# Patient Record
Sex: Female | Born: 1955 | Race: Black or African American
Health system: Southern US, Community
[De-identification: ages and names within clinical notes are randomized; demographics above are authoritative.]

## PROBLEM LIST (undated history)

## (undated) DIAGNOSIS — I739 Peripheral vascular disease, unspecified: Secondary | ICD-10-CM

## (undated) DIAGNOSIS — E669 Obesity, unspecified: Secondary | ICD-10-CM

## (undated) DIAGNOSIS — J449 Chronic obstructive pulmonary disease, unspecified: Secondary | ICD-10-CM

## (undated) DIAGNOSIS — I10 Essential (primary) hypertension: Secondary | ICD-10-CM

## (undated) DIAGNOSIS — E785 Hyperlipidemia, unspecified: Secondary | ICD-10-CM

## (undated) DIAGNOSIS — E119 Type 2 diabetes mellitus without complications: Secondary | ICD-10-CM

## (undated) HISTORY — DX: Peripheral vascular disease, unspecified: I73.9

## (undated) HISTORY — PX: TUNNELED CATH CHECK/CHANGE: IMG2663

## (undated) HISTORY — DX: Obesity, unspecified: E66.9

## (undated) HISTORY — PX: EXPLORATORY LAPAROTOMY: SHX4079

## (undated) HISTORY — PX: ESOPHAGOGASTRODUODENOSCOPY (EGD), BIOPSY: SHX3796

## (undated) HISTORY — PX: TUNNELED CATH PLACEMENT: IMG2664

## (undated) HISTORY — PX: JOINT REPLACEMENT: SHX530

## (undated) HISTORY — PX: OVARY SURGERY: SHX727

## (undated) HISTORY — PX: REMOVAL, FOREIGN BODY: SHX5112

---

## 2002-12-03 ENCOUNTER — Observation Stay
Admission: EM | Admit: 2002-12-03 | Disposition: A | Discharge status: Home / Self Care | Payer: Self-pay | Source: Emergency Department | Admitting: Internal Medicine

## 2003-09-06 ENCOUNTER — Emergency Department: Admit: 2003-09-06 | Payer: Self-pay | Source: Emergency Department | Admitting: Emergency Medicine

## 2005-03-21 ENCOUNTER — Emergency Department: Admit: 2005-03-21 | Payer: Self-pay | Source: Emergency Department | Admitting: Pediatric Emergency Medicine

## 2005-03-27 ENCOUNTER — Emergency Department: Admit: 2005-03-27 | Payer: Self-pay | Source: Emergency Department | Admitting: Emergency Medicine

## 2005-03-27 LAB — URINALYSIS WITH MICROSCOPIC
Bilirubin, UA: NEGATIVE
Blood, UA: NEGATIVE
Glucose, UA: >1000
Ketones UA: NEGATIVE
Leukocyte Esterase, UA: NEGATIVE
Nitrite, UA: NEGATIVE
Protein, UR: NEGATIVE
Specific Gravity UA POCT: 1.035 — ABNORMAL HIGH (ref 1.005–1.030)
Squamous Epithelial Cells, Urine: 2 /LPF — ABNORMAL HIGH (ref 0–0)
Urine pH: 6 (ref 4.6–8.0)
Urobilinogen, UA: 0.2 EU/dL (ref 0.2–1.0)
WBC, UA: 1 /HPF (ref 0–5)

## 2005-03-27 LAB — COMPREHENSIVE METABOLIC PANEL - AH CERNER
ALT: 18 U/L (ref 0–31)
AST (SGOT): 10 U/L (ref 0–31)
Albumin/Globulin Ratio: 1.4 (ref 1.1–2.2)
Albumin: 3.7 g/dL (ref 3.4–4.8)
Alkaline Phosphatase: 109 U/L — ABNORMAL HIGH (ref 35–104)
Anion Gap: 9 mEq/L (ref 5–15)
BUN: 11 mg/dL (ref 6–20)
Bilirubin, Total: 0.2 mg/dL (ref 0.0–1.0)
CA: 3.6 mEq/L — ABNORMAL LOW (ref 3.8–4.6)
CO2: 24.1 mEq/L (ref 22.0–29.0)
Calcium: 7.7 mg/dL — ABNORMAL LOW (ref 8.4–10.2)
Chloride: 100 mEq/L (ref 96–108)
Creatinine: 0.6 mg/dL (ref 0.4–1.1)
Globulin: 2.7 g/dL (ref 2.0–3.6)
Glucose: 367 mg/dL
Osmolality Calculated: 290 mosm/kg (ref 282–298)
Potassium: 4.1 mEq/L (ref 3.3–5.1)
Protein, Total: 6.4 g/dL (ref 6.4–8.3)
Sodium: 133 mEq/L — ABNORMAL LOW (ref 135–145)
UN/CREA SOFT: 18 RATIO (ref 6–33)

## 2005-03-27 LAB — CBC WITH AUTO DIFFERENTIAL CERNER
Basophils Absolute: 0 /mm3 (ref 0.0–0.2)
Basophils: 1 % (ref 0–2)
Eosinophils Absolute: 0.1 /mm3 (ref 0.0–0.2)
Eosinophils: 1 % (ref 0–5)
Granulocytes Absolute: 4 /mm3 (ref 1.8–8.1)
Hematocrit: 30.4 % — ABNORMAL LOW (ref 37.0–47.0)
Hgb: 10.1 G/DL — ABNORMAL LOW (ref 13.0–17.0)
Lymphocytes Absolute: 1.9 /mm3 (ref 0.5–4.4)
Lymphocytes: 29 % (ref 15–41)
MCH: 26.9 PG — ABNORMAL LOW (ref 28.0–32.0)
MCHC: 33.4 G/DL (ref 32.0–36.0)
MCV: 80.5 FL (ref 80.0–100.0)
MPV: 10.5 FL — ABNORMAL HIGH (ref 7.4–10.4)
Monocytes Absolute: 0.5 /mm3 (ref 0.0–1.2)
Monocytes: 8 % (ref 0–11)
Neutrophils %: 62 % (ref 52–75)
Platelets: 178 /mm3 (ref 140–400)
RBC: 3.77 /mm3 — ABNORMAL LOW (ref 4.20–5.40)
RDW: 13.8 % (ref 11.5–15.0)
WBC: 6.5 /mm3 (ref 3.5–10.8)

## 2005-03-27 LAB — RAPID INFLUENZA A/B ANTIGENS: Influenza Ag: NEGATIVE

## 2005-03-27 LAB — HEMOLYSIS INDEX: Hemolysis Index: 1 Units

## 2005-03-27 LAB — GFR

## 2005-07-10 ENCOUNTER — Emergency Department: Admit: 2005-07-10 | Payer: Self-pay | Source: Emergency Department | Admitting: Emergency Medicine

## 2005-07-10 LAB — BASIC METABOLIC PANEL - AH CERNER
Anion Gap: 11 mEq/L (ref 5–15)
BUN: 7 mg/dL (ref 6–20)
CO2: 25.8 mEq/L (ref 22.0–29.0)
Calcium: 8.6 mg/dL (ref 8.4–10.2)
Chloride: 99 mEq/L (ref 96–108)
Creatinine: 0.8 mg/dL (ref 0.4–1.1)
Glucose: 362 mg/dL
Osmolality Calculated: 295 mosm/kg (ref 282–298)
Potassium: 3.8 mEq/L (ref 3.3–5.1)
Sodium: 136 mEq/L (ref 135–145)
UN/CREA SOFT: 9 RATIO (ref 6–33)

## 2005-07-10 LAB — CBC WITH AUTO DIFFERENTIAL CERNER
Basophils Absolute: 0 /mm3 (ref 0.0–0.2)
Basophils: 1 % (ref 0–2)
Eosinophils Absolute: 0 /mm3 (ref 0.0–0.2)
Eosinophils: 1 % (ref 0–5)
Granulocytes Absolute: 4 /mm3 (ref 1.8–8.1)
Hematocrit: 36.8 % — ABNORMAL LOW (ref 37.0–47.0)
Hgb: 11.8 G/DL — ABNORMAL LOW (ref 12.0–16.0)
Lymphocytes Absolute: 1.3 /mm3 (ref 0.5–4.4)
Lymphocytes: 23 % (ref 15–41)
MCH: 25.9 PG — ABNORMAL LOW (ref 28.0–32.0)
MCHC: 32 G/DL (ref 32.0–36.0)
MCV: 80.8 FL (ref 80.0–100.0)
MPV: 10.8 FL — ABNORMAL HIGH (ref 7.4–10.4)
Monocytes Absolute: 0.4 /mm3 (ref 0.0–1.2)
Monocytes: 7 % (ref 0–11)
Neutrophils %: 69 % (ref 52–75)
Platelets: 191 /mm3 (ref 140–400)
RBC: 4.55 /mm3 (ref 4.20–5.40)
RDW: 14.9 % (ref 11.5–15.0)
WBC: 5.7 /mm3 (ref 3.5–10.8)

## 2005-07-10 LAB — LIPASE: Lipase: 30 U/L (ref 13–60)

## 2005-07-10 LAB — AMYLASE: Amylase: 46 U/L (ref 20–148)

## 2005-07-10 LAB — HCG QUANTITATIVE: hCG, Quant.: <0.1 m[IU]/mL (ref 0–9)

## 2005-07-10 LAB — HEMOLYSIS INDEX: Hemolysis Index: 32 Units

## 2005-07-10 LAB — GFR

## 2005-12-17 ENCOUNTER — Emergency Department: Admit: 2005-12-17 | Payer: Self-pay | Source: Emergency Department | Admitting: Emergency Medicine

## 2005-12-17 LAB — URINALYSIS WITH MICROSCOPIC
Bilirubin, UA: NEGATIVE
Blood, UA: NEGATIVE
Glucose, UA: 500
Leukocyte Esterase, UA: NEGATIVE
Nitrite, UA: NEGATIVE
Protein, UR: NEGATIVE
RBC, UA: 5 /HPF — ABNORMAL HIGH (ref 0–3)
Specific Gravity UA POCT: 1.033 — ABNORMAL HIGH (ref 1.005–1.030)
Squamous Epithelial Cells, Urine: 8 /LPF — ABNORMAL HIGH (ref 0–0)
Urine pH: 5 (ref 4.6–8.0)
Urobilinogen, UA: 2 EU/dL — ABNORMAL HIGH (ref 0.2–1.0)
WBC, UA: 3 /HPF (ref 0–5)

## 2005-12-17 LAB — CBC WITH AUTO DIFFERENTIAL CERNER
Basophils Absolute: 0 /mm3 (ref 0.0–0.2)
Basophils: 0 % (ref 0–2)
Eosinophils Absolute: 0.1 /mm3 (ref 0.0–0.2)
Eosinophils: 1 % (ref 0–5)
Granulocytes Absolute: 3.6 /mm3 (ref 1.8–8.1)
Hematocrit: 32.8 % — ABNORMAL LOW (ref 37.0–47.0)
Hgb: 11.6 G/DL — ABNORMAL LOW (ref 12.0–16.0)
Lymphocytes Absolute: 1.9 /mm3 (ref 0.5–4.4)
Lymphocytes: 31 % (ref 15–41)
MCH: 30.1 PG (ref 28.0–32.0)
MCHC: 35.3 G/DL (ref 32.0–36.0)
MCV: 85.3 FL (ref 80.0–100.0)
MPV: 11 FL — ABNORMAL HIGH (ref 7.4–10.4)
Monocytes Absolute: 0.6 /mm3 (ref 0.0–1.2)
Monocytes: 9 % (ref 0–11)
Neutrophils %: 59 % (ref 52–75)
Platelets: 205 /mm3 (ref 140–400)
RBC: 3.85 /mm3 — ABNORMAL LOW (ref 4.20–5.40)
RDW: 13.5 % (ref 11.5–15.0)
WBC: 6.1 /mm3 (ref 3.5–10.8)

## 2005-12-17 LAB — WET PREP, AH CERNER

## 2005-12-17 LAB — GFR

## 2005-12-17 LAB — BASIC METABOLIC PANEL - AH CERNER
Anion Gap: 13 mEq/L (ref 5–15)
BUN: 12 mg/dL (ref 6–20)
CO2: 22.8 mEq/L (ref 22.0–29.0)
Calcium: 8.9 mg/dL (ref 8.4–10.2)
Chloride: 99 mEq/L (ref 96–108)
Creatinine: 0.8 mg/dL (ref 0.4–1.1)
Glucose: 247 mg/dL
Osmolality Calculated: 288 mosm/kg (ref 282–298)
Potassium: 4.3 mEq/L (ref 3.3–5.1)
Sodium: 135 mEq/L (ref 135–145)
UN/CREA SOFT: 15 RATIO (ref 6–33)

## 2005-12-17 LAB — HEMOLYSIS INDEX: Hemolysis Index: 82 Units

## 2006-03-18 ENCOUNTER — Observation Stay
Admission: AD | Admit: 2006-03-18 | Disposition: A | Discharge status: Home / Self Care | Payer: Self-pay | Source: Emergency Department | Admitting: Internal Medicine

## 2006-03-18 LAB — COMPREHENSIVE METABOLIC PANEL
ALT: 25 U/L (ref 9–52)
AST (SGOT): 18 U/L (ref 8–39)
Albumin/Globulin Ratio: 1.2 (ref 1.1–1.8)
Albumin: 3.4 G/DL — ABNORMAL LOW (ref 3.7–5.1)
Alkaline Phosphatase: 88 U/L (ref 43–122)
BUN: 9 MG/DL (ref 7–21)
Bilirubin, Total: 0.2 MG/DL (ref 0.2–1.3)
CO2: 25 MEQ/L (ref 22–31)
Calcium: 8.6 MG/DL (ref 8.6–10.2)
Chloride: 104 MEQ/L (ref 98–107)
Creatinine: 0.6 MG/DL (ref 0.5–1.4)
Globulin: 2.9 G/DL (ref 2.0–3.7)
Glucose: 298 MG/DL — ABNORMAL HIGH (ref 70–105)
Potassium: 3.6 MEQ/L (ref 3.6–5.0)
Protein, Total: 6.3 G/DL (ref 6.0–8.0)
Sodium: 138 MEQ/L (ref 136–143)

## 2006-03-18 LAB — CBC WITH AUTO DIFFERENTIAL CERNER
Basophils Absolute: 0 /mm3 (ref 0.0–0.2)
Basophils: 1 % (ref 0–2)
Eosinophils Absolute: 0 /mm3 (ref 0.0–0.7)
Eosinophils: 1 % (ref 0–5)
Granulocytes Absolute: 2.2 /mm3 (ref 1.8–8.1)
Hematocrit: 30.1 % — ABNORMAL LOW (ref 37.0–47.0)
Hgb: 9.8 G/DL — ABNORMAL LOW (ref 13.0–17.0)
Lymphocytes Absolute: 1.3 /mm3 (ref 0.5–4.4)
Lymphocytes: 34 % (ref 15–41)
MCH: 25 PG — ABNORMAL LOW (ref 28.0–32.0)
MCHC: 32.5 G/DL (ref 32.0–36.0)
MCV: 76.8 FL — ABNORMAL LOW (ref 80.0–100.0)
MPV: 9.9 FL (ref 7.4–10.4)
Monocytes Absolute: 0.3 /mm3 (ref 0.0–1.2)
Monocytes: 9 % (ref 0–11)
Neutrophils %: 56 % (ref 52–75)
Platelets: 195 /mm3 (ref 140–400)
RBC: 3.92 /mm3 — ABNORMAL LOW (ref 4.20–5.40)
RDW: 13.9 % (ref 11.5–15.0)
WBC: 3.9 /mm3 (ref 3.5–10.8)

## 2006-03-18 LAB — HEPATIC FUNCTION PANEL
ALT: 18 U/L (ref 9–52)
AST (SGOT): 17 U/L (ref 8–39)
Albumin/Globulin Ratio: 1.1 (ref 1.1–1.8)
Albumin: 3.4 G/DL — ABNORMAL LOW (ref 3.7–5.1)
Alkaline Phosphatase: 90 U/L (ref 43–122)
Bilirubin Direct: 0 MG/DL — AB (ref 0.0–0.3)
Bilirubin Indirect: 0.2 MG/DL (ref 0.0–1.1)
Bilirubin, Total: 0.2 MG/DL (ref 0.2–1.3)
Globulin: 3 G/DL (ref 2.0–3.7)
Protein, Total: 6.4 G/DL (ref 6.0–8.0)

## 2006-03-18 LAB — GFR

## 2006-03-18 LAB — TROPONIN I QUANTITATIVE LEVEL CERNER: Troponin I: 0.01 ng/mL (ref 0.00–0.03)

## 2006-03-18 LAB — CREATINE KINASE W/O REFLEX (SOFT): Creatine Kinase (CK): 84 U/L (ref 20–140)

## 2006-03-18 LAB — PT AND APTT
PT INR: 0.9 {INR} (ref 0.9–1.1)
PT: 11.3 s (ref 10.8–13.3)
PTT: 25 s (ref 21–32)

## 2006-03-18 LAB — CALCIUM IONIZED-CALC. CERNER: Calcium Ionized Calculated: 2 mEQ/L (ref 1.9–2.3)

## 2006-03-18 LAB — URINE HCG QUALITATIVE: Urine HCG Qualitative: NEGATIVE

## 2006-03-18 LAB — AMYLASE: Amylase: 29 U/L — ABNORMAL LOW (ref 30–110)

## 2006-03-18 LAB — LIPASE: Lipase: 113 U/L (ref 23–300)

## 2006-03-20 LAB — BASIC METABOLIC PANEL
BUN: 9 MG/DL (ref 7–21)
CO2: 24 MEQ/L (ref 22–31)
Calcium: 8.1 MG/DL — ABNORMAL LOW (ref 8.6–10.2)
Chloride: 106 MEQ/L (ref 98–107)
Creatinine: 0.6 MG/DL (ref 0.5–1.4)
Glucose: 190 MG/DL — ABNORMAL HIGH (ref 70–105)
Potassium: 3.6 MEQ/L (ref 3.6–5.0)
Sodium: 136 MEQ/L (ref 136–143)

## 2006-03-20 LAB — CBC- CERNER
Hematocrit: 30.2 % — ABNORMAL LOW (ref 37.0–47.0)
Hgb: 9.8 G/DL — ABNORMAL LOW (ref 13.0–17.0)
MCH: 25 PG — ABNORMAL LOW (ref 28.0–32.0)
MCHC: 32.4 G/DL (ref 32.0–36.0)
MCV: 77.2 FL — ABNORMAL LOW (ref 80.0–100.0)
MPV: 9.5 FL (ref 7.4–10.4)
Platelets: 205 /mm3 (ref 140–400)
RBC: 3.91 /mm3 — ABNORMAL LOW (ref 4.20–5.40)
RDW: 13.9 % (ref 11.5–15.0)
WBC: 6.3 /mm3 (ref 3.5–10.8)

## 2006-03-20 LAB — GFR

## 2007-12-15 ENCOUNTER — Emergency Department: Payer: Self-pay

## 2008-10-27 ENCOUNTER — Ambulatory Visit: Payer: Self-pay

## 2008-11-06 ENCOUNTER — Ambulatory Visit: Payer: Self-pay | Admitting: Adult Health

## 2008-12-02 ENCOUNTER — Ambulatory Visit: Payer: Self-pay

## 2009-03-29 ENCOUNTER — Emergency Department: Payer: Self-pay | Admitting: Emergency Medicine

## 2009-05-05 ENCOUNTER — Emergency Department: Payer: Self-pay | Admitting: Emergency Medicine

## 2009-06-14 ENCOUNTER — Ambulatory Visit: Payer: Self-pay | Admitting: Family Medicine

## 2010-01-06 ENCOUNTER — Emergency Department: Payer: Self-pay | Admitting: Emergency Medicine

## 2010-10-29 LAB — ECG 12-LEAD
Atrial Rate: 56 {beats}/min
P Axis: 62 degrees
P-R Interval: 128 ms
Q-T Interval: 520 ms
QRS Duration: 82 ms
QTC Calculation (Bezet): 501 ms
R Axis: 44 degrees
T Axis: 28 degrees
Ventricular Rate: 56 {beats}/min
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
## 2010-11-10 NOTE — Consults (Signed)
ATTENDING MD:      Gary Fleet MD: Lissa Morales, MD      ADMITTED:      03/18/2006      CONSULTED:     03/19/2006      ROOM:          3B  335 02            REASON FOR CONSULTATION:  Acute vertigo.            HISTORY OF PRESENT ILLNESS:  This patient is a 55 year old right-handed      African-American female with a past medical history of type 2 diabetes for      several years, history of hypertension, admitted with 2 days of vertigo,      described as spinning associated with head changes, worsened with      positional changes, associated with nausea.  The patient states that it is      not getting better.  She denies any headache, denies passing out, reports      paresthesias in her lower extremities for some time.  The patient denies      recent illness but admits some diarrhea.  There is also a history of chest      pain off and on, some questionable heart disease.            PAST MEDICAL HISTORY:  Significant for diabetes mellitus, hypertension.            SOCIAL HISTORY:  Denies smoking, drinking.            FAMILY HISTORY:  Significant for diabetes mellitus.            MEDICATIONS:  The patient is on meclizine and metformin, phenergan.            PHYSICAL EXAMINATION:  Her blood pressure is 112/61, pulse is 61.      Neurological examination:  Patient oriented x3.  She denies recent and      remote memory problems.  Denies any cognitive issues.  She has good      comprehension and concentration.  Able to register and name well.  Cranial      nerves examination, 2 mm and reactive.  Extraocular movements intact.      Visual fields are full.  Face symmetric. Tongue and uvula are midline.      Sensation intact.  Motor strength is 5/5 except in the iliopsoas, lower      extremity 4/5, and subtle weakness in the rest of the muscles in the lower      extremities.  Sensory intact to pinprick, temperature, vibration,      proprioception.  Finger-to-nose, not abnormal, no ataxia.  The patient has      difficulty to walk  because of dizziness.  Reflexes are 1 to 2+ and      symmetric.  Ankle reflexes are depressed.            DIAGNOSTIC STUDIES:  A CT scan of the brain showed that she has some      possible pneumocephalus either from artifactual or air in the venous      structure which would make it of no clinical significance.  Followup      examination is suggested.  Laboratory workup shows that she has WBC of 3.9,      hemoglobin of 9.8, hematocrit of 30.1.  Sodium 138, potassium 3.6, chloride      104, CO2 of 25, BUN and  creatinine is 9 and 0.6, glucose is 298.  ALT and      AST is 88 and 18.  Lipase is 139.  INR, PT, PTT is normal.  Head CT is      negative.            ASSESSMENT AND PLAN:  This is a 55 year old female with a past medical      history of diabetes, hypertension.  Patient presented with 2 days of acute      onset of vertigo described as spinning associated with positional changes,      associated with nausea, denies passing out, admits to some paresthesias in      the lower extremities for some time.  The patient also reports some      diarrhea.            IMPRESSION:  Benign positional vertigo versus vertebrobasilar      insufficiency.  We will do workup for a stroke.  We will get an MRI and MRA      of the head and neck to rule out ischemic structure lesion, looking at      vertebrobasilar circulation.  We will check thyroid stimulating hormone, B      12, and rapid plasma reagin.  Also, we will get physical therapy evaluation      and an echocardiogram, Holter monitoring.  We will put her on aspirin and      have a workup for her cardiac problems.  We will continue with deep venous      thrombosis prophylaxis and will get a lipid profile, also start a stroke      workup.  We will continue to follow.            Thanks for giving [place] an opportunity to participate in the care of this      patient.                                          Electronic Signing MD: Lissa Morales, MD                  D 03/19/2006  1:01 P;  T 03/19/2006  1:51 P; [id number] - - , [id number], [id number]      CC:  Lissa Morales, MD

## 2010-12-06 ENCOUNTER — Emergency Department: Payer: Self-pay | Admitting: Emergency Medicine

## 2011-10-12 ENCOUNTER — Emergency Department: Payer: Self-pay | Admitting: Emergency Medicine

## 2011-10-12 LAB — BASIC METABOLIC PANEL
Calcium, Total: 9.1 mg/dL (ref 8.5–10.1)
Chloride: 106 mmol/L (ref 98–107)
Co2: 28 mmol/L (ref 21–32)
EGFR (Non-African Amer.): 47 — ABNORMAL LOW
Osmolality: 289 (ref 275–301)
Potassium: 4.3 mmol/L (ref 3.5–5.1)
Sodium: 141 mmol/L (ref 136–145)

## 2011-10-12 LAB — CBC
HCT: 33 % — ABNORMAL LOW (ref 35.0–47.0)
HGB: 11.1 g/dL — ABNORMAL LOW (ref 12.0–16.0)
MCHC: 33.6 g/dL (ref 32.0–36.0)
Platelet: 156 10*3/uL (ref 150–440)
RBC: 3.75 10*6/uL — ABNORMAL LOW (ref 3.80–5.20)

## 2011-11-27 ENCOUNTER — Ambulatory Visit: Payer: Self-pay | Admitting: Internal Medicine

## 2011-12-20 ENCOUNTER — Emergency Department: Payer: Self-pay | Admitting: Emergency Medicine

## 2012-01-16 NOTE — Procedures (Unsigned)
PATIENT:          [name], [name]      CARDIOLOGIST: Zeinab Rodwell. Clarita Crane, MD      REFERRING PHYSICIAN:      MEDICAL RECORD NUMBER:    [id number]      DATE OF STUDY:    12/04/2002      PATIENT LOCATION/SERVICE: [facility] 12/04/2002                  AGE:                                   PREVIOUS STUDY:      DRUGS:      INDICATIONS FOR STUDY:      BASELINE EKG:      PREDICTED MAXIMUM HR:              STAGE    MAXIMUM      TIME        HR        BP     DYSRHYTHMIA ST CHANGES                 WORKLOAD   MINUTES                             S      I      II      III      IV      V      VI      RECOVERY      I      II      III            SYMPTOMS:      TEST TERMINATED DUE TO:      PEAK HEART     AFTER           MINUTES @       MPH  _______%   GRADE      RATE                                                           =_______METS                  PROCEDURE AND FINDINGS:  The patient underwent an exercise treadmill test.      She exercised for 8 minutes, 24 seconds.  The patient experienced chest      pain with 1 mm ST depression in V4, V5, and V6.  The patient achieved 10.1      METS.  She almost reached her target heart rate.            IMPRESSION:  Positive exercise treadmill stress test                  ___________________________________      Jerilynn Mages. Clarita Crane, MD            Keithon Mccoin Z:amg:addk      D:    12/04/2002      T:    12/04/2002      #:    [id number]      N:  [id number]            cc:   Peterson Mathey. Clarita Crane, MD

## 2012-01-16 NOTE — Procedures (Unsigned)
PATIENTLACHANDRA, [name]      MEDICAL RECORD NUMBER:         [id number]      PATIENT LOCATION/SERVICE:      [facility] 12/04/2002            DATE OF PROCEDURE:             12/04/2002      PHYSICIAN:                     Vonzell Schlatter, MD      REFERRING PHYSICIAN:            CLINICAL INDICATIONS:                                            [name]            3.8  cm      LA                                  (19-71mm)      3.4  cm      Ao Root                                   (19-27mm)      1.9  cm      Ao Leaflet Separation               (15-31mm)      ---   cm     RVd                                 (7-29mm)      2.7  cm      LVs                                 (20-4mm)      4.2  cm      LVd                                 (37-20mm)      0.8  cm      IVSd                                (6-77mm)      0.9  cm      LVPWd                               (6-52mm)      ---          Fractional Shortening               (25-45%)      65%          Est. EF                             (>  55%)            PEAK VELOCITY          PEAK              DOPPLER      DEGREE OF VALVULAR      VALVE                          GRADIENT         MEAN             REGURGITATION      AREA                                           GRADIENT      Mitral 0.8  [name]/s(0.6-1.3)                           Trace      Aortic  1.2  [name]/s(1.0-1.7)                              -------      Pulm.   1.2  [name]/s(0.6-0.9)                          -------      Tric.   0.5  [name]/s(0.3-0.7)                              Trace      LVOT   0.9  [name]/s(0.7-1.7)            DESCRIPTION OF FINDINGS:  The left ventricular chamber size is normal.  The      left ventricular systolic function is normal.  Ejection fraction is 65%.      The mitral valve shows trace regurgitation.  There is no evidence of mitral      stenosis.  The aortic valve appears to be normal.  The tricuspid valve      shows trace regurgitation.  The left atrial size is normal.  Right atrial      and  right ventricular sizes are normal.  There is no pericardial effusion.            CONCLUSIONS:      1. Normal cardiac chamber sizes.      2. Normal left ventricular systolic function.      3. Ejection fraction is 65%.      4. Trace mitral regurgitation.      5. Trace tricuspid regurgitation.            ___________________________________      Diane Mages. Diane Crane, MD            Suprina Mandeville Z:amw:MH      D:      12/04/2002      T:      12/05/2002      #:      [id number]      N:      [id number]            cc:     Voncille Lo, MD  Vonzell Schlatter, MD

## 2012-04-03 ENCOUNTER — Ambulatory Visit: Payer: Self-pay

## 2012-05-06 ENCOUNTER — Ambulatory Visit: Payer: Self-pay | Admitting: Family Medicine

## 2012-11-28 ENCOUNTER — Ambulatory Visit: Payer: Self-pay | Admitting: Family Medicine

## 2012-12-18 ENCOUNTER — Emergency Department: Payer: Self-pay | Admitting: Emergency Medicine

## 2012-12-18 LAB — COMPREHENSIVE METABOLIC PANEL
Anion Gap: 9 (ref 7–16)
BUN: 35 mg/dL — ABNORMAL HIGH (ref 7–18)
Bilirubin,Total: 0.6 mg/dL (ref 0.2–1.0)
Calcium, Total: 9.1 mg/dL (ref 8.5–10.1)
Chloride: 103 mmol/L (ref 98–107)
Creatinine: 1.73 mg/dL — ABNORMAL HIGH (ref 0.60–1.30)
EGFR (African American): 37 — ABNORMAL LOW
Glucose: 162 mg/dL — ABNORMAL HIGH (ref 65–99)
Osmolality: 287 (ref 275–301)
SGOT(AST): 21 U/L (ref 15–37)
Sodium: 138 mmol/L (ref 136–145)

## 2012-12-18 LAB — URINALYSIS, COMPLETE
Glucose,UR: 50 mg/dL (ref 0–75)
Ketone: NEGATIVE
Nitrite: NEGATIVE
Protein: 100
RBC,UR: 117 /HPF (ref 0–5)
Specific Gravity: 1.01 (ref 1.003–1.030)

## 2012-12-18 LAB — CBC
HCT: 34.6 % — ABNORMAL LOW (ref 35.0–47.0)
HGB: 11.2 g/dL — ABNORMAL LOW (ref 12.0–16.0)
MCH: 27.6 pg (ref 26.0–34.0)
MCHC: 32.3 g/dL (ref 32.0–36.0)
Platelet: 145 10*3/uL — ABNORMAL LOW (ref 150–440)
RBC: 4.05 10*6/uL (ref 3.80–5.20)
WBC: 11.3 10*3/uL — ABNORMAL HIGH (ref 3.6–11.0)

## 2012-12-18 LAB — CK TOTAL AND CKMB (NOT AT ARMC)
CK, Total: 100 U/L (ref 21–215)
CK-MB: <0.5 ng/mL — ABNORMAL LOW (ref 0.5–3.6)

## 2012-12-18 LAB — TROPONIN I: Troponin-I: <0.02 ng/mL

## 2012-12-18 LAB — LIPASE, BLOOD: Lipase: 179 U/L (ref 73–393)

## 2013-02-10 LAB — BASIC METABOLIC PANEL
Anion Gap: 7 (ref 7–16)
BUN: 20 mg/dL — AB (ref 7–18)
CALCIUM: 8.7 mg/dL (ref 8.5–10.1)
CO2: 24 mmol/L (ref 21–32)
CREATININE: 1.58 mg/dL — AB (ref 0.60–1.30)
Chloride: 106 mmol/L (ref 98–107)
GFR CALC AF AMER: 41 — AB
GFR CALC NON AF AMER: 36 — AB
Glucose: 169 mg/dL — ABNORMAL HIGH (ref 65–99)
Osmolality: 280 (ref 275–301)
Potassium: 3.5 mmol/L (ref 3.5–5.1)
SODIUM: 137 mmol/L (ref 136–145)

## 2013-02-10 LAB — TROPONIN I: Troponin-I: 0.07 ng/mL — ABNORMAL HIGH

## 2013-02-11 ENCOUNTER — Inpatient Hospital Stay: Payer: Self-pay | Admitting: Internal Medicine

## 2013-02-11 LAB — CBC
HCT: 34.6 % — AB (ref 35.0–47.0)
HGB: 11.2 g/dL — AB (ref 12.0–16.0)
MCH: 28.7 pg (ref 26.0–34.0)
MCHC: 32.5 g/dL (ref 32.0–36.0)
MCV: 88 fL (ref 80–100)
PLATELETS: 168 10*3/uL (ref 150–440)
RBC: 3.91 10*6/uL (ref 3.80–5.20)
RDW: 13.6 % (ref 11.5–14.5)
WBC: 4.3 10*3/uL (ref 3.6–11.0)

## 2013-02-11 LAB — RAPID INFLUENZA A&B ANTIGENS (ARMC ONLY)

## 2013-02-11 LAB — CK-MB
CK-MB: 0.5 ng/mL (ref 0.5–3.6)
CK-MB: <0.5 ng/mL — ABNORMAL LOW (ref 0.5–3.6)

## 2013-02-11 LAB — TROPONIN I
TROPONIN-I: 0.07 ng/mL — AB
Troponin-I: 0.08 ng/mL — ABNORMAL HIGH

## 2013-02-11 LAB — PRO B NATRIURETIC PEPTIDE: B-Type Natriuretic Peptide: 914 pg/mL — ABNORMAL HIGH (ref 0–125)

## 2013-02-12 LAB — LIPID PANEL
Cholesterol: 238 mg/dL — ABNORMAL HIGH (ref 0–200)
HDL: 40 mg/dL (ref 40–60)
Ldl Cholesterol, Calc: 143 mg/dL — ABNORMAL HIGH (ref 0–100)
TRIGLYCERIDES: 273 mg/dL — AB (ref 0–200)
VLDL Cholesterol, Calc: 55 mg/dL — ABNORMAL HIGH (ref 5–40)

## 2013-02-14 LAB — CBC WITH DIFFERENTIAL/PLATELET
BASOS ABS: 0 10*3/uL (ref 0.0–0.1)
Basophil %: 0 %
EOS ABS: 0 10*3/uL (ref 0.0–0.7)
Eosinophil %: 0 %
HCT: 29.8 % — ABNORMAL LOW (ref 35.0–47.0)
HGB: 9.7 g/dL — AB (ref 12.0–16.0)
LYMPHS ABS: 1 10*3/uL (ref 1.0–3.6)
LYMPHS PCT: 7.2 %
MCH: 28 pg (ref 26.0–34.0)
MCHC: 32.4 g/dL (ref 32.0–36.0)
MCV: 86 fL (ref 80–100)
MONO ABS: 0.6 x10 3/mm (ref 0.2–0.9)
MONOS PCT: 4.5 %
Neutrophil #: 11.8 10*3/uL — ABNORMAL HIGH (ref 1.4–6.5)
Neutrophil %: 88.3 %
Platelet: 193 10*3/uL (ref 150–440)
RBC: 3.45 10*6/uL — ABNORMAL LOW (ref 3.80–5.20)
RDW: 13.6 % (ref 11.5–14.5)
WBC: 13.4 10*3/uL — AB (ref 3.6–11.0)

## 2013-02-14 LAB — BASIC METABOLIC PANEL
ANION GAP: 6 — AB (ref 7–16)
BUN: 52 mg/dL — ABNORMAL HIGH (ref 7–18)
CALCIUM: 9 mg/dL (ref 8.5–10.1)
CHLORIDE: 101 mmol/L (ref 98–107)
CO2: 24 mmol/L (ref 21–32)
Creatinine: 1.94 mg/dL — ABNORMAL HIGH (ref 0.60–1.30)
EGFR (African American): 32 — ABNORMAL LOW
GFR CALC NON AF AMER: 28 — AB
Glucose: 327 mg/dL — ABNORMAL HIGH (ref 65–99)
Osmolality: 289 (ref 275–301)
Potassium: 4.7 mmol/L (ref 3.5–5.1)
SODIUM: 131 mmol/L — AB (ref 136–145)

## 2013-02-15 LAB — BASIC METABOLIC PANEL
ANION GAP: 6 — AB (ref 7–16)
BUN: 65 mg/dL — ABNORMAL HIGH (ref 7–18)
CO2: 23 mmol/L (ref 21–32)
Calcium, Total: 8.9 mg/dL (ref 8.5–10.1)
Chloride: 102 mmol/L (ref 98–107)
Creatinine: 1.99 mg/dL — ABNORMAL HIGH (ref 0.60–1.30)
EGFR (African American): 31 — ABNORMAL LOW
GFR CALC NON AF AMER: 27 — AB
GLUCOSE: 339 mg/dL — AB (ref 65–99)
Osmolality: 295 (ref 275–301)
POTASSIUM: 4.9 mmol/L (ref 3.5–5.1)
Sodium: 131 mmol/L — ABNORMAL LOW (ref 136–145)

## 2013-02-15 LAB — CBC WITH DIFFERENTIAL/PLATELET
Basophil #: 0 10*3/uL (ref 0.0–0.1)
Basophil %: 0.1 %
Eosinophil #: 0 10*3/uL (ref 0.0–0.7)
Eosinophil %: 0 %
HCT: 29.9 % — ABNORMAL LOW (ref 35.0–47.0)
HGB: 9.5 g/dL — AB (ref 12.0–16.0)
LYMPHS ABS: 0.6 10*3/uL — AB (ref 1.0–3.6)
Lymphocyte %: 5.1 %
MCH: 27.9 pg (ref 26.0–34.0)
MCHC: 32 g/dL (ref 32.0–36.0)
MCV: 87 fL (ref 80–100)
MONOS PCT: 3.4 %
Monocyte #: 0.4 x10 3/mm (ref 0.2–0.9)
NEUTROS ABS: 10.8 10*3/uL — AB (ref 1.4–6.5)
Neutrophil %: 91.4 %
Platelet: 194 10*3/uL (ref 150–440)
RBC: 3.42 10*6/uL — ABNORMAL LOW (ref 3.80–5.20)
RDW: 13.7 % (ref 11.5–14.5)
WBC: 11.8 10*3/uL — ABNORMAL HIGH (ref 3.6–11.0)

## 2013-02-17 LAB — CREATININE, SERUM
Creatinine: 1.95 mg/dL — ABNORMAL HIGH (ref 0.60–1.30)
EGFR (African American): 32 — ABNORMAL LOW
EGFR (Non-African Amer.): 28 — ABNORMAL LOW

## 2013-02-17 LAB — WBC: WBC: 10.7 10*3/uL (ref 3.6–11.0)

## 2013-02-18 LAB — PLATELET COUNT: Platelet: 205 10*3/uL (ref 150–440)

## 2013-04-02 ENCOUNTER — Ambulatory Visit: Payer: Self-pay | Admitting: Nephrology

## 2014-05-16 NOTE — Discharge Summary (Signed)
PATIENT NAME:  Donna Nelson, Donna Nelson MR#:  [id number] DATE OF BIRTH:  19-Apr-1955  DATE OF ADMISSION:  02/11/2013 DATE OF DISCHARGE:  02/19/2013   DISCHARGE DIAGNOSES:  1. Chest pain secondary to bronchitis.  2. Chronic obstructive pulmonary disease exacerbation.  3. Diabetes mellitus type 2, uncontrolled blood sugar secondary to steroids.  4. Hypertension.  5. Chronic kidney disease stage III.  6. The patient needs outpatient stress test because of chest pain and other risk factors.   DISCHARGE MEDICATIONS:  1. Aspirin 81 mg daily.  2. Glipizide 10 mg 2 tablets p.o. b.i.d. 3. Gemfibrozil 600 mg 1 tablet p.o. b.i.d.  4. Omeprazole 20 mg p.o. b.i.d. 5. Lovastatin 40 mg p.o. daily. 6. Hydrochlorothiazide 25 mg p.o. daily.  7. ProAir 2 puffs every 4 to 6 hours as needed. 8. Combigan 2 drops in affected eye every 12 hours.  9. Hydroxyzine hydrochloride 25 mg every 4 to 6 hours as needed.  10. Lasix 20 mg p.o. daily.  11. Insulin Lantus 105 units subcutaneous at bedtime.  12. Prednisone 20 mg 3 tablets daily for 2 days, 2 tablets daily for 2 days and 1 tablet daily for 2 days.  13. Azithromycin 500 mg daily for 4 days.  14. Tussionex 5 mg every 12 hours, dispense only 70 mL.   15. Amlodipine 10 mg p.o. daily.  16. DuoNebs every 6 hours p.r.n. for wheezing.  17. Augmentin 875 mg p.o. b.i.d. for 10 days.   CONSULTATIONS: None.   HOSPITAL COURSE:  1. Chest pain. The patient is a 59 year old female with multiple medical problems of diabetes, hypertension, hyperlipidemia, GERD, who came in because of chest pain. The patient's primary doctor is Dr. Darrick Huntsmanullo. The patient admitted to observation initially, started on aspirin, beta blockers. The patient had a stress test which showed no significant ischemia, and the patient's nuclear medicine scan did not show any abnormality. LDL was 143. She was started already on statins along with gemfibrozil as her triglycerides were 273. The patient is to continue  her home medications for her blood pressure, including Lasix, Norvasc and also her hydrochlorothiazide along with aspirin.  2. Diabetes mellitus type 2. The patient's sugars have been high because of the steroids. The patient is right now on a little bit higher dose of Lantus at 105 units a day at night, so I told her to check blood sugars at home, and probably she can call the nurse at Dr. Melina Schoolsullo's office and give the readings and adjust the insulin accordingly.  3. Chronic obstructive pulmonary disease exacerbation and bronchitis. The patient's chest x-rays repeatedly were negative for pneumonia, but she had a very bad cough, and the patient continued on IV steroids along with antibiotics. O2 saturations are like 98% on room air. The patient does have a really significant cough, especially when she talks, so I told her to continue Tussionex and tapering course of prednisone. She has a nebulizer that she can continue, and see Dr. Darrick Huntsmanullo for followup.  4. The patient does have CKD stage III, baseline creatinine is around 1.8. She is supposed to see Dr. Mady HaagensenMunsoor Lateef, and she can follow up with them.  5. The patient has still elevated blood sugar of 340s, but I did decrease the steroids, so hopefully the extra dose of Lantus will help.   TIME SPENT ON DISCHARGE PREPARATION: Took more than 30 minutes.   ____________________________ Katha HammingSnehalatha Jeniece Hannis, MD sk:lb D: 02/19/2013 12:05:05 ET T: 02/19/2013 12:34:24 ET JOB#: [id number]  cc: Katha HammingSnehalatha Islam Villescas, MD, <Dictator>  Duncan Dull, MD Katha Hamming MD ELECTRONICALLY SIGNED 03/03/2013 15:21

## 2014-05-16 NOTE — H&P (Signed)
PATIENT NAME:  Donna Nelson, Donna Nelson MR#:  [id number] DATE OF BIRTH:  03-26-1955  DATE OF ADMISSION:  02/10/2013  REFERRING PHYSICIAN: Dr. Bayard Males.   PRIMARY CARE PHYSICIAN: Dr. Darrick Huntsman   CHIEF COMPLAINT:  Chest pain.    HISTORY OF PRESENT ILLNESS:  A 59 year old African American female with past medical history of hypertension, type 2 diabetes insulin requiring, hyperlipidemia, gastroesophageal reflux disease presenting with chest pain. She describes 1 day duration of chest pain, sharp and burning in quality, retrosternal in location, nonradiating, 7 out of 10 in intensity, worse with cough. She found no relieving factors. No associated shortness of breath, palpitations, diaphoresis or nauseousness.  The patient also of note, has had shortness of breath for approximately 1 week in duration described mainly as dyspnea on exertion with productive cough and greenish sputum. Denies fevers, chills. No known sick contacts. Denies any orthopnea, edema, PND. She received aspirin in the Emergency Department, and started on supplemental O2. Currently without complaints.   REVIEW OF SYSTEMS:  CONSTITUTIONAL: Denies fever. Positive for generalized fatigue and weakness. Denies pain.  EYES: Denies blurred vision, double vision, eye pain.  EARS, NOSE, THROAT: Denies tinnitus, ear pain, hearing loss.  RESPIRATORY: Positive for cough and shortness of breath as described above.  CARDIOVASCULAR: Positive for chest pain as described above; however, denies any orthopnea, edema, palpitations.  GASTROINTESTINAL: Denies nausea, vomiting, diarrhea, abdominal pain.  GENITOURINARY: Denies dysuria, hematuria.  ENDOCRINE: Denies nocturia or thyroid problems.  HEMATOLOGIC AND LYMPHATIC: Denies easy bruising, bleeding.  SKIN: Denies rash or lesions.  MUSCULOSKELETAL: Denies pain in neck, back, shoulder, knees, hips, arthritic symptoms.  NEUROLOGIC: Denies paralysis, paresthesias.  PSYCHIATRIC: Denies anxiety or depressive  symptoms.  Otherwise, full review of systems performed by me is negative.   PAST MEDICAL HISTORY: Type 2 diabetes insulin requiring, hyperlipidemia,  gastroesophageal reflux disease, hypertension.   SOCIAL HISTORY: Denies any alcohol, tobacco or drug usage.   FAMILY HISTORY: Positive for diabetes, hypertension.   ALLERGIES: LISINOPRIL AND METFORMIN.   HOME MEDICATIONS: Include Norvasc 5 mg p.o. daily, aspirin 81 mg p.o. daily, Combigan  0.2/0.5% ophthalmic solution 2 drops to each affected eye twice daily, gemfibrozil 600 mg p.o. b.i.d., glipizide 10 mg 2 tablets p.o. b.i.d., hydrochlorothiazide 25 mg p.o. daily, hydroxyzine 25 mg every 6 hours as needed for itching, Lantus 76 units subcutaneous daily, losartan 50 mg p.o. daily, lovastatin 400 mg p.o. daily, metoprolol 50 mg p.o. b.i.d., Prilosec 20 mg p.o. b.i.d., ProAir 90 mcg inhalation 2 puffs every 6 hours needed for shortness of breath.   PHYSICAL EXAMINATION: VITAL SIGNS: Temperature 98, heart rate 70, respirations 26, blood pressure 178/81, saturating 95% on room air. Weight 114.3 kg, BMI 46.1.  GENERAL: Well-nourished, well-developed, obese, African American female, currently in no acute distress.  HEAD: Normocephalic, atraumatic.  EYES: Pupils equal, round and reactive to light.  Extraocular muscles intact. No scleral icterus.  MOUTH: Moist mucosal membranes. Dentition intact. No abscess noted.  EARS, NOSE AND THROAT: Clear without exudates. No external lesions.  NECK: Supple. No thyromegaly. No nodules. No JVD.  PULMONARY: Scant expiratory wheezes noted throughout all lung fields. No use of accessory muscles. Good respiratory effort.  CHEST: Nontender to palpation.  CARDIOVASCULAR: S1, S2, regular rate and rhythm. No murmurs, rubs, or gallops. No edema. Pedal pulses 2+ bilaterally. GASTROINTESTINAL: Soft, nontender, nondistended. No masses. Positive bowel sounds. No hepatosplenomegaly.  MUSCULOSKELETAL: No swelling, clubbing,  edema. Range of motion full in all extremities. NEUROLOGIC:  Cranial nerves II through XII intact. No  gross focal neurological deficits. Sensation intact. Reflexes intact.  SKIN: No ulcerations, lesions, rashes, cyanosis. Skin warm, dry, turgor intact.  PSYCHIATRIC:  Mood and affect within normal limits.  awake, oriented x 3. Insight and judgment intact.   LABORATORY DATA: EKG revealing normal sinus rhythm, heart rate 75. No ST or T wave abnormalities. Chest x-ray performed revealing no acute cardiopulmonary process. Remainder of  laboratory data: Sodium 137, potassium 3.5, chloride 106, bicarbonate 24, BUN 20, creatinine 1.58, glucose 169, BNP 914, troponin 0.07. WBC 4.3, hemoglobin 11.2, platelets 168.   ASSESSMENT AND PLAN: A 59 year old [place] female with history of hypertension, diabetes and hyperlipidemia presenting with chest pain, shortness of breath.   1.  Chest pain. Admit to telemetry under observation. Provide statin and aspirin therapy. Cardiac enzymes x 3. Her initial cardiac enzyme is positive though suspect acute kidney injury is playing a role.  2.  Shortness of breath/bronchitis. Provide azithromycin as symptoms have been prolonged. DuoNeb therapy q. 4 hours.  supplemental O2 to keep O2 saturation greater than 92%. Flutter valve therapy.  3.  Acute kidney injury. IV fluid hydration with normal saline and follow renal function and urine output.  4.  Diabetes type 2. Hold p.o. agents. Add insulin sliding scale, q. 6 hour Accu-Cheks. Continue with Lantus.  5.  Venous thromboembolism prophylaxis with heparin subacute.   The patient is FULL CODE.   TIME SPENT: 45 minutes    ____________________________ Cletis Athensavid K. Hower, MD dkh:dp D: 02/11/2013 02:25:40 ET T: 02/11/2013 06:43:18 ET JOB#: [id number]  cc: Cletis Athensavid K. Hower, MD, <Dictator> DAVID Synetta ShadowK HOWER MD ELECTRONICALLY SIGNED 02/11/2013 20:27

## 2014-10-06 IMAGING — CR DG CHEST 2V
1 series · 2 of 2 positions shown · non-contrast
Comparison: DG CHEST 2V dated 02/10/2013

CLINICAL DATA: Dyspnea cough with history of bronchitis

EXAM:
CHEST  2 VIEW

[Series 1: lat · 0.17mm/px · 2 of 2 slices shown]
[im 1/2]
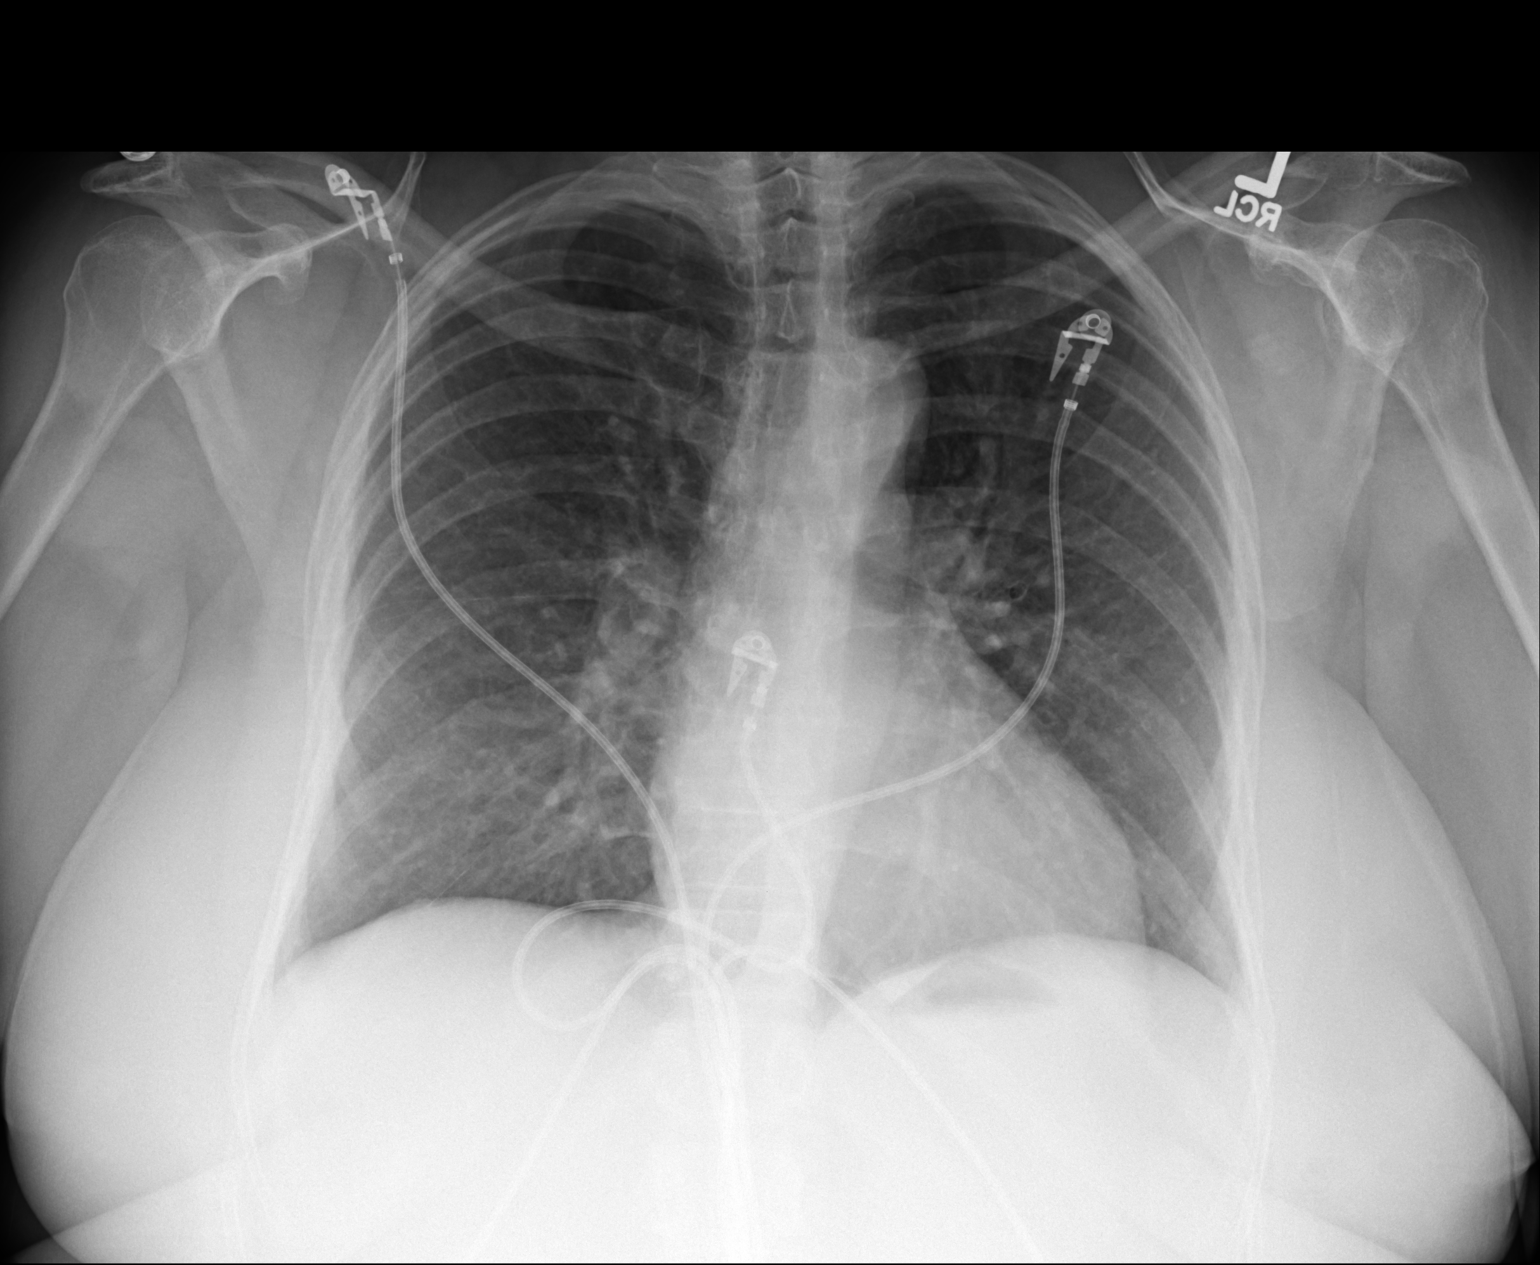
[im 2/2]
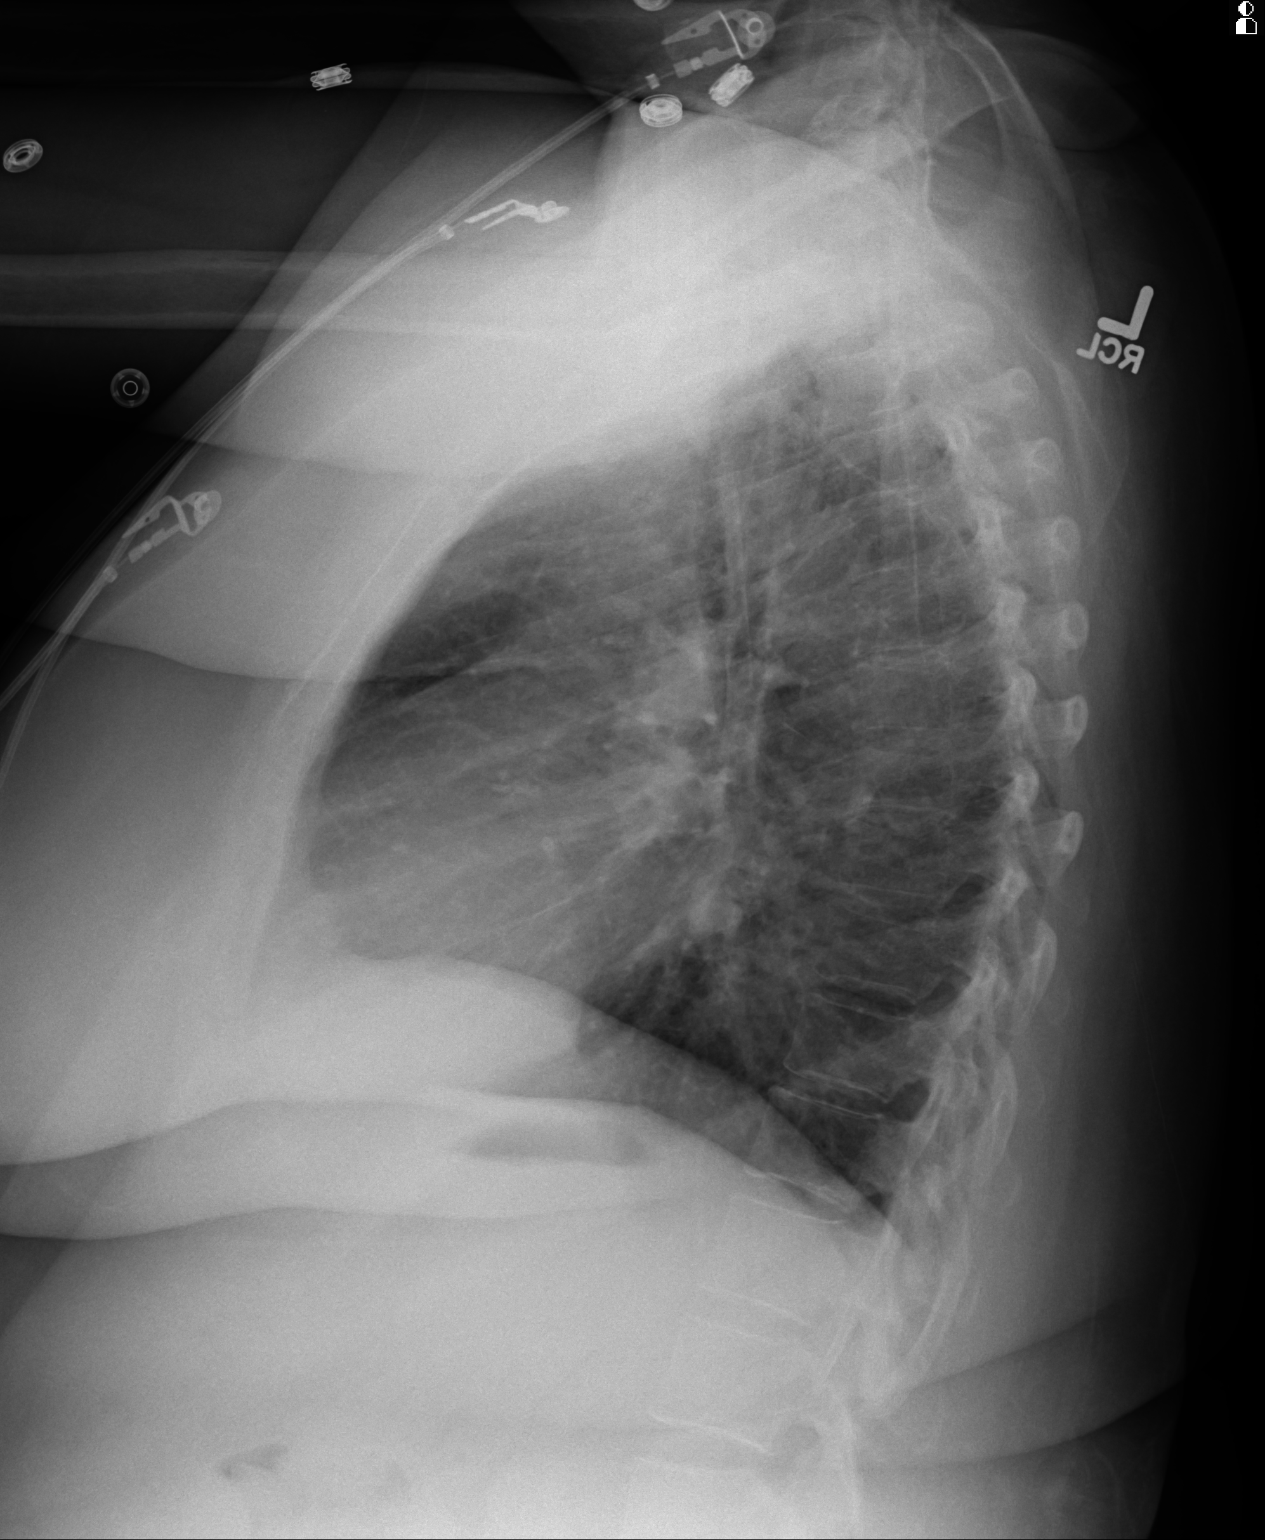

[2 of 2 positions shown; findings below may reference images not displayed]

FINDINGS: The lungs are adequately inflated. There is no focal infiltrate. The
interstitial markings are mildly increased but stable. The cardiac
silhouette is normal in size. The pulmonary vascularity is not
engorged. There is mild tortuosity of the descending thoracic aorta.
There is gentle right lower dextroscoliosis of the thoracic spine.
Otherwise the bony structures exhibit no acute abnormalities.
IMPRESSION: [:
IMPRESSION: [
1. There is no evidence of pneumonia nor CHF. Minimal prominence of
the interstitial markings is stable. I cannot exclude acute
bronchitis in the appropriate clinical setting.
2. There is no evidence of a pleural effusion or pneumothorax.

## 2014-11-22 IMAGING — US US RENAL KIDNEY
1 series · 14 of 25 positions shown · non-contrast
Comparison: none

[Series 1: us renal kidney · 0.28mm/px · 14 of 47 slices shown]
[im 1/47]
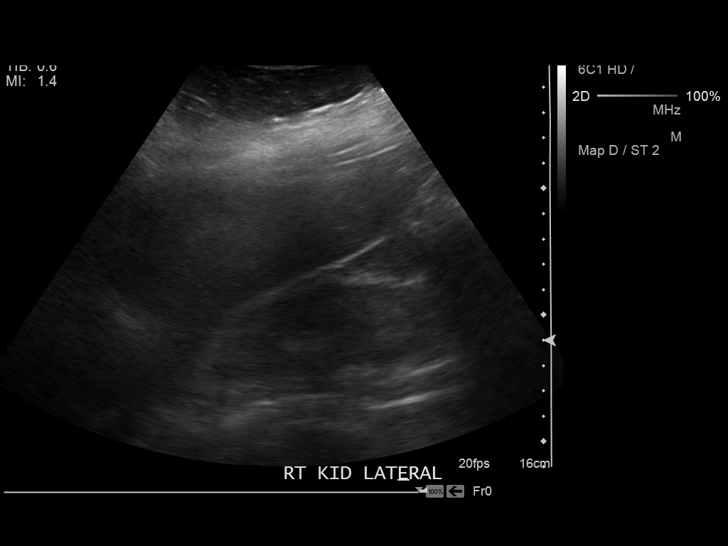
[im 4/47]
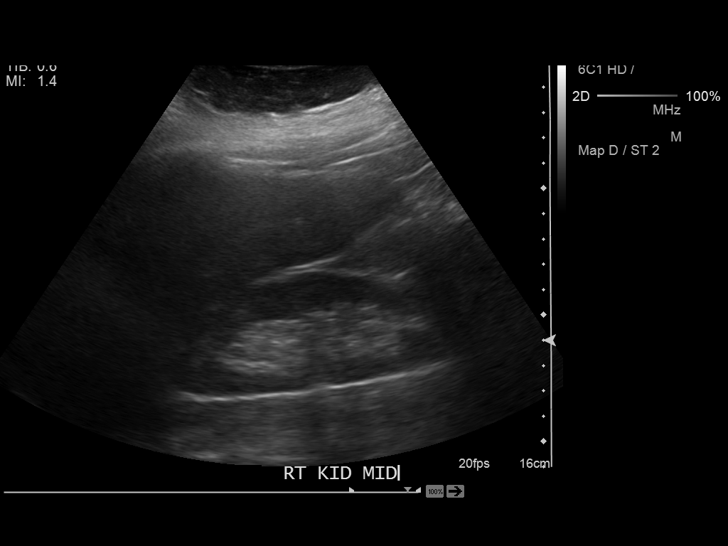
[im 8/47]
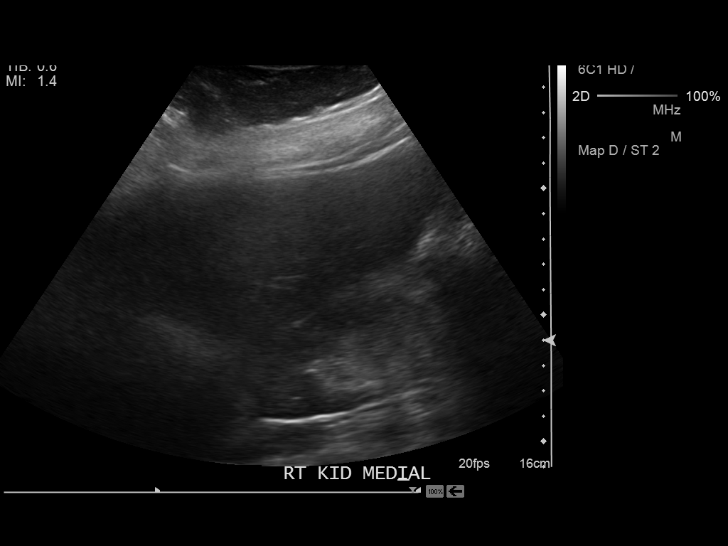
[im 12/47]
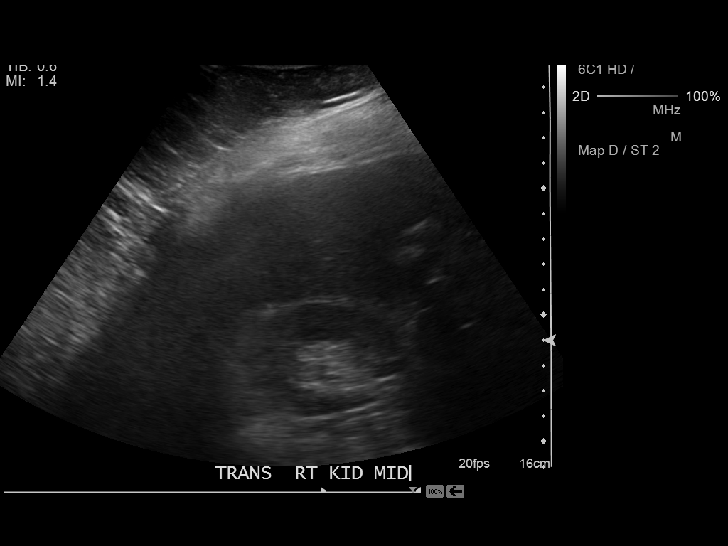
[im 16/47]
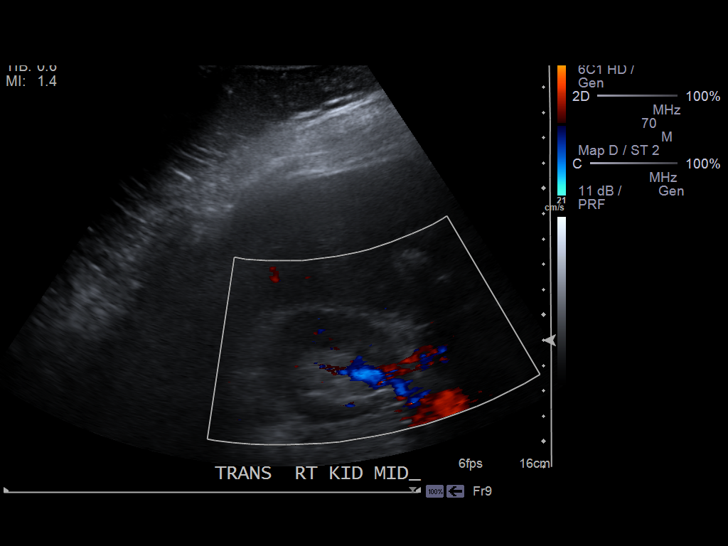
[im 18/47]
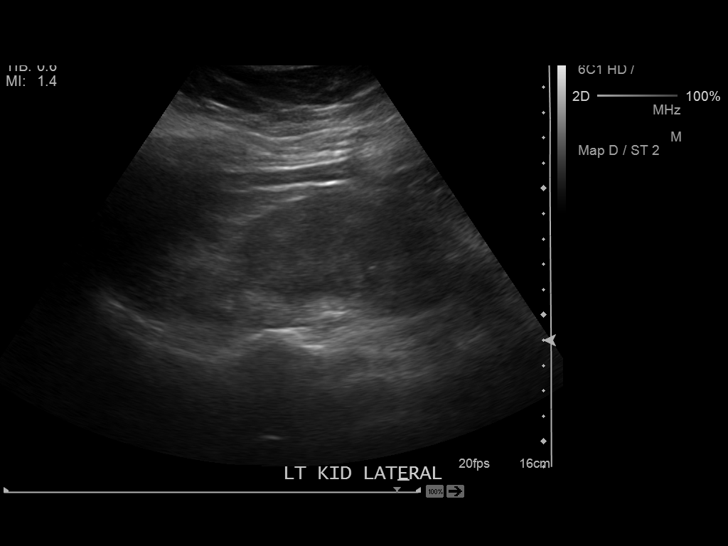
[im 22/47]
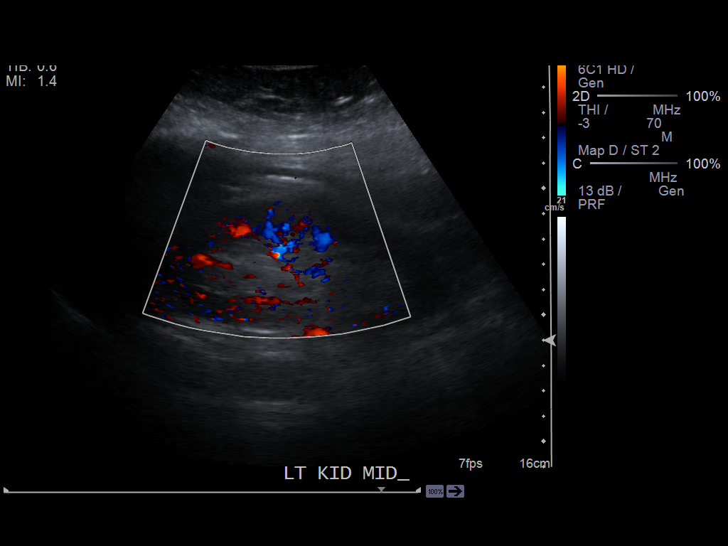
[im 25/47]
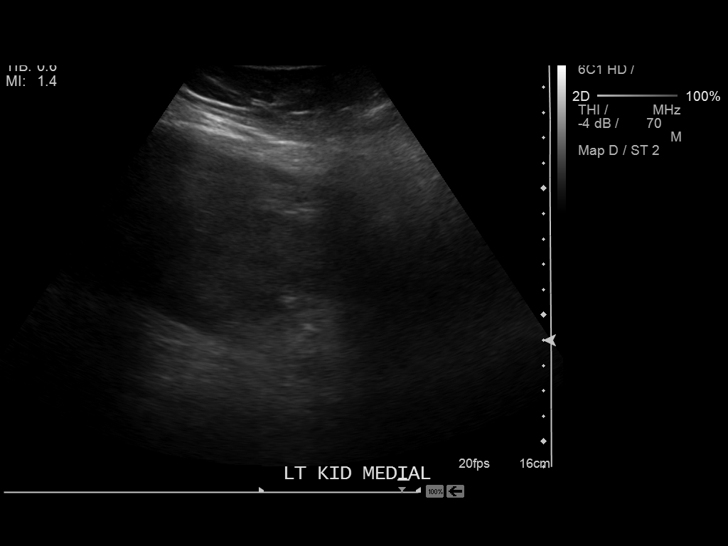
[im 29/47]
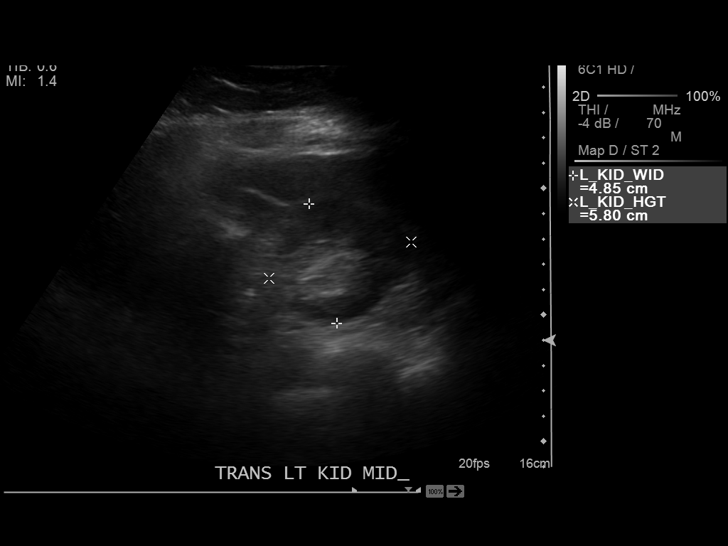
[im 31/47]
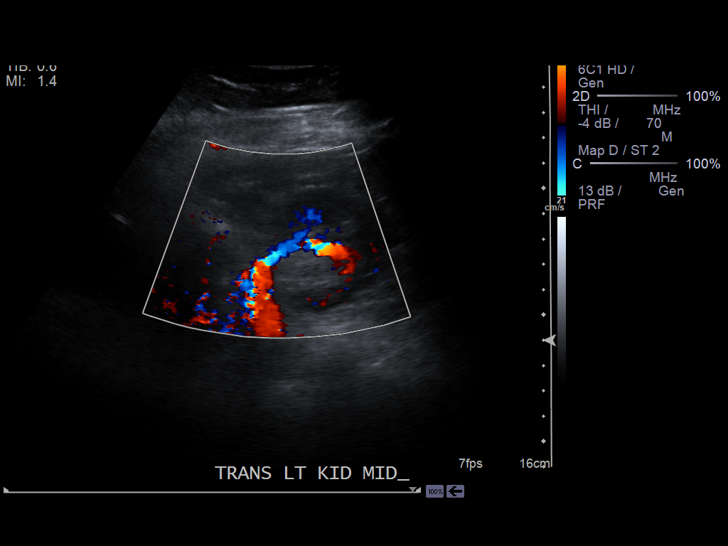
[im 35/47]
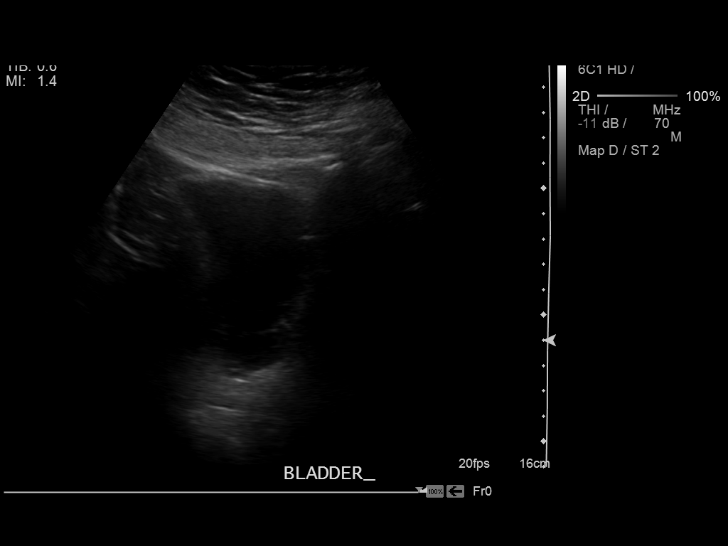
[im 39/47]
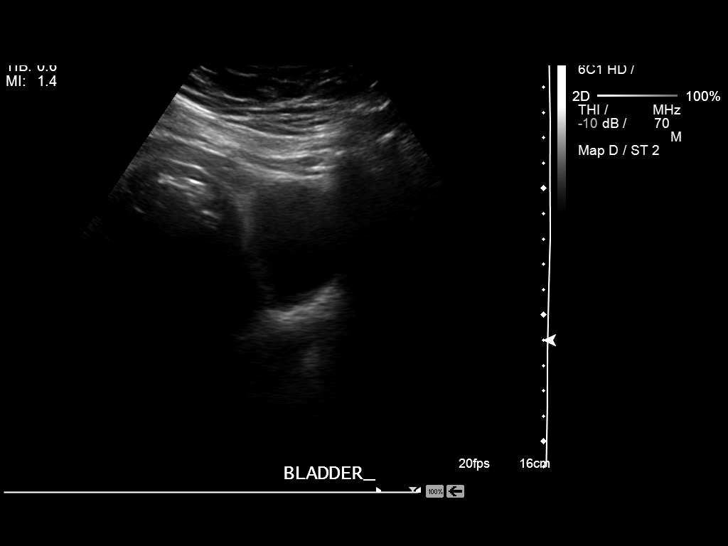
[im 43/47]
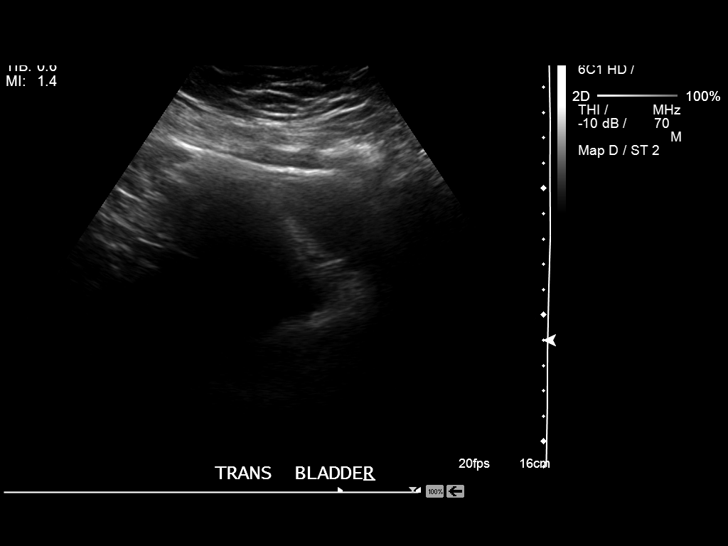
[im 47/47]
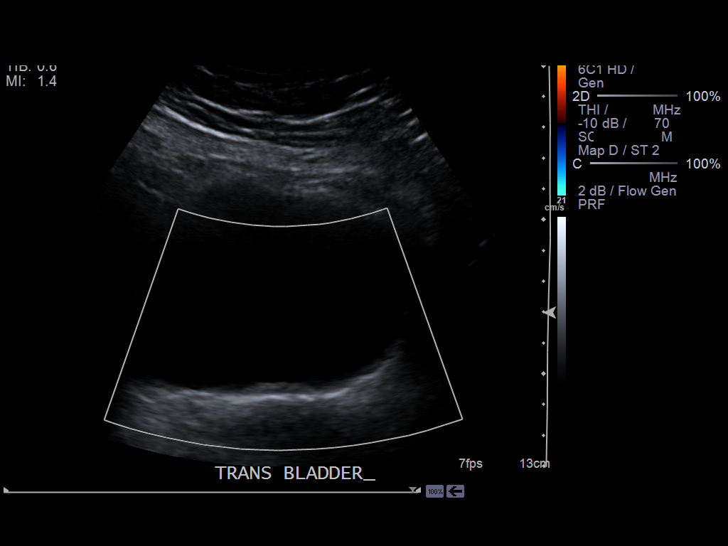

[14 of 25 positions shown; findings below may reference images not displayed]

CLINICAL DATA
Chronic kidney disease, stage III.

EXAM
RENAL/URINARY TRACT ULTRASOUND COMPLETE

COMPARISON
None.

FINDINGS
Right Kidney:

Length: 11.4 cm. Echogenicity within normal limits. No mass or
hydronephrosis visualized.

Left Kidney:

Length: 12.7 cm. Echogenicity within normal limits. No mass or
hydronephrosis visualized.

Bladder:

Appears normal for degree of bladder distention.

IMPRESSION
Normal renal ultrasound.

SIGNATURE

## 2016-01-24 DIAGNOSIS — G4730 Sleep apnea, unspecified: Secondary | ICD-10-CM

## 2016-01-24 HISTORY — DX: Sleep apnea, unspecified: G47.30

## 2018-07-04 ENCOUNTER — Telehealth: Payer: Self-pay

## 2018-07-04 NOTE — Telephone Encounter (Signed)
Pt called and stated that she's interested on getting listed for kidney transplant. Writer asked for insurance information, pt does not have access to computer or fax.  Writer called pt's dialysis clinic for insurance information.  Opal Sidles, the PD nurse, stated that she will fax over pt's insurance and demographic for verification.  Awaiting for pt's insurance information. -MAC

## 2018-07-05 ENCOUNTER — Encounter: Payer: Self-pay | Admitting: Surgery

## 2019-05-12 ENCOUNTER — Inpatient Hospital Stay: Payer: Medicaid Other

## 2019-05-12 ENCOUNTER — Inpatient Hospital Stay
Admission: EM | Admit: 2019-05-12 | Discharge: 2019-05-26 | DRG: 243 | Disposition: A | Discharge status: Home / Self Care | Payer: Medicaid Other | Attending: Student in an Organized Health Care Education/Training Program | Admitting: Student in an Organized Health Care Education/Training Program

## 2019-05-12 DIAGNOSIS — Z79899 Other long term (current) drug therapy: Secondary | ICD-10-CM

## 2019-05-12 DIAGNOSIS — Z6841 Body Mass Index (BMI) 40.0 and over, adult: Secondary | ICD-10-CM

## 2019-05-12 DIAGNOSIS — N390 Urinary tract infection, site not specified: Secondary | ICD-10-CM | POA: Diagnosis present

## 2019-05-12 DIAGNOSIS — E8770 Fluid overload, unspecified: Secondary | ICD-10-CM | POA: Diagnosis not present

## 2019-05-12 DIAGNOSIS — Z833 Family history of diabetes mellitus: Secondary | ICD-10-CM

## 2019-05-12 DIAGNOSIS — J449 Chronic obstructive pulmonary disease, unspecified: Secondary | ICD-10-CM | POA: Diagnosis present

## 2019-05-12 DIAGNOSIS — E1122 Type 2 diabetes mellitus with diabetic chronic kidney disease: Secondary | ICD-10-CM | POA: Diagnosis present

## 2019-05-12 DIAGNOSIS — N186 End stage renal disease: Secondary | ICD-10-CM | POA: Diagnosis present

## 2019-05-12 DIAGNOSIS — Z66 Do not resuscitate: Secondary | ICD-10-CM | POA: Diagnosis present

## 2019-05-12 DIAGNOSIS — B9620 Unspecified Escherichia coli [E. coli] as the cause of diseases classified elsewhere: Secondary | ICD-10-CM | POA: Diagnosis present

## 2019-05-12 DIAGNOSIS — N179 Acute kidney failure, unspecified: Secondary | ICD-10-CM | POA: Diagnosis present

## 2019-05-12 DIAGNOSIS — K2970 Gastritis, unspecified, without bleeding: Secondary | ICD-10-CM | POA: Diagnosis present

## 2019-05-12 DIAGNOSIS — Z96659 Presence of unspecified artificial knee joint: Secondary | ICD-10-CM | POA: Diagnosis present

## 2019-05-12 DIAGNOSIS — E1165 Type 2 diabetes mellitus with hyperglycemia: Secondary | ICD-10-CM | POA: Diagnosis present

## 2019-05-12 DIAGNOSIS — D508 Other iron deficiency anemias: Secondary | ICD-10-CM | POA: Insufficient documentation

## 2019-05-12 DIAGNOSIS — I2510 Atherosclerotic heart disease of native coronary artery without angina pectoris: Secondary | ICD-10-CM | POA: Diagnosis present

## 2019-05-12 DIAGNOSIS — K659 Peritonitis, unspecified: Secondary | ICD-10-CM

## 2019-05-12 DIAGNOSIS — E876 Hypokalemia: Secondary | ICD-10-CM | POA: Diagnosis present

## 2019-05-12 DIAGNOSIS — A084 Viral intestinal infection, unspecified: Secondary | ICD-10-CM | POA: Diagnosis present

## 2019-05-12 DIAGNOSIS — D501 Sideropenic dysphagia: Secondary | ICD-10-CM | POA: Diagnosis present

## 2019-05-12 DIAGNOSIS — K922 Gastrointestinal hemorrhage, unspecified: Secondary | ICD-10-CM

## 2019-05-12 DIAGNOSIS — Z794 Long term (current) use of insulin: Secondary | ICD-10-CM

## 2019-05-12 DIAGNOSIS — T85611A Breakdown (mechanical) of intraperitoneal dialysis catheter, initial encounter: Secondary | ICD-10-CM | POA: Insufficient documentation

## 2019-05-12 DIAGNOSIS — K920 Hematemesis: Secondary | ICD-10-CM

## 2019-05-12 DIAGNOSIS — Z7902 Long term (current) use of antithrombotics/antiplatelets: Secondary | ICD-10-CM

## 2019-05-12 DIAGNOSIS — Z20822 Contact with and (suspected) exposure to covid-19: Secondary | ICD-10-CM | POA: Diagnosis present

## 2019-05-12 DIAGNOSIS — Z7982 Long term (current) use of aspirin: Secondary | ICD-10-CM

## 2019-05-12 DIAGNOSIS — Z87440 Personal history of urinary (tract) infections: Secondary | ICD-10-CM

## 2019-05-12 DIAGNOSIS — Z751 Person awaiting admission to adequate facility elsewhere: Secondary | ICD-10-CM

## 2019-05-12 DIAGNOSIS — D631 Anemia in chronic kidney disease: Secondary | ICD-10-CM | POA: Diagnosis present

## 2019-05-12 DIAGNOSIS — K2091 Esophagitis, unspecified with bleeding: Principal | ICD-10-CM | POA: Diagnosis present

## 2019-05-12 DIAGNOSIS — Z8249 Family history of ischemic heart disease and other diseases of the circulatory system: Secondary | ICD-10-CM

## 2019-05-12 DIAGNOSIS — K5900 Constipation, unspecified: Secondary | ICD-10-CM | POA: Diagnosis not present

## 2019-05-12 DIAGNOSIS — E785 Hyperlipidemia, unspecified: Secondary | ICD-10-CM | POA: Diagnosis present

## 2019-05-12 DIAGNOSIS — K449 Diaphragmatic hernia without obstruction or gangrene: Secondary | ICD-10-CM | POA: Diagnosis present

## 2019-05-12 DIAGNOSIS — Z992 Dependence on renal dialysis: Secondary | ICD-10-CM

## 2019-05-12 HISTORY — DX: Hyperlipidemia, unspecified: E78.5

## 2019-05-12 HISTORY — DX: Chronic obstructive pulmonary disease, unspecified: J44.9

## 2019-05-12 HISTORY — DX: Type 2 diabetes mellitus without complications: E11.9

## 2019-05-12 HISTORY — DX: Essential (primary) hypertension: I10

## 2019-05-12 LAB — CBC AND DIFFERENTIAL
Absolute NRBC: 0.02 10*3/uL — ABNORMAL HIGH (ref 0.00–0.00)
Basophils Absolute Automated: 0.01 10*3/uL (ref 0.00–0.08)
Basophils Automated: 0.2 %
Eosinophils Absolute Automated: 0.02 10*3/uL (ref 0.00–0.44)
Eosinophils Automated: 0.4 %
Hematocrit: 29.8 % — ABNORMAL LOW (ref 34.7–43.7)
Hgb: 9.3 g/dL — ABNORMAL LOW (ref 11.4–14.8)
Immature Granulocytes Absolute: 0.02 10*3/uL (ref 0.00–0.07)
Immature Granulocytes: 0.4 %
Lymphocytes Absolute Automated: 0.75 10*3/uL (ref 0.42–3.22)
Lymphocytes Automated: 15.4 %
MCH: 29.5 pg (ref 25.1–33.5)
MCHC: 31.2 g/dL — ABNORMAL LOW (ref 31.5–35.8)
MCV: 94.6 fL (ref 78.0–96.0)
MPV: 12.9 fL — ABNORMAL HIGH (ref 8.9–12.5)
Monocytes Absolute Automated: 0.35 10*3/uL (ref 0.21–0.85)
Monocytes: 7.2 %
Neutrophils Absolute: 3.72 10*3/uL (ref 1.10–6.33)
Neutrophils: 76.4 %
Nucleated RBC: 0.4 /100 WBC — ABNORMAL HIGH (ref 0.0–0.0)
Platelets: 133 10*3/uL — ABNORMAL LOW (ref 142–346)
RBC: 3.15 10*6/uL — ABNORMAL LOW (ref 3.90–5.10)
RDW: 13 % (ref 11–15)
WBC: 4.87 10*3/uL (ref 3.10–9.50)

## 2019-05-12 LAB — COMPREHENSIVE METABOLIC PANEL
ALT: 12 U/L (ref 0–55)
AST (SGOT): 7 U/L (ref 5–34)
Albumin/Globulin Ratio: 0.7 — ABNORMAL LOW (ref 0.9–2.2)
Albumin: 2.4 g/dL — ABNORMAL LOW (ref 3.5–5.0)
Alkaline Phosphatase: 114 U/L — ABNORMAL HIGH (ref 37–106)
Anion Gap: 13 (ref 5.0–15.0)
BUN: 38 mg/dL — ABNORMAL HIGH (ref 7.0–19.0)
Bilirubin, Total: 0.4 mg/dL (ref 0.2–1.2)
CO2: 24 mEq/L (ref 22–29)
Calcium: 8.3 mg/dL — ABNORMAL LOW (ref 8.5–10.5)
Chloride: 100 mEq/L (ref 100–111)
Creatinine: 4.7 mg/dL — ABNORMAL HIGH (ref 0.6–1.0)
Globulin: 3.6 g/dL (ref 2.0–3.6)
Glucose: 551 mg/dL (ref 70–100)
Potassium: 3.5 mEq/L (ref 3.5–5.1)
Protein, Total: 6 g/dL (ref 6.0–8.3)
Sodium: 137 mEq/L (ref 136–145)

## 2019-05-12 LAB — URINALYSIS REFLEX TO MICROSCOPIC EXAM - REFLEX TO CULTURE
Bilirubin, UA: NEGATIVE
Glucose, UA: NEGATIVE
Ketones UA: NEGATIVE
Nitrite, UA: NEGATIVE
Protein, UR: 30 — AB
Specific Gravity UA: 1.008 (ref 1.001–1.035)
Urine pH: 9 — AB (ref 5.0–8.0)
Urobilinogen, UA: 4 mg/dL — AB (ref 0.2–2.0)

## 2019-05-12 LAB — GLUCOSE WHOLE BLOOD - POCT
Whole Blood Glucose POCT: 304 mg/dL — ABNORMAL HIGH (ref 70–100)
Whole Blood Glucose POCT: 221 mg/dL — ABNORMAL HIGH (ref 70–100)

## 2019-05-12 LAB — COVID-19 (SARS-COV-2): SARS CoV 2 Overall Result: NEGATIVE

## 2019-05-12 LAB — GFR: EGFR: 11.3

## 2019-05-12 LAB — LACTIC ACID, PLASMA: Lactic Acid: 1.8 mmol/L (ref 0.2–2.0)

## 2019-05-12 LAB — HEMOLYSIS INDEX: Hemolysis Index: 3 (ref 0–18)

## 2019-05-12 LAB — LIPASE: Lipase: 22 U/L (ref 8–78)

## 2019-05-12 MED ORDER — NALOXONE HCL 0.4 MG/ML IJ SOLN (WRAP)
0.20 mg | INTRAMUSCULAR | Status: DC | PRN
Start: 2019-05-12 — End: 2019-05-26

## 2019-05-12 MED ORDER — INSULIN LISPRO 100 UNIT/ML SC SOLN
3.00 [IU] | Freq: Three times a day (TID) | SUBCUTANEOUS | Status: DC
Start: 2019-05-13 — End: 2019-05-26
  Administered 2019-05-13 – 2019-05-25 (×32): 3 [IU] via SUBCUTANEOUS
  Filled 2019-05-12 (×27): qty 9

## 2019-05-12 MED ORDER — SODIUM CHLORIDE 0.9 % IV SOLN
1500.00 mg | Freq: Once | INTRAVENOUS | Status: AC
Start: 2019-05-12 — End: 2019-05-12
  Administered 2019-05-12: 20:00:00 1500 mg via INTRAVENOUS
  Filled 2019-05-12: qty 1500

## 2019-05-12 MED ORDER — ONDANSETRON HCL 4 MG/2ML IJ SOLN
4.00 mg | Freq: Four times a day (QID) | INTRAMUSCULAR | Status: DC | PRN
Start: 2019-05-12 — End: 2019-05-26
  Administered 2019-05-26: 09:00:00 4 mg via INTRAVENOUS
  Filled 2019-05-12: qty 2

## 2019-05-12 MED ORDER — DEXTROSE 50 % IV SOLN
12.50 g | INTRAVENOUS | Status: DC | PRN
Start: 2019-05-12 — End: 2019-05-26

## 2019-05-12 MED ORDER — INSULIN GLARGINE 100 UNIT/ML SC SOLN
15.00 [IU] | Freq: Every evening | SUBCUTANEOUS | Status: DC
Start: 2019-05-12 — End: 2019-05-24
  Administered 2019-05-13 – 2019-05-23 (×12): 15 [IU] via SUBCUTANEOUS
  Filled 2019-05-12 (×12): qty 15

## 2019-05-12 MED ORDER — LABETALOL HCL 5 MG/ML IV SOLN (WRAP)
10.00 mg | Freq: Once | INTRAVENOUS | Status: AC
Start: 2019-05-12 — End: 2019-05-12
  Administered 2019-05-12: 16:00:00 10 mg via INTRAVENOUS
  Filled 2019-05-12: qty 4

## 2019-05-12 MED ORDER — ONDANSETRON HCL 4 MG/2ML IJ SOLN
4.00 mg | Freq: Once | INTRAMUSCULAR | Status: AC
Start: 2019-05-12 — End: 2019-05-12
  Administered 2019-05-12: 16:00:00 4 mg via INTRAVENOUS
  Filled 2019-05-12: qty 2

## 2019-05-12 MED ORDER — MORPHINE SULFATE 2 MG/ML IJ/IV SOLN (WRAP)
2.0000 mg | Status: DC | PRN
Start: 2019-05-12 — End: 2019-05-20
  Administered 2019-05-13 – 2019-05-19 (×12): 2 mg via INTRAVENOUS
  Filled 2019-05-12 (×12): qty 1

## 2019-05-12 MED ORDER — ATORVASTATIN CALCIUM 40 MG PO TABS
40.0000 mg | ORAL_TABLET | Freq: Every day | ORAL | Status: DC
Start: 2019-05-13 — End: 2019-05-26
  Administered 2019-05-13 – 2019-05-26 (×14): 40 mg via ORAL
  Filled 2019-05-12 (×14): qty 1

## 2019-05-12 MED ORDER — ASPIRIN 81 MG PO TBEC
81.00 mg | DELAYED_RELEASE_TABLET | Freq: Every day | ORAL | Status: DC
Start: 2019-05-13 — End: 2019-05-13
  Administered 2019-05-13: 09:00:00 81 mg via ORAL
  Filled 2019-05-12: qty 1

## 2019-05-12 MED ORDER — GLUCAGON 1 MG IJ SOLR (WRAP)
1.00 mg | INTRAMUSCULAR | Status: DC | PRN
Start: 2019-05-12 — End: 2019-05-26

## 2019-05-12 MED ORDER — HEPARIN SODIUM (PORCINE) 5000 UNIT/ML IJ SOLN
5000.00 [IU] | Freq: Three times a day (TID) | INTRAMUSCULAR | Status: DC
Start: 2019-05-12 — End: 2019-05-13
  Administered 2019-05-13 (×2): 5000 [IU] via SUBCUTANEOUS
  Filled 2019-05-12 (×3): qty 1

## 2019-05-12 MED ORDER — ONDANSETRON 4 MG PO TBDP
4.00 mg | ORAL_TABLET | Freq: Four times a day (QID) | ORAL | Status: DC | PRN
Start: 2019-05-12 — End: 2019-05-26

## 2019-05-12 MED ORDER — INSULIN LISPRO 100 UNIT/ML SC SOLN
1.00 [IU] | Freq: Every evening | SUBCUTANEOUS | Status: DC
Start: 2019-05-12 — End: 2019-05-26
  Administered 2019-05-13: 01:00:00 1 [IU] via SUBCUTANEOUS
  Administered 2019-05-14: 23:00:00 2 [IU] via SUBCUTANEOUS
  Administered 2019-05-15: 23:00:00 3 [IU] via SUBCUTANEOUS
  Administered 2019-05-16: 22:00:00 2 [IU] via SUBCUTANEOUS
  Administered 2019-05-17: 22:00:00 3 [IU] via SUBCUTANEOUS
  Administered 2019-05-18 – 2019-05-19 (×2): 4 [IU] via SUBCUTANEOUS
  Administered 2019-05-20: 22:00:00 2 [IU] via SUBCUTANEOUS
  Administered 2019-05-22: 23:00:00 4 [IU] via SUBCUTANEOUS
  Administered 2019-05-23: 22:00:00 3 [IU] via SUBCUTANEOUS
  Filled 2019-05-12: qty 6
  Filled 2019-05-12: qty 12
  Filled 2019-05-12: qty 3
  Filled 2019-05-12: qty 6
  Filled 2019-05-12: qty 3
  Filled 2019-05-12: qty 9
  Filled 2019-05-12 (×2): qty 12
  Filled 2019-05-12: qty 6
  Filled 2019-05-12 (×2): qty 9

## 2019-05-12 MED ORDER — MORPHINE SULFATE 4 MG/ML IJ/IV SOLN (WRAP)
4.0000 mg | Freq: Once | Status: AC
Start: 2019-05-12 — End: 2019-05-12
  Administered 2019-05-12: 19:00:00 4 mg via INTRAVENOUS
  Filled 2019-05-12: qty 1

## 2019-05-12 MED ORDER — SODIUM CHLORIDE 0.9 % IV BOLUS
1000.00 mL | Freq: Once | INTRAVENOUS | Status: AC
Start: 2019-05-12 — End: 2019-05-12
  Administered 2019-05-12: 15:00:00 1000 mL via INTRAVENOUS

## 2019-05-12 MED ORDER — CARVEDILOL 25 MG PO TABS
25.0000 mg | ORAL_TABLET | Freq: Two times a day (BID) | ORAL | Status: DC
Start: 2019-05-12 — End: 2019-05-26
  Administered 2019-05-13 – 2019-05-26 (×28): 25 mg via ORAL
  Filled 2019-05-12 (×15): qty 1
  Filled 2019-05-12: qty 2
  Filled 2019-05-12 (×12): qty 1

## 2019-05-12 MED ORDER — OXYCODONE-ACETAMINOPHEN 5-325 MG PO TABS
1.0000 | ORAL_TABLET | ORAL | Status: DC | PRN
Start: 2019-05-12 — End: 2019-05-20
  Administered 2019-05-13 – 2019-05-18 (×7): 1 via ORAL
  Filled 2019-05-12 (×7): qty 1

## 2019-05-12 MED ORDER — MORPHINE SULFATE 4 MG/ML IJ/IV SOLN (WRAP)
4.0000 mg | Freq: Once | Status: AC
Start: 2019-05-12 — End: 2019-05-12
  Administered 2019-05-12: 16:00:00 4 mg via INTRAVENOUS
  Filled 2019-05-12: qty 1

## 2019-05-12 MED ORDER — SEVELAMER CARBONATE 800 MG PO TABS
800.0000 mg | ORAL_TABLET | Freq: Three times a day (TID) | ORAL | Status: DC
Start: 2019-05-13 — End: 2019-05-26
  Administered 2019-05-13 – 2019-05-26 (×37): 800 mg via ORAL
  Filled 2019-05-12 (×38): qty 1

## 2019-05-12 MED ORDER — AMLODIPINE BESYLATE 5 MG PO TABS
5.0000 mg | ORAL_TABLET | Freq: Every day | ORAL | Status: DC
Start: 2019-05-13 — End: 2019-05-18
  Administered 2019-05-13 – 2019-05-17 (×4): 5 mg via ORAL
  Filled 2019-05-12 (×5): qty 1

## 2019-05-12 MED ORDER — SODIUM CHLORIDE 0.9 % IV MBP
1.00 g | INTRAVENOUS | Status: DC
Start: 2019-05-13 — End: 2019-05-14
  Administered 2019-05-13 – 2019-05-14 (×2): 1 g via INTRAVENOUS
  Filled 2019-05-12 (×2): qty 1000

## 2019-05-12 MED ORDER — SODIUM CHLORIDE 0.9 % IV BOLUS
2000.00 mL | Freq: Once | INTRAVENOUS | Status: AC
Start: 2019-05-12 — End: 2019-05-12
  Administered 2019-05-12: 20:00:00 2000 mL via INTRAVENOUS

## 2019-05-12 MED ORDER — PIPERACILLIN-TAZOBACTAM 2.25 GM MBP (CNR)
2.25 g | Freq: Once | INTRAVENOUS | Status: AC
Start: 2019-05-12 — End: 2019-05-12
  Administered 2019-05-12: 19:00:00 2250 mg via INTRAVENOUS
  Filled 2019-05-12: qty 2250

## 2019-05-12 MED ORDER — GLUCOSE 40 % PO GEL
15.00 g | ORAL | Status: DC | PRN
Start: 2019-05-12 — End: 2019-05-26

## 2019-05-12 MED ORDER — INSULIN LISPRO 100 UNIT/ML SC SOLN
1.00 [IU] | Freq: Three times a day (TID) | SUBCUTANEOUS | Status: DC
Start: 2019-05-13 — End: 2019-05-26
  Administered 2019-05-15: 09:00:00 7 [IU] via SUBCUTANEOUS
  Administered 2019-05-16: 10:00:00 5 [IU] via SUBCUTANEOUS
  Administered 2019-05-17: 09:00:00 1 [IU] via SUBCUTANEOUS
  Administered 2019-05-18: 08:00:00 3 [IU] via SUBCUTANEOUS
  Administered 2019-05-20 – 2019-05-22 (×4): 5 [IU] via SUBCUTANEOUS
  Administered 2019-05-23: 13:00:00 1 [IU] via SUBCUTANEOUS
  Administered 2019-05-23 – 2019-05-24 (×2): 3 [IU] via SUBCUTANEOUS
  Filled 2019-05-12: qty 3
  Filled 2019-05-12 (×2): qty 9
  Filled 2019-05-12: qty 21
  Filled 2019-05-12 (×2): qty 3
  Filled 2019-05-12: qty 9
  Filled 2019-05-12 (×3): qty 15

## 2019-05-12 NOTE — ED Notes (Signed)
[facility]  ED NURSING NOTE FOR THE RECEIVING INPATIENT NURSE   ED [place]   [place] [phone]   ED CHARGE RN [phone]   ADMISSION INFORMATION   [name] is a 64 y.o. female admitted with a diagnosis of:    1. AKI (acute kidney injury)         Isolation: None   Allergies: Lisinopril and Metformin   Holding Orders confirmed? Yes   Belongings Documented? Yes   Home medications sent to pharmacy confirmed? na   NURSING CARE   Patient Comes From:   Mental Status: Home Independent  lethargic   ADL: Needs assistance with ADLs   Ambulation: Unable to assess   Pertinent Information  and Safety Concerns: 2075ml ns bolus/ vanco infusing     VITAL SIGNS   Time BP Temp Pulse Resp SpO2   1930 154/73 98.1 94 16 96   CT / NIH   CT Head ordered on this patient?  No   NIH/Dysphagia assessment done prior to admission? No   PERSONAL PROTECTIVE EQUIPMENT   None   LAB RESULTS   Labs Reviewed   CBC AND DIFFERENTIAL - Abnormal; Notable for the following components:       Result Value    Hgb 9.3 (*)     Hematocrit 29.8 (*)     Platelets 133 (*)     RBC 3.15 (*)     MCHC 31.2 (*)     MPV 12.9 (*)     Nucleated RBC 0.4 (*)     Absolute NRBC 0.02 (*)     All other components within normal limits    Narrative:     Replace urinary catheter prior to obtaining the urine culture  if it has been in place for greater than or equal to 14  days:->N/A No Foley  Indications for U/A Reflex to Micro - Reflex to  Culture:->Suprapubic Pain/Tenderness or Dysuria   COMPREHENSIVE METABOLIC PANEL - Abnormal; Notable for the following components:    Glucose 551 (*)     BUN 38.0 (*)     Creatinine 4.7 (*)     Calcium 8.3 (*)     Albumin 2.4 (*)     Alkaline Phosphatase 114 (*)     Albumin/Globulin Ratio 0.7 (*)     All other components within normal limits    Narrative:     Replace urinary catheter prior to obtaining the urine culture  if it has been in place for greater than or equal to 14  days:->N/A No Foley  Indications for U/A  Reflex to Micro - Reflex to  Culture:->Suprapubic Pain/Tenderness or Dysuria   URINALYSIS REFLEX TO MICROSCOPIC EXAM - REFLEX TO CULTURE - Abnormal; Notable for the following components:    Clarity, UA Sl Cloudy (*)     Urine pH 9.0 (*)     Leukocyte Esterase, UA Moderate (*)     Protein, UR 30 (*)     Urobilinogen, UA 4.0 (*)     Blood, UA Large (*)     RBC, UA 26 - 50 (*)     WBC, UA 26 - 50 (*)     All other components within normal limits    Narrative:     Replace urinary catheter prior to obtaining the urine culture  if it has been in place for greater than or equal to 14  days:->N/A No Foley  Indications for U/A Reflex to Micro - Reflex to  Culture:->Suprapubic Pain/Tenderness  or Dysuria   GLUCOSE WHOLE BLOOD - POCT - Abnormal; Notable for the following components:    Whole Blood Glucose POCT 304 (*)     All other components within normal limits   COVID-19 (SARS-COV-2)    Narrative:     o Collect and clearly label specimen type:  o Upper respiratory specimen: One Nasopharyngeal Dry Swab NO  Transport Media.  o Hand deliver to laboratory ASAP  Indication for testing->Extended care facility admission to  semi private room   URINE CULTURE    Narrative:     Replace urinary catheter prior to obtaining the urine culture  if it has been in place for greater than or equal to 14  days:->N/A No Foley  Indications for U/A Reflex to Micro - Reflex to  Culture:->Suprapubic Pain/Tenderness or Dysuria   CULTURE BLOOD AEROBIC AND ANAEROBIC    Narrative:     1 BLUE+1 PURPLE   CULTURE BLOOD AEROBIC AND ANAEROBIC    Narrative:     1 BLUE+1 PURPLE   LIPASE    Narrative:     Replace urinary catheter prior to obtaining the urine culture  if it has been in place for greater than or equal to 14  days:->N/A No Foley  Indications for U/A Reflex to Micro - Reflex to  Culture:->Suprapubic Pain/Tenderness or Dysuria   HEMOLYSIS INDEX    Narrative:     Replace urinary catheter prior to obtaining the urine culture  if it has been in place  for greater than or equal to 14  days:->N/A No Foley  Indications for U/A Reflex to Micro - Reflex to  Culture:->Suprapubic Pain/Tenderness or Dysuria   GFR    Narrative:     Replace urinary catheter prior to obtaining the urine culture  if it has been in place for greater than or equal to 14  days:->N/A No Foley  Indications for U/A Reflex to Micro - Reflex to  Culture:->Suprapubic Pain/Tenderness or Dysuria   LACTIC ACID, PLASMA          Ticket to Ride Printed: Yes

## 2019-05-12 NOTE — ED Triage Notes (Signed)
Pt BIBA c/o nausea vomiting diarrhea abd pain for 3 days. She reports a temp of 102 F at home. Took tylenol today and vomited this morning. Recently had a Hep B vaccination on Friday.

## 2019-05-12 NOTE — ED Notes (Signed)
Bed: BL21  Expected date:   Expected time:   Means of arrival:   Comments:  JOINO 676

## 2019-05-12 NOTE — Progress Notes (Signed)
Consult received  Data reviewed  Mgmt d/w ED Physician  (Dr. Dwyane Dee)  Full consult to follow in am    Thanks

## 2019-05-12 NOTE — ED Provider Notes (Signed)
EMERGENCY DEPARTMENT HISTORY AND PHYSICAL EXAM     None        Date: 05/12/2019  Patient Name: [name]    History of Presenting Illness     Chief Complaint   Patient presents with    Abdominal Pain    Nausea    Emesis    Diarrhea    Fever       History Provided By: patient    Chief Complaint: vomiting       Additional History: ASANTE RITACCO is a 64 y.o. female presenting to the ED with severe, intermittent, nonbloody vomiting for the past 3 days.  Patient has associated nonbloody diarrhea and diffuse cramping abdominal pain.  No sick contacts coronavirus positive contacts.  No chest pain or shortness of breath.  Patient has associated severe fatigue.  No fever chills.  No sore throat or runny nose.  No cough.      PCP: No primary care provider on file.  SPECIALISTS:    Current Facility-Administered Medications   Medication Dose Route Frequency Provider Last Rate Last Admin    [START ON 05/13/2019] amLODIPine (NORVASC) tablet 5 mg  5 mg Oral Daily Toy Care, MD        [START ON 05/13/2019] aspirin EC tablet 81 mg  81 mg Oral Daily Toy Care, MD        Derrill Memo ON 05/13/2019] atorvastatin (LIPITOR) tablet 40 mg  40 mg Oral Daily Toy Care, MD        carvedilol (COREG) tablet 25 mg  25 mg Oral Q12H [facility] Toy Care, MD        dextrose (GLUCOSE) 40 % oral gel 15 g of glucose  15 g of glucose Oral PRN Toy Care, MD        And    dextrose 50 % bolus 12.5 g  12.5 g Intravenous PRN Toy Care, MD        And    glucagon (rDNA) (GLUCAGEN) injection 1 mg  1 mg Intramuscular PRN Toy Care, MD        heparin (porcine) injection 5,000 Units  5,000 Units Subcutaneous [facility] Toy Care, MD        insulin glargine (LANTUS) injection 15 Units  15 Units Subcutaneous QHS Toy Care, MD        insulin lispro (HumaLOG) injection 1-4 Units  1-4 Units Subcutaneous QHS Toy Care, MD        [START ON 05/13/2019] insulin lispro (HumaLOG) injection 1-8 Units  1-8 Units Subcutaneous TID AC Toy Care, MD        [START ON 05/13/2019] insulin lispro (HumaLOG) injection 3 Units  3 Units Subcutaneous TID MEALS Toy Care, MD        naloxone [facility]) injection 0.2 mg  0.2 mg Intravenous PRN Toy Care, MD        ondansetron (ZOFRAN-ODT) disintegrating tablet 4 mg  4 mg Oral Q6H PRN Toy Care, MD        Or    ondansetron [facility]) injection 4 mg  4 mg Intravenous Q6H PRN Toy Care, MD        [START ON 05/13/2019] sevelamer (RENVELA) tablet 800 mg  800 mg Oral TID MEALS Toy Care, MD           Past History     Past Medical History:  Past Medical History:   Diagnosis Date  Chronic obstructive pulmonary disease     Diabetes mellitus     Hyperlipidemia     Hypertension        Past Surgical History:  Past Surgical History:   Procedure Laterality Date    JOINT REPLACEMENT      shoulder    JOINT REPLACEMENT      knee    OVARY SURGERY Left        Family History:  Family History   Problem Relation Age of Onset    Diabetes Mother     Hypertension Mother     Diabetes Father     Hypertension Father     Diabetes Sister     Hypertension Sister     Diabetes Brother     Hypertension Brother        Social History:  Social History     Tobacco Use    Smoking status: Never Smoker    Smokeless tobacco: Never Used   Substance Use Topics    Alcohol use: Never    Drug use: Never       Allergies:  Allergies   Allergen Reactions    Lisinopril Hives    Metformin Hives       Review of Systems     Review of Systems   Constitutional: Negative for fever    HENT: Negative for rhinorrhea and sore throat.    Eyes: Negative for discharge, redness and visual disturbance.   Respiratory: Negative for cough and shortness of breath.    Cardiovascular: Negative for chest pain and leg swelling.   Gastrointestinal: (+) nausea and abdominal pain.   Endocrine: Negative for polyuria.   Genitourinary: Negative for dysuria, urgency, frequency and flank pain.   Musculoskeletal: Negative for back pain and neck pain.    Skin: Negative for rash.   Allergic/Immunologic: Negative for immunocompromised state.   Neurological: Negative for light-headedness and headaches.   Hematological: Does not bruise/bleed easily.   Psychiatric/Behavioral: Negative for suicidal ideas.         Physical Exam   BP 144/62    Pulse 95    Temp 99.1 F (37.3 C) (Oral)    Resp 17    Ht 5' 2.01" (1.575 m)    Wt 112 kg    LMP  (LMP Unknown) Comment: post menopausal   SpO2 95%    BMI 45.17 kg/m     Constitutional: Vital signs reviewed. Well nourished. Vomiting into bag  Head: Normocephalic, atraumatic  Eyes: Conjunctiva and sclera are normal.  No injection or discharge.  Ears, Nose, Throat:  Normal external examination of the nose and ears. No throat or oropharyngeal swelling or erythema. Midline uvula. Mucous membranes moist.  Neck: Normal range of motion. Supple, no meningeal signs. Trachea midline. No stridor. No JVD  Respiratory/Chest: Clear to auscultation. No respiratory distress.   Cardiovascular: Regular rate and rhythm. No murmurs.  Abdomen:  Bowel sounds intact. No rebound or guarding. Soft.  (+) diffuse mild TTP  Back: No CVA tenderness to percussion. No focal tenderness.  Upper Extremity:  No edema. No cyanosis. Bilateral radial pulses intact and equal.   Lower Extremity:  No edema. No cyanosis. Bilateral calves symmetrical and non-tender.    Skin: Warm and dry. No rash.  Neuro: A&Ox3. CNII -XII intact to testing.  Marland Kitchen   Psychiatric: Normal affect.  Normal insight.          Diagnostic Study Results     Labs -     Results  Procedure Component Value Units Date/Time    Blood Culture Aerobic/Anaerobic #1 [[id number]] Collected: 05/12/19 1855    Specimen: Arm from Blood, Venipuncture Updated: 05/12/19 2153    Narrative:      1 BLUE+1 PURPLE    Blood Culture Aerobic/Anaerobic #2 [[id number]] Collected: 05/12/19 1855    Specimen: Arm from Blood, Venipuncture Updated: 05/12/19 2153    Narrative:      1 BLUE+1 PURPLE    Lactic Acid [[id number]] Collected:  05/12/19 1855    Specimen: Blood Updated: 05/12/19 1916     Lactic Acid 1.8 mmol/L     Glucose Whole Blood - POCT [[id number]]  (Abnormal) Collected: 05/12/19 1746     Updated: 05/12/19 1804     Whole Blood Glucose POCT 304 mg/dL     COVID-19 (SARS-COV-2) ([facility]) [[id number]] Collected: 05/12/19 1611    Specimen: Nasopharyngeal Swab from Nasopharynx Updated: 05/12/19 1648     Purpose of COVID testing Screening     SARS-CoV-2 Specimen Source Nasopharyngeal     SARS CoV 2 Overall Result Negative    Narrative:      o Collect and clearly label specimen type:  o Upper respiratory specimen: One Nasopharyngeal Dry Swab NO  Transport Media.  o Hand deliver to laboratory ASAP  Indication for testing->Extended care facility admission to  semi private room    Urinalysis Reflex to Microscopic Exam- Reflex to Culture [[id number]]  (Abnormal) Collected: 05/12/19 1611    Specimen: Urine Updated: 05/12/19 1646     Urine Type Urine, Catheriz     Color, UA Yellow     Clarity, UA Sl Cloudy     Specific Gravity UA 1.008     Urine pH 9.0     Leukocyte Esterase, UA Moderate     Nitrite, UA Negative     Protein, UR 30     Glucose, UA Negative     Ketones UA Negative     Urobilinogen, UA 4.0 mg/dL      Bilirubin, UA Negative     Blood, UA Large     RBC, UA 26 - 50 /hpf      WBC, UA 26 - 50 /hpf      Urine Mucus Present    Narrative:      Replace urinary catheter prior to obtaining the urine culture  if it has been in place for greater than or equal to 14  days:->N/A No Foley  Indications for U/A Reflex to Micro - Reflex to  Culture:->Suprapubic Pain/Tenderness or Dysuria    Comprehensive metabolic panel [[id number]]  (Abnormal) Collected: 05/12/19 1455    Specimen: Blood Updated: 05/12/19 1531     Glucose 551 mg/dL      BUN 38.0 mg/dL      Creatinine 4.7 mg/dL      Sodium 137 mEq/L      Potassium 3.5 mEq/L      Chloride 100 mEq/L      CO2 24 mEq/L      Calcium 8.3 mg/dL      Protein, Total 6.0 g/dL      Albumin 2.4 g/dL      AST (SGOT) 7  U/L      ALT 12 U/L      Alkaline Phosphatase 114 U/L      Bilirubin, Total 0.4 mg/dL      Globulin 3.6 g/dL      Albumin/Globulin Ratio 0.7     Anion Gap 13.0    Narrative:      Replace  urinary catheter prior to obtaining the urine culture  if it has been in place for greater than or equal to 14  days:->N/A No Foley  Indications for U/A Reflex to Micro - Reflex to  Culture:->Suprapubic Pain/Tenderness or Dysuria    Lipase [[id number]] Collected: 05/12/19 1455    Specimen: Blood Updated: 05/12/19 1531     Lipase 22 U/L     Narrative:      Replace urinary catheter prior to obtaining the urine culture  if it has been in place for greater than or equal to 14  days:->N/A No Foley  Indications for U/A Reflex to Micro - Reflex to  Culture:->Suprapubic Pain/Tenderness or Dysuria    Hemolysis index [[id number]] Collected: 05/12/19 1455     Updated: 05/12/19 1531     Hemolysis Index 3    Narrative:      Replace urinary catheter prior to obtaining the urine culture  if it has been in place for greater than or equal to 14  days:->N/A No Foley  Indications for U/A Reflex to Micro - Reflex to  Culture:->Suprapubic Pain/Tenderness or Dysuria    GFR [[id number]] Collected: 05/12/19 1455     Updated: 05/12/19 1531     EGFR 11.3    Narrative:      Replace urinary catheter prior to obtaining the urine culture  if it has been in place for greater than or equal to 14  days:->N/A No Foley  Indications for U/A Reflex to Micro - Reflex to  Culture:->Suprapubic Pain/Tenderness or Dysuria    CBC and differential [[id number]]  (Abnormal) Collected: 05/12/19 1455    Specimen: Blood Updated: 05/12/19 1508     WBC 4.87 x10 3/uL      Hgb 9.3 g/dL      Hematocrit 29.8 %      Platelets 133 x10 3/uL      RBC 3.15 x10 6/uL      MCV 94.6 fL      MCH 29.5 pg      MCHC 31.2 g/dL      RDW 13 %      MPV 12.9 fL      Neutrophils 76.4 %      Lymphocytes Automated 15.4 %      Monocytes 7.2 %      Eosinophils Automated 0.4 %      Basophils Automated 0.2 %       Immature Granulocytes 0.4 %      Nucleated RBC 0.4 /100 WBC      Neutrophils Absolute 3.72 x10 3/uL      Lymphocytes Absolute Automated 0.75 x10 3/uL      Monocytes Absolute Automated 0.35 x10 3/uL      Eosinophils Absolute Automated 0.02 x10 3/uL      Basophils Absolute Automated 0.01 x10 3/uL      Immature Granulocytes Absolute 0.02 x10 3/uL      Absolute NRBC 0.02 x10 3/uL     Narrative:      Replace urinary catheter prior to obtaining the urine culture  if it has been in place for greater than or equal to 14  days:->N/A No Foley  Indications for U/A Reflex to Micro - Reflex to  Culture:->Suprapubic Pain/Tenderness or Dysuria          Radiologic Studies -   Radiology Results (24 Hour)     Procedure Component Value Units Date/Time    CT Abd/Pelvis without Contrast [[id number]] Collected: 05/12/19 2139    Order Status: Completed Updated: 05/12/19  2144    Narrative:       CT ABDOMEN PELVIS WO IV/ WO PO CONT    CLINICAL HISTORY: Flank pain. Abdominal pain. gen abd pain, acute renal  failure    TECHNIQUE: Axial computed tomography of the abdomen was obtained from  the dome of the diaphragm to the iliac crests. Then, axial spiral CT of  the pelvis was obtained from the iliac crests to the symphysis pubis.  Intravenous contrast was not utilized. Oral contrast was not utilized.  This protocol was used for the evaluation of clinically suspected renal  stones. Lack of IV contrast makes assessment of abdominal and pelvic  structures suboptimal. Lack of oral contrast makes assessment of bowel  and some adjacent structures suboptimal.  CT Dose reduction technique: One or more the following dose reduction  techniques were utilized: Automated exposure control; Adjustment of the  MVA and/or KVP according to patient's size; Use of the iterative  reconstruction technique.    FINDINGS:    There is moderate amount of ascites seen on the 4 quadrants. There is  note of a intraperitoneal catheter probably a peritoneal dialysis   catheter. The fluid could represent dialysis fluid. Please correlate  with history. There is a moderate hiatal hernia, and there is some fluid  seen accumulating within the hernia sac.    No bowel obstruction.    No focal abnormality in the liver, spleen, kidneys, pancreas. No  hydronephrosis. No stones. The gallbladder is slightly distended, and  right upper quadrant fluid accumulation likely related to the ascites.    The uterus is enlarged and calcified fibroids. The urinary bladder shows  and contour. The small amount of air within the urinary bladder, and  could be related to a recent bladder catheterization.        Impression:       INTRAPERITONEAL FLUID/ASCITES (? RELATED TO THE PERITONEAL  DIALYSIS)    Leonard Downing, MD   05/12/2019 9:42 PM      .    Medical Decision Making   I am the first provider for this patient.    I reviewed the vital signs, available nursing notes, past medical history, past surgical history, family history and social history.    Vital Signs-Reviewed the patient's vital signs.     Patient Vitals for the past 12 hrs:   BP Temp Pulse Resp   05/12/19 2117 144/62 99.1 F (37.3 C) 95 17   05/12/19 2000 172/75  92    05/12/19 1930 154/73  94    05/12/19 1900 167/74  88    05/12/19 1830   93    05/12/19 1700 166/73  93    05/12/19 1457 (!) 189/91 98.1 F (36.7 C) 94 18               ED Course:     3 - d/w Dr. Julaine Fusi, accepts for admission    800 - d/w Dr. Eunice Blase nephrology who will consult    Provider Notes: Patient was pancultured and started on vancomycin and Zosyn.     Please choose ONLY A, B, or C for your sepsis documentation      A. ------------Severe Sepsis/Septic Shock Exclusion----------------------------    Pt is excluded from severe sepsis or septic shock consideration at the time of admission at 747 PM [enter time] because of one or more reasons below:    All SIRS, abnl vitals, organ dysfunction are NOT due to severe sepsis or septic shock, but due  to alternative cause:  dehydration [list cause].     The timestamp from the preceding paragraph above also applies to every infectious and/or SEP-1-related diagnosis in Clinical Impression or MDM sections of this note.            Diagnosis     Clinical Impression:   1. AKI (acute kidney injury)        Treatment Plan:   ED Disposition     ED Disposition Condition Date/Time Comment    Admit  Mon May 12, 2019  7:47 PM Admitting Physician: Sheppard Coil [[id number]]   Service:: Medicine [106]   Estimated Length of Stay: > or = to 2 midnights   Tentative Discharge Plan?: Home or Self Care [1]   Does patient need telemetry?: Yes   Telemetry type (separate Telemetry order is also required):: Medical telemetry              _______________________________    This note was generated by the Epic EMR system/ Dragon speech recognition and may contain inherent errors or omissions not intended by the user. Grammatical errors, random word insertions, deletions and pronoun errors  are occasional consequences of this technology due to software limitations. Not all errors are caught or corrected. If there are questions or concerns about the content of this note or information contained within the body of this dictation they should be addressed directly with the author for clarification.      Attestations: This note is prepared by Madalyn Rob, MD    _______________________________       Arvilla Meres, MD  05/12/19 2200

## 2019-05-12 NOTE — H&P (Signed)
[facility]      Patient: [name]  Date: 05/12/2019   DOB: [birth year]  Admission Date: 05/12/2019   MRN: [id number]  Attending: Rebecka Apley  Please contact me on Epic secure chat        Chief Complaint   Patient presents with    Abdominal Pain    Nausea    Emesis    Diarrhea    Fever       Source of History: History is provided by the patient and chart review.     HISTORY AND PHYSICAL     [name] is a 64 y.o. female with history of hypertension, hyperlipidemia, insulin-dependent diabetes mellitus, COPD, end-stage renal disease on peritoneal dialysis who presents to the emergency department for evaluation of a 3-day history of intractable nausea, vomiting and nonbloody diarrhea.  She states that she ate an ultimate omelette at [facility] on 05/09/19 and following that her symptoms started.  She complains of constant diffuse abdominal pain described as aching pain, reaching 10 out of 10 in severity which is exacerbated by movement.  She also reports a fever 102 last night along with chills, feeling dizzy and near syncope, generalized weakness. She reports no history of renal stones.  She does report having a UTI in the past.  She states that she had noted blood in one of her episodes of emesis.  She states that she was hospitalized 1 month ago at [facility] where she had an upper endoscopy but she cannot recall the results.  States that she was started on hemodialysis last May 2020 and after that she was transitioned to peritoneal dialysis at home.  She states that she intermittently makes urine and last week she had frequent urination.  She reports no headache, visual changes, syncope, loss of consciousness, hematochezia, melena, dysuria, hematuria, numbness/tingling, focal weakness.   In the ED, patient was hypertensive on presentation, afebrile, not tachycardic or hypoxic.  Labs significant for glucose of 551, creatinine 4.7.  She received 3 L normal saline bolus and vancomycin and Zosyn  in the ED    Past Medical History:  Past Medical History:   Diagnosis Date    Chronic obstructive pulmonary disease     Diabetes mellitus     Hyperlipidemia     Hypertension        Past Surgical History:  Past Surgical History:   Procedure Laterality Date    JOINT REPLACEMENT      shoulder    JOINT REPLACEMENT      knee    OVARY SURGERY Left        Medications:   Prior to Admission medications    Medication Sig Start Date End Date Taking? Authorizing Provider   amLODIPine-atorvastatin (CADUET) 10-10 MG per tablet Take 1 tablet by mouth daily   Yes [provider]   aspirin EC 81 MG EC tablet Take 81 mg by mouth daily   Yes [provider]   atorvastatin (LIPITOR) 40 MG tablet Take 40 mg by mouth daily   Yes [provider]   carvedilol (COREG) 25 MG tablet Take 25 mg by mouth   Yes [provider]   cilostazol (PLETAL) 100 MG tablet Take 100 mg by mouth 2 (two) times daily   Yes [provider]   famotidine (PEPCID) 40 MG tablet Take 40 mg by mouth daily   Yes [provider]   furosemide (LASIX) 40 MG tablet Take 40 mg by mouth 2 (two)  times daily   Yes [provider]   gabapentin (NEURONTIN) 600 MG tablet Take 600 mg by mouth 3 (three) times daily   Yes [provider]   insulin aspart (NovoLOG) 100 UNIT/ML injection Inject into the skin 3 (three) times daily before meals   Yes [provider]   oxyCODONE-acetaminophen (PERCOCET) 5-325 MG per tablet Take 1 tablet by mouth every 4 (four) hours as needed for Pain   Yes [provider]   sevelamer (RENVELA) 800 MG tablet Take 800 mg by mouth 3 (three) times daily with meals   Yes [provider]       Allergies:   Allergies   Allergen Reactions    Lisinopril Hives    Metformin Hives       Family History:   Family History   Problem Relation Age of Onset    Diabetes Mother     Hypertension Mother     Diabetes Father     Hypertension Father     Diabetes  Sister     Hypertension Sister     Diabetes Brother     Hypertension Brother        Social History: Lives alone and recently moved to [place] from [place].  She states that she has never smoked, does not drink alcohol or use recreational drugs.  She is disabled    REVIEW OF SYSTEMS   ROS: Pertinent positives and negatives as noted in the HPI. All other systems were reviewed and are negative.     PHYSICAL EXAM      Vital Signs (most recent): BP 144/62    Pulse 95    Temp 99.1 F (37.3 C) (Oral)    Resp 17    Ht 1.575 m (5\' 2" )    Wt 112 kg (247 lb)    LMP  (LMP Unknown) Comment: post menopausal   SpO2 95%    BMI 45.18 kg/m   Constiutional: NAD. Pleasant, alert and cooperative   HEENT: NCAT, conjunctivae/corneas clear. No scleral icterus.   Neck: Supple, no meningismus, trachea midline  Cardiovascular: RRR, normal S1 S2, no murmurs, no JVD, no carotid bruits.   Respiratory: Normal rate. CTAB, unlabored respiratory effort, no wheezes, crackles.   Gastrointestinal: +BS, soft, diffuse abdominal tenderness, slightly-distended. No rebound, rigidity or guarding.   Genitourinary: no suprapubic or costovertebral angle tenderness  Musculoskeletal: ROM and motor strength grossly normal. No joint tenderness, deformity or swelling  Skin: no rashes, jaundice or other lesions. normal coloration and turgor  Extremities: atraumatic, no edema. Pulses 2+. Capillary refill < 3s  Neurologic: AAOx3, CN 2-12 intact. No focal deficits noted. Strength and sensation equal and intact  Patient speaks freely in full sentences. Normal speech.   Psychiatric: affect and mood appropriate. The patient is alert, interactive, appropriate.        LABS & IMAGING     Recent Results (from the past 24 hour(s))   CBC and differential    Collection Time: 05/12/19  2:55 PM   Result Value Ref Range    WBC 4.87 3.10 - 9.50 x10 3/uL    Hgb 9.3 (L) 11.4 - 14.8 g/dL    Hematocrit 29.8 (L) 34.7 - 43.7 %    Platelets 133 (L) 142 - 346 x10 3/uL    RBC 3.15 (L) 3.90 - 5.10  x10 6/uL    MCV 94.6 78.0 - 96.0 fL    MCH 29.5 25.1 - 33.5 pg    MCHC 31.2 (L) 31.5 -  35.8 g/dL    RDW 13 11 - 15 %    MPV 12.9 (H) 8.9 - 12.5 fL    Neutrophils 76.4 None %    Lymphocytes Automated 15.4 None %    Monocytes 7.2 None %    Eosinophils Automated 0.4 None %    Basophils Automated 0.2 None %    Immature Granulocytes 0.4 None %    Nucleated RBC 0.4 (H) 0.0 - 0.0 /100 WBC    Neutrophils Absolute 3.72 1.10 - 6.33 x10 3/uL    Lymphocytes Absolute Automated 0.75 0.42 - 3.22 x10 3/uL    Monocytes Absolute Automated 0.35 0.21 - 0.85 x10 3/uL    Eosinophils Absolute Automated 0.02 0.00 - 0.44 x10 3/uL    Basophils Absolute Automated 0.01 0.00 - 0.08 x10 3/uL    Immature Granulocytes Absolute 0.02 0.00 - 0.07 x10 3/uL    Absolute NRBC 0.02 (H) 0.00 - 0.00 x10 3/uL   Comprehensive metabolic panel    Collection Time: 05/12/19  2:55 PM   Result Value Ref Range    Glucose 551 (HH) 70 - 100 mg/dL    BUN 38.0 (H) 7.0 - 19.0 mg/dL    Creatinine 4.7 (H) 0.6 - 1.0 mg/dL    Sodium 137 136 - 145 mEq/L    Potassium 3.5 3.5 - 5.1 mEq/L    Chloride 100 100 - 111 mEq/L    CO2 24 22 - 29 mEq/L    Calcium 8.3 (L) 8.5 - 10.5 mg/dL    Protein, Total 6.0 6.0 - 8.3 g/dL    Albumin 2.4 (L) 3.5 - 5.0 g/dL    AST (SGOT) 7 5 - 34 U/L    ALT 12 0 - 55 U/L    Alkaline Phosphatase 114 (H) 37 - 106 U/L    Bilirubin, Total 0.4 0.2 - 1.2 mg/dL    Globulin 3.6 2.0 - 3.6 g/dL    Albumin/Globulin Ratio 0.7 (L) 0.9 - 2.2    Anion Gap 13.0 5.0 - 15.0   Lipase    Collection Time: 05/12/19  2:55 PM   Result Value Ref Range    Lipase 22 8 - 78 U/L   Hemolysis index    Collection Time: 05/12/19  2:55 PM   Result Value Ref Range    Hemolysis Index 3 0 - 18   GFR    Collection Time: 05/12/19  2:55 PM   Result Value Ref Range    EGFR 11.3    Urinalysis Reflex to Microscopic Exam- Reflex to Culture    Collection Time: 05/12/19  4:11 PM    Specimen: Urine   Result Value Ref Range    Urine Type Urine, Catheriz     Color, UA Yellow Colorless - Yellow     Clarity, UA Sl Cloudy (A) Clear - Hazy    Specific Gravity UA 1.008 1.001 - 1.035    Urine pH 9.0 (A) 5.0 - 8.0    Leukocyte Esterase, UA Moderate (A) Negative    Nitrite, UA Negative Negative    Protein, UR 30 (A) Negative    Glucose, UA Negative Negative    Ketones UA Negative Negative    Urobilinogen, UA 4.0 (A) 0.2 - 2.0 mg/dL    Bilirubin, UA Negative Negative    Blood, UA Large (A) Negative    RBC, UA 26 - 50 (A) 0 - 5 /hpf    WBC, UA 26 - 50 (A) 0 - 5 /hpf    Urine Mucus Present  None   COVID-19 (SARS-COV-2) ([facility])    Collection Time: 05/12/19  4:11 PM    Specimen: Nasopharynx; Nasopharyngeal Swab   Result Value Ref Range    Purpose of COVID testing Screening     SARS-CoV-2 Specimen Source Nasopharyngeal     SARS CoV 2 Overall Result Negative    Glucose Whole Blood - POCT    Collection Time: 05/12/19  5:46 PM   Result Value Ref Range    Whole Blood Glucose POCT 304 (H) 70 - 100 mg/dL   Lactic Acid    Collection Time: 05/12/19  6:55 PM   Result Value Ref Range    Lactic Acid 1.8 0.2 - 2.0 mmol/L       Cultures  Blood Culture: Done  Urine Culture: Done    Antibiotics - Vancomycin and zosyn    IMAGING:   CT abd/pelvis  IMPRESSION:    INTRAPERITONEAL FLUID/ASCITES (? RELATED TO THE PERITONEAL  DIALYSIS)    EMERGENCY DEPARTMENT COURSE:  Orders Placed This Encounter   Procedures    COVID-19 (SARS-COV-2) ([facility])    Urine culture    Blood Culture Aerobic/Anaerobic #1    Blood Culture Aerobic/Anaerobic #2    CT Abd/Pelvis without Contrast    CBC and differential    Comprehensive metabolic panel    Lipase    Urinalysis Reflex to Microscopic Exam- Reflex to Culture    Hemolysis index    GFR    Lactic Acid    Basic Metabolic Panel    CBC without differential    Phosphorus    IRON PROFILE    Hemoglobin A1C    Hemolysis index    Hemolysis index    Diet consistent carbohydrate and renal Protein restriction: 80 GM Protein    Pulse Oximetry    Progressive Mobility Protocol    Notify  physician    I/O    Height    Weight    Skin assessment    Place sequential compression device    Maintain sequential compression device    Education: Activity    Education: Disease Process & Condition    Education: Pain Management    Education: Falls Risk    Education: Smoking Cessation    Telemetry 24 Hour Protocol    Notify Physician (Critical Blood Glucose Value)    Notify physician (Communication: Document Abnormal Blood Glucose)    Target Glucose Goals    Nursing communication    NSG Communication: Glucose POCT order (PRN hypoglycemia)    If NPO, give half NPH dose    Nursing communication: Adult Hypoglycemia Treatment Algorithm    NSG Communication: Glucose POCT order    NSG Communication: Glucose POCT order    NSG Communication: Glucose POCT order    Full Code    Glucose Whole Blood - POCT    Saline lock IV    Saline lock IV    ADMIT TO INPATIENT    Supervise For Meals Frequency: All meals       ASSESSMENT & PLAN     GETHSEMANE FISCHLER is a 64 y.o. female admitted with:      Abdominal pain  Nausea, vomiting, diarrhea  UTI  -Likely due to viral gastroenteritis and UTI  -Lipase wnl so panrecatitis unlikely. Lactate wnl  -CT abd/pelvis wo contrast reveals no evidence of bowel obstruction, bowel ischemia, perforation, diverticulitis, pancreatitis, nephrolithiasis and shows intraperitoneal fluid  -Received vancomycin and zosyn, start rocephin for UTI  -Follow up blood and urine cultures  -  Continue patient's home percocet for moderate pain and IV morphine for severe pain  -PRN antiemetics for nausea    ESRD on peritoneal dialysis  -Started on dialysis on 05/2018  -CT abd/pelvis shows moderate amount of ascites ? Dialysis fluid  -Under care everywhere, noted that Cr from 10/2018-11/2018 has been 3.8-5 range  -PD nurse informed    Hypertension   - continue home amlodipine, coreg    Hyperglycemia  Type 2 DM  -Continue home lantus, SSI, ac/hs accuchecks  -Check A1c    Hyperlipidemia    -Continue home statin    Nutrition   -Diabetic, renal       OTHER  Code Status: Discussed and patient wishes to be DNR  DVT prophylaxis: Heparin  Disposition: Admit to the floor as inpatient status and anticipate length of hospital stay will be more than 2 midnights.    Signed,  Rebecka Apley    05/12/2019 9:25 PM

## 2019-05-13 ENCOUNTER — Encounter: Payer: Self-pay | Admitting: Internal Medicine

## 2019-05-13 LAB — BODY FLUID CELL COUNT
Body Fluid Lymphocytes: 1 %
Body Fluid Monocyte/Macrophage Cells: 3 %
Body Fluid Polymorphonuclear Cell: 96 % — ABNORMAL HIGH (ref <25)
Body Fluid RBC: <2000 /mm3
Body Fluid WBC: 51470 /mm3

## 2019-05-13 LAB — HEMOGLOBIN A1C
Average Estimated Glucose: 234.6 mg/dL
Hemoglobin A1C: 9.8 % — ABNORMAL HIGH (ref 4.6–5.9)

## 2019-05-13 LAB — BASIC METABOLIC PANEL
Anion Gap: 11 (ref 5.0–15.0)
BUN: 37 mg/dL — ABNORMAL HIGH (ref 7.0–19.0)
CO2: 24 mEq/L (ref 22–29)
Calcium: 7.5 mg/dL — ABNORMAL LOW (ref 8.5–10.5)
Chloride: 107 mEq/L (ref 100–111)
Creatinine: 4.5 mg/dL — ABNORMAL HIGH (ref 0.6–1.0)
Glucose: 125 mg/dL — ABNORMAL HIGH (ref 70–100)
Potassium: 3.5 mEq/L (ref 3.5–5.1)
Sodium: 142 mEq/L (ref 136–145)

## 2019-05-13 LAB — HEMOGLOBIN AND HEMATOCRIT, BLOOD
Hematocrit: 28.5 % — ABNORMAL LOW (ref 34.7–43.7)
Hgb: 8.7 g/dL — ABNORMAL LOW (ref 11.4–14.8)

## 2019-05-13 LAB — IRON PROFILE
Iron Saturation: 8 % — ABNORMAL LOW (ref 15–50)
Iron: 11 ug/dL — ABNORMAL LOW (ref 40–145)
TIBC: 141 ug/dL — ABNORMAL LOW (ref 265–497)
UIBC: 130 ug/dL (ref 126–382)

## 2019-05-13 LAB — CBC
Absolute NRBC: 0 10*3/uL (ref 0.00–0.00)
Hematocrit: 26.3 % — ABNORMAL LOW (ref 34.7–43.7)
Hgb: 7.9 g/dL — ABNORMAL LOW (ref 11.4–14.8)
MCH: 29.6 pg (ref 25.1–33.5)
MCHC: 30 g/dL — ABNORMAL LOW (ref 31.5–35.8)
MCV: 98.5 fL — ABNORMAL HIGH (ref 78.0–96.0)
MPV: 12.6 fL — ABNORMAL HIGH (ref 8.9–12.5)
Nucleated RBC: 0 /100 WBC (ref 0.0–0.0)
Platelets: 120 10*3/uL — ABNORMAL LOW (ref 142–346)
RBC: 2.67 10*6/uL — ABNORMAL LOW (ref 3.90–5.10)
RDW: 14 % (ref 11–15)
WBC: 6.62 10*3/uL (ref 3.10–9.50)

## 2019-05-13 LAB — HEMOLYSIS INDEX
Hemolysis Index: 3 (ref 0–18)
Hemolysis Index: 2 (ref 0–18)
Hemolysis Index: 2 (ref 0–18)

## 2019-05-13 LAB — GLUCOSE WHOLE BLOOD - POCT
Whole Blood Glucose POCT: 111 mg/dL — ABNORMAL HIGH (ref 70–100)
Whole Blood Glucose POCT: 128 mg/dL — ABNORMAL HIGH (ref 70–100)
Whole Blood Glucose POCT: 129 mg/dL — ABNORMAL HIGH (ref 70–100)
Whole Blood Glucose POCT: 142 mg/dL — ABNORMAL HIGH (ref 70–100)
Whole Blood Glucose POCT: 166 mg/dL — ABNORMAL HIGH (ref 70–100)
Whole Blood Glucose POCT: 189 mg/dL — ABNORMAL HIGH (ref 70–100)

## 2019-05-13 LAB — FERRITIN: Ferritin: 1070.9 ng/mL — ABNORMAL HIGH (ref 4.60–204.00)

## 2019-05-13 LAB — HEPATITIS B SURFACE ANTIGEN W/ REFLEX TO CONFIRMATION: Hepatitis B Surface Antigen: NONREACTIVE

## 2019-05-13 LAB — PHOSPHORUS: Phosphorus: 3.5 mg/dL (ref 2.3–4.7)

## 2019-05-13 LAB — GFR: EGFR: 11.9

## 2019-05-13 MED ORDER — FAMOTIDINE 20 MG PO TABS
10.0000 mg | ORAL_TABLET | Freq: Two times a day (BID) | ORAL | Status: DC
Start: 2019-05-13 — End: 2019-05-26
  Administered 2019-05-13 – 2019-05-26 (×26): 10 mg via ORAL
  Filled 2019-05-13 (×27): qty 1

## 2019-05-13 MED ORDER — DARBEPOETIN ALFA 60 MCG/0.3ML IJ SOSY
60.00 ug | PREFILLED_SYRINGE | INTRAMUSCULAR | Status: DC
Start: 2019-05-13 — End: 2019-05-26
  Administered 2019-05-13 – 2019-05-20 (×2): 60 ug via SUBCUTANEOUS
  Filled 2019-05-13 (×4): qty 0.3

## 2019-05-13 MED ORDER — PANTOPRAZOLE SODIUM 40 MG IV SOLR
40.00 mg | Freq: Two times a day (BID) | INTRAVENOUS | Status: DC
Start: 2019-05-13 — End: 2019-05-15
  Administered 2019-05-13 – 2019-05-15 (×3): 40 mg via INTRAVENOUS
  Filled 2019-05-13 (×3): qty 40

## 2019-05-13 NOTE — Progress Notes (Signed)
Nutritional Support Services  Nutrition Assessment    [name] 64 y.o. female   MRN: [id number]    Summary of Nutrition Recommendations:  1) Continue current diet as tolerated.   Will continue current protein restriction as within calculated ideal nutrient range    2) Add 1 Nepro po BID with meals. Each Nepro shake provides 425 kcal, 19 gm protein, 250 mg potassium and 170 mg phosphorus.    3) Daily weights    4) Monitor renal labs and BG- adjust insulin regimen as needed for optimal BG control    -----------------------------------------------------------------------------------------------------------------    RN with another pt at time of assessment                                                        Assessment Data:   Referral Source:RN Screen   Reason for Referral: MST 2- decreased appetite/weight loss     Nutrition: Pt seen sitting up in bed, AAOx4. Lethargic throughout interview and complaining of pain (8/10). Interview brief due to pt complaints of abdominal discomfort. Pt reports decreased appetite/po intake x 3 days pta. Appetite remains decreased currently but reports being able to consume 50% of breakfast this AM. Unclear about po intake prior to development of abdominal pain. No reports of emesis but notes feeling nausea. Pt unsure of weight loss- stable weights x 2 months pta and overall no significant weight change x 14 months. NFPE limited due to pt pain but no overt signs of malnutrition per visual assessment- will re-attempt at follow up. No additional issues reported at this time.    Learning Needs: Not currently appropriate due to pain/lethargy; will attach DM/Renal resources to chart for pt to review s/p discharge. Will also attempt to review in person with pt as able/needed    Hospital Admission: 64yo female admitted for abdominal pain, N/V/D, fever; Nephrology following for PD; ABX for UTI    Medical Hx:  has a past medical history of Chronic obstructive pulmonary disease, Diabetes  mellitus, Hyperlipidemia, Hypertension, and Sleep apnea (2018).    PSH: has a past surgical history that includes Ovary surgery (Left); Joint replacement; and Joint replacement.     Orders Placed This Encounter   Procedures   . Diet consistent carbohydrate and renal Protein restriction: 80 GM Protein     Intake: 50% of breakfast this AM per pt report      ANTHROPOMETRIC  Height: 157.5 cm (5' 2.01")  Weight: 112 kg (247 lb)     Weight Change: 0  IBW/kg (Calculated) Female: 53.66 kg  IBW/kg (Calculated) Female: 50.02 kg        Body mass index is 45.17 kg/m.     Weight Monitoring 05/12/2019 05/12/2019   Height 157.5 cm 157.5 cm   Height Method Stated -   Weight 112.038 kg 112.038 kg   Weight Method Stated Bed Scale   BMI (calculated) 45.3 kg/m2 45.3 kg/m2          Weight History Summary: No weight history available per flowsheet. Per review of chart, pt with weight of 250lb (113.6kg) on 03/21/18 and 248lb (112.7kg) on 03/17/19- overall stable weights x 61months (more recently 2 months)    Physical Assessment: 4/20  Limited due to pt lethargy and pain- visual only   Head: orbital region: slightly dark circles, somewhat hollow  look (mild fat loss)  Upper Body: no overt s/s subcutaneous muscle or fat loss  Lower Body: no s/s subcutaneous fat or muscle loss  Edema: no edema per flowsheet   Skin: intact per flowsheet   GI function: LBM 4/19; nausea, abdominal pain, distention     ESTIMATED NEEDS    Total Daily Energy Needs: 2016 to 2576 kcal  Method for Calculating Energy Needs: 18 kcal - 23 kcal per kg  at 112 kg (Actual body weight)  Rationale: BMI > 30, non-ICU, ESRD on PD       Total Daily Protein Needs: 69.8544 to 84.8232 g  Method for Calculating Protein Needs: 1.4 g - 1.7 g per kg at 49.896 kg (Ideal body weight)  Rationale: BMI > 30, non-ICU, ESRD on PD      Total Daily Fluid Needs: 1097.712 to 1247.4 ml  Method for Calculating Fluid Needs: 22 ml - 25 ml  per kg at 49.896 kg (Ideal body weight)  Rationale: BMI >30, ESRD  on PD    Pertinent Medications: Norvasc, Aspirin, Lipitor, Coreg, Rocephin, Heparin, Lantus, Humalog, Renvela    Allergies   Allergen Reactions   . Lisinopril Hives   . Metformin Hives     Pertinent labs: BUN 38, Cr 4.7, Gluc 551, GFR 11.3                                                              Nutrition Diagnosis      Inadequate Protein-energy intake related to decreased appetite, N/V, abdominal pain as evidenced by poor po intake x 3 days pta, current po intake 0-50% per pt report (new)     Altered Nutrition related lab values related to ESRD on PD, DM as evidenced by BUN 38, Cr 4.7, Gluc 551, GFR 11.3                                                                Intervention     Nutrition recommendation - Please refer to top of note                                                               Monitoring/Evaluation     Goals:     1) Pt will consume > 75% of meals and supplements by RD next assessment (new)     Nutrition Risk Level: High (will follow up at least 2 times per week and PRN)   Pending po intake     Edsel Petrin MS, RDN, CSO  Clinical Dietitian  Spectralink-[phone]

## 2019-05-13 NOTE — Plan of Care (Signed)
Problem: Moderate/High Fall Risk Score >5  Goal: Patient will remain free of falls  Outcome: Progressing  Flowsheets (Taken 05/13/2019 0200)  High (Greater than 13):   HIGH-Bed alarm on at all times while patient in bed   HIGH-Visual cue at entrance to patient's room   HIGH-Apply yellow "Fall Risk" arm band   HIGH-Initiate use of floor mats as appropriate   HIGH-Consider use of low bed     Problem: Safety  Goal: Patient will be free from injury during hospitalization  Outcome: Progressing  Flowsheets (Taken 05/13/2019 0827)  Patient will be free from injury during hospitalization:   Provide and maintain safe environment   Assess patient's risk for falls and implement fall prevention plan of care per policy   Use appropriate transfer methods   Ensure appropriate safety devices are available at the bedside   Include patient/ family/ care giver in decisions related to safety   Hourly rounding  Goal: Patient will be free from infection during hospitalization  Outcome: Progressing  Flowsheets (Taken 05/13/2019 0827)  Free from Infection during hospitalization:   Assess and monitor for signs and symptoms of infection   Monitor all insertion sites (i.e. indwelling lines, tubes, urinary catheters, and drains)   Monitor lab/diagnostic results     Problem: Pain  Goal: Pain at adequate level as identified by patient  Outcome: Progressing  Flowsheets (Taken 05/13/2019 0827)  Pain at adequate level as identified by patient:   Identify patient comfort function goal   Assess pain on admission, during daily assessment and/or before any "as needed" intervention(s)   Reassess pain within 30-60 minutes of any procedure/intervention, per Pain Assessment, Intervention, Reassessment (AIR) Cycle   Evaluate if patient comfort function goal is met   Evaluate patient's satisfaction with pain management progress   Offer non-pharmacological pain management interventions     Problem: Compromised Tissue integrity  Goal: Nutritional status is  improving  Outcome: Progressing  Flowsheets (Taken 05/13/2019 0827)  Nutritional status is improving:   Allow adequate time for meals   Include patient/patient care companion in decisions related to nutrition     Problem: Renal Instability  Goal: Fluid and electrolyte balance are achieved/maintained  Outcome: Progressing  Flowsheets (Taken 05/13/2019 0827)  Fluid and electrolyte balance are achieved/maintained:   Monitor intake and output every shift   Monitor/assess lab values and report abnormal values   Provide adequate hydration   Monitor daily weight   Assess and reassess fluid and electrolyte status

## 2019-05-13 NOTE — Plan of Care (Signed)
Problem: Safety  Goal: Patient will be free from injury during hospitalization  Flowsheets (Taken 05/13/2019 0827 by Julien Girt, RN)  Patient will be free from injury during hospitalization:   Provide and maintain safe environment   Assess patient's risk for falls and implement fall prevention plan of care per policy   Use appropriate transfer methods   Ensure appropriate safety devices are available at the bedside   Include patient/ family/ care giver in decisions related to safety   Hourly rounding  Goal: Patient will be free from infection during hospitalization  Flowsheets (Taken 05/13/2019 0827 by Julien Girt, RN)  Free from Infection during hospitalization:   Assess and monitor for signs and symptoms of infection   Monitor all insertion sites (i.e. indwelling lines, tubes, urinary catheters, and drains)   Monitor lab/diagnostic results     Problem: Pain  Goal: Pain at adequate level as identified by patient  Flowsheets (Taken 05/13/2019 0827 by Julien Girt, RN)  Pain at adequate level as identified by patient:   Identify patient comfort function goal   Assess pain on admission, during daily assessment and/or before any "as needed" intervention(s)   Reassess pain within 30-60 minutes of any procedure/intervention, per Pain Assessment, Intervention, Reassessment (AIR) Cycle   Evaluate if patient comfort function goal is met   Evaluate patient's satisfaction with pain management progress   Offer non-pharmacological pain management interventions     Problem: Side Effects from Pain Analgesia  Goal: Patient will experience minimal side effects of analgesic therapy  Flowsheets (Taken 05/13/2019 1949)  Patient will experience minimal side effects of analgesic therapy: Monitor/assess patient's respiratory status (RR depth, effort, breath sounds)     Problem: Compromised Tissue integrity  Goal: Nutritional status is improving  Flowsheets (Taken 05/13/2019 0827 by Julien Girt, RN)  Nutritional  status is improving:   Allow adequate time for meals   Include patient/patient care companion in decisions related to nutrition     Problem: Renal Instability  Goal: Fluid and electrolyte balance are achieved/maintained  Flowsheets (Taken 05/13/2019 0827 by Julien Girt, RN)  Fluid and electrolyte balance are achieved/maintained:   Monitor intake and output every shift   Monitor/assess lab values and report abnormal values   Provide adequate hydration   Monitor daily weight   Assess and reassess fluid and electrolyte status  Goal: Nutritional intake is adequate  Flowsheets (Taken 05/13/2019 1949)  Nutritional intake is adequate:   Assist patient with meals/food selection   Allow adequate time for meals     Problem: Altered GI Function  Goal: Nutritional intake is adequate  Flowsheets (Taken 05/13/2019 1949)  Nutritional intake is adequate:   Assist patient with meals/food selection   Allow adequate time for meals  Goal: Fluid and electrolyte balance are achieved/maintained  Flowsheets (Taken 05/13/2019 1949)  Fluid and electrolyte balance are achieved/maintained: Monitor intake and output every shift  Goal: Mobility/Activity is maintained at optimal level for patient  Flowsheets (Taken 05/13/2019 1949)  Mobility/activity is maintained at optimal level for patient:   Increase mobility as tolerated/progressive mobility   Encourage independent activity per ability

## 2019-05-13 NOTE — Progress Notes (Signed)
One time PD exchange 2.5% Solution 2000cc. done per Dr. Lucio Edward. Cell Ct/ Body Fluid C/S  Specimen collected and sent to lab.; PD Cath dressing done. Site dry, intact. [facility].

## 2019-05-13 NOTE — UM Notes (Signed)
05/12/19 1947  ADMIT TO INPATIENT Once    Diagnosis: Aki (Acute Kidney Injury)    Level of Care: Acute    Patient Class: Inpatient            UTILIZATION REVIEW CONTACT: Name: Laurice Record RN  Clinical Case Manager  - Utilization Review  [facility]  Address:  [address] [place] [address]  NPI:   [id number]  Tax ID:  [id number]  Phone: [phone]  Fax: [phone]  Email: [email or web address]      PATIENT NAME: Sarnowski,Areej L  DOB: 10-21-1955  PMH:  has a past medical history of Chronic obstructive pulmonary disease, Diabetes mellitus, Hyperlipidemia, Hypertension, and Sleep apnea (2018).  PSH:  has a past surgical history that includes Ovary surgery (Left); Joint replacement; and Joint replacement.    ADMISSION REVIEW   History of present illness: Pt is a 64 y.o. female arrived at [facility] on 05/12/2019 at 1444.and admitted to med surg floor    Arrival VS: Vitals BP: (!) 189/91, Temp: 98.1 F (36.7 C), Temp Source: Oral, Heart Rate: 94, Resp Rate: 18, SpO2: 95 %, Height: 157.5 cm (5\' 2" ), Weight: 112 kg (247 lb)      Hgb 9.3Low     Hematocrit 29.8Low     Platelets 133Low     RBC 3.15Low      Clarity, UA Sl CloudyAbnormal     Urine pH 9.0Abnormal     Leukocyte Esterase, UA ModerateAbnormal     Protein, UR 30Abnormal     Urobilinogen, UA 4.0Abnormal     Blood, UA LargeAbnormal     RBC, UA 26 - 50Abnormal     WBC, UA 26 - 50Abnormal      Glucose 551High Panic     BUN 38.0High     Creatinine 4.7High     Calcium 8.3Low     Albumin 2.4Low     Alkaline Phosphatase 114High     Albumin/Globulin Ratio 0.7Low       Urine and blood cultures pending    CT AB/Pelvis: intraperitoneal fluid/ascites (?related to the peritoneal dialysis)  There is moderate amount of ascites seen on the 4 quadrants. There is   note of a intraperitoneal catheter probably a peritoneal dialysis   catheter. The fluid could represent dialysis fluid. Please correlate   with history. There is a moderate hiatal  hernia, and there is some fluid   seen accumulating within the hernia sac.     No bowel obstruction.     No focal abnormality in the liver, spleen, kidneys, pancreas. No   hydronephrosis. No stones. The gallbladder is slightly distended, and   right upper quadrant fluid accumulation likely related to the ascites.     The uterus is enlarged and calcified fibroids. The urinary bladder shows   and contour. The small amount of air within the urinary bladder, and   could be related to a recent bladder catheterization.     ER Medication Administration from 05/12/2019 1418 to 05/12/2019 2115     Date/Time Order Dose Route Action    05/12/2019 1515 sodium chloride 0.9 % bolus 1,000 mL 1,000 mL Intravenous New Bag    05/12/2019 1604 morphine injection 4 mg 4 mg Intravenous Given    05/12/2019 1604 ondansetron (ZOFRAN) injection 4 mg 4 mg Intravenous Given    05/12/2019 1604 labetalol (NORMODYNE,TRANDATE) injection 10 mg 10 mg Intravenous Given    05/12/2019 1900 piperacillin-tazobactam (ZOSYN) 2.25 g in sodium chloride 0.9 % 100 mL  mini-bag plus 2,250 mg Intravenous Given    05/12/2019 1937 vancomycin (VANCOCIN) 1,500 mg in sodium chloride 0.9 % 500 mL IVPB 1,500 mg Intravenous New Bag    05/12/2019 1903 morphine injection 4 mg 4 mg Intravenous Given    05/12/2019 1949 sodium chloride 0.9 % bolus 2,000 mL 2,000 mL Intravenous New Bag     64 y.o. female with history of hypertension, hyperlipidemia, insulin-dependent diabetes mellitus, COPD, end-stage renal disease on peritoneal dialysis  presents to the emergency department for evaluation of a 3-day history of intractable nausea, vomiting and nonbloody diarrhea.  She states that she ate an ultimate omelette at [facility] on 05/09/19 and following that her symptoms started.  She complains of constant diffuse abdominal pain described as aching pain, reaching 10 out of 10 in severity which is exacerbated by movement.  She also reports a fever 102 last night along with chills,  feeling dizzy and near syncope, generalized weakness. She reports no history of renal stones.  She does report having a UTI in the past.  She states that she had noted blood in one of her episodes of emesis.  She states that she was hospitalized 1 month ago at [facility] where she had an upper endoscopy but she cannot recall the results.  States that she was started on hemodialysis last May 2020 and after that she was transitioned to peritoneal dialysis at home.  She states that she intermittently makes urine and last week she had frequent urination.  She reports no headache, visual changes, syncope, loss of consciousness, hematochezia, melena, dysuria, hematuria, numbness/tingling, focal weakness.   In the ED, patient was hypertensive on presentation, afebrile, not tachycardic or hypoxic.  Labs significant for glucose of 551, creatinine 4.7.  She received 3 L normal saline bolus and vancomycin and Zosyn in the ED.    Assessment and Plan  Abdominal pain  Nausea, vomiting, diarrhea  UTI  -Likely due to viral gastroenteritis and UTI  -Lipase wnl so panrecatitis unlikely. Lactate wnl  -CT abd/pelvis wo contrast reveals no evidence of bowel obstruction, bowel ischemia, perforation, diverticulitis, pancreatitis, nephrolithiasis and shows intraperitoneal fluid  -Received vancomycin and zosyn, start rocephin for UTI  -Follow up blood and urine cultures  -Continue patient's home percocet for moderate pain and IV morphine for severe pain  -PRN antiemetics for nausea    ESRD on peritoneal dialysis  -Started on dialysis on 05/2018  -CT abd/pelvis shows moderate amount of ascites ? Dialysis fluid  -Under care everywhere, noted that Cr from 10/2018-11/2018 has been 3.8-5 range  -PD nurse informed    Hypertension   - continue home amlodipine, coreg    Hyperglycemia  Type 2 DM  -Continue home lantus, SSI, ac/hs accuchecks  -Check A1c    Hyperlipidemia   -Continue home statin    Nutrition   -Diabetic, renal       OTHER  Code  Status: Discussed and patient wishes to be DNR  DVT prophylaxis: Heparin    Nephrology to see patient in a.m.    Scheduled Meds:  Current Facility-Administered Medications   Medication Dose Route Frequency    amLODIPine  5 mg Oral Daily    aspirin EC  81 mg Oral Daily    atorvastatin  40 mg Oral Daily    carvedilol  25 mg Oral Q12H SCH    cefTRIAXone  1 g Intravenous Q24H    heparin (porcine)  5,000 Units Subcutaneous Q8H JAARS    insulin glargine  15 Units Subcutaneous  QHS    insulin lispro  1-4 Units Subcutaneous QHS    insulin lispro  1-8 Units Subcutaneous TID AC    insulin lispro  3 Units Subcutaneous TID MEALS    sevelamer  800 mg Oral TID MEALS     Continuous Infusions:  PRN Meds:.Nursing communication: Adult Hypoglycemia Treatment Algorithm **AND** dextrose **AND** dextrose **AND** glucagon (rDNA), morphine, naloxone, ondansetron **OR** ondansetron, oxyCODONE-acetaminophen

## 2019-05-13 NOTE — Progress Notes (Addendum)
[facility]  PROGRESS NOTE      Patient: [name]  Date: 05/13/2019   LOS: 1 Days  Admission Date: 05/12/2019   MRN: [id number]  Attending: Colin Broach  Please contact me on epic chat       ASSESSMENT/PLAN     ITA FRITZSCHE is a 64 y.o. female with history of hypertension, hyperlipidemia, insulin-dependent diabetes mellitus, COPD, end-stage renal disease on peritoneal dialysis who presents to the emergency department for evaluation of a 3-day history of intractable nausea, vomiting and nonbloody diarrhea, admitted for viral gastroenteritis and UTI.    Patient Active Hospital Problem List:    #Coffee ground emesis  #UGIB  -Patient had 1 episode of 400 cc coffee ground emesis per RN  -Start IV Protonix 40 mg BID  -Hold ASA and HSQ  -NPO until GI evaluation  -Follow-up repeat H/H   -GI consulted. Follow-up recommendations    #Abdominal pain  #Nausea, vomiting, diarrhea  #UTI  -Likely due to viral gastroenteritis and UTI  -Lipase WNL so panrecatitis unlikely. Lactate wnl  -CT abd/pelvis wo contrast reveals no evidence of bowel obstruction, bowel ischemia, perforation, diverticulitis, pancreatitis, nephrolithiasis and shows intraperitoneal fluid  -Follow-up blood and urine cultures  -Continue patient's home percocet for moderate pain and IV morphine for severe pain  -PRN antiemetics for nausea  -Follow-up PD fluid for cultures and cell count to rule out peritonitis  -Received vancomycin and zosyn, Continue IV Rocephin for UTI    #ESRD on peritoneal dialysis  -Started on dialysis on 05/2018  -CT abd/pelvis shows moderate amount of ascites ? Dialysis fluid  -Under care everywhere, noted that Cr from 10/2018-11/2018 has been 3.8-5 range  -PD nurse informed  -Nephrology following    #Hypertension   -Continue home amlodipine, coreg    #Hyperglycemia  #Type 2 DM  -Continue home lantus, SSI, ac/hs accuchecks  -Check A1c    #Hyperlipidemia   -Continue home statin    Nutrition  Diabetic, renal diet        DVT prophylaxis   HSQ       Code Status: DNR    DISPO: TBD    Family Contact: none       SUBJECTIVE     Patient seen and examined at bedside.  Patient reports she continues to feel nausea and has diffuse abdominal pain but no further vomiting or diarrhea  Denies cp or sob    MEDICATIONS     Current Facility-Administered Medications   Medication Dose Route Frequency    amLODIPine  5 mg Oral Daily    aspirin EC  81 mg Oral Daily    atorvastatin  40 mg Oral Daily    carvedilol  25 mg Oral Q12H SCH    cefTRIAXone  1 g Intravenous Q24H    heparin (porcine)  5,000 Units Subcutaneous Q8H [place]    insulin glargine  15 Units Subcutaneous QHS    insulin lispro  1-4 Units Subcutaneous QHS    insulin lispro  1-8 Units Subcutaneous TID AC    insulin lispro  3 Units Subcutaneous TID MEALS    sevelamer  800 mg Oral TID MEALS       PHYSICAL EXAM     Vitals:    05/13/19 0920   BP: 125/72   Pulse: 77   Resp: 18   Temp: 99.1 F (37.3 C)   SpO2: 96%       Temperature: Temp  Min: 98.1 F (36.7 C)  Max: 99.3  F (37.4 C)  Pulse: Pulse  Min: 74  Max: 95  Respiratory: Resp  Min: 14  Max: 18  Non-Invasive BP: BP  Min: 111/64  Max: 189/91  Pulse Oximetry SpO2  Min: 95 %  Max: 98 %    Intake and Output Summary (Last 24 hours) at Date Time    Intake/Output Summary (Last 24 hours) at 05/13/2019 1019  Last data filed at 05/13/2019 0655  Gross per 24 hour   Intake 2180 ml   Output 300 ml   Net 1880 ml       GEN APPEARANCE: Normal;  A&OX3  HEENT: PERLA; EOMI; Conjunctiva Clear  NECK: Supple; No bruits  CVS: RRR, S1, S2; No M/G/R  LUNGS: CTAB; No Wheezes; No Rhonchi: No rales  ABD: Soft; diffuse abdominal tenderness; + Normoactive BS  EXT: No edema; Pulses 2+ and intact  Skin exam:  pink  NEURO: CN 2-12 intact; No Focal neurological deficits  CAP REFILL:  Normal  MENTAL STATUS:  Normal    LABS     Recent Labs   Lab 05/12/19  1455   WBC 4.87   RBC 3.15*   Hgb 9.3*   Hematocrit 29.8*   MCV 94.6   Platelets 133*       Recent Labs   Lab  05/12/19  1455   Sodium 137   Potassium 3.5   Chloride 100   CO2 24   BUN 38.0*   Creatinine 4.7*   Glucose 551*   Calcium 8.3*       Recent Labs   Lab 05/12/19  1455   ALT 12   AST (SGOT) 7   Bilirubin, Total 0.4   Albumin 2.4*   Alkaline Phosphatase 114*                   Microbiology Results     Procedure Component Value Units Date/Time    Blood Culture Aerobic/Anaerobic #1 [[id number]] Collected: 05/12/19 1855    Specimen: Arm from Blood, Venipuncture Updated: 05/12/19 2153    Narrative:      1 BLUE+1 PURPLE    Blood Culture Aerobic/Anaerobic #2 [[id number]] Collected: 05/12/19 1855    Specimen: Arm from Blood, Venipuncture Updated: 05/12/19 2153    Narrative:      1 BLUE+1 PURPLE    COVID-19 (SARS-COV-2) ([facility] [facility]) [[id number]] Collected: 05/12/19 1611    Specimen: Nasopharyngeal Swab from Nasopharynx Updated: 05/12/19 1648     Purpose of COVID testing Screening     SARS-CoV-2 Specimen Source Nasopharyngeal     SARS CoV 2 Overall Result Negative     Comment: Test performed using the Abbott ID NOW EUA assay.  Please see Fact Sheets for patients and providers located at:  http://[facility].info/  This test is for the qualitative detection of SARS-CoV-2  (COVID19) nucleic acid. Viral nucleic acids may persist in vivo,  independent of viability. Detection of viral nucleic acid does  not imply the presence of infectious virus, or that virus  nucleic acid is the cause of clinical symptoms. Negative  results should be treated as presumptive and, if inconsistent  with clinical signs and symptoms or necessary for patient  management, should be tested with an alternative molecular  assay. Negative results do not preclude SARS-CoV-2 infection  and should not be used as the sole basis for patient  management decisions. Invalid results may be due to inhibiting  substances in the specimen and recollection should occur.         Narrative:  o Collect and clearly label specimen type:  o Upper respiratory  specimen: One Nasopharyngeal Dry Swab NO  Transport Media.  o Hand deliver to laboratory ASAP  Indication for testing->Extended care facility admission to  semi private room    Urine culture [[id number]] Collected: 05/12/19 1611    Specimen: Bladder Urine Updated: 05/12/19 1646    Narrative:      Replace urinary catheter prior to obtaining the urine culture  if it has been in place for greater than or equal to 14  days:->N/A No Foley  Indications for U/A Reflex to Micro - Reflex to  Culture:->Suprapubic Pain/Tenderness or Dysuria           RADIOLOGY     CT Abd/Pelvis without Contrast    Result Date: 05/12/2019   INTRAPERITONEAL FLUID/ASCITES (? RELATED TO THE PERITONEAL DIALYSIS) Leonard Downing, MD  05/12/2019 9:42 PM      Signed,  Linford Arnold Ryeleigh Santore  10:19 AM 05/13/2019

## 2019-05-13 NOTE — Progress Notes (Addendum)
Pt chart reviewed.  Pt admitted to unit, oriented to Room and Call light.  Pt A&O x 4  Pt on RA and was placed on 24 hour tele  Pt denies CP and SOB, but does c/o pain when inhaling because of abdominal pain.  Pt c/o pain in right mid abdomen, initally 10/10. IV Morphine given. When reassessed, Pt stated pain was 8/10.  Pt has Peritoneal Dialysis Catheter, needs Nephrology consult for scheduling of Peritoneal Dialysis.  Pt did c/o Nausea/Vomiting/Diarrhea, but not during this shift.  Pt is bed-bound right now, states that she cannot walk because of the pain she is experiencing. Before Pt began experiencing the abdominal pain and N/V/D, Pt stated that she was ambulatory with cane.  Pt is considered a High Fall Risk because of a fall within the last 6 months,

## 2019-05-13 NOTE — Consults (Signed)
[place] NOTE  [facility] call: [phone], [phone]  [facility] call: [phone]  After hours call [phone]        Date Time: 05/13/19 3:40 PM  Patient Name: [name]  Requesting Physician: Colin Broach, MD       Reason for Consultation:   GI bleed-Coffee ground emesis, ?melena  Abdominal pain, nausea, vomiting  Diarrhea    Assessment and Plan:   Assessment:  1. GI bleed presenting as coffee ground emesis, melena-Acute onset coffee ground emesis 4/20 PM after lunch, also with ?3 episodes of melena prior to admission. Patient reports prior similar episode 3 weeks ago and underwent EGD at [facility], findings unknown. Ddx: erosive gastritis/esophagitis, PUD, duodenitis, ulcerated polyp.   2. Abdominal pain,nausea, vomiting and non-bloody diarrhea-Symptoms consistent with gastroenteritis, no stool studies performed. S/p culture of PD fluid to rule out peritonitis. CT A/P w/o con on 05/12/19 with moderate hiatal hernia, otherwise no findings. Rocephin 1 gm QD started 05/13/19.  3. Acute on chronic macrocytic anemia- H/H downtrend prior to episode of coffee ground emesis, suspect dilutional as she received 3L NS 05/12/19.  4. UTI  5. ESRD on PD  6. DM2  7. COPD  8. CAD on Pletal (last dose 05/12/19)      Plan:  1.  EGD later this admission pending her H/H trend and clinical status. Continue to hold Pletal as 72 hour washout is recommended.  2. Continue pantoprazole 40 mg IV BID  3. Recommend checking stool culture, ova/parasite, c.diff, crypto/giardia if diarrhea continues.  4. Continue to monitor H/H, monitor for overt bleeding  5. Transfuse PRN for goal hgb >7.   6. Clear liquid diet.  7. Would appreciate primary team assistance in obtaining EGD report from [facility].    History:   NASREEN Young is a 64 y.o. female with history of HTN, HLD, DM2 (insulin dependent) , COPD, ESRD on PD who presents to the hospital on 05/12/2019 with  intractable nausea, vomiting and non-bloody diarrhea. Also with aching abdominal pain, fever up to 102 F at home with with chills, dizziniess and pre-syncope. Onset of symptoms was after eating a [employer] omlette on 05/09/19. She reports that she had 2-3 episodes of non-bloody emesis yesterday and 3 episodes of diarrhea she describes as "black".   She had similar symptoms 3 weeks ago and underwent  GD at [facility], results unknown. She was discharged with Pepcid 40 mg QD which she has been compliant with since discharge. She denies alcohol use, NSAID use, unintentional weight loss. CT A/P without contrast on 05/12/19 revealed moderate amount of ascites seen on the 4 quadrants and fluid specimen was collected today during peritoneal dialysis. Her last colonoscopy about 7 years ago was normal.     Past Medical History:     Past Medical History:   Diagnosis Date    Chronic obstructive pulmonary disease     Diabetes mellitus     Hyperlipidemia     Hypertension     Sleep apnea 2018       Past Surgical History:     Past Surgical History:   Procedure Laterality Date    JOINT REPLACEMENT      shoulder    JOINT REPLACEMENT      knee    OVARY SURGERY Left     Removal       Family History:     Family History   Problem Relation Age of Onset    Diabetes  Mother     Hypertension Mother     Diabetes Father     Hypertension Father     Diabetes Sister     Hypertension Sister     Diabetes Brother     Hypertension Brother     Diabetes Paternal Aunt     Diabetes Paternal Uncle        Social History:     Social History     Socioeconomic History    Marital status: Legally Separated     Spouse name: Not on file    Number of children: Not on file    Years of education: Not on file    Highest education level: Not on file   Occupational History    Not on file   Tobacco Use    Smoking status: Never Smoker    Smokeless tobacco: Never Used   Substance and Sexual Activity    Alcohol use: Never    Drug use:  Never    Sexual activity: Not Currently   Other Topics Concern    Not on file   Social History Narrative    Not on file     Social Determinants of Health     Financial Resource Strain:     Difficulty of Paying Living Expenses:    Food Insecurity:     Worried About Charity fundraiser in the Last Year:     Arboriculturist in the Last Year:    Transportation Needs:     Film/video editor (Medical):     Lack of Transportation (Non-Medical):    Physical Activity:     Days of Exercise per Week:     Minutes of Exercise per Session:    Stress:     Feeling of Stress :    Social Connections:     Frequency of Communication with Friends and Family:     Frequency of Social Gatherings with Friends and Family:     Attends Religious Services:     Active Member of Clubs or Organizations:     Attends Archivist Meetings:     Marital Status:    Intimate Partner Violence:     Fear of Current or Ex-Partner:     Emotionally Abused:     Physically Abused:     Sexually Abused:        Allergies:     Allergies   Allergen Reactions    Lisinopril Hives    Metformin Hives       Medications:     Current Facility-Administered Medications   Medication Dose Route Frequency    amLODIPine  5 mg Oral Daily    aspirin EC  81 mg Oral Daily    atorvastatin  40 mg Oral Daily    carvedilol  25 mg Oral Q12H SCH    cefTRIAXone  1 g Intravenous Q24H    darbepoetin alfa  60 mcg Subcutaneous Weekly    famotidine  10 mg Oral Q12H SCH    heparin (porcine)  5,000 Units Subcutaneous Q8H [place]    insulin glargine  15 Units Subcutaneous QHS    insulin lispro  1-4 Units Subcutaneous QHS    insulin lispro  1-8 Units Subcutaneous TID AC    insulin lispro  3 Units Subcutaneous TID MEALS    sevelamer  800 mg Oral TID MEALS       Review of Systems:   General:  Patient denies lack of appetite, night sweats, weight loss,  fatigue, fever.   HEENT:  Patient denies headache, hoarseness   Cardiovascular:  Patient denies swelling of  hands/feet, fainting/blacking out, chest pain.   Respiratory:  Patient denies chronic cough, difficulty breathing, wheezing.   Genitourinary:  Patient denies blood in urine, dark urine  Musculoskeletal: Patient denies joint pain, joint stiffness, joint swelling.   Skin:  Patient denies itching, rash.   Neurologic:  Patient denies dizziness, loss of consciousness, fainting, confusion  Heme/Lymphatic:  Patient denies easy bruising.       Pertinent positives noted in HPI.    Physical Exam:     Vitals:    05/13/19 1114   BP: 125/65   Pulse:    Resp: 18   Temp:    SpO2: 98%       General appearance: Well developed, well nourished, appears stated age and in NAD  Eyes: Sclera anicteric, pink conjunctivae, no ptosis  ENMT: mucous membranes moist, nose and ears appear normal.  Oropharynx clear.  Chest: Non labored respirations, no audible wheezing, no clubbing or cyanosis  CV:  Regular rate and rhythm, no JVD, no LE edema  Abdomen: obese, diffuse subjective TTP throughout, non-distended, no masses or organomegaly  Skin: Normal color and turgor, no rashes, no suspicious skin lesions noted  Neuro: CN II-XII grossly intact.  No gross movement disorders noted.  Mental status: Appropriate affect, alert and oriented x 3    Labs Reviewed:     Recent Labs     05/13/19  1132 05/12/19  1455   WBC 6.62 4.87   Hgb 7.9* 9.3*   Hematocrit 26.3* 29.8*   Platelets 120* 133*   MCV 98.5* 94.6       Recent Labs     05/13/19  1132 05/12/19  1455   Sodium 142 137   Potassium 3.5 3.5   Chloride 107 100   CO2 24 24   BUN 37.0* 38.0*   Creatinine 4.5* 4.7*   Glucose 125* 551*   Calcium 7.5* 8.3*   Phosphorus 3.5  --        Recent Labs     05/12/19  1455   AST (SGOT) 7   ALT 12   Alkaline Phosphatase 114*   Bilirubin, Total 0.4   Protein, Total 6.0   Albumin 2.4*       No results for input(s): PTT, PT, INR in the last 72 hours.     Radiology:   Radiological Procedure reviewed:    CT A/P without con 05/12/19-There is moderate amount of ascites seen on  the 4 quadrants. There is note of a intraperitoneal catheter probably a peritoneal dialysis  catheter. The fluid could represent dialysis fluid. Please correlate  with history. There is a moderate hiatal hernia, and there is some fluid  seen accumulating within the hernia sac

## 2019-05-13 NOTE — Consults (Signed)
[facility]  CONSULT  Aaron Edelman, x [phone] [facility])      Date Time: 05/13/19 2:31 PM  Patient Name: [name]  Requesting Physician: Colin Broach, MD  Consulting Physician: Nevin Bloodgood, MD      Patient followed by me for PD as outpt at [facility].  Will assume nephrology care starting in a.m.    Thank you.    Signed by: Nevin Bloodgood, MD  [facility]  [phone] (office)  x [phone] ([place])

## 2019-05-13 NOTE — Consults (Signed)
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 female with a past medical history significant for insulin-dependent diabetes mellitus, hypertension, COPD, end-stage renal disease who was started on hemodialysis approximately 6 weeks prior in [place], and recently transition to peritoneal dialysis (CCPD) ,who presents to the hospital with abdominal pain, fever, nausea, vomiting and diarrhea.  She states that  her symptoms started after eating an omelette and a [employer].    Upon initial presentation the emergency room, she was noted a blood pressure of 189/91, pulse of 94 and a temperature of 98.1.  Routine labs, revealed a presenting white count of 4.8, hemoglobin 9.3.  Renal panel, revealed a presenting BUN of 38, creatinine of 4.7 and blood sugars of 551.  CT scan of the abdomen pelvis revealed moderate amount of ascites and intact PD catheter.  She was treated with intravenous fluids and empiric IV antibiotics.  Today's blood work, reveals a creatinine of 4.5 mg/dL.  Urinalysis is suspicious for UTI.  She she states that she urinates every day, denies any dysuria or hematuria.     Past Medical History:     Past Medical History:   Diagnosis Date    Chronic obstructive pulmonary disease     Diabetes mellitus     Hyperlipidemia     Hypertension     Sleep apnea 2018       Past Surgical History:     Past Surgical History:   Procedure Laterality Date    JOINT REPLACEMENT      shoulder    JOINT REPLACEMENT       knee    OVARY SURGERY Left     Removal       Allergies:     Allergies   Allergen Reactions    Lisinopril Hives    Metformin Hives       Medications:     Medications Prior to Admission   Medication Sig    amLODIPine-atorvastatin (CADUET) 10-10 MG per tablet Take 1 tablet by mouth daily    aspirin EC 81 MG EC tablet Take 81 mg by mouth daily    atorvastatin (LIPITOR) 40 MG tablet Take 40 mg by mouth daily    carvedilol (COREG) 25 MG tablet Take 25 mg by mouth    cilostazol (PLETAL) 100 MG tablet Take 100 mg by mouth 2 (two) times daily    famotidine (PEPCID) 40 MG tablet Take 40 mg by mouth daily    furosemide (LASIX) 40 MG tablet Take 40 mg by mouth 2 (two) times daily    gabapentin (NEURONTIN) 600 MG tablet Take 600 mg by mouth 3 (three) times daily    insulin aspart (NovoLOG) 100 UNIT/ML injection Inject into the skin 3 (three) times daily before meals    oxyCODONE-acetaminophen (PERCOCET) 5-325 MG per tablet Take 1 tablet by mouth every 4 (four)  hours as needed for Pain    sevelamer (RENVELA) 800 MG tablet Take 800 mg by mouth 3 (three) times daily with meals       Family History:     Family History   Problem Relation Age of Onset    Diabetes Mother     Hypertension Mother     Diabetes Father     Hypertension Father     Diabetes Sister     Hypertension Sister     Diabetes Brother     Hypertension Brother     Diabetes Paternal Aunt     Diabetes Paternal Uncle        Social History:     Social History     Socioeconomic History    Marital status: Legally Separated     Spouse name: Not on file    Number of children: Not on file    Years of education: Not on file    Highest education level: Not on file   Occupational History    Not on file   Tobacco Use    Smoking status: Never Smoker    Smokeless tobacco: Never Used   Substance and Sexual Activity    Alcohol use: Never    Drug use: Never    Sexual activity: Not Currently   Other Topics Concern    Not on file   Social History  Narrative    Not on file     Social Determinants of Health     Financial Resource Strain:     Difficulty of Paying Living Expenses:    Food Insecurity:     Worried About Charity fundraiser in the Last Year:     Arboriculturist in the Last Year:    Transportation Needs:     Film/video editor (Medical):     Lack of Transportation (Non-Medical):    Physical Activity:     Days of Exercise per Week:     Minutes of Exercise per Session:    Stress:     Feeling of Stress :    Social Connections:     Frequency of Communication with Friends and Family:     Frequency of Social Gatherings with Friends and Family:     Attends Religious Services:     Active Member of Clubs or Organizations:     Attends Music therapist:     Marital Status:    Intimate Partner Violence:     Fear of Current or Ex-Partner:     Emotionally Abused:     Physically Abused:     Sexually Abused:        Review of Systems:   [x] General: weight: [] Stable [] Loss []  Gain [x]  Fever [x] Chills []  Rigors    [x] Respiratory: [x] Shortness of breath [x]  No cough [x] No  wheeze [x]  No hemoptysis    [x] Cardiac: [x]  No Chest pain [x] No  palpitations [x] No  PND [x]  No orthopnea    [x] GI: [x]  abdominal pain [x]   nausea [x]  vomiting [x] No diarrhea []  constipation              [x] Genito-urinary: [x] No dysuria [x] No frequency [x] No  urgency [x] No hematuria                                 [x]  Notrouble voiding    [x] CNS: [x] No focal weakness [x] No ataxia [x] No seizures [] Encephalopathy    [x] Endocrine: [x] Fatigue [x] No polyuria [x]   No polydipsia [x] General weakness    [x] Skin: [x]  No rash [x] No pruritis []   Ecchymosis []  Pressure ulcer    [x] Musculoskeletal: [] negative    Physical Exam:     Vitals:    05/13/19 0138 05/13/19 0426 05/13/19 0920 05/13/19 1114   BP: 125/67 111/64 125/72 125/65   Pulse: 78 74 77    Resp: 14 18 18 18    Temp: 99.3 F (37.4 C) 98.6 F (37 C) 99.1 F (37.3 C)    TempSrc: Oral Oral Oral Oral   SpO2: 96% 96% 96%  98%   Weight:       Height:           Intake and Output Summary (Last 24 hours) at Date Time    Intake/Output Summary (Last 24 hours) at 05/13/2019 1248  Last data filed at 05/13/2019 2836  Gross per 24 hour   Intake 2180 ml   Output 300 ml   Net 1880 ml   NAD  RRR  CTA anteriorly  Soft, Gen tenderness, +BS  +edema  Awake and responsive      Labs:     Recent Labs   Lab 05/13/19  1132 05/12/19  1455   Glucose 125* 551*   BUN 37.0* 38.0*   Creatinine 4.5* 4.7*   Calcium 7.5* 8.3*   Sodium 142 137   Potassium 3.5 3.5   Chloride 107 100   CO2 24 24   Albumin  --  2.4*   Phosphorus 3.5  --    EGFR 11.9 11.3       Recent Labs   Lab 05/12/19  1455   AST (SGOT) 7   ALT 12   Alkaline Phosphatase 114*   Albumin 2.4*   Bilirubin, Total 0.4   Lipase 22       Recent Labs   Lab 05/13/19  1132 05/12/19  1455   WBC 6.62 4.87   Hgb 7.9* 9.3*   Hematocrit 26.3* 29.8*   MCV 98.5* 94.6   MCH 29.6 29.5   MCHC 30.0* 31.2*   RDW 14 13   MPV 12.6* 12.9*   Platelets 120* 133*             Invalid input(s): FREET4    Recent Labs   Lab 05/12/19  1611   Urine Type Urine, Catheriz   Color, UA Yellow   Clarity, UA Sl Cloudy*   Specific Gravity UA 1.008   Urine pH 9.0*   Nitrite, UA Negative   Ketones UA Negative   Urobilinogen, UA 4.0*   Bilirubin, UA Negative   Blood, UA Large*   RBC, UA 26 - 50*   WBC, UA 26 - 50*             Rads:     Radiology Results (24 Hour)     Procedure Component Value Units Date/Time    CT Abd/Pelvis without Contrast [[id number]] Collected: 05/12/19 2139    Order Status: Completed Updated: 05/12/19 2144    Narrative:       CT ABDOMEN PELVIS WO IV/ WO PO CONT    CLINICAL HISTORY: Flank pain. Abdominal pain. gen abd pain, acute renal  failure    TECHNIQUE: Axial computed tomography of the abdomen was obtained from  the dome of the diaphragm to the iliac crests. Then, axial spiral CT of  the pelvis was obtained from the iliac crests to the symphysis pubis.  Intravenous contrast was not utilized. Oral contrast was not  utilized.  This protocol  was used for the evaluation of clinically suspected renal  stones. Lack of IV contrast makes assessment of abdominal and pelvic  structures suboptimal. Lack of oral contrast makes assessment of bowel  and some adjacent structures suboptimal.  CT Dose reduction technique: One or more the following dose reduction  techniques were utilized: Automated exposure control; Adjustment of the  MVA and/or KVP according to patient's size; Use of the iterative  reconstruction technique.    FINDINGS:    There is moderate amount of ascites seen on the 4 quadrants. There is  note of a intraperitoneal catheter probably a peritoneal dialysis  catheter. The fluid could represent dialysis fluid. Please correlate  with history. There is a moderate hiatal hernia, and there is some fluid  seen accumulating within the hernia sac.    No bowel obstruction.    No focal abnormality in the liver, spleen, kidneys, pancreas. No  hydronephrosis. No stones. The gallbladder is slightly distended, and  right upper quadrant fluid accumulation likely related to the ascites.    The uterus is enlarged and calcified fibroids. The urinary bladder shows  and contour. The small amount of air within the urinary bladder, and  could be related to a recent bladder catheterization.        Impression:       INTRAPERITONEAL FLUID/ASCITES (? RELATED TO THE PERITONEAL  DIALYSIS)    Leonard Downing, MD   05/12/2019 9:42 PM            Signed by: Dalbert Mayotte

## 2019-05-14 ENCOUNTER — Encounter
Admission: EM | Disposition: A | Discharge status: Home / Self Care | Payer: Self-pay | Source: Home / Self Care | Attending: Student in an Organized Health Care Education/Training Program

## 2019-05-14 ENCOUNTER — Inpatient Hospital Stay: Payer: Medicaid Other | Admitting: Registered Nurse

## 2019-05-14 ENCOUNTER — Ambulatory Visit: Payer: Self-pay

## 2019-05-14 ENCOUNTER — Encounter: Payer: Self-pay | Admitting: Internal Medicine

## 2019-05-14 DIAGNOSIS — D501 Sideropenic dysphagia: Secondary | ICD-10-CM | POA: Insufficient documentation

## 2019-05-14 DIAGNOSIS — N179 Acute kidney failure, unspecified: Secondary | ICD-10-CM

## 2019-05-14 DIAGNOSIS — D508 Other iron deficiency anemias: Secondary | ICD-10-CM | POA: Insufficient documentation

## 2019-05-14 HISTORY — PX: EGD, BIOPSY: SHX3796

## 2019-05-14 LAB — CBC WITH MANUAL DIFFERENTIAL
Absolute NRBC: 0 10*3/uL (ref 0.00–0.00)
Atypical Lymphocytes %: 1 %
Atypical Lymphocytes Absolute: 0.06 10*3/uL — ABNORMAL HIGH (ref 0.00–0.00)
Band Neutrophils Absolute: 0 10*3/uL (ref 0.00–1.00)
Band Neutrophils: 0 %
Basophils Absolute Manual: 0 10*3/uL (ref 0.00–0.08)
Basophils Manual: 0 %
Cell Morphology: NORMAL
Eosinophils Absolute Manual: 0.06 10*3/uL (ref 0.00–0.44)
Eosinophils Manual: 1 %
Hematocrit: 23.9 % — ABNORMAL LOW (ref 34.7–43.7)
Hgb: 7.1 g/dL — ABNORMAL LOW (ref 11.4–14.8)
Lymphocytes Absolute Manual: 1.47 10*3/uL (ref 0.42–3.22)
Lymphocytes Manual: 26 %
MCH: 29.2 pg (ref 25.1–33.5)
MCHC: 29.7 g/dL — ABNORMAL LOW (ref 31.5–35.8)
MCV: 98.4 fL — ABNORMAL HIGH (ref 78.0–96.0)
MPV: 12.9 fL — ABNORMAL HIGH (ref 8.9–12.5)
Monocytes Absolute: 0.51 10*3/uL (ref 0.21–0.85)
Monocytes Manual: 9 %
Neutrophils Absolute Manual: 3.56 10*3/uL (ref 1.10–6.33)
Nucleated RBC: 0 /100 WBC (ref 0.0–0.0)
Platelet Estimate: DECREASED — AB
Platelets: 124 10*3/uL — ABNORMAL LOW (ref 142–346)
RBC: 2.43 10*6/uL — ABNORMAL LOW (ref 3.90–5.10)
RDW: 13 % (ref 11–15)
Segmented Neutrophils: 63 %
WBC: 5.65 10*3/uL (ref 3.10–9.50)

## 2019-05-14 LAB — GLUCOSE WHOLE BLOOD - POCT
Whole Blood Glucose POCT: 86 mg/dL (ref 70–100)
Whole Blood Glucose POCT: 132 mg/dL — ABNORMAL HIGH (ref 70–100)
Whole Blood Glucose POCT: 114 mg/dL — ABNORMAL HIGH (ref 70–100)
Whole Blood Glucose POCT: 238 mg/dL — ABNORMAL HIGH (ref 70–100)

## 2019-05-14 LAB — COMPREHENSIVE METABOLIC PANEL
ALT: 8 U/L (ref 0–55)
AST (SGOT): 6 U/L (ref 5–34)
Albumin/Globulin Ratio: 0.6 — ABNORMAL LOW (ref 0.9–2.2)
Albumin: 1.6 g/dL — ABNORMAL LOW (ref 3.5–5.0)
Alkaline Phosphatase: 89 U/L (ref 37–106)
Anion Gap: 9 (ref 5.0–15.0)
BUN: 40 mg/dL — ABNORMAL HIGH (ref 7.0–19.0)
Bilirubin, Total: 0.4 mg/dL (ref 0.2–1.2)
CO2: 27 mEq/L (ref 22–29)
Calcium: 7.5 mg/dL — ABNORMAL LOW (ref 8.5–10.5)
Chloride: 106 mEq/L (ref 100–111)
Creatinine: 5.1 mg/dL — ABNORMAL HIGH (ref 0.6–1.0)
Globulin: 2.8 g/dL (ref 2.0–3.6)
Glucose: 87 mg/dL (ref 70–100)
Potassium: 4 mEq/L (ref 3.5–5.1)
Protein, Total: 4.4 g/dL — ABNORMAL LOW (ref 6.0–8.3)
Sodium: 142 mEq/L (ref 136–145)

## 2019-05-14 LAB — BODY FLUID PATH REVIEW-

## 2019-05-14 LAB — HEMOLYSIS INDEX: Hemolysis Index: -1 — ABNORMAL LOW (ref 0–18)

## 2019-05-14 LAB — ECG 12-LEAD
Atrial Rate: 98 {beats}/min
P Axis: 52 degrees
P-R Interval: 122 ms
Q-T Interval: 370 ms
QRS Duration: 76 ms
QTC Calculation (Bezet): 472 ms
R Axis: 42 degrees
T Axis: 23 degrees
Ventricular Rate: 98 {beats}/min

## 2019-05-14 LAB — HEMOGLOBIN AND HEMATOCRIT, BLOOD
Hematocrit: 26.5 % — ABNORMAL LOW (ref 34.7–43.7)
Hgb: 8 g/dL — ABNORMAL LOW (ref 11.4–14.8)

## 2019-05-14 LAB — GFR: EGFR: 10.3

## 2019-05-14 LAB — PHOSPHORUS: Phosphorus: 4.2 mg/dL (ref 2.3–4.7)

## 2019-05-14 SURGERY — ESOPHAGOGASTRODUODENOSCOPY (EGD), BIOPSY
Anesthesia: Anesthesia General | Site: Abdomen

## 2019-05-14 MED ORDER — SODIUM CHLORIDE 0.9 % IV SOLN
INTRAVENOUS | Status: DC
Start: 2019-05-14 — End: 2019-05-26

## 2019-05-14 MED ORDER — PROPOFOL INFUSION 10 MG/ML
INTRAVENOUS | Status: DC | PRN
Start: 2019-05-14 — End: 2019-05-14
  Administered 2019-05-14: 20 mg via INTRAVENOUS
  Administered 2019-05-14: 50 mg via INTRAVENOUS

## 2019-05-14 MED ORDER — LIDOCAINE HCL (PF) 2 % IJ SOLN
INTRAMUSCULAR | Status: AC
Start: 2019-05-14 — End: ?
  Filled 2019-05-14: qty 5

## 2019-05-14 MED ORDER — IRON SUCROSE 20 MG/ML IV SOLN
200.00 mg | INTRAVENOUS | Status: AC
Start: 2019-05-14 — End: 2019-05-16
  Administered 2019-05-14 – 2019-05-16 (×3): 200 mg via INTRAVENOUS
  Filled 2019-05-14 (×4): qty 10

## 2019-05-14 MED ORDER — LIDOCAINE HCL 2 % IJ SOLN
INTRAMUSCULAR | Status: DC | PRN
Start: 2019-05-14 — End: 2019-05-14
  Administered 2019-05-14: 100 mg

## 2019-05-14 MED ORDER — FENTANYL CITRATE (PF) 50 MCG/ML IJ SOLN (WRAP)
INTRAMUSCULAR | Status: DC | PRN
Start: 2019-05-14 — End: 2019-05-14
  Administered 2019-05-14 (×2): 50 ug via INTRAVENOUS

## 2019-05-14 MED ORDER — VANCOMYCIN HCL 1 G IV SOLR
Freq: Once | INTRAVENOUS | Status: DC
Start: 2019-05-14 — End: 2019-05-14
  Filled 2019-05-14: qty 5000

## 2019-05-14 MED ORDER — FENTANYL CITRATE (PF) 50 MCG/ML IJ SOLN (WRAP)
INTRAMUSCULAR | Status: AC
Start: 2019-05-14 — End: ?
  Filled 2019-05-14: qty 2

## 2019-05-14 MED ORDER — LACTATED RINGERS IV SOLN
INTRAVENOUS | Status: DC
Start: 2019-05-14 — End: 2019-05-26

## 2019-05-14 MED ORDER — DIANEAL PD-2/2.5% DEXTROSE 396 MOSM/L IP SOLN
5000.0000 mL | INTRAPERITONEAL | Status: DC
Start: 2019-05-14 — End: 2019-05-14

## 2019-05-14 MED ORDER — VANCOMYCIN HCL 1 G IV SOLR
Freq: Once | INTRAVENOUS | Status: AC
Start: 2019-05-14 — End: 2019-05-14
  Filled 2019-05-14: qty 5000

## 2019-05-14 SURGICAL SUPPLY — 21 items
CANISTER 1000CC (Procedure Accessories) ×2 IMPLANT
FORCEPS BIOPSY L240 CM MICROMESH TEETH STREAMLINE CATHETER NEEDLE (Instrument) IMPLANT
FORCEPS BIOPSY L240 CM STANDARD CAPACITY (Instrument) ×1 IMPLANT
FORCEPS BX STD CPC RJ 4 2.2MM 240CM STRL (Instrument) ×1
GOWN ISO YELLOW UNIVERSAL (Gown) ×4 IMPLANT
JELLY LUB EZ LF STRL H2O SOL NGRS TRNLU (Irrigation Solutions) ×2 IMPLANT
KIT UNIVERSAL IRRIGATION SOL (Kits) ×2 IMPLANT
RESUSCITATOR MANUAL ADULT MASK 2 SWIVEL ELBOW CORRUGATE O2 TUBE SELF (Respiratory Supplies) IMPLANT
RESUSCITATOR MNL ARLF 40IN MSK 2 SWVL (Respiratory Supplies) ×2 IMPLANT
SOFT-CUF 2T ADULT SUB-MIN (Cuff) ×2 IMPLANT
SPONGE GAUZE L4 IN X W4 IN 16 PLY (Dressing) ×10 IMPLANT
SPONGE GAUZE L4 IN X W4 IN 16 PLY MAXIMUM ABSORBENT USP TYPE VII (Dressing) ×10 IMPLANT
SPONGE GZE CTTN CRTY 4X4IN LF NS 16 PLY (Dressing) ×10
SYRINGE 50 ML GRADUATE NONPYROGENIC DEHP (Syringes, Needles) ×1 IMPLANT
SYRINGE 50 ML GRADUATE NONPYROGENIC DEHP FREE PVC FREE BD MEDICAL (Syringes, Needles) ×1 IMPLANT
SYRINGE MED 50ML LF STRL GRAD N-PYRG (Syringes, Needles) ×1
WATER STERILE PLASTIC POUR BOTTLE 1000 (Irrigation Solutions) ×1 IMPLANT
WATER STERILE PLASTIC POUR BOTTLE 1000 ML (Irrigation Solutions) ×1 IMPLANT
WATER STERILE PLASTIC POUR BOTTLE 250 ML (Irrigation Solutions) ×1 IMPLANT
WATER STRL 1000ML LF PLS PR BTL (Irrigation Solutions) ×1
WATER STRL 250ML LF PLS PR BTL (Irrigation Solutions) ×1

## 2019-05-14 NOTE — UM Notes (Signed)
05/13/19-05/14/19 Concurrent Clinical Inpatient Review     05/13/19 Medicine Assessment/Plan:  #Coffee ground emesis  #UGIB  -Patient had 1 episode of 400 cc coffee ground emesis per RN  -Start IV Protonix 40 mg BID  -Hold ASA and HSQ  -NPO until GI evaluation  -Follow-up repeat H/H   -GI consulted. Follow-up recommendations    #Abdominal pain  #Nausea, vomiting, diarrhea  #UTI  -Likelydue toviral gastroenteritis and UTI  -Lipase WNL so panrecatitisunlikely. Lactate wnl  -CT abd/pelvis wo contrastrevealsno evidence of bowel obstruction, bowel ischemia, perforation, diverticulitis, pancreatitis, nephrolithiasis and shows intraperitoneal fluid  -Follow-upblood andurine cultures  -Continue patient's homepercocetfor moderate pain and IV morphine for severe pain  -PRN antiemetics for nausea  -Follow-up PD fluid for cultures and cell count to rule out peritonitis  -Received vancomycin and zosyn, Continue IV Rocephin for UTI    #ESRD on peritoneal dialysis  -Started on dialysis on 05/2018  -CT abd/pelvis shows moderate amount of ascites ? 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   Abdominal pain with fever ?Peritonitis   ?UTI   Anemia   HTN   T2DM    Plan:  Send PD fluid for cultures and cell count  Hold renal replacement therapy  Agree with empiric IV abs  Continue carvedilol and amlodipine  Check iron profile  Start aranesp  Monitor renal  fxn      05/13/2019 11:32  Hemoglobin: 7.9   Hematocrit: 26.3     05/13/2019 17:13  Hemoglobin: 8.7   Hematocrit: 28.5      05/13/2019 11:32  BUN: 37.0   Creatinine: 4.5       05/13/2019 11:32  Calcium: 7.5       05/13/2019 11:32  Iron: 11   TIBC: 141   Iron Saturation: 8 (L)  UIBC: 130  Ferritin: 1,070.90 (H)    Stain, Gram                  05/13/19  Many WBCs        No organisms seen         05/13/2019 11:32  Hemoglobin A1C: 9.8       05/13/2019 14:47  Body Fluid Polymorphonuclear Cell: 96       05/13/19 GI Assessment and Plan:    1. GI bleed presenting as coffee ground emesis, melena-Acute onset coffee ground emesis 4/20 PM after lunch, also with ?3 episodes of melena prior to admission. Patient reports prior similar episode 3 weeks ago and underwent EGD at [facility], findings unknown. Ddx: erosive gastritis/esophagitis, PUD, duodenitis, ulcerated polyp.   2. Abdominal pain,nausea, vomiting and non-bloody diarrhea-Symptoms consistent with gastroenteritis, no stool studies performed. S/p culture of PD fluid to rule out peritonitis. CT A/P w/o con on 05/12/19 with moderate  hiatal hernia, otherwise no findings. Rocephin 1 gm QD started 05/13/19.  3. Acute on chronic macrocytic anemia- H/H downtrend prior to episode of coffee ground emesis, suspect dilutional as she received 3L NS 05/12/19.  4. UTI  5. ESRD on PD  6. DM2  7. COPD  8. CAD on Pletal (last dose 05/12/19)      Plan:  1.  EGD later this admission pending her H/H trend and clinical status. Continue to hold Pletal as 72 hour washout is recommended.  2. Continue pantoprazole 40 mg IV BID  3. Recommend checking stool culture, ova/parasite, c.diff, crypto/giardia if diarrhea continues.  4. Continue to monitor H/H, monitor for overt bleeding  5. Transfuse PRN for goal hgb >7.   6. Clear liquid diet.      05/14/2019 04:28  Hemoglobin: 7.1   Hematocrit: 23.9     05/14/2019 13:06  Hemoglobin: 8.0  Hematocrit: 26.5       05/14/2019  04:28  BUN: 40.0   Creatinine: 5.1      05/14/2019 04:28  Calcium: 7.5     05/14/2019 04:28  Albumin: 1.6   Protein Total: 4.4     05/14/18 Medicine Assessment/Plan:  #UGIB - Patient had 1 episode of 400 cc coffee ground emesis on 4/20 per RN  #Possible melena per patient report  -Continue IV Protonix 40 mg BID  -Hold ASA and Cilostazol  -Continue to monitor H/H  -NPO for EGD today  -GI recommendations appreciated    #Abdominal pain  #Nausea, vomiting, diarrhea  #UTI  -Likelydue toviral gastroenteritis and UTI  -Lipase WNL so panrecatitisunlikely. Lactate wnl  -CT abd/pelvis wo contrastrevealsno evidence of bowel obstruction, bowel ischemia, perforation, diverticulitis, pancreatitis, nephrolithiasis and shows intraperitoneal fluid  -PD fluid count and cultures not indicative of peritonitis   -Blood cultures NGTD  -Urine culture reviewed   -Continue patient's homepercocetfor moderate pain and IV morphine for severe pain  -PRN antiemetics for nausea  -Received vancomycin and zosyn, Continue IV Rocephin for UTI    #ESRD on peritoneal dialysis  -Started on dialysis on 05/2018  -CT abd/pelvis shows moderate amount of ascites ? Dialysis fluid  -Under care everywhere, noted that Cr from 10/2018-11/2018 has been 3.8-5 range  -PD nurse informed  -Nephrology following    #Hypertension  -Continue home amlodipine, coreg    #Hyperglycemia  #Type 2 DM  -Continue home lantus, SSI, ac/hs accuchecks  -HbA1c 9.8%    #Hyperlipidemia   -Continue home statin    #Iron deficiency anemia  -Start IV Venofer - Day 1/3  -Continue to monitor H/H    Nutrition  Diabetic, renal diet      DVT prophylaxis  SCD    Scheduled Meds:  Current Facility-Administered Medications   Medication Dose Route Frequency    amLODIPine  5 mg Oral Daily    atorvastatin  40 mg Oral Daily    carvedilol  25 mg Oral Q12H Burdett    cefTRIAXone  1 g Intravenous Q24H    darbepoetin alfa  60 mcg Subcutaneous Weekly    famotidine  10 mg Oral Q12H Villalba     insulin glargine  15 Units Subcutaneous QHS    insulin lispro  1-4 Units Subcutaneous QHS    insulin lispro  1-8 Units Subcutaneous TID AC    insulin lispro  3 Units Subcutaneous TID MEALS    iron sucrose  200 mg Intravenous Q24H SCH    pantoprazole  40 mg Intravenous BID    sevelamer  800 mg  Oral TID MEALS       morphine injection 2 mg   Dose: 2 mg  Freq: Every 4 hours PRN Route: IVx 3 doses 4/20 and 1 dose 4/21 at 0435    oxyCODONE-acetaminophen (PERCOCET) 5-325 MG per tablet 1 tablet   Dose: 1 tablet  Freq: Every 4 hours PRN Route: Pox 3 doses 4/20 and 1 dose 4/21 at [place]

## 2019-05-14 NOTE — Anesthesia Postprocedure Evaluation (Signed)
Anesthesia Post Evaluation    Patient: [name]    Procedure(s):  EGD, BIOPSY    Anesthesia type: general    Last Vitals:   Vitals Value Taken Time   BP 123/60 05/14/19 1840   Temp 36.4 C (97.5 F) 05/14/19 1835   Pulse 70 05/14/19 1840   Resp 16 05/14/19 1840   SpO2 100 % 05/14/19 1840                 Anesthesia Post Evaluation:           Pain Management: adequate              Cardiovascular status: acceptable  Respiratory status: acceptable  Hydration status: acceptable        Signed by: Ala Dach, 05/14/2019 6:41 PM

## 2019-05-14 NOTE — OR PostOp (Signed)
Received patient in endoscopy recovery s/p EGD.  Report received from CRNA. Bite block removed prior to phase 2 arrival.  Patient saw Dr. Joseph Pierini re procedure results. VSS. Pt denies pain. Tolerated clear liquids well.  Report called to patient's bedside RN at [phone].  Patient transported back to Unit 23.

## 2019-05-14 NOTE — Progress Notes (Signed)
Pt A&O x 4  Pt on RA and 24 hour tele: NSR  Pt c/o pain in abdomen to right of umbilicus, and of back pain attributed by Pt to sciatica. Percocet and IV Morphine were given with minimal relief.  Pt is considered a high fall risk, and states that she cannot ambulate because of the pain in her abdomen.  Pt did not urinate much during the night. Bladder was scanned with residual of 150 mL  Pt stated that she did not get restful sleep last night.

## 2019-05-14 NOTE — Progress Notes (Signed)
CCPD started to continue overnight.     05/14/19 2000   Initiating APD (Putting the Patient ON)    $APD Start Time 1950   Total Treatment Time Ordered (hours) 10 hours   Number of Cycles/Exchanges Ordered 5   Total Fill Volume (mL) 12500 mL   Fluid Orders for APD/CCPD   Cycler Bag #1 2.5 %, Low Calcium in 5000 mL   Cycler Bag #2 2.5 %, Low Calcium in 5000 mL   Cycler Bag #3 2.5 %, Low Calcium in 5000 mL   Completing APD/CCPD (Taking the Patient OFF)    Peritoneal Dialysis Comments CCPD started

## 2019-05-14 NOTE — Plan of Care (Signed)
Problem: Renal Instability  Goal: Fluid and electrolyte balance are achieved/maintained  Outcome: Progressing  Flowsheets (Taken 05/14/2019 2002)  Fluid and electrolyte balance are achieved/maintained: Monitor/assess lab values and report abnormal values  Goal: Free from infection  Outcome: Progressing  Flowsheets (Taken 05/14/2019 2002)  Free from infection: Monitor/assess for signs and symptoms of infection     Problem: Patient Receiving Advanced Renal Therapies  Goal: Therapy access site remains intact  Outcome: Progressing  Flowsheets (Taken 05/14/2019 2002)  Therapy access site remains intact:   Assess therapy access site   Change therapy access site dressing as needed

## 2019-05-14 NOTE — Progress Notes (Signed)
[facility] PROGRESS NOTE  Aaron Edelman, x [phone] [facility] Spectralink)      Date Time: 05/14/19 12:53 PM  Patient Name: [name]  Attending Physician: Colin Broach, MD    CC: follow-up CKD, PD    Assessment:     1. PD associated peritonitis  2. ESRD- on CAPD  3. Anemia- CKD + Fe deficiency now exacerbated by GI bleed  4. Volume overload  5. Hypocalcemia-low albumin  6. ?UTI  7. Type 2 DM- poorly controlled  8. Morbid obesity    Recommendations:       1. Start empiric Vancomycin and Fortaz IP   2. ESA  3. Daily renal panel, cbc  4. Await PD fluid cultures      Case discussed with:  Pt, PD RN    Ulyses Amor, MD  [facility]  [phone] (office)  X [phone] [facility] Spectra-Link)      Subjective: Chart reviwed, pt examined. Recently started on CCPD 12.5 li 1.5 /2.5% depending on weight. Pt recently moved from [place] and is followed by Dr. Aline August at [facility]. Presented with abdominal pain, coffee-ground emesis. Labs confirmed PD-associated peritonitis.  Today she C/o abdominal pain, slight nausea.    Review of Systems:   Review of Systems - No SOB, CP + abd pain    Physical Exam:     Vitals:    05/14/19 0530 05/14/19 0734 05/14/19 0932 05/14/19 1134   BP:  121/70 108/67 129/62   Pulse:  67 74 76   Resp:  18  16   Temp:  98.1 F (36.7 C)  99.1 F (37.3 C)   TempSrc:  Oral  Oral   SpO2:  94%  95%   Weight: 120.1 kg (264 lb 12.4 oz)      Height:           Intake and Output Summary (Last 24 hours) at Date Time    Intake/Output Summary (Last 24 hours) at 05/14/2019 1253  Last data filed at 05/14/2019 0655  Gross per 24 hour   Intake 830 ml   Output 820 ml   Net 10 ml       General: obese female, awake, alert, oriented x 3, no acute distress.  Cardiovascular: regular rate and rhythm, no murmurs, rubs or gallops  Lungs: clear to auscultation bilaterally, without wheezing, rhonchi, or rales  Abdomen: soft, slightly tender diffusely b, non-distended, normoactive bowel sounds; LLQ PD  catheter   Extremities: no clubbing, cyanosis, trace LE  edema      Meds:      Scheduled Meds: PRN Meds:    amLODIPine, 5 mg, Oral, Daily  atorvastatin, 40 mg, Oral, Daily  carvedilol, 25 mg, Oral, Q12H SCH  cefTRIAXone, 1 g, Intravenous, Q24H  darbepoetin alfa, 60 mcg, Subcutaneous, Weekly  famotidine, 10 mg, Oral, Q12H SCH  insulin glargine, 15 Units, Subcutaneous, QHS  insulin lispro, 1-4 Units, Subcutaneous, QHS  insulin lispro, 1-8 Units, Subcutaneous, TID AC  insulin lispro, 3 Units, Subcutaneous, TID MEALS  iron sucrose, 200 mg, Intravenous, Q24H SCH  pantoprazole, 40 mg, Intravenous, BID  sevelamer, 800 mg, Oral, TID MEALS          Continuous Infusions:   dextrose, 15 g of glucose, PRN   And  dextrose, 12.5 g, PRN   And  glucagon (rDNA), 1 mg, PRN  morphine, 2 mg, Q4H PRN  naloxone, 0.2 mg, PRN  ondansetron, 4 mg, Q6H PRN   Or  ondansetron, 4 mg, Q6H PRN  oxyCODONE-acetaminophen, 1 tablet, Q4H PRN              Labs:     Recent Labs   Lab 05/14/19  0428 05/13/19  1713 05/13/19  1132 05/12/19  1455   WBC 5.65  --  6.62 4.87   Hgb 7.1* 8.7* 7.9* 9.3*   Hematocrit 23.9* 28.5* 26.3* 29.8*   Platelets 124*  --  120* 133*     Recent Labs   Lab 05/14/19  0428 05/13/19  1132 05/12/19  1455   Sodium 142 142 137   Potassium 4.0 3.5 3.5   Chloride 106 107 100   CO2 27 24 24    BUN 40.0* 37.0* 38.0*   Creatinine 5.1* 4.5* 4.7*   Calcium 7.5* 7.5* 8.3*   Albumin 1.6*  --  2.4*   Phosphorus 4.2 3.5  --    Glucose 87 125* 551*   EGFR 10.3 11.9 11.3       Recent Labs   Lab 05/12/19  1611   Urine Type Urine, Catheriz   Color, UA Yellow   Clarity, UA Sl Cloudy*   Specific Gravity UA 1.008   Urine pH 9.0*   Nitrite, UA Negative   Ketones UA Negative   Urobilinogen, UA 4.0*   Bilirubin, UA Negative   Blood, UA Large*   RBC, UA 26 - 50*   WBC, UA 26 - 50*           Imaging personally reviewed, including: No results found.        Signed by: Ulyses Amor, MD

## 2019-05-14 NOTE — Progress Notes (Addendum)
[facility]  PROGRESS NOTE      Patient: [name]  Date: 05/14/2019   LOS: 2 Days  Admission Date: 05/12/2019   MRN: [id number]  Attending: Colin Broach  Please contact me on epic chat       ASSESSMENT/PLAN     [name] is a 64 y.o. female with history of hypertension, hyperlipidemia, insulin-dependent diabetes mellitus, COPD, end-stage renal disease on peritoneal dialysis who presents to the emergency department for evaluation of a 3-day history of intractable nausea, vomiting and nonbloody diarrhea, admitted for viral gastroenteritis and UTI.    Patient Active Hospital Problem List:    #UGIB - Patient had 1 episode of 400 cc coffee ground emesis on 4/20 per RN  #Possible melena per patient report  -Continue IV Protonix 40 mg BID  -Hold ASA and Cilostazol  -Continue to monitor H/H  -NPO for EGD today  -GI recommendations appreciated    #GNR Peritonitis  #Abdominal pain  #Nausea, vomiting, diarrhea  #UTI  -Likely due to viral gastroenteritis and UTI  -Peritoneal fluid with 51,000 WBC's. Follow-up fluid culture results  -Lipase WNL so panrecatitis unlikely. Lactate wnl  -CT abd/pelvis wo contrast reveals no evidence of bowel obstruction, bowel ischemia, perforation, diverticulitis, pancreatitis, nephrolithiasis and shows intraperitoneal fluid  -Blood cultures NGTD  -Urine culture reviewed   -Continue patient's home percocet for moderate pain and IV morphine for severe pain  -PRN antiemetics for nausea  -Continue IP Vancomycin and Fortaz with PD exchanges    #ESRD on peritoneal dialysis  -Started on dialysis on 05/2018  -CT abd/pelvis shows moderate amount of ascites ? Dialysis fluid  -Under care everywhere, noted that Cr from 10/2018-11/2018 has been 3.8-5 range  -PD nurse informed  -Nephrology following    #Hypertension   -Continue home amlodipine, coreg    #Hyperglycemia  #Type 2 DM  -Continue home lantus, SSI, ac/hs accuchecks  -HbA1c 9.8%    #Hyperlipidemia   -Continue home  statin    #Iron deficiency anemia  -Start IV Venofer - Day 1/3  -Continue to monitor H/H    Nutrition  Diabetic, renal diet       DVT prophylaxis   SCD     Code Status: DNR    DISPO: TBD    Family Contact: none     SUBJECTIVE   Patient seen and examined at bedside.  Patient still with diffuse abdominal pain and nausea  No further episodes of coffee ground emesis or melena  Denies further diarrhea or vomiting  MEDICATIONS     Current Facility-Administered Medications   Medication Dose Route Frequency    amLODIPine  5 mg Oral Daily    atorvastatin  40 mg Oral Daily    carvedilol  25 mg Oral Q12H SCH    cefTRIAXone  1 g Intravenous Q24H    darbepoetin alfa  60 mcg Subcutaneous Weekly    famotidine  10 mg Oral Q12H SCH    insulin glargine  15 Units Subcutaneous QHS    insulin lispro  1-4 Units Subcutaneous QHS    insulin lispro  1-8 Units Subcutaneous TID AC    insulin lispro  3 Units Subcutaneous TID MEALS    iron sucrose  200 mg Intravenous Q24H SCH    pantoprazole  40 mg Intravenous BID    sevelamer  800 mg Oral TID MEALS       PHYSICAL EXAM     Vitals:    05/14/19 1134   BP: 129/62  Pulse: 76   Resp: 16   Temp: 99.1 F (37.3 C)   SpO2: 95%       Temperature: Temp  Min: 98.1 F (36.7 C)  Max: 99.1 F (37.3 C)  Pulse: Pulse  Min: 67  Max: 77  Respiratory: Resp  Min: 16  Max: 18  Non-Invasive BP: BP  Min: 108/67  Max: 130/73  Pulse Oximetry SpO2  Min: 93 %  Max: 97 %    Intake and Output Summary (Last 24 hours) at Date Time    Intake/Output Summary (Last 24 hours) at 05/14/2019 1210  Last data filed at 05/14/2019 0655  Gross per 24 hour   Intake 830 ml   Output 820 ml   Net 10 ml       GEN APPEARANCE: Normal;  A&OX3  HEENT: PERLA; EOMI; Conjunctiva Clear  NECK: Supple; No bruits  CVS: RRR, S1, S2; No M/G/R  LUNGS: CTAB; No Wheezes; No Rhonchi: No rales  ABD: Soft; diffuse abdominal tenderness; + Normoactive BS  EXT: No edema; Pulses 2+ and intact  Skin exam:  pink  NEURO: CN 2-12 intact; No Focal  neurological deficits  CAP REFILL:  Normal  MENTAL STATUS:  Normal    LABS     Recent Labs   Lab 05/14/19  0428 05/13/19  1713 05/13/19  1132 05/12/19  1455   WBC 5.65  --  6.62 4.87   RBC 2.43*  --  2.67* 3.15*   Hgb 7.1* 8.7* 7.9* 9.3*   Hematocrit 23.9* 28.5* 26.3* 29.8*   MCV 98.4*  --  98.5* 94.6   Platelets 124*  --  120* 133*       Recent Labs   Lab 05/14/19  0428 05/13/19  1132 05/12/19  1455   Sodium 142 142 137   Potassium 4.0 3.5 3.5   Chloride 106 107 100   CO2 27 24 24    BUN 40.0* 37.0* 38.0*   Creatinine 5.1* 4.5* 4.7*   Glucose 87 125* 551*   Calcium 7.5* 7.5* 8.3*       Recent Labs   Lab 05/14/19  0428 05/12/19  1455   ALT 8 12   AST (SGOT) 6 7   Bilirubin, Total 0.4 0.4   Albumin 1.6* 2.4*   Alkaline Phosphatase 89 114*             Microbiology Results     Procedure Component Value Units Date/Time    Blood Culture Aerobic/Anaerobic #1 [[id number]] Collected: 05/12/19 1855    Specimen: Arm from Blood, Venipuncture Updated: 05/13/19 2221    Narrative:      ORDER#: [id number]                                    ORDERED BY: Dwyane Dee, VINOD  SOURCE: Blood, Venipuncture Arm                      COLLECTED:  05/12/19 18:55  ANTIBIOTICS AT COLL.:                                RECEIVED :  05/12/19 21:53  Culture Blood Aerobic and Anaerobic        PRELIM      05/13/19 22:21  05/13/19   No Growth after 1 day/s of incubation.      Blood Culture  Aerobic/Anaerobic #2 [[id number]] Collected: 05/12/19 1855    Specimen: Arm from Blood, Venipuncture Updated: 05/13/19 2221    Narrative:      ORDER#: [id number]                                    ORDERED BY: Dwyane Dee, VINOD  SOURCE: Blood, Venipuncture Arm                      COLLECTED:  05/12/19 18:55  ANTIBIOTICS AT COLL.:                                RECEIVED :  05/12/19 21:53  Culture Blood Aerobic and Anaerobic        PRELIM      05/13/19 22:21  05/13/19   No Growth after 1 day/s of incubation.      COVID-19 (SARS-COV-2) [employer] [facility]) [[id number]] Collected: 05/12/19 1611     Specimen: Nasopharyngeal Swab from Nasopharynx Updated: 05/12/19 1648     Purpose of COVID testing Screening     SARS-CoV-2 Specimen Source Nasopharyngeal     SARS CoV 2 Overall Result Negative     Comment: Test performed using the Abbott ID NOW EUA assay.  Please see Fact Sheets for patients and providers located at:  http://olson-hall.info/  This test is for the qualitative detection of SARS-CoV-2  (COVID19) nucleic acid. Viral nucleic acids may persist in vivo,  independent of viability. Detection of viral nucleic acid does  not imply the presence of infectious virus, or that virus  nucleic acid is the cause of clinical symptoms. Negative  results should be treated as presumptive and, if inconsistent  with clinical signs and symptoms or necessary for patient  management, should be tested with an alternative molecular  assay. Negative results do not preclude SARS-CoV-2 infection  and should not be used as the sole basis for patient  management decisions. Invalid results may be due to inhibiting  substances in the specimen and recollection should occur.         Narrative:      o Collect and clearly label specimen type:  o Upper respiratory specimen: One Nasopharyngeal Dry Swab NO  Transport Media.  o Hand deliver to laboratory ASAP  Indication for testing->Extended care facility admission to  semi private room    Culture + Gram Lenise Arena, Body Fluid [[id number]] Collected: 05/13/19 1447    Specimen: Body Fluid from Ascites Fluid Updated: 05/13/19 2148    Narrative:      ORDER#: [id number]                                    ORDERED BY: Evonnie Dawes  SOURCE: Ascites Fluid ascites                        COLLECTED:  05/13/19 14:47  ANTIBIOTICS AT COLL.:                                RECEIVED :  05/13/19 19:33  Stain, Gram  FINAL       05/13/19 21:48  05/13/19   Many WBCs             No organisms seen  Culture and Gram Stain, Aerobic, Body FluidPENDING      Urine  culture [[id number]] Collected: 05/12/19 1611    Specimen: Bladder Urine Updated: 05/14/19 0909    Narrative:      Replace urinary catheter prior to obtaining the urine culture  if it has been in place for greater than or equal to 14  days:->N/A No Foley  Indications for U/A Reflex to Micro - Reflex to  Culture:->Suprapubic Pain/Tenderness or Dysuria  ORDER#: [id number]                                    ORDERED BY: HAILE, LYDIA  SOURCE: Urine                                        COLLECTED:  05/12/19 16:11  ANTIBIOTICS AT COLL.:                                RECEIVED :  05/12/19 16:26  Culture Urine                              FINAL       05/14/19 09:09  05/14/19   >100,000 CFU/ML of multiple bacterial morphotypes present.             Possible contamination, appropriate recollection is             requested if clinically indicated.            RADIOLOGY     No results found.    Dub Amis Elvie Maines  12:10 PM 05/14/2019

## 2019-05-14 NOTE — Transfer of Care (Signed)
Anesthesia Transfer of Care Note    Patient: [name]    Procedures performed: Procedure(s):  EGD, BIOPSY    Anesthesia type: General TIVA    Patient location:[facility]    Last vitals:   Vitals:    05/14/19 1835   BP: 123/63   Pulse: 70   Resp: 16   Temp: 36.4 C (97.5 F)   SpO2: 100%       Post pain: Patient not complaining of pain, continue current therapy      Mental Status:awake    Respiratory Function: tolerating nasal cannula    Cardiovascular: stable    Nausea/Vomiting: patient not complaining of nausea or vomiting    Hydration Status: adequate    Post assessment: no apparent anesthetic complications, no reportable events and no evidence of recall    Signed by: [place] [facility] Jaidon Ellery  05/14/19 6:36 PM

## 2019-05-14 NOTE — Plan of Care (Signed)
Problem: Moderate/High Fall Risk Score >5  Goal: Patient will remain free of falls  Outcome: Progressing  Flowsheets  Taken 05/14/2019 0831 by Julien Girt, RN  V[facility] High Risk (Greater than 13):   Keep door open for better visibility   Use assistive devices   Request PT/OT therapy consult order from physician for patients with gait/mobility impairment   Use of floor mat   Use of roll guard  Taken 05/14/2019 0800 by Tesfaye, Mimo R, RN  High (Greater than 13): HIGH-Apply yellow "Fall Risk" arm band     Problem: Safety  Goal: Patient will be free from injury during hospitalization  Outcome: Progressing  Flowsheets (Taken 05/13/2019 0827)  Patient will be free from injury during hospitalization:   Provide and maintain safe environment   Assess patient's risk for falls and implement fall prevention plan of care per policy   Use appropriate transfer methods   Ensure appropriate safety devices are available at the bedside   Include patient/ family/ care giver in decisions related to safety   Hourly rounding  Goal: Patient will be free from infection during hospitalization  Outcome: Progressing  Flowsheets (Taken 05/13/2019 0827)  Free from Infection during hospitalization:   Assess and monitor for signs and symptoms of infection   Monitor all insertion sites (i.e. indwelling lines, tubes, urinary catheters, and drains)   Monitor lab/diagnostic results     Problem: Pain  Goal: Pain at adequate level as identified by patient  Outcome: Progressing  Flowsheets (Taken 05/13/2019 0827)  Pain at adequate level as identified by patient:   Identify patient comfort function goal   Assess pain on admission, during daily assessment and/or before any "as needed" intervention(s)   Reassess pain within 30-60 minutes of any procedure/intervention, per Pain Assessment, Intervention, Reassessment (AIR) Cycle   Evaluate if patient comfort function goal is met   Evaluate patient's satisfaction with pain management progress   Offer  non-pharmacological pain management interventions

## 2019-05-14 NOTE — Anesthesia Preprocedure Evaluation (Signed)
Anesthesia Evaluation    AIRWAY    Mallampati: II    TM distance: >3 FB  Neck ROM: limited  Mouth Opening:full  Planned to use difficult airway equipment: No CARDIOVASCULAR           DENTAL           PULMONARY         OTHER FINDINGS                  Relevant Problems   GU/RENAL   (+) AKI (acute kidney injury)               Anesthesia Plan    ASA 3     general                     intravenous induction   Detailed anesthesia plan: general IV            informed consent obtained      pertinent labs reviewed             Signed by: Ala Dach 05/14/19 6:04 PM

## 2019-05-14 NOTE — OR Nursing (Signed)
Endo Tech assisted patient in placing bite block pre-procedure while patient awake without issue.

## 2019-05-15 LAB — COMPREHENSIVE METABOLIC PANEL
ALT: 11 U/L (ref 0–55)
AST (SGOT): 4 U/L — ABNORMAL LOW (ref 5–34)
Albumin/Globulin Ratio: 0.6 — ABNORMAL LOW (ref 0.9–2.2)
Albumin: 1.5 g/dL — ABNORMAL LOW (ref 3.5–5.0)
Alkaline Phosphatase: 91 U/L (ref 37–106)
Anion Gap: 9 (ref 5.0–15.0)
BUN: 33 mg/dL — ABNORMAL HIGH (ref 7.0–19.0)
Bilirubin, Total: 0.3 mg/dL (ref 0.2–1.2)
CO2: 27 mEq/L (ref 22–29)
Calcium: 7.4 mg/dL — ABNORMAL LOW (ref 8.5–10.5)
Chloride: 103 mEq/L (ref 100–111)
Creatinine: 4.7 mg/dL — ABNORMAL HIGH (ref 0.6–1.0)
Globulin: 2.7 g/dL (ref 2.0–3.6)
Glucose: 256 mg/dL — ABNORMAL HIGH (ref 70–100)
Potassium: 3.6 mEq/L (ref 3.5–5.1)
Protein, Total: 4.2 g/dL — ABNORMAL LOW (ref 6.0–8.3)
Sodium: 139 mEq/L (ref 136–145)

## 2019-05-15 LAB — GLUCOSE WHOLE BLOOD - POCT
Whole Blood Glucose POCT: 319 mg/dL — ABNORMAL HIGH (ref 70–100)
Whole Blood Glucose POCT: 107 mg/dL — ABNORMAL HIGH (ref 70–100)
Whole Blood Glucose POCT: 119 mg/dL — ABNORMAL HIGH (ref 70–100)
Whole Blood Glucose POCT: 277 mg/dL — ABNORMAL HIGH (ref 70–100)

## 2019-05-15 LAB — CBC WITH MANUAL DIFFERENTIAL
Absolute NRBC: 0.02 10*3/uL — ABNORMAL HIGH (ref 0.00–0.00)
Atypical Lymphocytes %: 1 %
Atypical Lymphocytes Absolute: 0.04 10*3/uL — ABNORMAL HIGH (ref 0.00–0.00)
Band Neutrophils Absolute: 0 10*3/uL (ref 0.00–1.00)
Band Neutrophils: 0 %
Basophils Absolute Manual: 0 10*3/uL (ref 0.00–0.08)
Basophils Manual: 0 %
Cell Morphology: NORMAL
Eosinophils Absolute Manual: 0.04 10*3/uL (ref 0.00–0.44)
Eosinophils Manual: 1 %
Hematocrit: 24 % — ABNORMAL LOW (ref 34.7–43.7)
Hgb: 7.2 g/dL — ABNORMAL LOW (ref 11.4–14.8)
Lymphocytes Absolute Manual: 0.76 10*3/uL (ref 0.42–3.22)
Lymphocytes Manual: 17 %
MCH: 29 pg (ref 25.1–33.5)
MCHC: 30 g/dL — ABNORMAL LOW (ref 31.5–35.8)
MCV: 96.8 fL — ABNORMAL HIGH (ref 78.0–96.0)
MPV: 12.9 fL — ABNORMAL HIGH (ref 8.9–12.5)
Monocytes Absolute: 0.31 10*3/uL (ref 0.21–0.85)
Monocytes Manual: 7 %
Neutrophils Absolute Manual: 3.32 10*3/uL (ref 1.10–6.33)
Nucleated RBC: 0.4 /100 WBC — ABNORMAL HIGH (ref 0.0–0.0)
Platelet Estimate: DECREASED — AB
Platelets: 122 10*3/uL — ABNORMAL LOW (ref 142–346)
RBC: 2.48 10*6/uL — ABNORMAL LOW (ref 3.90–5.10)
RDW: 13 % (ref 11–15)
Segmented Neutrophils: 74 %
WBC: 4.48 10*3/uL (ref 3.10–9.50)

## 2019-05-15 LAB — GFR: EGFR: 11.3

## 2019-05-15 LAB — HEMOLYSIS INDEX: Hemolysis Index: 1 (ref 0–18)

## 2019-05-15 MED ORDER — DIANEAL LOW CALCIUM/2.5% DEX 395 MOSM/L IP SOLN
INTRAPERITONEAL | Status: DC
Start: 2019-05-15 — End: 2019-05-15
  Filled 2019-05-15: qty 5000

## 2019-05-15 MED ORDER — PANTOPRAZOLE SODIUM 40 MG PO TBEC
40.00 mg | DELAYED_RELEASE_TABLET | Freq: Two times a day (BID) | ORAL | Status: DC
Start: 2019-05-15 — End: 2019-05-26
  Administered 2019-05-15 – 2019-05-26 (×22): 40 mg via ORAL
  Filled 2019-05-15 (×23): qty 1

## 2019-05-15 MED ORDER — DIANEAL LOW CALCIUM/2.5% DEX 395 MOSM/L IP SOLN
Freq: Every day | INTRAPERITONEAL | Status: DC
Start: 2019-05-15 — End: 2019-05-15
  Filled 2019-05-15: qty 5000

## 2019-05-15 MED ORDER — DIANEAL LOW CALCIUM/2.5% DEX 395 MOSM/L IP SOLN
INTRAPERITONEAL | Status: DC
Start: 2019-05-15 — End: 2019-05-16
  Filled 2019-05-15 (×2): qty 5000

## 2019-05-15 NOTE — Progress Notes (Signed)
CCPD completed w/ no flow issue overnight; Total UF 628cc; Effluent fluid- yellow, cloudy. Cath. Dressing intact.     05/15/19 0845   Completing APD/CCPD (Taking the Patient OFF)    Effluent Appearance Cloudy;Yellow   Specimen Collected No   Volume Collected in Drainage Bag (mL) 628 mL   Peritoneal Dialysis Comments CCPD completed    Report given to Jory Sims RN

## 2019-05-15 NOTE — Progress Notes (Signed)
CCPD tx started; No flow problem encountered. Report given to Jory Sims RN.     05/15/19 1804   Initiating APD (Putting the Patient ON)    $APD Start Time 1800   Total Treatment Time Ordered (hours) 10 hours   Number of Cycles/Exchanges Ordered 5   Total Fill Volume (mL) 12500 mL   Fluid Orders for APD/CCPD   Cycler Bag #1 2.5 %, Low Calcium in 5000 mL   Cycler Bag #2 2.5 %, Low Calcium in 5000 mL   Cycler Bag #3 2.5 %, Low Calcium in 5000 mL   Completing APD/CCPD (Taking the Patient OFF)    Peritoneal Dialysis Comments CCPD  started   Mid-Day Manual Exchange    Is there a mid-day exchange? No   Peritoneal Dialysis Catheter   No Placement Date or Time found.   Present on Admission?: Yes  Catheter Type: Baxter   Site Assessment Clean;Dry;Intact   Dressing/Line Intervention Dressing changed   Status Accessed   Site Condition No complications   Dressing Status Clean;Dry;Intact   Dressing Occlusive

## 2019-05-15 NOTE — Progress Notes (Signed)
[place] NOTE  [facility] call: [id number], [phone]  [facility] call: [phone]  After hours call [phone]        Date Time: 05/15/19 1:16 PM  Patient Name: [name]  Requesting Physician: Colin Broach, MD       Reason for Consultation:   GI bleed-Coffee ground emesis, ?melena  Grade D esophagitis  Abdominal pain, nausea, vomiting  Diarrhea    Assessment and Plan:   Assessment:  1. GI bleed presenting as coffee ground emesis, melena-Acute onset coffee ground emesis 4/20 PM after lunch, also with ?3 episodes of melena prior to admission. S/p EGD 05/14/19 5 cm hiatal hernia, LA grade D esophagitis, normal stomach, normal duodenum. No further episodes.  2. Abdominal pain,nausea, vomiting and non-bloody diarrhea-Symptoms consistent with gastroenteritis with likely contribution from secondary peritonitis.  3. Secondary peritonitis-PD fluid cultures with gram neg rod 05/14/19, ID following.  4. Acute on chronic macrocytic anemia- H/H drifting, no further overt bleeding. Chronic anemia secondary to ESRD.  4. UTI  5. ESRD on PD  6. DM2  7. COPD  8. CAD on Pletal (last dose 05/12/19)      Plan:  1. Continue pantoprazole 40 mg PO BID  2. ID management of peritonitis.  3. She will need outpatient follow-up for repeat EGD in 2 months.  4. Continue to monitor for overt GI bleeding.  5. She is OK to resume Pletal.  6. Follow-up EGD biopsies.     We will sign off, please call with questions.     History:   She is sitting comfortably in bed. She offers no complaints. Her abdominal pain has improved.     Past Medical History:     Past Medical History:   Diagnosis Date    Chronic obstructive pulmonary disease     Diabetes mellitus     Hyperlipidemia     Hypertension     Sleep apnea 2018       Past Surgical History:     Past Surgical History:   Procedure Laterality Date    EGD, BIOPSY N/A 05/14/2019    Procedure: EGD, BIOPSY;  Surgeon: Ronie Spies, MD;  Location:  [facility];  Service: Gastroenterology;  Laterality: N/A;    JOINT REPLACEMENT      shoulder    JOINT REPLACEMENT      knee    OVARY SURGERY Left     Removal       Family History:     Family History   Problem Relation Age of Onset    Diabetes Mother     Hypertension Mother     Diabetes Father     Hypertension Father     Diabetes Sister     Hypertension Sister     Diabetes Brother     Hypertension Brother     Diabetes Paternal Aunt     Diabetes Paternal Uncle        Social History:     Social History     Socioeconomic History    Marital status: Legally Separated     Spouse name: Not on file    Number of children: Not on file    Years of education: Not on file    Highest education level: Not on file   Occupational History    Not on file   Tobacco Use    Smoking status: Never Smoker    Smokeless tobacco: Never Used   Substance and Sexual Activity    Alcohol use: Never  Drug use: Never    Sexual activity: Not Currently   Other Topics Concern    Not on file   Social History Narrative    Not on file     Social Determinants of Health     Financial Resource Strain:     Difficulty of Paying Living Expenses:    Food Insecurity:     Worried About Running Out of Food in the Last Year:     Arboriculturist in the Last Year:    Transportation Needs:     Film/video editor (Medical):     Lack of Transportation (Non-Medical):    Physical Activity:     Days of Exercise per Week:     Minutes of Exercise per Session:    Stress:     Feeling of Stress :    Social Connections:     Frequency of Communication with Friends and Family:     Frequency of Social Gatherings with Friends and Family:     Attends Religious Services:     Active Member of Clubs or Organizations:     Attends Archivist Meetings:     Marital Status:    Intimate Partner Violence:     Fear of Current or Ex-Partner:     Emotionally Abused:     Physically Abused:     Sexually Abused:        Allergies:     Allergies    Allergen Reactions    Lisinopril Hives    Metformin Hives       Medications:     Current Facility-Administered Medications   Medication Dose Route Frequency    amLODIPine  5 mg Oral Daily    atorvastatin  40 mg Oral Daily    carvedilol  25 mg Oral Q12H SCH    darbepoetin alfa  60 mcg Subcutaneous Weekly    famotidine  10 mg Oral Q12H SCH    insulin glargine  15 Units Subcutaneous QHS    insulin lispro  1-4 Units Subcutaneous QHS    insulin lispro  1-8 Units Subcutaneous TID AC    insulin lispro  3 Units Subcutaneous TID MEALS    iron sucrose  200 mg Intravenous Q24H SCH    pantoprazole  40 mg Oral BID    sevelamer  800 mg Oral TID MEALS       Review of Systems:   General:  Patient denies lack of appetite, night sweats, weight loss, fatigue, fever.   HEENT:  Patient denies headache, hoarseness   Cardiovascular:  Patient denies swelling of hands/feet, fainting/blacking out, chest pain.   Respiratory:  Patient denies chronic cough, difficulty breathing, wheezing.   Genitourinary:  Patient denies blood in urine, dark urine  Musculoskeletal: Patient denies joint pain, joint stiffness, joint swelling.   Skin:  Patient denies itching, rash.   Neurologic:  Patient denies dizziness, loss of consciousness, fainting, confusion  Heme/Lymphatic:  Patient denies easy bruising.       Pertinent positives noted in HPI.    Physical Exam:     Vitals:    05/15/19 1225   BP: 112/63   Pulse: 69   Resp: 20   Temp: 99.1 F (37.3 C)   SpO2: 97%       General appearance: Well developed, well nourished, appears stated age and in NAD  Eyes: Sclera anicteric, pink conjunctivae, no ptosis  ENMT: mucous membranes moist, nose and ears appear normal.  Oropharynx clear.  Chest: Non labored  respirations, no audible wheezing, no clubbing or cyanosis  CV:  Regular rate and rhythm, no JVD, no LE edema  Abdomen: obese, non-tender throughout, non-distended, no masses or organomegaly  Skin: Normal color and turgor, no rashes, no suspicious  skin lesions noted  Neuro: CN II-XII grossly intact.  No gross movement disorders noted.  Mental status: Appropriate affect, alert and oriented x 3    Labs Reviewed:     Recent Labs     05/15/19  0551 05/14/19  1306 05/14/19  0428 05/14/19  0428   WBC 4.48  --   --  5.65   Hgb 7.2* 8.0*   < > 7.1*   Hematocrit 24.0* 26.5*   < > 23.9*   Platelets 122*  --   --  124*   MCV 96.8*  --   --  98.4*    < > = values in this interval not displayed.       Recent Labs     05/15/19  0957 05/14/19  0428 05/13/19  1132 05/13/19  1132   Sodium 139 142   < > 142   Potassium 3.6 4.0   < > 3.5   Chloride 103 106   < > 107   CO2 27 27   < > 24   BUN 33.0* 40.0*   < > 37.0*   Creatinine 4.7* 5.1*   < > 4.5*   Glucose 256* 87   < > 125*   Calcium 7.4* 7.5*   < > 7.5*   Phosphorus  --  4.2  --  3.5    < > = values in this interval not displayed.       Recent Labs     05/15/19  0957 05/14/19  0428   AST (SGOT) 4* 6   ALT 11 8   Alkaline Phosphatase 91 89   Bilirubin, Total 0.3 0.4   Protein, Total 4.2* 4.4*   Albumin 1.5* 1.6*       No results for input(s): PTT, PT, INR in the last 72 hours.     Radiology:   Radiological Procedure reviewed:    CT A/P without con 05/12/19-There is moderate amount of ascites seen on the 4 quadrants. There is note of a intraperitoneal catheter probably a peritoneal dialysis  catheter. The fluid could represent dialysis fluid. Please correlate  with history. There is a moderate hiatal hernia, and there is some fluid  seen accumulating within the hernia sac      Endoscopy:     EGD 05/14/19 ([facility]): 5 cm hiatal hernia, LA grade D esophagitis (bx), normal stomach, normal duodenum

## 2019-05-15 NOTE — UM Notes (Signed)
[facility] Utilization Review  NPI: [id number], Tax ID: [id number]  Please Call Krystal Eaton, MSN, RN @ [phone] with any questions or concerns. Confidential voicemail.  Email: [email or web address]   Fax final authorizations and requests for additional information to [phone]    05/15/19 Continued Stay Review     ASSESSMENT/PLAN    [name] a 64 y.o.femalewith history ofhypertension, hyperlipidemia, insulin-dependent diabetes mellitus, COPD, end-stage renal disease on peritoneal dialysiswho presents to the emergency department for evaluation of a 3-day history of intractable nausea,vomiting and nonbloody diarrhea, admitted for viral gastroenteritis and UTI.    Patient Active Hospital Problem List:    #UGIB - Patient had 1 episode of 400 cc coffee ground emesis on 4/20 per RN  #Possible melena per patient report  -s/p EGD on 4/21: Grade D gastritis  -Continue Protonix 40 mg BID  -Hold ASA and Cilostazol  -Continue to monitor H/H  -GI recommendations appreciated. Outpatient follow-up for repeat EGD.    #GNR Peritonitis  #Abdominal pain  #Nausea, vomiting, diarrhea - Resolved  #UTI  -Likelydue toviral gastroenteritis/UTI/Peritonitis  -Peritoneal fluid with 51,000 WBC's. Follow-up fluid culture results  -Blood cultures NGTD, lactic acid normal and lipase normal  -Urine culture: >100,000 CFU/ML of multiple bacterial morphotypes   -CT abd/pelvis wo contrastrevealsno evidence of bowel obstruction, bowel ischemia, perforation, diverticulitis, pancreatitis, nephrolithiasis and shows intraperitoneal fluid  -Continue patient's homepercocetfor moderate pain and IV morphine for severe pain  -PRN antiemetics for nausea/vomiting  -Follow-up stool studies if she has recurrent diarrhea  -Continue IP Fortaz with PD exchanges  -ID recommendations appreciated    #ESRD on peritoneal dialysis  -Started on dialysis on 05/2018  -CT abd/pelvis shows moderate amount of ascites ? Dialysis fluid   -Under care everywhere, noted that Cr from 10/2018-11/2018 has been 3.8-5 range  -Continue PD with IP antibiotics  -Nephrology following    #Hypertension  -Continue home amlodipine, coreg    #Hyperglycemia  #Type 2 DM  -Continue home lantus, SSI, ac/hs accuchecks  -HbA1c 9.8%    #Hyperlipidemia   -Continue home statin    #Iron deficiency anemia  -Continue IV Venofer - Day 2/3  -Continue to monitor H/H    Nutrition  Diabetic, renal diet      DVT prophylaxis  SCD    SUBJECTIVE   Patient seen and examined at bedside.  Patient reports no improvement in her abdominal pain.     Temp:  [97.5 F (36.4 C)-99.1 F (37.3 C)] 99.1 F (37.3 C)  Heart Rate:  [65-78] 69  Resp Rate:  [12-20] 20  BP: (112-163)/(59-96) 112/63     Lab Results last 48 Hours     Procedure Component Value Units Date/Time    Glucose Whole Blood - POCT [[id number]]  (Abnormal) Collected: 05/15/19 1222     Updated: 05/15/19 1229     Whole Blood Glucose POCT 107 mg/dL     Comprehensive metabolic panel [[id number]]  (Abnormal) Collected: 05/15/19 0957    Specimen: Blood Updated: 05/15/19 1038     Glucose 256 mg/dL      BUN 33.0 mg/dL      Creatinine 4.7 mg/dL      Calcium 7.4 mg/dL      Protein, Total 4.2 g/dL      Albumin 1.5 g/dL      AST (SGOT) 4 U/L      Albumin/Globulin Ratio 0.6     Anion Gap 9.0    Hemolysis index [[id number]] Collected: 05/15/19 0957     Updated: 05/15/19  1038     Hemolysis Index 1    GFR [[id number]] Collected: 05/15/19 0957     Updated: 05/15/19 1038     EGFR 11.3    Glucose Whole Blood - POCT [[id number]]  (Abnormal) Collected: 05/15/19 0820     Updated: 05/15/19 0827     Whole Blood Glucose POCT 319 mg/dL     CBC WITH MANUAL DIFFERENTIAL [[id number]]  (Abnormal) Collected: 05/15/19 0551     Updated: 05/15/19 0750     WBC 4.48 x10 3/uL      Hgb 7.2 g/dL      Hematocrit 24.0 %      Platelets 122 x10 3/uL      RBC 2.48 x10 6/uL      MCV 96.8 fL      MCH 29.0 pg      MCHC 30.0 g/dL      RDW 13 %      MPV 12.9 fL      Nucleated  RBC 0.4 /100 WBC      Absolute NRBC 0.02 x10 3/uL      Atypical Lymphocytes Absolute 0.04 x10 3/uL      Cell Morphology Normal     Platelet Estimate Decreased        Scheduled Meds:  Current Facility-Administered Medications  Medication Dose Route Frequency   amLODIPine  5 mg Oral Daily   atorvastatin  40 mg Oral Daily   carvedilol  25 mg Oral Q12H SCH   darbepoetin alfa  60 mcg Subcutaneous Weekly   famotidine  10 mg Oral Q12H Byron   insulin glargine  15 Units Subcutaneous QHS   insulin lispro  1-4 Units Subcutaneous QHS   insulin lispro  1-8 Units Subcutaneous TID AC   insulin lispro  3 Units Subcutaneous TID MEALS   iron sucrose  200 mg Intravenous Q24H SCH   pantoprazole  40 mg Oral BID   sevelamer  800 mg Oral TID MEALS    Continuous Infusions:   sodium chloride 100 mL/hr at 05/14/19 1828   peritoneal dialysis with additives     lactated ringers Stopped (05/15/19 1409)    PRN Meds:.Nursing communication: Adult Hypoglycemia Treatment Algorithm **AND** dextrose **AND** dextrose **AND** glucagon (rDNA), morphine, naloxone, ondansetron **OR** ondansetron, oxyCODONE-acetaminophen    Per ID note:  Assessment  1. PD cath related peritonitis/ GNR    Recommendations  1. Check final sensitivites  2. [place] dianeal vanco  3. Dianeal Fortaz pending sensitivites  4.  If pain persists despite treatment may need to have the catheter removed.    Per Nephrology note:  Assessment:     1. PD associated peritonitis (wbc 51,000)  2. ESRD- on CAPD  3. Anemia- CKD + Fe deficiency now exacerbated by GI bleed  4. Volume overload  5. Hypocalcemia-low albumin  6. ?UTI  7. Type 2 DM- poorly controlled  8. Morbid obesity    Recommendations:       Follow up on PD fluid cultures  Continue current PD exchanges with IP antibiotics      Krystal Eaton, RN, MSN  Utilization Review Case Management  [phone] (F)

## 2019-05-15 NOTE — Progress Notes (Signed)
[facility]  PROGRESS NOTE      Patient: [name]  Date: 05/15/2019   LOS: 3 Days  Admission Date: 05/12/2019   MRN: [id number]  Attending: Colin Broach  Please contact me on epic chat       ASSESSMENT/PLAN     [name] is a 64 y.o. female with history of hypertension, hyperlipidemia, insulin-dependent diabetes mellitus, COPD, end-stage renal disease on peritoneal dialysis who presents to the emergency department for evaluation of a 3-day history of intractable nausea, vomiting and nonbloody diarrhea, admitted for viral gastroenteritis and UTI.    Patient Active Hospital Problem List:    #UGIB - Patient had 1 episode of 400 cc coffee ground emesis on 4/20 per RN  #Possible melena per patient report  -s/p EGD on 4/21: Grade D gastritis  -Continue Protonix 40 mg BID  -Hold ASA and Cilostazol  -Continue to monitor H/H  -GI recommendations appreciated. Outpatient follow-up for repeat EGD.    #GNR Peritonitis  #Abdominal pain  #Nausea, vomiting, diarrhea - Resolved  #UTI  -Likely due to viral gastroenteritis/UTI/Peritonitis  -Peritoneal fluid with 51,000 WBC's. Follow-up fluid culture results  -Blood cultures NGTD, lactic acid normal and lipase normal  -Urine culture: >100,000 CFU/ML of multiple bacterial morphotypes   -CT abd/pelvis wo contrast reveals no evidence of bowel obstruction, bowel ischemia, perforation, diverticulitis, pancreatitis, nephrolithiasis and shows intraperitoneal fluid  -Continue patient's home percocet for moderate pain and IV morphine for severe pain  -PRN antiemetics for nausea/vomiting  -Follow-up stool studies if she has recurrent diarrhea  -Continue IP Fortaz with PD exchanges  -ID recommendations appreciated    #ESRD on peritoneal dialysis  -Started on dialysis on 05/2018  -CT abd/pelvis shows moderate amount of ascites ? Dialysis fluid  -Under care everywhere, noted that Cr from 10/2018-11/2018 has been 3.8-5 range  -Continue PD with IP antibiotics  -Nephrology  following    #Hypertension   -Continue home amlodipine, coreg    #Hyperglycemia  #Type 2 DM  -Continue home lantus, SSI, ac/hs accuchecks  -HbA1c 9.8%    #Hyperlipidemia   -Continue home statin    #Iron deficiency anemia  -Continue IV Venofer - Day 2/3  -Continue to monitor H/H    Nutrition  Diabetic, renal diet       DVT prophylaxis   SCD     Code Status: DNR    DISPO: Pending improvement of abdominal pain    Family Contact: Larene Beach - [phone]; Called and updated daughter on patient's condition and treatment plan.     SUBJECTIVE   Patient seen and examined at bedside.  Patient reports no improvement in her abdominal pain.   Denies further nausea/vomiting or diarrhea.   MEDICATIONS     Current Facility-Administered Medications   Medication Dose Route Frequency    amLODIPine  5 mg Oral Daily    atorvastatin  40 mg Oral Daily    carvedilol  25 mg Oral Q12H SCH    darbepoetin alfa  60 mcg Subcutaneous Weekly    famotidine  10 mg Oral Q12H SCH    insulin glargine  15 Units Subcutaneous QHS    insulin lispro  1-4 Units Subcutaneous QHS    insulin lispro  1-8 Units Subcutaneous TID AC    insulin lispro  3 Units Subcutaneous TID MEALS    iron sucrose  200 mg Intravenous Q24H SCH    pantoprazole  40 mg Oral BID    sevelamer  800 mg Oral TID MEALS  PHYSICAL EXAM     Vitals:    05/15/19 0817   BP: 127/68   Pulse: 69   Resp: 18   Temp: 98.6 F (37 C)   SpO2: 98%       Temperature: Temp  Min: 97.5 F (36.4 C)  Max: 99.1 F (37.3 C)  Pulse: Pulse  Min: 65  Max: 78  Respiratory: Resp  Min: 12  Max: 18  Non-Invasive BP: BP  Min: 123/60  Max: 163/96  Pulse Oximetry SpO2  Min: 91 %  Max: 100 %    Intake and Output Summary (Last 24 hours) at Date Time    Intake/Output Summary (Last 24 hours) at 05/15/2019 1108  Last data filed at 05/15/2019 0600  Gross per 24 hour   Intake 600 ml   Output 400 ml   Net 200 ml       GEN APPEARANCE: Normal;  A&OX3  HEENT: PERLA; EOMI; Conjunctiva Clear  NECK: Supple; No  bruits  CVS: RRR, S1, S2; No M/G/R  LUNGS: CTAB; No Wheezes; No Rhonchi: No rales  ABD: Soft; diffuse abdominal tenderness with light touch; + Normoactive BS  EXT: No edema; Pulses 2+ and intact  Skin exam:  pink  NEURO: CN 2-12 intact; No Focal neurological deficits  CAP REFILL:  Normal  MENTAL STATUS:  Normal    LABS     Recent Labs   Lab 05/15/19  0551 05/14/19  1306 05/14/19  0428 05/13/19  1713 05/13/19  1132   WBC 4.48  --  5.65  --  6.62   RBC 2.48*  --  2.43*  --  2.67*   Hgb 7.2* 8.0* 7.1*  More results in Results Review 7.9*   Hematocrit 24.0* 26.5* 23.9*  More results in Results Review 26.3*   MCV 96.8*  --  98.4*  --  98.5*   Platelets 122*  --  124*  --  120*   More results in Results Review = values in this interval not displayed.       Recent Labs   Lab 05/15/19  0957 05/14/19  0428 05/13/19  1132 05/12/19  1455   Sodium 139 142 142 137   Potassium 3.6 4.0 3.5 3.5   Chloride 103 106 107 100   CO2 27 27 24 24    BUN 33.0* 40.0* 37.0* 38.0*   Creatinine 4.7* 5.1* 4.5* 4.7*   Glucose 256* 87 125* 551*   Calcium 7.4* 7.5* 7.5* 8.3*       Recent Labs   Lab 05/15/19  0957 05/14/19  0428 05/12/19  1455   ALT 11 8 12    AST (SGOT) 4* 6 7   Bilirubin, Total 0.3 0.4 0.4   Albumin 1.5* 1.6* 2.4*   Alkaline Phosphatase 91 89 114*             Microbiology Results     Procedure Component Value Units Date/Time    Blood Culture Aerobic/Anaerobic #1 [[id number]] Collected: 05/12/19 1855    Specimen: Arm from Blood, Venipuncture Updated: 05/13/19 2221    Narrative:      ORDER#: [id number]                                    ORDERED BY: Madalyn Rob  SOURCE: Blood, Venipuncture Arm                      COLLECTED:  05/12/19 18:55  ANTIBIOTICS AT COLL.:                                RECEIVED :  05/12/19 21:53  Culture Blood Aerobic and Anaerobic        PRELIM      05/13/19 22:21  05/13/19   No Growth after 1 day/s of incubation.      Blood Culture Aerobic/Anaerobic #2 [[id number]] Collected: 05/12/19 1855    Specimen: Arm from  Blood, Venipuncture Updated: 05/13/19 2221    Narrative:      ORDER#: [id number]                                    ORDERED BY: Dwyane Dee, VINOD  SOURCE: Blood, Venipuncture Arm                      COLLECTED:  05/12/19 18:55  ANTIBIOTICS AT COLL.:                                RECEIVED :  05/12/19 21:53  Culture Blood Aerobic and Anaerobic        PRELIM      05/13/19 22:21  05/13/19   No Growth after 1 day/s of incubation.      COVID-19 (SARS-COV-2) [employer] [facility]) [[id number]] Collected: 05/12/19 1611    Specimen: Nasopharyngeal Swab from Nasopharynx Updated: 05/12/19 1648     Purpose of COVID testing Screening     SARS-CoV-2 Specimen Source Nasopharyngeal     SARS CoV 2 Overall Result Negative     Comment: Test performed using the Abbott ID NOW EUA assay.  Please see Fact Sheets for patients and providers located at:  http://[facility].info/  This test is for the qualitative detection of SARS-CoV-2  (COVID19) nucleic acid. Viral nucleic acids may persist in vivo,  independent of viability. Detection of viral nucleic acid does  not imply the presence of infectious virus, or that virus  nucleic acid is the cause of clinical symptoms. Negative  results should be treated as presumptive and, if inconsistent  with clinical signs and symptoms or necessary for patient  management, should be tested with an alternative molecular  assay. Negative results do not preclude SARS-CoV-2 infection  and should not be used as the sole basis for patient  management decisions. Invalid results may be due to inhibiting  substances in the specimen and recollection should occur.         Narrative:      o Collect and clearly label specimen type:  o Upper respiratory specimen: One Nasopharyngeal Dry Swab NO  Transport Media.  o Hand deliver to laboratory ASAP  Indication for testing->Extended care facility admission to  semi private room    Culture + Gram Lenise Arena, Body Fluid [[id number]] Collected: 05/13/19 1447    Specimen:  Body Fluid from Ascites Fluid Updated: 05/13/19 2148    Narrative:      ORDER#: [id number]                                    ORDERED BY: Evonnie Dawes  SOURCE: Ascites Fluid ascites  COLLECTED:  05/13/19 14:47  ANTIBIOTICS AT COLL.:                                RECEIVED :  05/13/19 19:33  Stain, Gram                                FINAL       05/13/19 21:48  05/13/19   Many WBCs             No organisms seen  Culture and Gram Stain, Aerobic, Body FluidPENDING      Urine culture [[id number]] Collected: 05/12/19 1611    Specimen: Bladder Urine Updated: 05/14/19 0909    Narrative:      Replace urinary catheter prior to obtaining the urine culture  if it has been in place for greater than or equal to 14  days:->N/A No Foley  Indications for U/A Reflex to Micro - Reflex to  Culture:->Suprapubic Pain/Tenderness or Dysuria  ORDER#: [id number]                                    ORDERED BY: HAILE, LYDIA  SOURCE: Urine                                        COLLECTED:  05/12/19 16:11  ANTIBIOTICS AT COLL.:                                RECEIVED :  05/12/19 16:26  Culture Urine                              FINAL       05/14/19 09:09  05/14/19   >100,000 CFU/ML of multiple bacterial morphotypes present.             Possible contamination, appropriate recollection is             requested if clinically indicated.            RADIOLOGY     No results found.    Dub Amis Jouri Threat  11:08 AM 05/15/2019

## 2019-05-15 NOTE — Plan of Care (Signed)
Problem: Renal Instability  Goal: Fluid and electrolyte balance are achieved/maintained  05/15/2019 1809 by Mickel Baas, RN  Flowsheets (Taken 05/15/2019 1809)  Fluid and electrolyte balance are achieved/maintained:   Monitor/assess lab values and report abnormal values   Monitor daily weight   Assess and reassess fluid and electrolyte status   Observe for cardiac arrhythmias  05/15/2019 0855 by Mickel Baas, RN  Flowsheets (Taken 05/15/2019 [phone])  Fluid and electrolyte balance are achieved/maintained:   Monitor daily weight   Monitor/assess lab values and report abnormal values   Assess and reassess fluid and electrolyte status  Goal: Nutritional intake is adequate  05/15/2019 1809 by Mickel Baas, RN  Flowsheets (Taken 05/15/2019 1809)  Nutritional intake is adequate:   Assess anorexia, appetite, and amount of meal/food tolerated   Consult/collaborate with Clinical Nutritionist   Assist patient with meals/food selection   Monitor daily weights  05/15/2019 0855 by Mickel Baas, RN  Flowsheets (Taken 05/15/2019 [phone])  Nutritional intake is adequate:   Consult/collaborate with Clinical Nutritionist   Monitor daily weights   Assist patient with meals/food selection  Goal: Free from infection  05/15/2019 1809 by Mickel Baas, RN  Flowsheets (Taken 05/15/2019 1809)  Free from infection: Monitor/assess for signs and symptoms of infection  05/15/2019 0855 by Mickel Baas, RN  Flowsheets (Taken 05/15/2019 [phone])  Free from infection: Monitor/assess for signs and symptoms of infection     Problem: Patient Receiving Advanced Renal Therapies  Goal: Therapy access site remains intact  05/15/2019 1809 by Mickel Baas, RN  Flowsheets (Taken 05/15/2019 1809)  Therapy access site remains intact:   Assess therapy access site   Change therapy access site dressing as needed  05/15/2019 0855 by Mickel Baas, RN  Flowsheets (Taken 05/15/2019 [phone])  Therapy access site remains intact:   Assess therapy access site    Change therapy access site dressing as needed

## 2019-05-15 NOTE — Plan of Care (Signed)
Problem: Renal Instability  Goal: Fluid and electrolyte balance are achieved/maintained  Flowsheets (Taken 05/15/2019 0855)  Fluid and electrolyte balance are achieved/maintained:   Monitor daily weight   Monitor/assess lab values and report abnormal values   Assess and reassess fluid and electrolyte status  Goal: Nutritional intake is adequate  Flowsheets (Taken 05/15/2019 0855)  Nutritional intake is adequate:   Consult/collaborate with Clinical Nutritionist   Monitor daily weights   Assist patient with meals/food selection  Goal: Free from infection  Flowsheets (Taken 05/15/2019 0855)  Free from infection: Monitor/assess for signs and symptoms of infection     Problem: Patient Receiving Advanced Renal Therapies  Goal: Therapy access site remains intact  Flowsheets (Taken 05/15/2019 0855)  Therapy access site remains intact:   Assess therapy access site   Change therapy access site dressing as needed

## 2019-05-15 NOTE — Consults (Addendum)
Infectious diseases consultation    Requesting physician Eldridge Scot  Reason for request: Peritonitis  Date of admission: 4/19  Date of consultation 4/23    This is a 64 year old black female who presents with abdominal pain for 3 days.  She had a PD catheter placed in May 2020.  Peritoneal fluid is consistent with bacterial infection cultures are growing out gram-negative rods.  She has been receiving vancomycin and ceftazidime peritoneal dialysis fluid    Past medical history  1.  End-stage renal disease  2.  Hypertension  3.  Diabetes  4.  Hyperlipidemia  5.  COPD  6.  Shoulder and knee replacement.    Family history  Positive for diabetes hypertension    Social history  No ethanol or nicotine use    Medications  RenvelaNorvasc/Lipitor baby aspirin Coreg Pletal Lasix gabapentin NovoLog    Allergies  Lisinopril Metformin    Review of systems  General: Fever and chills  GI: No nausea vomiting or diarrhea  GU: On peritoneal dialysis  Respiratory: No cough chest pain shortness breath or wheezing  Cardiac: No angina palpitations  Neuro: No headache syncope or seizures  Muscle skeletal: No arthralgias or myalgias integument: No rash or pruritus  Endocrine: No signs for diabetes  Hematology: No bruising bleeding or clotting psych: No depression or anxiety    Physical examination  Vital signs: Afebrile blood pressure one 4/63 pulse 69  General: Mildly toxic in appearance  Abdomen: Diffuse tenderness in the lower quadrants.  Bowel sounds heard exerciser catheter is not erythematous there is no drainage  Lungs: Clear  Heart: Regular rhythm S1-S2 appreciated without murmur rub or gallop  Neuro: Cranial nerves intact.  No focal deficits    Imaging  CT: Fluid and ascites    Ascites fluid  WBC 51,470 96% polys  Cultures growing gram-negative rods    Labs  WBC 4.4 hemoglobin 7.2 hematocrit 24 platelet count 122,000  Differential shows 74% polys bands 0  BUN 3324.7 CO2 27 lactic acid 1.8  Bilirubin 0.3 alk phos 91 ALT 11 AST  4    Blood and urine cultures are negative    Assessment  1. PD cath related peritonitis/ GNR    Recommendations  1. Check final sensitivites  2. [place] dianeal vanco  3. Dianeal Fortaz pending sensitivites  4.  If pain persists despite treatment may need to have the catheter removed.  Thank you for consultation

## 2019-05-15 NOTE — Plan of Care (Signed)
Problem: Moderate/High Fall Risk Score >5  Goal: Patient will remain free of falls  Outcome: Progressing  Flowsheets  Taken 05/15/2019 0900 by Jory Sims, RN  High (Greater than 13):   HIGH-Consider use of low bed   HIGH-Initiate use of floor mats as appropriate   HIGH-Visual cue at entrance to patient's room  Taken 05/14/2019 0831 by Julien Girt, RN  VH High Risk (Greater than 13):   Keep door open for better visibility   Use assistive devices   Request PT/OT therapy consult order from physician for patients with gait/mobility impairment   Use of floor mat   Use of roll guard     Problem: Safety  Goal: Patient will be free from injury during hospitalization  Outcome: Progressing  Flowsheets (Taken 05/13/2019 0827 by Julien Girt, RN)  Patient will be free from injury during hospitalization:   Provide and maintain safe environment   Assess patient's risk for falls and implement fall prevention plan of care per policy   Use appropriate transfer methods   Ensure appropriate safety devices are available at the bedside   Include patient/ family/ care giver in decisions related to safety   Hourly rounding  Goal: Patient will be free from infection during hospitalization  Outcome: Progressing  Flowsheets (Taken 05/13/2019 0827 by Julien Girt, RN)  Free from Infection during hospitalization:   Assess and monitor for signs and symptoms of infection   Monitor all insertion sites (i.e. indwelling lines, tubes, urinary catheters, and drains)   Monitor lab/diagnostic results     Problem: Pain  Goal: Pain at adequate level as identified by patient  Outcome: Progressing  Flowsheets (Taken 05/15/2019 0319 by Candice Camp, RN)  Pain at adequate level as identified by patient:   Assess for risk of opioid induced respiratory depression, including snoring/sleep apnea. Alert healthcare team of risk factors identified.   Identify patient comfort function goal   Reassess pain within 30-60 minutes of any  procedure/intervention, per Pain Assessment, Intervention, Reassessment (AIR) Cycle   Evaluate if patient comfort function goal is met   Assess pain on admission, during daily assessment and/or before any "as needed" intervention(s)   Evaluate patient's satisfaction with pain management progress   Offer non-pharmacological pain management interventions   Consult/collaborate with Pain Service     Problem: Side Effects from Pain Analgesia  Goal: Patient will experience minimal side effects of analgesic therapy  Outcome: Progressing  Flowsheets (Taken 05/13/2019 1949 by Brandon Melnick, Mimo R, RN)  Patient will experience minimal side effects of analgesic therapy: Monitor/assess patient's respiratory status (RR depth, effort, breath sounds)     Problem: Discharge Barriers  Goal: Patient will be discharged home or other facility with appropriate resources  Outcome: Progressing     Problem: Psychosocial and Spiritual Needs  Goal: Demonstrates ability to cope with hospitalization/illness  Outcome: Progressing  Flowsheets (Taken 05/15/2019 2050)  Demonstrates ability to cope with hospitalizations/illness:   Provide quiet environment   Encourage participation in diversional activity   Encourage verbalization of feelings/concerns/expectations     Problem: Compromised Tissue integrity  Goal: Damaged tissue is healing and protected  Outcome: Progressing  Flowsheets (Taken 05/15/2019 2050)  Damaged tissue is healing and protected:   Monitor/assess Braden scale every shift   Reposition patient every 2 hours and as needed unless able to reposition self   Increase activity as tolerated/progressive mobility   Keep intact skin clean and dry  Goal: Nutritional status is improving  Outcome: Progressing  Flowsheets (Taken 05/15/2019 0319 by  Candice Camp, RN)  Nutritional status is improving:   Encourage patient to take dietary supplement(s) as ordered   Assist patient with eating   Include patient/patient care companion in decisions related to  nutrition   Collaborate with Clinical Nutritionist   Allow adequate time for meals     Problem: Renal Instability  Goal: Fluid and electrolyte balance are achieved/maintained  Outcome: Progressing  Flowsheets (Taken 05/15/2019 1809 by Mickel Baas, RN)  Fluid and electrolyte balance are achieved/maintained:   Monitor/assess lab values and report abnormal values   Monitor daily weight   Assess and reassess fluid and electrolyte status   Observe for cardiac arrhythmias  Goal: Nutritional intake is adequate  Outcome: Progressing  Flowsheets (Taken 05/15/2019 1809 by Mickel Baas, RN)  Nutritional intake is adequate:   Assess anorexia, appetite, and amount of meal/food tolerated   Consult/collaborate with Clinical Nutritionist   Assist patient with meals/food selection   Monitor daily weights  Goal: Perineal skin integrity is maintained or improved  Outcome: Progressing  Flowsheets (Taken 05/15/2019 2050)  Perineal skin integrity is maintained or improved: Keep intact skin clean and dry  Goal: Free from infection  Outcome: Progressing  Flowsheets (Taken 05/15/2019 1809 by Mickel Baas, RN)  Free from infection: Monitor/assess for signs and symptoms of infection     Problem: Altered GI Function  Goal: Nutritional intake is adequate  Outcome: Progressing  Flowsheets (Taken 05/15/2019 1809 by Mickel Baas, RN)  Nutritional intake is adequate:   Assess anorexia, appetite, and amount of meal/food tolerated   Consult/collaborate with Clinical Nutritionist   Assist patient with meals/food selection   Monitor daily weights  Goal: Fluid and electrolyte balance are achieved/maintained  Outcome: Progressing  Flowsheets (Taken 05/15/2019 0319 by Candice Camp, RN)  Fluid and electrolyte balance are achieved/maintained:   Monitor intake and output every shift   Monitor/assess lab values and report abnormal values   Assess for confusion/personality changes   Provide adequate hydration   Assess and reassess fluid and  electrolyte status   Observe for seizure activity and initiate seizure precautions if indicated   Monitor for muscle weakness   Observe for cardiac arrhythmias   Monitor daily weight  Goal: Elimination patterns are normal or improving  Outcome: Progressing  Flowsheets (Taken 05/15/2019 2050)  Elimination patterns are normal or improving:   Report abnormal assessment to physician   Anticipate/assist with toileting needs   Assess for normal bowel sounds   Monitor for abdominal distension   Monitor for abdominal discomfort  Goal: Mobility/Activity is maintained at optimal level for patient  Outcome: Progressing  Flowsheets (Taken 05/13/2019 1949 by Brandon Melnick, Mimo R, RN)  Mobility/activity is maintained at optimal level for patient:   Increase mobility as tolerated/progressive mobility   Encourage independent activity per ability  Goal: No bleeding  Outcome: Progressing  Flowsheets (Taken 05/15/2019 2050)  No bleeding: Monitor and assess vitals and hemodynamic parameters     Problem: Patient Receiving Advanced Renal Therapies  Goal: Therapy access site remains intact  Outcome: Progressing  Flowsheets (Taken 05/15/2019 1809 by Mickel Baas, RN)  Therapy access site remains intact:   Assess therapy access site   Change therapy access site dressing as needed     Pt is A&O x4 and she has moments of confusion. For example she said she saw bugs running across the top of the curtain and no one else saw the bugs. BS monitored and covered. SCDs worn. Morphine and Oxy  and heat  packs given for moderate-severe abdominal pain. Moisturizer given for dry lips.

## 2019-05-15 NOTE — Progress Notes (Signed)
[facility] PROGRESS NOTE  Aaron Edelman, x [phone] [facility] Spectralink)      Date Time: 05/15/19 6:52 AM  Patient Name: [name]  Attending Physician: Colin Broach, MD    CC: follow-up CKD, PD    Assessment:     1. PD associated peritonitis (wbc 51,000)  2. ESRD- on CAPD  3. Anemia- CKD + Fe deficiency now exacerbated by GI bleed  4. Volume overload  5. Hypocalcemia-low albumin  6. ?UTI  7. Type 2 DM- poorly controlled  8. Morbid obesity    Recommendations:       Follow up on PD fluid cultures  Continue current PD exchanges with IP antibiotics      Case discussed with:  Pt, PD RN    Armando Reichert, MD  [facility]  [phone] (office)  X [phone] (FFX Spectra-Link)      Subjective: Sleeping. Still with abd pain per RN    Review of Systems:   Review of Systems - No SOB, CP + abd pain    Physical Exam:     Vitals:    05/14/19 2105 05/14/19 2252 05/15/19 0037 05/15/19 0555   BP: (!) 163/96 128/63 136/71 149/73   Pulse: 78 74 65 73   Resp:   12 18   Temp: 98.2 F (36.8 C)  99.1 F (37.3 C) 97.9 F (36.6 C)   TempSrc: Oral  Oral Oral   SpO2: 94% 91% 99% 98%   Weight:    117.9 kg (259 lb 14.8 oz)   Height:           Intake and Output Summary (Last 24 hours) at Date Time    Intake/Output Summary (Last 24 hours) at 05/15/2019 3845  Last data filed at 05/15/2019 0555  Gross per 24 hour   Intake 870 ml   Output 500 ml   Net 370 ml       General: obese female, awake, alert, oriented x 3, no acute distress.  Cardiovascular: regular rate and rhythm, no murmurs, rubs or gallops  Lungs: clear to auscultation bilaterally, without wheezing, rhonchi, or rales  Abdomen: soft, slightly tender diffusely b, non-distended, normoactive bowel sounds; LLQ PD catheter   Extremities: no clubbing, cyanosis, trace LE  edema      Meds:      Scheduled Meds: PRN Meds:    amLODIPine, 5 mg, Oral, Daily  atorvastatin, 40 mg, Oral, Daily  carvedilol, 25 mg, Oral, Q12H SCH  darbepoetin alfa, 60 mcg,  Subcutaneous, Weekly  famotidine, 10 mg, Oral, Q12H SCH  insulin glargine, 15 Units, Subcutaneous, QHS  insulin lispro, 1-4 Units, Subcutaneous, QHS  insulin lispro, 1-8 Units, Subcutaneous, TID AC  insulin lispro, 3 Units, Subcutaneous, TID MEALS  iron sucrose, 200 mg, Intravenous, Q24H SCH  pantoprazole, 40 mg, Intravenous, BID  sevelamer, 800 mg, Oral, TID MEALS          Continuous Infusions:   sodium chloride 100 mL/hr at 05/14/19 1828    lactated ringers 20 mL/hr at 05/14/19 2108    dextrose, 15 g of glucose, PRN   And  dextrose, 12.5 g, PRN   And  glucagon (rDNA), 1 mg, PRN  morphine, 2 mg, Q4H PRN  naloxone, 0.2 mg, PRN  ondansetron, 4 mg, Q6H PRN   Or  ondansetron, 4 mg, Q6H PRN  oxyCODONE-acetaminophen, 1 tablet, Q4H PRN              Labs:     Recent Labs   Lab  05/15/19  0551 05/14/19  1306 05/14/19  0428 05/13/19  1713 05/13/19  1132   WBC 4.48  --  5.65  --  6.62   Hgb 7.2* 8.0* 7.1*  More results in Results Review 7.9*   Hematocrit 24.0* 26.5* 23.9*  More results in Results Review 26.3*   Platelets 122*  --  124*  --  120*   More results in Results Review = values in this interval not displayed.     Recent Labs   Lab 05/14/19  0428 05/13/19  1132 05/12/19  1455   Sodium 142 142 137   Potassium 4.0 3.5 3.5   Chloride 106 107 100   CO2 27 24 24    BUN 40.0* 37.0* 38.0*   Creatinine 5.1* 4.5* 4.7*   Calcium 7.5* 7.5* 8.3*   Albumin 1.6*  --  2.4*   Phosphorus 4.2 3.5  --    Glucose 87 125* 551*   EGFR 10.3 11.9 11.3       Recent Labs   Lab 05/12/19  1611   Urine Type Urine, Catheriz   Color, UA Yellow   Clarity, UA Sl Cloudy*   Specific Gravity UA 1.008   Urine pH 9.0*   Nitrite, UA Negative   Ketones UA Negative   Urobilinogen, UA 4.0*   Bilirubin, UA Negative   Blood, UA Large*   RBC, UA 26 - 50*   WBC, UA 26 - 50*           Imaging personally reviewed, including: No results found.        Signed by: Armando Reichert, MD

## 2019-05-15 NOTE — Plan of Care (Signed)
Problem: Pain  Goal: Pain at adequate level as identified by patient  Flowsheets (Taken 05/15/2019 0319)  Pain at adequate level as identified by patient:   Assess for risk of opioid induced respiratory depression, including snoring/sleep apnea. Alert healthcare team of risk factors identified.   Identify patient comfort function goal   Reassess pain within 30-60 minutes of any procedure/intervention, per Pain Assessment, Intervention, Reassessment (AIR) Cycle   Evaluate if patient comfort function goal is met   Assess pain on admission, during daily assessment and/or before any "as needed" intervention(s)   Evaluate patient's satisfaction with pain management progress   Offer non-pharmacological pain management interventions   Consult/collaborate with Pain Service     Problem: Compromised Tissue integrity  Goal: Nutritional status is improving  Flowsheets (Taken 05/15/2019 0319)  Nutritional status is improving:   Encourage patient to take dietary supplement(s) as ordered   Assist patient with eating   Include patient/patient care companion in decisions related to nutrition   Collaborate with Clinical Nutritionist   Allow adequate time for meals     Problem: Altered GI Function  Goal: Fluid and electrolyte balance are achieved/maintained  Flowsheets (Taken 05/15/2019 0319)  Fluid and electrolyte balance are achieved/maintained:   Monitor intake and output every shift   Monitor/assess lab values and report abnormal values   Assess for confusion/personality changes   Provide adequate hydration   Assess and reassess fluid and electrolyte status   Observe for seizure activity and initiate seizure precautions if indicated   Monitor for muscle weakness   Observe for cardiac arrhythmias   Monitor daily weight     Problem: Compromised Tissue integrity  Goal: Nutritional status is improving  Flowsheets (Taken 05/15/2019 0319)  Nutritional status is improving:   Encourage patient to take dietary supplement(s) as ordered    Assist patient with eating   Include patient/patient care companion in decisions related to nutrition   Collaborate with Clinical Nutritionist   Allow adequate time for meals   Pt alert and oriented x 4 but easily to forgetful  things. Denise pain, take all of her medications and tolerated, eats and drinks well.B/S checked and coverage given, pt on dialysis, safety bundle in place, will continue to follow the plan.

## 2019-05-16 LAB — COMPREHENSIVE METABOLIC PANEL
ALT: 6 U/L (ref 0–55)
AST (SGOT): 5 U/L (ref 5–34)
Albumin/Globulin Ratio: 0.5 — ABNORMAL LOW (ref 0.9–2.2)
Albumin: 1.5 g/dL — ABNORMAL LOW (ref 3.5–5.0)
Alkaline Phosphatase: 95 U/L (ref 37–106)
Anion Gap: 10 (ref 5.0–15.0)
BUN: 30 mg/dL — ABNORMAL HIGH (ref 7.0–19.0)
Bilirubin, Total: 0.2 mg/dL (ref 0.2–1.2)
CO2: 28 mEq/L (ref 22–29)
Calcium: 7.2 mg/dL — ABNORMAL LOW (ref 8.5–10.5)
Chloride: 100 mEq/L (ref 100–111)
Creatinine: 4.5 mg/dL — ABNORMAL HIGH (ref 0.6–1.0)
Globulin: 3.1 g/dL (ref 2.0–3.6)
Glucose: 409 mg/dL — ABNORMAL HIGH (ref 70–100)
Potassium: 3.1 mEq/L — ABNORMAL LOW (ref 3.5–5.1)
Protein, Total: 4.6 g/dL — ABNORMAL LOW (ref 6.0–8.3)
Sodium: 138 mEq/L (ref 136–145)

## 2019-05-16 LAB — CBC WITH MANUAL DIFFERENTIAL
Absolute NRBC: 0.05 10*3/uL — ABNORMAL HIGH (ref 0.00–0.00)
Band Neutrophils Absolute: 0 10*3/uL (ref 0.00–1.00)
Band Neutrophils: 0 %
Basophils Absolute Manual: 0 10*3/uL (ref 0.00–0.08)
Basophils Manual: 0 %
Cell Morphology: ABNORMAL — AB
Eosinophils Absolute Manual: 0.07 10*3/uL (ref 0.00–0.44)
Eosinophils Manual: 1 %
Hematocrit: 23.3 % — ABNORMAL LOW (ref 34.7–43.7)
Hgb: 6.9 g/dL — ABNORMAL LOW (ref 11.4–14.8)
Lymphocytes Absolute Manual: 0.72 10*3/uL (ref 0.42–3.22)
Lymphocytes Manual: 11 %
MCH: 29.1 pg (ref 25.1–33.5)
MCHC: 29.6 g/dL — ABNORMAL LOW (ref 31.5–35.8)
MCV: 98.3 fL — ABNORMAL HIGH (ref 78.0–96.0)
MPV: 12.8 fL — ABNORMAL HIGH (ref 8.9–12.5)
Monocytes Absolute: 0.65 10*3/uL (ref 0.21–0.85)
Monocytes Manual: 10 %
Neutrophils Absolute Manual: 5.09 10*3/uL (ref 1.10–6.33)
Nucleated RBC: 0.8 /100 WBC — ABNORMAL HIGH (ref 0.0–0.0)
Platelet Estimate: DECREASED — AB
Platelets: 134 10*3/uL — ABNORMAL LOW (ref 142–346)
RBC: 2.37 10*6/uL — ABNORMAL LOW (ref 3.90–5.10)
RDW: 13 % (ref 11–15)
Segmented Neutrophils: 78 %
WBC: 6.53 10*3/uL (ref 3.10–9.50)

## 2019-05-16 LAB — CROSSMATCH PRBC, 1 UNIT
Expiration Date: 202105172359
ISBT CODE: 8400
Status: TRANSFUSED
UTYPE: AB POS

## 2019-05-16 LAB — TYPE AND SCREEN
AB Screen Gel: NEGATIVE
ABO Rh: AB POS

## 2019-05-16 LAB — GLUCOSE WHOLE BLOOD - POCT
Whole Blood Glucose POCT: 265 mg/dL — ABNORMAL HIGH (ref 70–100)
Whole Blood Glucose POCT: 142 mg/dL — ABNORMAL HIGH (ref 70–100)
Whole Blood Glucose POCT: 109 mg/dL — ABNORMAL HIGH (ref 70–100)
Whole Blood Glucose POCT: 216 mg/dL — ABNORMAL HIGH (ref 70–100)

## 2019-05-16 LAB — HEMOLYSIS INDEX
Hemolysis Index: 1 (ref 0–18)
Hemolysis Index: 1 (ref 0–18)

## 2019-05-16 LAB — GFR: EGFR: 11.9

## 2019-05-16 LAB — MAGNESIUM: Magnesium: 1.4 mg/dL — ABNORMAL LOW (ref 1.6–2.6)

## 2019-05-16 MED ORDER — MAGNESIUM SULFATE IN D5W 1-5 GM/100ML-% IV SOLN
1.0000 g | INTRAVENOUS | Status: AC
Start: 2019-05-16 — End: 2019-05-16
  Administered 2019-05-16 (×2): 1 g via INTRAVENOUS
  Filled 2019-05-16 (×2): qty 100

## 2019-05-16 MED ORDER — SODIUM CHLORIDE 0.9 % IV SOLN
INTRAVENOUS | Status: DC | PRN
Start: 2019-05-16 — End: 2019-05-26

## 2019-05-16 MED ORDER — MAGNESIUM SULFATE IN D5W 1-5 GM/100ML-% IV SOLN
1.00 g | INTRAVENOUS | Status: DC
Start: 2019-05-16 — End: 2019-05-16

## 2019-05-16 MED ORDER — LACTULOSE 10 GM/15ML PO SOLN
30.00 g | Freq: Three times a day (TID) | ORAL | Status: DC
Start: 2019-05-16 — End: 2019-05-26
  Administered 2019-05-16 – 2019-05-25 (×6): 30 g via ORAL
  Filled 2019-05-16 (×12): qty 60

## 2019-05-16 MED ORDER — POTASSIUM CHLORIDE CRYS ER 10 MEQ PO TBCR
40.00 meq | EXTENDED_RELEASE_TABLET | Freq: Once | ORAL | Status: AC
Start: 2019-05-16 — End: 2019-05-16
  Administered 2019-05-16: 10:00:00 40 meq via ORAL
  Filled 2019-05-16: qty 4

## 2019-05-16 MED ORDER — DIANEAL LOW CALCIUM/2.5% DEX 395 MOSM/L IP SOLN
INTRAPERITONEAL | Status: DC
Start: 2019-05-16 — End: 2019-05-17
  Filled 2019-05-16: qty 5000

## 2019-05-16 NOTE — Progress Notes (Signed)
SoftID Label Verification    The below components have been visualized on the label attached to the specimen and verified as accurate:    U# Yes    Date: Yes    Time: YES    BRID: YES    Note: If label alignment results in any of the above items being cut off, reprint the label or manually write it in.        IAH UNIT 23 BRID-BAND DOCUMENTATION   U# [place] # [id number]   SPECIMEN DRAWN BY (Full Name) Vicente Serene         Verifier   U# [place] # [id number]   VERIFIED BY RNUS/Charge (Full Name) Loleta Rose       This verifies that this blood bank specimen collection was performed using two person verification.

## 2019-05-16 NOTE — Progress Notes (Signed)
Case Management Initial Assessment and Discharge Planning:    Situation AKI     Background Past Medical History:   Diagnosis Date    Chronic obstructive pulmonary disease     Diabetes mellitus     Hyperlipidemia     Hypertension     Sleep apnea 2018         Assessment Pt is alert and oriented x3, is independent with all ADL'S, and uses a Cane for additional supports at time.  Pt reports living alone and has limited supports from her adult children.  Pt is a Peritoneal Dialysis Candidate and may benefit from a Nutrition Consult.  Pt will likely D/C home to self care.     Recommendation D/C home                 05/16/19 1603   Healthcare Decisions   Interviewed: Patient   Orientation/Decision Making Abilities of Patient Alert and Oriented x3, able to make decisions   Advance Directive Patient has advance directive, copy in chart   Prior to admission   Prior level of function Independent with ADLs;Ambulates with assistive device   Type of Residence Private residence   Home Layout One level   Have running water, electricity, heat, etc? Yes   Living Arrangements Alone   How do you get to your MD appointments? self   How do you get your groceries? self   Who fixes your meals? self   Who does your laundry? self   Who picks up your prescriptions? self   Dressing Independent   Grooming Independent   Feeding Independent   Bathing Independent   Toileting Independent   Discharge [place] Family members   Anticipated [place] plan discussed with: Same as interviewed   Mode of transportation: Private car (family member)   Does the patient have perscription coverage? Yes   Consults/Providers   PT Evaluation Needed 2   OT Evalulation Needed 2   SLP Evaluation Needed 2   Correct PCP listed in Epic? No (comment)  (does not have a PCP)   Important Message from Medicare Notice   Patient received 1st IMM Letter? Yes

## 2019-05-16 NOTE — UM Notes (Signed)
05/16/19 Concurrent Clinical Inpatient Review    05/16/19 Medicine Assessment/Plan    #UGIB - Patient had 1 episode of 400 cc coffee ground emesis on 4/20 per RN  #Possible melena per patient report  -s/p EGD on 4/21: Grade D gastritis  -Continue Protonix 40 mg BID  -Hold ASA and Cilostazol  -Continue to monitor H/H  -GI recommendations appreciated. Outpatient follow-up for repeat EGD.    #GNR Peritonitis, pansensitive   #Abdominal pain  #Nausea, vomiting, diarrhea - Resolved  #UTI  -Likelydue toviral gastroenteritis/UTI/Peritonitis  -Peritoneal fluid with 51,000 WBC's. Follow-up fluid culture results  -Blood cultures NGTD, lactic acid normal and lipase normal  -Urine culture: >100,000 CFU/ML of multiple bacterial morphotypes   -CT abd/pelvis wo contrastrevealsno evidence of bowel obstruction, bowel ischemia, perforation, diverticulitis, pancreatitis, nephrolithiasis and shows intraperitoneal fluid  -Continue patient's homepercocetfor moderate pain and IV morphine for severe pain  -PRN antiemetics for nausea/vomiting  -Continue IP Fortaz with PD exchanges  -ID recommendations appreciated    #Hypokalemia  #Hypomagnesemia  -K 3.1, potassium chloride 44meq once  -Mg 1.4, magnesium sulfate 1g every 1 hour x2    #Abdominal pain  -Likely due to above  -Pt also reports constipation x1 week. Will start BR     #ESRD on peritoneal dialysis  -Started on dialysis on 05/2018  -CT abd/pelvis shows moderate amount of ascites ? Dialysis fluid  -Under care everywhere, noted that Cr from 10/2018-11/2018 has been 3.8-5 range  -Continue PD with IP antibiotics  -Nephrology following    #Hypertension  -Continue home amlodipine, coreg    #Hyperglycemia  #Type 2 DM  -Continue home lantus, SSI, ac/hs accuchecks  -HbA1c 9.8%    #Hyperlipidemia   -Continue home statin    #Iron deficiency anemia  -Continue IV Venofer - Day 2/3  -Continue to monitor H/H  -Hg 6.9, will order 1 unit pRBC transfusion     Nutrition  Diabetic, renal  diet      DVT prophylaxis  SCD    Code Status: DNR    DISPO: Pending improvement of abdominal pain    Temp:  [98.4 F (36.9 C)-99.1 F (37.3 C)]   Heart Rate:  [67-72]   Resp Rate:  [14-20]   BP: (93-139)/(54-80)   SpO2:  [92 %-97 %]     Pain Score: 6-moderate pain     05/15/19 Nephro Assessment:  1. PD associated peritonitis (wbc 51,000)  2. ESRD- on CAPD  3. Anemia- CKD + Fe deficiency now exacerbated by GI bleed  4. Volume overload  5. Hypocalcemia-low albumin  6. ?UTI  7. Type 2 DM- poorly controlled  8. Morbid obesity    Recommendations:    PD fluid cx growing E.Coli sensitive to ceftazidime  Continue current PD exchanges with IP antibiotics        05/16/2019 03:54  Hemoglobin: 6.9   Hematocrit: 23.3   Platelet Count: 134   RBC: 2.37     05/16/2019 03:54  Glucose: 409   BUN: 30.0   Creatinine: 4.5       05/16/2019 03:54  Potassium: 3.1     05/16/2019 03:54  Albumin: 1.5   Protein Total: 4.6       Stain, Gram                     Culture and Gram Stain, Aerobic, Body Fluid         05/15/19  Very light growth of Escherichia coli     Scheduled Meds:  Current  Facility-Administered Medications   Medication Dose Route Frequency    amLODIPine  5 mg Oral Daily    atorvastatin  40 mg Oral Daily    carvedilol  25 mg Oral Q12H SCH    darbepoetin alfa  60 mcg Subcutaneous Weekly    famotidine  10 mg Oral Q12H [place]    insulin glargine  15 Units Subcutaneous QHS    insulin lispro  1-4 Units Subcutaneous QHS    insulin lispro  1-8 Units Subcutaneous TID AC    insulin lispro  3 Units Subcutaneous TID MEALS    iron sucrose  200 mg Intravenous Q24H SCH    pantoprazole  40 mg Oral BID    potassium chloride  40 mEq Oral Once    sevelamer  800 mg Oral TID MEALS     Continuous Infusions:   sodium chloride 100 mL/hr at 05/14/19 1828    peritoneal dialysis with additives 0 mL/hr at 05/15/19 1817    lactated ringers Stopped (05/15/19 1409)     morphine injection 2 mg   Dose: 2 mg   Every 4  hours PRN Route: IV x 3 doses 4/22 0857, 1319, 1804 and 4/23 x 1 dose at 0343    oxyCODONE-acetaminophen (PERCOCET) 5-325 MG per tablet 1 tablet   Dose: 1 tablet  Freq: Every 4 hours PRN Route: Pox 2 doses 4/22 at 1521 and 2042

## 2019-05-16 NOTE — Plan of Care (Signed)
Problem: Renal Instability  Goal: Fluid and electrolyte balance are achieved/maintained  Outcome: Progressing  Flowsheets (Taken 05/16/2019 1901)  Fluid and electrolyte balance are achieved/maintained: Monitor/assess lab values and report abnormal values  Goal: Free from infection  Outcome: Progressing  Flowsheets (Taken 05/16/2019 1901)  Free from infection: Monitor/assess for signs and symptoms of infection     Problem: Patient Receiving Advanced Renal Therapies  Goal: Therapy access site remains intact  Outcome: Progressing  Flowsheets (Taken 05/16/2019 1901)  Therapy access site remains intact:   Assess therapy access site   Change therapy access site dressing as needed

## 2019-05-16 NOTE — Progress Notes (Signed)
Nutritional Support Services  Nutrition Follow-up    CORNELIUS SCHUITEMA 64 y.o. female   MRN: [id number]    Summary of Nutrition Recommendations:  1) Continue current diet. Add Consistent CHO restrictions given persistently elevated GLuc    2) Continue Nepro po BID with meals. Each Nepro shake provides 425 kcal, 19 gm protein, 250 mg potassium and 170 mg phosphorus.    3) Daily weights    4) Continue to monitor BG levels- adjust insulin regimen as needed    -----------------------------------------------------------------------------------------------------------------                                                       ASSESSMENT DATA     Subjective Nutrition: Pt seen as follow up. Interview brief as pt only nodding/shaking head in response to questions. Indicates that appetite remains decreased though po intake appears to be improving. No N/V but abdominal pain persists. Drinking ONS- will continue on trays. No additional issues reported at this time.       Medical Hx:  has a past medical history of Chronic obstructive pulmonary disease, Diabetes mellitus, Hyperlipidemia, Hypertension, and Sleep apnea (2018).       Orders Placed This Encounter   Procedures   . Diet renal Protein restriction: 80 GM Protein   . Nepro Shake Quantity: A. One; Flavor: Data processing manager; Frequency: BID (2 times a day) - with Breakfast and Dinner     Intake:    4/22: 75%, 100%; Nepro (100% x 1)  4/21: 50%      ANTHROPOMETRIC  Height: 157.5 cm (5' 2.01")  Weight: 120.3 kg (265 lb 3.4 oz)  Weight Change: 2.04  IBW/kg (Calculated) Female: 50.02 kg  Body mass index is 48.5 kg/m.       ESTIMATED NEEDS    Total Daily Energy Needs: 2016 to 2576 kcal  Method for Calculating Energy Needs: 18 kcal - 23 kcal per kg  at 112 kg (Actual body weight)  Rationale: BMI > 30, non-ICU, ESRD on PD       Total Daily Protein Needs: 69.8544 to 84.8232 g  Method for Calculating Protein Needs: 1.4 g - 1.7 g per kg at 49.896 kg (Ideal body weight)  Rationale: BMI >  30, non-ICU, ESRD on PD      Total Daily Fluid Needs: 1097.712 to 1247.4 ml  Method for Calculating Fluid Needs: 22 ml - 25 ml  per kg at 49.896 kg (Ideal body weight)  Rationale: BMI >30, ESRD on PD                                                             INTERVENTION     Nutrition recommendation - Please refer to top of note                                                       MONITORING/EVALUATION     Goals:     1) Pt will consume > 75%  of meals and supplements by RD next assessment (new)       Nutrition Risk Level: Moderate (will follow up at least 1 time per week and PRN)       Edsel Petrin MS, RDN, [place] Dietitian  Spectralink-[phone]

## 2019-05-16 NOTE — Plan of Care (Signed)
Problem: Moderate/High Fall Risk Score >5  Goal: Patient will remain free of falls  Flowsheets (Taken 05/16/2019 1905 by Gaynelle Cage, RN)  Moderate Risk (6-13):   MOD-Consider activation of bed alarm if appropriate   MOD-Perform dangle, stand, walk (DSW) when getting patient up   MOD-Use gait belt when appropriate     Problem: Safety  Goal: Patient will be free from injury during hospitalization  Flowsheets (Taken 05/13/2019 0827 by Julien Girt, RN)  Patient will be free from injury during hospitalization:   Provide and maintain safe environment   Assess patient's risk for falls and implement fall prevention plan of care per policy   Use appropriate transfer methods   Ensure appropriate safety devices are available at the bedside   Include patient/ family/ care giver in decisions related to safety   Hourly rounding  Goal: Patient will be free from infection during hospitalization  Flowsheets (Taken 05/13/2019 0827 by Julien Girt, RN)  Free from Infection during hospitalization:   Assess and monitor for signs and symptoms of infection   Monitor all insertion sites (i.e. indwelling lines, tubes, urinary catheters, and drains)   Monitor lab/diagnostic results     Problem: Pain  Goal: Pain at adequate level as identified by patient  Flowsheets (Taken 05/15/2019 0319 by Candice Camp, RN)  Pain at adequate level as identified by patient:   Assess for risk of opioid induced respiratory depression, including snoring/sleep apnea. Alert healthcare team of risk factors identified.   Identify patient comfort function goal   Reassess pain within 30-60 minutes of any procedure/intervention, per Pain Assessment, Intervention, Reassessment (AIR) Cycle   Evaluate if patient comfort function goal is met   Assess pain on admission, during daily assessment and/or before any "as needed" intervention(s)   Evaluate patient's satisfaction with pain management progress   Offer non-pharmacological pain management  interventions   Consult/collaborate with Pain Service     Problem: Side Effects from Pain Analgesia  Goal: Patient will experience minimal side effects of analgesic therapy  Flowsheets (Taken 05/13/2019 1949)  Patient will experience minimal side effects of analgesic therapy: Monitor/assess patient's respiratory status (RR depth, effort, breath sounds)     Problem: Discharge Barriers  Goal: Patient will be discharged home or other facility with appropriate resources  Flowsheets (Taken 05/16/2019 0249 by Haroldine Laws, RN)  Discharge to home or other facility with appropriate resources:   Provide appropriate patient education   Provide information on available health resources   Initiate discharge planning     Problem: Psychosocial and Spiritual Needs  Goal: Demonstrates ability to cope with hospitalization/illness  Flowsheets (Taken 05/15/2019 2050 by Jory Sims, RN)  Demonstrates ability to cope with hospitalizations/illness:   Provide quiet environment   Encourage participation in diversional activity   Encourage verbalization of feelings/concerns/expectations     Problem: Compromised Tissue integrity  Goal: Damaged tissue is healing and protected  Flowsheets (Taken 05/15/2019 2050 by Jory Sims, RN)  Damaged tissue is healing and protected:   Monitor/assess Braden scale every shift   Reposition patient every 2 hours and as needed unless able to reposition self   Increase activity as tolerated/progressive mobility   Keep intact skin clean and dry  Goal: Nutritional status is improving  Flowsheets (Taken 05/15/2019 0319 by Candice Camp, RN)  Nutritional status is improving:   Encourage patient to take dietary supplement(s) as ordered   Assist patient with eating   Include patient/patient care companion in decisions related to nutrition   Collaborate with Clinical  Nutritionist   Allow adequate time for meals     Problem: Renal Instability  Goal: Fluid and electrolyte balance are achieved/maintained  Flowsheets (Taken  05/16/2019 1901 by Lendell Caprice, RN)  Fluid and electrolyte balance are achieved/maintained: Monitor/assess lab values and report abnormal values  Goal: Nutritional intake is adequate  Flowsheets (Taken 05/16/2019 1905 by Gaynelle Cage, RN)  Nutritional intake is adequate:   Assist patient with meals/food selection   Allow adequate time for meals  Goal: Perineal skin integrity is maintained or improved  Flowsheets (Taken 05/15/2019 2050 by Jory Sims, RN)  Perineal skin integrity is maintained or improved: Keep intact skin clean and dry  Goal: Free from infection  Flowsheets (Taken 05/16/2019 1901 by Lendell Caprice, RN)  Free from infection: Monitor/assess for signs and symptoms of infection     Problem: Patient Receiving Advanced Renal Therapies  Goal: Therapy access site remains intact  Flowsheets (Taken 05/16/2019 1901 by Lendell Caprice, RN)  Therapy access site remains intact:   Assess therapy access site   Change therapy access site dressing as needed     Problem: Altered GI Function  Goal: Nutritional intake is adequate  Flowsheets (Taken 05/16/2019 1905 by Gaynelle Cage, RN)  Nutritional intake is adequate:   Assist patient with meals/food selection   Allow adequate time for meals  Goal: Fluid and electrolyte balance are achieved/maintained  Flowsheets (Taken 05/15/2019 0319 by Candice Camp, RN)  Fluid and electrolyte balance are achieved/maintained:   Monitor intake and output every shift   Monitor/assess lab values and report abnormal values   Assess for confusion/personality changes   Provide adequate hydration   Assess and reassess fluid and electrolyte status   Observe for seizure activity and initiate seizure precautions if indicated   Monitor for muscle weakness   Observe for cardiac arrhythmias   Monitor daily weight  Goal: Elimination patterns are normal or improving  Flowsheets (Taken 05/15/2019 2050 by Jory Sims, RN)  Elimination patterns are normal or improving:   Report abnormal assessment to physician    Anticipate/assist with toileting needs   Assess for normal bowel sounds   Monitor for abdominal distension   Monitor for abdominal discomfort  Goal: Mobility/Activity is maintained at optimal level for patient  Flowsheets (Taken 05/13/2019 1949)  Mobility/activity is maintained at optimal level for patient:   Increase mobility as tolerated/progressive mobility   Encourage independent activity per ability  Goal: No bleeding  Flowsheets (Taken 05/15/2019 2050 by Jory Sims, RN)  No bleeding: Monitor and assess vitals and hemodynamic parameters

## 2019-05-16 NOTE — Progress Notes (Signed)
[facility] PROGRESS NOTE  Aaron Edelman, x [phone] [facility] Spectralink)      Date Time: 05/16/19 11:27 AM  Patient Name: [name]  Attending Physician: Ardeth Sportsman, MD    CC: follow-up CKD, PD    Assessment:     1. PD associated peritonitis (wbc 51,000)  2. ESRD- on CAPD  3. Anemia- CKD + Fe deficiency now exacerbated by GI bleed  4. Volume overload  5. Hypocalcemia-low albumin  6. ?UTI  7. Type 2 DM- poorly controlled  8. Morbid obesity    Recommendations:     PD fluid cx growing E.Coli sensitive to ceftazidime  Continue current PD exchanges with IP antibiotics      Case discussed with:  Pt, PD RN    Verl Blalock, MD   [facility]  [phone] (office)  X [phone] (FFX Spectra-Link)      Subjective:   Awake, alert  Continues to have RLQ pain, somewhat improved on the LLQ    Review of Systems:   Cardio: no chest pain, no edema  Pulm: no SOB  Abd: + tenderness in the RLQ      Physical Exam:     Vitals:    05/16/19 0400 05/16/19 0758 05/16/19 1001 05/16/19 1126   BP:  139/80 165/72    Pulse:  67 66 66   Resp:  16  18   Temp:  98.4 F (36.9 C)  98.2 F (36.8 C)   TempSrc:  Oral  Oral   SpO2:  96%  95%   Weight: 120.3 kg (265 lb 3.4 oz)      Height:           Intake and Output Summary (Last 24 hours) at Date Time    Intake/Output Summary (Last 24 hours) at 05/16/2019 1127  Last data filed at 05/15/2019 2300  Gross per 24 hour   Intake 637.33 ml   Output 200 ml   Net 437.33 ml       General: obese female, awake, alert, oriented x 3, no acute distress.  Cardiovascular: regular rate and rhythm  Lungs: clear to auscultation bilaterally, bilateral air entry,normal work of breathing  Abdomen: soft, RLQ tenderness to light palpation - pain improved on the left side, non-distended, LLQ PD catheter  Extremities: no  edema      Meds:      Scheduled Meds: PRN Meds:    amLODIPine, 5 mg, Oral, Daily  atorvastatin, 40 mg, Oral, Daily  carvedilol, 25 mg, Oral, Q12H SCH  darbepoetin alfa, 60  mcg, Subcutaneous, Weekly  famotidine, 10 mg, Oral, Q12H SCH  insulin glargine, 15 Units, Subcutaneous, QHS  insulin lispro, 1-4 Units, Subcutaneous, QHS  insulin lispro, 1-8 Units, Subcutaneous, TID AC  insulin lispro, 3 Units, Subcutaneous, TID MEALS  lactulose, 30 g, Oral, Q8H SCH  magnesium sulfate, 1 g, Intravenous, Q1H  pantoprazole, 40 mg, Oral, BID  sevelamer, 800 mg, Oral, TID MEALS          Continuous Infusions:   sodium chloride 100 mL/hr at 05/14/19 1828    peritoneal dialysis with additives 0 mL/hr at 05/15/19 1817    lactated ringers Stopped (05/15/19 1409)    dextrose, 15 g of glucose, PRN   And  dextrose, 12.5 g, PRN   And  glucagon (rDNA), 1 mg, PRN  morphine, 2 mg, Q4H PRN  naloxone, 0.2 mg, PRN  ondansetron, 4 mg, Q6H PRN   Or  ondansetron, 4 mg, Q6H PRN  oxyCODONE-acetaminophen,  1 tablet, Q4H PRN              Labs:     Recent Labs   Lab 05/16/19  0354 05/15/19  0551 05/14/19  1306 05/14/19  0428 05/14/19  0428   WBC 6.53 4.48  --   --  5.65   Hgb 6.9* 7.2* 8.0*  More results in Results Review 7.1*   Hematocrit 23.3* 24.0* 26.5*  More results in Results Review 23.9*   Platelets 134* 122*  --   --  124*   More results in Results Review = values in this interval not displayed.     Recent Labs   Lab 05/16/19  0354 05/15/19  0957 05/14/19  0428 05/13/19  1132 05/13/19  1132   Sodium 138 139 142  More results in Results Review 142   Potassium 3.1* 3.6 4.0  More results in Results Review 3.5   Chloride 100 103 106  More results in Results Review 107   CO2 28 27 27   More results in Results Review 24   BUN 30.0* 33.0* 40.0*  More results in Results Review 37.0*   Creatinine 4.5* 4.7* 5.1*  More results in Results Review 4.5*   Calcium 7.2* 7.4* 7.5*  More results in Results Review 7.5*   Albumin 1.5* 1.5* 1.6*  --   --    Phosphorus  --   --  4.2  --  3.5   Magnesium 1.4*  --   --   --   --    Glucose 409* 256* 87  More results in Results Review 125*   EGFR 11.9 11.3 10.3  More results in Results  Review 11.9   More results in Results Review = values in this interval not displayed.       Recent Labs   Lab 05/12/19  1611   Urine Type Urine, Catheriz   Color, UA Yellow   Clarity, UA Sl Cloudy*   Specific Gravity UA 1.008   Urine pH 9.0*   Nitrite, UA Negative   Ketones UA Negative   Urobilinogen, UA 4.0*   Bilirubin, UA Negative   Blood, UA Large*   RBC, UA 26 - 50*   WBC, UA 26 - 50*           Signed by: Verl Blalock, MD

## 2019-05-16 NOTE — Progress Notes (Signed)
Pt is alert and oriented.  Pt denied pain and sob. PD access is optimally working. CCPD started to continue overnight. Timeouts and safety checks done. Reports given  To Mimo Tesfaye, RN.       05/16/19 1840   Initiating APD (Putting the Patient ON)    $APD Start Time 1840   Total Treatment Time Ordered (hours) 10 hours   Number of Cycles/Exchanges Ordered 5   Total Fill Volume (mL) 12500 mL   Fluid Orders for APD/CCPD   Cycler Bag #1 2.5 %, Low Calcium in 5000 mL   Cycler Bag #2 2.5 %, Low Calcium in 5000 mL   Cycler Bag #3 2.5 %, Low Calcium in 5000 mL   Completing APD/CCPD (Taking the Patient OFF)    Peritoneal Dialysis Comments CCPD started   Peritoneal Dialysis Catheter   No Placement Date or Time found.   Present on Admission?: Yes  Catheter Type: Baxter   Site Assessment Clean;Dry;Intact   Status Accessed   Site Condition No complications   Dressing Status Clean;Dry;Intact   Dressing Occlusive

## 2019-05-16 NOTE — Progress Notes (Signed)
CCPD completed, unable to retrieve treatment data from machine because machine was powered off by night RN. Day RN advised to remind night RN during hand off to not turn PD machine off but to silence it instead should machine alarm go off.     05/16/19 0800   Completing APD/CCPD (Taking the Patient OFF)    Effluent Appearance Cloudy;Yellow   Specimen Collected No   Peritoneal Dialysis Comments CCPD completed   Mid-Day Manual Exchange    Is there a mid-day exchange? No   Peritoneal Dialysis Catheter   No Placement Date or Time found.   Present on Admission?: Yes  Catheter Type: Baxter   Site Assessment Clean;Dry;Intact   Dressing/Line Intervention Caps changed

## 2019-05-16 NOTE — Progress Notes (Signed)
CM made Referral for TCM.

## 2019-05-16 NOTE — Progress Notes (Signed)
1 unit of blood transfusion started. No distress noted. Will continue to monitor pt per protocol.      05/16/19 1819   Vitals   BP 137/77   Temp 98.3 F (36.8 C)   Temp Source Oral   Resp Rate 18   Oxygen Therapy   SpO2 96 %   O2 Device None (Room air)   Pre-Transfusion Documentation   Pre-Meds Given? No   Informed Consent on chart? Yes   ID band Patient Name & MRN match transfusion label ? Yes   BRID/ Blood Bank number matches pt band? Yes

## 2019-05-16 NOTE — Plan of Care (Signed)
Problem: Moderate/High Fall Risk Score >5  Goal: Patient will remain free of falls  Outcome: Progressing  Flowsheets (Taken 05/16/2019 1905)  Moderate Risk (6-13):   MOD-Consider activation of bed alarm if appropriate   MOD-Perform dangle, stand, walk (DSW) when getting patient up   MOD-Use gait belt when appropriate     Problem: Safety  Goal: Patient will be free from injury during hospitalization  Outcome: Progressing  Flowsheets (Taken 05/13/2019 0827 by Julien Girt, RN)  Patient will be free from injury during hospitalization:   Provide and maintain safe environment   Assess patient's risk for falls and implement fall prevention plan of care per policy   Use appropriate transfer methods   Ensure appropriate safety devices are available at the bedside   Include patient/ family/ care giver in decisions related to safety   Hourly rounding     Problem: Psychosocial and Spiritual Needs  Goal: Demonstrates ability to cope with hospitalization/illness  Outcome: Progressing  Flowsheets (Taken 05/15/2019 2050 by Jory Sims, RN)  Demonstrates ability to cope with hospitalizations/illness:   Provide quiet environment   Encourage participation in diversional activity   Encourage verbalization of feelings/concerns/expectations     Problem: Renal Instability  Goal: Nutritional intake is adequate  Outcome: Progressing  Flowsheets (Taken 05/16/2019 1905)  Nutritional intake is adequate:   Assist patient with meals/food selection   Allow adequate time for meals     Problem: Altered GI Function  Goal: Nutritional intake is adequate  Outcome: Progressing  Flowsheets (Taken 05/16/2019 1905)  Nutritional intake is adequate:   Assist patient with meals/food selection   Allow adequate time for meals     Patient alert and oriented x4. Patient denies SOB, dizziness, nausea. Medications administered. Vitals checked prior to blood administration. Fall bundle in place. Will continue to monitor.

## 2019-05-16 NOTE — Progress Notes (Signed)
INFECTIOUS DISEASES PROGRESS NOTE    Date Time: 05/16/19 2:29 PM  Patient Name: [name],[name]  Patient status.Inpatient  Hospital Day: 4    Assessment and Plan:   1. PD Cath peritonitis/ Ecoli (S)to cefazolin      PLAN: Change antibiotic to cefazolin  If abd pain persists may need cath pulled  Subjective:   Non toxic    Review of Systems:   Review of Systems - Gastrointestinal ROS: positive for - abdominal pain  negative for - diarrhea or nausea/vomiting    Antibiotics:   Day 3    Other medications reviewed in EPIC.  Central Access:       Physical Exam:   T max 99  Vitals:    05/16/19 1129   BP: 133/78   Pulse: 66   Resp:    Temp:    SpO2: 94%       Abdomen - tenderness noted diffusely but especially around cath. BS present    Labs:     Microbiology Results     Procedure Component Value Units Date/Time    Blood Culture Aerobic/Anaerobic #1 [[id number]] Collected: 05/12/19 1855    Specimen: Arm from Blood, Venipuncture Updated: 05/15/19 2221    Narrative:      ORDER#: [id number]                                    ORDERED BY: Dwyane Dee, VINOD  SOURCE: Blood, Venipuncture Arm                      COLLECTED:  05/12/19 18:55  ANTIBIOTICS AT COLL.:                                RECEIVED :  05/12/19 21:53  Culture Blood Aerobic and Anaerobic        PRELIM      05/15/19 22:21  05/13/19   No Growth after 1 day/s of incubation.  05/14/19   No Growth after 2 day/s of incubation.  05/15/19   No Growth after 3 day/s of incubation.      Blood Culture Aerobic/Anaerobic #2 [[id number]] Collected: 05/12/19 1855    Specimen: Arm from Blood, Venipuncture Updated: 05/15/19 2221    Narrative:      ORDER#: [id number]                                    ORDERED BY: Dwyane Dee, VINOD  SOURCE: Blood, Venipuncture Arm                      COLLECTED:  05/12/19 18:55  ANTIBIOTICS AT COLL.:                                RECEIVED :  05/12/19 21:53  Culture Blood Aerobic and Anaerobic        PRELIM      05/15/19 22:21  05/13/19   No Growth after 1 day/s  of incubation.  05/14/19   No Growth after 2 day/s of incubation.  05/15/19   No Growth after 3 day/s of incubation.      COVID-19 (SARS-COV-2) [facility] Rapid) [[id number]] Collected: 05/12/19 1611    Specimen: Nasopharyngeal Swab from  Nasopharynx Updated: 05/12/19 1648     Purpose of COVID testing Screening     SARS-CoV-2 Specimen Source Nasopharyngeal     SARS CoV 2 Overall Result Negative     Comment: Test performed using the Abbott ID NOW EUA assay.  Please see Fact Sheets for patients and providers located at:  http://olson-hall.info/  This test is for the qualitative detection of SARS-CoV-2  (COVID19) nucleic acid. Viral nucleic acids may persist in vivo,  independent of viability. Detection of viral nucleic acid does  not imply the presence of infectious virus, or that virus  nucleic acid is the cause of clinical symptoms. Negative  results should be treated as presumptive and, if inconsistent  with clinical signs and symptoms or necessary for patient  management, should be tested with an alternative molecular  assay. Negative results do not preclude SARS-CoV-2 infection  and should not be used as the sole basis for patient  management decisions. Invalid results may be due to inhibiting  substances in the specimen and recollection should occur.         Narrative:      o Collect and clearly label specimen type:  o Upper respiratory specimen: One Nasopharyngeal Dry Swab NO  Transport Media.  o Hand deliver to laboratory ASAP  Indication for testing->Extended care facility admission to  semi private room    Culture + Gram Lenise Arena, Body Fluid [[id number]] Collected: 05/13/19 1447    Specimen: Body Fluid from [facility]ites Fluid Updated: 05/15/19 1818    Narrative:      [id number]_ called Micro Results of fluid. Results read back [id number], by [id number]  on 05/15/2019 at 18:17  ORDER#: [id number]                                    ORDERED BY: RANA, IRMINDRA  SOURCE: Ascites Fluid ascites                         COLLECTED:  05/13/19 14:47  ANTIBIOTICS AT COLL.:                                RECEIVED :  05/13/19 19:33  [id number]_ called Micro Results of fluid. Results read back [id number], by [id number] on 05/15/2019 at 18:17  Stain, Gram                                FINAL       05/13/19 21:48  05/13/19   Many WBCs             No organisms seen  Culture and Gram Stain, Aerobic, Body FluidFINAL       05/15/19 18:18   +  05/15/19   Very light growth of Escherichia coli      _____________________________________________________________________________                                     E.coli       ANTIBIOTICS                     MIC  INTRP      _____________________________________________________________________________  Amoxicillin/CA  8/4    S        Ampicillin                      >16    R        Ampicillin/sulbactam           16/8    I        Aztreonam                       <=2    S        Cefazolin                        2     S        Cefepime                        <=1    S        Cefoxitin                       <=4    S        Ceftazidime                     <=2    S        Ceftriaxone                     <=1    S        Cefuroxime                      <=4    S        Ciprofloxacin                 <=0.25   S        Ertapenem                     <=0.25   S        Gentamicin                      <=2    S        Levofloxacin                   <=0.5   S        Meropenem                      <=0.5   S        Piperacillin/Tazobactam        <=2/4   S        Tetracycline                    >8     R        Trimethoprim/Sulfamethoxazole  >2/38   R        _____________________________________________________________________________            S=SUSCEPTIBLE     I=INTERMEDIATE     R=RESISTANT                            N/S=NON-SUSCEPTIBLE  _____________________________________________________________________________      Urine culture [[id number]] Collected: 05/12/19 1611  Specimen: Bladder Urine Updated: 05/14/19 0909     Narrative:      Replace urinary catheter prior to obtaining the urine culture  if it has been in place for greater than or equal to 14  days:->N/A No Foley  Indications for U/A Reflex to Micro - Reflex to  Culture:->Suprapubic Pain/Tenderness or Dysuria  ORDER#: [id number]                                    ORDERED BY: HAILE, LYDIA  SOURCE: Urine                                        COLLECTED:  05/12/19 16:11  ANTIBIOTICS AT COLL.:                                RECEIVED :  05/12/19 16:26  Culture Urine                              FINAL       05/14/19 09:09  05/14/19   >100,000 CFU/ML of multiple bacterial morphotypes present.             Possible contamination, appropriate recollection is             requested if clinically indicated.            CBC w/Diff CMP   Recent Labs   Lab 05/16/19  0354 05/15/19  0551 05/14/19  1306 05/14/19  0428 05/14/19  0428   WBC 6.53 4.48  --   --  5.65   Hgb 6.9* 7.2* 8.0*  More results in Results Review 7.1*   Hematocrit 23.3* 24.0* 26.5*  More results in Results Review 23.9*   Platelets 134* 122*  --   --  124*   MCV 98.3* 96.8*  --   --  98.4*   Segmented Neutrophils 78 74  --   --  63   More results in Results Review = values in this interval not displayed.       PT/INR         Recent Labs   Lab 05/16/19  0354 05/15/19  0957 05/14/19  0428 05/13/19  1132 05/12/19  1455   Sodium 138 139 142 142  More results in Results Review   Potassium 3.1* 3.6 4.0 3.5  More results in Results Review   Chloride 100 103 106 107  More results in Results Review   CO2 28 27 27 24   More results in Results Review   BUN 30.0* 33.0* 40.0* 37.0*  More results in Results Review   Creatinine 4.5* 4.7* 5.1* 4.5*  More results in Results Review   Glucose 409* 256* 87 125*  More results in Results Review   Calcium 7.2* 7.4* 7.5* 7.5*  More results in Results Review   Magnesium 1.4*  --   --   --   --    Phosphorus  --   --  4.2 3.5  --    Protein, Total 4.6* 4.2* 4.4*  --   More results in Results Review    Albumin 1.5* 1.5* 1.6*  --   More results in Results Review   AST (  SGOT) 5 4* 6  --   More results in Results Review   ALT 6 11 8   --   More results in Results Review   Alkaline Phosphatase 95 91 89  --   More results in Results Review   Bilirubin, Total 0.2 0.3 0.4  --   More results in Results Review   More results in Results Review = values in this interval not displayed.      Glucose POCT   Recent Labs   Lab 05/16/19  0354 05/15/19  0957 05/14/19  0428 05/13/19  1132 05/12/19  1455   Glucose 409* 256* 87 125* 551*          Rads:     Radiology Results (24 Hour)     ** No results found for the last 24 hours. **            Signed by: Ernst Breach

## 2019-05-16 NOTE — Plan of Care (Addendum)
Patient admitted on 4/19 with viral gastroenteritis and UTI.    Patient is alert and orientated x4 with moments of forgetfulness, VSS on room air. Complains of lower abdominal pain, 8/10 cramping and burning, stated pain decreased 4/10. Urine output diminished, no bm on shift.  Repositioning hourly.     H&H dropped to 6.9, patient is IPatient is hemodynamically stable. Notified MD Particia Jasper.     Safety measures implemented, call bell within reach.       Problem: Moderate/High Fall Risk Score >5  Goal: Patient will remain free of falls  Outcome: Progressing  Flowsheets (Taken 05/15/2019 2200)  Moderate Risk (6-13):   MOD-Perform dangle, stand, walk (DSW) when getting patient up   MOD-Utilize diversion activities   MOD-Re-orient confused patients   MOD-Place bedside commode and assistive devices out of sight when not in use   MOD-Remain with patient during toileting   MOD-Consider a move closer to [place]   MOD-Floor mat at bedside (where available) if appropriate   MOD-Apply bed exit alarm if patient is confused   MOD-Consider activation of bed alarm if appropriate     Problem: Safety  Goal: Patient will be free from injury during hospitalization  Outcome: Progressing  Flowsheets (Taken 05/13/2019 0827 by Julien Girt, RN)  Patient will be free from injury during hospitalization:   Provide and maintain safe environment   Assess patient's risk for falls and implement fall prevention plan of care per policy   Use appropriate transfer methods   Ensure appropriate safety devices are available at the bedside   Include patient/ family/ care giver in decisions related to safety   Hourly rounding     Problem: Pain  Goal: Pain at adequate level as identified by patient  Outcome: Progressing  Flowsheets (Taken 05/15/2019 0319 by Candice Camp, RN)  Pain at adequate level as identified by patient:   Assess for risk of opioid induced respiratory depression, including snoring/sleep apnea. Alert healthcare  team of risk factors identified.   Identify patient comfort function goal   Reassess pain within 30-60 minutes of any procedure/intervention, per Pain Assessment, Intervention, Reassessment (AIR) Cycle   Evaluate if patient comfort function goal is met   Assess pain on admission, during daily assessment and/or before any "as needed" intervention(s)   Evaluate patient's satisfaction with pain management progress   Offer non-pharmacological pain management interventions   Consult/collaborate with Pain Service     Problem: Side Effects from Pain Analgesia  Goal: Patient will experience minimal side effects of analgesic therapy  Outcome: Progressing  Flowsheets (Taken 05/13/2019 1949 by Jill Alexanders R, RN)  Patient will experience minimal side effects of analgesic therapy: Monitor/assess patient's respiratory status (RR depth, effort, breath sounds)     Problem: Discharge Barriers  Goal: Patient will be discharged home or other facility with appropriate resources  Outcome: Progressing  Flowsheets (Taken 05/16/2019 0249)  Discharge to home or other facility with appropriate resources:   Provide appropriate patient education   Provide information on available health resources   Initiate discharge planning     Problem: Compromised Tissue integrity  Goal: Damaged tissue is healing and protected  Outcome: Progressing  Flowsheets (Taken 05/15/2019 2050 by Jory Sims, RN)  Damaged tissue is healing and protected:   Monitor/assess Braden scale every shift   Reposition patient every 2 hours and as needed unless able to reposition self   Increase activity as tolerated/progressive mobility   Keep intact skin clean and dry  Goal: Nutritional status is  improving  Outcome: Progressing  Flowsheets (Taken 05/15/2019 0319 by Candice Camp, RN)  Nutritional status is improving:   Encourage patient to take dietary supplement(s) as ordered   Assist patient with eating   Include patient/patient care companion in decisions  related to nutrition   Collaborate with Clinical Nutritionist   Allow adequate time for meals     Problem: Renal Instability  Goal: Fluid and electrolyte balance are achieved/maintained  Outcome: Progressing  Flowsheets (Taken 05/15/2019 1809 by Mickel Baas, RN)  Fluid and electrolyte balance are achieved/maintained:   Monitor/assess lab values and report abnormal values   Monitor daily weight   Assess and reassess fluid and electrolyte status   Observe for cardiac arrhythmias  Goal: Nutritional intake is adequate  Outcome: Progressing  Flowsheets (Taken 05/15/2019 1809 by Mickel Baas, RN)  Nutritional intake is adequate:   Assess anorexia, appetite, and amount of meal/food tolerated   Consult/collaborate with Clinical Nutritionist   Assist patient with meals/food selection   Monitor daily weights  Goal: Perineal skin integrity is maintained or improved  Outcome: Progressing  Flowsheets (Taken 05/15/2019 2050 by Jory Sims, RN)  Perineal skin integrity is maintained or improved: Keep intact skin clean and dry  Goal: Free from infection  Outcome: Progressing  Flowsheets (Taken 05/15/2019 1809 by Mickel Baas, RN)  Free from infection: Monitor/assess for signs and symptoms of infection     Problem: Altered GI Function  Goal: Nutritional intake is adequate  Outcome: Progressing  Flowsheets (Taken 05/15/2019 1809 by Mickel Baas, RN)  Nutritional intake is adequate:   Assess anorexia, appetite, and amount of meal/food tolerated   Consult/collaborate with Clinical Nutritionist   Assist patient with meals/food selection   Monitor daily weights  Goal: Fluid and electrolyte balance are achieved/maintained  Outcome: Progressing  Flowsheets (Taken 05/15/2019 0319 by Candice Camp, RN)  Fluid and electrolyte balance are achieved/maintained:   Monitor intake and output every shift   Monitor/assess lab values and report abnormal values   Assess for confusion/personality changes   Provide adequate  hydration   Assess and reassess fluid and electrolyte status   Observe for seizure activity and initiate seizure precautions if indicated   Monitor for muscle weakness   Observe for cardiac arrhythmias   Monitor daily weight  Goal: Elimination patterns are normal or improving  Outcome: Progressing  Flowsheets (Taken 05/15/2019 2050 by Jory Sims, RN)  Elimination patterns are normal or improving:   Report abnormal assessment to physician   Anticipate/assist with toileting needs   Assess for normal bowel sounds   Monitor for abdominal distension   Monitor for abdominal discomfort  Goal: Mobility/Activity is maintained at optimal level for patient  Outcome: Progressing  Flowsheets (Taken 05/13/2019 1949 by Brandon Melnick, Mimo R, RN)  Mobility/activity is maintained at optimal level for patient:   Increase mobility as tolerated/progressive mobility   Encourage independent activity per ability  Goal: No bleeding  Outcome: Progressing  Flowsheets (Taken 05/15/2019 2050 by Jory Sims, RN)  No bleeding: Monitor and assess vitals and hemodynamic parameters

## 2019-05-16 NOTE — Progress Notes (Addendum)
[facility]  PROGRESS NOTE      Patient: [name]  Date: 05/16/2019   LOS: 4 Days  Admission Date: 05/12/2019   MRN: [id number]  Attending: Ardeth Sportsman  Please contact me on epic chat       ASSESSMENT/PLAN     [name] is a 64 y.o. female with history of hypertension, hyperlipidemia, insulin-dependent diabetes mellitus, COPD, end-stage renal disease on peritoneal dialysis who presents to the emergency department for evaluation of a 3-day history of intractable nausea, vomiting and nonbloody diarrhea, admitted for viral gastroenteritis and UTI.    Patient Active Hospital Problem List:    #UGIB - Patient had 1 episode of 400 cc coffee ground emesis on 4/20 per RN  #Possible melena per patient report  -s/p EGD on 4/21: Grade D gastritis  -Continue Protonix 40 mg BID  -Hold ASA and Cilostazol  -Continue to monitor H/H  -GI recommendations appreciated. Outpatient follow-up for repeat EGD.    #GNR Peritonitis, pansensitive   #Abdominal pain  #Nausea, vomiting, diarrhea - Resolved  #UTI  -Likely due to viral gastroenteritis/UTI/Peritonitis  -Peritoneal fluid with 51,000 WBC's. Follow-up fluid culture results  -Blood cultures NGTD, lactic acid normal and lipase normal  -Urine culture: >100,000 CFU/ML of multiple bacterial morphotypes   -CT abd/pelvis wo contrast reveals no evidence of bowel obstruction, bowel ischemia, perforation, diverticulitis, pancreatitis, nephrolithiasis and shows intraperitoneal fluid  -Continue patient's home percocet for moderate pain and IV morphine for severe pain  -PRN antiemetics for nausea/vomiting  -Continue IP Fortaz with PD exchanges  -ID recommendations appreciated    #Hypokalemia  #Hypomagnesemia  -K 3.1, potassium chloride 79meq once  -Mg 1.4, magnesium sulfate 1g every 1 hour x2    #Abdominal pain  -Likely due to above  -Pt also reports constipation x1 week. Will start BR     #ESRD on peritoneal dialysis  -Started on dialysis on 05/2018  -CT abd/pelvis shows  moderate amount of ascites ? Dialysis fluid  -Under care everywhere, noted that Cr from 10/2018-11/2018 has been 3.8-5 range  -Continue PD with IP antibiotics  -Nephrology following    #Hypertension   -Continue home amlodipine, coreg    #Hyperglycemia  #Type 2 DM  -Continue home lantus, SSI, ac/hs accuchecks  -HbA1c 9.8%    #Hyperlipidemia   -Continue home statin    #Iron deficiency anemia  -Continue IV Venofer - Day 2/3  -Continue to monitor H/H  -Hg 6.9, will order 1 unit pRBC transfusion     Nutrition  Diabetic, renal diet       DVT prophylaxis   SCD     Code Status: DNR    DISPO: Pending improvement of abdominal pain    Family Contact: Larene Beach - [phone]; Called and updated daughter on patient's condition and treatment plan.     SUBJECTIVE   Patient seen and examined at bedside.  She states that her abdominal pain has slightly improved. Denies nausea/vomiting. Reports to feeling constipated.    MEDICATIONS     Current Facility-Administered Medications   Medication Dose Route Frequency    amLODIPine  5 mg Oral Daily    atorvastatin  40 mg Oral Daily    carvedilol  25 mg Oral Q12H SCH    darbepoetin alfa  60 mcg Subcutaneous Weekly    famotidine  10 mg Oral Q12H SCH    insulin glargine  15 Units Subcutaneous QHS    insulin lispro  1-4 Units Subcutaneous QHS    insulin lispro  1-8 Units Subcutaneous TID AC    insulin lispro  3 Units Subcutaneous TID MEALS    lactulose  30 g Oral Q8H SCH    pantoprazole  40 mg Oral BID    sevelamer  800 mg Oral TID MEALS       PHYSICAL EXAM     Vitals:    05/16/19 1001   BP: 165/72   Pulse: 66   Resp:    Temp:    SpO2:        Temperature: Temp  Min: 98.4 F (36.9 C)  Max: 99.1 F (37.3 C)  Pulse: Pulse  Min: 66  Max: 72  Respiratory: Resp  Min: 14  Max: 20  Non-Invasive BP: BP  Min: 93/54  Max: 165/72  Pulse Oximetry SpO2  Min: 92 %  Max: 97 %    Intake and Output Summary (Last 24 hours) at Date Time    Intake/Output Summary (Last 24 hours) at 05/16/2019  1105  Last data filed at 05/15/2019 2300  Gross per 24 hour   Intake 637.33 ml   Output 200 ml   Net 437.33 ml       GEN APPEARANCE: Normal;  A&OX3  HEENT: PERLA; EOMI; Conjunctiva Clear  NECK: Supple; No bruits  CVS: RRR, S1, S2; No M/G/R  LUNGS: CTAB; No Wheezes; No Rhonchi: No rales  ABD: Soft; diffuse abdominal tenderness with light touch; + Normoactive BS  EXT: No edema; Pulses 2+ and intact  Skin exam:  pink  NEURO: non focal, moves all extremities   CAP REFILL:  Normal  MENTAL STATUS: flat affect    LABS     Recent Labs   Lab 05/16/19  0354 05/15/19  0551 05/14/19  1306 05/14/19  0428 05/14/19  0428   WBC 6.53 4.48  --   --  5.65   RBC 2.37* 2.48*  --   --  2.43*   Hgb 6.9* 7.2* 8.0*  More results in Results Review 7.1*   Hematocrit 23.3* 24.0* 26.5*  More results in Results Review 23.9*   MCV 98.3* 96.8*  --   --  98.4*   Platelets 134* 122*  --   --  124*   More results in Results Review = values in this interval not displayed.       Recent Labs   Lab 05/16/19  0354 05/15/19  0957 05/14/19  0428 05/13/19  1132 05/12/19  1455   Sodium 138 139 142 142 137   Potassium 3.1* 3.6 4.0 3.5 3.5   Chloride 100 103 106 107 100   CO2 28 27 27 24 24    BUN 30.0* 33.0* 40.0* 37.0* 38.0*   Creatinine 4.5* 4.7* 5.1* 4.5* 4.7*   Glucose 409* 256* 87 125* 551*   Calcium 7.2* 7.4* 7.5* 7.5* 8.3*       Recent Labs   Lab 05/16/19  0354 05/15/19  0957 05/14/19  0428   ALT 6 11 8    AST (SGOT) 5 4* 6   Bilirubin, Total 0.2 0.3 0.4   Albumin 1.5* 1.5* 1.6*   Alkaline Phosphatase 95 91 89             Microbiology Results     Procedure Component Value Units Date/Time    Blood Culture Aerobic/Anaerobic #1 [[id number]] Collected: 05/12/19 1855    Specimen: Arm from Blood, Venipuncture Updated: 05/13/19 2221    Narrative:      ORDER#: [id number]  ORDERED BY: KUMAR, VINOD  SOURCE: Blood, Venipuncture Arm                      COLLECTED:  05/12/19 18:55  ANTIBIOTICS AT COLL.:                                 RECEIVED :  05/12/19 21:53  Culture Blood Aerobic and Anaerobic        PRELIM      05/13/19 22:21  05/13/19   No Growth after 1 day/s of incubation.      Blood Culture Aerobic/Anaerobic #2 [[id number]] Collected: 05/12/19 1855    Specimen: Arm from Blood, Venipuncture Updated: 05/13/19 2221    Narrative:      ORDER#: [id number]                                    ORDERED BY: Dwyane Dee, VINOD  SOURCE: Blood, Venipuncture Arm                      COLLECTED:  05/12/19 18:55  ANTIBIOTICS AT COLL.:                                RECEIVED :  05/12/19 21:53  Culture Blood Aerobic and Anaerobic        PRELIM      05/13/19 22:21  05/13/19   No Growth after 1 day/s of incubation.      COVID-19 (SARS-COV-2) [employer] [facility]) [[id number]] Collected: 05/12/19 1611    Specimen: Nasopharyngeal Swab from Nasopharynx Updated: 05/12/19 1648     Purpose of COVID testing Screening     SARS-CoV-2 Specimen Source Nasopharyngeal     SARS CoV 2 Overall Result Negative     Comment: Test performed using the Abbott ID NOW EUA assay.  Please see Fact Sheets for patients and providers located at:  http://olson-hall.info/  This test is for the qualitative detection of SARS-CoV-2  (COVID19) nucleic acid. Viral nucleic acids may persist in vivo,  independent of viability. Detection of viral nucleic acid does  not imply the presence of infectious virus, or that virus  nucleic acid is the cause of clinical symptoms. Negative  results should be treated as presumptive and, if inconsistent  with clinical signs and symptoms or necessary for patient  management, should be tested with an alternative molecular  assay. Negative results do not preclude SARS-CoV-2 infection  and should not be used as the sole basis for patient  management decisions. Invalid results may be due to inhibiting  substances in the specimen and recollection should occur.         Narrative:      o Collect and clearly label specimen type:  o Upper respiratory specimen: One  Nasopharyngeal Dry Swab NO  Transport Media.  o Hand deliver to laboratory ASAP  Indication for testing->Extended care facility admission to  semi private room    Culture + Gram Lenise Arena, Body Fluid [[id number]] Collected: 05/13/19 1447    Specimen: Body Fluid from Ascites Fluid Updated: 05/13/19 2148    Narrative:      ORDER#: [id number]  ORDERED BY: RANA, IRMINDRA  SOURCE: Ascites Fluid ascites                        COLLECTED:  05/13/19 14:47  ANTIBIOTICS AT COLL.:                                RECEIVED :  05/13/19 19:33  Stain, Gram                                FINAL       05/13/19 21:48  05/13/19   Many WBCs             No organisms seen  Culture and Gram Stain, Aerobic, Body FluidPENDING      Urine culture [[id number]] Collected: 05/12/19 1611    Specimen: Bladder Urine Updated: 05/14/19 0909    Narrative:      Replace urinary catheter prior to obtaining the urine culture  if it has been in place for greater than or equal to 14  days:->N/A No Foley  Indications for U/A Reflex to Micro - Reflex to  Culture:->Suprapubic Pain/Tenderness or Dysuria  ORDER#: [id number]                                    ORDERED BY: HAILE, LYDIA  SOURCE: Urine                                        COLLECTED:  05/12/19 16:11  ANTIBIOTICS AT COLL.:                                RECEIVED :  05/12/19 16:26  Culture Urine                              FINAL       05/14/19 09:09  05/14/19   >100,000 CFU/ML of multiple bacterial morphotypes present.             Possible contamination, appropriate recollection is             requested if clinically indicated.            RADIOLOGY     No results found.    Keitha Butte Teandre Hamre  11:05 AM 05/16/2019

## 2019-05-17 ENCOUNTER — Inpatient Hospital Stay: Payer: Medicaid Other

## 2019-05-17 DIAGNOSIS — R079 Chest pain, unspecified: Secondary | ICD-10-CM

## 2019-05-17 LAB — CBC AND DIFFERENTIAL
Absolute NRBC: 0.07 10*3/uL — ABNORMAL HIGH (ref 0.00–0.00)
Basophils Absolute Automated: 0.01 10*3/uL (ref 0.00–0.08)
Basophils Automated: 0.1 %
Eosinophils Absolute Automated: 0.09 10*3/uL (ref 0.00–0.44)
Eosinophils Automated: 1 %
Hematocrit: 30.7 % — ABNORMAL LOW (ref 34.7–43.7)
Hgb: 9.3 g/dL — ABNORMAL LOW (ref 11.4–14.8)
Immature Granulocytes Absolute: 0.1 10*3/uL — ABNORMAL HIGH (ref 0.00–0.07)
Immature Granulocytes: 1.1 %
Lymphocytes Absolute Automated: 1.16 10*3/uL (ref 0.42–3.22)
Lymphocytes Automated: 12.4 %
MCH: 29 pg (ref 25.1–33.5)
MCHC: 30.3 g/dL — ABNORMAL LOW (ref 31.5–35.8)
MCV: 95.6 fL (ref 78.0–96.0)
MPV: 12.2 fL (ref 8.9–12.5)
Monocytes Absolute Automated: 0.82 10*3/uL (ref 0.21–0.85)
Monocytes: 8.7 %
Neutrophils Absolute: 7.21 10*3/uL — ABNORMAL HIGH (ref 1.10–6.33)
Neutrophils: 76.7 %
Nucleated RBC: 0.7 /100 WBC — ABNORMAL HIGH (ref 0.0–0.0)
Platelets: 145 10*3/uL (ref 142–346)
RBC: 3.21 10*6/uL — ABNORMAL LOW (ref 3.90–5.10)
RDW: 14 % (ref 11–15)
WBC: 9.39 10*3/uL (ref 3.10–9.50)

## 2019-05-17 LAB — RENAL FUNCTION PANEL
Albumin: 1.4 g/dL — ABNORMAL LOW (ref 3.5–5.0)
Anion Gap: 10 (ref 5.0–15.0)
BUN: 24 mg/dL — ABNORMAL HIGH (ref 7.0–19.0)
CO2: 28 mEq/L (ref 22–29)
Calcium: 7.4 mg/dL — ABNORMAL LOW (ref 8.5–10.5)
Chloride: 103 mEq/L (ref 100–111)
Creatinine: 4.3 mg/dL — ABNORMAL HIGH (ref 0.6–1.0)
Glucose: 164 mg/dL — ABNORMAL HIGH (ref 70–100)
Phosphorus: 3.8 mg/dL (ref 2.3–4.7)
Potassium: 3 mEq/L — ABNORMAL LOW (ref 3.5–5.1)
Sodium: 141 mEq/L (ref 136–145)

## 2019-05-17 LAB — BODY FLUID CELL COUNT
Body Fluid Eosinophils: 4 %
Body Fluid Lymphocytes: 18 %
Body Fluid Monocyte/Macrophage Cells: 5 %
Body Fluid Polymorphonuclear Cell: 73 % — ABNORMAL HIGH (ref <25)
Body Fluid RBC: <2000 /mm3
Body Fluid WBC: 299 /mm3

## 2019-05-17 LAB — HEMOLYSIS INDEX
Hemolysis Index: 1 (ref 0–18)
Hemolysis Index: 4 (ref 0–18)

## 2019-05-17 LAB — GLUCOSE WHOLE BLOOD - POCT
Whole Blood Glucose POCT: 200 mg/dL — ABNORMAL HIGH (ref 70–100)
Whole Blood Glucose POCT: 118 mg/dL — ABNORMAL HIGH (ref 70–100)
Whole Blood Glucose POCT: 126 mg/dL — ABNORMAL HIGH (ref 70–100)
Whole Blood Glucose POCT: 276 mg/dL — ABNORMAL HIGH (ref 70–100)

## 2019-05-17 LAB — GFR: EGFR: 12.5

## 2019-05-17 LAB — IHS D-DIMER: D-Dimer: 2.6 ug/mL FEU — ABNORMAL HIGH (ref 0.00–0.60)

## 2019-05-17 LAB — TROPONIN I
Troponin I: 0.05 ng/mL (ref 0.00–0.05)
Troponin I: 0.04 ng/mL (ref 0.00–0.05)

## 2019-05-17 LAB — MAGNESIUM
Magnesium: 1.7 mg/dL (ref 1.6–2.6)
Magnesium: 1.8 mg/dL (ref 1.6–2.6)

## 2019-05-17 MED ORDER — POTASSIUM CHLORIDE CRYS ER 10 MEQ PO TBCR
40.00 meq | EXTENDED_RELEASE_TABLET | Freq: Once | ORAL | Status: AC
Start: 2019-05-17 — End: 2019-05-17
  Administered 2019-05-17: 13:00:00 40 meq via ORAL
  Filled 2019-05-17: qty 4

## 2019-05-17 MED ORDER — CEFAZOLIN SODIUM 1 G IJ SOLR
Freq: Every day | INTRAMUSCULAR | Status: DC
Start: 2019-05-17 — End: 2019-05-19
  Filled 2019-05-17 (×3): qty 5000

## 2019-05-17 NOTE — Progress Notes (Signed)
[facility] PROGRESS NOTE  Aaron Edelman, x [phone] [facility] Spectralink)      Date Time: 05/17/19 7:38 AM  Patient Name: [name]  Attending Physician: Ardeth Sportsman, MD    CC: follow-up ESRD  PD note    Assessment:     1. PD associated peritonitis - E Coli  2. ESRD- on CAPD  3. Anemia- CKD + Fe deficiency now exacerbated by GI bleed  4. Volume overload  5. Hypokalemia  6. Hypomagnesemia        Recommendations:   1. Will order repeat cell count  2. Replete K and Mg  3. Cont Aranesp   4. Cont IP abx with cefazolin      Case discussed with:  Pt, PD RN    Corky Mull, MD   [facility]  [phone] (office)  X [phone] (FFX Spectra-Link)      Subjective:   Awake; abd pain improving    Review of Systems:   Cardio: no chest pain, no edema  Pulm: no SOB  Abd: + tenderness in the RLQ      Physical Exam:     Vitals:    05/16/19 2057 05/16/19 2200 05/16/19 2317 05/17/19 0427   BP: 152/72 149/71 151/59 154/73   Pulse: 67 65 65 64   Resp: 16 18 17 16    Temp: 98.8 F (37.1 C) 99 F (37.2 C) 99 F (37.2 C) 99.1 F (37.3 C)   TempSrc: Oral Oral Oral Oral   SpO2: 97% 98% 97% 98%   Weight:    120.2 kg (265 lb 1.6 oz)   Height:           Intake and Output Summary (Last 24 hours) at Date Time    Intake/Output Summary (Last 24 hours) at 05/17/2019 4103  Last data filed at 05/17/2019 0600  Gross per 24 hour   Intake 1020.83 ml   Output 300 ml   Net 720.83 ml       General: obese female, awake, alert, oriented x 3, no acute distress.  Cardiovascular: regular rate and rhythm  Lungs: clear to auscultation bilaterally, bilateral air entry,normal work of breathing  Abdomen: soft, RLQ tenderness to light palpation - pain improved on the left side, non-distended, LLQ PD catheter  Extremities: +edema      Meds:      Scheduled Meds: PRN Meds:    amLODIPine, 5 mg, Oral, Daily  atorvastatin, 40 mg, Oral, Daily  carvedilol, 25 mg, Oral, Q12H SCH  darbepoetin alfa, 60 mcg, Subcutaneous, Weekly  famotidine, 10 mg,  Oral, Q12H SCH  insulin glargine, 15 Units, Subcutaneous, QHS  insulin lispro, 1-4 Units, Subcutaneous, QHS  insulin lispro, 1-8 Units, Subcutaneous, TID AC  insulin lispro, 3 Units, Subcutaneous, TID MEALS  lactulose, 30 g, Oral, Q8H SCH  pantoprazole, 40 mg, Oral, BID  sevelamer, 800 mg, Oral, TID MEALS          Continuous Infusions:   sodium chloride 100 mL/hr at 05/14/19 1828    peritoneal dialysis with additives      lactated ringers Stopped (05/15/19 1409)    sodium chloride, , PRN  dextrose, 15 g of glucose, PRN   And  dextrose, 12.5 g, PRN   And  glucagon (rDNA), 1 mg, PRN  morphine, 2 mg, Q4H PRN  naloxone, 0.2 mg, PRN  ondansetron, 4 mg, Q6H PRN   Or  ondansetron, 4 mg, Q6H PRN  oxyCODONE-acetaminophen, 1 tablet, Q4H PRN  Labs:     Recent Labs   Lab 05/16/19  0354 05/15/19  0551 05/14/19  1306 05/14/19  0428 05/14/19  0428   WBC 6.53 4.48  --   --  5.65   Hgb 6.9* 7.2* 8.0*  More results in Results Review 7.1*   Hematocrit 23.3* 24.0* 26.5*  More results in Results Review 23.9*   Platelets 134* 122*  --   --  124*   More results in Results Review = values in this interval not displayed.     Recent Labs   Lab 05/16/19  0354 05/15/19  0957 05/14/19  0428 05/13/19  1132 05/13/19  1132   Sodium 138 139 142  More results in Results Review 142   Potassium 3.1* 3.6 4.0  More results in Results Review 3.5   Chloride 100 103 106  More results in Results Review 107   CO2 28 27 27   More results in Results Review 24   BUN 30.0* 33.0* 40.0*  More results in Results Review 37.0*   Creatinine 4.5* 4.7* 5.1*  More results in Results Review 4.5*   Calcium 7.2* 7.4* 7.5*  More results in Results Review 7.5*   Albumin 1.5* 1.5* 1.6*  --   --    Phosphorus  --   --  4.2  --  3.5   Magnesium 1.4*  --   --   --   --    Glucose 409* 256* 87  More results in Results Review 125*   EGFR 11.9 11.3 10.3  More results in Results Review 11.9   More results in Results Review = values in this interval not displayed.        Recent Labs   Lab 05/12/19  1611   Urine Type Urine, Catheriz   Color, UA Yellow   Clarity, UA Sl Cloudy*   Specific Gravity UA 1.008   Urine pH 9.0*   Nitrite, UA Negative   Ketones UA Negative   Urobilinogen, UA 4.0*   Bilirubin, UA Negative   Blood, UA Large*   RBC, UA 26 - 50*   WBC, UA 26 - 50*           Signed by: Corky Mull, MD

## 2019-05-17 NOTE — Progress Notes (Addendum)
[facility]  PROGRESS NOTE      Patient: [name]  Date: 05/17/2019   LOS: 5 Days  Admission Date: 05/12/2019   MRN: [id number]  Attending: Ardeth Sportsman  Please contact me on epic chat       ASSESSMENT/PLAN     [name] is a 64 y.o. female with history of hypertension, hyperlipidemia, insulin-dependent diabetes mellitus, COPD, end-stage renal disease on peritoneal dialysis who presents to the emergency department for evaluation of a 3-day history of intractable nausea, vomiting and nonbloody diarrhea, admitted for viral gastroenteritis and UTI.    Patient Active Hospital Problem List:    #UGIB - Patient had 1 episode of 400 cc coffee ground emesis on 4/20 per RN  #Possible melena per patient report  -s/p EGD on 4/21: Grade D gastritis  -Continue Protonix 40 mg BID  -Hold ASA and Cilostazol  -Continue to monitor H/H  -GI recommendations appreciated. Outpatient follow-up for repeat EGD.    Chest pain  -EKG, troponin x2   -D dimer     #GNR Peritonitis, pansensitive   #Abdominal pain  #Nausea, vomiting, diarrhea - Resolved  #UTI  -Likely due to viral gastroenteritis/UTI/Peritonitis  -Peritoneal fluid with 51,000 WBC's. Follow-up fluid culture results  -Blood cultures NGTD, lactic acid normal and lipase normal  -Urine culture: >100,000 CFU/ML of multiple bacterial morphotypes   -CT abd/pelvis wo contrast reveals no evidence of bowel obstruction, bowel ischemia, perforation, diverticulitis, pancreatitis, nephrolithiasis and shows intraperitoneal fluid  -Continue patient's home percocet for moderate pain and IV morphine for severe pain  -PRN antiemetics for nausea/vomiting  -Continue IP Fortaz with PD exchanges  -ID recommendations appreciated    #Hypokalemia  #Hypomagnesemia  -K 3.1, potassium chloride 17meq once  -Mg 1.4, magnesium sulfate 1g every 1 hour x2    #Abdominal pain  -Likely due to above  -Pt also reports constipation x1 week. Will start BR     #ESRD on peritoneal dialysis  -Started  on dialysis on 05/2018  -CT abd/pelvis shows moderate amount of ascites ? Dialysis fluid  -Under care everywhere, noted that Cr from 10/2018-11/2018 has been 3.8-5 range  -Continue PD with IP antibiotics  -Repeat cell count per nephrology   -Nephrology following    #Hypertension   -Continue home amlodipine, coreg    #Hyperglycemia  #Type 2 DM  -Continue home lantus, SSI, ac/hs accuchecks  -HbA1c 9.8%    #Hyperlipidemia   -Continue home statin    #Iron deficiency anemia  -Continue IV Venofer - Day 2/3  -Continue to monitor H/H  -Hg 6.9, will order 1 unit pRBC transfusion     Nutrition  Diabetic, renal diet    DVT prophylaxis   SCD     Code Status: DNR    DISPO: Pending improvement of abdominal pain    Family Contact: Larene Beach - [phone]; Called and updated daughter on patient's condition and treatment plan.     SUBJECTIVE   Patient seen and examined at bedside.  Reports sob and chest pain when taking a deep breath.   Abdominal pain has improved per pt.   Denies nausea/vomiting     MEDICATIONS     Current Facility-Administered Medications   Medication Dose Route Frequency    amLODIPine  5 mg Oral Daily    atorvastatin  40 mg Oral Daily    carvedilol  25 mg Oral Q12H SCH    darbepoetin alfa  60 mcg Subcutaneous Weekly    famotidine  10 mg Oral Q12H  [facility]    insulin glargine  15 Units Subcutaneous QHS    insulin lispro  1-4 Units Subcutaneous QHS    insulin lispro  1-8 Units Subcutaneous TID AC    insulin lispro  3 Units Subcutaneous TID MEALS    lactulose  30 g Oral Q8H SCH    pantoprazole  40 mg Oral BID    sevelamer  800 mg Oral TID MEALS       PHYSICAL EXAM     Vitals:    05/17/19 1114   BP: 125/73   Pulse: 63   Resp: 16   Temp: 99.1 F (37.3 C)   SpO2: 98%       Temperature: Temp  Min: 98 F (36.7 C)  Max: 99.1 F (37.3 C)  Pulse: Pulse  Min: 63  Max: 67  Respiratory: Resp  Min: 16  Max: 18  Non-Invasive BP: BP  Min: 125/73  Max: 164/71  Pulse Oximetry SpO2  Min: 94 %  Max: 98 %    Intake and  Output Summary (Last 24 hours) at Date Time    Intake/Output Summary (Last 24 hours) at 05/17/2019 1124  Last data filed at 05/17/2019 0600  Gross per 24 hour   Intake 720.83 ml   Output 200 ml   Net 520.83 ml       GEN APPEARANCE: Normal;  A&OX3  HEENT: PERLA; EOMI; Conjunctiva Clear  NECK: Supple; No bruits  CVS: RRR, S1, S2; No M/G/R  LUNGS: CTAB; No Wheezes; No Rhonchi: No rales  ABD: Soft; diffuse abdominal tenderness with light touch; + Normoactive BS  EXT: No edema; Pulses 2+ and intact  Skin exam:  pink  NEURO: non focal, moves all extremities   CAP REFILL:  Normal  MENTAL STATUS: flat affect    LABS     Recent Labs   Lab 05/17/19  0936 05/16/19  0354 05/15/19  0551   WBC 9.39 6.53 4.48   RBC 3.21* 2.37* 2.48*   Hgb 9.3* 6.9* 7.2*   Hematocrit 30.7* 23.3* 24.0*   MCV 95.6 98.3* 96.8*   Platelets 145 134* 122*       Recent Labs   Lab 05/17/19  0939 05/16/19  0354 05/15/19  0957 05/14/19  0428 05/13/19  1132   Sodium 141 138 139 142 142   Potassium 3.0* 3.1* 3.6 4.0 3.5   Chloride 103 100 103 106 107   CO2 28 28 27 27 24    BUN 24.0* 30.0* 33.0* 40.0* 37.0*   Creatinine 4.3* 4.5* 4.7* 5.1* 4.5*   Glucose 164* 409* 256* 87 125*   Calcium 7.4* 7.2* 7.4* 7.5* 7.5*   Magnesium 1.7 1.4*  --   --   --        Recent Labs   Lab 05/17/19  0939 05/16/19  0354 05/15/19  0957 05/14/19  0428 05/14/19  0428   ALT  --  6 11  --  8   AST (SGOT)  --  5 4*  --  6   Bilirubin, Total  --  0.2 0.3  --  0.4   Albumin 1.4* 1.5* 1.5*  More results in Results Review 1.6*   Alkaline Phosphatase  --  95 91  --  89   More results in Results Review = values in this interval not displayed.             Microbiology Results     Procedure Component Value Units Date/Time    Blood Culture  Aerobic/Anaerobic #1 [[id number]] Collected: 05/12/19 1855    Specimen: Arm from Blood, Venipuncture Updated: 05/13/19 2221    Narrative:      ORDER#: [id number]                                    ORDERED BY: Dwyane Dee, VINOD  SOURCE: Blood, Venipuncture Arm                       COLLECTED:  05/12/19 18:55  ANTIBIOTICS AT COLL.:                                RECEIVED :  05/12/19 21:53  Culture Blood Aerobic and Anaerobic        PRELIM      05/13/19 22:21  05/13/19   No Growth after 1 day/s of incubation.      Blood Culture Aerobic/Anaerobic #2 [[id number]] Collected: 05/12/19 1855    Specimen: Arm from Blood, Venipuncture Updated: 05/13/19 2221    Narrative:      ORDER#: [id number]                                    ORDERED BY: Dwyane Dee, VINOD  SOURCE: Blood, Venipuncture Arm                      COLLECTED:  05/12/19 18:55  ANTIBIOTICS AT COLL.:                                RECEIVED :  05/12/19 21:53  Culture Blood Aerobic and Anaerobic        PRELIM      05/13/19 22:21  05/13/19   No Growth after 1 day/s of incubation.      COVID-19 (SARS-COV-2) [employer] [facility]) [[id number]] Collected: 05/12/19 1611    Specimen: Nasopharyngeal Swab from Nasopharynx Updated: 05/12/19 1648     Purpose of COVID testing Screening     SARS-CoV-2 Specimen Source Nasopharyngeal     SARS CoV 2 Overall Result Negative     Comment: Test performed using the Abbott ID NOW EUA assay.  Please see Fact Sheets for patients and providers located at:  http://olson-hall.info/  This test is for the qualitative detection of SARS-CoV-2  (COVID19) nucleic acid. Viral nucleic acids may persist in vivo,  independent of viability. Detection of viral nucleic acid does  not imply the presence of infectious virus, or that virus  nucleic acid is the cause of clinical symptoms. Negative  results should be treated as presumptive and, if inconsistent  with clinical signs and symptoms or necessary for patient  management, should be tested with an alternative molecular  assay. Negative results do not preclude SARS-CoV-2 infection  and should not be used as the sole basis for patient  management decisions. Invalid results may be due to inhibiting  substances in the specimen and recollection should occur.         Narrative:       o Collect and clearly label specimen type:  o Upper respiratory specimen: One Nasopharyngeal Dry Swab NO  Transport Media.  o Hand deliver to laboratory ASAP  Indication for testing->Extended care facility admission to  semi private room  Culture + Gram Stain,Aerobic, Body Fluid [[id number]] Collected: 05/13/19 1447    Specimen: Body Fluid from Ascites Fluid Updated: 05/13/19 2148    Narrative:      ORDER#: [id number]                                    ORDERED BY: Evonnie Dawes  SOURCE: Ascites Fluid ascites                        COLLECTED:  05/13/19 14:47  ANTIBIOTICS AT COLL.:                                RECEIVED :  05/13/19 19:33  Stain, Gram                                FINAL       05/13/19 21:48  05/13/19   Many WBCs             No organisms seen  Culture and Gram Stain, Aerobic, Body FluidPENDING      Urine culture [[id number]] Collected: 05/12/19 1611    Specimen: Bladder Urine Updated: 05/14/19 0909    Narrative:      Replace urinary catheter prior to obtaining the urine culture  if it has been in place for greater than or equal to 14  days:->N/A No Foley  Indications for U/A Reflex to Micro - Reflex to  Culture:->Suprapubic Pain/Tenderness or Dysuria  ORDER#: [id number]                                    ORDERED BY: HAILE, LYDIA  SOURCE: Urine                                        COLLECTED:  05/12/19 16:11  ANTIBIOTICS AT COLL.:                                RECEIVED :  05/12/19 16:26  Culture Urine                              FINAL       05/14/19 09:09  05/14/19   >100,000 CFU/ML of multiple bacterial morphotypes present.             Possible contamination, appropriate recollection is             requested if clinically indicated.            RADIOLOGY     No results found.    Keitha Butte Lanetra Hartley  11:24 AM 05/17/2019

## 2019-05-17 NOTE — Progress Notes (Signed)
CCPD completed w/ no flow issue; Total UF 190cc; Spec. For cell count collected and sent to lab.; PD cath dressing dry/ intact.

## 2019-05-17 NOTE — Progress Notes (Signed)
Unable to complete CT angiogram of chest due to patient being connected on peritoneal dialysis.

## 2019-05-17 NOTE — Progress Notes (Signed)
CCPD connected @ 1850. Pt AOX4, she complain of abdomen pain 7/10. PD exit site C/D/I, no S/S of infection noted.  Report given to the primary nurse.       05/17/19 1900   Initiating APD (Putting the Patient ON)    $APD Start Time 1850   Total Treatment Time Ordered (hours) 10 hours   Number of Cycles/Exchanges Ordered 5   Total Fill Volume (mL) 12500 mL   Fluid Orders for APD/CCPD   Cycler Bag #1 2.5 %, Low Calcium in 5000 mL   Cycler Bag #2 2.5 %, Low Calcium in 5000 mL   Cycler Bag #3 2.5 %, Low Calcium in 5000 mL   Mid-Day Manual Exchange    Is there a mid-day exchange? No   Peritoneal Dialysis Catheter   No Placement Date or Time found.   Present on Admission?: Yes  Catheter Type: Baxter   Site Assessment Clean;Dry;Intact   Dressing/Line Intervention Occlusive device   Status Accessed   Site Condition No complications   Dressing Status Clean;Dry;Intact   Dressing Occlusive

## 2019-05-17 NOTE — Plan of Care (Signed)
Patient AxOx4. Occasional confusion when first waking up from nap but easily reoriented. Patient received blood transfusion. Tolerated well/no reaction. Patient receiving peritoneal dialysis. Active bowel sounds x4 quadrants. Bowel movement x2. Abd soft/nontender. No complaints of pain. Patient is accucheck.Given coverage x1.

## 2019-05-17 NOTE — Plan of Care (Signed)
Problem: Renal Instability  Goal: Fluid and electrolyte balance are achieved/maintained  Outcome: Progressing  Flowsheets (Taken 05/17/2019 1837 by Kearney Hard, RN)  Fluid and electrolyte balance are achieved/maintained:   Monitor intake and output every shift   Monitor/assess lab values and report abnormal values   Assess for confusion/personality changes   Monitor daily weight   Provide adequate hydration  Goal: Free from infection  Outcome: Progressing  Flowsheets (Taken 05/16/2019 1901 by Lendell Caprice, RN)  Free from infection: Monitor/assess for signs and symptoms of infection     Problem: Patient Receiving Advanced Renal Therapies  Goal: Therapy access site remains intact  Outcome: Progressing  Flowsheets (Taken 05/16/2019 1901 by Lendell Caprice, RN)  Therapy access site remains intact:   Assess therapy access site   Change therapy access site dressing as needed

## 2019-05-17 NOTE — Plan of Care (Signed)
Alert and oriented X 4, medicated as ordered, no acute distress noted, remain afebrile. Denies SOB, on RA, complain chest pain, EKG normal, troponin X 2 negative. Noted with elevated D-dimer, CT Angiogram chest ordered to rule out PE. Patient was taken down for CT scan, complain that IV site was painful.  Patient is a hard stick.  Waiting new 20G IV insertion.  Tolerated meal, no complain of nausea or vomiting.Fall  and safety precautions maintained, educated to call for help when needed, call bell and belongings within reach.         Problem: Safety  Goal: Patient will be free from injury during hospitalization  Outcome: Progressing  Flowsheets (Taken 05/17/2019 1837)  Patient will be free from injury during hospitalization:   Assess patient's risk for falls and implement fall prevention plan of care per policy   Use appropriate transfer methods   Provide and maintain safe environment   Ensure appropriate safety devices are available at the bedside   Hourly rounding   Include patient/ family/ care giver in decisions related to safety  Goal: Patient will be free from infection during hospitalization  Outcome: Progressing  Flowsheets (Taken 05/17/2019 1837)  Free from Infection during hospitalization:   Assess and monitor for signs and symptoms of infection   Monitor all insertion sites (i.e. indwelling lines, tubes, urinary catheters, and drains)   Monitor lab/diagnostic results   Encourage patient and family to use good hand hygiene technique     Problem: Pain  Goal: Pain at adequate level as identified by patient  Outcome: Progressing  Flowsheets (Taken 05/17/2019 1837)  Pain at adequate level as identified by patient:   Identify patient comfort function goal   Assess for risk of opioid induced respiratory depression, including snoring/sleep apnea. Alert healthcare team of risk factors identified.   Assess pain on admission, during daily assessment and/or before any "as needed" intervention(s)   Reassess pain within  30-60 minutes of any procedure/intervention, per Pain Assessment, Intervention, Reassessment (AIR) Cycle     Problem: Compromised Tissue integrity  Goal: Damaged tissue is healing and protected  Outcome: Progressing  Flowsheets (Taken 05/17/2019 1837)  Damaged tissue is healing and protected:   Monitor/assess Braden scale every shift   Provide wound care per wound care algorithm   Reposition patient every 2 hours and as needed unless able to reposition self   Increase activity as tolerated/progressive mobility   Relieve pressure to bony prominences for patients at moderate and high risk

## 2019-05-18 ENCOUNTER — Inpatient Hospital Stay: Payer: Medicaid Other

## 2019-05-18 LAB — HEMOLYSIS INDEX: Hemolysis Index: 11 (ref 0–18)

## 2019-05-18 LAB — GLUCOSE WHOLE BLOOD - POCT
Whole Blood Glucose POCT: 249 mg/dL — ABNORMAL HIGH (ref 70–100)
Whole Blood Glucose POCT: 139 mg/dL — ABNORMAL HIGH (ref 70–100)
Whole Blood Glucose POCT: 144 mg/dL — ABNORMAL HIGH (ref 70–100)
Whole Blood Glucose POCT: 301 mg/dL — ABNORMAL HIGH (ref 70–100)

## 2019-05-18 LAB — RENAL FUNCTION PANEL
Albumin: 1.4 g/dL — ABNORMAL LOW (ref 3.5–5.0)
Anion Gap: 6 (ref 5.0–15.0)
BUN: 25 mg/dL — ABNORMAL HIGH (ref 7.0–19.0)
CO2: 32 mEq/L — ABNORMAL HIGH (ref 22–29)
Calcium: 7.8 mg/dL — ABNORMAL LOW (ref 8.5–10.5)
Chloride: 102 mEq/L (ref 100–111)
Creatinine: 4.4 mg/dL — ABNORMAL HIGH (ref 0.6–1.0)
Glucose: 255 mg/dL — ABNORMAL HIGH (ref 70–100)
Phosphorus: 3.6 mg/dL (ref 2.3–4.7)
Potassium: 4.2 mEq/L (ref 3.5–5.1)
Sodium: 140 mEq/L (ref 136–145)

## 2019-05-18 LAB — GFR: EGFR: 12.2

## 2019-05-18 MED ORDER — AMLODIPINE BESYLATE 5 MG PO TABS
5.0000 mg | ORAL_TABLET | Freq: Two times a day (BID) | ORAL | Status: DC
Start: 2019-05-18 — End: 2019-05-26
  Administered 2019-05-18 – 2019-05-26 (×17): 5 mg via ORAL
  Filled 2019-05-18 (×17): qty 1

## 2019-05-18 MED ORDER — POTASSIUM CHLORIDE CRYS ER 10 MEQ PO TBCR
40.00 meq | EXTENDED_RELEASE_TABLET | Freq: Two times a day (BID) | ORAL | Status: DC
Start: 2019-05-18 — End: 2019-05-21
  Administered 2019-05-18 – 2019-05-21 (×7): 40 meq via ORAL
  Filled 2019-05-18 (×7): qty 4

## 2019-05-18 MED ORDER — IODIXANOL 320 MG/ML IV SOLN
100.00 mL | Freq: Once | INTRAVENOUS | Status: AC | PRN
Start: 2019-05-18 — End: 2019-05-18
  Administered 2019-05-18: 100 mL via INTRAVENOUS

## 2019-05-18 NOTE — Plan of Care (Signed)
Pt AOx4. VS WDL. Lungs diminished on RA. Cardiac NSR on 24HR TELE. Patient c/o CP, 9/10 PRN morphine x1, pt fell asleep.  No acute distress noted. New IV place 20G to [facility]. Pt needs CT done in AM before order expires in afternoon, Per CT tech. Refused lactulose. PD ongoing all night, Site clean and intact. Purposeful hourly rounding done. Safety bundle in place, call light and personal belongings within reach. continue to monitor.     Problem: Pain  Goal: Pain at adequate level as identified by patient  Outcome: Progressing  Flowsheets (Taken 05/17/2019 1837 by Kearney Hard, RN)  Pain at adequate level as identified by patient:   Identify patient comfort function goal   Assess for risk of opioid induced respiratory depression, including snoring/sleep apnea. Alert healthcare team of risk factors identified.   Assess pain on admission, during daily assessment and/or before any "as needed" intervention(s)   Reassess pain within 30-60 minutes of any procedure/intervention, per Pain Assessment, Intervention, Reassessment (AIR) Cycle     Problem: Renal Instability  Goal: Fluid and electrolyte balance are achieved/maintained  Outcome: Progressing  Flowsheets (Taken 05/18/2019 0657)  Fluid and electrolyte balance are achieved/maintained:   Monitor intake and output every shift   Monitor/assess lab values and report abnormal values   Provide adequate hydration   Assess for confusion/personality changes   Monitor daily weight   Assess and reassess fluid and electrolyte status   Observe for seizure activity and initiate seizure precautions if indicated   Observe for cardiac arrhythmias   Monitor for muscle weakness   Follow fluid restrictions/IV/PO parameters  Goal: Free from infection  Outcome: Progressing  Flowsheets (Taken 05/18/2019 0657)  Free from infection:   Monitor/assess for signs and symptoms of infection   Assess need for indwelling catheter every shift and discuss with LIP     Problem: Patient Receiving Advanced  Renal Therapies  Goal: Therapy access site remains intact  Outcome: Progressing  Flowsheets (Taken 05/18/2019 0657)  Therapy access site remains intact: Assess therapy access site

## 2019-05-18 NOTE — Progress Notes (Signed)
CCPD started @1845H . PD cath w/ good flow. Dressiing intact. Report given to Pixie Casino RN.     05/18/19 1857   Initiating APD (Putting the Patient ON)    $APD Start Time 1845   Total Treatment Time Ordered (hours) 10 hours   Number of Cycles/Exchanges Ordered 5   Total Fill Volume (mL) 12500 mL   Fluid Orders for APD/CCPD   Cycler Bag #1 2.5 %, Low Calcium in 5000 mL   Cycler Bag #2 2.5 %, Low Calcium in 5000 mL   Cycler Bag #3 2.5 %, Low Calcium in 5000 mL   Mid-Day Manual Exchange    Is there a mid-day exchange? No   Peritoneal Dialysis Catheter   No Placement Date or Time found.   Present on Admission?: Yes  Catheter Type: Baxter   Site Assessment Clean;Dry;Intact   Status Accessed   Site Condition No complications   Dressing Status Clean;Dry;Intact   Dressing Occlusive

## 2019-05-18 NOTE — Plan of Care (Signed)
Problem: Renal Instability  Goal: Fluid and electrolyte balance are achieved/maintained  Flowsheets (Taken 05/18/2019 1900)  Fluid and electrolyte balance are achieved/maintained:   Monitor/assess lab values and report abnormal values   Assess and reassess fluid and electrolyte status   Monitor daily weight  Goal: Nutritional intake is adequate  Flowsheets (Taken 05/18/2019 1900)  Nutritional intake is adequate:   Monitor daily weights   Assist patient with meals/food selection   Allow adequate time for meals   Assess anorexia, appetite, and amount of meal/food tolerated   Consult/collaborate with Clinical Nutritionist  Goal: Free from infection  Flowsheets (Taken 05/18/2019 1900)  Free from infection: Monitor/assess for signs and symptoms of infection     Problem: Patient Receiving Advanced Renal Therapies  Goal: Therapy access site remains intact  Flowsheets (Taken 05/18/2019 1900)  Therapy access site remains intact:   Assess therapy access site   Change therapy access site dressing as needed

## 2019-05-18 NOTE — Plan of Care (Signed)
Discharge Recommendation: SNF  DME Recommended for Discharge: Front wheel walker, BSC, Wheelchair-manual(if [facility] home )    Is an Occupational Therapy Evaluation Indicated at this time? This patient is already on OT caseload.     Treatment/Interventions: Personnel officer, Exercise, Neuromuscular re-education, Functional transfer training, LE strengthening/ROM, Endurance training, Patient/family training, Equipment eval/education, Bed mobility, Compensatory technique education  PT Frequency: 2-3x/wk     PMP Activity: Step 6 - Walks in Room  Distance Walked (ft) (Step 6,7): 20 Feet  (Please See Therapy Evaluation for device and assistance level needed)    Goals:   Goals  Goal Formulation: With patient  Time for Goal Acheivement: 3 visits  Goals: Select goal  Pt Will Go Supine To Sit: with stand by assist, to maximize functional mobility and independence  Pt Will Perform Sit to Stand: with stand by assist, to maximize functional mobility and independence  Pt Will Demo Standing Balance: 4/5 indep, moves/returns center of grav 1-2"/1 plane, to maximize functional mobility and independence  Pt Will Transfer Bed/Chair: to maximize functional mobility and independence, with supervision  Pt Will Ambulate: 51-100 feet, with supervision, to maximize functional mobility and independence  Pt Will Perform Home Exer Program: with stand by assist, to maximize functional mobility and independence

## 2019-05-18 NOTE — Progress Notes (Signed)
INFECTIOUS DISEASES PROGRESS NOTE    Date Time: 05/18/19 3:28 PM  Patient Name: [name],[name]  Patient status.Inpatient  Hospital Day: 6    Assessment and Plan:   1. PD Cath peritonitis/ Ecoli (S)to cefazolin  WBC fluid [phone]    PLAN: cefazolin  If abd pain persists may need to consider pulling cath.  Subjective:   Non toxic    Review of Systems:   Review of Systems - Gastrointestinal ROS: positive for - abdominal pain  negative for - diarrhea or nausea/vomiting    Antibiotics:   Day 5    Other medications reviewed in EPIC.  Central Access:       Physical Exam:     Vitals:    05/18/19 1209   BP: 151/77   Pulse: 61   Resp: 16   Temp: 98.1 F (36.7 C)   SpO2: 98%       Abdomen - tenderness noted diffusely but especially around cath. BS present    Labs:     Microbiology Results     Procedure Component Value Units Date/Time    Blood Culture Aerobic/Anaerobic #1 [[id number]] Collected: 05/12/19 1855    Specimen: Arm from Blood, Venipuncture Updated: 05/15/19 2221    Narrative:      ORDER#: [id number]                                    ORDERED BY: Dwyane Dee, VINOD  SOURCE: Blood, Venipuncture Arm                      COLLECTED:  05/12/19 18:55  ANTIBIOTICS AT COLL.:                                RECEIVED :  05/12/19 21:53  Culture Blood Aerobic and Anaerobic        PRELIM      05/15/19 22:21  05/13/19   No Growth after 1 day/s of incubation.  05/14/19   No Growth after 2 day/s of incubation.  05/15/19   No Growth after 3 day/s of incubation.      Blood Culture Aerobic/Anaerobic #2 [[id number]] Collected: 05/12/19 1855    Specimen: Arm from Blood, Venipuncture Updated: 05/15/19 2221    Narrative:      ORDER#: [id number]                                    ORDERED BY: Dwyane Dee, VINOD  SOURCE: Blood, Venipuncture Arm                      COLLECTED:  05/12/19 18:55  ANTIBIOTICS AT COLL.:                                RECEIVED :  05/12/19 21:53  Culture Blood Aerobic and Anaerobic        PRELIM      05/15/19 22:21  05/13/19    No Growth after 1 day/s of incubation.  05/14/19   No Growth after 2 day/s of incubation.  05/15/19   No Growth after 3 day/s of incubation.      COVID-19 (SARS-COV-2) [facility]) [[id number]] Collected: 05/12/19 1611    Specimen: Nasopharyngeal  Swab from Nasopharynx Updated: 05/12/19 1648     Purpose of COVID testing Screening     SARS-CoV-2 Specimen Source Nasopharyngeal     SARS CoV 2 Overall Result Negative     Comment: Test performed using the Abbott ID NOW EUA assay.  Please see Fact Sheets for patients and providers located at:  http://olson-hall.info/  This test is for the qualitative detection of SARS-CoV-2  (COVID19) nucleic acid. Viral nucleic acids may persist in vivo,  independent of viability. Detection of viral nucleic acid does  not imply the presence of infectious virus, or that virus  nucleic acid is the cause of clinical symptoms. Negative  results should be treated as presumptive and, if inconsistent  with clinical signs and symptoms or necessary for patient  management, should be tested with an alternative molecular  assay. Negative results do not preclude SARS-CoV-2 infection  and should not be used as the sole basis for patient  management decisions. Invalid results may be due to inhibiting  substances in the specimen and recollection should occur.         Narrative:      o Collect and clearly label specimen type:  o Upper respiratory specimen: One Nasopharyngeal Dry Swab NO  Transport Media.  o Hand deliver to laboratory ASAP  Indication for testing->Extended care facility admission to  semi private room    Culture + Gram Lenise [name], Body Fluid [[id number]] Collected: 05/13/19 1447    Specimen: Body Fluid from Ascites Fluid Updated: 05/15/19 1818    Narrative:      [id number] called Micro Results of fluid. Results read back [id number], by [id number]  on 05/15/2019 at 18:17  ORDER#: [id number]                                    ORDERED BY: RANA, IRMINDRA  SOURCE: Ascites Fluid ascites                         COLLECTED:  05/13/19 14:47  ANTIBIOTICS AT COLL.:                                RECEIVED :  05/13/19 19:33  [id number]_ called Micro Results of fluid. Results read back [id number], by [id number] on 05/15/2019 at 18:17  Stain, Gram                                FINAL       05/13/19 21:48  05/13/19   Many WBCs             No organisms seen  Culture and Gram Stain, Aerobic, Body FluidFINAL       05/15/19 18:18   +  05/15/19   Very light growth of Escherichia coli      _____________________________________________________________________________                                     E.coli       ANTIBIOTICS                     MIC  INTRP      _____________________________________________________________________________  Amoxicillin/CA  8/4    S        Ampicillin                      >16    R        Ampicillin/sulbactam           16/8    I        Aztreonam                       <=2    S        Cefazolin                        2     S        Cefepime                        <=1    S        Cefoxitin                       <=4    S        Ceftazidime                     <=2    S        Ceftriaxone                     <=1    S        Cefuroxime                      <=4    S        Ciprofloxacin                 <=0.25   S        Ertapenem                     <=0.25   S        Gentamicin                      <=2    S        Levofloxacin                   <=0.5   S        Meropenem                      <=0.5   S        Piperacillin/Tazobactam        <=2/4   S        Tetracycline                    >8     R        Trimethoprim/Sulfamethoxazole  >2/38   R        _____________________________________________________________________________            S=SUSCEPTIBLE     I=INTERMEDIATE     R=RESISTANT                            N/S=NON-SUSCEPTIBLE  _____________________________________________________________________________      Urine culture [[id number]] Collected: 05/12/19 1611  Specimen: Bladder Urine  Updated: 05/14/19 0909    Narrative:      Replace urinary catheter prior to obtaining the urine culture  if it has been in place for greater than or equal to 14  days:->N/A No Foley  Indications for U/A Reflex to Micro - Reflex to  Culture:->Suprapubic Pain/Tenderness or Dysuria  ORDER#: [id number]                                    ORDERED BY: HAILE, LYDIA  SOURCE: Urine                                        COLLECTED:  05/12/19 16:11  ANTIBIOTICS AT COLL.:                                RECEIVED :  05/12/19 16:26  Culture Urine                              FINAL       05/14/19 09:09  05/14/19   >100,000 CFU/ML of multiple bacterial morphotypes present.             Possible contamination, appropriate recollection is             requested if clinically indicated.            CBC w/Diff CMP   Recent Labs   Lab 05/17/19  0936 05/16/19  0354 05/15/19  0551   WBC 9.39 6.53 4.48   Hgb 9.3* 6.9* 7.2*   Hematocrit 30.7* 23.3* 24.0*   Platelets 145 134* 122*   MCV 95.6 98.3* 96.8*   Neutrophils 76.7  --   --    Segmented Neutrophils  --  78 74       PT/INR         Recent Labs   Lab 05/18/19  0844 05/17/19  1337 05/17/19  0939 05/16/19  0354 05/15/19  0957 05/15/19  0957 05/14/19  0428 05/14/19  0428   Sodium 140  --  141 138  More results in Results Review 139  More results in Results Review 142   Potassium 4.2  --  3.0* 3.1*  More results in Results Review 3.6  More results in Results Review 4.0   Chloride 102  --  103 100  More results in Results Review 103  More results in Results Review 106   CO2 32*  --  28 28  More results in Results Review 27  More results in Results Review 27   BUN 25.0*  --  24.0* 30.0*  More results in Results Review 33.0*  More results in Results Review 40.0*   Creatinine 4.4*  --  4.3* 4.5*  More results in Results Review 4.7*  More results in Results Review 5.1*   Glucose 255*  --  164* 409*  More results in Results Review 256*  More results in Results Review 87   Calcium 7.8*  --  7.4* 7.2*   More results in Results Review 7.4*  More results in Results Review 7.5*   Magnesium  --  1.8 1.7 1.4*  --   --   --   --  Phosphorus 3.6  --  3.8  --   --   --   --  4.2   Protein, Total  --   --   --  4.6*  --  4.2*  --  4.4*   Albumin 1.4*  --  1.4* 1.5*  More results in Results Review 1.5*  More results in Results Review 1.6*   AST (SGOT)  --   --   --  5  --  4*  --  6   ALT  --   --   --  6  --  11  --  8   Alkaline Phosphatase  --   --   --  95  --  91  --  89   Bilirubin, Total  --   --   --  0.2  --  0.3  --  0.4   More results in Results Review = values in this interval not displayed.      Glucose POCT   Recent Labs   Lab 05/18/19  0844 05/17/19  0939 05/16/19  0354 05/15/19  0957 05/14/19  0428 05/13/19  1132 05/12/19  1455   Glucose 255* 164* 409* 256* 87 125* 551*          Rads:     Radiology Results (24 Hour)     Procedure Component Value Units Date/Time    CT Angiogram Chest [[id number]] Collected: 05/18/19 0952    Order Status: Completed Updated: 05/18/19 1000    Narrative:      INDICATION: PE suspected, intermediate prob, positive D-dimer    TECHNIQUE: CT was performed through the chest after intravenous  administration of 100 mL Visipaque 320 without adverse event.   Multiplanar reconstructions and 3D post processing were performed.  A  combination of automatic exposure control and adjustment  of the mA  and/or kV according to patient size was utilized.    COMPARISON: None.    FINDINGS:    CT ANGIOGRAM: The study is technically adequate. There are no filling  defects or vascular cut-off to indicate pulmonary embolus.     CHEST: Coronary artery disease is present. There is a complex hiatal  hernia measuring 7 x 6.2 x 4 cm. Shotty mediastinal, axillary and hilar  lymph nodes more prominent for number than size and not enlarged by  criteria. There are small bilateral pleural effusions present. A cyst  within the left lower lobe subpleural measures 2.2 cm diameter. There is  mild dependent  subsegmental atelectasis within the lower lobes  bilaterally. No endobronchial lesions are demonstrated. No suspicious  lytic or blastic lesions demonstrated.    LIMITED ABDOMEN/PELVIS: Limited coverage of the upper abdomen  demonstrates no abnormality in the visualized portions of the liver and  spleen. Trace perihepatic ascites is present.      Impression:         No evidence of pulmonary embolism.    Small bilateral pleural effusions.    Large hiatal hernia.    Trace perihepatic ascites.    Other nonurgent findings as above.    Lonia Skinner, MD   05/18/2019 9:58 AM            Signed by: Ernst Breach

## 2019-05-18 NOTE — Progress Notes (Signed)
[facility] PROGRESS NOTE  A[place] Ed[place], x [phone] [facility] Spectralink)      Date Time: 05/18/19 7:29 AM  Patient Name: [name]  Attending Physician: Ardeth Sportsman, MD    CC: follow-up ESRD  PD note    Assessment:     1. PD associated peritonitis - E Coli   - cell count improving  2. ESRD- on CAPD  3. Anemia- CKD + Fe deficiency now exacerbated by GI bleed  4. Volume overload  5. Hypokalemia  6. Hypomagnesemia - resolved        Recommendations:   1. Labs ordered  2. Start standing KCL 40 meq bid  3. Increase Norvasc  4. Cont IP cefazolin    Overall stable from renal standpoint - pt can continue abx as OP      Case discussed with:  Pt    Corky Mull, MD   [facility]  [phone] (office)  X [phone] (FF[facility] Spectra-Link)      Subjective:   No BM- mild abd pain    Review of Systems:   Cardio: no chest pain, no edema  Pulm: no SOB  Abd: + tenderness in the RLQ      Physical Exam:     Vitals:    05/17/19 2003 05/17/19 2216 05/17/19 2321 05/18/19 0413   BP: 153/84 170/81 158/83 147/85   Pulse: 67 65 65 69   Resp: 17  17 16    Temp: 97.5 F (36.4 C)  97.9 F (36.6 C) 97.9 F (36.6 C)   TempSrc: Oral  Oral Oral   SpO2: 96%  98% 97%   Weight:    122.2 kg (269 lb 6.4 oz)   Height:           Intake and Output Summary (Last 24 hours) at Date Time    Intake/Output Summary (Last 24 hours) at 05/18/2019 0729  Last data filed at 05/17/2019 1800  Gross per 24 hour   Intake 840 ml   Output --   Net 840 ml       General: obese female, awake, alert, oriented x 3, no acute distress.  Cardiovascular: regular rate and rhythm  Lungs: clear to auscultation bilaterally, bilateral air entry,normal work of breathing  Abdomen: soft, RLQ tenderness to light palpation - pain improved on the left side, non-distended, LLQ PD catheter  Extremities: +edema      Meds:      Scheduled Meds: PRN Meds:    amLODIPine, 5 mg, Oral, BID  atorvastatin, 40 mg, Oral, Daily  carvedilol, 25 mg, Oral, Q12H SCH  darbepoetin alfa,  60 mcg, Subcutaneous, Weekly  peritoneal dialysis with additives, , Peritoneal catheter, Daily  famotidine, 10 mg, Oral, Q12H SCH  insulin glargine, 15 Units, Subcutaneous, QHS  insulin lispro, 1-4 Units, Subcutaneous, QHS  insulin lispro, 1-8 Units, Subcutaneous, TID AC  insulin lispro, 3 Units, Subcutaneous, TID MEALS  lactulose, 30 g, Oral, Q8H SCH  pantoprazole, 40 mg, Oral, BID  potassium chloride, 40 mEq, Oral, BID  sevelamer, 800 mg, Oral, TID MEALS          Continuous Infusions:   sodium chloride 100 mL/hr at 05/14/19 1828    lactated ringers Stopped (05/15/19 1409)    sodium chloride, , PRN  dextrose, 15 g of glucose, PRN   And  dextrose, 12.5 g, PRN   And  glucagon (rDNA), 1 mg, PRN  morphine, 2 mg, Q4H PRN  naloxone, 0.2 mg, PRN  ondansetron, 4 mg, Q6H PRN  Or  ondansetron, 4 mg, Q6H PRN  oxyCODONE-acetaminophen, 1 tablet, Q4H PRN              Labs:     Recent Labs   Lab 05/17/19  0936 05/16/19  0354 05/15/19  0551   WBC 9.39 6.53 4.48   Hgb 9.3* 6.9* 7.2*   Hematocrit 30.7* 23.3* 24.0*   Platelets 145 134* 122*     Recent Labs   Lab 05/17/19  1337 05/17/19  0939 05/16/19  0354 05/15/19  0957 05/14/19  0428 05/14/19  0428 05/13/19  1132 05/13/19  1132   Sodium  --  141 138 139  More results in Results Review 142  More results in Results Review 142   Potassium  --  3.0* 3.1* 3.6  More results in Results Review 4.0  More results in Results Review 3.5   Chloride  --  103 100 103  More results in Results Review 106  More results in Results Review 107   CO2  --  28 28 27   More results in Results Review 27  More results in Results Review 24   BUN  --  24.0* 30.0* 33.0*  More results in Results Review 40.0*  More results in Results Review 37.0*   Creatinine  --  4.3* 4.5* 4.7*  More results in Results Review 5.1*  More results in Results Review 4.5*   Calcium  --  7.4* 7.2* 7.4*  More results in Results Review 7.5*  More results in Results Review 7.5*   Albumin  --  1.4* 1.5* 1.5*  More results in Results  Review 1.6*  --   --    Phosphorus  --  3.8  --   --   --  4.2  --  3.5   Magnesium 1.8 1.7 1.4*  --   --   --   --   --    Glucose  --  164* 409* 256*  More results in Results Review 87  More results in Results Review 125*   EGFR  --  12.5 11.9 11.3  More results in Results Review 10.3  More results in Results Review 11.9   More results in Results Review = values in this interval not displayed.       Recent Labs   Lab 05/12/19  1611   Urine Type Urine, Catheriz   Color, UA Yellow   Clarity, UA Sl Cloudy*   Specific Gravity UA 1.008   Urine pH 9.0*   Nitrite, UA Negative   Ketones UA Negative   Urobilinogen, UA 4.0*   Bilirubin, UA Negative   Blood, UA Large*   RBC, UA 26 - 50*   WBC, UA 26 - 50*           Signed by: Corky Mull, MD

## 2019-05-18 NOTE — OT Eval Note (Signed)
[facility]  Occupational Therapy Evaluation and Treatment    Patient: [name]  MRN#: [id number]   Unit: College  Bed: A2315/A2315.01    Time of Evaluation and Treatment:  Time Calculation  OT Received On: 05/18/19  Start Time: 1208  Stop Time: 1233  Time Calculation (min): 25 min    Chart Review and Collaboration with Care Team: 6 minutes, not included in above time.    Evaluation: 12 minutes  Treatment:  13 minutes    OT Visit Number: 1    Consult received for [name] for OT Evaluation and Treatment.  Patients medical condition is appropriate for Occupational therapy intervention at this time.    Activity Orders:  OT eval and treat    Precautions and Contraindications:  Precautions  Other Precautions: reporting new onset L Knee pain     Personal Protective Equipment (PPE)  gloves and procedure mask    Medical Diagnosis:  AKI (acute kidney injury) [N17.9]    History of Present Illness:  [name] is a 63 y.o. female admitted on 05/12/2019 with 3-day history of intractable nausea, vomiting and nonbloody diarrhea, admitted for viral gastroenteritis and UTI.       Patient Active Problem List   Diagnosis    AKI (acute kidney injury)    Iron deficiency anemia secondary to inadequate dietary iron intake    Sideropenic dysphagia        Past Medical/Surgical History:  Past Medical History:   Diagnosis Date    Chronic obstructive pulmonary disease     Diabetes mellitus     Hyperlipidemia     Hypertension     Sleep apnea 2018     Past Surgical History:   Procedure Laterality Date    EGD, BIOPSY N/A 05/14/2019    Procedure: EGD, BIOPSY;  Surgeon: Ronie Spies, MD;  Location: [facility];  Service: Gastroenterology;  Laterality: N/A;    JOINT REPLACEMENT      shoulder    JOINT REPLACEMENT      knee    OVARY SURGERY Left     Removal        X-Rays/Tests/Labs  Lab Results   Component Value Date/Time    HGB 9.3 (L) 05/17/2019 09:36 AM    HCT 30.7  (L) 05/17/2019 09:36 AM    K 4.2 05/18/2019 08:44 AM    NA 140 05/18/2019 08:44 AM    TROPI 0.04 05/17/2019 06:20 PM    TROPI 0.05 05/17/2019 01:37 PM       All imaging reviewed. Please see chart for details      Social History:  Prior Level of Function  Prior level of function: Independent with ADLs, Ambulates with assistive device  Assistive Device: Single point cane  Baseline Activity Level: Community ambulation  Driving: does not drive(daughter in [place] helps with grocery shopping )  Dressing - Upper Body: independent  Dressing - Lower Body: independent  Cooking: Yes  Feeding: independent  Bathing: independent  Grooming: independent  Toileting: independent    Home Living Arrangements  Living Arrangements: Alone  Type of Home: Apartment  Home Layout: One level, Elevator  Bathroom Shower/Tub: Engineer, materials: Grab bars in [place] - Notes / Comments: Patient reports recently moving into apartment, Patient also reports not walking for ~3 weeks (besides from sofa to room for PD) States she wasn't using toilet but was using adult incontinence briefs instead.  Subjective:  Subjective: "I haven't walked in 3 weeks."   Patient is agreeable to participation in the therapy session. Nursing clears patient for therapy.   Patient Goal: Get better before going back to apartment  Pain Assessment  Pain Assessment: (c/o unrated pain in L knee-RN notified )      Objective:      Observation of Patient/Vital Signs: VSS (patient with some c/o dizziness with activity but stable t/o)    Cognitive Status and Neuro Exam:  Cognition/Neuro Status  Arousal/Alertness: Appropriate responses to stimuli  Attention Span: Appears intact  Orientation Level: Oriented X4  Memory: Appears intact  Following Commands: minimal verbal instruction  Safety Awareness: minimal verbal instruction  Behavior: calm, attentive, cooperative  Motor Planning: intact  Coordination: Kenneth impaired  Hand Dominance: right  handed    Musculoskeletal Examination  Gross ROM  Right Upper Extremity ROM: (grossly WFL)  Left Upper Extremity ROM: (grossly WFL)  Gross Strength  Right Upper Extremity Strength: 3+/5(grossly)  Left Upper Extremity Strength: 3+/5(grossly)       Sensory/Oculomotor Examination  Sensory  Auditory: intact  Tactile - Light Touch: intact         Activities of Daily Living  Self-care and Home Management  Eating: Modified Independent, in bed  Grooming: Stand by Assist, in chair  Bathing: Moderate Assist  UB Dressing: Stand by Assist  LB Dressing: Moderate Assist(simulated )  Toileting: Moderate Assist, perineal hygiene  Functional Transfers: Minimal Assist    Functional Mobility:  Mobility and Transfers  Supine to Sit: Minimal Assist  Sit to Stand: Minimal Assist  Bed to Chair: Contact Guard Assist  Functional Mobility/Ambulation: Contact Guard Assist(SPT/ ~3 steps to bedside chair )     Balance  Balance  Static Sitting Balance: Supervision    Participation and Activity Tolerance  Participation and Endurance  Participation Effort: good    Educated the patient to role of occupational therapy, plan of care, goals of therapy and HEP, safety with mobility and ADLs with verbalized understanding.    Patient left in bedside chair  with all medical equipment in place and call bell and all personal items/needs within reach. RN notified of session outcome.      Assessment:  [name] is a 64 y.o. female admitted 05/12/2019. Patient would benefit from continued skilled OT to address deficits listed below and increase functional independence.     OT Assessment: decreased strength;balance deficits;decreased independence with ADLs;decreased independence with IADLs;decreased endurance/activity tolerance      Treatment:  Patient received incontinent of urine, sat EOB and assisted with gown change and pericare. Transferred to bedside chair with CGA (~3 steps with RW). Discussed D/C planning, patient agreeable to SNF at this time,  has concerns about being able to care for herself alone at home, increased weakness since she hasn't been "walking" for ~3 weeks.   Patient left in bedside chair for bed linens to be changed.     Rehabilitation Potential: Prognosis: Good, With continued OT s/p acute discharge    Plan:  OT Frequency Recommended: 2-3x/wk   Treatment Interventions: ADL retraining, Functional transfer training, Patient/Family training, Equipment eval/education, Compensatory technique education, Continued evaluation     PMP Activity: Step 6 - Walks in Room  Distance Walked (ft) (Step 6,7): 3 Feet    Risks/benefits/POC discussed    Goals:  Time For Goal Achievement: 5 visits  ADL Goals  Patient will groom self: Supervision, at sinkside  Patient will dress lower body: Stand by Assist, with AE  Patient will toilet: Supervision  Mobility and Transfer Goals  Pt will perform functional transfers: Supervision, with rolling walker               DME Recommended for Discharge: No additional equipment/DME recommended at this time  Discharge Recommendation: SNF      Ranae Pila, OTR/L #[id number]  05/18/2019

## 2019-05-18 NOTE — Plan of Care (Signed)
Discharge Recommendation: SNF   DME Recommended for Discharge: No additional equipment/DME recommended at this time    OT Frequency Recommended: 2-3x/wk     Is PT evaluation indicated at this time? Yes, a PT evaluation is indicated at this time.    PMP Activity: Step 6 - Walks in Room  Distance Walked (ft) (Step 6,7): 3 Feet  (Please See Therapy Evaluation for device and assistance level needed)    Treatment Interventions: ADL retraining, Functional transfer training, Patient/Family training, Equipment eval/education, Compensatory technique education, Continued evaluation       Goal Formulation: Patient  Time For Goal Achievement: 5 visits  ADL Goals  Patient will groom self: Supervision, at sinkside  Patient will dress lower body: Stand by Assist, with AE  Patient will toilet: Supervision  Mobility and Transfer Goals  Pt will perform functional transfers: Supervision, with rolling walker

## 2019-05-18 NOTE — Progress Notes (Signed)
CCPD completed; Tot UF 396 cc.; Dressing intact; No complaint of discomfort dialysiswise. Report given to Pixie Casino RN.     05/18/19 0900   Completing APD/CCPD (Taking the Patient OFF)    Effluent Appearance Clear   Specimen Collected No   Volume Collected in Drainage Bag (mL) 396 mL   Peritoneal Dialysis Comments CCPD completed   Peritoneal Dialysis Catheter   No Placement Date or Time found.   Present on Admission?: Yes  Catheter Type: Baxter   Site Assessment Clean;Dry;Intact   Status Clamped   Site Condition No complications   Dressing Status Clean;Dry;Intact   Dressing Occlusive

## 2019-05-18 NOTE — PT Eval Note (Signed)
[facility]  Physical Therapy Attempt Note    Patient:  [name] MRN#:  [id number]  Unit:  [place] Room/Bed:  A2315/A2315.01    Physical Therapy evaluation attempted on 05/18/2019 at 2:03 PM; unable to complete secondary to pt just returned back to bed with nursing assist, being assisted with cares at present. PT to re-attempt later this date. RN aware.    [place] Crane, [place]  [phone]  05/18/2019 2:04 PM

## 2019-05-18 NOTE — PT Eval Note (Signed)
[facility]  Physical Therapy Evaluation and Treatment    Patient: [name]  MRN#: [id number]  Unit: [place]  Bed: A2315/A2315.01    Time of Evaluation and Treatment:  Time Calculation  PT Received On: 05/18/19  Start Time: 1645  Stop Time: 1730  Time Calculation (min): 45 min    Evaluation Time: 12 minutes  Treatment Time: 30 minutes    Chart Review and Collaboration with Care Team: 6 minutes, not included in above time    PT Visit Number: 1    Consult received for [name] for PT Evaluation and Treatment.  Patients medical condition is appropriate for Physical therapy intervention at this time.    Activity Orders:  PT eval and treat    Precautions and Contraindications:  Precautions  Weight Bearing Status: no restrictions  Other Precautions: reporting new onset L Knee pain     Personal Protective Equipment (PPE)  gloves, procedure mask, procedure mask with face shield, eye shield/covering, surgical / bouffant cap and shoe covers    Medical Diagnosis:  AKI (acute kidney injury) [N17.9]    History of Present Illness:  [name] is a 64 y.o. female admitted on 05/12/2019 with 3-day history of intractable nausea,vomiting and nonbloody diarrhea, admitted for viral gastroenteritis and UTI.     Patient Active Problem List   Diagnosis    AKI (acute kidney injury)    Iron deficiency anemia secondary to inadequate dietary iron intake    Sideropenic dysphagia       Past Medical/Surgical History:  Past Medical History:   Diagnosis Date    Chronic obstructive pulmonary disease     Diabetes mellitus     Hyperlipidemia     Hypertension     Sleep apnea 2018     Past Surgical History:   Procedure Laterality Date    EGD, BIOPSY N/A 05/14/2019    Procedure: EGD, BIOPSY;  Surgeon: Ronie Spies, MD;  Location: [facility];  Service: Gastroenterology;  Laterality: N/A;    JOINT REPLACEMENT      shoulder    JOINT REPLACEMENT      knee    OVARY SURGERY Left      Removal       X-Rays/Tests/Labs:  Lab Results   Component Value Date/Time    HGB 9.3 (L) 05/17/2019 09:36 AM    HCT 30.7 (L) 05/17/2019 09:36 AM    K 4.2 05/18/2019 08:44 AM    NA 140 05/18/2019 08:44 AM    INR 0.9 03/18/2006 03:00 PM    TROPI 0.04 05/17/2019 06:20 PM    TROPI 0.05 05/17/2019 01:37 PM    TROPI 0.01 03/18/2006 06:10 PM       All imaging reviewed, please see chart for details.    Social History:  Prior Level of Function  Prior level of function: Independent with ADLs, Ambulates with assistive device  Assistive Device: Single point cane  Baseline Activity Level: Community ambulation  Driving: does not drive  Cooking: Yes  DME Currently at Home: [employer], [place]  Living Arrangements: Alone  Type of Home: Apartment(senior/disability building )  Home Layout: One level, Elevator  Bathroom Shower/Tub: Engineer, materials: Grab bars in shower  DME Currently at Home: [place], [place] - Notes / Comments: Patient reports recently moving into apartment, Patient also reports not walking for ~3 weeks (besides from sofa to room for PD) States she  wasn't using toilet but was using adult incontinence briefs instead.       Subjective:  Patient is agreeable to participation in the therapy session. Nursing clears patient for therapy.  Patient Goal: go to rehab and get stronger   Pain Assessment  Pain Assessment: Numeric Scale (0-10)  Pain Score: 5-moderate pain  POSS Score: Awake and Alert  Pain Location: Knee  Pain Orientation: Left  Pain Frequency: Increases with movement      Objective:  Observation of Patient/Vital Signs:  VSS    Inspection/Posture: supine in bed watching TV    Cognitive Status and Neuro Exam:  Cognition/Neuro Status  Arousal/Alertness: Appropriate responses to stimuli  Orientation Level: Oriented X4  Memory: Appears intact  Following Commands: Follows all commands and directions without difficulty  Safety Awareness: minimal verbal  instruction  Insights: Fully aware of deficits  Problem Solving: Assistance required to identify errors made;minimal assistance  Behavior: calm;cooperative  Motor Planning: intact    Musculoskeletal Examination  Gross ROM  Right Lower Extremity ROM: within functional limits  Left Lower Extremity ROM: within functional limits  Gross Strength  Right Lower Extremity Strength: 3/5  Left Lower Extremity Strength: 3-/5       Functional Mobility:  Functional Mobility  Supine to Sit: Contact Guard Assist  Scooting to EOB: Contact Guard Assist  Sit to Stand: Minimal Assist;Increased Effort;Increased Time;bed elevated  Stand to Sit: Contact Guard Assist(vcs for hand placement )  Transfers  Bed to Chair: Contact Guard Assist  Locomotion  Ambulation: Minimal Assist  Pattern: Wide BOS;decreased step length;decreased cadence     Balance  Balance  Balance: needs focused assessment  Sitting - Static: Good  Sitting - Dynamic: Good  Standing - Static: Fair  Standing - Dynamic: Fair    Participation and Activity Tolerance  Participation and Endurance  Participation Effort: good  Endurance: Tolerates 10 - 20 min exercise with multiple rests    Educated the patient to role of physical therapy, plan of care, goals of therapy and safety with mobility and ADLs, energy conservation techniques, discharge instructions; patient verbalized understanding.    Patient left in bedside chair with alarm and all other medical equipment in place and call bell and all personal items/needs within reach.  RN notified of session outcome.      Assessment:  [name] is a 64 y.o. female admitted 05/12/2019. Pt would benefit from Physical Therapy to address deficits and increase functional independence . Pt requires increased therapy to progress mobility in order to safely return home.      PT Assessment  Assessment: Decreased LE strength;Decreased safety/judgement during functional mobility;Decreased endurance/activity tolerance;Decreased UE  strength;Decreased functional mobility;Decreased balance;Gait impairment  Prognosis: Good;With continued PT status post acute discharge  Progress: Progressing toward goals      Treatment:  -Therapist initiated sit to stand x 3 with min assist.   -Therapist initiated gait training x 20 ft with RW with min to cga for safety.   -Therapist initiated seated LE therex with min assist on LLE 2/2 pain.         Plan:  Treatment/Interventions: Personnel officer, Exercise, Neuromuscular re-education, Functional transfer training, LE strengthening/ROM, Endurance training, Patient/family training, Equipment eval/education, Bed mobility, Compensatory technique education  PT Frequency: 2-3x/wk  Risks/Benefits/POC Discussed with Pt/Family: With patient    PMP Activity: Step 6 - Walks in Room  Distance Walked (ft) (Step 6,7): 20 Feet      Goals:  Goals  Goal Formulation: With patient  Time for  Goal Acheivement: 3 visits  Goals: Select goal  Pt Will Go Supine To Sit: with stand by assist, to maximize functional mobility and independence  Pt Will Perform Sit to Stand: with stand by assist, to maximize functional mobility and independence  Pt Will Demo Standing Balance: 4/5 indep, moves/returns center of grav 1-2"/1 plane, to maximize functional mobility and independence  Pt Will Transfer Bed/Chair: to maximize functional mobility and independence, with supervision  Pt Will Ambulate: 51-100 feet, with supervision, to maximize functional mobility and independence  Pt Will Perform Home Exer Program: with stand by assist, to maximize functional mobility and independence         DME Recommended for Discharge: Front wheel walker, BSC, Wheelchair-manual(if  home )  Discharge Recommendation: SNF    Memory Argue, PT, DPT   Physical Therapist  License # [id number]  [phone]

## 2019-05-18 NOTE — Progress Notes (Signed)
[facility]  PROGRESS NOTE      Patient: EDELINE GREENING  Date: 05/18/2019   LOS: 6 Days  Admission Date: 05/12/2019   MRN: [id number]  Attending: Ardeth Sportsman  Please contact me on epic chat       ASSESSMENT/PLAN     RAZIA SCREWS is a 64 y.o. female with history of hypertension, hyperlipidemia, insulin-dependent diabetes mellitus, COPD, end-stage renal disease on peritoneal dialysis who presents to the emergency department for evaluation of a 3-day history of intractable nausea, vomiting and nonbloody diarrhea, admitted for viral gastroenteritis and UTI.    Patient Active Hospital Problem List:    #UGIB - Patient had 1 episode of 400 cc coffee ground emesis on 4/20 per RN  #Possible melena per patient report  -s/p EGD on 4/21: Grade D gastritis  -Continue Protonix 40 mg BID  -Hold ASA and Cilostazol  -Continue to monitor H/H  -GI recommendations appreciated. Outpatient follow-up for repeat EGD.    Chest pain  -EKG, troponin x2   -D dimer elevated, CTA chest negative for PE.     #GNR Peritonitis, pansensitive   #Abdominal pain  #Nausea, vomiting, diarrhea - Resolved  #UTI  -Likely due to viral gastroenteritis/UTI/Peritonitis  -Peritoneal fluid with 51,000 WBC's. Follow-up fluid culture results  -Blood cultures NGTD, lactic acid normal and lipase normal  -Urine culture: >100,000 CFU/ML of multiple bacterial morphotypes   -CT abd/pelvis wo contrast reveals no evidence of bowel obstruction, bowel ischemia, perforation, diverticulitis, pancreatitis, nephrolithiasis and shows intraperitoneal fluid  -Continue patient's home percocet for moderate pain and IV morphine for severe pain  -PRN antiemetics for nausea/vomiting  -Continue IP Fortaz with PD exchanges  -ID recommendations appreciated    #Hypokalemia  #Hypomagnesemia  -K 3.1, potassium chloride 70meq once  -Mg 1.4, magnesium sulfate 1g every 1 hour x2    #Abdominal pain  -Likely due to above  -Pt also reports constipation x1 week. Will start BR      #ESRD on peritoneal dialysis  -Started on dialysis on 05/2018  -CT abd/pelvis shows moderate amount of ascites ? Dialysis fluid  -Under care everywhere, noted that Cr from 10/2018-11/2018 has been 3.8-5 range  -Continue PD with IP antibiotics  -Repeat cell count per nephrology   -Nephrology following    #Hypertension   -Continue home amlodipine, coreg    #Hyperglycemia  #Type 2 DM  -Continue home lantus, SSI, ac/hs accuchecks  -HbA1c 9.8%    #Hyperlipidemia   -Continue home statin    #Iron deficiency anemia  -Completed IV Venofer 3/3  -Continue to monitor H/H  -Hg 6.9, will order 1 unit pRBC transfusion     Nutrition  Diabetic, renal diet    DVT prophylaxis   SCD     Code Status: DNR    DISPO: Pending improvement of abdominal pain    Family Contact: Larene Beach - [phone]; Called and updated daughter on patient's condition and treatment plan.     SUBJECTIVE   Patient seen and examined at bedside.  Denies cp, sob.   Reports episode of emesis.     MEDICATIONS     Current Facility-Administered Medications   Medication Dose Route Frequency    amLODIPine  5 mg Oral Q12H    atorvastatin  40 mg Oral Daily    carvedilol  25 mg Oral Q12H SCH    darbepoetin alfa  60 mcg Subcutaneous Weekly    peritoneal dialysis with additives   Peritoneal catheter Daily    famotidine  10  mg Oral Q12H Vanlue    insulin glargine  15 Units Subcutaneous QHS    insulin lispro  1-4 Units Subcutaneous QHS    insulin lispro  1-8 Units Subcutaneous TID AC    insulin lispro  3 Units Subcutaneous TID MEALS    lactulose  30 g Oral Q8H SCH    pantoprazole  40 mg Oral BID    potassium chloride  40 mEq Oral BID    sevelamer  800 mg Oral TID MEALS       PHYSICAL EXAM     Vitals:    05/18/19 0732   BP: (!) 173/101   Pulse: 68   Resp: 16   Temp: 98.8 F (37.1 C)   SpO2: 99%       Temperature: Temp  Min: 97.5 F (36.4 C)  Max: 99.1 F (37.3 C)  Pulse: Pulse  Min: 63  Max: 69  Respiratory: Resp  Min: 16  Max: 17  Non-Invasive BP: BP  Min:  125/73  Max: 173/101  Pulse Oximetry SpO2  Min: 96 %  Max: 99 %    Intake and Output Summary (Last 24 hours) at Date Time    Intake/Output Summary (Last 24 hours) at 05/18/2019 1108  Last data filed at 05/18/2019 0700  Gross per 24 hour   Intake 860 ml   Output 0 ml   Net 860 ml       GEN APPEARANCE: Normal;  A&OX3  HEENT: PERLA; EOMI; Conjunctiva Clear  NECK: Supple; No bruits  CVS: RRR, S1, S2; No M/G/R  LUNGS: CTAB; No Wheezes; No Rhonchi: No rales  ABD: Soft; NT/ND; + Normoactive BS  EXT: No edema; Pulses 2+ and intact  Skin exam:  pink  NEURO: non focal, moves all extremities   CAP REFILL:  Normal  MENTAL STATUS: flat affect    LABS     Recent Labs   Lab 05/17/19  0936 05/16/19  0354 05/15/19  0551   WBC 9.39 6.53 4.48   RBC 3.21* 2.37* 2.48*   Hgb 9.3* 6.9* 7.2*   Hematocrit 30.7* 23.3* 24.0*   MCV 95.6 98.3* 96.8*   Platelets 145 134* 122*       Recent Labs   Lab 05/18/19  0844 05/17/19  1337 05/17/19  0939 05/16/19  0354 05/15/19  0957 05/14/19  0428   Sodium 140  --  141 138 139 142   Potassium 4.2  --  3.0* 3.1* 3.6 4.0   Chloride 102  --  103 100 103 106   CO2 32*  --  28 28 27 27    BUN 25.0*  --  24.0* 30.0* 33.0* 40.0*   Creatinine 4.4*  --  4.3* 4.5* 4.7* 5.1*   Glucose 255*  --  164* 409* 256* 87   Calcium 7.8*  --  7.4* 7.2* 7.4* 7.5*   Magnesium  --  1.8 1.7 1.4*  --   --        Recent Labs   Lab 05/18/19  0844 05/17/19  0939 05/16/19  0354 05/15/19  0957 05/15/19  0957 05/14/19  0428 05/14/19  0428   ALT  --   --  6  --  11  --  8   AST (SGOT)  --   --  5  --  4*  --  6   Bilirubin, Total  --   --  0.2  --  0.3  --  0.4   Albumin 1.4* 1.4* 1.5*  More results in Results Review 1.5*  More results in Results Review 1.6*   Alkaline Phosphatase  --   --  95  --  91  --  89   More results in Results Review = values in this interval not displayed.       Recent Labs   Lab 05/17/19  1820 05/17/19  1337   Troponin I 0.04 0.05       Microbiology Results     Procedure Component Value Units Date/Time    Blood  Culture Aerobic/Anaerobic #1 [[id number]] Collected: 05/12/19 1855    Specimen: Arm from Blood, Venipuncture Updated: 05/13/19 2221    Narrative:      ORDER#: [id number]                                    ORDERED BY: Dwyane Dee, VINOD  SOURCE: Blood, Venipuncture Arm                      COLLECTED:  05/12/19 18:55  ANTIBIOTICS AT COLL.:                                RECEIVED :  05/12/19 21:53  Culture Blood Aerobic and Anaerobic        PRELIM      05/13/19 22:21  05/13/19   No Growth after 1 day/s of incubation.      Blood Culture Aerobic/Anaerobic #2 [[id number]] Collected: 05/12/19 1855    Specimen: Arm from Blood, Venipuncture Updated: 05/13/19 2221    Narrative:      ORDER#: [id number]                                    ORDERED BY: Dwyane Dee, VINOD  SOURCE: Blood, Venipuncture Arm                      COLLECTED:  05/12/19 18:55  ANTIBIOTICS AT COLL.:                                RECEIVED :  05/12/19 21:53  Culture Blood Aerobic and Anaerobic        PRELIM      05/13/19 22:21  05/13/19   No Growth after 1 day/s of incubation.      COVID-19 (SARS-COV-2) [employer] [facility]) [[id number]] Collected: 05/12/19 1611    Specimen: Nasopharyngeal Swab from Nasopharynx Updated: 05/12/19 1648     Purpose of COVID testing Screening     SARS-CoV-2 Specimen Source Nasopharyngeal     SARS CoV 2 Overall Result Negative     Comment: Test performed using the Abbott ID NOW EUA assay.  Please see Fact Sheets for patients and providers located at:  http://olson-hall.info/  This test is for the qualitative detection of SARS-CoV-2  (COVID19) nucleic acid. Viral nucleic acids may persist in vivo,  independent of viability. Detection of viral nucleic acid does  not imply the presence of infectious virus, or that virus  nucleic acid is the cause of clinical symptoms. Negative  results should be treated as presumptive and, if inconsistent  with clinical signs and symptoms or necessary for patient  management, should be tested with an  alternative molecular  assay. Negative  results do not preclude SARS-CoV-2 infection  and should not be used as the sole basis for patient  management decisions. Invalid results may be due to inhibiting  substances in the specimen and recollection should occur.         Narrative:      o Collect and clearly label specimen type:  o Upper respiratory specimen: One Nasopharyngeal Dry Swab NO  Transport Media.  o Hand deliver to laboratory ASAP  Indication for testing->Extended care facility admission to  semi private room    Culture + Gram Lenise Arena, Body Fluid [[id number]] Collected: 05/13/19 1447    Specimen: Body Fluid from Ascites Fluid Updated: 05/13/19 2148    Narrative:      ORDER#: [id number]                                    ORDERED BY: Evonnie Dawes  SOURCE: Ascites Fluid ascites                        COLLECTED:  05/13/19 14:47  ANTIBIOTICS AT COLL.:                                RECEIVED :  05/13/19 19:33  Stain, Gram                                FINAL       05/13/19 21:48  05/13/19   Many WBCs             No organisms seen  Culture and Gram Stain, Aerobic, Body FluidPENDING      Urine culture [[id number]] Collected: 05/12/19 1611    Specimen: Bladder Urine Updated: 05/14/19 0909    Narrative:      Replace urinary catheter prior to obtaining the urine culture  if it has been in place for greater than or equal to 14  days:->N/A No Foley  Indications for U/A Reflex to Micro - Reflex to  Culture:->Suprapubic Pain/Tenderness or Dysuria  ORDER#: [id number]                                    ORDERED BY: HAILE, LYDIA  SOURCE: Urine                                        COLLECTED:  05/12/19 16:11  ANTIBIOTICS AT COLL.:                                RECEIVED :  05/12/19 16:26  Culture Urine                              FINAL       05/14/19 09:09  05/14/19   >100,000 CFU/ML of multiple bacterial morphotypes present.             Possible contamination, appropriate recollection is             requested if  clinically indicated.  RADIOLOGY     CT Angiogram Chest    Result Date: 05/18/2019     No evidence of pulmonary embolism. Small bilateral pleural effusions. Large hiatal hernia. Trace perihepatic ascites. Other nonurgent findings as above. Lonia Skinner, MD  05/18/2019 9:58 AM      Signed,  Rollen Sox Malashia Kamaka  11:08 AM 05/18/2019

## 2019-05-18 NOTE — Plan of Care (Signed)
Pt [name], forgetful. No c/o pain. Blood sugar checks, coverage given per SSI. Tolerating diet well, lactulose given. Bed linens and gown changed. Pt went for CTA in am. Up to chair in afternoon for lunch. VS wnl, fall precautions in place.     Problem: Moderate/High Fall Risk Score >5  Goal: Patient will remain free of falls  Outcome: Progressing  Flowsheets (Taken 05/18/2019 1100 by Hanley Seamen)  High (Greater than 13):   HIGH-Bed alarm on at all times while patient in bed   HIGH-Apply yellow "Fall Risk" arm band   HIGH-Initiate use of floor mats as appropriate (Pended)     Problem: Pain  Goal: Pain at adequate level as identified by patient  Outcome: Progressing  Flowsheets (Taken 05/17/2019 1837 by Kearney Hard, RN)  Pain at adequate level as identified by patient:   Identify patient comfort function goal   Assess for risk of opioid induced respiratory depression, including snoring/sleep apnea. Alert healthcare team of risk factors identified.   Assess pain on admission, during daily assessment and/or before any "as needed" intervention(s)   Reassess pain within 30-60 minutes of any procedure/intervention, per Pain Assessment, Intervention, Reassessment (AIR) Cycle     Problem: Compromised Tissue integrity  Goal: Damaged tissue is healing and protected  Outcome: Progressing  Flowsheets (Taken 05/17/2019 1837 by Kearney Hard, RN)  Damaged tissue is healing and protected:   Monitor/assess Braden scale every shift   Provide wound care per wound care algorithm   Reposition patient every 2 hours and as needed unless able to reposition self   Increase activity as tolerated/progressive mobility   Relieve pressure to bony prominences for patients at moderate and high risk     Problem: Renal Instability  Goal: Nutritional intake is adequate  Outcome: Progressing  Flowsheets (Taken 05/16/2019 1905 by Gaynelle Cage, RN)  Nutritional intake is adequate:   Assist patient with meals/food selection   Allow adequate time for  meals     Problem: Altered GI Function  Goal: Nutritional intake is adequate  Outcome: Progressing  Flowsheets (Taken 05/16/2019 1905 by Gaynelle Cage, RN)  Nutritional intake is adequate:   Assist patient with meals/food selection   Allow adequate time for meals

## 2019-05-19 ENCOUNTER — Encounter (INDEPENDENT_AMBULATORY_CARE_PROVIDER_SITE_OTHER): Payer: Self-pay

## 2019-05-19 LAB — GLUCOSE WHOLE BLOOD - POCT
Whole Blood Glucose POCT: 146 mg/dL — ABNORMAL HIGH (ref 70–100)
Whole Blood Glucose POCT: 160 mg/dL — ABNORMAL HIGH (ref 70–100)
Whole Blood Glucose POCT: 158 mg/dL — ABNORMAL HIGH (ref 70–100)
Whole Blood Glucose POCT: 412 mg/dL — ABNORMAL HIGH (ref 70–100)

## 2019-05-19 LAB — LAB USE ONLY - HISTORICAL SURGICAL PATHOLOGY

## 2019-05-19 LAB — BODY FLUID PATH REVIEW-

## 2019-05-19 MED ORDER — DIANEAL PD-2/2.5% DEXTROSE 396 MOSM/L IP SOLN
2500.0000 mL | INTRAPERITONEAL | Status: DC
Start: 2019-05-20 — End: 2019-05-24

## 2019-05-19 MED ORDER — DIANEAL PD-2/1.5% DEXTROSE 346 MOSM/L IP SOLN
INTRAPERITONEAL | Status: DC
Start: 2019-05-20 — End: 2019-05-23
  Filled 2019-05-19 (×4): qty 5000

## 2019-05-19 MED ORDER — DIANEAL LOW CALCIUM/2.5% DEX 395 MOSM/L IP SOLN
Freq: Every day | INTRAPERITONEAL | Status: DC
Start: 2019-05-20 — End: 2019-05-19

## 2019-05-19 MED ORDER — CEFAZOLIN SODIUM 1 G IJ SOLR
Freq: Once | INTRAMUSCULAR | Status: AC
Start: 2019-05-19 — End: 2019-05-19
  Filled 2019-05-19: qty 5000

## 2019-05-19 MED ORDER — DIANEAL PD-2/1.5% DEXTROSE 346 MOSM/L IP SOLN
INTRAPERITONEAL | Status: DC
Start: 2019-05-20 — End: 2019-05-19

## 2019-05-19 MED ORDER — BISACODYL 10 MG RE SUPP
10.00 mg | Freq: Every day | RECTAL | Status: DC | PRN
Start: 2019-05-19 — End: 2019-05-26

## 2019-05-19 MED ORDER — DIANEAL LOW CALCIUM/4.25% DEX 483 MOSM/L IP SOLN
2500.0000 mL | INTRAPERITONEAL | Status: DC
Start: 2019-05-20 — End: 2019-05-24
  Filled 2019-05-19: qty 4000

## 2019-05-19 NOTE — Discharge Instr - Activity (Signed)
As tolerated

## 2019-05-19 NOTE — Plan of Care (Signed)
Problem: Renal Instability  Goal: Fluid and electrolyte balance are achieved/maintained  Outcome: Progressing  Flowsheets (Taken 05/19/2019 0829)  Fluid and electrolyte balance are achieved/maintained: Monitor/assess lab values and report abnormal values  Goal: Free from infection  Outcome: Progressing  Flowsheets (Taken 05/19/2019 0829)  Free from infection: Monitor/assess for signs and symptoms of infection     Problem: Patient Receiving Advanced Renal Therapies  Goal: Therapy access site remains intact  Outcome: Progressing  Flowsheets (Taken 05/19/2019 0829)  Therapy access site remains intact: Assess therapy access site

## 2019-05-19 NOTE — Progress Notes (Signed)
Pt A&O x 4  Pt on RA and 24 hour tele: NSR  Pt denies CP and SOB  Pt did complain of pain in her "stomach" when the Clinical Technician was attempting to draw blood for labs. Pt expressed a great amount of distress and pain from the phlebotomy attempts. Pt then tensed whole body and complained of stomach pain. IV Morphine was given. When reassessed, Pt was found sleeping. Labs were not able to be drawn during this shift.  Pt was able to dangle at bedside for a short while.  Pt was undergoing bedside peritoneal dialysis during shift.

## 2019-05-19 NOTE — Plan of Care (Signed)
Alert and oriented X 4, medicated as ordered, no acute distress noted, remain afebrile. Denies SOB, on RA, denies chest pain.  Tolerated meal, no complain of nausea or vomiting. No diabetes related complication noted. Assisted out of bed and up in chair, ambulates with a walker to the bathroom, reported bowel movements X 2  during shift, with diminished urine output. Fall  and safety precautions maintained, educated to call for help when needed, call bell and belongings within reach. Peritoneal dialysis in progress at this time.       Problem: Safety  Goal: Patient will be free from injury during hospitalization  Outcome: Progressing  Goal: Patient will be free from infection during hospitalization  Outcome: Progressing  Flowsheets (Taken 05/17/2019 1837)  Free from Infection during hospitalization:   Assess and monitor for signs and symptoms of infection   Monitor all insertion sites (i.e. indwelling lines, tubes, urinary catheters, and drains)   Monitor lab/diagnostic results   Encourage patient and family to use good hand hygiene technique     Problem: Pain  Goal: Pain at adequate level as identified by patient  Flowsheets (Taken 05/17/2019 1837)  Pain at adequate level as identified by patient:   Identify patient comfort function goal   Assess for risk of opioid induced respiratory depression, including snoring/sleep apnea. Alert healthcare team of risk factors identified.   Assess pain on admission, during daily assessment and/or before any "as needed" intervention(s)   Reassess pain within 30-60 minutes of any procedure/intervention, per Pain Assessment, Intervention, Reassessment (AIR) Cycle     Problem: Compromised Tissue integrity  Goal: Damaged tissue is healing and protected  Flowsheets (Taken 05/17/2019 1837)  Damaged tissue is healing and protected:   Monitor/assess Braden scale every shift   Provide wound care per wound care algorithm   Reposition patient every 2 hours and as needed unless able  to reposition self   Increase activity as tolerated/progressive mobility   Relieve pressure to bony prominences for patients at moderate and high risk     Problem: Renal Instability  Goal: Fluid and electrolyte balance are achieved/maintained  Flowsheets (Taken 05/19/2019 1909)  Fluid and electrolyte balance are achieved/maintained:   Monitor intake and output every shift   Monitor/assess lab values and report abnormal values   Assess for confusion/personality changes   Monitor daily weight   Provide adequate hydration   Assess and reassess fluid and electrolyte status  Goal: Nutritional intake is adequate  Outcome: Progressing  Flowsheets (Taken 05/19/2019 1909)  Nutritional intake is adequate:   Monitor daily weights   Encourage/perform oral hygiene as appropriate   Encourage/administer dietary supplements as ordered (i.e. tube feed, TPN, oral, OGT/NGT, supplements)     Problem: Altered GI Function  Goal: Nutritional intake is adequate  Outcome: Progressing  Flowsheets (Taken 05/19/2019 1909)  Nutritional intake is adequate:   Monitor daily weights   Encourage/perform oral hygiene as appropriate   Encourage/administer dietary supplements as ordered (i.e. tube feed, TPN, oral, OGT/NGT, supplements)  Goal: Elimination patterns are normal or improving  Flowsheets (Taken 05/19/2019 1909)  Elimination patterns are normal or improving:   Report abnormal assessment to physician   Anticipate/assist with toileting needs   Assess for normal bowel sounds   Monitor for abdominal distension  Goal: Mobility/Activity is maintained at optimal level for patient  Outcome: Progressing  Flowsheets (Taken 05/19/2019 1909)  Mobility/activity is maintained at optimal level for patient:   Increase mobility as tolerated/progressive mobility   Encourage independent activity per ability  Perform active/passive ROM   Maintain proper body alignment   Plan activities to conserve energy, plan rest periods   Reposition  patient every 2 hours and as needed unless able to reposition self

## 2019-05-19 NOTE — Progress Notes (Signed)
INFECTIOUS DISEASES PROGRESS NOTE    Date Time: 05/19/19 10:13 AM  Patient Name: [name],[name]  Patient status.Inpatient  Hospital Day: 7    Assessment and Plan:   1. PD Cath peritonitis/ Ecoli (S)to cefazolin  WBC fluid [phone]    PLAN: cefazolin   consider pulling cath.  Subjective:   Non toxic    Review of Systems:   Review of Systems - Gastrointestinal ROS: positive for - abdominal pain  negative for - diarrhea or nausea/vomiting    Antibiotics:   Day 6    Other medications reviewed in EPIC.  Central Access:       Physical Exam:     Vitals:    05/19/19 0837   BP: 158/73   Pulse: 66   Resp: 17   Temp: 98.2 F (36.8 C)   SpO2: 99%       Abdomen - tenderness noted diffusely but especially around cath. BS present    Labs:     Microbiology Results     Procedure Component Value Units Date/Time    Blood Culture Aerobic/Anaerobic #1 [[id number]] Collected: 05/12/19 1855    Specimen: Arm from Blood, Venipuncture Updated: 05/15/19 2221    Narrative:      ORDER#: [id number]                                    ORDERED BY: Dwyane Dee, VINOD  SOURCE: Blood, Venipuncture Arm                      COLLECTED:  05/12/19 18:55  ANTIBIOTICS AT COLL.:                                RECEIVED :  05/12/19 21:53  Culture Blood Aerobic and Anaerobic        PRELIM      05/15/19 22:21  05/13/19   No Growth after 1 day/s of incubation.  05/14/19   No Growth after 2 day/s of incubation.  05/15/19   No Growth after 3 day/s of incubation.      Blood Culture Aerobic/Anaerobic #2 [[id number]] Collected: 05/12/19 1855    Specimen: Arm from Blood, Venipuncture Updated: 05/15/19 2221    Narrative:      ORDER#: [id number]                                    ORDERED BY: Dwyane Dee, VINOD  SOURCE: Blood, Venipuncture Arm                      COLLECTED:  05/12/19 18:55  ANTIBIOTICS AT COLL.:                                RECEIVED :  05/12/19 21:53  Culture Blood Aerobic and Anaerobic        PRELIM      05/15/19 22:21  05/13/19   No Growth after 1 day/s of  incubation.  05/14/19   No Growth after 2 day/s of incubation.  05/15/19   No Growth after 3 day/s of incubation.      COVID-19 (SARS-COV-2) [facility]) [[id number]] Collected: 05/12/19 1611    Specimen: Nasopharyngeal Swab from Nasopharynx Updated: 05/12/19 1648  Purpose of COVID testing Screening     SARS-CoV-2 Specimen Source Nasopharyngeal     SARS CoV 2 Overall Result Negative     Comment: Test performed using the Abbott ID NOW EUA assay.  Please see Fact Sheets for patients and providers located at:  http://olson-hall.info/  This test is for the qualitative detection of SARS-CoV-2  (COVID19) nucleic acid. Viral nucleic acids may persist in vivo,  independent of viability. Detection of viral nucleic acid does  not imply the presence of infectious virus, or that virus  nucleic acid is the cause of clinical symptoms. Negative  results should be treated as presumptive and, if inconsistent  with clinical signs and symptoms or necessary for patient  management, should be tested with an alternative molecular  assay. Negative results do not preclude SARS-CoV-2 infection  and should not be used as the sole basis for patient  management decisions. Invalid results may be due to inhibiting  substances in the specimen and recollection should occur.         Narrative:      o Collect and clearly label specimen type:  o Upper respiratory specimen: One Nasopharyngeal Dry Swab NO  Transport Media.  o Hand deliver to laboratory ASAP  Indication for testing->Extended care facility admission to  semi private room    Culture + Gram Lenise Arena, Body Fluid [[id number]] Collected: 05/13/19 1447    Specimen: Body Fluid from Ascites Fluid Updated: 05/15/19 1818    Narrative:      [id number] called Micro Results of fluid. Results read back [id number], by [id number]  on 05/15/2019 at 18:17  ORDER#: [id number]                                    ORDERED BY: RANA, IRMINDRA  SOURCE: Ascites Fluid ascites                         COLLECTED:  05/13/19 14:47  ANTIBIOTICS AT COLL.:                                RECEIVED :  05/13/19 19:33  [id number]_ called Micro Results of fluid. Results read back [id number], by [id number] on 05/15/2019 at 18:17  Stain, Gram                                FINAL       05/13/19 21:48  05/13/19   Many WBCs             No organisms seen  Culture and Gram Stain, Aerobic, Body FluidFINAL       05/15/19 18:18   +  05/15/19   Very light growth of Escherichia coli      _____________________________________________________________________________                                     E.coli       ANTIBIOTICS                     MIC  INTRP      _____________________________________________________________________________  Amoxicillin/CA                  8/4  S        Ampicillin                      >16    R        Ampicillin/sulbactam           16/8    I        Aztreonam                       <=2    S        Cefazolin                        2     S        Cefepime                        <=1    S        Cefoxitin                       <=4    S        Ceftazidime                     <=2    S        Ceftriaxone                     <=1    S        Cefuroxime                      <=4    S        Ciprofloxacin                 <=0.25   S        Ertapenem                     <=0.25   S        Gentamicin                      <=2    S        Levofloxacin                   <=0.5   S        Meropenem                      <=0.5   S        Piperacillin/Tazobactam        <=2/4   S        Tetracycline                    >8     R        Trimethoprim/Sulfamethoxazole  >2/38   R        _____________________________________________________________________________            S=SUSCEPTIBLE     I=INTERMEDIATE     R=RESISTANT                            N/S=NON-SUSCEPTIBLE  _____________________________________________________________________________      Urine culture [[id number]] Collected: 05/12/19 1611    Specimen: Bladder Urine  Updated: 05/14/19 0909     Narrative:      Replace urinary catheter prior to obtaining the urine culture  if it has been in place for greater than or equal to 14  days:->N/A No Foley  Indications for U/A Reflex to Micro - Reflex to  Culture:->Suprapubic Pain/Tenderness or Dysuria  ORDER#: [id number]                                    ORDERED BY: HAILE, LYDIA  SOURCE: Urine                                        COLLECTED:  05/12/19 16:11  ANTIBIOTICS AT COLL.:                                RECEIVED :  05/12/19 16:26  Culture Urine                              FINAL       05/14/19 09:09  05/14/19   >100,000 CFU/ML of multiple bacterial morphotypes present.             Possible contamination, appropriate recollection is             requested if clinically indicated.            CBC w/Diff CMP   Recent Labs   Lab 05/17/19  0936 05/16/19  0354 05/15/19  0551   WBC 9.39 6.53 4.48   Hgb 9.3* 6.9* 7.2*   Hematocrit 30.7* 23.3* 24.0*   Platelets 145 134* 122*   MCV 95.6 98.3* 96.8*   Neutrophils 76.7  --   --    Segmented Neutrophils  --  78 74       PT/INR         Recent Labs   Lab 05/18/19  0844 05/17/19  1337 05/17/19  0939 05/16/19  0354 05/15/19  0957 05/15/19  0957 05/14/19  0428 05/14/19  0428   Sodium 140  --  141 138  More results in Results Review 139  More results in Results Review 142   Potassium 4.2  --  3.0* 3.1*  More results in Results Review 3.6  More results in Results Review 4.0   Chloride 102  --  103 100  More results in Results Review 103  More results in Results Review 106   CO2 32*  --  28 28  More results in Results Review 27  More results in Results Review 27   BUN 25.0*  --  24.0* 30.0*  More results in Results Review 33.0*  More results in Results Review 40.0*   Creatinine 4.4*  --  4.3* 4.5*  More results in Results Review 4.7*  More results in Results Review 5.1*   Glucose 255*  --  164* 409*  More results in Results Review 256*  More results in Results Review 87   Calcium 7.8*  --  7.4* 7.2*  More results in Results  Review 7.4*  More results in Results Review 7.5*   Magnesium  --  1.8 1.7 1.4*  --   --   --   --    Phosphorus  3.6  --  3.8  --   --   --   --  4.2   Protein, Total  --   --   --  4.6*  --  4.2*  --  4.4*   Albumin 1.4*  --  1.4* 1.5*  More results in Results Review 1.5*  More results in Results Review 1.6*   AST (SGOT)  --   --   --  5  --  4*  --  6   ALT  --   --   --  6  --  11  --  8   Alkaline Phosphatase  --   --   --  95  --  91  --  89   Bilirubin, Total  --   --   --  0.2  --  0.3  --  0.4   More results in Results Review = values in this interval not displayed.      Glucose POCT   Recent Labs   Lab 05/18/19  0844 05/17/19  0939 05/16/19  0354 05/15/19  0957 05/14/19  0428 05/13/19  1132 05/12/19  1455   Glucose 255* 164* 409* 256* 87 125* 551*          Rads:     Radiology Results (24 Hour)     ** No results found for the last 24 hours. **            Signed by: Ernst Breach

## 2019-05-19 NOTE — UM Notes (Signed)
[facility] - B Utilization Review   NPI #[id number], Tax ID [id number]  Please call Milas Gain MSN RN CCM @ [phone] with any questions or concerns.  Email:  [email or web address]  Fax final authorization and requests for additional information to [phone]    CONCURRENT REVIEW FOR: 4/24-4/26    LOC: ACUTE    PATIENT NAME: [name],[name] / 04-29-1955 / AGE: 64 y.o.      S/I:  SUBJECTIVE:   4/24: Reports sob and chest pain when taking a deep breath.   Abdominal pain has improved per pt.     4/25: ABDOMINAL PAIN; EMESIS; HYPERTENSIVE     V/S:    Vitals:    05/17/19 1114   BP: 125/73   Pulse: 63   Resp: 16   Temp: 99.1 F (37.3 C)   SpO2: 98%          Temperature: Temp  Min: 98 F (36.7 C)  Max: 99.1 F (37.3 C)  Pulse: Pulse  Min: 63  Max: 67  Respiratory: Resp  Min: 16  Max: 18  Non-Invasive BP: BP  Min: 125/73  Max: 164/71  Pulse Oximetry SpO2  Min: 94 %  Max: 98 %  PAIN MAX 9/10      Vitals:    05/18/19 0732   BP: (!) 173/101   Pulse: 68   Resp: 16   Temp: 98.8 F (37.1 C)   SpO2: 99%          Temperature: Temp  Min: 97.5 F (36.4 C)  Max: 99.1 F (37.3 C)  Pulse: Pulse  Min: 63  Max: 69  Respiratory: Resp  Min: 16  Max: 17  Non-Invasive BP: BP  Min: 125/73  Max: 173/101  Pulse Oximetry SpO2  Min: 96 %  Max: 99 %  PAIN MAX 9/10    Vitals:    05/19/19 1212   BP: 152/84   Pulse: 62   Resp: 17   Temp:    SpO2: 99%          Temperature: Temp  Min: 97.5 F (36.4 C)  Max: 98.8 F (37.1 C)  Pulse: Pulse  Min: 60  Max: 71  Respiratory: Resp  Min: 16  Max: 17  Non-Invasive BP: BP  Min: 135/77  Max: 186/90  Pulse Oximetry SpO2  Min: 98 %  Max: 99 %      Labs -  Lab 05/17/19  0936   RBC 3.21*   Hgb 9.3*   Hematocrit 30.7*       Lab 05/18/19  0844 05/17/19  0939   Potassium 4.2 3.0*   Chloride 102 103   CO2 32* 28   BUN 25.0* 24.0*   Creatinine 4.4* 4.3*   Glucose 255* 164*   Calcium 7.8* 7.4*       Lab 05/18/19  0844 05/17/19  0939   Albumin 1.4* 1.4*     4/26  GLUCOSE  160    Radiologic Studies -   CT Angiogram Chest    Result Date: 05/18/2019     No evidence of pulmonary embolism. Small bilateral pleural effusions. Large hiatal hernia. Trace perihepatic ascites. Other nonurgent findings as above. Lonia Skinner, MD  05/18/2019 9:58 AM          MD NOTES:  MEDICINE NOTES ON 05/17/19  #UGIB - Patient had 1 episode of 400 cc coffee ground emesis on 4/20 per RN  #Possible melena per patient report  -s/p EGD on 4/21: Grade D gastritis  -Continue Protonix  40 mg BID  -Hold ASA and Cilostazol  -Continue to monitor H/H  -GI recommendations appreciated. Outpatient follow-up for repeat EGD.    Chest pain  -EKG, troponin x2   -D dimer     #GNR Peritonitis, pansensitive   #Abdominal pain  #Nausea, vomiting, diarrhea - Resolved  #UTI  -Likelydue toviral gastroenteritis/UTI/Peritonitis  -Peritoneal fluid with 51,000 WBC's. Follow-up fluid culture results  -Blood cultures NGTD, lactic acid normal and lipase normal  -Urine culture: >100,000 CFU/ML of multiple bacterial morphotypes   -CT abd/pelvis wo contrastrevealsno evidence of bowel obstruction, bowel ischemia, perforation, diverticulitis, pancreatitis, nephrolithiasis and shows intraperitoneal fluid  -Continue patient's homepercocetfor moderate pain and IV morphine for severe pain  -PRN antiemetics for nausea/vomiting  -Continue IP Fortaz with PD exchanges  -ID recommendations appreciated    #Hypokalemia  #Hypomagnesemia  -K 3.1, potassium chloride 79meq once  -Mg 1.4, magnesium sulfate 1g every 1 hour x2    #Abdominal pain  -Likely due to above  -Pt also reports constipation x1 week. Will start BR     #ESRD on peritoneal dialysis  -Started on dialysis on 05/2018  -CT abd/pelvis shows moderate amount of ascites ? Dialysis fluid  -Under care everywhere, noted that Cr from 10/2018-11/2018 has been 3.8-5 range  -Continue PD with IP antibiotics  -Repeat cell count per nephrology   -Nephrology following    #Hypertension  -Continue home  amlodipine, coreg    #Hyperglycemia  #Type 2 DM  -Continue home lantus, SSI, ac/hs accuchecks  -HbA1c 9.8%    #Hyperlipidemia   -Continue home statin    #Iron deficiency anemia  -Continue IV Venofer - Day 2/3  -Continue to monitor H/H  -Hg 6.9, will order 1 unit pRBC transfusion     Nutrition  Diabetic, renal diet    DVT prophylaxis  SCD  DISPO: Pending improvement of abdominal pain    NEPHROLOGY NOTES ON 4/25  Assessment:     1. PD associated peritonitis - E Coli   - cell count improving  2. ESRD- on CAPD  3. Anemia- CKD + Fe deficiency now exacerbated by GI bleed  4. Volume overload  5. Hypokalemia  6. Hypomagnesemia - resolved  Recommendations:   1. Labs ordered  2. Start standing KCL 40 meq bid  3. Increase Norvasc  4. Cont IP cefazolin        ID NOTES ON 05/19/19  Assessment and Plan:   1. PD Cath peritonitis/ Ecoli (S)to cefazolin  WBC fluid [phone]    PLAN: cefazolin   consider pulling cath.      NEPHROLOGY NOTES  ON 4/26  Assessment:     1. PD associated peritonitis - E Coli  - cell count improving  - no need to remove catheter  2. ESRD- on CAPD  3. Anemia- CKD + Fe deficiency now exacerbated by GI bleed  4. Volume overload  5. Hypokalemia  6. Hypomagnesemia - resolved  Recommendations:   1. Labs ordered  2. Continue standing KCL 40 meq bid  3. Increase Norvasc  4. Cont IP cefazolin; will change cefazolin to last fill starting tmrw  5. PRN suppository if no BM this evening  6. Added a red bag today b/c of increased BP and pt c/o of extra fluid  Overall stable from renal standpoint - pt can continue abx as OP       Current Medications:   Scheduled Meds:  Current Facility-Administered Medications   Medication Dose Route Frequency    amLODIPine  5 mg  Oral Q12H    atorvastatin  40 mg Oral Daily    carvedilol  25 mg Oral Q12H SCH    darbepoetin alfa  60 mcg Subcutaneous Weekly    [START ON 05/20/2019] peritoneal dialysis with additives   Peritoneal catheter Q24H    famotidine  10 mg Oral Q12H [place]     insulin glargine  15 Units Subcutaneous QHS    insulin lispro  1-4 Units Subcutaneous QHS    insulin lispro  1-8 Units Subcutaneous TID AC    insulin lispro  3 Units Subcutaneous TID MEALS    lactulose  30 g Oral Q8H SCH    pantoprazole  40 mg Oral BID    potassium chloride  40 mEq Oral BID    sevelamer  800 mg Oral TID MEALS     Continuous Infusions:   sodium chloride 100 mL/hr at 05/14/19 1828    [START ON 05/20/2019] dianeal low calcium (2.5 mEq/[name]) 4.25% dextrose      [START ON 05/20/2019] dianeal PD-2 (calcium 3.5 mEq/[name]) 2.5% dextrose      lactated ringers Stopped (05/15/19 1409)     PRN Meds:.sodium chloride, bisacodyl, Nursing communication: Adult Hypoglycemia Treatment Algorithm **AND** dextrose **AND** dextrose **AND** glucagon (rDNA), morphine, naloxone, ondansetron **OR** ondansetron, oxyCODONE-acetaminophen     4/24  MORPHINE 2 MG IV--X2    4/25  PERCOCET 1 TAB PO--X1    4/26  MORPHINE 2 MG IV--X1    DISPO: PENDING SNF PLACEMENT

## 2019-05-19 NOTE — Progress Notes (Signed)
Pt is alert and oriented.  Pt denied pain and sob. CCPD completed. Effluent appearance clear. Total UF of 419ml.  Timeouts and safety checks done. Will closely monitor the pt.       05/19/19 0827   Completing APD/CCPD (Taking the Patient OFF)    Effluent Appearance Clear   Specimen Collected No   Volume Collected in Drainage Bag (mL) 439 mL   Peritoneal Dialysis Comments CCPD completed   Peritoneal Dialysis Catheter   No Placement Date or Time found.   Present on Admission?: Yes  Catheter Type: Baxter   Site Assessment Clean;Dry;Intact   Status Clamped   Site Condition No complications   Dressing Status Clean;Dry;Intact   Dressing Occlusive

## 2019-05-19 NOTE — Progress Notes (Signed)
Pt is alert and oriented. Pt denied pain and sob. No other complain given. CCPD started at 1800, without any issues. PD dressing clean and dry. Pt was seen by Dr. Shaune Pascal during rounds. Timeout and safety checks done. Reports were given to Primary nurse.       05/19/19 1800   Initiating APD (Putting the Patient ON)    $APD Start Time 1800   Total Treatment Time Ordered (hours) 10 hours   Number of Cycles/Exchanges Ordered 5   Total Fill Volume (mL) 15000 mL   Fluid Orders for APD/CCPD   Cycler Bag #1 4.25%, 3.5 Calcium in 5052mL   Cycler Bag #2 1.5%, 3.5 Calcium in 5000 mL   Cycler Bag #3 2.5 %, Low Calcium in 5000 mL   Last Fill Dianeal Solution Dextrose 2.5%   Last Fill Volume -if intermittent (mL) 2500 mL   Peritoneal Dialysis Catheter   No Placement Date or Time found.   Present on Admission?: Yes  Catheter Type: Baxter   Site Assessment Clean;Dry;Intact   Status Accessed   Site Condition No complications   Dressing Status Clean;Dry;Intact   Dressing Occlusive

## 2019-05-19 NOTE — Progress Notes (Signed)
[facility]  PROGRESS NOTE      Patient: [name]  Date: 05/19/2019   LOS: 7 Days  Admission Date: 05/12/2019   MRN: [id number]  Attending: Ardeth Sportsman  Please contact me on epic chat       ASSESSMENT/PLAN     [name] is a 64 y.o. female with history of hypertension, hyperlipidemia, insulin-dependent diabetes mellitus, COPD, end-stage renal disease on peritoneal dialysis who presents to the emergency department for evaluation of a 3-day history of intractable nausea, vomiting and nonbloody diarrhea, admitted for viral gastroenteritis and UTI.    [name] order has been placed. Awaiting placement.     Patient Active Hospital Problem List:    #UGIB - Patient had 1 episode of 400 cc coffee ground emesis on 4/20 per RN  #Possible melena per patient report  -s/p EGD on 4/21: Grade D gastritis  -Continue Protonix 40 mg BID  -Hold ASA and Cilostazol  -Continue to monitor H/H  -GI recommendations appreciated. Outpatient follow-up for repeat EGD.    Chest pain  -EKG, troponin x2   -D dimer elevated, CTA chest negative for PE.     #GNR Peritonitis, pansensitive   #Abdominal pain  #Nausea, vomiting, diarrhea - Resolved  #UTI  -Likely due to viral gastroenteritis/UTI/Peritonitis  -Peritoneal fluid with 51,000 WBC's. Improved to 200s   -Blood cultures NGTD, lactic acid normal and lipase normal  -Urine culture: >100,000 CFU/ML of multiple bacterial morphotypes   -CT abd/pelvis wo contrast reveals no evidence of bowel obstruction, bowel ischemia, perforation, diverticulitis, pancreatitis, nephrolithiasis and shows intraperitoneal fluid  -Continue patient's home percocet for moderate pain and IV morphine for severe pain  -PRN antiemetics for nausea/vomiting  -Continue IP cefazolin with PD exchanges  -ID recommendations appreciated    #Hypokalemia  #Hypomagnesemia  -Resolved     #Abdominal pain, resolved.   -Likely due to above  -Pt also reports constipation x1 week. Will start BR     #ESRD on peritoneal  dialysis  -Started on dialysis on 05/2018  -CT abd/pelvis shows moderate amount of ascites ? Dialysis fluid  -Under care everywhere, noted that Cr from 10/2018-11/2018 has been 3.8-5 range  -Continue PD with IP antibiotics  -Repeat cell count per nephrology   -Nephrology following    #Hypertension   -Continue home amlodipine, coreg    #Hyperglycemia  #Type 2 DM  -Continue home lantus, SSI, ac/hs accuchecks  -HbA1c 9.8%    #Hyperlipidemia   -Continue home statin    #Iron deficiency anemia  -Completed IV Venofer 3/3  -Continue to monitor H/H  -Hg 6.9, will order 1 unit pRBC transfusion     Nutrition  Diabetic, renal diet    DVT prophylaxis   SCD     Code Status: DNR    DISPO: [place] to SNF      SUBJECTIVE   Patient seen and examined at bedside.  Denies cp, sob.   Denies nausea/vomiting.    MEDICATIONS     Current Facility-Administered Medications   Medication Dose Route Frequency    amLODIPine  5 mg Oral Q12H    atorvastatin  40 mg Oral Daily    carvedilol  25 mg Oral Q12H SCH    darbepoetin alfa  60 mcg Subcutaneous Weekly    [START ON 05/20/2019] peritoneal dialysis with additives   Peritoneal catheter Q24H    famotidine  10 mg Oral Q12H [place]    insulin glargine  15 Units Subcutaneous QHS    insulin lispro  1-4 Units Subcutaneous QHS    insulin lispro  1-8 Units Subcutaneous TID AC    insulin lispro  3 Units Subcutaneous TID MEALS    lactulose  30 g Oral Q8H SCH    pantoprazole  40 mg Oral BID    potassium chloride  40 mEq Oral BID    sevelamer  800 mg Oral TID MEALS       PHYSICAL EXAM     Vitals:    05/19/19 1212   BP: 152/84   Pulse: 62   Resp: 17   Temp:    SpO2: 99%       Temperature: Temp  Min: 97.5 F (36.4 C)  Max: 98.8 F (37.1 C)  Pulse: Pulse  Min: 60  Max: 71  Respiratory: Resp  Min: 16  Max: 17  Non-Invasive BP: BP  Min: 135/77  Max: 186/90  Pulse Oximetry SpO2  Min: 98 %  Max: 99 %    Intake and Output Summary (Last 24 hours) at Date Time    Intake/Output Summary (Last 24 hours) at  05/19/2019 1448  Last data filed at 05/19/2019 0655  Gross per 24 hour   Intake 1170 ml   Output 0 ml   Net 1170 ml       GEN APPEARANCE: Normal;  A&OX3  HEENT: PERLA; EOMI; Conjunctiva Clear  NECK: Supple; No bruits  CVS: RRR, S1, S2; No M/G/R  LUNGS: CTAB; No Wheezes; No Rhonchi: No rales  ABD: Soft; NT/ND; + Normoactive BS  EXT: No edema; Pulses 2+ and intact  Skin exam:  pink  NEURO: non focal, moves all extremities   CAP REFILL:  Normal  MENTAL STATUS: flat affect    LABS     Recent Labs   Lab 05/17/19  0936 05/16/19  0354 05/15/19  0551   WBC 9.39 6.53 4.48   RBC 3.21* 2.37* 2.48*   Hgb 9.3* 6.9* 7.2*   Hematocrit 30.7* 23.3* 24.0*   MCV 95.6 98.3* 96.8*   Platelets 145 134* 122*       Recent Labs   Lab 05/18/19  0844 05/17/19  1337 05/17/19  0939 05/16/19  0354 05/15/19  0957 05/14/19  0428   Sodium 140  --  141 138 139 142   Potassium 4.2  --  3.0* 3.1* 3.6 4.0   Chloride 102  --  103 100 103 106   CO2 32*  --  28 28 27 27    BUN 25.0*  --  24.0* 30.0* 33.0* 40.0*   Creatinine 4.4*  --  4.3* 4.5* 4.7* 5.1*   Glucose 255*  --  164* 409* 256* 87   Calcium 7.8*  --  7.4* 7.2* 7.4* 7.5*   Magnesium  --  1.8 1.7 1.4*  --   --        Recent Labs   Lab 05/18/19  0844 05/17/19  0939 05/16/19  0354 05/15/19  0957 05/15/19  0957 05/14/19  0428 05/14/19  0428   ALT  --   --  6  --  11  --  8   AST (SGOT)  --   --  5  --  4*  --  6   Bilirubin, Total  --   --  0.2  --  0.3  --  0.4   Albumin 1.4* 1.4* 1.5*  More results in Results Review 1.5*  More results in Results Review 1.6*   Alkaline Phosphatase  --   --  95  --  91  --  89   More results in Results Review = values in this interval not displayed.       Recent Labs   Lab 05/17/19  1820 05/17/19  1337   Troponin I 0.04 0.05       Microbiology Results     Procedure Component Value Units Date/Time    Blood Culture Aerobic/Anaerobic #1 [[id number]] Collected: 05/12/19 1855    Specimen: Arm from Blood, Venipuncture Updated: 05/13/19 2221    Narrative:      ORDER#: [id number]                                     ORDERED BY: Dwyane Dee, VINOD  SOURCE: Blood, Venipuncture Arm                      COLLECTED:  05/12/19 18:55  ANTIBIOTICS AT COLL.:                                RECEIVED :  05/12/19 21:53  Culture Blood Aerobic and Anaerobic        PRELIM      05/13/19 22:21  05/13/19   No Growth after 1 day/s of incubation.      Blood Culture Aerobic/Anaerobic #2 [[id number]] Collected: 05/12/19 1855    Specimen: Arm from Blood, Venipuncture Updated: 05/13/19 2221    Narrative:      ORDER#: [id number]                                    ORDERED BY: Dwyane Dee, VINOD  SOURCE: Blood, Venipuncture Arm                      COLLECTED:  05/12/19 18:55  ANTIBIOTICS AT COLL.:                                RECEIVED :  05/12/19 21:53  Culture Blood Aerobic and Anaerobic        PRELIM      05/13/19 22:21  05/13/19   No Growth after 1 day/s of incubation.      COVID-19 (SARS-COV-2) [employer] [facility]) [[id number]] Collected: 05/12/19 1611    Specimen: Nasopharyngeal Swab from Nasopharynx Updated: 05/12/19 1648     Purpose of COVID testing Screening     SARS-CoV-2 Specimen Source Nasopharyngeal     SARS CoV 2 Overall Result Negative     Comment: Test performed using the Abbott ID NOW EUA assay.  Please see Fact Sheets for patients and providers located at:  http://olson-hall.info/  This test is for the qualitative detection of SARS-CoV-2  (COVID19) nucleic acid. Viral nucleic acids may persist in vivo,  independent of viability. Detection of viral nucleic acid does  not imply the presence of infectious virus, or that virus  nucleic acid is the cause of clinical symptoms. Negative  results should be treated as presumptive and, if inconsistent  with clinical signs and symptoms or necessary for patient  management, should be tested with an alternative molecular  assay. Negative results do not preclude SARS-CoV-2 infection  and should not be used as the sole basis for patient  management decisions. Invalid results  may be due to  inhibiting  substances in the specimen and recollection should occur.         Narrative:      o Collect and clearly label specimen type:  o Upper respiratory specimen: One Nasopharyngeal Dry Swab NO  Transport Media.  o Hand deliver to laboratory ASAP  Indication for testing->Extended care facility admission to  semi private room    Culture + Gram Lenise Arena, Body Fluid [[id number]] Collected: 05/13/19 1447    Specimen: Body Fluid from Ascites Fluid Updated: 05/13/19 2148    Narrative:      ORDER#: [id number]                                    ORDERED BY: Evonnie Dawes  SOURCE: Ascites Fluid ascites                        COLLECTED:  05/13/19 14:47  ANTIBIOTICS AT COLL.:                                RECEIVED :  05/13/19 19:33  Stain, Gram                                FINAL       05/13/19 21:48  05/13/19   Many WBCs             No organisms seen  Culture and Gram Stain, Aerobic, Body FluidPENDING      Urine culture [[id number]] Collected: 05/12/19 1611    Specimen: Bladder Urine Updated: 05/14/19 0909    Narrative:      Replace urinary catheter prior to obtaining the urine culture  if it has been in place for greater than or equal to 14  days:->N/A No Foley  Indications for U/A Reflex to Micro - Reflex to  Culture:->Suprapubic Pain/Tenderness or Dysuria  ORDER#: [id number]                                    ORDERED BY: HAILE, LYDIA  SOURCE: Urine                                        COLLECTED:  05/12/19 16:11  ANTIBIOTICS AT COLL.:                                RECEIVED :  05/12/19 16:26  Culture Urine                              FINAL       05/14/19 09:09  05/14/19   >100,000 CFU/ML of multiple bacterial morphotypes present.             Possible contamination, appropriate recollection is             requested if clinically indicated.            RADIOLOGY     No results found.    Keitha Butte Webb Weed  2:48 PM 05/19/2019

## 2019-05-19 NOTE — Plan of Care (Signed)
Problem: Moderate/High Fall Risk Score >5  Goal: Patient will remain free of falls  Outcome: Progressing  Flowsheets  Taken 05/18/2019 1100 by Hanley Seamen  High (Greater than 13):   HIGH-Bed alarm on at all times while patient in bed   HIGH-Apply yellow "Fall Risk" arm band   HIGH-Initiate use of floor mats as appropriate  Taken 05/16/2019 1905 by Gaynelle Cage, RN  Moderate Risk (6-13):   MOD-Consider activation of bed alarm if appropriate   MOD-Perform dangle, stand, walk (DSW) when getting patient up   MOD-Use gait belt when appropriate     Problem: Safety  Goal: Patient will be free from injury during hospitalization  Outcome: Progressing  Flowsheets (Taken 05/17/2019 1837 by Kearney Hard, RN)  Patient will be free from injury during hospitalization:   Assess patient's risk for falls and implement fall prevention plan of care per policy   Use appropriate transfer methods   Provide and maintain safe environment   Ensure appropriate safety devices are available at the bedside   Hourly rounding   Include patient/ family/ care giver in decisions related to safety  Goal: Patient will be free from infection during hospitalization  Outcome: Progressing     Problem: Pain  Goal: Pain at adequate level as identified by patient  Outcome: Progressing  Flowsheets (Taken 05/17/2019 1837 by Kearney Hard, RN)  Pain at adequate level as identified by patient:   Identify patient comfort function goal   Assess for risk of opioid induced respiratory depression, including snoring/sleep apnea. Alert healthcare team of risk factors identified.   Assess pain on admission, during daily assessment and/or before any "as needed" intervention(s)   Reassess pain within 30-60 minutes of any procedure/intervention, per Pain Assessment, Intervention, Reassessment (AIR) Cycle     Problem: Patient Receiving Advanced Renal Therapies  Goal: Therapy access site remains intact  Outcome: Progressing  Flowsheets (Taken 05/19/2019 0914)  Therapy  access site remains intact:   Assess therapy access site   Change therapy access site dressing as needed     Problem: Altered GI Function  Goal: Elimination patterns are normal or improving  Outcome: Progressing  Flowsheets (Taken 05/19/2019 0914)  Elimination patterns are normal or improving:   Anticipate/assist with toileting needs   Assess for normal bowel sounds   Monitor for abdominal distension   Monitor for abdominal discomfort   Assess for signs and symptoms of bleeding.  Report signs of bleeding to physician   Administer treatments as ordered   Assess for flatus   Assess for and discuss C. diff screening with LIP   Administer medications to improve bowel evacuation as prescribed

## 2019-05-19 NOTE — Progress Notes (Signed)
[facility] PROGRESS NOTE  [place] Edelman, x [phone] [facility] Spectralink)      Date Time: 05/19/19 11:36 AM  Patient Name: [name]  Attending Physician: Ardeth Sportsman, MD    CC: follow-up ESRD  PD note    Assessment:     1. PD associated peritonitis - E Coli  - cell count improving  - no need to remove catheter  2. ESRD- on CAPD  3. Anemia- CKD + Fe deficiency now exacerbated by GI bleed  4. Volume overload  5. Hypokalemia  6. Hypomagnesemia - resolved        Recommendations:   1. Labs ordered  2. Continue standing KCL 40 meq bid  3. Increase Norvasc  4. Cont IP cefazolin; will change cefazolin to last fill starting tmrw  5. PRN suppository if no BM this evening  6. Added a red bag today b/c of increased BP and pt c/o of extra fluid    Overall stable from renal standpoint - pt can continue abx as OP       Case discussed with:  Pt and HD-RN    Real Cona Oretha Ellis, MD   [facility]  [phone] (office)  X [phone] (FFX Spectra-Link)      Subjective:   No BM- mild abd pain    Review of Systems:   Cardio: no chest pain, no edema  Pulm: no SOB  Abd: + tenderness in the RLQ      Physical Exam:     Vitals:    05/18/19 2323 05/19/19 0328 05/19/19 0328 05/19/19 0837   BP: 146/62 186/90 186/90 158/73   Pulse: 61 68 67 66   Resp: 16 16  17    Temp: 98.1 F (36.7 C) 98.8 F (37.1 C)  98.2 F (36.8 C)   TempSrc: Oral Axillary  Oral   SpO2: 99% 98%  99%   Weight:  122 kg (268 lb 15.4 oz)     Height:           Intake and Output Summary (Last 24 hours) at Date Time    Intake/Output Summary (Last 24 hours) at 05/19/2019 1136  Last data filed at 05/19/2019 0655  Gross per 24 hour   Intake 1170 ml   Output 0 ml   Net 1170 ml       General: obese female, awake, alert, oriented x 3, no acute distress.  Cardiovascular: regular rate and rhythm  Lungs: clear to auscultation bilaterally, bilateral air entry,normal work of breathing  Abdomen: soft, RLQ tenderness to light palpation - pain improved on the  left side, non-distended, LLQ PD catheter  Extremities: +edema      Meds:      Scheduled Meds: PRN Meds:    amLODIPine, 5 mg, Oral, Q12H  atorvastatin, 40 mg, Oral, Daily  carvedilol, 25 mg, Oral, Q12H SCH  darbepoetin alfa, 60 mcg, Subcutaneous, Weekly  peritoneal dialysis with additives, , Peritoneal catheter, Daily  famotidine, 10 mg, Oral, Q12H SCH  insulin glargine, 15 Units, Subcutaneous, QHS  insulin lispro, 1-4 Units, Subcutaneous, QHS  insulin lispro, 1-8 Units, Subcutaneous, TID AC  insulin lispro, 3 Units, Subcutaneous, TID MEALS  lactulose, 30 g, Oral, Q8H SCH  pantoprazole, 40 mg, Oral, BID  potassium chloride, 40 mEq, Oral, BID  sevelamer, 800 mg, Oral, TID MEALS          Continuous Infusions:   sodium chloride 100 mL/hr at 05/14/19 1828    lactated ringers Stopped (05/15/19 1409)  sodium chloride, , PRN  dextrose, 15 g of glucose, PRN   And  dextrose, 12.5 g, PRN   And  glucagon (rDNA), 1 mg, PRN  morphine, 2 mg, Q4H PRN  naloxone, 0.2 mg, PRN  ondansetron, 4 mg, Q6H PRN   Or  ondansetron, 4 mg, Q6H PRN  oxyCODONE-acetaminophen, 1 tablet, Q4H PRN              Labs:     Recent Labs   Lab 05/17/19  0936 05/16/19  0354 05/15/19  0551   WBC 9.39 6.53 4.48   Hgb 9.3* 6.9* 7.2*   Hematocrit 30.7* 23.3* 24.0*   Platelets 145 134* 122*     Recent Labs   Lab 05/18/19  0844 05/17/19  1337 05/17/19  0939 05/16/19  0354 05/15/19  0957 05/14/19  0428   Sodium 140  --  141 138  More results in Results Review 142   Potassium 4.2  --  3.0* 3.1*  More results in Results Review 4.0   Chloride 102  --  103 100  More results in Results Review 106   CO2 32*  --  28 28  More results in Results Review 27   BUN 25.0*  --  24.0* 30.0*  More results in Results Review 40.0*   Creatinine 4.4*  --  4.3* 4.5*  More results in Results Review 5.1*   Calcium 7.8*  --  7.4* 7.2*  More results in Results Review 7.5*   Albumin 1.4*  --  1.4* 1.5*  More results in Results Review 1.6*   Phosphorus 3.6  --  3.8  --   --  4.2    Magnesium  --  1.8 1.7 1.4*  --   --    Glucose 255*  --  164* 409*  More results in Results Review 87   EGFR 12.2  --  12.5 11.9  More results in Results Review 10.3   More results in Results Review = values in this interval not displayed.       Recent Labs   Lab 05/12/19  1611   Urine Type Urine, Catheriz   Color, UA Yellow   Clarity, UA Sl Cloudy*   Specific Gravity UA 1.008   Urine pH 9.0*   Nitrite, UA Negative   Ketones UA Negative   Urobilinogen, UA 4.0*   Bilirubin, UA Negative   Blood, UA Large*   RBC, UA 26 - 50*   WBC, UA 26 - 50*           Signed by: Renard Hamper, MD

## 2019-05-19 NOTE — Progress Notes (Signed)
[facility] ([place])    Received a referral to schedule a follow up appointment with the [facility].  Appointment scheduled for Thursday 05/22/19 at 10:30am with NP Salomon Mast at [place] location.      Clinic address is as follows:    [address] [place], [place]      Please notify patient to arrive 15 minutes early to the appointment and bring the following materials with them:    Insurance card (if insured) and photo ID  Medications in their original bottles  Glucometer/blood sugar log (if diabetic)  Weight log (if heart failure)  Proof of income (to enroll in medication assistance programs-first two pages of signed 1040 tax forms or last 2 months of pay stubs)      Derinda Sis  Transitional Services   Sched Reg Rep II  T [phone]  F [phone]

## 2019-05-19 NOTE — Progress Notes (Addendum)
Left VM for [place] re: PDavailability at their facility     Spoke with [facility] admissions. They do not provide PD.     Pt in agreement with SNF. Pt needs SNF with PD. CM spoke with [facility] intake line [place] and they will call CM back with an accepting facility with PD. Per pt she has been to a rehab in [place] , [place] but shed doesn't know the name. Will f/u with [facility] tomorrow.                 Dede Query, RN MSN       Clinical Case Manager       [facility]        [address], [place], [place] [address]      [email or web address]      [phone]

## 2019-05-19 NOTE — Discharge Instr - Diet (Signed)
Diabetic- renal diet

## 2019-05-20 LAB — COMPREHENSIVE METABOLIC PANEL
ALT: 6 U/L (ref 0–55)
AST (SGOT): 10 U/L (ref 5–34)
Albumin/Globulin Ratio: 0.5 — ABNORMAL LOW (ref 0.9–2.2)
Albumin: 1.7 g/dL — ABNORMAL LOW (ref 3.5–5.0)
Alkaline Phosphatase: 108 U/L — ABNORMAL HIGH (ref 37–106)
Anion Gap: 9 (ref 5.0–15.0)
BUN: 25 mg/dL — ABNORMAL HIGH (ref 7.0–19.0)
Bilirubin, Total: 0.2 mg/dL (ref 0.2–1.2)
CO2: 28 mEq/L (ref 22–29)
Calcium: 8.1 mg/dL — ABNORMAL LOW (ref 8.5–10.5)
Chloride: 100 mEq/L (ref 100–111)
Creatinine: 4.2 mg/dL — ABNORMAL HIGH (ref 0.6–1.0)
Globulin: 3.4 g/dL (ref 2.0–3.6)
Glucose: 373 mg/dL — ABNORMAL HIGH (ref 70–100)
Potassium: 4.1 mEq/L (ref 3.5–5.1)
Protein, Total: 5.1 g/dL — ABNORMAL LOW (ref 6.0–8.3)
Sodium: 137 mEq/L (ref 136–145)

## 2019-05-20 LAB — GLUCOSE WHOLE BLOOD - POCT
Whole Blood Glucose POCT: 365 mg/dL — ABNORMAL HIGH (ref 70–100)
Whole Blood Glucose POCT: 254 mg/dL — ABNORMAL HIGH (ref 70–100)
Whole Blood Glucose POCT: 156 mg/dL — ABNORMAL HIGH (ref 70–100)
Whole Blood Glucose POCT: 179 mg/dL — ABNORMAL HIGH (ref 70–100)
Whole Blood Glucose POCT: 227 mg/dL — ABNORMAL HIGH (ref 70–100)

## 2019-05-20 LAB — HEMOLYSIS INDEX: Hemolysis Index: 11 (ref 0–18)

## 2019-05-20 LAB — CBC
Absolute NRBC: 0 10*3/uL (ref 0.00–0.00)
Hematocrit: 32.5 % — ABNORMAL LOW (ref 34.7–43.7)
Hgb: 9.8 g/dL — ABNORMAL LOW (ref 11.4–14.8)
MCH: 29.3 pg (ref 25.1–33.5)
MCHC: 30.2 g/dL — ABNORMAL LOW (ref 31.5–35.8)
MCV: 97 fL — ABNORMAL HIGH (ref 78.0–96.0)
MPV: 11.6 fL (ref 8.9–12.5)
Nucleated RBC: 0 /100 WBC (ref 0.0–0.0)
Platelets: 195 10*3/uL (ref 142–346)
RBC: 3.35 10*6/uL — ABNORMAL LOW (ref 3.90–5.10)
RDW: 14 % (ref 11–15)
WBC: 8.21 10*3/uL (ref 3.10–9.50)

## 2019-05-20 LAB — GFR: EGFR: 12.9

## 2019-05-20 MED ORDER — AMLODIPINE BESYLATE 5 MG PO TABS
5.0000 mg | ORAL_TABLET | Freq: Two times a day (BID) | ORAL | Status: DC
Start: 2019-05-20 — End: 2019-07-28

## 2019-05-20 MED ORDER — INSULIN GLARGINE 100 UNIT/ML SC SOLN
15.00 [IU] | Freq: Every evening | SUBCUTANEOUS | Status: DC
Start: 2019-05-20 — End: 2019-05-26

## 2019-05-20 MED ORDER — POTASSIUM CHLORIDE CRYS ER 20 MEQ PO TBCR
40.00 meq | EXTENDED_RELEASE_TABLET | Freq: Two times a day (BID) | ORAL | 0 refills | Status: DC
Start: 2019-05-20 — End: 2019-05-21

## 2019-05-20 MED ORDER — PANTOPRAZOLE SODIUM 40 MG PO TBEC
40.00 mg | DELAYED_RELEASE_TABLET | Freq: Two times a day (BID) | ORAL | Status: DC
Start: 2019-05-20 — End: 2020-10-20

## 2019-05-20 NOTE — PT Progress Note (Signed)
Physical Therapy Note  [facility]  Physical Therapy Attempt Note    Patient:  [name] MRN#:  [id number]  Unit:  [place] Room/Bed:  A2315/A2315.01      Physical Therapy treatment attempted on 05/20/2019 at 4:41 PM unable to complete secondary to patient declining 2/2 to not feeling well (headache, tiredness). Therapist will reattempt as time permit.    Jake Seats, PT  05/20/2019 4:44 PM

## 2019-05-20 NOTE — Progress Notes (Signed)
Pt is alert and oriented. Pt denied pain and sob. PD access is optimally working. CCPD completed without any problem. Total UF of 1130ml.  Timeouts and safety checks done. Reports given to Primary RN.       05/20/19 3545   Completing APD/CCPD (Taking the Patient OFF)    Effluent Appearance Clear   Specimen Collected No   Last Fill Volume (mL) 2500 mL   Volume Collected in Drainage Bag (mL) 1149 mL   Peritoneal Dialysis Comments CCPD completed   Peritoneal Dialysis Catheter   No Placement Date or Time found.   Present on Admission?: Yes  Catheter Type: Baxter   Site Assessment Clean;Dry;Intact   Status Clamped   Site Condition No complications   Dressing Status Clean;Dry;Intact   Dressing Occlusive

## 2019-05-20 NOTE — Discharge Summary (Signed)
[facility]      Patient: [name]  Admission Date: 05/12/2019   DOB: Dec 28, 1955  Discharge Date: 05/20/2019    MRN: [id number]  Discharge Attending:Amine Adelson D Ural Acree     Referring Physician: No primary care provider on file.  PCP: No primary care provider on file.       DISCHARGE SUMMARY     Discharge Information   Admission Diagnosis:   AKI (acute kidney injury)    Discharge Diagnosis:   Active Hospital Problems    Diagnosis    Iron deficiency anemia secondary to inadequate dietary iron intake    Sideropenic dysphagia    AKI (acute kidney injury)        Admission Condition: Guarded   Discharge Condition: Stable   Consultants: GI, Nephrology   Functional Status: Ambulatory   Discharged to: SNF     Discharge Medications:     Medication List      START taking these medications    amLODIPine 5 MG tablet  Commonly known as: NORVASC  Take 1 tablet (5 mg total) by mouth every 12 (twelve) hours     insulin glargine 100 UNIT/ML injection  Commonly known as: LANTUS  Inject 15 Units into the skin nightly     pantoprazole 40 MG tablet  Commonly known as: PROTONIX  Take 1 tablet (40 mg total) by mouth 2 (two) times daily     potassium chloride 20 MEQ tablet  Commonly known as: KLOR-CON  Take 2 tablets (40 mEq total) by mouth 2 (two) times daily for 4 days        CONTINUE taking these medications    aspirin EC 81 MG EC tablet     atorvastatin 40 MG tablet  Commonly known as: LIPITOR     carvedilol 25 MG tablet  Commonly known as: COREG     cilostazol 100 MG tablet  Commonly known as: PLETAL     famotidine 40 MG tablet  Commonly known as: PEPCID     furosemide 40 MG tablet  Commonly known as: LASIX     gabapentin 600 MG tablet  Commonly known as: NEURONTIN     insulin aspart 100 UNIT/ML injection  Commonly known as: NovoLOG     oxyCODONE-acetaminophen 5-325 MG per tablet  Commonly known as: PERCOCET     sevelamer 800 MG tablet  Commonly known as: RENVELA        STOP taking these medications     amLODIPine-atorvastatin 10-10 MG per tablet  Commonly known as: CADUET           Where to Get Your Medications      These medications were sent to [facility], [address] [place], [place] [address]    Phone: [phone]    potassium chloride 20 MEQ tablet     Information about where to get these medications is not yet available    Ask your nurse or doctor about these medications   amLODIPine 5 MG tablet   insulin glargine 100 UNIT/ML injection   pantoprazole 40 MG tablet             Hospital Course   Presentation History   Per Dr Ann Maki "[name] is a 64 y.o. female with history of hypertension, hyperlipidemia, insulin-dependent diabetes mellitus, COPD, end-stage renal disease on peritoneal dialysis who presents to the emergency department for evaluation of a 3-day history of intractable nausea,  vomiting and nonbloody diarrhea.  She states that she ate an ultimate omelette at [facility] on 05/09/19 and following that her symptoms started.  She complains of constant diffuse abdominal pain described as aching pain, reaching 10 out of 10 in severity which is exacerbated by movement.  She also reports a fever 102 last night along with chills, feeling dizzy and near syncope, generalized weakness. She reports no history of renal stones.  She does report having a UTI in the past.  She states that she had noted blood in one of her episodes of emesis.  She states that she was hospitalized 1 month ago at [facility] where she had an upper endoscopy but she cannot recall the results.  States that she was started on hemodialysis last May 2020 and after that she was transitioned to peritoneal dialysis at home.  She states that she intermittently makes urine and last week she had frequent urination.  She reports no headache, visual changes, syncope, loss of consciousness, hematochezia, melena, dysuria, hematuria, numbness/tingling, focal weakness.    In the ED, patient was hypertensive on presentation, afebrile, not tachycardic or hypoxic.  Labs significant for glucose of 551, creatinine 4.7.  She received 3 L normal saline bolus and vancomycin and Zosyn in the ED    See HPI for details.    Hospital Course (8 Days)   [name] a 64 y.o.femalewith history ofhypertension, hyperlipidemia, insulin-dependent diabetes mellitus, COPD, end-stage renal disease on peritoneal dialysiswho presents to the emergency department for evaluation of a 3-day history of intractable nausea,vomiting and nonbloody diarrhea, admitted for viral gastroenteritis and UTI.    #UGIB - Patient had 1 episode of 400 cc coffee ground emesis on 4/20 per RN  #Possible melena per patient report  -s/p EGD on 4/21: Grade D gastritis  -Continue Protonix 40 mg BID  -Ok to restart ASA and pletal per GI  -H/H stable   -GI recommendations appreciated. Outpatient follow-up for repeat EGD.    Chest pain  -EKG NSR, troponin x2 negative.  -D dimer elevated, CTA chest negative for PE.     #GNR Peritonitis, pansensitive   #Abdominal pain  #Nausea, vomiting, diarrhea - Resolved  #UTI  -Likelydue toviral gastroenteritis/UTI/Peritonitis  -Peritoneal fluid with 51,000 WBC's. Improved to 200s   -Blood cultures NGTD, lactic acid normal and lipase normal  -Urine culture: >100,000 CFU/ML of multiple bacterial morphotypes   -CT abd/pelvis wo contrastrevealsno evidence of bowel obstruction, bowel ischemia, perforation, diverticulitis, pancreatitis, nephrolithiasis and shows intraperitoneal fluid  -PRN antiemetics for nausea/vomiting  -Continue IP cefazolin with PD exchanges. Nephrology will be placing orders for this.   -ID recommendations appreciated    #Hypokalemia  #Hypomagnesemia  -Resolved     #ESRD on peritoneal dialysis  -Started on dialysis on 05/2018  -CT abd/pelvis shows moderate amount of ascites ? Dialysis fluid  -Under care everywhere, noted that Cr from 10/2018-11/2018 has been  3.8-5 range  -Continue PD with IP antibiotics  -Repeat cell count per nephrology   -Nephrology following    #Hypertension  -Continue home amlodipine, coreg    #Hyperglycemia  #Type 2 DM  -Continue home lantus, SSI, ac/hs accuchecks  -HbA1c 9.8%    #Hyperlipidemia   -Continue home statin    #Iron deficiency anemia  #Anemia of Chronic disease   -Completed IV Venofer 3/3  -S/p 1 unit pRBC transfusion     [facility] instructions given     Return instructions given     Procedures/Imaging:   CT Angiogram Chest  Result Date: 05/18/2019     No evidence of pulmonary embolism. Small bilateral pleural effusions. Large hiatal hernia. Trace perihepatic ascites. Other nonurgent findings as above. Lonia Skinner, MD  05/18/2019 9:58 AM           Best Practices   Was the patient admitted with either a CHF Exacerbation or Pneumonia? No     Progress Note/Physical Exam at Discharge     Subjective: Pt seen and examined at bedside.   Denies cp, sob.   Denies abdominal pain, nausea/vomiting.   Ready to go to rehab.     Vitals:    05/19/19 2331 05/20/19 0314 05/20/19 0513 05/20/19 0747   BP: 144/81 156/89  147/83   Pulse: 62 63  60   Resp: 20 20  18    Temp: 98.8 F (37.1 C) 99 F (37.2 C)  99.1 F (37.3 C)   TempSrc: Oral Oral  Oral   SpO2: 97% 99%  98%   Weight:   121.3 kg (267 lb 6.7 oz)    Height:           General: NAD, AAOx3  HEENT: perrla, eomi, sclera anicteric, OP: Clear, MMM  Neck: supple, FROM, no LAD  Cardiovascular: RRR, no m/r/g  Lungs: CTAB, no w/r/r  Abdomen: soft, +BS, NT/ND, no masses, no g/r  Extremities: no C/C/E  Skin: no rashes or lesions noted  Neuro: non focal, moves all extremities        Diagnostics     Labs/Studies Pending at Discharge: No    Last Labs   Recent Labs   Lab 05/20/19  0500 05/17/19  0936 05/16/19  0354   WBC 8.21 9.39 6.53   RBC 3.35* 3.21* 2.37*   Hgb 9.8* 9.3* 6.9*   Hematocrit 32.5* 30.7* 23.3*   MCV 97.0* 95.6 98.3*   Platelets 195 145 134*       Recent Labs   Lab 05/20/19  0323 05/18/19  0844  05/17/19  1337 05/17/19  0939 05/16/19  0354 05/15/19  0957   Sodium 137 140  --  141 138 139   Potassium 4.1 4.2  --  3.0* 3.1* 3.6   Chloride 100 102  --  103 100 103   CO2 28 32*  --  28 28 27    BUN 25.0* 25.0*  --  24.0* 30.0* 33.0*   Creatinine 4.2* 4.4*  --  4.3* 4.5* 4.7*   Glucose 373* 255*  --  164* 409* 256*   Calcium 8.1* 7.8*  --  7.4* 7.2* 7.4*   Magnesium  --   --  1.8 1.7 1.4*  --        Microbiology Results     Procedure Component Value Units Date/Time    Blood Culture Aerobic/Anaerobic #1 [[id number]] Collected: 05/12/19 1855    Specimen: Arm from Blood, Venipuncture Updated: 05/18/19 0021    Narrative:      ORDER#: [id number]                                    ORDERED BY: Dwyane Dee, VINOD  SOURCE: Blood, Venipuncture Arm                      COLLECTED:  05/12/19 18:55  ANTIBIOTICS AT COLL.:  RECEIVED :  05/12/19 21:53  Culture Blood Aerobic and Anaerobic        FINAL       05/18/19 00:21  05/18/19   No growth after 5 days of incubation.      Blood Culture Aerobic/Anaerobic #2 [[id number]] Collected: 05/12/19 1855    Specimen: Arm from Blood, Venipuncture Updated: 05/18/19 0021    Narrative:      ORDER#: [id number]                                    ORDERED BY: Dwyane Dee, VINOD  SOURCE: Blood, Venipuncture Arm                      COLLECTED:  05/12/19 18:55  ANTIBIOTICS AT COLL.:                                RECEIVED :  05/12/19 21:53  Culture Blood Aerobic and Anaerobic        FINAL       05/18/19 00:21  05/18/19   No growth after 5 days of incubation.      COVID-19 (SARS-COV-2) [employer] [facility]) [[id number]] Collected: 05/12/19 1611    Specimen: Nasopharyngeal Swab from Nasopharynx Updated: 05/12/19 1648     Purpose of COVID testing Screening     SARS-CoV-2 Specimen Source Nasopharyngeal     SARS CoV 2 Overall Result Negative     Comment: Test performed using the Abbott ID NOW EUA assay.  Please see Fact Sheets for patients and providers located  at:  http://olson-hall.info/  This test is for the qualitative detection of SARS-CoV-2  (COVID19) nucleic acid. Viral nucleic acids may persist in vivo,  independent of viability. Detection of viral nucleic acid does  not imply the presence of infectious virus, or that virus  nucleic acid is the cause of clinical symptoms. Negative  results should be treated as presumptive and, if inconsistent  with clinical signs and symptoms or necessary for patient  management, should be tested with an alternative molecular  assay. Negative results do not preclude SARS-CoV-2 infection  and should not be used as the sole basis for patient  management decisions. Invalid results may be due to inhibiting  substances in the specimen and recollection should occur.         Narrative:      o Collect and clearly label specimen type:  o Upper respiratory specimen: One Nasopharyngeal Dry Swab NO  Transport Media.  o Hand deliver to laboratory ASAP  Indication for testing->Extended care facility admission to  semi private room    Culture + Gram Lenise Arena, Body Fluid [[id number]] Collected: 05/13/19 1447    Specimen: Body Fluid from Ascites Fluid Updated: 05/15/19 1818    Narrative:      [id number]_ called Micro Results of fluid. Results read back [id number], by [id number]  on 05/15/2019 at 18:17  ORDER#: [id number]                                    ORDERED BY: RANA, IRMINDRA  SOURCE: Ascites Fluid ascites                        COLLECTED:  05/13/19 14:47  ANTIBIOTICS AT COLL.:  RECEIVED :  05/13/19 19:33  [id number]_ called Micro Results of fluid. Results read back [id number], by [id number] on 05/15/2019 at 18:17  Stain, Gram                                FINAL       05/13/19 21:48  05/13/19   Many WBCs             No organisms seen  Culture and Gram Stain, Aerobic, Body FluidFINAL       05/15/19 18:18   +  05/15/19   Very light growth of Escherichia  coli      _____________________________________________________________________________                                     E.coli       ANTIBIOTICS                     MIC  INTRP      _____________________________________________________________________________  Amoxicillin/CA                  8/4    S        Ampicillin                      >16    R        Ampicillin/sulbactam           16/8    I        Aztreonam                       <=2    S        Cefazolin                        2     S        Cefepime                        <=1    S        Cefoxitin                       <=4    S        Ceftazidime                     <=2    S        Ceftriaxone                     <=1    S        Cefuroxime                      <=4    S        Ciprofloxacin                 <=0.25   S        Ertapenem                     <=0.25   S        Gentamicin                      <=  2    S        Levofloxacin                   <=0.5   S        Meropenem                      <=0.5   S        Piperacillin/Tazobactam        <=2/4   S        Tetracycline                    >8     R        Trimethoprim/Sulfamethoxazole  >2/38   R        _____________________________________________________________________________            S=SUSCEPTIBLE     I=INTERMEDIATE     R=RESISTANT                            N/S=NON-SUSCEPTIBLE  _____________________________________________________________________________      Urine culture [[id number]] Collected: 05/12/19 1611    Specimen: Bladder Urine Updated: 05/14/19 0909    Narrative:      Replace urinary catheter prior to obtaining the urine culture  if it has been in place for greater than or equal to 14  days:->N/A No Foley  Indications for U/A Reflex to Micro - Reflex to  Culture:->Suprapubic Pain/Tenderness or Dysuria  ORDER#: [id number]                                    ORDERED BY: HAILE, LYDIA  SOURCE: Urine                                        COLLECTED:  05/12/19 16:11  ANTIBIOTICS AT COLL.:                                 RECEIVED :  05/12/19 16:26  Culture Urine                              FINAL       05/14/19 09:09  05/14/19   >100,000 CFU/ML of multiple bacterial morphotypes present.             Possible contamination, appropriate recollection is             requested if clinically indicated.             Patient Instructions   Discharge Diet: renal diet   Discharge Activity: as tolerated     Follow Up Appointment:  Follow-up Information     Ronie Spies, MD Follow up.    Specialties: Gastroenterology, Internal Medicine  Contact information:  [address]  [place] [address]  [phone]                    Time spent examining patient, discussing with patient/family regarding hospital course, chart review, reconciling medications and discharge planning: 45 minutes.    [name]    10:05 AM 05/20/2019

## 2019-05-20 NOTE — Progress Notes (Signed)
Alert and oriented X 4, medicated as ordered, no acute distress noted, remain afebrile. Denies SOB, on RA, denies chest pain.  Tolerated meal, no complain of nausea or vomiting. No diabetes related complication noted. Assisted out of bed and up in chair, ambulates with a walker to the bathroom, reported bowel movements X 1 during shift, with diminished urine output. Fall  and safety precautions maintained, educated to call for help when needed, call bell and belongings within reach.

## 2019-05-20 NOTE — Progress Notes (Signed)
[facility] PROGRESS NOTE  Aaron Edelman, x [phone] [facility] Spectralink)      Date Time: 05/20/19 10:11 AM  Patient Name: [name]  Attending Physician: Ardeth Sportsman, MD    CC: follow-up ESRD  PD note    Assessment:     1. PD associated peritonitis - E Coli  - cell count improving  - no need to remove catheter  2. ESRD- on CAPD  3. Anemia- CKD + Fe deficiency now exacerbated by GI bleed  4. Volume overload  5. Hypokalemia  6. Hypomagnesemia - resolved        Recommendations:   1.Continue KCL today  2. Will need abx for a total of 3 weeks  3.Spoke to attending physician,will notify us when facility has accepted patient to make sure PD is set up for her   4.PDas ordered today   5. Monitor BMs    Overall stable from renal standpoint     Case discussed with:  Pt, HD-RN, attending physician    Verl Blalock, MD   [facility]  [phone] (office)  X [phone] ([place])      Subjective:   Somnolent    Review of Systems:   Cardio: no chest pain, no edema  Pulm: no SOB  Abd: no tenderness to palpation      Physical Exam:     Vitals:    05/19/19 2331 05/20/19 0314 05/20/19 0513 05/20/19 0747   BP: 144/81 156/89  147/83   Pulse: 62 63  60   Resp: 20 20  18    Temp: 98.8 F (37.1 C) 99 F (37.2 C)  99.1 F (37.3 C)   TempSrc: Oral Oral  Oral   SpO2: 97% 99%  98%   Weight:   121.3 kg (267 lb 6.7 oz)    Height:           Intake and Output Summary (Last 24 hours) at Date Time    Intake/Output Summary (Last 24 hours) at 05/20/2019 1011  Last data filed at 05/20/2019 0981  Gross per 24 hour   Intake 1210 ml   Output -1351 ml   Net 2561 ml       General: obese female, somnolent  Cardiovascular: regular rate and rhythm  Lungs: clear to auscultation bilaterally, bilateral air entry,normal work of breathing  Abdomen: soft, NT,ND;LLQ PD catheter  Extremities: improved edema      Meds:      Scheduled Meds: PRN Meds:    amLODIPine, 5 mg, Oral, Q12H  atorvastatin, 40 mg, Oral,  Daily  carvedilol, 25 mg, Oral, Q12H SCH  darbepoetin alfa, 60 mcg, Subcutaneous, Weekly  peritoneal dialysis with additives, , Peritoneal catheter, Q24H  famotidine, 10 mg, Oral, Q12H SCH  insulin glargine, 15 Units, Subcutaneous, QHS  insulin lispro, 1-4 Units, Subcutaneous, QHS  insulin lispro, 1-8 Units, Subcutaneous, TID AC  insulin lispro, 3 Units, Subcutaneous, TID MEALS  lactulose, 30 g, Oral, Q8H SCH  pantoprazole, 40 mg, Oral, BID  potassium chloride, 40 mEq, Oral, BID  sevelamer, 800 mg, Oral, TID MEALS          Continuous Infusions:   sodium chloride 100 mL/hr at 05/14/19 1828    dianeal low calcium (2.5 mEq/L) 4.25% dextrose      dianeal PD-2 (calcium 3.5 mEq/L) 2.5% dextrose      lactated ringers Stopped (05/15/19 1409)    sodium chloride, , PRN  bisacodyl, 10 mg, Daily PRN  dextrose, 15 g of glucose, PRN  And  dextrose, 12.5 g, PRN   And  glucagon (rDNA), 1 mg, PRN  naloxone, 0.2 mg, PRN  ondansetron, 4 mg, Q6H PRN   Or  ondansetron, 4 mg, Q6H PRN              Labs:     Recent Labs   Lab 05/20/19  0500 05/17/19  0936 05/16/19  0354   WBC 8.21 9.39 6.53   Hgb 9.8* 9.3* 6.9*   Hematocrit 32.5* 30.7* 23.3*   Platelets 195 145 134*     Recent Labs   Lab 05/20/19  0323 05/18/19  0844 05/17/19  1337 05/17/19  0939 05/16/19  0354 05/16/19  0354 05/15/19  0957 05/14/19  0428   Sodium 137 140  --  141  More results in Results Review 138  More results in Results Review 142   Potassium 4.1 4.2  --  3.0*  More results in Results Review 3.1*  More results in Results Review 4.0   Chloride 100 102  --  103  More results in Results Review 100  More results in Results Review 106   CO2 28 32*  --  28  More results in Results Review 28  More results in Results Review 27   BUN 25.0* 25.0*  --  24.0*  More results in Results Review 30.0*  More results in Results Review 40.0*   Creatinine 4.2* 4.4*  --  4.3*  More results in Results Review 4.5*  More results in Results Review 5.1*   Calcium 8.1* 7.8*  --  7.4*  More  results in Results Review 7.2*  More results in Results Review 7.5*   Albumin 1.7* 1.4*  --  1.4*  More results in Results Review 1.5*  More results in Results Review 1.6*   Phosphorus  --  3.6  --  3.8  --   --   --  4.2   Magnesium  --   --  1.8 1.7  --  1.4*  --   --    Glucose 373* 255*  --  164*  More results in Results Review 409*  More results in Results Review 87   EGFR 12.9 12.2  --  12.5  More results in Results Review 11.9  More results in Results Review 10.3   More results in Results Review = values in this interval not displayed.           Signed by: Verl Blalock, MD

## 2019-05-20 NOTE — Plan of Care (Signed)
Problem: Renal Instability  Goal: Fluid and electrolyte balance are achieved/maintained  Outcome: Progressing  Flowsheets (Taken 05/20/2019 0829)  Fluid and electrolyte balance are achieved/maintained: Monitor/assess lab values and report abnormal values  Goal: Free from infection  Outcome: Progressing  Flowsheets (Taken 05/20/2019 0829)  Free from infection: Monitor/assess for signs and symptoms of infection     Problem: Patient Receiving Advanced Renal Therapies  Goal: Therapy access site remains intact  Outcome: Progressing  Flowsheets (Taken 05/20/2019 0829)  Therapy access site remains intact: Assess therapy access site

## 2019-05-20 NOTE — Progress Notes (Addendum)
Left VM for [facility] and [facility] admissions re: bed availability and PD accomodations.         Spoke with [facility]. They do not provide PD.     CM called [facility] to f/u. Per front desk, they have left for the day. Will f/u tomorrow.            Dede Query, RN MSN       Clinical Case Manager       [facility]        [address], [place], [place] [address]      [email or web address]      [phone]

## 2019-05-20 NOTE — Plan of Care (Signed)
Problem: Safety  Goal: Patient will be free from injury during hospitalization  Outcome: Progressing  Flowsheets (Taken 05/20/2019 1749)  Patient will be free from injury during hospitalization:   Assess patient's risk for falls and implement fall prevention plan of care per policy   Provide and maintain safe environment   Use appropriate transfer methods   Ensure appropriate safety devices are available at the bedside   Include patient/ family/ care giver in decisions related to safety   Hourly rounding  Goal: Patient will be free from infection during hospitalization  Outcome: Progressing  Flowsheets (Taken 05/17/2019 1837)  Free from Infection during hospitalization:   Assess and monitor for signs and symptoms of infection   Monitor all insertion sites (i.e. indwelling lines, tubes, urinary catheters, and drains)   Monitor lab/diagnostic results   Encourage patient and family to use good hand hygiene technique     Problem: Pain  Goal: Pain at adequate level as identified by patient  Outcome: Progressing  Flowsheets (Taken 05/17/2019 1837)  Pain at adequate level as identified by patient:   Identify patient comfort function goal   Assess for risk of opioid induced respiratory depression, including snoring/sleep apnea. Alert healthcare team of risk factors identified.   Assess pain on admission, during daily assessment and/or before any "as needed" intervention(s)   Reassess pain within 30-60 minutes of any procedure/intervention, per Pain Assessment, Intervention, Reassessment (AIR) Cycle     Problem: Compromised Tissue integrity  Goal: Nutritional status is improving  Outcome: Progressing  Flowsheets (Taken 05/20/2019 1749)  Nutritional status is improving:   Assist patient with eating   Allow adequate time for meals   Encourage patient to take dietary supplement(s) as ordered     Problem: Renal Instability  Goal: Fluid and electrolyte balance are achieved/maintained  Outcome: Progressing  Flowsheets (Taken  05/20/2019 1749)  Fluid and electrolyte balance are achieved/maintained:   Monitor intake and output every shift   Monitor/assess lab values and report abnormal values   Assess for confusion/personality changes   Monitor daily weight   Provide adequate hydration   Assess and reassess fluid and electrolyte status   Observe for seizure activity and initiate seizure precautions if indicated     Problem: Altered GI Function  Goal: Elimination patterns are normal or improving  Outcome: Progressing  Flowsheets (Taken 05/20/2019 1749)  Elimination patterns are normal or improving:   Report abnormal assessment to physician   Anticipate/assist with toileting needs   Assess for normal bowel sounds   Monitor for abdominal distension  Goal: Mobility/Activity is maintained at optimal level for patient  Outcome: Progressing  Flowsheets (Taken 05/19/2019 1909)  Mobility/activity is maintained at optimal level for patient:   Increase mobility as tolerated/progressive mobility   Encourage independent activity per ability   Perform active/passive ROM   Maintain proper body alignment   Plan activities to conserve energy, plan rest periods   Reposition patient every 2 hours and as needed unless able to reposition self

## 2019-05-20 NOTE — OT Progress Note (Signed)
Occupational Therapy Note  [facility]  Occupational Therapy Treatment     Patient: [name]     MRN#: [id number]   Unit: [address] [place]  Bed: A2315/A2315.01    Time of treatment:  Time Calculation  OT Received On: 05/20/19  Start Time: 1243  Stop Time: 1321  Time Calculation (min): 38 min             Chart Review and Collaboration with Care Team: 3 minutes, not included in above time.    OT Visit Number: 1    Precautions and Contraindications:    Precautions  Weight Bearing Status: no restrictions  Other Precautions: falls    Personal Protective Equipment (PPE)  gloves, procedure mask and eye shield/covering    Updated Labs:  Lab Results   Component Value Date/Time    HGB 9.8 (L) 05/20/2019 05:00 AM    HCT 32.5 (L) 05/20/2019 05:00 AM    K 4.1 05/20/2019 03:23 AM    NA 137 05/20/2019 03:23 AM    INR 0.9 03/18/2006 03:00 PM    TROPI 0.04 05/17/2019 06:20 PM    TROPI 0.05 05/17/2019 01:37 PM    TROPI 0.01 03/18/2006 06:10 PM       All imaging reviewed, please see chart for details.    Subjective:    .  "I don't feel good- I just feel off."       Patient's medical condition is appropriate for Occupational Therapy intervention at this time.  Patient is agreeable to participation in the therapy session. Nursing clears patient for therapy.  Pain Assessment  Pain Assessment: No/denies pain      Objective:  Observation of Patient/Vital Signs:    Stable    Cognition/Neuro Status  Arousal/Alertness: Appropriate responses to stimuli  Attention Span: Appears intact  Orientation Level: Oriented X4  Memory: Appears intact  Following Commands: Follows all commands and directions without difficulty  Safety Awareness: minimal verbal instruction  Insights: Educated in Surveyor, mining  Problem Solving: Assistance required to identify errors made  Behavior: calm;cooperative  Motor Planning: intact  Coordination: intact    Functional Mobility  Supine to Sit Transfers: Stand by Assist  Sit to  Stand Transfers: Contact Guard Assist(to FWW with cues for hand placement)  Functional Mobility/Ambulation: Stand by Assist(amb X 10 ft to toilet with FWW)    Self-care and Home Management  Eating: Independent;in a chair  Grooming: Modified Independent;standing at sink(brush teeth)  Bathing: Moderate Assist  UB Dressing: Stand by Assist  LB Dressing: (pt reports able to donn clothing with sock aide and reacher)  Toileting: Supervision  Functional Transfers: Contact Guard Assist;Stand by Assist     Pt completed bathing at edge of bed, changed gown, amb to bathroom w FWW and brushed teeth, toilet transfer and peri care.    Attempted X 5 steps with hand held assist- pt unsteady- needing min assist - does better with FWW                               Educated the patient to role of occupational therapy, plan of care, goals of therapy and HEP, safety with mobility and ADLs, home safety with verbalized understanding.     Patient left in bedside chair with all medical equipment in place and call bell and all personal items/needs within reach (of note, pt received without alarm in place).  RN notified of session outcome.  Assessment:  Pt making progress toward goals set, pt is unsteady without use of walker, encouraged to get out of bed to chair to prevent further deconditioning/weakness.  Would benefit from short stay at SNF for maximize functional mobility, ADL's, and endurance/activity tolerance.      PMP Activity: Step 6 - Walks in Room  Distance Walked (ft) (Step 6,7): (15 ft)      Plan:  Goal Formulation: Patient      Time For Goal Achievement: 5 visits  ADL Goals  Patient will groom self: Supervision, at sinkside, Goal met  Patient will dress lower body: Stand by Assist, with AE, Partly met  Patient will toilet: Supervision, Goal met  Mobility and Transfer Goals  Pt will perform functional transfers: Supervision, with rolling walker, Partly met                       Continue plan of care.       DME Recommended  for Discharge: Front wheel walker, Tub transfer bench, Reacher(pt has cane, sock aide)  Discharge Recommendation: SNF(may progress to home with supv and HHOT/HHPT)      Mickel Baas Marilynne Dupuis OTR/L   05/20/2019 1:32 PM

## 2019-05-20 NOTE — Plan of Care (Signed)
Discharge Recommendation: SNF(may progress to home with supv and HHOT/HHPT)   DME Recommended for Discharge: Front wheel walker, Tub transfer bench, Reacher(pt has cane, sock aide)    OT Frequency Recommended: 2-3x/wk     Is PT evaluation indicated at this time? This patient is already on PT caseload.     PMP Activity: Step 6 - Walks in Room  Distance Walked (ft) (Step 6,7): (15 ft)  (Please See Therapy Evaluation for device and assistance level needed)    Treatment Interventions: ADL retraining, Functional transfer training, Patient/Family training, Equipment eval/education, Compensatory technique education, Continued evaluation       Goal Formulation: Patient  Time For Goal Achievement: 5 visits  ADL Goals  Patient will groom self: Supervision, at sinkside, Goal met  Patient will dress lower body: Stand by Assist, with AE, Partly met  Patient will toilet: Supervision, Goal met  Mobility and Transfer Goals  Pt will perform functional transfers: Supervision, with rolling walker, Partly met

## 2019-05-20 NOTE — UM Notes (Signed)
Patient discharged on 4.2y to SNF. Discharge planner is working on a viable SNF bed.     Discharge Recommendation: SNF(may progress to home with supv and HHOT/HHPT)   DME Recommended for Discharge: Front wheel walker, Tub transfer bench, Reacher(pt has cane, sock aide)    Nephrology note  Assessment:     1. PD associated peritonitis - E Coli  - cell count improving  - no need to remove catheter  2. ESRD- on CAPD  3. Anemia- CKD + Fe deficiency now exacerbated by GI bleed  4. Volume overload  5. Hypokalemia  6. Hypomagnesemia - resolved        Recommendations:   1.Continue KCL today  2. Will need abx for a total of 3 weeks  3.Spoke to attending physician,will notify us when facility has accepted patient to make sure PD is set up for her   4.PDas ordered today   5. Monitor BMs    Discharge Date: 05/20/2019     Admission Diagnosis:   AKI (acute kidney injury)    Discharge Diagnosis:       Active Hospital Problems    Diagnosis    Iron deficiency anemia secondary to inadequate dietary iron intake    Sideropenic dysphagia    AKI (acute kidney injury)        Admission Condition: Guarded   Discharge Condition: Stable   Consultants: GI, Nephrology   Functional Status: Ambulatory   Discharged to: SNF       BP 130/75    Pulse (!) 59    Temp 98.6 F (37 C) (Oral)    Resp 18    Ht 1.575 m (5' 2.01")    Wt 121.3 kg (267 lb 6.7 oz)    LMP  (LMP Unknown) Comment: post menopausal   SpO2 98%    BMI 48.90 kg/m     Scheduled Meds:  Current Facility-Administered Medications   Medication Dose Route Frequency    amLODIPine  5 mg Oral Q12H    atorvastatin  40 mg Oral Daily    carvedilol  25 mg Oral Q12H SCH    darbepoetin alfa  60 mcg Subcutaneous Weekly    peritoneal dialysis with additives   Peritoneal catheter Q24H    famotidine  10 mg Oral Q12H [place]    insulin glargine  15 Units Subcutaneous QHS    insulin lispro  1-4 Units Subcutaneous QHS    insulin lispro  1-8 Units Subcutaneous TID AC    insulin lispro  3  Units Subcutaneous TID MEALS    lactulose  30 g Oral Q8H Ce[place]    pantoprazole  40 mg Oral BID    potassium chloride  40 mEq Oral BID    sevelamer  800 mg Oral TID MEALS     Continuous Infusions:   sodium chloride 100 mL/hr at 05/14/19 1828    dianeal low calcium (2.5 mEq/L) 4.25% dextrose      dianeal PD-2 (calcium 3.5 mEq/L) 2.5% dextrose      lactated ringers Stopped (05/15/19 1409)     PRN Meds:.sodium chloride, bisacodyl, Nursing communication: Adult Hypoglycemia Treatment Algorithm **AND** dextrose **AND** dextrose **AND** glucagon (rDNA), naloxone, ondansetron **OR** ondansetron    UTILIZATION REVIEW CONTACT: Name: Laurice Record, RN, BSN  Clinical Case Manager  - Utilization Review  NPI:   [id number]  Tax ID:  [id number]  Phone: [phone]  Fax: [phone]  Email: [email or web address]

## 2019-05-20 NOTE — Plan of Care (Signed)
Patient AO X 4, VSS no complain of pain or discomfort. Pt on RA, no SOB, denies chest pain, no fever,nausea or vomiting during this shift. Tele monitoring in place, noted with NSR on the monitor. Acucheck done, sliding scale and long acting  insulin administered as ordered.   Fall bundle in place, encouraged patient to call for assistance as needed.   Purposeful rounding done.    Problem: Safety  Goal: Patient will be free from injury during hospitalization  Flowsheets (Taken 05/20/2019 0259)  Patient will be free from injury during hospitalization:   Assess patient's risk for falls and implement fall prevention plan of care per policy   Provide and maintain safe environment   Ensure appropriate safety devices are available at the bedside   Use appropriate transfer methods   Hourly rounding   Include patient/ family/ care giver in decisions related to safety     Problem: Renal Instability  Goal: Fluid and electrolyte balance are achieved/maintained  Flowsheets (Taken 05/20/2019 0258)  Fluid and electrolyte balance are achieved/maintained:   Monitor intake and output every shift   Monitor/assess lab values and report abnormal values   Assess for confusion/personality changes   Assess and reassess fluid and electrolyte status   Provide adequate hydration     Problem: Altered GI Function  Goal: Elimination patterns are normal or improving  Flowsheets (Taken 05/20/2019 0259)  Elimination patterns are normal or improving:   Report abnormal assessment to physician   Anticipate/assist with toileting needs   Assess for normal bowel sounds   Monitor for abdominal distension

## 2019-05-20 NOTE — Progress Notes (Signed)
Pt is alert and oriented. Pt denied pain and sob. PD access is optimally working. C[employer] started without any problem. Timeouts and safety checks done. Reports given to Santiago Bumpers, RN.       05/20/19 1945   Initiating APD (Putting the Patient ON)    $APD Start Time 1945   Total Treatment Time Ordered (hours) 10 hours   Number of Cycles/Exchanges Ordered 5   Total Fill Volume (mL) 15000 mL   Fluid Orders for APD/CCPD   Cycler Bag #1 4.25%, 3.5 Calcium in 5017mL   Cycler Bag #2 2.5 %, Low Calcium in 5000 mL   Cycler Bag #3 1.5%, Low Calcium in 5000 mL   Last Fill Dianeal Solution Dextrose 1.5%   Last Fill Volume -if intermittent (mL) 2500 mL   Peritoneal Dialysis Catheter   No Placement Date or Time found.   Present on Admission?: Yes  Catheter Type: Baxter   Site Assessment Clean;Dry;Intact   Status Accessed   Site Condition No complications   Dressing Status Clean;Dry;Intact   Dressing Occlusive

## 2019-05-21 LAB — COMPREHENSIVE METABOLIC PANEL
ALT: <6 U/L (ref 0–55)
AST (SGOT): 9 U/L (ref 5–34)
Albumin/Globulin Ratio: 0.5 — ABNORMAL LOW (ref 0.9–2.2)
Albumin: 1.6 g/dL — ABNORMAL LOW (ref 3.5–5.0)
Alkaline Phosphatase: 102 U/L (ref 37–106)
Anion Gap: 9 (ref 5.0–15.0)
BUN: 27 mg/dL — ABNORMAL HIGH (ref 7.0–19.0)
Bilirubin, Total: 0.2 mg/dL (ref 0.2–1.2)
CO2: 29 mEq/L (ref 22–29)
Calcium: 8.1 mg/dL — ABNORMAL LOW (ref 8.5–10.5)
Chloride: 102 mEq/L (ref 100–111)
Creatinine: 4.6 mg/dL — ABNORMAL HIGH (ref 0.6–1.0)
Globulin: 3.2 g/dL (ref 2.0–3.6)
Glucose: 415 mg/dL — ABNORMAL HIGH (ref 70–100)
Potassium: 4.6 mEq/L (ref 3.5–5.1)
Protein, Total: 4.8 g/dL — ABNORMAL LOW (ref 6.0–8.3)
Sodium: 140 mEq/L (ref 136–145)

## 2019-05-21 LAB — CBC
Absolute NRBC: 0 10*3/uL (ref 0.00–0.00)
Hematocrit: 31.4 % — ABNORMAL LOW (ref 34.7–43.7)
Hgb: 9.3 g/dL — ABNORMAL LOW (ref 11.4–14.8)
MCH: 29.2 pg (ref 25.1–33.5)
MCHC: 29.6 g/dL — ABNORMAL LOW (ref 31.5–35.8)
MCV: 98.4 fL — ABNORMAL HIGH (ref 78.0–96.0)
MPV: 12.3 fL (ref 8.9–12.5)
Nucleated RBC: 0 /100 WBC (ref 0.0–0.0)
Platelets: 199 10*3/uL (ref 142–346)
RBC: 3.19 10*6/uL — ABNORMAL LOW (ref 3.90–5.10)
RDW: 13 % (ref 11–15)
WBC: 7.54 10*3/uL (ref 3.10–9.50)

## 2019-05-21 LAB — HEMOLYSIS INDEX
Hemolysis Index: 0 (ref 0–18)
Hemolysis Index: 2 (ref 0–18)

## 2019-05-21 LAB — BASIC METABOLIC PANEL
Anion Gap: 9 (ref 5.0–15.0)
BUN: 28 mg/dL — ABNORMAL HIGH (ref 7.0–19.0)
CO2: 29 mEq/L (ref 22–29)
Calcium: 7.9 mg/dL — ABNORMAL LOW (ref 8.5–10.5)
Chloride: 102 mEq/L (ref 100–111)
Creatinine: 4.6 mg/dL — ABNORMAL HIGH (ref 0.6–1.0)
Glucose: 414 mg/dL — ABNORMAL HIGH (ref 70–100)
Potassium: 4.5 mEq/L (ref 3.5–5.1)
Sodium: 140 mEq/L (ref 136–145)

## 2019-05-21 LAB — GFR
EGFR: 11.6
EGFR: 11.6

## 2019-05-21 LAB — GLUCOSE WHOLE BLOOD - POCT
Whole Blood Glucose POCT: 282 mg/dL — ABNORMAL HIGH (ref 70–100)
Whole Blood Glucose POCT: 102 mg/dL — ABNORMAL HIGH (ref 70–100)
Whole Blood Glucose POCT: 263 mg/dL — ABNORMAL HIGH (ref 70–100)
Whole Blood Glucose POCT: 168 mg/dL — ABNORMAL HIGH (ref 70–100)

## 2019-05-21 NOTE — Progress Notes (Signed)
[facility] PROGRESS NOTE  Aaron Edelman, x [phone] [facility])      Date Time: 05/21/19 3:39 PM  Patient Name: [name]  Attending Physician: Ardeth Sportsman, MD    CC: follow-up ESRD  PD note    Assessment:     1. PD associated peritonitis - E Coli  - cell count improving  - no need to remove catheter  2. ESRD- on CAPD  3. Anemia- CKD + Fe deficiency now exacerbated by GI bleed  4. Volume overload  5. Hypokalemia  6. Hypomagnesemia - resolved        Recommendations:   1. Stop KCL  2. Will need abx for a total of 3 weeks  3.PDtonight per orders;will continue to use 1 bag of 4.25% given edema    Case discussed with:  Pt, HD-RN, attending physician    Verl Blalock, MD   [facility]  [phone] (office)  X [phone] ([place])      Subjective:   Doing well, no BMyet    Review of Systems:   Cardio: no chest pain, no edema  Pulm: no SOB  Abd: no tenderness to palpation      Physical Exam:     Vitals:    05/21/19 0446 05/21/19 0746 05/21/19 0956 05/21/19 1145   BP: 157/82 160/84 160/84 127/74   Pulse: 63 60 60 66   Resp:  17  18   Temp: 98.1 F (36.7 C) 98.4 F (36.9 C)  98.7 F (37.1 C)   TempSrc: Oral Oral  Oral   SpO2: 98% 99%  99%   Weight:       Height:           Intake and Output Summary (Last 24 hours) at Date Time    Intake/Output Summary (Last 24 hours) at 05/21/2019 1539  Last data filed at 05/21/2019 1400  Gross per 24 hour   Intake 1030 ml   Output -1999 ml   Net 3029 ml       General: obese female, somnolent  Cardiovascular: regular rate and rhythm  Lungs: clear to auscultation bilaterally, bilateral air entry,normal work of breathing  Abdomen: soft, NT,ND;LLQ PD catheter  Extremities: improved edema      Meds:      Scheduled Meds: PRN Meds:    amLODIPine, 5 mg, Oral, Q12H  atorvastatin, 40 mg, Oral, Daily  carvedilol, 25 mg, Oral, Q12H SCH  darbepoetin alfa, 60 mcg, Subcutaneous, Weekly  peritoneal dialysis with additives, , Peritoneal catheter,  Q24H  famotidine, 10 mg, Oral, Q12H SCH  insulin glargine, 15 Units, Subcutaneous, QHS  insulin lispro, 1-4 Units, Subcutaneous, QHS  insulin lispro, 1-8 Units, Subcutaneous, TID AC  insulin lispro, 3 Units, Subcutaneous, TID MEALS  lactulose, 30 g, Oral, Q8H SCH  pantoprazole, 40 mg, Oral, BID  potassium chloride, 40 mEq, Oral, BID  sevelamer, 800 mg, Oral, TID MEALS          Continuous Infusions:   sodium chloride 100 mL/hr at 05/14/19 1828    dianeal low calcium (2.5 mEq/L) 4.25% dextrose      dianeal PD-2 (calcium 3.5 mEq/L) 2.5% dextrose      lactated ringers Stopped (05/15/19 1409)    sodium chloride, , PRN  bisacodyl, 10 mg, Daily PRN  dextrose, 15 g of glucose, PRN   And  dextrose, 12.5 g, PRN   And  glucagon (rDNA), 1 mg, PRN  naloxone, 0.2 mg, PRN  ondansetron, 4 mg, Q6H PRN  Or  ondansetron, 4 mg, Q6H PRN              Labs:     Recent Labs   Lab 05/21/19  0425 05/20/19  0500 05/17/19  0936   WBC 7.54 8.21 9.39   Hgb 9.3* 9.8* 9.3*   Hematocrit 31.4* 32.5* 30.7*   Platelets 199 195 145     Recent Labs   Lab 05/21/19  0425 05/20/19  0323 05/18/19  0844 05/17/19  1337 05/17/19  0939 05/17/19  0939 05/16/19  0354 05/16/19  0354   Sodium 140   140 137 140  --   More results in Results Review 141  More results in Results Review 138   Potassium 4.5   4.6 4.1 4.2  --   More results in Results Review 3.0*  More results in Results Review 3.1*   Chloride 102   102 100 102  --   More results in Results Review 103  More results in Results Review 100   CO2 29   29 28  32*  --   More results in Results Review 28  More results in Results Review 28   BUN 28.0*   27.0* 25.0* 25.0*  --   More results in Results Review 24.0*  More results in Results Review 30.0*   Creatinine 4.6*   4.6* 4.2* 4.4*  --   More results in Results Review 4.3*  More results in Results Review 4.5*   Calcium 7.9*   8.1* 8.1* 7.8*  --   More results in Results Review 7.4*  More results in Results Review 7.2*   Albumin 1.6* 1.7* 1.4*  --   More  results in Results Review 1.4*  More results in Results Review 1.5*   Phosphorus  --   --  3.6  --   --  3.8  --   --    Magnesium  --   --   --  1.8  --  1.7  --  1.4*   Glucose 414*   415* 373* 255*  --   More results in Results Review 164*  More results in Results Review 409*   EGFR 11.6   11.6 12.9 12.2  --   More results in Results Review 12.5  More results in Results Review 11.9   More results in Results Review = values in this interval not displayed.           Signed by: Verl Blalock, MD

## 2019-05-21 NOTE — Discharge Summary (Signed)
[facility]      Patient: [name]  Admission Date: 05/12/2019   DOB: 03-Jun-1955  Discharge Date: 05/21/2019    MRN: [id number]  Discharge Attending:Brysan Mcevoy D Fitzhugh Vizcarrondo     Referring Physician: No primary care provider on file.  PCP: No primary care provider on file.       DISCHARGE SUMMARY     Discharge Information   Admission Diagnosis:   AKI (acute kidney injury)    Discharge Diagnosis:   Active Hospital Problems    Diagnosis    Iron deficiency anemia secondary to inadequate dietary iron intake    Sideropenic dysphagia    AKI (acute kidney injury)        Admission Condition: Guarded   Discharge Condition: Stable   Consultants: GI, Nephrology   Functional Status: Ambulatory   Discharged to: SNF     Discharge Medications:     Medication List      START taking these medications    amLODIPine 5 MG tablet  Commonly known as: NORVASC  Take 1 tablet (5 mg total) by mouth every 12 (twelve) hours     insulin glargine 100 UNIT/ML injection  Commonly known as: LANTUS  Inject 15 Units into the skin nightly     pantoprazole 40 MG tablet  Commonly known as: PROTONIX  Take 1 tablet (40 mg total) by mouth 2 (two) times daily     potassium chloride 20 MEQ tablet  Commonly known as: KLOR-CON  Take 2 tablets (40 mEq total) by mouth 2 (two) times daily for 4 days        CONTINUE taking these medications    aspirin EC 81 MG EC tablet     atorvastatin 40 MG tablet  Commonly known as: LIPITOR     carvedilol 25 MG tablet  Commonly known as: COREG     cilostazol 100 MG tablet  Commonly known as: PLETAL     famotidine 40 MG tablet  Commonly known as: PEPCID     furosemide 40 MG tablet  Commonly known as: LASIX     gabapentin 600 MG tablet  Commonly known as: NEURONTIN     insulin aspart 100 UNIT/ML injection  Commonly known as: NovoLOG     oxyCODONE-acetaminophen 5-325 MG per tablet  Commonly known as: PERCOCET     sevelamer 800 MG tablet  Commonly known as: RENVELA        STOP taking these medications     amLODIPine-atorvastatin 10-10 MG per tablet  Commonly known as: CADUET           Where to Get Your Medications      These medications were sent to [facility], [address], [place] [address]    Phone: [phone]    potassium chloride 20 MEQ tablet     Information about where to get these medications is not yet available    Ask your nurse or doctor about these medications   amLODIPine 5 MG tablet   insulin glargine 100 UNIT/ML injection   pantoprazole 40 MG tablet             Hospital Course   Presentation History   Per Dr Ann Maki "[name] is a 64 y.o. female with history of hypertension, hyperlipidemia, insulin-dependent diabetes mellitus, COPD, end-stage renal disease on peritoneal dialysis who presents to the emergency department for evaluation of a 3-day history of intractable nausea,  vomiting and nonbloody diarrhea.  She states that she ate an ultimate omelette at [facility] on 05/09/19 and following that her symptoms started.  She complains of constant diffuse abdominal pain described as aching pain, reaching 10 out of 10 in severity which is exacerbated by movement.  She also reports a fever 102 last night along with chills, feeling dizzy and near syncope, generalized weakness. She reports no history of renal stones.  She does report having a UTI in the past.  She states that she had noted blood in one of her episodes of emesis.  She states that she was hospitalized 1 month ago at [facility] where she had an upper endoscopy but she cannot recall the results.  States that she was started on hemodialysis last May 2020 and after that she was transitioned to peritoneal dialysis at home.  She states that she intermittently makes urine and last week she had frequent urination.  She reports no headache, visual changes, syncope, loss of consciousness, hematochezia, melena, dysuria, hematuria, numbness/tingling, focal weakness.    In the ED, patient was hypertensive on presentation, afebrile, not tachycardic or hypoxic.  Labs significant for glucose of 551, creatinine 4.7.  She received 3 L normal saline bolus and vancomycin and Zosyn in the ED    See HPI for details.    Hospital Course (9 Days)   [name] a 64 y.o.femalewith history ofhypertension, hyperlipidemia, insulin-dependent diabetes mellitus, COPD, end-stage renal disease on peritoneal dialysiswho presents to the emergency department for evaluation of a 3-day history of intractable nausea,vomiting and nonbloody diarrhea, admitted for viral gastroenteritis and UTI.    #UGIB - Patient had 1 episode of 400 cc coffee ground emesis on 4/20 per RN  #Possible melena per patient report  -s/p EGD on 4/21: Grade D gastritis  -Continue Protonix 40 mg BID  -Ok to restart ASA and pletal per GI  -H/H stable   -GI recommendations appreciated. Outpatient follow-up for repeat EGD.    Chest pain  -EKG NSR, troponin x2 negative.  -D dimer elevated, CTA chest negative for PE.     #GNR Peritonitis, pansensitive   #Abdominal pain  #Nausea, vomiting, diarrhea - Resolved  #UTI  -Likelydue toviral gastroenteritis/UTI/Peritonitis  -Peritoneal fluid with 51,000 WBC's. Improved to 200s   -Blood cultures NGTD, lactic acid normal and lipase normal  -Urine culture: >100,000 CFU/ML of multiple bacterial morphotypes   -CT abd/pelvis wo contrastrevealsno evidence of bowel obstruction, bowel ischemia, perforation, diverticulitis, pancreatitis, nephrolithiasis and shows intraperitoneal fluid  -PRN antiemetics for nausea/vomiting  -Continue IP cefazolin with PD exchanges. Nephrology will be placing orders for this.   -ID recommendations appreciated    #Hypokalemia  #Hypomagnesemia  -Resolved     #ESRD on peritoneal dialysis  -Started on dialysis on 05/2018  -CT abd/pelvis shows moderate amount of ascites ? Dialysis fluid  -Under care everywhere, noted that Cr from 10/2018-11/2018 has been  3.8-5 range  -Continue PD with IP antibiotics  -Repeat cell count per nephrology   -Nephrology following    #Hypertension  -Continue home amlodipine, coreg    #Hyperglycemia  #Type 2 DM  -Continue home lantus, SSI, ac/hs accuchecks  -HbA1c 9.8%    #Hyperlipidemia   -Continue home statin    #Iron deficiency anemia  #Anemia of Chronic disease   -Completed IV Venofer 3/3  -S/p 1 unit pRBC transfusion     [place] instructions given     Return instructions given     No changes in clinical course over the  last 24 hours. Delay in [place] due to pt awaiting placement.     Procedures/Imaging:   CT Angiogram Chest    Result Date: 05/18/2019     No evidence of pulmonary embolism. Small bilateral pleural effusions. Large hiatal hernia. Trace perihepatic ascites. Other nonurgent findings as above. Lonia Skinner, MD  05/18/2019 9:58 AM           Best Practices   Was the patient admitted with either a CHF Exacerbation or Pneumonia? No     Progress Note/Physical Exam at Discharge     Subjective: Pt seen and examined at bedside.   Denies cp, sob.   Denies abdominal pain, nausea/vomiting.   Denies diarrhea/constipation. Last BM yesterday.   Ready to go to rehab.     Vitals:    05/21/19 0429 05/21/19 0446 05/21/19 0746 05/21/19 0956   BP: 170/84 157/82 160/84 160/84   Pulse: 63 63 60 60   Resp: 20  17    Temp: 98.8 F (37.1 C) 98.1 F (36.7 C) 98.4 F (36.9 C)    TempSrc: Oral Oral Oral    SpO2: 98% 98% 99%    Weight:       Height:           General: NAD, AAOx3  HEENT: perrla, eomi, sclera anicteric, OP: Clear, MMM  Neck: supple, FROM, no LAD  Cardiovascular: RRR, no m/r/g  Lungs: CTAB, no w/r/r  Abdomen: soft, +BS, NT/ND, no masses, no g/r  Extremities: no C/C/E  Skin: no rashes or lesions noted  Neuro: non focal, moves all extremities        Diagnostics     Labs/Studies Pending at Discharge: No    Last Labs   Recent Labs   Lab 05/21/19  0425 05/20/19  0500 05/17/19  0936   WBC 7.54 8.21 9.39   RBC 3.19* 3.35* 3.21*   Hgb 9.3* 9.8* 9.3*    Hematocrit 31.4* 32.5* 30.7*   MCV 98.4* 97.0* 95.6   Platelets 199 195 145       Recent Labs   Lab 05/21/19  0425 05/20/19  0323 05/18/19  0844 05/17/19  1337 05/17/19  0939 05/16/19  0354   Sodium 140   140 137 140  --  141 138   Potassium 4.5   4.6 4.1 4.2  --  3.0* 3.1*   Chloride 102   102 100 102  --  103 100   CO2 29   29 28  32*  --  28 28   BUN 28.0*   27.0* 25.0* 25.0*  --  24.0* 30.0*   Creatinine 4.6*   4.6* 4.2* 4.4*  --  4.3* 4.5*   Glucose 414*   415* 373* 255*  --  164* 409*   Calcium 7.9*   8.1* 8.1* 7.8*  --  7.4* 7.2*   Magnesium  --   --   --  1.8 1.7 1.4*       Microbiology Results     Procedure Component Value Units Date/Time    Blood Culture Aerobic/Anaerobic #1 [[id number]] Collected: 05/12/19 1855    Specimen: Arm from Blood, Venipuncture Updated: 05/18/19 0021    Narrative:      ORDER#: [id number]                                    ORDERED BY: Madalyn Rob  SOURCE: Blood, Venipuncture Arm  COLLECTED:  05/12/19 18:55  ANTIBIOTICS AT COLL.:                                RECEIVED :  05/12/19 21:53  Culture Blood Aerobic and Anaerobic        FINAL       05/18/19 00:21  05/18/19   No growth after 5 days of incubation.      Blood Culture Aerobic/Anaerobic #2 [[id number]] Collected: 05/12/19 1855    Specimen: Arm from Blood, Venipuncture Updated: 05/18/19 0021    Narrative:      ORDER#: [id number]                                    ORDERED BY: Dwyane Dee, VINOD  SOURCE: Blood, Venipuncture Arm                      COLLECTED:  05/12/19 18:55  ANTIBIOTICS AT COLL.:                                RECEIVED :  05/12/19 21:53  Culture Blood Aerobic and Anaerobic        FINAL       05/18/19 00:21  05/18/19   No growth after 5 days of incubation.      COVID-19 (SARS-COV-2) [employer] [facility]) [[id number]] Collected: 05/12/19 1611    Specimen: Nasopharyngeal Swab from Nasopharynx Updated: 05/12/19 1648     Purpose of COVID testing Screening     SARS-CoV-2 Specimen Source Nasopharyngeal     SARS CoV 2  Overall Result Negative     Comment: Test performed using the Abbott ID NOW EUA assay.  Please see Fact Sheets for patients and providers located at:  http://olson-hall.info/  This test is for the qualitative detection of SARS-CoV-2  (COVID19) nucleic acid. Viral nucleic acids may persist in vivo,  independent of viability. Detection of viral nucleic acid does  not imply the presence of infectious virus, or that virus  nucleic acid is the cause of clinical symptoms. Negative  results should be treated as presumptive and, if inconsistent  with clinical signs and symptoms or necessary for patient  management, should be tested with an alternative molecular  assay. Negative results do not preclude SARS-CoV-2 infection  and should not be used as the sole basis for patient  management decisions. Invalid results may be due to inhibiting  substances in the specimen and recollection should occur.         Narrative:      o Collect and clearly label specimen type:  o Upper respiratory specimen: One Nasopharyngeal Dry Swab NO  Transport Media.  o Hand deliver to laboratory ASAP  Indication for testing->Extended care facility admission to  semi private room    Culture + Gram Lenise Arena, Body Fluid [[id number]] Collected: 05/13/19 1447    Specimen: Body Fluid from Ascites Fluid Updated: 05/15/19 1818    Narrative:      [id number] called Micro Results of fluid. Results read back [id number], by [id number]  on 05/15/2019 at 18:17  ORDER#: [id number]                                    ORDERED BY: Evonnie Dawes  SOURCE: Ascites Fluid ascites                        COLLECTED:  05/13/19 14:47  ANTIBIOTICS AT COLL.:                                RECEIVED :  05/13/19 19:33  [id number]_ called Micro Results of fluid. Results read back [id number], by [id number] on 05/15/2019 at 18:17  Stain, Gram                                FINAL       05/13/19 21:48  05/13/19   Many WBCs             No organisms seen  Culture and Gram Stain, Aerobic, Body  FluidFINAL       05/15/19 18:18   +  05/15/19   Very light growth of Escherichia coli      _____________________________________________________________________________                                     E.coli       ANTIBIOTICS                     MIC  INTRP      _____________________________________________________________________________  Amoxicillin/CA                  8/4    S        Ampicillin                      >16    R        Ampicillin/sulbactam           16/8    I        Aztreonam                       <=2    S        Cefazolin                        2     S        Cefepime                        <=1    S        Cefoxitin                       <=4    S        Ceftazidime                     <=2    S        Ceftriaxone                     <=1    S        Cefuroxime                      <=4    S        Ciprofloxacin                 <=  0.25   S        Ertapenem                     <=0.25   S        Gentamicin                      <=2    S        Levofloxacin                   <=0.5   S        Meropenem                      <=0.5   S        Piperacillin/Tazobactam        <=2/4   S        Tetracycline                    >8     R        Trimethoprim/Sulfamethoxazole  >2/38   R        _____________________________________________________________________________            S=SUSCEPTIBLE     I=INTERMEDIATE     R=RESISTANT                            N/S=NON-SUSCEPTIBLE  _____________________________________________________________________________      Urine culture [[id number]] Collected: 05/12/19 1611    Specimen: Bladder Urine Updated: 05/14/19 0909    Narrative:      Replace urinary catheter prior to obtaining the urine culture  if it has been in place for greater than or equal to 14  days:->N/A No Foley  Indications for U/A Reflex to Micro - Reflex to  Culture:->Suprapubic Pain/Tenderness or Dysuria  ORDER#: [id number]                                    ORDERED BY: HAILE, LYDIA  SOURCE: Urine                                         COLLECTED:  05/12/19 16:11  ANTIBIOTICS AT COLL.:                                RECEIVED :  05/12/19 16:26  Culture Urine                              FINAL       05/14/19 09:09  05/14/19   >100,000 CFU/ML of multiple bacterial morphotypes present.             Possible contamination, appropriate recollection is             requested if clinically indicated.             Patient Instructions   Discharge Diet: renal diet   Discharge Activity: as tolerated     Follow Up Appointment:  Follow-up Information     Ronie Spies, MD Follow up.    Specialties: Gastroenterology, Internal Medicine  Contact information:  [address]  [place] [address]  [phone]                    Time spent examining patient, discussing with patient/family regarding hospital course, chart review, reconciling medications and discharge planning: 45 minutes.    Milton Ferguson    10:13 AM 05/21/2019

## 2019-05-21 NOTE — Progress Notes (Signed)
Treatment completed with clear yellow fluid noted in the drain bag. Pt denied any complaints when disconnected from the PD machine.     05/21/19 0830   Completing APD/CCPD (Taking the Patient OFF)    Effluent Appearance Clear;Yellow   Specimen Collected No   Last Fill Volume (mL) 2500 mL   Volume Collected in Drainage Bag (mL) 501 mL  (Total UF)   Peritoneal Dialysis Comments CCPD completed   Mid-Day Manual Exchange    Is there a mid-day exchange? No   Peritoneal Dialysis Catheter   No Placement Date or Time found.   Present on Admission?: Yes  Catheter Type: Baxter   Site Assessment Clean;Dry;Intact

## 2019-05-21 NOTE — Progress Notes (Signed)
Nutritional Support Services  Nutrition Follow-up    [name] 64 y.o. female   MRN: [id number]    Summary of Nutrition Recommendations:  1) Continue current diet as tolerated. Encourage po intake    2) Change ONS to 1 Glucerna po daily with lunch. Each Glucerna shake provides 220 kcal and 10 gm protein.    3) Daily weights    4) Monitor BG- adjust insulin regimen as needed for optimal BG control   POCT BG 179-282 x  past 24hr    -----------------------------------------------------------------------------------------------------------------    RN unavailable at time of assessment                                                       ASSESSMENT DATA     Subjective Nutrition: Pt seen sitting up in bed AAOx4. Brief interview, pt with only short responses to questions. Reports intake remains variable but overall improving. Awaiting breakfast tray to arrive. Pt reports not drinking Nepro, states she prefers strawberry flavored supplements- will add 1 strawberry Glucerna daily (380mg /~36mEq potassium per bottle). Denies N/V/D. Some abdominal pain but reports "only a little". No additional issues reported at this time    Learning Needs: No current education needs expressed- resources previously attached to AVS for renal and DM diet    Events of Current Admission:  64yo female admitted for abdominal pain, N/V/D, fever; Nephrology following for PD; ABX for UTI; d/c planning- awaiting placement    Medical Hx:  has a past medical history of Chronic obstructive pulmonary disease, Diabetes mellitus, Hyperlipidemia, Hypertension, and Sleep apnea (2018).       Orders Placed This Encounter   Procedures   . Diet renal Protein restriction: 80 GM Protein; Additional restrictions: CONSISTENT CARBOHYDRATE   . Glucerna Supplement Quantity: A. One; Flavor: Strawberry; Frequency: Daily with lunch     Intake:    4/28: awaiting breakfast at time of assessment  4/27: 25%, 75%, 50%; 0% Nepro x 2  4/26: 100% x 2, 25%; 75% Nepro x  1  4/25: 100% x 1, 0% Nepro; no other po intake recorded  4/24: 50% x 3; 0% Nepro  4/23: 75% x 1; 0% Nepro  4/22: 75%, 100%; 100% Nepro x 1    ANTHROPOMETRIC  Height: 157.5 cm (5' 2.01")  Weight: 121.3 kg (267 lb 6.7 oz)  Weight Change: -0.57  IBW/kg (Calculated) Female: 50.02 kg  Body mass index is 48.9 kg/m.       Weight Monitoring Weight Weight Method   05/12/2019 112.038 kg Stated   05/12/2019 112.038 kg Bed Scale   05/14/2019 120.1 kg Bed Scale   05/15/2019 117.9 kg Bed Scale   05/16/2019 120.3 kg Bed Scale   05/17/2019 120.249 kg Bed Scale   05/18/2019 122.2 kg Bed Scale   05/19/2019 122 kg Bed Scale   05/20/2019 121.3 kg Bed Scale     Weight History Summary: Fluctuating weights throughout admission- likely related to PD. Question accuracy of 4/19 weight as significantly lower than weights from 4/21 to 4/27- otherwise relatively stable.      Physical Assessment: 4/28  Head: orbital region: slightly dark circles, somewhat hollow look (mild fat loss)  Upper Body: no overt s/s subcutaneous muscle or fat loss  Lower Body: no s/s subcutaneous fat or muscle loss  Edema: no edema per flowsheet  Skin: intact per flowsheet   GI function: LBM 4/27    ESTIMATED NEEDS    Total Daily Energy Needs: 2016 to 2576 kcal  Method for Calculating Energy Needs: 18 kcal - 23 kcal per kg  at 112 kg (Actual body weight)  Rationale: BMI > 30, non-ICU, ESRD on PD       Total Daily Protein Needs: 69.8544 to 84.8232 g  Method for Calculating Protein Needs: 1.4 g - 1.7 g per kg at 49.896 kg (Ideal body weight)  Rationale: BMI > 30, non-ICU, ESRD on PD      Total Daily Fluid Needs: 1097.712 to 1247.4 ml  Method for Calculating Fluid Needs: 22 ml - 25 ml  per kg at 49.896 kg (Ideal body weight)  Rationale: BMI >30, ESRD on PD    Pertinent Medications: Norvasc, Lipitor, Coreg, Aranesp, Dianeal PD, Pepcid, Lantus, Humalog, Chronulac, Protonix, Klor-Con, Renvela    IVF:    . sodium chloride 100 mL/hr at 05/14/19 1828   . dianeal low calcium (2.5  mEq/L) 4.25% dextrose     . dianeal PD-2 (calcium 3.5 mEq/L) 2.5% dextrose     . lactated ringers Stopped (05/15/19 1409)       Pertinent labs: BUN 28, Cr 4.6, Gluc 414, GFR 11.6                                                      NUTRITION DIAGNOSIS     Inadequate Protein-energy intake related to decreased appetite, N/V, abdominal pain as evidenced by poor po intake x 3 days pta, current po intake 25-100% throughout admission (active, updated)                Altered Nutrition related lab values related to ESRD on PD, DM as evidenced by BUN 28, Cr 4.6, Gluc 414, GFR 11.6 (active, updated)                                                           INTERVENTION     Nutrition recommendation - Please refer to top of note                                                       MONITORING/EVALUATION     Goals:     1) Pt will consume > 75% of meals and supplements by RD next assessment (active)       Nutrition Risk Level: Moderate (will follow up at least 1 time per week and PRN)       Edsel Petrin MS, RDN, [place] Dietitian  Spectralink-[phone]

## 2019-05-21 NOTE — Plan of Care (Signed)
Patient AO X 4, VSS no complain of pain or discomfort. On RA, no SOB or respiratory distress noted. Denies chest pain, no fever, nausea or vomiting during this shift. Tele monitoring in place, noted with NSR on the monitor. Acucheck done, insulin administered as ordered.   Medicated as ordered.  Fall bundle in place, encouraged patient to call for assistance as needed.   Purposeful rounding done.    Problem: Safety  Goal: Patient will be free from injury during hospitalization  Flowsheets (Taken 05/21/2019 0457)  Patient will be free from injury during hospitalization:   Assess patient's risk for falls and implement fall prevention plan of care per policy   Provide and maintain safe environment   Use appropriate transfer methods   Ensure appropriate safety devices are available at the bedside   Include patient/ family/ care giver in decisions related to safety   Hourly rounding     Problem: Renal Instability  Goal: Fluid and electrolyte balance are achieved/maintained  Flowsheets (Taken 05/21/2019 0455)  Fluid and electrolyte balance are achieved/maintained:   Monitor intake and output every shift   Monitor/assess lab values and report abnormal values   Provide adequate hydration   Assess for confusion/personality changes   Assess and reassess fluid and electrolyte status   Monitor daily weight  Goal: Nutritional intake is adequate  Flowsheets (Taken 05/21/2019 0456)  Nutritional intake is adequate:   Monitor daily weights   Allow adequate time for meals   Encourage/perform oral hygiene as appropriate   Include patient/patient care companion in decisions related to nutrition   Consult/collaborate with Clinical Nutritionist     Problem: Altered GI Function  Goal: Nutritional intake is adequate  Flowsheets (Taken 05/21/2019 0456)  Nutritional intake is adequate:   Monitor daily weights   Allow adequate time for meals   Encourage/perform oral hygiene as appropriate   Include patient/patient care companion in decisions  related to nutrition   Consult/collaborate with Clinical Nutritionist

## 2019-05-21 NOTE — Plan of Care (Signed)
Problem: Safety  Goal: Patient will be free from injury during hospitalization  Outcome: Progressing  Flowsheets (Taken 05/21/2019 1404)  Patient will be free from injury during hospitalization:   Assess patient's risk for falls and implement fall prevention plan of care per policy   Provide and maintain safe environment   Use appropriate transfer methods     Problem: Compromised Tissue integrity  Goal: Damaged tissue is healing and protected  Outcome: Progressing  Flowsheets (Taken 05/21/2019 1404)  Damaged tissue is healing and protected:   Monitor/assess Braden scale every shift   Increase activity as tolerated/progressive mobility   Relieve pressure to bony prominences for patients at moderate and high risk   Keep intact skin clean and dry  Goal: Nutritional status is improving  Outcome: Progressing  Flowsheets (Taken 05/21/2019 1404)  Nutritional status is improving:   Allow adequate time for meals   Encourage patient to take dietary supplement(s) as ordered     Problem: Renal Instability  Goal: Fluid and electrolyte balance are achieved/maintained  Outcome: Progressing  Flowsheets (Taken 05/21/2019 1404)  Fluid and electrolyte balance are achieved/maintained:   Monitor/assess lab values and report abnormal values   Assess for confusion/personality changes   Assess and reassess fluid and electrolyte status  Goal: Nutritional intake is adequate  Outcome: Progressing  Flowsheets (Taken 05/21/2019 1404)  Nutritional intake is adequate: Allow adequate time for meals     Problem: Altered GI Function  Goal: Nutritional intake is adequate  Outcome: Progressing  Flowsheets (Taken 05/21/2019 1404)  Nutritional intake is adequate: Allow adequate time for meals

## 2019-05-21 NOTE — Progress Notes (Signed)
Pt refused her lactulose,despite educated on the importance of taking the medication.

## 2019-05-22 ENCOUNTER — Ambulatory Visit (INDEPENDENT_AMBULATORY_CARE_PROVIDER_SITE_OTHER): Payer: Medicaid Managed Care Other | Admitting: Nurse Practitioner

## 2019-05-22 LAB — COMPREHENSIVE METABOLIC PANEL
ALT: <6 U/L (ref 0–55)
AST (SGOT): 10 U/L (ref 5–34)
Albumin/Globulin Ratio: 0.5 — ABNORMAL LOW (ref 0.9–2.2)
Albumin: 1.7 g/dL — ABNORMAL LOW (ref 3.5–5.0)
Alkaline Phosphatase: 99 U/L (ref 37–106)
Anion Gap: 9 (ref 5.0–15.0)
BUN: 25 mg/dL — ABNORMAL HIGH (ref 7.0–19.0)
Bilirubin, Total: 0.2 mg/dL (ref 0.2–1.2)
CO2: 27 mEq/L (ref 22–29)
Calcium: 7.8 mg/dL — ABNORMAL LOW (ref 8.5–10.5)
Chloride: 103 mEq/L (ref 100–111)
Creatinine: 4.6 mg/dL — ABNORMAL HIGH (ref 0.6–1.0)
Globulin: 3.2 g/dL (ref 2.0–3.6)
Glucose: 342 mg/dL — ABNORMAL HIGH (ref 70–100)
Potassium: 4.4 mEq/L (ref 3.5–5.1)
Protein, Total: 4.9 g/dL — ABNORMAL LOW (ref 6.0–8.3)
Sodium: 139 mEq/L (ref 136–145)

## 2019-05-22 LAB — HEMOLYSIS INDEX: Hemolysis Index: 2 (ref 0–18)

## 2019-05-22 LAB — CBC
Absolute NRBC: 0.02 10*3/uL — ABNORMAL HIGH (ref 0.00–0.00)
Hematocrit: 33.1 % — ABNORMAL LOW (ref 34.7–43.7)
Hgb: 9.9 g/dL — ABNORMAL LOW (ref 11.4–14.8)
MCH: 29.3 pg (ref 25.1–33.5)
MCHC: 29.9 g/dL — ABNORMAL LOW (ref 31.5–35.8)
MCV: 97.9 fL — ABNORMAL HIGH (ref 78.0–96.0)
MPV: 11.9 fL (ref 8.9–12.5)
Nucleated RBC: 0.3 /100 WBC — ABNORMAL HIGH (ref 0.0–0.0)
Platelets: 225 10*3/uL (ref 142–346)
RBC: 3.38 10*6/uL — ABNORMAL LOW (ref 3.90–5.10)
RDW: 13 % (ref 11–15)
WBC: 7.78 10*3/uL (ref 3.10–9.50)

## 2019-05-22 LAB — GLUCOSE WHOLE BLOOD - POCT
Whole Blood Glucose POCT: 280 mg/dL — ABNORMAL HIGH (ref 70–100)
Whole Blood Glucose POCT: 132 mg/dL — ABNORMAL HIGH (ref 70–100)
Whole Blood Glucose POCT: 158 mg/dL — ABNORMAL HIGH (ref 70–100)
Whole Blood Glucose POCT: 320 mg/dL — ABNORMAL HIGH (ref 70–100)

## 2019-05-22 LAB — GFR: EGFR: 11.6

## 2019-05-22 NOTE — Nursing Progress Note (Signed)
Medicine Nursing Note    Patient Name: [name]  Attending Physician: Ardeth Sportsman, MD    Assessment     Shift event:    Neuro: AOx4, calm and cooperative    Tele/CV/CP 24hr  Tele, VSS, RA, Afebrile   Foley/GU Pt is anuric, has peritoneal dialysis cath   BMs/GI Last BM on 4/27 , on cho and renal 80g restricton   Skin: Intact, warm and dry   Isolation: NA   Pain: Denied pain/discomfort   Mews: 1   Central Line: NA   Safety:  Placed ____high___ fall risk. Safety maintained, purposeful rounding performed, Call bell within reach.    Interpreter: Pt speaks English and denies interpreter needs.   Comments:  Fall bundle is in place. Pt complained of pain from her peritoneal site, had dialysis nurse look at it. Pt wants to change to hemodialysis and will ask MD in the morning about it. Blood sugar was stable, no need for coverage except for her lantus. Vitals have been stable, no SOB or dizziness. Has been NSR. CHG bath was done. Will continue to monitor her.

## 2019-05-22 NOTE — OT Progress Note (Signed)
[facility]   Occupational Therapy Attempt Note    Patient:  [name] MRN#:  [id number]  Unit:  [place] Room/Bed:  A2315/A2315.01      Occupational Therapy treatment attempted on 05/22/2019 at 3:23 PM unable to complete secondary to patient eating. Therapist will reattempt today.      [address], OTR/L, CNT, [place], [place] [address]  3:23 PM 05/22/2019

## 2019-05-22 NOTE — Discharge Summary (Signed)
[facility]      Patient: [name]  Admission Date: 05/12/2019   DOB: [birth year]  Discharge Date: 05/22/2019    MRN: [id number]  Discharge Attending:Breven Guidroz D [place]     Referring Physician: No primary care provider on file.  PCP: No primary care provider on file.       DISCHARGE SUMMARY     Discharge Information   Admission Diagnosis:   AKI (acute kidney injury)    Discharge Diagnosis:   Active Hospital Problems    Diagnosis    Iron deficiency anemia secondary to inadequate dietary iron intake    Sideropenic dysphagia    AKI (acute kidney injury)        Admission Condition: Guarded   Discharge Condition: Stable   Consultants: GI, Nephrology   Functional Status: Ambulatory   Discharged to: SNF     Discharge Medications:     Medication List      START taking these medications    amLODIPine 5 MG tablet  Commonly known as: NORVASC  Take 1 tablet (5 mg total) by mouth every 12 (twelve) hours     insulin glargine 100 UNIT/ML injection  Commonly known as: LANTUS  Inject 15 Units into the skin nightly     pantoprazole 40 MG tablet  Commonly known as: PROTONIX  Take 1 tablet (40 mg total) by mouth 2 (two) times daily        CONTINUE taking these medications    aspirin EC 81 MG EC tablet     atorvastatin 40 MG tablet  Commonly known as: LIPITOR     carvedilol 25 MG tablet  Commonly known as: COREG     cilostazol 100 MG tablet  Commonly known as: PLETAL     famotidine 40 MG tablet  Commonly known as: PEPCID     furosemide 40 MG tablet  Commonly known as: LASIX     gabapentin 600 MG tablet  Commonly known as: NEURONTIN     insulin aspart 100 UNIT/ML injection  Commonly known as: NovoLOG     oxyCODONE-acetaminophen 5-325 MG per tablet  Commonly known as: PERCOCET     sevelamer 800 MG tablet  Commonly known as: RENVELA        STOP taking these medications    amLODIPine-atorvastatin 10-10 MG per tablet  Commonly known as: CADUET           Where to Get Your Medications      Information about where to get these  medications is not yet available    Ask your nurse or doctor about these medications   amLODIPine 5 MG tablet   insulin glargine 100 UNIT/ML injection   pantoprazole 40 MG tablet             Hospital Course   Presentation History   Per Dr Ann Maki "[name] is a 64 y.o. female with history of hypertension, hyperlipidemia, insulin-dependent diabetes mellitus, COPD, end-stage renal disease on peritoneal dialysis who presents to the emergency department for evaluation of a 3-day history of intractable nausea, vomiting and nonbloody diarrhea.  She states that she ate an ultimate omelette at [facility] on 05/09/19 and following that her symptoms started.  She complains of constant diffuse abdominal pain described as aching pain, reaching 10 out of 10 in severity which is exacerbated by movement.  She also reports a fever 102 last night along with chills, feeling dizzy and near syncope, generalized weakness. She reports no history of renal stones.  She  does report having a UTI in the past.  She states that she had noted blood in one of her episodes of emesis.  She states that she was hospitalized 1 month ago at [facility] where she had an upper endoscopy but she cannot recall the results.  States that she was started on hemodialysis last May 2020 and after that she was transitioned to peritoneal dialysis at home.  She states that she intermittently makes urine and last week she had frequent urination.  She reports no headache, visual changes, syncope, loss of consciousness, hematochezia, melena, dysuria, hematuria, numbness/tingling, focal weakness.   In the ED, patient was hypertensive on presentation, afebrile, not tachycardic or hypoxic.  Labs significant for glucose of 551, creatinine 4.7.  She received 3 L normal saline bolus and vancomycin and Zosyn in the ED    See HPI for details.    Hospital Course (10 Days)   Diane Loh McKethanis a 64 y.o.femalewith history ofhypertension, hyperlipidemia,  insulin-dependent diabetes mellitus, COPD, end-stage renal disease on peritoneal dialysiswho presents to the emergency department for evaluation of a 3-day history of intractable nausea,vomiting and nonbloody diarrhea, admitted for viral gastroenteritis and UTI.    #UGIB - Patient had 1 episode of 400 cc coffee ground emesis on 4/20 per RN  #Possible melena per patient report  -s/p EGD on 4/21: Grade D gastritis  -Continue Protonix 40 mg BID  -Ok to restart ASA and pletal per GI  -H/H stable   -GI recommendations appreciated. Outpatient follow-up for repeat EGD.    Chest pain  -EKG NSR, troponin x2 negative.  -D dimer elevated, CTA chest negative for PE.     #GNR Peritonitis, pansensitive   #Abdominal pain  #Nausea, vomiting, diarrhea - Resolved  #UTI  -Likelydue toviral gastroenteritis/UTI/Peritonitis  -Peritoneal fluid with 51,000 WBC's. Improved to 200s   -Blood cultures NGTD, lactic acid normal and lipase normal  -Urine culture: >100,000 CFU/ML of multiple bacterial morphotypes   -CT abd/pelvis wo contrastrevealsno evidence of bowel obstruction, bowel ischemia, perforation, diverticulitis, pancreatitis, nephrolithiasis and shows intraperitoneal fluid  -PRN antiemetics for nausea/vomiting  -Continue IP cefazolin with PD exchanges. Nephrology will be placing orders for this.   -ID recommendations appreciated    #Hypokalemia  #Hypomagnesemia  -Resolved     #ESRD on peritoneal dialysis  -Started on dialysis on 05/2018  -CT abd/pelvis shows moderate amount of ascites ? Dialysis fluid  -Under care everywhere, noted that Cr from 10/2018-11/2018 has been 3.8-5 range  -Continue PD with IP antibiotics  -Repeat cell count per nephrology   -Nephrology following    #Hypertension  -Continue home amlodipine, coreg    #Hyperglycemia  #Type 2 DM  -Continue home lantus, SSI, ac/hs accuchecks  -HbA1c 9.8%    #Hyperlipidemia   -Continue home statin    #Iron deficiency anemia  #Anemia of Chronic disease   -Completed  IV Venofer 3/3  -S/p 1 unit pRBC transfusion     [place] instructions given     Return instructions given     No changes in clinical course over the last 24 hours. Delay in [place] due to pt awaiting placement.     Procedures/Imaging:   CT Angiogram Chest    Result Date: 05/18/2019     No evidence of pulmonary embolism. Small bilateral pleural effusions. Large hiatal hernia. Trace perihepatic ascites. Other nonurgent findings as above. Lonia Skinner, MD  05/18/2019 9:58 AM           Best Practices   Was the patient  admitted with either a CHF Exacerbation or Pneumonia? No     Progress Note/Physical Exam at Discharge     Subjective: Pt seen and examined at bedside.   Pt sleeping on arrival. Denies any complaints.   Denies cp, sob.   Ready to go to rehab.     Vitals:    05/21/19 1939 05/21/19 2332 05/22/19 0436 05/22/19 0731   BP: 138/70 160/74 167/74 165/82   Pulse: 65 64 67 64   Resp: 17 16 16 17    Temp: 97.5 F (36.4 C) 98.2 F (36.8 C) 98.2 F (36.8 C) 98.2 F (36.8 C)   TempSrc: Oral Oral Oral Oral   SpO2: 96% 97% 97% 98%   Weight:   121 kg (266 lb 12.1 oz)    Height:           General: NAD, AAOx3  HEENT: perrla, eomi, sclera anicteric, OP: Clear, MMM  Neck: supple, FROM, no LAD  Cardiovascular: RRR, no m/r/g  Lungs: CTAB, no w/r/r  Abdomen: soft, +BS, NT/ND, no masses, no g/r  Extremities: no C/C/E  Skin: no rashes or lesions noted  Neuro: non focal, moves all extremities        Diagnostics     Labs/Studies Pending at Discharge: No    Last Labs   Recent Labs   Lab 05/22/19  0440 05/21/19  0425 05/20/19  0500   WBC 7.78 7.54 8.21   RBC 3.38* 3.19* 3.35*   Hgb 9.9* 9.3* 9.8*   Hematocrit 33.1* 31.4* 32.5*   MCV 97.9* 98.4* 97.0*   Platelets 225 199 195       Recent Labs   Lab 05/22/19  0440 05/21/19  0425 05/20/19  0323 05/18/19  0844 05/17/19  1337 05/17/19  0939 05/16/19  0354 05/16/19  0354   Sodium 139 140   140 137 140  --  141  More results in Results Review 138   Potassium 4.4 4.5   4.6 4.1 4.2  --  3.0*  More results  in Results Review 3.1*   Chloride 103 102   102 100 102  --  103  More results in Results Review 100   CO2 27 29   29 28  32*  --  28  More results in Results Review 28   BUN 25.0* 28.0*   27.0* 25.0* 25.0*  --  24.0*  More results in Results Review 30.0*   Creatinine 4.6* 4.6*   4.6* 4.2* 4.4*  --  4.3*  More results in Results Review 4.5*   Glucose 342* 414*   415* 373* 255*  --  164*  More results in Results Review 409*   Calcium 7.8* 7.9*   8.1* 8.1* 7.8*  --  7.4*  More results in Results Review 7.2*   Magnesium  --   --   --   --  1.8 1.7  --  1.4*   More results in Results Review = values in this interval not displayed.       Microbiology Results     Procedure Component Value Units Date/Time    Blood Culture Aerobic/Anaerobic #1 [[id number]] Collected: 05/12/19 1855    Specimen: Arm from Blood, Venipuncture Updated: 05/18/19 0021    Narrative:      ORDER#: [id number]                                    ORDERED BY:  KUMAR, VINOD  SOURCE: Blood, Venipuncture Arm                      COLLECTED:  05/12/19 18:55  ANTIBIOTICS AT COLL.:                                RECEIVED :  05/12/19 21:53  Culture Blood Aerobic and Anaerobic        FINAL       05/18/19 00:21  05/18/19   No growth after 5 days of incubation.      Blood Culture Aerobic/Anaerobic #2 [[id number]] Collected: 05/12/19 1855    Specimen: Arm from Blood, Venipuncture Updated: 05/18/19 0021    Narrative:      ORDER#: [id number]                                    ORDERED BY: Dwyane Dee, VINOD  SOURCE: Blood, Venipuncture Arm                      COLLECTED:  05/12/19 18:55  ANTIBIOTICS AT COLL.:                                RECEIVED :  05/12/19 21:53  Culture Blood Aerobic and Anaerobic        FINAL       05/18/19 00:21  05/18/19   No growth after 5 days of incubation.      COVID-19 (SARS-COV-2) [employer] [facility]) [[id number]] Collected: 05/12/19 1611    Specimen: Nasopharyngeal Swab from Nasopharynx Updated: 05/12/19 1648     Purpose of COVID testing Screening      SARS-CoV-2 Specimen Source Nasopharyngeal     SARS CoV 2 Overall Result Negative     Comment: Test performed using the Abbott ID NOW EUA assay.  Please see Fact Sheets for patients and providers located at:  http://olson-hall.info/  This test is for the qualitative detection of SARS-CoV-2  (COVID19) nucleic acid. Viral nucleic acids may persist in vivo,  independent of viability. Detection of viral nucleic acid does  not imply the presence of infectious virus, or that virus  nucleic acid is the cause of clinical symptoms. Negative  results should be treated as presumptive and, if inconsistent  with clinical signs and symptoms or necessary for patient  management, should be tested with an alternative molecular  assay. Negative results do not preclude SARS-CoV-2 infection  and should not be used as the sole basis for patient  management decisions. Invalid results may be due to inhibiting  substances in the specimen and recollection should occur.         Narrative:      o Collect and clearly label specimen type:  o Upper respiratory specimen: One Nasopharyngeal Dry Swab NO  Transport Media.  o Hand deliver to laboratory ASAP  Indication for testing->Extended care facility admission to  semi private room    Culture + Gram Lenise Arena, Body Fluid [[id number]] Collected: 05/13/19 1447    Specimen: Body Fluid from Ascites Fluid Updated: 05/15/19 1818    Narrative:      [id number] called Micro Results of fluid. Results read back [id number], by [id number]  on 05/15/2019 at 18:17  ORDER#: [id number]  ORDERED BY: RANA, IRMINDRA  SOURCE: Ascites Fluid ascites                        COLLECTED:  05/13/19 14:47  ANTIBIOTICS AT COLL.:                                RECEIVED :  05/13/19 19:33  [id number]_ called Micro Results of fluid. Results read back [id number], by [id number] on 05/15/2019 at 18:17  Stain, Gram                                FINAL       05/13/19 21:48  05/13/19   Many WBCs              No organisms seen  Culture and Gram Stain, Aerobic, Body FluidFINAL       05/15/19 18:18   +  05/15/19   Very light growth of Escherichia coli      _____________________________________________________________________________                                     E.coli       ANTIBIOTICS                     MIC  INTRP      _____________________________________________________________________________  Amoxicillin/CA                  8/4    S        Ampicillin                      >16    R        Ampicillin/sulbactam           16/8    I        Aztreonam                       <=2    S        Cefazolin                        2     S        Cefepime                        <=1    S        Cefoxitin                       <=4    S        Ceftazidime                     <=2    S        Ceftriaxone                     <=1    S        Cefuroxime                      <=4    S        Ciprofloxacin                 <=  0.25   S        Ertapenem                     <=0.25   S        Gentamicin                      <=2    S        Levofloxacin                   <=0.5   S        Meropenem                      <=0.5   S        Piperacillin/Tazobactam        <=2/4   S        Tetracycline                    >8     R        Trimethoprim/Sulfamethoxazole  >2/38   R        _____________________________________________________________________________            S=SUSCEPTIBLE     I=INTERMEDIATE     R=RESISTANT                            N/S=NON-SUSCEPTIBLE  _____________________________________________________________________________      Urine culture [[id number]] Collected: 05/12/19 1611    Specimen: Bladder Urine Updated: 05/14/19 0909    Narrative:      Replace urinary catheter prior to obtaining the urine culture  if it has been in place for greater than or equal to 14  days:->N/A No Foley  Indications for U/A Reflex to Micro - Reflex to  Culture:->Suprapubic Pain/Tenderness or Dysuria  ORDER#: [id number]                                     ORDERED BY: HAILE, LYDIA  SOURCE: Urine                                        COLLECTED:  05/12/19 16:11  ANTIBIOTICS AT COLL.:                                RECEIVED :  05/12/19 16:26  Culture Urine                              FINAL       05/14/19 09:09  05/14/19   >100,000 CFU/ML of multiple bacterial morphotypes present.             Possible contamination, appropriate recollection is             requested if clinically indicated.             Patient Instructions   Discharge Diet: renal diet   Discharge Activity: as tolerated     Follow Up Appointment:  Follow-up Information     Ronie Spies, MD Follow up.    Specialties: Gastroenterology, Internal Medicine  Contact information:  [address]  [place] [address]  [phone]                    Time spent examining patient, discussing with patient/family regarding hospital course, chart review, reconciling medications and discharge planning: 45 minutes.    Keitha Butte Tanyiah Laurich    11:18 AM 05/22/2019

## 2019-05-22 NOTE — Progress Notes (Signed)
2:30 pm    Spoke with [facility] and [facility]. They do not offer PD. Spoke with [facility] and they do no take MD Medicaid.            3:10 pm     CM spoke with pt. According to pt, she got [place] [employer] card in the mail 2 days ago. She would like to got to [facility] but before that she wants to switch to HD. MD notified.                  Dede Query, RN MSN       Clinical Case Manager       [facility]        [address], [place], [place] [address]      [email or web address]      [phone]

## 2019-05-22 NOTE — PT Progress Note (Signed)
Physical Therapy Note    [facility]  Physical Therapy Attempt Note    Patient:  [name] MRN#:  [id number]  Unit:  [place] Room/Bed:  A2315/A2315.01      Physical Therapy treatment attempted on 05/22/2019 at 10:41 AM unable to complete secondary to patient declining 2/2 to not feeling well "not right now, maybe later." Patient education per importance of physical therapy in order to progress toward prior level of function with patient continuing to decline. Will follow up at a later time as schedule allows and when patient is appropriate.      RN aware.      Adah Salvage, [place] Lic# [id number]    Physical Medicine and [facility]  [phone]    05/22/2019  10:42 AM

## 2019-05-22 NOTE — Plan of Care (Signed)
Problem: Renal Instability  Goal: Fluid and electrolyte balance are achieved/maintained  Flowsheets (Taken 05/22/2019 0853)  Fluid and electrolyte balance are achieved/maintained:   Monitor daily weight   Monitor/assess lab values and report abnormal values   Assess and reassess fluid and electrolyte status  Goal: Nutritional intake is adequate  Flowsheets (Taken 05/22/2019 0853)  Nutritional intake is adequate:   Monitor daily weights   Consult/collaborate with Clinical Nutritionist   Allow adequate time for meals     Problem: Patient Receiving Advanced Renal Therapies  Goal: Therapy access site remains intact  Flowsheets (Taken 05/22/2019 0853)  Therapy access site remains intact:   Assess therapy access site   Change therapy access site dressing as needed

## 2019-05-22 NOTE — Plan of Care (Signed)
Problem: Moderate/High Fall Risk Score >5  Goal: Patient will remain free of falls  Outcome: Progressing  Flowsheets  Taken 05/16/2019 1905 by Gaynelle Cage, RN  Moderate Risk (6-13):   MOD-Consider activation of bed alarm if appropriate   MOD-Perform dangle, stand, walk (DSW) when getting patient up   MOD-Use gait belt when appropriate  Taken 05/14/2019 0831 by Julien Girt, RN  VH High Risk (Greater than 13):   Keep door open for better visibility   Use assistive devices   Request PT/OT therapy consult order from physician for patients with gait/mobility impairment   Use of floor mat   Use of roll guard     Problem: Safety  Goal: Patient will be free from injury during hospitalization  Outcome: Progressing  Flowsheets (Taken 05/21/2019 1404 by Orbie Hurst, RN)  Patient will be free from injury during hospitalization:   Assess patient's risk for falls and implement fall prevention plan of care per policy   Provide and maintain safe environment   Use appropriate transfer methods  Goal: Patient will be free from infection during hospitalization  Outcome: Progressing  Flowsheets (Taken 05/17/2019 1837 by Kearney Hard, RN)  Free from Infection during hospitalization:   Assess and monitor for signs and symptoms of infection   Monitor all insertion sites (i.e. indwelling lines, tubes, urinary catheters, and drains)   Monitor lab/diagnostic results   Encourage patient and family to use good hand hygiene technique     Problem: Pain  Goal: Pain at adequate level as identified by patient  Outcome: Progressing  Flowsheets (Taken 05/17/2019 1837 by Kearney Hard, RN)  Pain at adequate level as identified by patient:   Identify patient comfort function goal   Assess for risk of opioid induced respiratory depression, including snoring/sleep apnea. Alert healthcare team of risk factors identified.   Assess pain on admission, during daily assessment and/or before any "as needed" intervention(s)   Reassess pain within  30-60 minutes of any procedure/intervention, per Pain Assessment, Intervention, Reassessment (AIR) Cycle     Problem: Compromised Tissue integrity  Goal: Damaged tissue is healing and protected  Outcome: Progressing  Flowsheets (Taken 05/21/2019 1404 by Orbie Hurst, RN)  Damaged tissue is healing and protected:   Monitor/assess Braden scale every shift   Increase activity as tolerated/progressive mobility   Relieve pressure to bony prominences for patients at moderate and high risk    Problem: Renal Instability  Goal: Fluid and electrolyte balance are achieved/maintained  Outcome: Progressing  Flowsheets (Taken 05/21/2019 1404 by Orbie Hurst, RN)  Fluid and electrolyte balance are achieved/maintained:   Monitor/assess lab values and report abnormal values   Assess for confusion/personality changes   Assess and reassess fluid and electrolyte status  Goal: Nutritional intake is adequate  Outcome: Progressing  Flowsheets (Taken 05/21/2019 1404 by Orbie Hurst, RN)  Nutritional intake is adequate: Allow adequate time for meals  Goal: Perineal skin integrity is maintained or improved  Outcome: Progressing  Flowsheets (Taken 05/15/2019 2050 by Jory Sims, RN)  Perineal skin integrity is maintained or improved: Keep intact skin clean and dry  Goal: Free from infection  Outcome: Progressing  Flowsheets (Taken 05/20/2019 0829 by Lendell Caprice, RN)  Free from infection: Monitor/assess for signs and symptoms of infection     Problem: Patient Receiving Advanced Renal Therapies  Goal: Therapy access site remains intact  Outcome: Progressing  Flowsheets (Taken 05/20/2019 0829 by Lendell Caprice, RN)  Therapy access site remains intact: Assess therapy access site  Problem: Altered GI Function  Goal: Nutritional intake is adequate  Outcome: Progressing  Flowsheets (Taken 05/21/2019 1404 by Orbie Hurst, RN)  Nutritional intake is adequate: Allow adequate time for meals  Goal: Fluid and electrolyte balance are  achieved/maintained  Outcome: Progressing  Flowsheets (Taken 05/17/2019 1837 by Kearney Hard, RN)  Fluid and electrolyte balance are achieved/maintained:   Monitor intake and output every shift   Monitor/assess lab values and report abnormal values   Provide adequate hydration   Assess for confusion/personality changes   Monitor daily weight   Assess and reassess fluid and electrolyte status  Goal: Elimination patterns are normal or improving  Outcome: Progressing  Flowsheets (Taken 05/20/2019 1749 by Kearney Hard, RN)  Elimination patterns are normal or improving:   Report abnormal assessment to physician   Anticipate/assist with toileting needs   Assess for normal bowel sounds   Monitor for abdominal distension  Goal: Mobility/Activity is maintained at optimal level for patient  Outcome: Progressing  Flowsheets (Taken 05/19/2019 1909 by Kearney Hard, RN)  Mobility/activity is maintained at optimal level for patient:   Increase mobility as tolerated/progressive mobility   Encourage independent activity per ability   Perform active/passive ROM   Maintain proper body alignment   Plan activities to conserve energy, plan rest periods   Reposition patient every 2 hours and as needed unless able to reposition self  Goal: No bleeding  Outcome: Progressing  Flowsheets (Taken 05/15/2019 2050 by Jory Sims, RN)  No bleeding: Monitor and assess vitals and hemodynamic parameters     Problem: Compromised Hemodynamic Status  Goal: Vital signs and fluid balance maintained/improved  Outcome: Progressing  Flowsheets (Taken 05/22/2019 0315)  Vital signs and fluid balance are maintained/improved: Monitor/assess vitals and hemodynamic parameters with position changes     Problem: Inadequate Gas Exchange  Goal: Adequate oxygenation and improved ventilation  Outcome: Progressing  Flowsheets (Taken 05/22/2019 0315)  Adequate oxygenation and improved ventilation:   Assess lung sounds   Position for maximum ventilatory efficiency  Goal:  Patent Airway maintained  Outcome: Progressing  Flowsheets (Taken 05/22/2019 0315)  Patent airway maintained:   Provide adequate fluid intake to liquefy secretions   Position patient for maximum ventilatory efficiency     Problem: Inadequate Airway Clearance  Goal: Normal respiratory rate/effort achieved/maintained  Outcome: Progressing  Flowsheets (Taken 05/22/2019 0315)  Normal respiratory rate/effort achieved/maintained: Plan activities to conserve energy: plan rest periods    Problem: Impaired Mobility  Goal: Mobility/Activity is maintained at optimal level for patient  Outcome: Progressing  Flowsheets (Taken 05/19/2019 1909 by Kearney Hard, RN)  Mobility/activity is maintained at optimal level for patient:   Increase mobility as tolerated/progressive mobility   Encourage independent activity per ability   Perform active/passive ROM   Maintain proper body alignment   Plan activities to conserve energy, plan rest periods   Reposition patient every 2 hours and as needed unless able to reposition self

## 2019-05-22 NOTE — Progress Notes (Signed)
CCPD started @1715H ; No flow issue encountered; Initial drain  1780cc- clear effluent; no discomfort as claimed. PD cath dressing intact. Report given to Orbie Hurst RN.     05/22/19 1720   Initiating APD (Putting the Patient ON)    $APD Start Time 1715   Total Treatment Time Ordered (hours) 10 hours   Number of Cycles/Exchanges Ordered 5   Total Fill Volume (mL) 12500 mL   Fluid Orders for APD/CCPD   Cycler Bag #1 4.25%, Low Calcium in 5000 mL   Cycler Bag #2 2.5 %, Low Calcium in 5000 mL   Cycler Bag #3 1.5%, Low Calcium in 5000 mL   Last Fill Dianeal Solution Dextrose 1.5%   Last Fill Volume -if intermittent (mL) 2500 mL   Mid-Day Manual Exchange    Is there a mid-day exchange? No   Peritoneal Dialysis Catheter   No Placement Date or Time found.   Present on Admission?: Yes  Catheter Type: Baxter   Site Assessment Clean;Dry;Intact   Status Accessed   Dressing Status Clean;Dry;Intact   Dressing Occlusive

## 2019-05-22 NOTE — Plan of Care (Signed)
Pt alert, oriented able to make needs known. Medication administered as ordered. Pt refused to take lactulose verbalized she has a BM today. She was out of bed sitting on the chair. Continue monitor BS. She was seen by MD on round. Pt assisted with care. Safety bundle in place, call light within reach. Nursing will continue monitor.        Problem: Moderate/High Fall Risk Score >5  Goal: Patient will remain free of falls  Outcome: Progressing     Problem: Moderate/High Fall Risk Score >5  Goal: Patient will remain free of falls  Outcome: Progressing     Problem: Safety  Goal: Patient will be free from injury during hospitalization  Outcome: Progressing  Goal: Patient will be free from infection during hospitalization  Outcome: Progressing     Problem: Pain  Goal: Pain at adequate level as identified by patient  Outcome: Progressing     Problem: Side Effects from Pain Analgesia  Goal: Patient will experience minimal side effects of analgesic therapy  Outcome: Progressing     Problem: Discharge Barriers  Goal: Patient will be discharged home or other facility with appropriate resources  Outcome: Progressing     Problem: Psychosocial and Spiritual Needs  Goal: Demonstrates ability to cope with hospitalization/illness  Outcome: Progressing     Problem: Compromised Tissue integrity  Goal: Damaged tissue is healing and protected  Outcome: Progressing  Flowsheets (Taken 05/22/2019 1813)  Damaged tissue is healing and protected:   Reposition patient every 2 hours and as needed unless able to reposition self   Increase activity as tolerated/progressive mobility  Goal: Nutritional status is improving  Outcome: Progressing     Problem: Renal Instability  Goal: Fluid and electrolyte balance are achieved/maintained  Outcome: Progressing  Flowsheets (Taken 05/22/2019 1813)  Fluid and electrolyte balance are achieved/maintained:   Provide adequate hydration   Assess and reassess fluid and electrolyte status   Assess for  confusion/personality changes  Goal: Nutritional intake is adequate  Outcome: Progressing  Flowsheets (Taken 05/22/2019 1813)  Nutritional intake is adequate:   Encourage/perform oral hygiene as appropriate   Encourage/administer dietary supplements as ordered (i.e. tube feed, TPN, oral, OGT/NGT, supplements)   Consult/collaborate with Clinical Nutritionist  Goal: Perineal skin integrity is maintained or improved  Outcome: Progressing  Flowsheets (Taken 05/22/2019 1813)  Perineal skin integrity is maintained or improved:   Keep intact skin clean and dry   Use protective skin barriers to decrease potential skin breakdown  Goal: Free from infection  Outcome: Progressing     Problem: Patient Receiving Advanced Renal Therapies  Goal: Therapy access site remains intact  Outcome: Progressing     Problem: Altered GI Function  Goal: Nutritional intake is adequate  Outcome: Progressing  Flowsheets (Taken 05/22/2019 1813)  Nutritional intake is adequate:   Encourage/perform oral hygiene as appropriate   Encourage/administer dietary supplements as ordered (i.e. tube feed, TPN, oral, OGT/NGT, supplements)   Consult/collaborate with Clinical Nutritionist  Goal: Fluid and electrolyte balance are achieved/maintained  Outcome: Progressing  Goal: Elimination patterns are normal or improving  Outcome: Progressing  Goal: Mobility/Activity is maintained at optimal level for patient  Outcome: Progressing  Goal: No bleeding  Outcome: Progressing     Problem: Compromised Hemodynamic Status  Goal: Vital signs and fluid balance maintained/improved  Outcome: Progressing     Problem: Inadequate Gas Exchange  Goal: Adequate oxygenation and improved ventilation  Outcome: Progressing  Goal: Patent Airway maintained  Outcome: Progressing     Problem: Inadequate Airway  Clearance  Goal: Normal respiratory rate/effort achieved/maintained  Outcome: Progressing     Problem: Inadequate Tissue Perfusion-Venous  Goal: Tissue perfusion is  adequate-venous  Outcome: Progressing     Problem: Impaired Mobility  Goal: Mobility/Activity is maintained at optimal level for patient  Outcome: Progressing

## 2019-05-22 NOTE — Plan of Care (Signed)
Problem: Renal Instability  Goal: Fluid and electrolyte balance are achieved/maintained  05/22/2019 1726 by Mickel Baas, RN  Flowsheets (Taken 05/22/2019 1726)  Fluid and electrolyte balance are achieved/maintained:   Monitor daily weight   Assess and reassess fluid and electrolyte status   Observe for cardiac arrhythmias  05/22/2019 0853 by Mickel Baas, RN  Flowsheets (Taken 05/22/2019 [phone])  Fluid and electrolyte balance are achieved/maintained:   Monitor daily weight   Monitor/assess lab values and report abnormal values   Assess and reassess fluid and electrolyte status  Goal: Nutritional intake is adequate  Flowsheets (Taken 05/22/2019 0853)  Nutritional intake is adequate:   Monitor daily weights   Consult/collaborate with Clinical Nutritionist   Allow adequate time for meals  Goal: Free from infection  Flowsheets (Taken 05/22/2019 1726)  Free from infection: Monitor/assess for signs and symptoms of infection     Problem: Patient Receiving Advanced Renal Therapies  Goal: Therapy access site remains intact  05/22/2019 1726 by Mickel Baas, RN  Flowsheets (Taken 05/22/2019 1726)  Therapy access site remains intact:   Assess therapy access site   Change therapy access site dressing as needed  05/22/2019 0853 by Mickel Baas, RN  Flowsheets (Taken 05/22/2019 [phone])  Therapy access site remains intact:   Assess therapy access site   Change therapy access site dressing as needed

## 2019-05-22 NOTE — UM Notes (Addendum)
Primary Coverage: MEDICAID HMO/MEDSTAR FAMILY CHOICE MEDICAID [facility]  CONCURRENT REVIEW May 22, 2019    UTILIZATION REVIEW CONTACT : Shanon Payor BSN, RN, ACM  Utilization Review Case Manager II / Case Management  [facility]  [address]  [place], [place]  T [phone]  Jerilynn Mages [phone]   F [phone]    Email: [email or web address]  NPI: [id number] Tax ID:  [id number]      PATIENT NAME: Edwin,Hiya L  DOB: 09/15/1955    History of present illness: Pt is a 64 y.o. female arrived at [facility] on 05/12/2019 at 1444.and admitted to [facility]    Having placement issues for patient to SNF who provide peritoneal dialysis.        BP 174/83    Pulse 63    Temp 98.2 F (36.8 C) (Oral)    Resp 17    Ht 1.575 m (5' 2.01")    Wt 121 kg (266 lb 12.1 oz)    LMP  (LMP Unknown) Comment: post menopausal   SpO2 99%    BMI 48.78 kg/m     Temp:  [97.5 F (36.4 C)-99 F (37.2 C)]   Heart Rate:  [62-67]   Resp Rate:  [16-17]   BP: (138-174)/(70-83)   SpO2:  [96 %-99 %]   Weight:  [121 kg (266 lb 12.1 oz)]       Abnormal Labs:   Lab Results last 48 Hours     Procedure Component Value Units Date/Time    Glucose Whole Blood - POCT [[id number]]  (Abnormal) Collected: 05/22/19 1127     Updated: 05/22/19 1138     Whole Blood Glucose POCT 132 mg/dL     Glucose Whole Blood - POCT [[id number]]  (Abnormal) Collected: 05/22/19 0728     Updated: 05/22/19 0751     Whole Blood Glucose POCT 280 mg/dL     GFR [[id number]] Collected: 05/22/19 0440     Updated: 05/22/19 0544     EGFR 11.6    Comprehensive metabolic panel [[id number]]  (Abnormal) Collected: 05/22/19 0440    Specimen: Blood Updated: 05/22/19 0544     Glucose 342 mg/dL      BUN 25.0 mg/dL      Creatinine 4.6 mg/dL      Calcium 7.8 mg/dL      Protein, Total 4.9 g/dL      Albumin 1.7 g/dL      Albumin/Globulin Ratio 0.5    CBC without differential [[id number]]  (Abnormal) Collected: 05/22/19 0440     Hgb 9.9 g/dL      Hematocrit 33.1 %      RBC 3.38 x10 6/uL      MCV  97.9 fL      MCHC 29.9 g/dL      Nucleated RBC 0.3 /100 WBC      Absolute NRBC 0.02 x10 3/uL     Glucose Whole Blood - POCT [[id number]]  (Abnormal) Collected: 05/21/19 2033     Updated: 05/21/19 2037     Whole Blood Glucose POCT 168 mg/dL     Glucose Whole Blood - POCT [[id number]]  (Abnormal) Collected: 05/21/19 1657     Updated: 05/21/19 1719     Whole Blood Glucose POCT 263 mg/dL     Glucose Whole Blood - POCT [[id number]]  (Abnormal) Collected: 05/21/19 1210     Updated: 05/21/19 1225     Whole Blood Glucose POCT 102 mg/dL     Glucose Whole Blood - POCT [[id number]]  (Abnormal) Collected: 05/21/19 3383  Updated: 05/21/19 0812     Whole Blood Glucose POCT 282 mg/dL     Basic Metabolic Panel [[id number]]  (Abnormal) Collected: 05/21/19 0425    Specimen: Blood Updated: 05/21/19 0556     Glucose 414 mg/dL      BUN 28.0 mg/dL      Creatinine 4.6 mg/dL      Calcium 7.9 mg/dL     GFR [[id number]] Collected: 05/21/19 0425     Updated: 05/21/19 0556     EGFR 11.6    Comprehensive metabolic panel [[id number]]  (Abnormal) Collected: 05/21/19 0425    Specimen: Blood Updated: 05/21/19 0555     Glucose 415 mg/dL      BUN 27.0 mg/dL      Creatinine 4.6 mg/dL      Calcium 8.1 mg/dL      Protein, Total 4.8 g/dL      Albumin 1.6 g/dL      Albumin/Globulin Ratio 0.5    GFR [[id number]] Collected: 05/21/19 0425     Updated: 05/21/19 0555     EGFR 11.6    CBC without differential [[id number]]  (Abnormal) Collected: 05/21/19 0425     Hgb 9.3 g/dL      Hematocrit 31.4 %      RBC 3.19 x10 6/uL      MCV 98.4 fL      MCHC 29.6 g/dL     Glucose Whole Blood - POCT [[id number]]  (Abnormal) Collected: 05/20/19 2059     Updated: 05/20/19 2102     Whole Blood Glucose POCT 227 mg/dL     Glucose Whole Blood - POCT [[id number]]  (Abnormal) Collected: 05/20/19 1653     Updated: 05/20/19 1658     Whole Blood Glucose POCT 179 mg/dL           MD Notes:  Nephrology note 05/22/2019  Assessment:   1. PD associated peritonitis - E Coli              - cell count  improving              - no need to remove catheter  2. ESRD- on CAPD  3. Anemia- CKD + Fe deficiency now exacerbated by GI bleed  4. Volume overload: improving slowly with PD  5. Hypokalemia  6. Hypomagnesemia - resolved  Recommendations:   Continue current PD orders  Case discussed with:  Pt, HD-RN  Armando Reichert, MD   [facility]  [phone] (office)  X [phone] ([place])    Discharge summary 05/22/2019  DISCHARGE SUMMARY     Discharge Information   Admission Diagnosis:   AKI (acute kidney injury)    Discharge Diagnosis:       Active Hospital Problems    Diagnosis    Iron deficiency anemia secondary to inadequate dietary iron intake    Sideropenic dysphagia    AKI (acute kidney injury)        Admission Condition: Guarded   Discharge Condition: Stable   Consultants: GI, Nephrology   Functional Status: Ambulatory   Discharged to: [facility]   Presentation History   Per Dr Ann Maki "Leone Payor McKethanis a 64 y.o.femalewith history ofhypertension, hyperlipidemia, insulin-dependent diabetes mellitus, COPD, end-stage renal disease on peritoneal dialysiswho presents to the emergency department for evaluation of a 3-day history of intractable nausea,vomiting and nonbloody diarrhea. She states that she ate an ultimate omelette at [facility] on 4/16/21and following that her symptoms started.She complains of constant diffuseabdominal pain  described as achingpain, reaching10 out of 10 in severity which is exacerbated by movement. She also reports a fever 102 last night along with chills,feeling dizzy and near syncope, generalized weakness. She reports no history of renal stones. She does report having a UTI in the past.  She states that she had noted blood in one of her episodes of emesis. She states that she was hospitalized 1 month ago at [facility] where she had an upper endoscopy but she cannotrecall the results.  States that she was started on hemodialysis  last May 2020 and after that she was transitioned to peritoneal dialysis at home. She states that she intermittently makes urine and last week she had frequent urination.  She reports noheadache,visual changes,syncope, loss of consciousness, hematochezia, melena, dysuria, hematuria, numbness/tingling, focal weakness.   In the ED, patient was hypertensive on presentation, afebrile, not tachycardic or hypoxic. Labs significant for glucose of 551, creatinine 4.7. She received 3 L normal saline bolus and vancomycin and Zosyn in the ED    See HPI for details.    Hospital Course (10 Days)   Lynise Porr McKethanis a 63 y.o.femalewith history ofhypertension, hyperlipidemia, insulin-dependent diabetes mellitus, COPD, end-stage renal disease on peritoneal dialysiswho presents to the emergency department for evaluation of a 3-day history of intractable nausea,vomiting and nonbloody diarrhea, admitted for viral gastroenteritis and UTI.    #UGIB - Patient had 1 episode of 400 cc coffee ground emesis on 4/20 per RN  #Possible melena per patient report  -s/p EGD on 4/21: Grade D gastritis  -Continue Protonix 40 mg BID  -Ok to restart ASA and pletal per GI  -H/H stable   -GI recommendations appreciated. Outpatient follow-up for repeat EGD.    Chest pain  -EKG NSR, troponin x2 negative.  -D dimer elevated, CTA chest negative for PE.     #GNR Peritonitis, pansensitive   #Abdominal pain  #Nausea, vomiting, diarrhea - Resolved  #UTI  -Likelydue toviral gastroenteritis/UTI/Peritonitis  -Peritoneal fluid with 51,000 WBC's.Improved to 200s  -Blood cultures NGTD, lactic acid normal and lipase normal  -Urine culture: >100,000 CFU/ML of multiple bacterial morphotypes   -CT abd/pelvis wo contrastrevealsno evidence of bowel obstruction, bowel ischemia, perforation, diverticulitis, pancreatitis, nephrolithiasis and shows intraperitoneal fluid  -PRN antiemetics for nausea/vomiting  -Continue IPcefazolinwith PD exchanges.  Nephrology will be placing orders for this.   -ID recommendations appreciated    #Hypokalemia  #Hypomagnesemia  -Resolved    #ESRD on peritoneal dialysis  -Started on dialysis on 05/2018  -CT abd/pelvis shows moderate amount of ascites ? Dialysis fluid  -Under care everywhere, noted that Cr from 10/2018-11/2018 has been 3.8-5 range  -Continue PD with IP antibiotics  -Repeat cell count per nephrology   -Nephrology following    #Hypertension  -Continue home amlodipine, coreg    #Hyperglycemia  #Type 2 DM  -Continue home lantus, SSI, ac/hs accuchecks  -HbA1c 9.8%    #Hyperlipidemia   -Continue home statin    #Iron deficiency anemia  #Anemia of Chronic disease   -Completed IV Venofer 3/3  -S/p 1 unit pRBC transfusion     [place] instructions given     Return instructions given     No changes in clinical course over the last 24 hours. Delay in [place] due to pt awaiting placement.     Procedures/Imaging:   CT Angiogram Chest    Result Date: 05/18/2019     No evidence of pulmonary embolism. Small bilateral pleural effusions. Large hiatal hernia. Trace perihepatic ascites. Other nonurgent findings  as above. Lonia Skinner, MD  05/18/2019 9:58 AM       Nephrology note 05/21/2019  CC: follow-up ESRD  PD note  Assessment:   1. PD associated peritonitis - E Coli  - cell count improving  - no need to remove catheter  2. ESRD- on CAPD  3. Anemia- CKD + Fe deficiency now exacerbated by GI bleed  4. Volume overload  5. Hypokalemia  6. Hypomagnesemia - resolved  Recommendations:   1. Stop KCL  2. Will need abx for a total of 3 weeks  3.PDtonight per orders;will continue to use 1 bag of 4.25% given edema  Case discussed with:  Pt, HD-RN, attending physician        Medications: Scheduled Meds:  Current Facility-Administered Medications   Medication Dose Route Frequency    amLODIPine  5 mg Oral Q12H    atorvastatin  40 mg Oral Daily    carvedilol  25 mg Oral Q12H [place]    darbepoetin alfa  60 mcg Subcutaneous Weekly    peritoneal  dialysis with additives   Peritoneal catheter Q24H    famotidine  10 mg Oral Q12H SCH    insulin glargine  15 Units Subcutaneous QHS    insulin lispro  1-4 Units Subcutaneous QHS    insulin lispro  1-8 Units Subcutaneous TID AC    insulin lispro  3 Units Subcutaneous TID MEALS    lactulose  30 g Oral Q8H SCH    pantoprazole  40 mg Oral BID    sevelamer  800 mg Oral TID MEALS     Continuous Infusions:   sodium chloride 100 mL/hr at 05/14/19 1828    dianeal low calcium (2.5 mEq/L) 4.25% dextrose      dianeal PD-2 (calcium 3.5 mEq/L) 2.5% dextrose      lactated ringers Stopped (05/15/19 1409)     PRN Meds:.sodium chloride, bisacodyl, Nursing communication: Adult Hypoglycemia Treatment Algorithm **AND** dextrose **AND** dextrose **AND** glucagon (rDNA), naloxone, ondansetron **OR** ondansetron

## 2019-05-22 NOTE — Progress Notes (Signed)
[facility] PROGRESS NOTE  Aaron Edelman, x [phone] [facility] Spectralink)      Date Time: 05/22/19 7:11 AM  Patient Name: [name]  Attending Physician: Ardeth Sportsman, MD    CC: follow-up ESRD  PD note    Assessment:     1. PD associated peritonitis - E Coli   - cell count improving   - no need to remove catheter  2. ESRD- on CAPD  3. Anemia- CKD + Fe deficiency now exacerbated by GI bleed  4. Volume overload: improving slowly with PD  5. Hypokalemia  6. Hypomagnesemia - resolved        Recommendations:     Continue current PD orders    Case discussed with:  Pt, HD-RN  Armando Reichert, MD   [facility]  [phone] (office)  X [phone] ([facility])      Subjective: Resting comfortably      Review of Systems:   Cardio: no chest pain, no edema  Pulm: no SOB  Abd: no tenderness to palpation      Physical Exam:     Vitals:    05/21/19 1659 05/21/19 1939 05/21/19 2332 05/22/19 0436   BP: 149/78 138/70 160/74 167/74   Pulse: 62 65 64 67   Resp: 17 17 16 16    Temp: 99 F (37.2 C) 97.5 F (36.4 C) 98.2 F (36.8 C) 98.2 F (36.8 C)   TempSrc: Oral Oral Oral Oral   SpO2: 98% 96% 97% 97%   Weight:    121 kg (266 lb 12.1 oz)   Height:           Intake and Output Summary (Last 24 hours) at Date Time    Intake/Output Summary (Last 24 hours) at 05/22/2019 3785  Last data filed at 05/22/2019 8850  Gross per 24 hour   Intake 1520 ml   Output -1599 ml   Net 3119 ml       General: obese female, somnolent  Cardiovascular: regular rate and rhythm  Lungs: clear to auscultation bilaterally, bilateral air entry,normal work of breathing  Abdomen: soft, NT,ND;LLQ PD catheter  Extremities: improved edema      Meds:      Scheduled Meds: PRN Meds:    amLODIPine, 5 mg, Oral, Q12H  atorvastatin, 40 mg, Oral, Daily  carvedilol, 25 mg, Oral, Q12H SCH  darbepoetin alfa, 60 mcg, Subcutaneous, Weekly  peritoneal dialysis with additives, , Peritoneal catheter, Q24H  famotidine, 10 mg, Oral, Q12H SCH  insulin  glargine, 15 Units, Subcutaneous, QHS  insulin lispro, 1-4 Units, Subcutaneous, QHS  insulin lispro, 1-8 Units, Subcutaneous, TID AC  insulin lispro, 3 Units, Subcutaneous, TID MEALS  lactulose, 30 g, Oral, Q8H SCH  pantoprazole, 40 mg, Oral, BID  sevelamer, 800 mg, Oral, TID MEALS          Continuous Infusions:   sodium chloride 100 mL/hr at 05/14/19 1828    dianeal low calcium (2.5 mEq/L) 4.25% dextrose      dianeal PD-2 (calcium 3.5 mEq/L) 2.5% dextrose      lactated ringers Stopped (05/15/19 1409)    sodium chloride, , PRN  bisacodyl, 10 mg, Daily PRN  dextrose, 15 g of glucose, PRN   And  dextrose, 12.5 g, PRN   And  glucagon (rDNA), 1 mg, PRN  naloxone, 0.2 mg, PRN  ondansetron, 4 mg, Q6H PRN   Or  ondansetron, 4 mg, Q6H PRN  Labs:     Recent Labs   Lab 05/22/19  0440 05/21/19  0425 05/20/19  0500   WBC 7.78 7.54 8.21   Hgb 9.9* 9.3* 9.8*   Hematocrit 33.1* 31.4* 32.5*   Platelets 225 199 195     Recent Labs   Lab 05/22/19  0440 05/21/19  0425 05/20/19  0323 05/18/19  0844 05/18/19  0844 05/17/19  1337 05/17/19  0939 05/17/19  0939 05/16/19  0354 05/16/19  0354   Sodium 139 140   140 137  More results in Results Review 140  --   More results in Results Review 141  More results in Results Review 138   Potassium 4.4 4.5   4.6 4.1  More results in Results Review 4.2  --   More results in Results Review 3.0*  More results in Results Review 3.1*   Chloride 103 102   102 100  More results in Results Review 102  --   More results in Results Review 103  More results in Results Review 100   CO2 27 29   29 28   More results in Results Review 32*  --   More results in Results Review 28  More results in Results Review 28   BUN 25.0* 28.0*   27.0* 25.0*  More results in Results Review 25.0*  --   More results in Results Review 24.0*  More results in Results Review 30.0*   Creatinine 4.6* 4.6*   4.6* 4.2*  More results in Results Review 4.4*  --   More results in Results Review 4.3*  More results in Results  Review 4.5*   Calcium 7.8* 7.9*   8.1* 8.1*  More results in Results Review 7.8*  --   More results in Results Review 7.4*  More results in Results Review 7.2*   Albumin 1.7* 1.6* 1.7*  More results in Results Review 1.4*  --   More results in Results Review 1.4*  More results in Results Review 1.5*   Phosphorus  --   --   --   --  3.6  --   --  3.8  --   --    Magnesium  --   --   --   --   --  1.8  --  1.7  --  1.4*   Glucose 342* 414*   415* 373*  More results in Results Review 255*  --   More results in Results Review 164*  More results in Results Review 409*   EGFR 11.6 11.6   11.6 12.9  More results in Results Review 12.2  --   More results in Results Review 12.5  More results in Results Review 11.9   More results in Results Review = values in this interval not displayed.           Signed by: Armando Reichert, MD

## 2019-05-22 NOTE — Progress Notes (Signed)
CCPD completed w/ no flow issue  Overnight; Tot. UF 623 ml.; Dressing d/i; Report given to Orbie Hurst RN.     05/22/19 0845   Completing APD/CCPD (Taking the Patient OFF)    Effluent Appearance Amber;Clear   Specimen Collected No   Last Fill Volume (mL) 2500 mL   Volume Collected in Drainage Bag (mL) 613 mL   Peritoneal Dialysis Comments CCPD completed   Peritoneal Dialysis Catheter   No Placement Date or Time found.   Present on Admission?: Yes  Catheter Type: Baxter   Site Assessment Clean;Dry;Intact   Status Clamped   Dressing Occlusive

## 2019-05-23 ENCOUNTER — Encounter
Admission: EM | Disposition: A | Discharge status: Home / Self Care | Payer: Self-pay | Source: Home / Self Care | Attending: Student in an Organized Health Care Education/Training Program

## 2019-05-23 HISTORY — PX: TUNNELED CATH PLACEMENT (PERMCATH): IMG2664

## 2019-05-23 LAB — CBC
Absolute NRBC: 0.05 10*3/uL — ABNORMAL HIGH (ref 0.00–0.00)
Hematocrit: 32.8 % — ABNORMAL LOW (ref 34.7–43.7)
Hgb: 9.8 g/dL — ABNORMAL LOW (ref 11.4–14.8)
MCH: 28.8 pg (ref 25.1–33.5)
MCHC: 29.9 g/dL — ABNORMAL LOW (ref 31.5–35.8)
MCV: 96.5 fL — ABNORMAL HIGH (ref 78.0–96.0)
MPV: 11.7 fL (ref 8.9–12.5)
Nucleated RBC: 0.6 /100 WBC — ABNORMAL HIGH (ref 0.0–0.0)
Platelets: 225 10*3/uL (ref 142–346)
RBC: 3.4 10*6/uL — ABNORMAL LOW (ref 3.90–5.10)
RDW: 13 % (ref 11–15)
WBC: 8.64 10*3/uL (ref 3.10–9.50)

## 2019-05-23 LAB — COMPREHENSIVE METABOLIC PANEL
ALT: <6 U/L (ref 0–55)
AST (SGOT): 14 U/L (ref 5–34)
Albumin/Globulin Ratio: 0.6 — ABNORMAL LOW (ref 0.9–2.2)
Albumin: 1.7 g/dL — ABNORMAL LOW (ref 3.5–5.0)
Alkaline Phosphatase: 105 U/L (ref 37–106)
Anion Gap: 10 (ref 5.0–15.0)
BUN: 24 mg/dL — ABNORMAL HIGH (ref 7.0–19.0)
Bilirubin, Total: 0.2 mg/dL (ref 0.2–1.2)
CO2: 28 mEq/L (ref 22–29)
Calcium: 7.9 mg/dL — ABNORMAL LOW (ref 8.5–10.5)
Chloride: 102 mEq/L (ref 100–111)
Creatinine: 4.3 mg/dL — ABNORMAL HIGH (ref 0.6–1.0)
Globulin: 3 g/dL (ref 2.0–3.6)
Glucose: 316 mg/dL — ABNORMAL HIGH (ref 70–100)
Potassium: 3.7 mEq/L (ref 3.5–5.1)
Protein, Total: 4.7 g/dL — ABNORMAL LOW (ref 6.0–8.3)
Sodium: 140 mEq/L (ref 136–145)

## 2019-05-23 LAB — HEMOLYSIS INDEX: Hemolysis Index: 1 (ref 0–18)

## 2019-05-23 LAB — GLUCOSE WHOLE BLOOD - POCT
Whole Blood Glucose POCT: 230 mg/dL — ABNORMAL HIGH (ref 70–100)
Whole Blood Glucose POCT: 194 mg/dL — ABNORMAL HIGH (ref 70–100)
Whole Blood Glucose POCT: 125 mg/dL — ABNORMAL HIGH (ref 70–100)
Whole Blood Glucose POCT: 279 mg/dL — ABNORMAL HIGH (ref 70–100)

## 2019-05-23 LAB — GFR: EGFR: 12.5

## 2019-05-23 SURGERY — TUNNELED CATH PLACEMENT
Anesthesia: IV Sedation | Laterality: Bilateral

## 2019-05-23 MED ORDER — SODIUM CHLORIDE 0.9 % IV BOLUS
100.0000 mL | INTRAVENOUS | Status: AC | PRN
Start: 2019-05-23 — End: 2019-05-23

## 2019-05-23 MED ORDER — MIDAZOLAM HCL 1 MG/ML IJ SOLN (WRAP)
INTRAMUSCULAR | Status: AC | PRN
Start: 2019-05-23 — End: 2019-05-23
  Administered 2019-05-23: 0.5 mg via INTRAVENOUS

## 2019-05-23 MED ORDER — SODIUM CHLORIDE 0.9 % IV BOLUS
250.0000 mL | INTRAVENOUS | Status: AC | PRN
Start: 2019-05-23 — End: 2019-05-23

## 2019-05-23 MED ORDER — FENTANYL CITRATE (PF) 50 MCG/ML IJ SOLN (WRAP)
INTRAMUSCULAR | Status: AC | PRN
Start: 2019-05-23 — End: 2019-05-23
  Administered 2019-05-23: 25 ug via INTRAVENOUS

## 2019-05-23 MED ORDER — LIDOCAINE HCL 1 % IJ SOLN
INTRAMUSCULAR | Status: AC | PRN
Start: 2019-05-23 — End: 2019-05-23
  Administered 2019-05-23: 10 mL

## 2019-05-23 MED ORDER — ACETAMINOPHEN 325 MG PO TABS
650.0000 mg | ORAL_TABLET | Freq: Four times a day (QID) | ORAL | Status: DC | PRN
Start: 2019-05-23 — End: 2019-05-26
  Administered 2019-05-23 – 2019-05-25 (×3): 650 mg via ORAL
  Filled 2019-05-23 (×3): qty 2

## 2019-05-23 MED ORDER — LIDOCAINE HCL 1 % IJ SOLN
INTRAMUSCULAR | Status: AC
Start: 2019-05-23 — End: ?
  Filled 2019-05-23: qty 20

## 2019-05-23 MED ORDER — LIDOCAINE-EPINEPHRINE 1.5 %-1:200000 IJ SOLN
INTRAMUSCULAR | Status: AC
Start: 2019-05-23 — End: ?
  Filled 2019-05-23: qty 30

## 2019-05-23 MED ORDER — MIDAZOLAM HCL 1 MG/ML IJ SOLN (WRAP)
INTRAMUSCULAR | Status: AC
Start: 2019-05-23 — End: ?
  Filled 2019-05-23: qty 5

## 2019-05-23 MED ORDER — HYDRALAZINE HCL 10 MG PO TABS
10.0000 mg | ORAL_TABLET | Freq: Once | ORAL | Status: AC
Start: 2019-05-23 — End: 2019-05-23
  Administered 2019-05-23: 02:00:00 10 mg via ORAL
  Filled 2019-05-23: qty 1

## 2019-05-23 MED ORDER — FENTANYL CITRATE (PF) 50 MCG/ML IJ SOLN (WRAP)
INTRAMUSCULAR | Status: AC
Start: 2019-05-23 — End: ?
  Filled 2019-05-23: qty 5

## 2019-05-23 MED ORDER — DIANEAL LOW CALCIUM/1.5% DEX 344 MOSM/L IP SOLN
INTRAPERITONEAL | Status: DC
Start: 2019-05-23 — End: 2019-05-24
  Filled 2019-05-23 (×2): qty 5000

## 2019-05-23 MED ORDER — HEPARIN SODIUM (PORCINE) 5000 UNIT/ML IJ SOLN
5000.00 [IU] | Freq: Two times a day (BID) | INTRAMUSCULAR | Status: DC
Start: 2019-05-23 — End: 2019-05-23

## 2019-05-23 NOTE — Plan of Care (Signed)
Problem: Moderate/High Fall Risk Score >5  Goal: Patient will remain free of falls  Outcome: Progressing  Flowsheets (Taken 05/23/2019 2001)  High (Greater than 13):   HIGH-Visual cue at entrance to patient's room   HIGH-Bed alarm on at all times while patient in bed   HIGH-Apply yellow "Fall Risk" arm band   HIGH-Initiate use of floor mats as appropriate   HIGH-Consider use of low bed     Problem: Safety  Goal: Patient will be free from injury during hospitalization  Outcome: Progressing  Flowsheets (Taken 05/23/2019 2001)  Patient will be free from injury during hospitalization:   Assess patient's risk for falls and implement fall prevention plan of care per policy   Provide and maintain safe environment   Use appropriate transfer methods   Ensure appropriate safety devices are available at the bedside   Include patient/ family/ care giver in decisions related to safety   Hourly rounding   Assess for patients risk for elopement and implement Elopement Risk Plan per policy  Goal: Patient will be free from infection during hospitalization  Outcome: Progressing  Flowsheets (Taken 05/23/2019 2001)  Free from Infection during hospitalization:   Assess and monitor for signs and symptoms of infection   Monitor lab/diagnostic results   Monitor all insertion sites (i.e. indwelling lines, tubes, urinary catheters, and drains)   Encourage patient and family to use good hand hygiene technique     Problem: Pain  Goal: Pain at adequate level as identified by patient  Outcome: Progressing  Flowsheets (Taken 05/23/2019 2001)  Pain at adequate level as identified by patient:   Identify patient comfort function goal   Assess pain on admission, during daily assessment and/or before any "as needed" intervention(s)   Evaluate if patient comfort function goal is met   Include patient/patient care companion in decisions related to pain management as needed     Problem: Psychosocial and Spiritual Needs  Goal: Demonstrates ability to cope  with hospitalization/illness  Outcome: Progressing  Flowsheets (Taken 05/23/2019 2001)  Demonstrates ability to cope with hospitalizations/illness:   Encourage verbalization of feelings/concerns/expectations   Provide quiet environment   Include patient/ patient care companion in decisions   Reinforce positive adaptation of new coping behaviors     Problem: Compromised Tissue integrity  Goal: Nutritional status is improving  Outcome: Progressing  Flowsheets (Taken 05/23/2019 2001)  Nutritional status is improving:   Assist patient with eating   Include patient/patient care companion in decisions related to nutrition     Problem: Renal Instability  Goal: Fluid and electrolyte balance are achieved/maintained  Outcome: Progressing  Flowsheets (Taken 05/23/2019 2001)  Fluid and electrolyte balance are achieved/maintained:   Monitor intake and output every shift   Monitor/assess lab values and report abnormal values   Provide adequate hydration  Goal: Free from infection  Outcome: Progressing  Flowsheets (Taken 05/23/2019 2001)  Free from infection:   Monitor/assess for signs and symptoms of infection   Assess need for indwelling catheter every shift and discuss with LIP

## 2019-05-23 NOTE — Progress Notes (Signed)
Pt returned from [place] on stable condition. Site assessed, no bleeding noted.

## 2019-05-23 NOTE — Nursing Progress Note (Signed)
Medicine Nursing Note    Patient Name: [name]  Attending Physician: Ardeth Sportsman, MD    Assessment     Shift event:    Neuro: AOx4, calm and cooperative    Tele/CV/CP 24hr  Tele, VSS, RA, Afebrile   Foley/GU Pt is anuric, has peritoneal dialysis cath   BMs/GI Last BM on 4/27 , on cho and renal 80g restricton   Skin: Intact, warm and dry   Isolation: NA   Pain: Denied pain/discomfort   Mews: 1   Central Line: NA   Safety:  Placed ____high___ fall risk. Safety maintained, purposeful rounding performed, Call bell within reach.    Interpreter: Pt speaks English and denies interpreter needs.   Comments:  Fall bundle is in place. Blood sugar was in the 300s, coverage was given. Vitals have been stable, no SOB or dizziness. Has been NSR. CHG bath was done. Blood pressure was in the high 170s, paged MD and gave her po hydralazine. SBP went down to the 150s. Pt continues to refuse the latulose saying that she had a bowel movement yesterday. Will continue to monitor her.

## 2019-05-23 NOTE — Progress Notes (Signed)
[facility]  CVIR   Progress Note       64 y/o female with ESRD here for PD associated peritonitis- E.coli. Patient previously on PD, would now like to be on HD. CVIR requested by Nephrology for a tunneled hemodialysis catheter placement today. Patient hemodynamically stable. Currently afebrile. Patient denies CP, SOB, N/V/D, fever or chills.    Discussion was held with the patient regarding her diagnosis and procedural treatment options.  This discussion included risks, benefits and alternative treatment options. Following the discussion, all of her questions have been answered and she wishes to proceed with the recommended treatment.    PLAN:     TDC placement in CVIR today.    Continue NPO status.   Continue medical management per primary medicine team.     Donovan Kail, NP  [facility]  [phone]

## 2019-05-23 NOTE — Discharge Summary (Addendum)
[facility]      Patient: [name]  Admission Date: 05/12/2019   DOB: 05/26/1955  Discharge Date: 05/23/2019    MRN: [id number]  Discharge Attending:Landra Howze D [place]     Referring Physician: No primary care provider on file.  PCP: No primary care provider on file.       DISCHARGE SUMMARY     Discharge Information   Admission Diagnosis:   AKI (acute kidney injury)    Discharge Diagnosis:   Active Hospital Problems    Diagnosis    Iron deficiency anemia secondary to inadequate dietary iron intake    Sideropenic dysphagia    AKI (acute kidney injury)        Admission Condition: Guarded   Discharge Condition: Stable   Consultants: GI, Nephrology   Functional Status: Ambulatory   Discharged to: SNF     Discharge Medications:     Medication List      START taking these medications    amLODIPine 5 MG tablet  Commonly known as: NORVASC  Take 1 tablet (5 mg total) by mouth every 12 (twelve) hours     insulin glargine 100 UNIT/ML injection  Commonly known as: LANTUS  Inject 15 Units into the skin nightly     pantoprazole 40 MG tablet  Commonly known as: PROTONIX  Take 1 tablet (40 mg total) by mouth 2 (two) times daily        CONTINUE taking these medications    aspirin EC 81 MG EC tablet     atorvastatin 40 MG tablet  Commonly known as: LIPITOR     carvedilol 25 MG tablet  Commonly known as: COREG     cilostazol 100 MG tablet  Commonly known as: PLETAL     famotidine 40 MG tablet  Commonly known as: PEPCID     furosemide 40 MG tablet  Commonly known as: LASIX     gabapentin 600 MG tablet  Commonly known as: NEURONTIN     insulin aspart 100 UNIT/ML injection  Commonly known as: NovoLOG     oxyCODONE-acetaminophen 5-325 MG per tablet  Commonly known as: PERCOCET     sevelamer 800 MG tablet  Commonly known as: RENVELA        STOP taking these medications    amLODIPine-atorvastatin 10-10 MG per tablet  Commonly known as: CADUET           Where to Get Your Medications      Information about where to get these  medications is not yet available    Ask your nurse or doctor about these medications   amLODIPine 5 MG tablet   insulin glargine 100 UNIT/ML injection   pantoprazole 40 MG tablet             Hospital Course   Presentation History   Per Dr Ann Maki "[name] is a 64 y.o. female with history of hypertension, hyperlipidemia, insulin-dependent diabetes mellitus, COPD, end-stage renal disease on peritoneal dialysis who presents to the emergency department for evaluation of a 3-day history of intractable nausea, vomiting and nonbloody diarrhea.  She states that she ate an ultimate omelette at [facility] on 05/09/19 and following that her symptoms started.  She complains of constant diffuse abdominal pain described as aching pain, reaching 10 out of 10 in severity which is exacerbated by movement.  She also reports a fever 102 last night along with chills, feeling dizzy and near syncope, generalized weakness. She reports no history of renal stones.  She  does report having a UTI in the past.  She states that she had noted blood in one of her episodes of emesis.  She states that she was hospitalized 1 month ago at [facility] where she had an upper endoscopy but she cannot recall the results.  States that she was started on hemodialysis last May 2020 and after that she was transitioned to peritoneal dialysis at home.  She states that she intermittently makes urine and last week she had frequent urination.  She reports no headache, visual changes, syncope, loss of consciousness, hematochezia, melena, dysuria, hematuria, numbness/tingling, focal weakness.   In the ED, patient was hypertensive on presentation, afebrile, not tachycardic or hypoxic.  Labs significant for glucose of 551, creatinine 4.7.  She received 3 L normal saline bolus and vancomycin and Zosyn in the ED    See HPI for details.    Hospital Course (11 Days)   [name] a 64 y.o.femalewith history ofhypertension, hyperlipidemia,  insulin-dependent diabetes mellitus, COPD, end-stage renal disease on peritoneal dialysiswho presents to the emergency department for evaluation of a 3-day history of intractable nausea,vomiting and nonbloody diarrhea, admitted for viral gastroenteritis and UTI.    #UGIB - Patient had 1 episode of 400 cc coffee ground emesis on 4/20 per RN  #Possible melena per patient report  -s/p EGD on 4/21: Grade D gastritis  -Continue Protonix 40 mg BID  -Ok to restart ASA and pletal per GI  -H/H stable   -GI recommendations appreciated. Outpatient follow-up for repeat EGD.    Chest pain  -EKG NSR, troponin x2 negative.  -D dimer elevated, CTA chest negative for PE.     #GNR Peritonitis, pansensitive   #Abdominal pain  #Nausea, vomiting, diarrhea - Resolved  #UTI  -Likelydue toviral gastroenteritis/UTI/Peritonitis  -Peritoneal fluid with 51,000 WBC's. Improved to 200s   -Blood cultures NGTD, lactic acid normal and lipase normal  -Urine culture: >100,000 CFU/ML of multiple bacterial morphotypes   -CT abd/pelvis wo contrastrevealsno evidence of bowel obstruction, bowel ischemia, perforation, diverticulitis, pancreatitis, nephrolithiasis and shows intraperitoneal fluid  -PRN antiemetics for nausea/vomiting  -Continue IP cefazolin with PD exchanges. Nephrology will be placing orders for this.   -ID recommendations appreciated    #Hypokalemia  #Hypomagnesemia  -Resolved     #ESRD on peritoneal dialysis  -Started on dialysis on 05/2018  -CT abd/pelvis shows moderate amount of ascites ? Dialysis fluid  -Under care everywhere, noted that Cr from 10/2018-11/2018 has been 3.8-5 range  -Continue PD with IP antibiotics  -Pt now wanting HD over PD. CVIR consult for tunneled catheter placement.   -Repeat cell count per nephrology   -Nephrology following    #Hypertension  -Continue home amlodipine, coreg    #Hyperglycemia  #Type 2 DM  -Continue home lantus, SSI, ac/hs accuchecks  -HbA1c 9.8%    #Hyperlipidemia   -Continue home  statin    #Iron deficiency anemia  #Anemia of Chronic disease   -Completed IV Venofer 3/3  -S/p 1 unit pRBC transfusion     [place] [place] instructions given     Return instructions given     No changes in clinical course over the last 24 hours. Delay in [place] due to pt awaiting placement.     Procedures/Imaging:   CT Angiogram Chest    Result Date: 05/18/2019     No evidence of pulmonary embolism. Small bilateral pleural effusions. Large hiatal hernia. Trace perihepatic ascites. Other nonurgent findings as above. Lonia Skinner, MD  05/18/2019 9:58 AM  Best Practices   Was the patient admitted with either a CHF Exacerbation or Pneumonia? No     Progress Note/Physical Exam at Discharge     Subjective: Pt seen and examined at bedside.   Pt sitting up on chair.   Denies cp, sob.   Reports a little abdominal pain  Denies nausea/vomiting.     Vitals:    05/23/19 0040 05/23/19 0340 05/23/19 0707 05/23/19 0900   BP: 178/71 152/74 155/86 155/86   Pulse: 64  62 62   Resp: 16 16 19     Temp: 99 F (37.2 C) 99.1 F (37.3 C) 98.1 F (36.7 C)    TempSrc: Oral Oral Oral    SpO2: 99% 99% 99%    Weight:  112 kg (247 lb)     Height:           General: NAD, AAOx3, sitting on chair   HEENT: perrla, eomi, sclera anicteric, OP: Clear, MMM  Neck: supple, FROM, no LAD  Cardiovascular: RRR, no m/r/g  Lungs: CTAB, no w/r/r  Abdomen: soft, +BS, NT/ND, no masses, no g/r  Extremities: no C/C/E  Skin: no rashes or lesions noted  Neuro: non focal, moves all extremities        Diagnostics     Labs/Studies Pending at Discharge: No    Last Labs   Recent Labs   Lab 05/23/19  0451 05/22/19  0440 05/21/19  0425   WBC 8.64 7.78 7.54   RBC 3.40* 3.38* 3.19*   Hgb 9.8* 9.9* 9.3*   Hematocrit 32.8* 33.1* 31.4*   MCV 96.5* 97.9* 98.4*   Platelets 225 225 199       Recent Labs   Lab 05/23/19  0451 05/22/19  0440 05/21/19  0425 05/20/19  0323 05/18/19  0844 05/17/19  1337 05/17/19  0939 05/17/19  0939   Sodium 140 139 140   140 137 140  --   More results in Results  Review 141   Potassium 3.7 4.4 4.5   4.6 4.1 4.2  --   More results in Results Review 3.0*   Chloride 102 103 102   102 100 102  --   More results in Results Review 103   CO2 28 27 29   29 28  32*  --   More results in Results Review 28   BUN 24.0* 25.0* 28.0*   27.0* 25.0* 25.0*  --   More results in Results Review 24.0*   Creatinine 4.3* 4.6* 4.6*   4.6* 4.2* 4.4*  --   More results in Results Review 4.3*   Glucose 316* 342* 414*   415* 373* 255*  --   More results in Results Review 164*   Calcium 7.9* 7.8* 7.9*   8.1* 8.1* 7.8*  --   More results in Results Review 7.4*   Magnesium  --   --   --   --   --  1.8  --  1.7   More results in Results Review = values in this interval not displayed.       Microbiology Results     Procedure Component Value Units Date/Time    Blood Culture Aerobic/Anaerobic #1 [[id number]] Collected: 05/12/19 1855    Specimen: Arm from Blood, Venipuncture Updated: 05/18/19 0021    Narrative:      ORDER#: [id number]  ORDERED BY: KUMAR, VINOD  SOURCE: Blood, Venipuncture Arm                      COLLECTED:  05/12/19 18:55  ANTIBIOTICS AT COLL.:                                RECEIVED :  05/12/19 21:53  Culture Blood Aerobic and Anaerobic        FINAL       05/18/19 00:21  05/18/19   No growth after 5 days of incubation.      Blood Culture Aerobic/Anaerobic #2 [[id number]] Collected: 05/12/19 1855    Specimen: Arm from Blood, Venipuncture Updated: 05/18/19 0021    Narrative:      ORDER#: [id number]                                    ORDERED BY: Dwyane Dee, VINOD  SOURCE: Blood, Venipuncture Arm                      COLLECTED:  05/12/19 18:55  ANTIBIOTICS AT COLL.:                                RECEIVED :  05/12/19 21:53  Culture Blood Aerobic and Anaerobic        FINAL       05/18/19 00:21  05/18/19   No growth after 5 days of incubation.      COVID-19 (SARS-COV-2) [facility]) [[id number]] Collected: 05/12/19 1611    Specimen: Nasopharyngeal Swab from Nasopharynx  Updated: 05/12/19 1648     Purpose of COVID testing Screening     SARS-CoV-2 Specimen Source Nasopharyngeal     SARS CoV 2 Overall Result Negative     Comment: Test performed using the Abbott ID NOW EUA assay.  Please see Fact Sheets for patients and providers located at:  http://olson-hall.info/  This test is for the qualitative detection of SARS-CoV-2  (COVID19) nucleic acid. Viral nucleic acids may persist in vivo,  independent of viability. Detection of viral nucleic acid does  not imply the presence of infectious virus, or that virus  nucleic acid is the cause of clinical symptoms. Negative  results should be treated as presumptive and, if inconsistent  with clinical signs and symptoms or necessary for patient  management, should be tested with an alternative molecular  assay. Negative results do not preclude SARS-CoV-2 infection  and should not be used as the sole basis for patient  management decisions. Invalid results may be due to inhibiting  substances in the specimen and recollection should occur.         Narrative:      o Collect and clearly label specimen type:  o Upper respiratory specimen: One Nasopharyngeal Dry Swab NO  Transport Media.  o Hand deliver to laboratory ASAP  Indication for testing->Extended care facility admission to  semi private room    Culture + Gram Lenise Arena, Body Fluid [[id number]] Collected: 05/13/19 1447    Specimen: Body Fluid from Ascites Fluid Updated: 05/15/19 1818    Narrative:      [id number]_ called Micro Results of fluid. Results read back [id number], by [id number]  on 05/15/2019 at 18:17  ORDER#: [id number]  ORDERED BY: RANA, IRMINDRA  SOURCE: Ascites Fluid ascites                        COLLECTED:  05/13/19 14:47  ANTIBIOTICS AT COLL.:                                RECEIVED :  05/13/19 19:33  [id number]_ called Micro Results of fluid. Results read back [id number], by [id number] on 05/15/2019 at 18:17  Stain, Gram                                 FINAL       05/13/19 21:48  05/13/19   Many WBCs             No organisms seen  Culture and Gram Stain, Aerobic, Body FluidFINAL       05/15/19 18:18   +  05/15/19   Very light growth of Escherichia coli      _____________________________________________________________________________                                     E.coli       ANTIBIOTICS                     MIC  INTRP      _____________________________________________________________________________  Amoxicillin/CA                  8/4    S        Ampicillin                      >16    R        Ampicillin/sulbactam           16/8    I        Aztreonam                       <=2    S        Cefazolin                        2     S        Cefepime                        <=1    S        Cefoxitin                       <=4    S        Ceftazidime                     <=2    S        Ceftriaxone                     <=1    S        Cefuroxime                      <=4    S        Ciprofloxacin                 <=  0.25   S        Ertapenem                     <=0.25   S        Gentamicin                      <=2    S        Levofloxacin                   <=0.5   S        Meropenem                      <=0.5   S        Piperacillin/Tazobactam        <=2/4   S        Tetracycline                    >8     R        Trimethoprim/Sulfamethoxazole  >2/38   R        _____________________________________________________________________________            S=SUSCEPTIBLE     I=INTERMEDIATE     R=RESISTANT                            N/S=NON-SUSCEPTIBLE  _____________________________________________________________________________      Urine culture [[id number]] Collected: 05/12/19 1611    Specimen: Bladder Urine Updated: 05/14/19 0909    Narrative:      Replace urinary catheter prior to obtaining the urine culture  if it has been in place for greater than or equal to 14  days:->N/A No Foley  Indications for U/A Reflex to Micro - Reflex to  Culture:->Suprapubic Pain/Tenderness or  Dysuria  ORDER#: [id number]                                    ORDERED BY: HAILE, LYDIA  SOURCE: Urine                                        COLLECTED:  05/12/19 16:11  ANTIBIOTICS AT COLL.:                                RECEIVED :  05/12/19 16:26  Culture Urine                              FINAL       05/14/19 09:09  05/14/19   >100,000 CFU/ML of multiple bacterial morphotypes present.             Possible contamination, appropriate recollection is             requested if clinically indicated.             Patient Instructions   Discharge Diet: renal diet   Discharge Activity: as tolerated     Follow Up Appointment:  Follow-up Information     Ronie Spies, MD Follow up.    Specialties: Gastroenterology, Internal Medicine  Contact information:  [address]  [place] [address]  [phone]                    Time spent examining patient, discussing with patient/family regarding hospital course, chart review, reconciling medications and discharge planning: 45 minutes.    Milton Ferguson    10:48 AM 05/23/2019

## 2019-05-23 NOTE — Progress Notes (Signed)
cycler started at [facility]. Effluent clear. Dressing to abdomen dry and intact. S/p  New right permcath in [facility]vir. Pt. To start hemodialysis tomorrow. Pt. C/o soreness at permcath site. Report given to Santiago Bumpers RN     05/23/19 1952   Initiating APD (Putting the Patient ON)    $APD Start Time 1930   Total Treatment Time Ordered (hours) 10 hours   Number of Cycles/Exchanges Ordered 5   Total Fill Volume (mL) 12500 mL   Fluid Orders for APD/CCPD   Cycler Bag #1 4.25%, Low Calcium in 5000 mL   Cycler Bag #2 2.5 %, Low Calcium in 5000 mL   Cycler Bag #3 1.5%, Low Calcium in 5000 mL   Last Fill Dianeal Solution Dextrose 1.5%   Last Fill Volume -if intermittent (mL) 2500 mL

## 2019-05-23 NOTE — Brief Op Note (Signed)
[facility]  CV[facility]   Brief Op Note     Physician(s): Alexandrea Westergard C Aiana Nordquist, MD    Pre-operative Diagnosis: ESRD    Post-operative Diagnosis: Diagnosis is same as preop diagnosis    Procedure(s) Performed: Tunneled dialysis catheter placement                                                                                                                                                                                                                 Anesthesia:  Moderate Sedation and Local with 1% Lidocaine    Complications: None    Estimated Blood Loss:  Minimal    Blood Aministered:  None    Fluid Aministered:  Per Nursing    Tubes and Drains: 23 cm Equistream right IJ    Specimens: None    Findings: Phlebitic right internal jugular vein from previous catheter, low puncture at the IJ brachiocephalic vein junction. Tunneled dialysis catheter placed and ready for use.    Patient was transferred from the procedure room to the nursing unit in stable condition.  Procedure note dictated.    Signed by: Jacques Earthly, MD  [place] [facility]  [place] [phone]  [place] [phone]

## 2019-05-23 NOTE — PT Progress Note (Signed)
[facility]  Physical Therapy Attempt Note    Patient:  CLORIS FLIPPO MRN#:  [id number]  Unit:  [place] [facility] Room/Bed:  AXIVR/AXIVR      Physical Therapy treatment attempted on 05/23/2019 at 1:20 PM unable to complete secondary to pt off unit to CVIR.  Will continue as able.    Aram Beecham   05/19/8339 1:22 PM  Physical Therapist Assistant

## 2019-05-23 NOTE — Plan of Care (Addendum)
Discharge Recommendation: Home with supervision, Home with home health OT, Home with home health PT, Other(Comment)(If supervision unavailable, recommend SNF)   DME Recommended for Discharge: Front wheel walker, Tub transfer bench, Reacher, Long-handled sponge, Long-handled shoehorn, Other (Comment)(Bariatric sock aid)    OT Frequency Recommended: 1-2x/wk     Is PT evaluation indicated at this time? This patient is already on PT caseload.     PMP Activity: Step 6 - Walks in Room  Distance Walked (ft) (Step 6,7): 15 Feet  (Please See Therapy Evaluation for device and assistance level needed)    Treatment Interventions: ADL retraining, Functional transfer training, Patient/Family training, Equipment eval/education, Compensatory technique education, Continued evaluation       Goal Formulation: Patient  Time For Goal Achievement: 5 visits  ADL Goals  Patient will groom self: Supervision, at sinkside, Goal met  Patient will dress lower body: Stand by Assist, with AE, Goal met, New goal, Modified Independent(SBA)  Patient will toilet: Supervision, Partly met(SBA)  Mobility and Transfer Goals  Pt will perform functional transfers: Supervision, with rolling walker, Partly met(SBA)

## 2019-05-23 NOTE — OT Progress Note (Signed)
Occupational Therapy Note    [facility]  Occupational Therapy Treatment     Patient: SABENA WINNER     MRN#: [id number]   Unit: [facility] [address] [facility]  Bed: A2315/A2315.01    Time of treatment:  Time Calculation  OT Received On: 05/23/19  Start Time: 1203  Stop Time: 1227  Time Calculation (min): 24 min             Chart Review and Collaboration with Care Team: 4 minutes, not included in above time.    OT Visit Number: 3    Precautions and Contraindications:    Precautions  Other Precautions: falls    Personal Protective Equipment (PPE)  gloves, procedure mask, eye shield/covering and shoe covers    Updated Labs:  Lab Results   Component Value Date/Time    HGB 9.8 (L) 05/23/2019 04:51 AM    HCT 32.8 (L) 05/23/2019 04:51 AM    K 3.7 05/23/2019 04:51 AM    NA 140 05/23/2019 04:51 AM    INR 0.9 03/18/2006 03:00 PM    TROPI 0.04 05/17/2019 06:20 PM    TROPI 0.05 05/17/2019 01:37 PM    TROPI 0.01 03/18/2006 06:10 PM       All imaging reviewed, please see chart for details.    Subjective:    .  "My feet are so swollen, it's really hard for me to reach to my feet."       Patient's medical condition is appropriate for Occupational Therapy intervention at this time.  Patient is agreeable to participation in the therapy session. Nursing clears patient for therapy.  Pain Assessment  Pain Assessment: Numeric Scale (0-10)  Pain Score: 7-severe pain  Pain Location: Abdomen  Pain Orientation: Right  Effect of Pain on Daily Activities: mild  Pain Intervention(s): Distraction;Emotional support;Ambulation/increased activity      Objective:  Observation of Patient/Vital Signs:    Stable    Cognition/Neuro Status  Memory: Appears intact  Following Commands: Follows all commands and directions without difficulty  Safety Awareness: minimal verbal instruction  Problem Solving: Assistance required to identify errors made  Behavior: attentive;calm;cooperative    Functional Mobility  Functional  Mobility/Ambulation: Stand by Assist(With RW)    Self-care and Home Management  Bathing: Minimal Assist;right lower leg including foot;left lower leg including foot(Recommending long handled sponge)  UB Dressing: (Assist to tie hosp gown behind head, IND otherwise)  LB Dressing: Stand by Assist;Don/doff R sock;Don/doff L sock;Use of adaptive equipment  Toileting: Stand by Assist  Functional Transfers: Stand by Assist(With RW)      Educated the patient to role of occupational therapy, plan of care, goals of therapy and safety with mobility and ADLs with verbalized understanding.     Patient left in bedside chair with all medical equipment in place and call bell and all personal items/needs within reach (of note, pt received without alarm in place).  RN notified of session outcome.      Assessment:  Pt demonstrates excellent progression towards OT goals this session, able to perform all ADLs with stand by assist.  Continues to demonstrate decreased standing balance as well as difficulty reaching to b/l feet, limiting independence in LB ADLs.  Pt trained in use of long handled AE with good carryover demonstrated from pt on technique to use - would benefit from f/u in use of long handled AE in future OT session.  Pt would benefit from continued OT services to improve safety and independence in bathing, dressing,  toileting, and grooming tasks.  Modification of d/c recommendation from SNF to home with supervision and HHOT/PT.    OT frequency is being decreased to 1-2x/week due to change in discharge destination requiring corresponding frequency update.      PT/OT discharge recommendations differ 2/2 improvement in pt's independence with ADL and mobility tasks since pt last seen by PT. Other discipline aware.      PMP Activity: Step 6 - Walks in Room  Distance Walked (ft) (Step 6,7): 15 Feet      Plan:  Goal Formulation: Patient      Time For Goal Achievement: 5 visits  ADL Goals  Patient will groom self: Supervision, at  sinkside, Goal met  Patient will dress lower body: Stand by Assist, with AE, Goal met, New goal, Modified Independent(SBA)  Patient will toilet: Supervision, Partly met(SBA)  Mobility and Transfer Goals  Pt will perform functional transfers: Supervision, with rolling walker, Partly met(SBA)                       Continue plan of care.       DME Recommended for Discharge: Front wheel walker, Tub transfer bench, Reacher, Long-handled sponge, Long-handled shoehorn, Other (Comment)(Bariatric sock aid)  Discharge Recommendation: Home with supervision, Home with home health OT, Home with home health PT, Other(Comment)(If supervision unavailable, recommend SNF)      Nicky Pugh, OTR/L  [id number]    05/23/2019 12:45 PM

## 2019-05-23 NOTE — Progress Notes (Signed)
Patient is alert and oriented x4. Patient denies chest pain and shortness of breath.  Patient is ambulatory to the bathroom with asistance. 1 BM noted during the shift. Patient's safety was maintained during the shift. Patient continues  on tele. Patient vital signs and lab work were monitored. Doctor saw pt and explained plan of care with patient at bed side. Patient was educated on all her medications and precautions to be taken.Pt taken to CVIR on stable condition. Will continue plan of care.

## 2019-05-23 NOTE — Progress Notes (Signed)
Delay: unable to find PD at any SNF in [place].                  Dede Query, RN MSN       Clinical Case Manager       [facility]        [address] [place], [place] [address]      [email or web address]      [phone]

## 2019-05-23 NOTE — UM Notes (Signed)
QPYP#[id number]  05/22/19-05/23/19 Concurrent Clinical Inpatient Review      05/23/19 Case Manager Note   Pt will be switched from PD to HD.  Plan for tunneled catheter. Will need permacath prior d/c.      Pt will d/c to [facility]. PT/OT to see pt for acceptance to SNF. Pt agreeable to participate in therapy.         05/23/19 Medicine Note  Hospital Course  Presentation History   Per Dr Ann Maki "[name] a 64 y.o.femalewith history ofhypertension, hyperlipidemia, insulin-dependent diabetes mellitus, COPD, end-stage renal disease on peritoneal dialysiswho presents to the emergency department for evaluation of a 3-day history of intractable nausea,vomiting and nonbloody diarrhea. She states that she ate an ultimate omelette at [facility] on 4/16/21and following that her symptoms started.She complains of constant diffuseabdominal pain described as achingpain, reaching10 out of 10 in severity which is exacerbated by movement. She also reports a fever 102 last night along with chills,feeling dizzy and near syncope, generalized weakness. She reports no history of renal stones. She does report having a UTI in the past.  She states that she had noted blood in one of her episodes of emesis. She states that she was hospitalized 1 month ago at [facility] where she had an upper endoscopy but she cannotrecall the results.  States that she was started on hemodialysis last May 2020 and after that she was transitioned to peritoneal dialysis at home. She states that she intermittently makes urine and last week she had frequent urination.  She reports noheadache,visual changes,syncope, loss of consciousness, hematochezia, melena, dysuria, hematuria, numbness/tingling, focal weakness.   In the ED, patient was hypertensive on presentation, afebrile, not tachycardic or hypoxic. Labs significant for glucose of 551, creatinine 4.7. She received 3 L normal saline bolus and vancomycin and Zosyn in the  ED    See HPI for details.    Hospital Course (11 Days)   [name] a 64 y.o.femalewith history ofhypertension, hyperlipidemia, insulin-dependent diabetes mellitus, COPD, end-stage renal disease on peritoneal dialysiswho presents to the emergency department for evaluation of a 3-day history of intractable nausea,vomiting and nonbloody diarrhea, admitted for viral gastroenteritis and UTI.    #UGIB - Patient had 1 episode of 400 cc coffee ground emesis on 4/20 per RN  #Possible melena per patient report  -s/p EGD on 4/21: Grade D gastritis  -Continue Protonix 40 mg BID  -Ok to restart ASA and pletal per GI  -H/H stable   -GI recommendations appreciated. Outpatient follow-up for repeat EGD.    Chest pain  -EKG NSR, troponin x2 negative.  -D dimer elevated, CTA chest negative for PE.     #GNR Peritonitis, pansensitive   #Abdominal pain  #Nausea, vomiting, diarrhea - Resolved  #UTI  -Likelydue toviral gastroenteritis/UTI/Peritonitis  -Peritoneal fluid with 51,000 WBC's.Improved to 200s  -Blood cultures NGTD, lactic acid normal and lipase normal  -Urine culture: >100,000 CFU/ML of multiple bacterial morphotypes   -CT abd/pelvis wo contrastrevealsno evidence of bowel obstruction, bowel ischemia, perforation, diverticulitis, pancreatitis, nephrolithiasis and shows intraperitoneal fluid  -PRN antiemetics for nausea/vomiting  -Continue IPcefazolinwith PD exchanges. Nephrology will be placing orders for this.   -ID recommendations appreciated    #Hypokalemia  #Hypomagnesemia  -Resolved    #ESRD on peritoneal dialysis  -Started on dialysis on 05/2018  -CT abd/pelvis shows moderate amount of ascites ? Dialysis fluid  -Under care everywhere, noted that Cr from 10/2018-11/2018 has been 3.8-5 range  -Continue PD with IP antibiotics  -Pt  now wanting HD over PD. CVIR consult for tunneled catheter placement.   -Repeat cell count per nephrology   -Nephrology following    #Hypertension  -Continue home  amlodipine, coreg    #Hyperglycemia  #Type 2 DM  -Continue home lantus, SSI, ac/hs accuchecks  -HbA1c 9.8%    #Hyperlipidemia   -Continue home statin    #Iron deficiency anemia  #Anemia of Chronic disease   -Completed IV Venofer 3/3  -S/p 1 unit pRBC transfusion     Industry instructions given     Return instructions given     No changes in clinical course over the last 24 hours. Delay in [place] due to pt awaiting placement.     Procedures/Imaging:   CT Angiogram Chest    Result Date: 05/18/2019     No evidence of pulmonary embolism. Small bilateral pleural effusions. Large hiatal hernia. Trace perihepatic ascites. Other nonurgent findings as above. Lonia Skinner, MD  05/18/2019 9:58 AM    Temp:  [97.7 F (36.5 C)-99.1 F (37.3 C)]   Heart Rate:  [62-68]   Resp Rate:  [16-19]   BP: (122-178)/(71-86)   SpO2:  [98 %-99 %]       05/23/19 CVIR Note:  64 y/o female with ESRD here for PD associated peritonitis- E.coli. Patient previously on PD, would now like to be on HD. CVIR requested by Nephrology for a tunneled hemodialysis catheter placement today. Patient hemodynamically stable. Currently afebrile. Patient denies CP, SOB, N/V/D, fever or chills.     PLAN:       TDC placement in CVIR today.     Continue NPO status.    Continue medical management per primary medicine team    05/23/19 Nephro Assessment:    1. PD associated peritonitis - E Coli              - cell count improving              - no need to remove catheter  2. ESRD- on CAPD  3. Anemia- CKD + Fe deficiency now exacerbated by GI bleed  4. Volume overload: improving slowly with PD  5. Hypokalemia  6. Hypomagnesemia - resolved    Recommendations:  - continue current PD order w/ Abx  - will consult CVIR for tunneled line; they will get back to if it can be given today  - will keep PD catheter in place  - will request CM to settup outpatient HD at [place]      05/22/2019 04:40  Hemoglobin: 9.9   Hematocrit: 33.1     05/23/2019 04:51  Hemoglobin: 9.8    Hematocrit: 32.8     05/22/2019 04:40  Glucose: 342   BUN: 25.0   Creatinine: 4.6       05/22/2019 04:40  Albumin: 1.7 (L)  Protein Total: 4.9     05/23/2019 04:51  Glucose: 316   BUN: 24.0   Creatinine: 4.3     05/23/2019 04:51  Calcium: 7.9     05/23/2019 04:51  Albumin: 1.7   Protein Total: 4.7       05/22/19 Nephro Assessment:    1. PD associated peritonitis - E Coli              - cell count improving              - no need to remove catheter  2. ESRD- on CAPD  3. Anemia- CKD + Fe deficiency now exacerbated by GI bleed  4.  Volume overload: improving slowly with PD  5. Hypokalemia  6. Hypomagnesemia - resolved    Recommendations:  Continue current PD orders    Scheduled Meds:  Current Facility-Administered Medications   Medication Dose Route Frequency    amLODIPine  5 mg Oral Q12H    atorvastatin  40 mg Oral Daily    carvedilol  25 mg Oral Q12H SCH    darbepoetin alfa  60 mcg Subcutaneous Weekly    peritoneal dialysis with additives   Peritoneal catheter Q24H    famotidine  10 mg Oral Q12H [place]    insulin glargine  15 Units Subcutaneous QHS    insulin lispro  1-4 Units Subcutaneous QHS    insulin lispro  1-8 Units Subcutaneous TID AC    insulin lispro  3 Units Subcutaneous TID MEALS    lactulose  30 g Oral Q8H SCH    pantoprazole  40 mg Oral BID    sevelamer  800 mg Oral TID MEALS     Continuous Infusions:   sodium chloride 100 mL/hr at 05/14/19 1828    dianeal low calcium (2.5 mEq/L) 4.25% dextrose      dianeal PD-2 (calcium 3.5 mEq/L) 2.5% dextrose      lactated ringers Stopped (05/15/19 1409)       hydrALAZINE (APRESOLINE) tablet 10 mg   Dose: 10 mg  Freq: Once Route: PO   05/23/19 0215

## 2019-05-23 NOTE — Sedation Documentation (Addendum)
[name],[name] to  C Lab     For Tunneled line placement  Dr. Stevie Kern    Pre-Procedure pause complete, all in agreement.  Patient transferred to table in supine  position.    14.5 fr 23 cm tunneled line placed to r chest.  dermabond and suture to r neck.  Line sutured in place.  Dressing applied, CDI.      Patient tolerated procedure well, VSS.  150 Mcg Fentanyl  3 Mg Versed given.             Patient transferred to 2315 Report given to RN.

## 2019-05-23 NOTE — Progress Notes (Signed)
Pt disconnected from the PD machine without any  Complications, drain fluid clear and yellow. Patient denied any complaints.     05/23/19 0830   Completing APD/CCPD (Taking the Patient OFF)    Effluent Appearance Clear;Yellow   Specimen Collected No   Last Fill Volume (mL) 2500 mL   Volume Collected in Drainage Bag (mL) 1126 mL  (Total UF)   Peritoneal Dialysis Comments Disconnected without any problem

## 2019-05-23 NOTE — Progress Notes (Signed)
Delay: unable to find PD at any SNF in [place].                     Dede Query, RN MSN       Clinical Case Manager       [facility]        [address], [place], [place] [address]      [email or web address]      [phone]

## 2019-05-23 NOTE — Interval H&P Note (Signed)
[facility]  CVIR   History and Physical     History and Physical:     The patient was interviewed and examined in the preoperative holding area. The previously documented history and physical were reviewed in detail and changes are none. Patient previously was on peritoneal dialysis, now converting to hemodialysis requiring the placement of a tunneled HD catheter.     ASA Classification:   []    ASA 1  Healthy patient  []    ASA 2  Mild systemic illness  [x]    ASA 3  Systemic disease, though not incapacitating  []    ASA 4  Severe systemic disease that is a constant threat to life   []    ASA 5  Moribund condition, patient unexpected to live >24 hours, irrespective of procedure  []    E         Emergent procedure    Mallampati Score:   []  1  []  2  [x]  3  []  4  []  Intubated  []  Tracheostomy    Planned Anesthesia:   []  No sedation  [x]  Local  [x]  Moderate sedation  []  Deep sedation (with Anesthesiology present)  []  General Anesthesia     Airway Assesment:   [x]  Normal  [] Compromised    Diagnosis: ESRD  Procedure: Tunneled hemodialysis catheter    The patient is medically stable and appropriate to undergo the planned procedure. All risks and alternatives were discussed with the patient and she agrees to proceed.     Signed by: Denzil Magnuson, NP  [facility]  [phone]

## 2019-05-23 NOTE — Plan of Care (Signed)
Problem: Moderate/High Fall Risk Score >5  Goal: Patient will remain free of falls  Outcome: Progressing  Flowsheets  Taken 05/16/2019 1905 by Gaynelle Cage, RN  Moderate Risk (6-13):   MOD-Consider activation of bed alarm if appropriate   MOD-Perform dangle, stand, walk (DSW) when getting patient up   MOD-Use gait belt when appropriate  Taken 05/14/2019 0831 by Julien Girt, RN  VH High Risk (Greater than 13):   Keep door open for better visibility   Use assistive devices   Request PT/OT therapy consult order from physician for patients with gait/mobility impairment   Use of floor mat   Use of roll guard     Problem: Safety  Goal: Patient will be free from injury during hospitalization  Outcome: Progressing  Flowsheets (Taken 05/21/2019 1404 by Orbie Hurst, RN)  Patient will be free from injury during hospitalization:   Assess patient's risk for falls and implement fall prevention plan of care per policy   Provide and maintain safe environment   Use appropriate transfer methods  Goal: Patient will be free from infection during hospitalization  Outcome: Progressing  Flowsheets (Taken 05/17/2019 1837 by Kearney Hard, RN)  Free from Infection during hospitalization:   Assess and monitor for signs and symptoms of infection   Monitor all insertion sites (i.e. indwelling lines, tubes, urinary catheters, and drains)   Monitor lab/diagnostic results   Encourage patient and family to use good hand hygiene technique     Problem: Pain  Goal: Pain at adequate level as identified by patient  Outcome: Progressing  Flowsheets (Taken 05/17/2019 1837 by Kearney Hard, RN)  Pain at adequate level as identified by patient:   Identify patient comfort function goal   Assess for risk of opioid induced respiratory depression, including snoring/sleep apnea. Alert healthcare team of risk factors identified.   Assess pain on admission, during daily assessment and/or before any "as needed" intervention(s)    Reassess pain within 30-60 minutes of any procedure/intervention, per Pain Assessment, Intervention, Reassessment (AIR) Cycle     Problem: Compromised Tissue integrity  Goal: Damaged tissue is healing and protected  Outcome: Progressing  Flowsheets (Taken 05/21/2019 1404 by Orbie Hurst, RN)  Damaged tissue is healing and protected:   Monitor/assess Braden scale every shift   Increase activity as tolerated/progressive mobility   Relieve pressure to bony prominences for patients at moderate and high risk    Problem: Renal Instability  Goal: Fluid and electrolyte balance are achieved/maintained  Outcome: Progressing  Flowsheets (Taken 05/21/2019 1404 by Orbie Hurst, RN)  Fluid and electrolyte balance are achieved/maintained:   Monitor/assess lab values and report abnormal values   Assess for confusion/personality changes   Assess and reassess fluid and electrolyte status  Goal: Nutritional intake is adequate  Outcome: Progressing  Flowsheets (Taken 05/21/2019 1404 by Orbie Hurst, RN)  Nutritional intake is adequate: Allow adequate time for meals  Goal: Perineal skin integrity is maintained or improved  Outcome: Progressing  Flowsheets (Taken 05/15/2019 2050 by Jory Sims, RN)  Perineal skin integrity is maintained or improved: Keep intact skin clean and dry  Goal: Free from infection  Outcome: Progressing  Flowsheets (Taken 05/20/2019 0829 by Lendell Caprice, RN)  Free from infection: Monitor/assess for signs and symptoms of infection     Problem: Patient Receiving Advanced Renal Therapies  Goal: Therapy access site remains intact  Outcome: Progressing  Flowsheets (Taken 05/20/2019 0829 by Lendell Caprice, RN)  Therapy access site remains intact: Assess therapy access site  Problem: Altered GI Function  Goal: Nutritional intake is adequate  Outcome: Progressing  Flowsheets (Taken 05/21/2019 1404 by Orbie Hurst, RN)  Nutritional intake is adequate: Allow adequate time for meals  Goal: Fluid and electrolyte  balance are achieved/maintained  Outcome: Progressing  Flowsheets (Taken 05/17/2019 1837 by Kearney Hard, RN)  Fluid and electrolyte balance are achieved/maintained:   Monitor intake and output every shift   Monitor/assess lab values and report abnormal values   Provide adequate hydration   Assess for confusion/personality changes   Monitor daily weight   Assess and reassess fluid and electrolyte status  Goal: Elimination patterns are normal or improving  Outcome: Progressing  Flowsheets (Taken 05/20/2019 1749 by Kearney Hard, RN)  Elimination patterns are normal or improving:   Report abnormal assessment to physician   Anticipate/assist with toileting needs   Assess for normal bowel sounds   Monitor for abdominal distension  Goal: Mobility/Activity is maintained at optimal level for patient  Outcome: Progressing  Flowsheets (Taken 05/19/2019 1909 by Kearney Hard, RN)  Mobility/activity is maintained at optimal level for patient:   Increase mobility as tolerated/progressive mobility   Encourage independent activity per ability   Perform active/passive ROM   Maintain proper body alignment   Plan activities to conserve energy, plan rest periods   Reposition patient every 2 hours and as needed unless able to reposition self  Goal: No bleeding  Outcome: Progressing  Flowsheets (Taken 05/15/2019 2050 by Jory Sims, RN)  No bleeding: Monitor and assess vitals and hemodynamic parameters     Problem: Compromised Hemodynamic Status  Goal: Vital signs and fluid balance maintained/improved  Outcome: Progressing  Flowsheets (Taken 05/22/2019 0315)  Vital signs and fluid balance are maintained/improved: Monitor/assess vitals and hemodynamic parameters with position changes     Problem: Inadequate Gas Exchange  Goal: Adequate oxygenation and improved ventilation  Outcome: Progressing  Flowsheets (Taken 05/22/2019 0315)  Adequate oxygenation and improved ventilation:   Assess lung sounds   Position for maximum  ventilatory efficiency  Goal: Patent Airway maintained  Outcome: Progressing  Flowsheets (Taken 05/22/2019 0315)  Patent airway maintained:   Provide adequate fluid intake to liquefy secretions   Position patient for maximum ventilatory efficiency     Problem: Inadequate Airway Clearance  Goal: Normal respiratory rate/effort achieved/maintained  Outcome: Progressing  Flowsheets (Taken 05/22/2019 0315)  Normal respiratory rate/effort achieved/maintained: Plan activities to conserve energy: plan rest periods    Problem: Impaired Mobility  Goal: Mobility/Activity is maintained at optimal level for patient  Outcome: Progressing  Flowsheets (Taken 05/19/2019 1909 by Kearney Hard, RN)  Mobility/activity is maintained at optimal level for patient:   Increase mobility as tolerated/progressive mobility   Encourage independent activity per ability   Perform active/passive ROM   Maintain proper body alignment   Plan activities to conserve energy, plan rest periods   Reposition patient every 2 hours and as needed unless able to reposition self

## 2019-05-23 NOTE — Plan of Care (Signed)
Problem: Renal Instability  Goal: Fluid and electrolyte balance are achieved/maintained  Flowsheets (Taken 05/22/2019 1813 by Orbie Hurst, RN)  Fluid and electrolyte balance are achieved/maintained:   Provide adequate hydration   Assess and reassess fluid and electrolyte status   Assess for confusion/personality changes     Problem: Renal Instability  Goal: Free from infection  Flowsheets (Taken 05/22/2019 1726 by Mickel Baas, RN)  Free from infection: Monitor/assess for signs and symptoms of infection     Problem: Patient Receiving Advanced Renal Therapies  Goal: Therapy access site remains intact  Flowsheets (Taken 05/22/2019 1726 by Mickel Baas, RN)  Therapy access site remains intact:   Assess therapy access site   Change therapy access site dressing as needed

## 2019-05-23 NOTE — Progress Notes (Signed)
[facility] PROGRESS NOTE  [place] Ed[place], x [phone] [facility] Spectralink)      Date Time: 05/23/19 10:48 AM  Patient Name: [name]  Attending Physician: Ardeth Sportsman, MD    CC: follow-up ESRD  PD note    Assessment:     1. PD associated peritonitis - E Coli   - cell count improving   - no need to remove catheter  2. ESRD- on CAPD  3. Anemia- CKD + Fe deficiency now exacerbated by GI bleed  4. Volume overload: improving slowly with PD  5. Hypokalemia  6. Hypomagnesemia - resolved        Recommendations:     - continue current PD order w/ Abx  - will consult CVIR for tunneled line; they will get back to if it can be given today  - will keep PD catheter in place  - will request CM to settup outpatient HD at [place];    Case discussed with:  Pt, HD-RN, Dr. Aline August (outpatient nephrologist), and Dr. Janyth Pupa, [facility]  [phone] (office)  X [phone] [facility] Spectra-Link)      Subjective: Resting comfortably. Still having some abdominal pain. Had a BM      Review of Systems:   Cardio: no chest pain, no edema  Pulm: no SOB  Abd: no tenderness to palpation      Physical Exam:     Vitals:    05/23/19 0040 05/23/19 0340 05/23/19 0707 05/23/19 0900   BP: 178/71 152/74 155/86 155/86   Pulse: 64  62 62   Resp: 16 16 19     Temp: 99 F (37.2 C) 99.1 F (37.3 C) 98.1 F (36.7 C)    TempSrc: Oral Oral Oral    SpO2: 99% 99% 99%    Weight:  112 kg (247 lb)     Height:           Intake and Output Summary (Last 24 hours) at Date Time    Intake/Output Summary (Last 24 hours) at 05/23/2019 1048  Last data filed at 05/23/2019 0830  Gross per 24 hour   Intake 820 ml   Output -1274 ml   Net 2094 ml       General: obese female, somnolent  Cardiovascular: regular rate and rhythm  Lungs: clear to auscultation bilaterally, bilateral air entry,normal work of breathing  Abdomen: soft, NT,ND;LLQ PD catheter  Extremities: improved edema      Meds:      Scheduled Meds: PRN Meds:     amLODIPine, 5 mg, Oral, Q12H  atorvastatin, 40 mg, Oral, Daily  carvedilol, 25 mg, Oral, Q12H SCH  darbepoetin alfa, 60 mcg, Subcutaneous, Weekly  peritoneal dialysis with additives, , Peritoneal catheter, Q24H  famotidine, 10 mg, Oral, Q12H SCH  insulin glargine, 15 Units, Subcutaneous, QHS  insulin lispro, 1-4 Units, Subcutaneous, QHS  insulin lispro, 1-8 Units, Subcutaneous, TID AC  insulin lispro, 3 Units, Subcutaneous, TID MEALS  lactulose, 30 g, Oral, Q8H SCH  pantoprazole, 40 mg, Oral, BID  sevelamer, 800 mg, Oral, TID MEALS          Continuous Infusions:   sodium chloride 100 mL/hr at 05/14/19 1828    dianeal low calcium (2.5 mEq/L) 4.25% dextrose      dianeal PD-2 (calcium 3.5 mEq/L) 2.5% dextrose      lactated ringers Stopped (05/15/19 1409)    sodium chloride, , PRN  bisacodyl, 10 mg, Daily PRN  dextrose,  15 g of glucose, PRN   And  dextrose, 12.5 g, PRN   And  glucagon (rDNA), 1 mg, PRN  naloxone, 0.2 mg, PRN  ondansetron, 4 mg, Q6H PRN   Or  ondansetron, 4 mg, Q6H PRN              Labs:     Recent Labs   Lab 05/23/19  0451 05/22/19  0440 05/21/19  0425   WBC 8.64 7.78 7.54   Hgb 9.8* 9.9* 9.3*   Hematocrit 32.8* 33.1* 31.4*   Platelets 225 225 199     Recent Labs   Lab 05/23/19  0451 05/22/19  0440 05/21/19  0425 05/20/19  0323 05/18/19  0844 05/17/19  1337 05/17/19  0939 05/17/19  0939   Sodium 140 139 140   140  More results in Results Review 140  --   More results in Results Review 141   Potassium 3.7 4.4 4.5   4.6  More results in Results Review 4.2  --   More results in Results Review 3.0*   Chloride 102 103 102   102  More results in Results Review 102  --   More results in Results Review 103   CO2 28 27 29   29   More results in Results Review 32*  --   More results in Results Review 28   BUN 24.0* 25.0* 28.0*   27.0*  More results in Results Review 25.0*  --   More results in Results Review 24.0*   Creatinine 4.3* 4.6* 4.6*   4.6*  More results in Results Review 4.4*  --   More results in  Results Review 4.3*   Calcium 7.9* 7.8* 7.9*   8.1*  More results in Results Review 7.8*  --   More results in Results Review 7.4*   Albumin 1.7* 1.7* 1.6*  More results in Results Review 1.4*  --   More results in Results Review 1.4*   Phosphorus  --   --   --   --  3.6  --   --  3.8   Magnesium  --   --   --   --   --  1.8  --  1.7   Glucose 316* 342* 414*   415*  More results in Results Review 255*  --   More results in Results Review 164*   EGFR 12.5 11.6 11.6   11.6  More results in Results Review 12.2  --   More results in Results Review 12.5   More results in Results Review = values in this interval not displayed.           Signed by: Renard Hamper, MD

## 2019-05-23 NOTE — Progress Notes (Signed)
Pt will be switched from PD to HD.  Plan for tunneled catheter. Will need permacath prior d/c.      Pt will d/c to [facility]. PT/OT to see pt for acceptance to SNF. Pt agreeable to participate in therapy.      Pt will need a [facility].                 Dede Query, RN MSN       Clinical Case Manager       [facility]        [address] [place], [place] [address]      [email or web address]      [phone]

## 2019-05-24 DIAGNOSIS — Z992 Dependence on renal dialysis: Secondary | ICD-10-CM

## 2019-05-24 DIAGNOSIS — K659 Peritonitis, unspecified: Secondary | ICD-10-CM

## 2019-05-24 DIAGNOSIS — K922 Gastrointestinal hemorrhage, unspecified: Secondary | ICD-10-CM

## 2019-05-24 LAB — GLUCOSE WHOLE BLOOD - POCT
Whole Blood Glucose POCT: 203 mg/dL — ABNORMAL HIGH (ref 70–100)
Whole Blood Glucose POCT: 132 mg/dL — ABNORMAL HIGH (ref 70–100)
Whole Blood Glucose POCT: 142 mg/dL — ABNORMAL HIGH (ref 70–100)

## 2019-05-24 LAB — COMPREHENSIVE METABOLIC PANEL
ALT: <6 U/L (ref 0–55)
AST (SGOT): 11 U/L (ref 5–34)
Albumin/Globulin Ratio: 0.6 — ABNORMAL LOW (ref 0.9–2.2)
Albumin: 1.8 g/dL — ABNORMAL LOW (ref 3.5–5.0)
Alkaline Phosphatase: 95 U/L (ref 37–106)
Anion Gap: 9 (ref 5.0–15.0)
BUN: 26 mg/dL — ABNORMAL HIGH (ref 7.0–19.0)
Bilirubin, Total: 0.2 mg/dL (ref 0.2–1.2)
CO2: 31 mEq/L — ABNORMAL HIGH (ref 22–29)
Calcium: 8 mg/dL — ABNORMAL LOW (ref 8.5–10.5)
Chloride: 101 mEq/L (ref 100–111)
Creatinine: 5 mg/dL — ABNORMAL HIGH (ref 0.6–1.0)
Globulin: 3 g/dL (ref 2.0–3.6)
Glucose: 253 mg/dL — ABNORMAL HIGH (ref 70–100)
Potassium: 3.7 mEq/L (ref 3.5–5.1)
Protein, Total: 4.8 g/dL — ABNORMAL LOW (ref 6.0–8.3)
Sodium: 141 mEq/L (ref 136–145)

## 2019-05-24 LAB — CBC
Absolute NRBC: 0.03 10*3/uL — ABNORMAL HIGH (ref 0.00–0.00)
Hematocrit: 32.2 % — ABNORMAL LOW (ref 34.7–43.7)
Hgb: 9.9 g/dL — ABNORMAL LOW (ref 11.4–14.8)
MCH: 29.8 pg (ref 25.1–33.5)
MCHC: 30.7 g/dL — ABNORMAL LOW (ref 31.5–35.8)
MCV: 97 fL — ABNORMAL HIGH (ref 78.0–96.0)
MPV: 12.1 fL (ref 8.9–12.5)
Nucleated RBC: 0.3 /100 WBC — ABNORMAL HIGH (ref 0.0–0.0)
Platelets: 212 10*3/uL (ref 142–346)
RBC: 3.32 10*6/uL — ABNORMAL LOW (ref 3.90–5.10)
RDW: 13 % (ref 11–15)
WBC: 8.93 10*3/uL (ref 3.10–9.50)

## 2019-05-24 LAB — HEMOLYSIS INDEX: Hemolysis Index: 3 (ref 0–18)

## 2019-05-24 LAB — GFR: EGFR: 10.5

## 2019-05-24 MED ORDER — ALTEPLASE 2 MG IJ SOLR
2.0000 mg | INTRAMUSCULAR | Status: AC | PRN
Start: 2019-05-24 — End: 2019-05-24
  Administered 2019-05-24 (×2): 2 mg
  Filled 2019-05-24: qty 2

## 2019-05-24 MED ORDER — INSULIN GLARGINE 100 UNIT/ML SC SOLN
10.00 [IU] | Freq: Two times a day (BID) | SUBCUTANEOUS | Status: DC
Start: 2019-05-24 — End: 2019-05-26
  Administered 2019-05-24 – 2019-05-26 (×3): 10 [IU] via SUBCUTANEOUS
  Filled 2019-05-24 (×4): qty 10

## 2019-05-24 MED ORDER — DEXTROSE 5 % IV SOLN
2.00 g | Freq: Once | INTRAVENOUS | Status: AC
Start: 2019-05-24 — End: 2019-05-24

## 2019-05-24 MED ORDER — CEFAZOLIN SODIUM 1 G IJ SOLR
2.00 g | Freq: Once | INTRAMUSCULAR | Status: AC
Start: 2019-05-24 — End: 2019-05-24
  Administered 2019-05-24: 18:00:00 2 g via INTRAVENOUS
  Filled 2019-05-24: qty 2000

## 2019-05-24 MED ORDER — CEFAZOLIN SODIUM 1 G IJ SOLR
2.00 g | Freq: Once | INTRAMUSCULAR | Status: DC
Start: 2019-05-24 — End: 2019-05-24

## 2019-05-24 MED ORDER — ALTEPLASE 2 MG IJ SOLR
2.0000 mg | INTRAMUSCULAR | Status: AC | PRN
Start: 2019-05-24 — End: 2019-05-24
  Filled 2019-05-24: qty 2

## 2019-05-24 MED ORDER — CILOSTAZOL 100 MG PO TABS
100.0000 mg | ORAL_TABLET | Freq: Two times a day (BID) | ORAL | Status: DC
Start: 2019-05-24 — End: 2019-05-24

## 2019-05-24 NOTE — Progress Notes (Signed)
[facility] [place] orders in today.  Per previous CM notes, DCP is to [facility].  Also based on previous CM notes, pt was approved for [place] and received her [place] Medicaid card in the mail.    Pt was    This CM sent message to [place] pt was approved for [facility].    Awaiting acceptance at SNF.    Brayton Mars, MSW  x: [phone]

## 2019-05-24 NOTE — Progress Notes (Signed)
Hands off report received from [place]. RN; Arrived to unit a/ox4; No complaint of discomfort;  Rt. CVC inserted yesterday ; Placement verified. For re-initiation of Hemodialysis.  Dressing intact. VSS.      05/24/19 1130   Vitals   Temp 98.2 F (36.8 C)   Resp Rate 16   BP 146/57   SpO2 98 %   O2 Device None (Room air)   Assessment   Mental Status Alert;Oriented;Cooperative   Cardiac (WDL) WDL   Cardiac Regularity Regular   Cardiac Symptoms None   Cardiac Rhythm Normal Sinus Rhythm   Respiratory  WDL   Respiratory Pattern Regular   Bilateral Breath Sounds Clear   R Breath Sounds Clear   L Breath Sounds Clear   Cough Spontaneous   Edema  WDL   Generalized Edema None   RLE Edema +1   LLE Edema +1   General Skin Color Appropriate for ethnicity   Skin Condition/Temp Warm;Dry   Gastrointestinal (WDL) WDL   Abdomen Inspection Soft   GI Symptoms None   Mobility Ambulatory with Assistance   Permacath/Temporary Catheter 05/23/19 Permacath Right   Placement Date/Time: 05/23/19 1342   Inserted by: Papadouris  Access Type: Permacath  Orientation: Right  Access Location: Tunneled, Chest  Catheter Tip Location: svc  Central Line Infection Prevention Education provided?: Yes;No  Hand Hygiene: [place]...   Dressing Status and Intervention Dressing Intact   Pain Assessment   Charting Type Assessment   Numeric Pain Scale   Pain Score 0   POSS Score 1   Hemodialysis Comments   Pre-Hemodialysis Comments Timeout safety check- done   Hemodialysis History Information   Outpatient Nephrologist Dr. Monica Martinez

## 2019-05-24 NOTE — Plan of Care (Signed)
Patient S/P perm cath placement , site clean, dry and intact.  AO X4, complained of pain, MD notified, ordered prn pain medication.  Pt on RA, no SOB, denies chest pain, no fever, nausea or vomiting noted during this shift.  Tele monitoring in place, noted with NSR on the monitor. Acucheck done, insulin administered as ordered.  Medication given as ordered.   Fall bundle in place, encouraged patient to call for assistance as needed.   Purposeful rounding done.    Problem: Safety  Goal: Patient will be free from infection during hospitalization  Flowsheets (Taken 05/24/2019 0137)  Free from Infection during hospitalization:   Assess and monitor for signs and symptoms of infection   Monitor lab/diagnostic results   Monitor all insertion sites (i.e. indwelling lines, tubes, urinary catheters, and drains)   Encourage patient and family to use good hand hygiene technique     Problem: Pain  Goal: Pain at adequate level as identified by patient  Flowsheets (Taken 05/24/2019 0136)  Pain at adequate level as identified by patient:   Identify patient comfort function goal   Assess for risk of opioid induced respiratory depression, including snoring/sleep apnea. Alert healthcare team of risk factors identified.   Assess pain on admission, during daily assessment and/or before any "as needed" intervention(s)   Reassess pain within 30-60 minutes of any procedure/intervention, per Pain Assessment, Intervention, Reassessment (AIR) Cycle   Evaluate if patient comfort function goal is met   Evaluate patient's satisfaction with pain management progress     Problem: Renal Instability  Goal: Fluid and electrolyte balance are achieved/maintained  Flowsheets (Taken 05/24/2019 0136)  Fluid and electrolyte balance are achieved/maintained:   Monitor intake and output every shift   Monitor/assess lab values and report abnormal values   Provide adequate hydration    Patient S/P perm cath placement , site clean, dry and intact.  AO X4,  complained of pain, MD notified, ordered prn pain medication.  Pt on RA, no SOB, denies chest pain, no fever, nausea or vomiting noted during this shift.  Tele monitoring in place, noted with NSR on the monitor. Acucheck done, insulin administered as ordered.  Medication given as ordered.   Fall bundle in place, encouraged patient to call for assistance as needed.   Purposeful rounding done.

## 2019-05-24 NOTE — PT Progress Note (Signed)
Physical Therapy Note    [facility]  Physical Therapy Attempt Note    Patient:  [name] MRN#:  [id number]  Unit:  [place] Room/Bed:  A2315/A2315.01      Physical Therapy treatment attempted on 05/24/2019 at 6:14 PM unable to complete secondary to patient eating. Therapist will reattempt as able.    Bobetta Lime PT DPT  05/24/2019 6:14 PM

## 2019-05-24 NOTE — Progress Notes (Signed)
[facility] PROGRESS NOTE  Aaron Edelman, x [phone] [facility] Spectralink)      Date Time: 05/24/19 12:07 PM  Patient Name: [name]  Attending Physician: Dolphus Jenny, MD    CC: follow-up ESRD  HD note  Seen on dialysis     Assessment:     1. PD associated peritonitis - E Coli   - cell count improving   - no need to remove catheter  2. ESRD- on CAPD switching to IHD  3. Anemia- CKD + Fe deficiency now exacerbated by GI bleed  4. Volume overload: improving slowly with PD  5. Hypokalemia  6. Hypomagnesemia - resolved        Recommendations:   - hd today tolerating procedure having catheter issues   - will keep PD catheter in place  - will request CM to settup outpatient HD at [place];- will need transportation should not discharge until arranged     Case discussed with:  Pt, HD-RN  [place], MD   [facility]  [phone] (office)  X [phone] ([place])      Subjective:no abdominal pain  She feels burned out doing PD and has no help at home     Review of Systems:   Cardio: no chest pain, no edema  Pulm: no SOB        Physical Exam:     Vitals:    05/24/19 0845 05/24/19 1100 05/24/19 1130 05/24/19 1145   BP: 155/86  146/57 145/68   Pulse: 65 73     Resp:   16    Temp:   98.2 F (36.8 C)    TempSrc:       SpO2:   98%    Weight:       Height:           Intake and Output Summary (Last 24 hours) at Date Time    Intake/Output Summary (Last 24 hours) at 05/24/2019 1207  Last data filed at 05/24/2019 0500  Gross per 24 hour   Intake 770 ml   Output 200 ml   Net 570 ml   Dialysis Treatment Data:  Blood flow rate:  200   Dialysate flow rate:  700  Ultrafiltration rate:  840    Dialysate potassium:  3.0  Dialysate calcium:  2.5     Arterial pressure:  -260  Venous pressure:  mmHg 320      General: obese female,comfortable   Cardiovascular: regular rate and rhythm  Lungs: clear to auscultation bilaterally, bilateral air entry,normal work of breathing  Abdomen: soft, NT,ND;LLQ  PD catheter  Extremities: improved edema      Meds:      Scheduled Meds: PRN Meds:    amLODIPine, 5 mg, Oral, Q12H  atorvastatin, 40 mg, Oral, Daily  carvedilol, 25 mg, Oral, Q12H SCH  ceFAZolin, 2 g, Intravenous, Once  darbepoetin alfa, 60 mcg, Subcutaneous, Weekly  famotidine, 10 mg, Oral, Q12H SCH  insulin glargine, 15 Units, Subcutaneous, QHS  insulin lispro, 1-4 Units, Subcutaneous, QHS  insulin lispro, 1-8 Units, Subcutaneous, TID AC  insulin lispro, 3 Units, Subcutaneous, TID MEALS  lactulose, 30 g, Oral, Q8H SCH  pantoprazole, 40 mg, Oral, BID  sevelamer, 800 mg, Oral, TID MEALS          Continuous Infusions:   sodium chloride 100 mL/hr at 05/14/19 1828    lactated ringers Stopped (05/15/19 1409)    sodium chloride, , PRN  acetaminophen, 650 mg, Q6H PRN  bisacodyl, 10 mg, Daily PRN  dextrose, 15 g of glucose, PRN   And  dextrose, 12.5 g, PRN   And  glucagon (rDNA), 1 mg, PRN  naloxone, 0.2 mg, PRN  ondansetron, 4 mg, Q6H PRN   Or  ondansetron, 4 mg, Q6H PRN              Labs:     Recent Labs   Lab 05/24/19  0337 05/23/19  0451 05/22/19  0440   WBC 8.93 8.64 7.78   Hgb 9.9* 9.8* 9.9*   Hematocrit 32.2* 32.8* 33.1*   Platelets 212 225 225     Recent Labs   Lab 05/24/19  0337 05/23/19  0451 05/22/19  0440 05/20/19  0323 05/18/19  0844 05/17/19  1337   Sodium 141 140 139  More results in Results Review 140  --    Potassium 3.7 3.7 4.4  More results in Results Review 4.2  --    Chloride 101 102 103  More results in Results Review 102  --    CO2 31* 28 27  More results in Results Review 32*  --    BUN 26.0* 24.0* 25.0*  More results in Results Review 25.0*  --    Creatinine 5.0* 4.3* 4.6*  More results in Results Review 4.4*  --    Calcium 8.0* 7.9* 7.8*  More results in Results Review 7.8*  --    Albumin 1.8* 1.7* 1.7*  More results in Results Review 1.4*  --    Phosphorus  --   --   --   --  3.6  --    Magnesium  --   --   --   --   --  1.8   Glucose 253* 316* 342*  More results in Results Review 255*  --     EGFR 10.5 12.5 11.6  More results in Results Review 12.2  --    More results in Results Review = values in this interval not displayed.           Signed by: Laural Golden, MD

## 2019-05-24 NOTE — PT Progress Note (Signed)
Physical Therapy Note    [facility]  Physical Therapy Treatment    Patient:  [name]  MRN#:  [id number]  Unit:  [place]  Room/Bed:  A2315/A2315.01    Time of treatment:  Time Calculation  PT Received On: 05/24/19  Start Time: 1846  Stop Time: 1909  Time Calculation (min): 23 min            Chart Review and Collaboration with Care Team: 5 minutes, not included in above time.    PT Visit Number: 2    Precautions:   Precautions  Weight Bearing Status: no restrictions  Other Precautions: falls    Personal Protective Equipment (PPE)  gloves, procedure mask and pt wore procedure mask    Updated X-Rays/Tests/Labs:  Lab Results   Component Value Date/Time    HGB 9.9 (L) 05/24/2019 03:37 AM    HCT 32.2 (L) 05/24/2019 03:37 AM    K 3.7 05/24/2019 03:37 AM    NA 141 05/24/2019 03:37 AM    INR 0.9 03/18/2006 03:00 PM    TROPI 0.04 05/17/2019 06:20 PM    TROPI 0.05 05/17/2019 01:37 PM    TROPI 0.01 03/18/2006 06:10 PM       All imaging reviewed, please see chart for details.      Subjective:  "I've got a place in a senior living building, my daughter fixed it up for me."  Pt reports good family support.          Pain Assessment  Pain Assessment: (Neck pain post dialysis (pt reports blocked cath during))           Patient's medical condition is appropriate for Physical Therapy intervention at this time.  Patient is agreeable to participation in the therapy session.      Objective:  Observation of Patient/Vital Signs  Resting BP 150/65, unable to obtain BP on return from amb due to equipment not working.     Pt reports lightheadedness on position changes w/ increased dizziness on return from ambulation. Recovers with seated rest break.     Cognition/Neuro Status  Arousal/Alertness: Appropriate responses to stimuli  Attention Span: Appears intact  Behavior: attentive;calm;cooperative    Musculoskeletal Examination  Gross ROM  Right Lower Extremity ROM: within functional limits(BLE  pitting edema L>R, Pt reports B plantar numbness*)  Left Lower Extremity ROM: within functional limits  Gross Strength  Right Lower Extremity Strength: 3+/5  Left Lower Extremity Strength: 3+/5     Functional Mobility  Supine to Sit: Supervision  Sit to Supine: Minimal Assist  Sit to Stand: Stand by Assist;Increased Time;Increased Effort  Stand to Sit: Supervision     Locomotion  Ambulation: Contact Guard Assist;with front-wheeled walker  Pattern: R foot decreased clearance;L foot decreased clearance;Wide BOS;decreased cadence;decreased step length  Distance Walked (ft) (Step 6,7): 55 Feet      Educated the patient to role of physical therapy, plan of care, goals of therapy and safety with mobility and ADLs with verbalized understanding.    Patient left in bed with all medical equipment in place and call bell and all personal items/needs within reach (of note, pt received without alarm in place).  RN notified of session outcome.      Assessment:  Pt continues to progress w/ increased independence with functional transfers and gait.  D/c rec updated given pt progression.  Pt will continue to benefit from intermittent PT to maximize gait safety and ability to participate in functional mobility on  d/c.         PMP Activity: Step 7 - Walks out of Room  Distance Walked (ft) (Step 6,7): 55 Feet    Plan:  Treatment/Interventions: Gait training, Exercise, Neuromuscular re-education, Functional transfer training, LE strengthening/ROM, Endurance training, Patient/family training, Equipment eval/education, Bed mobility, Compensatory technique education      PT Frequency: 2-3x/wk   Continue plan of care.    Goals:  Goals  Goal Formulation: With patient  Time for Goal Acheivement: 3 visits  Goals: Select goal  Pt Will Go Supine To Sit: with stand by assist, to maximize functional mobility and independence, Goal met  Pt Will Perform Sit to Stand: with stand by assist, to maximize functional mobility and independence, Goal met  Pt  Will Demo Standing Balance: 4/5 indep, moves/returns center of grav 1-2"/1 plane, to maximize functional mobility and independence, Partly met  Pt Will Transfer Bed/Chair: to maximize functional mobility and independence, with supervision, Not met  Pt Will Ambulate: 51-100 feet, with supervision, to maximize functional mobility and independence, Partly met  Pt Will Perform Home Exer Program: with stand by assist, to maximize functional mobility and independence, Not met        DME Recommended for Discharge: Front wheel walker, [facility]  Discharge Recommendation: Home with supervision, Home with home health PT, Home with home health OT    Bobetta Lime PT DPT  05/24/2019 7:22 PM

## 2019-05-24 NOTE — Plan of Care (Signed)
Discharge Recommendation: Home with supervision, Home with home health PT, Home with home health OT  DME Recommended for Discharge: Front wheel walker, [facility]    Is an Occupational Therapy Evaluation Indicated at this time? This patient is already on OT caseload.     Treatment/Interventions: Personnel officer, Exercise, Neuromuscular re-education, Functional transfer training, LE strengthening/ROM, Endurance training, Patient/family training, Equipment eval/education, Bed mobility, Compensatory technique education  PT Frequency: 2-3x/wk     PMP Activity: Step 7 - Walks out of Room  Distance Walked (ft) (Step 6,7): 55 Feet  (Please See Therapy Evaluation for device and assistance level needed)    Goals:   Goals  Goal Formulation: With patient  Time for Goal Acheivement: 3 visits  Goals: Select goal  Pt Will Go Supine To Sit: with stand by assist, to maximize functional mobility and independence, Goal met  Pt Will Perform Sit to Stand: with stand by assist, to maximize functional mobility and independence, Goal met  Pt Will Demo Standing Balance: 4/5 indep, moves/returns center of grav 1-2"/1 plane, to maximize functional mobility and independence, Partly met  Pt Will Transfer Bed/Chair: to maximize functional mobility and independence, with supervision, Not met  Pt Will Ambulate: 51-100 feet, with supervision, to maximize functional mobility and independence, Partly met  Pt Will Perform Home Exer Program: with stand by assist, to maximize functional mobility and independence, Not met

## 2019-05-24 NOTE — Progress Notes (Signed)
HD tx. Completed w/ no further flow issues; Rt. CVC worked well after instilling Cathflo Activase; Good flow achieved @ 350BFR. ; Unable to achieve UF Goal ordered due to BP issue. SBP dropped low 90's at one point during the tx. Pt. Became pale and almost pass out. Net UF 1.6L. Pt. Hemodynamically stable post HD. Report given to Barton Dubois RN.   05/24/19 1725   Treatment Summary   Time Off Machine 1715   Duration of Treatment (Hours) 3.5   Dialyzer Clearance Moderately streaked   Fluid Volume Off (mL) 2500   Prime Volume (mL) 200   Rinseback Volume (mL) 200   Fluid Given: Normal Saline (mL) 450   Fluid Given: PRBC  0 mL   Fluid Given: Albumin (mL) 0   Fluid Given: Other (mL) 0   Total Fluid Given 850   Hemodialysis Net Fluid Removed 1650   Post Treatment Assessment   Post-Treatment Weight (Kg) 115.9   Patient Response to Treatment tolerated   Additional Dialyzer Used 0   Permacath/Temporary Catheter 05/23/19 Permacath Right   Placement Date/Time: 05/23/19 1342   Inserted by: Papadouris  Access Type: Permacath  Orientation: Right  Access Location: Tunneled, Chest  Catheter Tip Location: svc  Central Line Infection Prevention Education provided?: Yes;No  Hand Hygiene: [place]...   Catheter Lumen Volume Venous 1.7 mL   Catheter Lumen Volume Arterial 1.7 mL   Dressing Status and Intervention Dressing Intact   Tego Caps on Catheter Yes   NEW Tego Caps placed (Date) 05/23/19   Vitals   Heart Rate 63   Resp Rate 17   BP 146/75   SpO2 99 %   O2 Device None (Room air)   Assessment   Mental Status Alert;Oriented;Cooperative   Cardiac (WDL) WDL   Cardiac Regularity Regular   Cardiac Symptoms None   Cardiac Rhythm Normal Sinus Rhythm   Respiratory  WDL   Respiratory Pattern Regular   Bilateral Breath Sounds Clear   R Breath Sounds Clear   L Breath Sounds Clear   Cough Spontaneous   Edema  WDL   Generalized Edema None   RLE Edema +1   LLE Edema +1   General Skin Color Appropriate for ethnicity   Skin Condition/Temp Warm;Dry    Gastrointestinal (WDL) WDL   Abdomen Inspection Soft   GI Symptoms None   Mobility Ambulatory with Assistance   Pain Assessment   Charting Type Assessment   Numeric Pain Scale   Pain Score 3   POSS Score 1   Pain Location Shoulder   Pain Orientation Right   Pain Descriptors Sore   Pain Frequency Increases with movement   Effect of Pain on Daily Activities moderate   Patient's Stated Comfort Functional Goal 0   Pain Intervention(s) Heat applied   Multiple Pain Sites No   Education   Person taught Patient   Knowledge basis Minimal   Topics taught Access care;S&S of infection   Teaching Tools Explain   Reponse Verbalizes Understanding   Bedside Nurse Communication   Name of bedside RN - post dialysis Emilio Math

## 2019-05-24 NOTE — Progress Notes (Addendum)
Patient is alert and oriented x4. Patient denies chest pain and shortness of breath.  Patient is ambulatory in room withassistance. No BM noted during the shift. Patient's safety was maintained during the shift. Patient continues  on tele. Patient vital signs and lab work were monitored and communicated. Patient was educated on all her medications and precautions to be taken. Returned from dialysis on stable condition. Will continue plan of care.

## 2019-05-24 NOTE — Plan of Care (Signed)
Problem: Renal Instability  Goal: Fluid and electrolyte balance are achieved/maintained  Flowsheets (Taken 05/24/2019 1744)  Fluid and electrolyte balance are achieved/maintained:   Monitor/assess lab values and report abnormal values   Monitor daily weight   Assess and reassess fluid and electrolyte status   Monitor for muscle weakness  Goal: Nutritional intake is adequate  Flowsheets (Taken 05/24/2019 1744)  Nutritional intake is adequate:   Monitor daily weights   Consult/collaborate with Clinical Nutritionist  Goal: Free from infection  Flowsheets (Taken 05/24/2019 1744)  Free from infection: Monitor/assess for signs and symptoms of infection     Problem: Patient Receiving Advanced Renal Therapies  Goal: Therapy access site remains intact  Flowsheets (Taken 05/24/2019 1744)  Therapy access site remains intact:   Assess therapy access site   Change therapy access site dressing as needed

## 2019-05-24 NOTE — Discharge Summary (Addendum)
[facility]      Patient: [name]  Admission Date: 05/12/2019   DOB: 1956-01-14  Discharge Date: 05/24/2019    MRN: [id number]  Discharge Attending:Ayush Boulet E Aulden Calise     Referring Physician: No primary care provider on file.  PCP: No primary care provider on file.       DISCHARGE SUMMARY     Discharge Information   Admission Diagnosis:   Peritonitis    Discharge Diagnosis:   Active Hospital Problems    Diagnosis    Peritonitis    Upper GI bleed    ESRD (end stage renal disease) on dialysis    Iron deficiency anemia secondary to inadequate dietary iron intake    Sideropenic dysphagia    Peritoneal dialysis catheter dysfunction, initial encounter        Admission Condition: serious  Discharge Condition: Stable   Consultants: GI, Nephrology   Functional Status: Ambulatory   Discharged to: SNF     Discharge Medications:     Medication List      START taking these medications    amLODIPine 5 MG tablet  Commonly known as: NORVASC  Take 1 tablet (5 mg total) by mouth every 12 (twelve) hours     ceFAZolin 2 g in dextrose 5 % 50 mL IVPB  Infuse 2 g into the vein once for 1 dose     insulin glargine 100 UNIT/ML injection  Commonly known as: LANTUS  Inject 15 Units into the skin nightly     pantoprazole 40 MG tablet  Commonly known as: PROTONIX  Take 1 tablet (40 mg total) by mouth 2 (two) times daily        CONTINUE taking these medications    atorvastatin 40 MG tablet  Commonly known as: LIPITOR     carvedilol 25 MG tablet  Commonly known as: COREG     famotidine 40 MG tablet  Commonly known as: PEPCID     furosemide 40 MG tablet  Commonly known as: LASIX     gabapentin 600 MG tablet  Commonly known as: NEURONTIN     insulin aspart 100 UNIT/ML injection  Commonly known as: NovoLOG     oxyCODONE-acetaminophen 5-325 MG per tablet  Commonly known as: PERCOCET     sevelamer 800 MG tablet  Commonly known as: RENVELA        STOP taking these medications    amLODIPine-atorvastatin 10-10 MG per tablet  Commonly  known as: CADUET     aspirin EC 81 MG EC tablet     cilostazol 100 MG tablet  Commonly known as: PLETAL           Where to Get Your Medications      Information about where to get these medications is not yet available    Ask your nurse or doctor about these medications   amLODIPine 5 MG tablet   ceFAZolin 2 g in dextrose 5 % 50 mL IVPB   insulin glargine 100 UNIT/ML injection   pantoprazole 40 MG tablet             Hospital Course   Presentation History   Per Dr Ann Maki "[name] is a 64 y.o. female with history of hypertension, hyperlipidemia, insulin-dependent diabetes mellitus, COPD, end-stage renal disease on peritoneal dialysis who presents to the emergency department for evaluation of a 3-day history of intractable nausea, vomiting and nonbloody diarrhea.  She states that she ate an ultimate omelette at [facility] on 05/09/19 and following that  her symptoms started.  She complains of constant diffuse abdominal pain described as aching pain, reaching 10 out of 10 in severity which is exacerbated by movement.  She also reports a fever 102 last night along with chills, feeling dizzy and near syncope, generalized weakness. She reports no history of renal stones.  She does report having a UTI in the past.  She states that she had noted blood in one of her episodes of emesis.  She states that she was hospitalized 1 month ago at [facility] where she had an upper endoscopy but she cannot recall the results.  States that she was started on hemodialysis last May 2020 and after that she was transitioned to peritoneal dialysis at home.  She states that she intermittently makes urine and last week she had frequent urination.  She reports no headache, visual changes, syncope, loss of consciousness, hematochezia, melena, dysuria, hematuria, numbness/tingling, focal weakness.   In the ED, patient was hypertensive on presentation, afebrile, not tachycardic or hypoxic.  Labs significant for glucose of 551, creatinine  4.7.  She received 3 L normal saline bolus and vancomycin and Zosyn in the ED    See HPI for details.    Hospital Course (12 Days)   Diane Fuquay McKethanis a 64 y.o.femalewith history ofhypertension, hyperlipidemia, insulin-dependent diabetes mellitus, COPD, end-stage renal disease on peritoneal dialysiswho presents to the emergency department for evaluation of a 3-day history of intractable nausea,vomiting and nonbloody diarrhea, admitted for viral gastroenteritis and UTI.  She was found to have peritonitis.     By problem:  #UGIB - Patient had 1 episode of 400 cc coffee ground emesis on 4/20 per RN  #Severe esophagitis  -s/p EGD on 4/21: Grade D gastritis  -Continue Protonix 40 mg BID  -Ok to restart ASA and pletal per GI however was no longer on asa/pletal per patient since Aug 2020 due to GI bleeding  -H/H stable   -GI recommendations appreciated. Outpatient follow-up for repeat EGD.    Chest pain  -EKG NSR, troponin x2 negative.  -D dimer elevated, CTA chest negative for PE.     #Ecoli Peritonitis, pansensitive   #Abdominal pain  #Nausea, vomiting, diarrhea - Resolved  #UTI  -Likelydue toviral gastroenteritis/UTI/Peritonitis  -Peritoneal fluid with 51,000 WBC's. Improved to 200s   -Blood cultures NGTD, lactic acid normal and lipase normal  -Urine culture: >100,000 CFU/ML of multiple bacterial morphotypes   -CT abd/pelvis wo contrastrevealsno evidence of bowel obstruction, bowel ischemia, perforation, diverticulitis, pancreatitis, nephrolithiasis and shows intraperitoneal fluid  -PRN antiemetics for nausea/vomiting  -Continue IP cefazolin with HD-- for total 3 weeks  -ID recommendations appreciated    #Hypokalemia  #Hypomagnesemia  -Resolved     #ESRD on peritoneal dialysis--> HD  -Started on dialysis on 05/2018  -CT abd/pelvis shows moderate amount of ascites ? Dialysis fluid  -Under care everywhere, noted that Cr from 10/2018-11/2018 has been 3.8-5 range  -patient switching to HD per request  -s/p   tunneled catheter placement.   -Nephrology following    #Hypertension  -Continue home amlodipine, coreg    #Hyperglycemia  #Type 2 DM  -Continue home lantus, SSI, ac/hs accuchecks  -HbA1c 9.8%    #Hyperlipidemia   -Continue home statin    #Iron deficiency anemia  #Anemia of Chronic disease   -Completed IV Venofer 3/3  -S/p 1 unit pRBC transfusion     Delay in [place] due to pt awaiting placement for HD    Procedures/Imaging:   CT Angiogram Chest  Result Date: 05/18/2019     No evidence of pulmonary embolism. Small bilateral pleural effusions. Large hiatal hernia. Trace perihepatic ascites. Other nonurgent findings as above. Lonia Skinner, MD  05/18/2019 9:58 AM    Tunneled Cath Placement (Permcath)    Result Date: 05/23/2019   Technically successful placement of an  23cm Equistream catheter via the right internal jugular vein as described above. Phlebitic changes in the right internal jugular vein from prior catheter placement. Dimitrios Papadouris, MD  05/23/2019 3:51 PM           Best Practices   Was the patient admitted with either a CHF Exacerbation or Pneumonia? No     Progress Note/Physical Exam at Discharge     Subjective: Pt seen and examined at bedside.   Feels well. Wanted to go on cruise with family. No sob chest pain fever chills nausea/vomiting or bleedig.    Vitals:    05/24/19 1100 05/24/19 1130 05/24/19 1145 05/24/19 1200   BP:  146/57 145/68 166/71   Pulse: 73      Resp:  16 18    Temp:  98.2 F (36.8 C) 98.2 F (36.8 C)    TempSrc:       SpO2:  98% 100%    Weight:       Height:           General: NAD, AAOx3, in bed  HEENT: perrla, eomi, sclera anicteric, OP: Clear, MMM  Neck: supple, FROM, no LAD  Cardiovascular: RRR, no m/r/g  Lungs: CTAB, no w/r/r  Abdomen: soft, +BS, NT/ND, no masses, no g/r  Extremities: no C/C/E  Skin: no rashes or lesions noted  Neuro: non focal, moves all extremities        Diagnostics     Labs/Studies Pending at Discharge: No    Last Labs   Recent Labs   Lab 05/24/19  0337  05/23/19  0451 05/22/19  0440   WBC 8.93 8.64 7.78   RBC 3.32* 3.40* 3.38*   Hgb 9.9* 9.8* 9.9*   Hematocrit 32.2* 32.8* 33.1*   MCV 97.0* 96.5* 97.9*   Platelets 212 225 225       Recent Labs   Lab 05/24/19  0337 05/23/19  0451 05/22/19  0440 05/21/19  0425 05/20/19  0323 05/18/19  0844 05/17/19  1337   Sodium 141 140 139 140   140 137  More results in Results Review  --    Potassium 3.7 3.7 4.4 4.5   4.6 4.1  More results in Results Review  --    Chloride 101 102 103 102   102 100  More results in Results Review  --    CO2 31* 28 27 29   29 28   More results in Results Review  --    BUN 26.0* 24.0* 25.0* 28.0*   27.0* 25.0*  More results in Results Review  --    Creatinine 5.0* 4.3* 4.6* 4.6*   4.6* 4.2*  More results in Results Review  --    Glucose 253* 316* 342* 414*   415* 373*  More results in Results Review  --    Calcium 8.0* 7.9* 7.8* 7.9*   8.1* 8.1*  More results in Results Review  --    Magnesium  --   --   --   --   --   --  1.8   More results in Results Review = values in this interval not displayed.  Microbiology Results     Procedure Component Value Units Date/Time    Blood Culture Aerobic/Anaerobic #1 [[id number]] Collected: 05/12/19 1855    Specimen: Arm from Blood, Venipuncture Updated: 05/18/19 0021    Narrative:      ORDER#: [id number]                                    ORDERED BY: Dwyane Dee, VINOD  SOURCE: Blood, Venipuncture Arm                      COLLECTED:  05/12/19 18:55  ANTIBIOTICS AT COLL.:                                RECEIVED :  05/12/19 21:53  Culture Blood Aerobic and Anaerobic        FINAL       05/18/19 00:21  05/18/19   No growth after 5 days of incubation.      Blood Culture Aerobic/Anaerobic #2 [[id number]] Collected: 05/12/19 1855    Specimen: Arm from Blood, Venipuncture Updated: 05/18/19 0021    Narrative:      ORDER#: [id number]                                    ORDERED BY: Dwyane Dee, VINOD  SOURCE: Blood, Venipuncture Arm                      COLLECTED:  05/12/19  18:55  ANTIBIOTICS AT COLL.:                                RECEIVED :  05/12/19 21:53  Culture Blood Aerobic and Anaerobic        FINAL       05/18/19 00:21  05/18/19   No growth after 5 days of incubation.      COVID-19 (SARS-COV-2) [employer] [facility]) [[id number]] Collected: 05/12/19 1611    Specimen: Nasopharyngeal Swab from Nasopharynx Updated: 05/12/19 1648     Purpose of COVID testing Screening     SARS-CoV-2 Specimen Source Nasopharyngeal     SARS CoV 2 Overall Result Negative     Comment: Test performed using the Abbott ID NOW EUA assay.  Please see Fact Sheets for patients and providers located at:  http://olson-hall.info/  This test is for the qualitative detection of SARS-CoV-2  (COVID19) nucleic acid. Viral nucleic acids may persist in vivo,  independent of viability. Detection of viral nucleic acid does  not imply the presence of infectious virus, or that virus  nucleic acid is the cause of clinical symptoms. Negative  results should be treated as presumptive and, if inconsistent  with clinical signs and symptoms or necessary for patient  management, should be tested with an alternative molecular  assay. Negative results do not preclude SARS-CoV-2 infection  and should not be used as the sole basis for patient  management decisions. Invalid results may be due to inhibiting  substances in the specimen and recollection should occur.         Narrative:      o Collect and clearly label specimen type:  o Upper respiratory specimen: One Nasopharyngeal Dry Swab NO  Transport Media.  o Hand  deliver to laboratory ASAP  Indication for testing->Extended care facility admission to  semi private room    Culture + Gram Lenise Arena, Body Fluid [[id number]] Collected: 05/13/19 1447    Specimen: Body Fluid from Ascites Fluid Updated: 05/15/19 1818    Narrative:      [id number]_ called Micro Results of fluid. Results read back [id number], by [id number]  on 05/15/2019 at 18:17  ORDER#: [id number]                                     ORDERED BY: RANA, IRMINDRA  SOURCE: Ascites Fluid ascites                        COLLECTED:  05/13/19 14:47  ANTIBIOTICS AT COLL.:                                RECEIVED :  05/13/19 19:33  [id number]_ called Micro Results of fluid. Results read back [id number], by [id number] on 05/15/2019 at 18:17  Stain, Gram                                FINAL       05/13/19 21:48  05/13/19   Many WBCs             No organisms seen  Culture and Gram Stain, Aerobic, Body FluidFINAL       05/15/19 18:18   +  05/15/19   Very light growth of Escherichia coli      _____________________________________________________________________________                                     E.coli       ANTIBIOTICS                     MIC  INTRP      _____________________________________________________________________________  Amoxicillin/CA                  8/4    S        Ampicillin                      >16    R        Ampicillin/sulbactam           16/8    I        Aztreonam                       <=2    S        Cefazolin                        2     S        Cefepime                        <=1    S        Cefoxitin                       <=4    S  Ceftazidime                     <=2    S        Ceftriaxone                     <=1    S        Cefuroxime                      <=4    S        Ciprofloxacin                 <=0.25   S        Ertapenem                     <=0.25   S        Gentamicin                      <=2    S        Levofloxacin                   <=0.5   S        Meropenem                      <=0.5   S        Piperacillin/Tazobactam        <=2/4   S        Tetracycline                    >8     R        Trimethoprim/Sulfamethoxazole  >2/38   R        _____________________________________________________________________________            S=SUSCEPTIBLE     I=INTERMEDIATE     R=RESISTANT                            N/S=NON-SUSCEPTIBLE  _____________________________________________________________________________      Urine culture  [[id number]] Collected: 05/12/19 1611    Specimen: Bladder Urine Updated: 05/14/19 0909    Narrative:      Replace urinary catheter prior to obtaining the urine culture  if it has been in place for greater than or equal to 14  days:->N/A No Foley  Indications for U/A Reflex to Micro - Reflex to  Culture:->Suprapubic Pain/Tenderness or Dysuria  ORDER#: [id number]                                    ORDERED BY: HAILE, LYDIA  SOURCE: Urine                                        COLLECTED:  05/12/19 16:11  ANTIBIOTICS AT COLL.:                                RECEIVED :  05/12/19 16:26  Culture Urine  FINAL       05/14/19 09:09  05/14/19   >100,000 CFU/ML of multiple bacterial morphotypes present.             Possible contamination, appropriate recollection is             requested if clinically indicated.             Patient Instructions   Discharge Diet: renal diet   Discharge Activity: as tolerated     Follow Up Appointment:  Follow-up Information     Ronie Spies, MD Follow up.    Specialties: Gastroenterology, Internal Medicine  Contact information:  [address]  [place] [address]  [phone]             Ernst Breach, MD Follow up in 1 week(s).    Specialties: Infectious Disease, Internal Medicine  Contact information:  [place] [address]  [phone]                    Time spent examining patient, discussing with patient/family regarding hospital course, chart review, reconciling medications and discharge planning: 45 minutes.    Lavone Nian Addasyn Mcbreen    1:23 PM 05/24/2019

## 2019-05-24 NOTE — Plan of Care (Addendum)
Nephrology Plan of Care    Pt will switch to HD today    I will cancel the PD order    I will order cefazolin 2gm IV to be given today after HD    Next HD tentatively on Monday for additional UF as tolerated.     Pt will need PD flush completed weekly    Full Note to be completed by my esteemed colleagues (Drs Building surveyor or [employer]).    Bea Laura, MD

## 2019-05-24 NOTE — Progress Notes (Signed)
CCPD completed without any issues. 883 ml Total UF.     05/24/19 0830   Completing APD/CCPD (Taking the Patient OFF)    Effluent Appearance Clear   Specimen Collected No   Last Fill Volume (mL) 2500 mL   Volume Collected in Drainage Bag (mL) 883 mL  (total UF)   Peritoneal Dialysis Comments CCPD completed   Peritoneal Dialysis Catheter   No Placement Date or Time found.   Present on Admission?: Yes  Catheter Type: Baxter   Site Assessment Clean;Dry;Intact

## 2019-05-24 NOTE — Plan of Care (Signed)
Problem: Moderate/High Fall Risk Score >5  Goal: Patient will remain free of falls  Outcome: Progressing  Flowsheets (Taken 05/24/2019 1855)  High (Greater than 13):   HIGH-Visual cue at entrance to patient's room   HIGH-Bed alarm on at all times while patient in bed   HIGH-Apply yellow "Fall Risk" arm band   HIGH-Initiate use of floor mats as appropriate   HIGH-Consider use of low bed     Problem: Safety  Goal: Patient will be free from injury during hospitalization  Outcome: Progressing  Flowsheets (Taken 05/24/2019 1855)  Patient will be free from injury during hospitalization:   Assess patient's risk for falls and implement fall prevention plan of care per policy   Provide and maintain safe environment   Use appropriate transfer methods   Include patient/ family/ care giver in decisions related to safety   Hourly rounding   Provide alternative method of communication if needed (communication boards, writing)  Goal: Patient will be free from infection during hospitalization  Outcome: Progressing  Flowsheets (Taken 05/24/2019 1855)  Free from Infection during hospitalization:   Assess and monitor for signs and symptoms of infection   Monitor lab/diagnostic results   Monitor all insertion sites (i.e. indwelling lines, tubes, urinary catheters, and drains)   Encourage patient and family to use good hand hygiene technique     Problem: Pain  Goal: Pain at adequate level as identified by patient  Outcome: Progressing  Flowsheets (Taken 05/24/2019 1855)  Pain at adequate level as identified by patient:   Identify patient comfort function goal   Assess pain on admission, during daily assessment and/or before any "as needed" intervention(s)   Evaluate if patient comfort function goal is met   Include patient/patient care companion in decisions related to pain management as needed     Problem: Renal Instability  Goal: Fluid and electrolyte balance are achieved/maintained  Outcome: Progressing  Flowsheets (Taken 05/24/2019  1855)  Fluid and electrolyte balance are achieved/maintained:   Monitor intake and output every shift   Monitor/assess lab values and report abnormal values   Provide adequate hydration   Assess and reassess fluid and electrolyte status

## 2019-05-25 LAB — COMPREHENSIVE METABOLIC PANEL
ALT: <6 U/L (ref 0–55)
AST (SGOT): 15 U/L (ref 5–34)
Albumin/Globulin Ratio: 0.7 — ABNORMAL LOW (ref 0.9–2.2)
Albumin: 1.9 g/dL — ABNORMAL LOW (ref 3.5–5.0)
Alkaline Phosphatase: 97 U/L (ref 37–106)
Anion Gap: 7 (ref 5.0–15.0)
BUN: 16 mg/dL (ref 7.0–19.0)
Bilirubin, Total: 0.3 mg/dL (ref 0.2–1.2)
CO2: 30 mEq/L — ABNORMAL HIGH (ref 22–29)
Calcium: 7.7 mg/dL — ABNORMAL LOW (ref 8.5–10.5)
Chloride: 105 mEq/L (ref 100–111)
Creatinine: 3.4 mg/dL — ABNORMAL HIGH (ref 0.6–1.0)
Globulin: 2.9 g/dL (ref 2.0–3.6)
Glucose: 113 mg/dL — ABNORMAL HIGH (ref 70–100)
Potassium: 3.9 mEq/L (ref 3.5–5.1)
Protein, Total: 4.8 g/dL — ABNORMAL LOW (ref 6.0–8.3)
Sodium: 142 mEq/L (ref 136–145)

## 2019-05-25 LAB — CBC
Absolute NRBC: 0 10*3/uL (ref 0.00–0.00)
Hematocrit: 34.1 % — ABNORMAL LOW (ref 34.7–43.7)
Hgb: 10.2 g/dL — ABNORMAL LOW (ref 11.4–14.8)
MCH: 28.9 pg (ref 25.1–33.5)
MCHC: 29.9 g/dL — ABNORMAL LOW (ref 31.5–35.8)
MCV: 96.6 fL — ABNORMAL HIGH (ref 78.0–96.0)
MPV: 11.8 fL (ref 8.9–12.5)
Nucleated RBC: 0 /100 WBC (ref 0.0–0.0)
Platelets: 177 10*3/uL (ref 142–346)
RBC: 3.53 10*6/uL — ABNORMAL LOW (ref 3.90–5.10)
RDW: 13 % (ref 11–15)
WBC: 8.67 10*3/uL (ref 3.10–9.50)

## 2019-05-25 LAB — GLUCOSE WHOLE BLOOD - POCT
Whole Blood Glucose POCT: 132 mg/dL — ABNORMAL HIGH (ref 70–100)
Whole Blood Glucose POCT: 131 mg/dL — ABNORMAL HIGH (ref 70–100)
Whole Blood Glucose POCT: 136 mg/dL — ABNORMAL HIGH (ref 70–100)
Whole Blood Glucose POCT: 88 mg/dL (ref 70–100)

## 2019-05-25 LAB — HEMOLYSIS INDEX: Hemolysis Index: 2 (ref 0–18)

## 2019-05-25 LAB — GFR: EGFR: 16.4

## 2019-05-25 NOTE — Plan of Care (Signed)
Problem: Moderate/High Fall Risk Score >5  Goal: Patient will remain free of falls  Outcome: Progressing  Flowsheets (Taken 05/25/2019 1755)  High (Greater than 13):   HIGH-Visual cue at entrance to patient's room   HIGH-Bed alarm on at all times while patient in bed   HIGH-Apply yellow "Fall Risk" arm band   HIGH-Initiate use of floor mats as appropriate   HIGH-Consider use of low bed     Problem: Safety  Goal: Patient will be free from injury during hospitalization  Outcome: Progressing  Flowsheets (Taken 05/25/2019 1755)  Patient will be free from injury during hospitalization:   Assess patient's risk for falls and implement fall prevention plan of care per policy   Provide and maintain safe environment   Use appropriate transfer methods   Ensure appropriate safety devices are available at the bedside   Include patient/ family/ care giver in decisions related to safety   Hourly rounding   Assess for patients risk for elopement and implement Elopement Risk Plan per policy  Goal: Patient will be free from infection during hospitalization  Outcome: Progressing  Flowsheets (Taken 05/25/2019 1755)  Free from Infection during hospitalization:   Assess and monitor for signs and symptoms of infection   Monitor lab/diagnostic results   Monitor all insertion sites (i.e. indwelling lines, tubes, urinary catheters, and drains)   Encourage patient and family to use good hand hygiene technique     Problem: Pain  Goal: Pain at adequate level as identified by patient  Outcome: Progressing  Flowsheets (Taken 05/25/2019 1755)  Pain at adequate level as identified by patient:   Assess pain on admission, during daily assessment and/or before any "as needed" intervention(s)   Evaluate if patient comfort function goal is met   Include patient/patient care companion in decisions related to pain management as needed     Problem: Psychosocial and Spiritual Needs  Goal: Demonstrates ability to cope with hospitalization/illness  Outcome:  Progressing  Flowsheets (Taken 05/25/2019 1755)  Demonstrates ability to cope with hospitalizations/illness:   Encourage verbalization of feelings/concerns/expectations   Provide quiet environment   Reinforce positive adaptation of new coping behaviors   Include patient/ patient care companion in decisions     Problem: Compromised Tissue integrity  Goal: Nutritional status is improving  Outcome: Progressing  Flowsheets (Taken 05/25/2019 1755)  Nutritional status is improving: Include patient/patient care companion in decisions related to nutrition     Problem: Renal Instability  Goal: Fluid and electrolyte balance are achieved/maintained  Outcome: Progressing  Flowsheets (Taken 05/25/2019 1755)  Fluid and electrolyte balance are achieved/maintained:   Monitor intake and output every shift   Monitor/assess lab values and report abnormal values   Provide adequate hydration   Assess and reassess fluid and electrolyte status  Goal: Nutritional intake is adequate  Outcome: Progressing  Flowsheets (Taken 05/25/2019 1755)  Nutritional intake is adequate:   Assess anorexia, appetite, and amount of meal/food tolerated   Include patient/patient care companion in decisions related to nutrition     Problem: Altered GI Function  Goal: Nutritional intake is adequate  Outcome: Progressing  Flowsheets (Taken 05/25/2019 1755)  Nutritional intake is adequate:   Assess anorexia, appetite, and amount of meal/food tolerated   Include patient/patient care companion in decisions related to nutrition  Goal: Mobility/Activity is maintained at optimal level for patient  Outcome: Progressing  Flowsheets (Taken 05/25/2019 1755)  Mobility/activity is maintained at optimal level for patient:   Increase mobility as tolerated/progressive mobility   Encourage independent activity  per ability   Maintain proper body alignment   Plan activities to conserve energy, plan rest periods     Problem: Impaired Mobility  Goal: Mobility/Activity is maintained at optimal  level for patient  Outcome: Progressing  Flowsheets (Taken 05/25/2019 1755)  Mobility/activity is maintained at optimal level for patient:   Increase mobility as tolerated/progressive mobility   Encourage independent activity per ability   Maintain proper body alignment   Plan activities to conserve energy, plan rest periods      Patient is alert and oriented x4. Patient denies chest pain and shortness of breath.  Patient is ambulatory to the bathroom. 3 BM noted during the shift. Patient's safety was maintained during the shift. Patient continues  on tele. Patient vital signs and lab work were monitored and communicated to Doctor. Doctor responded and explained plan of care with patient at bed side. Patient was educated on all her medications and precautions to be taken. No distress noted during the shift.

## 2019-05-25 NOTE — Discharge Summary (Signed)
[facility]      Patient: [name]  Admission Date: 05/12/2019   DOB: 29-Jan-1955  Discharge Date: 05/25/2019    MRN: [id number]  Discharge Attending:Corderius Saraceni E Antonio Creswell     Referring Physician: No primary care provider on file.  PCP: No primary care provider on file.       DISCHARGE SUMMARY     Discharge Information   Admission Diagnosis:   Peritonitis    Discharge Diagnosis:   Active Hospital Problems    Diagnosis    Peritonitis    Upper GI bleed    ESRD (end stage renal disease) on dialysis    Iron deficiency anemia secondary to inadequate dietary iron intake    Sideropenic dysphagia    Peritoneal dialysis catheter dysfunction, initial encounter        Admission Condition: serious  Discharge Condition: Stable   Consultants: GI, Nephrology   Functional Status: Ambulatory   Discharged to: SNF     Discharge Medications:     Medication List      START taking these medications    amLODIPine 5 MG tablet  Commonly known as: NORVASC  Take 1 tablet (5 mg total) by mouth every 12 (twelve) hours     insulin glargine 100 UNIT/ML injection  Commonly known as: LANTUS  Inject 15 Units into the skin nightly     pantoprazole 40 MG tablet  Commonly known as: PROTONIX  Take 1 tablet (40 mg total) by mouth 2 (two) times daily        CONTINUE taking these medications    atorvastatin 40 MG tablet  Commonly known as: LIPITOR     carvedilol 25 MG tablet  Commonly known as: COREG     famotidine 40 MG tablet  Commonly known as: PEPCID     furosemide 40 MG tablet  Commonly known as: LASIX     gabapentin 600 MG tablet  Commonly known as: NEURONTIN     insulin aspart 100 UNIT/ML injection  Commonly known as: NovoLOG     oxyCODONE-acetaminophen 5-325 MG per tablet  Commonly known as: PERCOCET     sevelamer 800 MG tablet  Commonly known as: RENVELA        STOP taking these medications    amLODIPine-atorvastatin 10-10 MG per tablet  Commonly known as: CADUET     aspirin EC 81 MG EC tablet     cilostazol 100 MG tablet  Commonly  known as: PLETAL        ASK your doctor about these medications    ceFAZolin 2 g in dextrose 5 % 50 mL IVPB  Infuse 2 g into the vein once for 1 dose  Ask about: Should I take this medication?           Where to Get Your Medications      Information about where to get these medications is not yet available    Ask your nurse or doctor about these medications   amLODIPine 5 MG tablet   ceFAZolin 2 g in dextrose 5 % 50 mL IVPB   insulin glargine 100 UNIT/ML injection   pantoprazole 40 MG tablet             Hospital Course   Presentation History   Per Dr Ann Maki "[name] is a 64 y.o. female with history of hypertension, hyperlipidemia, insulin-dependent diabetes mellitus, COPD, end-stage renal disease on peritoneal dialysis who presents to the emergency department for evaluation of a 3-day history of intractable nausea,  vomiting and nonbloody diarrhea.  She states that she ate an ultimate omelette at [facility] on 05/09/19 and following that her symptoms started.  She complains of constant diffuse abdominal pain described as aching pain, reaching 10 out of 10 in severity which is exacerbated by movement.  She also reports a fever 102 last night along with chills, feeling dizzy and near syncope, generalized weakness. She reports no history of renal stones.  She does report having a UTI in the past.  She states that she had noted blood in one of her episodes of emesis.  She states that she was hospitalized 1 month ago at [facility] where she had an upper endoscopy but she cannot recall the results.  States that she was started on hemodialysis last May 2020 and after that she was transitioned to peritoneal dialysis at home.  She states that she intermittently makes urine and last week she had frequent urination.  She reports no headache, visual changes, syncope, loss of consciousness, hematochezia, melena, dysuria, hematuria, numbness/tingling, focal weakness.   In the ED, patient was hypertensive on  presentation, afebrile, not tachycardic or hypoxic.  Labs significant for glucose of 551, creatinine 4.7.  She received 3 L normal saline bolus and vancomycin and Zosyn in the ED    See HPI for details.    Hospital Course (13 Days)   [name] a 64 y.o.femalewith history ofhypertension, hyperlipidemia, insulin-dependent diabetes mellitus, COPD, end-stage renal disease on peritoneal dialysiswho presents to the emergency department for evaluation of a 3-day history of intractable nausea,vomiting and nonbloody diarrhea, admitted for viral gastroenteritis and UTI.  She was found to have peritonitis and started on IV antibiotics with dialysis. Patient's discharge is delayed until outpatient dialysis is arranged.     By problem:  #UGIB - Patient had 1 episode of 400 cc coffee ground emesis on 4/20 per RN  #Severe esophagitis  -s/p EGD on 4/21: Grade D gastritis  -Continue Protonix 40 mg BID  -Ok to restart ASA and pletal per GI however was no longer on asa/pletal per patient since Aug 2020 due to GI bleeding  -H/H stable   -GI recommendations appreciated. Outpatient follow-up for repeat EGD.    Chest pain  -EKG NSR, troponin x2 negative.  -D dimer elevated, CTA chest negative for PE.     #Ecoli Peritonitis, pansensitive   #Abdominal pain  #Nausea, vomiting, diarrhea - Resolved  #UTI  -Likelydue toviral gastroenteritis/UTI/Peritonitis  -Peritoneal fluid with 51,000 WBC's. Improved to 200s   -Blood cultures NGTD, lactic acid normal and lipase normal  -Urine culture: >100,000 CFU/ML of multiple bacterial morphotypes   -CT abd/pelvis wo contrastrevealsno evidence of bowel obstruction, bowel ischemia, perforation, diverticulitis, pancreatitis, nephrolithiasis and shows intraperitoneal fluid  -PRN antiemetics for nausea/vomiting  -Continue IP cefazolin with HD-- for total 3 weeks  -ID recommendations appreciated    #Hypokalemia  #Hypomagnesemia  -Resolved     #ESRD on peritoneal dialysis--> HD  -Started  on dialysis on 05/2018  -CT abd/pelvis shows moderate amount of ascites ? Dialysis fluid  -Under care everywhere, noted that Cr from 10/2018-11/2018 has been 3.8-5 range  -patient switching to HD per request  -s/p  tunneled catheter placement.   -Nephrology following    #Hypertension  -Continue home amlodipine, coreg    #Hyperglycemia  #Type 2 DM  -Continue home lantus, SSI, ac/hs accuchecks  -HbA1c 9.8%    #Hyperlipidemia   -Continue home statin    #Iron deficiency anemia  #Anemia of Chronic disease   -  Completed IV Venofer 3/3  -S/p 1 unit pRBC transfusion     Delay in [place] due to pt awaiting placement for HD    Procedures/Imaging:   Tunneled Cath Placement (Permcath)    Result Date: 05/23/2019   Technically successful placement of an  23cm Equistream catheter via the right internal jugular vein as described above. Phlebitic changes in the right internal jugular vein from prior catheter placement. Dimitrios Papadouris, MD  05/23/2019 3:51 PM           Best Practices   Was the patient admitted with either a CHF Exacerbation or Pneumonia? No     Progress Note/Physical Exam at Discharge     Subjective: Pt seen and examined at bedside.   Feels well. No sob chest pain fever chills nausea/vomiting. Had some abdominal discomfort yesterday but resolved.    Vitals:    05/25/19 0500 05/25/19 0804 05/25/19 0901 05/25/19 1232   BP:  193/78 154/73 102/68   Pulse:  66 74 62   Resp:  17  17   Temp:  97.7 F (36.5 C)  97.3 F (36.3 C)   TempSrc:  Oral  Oral   SpO2:  99%  98%   Weight: 118 kg (260 lb 2.3 oz)      Height:           General: NAD, AAOx3, in bed  HEENT: perrla, eomi, sclera anicteric, OP: Clear, MMM  Neck: supple, FROM, no LAD  Cardiovascular: RRR, no m/r/g  Lungs: CTAB, no w/r/r  Abdomen: soft, +BS, NT/ND, no masses, no g/r  Extremities: no C/C/E  Skin: no rashes or lesions noted  Neuro: non focal, moves all extremities        Diagnostics     Labs/Studies Pending at Discharge: No    Last Labs   Recent Labs   Lab  05/25/19  0418 05/24/19  0337 05/23/19  0451   WBC 8.67 8.93 8.64   RBC 3.53* 3.32* 3.40*   Hgb 10.2* 9.9* 9.8*   Hematocrit 34.1* 32.2* 32.8*   MCV 96.6* 97.0* 96.5*   Platelets 177 212 225       Recent Labs   Lab 05/25/19  0418 05/24/19  0337 05/23/19  0451 05/22/19  0440 05/21/19  0425   Sodium 142 141 140 139 140   140   Potassium 3.9 3.7 3.7 4.4 4.5   4.6   Chloride 105 101 102 103 102   102   CO2 30* 31* 28 27 29   29    BUN 16.0 26.0* 24.0* 25.0* 28.0*   27.0*   Creatinine 3.4* 5.0* 4.3* 4.6* 4.6*   4.6*   Glucose 113* 253* 316* 342* 414*   415*   Calcium 7.7* 8.0* 7.9* 7.8* 7.9*   8.1*       Microbiology Results     Procedure Component Value Units Date/Time    Blood Culture Aerobic/Anaerobic #1 [[id number]] Collected: 05/12/19 1855    Specimen: Arm from Blood, Venipuncture Updated: 05/18/19 0021    Narrative:      ORDER#: [id number]                                    ORDERED BY: Dwyane Dee, VINOD  SOURCE: Blood, Venipuncture Arm                      COLLECTED:  05/12/19 18:55  ANTIBIOTICS AT COLL.:  RECEIVED :  05/12/19 21:53  Culture Blood Aerobic and Anaerobic        FINAL       05/18/19 00:21  05/18/19   No growth after 5 days of incubation.      Blood Culture Aerobic/Anaerobic #2 [[id number]] Collected: 05/12/19 1855    Specimen: Arm from Blood, Venipuncture Updated: 05/18/19 0021    Narrative:      ORDER#: [id number]                                    ORDERED BY: Dwyane Dee, VINOD  SOURCE: Blood, Venipuncture Arm                      COLLECTED:  05/12/19 18:55  ANTIBIOTICS AT COLL.:                                RECEIVED :  05/12/19 21:53  Culture Blood Aerobic and Anaerobic        FINAL       05/18/19 00:21  05/18/19   No growth after 5 days of incubation.      COVID-19 (SARS-COV-2) [employer] [facility]) [[id number]] Collected: 05/12/19 1611    Specimen: Nasopharyngeal Swab from Nasopharynx Updated: 05/12/19 1648     Purpose of COVID testing Screening     SARS-CoV-2 Specimen Source  Nasopharyngeal     SARS CoV 2 Overall Result Negative     Comment: Test performed using the Abbott ID NOW EUA assay.  Please see Fact Sheets for patients and providers located at:  http://[facility].info/  This test is for the qualitative detection of SARS-CoV-2  (COVID19) nucleic acid. Viral nucleic acids may persist in vivo,  independent of viability. Detection of viral nucleic acid does  not imply the presence of infectious virus, or that virus  nucleic acid is the cause of clinical symptoms. Negative  results should be treated as presumptive and, if inconsistent  with clinical signs and symptoms or necessary for patient  management, should be tested with an alternative molecular  assay. Negative results do not preclude SARS-CoV-2 infection  and should not be used as the sole basis for patient  management decisions. Invalid results may be due to inhibiting  substances in the specimen and recollection should occur.         Narrative:      o Collect and clearly label specimen type:  o Upper respiratory specimen: One Nasopharyngeal Dry Swab NO  Transport Media.  o Hand deliver to laboratory ASAP  Indication for testing->Extended care facility admission to  semi private room    Culture + Gram Lenise Arena, Body Fluid [[id number]] Collected: 05/13/19 1447    Specimen: Body Fluid from Ascites Fluid Updated: 05/15/19 1818    Narrative:      [id number] called Micro Results of fluid. Results read back [id number], by [id number]  on 05/15/2019 at 18:17  ORDER#: [id number]                                    ORDERED BY: RANA, IRMINDRA  SOURCE: Ascites Fluid ascites                        COLLECTED:  05/13/19 14:47  ANTIBIOTICS AT COLL.:  RECEIVED :  05/13/19 19:33  [id number]_ called Micro Results of fluid. Results read back [id number], by [id number] on 05/15/2019 at 18:17  Stain, Gram                                FINAL       05/13/19 21:48  05/13/19   Many WBCs             No organisms seen  Culture  and Gram Stain, Aerobic, Body FluidFINAL       05/15/19 18:18   +  05/15/19   Very light growth of Escherichia coli      _____________________________________________________________________________                                     E.coli       ANTIBIOTICS                     MIC  INTRP      _____________________________________________________________________________  Amoxicillin/CA                  8/4    S        Ampicillin                      >16    R        Ampicillin/sulbactam           16/8    I        Aztreonam                       <=2    S        Cefazolin                        2     S        Cefepime                        <=1    S        Cefoxitin                       <=4    S        Ceftazidime                     <=2    S        Ceftriaxone                     <=1    S        Cefuroxime                      <=4    S        Ciprofloxacin                 <=0.25   S        Ertapenem                     <=0.25   S        Gentamicin                      <=  2    S        Levofloxacin                   <=0.5   S        Meropenem                      <=0.5   S        Piperacillin/Tazobactam        <=2/4   S        Tetracycline                    >8     R        Trimethoprim/Sulfamethoxazole  >2/38   R        _____________________________________________________________________________            S=SUSCEPTIBLE     I=INTERMEDIATE     R=RESISTANT                            N/S=NON-SUSCEPTIBLE  _____________________________________________________________________________      Urine culture [[id number]] Collected: 05/12/19 1611    Specimen: Bladder Urine Updated: 05/14/19 0909    Narrative:      Replace urinary catheter prior to obtaining the urine culture  if it has been in place for greater than or equal to 14  days:->N/A No Foley  Indications for U/A Reflex to Micro - Reflex to  Culture:->Suprapubic Pain/Tenderness or Dysuria  ORDER#: [id number]                                    ORDERED BY: HAILE,  LYDIA  SOURCE: Urine                                        COLLECTED:  05/12/19 16:11  ANTIBIOTICS AT COLL.:                                RECEIVED :  05/12/19 16:26  Culture Urine                              FINAL       05/14/19 09:09  05/14/19   >100,000 CFU/ML of multiple bacterial morphotypes present.             Possible contamination, appropriate recollection is             requested if clinically indicated.             Patient Instructions   Discharge Diet: renal diet   Discharge Activity: as tolerated     Follow Up Appointment:  Follow-up Information     Ronie Spies, MD Follow up.    Specialties: Gastroenterology, Internal Medicine  Contact information:  [address]  [place] [address]  [phone]             Ernst Breach, MD Follow up in 1 week(s).    Specialties: Infectious Disease, Internal Medicine  Contact information:  [place]  [place] [address]  [phone]  Time spent examining patient, discussing with patient/family regarding hospital course, chart review, reconciling medications and discharge planning: 45 minutes.    Lavone Nian Hamilton Marinello    3:02 PM 05/25/2019

## 2019-05-25 NOTE — Progress Notes (Signed)
[place]     Pt declines SNF. Stated that dtr can supervise at home. Dtr is requesting a note so she can go into pt's senior apartment building to help pt. CM sent email for [facility] liaison to arrange OP HD services at [facility].       DCP- home with OP HD and HH PT/OT.             Dede Query, RN MSN       Clinical Case Manager       [facility]        [address] [place], [place] [address]      [email or web address]      [phone]

## 2019-05-25 NOTE — Plan of Care (Signed)
Patient AO X 4 , VSS no complain of pain. Pt on RA, no SOB, denies chest pain, no fever, nausea or vomiting noted during this shift. 24 hr tele monitoring in place, noted with NSR on the monitor. Acucheck done, insulin administered as ordered.  Pt. requested  hot pack for sore shoulder.  All medications given as ordered. Fall bundle in place, encouraged patient to call for assistance as needed.   Purposeful rounding done.    Problem: Impaired Mobility  Goal: Mobility/Activity is maintained at optimal level for patient  Flowsheets (Taken 05/25/2019 0020)  Mobility/activity is maintained at optimal level for patient:   Increase mobility as tolerated/progressive mobility   Encourage independent activity per ability   Maintain proper body alignment   Perform active/passive ROM   Plan activities to conserve energy, plan rest periods     Problem: Altered GI Function  Goal: Mobility/Activity is maintained at optimal level for patient  Flowsheets (Taken 05/25/2019 0020)  Mobility/activity is maintained at optimal level for patient:   Increase mobility as tolerated/progressive mobility   Encourage independent activity per ability   Maintain proper body alignment   Perform active/passive ROM   Plan activities to conserve energy, plan rest periods     Problem: Safety  Goal: Patient will be free from infection during hospitalization  Flowsheets (Taken 05/25/2019 0021)  Free from Infection during hospitalization:   Assess and monitor for signs and symptoms of infection   Monitor lab/diagnostic results   Monitor all insertion sites (i.e. indwelling lines, tubes, urinary catheters, and drains)   Encourage patient and family to use good hand hygiene technique

## 2019-05-25 NOTE — Progress Notes (Signed)
[facility] PROGRESS NOTE  Aaron Edelman, x [phone] [facility] Spectralink)      Date Time: 05/25/19 12:05 PM  Patient Name: [name]  Attending Physician: Dolphus Jenny, MD    CC: follow-up ESRD      Assessment:     1. PD associated peritonitis - E Coli   - cell count improving   - no need to remove catheter  2. ESRD- on CAPD switching to IHD  3. Anemia- CKD + Fe deficiency now exacerbated by GI bleed  4. Volume overload: improving slowly with PD  5. Hypokalemia  6. Hypomagnesemia - resolved  7. Flow issues with permacath resolved with cath flow asnd treatment completed       Recommendations:   - HD  Monday   - will keep PD catheter in place  - will request CM to settup outpatient HD at [place];- will need transportation should not discharge until arranged     Case discussed with:  Pt,   [place], MD   [facility]  [phone] (office)  X [phone] ([place])      Subjective:no abdominal pain  She feels burned out doing PD and has no help at home     Review of Systems:   Cardio: no chest pain, no edema  Pulm: no SOB        Physical Exam:     Vitals:    05/25/19 0418 05/25/19 0500 05/25/19 0804 05/25/19 0901   BP: 165/84  193/78 154/73   Pulse: 71  66 74   Resp: 18  17    Temp: 98.6 F (37 C)  97.7 F (36.5 C)    TempSrc: Oral  Oral    SpO2: 99%  99%    Weight:  118 kg (260 lb 2.3 oz)     Height:           Intake and Output Summary (Last 24 hours) at Date Time    Intake/Output Summary (Last 24 hours) at 05/25/2019 1205  Last data filed at 05/25/2019 0500  Gross per 24 hour   Intake 610 ml   Output 1950 ml   Net -1340 ml       General: obese female,comfortable   Cardiovascular: regular rate and rhythm  Lungs: clear to auscultation bilaterally, bilateral air entry,normal work of breathing  Abdomen: soft, NT,ND;LLQ PD catheter  Extremities: improved edema      Meds:      Scheduled Meds: PRN Meds:    amLODIPine, 5 mg, Oral, Q12H  atorvastatin, 40 mg, Oral,  Daily  carvedilol, 25 mg, Oral, Q12H SCH  darbepoetin alfa, 60 mcg, Subcutaneous, Weekly  famotidine, 10 mg, Oral, Q12H SCH  insulin glargine, 10 Units, Subcutaneous, Q12H SCH  insulin lispro, 1-4 Units, Subcutaneous, QHS  insulin lispro, 1-8 Units, Subcutaneous, TID AC  insulin lispro, 3 Units, Subcutaneous, TID MEALS  lactulose, 30 g, Oral, Q8H SCH  pantoprazole, 40 mg, Oral, BID  sevelamer, 800 mg, Oral, TID MEALS          Continuous Infusions:   sodium chloride 100 mL/hr at 05/14/19 1828    lactated ringers Stopped (05/15/19 1409)    sodium chloride, , PRN  acetaminophen, 650 mg, Q6H PRN  bisacodyl, 10 mg, Daily PRN  dextrose, 15 g of glucose, PRN   And  dextrose, 12.5 g, PRN   And  glucagon (rDNA), 1 mg, PRN  naloxone, 0.2 mg, PRN  ondansetron, 4 mg, Q6H PRN   Or  ondansetron, 4 mg, Q6H PRN              Labs:     Recent Labs   Lab 05/25/19  0418 05/24/19  0337 05/23/19  0451   WBC 8.67 8.93 8.64   Hgb 10.2* 9.9* 9.8*   Hematocrit 34.1* 32.2* 32.8*   Platelets 177 212 225     Recent Labs   Lab 05/25/19  0418 05/24/19  0337 05/23/19  0451   Sodium 142 141 140   Potassium 3.9 3.7 3.7   Chloride 105 101 102   CO2 30* 31* 28   BUN 16.0 26.0* 24.0*   Creatinine 3.4* 5.0* 4.3*   Calcium 7.7* 8.0* 7.9*   Albumin 1.9* 1.8* 1.7*   Glucose 113* 253* 316*   EGFR 16.4 10.5 12.5           Signed by: Laural Golden, MD

## 2019-05-26 ENCOUNTER — Encounter: Payer: Self-pay | Admitting: Interventional Radiology and Diagnostic Radiology

## 2019-05-26 LAB — CBC
Absolute NRBC: 0 10*3/uL (ref 0.00–0.00)
Hematocrit: 31.1 % — ABNORMAL LOW (ref 34.7–43.7)
Hgb: 9.5 g/dL — ABNORMAL LOW (ref 11.4–14.8)
MCH: 29.7 pg (ref 25.1–33.5)
MCHC: 30.5 g/dL — ABNORMAL LOW (ref 31.5–35.8)
MCV: 97.2 fL — ABNORMAL HIGH (ref 78.0–96.0)
MPV: 12 fL (ref 8.9–12.5)
Nucleated RBC: 0 /100 WBC (ref 0.0–0.0)
Platelets: 157 10*3/uL (ref 142–346)
RBC: 3.2 10*6/uL — ABNORMAL LOW (ref 3.90–5.10)
RDW: 13 % (ref 11–15)
WBC: 11.52 10*3/uL — ABNORMAL HIGH (ref 3.10–9.50)

## 2019-05-26 LAB — COMPREHENSIVE METABOLIC PANEL
ALT: <6 U/L (ref 0–55)
AST (SGOT): 13 U/L (ref 5–34)
Albumin/Globulin Ratio: 0.7 — ABNORMAL LOW (ref 0.9–2.2)
Albumin: 1.9 g/dL — ABNORMAL LOW (ref 3.5–5.0)
Alkaline Phosphatase: 92 U/L (ref 37–106)
Anion Gap: 9 (ref 5.0–15.0)
BUN: 23 mg/dL — ABNORMAL HIGH (ref 7.0–19.0)
Bilirubin, Total: 0.5 mg/dL (ref 0.2–1.2)
CO2: 27 mEq/L (ref 22–29)
Calcium: 7.5 mg/dL — ABNORMAL LOW (ref 8.5–10.5)
Chloride: 105 mEq/L (ref 100–111)
Creatinine: 4 mg/dL — ABNORMAL HIGH (ref 0.6–1.0)
Globulin: 2.8 g/dL (ref 2.0–3.6)
Glucose: 127 mg/dL — ABNORMAL HIGH (ref 70–100)
Potassium: 3.8 mEq/L (ref 3.5–5.1)
Protein, Total: 4.7 g/dL — ABNORMAL LOW (ref 6.0–8.3)
Sodium: 141 mEq/L (ref 136–145)

## 2019-05-26 LAB — GLUCOSE WHOLE BLOOD - POCT
Whole Blood Glucose POCT: 119 mg/dL — ABNORMAL HIGH (ref 70–100)
Whole Blood Glucose POCT: 166 mg/dL — ABNORMAL HIGH (ref 70–100)
Whole Blood Glucose POCT: 133 mg/dL — ABNORMAL HIGH (ref 70–100)

## 2019-05-26 LAB — HEMOLYSIS INDEX: Hemolysis Index: 2 (ref 0–18)

## 2019-05-26 LAB — GFR: EGFR: 13.6

## 2019-05-26 MED ORDER — INSULIN GLARGINE 100 UNIT/ML SC SOLN
10.00 [IU] | Freq: Every morning | SUBCUTANEOUS | Status: DC
Start: 2019-05-27 — End: 2019-06-29

## 2019-05-26 MED ORDER — CEFAZOLIN SODIUM 1 G IJ SOLR
1.00 g | INTRAMUSCULAR | Status: DC
Start: 2019-05-26 — End: 2019-05-26
  Filled 2019-05-26: qty 1000

## 2019-05-26 MED ORDER — SODIUM CHLORIDE 0.9 % IV BOLUS
100.0000 mL | INTRAVENOUS | Status: AC | PRN
Start: 2019-05-26 — End: 2019-05-26

## 2019-05-26 MED ORDER — INSULIN GLARGINE 100 UNIT/ML SC SOLN
10.00 [IU] | Freq: Every morning | SUBCUTANEOUS | Status: DC
Start: 2019-05-27 — End: 2019-05-26

## 2019-05-26 MED ORDER — SODIUM CHLORIDE 0.9 % IV BOLUS
250.0000 mL | INTRAVENOUS | Status: AC | PRN
Start: 2019-05-26 — End: 2019-05-26

## 2019-05-26 NOTE — Discharge Summary (Signed)
[facility]      Patient: [name]  Admission Date: 05/12/2019   DOB: Oct 17, 1955  Discharge Date: 05/26/2019    MRN: [id number]  Discharge Attending:Raelynne Ludwick D [place]     Referring Physician: No primary care provider on file.  PCP: No primary care provider on file.       DISCHARGE SUMMARY     Discharge Information   Admission Diagnosis:   Peritonitis    Discharge Diagnosis:   Active Hospital Problems    Diagnosis    Peritonitis    Upper GI bleed    ESRD (end stage renal disease) on dialysis    Iron deficiency anemia secondary to inadequate dietary iron intake    Sideropenic dysphagia    Peritoneal dialysis catheter dysfunction, initial encounter        Admission Condition: serious  Discharge Condition: Stable   Consultants: GI, Nephrology   Functional Status: Ambulatory   Discharged to: Home      Discharge Medications:     Medication List      START taking these medications    amLODIPine 5 MG tablet  Commonly known as: NORVASC  Take 1 tablet (5 mg total) by mouth every 12 (twelve) hours     insulin glargine 100 UNIT/ML injection  Commonly known as: LANTUS  Inject 15 Units into the skin nightly     pantoprazole 40 MG tablet  Commonly known as: PROTONIX  Take 1 tablet (40 mg total) by mouth 2 (two) times daily        CONTINUE taking these medications    atorvastatin 40 MG tablet  Commonly known as: LIPITOR     carvedilol 25 MG tablet  Commonly known as: COREG     famotidine 40 MG tablet  Commonly known as: PEPCID     furosemide 40 MG tablet  Commonly known as: LASIX     gabapentin 600 MG tablet  Commonly known as: NEURONTIN     insulin aspart 100 UNIT/ML injection  Commonly known as: NovoLOG     oxyCODONE-acetaminophen 5-325 MG per tablet  Commonly known as: PERCOCET     sevelamer 800 MG tablet  Commonly known as: RENVELA        STOP taking these medications    amLODIPine-atorvastatin 10-10 MG per tablet  Commonly known as: CADUET     aspirin EC 81 MG EC tablet     cilostazol 100 MG tablet  Commonly  known as: PLETAL        ASK your doctor about these medications    ceFAZolin 2 g in dextrose 5 % 50 mL IVPB  Infuse 2 g into the vein once for 1 dose  Ask about: Should I take this medication?           Where to Get Your Medications      Information about where to get these medications is not yet available    Ask your nurse or doctor about these medications   amLODIPine 5 MG tablet   ceFAZolin 2 g in dextrose 5 % 50 mL IVPB   insulin glargine 100 UNIT/ML injection   pantoprazole 40 MG tablet             Hospital Course   Presentation History   Per Dr Ann Maki "[name] is a 64 y.o. female with history of hypertension, hyperlipidemia, insulin-dependent diabetes mellitus, COPD, end-stage renal disease on peritoneal dialysis who presents to the emergency department for evaluation of a 3-day history of intractable  nausea, vomiting and nonbloody diarrhea.  She states that she ate an ultimate omelette at [facility] on 05/09/19 and following that her symptoms started.  She complains of constant diffuse abdominal pain described as aching pain, reaching 10 out of 10 in severity which is exacerbated by movement.  She also reports a fever 102 last night along with chills, feeling dizzy and near syncope, generalized weakness. She reports no history of renal stones.  She does report having a UTI in the past.  She states that she had noted blood in one of her episodes of emesis.  She states that she was hospitalized 1 month ago at [facility] where she had an upper endoscopy but she cannot recall the results.  States that she was started on hemodialysis last May 2020 and after that she was transitioned to peritoneal dialysis at home.  She states that she intermittently makes urine and last week she had frequent urination.  She reports no headache, visual changes, syncope, loss of consciousness, hematochezia, melena, dysuria, hematuria, numbness/tingling, focal weakness.   In the ED, patient was hypertensive on  presentation, afebrile, not tachycardic or hypoxic.  Labs significant for glucose of 551, creatinine 4.7.  She received 3 L normal saline bolus and vancomycin and Zosyn in the ED    See HPI for details.    Hospital Course (14 Days)   [name] a 64 y.o.femalewith history ofhypertension, hyperlipidemia, insulin-dependent diabetes mellitus, COPD, end-stage renal disease on peritoneal dialysiswho presents to the emergency department for evaluation of a 3-day history of intractable nausea,vomiting and nonbloody diarrhea, admitted for viral gastroenteritis and UTI.  She was found to have peritonitis and started on IV antibiotics with dialysis. Delay in [place] due to awaiting placement for SNF. Pt then voiced that she wanted to be transitioned from PD to HD. She had a tunneled dialysis cathter placed on 4/30. She was cleared for [facility], she then voiced that she would like to now return home as she is feeling stronger and does not need rehab.     #UGIB - Patient had 1 episode of 400 cc coffee ground emesis on 4/20 per RN  #Severe esophagitis  -s/p EGD on 4/21: Grade D gastritis  -Continue Protonix 40 mg BID  -Ok to restart ASA and pletal per GI however was no longer on asa/pletal per patient since Aug 2020 due to GI bleeding  -H/H stable   -GI recommendations appreciated. Outpatient follow-up for repeat EGD.    Chest pain  -EKG NSR, troponin x2 negative.  -D dimer elevated, CTA chest negative for PE.     #Ecoli Peritonitis, pansensitive   #Abdominal pain  #Nausea, vomiting, diarrhea - Resolved  #UTI  -Likelydue toviral gastroenteritis/UTI/Peritonitis  -Peritoneal fluid with 51,000 WBC's. Improved to 200s   -Blood cultures NGTD, lactic acid normal and lipase normal  -Urine culture: >100,000 CFU/ML of multiple bacterial morphotypes   -CT abd/pelvis wo contrastrevealsno evidence of bowel obstruction, bowel ischemia, perforation, diverticulitis, pancreatitis, nephrolithiasis and shows intraperitoneal fluid  -PRN  antiemetics for nausea/vomiting  -Continue IP cefazolin with HD-- for total 3 weeks  -ID recommendations appreciated    #Hypokalemia  #Hypomagnesemia  -Resolved     #ESRD on peritoneal dialysis--> HD  -Started on dialysis on 05/2018  -CT abd/pelvis shows moderate amount of ascites ? Dialysis fluid  -Under care everywhere, noted that Cr from 10/2018-11/2018 has been 3.8-5 range  -patient switching to HD per request  -s/p  tunneled catheter placement.   -Nephrology following    #  Hypertension  -Continue home amlodipine, coreg    #Hyperglycemia  #Type 2 DM  -Continue home lantus, SSI, ac/hs accuchecks  -HbA1c 9.8%    #Hyperlipidemia   -Continue home statin    #Iron deficiency anemia  #Anemia of Chronic disease   -Completed IV Venofer 3/3  -S/p 1 unit pRBC transfusion     Delay in [place] due to pt awaiting placement for HD    Procedures/Imaging:   Tunneled Cath Placement (Permcath)    Result Date: 05/23/2019   Technically successful placement of an  23cm Equistream catheter via the right internal jugular vein as described above. Phlebitic changes in the right internal jugular vein from prior catheter placement. Dimitrios Papadouris, MD  05/23/2019 3:51 PM           Best Practices   Was the patient admitted with either a CHF Exacerbation or Pneumonia? No     Progress Note/Physical Exam at Discharge     Subjective: Pt seen and examined at bedside.   Feels well. Denies cp, sob. Reports abdominal pain overnight, this has improved at this time. Denies nausea/vomiting. Feels ready to go home.     Vitals:    05/26/19 0500 05/26/19 0745 05/26/19 0846 05/26/19 1140   BP:  129/75 129/75 131/71   Pulse:  76 76 69   Resp:  16  18   Temp:  99.3 F (37.4 C)  98.8 F (37.1 C)   TempSrc:  Oral  Oral   SpO2:  96%  95%   Weight: 120.7 kg (266 lb 1.5 oz)      Height:           General: NAD, AAOx3, in bed  HEENT: perrla, eomi, sclera anicteric, OP: Clear, MMM  Neck: supple, FROM, no LAD  Cardiovascular: RRR, no m/r/g  Lungs: CTAB, no  w/r/r  Abdomen: soft, +BS, NT/ND, no masses, no g/r  Extremities: no C/C/E  Skin: no rashes or lesions noted  Neuro: non focal, moves all extremities        Diagnostics     Labs/Studies Pending at Discharge: No    Last Labs   Recent Labs   Lab 05/26/19  0315 05/25/19  0418 05/24/19  0337   WBC 11.52* 8.67 8.93   RBC 3.20* 3.53* 3.32*   Hgb 9.5* 10.2* 9.9*   Hematocrit 31.1* 34.1* 32.2*   MCV 97.2* 96.6* 97.0*   Platelets 157 177 212       Recent Labs   Lab 05/26/19  0315 05/25/19  0418 05/24/19  0337 05/23/19  0451 05/22/19  0440   Sodium 141 142 141 140 139   Potassium 3.8 3.9 3.7 3.7 4.4   Chloride 105 105 101 102 103   CO2 27 30* 31* 28 27   BUN 23.0* 16.0 26.0* 24.0* 25.0*   Creatinine 4.0* 3.4* 5.0* 4.3* 4.6*   Glucose 127* 113* 253* 316* 342*   Calcium 7.5* 7.7* 8.0* 7.9* 7.8*       Microbiology Results     Procedure Component Value Units Date/Time    Blood Culture Aerobic/Anaerobic #1 [[id number]] Collected: 05/12/19 1855    Specimen: Arm from Blood, Venipuncture Updated: 05/18/19 0021    Narrative:      ORDER#: [id number]                                    ORDERED BY: Madalyn Rob  SOURCE: Blood, Venipuncture Arm  COLLECTED:  05/12/19 18:55  ANTIBIOTICS AT COLL.:                                RECEIVED :  05/12/19 21:53  Culture Blood Aerobic and Anaerobic        FINAL       05/18/19 00:21  05/18/19   No growth after 5 days of incubation.      Blood Culture Aerobic/Anaerobic #2 [[id number]] Collected: 05/12/19 1855    Specimen: Arm from Blood, Venipuncture Updated: 05/18/19 0021    Narrative:      ORDER#: [id number]                                    ORDERED BY: Dwyane Dee, VINOD  SOURCE: Blood, Venipuncture Arm                      COLLECTED:  05/12/19 18:55  ANTIBIOTICS AT COLL.:                                RECEIVED :  05/12/19 21:53  Culture Blood Aerobic and Anaerobic        FINAL       05/18/19 00:21  05/18/19   No growth after 5 days of incubation.      COVID-19 (SARS-COV-2) [facility])  [[id number]] Collected: 05/12/19 1611    Specimen: Nasopharyngeal Swab from Nasopharynx Updated: 05/12/19 1648     Purpose of COVID testing Screening     SARS-CoV-2 Specimen Source Nasopharyngeal     SARS CoV 2 Overall Result Negative     Comment: Test performed using the Abbott ID NOW EUA assay.  Please see Fact Sheets for patients and providers located at:  http://olson-hall.info/  This test is for the qualitative detection of SARS-CoV-2  (COVID19) nucleic acid. Viral nucleic acids may persist in vivo,  independent of viability. Detection of viral nucleic acid does  not imply the presence of infectious virus, or that virus  nucleic acid is the cause of clinical symptoms. Negative  results should be treated as presumptive and, if inconsistent  with clinical signs and symptoms or necessary for patient  management, should be tested with an alternative molecular  assay. Negative results do not preclude SARS-CoV-2 infection  and should not be used as the sole basis for patient  management decisions. Invalid results may be due to inhibiting  substances in the specimen and recollection should occur.         Narrative:      o Collect and clearly label specimen type:  o Upper respiratory specimen: One Nasopharyngeal Dry Swab NO  Transport Media.  o Hand deliver to laboratory ASAP  Indication for testing->Extended care facility admission to  semi private room    Culture + Gram Lenise Arena, Body Fluid [[id number]] Collected: 05/13/19 1447    Specimen: Body Fluid from Ascites Fluid Updated: 05/15/19 1818    Narrative:      [id number]_ called Micro Results of fluid. Results read back [id number], by [id number]  on 05/15/2019 at 18:17  ORDER#: [id number]                                    ORDERED BY: Evonnie Dawes  SOURCE: Ascites Fluid ascites                        COLLECTED:  05/13/19 14:47  ANTIBIOTICS AT COLL.:                                RECEIVED :  05/13/19 19:33  [id number]_ called Micro Results of fluid. Results read  back [id number], by [id number] on 05/15/2019 at 18:17  Stain, Gram                                FINAL       05/13/19 21:48  05/13/19   Many WBCs             No organisms seen  Culture and Gram Stain, Aerobic, Body FluidFINAL       05/15/19 18:18   +  05/15/19   Very light growth of Escherichia coli      _____________________________________________________________________________                                     E.coli       ANTIBIOTICS                     MIC  INTRP      _____________________________________________________________________________  Amoxicillin/CA                  8/4    S        Ampicillin                      >16    R        Ampicillin/sulbactam           16/8    I        Aztreonam                       <=2    S        Cefazolin                        2     S        Cefepime                        <=1    S        Cefoxitin                       <=4    S        Ceftazidime                     <=2    S        Ceftriaxone                     <=1    S        Cefuroxime                      <=4    S        Ciprofloxacin                 <=  0.25   S        Ertapenem                     <=0.25   S        Gentamicin                      <=2    S        Levofloxacin                   <=0.5   S        Meropenem                      <=0.5   S        Piperacillin/Tazobactam        <=2/4   S        Tetracycline                    >8     R        Trimethoprim/Sulfamethoxazole  >2/38   R        _____________________________________________________________________________            S=SUSCEPTIBLE     I=INTERMEDIATE     R=RESISTANT                            N/S=NON-SUSCEPTIBLE  _____________________________________________________________________________      Urine culture [[id number]] Collected: 05/12/19 1611    Specimen: Bladder Urine Updated: 05/14/19 0909    Narrative:      Replace urinary catheter prior to obtaining the urine culture  if it has been in place for greater than or equal to 14  days:->N/A No  Foley  Indications for U/A Reflex to Micro - Reflex to  Culture:->Suprapubic Pain/Tenderness or Dysuria  ORDER#: [id number]                                    ORDERED BY: HAILE, LYDIA  SOURCE: Urine                                        COLLECTED:  05/12/19 16:11  ANTIBIOTICS AT COLL.:                                RECEIVED :  05/12/19 16:26  Culture Urine                              FINAL       05/14/19 09:09  05/14/19   >100,000 CFU/ML of multiple bacterial morphotypes present.             Possible contamination, appropriate recollection is             requested if clinically indicated.             Patient Instructions   Discharge Diet: renal diet   Discharge Activity: as tolerated     Follow Up Appointment:  Follow-up Information     Ronie Spies, MD Follow up.    Specialties: Gastroenterology, Internal Medicine  Contact information:  [address]  [place] [address]  [phone]             Ernst Breach, MD Follow up in 1 week(s).    Specialties: Infectious Disease, Internal Medicine  Contact information:  [place] [address]  [phone]                    Time spent examining patient, discussing with patient/family regarding hospital course, chart review, reconciling medications and discharge planning: 45 minutes.    Keitha Butte Nyree Yonker    11:57 AM 05/26/2019

## 2019-05-26 NOTE — Progress Notes (Addendum)
Writer spoke with pt by phone, introduced self, explained my role.  Pt indicates that her home address on facesheet is incorrect - pt provided the following updated address.   [address] [place], [place]    Pt was asked about OP HD clinic choice, pt indicates [facility] [address] is close to her. Writer advised that neph would like to continue to follow at [facility], pt agrees with that location and advises that she will use [facility] transportation. Pt has MWF schedule preference.  Referral sent.     Addendum @ 1:15pm - Probation officer spoke with Kristi at [facility], pt is currently established as a PD patient at their clinic, clinic will offer pt TTS 8am to start and move her to a MWF when chair becomes avail.  Pt is O to start tomorrow. Instructions have been added to AVS.       Freida Busman, HD Care Coordinator  [phone]

## 2019-05-26 NOTE — Plan of Care (Signed)
Problem: Renal Instability  Goal: Fluid and electrolyte balance are achieved/maintained  Flowsheets (Taken 05/25/2019 1755 by Barton Dubois, RN)  Fluid and electrolyte balance are achieved/maintained:   Monitor intake and output every shift   Monitor/assess lab values and report abnormal values   Provide adequate hydration   Assess and reassess fluid and electrolyte status     Problem: Renal Instability  Goal: Free from infection  Flowsheets (Taken 05/24/2019 1744 by Mickel Baas, RN)  Free from infection: Monitor/assess for signs and symptoms of infection     Problem: Patient Receiving Advanced Renal Therapies  Goal: Therapy access site remains intact  Flowsheets (Taken 05/24/2019 1744 by Mickel Baas, RN)  Therapy access site remains intact:   Assess therapy access site   Change therapy access site dressing as needed

## 2019-05-26 NOTE — Progress Notes (Signed)
Discharge note: IV removed from [place] w/o difficulty. AVS reviewed with patient. All questions answered. Pt instructed on how to follow-up with HD and MDs after discharge. Pt transported with all of her personal belongings via wheelchair to front lobby with daughter providing transportation home. PD cath and permcath clean, dry and intact dressings.

## 2019-05-26 NOTE — Plan of Care (Signed)
Patient AO X 4, VSS. complained of pain in her stomach medicated with prn pain medication. Pt on RA, no SOB, denies chest pain , no fever, nausea and vomiting during this shift.  Tele monitoring in place, noted with NSR on the monitor. Acucheck done, insulin was not administered, BS 88, pt. refused  to take snack.   Bowel movement X 2 . Fall bundle in place, encouraged patient to call for assistance as needed.   Purposeful rounding done.      Problem: Safety  Goal: Patient will be free from injury during hospitalization  Flowsheets (Taken 05/26/2019 0406)  Patient will be free from injury during hospitalization:   Assess patient's risk for falls and implement fall prevention plan of care per policy   Provide and maintain safe environment   Use appropriate transfer methods   Ensure appropriate safety devices are available at the bedside   Include patient/ family/ care giver in decisions related to safety   Hourly rounding     Problem: Altered GI Function  Goal: Elimination patterns are normal or improving  Flowsheets (Taken 05/26/2019 0407)  Elimination patterns are normal or improving:   Report abnormal assessment to physician   Anticipate/assist with toileting needs   Assess for normal bowel sounds   Monitor for abdominal distension   Monitor for abdominal discomfort  Goal: Mobility/Activity is maintained at optimal level for patient  Flowsheets (Taken 05/26/2019 0409)  Mobility/activity is maintained at optimal level for patient:   Increase mobility as tolerated/progressive mobility   Encourage independent activity per ability   Maintain proper body alignment   Perform active/passive ROM   Plan activities to conserve energy, plan rest periods     Problem: Impaired Mobility  Goal: Mobility/Activity is maintained at optimal level for patient  Flowsheets (Taken 05/26/2019 0409)  Mobility/activity is maintained at optimal level for patient:   Increase mobility as tolerated/progressive mobility   Encourage independent activity  per ability   Maintain proper body alignment   Perform active/passive ROM   Plan activities to conserve energy, plan rest periods

## 2019-05-26 NOTE — Progress Notes (Signed)
E-mail referral from weekend hosp CC, [place] - pt new to dialysis, OP HD to be arranged at [facility].  Writer to reach out to pt to discuss and move forward with request.     Freida Busman, HD Care Coordinator  [phone]

## 2019-05-26 NOTE — Progress Notes (Addendum)
[facility] PROGRESS NOTE  Aaron Edelman, x [phone] [facility] Spectralink)      Date Time: 05/26/19 1:24 PM  Patient Name: [name]  Attending Physician: Ardeth Sportsman, MD    CC: follow-up ESRD  HD note      Assessment:     1. PD associated peritonitis - E Coli  - cell count improving, on Cefazolin  - transitioned to HD this admission per pt preference  2. ESRD- on CAPD switching to IHD  3. Anemia- CKD + Fe deficiency now exacerbated by GI bleed  4. Volume overload: improving  5. Hypokalemia      Recommendations:   1. Seen on HD  2. Stable for d/c after HD today - [facility] T2 shift , plan for Cefazolin 2 g with HD until May 19. Pt can start there this Thursday   3. Pt states she wants PD catheter removed as OP as she does not plan on returning to PD in the future      Case discussed with:  Pt, HD nurse      Corky Mull, MD   [facility]  [phone] (office)  X [phone] ([place])      Subjective: seen on HD  No c/o    Review of Systems:   Cardio: no chest pain, no edema  Pulm: no SOB        Physical Exam:     Vitals:    05/26/19 1140 05/26/19 1226 05/26/19 1307 05/26/19 1318   BP: 131/71  142/67 154/75   Pulse: 69 71 70 68   Resp: 18  18    Temp: 98.8 F (37.1 C)  97.9 F (36.6 C)    TempSrc: Oral      SpO2: 95%      Weight:       Height:           Intake and Output Summary (Last 24 hours) at Date Time    Intake/Output Summary (Last 24 hours) at 05/26/2019 1324  Last data filed at 05/26/2019 0500  Gross per 24 hour   Intake 610 ml   Output 400 ml   Net 210 ml       General: obese female,comfortable   Cardiovascular: regular rate and rhythm  Lungs: clear to auscultation bilaterally, bilateral air entry,normal work of breathing  Abdomen: soft, NT,ND;LLQ PD catheter  Extremities: improved edema      Meds:      Scheduled Meds: PRN Meds:    amLODIPine, 5 mg, Oral, Q12H  atorvastatin, 40 mg, Oral, Daily  carvedilol, 25 mg, Oral, Q12H SCH  darbepoetin alfa, 60 mcg,  Subcutaneous, Weekly  famotidine, 10 mg, Oral, Q12H [place]  [START ON 05/27/2019] insulin glargine, 10 Units, Subcutaneous, QAM  insulin lispro, 1-4 Units, Subcutaneous, QHS  insulin lispro, 1-8 Units, Subcutaneous, TID AC  insulin lispro, 3 Units, Subcutaneous, TID MEALS  lactulose, 30 g, Oral, Q8H SCH  pantoprazole, 40 mg, Oral, BID  sevelamer, 800 mg, Oral, TID MEALS          Continuous Infusions:   sodium chloride 100 mL/hr at 05/14/19 1828    lactated ringers Stopped (05/15/19 1409)    sodium chloride, , PRN  acetaminophen, 650 mg, Q6H PRN  bisacodyl, 10 mg, Daily PRN  dextrose, 15 g of glucose, PRN   And  dextrose, 12.5 g, PRN   And  glucagon (rDNA), 1 mg, PRN  naloxone, 0.2 mg, PRN  ondansetron, 4 mg, Q6H PRN  Or  ondansetron, 4 mg, Q6H PRN  sodium chloride, 100 mL, Q1H PRN  sodium chloride, 250 mL, PRN              Labs:     Recent Labs   Lab 05/26/19  0315 05/25/19  0418 05/24/19  0337   WBC 11.52* 8.67 8.93   Hgb 9.5* 10.2* 9.9*   Hematocrit 31.1* 34.1* 32.2*   Platelets 157 177 212     Recent Labs   Lab 05/26/19  0315 05/25/19  0418 05/24/19  0337   Sodium 141 142 141   Potassium 3.8 3.9 3.7   Chloride 105 105 101   CO2 27 30* 31*   BUN 23.0* 16.0 26.0*   Creatinine 4.0* 3.4* 5.0*   Calcium 7.5* 7.7* 8.0*   Albumin 1.9* 1.9* 1.8*   Glucose 127* 113* 253*   EGFR 13.6 16.4 10.5           Signed by: Corky Mull, MD

## 2019-05-26 NOTE — Progress Notes (Signed)
Call from [facility], clinic has to move pt's TTS 8am chair time to TTS 12:30 with start date still set for Thurs 5/5.  Writer asked LIsa in acutes to communicate change to patient.  AVS instructions have been updated.      Freida Busman, HD Care Coordinator  [phone]

## 2019-05-26 NOTE — Progress Notes (Addendum)
Per [facility] note, OP HD is not available to start until Thursday 5/5 at 12:30 pm. Will f/u with nephrology if pt has to be dialyzed one more time in the hospital before her OP HD session on Thursday.              Dede Query, RN MSN       Clinical Case Manager       [facility]        [address], [place], [place] [address]      [email or web address]      [phone]

## 2019-05-26 NOTE — Progress Notes (Signed)
UF  Of 1 liter as ordered. Pt. Alert and oriented x3. Lungs clear. No c/o sob or pain. Telemetry SR. Dressing to right permcath dry and intact. For discharge home and to start outpt. dialyisis on Thursday 05-29-19 at 1230. Pt. Aware of time change. Report given to Dell Ponto RN     05/26/19 1645   Treatment Summary   Time Off Machine 1649   Duration of Treatment (Hours) 3.5   Dialyzer Clearance Moderately streaked   Fluid Volume Off (mL) 2500   Prime Volume (mL) 200   Rinseback Volume (mL) 200   Fluid Given: Normal Saline (mL) 0   Fluid Given: PRBC  0 mL   Fluid Given: Albumin (mL) 0   Fluid Given: Other (mL) 0   Total Fluid Given 400   Hemodialysis Net Fluid Removed 2100   Post Treatment Assessment   Post-Treatment Weight (Kg) 118.9   Patient Response to Treatment tolerated well   Permacath/Temporary Catheter 05/23/19 Permacath Right   Placement Date/Time: 05/23/19 1342   Inserted by: Papadouris  Access Type: Permacath  Orientation: Right  Access Location: Tunneled, Chest  Catheter Tip Location: svc  Central Line Infection Prevention Education provided?: Yes;No  Hand Hygiene: [place]...   Catheter Lumen Volume Venous 1.7 mL   Catheter Lumen Volume Arterial 1.7 mL   Dressing Status and Intervention Dressing Intact;Clean & Dry   [place] Caps on Catheter Yes   Vitals   Temp 97.9 F (36.6 C)   Heart Rate 62   Resp Rate 20   BP 154/60   Assessment   Mental Status Alert;Oriented   Cardiac (WDL) WDL   Cardiac Regularity Regular   Cardiac Symptoms None   Cardiac Rhythm Normal Sinus Rhythm   Respiratory  WDL   Respiratory Pattern Regular;Easy   Bilateral Breath Sounds Clear;Diminished   Edema  X   RLE Edema Non Pitting Edema   LLE Edema Non Pitting Edema   General Skin Color Appropriate for ethnicity   Skin Condition/Temp Warm   Gastrointestinal (WDL) WDL   Abdomen Inspection Soft;Rounded   GI Symptoms None   Mobility Ambulatory with Assistance   Pain Assessment   Charting Type Reassessment   Pain Scale Used Numeric Scale  (0-10)   Numeric Pain Scale   Pain Score 0   POSS Score 1   Education   Person taught Patient   Knowledge basis Minimal   Topics taught Access care;Procedure;Other   Teaching Tools Explain   [name] Understanding   Bedside Nurse Communication   Name of bedside RN - post dialysis   [facility] RN)

## 2019-05-26 NOTE — Progress Notes (Addendum)
OP HD PLACEMENT CONFIRMED for patient at [facility], TTS 8am with start date planned for Tues 5/4.  Instructions have been added to AVS.     Addendum @ 1:45pm - start date @ OP clinic pushed to Thurs 5/5. AVS instructions have been updated.     Freida Busman, HD Care Coordinator  [phone]

## 2019-05-26 NOTE — Progress Notes (Signed)
Arrived via bed. Alert and orientedd x3. Lungs clear. No c/o sob. Generalized non pitting edema noted. Dressing to right permcath dry and intact. Ports aspirate well. Telemetry SR. C/o abdominal achiness. Stated " I had 4 loose bm's this morning". For possible discharge today. Report received from Spencer     05/26/19 1307   Dialysis Weight   Pre-Treatment Weight (Kg) 120   Scale Type ICU Bed Scale   Vitals   Temp 97.9 F (36.6 C)   Heart Rate 70   Resp Rate 18   BP 142/67   Assessment   Mental Status Alert;Oriented   Cardiac (WDL) WDL   Cardiac Regularity Regular   Cardiac Symptoms None   Cardiac Rhythm Normal Sinus Rhythm   Respiratory  WDL   Respiratory Pattern Regular;Easy   Bilateral Breath Sounds Clear;Diminished   Edema  X   Generalized Edema Non Pitting Edema   RLE Edema Non Pitting Edema   LLE Edema Non Pitting Edema   General Skin Color Appropriate for ethnicity   Skin Condition/Temp Warm   Gastrointestinal (WDL) X   Abdomen Inspection Soft;Rounded   GI Symptoms Diarrhea   Mobility Ambulatory with Assistance   Permacath/Temporary Catheter 05/23/19 Permacath Right   Placement Date/Time: 05/23/19 1342   Inserted by: Papadouris  Access Type: Permacath  Orientation: Right  Access Location: Tunneled, Chest  Catheter Tip Location: svc  Central Line Infection Prevention Education provided?: Yes;No  Hand Hygiene: [place]...   Dressing Status and Intervention Dressing Intact;Clean & Dry   Pain Assessment   Charting Type Assessment   Pain Scale Used Numeric Scale (0-10)   Numeric Pain Scale   Pain Score 8   POSS Score 1   Pain Location Abdomen   Pain Descriptors Aching   Pain Frequency Intermittent   Hemodialysis Comments   Pre-Hemodialysis Comments   (TIMEOUT DONE)

## 2019-05-27 ENCOUNTER — Observation Stay: Payer: 59 | Admitting: Anesthesiology

## 2019-05-27 ENCOUNTER — Encounter (INDEPENDENT_AMBULATORY_CARE_PROVIDER_SITE_OTHER): Payer: Self-pay

## 2019-05-27 ENCOUNTER — Encounter
Admission: EM | Disposition: A | Discharge status: Home Health | Payer: Self-pay | Source: Home / Self Care | Attending: Internal Medicine

## 2019-05-27 ENCOUNTER — Emergency Department: Payer: 59

## 2019-05-27 ENCOUNTER — Inpatient Hospital Stay
Admission: EM | Admit: 2019-05-27 | Discharge: 2019-06-13 | DRG: 466 | Disposition: A | Discharge status: Home Health | Payer: 59 | Attending: Internal Medicine | Admitting: Internal Medicine

## 2019-05-27 DIAGNOSIS — E785 Hyperlipidemia, unspecified: Secondary | ICD-10-CM | POA: Diagnosis present

## 2019-05-27 DIAGNOSIS — R1084 Generalized abdominal pain: Secondary | ICD-10-CM

## 2019-05-27 DIAGNOSIS — E119 Type 2 diabetes mellitus without complications: Secondary | ICD-10-CM

## 2019-05-27 DIAGNOSIS — K659 Peritonitis, unspecified: Secondary | ICD-10-CM | POA: Diagnosis present

## 2019-05-27 DIAGNOSIS — R109 Unspecified abdominal pain: Secondary | ICD-10-CM

## 2019-05-27 DIAGNOSIS — J449 Chronic obstructive pulmonary disease, unspecified: Secondary | ICD-10-CM | POA: Diagnosis present

## 2019-05-27 DIAGNOSIS — K56600 Partial intestinal obstruction, unspecified as to cause: Secondary | ICD-10-CM | POA: Diagnosis present

## 2019-05-27 DIAGNOSIS — Z79899 Other long term (current) drug therapy: Secondary | ICD-10-CM

## 2019-05-27 DIAGNOSIS — R159 Full incontinence of feces: Secondary | ICD-10-CM | POA: Diagnosis present

## 2019-05-27 DIAGNOSIS — I120 Hypertensive chronic kidney disease with stage 5 chronic kidney disease or end stage renal disease: Secondary | ICD-10-CM | POA: Diagnosis present

## 2019-05-27 DIAGNOSIS — K219 Gastro-esophageal reflux disease without esophagitis: Secondary | ICD-10-CM | POA: Diagnosis present

## 2019-05-27 DIAGNOSIS — K660 Peritoneal adhesions (postprocedural) (postinfection): Secondary | ICD-10-CM | POA: Diagnosis present

## 2019-05-27 DIAGNOSIS — Z6841 Body Mass Index (BMI) 40.0 and over, adult: Secondary | ICD-10-CM

## 2019-05-27 DIAGNOSIS — Z794 Long term (current) use of insulin: Secondary | ICD-10-CM

## 2019-05-27 DIAGNOSIS — Y812 Prosthetic and other implants, materials and accessory general- and plastic-surgery devices associated with adverse incidents: Secondary | ICD-10-CM | POA: Diagnosis present

## 2019-05-27 DIAGNOSIS — I959 Hypotension, unspecified: Secondary | ICD-10-CM | POA: Diagnosis present

## 2019-05-27 DIAGNOSIS — Z20822 Contact with and (suspected) exposure to covid-19: Secondary | ICD-10-CM | POA: Diagnosis present

## 2019-05-27 DIAGNOSIS — Z833 Family history of diabetes mellitus: Secondary | ICD-10-CM

## 2019-05-27 DIAGNOSIS — N186 End stage renal disease: Secondary | ICD-10-CM | POA: Diagnosis present

## 2019-05-27 DIAGNOSIS — Y838 Other surgical procedures as the cause of abnormal reaction of the patient, or of later complication, without mention of misadventure at the time of the procedure: Secondary | ICD-10-CM | POA: Diagnosis present

## 2019-05-27 DIAGNOSIS — Z992 Dependence on renal dialysis: Secondary | ICD-10-CM

## 2019-05-27 DIAGNOSIS — D501 Sideropenic dysphagia: Secondary | ICD-10-CM | POA: Diagnosis present

## 2019-05-27 DIAGNOSIS — E669 Obesity, unspecified: Secondary | ICD-10-CM | POA: Diagnosis present

## 2019-05-27 DIAGNOSIS — B965 Pseudomonas (aeruginosa) (mallei) (pseudomallei) as the cause of diseases classified elsewhere: Secondary | ICD-10-CM | POA: Diagnosis present

## 2019-05-27 DIAGNOSIS — D696 Thrombocytopenia, unspecified: Secondary | ICD-10-CM | POA: Diagnosis present

## 2019-05-27 DIAGNOSIS — E1165 Type 2 diabetes mellitus with hyperglycemia: Secondary | ICD-10-CM | POA: Diagnosis present

## 2019-05-27 DIAGNOSIS — N2581 Secondary hyperparathyroidism of renal origin: Secondary | ICD-10-CM | POA: Diagnosis present

## 2019-05-27 DIAGNOSIS — T85611A Breakdown (mechanical) of intraperitoneal dialysis catheter, initial encounter: Secondary | ICD-10-CM | POA: Diagnosis present

## 2019-05-27 DIAGNOSIS — I951 Orthostatic hypotension: Secondary | ICD-10-CM | POA: Diagnosis present

## 2019-05-27 DIAGNOSIS — D631 Anemia in chronic kidney disease: Secondary | ICD-10-CM | POA: Diagnosis present

## 2019-05-27 DIAGNOSIS — G4730 Sleep apnea, unspecified: Secondary | ICD-10-CM | POA: Diagnosis present

## 2019-05-27 DIAGNOSIS — E1122 Type 2 diabetes mellitus with diabetic chronic kidney disease: Secondary | ICD-10-CM | POA: Diagnosis present

## 2019-05-27 DIAGNOSIS — Z66 Do not resuscitate: Secondary | ICD-10-CM | POA: Diagnosis present

## 2019-05-27 DIAGNOSIS — Z8249 Family history of ischemic heart disease and other diseases of the circulatory system: Secondary | ICD-10-CM

## 2019-05-27 DIAGNOSIS — R1110 Vomiting, unspecified: Secondary | ICD-10-CM

## 2019-05-27 DIAGNOSIS — T8571XA Infection and inflammatory reaction due to peritoneal dialysis catheter, initial encounter: Principal | ICD-10-CM | POA: Diagnosis present

## 2019-05-27 HISTORY — PX: EXPLORATORY LAPAROTOMY: SHX4079

## 2019-05-27 HISTORY — PX: REMOVAL, FOREIGN BODY: SHX5112

## 2019-05-27 LAB — CBC AND DIFFERENTIAL
Absolute NRBC: 0.02 10*3/uL — ABNORMAL HIGH (ref 0.00–0.00)
Basophils Absolute Automated: 0.04 10*3/uL (ref 0.00–0.08)
Basophils Automated: 0.3 %
Eosinophils Absolute Automated: 0.03 10*3/uL (ref 0.00–0.44)
Eosinophils Automated: 0.2 %
Hematocrit: 34.1 % — ABNORMAL LOW (ref 34.7–43.7)
Hgb: 10.4 g/dL — ABNORMAL LOW (ref 11.4–14.8)
Immature Granulocytes Absolute: 0.08 10*3/uL — ABNORMAL HIGH (ref 0.00–0.07)
Immature Granulocytes: 0.5 %
Lymphocytes Absolute Automated: 0.88 10*3/uL (ref 0.42–3.22)
Lymphocytes Automated: 5.8 %
MCH: 29.4 pg (ref 25.1–33.5)
MCHC: 30.5 g/dL — ABNORMAL LOW (ref 31.5–35.8)
MCV: 96.3 fL — ABNORMAL HIGH (ref 78.0–96.0)
MPV: 11.6 fL (ref 8.9–12.5)
Monocytes Absolute Automated: 0.98 10*3/uL — ABNORMAL HIGH (ref 0.21–0.85)
Monocytes: 6.4 %
Neutrophils Absolute: 13.21 10*3/uL — ABNORMAL HIGH (ref 1.10–6.33)
Neutrophils: 86.8 %
Nucleated RBC: 0.1 /100 WBC — ABNORMAL HIGH (ref 0.0–0.0)
Platelets: 165 10*3/uL (ref 142–346)
RBC: 3.54 10*6/uL — ABNORMAL LOW (ref 3.90–5.10)
RDW: 13 % (ref 11–15)
WBC: 15.22 10*3/uL — ABNORMAL HIGH (ref 3.10–9.50)

## 2019-05-27 LAB — ECG 12-LEAD
Atrial Rate: 68 {beats}/min
P Axis: 80 degrees
P-R Interval: 146 ms
Q-T Interval: 430 ms
QRS Duration: 74 ms
QTC Calculation (Bezet): 457 ms
R Axis: 46 degrees
T Axis: 21 degrees
Ventricular Rate: 68 {beats}/min

## 2019-05-27 LAB — COVID-19 (SARS-COV-2): SARS CoV 2 Overall Result: NEGATIVE

## 2019-05-27 LAB — GLUCOSE WHOLE BLOOD - POCT
Whole Blood Glucose POCT: 164 mg/dL — ABNORMAL HIGH (ref 70–100)
Whole Blood Glucose POCT: 170 mg/dL — ABNORMAL HIGH (ref 70–100)
Whole Blood Glucose POCT: 142 mg/dL — ABNORMAL HIGH (ref 70–100)
Whole Blood Glucose POCT: 143 mg/dL — ABNORMAL HIGH (ref 70–100)
Whole Blood Glucose POCT: 135 mg/dL — ABNORMAL HIGH (ref 70–100)

## 2019-05-27 LAB — COMPREHENSIVE METABOLIC PANEL
ALT: <6 U/L (ref 0–55)
AST (SGOT): 9 U/L (ref 5–34)
Albumin/Globulin Ratio: 0.6 — ABNORMAL LOW (ref 0.9–2.2)
Albumin: 2.1 g/dL — ABNORMAL LOW (ref 3.5–5.0)
Alkaline Phosphatase: 108 U/L — ABNORMAL HIGH (ref 37–106)
Anion Gap: 8 (ref 5.0–15.0)
BUN: 13 mg/dL (ref 7.0–19.0)
Bilirubin, Total: 1 mg/dL (ref 0.2–1.2)
CO2: 28 mEq/L (ref 22–29)
Calcium: 7.8 mg/dL — ABNORMAL LOW (ref 8.5–10.5)
Chloride: 103 mEq/L (ref 100–111)
Creatinine: 3 mg/dL — ABNORMAL HIGH (ref 0.6–1.0)
Globulin: 3.3 g/dL (ref 2.0–3.6)
Glucose: 155 mg/dL — ABNORMAL HIGH (ref 70–100)
Potassium: 4.1 mEq/L (ref 3.5–5.1)
Protein, Total: 5.4 g/dL — ABNORMAL LOW (ref 6.0–8.3)
Sodium: 139 mEq/L (ref 136–145)

## 2019-05-27 LAB — GFR: EGFR: 19

## 2019-05-27 LAB — LIPASE: Lipase: 21 U/L (ref 8–78)

## 2019-05-27 LAB — HEMOLYSIS INDEX: Hemolysis Index: 1 (ref 0–18)

## 2019-05-27 LAB — BODY FLUID CELL COUNT

## 2019-05-27 LAB — LACTIC ACID, PLASMA: Lactic Acid: 1.1 mmol/L (ref 0.2–2.0)

## 2019-05-27 SURGERY — REMOVAL, FOREIGN BODY
Anesthesia: Anesthesia General | Site: Abdomen | Wound class: Dirty or Infected

## 2019-05-27 MED ORDER — MORPHINE SULFATE 10 MG/ML IJ/IV SOLN (WRAP)
6.0000 mg | Freq: Once | Status: DC
Start: 2019-05-27 — End: 2019-05-27
  Filled 2019-05-27: qty 1

## 2019-05-27 MED ORDER — PROPOFOL 10 MG/ML IV EMUL (WRAP)
INTRAVENOUS | Status: DC | PRN
Start: 2019-05-27 — End: 2019-05-28
  Administered 2019-05-27: 40 mg via INTRAVENOUS
  Administered 2019-05-27: 30 mg via INTRAVENOUS
  Administered 2019-05-27: 80 mg via INTRAVENOUS

## 2019-05-27 MED ORDER — FENTANYL CITRATE (PF) 50 MCG/ML IJ SOLN (WRAP)
INTRAMUSCULAR | Status: DC | PRN
Start: 2019-05-27 — End: 2019-05-28
  Administered 2019-05-27 – 2019-05-28 (×4): 25 ug via INTRAVENOUS

## 2019-05-27 MED ORDER — EPHEDRINE SULFATE 50 MG/ML IJ/IV SOLN (WRAP)
Status: AC
Start: 2019-05-27 — End: ?
  Filled 2019-05-27: qty 1

## 2019-05-27 MED ORDER — SODIUM CHLORIDE 0.9 % IV SOLN
INTRAVENOUS | Status: DC | PRN
Start: 2019-05-27 — End: 2019-05-28

## 2019-05-27 MED ORDER — PANTOPRAZOLE SODIUM 40 MG PO TBEC
40.00 mg | DELAYED_RELEASE_TABLET | Freq: Every morning | ORAL | Status: DC
Start: 2019-05-27 — End: 2019-06-13
  Administered 2019-05-27 – 2019-06-13 (×18): 40 mg via ORAL
  Filled 2019-05-27 (×18): qty 1

## 2019-05-27 MED ORDER — FAMOTIDINE 20 MG PO TABS
10.0000 mg | ORAL_TABLET | Freq: Two times a day (BID) | ORAL | Status: DC
Start: 2019-05-27 — End: 2019-05-27

## 2019-05-27 MED ORDER — ONDANSETRON 4 MG PO TBDP
4.00 mg | ORAL_TABLET | Freq: Four times a day (QID) | ORAL | Status: DC | PRN
Start: 2019-05-27 — End: 2019-06-13

## 2019-05-27 MED ORDER — ACETAMINOPHEN 325 MG PO TABS
650.0000 mg | ORAL_TABLET | Freq: Four times a day (QID) | ORAL | Status: DC | PRN
Start: 2019-05-27 — End: 2019-05-30
  Administered 2019-05-28: 650 mg via ORAL
  Filled 2019-05-27: qty 2

## 2019-05-27 MED ORDER — DEXTROSE 50 % IV SOLN
12.50 g | INTRAVENOUS | Status: DC | PRN
Start: 2019-05-27 — End: 2019-06-13

## 2019-05-27 MED ORDER — INSULIN LISPRO 100 UNIT/ML SC SOLN
1.00 [IU] | Freq: Three times a day (TID) | SUBCUTANEOUS | Status: DC
Start: 2019-05-27 — End: 2019-06-13
  Administered 2019-05-28 (×2): 1 [IU] via SUBCUTANEOUS
  Administered 2019-06-02 – 2019-06-03 (×2): 3 [IU] via SUBCUTANEOUS
  Administered 2019-06-03: 17:00:00 1 [IU] via SUBCUTANEOUS
  Administered 2019-06-04: 09:00:00 5 [IU] via SUBCUTANEOUS
  Administered 2019-06-04: 13:00:00 3 [IU] via SUBCUTANEOUS
  Administered 2019-06-05: 08:00:00 5 [IU] via SUBCUTANEOUS
  Administered 2019-06-05 – 2019-06-06 (×2): 3 [IU] via SUBCUTANEOUS
  Administered 2019-06-06 (×2): 5 [IU] via SUBCUTANEOUS
  Administered 2019-06-07 – 2019-06-08 (×5): 3 [IU] via SUBCUTANEOUS
  Administered 2019-06-09: 09:00:00 5 [IU] via SUBCUTANEOUS
  Administered 2019-06-09: 18:00:00 3 [IU] via SUBCUTANEOUS
  Administered 2019-06-10: 09:00:00 5 [IU] via SUBCUTANEOUS
  Administered 2019-06-10: 17:00:00 1 [IU] via SUBCUTANEOUS
  Administered 2019-06-11: 09:00:00 5 [IU] via SUBCUTANEOUS
  Administered 2019-06-11 – 2019-06-13 (×4): 3 [IU] via SUBCUTANEOUS
  Filled 2019-05-27 (×4): qty 9
  Filled 2019-05-27: qty 3
  Filled 2019-05-27: qty 9
  Filled 2019-05-27: qty 15
  Filled 2019-05-27: qty 9
  Filled 2019-05-27: qty 3
  Filled 2019-05-27 (×5): qty 9
  Filled 2019-05-27: qty 15
  Filled 2019-05-27: qty 3
  Filled 2019-05-27: qty 15
  Filled 2019-05-27: qty 9
  Filled 2019-05-27: qty 15
  Filled 2019-05-27 (×4): qty 9
  Filled 2019-05-27 (×2): qty 15

## 2019-05-27 MED ORDER — ONDANSETRON HCL 4 MG/2ML IJ SOLN
4.00 mg | Freq: Once | INTRAMUSCULAR | Status: AC
Start: 2019-05-27 — End: 2019-05-27
  Administered 2019-05-27: 05:00:00 4 mg via INTRAVENOUS
  Filled 2019-05-27: qty 2

## 2019-05-27 MED ORDER — VANCOMYCIN ORAL SOLUTION 50 MG/ML UNIT DOSE
125.00 mg | Freq: Four times a day (QID) | ORAL | Status: DC
Start: 2019-05-27 — End: 2019-06-11
  Administered 2019-05-27 – 2019-06-11 (×59): 125 mg via ORAL
  Filled 2019-05-27 (×42): qty 2.5

## 2019-05-27 MED ORDER — INSULIN LISPRO 100 UNIT/ML SC SOLN
3.00 [IU] | Freq: Three times a day (TID) | SUBCUTANEOUS | Status: DC
Start: 2019-05-27 — End: 2019-05-27

## 2019-05-27 MED ORDER — GLUCOSE 40 % PO GEL
15.00 g | ORAL | Status: DC | PRN
Start: 2019-05-27 — End: 2019-06-13

## 2019-05-27 MED ORDER — NALOXONE HCL 0.4 MG/ML IJ SOLN (WRAP)
0.20 mg | INTRAMUSCULAR | Status: DC | PRN
Start: 2019-05-27 — End: 2019-06-13

## 2019-05-27 MED ORDER — LIDOCAINE HCL 2 % IJ SOLN
INTRAMUSCULAR | Status: DC | PRN
Start: 2019-05-27 — End: 2019-05-28
  Administered 2019-05-27: 60 mg

## 2019-05-27 MED ORDER — GLUCAGON 1 MG IJ SOLR (WRAP)
1.00 mg | INTRAMUSCULAR | Status: DC | PRN
Start: 2019-05-27 — End: 2019-06-13

## 2019-05-27 MED ORDER — MORPHINE SULFATE 4 MG/ML IJ/IV SOLN (WRAP)
4.0000 mg | Freq: Once | Status: AC
Start: 2019-05-27 — End: 2019-05-27
  Administered 2019-05-27: 06:00:00 4 mg via INTRAVENOUS
  Filled 2019-05-27: qty 1

## 2019-05-27 MED ORDER — SODIUM CHLORIDE 0.9 % IV SOLN
INTRAVENOUS | Status: DC
Start: 2019-05-27 — End: 2019-06-13

## 2019-05-27 MED ORDER — INSULIN GLARGINE 100 UNIT/ML SC SOLN
10.00 [IU] | Freq: Every morning | SUBCUTANEOUS | Status: DC
Start: 2019-05-28 — End: 2019-06-01
  Administered 2019-05-28 – 2019-06-01 (×5): 10 [IU] via SUBCUTANEOUS
  Filled 2019-05-27 (×5): qty 10

## 2019-05-27 MED ORDER — BUPIVACAINE HCL (PF) 0.25 % IJ SOLN
INTRAMUSCULAR | Status: DC | PRN
Start: 2019-05-27 — End: 2019-05-28
  Administered 2019-05-27: 20 mL

## 2019-05-27 MED ORDER — EPHEDRINE SULFATE 50 MG/ML IJ/IV SOLN (WRAP)
Status: DC | PRN
Start: 2019-05-27 — End: 2019-05-28
  Administered 2019-05-27 (×2): 10 mg via INTRAVENOUS

## 2019-05-27 MED ORDER — CEFEPIME HCL 1 G IJ SOLR
1.00 g | Freq: Once | INTRAMUSCULAR | Status: AC
Start: 2019-05-27 — End: 2019-05-27
  Administered 2019-05-27: 06:00:00 1 g via INTRAVENOUS
  Filled 2019-05-27: qty 1000

## 2019-05-27 MED ORDER — SEVELAMER CARBONATE 800 MG PO TABS
800.0000 mg | ORAL_TABLET | Freq: Three times a day (TID) | ORAL | Status: DC
Start: 2019-05-27 — End: 2019-06-13
  Administered 2019-05-27 – 2019-06-13 (×52): 800 mg via ORAL
  Filled 2019-05-27 (×51): qty 1

## 2019-05-27 MED ORDER — LACTULOSE 10 GM/15ML PO SOLN
30.00 g | Freq: Three times a day (TID) | ORAL | Status: DC
Start: 2019-05-27 — End: 2019-05-27

## 2019-05-27 MED ORDER — INSULIN GLARGINE 100 UNIT/ML SC SOLN
10.00 [IU] | Freq: Every morning | SUBCUTANEOUS | Status: DC
Start: 2019-05-27 — End: 2019-05-27

## 2019-05-27 MED ORDER — DEXTROSE 5 % IV SOLN
2.00 g | Freq: Every day | INTRAVENOUS | Status: DC
Start: 2019-05-27 — End: 2019-05-29
  Administered 2019-05-27 – 2019-05-28 (×2): 2 g via INTRAVENOUS
  Filled 2019-05-27 (×4): qty 2000

## 2019-05-27 MED ORDER — FENTANYL CITRATE (PF) 50 MCG/ML IJ SOLN (WRAP)
INTRAMUSCULAR | Status: AC
Start: 2019-05-27 — End: ?
  Filled 2019-05-27: qty 2

## 2019-05-27 MED ORDER — INSULIN LISPRO 100 UNIT/ML SC SOLN
1.00 [IU] | Freq: Every evening | SUBCUTANEOUS | Status: DC
Start: 2019-05-27 — End: 2019-05-27

## 2019-05-27 MED ORDER — LACTATED RINGERS IV SOLN
INTRAVENOUS | Status: DC
Start: 2019-05-27 — End: 2019-06-13

## 2019-05-27 MED ORDER — SODIUM CHLORIDE 0.9 % IV SOLN
INTRAVENOUS | Status: DC | PRN
Start: 2019-05-27 — End: 2019-06-13

## 2019-05-27 MED ORDER — MORPHINE SULFATE 4 MG/ML IJ/IV SOLN (WRAP)
3.0000 mg | Status: DC | PRN
Start: 2019-05-27 — End: 2019-05-28
  Administered 2019-05-27 – 2019-05-28 (×4): 3 mg via INTRAVENOUS
  Filled 2019-05-27 (×4): qty 1

## 2019-05-27 MED ORDER — IODIXANOL 320 MG/ML IV SOLN
130.00 mL | Freq: Once | INTRAVENOUS | Status: AC | PRN
Start: 2019-05-27 — End: 2019-05-27
  Administered 2019-05-27: 130 mL via INTRAVENOUS

## 2019-05-27 MED ORDER — ONDANSETRON HCL 4 MG/2ML IJ SOLN
4.00 mg | Freq: Four times a day (QID) | INTRAMUSCULAR | Status: DC | PRN
Start: 2019-05-27 — End: 2019-06-13
  Filled 2019-05-27: qty 2

## 2019-05-27 MED ORDER — ATORVASTATIN CALCIUM 40 MG PO TABS
40.0000 mg | ORAL_TABLET | Freq: Every day | ORAL | Status: DC
Start: 2019-05-27 — End: 2019-06-13
  Administered 2019-05-27 – 2019-06-13 (×18): 40 mg via ORAL
  Filled 2019-05-27 (×18): qty 1

## 2019-05-27 MED ORDER — CARVEDILOL 12.5 MG PO TABS
25.0000 mg | ORAL_TABLET | Freq: Two times a day (BID) | ORAL | Status: DC
Start: 2019-05-27 — End: 2019-06-13
  Administered 2019-05-27 – 2019-06-13 (×26): 25 mg via ORAL
  Filled 2019-05-27 (×33): qty 2

## 2019-05-27 MED ORDER — SODIUM CHLORIDE 0.9 % IV BOLUS
500.00 mL | Freq: Once | INTRAVENOUS | Status: AC
Start: 2019-05-27 — End: 2019-05-27
  Administered 2019-05-27: 06:00:00 500 mL via INTRAVENOUS

## 2019-05-27 MED ORDER — CEFAZOLIN SODIUM 1 G IJ SOLR
1.00 g | INTRAMUSCULAR | Status: DC
Start: 2019-05-27 — End: 2019-05-27
  Filled 2019-05-27: qty 1000

## 2019-05-27 MED ORDER — ONDANSETRON HCL 4 MG/2ML IJ SOLN
INTRAMUSCULAR | Status: DC | PRN
Start: 2019-05-27 — End: 2019-05-28
  Administered 2019-05-27: 4 mg via INTRAVENOUS

## 2019-05-27 MED ORDER — PHENYLEPHRINE 100 MCG/ML IV BOLUS (ANESTHESIA)
PREFILLED_SYRINGE | INTRAVENOUS | Status: DC | PRN
Start: 2019-05-27 — End: 2019-05-28
  Administered 2019-05-27: 100 ug via INTRAVENOUS

## 2019-05-27 MED ORDER — AMLODIPINE BESYLATE 5 MG PO TABS
5.0000 mg | ORAL_TABLET | Freq: Two times a day (BID) | ORAL | Status: DC
Start: 2019-05-27 — End: 2019-06-13
  Administered 2019-05-27 – 2019-06-13 (×26): 5 mg via ORAL
  Filled 2019-05-27 (×31): qty 1

## 2019-05-27 MED ORDER — BUPIVACAINE HCL (PF) 0.25 % IJ SOLN
INTRAMUSCULAR | Status: AC
Start: 2019-05-27 — End: ?
  Filled 2019-05-27: qty 60

## 2019-05-27 MED ORDER — DARBEPOETIN ALFA 60 MCG/0.3ML IJ SOSY
60.00 ug | PREFILLED_SYRINGE | INTRAMUSCULAR | Status: DC
Start: 2019-05-27 — End: 2019-06-13
  Administered 2019-05-27 – 2019-06-10 (×3): 60 ug via SUBCUTANEOUS
  Filled 2019-05-27 (×3): qty 0.3

## 2019-05-27 MED ORDER — SODIUM CHLORIDE 0.9 % IV SOLN
15.00 mg/kg | Freq: Once | INTRAVENOUS | Status: AC
Start: 2019-05-27 — End: 2019-05-27
  Administered 2019-05-27: 09:00:00 1750 mg via INTRAVENOUS
  Filled 2019-05-27: qty 1750

## 2019-05-27 MED ORDER — BISACODYL 10 MG RE SUPP
10.00 mg | Freq: Every day | RECTAL | Status: DC | PRN
Start: 2019-05-27 — End: 2019-06-13

## 2019-05-27 MED ORDER — MORPHINE SULFATE 4 MG/ML IJ/IV SOLN (WRAP)
4.0000 mg | Freq: Once | Status: AC
Start: 2019-05-27 — End: 2019-05-27
  Administered 2019-05-27: 05:00:00 4 mg via INTRAVENOUS
  Filled 2019-05-27: qty 1

## 2019-05-27 MED ORDER — METRONIDAZOLE IN NACL 500 MG/100 ML IV SOLN
500.00 mg | Freq: Once | INTRAVENOUS | Status: AC
Start: 2019-05-27 — End: 2019-05-27
  Administered 2019-05-27: 11:00:00 500 mg via INTRAVENOUS
  Filled 2019-05-27: qty 100

## 2019-05-27 SURGICAL SUPPLY — 48 items
ADHESIVE SKIN CLOSURE DERMABOND ADVANCED (Skin Closure) IMPLANT
ADHESIVE SKIN CLOSURE DERMABOND ADVANCED .7 ML LIQUID APPLICATOR (Skin Closure) IMPLANT
ADHESIVE SKNCLS 2 OCTYL CYNCRLT .7ML (Skin Closure)
APPLICATOR CHLORAPREP 26 ML 70% ISOPROPYL ALCOHOL 2% CHLORHEXIDINE (Applicator) ×1 IMPLANT
APPLICATOR PRP 70% ISPRP 2% CHG 26ML BD (Applicator) ×2 IMPLANT
BLADE 15 BARD-PARKER RIB-BACK SAFETYLOCK (Blade) ×1 IMPLANT
BLADE 15 BARD-PARKER RIB-BACK SAFETYLOCK CARBON STEEL SURGICAL (Blade) ×1 IMPLANT
BLADE SRG CBNSTL 15 BP RB-BCK SFLOK LF (Blade) ×2
DRAPE SRG TBRN CNVRT 121.5X106X77IN LF (Drape) IMPLANT
DRAPE SRG TBRN CNVRT 122X106X77IN LF (Drape) IMPLANT
DRAPE SURGICAL IMPERVIOUS REINFORCEMENT FENESTRATE ABSORBENT ARMBOARD (Drape) IMPLANT
DRAPE SURGICAL REINFORCE FENESTRATE L121.5 IN X W106 IN X H77 IN (Drape) IMPLANT
ELECTRODE ADULT PATIENT RETURN L9 FT REM POLYHESIVE ACRYLIC FOAM (Procedure Accessories) ×1 IMPLANT
ELECTRODE ELECTROSURGICAL BLADE PENCIL L10 FT OD3/8 IN PLUMEPEN ELITE (Other) ×1 IMPLANT
ELECTRODE ESURG BLDE PNCL PLUMEPEN ELT (Other) ×2 IMPLANT
ELECTRODE PATIENT RETURN L9 FT VALLEYLAB (Procedure Accessories) ×1 IMPLANT
ELECTRODE PT RTN RM PHSV ACRL FM C30- LB (Procedure Accessories) ×2
GLOVE SRG NTR RBR 8 BGL SRG LTX STRL PF (Glove) ×2
GLOVE SRG PLISPRN 8.5 BGL PI INDCTR (Glove) ×2
GLOVE SURGICAL 8 1/2 BIOGEL PI INDICATOR (Glove) ×1 IMPLANT
GLOVE SURGICAL 8 1/2 BIOGEL PI INDICATOR UNDERGLOVE POWDER FREE SMOOTH (Glove) ×1 IMPLANT
GLOVE SURGICAL 8 BIOGEL SURGEONS POWDER (Glove) ×1 IMPLANT
GLOVE SURGICAL 8 BIOGEL SURGEONS POWDER FREE BEAD CUFF TEXTURE SURFACE (Glove) ×1 IMPLANT
GOWN SRG 2XL XLNG SMARTGOWN LF STRL LVL (Gown) ×2 IMPLANT
GOWN SRG XL SMARTGOWN LF STRL LVL 4 (Gown) ×2
GOWN SURGICAL 2XL XLONG SMARTGOWN LEVEL 4 RAGLAN SLEEVE BREATHABLE (Gown) ×1 IMPLANT
GOWN SURGICAL XL SMARTGOWN LEVEL 4 (Gown) ×1 IMPLANT
GOWN SURGICAL XL SMARTGOWN LEVEL 4 BREATHABLE (Gown) ×1 IMPLANT
KIT RM TURNOVER LF NS DISP (Kits) ×2 IMPLANT
KIT ROOM TURNOVER NONSTERILE LATEX FREE DISPOSABLE (Kits) ×1 IMPLANT
SPONGE SURGICEL HEMO 4X8IN (Hemostat) ×2 IMPLANT
SUTURE ABS 2-0 SH VCL 27IN BRD COAT VIOL (Suture) ×2
SUTURE ABS 3-0 SH VCL 27IN BRD COAT UD (Suture) ×2
SUTURE ABS 4-0 PS2 MNCRL MTPS 18IN MFL (Suture)
SUTURE COATED VICRYL 2-0 SH L27 IN BRAID (Suture) ×1 IMPLANT
SUTURE COATED VICRYL 2-0 SH L27 IN BRAID COATED VIOLET ABSORBABLE (Suture) ×1 IMPLANT
SUTURE COATED VICRYL 3-0 SH L27 IN BRAID (Suture) ×1 IMPLANT
SUTURE MONOCRYL 4-0 PS-2 L18 IN (Suture) IMPLANT
SUTURE MONOCRYL 4-0 PS-2 L18 IN MONOFILAMENT UNDYED ABSORBABLE (Suture) IMPLANT
SUTURE NABSB 2-0 SH PRLN 36IN 2 ARM MFL (Suture)
SUTURE PROLENE BLUE 2-0 SH L36 IN 2 ARM (Suture) IMPLANT
SUTURE PROLENE BLUE 2-0 SH L36 IN 2 ARM MONOFILAMENT NONABSORBABLE (Suture) IMPLANT
TOWEL L26 IN X W17 IN COTTON PREWASH (Procedure Accessories) ×1 IMPLANT
TOWEL L26 IN X W17 IN COTTON PREWASH DELINT BLUE ACTISORB SURGICAL (Procedure Accessories) ×1 IMPLANT
TOWEL SRG CTTN 26X17IN LF STRL PREWASH (Procedure Accessories) ×2
TRAY SKIN BETANDINE PREP (Tray) ×2 IMPLANT
TRAY SRG LF STRL MIN DISP (Pack) ×2
TRAY SURGICAL MINOR (Pack) ×1 IMPLANT

## 2019-05-27 NOTE — Progress Notes (Signed)
Late entry     Pt was d/c yesterday late afternoon after HD. OP HD arranged for Thursday 5/6 at [facility].  HHL to arrange services.      Pt returned to ED this am with abdominal pain.                Dede Query, RN MSN       Clinical Case Manager       [facility]        [address], [place], [place] [address]      [email or web address]      [id number]     05/27/19 0903   Discharge Disposition   Patient preference/choice provided? Yes   Physical Discharge Disposition Home, Home Health   Mode of Transportation Car   Patient/Family/POA notified of transfer plan Patient informed only   Patient agreeable to discharge plan/expected d/c date? Yes   Bedside nurse notified of transport plan? Yes   Outpatient Services   Services Outpatient dialysis   CM Interventions   Follow up appointment scheduled? No   Reason no follow up scheduled? Family to schedule   Referral made for home health RN visit? Does not meet home bound criteria   Multidisciplinary rounds/family meeting before d/c? Yes   Medicare Checklist   Is this a Medicare patient? Yes

## 2019-05-27 NOTE — ED Notes (Signed)
[place]  ED NURSING NOTE FOR THE RECEIVING INPATIENT NURSE   [address]   ED CHARGE RN [phone]   ADMISSION INFORMATION   TESHIA MAHONE is a 64 y.o. female admitted with a diagnosis of:    1. Generalized abdominal pain    2. Vomiting and diarrhea    3. End stage renal disease on dialysis    4. Peritoneal dialysis catheter in place    5. Partial small bowel obstruction         Isolation: Dependent on covid   Allergies: Lisinopril and Metformin   Holding Orders confirmed? Yes   Belongings Documented? Yes   Home medications sent to pharmacy confirmed? No meds   NURSING CARE   Patient Comes From:   Mental Status: Home Independent  alert and oriented   ADL: Needs assistance with ADLs   Ambulation: 2 person assist   Pertinent Information  and Safety Concerns: N/A     COVID Test sent to lab? Yes   VITAL SIGNS   Time BP Temp Pulse Resp SpO2   0610 184/78   84 19 96% on RA   CT / NIH   CT Head ordered on this patient?  No   NIH/Dysphagia assessment done prior to admission? No   PERSONAL PROTECTIVE EQUIPMENT   Gloves and N95   LAB RESULTS   Labs Reviewed   CBC AND DIFFERENTIAL - Abnormal; Notable for the following components:       Result Value    WBC 15.22 (*)     Hgb 10.4 (*)     Hematocrit 34.1 (*)     RBC 3.54 (*)     MCV 96.3 (*)     MCHC 30.5 (*)     Nucleated RBC 0.1 (*)     Neutrophils Absolute 13.21 (*)     Monocytes Absolute Automated 0.98 (*)     Immature Granulocytes Absolute 0.08 (*)     Absolute NRBC 0.02 (*)     All other components within normal limits    Narrative:     Replace urinary catheter prior to obtaining the urine culture  if it has been in place for greater than or equal to 14  days:->N/A No Foley  Indications for U/A Reflex to Micro - Reflex to  Culture:->Suprapubic Pain/Tenderness or Dysuria   COMPREHENSIVE METABOLIC PANEL - Abnormal; Notable for the following components:    Glucose 155 (*)     Creatinine 3.0 (*)     Calcium 7.8 (*)     Protein,  Total 5.4 (*)     Albumin 2.1 (*)     Alkaline Phosphatase 108 (*)     Albumin/Globulin Ratio 0.6 (*)     All other components within normal limits    Narrative:     Replace urinary catheter prior to obtaining the urine culture  if it has been in place for greater than or equal to 14  days:->N/A No Foley  Indications for U/A Reflex to Micro - Reflex to  Culture:->Suprapubic Pain/Tenderness or Dysuria   COVID-19 (SARS-COV-2)    Narrative:     o Collect and clearly label specimen type:  o Upper respiratory specimen: One Nasopharyngeal Dry Swab NO  Transport Media.  o Hand deliver to laboratory ASAP  Indication for testing->Extended care facility admission to  semi private room   CULTURE BLOOD AEROBIC AND ANAEROBIC    Narrative:     1 BLUE+1 PURPLE  CULTURE BLOOD AEROBIC AND ANAEROBIC    Narrative:     1 BLUE+1 PURPLE   CLOSTRIDIUM DIFFICILE TOXIN B PCR   STOOL FOR SALMONELLA,SHIGELLA,CAMPYLOBACTER AND SHIGA TOXIN PCR   STOOL CRYPTOSPORIDIUM/GIARDIA ANTIGEN   CULTURE + GRAM STAIN,AEROBIC, BODY FLUID   LIPASE    Narrative:     Replace urinary catheter prior to obtaining the urine culture  if it has been in place for greater than or equal to 14  days:->N/A No Foley  Indications for U/A Reflex to Micro - Reflex to  Culture:->Suprapubic Pain/Tenderness or Dysuria   LACTIC ACID, PLASMA   HEMOLYSIS INDEX    Narrative:     Replace urinary catheter prior to obtaining the urine culture  if it has been in place for greater than or equal to 14  days:->N/A No Foley  Indications for U/A Reflex to Micro - Reflex to  Culture:->Suprapubic Pain/Tenderness or Dysuria   GFR    Narrative:     Replace urinary catheter prior to obtaining the urine culture  if it has been in place for greater than or equal to 14  days:->N/A No Foley  Indications for U/A Reflex to Micro - Reflex to  Culture:->Suprapubic Pain/Tenderness or Dysuria   BODY FLUID CELL COUNT   URINALYSIS REFLEX TO MICROSCOPIC EXAM - REFLEX TO CULTURE          Ticket to Ride  Printed: Yes

## 2019-05-27 NOTE — PT Eval Note (Signed)
[facility]  Physical Therapy Attempt Note    Patient:  [name] MRN#:  [id number]  Unit:  [place] Room/Bed:  U3735/D8978.B      Physical Therapy evaluation attempted on 05/27/2019 at 10:41 AM unable to complete secondary to RN requesting PT follow up later this afternoon 2/2 pt with increase abdominal pain. Therapy will f/u later today as schedule permits.    RN aware.    Lenord Fellers, PT, DPT  Physical Therapist  Physical Medicine & Rehabilitation  [phone]  Mon-Thur 7-5:30pm  05/27/2019 10:42 AM

## 2019-05-27 NOTE — Plan of Care (Signed)
Pt is admitted this morning with abd pain. Pt is given admission instruction, she verbalized understanding. Pt is a/ox4. Vss. Pt was c/o of abd pian. Pt is given morphine with a good relief. Pt is NPO for possible PD cath removal. PT is on IV abc Po abx. Female external cath is in place.    Problem: Safety  Goal: Patient will be free from injury during hospitalization  05/27/2019 1836 by Loleta Rose, RN  Flowsheets (Taken 05/27/2019 1835)  Patient will be free from injury during hospitalization:   Assess patient's risk for falls and implement fall prevention plan of care per policy   Use appropriate transfer methods   Include patient/ family/ care giver in decisions related to safety   Assess for patients risk for elopement and implement [place] per policy   Provide alternative method of communication if needed (communication boards, writing)   Ensure appropriate safety devices are available at the bedside   [place] rounding   Provide and maintain safe environment  05/27/2019 [place] by Loleta Rose, [place] (Taken 05/27/2019 1835)  Patient will be free from injury during hospitalization:   Assess patient's risk for falls and implement fall prevention plan of care per policy   Use appropriate transfer methods   Include patient/ family/ care giver in decisions related to safety   Assess for patients risk for elopement and implement [place] per policy   Provide alternative method of communication if needed (communication boards, writing)   Ensure appropriate safety devices are available at the bedside   [place] rounding   Provide and maintain safe environment     Problem: Pain  Goal: Pain at adequate level as identified by patient  05/27/2019 1836 by Loleta Rose, RN  Flowsheets (Taken 05/27/2019 1835)  Pain at adequate level as identified by patient:   Assess for risk of opioid induced respiratory depression, including snoring/sleep apnea. Alert healthcare team of risk factors identified.   Assess pain  on admission, during daily assessment and/or before any "as needed" intervention(s)   Evaluate if patient comfort function goal is met   Consult/collaborate with Physical Therapy, Occupational Therapy, and/or Speech Therapy   Include patient/patient care companion in decisions related to pain management as needed   Evaluate patient's satisfaction with pain management progress  05/27/2019 1835 by Loleta Rose, RN  Flowsheets (Taken 05/27/2019 1835)  Pain at adequate level as identified by patient:   Assess for risk of opioid induced respiratory depression, including snoring/sleep apnea. Alert healthcare team of risk factors identified.   Assess pain on admission, during daily assessment and/or before any "as needed" intervention(s)   Evaluate if patient comfort function goal is met   Consult/collaborate with Physical Therapy, Occupational Therapy, and/or Speech Therapy   Include patient/patient care companion in decisions related to pain management as needed   Evaluate patient's satisfaction with pain management progress     Problem: Side Effects from Pain Analgesia  Goal: Patient will experience minimal side effects of analgesic therapy  Flowsheets (Taken 05/27/2019 1835)  Patient will experience minimal side effects of analgesic therapy: Prevent/manage side effects per LIP orders (i.e. nausea, vomiting, pruritus, constipation, urinary retention, etc.)     Problem: Altered GI Function  Goal: Elimination patterns are normal or improving  Flowsheets (Taken 05/27/2019 1836)  Elimination patterns are normal or improving:   Anticipate/assist with toileting needs   Assess for normal bowel sounds   Monitor for abdominal discomfort   Assess for signs and symptoms of  bleeding.  Report signs of bleeding to physician   Monitor for abdominal distension

## 2019-05-27 NOTE — UM Notes (Signed)
Requesting final auth to be faxed to [phone]     Auth Number :  pending     Discharge Date -  05/26/2019  7:10 PM    Hospital Course  Presentation History   Per Dr Ann Maki "[name] a 64 y.o.femalewith history ofhypertension, hyperlipidemia, insulin-dependent diabetes mellitus, COPD, end-stage renal disease on peritoneal dialysiswho presents to the emergency department for evaluation of a 3-day history of intractable nausea,vomiting and nonbloody diarrhea. She states that she ate an ultimate omelette at [facility] on 4/16/21and following that her symptoms started.She complains of constant diffuseabdominal pain described as achingpain, reaching10 out of 10 in severity which is exacerbated by movement. She also reports a fever 102 last night along with chills,feeling dizzy and near syncope, generalized weakness. She reports no history of renal stones. She does report having a UTI in the past.  She states that she had noted blood in one of her episodes of emesis. She states that she was hospitalized 1 month ago at [place] where she had an upper endoscopy but she cannotrecall the results.  States that she was started on hemodialysis last May 2020 and after that she was transitioned to peritoneal dialysis at home. She states that she intermittently makes urine and last week she had frequent urination.  She reports noheadache,visual changes,syncope, loss of consciousness, hematochezia, melena, dysuria, hematuria, numbness/tingling, focal weakness.   In the ED, patient was hypertensive on presentation, afebrile, not tachycardic or hypoxic. Labs significant for glucose of 551, creatinine 4.7. She received 3 Young normal saline bolus and vancomycin and Zosyn in the ED    See HPI for details.    Hospital Course (14 Days)   [name] a 64 y.o.femalewith history ofhypertension, hyperlipidemia, insulin-dependent diabetes mellitus, COPD, end-stage renal disease on peritoneal  dialysiswho presents to the emergency department for evaluation of a 3-day history of intractable nausea,vomiting and nonbloody diarrhea, admitted for viral gastroenteritis and UTI.  She was found to have peritonitis and started on IV antibiotics with dialysis. Delay in [place] due to awaiting placement for SNF. Pt then voiced that she wanted to be transitioned from PD to HD. She had a tunneled dialysis cathter placed on 4/30. She was cleared for [place], she then voiced that she would like to now return home as she is feeling stronger and does not need rehab.     #UGIB - Patient had 1 episode of 400 cc coffee ground emesis on 4/20 per RN  #Severe esophagitis  -s/p EGD on 4/21: Grade D gastritis  -Continue Protonix 40 mg BID  -Ok to restart ASA and pletal per GI however was no longer on asa/pletal per patient since Aug 2020 due to GI bleeding  -H/H stable   -GI recommendations appreciated. Outpatient follow-up for repeat EGD.    Chest pain  -EKG NSR, troponin x2 negative.  -D dimer elevated, CTA chest negative for PE.     #Ecoli Peritonitis, pansensitive   #Abdominal pain  #Nausea, vomiting, diarrhea - Resolved  #UTI  -Likelydue toviral gastroenteritis/UTI/Peritonitis  -Peritoneal fluid with 51,000 WBC's.Improved to 200s  -Blood cultures NGTD, lactic acid normal and lipase normal  -Urine culture: >100,000 CFU/ML of multiple bacterial morphotypes   -CT abd/pelvis wo contrastrevealsno evidence of bowel obstruction, bowel ischemia, perforation, diverticulitis, pancreatitis, nephrolithiasis and shows intraperitoneal fluid  -PRN antiemetics for nausea/vomiting  -Continue IPcefazolin with HD-- for total 3 weeks  -ID recommendations appreciated    #Hypokalemia  #Hypomagnesemia  -Resolved    #ESRD on peritoneal dialysis-->  HD  -Started on dialysis on 05/2018  -CT abd/pelvis shows moderate amount of ascites ? Dialysis fluid  -Under care everywhere, noted that Cr from 10/2018-11/2018 has been 3.8-5 range  -patient  switching to HD per request  -s/p  tunneled catheter placement.   -Nephrology following    #Hypertension  -Continue home amlodipine, coreg    #Hyperglycemia  #Type 2 DM  -Continue home lantus, SSI, ac/hs accuchecks  -HbA1c 9.8%    #Hyperlipidemia   -Continue home statin    #Iron deficiency anemia  #Anemia of Chronic disease   -Completed IV Venofer 3/3  -S/p 1 unit pRBC transfusion     Delay in [place] due to pt awaiting placement for HD    Procedures/Imaging:   Tunneled Cath Placement (Permcath)    Result Date: 05/23/2019   Technically successful placement of an  23cm Equistream catheter via the right internal jugular vein as described above. Phlebitic changes in the right internal jugular vein from prior catheter placement. Diane Papadouris, MD  05/23/2019 3:51 PM    ER ADMIT DATE AND TIME: 05/12/2019  2:44 PM    IP ADMIT DATE: 05/12/19     NAME: [name]             MR#: [id number]      Patient Address:  [place]  [place] [address]    Patient phone: [phone] (home)     PATIENT NAME: [name],[name]  DOB: 1955/04/24   PMH:  has a past medical history of Chronic obstructive pulmonary disease, Diabetes mellitus, Hyperlipidemia, Hypertension, and Sleep apnea (2018).  PSH:  has a past surgical history that includes [place] surgery (Left); Joint replacement; Joint replacement; EGD, BIOPSY (N/A, 05/14/2019); and Tunneled Cath Placement (Permcath) (Bilateral, 05/23/2019).      DIAGNOSIS:     ICD-10-CM    1. AKI (acute kidney injury)  N17.9    2. Coffee ground emesis  K92.0 Surgical Pathology     Surgical Pathology   3. Peritoneal dialysis catheter dysfunction, initial encounter  T85.611A Tunneled Cath Placement (Permcath)     Tunneled Cath Placement (Permcath)    Added automatically from request for surgery [id number]   4. Peritoneal dialysis catheter dysfunction, initial encounter  T85.611A Tunneled Cath Placement (Permcath)     Tunneled Cath Placement (Permcath)

## 2019-05-27 NOTE — Plan of Care (Signed)
Discharge Recommendation: SNF(if pain controlled, may progess home w/ HHPT/OT)  DME Recommended for Discharge: Front wheel walker(requires cont. assessment)    Is an Occupational Therapy Evaluation Indicated at this time? Yes, an OT evaluation is indicated at this time.    Treatment/Interventions: Exercise, Gait training, LE strengthening/ROM, Endurance training, Bed mobility, Continued evaluation  PT Frequency: 2-3x/wk     PMP Activity: Step 4 - Dangle at Bedside  Distance Walked (ft) (Step 6,7): 0 Feet  (Please See Therapy Evaluation for device and assistance level needed)    Goals:   Goals  Goal Formulation: With patient  Time for Goal Acheivement: By time of discharge  Pt Will Go Supine To Sit: modified independent, to maximize functional mobility and independence  Pt Will Perform Sit To Supine: modified independent, to maximize functional mobility and independence  Pt Will Perform Sit to Stand: with supervision, to maximize functional mobility and independence

## 2019-05-27 NOTE — H&P (Signed)
[facility]      Patient: [name]  Date: 05/27/2019   DOB: 04/10/1955  Admission Date: 05/27/2019   MRN: [id number]  Attending: Dr. Tyrell Antonio NP         Chief Complaint   Patient presents with    Abdominal Pain      History Gathered From: Patient and chart review     HISTORY AND PHYSICAL     ARON NEEDLES is a 64 y.o. female with a PMHx of  hypertension, hyperlipidemia, insulin-dependent diabetes mellitus, COPD, end-stage renal disease on peritoneal dialysis   Recently switched to hemodialysis and discharged yesterday return to the ED with recurrent abdominal pain associated with Non bloody diarrhea, nausea vomiting of nonbilious and nonbloody vomitus.  Patient states that she continued to have loose watery stools on arriving at home yesterday followed by nausea vomiting 2-3 times, described abdominal pain as left-sided pain intensity greater than 10, pain is worse with palpation.  She denies fever/chills, dysuria, chest pain, shortness of breath, blood in stool or vomitus.  She had dialysis yesterday prior to discharge and have not arranged transportation for outpatient dialysis yet.    In the emergency room, CT abdomen pelvis showed partial small bowel obstruction, trace bilateral pleural effusions, revealed WBC of 15.22, H&H of 10.4/34.1, stool studies pending, infectious disease Dr. Lahoma Crocker and Surgery Dr. Signa Kell consulted.      Past Medical History:   Diagnosis Date    Chronic obstructive pulmonary disease     Diabetes mellitus     Hyperlipidemia     Hypertension     Sleep apnea 2018       Past Surgical History:   Procedure Laterality Date    EGD, BIOPSY N/A 05/14/2019    Procedure: EGD, BIOPSY;  Surgeon: Ronie Spies, MD;  Location: [facility];  Service: Gastroenterology;  Laterality: N/A;    JOINT REPLACEMENT      shoulder    JOINT REPLACEMENT      knee    OVARY SURGERY Left     Removal    TUNNELED CATH PLACEMENT (PERMCATH) Bilateral 05/23/2019    Procedure:  TUNNELED CATH PLACEMENT;  Surgeon: Jacques Earthly, MD;  Location: [facility];  Service: Interventional Radiology;  Laterality: Bilateral;       Prior to Admission medications    Medication Sig Start Date End Date Taking? Authorizing Provider   amLODIPine (NORVASC) 5 MG tablet Take 1 tablet (5 mg total) by mouth every 12 (twelve) hours 05/20/19   Matiana, Rollen Sox, MD   atorvastatin (LIPITOR) 40 MG tablet Take 40 mg by mouth daily    [provider]   carvedilol (COREG) 25 MG tablet Take 25 mg by mouth    [provider]   famotidine (PEPCID) 40 MG tablet Take 40 mg by mouth daily    [provider]   furosemide (LASIX) 40 MG tablet Take 40 mg by mouth 2 (two) times daily    [provider]   gabapentin (NEURONTIN) 600 MG tablet Take 600 mg by mouth 3 (three) times daily    [provider]   insulin aspart (NovoLOG) 100 UNIT/ML injection Inject into the skin 3 (three) times daily before meals    [provider]   insulin glargine (LANTUS) 100 UNIT/ML injection Inject 10 Units into the skin every morning 05/27/19   Dolan Amen D, MD   oxyCODONE-acetaminophen (PERCOCET) 5-325 MG per tablet Take 1 tablet by mouth every  4 (four) hours as needed for Pain    [provider]   pantoprazole (PROTONIX) 40 MG tablet Take 1 tablet (40 mg total) by mouth 2 (two) times daily 05/20/19   Ardeth Sportsman, MD   sevelamer (RENVELA) 800 MG tablet Take 800 mg by mouth 3 (three) times daily with meals    [provider]       Allergies   Allergen Reactions    Lisinopril Hives    Metformin Hives       CODE STATUS:  DNR    PRIMARY CARE MD: Pcp, None, MD    Family History   Problem Relation Age of Onset    Diabetes Mother     Hypertension Mother     Diabetes Father     Hypertension Father     Diabetes Sister     Hypertension Sister     Diabetes Brother     Hypertension Brother     Diabetes Paternal Aunt     Diabetes Paternal Uncle        Social History      Tobacco Use    Smoking status: Never Smoker    Smokeless tobacco: Never Used   Substance Use Topics    Alcohol use: Never    Drug use: Never       REVIEW OF SYSTEMS   Positive for:  See HPI  Negative for:  See HPI  All ROS completed and otherwise negative.    PHYSICAL EXAM      Vital Signs (most recent): BP 144/71    Pulse 83    Temp 99.7 F (37.6 C) (Oral)    Resp 17    Ht 1.575 m (5\' 2" )    Wt 113.4 kg (250 lb)    LMP  (LMP Unknown) Comment: post menopausal   SpO2 98%    BMI 45.73 kg/m   Constitutional: Nontoxic looking, in moderate distress due to pain, alert oriented x3.  HEENT: NC/AT, PERRL, no scleral icterus or conjunctival pallor, no nasal discharge, MMM   Neck: trachea midline, supple, no masses  Cardiovascular: RRR, normal S1 S2, no murmurs, no JVD,    Respiratory: Normal rate and breathing, clear to auscultation bilaterally.  Gastrointestinal: +BS, non-distended, soft, tender to palpation, worse on left side,   No rebound, +guarding   Genitourinary: no suprapubic or costovertebral angle tenderness  Musculoskeletal: ROM and motor strength grossly normal. No clubbing, edema, or cyanosis. DP and radial pulses 2+ and symmetric.  Skin exam: No visible lesions or rash noted.  Neurologic: EOMI, CN 2-12 grossly intact. no gross motor or sensory deficits  Psychiatric: AAOx3, affect and mood appropriate. The patient is alert, interactive, appropriate.  Capillary refill:  Less than 3 seconds    Exam done by Lisette Grinder, NP on 05/27/19 at 10:01 AM      LABS & IMAGING     Recent Results (from the past 24 hour(s))   Glucose Whole Blood - POCT    Collection Time: 05/26/19 11:43 AM   Result Value Ref Range    Whole Blood Glucose POCT 166 (H) 70 - 100 mg/dL   Glucose Whole Blood - POCT    Collection Time: 05/26/19  5:42 PM   Result Value Ref Range    Whole Blood Glucose POCT 133 (H) 70 - 100 mg/dL   CBC and differential    Collection Time: 05/27/19  5:07 AM   Result Value Ref Range    WBC 15.22 (H) 3.10 -  9.50 x10 3/uL    Hgb 10.4 (L) 11.4 - 14.8 g/dL    Hematocrit 34.1 (L) 34.7 - 43.7 %    Platelets 165 142 - 346 x10 3/uL    RBC 3.54 (L) 3.90 - 5.10 x10 6/uL    MCV 96.3 (H) 78.0 - 96.0 fL    MCH 29.4 25.1 - 33.5 pg    MCHC 30.5 (L) 31.5 - 35.8 g/dL    RDW 13 11 - 15 %    MPV 11.6 8.9 - 12.5 fL    Neutrophils 86.8 None %    Lymphocytes Automated 5.8 None %    Monocytes 6.4 None %    Eosinophils Automated 0.2 None %    Basophils Automated 0.3 None %    Immature Granulocytes 0.5 None %    Nucleated RBC 0.1 (H) 0.0 - 0.0 /100 WBC    Neutrophils Absolute 13.21 (H) 1.10 - 6.33 x10 3/uL    Lymphocytes Absolute Automated 0.88 0.42 - 3.22 x10 3/uL    Monocytes Absolute Automated 0.98 (H) 0.21 - 0.85 x10 3/uL    Eosinophils Absolute Automated 0.03 0.00 - 0.44 x10 3/uL    Basophils Absolute Automated 0.04 0.00 - 0.08 x10 3/uL    Immature Granulocytes Absolute 0.08 (H) 0.00 - 0.07 x10 3/uL    Absolute NRBC 0.02 (H) 0.00 - 0.00 x10 3/uL   Comprehensive metabolic panel    Collection Time: 05/27/19  5:07 AM   Result Value Ref Range    Glucose 155 (H) 70 - 100 mg/dL    BUN 13.0 7.0 - 19.0 mg/dL    Creatinine 3.0 (H) 0.6 - 1.0 mg/dL    Sodium 139 136 - 145 mEq/L    Potassium 4.1 3.5 - 5.1 mEq/L    Chloride 103 100 - 111 mEq/L    CO2 28 22 - 29 mEq/L    Calcium 7.8 (L) 8.5 - 10.5 mg/dL    Protein, Total 5.4 (L) 6.0 - 8.3 g/dL    Albumin 2.1 (L) 3.5 - 5.0 g/dL    AST (SGOT) 9 5 - 34 U/L    ALT <6 0 - 55 U/L    Alkaline Phosphatase 108 (H) 37 - 106 U/L    Bilirubin, Total 1.0 0.2 - 1.2 mg/dL    Globulin 3.3 2.0 - 3.6 g/dL    Albumin/Globulin Ratio 0.6 (L) 0.9 - 2.2    Anion Gap 8.0 5.0 - 15.0   Lipase    Collection Time: 05/27/19  5:07 AM   Result Value Ref Range    Lipase 21 8 - 78 U/L   Lactic Acid    Collection Time: 05/27/19  5:07 AM   Result Value Ref Range    Lactic Acid 1.1 0.2 - 2.0 mmol/L   Hemolysis index    Collection Time: 05/27/19  5:07 AM   Result Value Ref Range    Hemolysis Index 1 0 - 18   GFR    Collection Time:  05/27/19  5:07 AM   Result Value Ref Range    EGFR 19.0    Body Fluid Cell Count    Collection Time: 05/27/19  5:07 AM   Result Value Ref Range    Body Fluid Source: Peritoneal Flui    COVID-19 (SARS-COV-2) ([place] [facility])    Collection Time: 05/27/19  6:24 AM    Specimen: Nasopharynx; Nasopharyngeal Swab   Result Value Ref Range    Purpose of COVID testing Screening     SARS-CoV-2 Specimen  Source Nasopharyngeal     SARS CoV 2 Overall Result Negative    Glucose Whole Blood - POCT    Collection Time: 05/27/19  9:00 AM   Result Value Ref Range    Whole Blood Glucose POCT 164 (H) 70 - 100 mg/dL       MICROBIOLOGY:  Blood Culture: Pending  Urine Culture:    Antibiotics Started: Vancomycin, Flagyl, Ancef    IMAGING:  Upon my review:    CT Abd/Pelvis with IV Contrast only    Result Date: 05/27/2019  1. Fluid-filled mildly distended loops of small bowel with distal decompressed small bowel loops. Findings may represent early or partial small bowel obstruction. Enteritis is felt to be less likely. 2. Trace bilateral pleural effusions. 3. Additional chronic findings as above. Delaney Meigs, MD  05/27/2019 6:07 AM    Tunneled Cath Placement (Permcath)    Result Date: 05/23/2019   Technically successful placement of an  23cm Equistream catheter via the right internal jugular vein as described above. Phlebitic changes in the right internal jugular vein from prior catheter placement. Dimitrios Papadouris, MD  05/23/2019 3:51 PM      CARDIAC:  EKG Interpretation (upon my review):       Markers:        EMERGENCY DEPARTMENT COURSE:  Orders Placed This Encounter   Procedures    Blood Culture Aerobic/Anaerobic #1    Blood Culture Aerobic/Anaerobic #2    Stool for C. diff    Stool for Salm/Shig/Campy/Shiga PCR    Ova and parasite screen    Culture + Gram Stain,Aerobic, Body Fluid    COVID-19 (SARS-COV-2) ([place] [facility])    CT Abd/Pelvis with IV Contrast only    CBC and differential    Comprehensive metabolic panel    Lipase    Lactic  Acid    UA Reflex to Micro - Reflex to Culture    Hemolysis index    GFR    Body Fluid Cell Count    Comprehensive metabolic panel    CBC without differential    Ambulatory referral to Transitional Care    Cardiac monitoring (ED Only)    May be transported off monitor    Pulse Oximetry    Progressive Mobility Protocol    Notify physician    I/O    Height    Skin assessment    Place sequential compression device    Maintain sequential compression device    Education: Activity    Education: Disease Process & Condition    Education: Pain Management    Education: Falls Risk    Education: Smoking Cessation    Notify Physician (Critical Blood Glucose Value)    Notify physician (Communication: Document Abnormal Blood Glucose)    Target Glucose Goals    Nursing communication    NSG Communication: Glucose POCT order (PRN hypoglycemia)    If NPO, give half NPH dose    Nursing communication: Adult Hypoglycemia Treatment Algorithm    NSG Communication: Glucose POCT order    NSG Communication: Glucose POCT order    Notify    Hemodialysis Interventions    Bed rest (Supine; may elevate HOB 45 degrees or higher degrees)    Notify physician    May Use : Tube/Line    Change dressing to central venous catheter site (per protocol)    Do not use Scissors near catheter    Post procedure site assessment (check puncture site for bleed/hematoma)    Vital signs with pulse ox (q15  mins x4, q 30 mins x2, then per routine)    Notify    Hemodialysis Interventions    NO CPR - Allow Natural Death    ED [facility] Communication Order    ED [facility] Communication Order    Glucerna Supplement Quantity: A. One; Flavor: Strawberry; Frequency: Daily with lunch    OT eval and treat    PT evaluate and treat    Glucose Whole Blood - POCT    ECG 12 lead    Saline lock IV    Saline lock IV    Initiate IV access    Admit to Observation       ASSESSMENT & PLAN     LETESHA KLECKER is a 64 y.o. female admitted  with Generalized abdominal pain.    Patient Active Hospital Problem List:  Generalized abdominal pain (05/27/2019)  Nausea vomiting diarrhea/gastroenteritis  End-stage renal disease on dialysis Tuesdays Thursdays and Saturdays  Partial small bowel obstruction  Leukocytosis           Plan:   CT abdomen pelvis showed partial small bowel obstruction, trace bilateral pleural effusions, revealed WBC of 15.22, H&H of 10.4/34.1, stool studies pending, infectious disease Dr. Lahoma Crocker consulted.    Abdominal pain likely due to viral gastroenteritis versus C. difficile infection, versus lactulose induced.    Patient has been using IV antibiotic and was discharged on IV cefazolin  Lipase is normal, lactic acid normal  Patient received a dose of vancomycin, Flagyl and Ancef in the ED  Infectious disease Dr. Lahoma Crocker is on board who will see patient today continue morphine as needed for severe pain  As well as Percocet for moderate pain    ESRD on peritoneal dialysis  -Started on dialysis on 05/2018  -CT abd/pelvis showed trace bilateral pleural effusions ? Dialysis fluid  Patient still has peritoneal dialysis access on the left side of her abdomen  We will consult CV IR for possible removal.      Hypertension   - continue home amlodipine, coreg    Hyperglycemia  Type 2 DM  -Continue home lantus, SSI, ac/hs accuchecks  -Check A1c    Hyperlipidemia   -Continue home statin       Nutrition  NPO    DVT/VTE Prophylaxis  SCDs    Anticipated medical stability for discharge: TBD    Service status/Reason for ongoing hospitalization: Sided abdominal pain, end-stage renal disease on dialysis, recently treated for peritonitis  Anticipated Discharge Needs:     Alanda Slim NP    05/27/2019 10:01 AM  Time Elapsed:

## 2019-05-27 NOTE — Progress Notes (Signed)
INFECTIOUS DISEASES PROGRESS NOTE    Date Time: 05/27/19 10:48 AM  Patient Name: [name],[name]  Patient status.Observation  Hospital Day: 0    Assessment and Plan:   1.  PD catheter peritonitis/E. Coli  2.  Started on hemodialysis with new catheter placed  3.  PD catheter remains in place  4.  Abdominal pain and diarrhea  Most likely reactive to the catheter still being in place and not being used  Rule out C. difficile.  CT scan did not show colitis    Plan: 1.  Remove PD catheter  2.  Continue IV Flagyl  3.  Resume IV cefazolin  4.  P.o. vancomycin empirically until C. difficile is back  Subjective:   Nontoxic    Review of Systems:   Review of Systems -severe abdominal pain with diarrhea  No cough or shortness of breath  No angina or palpitations  No nausea or vomiting  No headache syncope or seizures    Antibiotics:   As above    Other medications reviewed in EPIC.  Central Access:   New dialysis access day 3    Physical Exam:     Vitals:    05/27/19 0857   BP: 144/71   Pulse: 83   Resp: 17   Temp: 99.7 F (37.6 C)   SpO2: 98%       Chest - clear to auscultation, no wheezes, rales or rhonchi, symmetric air entry  Heart - normal rate, regular rhythm, normal S1, S2, no murmurs, rubs, clicks or gallops  Abdomen - tenderness noted There is tenderness around the PD catheter.  There is no exit site drainage nor erythema.  bowel sounds normal    Labs:     Microbiology Results     Procedure Component Value Units Date/Time    Blood Culture Aerobic/Anaerobic #1 [[id number]] Collected: 05/27/19 0507    Specimen: Arm from Blood, Venipuncture Updated: 05/27/19 0950    Narrative:      1 BLUE+1 PURPLE    Blood Culture Aerobic/Anaerobic #2 [[id number]] Collected: 05/27/19 0507    Specimen: Arm from Blood, Venipuncture Updated: 05/27/19 0950    Narrative:      1 BLUE+1 PURPLE    COVID-19 (SARS-COV-2) [place] [facility]) [[id number]] Collected: 05/27/19 0624    Specimen: Nasopharyngeal Swab from Nasopharynx Updated: 05/27/19 0656      Purpose of COVID testing Screening     SARS-CoV-2 Specimen Source Nasopharyngeal     SARS CoV 2 Overall Result Negative     Comment: Test performed using the Abbott ID NOW EUA assay.  Please see Fact Sheets for patients and providers located at:  http://olson-hall.info/  This test is for the qualitative detection of SARS-CoV-2  (COVID19) nucleic acid. Viral nucleic acids may persist in vivo,  independent of viability. Detection of viral nucleic acid does  not imply the presence of infectious virus, or that virus  nucleic acid is the cause of clinical symptoms. Negative  results should be treated as presumptive and, if inconsistent  with clinical signs and symptoms or necessary for patient  management, should be tested with an alternative molecular  assay. Negative results do not preclude SARS-CoV-2 infection  and should not be used as the sole basis for patient  management decisions. Invalid results may be due to inhibiting  substances in the specimen and recollection should occur.         Narrative:      o Collect and clearly label specimen type:  o Upper respiratory  specimen: One Nasopharyngeal Dry Swab NO  Transport Media.  o Hand deliver to laboratory ASAP  Indication for testing->Extended care facility admission to  semi private room    Culture + Gram Lenise Arena, Body Fluid [[id number]] Collected: 05/27/19 0534    Specimen: Body Fluid from Paracentesis Fluid Updated: 05/27/19 1009          CBC w/Diff CMP   Recent Labs   Lab 05/27/19  0507 05/26/19  0315 05/25/19  0418   WBC 15.22* 11.52* 8.67   Hgb 10.4* 9.5* 10.2*   Hematocrit 34.1* 31.1* 34.1*   Platelets 165 157 177   MCV 96.3* 97.2* 96.6*   Neutrophils 86.8  --   --        PT/INR         Recent Labs   Lab 05/27/19  0507 05/26/19  0315 05/25/19  0418   Sodium 139 141 142   Potassium 4.1 3.8 3.9   Chloride 103 105 105   CO2 28 27 30*   BUN 13.0 23.0* 16.0   Creatinine 3.0* 4.0* 3.4*   Glucose 155* 127* 113*   Calcium 7.8* 7.5* 7.7*   Protein,  Total 5.4* 4.7* 4.8*   Albumin 2.1* 1.9* 1.9*   AST (SGOT) 9 13 15    ALT <6 <6 <6   Alkaline Phosphatase 108* 92 97   Bilirubin, Total 1.0 0.5 0.3      Glucose POCT   Recent Labs   Lab 05/27/19  0507 05/26/19  0315 05/25/19  0418 05/24/19  0337 05/23/19  0451 05/22/19  0440 05/21/19  0425   Glucose 155* 127* 113* 253* 316* 342* 414*   415*          Rads:     Radiology Results (24 Hour)     Procedure Component Value Units Date/Time    CT Abd/Pelvis with IV Contrast only [[id number]] Collected: 05/27/19 0602    Order Status: Completed Updated: 05/27/19 0609    Narrative:      CT ABDOMEN PELVIS W IV/ WO PO CONT      HISTORY: abd pain worst on Young side. ESRD on HD.    COMPARISON: 05/12/2019.    TECHNIQUE: Multiple axial CT images were obtained through the abdomen  and pelvis after the administration of 130 cc of Visipaque 320  intravenous contrast. Oral contrast was not given. Coronal and sagittal  MPR  images were reconstructed. One of the following dose optimization  techniques was utilized in the performance of this exam: Automated  exposure control; adjustment of the mA and/or kV according to the  patient's size; or use of an iterative  reconstruction technique.   Specific details can be referenced in the facility's radiology CT exam  operational policy.    FINDINGS:    Lung bases:  Trace bilateral pleural effusions are present. Small hiatal  hernia is stable.    Hepatobiliary: The liver is normal. The gallbladder is normal. The  common bile duct is normal in size.     Spleen: Normal.    Pancreas: Normal.    Adrenals/Kidneys: The adrenal glands are normal. Kidneys are normal. No  stones or hydronephrosis.    Bowel/Mesentery/peritoneum: Appendix is normal. Peritoneal dialysis  catheter is present. There are fluid-filled mildly distended loops of  small bowel with relatively decompressed distal small bowel loops.  Transition point is not identified. There is fluid in the colon. No free  air. Mild free fluid is present  and similar to the prior study.  Lymph nodes: No enlarged lymph nodes.    Vessels: Moderate vascular calcifications are present.    Pelvic GU: The bladder is normal. Calcified uterine fibroids are seen.    Other: Negative.    Musculoskeletal: Degenerative changes are present in the spine.      Impression:        1. Fluid-filled mildly distended loops of small bowel with distal  decompressed small bowel loops. Findings may represent early or partial  small bowel obstruction. Enteritis is felt to be less likely.  2. Trace bilateral pleural effusions.  3. Additional chronic findings as above.    Delaney Meigs, MD   05/27/2019 6:07 AM            Signed by: Ernst Breach

## 2019-05-27 NOTE — ED Notes (Signed)
Bed: BL19  Expected date:   Expected time:   Means of arrival:   Comments:  [id number]

## 2019-05-27 NOTE — Anesthesia Preprocedure Evaluation (Addendum)
Anesthesia Evaluation    AIRWAY    Mallampati: II    TM distance: >3 FB  Neck ROM: full  Mouth Opening:full  Planned to use difficult airway equipment: No CARDIOVASCULAR    cardiovascular exam normal       DENTAL           PULMONARY    pulmonary exam normal     OTHER FINDINGS    Pt denies any loose teeth              Relevant Problems   GU/RENAL   (+) ESRD (end stage renal disease) on dialysis               Anesthesia Plan    ASA 3     general               (Pt is DNR but we discussed use of LMA or ETT and possibly pressors intraop and she signed a paper authorizing that.  No compressions or defibrillations)      intravenous induction   Detailed anesthesia plan: general LMA      Post Op: plan for postoperative opioid use    Post op pain management: per surgeon and PO analgesics    informed consent obtained    Plan discussed with CRNA.    ECG reviewed  pertinent labs reviewed  imaging results reviewed           Signed by: Roselee Nova 05/27/19 10:45 PM

## 2019-05-27 NOTE — Progress Notes (Signed)
[facility] ([place])    Received a referral to schedule a follow up appointment with the [facility].  Appointment scheduled for Friday 05/30/19 at 11:00am with Dr. Dion Body at [place] location.      Clinic address is as follows:      [address] [place], [place]      Please notify patient to arrive 15 minutes early to the appointment and bring the following materials with them:    Insurance card (if insured) and photo ID  Medications in their original bottles  Glucometer/blood sugar log (if diabetic)  Weight log (if heart failure)  Proof of income (to enroll in medication assistance programs-first two pages of signed 1040 tax forms or last 2 months of pay stubs)      Derinda Sis  Transitional Services   Sched Reg Rep II  T [phone]  F [phone]

## 2019-05-27 NOTE — ED Provider Notes (Signed)
EMERGENCY DEPARTMENT HISTORY AND PHYSICAL EXAM     None        Date: 05/27/2019  Patient Name: [name]    History of Presenting Illness     Chief Complaint   Patient presents with    Abdominal Pain       History Provided By: Patient    Chief Complaint: abd pain, diarrhea  Duration: since yesterday morning  Timing:  Constant  Location: L side abd  Quality: uncomfortable  Severity: Severe      Additional History: [name] is a 64 y.o. female presenting to the ED with hx ESRD (was on PD-->switched to HD while in hospital) [place]-ed yesterday from hospital for E.Coli peritonitis here for L sided abd pain with >10 episodes watery diarrhea since yesterday. +n/v x 2 with watery material, no blood. Denies fevers, cp, sob, dysuria. Last had HD yesterday in hospital. Still has peritoneal dialysis catheter in place      PCP: Pcp, None, MD  SPECIALISTS:    Current Facility-Administered Medications   Medication Dose Route Frequency Provider Last Rate Last Admin    cefepime (MAXIPIME) 1 g in sodium chloride 0.9 % 100 mL IVPB mini-bag plus  1 g Intravenous Once Burnett Corrente, MD 200 mL/hr at 05/27/19 0622 1 g at 05/27/19 4709    metroNIDAZOLE (FLAGYL) 500 mg IVPB (premix)  500 mg Intravenous Once Burnett Corrente, MD        sodium chloride 0.9 % bolus 500 mL  500 mL Intravenous Once Burnett Corrente, MD 500 mL/hr at 05/27/19 0603 500 mL at 05/27/19 0603    vancomycin (VANCOCIN) 1,750 mg in sodium chloride 0.9 % 500 mL IVPB  15 mg/kg Intravenous Once Burnett Corrente, MD         Current Outpatient Medications   Medication Sig Dispense Refill    amLODIPine (NORVASC) 5 MG tablet Take 1 tablet (5 mg total) by mouth every 12 (twelve) hours      atorvastatin (LIPITOR) 40 MG tablet Take 40 mg by mouth daily      carvedilol (COREG) 25 MG tablet Take 25 mg by mouth      famotidine (PEPCID) 40 MG tablet Take 40 mg by mouth daily      furosemide (LASIX) 40 MG tablet Take 40 mg by mouth 2 (two) times daily      gabapentin (NEURONTIN)  600 MG tablet Take 600 mg by mouth 3 (three) times daily      insulin aspart (NovoLOG) 100 UNIT/ML injection Inject into the skin 3 (three) times daily before meals      insulin glargine (LANTUS) 100 UNIT/ML injection Inject 10 Units into the skin every morning      oxyCODONE-acetaminophen (PERCOCET) 5-325 MG per tablet Take 1 tablet by mouth every 4 (four) hours as needed for Pain      pantoprazole (PROTONIX) 40 MG tablet Take 1 tablet (40 mg total) by mouth 2 (two) times daily      sevelamer (RENVELA) 800 MG tablet Take 800 mg by mouth 3 (three) times daily with meals         Past History     Past Medical History:  Past Medical History:   Diagnosis Date    Chronic obstructive pulmonary disease     Diabetes mellitus     Hyperlipidemia     Hypertension     Sleep apnea 2018       Past Surgical History:  Past Surgical History:   Procedure Laterality  Date    EGD, BIOPSY N/A 05/14/2019    Procedure: EGD, BIOPSY;  Surgeon: Ronie Spies, MD;  Location: [facility];  Service: Gastroenterology;  Laterality: N/A;    JOINT REPLACEMENT      shoulder    JOINT REPLACEMENT      knee    OVARY SURGERY Left     Removal    TUNNELED CATH PLACEMENT (PERMCATH) Bilateral 05/23/2019    Procedure: TUNNELED CATH PLACEMENT;  Surgeon: Jacques Earthly, MD;  Location: [facility];  Service: Interventional Radiology;  Laterality: Bilateral;       Family History:  Family History   Problem Relation Age of Onset    Diabetes Mother     Hypertension Mother     Diabetes Father     Hypertension Father     Diabetes Sister     Hypertension Sister     Diabetes Brother     Hypertension Brother     Diabetes Paternal Aunt     Diabetes Paternal Uncle        Social History:  Social History     Tobacco Use    Smoking status: Never Smoker    Smokeless tobacco: Never Used   Substance Use Topics    Alcohol use: Never    Drug use: Never       Allergies:  Allergies   Allergen Reactions    Lisinopril Hives    Metformin Hives        Review of Systems     Review of Systems    Physical Exam   BP 184/78    Pulse 84    Temp 99.8 F (37.7 C)    Resp 19    Ht 5\' 2"  (1.575 m)    Wt 113.4 kg    LMP  (LMP Unknown) Comment: post menopausal   SpO2 96%    BMI 45.73 kg/m   Physical Exam   Constitutional: Patient is oriented to person, place, and time and well-developed, well-nourished, and in mod discomfort. Elevated bmi  Head: Normocephalic and atraumatic.   Eyes: EOM are normal. Pupils are equal, round, and reactive to light. pink subconj. No scleral icterus  ENT: OP clear, sli dry mm  Neck: Normal range of motion. Neck supple. No JVD  Cardiovascular: Normal rate and regular rhythm. No murmurs or rubs.  Pulmonary/Chest: Effort normal and breath sounds normal. No respiratory distress.   Abdominal: Soft. ttp diffusely, most in LLQ. No rebound or guarding. Peritoneal dialysis catheter c/d/i  Musculoskeletal: Normal range of motion. No lower extremity edema.  Neurological: Patient is alert and oriented to person, place, and time. GCS score is 15.   Skin: Skin is warm and dry.         Diagnostic Study Results     Labs -     Results     Procedure Component Value Units Date/Time    COVID-19 (SARS-COV-2) [employer] [facility]) [[id number]] Collected: 05/27/19 0624    Specimen: Nasopharyngeal Swab from Nasopharynx Updated: 05/27/19 0630     Purpose of COVID testing Screening     SARS-CoV-2 Specimen Source Nasopharyngeal    Narrative:      o Collect and clearly label specimen type:  o Upper respiratory specimen: One Nasopharyngeal Dry Swab NO  Transport Media.  o Hand deliver to laboratory ASAP  Indication for testing->Extended care facility admission to  semi private room    Culture + Gram [facility] Body Fluid [[id number]] Collected: 05/27/19 0534    Specimen: Body Fluid  from Paracentesis Fluid Updated: 05/27/19 0535    GFR [[id number]] Collected: 05/27/19 0507     Updated: 05/27/19 0534     EGFR 19.0    Narrative:      Replace urinary catheter prior to obtaining  the urine culture  if it has been in place for greater than or equal to 14  days:->N/A No Foley  Indications for U/A Reflex to Micro - Reflex to  Culture:->Suprapubic Pain/Tenderness or Dysuria    Comprehensive metabolic panel [[id number]]  (Abnormal) Collected: 05/27/19 0507    Specimen: Blood Updated: 05/27/19 0534     Glucose 155 mg/dL      BUN 13.0 mg/dL      Creatinine 3.0 mg/dL      Sodium 139 mEq/L      Potassium 4.1 mEq/L      Chloride 103 mEq/L      CO2 28 mEq/L      Calcium 7.8 mg/dL      Protein, Total 5.4 g/dL      Albumin 2.1 g/dL      AST (SGOT) 9 U/L      ALT <6 U/L      Alkaline Phosphatase 108 U/L      Bilirubin, Total 1.0 mg/dL      Globulin 3.3 g/dL      Albumin/Globulin Ratio 0.6     Anion Gap 8.0    Narrative:      Replace urinary catheter prior to obtaining the urine culture  if it has been in place for greater than or equal to 14  days:->N/A No Foley  Indications for U/A Reflex to Micro - Reflex to  Culture:->Suprapubic Pain/Tenderness or Dysuria    Lipase [[id number]] Collected: 05/27/19 0507    Specimen: Blood Updated: 05/27/19 0534     Lipase 21 U/L     Narrative:      Replace urinary catheter prior to obtaining the urine culture  if it has been in place for greater than or equal to 14  days:->N/A No Foley  Indications for U/A Reflex to Micro - Reflex to  Culture:->Suprapubic Pain/Tenderness or Dysuria    Hemolysis index [[id number]] Collected: 05/27/19 0507     Updated: 05/27/19 0534     Hemolysis Index 1    Narrative:      Replace urinary catheter prior to obtaining the urine culture  if it has been in place for greater than or equal to 14  days:->N/A No Foley  Indications for U/A Reflex to Micro - Reflex to  Culture:->Suprapubic Pain/Tenderness or Dysuria    Lactic Acid [[id number]] Collected: 05/27/19 0507    Specimen: Blood Updated: 05/27/19 0521     Lactic Acid 1.1 mmol/L     CBC and differential [[id number]]  (Abnormal) Collected: 05/27/19 0507    Specimen: Blood Updated: 05/27/19 0520      WBC 15.22 x10 3/uL      Hgb 10.4 g/dL      Hematocrit 34.1 %      Platelets 165 x10 3/uL      RBC 3.54 x10 6/uL      MCV 96.3 fL      MCH 29.4 pg      MCHC 30.5 g/dL      RDW 13 %      MPV 11.6 fL      Neutrophils 86.8 %      Lymphocytes Automated 5.8 %      Monocytes 6.4 %      Eosinophils Automated 0.2 %  Basophils Automated 0.3 %      Immature Granulocytes 0.5 %      Nucleated RBC 0.1 /100 WBC      Neutrophils Absolute 13.21 x10 3/uL      Lymphocytes Absolute Automated 0.88 x10 3/uL      Monocytes Absolute Automated 0.98 x10 3/uL      Eosinophils Absolute Automated 0.03 x10 3/uL      Basophils Absolute Automated 0.04 x10 3/uL      Immature Granulocytes Absolute 0.08 x10 3/uL      Absolute NRBC 0.02 x10 3/uL     Narrative:      Replace urinary catheter prior to obtaining the urine culture  if it has been in place for greater than or equal to 14  days:->N/A No Foley  Indications for U/A Reflex to Micro - Reflex to  Culture:->Suprapubic Pain/Tenderness or Dysuria    Blood Culture Aerobic/Anaerobic #2 [[id number]] Collected: 05/27/19 0507    Specimen: Arm from Blood, Venipuncture Updated: 05/27/19 0507    Narrative:      1 BLUE+1 PURPLE    Blood Culture Aerobic/Anaerobic #1 [[id number]] Collected: 05/27/19 0507    Specimen: Arm from Blood, Venipuncture Updated: 05/27/19 0507    Narrative:      1 BLUE+1 PURPLE    Body Fluid Cell Count [[id number]] Resulted: 05/27/19 0507    Specimen: Body Fluid Updated: 05/27/19 0507     Body Fluid Source: Peritoneal Flui          Radiologic Studies -   Radiology Results (24 Hour)     Procedure Component Value Units Date/Time    CT Abd/Pelvis with IV Contrast only [[id number]] Collected: 05/27/19 0602    Order Status: Completed Updated: 05/27/19 0609    Narrative:      CT ABDOMEN PELVIS W IV/ WO PO CONT      HISTORY: abd pain worst on L side. ESRD on HD.    COMPARISON: 05/12/2019.    TECHNIQUE: Multiple axial CT images were obtained through the abdomen  and pelvis after the  administration of 130 cc of Visipaque 320  intravenous contrast. Oral contrast was not given. Coronal and sagittal  MPR  images were reconstructed. One of the following dose optimization  techniques was utilized in the performance of this exam: Automated  exposure control; adjustment of the mA and/or kV according to the  patient's size; or use of an iterative  reconstruction technique.   Specific details can be referenced in the facility's radiology CT exam  operational policy.    FINDINGS:    Lung bases:  Trace bilateral pleural effusions are present. Small hiatal  hernia is stable.    Hepatobiliary: The liver is normal. The gallbladder is normal. The  common bile duct is normal in size.     Spleen: Normal.    Pancreas: Normal.    Adrenals/Kidneys: The adrenal glands are normal. Kidneys are normal. No  stones or hydronephrosis.    Bowel/Mesentery/peritoneum: Appendix is normal. Peritoneal dialysis  catheter is present. There are fluid-filled mildly distended loops of  small bowel with relatively decompressed distal small bowel loops.  Transition point is not identified. There is fluid in the colon. No free  air. Mild free fluid is present and similar to the prior study.    Lymph nodes: No enlarged lymph nodes.    Vessels: Moderate vascular calcifications are present.    Pelvic GU: The bladder is normal. Calcified uterine fibroids are seen.    Other: Negative.  Musculoskeletal: Degenerative changes are present in the spine.      Impression:        1. Fluid-filled mildly distended loops of small bowel with distal  decompressed small bowel loops. Findings may represent early or partial  small bowel obstruction. Enteritis is felt to be less likely.  2. Trace bilateral pleural effusions.  3. Additional chronic findings as above.    Delaney Meigs, MD   05/27/2019 6:07 AM      .    Medical Decision Making   I am the first provider for this patient.    I reviewed the vital signs, available nursing notes, past medical history,  past surgical history, family history and social history.    Vital Signs-Reviewed the patient's vital signs.     Patient Vitals for the past 12 hrs:   BP Temp Pulse Resp   05/27/19 0610 184/78 -- 84 19   05/27/19 0436 141/72 99.8 F (37.7 C) 82 16       Pulse Oximetry Analysis - Normal 97% on RA    EKG:  Interpreted by the EP.   Time Interpreted: 5:16   Rate: 78   Rhythm: Normal Sinus Rhythm     Interpretation: no st elev or depressions        Old Medical Records: Old medical records. Admitted 05/12/2019-05/26/2019 for E.coli peritonitis (tx with ancef total x 3 wks), UGIB. ESRD on PD. Delay in hospital [place] due to waiting for SNF placement and then pt decided she wanted to go home. Tunneled dialysis catheter placed 4/30 as pt wanted to switch to HD. EGD on 05/14/2019 with grade D gastritis. tx with protonix. cta chest neg for pe, tn neg x 2     ED Course:   Pt states she still has pain after morphine 8 mg. Agrees with admission.    ED nurse states PD catheter is difficult to instill and only returned a small amount of fluid. Ordered cell count and gs/cx but not enough for both tests so lab to run gs/cx    Pt unable to give stool sample yet    6:40a- d/w Dr. Genia Harold who accepts to obs remote tele. Requests gen surg consult    7:40a- d/w Dr. Dellis Anes ([facility]) who will consult    Provider Notes (Initial Assessment): 64 yo woman with ESRD (formerly on PD, recently switched to HD) [place]-ed yesterday after admission for E.coli peritonitis (ancef tx) here for L sided abd pain and watery diarrhea since yesterday. No fevers. +n/v without blood. Had HD yesterday. Afebrile, nontoxic, diffuse abd ttp. wbc elevated. Will send bcx, peritoneal fluid if we can collect, check abd ct, stool studies for Cdiff.Prov          Diagnosis     Clinical Impression:   1. Generalized abdominal pain    2. Vomiting and diarrhea    3. End stage renal disease on dialysis    4. Peritoneal dialysis catheter in place    5. Partial small bowel obstruction         Treatment Plan:   ED Disposition     ED Disposition Condition Date/Time Comment    Observation  Tue May 27, 2019  6:42 AM Admitting Physician: Sheppard Coil [[id number]]   Service:: Medicine [[id number]]   Estimated Length of Stay: < 2 midnights   Tentative Discharge Plan?: Home or Self Care [1]   Does patient need telemetry?: Yes   Telemetry type (separate Telemetry order is also required):: Medical telemetry  _______________________________  Attestations:     None        I am the first provider for this patient.    Burnett Corrente, MD is the primary emergency doctor of record.    I reviewed the vital signs, available nursing notes, past medical history, past surgical history, family history and social history.       Burnett Corrente, MD  05/27/19 [phone]

## 2019-05-27 NOTE — UM Notes (Signed)
[facility] Utilization Review  NPI: [id number], Tax ID: [id number]  Please Call Janalyn Rouse @ [phone] with any questions or concerns. Confidential voicemail.  Email: [email or web address]   Fax final authorizations and requests for additional information to [phone]      05/27/19 [phone]    Admit to Observation Once   Comments: Remote tele  Diagnosis: Generalized Abdominal Pain   Level of Care: Acute   Admitting Physician Sheppard Coil     05/27/19 Medicine Assessment/Plan:   64 y.o. female with a PMHx of  hypertension, hyperlipidemia, insulin-dependent diabetes mellitus, COPD, end-stage renal disease on peritoneal dialysis   Recently switched to hemodialysis and discharged yesterday return to the ED with recurrent abdominal pain associated with Non bloody diarrhea, nausea vomiting of nonbilious and nonbloody vomitus.  Patient states that she continued to have loose watery stools on arriving at home yesterday followed by nausea vomiting 2-3 times, described abdominal pain as left-sided pain intensity greater than 10, pain is worse with palpation.  She had dialysis yesterday prior to discharge and have not arranged transportation for outpatient dialysis yet.    Patient Active Hospital Problem List:  Generalized abdominal pain (05/27/2019)  Nausea vomiting diarrhea/gastroenteritis  End-stage renal disease on dialysis Tuesdays Thursdays and Saturdays  Partial small bowel obstruction  Leukocytosis           Plan:   CT abdomen pelvis showed partial small bowel obstruction, trace bilateral pleural effusions, revealed WBC of 15.22, H&H of 10.4/34.1, stool studies pending, infectious disease Dr. Lahoma Crocker consulted.    Abdominal pain likely due to viral gastroenteritis versus C. difficile infection, versus lactulose induced.    Patient has been using IV antibiotic and was discharged on IV cefazolin  Lipase is normal, lactic acid normal  Patient received a dose of vancomycin, Flagyl and Ancef in the  ED  Infectious disease Dr. Lahoma Crocker is on board who will see patient today continue morphine as needed for severe pain  As well as Percocet for moderate pain    ESRD on peritoneal dialysis  -Started on dialysis on 05/2018  -CT abd/pelvis showed trace bilateral pleural effusions ? Dialysis fluid  Patient still has peritoneal dialysis access on the left side of her abdomen  We will consult CV IR for possible removal.      Hypertension  - continue home amlodipine, coreg    Hyperglycemia  Type 2 DM  -Continue home lantus, SSI, ac/hs accuchecks  -Check A1c    Hyperlipidemia   -Continue home statin       Nutrition  NPO    DVT/VTE Prophylaxis  SCDs    Anticipated medical stability for discharge: TBD        05/27/19 0436 --99.8  82 97 % -- 16 141/72     2L O2 NC      05/27/2019 05:07  WBC: 15.22   Hemoglobin: 10.4   Hematocrit: 34.1     05/27/2019 05:07  Creatinine: 3.0       05/27/2019 05:07  Calcium: 7.8     05/27/19 CT abdomen:  IMPRESSION: 1. Fluid-filled mildly distended loops of small bowel with distal  decompressed small bowel loops. Findings may represent early or partial  small bowel obstruction. Enteritis is felt to be less likely.  2. Trace bilateral pleural effusions.  3. Additional chronic findings as above.    Scheduled Meds:  Current Facility-Administered Medications   Medication Dose Route Frequency    amLODIPine  5 mg Oral Q12H    atorvastatin  40  mg Oral Daily    carvedilol  25 mg Oral Q12H [place]    ceFAZolin  2 g Intravenous Daily after dinner    darbepoetin alfa  60 mcg Subcutaneous Weekly    [START ON 05/28/2019] insulin glargine  10 Units Subcutaneous QAM    insulin lispro  1-8 Units Subcutaneous TID AC    pantoprazole  40 mg Oral QAM AC    sevelamer  800 mg Oral TID MEALS    vancomycin  125 mg Oral Q6H     Continuous Infusions:   sodium chloride      lactated ringers             cefepime (MAXIPIME) 1 g in sodium chloride 0.9 % 100 mL IVPB mini-bag plus   Dose: 1 g  Freq: Once Route: IV    05/27/19 0614    morphine injection 4 mg   Dose: 4 mg   Route: IV 5/4 x 2 doses at 0521 and 0603    ondansetron (ZOFRAN) injection 4 mg   Dose: 4 mg  Freq: Once Route: IV   05/27/19 0507     sodium chloride 0.9 % bolus 500 mL   Dose: 500 mL  Freq: Once Route: IV  05/27/19 0600

## 2019-05-27 NOTE — Consults (Signed)
@[email or web address] @   [place] [phone]      Consultation Date Time: 05/27/19 3:42 PM  Date of Admission: 05/27/2019     Requesting Physician: Ardeth Sportsman, MD  Consulting Physician: Kalman Jewels MD  Consulting Service: General Surgery    Reason For Consultation:  Abdominal Pain      Impression:     Patient Active Problem List   Diagnosis    Iron deficiency anemia secondary to inadequate dietary iron intake    Sideropenic dysphagia    Peritoneal dialysis catheter dysfunction, initial encounter    Peritonitis    Upper GI bleed    ESRD (end stage renal disease) on dialysis    Generalized abdominal pain       Plan:   NPO for PD cath removal, peritoneal cultures  Continue abx per ID      History of Present Illness:   [name] is a 64 y.o. female who  has a past medical history of Chronic obstructive pulmonary disease, Diabetes mellitus, Hyperlipidemia, Hypertension, and Sleep apnea (2018).. She presents with 1 day of acute abdominal pain that started on the left side. Associated with n/v/ and diarrhea. Denies fevers, chills. Currently passing flatus and non-blood stool, diarrhea has improved.    Past Medical History:     Past Medical History:   Diagnosis Date    Chronic obstructive pulmonary disease     Diabetes mellitus     Hyperlipidemia     Hypertension     Sleep apnea 2018       Past Surgical History:     Past Surgical History:   Procedure Laterality Date    EGD, BIOPSY N/A 05/14/2019    Procedure: EGD, BIOPSY;  Surgeon: Ronie Spies, MD;  Location: [facility];  Service: Gastroenterology;  Laterality: N/A;    JOINT REPLACEMENT      shoulder    JOINT REPLACEMENT      knee    OVARY SURGERY Left     Removal    TUNNELED CATH PLACEMENT (PERMCATH) Bilateral 05/23/2019    Procedure: TUNNELED CATH PLACEMENT;  Surgeon: Jacques Earthly, MD;  Location: [facility];  Service: Interventional Radiology;  Laterality: Bilateral;       Family History:     Family History   Problem  Relation Age of Onset    Diabetes Mother     Hypertension Mother     Diabetes Father     Hypertension Father     Diabetes Sister     Hypertension Sister     Diabetes Brother     Hypertension Brother     Diabetes Paternal Aunt     Diabetes Paternal Uncle        Social History:     Social History     Socioeconomic History    Marital status: Legally Separated     Spouse name: Not on file    Number of children: Not on file    Years of education: Not on file    Highest education level: Not on file   Occupational History    Not on file   Tobacco Use    Smoking status: Never Smoker    Smokeless tobacco: Never Used   Substance and Sexual Activity    Alcohol use: Never    Drug use: Never    Sexual activity: Not Currently   Other Topics Concern    Not on file   Social History Narrative    Not on file  Social Determinants of Health     Financial Resource Strain:     Difficulty of Paying Living Expenses:    Food Insecurity:     Worried About Charity fundraiser in the Last Year:     Arboriculturist in the Last Year:    Transportation Needs:     Film/video editor (Medical):     Lack of Transportation (Non-Medical):    Physical Activity:     Days of Exercise per Week:     Minutes of Exercise per Session:    Stress:     Feeling of Stress :    Social Connections:     Frequency of Communication with Friends and Family:     Frequency of Social Gatherings with Friends and Family:     Attends Religious Services:     Active Member of Clubs or Organizations:     Attends Archivist Meetings:     Marital Status:    Intimate Partner Violence:     Fear of Current or Ex-Partner:     Emotionally Abused:     Physically Abused:     Sexually Abused:        Allergies:     Allergies   Allergen Reactions    Lisinopril Hives    Metformin Hives       Medications:     Prior to Admission medications    Medication Sig Start Date End Date Taking? Authorizing Provider   amLODIPine (NORVASC) 5 MG  tablet Take 1 tablet (5 mg total) by mouth every 12 (twelve) hours 05/20/19   Matiana, Rollen Sox, MD   atorvastatin (LIPITOR) 40 MG tablet Take 40 mg by mouth daily    [provider]   carvedilol (COREG) 25 MG tablet Take 25 mg by mouth    [provider]   famotidine (PEPCID) 40 MG tablet Take 40 mg by mouth daily    [provider]   furosemide (LASIX) 40 MG tablet Take 40 mg by mouth 2 (two) times daily    [provider]   gabapentin (NEURONTIN) 600 MG tablet Take 600 mg by mouth 3 (three) times daily    [provider]   insulin aspart (NovoLOG) 100 UNIT/ML injection Inject into the skin 3 (three) times daily before meals    [provider]   insulin glargine (LANTUS) 100 UNIT/ML injection Inject 10 Units into the skin every morning 05/27/19   Matiana, Rollen Sox, MD   oxyCODONE-acetaminophen (PERCOCET) 5-325 MG per tablet Take 1 tablet by mouth every 4 (four) hours as needed for Pain    [provider]   pantoprazole (PROTONIX) 40 MG tablet Take 1 tablet (40 mg total) by mouth 2 (two) times daily 05/20/19   Dolan Amen D, MD   sevelamer (RENVELA) 800 MG tablet Take 800 mg by mouth 3 (three) times daily with meals    [provider]       Review of Systems:   As per the HPI.  The patient denies any additional changes to their otic, opthalmologic, dermatologic, pulmonary, cardiac, gastrointestinal, genitourinary, musculoskeletal, hematologic, constitutional, or psychiatric systems.      Physical Exam:     Vitals:    05/27/19 1154   BP: 152/74   Pulse: 81   Resp: 16   Temp: 99.7 F (37.6 C)   SpO2: 92%     PHYSICAL EXAM:  General: AAOx3, NAD  CV: RRR  Pulm: CTAB  Abdomen:  Soft, mildly tender in all four quadrants, greatest on the left side by the peritoneal cath.  Extremities: peripheral pulses intact, no edema  Neuro: Motor and sensory grossly intact      Labs:     Results     Procedure Component Value Units Date/Time    Culture + Gram  Stain,Aerobic, Body Fluid [[id number]] Collected: 05/27/19 0534    Specimen: Body Fluid from Paracentesis Fluid Updated: 05/27/19 1431    Narrative:      [id number] called Micro Results of Gram Stain. Results read back by: [id number], by  [id number] on 05/27/2019 at 14:31  < 21ml  ORDER#: [id number]                                    ORDERED BY: CHUNG, SORA  SOURCE: Paracentesis Fluid peritoneal dialysis cathetCOLLECTED:  05/27/19 05:34  ANTIBIOTICS AT COLL.:                                RECEIVED :  05/27/19 10:09  ORDER ENTRY COMMENTS:  < 50ml  [id number] called Micro Results of Gram Stain. Results read back by: [id number], by [id number] on 05/27/2019 at 14:31  Stain, Gram                                FINAL       05/27/19 14:31  05/27/19   No Squamous epithelial cells seen             No WBCs seen             Rare Gram positive cocci             Stain performed on Cytospin (concentrated) specimen  Culture and Gram Stain, Aerobic, Body FluidPENDING      Glucose Whole Blood - POCT [[id number]]  (Abnormal) Collected: 05/27/19 1154     Updated: 05/27/19 1229     Whole Blood Glucose POCT 170 mg/dL     Blood Culture Aerobic/Anaerobic #1 [[id number]] Collected: 05/27/19 0507    Specimen: Arm from Blood, Venipuncture Updated: 05/27/19 0950    Narrative:      1 BLUE+1 PURPLE    Blood Culture Aerobic/Anaerobic #2 [[id number]] Collected: 05/27/19 0507    Specimen: Arm from Blood, Venipuncture Updated: 05/27/19 0950    Narrative:      1 BLUE+1 PURPLE    Glucose Whole Blood - POCT [[id number]]  (Abnormal) Collected: 05/27/19 0900     Updated: 05/27/19 0903     Whole Blood Glucose POCT 164 mg/dL     COVID-19 (SARS-COV-2) ([place] [facility]) [[id number]] Collected: 05/27/19 0624    Specimen: Nasopharyngeal Swab from Nasopharynx Updated: 05/27/19 0656     Purpose of COVID testing Screening     SARS-CoV-2 Specimen Source Nasopharyngeal     SARS CoV 2 Overall Result Negative    Narrative:      o Collect and clearly label specimen type:  o Upper respiratory specimen:  One Nasopharyngeal Dry Swab NO  Transport Media.  o Hand deliver to laboratory ASAP  Indication for testing->Extended care facility admission to  semi private room    GFR [[id number]] Collected: 05/27/19 0507     Updated: 05/27/19 0534     EGFR 19.0    Narrative:      Replace urinary catheter prior  to obtaining the urine culture  if it has been in place for greater than or equal to 14  days:->N/A No Foley  Indications for U/A Reflex to Micro - Reflex to  Culture:->Suprapubic Pain/Tenderness or Dysuria    Comprehensive metabolic panel [[id number]]  (Abnormal) Collected: 05/27/19 0507    Specimen: Blood Updated: 05/27/19 0534     Glucose 155 mg/dL      BUN 13.0 mg/dL      Creatinine 3.0 mg/dL      Sodium 139 mEq/L      Potassium 4.1 mEq/L      Chloride 103 mEq/L      CO2 28 mEq/L      Calcium 7.8 mg/dL      Protein, Total 5.4 g/dL      Albumin 2.1 g/dL      AST (SGOT) 9 U/L      ALT <6 U/L      Alkaline Phosphatase 108 U/L      Bilirubin, Total 1.0 mg/dL      Globulin 3.3 g/dL      Albumin/Globulin Ratio 0.6     Anion Gap 8.0    Narrative:      Replace urinary catheter prior to obtaining the urine culture  if it has been in place for greater than or equal to 14  days:->N/A No Foley  Indications for U/A Reflex to Micro - Reflex to  Culture:->Suprapubic Pain/Tenderness or Dysuria    Lipase [[id number]] Collected: 05/27/19 0507    Specimen: Blood Updated: 05/27/19 0534     Lipase 21 U/L     Narrative:      Replace urinary catheter prior to obtaining the urine culture  if it has been in place for greater than or equal to 14  days:->N/A No Foley  Indications for U/A Reflex to Micro - Reflex to  Culture:->Suprapubic Pain/Tenderness or Dysuria    Hemolysis index [[id number]] Collected: 05/27/19 0507     Updated: 05/27/19 0534     Hemolysis Index 1    Narrative:      Replace urinary catheter prior to obtaining the urine culture  if it has been in place for greater than or equal to 14  days:->N/A No Foley  Indications for U/A  Reflex to Micro - Reflex to  Culture:->Suprapubic Pain/Tenderness or Dysuria    Lactic Acid [[id number]] Collected: 05/27/19 0507    Specimen: Blood Updated: 05/27/19 0521     Lactic Acid 1.1 mmol/L     CBC and differential [[id number]]  (Abnormal) Collected: 05/27/19 0507    Specimen: Blood Updated: 05/27/19 0520     WBC 15.22 x10 3/uL      Hgb 10.4 g/dL      Hematocrit 34.1 %      Platelets 165 x10 3/uL      RBC 3.54 x10 6/uL      MCV 96.3 fL      MCH 29.4 pg      MCHC 30.5 g/dL      RDW 13 %      MPV 11.6 fL      Neutrophils 86.8 %      Lymphocytes Automated 5.8 %      Monocytes 6.4 %      Eosinophils Automated 0.2 %      Basophils Automated 0.3 %      Immature Granulocytes 0.5 %      Nucleated RBC 0.1 /100 WBC      Neutrophils Absolute 13.21 x10 3/uL  Lymphocytes Absolute Automated 0.88 x10 3/uL      Monocytes Absolute Automated 0.98 x10 3/uL      Eosinophils Absolute Automated 0.03 x10 3/uL      Basophils Absolute Automated 0.04 x10 3/uL      Immature Granulocytes Absolute 0.08 x10 3/uL      Absolute NRBC 0.02 x10 3/uL     Narrative:      Replace urinary catheter prior to obtaining the urine culture  if it has been in place for greater than or equal to 14  days:->N/A No Foley  Indications for U/A Reflex to Micro - Reflex to  Culture:->Suprapubic Pain/Tenderness or Dysuria    Body Fluid Cell Count [[id number]] Resulted: 05/27/19 0507    Specimen: Body Fluid Updated: 05/27/19 0507     Body Fluid Source: Peritoneal Flui          Rads:     Radiology Results (24 Hour)     Procedure Component Value Units Date/Time    CT Abd/Pelvis with IV Contrast only [[id number]] Collected: 05/27/19 0602    Order Status: Completed Updated: 05/27/19 0609    Narrative:      CT ABDOMEN PELVIS W IV/ WO PO CONT      HISTORY: abd pain worst on L side. ESRD on HD.    COMPARISON: 05/12/2019.    TECHNIQUE: Multiple axial CT images were obtained through the abdomen  and pelvis after the administration of 130 cc of Visipaque 320  intravenous  contrast. Oral contrast was not given. Coronal and sagittal  MPR  images were reconstructed. One of the following dose optimization  techniques was utilized in the performance of this exam: Automated  exposure control; adjustment of the mA and/or kV according to the  patient's size; or use of an iterative  reconstruction technique.   Specific details can be referenced in the facility's radiology CT exam  operational policy.    FINDINGS:    Lung bases:  Trace bilateral pleural effusions are present. Small hiatal  hernia is stable.    Hepatobiliary: The liver is normal. The gallbladder is normal. The  common bile duct is normal in size.     Spleen: Normal.    Pancreas: Normal.    Adrenals/Kidneys: The adrenal glands are normal. Kidneys are normal. No  stones or hydronephrosis.    Bowel/Mesentery/peritoneum: Appendix is normal. Peritoneal dialysis  catheter is present. There are fluid-filled mildly distended loops of  small bowel with relatively decompressed distal small bowel loops.  Transition point is not identified. There is fluid in the colon. No free  air. Mild free fluid is present and similar to the prior study.    Lymph nodes: No enlarged lymph nodes.    Vessels: Moderate vascular calcifications are present.    Pelvic GU: The bladder is normal. Calcified uterine fibroids are seen.    Other: Negative.    Musculoskeletal: Degenerative changes are present in the spine.      Impression:        1. Fluid-filled mildly distended loops of small bowel with distal  decompressed small bowel loops. Findings may represent early or partial  small bowel obstruction. Enteritis is felt to be less likely.  2. Trace bilateral pleural effusions.  3. Additional chronic findings as above.    Diane Meigs, MD   05/27/2019 6:07 AM          Signed by: Sharon Seller

## 2019-05-27 NOTE — OT Eval Note (Addendum)
[facility]   Occupational Therapy Attempt Note    Patient:  [name] MRN#:  [id number]  Unit:  [address] [place] Room/Bed:  F7902/I0973.B      Occupational Therapy evaluation attempted on 05/27/2019 at 2:03 PM unable to complete secondary to per discussion with PT, pt will benefit from rest break following PT Eval. Will attempt to f/u later today as able.           [place], [place], OTR/L [phone]  05/27/2019 2:03 PM  ---------------------------------------------------------------------------------------------  [facility]   Occupational Therapy Attempt Note    Patient:  [name] MRN#:  [id number]  Unit:  [address] [place] Room/Bed:  A8341/D6222.B      Occupational Therapy evaluation attempted on 05/27/2019 at 4:10 PM unable to complete secondary to patient declining 2/2 to increased pain. Therapist will reattempt tomorrow per pt request. RN reports can modify OT orders to begin tomorrow.     RN aware.        [place], [place], OTR/L [phone]  05/27/2019 4:10 PM

## 2019-05-27 NOTE — Progress Notes (Signed)
Patient new to dialysis, new OP HD was arranged at [facility], TTS 12:30pm, pt was planned to start on Thurs 5/6.  Copy of H&P faxed to [facility] for their files.  Writer will coordinate with OP HD clinic when pt is ready for d/c.    Freida Busman, HD Care Coordinator  [phone]

## 2019-05-27 NOTE — ED Triage Notes (Signed)
[name] is a 64 y.o. female coming from home w/ abdominal pain that has continued from when she was discharged yesterday. Pt says she left the hospital in pain. Pt went to dialysis today. Pt is complaining of lower left and lower right shooting abdominal pain.

## 2019-05-27 NOTE — Progress Notes (Signed)
Case Management Initial Assessment and Discharge Planning:    Situation Abdominal Pain     Background   Past Medical History:  Diagnosis Date   Chronic obstructive pulmonary disease    Diabetes mellitus    Hyperlipidemia    Hypertension    Sleep apnea 2018      Assessment Pt is a 64 y/o female who presented with Abdominal Pain after being d/c earlier in the day.  Pt reports she has been in a lot of pain and now has difficulty ambulating.  Pt has a history of PD and was recently switched to HD and was set up with [facility].  PT attempted to evaluate, however, pt complaint about pain.  D/C likely SNF.  CM will make referral to TCM and follow up with Dialysis Liaison.  SW will continue to follow.     Recommendation D/C HH vs. SNF               05/27/19 [place] Decisions   Interviewed: Patient   Orientation/Decision Making Abilities of Patient Alert and Oriented x3, able to make decisions   Prior to admission   Prior level of function Independent with ADLs;Ambulates with assistive device   Type of Residence Private residence   Home Layout One level   Have running water, electricity, heat, etc? Yes   Living Arrangements Spouse/significant other   How do you get to your MD appointments? self   How do you get your groceries? self   Who fixes your meals? self   Who does your laundry? self   Who picks up your prescriptions? self   Dressing Independent   Grooming Independent   Feeding Independent   Bathing Independent   Toileting Independent   Discharge Planning   Support Systems Children   Anticipated [place] plan discussed with: Same as interviewed   Mode of transportation: Private car (family member)   Does the patient have perscription coverage? Yes   Consults/Providers   PT Evaluation Needed 1   OT Evalulation Needed 2   SLP Evaluation Needed 2   Outcome Palliative Care Screen Screened but did not meet criteria for intervention   Family and PCP   PCP on file was verified as the current PCP?  Patient/family states they do not have a PCP   Important Message from Medicare Notice   Patient received 1st IMM Letter? n/a

## 2019-05-27 NOTE — PT Eval Note (Signed)
[facility]  Physical Therapy Evaluation    Patient: [name]  MRN#: [id number]  Unit: [facility]  Bed: [phone]    Time of Evaluation:  Time Calculation  PT Received On: 05/27/19  Start Time: 1341  Stop Time: 1358  Time Calculation (min): 17 min    Chart Review and Collaboration with Care Team: 8 minutes, not included in above time    PT Visit Number: 1    Consult received for [name] for PT Evaluation and Treatment for change in functional mobility.   Patients medical condition is appropriate for Physical therapy intervention at this time.    Activity Orders:  progressive mobility protocol and activity as tolerated    Precautions and Contraindications:  Precautions  Weight Bearing Status: no restrictions    Personal Protective Equipment (PPE)  gloves, procedure mask and eye shield/covering    Medical Diagnosis:  Generalized abdominal pain [R10.84]  End stage renal disease on dialysis [N18.6, Z99.2]  Partial small bowel obstruction [K56.600]  Peritoneal dialysis catheter in place [Z99.2]  Vomiting and diarrhea [R11.10, R19.7]    History of Present Illness:  [name] is a 64 y.o. female admitted on 05/27/2019 with ESRD on HD (Tu, Thr, Sa) and recent d/c from [facility] on 05/03 for abdominal pain, brought in for continued abdominal pain with nausea and vomiting. Imaging showed partial SBO.    Patient Active Problem List   Diagnosis    Iron deficiency anemia secondary to inadequate dietary iron intake    Sideropenic dysphagia    Peritoneal dialysis catheter dysfunction, initial encounter    Peritonitis    Upper GI bleed    ESRD (end stage renal disease) on dialysis    Generalized abdominal pain     Past Medical/Surgical History:  Past Medical History:   Diagnosis Date    Chronic obstructive pulmonary disease     Diabetes mellitus     Hyperlipidemia     Hypertension     Sleep apnea 2018     Past Surgical History:   Procedure Laterality Date    EGD,  BIOPSY N/A 05/14/2019    Procedure: EGD, BIOPSY;  Surgeon: Ronie Spies, MD;  [facility]: [facility];  Service: Gastroenterology;  Laterality: N/A;    JOINT REPLACEMENT      shoulder    JOINT REPLACEMENT      knee    OVARY SURGERY Left     Removal    TUNNELED CATH PLACEMENT (PERMCATH) Bilateral 05/23/2019    Procedure: TUNNELED CATH PLACEMENT;  Surgeon: Jacques Earthly, MD;  [facility]: [facility];  Service: Interventional Radiology;  Laterality: Bilateral;     X-Rays/Tests/Labs:  Lab Results   Component Value Date/Time    HGB 10.4 (L) 05/27/2019 05:07 AM    HCT 34.1 (L) 05/27/2019 05:07 AM    K 4.1 05/27/2019 05:07 AM    NA 139 05/27/2019 05:07 AM    INR 0.9 03/18/2006 03:00 PM    TROPI 0.04 05/17/2019 06:20 PM    TROPI 0.05 05/17/2019 01:37 PM    TROPI 0.01 03/18/2006 06:10 PM     All imaging reviewed, please see chart for details.    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 was d/c home from [facility] yesterday.     Subjective:  Patient is agreeable to participation in the therapy session. Nursing clears patient for therapy.     "I want this thing (referring to SBO) out of me"     Patient Goal: To get pain medication   Pain Assessment  Pain Assessment: Numeric Scale (0-10)  Pain Score: 10-severe pain  Pain Location: Abdomen  Pain Orientation: Lower  Pain Intervention(s): Medication (See eMAR);Ambulation/increased activity;Repositioned        Objective:  Observation of Patient/Vital Signs:  VSS t/o session. No complaints of dizziness. Pt. Crying out in pain t/o session.     Inspection/Posture: Reports N/T B feet @  baseline (hx neuropathy). Pt. crying out t/o session, eyes closed while in bed     Cognitive Status and Neuro Exam:  Cognition/Neuro Status  Arousal/Alertness: Appropriate responses to stimuli  Attention Span: Appears intact  Orientation Level: Oriented X4  Memory: Appears intact  Following Commands: Follows all commands and directions without difficulty  Safety Awareness: independent  Insights: Fully aware of deficits  Problem Solving: Able to problem solve independently  Comments: pt. crying out in pain t/o session   Behavior: attentive;calm;cooperative;crying  Motor Planning: intact  Coordination: intact    Musculoskeletal Examination  Gross ROM  Right Upper Extremity ROM: within functional limits  Left Upper Extremity ROM: within functional limits  Right Lower Extremity ROM: within functional limits  Left Lower Extremity ROM: within functional limits  Gross Strength  Right Upper Extremity Strength: within functional limits  Left Upper Extremity Strength: within functional limits  Right Lower Extremity Strength: 3+/5(limited by abdominal pain doesn't tolerate MMT )  Left Lower Extremity Strength: 3+/5(llimited by abdominal pain, doesn't tolerate MMT )       Functional Mobility:  Functional Mobility  Rolling: Contact Guard Assist;to Right;Logroll(logroll for comfort )  Supine to Sit: Minimal Assist;Increased Time;Increased Effort;using bedrail(@ trunk/BLE )  Scooting to HOB: Maximal Assist(attempts to assist, requires 2x and drawsheet 2/2 pain )  Scooting to EOB: Supervision  Sit to Supine: Minimal Assist(@ BLE)  Sit to Stand: Unable to assess (Comment)(pt. declines 2/2 pain)     Locomotion  Ambulation: Unable to assess (Comment)    Balance  Balance  Sitting - Static: Good  Sitting - Dynamic: Good  Standing - Static: Not tested  Standing - Dynamic: Not tested    Participation and Activity Tolerance  Participation and Endurance  Participation Effort: fair  Endurance: Tolerates < 10 min exercise, no significant  change in vital signs  [place] Dyspnea Scale: 2+ Dyspnea    Educated the patient to role of physical therapy, plan of care, goals of therapy and energy conservation techniques, discharge instructions, home safety with verbalized understanding.    Patient left in bed with alarm and all other medical equipment in place and call bell and all personal items/needs within reach.  RN notified of session outcome.    Assessment:  KIOSHA BUCHAN is a 64 y.o. female admitted 05/27/2019. Pt would benefit from Physical Therapy to address deficits and increase functional independence following multiple recent hospitalizations to ensure safe d/c.     PT Assessment  Assessment: Decreased LE strength;Decreased endurance/activity tolerance;Decreased functional mobility;Decreased sensation  Prognosis: Good;With continued PT status post acute discharge;Ongoing PT assessment needed  Progress: Progressing toward goals  Plan:  Treatment/Interventions: Exercise, Gait training, LE strengthening/ROM, Endurance training, Bed mobility, Continued evaluation  PT Frequency: 2-3x/wk  Risks/Benefits/POC Discussed with Pt/Family: With patient    PMP Activity: Step 4 - Dangle at Bedside  Distance Walked (ft) (Step 6,7): 0 Feet      Goals:  Goals  Goal Formulation: With patient  Time for Goal Acheivement: By time of discharge  Pt Will Go Supine To Sit: modified independent, to maximize functional mobility and independence  Pt Will Perform Sit To Supine: modified independent, to maximize functional mobility and independence  Pt Will Perform Sit to Stand: with supervision, to maximize functional mobility and independence       DME Recommended for Discharge: Front wheel walker(requires cont. assessment)  Discharge Recommendation: SNF(if pain controlled, may progess home w/ HHPT/OT)    Alinda Sierras, PT, DPT   Physical Therapist  Physical Medicine & Rehabilitation  Spectra [phone]  M-W &Fr 8-4:30pm  Thurs 12-8:30pm   05/27/2019 2:09 PM

## 2019-05-28 DIAGNOSIS — T85611A Breakdown (mechanical) of intraperitoneal dialysis catheter, initial encounter: Secondary | ICD-10-CM

## 2019-05-28 LAB — COMPREHENSIVE METABOLIC PANEL
ALT: <6 U/L (ref 0–55)
AST (SGOT): 7 U/L (ref 5–34)
Albumin/Globulin Ratio: 0.6 — ABNORMAL LOW (ref 0.9–2.2)
Albumin: 1.6 g/dL — ABNORMAL LOW (ref 3.5–5.0)
Alkaline Phosphatase: 95 U/L (ref 37–106)
Anion Gap: 12 (ref 5.0–15.0)
BUN: 24 mg/dL — ABNORMAL HIGH (ref 7.0–19.0)
Bilirubin, Total: 0.4 mg/dL (ref 0.2–1.2)
CO2: 20 mEq/L — ABNORMAL LOW (ref 22–29)
Calcium: 7.1 mg/dL — ABNORMAL LOW (ref 8.5–10.5)
Chloride: 105 mEq/L (ref 100–111)
Creatinine: 4.1 mg/dL — ABNORMAL HIGH (ref 0.6–1.0)
Globulin: 2.8 g/dL (ref 2.0–3.6)
Glucose: 194 mg/dL — ABNORMAL HIGH (ref 70–100)
Potassium: 4.7 mEq/L (ref 3.5–5.1)
Protein, Total: 4.4 g/dL — ABNORMAL LOW (ref 6.0–8.3)
Sodium: 137 mEq/L (ref 136–145)

## 2019-05-28 LAB — CBC
Absolute NRBC: 0 10*3/uL (ref 0.00–0.00)
Hematocrit: 33.1 % — ABNORMAL LOW (ref 34.7–43.7)
Hgb: 9.9 g/dL — ABNORMAL LOW (ref 11.4–14.8)
MCH: 29.4 pg (ref 25.1–33.5)
MCHC: 29.9 g/dL — ABNORMAL LOW (ref 31.5–35.8)
MCV: 98.2 fL — ABNORMAL HIGH (ref 78.0–96.0)
MPV: 11.8 fL (ref 8.9–12.5)
Nucleated RBC: 0 /100 WBC (ref 0.0–0.0)
Platelets: 149 10*3/uL (ref 142–346)
RBC: 3.37 10*6/uL — ABNORMAL LOW (ref 3.90–5.10)
RDW: 13 % (ref 11–15)
WBC: 12.77 10*3/uL — ABNORMAL HIGH (ref 3.10–9.50)

## 2019-05-28 LAB — GLUCOSE WHOLE BLOOD - POCT
Whole Blood Glucose POCT: 141 mg/dL — ABNORMAL HIGH (ref 70–100)
Whole Blood Glucose POCT: 144 mg/dL — ABNORMAL HIGH (ref 70–100)
Whole Blood Glucose POCT: 152 mg/dL — ABNORMAL HIGH (ref 70–100)
Whole Blood Glucose POCT: 193 mg/dL — ABNORMAL HIGH (ref 70–100)
Whole Blood Glucose POCT: 198 mg/dL — ABNORMAL HIGH (ref 70–100)

## 2019-05-28 LAB — GFR: EGFR: 13.2

## 2019-05-28 LAB — HEMOLYSIS INDEX: Hemolysis Index: 3 (ref 0–18)

## 2019-05-28 MED ORDER — PROMETHAZINE HCL 25 MG/ML IJ SOLN
6.2500 mg | Freq: Once | INTRAMUSCULAR | Status: DC | PRN
Start: 2019-05-28 — End: 2019-05-28

## 2019-05-28 MED ORDER — DIPHENHYDRAMINE HCL 50 MG/ML IJ SOLN
INTRAMUSCULAR | Status: AC
Start: 2019-05-28 — End: 2019-05-28
  Filled 2019-05-28: qty 1

## 2019-05-28 MED ORDER — GLYCOPYRROLATE 0.2 MG/ML IJ SOLN (WRAP)
INTRAMUSCULAR | Status: AC
Start: 2019-05-28 — End: ?
  Filled 2019-05-28: qty 1

## 2019-05-28 MED ORDER — NEOSTIGMINE METHYLSULFATE 1 MG/ML IJ/IV SOLN (WRAP)
Status: DC | PRN
Start: 2019-05-28 — End: 2019-05-28
  Administered 2019-05-28: 4 mg via INTRAVENOUS

## 2019-05-28 MED ORDER — ROCURONIUM BROMIDE 10 MG/ML IV SOLN (WRAP)
INTRAVENOUS | Status: DC | PRN
Start: 2019-05-27 — End: 2019-05-28
  Administered 2019-05-27: 20 mg via INTRAVENOUS

## 2019-05-28 MED ORDER — FENTANYL CITRATE (PF) 50 MCG/ML IJ SOLN (WRAP)
INTRAMUSCULAR | Status: AC
Start: 2019-05-28 — End: 2019-05-28
  Administered 2019-05-28: 01:00:00 50 ug via INTRAVENOUS
  Filled 2019-05-28: qty 2

## 2019-05-28 MED ORDER — FENTANYL CITRATE (PF) 50 MCG/ML IJ SOLN (WRAP)
50.0000 ug | INTRAMUSCULAR | Status: DC | PRN
Start: 2019-05-28 — End: 2019-05-28

## 2019-05-28 MED ORDER — ONDANSETRON HCL 4 MG/2ML IJ SOLN
4.0000 mg | Freq: Once | INTRAMUSCULAR | Status: DC | PRN
Start: 2019-05-28 — End: 2019-05-28

## 2019-05-28 MED ORDER — MORPHINE SULFATE 2 MG/ML IJ/IV SOLN (WRAP)
2.0000 mg | Status: DC | PRN
Start: 2019-05-28 — End: 2019-06-01
  Administered 2019-05-29 – 2019-06-01 (×6): 2 mg via INTRAVENOUS
  Filled 2019-05-28 (×6): qty 1

## 2019-05-28 MED ORDER — HEPARIN SODIUM (PORCINE) 5000 UNIT/ML IJ SOLN
5000.00 [IU] | Freq: Two times a day (BID) | INTRAMUSCULAR | Status: DC
Start: 2019-05-28 — End: 2019-06-01
  Administered 2019-05-28 – 2019-06-01 (×9): 5000 [IU] via SUBCUTANEOUS
  Filled 2019-05-28 (×10): qty 1

## 2019-05-28 MED ORDER — GLYCOPYRROLATE 0.2 MG/ML IJ SOLN (WRAP)
INTRAMUSCULAR | Status: DC | PRN
Start: 2019-05-28 — End: 2019-05-28
  Administered 2019-05-28: .5 mg via INTRAVENOUS

## 2019-05-28 MED ORDER — OXYCODONE HCL 5 MG PO TABS
5.0000 mg | ORAL_TABLET | Freq: Four times a day (QID) | ORAL | Status: DC | PRN
Start: 2019-05-28 — End: 2019-06-13
  Administered 2019-05-31 – 2019-06-12 (×7): 5 mg via ORAL
  Filled 2019-05-28 (×7): qty 1

## 2019-05-28 MED ORDER — DIPHENHYDRAMINE HCL 50 MG/ML IJ SOLN
6.2500 mg | Freq: Four times a day (QID) | INTRAMUSCULAR | Status: DC | PRN
Start: 2019-05-28 — End: 2019-05-28
  Administered 2019-05-28: 01:00:00 6.25 mg via INTRAVENOUS

## 2019-05-28 MED ORDER — FENTANYL CITRATE (PF) 50 MCG/ML IJ SOLN (WRAP)
INTRAMUSCULAR | Status: AC
Start: 2019-05-28 — End: ?
  Filled 2019-05-28: qty 2

## 2019-05-28 MED ORDER — LABETALOL HCL 5 MG/ML IV SOLN (WRAP)
10.0000 mg | Freq: Once | INTRAVENOUS | Status: DC
Start: 2019-05-28 — End: 2019-05-28

## 2019-05-28 MED ORDER — HYDROMORPHONE HCL 1 MG/ML IJ SOLN
0.2000 mg | INTRAMUSCULAR | Status: DC | PRN
Start: 2019-05-28 — End: 2019-05-28

## 2019-05-28 MED ORDER — ACETAMINOPHEN 325 MG PO TABS
650.0000 mg | ORAL_TABLET | Freq: Four times a day (QID) | ORAL | Status: DC | PRN
Start: 2019-05-28 — End: 2019-05-28

## 2019-05-28 MED ORDER — AMMONIA AROMATIC IN INHA
1.0000 | Freq: Once | RESPIRATORY_TRACT | Status: DC | PRN
Start: 2019-05-28 — End: 2019-05-28

## 2019-05-28 MED ORDER — MORPHINE SULFATE 2 MG/ML IJ/IV SOLN (WRAP)
2.0000 mg | Status: DC | PRN
Start: 2019-05-28 — End: 2019-05-28

## 2019-05-28 MED ORDER — HYDROCODONE-ACETAMINOPHEN 5-325 MG PO TABS
1.0000 | ORAL_TABLET | Freq: Once | ORAL | Status: DC | PRN
Start: 2019-05-28 — End: 2019-05-28

## 2019-05-28 MED ORDER — NEOSTIGMINE METHYLSULFATE 1 MG/ML IJ/IV SOLN (WRAP)
Status: AC
Start: 2019-05-28 — End: ?
  Filled 2019-05-28: qty 10

## 2019-05-28 MED ORDER — ONDANSETRON HCL 4 MG/2ML IJ SOLN
4.00 mg | Freq: Once | INTRAMUSCULAR | Status: DC | PRN
Start: 2019-05-28 — End: 2019-06-13

## 2019-05-28 MED ORDER — SODIUM CHLORIDE 0.9 % IV SOLN
INTRAVENOUS | Status: DC
Start: 2019-05-28 — End: 2019-06-13

## 2019-05-28 MED ORDER — OXYCODONE HCL 5 MG PO TABS
10.0000 mg | ORAL_TABLET | Freq: Four times a day (QID) | ORAL | Status: DC | PRN
Start: 2019-05-28 — End: 2019-06-13
  Administered 2019-05-28 – 2019-06-12 (×22): 10 mg via ORAL
  Filled 2019-05-28 (×22): qty 2

## 2019-05-28 MED ORDER — DOCUSATE SODIUM 100 MG PO CAPS
100.00 mg | ORAL_CAPSULE | Freq: Every day | ORAL | Status: DC
Start: 2019-05-28 — End: 2019-06-09
  Administered 2019-05-28 – 2019-06-04 (×6): 100 mg via ORAL
  Filled 2019-05-28 (×12): qty 1

## 2019-05-28 NOTE — Op Note (Signed)
Procedure Date: 05/28/2019     Patient Type: V     SURGEON: Osborne Casco MD  ASSISTANT:       FIRST ASSISTANT:   Gracy Bruins.     PREOPERATIVE DIAGNOSIS:  Peritonitis for peritoneal dialysis catheter.     POSTOPERATIVE DIAGNOSES:  1.  Infected peritoneal dialysis catheter.  2.  Adhesions with peritoneal dialysis catheter.     TITLE OF PROCEDURE:  Exploratory laparotomy, lysis of intestinal adhesion and removal of peritoneal dialysis catheter/foreign  object     ANESTHESIA:  General endotracheal.     ESTIMATED BLOOD LOSS:  Minimal.     INTRAVENOUS FLUID:  250 mL of crystalloid.     IMPLANTS:  None.     DRAINS:  None.     COMPLICATIONS:  None.     INDICATIONS FOR PROCEDURE:  Infected peritoneal dialysis catheter that was no longer being used with  the suspicion of peritonitis.     DESCRIPTION OF PROCEDURE:  I personally saw the patient in her room.  The resident was present with  me.  We examined her.  I went over the risks and benefits of removing her  peritoneal dialysis catheter, which was requested by the primary team.  She  asked appropriate questions and agreed to surgery.  She was later brought  down to the holding area.  She was consented for removal of this catheter.   She agreed to it and was taken to the OR where she initially underwent LMA  anesthesia and was prepped and draped in standard fashion.  Initially, we  made an incision at the area of the hemodialysis catheter and carefully  dissected down and traced it through the tunnel.  At this point, we were  able to release the cuff.  The dialysis catheter still would not give, it  would not come out of the abdomen and we end up having to do exploratory  laparotomy, go to the abdomen and found that the distal part of the tube  was completely adhesed around some of the bowel and omentum.  So, I had to  very carefully and meticulously lyse these adhesions between intestines and  intestines and then remove the tube.  We ended up cutting it in 2  different  spots, just so we can carefully take it out.  The entire foreign  object/peritoneal dialysis catheter was removed.  We did not see any frank  purulence in the abdomen, but we did send cultures of the fluid that was in  the abdomen as well as of the catheter.  We then closed the fascia with #1  Prolene in a figure-of-eight fashion interrupted and then closed the deeper  layers of the abdomen with a 0 Vicryl in interrupted fashion, and then  closed the skin loosely with staples.  This was a dirty wound from  infection, so we used the staples very loosely.  We then placed Telfa, 4 x  4 gauze and tape over the area.  The patient tolerated this very well, was  reversed from anesthesia, and brought to recovery room in good condition.     Of note, during the case, the patient was converted from LMA to  endotracheal anesthesia in order to get some relaxation in order to close  the abdomen.           D:  05/28/2019 00:42 AM by Dr. Osborne Casco, MD ([id number])  T:  05/28/2019 08:33 AM by NTS      (  Conf: [id number]) (Doc ID: [id number])

## 2019-05-28 NOTE — Progress Notes (Addendum)
Pt went to PACU for removal of peritoneal dialysis catheter, back on the floor with procedure done. Pt is drowsy but easily arouse. Vital signs are stable,on 4L SAT @93 %. C/O pain at the incisional site rate @ 10/10.,Iv morphine given.Surgical site is clean,dry and intact.Will continue to assess pt.

## 2019-05-28 NOTE — OT Eval Note (Signed)
[facility]  Occupational Therapy Evaluation and Treatment    Patient: [name]  MRN#: [id number]   Unit: [address]  Bed: A2308/A2308.B    Time of Evaluation and Treatment:  Time Calculation  OT Received On: 05/28/19  Start Time: 1050  Stop Time: 1136  Time Calculation (min): 46 min    Chart Review and Collaboration with Care Team: 15 minutes, not included in above time.    Evaluation: 16 minutes  Treatment: 30 minutes    OT Visit Number: 1    Consult received for [name] for OT Evaluation and Treatment.  Patients medical condition is appropriate for Occupational therapy intervention at this time.    Activity Orders:  OT eval and treat and progressive mobility protocol    Precautions and Contraindications:  Precautions  Weight Bearing Status: no restrictions  Aspiration Precautions: see MD/RN orders  Other Precautions: Falls    Personal Protective Equipment (PPE)  gloves, procedure mask and eye shield/covering    Medical Diagnosis:  Generalized abdominal pain [R10.84]  End stage renal disease on dialysis [N18.6, Z99.2]  Partial small bowel obstruction [K56.600]  Peritoneal dialysis catheter in place [Z99.2]  Vomiting and diarrhea [R11.10, R19.7]    History of Present Illness:  [name] is a 64 y.o. female admitted on 05/27/2019 with PMHx of  hypertension, hyperlipidemia, insulin-dependent diabetes mellitus, COPD, end-stage renal disease on peritoneal dialysis. Recently switched to hemodialysis and discharged yesterday return to the ED with recurrent abdominal pain associated with Non bloody diarrhea, nausea vomiting of nonbilious and nonbloody vomitus.  Patient states that she continued to have loose watery stools on arriving at home yesterday followed by nausea vomiting 2-3 times, described abdominal pain as left-sided pain intensity greater than 10, pain is worse with palpation. In the emergency room, CT abdomen pelvis showed partial small bowel  obstruction, trace bilateral pleural effusions. Abdominal pain likely due to viral gastroenteritis versus C. difficile infection. Pt also had infected peritoneal dialysis catheter and underwent removal on  05/28/19.    Patient Active Problem List   Diagnosis    Iron deficiency anemia secondary to inadequate dietary iron intake    Sideropenic dysphagia    Peritoneal dialysis catheter dysfunction, initial encounter    Peritonitis    Upper GI bleed    ESRD (end stage renal disease) on dialysis    Generalized abdominal pain        Past Medical/Surgical History:  Past Medical History:   Diagnosis Date    Chronic obstructive pulmonary disease     Diabetes mellitus     Hyperlipidemia     Hypertension     Sleep apnea 2018     Past Surgical History:   Procedure Laterality Date    EGD, BIOPSY N/A 05/14/2019    Procedure: EGD, BIOPSY;  Surgeon: Ronie Spies, MD;  Location: [facility];  Service: Gastroenterology;  Laterality: N/A;    EXPLORATORY LAPAROTOMY N/A 05/27/2019    Procedure: EXPLORATORY LAPAROTOMY;  Surgeon: Herbert Moors., MD;  [facility];  Service: General;  Laterality: N/A;    JOINT REPLACEMENT      shoulder    JOINT REPLACEMENT      knee    OVARY SURGERY Left     Removal    REMOVAL, FOREIGN BODY N/A 05/27/2019    Procedure: REMOVAL, PERITONEAL DIALYSIS CATHETER;  Surgeon: Herbert Moors., MD;  Location: [facility];  Service: General;  Laterality: N/A;  TUNNELED CATH PLACEMENT [facility] Bilateral 05/23/2019    Procedure: TUNNELED CATH PLACEMENT;  Surgeon: Jacques Earthly, MD;  Location[facility];  Service: Interventional Radiology;  Laterality: Bilateral;        X-Rays/Tests/Labs  Lab Results   Component Value Date/Time    HGB 9.9 (L) 05/28/2019 04:37 AM    HCT 33.1 (L) 05/28/2019 04:37 AM    K 4.7 05/28/2019 04:37 AM    NA 137 05/28/2019 04:37 AM    INR 0.9 03/18/2006 03:00 PM    TROPI 0.04 05/17/2019 06:20 PM    TROPI 0.05 05/17/2019 01:37 PM    TROPI  0.01 03/18/2006 06:10 PM       All imaging reviewed. 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 was d/c home from [facility] on 5/3.      Subjective:      Patient is agreeable to participation in the therapy session. Nursing clears patient for therapy.      Pain Assessment  Pain Assessment: Numeric Scale (0-10)  Pain Score: 10-severe pain  POSS Score: Awake and Alert  Pain Location: Abdomen(Pt in abdomen s/p removal of dialysis catheter)  Pain Orientation: Left, Mid  Pain Intervention(s): Medication (See eMAR), Other (Comment), Repositioned(Pt received morphine prior to OT evaluation)      Objective:   Inspection/Posture  Inspection/Posture: Reports N/T B feet @ baseline (hx neuropathy). Pt. crying out t/o session, eyes closed while in bed   Observation of Patient/Vital Signs: stable    Cognitive Status and Neuro Exam:  Cognition/Neuro Status  Orientation Level: Oriented X4  Memory: Appears intact  Following Commands: independent  Safety Awareness: minimal verbal instruction  Problem Solving: Able to problem solve independently  Behavior: attentive, calm, cooperative    Musculoskeletal Examination  Gross ROM  Right Upper Extremity ROM: within functional limits  Left Upper Extremity ROM: needs focused assessment  LUE ROM - Shoulder - % Reduced: reduced by 75%(Reduced  flex,ext, and abduction)  Gross Strength  Right Upper Extremity Strength: 3+/5  Left Upper Extremity Strength: 2+/5    Activities of Daily Living  Self-care and Home Management  Grooming: Supervision, in bed, wash/dry face, wash/dry hands  Bathing: Moderate Assist, in bed, right upper leg, left upper leg, right lower leg including foot, left lower leg including foot  UB Dressing: Minimal Assist(Min to thread LUE and tie gown in back)  LB Dressing: Dependent, in bed, Don/doff R sock, Don/doff L sock    Functional Mobility:  Mobility and Transfers  Scooting to HOB: Contact Guard Assist(Pt scooting while seated EOB)  Supine to Sit: Moderate Assist  Sit to Supine: Minimal Assist(Assist for LE)     Balance  Balance  Static Sitting Balance: Stand by Assist  Dyanamic Sitting Balance: Contact Guard Assist    Participation and Activity Tolerance  Participation and Endurance  Participation Effort:  good  Endurance: Tolerates < 10 min exercise, no significant change in vital signs    Educated the patient to role of occupational therapy, plan of care, goals of therapy and safety with mobility and ADLs, pursed lip breathing with verbalized understanding.    Patient left in bed with all medical equipment in place and call bell and all personal items/needs within reach (of note, pt received without alarm in place).  RN notified of session outcome.      Assessment:  SAMANTHAN DUGO is a 64 y.o. female admitted 05/27/2019. Patient would benefit from continued skilled OT to address deficits listed below and increase functional independence.     OT Assessment: decreased ROM;decreased strength;decreased independence with ADLs;decreased endurance/activity tolerance      Treatment:  Therapist provided training in bathing UB/LB while supine in bed. Pt required Sup for UB and mod a for LB. Pt began to become SOB and therapist instructed Pt in PLB technique. Pt's SpO2 monitored at 94%. Pt required TD to donn/doff socks. Therapist instructed  Pt in strategies for supine to sit while minimizing pain in abdomen. Pt required mod a for supine to sit. Pt was able to maintain fair static sitting balance while sitting EOB. CGA for dynamic sitting balance at EOB. Therapist instructed Pt in sit to supine with Pt requiring min a for LE.     Rehabilitation Potential: Prognosis: Good, With continued OT s/p acute discharge    Plan:  OT Frequency Recommended: 2-3x/wk   Treatment Interventions: ADL retraining, Functional transfer training, Endurance training, Patient/Family training, Equipment eval/education, UE strengthening/ROM     PMP Activity: Step 4 - Dangle at Bedside  Distance Walked (ft) (Step 6,7): 0 Feet    Risks/benefits/POC discussed    Goals:  Time For Goal Achievement: by time of discharge  ADL Goals  Patient will dress lower body: Moderate Assist, by time of discharge, Not met(TD to don socks)  Pt will complete bathing: Contact Guard Assist, by time of discharge, Partly met(Moderate assist for LB bathing)  Mobility and Transfer Goals  Pt will transfer bed to [facility]: Not met, by time of discharge, Minimal Assist  Neuro Re-Ed Goals  Pt will perform dynamic sitting balance: Modified Independent, by time of discharge, Partly met(CGA )                          DME Recommended for Discharge: [facility], Hospital bed, Medical alert system, [facility]  Discharge Recommendation: SNF      [place], [place]  [phone]    05/28/2019 12:04 PM

## 2019-05-28 NOTE — Progress Notes (Signed)
Call from [place] @ [place] inquiring on pt's d/c.  OP HD was arranged for pt at [facility] with start date planned for tomorrow - Thurs 5/6.  Copy of surgical OP report was faxed for their files.  Start date TBD based on d/c dispo.     Freida Busman, HD Care Coordinator  [phone]

## 2019-05-28 NOTE — Plan of Care (Signed)
Pt is A&Ox4. She was drowsy and took naps intermittently throughout the day. BS monitored. Morphine did not reduce her pain and then 2 tabs of Oxycodone brought it down. Heat and ice packs given for the abdomin also. The bandage was clean, dry and intact. Pt did not have much of an appetite.     In the afternoon, another nurse was suppose to take over care of this patient. The two nurses kept having difficulty handing off report do to the heaviness of floor, this went on for a while. The 2nd nurse got some information from the report and then was called away by the doctor. Timing never allowed for a full handoff and the original nurse wound up keeping her and giving report to night shift.

## 2019-05-28 NOTE — Plan of Care (Addendum)
Pt is alert and oriented x 4.Still complaining  of pain at surgical site rate 6/10 ,tylenol given. Will continue to assess pain .  Problem: Safety  Goal: Patient will be free from injury during hospitalization  Outcome: Progressing  Flowsheets (Taken 05/28/2019 0349)  Patient will be free from injury during hospitalization:   Assess patient's risk for falls and implement fall prevention plan of care per policy   Use appropriate transfer methods   Provide and maintain safe environment   Ensure appropriate safety devices are available at the bedside   Include patient/ family/ care giver in decisions related to safety   [place] rounding     Problem: Pain  Goal: Pain at adequate level as identified by patient  Flowsheets (Taken 05/28/2019 0349)  Pain at adequate level as identified by patient:   Identify patient comfort function goal   Assess for risk of opioid induced respiratory depression, including snoring/sleep apnea. Alert healthcare team of risk factors identified.   Assess pain on admission, during daily assessment and/or before any "as needed" intervention(s)   Reassess pain within 30-60 minutes of any procedure/intervention, per Pain Assessment, Intervention, Reassessment (AIR) Cycle

## 2019-05-28 NOTE — Progress Notes (Signed)
[facility]  PROGRESS NOTE      Patient: [name]  Date: 05/28/2019   LOS: 0 Days  Admission Date: 05/27/2019   MRN: [id number]  Attending: Ardeth Sportsman  Please contact me on Epic Chat      ASSESSMENT/PLAN     [name] is a 64 y.o. female admitted with Generalized abdominal pain    Interval Summary:     Patient Active Hospital Problem List:    Abdominal pain   -Likely due to PD peritonitis s/p PD removal   -Patient has been using IV antibiotic and was discharged on IV cefazolin. Continue IV cefazolin   -Lipase is normal, lactic acid normal  -Appreciate ID recs.   -Percocet for moderate pain  -Cultures pending, follow up     ESRD on peritoneal dialysis  -Started on dialysis on 05/2018  -Continue HD     Hypertension  -Continue home amlodipine, coreg    Hyperglycemia  Type 2 DM  -Continue home lantus, SSI, ac/hs accuchecks    Hyperlipidemia   -Continue home statin    Nutrition: clears   DVT Prophylaxis: Heparin      Code Status: Full  DISPO: TBD     SUBJECTIVE     [name] seen and examined at bedside   Denies cp, sob  Continues to report abdominal pain   Denies nausea/vomiting     MEDICATIONS     Current Facility-Administered Medications   Medication Dose Route Frequency    amLODIPine  5 mg Oral Q12H    atorvastatin  40 mg Oral Daily    carvedilol  25 mg Oral Q12H SCH    ceFAZolin  2 g Intravenous Daily after dinner    darbepoetin alfa  60 mcg Subcutaneous Weekly    docusate sodium  100 mg Oral Daily    insulin glargine  10 Units Subcutaneous QAM    insulin lispro  1-8 Units Subcutaneous TID AC    pantoprazole  40 mg Oral QAM AC    sevelamer  800 mg Oral TID MEALS    vancomycin  125 mg Oral Q6H       PHYSICAL EXAM     Vitals:    05/28/19 1205   BP: 110/67   Pulse: 73   Resp: 16   Temp: 98.1 F (36.7 C)   SpO2: 99%       Temperature: Temp  Min: 97.6 F (36.4 C)  Max: 99.1 F (37.3 C)  Pulse: Pulse  Min: 63  Max: 103  Respiratory: Resp  Min: 12  Max: 20  Non-Invasive  BP: BP  Min: 96/51  Max: 119/68  Pulse Oximetry SpO2  Min: 91 %  Max: 99 %    Intake and Output Summary (Last 24 hours) at Date Time    Intake/Output Summary (Last 24 hours) at 05/28/2019 1402  Last data filed at 05/28/2019 1200  Gross per 24 hour   Intake 495 ml   Output 250 ml   Net 245 ml       GEN APPEARANCE: Normal;  A&OX3  HEENT: PERLA; EOMI; Conjunctiva Clear  NECK: Supple; No bruits  CVS: RRR, S1, S2; No M/G/R  LUNGS: CTAB; No Wheezes; No Rhonchi: No rales  ABD: Soft; No TTP; + Normoactive BS  EXT: No edema; Pulses 2+ and intact  Skin exam:  pink  NEURO: non focal, moves all extremities   CAP REFILL:  Normal  MENTAL STATUS:  Normal    Exam done  by Ardeth Sportsman, MD on 05/28/19 at 2:02 PM      LABS     Recent Labs   Lab 05/28/19  0437 05/27/19  0507 05/26/19  0315   WBC 12.77* 15.22* 11.52*   RBC 3.37* 3.54* 3.20*   Hgb 9.9* 10.4* 9.5*   Hematocrit 33.1* 34.1* 31.1*   MCV 98.2* 96.3* 97.2*   Platelets 149 165 157       Recent Labs   Lab 05/28/19  0437 05/27/19  0507 05/26/19  0315 05/25/19  0418 05/24/19  0337   Sodium 137 139 141 142 141   Potassium 4.7 4.1 3.8 3.9 3.7   Chloride 105 103 105 105 101   CO2 20* 28 27 30* 31*   BUN 24.0* 13.0 23.0* 16.0 26.0*   Creatinine 4.1* 3.0* 4.0* 3.4* 5.0*   Glucose 194* 155* 127* 113* 253*   Calcium 7.1* 7.8* 7.5* 7.7* 8.0*       Recent Labs   Lab 05/28/19  0437 05/27/19  0507 05/26/19  0315   ALT <6 <6 <6   AST (SGOT) 7 9 13    Bilirubin, Total 0.4 1.0 0.5   Albumin 1.6* 2.1* 1.9*   Alkaline Phosphatase 95 108* 92                   Microbiology Results     Procedure Component Value Units Date/Time    Anaerobic culture [[id number]] Collected: 05/27/19 2343    Specimen: Other from Wound Updated: 05/28/19 0756    Anaerobic culture [[id number]] Collected: 05/27/19 2343    Specimen: Other from Fluid Updated: 05/28/19 0756    Blood Culture Aerobic/Anaerobic #1 [[id number]] Collected: 05/27/19 0507    Specimen: Arm from Blood, Venipuncture Updated: 05/28/19 1021    Narrative:       ORDER#: [id number]                                    ORDERED BY: CHUNG, SORA  SOURCE: Blood, Venipuncture arm                      COLLECTED:  05/27/19 05:07  ANTIBIOTICS AT COLL.:                                RECEIVED :  05/27/19 09:50  Culture Blood Aerobic and Anaerobic        PRELIM      05/28/19 10:21  05/28/19   No Growth after 1 day/s of incubation.      Blood Culture Aerobic/Anaerobic #2 [[id number]] Collected: 05/27/19 0507    Specimen: Arm from Blood, Venipuncture Updated: 05/28/19 1021    Narrative:      ORDER#: [id number]                                    ORDERED BY: CHUNG, SORA  SOURCE: Blood, Venipuncture arm                      COLLECTED:  05/27/19 05:07  ANTIBIOTICS AT COLL.:                                RECEIVED :  05/27/19 09:50  Culture Blood Aerobic and Anaerobic        PRELIM      05/28/19 10:21  05/28/19   No Growth after 1 day/s of incubation.      COVID-19 (SARS-COV-2) [facility]) [[id number]] Collected: 05/27/19 0624    Specimen: Nasopharyngeal Swab from Nasopharynx Updated: 05/27/19 0656     Purpose of COVID testing Screening     SARS-CoV-2 Specimen Source Nasopharyngeal     SARS CoV 2 Overall Result Negative     Comment: Test performed using the Abbott ID NOW EUA assay.  Please see Fact Sheets for patients and providers located at:  http://[facility].info/  This test is for the qualitative detection of SARS-CoV-2  (COVID19) nucleic acid. Viral nucleic acids may persist in vivo,  independent of viability. Detection of viral nucleic acid does  not imply the presence of infectious virus, or that virus  nucleic acid is the cause of clinical symptoms. Negative  results should be treated as presumptive and, if inconsistent  with clinical signs and symptoms or necessary for patient  management, should be tested with an alternative molecular  assay. Negative results do not preclude SARS-CoV-2 infection  and should not be used as the sole basis for patient  management decisions.  Invalid results may be due to inhibiting  substances in the specimen and recollection should occur.         Narrative:      o Collect and clearly label specimen type:  o Upper respiratory specimen: One Nasopharyngeal Dry Swab NO  Transport Media.  o Hand deliver to laboratory ASAP  Indication for testing->Extended care facility admission to  semi private room    Culture + Gram Lenise Arena, Body Fluid [[id number]] Collected: 05/27/19 0534    Specimen: Body Fluid from Paracentesis Fluid Updated: 05/28/19 0719    Narrative:      [id number] called Micro Results of Gram Stain. Results read back by: [id number], by  [id number] on 05/27/2019 at 14:31  < 72ml  ORDER#: [id number]                                    ORDERED BY: CHUNG, SORA  SOURCE: Paracentesis Fluid peritoneal dialysis cathetCOLLECTED:  05/27/19 05:34  ANTIBIOTICS AT COLL.:                                RECEIVED :  05/27/19 10:09  ORDER ENTRY COMMENTS:  < 66ml  [id number] called Micro Results of Gram Stain. Results read back by: [id number], by [id number] on 05/27/2019 at 14:31  Stain, Gram                                FINAL       05/27/19 14:31  05/27/19   No Squamous epithelial cells seen             No WBCs seen             Rare Gram positive cocci             Stain performed on Cytospin (concentrated) specimen  Culture and Gram Stain, Aerobic, Body FluidPRELIM      05/28/19 07:19  05/28/19   Culture no growth to date. Final report to follow      Fungus culture [[id number]] Collected: 05/27/19 2343  Specimen: Other from Tissue Updated: 05/28/19 0756    Fungus culture [[id number]] Collected: 05/27/19 0259    Specimen: Other from Fluid Updated: 05/28/19 0756    Wound culture & gram stain [[id number]] Collected: 05/27/19 2343    Specimen: Wound Updated: 05/28/19 1306    Narrative:      blue swab  ORDER#: [id number]                                    ORDERED BY: Baird Cancer, ENOCH  SOURCE: Wound peritoneal fluid                       COLLECTED:  05/27/19 23:43  ANTIBIOTICS AT COLL.:                                 RECEIVED :  05/28/19 07:56  ORDER ENTRY COMMENTS:  blue swab  Stain, Gram                                FINAL       05/28/19 13:06  05/28/19   Few WBCs             Rare Squamous epithelial cells             No organisms seen  Culture and Gram Stain, Aerobic, Wound     PENDING      Wound culture & gram stain [[id number]] Collected: 05/27/19 0221    Specimen: Wound Updated: 05/28/19 1303    Narrative:      ORDER#: [id number]                                    ORDERED BY: Kalman Jewels  SOURCE: Wound abdominal wound                        COLLECTED:  05/27/19 02:21  ANTIBIOTICS AT COLL.:                                RECEIVED :  05/28/19 07:56  Stain, Gram                                FINAL       05/28/19 13:03  05/28/19   Moderate WBCs             Few Squamous epithelial cells             No organisms seen  Culture and Gram Stain, Aerobic, Wound     PENDING             RADIOLOGY     CT Abd/Pelvis with IV Contrast only    Result Date: 05/27/2019  1. Fluid-filled mildly distended loops of small bowel with distal decompressed small bowel loops. Findings may represent early or partial small bowel obstruction. Enteritis is felt to be less likely. 2. Trace bilateral pleural effusions. 3. Additional chronic findings as above. Delaney Meigs, MD  05/27/2019 6:07 AM    Tunneled Cath Placement (Permcath)    Result Date: 05/23/2019   Technically  successful placement of an  23cm Equistream catheter via the right internal jugular vein as described above. Phlebitic changes in the right internal jugular vein from prior catheter placement. Dimitrios Papadouris, MD  05/23/2019 3:51 PM        Signed,  Larrie Kass D Kari Montero  2:02 PM 05/28/2019

## 2019-05-28 NOTE — Progress Notes (Signed)
POST OPERATIVE PROGRESS NOTE  [facility]    Date Time: 05/28/19 11:45 AM  Patient Name: [name],[name]      ASSESSMENT:   1 Day Post-Op S/P Procedure(s):  REMOVAL, PERITONEAL DIALYSIS CATHETER  EXPLORATORY LAPAROTOMY    toelrating clears  Pain is not controlled  PLAN:   Added oxycodone to the tylenol option  Colace for constipation  Pt/ot  Cultures pending-- gpc already, ID following  Renal failure per nephrology  Ice packs  Incentive spirometry          SUBJECTIVE:   The patient is doing fairly well.  Post operative pain is not well controlled with medications.  Current dietary status:  Glucerna Supplement Quantity: A. One; Flavor: Strawberry; Frequency: Daily with lunch  Diet clear liquid and tolerating well.  Flatus: yes. BM:  no.  Additional complaints: none       OBJECTIVE:   Current Vitals:   Vitals:    05/28/19 0736   BP: 115/66   Pulse: 71   Resp: 16   Temp: 98.1 F (36.7 C)   SpO2: 97%       Intake and Output Summary (Last 24 hours):  I/O last 3 completed shifts:  In: 33 [P.O.:100; I.V.:220; IV Piggyback:100]  Out: 250 [Urine:250]    Labs:     Results     Procedure Component Value Units Date/Time    Blood Culture Aerobic/Anaerobic #1 [[id number]] Collected: 05/27/19 0507    Specimen: Arm from Blood, Venipuncture Updated: 05/28/19 1021    Narrative:      ORDER#: [id number]                                    ORDERED BY: CHUNG, SORA  SOURCE: Blood, Venipuncture arm                      COLLECTED:  05/27/19 05:07  ANTIBIOTICS AT COLL.:                                RECEIVED :  05/27/19 09:50  Culture Blood Aerobic and Anaerobic        PRELIM      05/28/19 10:21  05/28/19   No Growth after 1 day/s of incubation.      Blood Culture Aerobic/Anaerobic #2 [[id number]] Collected: 05/27/19 0507    Specimen: Arm from Blood, Venipuncture Updated: 05/28/19 1021    Narrative:      ORDER#: [id number]                                    ORDERED BY: CHUNG, SORA  SOURCE: Blood, Venipuncture arm                       COLLECTED:  05/27/19 05:07  ANTIBIOTICS AT COLL.:                                RECEIVED :  05/27/19 09:50  Culture Blood Aerobic and Anaerobic        PRELIM      05/28/19 10:21  05/28/19   No Growth after 1 day/s of incubation.      Glucose Whole Blood - POCT [[id number]]  (  Abnormal) Collected: 05/28/19 0819     Updated: 05/28/19 0823     Whole Blood Glucose POCT 198 mg/dL     Fungus culture [[id number]] Collected: 05/27/19 2343    Specimen: Other from Tissue Updated: 05/28/19 0756    Anaerobic culture [[id number]] Collected: 05/27/19 2343    Specimen: Other from Wound Updated: 05/28/19 0756    Wound culture & gram stain [[id number]] Collected: 05/27/19 0221    Specimen: Wound Updated: 05/28/19 0756    Anaerobic culture [[id number]] Collected: 05/27/19 2343    Specimen: Other from Fluid Updated: 05/28/19 0756    Wound culture & gram stain [[id number]] Collected: 05/27/19 2343    Specimen: Wound Updated: 05/28/19 0756    Fungus culture [[id number]] Collected: 05/27/19 0259    Specimen: Other from Fluid Updated: 05/28/19 0756    Culture + Gram Stain,Aerobic, Body Fluid [[id number]] Collected: 05/27/19 0534    Specimen: Body Fluid from Paracentesis Fluid Updated: 05/28/19 0719    Narrative:      [id number] called Micro Results of Gram Stain. Results read back by: [id number], by  [id number] on 05/27/2019 at 14:31  < 64ml  ORDER#: [id number]                                    ORDERED BY: CHUNG, SORA  SOURCE: Paracentesis Fluid peritoneal dialysis cathetCOLLECTED:  05/27/19 05:34  ANTIBIOTICS AT COLL.:                                RECEIVED :  05/27/19 10:09  ORDER ENTRY COMMENTS:  < 10ml  [id number] called Micro Results of Gram Stain. Results read back by: [id number], by [id number] on 05/27/2019 at 14:31  Stain, Gram                                FINAL       05/27/19 14:31  05/27/19   No Squamous epithelial cells seen             No WBCs seen             Rare Gram positive cocci             Stain performed on Cytospin (concentrated) specimen   Culture and Gram Stain, Aerobic, Body FluidPRELIM      05/28/19 07:19  05/28/19   Culture no growth to date. Final report to follow      GFR [[id number]] Collected: 05/28/19 0437     Updated: 05/28/19 0545     EGFR 13.2    Comprehensive metabolic panel [[id number]]  (Abnormal) Collected: 05/28/19 0437    Specimen: Blood Updated: 05/28/19 0545     Glucose 194 mg/dL      BUN 24.0 mg/dL      Creatinine 4.1 mg/dL      Sodium 137 mEq/Young      Potassium 4.7 mEq/Young      Chloride 105 mEq/Young      CO2 20 mEq/Young      Calcium 7.1 mg/dL      Protein, Total 4.4 g/dL      Albumin 1.6 g/dL      AST (SGOT) 7 U/Young      ALT <6 U/Young      Alkaline Phosphatase 95 U/Young      Bilirubin, Total 0.4 mg/dL  Globulin 2.8 g/dL      Albumin/Globulin Ratio 0.6     Anion Gap 12.0    Hemolysis index [[id number]] Collected: 05/28/19 0437     Updated: 05/28/19 0545     Hemolysis Index 3    CBC without differential [[id number]]  (Abnormal) Collected: 05/28/19 0437    Specimen: Blood Updated: 05/28/19 0501     WBC 12.77 x10 3/uL      Hgb 9.9 g/dL      Hematocrit 33.1 %      Platelets 149 x10 3/uL      RBC 3.37 x10 6/uL      MCV 98.2 fL      MCH 29.4 pg      MCHC 29.9 g/dL      RDW 13 %      MPV 11.8 fL      Nucleated RBC 0.0 /100 WBC      Absolute NRBC 0.00 x10 3/uL     Glucose Whole Blood - POCT [[id number]]  (Abnormal) Collected: 05/28/19 0047     Updated: 05/28/19 0048     Whole Blood Glucose POCT 144 mg/dL     Glucose Whole Blood - POCT [[id number]]  (Abnormal) Collected: 05/27/19 2242     Updated: 05/27/19 2255     Whole Blood Glucose POCT 135 mg/dL     Glucose Whole Blood - POCT [[id number]]  (Abnormal) Collected: 05/27/19 2106     Updated: 05/27/19 2114     Whole Blood Glucose POCT 143 mg/dL     Glucose Whole Blood - POCT [[id number]]  (Abnormal) Collected: 05/27/19 1705     Updated: 05/27/19 1709     Whole Blood Glucose POCT 142 mg/dL           Rads:     Radiology Results (24 Hour)     ** No results found for the last 24 hours. **          Physical Exam:      Mental status - alert, oriented to person, place, and time, normal mood, behavior, speech, dress, motor activity, and thought processes  Chest - clear to auscultation, no wheezes, rales or rhonchi, symmetric air entry, no tachypnea, retractions or cyanosis  Heart - normal rate, regular rhythm, normal S1, S2, no murmurs, rubs, clicks or gallops  Abdomen - tenderness noted at incision as expected    Wound - clean, dry, no drainage  Extremities - peripheral pulses normal, no pedal edema, no clubbing or cyanosis      Signed by: Iran Sizer

## 2019-05-28 NOTE — Anesthesia Postprocedure Evaluation (Signed)
Anesthesia Post Evaluation    Patient: [name]    Procedure(s):  REMOVAL, PERITONEAL DIALYSIS CATHETER  EXPLORATORY LAPAROTOMY    Anesthesia type: general    Last Vitals:   Vitals Value Taken Time   BP 103/56 05/28/19 0100   Temp 36.6 C (97.9 F) 05/28/19 0045   Pulse 68 05/28/19 0100   Resp 20 05/28/19 0100   SpO2 91 % 05/28/19 0100                 Anesthesia Post Evaluation:     Patient Evaluated: PACU  Patient Participation: complete - patient participated  Level of Consciousness: awake and alert  Pain Score: 3  Pain Management: adequate  Multimodal analgesia pain management approach    Airway Patency: patent    Anesthetic complications: No      PONV Status: none    Cardiovascular status: acceptable and hemodynamically stable  Respiratory status: acceptable and nasal cannula  Hydration status: acceptable  Comments: No apparent anesthetic complications or reportable events        Signed by: Roselee Nova, 05/28/2019 1:18 AM

## 2019-05-28 NOTE — Nursing Progress Note (Addendum)
Medicine Nursing Note    Date Time: 05/28/19 10:59 PM  Patient Name: [name]  Attending Physician: Ardeth Sportsman, MD    Assessment     Shift event: Pain management, IV and PO antibiotic given per order, Wean off the oxygen from 4 L to 3 L, pt c/o itching BUE- lotion applied with good effect.   The a.m lab not done due to hard stick    Neuro: AOx4, calm and cooperative    Tele/CV/CP 24 Tele, VSS, NC 3 L, Afebrile   Foley/GU NA Pt voiding - external catheter   BMs/GI Last BM 5/4- previous shift report , on clear liquid diet   Skin: Bruise, surgical site- peritonitis tube removed 5/5, scars- right chest    Isolation: NA   Pain: C/opain/discomfort- PRN pain medication given with good effect    Mews: 1   Central Line: Dialysis perm cath on right chest- CHG done    Safety:  Placed ___high____ fall risk. Safety maintained, purposeful rounding performed, Call bell within reach.    Interpreter: Pt speaks English and denies interpreter needs.   Comments:    Pt c/o pain and PRN medication given.   Medication given per order.   IV fluid infusing.     Provided comfort including reposition, pain medication and ICE pack for abdomen pain and discomfort  Oral fluid offered. Hygiene supply provided.   Bilateral heel protector applied.  SCD on.       No c/o cough and SOB.  Declined nausea and vomiting.   No acute distress noted with breathing.      The whiteboard updated with current team information.   The belongings at bedside.   Discussed and explained the care plan for the night.   Explained and educated pt on safety precaution.    The shift report given to incoming RN at bedside.

## 2019-05-28 NOTE — Transfer of Care (Signed)
Anesthesia Transfer of Care Note    Patient: [name]    Procedures performed: Procedure(s):  REMOVAL, PERITONEAL DIALYSIS CATHETER  EXPLORATORY LAPAROTOMY    Anesthesia type: General ETT    Patient location:Phase I PACU    Last vitals:   Vitals:    05/28/19 0045   BP: 105/55   Pulse: 67   Resp: 18   Temp: 36.6 C (97.9 F)   SpO2: 92%       Post pain: Patient not complaining of pain, continue current therapy      Mental Status:awake    Respiratory Function: tolerating face mask    Cardiovascular: stable    Nausea/Vomiting: patient not complaining of nausea or vomiting    Hydration Status: adequate    Post assessment: no apparent anesthetic complications, no reportable events and no evidence of recall    Signed by: Lurene Shadow  05/28/19 12:45 AM

## 2019-05-28 NOTE — Progress Notes (Signed)
INFECTIOUS DISEASES PROGRESS NOTE    Date Time: 05/28/19 3:24 PM  Patient Name: [name], [name]  Patient status.Observation  Hospital Day: 0    Assessment and Plan:   1.  PD catheter peritonitis/E. Coli  2.  Old PD catheter removed.  Op note read.  Gram-positive cocci on Gram stain      Plan: 1.  Cefazolin for E. coli peritonitis from the first admission.  Hopefully will cover gram-positive cocci found on this admission.  2.  Continue p.o. vancomycin    Subjective:   Nontoxic    Review of Systems:   Review of Systems -severe abdominal pain which now represents postop pain  No cough or shortness of breath  No angina or palpitations  No nausea or vomiting  No headache syncope or seizures    Antibiotics:   As above    Other medications reviewed in EPIC.  Central Access:   New dialysis access day 3    Physical Exam:     Vitals:    05/28/19 1205   BP: 110/67   Pulse: 73   Resp: 16   Temp: 98.1 F (36.7 C)   SpO2: 99%       Chest - clear to auscultation, no wheezes, rales or rhonchi, symmetric air entry  Heart - normal rate, regular rhythm, normal S1, S2, no murmurs, rubs, clicks or gallops  Abdomen - No bowel sounds heard.  She remains tender in the right lower quadrant    Labs:     Microbiology Results     Procedure Component Value Units Date/Time    Blood Culture Aerobic/Anaerobic #1 [[id number]] Collected: 05/27/19 0507    Specimen: Arm from Blood, Venipuncture Updated: 05/27/19 0950    Narrative:      1 BLUE+1 PURPLE    Blood Culture Aerobic/Anaerobic #2 [[id number]] Collected: 05/27/19 0507    Specimen: Arm from Blood, Venipuncture Updated: 05/27/19 0950    Narrative:      1 BLUE+1 PURPLE    COVID-19 (SARS-COV-2) [facility]) [[id number]] Collected: 05/27/19 0624    Specimen: Nasopharyngeal Swab from Nasopharynx Updated: 05/27/19 0656     Purpose of COVID testing Screening     SARS-CoV-2 Specimen Source Nasopharyngeal     SARS CoV 2 Overall Result Negative     Comment: Test performed using the Abbott ID NOW EUA  assay.  Please see Fact Sheets for patients and providers located at:  http://[facility].info/  This test is for the qualitative detection of SARS-CoV-2  (COVID19) nucleic acid. Viral nucleic acids may persist in vivo,  independent of viability. Detection of viral nucleic acid does  not imply the presence of infectious virus, or that virus  nucleic acid is the cause of clinical symptoms. Negative  results should be treated as presumptive and, if inconsistent  with clinical signs and symptoms or necessary for patient  management, should be tested with an alternative molecular  assay. Negative results do not preclude SARS-CoV-2 infection  and should not be used as the sole basis for patient  management decisions. Invalid results may be due to inhibiting  substances in the specimen and recollection should occur.         Narrative:      o Collect and clearly label specimen type:  o Upper respiratory specimen: One Nasopharyngeal Dry Swab NO  Transport Media.  o Hand deliver to laboratory ASAP  Indication for testing->Extended care facility admission to  semi private room    Culture + Gram Stain,Aerobic, Body Fluid [[id number]]  Collected: 05/27/19 0534    Specimen: Body Fluid from Paracentesis Fluid Updated: 05/27/19 1009          CBC w/Diff CMP   Recent Labs   Lab 05/28/19  0437 05/27/19  0507 05/26/19  0315   WBC 12.77* 15.22* 11.52*   Hgb 9.9* 10.4* 9.5*   Hematocrit 33.1* 34.1* 31.1*   Platelets 149 165 157   MCV 98.2* 96.3* 97.2*   Neutrophils  --  86.8  --        PT/INR         Recent Labs   Lab 05/28/19  0437 05/27/19  0507 05/26/19  0315   Sodium 137 139 141   Potassium 4.7 4.1 3.8   Chloride 105 103 105   CO2 20* 28 27   BUN 24.0* 13.0 23.0*   Creatinine 4.1* 3.0* 4.0*   Glucose 194* 155* 127*   Calcium 7.1* 7.8* 7.5*   Protein, Total 4.4* 5.4* 4.7*   Albumin 1.6* 2.1* 1.9*   AST (SGOT) 7 9 13    ALT <6 <6 <6   Alkaline Phosphatase 95 108* 92   Bilirubin, Total 0.4 1.0 0.5      Glucose POCT   Recent  Labs   Lab 05/28/19  0437 05/27/19  0507 05/26/19  0315 05/25/19  0418 05/24/19  0337 05/23/19  0451 05/22/19  0440   Glucose 194* 155* 127* 113* 253* 316* 342*          Rads:     Radiology Results (24 Hour)     ** No results found for the last 24 hours. **            Signed by: Ernst Breach

## 2019-05-28 NOTE — Plan of Care (Signed)
Problem: Moderate/High Fall Risk Score >5  Goal: Patient will remain free of falls  Outcome: Progressing  Flowsheets (Taken 05/28/2019 2225)  High (Greater than 13):   HIGH-Visual cue at entrance to patient's room   HIGH-Bed alarm on at all times while patient in bed   HIGH-Utilize chair pad alarm for patient while in the chair   HIGH-Apply yellow "Fall Risk" arm band   HIGH-Initiate use of floor mats as appropriate     Problem: Safety  Goal: Patient will be free from injury during hospitalization  Outcome: Progressing  Flowsheets (Taken 05/28/2019 2302)  Patient will be free from injury during hospitalization:   Assess patient's risk for falls and implement fall prevention plan of care per policy   Provide and maintain safe environment   Ensure appropriate safety devices are available at the bedside   Hourly rounding   Include patient/ family/ care giver in decisions related to safety   Use appropriate transfer methods   Assess for patients risk for elopement and implement Elopement Risk Plan per policy  Goal: Patient will be free from infection during hospitalization  Outcome: Progressing  Flowsheets (Taken 05/28/2019 2302)  Free from Infection during hospitalization:   Assess and monitor for signs and symptoms of infection   Monitor lab/diagnostic results   Monitor all insertion sites (i.e. indwelling lines, tubes, urinary catheters, and drains)   Encourage patient and family to use good hand hygiene technique     Problem: Pain  Goal: Pain at adequate level as identified by patient  Outcome: Progressing  Flowsheets (Taken 05/28/2019 2302)  Pain at adequate level as identified by patient:   Identify patient comfort function goal   Assess for risk of opioid induced respiratory depression, including snoring/sleep apnea. Alert healthcare team of risk factors identified.   Assess pain on admission, during daily assessment and/or before any "as needed" intervention(s)   Reassess pain within 30-60 minutes of any  procedure/intervention, per Pain Assessment, Intervention, Reassessment (AIR) Cycle   Evaluate if patient comfort function goal is met   Evaluate patient's satisfaction with pain management progress   Consult/collaborate with Pain Service   Offer non-pharmacological pain management interventions     Problem: Side Effects from Pain Analgesia  Goal: Patient will experience minimal side effects of analgesic therapy  Outcome: Progressing  Flowsheets (Taken 05/28/2019 2302)  Patient will experience minimal side effects of analgesic therapy:   Monitor/assess patient's respiratory status (RR depth, effort, breath sounds)   Assess for changes in cognitive function   Prevent/manage side effects per LIP orders (i.e. nausea, vomiting, pruritus, constipation, urinary retention, etc.)   Evaluate for opioid-induced sedation with appropriate assessment tool (i.e. POSS)     Problem: Altered GI Function  Goal: Fluid and electrolyte balance are achieved/maintained  Outcome: Progressing  Flowsheets (Taken 05/28/2019 2302)  Fluid and electrolyte balance are achieved/maintained:   Monitor intake and output every shift   Monitor/assess lab values and report abnormal values   Provide adequate hydration   Monitor daily weight

## 2019-05-28 NOTE — Brief Op Note (Signed)
BRIEF OP NOTE    Date Time: 05/28/19 12:35 AM    Patient Name:   [name]    Date of Operation:   05/27/2019    Providers Performing:   Surgeon(s):  Baird Cancer, Baron Hamper., MD    Assistant (s):   Circulator: Lyndee Hensen, RN  Scrub Person: Pershing Cox, [place]  First Assistant: Gracy Bruins  Team Leader: Ivan Anchors, RN    Operative Procedure:   Procedure(s):  REMOVAL, PERITONEAL DIALYSIS CATHETER  EXPLORATORY LAPAROTOMY    Preoperative Diagnosis:   Pre-Op Diagnosis Codes:     * Peritonitis [K65.9]    Postoperative Diagnosis:   * peritonitis; adhesions *    Anesthesia:   General    Estimated Blood Loss:    Minimal    IV Fluid Replacement:     250 ml crystalloid    Urine Output:     No foley    Implants:   * No implants in log *    Drains:   Drains: no    Specimens:   PD catheter    Findings:   Adhesions in abdomen around PD catheter    Complications:   none      Signed by: Herbert Moors., MD                                                                           [facility]

## 2019-05-28 NOTE — UM Notes (Signed)
05/28/2019  MEDICAID HMO/[facility] [facility] [place]      05/27/19 2992  Admit to Observation Once               Procedure: REMOVAL, PERITONEAL DIALYSIS CATHETER Case Time: 05/27/2019 11:23 PM Surgeon: Herbert Moors., MD           Signed             [] Hide copied text    [] Hover for details  BRIEF OP NOTE    Date Time: 05/28/19 12:35 AM    Patient Name:   [name]    Date of Operation:   05/27/2019    Providers Performing:   Surgeon(s):  Baird Cancer, Baron Hamper., MD    Assistant (s):   Circulator: Lyndee Hensen, RN  Scrub Person: Pershing Cox, [place]  First Assistant: Gracy Bruins  Team Leader: Ivan Anchors, RN    Operative Procedure:   Procedure(s):  REMOVAL, PERITONEAL DIALYSIS CATHETER  EXPLORATORY LAPAROTOMY              05/28/2019  O2 sat 97% on 4L o2, T 99.8, HR 82, RR 16, BP 141/72, WBC 15.22, H/H 10.4/34.1, Glucose 155, Creatanine 3.0, Ca 7.8, Protein total 5.4, Albumin 2.1, Alk Phos 108, Albumin/Globulin ratio 0.6, blood culture pending, EGFR 19.0,     Stain, Gram                FINAL    05/27/19 14:31   05/27/19  No Squamous epithelial cells seen        No WBCs seen        Rare Gram positive cocci        Stain performed on Cytospin (concentrated) specimen   Culture and Gram Stain, Aerobic, Body FluidPRELIM   05/28/19 07:19   05/28/19  Culture no growth to date. Final report to follow     Medications  Report  Scheduled    amLODIPine (NORVASC) tablet 5 mg   5 mg, PO, Q12H   Last Action:  Given, 5 mg at 05/04 1058   atorvastatin (LIPITOR) tablet 40 mg   40 mg, PO, Daily   Last Action:  Given, 40 mg at 05/05 0834   carvedilol (COREG) tablet 25 mg   25 mg, PO, Q12H [facility]   Last Action:  Given, 25 mg at 05/05 0834   ceFAZolin (ANCEF) 2 g in dextrose 5 % 50 mL IVPB   2 g, IV, Daily after dinner   Last Action:  New Bag, 2 g at 05/04 1724   darbepoetin alfa (ARANESP) injection 60 mcg   60 mcg, SC, Weekly   Last Action:  Given, 60 mcg at 05/04 1724    diphenhydrAMINE (BENADRYL) 50 MG/ML injection   No Dose/Rate   Last Action:  Ordered   insulin glargine (LANTUS) injection 10 Units   10 Units, SC, QAM   Last Action:  Given, 10 Units at 05/05 0834   insulin lispro (HumaLOG) injection 1-8 Units (And Linked Group #1)   1-8 Units, SC, TID AC   Last Action:  Given, 1 Units at 05/05 0834   pantoprazole (PROTONIX) EC tablet 40 mg   40 mg, PO, QAM AC   Last Action:  Given, 40 mg at 05/05 0834   sevelamer (RENVELA) tablet 800 mg   800 mg, PO, TID MEALS   Last Action:  Given, 800 mg at 05/05 0834   vancomycin oral 50 mg/mL solution 125 mg   125 mg,  PO, Q6H   Last Action:  Given, 125 mg at 05/05 0834   Continuous    0.9% NaCl infusion   15 mL/hr, IV, Continuous   Last Action:  Ordered   0.9% NaCl infusion   20 mL/hr, IV, Continuous   Last Action:  New Bag, 20 mL/hr at 05/04 1100   lactated ringers infusion   20 mL/hr, IV, Continuous   Last Action:  Ordered   PRN    0.9% NaCl infusion   20 mL/hr, IV, PRN   Last Action:  Ordered   acetaminophen (TYLENOL) tablet 650 mg   650 mg, PO, Q6H PRN   Last Action:  Given, 650 mg at 05/05 0425   bisacodyl (DULCOLAX) suppository 10 mg   10 mg, RE, Daily PRN   Last Action:  Ordered   dextrose (GLUCOSE) 40 % oral gel 15 g of glucose (And Linked Group #2)   15 g of glucose, PO, PRN   Last Action:  Ordered   dextrose 50 % bolus 12.5 g (And Linked Group #2)   12.5 g, IV, PRN   Last Action:  Ordered   glucagon (rDNA) (GLUCAGEN) injection 1 mg (And Linked Group #2)   1 mg, IM, PRN   Last Action:  Ordered   morphine injection 3 mg   3 mg, IV, Q4H PRN   Last Action:  Given, 3 mg at 05/05 0834   naloxone (NARCAN) injection 0.2 mg   0.2 mg, IV, PRN   Last Action:  Ordered   ondansetron (ZOFRAN) injection 4 mg   4 mg, IV, Once PRN   Last Action:  Ordered   ondansetron (ZOFRAN) injection 4 mg (Or Linked Group #3)   4 mg, IV, Q6H PRN   Last Action:  Ordered   ondansetron (ZOFRAN-ODT) disintegrating tablet 4 mg (Or Linked Group #3)   4 mg, PO, Q6H PRN    Last Action:  Ordered

## 2019-05-28 NOTE — Plan of Care (Signed)
Discharge Recommendation: SNF   DME Recommended for Discharge: [facility], Hospital bed, Medical alert system, [facility]    OT Frequency Recommended: 2-3x/wk     Is PT evaluation indicated at this time? This patient is already on PT caseload.     PMP Activity: Step 4 - Dangle at Bedside  Distance Walked (ft) (Step 6,7): 0 Feet  (Please See Therapy Evaluation for device and assistance level needed)    Treatment Interventions: ADL retraining, Functional transfer training, Endurance training, Patient/Family training, Equipment eval/education, UE strengthening/ROM       Goal Formulation: Patient  Time For Goal Achievement: by time of discharge  ADL Goals  Patient will dress lower body: Moderate Assist, by time of discharge, Not met(TD to don socks)  Pt will complete bathing: Contact Guard Assist, by time of discharge, Partly met(Moderate assist for LB bathing)  Mobility and Transfer Goals  Pt will transfer bed to [facility]: Not met, by time of discharge, Minimal Assist  Neuro Re-Ed Goals  Pt will perform dynamic sitting balance: Modified Independent, by time of discharge, Partly met(CGA )

## 2019-05-29 LAB — COMPREHENSIVE METABOLIC PANEL
ALT: <6 U/L (ref 0–55)
AST (SGOT): 10 U/L (ref 5–34)
Albumin/Globulin Ratio: 0.5 — ABNORMAL LOW (ref 0.9–2.2)
Albumin: 1.4 g/dL — ABNORMAL LOW (ref 3.5–5.0)
Alkaline Phosphatase: 89 U/L (ref 37–106)
Anion Gap: 8 (ref 5.0–15.0)
BUN: 29 mg/dL — ABNORMAL HIGH (ref 7.0–19.0)
Bilirubin, Total: 0.2 mg/dL (ref 0.2–1.2)
CO2: 26 mEq/L (ref 22–29)
Calcium: 6.9 mg/dL — ABNORMAL LOW (ref 8.5–10.5)
Chloride: 102 mEq/L (ref 100–111)
Creatinine: 5 mg/dL — ABNORMAL HIGH (ref 0.6–1.0)
Globulin: 2.8 g/dL (ref 2.0–3.6)
Glucose: 146 mg/dL — ABNORMAL HIGH (ref 70–100)
Potassium: 4 mEq/L (ref 3.5–5.1)
Protein, Total: 4.2 g/dL — ABNORMAL LOW (ref 6.0–8.3)
Sodium: 136 mEq/L (ref 136–145)

## 2019-05-29 LAB — CBC
Absolute NRBC: 0 10*3/uL (ref 0.00–0.00)
Hematocrit: 26.1 % — ABNORMAL LOW (ref 34.7–43.7)
Hgb: 7.6 g/dL — ABNORMAL LOW (ref 11.4–14.8)
MCH: 28.5 pg (ref 25.1–33.5)
MCHC: 29.1 g/dL — ABNORMAL LOW (ref 31.5–35.8)
MCV: 97.8 fL — ABNORMAL HIGH (ref 78.0–96.0)
MPV: 12.1 fL (ref 8.9–12.5)
Nucleated RBC: 0 /100 WBC (ref 0.0–0.0)
Platelets: 116 10*3/uL — ABNORMAL LOW (ref 142–346)
RBC: 2.67 10*6/uL — ABNORMAL LOW (ref 3.90–5.10)
RDW: 13 % (ref 11–15)
WBC: 2.12 10*3/uL — ABNORMAL LOW (ref 3.10–9.50)

## 2019-05-29 LAB — GLUCOSE WHOLE BLOOD - POCT
Whole Blood Glucose POCT: 137 mg/dL — ABNORMAL HIGH (ref 70–100)
Whole Blood Glucose POCT: 160 mg/dL — ABNORMAL HIGH (ref 70–100)
Whole Blood Glucose POCT: 168 mg/dL — ABNORMAL HIGH (ref 70–100)

## 2019-05-29 LAB — GFR: EGFR: 10.5

## 2019-05-29 LAB — HEMOLYSIS INDEX: Hemolysis Index: 10 (ref 0–18)

## 2019-05-29 MED ORDER — AQUAPHOR EX OINT
TOPICAL_OINTMENT | CUTANEOUS | Status: DC | PRN
Start: 2019-05-29 — End: 2019-06-13
  Filled 2019-05-29 (×3): qty 50

## 2019-05-29 MED ORDER — SODIUM CHLORIDE 0.9 % IV BOLUS
100.0000 mL | INTRAVENOUS | Status: AC | PRN
Start: 2019-05-29 — End: 2019-05-29

## 2019-05-29 MED ORDER — RISAQUAD PO CAPS
1.00 | ORAL_CAPSULE | Freq: Every day | ORAL | Status: DC
Start: 2019-05-29 — End: 2019-06-13
  Administered 2019-05-29 – 2019-06-13 (×16): 1 via ORAL
  Filled 2019-05-29 (×16): qty 1

## 2019-05-29 MED ORDER — PIPERACILLIN-TAZOBACTAM 2.25 GM MBP (CNR)
2.25 g | Freq: Two times a day (BID) | INTRAVENOUS | Status: DC
Start: 2019-05-29 — End: 2019-05-30
  Administered 2019-05-29 – 2019-05-30 (×2): 2250 mg via INTRAVENOUS
  Filled 2019-05-29 (×3): qty 2250

## 2019-05-29 MED ORDER — SODIUM CHLORIDE 0.9 % IV BOLUS
250.0000 mL | INTRAVENOUS | Status: AC | PRN
Start: 2019-05-29 — End: 2019-05-29

## 2019-05-29 MED ORDER — DIPHENHYDRAMINE HCL 25 MG PO CAPS
25.00 mg | ORAL_CAPSULE | Freq: Four times a day (QID) | ORAL | Status: DC | PRN
Start: 2019-05-29 — End: 2019-06-13
  Administered 2019-05-29 – 2019-05-30 (×3): 25 mg via ORAL
  Filled 2019-05-29 (×3): qty 1

## 2019-05-29 NOTE — Plan of Care (Signed)
Problem: Renal Instability  Goal: Fluid and electrolyte balance are achieved/maintained  Flowsheets (Taken 05/29/2019 1323)  Fluid and electrolyte balance are achieved/maintained:   Monitor intake and output every shift   Monitor/assess lab values and report abnormal values   Assess for confusion/personality changes   Monitor daily weight   Assess and reassess fluid and electrolyte status   Observe for cardiac arrhythmias     Problem: Renal Instability  Goal: Free from infection  Flowsheets (Taken 05/29/2019 1323)  Free from infection: Monitor/assess for signs and symptoms of infection     Problem: Patient Receiving Advanced Renal Therapies  Goal: Therapy access site remains intact  Flowsheets (Taken 05/29/2019 1250 by Priscille Heidelberg, RN)  Therapy access site remains intact:   Assess therapy access site   Change therapy access site dressing as needed

## 2019-05-29 NOTE — Progress Notes (Addendum)
POST OPERATIVE PROGRESS NOTE  [facility]    Date Time: 05/29/19 11:46 AM  Patient Name: Rubi,Rie L      ASSESSMENT:   2 Days Post-Op S/P Procedure(s):  REMOVAL, PERITONEAL DIALYSIS CATHETER  EXPLORATORY LAPAROTOMY  Still with pain, tolerating clears  PLAN:   Advance diet as tolerated  Cultures pending  Dialysis per nephrology        SUBJECTIVE:   The patient is doing fairly well.  Post operative pain is not well controlled with medications.  Current dietary status:  Glucerna Supplement Quantity: A. One; Flavor: Strawberry; Frequency: Daily with lunch  Diet clear liquid and tolerating fairly well.  Flatus: no. BM:  no.  Additional complaints: none       OBJECTIVE:   Current Vitals:   Vitals:    05/29/19 1123   BP: 95/61   Pulse: 78   Resp:    Temp:    SpO2:        Intake and Output Summary (Last 24 hours):  I/O last 3 completed shifts:  In: 665 [P.O.:400; I.V.:265]  Out: 450 [Urine:450]    Labs:     Results     Procedure Component Value Units Date/Time    Blood Culture Aerobic/Anaerobic #1 [[id number]] Collected: 05/27/19 0507    Specimen: Arm from Blood, Venipuncture Updated: 05/29/19 1021    Narrative:      ORDER#: [id number]                                    ORDERED BY: CHUNG, SORA  SOURCE: Blood, Venipuncture arm                      COLLECTED:  05/27/19 05:07  ANTIBIOTICS AT COLL.:                                RECEIVED :  05/27/19 09:50  Culture Blood Aerobic and Anaerobic        PRELIM      05/29/19 10:21  05/28/19   No Growth after 1 day/s of incubation.  05/29/19   No Growth after 2 day/s of incubation.      Blood Culture Aerobic/Anaerobic #2 [[id number]] Collected: 05/27/19 0507    Specimen: Arm from Blood, Venipuncture Updated: 05/29/19 1021    Narrative:      ORDER#: [id number]                                    ORDERED BY: CHUNG, SORA  SOURCE: Blood, Venipuncture arm                      COLLECTED:  05/27/19 05:07  ANTIBIOTICS AT COLL.:                                RECEIVED :   05/27/19 09:50  Culture Blood Aerobic and Anaerobic        PRELIM      05/29/19 10:21  05/28/19   No Growth after 1 day/s of incubation.  05/29/19   No Growth after 2 day/s of incubation.      Wound culture & gram stain [[id number]] Collected: 05/27/19 2343    Specimen: Wound  Updated: 05/29/19 0941    Narrative:      blue swab  ORDER#: [id number]                                    ORDERED BY: Baird Cancer, ENOCH  SOURCE: Wound peritoneal fluid                       COLLECTED:  05/27/19 23:43  ANTIBIOTICS AT COLL.:                                RECEIVED :  05/28/19 07:56  ORDER ENTRY COMMENTS:  blue swab  Stain, Gram                                FINAL       05/28/19 13:06  05/28/19   Few WBCs             Rare Squamous epithelial cells             No organisms seen  Culture and Gram Stain, Aerobic, Wound     PRELIM      05/29/19 09:41   +  05/29/19   Light growth of Pseudomonas aeruginosa               Refer to susceptibilities on culture #[id number]        Wound culture & gram stain [[id number]] Collected: 05/27/19 0221    Specimen: Wound Updated: 05/29/19 0940    Narrative:      ORDER#: [id number]                                    ORDERED BY: Kalman Jewels  SOURCE: Wound abdominal wound                        COLLECTED:  05/27/19 02:21  ANTIBIOTICS AT COLL.:                                RECEIVED :  05/28/19 07:56  Stain, Gram                                FINAL       05/28/19 13:03  05/28/19   Moderate WBCs             Few Squamous epithelial cells             No organisms seen  Culture and Gram Stain, Aerobic, Wound     PRELIM      05/29/19 09:40   +  05/29/19   Light growth of Pseudomonas aeruginosa               Further workup to follow including susceptibility testing        Glucose Whole Blood - POCT [[id number]]  (Abnormal) Collected: 05/29/19 0807     Updated: 05/29/19 0820     Whole Blood Glucose POCT 137 mg/dL     Culture + Gram Stain,Aerobic, Body Fluid [[id number]] Collected: 05/27/19 0534    Specimen: Body  Fluid from  Paracentesis Fluid Updated: 05/29/19 0759    Narrative:      [id number] called Micro Results of Gram Stain. Results read back by: [id number], by  [id number] on 05/27/2019 at 14:31  < 49ml  ORDER#: [id number]                                    ORDERED BY: CHUNG, SORA  SOURCE: Paracentesis Fluid peritoneal dialysis cathetCOLLECTED:  05/27/19 05:34  ANTIBIOTICS AT COLL.:                                RECEIVED :  05/27/19 10:09  ORDER ENTRY COMMENTS:  < 11ml  [id number] called Micro Results of Gram Stain. Results read back by: [id number], by [id number] on 05/27/2019 at 14:31  Stain, Gram                                FINAL       05/27/19 14:31  05/27/19   No Squamous epithelial cells seen             No WBCs seen             Rare Gram positive cocci             Stain performed on Cytospin (concentrated) specimen  Culture and Gram Stain, Aerobic, Body FluidPRELIM      05/29/19 07:59  05/28/19   Culture no growth to date. Final report to follow  05/29/19   Culture no growth to date. Final report to follow      Glucose Whole Blood - POCT [[id number]]  (Abnormal) Collected: 05/28/19 2129     Updated: 05/28/19 2133     Whole Blood Glucose POCT 152 mg/dL     Glucose Whole Blood - POCT [[id number]]  (Abnormal) Collected: 05/28/19 1625     Updated: 05/28/19 1632     Whole Blood Glucose POCT 141 mg/dL           Rads:     Radiology Results (24 Hour)     ** No results found for the last 24 hours. **          Physical Exam:     Mental status - alert, oriented to person, place, and time, drowsy  Chest - clear to auscultation, no wheezes, rales or rhonchi, symmetric air entry, no tachypnea, retractions or cyanosis  Heart - normal rate, regular rhythm, normal S1, S2, no murmurs, rubs, clicks or gallops  Abdomen - obese, tender at incision  Wound - dressing, c/d/i    Extremities - peripheral pulses normal, no pedal edema, no clubbing or cyanosis      Signed by: Iran Sizer

## 2019-05-29 NOTE — UM Notes (Signed)
Primary Coverage: MEDICAID HMO/[place] COMPLETE CARE OF [place]  UTILIZATION REVIEW CONTACT : Shanon Payor BSN, RN, ACM  Utilization Review Case Manager II / Case Management  [facility]  [address]  [place], [place]  T [phone]  Jerilynn Mages [phone]   F [phone]    Email: [email or web address]  NPI: [id number] Tax ID:  [id number]    DIAGNOSIS    ICD-10-CM    1. Generalized abdominal pain  R10.84    2. Vomiting and diarrhea  R11.10     R19.7    3. End stage renal disease on dialysis  N18.6     Z99.2    4. Peritoneal dialysis catheter in place  Z99.2    5. Partial small bowel obstruction  K56.600          Admission Orders  Admit to Observation (Order [id number]) 05/27/19 [place] to Inpatient (Order [id number])  Diagnosis: Generalized Abdominal Pain    Level of Care: Step Down/ Prog Care [facility], [facility] ONLY    Patient Class: Inpatient       References: IAH Bed Placement [place] Bed Placement [place] Bed Placement [place] Bed Placement [place] Bed Placement Criteria   Question Answer Comment   Admitting Physician Reynaldo Minium E    Service: Medicine    Estimated Length of Stay > or = to 2 midnights    Tentative Discharge Plan? Home or Self Care    Does patient need telemetry? Yes    Telemetry type (separate Telemetry order is also required): Medical telemetry             PATIENT NAME: Yoak,Annamae L  DOB: 12/31/1955  PMH:  has a past medical history of Chronic obstructive pulmonary disease, Diabetes mellitus, Hyperlipidemia, Hypertension, and Sleep apnea (2018).  PSH:  has a past surgical history that includes [place] surgery (Left); Joint replacement; Joint replacement; EGD, BIOPSY (N/A, 05/14/2019); Tunneled Cath Placement (Permcath) (Bilateral, 05/23/2019); REMOVAL, FOREIGN BODY (N/A, 05/27/2019); and EXPLORATORY LAPAROTOMY (N/A, 05/27/2019).    ADMISSION REVIEW   History of present illness: Pt is a 64 y.o. female arrived at [facility] on  05/27/2019 at 0433.and admitted to 23S    Arrival VS: Vitals BP: 141/72, Temp: 99.8 F (37.7 C), Temp Source: Oral, Heart Rate: 82, Resp Rate: 16, SpO2: 97 %, Height: 157.5 cm (5\' 2" ), Weight: 113.4 kg (250 lb)      BP 96/60    Pulse 78    Temp 98.1 F (36.7 C) (Oral)    Resp 16    Ht 1.575 m (5\' 2" )    Wt 113.4 kg (250 lb)    LMP  (LMP Unknown) Comment: post menopausal   SpO2 97%    BMI 45.73 kg/m     Temp:  [97.7 F (36.5 C)-98.4 F (36.9 C)]   Heart Rate:  [71-80]   Resp Rate:  [15-18]   BP: (93-110)/(60-67)   SpO2:  [95 %-99 %]   Weight:  [113.4 kg (250 lb)]     Last recorded pain score:  Pain Scale Used: Numeric Scale (0-10)  Pain Score: 6-moderate pain         Abnormal Labs:   Lab Results last 48 Hours     Procedure Component Value Units Date/Time    Wound culture & gram stain [[id number]] Collected: 05/27/19 2343    Specimen: Wound Updated: 05/29/19 0941    Narrative:      blue swab  ORDER#: [id number]  ORDERED BY: SANDERS, ENOCH  SOURCE: Wound peritoneal fluid                       COLLECTED:  05/27/19 23:43  ANTIBIOTICS AT COLL.:                                RECEIVED :  05/28/19 07:56  ORDER ENTRY COMMENTS:  blue swab  Stain, Gram                                FINAL       05/28/19 13:06  05/28/19   Few WBCs             Rare Squamous epithelial cells             No organisms seen  Culture and Gram Stain, Aerobic, Wound     PRELIM      05/29/19 09:41   +  05/29/19   Light growth of Pseudomonas aeruginosa               Refer to susceptibilities on culture #[id number]        Wound culture & gram stain [[id number]] Collected: 05/27/19 0221    Specimen: Wound Updated: 05/29/19 0940    Narrative:      ORDER#: [id number]                                    ORDERED BY: Kalman Jewels  SOURCE: Wound abdominal wound                        COLLECTED:  05/27/19 02:21  ANTIBIOTICS AT COLL.:                                RECEIVED :  05/28/19 07:56  Stain, Gram                                 FINAL       05/28/19 13:03  05/28/19   Moderate WBCs             Few Squamous epithelial cells             No organisms seen  Culture and Gram Stain, Aerobic, Wound     PRELIM      05/29/19 09:40   +  05/29/19   Light growth of Pseudomonas aeruginosa               Further workup to follow including susceptibility testing        Glucose Whole Blood - POCT [[id number]]  (Abnormal) Collected: 05/29/19 0807     Updated: 05/29/19 0820     Whole Blood Glucose POCT 137 mg/dL     Culture + Gram Stain,Aerobic, Body Fluid [[id number]] Collected: 05/27/19 0534    Specimen: Body Fluid from Paracentesis Fluid Updated: 05/29/19 0759    Narrative:      [id number] called Micro Results of Gram Stain. Results read back by: [id number], by  [id number] on 05/27/2019 at 14:31  < 8ml  ORDER#: [id number]  ORDERED BY: CHUNG, SORA  SOURCE: Paracentesis Fluid peritoneal dialysis cathetCOLLECTED:  05/27/19 05:34  ANTIBIOTICS AT COLL.:                                RECEIVED :  05/27/19 10:09  ORDER ENTRY COMMENTS:  < 16ml  [id number] called Micro Results of Gram Stain. Results read back by: [id number], by [id number] on 05/27/2019 at 14:31  Stain, Gram                                FINAL       05/27/19 14:31  05/27/19   No Squamous epithelial cells seen             No WBCs seen             Rare Gram positive cocci             Stain performed on Cytospin (concentrated) specimen  Culture and Gram Stain, Aerobic, Body FluidPRELIM      05/29/19 07:59  05/28/19   Culture no growth to date. Final report to follow  05/29/19   Culture no growth to date. Final report to follow      Glucose Whole Blood - POCT [[id number]]  (Abnormal) Collected: 05/28/19 2129     Updated: 05/28/19 2133     Whole Blood Glucose POCT 152 mg/dL     Glucose Whole Blood - POCT [[id number]]  (Abnormal) Collected: 05/28/19 1625     Updated: 05/28/19 1632     Whole Blood Glucose POCT 141 mg/dL     Glucose Whole Blood - POCT [[id number]]  (Abnormal) Collected: 05/28/19 1208      Updated: 05/28/19 1211     Whole Blood Glucose POCT 193 mg/dL     Glucose Whole Blood - POCT [[id number]]  (Abnormal) Collected: 05/28/19 0819     Updated: 05/28/19 0823     Whole Blood Glucose POCT 198 mg/dL     GFR [[id number]] Collected: 05/28/19 0437     Updated: 05/28/19 0545     EGFR 13.2    Comprehensive metabolic panel [[id number]]  (Abnormal) Collected: 05/28/19 0437    Specimen: Blood Updated: 05/28/19 0545     Glucose 194 mg/dL      BUN 24.0 mg/dL      Creatinine 4.1 mg/dL      CO2 20 mEq/L      Calcium 7.1 mg/dL      Protein, Total 4.4 g/dL      Albumin 1.6 g/dL      Albumin/Globulin Ratio 0.6    CBC without differential [[id number]]  (Abnormal) Collected: 05/28/19 0437    Specimen: Blood Updated: 05/28/19 0501     WBC 12.77 x10 3/uL      Hgb 9.9 g/dL      Hematocrit 33.1 %      RBC 3.37 x10 6/uL      MCV 98.2 fL      MCHC 29.9 g/dL     Glucose Whole Blood - POCT [[id number]]  (Abnormal) Collected: 05/28/19 0047     Updated: 05/28/19 0048     Whole Blood Glucose POCT 144 mg/dL     Glucose Whole Blood - POCT [[id number]]  (Abnormal) Collected: 05/27/19 2242     Updated: 05/27/19 2255     Whole Blood Glucose POCT 135 mg/dL     Glucose Whole Blood - POCT [  [id number]]  (Abnormal) Collected: 05/27/19 2106     Updated: 05/27/19 2114     Whole Blood Glucose POCT 143 mg/dL     Glucose Whole Blood - POCT [[id number]]  (Abnormal) Collected: 05/27/19 1705     Updated: 05/27/19 1709     Whole Blood Glucose POCT 142 mg/dL               MD Notes:  MD note 05/28/2019  ASSESSMENT/PLAN   DOROTHIA PASSMORE is a 64 y.o. female admitted with Generalized abdominal pain  Interval Summary:   Patient Active Hospital Problem List:  Abdominal pain   -Likely due to PD peritonitis s/p PD removal   -Patient has been using IV antibiotic and was discharged on IV cefazolin. Continue IV cefazolin   -Lipase is normal, lactic acid normal  -Appreciate ID recs.   -Percocet for moderate pain  -Cultures pending, follow up   ESRD on peritoneal  dialysis  -Started on dialysis on 05/2018  -Continue HD   Hypertension  -Continue home amlodipine, coreg  Hyperglycemia  Type 2 DM  -Continue home lantus, SSI, ac/hs accuchecks  Hyperlipidemia   -Continue home statin  Nutrition: clears   DVT Prophylaxis: Heparin      Code Status: Full  DISPO: TBD     ID note 05/28/2019  Assessment and Plan:   1.  PD catheter peritonitis/E. Coli  2.  Old PD catheter removed.  Op note read.  Gram-positive cocci on Gram stain  Plan:   1.  Cefazolin for E. coli peritonitis from the first admission.  Hopefully will cover gram-positive cocci found on this admission.  2.  Continue p.o. vancomycin  MD note 05/29/2019  ASSESSMENT/PLAN     ELEANER DIBARTOLO is a 64 y.o. female admitted with Generalized abdominal pain    Interval Summary:     Patient Active Hospital Problem List:    Abdominal pain   -Likely due to PD peritonitis s/p PD removal   - Pseudomonas in peritoneal fluid cx from 5/4. E coli in cx from 4/20 on prior admission  -Patient has been using IV antibiotic and was discharged on IV cefazolin.   - Abx changed to Zosyn on 5/6 to cover pseudomonas  - probiotics  -Lipase is normal, lactic acid normal  -Appreciate ID recs.   -Percocet for moderate pain  -Cultures pending, follow up     ESRD on peritoneal dialysis  -Started on dialysis on 05/2018  -Continue HD     Hypertension  -Continue home amlodipine, coreg    Hyperglycemia  Type 2 DM  -Continue home lantus, SSI, ac/hs accuchecks    Hyperlipidemia   -Continue home statin    Nutrition: clears   DVT Prophylaxis: Heparin      Code Status: Full  DISPO: TBD     ID note 05/29/2019  Assessment and Plan:   1.  PD catheter peritonitis/E. Coli  2.  Old PD catheter removed./Pseudomonas and intraoperative culture  Plan:   1.  Change antibiotic to Zosyn to cover both Pseudomonas and E. coli previously cultured  2.  Continue p.o. vancomycin      Medications: Scheduled Meds:  Current Facility-Administered Medications   Medication Dose  Route Frequency    amLODIPine  5 mg Oral Q12H    atorvastatin  40 mg Oral Daily    carvedilol  25 mg Oral Q12H SCH    darbepoetin alfa  60 mcg Subcutaneous Weekly    docusate sodium  100 mg Oral Daily  heparin (porcine)  5,000 Units Subcutaneous Q12H SCH    insulin glargine  10 Units Subcutaneous QAM    insulin lispro  1-8 Units Subcutaneous TID AC    pantoprazole  40 mg Oral QAM AC    piperacillin-tazobactam  2.25 g Intravenous Q12H    sevelamer  800 mg Oral TID MEALS    vancomycin  125 mg Oral Q6H     Continuous Infusions:   sodium chloride 20 mL/hr at 05/27/19 1100    sodium chloride      lactated ringers       PRN Meds:.sodium chloride, acetaminophen, bisacodyl, Nursing communication: Adult Hypoglycemia Treatment Algorithm **AND** dextrose **AND** dextrose **AND** glucagon (rDNA), diphenhydrAMINE, morphine, naloxone, ondansetron, ondansetron **OR** ondansetron, oxyCODONE **OR** oxyCODONE, petrolatum

## 2019-05-29 NOTE — Nursing Progress Note (Signed)
Pt is off the unit for HD.Transported via bed. Right chest permacath site CDI. No signs of bleeding or drainage noted.  No signs of distress noted. Tolerated am meds well. Medicated for pain to mid abdomen with IV morphine. Reported relief. Medicated with PRN benadryl po for generalized  Itchiness. Continues on clear liquids. No BM thus far.

## 2019-05-29 NOTE — Progress Notes (Signed)
Arrived via bed. Pt. Alert and oriented x3. S/p removal of peritoneal dialysis catheter on 05-28-19. Dressing dry and intact. C/o pain in abdomen at level 8. Medicated with pain med prior to coming to dialysis. Dressing to permcath dry and intact. Ports aspirate well. Telemetry SR. Afebrile. Lungs clear. No sob noted. Bilateral lower extremity edema 1+. Consent on file. Report received from [place]     05/29/19 1247   Dialysis Weight   Pre-Treatment Weight (Kg) 110.5   Scale Type ICU Bed Scale   Vitals   Temp 98.6 F (37 C)   Heart Rate 77   Resp Rate 16   BP 104/47   Assessment   Mental Status Alert;Oriented   Cardiac (WDL) WDL   Cardiac Regularity Regular   Cardiac Rhythm Normal Sinus Rhythm   Respiratory  WDL   Respiratory Pattern Regular   Edema  X   RLE Edema +1   LLE Edema +1   General Skin Color Appropriate for ethnicity   Skin Condition/Temp Warm   Gastrointestinal (WDL) WDL   Abdomen Inspection Soft;Rounded   GI Symptoms None   Mobility Ambulatory with Assistance   Permacath/Temporary Catheter 05/23/19 Permacath Right   Placement Date/Time: 05/23/19 1342   Inserted by: Papadouris  Access Type: Permacath  Orientation: Right  Access Location: Tunneled, Chest  Catheter Tip Location: svc  Central Line Infection Prevention Education provided?: Yes;No  Hand Hygiene: [place]...   Dressing Status and Intervention Dressing Intact;Clean & Dry   Pain Assessment   Charting Type Assessment   Pain Scale Used Numeric Scale (0-10)   Numeric Pain Scale   Pain Score 8   POSS Score 1   Pain Location Abdomen   Pain Orientation Mid   Pain Descriptors Aching   Pain Frequency Continuous;Increases with movement   Hemodialysis Comments   Pre-Hemodialysis Comments   (TIME OUT DONE. CONSENT ON FILE)

## 2019-05-29 NOTE — Progress Notes (Signed)
[facility]  PROGRESS NOTE      Patient: [name]  Date: 05/29/2019   LOS: 0 Days  Admission Date: 05/27/2019   MRN: [id number]  Attending: Delfina Redwood  Please contact me on Epic Chat      ASSESSMENT/PLAN     ASUSENA SIGLEY is a 64 y.o. female admitted with Generalized abdominal pain    Interval Summary:     Patient Active Hospital Problem List:    Abdominal pain   -Likely due to PD peritonitis s/p PD removal   - Pseudomonas in peritoneal fluid cx from 5/4. E coli in cx from 4/20 on prior admission  -Patient has been using IV antibiotic and was discharged on IV cefazolin.   - Abx changed to Zosyn on 5/6 to cover pseudomonas  - probiotics  -Lipase is normal, lactic acid normal  -Appreciate ID recs.   -Percocet for moderate pain  -Cultures pending, follow up     ESRD on peritoneal dialysis  -Started on dialysis on 05/2018  -Continue HD     Hypertension  -Continue home amlodipine, coreg    Hyperglycemia  Type 2 DM  -Continue home lantus, SSI, ac/hs accuchecks    Hyperlipidemia   -Continue home statin    Nutrition: clears   DVT Prophylaxis: Heparin      Code Status: Full  DISPO: TBD     SUBJECTIVE     Euva L Topel seen and examined at bedside   Denies cp, sob, no fever  Continues to report abdominal pain around op site  Denies nausea/vomiting     MEDICATIONS     Current Facility-Administered Medications   Medication Dose Route Frequency    amLODIPine  5 mg Oral Q12H    atorvastatin  40 mg Oral Daily    carvedilol  25 mg Oral Q12H [place]    ceFAZolin  2 g Intravenous Daily after dinner    darbepoetin alfa  60 mcg Subcutaneous Weekly    docusate sodium  100 mg Oral Daily    heparin (porcine)  5,000 Units Subcutaneous Q12H [place]    insulin glargine  10 Units Subcutaneous QAM    insulin lispro  1-8 Units Subcutaneous TID AC    pantoprazole  40 mg Oral QAM AC    sevelamer  800 mg Oral TID MEALS    vancomycin  125 mg Oral Q6H       PHYSICAL EXAM     Vitals:    05/29/19 0804   BP: 96/60    Pulse: 78   Resp: 16   Temp: 98.1 F (36.7 C)   SpO2: 97%       Temperature: Temp  Min: 97.7 F (36.5 C)  Max: 98.4 F (36.9 C)  Pulse: Pulse  Min: 71  Max: 80  Respiratory: Resp  Min: 15  Max: 18  Non-Invasive BP: BP  Min: 93/62  Max: 110/67  Pulse Oximetry SpO2  Min: 95 %  Max: 99 %    Intake and Output Summary (Last 24 hours) at Date Time    Intake/Output Summary (Last 24 hours) at 05/29/2019 0833  Last data filed at 05/28/2019 2200  Gross per 24 hour   Intake 345 ml   Output 200 ml   Net 145 ml       GEN APPEARANCE: Normal;  A&OX3  HEENT: PERLA; EOMI; Conjunctiva Clear  NECK: Supple; No bruits  CVS: RRR, S1, S2; No M/G/R  LUNGS: CTAB; No Wheezes; No Rhonchi: No rales  ABD: Soft; No TTP; + Normoactive BS  EXT: No edema; Pulses 2+ and intact  Skin exam:  pink  NEURO: non focal, moves all extremities   CAP REFILL:  Normal  MENTAL STATUS:  Normal        LABS     Recent Labs   Lab 05/28/19  0437 05/27/19  0507 05/26/19  0315   WBC 12.77* 15.22* 11.52*   RBC 3.37* 3.54* 3.20*   Hgb 9.9* 10.4* 9.5*   Hematocrit 33.1* 34.1* 31.1*   MCV 98.2* 96.3* 97.2*   Platelets 149 165 157       Recent Labs   Lab 05/28/19  0437 05/27/19  0507 05/26/19  0315 05/25/19  0418 05/24/19  0337   Sodium 137 139 141 142 141   Potassium 4.7 4.1 3.8 3.9 3.7   Chloride 105 103 105 105 101   CO2 20* 28 27 30* 31*   BUN 24.0* 13.0 23.0* 16.0 26.0*   Creatinine 4.1* 3.0* 4.0* 3.4* 5.0*   Glucose 194* 155* 127* 113* 253*   Calcium 7.1* 7.8* 7.5* 7.7* 8.0*       Recent Labs   Lab 05/28/19  0437 05/27/19  0507 05/26/19  0315   ALT <6 <6 <6   AST (SGOT) 7 9 13    Bilirubin, Total 0.4 1.0 0.5   Albumin 1.6* 2.1* 1.9*   Alkaline Phosphatase 95 108* 92                   Microbiology Results     Procedure Component Value Units Date/Time    Anaerobic culture [[id number]] Collected: 05/27/19 2343    Specimen: Other from Wound Updated: 05/28/19 0756    Anaerobic culture [[id number]] Collected: 05/27/19 2343    Specimen: Other from Fluid Updated: 05/28/19 0756     Blood Culture Aerobic/Anaerobic #1 [[id number]] Collected: 05/27/19 0507    Specimen: Arm from Blood, Venipuncture Updated: 05/28/19 1021    Narrative:      ORDER#: [id number]                                    ORDERED BY: CHUNG, SORA  SOURCE: Blood, Venipuncture arm                      COLLECTED:  05/27/19 05:07  ANTIBIOTICS AT COLL.:                                RECEIVED :  05/27/19 09:50  Culture Blood Aerobic and Anaerobic        PRELIM      05/28/19 10:21  05/28/19   No Growth after 1 day/s of incubation.      Blood Culture Aerobic/Anaerobic #2 [[id number]] Collected: 05/27/19 0507    Specimen: Arm from Blood, Venipuncture Updated: 05/28/19 1021    Narrative:      ORDER#: [id number]                                    ORDERED BY: CHUNG, SORA  SOURCE: Blood, Venipuncture arm                      COLLECTED:  05/27/19 05:07  ANTIBIOTICS AT COLL.:  RECEIVED :  05/27/19 09:50  Culture Blood Aerobic and Anaerobic        PRELIM      05/28/19 10:21  05/28/19   No Growth after 1 day/s of incubation.      COVID-19 (SARS-COV-2) [employer] [facility]) [[id number]] Collected: 05/27/19 0624    Specimen: Nasopharyngeal Swab from Nasopharynx Updated: 05/27/19 0656     Purpose of COVID testing Screening     SARS-CoV-2 Specimen Source Nasopharyngeal     SARS CoV 2 Overall Result Negative     Comment: Test performed using the Abbott ID NOW EUA assay.  Please see Fact Sheets for patients and providers located at:  http://olson-hall.info/  This test is for the qualitative detection of SARS-CoV-2  (COVID19) nucleic acid. Viral nucleic acids may persist in vivo,  independent of viability. Detection of viral nucleic acid does  not imply the presence of infectious virus, or that virus  nucleic acid is the cause of clinical symptoms. Negative  results should be treated as presumptive and, if inconsistent  with clinical signs and symptoms or necessary for patient  management, should be tested with an  alternative molecular  assay. Negative results do not preclude SARS-CoV-2 infection  and should not be used as the sole basis for patient  management decisions. Invalid results may be due to inhibiting  substances in the specimen and recollection should occur.         Narrative:      o Collect and clearly label specimen type:  o Upper respiratory specimen: One Nasopharyngeal Dry Swab NO  Transport Media.  o Hand deliver to laboratory ASAP  Indication for testing->Extended care facility admission to  semi private room    Culture + Gram Lenise Arena, Body Fluid [[id number]] Collected: 05/27/19 0534    Specimen: Body Fluid from Paracentesis Fluid Updated: 05/28/19 0719    Narrative:      [id number] called Micro Results of Gram Stain. Results read back by: [id number], by  [id number] on 05/27/2019 at 14:31  < 20ml  ORDER#: [id number]                                    ORDERED BY: CHUNG, SORA  SOURCE: Paracentesis Fluid peritoneal dialysis cathetCOLLECTED:  05/27/19 05:34  ANTIBIOTICS AT COLL.:                                RECEIVED :  05/27/19 10:09  ORDER ENTRY COMMENTS:  < 63ml  [id number] called Micro Results of Gram Stain. Results read back by: [id number], by [id number] on 05/27/2019 at 14:31  Stain, Gram                                FINAL       05/27/19 14:31  05/27/19   No Squamous epithelial cells seen             No WBCs seen             Rare Gram positive cocci             Stain performed on Cytospin (concentrated) specimen  Culture and Gram Stain, Aerobic, Body FluidPRELIM      05/28/19 07:19  05/28/19   Culture no growth to date. Final report to follow      Fungus culture [  [id number]] Collected: 05/27/19 2343    Specimen: Other from Tissue Updated: 05/28/19 0756    Fungus culture [[id number]] Collected: 05/27/19 0259    Specimen: Other from Fluid Updated: 05/28/19 0756    Wound culture & gram stain [[id number]] Collected: 05/27/19 2343    Specimen: Wound Updated: 05/28/19 1306    Narrative:      blue swab  ORDER#: [id number]                                     ORDERED BY: Baird Cancer, ENOCH  SOURCE: Wound peritoneal fluid                       COLLECTED:  05/27/19 23:43  ANTIBIOTICS AT COLL.:                                RECEIVED :  05/28/19 07:56  ORDER ENTRY COMMENTS:  blue swab  Stain, Gram                                FINAL       05/28/19 13:06  05/28/19   Few WBCs             Rare Squamous epithelial cells             No organisms seen  Culture and Gram Stain, Aerobic, Wound     PENDING      Wound culture & gram stain [[id number]] Collected: 05/27/19 0221    Specimen: Wound Updated: 05/28/19 1303    Narrative:      ORDER#: [id number]                                    ORDERED BY: Kalman Jewels  SOURCE: Wound abdominal wound                        COLLECTED:  05/27/19 02:21  ANTIBIOTICS AT COLL.:                                RECEIVED :  05/28/19 07:56  Stain, Gram                                FINAL       05/28/19 13:03  05/28/19   Moderate WBCs             Few Squamous epithelial cells             No organisms seen  Culture and Gram Stain, Aerobic, Wound     PENDING             RADIOLOGY     CT Abd/Pelvis with IV Contrast only    Result Date: 05/27/2019  1. Fluid-filled mildly distended loops of small bowel with distal decompressed small bowel loops. Findings may represent early or partial small bowel obstruction. Enteritis is felt to be less likely. 2. Trace bilateral pleural effusions. 3. Additional chronic findings as above. Delaney Meigs, MD  05/27/2019 6:07 AM    Tunneled Cath Placement (Permcath)  Result Date: 05/23/2019   Technically successful placement of an  23cm Equistream catheter via the right internal jugular vein as described above. Phlebitic changes in the right internal jugular vein from prior catheter placement. Dimitrios Papadouris, MD  05/23/2019 3:51 PM        Signed,  Delfina Redwood  8:33 AM 05/29/2019

## 2019-05-29 NOTE — PT Progress Note (Signed)
Physical Therapy Note  [facility]  Physical Therapy Attempt Note    Patient:  [name] MRN#:  [id number]  Unit:  [address] [facility] Room/Bed:  E7841/Q8208.B      Physical Therapy treatment attempted on 05/29/2019 at 6:03 PM unable to complete secondary to Patient not appropriate for therapy treatment at this time 2/2 "lot of pain today". RN by bedside, providing tx.    RN aware.  Jake Seats, PT  05/29/2019 6:05 PM

## 2019-05-29 NOTE — Progress Notes (Signed)
uf of 2 liters as ordered. Dressing to right permcath changed with the assist of Arlene Bassig RN. Site without redness or drainage. BFR of catheter approx 250. Will notify MD. Lungs clear. No sob. C/o of abdominal soreness continue. At level 6 at present. Remains alert and oriented x3. Telemetry SR. Report given to Priscille Heidelberg RN     05/29/19 1627   Treatment Summary   Time Off Machine 1630   Duration of Treatment (Hours) 3.5   Dialyzer Clearance Heavily streaked   Fluid Volume Off (mL) 2400   Prime Volume (mL) 200   Rinseback Volume (mL) 200   Fluid Given: Normal Saline (mL) 0   Fluid Given: PRBC  0 mL   Fluid Given: Albumin (mL) 0   Fluid Given: Other (mL) 0   Total Fluid Given 400   Hemodialysis Net Fluid Removed 2000   Post Treatment Assessment   Post-Treatment Weight (Kg) 108.5   Patient Response to Treatment   (tolerated well)   Permacath/Temporary Catheter 05/23/19 Permacath Right   Placement Date/Time: 05/23/19 1342   Inserted by: Papadouris  Access Type: Permacath  Orientation: Right  Access Location: Tunneled, Chest  Catheter Tip Location: svc  Central Line Infection Prevention Education provided?: Yes;No  Hand Hygiene: [place]...   Catheter Lumen Volume Venous 1.7 mL   Catheter Lumen Volume Arterial 1.7 mL   Dressing Status and Intervention New Dressing   Tego Caps on Catheter Yes   NEW Tego Caps placed (Date) 05/29/19   Vitals   Temp 98.6 F (37 C)   Heart Rate 74   Resp Rate 16   BP 147/75   Assessment   Mental Status Alert;Oriented   Cardiac (WDL) WDL   Cardiac Regularity Regular   Cardiac Rhythm Normal Sinus Rhythm   Respiratory  WDL   Respiratory Pattern Regular;Easy   Edema  X   RLE Edema +1   LLE Edema +1   General Skin Color Appropriate for ethnicity   Skin Condition/Temp Warm   Gastrointestinal (WDL) WDL   Abdomen Inspection Soft   GI Symptoms None   Mobility Ambulatory with Assistance   Pain Assessment   Charting Type Reassessment   Pain Scale Used Numeric Scale (0-10)   Numeric Pain Scale    Pain Score 6   POSS Score 1   Pain Location Abdomen   Pain Orientation Mid   Pain Descriptors Aching   Pain Frequency Intermittent;Increases with movement   Education   Person taught Patient   Knowledge basis Minimal   Topics taught Access care   [place] Understanding   Bedside Nurse Communication   Name of bedside RN - post dialysis   (NDEYE KEITA RN)

## 2019-05-29 NOTE — Plan of Care (Signed)
Problem: Renal Instability  Goal: Fluid and electrolyte balance are achieved/maintained  Outcome: Progressing  Flowsheets (Taken 05/29/2019 1250)  Fluid and electrolyte balance are achieved/maintained:   Monitor intake and output every shift   Provide adequate hydration   Assess and reassess fluid and electrolyte status   Monitor/assess lab values and report abnormal values     Problem: Patient Receiving Advanced Renal Therapies  Goal: Therapy access site remains intact  Outcome: Progressing  Flowsheets (Taken 05/29/2019 1250)  Therapy access site remains intact:   Assess therapy access site   Change therapy access site dressing as needed     Problem: Altered GI Function  Goal: Fluid and electrolyte balance are achieved/maintained  Outcome: Progressing  Flowsheets (Taken 05/28/2019 2302 by Gavin Pound, RN)  Fluid and electrolyte balance are achieved/maintained:   Monitor intake and output every shift   Monitor/assess lab values and report abnormal values   Provide adequate hydration   Monitor daily weight  Goal: Elimination patterns are normal or improving  Outcome: Progressing  Flowsheets (Taken 05/27/2019 1836 by Loleta Rose, RN)  Elimination patterns are normal or improving:   Anticipate/assist with toileting needs   Assess for normal bowel sounds   Monitor for abdominal discomfort   Assess for signs and symptoms of bleeding.  Report signs of bleeding to physician   Monitor for abdominal distension  Goal: Nutritional intake is adequate  Outcome: Progressing  Flowsheets (Taken 05/29/2019 1249)  Nutritional intake is adequate:   Monitor daily weights   Allow adequate time for meals   Assist patient with meals/food selection  Goal: Mobility/Activity is maintained at optimal level for patient  Outcome: Progressing  Flowsheets (Taken 05/29/2019 1249)  Mobility/activity is maintained at optimal level for patient:   Increase mobility as tolerated/progressive mobility   Encourage independent activity per ability    Maintain proper body alignment     Problem: Pain  Goal: Pain at adequate level as identified by patient  Outcome: Progressing  Flowsheets (Taken 05/28/2019 2302 by Gavin Pound, RN)  Pain at adequate level as identified by patient:   Identify patient comfort function goal   Assess for risk of opioid induced respiratory depression, including snoring/sleep apnea. Alert healthcare team of risk factors identified.   Assess pain on admission, during daily assessment and/or before any "as needed" intervention(s)   Reassess pain within 30-60 minutes of any procedure/intervention, per Pain Assessment, Intervention, Reassessment (AIR) Cycle   Evaluate if patient comfort function goal is met   Evaluate patient's satisfaction with pain management progress   Consult/collaborate with Pain Service   Offer non-pharmacological pain management interventions

## 2019-05-29 NOTE — Progress Notes (Signed)
INFECTIOUS DISEASES PROGRESS NOTE    Date Time: 05/29/19 2:13 PM  Patient Name: [name],[name]  Patient status.Inpatient  Hospital Day: 0    Assessment and Plan:   1.  PD catheter peritonitis/E. Coli  2.  Old PD catheter removed./Pseudomonas and intraoperative culture        Plan: 1.  Change antibiotic to Zosyn to cover both Pseudomonas and E. coli previously cultured  2.  Continue p.o. vancomycin    Subjective:   Nontoxic    Review of Systems:   Review of Systems -abdominal pain improved since yesterday    Antibiotics:   As above    Other medications reviewed in EPIC.  Central Access:   New dialysis access day 3    Physical Exam:   T-max 98.6  Vitals:    05/29/19 1400   BP: 155/69   Pulse: 74   Resp:    Temp:    SpO2:        Chest - clear to auscultation, no wheezes, rales or rhonchi, symmetric air entry  Heart - normal rate, regular rhythm, normal S1, S2, no murmurs, rubs, clicks or gallops  Abdomen - No bowel sounds heard.  She remains tender in the right lower quadrant    Labs:     Microbiology Results     Procedure Component Value Units Date/Time    Blood Culture Aerobic/Anaerobic #1 [[id number]] Collected: 05/27/19 0507    Specimen: Arm from Blood, Venipuncture Updated: 05/27/19 0950    Narrative:      1 BLUE+1 PURPLE    Blood Culture Aerobic/Anaerobic #2 [[id number]] Collected: 05/27/19 0507    Specimen: Arm from Blood, Venipuncture Updated: 05/27/19 0950    Narrative:      1 BLUE+1 PURPLE    COVID-19 (SARS-COV-2) [employer] [facility]) [[id number]] Collected: 05/27/19 0624    Specimen: Nasopharyngeal Swab from Nasopharynx Updated: 05/27/19 0656     Purpose of COVID testing Screening     SARS-CoV-2 Specimen Source Nasopharyngeal     SARS CoV 2 Overall Result Negative     Comment: Test performed using the Abbott ID NOW EUA assay.  Please see Fact Sheets for patients and providers located at:  http://olson-hall.info/  This test is for the qualitative detection of SARS-CoV-2  (COVID19) nucleic acid. Viral  nucleic acids may persist in vivo,  independent of viability. Detection of viral nucleic acid does  not imply the presence of infectious virus, or that virus  nucleic acid is the cause of clinical symptoms. Negative  results should be treated as presumptive and, if inconsistent  with clinical signs and symptoms or necessary for patient  management, should be tested with an alternative molecular  assay. Negative results do not preclude SARS-CoV-2 infection  and should not be used as the sole basis for patient  management decisions. Invalid results may be due to inhibiting  substances in the specimen and recollection should occur.         Narrative:      o Collect and clearly label specimen type:  o Upper respiratory specimen: One Nasopharyngeal Dry Swab NO  Transport Media.  o Hand deliver to laboratory ASAP  Indication for testing->Extended care facility admission to  semi private room    Culture + Gram Lenise Arena, Body Fluid [[id number]] Collected: 05/27/19 0534    Specimen: Body Fluid from Paracentesis Fluid Updated: 05/27/19 1009          CBC w/Diff CMP   Recent Labs   Lab 05/29/19  1302 05/28/19  4765 05/27/19  0507   WBC 2.12* 12.77* 15.22*   Hgb 7.6* 9.9* 10.4*   Hematocrit 26.1* 33.1* 34.1*   Platelets 116* 149 165   MCV 97.8* 98.2* 96.3*   Neutrophils  --   --  86.8       PT/INR         Recent Labs   Lab 05/29/19  1302 05/28/19  0437 05/27/19  0507   Sodium 136 137 139   Potassium 4.0 4.7 4.1   Chloride 102 105 103   CO2 26 20* 28   BUN 29.0* 24.0* 13.0   Creatinine 5.0* 4.1* 3.0*   Glucose 146* 194* 155*   Calcium 6.9* 7.1* 7.8*   Protein, Total 4.2* 4.4* 5.4*   Albumin 1.4* 1.6* 2.1*   AST (SGOT) 10 7 9    ALT <6 <6 <6   Alkaline Phosphatase 89 95 108*   Bilirubin, Total 0.2 0.4 1.0      Glucose POCT   Recent Labs   Lab 05/29/19  1302 05/28/19  0437 05/27/19  0507 05/26/19  0315 05/25/19  0418 05/24/19  0337 05/23/19  0451   Glucose 146* 194* 155* 127* 113* 253* 316*          Rads:     Radiology Results  (24 Hour)     ** No results found for the last 24 hours. **            Signed by: Ernst Breach

## 2019-05-30 ENCOUNTER — Ambulatory Visit (INDEPENDENT_AMBULATORY_CARE_PROVIDER_SITE_OTHER): Payer: Medicaid Other | Admitting: Emergency Medicine

## 2019-05-30 LAB — ECG 12-LEAD
Atrial Rate: 78 {beats}/min
P Axis: 52 degrees
P-R Interval: 144 ms
Q-T Interval: 402 ms
QRS Duration: 74 ms
QTC Calculation (Bezet): 458 ms
R Axis: 21 degrees
T Axis: 62 degrees
Ventricular Rate: 78 {beats}/min

## 2019-05-30 LAB — COMPREHENSIVE METABOLIC PANEL
ALT: <6 U/L (ref 0–55)
AST (SGOT): 10 U/L (ref 5–34)
Albumin/Globulin Ratio: 0.4 — ABNORMAL LOW (ref 0.9–2.2)
Albumin: 1.4 g/dL — ABNORMAL LOW (ref 3.5–5.0)
Alkaline Phosphatase: 99 U/L (ref 37–106)
Anion Gap: 12 (ref 5.0–15.0)
BUN: 20 mg/dL — ABNORMAL HIGH (ref 7.0–19.0)
Bilirubin, Total: 0.3 mg/dL (ref 0.2–1.2)
CO2: 26 mEq/L (ref 22–29)
Calcium: 7 mg/dL — ABNORMAL LOW (ref 8.5–10.5)
Chloride: 101 mEq/L (ref 100–111)
Creatinine: 4.3 mg/dL — ABNORMAL HIGH (ref 0.6–1.0)
Globulin: 3.3 g/dL (ref 2.0–3.6)
Glucose: 112 mg/dL — ABNORMAL HIGH (ref 70–100)
Potassium: 3.7 mEq/L (ref 3.5–5.1)
Protein, Total: 4.7 g/dL — ABNORMAL LOW (ref 6.0–8.3)
Sodium: 139 mEq/L (ref 136–145)

## 2019-05-30 LAB — CBC
Absolute NRBC: 0 10*3/uL (ref 0.00–0.00)
Hematocrit: 27.2 % — ABNORMAL LOW (ref 34.7–43.7)
Hgb: 7.8 g/dL — ABNORMAL LOW (ref 11.4–14.8)
MCH: 28.3 pg (ref 25.1–33.5)
MCHC: 28.7 g/dL — ABNORMAL LOW (ref 31.5–35.8)
MCV: 98.6 fL — ABNORMAL HIGH (ref 78.0–96.0)
MPV: 12.5 fL (ref 8.9–12.5)
Nucleated RBC: 0 /100 WBC (ref 0.0–0.0)
Platelets: 125 10*3/uL — ABNORMAL LOW (ref 142–346)
RBC: 2.76 10*6/uL — ABNORMAL LOW (ref 3.90–5.10)
RDW: 13 % (ref 11–15)
WBC: 7.38 10*3/uL (ref 3.10–9.50)

## 2019-05-30 LAB — GLUCOSE WHOLE BLOOD - POCT
Whole Blood Glucose POCT: 103 mg/dL — ABNORMAL HIGH (ref 70–100)
Whole Blood Glucose POCT: 137 mg/dL — ABNORMAL HIGH (ref 70–100)
Whole Blood Glucose POCT: 111 mg/dL — ABNORMAL HIGH (ref 70–100)
Whole Blood Glucose POCT: 106 mg/dL — ABNORMAL HIGH (ref 70–100)

## 2019-05-30 LAB — GFR: EGFR: 12.5

## 2019-05-30 LAB — HEMOLYSIS INDEX: Hemolysis Index: -1 — ABNORMAL LOW (ref 0–18)

## 2019-05-30 MED ORDER — ACETAMINOPHEN 500 MG PO TABS
1000.0000 mg | ORAL_TABLET | Freq: Three times a day (TID) | ORAL | Status: DC
Start: 2019-05-30 — End: 2019-05-31
  Administered 2019-05-30 – 2019-05-31 (×4): 1000 mg via ORAL
  Filled 2019-05-30 (×4): qty 2

## 2019-05-30 MED ORDER — PIPERACILLIN-TAZOBACTAM 2.25 GM MBP (CNR)
2.25 g | Freq: Three times a day (TID) | INTRAVENOUS | Status: DC
Start: 2019-05-30 — End: 2019-06-01
  Administered 2019-05-30 – 2019-06-01 (×6): 2250 mg via INTRAVENOUS
  Filled 2019-05-30 (×8): qty 2250

## 2019-05-30 MED ORDER — POTASSIUM CHLORIDE CRYS ER 20 MEQ PO TBCR
40.00 meq | EXTENDED_RELEASE_TABLET | Freq: Once | ORAL | Status: AC
Start: 2019-05-30 — End: 2019-05-30
  Administered 2019-05-30: 10:00:00 40 meq via ORAL
  Filled 2019-05-30: qty 2

## 2019-05-30 NOTE — Nursing Progress Note (Addendum)
Medicine Nursing Note    Date Time: 05/30/19 1:13 AM  Patient Name: [name]  Attending Physician: Delfina Redwood, MD    Assessment     Shift event: Pain management, IV and PO antibiotic    Neuro: AOx4, calm and cooperative    Tele/CV/CP 24 hr Tele, VSS, NC 3 L, Afebrile   Foley/GU NA Pt voiding    BMs/GI Last BM 5/4 , on clear liquid diet- tolerated well    Skin: Scars and brusing   Isolation: NA   Pain: c/o pain/discomfort of abdomen and back as well as itching of back and extremity     Mews: 1    Central Line: Perm catheter    Safety:  Placed ___high ____ fall risk. Safety maintained, purposeful rounding performed, Call bell within reach.    Interpreter: Pt speaks English and denies interpreter needs.   Comments:    Pt c/o pain and PRN medication given.   Medication given per order.   IV fluid infusing.     Provided comfort including reposition, pain medication and ice pack.   Oral fluid offered. Hygiene supply provided.   SCD on.  Encouraged pt gets out bed and pt declined due to pain.   IS encouraged to use as well.    No c/o cough and SOB.  Declined nausea and vomiting.   No acute distress noted with breathing.      The whiteboard updated with current team information.   The belongings at bedside.   Discussed and explained the care plan for the night.   Explained and educated pt on safety precaution.    Shift report given to incoming RN at bedside.

## 2019-05-30 NOTE — Plan of Care (Signed)
Problem: Pain  Goal: Pain at adequate level as identified by patient  Outcome: Progressing  Flowsheets (Taken 05/28/2019 2302 by Gavin Pound, RN)  Pain at adequate level as identified by patient:   Identify patient comfort function goal   Assess for risk of opioid induced respiratory depression, including snoring/sleep apnea. Alert healthcare team of risk factors identified.   Assess pain on admission, during daily assessment and/or before any "as needed" intervention(s)   Reassess pain within 30-60 minutes of any procedure/intervention, per Pain Assessment, Intervention, Reassessment (AIR) Cycle   Evaluate if patient comfort function goal is met   Evaluate patient's satisfaction with pain management progress   Consult/collaborate with Pain Service   Offer non-pharmacological pain management interventions     Problem: Altered GI Function  Goal: Fluid and electrolyte balance are achieved/maintained  Outcome: Progressing  Flowsheets (Taken 05/28/2019 2302 by Gavin Pound, RN)  Fluid and electrolyte balance are achieved/maintained:   Monitor intake and output every shift   Monitor/assess lab values and report abnormal values   Provide adequate hydration   Monitor daily weight  Goal: Elimination patterns are normal or improving  Outcome: Progressing  Flowsheets (Taken 05/27/2019 1836 by Loleta Rose, RN)  Elimination patterns are normal or improving:   Anticipate/assist with toileting needs   Assess for normal bowel sounds   Monitor for abdominal discomfort   Assess for signs and symptoms of bleeding.  Report signs of bleeding to physician   Monitor for abdominal distension  Goal: Nutritional intake is adequate  Outcome: Progressing  Flowsheets (Taken 05/29/2019 1249)  Nutritional intake is adequate:   Monitor daily weights   Allow adequate time for meals   Assist patient with meals/food selection     Problem: Patient Receiving Advanced Renal Therapies  Goal: Therapy access site remains  intact  Outcome: Progressing  Flowsheets (Taken 05/29/2019 1250)  Therapy access site remains intact:   Assess therapy access site   Change therapy access site dressing as needed

## 2019-05-30 NOTE — Consults (Signed)
[facility]  CONSULT  Aaron Edelman, x [phone] ([place])      Date Time: 05/30/19 10:15 AM  Patient Name: [name]  Requesting Physician: Delfina Redwood, MD  Consulting Physician: Verl Blalock, MD    Primary Care Physician: Pcp, None, MD    Reason for Consultation: ESRD, provision of hemodialysis      Assessment:     Patient Active Problem List   Diagnosis    Iron deficiency anemia secondary to inadequate dietary iron intake    Sideropenic dysphagia    Peritoneal dialysis catheter dysfunction, initial encounter    Peritonitis    Upper GI bleed    ESRD (end stage renal disease) on dialysis    Generalized abdominal pain     1. ESRD, on PD until 5/3 when she was switched to HD   - HD on 5/3   - plan for d/c and next HD at [place] on 5/6, however patient remains in-house, so dialyzed at [facility] on 5/6  2. Abdominal pain due to PD associated peritonitis - growing Pseudomonas and E. Coli, antibiotics change to zosyn  3. S/p PD catheter removal on 05/27/19  4. Anemia due to CKD and iron deficiency      Recommendations:     1. For HD tomorrow, then TTS; upon discharge, plan to continue HD at [facility]   2. Follow intraop cultures; s/p PD catheter removal, + Pseudomonas -- previous PD fluid culture grew E. Coli   3. Continue curretn BP Rx  4. Check phos with AM labs; currently scheduled for sevelamer 800mg  tid with meals   5. Aranesp; received IV iron at the end of April      Delfina Redwood, MD, thank you for this consultation.  We will follow the patient with you during this hospitalization.  Please contact me with any questions or issues.    Signed by: Verl Blalock, MD  [facility]  [phone] (office)  x [phone] ([place])      -----------------------------------------------------------------------------------------------------------        History of Presenting Illness:   [name] is a 64 y.o. female with ESRD maintained on PD until  5/3 when she was switched to HD, ongoing PD associated peritonitis now growing Pseudomonas and E.Coli, HTN, anemia (CKD and iron deficiency), IDDM who presented to the hospital after being discharged the same day for ongoing abdominal pain. Nephrology consulted for provision of hemodialysis.     [name] was recently diagnosed with PD associated E.Coli peritonitis for which she was started on cefazolin. Prior to being discharged on 5/3, she was switched to HD, received HD that day. Came back later in the evening/early morning of 5/4 due to worsening abdominal pain. Taken to the OR for PD catheter removal;she is now growing Pseudomonas for which abx were changed to zosyn. Was dialyzed on 5/6, tolerated treatment well.     Past Medical History:     Past Medical History:   Diagnosis Date    Chronic obstructive pulmonary disease     Diabetes mellitus     Hyperlipidemia     Hypertension     Sleep apnea 2018           Past Surgical History:     Past Surgical History:   Procedure Laterality Date    EGD, BIOPSY N/A 05/14/2019    Procedure: EGD, BIOPSY;  Surgeon: Ronie Spies, MD;  Location: [facility];  Service: Gastroenterology;  Laterality: N/A;  EXPLORATORY LAPAROTOMY N/A 05/27/2019    Procedure: EXPLORATORY LAPAROTOMY;  Surgeon: Herbert Moors., MD;  Location: [facility];  Service: General;  Laterality: N/A;    JOINT REPLACEMENT      shoulder    JOINT REPLACEMENT      knee    OVARY SURGERY Left     Removal    REMOVAL, FOREIGN BODY N/A 05/27/2019    Procedure: REMOVAL, PERITONEAL DIALYSIS CATHETER;  Surgeon: Herbert Moors., MD;  Location: [facility];  Service: General;  Laterality: N/A;    TUNNELED CATH PLACEMENT ([place]) Bilateral 05/23/2019    Procedure: TUNNELED CATH PLACEMENT;  Surgeon: Jacques Earthly, MD;  Location: [facility];  Service: Interventional Radiology;  Laterality: Bilateral;       Family History:     Family History   Problem Relation Age of Onset     Diabetes Mother     Hypertension Mother     Diabetes Father     Hypertension Father     Diabetes Sister     Hypertension Sister     Diabetes Brother     Hypertension Brother     Diabetes Paternal Aunt     Diabetes Paternal Uncle        Social History:     Social History     Socioeconomic History    Marital status: Legally Separated     Spouse name: Not on file    Number of children: Not on file    Years of education: Not on file    Highest education level: Not on file   Occupational History    Not on file   Tobacco Use    Smoking status: Never Smoker    Smokeless tobacco: Never Used   Substance and Sexual Activity    Alcohol use: Never    Drug use: Never    Sexual activity: Not Currently   Other Topics Concern    Not on file   Social History Narrative    Not on file     Social Determinants of Health     Financial Resource Strain:     Difficulty of Paying Living Expenses:    Food Insecurity:     Worried About Charity fundraiser in the Last Year:     Arboriculturist in the Last Year:    Transportation Needs:     Film/video editor (Medical):     Lack of Transportation (Non-Medical):    Physical Activity:     Days of Exercise per Week:     Minutes of Exercise per Session:    Stress:     Feeling of Stress :    Social Connections:     Frequency of Communication with Friends and Family:     Frequency of Social Gatherings with Friends and Family:     Attends Religious Services:     Active Member of Clubs or Organizations:     Attends Archivist Meetings:     Marital Status:    Intimate Partner Violence:     Fear of Current or Ex-Partner:     Emotionally Abused:     Physically Abused:     Sexually Abused:        Allergies:     Allergies   Allergen Reactions    Lisinopril Hives    Metformin Hives       Medications:     Medications Prior to Admission   Medication Sig  amLODIPine (NORVASC) 5 MG tablet Take 1 tablet (5 mg total) by mouth every 12 (twelve) hours     atorvastatin (LIPITOR) 40 MG tablet Take 40 mg by mouth daily    carvedilol (COREG) 25 MG tablet Take 25 mg by mouth    famotidine (PEPCID) 40 MG tablet Take 40 mg by mouth daily    furosemide (LASIX) 40 MG tablet Take 40 mg by mouth 2 (two) times daily    gabapentin (NEURONTIN) 600 MG tablet Take 600 mg by mouth 3 (three) times daily    insulin aspart (NovoLOG) 100 UNIT/ML injection Inject into the skin 3 (three) times daily before meals    insulin glargine (LANTUS) 100 UNIT/ML injection Inject 10 Units into the skin every morning    oxyCODONE-acetaminophen (PERCOCET) 5-325 MG per tablet Take 1 tablet by mouth every 4 (four) hours as needed for Pain    pantoprazole (PROTONIX) 40 MG tablet Take 1 tablet (40 mg total) by mouth 2 (two) times daily    sevelamer (RENVELA) 800 MG tablet Take 800 mg by mouth 3 (three) times daily with meals        Scheduled Meds: PRN Meds:    amLODIPine, 5 mg, Oral, Q12H  atorvastatin, 40 mg, Oral, Daily  carvedilol, 25 mg, Oral, Q12H SCH  darbepoetin alfa, 60 mcg, Subcutaneous, Weekly  docusate sodium, 100 mg, Oral, Daily  heparin (porcine), 5,000 Units, Subcutaneous, Q12H SCH  insulin glargine, 10 Units, Subcutaneous, QAM  insulin lispro, 1-8 Units, Subcutaneous, TID AC  lactobacillus/streptococcus, 1 capsule, Oral, Daily  pantoprazole, 40 mg, Oral, QAM AC  piperacillin-tazobactam, 2.25 g, Intravenous, Q12H  sevelamer, 800 mg, Oral, TID MEALS  vancomycin, 125 mg, Oral, Q6H          Continuous Infusions:   sodium chloride 20 mL/hr at 05/27/19 1100    sodium chloride      lactated ringers      sodium chloride, , PRN  acetaminophen, 650 mg, Q6H PRN  bisacodyl, 10 mg, Daily PRN  dextrose, 15 g of glucose, PRN   And  dextrose, 12.5 g, PRN   And  glucagon (rDNA), 1 mg, PRN  diphenhydrAMINE, 25 mg, Q6H PRN  morphine, 2 mg, Q4H PRN  naloxone, 0.2 mg, PRN  ondansetron, 4 mg, Once PRN  ondansetron, 4 mg, Q6H PRN   Or  ondansetron, 4 mg, Q6H PRN  oxyCODONE, 5 mg, Q6H PRN    Or  oxyCODONE, 10 mg, Q6H PRN  petrolatum, , PRN            Review of Systems:   A comprehensive review of systems was per the HPI and below:     General ROS: no f/c  HEENT ROS: no blurry vision  Hematological and Lymphatic ROS: no known bleeding/clotting disorders  Respiratory ROS: negative for cough, shortness of breath, or wheezing  Cardiovascular ROS: negative for chest pain or dyspnea on exertion  Gastrointestinal ROS: abdominal pain  Genito-Urinary ROS: negative for dysuria, hematuria, difficulty voiding or nocturia  Musculoskeletal ROS:  negative for trauma  Neurological ROS: no focal weakness, no dizziness  Dermatological ROS: no new rashes or lesions      Physical Exam:     Vitals:    05/30/19 0300 05/30/19 0555 05/30/19 0826 05/30/19 0938   BP: 108/67  110/68 106/70   Pulse: 74  72    Resp: 17  17    Temp: 98.1 F (36.7 C)  98.6 F (37 C)    TempSrc: Oral  Oral  SpO2: 98% 95% 99%    Weight: 106.9 kg (235 lb 10.8 oz)      Height:           Intake and Output Summary (Last 24 hours) at Date Time    Intake/Output Summary (Last 24 hours) at 05/30/2019 1015  Last data filed at 05/30/2019 0300  Gross per 24 hour   Intake 360 ml   Output 2000 ml   Net -1640 ml       Recent weights:  Weight Monitoring 05/24/2019 05/25/2019 05/26/2019 05/27/2019 05/28/2019 05/29/2019 05/30/2019   Height - - - 157.5 cm - - -   Height Method - - - Stated - - -   Weight 120.1 kg 118 kg 120.7 kg 113.399 kg 113.399 kg 113.399 kg 106.9 kg   Weight Method Bed Scale Bed Scale Bed Scale Stated Bed Scale Bed Scale Bed Scale   BMI (calculated) - - - 45.8 kg/m2 - - -       General: awake, alert, oriented x 3, no acute distress.  HEENT: sclera anicteric  Neck: supple  Cardiovascular: regular rate and rhythm  Lungs: clear to auscultation bilaterally, bilateral air entry, normal work of breathing  Abdomen: soft, very tender to light palpation  Extremities: trace edema  Neuro: A+O x 3, no gross motor/sensory deficits    AccessL R HDTC      Labs:     Recent Labs    Lab 05/30/19  0311 05/29/19  1302 05/28/19  0437   WBC 7.38 2.12* 12.77*   Hgb 7.8* 7.6* 9.9*   Hematocrit 27.2* 26.1* 33.1*   Platelets 125* 116* 149     Recent Labs   Lab 05/30/19  0311 05/29/19  1302 05/28/19  0437   Sodium 139 136 137   Potassium 3.7 4.0 4.7   Chloride 101 102 105   CO2 26 26 20*   BUN 20.0* 29.0* 24.0*   Creatinine 4.3* 5.0* 4.1*   Calcium 7.0* 6.9* 7.1*   Albumin 1.4* 1.4* 1.6*   Glucose 112* 146* 194*   EGFR 12.5 10.5 13.2           cc: Delfina Redwood, MD  Pcp, None, MD

## 2019-05-30 NOTE — Progress Notes (Signed)
Updates as follows:  CM made referral and spoke with Dialysis Liaison Agustin Cree @[phone] who was able to secure a Chair with [facility] on MWF @7 :30 A.M.  After communicating with pt, pt reported she he still not able to walk and does not have a secured means for transport to Dialysis on Monday.  SW made SNF  Referral and also provided an update to the attending.  Disposition at this point remains Home vs. SNF.  Pt lives alone and does not have supports in the home.  CM is awaiting responses from area SNF'S and will continue to follow.

## 2019-05-30 NOTE — Progress Notes (Addendum)
[facility]  PROGRESS NOTE      Patient: [name]  Date: 05/30/2019   LOS: 1 Days  Admission Date: 05/27/2019   MRN: [id number]  Attending: Delfina Redwood  Please contact me on Epic Chat      ASSESSMENT/PLAN     [name] is a 64 y.o. female admitted with Generalized abdominal pain    Interval Summary:   Patient was admitted for abdominal pain.  Being managed for PD peritonitis status post PD catheter removal by surgery through exploratory laparotomy.  Patient continues to have abdominal pain.  Diet was slowly advanced..  Peritoneal fluid cultures from 5 4 growing Pseudomonas.  ID following.  Antibiotics was switched from cefazolin to Zosyn to cover for Pseudomonas on 5 6.  Follow-up with ID on antibiotics recommendation.  Patient was also started on hemodialysis since PD dialysis has been stopped.  Patient would need case manager to arrange for outpatient dialysis.  Case manager working on outpatient dialysis set up.    Patient Active Hospital Problem List:    Abdominal pain   Diarrhea on admission which has since resolved. Was started on empiric treatment of c diff with po Vancomycin. Diarrhea resolved and stool sample was not obtained to r/o c diff.  -Likely due to PD peritonitis s/p PD removal   - Pseudomonas in peritoneal fluid cx from 5/4. E coli in cx from 4/20 on prior admission  -Patient has been using IV antibiotic and was discharged on IV cefazolin.   - Abx changed to Zosyn on 5/6 to cover pseudomonas.  Follow-up on susceptibility  - continue Po vanc per ID  - probiotics  -Lipase is normal, lactic acid normal  -Appreciate ID recs.   -Percocet for moderate pain  -Cultures pending, follow up     ESRD on peritoneal dialysis  -Started on dialysis on 05/2018  -Continue HD     Hypertension  -Continue home amlodipine, coreg    Hyperglycemia  Type 2 DM  -Continue home lantus, SSI, ac/hs accuchecks    Hyperlipidemia   -Continue home statin    Nutrition: clears   DVT Prophylaxis:  Heparin      Code Status: Full  DISPO: TBD     SUBJECTIVE     [name] seen and examined at bedside   Denies cp, sob, no fever  Continues to report abdominal pain around op site  Denies nausea/vomiting     MEDICATIONS     Current Facility-Administered Medications   Medication Dose Route Frequency    amLODIPine  5 mg Oral Q12H    atorvastatin  40 mg Oral Daily    carvedilol  25 mg Oral Q12H SCH    darbepoetin alfa  60 mcg Subcutaneous Weekly    docusate sodium  100 mg Oral Daily    heparin (porcine)  5,000 Units Subcutaneous Q12H [place]    insulin glargine  10 Units Subcutaneous QAM    insulin lispro  1-8 Units Subcutaneous TID AC    lactobacillus/streptococcus  1 capsule Oral Daily    pantoprazole  40 mg Oral QAM AC    piperacillin-tazobactam  2.25 g Intravenous Q12H    potassium chloride  40 mEq Oral Once    sevelamer  800 mg Oral TID MEALS    vancomycin  125 mg Oral Q6H       PHYSICAL EXAM     Vitals:    05/30/19 0826   BP: 110/68   Pulse: 72   Resp:  17   Temp: 98.6 F (37 C)   SpO2: 99%       Temperature: Temp  Min: 97.9 F (36.6 C)  Max: 98.6 F (37 C)  Pulse: Pulse  Min: 71  Max: 83  Respiratory: Resp  Min: 16  Max: 20  Non-Invasive BP: BP  Min: 92/37  Max: 164/66  Pulse Oximetry SpO2  Min: 95 %  Max: 99 %    Intake and Output Summary (Last 24 hours) at Date Time    Intake/Output Summary (Last 24 hours) at 05/30/2019 0907  Last data filed at 05/30/2019 0300  Gross per 24 hour   Intake 360 ml   Output 2000 ml   Net -1640 ml       GEN APPEARANCE: Normal;  A&OX3  HEENT: PERLA; EOMI; Conjunctiva Clear  NECK: Supple; No bruits  CVS: RRR, S1, S2; No M/G/R  LUNGS: CTAB; No Wheezes; No Rhonchi: No rales  ABD: Soft; No TTP; + Normoactive BS  EXT: No edema; Pulses 2+ and intact  Skin exam:  pink  NEURO: non focal, moves all extremities   CAP REFILL:  Normal  MENTAL STATUS:  Normal        LABS     Recent Labs   Lab 05/30/19  0311 05/29/19  1302 05/28/19  0437   WBC 7.38 2.12* 12.77*   RBC 2.76* 2.67*  3.37*   Hgb 7.8* 7.6* 9.9*   Hematocrit 27.2* 26.1* 33.1*   MCV 98.6* 97.8* 98.2*   Platelets 125* 116* 149       Recent Labs   Lab 05/30/19  0311 05/29/19  1302 05/28/19  0437 05/27/19  0507 05/26/19  0315   Sodium 139 136 137 139 141   Potassium 3.7 4.0 4.7 4.1 3.8   Chloride 101 102 105 103 105   CO2 26 26 20* 28 27   BUN 20.0* 29.0* 24.0* 13.0 23.0*   Creatinine 4.3* 5.0* 4.1* 3.0* 4.0*   Glucose 112* 146* 194* 155* 127*   Calcium 7.0* 6.9* 7.1* 7.8* 7.5*       Recent Labs   Lab 05/30/19  0311 05/29/19  1302 05/28/19  0437   ALT <6 <6 <6   AST (SGOT) 10 10 7    Bilirubin, Total 0.3 0.2 0.4   Albumin 1.4* 1.4* 1.6*   Alkaline Phosphatase 99 89 95                   Microbiology Results     Procedure Component Value Units Date/Time    Anaerobic culture [[id number]] Collected: 05/27/19 2343    Specimen: Other from Wound Updated: 05/28/19 0756    Anaerobic culture [[id number]] Collected: 05/27/19 2343    Specimen: Other from Fluid Updated: 05/28/19 0756    Blood Culture Aerobic/Anaerobic #1 [[id number]] Collected: 05/27/19 0507    Specimen: Arm from Blood, Venipuncture Updated: 05/28/19 1021    Narrative:      ORDER#: [id number]                                    ORDERED BY: CHUNG, SORA  SOURCE: Blood, Venipuncture arm                      COLLECTED:  05/27/19 05:07  ANTIBIOTICS AT COLL.:  RECEIVED :  05/27/19 09:50  Culture Blood Aerobic and Anaerobic        PRELIM      05/28/19 10:21  05/28/19   No Growth after 1 day/s of incubation.      Blood Culture Aerobic/Anaerobic #2 [[id number]] Collected: 05/27/19 0507    Specimen: Arm from Blood, Venipuncture Updated: 05/28/19 1021    Narrative:      ORDER#: [id number]                                    ORDERED BY: CHUNG, SORA  SOURCE: Blood, Venipuncture arm                      COLLECTED:  05/27/19 05:07  ANTIBIOTICS AT COLL.:                                RECEIVED :  05/27/19 09:50  Culture Blood Aerobic and Anaerobic        PRELIM      05/28/19  10:21  05/28/19   No Growth after 1 day/s of incubation.      COVID-19 (SARS-COV-2) [facility] [employer]) [[id number]] Collected: 05/27/19 0624    Specimen: Nasopharyngeal Swab from Nasopharynx Updated: 05/27/19 0656     Purpose of COVID testing Screening     SARS-CoV-2 Specimen Source Nasopharyngeal     SARS CoV 2 Overall Result Negative     Comment: Test performed using the Abbott ID NOW EUA assay.  Please see Fact Sheets for patients and providers located at:  http://[facility]-hall.info/  This test is for the qualitative detection of SARS-CoV-2  (COVID19) nucleic acid. Viral nucleic acids may persist in vivo,  independent of viability. Detection of viral nucleic acid does  not imply the presence of infectious virus, or that virus  nucleic acid is the cause of clinical symptoms. Negative  results should be treated as presumptive and, if inconsistent  with clinical signs and symptoms or necessary for patient  management, should be tested with an alternative molecular  assay. Negative results do not preclude SARS-CoV-2 infection  and should not be used as the sole basis for patient  management decisions. Invalid results may be due to inhibiting  substances in the specimen and recollection should occur.         Narrative:      o Collect and clearly label specimen type:  o Upper respiratory specimen: One Nasopharyngeal Dry Swab NO  Transport Media.  o Hand deliver to laboratory ASAP  Indication for testing->Extended care facility admission to  semi private room    Culture + Gram Lenise Arena, Body Fluid [[id number]] Collected: 05/27/19 0534    Specimen: Body Fluid from Paracentesis Fluid Updated: 05/28/19 0719    Narrative:      [id number] called Micro Results of Gram Stain. Results read back by: [id number], by  [id number] on 05/27/2019 at 14:31  < 15ml  ORDER#: [id number]                                    ORDERED BY: CHUNG, SORA  SOURCE: Paracentesis Fluid peritoneal dialysis cathetCOLLECTED:  05/27/19 05:34  ANTIBIOTICS AT  COLL.:  RECEIVED :  05/27/19 10:09  ORDER ENTRY COMMENTS:  < 64ml  [id number] called Micro Results of Gram Stain. Results read back by: [id number], by [id number] on 05/27/2019 at 14:31  Stain, Gram                                FINAL       05/27/19 14:31  05/27/19   No Squamous epithelial cells seen             No WBCs seen             Rare Gram positive cocci             Stain performed on Cytospin (concentrated) specimen  Culture and Gram Stain, Aerobic, Body FluidPRELIM      05/28/19 07:19  05/28/19   Culture no growth to date. Final report to follow      Fungus culture [[id number]] Collected: 05/27/19 2343    Specimen: Other from Tissue Updated: 05/28/19 0756    Fungus culture [[id number]] Collected: 05/27/19 0259    Specimen: Other from Fluid Updated: 05/28/19 0756    Wound culture & gram stain [[id number]] Collected: 05/27/19 2343    Specimen: Wound Updated: 05/28/19 1306    Narrative:      blue swab  ORDER#: [id number]                                    ORDERED BY: Baird Cancer, ENOCH  SOURCE: Wound peritoneal fluid                       COLLECTED:  05/27/19 23:43  ANTIBIOTICS AT COLL.:                                RECEIVED :  05/28/19 07:56  ORDER ENTRY COMMENTS:  blue swab  Stain, Gram                                FINAL       05/28/19 13:06  05/28/19   Few WBCs             Rare Squamous epithelial cells             No organisms seen  Culture and Gram Stain, Aerobic, Wound     PENDING      Wound culture & gram stain [[id number]] Collected: 05/27/19 0221    Specimen: Wound Updated: 05/28/19 1303    Narrative:      ORDER#: [id number]                                    ORDERED BY: Kalman Jewels  SOURCE: Wound abdominal wound                        COLLECTED:  05/27/19 02:21  ANTIBIOTICS AT COLL.:                                RECEIVED :  05/28/19 07:56  Stain, Gram  FINAL       05/28/19 13:03  05/28/19   Moderate WBCs             Few Squamous epithelial cells              No organisms seen  Culture and Gram Stain, Aerobic, Wound     PENDING             RADIOLOGY     CT Abd/Pelvis with IV Contrast only    Result Date: 05/27/2019  1. Fluid-filled mildly distended loops of small bowel with distal decompressed small bowel loops. Findings may represent early or partial small bowel obstruction. Enteritis is felt to be less likely. 2. Trace bilateral pleural effusions. 3. Additional chronic findings as above. Delaney Meigs, MD  05/27/2019 6:07 AM    Tunneled Cath Placement (Permcath)    Result Date: 05/23/2019   Technically successful placement of an  23cm Equistream catheter via the right internal jugular vein as described above. Phlebitic changes in the right internal jugular vein from prior catheter placement. Dimitrios Papadouris, MD  05/23/2019 3:51 PM        Signed,  Delfina Redwood  9:07 AM 05/30/2019

## 2019-05-30 NOTE — Progress Notes (Signed)
INFECTIOUS DISEASES PROGRESS NOTE    Date Time: 05/30/19 6:23 PM  Patient Name: [name],[name]  Patient status.Inpatient  Hospital Day: 1    Assessment and Plan:   1.  PD catheter peritonitis/E. Coli  2.  Old PD catheter removed./Pseudomonas in intraoperative culture        Plan: 1. Zosyn to cover both Pseudomonas and E. coli previously cultured  2.  Continue p.o. vancomycin  3.  Change to p.o. Cipro once ready for transfer to rehab  Subjective:   Nontoxic    Review of Systems:   Review of Systems -abdominal pain     Antibiotics:   Day 3    Other medications reviewed in EPIC.  Central Access:   New dialysis access day 4    Physical Exam:     Vitals:    05/30/19 1737   BP: 98/60   Pulse: 72   Resp: 17   Temp: 97.9 F (36.6 C)   SpO2: 94%       Lungs: Clear  Heart: Regular rhythm  Abdomen: Bowel sounds heard.  Wound healed.  Decreasing tenderness to palpation    Labs:     Microbiology Results     Procedure Component Value Units Date/Time    Blood Culture Aerobic/Anaerobic #1 [[id number]] Collected: 05/27/19 0507    Specimen: Arm from Blood, Venipuncture Updated: 05/27/19 0950    Narrative:      1 BLUE+1 PURPLE    Blood Culture Aerobic/Anaerobic #2 [[id number]] Collected: 05/27/19 0507    Specimen: Arm from Blood, Venipuncture Updated: 05/27/19 0950    Narrative:      1 BLUE+1 PURPLE    COVID-19 (SARS-COV-2) [facility]) [[id number]] Collected: 05/27/19 0624    Specimen: Nasopharyngeal Swab from Nasopharynx Updated: 05/27/19 0656     Purpose of COVID testing Screening     SARS-CoV-2 Specimen Source Nasopharyngeal     SARS CoV 2 Overall Result Negative     Comment: Test performed using the Abbott ID NOW EUA assay.  Please see Fact Sheets for patients and providers located at:  http://[facility].info/  This test is for the qualitative detection of SARS-CoV-2  (COVID19) nucleic acid. Viral nucleic acids may persist in vivo,  independent of viability. Detection of viral nucleic acid does  not imply the  presence of infectious virus, or that virus  nucleic acid is the cause of clinical symptoms. Negative  results should be treated as presumptive and, if inconsistent  with clinical signs and symptoms or necessary for patient  management, should be tested with an alternative molecular  assay. Negative results do not preclude SARS-CoV-2 infection  and should not be used as the sole basis for patient  management decisions. Invalid results may be due to inhibiting  substances in the specimen and recollection should occur.         Narrative:      o Collect and clearly label specimen type:  o Upper respiratory specimen: One Nasopharyngeal Dry Swab NO  Transport Media.  o Hand deliver to laboratory ASAP  Indication for testing->Extended care facility admission to  semi private room    Culture + Gram Lenise Arena, Body Fluid [[id number]] Collected: 05/27/19 0534    Specimen: Body Fluid from Paracentesis Fluid Updated: 05/27/19 1009          CBC w/Diff CMP   Recent Labs   Lab 05/30/19  0311 05/29/19  1302 05/28/19  0437 05/27/19  0507 05/27/19  0507   WBC 7.38 2.12* 12.77*  More results  in Results Review 15.22*   Hgb 7.8* 7.6* 9.9*  More results in Results Review 10.4*   Hematocrit 27.2* 26.1* 33.1*  More results in Results Review 34.1*   Platelets 125* 116* 149  More results in Results Review 165   MCV 98.6* 97.8* 98.2*  More results in Results Review 96.3*   Neutrophils  --   --   --   --  86.8   More results in Results Review = values in this interval not displayed.       PT/INR         Recent Labs   Lab 05/30/19  0311 05/29/19  1302 05/28/19  0437   Sodium 139 136 137   Potassium 3.7 4.0 4.7   Chloride 101 102 105   CO2 26 26 20*   BUN 20.0* 29.0* 24.0*   Creatinine 4.3* 5.0* 4.1*   Glucose 112* 146* 194*   Calcium 7.0* 6.9* 7.1*   Protein, Total 4.7* 4.2* 4.4*   Albumin 1.4* 1.4* 1.6*   AST (SGOT) 10 10 7    ALT <6 <6 <6   Alkaline Phosphatase 99 89 95   Bilirubin, Total 0.3 0.2 0.4      Glucose POCT   Recent Labs   Lab  05/30/19  0311 05/29/19  1302 05/28/19  0437 05/27/19  0507 05/26/19  0315 05/25/19  0418 05/24/19  0337   Glucose 112* 146* 194* 155* 127* 113* 253*          Rads:     Radiology Results (24 Hour)     ** No results found for the last 24 hours. **            Signed by: Ernst Breach

## 2019-05-30 NOTE — UM Notes (Signed)
CSR 05/30/19      5/7 SURGERY PROG NOTE  ASSESSMENT:   3 Days Post-Op S/P Procedure(s):  REMOVAL, PERITONEAL DIALYSIS CATHETER  EXPLORATORY LAPAROTOMY    Doing ok  Still with some pain, woke her and she immediately stated she was in pain  PLAN:   colace  Removed dressing  Recommend pt-- does not appear to be ambulating  dispo planning  Follow up in 2 weeks in office    SUBJECTIVE:   The patient is doing fairly well.  Post operative pain is not well controlled with medications.  Current dietary status:  Glucerna Supplement Quantity: A. One; Flavor: Strawberry; Frequency: Daily with lunch  Diet clear liquid and tolerating fairly well.  Flatus: yes. BM:  no.  Additional complaints: none    Culture and Gram Stain, Aerobic, Wound     FINAL       05/30/19 08:29   +  05/29/19   Light growth of Pseudomonas aeruginosa      Comprehensive metabolic panel [[id number]]  (Abnormal) Collected: 05/30/19 0311     Specimen: Blood Updated: 05/30/19 0507     Glucose 112 mg/dL      BUN 20.0 mg/dL      Creatinine 4.3 mg/dL      Sodium 139 mEq/L      Potassium 3.7 mEq/L      Chloride 101 mEq/L      CO2 26 mEq/L      Calcium 7.0 mg/dL      Protein, Total 4.7 g/dL      Albumin 1.4 g/dL      AST (SGOT) 10 U/L      ALT <6 U/L      Alkaline Phosphatase 99 U/L      Bilirubin, Total 0.3 mg/dL      Globulin 3.3 g/dL      Albumin/Globulin Ratio 0.4     Hemolysis Index -1     CBC without differential [[id number]]  (Abnormal) Collected: 05/30/19 0311     Specimen: Blood Updated: 05/30/19 0441     WBC 7.38 x10 3/uL      Hgb 7.8 g/dL      Hematocrit 27.2 %      Platelets 125 x10 3/uL      RBC 2.76 x10 6/uL      MCV 98.6 fL      MCH 28.3 pg      MCHC 28.7 g/dL      Mental status - alert, oriented to person, place, and time, normal mood, behavior, speech, dress, motor activity, and thought processes  Chest - clear to auscultation, no wheezes, rales or rhonchi, symmetric air entry, no  tachypnea, retractions or cyanosis  Heart - normal rate, regular rhythm, normal S1, S2, no murmurs, rubs, clicks or gallops  Abdomen - tenderness noted at incision, dressing removed looks good, staples intact  Wound - clean, dry  Extremities - peripheral pulses normal, no pedal edema, no clubbing or cyanosis      ID PROG NOTE:  Assessment and Plan:   1.  PD catheter peritonitis/E. Coli  2.  Old PD catheter removed./Pseudomonas and intraoperative culture        Plan: 1.  Change antibiotic to Zosyn to cover both Pseudomonas and E. coli previously cultured  2.  Continue p.o. vancomycin    Subjective:   Nontoxic        NEPHROLOGY CONSULT:  Reason for Consultation: ESRD, provision of hemodialysis      Assessment:  Patient Active Problem List   Diagnosis    Iron deficiency anemia secondary to inadequate dietary iron intake    Sideropenic dysphagia    Peritoneal dialysis catheter dysfunction, initial encounter    Peritonitis    Upper GI bleed    ESRD (end stage renal disease) on dialysis    Generalized abdominal pain     1. ESRD, on PD until 5/3 when she was switched to HD   - HD on 5/3   - plan for d/c and next HD at [place] on 5/6, however patient remains in-house, so dialyzed at [facility] on 5/6  2. Abdominal pain due to PD associated peritonitis - growing Pseudomonas and E. Coli, antibiotics change to zosyn  3. S/p PD catheter removal on 05/27/19  4. Anemia due to CKD and iron deficiency      Recommendations:     1. For HD tomorrow, then TTS; upon discharge, plan to continue HD at [facility]   2. Follow intraop cultures; s/p PD catheter removal, + Pseudomonas -- previous PD fluid culture grew E. Coli   3. Continue curretn BP Rx  4. Check phos with AM labs; currently scheduled for sevelamer 800mg  tid with meals   5. Aranesp; received IV iron at the end of April    CLEAR LIQ DIET    05/30/19 03:00:59  98.1 F (36.7 C)    74  17  108/67  98 %  3 L/min     piperacillin-tazobactam (ZOSYN) 2.25 g in  sodium chloride 0.9 % 100 mL mini-bag plus   Dose: 2.25 g  Freq: Every 12 hours Route: IV    vancomycin oral 50 mg/mL solution 125 mg   Dose: 125 mg  Freq: Every 6 hours Route: PO    diphenhydrAMINE (BENADRYL) capsule 25 mg   Dose: 25 mg  Freq: Every 6 hours PRN Route: PO-X 1 TODAY    oxyCODONE (ROXICODONE) immediate release tablet 10 mg   Dose: 10 mg  Freq: Every 6 hours PRN Route: PO  PRN Reason: severe pain- X 2 DOSES SO FAR TODAY

## 2019-05-30 NOTE — Progress Notes (Addendum)
POST OPERATIVE PROGRESS NOTE  [facility]    Date Time: 05/30/19 12:02 PM  Patient Name: [name],[name]      ASSESSMENT:   3 Days Post-Op S/P Procedure(s):  REMOVAL, PERITONEAL DIALYSIS CATHETER  EXPLORATORY LAPAROTOMY    Doing ok  Still with some pain, woke her and she immediately stated she was in pain  PLAN:   Diet as tolerated  colace  Removed dressing  Recommend pt-- does not appear to be ambulating  dispo planning  Follow up in 2 weeks in office        SUBJECTIVE:   The patient is doing fairly well.  Post operative pain is not well controlled with medications.  Current dietary status:  Glucerna Supplement Quantity: A. One; Flavor: Strawberry; Frequency: Daily with lunch  Diet clear liquid and tolerating fairly well.  Flatus: yes. BM:  no.  Additional complaints: none       OBJECTIVE:   Current Vitals:   Vitals:    05/30/19 0938   BP: 106/70   Pulse:    Resp:    Temp:    SpO2:        Intake and Output Summary (Last 24 hours):  I/O last 3 completed shifts:  In: 51 [P.O.:500; I.V.:30]  Out: 2200 [Urine:200; Other:2000]    Labs:     Results     Procedure Component Value Units Date/Time    Blood Culture Aerobic/Anaerobic #1 [[id number]] Collected: 05/27/19 0507    Specimen: Arm from Blood, Venipuncture Updated: 05/30/19 1021    Narrative:      ORDER#: [id number]                                    ORDERED BY: CHUNG, SORA  SOURCE: Blood, Venipuncture arm                      COLLECTED:  05/27/19 05:07  ANTIBIOTICS AT COLL.:                                RECEIVED :  05/27/19 09:50  Culture Blood Aerobic and Anaerobic        PRELIM      05/30/19 10:21  05/28/19   No Growth after 1 day/s of incubation.  05/29/19   No Growth after 2 day/s of incubation.  05/30/19   No Growth after 3 day/s of incubation.      Blood Culture Aerobic/Anaerobic #2 [[id number]] Collected: 05/27/19 0507    Specimen: Arm from Blood, Venipuncture Updated: 05/30/19 1021    Narrative:      ORDER#: [id number]                                     ORDERED BY: CHUNG, SORA  SOURCE: Blood, Venipuncture arm                      COLLECTED:  05/27/19 05:07  ANTIBIOTICS AT COLL.:                                RECEIVED :  05/27/19 09:50  Culture Blood Aerobic and Anaerobic        PRELIM  05/30/19 10:21  05/28/19   No Growth after 1 day/s of incubation.  05/29/19   No Growth after 2 day/s of incubation.  05/30/19   No Growth after 3 day/s of incubation.      Glucose Whole Blood - POCT [[id number]]  (Abnormal) Collected: 05/30/19 0823     Updated: 05/30/19 0829     Whole Blood Glucose POCT 103 mg/dL     Wound culture & gram stain [[id number]] Collected: 05/27/19 2343    Specimen: Wound Updated: 05/30/19 0829    Narrative:      blue swab  ORDER#: [id number]                                    ORDERED BY: Baird Cancer, ENOCH  SOURCE: Wound peritoneal fluid                       COLLECTED:  05/27/19 23:43  ANTIBIOTICS AT COLL.:                                RECEIVED :  05/28/19 07:56  ORDER ENTRY COMMENTS:  blue swab  Stain, Gram                                FINAL       05/28/19 13:06  05/28/19   Few WBCs             Rare Squamous epithelial cells             No organisms seen  Culture and Gram Stain, Aerobic, Wound     FINAL       05/30/19 08:29   +  05/29/19   Light growth of Pseudomonas aeruginosa               Refer to susceptibilities on culture #[id number]        Wound culture & gram stain [[id number]] Collected: 05/27/19 0221    Specimen: Wound Updated: 05/30/19 0828    Narrative:      ORDER#: [id number]                                    ORDERED BY: Kalman Jewels  SOURCE: Wound abdominal wound                        COLLECTED:  05/27/19 02:21  ANTIBIOTICS AT COLL.:                                RECEIVED :  05/28/19 07:56  Stain, Gram                                FINAL       05/28/19 13:03  05/28/19   Moderate WBCs             Few Squamous epithelial cells             No organisms seen  Culture and Gram Stain, Aerobic, Wound     FINAL       05/30/19  08:28   +  05/30/19   Light growth of Pseudomonas aeruginosa      _____________________________________________________________________________                                  P.aeruginosa    ANTIBIOTICS                     MIC  INTRP      _____________________________________________________________________________  Amikacin                        <=8    S        Aztreonam                        8     S        Cefepime                         2     S        Ceftazidime                     <=2    S        Ciprofloxacin                 <=0.25   S        Gentamicin                      <=2    S        Levofloxacin                   <=0.5   S        Meropenem                      <=0.5   S        Piperacillin/Tazobactam         4/4    S        Tobramycin                      <=2    S        _____________________________________________________________________________            S=SUSCEPTIBLE     I=INTERMEDIATE     R=RESISTANT                            N/S=NON-SUSCEPTIBLE  _____________________________________________________________________________      Comprehensive metabolic panel [[id number]]  (Abnormal) Collected: 05/30/19 0311    Specimen: Blood Updated: 05/30/19 0507     Glucose 112 mg/dL      BUN 20.0 mg/dL      Creatinine 4.3 mg/dL      Sodium 139 mEq/Young      Potassium 3.7 mEq/Young      Chloride 101 mEq/Young      CO2 26 mEq/Young      Calcium 7.0 mg/dL      Protein, Total 4.7 g/dL      Albumin 1.4 g/dL      AST (SGOT) 10 U/Young      ALT <6 U/Young      Alkaline Phosphatase 99 U/Young      Bilirubin, Total 0.3 mg/dL  Globulin 3.3 g/dL      Albumin/Globulin Ratio 0.4     Anion Gap 12.0    Hemolysis index [[id number]]  (Abnormal) Collected: 05/30/19 0311     Updated: 05/30/19 0507     Hemolysis Index -1    GFR [[id number]] Collected: 05/30/19 0311     Updated: 05/30/19 0507     EGFR 12.5    CBC without differential [[id number]]  (Abnormal) Collected: 05/30/19 0311    Specimen: Blood Updated: 05/30/19 0441     WBC 7.38 x10 3/uL       Hgb 7.8 g/dL      Hematocrit 27.2 %      Platelets 125 x10 3/uL      RBC 2.76 x10 6/uL      MCV 98.6 fL      MCH 28.3 pg      MCHC 28.7 g/dL      RDW 13 %      MPV 12.5 fL      Nucleated RBC 0.0 /100 WBC      Absolute NRBC 0.00 x10 3/uL     Glucose Whole Blood - POCT [[id number]]  (Abnormal) Collected: 05/29/19 2120     Updated: 05/29/19 2129     Whole Blood Glucose POCT 168 mg/dL           Rads:     Radiology Results (24 Hour)     ** No results found for the last 24 hours. **          Physical Exam:     Mental status - alert, oriented to person, place, and time, normal mood, behavior, speech, dress, motor activity, and thought processes  Chest - clear to auscultation, no wheezes, rales or rhonchi, symmetric air entry, no tachypnea, retractions or cyanosis  Heart - normal rate, regular rhythm, normal S1, S2, no murmurs, rubs, clicks or gallops  Abdomen - tenderness noted at incision, dressing removed looks good, staples intact  Wound - clean, dry  Extremities - peripheral pulses normal, no pedal edema, no clubbing or cyanosis      Signed by: Iran Sizer

## 2019-05-30 NOTE — Plan of Care (Signed)
Problem: Moderate/High Fall Risk Score >5  Goal: Patient will remain free of falls  Outcome: Progressing  Flowsheets (Taken 05/29/2019 2000)  High (Greater than 13):   HIGH-Visual cue at entrance to patient's room   HIGH-Bed alarm on at all times while patient in bed   HIGH-Utilize chair pad alarm for patient while in the chair   HIGH-Apply yellow "Fall Risk" arm band   HIGH-Initiate use of floor mats as appropriate   HIGH-Consider use of low bed     Problem: Safety  Goal: Patient will be free from injury during hospitalization  Outcome: Progressing  Flowsheets (Taken 05/28/2019 2302)  Patient will be free from injury during hospitalization:   Assess patient's risk for falls and implement fall prevention plan of care per policy   Provide and maintain safe environment   Ensure appropriate safety devices are available at the bedside   Hourly rounding   Include patient/ family/ care giver in decisions related to safety   Use appropriate transfer methods   Assess for patients risk for elopement and implement Elopement Risk Plan per policy  Goal: Patient will be free from infection during hospitalization  Outcome: Progressing  Flowsheets (Taken 05/28/2019 2302)  Free from Infection during hospitalization:   Assess and monitor for signs and symptoms of infection   Monitor lab/diagnostic results   Monitor all insertion sites (i.e. indwelling lines, tubes, urinary catheters, and drains)   Encourage patient and family to use good hand hygiene technique     Problem: Pain  Goal: Pain at adequate level as identified by patient  Outcome: Progressing  Flowsheets (Taken 05/28/2019 2302)  Pain at adequate level as identified by patient:   Identify patient comfort function goal   Assess for risk of opioid induced respiratory depression, including snoring/sleep apnea. Alert healthcare team of risk factors identified.   Assess pain on admission, during daily assessment and/or before any "as needed" intervention(s)   Reassess pain within  30-60 minutes of any procedure/intervention, per Pain Assessment, Intervention, Reassessment (AIR) Cycle   Evaluate if patient comfort function goal is met   Evaluate patient's satisfaction with pain management progress   Consult/collaborate with Pain Service   Offer non-pharmacological pain management interventions     Problem: Side Effects from Pain Analgesia  Goal: Patient will experience minimal side effects of analgesic therapy  Outcome: Progressing  Flowsheets (Taken 05/28/2019 2302)  Patient will experience minimal side effects of analgesic therapy:   Monitor/assess patient's respiratory status (RR depth, effort, breath sounds)   Assess for changes in cognitive function   Prevent/manage side effects per LIP orders (i.e. nausea, vomiting, pruritus, constipation, urinary retention, etc.)   Evaluate for opioid-induced sedation with appropriate assessment tool (i.e. POSS)     Problem: Renal Instability  Goal: Fluid and electrolyte balance are achieved/maintained  Outcome: Progressing  Flowsheets (Taken 05/30/2019 0122)  Fluid and electrolyte balance are achieved/maintained:   Monitor intake and output every shift   Monitor/assess lab values and report abnormal values   Provide adequate hydration

## 2019-05-31 DIAGNOSIS — I951 Orthostatic hypotension: Secondary | ICD-10-CM | POA: Diagnosis present

## 2019-05-31 DIAGNOSIS — K219 Gastro-esophageal reflux disease without esophagitis: Secondary | ICD-10-CM | POA: Diagnosis present

## 2019-05-31 DIAGNOSIS — E119 Type 2 diabetes mellitus without complications: Secondary | ICD-10-CM

## 2019-05-31 DIAGNOSIS — D696 Thrombocytopenia, unspecified: Secondary | ICD-10-CM | POA: Diagnosis present

## 2019-05-31 LAB — GLUCOSE WHOLE BLOOD - POCT
Whole Blood Glucose POCT: 85 mg/dL (ref 70–100)
Whole Blood Glucose POCT: 81 mg/dL (ref 70–100)
Whole Blood Glucose POCT: 68 mg/dL — ABNORMAL LOW (ref 70–100)
Whole Blood Glucose POCT: 81 mg/dL (ref 70–100)
Whole Blood Glucose POCT: 95 mg/dL (ref 70–100)

## 2019-05-31 MED ORDER — ACETAMINOPHEN 500 MG PO TABS
1000.0000 mg | ORAL_TABLET | Freq: Four times a day (QID) | ORAL | Status: DC
Start: 2019-05-31 — End: 2019-06-13
  Administered 2019-05-31 – 2019-06-13 (×45): 1000 mg via ORAL
  Filled 2019-05-31 (×49): qty 2

## 2019-05-31 MED ORDER — GABAPENTIN 300 MG PO CAPS
300.00 mg | ORAL_CAPSULE | Freq: Three times a day (TID) | ORAL | Status: DC
Start: 2019-05-31 — End: 2019-06-02
  Administered 2019-05-31 – 2019-06-02 (×5): 300 mg via ORAL
  Filled 2019-05-31 (×5): qty 1

## 2019-05-31 NOTE — Progress Notes (Signed)
Arrived via bed.  Pt.  Alert and oriented x3. Lungs clear. No c/o sob. Telemetry SR. Lower extremity bilateral non pitting edema. Continues to c/o abdominal pain at level 7. Staples to abdomen intact. Slight redness noted around middle staples. No drainage noted. Dressing to right permcath dry and intact. Floor RN reports pt. Given all bp meds prior to treatment. Report received from [facility] RN     05/31/19 0919   Dialysis Weight   Pre-Treatment Weight (Kg) 108.4   Scale Type ICU Bed Scale   Vitals   Temp 98.1 F (36.7 C)   Heart Rate 69   Resp Rate 18   BP 111/74   Assessment   Mental Status Alert;Oriented   Cardiac (WDL) WDL   Cardiac Regularity Regular   Cardiac Symptoms None   Cardiac Rhythm Normal Sinus Rhythm   Respiratory  WDL   Respiratory Pattern Regular;Easy   Edema  WDL   RLE Edema Non Pitting Edema   LLE Edema Non Pitting Edema   General Skin Color Appropriate for ethnicity   Skin Condition/Temp Warm   Gastrointestinal (WDL) WDL   Abdomen Inspection Soft;Rounded   GI Symptoms None   Mobility Ambulatory with Assistance   Permacath/Temporary Catheter 05/23/19 Permacath Right   Placement Date/Time: 05/23/19 1342   Inserted by: Papadouris  Access Type: Permacath  Orientation: Right  Access Location: Tunneled, Chest  Catheter Tip Location: svc  Central Line Infection Prevention Education provided?: Yes;No  Hand Hygiene: Chlorh...   Dressing Status and Intervention Dressing Intact;Clean & Dry   Pain Assessment   Charting Type Assessment   Pain Scale Used Numeric Scale (0-10)   Numeric Pain Scale   Pain Score 7   POSS Score 1   Pain Location Abdomen   Pain Orientation Upper   Pain Descriptors Aching   Pain Frequency Intermittent;Increases with movement   Hemodialysis Comments   Pre-Hemodialysis Comments TIME OUT DONE

## 2019-05-31 NOTE — Progress Notes (Signed)
HOSPITALIST PROGRESS NOTE      Patient: [name]  Date: 05/31/2019    LOS: 2 Days  Admission Date: 05/27/2019   MRN: [id number]  Attending: Murlean Hark  MD, MPH                    Please contact via [place]: [phone]                    Pager: [phone]     ASSESSMENT/PLAN     [name] is a 64 y.o. female admitted with Generalized abdominal pain    Interval Summary:     Active Hospital Problems    Diagnosis    Thrombocytopenia    Hypotension    Type 2 diabetes mellitus, with long-term current use of insulin    GERD (gastroesophageal reflux disease)    Generalized abdominal pain    ESRD (end stage renal disease) on dialysis    Peritonitis    Peritoneal dialysis catheter dysfunction, initial encounter       Patient Active Hospital Problem List:   Generalized abdominal pain (05/27/2019)   Peritoneal dialysis catheter dysfunction, initial encounter (05/12/2019)   Peritonitis (05/24/2019)    Assessment: PD catheter removed by general surgery with exploratory laparotomy; pleural fluid growing Pseudomonas and E. Coli.  ID has been following.    Plan: Appreciate ID inputs  Continue Zosyn for now  We will switch to oral Cipro prior to discharge to skilled nursing facility in 1-2 days   Incentive spirometry encouraged  Pain regimen enhanced to optimize pain control     ESRD (end stage renal disease) on dialysis (05/24/2019)    Assessment: Patient was on PD prior to admission; she has been switched to hemodialysis for now; outpatient HD has been arranged for    Plan: Appreciate nephrology inputs    Outpatient dialysis to continue on a Tuesday Thursday Saturday schedule     Thrombocytopenia (05/31/2019)    Assessment: Could be due to the acute infection as above; platelet counts are stable    Plan: Okay to continue heparin for DVT prophylaxis  Monitor daily CBC     Hypotension (05/31/2019)    Assessment: Likely due to the infection above    Plan: Antibiotics as above     Type 2  diabetes mellitus, with long-term current use of insulin (05/31/2019)    Assessment: Chronic and unknown control    Plan: Continue with insulin as above     GERD (gastroesophageal reflux disease) (05/31/2019)    Assessment: Chronic and unknown control    Plan: c/w home dose of protonix   started maalox     Analgesia: Increase Tylenol to 1 g every 6 hours; started gabapentin 300 mg every 8 hours; continue oxycodone 5 to 10 mg every 6 hours as needed    Nutrition: Consistent carb plus renal    GI Prophylaxis: None    DVT Prophylaxis: Heparin    Code Status: DNR/allow natural death    DISPO:  pain control needs to be optimized.  Anticipate 2 to 3 days before discharge to skilled nursing facility       [place] states that abdominal pain is too bad and she is not able to sit up or participate with PT/OT.    Febrile- None   Eating- Well  Coughing- None   Diarrhea- None   Sleeping - Well   Voiding - Well        MEDICATIONS     Current Facility-Administered Medications   Medication Dose Route Frequency    acetaminophen  1,000 mg Oral Q8H    amLODIPine  5 mg Oral Q12H    atorvastatin  40 mg Oral Daily    carvedilol  25 mg Oral Q12H SCH    darbepoetin alfa  60 mcg Subcutaneous Weekly    docusate sodium  100 mg Oral Daily    heparin (porcine)  5,000 Units Subcutaneous Q12H [place]    insulin glargine  10 Units Subcutaneous QAM    insulin lispro  1-8 Units Subcutaneous TID AC    lactobacillus/streptococcus  1 capsule Oral Daily    pantoprazole  40 mg Oral QAM AC    piperacillin-tazobactam  2.25 g Intravenous Q8H    sevelamer  800 mg Oral TID MEALS    vancomycin  125 mg Oral Q6H       PHYSICAL EXAM     Vitals:    05/31/19 1639   BP: 143/74   Pulse: 70   Resp: 16   Temp: 98.8 F (37.1 C)   SpO2: 96%       Temperature: Temp  Min: 97.3 F (36.3 C)  Max: 98.8 F (37.1 C)  Pulse: Pulse  Min: 63  Max: 77  Respiratory: Resp  Min: 14  Max: 18  Non-Invasive BP: BP  Min: 98/60  Max: 186/79  Pulse  Oximetry SpO2  Min: 93 %  Max: 96 %    Intake and Output Summary (Last 24 hours) at Date Time    Intake/Output Summary (Last 24 hours) at 05/31/2019 1643  Last data filed at 05/31/2019 1300  Gross per 24 hour   Intake 630 ml   Output 2050 ml   Net -1420 ml       Physical Exam   Constitutional: She is oriented to person, place, and time. No distress.   In mild distress due to abdominal tenderness during palpation   HENT:   Head: Normocephalic and atraumatic.   Right Ear: External ear normal.   Left Ear: External ear normal.   Mouth/Throat: Oropharynx is clear and moist.   Eyes: Pupils are equal, round, and reactive to light.   Neck: No JVD present. No tracheal deviation present. No thyromegaly present.   Cardiovascular: Normal rate, regular rhythm, normal heart sounds and intact distal pulses. Exam reveals no gallop and no friction rub.   No murmur heard.  Pulmonary/Chest: Effort normal and breath sounds normal. No stridor. No respiratory distress. She has no wheezes. She has no rales. She exhibits no tenderness.   Abdominal: Soft. Bowel sounds are normal. She exhibits no distension and no mass. There is abdominal tenderness. There is no rebound and no guarding.   Musculoskeletal:         General: No tenderness, deformity or edema. Normal range of motion.      Cervical back: Normal range of motion and neck supple.   Lymphadenopathy:     She has no cervical adenopathy.   Neurological: She is alert and oriented to person, place, and time. She has normal reflexes. No cranial nerve deficit. She exhibits normal muscle tone. Gait normal. Coordination normal. GCS score is 15.   Skin: Skin is warm and dry. No rash noted. She is not diaphoretic. No erythema. No pallor.   Temporary dialysis catheter in place on the  right chest wall   Psychiatric: Mood, memory, affect and judgment normal.       LABS     Recent Labs   Lab 05/30/19  0311 05/29/19  1302 05/28/19  0437   WBC 7.38 2.12* 12.77*   RBC 2.76* 2.67* 3.37*   Hgb 7.8* 7.6*  9.9*   Hematocrit 27.2* 26.1* 33.1*   MCV 98.6* 97.8* 98.2*   Platelets 125* 116* 149       Recent Labs   Lab 05/30/19  0311 05/29/19  1302 05/28/19  0437 05/27/19  0507 05/26/19  0315   Sodium 139 136 137 139 141   Potassium 3.7 4.0 4.7 4.1 3.8   Chloride 101 102 105 103 105   CO2 26 26 20* 28 27   BUN 20.0* 29.0* 24.0* 13.0 23.0*   Creatinine 4.3* 5.0* 4.1* 3.0* 4.0*   Glucose 112* 146* 194* 155* 127*   Calcium 7.0* 6.9* 7.1* 7.8* 7.5*       Recent Labs   Lab 05/30/19  0311 05/29/19  1302 05/28/19  0437   ALT <6 <6 <6   AST (SGOT) 10 10 7    Bilirubin, Total 0.3 0.2 0.4   Albumin 1.4* 1.4* 1.6*   Alkaline Phosphatase 99 89 95                   Microbiology Results     Procedure Component Value Units Date/Time    Anaerobic culture [[id number]] Collected: 05/27/19 2343    Specimen: Other from Drainage Updated: 05/31/19 1205    Narrative:      blue swab  ORDER#: [id number]                                    ORDERED BY: Kalman Jewels  SOURCE: Drainage peritoneal fluid                    COLLECTED:  05/27/19 23:43  ANTIBIOTICS AT COLL.:                                RECEIVED :  05/28/19 07:56  ORDER ENTRY COMMENTS:  blue swab  Culture, Anaerobic Bacteria                PRELIM      05/31/19 12:05  05/31/19   No growth to date, final report to follow      Anaerobic culture [[id number]] Collected: 05/27/19 2343    Specimen: Other from Wound Updated: 05/31/19 1205    Narrative:      ORDER#: [id number]                                    ORDERED BY: Kalman Jewels  SOURCE: Wound abdominal wound                        COLLECTED:  05/27/19 23:43  ANTIBIOTICS AT COLL.:                                RECEIVED :  05/28/19 07:56  Culture, Anaerobic Bacteria                PRELIM  05/31/19 12:05  05/31/19   No growth to date, final report to follow      Blood Culture Aerobic/Anaerobic #1 [[id number]] Collected: 05/27/19 0507    Specimen: Arm from Blood, Venipuncture Updated: 05/31/19 1021    Narrative:      ORDER#: [id number]                                     ORDERED BY: CHUNG, SORA  SOURCE: Blood, Venipuncture arm                      COLLECTED:  05/27/19 05:07  ANTIBIOTICS AT COLL.:                                RECEIVED :  05/27/19 09:50  Culture Blood Aerobic and Anaerobic        PRELIM      05/31/19 10:21  05/28/19   No Growth after 1 day/s of incubation.  05/29/19   No Growth after 2 day/s of incubation.  05/30/19   No Growth after 3 day/s of incubation.  05/31/19   No Growth after 4 day/s of incubation.      Blood Culture Aerobic/Anaerobic #2 [[id number]] Collected: 05/27/19 0507    Specimen: Arm from Blood, Venipuncture Updated: 05/31/19 1021    Narrative:      ORDER#: [id number]                                    ORDERED BY: CHUNG, SORA  SOURCE: Blood, Venipuncture arm                      COLLECTED:  05/27/19 05:07  ANTIBIOTICS AT COLL.:                                RECEIVED :  05/27/19 09:50  Culture Blood Aerobic and Anaerobic        PRELIM      05/31/19 10:21  05/28/19   No Growth after 1 day/s of incubation.  05/29/19   No Growth after 2 day/s of incubation.  05/30/19   No Growth after 3 day/s of incubation.  05/31/19   No Growth after 4 day/s of incubation.      COVID-19 (SARS-COV-2) [employer]) [[id number]] Collected: 05/27/19 0624    Specimen: Nasopharyngeal Swab from Nasopharynx Updated: 05/27/19 0656     Purpose of COVID testing Screening     SARS-CoV-2 Specimen Source Nasopharyngeal     SARS CoV 2 Overall Result Negative     Comment: Test performed using the Abbott ID NOW EUA assay.  Please see Fact Sheets for patients and providers located at:  http://[facility].info/  This test is for the qualitative detection of SARS-CoV-2  (COVID19) nucleic acid. Viral nucleic acids may persist in vivo,  independent of viability. Detection of viral nucleic acid does  not imply the presence of infectious virus, or that virus  nucleic acid is the cause of clinical symptoms. Negative  results should be treated as  presumptive and, if inconsistent  with clinical signs and symptoms or necessary for patient  management, should be tested with an alternative molecular  assay. Negative results do not preclude  SARS-CoV-2 infection  and should not be used as the sole basis for patient  management decisions. Invalid results may be due to inhibiting  substances in the specimen and recollection should occur.         Narrative:      o Collect and clearly label specimen type:  o Upper respiratory specimen: One Nasopharyngeal Dry Swab NO  Transport Media.  o Hand deliver to laboratory ASAP  Indication for testing->Extended care facility admission to  semi private room    Culture + Gram Lenise Arena, Body Fluid [[id number]] Collected: 05/27/19 0534    Specimen: Body Fluid from Paracentesis Fluid Updated: 05/31/19 1428    Narrative:      [id number] called Micro Results of Gram Stain. Results read back by: [id number], by  [id number] on 05/27/2019 at 14:31  < 19ml  ORDER#: [id number]                                    ORDERED BY: CHUNG, SORA  SOURCE: Paracentesis Fluid peritoneal dialysis cathetCOLLECTED:  05/27/19 05:34  ANTIBIOTICS AT COLL.:                                RECEIVED :  05/27/19 10:09  ORDER ENTRY COMMENTS:  < 48ml  [id number] called Micro Results of Gram Stain. Results read back by: [id number], by [id number] on 05/27/2019 at 14:31  Stain, Gram                                FINAL       05/27/19 14:31  05/27/19   No Squamous epithelial cells seen             No WBCs seen             Rare Gram positive cocci             Stain performed on Cytospin (concentrated) specimen  Culture and Gram Stain, Aerobic, Body FluidFINAL       05/31/19 14:28  05/30/19   The volume of sample received with this order is less than             the recommended volume. Submission of low volumes may             adversely affect recovery of pathogens and culture results             may be compromised.  05/31/19   No growth      Fungus culture [[id number]] Collected: 05/27/19 0259     Specimen: Other from Drainage Updated: 05/29/19 1220    Narrative:      blue swab  ORDER#: [id number]                                    ORDERED BY: Kalman Jewels  SOURCE: Drainage peritoneal fluid                    COLLECTED:  05/27/19 02:59  ANTIBIOTICS AT COLL.:                                RECEIVED :  05/28/19 07:56  ORDER ENTRY COMMENTS:  blue swab  Stain, Fungal                              FINAL       05/29/19 12:20  05/29/19   No Fungal or Yeast Elements Seen  Culture Fungus                             PENDING      Fungus culture [[id number]] Collected: 05/27/19 2343    Specimen: Other from Wound Updated: 05/29/19 1220    Narrative:      ORDER#: [id number]                                    ORDERED BY: Kalman Jewels  SOURCE: Wound abdominal wound                        COLLECTED:  05/27/19 23:43  ANTIBIOTICS AT COLL.:                                RECEIVED :  05/28/19 07:56  Stain, Fungal                              FINAL       05/29/19 12:20  05/29/19   No Fungal or Yeast Elements Seen  Culture Fungus                             PENDING      Wound culture & gram stain [[id number]] Collected: 05/27/19 2343    Specimen: Wound Updated: 05/30/19 0829    Narrative:      blue swab  ORDER#: [id number]                                    ORDERED BY: Kalman Jewels  SOURCE: Wound peritoneal fluid                       COLLECTED:  05/27/19 23:43  ANTIBIOTICS AT COLL.:                                RECEIVED :  05/28/19 07:56  ORDER ENTRY COMMENTS:  blue swab  Stain, Gram                                FINAL       05/28/19 13:06  05/28/19   Few WBCs             Rare Squamous epithelial cells             No organisms seen  Culture and Gram Stain, Aerobic, Wound     FINAL       05/30/19 08:29   +  05/29/19   Light growth of Pseudomonas aeruginosa               Refer to susceptibilities on culture #[id number]  Wound culture & gram stain [[id number]] Collected: 05/27/19 0221    Specimen: Wound Updated: 05/30/19 0828     Narrative:      ORDER#: [id number]                                    ORDERED BY: Kalman Jewels  SOURCE: Wound abdominal wound                        COLLECTED:  05/27/19 02:21  ANTIBIOTICS AT COLL.:                                RECEIVED :  05/28/19 07:56  Stain, Gram                                FINAL       05/28/19 13:03  05/28/19   Moderate WBCs             Few Squamous epithelial cells             No organisms seen  Culture and Gram Stain, Aerobic, Wound     FINAL       05/30/19 08:28   +  05/30/19   Light growth of Pseudomonas aeruginosa      _____________________________________________________________________________                                  P.aeruginosa    ANTIBIOTICS                     MIC  INTRP      _____________________________________________________________________________  Amikacin                        <=8    S        Aztreonam                        8     S        Cefepime                         2     S        Ceftazidime                     <=2    S        Ciprofloxacin                 <=0.25   S        Gentamicin                      <=2    S        Levofloxacin                   <=0.5   S        Meropenem                      <=0.5   S        Piperacillin/Tazobactam  4/4    S        Tobramycin                      <=2    S        _____________________________________________________________________________            S=SUSCEPTIBLE     I=INTERMEDIATE     R=RESISTANT                            N/S=NON-SUSCEPTIBLE  _____________________________________________________________________________             CT Abd/Pelvis with IV Contrast only    Result Date: 05/27/2019  1. Fluid-filled mildly distended loops of small bowel with distal decompressed small bowel loops. Findings may represent early or partial small bowel obstruction. Enteritis is felt to be less likely. 2. Trace bilateral pleural effusions. 3. Additional chronic findings as above. Delaney Meigs, MD  05/27/2019 6:07 AM       Echo Results     None          I have personally reviewed the patient's labs, medications, and imaging    Total visit time = 35 mins ; more than 50% spent counseling/coordinating care    Signed,    Murlean Hark MD, MPH  4:43 PM 05/31/2019   Please contact via [place]: [phone]  Pager: [phone]       This chart was generated using a [place] which does not employ spell-checking or grammar-checking features. It was dictated, all or in part, in a busy and often noisy patient care environment. I have taken all usual measures to dictate carefully and to review all aspects of this chart. Nonetheless, given the known and well-documented performance characteristics of voice recognition software in such patient care environments, this dictation still may contain unrecognized and wholly unintended errors or omissions.

## 2019-05-31 NOTE — Plan of Care (Signed)
Problem: Renal Instability  Goal: Fluid and electrolyte balance are achieved/maintained  Flowsheets (Taken 05/30/2019 0122 by Gavin Pound, RN)  Fluid and electrolyte balance are achieved/maintained:   Monitor intake and output every shift   Monitor/assess lab values and report abnormal values   Provide adequate hydration     Problem: Renal Instability  Goal: Free from infection  Flowsheets (Taken 05/29/2019 1323)  Free from infection: Monitor/assess for signs and symptoms of infection     Problem: Patient Receiving Advanced Renal Therapies  Goal: Therapy access site remains intact  Flowsheets (Taken 05/29/2019 1250 by Priscille Heidelberg, RN)  Therapy access site remains intact:   Assess therapy access site   Change therapy access site dressing as needed

## 2019-05-31 NOTE — Plan of Care (Signed)
Problem: Safety  Goal: Patient will be free from injury during hospitalization  Outcome: Progressing  Flowsheets (Taken 05/31/2019 1302)  Patient will be free from injury during hospitalization:   Assess patient's risk for falls and implement fall prevention plan of care per policy   Provide and maintain safe environment   Use appropriate transfer methods   Ensure appropriate safety devices are available at the bedside   Include patient/ family/ care giver in decisions related to safety   [place] rounding   Assess for patients risk for elopement and implement Elopement Risk Plan per policy     Problem: Pain  Goal: Pain at adequate level as identified by patient  Outcome: Progressing  Flowsheets (Taken 05/31/2019 1302)  Pain at adequate level as identified by patient:   Identify patient comfort function goal   Assess for risk of opioid induced respiratory depression, including snoring/sleep apnea. Alert healthcare team of risk factors identified.   Assess pain on admission, during daily assessment and/or before any "as needed" intervention(s)   Reassess pain within 30-60 minutes of any procedure/intervention, per Pain Assessment, Intervention, Reassessment (AIR) Cycle   Evaluate if patient comfort function goal is met   Evaluate patient's satisfaction with pain management progress   Offer non-pharmacological pain management interventions     Problem: Compromised Tissue integrity  Goal: Nutritional status is improving  Outcome: Progressing  Flowsheets (Taken 05/31/2019 1302)  Nutritional status is improving:   Assist patient with eating   Allow adequate time for meals   Encourage patient to take dietary supplement(s) as ordered

## 2019-05-31 NOTE — Progress Notes (Signed)
Uf of 2 liters as ordered. Pt. Alert and oriented x3. Right permcath worked fair. Dressing dry and intact. Lungs clear. No sob noted. Pt. Stated abdominal pain at level 6 at present. Telemetry SR. Staples to abdomen intact. Report given to  [place] RN     05/31/19 1300   Treatment Summary   Time Off Machine 1302   Duration of Treatment (Hours) 3.5   Dialyzer Clearance Moderately streaked   Fluid Volume Off (mL) 2500   Prime Volume (mL) 200   Rinseback Volume (mL) 200   Fluid Given: Normal Saline (mL) 100   Fluid Given: PRBC  0 mL   Fluid Given: Albumin (mL) 0   Fluid Given: Other (mL) 0   Total Fluid Given 500   Hemodialysis Net Fluid Removed 2000   Post Treatment Assessment   Post-Treatment Weight (Kg) 106.4   Patient Response to Treatment   (TOLERATED WELL)   Permacath/Temporary Catheter 05/23/19 Permacath Right   Placement Date/Time: 05/23/19 1342   Inserted by: Pap[place]ouris  Access Type: Permacath  Orientation: Right  Access Location: Tunneled, Chest  Catheter Tip Location: svc  Central Line Infection Prevention Education provided?: Yes;No  Hand Hygiene: [place]...   Catheter Lumen Volume Venous 1.7 mL   Catheter Lumen Volume Arterial 1.7 mL   Dressing Status and Intervention Dressing Intact;Clean & Dry   Tego Caps on Catheter Yes   NEW Tego Caps placed (Date) 05/31/19   Vitals   Temp 98.2 F (36.8 C)   Heart Rate 68   Resp Rate 18   BP 186/79   Assessment   Mental Status Alert;Oriented   Cardiac (WDL) WDL   Cardiac Regularity Regular   Cardiac Symptoms None   Cardiac Rhythm Normal Sinus Rhythm   Respiratory  WDL   Respiratory Pattern Regular;Easy   Edema  WDL   General Skin Color Appropriate for ethnicity   Skin Condition/Temp Warm   Gastrointestinal (WDL) WDL   Abdomen Inspection Soft;Rounded   GI Symptoms None   Mobility Ambulatory with Assistance   Pain Assessment   Charting Type Reassessment   Pain Scale Used Numeric Scale (0-10)   Numeric Pain Scale   Pain Score 6   POSS Score 1   Pain Location  Abdomen   Pain Descriptors Aching   Pain Frequency Intermittent;Increases with movement   Education   Person taught Patient   Knowledge basis Minimal   Topics taught Access care   [place] Understanding   Bedside Nurse Communication   Name of bedside RN - post dialysis [place] RN

## 2019-05-31 NOTE — Plan of Care (Signed)
Problem: Moderate/High Fall Risk Score >5  Goal: Patient will remain free of falls  Outcome: Progressing  Flowsheets (Taken 05/30/2019 2300)  High (Greater than 13):   HIGH-Visual cue at entrance to patient's room   HIGH-Bed alarm on at all times while patient in bed   HIGH-Utilize chair pad alarm for patient while in the chair   HIGH-Initiate use of floor mats as appropriate   HIGH-Consider use of low bed   HIGH-Apply yellow "Fall Risk" arm band     Problem: Safety  Goal: Patient will be free from injury during hospitalization  Outcome: Progressing  Goal: Patient will be free from infection during hospitalization  Outcome: Progressing  Flowsheets (Taken 05/28/2019 2302 by Gavin Pound, RN)  Free from Infection during hospitalization:   Assess and monitor for signs and symptoms of infection   Monitor lab/diagnostic results   Monitor all insertion sites (i.e. indwelling lines, tubes, urinary catheters, and drains)   Encourage patient and family to use good hand hygiene technique     Problem: Pain  Goal: Pain at adequate level as identified by patient  Outcome: Progressing     Problem: Side Effects from Pain Analgesia  Goal: Patient will experience minimal side effects of analgesic therapy  Outcome: Progressing     Problem: Discharge Barriers  Goal: Patient will be discharged home or other facility with appropriate resources  Outcome: Progressing     Problem: Psychosocial and Spiritual Needs  Goal: Demonstrates ability to cope with hospitalization/illness  Outcome: Progressing     Problem: Altered GI Function  Goal: Fluid and electrolyte balance are achieved/maintained  Outcome: Progressing  Flowsheets (Taken 05/28/2019 2302 by Gavin Pound, RN)  Fluid and electrolyte balance are achieved/maintained:   Monitor intake and output every shift   Monitor/assess lab values and report abnormal values   Provide adequate hydration   Monitor daily weight  Goal: Elimination patterns are normal or  improving  Outcome: Progressing  Goal: Nutritional intake is adequate  Outcome: Progressing  Goal: Mobility/Activity is maintained at optimal level for patient  Outcome: Progressing  Goal: No bleeding  Outcome: Progressing     Problem: Compromised Tissue integrity  Goal: Damaged tissue is healing and protected  Outcome: Progressing  Goal: Nutritional status is improving  Outcome: Progressing     Problem: Renal Instability  Goal: Fluid and electrolyte balance are achieved/maintained  Outcome: Progressing  Goal: Free from infection  Outcome: Progressing     Problem: Patient Receiving Advanced Renal Therapies  Goal: Therapy access site remains intact  Outcome: Progressing

## 2019-05-31 NOTE — Progress Notes (Signed)
Patient in off unit and at dialysis.  On regular diet.  I will see patient tomorrow.

## 2019-05-31 NOTE — Progress Notes (Signed)
Patient was s/p abdominal PD cath removal, given IV antibiotics zosyn to help with wound healing and infection prevention, also given PO vancomycin 50 mg and tylenol 1000 mg for pain control. She requested for pain medication and was given Morphine to help manage her pain . Continues on IV fluids at 75 ml per hour for hydration, will continue to monitor

## 2019-05-31 NOTE — Progress Notes (Signed)
[facility] PROGRESS NOTE  Aaron Edelman, x [phone] [facility] Spectralink)      Date Time: 05/31/19 5:18 PM  Patient Name: [name]  Attending Physician: Murlean Hark, MD    CC: follow-up ESRD    Assessment:     1. ESRD, on PD until 5/3 when she was switched to HD  2. Abdominal pain due to PD associated peritonitis - growing Pseudomonas and E. Coli, antibiotics change to zosyn  3. S/p PD catheter removal on 05/27/19  4. Anemia due to CKD and iron deficiency  5. Secondary hyperparathyroidism     Recommendations:     1. HD yesterday & TTS; upon discharge, plan to continue HD at [facility]   - will plan for additional UF on Monday  2. Follow intraop cultures; s/p PD catheter removal, + Pseudomonas -- previous PD fluid culture grew E. Coli   3. Continue curretn BP Rx  4. sevelamer 800mg  tid with meals (phos is well controlled)  5. Aranesp; received IV iron at the end of April      Case discussed with: pt    Crimson Dubberly Oretha Ellis, MD  [facility]  [phone] (office)  X [phone] ([place])    Subjective: c/o of abdominal pain    Review of Systems:   Review of Systems -  Elevated BP. No CP or SOB    Physical Exam:     Vitals:    05/31/19 1300 05/31/19 1407 05/31/19 1412 05/31/19 1639   BP: 186/79 186/79 125/72 143/74   Pulse: 68  72 70   Resp: 18  16 16    Temp: 98.2 F (36.8 C)  98.6 F (37 C) 98.8 F (37.1 C)   TempSrc:   Oral Oral   SpO2:   93% 96%   Weight:       Height:           Intake and Output Summary (Last 24 hours) at Date Time    Intake/Output Summary (Last 24 hours) at 05/31/2019 1718  Last data filed at 05/31/2019 1300  Gross per 24 hour   Intake 630 ml   Output 2050 ml   Net -1420 ml       General: awake, alert, oriented x 3, no acute distress.  HEENT: sclera anicteric  Neck: supple  Cardiovascular: regular rate and rhythm  Lungs: clear to auscultation bilaterally, bilateral air entry, normal work of breathing  Abdomen: soft, very tender to light palpation  Extremities:  trace edema  Neuro: A+O x 3, no gross motor/sensory deficits    AccessL R HDTC    Meds:      Scheduled Meds: PRN Meds:    acetaminophen, 1,000 mg, Oral, 4 times per day  amLODIPine, 5 mg, Oral, Q12H  atorvastatin, 40 mg, Oral, Daily  carvedilol, 25 mg, Oral, Q12H SCH  darbepoetin alfa, 60 mcg, Subcutaneous, Weekly  docusate sodium, 100 mg, Oral, Daily  gabapentin, 300 mg, Oral, Q8H SCH  heparin (porcine), 5,000 Units, Subcutaneous, Q12H SCH  insulin glargine, 10 Units, Subcutaneous, QAM  insulin lispro, 1-8 Units, Subcutaneous, TID AC  lactobacillus/streptococcus, 1 capsule, Oral, Daily  pantoprazole, 40 mg, Oral, QAM AC  piperacillin-tazobactam, 2.25 g, Intravenous, Q8H  sevelamer, 800 mg, Oral, TID MEALS  vancomycin, 125 mg, Oral, Q6H          Continuous Infusions:   sodium chloride 20 mL/hr at 05/27/19 1100    sodium chloride      lactated ringers      sodium chloride, ,  PRN  bisacodyl, 10 mg, Daily PRN  dextrose, 15 g of glucose, PRN   And  dextrose, 12.5 g, PRN   And  glucagon (rDNA), 1 mg, PRN  diphenhydrAMINE, 25 mg, Q6H PRN  morphine, 2 mg, Q4H PRN  naloxone, 0.2 mg, PRN  ondansetron, 4 mg, Once PRN  ondansetron, 4 mg, Q6H PRN   Or  ondansetron, 4 mg, Q6H PRN  oxyCODONE, 5 mg, Q6H PRN   Or  oxyCODONE, 10 mg, Q6H PRN  petrolatum, , PRN              Labs:     Recent Labs   Lab 05/30/19  0311 05/29/19  1302 05/28/19  0437   WBC 7.38 2.12* 12.77*   Hgb 7.8* 7.6* 9.9*   Hematocrit 27.2* 26.1* 33.1*   Platelets 125* 116* 149     Recent Labs   Lab 05/30/19  0311 05/29/19  1302 05/28/19  0437   Sodium 139 136 137   Potassium 3.7 4.0 4.7   Chloride 101 102 105   CO2 26 26 20*   BUN 20.0* 29.0* 24.0*   Creatinine 4.3* 5.0* 4.1*   Calcium 7.0* 6.9* 7.1*   Albumin 1.4* 1.4* 1.6*   Glucose 112* 146* 194*   EGFR 12.5 10.5 13.2             Invalid input(s): LEUKOCYTESUR        Imaging personally reviewed, including: No results found.        Signed by: Renard Hamper, MD

## 2019-05-31 NOTE — Progress Notes (Incomplete)
Patient is a 64 year old female how is A/Ox4. She states having pain in her abdomen. Pain medication given. IV antibiotics given. Patient was in dialysis. Skin assessment completed.

## 2019-06-01 LAB — GLUCOSE WHOLE BLOOD - POCT
Whole Blood Glucose POCT: 91 mg/dL (ref 70–100)
Whole Blood Glucose POCT: 125 mg/dL — ABNORMAL HIGH (ref 70–100)
Whole Blood Glucose POCT: 134 mg/dL — ABNORMAL HIGH (ref 70–100)
Whole Blood Glucose POCT: 194 mg/dL — ABNORMAL HIGH (ref 70–100)

## 2019-06-01 MED ORDER — MORPHINE SULFATE 2 MG/ML IJ/IV SOLN (WRAP)
1.0000 mg | Status: DC | PRN
Start: 2019-06-01 — End: 2019-06-02
  Administered 2019-06-01 – 2019-06-02 (×2): 1 mg via INTRAVENOUS
  Filled 2019-06-01 (×2): qty 1

## 2019-06-01 MED ORDER — HEPARIN SODIUM (PORCINE) 5000 UNIT/ML IJ SOLN
5000.00 [IU] | Freq: Two times a day (BID) | INTRAMUSCULAR | Status: DC
Start: 2019-06-02 — End: 2019-06-02

## 2019-06-01 MED ORDER — GUAIFENESIN-DM 100-10 MG/5ML PO SYRP
5.00 mL | ORAL_SOLUTION | ORAL | Status: DC | PRN
Start: 2019-06-01 — End: 2019-06-13
  Administered 2019-06-01: 21:00:00 5 mL via ORAL
  Filled 2019-06-01: qty 5

## 2019-06-01 MED ORDER — MORPHINE SULFATE 2 MG/ML IJ/IV SOLN (WRAP)
1.0000 mg | Status: DC | PRN
Start: 2019-06-01 — End: 2019-06-01

## 2019-06-01 MED ORDER — LEVOFLOXACIN 250 MG PO TABS
250.0000 mg | ORAL_TABLET | ORAL | Status: DC
Start: 2019-06-01 — End: 2019-06-08
  Administered 2019-06-01 – 2019-06-07 (×4): 250 mg via ORAL
  Filled 2019-06-01 (×5): qty 1

## 2019-06-01 NOTE — Plan of Care (Signed)
Discharge Recommendation: SNF(if pain controlled, may progess home w/ HHPT/OT)  DME Recommended for Discharge: Front wheel walker(requires cont. assessment)    Is an Occupational Therapy Evaluation Indicated at this time? This patient is already on OT caseload.     Treatment/Interventions: Exercise, Gait training, LE strengthening/ROM, Endurance training, Bed mobility, Continued evaluation  PT Frequency: 2-3x/wk     PMP Activity: Step 6 - Walks in Room  Distance Walked (ft) (Step 6,7): 30 Feet(30)  (Please See Therapy Evaluation for device and assistance level needed)    Goals:   Goals  Goal Formulation: With patient  Time for Goal Acheivement: By time of discharge  Pt Will Go Supine To Sit: modified independent, to maximize functional mobility and independence  Pt Will Perform Sit To Supine: modified independent, to maximize functional mobility and independence  Pt Will Perform Sit to Stand: with supervision, to maximize functional mobility and independence, Partly met  Pt Will Transfer Bed/Chair: with rolling walker, with supervision, to maximize functional mobility and independence  Pt Will Ambulate: 51-100 feet, with rolling walker, with stand by assist, to maximize functional mobility and independence

## 2019-06-01 NOTE — Progress Notes (Signed)
POST OPERATIVE PROGRESS NOTE    Date Time: 06/01/19 6:09 AM  Patient Name: [name]  Surgical Attending: Herbert Moors., MD    Subjective:     No new complaints.   Diet: Tolerating Diet  Solid diet    I had flatus and BM.    Objective:     Vitals:    Temp (24hrs), Avg:98.4 F (36.9 C), Min:97.3 F (36.3 C), Max:99.9 F (37.7 C)      Temp: Temp: 98.4 F (36.9 C)   HR: Heart Rate: 64   BP: BP: 121/81   RR: Resp Rate: 17   SpO2 SpO2: 95 %   POCT Glucose: POCT Glucose Result (Read Only): 95     Physical Exam:     General: awake, alert, oriented x 3; no acute distress.  Neck: supple, no lymphadenopathy, no thyromegaly  Cardiovascular: regular rate and rhythm, no murmurs, rubs or gallops  Lungs: clear to auscultation bilaterally, without wheezing, rhonchi, or rales  Abdomen: soft, non-tender, non-distended; no palpable masses, no hepatosplenomegaly, normoactive bowel sounds, no rebound or guarding; incision clean, dry and intact.    Skin: no rashes or lesions noted      Results     Procedure Component Value Units Date/Time    Glucose Whole Blood - POCT [[id number]] Collected: 05/31/19 2121     Updated: 05/31/19 2126     Whole Blood Glucose POCT 95 mg/dL     Glucose Whole Blood - POCT [[id number]] Collected: 05/31/19 1711     Updated: 05/31/19 1716     Whole Blood Glucose POCT 81 mg/dL     Glucose Whole Blood - POCT [[id number]]  (Abnormal) Collected: 05/31/19 1637     Updated: 05/31/19 1657     Whole Blood Glucose POCT 68 mg/dL     Glucose Whole Blood - POCT [[id number]] Collected: 05/31/19 1409     Updated: 05/31/19 1436     Whole Blood Glucose POCT 81 mg/dL     Culture + Gram Stain,Aerobic, Body Fluid [[id number]] Collected: 05/27/19 0534    Specimen: Body Fluid from Paracentesis Fluid Updated: 05/31/19 1428    Narrative:      [id number] called Micro Results of Gram Stain. Results read back by: [id number], by  [id number] on 05/27/2019 at 14:31  < 23ml  ORDER#: [id number]                                    ORDERED  BY: CHUNG, SORA  SOURCE: Paracentesis Fluid peritoneal dialysis cathetCOLLECTED:  05/27/19 05:34  ANTIBIOTICS AT COLL.:                                RECEIVED :  05/27/19 10:09  ORDER ENTRY COMMENTS:  < 61ml  [id number] called Micro Results of Gram Stain. Results read back by: [id number], by [id number] on 05/27/2019 at 14:31  Stain, Gram                                FINAL       05/27/19 14:31  05/27/19   No Squamous epithelial cells seen             No WBCs seen             Rare Gram positive cocci  Stain performed on Cytospin (concentrated) specimen  Culture and Gram Stain, Aerobic, Body FluidFINAL       05/31/19 14:28  05/30/19   The volume of sample received with this order is less than             the recommended volume. Submission of low volumes may             adversely affect recovery of pathogens and culture results             may be compromised.  05/31/19   No growth      Anaerobic culture [[id number]] Collected: 05/27/19 2343    Specimen: Other from Drainage Updated: 05/31/19 1205    Narrative:      blue swab  ORDER#: [id number]                                    ORDERED BY: Kalman Jewels  SOURCE: Drainage peritoneal fluid                    COLLECTED:  05/27/19 23:43  ANTIBIOTICS AT COLL.:                                RECEIVED :  05/28/19 07:56  ORDER ENTRY COMMENTS:  blue swab  Culture, Anaerobic Bacteria                PRELIM      05/31/19 12:05  05/31/19   No growth to date, final report to follow      Anaerobic culture [[id number]] Collected: 05/27/19 2343    Specimen: Other from Wound Updated: 05/31/19 1205    Narrative:      ORDER#: [id number]                                    ORDERED BY: Kalman Jewels  SOURCE: Wound abdominal wound                        COLLECTED:  05/27/19 23:43  ANTIBIOTICS AT COLL.:                                RECEIVED :  05/28/19 07:56  Culture, Anaerobic Bacteria                PRELIM      05/31/19 12:05  05/31/19   No growth to date, final report to follow      Blood  Culture Aerobic/Anaerobic #2 [[id number]] Collected: 05/27/19 0507    Specimen: Arm from Blood, Venipuncture Updated: 05/31/19 1021    Narrative:      ORDER#: [id number]                                    ORDERED BY: CHUNG, SORA  SOURCE: Blood, Venipuncture arm                      COLLECTED:  05/27/19 05:07  ANTIBIOTICS AT COLL.:  RECEIVED :  05/27/19 09:50  Culture Blood Aerobic and Anaerobic        PRELIM      05/31/19 10:21  05/28/19   No Growth after 1 day/s of incubation.  05/29/19   No Growth after 2 day/s of incubation.  05/30/19   No Growth after 3 day/s of incubation.  05/31/19   No Growth after 4 day/s of incubation.      Blood Culture Aerobic/Anaerobic #1 [[id number]] Collected: 05/27/19 0507    Specimen: Arm from Blood, Venipuncture Updated: 05/31/19 1021    Narrative:      ORDER#: [id number]                                    ORDERED BY: CHUNG, SORA  SOURCE: Blood, Venipuncture arm                      COLLECTED:  05/27/19 05:07  ANTIBIOTICS AT COLL.:                                RECEIVED :  05/27/19 09:50  Culture Blood Aerobic and Anaerobic        PRELIM      05/31/19 10:21  05/28/19   No Growth after 1 day/s of incubation.  05/29/19   No Growth after 2 day/s of incubation.  05/30/19   No Growth after 3 day/s of incubation.  05/31/19   No Growth after 4 day/s of incubation.            Radiology Results (24 Hour)     ** No results found for the last 24 hours. **          Assessment/Plan:     5 Days Post-Op  Procedure(s):  REMOVAL, PERITONEAL DIALYSIS CATHETER  EXPLORATORY LAPAROTOMY    Remove staples on POD # 14.  Disposition planning per primary team.    Herbert Moors., MD  06/01/2019 6:09 AM

## 2019-06-01 NOTE — PT Progress Note (Signed)
Physical Therapy Note    [facility]  Physical Therapy Treatment    Patient:  [name]  MRN#:  [id number]  Unit:  [address]  Room/Bed:  M4268/T4196.B    Time of treatment:  Time Calculation  PT Received On: 06/01/19  Start Time: 1558  Stop Time: 1622  Time Calculation (min): 24 min            Chart Review and Collaboration with Care Team: 5 minutes, not included in above time.    PT Visit Number: 2    Precautions:   Precautions  Weight Bearing Status: no restrictions  Aspiration Precautions: see MD/RN orders  Other Precautions: Falls    Personal Protective Equipment (PPE)  gloves, procedure mask, helmet with face shield, eye shield/covering, surgical / bouffant cap and shoe covers    Updated X-Rays/Tests/Labs:  Lab Results   Component Value Date/Time    HGB 7.8 (L) 05/30/2019 03:11 AM    HCT 27.2 (L) 05/30/2019 03:11 AM    K 3.7 05/30/2019 03:11 AM    NA 139 05/30/2019 03:11 AM    INR 0.9 03/18/2006 03:00 PM    TROPI 0.04 05/17/2019 06:20 PM    TROPI 0.05 05/17/2019 01:37 PM    TROPI 0.01 03/18/2006 06:10 PM       All imaging reviewed, please see chart for details.      Subjective:  "I will try."    Patient Goal: To get pain medication     Pain Assessment  Pain Assessment: Numeric Scale (0-10)  Pain Score: 5-moderate pain  POSS Score: Awake and Alert  Pain Location: (left side of body abdomen to arm pit )  Pain Orientation: Left  Pain Descriptors: Aching  Pain Frequency: Intermittent  Effect of Pain on Daily Activities: moderate  Pain Intervention(s): Medication (See eMAR)           Patient's medical condition is appropriate for Physical Therapy intervention at this time.  Patient is agreeable to participation in the therapy session. Nursing clears patient for therapy.      Objective:  Observation of Patient/Vital Signs:  Vitals:    06/01/19 1545   BP: 112/72   Pulse: 65   Resp: 16   Temp: 97.9 F (36.6 C)   SpO2: 94%         Cognition/Neuro Status  Arousal/Alertness:  Appropriate responses to stimuli  Attention Span: Appears intact  Orientation Level: Oriented X4  Memory: Appears intact  Following Commands: Follows all commands and directions without difficulty  Safety Awareness: independent  Insights: Fully aware of deficits  Problem Solving: Able to problem solve independently  Behavior: attentive;calm;cooperative;crying  Motor Planning: intact  Coordination: intact    Musculoskeletal Examination  Gross ROM  Right Upper Extremity ROM: within functional limits  Left Upper Extremity ROM: within functional limits  Right Lower Extremity ROM: within functional limits  Left Lower Extremity ROM: within functional limits               Functional Mobility  Scooting to EOB: Minimal Assist  Sit to Supine: Minimal Assist  Sit to Stand: Minimal Assist(pt. declines 2/2 pain)  Stand to Sit: Minimal Assist  Transfers  Bed to Chair: Minimal Assist  Device Used for Functional Transfer: front-wheeled walker  Locomotion  Ambulation: (30 ft x 1 with Min A x 2 and FWW)  Pattern: (dec hip/knee flex, dec step length, dec cadence )  Distance Walked (ft) (Step 6,7): 30 Feet(30)  Therapeutic exercises:  Ankle Pumps: 30  Hip abduction/adduction: 2 x 10  Long Arc Quad: 2 x 10  Seated Marches: 2 x 10  Seated heel toe tapps x 30                Educated the patient to role of physical therapy, plan of care, goals of therapy and safety with mobility and ADLs, discharge instructions with verbalized understanding.    Patient left in bed with all medical equipment in place and call bell and all personal items/needs within reach (of note, pt received without alarm in place).  RN notified of session outcome.      Assessment:  Pt requiring Min A with sit to stand and transfers and now ambulating 30 ft with FWW in the room. Pt with noted SOB post exertion. Pt continues to be a high fall risk.  Pt tolerated standing x 3 minutes with F standing balance. Pt required frequent rest b/w activity and noted sleepiness  2/2 taking pain medications, however active participant of skilled PT services. Highly recommend SNF at this time. Pt would benefit from Skilled Physical therapy services to maximize functional potential.            PMP Activity: Step 6 - Walks in Room  Distance Walked (ft) (Step 6,7): 30 Feet(30)    Plan:  Treatment/Interventions: Exercise, Gait training, LE strengthening/ROM, Endurance training, Bed mobility, Continued evaluation      PT Frequency: 2-3x/wk   Continue plan of care.    Goals:  Goals  Goal Formulation: With patient  Time for Goal Acheivement: By time of discharge  Pt Will Go Supine To Sit: modified independent, to maximize functional mobility and independence  Pt Will Perform Sit To Supine: modified independent, to maximize functional mobility and independence  Pt Will Perform Sit to Stand: with supervision, to maximize functional mobility and independence, Partly met  Pt Will Transfer Bed/Chair: with rolling walker, with supervision, to maximize functional mobility and independence  Pt Will Ambulate: 51-100 feet, with rolling walker, with stand by assist, to maximize functional mobility and independence        DME Recommended for Discharge: Front wheel walker(requires cont. assessment)  Discharge Recommendation: SNF(if pain controlled, may progess home w/ HHPT/OT)    Mosie Epstein, PT, MSPT   X [phone]  06/01/2019 5:14 PM

## 2019-06-01 NOTE — Progress Notes (Signed)
[facility] PROGRESS NOTE  Aaron Edelman, x [phone] ([place])      Date Time: 06/01/19 9:25 PM  Patient Name: [name]  Attending Physician: Murlean Hark, MD    CC: follow-up ESRD    Assessment:     1. ESRD, on PD until 5/3 when she was switched to HD  2. Abdominal pain due to PD associated peritonitis - growing Pseudomonas and E. Coli, antibiotics change to zosyn --> po levoquin  3. S/p PD catheter removal on 05/27/19  4. Anemia due to CKD and iron deficiency  5. Secondary hyperparathyroidism     Recommendations:     1. HD yesterday & TTS; upon discharge, plan to continue HD at [facility]   - will plan for additional UF on Monday  2. Follow intraop cultures; s/p PD catheter removal, + Pseudomonas -- previous PD fluid culture grew E. Coli   - recommend to primary team to consider repeating CT of abd/pelvis with IV contrast to evaluate for an abscess and resume IV abx to ensure the infectin is being adequately treated b/c pt is still requesting pain medication  3. Continue curretn BP Rx  4. sevelamer 800mg  tid with meals (phos is well controlled)  5. Aranesp; received IV iron at the end of April  6. Ordered cough suppressant       Case discussed with: pt    Tomaz Janis Oretha Ellis, MD  [facility]  [phone] (office)  X [phone] ([facility] [employer])    Subjective: c/o of abdominal pain after coughing. Pt is asking for morphine for belly pain    Review of Systems:   Review of Systems -  Elevated BP. No CP or SOB    Physical Exam:     Vitals:    06/01/19 1027 06/01/19 1131 06/01/19 1545 06/01/19 1939   BP: 116/70 120/72 112/72 150/63   Pulse:  65 65 71   Resp:  16 16 17    Temp:  97.9 F (36.6 C) 97.9 F (36.6 C) 97.2 F (36.2 C)   TempSrc:  Oral Oral Axillary   SpO2:  96% 94% 98%   Weight:       Height:           Intake and Output Summary (Last 24 hours) at Date Time    Intake/Output Summary (Last 24 hours) at 06/01/2019 2125  Last data filed at 06/01/2019 1530  Gross per 24 hour    Intake 676 ml   Output 130 ml   Net 546 ml       General: awake, alert, oriented x 3, no acute distress.  HEENT: sclera anicteric  Neck: supple  Cardiovascular: regular rate and rhythm  Lungs: clear to auscultation bilaterally, bilateral air entry, normal work of breathing  Abdomen: soft, very tender to light palpation  Extremities: trace edema  Neuro: A+O x 3, no gross motor/sensory deficits    AccessL R HDTC    Meds:      Scheduled Meds: PRN Meds:    acetaminophen, 1,000 mg, Oral, 4 times per day  amLODIPine, 5 mg, Oral, Q12H  atorvastatin, 40 mg, Oral, Daily  carvedilol, 25 mg, Oral, Q12H SCH  darbepoetin alfa, 60 mcg, Subcutaneous, Weekly  docusate sodium, 100 mg, Oral, Daily  gabapentin, 300 mg, Oral, Q8H [place]  [START ON 06/02/2019] heparin (porcine), 5,000 Units, Subcutaneous, Q12H SCH  insulin lispro, 1-8 Units, Subcutaneous, TID AC  lactobacillus/streptococcus, 1 capsule, Oral, Daily  levoFLOXacin, 250 mg, Oral, Q48H  pantoprazole, 40 mg, Oral,  QAM AC  sevelamer, 800 mg, Oral, TID MEALS  vancomycin, 125 mg, Oral, Q6H          Continuous Infusions:   sodium chloride 20 mL/hr at 05/27/19 1100    sodium chloride      lactated ringers      sodium chloride, , PRN  bisacodyl, 10 mg, Daily PRN  dextrose, 15 g of glucose, PRN   And  dextrose, 12.5 g, PRN   And  glucagon (rDNA), 1 mg, PRN  diphenhydrAMINE, 25 mg, Q6H PRN  guaiFENesin-dextromethorphan, 5 mL, Q4H PRN  morphine, 1 mg, Q4H PRN  naloxone, 0.2 mg, PRN  ondansetron, 4 mg, Once PRN  ondansetron, 4 mg, Q6H PRN   Or  ondansetron, 4 mg, Q6H PRN  oxyCODONE, 5 mg, Q6H PRN   Or  oxyCODONE, 10 mg, Q6H PRN  petrolatum, , PRN              Labs:     Recent Labs   Lab 05/30/19  0311 05/29/19  1302 05/28/19  0437   WBC 7.38 2.12* 12.77*   Hgb 7.8* 7.6* 9.9*   Hematocrit 27.2* 26.1* 33.1*   Platelets 125* 116* 149     Recent Labs   Lab 05/30/19  0311 05/29/19  1302 05/28/19  0437   Sodium 139 136 137   Potassium 3.7 4.0 4.7   Chloride 101 102 105   CO2 26 26 20*    BUN 20.0* 29.0* 24.0*   Creatinine 4.3* 5.0* 4.1*   Calcium 7.0* 6.9* 7.1*   Albumin 1.4* 1.4* 1.6*   Glucose 112* 146* 194*   EGFR 12.5 10.5 13.2             Invalid input(s): LEUKOCYTESUR        Imaging personally reviewed, including: No results found.        Signed by: Renard Hamper, MD

## 2019-06-01 NOTE — Plan of Care (Signed)
Problem: Moderate/High Fall Risk Score >5  Goal: Patient will remain free of falls  Outcome: Progressing  Flowsheets  Taken 06/01/2019 1534  VH High Risk (Greater than 13):   Use chair-pad alarm device   Use of floor mat   A CHAIR PAD ALARM WILL BE USED WHEN PATIENT IS UP SITTING IN A CHAIR  Taken 06/01/2019 0752  High (Greater than 13):   HIGH-Consider use of low bed   HIGH-Initiate use of floor mats as appropriate   HIGH-Bed alarm on at all times while patient in bed   HIGH-Apply yellow "Fall Risk" arm band     Problem: Safety  Goal: Patient will be free from injury during hospitalization  Outcome: Progressing  Flowsheets (Taken 05/31/2019 1302 by Darrick Grinder, RN)  Patient will be free from injury during hospitalization:   Assess patient's risk for falls and implement fall prevention plan of care per policy   Provide and maintain safe environment   Use appropriate transfer methods   Ensure appropriate safety devices are available at the bedside   Include patient/ family/ care giver in decisions related to safety   Hourly rounding   Assess for patients risk for elopement and implement Elopement Risk Plan per policy  Goal: Patient will be free from infection during hospitalization  Outcome: Progressing  Flowsheets (Taken 05/28/2019 2302 by Gavin Pound, RN)  Free from Infection during hospitalization:   Assess and monitor for signs and symptoms of infection   Monitor lab/diagnostic results   Monitor all insertion sites (i.e. indwelling lines, tubes, urinary catheters, and drains)   Encourage patient and family to use good hand hygiene technique     Problem: Pain  Goal: Pain at adequate level as identified by patient  Outcome: Progressing  Flowsheets (Taken 06/01/2019 0502 by Trevor Iha, RN)  Pain at adequate level as identified by patient:   Identify patient comfort function goal   Assess pain on admission, during daily assessment and/or before any "as needed" intervention(s)   Reassess pain  within 30-60 minutes of any procedure/intervention, per Pain Assessment, Intervention, Reassessment (AIR) Cycle   Evaluate if patient comfort function goal is met     Problem: Side Effects from Pain Analgesia  Goal: Patient will experience minimal side effects of analgesic therapy  Outcome: Progressing  Flowsheets (Taken 05/28/2019 2302 by Gavin Pound, RN)  Patient will experience minimal side effects of analgesic therapy:   Monitor/assess patient's respiratory status (RR depth, effort, breath sounds)   Assess for changes in cognitive function   Prevent/manage side effects per LIP orders (i.e. nausea, vomiting, pruritus, constipation, urinary retention, etc.)   Evaluate for opioid-induced sedation with appropriate assessment tool (i.e. POSS)     Problem: Discharge Barriers  Goal: Patient will be discharged home or other facility with appropriate resources  Outcome: Progressing     Problem: Psychosocial and Spiritual Needs  Goal: Demonstrates ability to cope with hospitalization/illness  Outcome: Progressing  Flowsheets (Taken 06/01/2019 1534)  Demonstrates ability to cope with hospitalizations/illness:   Encourage verbalization of feelings/concerns/expectations   Provide quiet environment   Reinforce positive adaptation of new coping behaviors   Assist patient to identify own strengths and abilities   Encourage participation in diversional activity     Problem: Altered GI Function  Goal: Fluid and electrolyte balance are achieved/maintained  Outcome: Progressing  Flowsheets (Taken 06/01/2019 1534)  Fluid and electrolyte balance are achieved/maintained:   Monitor intake and output every shift   Provide adequate hydration   Assess and  reassess fluid and electrolyte status   Monitor/assess lab values and report abnormal values   Monitor daily weight   Monitor for muscle weakness   Observe for cardiac arrhythmias  Goal: Elimination patterns are normal or improving  Outcome: Progressing  Flowsheets (Taken 06/01/2019  0503 by Trevor Iha, RN)  Elimination patterns are normal or improving:   Report abnormal assessment to physician   Anticipate/assist with toileting needs   Assess for normal bowel sounds   Monitor for abdominal distension   Monitor for abdominal discomfort  Goal: Nutritional intake is adequate  Outcome: Progressing  Flowsheets (Taken 05/29/2019 1249 by Priscille Heidelberg, RN)  Nutritional intake is adequate:   Monitor daily weights   Allow adequate time for meals   Assist patient with meals/food selection  Goal: Mobility/Activity is maintained at optimal level for patient  Outcome: Progressing  Flowsheets (Taken 05/29/2019 1249 by Priscille Heidelberg, RN)  Mobility/activity is maintained at optimal level for patient:   Increase mobility as tolerated/progressive mobility   Encourage independent activity per ability   Maintain proper body alignment  Goal: No bleeding  Outcome: Progressing     Problem: Compromised Tissue integrity  Goal: Damaged tissue is healing and protected  Outcome: Progressing  Flowsheets (Taken 06/01/2019 1534)  Damaged tissue is healing and protected:   Monitor/assess Braden scale every shift   Reposition patient every 2 hours and as needed unless able to reposition self   Increase activity as tolerated/progressive mobility   Keep intact skin clean and dry  Goal: Nutritional status is improving  Outcome: Progressing  Flowsheets (Taken 05/31/2019 1302 by Darrick Grinder, RN)  Nutritional status is improving:   Assist patient with eating   Allow adequate time for meals   Encourage patient to take dietary supplement(s) as ordered     Problem: Renal Instability  Goal: Fluid and electrolyte balance are achieved/maintained  Outcome: Progressing  Flowsheets (Taken 05/30/2019 0122 by Gavin Pound, RN)  Fluid and electrolyte balance are achieved/maintained:   Monitor intake and output every shift   Monitor/assess lab values and report abnormal values   Provide adequate hydration  Goal: Free from  infection  Outcome: Progressing  Flowsheets (Taken 05/29/2019 1323 by Venetia Constable, RN)  Free from infection: Monitor/assess for signs and symptoms of infection     Problem: Patient Receiving Advanced Renal Therapies  Goal: Therapy access site remains intact  Outcome: Progressing  Flowsheets (Taken 06/01/2019 0503 by Trevor Iha, RN)  Therapy access site remains intact: Assess therapy access site     Pt is A&Ox4. She is intermittently drowsy. Pain meds given to help with sever ab pain. Staples on abdomen are clean, dry and intact. BS monitored. Pt has a good appetite. She sat up in the chair in the middle of the day. Encouraged her to do some walking and she said she would like to do that later. At the moment she is feeling lightheaded. BP monitored. Tele monitor on. Female catheter in place. Pt requests salt and salt limited by staff.

## 2019-06-01 NOTE — Progress Notes (Signed)
HOSPITALIST PROGRESS NOTE      Patient: [name]  Date: 06/01/2019    LOS: 3 Days  Admission Date: 05/27/2019   MRN: [id number]  Attending: Murlean Hark  MD, MPH                    Please contact via [place]: [phone]                    Pager: [phone]     ASSESSMENT/PLAN     ZAYLEY ARRAS is a 64 y.o. female admitted with Generalized abdominal pain    Interval Summary:     Active Hospital Problems    Diagnosis    Thrombocytopenia    Hypotension    Type 2 diabetes mellitus, with long-term current use of insulin    GERD (gastroesophageal reflux disease)    Generalized abdominal pain    ESRD (end stage renal disease) on dialysis    Peritonitis    Peritoneal dialysis catheter dysfunction, initial encounter       Patient Active Hospital Problem List:   Generalized abdominal pain (05/27/2019)   Peritoneal dialysis catheter dysfunction, initial encounter (05/12/2019)   Peritonitis (05/24/2019)    Assessment: PD catheter removed by general surgery with exploratory laparotomy; pleural fluid growing Pseudomonas and E. Coli.  ID has been following.    Plan: Appreciate ID inputs  Continue Zosyn for now  We will switch to oral Cipro prior to discharge to skilled nursing facility   Incentive spirometry use encouraged   Will aim to wean off IV morphine   Additional pain regimen as below.      ESRD (end stage renal disease) on dialysis (05/24/2019)    Assessment: Patient was on PD prior to admission; she has been switched to hemodialysis for now; outpatient HD has been arranged for    Plan: Appreciate nephrology inputs    Outpatient dialysis to continue on a Tuesday Thursday Saturday schedule     Thrombocytopenia (05/31/2019)    Assessment: Could be due to the acute infection as above; platelet counts are stable    Plan: Okay to continue heparin for DVT prophylaxis  Monitor daily CBC     Hypotension (05/31/2019)    Assessment: Likely due to the infection above    Plan: Antibiotics as above      Type 2 diabetes mellitus, with long-term current use of insulin (05/31/2019)    Assessment: Chronic and unknown control    Plan: Continue with insulin as above     GERD (gastroesophageal reflux disease) (05/31/2019)    Assessment: Chronic and unknown control    Plan: c/w home dose of protonix   started maalox     Analgesia: continue Tylenol to 1 g every 6 hours; continue gabapentin 300 mg every 8 hours; continue oxycodone 5 to 10 mg every 6 hours as needed; taper IV morphine    Nutrition: Consistent carb plus renal    GI Prophylaxis: None    DVT Prophylaxis: Heparin    Code Status: DNR/allow natural death    DISPO:  pain control needs to be optimized.  Anticipate 1-2  days before discharge to skilled nursing facility       [place] states that abdominal pain is better today.     Febrile- None   Eating- Well   Coughing-  None   Diarrhea- None   Sleeping - Well   Voiding - Well        MEDICATIONS     Current Facility-Administered Medications   Medication Dose Route Frequency    acetaminophen  1,000 mg Oral 4 times per day    amLODIPine  5 mg Oral Q12H    atorvastatin  40 mg Oral Daily    carvedilol  25 mg Oral Q12H SCH    darbepoetin alfa  60 mcg Subcutaneous Weekly    docusate sodium  100 mg Oral Daily    gabapentin  300 mg Oral Q8H [place]    heparin (porcine)  5,000 Units Subcutaneous Q12H [place]    insulin lispro  1-8 Units Subcutaneous TID AC    lactobacillus/streptococcus  1 capsule Oral Daily    levoFLOXacin  250 mg Oral Q48H    pantoprazole  40 mg Oral QAM AC    sevelamer  800 mg Oral TID MEALS    vancomycin  125 mg Oral Q6H       PHYSICAL EXAM     Vitals:    06/01/19 1939   BP: 150/63   Pulse: 71   Resp: 17   Temp: 97.2 F (36.2 C)   SpO2: 98%       Temperature: Temp  Min: 97.2 F (36.2 C)  Max: 99.9 F (37.7 C)  Pulse: Pulse  Min: 64  Max: 71  Respiratory: Resp  Min: 16  Max: 17  Non-Invasive BP: BP  Min: 105/69  Max: 150/63  Pulse Oximetry SpO2  Min: 94 %  Max: 98 %    Intake  and Output Summary (Last 24 hours) at Date Time    Intake/Output Summary (Last 24 hours) at 06/01/2019 2105  Last data filed at 06/01/2019 1530  Gross per 24 hour   Intake 676 ml   Output 130 ml   Net 546 ml     Physical Exam   Constitutional: She is oriented to person, place, and time. No distress.   In mild distress due to abdominal tenderness during palpation   HENT:   Head: Normocephalic and atraumatic.   Right Ear: External ear normal.   Left Ear: External ear normal.   Mouth/Throat: Oropharynx is clear and moist.   Eyes: Pupils are equal, round, and reactive to light.   Neck: No JVD present. No tracheal deviation present. No thyromegaly present.   Cardiovascular: Normal rate, regular rhythm, normal heart sounds and intact distal pulses. Exam reveals no gallop and no friction rub.   No murmur heard.  Pulmonary/Chest: Effort normal and breath sounds normal. No stridor. No respiratory distress. She has no wheezes. She has no rales. She exhibits no tenderness.   Abdominal: Soft. Bowel sounds are normal. She exhibits no distension and no mass. There is abdominal tenderness. There is no rebound and no guarding.   Musculoskeletal:         General: No tenderness, deformity or edema. Normal range of motion.      Cervical back: Normal range of motion and neck supple.   Lymphadenopathy:     She has no cervical adenopathy.   Neurological: She is alert and oriented to person, place, and time. She has normal reflexes. No cranial nerve deficit. She exhibits normal muscle tone. Gait normal. Coordination normal. GCS score is 15.   Skin: Skin is warm and dry. No rash noted. She is not diaphoretic. No erythema. No pallor.   Temporary dialysis catheter in place on the  right chest wall   Psychiatric: Mood, memory, affect and judgment normal.       LABS     Recent Labs   Lab 05/30/19  0311 05/29/19  1302 05/28/19  0437   WBC 7.38 2.12* 12.77*   RBC 2.76* 2.67* 3.37*   Hgb 7.8* 7.6* 9.9*   Hematocrit 27.2* 26.1* 33.1*   MCV 98.6* 97.8*  98.2*   Platelets 125* 116* 149       Recent Labs   Lab 05/30/19  0311 05/29/19  1302 05/28/19  0437 05/27/19  0507 05/26/19  0315   Sodium 139 136 137 139 141   Potassium 3.7 4.0 4.7 4.1 3.8   Chloride 101 102 105 103 105   CO2 26 26 20* 28 27   BUN 20.0* 29.0* 24.0* 13.0 23.0*   Creatinine 4.3* 5.0* 4.1* 3.0* 4.0*   Glucose 112* 146* 194* 155* 127*   Calcium 7.0* 6.9* 7.1* 7.8* 7.5*       Recent Labs   Lab 05/30/19  0311 05/29/19  1302 05/28/19  0437   ALT <6 <6 <6   AST (SGOT) 10 10 7    Bilirubin, Total 0.3 0.2 0.4   Albumin 1.4* 1.4* 1.6*   Alkaline Phosphatase 99 89 95                   Microbiology Results     Procedure Component Value Units Date/Time    Anaerobic culture [[id number]] Collected: 05/27/19 2343    Specimen: Other from Drainage Updated: 05/31/19 1205    Narrative:      blue swab  ORDER#: [id number]                                    ORDERED BY: Kalman Jewels  SOURCE: Drainage peritoneal fluid                    COLLECTED:  05/27/19 23:43  ANTIBIOTICS AT COLL.:                                RECEIVED :  05/28/19 07:56  ORDER ENTRY COMMENTS:  blue swab  Culture, Anaerobic Bacteria                PRELIM      05/31/19 12:05  05/31/19   No growth to date, final report to follow      Anaerobic culture [[id number]] Collected: 05/27/19 2343    Specimen: Other from Wound Updated: 05/31/19 1205    Narrative:      ORDER#: [id number]                                    ORDERED BY: Kalman Jewels  SOURCE: Wound abdominal wound                        COLLECTED:  05/27/19 23:43  ANTIBIOTICS AT COLL.:                                RECEIVED :  05/28/19 07:56  Culture, Anaerobic Bacteria                PRELIM  05/31/19 12:05  05/31/19   No growth to date, final report to follow      Blood Culture Aerobic/Anaerobic #1 [[id number]] Collected: 05/27/19 0507    Specimen: Arm from Blood, Venipuncture Updated: 05/31/19 1021    Narrative:      ORDER#: [id number]                                    ORDERED BY: CHUNG, SORA   SOURCE: Blood, Venipuncture arm                      COLLECTED:  05/27/19 05:07  ANTIBIOTICS AT COLL.:                                RECEIVED :  05/27/19 09:50  Culture Blood Aerobic and Anaerobic        PRELIM      05/31/19 10:21  05/28/19   No Growth after 1 day/s of incubation.  05/29/19   No Growth after 2 day/s of incubation.  05/30/19   No Growth after 3 day/s of incubation.  05/31/19   No Growth after 4 day/s of incubation.      Blood Culture Aerobic/Anaerobic #2 [[id number]] Collected: 05/27/19 0507    Specimen: Arm from Blood, Venipuncture Updated: 05/31/19 1021    Narrative:      ORDER#: [id number]                                    ORDERED BY: CHUNG, SORA  SOURCE: Blood, Venipuncture arm                      COLLECTED:  05/27/19 05:07  ANTIBIOTICS AT COLL.:                                RECEIVED :  05/27/19 09:50  Culture Blood Aerobic and Anaerobic        PRELIM      05/31/19 10:21  05/28/19   No Growth after 1 day/s of incubation.  05/29/19   No Growth after 2 day/s of incubation.  05/30/19   No Growth after 3 day/s of incubation.  05/31/19   No Growth after 4 day/s of incubation.      COVID-19 (SARS-COV-2) [employer]) [[id number]] Collected: 05/27/19 0624    Specimen: Nasopharyngeal Swab from Nasopharynx Updated: 05/27/19 0656     Purpose of COVID testing Screening     SARS-CoV-2 Specimen Source Nasopharyngeal     SARS CoV 2 Overall Result Negative     Comment: Test performed using the Abbott ID NOW EUA assay.  Please see Fact Sheets for patients and providers located at:  http://[facility].info/  This test is for the qualitative detection of SARS-CoV-2  (COVID19) nucleic acid. Viral nucleic acids may persist in vivo,  independent of viability. Detection of viral nucleic acid does  not imply the presence of infectious virus, or that virus  nucleic acid is the cause of clinical symptoms. Negative  results should be treated as presumptive and, if inconsistent  with clinical signs and  symptoms or necessary for patient  management, should be tested with an alternative molecular  assay. Negative results do not preclude  SARS-CoV-2 infection  and should not be used as the sole basis for patient  management decisions. Invalid results may be due to inhibiting  substances in the specimen and recollection should occur.         Narrative:      o Collect and clearly label specimen type:  o Upper respiratory specimen: One Nasopharyngeal Dry Swab NO  Transport Media.  o Hand deliver to laboratory ASAP  Indication for testing->Extended care facility admission to  semi private room    Culture + Gram Lenise Arena, Body Fluid [[id number]] Collected: 05/27/19 0534    Specimen: Body Fluid from Paracentesis Fluid Updated: 05/31/19 1428    Narrative:      [id number] called Micro Results of Gram Stain. Results read back by: [id number], by  [id number] on 05/27/2019 at 14:31  < 53ml  ORDER#: [id number]                                    ORDERED BY: CHUNG, SORA  SOURCE: Paracentesis Fluid peritoneal dialysis cathetCOLLECTED:  05/27/19 05:34  ANTIBIOTICS AT COLL.:                                RECEIVED :  05/27/19 10:09  ORDER ENTRY COMMENTS:  < 92ml  [id number] called Micro Results of Gram Stain. Results read back by: [id number], by [id number] on 05/27/2019 at 14:31  Stain, Gram                                FINAL       05/27/19 14:31  05/27/19   No Squamous epithelial cells seen             No WBCs seen             Rare Gram positive cocci             Stain performed on Cytospin (concentrated) specimen  Culture and Gram Stain, Aerobic, Body FluidFINAL       05/31/19 14:28  05/30/19   The volume of sample received with this order is less than             the recommended volume. Submission of low volumes may             adversely affect recovery of pathogens and culture results             may be compromised.  05/31/19   No growth      Fungus culture [[id number]] Collected: 05/27/19 0259    Specimen: Other from Drainage Updated: 05/29/19 1220     Narrative:      blue swab  ORDER#: [id number]                                    ORDERED BY: Kalman Jewels  SOURCE: Drainage peritoneal fluid                    COLLECTED:  05/27/19 02:59  ANTIBIOTICS AT COLL.:                                RECEIVED :  05/28/19 07:56  ORDER ENTRY COMMENTS:  blue swab  Stain, Fungal                              FINAL       05/29/19 12:20  05/29/19   No Fungal or Yeast Elements Seen  Culture Fungus                             PENDING      Fungus culture [[id number]] Collected: 05/27/19 2343    Specimen: Other from Wound Updated: 05/29/19 1220    Narrative:      ORDER#: [id number]                                    ORDERED BY: Kalman Jewels  SOURCE: Wound abdominal wound                        COLLECTED:  05/27/19 23:43  ANTIBIOTICS AT COLL.:                                RECEIVED :  05/28/19 07:56  Stain, Fungal                              FINAL       05/29/19 12:20  05/29/19   No Fungal or Yeast Elements Seen  Culture Fungus                             PENDING      Wound culture & gram stain [[id number]] Collected: 05/27/19 2343    Specimen: Wound Updated: 05/30/19 0829    Narrative:      blue swab  ORDER#: [id number]                                    ORDERED BY: Kalman Jewels  SOURCE: Wound peritoneal fluid                       COLLECTED:  05/27/19 23:43  ANTIBIOTICS AT COLL.:                                RECEIVED :  05/28/19 07:56  ORDER ENTRY COMMENTS:  blue swab  Stain, Gram                                FINAL       05/28/19 13:06  05/28/19   Few WBCs             Rare Squamous epithelial cells             No organisms seen  Culture and Gram Stain, Aerobic, Wound     FINAL       05/30/19 08:29   +  05/29/19   Light growth of Pseudomonas aeruginosa               Refer to susceptibilities on culture #[id number]  Wound culture & gram stain [[id number]] Collected: 05/27/19 0221    Specimen: Wound Updated: 05/30/19 0828    Narrative:      ORDER#: [id number]                                     ORDERED BY: Kalman Jewels  SOURCE: Wound abdominal wound                        COLLECTED:  05/27/19 02:21  ANTIBIOTICS AT COLL.:                                RECEIVED :  05/28/19 07:56  Stain, Gram                                FINAL       05/28/19 13:03  05/28/19   Moderate WBCs             Few Squamous epithelial cells             No organisms seen  Culture and Gram Stain, Aerobic, Wound     FINAL       05/30/19 08:28   +  05/30/19   Light growth of Pseudomonas aeruginosa      _____________________________________________________________________________                                  P.aeruginosa    ANTIBIOTICS                     MIC  INTRP      _____________________________________________________________________________  Amikacin                        <=8    S        Aztreonam                        8     S        Cefepime                         2     S        Ceftazidime                     <=2    S        Ciprofloxacin                 <=0.25   S        Gentamicin                      <=2    S        Levofloxacin                   <=0.5   S        Meropenem                      <=0.5   S        Piperacillin/Tazobactam  4/4    S        Tobramycin                      <=2    S        _____________________________________________________________________________            S=SUSCEPTIBLE     I=INTERMEDIATE     R=RESISTANT                            N/S=NON-SUSCEPTIBLE  _____________________________________________________________________________             CT Abd/Pelvis with IV Contrast only    Result Date: 05/27/2019  1. Fluid-filled mildly distended loops of small bowel with distal decompressed small bowel loops. Findings may represent early or partial small bowel obstruction. Enteritis is felt to be less likely. 2. Trace bilateral pleural effusions. 3. Additional chronic findings as above. Delaney Meigs, MD  05/27/2019 6:07 AM      Echo Results     None          I have personally reviewed  the patient's labs, medications, and imaging    Total visit time = 35 mins ; more than 50% spent counseling/coordinating care    Signed,    Murlean Hark MD, MPH  9:05 PM 06/01/2019   Please contact via [place]: [phone]  Pager: [phone]       This chart was generated using a [place] which does not employ spell-checking or grammar-checking features. It was dictated, all or in part, in a busy and often noisy patient care environment. I have taken all usual measures to dictate carefully and to review all aspects of this chart. Nonetheless, given the known and well-documented performance characteristics of voice recognition software in such patient care environments, this dictation still may contain unrecognized and wholly unintended errors or omissions.

## 2019-06-01 NOTE — Plan of Care (Signed)
Problem: Moderate/High Fall Risk Score >5  Goal: Patient will remain free of falls  Outcome: Progressing  Flowsheets (Taken 05/31/2019 2047)  High (Greater than 13):   HIGH-Visual cue at entrance to patient's room   HIGH-Bed alarm on at all times while patient in bed   HIGH-Apply yellow "Fall Risk" arm band   HIGH-Initiate use of floor mats as appropriate     Problem: Pain  Goal: Pain at adequate level as identified by patient  Outcome: Progressing  Flowsheets (Taken 06/01/2019 0502)  Pain at adequate level as identified by patient:   Identify patient comfort function goal   Assess pain on admission, during daily assessment and/or before any "as needed" intervention(s)   Reassess pain within 30-60 minutes of any procedure/intervention, per Pain Assessment, Intervention, Reassessment (AIR) Cycle   Evaluate if patient comfort function goal is met     Problem: Altered GI Function  Goal: Elimination patterns are normal or improving  Outcome: Progressing  Flowsheets (Taken 06/01/2019 0503)  Elimination patterns are normal or improving:   Report abnormal assessment to physician   Anticipate/assist with toileting needs   Assess for normal bowel sounds   Monitor for abdominal distension   Monitor for abdominal discomfort     Problem: Compromised Tissue integrity  Goal: Damaged tissue is healing and protected  Outcome: Progressing  Flowsheets (Taken 06/01/2019 0503)  Damaged tissue is healing and protected:   Monitor/assess Braden scale every shift   Keep intact skin clean and dry     Problem: Patient Receiving Advanced Renal Therapies  Goal: Therapy access site remains intact  Outcome: Progressing  Flowsheets (Taken 06/01/2019 0503)  Therapy access site remains intact: Assess therapy access site     Pt admitted for abdominal pain following d/c for same issue. Pt is A&Ox4, but has periods of drowsiness. 24h tele continues, NSR. Bedtime BG 95. External catheter in place, low dark output, HD pt T/T/S. Abdominal staples from previous  surgery secured and clean. Both PO and IV abx given. Pt reports 8/10 abdominal pain, relieved by scheduled and prn Tylenol.

## 2019-06-02 DIAGNOSIS — R159 Full incontinence of feces: Secondary | ICD-10-CM | POA: Diagnosis present

## 2019-06-02 LAB — CBC
Absolute NRBC: 0 10*3/uL (ref 0.00–0.00)
Hematocrit: 25.3 % — ABNORMAL LOW (ref 34.7–43.7)
Hgb: 7.2 g/dL — ABNORMAL LOW (ref 11.4–14.8)
MCH: 28.5 pg (ref 25.1–33.5)
MCHC: 28.5 g/dL — ABNORMAL LOW (ref 31.5–35.8)
MCV: 100 fL — ABNORMAL HIGH (ref 78.0–96.0)
MPV: 11.7 fL (ref 8.9–12.5)
Nucleated RBC: 0 /100 WBC (ref 0.0–0.0)
Platelets: 201 10*3/uL (ref 142–346)
RBC: 2.53 10*6/uL — ABNORMAL LOW (ref 3.90–5.10)
RDW: 13 % (ref 11–15)
WBC: 9.29 10*3/uL (ref 3.10–9.50)

## 2019-06-02 LAB — BASIC METABOLIC PANEL
Anion Gap: 11 (ref 5.0–15.0)
BUN: 28 mg/dL — ABNORMAL HIGH (ref 7.0–19.0)
CO2: 23 mEq/L (ref 22–29)
Calcium: 7 mg/dL — ABNORMAL LOW (ref 8.5–10.5)
Chloride: 103 mEq/L (ref 100–111)
Creatinine: 5.8 mg/dL — ABNORMAL HIGH (ref 0.6–1.0)
Glucose: 174 mg/dL — ABNORMAL HIGH (ref 70–100)
Potassium: 4.8 mEq/L (ref 3.5–5.1)
Sodium: 137 mEq/L (ref 136–145)

## 2019-06-02 LAB — HEMOLYSIS INDEX: Hemolysis Index: 1 (ref 0–18)

## 2019-06-02 LAB — GFR: EGFR: 8.9

## 2019-06-02 LAB — GLUCOSE WHOLE BLOOD - POCT
Whole Blood Glucose POCT: 155 mg/dL — ABNORMAL HIGH (ref 70–100)
Whole Blood Glucose POCT: 149 mg/dL — ABNORMAL HIGH (ref 70–100)
Whole Blood Glucose POCT: 233 mg/dL — ABNORMAL HIGH (ref 70–100)
Whole Blood Glucose POCT: 173 mg/dL — ABNORMAL HIGH (ref 70–100)

## 2019-06-02 MED ORDER — GABAPENTIN 300 MG PO CAPS
300.00 mg | ORAL_CAPSULE | Freq: Every day | ORAL | Status: DC
Start: 2019-06-03 — End: 2019-06-13
  Administered 2019-06-03 – 2019-06-13 (×11): 300 mg via ORAL
  Filled 2019-06-02 (×11): qty 1

## 2019-06-02 MED ORDER — IHS HOLD MEDICATION FOR PROCEDURE
Status: DC | PRN
Start: 2019-06-02 — End: 2019-06-02
  Filled 2019-06-02: qty 1

## 2019-06-02 MED ORDER — SODIUM CHLORIDE 0.9 % IV BOLUS
100.0000 mL | INTRAVENOUS | Status: AC | PRN
Start: 2019-06-02 — End: 2019-06-02

## 2019-06-02 MED ORDER — SODIUM CHLORIDE 0.9 % IV BOLUS
250.0000 mL | INTRAVENOUS | Status: AC | PRN
Start: 2019-06-02 — End: 2019-06-02

## 2019-06-02 MED ORDER — HEPARIN SODIUM (PORCINE) 5000 UNIT/ML IJ SOLN
5000.00 [IU] | Freq: Two times a day (BID) | INTRAMUSCULAR | Status: DC
Start: 2019-06-03 — End: 2019-06-13
  Administered 2019-06-03 – 2019-06-13 (×21): 5000 [IU] via SUBCUTANEOUS
  Filled 2019-06-02 (×21): qty 1

## 2019-06-02 MED ORDER — ALBUMIN HUMAN 25 % IV SOLN
100.0000 mL | INTRAVENOUS | Status: AC | PRN
Start: 2019-06-02 — End: 2019-06-02

## 2019-06-02 NOTE — Progress Notes (Signed)
INFECTIOUS DISEASES PROGRESS NOTE    Date Time: 06/02/19 11:19 AM  Patient Name: [name],[name]  Patient status.Inpatient  Hospital Day: 4    Assessment and Plan:   1.  PD catheter peritonitis/Pseudomonas          Plan: 1. PO Levaquin  2.  Continue p.o. vancomycin    Subjective:   Nontoxic    Review of Systems:   Review of Systems -abdominal pain     Antibiotics:   Day 6/10    Other medications reviewed in EPIC.  Central Access:   New dialysis access day 4    Physical Exam:     Vitals:    06/02/19 1116   BP: 100/65   Pulse: 68   Resp: 16   Temp: 97.9 F (36.6 C)   SpO2: (!) 88%       Lungs: Clear  Heart: Regular rhythm  Abdomen: Bowel sounds heard.  Wound healed.  Decreasing tenderness to palpation    Labs:     Microbiology Results     Procedure Component Value Units Date/Time    Blood Culture Aerobic/Anaerobic #1 [[id number]] Collected: 05/27/19 0507    Specimen: Arm from Blood, Venipuncture Updated: 05/27/19 0950    Narrative:      1 BLUE+1 PURPLE    Blood Culture Aerobic/Anaerobic #2 [[id number]] Collected: 05/27/19 0507    Specimen: Arm from Blood, Venipuncture Updated: 05/27/19 0950    Narrative:      1 BLUE+1 PURPLE    COVID-19 (SARS-COV-2) [employer]) [[id number]] Collected: 05/27/19 0624    Specimen: Nasopharyngeal Swab from Nasopharynx Updated: 05/27/19 0656     Purpose of COVID testing Screening     SARS-CoV-2 Specimen Source Nasopharyngeal     SARS CoV 2 Overall Result Negative     Comment: Test performed using the Abbott ID NOW EUA assay.  Please see Fact Sheets for patients and providers located at:  http://olson-hall.info/  This test is for the qualitative detection of SARS-CoV-2  (COVID19) nucleic acid. Viral nucleic acids may persist in vivo,  independent of viability. Detection of viral nucleic acid does  not imply the presence of infectious virus, or that virus  nucleic acid is the cause of clinical symptoms. Negative  results should be treated as presumptive and, if  inconsistent  with clinical signs and symptoms or necessary for patient  management, should be tested with an alternative molecular  assay. Negative results do not preclude SARS-CoV-2 infection  and should not be used as the sole basis for patient  management decisions. Invalid results may be due to inhibiting  substances in the specimen and recollection should occur.         Narrative:      o Collect and clearly label specimen type:  o Upper respiratory specimen: One Nasopharyngeal Dry Swab NO  Transport Media.  o Hand deliver to laboratory ASAP  Indication for testing->Extended care facility admission to  semi private room    Culture + Gram Lenise Arena, Body Fluid [[id number]] Collected: 05/27/19 0534    Specimen: Body Fluid from Paracentesis Fluid Updated: 05/27/19 1009          CBC w/Diff CMP   Recent Labs   Lab 06/02/19  0345 05/30/19  0311 05/29/19  1302 05/28/19  0437 05/27/19  0507   WBC 9.29 7.38 2.12*  More results in Results Review 15.22*   Hgb 7.2* 7.8* 7.6*  More results in Results Review 10.4*   Hematocrit 25.3* 27.2* 26.1*  More results in  Results Review 34.1*   Platelets 201 125* 116*  More results in Results Review 165   MCV 100.0* 98.6* 97.8*  More results in Results Review 96.3*   Neutrophils  --   --   --   --  86.8   More results in Results Review = values in this interval not displayed.       PT/INR         Recent Labs   Lab 06/02/19  0345 05/30/19  0311 05/29/19  1302 05/28/19  0437 05/28/19  0437   Sodium 137 139 136  More results in Results Review 137   Potassium 4.8 3.7 4.0  More results in Results Review 4.7   Chloride 103 101 102  More results in Results Review 105   CO2 23 26 26   More results in Results Review 20*   BUN 28.0* 20.0* 29.0*  More results in Results Review 24.0*   Creatinine 5.8* 4.3* 5.0*  More results in Results Review 4.1*   Glucose 174* 112* 146*  More results in Results Review 194*   Calcium 7.0* 7.0* 6.9*  More results in Results Review 7.1*   Protein, Total  --  4.7*  4.2*  --  4.4*   Albumin  --  1.4* 1.4*  --  1.6*   AST (SGOT)  --  10 10  --  7   ALT  --  <6 <6  --  <6   Alkaline Phosphatase  --  99 89  --  95   Bilirubin, Total  --  0.3 0.2  --  0.4   More results in Results Review = values in this interval not displayed.      Glucose POCT   Recent Labs   Lab 06/02/19  0345 05/30/19  0311 05/29/19  1302 05/28/19  0437 05/27/19  0507   Glucose 174* 112* 146* 194* 155*          Rads:     Radiology Results (24 Hour)     ** No results found for the last 24 hours. **            Signed by: Ernst Breach

## 2019-06-02 NOTE — Progress Notes (Signed)
[facility] PROGRESS NOTE  Aaron Edelman, x [phone] (Louise)      Date Time: 06/02/19 12:52 PM  Patient Name: [name]  Attending Physician: Murlean Hark, MD    CC: follow-up ESRD    Assessment:     1. ESRD, on PD until 5/3 when she was switched to HD  2. Abdominal pain due to PD associated peritonitis - growing Pseudomonas and E. Coli, antibiotics change to zosyn --> po levoquin  - S/p PD catheter removal on 05/27/19  3. 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 Secondary hyperparathyroidism     Recommendations:   1. Isolated UF today  2. HD TTS, [facility] HD [facility]  3. Decrease Gabapentin to 300 mg daily given renal failure    Stable from renal standpoint  - plan for SNF      Case discussed with: pt    Corky Mull, MD  [facility]  [phone] (office)  X [phone] [facility])    Subjective: pain improved    Review of Systems:   Review of Systems -  No cp/sob  +    Physical Exam:     Vitals:    06/02/19 0723 06/02/19 0902 06/02/19 1116 06/02/19 1203   BP: 120/71 111/69 100/65 99/64   Pulse: 67 67 68    Resp: 16  16    Temp: 98.4 F (36.9 C)  97.9 F (36.6 C)    TempSrc: Oral  Oral    SpO2: 95%  (!) 88%    Weight:       Height:           Intake and Output Summary (Last 24 hours) at Date Time    Intake/Output Summary (Last 24 hours) at 06/02/2019 1252  Last data filed at 06/02/2019 0600  Gross per 24 hour   Intake 678 ml   Output 50 ml   Net 628 ml       General: awake, alert, oriented x 3, no acute distress.  HEENT: sclera anicteric  Neck: supple  Cardiovascular: regular rate and rhythm  Lungs: clear to auscultation bilaterally, bilateral air entry, normal work of breathing  Abdomen: soft, very tender to light palpation  Extremities: trace edema  Neuro: A+O x 3, no gross motor/sensory deficits    AccessL R HDTC    Meds:      Scheduled Meds: PRN Meds:    acetaminophen, 1,000 mg, Oral, 4 times per day  amLODIPine, 5 mg, Oral, Q12H  atorvastatin, 40  mg, Oral, Daily  carvedilol, 25 mg, Oral, Q12H [facility]  darbepoetin alfa, 60 mcg, Subcutaneous, Weekly  docusate sodium, 100 mg, Oral, Daily  gabapentin, 300 mg, Oral, Q8H [place]  [START ON 06/03/2019] heparin (porcine), 5,000 Units, Subcutaneous, Q12H [place]  insulin lispro, 1-8 Units, Subcutaneous, TID AC  lactobacillus/streptococcus, 1 capsule, Oral, Daily  levoFLOXacin, 250 mg, Oral, Q48H  pantoprazole, 40 mg, Oral, QAM AC  sevelamer, 800 mg, Oral, TID MEALS  vancomycin, 125 mg, Oral, Q6H          Continuous Infusions:   sodium chloride 20 mL/hr at 05/27/19 1100    sodium chloride      lactated ringers      sodium chloride, , PRN  albumin human, 100 mL, PRN  bisacodyl, 10 mg, Daily PRN  dextrose, 15 g of glucose, PRN   And  dextrose, 12.5 g, PRN   And  glucagon (rDNA), 1 mg, PRN  diphenhydrAMINE, 25 mg, Q6H  PRN  guaiFENesin-dextromethorphan, 5 mL, Q4H PRN  morphine, 1 mg, Q4H PRN  naloxone, 0.2 mg, PRN  ondansetron, 4 mg, Once PRN  ondansetron, 4 mg, Q6H PRN   Or  ondansetron, 4 mg, Q6H PRN  oxyCODONE, 5 mg, Q6H PRN   Or  oxyCODONE, 10 mg, Q6H PRN  petrolatum, , PRN  sodium chloride, 100 mL, Q1H PRN  sodium chloride, 250 mL, PRN              Labs:     Recent Labs   Lab 06/02/19  0345 05/30/19  0311 05/29/19  1302   WBC 9.29 7.38 2.12*   Hgb 7.2* 7.8* 7.6*   Hematocrit 25.3* 27.2* 26.1*   Platelets 201 125* 116*     Recent Labs   Lab 06/02/19  0345 05/30/19  0311 05/29/19  1302 05/28/19  0437 05/28/19  0437   Sodium 137 139 136  More results in Results Review 137   Potassium 4.8 3.7 4.0  More results in Results Review 4.7   Chloride 103 101 102  More results in Results Review 105   CO2 23 26 26   More results in Results Review 20*   BUN 28.0* 20.0* 29.0*  More results in Results Review 24.0*   Creatinine 5.8* 4.3* 5.0*  More results in Results Review 4.1*   Calcium 7.0* 7.0* 6.9*  More results in Results Review 7.1*   Albumin  --  1.4* 1.4*  --  1.6*   Glucose 174* 112* 146*  More results in Results Review 194*   EGFR 8.9  12.5 10.5  More results in Results Review 13.2   More results in Results Review = values in this interval not displayed.             Invalid input(s): LEUKOCYTESUR        Imaging personally reviewed, including: No results found.        Signed by: Corky Mull, MD

## 2019-06-02 NOTE — Progress Notes (Signed)
POST OPERATIVE PROGRESS NOTE  [facility]    Date Time: 06/02/19 3:41 PM  Patient Name: [name],[name]      ASSESSMENT:   6 Days Post-Op S/P Procedure(s):  REMOVAL, PERITONEAL DIALYSIS CATHETER  EXPLORATORY LAPAROTOMY    Doing well, tolerating diet  Ongoing diaylsis    PLAN:   ID for antibiotics, infection of PD cath  Staple removal at POD 14  Discharge per primary        SUBJECTIVE:   The patient is doing fairly well.  Post operative pain is well controlled with medications.  Current dietary status:  Glucerna Supplement Quantity: A. One; Flavor: Strawberry; Frequency: Daily with lunch  Diet consistent carbohydrate and renal Protein restriction: 80 GM Protein and tolerating fairly well.  Flatus: yes. BM:  no.  Additional complaints: none       OBJECTIVE:   Current Vitals:   Vitals:    06/02/19 1203   BP: 99/64   Pulse:    Resp:    Temp:    SpO2:        Intake and Output Summary (Last 24 hours):  I/O last 3 completed shifts:  In: 1236 [P.O.:1176; I.V.:60]  Out: 180 [Urine:180]    Labs:     Results     Procedure Component Value Units Date/Time    Glucose Whole Blood - POCT [[id number]]  (Abnormal) Collected: 06/02/19 1118     Updated: 06/02/19 1155     Whole Blood Glucose POCT 149 mg/dL     Anaerobic culture [[id number]] Collected: 05/27/19 2343    Specimen: Other from Wound Updated: 06/02/19 0822    Narrative:      ORDER#: [id number]                                    ORDERED BY: Kalman Jewels  SOURCE: Wound abdominal wound                        COLLECTED:  05/27/19 23:43  ANTIBIOTICS AT COLL.:                                RECEIVED :  05/28/19 07:56  Culture, Anaerobic Bacteria                FINAL       06/02/19 08:22  06/02/19   No anaerobic growth      Anaerobic culture [[id number]] Collected: 05/27/19 2343    Specimen: Other from Drainage Updated: 06/02/19 0973    Narrative:      blue swab  ORDER#: [id number]                                    ORDERED BY: Kalman Jewels  SOURCE: Drainage  peritoneal fluid                    COLLECTED:  05/27/19 23:43  ANTIBIOTICS AT COLL.:                                RECEIVED :  05/28/19 07:56  ORDER ENTRY COMMENTS:  blue swab  Culture, Anaerobic Bacteria  FINAL       06/02/19 08:22  06/02/19   No anaerobic growth      Glucose Whole Blood - POCT [[id number]]  (Abnormal) Collected: 06/02/19 0728     Updated: 06/02/19 0759     Whole Blood Glucose POCT 155 mg/dL     Basic Metabolic Panel [[id number]]  (Abnormal) Collected: 06/02/19 0345    Specimen: Blood Updated: 06/02/19 0439     Glucose 174 mg/dL      BUN 28.0 mg/dL      Creatinine 5.8 mg/dL      Calcium 7.0 mg/dL      Sodium 137 mEq/Young      Potassium 4.8 mEq/Young      Chloride 103 mEq/Young      CO2 23 mEq/Young      Anion Gap 11.0    Hemolysis index [[id number]] Collected: 06/02/19 0345     Updated: 06/02/19 0439     Hemolysis Index 1    GFR [[id number]] Collected: 06/02/19 0345     Updated: 06/02/19 0439     EGFR 8.9    CBC without differential [[id number]]  (Abnormal) Collected: 06/02/19 0345    Specimen: Blood Updated: 06/02/19 0419     WBC 9.29 x10 3/uL      Hgb 7.2 g/dL      Hematocrit 25.3 %      Platelets 201 x10 3/uL      RBC 2.53 x10 6/uL      MCV 100.0 fL      MCH 28.5 pg      MCHC 28.5 g/dL      RDW 13 %      MPV 11.7 fL      Nucleated RBC 0.0 /100 WBC      Absolute NRBC 0.00 x10 3/uL     Glucose Whole Blood - POCT [[id number]]  (Abnormal) Collected: 06/01/19 2129     Updated: 06/01/19 2139     Whole Blood Glucose POCT 194 mg/dL           Rads:     Radiology Results (24 Hour)     ** No results found for the last 24 hours. **          Physical Exam:     Mental status - alert, oriented to person, place, and time, normal mood, behavior, speech, dress, motor activity, and thought processes  Chest - clear to auscultation, no wheezes, rales or rhonchi, symmetric air entry, no tachypnea, retractions or cyanosis  Heart - normal rate, regular rhythm, normal S1, S2, no murmurs, rubs, clicks or gallops  Abdomen -  tenderness noted at incision. Staples intact  Wound - clean, dry, no drainage  Extremities - peripheral pulses normal, no pedal edema, no clubbing or cyanosis      Signed by: Iran Sizer

## 2019-06-02 NOTE — Progress Notes (Signed)
HOSPITALIST PROGRESS NOTE      Patient: [name]  Date: 06/02/2019    LOS: 4 Days  Admission Date: 05/27/2019   MRN: [id number]  Attending: Murlean Hark  MD, MPH                    Please contact via [place]: [phone]                    Pager: [phone]     ASSESSMENT/PLAN     [name] is a 64 y.o. female admitted with Generalized abdominal pain    Interval Summary:     Active Hospital Problems    Diagnosis    Bowel incontinence    Thrombocytopenia    Hypotension    Type 2 diabetes mellitus, with long-term current use of insulin    GERD (gastroesophageal reflux disease)    Generalized abdominal pain    ESRD (end stage renal disease) on dialysis    Peritonitis    Peritoneal dialysis catheter dysfunction, initial encounter       Patient Active Hospital Problem List:   Generalized abdominal pain (05/27/2019)   Peritoneal dialysis catheter dysfunction, initial encounter (05/12/2019)   Peritonitis (05/24/2019)    Assessment: PD catheter removed by general surgery with exploratory laparotomy on 05/28/2019. Pleural fluid growing Pseudomonas and E. Coli.  ID has been following.    Plan: Appreciate ID inputs  Switched from Zosyn to oral Cipro prior to discharge to skilled nursing facility   Oral vancomycin 125 mg every 6 hours has been continued on account of loose stools  incentive spirometry use encouraged   Stopped IV morphine  Additional pain regimen as below.     Bowel incontinence   Assessment-unclear if this is a complication of prior back injury; however this problem is chronic and has been going on for the past year  Plan-minimize risk for bedsores with frequent turning and quick response to any accidents     ESRD (end stage renal disease) on dialysis (05/24/2019)    Assessment: Patient was on PD prior to admission; she has been switched to hemodialysis for now; outpatient HD has been arranged for    Plan: Appreciate nephrology inputs    Outpatient dialysis to continue  on a Tuesday Thursday Saturday schedule     Thrombocytopenia (05/31/2019)    Assessment: Could be due to the acute infection as above; platelet counts are stable    Plan: Okay to continue heparin for DVT prophylaxis  Monitor daily CBC     Hypotension (05/31/2019)    Assessment: Likely due to the infection above    Plan: Antibiotics as above     Type 2 diabetes mellitus, with long-term current use of insulin (05/31/2019)    Assessment: Chronic and unknown control    Plan: Continue with insulin as above     GERD (gastroesophageal reflux disease) (05/31/2019)    Assessment: Chronic and unknown control    Plan: c/w home dose of protonix   started maalox       Analgesia: continue Tylenol 1 g every 6 hours; reduced dose of gabapentin to 300 mg every night; continue oxycodone 5 to 10 mg every 6 hours as needed; stopped IV morphine    Nutrition: Consistent carb plus renal    GI Prophylaxis: None    DVT Prophylaxis: Heparin  Code Status: DNR/allow natural death    DISPO: Anticipate discharge to skilled nursing facility on 5/11.       [place] states that abdominal pain is better today.  She reports that she was able to sit up on the chair all day and also able to walk to the bathroom unassisted.    Febrile- None   Eating- Well   Coughing- None   Diarrhea- None   Sleeping - Well   Voiding - Well        MEDICATIONS     Current Facility-Administered Medications   Medication Dose Route Frequency    acetaminophen  1,000 mg Oral 4 times per day    amLODIPine  5 mg Oral Q12H    atorvastatin  40 mg Oral Daily    carvedilol  25 mg Oral Q12H [place]    darbepoetin alfa  60 mcg Subcutaneous Weekly    docusate sodium  100 mg Oral Daily    [START ON 06/03/2019] gabapentin  300 mg Oral Daily    [START ON 06/03/2019] heparin (porcine)  5,000 Units Subcutaneous Q12H [facility]    insulin lispro  1-8 Units Subcutaneous TID AC    lactobacillus/streptococcus  1 capsule Oral Daily    levoFLOXacin  250 mg Oral Q48H     pantoprazole  40 mg Oral QAM AC    sevelamer  800 mg Oral TID MEALS    vancomycin  125 mg Oral Q6H       PHYSICAL EXAM     Vitals:    06/02/19 2010   BP: 109/68   Pulse: 64   Resp: 17   Temp: 97.5 F (36.4 C)   SpO2: 98%       Temperature: Temp  Min: 97.5 F (36.4 C)  Max: 98.6 F (37 C)  Pulse: Pulse  Min: 62  Max: 71  Respiratory: Resp  Min: 16  Max: 18  Non-Invasive BP: BP  Min: 99/64  Max: 139/64  Pulse Oximetry SpO2  Min: 88 %  Max: 100 %    Intake and Output Summary (Last 24 hours) at Date Time    Intake/Output Summary (Last 24 hours) at 06/02/2019 2206  Last data filed at 06/02/2019 2000  Gross per 24 hour   Intake 1180 ml   Output 50 ml   Net 1130 ml     Physical Exam   Constitutional: She is oriented to person, place, and time. No distress.   In mild distress due to abdominal tenderness during palpation   HENT:   Head: Normocephalic and atraumatic.   Right Ear: External ear normal.   Left Ear: External ear normal.   Mouth/Throat: Oropharynx is clear and moist.   Eyes: Pupils are equal, round, and reactive to light.   Neck: No JVD present. No tracheal deviation present. No thyromegaly present.   Cardiovascular: Normal rate, regular rhythm, normal heart sounds and intact distal pulses. Exam reveals no gallop and no friction rub.   No murmur heard.  Pulmonary/Chest: Effort normal and breath sounds normal. No stridor. No respiratory distress. She has no wheezes. She has no rales. She exhibits no tenderness.   Abdominal: Soft. Bowel sounds are normal. She exhibits no distension and no mass. There is abdominal tenderness. There is no rebound and no guarding.   Musculoskeletal:         General: No tenderness, deformity or edema. Normal range of motion.      Cervical back: Normal range  of motion and neck supple.   Lymphadenopathy:     She has no cervical adenopathy.   Neurological: She is alert and oriented to person, place, and time. She has normal reflexes. No cranial nerve deficit. She exhibits normal muscle  tone. Gait normal. Coordination normal. GCS score is 15.   Skin: Skin is warm and dry. No rash noted. She is not diaphoretic. No erythema. No pallor.   Tunneled dialysis catheter in place on the right chest wall   Psychiatric: Mood, memory, affect and judgment normal.       LABS     Recent Labs   Lab 06/02/19  0345 05/30/19  0311 05/29/19  1302   WBC 9.29 7.38 2.12*   RBC 2.53* 2.76* 2.67*   Hgb 7.2* 7.8* 7.6*   Hematocrit 25.3* 27.2* 26.1*   MCV 100.0* 98.6* 97.8*   Platelets 201 125* 116*       Recent Labs   Lab 06/02/19  0345 05/30/19  0311 05/29/19  1302 05/28/19  0437 05/27/19  0507   Sodium 137 139 136 137 139   Potassium 4.8 3.7 4.0 4.7 4.1   Chloride 103 101 102 105 103   CO2 23 26 26  20* 28   BUN 28.0* 20.0* 29.0* 24.0* 13.0   Creatinine 5.8* 4.3* 5.0* 4.1* 3.0*   Glucose 174* 112* 146* 194* 155*   Calcium 7.0* 7.0* 6.9* 7.1* 7.8*       Recent Labs   Lab 05/30/19  0311 05/29/19  1302 05/28/19  0437   ALT <6 <6 <6   AST (SGOT) 10 10 7    Bilirubin, Total 0.3 0.2 0.4   Albumin 1.4* 1.4* 1.6*   Alkaline Phosphatase 99 89 95                   Microbiology Results     Procedure Component Value Units Date/Time    Anaerobic culture [[id number]] Collected: 05/27/19 2343    Specimen: Other from Drainage Updated: 05/31/19 1205    Narrative:      blue swab  ORDER#: [id number]                                    ORDERED BY: Kalman Jewels  SOURCE: Drainage peritoneal fluid                    COLLECTED:  05/27/19 23:43  ANTIBIOTICS AT COLL.:                                RECEIVED :  05/28/19 07:56  ORDER ENTRY COMMENTS:  blue swab  Culture, Anaerobic Bacteria                PRELIM      05/31/19 12:05  05/31/19   No growth to date, final report to follow      Anaerobic culture [[id number]] Collected: 05/27/19 2343    Specimen: Other from Wound Updated: 05/31/19 1205    Narrative:      ORDER#: [id number]                                    ORDERED BY: Kalman Jewels  SOURCE: Wound abdominal wound  COLLECTED:  05/27/19 23:43  ANTIBIOTICS AT COLL.:                                RECEIVED :  05/28/19 07:56  Culture, Anaerobic Bacteria                PRELIM      05/31/19 12:05  05/31/19   No growth to date, final report to follow      Blood Culture Aerobic/Anaerobic #1 [[id number]] Collected: 05/27/19 0507    Specimen: Arm from Blood, Venipuncture Updated: 05/31/19 1021    Narrative:      ORDER#: [id number]                                    ORDERED BY: CHUNG, SORA  SOURCE: Blood, Venipuncture arm                      COLLECTED:  05/27/19 05:07  ANTIBIOTICS AT COLL.:                                RECEIVED :  05/27/19 09:50  Culture Blood Aerobic and Anaerobic        PRELIM      05/31/19 10:21  05/28/19   No Growth after 1 day/s of incubation.  05/29/19   No Growth after 2 day/s of incubation.  05/30/19   No Growth after 3 day/s of incubation.  05/31/19   No Growth after 4 day/s of incubation.      Blood Culture Aerobic/Anaerobic #2 [[id number]] Collected: 05/27/19 0507    Specimen: Arm from Blood, Venipuncture Updated: 05/31/19 1021    Narrative:      ORDER#: [id number]                                    ORDERED BY: CHUNG, SORA  SOURCE: Blood, Venipuncture arm                      COLLECTED:  05/27/19 05:07  ANTIBIOTICS AT COLL.:                                RECEIVED :  05/27/19 09:50  Culture Blood Aerobic and Anaerobic        PRELIM      05/31/19 10:21  05/28/19   No Growth after 1 day/s of incubation.  05/29/19   No Growth after 2 day/s of incubation.  05/30/19   No Growth after 3 day/s of incubation.  05/31/19   No Growth after 4 day/s of incubation.      COVID-19 (SARS-COV-2) [employer] [facility]) [[id number]] Collected: 05/27/19 0624    Specimen: Nasopharyngeal Swab from Nasopharynx Updated: 05/27/19 0656     Purpose of COVID testing Screening     SARS-CoV-2 Specimen Source Nasopharyngeal     SARS CoV 2 Overall Result Negative     Comment: Test performed using the Abbott ID NOW EUA assay.  Please see Fact Sheets for  patients and providers located at:  http://[facility].info/  This test is for the qualitative detection of SARS-CoV-2  (COVID19) nucleic acid. Viral nucleic acids may persist in  vivo,  independent of viability. Detection of viral nucleic acid does  not imply the presence of infectious virus, or that virus  nucleic acid is the cause of clinical symptoms. Negative  results should be treated as presumptive and, if inconsistent  with clinical signs and symptoms or necessary for patient  management, should be tested with an alternative molecular  assay. Negative results do not preclude SARS-CoV-2 infection  and should not be used as the sole basis for patient  management decisions. Invalid results may be due to inhibiting  substances in the specimen and recollection should occur.         Narrative:      o Collect and clearly label specimen type:  o Upper respiratory specimen: One Nasopharyngeal Dry Swab NO  Transport Media.  o Hand deliver to laboratory ASAP  Indication for testing->Extended care facility admission to  semi private room    Culture + Gram Lenise Arena, Body Fluid [[id number]] Collected: 05/27/19 0534    Specimen: Body Fluid from Paracentesis Fluid Updated: 05/31/19 1428    Narrative:      [id number] called Micro Results of Gram Stain. Results read back by: [id number], by  [id number] on 05/27/2019 at 14:31  < 60ml  ORDER#: [id number]                                    ORDERED BY: CHUNG, SORA  SOURCE: Paracentesis Fluid peritoneal dialysis cathetCOLLECTED:  05/27/19 05:34  ANTIBIOTICS AT COLL.:                                RECEIVED :  05/27/19 10:09  ORDER ENTRY COMMENTS:  < 52ml  [id number] called Micro Results of Gram Stain. Results read back by: [id number], by [id number] on 05/27/2019 at 14:31  Stain, Gram                                FINAL       05/27/19 14:31  05/27/19   No Squamous epithelial cells seen             No WBCs seen             Rare Gram positive cocci             Stain performed on Cytospin  (concentrated) specimen  Culture and Gram Stain, Aerobic, Body FluidFINAL       05/31/19 14:28  05/30/19   The volume of sample received with this order is less than             the recommended volume. Submission of low volumes may             adversely affect recovery of pathogens and culture results             may be compromised.  05/31/19   No growth      Fungus culture [[id number]] Collected: 05/27/19 0259    Specimen: Other from Drainage Updated: 05/29/19 1220    Narrative:      blue swab  ORDER#: [id number]                                    ORDERED BY: Kalman Jewels  SOURCE: Drainage peritoneal  fluid                    COLLECTED:  05/27/19 02:59  ANTIBIOTICS AT COLL.:                                RECEIVED :  05/28/19 07:56  ORDER ENTRY COMMENTS:  blue swab  Stain, Fungal                              FINAL       05/29/19 12:20  05/29/19   No Fungal or Yeast Elements Seen  Culture Fungus                             PENDING      Fungus culture [[id number]] Collected: 05/27/19 2343    Specimen: Other from Wound Updated: 05/29/19 1220    Narrative:      ORDER#: [id number]                                    ORDERED BY: Kalman Jewels  SOURCE: Wound abdominal wound                        COLLECTED:  05/27/19 23:43  ANTIBIOTICS AT COLL.:                                RECEIVED :  05/28/19 07:56  Stain, Fungal                              FINAL       05/29/19 12:20  05/29/19   No Fungal or Yeast Elements Seen  Culture Fungus                             PENDING      Wound culture & gram stain [[id number]] Collected: 05/27/19 2343    Specimen: Wound Updated: 05/30/19 0829    Narrative:      blue swab  ORDER#: [id number]                                    ORDERED BY: Kalman Jewels  SOURCE: Wound peritoneal fluid                       COLLECTED:  05/27/19 23:43  ANTIBIOTICS AT COLL.:                                RECEIVED :  05/28/19 07:56  ORDER ENTRY COMMENTS:  blue swab  Stain, Gram                                FINAL        05/28/19 13:06  05/28/19   Few WBCs             Rare Squamous epithelial cells  No organisms seen  Culture and Gram Stain, Aerobic, Wound     FINAL       05/30/19 08:29   +  05/29/19   Light growth of Pseudomonas aeruginosa               Refer to susceptibilities on culture #[id number]        Wound culture & gram stain [[id number]] Collected: 05/27/19 0221    Specimen: Wound Updated: 05/30/19 0828    Narrative:      ORDER#: [id number]                                    ORDERED BY: Kalman Jewels  SOURCE: Wound abdominal wound                        COLLECTED:  05/27/19 02:21  ANTIBIOTICS AT COLL.:                                RECEIVED :  05/28/19 07:56  Stain, Gram                                FINAL       05/28/19 13:03  05/28/19   Moderate WBCs             Few Squamous epithelial cells             No organisms seen  Culture and Gram Stain, Aerobic, Wound     FINAL       05/30/19 08:28   +  05/30/19   Light growth of Pseudomonas aeruginosa      _____________________________________________________________________________                                  P.aeruginosa    ANTIBIOTICS                     MIC  INTRP      _____________________________________________________________________________  Amikacin                        <=8    S        Aztreonam                        8     S        Cefepime                         2     S        Ceftazidime                     <=2    S        Ciprofloxacin                 <=0.25   S        Gentamicin                      <=2    S        Levofloxacin                   <=  0.5   S        Meropenem                      <=0.5   S        Piperacillin/Tazobactam         4/4    S        Tobramycin                      <=2    S        _____________________________________________________________________________            S=SUSCEPTIBLE     I=INTERMEDIATE     R=RESISTANT                            N/S=NON-SUSCEPTIBLE   _____________________________________________________________________________             CT Abd/Pelvis with IV Contrast only    Result Date: 05/27/2019  1. Fluid-filled mildly distended loops of small bowel with distal decompressed small bowel loops. Findings may represent early or partial small bowel obstruction. Enteritis is felt to be less likely. 2. Trace bilateral pleural effusions. 3. Additional chronic findings as above. Delaney Meigs, MD  05/27/2019 6:07 AM      Echo Results     None          I have personally reviewed the patient's labs, medications, and imaging    Total visit time = 35 mins ; more than 50% spent counseling/coordinating care    Signed,    Murlean Hark MD, MPH  10:06 PM 06/02/2019   Please contact via [place]: [phone]  Pager: [phone]       This chart was generated using a medical voice-recognition software which does not employ spell-checking or grammar-checking features. It was dictated, all or in part, in a busy and often noisy patient care environment. I have taken all usual measures to dictate carefully and to review all aspects of this chart. Nonetheless, given the known and well-documented performance characteristics of voice recognition software in such patient care environments, this dictation still may contain unrecognized and wholly unintended errors or omissions.

## 2019-06-02 NOTE — UM Notes (Signed)
CSR 06/02/19        ID PROG NOTE:  Hospital Day: 4    Assessment and Plan:   1.  PD catheter peritonitis/Pseudomonas          Plan: 1. PO Levaquin  2.  Continue p.o. vancomycin    Subjective:   Nontoxic    Antibiotics:   Day 6/10    Other medications reviewed in EPIC.  Central Access:   New dialysis access day 4       06/02/19 1116   BP: 100/65   Pulse: 68   Resp: 16   Temp: 97.9 F (36.6 C)   SpO2: (!) 88%           SURGERY:    ASSESSMENT:   6 Days Post-Op S/P Procedure(s):  REMOVAL, PERITONEAL DIALYSIS CATHETER  EXPLORATORY LAPAROTOMY    Doing well, tolerating diet  Ongoing diaylsis    PLAN:   ID for antibiotics, infection of PD cath  Staple removal at POD 14  Discharge per primary      SUBJECTIVE:   The patient is doing fairly well.  Post operative pain is well controlled with medications.  Current dietary status:  Glucerna Supplement Quantity: A. One; Flavor: Strawberry; Frequency: Daily with lunch  Diet consistent carbohydrate and renal Protein restriction: 80 GM Protein and tolerating fairly well.  Flatus: yes. BM:  no.        NEPH PROG NOTE:  CC: follow-up ESRD    Assessment:     1. ESRD, on PD until 5/3 when she was switched to HD  2. Abdominal pain due to PD associated peritonitis - growing Pseudomonas and E. Coli, antibiotics change to zosyn --> po levoquin  - S/p PD catheter removal on 05/27/19  3. 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 Secondary hyperparathyroidism     Recommendations:   1. Isolated UF today  2. HD TTS, outpt HD at [facility]  3. Decrease Gabapentin to 300 mg daily given renal failure    Stable from renal standpoint  - plan for SNF

## 2019-06-02 NOTE — Plan of Care (Addendum)
Problem: Moderate/High Fall Risk Score >5  Goal: Patient will remain free of falls  Outcome: Progressing  Flowsheets (Taken 06/01/2019 1959)  High (Greater than 13):   HIGH-Visual cue at entrance to patient's room   HIGH-Bed alarm on at all times while patient in bed   HIGH-Apply yellow "Fall Risk" arm band   HIGH-Initiate use of floor mats as appropriate     Problem: Pain  Goal: Pain at adequate level as identified by patient  Outcome: Progressing  Flowsheets (Taken 06/02/2019 0411)  Pain at adequate level as identified by patient:   Identify patient comfort function goal   Assess pain on admission, during daily assessment and/or before any "as needed" intervention(s)   Reassess pain within 30-60 minutes of any procedure/intervention, per Pain Assessment, Intervention, Reassessment (AIR) Cycle   Evaluate if patient comfort function goal is met     Problem: Compromised Tissue integrity  Goal: Damaged tissue is healing and protected  Outcome: Progressing  Flowsheets (Taken 06/02/2019 0411)  Damaged tissue is healing and protected:   Monitor/assess Braden scale every shift   Keep intact skin clean and dry     Problem: Patient Receiving Advanced Renal Therapies  Goal: Therapy access site remains intact  Outcome: Progressing  Flowsheets (Taken 06/02/2019 0411)  Therapy access site remains intact: Assess therapy access site     Pt admitted for abdominal pain following d/c for same issue. Pt is A&Ox4, but has periods of drowsiness. 24h tele continues, NSR. Bedtime BG 194, no bedtime coverage ordered. External catheter in place, low dark output, HD pt T/T/S w/ additional scheduled for today. Abdominal staples from previous surgery secured and clean. PO abx given. Pt reports 8/10 abdominal pain, relieved by Morphine, pt declined scheduled Tylenol. Aquaphor applied to whole body, itching relieved. IR consult today, pt NPO since midnight.

## 2019-06-02 NOTE — Plan of Care (Signed)
Problem: Moderate/High Fall Risk Score >5  Goal: Patient will remain free of falls  Outcome: Progressing  Flowsheets  Taken 06/02/2019 0900  High (Greater than 13):   HIGH-Consider use of low bed   HIGH-Initiate use of floor mats as appropriate   HIGH-Utilize chair pad alarm for patient while in the chair   HIGH-Bed alarm on at all times while patient in bed   HIGH-Visual cue at entrance to patient's room  Taken 06/01/2019 1534  VH High Risk (Greater than 13):   Use chair-pad alarm device   Use of floor mat   A CHAIR PAD ALARM WILL BE USED WHEN PATIENT IS UP SITTING IN A CHAIR     Problem: Safety  Goal: Patient will be free from injury during hospitalization  Outcome: Progressing  Flowsheets (Taken 05/31/2019 1302 by Darrick Grinder, RN)  Patient will be free from injury during hospitalization:   Assess patient's risk for falls and implement fall prevention plan of care per policy   Provide and maintain safe environment   Use appropriate transfer methods   Ensure appropriate safety devices are available at the bedside   Include patient/ family/ care giver in decisions related to safety   Hourly rounding   Assess for patients risk for elopement and implement Elopement Risk Plan per policy  Goal: Patient will be free from infection during hospitalization  Outcome: Progressing  Flowsheets (Taken 05/28/2019 2302 by Gavin Pound, RN)  Free from Infection during hospitalization:   Assess and monitor for signs and symptoms of infection   Monitor lab/diagnostic results   Monitor all insertion sites (i.e. indwelling lines, tubes, urinary catheters, and drains)   Encourage patient and family to use good hand hygiene technique     Problem: Pain  Goal: Pain at adequate level as identified by patient  Outcome: Progressing  Flowsheets (Taken 06/02/2019 0411 by Trevor Iha, RN)  Pain at adequate level as identified by patient:   Identify patient comfort function goal   Assess pain on admission, during daily  assessment and/or before any "as needed" intervention(s)   Reassess pain within 30-60 minutes of any procedure/intervention, per Pain Assessment, Intervention, Reassessment (AIR) Cycle   Evaluate if patient comfort function goal is met     Problem: Side Effects from Pain Analgesia  Goal: Patient will experience minimal side effects of analgesic therapy  Outcome: Progressing  Flowsheets (Taken 05/28/2019 2302 by Gavin Pound, RN)  Patient will experience minimal side effects of analgesic therapy:   Monitor/assess patient's respiratory status (RR depth, effort, breath sounds)   Assess for changes in cognitive function   Prevent/manage side effects per LIP orders (i.e. nausea, vomiting, pruritus, constipation, urinary retention, etc.)   Evaluate for opioid-induced sedation with appropriate assessment tool (i.e. POSS)     Problem: Discharge Barriers  Goal: Patient will be discharged home or other facility with appropriate resources  Outcome: Progressing  Flowsheets (Taken 06/02/2019 2138)  Discharge to home or other facility with appropriate resources: Provide appropriate patient education     Problem: Psychosocial and Spiritual Needs  Goal: Demonstrates ability to cope with hospitalization/illness  Outcome: Progressing  Flowsheets (Taken 06/01/2019 1534)  Demonstrates ability to cope with hospitalizations/illness:   Encourage verbalization of feelings/concerns/expectations   Provide quiet environment   Reinforce positive adaptation of new coping behaviors   Assist patient to identify own strengths and abilities   Encourage participation in diversional activity     Problem: Altered GI Function  Goal: Fluid and electrolyte balance are achieved/maintained  Outcome: Progressing  Flowsheets (Taken 06/01/2019 1534)  Fluid and electrolyte balance are achieved/maintained:   Monitor intake and output every shift   Provide adequate hydration   Assess and reassess fluid and electrolyte status   Monitor/assess lab values and  report abnormal values   Monitor daily weight   Monitor for muscle weakness   Observe for cardiac arrhythmias  Goal: Elimination patterns are normal or improving  Outcome: Progressing  Flowsheets (Taken 06/01/2019 0503 by Trevor Iha, RN)  Elimination patterns are normal or improving:   Report abnormal assessment to physician   Anticipate/assist with toileting needs   Assess for normal bowel sounds   Monitor for abdominal distension   Monitor for abdominal discomfort  Goal: Nutritional intake is adequate  Outcome: Progressing  Flowsheets (Taken 05/29/2019 1249 by Priscille Heidelberg, RN)  Nutritional intake is adequate:   Monitor daily weights   Allow adequate time for meals   Assist patient with meals/food selection  Goal: Mobility/Activity is maintained at optimal level for patient  Outcome: Progressing  Flowsheets (Taken 05/29/2019 1249 by Priscille Heidelberg, RN)  Mobility/activity is maintained at optimal level for patient:   Increase mobility as tolerated/progressive mobility   Encourage independent activity per ability   Maintain proper body alignment  Goal: No bleeding  Outcome: Progressing  Flowsheets (Taken 06/02/2019 2138)  No bleeding: Monitor and assess vitals and hemodynamic parameters     Problem: Compromised Tissue integrity  Goal: Damaged tissue is healing and protected  Outcome: Progressing  Flowsheets (Taken 06/02/2019 0411 by Trevor Iha, RN)  Damaged tissue is healing and protected:   Monitor/assess Braden scale every shift   Keep intact skin clean and dry  Goal: Nutritional status is improving  Outcome: Progressing  Flowsheets (Taken 05/31/2019 1302 by Darrick Grinder, RN)  Nutritional status is improving:   Assist patient with eating   Allow adequate time for meals   Encourage patient to take dietary supplement(s) as ordered     Problem: Renal Instability  Goal: Fluid and electrolyte balance are achieved/maintained  Outcome: Progressing  Flowsheets (Taken 05/30/2019 0122 by Gavin Pound, RN)  Fluid and electrolyte balance are achieved/maintained:   Monitor intake and output every shift   Monitor/assess lab values and report abnormal values   Provide adequate hydration  Goal: Free from infection  Outcome: Progressing  Flowsheets (Taken 05/29/2019 1323 by Venetia Constable, RN)  Free from infection: Monitor/assess for signs and symptoms of infection     Problem: Patient Receiving Advanced Renal Therapies  Goal: Therapy access site remains intact  Outcome: Progressing  Flowsheets (Taken 06/02/2019 0411 by Trevor Iha, RN)  Therapy access site remains intact: Assess therapy access site     Pt is A&Ox4. Pain is somewhat less than the day before and she reanged from 5-7/10. Oxycodone and Tylenol given. Pt sat up in chair most of the day. She walked in the room with walker and 1 assist. Two loose bms. She has a good appetite and eats her meals. Refused Glucerna. BS monitored. Tele on. Calcium is irregular. Attempted to call Dialysis several times and couldn't get an answer. Pt said some told her she was suppose to have a short dialysis today to remove extra fluid.

## 2019-06-03 LAB — CBC AND DIFFERENTIAL
Absolute NRBC: 0 10*3/uL (ref 0.00–0.00)
Basophils Absolute Automated: 0.03 10*3/uL (ref 0.00–0.08)
Basophils Automated: 0.4 %
Eosinophils Absolute Automated: 0.14 10*3/uL (ref 0.00–0.44)
Eosinophils Automated: 1.7 %
Hematocrit: 28.6 % — ABNORMAL LOW (ref 34.7–43.7)
Hgb: 8.3 g/dL — ABNORMAL LOW (ref 11.4–14.8)
Immature Granulocytes Absolute: 0.26 10*3/uL — ABNORMAL HIGH (ref 0.00–0.07)
Immature Granulocytes: 3.2 %
Lymphocytes Absolute Automated: 1.69 10*3/uL (ref 0.42–3.22)
Lymphocytes Automated: 20.7 %
MCH: 28.6 pg (ref 25.1–33.5)
MCHC: 29 g/dL — ABNORMAL LOW (ref 31.5–35.8)
MCV: 98.6 fL — ABNORMAL HIGH (ref 78.0–96.0)
MPV: 11.5 fL (ref 8.9–12.5)
Monocytes Absolute Automated: 0.53 10*3/uL (ref 0.21–0.85)
Monocytes: 6.5 %
Neutrophils Absolute: 5.53 10*3/uL (ref 1.10–6.33)
Neutrophils: 67.5 %
Nucleated RBC: 0 /100 WBC (ref 0.0–0.0)
Platelets: 204 10*3/uL (ref 142–346)
RBC: 2.9 10*6/uL — ABNORMAL LOW (ref 3.90–5.10)
RDW: 13 % (ref 11–15)
WBC: 8.18 10*3/uL (ref 3.10–9.50)

## 2019-06-03 LAB — GLUCOSE WHOLE BLOOD - POCT
Whole Blood Glucose POCT: 227 mg/dL — ABNORMAL HIGH (ref 70–100)
Whole Blood Glucose POCT: 142 mg/dL — ABNORMAL HIGH (ref 70–100)
Whole Blood Glucose POCT: 181 mg/dL — ABNORMAL HIGH (ref 70–100)
Whole Blood Glucose POCT: 225 mg/dL — ABNORMAL HIGH (ref 70–100)

## 2019-06-03 LAB — PHOSPHORUS: Phosphorus: 3.8 mg/dL (ref 2.3–4.7)

## 2019-06-03 LAB — HEMOLYSIS INDEX: Hemolysis Index: 6 (ref 0–18)

## 2019-06-03 MED ORDER — ZINC OXIDE 40 % EX PSTE
PASTE | Freq: Four times a day (QID) | CUTANEOUS | Status: DC
Start: 2019-06-03 — End: 2019-06-13
  Filled 2019-06-03 (×2): qty 57

## 2019-06-03 MED ORDER — SODIUM CHLORIDE 0.9 % IV BOLUS
100.0000 mL | INTRAVENOUS | Status: AC | PRN
Start: 2019-06-03 — End: 2019-06-03

## 2019-06-03 MED ORDER — SODIUM CHLORIDE 0.9 % IV BOLUS
250.0000 mL | INTRAVENOUS | Status: AC | PRN
Start: 2019-06-03 — End: 2019-06-03

## 2019-06-03 MED ORDER — ALBUMIN HUMAN 25 % IV SOLN
100.0000 mL | INTRAVENOUS | Status: AC | PRN
Start: 2019-06-03 — End: 2019-06-03
  Administered 2019-06-03: 11:00:00 100 mL via INTRAVENOUS
  Filled 2019-06-03: qty 100

## 2019-06-03 MED ORDER — ALBUMIN HUMAN 25 % IV SOLN
INTRAVENOUS | Status: AC
Start: 2019-06-03 — End: 2019-06-03
  Filled 2019-06-03: qty 100

## 2019-06-03 NOTE — Consults (Signed)
Wound Assessment/Interventions:    Ask to evaluate a patient for wound care orders, Pt does not have an open wound at this time. Keep clean and dry, ordered Desitin per patient request.     Plan/Recommendations:    Skin care: perineal, buttocks, gluteal cleft, perianal, groin    1. Clean with wound cleanser/peri-wipes every 6 hours and PRN.   2. Apply Desitin cream with each incontinence episode.   3. Leave open to the air. Do not use foam dressing.  4. Please do not remove all the cream with each peri care, leave a white layer and apply more as needed.    Desitin forms a protective layer on the skin to soothe and relieve rash discomfort. It provides moisturization and emolliency to the skin, treating dry and irritated skin.    Desitin is ordered from the pharmacy.    Please re-consult [facility] Nurse team if further assistance required, spoke with bedside nurse.    Mikail Goostree [employer], RN, [employer]  Wound, Ostomy, and Continence Nurse Coordinator  [facility]  [address] [place], [place] [address]  T [phone] S [phone]  [email or web address]

## 2019-06-03 NOTE — Plan of Care (Signed)
Problem: Altered GI Function  Goal: Fluid and electrolyte balance are achieved/maintained  Outcome: Progressing  Flowsheets (Taken 06/01/2019 1534 by Jory Sims, RN)  Fluid and electrolyte balance are achieved/maintained:   Monitor intake and output every shift   Provide adequate hydration   Assess and reassess fluid and electrolyte status   Monitor/assess lab values and report abnormal values   Monitor daily weight   Monitor for muscle weakness   Observe for cardiac arrhythmias     Problem: Compromised Tissue integrity  Goal: Damaged tissue is healing and protected  Outcome: Progressing  Flowsheets (Taken 06/03/2019 0507 by Candice Camp, RN)  Damaged tissue is healing and protected:   Reposition patient every 2 hours and as needed unless able to reposition self   Relieve pressure to bony prominences for patients at moderate and high risk   Increase activity as tolerated/progressive mobility   Monitor/assess Braden scale every shift   Keep intact skin clean and dry   Use bath wipes, not soap and water, for daily bathing   Provide wound care per wound care algorithm   Use incontinence wipes for cleaning urine, stool and caustic drainage. Foley care as needed   Monitor external devices/tubes for correct placement to prevent pressure, friction and shearing   Encourage use of lotion/moisturizer on skin   Consult/collaborate with wound care nurse   Monitor patient's hygiene practices     Problem: Renal Instability  Goal: Fluid and electrolyte balance are achieved/maintained  Outcome: Progressing  Flowsheets (Taken 06/03/2019 0950 by Mickel Baas, RN)  Fluid and electrolyte balance are achieved/maintained:   Assess and reassess fluid and electrolyte status   Monitor/assess lab values and report abnormal values   Monitor daily weight   Observe for cardiac arrhythmias

## 2019-06-03 NOTE — Progress Notes (Signed)
Hands off report received from [place]. RN; Pt. Arrived to unit  A/O x4; No SOB c/o soreness on her post op site.; S/B Dr. Conley Canal pre HD.  Surg site w/ redness noted; staples intact; no drainage; Rt. CVC-art port w/ slight resistance on aspiration. Both lines flushed well. VSS. Will closely monitor progress in HD.   06/03/19 0915   Bedside Nurse Communication   Name of bedside RN - pre dialysis Eskeder Asamnew   Treatment Initiation- With Dialysis Precautions   Time Out/Safety Check Completed Yes   Consent for HD signed for this hospitalization Y   Blood Consent Verified N/A   Dialysis Precautions All Connections Secured;Saline Line Double Clamped   Dialysis Treatment Type Routine   Is patient diabetic? Yes   RO/Hemodialysis Architectural technologist   Is Total Chlorine less than 0.1 ppm? Yes   Orignial Total Chlorine Testing Time 0850   At 4 Hour Total Chlorine Testing Time 1250   RO/Hemodialysis Facilities manager Number [id number]   RO # [id number]   Water Hardness 0   pH 7.2   Pressure Test Verified Yes   Alarms Verified Passed   Machine Temperature 97 F (36.1 C)   Alarms Verified Yes   Na+ mEq (Machine) 138 mEq   Bicarb mEq (Machine) 35 mEq   Hemodialysis Conductivity (Machine) 13.5   Hemodialysis Conductivity (Meter) 14   Dialyzer Lot Number [id number]   Tubing Lot Number [id number]   RO Machine Log Completed Yes   Hepatitis Status   HBsAg (Antigen) Result Negative   HBsAg Date Drawn 05/13/19   HBsAg Repeat Draw Due Date 05/24/19   Dialysis Weight   Pre-Treatment Weight (Kg) 98.2   Scale Type ICU Bed Scale   Vitals   Temp 98.2 F (36.8 C)   Resp Rate 20   BP 117/56   SpO2 95 %   O2 Device None (Room air)   Assessment   Mental Status Alert;Oriented;Cooperative   Cardiac (WDL) WDL   Cardiac Regularity Regular   Cardiac Symptoms None   Cardiac Rhythm Normal Sinus Rhythm   Respiratory  WDL   Respiratory Pattern Regular   Bilateral Breath Sounds Clear;Diminished   Edema  WDL   Generalized Edema None   Facial None   Sacral  UTA   RLE Edema Non Pitting Edema   LLE Edema Non Pitting Edema   General Skin Color Appropriate for ethnicity   Skin Condition/Temp Warm;Dry   Gastrointestinal (WDL) WDL   Abdomen Inspection Nondistended   GI Symptoms None   Mobility Ambulatory with Assistance   Pain Assessment   Charting Type Assessment   Pain Scale Used Numeric Scale (0-10)   Numeric Pain Scale   Pain Score 3   POSS Score 1   Pain Location Abdomen   Pain Orientation Left   Pain Descriptors Sore  (from the staples (post R/O PD cath))   Pain Frequency Intermittent   Effect of Pain on Daily Activities moderate   Patient's Stated Comfort Functional Goal 1   Pain Intervention(s) Repositioned;Distraction   Multiple Pain Sites No   Hemodialysis Comments   Pre-Hemodialysis Comments timeout safety check- done   Hemodialysis History Information   Outpatient Nephrologist Dr. Conley Canal

## 2019-06-03 NOTE — Progress Notes (Signed)
HD tx. Completed fairly tolerated; Unable to achieve UF Goal ordered. Pt.'s max. UF tolerance was 2.5L only. VSS post HD; Rt. CVC worked well  Throughout tx.; Surg. Site (abdomen) for referral to Infec. MD. Per Dr. Conley Canal. Pt. Otherwise stable post tx. Report given to Eskedar Asamnew RN.     06/03/19 1310   Treatment Summary   Time Off Machine 1310   Duration of Treatment (Hours) 3.5   Dialyzer Clearance Moderately streaked   Fluid Volume Off (mL) 3200   Prime Volume (mL) 200   Rinseback Volume (mL) 200   Fluid Given: Normal Saline (mL) 200   Fluid Given: PRBC  0 mL   Fluid Given: Albumin (mL) 100   Fluid Given: Other (mL) 0   Total Fluid Given 700   Hemodialysis Net Fluid Removed 2500   Post Treatment Assessment   Post-Treatment Weight (Kg) 108.1   Patient Response to Treatment Tolerated   Additional Dialyzer Used 0   Permacath/Temporary Catheter 05/23/19 Permacath Right   Placement Date/Time: 05/23/19 1342   Inserted by: Papadouris  Access Type: Permacath  Orientation: Right  Access Location: Tunneled, Chest  Catheter Tip Location: svc  Central Line Infection Prevention Education provided?: Yes;No  Hand Hygiene: [place]...   Catheter Lumen Volume Venous 1.7 mL   Catheter Lumen Volume Arterial 1.7 mL   Dressing Status and Intervention Dressing Intact   Tego Caps on Catheter Yes   Vitals   Temp 97.9 F (36.6 C)   Heart Rate 64   Resp Rate 20   BP 137/62   SpO2 99 %   O2 Device None (Room air)   Assessment   Mental Status Alert;Oriented;Cooperative   Cardiac (WDL) WDL   Cardiac Regularity Regular   Cardiac Symptoms None   Cardiac Rhythm Normal Sinus Rhythm   Respiratory  WDL   Respiratory Pattern Regular   Bilateral Breath Sounds Clear;Diminished   Edema  WDL   Generalized Edema None   Facial None   Sacral UTA   RLE Edema Non Pitting Edema   LLE Edema Non Pitting Edema   General Skin Color Appropriate for ethnicity   Skin Condition/Temp Warm;Dry   Gastrointestinal (WDL) WDL   Abdomen Inspection Nondistended   GI  Symptoms None   Mobility Ambulatory with Assistance   Pain Assessment   Charting Type Assessment   Pain Scale Used Numeric Scale (0-10)   Numeric Pain Scale   Pain Score 0   POSS Score 1   Pain Location Abdomen   Education   Person taught Patient   Knowledge basis Minimal   Topics taught Access care   Teaching Tools Explain   Reponse Verbalizes Understanding   Bedside Nurse Communication   Name of bedside RN - post dialysis Eskader Asamnew

## 2019-06-03 NOTE — Progress Notes (Signed)
Patient POD 7 Ex lap and PD catheter removed.  Patient not seen.  Currently in hemodialysis.    Staples need to come out POD #14

## 2019-06-03 NOTE — Plan of Care (Signed)
Problem: Renal Instability  Goal: Fluid and electrolyte balance are achieved/maintained  Flowsheets (Taken 06/03/2019 0950)  Fluid and electrolyte balance are achieved/maintained:   Assess and reassess fluid and electrolyte status   Monitor/assess lab values and report abnormal values   Monitor daily weight   Observe for cardiac arrhythmias  Goal: Free from infection  Flowsheets (Taken 05/29/2019 1323 by Venetia Constable, RN)  Free from infection: Monitor/assess for signs and symptoms of infection     Problem: Patient Receiving Advanced Renal Therapies  Goal: Therapy access site remains intact  Flowsheets (Taken 06/03/2019 0950)  Therapy access site remains intact:   Assess therapy access site   Change therapy access site dressing as needed

## 2019-06-03 NOTE — Progress Notes (Addendum)
[facility] PROGRESS NOTE  Aaron Edelman, x [phone] ([place])      Date Time: 06/03/19 9:16 AM  Patient Name: [name]  Attending Physician: Delfina Redwood, MD    CC: follow-up ESRD    Assessment:     1. ESRD, on PD until 5/3 when she was switched to HD  2. Abdominal pain due to PD associated peritonitis - growing Pseudomonas and E. Coli, antibiotics change to zosyn --> po levoquin  - S/p PD catheter removal on 05/27/19  3. 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 Secondary hyperparathyroidism     Recommendations:   1. HD today aiming for 3Lnet UF  2. HD TTS, [facility] HD at [facility]  3. Will check phos level this am -- will be sent by Hd nurse    Stable from renal standpoint  - plan for SNF     Dialysis Treatment Data:  Blood flow rate: 275 mL/min  Dialysate flow rate: 700 mL/min  Ultrafiltration rate: 870 mL/hr    Dialysate potassium: 3 mEq/L  Dialysate calcium: 2.5 mEq/L    Arterial pressure: -162 mmHg  Venous pressure: 161mmHg      Case discussed with: pt and HD nurse    Verl Blalock, MD  [facility]  [phone] (office)  X [phone] ([place])    Subjective:  Very tender to palpation around the incision, skin appears a bit erythematous    Review of Systems:   Cardio:no chest pain  Pulm:no SOB  AF:[id number] at incision site    Physical Exam:     Vitals:    06/02/19 2010 06/03/19 0003 06/03/19 0340 06/03/19 0736   BP: 109/68 109/69 116/68 109/68   Pulse: 64 64 63 69   Resp: 17 16 16 16    Temp: 97.5 F (36.4 C) 97.5 F (36.4 C) 97.5 F (36.4 C) 97.9 F (36.6 C)   TempSrc: Oral Oral Oral Oral   SpO2: 98% 98% 96% 97%   Weight:       Height:           Intake and Output Summary (Last 24 hours) at Date Time    Intake/Output Summary (Last 24 hours) at 06/03/2019 0916  Last data filed at 06/03/2019 0600  Gross per 24 hour   Intake 870 ml   Output 100 ml   Net 770 ml       General: awake, alert, oriented x 3, no acute distress.  HEENT: sclera  anicteric  Neck: supple  Cardiovascular: regular rate and rhythm  Lungs: clear to auscultation bilaterally, bilateral air entry, normal work of breathing  Abdomen: soft, very tender to light palpation,erythema around the incision site  Extremities: edema  Neuro: A+O x 3, no gross motor/sensory deficits    AccessL R HDTC    Meds:      Scheduled Meds: PRN Meds:    acetaminophen, 1,000 mg, Oral, 4 times per day  amLODIPine, 5 mg, Oral, Q12H  atorvastatin, 40 mg, Oral, Daily  carvedilol, 25 mg, Oral, Q12H SCH  darbepoetin alfa, 60 mcg, Subcutaneous, Weekly  docusate sodium, 100 mg, Oral, Daily  gabapentin, 300 mg, Oral, Daily  heparin (porcine), 5,000 Units, Subcutaneous, Q12H SCH  insulin lispro, 1-8 Units, Subcutaneous, TID AC  lactobacillus/streptococcus, 1 capsule, Oral, Daily  levoFLOXacin, 250 mg, Oral, Q48H  pantoprazole, 40 mg, Oral, QAM AC  sevelamer, 800 mg, Oral, TID MEALS  vancomycin, 125 mg, Oral, Q6H  Continuous Infusions:   sodium chloride 20 mL/hr at 05/27/19 1100    sodium chloride 15 mL/hr at 06/02/19 2057    lactated ringers      sodium chloride, , PRN  albumin human, 100 mL, PRN  bisacodyl, 10 mg, Daily PRN  dextrose, 15 g of glucose, PRN   And  dextrose, 12.5 g, PRN   And  glucagon (rDNA), 1 mg, PRN  diphenhydrAMINE, 25 mg, Q6H PRN  guaiFENesin-dextromethorphan, 5 mL, Q4H PRN  naloxone, 0.2 mg, PRN  ondansetron, 4 mg, Once PRN  ondansetron, 4 mg, Q6H PRN   Or  ondansetron, 4 mg, Q6H PRN  oxyCODONE, 5 mg, Q6H PRN   Or  oxyCODONE, 10 mg, Q6H PRN  petrolatum, , PRN  sodium chloride, 100 mL, Q1H PRN  sodium chloride, 250 mL, PRN              Labs:     Recent Labs   Lab 06/02/19  0345 05/30/19  0311 05/29/19  1302   WBC 9.29 7.38 2.12*   Hgb 7.2* 7.8* 7.6*   Hematocrit 25.3* 27.2* 26.1*   Platelets 201 125* 116*     Recent Labs   Lab 06/02/19  0345 05/30/19  0311 05/29/19  1302 05/28/19  0437 05/28/19  0437   Sodium 137 139 136  More results in Results Review 137   Potassium 4.8 3.7 4.0  More  results in Results Review 4.7   Chloride 103 101 102  More results in Results Review 105   CO2 23 26 26   More results in Results Review 20*   BUN 28.0* 20.0* 29.0*  More results in Results Review 24.0*   Creatinine 5.8* 4.3* 5.0*  More results in Results Review 4.1*   Calcium 7.0* 7.0* 6.9*  More results in Results Review 7.1*   Albumin  --  1.4* 1.4*  --  1.6*   Glucose 174* 112* 146*  More results in Results Review 194*   EGFR 8.9 12.5 10.5  More results in Results Review 13.2   More results in Results Review = values in this interval not displayed.         Signed by: Verl Blalock, MD

## 2019-06-03 NOTE — Nursing Progress Note (Signed)
Pt is aox4.   Denied cp, sob.   Pt complained of abd discomfort at the incision site. Some redness around the surgical site. steeples intact. No drainage.   Pt has HD today. 3200 ml fluid removed.   Pt stable and resting.   Will continue to monitor pt

## 2019-06-03 NOTE — Plan of Care (Signed)
Problem: Pain  Goal: Pain at adequate level as identified by patient  Flowsheets (Taken 06/03/2019 0507)  Pain at adequate level as identified by patient:   Assess for risk of opioid induced respiratory depression, including snoring/sleep apnea. Alert healthcare team of risk factors identified.   Identify patient comfort function goal   Reassess pain within 30-60 minutes of any procedure/intervention, per Pain Assessment, Intervention, Reassessment (AIR) Cycle   Evaluate if patient comfort function goal is met   Offer non-pharmacological pain management interventions   Consult/collaborate with Physical Therapy, Occupational Therapy, and/or Speech Therapy   Consult/collaborate with Pain Service   Assess pain on admission, during daily assessment and/or before any "as needed" intervention(s)   Evaluate patient's satisfaction with pain management progress     Problem: Compromised Tissue integrity  Goal: Damaged tissue is healing and protected  Flowsheets (Taken 06/03/2019 0507)  Damaged tissue is healing and protected:   Reposition patient every 2 hours and as needed unless able to reposition self   Relieve pressure to bony prominences for patients at moderate and high risk   Increase activity as tolerated/progressive mobility   Monitor/assess Braden scale every shift   Keep intact skin clean and dry   Use bath wipes, not soap and water, for daily bathing   Provide wound care per wound care algorithm   Use incontinence wipes for cleaning urine, stool and caustic drainage. Foley care as needed   Monitor external devices/tubes for correct placement to prevent pressure, friction and shearing   Encourage use of lotion/moisturizer on skin   Consult/collaborate with wound care nurse   Monitor patient's hygiene practices     Problem: Renal Instability  Goal: Fluid and electrolyte balance are achieved/maintained  Flowsheets (Taken 06/03/2019 0507)  Fluid and electrolyte balance are achieved/maintained:   Monitor intake and  output every shift   Provide adequate hydration   Monitor for muscle weakness   Observe for seizure activity and initiate seizure precautions if indicated   Assess and reassess fluid and electrolyte status   Monitor daily weight   Monitor/assess lab values and report abnormal values   Assess for confusion/personality changes   Observe for cardiac arrhythmias   Follow fluid restrictions/IV/PO parameters   Pt alert and oriented x 4. Pt on external cath , pain management,  blood checked with coverage, safety bundle in place, eats and drinks well, will continue to follow plan of care.

## 2019-06-03 NOTE — Progress Notes (Addendum)
HOSPITALIST PROGRESS NOTE      Patient: [name]  Date: 06/03/2019    LOS: 5 Days  Admission Date: 05/27/2019   MRN: [id number]  Attending: Delfina Redwood  MD, MPH                    Please contact via Secure Chat                      ASSESSMENT/PLAN     [name] is a 64 y.o. female admitted with Generalized abdominal pain    Interval Summary:     Active Hospital Problems    Diagnosis    Bowel incontinence    Thrombocytopenia    Hypotension    Type 2 diabetes mellitus, with long-term current use of insulin    GERD (gastroesophageal reflux disease)    Generalized abdominal pain    ESRD (end stage renal disease) on dialysis    Peritonitis    Peritoneal dialysis catheter dysfunction, initial encounter       Patient Active Hospital Problem List:   Generalized abdominal pain (05/27/2019)   Peritoneal dialysis catheter dysfunction, initial encounter (05/12/2019)   Peritonitis (05/24/2019)    Assessment: PD catheter removed by general surgery with exploratory laparotomy on 05/28/2019. Pleural fluid growing Pseudomonas and E. Coli.  ID has been following.    Plan: Appreciate ID inputs  Switched from Zosyn to oral Cipro prior to discharge to skilled nursing facility   Oral vancomycin 125 mg every 6 hours has been continued on account of loose stools  incentive spirometry use encouraged   Per ID p.o. Cipro and p.o. vancomycin to continue to 5/19.  Stopped IV morphine  Additional pain regimen as below.     Bowel incontinence   Assessment-unclear if this is a complication of prior back injury; however this problem is chronic and has been going on for the past year  Plan-minimize risk for bedsores with frequent turning and quick response to any accidents     ESRD (end stage renal disease) on dialysis (05/24/2019)    Assessment: Patient was on PD prior to admission; she has been switched to hemodialysis for now; outpatient HD has been arranged for    Plan: Appreciate nephrology inputs    Outpatient dialysis  to continue on a Tuesday Thursday Saturday schedule     Thrombocytopenia (05/31/2019)    Assessment: Could be due to the acute infection as above; platelet counts are stable    Plan: Okay to continue heparin for DVT prophylaxis  Monitor daily CBC     Hypotension (05/31/2019)    Assessment: Likely due to the infection above    Plan: Antibiotics as above     Type 2 diabetes mellitus, with long-term current use of insulin (05/31/2019)    Assessment: Chronic and unknown control    Plan: Continue with insulin as above     GERD (gastroesophageal reflux disease) (05/31/2019)    Assessment: Chronic and unknown control    Plan: c/w home dose of protonix   started maalox       Analgesia: continue Tylenol 1 g every 6 hours; reduced dose of gabapentin to 300 mg every night; continue oxycodone 5 to 10 mg every 6 hours as needed; stopped IV morphine    Nutrition: Consistent carb plus renal    GI Prophylaxis: None    DVT Prophylaxis: Heparin    Code Status: DNR/allow natural death    DISPO: Anticipate discharge to  skilled nursing facility on 5/11.       [place] states that abdominal pain is better today.  She reports that she was able to sit up on the chair all day and also able to walk to the bathroom unassisted.    MEDICATIONS     Current Facility-Administered Medications   Medication Dose Route Frequency    acetaminophen  1,000 mg Oral 4 times per day    amLODIPine  5 mg Oral Q12H    atorvastatin  40 mg Oral Daily    carvedilol  25 mg Oral Q12H SCH    darbepoetin alfa  60 mcg Subcutaneous Weekly    docusate sodium  100 mg Oral Daily    gabapentin  300 mg Oral Daily    heparin (porcine)  5,000 Units Subcutaneous Q12H [facility]    insulin lispro  1-8 Units Subcutaneous TID AC    lactobacillus/streptococcus  1 capsule Oral Daily    levoFLOXacin  250 mg Oral Q48H    pantoprazole  40 mg Oral QAM AC    sevelamer  800 mg Oral TID MEALS    vancomycin  125 mg Oral Q6H       PHYSICAL EXAM     Vitals:    06/03/19  0736   BP: 109/68   Pulse: 69   Resp: 16   Temp: 97.9 F (36.6 C)   SpO2: 97%       Temperature: Temp  Min: 97.5 F (36.4 C)  Max: 97.9 F (36.6 C)  Pulse: Pulse  Min: 62  Max: 69  Respiratory: Resp  Min: 16  Max: 17  Non-Invasive BP: BP  Min: 99/64  Max: 116/68  Pulse Oximetry SpO2  Min: 88 %  Max: 100 %    Intake and Output Summary (Last 24 hours) at Date Time    Intake/Output Summary (Last 24 hours) at 06/03/2019 0911  Last data filed at 06/03/2019 0600  Gross per 24 hour   Intake 870 ml   Output 100 ml   Net 770 ml     Physical Exam   Constitutional: She is oriented to person, place, and time. No distress.   In mild distress due to abdominal tenderness during palpation   HENT:   Head: Normocephalic and atraumatic.   Right Ear: External ear normal.   Left Ear: External ear normal.   Mouth/Throat: Oropharynx is clear and moist.   Eyes: Pupils are equal, round, and reactive to light.   Neck: No JVD present. No tracheal deviation present. No thyromegaly present.   Cardiovascular: Normal rate, regular rhythm, normal heart sounds and intact distal pulses. Exam reveals no gallop and no friction rub.   No murmur heard.  Pulmonary/Chest: Effort normal and breath sounds normal. No stridor. No respiratory distress. She has no wheezes. She has no rales. She exhibits no tenderness.   Abdominal: Soft. Bowel sounds are normal. She exhibits no distension and no mass. There is abdominal tenderness. There is no rebound and no guarding.   Musculoskeletal:         General: No tenderness, deformity or edema. Normal range of motion.      Cervical back: Normal range of motion and neck supple.   Lymphadenopathy:     She has no cervical adenopathy.   Neurological: She is alert and oriented to person, place, and time. She has normal reflexes. No cranial nerve deficit. She exhibits normal muscle tone. Gait normal. Coordination normal. GCS score  is 15.   Skin: Skin is warm and dry. No rash noted. She is not diaphoretic. No erythema. No  pallor.   Tunneled dialysis catheter in place on the right chest wall   Psychiatric: Mood, memory, affect and judgment normal.       LABS     Recent Labs   Lab 06/02/19  0345 05/30/19  0311 05/29/19  1302   WBC 9.29 7.38 2.12*   RBC 2.53* 2.76* 2.67*   Hgb 7.2* 7.8* 7.6*   Hematocrit 25.3* 27.2* 26.1*   MCV 100.0* 98.6* 97.8*   Platelets 201 125* 116*       Recent Labs   Lab 06/02/19  0345 05/30/19  0311 05/29/19  1302 05/28/19  0437   Sodium 137 139 136 137   Potassium 4.8 3.7 4.0 4.7   Chloride 103 101 102 105   CO2 23 26 26  20*   BUN 28.0* 20.0* 29.0* 24.0*   Creatinine 5.8* 4.3* 5.0* 4.1*   Glucose 174* 112* 146* 194*   Calcium 7.0* 7.0* 6.9* 7.1*       Recent Labs   Lab 05/30/19  0311 05/29/19  1302 05/28/19  0437   ALT <6 <6 <6   AST (SGOT) 10 10 7    Bilirubin, Total 0.3 0.2 0.4   Albumin 1.4* 1.4* 1.6*   Alkaline Phosphatase 99 89 95                   Microbiology Results     Procedure Component Value Units Date/Time    Anaerobic culture [[id number]] Collected: 05/27/19 2343    Specimen: Other from Drainage Updated: 05/31/19 1205    Narrative:      blue swab  ORDER#: [id number]                                    ORDERED BY: Kalman Jewels  SOURCE: Drainage peritoneal fluid                    COLLECTED:  05/27/19 23:43  ANTIBIOTICS AT COLL.:                                RECEIVED :  05/28/19 07:56  ORDER ENTRY COMMENTS:  blue swab  Culture, Anaerobic Bacteria                PRELIM      05/31/19 12:05  05/31/19   No growth to date, final report to follow      Anaerobic culture [[id number]] Collected: 05/27/19 2343    Specimen: Other from Wound Updated: 05/31/19 1205    Narrative:      ORDER#: [id number]                                    ORDERED BY: Kalman Jewels  SOURCE: Wound abdominal wound                        COLLECTED:  05/27/19 23:43  ANTIBIOTICS AT COLL.:                                RECEIVED :  05/28/19 07:56  Culture, Anaerobic Bacteria  PRELIM      05/31/19 12:05  05/31/19   No growth  to date, final report to follow      Blood Culture Aerobic/Anaerobic #1 [[id number]] Collected: 05/27/19 0507    Specimen: Arm from Blood, Venipuncture Updated: 05/31/19 1021    Narrative:      ORDER#: [id number]                                    ORDERED BY: CHUNG, SORA  SOURCE: Blood, Venipuncture arm                      COLLECTED:  05/27/19 05:07  ANTIBIOTICS AT COLL.:                                RECEIVED :  05/27/19 09:50  Culture Blood Aerobic and Anaerobic        PRELIM      05/31/19 10:21  05/28/19   No Growth after 1 day/s of incubation.  05/29/19   No Growth after 2 day/s of incubation.  05/30/19   No Growth after 3 day/s of incubation.  05/31/19   No Growth after 4 day/s of incubation.      Blood Culture Aerobic/Anaerobic #2 [[id number]] Collected: 05/27/19 0507    Specimen: Arm from Blood, Venipuncture Updated: 05/31/19 1021    Narrative:      ORDER#: [id number]                                    ORDERED BY: CHUNG, SORA  SOURCE: Blood, Venipuncture arm                      COLLECTED:  05/27/19 05:07  ANTIBIOTICS AT COLL.:                                RECEIVED :  05/27/19 09:50  Culture Blood Aerobic and Anaerobic        PRELIM      05/31/19 10:21  05/28/19   No Growth after 1 day/s of incubation.  05/29/19   No Growth after 2 day/s of incubation.  05/30/19   No Growth after 3 day/s of incubation.  05/31/19   No Growth after 4 day/s of incubation.      COVID-19 (SARS-COV-2) [employer] [facility]) [[id number]] Collected: 05/27/19 0624    Specimen: Nasopharyngeal Swab from Nasopharynx Updated: 05/27/19 0656     Purpose of COVID testing Screening     SARS-CoV-2 Specimen Source Nasopharyngeal     SARS CoV 2 Overall Result Negative     Comment: Test performed using the Abbott ID NOW EUA assay.  Please see Fact Sheets for patients and providers located at:  http://[facility]-hall.info/  This test is for the qualitative detection of SARS-CoV-2  (COVID19) nucleic acid. Viral nucleic acids may persist in vivo,   independent of viability. Detection of viral nucleic acid does  not imply the presence of infectious virus, or that virus  nucleic acid is the cause of clinical symptoms. Negative  results should be treated as presumptive and, if inconsistent  with clinical signs and symptoms or necessary for patient  management, should be tested with an alternative molecular  assay. Negative results do not preclude SARS-CoV-2 infection  and should not be used as the sole basis for patient  management decisions. Invalid results may be due to inhibiting  substances in the specimen and recollection should occur.         Narrative:      o Collect and clearly label specimen type:  o Upper respiratory specimen: One Nasopharyngeal Dry Swab NO  Transport Media.  o Hand deliver to laboratory ASAP  Indication for testing->Extended care facility admission to  semi private room    Culture + Gram Lenise Arena, Body Fluid [[id number]] Collected: 05/27/19 0534    Specimen: Body Fluid from Paracentesis Fluid Updated: 05/31/19 1428    Narrative:      [id number] called Micro Results of Gram Stain. Results read back by: [id number], by  [id number] on 05/27/2019 at 14:31  < 91ml  ORDER#: [id number]                                    ORDERED BY: CHUNG, SORA  SOURCE: Paracentesis Fluid peritoneal dialysis cathetCOLLECTED:  05/27/19 05:34  ANTIBIOTICS AT COLL.:                                RECEIVED :  05/27/19 10:09  ORDER ENTRY COMMENTS:  < 61ml  [id number] called Micro Results of Gram Stain. Results read back by: [id number], by [id number] on 05/27/2019 at 14:31  Stain, Gram                                FINAL       05/27/19 14:31  05/27/19   No Squamous epithelial cells seen             No WBCs seen             Rare Gram positive cocci             Stain performed on Cytospin (concentrated) specimen  Culture and Gram Stain, Aerobic, Body FluidFINAL       05/31/19 14:28  05/30/19   The volume of sample received with this order is less than             the recommended volume.  Submission of low volumes may             adversely affect recovery of pathogens and culture results             may be compromised.  05/31/19   No growth      Fungus culture [[id number]] Collected: 05/27/19 0259    Specimen: Other from Drainage Updated: 05/29/19 1220    Narrative:      blue swab  ORDER#: [id number]                                    ORDERED BY: Kalman Jewels  SOURCE: Drainage peritoneal fluid                    COLLECTED:  05/27/19 02:59  ANTIBIOTICS AT COLL.:                                RECEIVED :  05/28/19 07:56  ORDER ENTRY COMMENTS:  blue swab  Stain, Fungal                              FINAL       05/29/19 12:20  05/29/19   No Fungal or Yeast Elements Seen  Culture Fungus                             PENDING      Fungus culture [[id number]] Collected: 05/27/19 2343    Specimen: Other from Wound Updated: 05/29/19 1220    Narrative:      ORDER#: [id number]                                    ORDERED BY: Kalman Jewels  SOURCE: Wound abdominal wound                        COLLECTED:  05/27/19 23:43  ANTIBIOTICS AT COLL.:                                RECEIVED :  05/28/19 07:56  Stain, Fungal                              FINAL       05/29/19 12:20  05/29/19   No Fungal or Yeast Elements Seen  Culture Fungus                             PENDING      Wound culture & gram stain [[id number]] Collected: 05/27/19 2343    Specimen: Wound Updated: 05/30/19 0829    Narrative:      blue swab  ORDER#: [id number]                                    ORDERED BY: Kalman Jewels  SOURCE: Wound peritoneal fluid                       COLLECTED:  05/27/19 23:43  ANTIBIOTICS AT COLL.:                                RECEIVED :  05/28/19 07:56  ORDER ENTRY COMMENTS:  blue swab  Stain, Gram                                FINAL       05/28/19 13:06  05/28/19   Few WBCs             Rare Squamous epithelial cells             No organisms seen  Culture and Gram Stain, Aerobic, Wound     FINAL       05/30/19 08:29   +  05/29/19    Light growth of Pseudomonas aeruginosa               Refer to  susceptibilities on culture #[id number]        Wound culture & gram stain [[id number]] Collected: 05/27/19 0221    Specimen: Wound Updated: 05/30/19 0828    Narrative:      ORDER#: [id number]                                    ORDERED BY: Kalman Jewels  SOURCE: Wound abdominal wound                        COLLECTED:  05/27/19 02:21  ANTIBIOTICS AT COLL.:                                RECEIVED :  05/28/19 07:56  Stain, Gram                                FINAL       05/28/19 13:03  05/28/19   Moderate WBCs             Few Squamous epithelial cells             No organisms seen  Culture and Gram Stain, Aerobic, Wound     FINAL       05/30/19 08:28   +  05/30/19   Light growth of Pseudomonas aeruginosa      _____________________________________________________________________________                                  P.aeruginosa    ANTIBIOTICS                     MIC  INTRP      _____________________________________________________________________________  Amikacin                        <=8    S        Aztreonam                        8     S        Cefepime                         2     S        Ceftazidime                     <=2    S        Ciprofloxacin                 <=0.25   S        Gentamicin                      <=2    S        Levofloxacin                   <=0.5   S        Meropenem                      <=0.5   S  Piperacillin/Tazobactam         4/4    S        Tobramycin                      <=2    S        _____________________________________________________________________________            S=SUSCEPTIBLE     I=INTERMEDIATE     R=RESISTANT                            N/S=NON-SUSCEPTIBLE  _____________________________________________________________________________             No results found.    Echo Results     None          I have personally reviewed the patient's labs, medications, and imaging    Total visit time = 35 mins ; more  than 50% spent counseling/coordinating care    Signed,    Reynaldo Minium, MD, MPH  9:11 AM 06/03/2019   Please contact via Secure Chat          This chart was generated using a medical voice-recognition software which does not employ spell-checking or grammar-checking features. It was dictated, all or in part, in a busy and often noisy patient care environment. I have taken all usual measures to dictate carefully and to review all aspects of this chart. Nonetheless, given the known and well-documented performance characteristics of voice recognition software in such patient care environments, this dictation still may contain unrecognized and wholly unintended errors or omissions.

## 2019-06-04 LAB — BASIC METABOLIC PANEL
Anion Gap: 11 (ref 5.0–15.0)
BUN: 23 mg/dL — ABNORMAL HIGH (ref 7.0–19.0)
CO2: 24 mEq/L (ref 22–29)
Calcium: 7.3 mg/dL — ABNORMAL LOW (ref 8.5–10.5)
Chloride: 102 mEq/L (ref 100–111)
Creatinine: 5.1 mg/dL — ABNORMAL HIGH (ref 0.6–1.0)
Glucose: 263 mg/dL — ABNORMAL HIGH (ref 70–100)
Potassium: 4.2 mEq/L (ref 3.5–5.1)
Sodium: 137 mEq/L (ref 136–145)

## 2019-06-04 LAB — CBC
Absolute NRBC: 0 10*3/uL (ref 0.00–0.00)
Hematocrit: 26.5 % — ABNORMAL LOW (ref 34.7–43.7)
Hgb: 7.6 g/dL — ABNORMAL LOW (ref 11.4–14.8)
MCH: 28.5 pg (ref 25.1–33.5)
MCHC: 28.7 g/dL — ABNORMAL LOW (ref 31.5–35.8)
MCV: 99.3 fL — ABNORMAL HIGH (ref 78.0–96.0)
MPV: 11.1 fL (ref 8.9–12.5)
Nucleated RBC: 0 /100 WBC (ref 0.0–0.0)
Platelets: 170 10*3/uL (ref 142–346)
RBC: 2.67 10*6/uL — ABNORMAL LOW (ref 3.90–5.10)
RDW: 13 % (ref 11–15)
WBC: 9.82 10*3/uL — ABNORMAL HIGH (ref 3.10–9.50)

## 2019-06-04 LAB — GLUCOSE WHOLE BLOOD - POCT
Whole Blood Glucose POCT: 266 mg/dL — ABNORMAL HIGH (ref 70–100)
Whole Blood Glucose POCT: 236 mg/dL — ABNORMAL HIGH (ref 70–100)
Whole Blood Glucose POCT: 177 mg/dL — ABNORMAL HIGH (ref 70–100)
Whole Blood Glucose POCT: 263 mg/dL — ABNORMAL HIGH (ref 70–100)

## 2019-06-04 LAB — HEMOLYSIS INDEX: Hemolysis Index: 2 (ref 0–18)

## 2019-06-04 LAB — GFR: EGFR: 10.3

## 2019-06-04 MED ORDER — VANCOMYCIN HCL 250 MG/5ML PO SOLR
125.00 mg | Freq: Four times a day (QID) | ORAL | 0 refills | Status: AC
Start: 2019-06-04 — End: 2019-06-11

## 2019-06-04 MED ORDER — LEVOFLOXACIN 250 MG PO TABS
250.0000 mg | ORAL_TABLET | ORAL | 0 refills | Status: DC
Start: 2019-06-05 — End: 2019-06-12

## 2019-06-04 MED ORDER — CARVEDILOL 25 MG PO TABS
25.0000 mg | ORAL_TABLET | Freq: Two times a day (BID) | ORAL | Status: DC
Start: 2019-06-04 — End: 2019-07-28

## 2019-06-04 MED ORDER — RISAQUAD PO CAPS
1.00 | ORAL_CAPSULE | Freq: Every day | ORAL | Status: DC
Start: 2019-06-04 — End: 2019-07-14

## 2019-06-04 MED ORDER — ZINC OXIDE 40 % EX PSTE
PASTE | Freq: Four times a day (QID) | CUTANEOUS | Status: DC
Start: 2019-06-04 — End: 2019-07-14

## 2019-06-04 MED ORDER — OXYCODONE HCL 5 MG PO TABS
5.0000 mg | ORAL_TABLET | Freq: Four times a day (QID) | ORAL | Status: DC | PRN
Start: 2019-06-04 — End: 2019-07-18

## 2019-06-04 NOTE — Plan of Care (Signed)
Discharge Recommendation: SNF   DME Recommended for Discharge: Grab bars, Tub transfer bench, Front wheel walker, Medical alert system    OT Frequency Recommended: 2-3x/wk     Is PT evaluation indicated at this time? This patient is already on PT caseload.     PMP Activity: Step 6 - Walks in Room  Distance Walked (ft) (Step 6,7): 0 Feet  (Please See Therapy Evaluation for device and assistance level needed)    Treatment Interventions: ADL retraining, Functional transfer training, Endurance training, Patient/Family training, Equipment eval/education, UE strengthening/ROM       Goal Formulation: Patient  Time For Goal Achievement: by time of discharge  ADL Goals  Patient will dress lower body: Moderate Assist, by time of discharge, Not met  Pt will complete bathing: Contact Guard Assist, by time of discharge, Partly met  Mobility and Transfer Goals  Pt will transfer bed to [facility]: Minimal Assist, Goal met(transfers to toilet w/ MIN A)  Pt will transfer bed to toilet: New goal, Supervision, with rolling walker, Not met  Neuro Re-Ed Goals  Pt will perform dynamic sitting balance: Modified Independent, by time of discharge, Goal met

## 2019-06-04 NOTE — Progress Notes (Signed)
Call from [facility], asking for update on pt's d/c date.  Writer spoke with hosp CC, Sharyn Lull who advised pt is recommended for d/c to SNF.  Writer to continue to follow for final d/c plan     Freida Busman, HD Care Coordinator  [phone]

## 2019-06-04 NOTE — Plan of Care (Signed)
Problem: Safety  Goal: Patient will be free from injury during hospitalization  Flowsheets (Taken 06/04/2019 0414)  Patient will be free from injury during hospitalization:   Provide and maintain safe environment   Ensure appropriate safety devices are available at the bedside   Use appropriate transfer methods   Assess patient's risk for falls and implement fall prevention plan of care per policy     Problem: Renal Instability  Goal: Fluid and electrolyte balance are achieved/maintained  Flowsheets (Taken 06/04/2019 0414)  Fluid and electrolyte balance are achieved/maintained:   Monitor for muscle weakness   Observe for seizure activity and initiate seizure precautions if indicated   Assess and reassess fluid and electrolyte status   Provide adequate hydration   Pt alert and oriented x 4 pm, on erterbal cath, incontinent care done, safety bundle in place, pt c/o pain, pain management done, hourly round done, will continue to follows plan of  care

## 2019-06-04 NOTE — PT Progress Note (Signed)
Physical Therapy Note  [facility]  Physical Therapy Treatment    Patient:  [name]  MRN#:  [id number]  Unit:  [address] [place]  Room/Bed:  O8786/V6720.B    Time of treatment:  Time Calculation  PT Received On: 06/04/19  Start Time: 1720  Stop Time: 1743  Time Calculation (min): 23 min            Chart Review and Collaboration with Care Team: 8 minutes, not included in above time.    PT Visit Number: 3    Precautions:   Precautions  Weight Bearing Status: no restrictions  Aspiration Precautions: see MD/RN orders  Other Precautions: Falls    Personal Protective Equipment (PPE)  procedure mask and pt wore procedure mask    Updated X-Rays/Tests/Labs:  Lab Results   Component Value Date/Time    HGB 7.6 (L) 06/04/2019 05:36 AM    HCT 26.5 (L) 06/04/2019 05:36 AM    K 4.2 06/04/2019 05:36 AM    NA 137 06/04/2019 05:36 AM    INR 0.9 03/18/2006 03:00 PM    TROPI 0.04 05/17/2019 06:20 PM    TROPI 0.05 05/17/2019 01:37 PM    TROPI 0.01 03/18/2006 06:10 PM       All imaging reviewed, please see chart for details.      Subjective:  "too much pain from stiches"    Patient Goal: To get pain medication     Pain Assessment  Pain Assessment: Numeric Scale (0-10)  Pain Score: 5-moderate pain  POSS Score: Awake and Alert  Pain Location: (left side of body abdomen to arm pit )  Pain Orientation: Left  Pain Descriptors: Aching  Pain Frequency: Intermittent;Increases with movement  Effect of Pain on Daily Activities: moderate  Pain Intervention(s): Medication (See eMAR);Repositioned;Distraction;Emotional support           Patient's medical condition is appropriate for Physical Therapy intervention at this time.  Patient is agreeable to participation in the therapy session. Nursing clears patient for therapy.      Objective:  Observation of Patient/Vital Signs:  Blood pressure supine 122/71, pulse 71,  Blood pressure sitting 110/61, pulse 72,  Blood pressure sitting 108/59, pulse 76,     No  Orthostatics noted.    temperature 97.3 F (36.3 C), temperature source Oral, resp. rate 17, height 1.575 m (5\' 2" ), weight 111.9 kg (246 lb 11.1 oz), SpO2 96 %.      Cognition/Neuro Status  Arousal/Alertness: Appropriate responses to stimuli  Attention Span: Appears intact  Orientation Level: Oriented to place;Oriented to situation;Oriented to person  Memory: Appears intact  Following Commands: Follows all commands and directions without difficulty  Safety Awareness: minimal verbal instruction  Insights: Fully aware of deficits  Problem Solving: Able to problem solve independently  Comments: pt. crying out in pain t/o session   Behavior: calm;cooperative  Motor Planning: intact  Coordination: intact    Musculoskeletal Examination  Gross ROM  Right Upper Extremity ROM: within functional limits  Left Upper Extremity ROM: within functional limits  Right Lower Extremity ROM: within functional limits  Left Lower Extremity ROM: within functional limits  Gross Strength  Right Lower Extremity Strength: 3+/5  Left Lower Extremity Strength: 3+/5            Functional Mobility  Rolling: Contact Guard Assist;to Right;Logroll  Supine to Sit: Minimal Assist;Increased Time;Increased Effort;HOB raised;to Right  Scooting to EOB: Minimal Assist;Increased Time;Increased Effort;to Left;to Right  Sit to Stand: Minimal Assist;Increased Time;Increased  Effort;bed elevated;with instruction for hand placement to increase safety  Stand to Sit: Minimal Assist  Transfers  Bed to Chair: Minimal Assist;to Right  Device Used for Functional Transfer: front-wheeled walker  Locomotion  Ambulation: Minimal Assist;with front-wheeled walker(40 feet with 1 rest break 2/2 c/o dizziness)  Pattern: decreased cadence;decreased step length(decreased knee/hip flex)  Distance Walked (ft) (Step 6,7): 40 Feet(with 1 rest break 2/2 dizziness)          Neuro Re-Ed  Neuro Re-ed/Motor Control: sitting;standing;with instruction;supervision;minimal assist  Sitting  Balance: sitting reaching activities;with instruction;without support;supervision  Standing Balance: patient education;dynamic gait training;with instruction;with support;minimal assist           Educated the patient to role of physical therapy, plan of care, goals of therapy and safety with mobility and ADLs, energy conservation techniques, home safety with verbalized understanding.    Patient left in bedside chair with alarm and all other medical equipment in place and call bell and all personal items/needs within reach.  RN notified of session outcome.      Assessment:  Pt presents with pain, dizziness during ambulation, decreased in strength, balance and activity tolerance. Pt demo good progress with ambulation, but continues to demo functional limitations with bed mobility, transfers and residential mobility. Pt would benefit from continues skilled PT to address above impairments and improve functional limitations to reach her goals and  facilitate safe d/c.      PMP Activity: Step 6 - Walks in Room  Distance Walked (ft) (Step 6,7): 40 Feet(with 1 rest break 2/2 dizziness)    Plan:  Treatment/Interventions: Exercise, Gait training, LE strengthening/ROM, Endurance training, Bed mobility, Continued evaluation      PT Frequency: 2-3x/wk   Continue plan of care.    Goals:  Goals  Goal Formulation: With patient  Time for Goal Acheivement: By time of discharge  Pt Will Go Supine To Sit: modified independent, to maximize functional mobility and independence, Partly met  Pt Will Perform Sit To Supine: modified independent, to maximize functional mobility and independence, Partly met  Pt Will Perform Sit to Stand: with supervision, to maximize functional mobility and independence, Partly met  Pt Will Transfer Bed/Chair: with rolling walker, with supervision, to maximize functional mobility and independence, Partly met  Pt Will Ambulate: 51-100 feet, with rolling walker, with stand by assist, to maximize functional  mobility and independence, Partly met        DME Recommended for Discharge: Front wheel walker  Discharge Recommendation: SNF(may progess home w/ HHPT/OT and 24/7 care)    Jake Seats, PT  06/04/2019 5:57 PM

## 2019-06-04 NOTE — Discharge Instr - Diet (Signed)
Diabetic- renal diet

## 2019-06-04 NOTE — Progress Notes (Signed)
[facility] PROGRESS NOTE  [place], x [phone] ([place])      Date Time: 06/04/19 5:44 PM  Patient Name: [name]  Attending Physician: Delfina Redwood, MD    CC: follow-up ESRD    Assessment:     1. ESRD- previously on PD; switched to HD on  5/3   2. PD associated peritonitis - growing Pseudomonas and E. Coli, antibiotics change to zosyn --> po levoquin  - S/p PD catheter removal on 05/27/19  3. 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 Secondary hyperparathyroidism     Recommendations:     1. HD in am;  Plan UF 3L   2. HD TTS, outpt HD at [facility]  3. Will check phos level this am -- will be sent by Hd nurse    Stable from renal standpoint for SNF      Case discussed with: pt and HD nurse    Ulyses Amor, MD  [facility]  [phone] (office)  X [phone] ([place])    Subjective:Feels better; mild tenderness over surgical site    Review of Systems:   Cardio:no chest pain  Pulm:no SOB  PT:[id number] at incision site    Physical Exam:     Vitals:    06/04/19 0830 06/04/19 1130 06/04/19 1233 06/04/19 1652   BP: 135/58 124/74 124/74 122/71   Pulse: 72 69  68   Resp:  17  17   Temp:  98.1 F (36.7 C)  97.3 F (36.3 C)   TempSrc:  Oral  Oral   SpO2:  100%  96%   Weight:       Height:           Intake and Output Summary (Last 24 hours) at Date Time    Intake/Output Summary (Last 24 hours) at 06/04/2019 1744  Last data filed at 06/04/2019 1312  Gross per 24 hour   Intake 1260 ml   Output 0 ml   Net 1260 ml       General: awake, alert, oriented x 3, no acute distress.  HEENT: sclera anicteric  Neck: supple  Cardiovascular: regular rate and rhythm  Lungs: clear to auscultation bilaterally, bilateral air entry, normal work of breathing  Abdomen: soft, very tender to light palpation,erythema around the LLQ incision site  Extremities: + LE edema  Neuro: A+O x 3, no gross motor/sensory deficits    Access R TDC    Meds:      Scheduled Meds: PRN Meds:     acetaminophen, 1,000 mg, Oral, 4 times per day  amLODIPine, 5 mg, Oral, Q12H  atorvastatin, 40 mg, Oral, Daily  carvedilol, 25 mg, Oral, Q12H SCH  darbepoetin alfa, 60 mcg, Subcutaneous, Weekly  docusate sodium, 100 mg, Oral, Daily  gabapentin, 300 mg, Oral, Daily  heparin (porcine), 5,000 Units, Subcutaneous, Q12H SCH  insulin lispro, 1-8 Units, Subcutaneous, TID AC  lactobacillus/streptococcus, 1 capsule, Oral, Daily  levoFLOXacin, 250 mg, Oral, Q48H  pantoprazole, 40 mg, Oral, QAM AC  sevelamer, 800 mg, Oral, TID MEALS  vancomycin, 125 mg, Oral, Q6H  zinc Oxide, , Topical, Q6H          Continuous Infusions:   sodium chloride 20 mL/hr at 05/27/19 1100    sodium chloride 15 mL/hr at 06/02/19 2057    lactated ringers      sodium chloride, , PRN  bisacodyl, 10 mg, Daily PRN  dextrose, 15 g of glucose, PRN   And  dextrose, 12.5 g, PRN   And  glucagon (rDNA), 1 mg, PRN  diphenhydrAMINE, 25 mg, Q6H PRN  guaiFENesin-dextromethorphan, 5 mL, Q4H PRN  naloxone, 0.2 mg, PRN  ondansetron, 4 mg, Once PRN  ondansetron, 4 mg, Q6H PRN   Or  ondansetron, 4 mg, Q6H PRN  oxyCODONE, 5 mg, Q6H PRN   Or  oxyCODONE, 10 mg, Q6H PRN  petrolatum, , PRN              Labs:     Recent Labs   Lab 06/04/19  0536 06/03/19  0954 06/02/19  0345   WBC 9.82* 8.18 9.29   Hgb 7.6* 8.3* 7.2*   Hematocrit 26.5* 28.6* 25.3*   Platelets 170 204 201     Recent Labs   Lab 06/04/19  0536 06/03/19  0954 06/02/19  0345 05/30/19  0311 05/29/19  1302 05/29/19  1302   Sodium 137  --  137 139  More results in Results Review 136   Potassium 4.2  --  4.8 3.7  More results in Results Review 4.0   Chloride 102  --  103 101  More results in Results Review 102   CO2 24  --  23 26  More results in Results Review 26   BUN 23.0*  --  28.0* 20.0*  More results in Results Review 29.0*   Creatinine 5.1*  --  5.8* 4.3*  More results in Results Review 5.0*   Calcium 7.3*  --  7.0* 7.0*  More results in Results Review 6.9*   Albumin  --   --   --  1.4*  --  1.4*   Phosphorus   --  3.8  --   --   --   --    Glucose 263*  --  174* 112*  More results in Results Review 146*   EGFR 10.3  --  8.9 12.5  More results in Results Review 10.5   More results in Results Review = values in this interval not displayed.         Signed by: Ulyses Amor, MD

## 2019-06-04 NOTE — OT Progress Note (Signed)
Occupational Therapy Note  [facility]  Occupational Therapy Treatment     Patient: [name]     MRN#: [id number]   Unit: [place]  Bed: A2308/A2308.B    Time of treatment:  Time Calculation  OT Received On: 06/04/19  Start Time: 0947  Stop Time: 1005  Time Calculation (min): 18 min             Chart Review and Collaboration with Care Team: 5 minutes, not included in above time.    OT Visit Number: 2    Precautions and Contraindications:    Precautions  Weight Bearing Status: no restrictions  Other Precautions: Falls    Personal Protective Equipment (PPE)  gloves, procedure mask and eye shield/covering    Updated Labs:  Lab Results   Component Value Date/Time    HGB 7.6 (L) 06/04/2019 05:36 AM    HCT 26.5 (L) 06/04/2019 05:36 AM    K 4.2 06/04/2019 05:36 AM    NA 137 06/04/2019 05:36 AM    INR 0.9 03/18/2006 03:00 PM    TROPI 0.04 05/17/2019 06:20 PM    TROPI 0.05 05/17/2019 01:37 PM    TROPI 0.01 03/18/2006 06:10 PM       All imaging reviewed, please see chart for details.    Subjective:    Marland Kitchen  Pt reports dizziness with ambulation. RN present and aware. Vitals monitored.  Patient Goal: None stated    Patient's medical condition is appropriate for Occupational Therapy intervention at this time.  Patient is agreeable to participation in the therapy session. Nursing clears patient for therapy.  Pain Assessment  Pain Assessment: Numeric Scale (0-10)  Pain Score: 8-severe pain  POSS Score: Awake and Alert  Pain Location: Abdomen  Pain Orientation: Left  Pain Frequency: Continuous  Pain Intervention(s): Rest(RN aware)      Objective:  Observation of Patient/Vital Signs:    BP 135/58    Pulse 72    Temp 98.6 F (37 C) (Oral)    Resp 17    Ht 1.575 m (5\' 2" )    Wt 111.9 kg (246 lb 11.1 oz)    LMP  (LMP Unknown) Comment: post menopausal   SpO2 97%    BMI 45.12 kg/m       Cognition/Neuro Status  Arousal/Alertness: Appropriate responses to stimuli  Attention Span: Appears  intact  Orientation Level: Oriented to place;Oriented to situation;Oriented to person  Memory: Appears intact  Following Commands: Follows all commands and directions without difficulty  Safety Awareness: minimal verbal instruction  Behavior: calm;cooperative  Motor Planning: intact    Functional Mobility  Scooting Transfers: Contact Guard Assist(to HOB, trendelenburg)  Sit to Supine Transfers: Minimal Assist  Sit to Stand Transfers: Minimal Assist(from chair)  Stand to Sit Transfers: Minimal Assist  Functional Mobility/Ambulation: Contact Guard Assist;Moderate Assist(w/ RW, MOD A X2 LOB w/ ambulation 2/2 dizziness)    Self-care and Home Management  UB Dressing: Minimal Assist  LB Dressing: Dependent(Pt reports use of sock aid at home)  Toileting: Moderate Assist(standing peri care)  Functional Transfers: Minimal Assist;toilet transfer(w/ RW)                                    Educated the patient to role of occupational therapy, plan of care, goals of therapy and safety with mobility and ADLs, discharge instructions with verbalized understanding.     Patient left in  bed with alarm and all other medical equipment in place and call bell and all personal items/needs within reach.  RN notified of session outcome.      Assessment:  Patient demonstrates good progress toward functional goals with improved activity tolerance despite continued report of pain. Pt requires steadying A for functional mobility 2/2 dizziness and decreased dynamic balance. Pt will continue to benefit from OT interventions to address the above deficits, maximize safety in ADL and functional mobility. Continue to recommend SNF post [place].          PMP Activity: Step 6 - Walks in Room  Distance Walked (ft) (Step 6,7): 0 Feet      Plan:  Goal Formulation: Patient      Time For Goal Achievement: by time of discharge  ADL Goals  Patient will dress lower body: Moderate Assist, by time of discharge, Not met  Pt will complete bathing: Contact Guard Assist,  by time of discharge, Partly met  Mobility and Transfer Goals  Pt will transfer bed to [facility]: Minimal Assist, Goal met(transfers to toilet w/ MIN A)  Pt will transfer bed to toilet: New goal, Supervision, with rolling walker, Not met  Neuro Re-Ed Goals  Pt will perform dynamic sitting balance: Modified Independent, by time of discharge, Goal met                    Continue plan of care.       DME Recommended for Discharge: Grab bars, Tub transfer bench, Front wheel walker, Medical alert system  Discharge Recommendation: SNF      [place], [place], [place] [phone]  06/04/2019 10:20 AM

## 2019-06-04 NOTE — Discharge Instr - Activity (Signed)
As tolerated

## 2019-06-04 NOTE — Progress Notes (Signed)
CM made follow up with pt. Regarding D/C Planning.  Pt is interested in [facility].  CM is awaiting response from SNF and will also coordinate with Dialysis Liaison.

## 2019-06-04 NOTE — Plan of Care (Addendum)
Pt is aox4.   Sat on bed side chair for most of the shift.   D/c order placed this shift. Following up with CM for SNF placement.  Will continue to monitor pt.     Problem: Altered GI Function  Goal: Fluid and electrolyte balance are achieved/maintained  Outcome: Progressing  Flowsheets (Taken 06/01/2019 1534 by Jory Sims, RN)  Fluid and electrolyte balance are achieved/maintained:   Monitor intake and output every shift   Provide adequate hydration   Assess and reassess fluid and electrolyte status   Monitor/assess lab values and report abnormal values   Monitor daily weight   Monitor for muscle weakness   Observe for cardiac arrhythmias     Problem: Renal Instability  Goal: Fluid and electrolyte balance are achieved/maintained  Outcome: Progressing  Flowsheets (Taken 06/04/2019 0414 by Candice Camp, RN)  Fluid and electrolyte balance are achieved/maintained:   Monitor for muscle weakness   Observe for seizure activity and initiate seizure precautions if indicated   Assess and reassess fluid and electrolyte status   Provide adequate hydration

## 2019-06-04 NOTE — Progress Notes (Signed)
HOSPITALIST PROGRESS NOTE      Patient: [name]  Date: 06/04/2019    LOS: 6 Days  Admission Date: 05/27/2019   MRN: [id number]  Attending: Delfina Redwood  MD, MPH                    Please contact via Secure Chat                      ASSESSMENT/PLAN     [name] is a 64 y.o. female admitted with Generalized abdominal pain    Interval Summary:     Active Hospital Problems    Diagnosis    Bowel incontinence    Thrombocytopenia    Hypotension    Type 2 diabetes mellitus, with long-term current use of insulin    GERD (gastroesophageal reflux disease)    Generalized abdominal pain    ESRD (end stage renal disease) on dialysis    Peritonitis    Peritoneal dialysis catheter dysfunction, initial encounter       Patient Active Hospital Problem List:   Generalized abdominal pain (05/27/2019)   Peritoneal dialysis catheter dysfunction, initial encounter (05/12/2019)   Peritonitis (05/24/2019)    Assessment: PD catheter removed by general surgery with exploratory laparotomy on 05/28/2019. Pleural fluid growing Pseudomonas and E. Coli.  ID has been following.    Plan: Appreciate ID inputs  Switched from Zosyn to oral Cipro prior to discharge to skilled nursing facility   Oral vancomycin 125 mg every 6 hours has been continued on account of loose stools  incentive spirometry use encouraged   Per ID p.o. Cipro and p.o. vancomycin to continue to 5/19.  Stopped IV morphine  Additional pain regimen as below.     Bowel incontinence   Assessment-unclear if this is a complication of prior back injury; however this problem is chronic and has been going on for the past year  Plan-minimize risk for bedsores with frequent turning and quick response to any accidents     ESRD (end stage renal disease) on dialysis (05/24/2019)    Assessment: Patient was on PD prior to admission; she has been switched to hemodialysis for now; outpatient HD has been arranged for    Plan: Appreciate nephrology inputs    Outpatient dialysis  to continue on a Tuesday Thursday Saturday schedule     Thrombocytopenia (05/31/2019)    Assessment: Could be due to the acute infection as above; platelet counts are stable    Plan: Okay to continue heparin for DVT prophylaxis  Monitor daily CBC     Hypotension (05/31/2019)    Assessment: Likely due to the infection above    Plan: Antibiotics as above     Type 2 diabetes mellitus, with long-term current use of insulin (05/31/2019)    Assessment: Chronic and unknown control    Plan: Continue with insulin as above     GERD (gastroesophageal reflux disease) (05/31/2019)    Assessment: Chronic and unknown control    Plan: c/w home dose of protonix   started maalox       Analgesia: continue Tylenol 1 g every 6 hours; reduced dose of gabapentin to 300 mg every night; continue oxycodone 5 to 10 mg every 6 hours as needed; stopped IV morphine    Nutrition: Consistent carb plus renal    GI Prophylaxis: None    DVT Prophylaxis: Heparin    Code Status: DNR/allow natural death    DISPO: discharge to skilled  nursing facility on 5/12.  Awaiting bed, likely on 5/13 after hemodialysis       [name] states that abdominal pain is better today.  She reports that she was able to sit up on the chair all day and also able to walk to the bathroom unassisted.    MEDICATIONS     Current Facility-Administered Medications   Medication Dose Route Frequency    acetaminophen  1,000 mg Oral 4 times per day    amLODIPine  5 mg Oral Q12H    atorvastatin  40 mg Oral Daily    carvedilol  25 mg Oral Q12H SCH    darbepoetin alfa  60 mcg Subcutaneous Weekly    docusate sodium  100 mg Oral Daily    gabapentin  300 mg Oral Daily    heparin (porcine)  5,000 Units Subcutaneous Q12H [facility]    insulin lispro  1-8 Units Subcutaneous TID AC    lactobacillus/streptococcus  1 capsule Oral Daily    levoFLOXacin  250 mg Oral Q48H    pantoprazole  40 mg Oral QAM AC    sevelamer  800 mg Oral TID MEALS    vancomycin  125 mg Oral Q6H     zinc Oxide   Topical Q6H       PHYSICAL EXAM     Vitals:    06/04/19 1652   BP: 122/71   Pulse: 68   Resp: 17   Temp: 97.3 F (36.3 C)   SpO2: 96%       Temperature: Temp  Min: 97.3 F (36.3 C)  Max: 98.6 F (37 C)  Pulse: Pulse  Min: 68  Max: 79  Respiratory: Resp  Min: 15  Max: 17  Non-Invasive BP: BP  Min: 98/63  Max: 135/58  Pulse Oximetry SpO2  Min: 94 %  Max: 100 %    Intake and Output Summary (Last 24 hours) at Date Time    Intake/Output Summary (Last 24 hours) at 06/04/2019 1730  Last data filed at 06/04/2019 1312  Gross per 24 hour   Intake 1260 ml   Output 0 ml   Net 1260 ml     Physical Exam   Constitutional: She is oriented to person, place, and time. No distress.   In mild distress due to abdominal tenderness during palpation   HENT:   Head: Normocephalic and atraumatic.   Right Ear: External ear normal.   Left Ear: External ear normal.   Mouth/Throat: Oropharynx is clear and moist.   Eyes: Pupils are equal, round, and reactive to light.   Neck: No JVD present. No tracheal deviation present. No thyromegaly present.   Cardiovascular: Normal rate, regular rhythm, normal heart sounds and intact distal pulses. Exam reveals no gallop and no friction rub.   No murmur heard.  Pulmonary/Chest: Effort normal and breath sounds normal. No stridor. No respiratory distress. She has no wheezes. She has no rales. She exhibits no tenderness.   Abdominal: Soft. Bowel sounds are normal. She exhibits no distension and no mass. There is abdominal tenderness. There is no rebound and no guarding.   Musculoskeletal:         General: No tenderness, deformity or edema. Normal range of motion.      Cervical back: Normal range of motion and neck supple.   Lymphadenopathy:     She has no cervical adenopathy.   Neurological: She is alert and oriented to person, place, and time. She has normal  reflexes. No cranial nerve deficit. She exhibits normal muscle tone. Gait normal. Coordination normal. GCS score is 15.   Skin: Skin is warm  and dry. No rash noted. She is not diaphoretic. No erythema. No pallor.   Tunneled dialysis catheter in place on the right chest wall   Psychiatric: Mood, memory, affect and judgment normal.       LABS     Recent Labs   Lab 06/04/19  0536 06/03/19  0954 06/02/19  0345   WBC 9.82* 8.18 9.29   RBC 2.67* 2.90* 2.53*   Hgb 7.6* 8.3* 7.2*   Hematocrit 26.5* 28.6* 25.3*   MCV 99.3* 98.6* 100.0*   Platelets 170 204 201       Recent Labs   Lab 06/04/19  0536 06/02/19  0345 05/30/19  0311 05/29/19  1302   Sodium 137 137 139 136   Potassium 4.2 4.8 3.7 4.0   Chloride 102 103 101 102   CO2 24 23 26 26    BUN 23.0* 28.0* 20.0* 29.0*   Creatinine 5.1* 5.8* 4.3* 5.0*   Glucose 263* 174* 112* 146*   Calcium 7.3* 7.0* 7.0* 6.9*       Recent Labs   Lab 05/30/19  0311 05/29/19  1302   ALT <6 <6   AST (SGOT) 10 10   Bilirubin, Total 0.3 0.2   Albumin 1.4* 1.4*   Alkaline Phosphatase 99 89                   Microbiology Results     Procedure Component Value Units Date/Time    Anaerobic culture [[id number]] Collected: 05/27/19 2343    Specimen: Other from Drainage Updated: 05/31/19 1205    Narrative:      blue swab  ORDER#: [id number]                                    ORDERED BY: Kalman Jewels  SOURCE: Drainage peritoneal fluid                    COLLECTED:  05/27/19 23:43  ANTIBIOTICS AT COLL.:                                RECEIVED :  05/28/19 07:56  ORDER ENTRY COMMENTS:  blue swab  Culture, Anaerobic Bacteria                PRELIM      05/31/19 12:05  05/31/19   No growth to date, final report to follow      Anaerobic culture [[id number]] Collected: 05/27/19 2343    Specimen: Other from Wound Updated: 05/31/19 1205    Narrative:      ORDER#: [id number]                                    ORDERED BY: Kalman Jewels  SOURCE: Wound abdominal wound                        COLLECTED:  05/27/19 23:43  ANTIBIOTICS AT COLL.:                                RECEIVED :  05/28/19 07:56  Culture, Anaerobic Bacteria                PRELIM       05/31/19 12:05  05/31/19   No growth to date, final report to follow      Blood Culture Aerobic/Anaerobic #1 [[id number]] Collected: 05/27/19 0507    Specimen: Arm from Blood, Venipuncture Updated: 05/31/19 1021    Narrative:      ORDER#: [id number]                                    ORDERED BY: CHUNG, SORA  SOURCE: Blood, Venipuncture arm                      COLLECTED:  05/27/19 05:07  ANTIBIOTICS AT COLL.:                                RECEIVED :  05/27/19 09:50  Culture Blood Aerobic and Anaerobic        PRELIM      05/31/19 10:21  05/28/19   No Growth after 1 day/s of incubation.  05/29/19   No Growth after 2 day/s of incubation.  05/30/19   No Growth after 3 day/s of incubation.  05/31/19   No Growth after 4 day/s of incubation.      Blood Culture Aerobic/Anaerobic #2 [[id number]] Collected: 05/27/19 0507    Specimen: Arm from Blood, Venipuncture Updated: 05/31/19 1021    Narrative:      ORDER#: [id number]                                    ORDERED BY: CHUNG, SORA  SOURCE: Blood, Venipuncture arm                      COLLECTED:  05/27/19 05:07  ANTIBIOTICS AT COLL.:                                RECEIVED :  05/27/19 09:50  Culture Blood Aerobic and Anaerobic        PRELIM      05/31/19 10:21  05/28/19   No Growth after 1 day/s of incubation.  05/29/19   No Growth after 2 day/s of incubation.  05/30/19   No Growth after 3 day/s of incubation.  05/31/19   No Growth after 4 day/s of incubation.      COVID-19 (SARS-COV-2) [employer] [facility]) [[id number]] Collected: 05/27/19 0624    Specimen: Nasopharyngeal Swab from Nasopharynx Updated: 05/27/19 0656     Purpose of COVID testing Screening     SARS-CoV-2 Specimen Source Nasopharyngeal     SARS CoV 2 Overall Result Negative     Comment: Test performed using the Abbott ID NOW EUA assay.  Please see Fact Sheets for patients and providers located at:  http://[facility]-hall.info/  This test is for the qualitative detection of SARS-CoV-2  (COVID19) nucleic acid. Viral  nucleic acids may persist in vivo,  independent of viability. Detection of viral nucleic acid does  not imply the presence of infectious virus, or that virus  nucleic acid is the cause of clinical symptoms. Negative  results should be treated as presumptive and, if  inconsistent  with clinical signs and symptoms or necessary for patient  management, should be tested with an alternative molecular  assay. Negative results do not preclude SARS-CoV-2 infection  and should not be used as the sole basis for patient  management decisions. Invalid results may be due to inhibiting  substances in the specimen and recollection should occur.         Narrative:      o Collect and clearly label specimen type:  o Upper respiratory specimen: One Nasopharyngeal Dry Swab NO  Transport Media.  o Hand deliver to laboratory ASAP  Indication for testing->Extended care facility admission to  semi private room    Culture + Gram Lenise Arena, Body Fluid [[id number]] Collected: 05/27/19 0534    Specimen: Body Fluid from Paracentesis Fluid Updated: 05/31/19 1428    Narrative:      [id number] called Micro Results of Gram Stain. Results read back by: [id number], by  [id number] on 05/27/2019 at 14:31  < 19ml  ORDER#: [id number]                                    ORDERED BY: CHUNG, SORA  SOURCE: Paracentesis Fluid peritoneal dialysis cathetCOLLECTED:  05/27/19 05:34  ANTIBIOTICS AT COLL.:                                RECEIVED :  05/27/19 10:09  ORDER ENTRY COMMENTS:  < 75ml  [id number] called Micro Results of Gram Stain. Results read back by: [id number], by [id number] on 05/27/2019 at 14:31  Stain, Gram                                FINAL       05/27/19 14:31  05/27/19   No Squamous epithelial cells seen             No WBCs seen             Rare Gram positive cocci             Stain performed on Cytospin (concentrated) specimen  Culture and Gram Stain, Aerobic, Body FluidFINAL       05/31/19 14:28  05/30/19   The volume of sample received with this order is less than              the recommended volume. Submission of low volumes may             adversely affect recovery of pathogens and culture results             may be compromised.  05/31/19   No growth      Fungus culture [[id number]] Collected: 05/27/19 0259    Specimen: Other from Drainage Updated: 05/29/19 1220    Narrative:      blue swab  ORDER#: [id number]                                    ORDERED BY: Kalman Jewels  SOURCE: Drainage peritoneal fluid                    COLLECTED:  05/27/19 02:59  ANTIBIOTICS AT COLL.:  RECEIVED :  05/28/19 07:56  ORDER ENTRY COMMENTS:  blue swab  Stain, Fungal                              FINAL       05/29/19 12:20  05/29/19   No Fungal or Yeast Elements Seen  Culture Fungus                             PENDING      Fungus culture [[id number]] Collected: 05/27/19 2343    Specimen: Other from Wound Updated: 05/29/19 1220    Narrative:      ORDER#: [id number]                                    ORDERED BY: Kalman Jewels  SOURCE: Wound abdominal wound                        COLLECTED:  05/27/19 23:43  ANTIBIOTICS AT COLL.:                                RECEIVED :  05/28/19 07:56  Stain, Fungal                              FINAL       05/29/19 12:20  05/29/19   No Fungal or Yeast Elements Seen  Culture Fungus                             PENDING      Wound culture & gram stain [[id number]] Collected: 05/27/19 2343    Specimen: Wound Updated: 05/30/19 0829    Narrative:      blue swab  ORDER#: [id number]                                    ORDERED BY: Kalman Jewels  SOURCE: Wound peritoneal fluid                       COLLECTED:  05/27/19 23:43  ANTIBIOTICS AT COLL.:                                RECEIVED :  05/28/19 07:56  ORDER ENTRY COMMENTS:  blue swab  Stain, Gram                                FINAL       05/28/19 13:06  05/28/19   Few WBCs             Rare Squamous epithelial cells             No organisms seen  Culture and Gram Stain, Aerobic, Wound     FINAL        05/30/19 08:29   +  05/29/19   Light growth of Pseudomonas aeruginosa  Refer to susceptibilities on culture #[id number]        Wound culture & gram stain [[id number]] Collected: 05/27/19 0221    Specimen: Wound Updated: 05/30/19 0828    Narrative:      ORDER#: [id number]                                    ORDERED BY: Kalman Jewels  SOURCE: Wound abdominal wound                        COLLECTED:  05/27/19 02:21  ANTIBIOTICS AT COLL.:                                RECEIVED :  05/28/19 07:56  Stain, Gram                                FINAL       05/28/19 13:03  05/28/19   Moderate WBCs             Few Squamous epithelial cells             No organisms seen  Culture and Gram Stain, Aerobic, Wound     FINAL       05/30/19 08:28   +  05/30/19   Light growth of Pseudomonas aeruginosa      _____________________________________________________________________________                                  P.aeruginosa    ANTIBIOTICS                     MIC  INTRP      _____________________________________________________________________________  Amikacin                        <=8    S        Aztreonam                        8     S        Cefepime                         2     S        Ceftazidime                     <=2    S        Ciprofloxacin                 <=0.25   S        Gentamicin                      <=2    S        Levofloxacin                   <=0.5   S        Meropenem                      <=0.5   S  Piperacillin/Tazobactam         4/4    S        Tobramycin                      <=2    S        _____________________________________________________________________________            S=SUSCEPTIBLE     I=INTERMEDIATE     R=RESISTANT                            N/S=NON-SUSCEPTIBLE  _____________________________________________________________________________             No results found.    Echo Results     None          I have personally reviewed the patient's labs, medications, and imaging    Total  visit time = 35 mins ; more than 50% spent counseling/coordinating care    Signed,    Reynaldo Minium, MD, MPH  5:30 PM 06/04/2019   Please contact via Secure Chat          This chart was generated using a medical voice-recognition software which does not employ spell-checking or grammar-checking features. It was dictated, all or in part, in a busy and often noisy patient care environment. I have taken all usual measures to dictate carefully and to review all aspects of this chart. Nonetheless, given the known and well-documented performance characteristics of voice recognition software in such patient care environments, this dictation still may contain unrecognized and wholly unintended errors or omissions.

## 2019-06-05 LAB — GLUCOSE WHOLE BLOOD - POCT
Whole Blood Glucose POCT: 257 mg/dL — ABNORMAL HIGH (ref 70–100)
Whole Blood Glucose POCT: 155 mg/dL — ABNORMAL HIGH (ref 70–100)
Whole Blood Glucose POCT: 240 mg/dL — ABNORMAL HIGH (ref 70–100)
Whole Blood Glucose POCT: 232 mg/dL — ABNORMAL HIGH (ref 70–100)

## 2019-06-05 MED ORDER — SODIUM CHLORIDE 0.9 % IV BOLUS
100.0000 mL | INTRAVENOUS | Status: AC | PRN
Start: 2019-06-05 — End: 2019-06-05

## 2019-06-05 MED ORDER — INSULIN GLARGINE 100 UNIT/ML SC SOLN
5.00 [IU] | Freq: Two times a day (BID) | SUBCUTANEOUS | Status: DC
Start: 2019-06-05 — End: 2019-06-07

## 2019-06-05 MED ORDER — SODIUM CHLORIDE 0.9 % IV BOLUS
250.0000 mL | INTRAVENOUS | Status: AC | PRN
Start: 2019-06-05 — End: 2019-06-05

## 2019-06-05 MED ORDER — ALBUMIN HUMAN 25 % IV SOLN
100.0000 mL | INTRAVENOUS | Status: AC | PRN
Start: 2019-06-05 — End: 2019-06-05
  Filled 2019-06-05: qty 100

## 2019-06-05 MED ORDER — DEXTROSE 50 % IV SOLN
12.50 g | INTRAVENOUS | Status: DC | PRN
Start: 2019-06-05 — End: 2019-06-13

## 2019-06-05 MED ORDER — GLUCOSE 40 % PO GEL
15.00 g | ORAL | Status: DC | PRN
Start: 2019-06-05 — End: 2019-06-13

## 2019-06-05 MED ORDER — INSULIN GLARGINE 100 UNIT/ML SC SOLN
5.00 [IU] | Freq: Two times a day (BID) | SUBCUTANEOUS | Status: DC
Start: 2019-06-05 — End: 2019-06-13
  Administered 2019-06-05 – 2019-06-13 (×17): 5 [IU] via SUBCUTANEOUS
  Filled 2019-06-05 (×17): qty 5

## 2019-06-05 MED ORDER — GLUCAGON 1 MG IJ SOLR (WRAP)
1.00 mg | INTRAMUSCULAR | Status: DC | PRN
Start: 2019-06-05 — End: 2019-06-13

## 2019-06-05 NOTE — Progress Notes (Signed)
Updates as follows: [facility] is able to accept Pt. If she surrender her Monthly SS Check of $800.00. [facility] is most appropriate because pt can also do her HD while there for rehab.  After explaining the details of Pt's Medicaid Insurance and her requirement for surrendering her monthly SS Check for the duration of her Rehab, Pt declined SNF and stated she is not able to surrender her monthly SS income because she uses that money to pay her monthly expenses.  CM inquired about additional supports to include her daughter and husband.  Per pt. Her daughter is not abel to pay her bills while in rehab and her husband is currently separated from her and has his own financial responsibilities. CM left messages for Admission staff at [facility]. And [facility].  Awaiting feedback and will continue to follow.

## 2019-06-05 NOTE — Plan of Care (Signed)
Able to walk but did not get up this shift. Abdominal pain controlled with scheduled tylenol. Denies any shortness of breath or cough. Encouraged to use IS while awake. Dialysis access intact. Dressing clean and dry. Abdominal incision with staples, no drainage noted. Skin care provided to perineal and groin area as ordered. Fall precautions maintained.        Problem: Moderate/High Fall Risk Score >5  Goal: Patient will remain free of falls  Outcome: Progressing  Flowsheets (Taken 06/04/2019 2019)  Moderate Risk (6-13):   MOD-Use gait belt when appropriate   MOD-Perform dangle, stand, walk (DSW) when getting patient up   MOD-Place bedside commode and assistive devices out of sight when not in use   MOD-Remain with patient during toileting   MOD-Floor mat at bedside (where available) if appropriate   MOD-Consider activation of bed alarm if appropriate   LOW-Fall Interventions Appropriate for Low Fall Risk   LOW-Anticoagulation education for injury risk     Problem: Safety  Goal: Patient will be free from injury during hospitalization  Outcome: Progressing  Flowsheets (Taken 06/05/2019 0452)  Patient will be free from injury during hospitalization:   Assess patient's risk for falls and implement fall prevention plan of care per policy   Provide and maintain safe environment   Hourly rounding  Goal: Patient will be free from infection during hospitalization  Outcome: Progressing  Flowsheets (Taken 06/05/2019 0452)  Free from Infection during hospitalization:   Assess and monitor for signs and symptoms of infection   Monitor all insertion sites (i.e. indwelling lines, tubes, urinary catheters, and drains)   Encourage patient and family to use good hand hygiene technique     Problem: Pain  Goal: Pain at adequate level as identified by patient  Outcome: Progressing  Flowsheets (Taken 06/05/2019 0452)  Pain at adequate level as identified by patient:   Identify patient comfort function goal   Assess pain on  admission, during daily assessment and/or before any "as needed" intervention(s)   Reassess pain within 30-60 minutes of any procedure/intervention, per Pain Assessment, Intervention, Reassessment (AIR) Cycle   Evaluate if patient comfort function goal is met   Evaluate patient's satisfaction with pain management progress   Offer non-pharmacological pain management interventions     Problem: Compromised Tissue integrity  Goal: Damaged tissue is healing and protected  Outcome: Progressing  Flowsheets (Taken 06/05/2019 0452)  Damaged tissue is healing and protected:   Monitor/assess Braden scale every shift   Keep intact skin clean and dry   Monitor external devices/tubes for correct placement to prevent pressure, friction and shearing   Use bath wipes, not soap and water, for daily bathing   Monitor patient's hygiene practices     Problem: Renal Instability  Goal: Free from infection  Outcome: Progressing  Flowsheets (Taken 06/05/2019 0452)  Free from infection: Monitor/assess for signs and symptoms of infection     Problem: Patient Receiving Advanced Renal Therapies  Goal: Therapy access site remains intact  Outcome: Progressing  Flowsheets (Taken 06/05/2019 0452)  Therapy access site remains intact: Assess therapy access site

## 2019-06-05 NOTE — Progress Notes (Signed)
[facility] PROGRESS NOTE  Aaron Edelman, x [phone] [facility])      Date Time: 06/05/19 7:35 AM  Patient Name: [name]  Attending Physician: Delfina Redwood, MD    CC: follow-up ESRD    Assessment:     1. ESRD- previously on PD; switched to HD on  5/3   2. PD associated peritonitis - growing Pseudomonas and E. Coli, antibiotics change to zosyn --> po levoquin  - S/p PD catheter removal on 05/27/19  3. 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 Secondary hyperparathyroidism     Recommendations:     Hemodialysis today and TTS  Will follow as outpatient    Stable from renal standpoint for SNF      Case discussed with: pt and HD nurse    Armando Reichert, MD  [facility]  [phone] (office)  X [phone] (FFX Spectra-Link)    Subjective: Has mild abdominal pain but otherwise feels well    Review of Systems:   Cardio:no chest pain  Pulm:no SOB  ZW:CHENIDPOEU at incision site    Physical Exam:     Vitals:    06/04/19 2309 06/05/19 0111 06/05/19 0418 06/05/19 0727   BP: 138/76 138/76 135/85 130/75   Pulse: 72  71 70   Resp: 16  16 18    Temp: 97.3 F (36.3 C)  97.9 F (36.6 C) 97.7 F (36.5 C)   TempSrc: Oral  Oral Oral   SpO2: 99%  99% 97%   Weight:   111.2 kg (245 lb 2.4 oz)    Height:           Intake and Output Summary (Last 24 hours) at Date Time    Intake/Output Summary (Last 24 hours) at 06/05/2019 0735  Last data filed at 06/05/2019 0418  Gross per 24 hour   Intake 810 ml   Output 0 ml   Net 810 ml       General: awake, alert, oriented x 3, no acute distress.  HEENT: sclera anicteric  Neck: supple  Cardiovascular: regular rate and rhythm  Lungs: clear to auscultation bilaterally, bilateral air entry, normal work of breathing  Abdomen: soft, very tender to light palpation,erythema around the LLQ incision site  Extremities: + LE edema  Neuro: A+O x 3, no gross motor/sensory deficits    Access R TDC    Meds:      Scheduled Meds: PRN Meds:    acetaminophen, 1,000  mg, Oral, 4 times per day  amLODIPine, 5 mg, Oral, Q12H  atorvastatin, 40 mg, Oral, Daily  carvedilol, 25 mg, Oral, Q12H SCH  darbepoetin alfa, 60 mcg, Subcutaneous, Weekly  docusate sodium, 100 mg, Oral, Daily  gabapentin, 300 mg, Oral, Daily  heparin (porcine), 5,000 Units, Subcutaneous, Q12H SCH  insulin lispro, 1-8 Units, Subcutaneous, TID AC  lactobacillus/streptococcus, 1 capsule, Oral, Daily  levoFLOXacin, 250 mg, Oral, Q48H  pantoprazole, 40 mg, Oral, QAM AC  sevelamer, 800 mg, Oral, TID MEALS  vancomycin, 125 mg, Oral, Q6H  zinc Oxide, , Topical, Q6H          Continuous Infusions:   sodium chloride 20 mL/hr at 05/27/19 1100    sodium chloride 15 mL/hr at 06/02/19 2057    lactated ringers      sodium chloride, , PRN  bisacodyl, 10 mg, Daily PRN  dextrose, 15 g of glucose, PRN   And  dextrose, 12.5 g, PRN   And  glucagon (rDNA), 1 mg, PRN  diphenhydrAMINE, 25 mg, Q6H PRN  guaiFENesin-dextromethorphan, 5 mL, Q4H PRN  naloxone, 0.2 mg, PRN  ondansetron, 4 mg, Once PRN  ondansetron, 4 mg, Q6H PRN   Or  ondansetron, 4 mg, Q6H PRN  oxyCODONE, 5 mg, Q6H PRN   Or  oxyCODONE, 10 mg, Q6H PRN  petrolatum, , PRN              Labs:     Recent Labs   Lab 06/04/19  0536 06/03/19  0954 06/02/19  0345   WBC 9.82* 8.18 9.29   Hgb 7.6* 8.3* 7.2*   Hematocrit 26.5* 28.6* 25.3*   Platelets 170 204 201     Recent Labs   Lab 06/04/19  0536 06/03/19  0954 06/02/19  0345 05/30/19  0311 05/29/19  1302 05/29/19  1302   Sodium 137  --  137 139  More results in Results Review 136   Potassium 4.2  --  4.8 3.7  More results in Results Review 4.0   Chloride 102  --  103 101  More results in Results Review 102   CO2 24  --  23 26  More results in Results Review 26   BUN 23.0*  --  28.0* 20.0*  More results in Results Review 29.0*   Creatinine 5.1*  --  5.8* 4.3*  More results in Results Review 5.0*   Calcium 7.3*  --  7.0* 7.0*  More results in Results Review 6.9*   Albumin  --   --   --  1.4*  --  1.4*   Phosphorus  --  3.8  --   --    --   --    Glucose 263*  --  174* 112*  More results in Results Review 146*   EGFR 10.3  --  8.9 12.5  More results in Results Review 10.5   More results in Results Review = values in this interval not displayed.         Signed by: Armando Reichert, MD

## 2019-06-05 NOTE — Progress Notes (Signed)
3.5Hrs HD tx. Completed uneventful; Able to achieve 3L(net) fluid removal; VSS throughout HD. Rt. CVC has no flow issues. Pt. Otherwise stable. Report given to April F. Hernandez RN.     06/05/19 1314   Treatment Summary   Time Off Machine 1300   Duration of Treatment (Hours) 3.5   Dialyzer Clearance Moderately streaked   Fluid Volume Off (mL) 3400   Prime Volume (mL) 200   Rinseback Volume (mL) 200   Fluid Given: Normal Saline (mL) 0   Fluid Given: PRBC  0 mL   Fluid Given: Albumin (mL) 0   Fluid Given: Other (mL) 0   Total Fluid Given 400   Hemodialysis Net Fluid Removed 3000   Post Treatment Assessment   Post-Treatment Weight (Kg) 107   Patient Response to Treatment tolerated well   Additional Dialyzer Used 0   Permacath/Temporary Catheter 05/23/19 Permacath Right   Placement Date/Time: 05/23/19 1342   Inserted by: Papadouris  Access Type: Permacath  Orientation: Right  Access Location: Tunneled, Chest  Catheter Tip Location: svc  Central Line Infection Prevention Education provided?: Yes;No  Hand Hygiene: [place]...   Catheter Lumen Volume Venous 1.7 mL   Catheter Lumen Volume Arterial 1.7 mL   Dressing Status and Intervention Dressing Intact   Tego Caps on Catheter Yes   NEW Tego Caps placed (Date) 06/05/19   Vitals   Temp 97.8 F (36.6 C)   Heart Rate 62   Resp Rate 18   SpO2 98 %   O2 Device None (Room air)   Assessment   Mental Status Alert;Oriented;Cooperative   Cardiac (WDL) WDL   Cardiac Regularity Regular   Cardiac Symptoms None   Cardiac Rhythm Normal Sinus Rhythm   Respiratory  WDL   Respiratory Pattern Regular   Bilateral Breath Sounds Clear   R Breath Sounds Clear   Edema  WDL   Generalized Edema None   Facial None   Sacral UTA   RLE Edema +1   LLE Edema +1   General Skin Color Appropriate for ethnicity   Skin Condition/Temp Warm;Dry   Gastrointestinal (WDL) WDL   Abdomen Inspection Nondistended   GI Symptoms None   Mobility Ambulatory with Assistance   Pain Assessment   Charting Type Assessment    Pain Scale Used Numeric Scale (0-10)   Numeric Pain Scale   Pain Score 0   POSS Score 1   Pain Location Abdomen   Education   Person taught Patient   Knowledge basis Minimal   Topics taught Access care   Teaching Tools Explain   [name] Understanding   Bedside Nurse Communication   Name of bedside RN - post dialysis [name]

## 2019-06-05 NOTE — Plan of Care (Signed)
Problem: Moderate/High Fall Risk Score >5  Goal: Patient will remain free of falls  Outcome: Progressing  Flowsheets (Taken 06/05/2019 1501)  Moderate Risk (6-13):   MOD-Remain with patient during toileting   MOD-Consider a move closer to [place]   MOD-Utilize diversion activities   MOD-Perform dangle, stand, walk (DSW) when getting patient up     Problem: Safety  Goal: Patient will be free from injury during hospitalization  Outcome: Progressing  Flowsheets (Taken 06/05/2019 1501)  Patient will be free from injury during hospitalization:   Assess patient's risk for falls and implement fall prevention plan of care per policy   Provide and maintain safe environment   Use appropriate transfer methods   Hourly rounding  Goal: Patient will be free from infection during hospitalization  Outcome: Progressing  Flowsheets (Taken 06/05/2019 1501)  Free from Infection during hospitalization:   Monitor lab/diagnostic results   Assess and monitor for signs and symptoms of infection   Monitor all insertion sites (i.e. indwelling lines, tubes, urinary catheters, and drains)     Problem: Pain  Goal: Pain at adequate level as identified by patient  Outcome: Progressing  Flowsheets (Taken 06/05/2019 1501)  Pain at adequate level as identified by patient:   Identify patient comfort function goal   Assess pain on admission, during daily assessment and/or before any "as needed" intervention(s)   Reassess pain within 30-60 minutes of any procedure/intervention, per Pain Assessment, Intervention, Reassessment (AIR) Cycle   Offer non-pharmacological pain management interventions   Consult/collaborate with Pain Service   Evaluate patient's satisfaction with pain management progress     Problem: Compromised Tissue integrity  Goal: Damaged tissue is healing and protected  Outcome: Progressing  Flowsheets (Taken 06/05/2019 1501)  Damaged tissue is healing and protected:   Monitor/assess Braden scale every shift   Provide wound care per  wound care algorithm   Keep intact skin clean and dry   Avoid shearing injuries   Use bath wipes, not soap and water, for daily bathing   Increase activity as tolerated/progressive mobility   Encourage use of lotion/moisturizer on skin   Monitor patient's hygiene practices     Problem: Renal Instability  Goal: Fluid and electrolyte balance are achieved/maintained  Outcome: Progressing  Flowsheets (Taken 06/05/2019 1501)  Fluid and electrolyte balance are achieved/maintained:   Monitor intake and output every shift   Monitor/assess lab values and report abnormal values   Provide adequate hydration   Assess and reassess fluid and electrolyte status   Monitor daily weight   Monitor for muscle weakness      Pt Alert and oriented x4. On room air. VSS. Denies shortness of breath, nausea and vomiting. Pt ambulates to the bathroom using walker with standby assist. BM noted during the shift. Pt has female external cath on. Pt complaints of pain and Tylenol was given. Blood sugar monitoring with insulin coverage. Encouraged use of incentive spirometer while awake. Dialysis access intact. Dressing clean and dry. Abdominal incision with staples no drainage noted. Skin provided to perineal and groin area per scheduled. Pt had hemodialysis today. Following with CM for SNF placement. Purposeful rounding done. Call bell and personal belongings are within reach.

## 2019-06-05 NOTE — Discharge Instr - AVS First Page (Addendum)
[facility] DISCHARGE INSTRUCTIONS     Date of Admission: 05/27/2019    Date of Discharge: 06/07/2019      Consultants: General surgery, infectious disease and nephrology    Pending Studies: None    Further Instructions: Please follow up with the providers/clinics listed below.     Surgery/Procedure: Laparotomy to remove PD catheter     Admission Diagnosis: Generalized abdominal pain [R10.84]  End stage renal disease on dialysis [N18.6, Z99.2]  Partial small bowel obstruction [K56.600]  Peritoneal dialysis catheter in place [Z99.2]  Vomiting and diarrhea [R11.10, R19.7]    Problem List  Active Hospital Problems    Diagnosis    Bowel incontinence    Thrombocytopenia    Hypotension    Type 2 diabetes mellitus, with long-term current use of insulin    GERD (gastroesophageal reflux disease)    Generalized abdominal pain    ESRD (end stage renal disease) on dialysis    Peritonitis    Peritoneal dialysis catheter dysfunction, initial encounter          Discharge Medications     Medication List        START taking these medications      lactobacillus/streptococcus Caps  Take 1 capsule by mouth daily     levoFLOXacin 250 MG tablet  Commonly known as: LEVAQUIN  Take 1 tablet (250 mg total) by mouth every other day for 6 days     oxyCODONE 5 MG immediate release tablet  Commonly known as: ROXICODONE  Take 1 tablet (5 mg total) by mouth every 6 (six) hours as needed for Pain     vancomycin HCl 250 MG/5ML oral solution  Commonly known as: FIRVANQ  Take 2.5 mLs (125 mg total) by mouth every 6 (six) hours for 7 days     zinc Oxide 40 % Pste paste  Commonly known as: DESITIN  Apply topically every 6 (six) hours            CHANGE how you take these medications      carvedilol 25 MG tablet  Commonly known as: COREG  Take 1 tablet (25 mg total) by mouth every 12 (twelve) hours  What changed: when to take this            CONTINUE taking these medications      amLODIPine 5 MG tablet  Commonly known as: NORVASC  Take 1  tablet (5 mg total) by mouth every 12 (twelve) hours     atorvastatin 40 MG tablet  Commonly known as: LIPITOR     famotidine 40 MG tablet  Commonly known as: PEPCID     gabapentin 600 MG tablet  Commonly known as: NEURONTIN     insulin aspart 100 UNIT/ML injection  Commonly known as: NovoLOG     insulin glargine 100 UNIT/ML injection  Commonly known as: LANTUS  Inject 10 Units into the skin every morning     pantoprazole 40 MG tablet  Commonly known as: PROTONIX  Take 1 tablet (40 mg total) by mouth 2 (two) times daily     sevelamer 800 MG tablet  Commonly known as: RENVELA     Trulicity 8.82 CM/0.3KJ Sopn  Generic drug: Dulaglutide  Notes to patient: Resume as outpatient schedule            STOP taking these medications      furosemide 40 MG tablet  Commonly known as: LASIX     oxyCODONE-acetaminophen 5-325 MG per tablet  Commonly known as: PERCOCET  Tresiba 100 UNIT/ML Soln  Generic drug: Insulin Degludec               Where to Get Your Medications        Information about where to get these medications is not yet available    Ask your nurse or doctor about these medications   carvedilol 25 MG tablet   lactobacillus/streptococcus Caps   levoFLOXacin 250 MG tablet   oxyCODONE 5 MG immediate release tablet   vancomycin HCl 250 MG/5ML oral solution   zinc Oxide 40 % Pste paste         Follow up Appointments  Follow-up Information       Herbert Moors., MD. Schedule an appointment as soon as possible for a visit in 10 day(s).    Specialty: Surgery  Why: for staple removal  Contact information:  [address]  [place] [address]  [phone]               Ulyses Amor, MD. Schedule an appointment as soon as possible for a visit in 1 week(s).    Specialties: Nephrology, Internal Medicine  Contact information:  [address]  [address] [address]  [phone]               Ernst Breach, MD. Schedule an appointment as soon as possible for a visit.     Specialties: Infectious Disease, Internal Medicine  Contact information:  [place] [address]  [phone]                       Call Your Doctor if you have:   Chest pain   Shortness of breath or difficulty breathing   Temperature greater than 100.4   Persistent nausea and vomiting   Severe uncontrolled pain   Signs of infection (pain, swelling, redness, tenderness, odor, or green/yellow discharge)   Headache or visual disturbances   Hives   Persistent dizziness or light-headedness   Extreme fatigue   Bleeding   Any other questions or concerns you may have after discharge   In an emergency, call 911 or go to an Emergency Department at a nearby hospital     Additional Instructions:   -Schedule a follow up appointment with your Primary Care Provider within 1 week of discharge.   -Carry a list of your medications and allergies with you at all times   -Resume your usual diet unless otherwise directed. Good nutrition promotes quicker healing.   -Call your pharmacy at least 1 week in advance to refill prescriptions     Information on [place] is given to help treat or prevent illness. But if you dont take it correctly, it might not help. It might even harm you. Your doctor or pharmacist can help you learn the right way to take your medicine. Listed below are some tips to help you take medicine safely.     Safety Tips   1. Have a routine for taking each medicine. Make it part of something you do each day, such as brushing your teeth or eating a meal.   2. When you go to the hospital or your doctors office, bring all your current medicines in their original boxes or bottles. If you cant do that, bring an up-to-date list of your medicines.   3. Do not stop taking a prescription medicine unless your  doctor tells you to. Doing so could make your condition worse.   4. Do not share medicines.   5. Let your doctor and pharmacist know of any allergies you have.   6. Taking  prescription medicines with alcohol, street drugs, herbs, supplements, or even some over-the-counter medicines can be harmful. Talk to your doctor or pharmacist before using any of these things while taking a prescription medcine.   7. When filling your prescriptions, try using the same pharmacy for all your medicines. If not, let the pharmacist know what medicines you are already taking.   8. Keep medicines out of the reach of children and pets. Store medicines in a cool, dry, dark place--not in the bathroom or in the kitchen near moisture or heat.   9. Do not use medicine that has expired or that doesnt look or smell right. Bring expired or old medicine to your pharmacy for disposal.     Using Generic Medicines   Medicines have brand names and generic (chemical) names. When a medicine is first made, it is sold only under its brand name. Later, it can be made and sold as a generic. Generic medicines cost less than brand-name medicines and most work just as well. Unless their doctor says otherwise, most people can use the generic medicine instead of the brand-name medicine.     Always follow your healthcare professional's instructions.     If you are unable to obtain an appointment, unable to obtain newly prescribed medications, or are unclear about any of your discharge instructions please contact me at [phone] (M-F, 8am-3pm) or weekends and after hours via the hospital operator [phone], the hospital case manager, or your primary care physician.    Finally, as your discharging physician, you may be receiving a survey which is regarding my care. I would greatly value and appreciate your feedback as I strive for excellence.     Respectfully yours,    Murlean Hark MD, MPH  [facility]  [facility] Discharge Information     Your doctor has ordered Skilled Nursing, Physical Therapy and Occupational Therapy  Wound care and dialysis patient in-home  service(s) for you while you recuperate at home, to assist you in the transition from hospital to home.    The agency that you or your representative chose to provide the service:  Name of [place] Placement: [place]:  Name of DME Agency: [facility] [employer]]([phone]  Equipment Ordered: Bedside Commode will be delivered to patient house.      The above services were set up by:  Cecille Amsterdam  ([place] Liaison)   Phone  [phone]    IF YOU HAVE NOT HEARD [place] 24-48 HOURS AFTER DISCHARGE PLEASE CALL YOUR AGENCY TO ARRANGE A TIME FOR YOUR FIRST VISIT. FOR ANY SCHEDULING CONCERNS OR QUESTIONS RELATED TO HOME HEALTH, SUCH AS TIME OR DATE PLEASE CONTACT YOUR HOME HEALTH AGENCY AT THE NUMBER LISTED ABOVE.

## 2019-06-05 NOTE — UM Notes (Addendum)
Primary Coverage: MEDICAID HMO/[place] COMPLETE CARE OF [place]  CONCURRENT REVIEW Jun 05, 2019    UTILIZATION REVIEW CONTACT : Shanon Payor BSN, RN, ACM  Utilization Review Case Manager II / Case Management  [facility]  [address]  [place], [place]  T [phone]  Jerilynn Mages [phone]   F [phone]    Email: [email or web address]  NPI: [id number] Tax ID:  [id number]    RECONSIDERATION    PATIENT NAME: [name],[name]  DOB: 30-Apr-1955    History of present illness: Pt is a 64 y.o. female arrived at [facility] on 05/27/2019 at 0433.and admitted to     Pt was discharged on 06/04/2019.  Still waiting on placement     BP 138/73    Pulse 67    Temp 98.2 F (36.8 C) (Oral)    Resp 16    Ht 1.575 m (5\' 2" )    Wt 111.2 kg (245 lb 2.4 oz)    LMP  (LMP Unknown) Comment: post menopausal   SpO2 97%    BMI 44.84 kg/m     Temp:  [97.3 F (36.3 C)-98.2 F (36.8 C)]   Heart Rate:  [60-72]   Resp Rate:  [15-21]   BP: (105-153)/(55-94)   SpO2:  [96 %-100 %]   Weight:  [111.2 kg (245 lb 2.4 oz)]     Last recorded pain score:  Pain Scale Used: Numeric Scale (0-10)  Pain Score: 7-severe pain         Abnormal Labs:   Lab Results last 48 Hours     Procedure Component Value Units Date/Time    Glucose Whole Blood - POCT [[id number]]  (Abnormal) Collected: 06/05/19 2041     Updated: 06/05/19 2110     Whole Blood Glucose POCT 232 mg/dL     Glucose Whole Blood - POCT [[id number]]  (Abnormal) Collected: 06/05/19 1553     Updated: 06/05/19 1557     Whole Blood Glucose POCT 240 mg/dL     Glucose Whole Blood - POCT [[id number]]  (Abnormal) Collected: 06/05/19 1335     Updated: 06/05/19 1339     Whole Blood Glucose POCT 155 mg/dL     Glucose Whole Blood - POCT [[id number]]  (Abnormal) Collected: 06/05/19 0726     Updated: 06/05/19 0824     Whole Blood Glucose POCT 257 mg/dL     Glucose Whole Blood - POCT [[id number]]  (Abnormal) Collected: 06/04/19 2115     Updated: 06/04/19 2122     Whole Blood Glucose POCT 263 mg/dL      Glucose Whole Blood - POCT [[id number]]  (Abnormal) Collected: 06/04/19 1709     Updated: 06/04/19 1711     Whole Blood Glucose POCT 177 mg/dL     Glucose Whole Blood - POCT [[id number]]  (Abnormal) Collected: 06/04/19 1135     Updated: 06/04/19 1138     Whole Blood Glucose POCT 236 mg/dL     Glucose Whole Blood - POCT [[id number]]  (Abnormal) Collected: 06/04/19 0746     Updated: 06/04/19 0748     Whole Blood Glucose POCT 266 mg/dL     Basic Metabolic Panel [[id number]]  (Abnormal) Collected: 06/04/19 0536    Specimen: Blood Updated: 06/04/19 0628     Glucose 263 mg/dL      BUN 23.0 mg/dL      Creatinine 5.1 mg/dL      Calcium 7.3 mg/dL      Sodium 137 mEq/Young      Potassium 4.2 mEq/Young  Chloride 102 mEq/Young      CO2 24 mEq/Young      Anion Gap 11.0    Hemolysis index [[id number]] Collected: 06/04/19 0536     Updated: 06/04/19 0628     Hemolysis Index 2    GFR [[id number]] Collected: 06/04/19 0536     Updated: 06/04/19 0628     EGFR 10.3    CBC without differential [[id number]]  (Abnormal) Collected: 06/04/19 0536    Specimen: Blood Updated: 06/04/19 0601     WBC 9.82 x10 3/uL      Hgb 7.6 g/dL      Hematocrit 26.5 %      Platelets 170 x10 3/uL      RBC 2.67 x10 6/uL      MCV 99.3 fL      MCH 28.5 pg      MCHC 28.7 g/dL      RDW 13 %      MPV 11.1 fL      Nucleated RBC 0.0 /100 WBC      Absolute NRBC 0.00 x10 3/uL           MD Notes:  Discharge Information   Admission Diagnosis:   Generalized abdominal pain    Discharge Diagnosis:       Active Hospital Problems    Diagnosis    Bowel incontinence    Thrombocytopenia    Hypotension    Type 2 diabetes mellitus, with long-term current use of insulin    GERD (gastroesophageal reflux disease)    Generalized abdominal pain    ESRD (end stage renal disease) on dialysis    Peritonitis    Peritoneal dialysis catheter dysfunction, initial encounter        Admission Condition: Guarded  Discharge Condition: Stable  Consultants: Infectious disease, surgery, nephrology   Functional Status: Ambulating  Discharged to: SNF  Procedures: PD catheter removal  Surgeries: PD catheter removal    Imaging:     CT Abd/Pelvis without Contrast    Result Date: 05/12/2019   INTRAPERITONEAL FLUID/ASCITES (? RELATED TO THE PERITONEAL DIALYSIS) Leonard Downing, MD  05/12/2019 9:42 PM    CT Angiogram Chest    Result Date: 05/18/2019     No evidence of pulmonary embolism. Small bilateral pleural effusions. Large hiatal hernia. Trace perihepatic ascites. Other nonurgent findings as above. Lonia Skinner, MD  05/18/2019 9:58 AM    CT Abd/Pelvis with IV Contrast only    Result Date: 05/27/2019  1. Fluid-filled mildly distended loops of small bowel with distal decompressed small bowel loops. Findings may represent early or partial small bowel obstruction. Enteritis is felt to be less likely. 2. Trace bilateral pleural effusions. 3. Additional chronic findings as above. Delaney Meigs, MD  05/27/2019 6:07 AM    Tunneled Cath Placement (Permcath)    Result Date: 05/23/2019   Technically successful placement of an  23cm Equistream catheter via the right internal jugular vein as described above. Phlebitic changes in the right internal jugular vein from prior catheter placement. Dimitrios Papadouris, MD  05/23/2019 3:51 PM        Hospital Course   Presentation History   Per. NP Darral Dash "Vanessa Kampf McKethanis a 64 y.o.femalewith a PMHx of hypertension, hyperlipidemia, insulin-dependent diabetes mellitus, COPD, end-stage renal disease on peritoneal dialysis  Recently switched to hemodialysis and discharged yesterdayreturnto the ED with recurrent abdominal pain associated with Non bloodydiarrhea, nausea vomiting of nonbilious and nonbloody vomitus. Patient states that she continued to have loose watery stools on arriving at home  yesterday followed by nausea vomiting 2-3 times, described abdominal pain as left-sided pain intensity greater than 10, pain is worse with palpation. She denies fever/chills, dysuria, chest pain,  shortness of breath, blood in stool or vomitus. She had dialysis yesterday prior to discharge and have not arranged transportation for outpatient dialysis yet.    In the emergency room, CT abdomen pelvis showed partial small bowel obstruction, trace bilateral pleural effusions, revealed WBC of 15.22, H&H of 10.4/34.1,stool studies pending, infectious disease Dr. Lamona Curl Surgery Dr. Myrna Blazer".    Hospital Course (7 Days):      Patient was admitted for PD peritonitis.  Peritoneal fluid grew Pseudomonas.  ID saw patient and recommended IV Zosyn which was deescalated to Cipro based on susceptibility.  ID recommends continuing antibiotics till 5/19.  Patient was also treated prophylactically for C. difficile.  P.o. vancomycin was used.  ID recommend continue p.o. vancomycin till 5/19.  Patient's PD catheter was removed and she was started on hemodialysis.  Outpatient hemodialysis was set up prior to discharge.  PT assessed patient and recommended SNF.  Case manager working on SNF placement.      Patient Active Hospital Problem List:  Generalized abdominal pain (05/27/2019)  Peritoneal dialysis catheter dysfunction, initial encounter (05/12/2019)  Peritonitis (05/24/2019)  Assessment: PD catheter removed by general surgery with exploratory laparotomy on 05/28/2019. Pleural fluid growing Pseudomonas and E. Coli. ID has been following.  Plan: Appreciate ID inputs  Switched from Zosyn to oral Cipro prior to discharge to skilled nursing facility   Oral vancomycin 125 mg every 6 hours has been continued on account of loose stools  incentive spirometry use encouraged   Per ID p.o. Cipro and p.o. vancomycin to continue to 5/19.  Stopped IV morphine  Additional pain regimen as below.     Bowel incontinence   Assessment-unclear if this is a complication of prior back injury; however this problem is chronic and has been going on for the past year  Plan-minimize risk for bedsores with frequent turning and quick  response to any accidents    ESRD (end stage renal disease) on dialysis (05/24/2019)  Assessment: Patient was on PD prior to admission; she has been switched to hemodialysis for now; outpatient HD has been arranged for  Plan: Appreciate nephrology inputs   Outpatient dialysis to continue on a Tuesday Thursday Saturday schedule    Thrombocytopenia (05/31/2019)  Assessment: Could be due to the acute infection as above; platelet counts are stable  Plan: Okay to continue heparin for DVT prophylaxis  Monitor daily CBC    Hypotension (05/31/2019)  Assessment: Likely due to the infection above  Plan: Antibiotics as above    Type 2 diabetes mellitus, with long-term current use of insulin (05/31/2019)  Assessment: Chronic and unknown control  Plan: Continue with insulin as above    GERD (gastroesophageal reflux disease) (05/31/2019)  Assessment: Chronic and unknown control  Plan: c/w home dose of protonix  started maalox     RECOMMENDATIONS:    - Patient was informed of abnormal and incidental imaging findings during hospitalization, and advised to review this information with their  medical provider.     Diagnostics     Labs/Studies Pending at Discharge:    Last Labs         Recent Labs   Lab 06/04/19  0536 06/03/19  0954 06/02/19  0345   WBC 9.82* 8.18 9.29   RBC 2.67* 2.90* 2.53*   Hgb 7.6* 8.3* 7.2*   Hematocrit 26.5* 28.6* 25.3*  MCV 99.3* 98.6* 100.0*   Platelets 170 204 201             Recent Labs   Lab 06/04/19  0536 06/02/19  0345 05/30/19  0311   Sodium 137 137 139   Potassium 4.2 4.8 3.7   Chloride 102 103 101   CO2 24 23 26    BUN 23.0* 28.0* 20.0*   Creatinine 5.1* 5.8* 4.3*   Glucose 263* 174* 112*   Calcium 7.3* 7.0* 7.0*                 Microbiology Results               Procedure Component Value Units Date/Time    Anaerobic culture [[id number]] Collected: 05/27/19 2343    Specimen: Other from Wound Updated: 06/02/19 0822    Narrative:      ORDER#: [id number]                                     ORDERED BY: Kalman Jewels  SOURCE: Wound abdominal wound                        COLLECTED:  05/27/19 23:43  ANTIBIOTICS AT COLL.:                                RECEIVED :  05/28/19 07:56  Culture, Anaerobic Bacteria                FINAL       06/02/19 08:22  06/02/19   No anaerobic growth      Anaerobic culture [[id number]] Collected: 05/27/19 2343    Specimen: Other from Drainage Updated: 06/02/19 9432    Narrative:      blue swab  ORDER#: [id number]                                    ORDERED BY: Kalman Jewels  SOURCE: Drainage peritoneal fluid                    COLLECTED:  05/27/19 23:43  ANTIBIOTICS AT COLL.:                                RECEIVED :  05/28/19 07:56  ORDER ENTRY COMMENTS:  blue swab  Culture, Anaerobic Bacteria                FINAL       06/02/19 08:22  06/02/19   No anaerobic growth      Blood Culture Aerobic/Anaerobic #1 [[id number]] Collected: 05/27/19 0507    Specimen: Arm from Blood, Venipuncture Updated: 06/01/19 1221    Narrative:      ORDER#: [id number]                                    ORDERED BY: CHUNG, SORA  SOURCE: Blood, Venipuncture arm                      COLLECTED:  05/27/19 05:07  ANTIBIOTICS AT COLL.:  RECEIVED :  05/27/19 09:50  Culture Blood Aerobic and Anaerobic        FINAL       06/01/19 12:21  06/01/19   No growth after 5 days of incubation.      Blood Culture Aerobic/Anaerobic #2 [[id number]] Collected: 05/27/19 0507    Specimen: Arm from Blood, Venipuncture Updated: 06/01/19 1221    Narrative:      ORDER#: [id number]                                    ORDERED BY: CHUNG, SORA  SOURCE: Blood, Venipuncture arm                      COLLECTED:  05/27/19 05:07  ANTIBIOTICS AT COLL.:                                RECEIVED :  05/27/19 09:50  Culture Blood Aerobic and Anaerobic        FINAL       06/01/19 12:21  06/01/19   No growth after 5 days of incubation.      COVID-19 (SARS-COV-2) [facility]) [[id number]]  Collected: 05/27/19 0624    Specimen: Nasopharyngeal Swab from Nasopharynx Updated: 05/27/19 0656     Purpose of COVID testing Screening     SARS-CoV-2 Specimen Source Nasopharyngeal     SARS CoV 2 Overall Result Negative     Comment: Test performed using the Abbott ID NOW EUA assay.  Please see Fact Sheets for patients and providers located at:  http://olson-hall.info/  This test is for the qualitative detection of SARS-CoV-2  (COVID19) nucleic acid. Viral nucleic acids may persist in vivo,  independent of viability. Detection of viral nucleic acid does  not imply the presence of infectious virus, or that virus  nucleic acid is the cause of clinical symptoms. Negative  results should be treated as presumptive and, if inconsistent  with clinical signs and symptoms or necessary for patient  management, should be tested with an alternative molecular  assay. Negative results do not preclude SARS-CoV-2 infection  and should not be used as the sole basis for patient  management decisions. Invalid results may be due to inhibiting  substances in the specimen and recollection should occur.         Narrative:      o Collect and clearly label specimen type:  o Upper respiratory specimen: One Nasopharyngeal Dry Swab NO  Transport Media.  o Hand deliver to laboratory ASAP  Indication for testing->Extended care facility admission to  semi private room    Culture + Gram Lenise Arena, Body Fluid [[id number]] Collected: 05/27/19 0534    Specimen: Body Fluid from Paracentesis Fluid Updated: 06/01/19 1601    Narrative:      [id number] called Micro Results of Gram Stain. Results read back by: [id number], by  [id number] on 05/27/2019 at 14:31  < 68ml  ORDER#: [id number]                                    ORDERED BY: CHUNG, SORA  SOURCE: Paracentesis Fluid peritoneal dialysis cathetCOLLECTED:  05/27/19 05:34  ANTIBIOTICS AT COLL.:  RECEIVED :  05/27/19 10:09  ORDER ENTRY COMMENTS:  < 63ml   [id number] called Micro Results of Gram Stain. Results read back by: [id number], by [id number] on 05/27/2019 at 14:31  Stain, Gram                                FINAL       05/27/19 14:31  05/27/19   No Squamous epithelial cells seen             No WBCs seen             Rare Gram positive cocci             Stain performed on Cytospin (concentrated) specimen  Culture and Gram Stain, Aerobic, Body FluidFINAL       06/01/19 16:01  05/30/19   The volume of sample received with this order is less than             the recommended volume. Submission of low volumes may             adversely affect recovery of pathogens and culture results             may be compromised.  06/01/19   ** No growth             ** Note:             ** Gram positive cocci seen in Gram Stain did not grow under             ** aerobic and anaerobic culture conditions, possible nonviable.      Fungus culture [[id number]] Collected: 05/27/19 2343    Specimen: Other from Wound Updated: 06/04/19 1529    Narrative:      ORDER#: [id number]                                    ORDERED BY: Kalman Jewels  SOURCE: Wound abdominal wound                        COLLECTED:  05/27/19 23:43  ANTIBIOTICS AT COLL.:                                RECEIVED :  05/28/19 07:56  Stain, Fungal                              FINAL       05/29/19 12:20  05/29/19   No Fungal or Yeast Elements Seen  Culture Fungus                             PRELIM      06/04/19 15:29  06/04/19   No growth after 1 week/s of incubation.      Fungus culture [[id number]] Collected: 05/27/19 0259    Specimen: Other from Drainage Updated: 06/04/19 1529    Narrative:      blue swab  ORDER#: [id number]                                    ORDERED BY:  SANDERS, ENOCH  SOURCE: Drainage peritoneal fluid                    COLLECTED:  05/27/19 02:59  ANTIBIOTICS AT COLL.:                                RECEIVED :  05/28/19 07:56  ORDER ENTRY COMMENTS:  blue swab  Stain, Fungal                              FINAL        05/29/19 12:20  05/29/19   No Fungal or Yeast Elements Seen  Culture Fungus                             PRELIM      06/04/19 15:29  06/04/19   No growth after 1 week/s of incubation.      Wound culture & gram stain [[id number]] Collected: 05/27/19 2343    Specimen: Wound Updated: 05/30/19 0829    Narrative:      blue swab  ORDER#: [id number]                                    ORDERED BY: Kalman Jewels  SOURCE: Wound peritoneal fluid                       COLLECTED:  05/27/19 23:43  ANTIBIOTICS AT COLL.:                                RECEIVED :  05/28/19 07:56  ORDER ENTRY COMMENTS:  blue swab  Stain, Gram                                FINAL       05/28/19 13:06  05/28/19   Few WBCs             Rare Squamous epithelial cells             No organisms seen  Culture and Gram Stain, Aerobic, Wound     FINAL       05/30/19 08:29   +  05/29/19   Light growth of Pseudomonas aeruginosa               Refer to susceptibilities on culture #[id number]        Wound culture & gram stain [[id number]] Collected: 05/27/19 0221    Specimen: Wound Updated: 05/30/19 0828    Narrative:      ORDER#: [id number]                                    ORDERED BY: Kalman Jewels  SOURCE: Wound abdominal wound                        COLLECTED:  05/27/19 02:21  ANTIBIOTICS AT COLL.:  RECEIVED :  05/28/19 07:56  Stain, Gram                                FINAL       05/28/19 13:03  05/28/19   Moderate WBCs             Few Squamous epithelial cells             No organisms seen  Culture and Gram Stain, Aerobic, Wound     FINAL       05/30/19 08:28   +  05/30/19   Light growth of Pseudomonas aeruginosa      _____________________________________________________________________________                                  P.aeruginosa    ANTIBIOTICS                     MIC  INTRP      _____________________________________________________________________________  Amikacin                        <=8    S         Aztreonam                        8     S        Cefepime                         2     S        Ceftazidime                     <=2    S        Ciprofloxacin                 <=0.25   S        Gentamicin                      <=2    S        Levofloxacin                   <=0.5   S        Meropenem                      <=0.5   S        Piperacillin/Tazobactam         4/4    S        Tobramycin                      <=2    S        _____________________________________________________________________________            S=SUSCEPTIBLE     I=INTERMEDIATE     R=RESISTANT                            N/S=NON-SUSCEPTIBLE  _____________________________________________________________________________                  Surgery note 06/05/2019  Hospital Day: 7  ASSESSMENT:   9 Days Post-Op  S/PProcedure(s):  REMOVAL, PERITONEAL DIALYSIS CATHETER  EXPLORATORY LAPAROTOMY    PLAN:   Monitor wound erythema  Cont abx per ID  Staples out [id number]    SUBJECTIVE:   The patient is doing well.  Abdominal pain is  decreased.  Current dietary status:  Glucerna Supplement Quantity: A. One; Flavor: Strawberry; Frequency: Daily with lunch  Diet consistent carbohydrate and renal Protein restriction: 80 GM Protein and tolerating well.  Flatus: yes. BM:  yes.  Additional complaints: none    MD note 06/04/2019  ASSESSMENT/PLAN     [name] is a 64 y.o. female admitted with Generalized abdominal pain    Interval Summary:         Active Hospital Problems    Diagnosis    Bowel incontinence    Thrombocytopenia    Hypotension    Type 2 diabetes mellitus, with long-term current use of insulin    GERD (gastroesophageal reflux disease)    Generalized abdominal pain    ESRD (end stage renal disease) on dialysis    Peritonitis    Peritoneal dialysis catheter dysfunction, initial encounter       Patient Active Hospital Problem List:   Generalized abdominal pain (05/27/2019)   Peritoneal dialysis catheter dysfunction, initial encounter  (05/12/2019)   Peritonitis (05/24/2019)    Assessment: PD catheter removed by general surgery with exploratory laparotomy on 05/28/2019. Pleural fluid growing Pseudomonas and E. Coli.  ID has been following.    Plan: Appreciate ID inputs  Switched from Zosyn to oral Cipro prior to discharge to skilled nursing facility   Oral vancomycin 125 mg every 6 hours has been continued on account of loose stools  incentive spirometry use encouraged   Per ID p.o. Cipro and p.o. vancomycin to continue to 5/19.  Stopped IV morphine  Additional pain regimen as below.     Bowel incontinence   Assessment-unclear if this is a complication of prior back injury; however this problem is chronic and has been going on for the past year  Plan-minimize risk for bedsores with frequent turning and quick response to any accidents     ESRD (end stage renal disease) on dialysis (05/24/2019)    Assessment: Patient was on PD prior to admission; she has been switched to hemodialysis for now; outpatient HD has been arranged for    Plan: Appreciate nephrology inputs    Outpatient dialysis to continue on a Tuesday Thursday Saturday schedule     Thrombocytopenia (05/31/2019)    Assessment: Could be due to the acute infection as above; platelet counts are stable    Plan: Okay to continue heparin for DVT prophylaxis  Monitor daily CBC     Hypotension (05/31/2019)    Assessment: Likely due to the infection above    Plan: Antibiotics as above     Type 2 diabetes mellitus, with long-term current use of insulin (05/31/2019)    Assessment: Chronic and unknown control    Plan: Continue with insulin as above     GERD (gastroesophageal reflux disease) (05/31/2019)    Assessment: Chronic and unknown control    Plan: c/w home dose of protonix   started maalox       Analgesia: continue Tylenol 1 g every 6 hours; reduced dose of gabapentin to 300 mg every night; continue oxycodone 5 to 10 mg every 6 hours as needed; stopped IV morphine    Nutrition: Consistent carb plus  renal    GI Prophylaxis: None    DVT Prophylaxis: Heparin  Code Status: DNR/allow natural death    DISPO: discharge to skilled nursing facility on 5/12.  Awaiting bed, likely on 5/13 after hemodialysis       [place] states that abdominal pain is better today.  She reports that she was able to sit up on the chair all day and also able to walk to the bathroom unassisted.        Medications: Scheduled Meds:  Current Facility-Administered Medications   Medication Dose Route Frequency    acetaminophen  1,000 mg Oral 4 times per day    amLODIPine  5 mg Oral Q12H    atorvastatin  40 mg Oral Daily    carvedilol  25 mg Oral Q12H SCH    darbepoetin alfa  60 mcg Subcutaneous Weekly    docusate sodium  100 mg Oral Daily    gabapentin  300 mg Oral Daily    heparin (porcine)  5,000 Units Subcutaneous Q12H [place]    insulin glargine  5 Units Subcutaneous Q12H    insulin lispro  1-8 Units Subcutaneous TID AC    lactobacillus/streptococcus  1 capsule Oral Daily    levoFLOXacin  250 mg Oral Q48H    pantoprazole  40 mg Oral QAM AC    sevelamer  800 mg Oral TID MEALS    vancomycin  125 mg Oral Q6H    zinc Oxide   Topical Q6H     Continuous Infusions:   sodium chloride 20 mL/hr at 05/27/19 1100    sodium chloride 15 mL/hr at 06/02/19 2057    lactated ringers       PRN Meds:.sodium chloride, bisacodyl, Nursing communication: Adult Hypoglycemia Treatment Algorithm **AND** dextrose **AND** dextrose **AND** glucagon (rDNA), Nursing communication: Adult Hypoglycemia Treatment Algorithm **AND** dextrose **AND** dextrose **AND** glucagon (rDNA), diphenhydrAMINE, guaiFENesin-dextromethorphan, naloxone, ondansetron, ondansetron **OR** ondansetron, oxyCODONE **OR** oxyCODONE, petrolatum

## 2019-06-05 NOTE — Plan of Care (Signed)
Problem: Renal Instability  Goal: Fluid and electrolyte balance are achieved/maintained  Flowsheets (Taken 06/05/2019 0945)  Fluid and electrolyte balance are achieved/maintained:   Monitor/assess lab values and report abnormal values   Monitor daily weight   Assess and reassess fluid and electrolyte status   Follow fluid restrictions/IV/PO parameters  Goal: Free from infection  Flowsheets (Taken 06/05/2019 1040)  Free from infection: Monitor/assess for signs and symptoms of infection     Problem: Renal Instability  Goal: Free from infection  Flowsheets (Taken 06/05/2019 1040)  Free from infection: Monitor/assess for signs and symptoms of infection     Problem: Patient Receiving Advanced Renal Therapies  Goal: Therapy access site remains intact  Flowsheets (Taken 06/05/2019 0945)  Therapy access site remains intact:   Assess therapy access site   Change therapy access site dressing as needed

## 2019-06-05 NOTE — Progress Notes (Signed)
Nutritional Support Services  Nutrition Note    [name] 64 y.o. female   MRN: [id number]      Referral Source:  Census Review   Reason for Referral: LOS    Diet: Consistent CHO, Renal     Education Provided: Pt off unit, unable to provide education at this time. Education provided at recent admission    Clinical Nutrition Summary: Pt off unit at time of assessment. Spoke with RN who reports that pt has been eating well during admission. Notes that pt consumed 100% of breakfast this morning. Per review of chart, pt with intake consistently 100% during admission. Pt known from recent previous admission At that time pt reported generally good intake, but decreased a 3 days pta due to abdominal pain. Appears to have resolved currently. No reports N/V/D/C. Pt with overall no overt signs of malnutrition at previous admission, and weights stable since, so expect same. No issues reported at this time. D/c planning- awaiting placement      No nutrition diagnosis at this time.      Patient is currently at low nutritional risk; will be available within 10 days and PRN.      Edsel Petrin MS, RDN, [place]  Clinical Dietitian  [phone]

## 2019-06-05 NOTE — Progress Notes (Signed)
GENERAL SURGERY PROGRESS NOTE  VSA [phone]    Date Time: 06/05/19 5:31 AM  Patient Name: [facility] Day: 7    ASSESSMENT:   9 Days Post-Op S/P Procedure(s):  REMOVAL, PERITONEAL DIALYSIS CATHETER  EXPLORATORY LAPAROTOMY      PLAN:   Monitor wound erythema  Cont abx per ID  Staples out POD14    SUBJECTIVE:   The patient is doing well.  Abdominal pain is  decreased.  Current dietary status:  Glucerna Supplement Quantity: A. One; Flavor: Strawberry; Frequency: Daily with lunch  Diet consistent carbohydrate and renal Protein restriction: 80 GM Protein and tolerating well.  Flatus: yes. BM:  yes.  Additional complaints: none     Objective:   Current Vitals:   Vitals:    06/05/19 0418   BP: 135/85   Pulse: 71   Resp: 16   Temp: 97.9 F (36.6 C)   SpO2: 99%       Intake and Output Summary (Last 24 hours):  I/O last 3 completed shifts:  In: 2683 [P.O.:1477; I.V.:20]  Out: 2500 [Other:2500]    Labs:     Results       Procedure Component Value Units Date/Time    Glucose Whole Blood - POCT [[id number]]  (Abnormal) Collected: 06/04/19 2115     Updated: 06/04/19 2122     Whole Blood Glucose POCT 263 mg/dL     Glucose Whole Blood - POCT [[id number]]  (Abnormal) Collected: 06/04/19 1709     Updated: 06/04/19 1711     Whole Blood Glucose POCT 177 mg/dL     Fungus culture [[id number]] Collected: 05/27/19 2343    Specimen: Other from Wound Updated: 06/04/19 1529    Narrative:      ORDER#: [id number]                                    ORDERED BY: Kalman Jewels  SOURCE: Wound abdominal wound                        COLLECTED:  05/27/19 23:43  ANTIBIOTICS AT COLL.:                                RECEIVED :  05/28/19 07:56  Stain, Fungal                              FINAL       05/29/19 12:20  05/29/19   No Fungal or Yeast Elements Seen  Culture Fungus                             PRELIM      06/04/19 15:29  06/04/19   No growth after 1 week/s of incubation.      Fungus culture [[id number]] Collected: 05/27/19 0259     Specimen: Other from Drainage Updated: 06/04/19 1529    Narrative:      blue swab  ORDER#: [id number]                                    ORDERED BY: Kalman Jewels  SOURCE: Drainage peritoneal fluid  COLLECTED:  05/27/19 02:59  ANTIBIOTICS AT COLL.:                                RECEIVED :  05/28/19 07:56  ORDER ENTRY COMMENTS:  blue swab  Stain, Fungal                              FINAL       05/29/19 12:20  05/29/19   No Fungal or Yeast Elements Seen  Culture Fungus                             PRELIM      06/04/19 15:29  06/04/19   No growth after 1 week/s of incubation.      Glucose Whole Blood - POCT [[id number]]  (Abnormal) Collected: 06/04/19 1135     Updated: 06/04/19 1138     Whole Blood Glucose POCT 236 mg/dL             Rads:     Radiology Results (24 Hour)       ** No results found for the last 24 hours. **              Medications:     Current Facility-Administered Medications   Medication Dose Route Frequency    acetaminophen  1,000 mg Oral 4 times per day    amLODIPine  5 mg Oral Q12H    atorvastatin  40 mg Oral Daily    carvedilol  25 mg Oral Q12H SCH    darbepoetin alfa  60 mcg Subcutaneous Weekly    docusate sodium  100 mg Oral Daily    gabapentin  300 mg Oral Daily    heparin (porcine)  5,000 Units Subcutaneous Q12H Brandon    insulin lispro  1-8 Units Subcutaneous TID AC    lactobacillus/streptococcus  1 capsule Oral Daily    levoFLOXacin  250 mg Oral Q48H    pantoprazole  40 mg Oral QAM AC    sevelamer  800 mg Oral TID MEALS    vancomycin  125 mg Oral Q6H    zinc Oxide   Topical Q6H      sodium chloride 20 mL/hr at 05/27/19 1100    sodium chloride 15 mL/hr at 06/02/19 2057    lactated ringers       sodium chloride, bisacodyl, Nursing communication: Adult Hypoglycemia Treatment Algorithm **AND** dextrose **AND** dextrose **AND** glucagon (rDNA), diphenhydrAMINE, guaiFENesin-dextromethorphan, naloxone, ondansetron, ondansetron **OR** ondansetron, oxyCODONE **OR** oxyCODONE,  petrolatum     Physical Exam:     General appearance - alert, well appearing, and in no distress  Mental status - normal mood, behavior, speech, dress, motor activity, and thought processes  Chest - non-labored  Heart - normal rate and regular rhythm  Abdomen - soft, obese, mild peri-incisional tenderness with some erythema around the wound. No rebound or guarding. No fluctuance or swelling.  Wound - clean, dry, no drainage, erythema  Extremities - intact peripheral pulses    Signed by: Sharon Seller      This patient was personally seen and examined by me and I agree with the assessment and plan above.  All notes, labs, and xrays were reviewed.  The patient was seen on the date the note was written.    Will check incision tomorrow.  Iran Sizer , MD [facility]  [facility]  [address] [place], [place] [address]  [phone]

## 2019-06-05 NOTE — Progress Notes (Signed)
Hands off report received from April F. Hernandez RN;  Pt. Arrived to unit a/ox3; w/ no discomfort as verbalized.  Surgical site  (post r/o PD cath) reddened area increased  From the other day. Rt. CVC patent. VSS; Will try to take off 3L today per order as tolerated. Will monitor closely while in HD.   06/05/19 0900   Bedside Nurse Communication   Name of bedside RN - pre dialysis [name]   Treatment Initiation- With Dialysis Precautions   Time Out/Safety Check Completed Yes   Consent for HD signed for this hospitalization Y   Blood Consent Verified N/A   Dialysis Precautions All Connections Secured;Saline Line Double Clamped   Dialysis Treatment Type Routine   Is patient diabetic? Yes   RO/Hemodialysis Architectural technologist   Is Total Chlorine less than 0.1 ppm? Yes   Orignial Total Chlorine Testing Time 0900   At 4 Hour Total Chlorine Testing Time 1300   RO/Hemodialysis Facilities manager Number [id number]   RO # [id number]   Water Hardness 0   pH 7.2   Pressure Test Verified Yes   Alarms Verified Passed   Machine Temperature 96.8 F (36 C)   Alarms Verified Yes   Na+ mEq (Machine) 138 mEq   Bicarb mEq (Machine) 35 mEq   Hemodialysis Conductivity (Machine) 13.5   Hemodialysis Conductivity (Meter) 14   Dialyzer Lot Number [id number]   Tubing Lot Number [id number]   RO Machine Log Completed Yes   Hepatitis Status   HBsAg (Antigen) Result Negative   HBsAg Date Drawn 05/13/19   Dialysis Weight   Pre-Treatment Weight (Kg) 110.2   Scale Type ICU Bed Scale   Vitals   Temp 98.1 F (36.7 C)   Heart Rate 66   Resp Rate 18   BP 129/62   SpO2 98 %   O2 Device None (Room air)   Assessment   Mental Status Alert;Oriented;Cooperative   Cardiac (WDL) WDL   Cardiac Regularity Regular   Cardiac Symptoms None   Cardiac Rhythm Normal Sinus Rhythm   Respiratory  WDL   Respiratory Pattern Regular   Bilateral Breath Sounds Clear   R Breath Sounds Clear   Edema  WDL   Generalized Edema None   Facial None   Sacral UTA   RLE  Edema +1   LLE Edema +1   General Skin Color Appropriate for ethnicity   Skin Condition/Temp Warm;Dry   Gastrointestinal (WDL) WDL   Abdomen Inspection Nondistended   GI Symptoms None   Mobility Ambulatory with Assistance   Pain Assessment   Charting Type Assessment   Pain Scale Used Numeric Scale (0-10)   Numeric Pain Scale   Pain Score 0   POSS Score 1   Pain Location Abdomen   Hemodialysis Comments   Pre-Hemodialysis Comments Timeout safety check- done   Hemodialysis History Information   Outpatient Nephrologist Dr. Vevelyn Francois

## 2019-06-05 NOTE — Discharge Summary (Signed)
[facility]      Patient: [name]  Admission Date: 05/27/2019   DOB: 1955/05/26  Discharge Date: 06/04/2019   MRN: [id number]  Discharge Attending:Leone Mobley E Lovena Le     Referring Physician: Pcp, None, MD  PCP: Pcp, None, MD       DISCHARGE SUMMARY     Discharge Information   Admission Diagnosis:   Generalized abdominal pain    Discharge Diagnosis:   Active Hospital Problems    Diagnosis    Bowel incontinence    Thrombocytopenia    Hypotension    Type 2 diabetes mellitus, with long-term current use of insulin    GERD (gastroesophageal reflux disease)    Generalized abdominal pain    ESRD (end stage renal disease) on dialysis    Peritonitis    Peritoneal dialysis catheter dysfunction, initial encounter        Admission Condition: Guarded  Discharge Condition: Stable  Consultants: Infectious disease, surgery, nephrology  Functional Status: Ambulating  Discharged to: SNF  Procedures: PD catheter removal  Surgeries: PD catheter removal    Imaging:     CT Abd/Pelvis without Contrast    Result Date: 05/12/2019   INTRAPERITONEAL FLUID/ASCITES (? RELATED TO THE PERITONEAL DIALYSIS) Leonard Downing, MD  05/12/2019 9:42 PM    CT Angiogram Chest    Result Date: 05/18/2019     No evidence of pulmonary embolism. Small bilateral pleural effusions. Large hiatal hernia. Trace perihepatic ascites. Other nonurgent findings as above. Lonia Skinner, MD  05/18/2019 9:58 AM    CT Abd/Pelvis with IV Contrast only    Result Date: 05/27/2019  1. Fluid-filled mildly distended loops of small bowel with distal decompressed small bowel loops. Findings may represent early or partial small bowel obstruction. Enteritis is felt to be less likely. 2. Trace bilateral pleural effusions. 3. Additional chronic findings as above. Delaney Meigs, MD  05/27/2019 6:07 AM    Tunneled Cath Placement (Permcath)    Result Date: 05/23/2019   Technically successful placement of an  23cm Equistream catheter via the right internal jugular vein as described above.  Phlebitic changes in the right internal jugular vein from prior catheter placement. Dimitrios Papadouris, MD  05/23/2019 3:51 PM     Echo Results     None        Discharge Medications:     Medication List      START taking these medications    lactobacillus/streptococcus Caps  Take 1 capsule by mouth daily     levoFLOXacin 250 MG tablet  Commonly known as: LEVAQUIN  Take 1 tablet (250 mg total) by mouth every other day for 6 days     oxyCODONE 5 MG immediate release tablet  Commonly known as: ROXICODONE  Take 1 tablet (5 mg total) by mouth every 6 (six) hours as needed for Pain     vancomycin HCl 250 MG/5ML oral solution  Commonly known as: FIRVANQ  Take 2.5 mLs (125 mg total) by mouth every 6 (six) hours for 7 days     zinc Oxide 40 % Pste paste  Commonly known as: DESITIN  Apply topically every 6 (six) hours        CHANGE how you take these medications    carvedilol 25 MG tablet  Commonly known as: COREG  Take 1 tablet (25 mg total) by mouth every 12 (twelve) hours  What changed: when to take this     * insulin glargine 100 UNIT/ML injection  Commonly known as: LANTUS  Inject  10 Units into the skin every morning  What changed: Another medication with the same name was added. Make sure you understand how and when to take each.     * insulin glargine 100 UNIT/ML injection  Commonly known as: LANTUS  Inject 5 Units into the skin every 12 (twelve) hours  What changed: You were already taking a medication with the same name, and this prescription was added. Make sure you understand how and when to take each.     Tresiba 100 UNIT/ML Soln  Generic drug: Insulin Degludec  What changed: Another medication with the same name was removed. Continue taking this medication, and follow the directions you see here.         * This list has 2 medication(s) that are the same as other medications prescribed for you. Read the directions carefully, and ask your doctor or other care provider to review them with you.            CONTINUE  taking these medications    amLODIPine 5 MG tablet  Commonly known as: NORVASC  Take 1 tablet (5 mg total) by mouth every 12 (twelve) hours     atorvastatin 40 MG tablet  Commonly known as: LIPITOR     famotidine 40 MG tablet  Commonly known as: PEPCID     gabapentin 600 MG tablet  Commonly known as: NEURONTIN     insulin aspart 100 UNIT/ML injection  Commonly known as: NovoLOG     pantoprazole 40 MG tablet  Commonly known as: PROTONIX  Take 1 tablet (40 mg total) by mouth 2 (two) times daily     sevelamer 800 MG tablet  Commonly known as: RENVELA     Trulicity 5.05 WP/7.9YI Sopn  Generic drug: Dulaglutide  Notes to patient: Resume as outpatient schedule        STOP taking these medications    furosemide 40 MG tablet  Commonly known as: LASIX     oxyCODONE-acetaminophen 5-325 MG per tablet  Commonly known as: PERCOCET           Where to Get Your Medications      Information about where to get these medications is not yet available    Ask your nurse or doctor about these medications   carvedilol 25 MG tablet   insulin glargine 100 UNIT/ML injection   lactobacillus/streptococcus Caps   levoFLOXacin 250 MG tablet   oxyCODONE 5 MG immediate release tablet   vancomycin HCl 250 MG/5ML oral solution   zinc Oxide 40 % Pste paste             Hospital Course   Presentation History   Per. NP Darral Dash "[name] is a 64 y.o. female with a PMHx of  hypertension, hyperlipidemia, insulin-dependent diabetes mellitus, COPD, end-stage renal disease on peritoneal dialysis   Recently switched to hemodialysis and discharged yesterday return to the ED with recurrent abdominal pain associated with Non bloody diarrhea, nausea vomiting of nonbilious and nonbloody vomitus.  Patient states that she continued to have loose watery stools on arriving at home yesterday followed by nausea vomiting 2-3 times, described abdominal pain as left-sided pain intensity greater than 10, pain is worse with palpation.  She denies fever/chills,  dysuria, chest pain, shortness of breath, blood in stool or vomitus.  She had dialysis yesterday prior to discharge and have not arranged transportation for outpatient dialysis yet.    In the emergency room, CT abdomen pelvis showed partial small bowel obstruction, trace  bilateral pleural effusions, revealed WBC of 15.22, H&H of 10.4/34.1, stool studies pending, infectious disease Dr. Lahoma Crocker and Surgery Dr. Signa Kell consulted".    Hospital Course (7 Days):      Patient was admitted for PD peritonitis.  Peritoneal fluid grew Pseudomonas.  ID saw patient and recommended IV Zosyn which was deescalated to Cipro based on susceptibility.  ID recommends continuing antibiotics till 5/19.  Patient was also treated prophylactically for C. difficile.  P.o. vancomycin was used.  ID recommend continue p.o. vancomycin till 5/19.  Patient's PD catheter was removed and she was started on hemodialysis.  Outpatient hemodialysis was set up prior to discharge.  PT assessed patient and recommended SNF.  Case manager working on SNF placement.      Patient Active Hospital Problem List:   Generalized abdominal pain (05/27/2019)   Peritoneal dialysis catheter dysfunction, initial encounter (05/12/2019)   Peritonitis (05/24/2019)    Assessment: PD catheter removed by general surgery with exploratory laparotomy on 05/28/2019. Pleural fluid growing Pseudomonas and E. Coli.  ID has been following.    Plan: Appreciate ID inputs  Switched from Zosyn to oral Cipro prior to discharge to skilled nursing facility   Oral vancomycin 125 mg every 6 hours has been continued on account of loose stools  incentive spirometry use encouraged   Per ID p.o. Cipro and p.o. vancomycin to continue to 5/19.  Stopped IV morphine  Additional pain regimen as below.     Bowel incontinence   Assessment-unclear if this is a complication of prior back injury; however this problem is chronic and has been going on for the past year  Plan-minimize risk for bedsores with frequent  turning and quick response to any accidents     ESRD (end stage renal disease) on dialysis (05/24/2019)    Assessment: Patient was on PD prior to admission; she has been switched to hemodialysis for now; outpatient HD has been arranged for    Plan: Appreciate nephrology inputs    Outpatient dialysis to continue on a Tuesday Thursday Saturday schedule     Thrombocytopenia (05/31/2019)    Assessment: Could be due to the acute infection as above; platelet counts are stable    Plan: Okay to continue heparin for DVT prophylaxis  Monitor daily CBC     Hypotension (05/31/2019)    Assessment: Likely due to the infection above    Plan: Antibiotics as above     Type 2 diabetes mellitus, with long-term current use of insulin (05/31/2019)    Assessment: Chronic and unknown control    Plan: Continue with insulin as above     GERD (gastroesophageal reflux disease) (05/31/2019)    Assessment: Chronic and unknown control    Plan: c/w home dose of protonix   started maalox     RECOMMENDATIONS:    - Patient was informed of abnormal and incidental imaging findings during hospitalization, and advised to review this information with their  medical provider.             Best Practices   Was the patient admitted with either a CHF Exacerbation or Pneumonia? NO     Progress Note/Physical Exam at Discharge     Subjective: Patient reported feeling well, and is ready for discharge.    Vitals:    06/05/19 1300 06/05/19 1314 06/05/19 1345 06/05/19 1352   BP: 137/65 153/66 129/75    Pulse: 61 62  63   Resp: 16 18  18    Temp:  97.8 F (36.6 C)  97.9 F (36.6 C)   TempSrc:    Oral   SpO2: 97% 98%  98%   Weight:       Height:           General: NAD  HEENT: sclera anicteric, OP: Clear, MMM  Cardiovascular: RRR, no m/r/g  Lungs: CTAB, no w/r/r  Abdomen: soft, +BS, mild tenderness around incision site, no masses, no g/r  Extremities: Warm and well perfused  Skin: no rashes or lesions noted on exposed surfaces  Neuro: Answers questions appropriately,  responds to commands       Diagnostics     Labs/Studies Pending at Discharge:    Last Labs   Recent Labs   Lab 06/04/19  0536 06/03/19  0954 06/02/19  0345   WBC 9.82* 8.18 9.29   RBC 2.67* 2.90* 2.53*   Hgb 7.6* 8.3* 7.2*   Hematocrit 26.5* 28.6* 25.3*   MCV 99.3* 98.6* 100.0*   Platelets 170 204 201       Recent Labs   Lab 06/04/19  0536 06/02/19  0345 05/30/19  0311   Sodium 137 137 139   Potassium 4.2 4.8 3.7   Chloride 102 103 101   CO2 24 23 26    BUN 23.0* 28.0* 20.0*   Creatinine 5.1* 5.8* 4.3*   Glucose 263* 174* 112*   Calcium 7.3* 7.0* 7.0*       Microbiology Results     Procedure Component Value Units Date/Time    Anaerobic culture [[id number]] Collected: 05/27/19 2343    Specimen: Other from Wound Updated: 06/02/19 0822    Narrative:      ORDER#: [id number]                                    ORDERED BY: Kalman Jewels  SOURCE: Wound abdominal wound                        COLLECTED:  05/27/19 23:43  ANTIBIOTICS AT COLL.:                                RECEIVED :  05/28/19 07:56  Culture, Anaerobic Bacteria                FINAL       06/02/19 08:22  06/02/19   No anaerobic growth      Anaerobic culture [[id number]] Collected: 05/27/19 2343    Specimen: Other from Drainage Updated: 06/02/19 5732    Narrative:      blue swab  ORDER#: [id number]                                    ORDERED BY: Kalman Jewels  SOURCE: Drainage peritoneal fluid                    COLLECTED:  05/27/19 23:43  ANTIBIOTICS AT COLL.:                                RECEIVED :  05/28/19 07:56  ORDER ENTRY COMMENTS:  blue swab  Culture, Anaerobic Bacteria                FINAL  06/02/19 08:22  06/02/19   No anaerobic growth      Blood Culture Aerobic/Anaerobic #1 [[id number]] Collected: 05/27/19 0507    Specimen: Arm from Blood, Venipuncture Updated: 06/01/19 1221    Narrative:      ORDER#: [id number]                                    ORDERED BY: CHUNG, SORA  SOURCE: Blood, Venipuncture arm                      COLLECTED:  05/27/19 05:07   ANTIBIOTICS AT COLL.:                                RECEIVED :  05/27/19 09:50  Culture Blood Aerobic and Anaerobic        FINAL       06/01/19 12:21  06/01/19   No growth after 5 days of incubation.      Blood Culture Aerobic/Anaerobic #2 [[id number]] Collected: 05/27/19 0507    Specimen: Arm from Blood, Venipuncture Updated: 06/01/19 1221    Narrative:      ORDER#: [id number]                                    ORDERED BY: CHUNG, SORA  SOURCE: Blood, Venipuncture arm                      COLLECTED:  05/27/19 05:07  ANTIBIOTICS AT COLL.:                                RECEIVED :  05/27/19 09:50  Culture Blood Aerobic and Anaerobic        FINAL       06/01/19 12:21  06/01/19   No growth after 5 days of incubation.      COVID-19 (SARS-COV-2) [employer] [facility]) [[id number]] Collected: 05/27/19 0624    Specimen: Nasopharyngeal Swab from Nasopharynx Updated: 05/27/19 0656     Purpose of COVID testing Screening     SARS-CoV-2 Specimen Source Nasopharyngeal     SARS CoV 2 Overall Result Negative     Comment: Test performed using the Abbott ID NOW EUA assay.  Please see Fact Sheets for patients and providers located at:  http://olson-hall.info/  This test is for the qualitative detection of SARS-CoV-2  (COVID19) nucleic acid. Viral nucleic acids may persist in vivo,  independent of viability. Detection of viral nucleic acid does  not imply the presence of infectious virus, or that virus  nucleic acid is the cause of clinical symptoms. Negative  results should be treated as presumptive and, if inconsistent  with clinical signs and symptoms or necessary for patient  management, should be tested with an alternative molecular  assay. Negative results do not preclude SARS-CoV-2 infection  and should not be used as the sole basis for patient  management decisions. Invalid results may be due to inhibiting  substances in the specimen and recollection should occur.         Narrative:      o Collect and clearly label specimen  type:  o Upper respiratory specimen: One Nasopharyngeal Dry Swab NO  Transport Media.  o  Hand deliver to laboratory ASAP  Indication for testing->Extended care facility admission to  semi private room    Culture + Gram Lenise Arena, Body Fluid [[id number]] Collected: 05/27/19 0534    Specimen: Body Fluid from Paracentesis Fluid Updated: 06/01/19 1601    Narrative:      [id number] called Micro Results of Gram Stain. Results read back by: [id number], by  [id number] on 05/27/2019 at 14:31  < 42ml  ORDER#: [id number]                                    ORDERED BY: CHUNG, SORA  SOURCE: Paracentesis Fluid peritoneal dialysis cathetCOLLECTED:  05/27/19 05:34  ANTIBIOTICS AT COLL.:                                RECEIVED :  05/27/19 10:09  ORDER ENTRY COMMENTS:  < 35ml  [id number] called Micro Results of Gram Stain. Results read back by: [id number], by [id number] on 05/27/2019 at 14:31  Stain, Gram                                FINAL       05/27/19 14:31  05/27/19   No Squamous epithelial cells seen             No WBCs seen             Rare Gram positive cocci             Stain performed on Cytospin (concentrated) specimen  Culture and Gram Stain, Aerobic, Body FluidFINAL       06/01/19 16:01  05/30/19   The volume of sample received with this order is less than             the recommended volume. Submission of low volumes may             adversely affect recovery of pathogens and culture results             may be compromised.  06/01/19   ** No growth             ** Note:             ** Gram positive cocci seen in Gram Stain did not grow under             ** aerobic and anaerobic culture conditions, possible nonviable.      Fungus culture [[id number]] Collected: 05/27/19 2343    Specimen: Other from Wound Updated: 06/04/19 1529    Narrative:      ORDER#: [id number]                                    ORDERED BY: Kalman Jewels  SOURCE: Wound abdominal wound                        COLLECTED:  05/27/19 23:43  ANTIBIOTICS AT COLL.:                                 RECEIVED :  05/28/19 07:56  Stain, Fungal  FINAL       05/29/19 12:20  05/29/19   No Fungal or Yeast Elements Seen  Culture Fungus                             PRELIM      06/04/19 15:29  06/04/19   No growth after 1 week/s of incubation.      Fungus culture [[id number]] Collected: 05/27/19 0259    Specimen: Other from Drainage Updated: 06/04/19 1529    Narrative:      blue swab  ORDER#: [id number]                                    ORDERED BY: Kalman Jewels  SOURCE: Drainage peritoneal fluid                    COLLECTED:  05/27/19 02:59  ANTIBIOTICS AT COLL.:                                RECEIVED :  05/28/19 07:56  ORDER ENTRY COMMENTS:  blue swab  Stain, Fungal                              FINAL       05/29/19 12:20  05/29/19   No Fungal or Yeast Elements Seen  Culture Fungus                             PRELIM      06/04/19 15:29  06/04/19   No growth after 1 week/s of incubation.      Wound culture & gram stain [[id number]] Collected: 05/27/19 2343    Specimen: Wound Updated: 05/30/19 0829    Narrative:      blue swab  ORDER#: [id number]                                    ORDERED BY: Kalman Jewels  SOURCE: Wound peritoneal fluid                       COLLECTED:  05/27/19 23:43  ANTIBIOTICS AT COLL.:                                RECEIVED :  05/28/19 07:56  ORDER ENTRY COMMENTS:  blue swab  Stain, Gram                                FINAL       05/28/19 13:06  05/28/19   Few WBCs             Rare Squamous epithelial cells             No organisms seen  Culture and Gram Stain, Aerobic, Wound     FINAL       05/30/19 08:29   +  05/29/19   Light growth of Pseudomonas aeruginosa               Refer to  susceptibilities on culture #[id number]        Wound culture & gram stain [[id number]] Collected: 05/27/19 0221    Specimen: Wound Updated: 05/30/19 0828    Narrative:      ORDER#: [id number]                                    ORDERED BY: Kalman Jewels  SOURCE: Wound abdominal wound                         COLLECTED:  05/27/19 02:21  ANTIBIOTICS AT COLL.:                                RECEIVED :  05/28/19 07:56  Stain, Gram                                FINAL       05/28/19 13:03  05/28/19   Moderate WBCs             Few Squamous epithelial cells             No organisms seen  Culture and Gram Stain, Aerobic, Wound     FINAL       05/30/19 08:28   +  05/30/19   Light growth of Pseudomonas aeruginosa      _____________________________________________________________________________                                  P.aeruginosa    ANTIBIOTICS                     MIC  INTRP      _____________________________________________________________________________  Amikacin                        <=8    S        Aztreonam                        8     S        Cefepime                         2     S        Ceftazidime                     <=2    S        Ciprofloxacin                 <=0.25   S        Gentamicin                      <=2    S        Levofloxacin                   <=0.5   S        Meropenem                      <=0.5   S  Piperacillin/Tazobactam         4/4    S        Tobramycin                      <=2    S        _____________________________________________________________________________            S=SUSCEPTIBLE     I=INTERMEDIATE     R=RESISTANT                            N/S=NON-SUSCEPTIBLE  _____________________________________________________________________________             Patient Instructions   Discharge Diet: Heart healthy  Discharge Activity: As tolerated  LABS/TESTING recommended after discharge    Follow Up Appointment:  Follow-up Information     Herbert Moors., MD. Schedule an appointment as soon as possible for a visit in 10 day(s).    Specialty: Surgery  Why: for staple removal  Contact information:  [address]  [place] [address]  [phone]             Pcp, None, MD .           Ulyses Amor, MD. Schedule an appointment as soon as  possible for a visit in 1 week(s).    Specialties: Nephrology, Internal Medicine  Contact information:  [address]  [address] [address]  [phone]             Ernst Breach, MD. Schedule an appointment as soon as possible for a visit.    Specialties: Infectious Disease, Internal Medicine  Contact information:  [place] [address]  [phone]                    Time spent examining patient, discussing with patient/family regarding hospital course, chart review, reconciling medications and discharge planning: > 35 minutes.  This patient was examined by me on , the day of discharge.    Signed,  Delfina Redwood    3:03 PM 06/05/2019

## 2019-06-06 LAB — BASIC METABOLIC PANEL
Anion Gap: 10 (ref 5.0–15.0)
BUN: 22 mg/dL — ABNORMAL HIGH (ref 7.0–19.0)
CO2: 27 mEq/L (ref 22–29)
Calcium: 7.7 mg/dL — ABNORMAL LOW (ref 8.5–10.5)
Chloride: 100 mEq/L (ref 100–111)
Creatinine: 4.4 mg/dL — ABNORMAL HIGH (ref 0.6–1.0)
Glucose: 255 mg/dL — ABNORMAL HIGH (ref 70–100)
Potassium: 4.2 mEq/L (ref 3.5–5.1)
Sodium: 137 mEq/L (ref 136–145)

## 2019-06-06 LAB — CBC
Absolute NRBC: 0.02 10*3/uL — ABNORMAL HIGH (ref 0.00–0.00)
Hematocrit: 28.3 % — ABNORMAL LOW (ref 34.7–43.7)
Hgb: 8.1 g/dL — ABNORMAL LOW (ref 11.4–14.8)
MCH: 28.3 pg (ref 25.1–33.5)
MCHC: 28.6 g/dL — ABNORMAL LOW (ref 31.5–35.8)
MCV: 99 fL — ABNORMAL HIGH (ref 78.0–96.0)
MPV: 11.8 fL (ref 8.9–12.5)
Nucleated RBC: 0.2 /100 WBC — ABNORMAL HIGH (ref 0.0–0.0)
Platelets: 167 10*3/uL (ref 142–346)
RBC: 2.86 10*6/uL — ABNORMAL LOW (ref 3.90–5.10)
RDW: 13 % (ref 11–15)
WBC: 9.03 10*3/uL (ref 3.10–9.50)

## 2019-06-06 LAB — HEMOLYSIS INDEX: Hemolysis Index: 0 (ref 0–18)

## 2019-06-06 LAB — GLUCOSE WHOLE BLOOD - POCT
Whole Blood Glucose POCT: 278 mg/dL — ABNORMAL HIGH (ref 70–100)
Whole Blood Glucose POCT: 236 mg/dL — ABNORMAL HIGH (ref 70–100)
Whole Blood Glucose POCT: 277 mg/dL — ABNORMAL HIGH (ref 70–100)
Whole Blood Glucose POCT: 188 mg/dL — ABNORMAL HIGH (ref 70–100)
Whole Blood Glucose POCT: 195 mg/dL — ABNORMAL HIGH (ref 70–100)

## 2019-06-06 LAB — GFR: EGFR: 12.2

## 2019-06-06 NOTE — Progress Notes (Addendum)
Medicine Nursing Note    Date Time: 06/06/19 9:15 AM  Patient Name: [name]  Attending Physician: Murlean Hark, MD    Assessment     Shift event: Pain Management   Neuro: A&O x 4, Calm and Cooperative    Tele/CV/CP No Tele, VSS, RA, Afebrile   Foley/GU N/A Pt voiding    BMs/GI Last BM 06/05/2019, on Consistent Carbohydrate/Renal 80g Protein Restriction Diet   Skin: Intact, Permacath Right Chest, Surgical Incision Left Abdomen   Isolation: N/A   Pain: Pt c/o Pain 8/10 Abdomen   MEWS: 1   Central Line: Permacath Right Upper Chest   Safety:  Placed Moderate fall risk. Safety maintained, purposeful rounding performed, Bed Alarm Activated. Pt walks with Gilford Rile and [employer]. Call bell within reach.    Interpreter: Pt speaks English and denies interpreter needs.   Comments:  Pt c/o pain and PRN medication given.   Medications given per order.   IV Site Flushed and Saline Locked    Provided comfort including repositioning, pain medication  Oral fluids offered. Hygiene supply provided.     No c/o Chest Pain  No c/o Cough and SOB.  No acute distress noted with breathing.  Denied Nausea and Vomiting.       The whiteboard was updated with current team information.   The belongings are at bedside.   Discussed and explained the care plan for the night.   Explained and educated Pt on safety precaution.

## 2019-06-06 NOTE — Plan of Care (Signed)
Problem: Safety  Goal: Patient will be free from injury during hospitalization  Flowsheets (Taken 06/06/2019 1517)  Patient will be free from injury during hospitalization:   Assess patient's risk for falls and implement fall prevention plan of care per policy   Provide and maintain safe environment   Use appropriate transfer methods   Ensure appropriate safety devices are available at the bedside   Include patient/ family/ care giver in decisions related to safety   Hourly rounding     Problem: Pain  Goal: Pain at adequate level as identified by patient  Flowsheets (Taken 06/06/2019 1517)  Pain at adequate level as identified by patient:   Assess for risk of opioid induced respiratory depression, including snoring/sleep apnea. Alert healthcare team of risk factors identified.   Identify patient comfort function goal     Problem: Altered GI Function  Goal: Nutritional intake is adequate  Flowsheets (Taken 06/06/2019 1517)  Nutritional intake is adequate:   Monitor daily weights   Assist patient with meals/food selection   Encourage/perform oral hygiene as appropriate   Allow adequate time for meals     Problem: Compromised Tissue integrity  Goal: Damaged tissue is healing and protected  Flowsheets (Taken 06/06/2019 1517)  Damaged tissue is healing and protected:   Monitor/assess Braden scale every shift   Provide wound care per wound care algorithm   Increase activity as tolerated/progressive mobility     Problem: Renal Instability  Goal: Fluid and electrolyte balance are achieved/maintained  Flowsheets (Taken 06/06/2019 1517)  Fluid and electrolyte balance are achieved/maintained:   Monitor intake and output every shift   Provide adequate hydration   Monitor daily weight   Monitor/assess lab values and report abnormal values   Assess for confusion/personality changes     Pt Aox4, VSS. Reported mild pain at the abdomen. Scheduled tylenol was given with good relief. On accucheck, coverage was given. Eating well and  Bmx1. Safety bundle on place.Maintained safety. Will follow plan of care.

## 2019-06-06 NOTE — Progress Notes (Signed)
[facility] PROGRESS NOTE  Aaron Edelman, x [phone] ([place])      Date Time: 06/06/19 12:06 PM  Patient Name: [name]  Attending Physician: Murlean Hark, MD    CC: follow-up ESRD    Assessment:     1. ESRD- previously on PD; switched to HD on  5/3   2. PD associated peritonitis - growing Pseudomonas and E. Coli, antibiotics change to zosyn --> po levoquin  - S/p PD catheter removal on 05/27/19  3. 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 Secondary hyperparathyroidism     Recommendations:     S/p HD yesterday  Next HD on Saturday  Will follow as outpatient    Stable from renal standpoint for SNF       Case discussed with: pt and HD nurse    Verl Blalock, MD  [facility]  [phone] (office)  X [phone] ([place])    Subjective: abdominal pain with bowel movements, otherwise feels well     Review of Systems:   Cardio:no chest pain  Pulm:no SOB  BB:[id number] at incision site    Physical Exam:     Vitals:    06/05/19 2314 06/06/19 0137 06/06/19 0320 06/06/19 0818   BP: 128/73 123/72 132/71 127/76   Pulse: 68 79 (!) 32 64   Resp: 17  17 16    Temp: 99.1 F (37.3 C)  98.4 F (36.9 C) 98.1 F (36.7 C)   TempSrc: Oral  Oral Oral   SpO2: 97% 97% 95% 97%   Weight:   111.2 kg (245 lb 2.4 oz)    Height:           Intake and Output Summary (Last 24 hours) at Date Time    Intake/Output Summary (Last 24 hours) at 06/06/2019 1206  Last data filed at 06/06/2019 0800  Gross per 24 hour   Intake 1100 ml   Output 3301 ml   Net -2201 ml       General: awake, alert, oriented x 3, no acute distress.  HEENT: sclera anicteric  Neck: supple  Extremities: improved LE edema  Neuro: A+O x 3, no gross motor/sensory deficits    Access R HDTC    Meds:      Scheduled Meds: PRN Meds:    acetaminophen, 1,000 mg, Oral, 4 times per day  amLODIPine, 5 mg, Oral, Q12H  atorvastatin, 40 mg, Oral, Daily  carvedilol, 25 mg, Oral, Q12H SCH  darbepoetin alfa, 60 mcg, Subcutaneous,  Weekly  docusate sodium, 100 mg, Oral, Daily  gabapentin, 300 mg, Oral, Daily  heparin (porcine), 5,000 Units, Subcutaneous, Q12H SCH  insulin glargine, 5 Units, Subcutaneous, Q12H  insulin lispro, 1-8 Units, Subcutaneous, TID AC  lactobacillus/streptococcus, 1 capsule, Oral, Daily  levoFLOXacin, 250 mg, Oral, Q48H  pantoprazole, 40 mg, Oral, QAM AC  sevelamer, 800 mg, Oral, TID MEALS  vancomycin, 125 mg, Oral, Q6H  zinc Oxide, , Topical, Q6H          Continuous Infusions:   sodium chloride 20 mL/hr at 05/27/19 1100    sodium chloride 15 mL/hr at 06/02/19 2057    lactated ringers      sodium chloride, , PRN  bisacodyl, 10 mg, Daily PRN  dextrose, 15 g of glucose, PRN   And  dextrose, 12.5 g, PRN   And  glucagon (rDNA), 1 mg, PRN  dextrose, 15 g of glucose, PRN   And  dextrose, 12.5 g, PRN   And  glucagon (rDNA), 1 mg, PRN  diphenhydrAMINE, 25 mg, Q6H PRN  guaiFENesin-dextromethorphan, 5 mL, Q4H PRN  naloxone, 0.2 mg, PRN  ondansetron, 4 mg, Once PRN  ondansetron, 4 mg, Q6H PRN   Or  ondansetron, 4 mg, Q6H PRN  oxyCODONE, 5 mg, Q6H PRN   Or  oxyCODONE, 10 mg, Q6H PRN  petrolatum, , PRN              Labs:     Recent Labs   Lab 06/06/19  0332 06/04/19  0536 06/03/19  0954   WBC 9.03 9.82* 8.18   Hgb 8.1* 7.6* 8.3*   Hematocrit 28.3* 26.5* 28.6*   Platelets 167 170 204     Recent Labs   Lab 06/06/19  0332 06/04/19  0536 06/03/19  0954 06/02/19  0345   Sodium 137 137  --  137   Potassium 4.2 4.2  --  4.8   Chloride 100 102  --  103   CO2 27 24  --  23   BUN 22.0* 23.0*  --  28.0*   Creatinine 4.4* 5.1*  --  5.8*   Calcium 7.7* 7.3*  --  7.0*   Phosphorus  --   --  3.8  --    Glucose 255* 263*  --  174*   EGFR 12.2 10.3  --  8.9         Signed by: Verl Blalock, MD

## 2019-06-06 NOTE — Progress Notes (Signed)
GENERAL SURGERY PROGRESS NOTE  [facility] [phone]    Date Time: 06/06/19 5:13 AM  Patient Name: [facility] Day: 8    ASSESSMENT:   10 Days Post-Op S/P Procedure(s):  REMOVAL, PERITONEAL DIALYSIS CATHETER  EXPLORATORY LAPAROTOMY    Mild erythema around wound stable from yesterday    PLAN:   Monitor wound erythema  Cont abx per ID  Staples out [id number]    SUBJECTIVE:   The patient is doing well.  Abdominal pain is unchanged, controlled well with PRN pain meds.  Current dietary status:  Glucerna Supplement Quantity: A. One; Flavor: Strawberry; Frequency: Daily with lunch  Diet consistent carbohydrate and renal Protein restriction: 80 GM Protein  Supervise For Meals Frequency: All meals and tolerating well.  Flatus: yes. BM:  yes.  Additional complaints: stools are soft, but having mild pain with BM's     Objective:   Current Vitals:   Vitals:    06/06/19 0320   BP: 132/71   Pulse: (!) 32   Resp: 17   Temp: 98.4 F (36.9 C)   SpO2: 95%       Intake and Output Summary (Last 24 hours):  I/O last 3 completed shifts:  In: 1590 [P.O.:1580; I.V.:10]  Out: 3000 [Other:3000]    Labs:     Results       Procedure Component Value Units Date/Time    CBC without differential [[id number]] Collected: 06/06/19 0332    Specimen: Blood Updated: 06/06/19 3710    Basic Metabolic Panel [[id number]] Collected: 06/06/19 0332    Specimen: Blood Updated: 06/06/19 0332    Glucose Whole Blood - POCT [[id number]]  (Abnormal) Collected: 06/05/19 2041     Updated: 06/05/19 2110     Whole Blood Glucose POCT 232 mg/dL     Glucose Whole Blood - POCT [[id number]]  (Abnormal) Collected: 06/05/19 1553     Updated: 06/05/19 1557     Whole Blood Glucose POCT 240 mg/dL     Glucose Whole Blood - POCT [[id number]]  (Abnormal) Collected: 06/05/19 1335     Updated: 06/05/19 1339     Whole Blood Glucose POCT 155 mg/dL             Rads:     Radiology Results (24 Hour)       ** No results found for the last 24 hours. **              Medications:      Current Facility-Administered Medications   Medication Dose Route Frequency    acetaminophen  1,000 mg Oral 4 times per day    amLODIPine  5 mg Oral Q12H    atorvastatin  40 mg Oral Daily    carvedilol  25 mg Oral Q12H SCH    darbepoetin alfa  60 mcg Subcutaneous Weekly    docusate sodium  100 mg Oral Daily    gabapentin  300 mg Oral Daily    heparin (porcine)  5,000 Units Subcutaneous Q12H [place]    insulin glargine  5 Units Subcutaneous Q12H    insulin lispro  1-8 Units Subcutaneous TID AC    lactobacillus/streptococcus  1 capsule Oral Daily    levoFLOXacin  250 mg Oral Q48H    pantoprazole  40 mg Oral QAM AC    sevelamer  800 mg Oral TID MEALS    vancomycin  125 mg Oral Q6H    zinc Oxide   Topical Q6H      sodium chloride 20  mL/hr at 05/27/19 1100    sodium chloride 15 mL/hr at 06/02/19 2057    lactated ringers       sodium chloride, bisacodyl, Nursing communication: Adult Hypoglycemia Treatment Algorithm **AND** dextrose **AND** dextrose **AND** glucagon (rDNA), Nursing communication: Adult Hypoglycemia Treatment Algorithm **AND** dextrose **AND** dextrose **AND** glucagon (rDNA), diphenhydrAMINE, guaiFENesin-dextromethorphan, naloxone, ondansetron, ondansetron **OR** ondansetron, oxyCODONE **OR** oxyCODONE, petrolatum     Physical Exam:     General appearance - alert, well appearing, and in no distress  Mental status - normal mood, behavior, speech, dress, motor activity, and thought processes  Chest - non-labored  Heart - normal rate and regular rhythm  Abdomen - soft, obese, mild peri-incisional tenderness with some erythema around the wound. No rebound or guarding. No fluctuance or induration.  Wound - clean, dry, no drainage, erythema  Extremities - intact peripheral pulses    Signed by: Sharon Seller      This patient was personally seen and examined by me and I agree with the assessment and plan above.  All notes, labs, and xrays were reviewed.  The patient was seen on the date the note was written.     Will see Monday if still here.  Discharge per primary    Iran Sizer , MD [place]  [facility]  [address] [place], [place] [address]  [phone]

## 2019-06-06 NOTE — OT Plan of Care Note (Signed)
Discharge Recommendation: SNF   DME Recommended for Discharge: Grab bars, Tub transfer bench, Front wheel walker, Medical alert system    OT Frequency Recommended: 2-3x/wk     Is PT evaluation indicated at this time? This patient is already on PT caseload.     PMP Activity: Step 6 - Walks in Room  Distance Walked (ft) (Step 6,7): 30 Feet(x2)  (Please See Therapy Evaluation for device and assistance level needed)    Treatment Interventions: ADL retraining, Functional transfer training, Endurance training, Patient/Family training, Equipment eval/education, UE strengthening/ROM       Goal Formulation: Patient  Time For Goal Achievement: by time of discharge  ADL Goals  Patient will dress lower body: Moderate Assist, by time of discharge, Not met  Pt will complete bathing: Contact Guard Assist, by time of discharge, Partly met  Mobility and Transfer Goals  Pt will transfer bed to [facility]: Minimal Assist, Goal met, New goal, Supervision  Pt will transfer bed to toilet: New goal, Supervision, with rolling walker, Not met  Neuro Re-Ed Goals  Pt will perform dynamic sitting balance: Modified Independent, by time of discharge, Goal met

## 2019-06-06 NOTE — OT Progress Note (Signed)
Occupational Therapy Note  [facility]  Occupational Therapy Treatment     Patient: REVE CROCKET     MRN#: [id number]   Unit: [place]  Bed: A2308/A2308.B    Time of treatment:  Time Calculation  OT Received On: 06/06/19  Start Time: 1530  Stop Time: 1608  Time Calculation (min): 38 min             Chart Review and Collaboration with Care Team: 8 minutes, not included in above time.    OT Visit Number: 3    Precautions and Contraindications:    Precautions  Weight Bearing Status: no restrictions  Precaution Instructions Given to Patient: Yes  Other Precautions: Falls    Personal Protective Equipment (PPE)  gloves, procedure mask and eye shield/covering    Updated Labs:  Lab Results   Component Value Date/Time    HGB 8.1 (L) 06/06/2019 03:32 AM    HCT 28.3 (L) 06/06/2019 03:32 AM    K 4.2 06/06/2019 03:32 AM    NA 137 06/06/2019 03:32 AM    INR 0.9 03/18/2006 03:00 PM    TROPI 0.04 05/17/2019 06:20 PM    TROPI 0.05 05/17/2019 01:37 PM    TROPI 0.01 03/18/2006 06:10 PM       All imaging reviewed, please see chart for details.    Subjective:    .  "I can't wipe because of the pain when I reach"   Patient Goal: toileting    Patient's medical condition is appropriate for Occupational Therapy intervention at this time.  Patient is agreeable to participation in the therapy session. Nursing clears patient for therapy.  Pain Assessment  Pain Assessment: (reports incisional pain when turning)  Pain Intervention(s): Distraction;Emotional support;Repositioned;Relaxation technique      Objective:  Observation of Patient/Vital Signs:    BP 107/66    Pulse 64    Temp 97.5 F (36.4 C) (Oral)    Resp 16    Ht 1.575 m (5\' 2" )    Wt 111.2 kg (245 lb 2.4 oz)    LMP  (LMP Unknown) Comment: post menopausal   SpO2 97%    BMI 44.84 kg/m       Cognition/Neuro Status  Arousal/Alertness: Appropriate responses to stimuli  Attention Span: Appears intact  Orientation Level: Oriented X4  Memory:  Appears intact  Safety Awareness: moderate verbal instruction  Insights: Fully aware of deficits;Educated in safety awareness  Behavior: attentive;calm;cooperative  Motor Planning: intact  Coordination: intact    Functional Mobility  Rolling: Modified Independent  Scooting Transfers: Supervision  Sit to Supine Transfers: Minimal Assist  Sit to Stand Transfers: Nurse, learning disability  Stand to Sit Transfers: Contact Guard Assist  Functional Mobility/Ambulation: Nurse, learning disability    Self-care and Home Management  Eating: Modified Independent  Grooming: Contact Guard Assist;standing at sink  Toileting: Moderate Assist;supervison/safety;verbal prompting;grab bar use;perineal hygiene         Neuro Re-Ed  Balance Training: Standing reaching activities;Sitting reaching activities       Educated the patient to role of occupational therapy, plan of care, goals of therapy and HEP, safety with mobility and ADLs, energy conservation techniques, pursed lip breathing, discharge instructions, home safety with verbalized understanding. Patient left in bed with alarm and all other medical equipment in place and call bell and all personal items/needs within reach.  RN notified of session outcome.      Assessment:  Pt somewhat impulsive and requires instruction for proper AE mgmt  and transfer techniques to improve safety. Difficulty with LB ADLs req cont'd AE training (bathroom buddy recommended, pt states will have daughter look into it as she does not use the internet.) Engages well t/o session and appears motivated to improve.     PMP Activity: Step 6 - Walks in Room  Distance Walked (ft) (Step 6,7): 30 Feet(x2)      Plan:  Goal Formulation: Patient      Time For Goal Achievement: by time of discharge  ADL Goals  Patient will dress lower body: Moderate Assist, by time of discharge, Not met  Pt will complete bathing: Contact Guard Assist, by time of discharge, Partly met  Mobility and Transfer Goals  Pt will transfer bed to [facility]:  Minimal Assist, Goal met, New goal, Supervision  Pt will transfer bed to toilet: New goal, Supervision, with rolling walker, Not met  Neuro Re-Ed Goals  Pt will perform dynamic sitting balance: Modified Independent, by time of discharge, Goal met                    Continue plan of care.       DME Recommended for Discharge: Grab bars, Tub transfer bench, Front wheel walker, Medical alert system  Discharge Recommendation: SNF      Keymari Sato, OTR/L  06/06/2019  4:36 PM

## 2019-06-06 NOTE — Progress Notes (Signed)
INFECTIOUS DISEASES PROGRESS NOTE    Date Time: 06/06/19 12:45 PM  Patient Name: [name]  Patient status.Inpatient  Hospital Day: 8    Assessment and Plan:   1.  PD catheter peritonitis/Pseudomonas          Plan: 1. PO Levaquin  2.  Continue p.o. vancomycin  3.  [place] both on Monday  Subjective:   Nontoxic    Review of Systems:   Review of Systems -abdominal pain around old catheter site  No diarrhea    Antibiotics:   Day 10    Other medications reviewed in EPIC.  Central Access:   New dialysis access day 4    Physical Exam:     Vitals:    06/06/19 1213   BP: 107/66   Pulse: 64   Resp: 16   Temp: 97.5 F (36.4 C)   SpO2: 97%       Lungs: Clear  Heart: Regular rhythm  Abdomen: Bowel sounds heard.  Wound healed.  Soft    Labs:     Microbiology Results     Procedure Component Value Units Date/Time    Blood Culture Aerobic/Anaerobic #1 [[id number]] Collected: 05/27/19 0507    Specimen: Arm from Blood, Venipuncture Updated: 05/27/19 0950    Narrative:      1 BLUE+1 PURPLE    Blood Culture Aerobic/Anaerobic #2 [[id number]] Collected: 05/27/19 0507    Specimen: Arm from Blood, Venipuncture Updated: 05/27/19 0950    Narrative:      1 BLUE+1 PURPLE    COVID-19 (SARS-COV-2) [employer] [facility]) [[id number]] Collected: 05/27/19 0624    Specimen: Nasopharyngeal Swab from Nasopharynx Updated: 05/27/19 0656     Purpose of COVID testing Screening     SARS-CoV-2 Specimen Source Nasopharyngeal     SARS CoV 2 Overall Result Negative     Comment: Test performed using the Abbott ID NOW EUA assay.  Please see Fact Sheets for patients and providers located at:  http://olson-hall.info/  This test is for the qualitative detection of SARS-CoV-2  (COVID19) nucleic acid. Viral nucleic acids may persist in vivo,  independent of viability. Detection of viral nucleic acid does  not imply the presence of infectious virus, or that virus  nucleic acid is the cause of clinical symptoms. Negative  results should be treated as  presumptive and, if inconsistent  with clinical signs and symptoms or necessary for patient  management, should be tested with an alternative molecular  assay. Negative results do not preclude SARS-CoV-2 infection  and should not be used as the sole basis for patient  management decisions. Invalid results may be due to inhibiting  substances in the specimen and recollection should occur.         Narrative:      o Collect and clearly label specimen type:  o Upper respiratory specimen: One Nasopharyngeal Dry Swab NO  Transport Media.  o Hand deliver to laboratory ASAP  Indication for testing->Extended care facility admission to  semi private room    Culture + Gram Lenise Arena, Body Fluid [[id number]] Collected: 05/27/19 0534    Specimen: Body Fluid from Paracentesis Fluid Updated: 05/27/19 1009          CBC w/Diff CMP   Recent Labs   Lab 06/06/19  0332 06/04/19  0536 06/03/19  0954   WBC 9.03 9.82* 8.18   Hgb 8.1* 7.6* 8.3*   Hematocrit 28.3* 26.5* 28.6*   Platelets 167 170 204   MCV 99.0* 99.3* 98.6*   Neutrophils  --   --  67.5       PT/INR         Recent Labs   Lab 06/06/19  0332 06/04/19  0536 06/03/19  0954 06/02/19  0345   Sodium 137 137  --  137   Potassium 4.2 4.2  --  4.8   Chloride 100 102  --  103   CO2 27 24  --  23   BUN 22.0* 23.0*  --  28.0*   Creatinine 4.4* 5.1*  --  5.8*   Glucose 255* 263*  --  174*   Calcium 7.7* 7.3*  --  7.0*   Phosphorus  --   --  3.8  --       Glucose POCT   Recent Labs   Lab 06/06/19  0332 06/04/19  0536 06/02/19  0345   Glucose 255* 263* 174*          Rads:     Radiology Results (24 Hour)     ** No results found for the last 24 hours. **            Signed by: Ernst Breach

## 2019-06-06 NOTE — Discharge Summary (Signed)
[facility]      Patient: [name]  Admission Date: 05/27/2019   DOB: 12/15/1955  Discharge Date: 06/04/2019   MRN: [id number]  Discharge Attending:Damarea Merkel     Referring Physician: Pcp, None, MD  PCP: Pcp, None, MD       DISCHARGE SUMMARY     Discharge Information   Admission Diagnosis:   Generalized abdominal pain    Discharge Diagnosis:   Active Hospital Problems    Diagnosis    Bowel incontinence    Thrombocytopenia    Hypotension    Type 2 diabetes mellitus, with long-term current use of insulin    GERD (gastroesophageal reflux disease)    Generalized abdominal pain    ESRD (end stage renal disease) on dialysis    Peritonitis    Peritoneal dialysis catheter dysfunction, initial encounter        Admission Condition: abdominal pain   Discharge Condition: Stable  Consultants: Infectious disease, surgery, nephrology  Functional Status: Ambulating with walker  Discharged to: SNF  Procedures: PD catheter removal  Surgeries: PD catheter removal    Imaging:     CT Abd/Pelvis without Contrast    Result Date: 05/12/2019   INTRAPERITONEAL FLUID/ASCITES (? RELATED TO THE PERITONEAL DIALYSIS) Leonard Downing, MD  05/12/2019 9:42 PM    CT Angiogram Chest    Result Date: 05/18/2019     No evidence of pulmonary embolism. Small bilateral pleural effusions. Large hiatal hernia. Trace perihepatic ascites. Other nonurgent findings as above. Lonia Skinner, MD  05/18/2019 9:58 AM    CT Abd/Pelvis with IV Contrast only    Result Date: 05/27/2019  1. Fluid-filled mildly distended loops of small bowel with distal decompressed small bowel loops. Findings may represent early or partial small bowel obstruction. Enteritis is felt to be less likely. 2. Trace bilateral pleural effusions. 3. Additional chronic findings as above. Delaney Meigs, MD  05/27/2019 6:07 AM    Tunneled Cath Placement (Permcath)    Result Date: 05/23/2019   Technically successful placement of an  23cm Equistream catheter via the right internal jugular vein as  described above. Phlebitic changes in the right internal jugular vein from prior catheter placement. Dimitrios Papadouris, MD  05/23/2019 3:51 PM     Echo Results     None        Discharge Medications:     Medication List      START taking these medications    lactobacillus/streptococcus Caps  Take 1 capsule by mouth daily     levoFLOXacin 250 MG tablet  Commonly known as: LEVAQUIN  Take 1 tablet (250 mg total) by mouth every other day for 6 days     oxyCODONE 5 MG immediate release tablet  Commonly known as: ROXICODONE  Take 1 tablet (5 mg total) by mouth every 6 (six) hours as needed for Pain     vancomycin HCl 250 MG/5ML oral solution  Commonly known as: FIRVANQ  Take 2.5 mLs (125 mg total) by mouth every 6 (six) hours for 7 days     zinc Oxide 40 % Pste paste  Commonly known as: DESITIN  Apply topically every 6 (six) hours        CHANGE how you take these medications    carvedilol 25 MG tablet  Commonly known as: COREG  Take 1 tablet (25 mg total) by mouth every 12 (twelve) hours  What changed: when to take this     * insulin glargine 100 UNIT/ML injection  Commonly known as:  LANTUS  Inject 10 Units into the skin every morning  What changed: Another medication with the same name was added. Make sure you understand how and when to take each.     * insulin glargine 100 UNIT/ML injection  Commonly known as: LANTUS  Inject 5 Units into the skin every 12 (twelve) hours  What changed: You were already taking a medication with the same name, and this prescription was added. Make sure you understand how and when to take each.     Tresiba 100 UNIT/ML Soln  Generic drug: Insulin Degludec  What changed: Another medication with the same name was removed. Continue taking this medication, and follow the directions you see here.         * This list has 2 medication(s) that are the same as other medications prescribed for you. Read the directions carefully, and ask your doctor or other care provider to review them with you.             CONTINUE taking these medications    amLODIPine 5 MG tablet  Commonly known as: NORVASC  Take 1 tablet (5 mg total) by mouth every 12 (twelve) hours     atorvastatin 40 MG tablet  Commonly known as: LIPITOR     famotidine 40 MG tablet  Commonly known as: PEPCID     gabapentin 600 MG tablet  Commonly known as: NEURONTIN     insulin aspart 100 UNIT/ML injection  Commonly known as: NovoLOG     pantoprazole 40 MG tablet  Commonly known as: PROTONIX  Take 1 tablet (40 mg total) by mouth 2 (two) times daily     sevelamer 800 MG tablet  Commonly known as: RENVELA     Trulicity 5.07 KU/5.7DY Sopn  Generic drug: Dulaglutide  Notes to patient: Resume as outpatient schedule        STOP taking these medications    furosemide 40 MG tablet  Commonly known as: LASIX     oxyCODONE-acetaminophen 5-325 MG per tablet  Commonly known as: PERCOCET           Where to Get Your Medications      Information about where to get these medications is not yet available    Ask your nurse or doctor about these medications   carvedilol 25 MG tablet   insulin glargine 100 UNIT/ML injection   lactobacillus/streptococcus Caps   levoFLOXacin 250 MG tablet   oxyCODONE 5 MG immediate release tablet   vancomycin HCl 250 MG/5ML oral solution   zinc Oxide 40 % Pste paste             Hospital Course   Presentation History   Per H&P "DICKIE LABARRE is a 64 y.o. female with a PMHx of  hypertension, hyperlipidemia, insulin-dependent diabetes mellitus, COPD, end-stage renal disease on peritoneal dialysis   Recently switched to hemodialysis and discharged yesterday return to the ED with recurrent abdominal pain associated with Non bloody diarrhea, nausea vomiting of nonbilious and nonbloody vomitus.  Patient states that she continued to have loose watery stools on arriving at home yesterday followed by nausea vomiting 2-3 times, described abdominal pain as left-sided pain intensity greater than 10, pain is worse with palpation.  She denies  fever/chills, dysuria, chest pain, shortness of breath, blood in stool or vomitus.  She had dialysis yesterday prior to discharge and have not arranged transportation for outpatient dialysis yet.    In the emergency room, CT abdomen pelvis showed partial small bowel  obstruction, trace bilateral pleural effusions, revealed WBC of 15.22, H&H of 10.4/34.1, stool studies pending, infectious disease Dr. Lahoma Crocker and Surgery Dr. Signa Kell consulted".    Hospital Course (8 Days):      Patient was admitted for PD peritonitis.  Peritoneal fluid grew Pseudomonas.  ID recommended IV Zosyn which was deescalated to Cipro based on susceptibility.  ID recommends continuing antibiotics till 5/19.    Patient was also treated prophylactically for C. difficile.  P.o. vancomycin was used.  ID recommend continue p.o. vancomycin till 5/19. Patient's PD catheter was removed and she was started on hemodialysis.  Outpatient hemodialysis was set up prior to discharge. PT assessed patient and recommended SNF.      Patient Active Hospital Problem List:   Generalized abdominal pain (05/27/2019)   Peritoneal dialysis catheter dysfunction, initial encounter (05/12/2019)   Peritonitis (05/24/2019)    Assessment: PD catheter removed by general surgery with exploratory laparotomy on 05/28/2019. Pleural fluid growing Pseudomonas and E. Coli.  ID has been following.    Plan: Appreciate ID inputs  Switched from Zosyn to oral Cipro prior to discharge to skilled nursing facility   Oral vancomycin 125 mg every 6 hours has been continued on account of loose stools  incentive spirometry use encouraged   Per ID Cipro PO and vancomycin PO to continue until 5/19.  Additional pain regimen as below.     Bowel incontinence   Assessment-unclear if this is a complication of prior back injury; however this problem is chronic and has been going on for the past year  Plan-minimize risk for bedsores with frequent turning and quick response to any accidents     ESRD (end stage  renal disease) on dialysis (05/24/2019)    Assessment: Patient was on PD prior to admission; she has been switched to hemodialysis for now; outpatient HD has been arranged for    Plan: Appreciate nephrology inputs    Outpatient dialysis to continue on a Tuesday Thursday Saturday schedule     Thrombocytopenia (05/31/2019)    Assessment: Could be due to the acute infection as above; platelet counts are stable    Plan: Okay to continue heparin for DVT prophylaxis  Monitor daily CBC     Hypotension (05/31/2019)    Assessment: Likely due to the infection above    Plan: Antibiotics as above     Type 2 diabetes mellitus, with long-term current use of insulin (05/31/2019)    Assessment: Chronic and unknown control    Plan: Continue with insulin as above     GERD (gastroesophageal reflux disease) (05/31/2019)    Assessment: Chronic and unknown control    Plan: c/w home dose of protonix   started maalox     RECOMMENDATIONS:    - Patient was informed of abnormal and incidental imaging findings during hospitalization, and advised to review this information with their  medical provider.       Best Practices   Was the patient admitted with either a CHF Exacerbation or Pneumonia? NO     Progress Note/Physical Exam at Discharge     Subjective: Patient reported feeling well, and is ready for discharge.    Vitals:    06/06/19 0818 06/06/19 1213 06/06/19 1724 06/06/19 1919   BP: 127/76 107/66 137/77 148/76   Pulse: 64 64 70 75   Resp: 16 16 16 17    Temp: 98.1 F (36.7 C) 97.5 F (36.4 C) 97.9 F (36.6 C) 97.9 F (36.6 C)   TempSrc: Oral Oral Oral Oral  SpO2: 97% 97% 99% 98%   Weight:       Height:           Constitutional: She is oriented to person, place, and time. She appears well-developed. No distress.   HENT:    Head: Normocephalic and atraumatic.   Eyes: EOM are normal. Pupils are equal, round, and reactive to light.   Neck: Normal range of motion. Neck supple. No tracheal deviation present. No thyromegaly present.    Cardiovascular: Normal rate, regular rhythm, normal heart sounds and intact distal pulses.    No murmur heard.  Pulmonary/Chest: Effort normal and breath sounds normal. No respiratory distress.   Abdominal: Soft. Bowel sounds are normal. Surgical site in the LUQ with mild erythema and tenderness with staples still in place.   Musculoskeletal: Normal range of motion.   Neurological: She is alert and oriented to person, place, and time.   Skin: Skin is warm and dry. She is not diaphoretic.   Psychiatric: She has a normal mood and affect. Her behavior is normal. Judgment and thought content normal.          Diagnostics     Labs/Studies Pending at Discharge:    G[facility] Blue   Lab 06/06/19  0332 06/04/19  0536 06/03/19  0954   WBC 9.03 9.82* 8.18   RBC 2.86* 2.67* 2.90*   Hgb 8.1* 7.6* 8.3*   Hematocrit 28.3* 26.5* 28.6*   MCV 99.0* 99.3* 98.6*   Platelets 167 170 204       Recent Labs   Lab 06/06/19  0332 06/04/19  0536 06/02/19  0345   Sodium 137 137 137   Potassium 4.2 4.2 4.8   Chloride 100 102 103   CO2 27 24 23    BUN 22.0* 23.0* 28.0*   Creatinine 4.4* 5.1* 5.8*   Glucose 255* 263* 174*   Calcium 7.7* 7.3* 7.0*       Microbiology Results     Procedure Component Value Units Date/Time    Anaerobic culture [[id number]] Collected: 05/27/19 2343    Specimen: Other from Wound Updated: 06/02/19 0822    Narrative:      ORDER#: [id number]                                    ORDERED BY: Kalman Jewels  SOURCE: Wound abdominal wound                        COLLECTED:  05/27/19 23:43  ANTIBIOTICS AT COLL.:                                RECEIVED :  05/28/19 07:56  Culture, Anaerobic Bacteria                FINAL       06/02/19 08:22  06/02/19   No anaerobic growth      Anaerobic culture [[id number]] Collected: 05/27/19 2343    Specimen: Other from Drainage Updated: 06/02/19 2725    Narrative:      blue swab  ORDER#: [id number]                                    ORDERED BY: Kalman Jewels  SOURCE: Drainage peritoneal fluid  COLLECTED:  05/27/19 23:43  ANTIBIOTICS AT COLL.:                                RECEIVED :  05/28/19 07:56  ORDER ENTRY COMMENTS:  blue swab  Culture, Anaerobic Bacteria                FINAL       06/02/19 08:22  06/02/19   No anaerobic growth      Blood Culture Aerobic/Anaerobic #1 [[id number]] Collected: 05/27/19 0507    Specimen: Arm from Blood, Venipuncture Updated: 06/01/19 1221    Narrative:      ORDER#: [id number]                                    ORDERED BY: CHUNG, SORA  SOURCE: Blood, Venipuncture arm                      COLLECTED:  05/27/19 05:07  ANTIBIOTICS AT COLL.:                                RECEIVED :  05/27/19 09:50  Culture Blood Aerobic and Anaerobic        FINAL       06/01/19 12:21  06/01/19   No growth after 5 days of incubation.      Blood Culture Aerobic/Anaerobic #2 [[id number]] Collected: 05/27/19 0507    Specimen: Arm from Blood, Venipuncture Updated: 06/01/19 1221    Narrative:      ORDER#: [id number]                                    ORDERED BY: CHUNG, SORA  SOURCE: Blood, Venipuncture arm                      COLLECTED:  05/27/19 05:07  ANTIBIOTICS AT COLL.:                                RECEIVED :  05/27/19 09:50  Culture Blood Aerobic and Anaerobic        FINAL       06/01/19 12:21  06/01/19   No growth after 5 days of incubation.      COVID-19 (SARS-COV-2) [employer] [facility]) [[id number]] Collected: 05/27/19 0624    Specimen: Nasopharyngeal Swab from Nasopharynx Updated: 05/27/19 0656     Purpose of COVID testing Screening     SARS-CoV-2 Specimen Source Nasopharyngeal     SARS CoV 2 Overall Result Negative     Comment: Test performed using the Abbott ID NOW EUA assay.  Please see Fact Sheets for patients and providers located at:  http://olson-hall.info/  This test is for the qualitative detection of SARS-CoV-2  (COVID19) nucleic acid. Viral nucleic acids may persist in vivo,  independent of viability. Detection of viral nucleic acid does  not imply the  presence of infectious virus, or that virus  nucleic acid is the cause of clinical symptoms. Negative  results should be treated as presumptive and, if inconsistent  with clinical signs and symptoms or necessary for patient  management, should be tested with an alternative molecular  assay. Negative results do not preclude SARS-CoV-2 infection  and should not be used as the sole basis for patient  management decisions. Invalid results may be due to inhibiting  substances in the specimen and recollection should occur.         Narrative:      o Collect and clearly label specimen type:  o Upper respiratory specimen: One Nasopharyngeal Dry Swab NO  Transport Media.  o Hand deliver to laboratory ASAP  Indication for testing->Extended care facility admission to  semi private room    Culture + Gram Lenise Arena, Body Fluid [[id number]] Collected: 05/27/19 0534    Specimen: Body Fluid from Paracentesis Fluid Updated: 06/01/19 1601    Narrative:      [id number] called Micro Results of Gram Stain. Results read back by: [id number], by  [id number] on 05/27/2019 at 14:31  < 70ml  ORDER#: [id number]                                    ORDERED BY: CHUNG, SORA  SOURCE: Paracentesis Fluid peritoneal dialysis cathetCOLLECTED:  05/27/19 05:34  ANTIBIOTICS AT COLL.:                                RECEIVED :  05/27/19 10:09  ORDER ENTRY COMMENTS:  < 41ml  [id number] called Micro Results of Gram Stain. Results read back by: [id number], by [id number] on 05/27/2019 at 14:31  Stain, Gram                                FINAL       05/27/19 14:31  05/27/19   No Squamous epithelial cells seen             No WBCs seen             Rare Gram positive cocci             Stain performed on Cytospin (concentrated) specimen  Culture and Gram Stain, Aerobic, Body FluidFINAL       06/01/19 16:01  05/30/19   The volume of sample received with this order is less than             the recommended volume. Submission of low volumes may             adversely affect recovery of pathogens  and culture results             may be compromised.  06/01/19   ** No growth             ** Note:             ** Gram positive cocci seen in Gram Stain did not grow under             ** aerobic and anaerobic culture conditions, possible nonviable.      Fungus culture [[id number]] Collected: 05/27/19 2343    Specimen: Other from Wound Updated: 06/04/19 1529    Narrative:      ORDER#: [id number]                                    ORDERED BY: Kalman Jewels  SOURCE: Wound abdominal wound  COLLECTED:  05/27/19 23:43  ANTIBIOTICS AT COLL.:                                RECEIVED :  05/28/19 07:56  Stain, Fungal                              FINAL       05/29/19 12:20  05/29/19   No Fungal or Yeast Elements Seen  Culture Fungus                             PRELIM      06/04/19 15:29  06/04/19   No growth after 1 week/s of incubation.      Fungus culture [[id number]] Collected: 05/27/19 0259    Specimen: Other from Drainage Updated: 06/04/19 1529    Narrative:      blue swab  ORDER#: [id number]                                    ORDERED BY: Kalman Jewels  SOURCE: Drainage peritoneal fluid                    COLLECTED:  05/27/19 02:59  ANTIBIOTICS AT COLL.:                                RECEIVED :  05/28/19 07:56  ORDER ENTRY COMMENTS:  blue swab  Stain, Fungal                              FINAL       05/29/19 12:20  05/29/19   No Fungal or Yeast Elements Seen  Culture Fungus                             PRELIM      06/04/19 15:29  06/04/19   No growth after 1 week/s of incubation.      Wound culture & gram stain [[id number]] Collected: 05/27/19 2343    Specimen: Wound Updated: 05/30/19 0829    Narrative:      blue swab  ORDER#: [id number]                                    ORDERED BY: Kalman Jewels  SOURCE: Wound peritoneal fluid                       COLLECTED:  05/27/19 23:43  ANTIBIOTICS AT COLL.:                                RECEIVED :  05/28/19 07:56  ORDER ENTRY COMMENTS:  blue swab  Stain, Gram                                 FINAL       05/28/19 13:06  05/28/19   Few WBCs  Rare Squamous epithelial cells             No organisms seen  Culture and Gram Stain, Aerobic, Wound     FINAL       05/30/19 08:29   +  05/29/19   Light growth of Pseudomonas aeruginosa               Refer to susceptibilities on culture #[id number]        Wound culture & gram stain [[id number]] Collected: 05/27/19 0221    Specimen: Wound Updated: 05/30/19 0828    Narrative:      ORDER#: [id number]                                    ORDERED BY: Kalman Jewels  SOURCE: Wound abdominal wound                        COLLECTED:  05/27/19 02:21  ANTIBIOTICS AT COLL.:                                RECEIVED :  05/28/19 07:56  Stain, Gram                                FINAL       05/28/19 13:03  05/28/19   Moderate WBCs             Few Squamous epithelial cells             No organisms seen  Culture and Gram Stain, Aerobic, Wound     FINAL       05/30/19 08:28   +  05/30/19   Light growth of Pseudomonas aeruginosa      _____________________________________________________________________________                                  P.aeruginosa    ANTIBIOTICS                     MIC  INTRP      _____________________________________________________________________________  Amikacin                        <=8    S        Aztreonam                        8     S        Cefepime                         2     S        Ceftazidime                     <=2    S        Ciprofloxacin                 <=0.25   S        Gentamicin                      <=2  S        Levofloxacin                   <=0.5   S        Meropenem                      <=0.5   S        Piperacillin/Tazobactam         4/4    S        Tobramycin                      <=2    S        _____________________________________________________________________________            S=SUSCEPTIBLE     I=INTERMEDIATE     R=RESISTANT                            N/S=NON-SUSCEPTIBLE   _____________________________________________________________________________             Patient Instructions   Discharge Diet: Heart healthy  Discharge Activity: As tolerated  LABS/TESTING recommended after discharge    Follow Up Appointment:  Follow-up Information     Herbert Moors., MD. Schedule an appointment as soon as possible for a visit in 10 day(s).    Specialty: Surgery  Why: for staple removal  Contact information:  [address]  [place] [address]  [phone]             Pcp, None, MD .           Ulyses Amor, MD. Schedule an appointment as soon as possible for a visit in 1 week(s).    Specialties: Nephrology, Internal Medicine  Contact information:  [address]  [address] [address]  [phone]             Ernst Breach, MD. Schedule an appointment as soon as possible for a visit.    Specialties: Infectious Disease, Internal Medicine  Contact information:  [place] [address]  [phone]                    Time spent examining patient, discussing with patient/family regarding hospital course, chart review, reconciling medications and discharge planning: > 35 minutes.  This patient was examined by me on , the day of discharge.    Weldon Picking    9:27 PM 06/06/2019

## 2019-06-06 NOTE — OT Plan of Care Note (Deleted)
Discharge Recommendation: SNF   DME Recommended for Discharge: Grab bars, Tub transfer bench, Front wheel walker, Medical alert system    OT Frequency Recommended: 2-3x/wk     Is PT evaluation indicated at this time? This patient is already on PT caseload.     PMP Activity: Step 6 - Walks in Room  Distance Walked (ft) (Step 6,7): 30 Feet(x2)  (Please See Therapy Evaluation for device and assistance level needed)    Treatment Interventions: ADL retraining, Functional transfer training, Endurance training, Patient/Family training, Equipment eval/education, UE strengthening/ROM       Goal Formulation: Patient  Time For Goal Achievement: by time of discharge  ADL Goals  Patient will dress lower body: Moderate Assist, by time of discharge, Not met  Pt will complete bathing: Contact Guard Assist, by time of discharge, Partly met  Mobility and Transfer Goals  Pt will transfer bed to [facility]: Minimal Assist, Goal met, New goal, Supervision  Pt will transfer bed to toilet: New goal, Supervision, with rolling walker, Not met  Neuro Re-Ed Goals  Pt will perform dynamic sitting balance: Modified Independent, by time of discharge, Goal met

## 2019-06-06 NOTE — Plan of Care (Signed)
Problem: Moderate/High Fall Risk Score >5  Goal: Patient will remain free of falls  Outcome: Progressing  Flowsheets (Taken 06/05/2019 2100)  Moderate Risk (6-13):   MOD-Consider activation of bed alarm if appropriate   MOD-Apply bed exit alarm if patient is confused   MOD-Floor mat at bedside (where available) if appropriate   MOD-Remain with patient during toileting   MOD-Perform dangle, stand, walk (DSW) when getting patient up   MOD-Request PT/OT consult order for patients with gait/mobility impairment  High (Greater than 13):   HIGH-Visual cue at entrance to patient's room   HIGH-Bed alarm on at all times while patient in bed   HIGH-Utilize chair pad alarm for patient while in the chair   HIGH-Apply yellow "Fall Risk" arm band   HIGH-Initiate use of floor mats as appropriate   HIGH-Consider use of low bed     Problem: Safety  Goal: Patient will be free from injury during hospitalization  Outcome: Progressing  Flowsheets (Taken 06/05/2019 1501 by Filbert Berthold, RN)  Patient will be free from injury during hospitalization:   Assess patient's risk for falls and implement fall prevention plan of care per policy   Provide and maintain safe environment   Use appropriate transfer methods   Hourly rounding  Goal: Patient will be free from infection during hospitalization  Outcome: Progressing  Flowsheets (Taken 06/05/2019 1501 by Filbert Berthold, RN)  Free from Infection during hospitalization:   Monitor lab/diagnostic results   Assess and monitor for signs and symptoms of infection   Monitor all insertion sites (i.e. indwelling lines, tubes, urinary catheters, and drains)     Problem: Pain  Goal: Pain at adequate level as identified by patient  Outcome: Progressing  Flowsheets (Taken 06/05/2019 1501 by Filbert Berthold, RN)  Pain at adequate level as identified by patient:   Identify patient comfort function goal   Assess pain on admission, during daily assessment and/or before any "as needed"  intervention(s)   Reassess pain within 30-60 minutes of any procedure/intervention, per Pain Assessment, Intervention, Reassessment (AIR) Cycle   Offer non-pharmacological pain management interventions   Consult/collaborate with Pain Service   Evaluate patient's satisfaction with pain management progress     Problem: Altered GI Function  Goal: Elimination patterns are normal or improving  Outcome: Progressing  Flowsheets (Taken 06/01/2019 0503 by Trevor Iha, RN)  Elimination patterns are normal or improving:   Report abnormal assessment to physician   Anticipate/assist with toileting needs   Assess for normal bowel sounds   Monitor for abdominal distension   Monitor for abdominal discomfort  Goal: Nutritional intake is adequate  Outcome: Progressing  Flowsheets (Taken 05/29/2019 1249 by Priscille Heidelberg, RN)  Nutritional intake is adequate:   Monitor daily weights   Allow adequate time for meals   Assist patient with meals/food selection  Goal: Mobility/Activity is maintained at optimal level for patient  Outcome: Progressing  Flowsheets (Taken 05/29/2019 1249 by Priscille Heidelberg, RN)  Mobility/activity is maintained at optimal level for patient:   Increase mobility as tolerated/progressive mobility   Encourage independent activity per ability   Maintain proper body alignment     Problem: Compromised Tissue integrity  Goal: Damaged tissue is healing and protected  Outcome: Progressing  Flowsheets (Taken 06/05/2019 1501 by Filbert Berthold, RN)  Damaged tissue is healing and protected:   Monitor/assess Braden scale every shift   Provide wound care per wound care algorithm   Keep intact skin clean and dry   Avoid shearing  injuries   Use bath wipes, not soap and water, for daily bathing   Increase activity as tolerated/progressive mobility   Encourage use of lotion/moisturizer on skin   Monitor patient's hygiene practices     Problem: Renal Instability  Goal: Free from infection  Outcome: Progressing  Flowsheets  (Taken 06/05/2019 1040 by Mickel Baas, RN)  Free from infection: Monitor/assess for signs and symptoms of infection     Problem: Patient Receiving Advanced Renal Therapies  Goal: Therapy access site remains intact  Outcome: Progressing  Flowsheets (Taken 06/05/2019 0945 by Mickel Baas, RN)  Therapy access site remains intact:   Assess therapy access site   Change therapy access site dressing as needed

## 2019-06-07 LAB — GLUCOSE WHOLE BLOOD - POCT
Whole Blood Glucose POCT: 217 mg/dL — ABNORMAL HIGH (ref 70–100)
Whole Blood Glucose POCT: 283 mg/dL — ABNORMAL HIGH (ref 70–100)
Whole Blood Glucose POCT: 213 mg/dL — ABNORMAL HIGH (ref 70–100)
Whole Blood Glucose POCT: 248 mg/dL — ABNORMAL HIGH (ref 70–100)

## 2019-06-07 MED ORDER — TRIPLE ANTIBIOTIC 3.5-400-5000 EX OINT
TOPICAL_OINTMENT | Freq: Two times a day (BID) | CUTANEOUS | Status: DC
Start: 2019-06-07 — End: 2019-06-13
  Administered 2019-06-07 – 2019-06-09 (×4): 15 g via TOPICAL
  Filled 2019-06-07 (×7): qty 0.9

## 2019-06-07 MED ORDER — SODIUM CHLORIDE 0.9 % IV BOLUS
250.0000 mL | INTRAVENOUS | Status: AC | PRN
Start: 2019-06-07 — End: 2019-06-07

## 2019-06-07 MED ORDER — SODIUM CHLORIDE 0.9 % IV BOLUS
100.0000 mL | INTRAVENOUS | Status: AC | PRN
Start: 2019-06-07 — End: 2019-06-07

## 2019-06-07 MED ORDER — TRIPLE ANTIBIOTIC 3.5-400-5000 EX OINT
TOPICAL_OINTMENT | Freq: Two times a day (BID) | CUTANEOUS | 0 refills | Status: AC
Start: 2019-06-07 — End: 2019-06-14

## 2019-06-07 NOTE — Progress Notes (Signed)
3.5hRs. HD tx. Completed tolerated well; Net fluid off 3.0L; Pt. Hemodynamically stable; Rt. CVC worked very well. Dressing Dry/ intact.No complaint of discomfort post tx. ; Report given to Carmon Sails RN.     06/07/19 1705   Treatment Summary   Time Off Machine 1657   Duration of Treatment (Hours) 3.5   Dialyzer Clearance Moderately streaked   Fluid Volume Off (mL) 3500   Prime Volume (mL) 200   Rinseback Volume (mL) 200   Fluid Given: Normal Saline (mL) 100   Fluid Given: PRBC  0 mL   Fluid Given: Albumin (mL) 0   Fluid Given: Other (mL) 0   Total Fluid Given 500   Hemodialysis Net Fluid Removed 3000   Post Treatment Assessment   Post-Treatment Weight (Kg) 107   Patient Response to Treatment tolerated well   Additional Dialyzer Used 0   Vitals   Temp 98 F (36.7 C)   Heart Rate 64   Resp Rate 18   SpO2 100 %   O2 Device None (Room air)   Assessment   Mental Status Alert;Oriented;Cooperative   Cardiac (WDL) WDL   Cardiac Regularity Regular   Cardiac Symptoms None   Cardiac Rhythm Normal Sinus Rhythm   Respiratory  WDL   Respiratory Pattern Regular   Bilateral Breath Sounds Clear   R Breath Sounds Clear   L Breath Sounds Clear   Edema  WDL   Generalized Edema None   Facial None   Sacral UTA   RLE Edema +1   LLE Edema +1   General Skin Color Appropriate for ethnicity   Skin Condition/Temp Warm;Dry   Gastrointestinal (WDL) WDL   Abdomen Inspection Soft;Rounded   GI Symptoms None   Mobility Ambulatory with Assistance   Pain Assessment   Charting Type Assessment   Pain Scale Used Numeric Scale (0-10)   Numeric Pain Scale   Pain Score 0   POSS Score 1   Pain Location Abdomen   Education   Person taught Patient   Knowledge basis Minimal   Topics taught Access care   Teaching Tools Explain   [name] Understanding   Bedside Nurse Communication   Name of bedside RN - post dialysis Carmon Sails

## 2019-06-07 NOTE — Plan of Care (Signed)
Problem: Moderate/High Fall Risk Score >5  Goal: Patient will remain free of falls  Outcome: Progressing  Flowsheets (Taken 06/06/2019 2100)  Moderate Risk (6-13):   MOD-Consider activation of bed alarm if appropriate   MOD-Apply bed exit alarm if patient is confused   MOD-Floor mat at bedside (where available) if appropriate   MOD-Consider a move closer to [place]   MOD-Remain with patient during toileting   MOD-Perform dangle, stand, walk (DSW) when getting patient up   MOD-Request PT/OT consult order for patients with gait/mobility impairment     Problem: Safety  Goal: Patient will be free from injury during hospitalization  Outcome: Progressing  Flowsheets (Taken 06/06/2019 1517 by Resa Miner, RN)  Patient will be free from injury during hospitalization:   Assess patient's risk for falls and implement fall prevention plan of care per policy   Provide and maintain safe environment   Use appropriate transfer methods   Ensure appropriate safety devices are available at the bedside   Include patient/ family/ care giver in decisions related to safety   Hourly rounding  Goal: Patient will be free from infection during hospitalization  Outcome: Progressing  Flowsheets (Taken 06/05/2019 1501 by Filbert Berthold, RN)  Free from Infection during hospitalization:   Monitor lab/diagnostic results   Assess and monitor for signs and symptoms of infection   Monitor all insertion sites (i.e. indwelling lines, tubes, urinary catheters, and drains)     Problem: Pain  Goal: Pain at adequate level as identified by patient  Outcome: Progressing  Flowsheets (Taken 06/06/2019 1517 by Resa Miner, RN)  Pain at adequate level as identified by patient:   Assess for risk of opioid induced respiratory depression, including snoring/sleep apnea. Alert healthcare team of risk factors identified.   Identify patient comfort function goal     Problem: Altered GI Function  Goal: Elimination patterns are normal or  improving  Outcome: Progressing  Flowsheets (Taken 06/01/2019 0503 by Trevor Iha, RN)  Elimination patterns are normal or improving:   Report abnormal assessment to physician   Anticipate/assist with toileting needs   Assess for normal bowel sounds   Monitor for abdominal distension   Monitor for abdominal discomfort  Goal: Mobility/Activity is maintained at optimal level for patient  Outcome: Progressing  Flowsheets (Taken 05/29/2019 1249 by Priscille Heidelberg, RN)  Mobility/activity is maintained at optimal level for patient:   Increase mobility as tolerated/progressive mobility   Encourage independent activity per ability   Maintain proper body alignment     Problem: Compromised Tissue integrity  Goal: Damaged tissue is healing and protected  Outcome: Progressing  Flowsheets (Taken 06/07/2019 0537)  Damaged tissue is healing and protected:   Monitor/assess Braden scale every shift   Provide wound care per wound care algorithm   Reposition patient every 2 hours and as needed unless able to reposition self   Increase activity as tolerated/progressive mobility   Relieve pressure to bony prominences for patients at moderate and high risk   Avoid shearing injuries   Keep intact skin clean and dry   Use bath wipes, not soap and water, for daily bathing   Use incontinence wipes for cleaning urine, stool and caustic drainage. Foley care as needed   Monitor external devices/tubes for correct placement to prevent pressure, friction and shearing   Encourage use of lotion/moisturizer on skin     Problem: Renal Instability  Goal: Fluid and electrolyte balance are achieved/maintained  Outcome: Progressing  Flowsheets (Taken 06/06/2019 1517 by  Manreal, Achille Rich, RN)  Fluid and electrolyte balance are achieved/maintained:   Monitor intake and output every shift   Provide adequate hydration   Monitor daily weight   Monitor/assess lab values and report abnormal values   Assess for confusion/personality changes

## 2019-06-07 NOTE — Plan of Care (Signed)
Pt alert and oriented. Afebrile throughout shift. Dialysis today, permacath dressing intact. Pt ambulates in room with stand-by assist and a walker. BM today. Desitin applied as ordered. Appetite good. No nausea or vomiting reported. PD removal site intact with staples. No drainage witnessed. Plan for upcoming discharge, unknown if home/rehab.    Problem: Moderate/High Fall Risk Score >5  Goal: Patient will remain free of falls  Outcome: Progressing  Flowsheets (Taken 06/07/2019 1014)  High (Greater than 13):   HIGH-Bed alarm on at all times while patient in bed   HIGH-Apply yellow "Fall Risk" arm band     Problem: Safety  Goal: Patient will be free from injury during hospitalization  Outcome: Progressing  Flowsheets (Taken 06/07/2019 1557)  Patient will be free from injury during hospitalization:   Assess patient's risk for falls and implement fall prevention plan of care per policy   Use appropriate transfer methods   Include patient/ family/ care giver in decisions related to safety   Ensure appropriate safety devices are available at the bedside  Goal: Patient will be free from infection during hospitalization  Outcome: Progressing  Flowsheets (Taken 06/07/2019 1557)  Free from Infection during hospitalization:   Assess and monitor for signs and symptoms of infection   Monitor all insertion sites (i.e. indwelling lines, tubes, urinary catheters, and drains)   Monitor lab/diagnostic results     Problem: Pain  Goal: Pain at adequate level as identified by patient  Outcome: Progressing  Flowsheets (Taken 06/07/2019 1557)  Pain at adequate level as identified by patient:   Identify patient comfort function goal   Assess for risk of opioid induced respiratory depression, including snoring/sleep apnea. Alert healthcare team of risk factors identified.   Assess pain on admission, during daily assessment and/or before any "as needed" intervention(s)   Reassess pain within 30-60 minutes of any procedure/intervention, per  Pain Assessment, Intervention, Reassessment (AIR) Cycle     Problem: Side Effects from Pain Analgesia  Goal: Patient will experience minimal side effects of analgesic therapy  Outcome: Progressing  Flowsheets (Taken 06/07/2019 1557)  Patient will experience minimal side effects of analgesic therapy:   Monitor/assess patient's respiratory status (RR depth, effort, breath sounds)   Prevent/manage side effects per LIP orders (i.e. nausea, vomiting, pruritus, constipation, urinary retention, etc.)   Assess for changes in cognitive function     Problem: Discharge Barriers  Goal: Patient will be discharged home or other facility with appropriate resources  Outcome: Progressing  Flowsheets (Taken 06/07/2019 1557)  Discharge to home or other facility with appropriate resources:   Provide appropriate patient education   Provide information on available health resources   Initiate discharge planning     Problem: Psychosocial and Spiritual Needs  Goal: Demonstrates ability to cope with hospitalization/illness  Outcome: Progressing  Flowsheets (Taken 06/07/2019 1557)  Demonstrates ability to cope with hospitalizations/illness:   Encourage verbalization of feelings/concerns/expectations   Assist patient to identify own strengths and abilities   Encourage participation in diversional activity   Provide quiet environment   Encourage patient to set small goals for self     Problem: Altered GI Function  Goal: Fluid and electrolyte balance are achieved/maintained  Outcome: Progressing  Flowsheets (Taken 06/07/2019 1557)  Fluid and electrolyte balance are achieved/maintained:   Monitor intake and output every shift   Assess for confusion/personality changes   Monitor/assess lab values and report abnormal values   Monitor daily weight   Assess and reassess fluid and electrolyte status   Provide adequate hydration  Goal: Elimination patterns are normal or improving  Outcome: Progressing  Flowsheets (Taken 06/07/2019 1557)  Elimination  patterns are normal or improving:   Anticipate/assist with toileting needs   Assess for normal bowel sounds   Monitor for abdominal distension  Goal: Nutritional intake is adequate  Outcome: Progressing  Flowsheets (Taken 06/07/2019 1557)  Nutritional intake is adequate:   Monitor daily weights   Encourage/perform oral hygiene as appropriate   Assist patient with meals/food selection   Allow adequate time for meals  Goal: Mobility/Activity is maintained at optimal level for patient  Outcome: Progressing  Goal: No bleeding  Outcome: Progressing     Problem: Compromised Tissue integrity  Goal: Damaged tissue is healing and protected  Outcome: Progressing  Flowsheets (Taken 06/07/2019 1557)  Damaged tissue is healing and protected:   Monitor/assess Braden scale every shift   Reposition patient every 2 hours and as needed unless able to reposition self   Provide wound care per wound care algorithm   Relieve pressure to bony prominences for patients at moderate and high risk   Use bath wipes, not soap and water, for daily bathing  Goal: Nutritional status is improving  Outcome: Progressing  Flowsheets (Taken 06/07/2019 1557)  Nutritional status is improving:   Assist patient with eating   Allow adequate time for meals   Encourage patient to take dietary supplement(s) as ordered     Problem: Renal Instability  Goal: Fluid and electrolyte balance are achieved/maintained  Outcome: Progressing  Flowsheets (Taken 06/07/2019 1557)  Fluid and electrolyte balance are achieved/maintained:   Monitor intake and output every shift   Monitor daily weight   Monitor/assess lab values and report abnormal values   Provide adequate hydration   Assess for confusion/personality changes  Goal: Free from infection  Outcome: Progressing  Flowsheets (Taken 06/07/2019 1557)  Free from infection:   Monitor/assess for signs and symptoms of infection   Assess need for indwelling catheter every shift and discuss with LIP

## 2019-06-07 NOTE — Progress Notes (Signed)
Handsoff report received from [place]. RN;  Arrived to unit a/ox3;VSS; no discomfort; Rt. CVC patent w/ good flow.   06/07/19 1300   Bedside Nurse Communication   Name of bedside RN - pre dialysis Carmon Sails   Treatment Initiation- With Dialysis Precautions   Time Out/Safety Check Completed Yes   Consent for HD signed for this hospitalization Y   Blood Consent Verified N/A   Dialysis Precautions All Connections Secured;Saline Line Double Clamped   Dialysis Treatment Type Routine   Is patient diabetic? Yes   RO/Hemodialysis Architectural technologist   Is Total Chlorine less than 0.1 ppm? Yes   Orignial Total Chlorine Testing Time 1250   At 4 Hour Total Chlorine Testing Time 1650   RO/Hemodialysis Facilities manager Number [id number]   RO # [id number]   Water Hardness 0   pH 7.2   Pressure Test Verified Yes   Alarms Verified Passed   Machine Temperature 97.7 F (36.5 C)   Alarms Verified Yes   Na+ mEq (Machine) 138 mEq   Bicarb mEq (Machine) 35 mEq   Hemodialysis Conductivity (Machine) 13.6   Hemodialysis Conductivity (Meter) 14   Dialyzer Lot Number [id number]   Tubing Lot Number [id number]   RO Machine Log Completed Yes   Hepatitis Status   HBsAg (Antigen) Result Negative   HBsAg Date Drawn 05/13/19   HBsAg Repeat Draw Due Date 06/10/19   Vitals   Temp 97.9 F (36.6 C)   Heart Rate 62   Resp Rate 18   BP 136/60   SpO2 98 %   O2 Device None (Room air)   Assessment   Mental Status Alert;Oriented   Cardiac (WDL) WDL   Cardiac Regularity Regular   Cardiac Symptoms None   Cardiac Rhythm Normal Sinus Rhythm   Respiratory  WDL   Respiratory Pattern Regular   Bilateral Breath Sounds Clear   R Breath Sounds Clear   L Breath Sounds Clear   Edema  WDL   Generalized Edema None   Facial None   Sacral None   RLE Edema +1   LLE Edema +1   General Skin Color Appropriate for ethnicity   Skin Condition/Temp Warm;Dry   Gastrointestinal (WDL) WDL   Abdomen Inspection Soft;Rounded   GI Symptoms None   Mobility Ambulatory with Assistance    Pain Assessment   Charting Type Assessment   Pain Scale Used Numeric Scale (0-10)   Numeric Pain Scale   Pain Score 0   POSS Score 1   Pain Location Abdomen   Hemodialysis Comments   Pre-Hemodialysis Comments Timeout safety check- done   Hemodialysis History Information   Outpatient Nephrologist Dr. Randel Pigg

## 2019-06-07 NOTE — Plan of Care (Signed)
Problem: Renal Instability  Goal: Fluid and electrolyte balance are achieved/maintained  Flowsheets (Taken 06/07/2019 1736)  Fluid and electrolyte balance are achieved/maintained:   Monitor/assess lab values and report abnormal values   Assess and reassess fluid and electrolyte status   Monitor daily weight   Follow fluid restrictions/IV/PO parameters  Goal: Free from infection  Flowsheets (Taken 06/07/2019 1736)  Free from infection: Monitor/assess for signs and symptoms of infection     Problem: Patient Receiving Advanced Renal Therapies  Goal: Therapy access site remains intact  Flowsheets (Taken 06/07/2019 1736)  Therapy access site remains intact:   Assess therapy access site   Change therapy access site dressing as needed

## 2019-06-07 NOTE — Discharge Summary (Addendum)
Patient adamant about not surrendering any personal funds to the SNF. She says that she would rather go home. Will discuss with CM to make arrangements for discharge home with home health and outpatient HD tomorrow.     [facility]      Patient: [name]  Admission Date: 05/27/2019   DOB: Jul 29, 1955  Discharge Date: 06/04/2019   MRN: [id number]  Discharge Attending:Devinn Hurwitz     Referring Physician: Pcp, None, MD  PCP: Pcp, None, MD       DISCHARGE SUMMARY     Discharge Information   Admission Diagnosis:   Generalized abdominal pain    Discharge Diagnosis:   Active Hospital Problems    Diagnosis    Bowel incontinence    Thrombocytopenia    Hypotension    Type 2 diabetes mellitus, with long-term current use of insulin    GERD (gastroesophageal reflux disease)    Generalized abdominal pain    ESRD (end stage renal disease) on dialysis    Peritonitis    Peritoneal dialysis catheter dysfunction, initial encounter        Admission Condition: abdominal pain   Discharge Condition: Stable  Consultants: Infectious disease, surgery, nephrology  Functional Status: Ambulating with walker  Discharged to: SNF  Procedures: PD catheter removal  Surgeries: PD catheter removal    Imaging:     CT Abd/Pelvis without Contrast    Result Date: 05/12/2019   INTRAPERITONEAL FLUID/ASCITES (? RELATED TO THE PERITONEAL DIALYSIS) Diane Downing, MD  05/12/2019 9:42 PM    CT Angiogram Chest    Result Date: 05/18/2019     No evidence of pulmonary embolism. Small bilateral pleural effusions. Large hiatal hernia. Trace perihepatic ascites. Other nonurgent findings as above. Diane Skinner, MD  05/18/2019 9:58 AM    CT Abd/Pelvis with IV Contrast only    Result Date: 05/27/2019  1. Fluid-filled mildly distended loops of small bowel with distal decompressed small bowel loops. Findings may represent early or partial small bowel obstruction. Enteritis is felt to be less likely. 2. Trace bilateral pleural effusions. 3. Additional chronic  findings as above. Diane Meigs, MD  05/27/2019 6:07 AM    Tunneled Cath Placement (Permcath)    Result Date: 05/23/2019   Technically successful placement of an  23cm Equistream catheter via the right internal jugular vein as described above. Phlebitic changes in the right internal jugular vein from prior catheter placement. Dimitrios Papadouris, MD  05/23/2019 3:51 PM     Echo Results     None        Discharge Medications:     Medication List      START taking these medications    lactobacillus/streptococcus Caps  Take 1 capsule by mouth daily     levoFLOXacin 250 MG tablet  Commonly known as: LEVAQUIN  Take 1 tablet (250 mg total) by mouth every other day for 6 days     neomycin-bacitracin-polymyxin ointment  Commonly known as: NEOSPORIN  Apply topically 2 (two) times daily for 7 days     oxyCODONE 5 MG immediate release tablet  Commonly known as: ROXICODONE  Take 1 tablet (5 mg total) by mouth every 6 (six) hours as needed for Pain     vancomycin HCl 250 MG/5ML oral solution  Commonly known as: FIRVANQ  Take 2.5 mLs (125 mg total) by mouth every 6 (six) hours for 7 days     zinc Oxide 40 % Pste paste  Commonly known as: DESITIN  Apply topically every 6 (  six) hours        CHANGE how you take these medications    carvedilol 25 MG tablet  Commonly known as: COREG  Take 1 tablet (25 mg total) by mouth every 12 (twelve) hours  What changed: when to take this        CONTINUE taking these medications    amLODIPine 5 MG tablet  Commonly known as: NORVASC  Take 1 tablet (5 mg total) by mouth every 12 (twelve) hours     atorvastatin 40 MG tablet  Commonly known as: LIPITOR     famotidine 40 MG tablet  Commonly known as: PEPCID     gabapentin 600 MG tablet  Commonly known as: NEURONTIN     insulin aspart 100 UNIT/ML injection  Commonly known as: NovoLOG     insulin glargine 100 UNIT/ML injection  Commonly known as: LANTUS  Inject 10 Units into the skin every morning     pantoprazole 40 MG tablet  Commonly known as: PROTONIX  Take  1 tablet (40 mg total) by mouth 2 (two) times daily     sevelamer 800 MG tablet  Commonly known as: RENVELA     Trulicity 1.85 UD/1.4HF Sopn  Generic drug: Dulaglutide  Notes to patient: Resume as outpatient schedule        STOP taking these medications    furosemide 40 MG tablet  Commonly known as: LASIX     oxyCODONE-acetaminophen 5-325 MG per tablet  Commonly known as: PERCOCET     Tresiba 100 UNIT/ML Soln  Generic drug: Insulin Degludec           Where to Get Your Medications      Information about where to get these medications is not yet available    Ask your nurse or doctor about these medications   carvedilol 25 MG tablet   lactobacillus/streptococcus Caps   levoFLOXacin 250 MG tablet   neomycin-bacitracin-polymyxin ointment   oxyCODONE 5 MG immediate release tablet   vancomycin HCl 250 MG/5ML oral solution   zinc Oxide 40 % Pste paste             Hospital Course   Presentation History   Per H&P "[name] is a 64 y.o. female with a PMHx of  hypertension, hyperlipidemia, insulin-dependent diabetes mellitus, COPD, end-stage renal disease on peritoneal dialysis   Recently switched to hemodialysis and discharged yesterday return to the ED with recurrent abdominal pain associated with Non bloody diarrhea, nausea vomiting of nonbilious and nonbloody vomitus.  Patient states that she continued to have loose watery stools on arriving at home yesterday followed by nausea vomiting 2-3 times, described abdominal pain as left-sided pain intensity greater than 10, pain is worse with palpation.  She denies fever/chills, dysuria, chest pain, shortness of breath, blood in stool or vomitus.  She had dialysis yesterday prior to discharge and have not arranged transportation for outpatient dialysis yet.    In the emergency room, CT abdomen pelvis showed partial small bowel obstruction, trace bilateral pleural effusions, revealed WBC of 15.22, H&H of 10.4/34.1, stool studies pending, infectious disease Dr.  Lahoma Crocker and Surgery Dr. Signa Kell consulted".    Hospital Course (9 Days):      Patient was admitted for PD peritonitis.  Peritoneal fluid grew Pseudomonas.  ID recommended IV Zosyn which was deescalated to Cipro based on susceptibility.  ID recommends continuing antibiotics till 5/19.    Patient was also treated prophylactically for C. difficile.  P.o. vancomycin was used.  ID  recommend continue p.o. vancomycin till 5/19. Patient's PD catheter was removed and she was started on hemodialysis.  Outpatient hemodialysis was set up prior to discharge. PT assessed patient and recommended SNF.      Patient Active Hospital Problem List:   Generalized abdominal pain (05/27/2019)   Peritoneal dialysis catheter dysfunction, initial encounter (05/12/2019)   Peritonitis (05/24/2019)    Assessment: PD catheter removed by general surgery with exploratory laparotomy on 05/28/2019. Pleural fluid growing Pseudomonas and E. Coli.  ID has been following.    Plan: Appreciate ID inputs  Oral vancomycin 125 mg every 6 hours has been continued on account of loose stools  incentive spirometry use encouraged   Per ID Cipro PO and vancomycin PO to continue until 5/19.  Will request general surgery to evaluate surgical site for erythema  Additional pain regimen as below.     Bowel incontinence   Assessment-unclear if this is a complication of prior back injury; however this problem is chronic and has been going on for the past year  Plan-minimize risk for bedsores with frequent turning and quick response to any accidents     ESRD (end stage renal disease) on dialysis (05/24/2019)    Assessment: Patient was on PD prior to admission; she has been switched to hemodialysis for now; outpatient HD has been arranged for    Plan: Appreciate nephrology inputs    Outpatient dialysis to continue on a Tuesday Thursday Saturday schedule     Thrombocytopenia (05/31/2019)    Assessment: Could be due to the acute infection as above; platelet counts are stable    Plan:  Okay to continue heparin for DVT prophylaxis  Monitor daily CBC     Hypotension (05/31/2019)    Assessment: Likely due to the infection above    Plan: Antibiotics as above     Type 2 diabetes mellitus, with long-term current use of insulin (05/31/2019)    Assessment: Chronic and unknown control    Plan: Continue with insulin as above     GERD (gastroesophageal reflux disease) (05/31/2019)    Assessment: Chronic and unknown control    Plan: c/w home dose of protonix   started maalox     RECOMMENDATIONS:    - Patient was informed of abnormal and incidental imaging findings during hospitalization, and advised to review this information with their  medical provider.       Best Practices   Was the patient admitted with either a CHF Exacerbation or Pneumonia? NO     Progress Note/Physical Exam at Discharge     Subjective: Patient reported feeling well, and is ready for discharge.    Vitals:    06/07/19 1630 06/07/19 1645 06/07/19 1657 06/07/19 1705   BP: 137/71 137/69 130/63    Pulse: 62 63 61 64   Resp: 15 20 18 18    Temp:   98 F (36.7 C) 98 F (36.7 C)   TempSrc:       SpO2: 98% 100% 100% 100%   Weight:       Height:           Constitutional: She is oriented to person, place, and time. She appears well-developed. No distress.   HENT:    Head: Normocephalic and atraumatic.   Eyes: EOM are normal. Pupils are equal, round, and reactive to light.   Neck: Normal range of motion. Neck supple. No tracheal deviation present. No thyromegaly present.   Cardiovascular: Normal rate, regular rhythm, normal heart sounds and intact distal pulses.  No murmur heard.  Pulmonary/Chest: Effort normal and breath sounds normal. No respiratory distress.   Abdominal: Soft. Bowel sounds are normal. Surgical site in the LUQ with mild erythema and tenderness with staples still in place.   Musculoskeletal: Normal range of motion.   Neurological: She is alert and oriented to person, place, and time.   Skin: Skin is warm and dry. She is not  diaphoretic.   Psychiatric: She has a normal mood and affect. Her behavior is normal. Judgment and thought content normal.          Diagnostics     Labs/Studies Pending at Discharge:    [place]   Lab 06/06/19  0332 06/04/19  0536 06/03/19  0954   WBC 9.03 9.82* 8.18   RBC 2.86* 2.67* 2.90*   Hgb 8.1* 7.6* 8.3*   Hematocrit 28.3* 26.5* 28.6*   MCV 99.0* 99.3* 98.6*   Platelets 167 170 204       Recent Labs   Lab 06/06/19  0332 06/04/19  0536 06/02/19  0345   Sodium 137 137 137   Potassium 4.2 4.2 4.8   Chloride 100 102 103   CO2 27 24 23    BUN 22.0* 23.0* 28.0*   Creatinine 4.4* 5.1* 5.8*   Glucose 255* 263* 174*   Calcium 7.7* 7.3* 7.0*       Microbiology Results     Procedure Component Value Units Date/Time    Anaerobic culture [[id number]] Collected: 05/27/19 2343    Specimen: Other from Wound Updated: 06/02/19 0822    Narrative:      ORDER#: [id number]                                    ORDERED BY: Kalman Jewels  SOURCE: Wound abdominal wound                        COLLECTED:  05/27/19 23:43  ANTIBIOTICS AT COLL.:                                RECEIVED :  05/28/19 07:56  Culture, Anaerobic Bacteria                FINAL       06/02/19 08:22  06/02/19   No anaerobic growth      Anaerobic culture [[id number]] Collected: 05/27/19 2343    Specimen: Other from Drainage Updated: 06/02/19 0600    Narrative:      blue swab  ORDER#: [id number]                                    ORDERED BY: Kalman Jewels  SOURCE: Drainage peritoneal fluid                    COLLECTED:  05/27/19 23:43  ANTIBIOTICS AT COLL.:                                RECEIVED :  05/28/19 07:56  ORDER ENTRY COMMENTS:  blue swab  Culture, Anaerobic Bacteria                FINAL  06/02/19 08:22  06/02/19   No anaerobic growth      Blood Culture Aerobic/Anaerobic #1 [[id number]] Collected: 05/27/19 0507    Specimen: Arm from Blood, Venipuncture Updated: 06/01/19 1221    Narrative:      ORDER#: [id number]                                     ORDERED BY: CHUNG, SORA  SOURCE: Blood, Venipuncture arm                      COLLECTED:  05/27/19 05:07  ANTIBIOTICS AT COLL.:                                RECEIVED :  05/27/19 09:50  Culture Blood Aerobic and Anaerobic        FINAL       06/01/19 12:21  06/01/19   No growth after 5 days of incubation.      Blood Culture Aerobic/Anaerobic #2 [[id number]] Collected: 05/27/19 0507    Specimen: Arm from Blood, Venipuncture Updated: 06/01/19 1221    Narrative:      ORDER#: [id number]                                    ORDERED BY: CHUNG, SORA  SOURCE: Blood, Venipuncture arm                      COLLECTED:  05/27/19 05:07  ANTIBIOTICS AT COLL.:                                RECEIVED :  05/27/19 09:50  Culture Blood Aerobic and Anaerobic        FINAL       06/01/19 12:21  06/01/19   No growth after 5 days of incubation.      COVID-19 (SARS-COV-2) [employer] [facility]) [[id number]] Collected: 05/27/19 0624    Specimen: Nasopharyngeal Swab from Nasopharynx Updated: 05/27/19 0656     Purpose of COVID testing Screening     SARS-CoV-2 Specimen Source Nasopharyngeal     SARS CoV 2 Overall Result Negative     Comment: Test performed using the Abbott ID NOW EUA assay.  Please see Fact Sheets for patients and providers located at:  http://olson-hall.info/  This test is for the qualitative detection of SARS-CoV-2  (COVID19) nucleic acid. Viral nucleic acids may persist in vivo,  independent of viability. Detection of viral nucleic acid does  not imply the presence of infectious virus, or that virus  nucleic acid is the cause of clinical symptoms. Negative  results should be treated as presumptive and, if inconsistent  with clinical signs and symptoms or necessary for patient  management, should be tested with an alternative molecular  assay. Negative results do not preclude SARS-CoV-2 infection  and should not be used as the sole basis for patient  management decisions. Invalid results may be due to inhibiting  substances  in the specimen and recollection should occur.         Narrative:      o Collect and clearly label specimen type:  o Upper respiratory specimen: One Nasopharyngeal Dry Swab NO  Transport Media.  o  Hand deliver to laboratory ASAP  Indication for testing->Extended care facility admission to  semi private room    Culture + Gram Lenise Arena, Body Fluid [[id number]] Collected: 05/27/19 0534    Specimen: Body Fluid from Paracentesis Fluid Updated: 06/01/19 1601    Narrative:      [id number] called Micro Results of Gram Stain. Results read back by: [id number], by  [id number] on 05/27/2019 at 14:31  < 70ml  ORDER#: [id number]                                    ORDERED BY: CHUNG, SORA  SOURCE: Paracentesis Fluid peritoneal dialysis cathetCOLLECTED:  05/27/19 05:34  ANTIBIOTICS AT COLL.:                                RECEIVED :  05/27/19 10:09  ORDER ENTRY COMMENTS:  < 22ml  [id number] called Micro Results of Gram Stain. Results read back by: [id number], by [id number] on 05/27/2019 at 14:31  Stain, Gram                                FINAL       05/27/19 14:31  05/27/19   No Squamous epithelial cells seen             No WBCs seen             Rare Gram positive cocci             Stain performed on Cytospin (concentrated) specimen  Culture and Gram Stain, Aerobic, Body FluidFINAL       06/01/19 16:01  05/30/19   The volume of sample received with this order is less than             the recommended volume. Submission of low volumes may             adversely affect recovery of pathogens and culture results             may be compromised.  06/01/19   ** No growth             ** Note:             ** Gram positive cocci seen in Gram Stain did not grow under             ** aerobic and anaerobic culture conditions, possible nonviable.      Fungus culture [[id number]] Collected: 05/27/19 2343    Specimen: Other from Wound Updated: 06/04/19 1529    Narrative:      ORDER#: [id number]                                    ORDERED BY: Kalman Jewels  SOURCE: Wound  abdominal wound                        COLLECTED:  05/27/19 23:43  ANTIBIOTICS AT COLL.:                                RECEIVED :  05/28/19 07:56  Stain, Fungal  FINAL       05/29/19 12:20  05/29/19   No Fungal or Yeast Elements Seen  Culture Fungus                             PRELIM      06/04/19 15:29  06/04/19   No growth after 1 week/s of incubation.      Fungus culture [[id number]] Collected: 05/27/19 0259    Specimen: Other from Drainage Updated: 06/04/19 1529    Narrative:      blue swab  ORDER#: [id number]                                    ORDERED BY: Kalman Jewels  SOURCE: Drainage peritoneal fluid                    COLLECTED:  05/27/19 02:59  ANTIBIOTICS AT COLL.:                                RECEIVED :  05/28/19 07:56  ORDER ENTRY COMMENTS:  blue swab  Stain, Fungal                              FINAL       05/29/19 12:20  05/29/19   No Fungal or Yeast Elements Seen  Culture Fungus                             PRELIM      06/04/19 15:29  06/04/19   No growth after 1 week/s of incubation.      Wound culture & gram stain [[id number]] Collected: 05/27/19 2343    Specimen: Wound Updated: 05/30/19 0829    Narrative:      blue swab  ORDER#: [id number]                                    ORDERED BY: Kalman Jewels  SOURCE: Wound peritoneal fluid                       COLLECTED:  05/27/19 23:43  ANTIBIOTICS AT COLL.:                                RECEIVED :  05/28/19 07:56  ORDER ENTRY COMMENTS:  blue swab  Stain, Gram                                FINAL       05/28/19 13:06  05/28/19   Few WBCs             Rare Squamous epithelial cells             No organisms seen  Culture and Gram Stain, Aerobic, Wound     FINAL       05/30/19 08:29   +  05/29/19   Light growth of Pseudomonas aeruginosa               Refer to  susceptibilities on culture #[id number]        Wound culture & gram stain [[id number]] Collected: 05/27/19 0221    Specimen: Wound Updated: 05/30/19 0828    Narrative:       ORDER#: [id number]                                    ORDERED BY: Kalman Jewels  SOURCE: Wound abdominal wound                        COLLECTED:  05/27/19 02:21  ANTIBIOTICS AT COLL.:                                RECEIVED :  05/28/19 07:56  Stain, Gram                                FINAL       05/28/19 13:03  05/28/19   Moderate WBCs             Few Squamous epithelial cells             No organisms seen  Culture and Gram Stain, Aerobic, Wound     FINAL       05/30/19 08:28   +  05/30/19   Light growth of Pseudomonas aeruginosa      _____________________________________________________________________________                                  P.aeruginosa    ANTIBIOTICS                     MIC  INTRP      _____________________________________________________________________________  Amikacin                        <=8    S        Aztreonam                        8     S        Cefepime                         2     S        Ceftazidime                     <=2    S        Ciprofloxacin                 <=0.25   S        Gentamicin                      <=2    S        Levofloxacin                   <=0.5   S        Meropenem                      <=0.5   S  Piperacillin/Tazobactam         4/4    S        Tobramycin                      <=2    S        _____________________________________________________________________________            S=SUSCEPTIBLE     I=INTERMEDIATE     R=RESISTANT                            N/S=NON-SUSCEPTIBLE  _____________________________________________________________________________             Patient Instructions   Discharge Diet: Heart healthy  Discharge Activity: As tolerated  LABS/TESTING recommended after discharge    Follow Up Appointment:  Follow-up Information     Herbert Moors., MD. Schedule an appointment as soon as possible for a visit in 10 day(s).    Specialty: Surgery  Why: for staple removal  Contact information:  [address]  [place]  [place] [address]  [phone]             Ulyses Amor, MD. Schedule an appointment as soon as possible for a visit in 1 week(s).    Specialties: Nephrology, Internal Medicine  Contact information:  [address]  [address] [address]  [phone]             Ernst Breach, MD. Schedule an appointment as soon as possible for a visit.    Specialties: Infectious Disease, Internal Medicine  Contact information:  [place] [address]  [phone]                    Time spent examining patient, discussing with patient/family regarding hospital course, chart review, reconciling medications and discharge planning: > 35 minutes.  This patient was examined by me on , the day of discharge.    Signed,  Murlean Hark    8:16 PM 06/07/2019

## 2019-06-07 NOTE — Progress Notes (Signed)
[facility] PROGRESS NOTE  Aaron Edelman, x [phone] ([place])      Date Time: 06/07/19 7:44 AM  Patient Name: [name]  Attending Physician: Murlean Hark, MD    CC: follow-up ESRD    Assessment:     1. ESRD- previously on PD; switched to HD on  5/3   2. PD associated peritonitis - growing Pseudomonas and E. Coli, antibiotics change to zosyn --> po levoquin  - S/p PD catheter removal on 05/27/19  3. 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 Secondary hyperparathyroidism     Recommendations:   1. HD today  2. Stable from renal standpoint, will follow at [facility] rehab        Case discussed with: pt and HD nurse    Corky Mull, MD  [facility]  [phone] (office)  [phone] [facility])    Subjective:  Mild abd pain  No SOB    Review of Systems:   Cardio:no chest pain  Pulm:no SOB  GN:[id number] at incision site    Physical Exam:     Vitals:    06/06/19 2221 06/07/19 0003 06/07/19 0205 06/07/19 0509   BP: 163/78 131/76 123/73 119/73   Pulse: 74 65 64 62   Resp:  17  18   Temp:  98.6 F (37 C)  97.9 F (36.6 C)   TempSrc:  Oral  Oral   SpO2:  97%  99%   Weight:    111.2 kg (245 lb 2.4 oz)   Height:           Intake and Output Summary (Last 24 hours) at Date Time    Intake/Output Summary (Last 24 hours) at 06/07/2019 0744  Last data filed at 06/07/2019 0547  Gross per 24 hour   Intake 1460 ml   Output 0 ml   Net 1460 ml       General: awake, alert, oriented x 3, no acute distress.  HEENT: sclera anicteric  Neck: supple  Extremities: improved LE edema  Neuro: A+O x 3, no gross motor/sensory deficits    Access R HDTC    Meds:      Scheduled Meds: PRN Meds:    acetaminophen, 1,000 mg, Oral, 4 times per day  amLODIPine, 5 mg, Oral, Q12H  atorvastatin, 40 mg, Oral, Daily  carvedilol, 25 mg, Oral, Q12H SCH  darbepoetin alfa, 60 mcg, Subcutaneous, Weekly  docusate sodium, 100 mg, Oral, Daily  gabapentin, 300 mg, Oral, Daily  heparin (porcine), 5,000 Units,  Subcutaneous, Q12H SCH  insulin glargine, 5 Units, Subcutaneous, Q12H  insulin lispro, 1-8 Units, Subcutaneous, TID AC  lactobacillus/streptococcus, 1 capsule, Oral, Daily  levoFLOXacin, 250 mg, Oral, Q48H  pantoprazole, 40 mg, Oral, QAM AC  sevelamer, 800 mg, Oral, TID MEALS  vancomycin, 125 mg, Oral, Q6H  zinc Oxide, , Topical, Q6H          Continuous Infusions:   sodium chloride 20 mL/hr at 05/27/19 1100    sodium chloride 15 mL/hr at 06/02/19 2057    lactated ringers      sodium chloride, , PRN  bisacodyl, 10 mg, Daily PRN  dextrose, 15 g of glucose, PRN   And  dextrose, 12.5 g, PRN   And  glucagon (rDNA), 1 mg, PRN  dextrose, 15 g of glucose, PRN   And  dextrose, 12.5 g, PRN   And  glucagon (rDNA), 1 mg, PRN  diphenhydrAMINE, 25 mg, Q6H PRN  guaiFENesin-dextromethorphan, 5 mL, Q4H  PRN  naloxone, 0.2 mg, PRN  ondansetron, 4 mg, Once PRN  ondansetron, 4 mg, Q6H PRN   Or  ondansetron, 4 mg, Q6H PRN  oxyCODONE, 5 mg, Q6H PRN   Or  oxyCODONE, 10 mg, Q6H PRN  petrolatum, , PRN              Labs:     Recent Labs   Lab 06/06/19  0332 06/04/19  0536 06/03/19  0954   WBC 9.03 9.82* 8.18   Hgb 8.1* 7.6* 8.3*   Hematocrit 28.3* 26.5* 28.6*   Platelets 167 170 204     Recent Labs   Lab 06/06/19  0332 06/04/19  0536 06/03/19  0954 06/02/19  0345   Sodium 137 137  --  137   Potassium 4.2 4.2  --  4.8   Chloride 100 102  --  103   CO2 27 24  --  23   BUN 22.0* 23.0*  --  28.0*   Creatinine 4.4* 5.1*  --  5.8*   Calcium 7.7* 7.3*  --  7.0*   Phosphorus  --   --  3.8  --    Glucose 255* 263*  --  174*   EGFR 12.2 10.3  --  8.9         Signed by: Corky Mull, MD

## 2019-06-07 NOTE — Progress Notes (Signed)
5/15 [place] Delay patient has discussed not wanting to trade financial compensation at [employer] from social security. DCP follow up with patient regarding medicaid aide waiver; [place] DMAS site down; Pt at HD transport PTS  Crist Infante, RN, BSN, M.S.H.S., CCRN  Case Manager [place]  [phone]  Fax [phone] or [phone]

## 2019-06-07 NOTE — Progress Notes (Signed)
Medicine Nursing Note    Date Time: 06/07/19 5:39 AM  Patient Name: [name]  Attending Physician: Murlean Hark, MD    Assessment     Shift event: Pain Management   Neuro: A&O x 4, Calm and Cooperative    Tele/CV/CP No Tele, VSS, RA, Afebrile   Foley/GU N/A Pt voiding    BMs/GI Last BM 06/06/2019, on Consistent Carbohydrate and Renal Protein Restriction 80gm diet   Skin: Intact, Surgical Incision Left Abdomen   Isolation: N/A   Pain: Pt c/o pain in Abdomen 7/10 or 8/10   MEWS: 1   Central Line: Permacath Right Upper Chest for Hemodialysis, Dressing Change Due 06/10/2019   Safety:  Placed Moderate fall risk. Safety maintained, purposeful rounding performed, Bed Alarm activated, Call bell within reach.    Interpreter: Pt speaks English and denies interpreter needs.   Comments:  Pt c/o pain and PRN medication given.   Medications given per order.   IV site flushed and Saline-Locked. Needs to be changed     Provided comfort including repositioning, pain medication and cold pack   Oral fluids offered. Hygiene supply provided.     No c/o Chest Pain  No c/o Cough and SOB.  No acute distress noted with breathing.  Denied Nausea and Vomiting.       The whiteboard was updated with current team information.   The belongings are at bedside.   Discussed and explained the care plan for the night.   Explained and educated Pt on safety precaution.

## 2019-06-08 LAB — GFR: EGFR: 13.2

## 2019-06-08 LAB — BASIC METABOLIC PANEL
Anion Gap: 12 (ref 5.0–15.0)
BUN: 24 mg/dL — ABNORMAL HIGH (ref 7.0–19.0)
CO2: 25 mEq/L (ref 22–29)
Calcium: 7.9 mg/dL — ABNORMAL LOW (ref 8.5–10.5)
Chloride: 99 mEq/L — ABNORMAL LOW (ref 100–111)
Creatinine: 4.1 mg/dL — ABNORMAL HIGH (ref 0.6–1.0)
Glucose: 216 mg/dL — ABNORMAL HIGH (ref 70–100)
Potassium: 4.1 mEq/L (ref 3.5–5.1)
Sodium: 136 mEq/L (ref 136–145)

## 2019-06-08 LAB — CBC
Absolute NRBC: 0 10*3/uL (ref 0.00–0.00)
Hematocrit: 27.6 % — ABNORMAL LOW (ref 34.7–43.7)
Hgb: 8 g/dL — ABNORMAL LOW (ref 11.4–14.8)
MCH: 28.4 pg (ref 25.1–33.5)
MCHC: 29 g/dL — ABNORMAL LOW (ref 31.5–35.8)
MCV: 97.9 fL — ABNORMAL HIGH (ref 78.0–96.0)
MPV: 11.8 fL (ref 8.9–12.5)
Nucleated RBC: 0 /100 WBC (ref 0.0–0.0)
Platelets: 145 10*3/uL (ref 142–346)
RBC: 2.82 10*6/uL — ABNORMAL LOW (ref 3.90–5.10)
RDW: 13 % (ref 11–15)
WBC: 7.77 10*3/uL (ref 3.10–9.50)

## 2019-06-08 LAB — GLUCOSE WHOLE BLOOD - POCT
Whole Blood Glucose POCT: 212 mg/dL — ABNORMAL HIGH (ref 70–100)
Whole Blood Glucose POCT: 227 mg/dL — ABNORMAL HIGH (ref 70–100)
Whole Blood Glucose POCT: 221 mg/dL — ABNORMAL HIGH (ref 70–100)
Whole Blood Glucose POCT: 264 mg/dL — ABNORMAL HIGH (ref 70–100)

## 2019-06-08 LAB — HEMOLYSIS INDEX: Hemolysis Index: -1 — ABNORMAL LOW (ref 0–18)

## 2019-06-08 MED ORDER — PIPERACILLIN-TAZOBACTAM 2.25 GM MBP (CNR)
2.25 g | Freq: Four times a day (QID) | INTRAVENOUS | Status: DC
Start: 2019-06-08 — End: 2019-06-09
  Administered 2019-06-08 – 2019-06-09 (×3): 2250 mg via INTRAVENOUS
  Filled 2019-06-08 (×4): qty 2250

## 2019-06-08 NOTE — Plan of Care (Signed)
Discharge Recommendation: SNF(if home, will need HHPT/OT and supervision)  DME Recommended for Discharge: Front wheel walker(Patient states that her friend just found her a rollator)    Is an Occupational Therapy Evaluation Indicated at this time? This patient is already on OT caseload.     Treatment/Interventions: Exercise, Gait training, LE strengthening/ROM, Endurance training, Bed mobility, Continued evaluation  PT Frequency: 2-3x/wk     PMP Activity: Step 7 - Walks out of Room  Distance Walked (ft) (Step 6,7): 90 Feet  (Please See Therapy Evaluation for device and assistance level needed)    Goals:   Goals  Goal Formulation: With patient  Time for Goal Acheivement: By time of discharge  Pt Will Go Supine To Sit: modified independent, to maximize functional mobility and independence, Partly met  Pt Will Perform Sit To Supine: modified independent, to maximize functional mobility and independence, Partly met  Pt Will Perform Sit to Stand: with supervision, to maximize functional mobility and independence, Partly met  Pt Will Transfer Bed/Chair: with rolling walker, with supervision, to maximize functional mobility and independence, Partly met  Pt Will Ambulate: 51-100 feet, with rolling walker, with stand by assist, to maximize functional mobility and independence, Partly met

## 2019-06-08 NOTE — Progress Notes (Signed)
HOSPITALIST PROGRESS NOTE      Patient: [name]  Date: 06/08/2019    LOS: 10 Days  Admission Date: 05/27/2019   MRN: [id number]  Attending: Murlean Hark  MD, MPH                    Please contact via [place]: [phone]                    Pager: [phone]     ASSESSMENT/PLAN     [name] is a 64 y.o. female admitted with Generalized abdominal pain    Interval Summary:     Active Hospital Problems    Diagnosis    Bowel incontinence    Thrombocytopenia    Hypotension    Type 2 diabetes mellitus, with long-term current use of insulin    GERD (gastroesophageal reflux disease)    Generalized abdominal pain    ESRD (end stage renal disease) on dialysis    Peritonitis    Peritoneal dialysis catheter dysfunction, initial encounter       Presentation History   Per H&P "[name] McKethanis a 64 y.o.femalewith a PMHx of hypertension, hyperlipidemia, insulin-dependent diabetes mellitus, COPD, end-stage renal disease on peritoneal dialysis  Recently switched to hemodialysis and discharged yesterdayreturnto the ED with recurrent abdominal pain associated with Non bloodydiarrhea, nausea vomiting of nonbilious and nonbloody vomitus. Patient states that she continued to have loose watery stools on arriving at home yesterday followed by nausea vomiting 2-3 times, described abdominal pain as left-sided pain intensity greater than 10, pain is worse with palpation. She denies fever/chills, dysuria, chest pain, shortness of breath, blood in stool or vomitus. She had dialysis yesterday prior to discharge and have not arranged transportation for outpatient dialysis yet.    In the emergency room, CT abdomen pelvis showed partial small bowel obstruction, trace bilateral pleural effusions, revealed WBC of 15.22, H&H of 10.4/34.1,stool studies pending, infectious disease Dr. Lamona Young Surgery Dr. Myrna Young".    Hospital Course (10 Days):    Patient was admitted  for PD peritonitis.  Peritoneal fluid grew Pseudomonas.  ID recommended IV Zosyn which was deescalated to Cipro based on susceptibility.  ID had recommended continuing antibiotics till 5/19. Given increased abdominal pain zosyn was resumed 5/16.     Patient was also treated prophylactically for C. difficile.  P.o. vancomycin was used.  ID recommend continue p.o. vancomycin till 5/19. Patient's PD catheter was removed and she was started on hemodialysis.  Outpatient hemodialysis was set up prior to discharge. PT assessed patient and recommended SNF but patient now wants to go home with home PT/OT     Patient Active Hospital Problem List:  Generalized abdominal pain (05/27/2019)  Peritoneal dialysis catheter dysfunction, initial encounter (05/12/2019)  Peritonitis (05/24/2019)  Assessment: PD catheter removed by general surgery with exploratory laparotomy on 05/28/2019. Pleural fluid growing Pseudomonas and E. Coli. ID has been following.  Increased abdominal pain in the past 24-48 hours.  No leukocytosis  Plan:   Repeat CT abd/pelvis with IV and PO contrast ordered to r/o intraabdominal vs abdominal wall abscess   Will request general surgery to evaluate surgical site for erythema  Oral vancomycin 125 mg every 6 hours has been continued as C. difficile prophylaxis given broad-spectrum antibiotics  incentive spirometry use encouraged   Additional pain regimen as below.  Bowel incontinence   Assessment-unclear if this is a complication of prior back injury; however this problem is chronic and has been going on for the past year  Plan-minimize risk for bedsores with frequent turning and quick response to any accidents    ESRD (end stage renal disease) on dialysis (05/24/2019)  Assessment: Patient was on PD prior to admission; she has been switched to hemodialysis for now; outpatient HD has been arranged for  Plan: Appreciate nephrology inputs   Outpatient dialysis to continue on a Tuesday Thursday Saturday  schedule    Thrombocytopenia (05/31/2019)  Assessment: Could be due to the acute infection as above; platelet counts are stable  Plan: Okay to continue heparin for DVT prophylaxis  Monitor daily CBC    Hypotension (05/31/2019)  Assessment: Likely due to the infection above  Plan: Antibiotics as above    Type 2 diabetes mellitus, with long-term current use of insulin (05/31/2019)  Assessment: Chronic and unknown control  Plan: Continue with insulin as above   Diabetic diet     GERD (gastroesophageal reflux disease) (05/31/2019)  Assessment: Chronic and unknown control  Plan: c/w home dose of protonix  Continue maalox      Analgesia: Oxycodone as needed and Tylenol scheduled  Continue with gabapentin 300 mg daily    Nutrition: Cardiac and diabetic    GI Prophylaxis: None    DVT Prophylaxis: Heparin    Code Status: Full code    DISPO: Discharge order canceled on account of increasing abdominal pain       SUBJECTIVE     [name] states that her abdominal pain in the incision site is now 10/10.  No relief with Tylenol.  Notes notable improvement with oxycodone.    Febrile- None   Eating- Well   Coughing- None   Diarrhea- None   Sleeping - Well   Voiding - Well        MEDICATIONS     Current Facility-Administered Medications   Medication Dose Route Frequency    acetaminophen  1,000 mg Oral 4 times per day    amLODIPine  5 mg Oral Q12H    atorvastatin  40 mg Oral Daily    carvedilol  25 mg Oral Q12H SCH    darbepoetin alfa  60 mcg Subcutaneous Weekly    docusate sodium  100 mg Oral Daily    gabapentin  300 mg Oral Daily    heparin (porcine)  5,000 Units Subcutaneous Q12H SCH    insulin glargine  5 Units Subcutaneous Q12H    insulin lispro  1-8 Units Subcutaneous TID AC    lactobacillus/streptococcus  1 capsule Oral Daily    neomycin-bacitracin-polymyxin   Topical BID    pantoprazole  40 mg Oral QAM AC    piperacillin-tazobactam  2.25 g Intravenous Q6H    sevelamer  800 mg Oral TID  MEALS    vancomycin  125 mg Oral Q6H    zinc Oxide   Topical Q6H       PHYSICAL EXAM     Vitals:    06/08/19 1654   BP: 115/70   Pulse: 68   Resp: 18   Temp: 97.9 F (36.6 C)   SpO2: 98%       Temperature: Temp  Min: 97.7 F (36.5 C)  Max: 98.6 F (37 C)  Pulse: Pulse  Min: 62  Max: 69  Respiratory: Resp  Min: 18  Max: 18  Non-Invasive BP: BP  Min: 100/64  Max: 137/78  Pulse Oximetry SpO2  Min: 96 %  Max: 98 %    Intake and Output Summary (Last 24 hours) at Date Time    Intake/Output Summary (Last 24 hours) at 06/08/2019 1826  Last data filed at 06/08/2019 1343  Gross per 24 hour   Intake 760 ml   Output 150 ml   Net 610 ml       Physical Exam   Constitutional: She is oriented to person, place, and time and well-developed, well-nourished, and in no distress. No distress.   HENT:   Head: Normocephalic and atraumatic.   Right Ear: External ear normal.   Left Ear: External ear normal.   Mouth/Throat: Oropharynx is clear and moist.   Eyes: Pupils are equal, round, and reactive to light.   Neck: No JVD present. No tracheal deviation present. No thyromegaly present.   Cardiovascular: Normal rate, regular rhythm, normal heart sounds and intact distal pulses. Exam reveals no gallop and no friction rub.   No murmur heard.  Pulmonary/Chest: Effort normal and breath sounds normal. No stridor. No respiratory distress. She has no wheezes. She has no rales. She exhibits no tenderness.   Abdominal: Soft. Bowel sounds are normal. She exhibits no distension and no mass. There is abdominal tenderness (Surgical site with staples in place mild erythema around the site.  No discharge noted.  Exquisitely tender in some areas.). There is no rebound and no guarding.   Musculoskeletal:         General: No tenderness, deformity or edema. Normal range of motion.      Cervical back: Normal range of motion and neck supple.   Lymphadenopathy:     She has no cervical adenopathy.   Neurological: She is alert and oriented to person, place, and  time. She has normal reflexes. No cranial nerve deficit. She exhibits normal muscle tone. Gait normal. Coordination normal. GCS score is 15.   Skin: Skin is warm and dry. No rash noted. She is not diaphoretic. No erythema. No pallor.   Psychiatric: Mood, memory, affect and judgment normal.     LABS     Recent Labs   Lab 06/08/19  0339 06/06/19  0332 06/04/19  0536   WBC 7.77 9.03 9.82*   RBC 2.82* 2.86* 2.67*   Hgb 8.0* 8.1* 7.6*   Hematocrit 27.6* 28.3* 26.5*   MCV 97.9* 99.0* 99.3*   Platelets 145 167 170       Recent Labs   Lab 06/08/19  0339 06/06/19  0332 06/04/19  0536 06/02/19  0345   Sodium 136 137 137 137   Potassium 4.1 4.2 4.2 4.8   Chloride 99* 100 102 103   CO2 25 27 24 23    BUN 24.0* 22.0* 23.0* 28.0*   Creatinine 4.1* 4.4* 5.1* 5.8*   Glucose 216* 255* 263* 174*   Calcium 7.9* 7.7* 7.3* 7.0*                         Microbiology Results     Procedure Component Value Units Date/Time    Anaerobic culture [[id number]] Collected: 05/27/19 2343    Specimen: Other from Wound Updated: 06/02/19 0822    Narrative:      ORDER#: [id number]                                    ORDERED BY: Kalman Jewels  SOURCE: Wound abdominal  wound                        COLLECTED:  05/27/19 23:43  ANTIBIOTICS AT COLL.:                                RECEIVED :  05/28/19 07:56  Culture, Anaerobic Bacteria                FINAL       06/02/19 08:22  06/02/19   No anaerobic growth      Anaerobic culture [[id number]] Collected: 05/27/19 2343    Specimen: Other from Drainage Updated: 06/02/19 0822    Narrative:      blue swab  ORDER#: [id number]                                    ORDERED BY: Kalman Jewels  SOURCE: Drainage peritoneal fluid                    COLLECTED:  05/27/19 23:43  ANTIBIOTICS AT COLL.:                                RECEIVED :  05/28/19 07:56  ORDER ENTRY COMMENTS:  blue swab  Culture, Anaerobic Bacteria                FINAL       06/02/19 08:22  06/02/19   No anaerobic growth      Blood Culture Aerobic/Anaerobic #1  [[id number]] Collected: 05/27/19 0507    Specimen: Arm from Blood, Venipuncture Updated: 06/01/19 1221    Narrative:      ORDER#: [id number]                                    ORDERED BY: CHUNG, SORA  SOURCE: Blood, Venipuncture arm                      COLLECTED:  05/27/19 05:07  ANTIBIOTICS AT COLL.:                                RECEIVED :  05/27/19 09:50  Culture Blood Aerobic and Anaerobic        FINAL       06/01/19 12:21  06/01/19   No growth after 5 days of incubation.      Blood Culture Aerobic/Anaerobic #2 [[id number]] Collected: 05/27/19 0507    Specimen: Arm from Blood, Venipuncture Updated: 06/01/19 1221    Narrative:      ORDER#: [id number]                                    ORDERED BY: CHUNG, SORA  SOURCE: Blood, Venipuncture arm                      COLLECTED:  05/27/19 05:07  ANTIBIOTICS AT COLL.:  RECEIVED :  05/27/19 09:50  Culture Blood Aerobic and Anaerobic        FINAL       06/01/19 12:21  06/01/19   No growth after 5 days of incubation.      COVID-19 (SARS-COV-2) [facility] [employer]) [[id number]] Collected: 05/27/19 0624    Specimen: Nasopharyngeal Swab from Nasopharynx Updated: 05/27/19 0656     Purpose of COVID testing Screening     SARS-CoV-2 Specimen Source Nasopharyngeal     SARS CoV 2 Overall Result Negative     Comment: Test performed using the Abbott ID NOW EUA assay.  Please see Fact Sheets for patients and providers located at:  http://[facility]-hall.info/  This test is for the qualitative detection of SARS-CoV-2  (COVID19) nucleic acid. Viral nucleic acids may persist in vivo,  independent of viability. Detection of viral nucleic acid does  not imply the presence of infectious virus, or that virus  nucleic acid is the cause of clinical symptoms. Negative  results should be treated as presumptive and, if inconsistent  with clinical signs and symptoms or necessary for patient  management, should be tested with an alternative molecular  assay. Negative  results do not preclude SARS-CoV-2 infection  and should not be used as the sole basis for patient  management decisions. Invalid results may be due to inhibiting  substances in the specimen and recollection should occur.         Narrative:      o Collect and clearly label specimen type:  o Upper respiratory specimen: One Nasopharyngeal Dry Swab NO  Transport Media.  o Hand deliver to laboratory ASAP  Indication for testing->Extended care facility admission to  semi private room    Culture + Gram Lenise Arena, Body Fluid [[id number]] Collected: 05/27/19 0534    Specimen: Body Fluid from Paracentesis Fluid Updated: 06/01/19 1601    Narrative:      [id number] called Micro Results of Gram Stain. Results read back by: [id number], by  [id number] on 05/27/2019 at 14:31  < 7ml  ORDER#: [id number]                                    ORDERED BY: CHUNG, SORA  SOURCE: Paracentesis Fluid peritoneal dialysis cathetCOLLECTED:  05/27/19 05:34  ANTIBIOTICS AT COLL.:                                RECEIVED :  05/27/19 10:09  ORDER ENTRY COMMENTS:  < 76ml  [id number] called Micro Results of Gram Stain. Results read back by: [id number], by [id number] on 05/27/2019 at 14:31  Stain, Gram                                FINAL       05/27/19 14:31  05/27/19   No Squamous epithelial cells seen             No WBCs seen             Rare Gram positive cocci             Stain performed on Cytospin (concentrated) specimen  Culture and Gram Stain, Aerobic, Body FluidFINAL       06/01/19 16:01  05/30/19   The volume of sample received with this order is less than  the recommended volume. Submission of low volumes may             adversely affect recovery of pathogens and culture results             may be compromised.  06/01/19   ** No growth             ** Note:             ** Gram positive cocci seen in Gram Stain did not grow under             ** aerobic and anaerobic culture conditions, possible nonviable.      Fungus culture [[id number]] Collected: 05/27/19  2343    Specimen: Other from Wound Updated: 06/04/19 1529    Narrative:      ORDER#: [id number]                                    ORDERED BY: Kalman Jewels  SOURCE: Wound abdominal wound                        COLLECTED:  05/27/19 23:43  ANTIBIOTICS AT COLL.:                                RECEIVED :  05/28/19 07:56  Stain, Fungal                              FINAL       05/29/19 12:20  05/29/19   No Fungal or Yeast Elements Seen  Culture Fungus                             PRELIM      06/04/19 15:29  06/04/19   No growth after 1 week/s of incubation.      Fungus culture [[id number]] Collected: 05/27/19 0259    Specimen: Other from Drainage Updated: 06/04/19 1529    Narrative:      blue swab  ORDER#: [id number]                                    ORDERED BY: Kalman Jewels  SOURCE: Drainage peritoneal fluid                    COLLECTED:  05/27/19 02:59  ANTIBIOTICS AT COLL.:                                RECEIVED :  05/28/19 07:56  ORDER ENTRY COMMENTS:  blue swab  Stain, Fungal                              FINAL       05/29/19 12:20  05/29/19   No Fungal or Yeast Elements Seen  Culture Fungus                             PRELIM      06/04/19 15:29  06/04/19   No growth after 1  week/s of incubation.      Wound culture & gram stain [[id number]] Collected: 05/27/19 2343    Specimen: Wound Updated: 05/30/19 0829    Narrative:      blue swab  ORDER#: [id number]                                    ORDERED BY: Kalman Jewels  SOURCE: Wound peritoneal fluid                       COLLECTED:  05/27/19 23:43  ANTIBIOTICS AT COLL.:                                RECEIVED :  05/28/19 07:56  ORDER ENTRY COMMENTS:  blue swab  Stain, Gram                                FINAL       05/28/19 13:06  05/28/19   Few WBCs             Rare Squamous epithelial cells             No organisms seen  Culture and Gram Stain, Aerobic, Wound     FINAL       05/30/19 08:29   +  05/29/19   Light growth of Pseudomonas aeruginosa               Refer to  susceptibilities on culture #[id number]        Wound culture & gram stain [[id number]] Collected: 05/27/19 0221    Specimen: Wound Updated: 05/30/19 0828    Narrative:      ORDER#: [id number]                                    ORDERED BY: Kalman Jewels  SOURCE: Wound abdominal wound                        COLLECTED:  05/27/19 02:21  ANTIBIOTICS AT COLL.:                                RECEIVED :  05/28/19 07:56  Stain, Gram                                FINAL       05/28/19 13:03  05/28/19   Moderate WBCs             Few Squamous epithelial cells             No organisms seen  Culture and Gram Stain, Aerobic, Wound     FINAL       05/30/19 08:28   +  05/30/19   Light growth of Pseudomonas aeruginosa      _____________________________________________________________________________                                  P.aeruginosa    ANTIBIOTICS  MIC  INTRP      _____________________________________________________________________________  Amikacin                        <=8    S        Aztreonam                        8     S        Cefepime                         2     S        Ceftazidime                     <=2    S        Ciprofloxacin                 <=0.25   S        Gentamicin                      <=2    S        Levofloxacin                   <=0.5   S        Meropenem                      <=0.5   S        Piperacillin/Tazobactam         4/4    S        Tobramycin                      <=2    S        _____________________________________________________________________________            S=SUSCEPTIBLE     I=INTERMEDIATE     R=RESISTANT                            N/S=NON-SUSCEPTIBLE  _____________________________________________________________________________             No results found.    Echo Results     None          I have personally reviewed the patient's labs, medications, and imaging    Total visit time = 45 mins ; more than 50% spent counseling/coordinating care    Signed,    Murlean Hark MD, MPH  6:26 PM 06/08/2019   Please contact via [place]: [phone]  Pager: [phone]       This chart was generated using a medical voice-recognition software which does not employ spell-checking or grammar-checking features. It was dictated, all or in part, in a busy and often noisy patient care environment. I have taken all usual measures to dictate carefully and to review all aspects of this chart. Nonetheless, given the known and well-documented performance characteristics of voice recognition software in such patient care environments, this dictation still may contain unrecognized and wholly unintended errors or omissions.

## 2019-06-08 NOTE — Plan of Care (Signed)
Problem: Moderate/High Fall Risk Score >5  Goal: Patient will remain free of falls  Outcome: Progressing  Flowsheets  Taken 06/07/2019 2300 by Sebastian Ache, RN  Moderate Risk (6-13):   MOD-Remain with patient during toileting   MOD-Consider a move closer to [place]   MOD-Floor mat at bedside (where available) if appropriate   MOD-Apply bed exit alarm if patient is confused   MOD-Consider activation of bed alarm if appropriate  Taken 06/07/2019 1014 by Peri Jefferson, RN  High (Greater than 13):   HIGH-Bed alarm on at all times while patient in bed   HIGH-Apply yellow "Fall Risk" arm band  Taken 06/01/2019 1534 by Jory Sims, RN  [facility] High Risk (Greater than 13):   Use chair-pad alarm device   Use of floor mat   A CHAIR PAD ALARM WILL BE USED WHEN PATIENT IS UP SITTING IN A CHAIR     Problem: Safety  Goal: Patient will be free from injury during hospitalization  Outcome: Progressing  Goal: Patient will be free from infection during hospitalization  Outcome: Progressing  Flowsheets (Taken 06/07/2019 1557 by Peri Jefferson, RN)  Free from Infection during hospitalization:   Assess and monitor for signs and symptoms of infection   Monitor all insertion sites (i.e. indwelling lines, tubes, urinary catheters, and drains)   Monitor lab/diagnostic results     Problem: Pain  Goal: Pain at adequate level as identified by patient  Outcome: Progressing     Problem: Side Effects from Pain Analgesia  Goal: Patient will experience minimal side effects of analgesic therapy  Outcome: Progressing     Problem: Discharge Barriers  Goal: Patient will be discharged home or other facility with appropriate resources  Outcome: Progressing     Problem: Psychosocial and Spiritual Needs  Goal: Demonstrates ability to cope with hospitalization/illness  Outcome: Progressing  Flowsheets (Taken 06/07/2019 1557 by Peri Jefferson, RN)  Demonstrates ability to cope with hospitalizations/illness:   Encourage verbalization of  feelings/concerns/expectations   Assist patient to identify own strengths and abilities   Encourage participation in diversional activity   Provide quiet environment   Encourage patient to set small goals for self     Problem: Altered GI Function  Goal: Fluid and electrolyte balance are achieved/maintained  Outcome: Progressing  Goal: Elimination patterns are normal or improving  Outcome: Progressing  Goal: Nutritional intake is adequate  Outcome: Progressing  Goal: Mobility/Activity is maintained at optimal level for patient  Outcome: Progressing  Goal: No bleeding  Outcome: Progressing  Flowsheets (Taken 06/02/2019 2138 by Jory Sims, RN)  No bleeding: Monitor and assess vitals and hemodynamic parameters     Problem: Compromised Tissue integrity  Goal: Damaged tissue is healing and protected  Outcome: Progressing  Flowsheets (Taken 06/07/2019 1557 by Peri Jefferson, RN)  Damaged tissue is healing and protected:   Monitor/assess Braden scale every shift   Reposition patient every 2 hours and as needed unless able to reposition self   Provide wound care per wound care algorithm   Relieve pressure to bony prominences for patients at moderate and high risk   Use bath wipes, not soap and water, for daily bathing  Goal: Nutritional status is improving  Outcome: Progressing     Problem: Renal Instability  Goal: Fluid and electrolyte balance are achieved/maintained  Outcome: Progressing  Goal: Free from infection  Outcome: Progressing  Flowsheets (Taken 06/07/2019 1736 by Mickel Baas, RN)  Free from infection: Monitor/assess for signs and symptoms of  infection     Problem: Patient Receiving Advanced Renal Therapies  Goal: Therapy access site remains intact  Outcome: Progressing

## 2019-06-08 NOTE — Progress Notes (Signed)
Patient alert, on vanco 50 mg PO for infection prophylaxis, tolerated PO medications well, perma cath to right chest clean, dry and intact, applied topical skin cream to affected body part and neomycin to surgical incision site on her aabdomen, received pain medication once for the shift, stable and making progress

## 2019-06-08 NOTE — Plan of Care (Signed)
Problem: Safety  Goal: Patient will be free from injury during hospitalization  Outcome: Progressing  Flowsheets (Taken 06/07/2019 1557 by Peri Jefferson, RN)  Patient will be free from injury during hospitalization:   Assess patient's risk for falls and implement fall prevention plan of care per policy   Use appropriate transfer methods   Include patient/ family/ care giver in decisions related to safety   Ensure appropriate safety devices are available at the bedside  Goal: Patient will be free from infection during hospitalization  Outcome: Progressing  Flowsheets (Taken 06/07/2019 1557 by Peri Jefferson, RN)  Free from Infection during hospitalization:   Assess and monitor for signs and symptoms of infection   Monitor all insertion sites (i.e. indwelling lines, tubes, urinary catheters, and drains)   Monitor lab/diagnostic results     Problem: Renal Instability  Goal: Fluid and electrolyte balance are achieved/maintained  Outcome: Progressing  Flowsheets (Taken 06/07/2019 1736 by Mickel Baas, RN)  Fluid and electrolyte balance are achieved/maintained:   Monitor/assess lab values and report abnormal values   Assess and reassess fluid and electrolyte status   Monitor daily weight   Follow fluid restrictions/IV/PO parameters

## 2019-06-08 NOTE — Progress Notes (Signed)
[facility] PROGRESS NOTE  Aaron Edelman, x [phone] [facility] Spectralink)      Date Time: 06/08/19 7:31 AM  Patient Name: [name]  Attending Physician: Murlean Hark, MD    CC: follow-up ESRD    Assessment:     1. ESRD- previously on PD; switched to HD on  5/3   2. PD associated peritonitis - growing Pseudomonas and E. Coli, antibiotics change to zosyn --> po levoquin  - S/p PD catheter removal on 05/27/19  3. 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 Secondary hyperparathyroidism     Recommendations:   1. HD TTS  2. Stable from renal standpoint, will follow at [facility] rehab        Case discussed with: pt    Corky Mull, MD  [facility]  [phone] (office)  X [phone] [facility]-Link)    Subjective: no c/o   No issues on HD yesterday    Review of Systems:   Cardio:no chest pain  Pulm:no SOB  BE:[id number] at incision site    Physical Exam:     Vitals:    06/07/19 1705 06/07/19 2031 06/07/19 2342 06/08/19 0335   BP:  137/78 127/75 108/66   Pulse: 64 69 69 65   Resp: 18 18 18 18    Temp: 98 F (36.7 C) 98.2 F (36.8 C) 98.6 F (37 C) 97.7 F (36.5 C)   TempSrc:  Oral Oral Oral   SpO2: 100% 96% 98% 98%   Weight:    111 kg (244 lb 11.4 oz)   Height:           Intake and Output Summary (Last 24 hours) at Date Time    Intake/Output Summary (Last 24 hours) at 06/08/2019 0731  Last data filed at 06/08/2019 0335  Gross per 24 hour   Intake 350 ml   Output 3150 ml   Net -2800 ml       General: awake, alert, oriented x 3, no acute distress.  HEENT: sclera anicteric  Neck: supple  Extremities: improved LE edema  Neuro: A+O x 3, no gross motor/sensory deficits    Access R HDTC    Meds:      Scheduled Meds: PRN Meds:    acetaminophen, 1,000 mg, Oral, 4 times per day  amLODIPine, 5 mg, Oral, Q12H  atorvastatin, 40 mg, Oral, Daily  carvedilol, 25 mg, Oral, Q12H SCH  darbepoetin alfa, 60 mcg, Subcutaneous, Weekly  docusate sodium, 100 mg, Oral, Daily  gabapentin, 300 mg, Oral,  Daily  heparin (porcine), 5,000 Units, Subcutaneous, Q12H SCH  insulin glargine, 5 Units, Subcutaneous, Q12H  insulin lispro, 1-8 Units, Subcutaneous, TID AC  lactobacillus/streptococcus, 1 capsule, Oral, Daily  levoFLOXacin, 250 mg, Oral, Q48H  neomycin-bacitracin-polymyxin, , Topical, BID  pantoprazole, 40 mg, Oral, QAM AC  sevelamer, 800 mg, Oral, TID MEALS  vancomycin, 125 mg, Oral, Q6H  zinc Oxide, , Topical, Q6H          Continuous Infusions:   sodium chloride 20 mL/hr at 05/27/19 1100    sodium chloride 15 mL/hr at 06/02/19 2057    lactated ringers      sodium chloride, , PRN  bisacodyl, 10 mg, Daily PRN  dextrose, 15 g of glucose, PRN   And  dextrose, 12.5 g, PRN   And  glucagon (rDNA), 1 mg, PRN  dextrose, 15 g of glucose, PRN   And  dextrose, 12.5 g, PRN   And  glucagon (rDNA), 1 mg, PRN  diphenhydrAMINE, 25 mg, Q6H PRN  guaiFENesin-dextromethorphan, 5 mL, Q4H PRN  naloxone, 0.2 mg, PRN  ondansetron, 4 mg, Once PRN  ondansetron, 4 mg, Q6H PRN   Or  ondansetron, 4 mg, Q6H PRN  oxyCODONE, 5 mg, Q6H PRN   Or  oxyCODONE, 10 mg, Q6H PRN  petrolatum, , PRN              Labs:     Recent Labs   Lab 06/08/19  0339 06/06/19  0332 06/04/19  0536   WBC 7.77 9.03 9.82*   Hgb 8.0* 8.1* 7.6*   Hematocrit 27.6* 28.3* 26.5*   Platelets 145 167 170     Recent Labs   Lab 06/08/19  0339 06/06/19  0332 06/04/19  0536 06/03/19  0954 06/02/19  0345   Sodium 136 137 137  --   More results in Results Review   Potassium 4.1 4.2 4.2  --   More results in Results Review   Chloride 99* 100 102  --   More results in Results Review   CO2 25 27 24   --   More results in Results Review   BUN 24.0* 22.0* 23.0*  --   More results in Results Review   Creatinine 4.1* 4.4* 5.1*  --   More results in Results Review   Calcium 7.9* 7.7* 7.3*  --   More results in Results Review   Phosphorus  --   --   --  3.8  --    Glucose 216* 255* 263*  --   More results in Results Review   EGFR 13.2 12.2 10.3  --   More results in Results Review   More  results in Results Review = values in this interval not displayed.         Signed by: Corky Mull, MD

## 2019-06-08 NOTE — PT Progress Note (Signed)
[facility]  Physical Therapy Treatment    Patient:  [name]  MRN#:  [id number]  Unit:  [facility]  Room/Bed:  V3710/G2694.B    Time of treatment:  Time Calculation  PT Received On: 06/08/19  Start Time: 1450  Stop Time: 1514  Time Calculation (min): 24 min            Chart Review and Collaboration with Care Team: 4 minutes, not included in above time.    PT Visit Number: 4    Precautions:   Precautions  Weight Bearing Status: no restrictions  Precaution Instructions Given to Patient: Yes  Aspiration Precautions: see MD/RN orders  Other Precautions: Falls    Personal Protective Equipment (PPE)  gloves, procedure mask with face shield, surgical / bouffant cap, shoe covers and pt wore procedure mask    Updated X-Rays/Tests/Labs:  Lab Results   Component Value Date/Time    HGB 8.0 (L) 06/08/2019 03:39 AM    HCT 27.6 (L) 06/08/2019 03:39 AM    K 4.1 06/08/2019 03:39 AM    NA 136 06/08/2019 03:39 AM    INR 0.9 03/18/2006 03:00 PM    TROPI 0.04 05/17/2019 06:20 PM    TROPI 0.05 05/17/2019 01:37 PM    TROPI 0.01 03/18/2006 06:10 PM       All imaging reviewed, please see chart for details.      Subjective:  "I am not leaving here until I get these stitches out"     Patient Goal: to get these stitches out    Pain Assessment  Pain Assessment: Numeric Scale (0-10)  Pain Score: 7-severe pain  POSS Score: Awake and Alert  Pain Location: Abdomen  Pain Intervention(s): Repositioned;Medication (See eMAR)           Patient's medical condition is appropriate for Physical Therapy intervention at this time.  Patient is agreeable to participation in the therapy session.      Objective:  Observation of Patient/Vital Signs:  VSS t/o     Cognition/Neuro Status  Arousal/Alertness: Appropriate responses to stimuli  Attention Span: Appears intact  Orientation Level: Oriented X4  Memory: Appears intact  Following Commands: Follows all commands and directions without difficulty  Safety Awareness:  moderate verbal instruction  Insights: Fully aware of deficits;Educated in safety awareness  Problem Solving: Able to problem solve independently         Functional Mobility  Sit to Stand: Contact Guard Assist;with instruction for hand placement to increase safety  Stand to Sit: Contact Guard Assist  Transfers  Bed to Chair: Nurse, learning disability  Device Used for Functional Transfer: front-wheeled walker  Locomotion  Ambulation: with front-wheeled walker;Contact Guard Assist(with one loss of balance to the left with min assist)  Distance Walked (ft) (Step 6,7): 90 Feet        Therex:  Patient sat in chair and performed LAQs x10 B and ankle pumps x20 B.    Encouraged patient to use a small pillow when coughing or for transfers to help discomfort at incision site              Educated the patient to role of physical therapy, plan of care, goals of therapy and HEP, safety with mobility and ADLs with verbalized understanding.    Patient left in bedside chair with alarm and all other medical equipment in place and call bell and all personal items/needs within reach.  RN notified of session outcome.      Assessment:  Patient with increased ambulation distance with walker today however she did have one loss of balance to the right, however patient self corrected and stated "that her equilibrium is off sometimes".   Patient only has a cane at home but states her friend just found her a rollator (wheeled walker with seat) today that she can use.  Patients vitals stable throughout.  Patient states she does not want to leave hospital until her "stitches" (staples) are out.  Patient states she will not go to rehab because she does not have the money.  Patient states she has a daughter in [place].  Daughter helps with groceries about 2 times a month.    PMP Activity: Step 7 - Walks out of Room  Distance Walked (ft) (Step 6,7): 90 Feet    Plan:  Treatment/Interventions: Exercise, Gait training, LE strengthening/ROM, Endurance  training, Bed mobility, Continued evaluation      PT Frequency: 2-3x/wk   Continue plan of care.    Goals:  Goals  Goal Formulation: With patient  Time for Goal Acheivement: By time of discharge  Pt Will Go Supine To Sit: modified independent, to maximize functional mobility and independence, Partly met  Pt Will Perform Sit To Supine: modified independent, to maximize functional mobility and independence, Partly met  Pt Will Perform Sit to Stand: with supervision, to maximize functional mobility and independence, Partly met  Pt Will Transfer Bed/Chair: with rolling walker, with supervision, to maximize functional mobility and independence, Partly met  Pt Will Ambulate: 51-100 feet, with rolling walker, with stand by assist, to maximize functional mobility and independence, Partly met        DME Recommended for Discharge: Front wheel walker(Patient states that her friend just found her a rollator)  Discharge Recommendation: SNF(if home, will need HHPT/OT and supervision)    Kaylyn Layer, [place], DPT [phone]

## 2019-06-08 NOTE — UM Notes (Addendum)
[facility] COMPLETE CARE OF     CONCURRENT REVIEW Jun 08, 2019    UTILIZATION REVIEW CONTACT : Shanon Payor BSN, RN, ACM  Utilization Review Case Manager II / Case Management  [place] [facility]  [address]  [place], [place]  T [phone]  Jerilynn Mages [phone]   F [phone]    Email: [email or web address]  NPI: [id number] Tax ID:  [id number]      PATIENT NAME: [name],[name]  DOB: [birth year]        History of present illness: Pt is a 64 y.o. female arrived at [facility] on 05/27/2019 at 0433.and admitted to 23S     Inpatient appropriate for peritonitis from PD catheter with active infection/growth on Cx, need for catheter removal and ex lap, as well as transition to HD. Pt still complaining of abdominal pain    BP 126/76    Pulse 62    Temp 98.4 F (36.9 C) (Oral)    Resp 18    Ht 1.575 m (5\' 2" )    Wt 111 kg (244 lb 11.4 oz)    LMP  (LMP Unknown) Comment: post menopausal   SpO2 97%    BMI 44.76 kg/m     Temp:  [97.7 F (36.5 C)-98.6 F (37 C)]   Heart Rate:  [62-69]   Resp Rate:  [17-18]   BP: (100-138)/(64-76)   SpO2:  [97 %-100 %]   Weight:  [111 kg (244 lb 11.4 oz)]     Last recorded pain score:  Pain Scale Used: Numeric Scale (0-10)  Pain Score: 2-mild pain       Abnormal Labs:   Lab Results last 48 Hours     Procedure Component Value Units Date/Time    Glucose Whole Blood - POCT [[id number]]  (Abnormal) Collected: 06/08/19 0740     Updated: 06/08/19 0748     Whole Blood Glucose POCT 212 mg/dL     GFR [[id number]] Collected: 06/08/19 0339     Updated: 06/08/19 0616     EGFR 01.0    Basic Metabolic Panel [[id number]]  (Abnormal) Collected: 06/08/19 0339    Specimen: Blood Updated: 06/08/19 0616     Glucose 216 mg/dL      BUN 24.0 mg/dL      Creatinine 4.1 mg/dL      Calcium 7.9 mg/dL      Chloride 99 mEq/Young     Hemolysis index [[id number]]  (Abnormal) Collected: 06/08/19 0339     Updated: 06/08/19 0616     Hemolysis Index -1    CBC without differential [[id number]]  (Abnormal)  Collected: 06/08/19 0339     Hgb 8.0 g/dL      Hematocrit 27.6 %      RBC 2.82 x10 6/uL      MCV 97.9 fL      MCHC 29.0 g/dL     Glucose Whole Blood - POCT [[id number]]  (Abnormal) Collected: 06/07/19 2039     Updated: 06/07/19 2041     Whole Blood Glucose POCT 248 mg/dL     Glucose Whole Blood - POCT [[id number]]  (Abnormal) Collected: 06/07/19 1742     Updated: 06/07/19 1745     Whole Blood Glucose POCT 213 mg/dL     Glucose Whole Blood - POCT [[id number]]  (Abnormal) Collected: 06/07/19 1137     Updated: 06/07/19 1154     Whole Blood Glucose POCT 283 mg/dL     Glucose Whole Blood - POCT [[id number]]  (Abnormal) Collected: 06/07/19 0758  Updated: 06/07/19 0828     Whole Blood Glucose POCT 217 mg/dL     Glucose Whole Blood - POCT [[id number]]  (Abnormal) Collected: 06/06/19 2219     Updated: 06/06/19 2238     Whole Blood Glucose POCT 195 mg/dL     Glucose Whole Blood - POCT [[id number]]  (Abnormal) Collected: 06/06/19 2055     Updated: 06/06/19 2059     Whole Blood Glucose POCT 188 mg/dL     Glucose Whole Blood - POCT [[id number]]  (Abnormal) Collected: 06/06/19 1726     Updated: 06/06/19 1739     Whole Blood Glucose POCT 277 mg/dL         Per CM DCP note 5/15 [place]  DCP follow up with patient regarding medicaid aide waiver; [place] site down    MD Notes:  MD note 06/08/2019  CC: follow-up ESRD  Assessment:   1. ESRD- previously on PD; switched to HD on  5/3   2. PD associated peritonitis - growing Pseudomonas and E. Coli, antibiotics change to zosyn --> po levoquin  - S/p PD catheter removal on 05/27/19  3. 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 Secondary hyperparathyroidism   Recommendations:   1. HD TTS  2. Stable from renal standpoint, will follow at [facility] rehab    MD note 06/08/2019  ASSESSMENT/PLAN     [name] is a 64 y.o. female admitted with Generalized abdominal pain  Interval Summary:         Active Hospital Problems    Diagnosis    Bowel incontinence    Thrombocytopenia     Hypotension    Type 2 diabetes mellitus, with long-term current use of insulin    GERD (gastroesophageal reflux disease)    Generalized abdominal pain    ESRD (end stage renal disease) on dialysis    Peritonitis    Peritoneal dialysis catheter dysfunction, initial encounter       Presentation History  Per H&P "[name] McKethanis a 64 y.o.femalewith a PMHx of hypertension, hyperlipidemia, insulin-dependent diabetes mellitus, COPD, end-stage renal disease on peritoneal dialysis  Recently switched to hemodialysis and discharged yesterdayreturnto the ED with recurrent abdominal pain associated with Non bloodydiarrhea, nausea vomiting of nonbilious and nonbloody vomitus. Patient states that she continued to have loose watery stools on arriving at home yesterday followed by nausea vomiting 2-3 times, described abdominal pain as left-sided pain intensity greater than 10, pain is worse with palpation. She denies fever/chills, dysuria, chest pain, shortness of breath, blood in stool or vomitus. She had dialysis yesterday prior to discharge and have not arranged transportation for outpatient dialysis yet.    In the emergency room, CT abdomen pelvis showed partial small bowel obstruction, trace bilateral pleural effusions, revealed WBC of 15.22, H&H of 10.4/34.1,stool studies pending, infectious disease Dr. Lamona Curl Surgery Dr. Myrna Young".    Hospital Course (10Days):    Patient was admitted for PD peritonitis. Peritoneal fluid grew Pseudomonas. ID recommended IV Zosyn which was deescalated to Cipro based on susceptibility. ID had recommended continuing antibiotics till 5/19. Given increased abdominal pain zosyn was resumed 5/16.     Patient was also treated prophylactically for C. difficile. P.o. vancomycin was used. ID recommend continue p.o. vancomycin till 5/19. Patient's PD catheter was removed and she was started on hemodialysis.  Outpatient hemodialysis was set up prior to  discharge. PT assessed patient and recommended SNF but patient now wants to go home with home PT/OT  Patient Active Hospital Problem List:  Generalized abdominal pain (05/27/2019)  Peritoneal dialysis catheter dysfunction, initial encounter (05/12/2019)  Peritonitis (05/24/2019)  Assessment: PD catheter removed by general surgery with exploratory laparotomy on 05/28/2019. Pleural fluid growing Pseudomonas and E. Coli. ID has been following.  Increased abdominal pain in the past 24-48 hours.  No leukocytosis  Plan:   Repeat CT abd/pelvis with IV and PO contrast ordered to r/o intraabdominal vs abdominal wall abscess   Will request general surgery to evaluate surgical site for erythema  Oral vancomycin 125 mg every 6 hours has been continued as C. difficile prophylaxis given broad-spectrum antibiotics  incentive spirometry use encouraged   Additional pain regimen as below.     Bowel incontinence   Assessment-unclear if this is a complication of prior back injury; however this problem is chronic and has been going on for the past year  Plan-minimize risk for bedsores with frequent turning and quick response to any accidents    ESRD (end stage renal disease) on dialysis (05/24/2019)  Assessment: Patient was on PD prior to admission; she has been switched to hemodialysis for now; outpatient HD has been arranged for  Plan: Appreciate nephrology inputs   Outpatient dialysis to continue on a Tuesday Thursday Saturday schedule    Thrombocytopenia (05/31/2019)  Assessment: Could be due to the acute infection as above; platelet counts are stable  Plan: Okay to continue heparin for DVT prophylaxis  Monitor daily CBC    Hypotension (05/31/2019)  Assessment: Likely due to the infection above  Plan: Antibiotics as above    Type 2 diabetes mellitus, with long-term current use of insulin (05/31/2019)  Assessment: Chronic and unknown control  Plan: Continue with insulin as above   Diabetic diet     GERD  (gastroesophageal reflux disease) (05/31/2019)  Assessment: Chronic and unknown control  Plan: c/w home dose of protonix  Continue maalox      Analgesia: Oxycodone as needed and Tylenol scheduled  Continue with gabapentin 300 mg daily    Nutrition: Cardiac and diabetic    GI Prophylaxis: None    DVT Prophylaxis: Heparin    Code Status: Full code    DISPO: Discharge order canceled on account of increasing abdominal pain       SUBJECTIVE   Lowman states that her abdominal pain in the incision site is now 10/10.  No relief with Tylenol.  Notes notable improvement with oxycodone.      Medications: Scheduled Meds:  Current Facility-Administered Medications   Medication Dose Route Frequency    acetaminophen  1,000 mg Oral 4 times per day    amLODIPine  5 mg Oral Q12H    atorvastatin  40 mg Oral Daily    carvedilol  25 mg Oral Q12H SCH    darbepoetin alfa  60 mcg Subcutaneous Weekly    docusate sodium  100 mg Oral Daily    gabapentin  300 mg Oral Daily    heparin (porcine)  5,000 Units Subcutaneous Q12H [place]    insulin glargine  5 Units Subcutaneous Q12H    insulin lispro  1-8 Units Subcutaneous TID AC    lactobacillus/streptococcus  1 capsule Oral Daily    levoFLOXacin  250 mg Oral Q48H    neomycin-bacitracin-polymyxin   Topical BID    pantoprazole  40 mg Oral QAM AC    sevelamer  800 mg Oral TID MEALS    vancomycin  125 mg Oral Q6H    zinc Oxide   Topical Q6H  Continuous Infusions:   sodium chloride 20 mL/hr at 05/27/19 1100    sodium chloride 15 mL/hr at 06/02/19 2057    lactated ringers       PRN Meds:.sodium chloride, bisacodyl, Nursing communication: Adult Hypoglycemia Treatment Algorithm **AND** dextrose **AND** dextrose **AND** glucagon (rDNA), Nursing communication: Adult Hypoglycemia Treatment Algorithm **AND** dextrose **AND** dextrose **AND** glucagon (rDNA), diphenhydrAMINE, guaiFENesin-dextromethorphan, naloxone, ondansetron, ondansetron **OR** ondansetron,  oxyCODONE **OR** oxyCODONE, petrolatum

## 2019-06-09 ENCOUNTER — Encounter: Payer: Self-pay | Admitting: Internal Medicine

## 2019-06-09 ENCOUNTER — Inpatient Hospital Stay: Payer: 59

## 2019-06-09 LAB — GLUCOSE WHOLE BLOOD - POCT
Whole Blood Glucose POCT: 257 mg/dL — ABNORMAL HIGH (ref 70–100)
Whole Blood Glucose POCT: 217 mg/dL — ABNORMAL HIGH (ref 70–100)
Whole Blood Glucose POCT: 248 mg/dL — ABNORMAL HIGH (ref 70–100)
Whole Blood Glucose POCT: 266 mg/dL — ABNORMAL HIGH (ref 70–100)

## 2019-06-09 MED ORDER — SENNOSIDES-DOCUSATE SODIUM 8.6-50 MG PO TABS
1.0000 | ORAL_TABLET | Freq: Two times a day (BID) | ORAL | Status: DC
Start: 2019-06-09 — End: 2019-06-13
  Administered 2019-06-12: 18:00:00 1 via ORAL
  Filled 2019-06-09 (×6): qty 1

## 2019-06-09 MED ORDER — PIPERACILLIN-TAZOBACTAM 2.25 GM MBP (CNR)
2.25 g | Freq: Three times a day (TID) | INTRAVENOUS | Status: DC
Start: 2019-06-09 — End: 2019-06-11
  Administered 2019-06-09 – 2019-06-11 (×6): 2250 mg via INTRAVENOUS
  Filled 2019-06-09 (×7): qty 2250

## 2019-06-09 MED ORDER — IOHEXOL 350 MG/ML IV SOLN
100.00 mL | Freq: Once | INTRAVENOUS | Status: AC | PRN
Start: 2019-06-09 — End: 2019-06-09
  Administered 2019-06-09: 100 mL via INTRAVENOUS

## 2019-06-09 MED ORDER — IOHEXOL 12 MG/ML PO SOLN
1000.00 mL | Freq: Once | ORAL | Status: DC | PRN
Start: 2019-06-09 — End: 2019-06-13

## 2019-06-09 MED ORDER — LIDOCAINE HCL 1 % IJ SOLN
20.00 mL | Freq: Once | INTRAMUSCULAR | Status: AC
Start: 2019-06-09 — End: 2019-06-09
  Administered 2019-06-09: 12:00:00 20 mL
  Filled 2019-06-09: qty 20

## 2019-06-09 NOTE — Procedures (Signed)
DATE OF PROCEDURE: 06/09/2019     PROCEDURE PERFORMED:  Bedside abdominal wound exploration, wound vac placement    IN DICATIONS:[name] is a 64 y.o. y.o. female  who presents to the hospital with peritonitis from an infected PD catheter POD12 from PD catheter removal. She's been doing well tolerating a diet, whoever over the weekend developed worsening pain and some mild erythema at the wound site. CT scan done today shows some trace fluid in the wound with air, concerning for infection. Patient verbally agrees to have the wound opened and cultures taken.     DESCRIPTION OF PROCEDURE:  The wound was injected with 20cc of 1% lidocaine without epinephrine. The staples were removed and about 5cc of thin serous to sero-purulent fluid was expressed from the upper and middle portions of the wound. Wound cultures were taken x2 and sent to the lab. The fascial sutures were intact. The wound measured 8x6x3cm. White foam was placed over the most exposed portion of the fascial sutures, and black foam was placed into the entire incision above that. It was placed to -169mmHg suction. The patient tolerated the procedure well. Will plan for vac change on Wednesday with the wound ostomy care who present for the procedure and agrees.     Gennaro Africa MD  General Surgery, PGY-4      Procedure as above         Iran Sizer , MD [facility]  [facility]  [address] [place], [place] [address]  [phone]

## 2019-06-09 NOTE — Progress Notes (Signed)
INFECTIOUS DISEASES PROGRESS NOTE    Date Time: 06/09/19 9:18 AM  Patient Name: [name],[name]  Patient status.Inpatient  Hospital Day: 11    Assessment and Plan:   1.  PD catheter peritonitis/Pseudomonas  2. No fever or leucocytosis  3. Small collections seen on CT      Plan: 1. Zosyn  2.  p.o. vancomycin    Subjective:   Nontoxic    Review of Systems:   Review of Systems -abdominal pain   No diarrhea    Antibiotics:   Day 13    Other medications reviewed in EPIC.  Central Access:   New dialysis access day 7    Physical Exam:     Vitals:    06/09/19 0729   BP: 162/82   Pulse: 64   Resp: 16   Temp: 98.4 F (36.9 C)   SpO2: 99%       Lungs: Clear  Heart: Regular rhythm  Abdomen: Bowel sounds heard. Staples in. Dried blood in mid wound. No erythema or purluence seen    Labs:     Microbiology Results     Procedure Component Value Units Date/Time    Blood Culture Aerobic/Anaerobic #1 [[id number]] Collected: 05/27/19 0507    Specimen: Arm from Blood, Venipuncture Updated: 05/27/19 0950    Narrative:      1 BLUE+1 PURPLE    Blood Culture Aerobic/Anaerobic #2 [[id number]] Collected: 05/27/19 0507    Specimen: Arm from Blood, Venipuncture Updated: 05/27/19 0950    Narrative:      1 BLUE+1 PURPLE    COVID-19 (SARS-COV-2) [employer] [facility]) [[id number]] Collected: 05/27/19 0624    Specimen: Nasopharyngeal Swab from Nasopharynx Updated: 05/27/19 0656     Purpose of COVID testing Screening     SARS-CoV-2 Specimen Source Nasopharyngeal     SARS CoV 2 Overall Result Negative     Comment: Test performed using the Abbott ID NOW EUA assay.  Please see Fact Sheets for patients and providers located at:  http://olson-hall.info/  This test is for the qualitative detection of SARS-CoV-2  (COVID19) nucleic acid. Viral nucleic acids may persist in vivo,  independent of viability. Detection of viral nucleic acid does  not imply the presence of infectious virus, or that virus  nucleic acid is the cause of clinical symptoms.  Negative  results should be treated as presumptive and, if inconsistent  with clinical signs and symptoms or necessary for patient  management, should be tested with an alternative molecular  assay. Negative results do not preclude SARS-CoV-2 infection  and should not be used as the sole basis for patient  management decisions. Invalid results may be due to inhibiting  substances in the specimen and recollection should occur.         Narrative:      o Collect and clearly label specimen type:  o Upper respiratory specimen: One Nasopharyngeal Dry Swab NO  Transport Media.  o Hand deliver to laboratory ASAP  Indication for testing->Extended care facility admission to  semi private room    Culture + Gram Lenise Arena, Body Fluid [[id number]] Collected: 05/27/19 0534    Specimen: Body Fluid from Paracentesis Fluid Updated: 05/27/19 1009          CBC w/Diff CMP   Recent Labs   Lab 06/08/19  0339 06/06/19  0332 06/04/19  0536 06/03/19  0954 06/03/19  0954   WBC 7.77 9.03 9.82*  More results in Results Review 8.18   Hgb 8.0* 8.1* 7.6*  More results in  Results Review 8.3*   Hematocrit 27.6* 28.3* 26.5*  More results in Results Review 28.6*   Platelets 145 167 170  More results in Results Review 204   MCV 97.9* 99.0* 99.3*  More results in Results Review 98.6*   Neutrophils  --   --   --   --  67.5   More results in Results Review = values in this interval not displayed.       PT/INR         Recent Labs   Lab 06/08/19  0339 06/06/19  0332 06/04/19  0536 06/03/19  0954   Sodium 136 137 137  --    Potassium 4.1 4.2 4.2  --    Chloride 99* 100 102  --    CO2 25 27 24   --    BUN 24.0* 22.0* 23.0*  --    Creatinine 4.1* 4.4* 5.1*  --    Glucose 216* 255* 263*  --    Calcium 7.9* 7.7* 7.3*  --    Phosphorus  --   --   --  3.8      Glucose POCT   Recent Labs   Lab 06/08/19  0339 06/06/19  0332 06/04/19  0536   Glucose 216* 255* 263*          Rads:     Radiology Results (24 Hour)     Procedure Component Value Units Date/Time    CT  Abdomen Pelvis W WO IV Contrast and W PO Contrast [[id number]] Collected: 06/09/19 5465    Order Status: Completed Updated: 06/09/19 0915    Narrative:      History: Peritonitis    Pre and postcontrast CT. Oral contrast. 140 cc Omnipaque 350 IV  contrast.    Note: Note that CT scanning at this site  utilizes multiple dose  reduction techniques including automatic exposure control, adjustment of  the MAS and/or KVP according to patient's size and use of iterative  reconstruction technique  Comparison date 05/27/2019    Degenerative changes spine noted. Post procedural changes in the  intra-abdominal wall. Lung bases clear. Liver spleen pancreas  gallbladder unremarkable. No small bowel or colonic obstruction.  Moderate colorectal stool burden. Uterus harbors multiple calcified  fibroids. No worrisome masses seen.     There are poorly defined regions of loculated fluid seen along the  anterior margin of the uterus as well as within the right posterolateral  pelvis best seen on series 5 image 70 and 69. Collections in the right  deep hemipelvis measured 2.8 x 2.5 and 1.5 by 1.2 cm in diameter.  Collection seen anterior to the uterus measures approximately 8 x 2 cm  in diameter. These may be difficult to drain due to there location  adjacent to bowel loops. These appear similar to the prior study in  location in size although on the prior study without contrast these and  appearance that would been compatible with small amounts of abdominal  and pelvic free fluid expected in a patient on peritoneal dialysis.    Previously seen small bowel distention is no longer noted    Nurse Loyal Buba and Dr Stark Klein acknowledged being aware of the the preliminary  results at 913 am       Impression:       Small ill-defined collections of fluid anterior to the  uterus and along the right posterolateral margin of the uterus may  represent abscesses or loculated ascites in a patient with peritonitis  Elyn Peers, MD   06/09/2019 9:13 AM             Signed by: Ernst Breach

## 2019-06-09 NOTE — OT Progress Note (Addendum)
Occupational Therapy Note    [facility]   Occupational Therapy Attempt Note    Patient:  [name] MRN#:  [id number]  Unit:  [address] [place] Room/Bed:  M5784/O9629.B      Occupational Therapy treatment attempted on 06/09/2019 at 12:12 PM unable to complete secondary to pt getting IV placed by IV team.  Will reattempt later today.     RN aware.    [place], [place]  [phone]    06/09/2019 12:13 PM     [facility]   Occupational Therapy Attempt Note    Patient:  [name] MRN#:  [id number]  Unit:  [address] [place] Room/Bed:  M0102/V2536.B      Occupational Therapy treatment attempted on 06/09/2019 at 12:35 PM unable to complete secondary to pt reports significant pain from very recent wound vac placement.  Requesting follow up later today, will reattempt as able.    [place], [place]  [phone]    06/09/2019 12:35 PM     [facility]   Occupational Therapy Attempt Note    Patient:  [name] MRN#:  [id number]  Unit:  [address] [place] Room/Bed:  Q5956/L8756.B      Occupational Therapy treatment attempted on 06/09/2019 at 5:02 PM unable to complete secondary to pt politely declines 2/2 continued pain d/t wound vac placement earlier today.  Will follow up with pt at a later date.    [place], [place]  [phone]    06/09/2019 5:03 PM

## 2019-06-09 NOTE — Progress Notes (Signed)
Loose BM this am. Ambulated to restroom with walker and standby assist. Needed assist for clean-up/wiping due to pulling in incision. Will follow up with OT.

## 2019-06-09 NOTE — Progress Notes (Signed)
Patient reports abdominal pain this afternoon after vac application. Prn meds given with good relief. Will continue to assess for pain, provide meds as needed as ordered, and monitor for side effects.    Tolerating diet. Ambulating with walker and standby assist. Repositions self in bed. Requests external cath due to wound vac.     IV expired. IV team called to place new line. New iv in place at this time.     Safe environment provided. Hourly rounding discussed and done. Call bell, table and phone within reach. Fall prevention plan discussed and interventions in place. Education and emotional support provided. Plan of care and discharge plan discussed.    Will continue to assess patient, and will notify MD for changes outside normal limits.

## 2019-06-09 NOTE — Progress Notes (Signed)
Patient received 2 doses of IV abx zosyn 2.25 g for this shift, no adverse reactions had barium swallow of 1000 ml to drink prior to imaging studies to her abdomen, patient has been able to put down all the stated quantity without any problem. Diagnostic dept has scheduled patient for 0830 am, will send transport to pick up patient. Patient had a bowel movement today.

## 2019-06-09 NOTE — Progress Notes (Signed)
Wound Ostomy Continence Consultation    Date Time: 06/09/19 1:21 PM  Patient Name: [name], [name]  Consulting Service: WOCN  Reason for Consult: vac placement    Assessment   Assessment   Specialty Bed:   Centrella mattress  Braden Score: Braden Scale Score: 19 (06/09/19 0100)  BMI: Body mass index is 44.76 kg/m.  DIET: Glucerna Supplement Quantity: A. One; Flavor: Strawberry; Frequency: Daily with lunch  Diet consistent carbohydrate and renal Protein restriction: 80 GM Protein  Supervise For Meals Frequency: All meals    Patient assessed in room with Surgeon. Surgeon removed staples and applied NPTW to abdomen using 1 pice white foam, 1 piece black foam, dermatac drape. Plan to follow up on Wednesday. [name] applied. NPWT at -133mmHg, good seal.    Media Information (white foam in place)     Document Information    Photographs   Abd [id number]   06/09/2019 11:03   Attached To:   Hospital Encounter on 05/27/19   Source Information    Hanley Hays, RN   [facility] [place]    Wound, Ostomy, Continence Nurse Consult Note:    Procedure date: 05/28/2019     Performed by: Osborne Casco MD  Procedure performed: Exploratory laparotomy, lysis of intestinal adhesion and removal of peritoneal dialysis catheter/foreign  object    VAC last Changed: initiated 06/09/2019     Wound Assessment/Interventions:     Wound Description:  Location: abdomen  Measurements:Date 06/09/2019                            Length-  9                            Width-  6                            Depth-  3                            Undermining-  No                             Tunneling-   no  The appearance of wound bed and color:   Exudate: moderate serosanguinous  Is wound full thickness: yes  Is bone, tendon or muscle exposed:  Sutures in fascia      VAC Change:  Vac Serial Number:   Type of Foam used: black and white   Number of pieces used: 1 pc black, 1 pc white  Contact layer applied: no  Continuous negative pressure setting:  125 mm Hg, low, intensity    Plan/Recommendations:     1.  Wound care orders entered into EMR.   2.  Care rendered.  3.  RN checks wound VAC Q 2 hr: Sponge contracted;  pump "ON" and plugged in.    4.  If unable to maintain the seal, apply Tegaderm dressing or vac drape to seal. May also contact [facility] at 1 - [phone] to troubleshoot.  5.  If still unable to maintain the seal and vac off or not working for 2 hours, remove all foam and place hydrogel moistened gauze into wound bed, cover with ABD Young secured with tape,  and notify Physician and [place] team.          6.  Change three times a week. Next change expected:         When patient discharged or VAC discontinued -   1. Remove all foam dressing and non adherent dressing and place hydrogel moistened gauze into the wound bed, cover with ABD Young tape to secure.   2. Wound Vac machine - discard canister and tubing.    3. Place wound vac in CLEAR plastic bag and place in the soiled utility room. DO NOT USE RED BIOHAZARD BAGS.    4. Please contact [place] office [phone] and leave a message for WOC to pick up.         Plan:     Discharge VAC Wound Information    Prescribed [facility] VAC Therapy for duration:  4 months     Surgical       Age of wound/Date wound first noted: 05/28/2019  Did VAC initiate at the hospital?       yes        Date VAC Initiated: 06/09/2019  Compromised Nutritional status? no  Other therapies previously tried: primary closure  Is there eschar present?: no  Has wound been debrided in last ten days:                   Last Debridement:   Are serial debridements required: no       Type of Foam used: black simplace and white   Number of pieces used: 1 pc black, 1 pc white  Contact layer applied: no  Continuous negative pressure setting: 125 mm Hg, low, intensity  Size of foam used in hospital: small white foam, simplace foam  Size and type of foam recommended for home:   Has WOCN team attempted other dressings: was stapled, but now opened    WOCN assessed wound and  recommends the continuation of VAC at home for clinical reasons including:  Promotion of wound healing, speed growth of granulation tissue, prevention of infection            Wound care orders entered into EMR.  Care rendered.  [place] Nurse service to follow.         History of Presenting Illness   HPI:   [name] is a 64 y.o. female admitted with Generalized abdominal pain.  From H&P by Dr Lisette Grinder:  [name] is a 64 y.o. female with a PMHx of  hypertension, hyperlipidemia, insulin-dependent diabetes mellitus, COPD, end-stage renal disease on peritoneal dialysis   Recently switched to hemodialysis and discharged yesterday return to the ED with recurrent abdominal pain associated with Non bloody diarrhea, nausea vomiting of nonbilious and nonbloody vomitus.  Patient states that she continued to have loose watery stools on arriving at home yesterday followed by nausea vomiting 2-3 times, described abdominal pain as left-sided pain intensity greater than 10, pain is worse with palpation.  She denies fever/chills, dysuria, chest pain, shortness of breath, blood in stool or vomitus.  She had dialysis yesterday prior to discharge and have not arranged transportation for outpatient dialysis yet.    In the emergency room, CT abdomen pelvis showed partial small bowel obstruction, trace bilateral pleural effusions, revealed WBC of 15.22, H&H of 10.4/34.1, stool studies pending, infectious disease Dr. Lahoma Crocker and Surgery Dr. Signa Kell consulted.    Past Medical and Surgical History     Past Medical History:   Diagnosis Date  Chronic obstructive pulmonary disease     Diabetes mellitus     Hyperlipidemia     Hypertension     Sleep apnea 2018       Past Surgical History:   Procedure Laterality Date    EGD, BIOPSY N/A 05/14/2019    Procedure: EGD, BIOPSY;  Surgeon: Ronie Spies, MD;  Location: [facility];  Service: Gastroenterology;  Laterality: N/A;    EXPLORATORY LAPAROTOMY N/A 05/27/2019     Procedure: EXPLORATORY LAPAROTOMY;  Surgeon: Herbert Moors., MD;  Location: [facility];  Service: General;  Laterality: N/A;    JOINT REPLACEMENT      shoulder    JOINT REPLACEMENT      knee    OVARY SURGERY Left     Removal    REMOVAL, FOREIGN BODY N/A 05/27/2019    Procedure: REMOVAL, PERITONEAL DIALYSIS CATHETER;  Surgeon: Herbert Moors., MD;  Location: [facility];  Service: General;  Laterality: N/A;    TUNNELED CATH PLACEMENT ([place]) Bilateral 05/23/2019    Procedure: TUNNELED CATH PLACEMENT;  Surgeon: Jacques Earthly, MD;  Location: [facility];  Service: Interventional Radiology;  Laterality: Bilateral;       POD: 13 Days Post-Op Length of Stay:  LOS: 11 days       Lab   Significant Lab Values:  Lab Results   Component Value Date    WBC 7.77 06/08/2019    HGB 8.0 (L) 06/08/2019    HCT 27.6 (L) 06/08/2019    PLT 145 06/08/2019    ALT <6 05/30/2019    AST 10 05/30/2019    NA 136 06/08/2019    K 4.1 06/08/2019    CL 99 (L) 06/08/2019    CREAT 4.1 (H) 06/08/2019    BUN 24.0 (H) 06/08/2019    CO2 25 06/08/2019    INR 0.9 03/18/2006    GLU 216 (H) 06/08/2019    HGBA1C 9.8 (H) 05/13/2019     Lab Results   Component Value Date    PROT 4.7 (L) 05/30/2019    ALB 1.4 (L) 05/30/2019     Lab Results   Component Value Date    IRON 11 (L) 05/13/2019    TIBC 141 (L) 05/13/2019    FERRITIN 1,070.90 (H) 05/13/2019       Thank you for this consultation,    Hanley Hays, BSN, RN, CWOCN  Wound, Ostomy, and Continence Nurse Coordinator  [facility]  [address], [place], [place] [address]  T [phone] Jerilynn Mages [phone]  [email or web address]      p

## 2019-06-09 NOTE — Progress Notes (Signed)
GENERAL SURGERY PROGRESS NOTE  [facility] [id number]    Date Time: 06/09/19 7:02 AM  Patient Name: [facility] Day: 11    ASSESSMENT:     Patient Active Problem List   Diagnosis    Iron deficiency anemia secondary to inadequate dietary iron intake    Sideropenic dysphagia    Peritoneal dialysis catheter dysfunction, initial encounter    Peritonitis    Upper GI bleed    ESRD (end stage renal disease) on dialysis    Generalized abdominal pain    Thrombocytopenia    Hypotension    Type 2 diabetes mellitus, with long-term current use of insulin    GERD (gastroesophageal reflux disease)    Bowel incontinence     POD12 from PD catheter removal, ex-lap, LOA. Now with increased pain/tenderness at superior pole of wound, erythema somewhat increased.    PLAN:   Agree with CTa/p today, will follow-up results  Continue IV zosyn     SUBJECTIVE:   The patient is doing fairly well.  Abdominal pain is  Increased mostly at superior pole of incision.  Current dietary status:  Glucerna Supplement Quantity: A. One; Flavor: Strawberry; Frequency: Daily with lunch  Diet consistent carbohydrate and renal Protein restriction: 80 GM Protein  Supervise For Meals Frequency: All meals and tolerating well.  Flatus: yes. BM:  yes.  Additional complaints: none     Objective:   Current Vitals:   Vitals:    06/09/19 0502   BP: 160/72   Pulse: 63   Resp: 18   Temp: 97.5 F (36.4 C)   SpO2: 99%       Intake and Output Summary (Last 24 hours):  I/O last 3 completed shifts:  In: 760 [P.O.:750; I.V.:10]  Out: 150 [Urine:150]    Labs:     Results       Procedure Component Value Units Date/Time    Glucose Whole Blood - POCT [[id number]]  (Abnormal) Collected: 06/08/19 2112     Updated: 06/08/19 2115     Whole Blood Glucose POCT 264 mg/dL     Glucose Whole Blood - POCT [[id number]]  (Abnormal) Collected: 06/08/19 1652     Updated: 06/08/19 1707     Whole Blood Glucose POCT 221 mg/dL     Glucose Whole Blood - POCT [[id number]]  (Abnormal)  Collected: 06/08/19 1208     Updated: 06/08/19 1216     Whole Blood Glucose POCT 227 mg/dL             Rads:     Radiology Results (24 Hour)       Procedure Component Value Units Date/Time    CT Abdomen W WO IV Contrast and W PO Contrast [[id number]] Resulted: 06/09/19 0648    Order Status: Sent Updated: 06/09/19 0649              Medications:     Current Facility-Administered Medications   Medication Dose Route Frequency    acetaminophen  1,000 mg Oral 4 times per day    amLODIPine  5 mg Oral Q12H    atorvastatin  40 mg Oral Daily    carvedilol  25 mg Oral Q12H Vandervoort    darbepoetin alfa  60 mcg Subcutaneous Weekly    docusate sodium  100 mg Oral Daily    gabapentin  300 mg Oral Daily    heparin (porcine)  5,000 Units Subcutaneous Q12H [place]    insulin glargine  5 Units Subcutaneous Q12H  insulin lispro  1-8 Units Subcutaneous TID AC    lactobacillus/streptococcus  1 capsule Oral Daily    neomycin-bacitracin-polymyxin   Topical BID    pantoprazole  40 mg Oral QAM AC    piperacillin-tazobactam  2.25 g Intravenous Q6H    sevelamer  800 mg Oral TID MEALS    vancomycin  125 mg Oral Q6H    zinc Oxide   Topical Q6H      sodium chloride 20 mL/hr at 05/27/19 1100    sodium chloride 15 mL/hr at 06/02/19 2057    lactated ringers       sodium chloride, bisacodyl, Nursing communication: Adult Hypoglycemia Treatment Algorithm **AND** dextrose **AND** dextrose **AND** glucagon (rDNA), Nursing communication: Adult Hypoglycemia Treatment Algorithm **AND** dextrose **AND** dextrose **AND** glucagon (rDNA), diphenhydrAMINE, guaiFENesin-dextromethorphan, iohexol, naloxone, ondansetron, ondansetron **OR** ondansetron, oxyCODONE **OR** oxyCODONE, petrolatum     Physical Exam:     General appearance - alert, well appearing, and in no distress  Mental status - alert, oriented to person, place, and time  Chest - non-labored  Heart - normal rate and regular rhythm  Abdomen - soft, with localized tenderness and swelling at superior pole of  wound, no rebound or guarding.  Wound - clean, erythema, tender  Extremities - intact peripheral pulses    Signed by: Sharon Seller      This patient was personally seen and examined by me and I agree with the assessment and plan above.  All notes, labs, and xrays were reviewed.  The patient was seen on the date the note was written.    Ct reviewed. Some air and fluid in the subcutaneous space of the incision. Mild erythema above it. Will plan to remove staples, culture and do wound care as needed, possible vac    Iran Sizer , MD [facility]  [facility]  [address] [place], [place] [address]  [phone]

## 2019-06-09 NOTE — Plan of Care (Signed)
Problem: Moderate/High Fall Risk Score >5  Goal: Patient will remain free of falls  Outcome: Progressing  Flowsheets  Taken 06/09/2019 0100 by Sebastian Ache, RN  Moderate Risk (6-13):   MOD-Consider activation of bed alarm if appropriate   MOD-Apply bed exit alarm if patient is confused   MOD-Floor mat at bedside (where available) if appropriate   MOD-Consider a move closer to [place]   MOD-Remain with patient during toileting   MOD-Place bedside commode and assistive devices out of sight when not in use  Taken 06/07/2019 1014 by Peri Jefferson, RN  High (Greater than 13):   HIGH-Bed alarm on at all times while patient in bed   HIGH-Apply yellow "Fall Risk" arm band  Taken 06/01/2019 1534 by Jory Sims, RN  VH High Risk (Greater than 13):   Use chair-pad alarm device   Use of floor mat   A CHAIR PAD ALARM WILL BE USED WHEN PATIENT IS UP SITTING IN A CHAIR     Problem: Safety  Goal: Patient will be free from injury during hospitalization  Outcome: Progressing  Goal: Patient will be free from infection during hospitalization  Outcome: Progressing     Problem: Pain  Goal: Pain at adequate level as identified by patient  Outcome: Progressing  Flowsheets (Taken 06/07/2019 1557 by Peri Jefferson, RN)  Pain at adequate level as identified by patient:   Identify patient comfort function goal   Assess for risk of opioid induced respiratory depression, including snoring/sleep apnea. Alert healthcare team of risk factors identified.   Assess pain on admission, during daily assessment and/or before any "as needed" intervention(s)   Reassess pain within 30-60 minutes of any procedure/intervention, per Pain Assessment, Intervention, Reassessment (AIR) Cycle     Problem: Side Effects from Pain Analgesia  Goal: Patient will experience minimal side effects of analgesic therapy  Outcome: Progressing     Problem: Discharge Barriers  Goal: Patient will be discharged home or other facility with appropriate resources  Outcome:  Progressing     Problem: Psychosocial and Spiritual Needs  Goal: Demonstrates ability to cope with hospitalization/illness  Outcome: Progressing     Problem: Altered GI Function  Goal: Fluid and electrolyte balance are achieved/maintained  Outcome: Progressing  Goal: Elimination patterns are normal or improving  Outcome: Progressing  Goal: Nutritional intake is adequate  Outcome: Progressing  Goal: Mobility/Activity is maintained at optimal level for patient  Outcome: Progressing  Goal: No bleeding  Outcome: Progressing     Problem: Compromised Tissue integrity  Goal: Damaged tissue is healing and protected  Outcome: Progressing  Goal: Nutritional status is improving  Outcome: Progressing     Problem: Renal Instability  Goal: Fluid and electrolyte balance are achieved/maintained  Outcome: Progressing  Goal: Free from infection  Outcome: Progressing  Flowsheets (Taken 06/07/2019 1736 by Mickel Baas, RN)  Free from infection: Monitor/assess for signs and symptoms of infection     Problem: Patient Receiving Advanced Renal Therapies  Goal: Therapy access site remains intact  Outcome: Progressing

## 2019-06-09 NOTE — Progress Notes (Signed)
[facility] PROGRESS NOTE  Aaron Edelman, x [phone] [facility] Spectralink)      Date Time: 06/09/19 1:46 PM  Patient Name: [name]  Attending Physician: Murlean Hark, MD    CC: follow-up ESRD    Assessment:     1. ESRD- previously on PD; switched to HD on  5/3   2. PD associated peritonitis - growing Pseudomonas and E. Coli, antibiotics change to zosyn --> po levoquin  - S/p PD catheter removal on 05/27/19  3. 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 Secondary hyperparathyroidism     Recommendations:   1. HD tomorrow  2. Stable from renal standpoint      Case discussed with: pt    Corky Mull, MD  [facility]  [phone] (office)  X [phone] [facility] Spectra-Link)    Subjective:  Had bedside abdominal wound exploration today    Review of Systems:   Cardio:no chest pain  Pulm:no SOB  YM:[id number] at incision site    Physical Exam:     Vitals:    06/09/19 0729 06/09/19 0918 06/09/19 1210 06/09/19 1244   BP: 162/82 161/79 154/74 134/78   Pulse: 64 67  63   Resp: 16   18   Temp: 98.4 F (36.9 C)   97.9 F (36.6 C)   TempSrc: Oral   Oral   SpO2: 99%   98%   Weight:       Height:           Intake and Output Summary (Last 24 hours) at Date Time    Intake/Output Summary (Last 24 hours) at 06/09/2019 1346  Last data filed at 06/09/2019 0500  Gross per 24 hour   Intake 0 ml   Output 0 ml   Net 0 ml       General: awake, alert, oriented x 3, no acute distress.  HEENT: sclera anicteric  Neck: supple  Extremities: improved LE edema  Neuro: A+O x 3, no gross motor/sensory deficits    Access R HDTC    Meds:      Scheduled Meds: PRN Meds:    acetaminophen, 1,000 mg, Oral, 4 times per day  amLODIPine, 5 mg, Oral, Q12H  atorvastatin, 40 mg, Oral, Daily  carvedilol, 25 mg, Oral, Q12H [facility]  darbepoetin alfa, 60 mcg, Subcutaneous, Weekly  gabapentin, 300 mg, Oral, Daily  heparin (porcine), 5,000 Units, Subcutaneous, Q12H [facility]  insulin glargine, 5 Units, Subcutaneous, Q12H  insulin lispro, 1-8 Units,  Subcutaneous, TID AC  lactobacillus/streptococcus, 1 capsule, Oral, Daily  neomycin-bacitracin-polymyxin, , Topical, BID  pantoprazole, 40 mg, Oral, QAM AC  piperacillin-tazobactam, 2.25 g, Intravenous, Q8H  senna-docusate, 1 tablet, Oral, BID  sevelamer, 800 mg, Oral, TID MEALS  vancomycin, 125 mg, Oral, Q6H  zinc Oxide, , Topical, Q6H          Continuous Infusions:   sodium chloride 20 mL/hr at 05/27/19 1100    sodium chloride 15 mL/hr at 06/02/19 2057    lactated ringers      sodium chloride, , PRN  bisacodyl, 10 mg, Daily PRN  dextrose, 15 g of glucose, PRN   And  dextrose, 12.5 g, PRN   And  glucagon (rDNA), 1 mg, PRN  dextrose, 15 g of glucose, PRN   And  dextrose, 12.5 g, PRN   And  glucagon (rDNA), 1 mg, PRN  diphenhydrAMINE, 25 mg, Q6H PRN  guaiFENesin-dextromethorphan, 5 mL, Q4H PRN  iohexol, 1,000 mL, ONCE PRN  naloxone, 0.2 mg,  PRN  ondansetron, 4 mg, Once PRN  ondansetron, 4 mg, Q6H PRN   Or  ondansetron, 4 mg, Q6H PRN  oxyCODONE, 5 mg, Q6H PRN   Or  oxyCODONE, 10 mg, Q6H PRN  petrolatum, , PRN              Labs:     Recent Labs   Lab 06/08/19  0339 06/06/19  0332 06/04/19  0536   WBC 7.77 9.03 9.82*   Hgb 8.0* 8.1* 7.6*   Hematocrit 27.6* 28.3* 26.5*   Platelets 145 167 170     Recent Labs   Lab 06/08/19  0339 06/06/19  0332 06/04/19  0536 06/03/19  0954   Sodium 136 137 137  --    Potassium 4.1 4.2 4.2  --    Chloride 99* 100 102  --    CO2 25 27 24   --    BUN 24.0* 22.0* 23.0*  --    Creatinine 4.1* 4.4* 5.1*  --    Calcium 7.9* 7.7* 7.3*  --    Phosphorus  --   --   --  3.8   Glucose 216* 255* 263*  --    EGFR 13.2 12.2 10.3  --          Signed by: Corky Mull, MD

## 2019-06-09 NOTE — Progress Notes (Signed)
HOSPITALIST PROGRESS NOTE      Patient: [name]  Date: 06/09/2019    LOS: 11 Days  Admission Date: 05/27/2019   MRN: [id number]  Attending: Murlean Hark  MD, MPH                    Please contact via [place]: [phone]                    Pager: [phone]     ASSESSMENT/PLAN     [name] is a 64 y.o. female admitted with Generalized abdominal pain    Interval Summary:     Active Hospital Problems    Diagnosis    Bowel incontinence    Thrombocytopenia    Hypotension    Type 2 diabetes mellitus, with long-term current use of insulin    GERD (gastroesophageal reflux disease)    Generalized abdominal pain    ESRD (end stage renal disease) on dialysis    Peritonitis    Peritoneal dialysis catheter dysfunction, initial encounter       Presentation History   Per H&P "[name] McKethanis a 64 y.o.femalewith a PMHx of hypertension, hyperlipidemia, insulin-dependent diabetes mellitus, COPD, end-stage renal disease on peritoneal dialysis  Recently switched to hemodialysis and discharged yesterdayreturnto the ED with recurrent abdominal pain associated with Non bloodydiarrhea, nausea vomiting of nonbilious and nonbloody vomitus. Patient states that she continued to have loose watery stools on arriving at home yesterday followed by nausea vomiting 2-3 times, described abdominal pain as left-sided pain intensity greater than 10, pain is worse with palpation. She denies fever/chills, dysuria, chest pain, shortness of breath, blood in stool or vomitus. She had dialysis yesterday prior to discharge and have not arranged transportation for outpatient dialysis yet.    In the emergency room, CT abdomen pelvis showed partial small bowel obstruction, trace bilateral pleural effusions, revealed WBC of 15.22, H&H of 10.4/34.1,stool studies pending, infectious disease Dr. Lamona Young Surgery Dr. Myrna Young".    Hospital Course (10 Days):    Patient was admitted  for PD peritonitis.  Peritoneal fluid grew Pseudomonas.  ID recommended IV Zosyn which was deescalated to Cipro based on susceptibility.  ID had recommended continuing antibiotics till 5/19. Given increased abdominal pain zosyn was resumed 5/16.     Patient was also treated prophylactically for C. difficile.  P.o. vancomycin was used.  ID recommend continue p.o. vancomycin till 5/19. Patient's PD catheter was removed and she was started on hemodialysis.  Outpatient hemodialysis was set up prior to discharge. PT assessed patient and recommended SNF but patient now wants to go home with home PT/OT.     Over this past weekend  patient developed increasing surgical site pain.  Continues to have persistent erythema and exquisite tenderness over certain areas of the surgical wound.  No discharge was noted.  On evaluation with CAT scan on 5/16 she was found to have evidence of surgical site infection with possible air at this time.  Bedside debridement revealed 5 cc of serous vs purulent collection.  After debridement a wound VAC was applied. No wound dehiscence noted.    Patient Active Hospital Problem List:  Generalized abdominal pain (05/27/2019)  Peritoneal dialysis catheter dysfunction, initial encounter (05/12/2019)  Peritonitis (05/24/2019)  Assessment: PD catheter removed by general surgery with exploratory laparotomy on 05/28/2019. Pleural fluid growing Pseudomonas and E. Coli.  Wound site complication noted with increased pain warranting debridement and wound vac placement on 5/17.   Plan:   Follow up wound culture  Discussed with general surgery and infectious disease  Appreciate wound VAC placement by wound nurse  Continue broad-spectrum antibiotic coverage with IV Zosyn  Follow-up on cultures  Oral vancomycin 125 mg every 6 hours has been continued as C. difficile prophylaxis   incentive spirometry use encouraged   Pain regimen as below.     Bowel incontinence   Assessment-unclear if this is a complication of  prior back injury; however this problem is chronic and has been going on for the past year  Plan-minimize risk for bedsores with frequent turning and quick response to any accidents    ESRD (end stage renal disease) on dialysis (05/24/2019)  Assessment: Patient was on PD prior to admission; she has been switched to hemodialysis for now; outpatient HD has been arranged for  Plan: Appreciate nephrology inputs   Outpatient dialysis to continue on a Tuesday Thursday Saturday schedule    Thrombocytopenia (05/31/2019)  Assessment: Could be due to the acute infection as above; platelet counts are stable  Plan: Okay to continue heparin for DVT prophylaxis  Monitor daily CBC    Hypotension (05/31/2019)  Assessment: Likely due to the infection above  Plan: Antibiotics as above    Type 2 diabetes mellitus, with long-term current use of insulin (05/31/2019)  Assessment: Chronic and unknown control  Plan: Continue with insulin as above   Diabetic diet     GERD (gastroesophageal reflux disease) (05/31/2019)  Assessment: Chronic and unknown control  Plan: c/w home dose of protonix  Continue maalox      Analgesia: Oxycodone as needed and Tylenol scheduled  Continue with gabapentin 300 mg daily    Nutrition: Cardiac and diabetic    GI Prophylaxis: None    DVT Prophylaxis: Heparin    Code Status: Full code    DISPO: Surgical site wound evaluation on Wednesday to reassess.  Anticipate discharge home with wound VAC versus wet-to-dry dressing based on recommendations of general surgery at that time.        [name] states that her abdominal wound is feeling much better after the placement of wound VAC today.  Has packing with food    Febrile- None   Eating- Well   Coughing- None   Diarrhea- None   Sleeping - Well   Voiding - Well        MEDICATIONS     Current Facility-Administered Medications   Medication Dose Route Frequency    acetaminophen  1,000 mg Oral 4 times per day    amLODIPine  5  mg Oral Q12H    atorvastatin  40 mg Oral Daily    carvedilol  25 mg Oral Q12H SCH    darbepoetin alfa  60 mcg Subcutaneous Weekly    gabapentin  300 mg Oral Daily    heparin (porcine)  5,000 Units Subcutaneous Q12H Desha    insulin glargine  5 Units Subcutaneous Q12H    insulin lispro  1-8 Units Subcutaneous TID AC    lactobacillus/streptococcus  1 capsule Oral Daily    neomycin-bacitracin-polymyxin   Topical BID    pantoprazole  40 mg Oral QAM AC    piperacillin-tazobactam  2.25 g Intravenous Q8H    senna-docusate  1 tablet Oral BID    sevelamer  800 mg Oral TID MEALS    vancomycin  125 mg Oral Q6H  zinc Oxide   Topical Q6H       PHYSICAL EXAM     Vitals:    06/09/19 1951   BP: 111/67   Pulse: 71   Resp: 16   Temp: 97.7 F (36.5 C)   SpO2: 98%       Temperature: Temp  Min: 97.5 F (36.4 C)  Max: 98.4 F (36.9 C)  Pulse: Pulse  Min: 63  Max: 71  Respiratory: Resp  Min: 16  Max: 18  Non-Invasive BP: BP  Min: 111/67  Max: 162/82  Pulse Oximetry SpO2  Min: 96 %  Max: 99 %    Intake and Output Summary (Last 24 hours) at Date Time    Intake/Output Summary (Last 24 hours) at 06/09/2019 2029  Last data filed at 06/09/2019 0500  Gross per 24 hour   Intake 0 ml   Output 0 ml   Net 0 ml       Physical Exam   Constitutional: She is oriented to person, place, and time and well-developed, well-nourished, and in no distress. No distress.   HENT:   Head: Normocephalic and atraumatic.   Right Ear: External ear normal.   Left Ear: External ear normal.   Mouth/Throat: Oropharynx is clear and moist.   Eyes: Pupils are equal, round, and reactive to light.   Neck: No JVD present. No tracheal deviation present. No thyromegaly present.   Cardiovascular: Normal rate, regular rhythm, normal heart sounds and intact distal pulses. Exam reveals no gallop and no friction rub.   No murmur heard.  Pulmonary/Chest: Effort normal and breath sounds normal. No stridor. No respiratory distress. She has no wheezes. She has no rales. She  exhibits no tenderness.   Abdominal: Soft. Bowel sounds are normal. She exhibits no distension and no mass. There is abdominal tenderness (Surgical site has staples removed and wound is packing with wound vac dressing. Minimal local tenderness. ). There is no rebound and no guarding.   Musculoskeletal:         General: No tenderness, deformity or edema. Normal range of motion.      Cervical back: Normal range of motion and neck supple.   Lymphadenopathy:     She has no cervical adenopathy.   Neurological: She is alert and oriented to person, place, and time. She has normal reflexes. No cranial nerve deficit. She exhibits normal muscle tone. Gait normal. Coordination normal. GCS score is 15.   Skin: Skin is warm and dry. No rash noted. She is not diaphoretic. No erythema. No pallor.   Psychiatric: Mood, memory, affect and judgment normal.     LABS     Recent Labs   Lab 06/08/19  0339 06/06/19  0332 06/04/19  0536   WBC 7.77 9.03 9.82*   RBC 2.82* 2.86* 2.67*   Hgb 8.0* 8.1* 7.6*   Hematocrit 27.6* 28.3* 26.5*   MCV 97.9* 99.0* 99.3*   Platelets 145 167 170       Recent Labs   Lab 06/08/19  0339 06/06/19  0332 06/04/19  0536   Sodium 136 137 137   Potassium 4.1 4.2 4.2   Chloride 99* 100 102   CO2 25 27 24    BUN 24.0* 22.0* 23.0*   Creatinine 4.1* 4.4* 5.1*   Glucose 216* 255* 263*   Calcium 7.9* 7.7* 7.3*                         Microbiology Results  Procedure Component Value Units Date/Time    Anaerobic culture [[id number]] Collected: 05/27/19 2343    Specimen: Other from Wound Updated: 06/02/19 0822    Narrative:      ORDER#: [id number]                                    ORDERED BY: Kalman Jewels  SOURCE: Wound abdominal wound                        COLLECTED:  05/27/19 23:43  ANTIBIOTICS AT COLL.:                                RECEIVED :  05/28/19 07:56  Culture, Anaerobic Bacteria                FINAL       06/02/19 08:22  06/02/19   No anaerobic growth      Anaerobic culture [[id number]] Collected: 05/27/19  2343    Specimen: Other from Drainage Updated: 06/02/19 0822    Narrative:      blue swab  ORDER#: [id number]                                    ORDERED BY: Kalman Jewels  SOURCE: Drainage peritoneal fluid                    COLLECTED:  05/27/19 23:43  ANTIBIOTICS AT COLL.:                                RECEIVED :  05/28/19 07:56  ORDER ENTRY COMMENTS:  blue swab  Culture, Anaerobic Bacteria                FINAL       06/02/19 08:22  06/02/19   No anaerobic growth      Blood Culture Aerobic/Anaerobic #1 [[id number]] Collected: 05/27/19 0507    Specimen: Arm from Blood, Venipuncture Updated: 06/01/19 1221    Narrative:      ORDER#: [id number]                                    ORDERED BY: CHUNG, SORA  SOURCE: Blood, Venipuncture arm                      COLLECTED:  05/27/19 05:07  ANTIBIOTICS AT COLL.:                                RECEIVED :  05/27/19 09:50  Culture Blood Aerobic and Anaerobic        FINAL       06/01/19 12:21  06/01/19   No growth after 5 days of incubation.      Blood Culture Aerobic/Anaerobic #2 [[id number]] Collected: 05/27/19 0507    Specimen: Arm from Blood, Venipuncture Updated: 06/01/19 1221    Narrative:      ORDER#: [id number]  ORDERED BY: CHUNG, SORA  SOURCE: Blood, Venipuncture arm                      COLLECTED:  05/27/19 05:07  ANTIBIOTICS AT COLL.:                                RECEIVED :  05/27/19 09:50  Culture Blood Aerobic and Anaerobic        FINAL       06/01/19 12:21  06/01/19   No growth after 5 days of incubation.      COVID-19 (SARS-COV-2) [employer]) [[id number]] Collected: 05/27/19 0624    Specimen: Nasopharyngeal Swab from Nasopharynx Updated: 05/27/19 0656     Purpose of COVID testing Screening     SARS-CoV-2 Specimen Source Nasopharyngeal     SARS CoV 2 Overall Result Negative     Comment: Test performed using the Abbott ID NOW EUA assay.  Please see Fact Sheets for patients and providers located at:   http://[facility].info/  This test is for the qualitative detection of SARS-CoV-2  (COVID19) nucleic acid. Viral nucleic acids may persist in vivo,  independent of viability. Detection of viral nucleic acid does  not imply the presence of infectious virus, or that virus  nucleic acid is the cause of clinical symptoms. Negative  results should be treated as presumptive and, if inconsistent  with clinical signs and symptoms or necessary for patient  management, should be tested with an alternative molecular  assay. Negative results do not preclude SARS-CoV-2 infection  and should not be used as the sole basis for patient  management decisions. Invalid results may be due to inhibiting  substances in the specimen and recollection should occur.         Narrative:      o Collect and clearly label specimen type:  o Upper respiratory specimen: One Nasopharyngeal Dry Swab NO  Transport Media.  o Hand deliver to laboratory ASAP  Indication for testing->Extended care facility admission to  semi private room    Culture + Gram Lenise Arena, Body Fluid [[id number]] Collected: 05/27/19 0534    Specimen: Body Fluid from Paracentesis Fluid Updated: 06/01/19 1601    Narrative:      [id number] called Micro Results of Gram Stain. Results read back by: [id number], by  [id number] on 05/27/2019 at 14:31  < 65ml  ORDER#: [id number]                                    ORDERED BY: CHUNG, SORA  SOURCE: Paracentesis Fluid peritoneal dialysis cathetCOLLECTED:  05/27/19 05:34  ANTIBIOTICS AT COLL.:                                RECEIVED :  05/27/19 10:09  ORDER ENTRY COMMENTS:  < 57ml  [id number] called Micro Results of Gram Stain. Results read back by: [id number], by [id number] on 05/27/2019 at 14:31  Stain, Gram                                FINAL       05/27/19 14:31  05/27/19   No Squamous epithelial cells seen             No WBCs  seen             Rare Gram positive cocci             Stain performed on Cytospin (concentrated) specimen  Culture and Gram  Stain, Aerobic, Body FluidFINAL       06/01/19 16:01  05/30/19   The volume of sample received with this order is less than             the recommended volume. Submission of low volumes may             adversely affect recovery of pathogens and culture results             may be compromised.  06/01/19   ** No growth             ** Note:             ** Gram positive cocci seen in Gram Stain did not grow under             ** aerobic and anaerobic culture conditions, possible nonviable.      Fungus culture [[id number]] Collected: 05/27/19 2343    Specimen: Other from Wound Updated: 06/04/19 1529    Narrative:      ORDER#: [id number]                                    ORDERED BY: Kalman Jewels  SOURCE: Wound abdominal wound                        COLLECTED:  05/27/19 23:43  ANTIBIOTICS AT COLL.:                                RECEIVED :  05/28/19 07:56  Stain, Fungal                              FINAL       05/29/19 12:20  05/29/19   No Fungal or Yeast Elements Seen  Culture Fungus                             PRELIM      06/04/19 15:29  06/04/19   No growth after 1 week/s of incubation.      Fungus culture [[id number]] Collected: 05/27/19 0259    Specimen: Other from Drainage Updated: 06/04/19 1529    Narrative:      blue swab  ORDER#: [id number]                                    ORDERED BY: Kalman Jewels  SOURCE: Drainage peritoneal fluid                    COLLECTED:  05/27/19 02:59  ANTIBIOTICS AT COLL.:                                RECEIVED :  05/28/19 07:56  ORDER ENTRY COMMENTS:  blue swab  Stain, Fungal  FINAL       05/29/19 12:20  05/29/19   No Fungal or Yeast Elements Seen  Culture Fungus                             PRELIM      06/04/19 15:29  06/04/19   No growth after 1 week/s of incubation.      Wound culture & gram stain [[id number]] Collected: 05/27/19 2343    Specimen: Wound Updated: 05/30/19 0829    Narrative:      blue swab  ORDER#: [id number]                                     ORDERED BY: Baird Cancer, ENOCH  SOURCE: Wound peritoneal fluid                       COLLECTED:  05/27/19 23:43  ANTIBIOTICS AT COLL.:                                RECEIVED :  05/28/19 07:56  ORDER ENTRY COMMENTS:  blue swab  Stain, Gram                                FINAL       05/28/19 13:06  05/28/19   Few WBCs             Rare Squamous epithelial cells             No organisms seen  Culture and Gram Stain, Aerobic, Wound     FINAL       05/30/19 08:29   +  05/29/19   Light growth of Pseudomonas aeruginosa               Refer to susceptibilities on culture #[id number]        Wound culture & gram stain [[id number]] Collected: 05/27/19 0221    Specimen: Wound Updated: 05/30/19 0828    Narrative:      ORDER#: [id number]                                    ORDERED BY: Kalman Jewels  SOURCE: Wound abdominal wound                        COLLECTED:  05/27/19 02:21  ANTIBIOTICS AT COLL.:                                RECEIVED :  05/28/19 07:56  Stain, Gram                                FINAL       05/28/19 13:03  05/28/19   Moderate WBCs             Few Squamous epithelial cells             No organisms seen  Culture and Gram Stain, Aerobic, Wound     FINAL       05/30/19  08:28   +  05/30/19   Light growth of Pseudomonas aeruginosa      _____________________________________________________________________________                                  P.aeruginosa    ANTIBIOTICS                     MIC  INTRP      _____________________________________________________________________________  Amikacin                        <=8    S        Aztreonam                        8     S        Cefepime                         2     S        Ceftazidime                     <=2    S        Ciprofloxacin                 <=0.25   S        Gentamicin                      <=2    S        Levofloxacin                   <=0.5   S        Meropenem                      <=0.5   S        Piperacillin/Tazobactam         4/4    S        Tobramycin                       <=2    S        _____________________________________________________________________________            S=SUSCEPTIBLE     I=INTERMEDIATE     R=RESISTANT                            N/S=NON-SUSCEPTIBLE  _____________________________________________________________________________             CT Abdomen Pelvis W WO IV Contrast and W PO Contrast    Result Date: 06/09/2019   Small ill-defined collections of fluid anterior to the uterus and along the right posterolateral margin of the uterus may represent abscesses or loculated ascites in a patient with peritonitis Elyn Peers, MD  06/09/2019 9:13 AM      Echo Results     None          I have personally reviewed the patient's labs, medications, and imaging    Total visit time = 40 mins ; more than 50% spent counseling/coordinating care    Signed,    Diane Hark MD, MPH  8:29 PM 06/09/2019   Please contact via Secure  Chat   [employer]: [phone]  Pager: [phone]       This chart was generated using a medical voice-recognition software which does not employ spell-checking or grammar-checking features. It was dictated, all or in part, in a busy and often noisy patient care environment. I have taken all usual measures to dictate carefully and to review all aspects of this chart. Nonetheless, given the known and well-documented performance characteristics of voice recognition software in such patient care environments, this dictation still may contain unrecognized and wholly unintended errors or omissions.

## 2019-06-10 LAB — GLUCOSE WHOLE BLOOD - POCT
Whole Blood Glucose POCT: 275 mg/dL — ABNORMAL HIGH (ref 70–100)
Whole Blood Glucose POCT: 169 mg/dL — ABNORMAL HIGH (ref 70–100)
Whole Blood Glucose POCT: 198 mg/dL — ABNORMAL HIGH (ref 70–100)
Whole Blood Glucose POCT: 189 mg/dL — ABNORMAL HIGH (ref 70–100)
Whole Blood Glucose POCT: 275 mg/dL — ABNORMAL HIGH (ref 70–100)

## 2019-06-10 LAB — BASIC METABOLIC PANEL
Anion Gap: 11 (ref 5.0–15.0)
BUN: 44 mg/dL — ABNORMAL HIGH (ref 7.0–19.0)
CO2: 23 mEq/L (ref 22–29)
Calcium: 7.9 mg/dL — ABNORMAL LOW (ref 8.5–10.5)
Chloride: 99 mEq/L — ABNORMAL LOW (ref 100–111)
Creatinine: 6.5 mg/dL — ABNORMAL HIGH (ref 0.6–1.0)
Glucose: 288 mg/dL — ABNORMAL HIGH (ref 70–100)
Potassium: 4.1 mEq/L (ref 3.5–5.1)
Sodium: 133 mEq/L — ABNORMAL LOW (ref 136–145)

## 2019-06-10 LAB — CBC
Absolute NRBC: 0 10*3/uL (ref 0.00–0.00)
Hematocrit: 26.6 % — ABNORMAL LOW (ref 34.7–43.7)
Hgb: 7.7 g/dL — ABNORMAL LOW (ref 11.4–14.8)
MCH: 27.9 pg (ref 25.1–33.5)
MCHC: 28.9 g/dL — ABNORMAL LOW (ref 31.5–35.8)
MCV: 96.4 fL — ABNORMAL HIGH (ref 78.0–96.0)
MPV: 11.6 fL (ref 8.9–12.5)
Nucleated RBC: 0 /100 WBC (ref 0.0–0.0)
Platelets: 157 10*3/uL (ref 142–346)
RBC: 2.76 10*6/uL — ABNORMAL LOW (ref 3.90–5.10)
RDW: 13 % (ref 11–15)
WBC: 7.4 10*3/uL (ref 3.10–9.50)

## 2019-06-10 LAB — HEMOLYSIS INDEX
Hemolysis Index: 1 (ref 0–18)
Hemolysis Index: 1 (ref 0–18)

## 2019-06-10 LAB — GFR: EGFR: 7.8

## 2019-06-10 LAB — HEPATITIS B SURFACE ANTIGEN W/ REFLEX TO CONFIRMATION: Hepatitis B Surface Antigen: NONREACTIVE

## 2019-06-10 MED ORDER — LIDOCAINE 5 % EX PTCH
1.00 | MEDICATED_PATCH | CUTANEOUS | Status: DC
Start: 2019-06-10 — End: 2019-06-13
  Administered 2019-06-10 – 2019-06-12 (×3): 1 via TRANSDERMAL
  Filled 2019-06-10 (×3): qty 1

## 2019-06-10 MED ORDER — ALBUMIN HUMAN 25 % IV SOLN
100.0000 mL | INTRAVENOUS | Status: AC | PRN
Start: 2019-06-10 — End: 2019-06-10
  Filled 2019-06-10: qty 100

## 2019-06-10 MED ORDER — SODIUM CHLORIDE 0.9 % IV BOLUS
100.0000 mL | INTRAVENOUS | Status: AC | PRN
Start: 2019-06-10 — End: 2019-06-10

## 2019-06-10 MED ORDER — SODIUM CHLORIDE 0.9 % IV BOLUS
250.0000 mL | INTRAVENOUS | Status: AC | PRN
Start: 2019-06-10 — End: 2019-06-10

## 2019-06-10 NOTE — Plan of Care (Addendum)
Problem: Moderate/High Fall Risk Score >5  Goal: Patient will remain free of falls  Outcome: Progressing  Flowsheets (Taken 06/09/2019 1951)  Moderate Risk (6-13):   MOD-Consider activation of bed alarm if appropriate   MOD-Floor mat at bedside (where available) if appropriate     Problem: Pain  Goal: Pain at adequate level as identified by patient  Outcome: Progressing  Flowsheets (Taken 06/10/2019 0617)  Pain at adequate level as identified by patient:   Identify patient comfort function goal   Assess pain on admission, during daily assessment and/or before any "as needed" intervention(s)   Reassess pain within 30-60 minutes of any procedure/intervention, per Pain Assessment, Intervention, Reassessment (AIR) Cycle   Evaluate if patient comfort function goal is met     Problem: Altered GI Function  Goal: Elimination patterns are normal or improving  Outcome: Progressing  Flowsheets (Taken 06/10/2019 0617)  Elimination patterns are normal or improving:   Report abnormal assessment to physician   Anticipate/assist with toileting needs   Assess for normal bowel sounds   Monitor for abdominal distension   Monitor for abdominal discomfort     Problem: Compromised Tissue integrity  Goal: Damaged tissue is healing and protected  Outcome: Progressing  Flowsheets (Taken 06/10/2019 0617)  Damaged tissue is healing and protected:   Monitor/assess Braden scale every shift   Keep intact skin clean and dry     Problem: Patient Receiving Advanced Renal Therapies  Goal: Therapy access site remains intact  Outcome: Progressing  Flowsheets (Taken 06/10/2019 0617)  Therapy access site remains intact: Assess therapy access site     Pt reports 7-8/10 abdominal pain, relieved by Oxycodone. Abdominal wound vac shows no additional drainage overnight. HD orders remain for yesterday.

## 2019-06-10 NOTE — Progress Notes (Signed)
HOSPITALIST PROGRESS NOTE      Patient: [name]  Date: 06/10/2019    LOS: 12 Days  Admission Date: 05/27/2019   MRN: [id number]  Attending: Delfina Redwood  MD, MPH                    Please contact via Secure Chat                 ASSESSMENT/PLAN     [name] is a 64 y.o. female admitted with Generalized abdominal pain    Interval Summary:     Active Hospital Problems    Diagnosis    Bowel incontinence    Thrombocytopenia    Hypotension    Type 2 diabetes mellitus, with long-term current use of insulin    GERD (gastroesophageal reflux disease)    Generalized abdominal pain    ESRD (end stage renal disease) on dialysis    Peritonitis    Peritoneal dialysis catheter dysfunction, initial encounter       Presentation History   Per H&P "Hanadi L McKethanis a 64 y.o.femalewith a PMHx of hypertension, hyperlipidemia, insulin-dependent diabetes mellitus, COPD, end-stage renal disease on peritoneal dialysis  Recently switched to hemodialysis and discharged yesterdayreturnto the ED with recurrent abdominal pain associated with Non bloodydiarrhea, nausea vomiting of nonbilious and nonbloody vomitus. Patient states that she continued to have loose watery stools on arriving at home yesterday followed by nausea vomiting 2-3 times, described abdominal pain as left-sided pain intensity greater than 10, pain is worse with palpation. She denies fever/chills, dysuria, chest pain, shortness of breath, blood in stool or vomitus. She had dialysis yesterday prior to discharge and have not arranged transportation for outpatient dialysis yet.    In the emergency room, CT abdomen pelvis showed partial small bowel obstruction, trace bilateral pleural effusions, revealed WBC of 15.22, H&H of 10.4/34.1,stool studies pending, infectious disease Dr. Lamona Curl Surgery Dr. Myrna Blazer".    Hospital Course (10 Days):    Patient was admitted for PD peritonitis.  Peritoneal fluid grew  Pseudomonas.  ID recommended IV Zosyn which was deescalated to Cipro based on susceptibility.  ID had recommended continuing antibiotics till 5/19. Given increased abdominal pain zosyn was resumed 5/16.     Patient was also treated prophylactically for C. difficile.  P.o. vancomycin was used.  ID recommend continue p.o. vancomycin till 5/19. Patient's PD catheter was removed and she was started on hemodialysis.  Outpatient hemodialysis was set up prior to discharge. PT assessed patient and recommended SNF but patient now wants to go home with home PT/OT.     Over this past weekend  patient developed increasing surgical site pain.  Continues to have persistent erythema and exquisite tenderness over certain areas of the surgical wound.  No discharge was noted.  On evaluation with CAT scan on 5/16 she was found to have evidence of surgical site infection with possible air at this time.  Bedside debridement revealed 5 cc of serous vs purulent collection.  After debridement a wound VAC was applied. No wound dehiscence noted.    Patient Active Hospital Problem List:  Generalized abdominal pain (05/27/2019)  Peritoneal dialysis catheter dysfunction, initial encounter (05/12/2019)  Peritonitis (05/24/2019)  Assessment: PD catheter removed by general surgery with exploratory laparotomy on 05/28/2019. Pleural fluid growing Pseudomonas and E. Coli. Wound site complication noted with increased pain warranting debridement and wound vac placement on 5/17.   Plan:   Follow up wound culture  Discussed with general surgery  and infectious disease  Appreciate wound VAC placement by wound nurse  Continue broad-spectrum antibiotic coverage with IV Zosyn  Follow-up on cultures  Oral vancomycin 125 mg every 6 hours has been continued as C. difficile prophylaxis   incentive spirometry use encouraged   Pain regimen as below.   Wound VAC change tomorrow 5/19    Bowel incontinence   Assessment-unclear if this is a complication of prior back  injury; however this problem is chronic and has been going on for the past year  Plan-minimize risk for bedsores with frequent turning and quick response to any accidents    ESRD (end stage renal disease) on dialysis (05/24/2019)  Assessment: Patient was on PD prior to admission; she has been switched to hemodialysis for now; outpatient HD has been arranged for  Plan: Appreciate nephrology inputs   Outpatient dialysis to continue on a Tuesday Thursday Saturday schedule    Thrombocytopenia (05/31/2019)  Assessment: Could be due to the acute infection as above; platelet counts are stable  Plan: Okay to continue heparin for DVT prophylaxis  Monitor daily CBC    Hypotension (05/31/2019)  Assessment: Likely due to the infection above  Plan: Antibiotics as above    Type 2 diabetes mellitus, with long-term current use of insulin (05/31/2019)  Assessment: Chronic and unknown control  Plan: Continue with insulin as above   Diabetic diet     GERD (gastroesophageal reflux disease) (05/31/2019)  Assessment: Chronic and unknown control  Plan: c/w home dose of protonix  Continue maalox      Analgesia: Oxycodone as needed and Tylenol scheduled  Continue with gabapentin 300 mg daily    Nutrition: Cardiac and diabetic    GI Prophylaxis: None    DVT Prophylaxis: Heparin    Code Status: Full code    DISPO: Surgical site wound evaluation on Wednesday to reassess.  Anticipate discharge home with wound VAC versus wet-to-dry dressing based on recommendations of general surgery at that time.        [place] states that her abdominal wound is feeling much better after the placement of wound VAC.  No new concerns today      MEDICATIONS     Current Facility-Administered Medications   Medication Dose Route Frequency    acetaminophen  1,000 mg Oral 4 times per day    amLODIPine  5 mg Oral Q12H    atorvastatin  40 mg Oral Daily    carvedilol  25 mg Oral Q12H SCH    darbepoetin alfa  60 mcg  Subcutaneous Weekly    gabapentin  300 mg Oral Daily    heparin (porcine)  5,000 Units Subcutaneous Q12H [place]    insulin glargine  5 Units Subcutaneous Q12H    insulin lispro  1-8 Units Subcutaneous TID AC    lactobacillus/streptococcus  1 capsule Oral Daily    neomycin-bacitracin-polymyxin   Topical BID    pantoprazole  40 mg Oral QAM AC    piperacillin-tazobactam  2.25 g Intravenous Q8H    senna-docusate  1 tablet Oral BID    sevelamer  800 mg Oral TID MEALS    vancomycin  125 mg Oral Q6H    zinc Oxide   Topical Q6H       PHYSICAL EXAM     Vitals:    06/10/19 0800   BP: 105/66   Pulse: (!) 59   Resp: 16   Temp: 97.3 F (36.3 C)   SpO2: 97%  Temperature: Temp  Min: 97.3 F (36.3 C)  Max: 98.2 F (36.8 C)  Pulse: Pulse  Min: 59  Max: 71  Respiratory: Resp  Min: 15  Max: 18  Non-Invasive BP: BP  Min: 105/66  Max: 161/79  Pulse Oximetry SpO2  Min: 96 %  Max: 98 %    Intake and Output Summary (Last 24 hours) at Date Time    Intake/Output Summary (Last 24 hours) at 06/10/2019 0905  Last data filed at 06/10/2019 0600  Gross per 24 hour   Intake 500 ml   Output 0 ml   Net 500 ml       Physical Exam   Constitutional: She is oriented to person, place, and time and well-developed, well-nourished, and in no distress. No distress.   HENT:   Head: Normocephalic and atraumatic.   Right Ear: External ear normal.   Left Ear: External ear normal.   Mouth/Throat: Oropharynx is clear and moist.   Eyes: Pupils are equal, round, and reactive to light.   Neck: No JVD present. No tracheal deviation present. No thyromegaly present.   Cardiovascular: Normal rate, regular rhythm, normal heart sounds and intact distal pulses. Exam reveals no gallop and no friction rub.   No murmur heard.  Pulmonary/Chest: Effort normal and breath sounds normal. No stridor. No respiratory distress. She has no wheezes. She has no rales. She exhibits no tenderness.   Abdominal: Soft. Bowel sounds are normal. She exhibits no distension and no  mass. There is abdominal tenderness (Surgical site has staples removed and wound is packing with wound vac dressing. Minimal local tenderness. ). There is no rebound and no guarding.   Musculoskeletal:         General: No tenderness, deformity or edema. Normal range of motion.      Cervical back: Normal range of motion and neck supple.   Lymphadenopathy:     She has no cervical adenopathy.   Neurological: She is alert and oriented to person, place, and time. She has normal reflexes. No cranial nerve deficit. She exhibits normal muscle tone. Gait normal. Coordination normal. GCS score is 15.   Skin: Skin is warm and dry. No rash noted. She is not diaphoretic. No erythema. No pallor.   Psychiatric: Mood, memory, affect and judgment normal.     LABS     Recent Labs   Lab 06/10/19  0321 06/08/19  0339 06/06/19  0332   WBC 7.40 7.77 9.03   RBC 2.76* 2.82* 2.86*   Hgb 7.7* 8.0* 8.1*   Hematocrit 26.6* 27.6* 28.3*   MCV 96.4* 97.9* 99.0*   Platelets 157 145 167       Recent Labs   Lab 06/10/19  0321 06/08/19  0339 06/06/19  0332 06/04/19  0536   Sodium 133* 136 137 137   Potassium 4.1 4.1 4.2 4.2   Chloride 99* 99* 100 102   CO2 23 25 27 24    BUN 44.0* 24.0* 22.0* 23.0*   Creatinine 6.5* 4.1* 4.4* 5.1*   Glucose 288* 216* 255* 263*   Calcium 7.9* 7.9* 7.7* 7.3*                         Microbiology Results     Procedure Component Value Units Date/Time    Anaerobic culture [[id number]] Collected: 05/27/19 2343    Specimen: Other from Wound Updated: 06/02/19 0822    Narrative:      ORDER#: [id number]  ORDERED BY: SANDERS, ENOCH  SOURCE: Wound abdominal wound                        COLLECTED:  05/27/19 23:43  ANTIBIOTICS AT COLL.:                                RECEIVED :  05/28/19 07:56  Culture, Anaerobic Bacteria                FINAL       06/02/19 08:22  06/02/19   No anaerobic growth      Anaerobic culture [[id number]] Collected: 05/27/19 2343    Specimen: Other from Drainage Updated:  06/02/19 0822    Narrative:      blue swab  ORDER#: [id number]                                    ORDERED BY: Kalman Jewels  SOURCE: Drainage peritoneal fluid                    COLLECTED:  05/27/19 23:43  ANTIBIOTICS AT COLL.:                                RECEIVED :  05/28/19 07:56  ORDER ENTRY COMMENTS:  blue swab  Culture, Anaerobic Bacteria                FINAL       06/02/19 08:22  06/02/19   No anaerobic growth      Blood Culture Aerobic/Anaerobic #1 [[id number]] Collected: 05/27/19 0507    Specimen: Arm from Blood, Venipuncture Updated: 06/01/19 1221    Narrative:      ORDER#: [id number]                                    ORDERED BY: CHUNG, SORA  SOURCE: Blood, Venipuncture arm                      COLLECTED:  05/27/19 05:07  ANTIBIOTICS AT COLL.:                                RECEIVED :  05/27/19 09:50  Culture Blood Aerobic and Anaerobic        FINAL       06/01/19 12:21  06/01/19   No growth after 5 days of incubation.      Blood Culture Aerobic/Anaerobic #2 [[id number]] Collected: 05/27/19 0507    Specimen: Arm from Blood, Venipuncture Updated: 06/01/19 1221    Narrative:      ORDER#: [id number]                                    ORDERED BY: CHUNG, SORA  SOURCE: Blood, Venipuncture arm                      COLLECTED:  05/27/19 05:07  ANTIBIOTICS AT COLL.:  RECEIVED :  05/27/19 09:50  Culture Blood Aerobic and Anaerobic        FINAL       06/01/19 12:21  06/01/19   No growth after 5 days of incubation.      COVID-19 (SARS-COV-2) [employer]) [[id number]] Collected: 05/27/19 0624    Specimen: Nasopharyngeal Swab from Nasopharynx Updated: 05/27/19 0656     Purpose of COVID testing Screening     SARS-CoV-2 Specimen Source Nasopharyngeal     SARS CoV 2 Overall Result Negative     Comment: Test performed using the Abbott ID NOW EUA assay.  Please see Fact Sheets for patients and providers located at:  http://[facility].info/  This test is for the qualitative detection  of SARS-CoV-2  (COVID19) nucleic acid. Viral nucleic acids may persist in vivo,  independent of viability. Detection of viral nucleic acid does  not imply the presence of infectious virus, or that virus  nucleic acid is the cause of clinical symptoms. Negative  results should be treated as presumptive and, if inconsistent  with clinical signs and symptoms or necessary for patient  management, should be tested with an alternative molecular  assay. Negative results do not preclude SARS-CoV-2 infection  and should not be used as the sole basis for patient  management decisions. Invalid results may be due to inhibiting  substances in the specimen and recollection should occur.         Narrative:      o Collect and clearly label specimen type:  o Upper respiratory specimen: One Nasopharyngeal Dry Swab NO  Transport Media.  o Hand deliver to laboratory ASAP  Indication for testing->Extended care facility admission to  semi private room    Culture + Gram Lenise Arena, Body Fluid [[id number]] Collected: 05/27/19 0534    Specimen: Body Fluid from Paracentesis Fluid Updated: 06/01/19 1601    Narrative:      [id number] called Micro Results of Gram Stain. Results read back by: [id number], by  [id number] on 05/27/2019 at 14:31  < 6ml  ORDER#: [id number]                                    ORDERED BY: CHUNG, SORA  SOURCE: Paracentesis Fluid peritoneal dialysis cathetCOLLECTED:  05/27/19 05:34  ANTIBIOTICS AT COLL.:                                RECEIVED :  05/27/19 10:09  ORDER ENTRY COMMENTS:  < 79ml  [id number] called Micro Results of Gram Stain. Results read back by: [id number], by [id number] on 05/27/2019 at 14:31  Stain, Gram                                FINAL       05/27/19 14:31  05/27/19   No Squamous epithelial cells seen             No WBCs seen             Rare Gram positive cocci             Stain performed on Cytospin (concentrated) specimen  Culture and Gram Stain, Aerobic, Body FluidFINAL       06/01/19 16:01  05/30/19   The volume of  sample received with this order is less than  the recommended volume. Submission of low volumes may             adversely affect recovery of pathogens and culture results             may be compromised.  06/01/19   ** No growth             ** Note:             ** Gram positive cocci seen in Gram Stain did not grow under             ** aerobic and anaerobic culture conditions, possible nonviable.      Fungus culture [[id number]] Collected: 05/27/19 2343    Specimen: Other from Wound Updated: 06/04/19 1529    Narrative:      ORDER#: [id number]                                    ORDERED BY: Kalman Jewels  SOURCE: Wound abdominal wound                        COLLECTED:  05/27/19 23:43  ANTIBIOTICS AT COLL.:                                RECEIVED :  05/28/19 07:56  Stain, Fungal                              FINAL       05/29/19 12:20  05/29/19   No Fungal or Yeast Elements Seen  Culture Fungus                             PRELIM      06/04/19 15:29  06/04/19   No growth after 1 week/s of incubation.      Fungus culture [[id number]] Collected: 05/27/19 0259    Specimen: Other from Drainage Updated: 06/04/19 1529    Narrative:      blue swab  ORDER#: [id number]                                    ORDERED BY: Kalman Jewels  SOURCE: Drainage peritoneal fluid                    COLLECTED:  05/27/19 02:59  ANTIBIOTICS AT COLL.:                                RECEIVED :  05/28/19 07:56  ORDER ENTRY COMMENTS:  blue swab  Stain, Fungal                              FINAL       05/29/19 12:20  05/29/19   No Fungal or Yeast Elements Seen  Culture Fungus                             PRELIM      06/04/19 15:29  06/04/19   No growth after 1 week/s  of incubation.      Wound culture & gram stain [[id number]] Collected: 05/27/19 2343    Specimen: Wound Updated: 05/30/19 0829    Narrative:      blue swab  ORDER#: [id number]                                    ORDERED BY: Kalman Jewels  SOURCE: Wound peritoneal fluid                        COLLECTED:  05/27/19 23:43  ANTIBIOTICS AT COLL.:                                RECEIVED :  05/28/19 07:56  ORDER ENTRY COMMENTS:  blue swab  Stain, Gram                                FINAL       05/28/19 13:06  05/28/19   Few WBCs             Rare Squamous epithelial cells             No organisms seen  Culture and Gram Stain, Aerobic, Wound     FINAL       05/30/19 08:29   +  05/29/19   Light growth of Pseudomonas aeruginosa               Refer to susceptibilities on culture #[id number]        Wound culture & gram stain [[id number]] Collected: 05/27/19 0221    Specimen: Wound Updated: 05/30/19 0828    Narrative:      ORDER#: [id number]                                    ORDERED BY: Kalman Jewels  SOURCE: Wound abdominal wound                        COLLECTED:  05/27/19 02:21  ANTIBIOTICS AT COLL.:                                RECEIVED :  05/28/19 07:56  Stain, Gram                                FINAL       05/28/19 13:03  05/28/19   Moderate WBCs             Few Squamous epithelial cells             No organisms seen  Culture and Gram Stain, Aerobic, Wound     FINAL       05/30/19 08:28   +  05/30/19   Light growth of Pseudomonas aeruginosa      _____________________________________________________________________________                                  P.aeruginosa    ANTIBIOTICS  MIC  INTRP      _____________________________________________________________________________  Amikacin                        <=8    S        Aztreonam                        8     S        Cefepime                         2     S        Ceftazidime                     <=2    S        Ciprofloxacin                 <=0.25   S        Gentamicin                      <=2    S        Levofloxacin                   <=0.5   S        Meropenem                      <=0.5   S        Piperacillin/Tazobactam         4/4    S        Tobramycin                      <=2    S         _____________________________________________________________________________            S=SUSCEPTIBLE     I=INTERMEDIATE     R=RESISTANT                            N/S=NON-SUSCEPTIBLE  _____________________________________________________________________________             CT Abdomen Pelvis W WO IV Contrast and W PO Contrast    Result Date: 06/09/2019   Small ill-defined collections of fluid anterior to the uterus and along the right posterolateral margin of the uterus may represent abscesses or loculated ascites in a patient with peritonitis Elyn Peers, MD  06/09/2019 9:13 AM      Echo Results     None          I have personally reviewed the patient's labs, medications, and imaging    Total visit time = 40 mins ; more than 50% spent counseling/coordinating care    Signed,    Reynaldo Minium MD, MPH  9:05 AM 06/10/2019   Please contact via Secure Chat        This chart was generated using a medical voice-recognition software which does not employ spell-checking or grammar-checking features. It was dictated, all or in part, in a busy and often noisy patient care environment. I have taken all usual measures to dictate carefully and to review all aspects of this chart. Nonetheless, given the known and well-documented performance characteristics of voice recognition software in such patient care environments, this dictation  still may contain unrecognized and wholly unintended errors or omissions.

## 2019-06-10 NOTE — Progress Notes (Signed)
Pt arrived to the HD unit AOX4 she complain of slight soreness in abdomen area surgery site. Vital stable. R CVC WDL dressing due for changed today. Orders reviewed, R CVC accessed using aseptic technique.  HD initiated with no problems using 2K 2.5 ca bath.  HD nurse will monitor closely.         06/10/19 0950   Permacath/Temporary Catheter 05/23/19 Permacath Right   Placement Date/Time: 05/23/19 1342   Inserted by: Pap[place]ouris  Access Type: Permacath  Orientation: Right  Access Location: Tunneled, Chest  Catheter Tip Location: svc  Central Line Infection Prevention Education provided?: Yes;No  Hand Hygiene: [place]...   Line necessity reviewed? Apheresis/hemodialysis   Catheter Lumen Volume Venous 1.7 mL   Catheter Lumen Volume Arterial 1.7 mL   Dressing Status and Intervention Dressing Intact   Tego Caps on Catheter Yes   NEW Tego Caps placed (Date) 06/07/19   APHERESIS ONLY:  Access  Red   APHERESIS ONLY: Return Blue   End Caps Free From Blood Yes   Line Care Connections checked and tightened   Dressing Type Transparent   Dressing Status Clean;Intact;Dry   Dressing/Line Intervention   ((n/A))   Dressing Change Assistant ([facility] Only)   ((n/A))   Biopatch Used? ([facility] Only) Yes   Curos Caps Used? ([facility] Only) Yes   Line Used For Blood Draw No   Dressing Change Due 06/10/19   Vitals   Heart Rate 67   Resp Rate 16   BP 118/58   SpO2 100 %   O2 Device None (Room air)   Machine Metrics   $Treatment Started/Capturing Charge Yes   Blood Flow Rate (mL/min) 150 mL/min   Arterial Pressure (mmHg) -30 mmHg   Venous Pressure (mmHg) 40   Dialysate Flow Rate (mL/min) 700 mL/min   Transmembrane Pressure (mmHg) 30 mmHg   Ultrafiltration Rate (mL/Hr) 700 mL/hr   Fluid Removal (ml) 0 ml   Fluid Bolus (ml) 200 ml   Dialysate K (mEq/L) 2 mEq/L   Dialysate CA (mEq/L) 2.5 mEq/L   Hemodialysis Comments   Arteriovenous Lines Secure Yes   Comments HD initiated R CVC

## 2019-06-10 NOTE — Progress Notes (Signed)
GENERAL SURGERY PROGRESS NOTE  [facility] [phone]    Date Time: 06/10/19 6:28 AM  Patient Name: [name] Day: 12    ASSESSMENT:     Patient Active Problem List   Diagnosis    Iron deficiency anemia secondary to inadequate dietary iron intake    Sideropenic dysphagia    Peritoneal dialysis catheter dysfunction, initial encounter    Peritonitis    Upper GI bleed    ESRD (end stage renal disease) on dialysis    Generalized abdominal pain    Thrombocytopenia    Hypotension    Type 2 diabetes mellitus, with long-term current use of insulin    GERD (gastroesophageal reflux disease)    Bowel incontinence     POD13 from PD catheter removal, ex-lap, LOA.   CTa/p with residual pelvic fluid collections  No e/o sepsis at this time  PPD1 from wound exploration, vac placement - no organisms seen on cultures so far    PLAN:   Continue wound vac  Will add a lidocaine patch for pain just lateral to the wound, likely nerve pain  Vac change tomorrow with WOCN  Cont ABx per ID    SUBJECTIVE:   The patient is doing fairly well.  Abdominal pain is  No pain with rest, however severe pain lateral to wound vac with ambulation.  Current dietary status:  Glucerna Supplement Quantity: A. One; Flavor: Strawberry; Frequency: Daily with lunch  Diet consistent carbohydrate and renal Protein restriction: 80 GM Protein  Supervise For Meals Frequency: All meals and tolerating well.  Flatus: yes. BM:  yes.  Additional complaints: none     Objective:   Current Vitals:   Vitals:    06/10/19 0317   BP: 113/63   Pulse: 64   Resp: 15   Temp: 97.3 F (36.3 C)   SpO2: 98%       Intake and Output Summary (Last 24 hours):  I/O last 3 completed shifts:  In: 410 [P.O.:400; I.V.:10]  Out: 0     Labs:     Results     Procedure Component Value Units Date/Time    Hemolysis index [[id number]] Collected: 06/10/19 0321     Updated: 06/10/19 0449     Hemolysis Index 1    GFR [[id number]] Collected: 06/10/19 0321     Updated: 06/10/19  0449     EGFR 7.8    Basic Metabolic Panel [[id number]]  (Abnormal) Collected: 06/10/19 0321    Specimen: Blood Updated: 06/10/19 0449     Glucose 288 mg/dL      BUN 44.0 mg/dL      Creatinine 6.5 mg/dL      Calcium 7.9 mg/dL      Sodium 133 mEq/L      Potassium 4.1 mEq/L      Chloride 99 mEq/L      CO2 23 mEq/L      Anion Gap 11.0    CBC without differential [[id number]]  (Abnormal) Collected: 06/10/19 0321    Specimen: Blood Updated: 06/10/19 0356     WBC 7.40 x10 3/uL      Hgb 7.7 g/dL      Hematocrit 26.6 %      Platelets 157 x10 3/uL      RBC 2.76 x10 6/uL      MCV 96.4 fL      MCH 27.9 pg      MCHC 28.9 g/dL      RDW 13 %  MPV 11.6 fL      Nucleated RBC 0.0 /100 WBC      Absolute NRBC 0.00 x10 3/uL     Glucose Whole Blood - POCT [[id number]]  (Abnormal) Collected: 06/09/19 2051     Updated: 06/09/19 2056     Whole Blood Glucose POCT 266 mg/dL     Glucose Whole Blood - POCT [[id number]]  (Abnormal) Collected: 06/09/19 1635     Updated: 06/09/19 1641     Whole Blood Glucose POCT 248 mg/dL     Culture + Gram Stain,Aerobic, Wound [[id number]] Collected: 06/09/19 1119    Specimen: Wound Updated: 06/09/19 1627    Narrative:      ORDER#: [id number]                                    ORDERED BY: Gennaro Africa  SOURCE: Wound Abdominal wound                        COLLECTED:  06/09/19 11:19  ANTIBIOTICS AT COLL.:                                RECEIVED :  06/09/19 15:00  Stain, Gram                                FINAL       06/09/19 16:27  06/09/19   No Squamous epithelial cells seen             Few WBCs             No organisms seen  Culture and Gram Stain, Aerobic, Wound     PENDING      Glucose Whole Blood - POCT [[id number]]  (Abnormal) Collected: 06/09/19 1248     Updated: 06/09/19 1303     Whole Blood Glucose POCT 217 mg/dL           Rads:     Radiology Results (24 Hour)     ** No results found for the last 24 hours. **            Medications:     Current Facility-Administered Medications   Medication Dose Route  Frequency    acetaminophen  1,000 mg Oral 4 times per day    amLODIPine  5 mg Oral Q12H    atorvastatin  40 mg Oral Daily    carvedilol  25 mg Oral Q12H SCH    darbepoetin alfa  60 mcg Subcutaneous Weekly    gabapentin  300 mg Oral Daily    heparin (porcine)  5,000 Units Subcutaneous Q12H [place]    insulin glargine  5 Units Subcutaneous Q12H    insulin lispro  1-8 Units Subcutaneous TID AC    lactobacillus/streptococcus  1 capsule Oral Daily    neomycin-bacitracin-polymyxin   Topical BID    pantoprazole  40 mg Oral QAM AC    piperacillin-tazobactam  2.25 g Intravenous Q8H    senna-docusate  1 tablet Oral BID    sevelamer  800 mg Oral TID MEALS    vancomycin  125 mg Oral Q6H    zinc Oxide   Topical Q6H      sodium chloride 20 mL/hr at 05/27/19 1100    sodium chloride 15 mL/hr at 06/02/19  2057    lactated ringers       sodium chloride, bisacodyl, Nursing communication: Adult Hypoglycemia Treatment Algorithm **AND** dextrose **AND** dextrose **AND** glucagon (rDNA), Nursing communication: Adult Hypoglycemia Treatment Algorithm **AND** dextrose **AND** dextrose **AND** glucagon (rDNA), diphenhydrAMINE, guaiFENesin-dextromethorphan, iohexol, naloxone, ondansetron, ondansetron **OR** ondansetron, oxyCODONE **OR** oxyCODONE, petrolatum     Physical Exam:     General appearance - alert, well appearing, and in no distress  Mental status - alert, oriented to person, place, and time  Chest - non-labored  Heart - S1 and S2 normal  Abdomen - soft, tender only just lateral to the wound vac. Non-distended.  Wound - wound vac in place to suction, scant s/s output  Extremities - no pedal edema noted, intact peripheral pulses    Signed by: Sharon Seller

## 2019-06-10 NOTE — Progress Notes (Signed)
HD complete and tolerated well. 2 liters net fluid removed. R CVC dressing changed per protocol. Pt AOX4 denied any pain or SOB. Report given to the primary nurse Jude Jake Seats RN.         06/10/19 1330   Treatment Summary   Time Off Machine 1320   Duration of Treatment (Hours) 3.5   Dialyzer Clearance Heavily streaked   Fluid Volume Off (mL) 2400   Prime Volume (mL) 200   Rinseback Volume (mL) 200   Fluid Given: Normal Saline (mL) 0   Fluid Given: PRBC  0 mL   Fluid Given: Albumin (mL) 0   Fluid Given: Other (mL) 0   Total Fluid Given 400   Hemodialysis Net Fluid Removed 2000   Post Treatment Assessment   Post-Treatment Weight (Kg) -2   Patient Response to Treatment tolerated   Additional Dialyzer Used 0   Permacath/Temporary Catheter 05/23/19 Permacath Right   Placement Date/Time: 05/23/19 1342   Inserted by: Papadouris  Access Type: Permacath  Orientation: Right  Access Location: Tunneled, Chest  Catheter Tip Location: svc  Central Line Infection Prevention Education provided?: Yes;No  Hand Hygiene: [place]...   Catheter Lumen Volume Venous 1.7 mL   Catheter Lumen Volume Arterial 1.7 mL   Dressing Status and Intervention Dressing Intact;Clean & Dry   Tego Caps on Catheter Yes   NEW Tego Caps placed (Date) 06/10/19   Vitals   Temp 97.5 F (36.4 C)   Heart Rate (!) 58   Resp Rate 18   BP 122/58   SpO2 99 %   O2 Device None (Room air)   Pain Assessment   Charting Type Reassessment   Pain Scale Used Numeric Scale (0-10)   Numeric Pain Scale   Pain Score 0   POSS Score 1   Education   Person taught Patient   Knowledge basis Minimal   Topics taught Access care;Procedure   Teaching Tools Explain   Reponse Needs Reinforcement   Bedside Nurse Communication   Name of bedside RN - post dialysis Jude Jake Seats RN

## 2019-06-10 NOTE — Plan of Care (Signed)
Problem: Renal Instability  Goal: Fluid and electrolyte balance are achieved/maintained  Outcome: Progressing  Flowsheets (Taken 06/07/2019 1736 by Mickel Baas, RN)  Fluid and electrolyte balance are achieved/maintained:   Monitor/assess lab values and report abnormal values   Assess and reassess fluid and electrolyte status   Monitor daily weight   Follow fluid restrictions/IV/PO parameters  Goal: Free from infection  Outcome: Progressing  Flowsheets (Taken 06/10/2019 1015)  Free from infection: Monitor/assess for signs and symptoms of infection     Problem: Patient Receiving Advanced Renal Therapies  Goal: Therapy access site remains intact  Outcome: Progressing  Flowsheets (Taken 06/10/2019 0617 by Trevor Iha, RN)  Therapy access site remains intact: Assess therapy access site

## 2019-06-10 NOTE — Progress Notes (Signed)
[facility] PROGRESS NOTE  Aaron Edelman, x [phone] [facility])      Date Time: 06/10/19 2:08 PM  Patient Name: [name]  Attending Physician: Delfina Redwood, MD    CC: follow-up ESRD    Assessment:     1. ESRD- previously on PD; switched to HD on  5/3   2. PD associated peritonitis - growing Pseudomonas and E. Coli, antibiotics change to zosyn --> po levoquin  - S/p PD catheter removal on 05/27/19  3. 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 Secondary hyperparathyroidism     Recommendations:   1. S/p HD today, 2L net UF  2. S/p wound vac placement  3. Stable from renal standpoint      Case discussed with: pt    Verl Blalock, MD  [facility]  [phone] (office)  X [phone] [facility] [employer])    Subjective: tolerated HD well, hemodynamically stable    Review of Systems:   Cardio:no chest pain  Pulm:no SOB  IP:[id number] at incision site    Physical Exam:     Vitals:    06/10/19 1315 06/10/19 1320 06/10/19 1330 06/10/19 1341   BP: 113/57 113/57 122/58 111/72   Pulse: (!) 57 (!) 57 (!) 58 (!) 59   Resp: 18 18 18 16    Temp:   97.5 F (36.4 C) 97.7 F (36.5 C)   TempSrc:    Oral   SpO2: 99% 99% 99% 99%   Weight:       Height:           Intake and Output Summary (Last 24 hours) at Date Time    Intake/Output Summary (Last 24 hours) at 06/10/2019 1408  Last data filed at 06/10/2019 1330  Gross per 24 hour   Intake 850 ml   Output 2000 ml   Net -1150 ml       General: awake, alert, oriented x 3, no acute distress.  HEENT: sclera anicteric  Neck: supple  Heart: RRR  Lungs: clear to auscultation bilaterally, bilateral air entry, normal work of breathing  Abd: wound vac in place, tender to palpation  Extremities: improved LE edema  Neuro: A+O x 3, no gross motor/sensory deficits    Access R HDTC    Meds:      Scheduled Meds: PRN Meds:    acetaminophen, 1,000 mg, Oral, 4 times per day  amLODIPine, 5 mg, Oral, Q12H  atorvastatin, 40 mg, Oral, Daily  carvedilol, 25 mg,  Oral, Q12H SCH  darbepoetin alfa, 60 mcg, Subcutaneous, Weekly  gabapentin, 300 mg, Oral, Daily  heparin (porcine), 5,000 Units, Subcutaneous, Q12H SCH  insulin glargine, 5 Units, Subcutaneous, Q12H  insulin lispro, 1-8 Units, Subcutaneous, TID AC  lactobacillus/streptococcus, 1 capsule, Oral, Daily  neomycin-bacitracin-polymyxin, , Topical, BID  pantoprazole, 40 mg, Oral, QAM AC  piperacillin-tazobactam, 2.25 g, Intravenous, Q8H  senna-docusate, 1 tablet, Oral, BID  sevelamer, 800 mg, Oral, TID MEALS  vancomycin, 125 mg, Oral, Q6H  zinc Oxide, , Topical, Q6H          Continuous Infusions:   sodium chloride 20 mL/hr at 05/27/19 1100    sodium chloride 15 mL/hr at 06/02/19 2057    lactated ringers      sodium chloride, , PRN  albumin human, 100 mL, PRN  bisacodyl, 10 mg, Daily PRN  dextrose, 15 g of glucose, PRN   And  dextrose, 12.5 g, PRN   And  glucagon (rDNA), 1 mg, PRN  dextrose, 15 g of glucose, PRN   And  dextrose, 12.5 g, PRN   And  glucagon (rDNA), 1 mg, PRN  diphenhydrAMINE, 25 mg, Q6H PRN  guaiFENesin-dextromethorphan, 5 mL, Q4H PRN  iohexol, 1,000 mL, ONCE PRN  naloxone, 0.2 mg, PRN  ondansetron, 4 mg, Once PRN  ondansetron, 4 mg, Q6H PRN   Or  ondansetron, 4 mg, Q6H PRN  oxyCODONE, 5 mg, Q6H PRN   Or  oxyCODONE, 10 mg, Q6H PRN  petrolatum, , PRN  sodium chloride, 100 mL, Q1H PRN  sodium chloride, 250 mL, PRN              Labs:     Recent Labs   Lab 06/10/19  0321 06/08/19  0339 06/06/19  0332   WBC 7.40 7.77 9.03   Hgb 7.7* 8.0* 8.1*   Hematocrit 26.6* 27.6* 28.3*   Platelets 157 145 167     Recent Labs   Lab 06/10/19  0321 06/08/19  0339 06/06/19  0332   Sodium 133* 136 137   Potassium 4.1 4.1 4.2   Chloride 99* 99* 100   CO2 23 25 27    BUN 44.0* 24.0* 22.0*   Creatinine 6.5* 4.1* 4.4*   Calcium 7.9* 7.9* 7.7*   Glucose 288* 216* 255*   EGFR 7.8 13.2 12.2         Signed by: Verl Blalock, MD

## 2019-06-10 NOTE — Plan of Care (Signed)
Pt A&O x 4, VSS, had HD this am. Reporting pain to abd which is controlled with po meds. Wound vac in place. Pt on IV abx. Will continue to monitor for changes.    Problem: Safety  Goal: Patient will be free from injury during hospitalization  Outcome: Progressing  Flowsheets (Taken 06/10/2019 1732)  Patient will be free from injury during hospitalization:   Assess patient's risk for falls and implement fall prevention plan of care per policy   Provide and maintain safe environment   Hourly rounding     Problem: Pain  Goal: Pain at adequate level as identified by patient  Outcome: Progressing  Flowsheets (Taken 06/10/2019 1732)  Pain at adequate level as identified by patient:   Identify patient comfort function goal   Assess for risk of opioid induced respiratory depression, including snoring/sleep apnea. Alert healthcare team of risk factors identified.   Assess pain on admission, during daily assessment and/or before any "as needed" intervention(s)   Reassess pain within 30-60 minutes of any procedure/intervention, per Pain Assessment, Intervention, Reassessment (AIR) Cycle   Evaluate if patient comfort function goal is met   Offer non-pharmacological pain management interventions   Evaluate patient's satisfaction with pain management progress     Problem: Renal Instability  Goal: Fluid and electrolyte balance are achieved/maintained  Outcome: Progressing  Flowsheets (Taken 06/10/2019 1732)  Fluid and electrolyte balance are achieved/maintained:   Monitor/assess lab values and report abnormal values   Assess and reassess fluid and electrolyte status   Monitor intake and output every shift  Note: HD this shift.

## 2019-06-11 LAB — GLUCOSE WHOLE BLOOD - POCT
Whole Blood Glucose POCT: 260 mg/dL — ABNORMAL HIGH (ref 70–100)
Whole Blood Glucose POCT: 168 mg/dL — ABNORMAL HIGH (ref 70–100)
Whole Blood Glucose POCT: 246 mg/dL — ABNORMAL HIGH (ref 70–100)
Whole Blood Glucose POCT: 179 mg/dL — ABNORMAL HIGH (ref 70–100)

## 2019-06-11 NOTE — Plan of Care (Signed)
Problem: Safety  Goal: Patient will be free from injury during hospitalization  Outcome: Progressing  Flowsheets (Taken 06/11/2019 2032)  Patient will be free from injury during hospitalization:   Assess patient's risk for falls and implement fall prevention plan of care per policy   Provide and maintain safe environment   Use appropriate transfer methods   Hourly rounding   Provide alternative method of communication if needed (communication boards, writing)  Goal: Patient will be free from infection during hospitalization  Outcome: Progressing  Flowsheets (Taken 06/11/2019 2032)  Free from Infection during hospitalization:   Assess and monitor for signs and symptoms of infection   Monitor lab/diagnostic results   Monitor all insertion sites (i.e. indwelling lines, tubes, urinary catheters, and drains)   Encourage patient and family to use good hand hygiene technique     Problem: Pain  Goal: Pain at adequate level as identified by patient  Outcome: Progressing  Flowsheets (Taken 06/11/2019 2032)  Pain at adequate level as identified by patient:   Identify patient comfort function goal   Evaluate patient's satisfaction with pain management progress   Evaluate if patient comfort function goal is met   Reassess pain within 30-60 minutes of any procedure/intervention, per Pain Assessment, Intervention, Reassessment (AIR) Cycle   Consult/collaborate with Physical Therapy, Occupational Therapy, and/or Speech Therapy   Offer non-pharmacological pain management interventions     Problem: Altered GI Function  Goal: Fluid and electrolyte balance are achieved/maintained  Outcome: Progressing  Flowsheets (Taken 06/11/2019 2032)  Fluid and electrolyte balance are achieved/maintained:   Monitor intake and output every shift   Monitor/assess lab values and report abnormal values   Provide adequate hydration   Monitor daily weight   Assess for confusion/personality changes   Assess and reassess fluid and electrolyte status   Monitor  for muscle weakness  Goal: Elimination patterns are normal or improving  Outcome: Progressing  Flowsheets (Taken 06/11/2019 2032)  Elimination patterns are normal or improving:   Monitor for abdominal distension   Monitor for abdominal discomfort   Assess for signs and symptoms of bleeding.  Report signs of bleeding to physician   Administer treatments as ordered   Assess for flatus   Assess for normal bowel sounds   Anticipate/assist with toileting needs  Goal: Nutritional intake is adequate  Outcome: Progressing  Flowsheets (Taken 06/11/2019 2032)  Nutritional intake is adequate:   Monitor daily weights   Allow adequate time for meals   Assist patient with meals/food selection   Consult/collaborate with Clinical Nutritionist   Include patient/patient care companion in decisions related to nutrition  Goal: Mobility/Activity is maintained at optimal level for patient  Outcome: Progressing  Flowsheets (Taken 06/11/2019 2032)  Mobility/activity is maintained at optimal level for patient:   Increase mobility as tolerated/progressive mobility   Perform active/passive ROM   Maintain proper body alignment   Encourage independent activity per ability   Reposition patient every 2 hours and as needed unless able to reposition self     Problem: Compromised Tissue integrity  Goal: Damaged tissue is healing and protected  Outcome: Progressing  Flowsheets (Taken 06/11/2019 2032)  Damaged tissue is healing and protected:   Monitor/assess Braden scale every shift   Keep intact skin clean and dry   Avoid shearing injuries   Use bath wipes, not soap and water, for daily bathing   Reposition patient every 2 hours and as needed unless able to reposition self   Provide wound care per wound care algorithm  Monitor patient's hygiene practices   Encourage use of lotion/moisturizer on skin     Problem: Renal Instability  Goal: Fluid and electrolyte balance are achieved/maintained  Outcome: Progressing  Flowsheets (Taken 06/11/2019 2032)  Fluid  and electrolyte balance are achieved/maintained:   Monitor intake and output every shift   Monitor/assess lab values and report abnormal values   Monitor daily weight   Provide adequate hydration   Assess for confusion/personality changes   Assess and reassess fluid and electrolyte status   Observe for seizure activity and initiate seizure precautions if indicated    Patient Aox4. On Room air. VSS. Scheduled meds were given. Pt able to ambulate to the bathroom with standby assist. BM noted during the shift. Wound vac removed today. Hemodialysis tomorrow. Call bell and personal belongings are within reach. Safety precautions maintained.

## 2019-06-11 NOTE — Progress Notes (Signed)
Wound Ostomy Continence Progress Note    Date Time: 06/11/19 6:26 PM  Patient Name: [name], [name]  Consulting Service: WOCN    Assessment   Assessment   Specialty Bed:   [place]  Braden Score: Braden Scale Score: 20 (06/11/19 0820)  BMI: Body mass index is 47.98 kg/m.  DIET: Glucerna Supplement Quantity: A. One; Flavor: Strawberry; Frequency: Daily with lunch  Diet consistent carbohydrate and renal Protein restriction: 80 GM Protein  Supervise For Meals Frequency: All meals    Patient was assessed in room accompanied by husband and Infectious Disease physician. Patient complained of abdominal pain at and near the incision site. She had a novocain patch in place above the incision site. Wound vac was removed including black foam and white foam. No purulence or malodor noted. No necrotic tissue. Small amount of blood in the NPWT system (about 20 mls).   Patient has a surgical incision on their abdomen.  It measures  7x5x3 cm.  The wound bed is significant yellow subcutaneous fat, muscle/closed fascia at the base (not well visualized).  There is no tunneling or undermining. There is small serosanguinous drainage. The periwound is intact, slightly macerated.     The NPWT was stopped and wound was cleansed with wound cleanser. Hydrogel applied to wound bed, and Maxorb AG was packed lightly into wound. Wound was covered with adhesive foam dressing. Patient said the wound was minimally painful after procedure, but patient had been medicated prior to arrival.    Media Information     Document Information    Photographs   Abd 7x5x3   06/11/2019 18:05   Attached To:   Hospital Encounter on 05/27/19   Source Information    Hanley Hays, RN   [facility]  Wound care to abdomen three times weekly - MWF:   1. Cleanse wound with wound cleanser.   2. Apply hydrogel to the wound bed.   3. Maxorb Extra AG (calcium alginate) wound dressing with silver - cut to fit wound.   4. Cover with  Adhesive foam dressing or ABD pad and tape to secure.     Maxorb AG dressing helps protect peri-wound skin and reduce maceration. It has absorptive properties that form a cohesive gel when in contact with exudates that provides rapid and sustained antimicrobial activity with ionic silver.        Thank you for this consultation,    Hanley Hays, BSN, RN, [facility]  Wound, Ostomy, and Continence Nurse Coordinator  [facility]  [address] [place], [place] [address]  T [phone] Jerilynn Mages [phone]  [email or web address]

## 2019-06-11 NOTE — Progress Notes (Signed)
Infectious diseases addendum    Wound: There is no purulent drainage.  No surrounding cellulitis.  Wound is closing.  Pain is unchanged with the VAC out.  Await surgical plans for closure

## 2019-06-11 NOTE — Plan of Care (Addendum)
Patient AxOx4. Complaints of mild/moderate L abd pain made tolerable w/ acetaminophen, oxycodone, and lidocaine patch. Wound vac in place and draining serosanguinous fluid. Wound vac dressing clean, dry, and intact. VS stable. Pt ambulatory w/ walker and 1 person assist. IV and p.o. abx given. PM medications tolerated well. Safety measures maintained. Hourly rounding completed. Call bell within reach.    Problem: Moderate/High Fall Risk Score >5  Goal: Patient will remain free of falls  Outcome: Progressing  Flowsheets (Taken 06/10/2019 2000)  Moderate Risk (6-13):   MOD-Consider activation of bed alarm if appropriate   MOD-Apply bed exit alarm if patient is confused   MOD-Floor mat at bedside (where available) if appropriate   MOD-Remain with patient during toileting   MOD-Perform dangle, stand, walk (DSW) when getting patient up     Problem: Safety  Goal: Patient will be free from injury during hospitalization  Outcome: Progressing  Flowsheets (Taken 06/11/2019 0806)  Patient will be free from injury during hospitalization:   Assess patient's risk for falls and implement fall prevention plan of care per policy   Provide and maintain safe environment   Use appropriate transfer methods   Ensure appropriate safety devices are available at the bedside   Include patient/ family/ care giver in decisions related to safety   Hourly rounding  Goal: Patient will be free from infection during hospitalization  Outcome: Progressing  Flowsheets (Taken 06/11/2019 0806)  Free from Infection during hospitalization:   Assess and monitor for signs and symptoms of infection   Monitor lab/diagnostic results   Encourage patient and family to use good hand hygiene technique   Monitor all insertion sites (i.e. indwelling lines, tubes, urinary catheters, and drains)     Problem: Pain  Goal: Pain at adequate level as identified by patient  Outcome: Progressing  Flowsheets (Taken 06/11/2019 0806)  Pain at adequate level as  identified by patient:   Identify patient comfort function goal   Assess for risk of opioid induced respiratory depression, including snoring/sleep apnea. Alert healthcare team of risk factors identified.   Assess pain on admission, during daily assessment and/or before any "as needed" intervention(s)   Reassess pain within 30-60 minutes of any procedure/intervention, per Pain Assessment, Intervention, Reassessment (AIR) Cycle   Evaluate if patient comfort function goal is met   Evaluate patient's satisfaction with pain management progress   Offer non-pharmacological pain management interventions     Problem: Side Effects from Pain Analgesia  Goal: Patient will experience minimal side effects of analgesic therapy  Outcome: Progressing  Flowsheets (Taken 06/11/2019 0806)  Patient will experience minimal side effects of analgesic therapy:   Monitor/assess patient's respiratory status (RR depth, effort, breath sounds)   Assess for changes in cognitive function     Problem: Discharge Barriers  Goal: Patient will be discharged home or other facility with appropriate resources  Outcome: Progressing  Flowsheets (Taken 06/11/2019 0806)  Discharge to home or other facility with appropriate resources:   Provide appropriate patient education   Provide information on available health resources     Problem: Psychosocial and Spiritual Needs  Goal: Demonstrates ability to cope with hospitalization/illness  Outcome: Progressing  Flowsheets (Taken 06/11/2019 0806)  Demonstrates ability to cope with hospitalizations/illness:   Encourage verbalization of feelings/concerns/expectations   Provide quiet environment   Assist patient to identify own strengths and abilities   Encourage patient to set small goals for self   Encourage participation in diversional activity   Reinforce positive adaptation of new coping behaviors  Include patient/ patient care companion in decisions     Problem: Altered GI Function  Goal: Fluid  and electrolyte balance are achieved/maintained  Outcome: Progressing  Flowsheets (Taken 06/11/2019 0806)  Fluid and electrolyte balance are achieved/maintained:   Monitor intake and output every shift   Monitor/assess lab values and report abnormal values   Assess for confusion/personality changes   Monitor daily weight   Assess and reassess fluid and electrolyte status   Observe for seizure activity and initiate seizure precautions if indicated   Observe for cardiac arrhythmias   Monitor for muscle weakness  Goal: Elimination patterns are normal or improving  Outcome: Progressing  Flowsheets (Taken 06/11/2019 0806)  Elimination patterns are normal or improving:   Report abnormal assessment to physician   Anticipate/assist with toileting needs   Assess for normal bowel sounds   Monitor for abdominal distension   Monitor for abdominal discomfort   Assess for signs and symptoms of bleeding.  Report signs of bleeding to physician   Administer treatments as ordered   Assess for flatus  Goal: Nutritional intake is adequate  Outcome: Progressing  Flowsheets (Taken 06/11/2019 0806)  Nutritional intake is adequate:   Monitor daily weights   Assist patient with meals/food selection   Allow adequate time for meals   Encourage/perform oral hygiene as appropriate   Encourage/administer dietary supplements as ordered (i.e. tube feed, TPN, oral, OGT/NGT, supplements)  Goal: Mobility/Activity is maintained at optimal level for patient  Outcome: Progressing  Flowsheets (Taken 06/11/2019 0806)  Mobility/activity is maintained at optimal level for patient:   Increase mobility as tolerated/progressive mobility   Encourage independent activity per ability   Maintain proper body alignment   Perform active/passive ROM   Plan activities to conserve energy, plan rest periods   Reposition patient every 2 hours and as needed unless able to reposition self   Assess for changes in respiratory status, level of  consciousness and/or development of fatigue  Goal: No bleeding  Outcome: Progressing  Flowsheets (Taken 06/11/2019 0806)  No bleeding:   Monitor and assess vitals and hemodynamic parameters   Monitor/assess lab values and report abnormal values   Assess for bruising/petechia     Problem: Compromised Tissue integrity  Goal: Damaged tissue is healing and protected  Outcome: Progressing  Flowsheets (Taken 06/11/2019 0806)  Damaged tissue is healing and protected:   Monitor/assess Braden scale every shift   Provide wound care per wound care algorithm   Reposition patient every 2 hours and as needed unless able to reposition self   Increase activity as tolerated/progressive mobility   Relieve pressure to bony prominences for patients at moderate and high risk   Keep intact skin clean and dry   Use bath wipes, not soap and water, for daily bathing   Use incontinence wipes for cleaning urine, stool and caustic drainage. Foley care as needed   Monitor external devices/tubes for correct placement to prevent pressure, friction and shearing  Goal: Nutritional status is improving  Outcome: Progressing  Flowsheets (Taken 06/11/2019 0806)  Nutritional status is improving:   Assist patient with eating   Allow adequate time for meals   Encourage patient to take dietary supplement(s) as ordered   Include patient/patient care companion in decisions related to nutrition     Problem: Renal Instability  Goal: Fluid and electrolyte balance are achieved/maintained  Outcome: Progressing  Flowsheets (Taken 06/11/2019 0806)  Fluid and electrolyte balance are achieved/maintained:   Monitor intake and output every shift   Monitor/assess lab  values and report abnormal values   Provide adequate hydration   Monitor daily weight   Assess for confusion/personality changes   Assess and reassess fluid and electrolyte status   Observe for seizure activity and initiate seizure precautions if indicated  Goal: Free from  infection  Outcome: Progressing  Flowsheets (Taken 06/11/2019 0806)  Free from infection: Monitor/assess for signs and symptoms of infection     Problem: Patient Receiving Advanced Renal Therapies  Goal: Therapy access site remains intact  Outcome: Progressing  Flowsheets (Taken 06/11/2019 0806)  Therapy access site remains intact:   Assess therapy access site   Change therapy access site dressing as needed

## 2019-06-11 NOTE — Progress Notes (Signed)
[facility] PROGRESS NOTE  Aaron Edelman, x [phone] [facility] Spectralink)      Date Time: 06/11/19 4:58 PM  Patient Name: [name]  Attending Physician: Delfina Redwood, MD    CC: follow-up ESRD    Assessment:     1. ESRD- previously on PD; switched to HD on  5/3   2. 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 Secondary hyperparathyroidism   5. HTN- stable    Recommendations:     1. HD in am an TTS  2. Labs with HD  3. Stable from a renal standpoint for d/c. OP HD at [facility]      Case discussed with: pt    Ulyses Amor, MD  [facility]  [phone] (office)  X [phone] ([place])    Subjective: Feels alright; some pain over surgical site LLQ    Review of Systems:   Cardio:no chest pain  Pulm:no SOB  ER:[id number] at incision site    Physical Exam:     Vitals:    06/11/19 0835 06/11/19 1130 06/11/19 1312 06/11/19 1504   BP: 114/64 122/72 130/86 140/70   Pulse: 64 65  69   Resp:  18  19   Temp:  97.5 F (36.4 C)  97.7 F (36.5 C)   TempSrc:  Oral  Oral   SpO2:  98%  99%   Weight:       Height:           Intake and Output Summary (Last 24 hours) at Date Time    Intake/Output Summary (Last 24 hours) at 06/11/2019 1658  Last data filed at 06/11/2019 1000  Gross per 24 hour   Intake 820 ml   Output 550 ml   Net 270 ml       General: awake, alert, oriented x 3, no acute distress.  HEENT: sclera anicteric  Neck: supple  Heart: RRR  Lungs: clear to auscultation bilaterally, bilateral air entry, normal work of breathing  Abd: wound vac in place, tender to palpation  Extremities: improved LE edema  Neuro: A+O x 3, no gross motor/sensory deficits    Access R HDTC    Meds:      Scheduled Meds: PRN Meds:    acetaminophen, 1,000 mg, Oral, 4 times per day  amLODIPine, 5 mg, Oral, Q12H  atorvastatin, 40 mg, Oral, Daily  carvedilol, 25 mg, Oral, Q12H  SCH  darbepoetin alfa, 60 mcg, Subcutaneous, Weekly  gabapentin, 300 mg, Oral, Daily  heparin (porcine), 5,000 Units, Subcutaneous, Q12H [facility]  insulin glargine, 5 Units, Subcutaneous, Q12H  insulin lispro, 1-8 Units, Subcutaneous, TID AC  lactobacillus/streptococcus, 1 capsule, Oral, Daily  lidocaine, 1 patch, Transdermal, Q24H  neomycin-bacitracin-polymyxin, , Topical, BID  pantoprazole, 40 mg, Oral, QAM AC  senna-docusate, 1 tablet, Oral, BID  sevelamer, 800 mg, Oral, TID MEALS  zinc Oxide, , Topical, Q6H          Continuous Infusions:   sodium chloride 20 mL/hr at 05/27/19 1100    sodium chloride 15 mL/hr at 06/02/19 2057    lactated ringers      sodium chloride, , PRN  bisacodyl, 10 mg, Daily PRN  dextrose, 15 g of glucose, PRN   And  dextrose, 12.5 g, PRN   And  glucagon (rDNA), 1 mg, PRN  dextrose, 15 g of glucose,  PRN   And  dextrose, 12.5 g, PRN   And  glucagon (rDNA), 1 mg, PRN  diphenhydrAMINE, 25 mg, Q6H PRN  guaiFENesin-dextromethorphan, 5 mL, Q4H PRN  iohexol, 1,000 mL, ONCE PRN  naloxone, 0.2 mg, PRN  ondansetron, 4 mg, Once PRN  ondansetron, 4 mg, Q6H PRN   Or  ondansetron, 4 mg, Q6H PRN  oxyCODONE, 5 mg, Q6H PRN   Or  oxyCODONE, 10 mg, Q6H PRN  petrolatum, , PRN              Labs:     Recent Labs   Lab 06/10/19  0321 06/08/19  0339 06/06/19  0332   WBC 7.40 7.77 9.03   Hgb 7.7* 8.0* 8.1*   Hematocrit 26.6* 27.6* 28.3*   Platelets 157 145 167     Recent Labs   Lab 06/10/19  0321 06/08/19  0339 06/06/19  0332   Sodium 133* 136 137   Potassium 4.1 4.1 4.2   Chloride 99* 99* 100   CO2 23 25 27    BUN 44.0* 24.0* 22.0*   Creatinine 6.5* 4.1* 4.4*   Calcium 7.9* 7.9* 7.7*   Glucose 288* 216* 255*   EGFR 7.8 13.2 12.2         Signed by: Ulyses Amor, MD

## 2019-06-11 NOTE — UM Notes (Signed)
06/11/2019    MEDICAID HMO/[employer] [place]  05/29/19 1110  Admit to Inpatient Once            HOSPITALIST PROGRESS NOTE      Patient: [name]  Date: 06/10/2019    LOS: 12 Days  Admission Date: 05/27/2019   MRN: [id number]  Attending: Delfina Redwood  MD, MPH                    Please contact via Secure Chat                 ASSESSMENT/PLAN     [name] is a 64 y.o. female admitted with Generalized abdominal pain    Interval Summary:     Active Hospital Problems    Diagnosis    Bowel incontinence    Thrombocytopenia    Hypotension    Type 2 diabetes mellitus, with long-term current use of insulin    GERD (gastroesophageal reflux disease)    Generalized abdominal pain    ESRD (end stage renal disease) on dialysis    Peritonitis    Peritoneal dialysis catheter dysfunction, initial encounter       Presentation History  Per H&P "[name] McKethanis a 64 y.o.femalewith a PMHx of hypertension, hyperlipidemia, insulin-dependent diabetes mellitus, COPD, end-stage renal disease on peritoneal dialysis  Recently switched to hemodialysis and discharged yesterdayreturnto the ED with recurrent abdominal pain associated with Non bloodydiarrhea, nausea vomiting of nonbilious and nonbloody vomitus. Patient states that she continued to have loose watery stools on arriving at home yesterday followed by nausea vomiting 2-3 times, described abdominal pain as left-sided pain intensity greater than 10, pain is worse with palpation. She denies fever/chills, dysuria, chest pain, shortness of breath, blood in stool or vomitus. She had dialysis yesterday prior to discharge and have not arranged transportation for outpatient dialysis yet.    In the emergency room, CT abdomen pelvis showed partial small bowel obstruction, trace bilateral pleural effusions, revealed WBC of 15.22, H&H of 10.4/34.1,stool studies pending, infectious disease Dr. Lamona Curl Surgery Dr.  Myrna Blazer".    Hospital Course (10Days):    Patient was admitted for PD peritonitis. Peritoneal fluid grew Pseudomonas. ID recommended IV Zosyn which was deescalated to Cipro based on susceptibility. ID had recommended continuing antibiotics till 5/19. Given increased abdominal pain zosyn was resumed 5/16.     Patient was also treated prophylactically for C. difficile. P.o. vancomycin was used. ID recommend continue p.o. vancomycin till 5/19. Patient's PD catheter was removed and she was started on hemodialysis.  Outpatient hemodialysis was set up prior to discharge. PT assessed patient and recommended SNF but patient now wants to go home with home PT/OT.     Over this past weekend  patient developed increasing surgical site pain.  Continues to have persistent erythema and exquisite tenderness over certain areas of the surgical wound.  No discharge was noted.  On evaluation with CAT scan on 5/16 she was found to have evidence of surgical site infection with possible air at this time.  Bedside debridement revealed 5 cc of serous vs purulent collection.  After debridement a wound VAC was applied. No wound dehiscence noted.    Patient Active Hospital Problem List:  Generalized abdominal pain (05/27/2019)  Peritoneal dialysis catheter dysfunction, initial encounter (05/12/2019)  Peritonitis (05/24/2019)  Assessment: PD catheter removed by general surgery with exploratory laparotomy on 05/28/2019. Pleural fluid growing Pseudomonas and E. Coli. Wound site complication noted with increased pain  warranting debridement and wound vac placement on 5/17.   Plan:   Follow up wound culture  Discussed with general surgery and infectious disease  Appreciate wound VAC placement by wound nurse  Continue broad-spectrum antibiotic coverage with IV Zosyn  Follow-up on cultures  Oral vancomycin 125 mg every 6 hours has been continued as C. difficile prophylaxis   incentive spirometry use encouraged   Pain regimen as  below.   Wound VAC change tomorrow 5/19    Bowel incontinence   Assessment-unclear if this is a complication of prior back injury; however this problem is chronic and has been going on for the past year  Plan-minimize risk for bedsores with frequent turning and quick response to any accidents    ESRD (end stage renal disease) on dialysis (05/24/2019)  Assessment: Patient was on PD prior to admission; she has been switched to hemodialysis for now; outpatient HD has been arranged for  Plan: Appreciate nephrology inputs   Outpatient dialysis to continue on a Tuesday Thursday Saturday schedule    Thrombocytopenia (05/31/2019)  Assessment: Could be due to the acute infection as above; platelet counts are stable  Plan: Okay to continue heparin for DVT prophylaxis  Monitor daily CBC    Hypotension (05/31/2019)  Assessment: Likely due to the infection above  Plan: Antibiotics as above    Type 2 diabetes mellitus, with long-term current use of insulin (05/31/2019)  Assessment: Chronic and unknown control  Plan: Continue with insulin as above   Diabetic diet     GERD (gastroesophageal reflux disease) (05/31/2019)  Assessment: Chronic and unknown control  Plan: c/w home dose of protonix  Continue maalox      Analgesia: Oxycodone as needed and Tylenol scheduled  Continue with gabapentin 300 mg daily    Nutrition: Cardiac and diabetic    GI Prophylaxis: None    DVT Prophylaxis: Heparin    Code Status: Full code    DISPO: Surgical site wound evaluation on Wednesday to reassess.  Anticipate discharge home with wound VAC versus wet-to-dry dressing based on recommendations of general surgery at that time.        [name] states that her abdominal wound is feeling much better after the placement of wound VAC.  No new concerns today    Temperature: Temp  Min: 97.3 F (36.3 C)  Max: 98.2 F (36.8 C)  Pulse: Pulse  Min: 59  Max: 71  Respiratory: Resp  Min: 15  Max: 18   Non-Invasive BP: BP  Min: 105/66  Max: 161/79  Pulse Oximetry SpO2  Min: 96 %  Max: 98 %    [] Hide copied text    [] Hover for details  INFECTIOUS DISEASES PROGRESS NOTE    Date Time: 06/11/19 9:49 AM  Patient Name: [name],[name]  Patient status.Inpatient  Hospital Day: 13    Assessment and Plan:   1.  PD catheter peritonitis/Pseudomonas  2. No fever or leucocytosis  3. Small collections seen on CT      Plan: 1DC. Zosyn and vanco  2.  We will review wound with wound care team at dressing change    Review of Systems:   Review of Systems -pain around the [facility] sponge  CBC w/Diff CMP         Recent Labs   Lab 06/10/19  0321 06/08/19  0339 06/06/19  0332   WBC 7.40 7.77 9.03   Hgb 7.7* 8.0* 8.1*   Hematocrit 26.6* 27.6* 28.3*  Platelets 157 145 167   MCV 96.4* 97.9* 99.0*       PT/INR               Recent Labs   Lab 06/10/19  0321 06/08/19  0339 06/06/19  0332   Sodium 133* 136 137   Potassium 4.1 4.1 4.2   Chloride 99* 99* 100   CO2 23 25 27    BUN 44.0* 24.0* 22.0*   Creatinine 6.5* 4.1* 4.4*   Glucose 288* 216* 255*   Calcium 7.9* 7.9* 7.7*      Glucose POCT         Recent Labs   Lab 06/10/19  0321 06/08/19  0339 06/06/19  0332   Glucose 288* 216* 255*        Medications  Report  Scheduled    acetaminophen (TYLENOL) tablet 1,000 mg   1,000 mg, PO, Q6H [place]   Last Action:  Given, 1,000 mg at 05/19 1020   amLODIPine (NORVASC) tablet 5 mg   5 mg, PO, Q12H   Last Action:  Given, 5 mg at 05/19 1312   atorvastatin (LIPITOR) tablet 40 mg   40 mg, PO, Daily   Last Action:  Given, 40 mg at 05/19 0834   carvedilol (COREG) tablet 25 mg   25 mg, PO, Q12H [facility]   Last Action:  Given, 25 mg at 05/19 0835   darbepoetin alfa (ARANESP) injection 60 mcg   60 mcg, SC, Weekly   Last Action:  Given, 60 mcg at 05/18 1537   gabapentin (NEURONTIN) capsule 300 mg   300 mg, PO, Daily   Last Action:  Given, 300 mg at 05/19 0835   heparin (porcine) injection 5,000 Units   5,000 Units, SC, Q12H [facility]   Last Action:  Given, 5,000 Units at  05/19 0834   insulin glargine (LANTUS) injection 5 Units (And Linked Group #1)   5 Units, SC, Q12H   Last Action:  Given, 5 Units at 05/19 0840   insulin lispro (HumaLOG) injection 1-8 Units (And Linked Group #2)   1-8 Units, SC, TID AC   Last Action:  Given, 5 Units at 05/19 0841   lactobacillus/streptococcus (RISAQUAD) capsule 1 capsule   1 capsule, PO, Daily   Last Action:  Given, 1 capsule at 05/19 0834   lidocaine (LIDODERM) 5 % 1 patch   1 patch, TD, Q24H   Last Action:  Patch Applied, 1 patch at 05/18 1825   neomycin-bacitracin-polymyxin (NEOSPORIN) ointment   No Dose/Rate, TP, BID   Last Action:  Given, 15 g at 05/17 0919   pantoprazole (PROTONIX) EC tablet 40 mg   40 mg, PO, QAM AC   Last Action:  Given, 40 mg at 05/19 0835   senna-docusate (PERICOLACE) 8.6-50 MG per tablet 1 tablet   1 tablet, PO, BID   Last Action:  Ordered   sevelamer (RENVELA) tablet 800 mg   800 mg, PO, TID MEALS   Last Action:  Given, 800 mg at 05/19 1312   zinc Oxide (DESITIN) 40 % paste   No Dose/Rate, TP, Q6H   Last Action:  Given, No Dose/Rate at 05/19 1312   Continuous    0.9% NaCl infusion   15 mL/hr, IV, Continuous   Last Action:  New Bag, 15 mL/hr at 05/10 2057   0.9% NaCl infusion   20 mL/hr, IV, Continuous   Last Action:  New Bag, 20 mL/hr at 05/04 1100   lactated ringers infusion  20 mL/hr, IV, Continuous   Last Action:  Ordered   PRN    0.9% NaCl infusion   20 mL/hr, IV, PRN   Last Action:  Ordered   bisacodyl (DULCOLAX) suppository 10 mg   10 mg, RE, Daily PRN   Last Action:  Ordered   dextrose (GLUCOSE) 40 % oral gel 15 g of glucose (And Linked Group #3)   15 g of glucose, PO, PRN   Last Action:  Ordered   dextrose (GLUCOSE) 40 % oral gel 15 g of glucose (And Linked Group #4)   15 g of glucose, PO, PRN   Last Action:  Ordered   dextrose 50 % bolus 12.5 g (And Linked Group #3)   12.5 g, IV, PRN   Last Action:  Ordered   dextrose 50 % bolus 12.5 g (And Linked Group #4)   12.5 g, IV, PRN   Last Action:  Ordered    diphenhydrAMINE (BENADRYL) capsule 25 mg   25 mg, PO, Q6H PRN   Last Action:  Given, 25 mg at 05/07 0937   glucagon (rDNA) (GLUCAGEN) injection 1 mg (And Linked Group #3)   1 mg, IM, PRN   Last Action:  Ordered   glucagon (rDNA) (GLUCAGEN) injection 1 mg (And Linked Group #4)   1 mg, IM, PRN   Last Action:  Ordered   guaiFENesin-dextromethorphan (ROBITUSSIN DM) 100-10 MG/5ML syrup 5 mL   5 mL, PO, Q4H PRN   Last Action:  Given, 5 mL at 05/09 2126   iohexol (OMNIPAQUE) 12 mg/mL oral solution (premix) 1,000 mL   1,000 mL, PO, ONCE PRN   Last Action:  Ordered   naloxone (NARCAN) injection 0.2 mg   0.2 mg, IV, PRN   Last Action:  Ordered   ondansetron (ZOFRAN) injection 4 mg   4 mg, IV, Once PRN   Last Action:  Ordered   ondansetron (ZOFRAN) injection 4 mg (Or Linked Group #5)   4 mg, IV, Q6H PRN   Last Action:  Ordered   ondansetron (ZOFRAN-ODT) disintegrating tablet 4 mg (Or Linked Group #5)   4 mg, PO, Q6H PRN   Last Action:  See Alternative, 05/05 1245   oxyCODONE (ROXICODONE) immediate release tablet 10 mg (Or Linked Group #6)   10 mg, PO, Q6H PRN   Last Action:  See Alternative, 05/19 0644   oxyCODONE (ROXICODONE) immediate release tablet 5 mg (Or Linked Group #6)   5 mg, PO, Q6H PRN   Last Action:  Given, 5 mg at 05/19 0623   petrolatum (AQUAPHOR) ointment   No Dose/Rate, TP, PRN   Last Action:  Given, No Dose/Rate at 05/09 2220

## 2019-06-11 NOTE — Progress Notes (Signed)
INFECTIOUS DISEASES PROGRESS NOTE    Date Time: 06/11/19 9:49 AM  Patient Name: [name],[name]  Patient status.Inpatient  Hospital Day: 13    Assessment and Plan:   1.  PD catheter peritonitis/Pseudomonas  2. No fever or leucocytosis  3. Small collections seen on CT      Plan: 1DC. Zosyn and vanco  2.  We will review wound with wound care team at dressing change    Subjective:   Nontoxic    Review of Systems:   Review of Systems -pain around the VAC sponge      Antibiotics:   As above    Other medications reviewed in EPIC.  Central Access:   New dialysis access day 7    Physical Exam:   Afebrile  Vitals:    06/11/19 0835   BP: 114/64   Pulse: 64   Resp:    Temp:    SpO2:        Lungs: Clear  Heart: Regular rhythm  Abdomen: Bowel sounds heard.  VAC sponge    Labs:     Microbiology Results     Procedure Component Value Units Date/Time    Blood Culture Aerobic/Anaerobic #1 [[id number]] Collected: 05/27/19 0507    Specimen: Arm from Blood, Venipuncture Updated: 05/27/19 0950    Narrative:      1 BLUE+1 PURPLE    Blood Culture Aerobic/Anaerobic #2 [[id number]] Collected: 05/27/19 0507    Specimen: Arm from Blood, Venipuncture Updated: 05/27/19 0950    Narrative:      1 BLUE+1 PURPLE    COVID-19 (SARS-COV-2) [employer] [facility]) [[id number]] Collected: 05/27/19 0624    Specimen: Nasopharyngeal Swab from Nasopharynx Updated: 05/27/19 0656     Purpose of COVID testing Screening     SARS-CoV-2 Specimen Source Nasopharyngeal     SARS CoV 2 Overall Result Negative     Comment: Test performed using the Abbott ID NOW EUA assay.  Please see Fact Sheets for patients and providers located at:  http://olson-hall.info/  This test is for the qualitative detection of SARS-CoV-2  (COVID19) nucleic acid. Viral nucleic acids may persist in vivo,  independent of viability. Detection of viral nucleic acid does  not imply the presence of infectious virus, or that virus  nucleic acid is the cause of clinical symptoms.  Negative  results should be treated as presumptive and, if inconsistent  with clinical signs and symptoms or necessary for patient  management, should be tested with an alternative molecular  assay. Negative results do not preclude SARS-CoV-2 infection  and should not be used as the sole basis for patient  management decisions. Invalid results may be due to inhibiting  substances in the specimen and recollection should occur.         Narrative:      o Collect and clearly label specimen type:  o Upper respiratory specimen: One Nasopharyngeal Dry Swab NO  Transport Media.  o Hand deliver to laboratory ASAP  Indication for testing->Extended care facility admission to  semi private room    Culture + Gram Lenise Arena, Body Fluid [[id number]] Collected: 05/27/19 0534    Specimen: Body Fluid from Paracentesis Fluid Updated: 05/27/19 1009          CBC w/Diff CMP   Recent Labs   Lab 06/10/19  0321 06/08/19  0339 06/06/19  0332   WBC 7.40 7.77 9.03   Hgb 7.7* 8.0* 8.1*   Hematocrit 26.6* 27.6* 28.3*   Platelets 157 145 167   MCV 96.4* 97.9*  99.0*       PT/INR         Recent Labs   Lab 06/10/19  0321 06/08/19  0339 06/06/19  0332   Sodium 133* 136 137   Potassium 4.1 4.1 4.2   Chloride 99* 99* 100   CO2 23 25 27    BUN 44.0* 24.0* 22.0*   Creatinine 6.5* 4.1* 4.4*   Glucose 288* 216* 255*   Calcium 7.9* 7.9* 7.7*      Glucose POCT   Recent Labs   Lab 06/10/19  0321 06/08/19  0339 06/06/19  0332   Glucose 288* 216* 255*          Rads:     Radiology Results (24 Hour)     ** No results found for the last 24 hours. **            Signed by: Ernst Breach

## 2019-06-11 NOTE — OT Progress Note (Signed)
Occupational Therapy Note    [facility]  Occupational Therapy Treatment     Patient: [name]     MRN#: [id number]   Unit: [place]  Bed: A2308/A2308.B    Time of treatment:  Time Calculation  OT Received On: 06/11/19  Start Time: 7473  Stop Time: 1340  Time Calculation (min): 25 min             Chart Review and Collaboration with Care Team: 5 minutes, not included in above time.    OT Visit Number: 4    Precautions and Contraindications:    Precautions  Other Precautions: (Falls, wound vac)    Personal Protective Equipment (PPE)  gloves and procedure mask    Updated Labs:  Lab Results   Component Value Date/Time    HGB 7.7 (L) 06/10/2019 03:21 AM    HCT 26.6 (L) 06/10/2019 03:21 AM    K 4.1 06/10/2019 03:21 AM    NA 133 (L) 06/10/2019 03:21 AM    INR 0.9 03/18/2006 03:00 PM    TROPI 0.04 05/17/2019 06:20 PM    TROPI 0.05 05/17/2019 01:37 PM    TROPI 0.01 03/18/2006 06:10 PM       All imaging reviewed, please see chart for details.    Subjective:     Patient's medical condition is appropriate for Occupational Therapy intervention at this time.  Patient is agreeable to participation in the therapy session. Nursing clears patient for therapy.         Objective:  Observation of Patient/Vital Signs:    VSS    CGA supine to sit with extra time and HOB raised with instruction on technique.      Declined to practice LB dressing.  Declined to practice toilet transfer secondary toilet is dirty and want to wait after housekeeping.    Ambulated in hallway grossly 32' CGA with FWW with training and safe handling of walker, maintaining walker and appropriate distance and turning safely.  Good understanding but will need reinforcement.    Educated the patient to role of occupational therapy, plan of care, goals of therapy and safety with mobility and ADLs, home safety with verbalized understanding.     Patient left in bedside chair with alarm and all other medical equipment in place  and call bell and all personal items/needs within reach.  RN notified of session outcome.      Assessment:  Good progress towards goals.  Patient able to perform transfers and ambulation with CGA but still needs max@ with LB ADL's and toileting.  Attempted training this date, but patient declined.          PMP Activity: Step 7 - Walks out of Room  Distance Walked (ft) (Step 6,7): (75')      Plan:  Goal Formulation: Patient      Time For Goal Achievement: by time of discharge  ADL Goals  Patient will dress lower body: Moderate Assist, Not met  Pt will complete bathing: Contact Guard Assist, by time of discharge, Partly met  Mobility and Transfer Goals  Pt will transfer bed to [facility]: Supervision, Partly met  Pt will transfer bed to toilet: Supervision, with rolling walker, Partly met  Neuro Re-Ed Goals  Pt will perform dynamic sitting balance: Modified Independent, by time of discharge, Goal met                    Continue plan of care.  DME Recommended for Discharge: Front wheel walker, Civil engineer, contracting, Grab bars, Medical alert system  Discharge Recommendation: Home with supervision, Home with home health OT, Home with home health PT(will need SNF if does not have SPV)      Hulan Saas OT/L  06/11/2019 2:18 PM

## 2019-06-11 NOTE — Progress Notes (Signed)
HOSPITALIST PROGRESS NOTE      Patient: [name]  Date: 06/11/2019    LOS: 13 Days  Admission Date: 05/27/2019   MRN: [id number]  Attending: Delfina Redwood  MD, MPH                    Please contact via Secure Chat                 ASSESSMENT/PLAN     [name] is a 64 y.o. female admitted with Generalized abdominal pain    Interval Summary:     Active Hospital Problems    Diagnosis    Bowel incontinence    Thrombocytopenia    Hypotension    Type 2 diabetes mellitus, with long-term current use of insulin    GERD (gastroesophageal reflux disease)    Generalized abdominal pain    ESRD (end stage renal disease) on dialysis    Peritonitis    Peritoneal dialysis catheter dysfunction, initial encounter       Presentation History   Per H&P "[name] a 64 y.o.femalewith a PMHx of hypertension, hyperlipidemia, insulin-dependent diabetes mellitus, COPD, end-stage renal disease on peritoneal dialysis  Recently switched to hemodialysis and discharged yesterdayreturnto the ED with recurrent abdominal pain associated with Non bloodydiarrhea, nausea vomiting of nonbilious and nonbloody vomitus. Patient states that she continued to have loose watery stools on arriving at home yesterday followed by nausea vomiting 2-3 times, described abdominal pain as left-sided pain intensity greater than 10, pain is worse with palpation. She denies fever/chills, dysuria, chest pain, shortness of breath, blood in stool or vomitus. She had dialysis yesterday prior to discharge and have not arranged transportation for outpatient dialysis yet.    In the emergency room, CT abdomen pelvis showed partial small bowel obstruction, trace bilateral pleural effusions, revealed WBC of 15.22, H&H of 10.4/34.1,stool studies pending, infectious disease Dr. Lamona Curl Surgery Dr. Myrna Blazer".    Hospital Course (10 Days):    Patient was admitted for PD peritonitis.  Peritoneal fluid grew  Pseudomonas.  ID recommended IV Zosyn which was deescalated to Cipro based on susceptibility.  ID had recommended continuing antibiotics till 5/19. Given increased abdominal pain zosyn was resumed 5/16.     Patient was also treated prophylactically for C. difficile.  P.o. vancomycin was used.  ID recommend continue p.o. vancomycin till 5/19. Patient's PD catheter was removed and she was started on hemodialysis.  Outpatient hemodialysis was set up prior to discharge. PT assessed patient and recommended SNF but patient now wants to go home with home PT/OT.     Over this past weekend  patient developed increasing surgical site pain.  Continues to have persistent erythema and exquisite tenderness over certain areas of the surgical wound.  No discharge was noted.  On evaluation with CAT scan on 5/16 she was found to have evidence of surgical site infection with possible air at this time.  Bedside debridement revealed 5 cc of serous vs purulent collection.  After debridement a wound VAC was applied. No wound dehiscence noted.    Patient Active Hospital Problem List:  Generalized abdominal pain (05/27/2019)  Peritoneal dialysis catheter dysfunction, initial encounter (05/12/2019)  Peritonitis (05/24/2019)  Assessment: PD catheter removed by general surgery with exploratory laparotomy on 05/28/2019. Pleural fluid growing Pseudomonas and E. Coli. Wound site complication noted with increased pain warranting debridement and wound vac placement on 5/17.   Plan:   Follow up wound culture  Discussed with general surgery  and infectious disease  Appreciate wound VAC placement by wound nurse  Completed broad-spectrum antibiotic coverage with IV Zosyn .  Zosyn discontinued on 5/19  Follow-up on cultures  Status post oral vancomycin for C. difficile prophylaxis   incentive spirometry use encouraged   Pain regimen as below.   Wound VAC change today    Bowel incontinence   Assessment-unclear if this is a complication of prior back  injury; however this problem is chronic and has been going on for the past year  Plan-minimize risk for bedsores with frequent turning and quick response to any accidents    ESRD (end stage renal disease) on dialysis (05/24/2019)  Assessment: Patient was on PD prior to admission; she has been switched to hemodialysis for now; outpatient HD has been arranged for  Plan: Appreciate nephrology inputs   Outpatient dialysis to continue on a Tuesday Thursday Saturday schedule    Thrombocytopenia (05/31/2019)  Assessment: Could be due to the acute infection as above; platelet counts are stable  Plan: Okay to continue heparin for DVT prophylaxis  Monitor daily CBC    Hypotension (05/31/2019)  Assessment: Likely due to the infection above  Plan: Antibiotics as above    Type 2 diabetes mellitus, with long-term current use of insulin (05/31/2019)  Assessment: Chronic and unknown control  Plan: Continue with insulin as above   Diabetic diet     GERD (gastroesophageal reflux disease) (05/31/2019)  Assessment: Chronic and unknown control  Plan: c/w home dose of protonix  Continue maalox      Analgesia: Oxycodone as needed and Tylenol scheduled  Continue with gabapentin 300 mg daily    Nutrition: Cardiac and diabetic    GI Prophylaxis: None    DVT Prophylaxis: Heparin    Code Status: Full code    DISPO: Surgical site wound evaluation today to reassess.  Anticipate discharge home with wound VAC versus wet-to-dry dressing based on recommendations of general surgery at that time.        [name] states that her she has pain around abdominal wound which is worse today than yesterday.  Denies chills, no fever, no nausea or vomiting.      MEDICATIONS     Current Facility-Administered Medications   Medication Dose Route Frequency    acetaminophen  1,000 mg Oral 4 times per day    amLODIPine  5 mg Oral Q12H    atorvastatin  40 mg Oral Daily    carvedilol  25 mg Oral Q12H SCH    darbepoetin  alfa  60 mcg Subcutaneous Weekly    gabapentin  300 mg Oral Daily    heparin (porcine)  5,000 Units Subcutaneous Q12H [place]    insulin glargine  5 Units Subcutaneous Q12H    insulin lispro  1-8 Units Subcutaneous TID AC    lactobacillus/streptococcus  1 capsule Oral Daily    lidocaine  1 patch Transdermal Q24H    neomycin-bacitracin-polymyxin   Topical BID    pantoprazole  40 mg Oral QAM AC    senna-docusate  1 tablet Oral BID    sevelamer  800 mg Oral TID MEALS    zinc Oxide   Topical Q6H       PHYSICAL EXAM     Vitals:    06/11/19 0835   BP: 114/64   Pulse: 64   Resp:    Temp:    SpO2:        Temperature: Temp  Min: 97.5 F (36.4  C)  Max: 98.6 F (37 C)  Pulse: Pulse  Min: 56  Max: 67  Respiratory: Resp  Min: 14  Max: 18  Non-Invasive BP: BP  Min: 107/56  Max: 145/69  Pulse Oximetry SpO2  Min: 96 %  Max: 99 %    Intake and Output Summary (Last 24 hours) at Date Time    Intake/Output Summary (Last 24 hours) at 06/11/2019 1058  Last data filed at 06/10/2019 2143  Gross per 24 hour   Intake 1130 ml   Output 2000 ml   Net -870 ml       Physical Exam   Constitutional: She is oriented to person, place, and time and well-developed, well-nourished, and in no distress. No distress.   HENT:   Head: Normocephalic and atraumatic.   Right Ear: External ear normal.   Left Ear: External ear normal.   Mouth/Throat: Oropharynx is clear and moist.   Eyes: Pupils are equal, round, and reactive to light.   Neck: No JVD present. No tracheal deviation present. No thyromegaly present.   Cardiovascular: Normal rate, regular rhythm, normal heart sounds and intact distal pulses. Exam reveals no gallop and no friction rub.   No murmur heard.  Pulmonary/Chest: Effort normal and breath sounds normal. No stridor. No respiratory distress. She has no wheezes. She has no rales. She exhibits no tenderness.   Abdominal: Soft. Bowel sounds are normal. She exhibits no distension and no mass. There is abdominal tenderness (Surgical site has  staples removed and wound is packing with wound vac dressing. Minimal local tenderness. ). There is no rebound and no guarding.   Musculoskeletal:         General: No tenderness, deformity or edema. Normal range of motion.      Cervical back: Normal range of motion and neck supple.   Lymphadenopathy:     She has no cervical adenopathy.   Neurological: She is alert and oriented to person, place, and time. She has normal reflexes. No cranial nerve deficit. She exhibits normal muscle tone. Gait normal. Coordination normal. GCS score is 15.   Skin: Skin is warm and dry. No rash noted. She is not diaphoretic. No erythema. No pallor.   Psychiatric: Mood, memory, affect and judgment normal.     LABS     Recent Labs   Lab 06/10/19  0321 06/08/19  0339 06/06/19  0332   WBC 7.40 7.77 9.03   RBC 2.76* 2.82* 2.86*   Hgb 7.7* 8.0* 8.1*   Hematocrit 26.6* 27.6* 28.3*   MCV 96.4* 97.9* 99.0*   Platelets 157 145 167       Recent Labs   Lab 06/10/19  0321 06/08/19  0339 06/06/19  0332   Sodium 133* 136 137   Potassium 4.1 4.1 4.2   Chloride 99* 99* 100   CO2 23 25 27    BUN 44.0* 24.0* 22.0*   Creatinine 6.5* 4.1* 4.4*   Glucose 288* 216* 255*   Calcium 7.9* 7.9* 7.7*                         Microbiology Results     Procedure Component Value Units Date/Time    Anaerobic culture [[id number]] Collected: 05/27/19 2343    Specimen: Other from Wound Updated: 06/02/19 0822    Narrative:      ORDER#: [id number]  ORDERED BY: SANDERS, ENOCH  SOURCE: Wound abdominal wound                        COLLECTED:  05/27/19 23:43  ANTIBIOTICS AT COLL.:                                RECEIVED :  05/28/19 07:56  Culture, Anaerobic Bacteria                FINAL       06/02/19 08:22  06/02/19   No anaerobic growth      Anaerobic culture [[id number]] Collected: 05/27/19 2343    Specimen: Other from Drainage Updated: 06/02/19 0822    Narrative:      blue swab  ORDER#: [id number]                                    ORDERED BY:  Kalman Jewels  SOURCE: Drainage peritoneal fluid                    COLLECTED:  05/27/19 23:43  ANTIBIOTICS AT COLL.:                                RECEIVED :  05/28/19 07:56  ORDER ENTRY COMMENTS:  blue swab  Culture, Anaerobic Bacteria                FINAL       06/02/19 08:22  06/02/19   No anaerobic growth      Blood Culture Aerobic/Anaerobic #1 [[id number]] Collected: 05/27/19 0507    Specimen: Arm from Blood, Venipuncture Updated: 06/01/19 1221    Narrative:      ORDER#: [id number]                                    ORDERED BY: CHUNG, SORA  SOURCE: Blood, Venipuncture arm                      COLLECTED:  05/27/19 05:07  ANTIBIOTICS AT COLL.:                                RECEIVED :  05/27/19 09:50  Culture Blood Aerobic and Anaerobic        FINAL       06/01/19 12:21  06/01/19   No growth after 5 days of incubation.      Blood Culture Aerobic/Anaerobic #2 [[id number]] Collected: 05/27/19 0507    Specimen: Arm from Blood, Venipuncture Updated: 06/01/19 1221    Narrative:      ORDER#: [id number]                                    ORDERED BY: CHUNG, SORA  SOURCE: Blood, Venipuncture arm                      COLLECTED:  05/27/19 05:07  ANTIBIOTICS AT COLL.:  RECEIVED :  05/27/19 09:50  Culture Blood Aerobic and Anaerobic        FINAL       06/01/19 12:21  06/01/19   No growth after 5 days of incubation.      COVID-19 (SARS-COV-2) [employer]) [[id number]] Collected: 05/27/19 0624    Specimen: Nasopharyngeal Swab from Nasopharynx Updated: 05/27/19 0656     Purpose of COVID testing Screening     SARS-CoV-2 Specimen Source Nasopharyngeal     SARS CoV 2 Overall Result Negative     Comment: Test performed using the Abbott ID NOW EUA assay.  Please see Fact Sheets for patients and providers located at:  http://[facility]-hall.info/  This test is for the qualitative detection of SARS-CoV-2  (COVID19) nucleic acid. Viral nucleic acids may persist in vivo,  independent of viability.  Detection of viral nucleic acid does  not imply the presence of infectious virus, or that virus  nucleic acid is the cause of clinical symptoms. Negative  results should be treated as presumptive and, if inconsistent  with clinical signs and symptoms or necessary for patient  management, should be tested with an alternative molecular  assay. Negative results do not preclude SARS-CoV-2 infection  and should not be used as the sole basis for patient  management decisions. Invalid results may be due to inhibiting  substances in the specimen and recollection should occur.         Narrative:      o Collect and clearly label specimen type:  o Upper respiratory specimen: One Nasopharyngeal Dry Swab NO  Transport Media.  o Hand deliver to laboratory ASAP  Indication for testing->Extended care facility admission to  semi private room    Culture + Gram Lenise Arena, Body Fluid [[id number]] Collected: 05/27/19 0534    Specimen: Body Fluid from Paracentesis Fluid Updated: 06/01/19 1601    Narrative:      [id number] called Micro Results of Gram Stain. Results read back by: [id number], by  [id number] on 05/27/2019 at 14:31  < 88ml  ORDER#: [id number]                                    ORDERED BY: CHUNG, SORA  SOURCE: Paracentesis Fluid peritoneal dialysis cathetCOLLECTED:  05/27/19 05:34  ANTIBIOTICS AT COLL.:                                RECEIVED :  05/27/19 10:09  ORDER ENTRY COMMENTS:  < 31ml  [id number] called Micro Results of Gram Stain. Results read back by: [id number], by [id number] on 05/27/2019 at 14:31  Stain, Gram                                FINAL       05/27/19 14:31  05/27/19   No Squamous epithelial cells seen             No WBCs seen             Rare Gram positive cocci             Stain performed on Cytospin (concentrated) specimen  Culture and Gram Stain, Aerobic, Body FluidFINAL       06/01/19 16:01  05/30/19   The volume of sample received with this order is less than  the recommended volume. Submission of low volumes may              adversely affect recovery of pathogens and culture results             may be compromised.  06/01/19   ** No growth             ** Note:             ** Gram positive cocci seen in Gram Stain did not grow under             ** aerobic and anaerobic culture conditions, possible nonviable.      Fungus culture [[id number]] Collected: 05/27/19 2343    Specimen: Other from Wound Updated: 06/04/19 1529    Narrative:      ORDER#: [id number]                                    ORDERED BY: Kalman Jewels  SOURCE: Wound abdominal wound                        COLLECTED:  05/27/19 23:43  ANTIBIOTICS AT COLL.:                                RECEIVED :  05/28/19 07:56  Stain, Fungal                              FINAL       05/29/19 12:20  05/29/19   No Fungal or Yeast Elements Seen  Culture Fungus                             PRELIM      06/04/19 15:29  06/04/19   No growth after 1 week/s of incubation.      Fungus culture [[id number]] Collected: 05/27/19 0259    Specimen: Other from Drainage Updated: 06/04/19 1529    Narrative:      blue swab  ORDER#: [id number]                                    ORDERED BY: Kalman Jewels  SOURCE: Drainage peritoneal fluid                    COLLECTED:  05/27/19 02:59  ANTIBIOTICS AT COLL.:                                RECEIVED :  05/28/19 07:56  ORDER ENTRY COMMENTS:  blue swab  Stain, Fungal                              FINAL       05/29/19 12:20  05/29/19   No Fungal or Yeast Elements Seen  Culture Fungus                             PRELIM      06/04/19 15:29  06/04/19   No growth after 1  week/s of incubation.      Wound culture & gram stain [[id number]] Collected: 05/27/19 2343    Specimen: Wound Updated: 05/30/19 0829    Narrative:      blue swab  ORDER#: [id number]                                    ORDERED BY: Kalman Jewels  SOURCE: Wound peritoneal fluid                       COLLECTED:  05/27/19 23:43  ANTIBIOTICS AT COLL.:                                RECEIVED :  05/28/19 07:56   ORDER ENTRY COMMENTS:  blue swab  Stain, Gram                                FINAL       05/28/19 13:06  05/28/19   Few WBCs             Rare Squamous epithelial cells             No organisms seen  Culture and Gram Stain, Aerobic, Wound     FINAL       05/30/19 08:29   +  05/29/19   Light growth of Pseudomonas aeruginosa               Refer to susceptibilities on culture #[id number]        Wound culture & gram stain [[id number]] Collected: 05/27/19 0221    Specimen: Wound Updated: 05/30/19 0828    Narrative:      ORDER#: [id number]                                    ORDERED BY: Kalman Jewels  SOURCE: Wound abdominal wound                        COLLECTED:  05/27/19 02:21  ANTIBIOTICS AT COLL.:                                RECEIVED :  05/28/19 07:56  Stain, Gram                                FINAL       05/28/19 13:03  05/28/19   Moderate WBCs             Few Squamous epithelial cells             No organisms seen  Culture and Gram Stain, Aerobic, Wound     FINAL       05/30/19 08:28   +  05/30/19   Light growth of Pseudomonas aeruginosa      _____________________________________________________________________________                                  P.aeruginosa    ANTIBIOTICS  MIC  INTRP      _____________________________________________________________________________  Amikacin                        <=8    S        Aztreonam                        8     S        Cefepime                         2     S        Ceftazidime                     <=2    S        Ciprofloxacin                 <=0.25   S        Gentamicin                      <=2    S        Levofloxacin                   <=0.5   S        Meropenem                      <=0.5   S        Piperacillin/Tazobactam         4/4    S        Tobramycin                      <=2    S        _____________________________________________________________________________            S=SUSCEPTIBLE     I=INTERMEDIATE     R=RESISTANT                             N/S=NON-SUSCEPTIBLE  _____________________________________________________________________________             CT Abdomen Pelvis W WO IV Contrast and W PO Contrast    Result Date: 06/09/2019   Small ill-defined collections of fluid anterior to the uterus and along the right posterolateral margin of the uterus may represent abscesses or loculated ascites in a patient with peritonitis Elyn Peers, MD  06/09/2019 9:13 AM      Echo Results     None          I have personally reviewed the patient's labs, medications, and imaging    Total visit time = 40 mins ; more than 50% spent counseling/coordinating care    Signed,    Reynaldo Minium MD, MPH  10:58 AM 06/11/2019   Please contact via Secure Chat        This chart was generated using a medical voice-recognition software which does not employ spell-checking or grammar-checking features. It was dictated, all or in part, in a busy and often noisy patient care environment. I have taken all usual measures to dictate carefully and to review all aspects of this chart. Nonetheless, given the known and well-documented performance characteristics of voice recognition software in such patient care environments, this dictation  still may contain unrecognized and wholly unintended errors or omissions.

## 2019-06-11 NOTE — Plan of Care (Signed)
Discharge Recommendation: Home with supervision, Home with home health OT, Home with home health PT(will need SNF if does not have SPV)   DME Recommended for Discharge: Front wheel walker, Shower chair, Grab bars, Medical alert system    OT Frequency Recommended: 2-3x/wk     Is PT evaluation indicated at this time? This patient is already on PT caseload.     PMP Activity: Step 7 - Walks out of Room  Distance Walked (ft) (Step 6,7): (75')  (Please See Therapy Evaluation for device and assistance level needed)    Treatment Interventions: ADL retraining, Functional transfer training, Endurance training, Patient/Family training, Equipment eval/education, UE strengthening/ROM       Goal Formulation: Patient  Time For Goal Achievement: by time of discharge  ADL Goals  Patient will dress lower body: Moderate Assist, Not met  Pt will complete bathing: Contact Guard Assist, by time of discharge, Partly met  Mobility and Transfer Goals  Pt will transfer bed to [facility]: Supervision, Partly met  Pt will transfer bed to toilet: Supervision, with rolling walker, Partly met  Neuro Re-Ed Goals  Pt will perform dynamic sitting balance: Modified Independent, by time of discharge, Goal met                    Hulan Saas, OT  06/11/2019  2:25 PM

## 2019-06-11 NOTE — PT Progress Note (Signed)
Physical Therapy Note    [facility]  Physical Therapy Attempt Note    Patient:  [name] MRN#:  [id number]  Unit:  [address] [place] Room/Bed:  X3818/E9937.B      Physical Therapy treatment attempted on 06/11/2019 at 7:30 PM unable to complete secondary to patient declining 2/2 to increased pain post removal of wound vac and then packing of wound. Awaiting next pain med dose. Therapist will reattempt tomorrow, schedule permitting.     Madelin Headings PT  [place] [phone]  [phone]

## 2019-06-12 ENCOUNTER — Encounter (INDEPENDENT_AMBULATORY_CARE_PROVIDER_SITE_OTHER): Payer: Self-pay

## 2019-06-12 LAB — BASIC METABOLIC PANEL
Anion Gap: 13 (ref 5.0–15.0)
BUN: 41 mg/dL — ABNORMAL HIGH (ref 7.0–19.0)
CO2: 22 mEq/L (ref 22–29)
Calcium: 7.9 mg/dL — ABNORMAL LOW (ref 8.5–10.5)
Chloride: 102 mEq/L (ref 100–111)
Creatinine: 5.6 mg/dL — ABNORMAL HIGH (ref 0.6–1.0)
Glucose: 210 mg/dL — ABNORMAL HIGH (ref 70–100)
Potassium: 3.9 mEq/L (ref 3.5–5.1)
Sodium: 137 mEq/L (ref 136–145)

## 2019-06-12 LAB — CBC
Absolute NRBC: 0.02 10*3/uL — ABNORMAL HIGH (ref 0.00–0.00)
Hematocrit: 26.9 % — ABNORMAL LOW (ref 34.7–43.7)
Hgb: 7.9 g/dL — ABNORMAL LOW (ref 11.4–14.8)
MCH: 28.2 pg (ref 25.1–33.5)
MCHC: 29.4 g/dL — ABNORMAL LOW (ref 31.5–35.8)
MCV: 96.1 fL — ABNORMAL HIGH (ref 78.0–96.0)
MPV: 11.8 fL (ref 8.9–12.5)
Nucleated RBC: 0.3 /100 WBC — ABNORMAL HIGH (ref 0.0–0.0)
Platelets: 151 10*3/uL (ref 142–346)
RBC: 2.8 10*6/uL — ABNORMAL LOW (ref 3.90–5.10)
RDW: 14 % (ref 11–15)
WBC: 7.81 10*3/uL (ref 3.10–9.50)

## 2019-06-12 LAB — GLUCOSE WHOLE BLOOD - POCT
Whole Blood Glucose POCT: 166 mg/dL — ABNORMAL HIGH (ref 70–100)
Whole Blood Glucose POCT: 171 mg/dL — ABNORMAL HIGH (ref 70–100)
Whole Blood Glucose POCT: 229 mg/dL — ABNORMAL HIGH (ref 70–100)
Whole Blood Glucose POCT: 227 mg/dL — ABNORMAL HIGH (ref 70–100)

## 2019-06-12 LAB — HEMOLYSIS INDEX
Hemolysis Index: 1 (ref 0–18)
Hemolysis Index: 12 (ref 0–18)

## 2019-06-12 LAB — GFR: EGFR: 9.2

## 2019-06-12 LAB — HEPATITIS B SURFACE ANTIGEN W/ REFLEX TO CONFIRMATION: Hepatitis B Surface Antigen: NONREACTIVE

## 2019-06-12 MED ORDER — LIDOCAINE 5 % EX PTCH
1.00 | MEDICATED_PATCH | CUTANEOUS | 0 refills | Status: DC
Start: 2019-06-12 — End: 2020-01-06

## 2019-06-12 MED ORDER — ALBUMIN HUMAN 25 % IV SOLN
100.0000 mL | INTRAVENOUS | Status: AC | PRN
Start: 2019-06-12 — End: 2019-06-12

## 2019-06-12 MED ORDER — SODIUM CHLORIDE 0.9 % IV BOLUS
250.0000 mL | INTRAVENOUS | Status: AC | PRN
Start: 2019-06-12 — End: 2019-06-12

## 2019-06-12 MED ORDER — SODIUM CHLORIDE 0.9 % IV BOLUS
100.0000 mL | INTRAVENOUS | Status: AC | PRN
Start: 2019-06-12 — End: 2019-06-12

## 2019-06-12 NOTE — Plan of Care (Signed)
Patient AxOx4. Complaints of mild-moderate upper L abd pain relieved w/ acetaminophen and PRN oxycodone for severe pain. Wound vac removed and dressing placed. Wound dressing is clean, dry, and intact. VS stable. PM meds tolerated well. Insulin administered. Pt has diminished urine production. Pt is staying hydrated w/ water and ice chips. Ext cath in place. LBM 5/19. Safety measures maintained. Hourly rounding completed. Call bell within reach.    Problem: Moderate/High Fall Risk Score >5  Goal: Patient will remain free of falls  06/12/2019 0418 by Caryn Section, RN  Outcome: Progressing  Flowsheets (Taken 06/11/2019 2240)  Moderate Risk (6-13):   MOD-Consider activation of bed alarm if appropriate   MOD-Floor mat at bedside (where available) if appropriate   MOD-Remain with patient during toileting   MOD-Perform dangle, stand, walk (DSW) when getting patient up  06/12/2019 0418 by Caryn Section, RN  Outcome: Progressing  06/12/2019 0344 by Caryn Section, RN  Outcome: Progressing  Flowsheets (Taken 06/11/2019 2240)  Moderate Risk (6-13):   MOD-Consider activation of bed alarm if appropriate   MOD-Floor mat at bedside (where available) if appropriate   MOD-Remain with patient during toileting   MOD-Perform dangle, stand, walk (DSW) when getting patient up     Problem: Safety  Goal: Patient will be free from injury during hospitalization  06/12/2019 0418 by Caryn Section, RN  Outcome: Progressing  Flowsheets (Taken 06/12/2019 0344)  Patient will be free from injury during hospitalization:   Assess patient's risk for falls and implement fall prevention plan of care per policy   Provide and maintain safe environment   Use appropriate transfer methods   Ensure appropriate safety devices are available at the bedside   Include patient/ family/ care giver in decisions related to safety   Hourly rounding  06/12/2019 0418 by Caryn Section, RN  Outcome: Progressing  06/12/2019 0344 by Caryn Section, RN  Outcome:  Progressing  Flowsheets (Taken 06/12/2019 0344)  Patient will be free from injury during hospitalization:   Assess patient's risk for falls and implement fall prevention plan of care per policy   Provide and maintain safe environment   Use appropriate transfer methods   Ensure appropriate safety devices are available at the bedside   Include patient/ family/ care giver in decisions related to safety   Hourly rounding  Goal: Patient will be free from infection during hospitalization  06/12/2019 0418 by Caryn Section, RN  Outcome: Progressing  Flowsheets (Taken 06/12/2019 0344)  Free from Infection during hospitalization:   Monitor lab/diagnostic results   Assess and monitor for signs and symptoms of infection   Monitor all insertion sites (i.e. indwelling lines, tubes, urinary catheters, and drains)   Encourage patient and family to use good hand hygiene technique  06/12/2019 0418 by Caryn Section, RN  Outcome: Progressing  06/12/2019 0344 by Caryn Section, RN  Outcome: Progressing  Flowsheets (Taken 06/12/2019 0344)  Free from Infection during hospitalization:   Monitor lab/diagnostic results   Assess and monitor for signs and symptoms of infection   Monitor all insertion sites (i.e. indwelling lines, tubes, urinary catheters, and drains)   Encourage patient and family to use good hand hygiene technique     Problem: Pain  Goal: Pain at adequate level as identified by patient  06/12/2019 0418 by Caryn Section, RN  Outcome: Progressing  Flowsheets (Taken 06/12/2019 0344)  Pain at adequate level as identified by patient:   Identify patient comfort function goal   Assess for risk of opioid induced respiratory depression, including snoring/sleep  apnea. Alert healthcare team of risk factors identified.   Assess pain on admission, during daily assessment and/or before any "as needed" intervention(s)   Reassess pain within 30-60 minutes of any procedure/intervention, per Pain Assessment, Intervention, Reassessment (AIR)  Cycle   Evaluate if patient comfort function goal is met   Evaluate patient's satisfaction with pain management progress   Offer non-pharmacological pain management interventions  06/12/2019 0418 by Caryn Section, RN  Outcome: Progressing  06/12/2019 0344 by Caryn Section, RN  Outcome: Progressing  Flowsheets (Taken 06/12/2019 0344)  Pain at adequate level as identified by patient:   Identify patient comfort function goal   Assess for risk of opioid induced respiratory depression, including snoring/sleep apnea. Alert healthcare team of risk factors identified.   Assess pain on admission, during daily assessment and/or before any "as needed" intervention(s)   Reassess pain within 30-60 minutes of any procedure/intervention, per Pain Assessment, Intervention, Reassessment (AIR) Cycle   Evaluate if patient comfort function goal is met   Evaluate patient's satisfaction with pain management progress   Offer non-pharmacological pain management interventions     Problem: Side Effects from Pain Analgesia  Goal: Patient will experience minimal side effects of analgesic therapy  06/12/2019 0418 by Caryn Section, RN  Outcome: Progressing  Flowsheets (Taken 06/11/2019 0806)  Patient will experience minimal side effects of analgesic therapy:   Monitor/assess patient's respiratory status (RR depth, effort, breath sounds)   Assess for changes in cognitive function  06/12/2019 0418 by Caryn Section, RN  Outcome: Progressing  06/12/2019 0344 by Caryn Section, RN  Outcome: Progressing  Flowsheets (Taken 06/11/2019 [phone])  Patient will experience minimal side effects of analgesic therapy:   Monitor/assess patient's respiratory status (RR depth, effort, breath sounds)   Assess for changes in cognitive function     Problem: Discharge Barriers  Goal: Patient will be discharged home or other facility with appropriate resources  06/12/2019 0418 by Caryn Section, RN  Outcome: Progressing  Flowsheets (Taken 06/12/2019 0418)  Discharge  to home or other facility with appropriate resources:   Provide appropriate patient education   Provide information on available health resources  06/12/2019 0418 by Caryn Section, RN  Outcome: Progressing  06/12/2019 0344 by Caryn Section, RN  Outcome: Progressing  Flowsheets (Taken 06/12/2019 0344)  Discharge to home or other facility with appropriate resources:   Provide appropriate patient education   Provide information on available health resources     Problem: Psychosocial and Spiritual Needs  Goal: Demonstrates ability to cope with hospitalization/illness  06/12/2019 0418 by Caryn Section, RN  Outcome: Progressing  Flowsheets (Taken 06/12/2019 0344)  Demonstrates ability to cope with hospitalizations/illness:   Encourage verbalization of feelings/concerns/expectations   Provide quiet environment   Assist patient to identify own strengths and abilities   Encourage participation in diversional activity   Encourage patient to set small goals for self   Reinforce positive adaptation of new coping behaviors  06/12/2019 0418 by Caryn Section, RN  Outcome: Progressing  06/12/2019 0344 by Caryn Section, RN  Outcome: Progressing  Flowsheets (Taken 06/12/2019 0344)  Demonstrates ability to cope with hospitalizations/illness:   Encourage verbalization of feelings/concerns/expectations   Provide quiet environment   Assist patient to identify own strengths and abilities   Encourage participation in diversional activity   Encourage patient to set small goals for self   Reinforce positive adaptation of new coping behaviors     Problem: Altered GI Function  Goal: Fluid and electrolyte balance are achieved/maintained  06/12/2019 0418 by Elenor Legato,  [employer], RN  Outcome: Progressing  Flowsheets (Taken 06/12/2019 0344)  Fluid and electrolyte balance are achieved/maintained:   Monitor intake and output every shift   Monitor/assess lab values and report abnormal values   Provide adequate hydration   Assess for  confusion/personality changes   Monitor daily weight   Assess and reassess fluid and electrolyte status   Observe for seizure activity and initiate seizure precautions if indicated   Monitor for muscle weakness   Observe for cardiac arrhythmias  06/12/2019 0418 by Caryn Section, RN  Outcome: Progressing  06/12/2019 0344 by Caryn Section, RN  Outcome: Progressing  Flowsheets (Taken 06/12/2019 0344)  Fluid and electrolyte balance are achieved/maintained:   Monitor intake and output every shift   Monitor/assess lab values and report abnormal values   Provide adequate hydration   Assess for confusion/personality changes   Monitor daily weight   Assess and reassess fluid and electrolyte status   Observe for seizure activity and initiate seizure precautions if indicated   Monitor for muscle weakness   Observe for cardiac arrhythmias  Goal: Elimination patterns are normal or improving  06/12/2019 0418 by Caryn Section, RN  Outcome: Progressing  Flowsheets (Taken 06/12/2019 0344)  Elimination patterns are normal or improving:   Report abnormal assessment to physician   Anticipate/assist with toileting needs   Assess for normal bowel sounds   Monitor for abdominal distension   Monitor for abdominal discomfort   Assess for signs and symptoms of bleeding.  Report signs of bleeding to physician   Administer treatments as ordered  06/12/2019 0418 by Caryn Section, RN  Outcome: Progressing  06/12/2019 0344 by Caryn Section, RN  Outcome: Progressing  Flowsheets (Taken 06/12/2019 0344)  Elimination patterns are normal or improving:   Report abnormal assessment to physician   Anticipate/assist with toileting needs   Assess for normal bowel sounds   Monitor for abdominal distension   Monitor for abdominal discomfort   Assess for signs and symptoms of bleeding.  Report signs of bleeding to physician   Administer treatments as ordered  Goal: Nutritional intake is adequate  06/12/2019 0418 by Caryn Section, RN  Outcome:  Progressing  Flowsheets (Taken 06/12/2019 0344)  Nutritional intake is adequate:   Monitor daily weights   Assist patient with meals/food selection   Allow adequate time for meals   Encourage/perform oral hygiene as appropriate   Encourage/administer dietary supplements as ordered (i.e. tube feed, TPN, oral, OGT/NGT, supplements)   Consult/collaborate with Clinical Nutritionist  06/12/2019 0418 by Caryn Section, RN  Outcome: Progressing  06/12/2019 0344 by Caryn Section, RN  Outcome: Progressing  Flowsheets (Taken 06/12/2019 0344)  Nutritional intake is adequate:   Monitor daily weights   Assist patient with meals/food selection   Allow adequate time for meals   Encourage/perform oral hygiene as appropriate   Encourage/administer dietary supplements as ordered (i.e. tube feed, TPN, oral, OGT/NGT, supplements)   Consult/collaborate with Clinical Nutritionist  Goal: Mobility/Activity is maintained at optimal level for patient  06/12/2019 0418 by Caryn Section, RN  Outcome: Progressing  Flowsheets (Taken 06/12/2019 0418)  Mobility/activity is maintained at optimal level for patient:   Increase mobility as tolerated/progressive mobility   Encourage independent activity per ability   Maintain proper body alignment   Plan activities to conserve energy, plan rest periods   Reposition patient every 2 hours and as needed unless able to reposition self   Perform active/passive ROM   Assess for changes in respiratory status, level of consciousness and/or development of fatigue  06/12/2019 0418 by Caryn Section, RN  Outcome: Progressing  06/12/2019 0344 by Caryn Section, RN  Outcome: Progressing  Goal: No bleeding  06/12/2019 0418 by Caryn Section, RN  Outcome: Progressing  06/12/2019 0418 by Caryn Section, RN  Outcome: Progressing  06/12/2019 0344 by Caryn Section, RN  Outcome: Progressing     Problem: Compromised Tissue integrity  Goal: Damaged tissue is healing and protected  06/12/2019 0418 by Caryn Section,  RN  Outcome: Progressing  Flowsheets (Taken 06/12/2019 0418)  Damaged tissue is healing and protected:   Monitor/assess Braden scale every shift   Provide wound care per wound care algorithm   Reposition patient every 2 hours and as needed unless able to reposition self   Increase activity as tolerated/progressive mobility   Relieve pressure to bony prominences for patients at moderate and high risk   Avoid shearing injuries   Keep intact skin clean and dry   Use bath wipes, not soap and water, for daily bathing   Use incontinence wipes for cleaning urine, stool and caustic drainage. Foley care as needed   Monitor external devices/tubes for correct placement to prevent pressure, friction and shearing   Encourage use of lotion/moisturizer on skin  06/12/2019 0418 by Caryn Section, RN  Outcome: Progressing  06/12/2019 0344 by Caryn Section, RN  Outcome: Progressing  Goal: Nutritional status is improving  06/12/2019 0418 by Caryn Section, RN  Outcome: Progressing  Flowsheets (Taken 06/11/2019 0806)  Nutritional status is improving:   Assist patient with eating   Allow adequate time for meals   Encourage patient to take dietary supplement(s) as ordered   Include patient/patient care companion in decisions related to nutrition  06/12/2019 0418 by Caryn Section, RN  Outcome: Progressing  06/12/2019 0344 by Caryn Section, RN  Outcome: Progressing     Problem: Renal Instability  Goal: Fluid and electrolyte balance are achieved/maintained  06/12/2019 0418 by Caryn Section, RN  Outcome: Progressing  Flowsheets (Taken 06/12/2019 0418)  Fluid and electrolyte balance are achieved/maintained:   Monitor intake and output every shift   Monitor/assess lab values and report abnormal values   Provide adequate hydration   Monitor daily weight   Assess for confusion/personality changes   Assess and reassess fluid and electrolyte status   Monitor for muscle weakness   Observe for cardiac arrhythmias  06/12/2019 0418 by Caryn Section, RN  Outcome: Progressing  06/12/2019 0344 by Caryn Section, RN  Outcome: Progressing  Goal: Free from infection  06/12/2019 0418 by Caryn Section, RN  Outcome: Progressing  Flowsheets (Taken 06/12/2019 0418)  Free from infection: Monitor/assess for signs and symptoms of infection  06/12/2019 0418 by Caryn Section, RN  Outcome: Progressing  06/12/2019 0344 by Caryn Section, RN  Outcome: Progressing     Problem: Patient Receiving Advanced Renal Therapies  Goal: Therapy access site remains intact  06/12/2019 0418 by Caryn Section, RN  Outcome: Progressing  06/12/2019 0418 by Caryn Section, RN  Outcome: Progressing  06/12/2019 0344 by Caryn Section, RN  Outcome: Progressing

## 2019-06-12 NOTE — Progress Notes (Signed)
3.5Hrs HD tx. Completed uneventful; pt. Stable throughout HD. Rt.CVC worked well during tx.; VSS. Report given to April Royal Hawthorn RN.     06/12/19 1310   Treatment Summary   Time Off Machine 1300   Duration of Treatment (Hours) 3.5   Dialyzer Clearance Moderately streaked   Fluid Volume Off (mL) 3400   Prime Volume (mL) 200   Rinseback Volume (mL) 200   Fluid Given: Normal Saline (mL) 0   Fluid Given: PRBC  0 mL   Fluid Given: Albumin (mL) 0   Fluid Given: Other (mL) 0   Total Fluid Given 400   Hemodialysis Net Fluid Removed 3000   Post Treatment Assessment   Post-Treatment Weight (Kg) -3   Patient Response to Treatment tolerated well   Additional Dialyzer Used 0   Permacath/Temporary Catheter 05/23/19 Permacath Right   Placement Date/Time: 05/23/19 1342   Inserted by: Papadouris  Access Type: Permacath  Orientation: Right  Access Location: Tunneled, Chest  Catheter Tip Location: svc  Central Line Infection Prevention Education provided?: Yes;No  Hand Hygiene: Nolan...   Catheter Lumen Volume Venous 1.7 mL   Catheter Lumen Volume Arterial 1.7 mL   Dressing Status and Intervention Dressing Intact   Tego Caps on Catheter Yes   NEW Tego Caps placed (Date) 06/10/19   Vitals   Temp 97.6 F (36.4 C)   Heart Rate 66   Resp Rate 18   BP 141/68   SpO2 98 %   O2 Device None (Room air)   Assessment   Mental Status Alert;Oriented;Cooperative   Cardiac (WDL) WDL   Cardiac Regularity Regular   Cardiac Symptoms None   Cardiac Rhythm Normal Sinus Rhythm   Respiratory  WDL   Respiratory Pattern Regular   Bilateral Breath Sounds Clear   R Breath Sounds Clear   L Breath Sounds Clear   Cough Spontaneous   Edema  WDL   Generalized Edema None   Facial None   Sacral UTA   RLE Edema Non Pitting Edema   LLE Edema Non Pitting Edema   General Skin Color Appropriate for ethnicity   Skin Condition/Temp Warm;Dry   Gastrointestinal (WDL) WDL   Abdomen Inspection Soft;Rounded   GI Symptoms None   Mobility Ambulatory with Assistance   Pain  Assessment   Charting Type Assessment   Pain Scale Used Numeric Scale (0-10)   Numeric Pain Scale   Pain Score 0   POSS Score 1   Pain Location Abdomen   Pain Orientation Left;Anterior   Education   Person taught Patient   Knowledge basis Minimal   Topics taught Access care;S&S of infection   Teaching Tools Explain   [name] Understanding   Bedside Nurse Communication   Name of bedside RN - post dialysis [name]

## 2019-06-12 NOTE — Progress Notes (Signed)
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

## 2019-06-12 NOTE — Progress Notes (Signed)
GENERAL SURGERY PROGRESS NOTE  [facility] [phone]    Date Time: 06/12/19 6:20 AM  Patient Name: [facility] Day: 14    ASSESSMENT:     Patient Active Problem List   Diagnosis    Iron deficiency anemia secondary to inadequate dietary iron intake    Sideropenic dysphagia    Peritoneal dialysis catheter dysfunction, initial encounter    Peritonitis    Upper GI bleed    ESRD (end stage renal disease) on dialysis    Generalized abdominal pain    Thrombocytopenia    Hypotension    Type 2 diabetes mellitus, with long-term current use of insulin    GERD (gastroesophageal reflux disease)    Bowel incontinence     POD14 from PD cath removal  Wound vac taken off yesterday with gauze packing placed by [facility]  No e/o infection today, minimal fibrinous exudate at the wound egdes    PLAN:   Dressing changes per [facility] recommendations-> every other day with maxorb AG and hydrogel  Would not re-apply wound vac given significant pain with it in place  F/up Cultures: with no org seen, unlikely to require abx   Ok to [place] from surgery perspective, patient will need [facility] home care for discharge (ordered yesterday), f/up with Dr. Baird Cancer in 1-2 weeks for wound check.    This patient was personally seen and examined by me and I agree with the assessment and plan above.  All notes, labs, and xrays were reviewed.  The patient was seen on the date the note was written.        Iran Sizer , MD [facility]  [facility]  [address] [place], [place] [address]  [phone]            SUBJECTIVE:   The patient is doing fairly well.  Abdominal pain is  Decreased after taking off the wound vac.  Current dietary status:  Glucerna Supplement Quantity: A. One; Flavor: Strawberry; Frequency: Daily with lunch  Diet consistent carbohydrate and renal Protein restriction: 80 GM Protein  Supervise For Meals Frequency: All meals and tolerating well.  Flatus: yes. BM:  yes.  Additional complaints: none      Objective:   Current Vitals:   Vitals:    06/12/19 0344   BP: 137/75   Pulse: 66   Resp: 16   Temp: 97.5 F (36.4 C)   SpO2: 97%       Intake and Output Summary (Last 24 hours):  I/O last 3 completed shifts:  In: 2200 [P.O.:2100; I.V.:100]  Out: 2550 [Urine:550; Other:2000]    Labs:     Results     Procedure Component Value Units Date/Time    Basic Metabolic Panel [[id number]]  (Abnormal) Collected: 06/12/19 0341    Specimen: Blood Updated: 06/12/19 0542     Glucose 210 mg/dL      BUN 41.0 mg/dL      Creatinine 5.6 mg/dL      Calcium 7.9 mg/dL      Sodium 137 mEq/L      Potassium 3.9 mEq/L      Chloride 102 mEq/L      CO2 22 mEq/L      Anion Gap 13.0    Hemolysis index [[id number]] Collected: 06/12/19 0341     Updated: 06/12/19 0542     Hemolysis Index 1    GFR [[id number]] Collected: 06/12/19 0341     Updated: 06/12/19 0923  EGFR 9.2    CBC without differential [[id number]]  (Abnormal) Collected: 06/12/19 0341    Specimen: Blood Updated: 06/12/19 0502     WBC 7.81 x10 3/uL      Hgb 7.9 g/dL      Hematocrit 26.9 %      Platelets 151 x10 3/uL      RBC 2.80 x10 6/uL      MCV 96.1 fL      MCH 28.2 pg      MCHC 29.4 g/dL      RDW 14 %      MPV 11.8 fL      Nucleated RBC 0.3 /100 WBC      Absolute NRBC 0.02 x10 3/uL     Culture + Gram Stain,Aerobic, Wound [[id number]] Collected: 06/09/19 1119    Specimen: Wound Updated: 06/11/19 2140    Narrative:      ORDER#: [id number]                                    ORDERED BY: Gennaro Africa  SOURCE: Wound Abdominal wound                        COLLECTED:  06/09/19 11:19  ANTIBIOTICS AT COLL.:                                RECEIVED :  06/09/19 15:00  Stain, Gram                                FINAL       06/09/19 16:27  06/09/19   No Squamous epithelial cells seen             Few WBCs             No organisms seen  Culture and Gram Stain, Aerobic, Wound     PRELIM      06/11/19 21:40  06/10/19   Culture no growth to date, Final report to follow  06/11/19   Culture requires further  incubation, results to follow      Glucose Whole Blood - POCT [[id number]]  (Abnormal) Collected: 06/11/19 2055     Updated: 06/11/19 2129     Whole Blood Glucose POCT 179 mg/dL     Glucose Whole Blood - POCT [[id number]]  (Abnormal) Collected: 06/11/19 1633     Updated: 06/11/19 1654     Whole Blood Glucose POCT 246 mg/dL     Fungus culture [[id number]] Collected: 05/27/19 0259    Specimen: Other from Drainage Updated: 06/11/19 1529    Narrative:      blue swab  ORDER#: [id number]                                    ORDERED BY: Kalman Jewels  SOURCE: Drainage peritoneal fluid                    COLLECTED:  05/27/19 02:59  ANTIBIOTICS AT COLL.:                                RECEIVED :  05/28/19 07:56  ORDER  ENTRY COMMENTS:  blue swab  Stain, Fungal                              FINAL       05/29/19 12:20  05/29/19   No Fungal or Yeast Elements Seen  Culture Fungus                             PRELIM      06/11/19 15:29  06/04/19   No growth after 1 week/s of incubation.  06/11/19   No growth after 2 week/s of incubation.      Fungus culture [[id number]] Collected: 05/27/19 2343    Specimen: Other from Wound Updated: 06/11/19 1529    Narrative:      ORDER#: [id number]                                    ORDERED BY: Kalman Jewels  SOURCE: Wound abdominal wound                        COLLECTED:  05/27/19 23:43  ANTIBIOTICS AT COLL.:                                RECEIVED :  05/28/19 07:56  Stain, Fungal                              FINAL       05/29/19 12:20  05/29/19   No Fungal or Yeast Elements Seen  Culture Fungus                             PRELIM      06/11/19 15:29  06/04/19   No growth after 1 week/s of incubation.  06/11/19   No growth after 2 week/s of incubation.      Glucose Whole Blood - POCT [[id number]]  (Abnormal) Collected: 06/11/19 1132     Updated: 06/11/19 1231     Whole Blood Glucose POCT 168 mg/dL           Rads:     Radiology Results (24 Hour)     ** No results found for the last 24 hours. **             Medications:     Current Facility-Administered Medications   Medication Dose Route Frequency    acetaminophen  1,000 mg Oral 4 times per day    amLODIPine  5 mg Oral Q12H    atorvastatin  40 mg Oral Daily    carvedilol  25 mg Oral Q12H SCH    darbepoetin alfa  60 mcg Subcutaneous Weekly    gabapentin  300 mg Oral Daily    heparin (porcine)  5,000 Units Subcutaneous Q12H [place]    insulin glargine  5 Units Subcutaneous Q12H    insulin lispro  1-8 Units Subcutaneous TID AC    lactobacillus/streptococcus  1 capsule Oral Daily    lidocaine  1 patch Transdermal Q24H    neomycin-bacitracin-polymyxin   Topical BID    pantoprazole  40 mg Oral QAM AC    senna-docusate  1 tablet Oral BID  sevelamer  800 mg Oral TID MEALS    zinc Oxide   Topical Q6H      sodium chloride 20 mL/hr at 05/27/19 1100    sodium chloride 15 mL/hr at 06/02/19 2057    lactated ringers       sodium chloride, bisacodyl, Nursing communication: Adult Hypoglycemia Treatment Algorithm **AND** dextrose **AND** dextrose **AND** glucagon (rDNA), Nursing communication: Adult Hypoglycemia Treatment Algorithm **AND** dextrose **AND** dextrose **AND** glucagon (rDNA), diphenhydrAMINE, guaiFENesin-dextromethorphan, iohexol, naloxone, ondansetron, ondansetron **OR** ondansetron, oxyCODONE **OR** oxyCODONE, petrolatum     Physical Exam:     General appearance - alert, well appearing, and in no distress  Mental status - normal mood, behavior, speech, dress, motor activity, and thought processes  Chest - clear to auscultation, no wheezes, rales or rhonchi, symmetric air entry  Heart - normal rate and regular rhythm  Abdomen - soft, mildly tender around the wound, non-distended, no rebound/guarding  Wound - no erythema or induration, wound with fibrinous exudate along edges with packing in place  Extremities - no pedal edema noted    Signed by: Sharon Seller

## 2019-06-12 NOTE — Progress Notes (Signed)
[facility] ([place])    Received a referral to schedule a follow up appointment with the [facility]  Appointment scheduled for Tuesday 06/17/2019 at 11:30 am    Clinic address is as follows:    [address] [place], [place]      Please notify patient to arrive 15 minutes early to the appointment and bring the following materials with them:    Insurance card (if insured) and photo ID  Medications in their original bottles  Glucometer/blood sugar log (if diabetic)  Weight log (if heart failure)  Proof of income (to enroll in medication assistance programs-first two pages of signed 1040 tax forms or last 2 months of pay stubs)      Allena Earing   Patient Access II  [employer] [facility]  P: [phone]  F: [phone]

## 2019-06-12 NOTE — Discharge Summary (Addendum)
[facility]      Patient: [name]  Admission Date: 05/27/2019   DOB: 1955/12/20  Discharge Date: 06/12/2019   MRN: [id number]  Discharge [facility]     Referring Physician: Pcp, None, MD  PCP: Pcp, None, MD       DISCHARGE SUMMARY     Discharge Information   Admission Diagnosis:   Generalized abdominal pain    Discharge Diagnosis:   Active Hospital Problems    Diagnosis    Bowel incontinence    Thrombocytopenia    Hypotension    Type 2 diabetes mellitus, with long-term current use of insulin    GERD (gastroesophageal reflux disease)    Generalized abdominal pain    ESRD (end stage renal disease) on dialysis    Peritonitis    Peritoneal dialysis catheter dysfunction, initial encounter        Admission Condition: abdominal pain   Discharge Condition: Stable  Consultants: Infectious disease, surgery, nephrology  Functional Status: Ambulating with walker  Discharged to: Home with [facility] PT  Procedures: PD catheter removal, wound vacuum removal  Surgeries: PD catheter removal    Imaging:     CT Angiogram Chest    Result Date: 05/18/2019     No evidence of pulmonary embolism. Small bilateral pleural effusions. Large hiatal hernia. Trace perihepatic ascites. Other nonurgent findings as above. Lonia Skinner, MD  05/18/2019 9:58 AM    CT Abd/Pelvis with IV Contrast only    Result Date: 05/27/2019  1. Fluid-filled mildly distended loops of small bowel with distal decompressed small bowel loops. Findings may represent early or partial small bowel obstruction. Enteritis is felt to be less likely. 2. Trace bilateral pleural effusions. 3. Additional chronic findings as above. Delaney Meigs, MD  05/27/2019 6:07 AM    CT Abdomen Pelvis W WO IV Contrast and W PO Contrast    Result Date: 06/09/2019   Small ill-defined collections of fluid anterior to the uterus and along the right posterolateral margin of the uterus may represent abscesses or loculated ascites in a patient with peritonitis Elyn Peers, MD   06/09/2019 9:13 AM    Tunneled Cath Placement (Permcath)    Result Date: 05/23/2019   Technically successful placement of an  23cm Equistream catheter via the right internal jugular vein as described above. Phlebitic changes in the right internal jugular vein from prior catheter placement. Dimitrios Papadouris, MD  05/23/2019 3:51 PM     Echo Results     None        Discharge Medications:     Medication List      START taking these medications    lactobacillus/streptococcus Caps  Take 1 capsule by mouth daily     lidocaine 5 %  Commonly known as: LIDODERM  Place 1 patch onto the skin every 24 hours Remove & Discard patch within 12 hours or as directed by MD     neomycin-bacitracin-polymyxin ointment  Commonly known as: NEOSPORIN  Apply topically 2 (two) times daily for 7 days     oxyCODONE 5 MG immediate release tablet  Commonly known as: ROXICODONE  Take 1 tablet (5 mg total) by mouth every 6 (six) hours as needed for Pain     zinc Oxide 40 % Pste paste  Commonly known as: DESITIN  Apply topically every 6 (six) hours        CHANGE how you take these medications    carvedilol 25 MG tablet  Commonly known as: COREG  Take 1  tablet (25 mg total) by mouth every 12 (twelve) hours  What changed: when to take this        CONTINUE taking these medications    amLODIPine 5 MG tablet  Commonly known as: NORVASC  Take 1 tablet (5 mg total) by mouth every 12 (twelve) hours     atorvastatin 40 MG tablet  Commonly known as: LIPITOR     famotidine 40 MG tablet  Commonly known as: PEPCID     gabapentin 600 MG tablet  Commonly known as: NEURONTIN     insulin aspart 100 UNIT/ML injection  Commonly known as: NovoLOG     insulin glargine 100 UNIT/ML injection  Commonly known as: LANTUS  Inject 10 Units into the skin every morning     pantoprazole 40 MG tablet  Commonly known as: PROTONIX  Take 1 tablet (40 mg total) by mouth 2 (two) times daily     sevelamer 800 MG tablet  Commonly known as: RENVELA     Trulicity 8.50 YD/7.4JO  Sopn  Generic drug: Dulaglutide  Notes to patient: Resume as outpatient schedule        STOP taking these medications    furosemide 40 MG tablet  Commonly known as: LASIX     oxyCODONE-acetaminophen 5-325 MG per tablet  Commonly known as: PERCOCET     Tresiba 100 UNIT/ML Soln  Generic drug: Insulin Degludec        ASK your doctor about these medications    vancomycin HCl 250 MG/5ML oral solution  Commonly known as: FIRVANQ  Take 2.5 mLs (125 mg total) by mouth every 6 (six) hours for 7 days  Ask about: Should I take this medication?           Where to Get Your Medications      These medications were sent to [facility], [address], [place] [address]    Phone: [phone]    lidocaine 5 %     Information about where to get these medications is not yet available    Ask your nurse or doctor about these medications   carvedilol 25 MG tablet   lactobacillus/streptococcus Caps   neomycin-bacitracin-polymyxin ointment   oxyCODONE 5 MG immediate release tablet   vancomycin HCl 250 MG/5ML oral solution   zinc Oxide 40 % Pste paste             Hospital Course   Presentation History   Per H&P "[name] is a 64 y.o. female with a PMHx of  hypertension, hyperlipidemia, insulin-dependent diabetes mellitus, COPD, end-stage renal disease on peritoneal dialysis   Recently switched to hemodialysis and discharged yesterday return to the ED with recurrent abdominal pain associated with Non bloody diarrhea, nausea vomiting of nonbilious and nonbloody vomitus.  Patient states that she continued to have loose watery stools on arriving at home yesterday followed by nausea vomiting 2-3 times, described abdominal pain as left-sided pain intensity greater than 10, pain is worse with palpation.  She denies fever/chills, dysuria, chest pain, shortness of breath, blood in stool or vomitus.  She had dialysis yesterday prior to discharge and have not  arranged transportation for outpatient dialysis yet.    In the emergency room, CT abdomen pelvis showed partial small bowel obstruction, trace bilateral pleural effusions, revealed WBC of 15.22, H&H of 10.4/34.1, stool studies pending, infectious disease Dr. Lahoma Crocker and Surgery Dr. Signa Kell consulted".  Hospital Course (14 Days):      Patient was admitted for PD peritonitis. Peritoneal fluid grew Pseudomonas. ID recommended IV Zosyn which was deescalated to Cipro based on susceptibility. ID had recommended continuing antibiotics till 5/19. Given increased abdominal pain zosyn was resumed 5/16.  Patient continued antibiotics and this was discontinued on 5/19.  ID recommended no antibiotics on discharge.    Patient was also treated prophylactically for C. difficile. P.o. vancomycin was used. ID recommend continue p.o. vancomycin till 5/19. Patient's PD catheter was removed and she was started on hemodialysis.  Outpatient hemodialysis was set up prior to discharge. PT assessed patient and recommended SNF but patient now wants to go home with home PT/OT.     Over this past weekend  patient developed increasing surgical site pain.  Continues to have persistent erythema and exquisite tenderness over certain areas of the surgical wound.  No discharge was noted.  On evaluation with CAT scan on 5/16 she was found to have evidence of surgical site infection with possible air at this time.  Bedside debridement revealed 5 cc of serous vs purulent collection.  After debridement a wound VAC was applied. No wound dehiscence noted.  Patient had pain due to wound VAC in place and wound VAC was discontinued and surgery assessed patient and recommended okay to discharge home with wound care nurse arranged.  Patient will follow up with surgery in 1 to 2 weeks for wound check.  Plan of discharge and follow-up appointments were discussed with patient at the bedside and she voiced understanding.      Generalized abdominal pain  (05/27/2019)  Peritoneal dialysis catheter dysfunction, initial encounter (05/12/2019)  Peritonitis (05/24/2019)  Assessment: PD catheter removed by general surgery with exploratory laparotomy on 05/28/2019. Pleural fluid growing Pseudomonas and E. Coli. Wound site complication noted with increased pain warranting debridement and wound vac placement on 5/17.   Plan:   Follow up wound culture  Discussed with general surgery and infectious disease  Appreciate wound VAC placement by wound nurse  Completed broad-spectrum antibiotic coverage with IV Zosyn .  Zosyn discontinued on 5/19  Follow-up on cultures  Status post oral vancomycin for C. difficile prophylaxis   incentive spirometry use encouraged   Pain regimen as below.   Wound VAC change today    Bowel incontinence   Assessment-unclear if this is a complication of prior back injury; however this problem is chronic and has been going on for the past year  Plan-minimize risk for bedsores with frequent turning and quick response to any accidents    ESRD (end stage renal disease) on dialysis (05/24/2019)  Assessment: Patient was on PD prior to admission; she has been switched to hemodialysis for now; outpatient HD has been arranged for  Plan: Appreciate nephrology inputs   Outpatient dialysis to continue on a Tuesday Thursday Saturday schedule    Thrombocytopenia (05/31/2019)  Assessment: Could be due to the acute infection as above; platelet counts are stable  Plan: Okay to continue heparin for DVT prophylaxis  Monitor daily CBC    Hypotension (05/31/2019)  Assessment: Likely due to the infection above  Plan: Antibiotics as above    Type 2 diabetes mellitus, with long-term current use of insulin (05/31/2019)  Assessment: Chronic and unknown control  Plan: Continue with insulin as above   Diabetic diet     GERD (gastroesophageal reflux disease) (05/31/2019)  Assessment: Chronic and unknown control  Plan: c/w home dose of protonix  Continue maalox    RECOMMENDATIONS:    -  Patient was informed of abnormal and incidental imaging findings during hospitalization, and advised to review this information with their  medical provider.       Best Practices   Was the patient admitted with either a CHF Exacerbation or Pneumonia? NO     Progress Note/Physical Exam at Discharge     Subjective: Patient reported feeling well, and is ready for discharge.    Vitals:    06/12/19 1300 06/12/19 1310 06/12/19 1428 06/12/19 1608   BP: 134/60 141/68 138/77 133/74   Pulse: 66 66  60   Resp: 17 18  16    Temp: 97.6 F (36.4 C) 97.6 F (36.4 C)  98.2 F (36.8 C)   TempSrc:    Oral   SpO2: 99% 98%  97%   Weight:       Height:           Constitutional: She is oriented to person, place, and time. She appears well-developed. No distress.   HENT:    Head: Normocephalic and atraumatic.   Eyes: EOM are normal. Pupils are equal, round, and reactive to light.   Neck: Normal range of motion. Neck supple. No tracheal deviation present. No thyromegaly present.   Cardiovascular: Normal rate, regular rhythm, normal heart sounds and intact distal pulses.    No murmur heard.  Pulmonary/Chest: Effort normal and breath sounds normal. No respiratory distress.   Abdominal: Soft. Bowel sounds are normal. Wound dressing clean and dry mild tenderness around dressing.  Musculoskeletal: Normal range of motion.   Neurological: She is alert and oriented to person, place, and time.   Skin: Skin is warm and dry. She is not diaphoretic.   Psychiatric: She has a normal mood and affect. Her behavior is normal. Judgment and thought content normal.          Diagnostics     Labs/Studies Pending at Discharge:    Last Labs   Recent Labs   Lab 06/12/19  0341 06/10/19  0321 06/08/19  0339   WBC 7.81 7.40 7.77   RBC 2.80* 2.76* 2.82*   Hgb 7.9* 7.7* 8.0*   Hematocrit 26.9* 26.6* 27.6*   MCV 96.1* 96.4* 97.9*   Platelets 151 157 145       Recent Labs   Lab 06/12/19  0341 06/10/19  0321 06/08/19  0339 06/06/19  0332   Sodium  137 133* 136 137   Potassium 3.9 4.1 4.1 4.2   Chloride 102 99* 99* 100   CO2 22 23 25 27    BUN 41.0* 44.0* 24.0* 22.0*   Creatinine 5.6* 6.5* 4.1* 4.4*   Glucose 210* 288* 216* 255*   Calcium 7.9* 7.9* 7.9* 7.7*       Microbiology Results     Procedure Component Value Units Date/Time    Anaerobic culture [[id number]] Collected: 05/27/19 2343    Specimen: Other from Wound Updated: 06/02/19 0822    Narrative:      ORDER#: [id number]                                    ORDERED BY: Kalman Jewels  SOURCE: Wound abdominal wound                        COLLECTED:  05/27/19 23:43  ANTIBIOTICS AT COLL.:  RECEIVED :  05/28/19 07:56  Culture, Anaerobic Bacteria                FINAL       06/02/19 08:22  06/02/19   No anaerobic growth      Anaerobic culture [[id number]] Collected: 05/27/19 2343    Specimen: Other from Drainage Updated: 06/02/19 0822    Narrative:      blue swab  ORDER#: [id number]                                    ORDERED BY: Kalman Jewels  SOURCE: Drainage peritoneal fluid                    COLLECTED:  05/27/19 23:43  ANTIBIOTICS AT COLL.:                                RECEIVED :  05/28/19 07:56  ORDER ENTRY COMMENTS:  blue swab  Culture, Anaerobic Bacteria                FINAL       06/02/19 08:22  06/02/19   No anaerobic growth      Blood Culture Aerobic/Anaerobic #1 [[id number]] Collected: 05/27/19 0507    Specimen: Arm from Blood, Venipuncture Updated: 06/01/19 1221    Narrative:      ORDER#: [id number]                                    ORDERED BY: CHUNG, SORA  SOURCE: Blood, Venipuncture arm                      COLLECTED:  05/27/19 05:07  ANTIBIOTICS AT COLL.:                                RECEIVED :  05/27/19 09:50  Culture Blood Aerobic and Anaerobic        FINAL       06/01/19 12:21  06/01/19   No growth after 5 days of incubation.      Blood Culture Aerobic/Anaerobic #2 [[id number]] Collected: 05/27/19 0507    Specimen: Arm from Blood, Venipuncture Updated: 06/01/19 1221     Narrative:      ORDER#: [id number]                                    ORDERED BY: CHUNG, SORA  SOURCE: Blood, Venipuncture arm                      COLLECTED:  05/27/19 05:07  ANTIBIOTICS AT COLL.:                                RECEIVED :  05/27/19 09:50  Culture Blood Aerobic and Anaerobic        FINAL       06/01/19 12:21  06/01/19   No growth after 5 days of incubation.      COVID-19 (SARS-COV-2) [employer]) [[id number]] Collected: 05/27/19 0624    Specimen: Nasopharyngeal Swab from Nasopharynx  Updated: 05/27/19 6761     Purpose of COVID testing Screening     SARS-CoV-2 Specimen Source Nasopharyngeal     SARS CoV 2 Overall Result Negative     Comment: Test performed using the Abbott ID NOW EUA assay.  Please see Fact Sheets for patients and providers located at:  http://olson-hall.info/  This test is for the qualitative detection of SARS-CoV-2  (COVID19) nucleic acid. Viral nucleic acids may persist in vivo,  independent of viability. Detection of viral nucleic acid does  not imply the presence of infectious virus, or that virus  nucleic acid is the cause of clinical symptoms. Negative  results should be treated as presumptive and, if inconsistent  with clinical signs and symptoms or necessary for patient  management, should be tested with an alternative molecular  assay. Negative results do not preclude SARS-CoV-2 infection  and should not be used as the sole basis for patient  management decisions. Invalid results may be due to inhibiting  substances in the specimen and recollection should occur.         Narrative:      o Collect and clearly label specimen type:  o Upper respiratory specimen: One Nasopharyngeal Dry Swab NO  Transport Media.  o Hand deliver to laboratory ASAP  Indication for testing->Extended care facility admission to  semi private room    Culture + Gram Lenise Arena, Body Fluid [[id number]] Collected: 05/27/19 0534    Specimen: Body Fluid from Paracentesis Fluid Updated:  06/01/19 1601    Narrative:      [id number] called Micro Results of Gram Stain. Results read back by: [id number], by  [id number] on 05/27/2019 at 14:31  < 22ml  ORDER#: [id number]                                    ORDERED BY: CHUNG, SORA  SOURCE: Paracentesis Fluid peritoneal dialysis cathetCOLLECTED:  05/27/19 05:34  ANTIBIOTICS AT COLL.:                                RECEIVED :  05/27/19 10:09  ORDER ENTRY COMMENTS:  < 39ml  [id number] called Micro Results of Gram Stain. Results read back by: [id number], by [id number] on 05/27/2019 at 14:31  Stain, Gram                                FINAL       05/27/19 14:31  05/27/19   No Squamous epithelial cells seen             No WBCs seen             Rare Gram positive cocci             Stain performed on Cytospin (concentrated) specimen  Culture and Gram Stain, Aerobic, Body FluidFINAL       06/01/19 16:01  05/30/19   The volume of sample received with this order is less than             the recommended volume. Submission of low volumes may             adversely affect recovery of pathogens and culture results             may be compromised.  06/01/19   ** No growth             **  Note:             ** Gram positive cocci seen in Gram Stain did not grow under             ** aerobic and anaerobic culture conditions, possible nonviable.      Fungus culture [[id number]] Collected: 05/27/19 2343    Specimen: Other from Wound Updated: 06/04/19 1529    Narrative:      ORDER#: [id number]                                    ORDERED BY: Kalman Jewels  SOURCE: Wound abdominal wound                        COLLECTED:  05/27/19 23:43  ANTIBIOTICS AT COLL.:                                RECEIVED :  05/28/19 07:56  Stain, Fungal                              FINAL       05/29/19 12:20  05/29/19   No Fungal or Yeast Elements Seen  Culture Fungus                             PRELIM      06/04/19 15:29  06/04/19   No growth after 1 week/s of incubation.      Fungus culture [[id number]] Collected: 05/27/19 0259     Specimen: Other from Drainage Updated: 06/04/19 1529    Narrative:      blue swab  ORDER#: [id number]                                    ORDERED BY: Kalman Jewels  SOURCE: Drainage peritoneal fluid                    COLLECTED:  05/27/19 02:59  ANTIBIOTICS AT COLL.:                                RECEIVED :  05/28/19 07:56  ORDER ENTRY COMMENTS:  blue swab  Stain, Fungal                              FINAL       05/29/19 12:20  05/29/19   No Fungal or Yeast Elements Seen  Culture Fungus                             PRELIM      06/04/19 15:29  06/04/19   No growth after 1 week/s of incubation.      Wound culture & gram stain [[id number]] Collected: 05/27/19 2343    Specimen: Wound Updated: 05/30/19 0829    Narrative:      blue swab  ORDER#: [id number]  ORDERED BY: SANDERS, ENOCH  SOURCE: Wound peritoneal fluid                       COLLECTED:  05/27/19 23:43  ANTIBIOTICS AT COLL.:                                RECEIVED :  05/28/19 07:56  ORDER ENTRY COMMENTS:  blue swab  Stain, Gram                                FINAL       05/28/19 13:06  05/28/19   Few WBCs             Rare Squamous epithelial cells             No organisms seen  Culture and Gram Stain, Aerobic, Wound     FINAL       05/30/19 08:29   +  05/29/19   Light growth of Pseudomonas aeruginosa               Refer to susceptibilities on culture #[id number]        Wound culture & gram stain [[id number]] Collected: 05/27/19 0221    Specimen: Wound Updated: 05/30/19 0828    Narrative:      ORDER#: [id number]                                    ORDERED BY: Kalman Jewels  SOURCE: Wound abdominal wound                        COLLECTED:  05/27/19 02:21  ANTIBIOTICS AT COLL.:                                RECEIVED :  05/28/19 07:56  Stain, Gram                                FINAL       05/28/19 13:03  05/28/19   Moderate WBCs             Few Squamous epithelial cells             No organisms seen  Culture and Gram Stain, Aerobic,  Wound     FINAL       05/30/19 08:28   +  05/30/19   Light growth of Pseudomonas aeruginosa      _____________________________________________________________________________                                  P.aeruginosa    ANTIBIOTICS                     MIC  INTRP      _____________________________________________________________________________  Amikacin                        <=8    S        Aztreonam  8     S        Cefepime                         2     S        Ceftazidime                     <=2    S        Ciprofloxacin                 <=0.25   S        Gentamicin                      <=2    S        Levofloxacin                   <=0.5   S        Meropenem                      <=0.5   S        Piperacillin/Tazobactam         4/4    S        Tobramycin                      <=2    S        _____________________________________________________________________________            S=SUSCEPTIBLE     I=INTERMEDIATE     R=RESISTANT                            N/S=NON-SUSCEPTIBLE  _____________________________________________________________________________             Patient Instructions   Discharge Diet: Heart healthy  Discharge Activity: As tolerated  LABS/TESTING recommended after discharge    Follow Up Appointment:  Follow-up Information     Herbert Moors., MD. Schedule an appointment as soon as possible for a visit in 7 day(s).    Specialty: Surgery  Why: for wound check  Contact information:  [address]  [place] [address]  [phone]             Ulyses Amor, MD. Schedule an appointment as soon as possible for a visit in 1 week(s).    Specialties: Nephrology, Internal Medicine  Contact information:  [address]  [address] [address]  [phone]             Ernst Breach, MD. Schedule an appointment as soon as possible for a visit.    Specialties: Infectious Disease, Internal Medicine  Contact information:  [place] [address]  [phone]             [facility].    Why: SN,PT,OT  Contact information:  [phone]           Pcp, None, MD .                  Time spent examining patient, discussing with patient/family regarding hospital course, chart review, reconciling medications and discharge planning: > 35 minutes.  This patient was examined by me on , the day of discharge.  Signed,  Delfina Redwood    5:35 PM 06/12/2019

## 2019-06-12 NOTE — Plan of Care (Signed)
Problem: Renal Instability  Goal: Fluid and electrolyte balance are achieved/maintained  Flowsheets (Taken 06/12/2019 1100)  Fluid and electrolyte balance are achieved/maintained:   Monitor intake and output every shift   Assess and reassess fluid and electrolyte status   Monitor daily weight   Follow fluid restrictions/IV/PO parameters  Goal: Free from infection  Flowsheets (Taken 06/12/2019 1100)  Free from infection: Monitor/assess for signs and symptoms of infection     Problem: Patient Receiving Advanced Renal Therapies  Goal: Therapy access site remains intact  Flowsheets (Taken 06/12/2019 1100)  Therapy access site remains intact:   Assess therapy access site   Change therapy access site dressing as needed

## 2019-06-12 NOTE — Progress Notes (Addendum)
05/20 @ 1420  Name of DME company: [employer]  Phone: [phone]  Fax: [phone]  Note:  Bedside commode referral e-faxed directly to [employer] pending pt's insurance review and approval.     05/21 Per Tammy from [employer], Bedside commode delivered to pt's home address.

## 2019-06-12 NOTE — Progress Notes (Signed)
Updates as follows.  Pt returned from Onsite Dialysis and D/C Orders were written.  CM made referral to [facility] for Wound Care.  CM also left message for Dialysis Liaison to set up [facility] for Dialysis.  Pt has stated she has not received cleared instructions for her Wound and does not feel comfortable discharging until tomorrow when her daughter can take her home.  CM will continue to follow

## 2019-06-12 NOTE — Progress Notes (Signed)
[facility] communicated CM Michelle to set up PCP app to process HH.

## 2019-06-12 NOTE — PT Progress Note (Signed)
Physical Therapy Note    [facility]  Physical Therapy Treatment    Patient:  [name]  MRN#:  [id number]  Unit:  [address] [facility]  Room/Bed:  C1448/J8563.B    Time of treatment:  Time Calculation  PT Received On: 06/12/19  Start Time: 1400  Stop Time: 1419  Time Calculation (min): 19 min            Chart Review and Collaboration with Care Team: 8 minutes, not included in above time.    PT Visit Number: 5    Precautions:   Precautions  Weight Bearing Status: no restrictions  Precaution Instructions Given to Patient: Yes  Aspiration Precautions: see MD/RN orders  Other Precautions: mod fall risk    Personal Protective Equipment (PPE)  gloves, procedure mask, eye shield/covering and pt wore procedure mask    Updated X-Rays/Tests/Labs:  Lab Results   Component Value Date/Time    HGB 7.9 (L) 06/12/2019 03:41 AM    HCT 26.9 (L) 06/12/2019 03:41 AM    K 3.9 06/12/2019 03:41 AM    NA 137 06/12/2019 03:41 AM       All imaging reviewed, please see chart for details.      Subjective:  Upon entering room pt frantic that she needs to have a BM and get to the bathroom    Patient Goal: Get OP HD setup    Pain Assessment  Pain Assessment: Numeric Scale (0-10)  Pain Score: 6-moderate pain  POSS Score: Awake and Alert  Pain Location: Abdomen  Pain Frequency: Continuous;Increases with movement  Effect of Pain on Daily Activities: mild  Pain Intervention(s): Medication (See eMAR);Repositioned;Ambulation/increased activity;Emotional support         Patient's medical condition is appropriate for Physical Therapy intervention at this time.  Patient is agreeable to participation in the therapy session. Nursing clears patient for therapy.      Objective:  Observation of Patient/Vital Signs:  Vitals:    06/12/19 1310   BP: 141/68   Pulse: 66   Resp: 18   Temp: 97.6 F (36.4 C)   SpO2: 98%         Cognition/Neuro Status  Arousal/Alertness: Appropriate responses to stimuli  Attention Span: Appears  intact  Orientation Level: Oriented X4  Memory: Appears intact  Following Commands: Follows all commands and directions without difficulty  Safety Awareness: moderate verbal instruction  Insights: Fully aware of deficits;Educated in safety awareness  Behavior: cooperative;impulsive(needed to go to the BR)  Motor Planning: intact      Functional Mobility  Supine to Sit: Stand by Assist;Increased Time;Increased Effort;using bedrail;HOB raised  Scooting to EOB: Supervision  Sit to Supine: Supervision(inc effort for BLE)  Sit to Stand: Contact Guard Assist;with instruction for hand placement to increase safety(used rocking motion)  Able to sit <> stand from toilet using grab bar  Stand to Sit: Stand by Assist(Instructions for safe technique)     Locomotion  Ambulation: Contact Guard Assist;Stand by Assist;with front-wheeled walker(20', 120' w/ 3 standing rest breaks)  Pattern: R foot decreased clearance;L foot decreased clearance;decreased cadence;Wide BOS  Distance Walked (ft) (Step 6,7): 120 Feet          Neuro Re-Ed  Sitting Balance: sitting reaching activities;with instruction;without support(unable to reach down to adjust socks)  Standing Balance: standing reaching activities;standing weight shifting all planes;dynamic gait training;with instruction;with support(able to stand w/ 1UE support and wash hands)         Educated the patient to role  of physical therapy, plan of care, goals of therapy and safety with mobility and ADLs with verbalized understanding.    Patient left in bed with all medical equipment in place and call bell and all personal items/needs within reach (of note, pt received without alarm in place).  RN notified of session outcome.      Assessment:  Pt requiring instructions for safety during txfrs and mobility.  Required assistance for hygiene following BM.  Following BR and gt had increased pain and declined further therapy.    Encouraged pt to be OOB in chair as much as possible.  Pt stated  understanding.    [place] recommendations are being updated to home w/ sup and HH OT/PT.    PMP Activity: Step 7 - Walks out of Room  Distance Walked (ft) (Step 6,7): 120 Feet    Plan:  Treatment/Interventions: Exercise, Gait training, LE strengthening/ROM, Endurance training, Bed mobility, Equipment eval/education, Patient/family training      PT Frequency: 2-3x/wk   Continue plan of care.    Goals:  Goals  Goal Formulation: With patient  Time for Goal Acheivement: By time of discharge  Pt Will Go Supine To Sit: modified independent, to maximize functional mobility and independence, Partly met(HOB elevated, use of bedrail, inc time/effort)  Pt Will Perform Sit To Supine: modified independent, to maximize functional mobility and independence, Goal met  Pt Will Perform Sit to Stand: with supervision, to maximize functional mobility and independence, Partly met(SBA)  Pt Will Transfer Bed/Chair: with rolling walker, with supervision, to maximize functional mobility and independence, Goal met  Pt Will Ambulate: 51-100 feet, with rolling walker, with stand by assist, to maximize functional mobility and independence, Partly met(CGA 120' 3 standing rest breaks)        DME Recommended for Discharge: Front wheel walker, [facility]  Discharge Recommendation: Home with supervision, Home with home health PT, Home with home health OT    Iva Lento, Brown  06/12/2019 2:29 PM    Monday - Wednesday 12:30 - 21:00  Thursday - Friday        8:00 - 16:30

## 2019-06-12 NOTE — Progress Notes (Addendum)
Home Health Referral          Referral from [facility] (Case Manager) for home health care upon discharge.    By [employer], the patient has the right to freely choose a home care provider.  Arrangements have been made with:     A company of the patients choosing. We have supplied the patient with a listing of providers in your area who asked to be included and participate in Medicare.   [place], formerly [place], a home care agency that provides adult home care services and participates in Medicare   The preferred provider of your insurance company. Choosing a home care provider other than your insurance company's preferred provider may affect your insurance coverage.      Home Health Discharge Information     Your doctor has ordered Skilled Nursing, Physical Therapy and Occupational Therapy  Wound care and dialysis patient in-home service(s) for you while you recuperate at home, to assist you in the transition from hospital to home.    The agency that you or your representative chose to provide the service:  Name of [place] Placement: [place]:  Name of DME Agency: [facility]](703) [phone]  Equipment Ordered: Bedside Commode will be delivered to patient house.    The above services were set up by:  Cecille Amsterdam  ([place] Liaison)   Phone  [phone]    IF YOU HAVE NOT HEARD [facility] 24-48 HOURS AFTER DISCHARGE PLEASE CALL YOUR AGENCY TO ARRANGE A TIME FOR YOUR FIRST VISIT. FOR ANY SCHEDULING CONCERNS OR QUESTIONS RELATED TO HOME HEALTH, SUCH AS TIME OR DATE PLEASE CONTACT YOUR HOME HEALTH AGENCY AT THE NUMBER LISTED ABOVE.  HOME HEALTH REFERRAL    PATIENT"S DEMOGRAPHICS:        Name: AROURA VASUDEVAN    Discharge Address: [place] [address]      Primary Telephone Number:  [phone]  Secondary Telephone Number:[phone]   Emergency Contact and Number: Extended Emergency Contact Information  Primary Emergency Contact: [place]  Mobile Phone: [phone]  Relation: Daughter  Interpreter needed? No    Ordering Physician:     Following Physician: Ulyses Amor, MD Schedule an appointment as soon as possible for a visit in 1 week(s)   [facility]   [phone]      PCP:  Initial Evaluation with Mitzi Davenport, FNP FNP. Dr Tyrone Sage  Tuesday Jun 17, 2019 11:30 AM (Arrive by 11:15 AM)  Bring all previous medical records and films, along with current medications and insurance information. [place]  [address]   [address] [place] [address]  [phone]       Language/Communication Barrier:      No    Primary Diagnosis and Reason for Services:   Generalized abdominal pain   Vomiting and diarrhea   End stage renal disease on dialysis   Peritoneal dialysis catheter in place   Partial small bowel obstruction     a 64 y.o. female with a PMHx of  hypertension, hyperlipidemia, insulin-dependent diabetes mellitus, COPD, end-stage renal disease on peritoneal dialysis   Recently switched to hemodialysis and discharged yesterday return to the ED with recurrent abdominal pain associated with Non bloody diarrhea, nausea vomiting of nonbilious and nonbloody vomitus.  Patient states that she continued to have  loose watery stools on arriving at home yesterday followed by nausea vomiting 2-3 times, described abdominal pain as left-sided pain intensity greater than 10, pain is worse with palpation.  She denies fever/chills, dysuria, chest pain, shortness of breath, blood in stool or vomitus.  She had dialysis yesterday prior to discharge and have not arranged transportation for outpatient dialysis yet.    In the emergency room, CT abdomen pelvis showed partial small bowel obstruction, trace bilateral pleural effusions, revealed WBC of 15.22, H&H of 10.4/34.1, stool studies pending,  infectious disease Dr. Lahoma Crocker and Surgery Dr. Signa Kell consulted.     [facility] (Labs, Wounds, Infusions, etc.):  Wound care, dialysis patient    Additional Comments:    SARS CoV 2 Overall Result  COVID-19 (SARS-COV-2) ([place])  Collected: 05/27/19 0624   Result status: Final   Resulting lab: [facility]   Value: Negative       Questions Relating to COVID-19:    1. Have you been out of the country in the past 2 weeks?  no  2. Do you have any upper respiratory symptoms (cough, SOB, fever)?  no  3. Have you been exposed to anyone with COVID-19 virus?no    Answer only if pending or positive for COVID-19:    1. Agreeable to wear PPE at each visit?   2.   Is the hospital supplying them with PPE upon discharge?  No    Home Health face-to-face (FTF) Encounter (Order [id number])  Consult  Date: 06/12/2019 Department: [address] [place] Ordering/Authorizing: Delfina Redwood, MD   Order Information    Order Date/Time Release Date/Time Start Date/Time End Date/Time   06/12/19 12:34 PM None 06/12/19 12:31 PM 06/12/19 12:31 PM   Order Details    Frequency Duration Priority Order Class   Once 1 occurrence Routine Hospital Performed   Standing Order Information    Remaining Occurrences Interval Last Released     0/1 Once 06/12/2019           Provider Information    Ordering User Ordering Provider Authorizing Provider   Cecille Amsterdam, RN Delfina Redwood, MD Delfina Redwood, MD   Attending Provider(s) Admitting Provider PCP   Burnett Corrente, MD; Sheppard Coil, MD; Ardeth Sportsman, MD; Delfina Redwood, MD; Murlean Hark, MD Delfina Redwood, MD Pcp, None, MD   Verbal Order Info    Action Created on Order Mode Entered by Responsible Provider Signed by Signed on   Ordering 06/12/19 1234 Telephone with Jacqulyn Liner, RN Delfina Redwood, MD     Comments    Diagnosis   Generalized abdominal pain   Vomiting and diarrhea   End stage renal disease on dialysis   Peritoneal  dialysis catheter in place   Partial small bowel obstruction     S/P Exploratory laparotomy, lysis of intestinal adhesion and removal of peritoneal dialysis catheter/foreign   Object.     Home nursing required for skilled assessment including cardiopulmonary assessment and dietary education for disease management, and medication instruction. Home PT/OT required for gait and balance training, strengthening, mobility, fall prevention, and ADL training.     Wound Care:   Wound Type: Surgical wound   Wound Care to: abdomen   Technique: Clean technique   Clean with (and pat dry): Wound cleanser   Apply: Hydrogel   Apply: Maxorb AG - cut to fit, pack lightly   Apply: Adhesive foam   Change dressing: Three times weekly (M/W/F)  Wound care to abdomen:   1. Cleanse wound with wound cleanser.   2. Apply hydrogel to the wound bed.   3. Maxorb Extra AG (calcium alginate) wound dressing with silver - cut to fit wound.   4. Cover with Adhesive foam dressing or ABD pad and tape to secure.     Maxorb AG dressing helps protect peri-wound skin and reduce maceration. It has absorptive properties that form a cohesive gel when in contact with exudates that provides rapid and sustained antimicrobial activity with ionic silver.         Order Questions    Question Answer Comment   Date I saw the patient face-to-face: 06/12/2019    Evidence this patient is homebound because: B. Profound weakness, poor balance/unsteady gait d/t illness/treatment/procedure     A. Post operative restrictions, weight bearing status impedes mobility > 5 feet     C. Decreased endurance, strength, ROM, cadence, safety/judgment during mobility     D. Shortness of breath even with minimal activity or at rest     E. Requires assistance of another person or assistive device to ambulate > 5 feet     F. Deconditioned due to advance disease process requiring assistance to leave home    Medical conditions that necessitate Home Health care: B. Functional  impairment due to recent hospitalization/procedure/treatment     C. Risk for complication/infection/pain requiring follow up and monitoring     D. Chronic illness & risk for re-hospitalization due to unstable disease status     E. Exacerbation of disease requiring follow up monitoring     F. New diagnosis & treatment requiring follow up monitoring and management     J. Wound requiring assessment, monitoring and care    Per clinical findings, following services are medically necessary: Skilled Nursing     PT     OT    Clinical findings that support the need for Skilled Nursing. SN will: C. Monitor for signs and symptoms of exacerbation of disease and management     D. Review medication reconciliation, manage and educate on use and side effects     G. Educate on new diagnosis, treatment & management to prevent re-hospitalization     H. Assess cardiopulmonary status and monitor for signs &symptoms of exacerbation     K. Educate on complex wound care and management     I. Educate dietary and or fluid restrictions and weight management     A. Educate on post operative care, restrictions & management of complications    Clinical findings that support the need for Physical Therapy. PT will A. Evaluate and treat functional impairment and improve mobility     B. Educate on post-surgical care, restrictions and management of complications     C. Educate on weight bearing status, stair/gait training, balance & coordination     D. Provide services to help restore function, mobility, and releive pain     E. Educate on functional mobility; bed, chair, sit, stand and transfer activities     F. Perform home safety assessment & develop safe in home exercise program     G. Implement activities to improve stance time, cadence & step length     H. Educate on the safe use of assistive device/ durable medical equipment     I. Instruct on restorative activities to restore ability to perform ADL    OT will provide assistance  with: Home program to improve ability to perform ADLs     Recovery and  maintenance skills     Strategies to compensate for loss of function     Restorative program to improve mobility and independence     Basic motor function and reasoning abilities    Other (please specify)  Mitzi Davenport, FNP. Dr Tyrone Sage          Process Instructions    Please select [place] medically necessary.     Based on the above findings, I certify that this patient is confined to the home and needs intermittent skilled nursing care, physical therapry and / or speech therapy or continues to need occupational therapy. The patient is under my care, and I have initiated the establishment of the plan of care. This patient will be followed by a physician who will periodically review the plan of care.    Collection Information    Consult Order Info    ID Description Priority Start Date Start Time   [id number] [place] face-to-face (FTF) Encounter Routine 06/12/2019 12:31 PM   Provider Specialty Referred to   ______________________________________ _____________________________________   Verbal Order Info    Action Created on Order Mode Entered by Responsible Provider Signed by Signed on   Ordering 06/12/19 1234 Telephone with Jacqulyn Liner, RN Delfina Redwood, MD     Patient Information    Patient Name   Kambree, Krauss Legal Sex   Female DOB   1955/10/03   Additional Information    Associated Reports External References   Priority and Order Details [place]        Patient Name: FELEICA, FULMORE     MRN: [id number]     CSN: [id number]       [place] #   [id number] Patient Class   Inpatient Service  Medicine Accommodation Code  Semi-Private     Admission Information    Admitting Physician:  Attending Physician: Delfina Redwood, MD  Delfina Redwood, MD Unit  [address] L&D Status     Admitting Diagnosis: Generalized  abdominal pain; End stage renal disease on dialysis; Partial s* Room / Bed  [phone] L&D  Last Menstrual Cycle     Chief Complaint: Abdominal Pain     Admit Type:  Admit Date/Time:  Discharge Date/Time: Emergency  05/27/2019 / 7893   /  Length of Stay: 14 Days   L&D EDD   Estimated Date of Delivery: None noted.     Patient Information            Home Address: [place] [address] Employer:  Employer Address:     ,     Main Phone: [phone] Employer Phone:    SSN: [id number]     DOB: Jul 20, 1955 (79 yrs)     Sex: Female Primary Care Physician: Pcp, None, MD   Marital Status: Legally Separated Referring Physician:       No ref. 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 Emp Phone:               Engineer, drilling Name: [place] Name: [facility]   Insurance Address:   [place], [place], [employer] Subscriber DOB: [birth year]     Subscriber ID: [id number]   Insurance Phone: [phone] Pt Relationship to Sub:   Self   Insurance ID:      Group Name:  [place] #: pending   Group #: [id number] Preauthorization Days:      Secondary Insurance    Insurance Name: - [place] Name:    Insurance Address:     ,   Veterinary surgeon DOB:      Subscriber ID:    Veterinary surgeon:  Pt Relationship to Sub:      Insurance ID:      Group Name:   Preauthorization #:    Group #:  Preauthorization Days:      [facility] Name: - [place] Name:    Insurance underwriter Address:     ,   Veterinary surgeon DOB:      Subscriber ID:    Veterinary surgeon:  Pt Relationship to Sub:      Insurance ID:      Group Name:  Preauthorization #:    Group #:  Preauthorization Days:        06/12/2019 12:51 PM         Discharge Date: 06/12/19  Referral Source (PACC/Hospital/Unit): Cecille Amsterdam, RN  Referral Date: 06/12/19

## 2019-06-12 NOTE — Progress Notes (Signed)
[facility] PROGRESS NOTE  Aaron Edelman, x [phone] ([place])      Date Time: 06/12/19 6:36 AM  Patient Name: [name]  Attending Physician: Delfina Redwood, MD    CC: follow-up ESRD    Assessment:     1. ESRD- previously on PD; switched to HD on  5/3   2. 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 Anemia due to CKD and iron deficiency - stable on Aranesp  4. Secondary hyperparathyroidism   5. HTN- stable    Recommendations:     HD today  Await surgical decision      Case discussed with: pt    Armando Reichert, MD  [facility]  [phone] (office)  X [phone] (FFX Spectra-Link)    Subjective: Sleeping comfortably    Review of Systems:   Cardio:no chest pain  Pulm:no SOB  DI:YMEBRAXENM at incision site    Physical Exam:     Vitals:    06/11/19 2100 06/11/19 2243 06/12/19 0016 06/12/19 0344   BP: 147/74 135/76 130/74 137/75   Pulse: 72 66  66   Resp:  16  16   Temp:  97.7 F (36.5 C)  97.5 F (36.4 C)   TempSrc:  Oral  Oral   SpO2:  98%  97%   Weight:       Height:           Intake and Output Summary (Last 24 hours) at Date Time    Intake/Output Summary (Last 24 hours) at 06/12/2019 0636  Last data filed at 06/11/2019 2300  Gross per 24 hour   Intake 1080 ml   Output 750 ml   Net 330 ml       General: Sleeping  HEENT: sclera anicteric  Neck: supple  Heart: RRR  Lungs: clear to auscultation bilaterally, bilateral air entry, normal work of breathing  Abd: wound vac in place, tender to palpation  Extremities: improved LE edema      Access R HDTC    Meds:      Scheduled Meds: PRN Meds:    acetaminophen, 1,000 mg, Oral, 4 times per day  amLODIPine, 5 mg, Oral, Q12H  atorvastatin, 40 mg, Oral, Daily  carvedilol, 25 mg, Oral, Q12H SCH  darbepoetin alfa, 60 mcg, Subcutaneous, Weekly  gabapentin, 300 mg, Oral, Daily  heparin (porcine), 5,000 Units, Subcutaneous, Q12H SCH  insulin glargine, 5 Units,  Subcutaneous, Q12H  insulin lispro, 1-8 Units, Subcutaneous, TID AC  lactobacillus/streptococcus, 1 capsule, Oral, Daily  lidocaine, 1 patch, Transdermal, Q24H  neomycin-bacitracin-polymyxin, , Topical, BID  pantoprazole, 40 mg, Oral, QAM AC  senna-docusate, 1 tablet, Oral, BID  sevelamer, 800 mg, Oral, TID MEALS  zinc Oxide, , Topical, Q6H          Continuous Infusions:   sodium chloride 20 mL/hr at 05/27/19 1100    sodium chloride 15 mL/hr at 06/02/19 2057    lactated ringers      sodium chloride, , PRN  bisacodyl, 10 mg, Daily PRN  dextrose, 15 g of glucose, PRN   And  dextrose, 12.5 g, PRN   And  glucagon (rDNA), 1 mg, PRN  dextrose, 15 g of glucose, PRN   And  dextrose, 12.5 g, PRN   And  glucagon (rDNA), 1 mg, PRN  diphenhydrAMINE, 25 mg, Q6H PRN  guaiFENesin-dextromethorphan, 5 mL, Q4H PRN  iohexol, 1,000 mL, ONCE PRN  naloxone, 0.2 mg, PRN  ondansetron, 4 mg, Once PRN  ondansetron, 4 mg, Q6H PRN   Or  ondansetron, 4 mg, Q6H PRN  oxyCODONE, 5 mg, Q6H PRN   Or  oxyCODONE, 10 mg, Q6H PRN  petrolatum, , PRN              Labs:     Recent Labs   Lab 06/12/19  0341 06/10/19  0321 06/08/19  0339   WBC 7.81 7.40 7.77   Hgb 7.9* 7.7* 8.0*   Hematocrit 26.9* 26.6* 27.6*   Platelets 151 157 145     Recent Labs   Lab 06/12/19  0341 06/10/19  0321 06/08/19  0339   Sodium 137 133* 136   Potassium 3.9 4.1 4.1   Chloride 102 99* 99*   CO2 22 23 25    BUN 41.0* 44.0* 24.0*   Creatinine 5.6* 6.5* 4.1*   Calcium 7.9* 7.9* 7.9*   Glucose 210* 288* 216*   EGFR 9.2 7.8 13.2         Signed by: Armando Reichert, MD

## 2019-06-12 NOTE — Progress Notes (Signed)
Hands off report received from [name]. RN; Pt. Arrived to HD unit a/ox3; c/o slight soreness on her surg. Site - tolerable. VSS; Rt. CVC w/ good flow.   06/12/19 0915   Bedside Nurse Communication   Name of bedside RN - pre dialysis [name]   Treatment Initiation- With Dialysis Precautions   Time Out/Safety Check Completed Yes   Consent for HD signed for this hospitalization Y   Blood Consent Verified N/A   Dialysis Precautions All Connections Secured   Dialysis Treatment Type Routine   Is patient diabetic? Yes   RO/Hemodialysis Architectural technologist   Is Total Chlorine less than 0.1 ppm? Yes   Orignial Total Chlorine Testing Time 0915   At 4 Hour Total Chlorine Testing Time 1315   RO/Hemodialysis Facilities manager Number 2   RO # 25   Water Hardness 0   pH 7   Pressure Test Verified Yes   Alarms Verified Passed   Machine Temperature 96 F (35.6 C)   Alarms Verified Yes   Na+ mEq (Machine) 138 mEq   Bicarb mEq (Machine) 35 mEq   Hemodialysis Conductivity (Machine) 13.7   Hemodialysis Conductivity (Meter) 14   Dialyzer Lot Number [id number]   Tubing Lot Number [id number]   RO Machine Log Completed Yes   Hepatitis Status   HBsAg (Antigen) Result Negative   HBsAg Date Drawn 06/12/19   HBsAg Repeat Draw Due Date 07/10/19   Dialysis Weight   Pre-Treatment Weight (Kg) 109.4   Scale Type ICU Bed Scale   Vitals   Temp 98 F (36.7 C)   Heart Rate 62   Resp Rate 18   BP 153/66   SpO2 98 %   O2 Device None (Room air)   Assessment   Mental Status Alert;Oriented;Cooperative   Cardiac (WDL) WDL   Cardiac Regularity Regular   Cardiac Symptoms None   Cardiac Rhythm Normal Sinus Rhythm   Respiratory  WDL   Respiratory Pattern Regular   Bilateral Breath Sounds Clear   R Breath Sounds Clear   L Breath Sounds Clear   Cough Spontaneous   Edema  WDL   Generalized Edema None   Facial None   Sacral UTA   RLE Edema Non Pitting Edema   LLE Edema Non Pitting Edema   General Skin Color Appropriate for ethnicity    Skin Condition/Temp Warm;Dry   Gastrointestinal (WDL) WDL   Abdomen Inspection Soft;Rounded;Other (Comment)  (Surgical site - dressing intact.)   GI Symptoms None   Mobility Ambulatory with Assistance   Permacath/Temporary Catheter 05/23/19 Permacath Right   Placement Date/Time: 05/23/19 1342   Inserted by: Papadouris  Access Type: Permacath  Orientation: Right  Access Location: Tunneled, Chest  Catheter Tip Location: svc  Central Line Infection Prevention Education provided?: Yes;No  Hand Hygiene: [place]...   Dressing Status and Intervention Dressing Intact   Pain Assessment   Charting Type Assessment   Pain Scale Used Numeric Scale (0-10)   Numeric Pain Scale   Pain Score 3   POSS Score 1   Pain Location Abdomen   Pain Orientation Left;Anterior   Pain Descriptors Sore   Pain Frequency Increases with movement   Effect of Pain on Daily Activities mild   Patient's Stated Comfort Functional Goal 0   Pain Intervention(s) Distraction   Multiple Pain Sites No   Hemodialysis Comments   Pre-Hemodialysis Comments Timeout  safety check- done   Hemodialysis History Information   Outpatient Nephrologist Dr.  Lorica

## 2019-06-12 NOTE — Plan of Care (Signed)
Problem: Safety  Goal: Patient will be free from injury during hospitalization  Outcome: Progressing  Flowsheets (Taken 06/12/2019 1947)  Patient will be free from injury during hospitalization:   Assess patient's risk for falls and implement fall prevention plan of care per policy   Provide and maintain safe environment   [place] rounding   Ensure appropriate safety devices are available at the bedside  Goal: Patient will be free from infection during hospitalization  Outcome: Progressing  Flowsheets (Taken 06/12/2019 1947)  Free from Infection during hospitalization:   Assess and monitor for signs and symptoms of infection   Monitor all insertion sites (i.e. indwelling lines, tubes, urinary catheters, and drains)   Encourage patient and family to use good hand hygiene technique     Problem: Pain  Goal: Pain at adequate level as identified by patient  Outcome: Progressing  Flowsheets (Taken 06/12/2019 1947)  Pain at adequate level as identified by patient:   Identify patient comfort function goal   Reassess pain within 30-60 minutes of any procedure/intervention, per Pain Assessment, Intervention, Reassessment (AIR) Cycle   Assess for risk of opioid induced respiratory depression, including snoring/sleep apnea. Alert healthcare team of risk factors identified.   Offer non-pharmacological pain management interventions   Evaluate if patient comfort function goal is met     Problem: Altered GI Function  Goal: Elimination patterns are normal or improving  Outcome: Progressing  Flowsheets (Taken 06/12/2019 1947)  Elimination patterns are normal or improving:   Assess for normal bowel sounds   Monitor for abdominal distension   Monitor for abdominal discomfort   Assess for signs and symptoms of bleeding.  Report signs of bleeding to physician   Administer treatments as ordered  Goal: Nutritional intake is adequate  Outcome: Progressing  Flowsheets (Taken 06/12/2019 1947)  Nutritional intake is adequate:   Monitor daily  weights   Allow adequate time for meals   Assist patient with meals/food selection   Consult/collaborate with Clinical Nutritionist   Assess anorexia, appetite, and amount of meal/food tolerated  Goal: Mobility/Activity is maintained at optimal level for patient  Outcome: Progressing  Flowsheets (Taken 06/12/2019 1947)  Mobility/activity is maintained at optimal level for patient:   Increase mobility as tolerated/progressive mobility   Maintain proper body alignment   Perform active/passive ROM   Reposition patient every 2 hours and as needed unless able to reposition self     Problem: Renal Instability  Goal: Fluid and electrolyte balance are achieved/maintained  Outcome: Progressing  Flowsheets (Taken 06/12/2019 1947)  Fluid and electrolyte balance are achieved/maintained:   Monitor intake and output every shift   Monitor daily weight   Provide adequate hydration   Monitor/assess lab values and report abnormal values   Assess and reassess fluid and electrolyte status   Observe for seizure activity and initiate seizure precautions if indicated   Observe for cardiac arrhythmias   Pt AOx4. On room air. Denies shortness of breath, nausea and vomiting. Pt complaint of pain relieved by Oxycodone x1 and scheduled tylenol with helpful relief. Scheduled meds are given. On accucheck, insulin coverage given in the evening. Pt has good appetite. Pt ambulates to the bathroom using walker with standby assist. Pt has diminished urine production. BM noted during the shift. Pt had hemodialysis today. Call bell and personal belongings are within reach.

## 2019-06-12 NOTE — Progress Notes (Addendum)
[place]        Patient Name: [name], [name]     MRN: [id number]     CSN: [id number]       [place] #   [id number] Patient Class   Inpatient Service  Medicine Accommodation Code  Semi-Private     Admission Information    Admitting Physician:  Attending Physician: Delfina Redwood, MD  Delfina Redwood, MD Unit  [address] [facility] L&D Status     Admitting Diagnosis: Generalized abdominal pain; End stage renal disease on dialysis; Partial s* Room / Bed  [phone] L&D  Last Menstrual Cycle     Chief Complaint: Abdominal Pain     Admit Type:  Admit Date/Time:  Discharge Date/Time: Emergency  05/27/2019 / 0479   /  Length of Stay: 14 Days   L&D EDD   Estimated Date of Delivery: None noted.     Patient Information            Home Address: [place] [address] Employer:  Employer Address:     ,     Main Phone: [phone] Employer Phone:    SSN: [id number]     DOB: 03/02/1955 (41 yrs)     Sex: Female Primary Care Physician: Pcp, None, MD   Marital Status: Legally Separated Referring Physician:       No ref. provider found   Race: Black or African American     Ethnicity: Non Hispanic/Latino     Emergency Contacts  Name Home Phone Work Phone Mobile Phone Relationship Lgl Leafy Half   [phone] Daughter No        Guarantor Information    Guarantor Name: SHABREA, WELDIN Guarantor ID: [id number]   Guarantor Relationship to Pt: Self Guarantor Type: Personal/Family   Guarantor DOB:   12/22/1955     Guarantor Address: [address] [place], [place] [address]       Guarantor Home Phone: [phone] Guarantor Employer:        Guarantor Work Phone:  Special educational needs teacher Emp Phone:               Engineer, drilling Name: [place] Name: [facility]   Insurance Address:   [place], [place], [place] Subscriber  DOB: October 05, 1955     Subscriber ID: [id number]   Insurance Phone: [phone] Pt Relationship to Sub:   Self   Insurance ID:      Group Name:  Preauthorization #: pending   Group #: [id number] Preauthorization Days:      Secondary Insurance    Insurance Name: - [place] Name:    Insurance Address:     ,   Veterinary surgeon DOB:      Subscriber ID:    Veterinary surgeon:  Pt Relationship to Sub:      Insurance ID:      Group Name:  Fish farm manager #:    Group #:  Preauthorization Days:      [facility] Name: Radiation protection practitioner Name:    Nutritional therapist:     ,   Veterinary surgeon DOB:      Subscriber ID:    Veterinary surgeon:  Pt Relationship to Sub:      Insurance ID:      Group Name:  Preauthorization #:    Group #:  Preauthorization Days:        06/12/2019 2:10 PM         Home Health face-to-face (FTF) Encounter (Order [id number])  Consult  Date: 06/12/2019 Department: [address] [address] [facility] Released By: Cecille Amsterdam, RN (auto-released) Authorizing: Delfina Redwood, MD   Order Information    Order Date/Time Release Date/Time Start Date/Time End Date/Time   06/12/19 01:50 PM 06/12/19 01:50 PM 06/12/19 01:50 PM 06/12/19 01:50 PM   Order Details    Frequency Duration Priority Order Class   Once 1 occurrence Routine Hospital Performed   Provider Information    Ordering User Ordering Provider Authorizing Provider   Cecille Amsterdam, RN Delfina Redwood, MD Delfina Redwood, MD   Attending Provider(s) Admitting Provider PCP   Burnett Corrente, MD; Sheppard Coil, MD; Ardeth Sportsman, MD; Delfina Redwood, MD; Murlean Hark, MD Delfina Redwood, MD Pcp, None, MD   Verbal Order Info    Action Created on Order Mode Entered by Responsible Provider Signed by Signed on   Ordering 06/12/19 1350 Telephone with Jacqulyn Liner, RN Delfina Redwood, MD Delfina Redwood, MD 06/12/19 1457   Comments    Bedside Commode needed >99 months, NPI:  [id number]   Ht: 1.575 m (5\' 2" )   Wt: 119 kg (262 lb 5.6 oz)   Bowel incontinence (Chronic) R15.9         Order Questions    Question Answer Comment   Date I saw the patient face-to-face: 06/12/2019    Evidence this patient is homebound because: O. N/A DME only    Medical conditions that necessitate Home Health care: M. N/A DME only. No skilled services needed    Per clinical findings, following services are medically necessary: DME    DME Bedside Commode    Other (please specify) TCM          Process Instructions    Please select Home Care Services medically necessary.     Based on the above findings, I certify that this patient is confined to the home and needs intermittent skilled nursing care, physical therapry and / or speech therapy or continues to need occupational therapy. The patient is under my care, and I have initiated the establishment of the plan of care. This patient will be followed by a physician who will periodically review the plan of care.    Collection Information    Consult Order Info    ID Description Priority Start Date Start Time   [id number] [place] face-to-face (FTF) Encounter Routine 06/12/2019 1:50 PM   Provider Specialty Referred to   ______________________________________ _____________________________________   Verbal Order Info    Action Created on Order Mode Entered by Responsible Provider Signed by Signed on   Ordering 06/12/19 1350 Telephone with Jacqulyn Liner, RN Delfina Redwood, MD Delfina Redwood, MD 06/12/19 1457   Completion Info    User Date/Time   Cecille Amsterdam, RN 06/12/19 1350   Patient Information    Patient Name   [name] Legal Sex   Female DOB   1955/04/08   Additional Information    Associated Reports External References   View Parent Encounter [facility]   Priority and Order Details

## 2019-06-12 NOTE — Plan of Care (Signed)
Discharge Recommendation: Home with supervision, Home with home health PT, Home with home health OT  DME Recommended for Discharge: Front wheel walker, BSC    Is an Occupational Therapy Evaluation Indicated at this time? This patient is already on OT caseload.     Treatment/Interventions: Exercise, Gait training, LE strengthening/ROM, Endurance training, Bed mobility, Equipment eval/education, Patient/family training  PT Frequency: 2-3x/wk     PMP Activity: Step 7 - Walks out of Room  Distance Walked (ft) (Step 6,7): 120 Feet  (Please See Therapy Evaluation for device and assistance level needed)    Goals:   Goals  Goal Formulation: With patient  Time for Goal Acheivement: By time of discharge  Pt Will Go Supine To Sit: modified independent, to maximize functional mobility and independence, Partly met(HOB elevated, use of bedrail, inc time/effort)  Pt Will Perform Sit To Supine: modified independent, to maximize functional mobility and independence, Goal met  Pt Will Perform Sit to Stand: with supervision, to maximize functional mobility and independence, Partly met(SBA)  Pt Will Transfer Bed/Chair: with rolling walker, with supervision, to maximize functional mobility and independence, Goal met  Pt Will Ambulate: 51-100 feet, with rolling walker, with stand by assist, to maximize functional mobility and independence, Partly met(CGA 120' 3 standing rest breaks)

## 2019-06-13 LAB — GLUCOSE WHOLE BLOOD - POCT
Whole Blood Glucose POCT: 213 mg/dL — ABNORMAL HIGH (ref 70–100)
Whole Blood Glucose POCT: 211 mg/dL — ABNORMAL HIGH (ref 70–100)

## 2019-06-13 MED ORDER — ALBUMIN HUMAN 25 % IV SOLN
100.0000 mL | INTRAVENOUS | Status: DC | PRN
Start: 2019-06-13 — End: 2019-06-13
  Filled 2019-06-13: qty 100

## 2019-06-13 NOTE — Progress Notes (Signed)
[facility] PROGRESS NOTE  Aaron Edelman, x [phone] [facility] Spectralink)      Date Time: 06/13/19 10:24 AM  Patient Name: [name]  Attending Physician: Delfina Redwood, MD    CC: follow-up ESRD    Assessment:     1. ESRD- previously on PD; switched to HD on  5/3   2. 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 Anemia due to CKD and iron deficiency - stable on Aranesp  4. Secondary hyperparathyroidism   5. HTN- stable    Recommendations:     Notified that she won't be able to be dialyzed until Monday at [facility] HD unit.   Plan for HD again today around 1:30PM to 2PM for 3hrl; she is ok with being discharged later this afternoon      Case discussed with: pt, HD nurse, and hospitalist via Epic chat    Verl Blalock, MD  [facility]  [phone] (office)  X [phone] ([place])    Subjective: awake,alert, eager to be discharged     Review of Systems:   Cardio:no chest pain  Pulm:no SOB  [id number] at incision site    Physical Exam:     Vitals:    06/13/19 0252 06/13/19 0332 06/13/19 0724 06/13/19 0919   BP: 146/72 133/75 139/62 118/73   Pulse:  (!) 59 60 65   Resp:  18 18    Temp:  97.7 F (36.5 C) 98.4 F (36.9 C)    TempSrc:  Oral Oral    SpO2:  97% 97%    Weight:  119 kg (262 lb 5.6 oz)     Height:           Intake and Output Summary (Last 24 hours) at Date Time    Intake/Output Summary (Last 24 hours) at 06/13/2019 1024  Last data filed at 06/13/2019 0400  Gross per 24 hour   Intake 730 ml   Output 3000 ml   Net -2270 ml       General: alert and awake  HEENT: sclera anicteric  Neck: supple  Heart: RRR  Lungs: clear to auscultation bilaterally, bilateral air entry, normal work of breathing  Abd: wound vac in place, tender to palpation  Extremities: improved LE edema      Access R HDTC    Meds:      Scheduled Meds: PRN Meds:    acetaminophen, 1,000 mg, Oral, 4 times per day  amLODIPine,  5 mg, Oral, Q12H  atorvastatin, 40 mg, Oral, Daily  carvedilol, 25 mg, Oral, Q12H SCH  darbepoetin alfa, 60 mcg, Subcutaneous, Weekly  gabapentin, 300 mg, Oral, Daily  heparin (porcine), 5,000 Units, Subcutaneous, Q12H [place]  insulin glargine, 5 Units, Subcutaneous, Q12H  insulin lispro, 1-8 Units, Subcutaneous, TID AC  lactobacillus/streptococcus, 1 capsule, Oral, Daily  lidocaine, 1 patch, Transdermal, Q24H  neomycin-bacitracin-polymyxin, , Topical, BID  pantoprazole, 40 mg, Oral, QAM AC  senna-docusate, 1 tablet, Oral, BID  sevelamer, 800 mg, Oral, TID MEALS  zinc Oxide, , Topical, Q6H          Continuous Infusions:   sodium chloride 20 mL/hr at 05/27/19 1100    sodium chloride 15 mL/hr at 06/02/19 2057    lactated ringers      sodium chloride, , PRN  bisacodyl, 10 mg, Daily PRN  dextrose, 15 g of glucose, PRN   And  dextrose, 12.5 g, PRN  And  glucagon (rDNA), 1 mg, PRN  dextrose, 15 g of glucose, PRN   And  dextrose, 12.5 g, PRN   And  glucagon (rDNA), 1 mg, PRN  diphenhydrAMINE, 25 mg, Q6H PRN  guaiFENesin-dextromethorphan, 5 mL, Q4H PRN  iohexol, 1,000 mL, ONCE PRN  naloxone, 0.2 mg, PRN  ondansetron, 4 mg, Once PRN  ondansetron, 4 mg, Q6H PRN   Or  ondansetron, 4 mg, Q6H PRN  oxyCODONE, 5 mg, Q6H PRN   Or  oxyCODONE, 10 mg, Q6H PRN  petrolatum, , PRN              Labs:     Recent Labs   Lab 06/12/19  0341 06/10/19  0321 06/08/19  0339   WBC 7.81 7.40 7.77   Hgb 7.9* 7.7* 8.0*   Hematocrit 26.9* 26.6* 27.6*   Platelets 151 157 145     Recent Labs   Lab 06/12/19  0341 06/10/19  0321 06/08/19  0339   Sodium 137 133* 136   Potassium 3.9 4.1 4.1   Chloride 102 99* 99*   CO2 22 23 25    BUN 41.0* 44.0* 24.0*   Creatinine 5.6* 6.5* 4.1*   Calcium 7.9* 7.9* 7.9*   Glucose 210* 288* 216*   EGFR 9.2 7.8 13.2         Signed by: Verl Blalock, MD

## 2019-06-13 NOTE — Plan of Care (Signed)
Problem: Renal Instability  Goal: Fluid and electrolyte balance are achieved/maintained  Outcome: Progressing  Flowsheets (Taken 06/13/2019 1453)  Fluid and electrolyte balance are achieved/maintained:   Monitor/assess lab values and report abnormal values   Assess and reassess fluid and electrolyte status  Goal: Free from infection  Outcome: Progressing  Flowsheets (Taken 06/13/2019 1453)  Free from infection: Monitor/assess for signs and symptoms of infection     Problem: Patient Receiving Advanced Renal Therapies  Goal: Therapy access site remains intact  Outcome: Progressing  Flowsheets (Taken 06/13/2019 1453)  Therapy access site remains intact:   Assess therapy access site   Change therapy access site dressing as needed

## 2019-06-13 NOTE — Plan of Care (Signed)
Patient [name], VSS complained of pain medicated as scheduled. On RA, no SOB, denies chest pain, no fever , nausea or vomiting noted. All medications given as ordered. Able to ambulate  to the bathroom with a walker and standby assist. Tele monitoring in place, noted with NSR on the monitor. Acucheck done, insulin administered as ordered.   Fall bundle in place, encouraged patient to call for assistance as needed.   Purposeful rounding done.    Problem: Safety  Goal: Patient will be free from infection during hospitalization  Flowsheets (Taken 06/13/2019 0821)  Free from Infection during hospitalization:   Assess and monitor for signs and symptoms of infection   Monitor lab/diagnostic results   Monitor all insertion sites (i.e. indwelling lines, tubes, urinary catheters, and drains)   Encourage patient and family to use good hand hygiene technique     Problem: Pain  Goal: Pain at adequate level as identified by patient  Flowsheets (Taken 06/13/2019 0821)  Pain at adequate level as identified by patient:   Identify patient comfort function goal   Assess for risk of opioid induced respiratory depression, including snoring/sleep apnea. Alert healthcare team of risk factors identified.   Assess pain on admission, during daily assessment and/or before any "as needed" intervention(s)   Reassess pain within 30-60 minutes of any procedure/intervention, per Pain Assessment, Intervention, Reassessment (AIR) Cycle   Evaluate if patient comfort function goal is met   Evaluate patient's satisfaction with pain management progress     Problem: Renal Instability  Goal: Fluid and electrolyte balance are achieved/maintained  Flowsheets (Taken 06/13/2019 0821)  Fluid and electrolyte balance are achieved/maintained:   Monitor intake and output every shift   Monitor/assess lab values and report abnormal values   Provide adequate hydration   Assess for confusion/personality changes   Monitor daily weight   Assess and reassess fluid and  electrolyte status   Patient [name], VSS complained of pain medicated as scheduled. Pt on RA, no SOB, denies chest pain, no fever , nausea or vomiting noted. All medications given as ordered. Able to ambulate  to hte bathroom with a walker and standby assist. Tele monitoring in place, noted with NSR on the monitor. Acucheck done, insulin administered as ordered.   Fall bundle in place, encouraged patient to call for assistance as needed.   Purposeful rounding done.

## 2019-06-13 NOTE — OT Progress Note (Signed)
Occupational Therapy Note  [facility]   Occupational Therapy Attempt Note    Patient:  [name] MRN#:  [id number]  Unit:  [address] [address] [place] Room/Bed:  M2500/B7048.B      Occupational Therapy treatment attempted on 06/13/2019 at 3:34 PM unable to complete secondary to patient off floor for dialysis. Therapist will reattempt as able and appropriate.     RN aware.  Thia Olesen, OTR/L  06/13/2019  3:35 PM

## 2019-06-13 NOTE — Progress Notes (Signed)
GENERAL SURGERY PROGRESS NOTE  [facility] [phone]    Date Time: 06/13/19 6:45 AM  Patient Name: [name] Day: 15    ASSESSMENT:     Patient Active Problem List   Diagnosis    Iron deficiency anemia secondary to inadequate dietary iron intake    Sideropenic dysphagia    Peritoneal dialysis catheter dysfunction, initial encounter    Peritonitis    Upper GI bleed    ESRD (end stage renal disease) on dialysis    Generalized abdominal pain    Thrombocytopenia    Hypotension    Type 2 diabetes mellitus, with long-term current use of insulin    GERD (gastroesophageal reflux disease)    Bowel incontinence     POD15 from PD cath removal    PLAN:   Dressing changes per WOCN recommendations-> change scheduled for today with three times weekly with [facility] wound care upon discharge  Ok to Ong from surgery perspective, f/up with Dr. Baird Cancer in 1-2 weeks for wound check.    This patient was personally seen and examined by me and I agree with the assessment and plan above.  All notes, labs, and xrays were reviewed.  The patient was seen on the date the note was written.        Iran Sizer , MD [facility]  [facility]  [address] [place], [place] [address]  [phone]          SUBJECTIVE:   The patient is doing well.  Abdominal pain is  Decreased.  Current dietary status:  Glucerna Supplement Quantity: A. One; Flavor: Strawberry; Frequency: Daily with lunch  Diet consistent carbohydrate and renal Protein restriction: 80 GM Protein  Supervise For Meals Frequency: All meals and tolerating well.  Flatus: yes. BM:  yes.  Additional complaints: none     Objective:   Current Vitals:   Vitals:    06/13/19 0332   BP: 133/75   Pulse: (!) 59   Resp: 18   Temp: 97.7 F (36.5 C)   SpO2: 97%       Intake and Output Summary (Last 24 hours):  I/O last 3 completed shifts:  In: 1800 [P.O.:1800]  Out: 3750 [Urine:750; Other:3000]    Labs:     Results       Procedure Component Value Units Date/Time     Culture + Gram Stain,Aerobic, Wound [[id number]] Collected: 06/09/19 1119    Specimen: Wound Updated: 06/12/19 2232    Narrative:      ORDER#: [id number]                                    ORDERED BY: Gennaro Africa  SOURCE: Wound Abdominal wound                        COLLECTED:  06/09/19 11:19  ANTIBIOTICS AT COLL.:                                RECEIVED :  06/09/19 15:00  Stain, Gram                                FINAL       06/09/19 16:27  06/09/19   No Squamous epithelial cells  seen             Few WBCs             No organisms seen  Culture and Gram Stain, Aerobic, Wound     PRELIM      06/12/19 22:31  06/10/19   Culture no growth to date, Final report to follow  06/11/19   Culture requires further incubation, results to follow  06/12/19   Culture requires further incubation, results to follow      Glucose Whole Blood - POCT [[id number]]  (Abnormal) Collected: 06/12/19 2119     Updated: 06/12/19 2122     Whole Blood Glucose POCT 227 mg/dL     Glucose Whole Blood - POCT [[id number]]  (Abnormal) Collected: 06/12/19 1602     Updated: 06/12/19 1709     Whole Blood Glucose POCT 229 mg/dL     Hepatitis B (HBV) Surface Antigen [[id number]] Collected: 06/12/19 1007    Specimen: Blood Updated: 06/12/19 1459     Hepatitis B Surface Antigen Non-Reactive    Hemolysis index [[id number]] Collected: 06/12/19 1007     Updated: 06/12/19 1430     Hemolysis Index 12    Glucose Whole Blood - POCT [[id number]]  (Abnormal) Collected: 06/12/19 1345     Updated: 06/12/19 1348     Whole Blood Glucose POCT 171 mg/dL             Rads:     Radiology Results (24 Hour)       ** No results found for the last 24 hours. **              Medications:     Current Facility-Administered Medications   Medication Dose Route Frequency    acetaminophen  1,000 mg Oral 4 times per day    amLODIPine  5 mg Oral Q12H    atorvastatin  40 mg Oral Daily    carvedilol  25 mg Oral Q12H SCH    darbepoetin alfa  60 mcg Subcutaneous Weekly    gabapentin  300 mg Oral  Daily    heparin (porcine)  5,000 Units Subcutaneous Q12H [place]    insulin glargine  5 Units Subcutaneous Q12H    insulin lispro  1-8 Units Subcutaneous TID AC    lactobacillus/streptococcus  1 capsule Oral Daily    lidocaine  1 patch Transdermal Q24H    neomycin-bacitracin-polymyxin   Topical BID    pantoprazole  40 mg Oral QAM AC    senna-docusate  1 tablet Oral BID    sevelamer  800 mg Oral TID MEALS    zinc Oxide   Topical Q6H      sodium chloride 20 mL/hr at 05/27/19 1100    sodium chloride 15 mL/hr at 06/02/19 2057    lactated ringers       sodium chloride, bisacodyl, Nursing communication: Adult Hypoglycemia Treatment Algorithm **AND** dextrose **AND** dextrose **AND** glucagon (rDNA), Nursing communication: Adult Hypoglycemia Treatment Algorithm **AND** dextrose **AND** dextrose **AND** glucagon (rDNA), diphenhydrAMINE, guaiFENesin-dextromethorphan, iohexol, naloxone, ondansetron, ondansetron **OR** ondansetron, oxyCODONE **OR** oxyCODONE, petrolatum     Physical Exam:     General appearance - alert, well appearing, and in no distress  Mental status - normal mood, behavior, speech, dress, motor activity, and thought processes  Chest - clear to auscultation, no wheezes, rales or rhonchi, symmetric air entry  Heart - normal rate and regular rhythm  Abdomen - soft, mildly tender around the wound, non-distended, no rebound/guarding  Wound - no erythema  or induration, wound minimal fibrinous exudate along edges with packing in place  Extremities - no pedal edema noted    Signed by: Sharon Seller

## 2019-06-13 NOTE — Progress Notes (Signed)
Completed 3 hours of HD. 2.5L UF tolerated well. Report given to April RN.     06/13/19 1745   Treatment Summary   Time Off Machine 1730   Duration of Treatment (Hours) 3.5   Dialyzer Clearance Moderately streaked   Fluid Volume Off (mL) 3000   Prime Volume (mL) 200   Rinseback Volume (mL) 200   Fluid Given: Normal Saline (mL) 100   Fluid Given: PRBC  0 mL   Fluid Given: Albumin (mL) 0   Fluid Given: Other (mL) 0   Total Fluid Given 500   Hemodialysis Net Fluid Removed 2500   Post Treatment Assessment   Post-Treatment Weight (Kg) 116.5   Patient Response to Treatment tolerated well   Permacath/Temporary Catheter 05/23/19 Permacath Right   Placement Date/Time: 05/23/19 1342   Inserted by: Papadouris  Access Type: Permacath  Orientation: Right  Access Location: Tunneled, Chest  Catheter Tip Location: svc  Central Line Infection Prevention Education provided?: Yes;No  Hand Hygiene: [place]...   Catheter Lumen Volume Venous 1.7 mL   Catheter Lumen Volume Arterial 1.7 mL   Dressing Status and Intervention Dressing Intact   Tego Caps on Catheter No   NEW Tego Caps placed (Date) 06/10/19   Vitals   Temp 98.2 F (36.8 C)   Heart Rate 65   Resp Rate 18   BP 149/65   SpO2 98 %   O2 Device None (Room air)   Assessment   Mental Status Alert;Oriented;Cooperative   Cardiac (WDL) WDL   Cardiac Regularity Regular   Cardiac Symptoms None   Cardiac Rhythm Normal Sinus Rhythm   Respiratory  WDL   Respiratory Pattern Regular   Bilateral Breath Sounds Clear;Diminished   Edema  WDL   Generalized Edema None   Facial None   Sacral UTA   RLE Edema Non Pitting Edema   LLE Edema Non Pitting Edema   General Skin Color Appropriate for ethnicity   Skin Condition/Temp Warm;Dry   Gastrointestinal (WDL) WDL   Abdomen Inspection Nondistended   GI Symptoms None   Mobility Ambulatory with Assistance   Pain Assessment   Charting Type Assessment   Pain Scale Used Numeric Scale (0-10)   Numeric Pain Scale   Pain Score 0   POSS Score 1   Education    Person taught Patient   Knowledge basis Minimal   Topics taught Fluid Management;Procedure   Teaching Tools Explain   [name] Understanding   Bedside Nurse Communication   Name of bedside RN - post dialysis [name]

## 2019-06-13 NOTE — Progress Notes (Signed)
Discharged home with all personal belongings. Discharge instructions given, including copy of wound care instructions. Patient was picked up by daughter. Condition stable at discharge.

## 2019-06-13 NOTE — Progress Notes (Signed)
Nutritional Support Services  Nutrition Note    [name] 64 y.o. female   MRN: [id number]      Reason for Referral: F/u    Diet: Consistent CHO and Renal    Clinical Nutrition Summary: Pt seen as follow up. Continues to eat well, consistently consuming 75-100% of meals. No reports of N/V/D/C. No issues reported at this time. Plan for d/c later today after HD.      No nutrition diagnosis at this time.      Patient is currently at low nutritional risk; will be available within 10 days and PRN.      Edsel Petrin MS, RDN, [place]  Clinical Dietitian  [phone]

## 2019-06-13 NOTE — Progress Notes (Addendum)
D/C update from hosp CC, Sharyn Lull - pt is planned to d/c home today, will resume OP HD at [facility]. Clinic has moved pt's appt from TTS to  MWF 1pm and will see patient on Monday  Secure chat to Dr. Shaune Pascal to get OK on Monday [place], [place] Coordinator  [phone]

## 2019-06-13 NOTE — Progress Notes (Signed)
Pt will D/C home with Home Health RN, PT/OT.  Dialysis will resume with [facility] beginning on Monday May 24@ 1:00 pm.  Pt arranged Transport with [facility] for a pick up at 12:00 noon.  Bedside RN will change wound dressing before d/c and Pt will receive Dialysis at Hospital prior to D/C.  Pt's Daughter will pick up pt at 5:00 p.m.  [facility] will follow up with pt to schedule Home Visits.  Bedside RN made aware of the above.  No further needs at this time.           06/13/19 1247   Discharge Disposition   Patient preference/choice provided? Yes   Physical Discharge Disposition Home, Home Health   Mode of Transportation Car   Patient/Family/POA notified of transfer plan Yes   Patient agreeable to discharge plan/expected d/c date? Yes   Family/POA agreeable to discharge plan/expected d/c date? Yes   Bedside nurse notified of transport plan? Yes   [facility];Home PT/OT/ST   Services Other (comment)   CM Interventions   Follow up appointment scheduled? Yes  ([facility] @9 :45 AM)   Is this a Medicare focused or COVID patient? No   Is this appointment within 48-72 hours? No   Referral made for home health RN visit? Yes   Multidisciplinary rounds/family meeting before d/c? Yes   Medicare Checklist   Is this a Medicare patient? No   Patient received 1st IMM Letter? No

## 2019-06-13 NOTE — Progress Notes (Signed)
Patient alert, oriented x 3. Denies any pain. Vital signs within normal range. Patient dialyzed yesterday. Dialysis again today due to days change in her outpatient HD schedule. For possible d/c post HD. Will monitor closely during dialysis.     06/13/19 1420   Bedside Nurse Communication   Name of bedside RN - pre dialysis [name]   Treatment Initiation- With Dialysis Precautions   Time Out/Safety Check Completed Yes   Consent for HD signed for this hospitalization Y   Blood Consent Verified N/A   Dialysis Precautions All Connections Secured;Saline Line Double Clamped;Venous Parameters Set;Arterial Parameters Set;Air Foam Detecctor Engaged   Dialysis Treatment Type Routine   Special Considerations for possible d/c home   Is patient diabetic? Yes   RO/Hemodialysis Architectural technologist   Is Total Chlorine less than 0.1 ppm? Yes   Orignial Total Chlorine Testing Time 1400   RO/Hemodialysis Facilities manager Number [id number]   RO # [id number]   Water Hardness 0   pH 7   Pressure Test Verified Yes   Alarms Verified Passed   Machine Temperature 97.7 F (36.5 C)   Manual Machine Temperature 97.7 F (36.5 C)   Alarms Verified Yes   Na+ mEq (Machine) 138 mEq   Bicarb mEq (Machine) 35 mEq   Hemodialysis Conductivity (Machine) 13.6   Hemodialysis Conductivity (Meter) 13.6   Dialyzer Lot Number [id number]   Tubing Lot Number [id number]   RO Machine Log Completed Yes   Hepatitis Status   HBsAg (Antigen) Result Negative   HBsAg Date Drawn 06/12/19   HBsAg Repeat Draw Due Date 07/10/19   Dialysis Weight   Pre-Treatment Weight (Kg) 119   Scale Type ICU Bed Scale   Vitals   Temp 98.2 F (36.8 C)   Heart Rate 63   Resp Rate 20   BP 135/61   SpO2 100 %   O2 Device None (Room air)   Assessment   Mental Status Alert;Oriented;Cooperative   Cardiac (WDL) WDL   Cardiac Regularity Regular   Cardiac Symptoms None   Cardiac Rhythm Normal Sinus Rhythm   Respiratory  WDL   Respiratory Pattern Regular   Bilateral Breath Sounds  Clear;Diminished   Edema  WDL   Generalized Edema None   Facial None   Sacral UTA   RLE Edema Non Pitting Edema   LLE Edema Non Pitting Edema   General Skin Color Appropriate for ethnicity   Skin Condition/Temp Warm;Dry   Gastrointestinal (WDL) WDL   Abdomen Inspection Soft;Nondistended   GI Symptoms None   Mobility Ambulatory with Assistance   Permacath/Temporary Catheter 05/23/19 Permacath Right   Placement Date/Time: 05/23/19 1342   Inserted by: Papadouris  Access Type: Permacath  Orientation: Right  Access Location: Tunneled, Chest  Catheter Tip Location: svc  Central Line Infection Prevention Education provided?: Yes;No  Hand Hygiene: [place]lorh...   Dressing Status and Intervention Dressing Intact   Pain Assessment   Charting Type Assessment   Pain Scale Used Numeric Scale (0-10)   Numeric Pain Scale   Pain Score 0   POSS Score 1   Hemodialysis Comments   Pre-Hemodialysis Comments time out done   Hemodialysis History Information   Outpatient Dialysis Unit [facility]   Outpatient Dialysis Schedule MWF   Outpatient Nephrologist Dr Conley Canal

## 2019-06-17 ENCOUNTER — Ambulatory Visit (INDEPENDENT_AMBULATORY_CARE_PROVIDER_SITE_OTHER): Payer: 59 | Admitting: Family

## 2019-06-23 ENCOUNTER — Emergency Department: Payer: 59

## 2019-06-23 ENCOUNTER — Observation Stay
Admission: EM | Admit: 2019-06-23 | Discharge: 2019-06-29 | Disposition: A | Discharge status: Home / Self Care | Payer: 59 | Attending: Internal Medicine | Admitting: Internal Medicine

## 2019-06-23 ENCOUNTER — Observation Stay: Payer: 59

## 2019-06-23 DIAGNOSIS — M79601 Pain in right arm: Secondary | ICD-10-CM | POA: Insufficient documentation

## 2019-06-23 DIAGNOSIS — Z6841 Body Mass Index (BMI) 40.0 and over, adult: Secondary | ICD-10-CM | POA: Insufficient documentation

## 2019-06-23 DIAGNOSIS — N186 End stage renal disease: Secondary | ICD-10-CM | POA: Insufficient documentation

## 2019-06-23 DIAGNOSIS — Z20822 Contact with and (suspected) exposure to covid-19: Secondary | ICD-10-CM | POA: Insufficient documentation

## 2019-06-23 DIAGNOSIS — Z8673 Personal history of transient ischemic attack (TIA), and cerebral infarction without residual deficits: Secondary | ICD-10-CM

## 2019-06-23 DIAGNOSIS — J449 Chronic obstructive pulmonary disease, unspecified: Secondary | ICD-10-CM | POA: Insufficient documentation

## 2019-06-23 DIAGNOSIS — Z8709 Personal history of other diseases of the respiratory system: Secondary | ICD-10-CM

## 2019-06-23 DIAGNOSIS — E785 Hyperlipidemia, unspecified: Secondary | ICD-10-CM | POA: Insufficient documentation

## 2019-06-23 DIAGNOSIS — E669 Obesity, unspecified: Secondary | ICD-10-CM | POA: Insufficient documentation

## 2019-06-23 DIAGNOSIS — N189 Chronic kidney disease, unspecified: Secondary | ICD-10-CM

## 2019-06-23 DIAGNOSIS — E1122 Type 2 diabetes mellitus with diabetic chronic kidney disease: Secondary | ICD-10-CM | POA: Insufficient documentation

## 2019-06-23 DIAGNOSIS — I82611 Acute embolism and thrombosis of superficial veins of right upper extremity: Secondary | ICD-10-CM | POA: Insufficient documentation

## 2019-06-23 DIAGNOSIS — T8189XA Other complications of procedures, not elsewhere classified, initial encounter: Secondary | ICD-10-CM | POA: Insufficient documentation

## 2019-06-23 DIAGNOSIS — M79641 Pain in right hand: Secondary | ICD-10-CM | POA: Insufficient documentation

## 2019-06-23 DIAGNOSIS — N2581 Secondary hyperparathyroidism of renal origin: Secondary | ICD-10-CM | POA: Insufficient documentation

## 2019-06-23 DIAGNOSIS — Z794 Long term (current) use of insulin: Secondary | ICD-10-CM | POA: Insufficient documentation

## 2019-06-23 DIAGNOSIS — R200 Anesthesia of skin: Secondary | ICD-10-CM | POA: Insufficient documentation

## 2019-06-23 DIAGNOSIS — G4730 Sleep apnea, unspecified: Secondary | ICD-10-CM | POA: Insufficient documentation

## 2019-06-23 DIAGNOSIS — K2971 Gastritis, unspecified, with bleeding: Secondary | ICD-10-CM | POA: Diagnosis present

## 2019-06-23 DIAGNOSIS — M109 Gout, unspecified: Principal | ICD-10-CM | POA: Insufficient documentation

## 2019-06-23 DIAGNOSIS — R531 Weakness: Secondary | ICD-10-CM | POA: Insufficient documentation

## 2019-06-23 DIAGNOSIS — I509 Heart failure, unspecified: Secondary | ICD-10-CM | POA: Insufficient documentation

## 2019-06-23 DIAGNOSIS — I10 Essential (primary) hypertension: Secondary | ICD-10-CM | POA: Diagnosis present

## 2019-06-23 DIAGNOSIS — I639 Cerebral infarction, unspecified: Secondary | ICD-10-CM

## 2019-06-23 DIAGNOSIS — S31109D Unspecified open wound of abdominal wall, unspecified quadrant without penetration into peritoneal cavity, subsequent encounter: Secondary | ICD-10-CM

## 2019-06-23 DIAGNOSIS — I132 Hypertensive heart and chronic kidney disease with heart failure and with stage 5 chronic kidney disease, or end stage renal disease: Secondary | ICD-10-CM | POA: Insufficient documentation

## 2019-06-23 DIAGNOSIS — R600 Localized edema: Secondary | ICD-10-CM | POA: Insufficient documentation

## 2019-06-23 DIAGNOSIS — E1165 Type 2 diabetes mellitus with hyperglycemia: Secondary | ICD-10-CM | POA: Diagnosis present

## 2019-06-23 DIAGNOSIS — Z992 Dependence on renal dialysis: Secondary | ICD-10-CM | POA: Insufficient documentation

## 2019-06-23 DIAGNOSIS — D631 Anemia in chronic kidney disease: Secondary | ICD-10-CM | POA: Insufficient documentation

## 2019-06-23 DIAGNOSIS — Z79899 Other long term (current) drug therapy: Secondary | ICD-10-CM | POA: Insufficient documentation

## 2019-06-23 LAB — CBC AND DIFFERENTIAL
Absolute NRBC: 0 10*3/uL (ref 0.00–0.00)
Basophils Absolute Automated: 0.03 10*3/uL (ref 0.00–0.08)
Basophils Automated: 0.4 %
Eosinophils Absolute Automated: 0.23 10*3/uL (ref 0.00–0.44)
Eosinophils Automated: 3.2 %
Hematocrit: 27.7 % — ABNORMAL LOW (ref 34.7–43.7)
Hgb: 8.1 g/dL — ABNORMAL LOW (ref 11.4–14.8)
Immature Granulocytes Absolute: 0.04 10*3/uL (ref 0.00–0.07)
Immature Granulocytes: 0.6 %
Lymphocytes Absolute Automated: 1.07 10*3/uL (ref 0.42–3.22)
Lymphocytes Automated: 14.7 %
MCH: 28.1 pg (ref 25.1–33.5)
MCHC: 29.2 g/dL — ABNORMAL LOW (ref 31.5–35.8)
MCV: 96.2 fL — ABNORMAL HIGH (ref 78.0–96.0)
MPV: 11.6 fL (ref 8.9–12.5)
Monocytes Absolute Automated: 1.05 10*3/uL — ABNORMAL HIGH (ref 0.21–0.85)
Monocytes: 14.5 %
Neutrophils Absolute: 4.84 10*3/uL (ref 1.10–6.33)
Neutrophils: 66.6 %
Nucleated RBC: 0 /100 WBC (ref 0.0–0.0)
Platelets: 127 10*3/uL — ABNORMAL LOW (ref 142–346)
RBC: 2.88 10*6/uL — ABNORMAL LOW (ref 3.90–5.10)
RDW: 14 % (ref 11–15)
WBC: 7.26 10*3/uL (ref 3.10–9.50)

## 2019-06-23 LAB — COMPREHENSIVE METABOLIC PANEL
ALT: 8 U/L (ref 0–55)
AST (SGOT): 7 U/L (ref 5–34)
Albumin/Globulin Ratio: 0.7 — ABNORMAL LOW (ref 0.9–2.2)
Albumin: 2.6 g/dL — ABNORMAL LOW (ref 3.5–5.0)
Alkaline Phosphatase: 132 U/L — ABNORMAL HIGH (ref 37–106)
Anion Gap: 12 (ref 5.0–15.0)
BUN: 28 mg/dL — ABNORMAL HIGH (ref 7.0–19.0)
Bilirubin, Total: 0.2 mg/dL (ref 0.2–1.2)
CO2: 25 mEq/L (ref 22–29)
Calcium: 7.8 mg/dL — ABNORMAL LOW (ref 8.5–10.5)
Chloride: 101 mEq/L (ref 100–111)
Creatinine: 5.6 mg/dL — ABNORMAL HIGH (ref 0.6–1.0)
Globulin: 4 g/dL — ABNORMAL HIGH (ref 2.0–3.6)
Glucose: 374 mg/dL — ABNORMAL HIGH (ref 70–100)
Potassium: 4.2 mEq/L (ref 3.5–5.1)
Protein, Total: 6.6 g/dL (ref 6.0–8.3)
Sodium: 138 mEq/L (ref 136–145)

## 2019-06-23 LAB — GLUCOSE WHOLE BLOOD - POCT
Whole Blood Glucose POCT: 327 mg/dL — ABNORMAL HIGH (ref 70–100)
Whole Blood Glucose POCT: 250 mg/dL — ABNORMAL HIGH (ref 70–100)
Whole Blood Glucose POCT: 283 mg/dL — ABNORMAL HIGH (ref 70–100)

## 2019-06-23 LAB — PT/INR
PT INR: 1 (ref 0.9–1.1)
PT: 11.9 s (ref 10.1–12.9)

## 2019-06-23 LAB — TROPONIN I: Troponin I: 0.02 ng/mL (ref 0.00–0.05)

## 2019-06-23 LAB — COVID-19 (SARS-COV-2): SARS CoV 2 Overall Result: NEGATIVE

## 2019-06-23 LAB — APTT: PTT: 30 s (ref 27–39)

## 2019-06-23 LAB — GFR: EGFR: 9.2

## 2019-06-23 LAB — HEMOLYSIS INDEX: Hemolysis Index: 9 (ref 0–18)

## 2019-06-23 MED ORDER — GLUCOSE 40 % PO GEL
15.00 g | ORAL | Status: DC | PRN
Start: 2019-06-23 — End: 2019-06-29

## 2019-06-23 MED ORDER — INSULIN LISPRO 100 UNIT/ML SC SOLN
1.00 [IU] | Freq: Three times a day (TID) | SUBCUTANEOUS | Status: DC
Start: 2019-06-23 — End: 2019-06-29
  Administered 2019-06-23: 17:00:00 3 [IU] via SUBCUTANEOUS
  Administered 2019-06-24 – 2019-06-27 (×6): 5 [IU] via SUBCUTANEOUS
  Administered 2019-06-27: 08:00:00 8 [IU] via SUBCUTANEOUS
  Administered 2019-06-27: 13:00:00 7 [IU] via SUBCUTANEOUS
  Administered 2019-06-28: 18:00:00 5 [IU] via SUBCUTANEOUS
  Administered 2019-06-28: 14:00:00 3 [IU] via SUBCUTANEOUS
  Administered 2019-06-28: 09:00:00 8 [IU] via SUBCUTANEOUS
  Filled 2019-06-23: qty 15
  Filled 2019-06-23: qty 9
  Filled 2019-06-23: qty 24
  Filled 2019-06-23: qty 9
  Filled 2019-06-23 (×2): qty 15
  Filled 2019-06-23: qty 9
  Filled 2019-06-23 (×3): qty 15
  Filled 2019-06-23: qty 9
  Filled 2019-06-23: qty 24
  Filled 2019-06-23: qty 21

## 2019-06-23 MED ORDER — RISAQUAD PO CAPS
1.00 | ORAL_CAPSULE | Freq: Every day | ORAL | Status: DC
Start: 2019-06-23 — End: 2019-06-29
  Administered 2019-06-23 – 2019-06-29 (×7): 1 via ORAL
  Filled 2019-06-23 (×7): qty 1

## 2019-06-23 MED ORDER — INSULIN LISPRO 100 UNIT/ML SC SOLN
1.00 [IU] | Freq: Every evening | SUBCUTANEOUS | Status: DC
Start: 2019-06-23 — End: 2019-06-29
  Administered 2019-06-23 – 2019-06-24 (×2): 3 [IU] via SUBCUTANEOUS
  Administered 2019-06-25: 21:00:00 2 [IU] via SUBCUTANEOUS
  Administered 2019-06-26 – 2019-06-28 (×3): 4 [IU] via SUBCUTANEOUS
  Filled 2019-06-23: qty 9
  Filled 2019-06-23 (×2): qty 12
  Filled 2019-06-23: qty 9
  Filled 2019-06-23: qty 12
  Filled 2019-06-23: qty 9
  Filled 2019-06-23 (×2): qty 6

## 2019-06-23 MED ORDER — ACETAMINOPHEN 325 MG PO TABS
650.0000 mg | ORAL_TABLET | Freq: Once | ORAL | Status: DC | PRN
Start: 2019-06-23 — End: 2019-06-23

## 2019-06-23 MED ORDER — ASPIRIN 325 MG PO TABS
325.00 mg | ORAL_TABLET | Freq: Once | ORAL | Status: AC
Start: 2019-06-23 — End: 2019-06-23
  Administered 2019-06-23: 14:00:00 325 mg via ORAL
  Filled 2019-06-23: qty 1

## 2019-06-23 MED ORDER — NALOXONE HCL 0.4 MG/ML IJ SOLN (WRAP)
0.20 mg | INTRAMUSCULAR | Status: DC | PRN
Start: 2019-06-23 — End: 2019-06-29

## 2019-06-23 MED ORDER — ALBUTEROL-IPRATROPIUM 2.5-0.5 (3) MG/3ML IN SOLN
3.00 mL | Freq: Four times a day (QID) | RESPIRATORY_TRACT | Status: DC | PRN
Start: 2019-06-23 — End: 2019-06-29

## 2019-06-23 MED ORDER — FAMOTIDINE 20 MG PO TABS
10.0000 mg | ORAL_TABLET | Freq: Every day | ORAL | Status: DC
Start: 2019-06-23 — End: 2019-06-23
  Administered 2019-06-23: 17:00:00 10 mg via ORAL
  Filled 2019-06-23: qty 1

## 2019-06-23 MED ORDER — CARVEDILOL 25 MG PO TABS
25.0000 mg | ORAL_TABLET | Freq: Two times a day (BID) | ORAL | Status: DC
Start: 2019-06-23 — End: 2019-06-29
  Administered 2019-06-23 – 2019-06-29 (×11): 25 mg via ORAL
  Filled 2019-06-23 (×11): qty 1

## 2019-06-23 MED ORDER — ONDANSETRON HCL 4 MG/2ML IJ SOLN
4.00 mg | Freq: Four times a day (QID) | INTRAMUSCULAR | Status: DC | PRN
Start: 2019-06-23 — End: 2019-06-29

## 2019-06-23 MED ORDER — ACETAMINOPHEN 650 MG RE SUPP
650.00 mg | Freq: Four times a day (QID) | RECTAL | Status: DC | PRN
Start: 2019-06-23 — End: 2019-06-29

## 2019-06-23 MED ORDER — GLUCAGON 1 MG IJ SOLR (WRAP)
1.00 mg | INTRAMUSCULAR | Status: DC | PRN
Start: 2019-06-23 — End: 2019-06-29

## 2019-06-23 MED ORDER — ONDANSETRON HCL 4 MG/2ML IJ SOLN
4.00 mg | Freq: Once | INTRAMUSCULAR | Status: DC | PRN
Start: 2019-06-23 — End: 2019-06-23

## 2019-06-23 MED ORDER — DEXTROSE 50 % IV SOLN
12.50 g | INTRAVENOUS | Status: DC | PRN
Start: 2019-06-23 — End: 2019-06-29

## 2019-06-23 MED ORDER — MELATONIN 3 MG PO TABS
3.0000 mg | ORAL_TABLET | Freq: Every evening | ORAL | Status: DC | PRN
Start: 2019-06-23 — End: 2019-06-29

## 2019-06-23 MED ORDER — GABAPENTIN 300 MG PO CAPS
600.00 mg | ORAL_CAPSULE | Freq: Three times a day (TID) | ORAL | Status: DC
Start: 2019-06-23 — End: 2019-06-29
  Administered 2019-06-23 – 2019-06-29 (×19): 600 mg via ORAL
  Filled 2019-06-23 (×19): qty 2

## 2019-06-23 MED ORDER — OXYCODONE HCL 5 MG PO TABS
5.0000 mg | ORAL_TABLET | Freq: Four times a day (QID) | ORAL | Status: DC | PRN
Start: 2019-06-23 — End: 2019-06-26
  Administered 2019-06-23 – 2019-06-26 (×9): 5 mg via ORAL
  Filled 2019-06-23 (×9): qty 1

## 2019-06-23 MED ORDER — FAMOTIDINE 20 MG PO TABS
40.0000 mg | ORAL_TABLET | Freq: Every day | ORAL | Status: DC
Start: 2019-06-23 — End: 2019-06-23

## 2019-06-23 MED ORDER — HYDRALAZINE HCL 25 MG PO TABS
25.0000 mg | ORAL_TABLET | Freq: Four times a day (QID) | ORAL | Status: DC | PRN
Start: 2019-06-23 — End: 2019-06-29

## 2019-06-23 MED ORDER — ACETAMINOPHEN 325 MG PO TABS
650.0000 mg | ORAL_TABLET | Freq: Four times a day (QID) | ORAL | Status: DC | PRN
Start: 2019-06-23 — End: 2019-06-29

## 2019-06-23 MED ORDER — ENOXAPARIN SODIUM 30 MG/0.3ML SC SOLN
30.00 mg | Freq: Every day | SUBCUTANEOUS | Status: DC
Start: 2019-06-23 — End: 2019-06-23

## 2019-06-23 MED ORDER — PANTOPRAZOLE SODIUM 40 MG PO TBEC
40.00 mg | DELAYED_RELEASE_TABLET | Freq: Two times a day (BID) | ORAL | Status: DC
Start: 2019-06-23 — End: 2019-06-29
  Administered 2019-06-23 – 2019-06-29 (×12): 40 mg via ORAL
  Filled 2019-06-23 (×12): qty 1

## 2019-06-23 MED ORDER — INSULIN GLARGINE 100 UNIT/ML SC SOLN
10.00 [IU] | Freq: Every morning | SUBCUTANEOUS | Status: DC
Start: 2019-06-23 — End: 2019-06-25
  Administered 2019-06-23 – 2019-06-25 (×3): 10 [IU] via SUBCUTANEOUS
  Filled 2019-06-23 (×3): qty 10

## 2019-06-23 MED ORDER — ONDANSETRON 4 MG PO TBDP
4.00 mg | ORAL_TABLET | Freq: Four times a day (QID) | ORAL | Status: DC | PRN
Start: 2019-06-23 — End: 2019-06-29

## 2019-06-23 MED ORDER — ENOXAPARIN SODIUM 40 MG/0.4ML SC SOLN
40.00 mg | Freq: Every day | SUBCUTANEOUS | Status: DC
Start: 2019-06-23 — End: 2019-06-24
  Administered 2019-06-23 – 2019-06-24 (×2): 40 mg via SUBCUTANEOUS
  Filled 2019-06-23 (×2): qty 0.4

## 2019-06-23 MED ORDER — AMLODIPINE BESYLATE 5 MG PO TABS
5.0000 mg | ORAL_TABLET | Freq: Two times a day (BID) | ORAL | Status: DC
Start: 2019-06-23 — End: 2019-06-29
  Administered 2019-06-23 – 2019-06-29 (×11): 5 mg via ORAL
  Filled 2019-06-23 (×11): qty 1

## 2019-06-23 MED ORDER — GLUCAGON 1 MG IJ SOLR (WRAP)
1.00 mg | INTRAMUSCULAR | Status: DC | PRN
Start: 2019-06-23 — End: 2019-06-23

## 2019-06-23 MED ORDER — ATORVASTATIN CALCIUM 40 MG PO TABS
40.0000 mg | ORAL_TABLET | Freq: Every day | ORAL | Status: DC
Start: 2019-06-23 — End: 2019-06-29
  Administered 2019-06-23 – 2019-06-29 (×7): 40 mg via ORAL
  Filled 2019-06-23 (×7): qty 1

## 2019-06-23 MED ORDER — SEVELAMER CARBONATE 800 MG PO TABS
800.0000 mg | ORAL_TABLET | Freq: Three times a day (TID) | ORAL | Status: DC
Start: 2019-06-23 — End: 2019-06-29
  Administered 2019-06-23 – 2019-06-29 (×17): 800 mg via ORAL
  Filled 2019-06-23 (×17): qty 1

## 2019-06-23 NOTE — ACP (Advance Care Planning) (Signed)
Patient Care Conference Note    Patient Name: [name]:      Meeting Date: 06/23/19         Purpose of Meeting  Update or Clarify Diagnosis  Update Prognosis  Discuss Goals of care  Discuss Plan of care  Discuss Disposition  Discuss code status    Meeting Participants   Patient  and OtherFamily members via phone    Medical Decision Maker      Does [name] have medical decision-making capacity?  Yes   The patient did  participate in the meeting.      Summary of Medical Condition/ Treatment Options/ Prognosis  64 y.o. female with a PMHx of hypertension, hyperlipidemia, insulin-dependent diabetes mellitus, COPD, ESRD currently on hemodialysis Tuesday Thursday Saturday, peritoneal dialysis associated Pseudomonas/E. coli peritonitis, upper GI bleed secondary to gastritis on 05/13/2019 and anemia of chronic kidney disease who presented with right sided numbness and weakness      Patient's or Decision Maker's Perspective, Wishes, and Goals for Treatment  Patient's current diagnosis, prognosis, hospital course discussed in detail.  Goals of care and CODE STATUS discussed as well.  Patient reports that she wants to remain DNR with support okay.  Patient reports that she wants her daughter [name] [phone]) to be her healthcare proxy and medical power of attorney in case she cannot speak for herself.  Patient reports that she does have advanced directive form/medical power of attorney form somewhere in her house which she can bring later      Code Status and Medical Interventions Discussed   Code Status (patient has no pulse)  CPR - Comments: DNR, support okay      Advance Directive Documentation  Existing Documents that were Reviewed/Discussed with Patient/Decision Maker:  None  Documents Filled Out as Part of Discussion:                None    Medical Decisions and Plan of Care  Neurology consulted.  Patient will get MRI of the brain to rule out CVA.  We will also get venous duplex of right  upper extremity to rule out DVT      This information was shared with: Primary team    I have spent 20 minutes on face-to-face [place]. 100% of the time was spent on discussion and counseling the patient. No active management of the problems listed above was undertaken during the time period reported.       Signed by: Marya Landry

## 2019-06-23 NOTE — Plan of Care (Signed)
Discharge Recommendation: Home with supervision, Home with home health PT, Home with home health OT  DME Recommended for Discharge: Front wheel walker    Is an Occupational Therapy Evaluation Indicated at this time? Yes, an OT evaluation is indicated at this time.    Treatment/Interventions: Exercise, Gait training, Neuromuscular re-education, Functional transfer training, LE strengthening/ROM, Bed mobility, Continued evaluation  PT Frequency: 2-3x/wk(to increase potential of safe d/c home)     PMP Activity: Step 6 - Walks in Room  Distance Walked (ft) (Step 6,7): 3 Feet  (Please See Therapy Evaluation for device and assistance level needed)    Goals:   Goals  Goal Formulation: With patient  Time for Goal Acheivement: By time of discharge  Goals: Select goal  Pt Will Go Supine To Sit: independent, to maximize functional mobility and independence  Pt Will Perform Sit to Stand: with supervision, to maximize functional mobility and independence  Pt Will Transfer Bed/Chair: with rolling walker, with supervision, to maximize functional mobility and independence  Pt Will Ambulate: 31-50 feet, with rolling walker, with stand by assist, to maximize functional mobility and independence

## 2019-06-23 NOTE — ED Provider Notes (Signed)
EMERGENCY DEPARTMENT HISTORY AND PHYSICAL EXAM     Physician/Midlevel provider first contact with patient: 06/23/19 1215             Provider Note:       R/o cva, r/o ich, r/o nstemi, r/o electrolyte abn, will admit for cva workup    Personal Protective Equipment (PPE)    Face Shield, Gloves, Goggles, N95, Geologist, engineering and Surgical / [place] Cap      HISTORY OF PRESENT ILLNESS   Historian:Patient  Translator Used: No    64 y.o. female h/o copd, dm, hld, htn, here b/c of worsening right sided numbness from face to right arm and right leg starting 3 days ago. H/o ESRD on HD via right chest HD catheter t/t/s.     1. Location of symptoms: neuro  2. Onset of symptoms: 3 days  3. What was patient doing when symptoms started (Context): nothing  4. Severity: moderate  5. Timing: constant  6. Activities that worsen symptoms: none  7. Activities that improve symptoms: none  8. Quality:   9. Radiation of symptoms:  10. Associated signs and Symptoms: see above  11. Are symptoms worsening? Yes    MEDICAL HISTORY     Past Medical History:  Past Medical History:   Diagnosis Date    Chronic obstructive pulmonary disease     Diabetes mellitus     Hyperlipidemia     Hypertension     Sleep apnea 2018       Past Surgical History:  Past Surgical History:   Procedure Laterality Date    EGD, BIOPSY N/A 05/14/2019    Procedure: EGD, BIOPSY;  Surgeon: Ronie Spies, MD;  Location: [facility];  Service: Gastroenterology;  Laterality: N/A;    EXPLORATORY LAPAROTOMY N/A 05/27/2019    Procedure: EXPLORATORY LAPAROTOMY;  Surgeon: Herbert Moors., MD;  Location: [facility];  Service: General;  Laterality: N/A;    JOINT REPLACEMENT      shoulder    JOINT REPLACEMENT      knee    OVARY SURGERY Left     Removal    REMOVAL, FOREIGN BODY N/A 05/27/2019    Procedure: REMOVAL, PERITONEAL DIALYSIS CATHETER;  Surgeon: Herbert Moors., MD;  Location: [facility];  Service: General;  Laterality: N/A;    TUNNELED CATH  PLACEMENT ([place]) Bilateral 05/23/2019    Procedure: TUNNELED CATH PLACEMENT;  Surgeon: Jacques Earthly, MD;  Location: [facility];  Service: Interventional Radiology;  Laterality: Bilateral;       Social History:  Social History     Socioeconomic History    Marital status: Legally Separated     Spouse name: Not on file    Number of children: Not on file    Years of education: Not on file    Highest education level: Not on file   Occupational History    Not on file   Tobacco Use    Smoking status: Never Smoker    Smokeless tobacco: Never Used   Substance and Sexual Activity    Alcohol use: Never    Drug use: Never    Sexual activity: Not Currently   Other Topics Concern    Not on file   Social History Narrative    Not on file     Social Determinants of Health     Financial Resource Strain:     Difficulty of Paying Living Expenses:    Food Insecurity:     Worried  About Running Out of Food in the Last Year:     Ran Out of Food in the Last Year:    Transportation Needs:     Film/video editor (Medical):     Lack of Transportation (Non-Medical):    Physical Activity:     Days of Exercise per Week:     Minutes of Exercise per Session:    Stress:     Feeling of Stress :    Social Connections:     Frequency of Communication with Friends and Family:     Frequency of Social Gatherings with Friends and Family:     Attends Religious Services:     Active Member of Clubs or Organizations:     Attends Music therapist:     Marital Status:    Intimate Partner Violence:     Fear of Current or Ex-Partner:     Emotionally Abused:     Physically Abused:     Sexually Abused:        Family History:  Family History   Problem Relation Age of Onset    Diabetes Mother     Hypertension Mother     Diabetes Father     Hypertension Father     Diabetes Sister     Hypertension Sister     Diabetes Brother     Hypertension Brother     Diabetes Paternal Aunt     Diabetes Paternal Uncle         Allergies:  Allergies   Allergen Reactions    Lisinopril Hives    Metformin Hives       Outpatient Medication:  Previous Medications    AMLODIPINE (NORVASC) 5 MG TABLET    Take 1 tablet (5 mg total) by mouth every 12 (twelve) hours    ATORVASTATIN (LIPITOR) 40 MG TABLET    Take 40 mg by mouth daily    CARVEDILOL (COREG) 25 MG TABLET    Take 1 tablet (25 mg total) by mouth every 12 (twelve) hours    DULAGLUTIDE (TRULICITY) 5.37 HK/3.2XM SOLUTION PEN-INJECTOR    Inject 0.75 mg into the skin once a week    FAMOTIDINE (PEPCID) 40 MG TABLET    Take 40 mg by mouth daily    GABAPENTIN (NEURONTIN) 600 MG TABLET    Take 600 mg by mouth 3 (three) times daily    INSULIN ASPART (NOVOLOG) 100 UNIT/ML INJECTION    Inject into the skin 3 (three) times daily before meals    INSULIN GLARGINE (LANTUS) 100 UNIT/ML INJECTION    Inject 10 Units into the skin every morning    LACTOBACILLUS/STREPTOCOCCUS (RISAQUAD) CAP    Take 1 capsule by mouth daily    LIDOCAINE (LIDODERM) 5 %    Place 1 patch onto the skin every 24 hours Remove & Discard patch within 12 hours or as directed by MD    OXYCODONE (ROXICODONE) 5 MG IMMEDIATE RELEASE TABLET    Take 1 tablet (5 mg total) by mouth every 6 (six) hours as needed for Pain    PANTOPRAZOLE (PROTONIX) 40 MG TABLET    Take 1 tablet (40 mg total) by mouth 2 (two) times daily    SEVELAMER (RENVELA) 800 MG TABLET    Take 800 mg by mouth 3 (three) times daily with meals    ZINC OXIDE (DESITIN) 40 % PASTE PASTE    Apply topically every 6 (six) hours         REVIEW OF SYSTEMS  ADD ROS  Review of Systems   Neurological: Positive for sensory change.   All other systems reviewed and are negative.    PHYSICAL EXAM     Vitals:    06/23/19 1137   BP: 154/72   Pulse: 84   Resp: 18   Temp: 98.9 F (37.2 C)   SpO2: 97%     Nursing note and vitals reviewed.  Constitutional:  Well developed, well nourished. Awake & Oriented x3.  Head:  Atraumatic. Normocephalic.    Eyes:  PERRL. EOMI. Conjunctivae are not  pale.  ENT:  Mucous membranes are moist and intact. Patent airway.  Neck:  Supple. Full ROM.    Cardiovascular:  Regular rate. Regular rhythm. Right chest HD catheter.   Respiratory:  No evidence of respiratory distress.   GI:  Soft and non-distended. There is no tenderness. No rebound, guarding, or rigidity.  Back:  Full ROM. Nontender.  MSK:  No edema. No cyanosis. No clubbing. Full range of motion in all extremities.  Skin:  Skin is warm and dry.  No diaphoresis. No rash.   Neurological:  Alert, awake, and appropriate. Normal speech. Motor normal. Right sided numbness.   Psychiatric:  Good eye contact. Normal interaction, affect, and behavior.      MEDICAL DECISION MAKING     Vital Signs: Reviewed the patients vital signs.   Nursing Notes: Reviewed and utilized available nursing notes.  Medical Records Reviewed: Reviewed available past medical records.      PROCEDURES        CARDIAC STUDIES        Monitor Strip  Interpreted by ED Physician  Rate: 80  Rhythm: SR   ST Changes: none     EKG Interpretation:    12:19 NSR at 78 bpm, NAD, no LVH, qrs 76, qtc 465 (-) St-T changes similar to ekg on 05/27/2019 as read by me.     IMAGING STUDIES      CT Head WO Contrast   Final Result    No intra-axial mass lesion or hemorrhage is demonstrated.      Steward Drone, MD    06/23/2019 1:26 PM             PULSE OXIMETRY    Oxygen Saturation by Pulse Oximetry: 99%  Interventions: none  Interpretation: normal     EMERGENCY DEPT. MEDICATIONS      ED Medication Orders (From admission, onward)    Start Ordered     Status Ordering Provider    06/23/19 1334 06/23/19 1333  aspirin tablet 325 mg  Once     Route: Oral  Ordered Dose: 325 mg     Last MAR action: Given Larrisa Cravey SHU          LABORATORY RESULTS    Ordered and independently interpreted AVAILABLE laboratory tests. Please see results section in chart for full details.  Results for orders placed or performed during the hospital encounter of 06/23/19   CBC and differential   Result  Value Ref Range    WBC 7.26 3.10 - 9.50 x10 3/uL    Hgb 8.1 (L) 11.4 - 14.8 g/dL    Hematocrit 27.7 (L) 34.7 - 43.7 %    Platelets 127 (L) 142 - 346 x10 3/uL    RBC 2.88 (L) 3.90 - 5.10 x10 6/uL    MCV 96.2 (H) 78.0 - 96.0 fL    MCH 28.1 25.1 - 33.5 pg    MCHC 29.2 (L) 31.5 - 35.8 g/dL  RDW 14 11 - 15 %    MPV 11.6 8.9 - 12.5 fL    Neutrophils 66.6 None %    Lymphocytes Automated 14.7 None %    Monocytes 14.5 None %    Eosinophils Automated 3.2 None %    Basophils Automated 0.4 None %    Immature Granulocytes 0.6 None %    Nucleated RBC 0.0 0.0 - 0.0 /100 WBC    Neutrophils Absolute 4.84 1.10 - 6.33 x10 3/uL    Lymphocytes Absolute Automated 1.07 0.42 - 3.22 x10 3/uL    Monocytes Absolute Automated 1.05 (H) 0.21 - 0.85 x10 3/uL    Eosinophils Absolute Automated 0.23 0.00 - 0.44 x10 3/uL    Basophils Absolute Automated 0.03 0.00 - 0.08 x10 3/uL    Immature Granulocytes Absolute 0.04 0.00 - 0.07 x10 3/uL    Absolute NRBC 0.00 0.00 - 0.00 x10 3/uL   Comprehensive metabolic panel   Result Value Ref Range    Glucose 374 (H) 70 - 100 mg/dL    BUN 28.0 (H) 7.0 - 19.0 mg/dL    Creatinine 5.6 (H) 0.6 - 1.0 mg/dL    Sodium 138 136 - 145 mEq/L    Potassium 4.2 3.5 - 5.1 mEq/L    Chloride 101 100 - 111 mEq/L    CO2 25 22 - 29 mEq/L    Calcium 7.8 (L) 8.5 - 10.5 mg/dL    Protein, Total 6.6 6.0 - 8.3 g/dL    Albumin 2.6 (L) 3.5 - 5.0 g/dL    AST (SGOT) 7 5 - 34 U/L    ALT 8 0 - 55 U/L    Alkaline Phosphatase 132 (H) 37 - 106 U/L    Bilirubin, Total 0.2 0.2 - 1.2 mg/dL    Globulin 4.0 (H) 2.0 - 3.6 g/dL    Albumin/Globulin Ratio 0.7 (L) 0.9 - 2.2    Anion Gap 12.0 5.0 - 15.0   Prothrombin time/INR   Result Value Ref Range    PT 11.9 10.1 - 12.9 sec    PT INR 1.0 0.9 - 1.1   APTT   Result Value Ref Range    PTT 30 27 - 39 sec   Troponin I   Result Value Ref Range    Troponin I 0.02 0.00 - 0.05 ng/mL   Hemolysis index   Result Value Ref Range    Hemolysis Index 9 0 - 18   GFR   Result Value Ref Range    EGFR 9.2    Glucose Whole  Blood - POCT   Result Value Ref Range    Whole Blood Glucose POCT 327 (H) 70 - 100 mg/dL       CLINICAL DECISION SUPPORT   NIH Stroke Score      Most Recent Value   Patient's calculated Stroke Score:  1 filed at 06/23/2019 1150            ED Course      1:35 pm d/w Dr. Janann Colonel, neuro, will consult.     Paged sound    Pt agree w/ admission.     1:55 pm d/w Dr. Mallie Darting, will admit.     2. Aspirin: Aspirin was given    3. Core Measures:   Not tpa b/c outside of time window        ATTESTATIONS      Physician Attestation: Di Kindle Erdine Hulen , have been the primary provider for [name] during this Emergency Dept visit  and have reviewed the chart documented for accuracy and agree with its content.       DIAGNOSIS      Diagnosis:  Final diagnoses:   Cerebrovascular accident (CVA), unspecified mechanism   Right sided numbness   ESRD (end stage renal disease) on dialysis       Disposition:  ED Disposition     ED Disposition Condition Date/Time Comment    Observation  Mon Jun 23, 2019  1:55 PM Admitting Physician: Marya Landry [[id number]]   Service:: Medicine [[id number]]   Estimated Length of Stay: < 2 midnights   Tentative Discharge Plan?: Home or Self Care [1]   Does patient need telemetry?: Yes   Telemetry type (separate Telemetry order is also required):: Cardiac telemetry            Prescriptions:  Patient's Medications   New Prescriptions    No medications on file   Previous Medications    AMLODIPINE (NORVASC) 5 MG TABLET    Take 1 tablet (5 mg total) by mouth every 12 (twelve) hours    ATORVASTATIN (LIPITOR) 40 MG TABLET    Take 40 mg by mouth daily    CARVEDILOL (COREG) 25 MG TABLET    Take 1 tablet (25 mg total) by mouth every 12 (twelve) hours    DULAGLUTIDE (TRULICITY) 4.81 EH/6.3JS SOLUTION PEN-INJECTOR    Inject 0.75 mg into the skin once a week    FAMOTIDINE (PEPCID) 40 MG TABLET    Take 40 mg by mouth daily    GABAPENTIN (NEURONTIN) 600 MG TABLET    Take 600 mg by mouth 3 (three) times daily    INSULIN ASPART  (NOVOLOG) 100 UNIT/ML INJECTION    Inject into the skin 3 (three) times daily before meals    INSULIN GLARGINE (LANTUS) 100 UNIT/ML INJECTION    Inject 10 Units into the skin every morning    LACTOBACILLUS/STREPTOCOCCUS (RISAQUAD) CAP    Take 1 capsule by mouth daily    LIDOCAINE (LIDODERM) 5 %    Place 1 patch onto the skin every 24 hours Remove & Discard patch within 12 hours or as directed by MD    OXYCODONE (ROXICODONE) 5 MG IMMEDIATE RELEASE TABLET    Take 1 tablet (5 mg total) by mouth every 6 (six) hours as needed for Pain    PANTOPRAZOLE (PROTONIX) 40 MG TABLET    Take 1 tablet (40 mg total) by mouth 2 (two) times daily    SEVELAMER (RENVELA) 800 MG TABLET    Take 800 mg by mouth 3 (three) times daily with meals    ZINC OXIDE (DESITIN) 40 % PASTE PASTE    Apply topically every 6 (six) hours   Modified Medications    No medications on file   Discontinued Medications    No medications on file              Kenishia Plack, Devin Going, MD PhD  06/23/19 1356

## 2019-06-23 NOTE — ED Notes (Signed)
Bed: GR9  Expected date: 06/23/19  Expected time: 11:17 AM  Means of arrival: [id number] - Lysbeth Galas  Comments:  [id number]

## 2019-06-23 NOTE — Consults (Signed)
[facility] Consultation Note                                       Date Time: 06/23/19 2:02 PM  Patient Name: [name]  Requesting Physician: Marya Landry, MD  Date of Admission: 06/23/2019    CC / Reason for Consultation: numbness          Assessment:     64y/o woman with hx of DM, ESRD on HD presenting with 3 day history of numbness involving her right arm and leg. Exam notable for flattening of right NLF, decreased sensation RUE/LE and weakness in RUE. CT head imaging reviewed and no acute process noted.     Cannot r/o acute CVA.       Plan:     --ASA 325mg  x 1 dose in ED  --MRI brain  --further workup and plan pending MRI results      HPI   [name] is a 64 y.o. female with hx of ESRD on HD presenting with numbness. She notes symptoms started 3 days ago and involves her right  arm and leg. She denies any face involvement. She also reports severe pain and weakness involving her distal RUE. She feels that her right hand is swollen. Also notes some cervical neck pain. No speech or visual deficits. No vertigo or gait instability. Symptoms persisted so she came to the ED. Denies any prior stroke or TIA history. No recent head or neck trauma. No headache.     Onset: 3 days ago  Timing: sudden onset, progressive  Location: right UE and LE  Quality: numbness  Severity: moderate  Exacerbating factors: none  Alleviating factors: none  Associated Symptoms: RUE weakness and pain  Pertinent Negatives: per HPI    Past Medical Hx     Past Medical History:   Diagnosis Date    Chronic obstructive pulmonary disease     Diabetes mellitus     Hyperlipidemia     Hypertension     Sleep apnea 2018          Past Surgical Hx:     Past Surgical History:   Procedure Laterality Date    EGD, BIOPSY N/A 05/14/2019    Procedure: EGD, BIOPSY;  Surgeon: Ronie Spies, MD;  Location: [facility];  Service: Gastroenterology;  Laterality: N/A;    EXPLORATORY LAPAROTOMY N/A 05/27/2019    Procedure:  EXPLORATORY LAPAROTOMY;  Surgeon: Herbert Moors., MD;  Location: [facility];  Service: General;  Laterality: N/A;    JOINT REPLACEMENT      shoulder    JOINT REPLACEMENT      knee    OVARY SURGERY Left     Removal    REMOVAL, FOREIGN BODY N/A 05/27/2019    Procedure: REMOVAL, PERITONEAL DIALYSIS CATHETER;  Surgeon: Herbert Moors., MD;  Location: [facility];  Service: General;  Laterality: N/A;    TUNNELED CATH PLACEMENT ([place]) Bilateral 05/23/2019    Procedure: TUNNELED CATH PLACEMENT;  Surgeon: Jacques Earthly, MD;  Location: [facility];  Service: Interventional Radiology;  Laterality: Bilateral;        Family Medical History:      Family History   Problem Relation Age of Onset    Diabetes Mother     Hypertension Mother     Diabetes Father     Hypertension Father  Diabetes Sister     Hypertension Sister     Diabetes Brother     Hypertension Brother     Diabetes Paternal Aunt     Diabetes Paternal Uncle        Social Hx     Social History     Socioeconomic History    Marital status: Legally Separated     Spouse name: Not on file    Number of children: Not on file    Years of education: Not on file    Highest education level: Not on file   Occupational History    Not on file   Tobacco Use    Smoking status: Never Smoker    Smokeless tobacco: Never Used   Substance and Sexual Activity    Alcohol use: Never    Drug use: Never    Sexual activity: Not Currently   Other Topics Concern    Not on file   Social History Narrative    Not on file     Social Determinants of Health     Financial Resource Strain:     Difficulty of Paying Living Expenses:    Food Insecurity:     Worried About Charity fundraiser in the Last Year:     Arboriculturist in the Last Year:    Transportation Needs:     Film/video editor (Medical):     Lack of Transportation (Non-Medical):    Physical Activity:     Days of Exercise per Week:     Minutes of Exercise per Session:     Stress:     Feeling of Stress :    Social Connections:     Frequency of Communication with Friends and Family:     Frequency of Social Gatherings with Friends and Family:     Attends Religious Services:     Active Member of Clubs or Organizations:     Attends Music therapist:     Marital Status:    Intimate Partner Violence:     Fear of Current or Ex-Partner:     Emotionally Abused:     Physically Abused:     Sexually Abused:        Meds     Home :   Prior to Admission medications    Medication Sig Start Date End Date Taking? Authorizing Provider   amLODIPine (NORVASC) 5 MG tablet Take 1 tablet (5 mg total) by mouth every 12 (twelve) hours 05/20/19   Matiana, Rollen Sox, MD   atorvastatin (LIPITOR) 40 MG tablet Take 40 mg by mouth daily    [provider]   carvedilol (COREG) 25 MG tablet Take 1 tablet (25 mg total) by mouth every 12 (twelve) hours 06/04/19   Delfina Redwood, MD   Dulaglutide (Trulicity) 7.10 GY/6.9SW Solution Pen-injector Inject 0.75 mg into the skin once a week    [provider]   famotidine (PEPCID) 40 MG tablet Take 40 mg by mouth daily    [provider]   gabapentin (NEURONTIN) 600 MG tablet Take 600 mg by mouth 3 (three) times daily    [provider]   insulin aspart (NovoLOG) 100 UNIT/ML injection Inject into the skin 3 (three) times daily before meals    [provider]   insulin glargine (LANTUS) 100 UNIT/ML injection Inject 10 Units into the skin every morning 05/27/19   Ardeth Sportsman, MD   lactobacillus/streptococcus (RISAQUAD) Cap Take 1 capsule  by mouth daily 06/04/19 07/04/19  Delfina Redwood, MD   lidocaine (LIDODERM) 5 % Place 1 patch onto the skin every 24 hours Remove & Discard patch within 12 hours or as directed by MD 06/12/19   Delfina Redwood, MD   oxyCODONE (ROXICODONE) 5 MG immediate release tablet Take 1 tablet (5 mg total) by mouth every 6 (six) hours as needed for Pain 06/04/19   Delfina Redwood, MD   pantoprazole (PROTONIX) 40 MG tablet Take 1 tablet (40 mg total) by mouth 2 (two) times daily 05/20/19   Ardeth Sportsman, MD   sevelamer (RENVELA) 800 MG tablet Take 800 mg by mouth 3 (three) times daily with meals    [provider]   zinc Oxide (DESITIN) 40 % Paste paste Apply topically every 6 (six) hours 06/04/19 07/04/19  Delfina Redwood, MD      Inpatient :       Allergies    Lisinopril and Metformin      Review of Systems     All other systems were reviewed and are negative except for that mentioned in the HPI    Physical Exam:   Temp:  [98.1 F (36.7 C)-98.9 F (37.2 C)] 98.1 F (36.7 C)  Heart Rate:  [76-84] 76  Resp Rate:  [18] 18  BP: (154-163)/(72-74) 163/74       General: in no acute distress. Cooperative with the exam  Neck: supple  CVS: warm and well perfused  Resp: no respiratory distress  Extremities: slight swelling of distal RUE    Neurological Examination:  MSE: A&Ox3, speech fluent without word-finding difficulty or paraphasic errors, simple naming, reading and repetition intact, DOW backwards intact.    CN: Pupils 4-->64mm b/l, EOMI, no nystagmus, VFFC, reports sensation in V1-V3 intact to LT/temp, flattening of right NLF but symmetric smile, tongue and palate midline.  No dysarthria.  Motor:LUE and LLE 5/5. Difficult to assess RUE due to reported pain but proximal appears 5/5, distal 4/5 (potentially pain related). Drift of RLE  Sensory: decreased LT on RUE and RLE. DSS intact  Coord: FNF, RAM without limb ataxia or dysmetria.  Gait: deferred  Reflexes: DTRs 2+ throughout, toes down        Labs:     Results     Procedure Component Value Units Date/Time    COVID-19 (SARS-COV-2) [employer] [facility]) [[id number]] Collected: 06/23/19 1347    Specimen: Nasopharyngeal Swab from Nasopharynx Updated: 06/23/19 1355     Purpose of COVID testing Screening     SARS-CoV-2 Specimen Source Nasopharyngeal    Narrative:      o Collect and clearly label specimen type:  o Upper respiratory specimen:  One Nasopharyngeal Dry Swab NO  Transport Media.  o Hand deliver to laboratory ASAP  Indication for testing->Extended care facility admission to  semi private room    Troponin I [[id number]] Collected: 06/23/19 1229    Specimen: Blood Updated: 06/23/19 1301     Troponin I 0.02 ng/mL     CBC and differential [[id number]]  (Abnormal) Collected: 06/23/19 1229    Specimen: Blood Updated: 06/23/19 1300     WBC 7.26 x10 3/uL      Hgb 8.1 g/dL      Hematocrit 27.7 %      Platelets 127 x10 3/uL      RBC 2.88 x10 6/uL      MCV 96.2 fL      MCH 28.1 pg      MCHC 29.2  g/dL      RDW 14 %      MPV 11.6 fL      Neutrophils 66.6 %      Lymphocytes Automated 14.7 %      Monocytes 14.5 %      Eosinophils Automated 3.2 %      Basophils Automated 0.4 %      Immature Granulocytes 0.6 %      Nucleated RBC 0.0 /100 WBC      Neutrophils Absolute 4.84 x10 3/uL      Lymphocytes Absolute Automated 1.07 x10 3/uL      Monocytes Absolute Automated 1.05 x10 3/uL      Eosinophils Absolute Automated 0.23 x10 3/uL      Basophils Absolute Automated 0.03 x10 3/uL      Immature Granulocytes Absolute 0.04 x10 3/uL      Absolute NRBC 0.00 x10 3/uL     Comprehensive metabolic panel [[id number]]  (Abnormal) Collected: 06/23/19 1229    Specimen: Blood Updated: 06/23/19 1255     Glucose 374 mg/dL      BUN 28.0 mg/dL      Creatinine 5.6 mg/dL      Sodium 138 mEq/L      Potassium 4.2 mEq/L      Chloride 101 mEq/L      CO2 25 mEq/L      Calcium 7.8 mg/dL      Protein, Total 6.6 g/dL      Albumin 2.6 g/dL      AST (SGOT) 7 U/L      ALT 8 U/L      Alkaline Phosphatase 132 U/L      Bilirubin, Total 0.2 mg/dL      Globulin 4.0 g/dL      Albumin/Globulin Ratio 0.7     Anion Gap 12.0    Hemolysis index [[id number]] Collected: 06/23/19 1229     Updated: 06/23/19 1255     Hemolysis Index 9    GFR [[id number]] Collected: 06/23/19 1229     Updated: 06/23/19 1255     EGFR 9.2    Prothrombin time/INR [[id number]] Collected: 06/23/19 1229    Specimen: Blood Updated: 06/23/19  1245     PT 11.9 sec      PT INR 1.0    APTT [[id number]] Collected: 06/23/19 1229     Updated: 06/23/19 1245     PTT 30 sec     Glucose Whole Blood - POCT [[id number]]  (Abnormal) Collected: 06/23/19 1148     Updated: 06/23/19 1153     Whole Blood Glucose POCT 327 mg/dL           Rads:     Results for orders placed or performed during the hospital encounter of 06/23/19   CT Head WO Contrast    Narrative    INDICATION: Cerebrovascular accident.    TECHNIQUE: Multiple transaxial images of the head were obtained. No  intravenous contrast was administered. A combination of automatic  exposure control, adjustment of the mA and or KV according to patient  size, and/or use of iterative reconstruction technique was utilized.    FINDINGS: No intra-axial mass lesion, hemorrhage, or territorial  infarction is demonstrated. Ventricular system is within normal limits.  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 No  regions of abnormal density are noted within the brain parenchyma. The  visualized portions of the sinuses and mastoid air cells are clear.      Impression  No intra-axial mass lesion or hemorrhage is demonstrated.    Steward Drone, MD   06/23/2019 1:26 PM           Jim Like  [facility]  [place] [phone]

## 2019-06-23 NOTE — ED Triage Notes (Signed)
Pt BIBA from home c/o right hand pain and numbness that began yesterday and has gotten progressively worse. Pt denies injury/trauma. Pt states she was recently released from [facility] for infection. Pt received dialysis on Saturday with no complications.

## 2019-06-23 NOTE — PT Eval Note (Signed)
[facility]  Physical Therapy Evaluation and Treatment    Patient: [name]  MRN#: [id number]  Unit: [facility] [facility]  Bed: A2528/A2528-01    Time of Evaluation and Treatment:  Time Calculation  PT Received On: 06/23/19  Start Time: 1620  Stop Time: 1645  Time Calculation (min): 25 min    Evaluation Time: 15 minutes  Treatment Time: 10 minutes    Chart Review and Collaboration with Care Team: 5 minutes, not included in above time    PT Visit Number: 1    Consult received for [name] for PT Evaluation and Treatment.  Patients medical condition is appropriate for Physical therapy intervention at this time.    Activity Orders:  PT eval and treat    Precautions and Contraindications:  Precautions  Weight Bearing Status: no restrictions  Other Precautions: falls, contact    Personal Protective Equipment (PPE)  gloves, procedure gown, procedure mask and eye shield/covering    Medical Diagnosis:  ESRD (end stage renal disease) on dialysis [N18.6, Z99.2]  Cerebrovascular accident (CVA), unspecified mechanism [I63.9]  Right sided numbness [R20.0]    History of Present Illness:  [name] is a 64 y.o. female admitted on 06/23/2019 with 3 day history of numbness involving her right arm and leg.    Patient Active Problem List   Diagnosis    Iron deficiency anemia secondary to inadequate dietary iron intake    Sideropenic dysphagia    Peritoneal dialysis catheter dysfunction, initial encounter    Peritonitis    Upper GI bleed    ESRD (end stage renal disease) on dialysis    Generalized abdominal pain    Thrombocytopenia    Hypotension    Type 2 diabetes mellitus, with long-term current use of insulin    GERD (gastroesophageal reflux disease)    Bowel incontinence    Right sided numbness    Uncontrolled type 2 diabetes mellitus with hyperglycemia    Hypertension    Hyperlipidemia    History of COPD    Gastritis with hemorrhage    History of anemia due to chronic kidney  disease       Past Medical/Surgical History:  Past Medical History:   Diagnosis Date    Chronic obstructive pulmonary disease     Diabetes mellitus     Hyperlipidemia     Hypertension     Sleep apnea 2018     Past Surgical History:   Procedure Laterality Date    EGD, BIOPSY N/A 05/14/2019    Procedure: EGD, BIOPSY;  Surgeon: Ronie Spies, MD;  Location: [facility];  Service: Gastroenterology;  Laterality: N/A;    EXPLORATORY LAPAROTOMY N/A 05/27/2019    Procedure: EXPLORATORY LAPAROTOMY;  Surgeon: Herbert Moors., MD;  Location: [facility];  Service: General;  Laterality: N/A;    JOINT REPLACEMENT      shoulder    JOINT REPLACEMENT      knee    OVARY SURGERY Left     Removal    REMOVAL, FOREIGN BODY N/A 05/27/2019    Procedure: REMOVAL, PERITONEAL DIALYSIS CATHETER;  Surgeon: Herbert Moors., MD;  Location: [facility];  Service: General;  Laterality: N/A;    TUNNELED CATH PLACEMENT ([place]) Bilateral 05/23/2019    Procedure: TUNNELED CATH PLACEMENT;  Surgeon: Jacques Earthly, MD;  Location: [facility];  Service: Interventional Radiology;  Laterality: Bilateral;       X-Rays/Tests/Labs:  Lab Results   Component Value Date/Time  HGB 8.1 (L) 06/23/2019 12:29 PM    HCT 27.7 (L) 06/23/2019 12:29 PM    K 4.2 06/23/2019 12:29 PM    NA 138 06/23/2019 12:29 PM    INR 1.0 06/23/2019 12:29 PM    TROPI 0.02 06/23/2019 12:29 PM    TROPI 0.04 05/17/2019 06:20 PM    TROPI 0.05 05/17/2019 01:37 PM    TROPI 0.01 03/18/2006 06:10 PM       All imaging reviewed, please see chart for details.    Social History:  Prior Level of Function  Prior level of function: Ambulates with assistive device, Independent with ADLs("i do the best i can")  Assistive Device: Four wheel walker  Baseline Activity Level: Household ambulation  Driving: does not drive  Cooking: Yes  DME Currently at Home: (none)    Home Living Arrangements  Living Arrangements: Alone  Type of Home: [place]: One level,  Elevator(3rd floor)  DME Currently at Home: (none)  Home Living - Notes / Comments: DTR checks in occasionaly (not daily)      Subjective:  Patient is agreeable to participation in the therapy session. Nursing clears patient for therapy.  Patient Goal: unstated  Pain Assessment  Pain Assessment: Numeric Scale (0-10)  Pain Score: (pt does not rate numerically)  Pain Location: Hand;Arm  Pain Orientation: Right  Pain Frequency: Continuous  Pain Intervention(s): Rest      Objective:  Observation of Patient/Vital Signs:  Visit Vitals  BP (!) 142/98   Pulse 74   SpO2 95%       Inspection/Posture: abdominal wound, RUE edema    Cognitive Status and Neuro Exam:  Cognition/Neuro Status  Arousal/Alertness: Appropriate responses to stimuli  Attention Span: Appears intact  Orientation Level: Oriented X4  Memory: Appears intact  Following Commands: Follows all commands and directions without difficulty  Safety Awareness: independent  Insights: Fully aware of deficits  Behavior: calm;cooperative;attentive    Musculoskeletal Examination  Gross ROM  Neck/Trunk ROM: within functional limits  Right Upper Extremity ROM: within functional limits(AAROM)  - keeps fingers flexed in fist, PROM to stretch out  Left Upper Extremity ROM: within functional limits  Right Lower Extremity ROM: within functional limits(AAROM)  Left Lower Extremity ROM: within functional limits  Gross Strength  Neck/Trunk Strength: WFL  Right Upper Extremity Strength: 2/5  Left Upper Extremity Strength: 3+/5  Right Lower Extremity Strength: 3-/5  Left Lower Extremity Strength: 3+/5       Functional Mobility:  Functional Mobility  Rolling: Contact Guard Assist  Supine to Sit: Minimal Assist  Scooting to HOB: Contact Guard Assist(seated EOB scooting)  Scooting to EOB: Stand by Assist  Sit to Stand: Nurse, learning disability  Stand to Sit: Contact Guard Assist  Transfers  Bed to Chair: Health and safety inspector to Bed: Youth worker Used for Functional  Transfer: standard walker  Locomotion  Ambulation: Contact Guard Assist;with standard walker(3-4 steps to scale in room)  Pattern: Step to;decreased step length     Balance  Balance  Balance: needs focused assessment  Sitting - Dynamic: Good  Standing - Dynamic: Good(with UE support)    Participation and Activity Tolerance  Participation and Endurance  Participation Effort: good  Endurance: Tolerates 10 - 20 min exercise with multiple rests  [place] Dyspnea Scale: 0 Dyspnea    Educated the patient to role of physical therapy, plan of care, goals of therapy and safety with mobility and ADLs, home safety; patient verbalized understanding.  Patient left in bed with alarm and all other medical equipment in place and call bell and all personal items/needs within reach.  RN notified of session outcome.      Assessment:  [name] is a 64 y.o. female admitted 06/23/2019. Pt would benefit from Physical Therapy to address deficits and increase functional independence.   Pt demo R hemiparesis,parasthesias and pain. Pt completed transfers and mobility with support, limited by pain at present. Pt lives alone and will benefit from continued tx during admission to address deficits and home safety.  PT Assessment  Assessment: Decreased UE strength;Decreased LE strength;Decreased endurance/activity tolerance;Decreased functional mobility;Decreased balance;Gait impairment  Prognosis: Good;With continued PT status post acute discharge  Progress: Improving as expected      Treatment:  - pt completed EOB seated reaching and weight shifting activities without LOB  - pt completed standing weight shifting with BUE support, required assist to grip AD  - pt  amb 3-4steps' with RW and CGA, noted R knee buckling/unsteadiness with self recovery; abe to step up/down from scale with CGA for safety  - instructed on RUE elevation and finger flex/ext as tolerated for edema control      Plan:  Treatment/Interventions: Exercise,  Gait training, Neuromuscular re-education, Functional transfer training, LE strengthening/ROM, Bed mobility, Continued evaluation  PT Frequency: 2-3x/wk(to increase potential of safe d/c home)  Risks/Benefits/POC Discussed with Pt/Family: With patient    PMP Activity: Step 6 - Walks in Room  Distance Walked (ft) (Step 6,7): 3 Feet      Goals:  Goals  Goal Formulation: With patient  Time for Goal Acheivement: By time of discharge  Goals: Select goal  Pt Will Go Supine To Sit: independent, to maximize functional mobility and independence  Pt Will Perform Sit to Stand: with supervision, to maximize functional mobility and independence  Pt Will Transfer Bed/Chair: with rolling walker, with supervision, to maximize functional mobility and independence  Pt Will Ambulate: 31-50 feet, with rolling walker, with stand by assist, to maximize functional mobility and independence         DME Recommended for Discharge: Front wheel walker  Discharge Recommendation: Home with supervision, Home with home health PT, Home with home health OT    Jonna Munro, PT  PRN   06/23/2019  5:18 PM

## 2019-06-23 NOTE — ED Notes (Signed)
[facility]  ED NURSING NOTE FOR THE RECEIVING INPATIENT NURSE   ED NURSE Blima Dessert [phone]   ED CHARGE RN [phone]   ADMISSION INFORMATION   [name] is a 64 y.o. female admitted with a diagnosis of:    1. Cerebrovascular accident (CVA), unspecified mechanism    2. Right sided numbness    3. ESRD (end stage renal disease) on dialysis         Isolation: None   Allergies: Lisinopril and Metformin   Holding Orders confirmed? Yes   Belongings Documented? Yes   Home medications sent to pharmacy confirmed? No   NURSING CARE   Patient Comes From:   Mental Status: Home Independent  alert and oriented   ADL: Needs assistance with ADLs   Ambulation: 1 person assist   Pertinent Information  and Safety Concerns: Pt speaks english, can ambulate short distances with assistance. Pt has right sided arm pain and weakness. Pt is on dialysis, last received dialysis on Saturday per pt.      COVID Test sent to lab? Yes   VITAL SIGNS   Time BP Temp Pulse Resp SpO2   1344 163/74 98.1 76 18 99   CT / NIH   CT Head ordered on this patient?  Yes   NIH/Dysphagia assessment done prior to admission? Yes   PERSONAL PROTECTIVE EQUIPMENT   Gloves and Procedure Mask with Shield   LAB RESULTS   Labs Reviewed   CBC AND DIFFERENTIAL - Abnormal; Notable for the following components:       Result Value    Hgb 8.1 (*)     Hematocrit 27.7 (*)     Platelets 127 (*)     RBC 2.88 (*)     MCV 96.2 (*)     MCHC 29.2 (*)     Monocytes Absolute Automated 1.05 (*)     All other components within normal limits   COMPREHENSIVE METABOLIC PANEL - Abnormal; Notable for the following components:    Glucose 374 (*)     BUN 28.0 (*)     Creatinine 5.6 (*)     Calcium 7.8 (*)     Albumin 2.6 (*)     Alkaline Phosphatase 132 (*)     Globulin 4.0 (*)     Albumin/Globulin Ratio 0.7 (*)     All other components within normal limits   GLUCOSE WHOLE BLOOD - POCT - Abnormal; Notable for the following components:    Whole Blood Glucose POCT  327 (*)     All other components within normal limits   COVID-19 (SARS-COV-2)    Narrative:     o Collect and clearly label specimen type:  o Upper respiratory specimen: One Nasopharyngeal Dry Swab NO  Transport Media.  o Hand deliver to laboratory ASAP  Indication for testing->Extended care facility admission to  semi private room   PT/INR   APTT   TROPONIN I   HEMOLYSIS INDEX   GFR          Ticket to Ride Printed: Yes

## 2019-06-23 NOTE — H&P (Signed)
[facility]      Patient: [name]  Date: 06/23/2019   DOB: 05/30/1955  Admission Date: 06/23/2019   MRN: [id number]  Attending: Marya Landry         Chief Complaint   Patient presents with    Numbness     Right arm     Arm Pain     Right sided      History Gathered From: Patient, past records    HISTORY AND PHYSICAL     [name] is a 64 y.o. female with a PMHx of hypertension, hyperlipidemia, insulin-dependent diabetes mellitus, COPD, ESRD currently on hemodialysis Tuesday Thursday Saturday, peritoneal dialysis associated Pseudomonas/E. coli peritonitis, upper GI bleed secondary to gastritis on 05/13/2019 and anemia of chronic kidney disease who presented with right sided numbness and weakness.  Patient with recent hospitalization from 05/27/2019 to 06/13/2019 due to PD associated peritonitis with Pseudomonas and E. coli s/p starting of hemodialysis on 05/26/2019 and exploratory laparotomy, lysis of adhesion and removal of PD catheter on 05/27/2019.  Patient's last hemodialysis done on 06/21/2019 without any complications.    Patient reported right face, right upper extremity and right lower extremity numbness x3 days associated with weakness of right upper extremity and right hand pain.    Patient denied any fever chills nausea vomiting diarrhea chest pain shortness of breath headache lightheadedness or dizziness    During patient's initial evaluation in the emergency department, patient was found to have fairly stable vitals.  Unchanged laboratory data except for hyperglycemia with glucose of 374.  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    Past Medical History:   Diagnosis Date    Chronic obstructive pulmonary disease     Diabetes mellitus     Hyperlipidemia     Hypertension     Sleep apnea 2018       Past Surgical History:   Procedure Laterality Date    EGD, BIOPSY N/A 05/14/2019    Procedure: EGD, BIOPSY;  Surgeon:  Ronie Spies, MD;  Location: [facility];  Service: Gastroenterology;  Laterality: N/A;    EXPLORATORY LAPAROTOMY N/A 05/27/2019    Procedure: EXPLORATORY LAPAROTOMY;  Surgeon: Herbert Moors., MD;  Location: [facility];  Service: General;  Laterality: N/A;    JOINT REPLACEMENT      shoulder    JOINT REPLACEMENT      knee    OVARY SURGERY Left     Removal    REMOVAL, FOREIGN BODY N/A 05/27/2019    Procedure: REMOVAL, PERITONEAL DIALYSIS CATHETER;  Surgeon: Herbert Moors., MD;  Location: [facility];  Service: General;  Laterality: N/A;    TUNNELED CATH PLACEMENT ([place]) Bilateral 05/23/2019    Procedure: TUNNELED CATH PLACEMENT;  Surgeon: Jacques Earthly, MD;  Location: [facility];  Service: Interventional Radiology;  Laterality: Bilateral;       Prior to Admission medications    Medication Sig Start Date End Date Taking? Authorizing Provider   amLODIPine (NORVASC) 5 MG tablet Take 1 tablet (5 mg total) by mouth every 12 (twelve) hours 05/20/19   Matiana, Larrie Kass D, MD   atorvastatin (LIPITOR) 40 MG tablet Take 40 mg by mouth daily    [provider]   carvedilol (COREG) 25 MG tablet Take 1 tablet (25 mg total) by mouth every 12 (twelve) hours 06/04/19   Delfina Redwood, MD   Dulaglutide (Trulicity)  0.75 MG/0.5ML Solution Pen-injector Inject 0.75 mg into the skin once a week    [provider]   famotidine (PEPCID) 40 MG tablet Take 40 mg by mouth daily    [provider]   gabapentin (NEURONTIN) 600 MG tablet Take 600 mg by mouth 3 (three) times daily    [provider]   insulin aspart (NovoLOG) 100 UNIT/ML injection Inject into the skin 3 (three) times daily before meals    [provider]   insulin glargine (LANTUS) 100 UNIT/ML injection Inject 10 Units into the skin every morning 05/27/19   Dolan Amen D, MD   lactobacillus/streptococcus (RISAQUAD) Cap Take 1 capsule by mouth daily 06/04/19 07/04/19  Delfina Redwood, MD    lidocaine (LIDODERM) 5 % Place 1 patch onto the skin every 24 hours Remove & Discard patch within 12 hours or as directed by MD 06/12/19   Delfina Redwood, MD   oxyCODONE (ROXICODONE) 5 MG immediate release tablet Take 1 tablet (5 mg total) by mouth every 6 (six) hours as needed for Pain 06/04/19   Delfina Redwood, MD   pantoprazole (PROTONIX) 40 MG tablet Take 1 tablet (40 mg total) by mouth 2 (two) times daily 05/20/19   Ardeth Sportsman, MD   sevelamer (RENVELA) 800 MG tablet Take 800 mg by mouth 3 (three) times daily with meals    [provider]   zinc Oxide (DESITIN) 40 % Paste paste Apply topically every 6 (six) hours 06/04/19 07/04/19  Delfina Redwood, MD       Allergies   Allergen Reactions    Lisinopril Hives    Metformin Hives       CODE STATUS: DNR, support okay    PRIMARY CARE MD: Pcp, None, MD    Family History   Problem Relation Age of Onset    Diabetes Mother     Hypertension Mother     Diabetes Father     Hypertension Father     Diabetes Sister     Hypertension Sister     Diabetes Brother     Hypertension Brother     Diabetes Paternal Aunt     Diabetes Paternal Uncle        Social History     Tobacco Use    Smoking status: Never Smoker    Smokeless tobacco: Never Used   Substance Use Topics    Alcohol use: Never    Drug use: Never       REVIEW OF SYSTEMS   Positive for: As mentioned above  Negative for: As mentioned above  All ROS completed and otherwise negative.    PHYSICAL EXAM     Vital Signs (most recent): BP (!) 142/98    Pulse 74    Temp 97.9 F (36.6 C) (Oral)    Resp 16    Ht 1.575 m (5\' 2" )    Wt (S) 106.1 kg (234 lb) Comment: Pt states she was weighted at dialysis on Saturday 234lbs, today bed scale reads 185lbs   LMP  (LMP Unknown) Comment: post menopausal   SpO2 95%    BMI 42.80 kg/m   Constitutional: No apparent distress, Casually groomed and Appears stated age.  Morbidly obese female patient speaks freely in full sentences.   HEENT: NC/AT, PERRL, no  scleral icterus or conjunctival pallor, no nasal discharge, MMM, oropharynx without erythema or exudate  Neck: trachea midline, supple, no cervical or supraclavicular lymphadenopathy or masses  Cardiovascular: RRR, normal S1  S2, no murmurs, gallops, palpable thrills, no JVD, Non-displaced PMI.  Respiratory: Normal rate. No retractions or increased work of breathing. Clear to auscultation and percussion bilaterally.  Gastrointestinal: +BS, non-distended, soft, non-tender, no rebound or guarding, no hepatosplenomegaly  Genitourinary: no suprapubic or costovertebral angle tenderness  Musculoskeletal: ROM and motor strength grossly normal. No clubbing, edema, or cyanosis. DP and radial pulses 2+ and symmetric.  Skin exam:  no pallor  Neurologic: EOMI, CN 2-12 grossly intact except for flattening of right nasolabial fold. no gross motor or sensory deficits except for power 4/5 in right upper extremity.  Tenderness to palpation on right upper extremity  Psychiatric: AAOx3, affect and mood appropriate. The patient is alert, interactive, appropriate.  Capillary refill:  Normal    Exam done by Marya Landry, MD on 06/23/19 at 3:35 PM      LABS & IMAGING     Recent Results (from the past 24 hour(s))   Glucose Whole Blood - POCT    Collection Time: 06/23/19 11:48 AM   Result Value Ref Range    Whole Blood Glucose POCT 327 (H) 70 - 100 mg/dL   CBC and differential    Collection Time: 06/23/19 12:29 PM   Result Value Ref Range    WBC 7.26 3.10 - 9.50 x10 3/uL    Hgb 8.1 (L) 11.4 - 14.8 g/dL    Hematocrit 27.7 (L) 34.7 - 43.7 %    Platelets 127 (L) 142 - 346 x10 3/uL    RBC 2.88 (L) 3.90 - 5.10 x10 6/uL    MCV 96.2 (H) 78.0 - 96.0 fL    MCH 28.1 25.1 - 33.5 pg    MCHC 29.2 (L) 31.5 - 35.8 g/dL    RDW 14 11 - 15 %    MPV 11.6 8.9 - 12.5 fL    Neutrophils 66.6 None %    Lymphocytes Automated 14.7 None %    Monocytes 14.5 None %    Eosinophils Automated 3.2 None %    Basophils Automated 0.4 None %    Immature Granulocytes 0.6 None  %    Nucleated RBC 0.0 0.0 - 0.0 /100 WBC    Neutrophils Absolute 4.84 1.10 - 6.33 x10 3/uL    Lymphocytes Absolute Automated 1.07 0.42 - 3.22 x10 3/uL    Monocytes Absolute Automated 1.05 (H) 0.21 - 0.85 x10 3/uL    Eosinophils Absolute Automated 0.23 0.00 - 0.44 x10 3/uL    Basophils Absolute Automated 0.03 0.00 - 0.08 x10 3/uL    Immature Granulocytes Absolute 0.04 0.00 - 0.07 x10 3/uL    Absolute NRBC 0.00 0.00 - 0.00 x10 3/uL   Comprehensive metabolic panel    Collection Time: 06/23/19 12:29 PM   Result Value Ref Range    Glucose 374 (H) 70 - 100 mg/dL    BUN 28.0 (H) 7.0 - 19.0 mg/dL    Creatinine 5.6 (H) 0.6 - 1.0 mg/dL    Sodium 138 136 - 145 mEq/L    Potassium 4.2 3.5 - 5.1 mEq/L    Chloride 101 100 - 111 mEq/L    CO2 25 22 - 29 mEq/L    Calcium 7.8 (L) 8.5 - 10.5 mg/dL    Protein, Total 6.6 6.0 - 8.3 g/dL    Albumin 2.6 (L) 3.5 - 5.0 g/dL    AST (SGOT) 7 5 - 34 U/L    ALT 8 0 - 55 U/L    Alkaline Phosphatase 132 (H) 37 - 106 U/L  Bilirubin, Total 0.2 0.2 - 1.2 mg/dL    Globulin 4.0 (H) 2.0 - 3.6 g/dL    Albumin/Globulin Ratio 0.7 (L) 0.9 - 2.2    Anion Gap 12.0 5.0 - 15.0   Prothrombin time/INR    Collection Time: 06/23/19 12:29 PM   Result Value Ref Range    PT 11.9 10.1 - 12.9 sec    PT INR 1.0 0.9 - 1.1   APTT    Collection Time: 06/23/19 12:29 PM   Result Value Ref Range    PTT 30 27 - 39 sec   Troponin I    Collection Time: 06/23/19 12:29 PM   Result Value Ref Range    Troponin I 0.02 0.00 - 0.05 ng/mL   Hemolysis index    Collection Time: 06/23/19 12:29 PM   Result Value Ref Range    Hemolysis Index 9 0 - 18   GFR    Collection Time: 06/23/19 12:29 PM   Result Value Ref Range    EGFR 9.2    COVID-19 (SARS-COV-2) ([facility])    Collection Time: 06/23/19  1:47 PM    Specimen: Nasopharynx; Nasopharyngeal Swab   Result Value Ref Range    Purpose of COVID testing Screening     SARS-CoV-2 Specimen Source Nasopharyngeal     SARS CoV 2 Overall Result Negative        MICROBIOLOGY:  Blood Culture:  N/A  Urine Culture: N/A  Antibiotics Started: N/A    IMAGING:  CT Head WO Contrast    Result Date: 06/23/2019   No intra-axial mass lesion or hemorrhage is demonstrated. Steward Drone, MD  06/23/2019 1:26 PM    CT Abd/Pelvis with IV Contrast only    Result Date: 05/27/2019  1. Fluid-filled mildly distended loops of small bowel with distal decompressed small bowel loops. Findings may represent early or partial small bowel obstruction. Enteritis is felt to be less likely. 2. Trace bilateral pleural effusions. 3. Additional chronic findings as above. Delaney Meigs, MD  05/27/2019 6:07 AM    CT Abdomen Pelvis W WO IV Contrast and W PO Contrast    Result Date: 06/09/2019   Small ill-defined collections of fluid anterior to the uterus and along the right posterolateral margin of the uterus may represent abscesses or loculated ascites in a patient with peritonitis Elyn Peers, MD  06/09/2019 9:13 AM      CARDIAC:  EKG Interpretation (upon my review): Normal sinus rhythm with no significant ST/T wave changes  Markers:  Recent Labs   Lab 06/23/19  1229   Troponin I 0.02       EMERGENCY DEPARTMENT COURSE:  Orders Placed This Encounter   Procedures    COVID-19 (SARS-COV-2) ([facility])    CT Head WO Contrast    MRI Brain WO Contrast    CBC and differential    Comprehensive metabolic panel    Prothrombin time/INR    APTT    Troponin I    Hemolysis index    GFR    Lipid panel    Comprehensive metabolic panel    CBC without differential    Magnesium    Diet consistent carbohydrate and renal Protein restriction: 80 GM Protein    Glucose POC    Cardiac monitoring (ED ONLY)    Continuous Pulse Oximetry    Dysphagia Screen    NPO (Including Aspirin) until dysphagia screen done and passed    Nursing Stroke Protocol and NIHSS    ED Holding Orders Expire in  12 Hours    Notify Admitting Attending ( Change in Condition)    Notify Attending of Patient Arrival to Floor within 12 Hours    Notify Physician (Vital Signs)     Notify Physician (Lab Results)    Vital Signs Q4HR    Nursing Swallow Assessment    Neuro checks    Place sequential compression device    Notify Admitting Attending to Enter Stroke Orderset    Vital signs    Pulse Oximetry    Progressive Mobility Protocol    Notify physician    NSG Communication: Glucose POCT order (PRN hypoglycemia)    I/O    Height    Weight    Skin assessment    Nursing communication: Adult Hypoglycemia Treatment Algorithm    Education: Activity    Education: Disease Process & Condition    Education: Pain Management    Education: Falls Risk    Education: Smoking Cessation    Telemetry 24 Hour Protocol    Notify Physician (Critical Blood Glucose Value)    Notify physician (Communication: Document Abnormal Blood Glucose)    Target Glucose Goals    Nursing communication    NSG Communication: Glucose POCT order (PRN hypoglycemia)    If NPO, give half NPH dose    Nursing communication: Adult Hypoglycemia Treatment Algorithm    NSG Communication: Glucose POCT order    NSG Communication: Glucose POCT order    NO CPR - Support OK    ED Unit Sec Comm Order    OT evaluate and treat    OT eval and treat    PT evaluate and treat    PT evaluate and treat    SLP eval and treat    Glucose Whole Blood - POCT    ECG 12 lead    Saline lock IV    Saline lock IV    Admit to Observation    Supervise For Meals Frequency: All meals       ASSESSMENT & PLAN     [name] is a 64 y.o. female admitted under observation with Right sided numbness.    Patient Active Hospital Problem List:   Right sided numbness (06/23/2019)   Right nasolabial fold flattening   Right arm weakness   Right arm pain    Assessment: Patient presenting with right arm pain associated with numbness of right upper and right lower extremity, flattening of right nasolabial fold, and right upper extremity weakness    CT of head negative for acute processes      Plan: Neurology consulted    Get MRI of the  brain to rule out stroke    PT/OT evaluation    Patient also with recent tunneled dialysis catheter placement on right side.  We will get venous duplex of right upper extremity to rule out DVT as possible reason for her right arm pain     ESRD (end stage renal disease) on dialysis (05/24/2019)    Assessment: Currently on hemodialysis T/T/S      Plan: Nephrology Dr. Conley Canal consulted.  Patient will go down for her scheduled hemodialysis tomorrow     Uncontrolled type 2 diabetes mellitus with hyperglycemia (06/23/2019)    Assessment: With hyperglycemia of 374.  Last hemoglobin A1c 9.8 on 05/13/2019      Plan: Continue with home Lantus 10 units daily.  Start medium insulin sliding scale.  Adjust patient's Lantus as needed     Hypertension (06/23/2019)    Assessment: Fairly controlled  Plan: Continue with amlodipine 5 mg daily.  We will also add as needed hydralazine.  Increase amlodipine to 10 mg if blood pressure remains high     Hyperlipidemia (06/23/2019)    Assessment: Chronic and stable      Plan: Continue with Lipitor     History of COPD (06/23/2019)    Assessment: Currently without any signs of exacerbation      Plan: As needed DuoNeb.     Gastritis with hemorrhage (06/23/2019)    Assessment: With history of upper GI bleeding on 05/13/2019 secondary to grade D gastritis      Plan: Continue with home Protonix     History of anemia due to chronic kidney disease (06/23/2019)    Assessment: Currently with stable H/H      Plan: Repeat CBC in the morning        Nutrition  Diabetic renal    DVT/VTE Prophylaxis  SCD/Lovenox    Anticipated medical stability for discharge: 1 to 2 days    Service status/Reason for ongoing hospitalization: Observation/rule out stroke  Anticipated Discharge Needs: To be determined    Dorann Lodge    06/23/2019 4:35 PM  Time Elapsed: 40 minutes

## 2019-06-23 NOTE — ED Triage Notes (Signed)
Vitals per EMS 134/100, HR 83, dexi 484.

## 2019-06-24 ENCOUNTER — Other Ambulatory Visit: Payer: Self-pay

## 2019-06-24 ENCOUNTER — Observation Stay: Payer: 59

## 2019-06-24 DIAGNOSIS — R200 Anesthesia of skin: Secondary | ICD-10-CM

## 2019-06-24 LAB — CBC
Absolute NRBC: 0 10*3/uL (ref 0.00–0.00)
Hematocrit: 30.7 % — ABNORMAL LOW (ref 34.7–43.7)
Hgb: 8.8 g/dL — ABNORMAL LOW (ref 11.4–14.8)
MCH: 28.1 pg (ref 25.1–33.5)
MCHC: 28.7 g/dL — ABNORMAL LOW (ref 31.5–35.8)
MCV: 98.1 fL — ABNORMAL HIGH (ref 78.0–96.0)
MPV: 11.9 fL (ref 8.9–12.5)
Nucleated RBC: 0 /100 WBC (ref 0.0–0.0)
Platelets: 135 10*3/uL — ABNORMAL LOW (ref 142–346)
RBC: 3.13 10*6/uL — ABNORMAL LOW (ref 3.90–5.10)
RDW: 14 % (ref 11–15)
WBC: 6.15 10*3/uL (ref 3.10–9.50)

## 2019-06-24 LAB — GFR
EGFR: 9.2
EGFR: 19

## 2019-06-24 LAB — COMPREHENSIVE METABOLIC PANEL
ALT: 10 U/L (ref 0–55)
AST (SGOT): 11 U/L (ref 5–34)
Albumin/Globulin Ratio: 0.6 — ABNORMAL LOW (ref 0.9–2.2)
Albumin: 2.5 g/dL — ABNORMAL LOW (ref 3.5–5.0)
Alkaline Phosphatase: 129 U/L — ABNORMAL HIGH (ref 37–106)
Anion Gap: 9 (ref 5.0–15.0)
BUN: 30 mg/dL — ABNORMAL HIGH (ref 7.0–19.0)
Bilirubin, Total: 0.1 mg/dL — ABNORMAL LOW (ref 0.2–1.2)
CO2: 26 mEq/L (ref 22–29)
Calcium: 7.8 mg/dL — ABNORMAL LOW (ref 8.5–10.5)
Chloride: 104 mEq/L (ref 100–111)
Creatinine: 5.6 mg/dL — ABNORMAL HIGH (ref 0.6–1.0)
Globulin: 4 g/dL — ABNORMAL HIGH (ref 2.0–3.6)
Glucose: 269 mg/dL — ABNORMAL HIGH (ref 70–100)
Potassium: 4.6 mEq/L (ref 3.5–5.1)
Protein, Total: 6.5 g/dL (ref 6.0–8.3)
Sodium: 139 mEq/L (ref 136–145)

## 2019-06-24 LAB — LIPID PANEL
Cholesterol / HDL Ratio: 5.1
Cholesterol: 182 mg/dL (ref 0–199)
HDL: 36 mg/dL — ABNORMAL LOW (ref 40–9999)
LDL Calculated: 92 mg/dL (ref 0–99)
Triglycerides: 271 mg/dL — ABNORMAL HIGH (ref 34–149)
VLDL Calculated: 54 mg/dL — ABNORMAL HIGH (ref 10–40)

## 2019-06-24 LAB — BASIC METABOLIC PANEL
Anion Gap: 11 (ref 5.0–15.0)
BUN: 14 mg/dL (ref 7.0–19.0)
CO2: 26 mEq/L (ref 22–29)
Calcium: 7.8 mg/dL — ABNORMAL LOW (ref 8.5–10.5)
Chloride: 98 mEq/L — ABNORMAL LOW (ref 100–111)
Creatinine: 3 mg/dL — ABNORMAL HIGH (ref 0.6–1.0)
Glucose: 322 mg/dL — ABNORMAL HIGH (ref 70–100)
Potassium: 3.6 mEq/L (ref 3.5–5.1)
Sodium: 135 mEq/L — ABNORMAL LOW (ref 136–145)

## 2019-06-24 LAB — CK: Creatine Kinase (CK): 25 U/L — ABNORMAL LOW (ref 29–168)

## 2019-06-24 LAB — HEMOLYSIS INDEX
Hemolysis Index: 6 (ref 0–18)
Hemolysis Index: 7 (ref 0–18)
Hemolysis Index: 6 (ref 0–18)
Hemolysis Index: 11 (ref 0–18)

## 2019-06-24 LAB — GLUCOSE WHOLE BLOOD - POCT
Whole Blood Glucose POCT: 254 mg/dL — ABNORMAL HIGH (ref 70–100)
Whole Blood Glucose POCT: 144 mg/dL — ABNORMAL HIGH (ref 70–100)
Whole Blood Glucose POCT: 278 mg/dL — ABNORMAL HIGH (ref 70–100)

## 2019-06-24 LAB — MAGNESIUM: Magnesium: 1.9 mg/dL (ref 1.6–2.6)

## 2019-06-24 MED ORDER — MORPHINE SULFATE 2 MG/ML IJ/IV SOLN (WRAP)
0.5000 mg | Freq: Once | Status: AC
Start: 2019-06-24 — End: 2019-06-24
  Administered 2019-06-24: 04:00:00 0.5 mg via INTRAVENOUS
  Filled 2019-06-24: qty 1

## 2019-06-24 MED ORDER — ASPIRIN 81 MG PO CHEW
81.00 mg | CHEWABLE_TABLET | Freq: Every day | ORAL | Status: DC
Start: 2019-06-24 — End: 2019-06-24
  Administered 2019-06-24: 09:00:00 81 mg via ORAL
  Filled 2019-06-24: qty 1

## 2019-06-24 MED ORDER — SODIUM CHLORIDE 0.9 % IV BOLUS
100.0000 mL | INTRAVENOUS | Status: AC | PRN
Start: 2019-06-24 — End: 2019-06-24

## 2019-06-24 MED ORDER — APIXABAN 5 MG PO TABS
5.0000 mg | ORAL_TABLET | Freq: Two times a day (BID) | ORAL | Status: DC
Start: 2019-06-24 — End: 2019-06-27
  Administered 2019-06-24 – 2019-06-27 (×6): 5 mg via ORAL
  Filled 2019-06-24 (×6): qty 1

## 2019-06-24 MED ORDER — DAKINS (1/4 STRENGTH) 0.125 % EX SOLN
Freq: Two times a day (BID) | CUTANEOUS | Status: DC
Start: 2019-06-24 — End: 2019-06-25
  Filled 2019-06-24: qty 473

## 2019-06-24 MED ORDER — SODIUM CHLORIDE 0.9 % IV BOLUS
250.0000 mL | INTRAVENOUS | Status: AC | PRN
Start: 2019-06-24 — End: 2019-06-24

## 2019-06-24 NOTE — SLP Eval Note (Signed)
[facility]  Speech and Language Therapy Bedside Swallow Evaluation     Patient:  [name] MRN#:  [id number]  Unit:  [place] Room/Bed:  S5681/E7517-00    Time of Treatment:   Time Calculation  SLP Received On: 06/24/19  Start Time: 0945  Stop Time: 1005  Time Calculation (min): 20 min    Consult received for [name] for SLP Bedside Swallow Evaluation and Treatment.    Medical Diagnosis: ESRD (end stage renal disease) on dialysis [N18.6, Z99.2]  Cerebrovascular accident (CVA), unspecified mechanism [I63.9]  Right sided numbness [R20.0]    History of Present Illness: [name] is a 64 y.o. female admitted on 06/23/2019 with a PMHx of hypertension, hyperlipidemia, insulin-dependent diabetes mellitus, COPD, ESRD currently on hemodialysis Tuesday Thursday Saturday, peritoneal dialysis associated Pseudomonas/E. coli peritonitis, upper GI bleed secondary to gastritis on 05/13/2019 and anemia of chronic kidney disease who presented with right sided numbness and weakness.  Patient with recent hospitalization from 05/27/2019 to 06/13/2019 due to PD associated peritonitis with Pseudomonas and E. coli s/p starting of hemodialysis on 05/26/2019 and exploratory laparotomy, lysis of adhesion and removal of PD catheter on 05/27/2019.  Patient's last hemodialysis done on 06/21/2019 without any complications.    Patient reported right face, right upper extremity and right lower extremity numbness x3 days associated with weakness of right upper extremity and right hand pain.    Patient denied any fever chills nausea vomiting diarrhea chest pain shortness of breath headache lightheadedness or dizziness    During patient's initial evaluation in the emergency department, patient was found to have fairly stable vitals.  Unchanged laboratory data except for hyperglycemia with glucose of 374.  Status post CT of head showing no acute processes there were 2 small lipomas in the right  cerebellopontine angle    PPE: Procedure mask, goggles/face shield, gloves, gown     Patient Active Problem List   Diagnosis    Iron deficiency anemia secondary to inadequate dietary iron intake    Sideropenic dysphagia    Peritoneal dialysis catheter dysfunction, initial encounter    Peritonitis    Upper GI bleed    ESRD (end stage renal disease) on dialysis    Generalized abdominal pain    Thrombocytopenia    Hypotension    Type 2 diabetes mellitus, with long-term current use of insulin    GERD (gastroesophageal reflux disease)    Bowel incontinence    Right sided numbness    Uncontrolled type 2 diabetes mellitus with hyperglycemia    Hypertension    Hyperlipidemia    History of COPD    Gastritis with hemorrhage    History of anemia due to chronic kidney disease        Past Medical/Surgical History:  Past Medical History:   Diagnosis Date    Chronic obstructive pulmonary disease     Diabetes mellitus     Hyperlipidemia     Hypertension     Sleep apnea 2018      Past Surgical History:   Procedure Laterality Date    EGD, BIOPSY N/A 05/14/2019    Procedure: EGD, BIOPSY;  Surgeon: Ronie Spies, MD;  Location: [facility];  Service: Gastroenterology;  Laterality: N/A;    EXPLORATORY LAPAROTOMY N/A 05/27/2019    Procedure: EXPLORATORY LAPAROTOMY;  Surgeon: Herbert Moors., MD;  [facility];  Service: General;  Laterality: N/A;    JOINT REPLACEMENT      shoulder  JOINT REPLACEMENT      knee    OVARY SURGERY Left     Removal    REMOVAL, FOREIGN BODY N/A 05/27/2019    Procedure: REMOVAL, PERITONEAL DIALYSIS CATHETER;  Surgeon: Herbert Moors., MD;  Location: [facility];  Service: General;  Laterality: N/A;    TUNNELED CATH PLACEMENT ([place]) Bilateral 05/23/2019    Procedure: TUNNELED CATH PLACEMENT;  Surgeon: Jacques Earthly, MD;  [facility];  Service: Interventional Radiology;  Laterality: Bilateral;         History/Current Status:  Current  Status  Respiratory Status: within normal limits  Behavior/Mental Status: Awake/alert, Able to follow directions, Cooperative  Nutrition: oral    Subjective: Patient is agreeable to participation in the therapy session. Nursing clears patient for therapy. Patients medical condition is appropriate for Speech therapy intervention at this time.    Objective:  Observation of Patient/Vital Signs:  Patient is in bed with telemetry in place.    Oral Motor Skills:  Scientist, research (life sciences) Skills: exceptions to [facility]  Oral Motor Impairments: strength    Deglutition Skills:  Deglutition Skills  Position: upright 90 degrees  Food(s) Tested: thin liquid, solid  Oral Stage: chewing reduced, slow but effective  Pharyngeal Stage: suspect delayed response    Assessment:   Pt reports pain with swallowing (like a sore throat) with things like soda and orange juice.  Oral motor exam revealed some white spots on her tongue and toward anterior tongue- suggestive of possible thrush causing painful swallowing.  Otherwise, pt demonstrated an overal functional swallow.  Pt given trials of approximately 4 oz thin liquid and solids.  Pt demonstrated adequate labial seal with slowed but efficient rotary mastication, suspected adequate bolus consolidation and manipulation, A-P transit, and suspected delayed swallow initiation with adequate hyolaryngeal elevation and excursion upon subjective digital palpitation.  No overt s/s of aspiration or penetration noted.  Pt reports no known difficulties with swallow function at home.      Goals:  1. Pt will safely consume regular/thin liquids using safe swallow strategies with no overt s/s of aspiration or penetration across all PO intake.  2. Pt will safely consume least restrictive diet using safe swallow strategies with no overt s/s of aspiration or penetration across all PO intake.   3. Pt will participate in ongoing assessment/implementation of strategies with SLP for diet modifications as  needed     Plan/Recommendations:           Diet Solids Recommendation: Continue with current diet, regular  Diet Liquids Recommendations: thin consistency  Precautions/Compensations: Awake/alert, Upright 90 degrees for all oral intake, 45 degrees upright after meals, Alternate solids and liquids, Small bites/sips, Eat/feed slowly  Recommendation Discussed With: : Patient, Nurse  SLP Frequency Recommended: follow-up visit only  Administration of Medications: PO  Aspiration Precautions posted at bedside: Reviewed with pt     06/24/2019  Quinn Plowman, M.S., [place] [phone]  Department #: [phone]

## 2019-06-24 NOTE — OT Eval Note (Addendum)
[facility]   Occupational Therapy Attempt Note    Patient:  [name] MRN#:  [id number]  Unit:  [place] Room/Bed:  O5366/Y4034-74      Occupational Therapy evaluation attempted on 06/24/2019 at 8:56 AM unable to complete secondary to patient eating. Therapist will reattempt today.      RN aware.        Barnetta Chapel, [place], OTR/L [phone]  06/24/2019 8:56 AM  -------------------------------------------------------------------------------------------------------------------------------  [facility]   Occupational Therapy Attempt Note    Patient:  [name] MRN#:  [id number]  Unit:  [place] Room/Bed:  F6433/I9518-84      Occupational Therapy evaluation attempted on 06/24/2019 at 10:06 AM unable to complete secondary to per RN, pt leaving unit for doppler and then is scheduled for dialysis treatment. Will attempt to f/u later today as able.     RN aware.        Barnetta Chapel, [place], OTR/L [id number]  06/24/2019 10:06 AM  ------------------------------------------------------------------------------------------------------------------------------  [facility]   Occupational Therapy Attempt Note    Patient:  [name] MRN#:  [id number]  Unit:  [place] Room/Bed:  N2355/D3220-25      Occupational Therapy evaluation attempted on 06/24/2019 at 4:30 PM unable to complete secondary to patient declining 2/2 to increased pain. Therapist will reattempt tomorrow per pt request.           Barnetta Chapel, [place], OTR/L [phone]  06/24/2019 4:30 PM

## 2019-06-24 NOTE — Progress Notes (Signed)
Pt arrived to the HD unit S/P R arm ultrasound. AOX4 she complained 5/10 pain R arm and hand, HD nurse noted armhand swollen (medicated prior for pain). Time out and safety checks complete, Consent verified. R CVC with gauze due for change, both ports WDL. Orders reviewed. HD initiated with no problems. HD nurse will monitor closely.         06/24/19 1105   Vitals   Heart Rate 74   Resp Rate 18   BP 149/69   SpO2 97 %   O2 Device None (Room air)   Machine Metrics   $Treatment Started/Capturing Charge Yes   Blood Flow Rate (mL/min) 150 mL/min   Arterial Pressure (mmHg) -30 mmHg   Venous Pressure (mmHg) 40   Dialysate Flow Rate (mL/min) 700 mL/min   Transmembrane Pressure (mmHg) 30 mmHg   Ultrafiltration Rate (mL/Hr) 690 mL/hr   Fluid Removal (ml) 0 ml   Fluid Bolus (ml) 200 ml   Dialysate K (mEq/L) 2 mEq/L  (per KPP )   Dialysate CA (mEq/L) 2.5 mEq/L   Hemodialysis Comments   Arteriovenous Lines Secure Yes   Comments HD initiated R CVC        06/24/19 1105   Vitals   Heart Rate 74   Resp Rate 18   BP 149/69   SpO2 97 %   O2 Device None (Room air)   Machine Metrics   $Treatment Started/Capturing Charge Yes   Blood Flow Rate (mL/min) 150 mL/min   Arterial Pressure (mmHg) -30 mmHg   Venous Pressure (mmHg) 40   Dialysate Flow Rate (mL/min) 700 mL/min   Transmembrane Pressure (mmHg) 30 mmHg   Ultrafiltration Rate (mL/Hr) 690 mL/hr   Fluid Removal (ml) 0 ml   Fluid Bolus (ml) 200 ml   Dialysate K (mEq/L) 2 mEq/L  (per KPP )   Dialysate CA (mEq/L) 2.5 mEq/L   Hemodialysis Comments   Arteriovenous Lines Secure Yes   Comments HD initiated R CVC

## 2019-06-24 NOTE — Consults (Signed)
[facility]  CONSULT  Aaron Edelman, x [phone] ([place])      Date Time: 06/24/19 1:36 PM  Patient Name: [name]  Requesting Physician: Dolphus Jenny, MD  Consulting Physician: Verl Blalock, MD    Primary Care Physician: Pcp, None, MD    Reason for Consultation: ESRD,provision of hemodialysis      Assessment:     Patient Active Problem List   Diagnosis    Iron deficiency anemia secondary to inadequate dietary iron intake    Sideropenic dysphagia    Peritoneal dialysis catheter dysfunction, initial encounter    Peritonitis    Upper GI bleed    ESRD (end stage renal disease) on dialysis    Generalized abdominal pain    Thrombocytopenia    Hypotension    Type 2 diabetes mellitus, with long-term current use of insulin    GERD (gastroesophageal reflux disease)    Bowel incontinence    Right sided numbness    Uncontrolled type 2 diabetes mellitus with hyperglycemia    Hypertension    Hyperlipidemia    History of COPD    Gastritis with hemorrhage    History of anemia due to chronic kidney disease     1. ESRD on HD TTS at [place], follows with Dr. Aline August.  2. RUE pain/RLE numbness: imaging negative for acute CVA; symptoms persist  3. Anemia of chronic disease  4.HTN  5. Secondary hyperparathyroidism    Recommendations:     Presently being dialyzed  Next treatment on Thursday, TTS  Pending read from RUE ultrasound  Continue sevelamer; check phos in AM  Aranesp      Tirrell, Ashley Mariner, MD, thank you for this consultation.  We will follow the patient with you during this hospitalization.  Please contact me with any questions or issues.    Signed by: Verl Blalock, MD  [facility]  [phone] (office)  x [phone] ([place])      -----------------------------------------------------------------------------------------------------------        History of Presenting Illness:   [name] is a 64 y.o. female with ESRD iniitally maintained  on PD, transitioned to HD on 05/26/19 after PD associated peritonitis, HTN, anemia of chronic disease who presented to the hospital with one day history of RUE pain and RLE numbness. Nephrology consulted for provision of hemodialysis.     [name] reported one day history of gradual onset R shoulder/RUE pain with RLE numbness. Denied fever, chills, chest pain, nausea, vomiting or diarrhea. Upon arrival to the ED, found to be hemodynamically stable. Imaging negative for acute CVA.RUE ultrasound done this morning; pending read.    Past Medical History:     Past Medical History:   Diagnosis Date    Chronic obstructive pulmonary disease     Diabetes mellitus     Hyperlipidemia     Hypertension     Sleep apnea 2018         Past Surgical History:     Past Surgical History:   Procedure Laterality Date    EGD, BIOPSY N/A 05/14/2019    Procedure: EGD, BIOPSY;  Surgeon: Ronie Spies, MD;  Location: [facility];  Service: Gastroenterology;  Laterality: N/A;    EXPLORATORY LAPAROTOMY N/A 05/27/2019    Procedure: EXPLORATORY LAPAROTOMY;  Surgeon: Herbert Moors., MD;  Location: [facility];  Service: General;  Laterality: N/A;    JOINT REPLACEMENT      shoulder    JOINT REPLACEMENT  knee    OVARY SURGERY Left     Removal    REMOVAL, FOREIGN BODY N/A 05/27/2019    Procedure: REMOVAL, PERITONEAL DIALYSIS CATHETER;  Surgeon: Herbert Moors., MD;  Location: [facility];  Service: General;  Laterality: N/A;    TUNNELED CATH PLACEMENT ([place]) Bilateral 05/23/2019    Procedure: TUNNELED CATH PLACEMENT;  Surgeon: Jacques Earthly, MD;  Location: [facility];  Service: Interventional Radiology;  Laterality: Bilateral;       Family History:     Family History   Problem Relation Age of Onset    Diabetes Mother     Hypertension Mother     Diabetes Father     Hypertension Father     Diabetes Sister     Hypertension Sister     Diabetes Brother     Hypertension Brother     Diabetes Paternal  Aunt     Diabetes Paternal Uncle        Social History:     Social History     Socioeconomic History    Marital status: Legally Separated     Spouse name: Not on file    Number of children: Not on file    Years of education: Not on file    Highest education level: Not on file   Occupational History    Not on file   Tobacco Use    Smoking status: Never Smoker    Smokeless tobacco: Never Used   Substance and Sexual Activity    Alcohol use: Never    Drug use: Never    Sexual activity: Not Currently   Other Topics Concern    Not on file   Social History Narrative    Not on file     Social Determinants of Health     Financial Resource Strain:     Difficulty of Paying Living Expenses:    Food Insecurity:     Worried About Charity fundraiser in the Last Year:     Arboriculturist in the Last Year:    Transportation Needs:     Film/video editor (Medical):     Lack of Transportation (Non-Medical):    Physical Activity:     Days of Exercise per Week:     Minutes of Exercise per Session:    Stress:     Feeling of Stress :    Social Connections:     Frequency of Communication with Friends and Family:     Frequency of Social Gatherings with Friends and Family:     Attends Religious Services:     Active Member of Clubs or Organizations:     Attends Archivist Meetings:     Marital Status:    Intimate Partner Violence:     Fear of Current or Ex-Partner:     Emotionally Abused:     Physically Abused:     Sexually Abused:        Allergies:     Allergies   Allergen Reactions    Lisinopril Hives    Metformin Hives       Medications:     Medications Prior to Admission   Medication Sig    amLODIPine (NORVASC) 5 MG tablet Take 1 tablet (5 mg total) by mouth every 12 (twelve) hours    atorvastatin (LIPITOR) 40 MG tablet Take 40 mg by mouth daily    carvedilol (COREG) 25 MG tablet Take 1 tablet (25 mg total) by  mouth every 12 (twelve) hours    Dulaglutide (Trulicity) 3.25 QD/8.2ME  Solution Pen-injector Inject 0.75 mg into the skin once a week    famotidine (PEPCID) 40 MG tablet Take 40 mg by mouth daily    gabapentin (NEURONTIN) 600 MG tablet Take 600 mg by mouth 3 (three) times daily    insulin aspart (NovoLOG) 100 UNIT/ML injection Inject into the skin 3 (three) times daily before meals    insulin glargine (LANTUS) 100 UNIT/ML injection Inject 10 Units into the skin every morning    lactobacillus/streptococcus (RISAQUAD) Cap Take 1 capsule by mouth daily    lidocaine (LIDODERM) 5 % Place 1 patch onto the skin every 24 hours Remove & Discard patch within 12 hours or as directed by MD    oxyCODONE (ROXICODONE) 5 MG immediate release tablet Take 1 tablet (5 mg total) by mouth every 6 (six) hours as needed for Pain    pantoprazole (PROTONIX) 40 MG tablet Take 1 tablet (40 mg total) by mouth 2 (two) times daily    sevelamer (RENVELA) 800 MG tablet Take 800 mg by mouth 3 (three) times daily with meals    zinc Oxide (DESITIN) 40 % Paste paste Apply topically every 6 (six) hours        Scheduled Meds: PRN Meds:    amLODIPine, 5 mg, Oral, Q12H  aspirin, 81 mg, Oral, Daily  atorvastatin, 40 mg, Oral, Daily  carvedilol, 25 mg, Oral, Q12H  enoxaparin, 40 mg, Subcutaneous, Daily  gabapentin, 600 mg, Oral, TID  insulin glargine, 10 Units, Subcutaneous, QAM  insulin lispro, 1-4 Units, Subcutaneous, QHS  insulin lispro, 1-8 Units, Subcutaneous, TID AC  lactobacillus/streptococcus, 1 capsule, Oral, Daily  pantoprazole, 40 mg, Oral, BID  sevelamer, 800 mg, Oral, TID MEALS          Continuous Infusions:   acetaminophen, 650 mg, Q6H PRN   Or  acetaminophen, 650 mg, Q6H PRN  albuterol-ipratropium, 3 mL, Q6H PRN  dextrose, 15 g of glucose, PRN   And  dextrose, 12.5 g, PRN  dextrose, 15 g of glucose, PRN   And  dextrose, 12.5 g, PRN   And  glucagon (rDNA), 1 mg, PRN  hydrALAZINE, 25 mg, Q6H PRN  melatonin, 3 mg, QHS PRN  naloxone, 0.2 mg, PRN  ondansetron, 4 mg, Q6H PRN   Or  ondansetron, 4 mg, Q6H  PRN  oxyCODONE, 5 mg, Q6H PRN  sodium chloride, 100 mL, Q1H PRN  sodium chloride, 250 mL, PRN            Review of Systems:   A comprehensive review of systems was per the HPI and below:     General ROS: no f/c  HEENT ROS: no blurry vision, no oral lesions, no epistaxis  Respiratory ROS: negative for cough, shortness of breath, or wheezing  Cardiovascular ROS: negative for chest pain or dyspnea on exertion  Gastrointestinal ROS: negative for abd/flank pain, change in bowel habits  Genito-Urinary ROS: negative for dysuria, hematuria, difficulty voiding or nocturia  Musculoskeletal ROS:  right shoulder &RUE pain  Neurological ROS: right lower extremity numbness   Dermatological ROS: no new rashes or lesions      Physical Exam:     Vitals:    06/24/19 1230 06/24/19 1245 06/24/19 1300 06/24/19 1315   BP: 143/69 147/67 139/63 130/66   Pulse: 70 68 67 68   Resp: 18 18 18 18    Temp:       TempSrc:  SpO2: 98% 98% 98% 98%   Weight:       Height:           Intake and Output Summary (Last 24 hours) at Date Time    Intake/Output Summary (Last 24 hours) at 06/24/2019 1336  Last data filed at 06/24/2019 0800  Gross per 24 hour   Intake --   Output 0 ml   Net 0 ml       Recent weights:  Weight Monitoring 06/11/2019 06/12/2019 06/13/2019 06/23/2019 06/23/2019 06/23/2019 06/24/2019   Height - - - 157.5 cm - 157.5 cm -   Height Method - - - Stated - - -   Weight 119 kg 119 kg 119 kg 106.142 kg 106.731 kg 106.7 kg 111.4 kg   Weight Method Bed Scale Bed Scale Bed Scale Stated Standing Scale - Bed Scale   BMI (calculated) - - - 42.9 kg/m2 - 43.1 kg/m2 -       General: awake, alert, oriented x 3, no acute distress.  HEENT: sclera anicteric  Neck: supple  Cardiovascular: regular rate and rhythm  Lungs: clear to auscultation bilaterally, bilateral air entry, normal work of breathing  Abdomen: soft, NT  Extremities: no edema; swollen RUE  Neuro: A+O x 3, no gross motor/sensory deficits    Access: R HDTC      Labs:     Recent Labs   Lab  06/24/19  0503 06/23/19  1229   WBC 6.15 7.26   Hgb 8.8* 8.1*   Hematocrit 30.7* 27.7*   Platelets 135* 127*     Recent Labs   Lab 06/24/19  0503 06/23/19  1229   Sodium 139 138   Potassium 4.6 4.2   Chloride 104 101   CO2 26 25   BUN 30.0* 28.0*   Creatinine 5.6* 5.6*   Calcium 7.8* 7.8*   Albumin 2.5* 2.6*   Magnesium 1.9  --    Glucose 269* 374*   EGFR 9.2 9.2           cc: Dolphus Jenny, MD  Pcp, None, MD

## 2019-06-24 NOTE — Progress Notes (Signed)
Pt AOx4, RA, continues to c/o pain in her RUE- presents with edema, warm to touch and painful to any motion / touch. Pt has limited mobility- pt does have Rings on her Fingers that RN (myself) was unable to remove- attempted with lotion / soap water with no success due to the edema. Will attempt again after dialysis. U.S of the RUE completed pending results. Report / Handoff given to RN Caitlyn.: Pt off unit for dialyis at 1300 when report was given.

## 2019-06-24 NOTE — Progress Notes (Signed)
Pt NIH 1 ( related to Rt arm weakness). Pt stated her sensations are now normal and has resolved. Pt did c/o pain in her RUE. Rt UE presents with edema and pain. Arm elevated on pillow. Call bell and phone within reach, pt resting in bed.

## 2019-06-24 NOTE — Progress Notes (Signed)
[facility] Consultation Note                                       Date Time: 06/24/19 8:39 AM  Patient Name: [name]  Requesting Physician: Dolphus Jenny, MD  Date of Admission: 06/23/2019    CC / Reason for Consultation: numbness          Assessment:     64y/o woman with hx of DM, ESRD on HD presenting with 3 day history of numbness involving her right arm and leg. Exam notable for flattening of right NLF, decreased sensation RUE/LE and weakness in RUE. CT head imaging reviewed and no acute process noted.     MRI brain negative for acute infarct. Cannot r/o out a tia but this would not explain her current main concern of RUE pain and swelling.       Plan:     --ASA 81mg  daily  --Lipid panel pending  --check CK  --RUE duplex ultrasound pending  --Neurology will follow along as needed      24hr events      She reports numbness has resolved. Continues to have severe pain and welling in her RUE    Past Medical Hx     Past Medical History:   Diagnosis Date    Chronic obstructive pulmonary disease     Diabetes mellitus     Hyperlipidemia     Hypertension     Sleep apnea 2018          Past Surgical Hx:     Past Surgical History:   Procedure Laterality Date    EGD, BIOPSY N/A 05/14/2019    Procedure: EGD, BIOPSY;  Surgeon: Ronie Spies, MD;  Location: [facility];  Service: Gastroenterology;  Laterality: N/A;    EXPLORATORY LAPAROTOMY N/A 05/27/2019    Procedure: EXPLORATORY LAPAROTOMY;  Surgeon: Herbert Moors., MD;  Location: [facility];  Service: General;  Laterality: N/A;    JOINT REPLACEMENT      shoulder    JOINT REPLACEMENT      knee    OVARY SURGERY Left     Removal    REMOVAL, FOREIGN BODY N/A 05/27/2019    Procedure: REMOVAL, PERITONEAL DIALYSIS CATHETER;  Surgeon: Herbert Moors., MD;  Location: [facility];  Service: General;  Laterality: N/A;    TUNNELED CATH PLACEMENT ([place]) Bilateral 05/23/2019    Procedure: TUNNELED CATH PLACEMENT;   Surgeon: Jacques Earthly, MD;  Location: [facility];  Service: Interventional Radiology;  Laterality: Bilateral;        Family Medical History:      Family History   Problem Relation Age of Onset    Diabetes Mother     Hypertension Mother     Diabetes Father     Hypertension Father     Diabetes Sister     Hypertension Sister     Diabetes Brother     Hypertension Brother     Diabetes Paternal Aunt     Diabetes Paternal Uncle        Social Hx     Social History     Socioeconomic History    Marital status: Legally Separated     Spouse name: Not on file    Number of children: Not on file    Years of education: Not on file    Highest education level: Not  on file   Occupational History    Not on file   Tobacco Use    Smoking status: Never Smoker    Smokeless tobacco: Never Used   Substance and Sexual Activity    Alcohol use: Never    Drug use: Never    Sexual activity: Not Currently   Other Topics Concern    Not on file   Social History Narrative    Not on file     Social Determinants of Health     Financial Resource Strain:     Difficulty of Paying Living Expenses:    Food Insecurity:     Worried About Charity fundraiser in the Last Year:     Arboriculturist in the Last Year:    Transportation Needs:     Film/video editor (Medical):     Lack of Transportation (Non-Medical):    Physical Activity:     Days of Exercise per Week:     Minutes of Exercise per Session:    Stress:     Feeling of Stress :    Social Connections:     Frequency of Communication with Friends and Family:     Frequency of Social Gatherings with Friends and Family:     Attends Religious Services:     Active Member of Clubs or Organizations:     Attends Music therapist:     Marital Status:    Intimate Partner Violence:     Fear of Current or Ex-Partner:     Emotionally Abused:     Physically Abused:     Sexually Abused:        Meds     Home :   Prior to Admission medications    Medication  Sig Start Date End Date Taking? Authorizing Provider   amLODIPine (NORVASC) 5 MG tablet Take 1 tablet (5 mg total) by mouth every 12 (twelve) hours 05/20/19   Matiana, Rollen Sox, MD   atorvastatin (LIPITOR) 40 MG tablet Take 40 mg by mouth daily    [provider]   carvedilol (COREG) 25 MG tablet Take 1 tablet (25 mg total) by mouth every 12 (twelve) hours 06/04/19   Delfina Redwood, MD   Dulaglutide (Trulicity) 5.72 IO/0.3TD Solution Pen-injector Inject 0.75 mg into the skin once a week    [provider]   famotidine (PEPCID) 40 MG tablet Take 40 mg by mouth daily    [provider]   gabapentin (NEURONTIN) 600 MG tablet Take 600 mg by mouth 3 (three) times daily    [provider]   insulin aspart (NovoLOG) 100 UNIT/ML injection Inject into the skin 3 (three) times daily before meals    [provider]   insulin glargine (LANTUS) 100 UNIT/ML injection Inject 10 Units into the skin every morning 05/27/19   Dolan Amen D, MD   lactobacillus/streptococcus (RISAQUAD) Cap Take 1 capsule by mouth daily 06/04/19 07/04/19  Delfina Redwood, MD   lidocaine (LIDODERM) 5 % Place 1 patch onto the skin every 24 hours Remove & Discard patch within 12 hours or as directed by MD 06/12/19   Delfina Redwood, MD   oxyCODONE (ROXICODONE) 5 MG immediate release tablet Take 1 tablet (5 mg total) by mouth every 6 (six) hours as needed for Pain 06/04/19   Delfina Redwood, MD   pantoprazole (PROTONIX) 40 MG tablet Take 1 tablet (40 mg total) by mouth 2 (two) times daily 05/20/19  Ardeth Sportsman, MD   sevelamer (RENVELA) 800 MG tablet Take 800 mg by mouth 3 (three) times daily with meals    [provider]   zinc Oxide (DESITIN) 40 % Paste paste Apply topically every 6 (six) hours 06/04/19 07/04/19  Delfina Redwood, MD      Inpatient :       Allergies    Lisinopril and Metformin      Review of Systems     All other systems were reviewed and are negative except for that  mentioned in the HPI    Physical Exam:   Temp:  [97.5 F (36.4 C)-98.9 F (37.2 C)] 98.2 F (36.8 C)  Heart Rate:  [73-84] 77  Resp Rate:  [14-18] 17  BP: (120-163)/(65-98) 156/83       General: in no acute distress. Cooperative with the exam  Neck: supple  CVS: warm and well perfused  Resp: no respiratory distress  Extremities: swelling of distal RUE, proximally warm to touch    Neurological Examination:  MSE: A&Ox3, speech fluent without word-finding difficulty or paraphasic errors  CN: Pupils 4-->95mm b/l, EOMI, no nystagmus,  reports sensation in V1-V3 intact to LT/temp, flattening of right NLF but symmetric smile, tongue and palate midline.  No dysarthria.  Motor:LUE and LLE 5/5. Difficult to assess RUE due to reported pain but proximal appears 4/5, distal 4/5 (likelypain related).   Sensory: LT intact throughout      Labs:     Results     Procedure Component Value Units Date/Time    Glucose Whole Blood - POCT [[id number]]  (Abnormal) Collected: 06/24/19 0809     Updated: 06/24/19 0820     Whole Blood Glucose POCT 254 mg/dL     GFR [[id number]] Collected: 06/24/19 0503     Updated: 06/24/19 0550     EGFR 9.2    Comprehensive metabolic panel [[id number]]  (Abnormal) Collected: 06/24/19 0503    Specimen: Blood Updated: 06/24/19 0550     Glucose 269 mg/dL      BUN 30.0 mg/dL      Creatinine 5.6 mg/dL      Sodium 139 mEq/L      Potassium 4.6 mEq/L      Chloride 104 mEq/L      CO2 26 mEq/L      Calcium 7.8 mg/dL      Protein, Total 6.5 g/dL      Albumin 2.5 g/dL      AST (SGOT) 11 U/L      ALT 10 U/L      Alkaline Phosphatase 129 U/L      Bilirubin, Total 0.1 mg/dL      Globulin 4.0 g/dL      Albumin/Globulin Ratio 0.6     Anion Gap 9.0    Magnesium [[id number]] Collected: 06/24/19 0503    Specimen: Blood Updated: 06/24/19 0550     Magnesium 1.9 mg/dL     Hemolysis index [[id number]] Collected: 06/24/19 0503     Updated: 06/24/19 0550     Hemolysis Index 6    CBC without differential [[id number]]  (Abnormal) Collected:  06/24/19 0503    Specimen: Blood Updated: 06/24/19 0517     WBC 6.15 x10 3/uL      Hgb 8.8 g/dL      Hematocrit 30.7 %      Platelets 135 x10 3/uL      RBC 3.13 x10 6/uL      MCV 98.1 fL  MCH 28.1 pg      MCHC 28.7 g/dL      RDW 14 %      MPV 11.9 fL      Nucleated RBC 0.0 /100 WBC      Absolute NRBC 0.00 x10 3/uL     Lipid panel [[id number]] Collected: 06/24/19 0503    Specimen: Blood Updated: 06/24/19 0504    Narrative:      Fasting specimen    Glucose Whole Blood - POCT [[id number]]  (Abnormal) Collected: 06/23/19 2130     Updated: 06/23/19 2137     Whole Blood Glucose POCT 283 mg/dL     Glucose Whole Blood - POCT [[id number]]  (Abnormal) Collected: 06/23/19 1623     Updated: 06/23/19 1642     Whole Blood Glucose POCT 250 mg/dL     COVID-19 (SARS-COV-2) ([place]) [[id number]] Collected: 06/23/19 1347    Specimen: Nasopharyngeal Swab from Nasopharynx Updated: 06/23/19 1414     Purpose of COVID testing Screening     SARS-CoV-2 Specimen Source Nasopharyngeal     SARS CoV 2 Overall Result Negative    Narrative:      o Collect and clearly label specimen type:  o Upper respiratory specimen: One Nasopharyngeal Dry Swab NO  Transport Media.  o Hand deliver to laboratory ASAP  Indication for testing->Extended care facility admission to  semi private room    Troponin I [[id number]] Collected: 06/23/19 1229    Specimen: Blood Updated: 06/23/19 1301     Troponin I 0.02 ng/mL     CBC and differential [[id number]]  (Abnormal) Collected: 06/23/19 1229    Specimen: Blood Updated: 06/23/19 1300     WBC 7.26 x10 3/uL      Hgb 8.1 g/dL      Hematocrit 27.7 %      Platelets 127 x10 3/uL      RBC 2.88 x10 6/uL      MCV 96.2 fL      MCH 28.1 pg      MCHC 29.2 g/dL      RDW 14 %      MPV 11.6 fL      Neutrophils 66.6 %      Lymphocytes Automated 14.7 %      Monocytes 14.5 %      Eosinophils Automated 3.2 %      Basophils Automated 0.4 %      Immature Granulocytes 0.6 %      Nucleated RBC 0.0 /100 WBC      Neutrophils Absolute 4.84  x10 3/uL      Lymphocytes Absolute Automated 1.07 x10 3/uL      Monocytes Absolute Automated 1.05 x10 3/uL      Eosinophils Absolute Automated 0.23 x10 3/uL      Basophils Absolute Automated 0.03 x10 3/uL      Immature Granulocytes Absolute 0.04 x10 3/uL      Absolute NRBC 0.00 x10 3/uL     Comprehensive metabolic panel [[id number]]  (Abnormal) Collected: 06/23/19 1229    Specimen: Blood Updated: 06/23/19 1255     Glucose 374 mg/dL      BUN 28.0 mg/dL      Creatinine 5.6 mg/dL      Sodium 138 mEq/L      Potassium 4.2 mEq/L      Chloride 101 mEq/L      CO2 25 mEq/L      Calcium 7.8 mg/dL      Protein, Total 6.6 g/dL  Albumin 2.6 g/dL      AST (SGOT) 7 U/L      ALT 8 U/L      Alkaline Phosphatase 132 U/L      Bilirubin, Total 0.2 mg/dL      Globulin 4.0 g/dL      Albumin/Globulin Ratio 0.7     Anion Gap 12.0    Hemolysis index [[id number]] Collected: 06/23/19 1229     Updated: 06/23/19 1255     Hemolysis Index 9    GFR [[id number]] Collected: 06/23/19 1229     Updated: 06/23/19 1255     EGFR 9.2    Prothrombin time/INR [[id number]] Collected: 06/23/19 1229    Specimen: Blood Updated: 06/23/19 1245     PT 11.9 sec      PT INR 1.0    APTT [[id number]] Collected: 06/23/19 1229     Updated: 06/23/19 1245     PTT 30 sec           Rads:     Results for orders placed or performed during the hospital encounter of 06/23/19   CT Head WO Contrast    Narrative    INDICATION: Cerebrovascular accident.    TECHNIQUE: Multiple transaxial images of the head were obtained. No  intravenous contrast was administered. A combination of automatic  exposure control, adjustment of the mA and or KV according to patient  size, and/or use of iterative reconstruction technique was utilized.    FINDINGS: No intra-axial mass lesion, hemorrhage, or territorial  infarction is demonstrated. Ventricular system is within normal limits.  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 No  regions of abnormal density are noted within  the brain parenchyma. The  visualized portions of the sinuses and mastoid air cells are clear.      Impression     No intra-axial mass lesion or hemorrhage is demonstrated.    Steward Drone, MD   06/23/2019 1:26 PM           Jim Like  [facility]  [place] [phone]

## 2019-06-24 NOTE — Progress Notes (Signed)
HD complete and tolerated well. 2 liters net fluid removed. Pt AOX4 post tx. She still complains or right hand and arm pain 5/10. R CVC WDL post tx. Dressing changed per protocol. V/S stable. Report given to the primary nurse.       06/24/19 1445   Treatment Summary   Time Off Machine 1435   Duration of Treatment (Hours) 3.5   Treatment Type 1:1   Dialyzer Clearance Moderately streaked   Fluid Volume Off (mL) 2400   Prime Volume (mL) 200   Rinseback Volume (mL) 200   Fluid Given: Normal Saline (mL) 0   Fluid Given: PRBC  0 mL   Fluid Given: Albumin (mL) 0   Fluid Given: Other (mL) 0   Total Fluid Given 400   Hemodialysis Net Fluid Removed 2000   Post Treatment Assessment   Post-Treatment Weight (Kg) -2   Patient Response to Treatment tolerated well   Information for Next Treatment MWF   Additional Dialyzer Used 0   Temporary Catheter - Non-Tunneled 05/23/19 Right   Placement Date/Time: 05/23/19 1352   Orientation: Right   Dressing Status Clean;Dry;Intact   Dressing/Line Intervention Dressing changed   Dressing Change Assistant ([facility] Only) Carrolyn Meiers RN   Line Used For Blood Draw Yes   Dressing Change Due 06/30/19   Vitals   Temp 98.3 F (36.8 C)   Heart Rate 67   Resp Rate 18   BP 159/70   SpO2 98 %   O2 Device None (Room air)   Pain Assessment   Charting Type Reassessment   Pain Scale Used Numeric Scale (0-10)   Numeric Pain Scale   Pain Score 5   POSS Score 1   Pain Location Hand;Arm   Pain Orientation Right   Pain Descriptors Burning   Pain Frequency Continuous   Effect of Pain on Daily Activities moderate   Patient's Stated Comfort Functional Goal 2   Pain Intervention(s) Rest   Multiple Pain Sites No   Education   Person taught Patient   Knowledge basis Substantial   Topics taught Access care;Procedure   Teaching Tools Explain   Reponse Verbalizes Understanding   Bedside Nurse Communication   Name of bedside RN - post dialysis Srivats Premkumar RN

## 2019-06-24 NOTE — UM Notes (Signed)
[facility] Utilization Review  NPI: [id number], Tax ID: [id number]  Please Call Janalyn Rouse @ [phone] with any questions or concerns. Confidential voicemail.  Email: [email or web address]   Fax final authorizations and requests for additional information to [phone]      06/23/19 1355    Admit to Observation Once   Comments: 25  Diagnosis: Cerebrovascular Accident (Cva), Unspecified Mechanism   Level of Care: Intermediate Care   Admitting Physician Marya Landry      06/23/19 Medicine Assessment/Plan:  64 y.o. female with a PMHx of hypertension, hyperlipidemia, insulin-dependent diabetes mellitus, COPD, ESRD currently on hemodialysis Tuesday Thursday Saturday, peritoneal dialysis associated Pseudomonas/E. coli peritonitis, upper GI bleed secondary to gastritis on 05/13/2019 and anemia of chronic kidney disease who presented with right sided numbness and weakness.  Patient with recent hospitalization from 05/27/2019 to 06/13/2019 due to PD associated peritonitis with Pseudomonas and E. coli s/p starting of hemodialysis on 05/26/2019 and exploratory laparotomy, lysis of adhesion and removal of PD catheter on 05/27/2019.  Patient's last hemodialysis done on 06/21/2019 without any complications.    Patient reported right face, right upper extremity and right lower extremity numbness x3 days associated with weakness of right upper extremity and right hand pain.    Patient denied any fever chills nausea vomiting diarrhea chest pain shortness of breath headache lightheadedness or dizziness    During patient's initial evaluation in the emergency department, patient was found to have fairly stable vitals.  Unchanged laboratory data except for hyperglycemia with glucose of 374.  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    Patient Active Hospital Problem List:   Right sided numbness (06/23/2019)   Right nasolabial fold flattening   Right  arm weakness   Right arm pain    Assessment: Patient presenting with right arm pain associated with numbness of right upper and right lower extremity, flattening of right nasolabial fold, and right upper extremity weakness    CT of head negative for acute processes      Plan: Neurology consulted    Get MRI of the brain to rule out stroke    PT/OT evaluation    Patient also with recent tunneled dialysis catheter placement on right side.  We will get venous duplex of right upper extremity to rule out DVT as possible reason for her right arm pain     ESRD (end stage renal disease) on dialysis (05/24/2019)    Assessment: Currently on hemodialysis T/T/S      Plan: Nephrology Dr. Conley Canal consulted.  Patient will go down for her scheduled hemodialysis tomorrow     Uncontrolled type 2 diabetes mellitus with hyperglycemia (06/23/2019)    Assessment: With hyperglycemia of 374.  Last hemoglobin A1c 9.8 on 05/13/2019      Plan: Continue with home Lantus 10 units daily.  Start medium insulin sliding scale.  Adjust patient's Lantus as needed     Hypertension (06/23/2019)    Assessment: Fairly controlled      Plan: Continue with amlodipine 5 mg daily.  We will also add as needed hydralazine.  Increase amlodipine to 10 mg if blood pressure remains high     Hyperlipidemia (06/23/2019)    Assessment: Chronic and stable      Plan: Continue with Lipitor     History of COPD (06/23/2019)    Assessment: Currently without any signs of exacerbation      Plan: As needed DuoNeb.  Gastritis with hemorrhage (06/23/2019)    Assessment: With history of upper GI bleeding on 05/13/2019 secondary to grade D gastritis      Plan: Continue with home Protonix     History of anemia due to chronic kidney disease (06/23/2019)    Assessment: Currently with stable H/H      Plan: Repeat CBC in the morning        Nutrition  Diabetic renal    DVT/VTE Prophylaxis  SCD/Lovenox    06/23/19 1137 -- 98.9   84 97 % -- 18 154/72    06/23/19 2308 -- 97.9   82 97  % -- 16 122/71      06/24/19 0800 -- 98.2  77 97 % -- 17 156/83      06/23/2019 12:29  Hemoglobin: 8.1   Hematocrit: 27.7   Platelet Count: 127     06/24/2019 05:03  Hemoglobin: 8.8   Hematocrit: 30.7   Platelet Count: 135       06/23/2019 12:29  Glucose: 374   BUN: 28.0   Creatinine: 5.6       06/24/2019 05:03  Glucose: 269   BUN: 30.0   Creatinine: 5.6       06/23/2019 12:29  Alkaline Phosphatase: 132   Albumin: 2.6   Protein Total: 6.6  Globulin: 4.0   Albumin/Globulin Ratio: 0.7   Bilirubin Total: 0.2      06/24/2019 05:03  Alkaline Phosphatase: 129   Albumin: 2.5   Protein Total: 6.5  Globulin: 4.0   Albumin/Globulin Ratio: 0.6   Bilirubin Total: 0.1       06/24/2019 05:03  HDL: 36     06/24/2019 05:03  Triglycerides: 271     06/24/2019 05:03  VLDL Calculated: 54     06/23/19 CT head:    IMPRESSION:    No intra-axial mass lesion or hemorrhage is demonstrated.    06/23/19 MRI:  IMPRESSION:   No evidence of acute infarction, intracranial hemorrhage, mass effect  or hydrocephalus.        06/23/19 Neuro Assessment:    64y/o woman with hx of DM, ESRD on HD presenting with 3 day history of numbness involving her right arm and leg. Exam notable for flattening of right NLF, decreased sensation RUE/LE and weakness in RUE. CT head imaging reviewed and no acute process noted.     Cannot r/o acute CVA.       Plan:    --ASA 325mg  x 1 dose in ED  --MRI brain  --further workup and plan pending MRI results    06/24/19 Neuro Assessment:    64y/o woman with hx of DM, ESRD on HD presenting with 3 day history of numbness involving her right arm and leg. Exam notable for flattening of right NLF, decreased sensation RUE/LE and weakness in RUE. CT head imaging reviewed and no acute process noted.     MRI brain negative for acute infarct. Cannot r/o out a tia but this would not explain her current main concern of RUE pain and swelling.       Plan:    --ASA 81mg  daily  --Lipid panel pending  --check CK  --RUE duplex ultrasound  pending  --Neurology will follow along as needed    Scheduled Meds:  Current Facility-Administered Medications   Medication Dose Route Frequency    amLODIPine  5 mg Oral Q12H    aspirin  81 mg Oral Daily    atorvastatin  40 mg Oral Daily    carvedilol  25 mg Oral Q12H    enoxaparin  40 mg Subcutaneous Daily    gabapentin  600 mg Oral TID    insulin glargine  10 Units Subcutaneous QAM    insulin lispro  1-4 Units Subcutaneous QHS    insulin lispro  1-8 Units Subcutaneous TID AC    lactobacillus/streptococcus  1 capsule Oral Daily    pantoprazole  40 mg Oral BID    sevelamer  800 mg Oral TID MEALS     aspirin tablet 325 mg   Dose: 325 mg  Freq: Once Route: PO  : 06/23/19 1334     morphine injection 0.5 mg   Dose: 0.5 mg  Freq: Once Route: IV   06/24/19 0415

## 2019-06-24 NOTE — Plan of Care (Signed)
Goals:  Pt will safely consume regular/thin liquids using safe swallow strategies with no overt s/s of aspiration or penetration across all PO intake.  Pt will safely consume least restrictive diet using safe swallow strategies with no overt s/s of aspiration or penetration across all PO intake.   Pt will participate in ongoing assessment/implementation of strategies with SLP for diet modifications as needed     Plan/Recommendations:           Diet Solids Recommendation: Continue with current diet, regular  Diet Liquids Recommendations: thin consistency  Precautions/Compensations: Awake/alert, Upright 90 degrees for all oral intake, 45 degrees upright after meals, Alternate solids and liquids, Small bites/sips, Eat/feed slowly  Recommendation Discussed With: : Patient, Nurse  SLP Frequency Recommended: follow-up visit only  Administration of Medications: PO  Aspiration Precautions posted at bedside: Reviewed with pt     06/24/2019  Quinn Plowman, M.S., [place] [phone]  Department #: [phone]

## 2019-06-24 NOTE — Plan of Care (Signed)
Pt is polite and cooperative. She is A/O x4. Vitals are stable. Pt is complaining of R side pain numbness and tingling. Her R arm is swollen with very weak grip. Pt received PRN oxy twice during this sift. She was still complaining of terrible pain in her arm making it hard for her to stay asleep and MD was notified and morphine was given and effective. Pt Has surgical wound to the L side of her abdomen to be changed daily. Wound consult was put in.             Problem: Safety  Goal: Patient will be free from injury during hospitalization  Outcome: Progressing     Problem: Pain  Goal: Pain at adequate level as identified by patient  Outcome: Progressing     Problem: Side Effects from Pain Analgesia  Goal: Patient will experience minimal side effects of analgesic therapy  Outcome: Progressing     Problem: Psychosocial and Spiritual Needs  Goal: Demonstrates ability to cope with hospitalization/illness  Outcome: Progressing     Problem: Every Day - Stroke  Goal: Stable vital signs and fluid balance  Outcome: Progressing  Goal: Patient's risk of aspiration will be minimized  Outcome: Progressing  Goal: Elimination patterns are normal or improving  Outcome: Progressing  Goal: Neurovascular status is stable or improving  Outcome: Progressing     Problem: Moderate/High Fall Risk Score >5  Goal: Patient will remain free of falls  Outcome: Progressing

## 2019-06-24 NOTE — Progress Notes (Signed)
Patient returned back to unit room 2528 from hemodialysis at 1500. Patient tolerated transfer and is stable.

## 2019-06-24 NOTE — Plan of Care (Signed)
Problem: Renal Instability  Goal: Fluid and electrolyte balance are achieved/maintained  Flowsheets (Taken 06/24/2019 1151)  Fluid and electrolyte balance are achieved/maintained:   Monitor/assess lab values and report abnormal values   Monitor daily weight   Assess for confusion/personality changes   Assess and reassess fluid and electrolyte status   Observe for seizure activity and initiate seizure precautions if indicated   Observe for cardiac arrhythmias  Goal: Free from infection  Outcome: Progressing  Flowsheets (Taken 06/24/2019 1150)  Free from infection: Monitor/assess for signs and symptoms of infection     Problem: Renal Instability  Goal: Free from infection  Outcome: Progressing  Flowsheets (Taken 06/24/2019 1150)  Free from infection: Monitor/assess for signs and symptoms of infection

## 2019-06-24 NOTE — Nursing Progress Note (Signed)
[name]'s right arm is swelling and painful  to touch. Patient has two rings and agreed to be cut off. ED staff is at bedside assisting her.

## 2019-06-24 NOTE — Plan of Care (Signed)
-  Patient alert and oriented x4. Patient received hemodialysis today and did well. Patient continues to report pain in RUE rating it a 6-7/10. Patient given PRN Oxycodone which provided some relief. Patient received an ultrasound of her RUE which showed a focal superficial vein thrombosis of the right cephalic vein, but no DVT. Dion Body, MD notified of results. Will continue to monitor and assess.     Problem: Safety  Goal: Patient will be free from injury during hospitalization  Outcome: Progressing  Flowsheets (Taken 06/24/2019 2004)  Patient will be free from injury during hospitalization:   Assess patient's risk for falls and implement fall prevention plan of care per policy   Provide and maintain safe environment   Use appropriate transfer methods   Ensure appropriate safety devices are available at the bedside   Include patient/ family/ care giver in decisions related to safety     Problem: Pain  Goal: Pain at adequate level as identified by patient  Outcome: Progressing  Flowsheets (Taken 06/24/2019 2004)  Pain at adequate level as identified by patient:   Identify patient comfort function goal   Assess for risk of opioid induced respiratory depression, including snoring/sleep apnea. Alert healthcare team of risk factors identified.   Assess pain on admission, during daily assessment and/or before any "as needed" intervention(s)     Problem: Renal Instability  Goal: Fluid and electrolyte balance are achieved/maintained  Outcome: Progressing  Flowsheets (Taken 06/24/2019 2004)  Fluid and electrolyte balance are achieved/maintained:   Monitor intake and output every shift   Monitor/assess lab values and report abnormal values   Provide adequate hydration   Monitor daily weight

## 2019-06-24 NOTE — Progress Notes (Signed)
[facility]  PROGRESS NOTE      Patient: [name]  Date: 06/24/2019   LOS: 0 Days  Admission Date: 06/23/2019   MRN: [id number]  Attending: Dolphus Jenny  Please contact me on the following epic chat       ASSESSMENT/PLAN     [name] is a 64 y.o. female admitted with Right sided numbness    Interval Summary:     Patient Active Hospital Problem List:    Right arm pain/edema  Acute superficial venous thrombosis s/p dialysis catheter  Though superficial, given how symptomatic- will start eliquis.   Did have recent GI bleed de to gastritis and was cleared for aspirin/pletal at that time  Has not had recurrent bleed, will monitor closely on eliquis  Will consult CVIR in AM    abd post op wound- nonhealing, purulent  Appreciate wound care orders  Will consult surgery tomorrow  Consider antibiotics    ESRD on HD  Recent peritonitis pseudomonas/Ecoli  S/p dialysis today  Removal of PD cath recently by Dr Baird Cancer    Recent upper GI bleed due to gastritis  Continue PPI BID, monitor H/H    HTN/hyperlipidemia  Amlodipine, atorvastatin    COPD    DM2 with hyperglycemia  bs 140-250  Continue insulin          DVT Prophylaxis:eliquis       Code Status: full    DISPO: pending improvement     Family Contact: none       SUBJECTIVE     [name] states right arm swollen and painful. No sob no recent fall no chest pain nausea/vomiting cough    MEDICATIONS     Current Facility-Administered Medications   Medication Dose Route Frequency    amLODIPine  5 mg Oral Q12H    aspirin  81 mg Oral Daily    atorvastatin  40 mg Oral Daily    carvedilol  25 mg Oral Q12H    enoxaparin  40 mg Subcutaneous Daily    gabapentin  600 mg Oral TID    insulin glargine  10 Units Subcutaneous QAM    insulin lispro  1-4 Units Subcutaneous QHS    insulin lispro  1-8 Units Subcutaneous TID AC    lactobacillus/streptococcus  1 capsule Oral Daily    pantoprazole  40 mg Oral BID    sevelamer  800 mg Oral TID MEALS        PHYSICAL EXAM     Vitals:    06/24/19 1600   BP:    Pulse: 76   Resp:    Temp:    SpO2:        Temperature: Temp  Min: 97.5 F (36.4 C)  Max: 98.9 F (37.2 C)  Pulse: Pulse  Min: 67  Max: 82  Respiratory: Resp  Min: 14  Max: 19  Non-Invasive BP: BP  Min: 118/56  Max: 159/70  Pulse Oximetry SpO2  Min: 95 %  Max: 98 %    Intake and Output Summary (Last 24 hours) at Date Time    Intake/Output Summary (Last 24 hours) at 06/24/2019 1733  Last data filed at 06/24/2019 1558  Gross per 24 hour   Intake 120 ml   Output 2000 ml   Net -1880 ml         GEN APPEARANCE: Normal;  A&OX3  HEENT: PERLA; EOMI; Conjunctiva Clear  NECK: Supple; No bruits  CVS: RRR, S1, S2; No M/G/R  LUNGS:  CTAB; No Wheezes; No Rhonchi: No rales  ABD: Soft; No TTP; + Normoactive BS  EXT: RUE tender, edematous  Skin exam:  no pallor  NEURO: CN 2-12 intact; No Focal neurological deficits  CAP REFILL:  Normal  MENTAL STATUS:  Normal    Exam done by Dolphus Jenny, MD on 06/24/19 at 5:33 PM      LABS     Recent Labs   Lab 06/24/19  0503 06/23/19  1229   WBC 6.15 7.26   RBC 3.13* 2.88*   Hgb 8.8* 8.1*   Hematocrit 30.7* 27.7*   MCV 98.1* 96.2*   Platelets 135* 127*       Recent Labs   Lab 06/24/19  0503 06/23/19  1229   Sodium 139 138   Potassium 4.6 4.2   Chloride 104 101   CO2 26 25   BUN 30.0* 28.0*   Creatinine 5.6* 5.6*   Glucose 269* 374*   Calcium 7.8* 7.8*   Magnesium 1.9  --        Recent Labs   Lab 06/24/19  0503 06/23/19  1229   ALT 10 8   AST (SGOT) 11 7   Bilirubin, Total 0.1* 0.2   Albumin 2.5* 2.6*   Alkaline Phosphatase 129* 132*       Recent Labs   Lab 06/24/19  1100 06/23/19  1229   Creatine Kinase (CK) 25*  --    Troponin I  --  0.02       Recent Labs   Lab 06/23/19  1229   PT INR 1.0   PT 11.9   PTT 30       Microbiology Results     Procedure Component Value Units Date/Time    COVID-19 (SARS-COV-2) [facility]) [[id number]] Collected: 06/23/19 1347    Specimen: Nasopharyngeal Swab from Nasopharynx Updated: 06/23/19 1414     Purpose  of COVID testing Screening     SARS-CoV-2 Specimen Source Nasopharyngeal     SARS CoV 2 Overall Result Negative     Comment: Test performed using the Abbott ID NOW EUA assay.  Please see Fact Sheets for patients and providers located at:  http://olson-hall.info/  This test is for the qualitative detection of SARS-CoV-2  (COVID19) nucleic acid. Viral nucleic acids may persist in vivo,  independent of viability. Detection of viral nucleic acid does  not imply the presence of infectious virus, or that virus  nucleic acid is the cause of clinical symptoms. Negative  results should be treated as presumptive and, if inconsistent  with clinical signs and symptoms or necessary for patient  management, should be tested with an alternative molecular  assay. Negative results do not preclude SARS-CoV-2 infection  and should not be used as the sole basis for patient  management decisions. Invalid results may be due to inhibiting  substances in the specimen and recollection should occur.         Narrative:      o Collect and clearly label specimen type:  o Upper respiratory specimen: One Nasopharyngeal Dry Swab NO  Transport Media.  o Hand deliver to laboratory ASAP  Indication for testing->Extended care facility admission to  semi private room           RADIOLOGY     Upon my review:    Rowan Blase  5:33 PM 06/24/2019

## 2019-06-24 NOTE — Consults (Signed)
Wound Ostomy Continence Consultation    Date Time: 06/24/19 7:10 PM  Patient Name: [name], [name]  Consulting Service: WOCN  Reason for Consult: abdominal wound    Assessment   Assessment   Specialty Bed:   Centrella mattress  Braden Score: Braden Scale Score: 16 (06/24/19 1600)  BMI: Body mass index is 44.92 kg/m.  DIET: Diet consistent carbohydrate and renal Protein restriction: 80 GM Protein  Supervise For Meals Frequency: All meals    Patient assessed in her room after coming back from dialysis. She was recently discharged but returned to the hospital.     During her last admission, her infected HD catheter was removed. The initial wound was treated with a wound vac, but the patient was unable to tolerate the discomfort. That dressing was removed and new orders for packing with antimicrobial Maxorb AG was placed.     Patient has a surgical incision on their abdomen. It is Present On Admission. It measures  8x5x3.5 cm.  The wound bed is pink/red tissue covered by a significant amount (80%) necrotic yellow/brown slough.  There is no tunneling or undermining. There is moderate seropurulent drainage, . The periwound is has some dark discoloration and some friable edges and bleeding at the wound edges.       Media Information     Document Information    Photographs   Abd 8x5x3.5   06/24/2019 17:34   Attached To:   Hospital Encounter on 06/23/19   Source Information    Hanley Hays, RN   [facility]         Plan   Plan  Recommend follow up with surgery.  Wound care to abdominal wound:   1. Clean with or Dakin's Solution   2. Moisten gauze with Dakins solutions (1/4 strength), squeeze out of excess fluid, open the gauze, fluff and tuck into wound.   3. Pack lightly.   4. Protect periwound skin with Cavilon no sting barrier film or spray.   5.Cover with secondary dressing such as sacral border foam dressings.   6. Change dressing change every 12 hours      Dakin's solution is used to reduce bacterial  burden and promote healing.          History of Presenting Illness   HPI:   [name] is a 64 y.o. female admitted with Right sided numbness.  From H&P By Dr Shelly Rubenstein:    [name] is a 64 y.o. female with a PMHx of hypertension, hyperlipidemia, insulin-dependent diabetes mellitus, COPD, ESRD currently on hemodialysis Tuesday Thursday Saturday, peritoneal dialysis associated Pseudomonas/E. coli peritonitis, upper GI bleed secondary to gastritis on 05/13/2019 and anemia of chronic kidney disease who presented with right sided numbness and weakness.  Patient with recent hospitalization from 05/27/2019 to 06/13/2019 due to PD associated peritonitis with Pseudomonas and E. coli s/p starting of hemodialysis on 05/26/2019 and exploratory laparotomy, lysis of adhesion and removal of PD catheter on 05/27/2019.  Patient's last hemodialysis done on 06/21/2019 without any complications.    Patient reported right face, right upper extremity and right lower extremity numbness x3 days associated with weakness of right upper extremity and right hand pain.    Patient denied any fever chills nausea vomiting diarrhea chest pain shortness of breath headache lightheadedness or dizziness    During patient's initial evaluation in the emergency department, patient was found to have fairly stable vitals.  Unchanged laboratory data except for hyperglycemia with glucose of 374.  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    Past Medical and Surgical History     Past Medical History:   Diagnosis Date    Chronic obstructive pulmonary disease     Diabetes mellitus     Hyperlipidemia     Hypertension     Sleep apnea 2018       Past Surgical History:   Procedure Laterality Date    EGD, BIOPSY N/A 05/14/2019    Procedure: EGD, BIOPSY;  Surgeon: Ronie Spies, MD;  Location: [facility];  Service: Gastroenterology;  Laterality: N/A;    EXPLORATORY  LAPAROTOMY N/A 05/27/2019    Procedure: EXPLORATORY LAPAROTOMY;  Surgeon: Herbert Moors., MD;  Location: [facility];  Service: General;  Laterality: N/A;    JOINT REPLACEMENT      shoulder    JOINT REPLACEMENT      knee    OVARY SURGERY Left     Removal    REMOVAL, FOREIGN BODY N/A 05/27/2019    Procedure: REMOVAL, PERITONEAL DIALYSIS CATHETER;  Surgeon: Herbert Moors., MD;  Location: [facility];  Service: General;  Laterality: N/A;    TUNNELED CATH PLACEMENT ([place]) Bilateral 05/23/2019    Procedure: TUNNELED CATH PLACEMENT;  Surgeon: Jacques Earthly, MD;  Location: [facility];  Service: Interventional Radiology;  Laterality: Bilateral;       POD: * No surgery found * Length of Stay:  LOS: 0 days       Lab   Significant Lab Values:  Lab Results   Component Value Date    WBC 6.15 06/24/2019    HGB 8.8 (L) 06/24/2019    HCT 30.7 (L) 06/24/2019    PLT 135 (L) 06/24/2019    CHOL 182 06/24/2019    TRIG 271 (H) 06/24/2019    HDL 36 (L) 06/24/2019    LDL 92 06/24/2019    ALT 10 06/24/2019    AST 11 06/24/2019    NA 135 (L) 06/24/2019    K 3.6 06/24/2019    CL 98 (L) 06/24/2019    CREAT 3.0 (H) 06/24/2019    BUN 14.0 06/24/2019    CO2 26 06/24/2019    INR 1.0 06/23/2019    GLU 322 (H) 06/24/2019    HGBA1C 9.8 (H) 05/13/2019     Lab Results   Component Value Date    PROT 6.5 06/24/2019    ALB 2.5 (L) 06/24/2019     Lab Results   Component Value Date    IRON 11 (L) 05/13/2019    TIBC 141 (L) 05/13/2019    FERRITIN 1,070.90 (H) 05/13/2019       Thank you for this consultation,    Hanley Hays, BSN, RN, CWOCN  Wound, Ostomy, and Continence Nurse Coordinator  [facility]  [address] [place], [place] [address]  T [phone] Jerilynn Mages [phone]  [email or web address]

## 2019-06-25 ENCOUNTER — Observation Stay: Payer: 59

## 2019-06-25 DIAGNOSIS — S31109D Unspecified open wound of abdominal wall, unspecified quadrant without penetration into peritoneal cavity, subsequent encounter: Secondary | ICD-10-CM

## 2019-06-25 LAB — CBC
Absolute NRBC: 0 10*3/uL (ref 0.00–0.00)
Hematocrit: 29.5 % — ABNORMAL LOW (ref 34.7–43.7)
Hgb: 8.6 g/dL — ABNORMAL LOW (ref 11.4–14.8)
MCH: 28 pg (ref 25.1–33.5)
MCHC: 29.2 g/dL — ABNORMAL LOW (ref 31.5–35.8)
MCV: 96.1 fL — ABNORMAL HIGH (ref 78.0–96.0)
MPV: 11.9 fL (ref 8.9–12.5)
Nucleated RBC: 0 /100 WBC (ref 0.0–0.0)
Platelets: 141 10*3/uL — ABNORMAL LOW (ref 142–346)
RBC: 3.07 10*6/uL — ABNORMAL LOW (ref 3.90–5.10)
RDW: 14 % (ref 11–15)
WBC: 6.7 10*3/uL (ref 3.10–9.50)

## 2019-06-25 LAB — BASIC METABOLIC PANEL
Anion Gap: 12 (ref 5.0–15.0)
BUN: 20 mg/dL — ABNORMAL HIGH (ref 7.0–19.0)
CO2: 26 mEq/L (ref 22–29)
Calcium: 8.2 mg/dL — ABNORMAL LOW (ref 8.5–10.5)
Chloride: 100 mEq/L (ref 100–111)
Creatinine: 3.6 mg/dL — ABNORMAL HIGH (ref 0.6–1.0)
Glucose: 174 mg/dL — ABNORMAL HIGH (ref 70–100)
Potassium: 3.9 mEq/L (ref 3.5–5.1)
Sodium: 138 mEq/L (ref 136–145)

## 2019-06-25 LAB — GLUCOSE WHOLE BLOOD - POCT
Whole Blood Glucose POCT: 175 mg/dL — ABNORMAL HIGH (ref 70–100)
Whole Blood Glucose POCT: 274 mg/dL — ABNORMAL HIGH (ref 70–100)
Whole Blood Glucose POCT: 298 mg/dL — ABNORMAL HIGH (ref 70–100)
Whole Blood Glucose POCT: 210 mg/dL — ABNORMAL HIGH (ref 70–100)

## 2019-06-25 LAB — HEMOLYSIS INDEX: Hemolysis Index: 0 (ref 0–18)

## 2019-06-25 LAB — GFR: EGFR: 15.4

## 2019-06-25 MED ORDER — DAKINS (1/4 STRENGTH) 0.125 % EX SOLN
Freq: Every day | CUTANEOUS | Status: DC
Start: 2019-06-25 — End: 2019-06-29
  Filled 2019-06-25: qty 473

## 2019-06-25 MED ORDER — ZINC SULFATE 220 (50 ZN) MG PO CAPS
220.00 mg | ORAL_CAPSULE | Freq: Every day | ORAL | Status: DC
Start: 2019-06-25 — End: 2019-06-29
  Administered 2019-06-25 – 2019-06-29 (×5): 220 mg via ORAL
  Filled 2019-06-25 (×5): qty 1

## 2019-06-25 MED ORDER — COLLAGENASE 250 UNIT/GM EX OINT
TOPICAL_OINTMENT | CUTANEOUS | Status: DC
Start: 2019-06-25 — End: 2019-06-29
  Filled 2019-06-25: qty 30

## 2019-06-25 MED ORDER — INSULIN GLARGINE 100 UNIT/ML SC SOLN
10.00 [IU] | Freq: Two times a day (BID) | SUBCUTANEOUS | Status: DC
Start: 2019-06-25 — End: 2019-06-26
  Administered 2019-06-25 – 2019-06-26 (×2): 10 [IU] via SUBCUTANEOUS
  Filled 2019-06-25 (×2): qty 10

## 2019-06-25 MED ORDER — ASCORBIC ACID 500 MG PO TABS
500.0000 mg | ORAL_TABLET | Freq: Every day | ORAL | Status: DC
Start: 2019-06-25 — End: 2019-06-29
  Administered 2019-06-25 – 2019-06-29 (×5): 500 mg via ORAL
  Filled 2019-06-25 (×5): qty 1

## 2019-06-25 NOTE — Plan of Care (Signed)
Pt is polite and cooperative. She is A/Ox4. Pt vitals have been stable. She is having pain, numbness, and tingling in her R arm. Pt denies the tingling in the R leg anymore. pt R arm still swollen and rings had to be cut off her fingers. Pt receiving PRN pain medications X2 during this shift. Pt daughter was called and updated regarding pt's condition and requested an update during dayshift as well. Pt wound was cleaned by the wound care nurse today and pt did not want her wound dressing changed again due to it causing her increased pain. Pt is able to ambulate to the bathroom with standby assist. She has remained free from falls. Will continue to monitor.             Problem: Safety  Goal: Patient will be free from injury during hospitalization  Outcome: Progressing     Problem: Pain  Goal: Pain at adequate level as identified by patient  Outcome: Not Progressing     Problem: Side Effects from Pain Analgesia  Goal: Patient will experience minimal side effects of analgesic therapy  Outcome: Progressing     Problem: Psychosocial and Spiritual Needs  Goal: Demonstrates ability to cope with hospitalization/illness  Outcome: Progressing     Problem: Every Day - Stroke  Goal: Core/Quality measure requirements - Daily  Outcome: Progressing  Goal: Neurological status is stable or improving  Outcome: Progressing  Goal: Elimination patterns are normal or improving  Outcome: Progressing  Goal: Effective coping demonstrated  Outcome: Progressing     Problem: Moderate/High Fall Risk Score >5  Goal: Patient will remain free of falls  Outcome: Progressing     Problem: Renal Instability  Goal: Fluid and electrolyte balance are achieved/maintained  Outcome: Progressing

## 2019-06-25 NOTE — Progress Notes (Signed)
[facility] PROGRESS NOTE  Aaron Edelman, x [phone] [address] [facility] Spectralink)      Date Time: 06/25/19 4:39 PM  Patient Name: [name]  Attending Physician: Dolphus Jenny, MD    CC: follow-up CKD, CHF    Assessment:     1. CKD- on HD TTS  2. Anemia- CKD and chronic disease; Hb 8.6. Low Fe in the past.  3. HTN  4.RUE pain/swelling- superficial vein thrombosis RUE.   5. Nonhealing surgical wound from infected PD catheter removal   6. Type 2 DM-uncontrolled  7. Hypocalcemia- low albumin; check Phos  8. Obesity      Recommendations:     1. HD in am as per OP routine  2. Check Fe studies  3. Check ipth, phos  4. Daily renal panel, cbc  5. Continue current BP meds  6. Start ESA if Fe replete      Case discussed with: pt, HD RN    Ulyses Amor, MD  [facility]  [phone] (office)  X [phone] [facility])      Subjective: C/o pain in RUE     Review of Systems:   Review of Systems - No SOB, CP    Physical Exam:     Vitals:    06/25/19 0808 06/25/19 0825 06/25/19 1213 06/25/19 1602   BP: 144/84 138/76 123/75 119/63   Pulse: 66  71 64   Resp: 16  18 17    Temp: 97.5 F (36.4 C)  98.6 F (37 C) 98.2 F (36.8 C)   TempSrc: Oral  Oral Oral   SpO2: 96%  95% 97%   Weight:       Height:           Intake and Output Summary (Last 24 hours) at Date Time    Intake/Output Summary (Last 24 hours) at 06/25/2019 1639  Last data filed at 06/24/2019 1900  Gross per 24 hour   Intake 120 ml   Output --   Net 120 ml       General: awake, alert, oriented x 3, no acute distress.  Cardiovascular: regular rate and rhythm, no murmurs, rubs or gallops  Lungs: clear to auscultation bilaterally, without wheezing, rhonchi, or rales  Abdomen: RLQ dressing, soft, non-tender, non-distended, normoactive bowel sounds  Extremities: + RUE edema  Other: RIJ Permcath      Meds:      Scheduled Meds: PRN Meds:    amLODIPine, 5 mg, Oral, Q12H  apixaban, 5 mg, Oral, Q12H SCH  atorvastatin, 40 mg, Oral, Daily  carvedilol, 25 mg,  Oral, Q12H  collagenase, , Topical, Q24H  gabapentin, 600 mg, Oral, TID  insulin glargine, 10 Units, Subcutaneous, Q12H  insulin lispro, 1-4 Units, Subcutaneous, QHS  insulin lispro, 1-8 Units, Subcutaneous, TID AC  lactobacillus/streptococcus, 1 capsule, Oral, Daily  pantoprazole, 40 mg, Oral, BID  sevelamer, 800 mg, Oral, TID MEALS  sodium hypochlorite, , Irrigation, Daily  vitamin C, 500 mg, Oral, Daily  zinc sulfate, 220 mg, Oral, Daily          Continuous Infusions:   acetaminophen, 650 mg, Q6H PRN   Or  acetaminophen, 650 mg, Q6H PRN  albuterol-ipratropium, 3 mL, Q6H PRN  dextrose, 15 g of glucose, PRN   And  dextrose, 12.5 g, PRN  dextrose, 15 g of glucose, PRN   And  dextrose, 12.5 g, PRN   And  glucagon (rDNA), 1 mg, PRN  hydrALAZINE, 25 mg, Q6H PRN  melatonin, 3 mg, QHS  PRN  naloxone, 0.2 mg, PRN  ondansetron, 4 mg, Q6H PRN   Or  ondansetron, 4 mg, Q6H PRN  oxyCODONE, 5 mg, Q6H PRN              Labs:     Recent Labs   Lab 06/25/19  0300 06/24/19  0503 06/23/19  1229   WBC 6.70 6.15 7.26   Hgb 8.6* 8.8* 8.1*   Hematocrit 29.5* 30.7* 27.7*   Platelets 141* 135* 127*     Recent Labs   Lab 06/25/19  0300 06/24/19  1811 06/24/19  0503 06/23/19  1229 06/23/19  1229   Sodium 138 135* 139  More results in Results Review 138   Potassium 3.9 3.6 4.6  More results in Results Review 4.2   Chloride 100 98* 104  More results in Results Review 101   CO2 26 26 26   More results in Results Review 25   BUN 20.0* 14.0 30.0*  More results in Results Review 28.0*   Creatinine 3.6* 3.0* 5.6*  More results in Results Review 5.6*   Calcium 8.2* 7.8* 7.8*  More results in Results Review 7.8*   Albumin  --   --  2.5*  --  2.6*   Magnesium  --   --  1.9  --   --    Glucose 174* 322* 269*  More results in Results Review 374*   EGFR 15.4 19.0 9.2  More results in Results Review 9.2   More results in Results Review = values in this interval not displayed.             Invalid input(s): LEUKOCYTESUR        Imaging personally reviewed,  including: No results found.        Signed by: Ulyses Amor, MD

## 2019-06-25 NOTE — Progress Notes (Signed)
Established HD patient at [facility] [place] MWF 1pm.  Copy of H&P faxed to chronic unit for files.  Writer will coordinate ROC when stable for d/c.     Freida Busman, HD Care Coordinator  [phone]

## 2019-06-25 NOTE — Progress Notes (Signed)
[facility]  CVIR   Consult Note     64 y/o female with RUE superficial right cephalic vein thrombosis seen on RUE venous doppler on 06/24/19. Thrombosis is in a superficial vein, very focal and short segment located at a previous IV site. CVIR contacted to evaluate for possible intervention out of concern for patient symptomology associated with findings of thrombosis on imaging.    PLAN:    No intervention needed from CVIR standpoint.   This thrombosis may not even require anticoagulation.   Continue medical management per primary medicine team.    Donovan Kail, NP  [facility]  [phone]      I have evaluated the patient and agree with the above documented evaluation and plan.    Signed by: Berline Lopes, [place] Department  [phone]

## 2019-06-25 NOTE — OT Eval Note (Addendum)
[facility]  Occupational Therapy Evaluation and Treatment    Patient: [name]  MRN#: [id number]   Unit: [place] [facility]  Bed: A2528/A2528-01    Time of Evaluation and Treatment:  Time Calculation  OT Received On: 06/25/19  Start Time: 0734  Stop Time: 0806  Time Calculation (min): 32 min    Chart Review and Collaboration with Care Team: 6 minutes, not included in above time.    Evaluation: 9 minutes  Treatment:  23 minutes    OT Visit Number: 1    Consult received for [name] for OT Evaluation and Treatment.  Patients medical condition is appropriate for Occupational therapy intervention at this time.    Activity Orders:  OT eval and treat    Precautions and Contraindications:  Precautions  Weight Bearing Status: no restrictions  Other Precautions: Falls    Personal Protective Equipment (PPE)  gloves, procedure mask and eye shield/covering    Medical Diagnosis:  ESRD (end stage renal disease) on dialysis [N18.6, Z99.2]  Cerebrovascular accident (CVA), unspecified mechanism [I63.9]  Right sided numbness [R20.0]    History of Present Illness:  [name] is a 64 y.o. female admitted on 06/23/2019 with with 3 day history of numbness involving her right arm and leg.    Patient Active Problem List   Diagnosis    Iron deficiency anemia secondary to inadequate dietary iron intake    Sideropenic dysphagia    Peritoneal dialysis catheter dysfunction, initial encounter    Peritonitis    Upper GI bleed    ESRD (end stage renal disease) on dialysis    Generalized abdominal pain    Thrombocytopenia    Hypotension    Type 2 diabetes mellitus, with long-term current use of insulin    GERD (gastroesophageal reflux disease)    Bowel incontinence    Right sided numbness    Uncontrolled type 2 diabetes mellitus with hyperglycemia    Hypertension    Hyperlipidemia    History of COPD    Gastritis with hemorrhage    History of anemia due to chronic kidney disease         Past Medical/Surgical History:  Past Medical History:   Diagnosis Date    Chronic obstructive pulmonary disease     Diabetes mellitus     Hyperlipidemia     Hypertension     Sleep apnea 2018     Past Surgical History:   Procedure Laterality Date    EGD, BIOPSY N/A 05/14/2019    Procedure: EGD, BIOPSY;  Surgeon: Ronie Spies, MD;  Location: [facility];  Service: Gastroenterology;  Laterality: N/A;    EXPLORATORY LAPAROTOMY N/A 05/27/2019    Procedure: EXPLORATORY LAPAROTOMY;  Surgeon: Herbert Moors., MD;  Location: [facility];  Service: General;  Laterality: N/A;    JOINT REPLACEMENT      shoulder    JOINT REPLACEMENT      knee    OVARY SURGERY Left     Removal    REMOVAL, FOREIGN BODY N/A 05/27/2019    Procedure: REMOVAL, PERITONEAL DIALYSIS CATHETER;  Surgeon: Herbert Moors., MD;  Location: [facility];  Service: General;  Laterality: N/A;    TUNNELED CATH PLACEMENT ([place]) Bilateral 05/23/2019    Procedure: TUNNELED CATH PLACEMENT;  Surgeon: Jacques Earthly, MD;  Location: [facility];  Service: Interventional Radiology;  Laterality: Bilateral;        X-Rays/Tests/Labs  Lab Results   Component Value  Date/Time    HGB 8.6 (L) 06/25/2019 03:00 AM    HCT 29.5 (L) 06/25/2019 03:00 AM    K 3.9 06/25/2019 03:00 AM    NA 138 06/25/2019 03:00 AM    INR 1.0 06/23/2019 12:29 PM    TROPI 0.02 06/23/2019 12:29 PM    TROPI 0.04 05/17/2019 06:20 PM    TROPI 0.05 05/17/2019 01:37 PM    TROPI 0.01 03/18/2006 06:10 PM       All imaging reviewed. Please see chart for details      Social History:  Prior Level of Function  Prior level of function: Independent with ADLs, Ambulates with assistive device  Assistive Device: Front wheel walker, Single point cane  Baseline Activity Level: Household ambulation  Driving: does not drive  Dressing - Upper Body: independent  Dressing - Lower Body: independent  Cooking: Yes  Feeding: independent  Bathing: modified independent  Grooming:  independent  Toileting: independent  Employment: Disabled  DME Currently at Home: ADL- Civil engineer, contracting, Environmental consultant, [employer], [place]  Living Arrangements: Alone  Type of Home: [place]: (2nd floor; Stairs only to enter no Media planner )  Bathroom Shower/Tub: Administrator, Civil Service: Soil scientist: Public house manager: Accessible  DME Currently at Home: ADL- Civil engineer, contracting, Environmental consultant, [employer], [place], [place] - Notes / Comments: Pt. reports no assist post d/c; Pt. questionable historian, prior lvl information from PT evaluation differs , patient seemed unclear of apartment lvl during session      Subjective:  Subjective: "They had to cut my rings off last night" patient repeated 2-3 x throughout session "I think I have to go to the bathroom, but it's not coming" patient repeated >8x while in bathroom   Patient is agreeable to participation in the therapy session.   Patient Goal: Reduce pain in arm   Pain Assessment  Pain Assessment: Numeric Scale (0-10)  Pain Score: 8-severe pain  Pain Location: Arm  Pain Orientation: Right  Pain Descriptors: Aching, Pins and needles  Pain Frequency: Continuous, Increases with movement(Specifically wrist movement (Supination/Flexion))  Effect of Pain on Daily Activities: severe  Pain Intervention(s): Medication (See eMAR)(Notified RN)  Multiple Pain Sites: No      Objective:   Inspection/Posture  Inspection/Posture: Pt. w/ R arm elevated; RN reports pt. complaints of pain in R UE  Observation of Patient/Vital Signs:BP 144/84    Pulse 66    Temp 97.5 F (36.4 C) (Oral)    Resp 16    Ht 1.575 m (5\' 2" )    Wt 111.4 kg (245 lb 9.5 oz)    LMP  (LMP Unknown) Comment: post menopausal   SpO2 96%    BMI 44.92 kg/m       Cognitive Status and Neuro Exam:  Cognition/Neuro Status  Arousal/Alertness: Appropriate responses to stimuli  Attention Span: Attends to task with redirection  Orientation  Level: Oriented to place, Oriented to situation, Oriented to person(Is it 8 at night or 8 in the morning? able to state date)  Memory: Decreased short term memory, Decreased recall of biographical information  Following Commands: moderate verbal instruction  Safety Awareness: moderate verbal instruction  Insights: Decreased awareness of deficits, Educated in safety awareness  Problem Solving: moderate assistance  Comments: Pt. unsafe w/ out of bed mobility, unaware of deficits   Behavior: cooperative  Motor Planning: intact  Coordination: [place] impaired, [place] impaired  Hand Dominance:  right handed    Musculoskeletal Examination  Gross ROM  Right Upper Extremity ROM: needs focused assessment  RUE ROM - Shoulder: reduced by 50%  RUE ROM - Elbow: reduced by 50%  RUE ROM - Hand: reduced by 75%(Moves digits minimally; Thumb w/ near full AROM)  Left Upper Extremity ROM: needs focused assessment  LUE ROM - Shoulder - % Reduced: reduced by 50%  LUE ROM - Elbow - % Reduced: within functional limits  LUE ROM - Hand - % Reduced: within functional limits  Gross Strength  Right Upper Extremity Strength: unable to assess(pt. w/ min movement; Unable to grip hand )  Left Upper Extremity Strength: within functional limits       Sensory/Oculomotor Examination  Sensory  Auditory: intact  Tactile - Light Touch: impaired right(R UE only )         Activities of Daily Living  Self-care and Home Management  Grooming: Minimal Assist, standing at sink, steadying, setup, increased time to complete, standing with assistive device, teeth care(Neuro Re-ed WB, use of R UE for ADL)  Functional Transfers: Moderate Assist, steadying, supervision/safety, increased time to complete, toilet transfer  Additional Comments: Neuro re-ed WB, use of R UE throughout session; Pt. frequently yelling w/ pain throughout session    Functional Mobility:  Mobility and Transfers  Scooting to HOB: Contact Guard Assist  Scooting to EOB: Contact Guard Assist  Supine to  Sit: Contact Guard Assist  Sit to Supine: Contact Guard Assist  Sit to Stand: Minimal Assist  Functional Mobility/Ambulation: Minimal Assist  Functional Mobility Deferred (Comment): Pt. frequently quickly extending spine w/ functional ambulation     Balance  Balance  Static Sitting Balance: Contact Guard Assist  Dyanamic Sitting Balance: Minimal Assist  Static Standing Balance: Minimal Assist  Dynamic Standing Balance: Minimal Assist    Participation and Activity Tolerance  Participation and Endurance  Participation Effort: good  Endurance: Tolerates 30 min exercise with multiple rests    Educated the patient to role of occupational therapy, plan of care, goals of therapy and safety with mobility and ADLs with verbalized understanding.    Patient left in bed with alarm and all other medical equipment in place and call bell and all personal items/needs within reach.  RN notified of session outcome.      Assessment:  [name] is a 64 y.o. female admitted 06/23/2019. Patient would benefit from continued skilled OT to address deficits listed below and increase functional independence.     OT Assessment: decreased ROM;decreased strength;balance deficits;decreased independence with ADLs;decreased attention;decreased safety awareness;decreased cognition;decreased independence with IADLs;decreased endurance/activity tolerance      Treatment:  -Pt. Received lying supine in bed, agreeable to therapy session. RN clears for OT.   - Evaluation of patient RUE, patient frequently yelling in pain reporting 8/10 pain; Specifically w/ supination and flexion of the wrist. Pt. W/ mod edema in R UE. Sensation present. Possible extensor tone.   - Pt. CGA for bed mobility   - Pt. Min A w/ FWW sit<>stand from bed. Gait belt used throughout session to ensure patient and therapist safety.   - Pt. Min A w/ FWW for functional ambulation to bathroom. Patient frequently quickly extending spine during functional ambulation w/ LOB.  Difficulty propelling legs forward.   - Pt. Mod A for toilet transfer. Pt. trequiring extensive time to attempt to use bathroom stating >8x during "I feel like I need to go, it's not coming"   - Pt. CGA for standing at sink,  Min A for oral hygiene, requiring assist to unscrew lid on toothpaste. Neuro re-ed via WB at counter and having R UE assist w/ ADL.   - Pt. Returned to bed, CGA for bed mobility. Elevated R UE 2/2 mod edema.   - Recommending SNF. Patient unsafe to return home alone w/out assist. Only assist patient has is 1-2 days week RN comes to home for wound change.       Rehabilitation Potential: Prognosis: Good    Plan:  OT Frequency Recommended: 2-3x/wk   Treatment Interventions: ADL retraining, Functional transfer training, UE strengthening/ROM, Endurance training, Patient/Family training, Equipment eval/education, Fine motor coordination activities, Compensatory technique education, Neuro muscular reeducation     PMP Activity: Step 6 - Walks in Room  Distance Walked (ft) (Step 6,7): 15 Feet    Risks/benefits/POC discussed    Goals:  Time For Goal Achievement: by time of discharge  ADL Goals  Patient will groom self: Supervision, at sinkside, by time of discharge, Not met  Patient will dress upper body: Minimal Assist, by time of discharge, Not met  Patient will dress lower body: Minimal Assist, by time of discharge, Not met  Patient will toilet: Supervision, by time of discharge, Not met  Mobility and Transfer Goals  Pt will perform functional transfers: Supervision, with rolling walker, by time of discharge, Not met                             DME Recommended for Discharge: [facility]  Discharge Recommendation: SNF      Nell Range, [place], OTR/L  06/25/2019 8:27 AM

## 2019-06-25 NOTE — UM Notes (Signed)
[facility] Utilization Review  NPI: [id number], Tax ID: [id number]  Please Call Krystal Eaton, MSN, RN @ [phone] with any questions or concerns. Confidential voicemail.  Email: [email or web address]   Fax final authorizations and requests for additional information to [phone]    06/25/19 Continued Stay Review     [name] is a 64 y.o. female admitted with Right sided numbness    Interval Summary:     Patient Active Hospital Problem List:    Right arm pain/edema  Acute superficial venous thrombosis s/p dialysis catheter  Though superficial, started eliquis due to significant pain and swelling  Did have recent GI bleed due to gastritis and was cleared for aspirin/pletal at that time  Has not had recurrent bleed, will monitor closely on eliquis  Discussed with CVIR, states that clot due to peripheral IV not to tunneled cath and no further intervention from their standpoint.  Obtain xrays, rule out underlying fracture given the extent of swelling.    abd post op wound- nonhealing, purulent  Appreciate wound care orders  Discussed with surgery, consulted-- recommended local wound care    ESRD on HD  Recent peritonitis pseudomonas/Ecoli  S/p dialysis   Removal of PD cath recently by Dr Baird Cancer    Recent upper GI bleed due to gastritis  Continue PPI BID, monitor H/H    HTN/hyperlipidemia  Amlodipine, atorvastatin    COPD    DM2 with hyperglycemia  bs 140-250  Continue insulin    DVT Prophylaxis:eliquis    SUBJECTIVE     [name] states right arm still swollen and painful     Temp:  [97.5 F (36.4 C)-99.7 F (37.6 C)] 98.6 F (37 C)  Heart Rate:  [66-81] 71  Resp Rate:  [16-19] 18  BP: (123-164)/(74-94) 123/75     Lab Results last 48 Hours     Procedure Component Value Units Date/Time    Glucose Whole Blood - POCT [[id number]]  (Abnormal) Collected: 06/25/19 1211     Updated: 06/25/19 1215     Whole Blood Glucose POCT 274 mg/dL     Glucose Whole Blood - POCT [[id number]]   (Abnormal) Collected: 06/25/19 0808     Updated: 06/25/19 0813     Whole Blood Glucose POCT 175 mg/dL     Basic Metabolic Panel [[id number]]  (Abnormal) Collected: 06/25/19 0300    Specimen: Blood Updated: 06/25/19 0424     Glucose 174 mg/dL      BUN 20.0 mg/dL      Creatinine 3.6 mg/dL      Calcium 8.2 mg/dL     Hemolysis index [[id number]] Collected: 06/25/19 0300     Updated: 06/25/19 0424     Hemolysis Index 0    GFR [[id number]] Collected: 06/25/19 0300     Updated: 06/25/19 0424     EGFR 15.4    CBC without differential [[id number]]  (Abnormal) Collected: 06/25/19 0300    Specimen: Blood Updated: 06/25/19 0406     WBC 6.70 x10 3/uL      Hgb 8.6 g/dL      Hematocrit 29.5 %      Platelets 141 x10 3/uL      RBC 3.07 x10 6/uL      MCV 96.1 fL      MCH 28.0 pg      MCHC 29.2 g/dL         Scheduled Meds:  Current Facility-Administered Medications  Medication Dose Route Frequency   amLODIPine  5 mg Oral Q12H  apixaban  5 mg Oral Q12H [place]   atorvastatin  40 mg Oral Daily   carvedilol  25 mg Oral Q12H   collagenase   Topical Q24H   gabapentin  600 mg Oral TID   insulin glargine  10 Units Subcutaneous QAM   insulin lispro  1-4 Units Subcutaneous QHS   insulin lispro  1-8 Units Subcutaneous TID AC   lactobacillus/streptococcus  1 capsule Oral Daily   pantoprazole  40 mg Oral BID   sevelamer  800 mg Oral TID MEALS   sodium hypochlorite   Irrigation Daily   vitamin C  500 mg Oral Daily   zinc sulfate  220 mg Oral Daily    PRN Meds:.acetaminophen **OR** acetaminophen, albuterol-ipratropium, Nursing communication: Adult Hypoglycemia Treatment Algorithm **AND** dextrose **AND** dextrose **AND** [DISCONTINUED] glucagon (rDNA), Nursing communication: Adult Hypoglycemia Treatment Algorithm **AND** dextrose **AND** dextrose **AND** glucagon (rDNA), hydrALAZINE, melatonin, naloxone, ondansetron **OR** ondansetron, oxyCODONE    Per Surgery Note:  ASSESSMENT:   * No surgery found * S/P   Open wound upper abdomen after pd  cath removal with Dr. Baird Cancer    Some necrotic tissue is present, no infection.  She discharged to a nursing facility and would not tolerate the regular wound vac  No infection is present  PLAN:   santos-- discussed with Sheri from wound care to wound daily  If not effective, could  Try vac at lower pressure -- 75 or 100      Krystal Eaton, RN, MSN  Utilization Review Case Management  [phone] (F)

## 2019-06-25 NOTE — Progress Notes (Signed)
[facility]  PROGRESS NOTE      Patient: [name]  Date: 06/25/2019   LOS: 0 Days  Admission Date: 06/23/2019   MRN: [id number]  Attending: Dolphus Jenny  Please contact me on the following epic chat       ASSESSMENT/PLAN     [name] is a 64 y.o. female admitted with Right sided numbness    Interval Summary:     Patient Active Hospital Problem List:    Right arm pain/edema  Acute superficial venous thrombosis s/p dialysis catheter  Though superficial, started eliquis due to significant pain and swelling  Did have recent GI bleed due to gastritis and was cleared for aspirin/pletal at that time  Has not had recurrent bleed, will monitor closely on eliquis  Discussed with CVIR, states that clot due to peripheral IV not to tunneled cath and no further intervention from their standpoint.  Obtain xrays, rule out underlying fracture given the extent of swelling.    abd post op wound- nonhealing, purulent  Appreciate wound care orders  Discussed with surgery, consulted-- recommended local wound care    ESRD on HD  Recent peritonitis pseudomonas/Ecoli  S/p dialysis   Removal of PD cath recently by Dr Baird Cancer    Recent upper GI bleed due to gastritis  Continue PPI BID, monitor H/H    HTN/hyperlipidemia  Amlodipine, atorvastatin    COPD    DM2 with hyperglycemia  bs 140-250  Continue insulin          DVT Prophylaxis:eliquis       Code Status: full    DISPO: pending improvement     Family Contact: none       SUBJECTIVE     [name] states right arm still swollen and painful but better since removing the rings. No sob chest pain fever chills nausea/vomiting    MEDICATIONS     Current Facility-Administered Medications   Medication Dose Route Frequency    amLODIPine  5 mg Oral Q12H    apixaban  5 mg Oral Q12H [facility]    atorvastatin  40 mg Oral Daily    carvedilol  25 mg Oral Q12H    collagenase   Topical Q24H    gabapentin  600 mg Oral TID    insulin glargine  10 Units Subcutaneous QAM     insulin lispro  1-4 Units Subcutaneous QHS    insulin lispro  1-8 Units Subcutaneous TID AC    lactobacillus/streptococcus  1 capsule Oral Daily    pantoprazole  40 mg Oral BID    sevelamer  800 mg Oral TID MEALS    sodium hypochlorite   Irrigation Daily    vitamin C  500 mg Oral Daily    zinc sulfate  220 mg Oral Daily       PHYSICAL EXAM     Vitals:    06/25/19 1602   BP: 119/63   Pulse: 64   Resp: 17   Temp: 98.2 F (36.8 C)   SpO2: 97%       Temperature: Temp  Min: 97.5 F (36.4 C)  Max: 99.7 F (37.6 C)  Pulse: Pulse  Min: 64  Max: 81  Respiratory: Resp  Min: 16  Max: 19  Non-Invasive BP: BP  Min: 119/63  Max: 164/94  Pulse Oximetry SpO2  Min: 95 %  Max: 97 %    Intake and Output Summary (Last 24 hours) at Date Time    Intake/Output Summary (  Last 24 hours) at 06/25/2019 1619  Last data filed at 06/24/2019 1900  Gross per 24 hour   Intake 120 ml   Output --   Net 120 ml         GEN APPEARANCE: Normal;  A&OX3  HEENT: PERLA; EOMI; Conjunctiva Clear  NECK: Supple; No bruits  CVS: RRR, S1, S2; No M/G/R  LUNGS: CTAB; No Wheezes; No Rhonchi: No rales  ABD: Soft; No TTP; + Normoactive BS  EXT: RUE tender, edematous  Skin exam:  no pallor  NEURO: CN 2-12 intact; No Focal neurological deficits  CAP REFILL:  Normal  MENTAL STATUS:  Normal    Exam done by Dolphus Jenny, MD on 06/25/19 at 2:33 PM      LABS     Recent Labs   Lab 06/25/19  0300 06/24/19  0503 06/23/19  1229   WBC 6.70 6.15 7.26   RBC 3.07* 3.13* 2.88*   Hgb 8.6* 8.8* 8.1*   Hematocrit 29.5* 30.7* 27.7*   MCV 96.1* 98.1* 96.2*   Platelets 141* 135* 127*       Recent Labs   Lab 06/25/19  0300 06/24/19  1811 06/24/19  0503 06/23/19  1229   Sodium 138 135* 139 138   Potassium 3.9 3.6 4.6 4.2   Chloride 100 98* 104 101   CO2 26 26 26 25    BUN 20.0* 14.0 30.0* 28.0*   Creatinine 3.6* 3.0* 5.6* 5.6*   Glucose 174* 322* 269* 374*   Calcium 8.2* 7.8* 7.8* 7.8*   Magnesium  --   --  1.9  --        Recent Labs   Lab 06/24/19  0503 06/23/19  1229   ALT 10 8    AST (SGOT) 11 7   Bilirubin, Total 0.1* 0.2   Albumin 2.5* 2.6*   Alkaline Phosphatase 129* 132*       Recent Labs   Lab 06/24/19  1100 06/23/19  1229   Creatine Kinase (CK) 25*  --    Troponin I  --  0.02       Recent Labs   Lab 06/23/19  1229   PT INR 1.0   PT 11.9   PTT 30       Microbiology Results     Procedure Component Value Units Date/Time    COVID-19 (SARS-COV-2) [place] [employer] Rapid) [[id number]] Collected: 06/23/19 1347    Specimen: Nasopharyngeal Swab from Nasopharynx Updated: 06/23/19 1414     Purpose of COVID testing Screening     SARS-CoV-2 Specimen Source Nasopharyngeal     SARS CoV 2 Overall Result Negative     Comment: Test performed using the Abbott ID NOW EUA assay.  Please see Fact Sheets for patients and providers located at:  http://[facility].info/  This test is for the qualitative detection of SARS-CoV-2  (COVID19) nucleic acid. Viral nucleic acids may persist in vivo,  independent of viability. Detection of viral nucleic acid does  not imply the presence of infectious virus, or that virus  nucleic acid is the cause of clinical symptoms. Negative  results should be treated as presumptive and, if inconsistent  with clinical signs and symptoms or necessary for patient  management, should be tested with an alternative molecular  assay. Negative results do not preclude SARS-CoV-2 infection  and should not be used as the sole basis for patient  management decisions. Invalid results may be due to inhibiting  substances in the specimen and recollection should occur.  Narrative:      o Collect and clearly label specimen type:  o Upper respiratory specimen: One Nasopharyngeal Dry Swab NO  Transport Media.  o Hand deliver to laboratory ASAP  Indication for testing->Extended care facility admission to  semi private room           RADIOLOGY     Upon my review:    Rowan Blase  4:19 PM 06/25/2019

## 2019-06-25 NOTE — Plan of Care (Signed)
Problem: Safety  Goal: Patient will be free from injury during hospitalization  Outcome: Progressing  Flowsheets (Taken 06/25/2019 2338)  Patient will be free from injury during hospitalization:   Assess patient's risk for falls and implement fall prevention plan of care per policy   Use appropriate transfer methods   Ensure appropriate safety devices are available at the bedside   Provide and maintain safe environment   Hourly rounding     Problem: Pain  Goal: Pain at adequate level as identified by patient  Outcome: Progressing  Flowsheets (Taken 06/25/2019 2338)  Pain at adequate level as identified by patient:   Identify patient comfort function goal   Reassess pain within 30-60 minutes of any procedure/intervention, per Pain Assessment, Intervention, Reassessment (AIR) Cycle   Evaluate if patient comfort function goal is met   Evaluate patient's satisfaction with pain management progress   Offer non-pharmacological pain management interventions     Problem: Compromised Tissue integrity  Goal: Damaged tissue is healing and protected  Outcome: Progressing  Flowsheets (Taken 06/25/2019 2338)  Damaged tissue is healing and protected:   Monitor/assess Braden scale every shift   Reposition patient every 2 hours and as needed unless able to reposition self   Keep intact skin clean and dry   Avoid shearing injuries     Problem: Moderate/High Fall Risk Score >5  Goal: Patient will remain free of falls  Outcome: Progressing  Flowsheets (Taken 06/25/2019 2300)  Moderate Risk (6-13):   LOW-Fall Interventions Appropriate for Low Fall Risk   LOW-Anticoagulation education for injury risk   MOD-Floor mat at bedside (where available) if appropriate   MOD-Remain with patient during toileting

## 2019-06-25 NOTE — Plan of Care (Signed)
Discharge Recommendation: SNF, Other(Comment)(May progress back to home w/ HH once pain is better RUE)  DME Recommended for Discharge: Front wheel walker    Is an Occupational Therapy Evaluation Indicated at this time? This patient is already on OT caseload.     Treatment/Interventions: Exercise, Gait training, Neuromuscular re-education, Functional transfer training, LE strengthening/ROM, Patient/family training, Equipment eval/education, Bed mobility  PT Frequency: 2-3x/wk     PMP Activity: Step 6 - Walks in Room  Distance Walked (ft) (Step 6,7): 30 Feet  (Please See Therapy Evaluation for device and assistance level needed)    Goals:   Goals  Goal Formulation: With patient  Time for Goal Acheivement: By time of discharge  Goals: Select goal  Pt Will Go Supine To Sit: independent, to maximize functional mobility and independence, Partly met(minA for BLE/trunk)  Pt Will Perform Sit to Stand: with supervision, to maximize functional mobility and independence, Partly met(CGA)  Pt Will Transfer Bed/Chair: with rolling walker, with supervision, to maximize functional mobility and independence, Not met(NT)  Pt Will Ambulate: 31-50 feet, with rolling walker, with stand by assist, to maximize functional mobility and independence, Partly met(modA)

## 2019-06-25 NOTE — Progress Notes (Signed)
Wound Ostomy Continence Progress Note    Date Time: 06/25/19 10:45 AM  Patient Name: LYLIE, BLACKLOCK  Consulting Service: WOCN    Assessment   Assessment   Specialty Bed:   [place] Mattress  Braden Score: Braden Scale Score: 20 (06/24/19 2000)  BMI: Body mass index is 44.92 kg/m.  DIET: Supervise For Meals Frequency: All meals  Diet cardiac    Spoke with Dr Ailene Ards regarding patient. Transcribed order for enzymatic debridement of wound. Called primary nurse to.    Plan   Plan  Wound care to abdominal wound:   1. Clean with or Dakin's Solution   2. Apply Santyl to wound bed. Cover/pack with saline moistened gauze.   4. Protect periwound skin with Cavilon no sting barrier film or spray.   5.Cover with secondary dressing such as sacral border foam dressings.   6. Change dressing change daily.     Santyl is a debriding agent ointment that contains papain, a proteolytic enzyme. It is used to debride necrotic tissue and liquefy slough.      Dakin's solution is used to reduce bacterial burden and promote healing.            Thank you for this consultation,    Hanley Hays, BSN, RN, [facility]  Wound, Ostomy, and Continence Nurse Coordinator  [facility]  [address] [place], [place] [address]  T [phone] Jerilynn Mages [phone]  [email or web address]

## 2019-06-25 NOTE — Progress Notes (Signed)
POST OPERATIVE PROGRESS NOTE  [facility]    Date Time: 06/25/19 10:40 AM  Patient Name: [name],[name]      ASSESSMENT:   * No surgery found * S/P   Open wound upper abdomen after pd cath removal with Dr. Baird Cancer    Some necrotic tissue is present, no infection.  She discharged to a nursing facility and would not tolerate the regular wound vac  No infection is present  PLAN:   santos-- discussed with Sheri from wound care to wound daily  If not effective, could  Try vac at lower pressure -- 75 or 100            SUBJECTIVE:   The patient is doing fairly well.  Post operative pain is well controlled with medications.  Current dietary status:  Diet consistent carbohydrate and renal Protein restriction: 80 GM Protein  Supervise For Meals Frequency: All meals and tolerating well.  Flatus: yes. BM:  no.  Additional complaints: none       OBJECTIVE:   Current Vitals:   Vitals:    06/25/19 0825   BP: 138/76   Pulse:    Resp:    Temp:    SpO2:        Intake and Output Summary (Last 24 hours):  I/O last 3 completed shifts:  In: 240 [P.O.:240]  Out: 2000 [Other:2000]    Labs:     Results     Procedure Component Value Units Date/Time    Glucose Whole Blood - POCT [[id number]]  (Abnormal) Collected: 06/25/19 0808     Updated: 06/25/19 0813     Whole Blood Glucose POCT 175 mg/dL     Basic Metabolic Panel [[id number]]  (Abnormal) Collected: 06/25/19 0300    Specimen: Blood Updated: 06/25/19 0424     Glucose 174 mg/dL      BUN 20.0 mg/dL      Creatinine 3.6 mg/dL      Calcium 8.2 mg/dL      Sodium 138 mEq/Young      Potassium 3.9 mEq/Young      Chloride 100 mEq/Young      CO2 26 mEq/Young      Anion Gap 12.0    Hemolysis index [[id number]] Collected: 06/25/19 0300     Updated: 06/25/19 0424     Hemolysis Index 0    GFR [[id number]] Collected: 06/25/19 0300     Updated: 06/25/19 0424     EGFR 15.4    CBC without differential [[id number]]  (Abnormal) Collected: 06/25/19 0300    Specimen: Blood Updated: 06/25/19 0406     WBC 6.70 x10  3/uL      Hgb 8.6 g/dL      Hematocrit 29.5 %      Platelets 141 x10 3/uL      RBC 3.07 x10 6/uL      MCV 96.1 fL      MCH 28.0 pg      MCHC 29.2 g/dL      RDW 14 %      MPV 11.9 fL      Nucleated RBC 0.0 /100 WBC      Absolute NRBC 0.00 x10 3/uL     Glucose Whole Blood - POCT [[id number]]  (Abnormal) Collected: 06/24/19 2012     Updated: 06/24/19 2018     Whole Blood Glucose POCT 278 mg/dL     Hemolysis index [[id number]] Collected: 06/24/19 1811     Updated: 06/24/19 1842     Hemolysis Index  11    GFR [[id number]] Collected: 06/24/19 1811     Updated: 06/24/19 1842     EGFR 11.0    Basic Metabolic Panel [[id number]]  (Abnormal) Collected: 06/24/19 1811    Specimen: Blood Updated: 06/24/19 1842     Glucose 322 mg/dL      BUN 14.0 mg/dL      Creatinine 3.0 mg/dL      Calcium 7.8 mg/dL      Sodium 135 mEq/Young      Potassium 3.6 mEq/Young      Chloride 98 mEq/Young      CO2 26 mEq/Young      Anion Gap 11.0    Glucose Whole Blood - POCT [[id number]]  (Abnormal) Collected: 06/24/19 1556     Updated: 06/24/19 1606     Whole Blood Glucose POCT 144 mg/dL           Rads:     Radiology Results (24 Hour)     ** No results found for the last 24 hours. **          Physical Exam:     Mental status - alert, oriented to person, place, and time, normal mood, behavior, speech, dress, motor activity, and thought processes  Chest - clear to auscultation, no wheezes, rales or rhonchi, symmetric air entry  Heart - normal rate, regular rhythm, normal S1, S2, no murmurs, rubs, clicks or gallops  Abdomen - soft, nontender, nondistended, no masses or organomegaly    Wound - clean, dry, necrotic tissue over good tissue. Minimal drainage noted  Extremities - peripheral pulses normal, no pedal edema, no clubbing or cyanosis      Signed by: Iran Sizer

## 2019-06-25 NOTE — Progress Notes (Signed)
64 y.o. female with a PMHx of hypertension, hyperlipidemia, insulin-dependent diabetes mellitus, COPD, ESRD currently on hemodialysis Tuesday Thursday Saturday, peritoneal dialysis associated Pseudomonas/E. coli peritonitis, upper GI bleed secondary to gastritis on 05/13/2019 and anemia of chronic kidney disease who presented with right sided numbness and weakness.  Patient with recent hospitalization from 05/27/2019 to 06/13/2019 due to PD associated peritonitis with Pseudomonas and E. coli s/p starting of hemodialysis on 05/26/2019 and exploratory laparotomy, lysis of adhesion and removal of PD catheter on 05/27/2019.  Patient's last hemodialysis done on 06/21/2019 without any complications.    Patient reported right face, right upper extremity and right lower extremity numbness x3 days associated with weakness of right upper extremity and right hand pain.    CM met with patient at bedside. Patient reports she lives alone in an elevator accessible apartment with no steps to enter. She ambulates with rollator assistance, is independent with ADLs. Receives her HD at [facility]. Has home health services through [facility], RN visits 2x/wk to assist with wound care/dressing changes. State daughter will transport home.    DCP: Home with resumption of HH and OP HD vs SNF, daughter to transport     06/25/19 1135   Patient Type   Within 30 Days of Previous Admission? Yes   Healthcare Decisions   Interviewed: Patient   Orientation/Decision Making Abilities of Patient Alert and Oriented x3, able to make decisions   Advance Directive Patient does not have advance directive   Prior to admission   Prior level of function Independent with ADLs;Ambulates with assistive device   Type of Residence Private residence   Home Layout Elevator   Have running water, electricity, heat, etc? Yes   Living Arrangements Alone   How do you get to your MD appointments? Daughter, medicaid transport   How do you get your groceries? Daughter    Who fixes your meals? Self, daughter   Who does your laundry? Self, daughter   Who picks up your prescriptions? Self, daughter   Dressing Independent   Grooming Independent   Feeding Independent   Bathing Independent   Toileting Independent   DME Currently at Home Other (Comment);BP Cuff;Glucometer  (Rollator)   Home Care/Community Services Dialysis;Home Health   Type of Dialysis: Hemodialysis   Where does patient receive dialysis? [facility]   How is patient transported to dialysis? Medicaid transport   What is weekly dialysis schedule? Mon-Wed-Fri   Discharge [place]   Patient expects to be discharged to: Home   Anticipated  plan discussed with: Same as interviewed   Mode of transportation: Private car (family member)   Does the patient have perscription coverage? Yes   Consults/Providers   PT Evaluation Needed 1   OT Evalulation Needed 1   SLP Evaluation Needed 2   Correct PCP listed in Epic? No (comment)  (No PCP)   Family and PCP   PCP on file was verified as the current PCP? Patient/family states they do not have a PCP   In case you are admitted, would like family notified? Yes   Name of family member to be notified Larene Beach, daughter   In case you are admitted, would like your PCP notified? Unknown   Important Message from Medicare Notice   Patient received 1st IMM Letter? n/a  (Medicaid)

## 2019-06-25 NOTE — PT Progress Note (Signed)
Physical Therapy Note    [facility]  Physical Therapy Treatment    Patient:  [name]  MRN#:  [id number]  Unit:  [place]  Room/Bed:  V4098/J1914-78    Time of treatment:  Time Calculation  PT Received On: 06/25/19  Start Time: 1955  Stop Time: 2018  Time Calculation (min): 23 min            Chart Review and Collaboration with Care Team: 8 minutes, not included in above time.    PT Visit Number: 2    Precautions:   Precautions  Weight Bearing Status: no restrictions  Other Precautions: Mod fall risk, bleeding    Personal Protective Equipment (PPE)  gloves, procedure mask, eye shield/covering and pt wore procedure mask    Updated X-Rays/Tests/Labs:  Lab Results   Component Value Date/Time    HGB 8.6 (L) 06/25/2019 03:00 AM    HCT 29.5 (L) 06/25/2019 03:00 AM    K 3.9 06/25/2019 03:00 AM    NA 138 06/25/2019 03:00 AM    INR 1.0 06/23/2019 12:29 PM    TROPI 0.02 06/23/2019 12:29 PM    TROPI 0.04 05/17/2019 06:20 PM    TROPI 0.05 05/17/2019 01:37 PM    TROPI 0.01 03/18/2006 06:10 PM       All imaging reviewed, please see chart for details.      Subjective:  "They had to cut my rings off my fingers were so swollen"    Patient Goal: Get hand to feel better    Pain Assessment  Pain Score: 8-severe pain  POSS Score: Awake and Alert  Pain Location: Hand;Wrist  Pain Orientation: Right  Pain Descriptors: Hervey Ard;Shooting  Pain Frequency: Continuous;Increases with movement;Other (Comment)(and w/ touch)  Effect of Pain on Daily Activities: moderate  Pain Intervention(s): Repositioned;Ambulation/increased activity;Elevated;Emotional support         Patient's medical condition is appropriate for Physical Therapy intervention at this time.  Patient is agreeable to participation in the therapy session.      Objective:  Observation of Patient/Vital Signs:  BP 135/76    Pulse 82    Temp 98.8 F (37.1 C) (Oral)    Resp 18    Ht 1.575 m (5\' 2" )    Wt 111.4 kg (245 lb 9.5 oz)    LMP  (LMP Unknown)  Comment: post menopausal   SpO2 98%    BMI 44.92 kg/m       Cognition/Neuro Status  Arousal/Alertness: Appropriate responses to stimuli  Attention Span: Appears intact  Orientation Level: Oriented X4  Memory: Appears intact  Following Commands: Follows all commands and directions without difficulty  Safety Awareness: minimal verbal instruction  Insights: Fully aware of deficits;Educated in safety awareness  Behavior: calm;cooperative;distractable      Functional Mobility  Supine to Sit: Minimal Assist;Increased Time;Increased Effort;HOB raised(for BLE and trunk.  Pt not using RUE to assist)  Scooting to HOB: Contact Guard Assist(sitting EOB moving to the L)  Scooting to EOB: Contact Guard Assist  Sit to Supine: Minimal Assist(for BLE into bed)  Sit to Stand: Minimal Assist;Increased Time;Increased Effort(using rocking motion.  Performed x3)  Stand to Sit: Contact Guard Assist     Locomotion  Ambulation: Moderate Assist;with standard walker  Pattern: R foot decreased clearance;L foot decreased clearance(uneven steps, not using RUE to grip, difficulty advancing walker, w/ each step would extend trunk post and require moda to maintain balance)  Distance Walked (ft) (Step 6,7): 30 Feet  Therapeutic Exercise w/ instructions for technique  Hip Flexion: EOB BLE x10  Knee AROM : EOB BLE x10  Ankle Pumps: toe taps/heel raises EOB x20  R Shoulder PROM/AAROM 5-7 reps  L Shoulder AAROM 5-7 reps       Neuro Re-Ed  Sitting Balance: sitting reaching activities;with instruction;with support;contact guard assist w/ LUE       Educated the patient to role of physical therapy, plan of care, goals of therapy and safety with mobility and ADLs with verbalized understanding.    Patient left in bed Clin Tech in room taking vitals      Assessment:  RUE pain is affecting mobility and increasing fall risk.  At this time pt is not safe for return to home and will need rehab.  May progress back to home w/ sup if RUE pain abates and pt is able  to utilize for mobility and ADL's.        PMP Activity: Step 6 - Walks in Room  Distance Walked (ft) (Step 6,7): 30 Feet    Plan:  Treatment/Interventions: Exercise, Gait training, Neuromuscular re-education, Functional transfer training, LE strengthening/ROM, Patient/family training, Equipment eval/education, Bed mobility      PT Frequency: 2-3x/wk   Continue plan of care.    Goals:  Goals  Goal Formulation: With patient  Time for Goal Acheivement: By time of discharge  Goals: Select goal  Pt Will Go Supine To Sit: independent, to maximize functional mobility and independence, Partly met(minA for BLE/trunk)  Pt Will Perform Sit to Stand: with supervision, to maximize functional mobility and independence, Partly met(CGA)  Pt Will Transfer Bed/Chair: with rolling walker, with supervision, to maximize functional mobility and independence, Not met(NT)  Pt Will Ambulate: 31-50 feet, with rolling walker, with stand by assist, to maximize functional mobility and independence, Partly met(modA)        DME Recommended for Discharge: Front wheel walker  Discharge Recommendation: SNF, Other(Comment)(May progress back to home w/ HH once pain is better Barry Dienes, PT [phone]  06/25/2019 8:33 PM    Monday - Wednesday 12:30 - 21:00  Thursday - Friday        8:00 - 16:30

## 2019-06-25 NOTE — Plan of Care (Signed)
Discharge Recommendation: SNF   DME Recommended for Discharge: [facility]    OT Frequency Recommended: 2-3x/wk     Is PT evaluation indicated at this time? This patient is already on PT caseload.     PMP Activity: Step 6 - Walks in Room  Distance Walked (ft) (Step 6,7): 15 Feet  (Please See Therapy Evaluation for device and assistance level needed)    Treatment Interventions: ADL retraining, Functional transfer training, UE strengthening/ROM, Endurance training, Patient/Family training, Equipment eval/education, Fine motor coordination activities, Compensatory technique education, Neuro muscular reeducation       Goal Formulation: Patient  Time For Goal Achievement: by time of discharge  ADL Goals  Patient will groom self: Supervision, at sinkside, by time of discharge, Not met  Patient will dress upper body: Minimal Assist, by time of discharge, Not met  Patient will dress lower body: Minimal Assist, by time of discharge, Not met  Patient will toilet: Supervision, by time of discharge, Not met  Mobility and Transfer Goals  Pt will perform functional transfers: Supervision, with rolling walker, by time of discharge, Not met

## 2019-06-25 NOTE — Plan of Care (Signed)
Problem: Renal Instability  Goal: Fluid and electrolyte balance are achieved/maintained  Outcome: Progressing  Flowsheets (Taken 06/24/2019 2004 by Waynetta Pean, RN)  Fluid and electrolyte balance are achieved/maintained:   Monitor intake and output every shift   Monitor/assess lab values and report abnormal values   Provide adequate hydration   Monitor daily weight     Problem: Every Day - Stroke  Goal: Stable vital signs and fluid balance  Outcome: Progressing  Flowsheets (Taken 06/25/2019 1844)  Stable vital signs and fluid balance:   Position patient for maximum circulation/cardiac output   Monitor and assess vitals every 4 hours or as ordered and hemodynamic parameters   Apply telemetry monitor as ordered   Encourage oral fluid intake     Problem: Pain  Goal: Pain at adequate level as identified by patient  Outcome: Progressing  Flowsheets (Taken 06/24/2019 2004 by Waynetta Pean, RN)  Pain at adequate level as identified by patient:   Identify patient comfort function goal   Assess for risk of opioid induced respiratory depression, including snoring/sleep apnea. Alert healthcare team of risk factors identified.   Assess pain on admission, during daily assessment and/or before any "as needed" intervention(s)     Problem: Safety  Goal: Patient will be free from injury during hospitalization  Outcome: Progressing  Flowsheets (Taken 06/24/2019 2004 by Waynetta Pean, RN)  Patient will be free from injury during hospitalization:   Assess patient's risk for falls and implement fall prevention plan of care per policy   Provide and maintain safe environment   Use appropriate transfer methods   Ensure appropriate safety devices are available at the bedside   Include patient/ family/ care giver in decisions related to safety

## 2019-06-26 DIAGNOSIS — M25531 Pain in right wrist: Secondary | ICD-10-CM

## 2019-06-26 LAB — IRON PROFILE
Iron Saturation: 22 % (ref 15–50)
Iron: 38 ug/dL — ABNORMAL LOW (ref 40–145)
TIBC: 169 ug/dL — ABNORMAL LOW (ref 265–497)
UIBC: 131 ug/dL (ref 126–382)

## 2019-06-26 LAB — BASIC METABOLIC PANEL
Anion Gap: 13 (ref 5.0–15.0)
BUN: 34 mg/dL — ABNORMAL HIGH (ref 7.0–19.0)
CO2: 23 mEq/L (ref 22–29)
Calcium: 8.1 mg/dL — ABNORMAL LOW (ref 8.5–10.5)
Chloride: 100 mEq/L (ref 100–111)
Creatinine: 4.8 mg/dL — ABNORMAL HIGH (ref 0.6–1.0)
Glucose: 201 mg/dL — ABNORMAL HIGH (ref 70–100)
Potassium: 4.2 mEq/L (ref 3.5–5.1)
Sodium: 136 mEq/L (ref 136–145)

## 2019-06-26 LAB — HEMOLYSIS INDEX
Hemolysis Index: 2 (ref 0–18)
Hemolysis Index: 4 (ref 0–18)
Hemolysis Index: 3 (ref 0–18)

## 2019-06-26 LAB — ECG 12-LEAD
Atrial Rate: 78 {beats}/min
P Axis: 34 degrees
P-R Interval: 152 ms
Q-T Interval: 408 ms
QRS Duration: 76 ms
QTC Calculation (Bezet): 465 ms
R Axis: 34 degrees
T Axis: 44 degrees
Ventricular Rate: 78 {beats}/min

## 2019-06-26 LAB — CBC
Absolute NRBC: 0 10*3/uL (ref 0.00–0.00)
Hematocrit: 28.2 % — ABNORMAL LOW (ref 34.7–43.7)
Hgb: 8.3 g/dL — ABNORMAL LOW (ref 11.4–14.8)
MCH: 28 pg (ref 25.1–33.5)
MCHC: 29.4 g/dL — ABNORMAL LOW (ref 31.5–35.8)
MCV: 95.3 fL (ref 78.0–96.0)
MPV: 12 fL (ref 8.9–12.5)
Nucleated RBC: 0 /100 WBC (ref 0.0–0.0)
Platelets: 140 10*3/uL — ABNORMAL LOW (ref 142–346)
RBC: 2.96 10*6/uL — ABNORMAL LOW (ref 3.90–5.10)
RDW: 14 % (ref 11–15)
WBC: 6.65 10*3/uL (ref 3.10–9.50)

## 2019-06-26 LAB — PHOSPHORUS: Phosphorus: 3.8 mg/dL (ref 2.3–4.7)

## 2019-06-26 LAB — GLUCOSE WHOLE BLOOD - POCT
Whole Blood Glucose POCT: 298 mg/dL — ABNORMAL HIGH (ref 70–100)
Whole Blood Glucose POCT: 167 mg/dL — ABNORMAL HIGH (ref 70–100)
Whole Blood Glucose POCT: 256 mg/dL — ABNORMAL HIGH (ref 70–100)
Whole Blood Glucose POCT: 333 mg/dL — ABNORMAL HIGH (ref 70–100)

## 2019-06-26 LAB — URIC ACID: Uric acid: 2.4 mg/dL — ABNORMAL LOW (ref 2.6–6.0)

## 2019-06-26 LAB — SEDIMENTATION RATE: Sed Rate: 102 mm/Hr — ABNORMAL HIGH (ref 0–20)

## 2019-06-26 LAB — GFR: EGFR: 11

## 2019-06-26 LAB — PTH, INTACT: PTH Intact: 255.6 pg/mL — ABNORMAL HIGH (ref 9.0–72.0)

## 2019-06-26 MED ORDER — INSULIN GLARGINE 100 UNIT/ML SC SOLN
15.00 [IU] | Freq: Two times a day (BID) | SUBCUTANEOUS | Status: DC
Start: 2019-06-26 — End: 2019-06-27
  Administered 2019-06-26: 22:00:00 15 [IU] via SUBCUTANEOUS
  Filled 2019-06-26: qty 15

## 2019-06-26 MED ORDER — SODIUM CHLORIDE 0.9 % IV BOLUS
100.0000 mL | INTRAVENOUS | Status: AC | PRN
Start: 2019-06-26 — End: 2019-06-26

## 2019-06-26 MED ORDER — PREDNISONE 20 MG PO TABS
30.0000 mg | ORAL_TABLET | Freq: Every morning | ORAL | Status: DC
Start: 2019-06-26 — End: 2019-06-27
  Administered 2019-06-26: 17:00:00 30 mg via ORAL
  Filled 2019-06-26: qty 1

## 2019-06-26 MED ORDER — SODIUM CHLORIDE 0.9 % IV BOLUS
250.0000 mL | INTRAVENOUS | Status: AC | PRN
Start: 2019-06-26 — End: 2019-06-26

## 2019-06-26 MED ORDER — OXYCODONE HCL 5 MG PO TABS
5.0000 mg | ORAL_TABLET | Freq: Four times a day (QID) | ORAL | Status: DC
Start: 2019-06-26 — End: 2019-06-29
  Administered 2019-06-26 – 2019-06-29 (×10): 5 mg via ORAL
  Filled 2019-06-26 (×10): qty 1

## 2019-06-26 MED ORDER — ALBUMIN HUMAN 25 % IV SOLN
100.0000 mL | INTRAVENOUS | Status: AC | PRN
Start: 2019-06-26 — End: 2019-06-26

## 2019-06-26 NOTE — Plan of Care (Signed)
Problem: Renal Instability  Goal: Fluid and electrolyte balance are achieved/maintained  Outcome: Progressing  Flowsheets (Taken 06/26/2019 1042)  Fluid and electrolyte balance are achieved/maintained: Monitor/assess lab values and report abnormal values  Goal: Free from infection  Outcome: Progressing  Flowsheets (Taken 06/26/2019 1042)  Free from infection: Monitor/assess for signs and symptoms of infection     Problem: Patient Receiving Advanced Renal Therapies  Goal: Therapy access site remains intact  Outcome: Progressing  Flowsheets (Taken 06/26/2019 1042)  Therapy access site remains intact:   Assess therapy access site   Change therapy access site dressing as needed

## 2019-06-26 NOTE — Plan of Care (Signed)
Problem: Pain  Goal: Pain at adequate level as identified by patient  Outcome: Progressing  Flowsheets (Taken 06/25/2019 2338)  Pain at adequate level as identified by patient:   Identify patient comfort function goal   Reassess pain within 30-60 minutes of any procedure/intervention, per Pain Assessment, Intervention, Reassessment (AIR) Cycle   Evaluate if patient comfort function goal is met   Evaluate patient's satisfaction with pain management progress   Offer non-pharmacological pain management interventions     Problem: Compromised Tissue integrity  Goal: Damaged tissue is healing and protected  Outcome: Progressing  Flowsheets (Taken 06/25/2019 2338)  Damaged tissue is healing and protected:   Monitor/assess Braden scale every shift   Reposition patient every 2 hours and as needed unless able to reposition self   Keep intact skin clean and dry   Avoid shearing injuries     Problem: Moderate/High Fall Risk Score >5  Goal: Patient will remain free of falls  Outcome: Progressing  Flowsheets (Taken 06/26/2019 2036)  Moderate Risk (6-13):   LOW-Fall Interventions Appropriate for Low Fall Risk   MOD-Apply bed exit alarm if patient is confused   MOD-Floor mat at bedside (where available) if appropriate   MOD-Remain with patient during toileting     Problem: Compromised Hemodynamic Status  Goal: Vital signs and fluid balance maintained/improved  Outcome: Progressing  Flowsheets (Taken 06/26/2019 2054)  Vital signs and fluid balance are maintained/improved:   Position patient for maximum circulation/cardiac output   Monitor/assess vitals and hemodynamic parameters with position changes   Monitor/assess lab values and report abnormal values     Problem: Peripheral Neurovascular Impairment  Goal: Extremity color, movement, sensation are maintained or improved  Outcome: Progressing  Flowsheets (Taken 06/26/2019 2054)  Extremity color, movement, sensation are maintained or improved: Increase mobility as tolerated/progressive  mobility

## 2019-06-26 NOTE — UM Notes (Signed)
LDJT#[id number]  Discharge Summary    Auth Number :  [id number]    Discharge Date -  06/13/2019  6:46 PM    ER ADMIT DATE AND TIME: 05/27/2019  4:33 AM  OBS ADMIT DATE: 05/27/19  IP ADMIT DATE: 05/29/19      Hospital Course  Presentation History   Per H&P "Leone Payor McKethanis a 64 y.o.femalewith a PMHx of hypertension, hyperlipidemia, insulin-dependent diabetes mellitus, COPD, end-stage renal disease on peritoneal dialysis  Recently switched to hemodialysis and discharged yesterdayreturnto the ED with recurrent abdominal pain associated with Non bloodydiarrhea, nausea vomiting of nonbilious and nonbloody vomitus. Patient states that she continued to have loose watery stools on arriving at home yesterday followed by nausea vomiting 2-3 times, described abdominal pain as left-sided pain intensity greater than 10, pain is worse with palpation. She denies fever/chills, dysuria, chest pain, shortness of breath, blood in stool or vomitus. She had dialysis yesterday prior to discharge and have not arranged transportation for outpatient dialysis yet.    In the emergency room, CT abdomen pelvis showed partial small bowel obstruction, trace bilateral pleural effusions, revealed WBC of 15.22, H&H of 10.4/34.1,stool studies pending, infectious disease Dr. Lamona Curl Surgery Dr. Myrna Blazer".    Hospital Course (14 Days):      Patient was admitted for PD peritonitis. Peritoneal fluid grew Pseudomonas. ID recommended IV Zosyn which was deescalated to Cipro based on susceptibility. ID had recommended continuing antibiotics till 5/19. Given increased abdominal pain zosyn was resumed 5/16.  Patient continued antibiotics and this was discontinued on 5/19.  ID recommended no antibiotics on discharge.    Patient was also treated prophylactically for C. difficile. P.o. vancomycin was used. ID recommend continue p.o. vancomycin till 5/19. Patient's PD catheter was removed and she was started on hemodialysis.  Outpatient hemodialysis was set up prior to discharge. PT assessed patient and recommended SNF but patient now wants to go home with home PT/OT.     Over this past weekend patient developed increasing surgical site pain. Continues to have persistent erythema and exquisite tenderness over certain areas of the surgical wound. No discharge was noted. On evaluation with CAT scan on 5/16 she was found to have evidence of surgical site infection with possible air at this time. Bedside debridement revealed 5 cc of serous vs purulent collection. After debridement a wound VAC was applied. No wound dehiscence noted.  Patient had pain due to wound VAC in place and wound VAC was discontinued and surgery assessed patient and recommended okay to discharge home with wound care nurse arranged.  Patient will follow up with surgery in 1 to 2 weeks for wound check.  Plan of discharge and follow-up appointments were discussed with patient at the bedside and she voiced understanding.      Generalized abdominal pain (05/27/2019)  Peritoneal dialysis catheter dysfunction, initial encounter (05/12/2019)  Peritonitis (05/24/2019)  Assessment: PD catheter removed by general surgery with exploratory laparotomy on 05/28/2019. Pleural fluid growing Pseudomonas and E. Coli. Wound site complication noted with increased pain warranting debridement and wound vac placement on 5/17.   Plan:   Follow up wound culture  Discussed with general surgery and infectious disease  Appreciate wound VAC placement by wound nurse  Completedbroad-spectrum antibiotic coverage withIV Zosyn.Zosyn discontinued on 5/19  Follow-up on cultures  Status post oral vancomycinforC. difficile prophylaxis   incentive spirometry use encouraged   Pain regimen as below.   Wound VAC changetoday    Bowel incontinence   Assessment-unclear if this is a complication  of prior back injury; however this problem is chronic and has been going on for the past  year  Plan-minimize risk for bedsores with frequent turning and quick response to any accidents    ESRD (end stage renal disease) on dialysis (05/24/2019)  Assessment: Patient was on PD prior to admission; she has been switched to hemodialysis for now; outpatient HD has been arranged for  Plan: Appreciate nephrology inputs   Outpatient dialysis to continue on a Tuesday Thursday Saturday schedule    Thrombocytopenia (05/31/2019)  Assessment: Could be due to the acute infection as above; platelet counts are stable  Plan: Okay to continue heparin for DVT prophylaxis  Monitor daily CBC    Hypotension (05/31/2019)  Assessment: Likely due to the infection above  Plan: Antibiotics as above    Type 2 diabetes mellitus, with long-term current use of insulin (05/31/2019)  Assessment: Chronic and unknown control  Plan: Continue with insulin as above  Diabetic diet     GERD (gastroesophageal reflux disease) (05/31/2019)  Assessment: Chronic and unknown control  Plan: c/w home dose of protonix  Continue maalox       NAME: DAMIYAH Young             MR#: [id number]      Patient Address:  [place] [address]    Patient phone: [phone] (home)     PATIENT NAME: [name],[name]  DOB: 1955/12/19   PMH:  has a past medical history of Chronic obstructive pulmonary disease, Diabetes mellitus, Hyperlipidemia, Hypertension, and Sleep apnea (2018).  PSH:  has a past surgical history that includes Ovary surgery (Left); Joint replacement; Joint replacement; EGD, BIOPSY (N/A, 05/14/2019); Tunneled Cath Placement (Permcath) (Bilateral, 05/23/2019); REMOVAL, FOREIGN BODY (N/A, 05/27/2019); and EXPLORATORY LAPAROTOMY (N/A, 05/27/2019).      DIAGNOSIS:     ICD-10-CM    1. Generalized abdominal pain  R10.84    2. Vomiting and diarrhea  R11.10     R19.7    3. End stage renal disease on dialysis  N18.6     Z99.2    4. Peritoneal dialysis catheter in place  Z99.2    5. Partial small bowel obstruction   K56.600

## 2019-06-26 NOTE — Progress Notes (Signed)
[facility]  PROGRESS NOTE      Patient: [name]  Date: 06/26/2019   LOS: 0 Days  Admission Date: 06/23/2019   MRN: [id number]  Attending: Dolphus Jenny  Please contact me on the following epic chat       ASSESSMENT/PLAN     SHANTAY SONN is a 64 y.o. female admitted with Right sided numbness    Interval Summary:     Patient Active Hospital Problem List:    Right arm pain/edema- triquetral fracture, superficial VT, possible pseudogout  Acute superficial venous thrombosis s/p dialysis catheter  Though superficial, started eliquis due to significant pain and swelling  Will aim to stop this at discharge esp since starting steroids  Did have recent GI bleed due to gastritis and was cleared for aspirin/pletal at that time  Has not had recurrent bleed, will monitor closely on eliquis  Discussed with [facility], states that clot due to peripheral IV not to tunneled cath and no further intervention from their standpoint.  xrays showed triquetral fracture, appreciate surgery consult  Will start treatment for pseudogout with steroids-- with caution given on Eliquis + recent gastritis and GI bleed- rapid taper if allows    abd post op wound- nonhealing, purulent  Appreciate wound care orders  Discussed with surgery, consulted-- recommended local wound care    ESRD on HD  Recent peritonitis pseudomonas/Ecoli  S/p dialysis   Removal of PD cath recently by Dr Baird Cancer    Recent upper GI bleed due to gastritis  Continue PPI BID, monitor H/H    HTN/hyperlipidemia  Amlodipine, atorvastatin    COPD    DM2 with hyperglycemia  bs 140-250  Continue insulin          DVT Prophylaxis:eliquis       Code Status: full    DISPO: pending improvement     Family Contact: none       SUBJECTIVE     [name] states right arm swollen painful. No sob chest pain fever chills nausea/vomiting cough    MEDICATIONS     Current Facility-Administered Medications   Medication Dose Route Frequency    amLODIPine  5 mg Oral Q12H     apixaban  5 mg Oral Q12H SCH    atorvastatin  40 mg Oral Daily    carvedilol  25 mg Oral Q12H    collagenase   Topical Q24H    gabapentin  600 mg Oral TID    insulin glargine  10 Units Subcutaneous Q12H    insulin lispro  1-4 Units Subcutaneous QHS    insulin lispro  1-8 Units Subcutaneous TID AC    lactobacillus/streptococcus  1 capsule Oral Daily    oxyCODONE  5 mg Oral Q6H    pantoprazole  40 mg Oral BID    predniSONE  30 mg Oral QAM W/BREAKFAST    sevelamer  800 mg Oral TID MEALS    sodium hypochlorite   Irrigation Daily    vitamin C  500 mg Oral Daily    zinc sulfate  220 mg Oral Daily       PHYSICAL EXAM     Vitals:    06/26/19 1534   BP: 135/74   Pulse: 80   Resp: 19   Temp:    SpO2: 98%       Temperature: Temp  Min: 97.9 F (36.6 C)  Max: 99.1 F (37.3 C)  Pulse: Pulse  Min: 66  Max: 82  Respiratory: Resp  Min: 16  Max: 20  Non-Invasive BP: BP  Min: 108/65  Max: 161/76  Pulse Oximetry SpO2  Min: 93 %  Max: 100 %    Intake and Output Summary (Last 24 hours) at Date Time    Intake/Output Summary (Last 24 hours) at 06/26/2019 1638  Last data filed at 06/26/2019 1325  Gross per 24 hour   Intake --   Output 2500 ml   Net -2500 ml         GEN APPEARANCE: Normal;  A&OX3  HEENT: PERLA; EOMI; Conjunctiva Clear  NECK: Supple; No bruits  CVS: RRR, S1, S2; No M/G/R  LUNGS: CTAB; No Wheezes; No Rhonchi: No rales  ABD: Soft; No TTP; + Normoactive BS  EXT: RUE tender, edematous  Skin exam:  no pallor  NEURO: CN 2-12 intact; No Focal neurological deficits  CAP REFILL:  Normal  MENTAL STATUS:  Normal    Exam done by Dolphus Jenny, MD on 06/26/19 at 2:33 PM      LABS     Recent Labs   Lab 06/26/19  0428 06/25/19  0300 06/24/19  0503   WBC 6.65 6.70 6.15   RBC 2.96* 3.07* 3.13*   Hgb 8.3* 8.6* 8.8*   Hematocrit 28.2* 29.5* 30.7*   MCV 95.3 96.1* 98.1*   Platelets 140* 141* 135*       Recent Labs   Lab 06/26/19  0428 06/25/19  0300 06/24/19  1811 06/24/19  0503 06/23/19  1229   Sodium 136 138 135* 139 138    Potassium 4.2 3.9 3.6 4.6 4.2   Chloride 100 100 98* 104 101   CO2 23 26 26 26 25    BUN 34.0* 20.0* 14.0 30.0* 28.0*   Creatinine 4.8* 3.6* 3.0* 5.6* 5.6*   Glucose 201* 174* 322* 269* 374*   Calcium 8.1* 8.2* 7.8* 7.8* 7.8*   Magnesium  --   --   --  1.9  --        Recent Labs   Lab 06/24/19  0503 06/23/19  1229   ALT 10 8   AST (SGOT) 11 7   Bilirubin, Total 0.1* 0.2   Albumin 2.5* 2.6*   Alkaline Phosphatase 129* 132*       Recent Labs   Lab 06/24/19  1100 06/23/19  1229   Creatine Kinase (CK) 25*  --    Troponin I  --  0.02       Recent Labs   Lab 06/23/19  1229   PT INR 1.0   PT 11.9   PTT 30       Microbiology Results     Procedure Component Value Units Date/Time    COVID-19 (SARS-COV-2) [facility]) [[id number]] Collected: 06/23/19 1347    Specimen: Nasopharyngeal Swab from Nasopharynx Updated: 06/23/19 1414     Purpose of COVID testing Screening     SARS-CoV-2 Specimen Source Nasopharyngeal     SARS CoV 2 Overall Result Negative     Comment: Test performed using the Abbott ID NOW EUA assay.  Please see Fact Sheets for patients and providers located at:  http://olson-hall.info/  This test is for the qualitative detection of SARS-CoV-2  (COVID19) nucleic acid. Viral nucleic acids may persist in vivo,  independent of viability. Detection of viral nucleic acid does  not imply the presence of infectious virus, or that virus  nucleic acid is the cause of clinical symptoms. Negative  results should be treated as presumptive and, if inconsistent  with  clinical signs and symptoms or necessary for patient  management, should be tested with an alternative molecular  assay. Negative results do not preclude SARS-CoV-2 infection  and should not be used as the sole basis for patient  management decisions. Invalid results may be due to inhibiting  substances in the specimen and recollection should occur.         Narrative:      o Collect and clearly label specimen type:  o Upper respiratory specimen: One  Nasopharyngeal Dry Swab NO  Transport Media.  o Hand deliver to laboratory ASAP  Indication for testing->Extended care facility admission to  semi private room           RADIOLOGY     Upon my review:    Rowan Blase  4:38 PM 06/26/2019

## 2019-06-26 NOTE — Progress Notes (Signed)
[facility] PROGRESS NOTE  Aaron Edelman, x [phone] [facility] Spectralink)      Date Time: 06/26/19 7:02 AM  Patient Name: [name]  Attending Physician: Dolphus Jenny, MD    CC: follow-up CKD, CHF    Assessment:     1. CKD- on HD TTS  2. Anemia- CKD and chronic disease; Hb 8.6. Low Fe in the past.  3. HTN  4.RUE pain/swelling- superficial vein thrombosis RUE.   5. Nonhealing surgical wound from infected PD catheter removal   6. Type 2 DM-uncontrolled  7. Hypocalcemia- low albumin; check Phos  8. Obesity      Recommendations:     Hemodialysis today per usual routine  Follow-up iron studies    Case discussed with: pt, HD RN    Armando Reichert, MD  [facility]  [phone] (office)  X [phone] ([facility])      Subjective: A little more comfortable today    Review of Systems:   Review of Systems - No SOB, CP    Physical Exam:     Vitals:    06/25/19 1928 06/25/19 2108 06/25/19 2343 06/26/19 0422   BP: 135/76 161/76 117/66 130/86   Pulse: 82 75 73 75   Resp: 18  18 18    Temp: 98.8 F (37.1 C)  99.1 F (37.3 C) 98.6 F (37 C)   TempSrc: Oral  Oral Oral   SpO2: 98%  93% 97%   Weight:       Height:           Intake and Output Summary (Last 24 hours) at Date Time    Intake/Output Summary (Last 24 hours) at 06/26/2019 0702  Last data filed at 06/25/2019 1602  Gross per 24 hour   Intake 240 ml   Output 0 ml   Net 240 ml       General: awake, alert, oriented x 3, no acute distress.  Cardiovascular: regular rate and rhythm, no murmurs, rubs or gallops  Lungs: clear to auscultation bilaterally, without wheezing, rhonchi, or rales  Abdomen: RLQ dressing, soft, non-tender, non-distended, normoactive bowel sounds  Extremities: + RUE edema  Other: RIJ Permcath      Meds:      Scheduled Meds: PRN Meds:    amLODIPine, 5 mg, Oral, Q12H  apixaban, 5 mg, Oral, Q12H SCH  atorvastatin, 40 mg, Oral, Daily  carvedilol, 25 mg, Oral, Q12H  collagenase, , Topical, Q24H  gabapentin, 600 mg, Oral,  TID  insulin glargine, 10 Units, Subcutaneous, Q12H  insulin lispro, 1-4 Units, Subcutaneous, QHS  insulin lispro, 1-8 Units, Subcutaneous, TID AC  lactobacillus/streptococcus, 1 capsule, Oral, Daily  pantoprazole, 40 mg, Oral, BID  sevelamer, 800 mg, Oral, TID MEALS  sodium hypochlorite, , Irrigation, Daily  vitamin C, 500 mg, Oral, Daily  zinc sulfate, 220 mg, Oral, Daily          Continuous Infusions:   acetaminophen, 650 mg, Q6H PRN   Or  acetaminophen, 650 mg, Q6H PRN  albuterol-ipratropium, 3 mL, Q6H PRN  dextrose, 15 g of glucose, PRN   And  dextrose, 12.5 g, PRN  dextrose, 15 g of glucose, PRN   And  dextrose, 12.5 g, PRN   And  glucagon (rDNA), 1 mg, PRN  hydrALAZINE, 25 mg, Q6H PRN  melatonin, 3 mg, QHS PRN  naloxone, 0.2 mg, PRN  ondansetron, 4 mg, Q6H PRN   Or  ondansetron, 4 mg, Q6H PRN  oxyCODONE, 5 mg, Q6H PRN  Labs:     Recent Labs   Lab 06/26/19  0428 06/25/19  0300 06/24/19  0503   WBC 6.65 6.70 6.15   Hgb 8.3* 8.6* 8.8*   Hematocrit 28.2* 29.5* 30.7*   Platelets 140* 141* 135*     Recent Labs   Lab 06/25/19  0300 06/24/19  1811 06/24/19  0503 06/23/19  1229 06/23/19  1229   Sodium 138 135* 139  More results in Results Review 138   Potassium 3.9 3.6 4.6  More results in Results Review 4.2   Chloride 100 98* 104  More results in Results Review 101   CO2 26 26 26   More results in Results Review 25   BUN 20.0* 14.0 30.0*  More results in Results Review 28.0*   Creatinine 3.6* 3.0* 5.6*  More results in Results Review 5.6*   Calcium 8.2* 7.8* 7.8*  More results in Results Review 7.8*   Albumin  --   --  2.5*  --  2.6*   Magnesium  --   --  1.9  --   --    Glucose 174* 322* 269*  More results in Results Review 374*   EGFR 15.4 19.0 9.2  More results in Results Review 9.2   More results in Results Review = values in this interval not displayed.             Invalid input(s): LEUKOCYTESUR        Imaging personally reviewed, including: XR Humerus Right AP Lateral    Result Date: 06/25/2019     Unremarkable right humerus. Alba Destine, MD  06/25/2019 7:26 PM    XR Wrist Right PA Lateral And Oblique    Result Date: 06/25/2019   Triquetral fracture. Alba Destine, MD  06/25/2019 7:28 PM          Signed by: Armando Reichert, MD

## 2019-06-26 NOTE — Progress Notes (Signed)
Patient is alert and oriented times four. dayalized today, took of 2.5l of fluid. She has severe pain on her right wrist, managed with pain med and ice pack. Splint applied on her right hand per the physician order. Voided well, no bowel movement during the shift. Blood sugar checked and covered accordingly. No acute distress or sob seen during the day.

## 2019-06-26 NOTE — Consults (Signed)
CONSULTATION    Date Time: 06/26/19 4:36 PM  Patient Name: [name]  Requesting Physician: Dolphus Jenny, MD      Reason for Consultation:   R wrist pain    Assessment:   R wrist swelling and pain, concern for pseudogout vs gout    Plan:   - Will order removable wrist splint for right wrist. Can be taken off for dressing/showering  - will check labs for infection/crystalline arthropathy (ESR, CRP, uric acid)  - agree with empiric tx for pseudogout in the meantime  - I have reviewed the XR--the patient has no recent hx of fall, and pain out of proportion to a small avulsion wrist fx. she has exquisite pain in the wrist with any motion, which is more consistent with gout or less likely infection. I think this XR finding is an old injury and not the cause of her current pain.  - elevation and recommend icing the wrist 3x/day for 20 min    History:   ATINA FEELEY is a 64 y.o. female who presents to the hospital on 06/23/2019 with concern for a R sided stroke, found to have right wrist pain and swelling that started atraumatically a few days ago. She denies any history of recent falls. She says the wrist started hurting her out of the blue one morning. She does have a hx of gout in her foot in the past. She denies fevers or chills     Past Medical History:     Past Medical History:   Diagnosis Date    Chronic obstructive pulmonary disease     Diabetes mellitus     Hyperlipidemia     Hypertension     Sleep apnea 2018       Past Surgical History:     Past Surgical History:   Procedure Laterality Date    EGD, BIOPSY N/A 05/14/2019    Procedure: EGD, BIOPSY;  Surgeon: Ronie Spies, MD;  Location: [facility];  Service: Gastroenterology;  Laterality: N/A;    EXPLORATORY LAPAROTOMY N/A 05/27/2019    Procedure: EXPLORATORY LAPAROTOMY;  Surgeon: Herbert Moors., MD;  Location: [facility];  Service: General;  Laterality: N/A;    JOINT REPLACEMENT      shoulder    JOINT REPLACEMENT      knee     OVARY SURGERY Left     Removal    REMOVAL, FOREIGN BODY N/A 05/27/2019    Procedure: REMOVAL, PERITONEAL DIALYSIS CATHETER;  Surgeon: Herbert Moors., MD;  Location: [facility];  Service: General;  Laterality: N/A;    TUNNELED CATH PLACEMENT ([place]) Bilateral 05/23/2019    Procedure: TUNNELED CATH PLACEMENT;  Surgeon: Jacques Earthly, MD;  Location: [facility];  Service: Interventional Radiology;  Laterality: Bilateral;       Family History:     Family History   Problem Relation Age of Onset    Diabetes Mother     Hypertension Mother     Diabetes Father     Hypertension Father     Diabetes Sister     Hypertension Sister     Diabetes Brother     Hypertension Brother     Diabetes Paternal Aunt     Diabetes Paternal Uncle        Social History:     Social History     Socioeconomic History    Marital status: Legally Separated     Spouse name: Not on file  Number of children: Not on file    Years of education: Not on file    Highest education level: Not on file   Occupational History    Not on file   Tobacco Use    Smoking status: Never Smoker    Smokeless tobacco: Never Used   Substance and Sexual Activity    Alcohol use: Never    Drug use: Never    Sexual activity: Not Currently   Other Topics Concern    Not on file   Social History Narrative    Not on file     Social Determinants of Health     Financial Resource Strain:     Difficulty of Paying Living Expenses:    Food Insecurity:     Worried About Charity fundraiser in the Last Year:     Arboriculturist in the Last Year:    Transportation Needs:     Film/video editor (Medical):     Lack of Transportation (Non-Medical):    Physical Activity:     Days of Exercise per Week:     Minutes of Exercise per Session:    Stress:     Feeling of Stress :    Social Connections:     Frequency of Communication with Friends and Family:     Frequency of Social Gatherings with Friends and Family:     Attends Religious  Services:     Active Member of Clubs or Organizations:     Attends Archivist Meetings:     Marital Status:    Intimate Partner Violence:     Fear of Current or Ex-Partner:     Emotionally Abused:     Physically Abused:     Sexually Abused:        Allergies:     Allergies   Allergen Reactions    Lisinopril Hives    Metformin Hives       Medications:     Current Facility-Administered Medications   Medication Dose Route Frequency    amLODIPine  5 mg Oral Q12H    apixaban  5 mg Oral Q12H SCH    atorvastatin  40 mg Oral Daily    carvedilol  25 mg Oral Q12H    collagenase   Topical Q24H    gabapentin  600 mg Oral TID    insulin glargine  10 Units Subcutaneous Q12H    insulin lispro  1-4 Units Subcutaneous QHS    insulin lispro  1-8 Units Subcutaneous TID AC    lactobacillus/streptococcus  1 capsule Oral Daily    oxyCODONE  5 mg Oral Q6H    pantoprazole  40 mg Oral BID    sevelamer  800 mg Oral TID MEALS    sodium hypochlorite   Irrigation Daily    vitamin C  500 mg Oral Daily    zinc sulfate  220 mg Oral Daily       Review of Systems:   A comprehensive review of systems was: Negative except right wrist pain    Physical Exam:     Vitals:    06/26/19 1534   BP: 135/74   Pulse: 80   Resp: 19   Temp:    SpO2: 98%       Intake and Output Summary (Last 24 hours) at Date Time    Intake/Output Summary (Last 24 hours) at 06/26/2019 1636  Last data filed at 06/26/2019 1325  Gross per 24 hour  Intake    Output 2500 ml   Net -2500 ml       General appearance - oriented to person, place, and time and overweight  Mental status - alert, oriented to person, place, and time    Labs Reviewed:     Results     Procedure Component Value Units Date/Time    Glucose Whole Blood - POCT [[id number]]  (Abnormal) Collected: 06/26/19 1536     Updated: 06/26/19 1553     Whole Blood Glucose POCT 256 mg/dL     PTH, Intact [[id number]]  (Abnormal) Collected: 06/26/19 0428    Specimen: Blood Updated: 06/26/19 1349     PTH  Intact 255.6 pg/mL     Glucose Whole Blood - POCT [[id number]]  (Abnormal) Collected: 06/26/19 1132     Updated: 06/26/19 1151     Whole Blood Glucose POCT 167 mg/dL     Hemolysis index [[id number]] Collected: 06/26/19 0428     Updated: 06/26/19 0915     Hemolysis Index 2    GFR [[id number]] Collected: 06/26/19 0428     Updated: 06/26/19 0915     EGFR 35.4    Basic Metabolic Panel [[id number]]  (Abnormal) Collected: 06/26/19 0428    Specimen: Blood Updated: 06/26/19 0915     Glucose 201 mg/dL      BUN 34.0 mg/dL      Creatinine 4.8 mg/dL      Calcium 8.1 mg/dL      Sodium 136 mEq/L      Potassium 4.2 mEq/L      Chloride 100 mEq/L      CO2 23 mEq/L      Anion Gap 13.0    Phosphorus [[id number]] Collected: 06/26/19 0428    Specimen: Blood Updated: 06/26/19 0915     Phosphorus 3.8 mg/dL     Glucose Whole Blood - POCT [[id number]]  (Abnormal) Collected: 06/26/19 0752     Updated: 06/26/19 0756     Whole Blood Glucose POCT 298 mg/dL     Hemolysis index [[id number]] Collected: 06/26/19 0428     Updated: 06/26/19 0747     Hemolysis Index 4    IRON PROFILE [[id number]]  (Abnormal) Collected: 06/26/19 0428     Updated: 06/26/19 0747     Iron 38 ug/dL      UIBC 131 ug/dL      TIBC 169 ug/dL      Iron Saturation 22 %     CBC without differential [[id number]]  (Abnormal) Collected: 06/26/19 0428    Specimen: Blood Updated: 06/26/19 0457     WBC 6.65 x10 3/uL      Hgb 8.3 g/dL      Hematocrit 28.2 %      Platelets 140 x10 3/uL      RBC 2.96 x10 6/uL      MCV 95.3 fL      MCH 28.0 pg      MCHC 29.4 g/dL      RDW 14 %      MPV 12.0 fL      Nucleated RBC 0.0 /100 WBC      Absolute NRBC 0.00 x10 3/uL               Rads:   XR R wrist reviewed: I personally reviewed the imaging and agree with the following:  Four views of the right wrist demonstrate a  triquetral fracture. No dislocation. Remainder the bony structures are  unremarkable. The carpal joint is maintained.    Signed  by: Karl Bales

## 2019-06-26 NOTE — Progress Notes (Signed)
Alert and oriented x4. Normal sinus rhythm on telemetry monitor. Abdomen wound was cleansed and dressing was changed. Serosanguinous drainage was noted when dressing was changed. Slept well throughout shift. Slept well throughout shift.

## 2019-06-26 NOTE — Progress Notes (Signed)
Arrived in room,  Pt is alert and oriented.  Pt denied pain and sob.  R permacath access is optimally working.  Dressing clean, dry and intact. Pt finishing up breakfast. Timeouts and safety checks done. Will closely monitor the pt.       06/26/19 0935   Bedside Nurse Communication   Name of bedside RN - pre dialysis Anthony Sar, RN   Treatment Initiation- With Dialysis Precautions   Time Out/Safety Check Completed Yes   Consent for HD signed for this hospitalization (Date) 05/23/19   Consent for HD signed for this hospitalization (Time) 1044   Blood Consent Verified N/A   Dialysis Precautions All Connections Secured;Saline Line Double Clamped;Venous Parameters Set;Arterial Parameters Set;Air Foam Detecctor Engaged   Dialysis Treatment Type Routine;Bedside   Is patient diabetic? Yes   RO/Hemodialysis Architectural technologist   Is Total Chlorine less than 0.1 ppm? Yes   Orignial Total Chlorine Testing Time 0920   At 4 Hour Total Chlorine Testing Time 1320   RO/Hemodialysis Facilities manager Number 7    Machine Serial Number   [id number])   RO # 27   RO Serial #   [id number])   pH 7   Pressure Test Verified Yes   Alarms Verified Passed   Machine Temperature 98.6 F (37 C)   Alarms Verified Yes   Na+ mEq (Machine) 138 mEq   Bicarb mEq (Machine) 35 mEq   Hemodialysis Conductivity (Machine) 13.8   Hemodialysis Conductivity (Meter) 13.8   Dialyzer Lot Number [id number]   Tubing Lot Number [id number]   RO Machine Log Completed Yes   Hepatitis Status   HBsAg (Antigen) Result Negative   HBsAg Date Drawn 06/12/19   HBsAg Repeat Draw Due Date 07/11/19   Dialysis Weight   Pre-Treatment Weight (Kg)   (UTW, bed scale not working. MD will notify)   Vitals   Temp 97.9 F (36.6 C)   Heart Rate 75   Resp Rate 18   BP 127/68   SpO2 98 %   O2 Device None (Room air)   Assessment   Mental Status Alert;Oriented   Respiratory Pattern Regular   Bilateral Breath Sounds Clear   R Breath Sounds Clear   L Breath Sounds Clear   RLE  Edema +1   LLE Edema +1   General Skin Color Appropriate for ethnicity   Skin Condition/Temp Warm;Dry   Abdomen Inspection Soft   GI Symptoms None   Mobility Bed   Temporary Catheter - Non-Tunneled 05/23/19 Right   Placement Date/Time: 05/23/19 1352   Orientation: Right   Line necessity reviewed? Apheresis/hemodialysis   Catheter Lumen Volume Venous 1.7 mL   Catheter Lumen Volume Arterial 1.7 mL   Dressing Status and Intervention Dressing Intact;Clean & Dry   Tego/Curos Caps on Catheter Yes   Dressing Status Clean;Dry;Intact   Dressing Change Due 06/30/19   Pain Assessment   Charting Type Assessment   Pain Scale Used Numeric Scale (0-10)   Numeric Pain Scale   Pain Score 0   POSS Score 1   Hemodialysis Comments   Pre-Hemodialysis Comments Timeout and safety checks done

## 2019-06-26 NOTE — UM Notes (Signed)
Auth# [id number]  Reconsideration Clinical 05/31/19    Physician Advisor Recommendation:  Inpatient appropriate for peritonitis from PD catheter with active infection/growth on Cx, need for catheter removal and ex lap, as well as transition to HD.    Barkley Boards, MD        Temperature: Temp  Min: 97.3 F (36.3 C)  Max: 98.8 F (37.1 C)  Pulse: Pulse  Min: 63  Max: 77  Respiratory: Resp  Min: 14  Max: 18  Non-Invasive BP: BP  Min: 98/60  Max: 186/79  Pulse Oximetry SpO2  Min: 93 %  Max: 96 %        05/31/19 Medicine Assessment/Plan:  Generalized abdominal pain (05/27/2019)   Peritoneal dialysis catheter dysfunction, initial encounter (05/12/2019)   Peritonitis (05/24/2019)    Assessment: PD catheter removed by general surgery with exploratory laparotomy; pleural fluid growing Pseudomonas and E. Coli.  ID has been following.    Plan: Appreciate ID inputs  Continue Zosyn for now  We will switch to oral Cipro prior to discharge to skilled nursing facility in 1-2 days   Incentive spirometry encouraged  Pain regimen enhanced to optimize pain control     ESRD (end stage renal disease) on dialysis (05/24/2019)    Assessment: Patient was on PD prior to admission; she has been switched to hemodialysis for now; outpatient HD has been arranged for    Plan: Appreciate nephrology inputs    Outpatient dialysis to continue on a Tuesday Thursday Saturday schedule     Thrombocytopenia (05/31/2019)    Assessment: Could be due to the acute infection as above; platelet counts are stable    Plan: Okay to continue heparin for DVT prophylaxis  Monitor daily CBC     Hypotension (05/31/2019)    Assessment: Likely due to the infection above    Plan: Antibiotics as above    Analgesia: Increase Tylenol to 1 g every 6 hours; started gabapentin 300 mg every 8 hours; continue oxycodone 5 to 10 mg every 6 hours as needed    DISPO:  pain control needs to be optimized    05/29/2019 13:02  Hemoglobin: 7.6   Hematocrit: 26.1   Platelet Count: 116      05/30/2019 03:11  Hemoglobin: 7.8   Hematocrit: 27.2   Platelet Count: 125     05/29/2019 13:02  BUN: 29.0   Creatinine: 5.0       05/30/2019 03:11  BUN: 20.0   Creatinine: 4.3     05/29/2019 13:02  Calcium: 6.9     05/30/2019 03:11  Calcium: 7.0     05/29/2019 13:02  Albumin: 1.4   Protein Total: 4.2    05/30/2019 03:11  Albumin: 1.4   Protein Total: 4.7       Current Facility-Administered Medications   Medication Dose Route Frequency    acetaminophen  1,000 mg Oral Q8H    amLODIPine  5 mg Oral Q12H    atorvastatin  40 mg Oral Daily    carvedilol  25 mg Oral Q12H [place]    darbepoetin alfa  60 mcg Subcutaneous Weekly    docusate sodium  100 mg Oral Daily    heparin (porcine)  5,000 Units Subcutaneous Q12H [place]    insulin glargine  10 Units Subcutaneous QAM    insulin lispro  1-8 Units Subcutaneous TID AC    lactobacillus/streptococcus  1 capsule Oral Daily    pantoprazole  40 mg Oral QAM AC    piperacillin-tazobactam  2.25 g Intravenous  Q8H    sevelamer  800 mg Oral TID MEALS    vancomycin  125 mg Oral Q6H     morphine injection 2 mg   Dose: 2 mg  Freq: Every 4 hours PRN Route: IV x 2 doses 5/8 at 0526 and 2047    oxyCODONE (ROXICODONE) immediate release tablet 5 mg   Dose: 5 mg  Freq: Every 6 hours PRN Route: Pox 1 dose 5/8 at 0829

## 2019-06-26 NOTE — UM Notes (Signed)
[facility] Utilization Review  NPI: [id number], Tax ID: [id number]  Please Call Krystal Eaton, MSN, RN @ [phone] with any questions or concerns. Confidential voicemail.  Email: [email or web address]   Fax final authorizations and requests for additional information to [phone]      06/26/19 Continued Stay Review     [name] is a 64 y.o. female admitted with Right sided numbness    Interval Summary:     Patient Active Hospital Problem List:    Right arm pain/edema- triquetral fracture, superficial VT, possible pseudogout  Acute superficial venous thrombosis s/p dialysis catheter  Though superficial, started eliquis due to significant pain and swelling  Will aim to stop this at discharge esp since starting steroids  Did have recent GI bleed due to gastritis and was cleared for aspirin/pletal at that time  Has not had recurrent bleed, will monitor closely on eliquis  Discussed with [facility], states that clot due to peripheral IV not to tunneled cath and no further intervention from their standpoint.  xrays showed triquetral fracture, appreciate surgery consult  Will start treatment for pseudogout with steroids-- with caution given on Eliquis + recent gastritis and GI bleed- rapid taper if allows    abd post op wound- nonhealing, purulent  Appreciate wound care orders  Discussed with surgery, consulted-- recommended local wound care    ESRD on HD  Recent peritonitis pseudomonas/Ecoli  S/p dialysis   Removal of PD cath recently by Dr Baird Cancer    Recent upper GI bleed due to gastritis  Continue PPI BID, monitor H/H    HTN/hyperlipidemia  Amlodipine, atorvastatin    COPD    DM2 with hyperglycemia  bs 140-250  Continue insulin          DVT Prophylaxis:eliquis      SUBJECTIVE     Alphia L Cissell states right arm swollen painful.     Temp:  [97.9 F (36.6 C)-99.1 F (37.3 C)] 98 F (36.7 C)  Heart Rate:  [66-82] 80  Resp Rate:  [16-20] 19  BP: (108-161)/(57-90) 135/74     Lab  Results last 48 Hours     Procedure Component Value Units Date/Time    Glucose Whole Blood - POCT [[id number]]  (Abnormal) Collected: 06/26/19 1536     Updated: 06/26/19 1553     Whole Blood Glucose POCT 256 mg/dL     PTH, Intact [[id number]]  (Abnormal) Collected: 06/26/19 0428    Specimen: Blood Updated: 06/26/19 1349     PTH Intact 255.6 pg/mL     Glucose Whole Blood - POCT [[id number]]  (Abnormal) Collected: 06/26/19 1132     Updated: 06/26/19 1151     Whole Blood Glucose POCT 167 mg/dL     Hemolysis index [[id number]] Collected: 06/26/19 0428     Updated: 06/26/19 0915     Hemolysis Index 2    GFR [[id number]] Collected: 06/26/19 0428     Updated: 06/26/19 0915     EGFR 82.6    Basic Metabolic Panel [[id number]]  (Abnormal) Collected: 06/26/19 0428    Specimen: Blood Updated: 06/26/19 0915     Glucose 201 mg/dL      BUN 34.0 mg/dL      Creatinine 4.8 mg/dL      Calcium 8.1 mg/dL     Phosphorus [[id number]] Collected: 06/26/19 0428    Specimen: Blood Updated: 06/26/19 0915     Phosphorus 3.8 mg/dL     Glucose Whole Blood - POCT [[id number]]  (Abnormal) Collected: 06/26/19 2924  Updated: 06/26/19 0756     Whole Blood Glucose POCT 298 mg/dL     Hemolysis index [[id number]] Collected: 06/26/19 0428     Updated: 06/26/19 0747     Hemolysis Index 4    IRON PROFILE [[id number]]  (Abnormal) Collected: 06/26/19 0428     Updated: 06/26/19 0747     Iron 38 ug/dL      UIBC 131 ug/dL      TIBC 169 ug/dL      Iron Saturation 22 %     CBC without differential [[id number]]  (Abnormal) Collected: 06/26/19 0428    Specimen: Blood Updated: 06/26/19 0457     WBC 6.65 x10 3/uL      Hgb 8.3 g/dL      Hematocrit 28.2 %      Platelets 140 x10 3/uL      RBC 2.96 x10 6/uL      MCV 95.3 fL      MCH 28.0 pg      MCHC 29.4 g/dL         Scheduled Meds:  Current Facility-Administered Medications  Medication Dose Route Frequency   amLODIPine  5 mg Oral Q12H   apixaban  5 mg Oral Q12H SCH   atorvastatin  40 mg Oral Daily   carvedilol  25 mg Oral  Q12H   collagenase   Topical Q24H   gabapentin  600 mg Oral TID   insulin glargine  10 Units Subcutaneous Q12H   insulin lispro  1-4 Units Subcutaneous QHS   insulin lispro  1-8 Units Subcutaneous TID AC   lactobacillus/streptococcus  1 capsule Oral Daily   oxyCODONE  5 mg Oral Q6H   pantoprazole  40 mg Oral BID   predniSONE  30 mg Oral QAM W/BREAKFAST   sevelamer  800 mg Oral TID MEALS   sodium hypochlorite   Irrigation Daily   vitamin C  500 mg Oral Daily   zinc sulfate  220 mg Oral Daily    PRN Meds:.acetaminophen **OR** acetaminophen, albuterol-ipratropium, Nursing communication: Adult Hypoglycemia Treatment Algorithm **AND** dextrose **AND** dextrose **AND** [DISCONTINUED] glucagon (rDNA), Nursing communication: Adult Hypoglycemia Treatment Algorithm **AND** dextrose **AND** dextrose **AND** glucagon (rDNA), hydrALAZINE, melatonin, naloxone, ondansetron **OR** ondansetron    Intake/Output Summary (Last 24 hours) at 06/26/2019 1638  Last data filed at 06/26/2019 1325      Gross per 24 hour   Intake    Output 2500 ml   Net -2500 ml     Per Nephrology note:  Assessment:     1. CKD- on HD TTS  2. Anemia- CKD and chronic disease; Hb 8.6. Low Fe in the past.  3. HTN  4.RUE pain/swelling- superficial vein thrombosis RUE.   5. Nonhealing surgical wound from infected PD catheter removal   6. Type 2 DM-uncontrolled  7. Hypocalcemia- low albumin; check Phos  8. Obesity      Recommendations:     Hemodialysis today per usual routine  Follow-up iron studies      Krystal Eaton, RN, MSN  Utilization Review Case Management  [phone] (F)

## 2019-06-26 NOTE — Progress Notes (Signed)
Pt is alert and oriented. Pt still c/o pain on her R hand.  Primary RN gave pain medication. Permacath access is optimally working post dialysis.  Hemodialysis ended and pt. able to finish 3.5 hrs tx.  Able to removed Net Fluid of 2.5L.  Reports were given to Primary nurse.       06/26/19 1325   Treatment Summary   Time Off Machine 1315   Duration of Treatment (Hours) 3.5   Treatment Type 1:1   Dialyzer Clearance Moderately streaked   Fluid Volume Off (mL) 2900   Prime Volume (mL) 200   Rinseback Volume (mL) 200   Fluid Given: Normal Saline (mL) 0   Fluid Given: PRBC  0 mL   Fluid Given: Albumin (mL) 0   Fluid Given: Other (mL) 0   Total Fluid Given 400   Hemodialysis Net Fluid Removed 2500   Post Treatment Assessment   Post-Treatment Weight (Kg) -2.5   Patient Response to Treatment tolerated   Additional Dialyzer Used 0   Temporary Catheter - Non-Tunneled 05/23/19 Right   Placement Date/Time: 05/23/19 1352   Orientation: Right   Line necessity reviewed? Apheresis/hemodialysis   Catheter Lumen Volume Venous 1.7 mL   Catheter Lumen Volume Arterial 1.7 mL   Dressing Status and Intervention Dressing Intact;Clean & Dry   Tego/Curos Caps on Catheter Yes   NEW Tego/Curos Caps placed (Date) 06/26/19   End Caps Free From Blood Yes   Line Care Connections checked and tightened   Dressing Type Transparent;Biopatch   Dressing Status Clean;Dry;Intact   Dressing/Line Intervention Caps changed   Line Used For Blood Draw No   Dressing Change Due 06/30/19   Vitals   Temp 98 F (36.7 C)   Heart Rate 68   Resp Rate 18   BP 131/64   SpO2 100 %   O2 Device None (Room air)   Assessment   Mental Status Alert;Oriented   Respiratory Pattern Regular   Bilateral Breath Sounds Clear   R Breath Sounds Clear   L Breath Sounds Clear   RLE Edema Non Pitting Edema   LLE Edema Non Pitting Edema   General Skin Color Appropriate for ethnicity   Skin Condition/Temp Warm;Dry   Abdomen Inspection Soft   GI Symptoms None   Mobility Bed   Pain  Assessment   Charting Type Assessment   Pain Scale Used Numeric Scale (0-10)   Numeric Pain Scale   Pain Score 10   POSS Score 1   Pain Location Hand   Pain Orientation Right   Education   Person taught Patient   Knowledge basis Substantial   Topics taught Procedure   Teaching Tools Explain   [name] Understanding   Bedside Nurse Communication   Name of bedside RN - post dialysis Anthony Sar, RN

## 2019-06-26 NOTE — UM Notes (Signed)
Auth# [id number]  Reconsideration Clinical 06/04/19      Temperature: Temp  Min: 97.3 F (36.3 C)  Max: 98.6 F (37 C)  Pulse: Pulse  Min: 68  Max: 79  Respiratory: Resp  Min: 15  Max: 17  Non-Invasive BP: BP  Min: 98/63  Max: 135/58  Pulse Oximetry SpO2  Min: 94 %  Max: 100 %      06/04/19 Medicine Assessment  Generalized abdominal pain (05/27/2019)   Peritoneal dialysis catheter dysfunction, initial encounter (05/12/2019)   Peritonitis (05/24/2019)    Assessment: PD catheter removed by general surgery with exploratory laparotomy on 05/28/2019. Pleural fluid growing Pseudomonas and E. Coli.  ID has been following.    Plan: Appreciate ID inputs  Switched from Zosyn to oral Cipro prior to discharge to skilled nursing facility   Oral vancomycin 125 mg every 6 hours has been continued on account of loose stools  incentive spirometry use encouraged   Per ID p.o. Cipro and p.o. vancomycin to continue to 5/19.  Stopped IV morphine  Additional pain regimen as below.     Bowel incontinence   Assessment-unclear if this is a complication of prior back injury; however this problem is chronic and has been going on for the past year  Plan-minimize risk for bedsores with frequent turning and quick response to any accidents     ESRD (end stage renal disease) on dialysis (05/24/2019)    Assessment: Patient was on PD prior to admission; she has been switched to hemodialysis for now; outpatient HD has been arranged for    Plan: Appreciate nephrology inputs    Outpatient dialysis to continue on a Tuesday Thursday Saturday schedule     Thrombocytopenia (05/31/2019)    Assessment: Could be due to the acute infection as above; platelet counts are stable    Plan: Okay to continue heparin for DVT prophylaxis  Monitor daily CBC     Hypotension (05/31/2019)    Assessment: Likely due to the infection above    Plan: Antibiotics as above       Analgesia: continue Tylenol 1 g every 6 hours; reduced dose of gabapentin to 300 mg every night; continue  oxycodone 5 to 10 mg every 6 hours as needed; stopped IV morphine    06/04/2019 05:36  WBC: 9.82   Hemoglobin: 7.6      06/04/2019 05:36  Glucose: 263   BUN: 23.0   Creatinine: 5.1     06/04/2019 05:36  Calcium: 7.3       06/03/19 Surgery Note:  Patient POD 7 Ex lap and PD catheter removed.  Patient not seen.  Currently in hemodialysis.    Staples need to come out POD #14          Current Facility-Administered Medications   Medication Dose Route Frequency    acetaminophen  1,000 mg Oral 4 times per day    amLODIPine  5 mg Oral Q12H    atorvastatin  40 mg Oral Daily    carvedilol  25 mg Oral Q12H SCH    darbepoetin alfa  60 mcg Subcutaneous Weekly    docusate sodium  100 mg Oral Daily    gabapentin  300 mg Oral Daily    heparin (porcine)  5,000 Units Subcutaneous Q12H [facility]    insulin lispro  1-8 Units Subcutaneous TID AC    lactobacillus/streptococcus  1 capsule Oral Daily    levoFLOXacin  250 mg Oral Q48H    pantoprazole  40 mg Oral QAM AC  sevelamer  800 mg Oral TID MEALS    vancomycin  125 mg Oral Q6H    zinc Oxide   Topical Q6H     oxyCODONE (ROXICODONE) immediate release tablet 5 mg   Dose: 5 mg  Freq: Every 6 hours PRN Route: PO x 1 dose 5/12 at 1917

## 2019-06-27 LAB — CBC
Absolute NRBC: 0 10*3/uL (ref 0.00–0.00)
Hematocrit: 30.6 % — ABNORMAL LOW (ref 34.7–43.7)
Hgb: 9.1 g/dL — ABNORMAL LOW (ref 11.4–14.8)
MCH: 28.3 pg (ref 25.1–33.5)
MCHC: 29.7 g/dL — ABNORMAL LOW (ref 31.5–35.8)
MCV: 95 fL (ref 78.0–96.0)
MPV: 12 fL (ref 8.9–12.5)
Nucleated RBC: 0 /100 WBC (ref 0.0–0.0)
Platelets: 145 10*3/uL (ref 142–346)
RBC: 3.22 10*6/uL — ABNORMAL LOW (ref 3.90–5.10)
RDW: 13 % (ref 11–15)
WBC: 6.7 10*3/uL (ref 3.10–9.50)

## 2019-06-27 LAB — GLUCOSE WHOLE BLOOD - POCT
Whole Blood Glucose POCT: 460 mg/dL — ABNORMAL HIGH (ref 70–100)
Whole Blood Glucose POCT: 323 mg/dL — ABNORMAL HIGH (ref 70–100)
Whole Blood Glucose POCT: 289 mg/dL — ABNORMAL HIGH (ref 70–100)
Whole Blood Glucose POCT: 587 mg/dL (ref 70–100)

## 2019-06-27 LAB — C-REACTIVE PROTEIN: C-Reactive Protein: 7.2 mg/dL — ABNORMAL HIGH (ref 0.0–0.8)

## 2019-06-27 MED ORDER — HEPARIN SODIUM (PORCINE) 5000 UNIT/ML IJ SOLN
5000.00 [IU] | Freq: Three times a day (TID) | INTRAMUSCULAR | Status: DC
Start: 2019-06-27 — End: 2019-06-29
  Administered 2019-06-27 – 2019-06-29 (×6): 5000 [IU] via SUBCUTANEOUS
  Filled 2019-06-27 (×6): qty 1

## 2019-06-27 MED ORDER — PREDNISONE 20 MG PO TABS
20.0000 mg | ORAL_TABLET | Freq: Every morning | ORAL | Status: DC
Start: 2019-06-27 — End: 2019-06-28
  Administered 2019-06-27: 09:00:00 20 mg via ORAL
  Filled 2019-06-27: qty 1

## 2019-06-27 MED ORDER — INSULIN GLARGINE 100 UNIT/ML SC SOLN
22.00 [IU] | Freq: Two times a day (BID) | SUBCUTANEOUS | Status: DC
Start: 2019-06-27 — End: 2019-06-27
  Administered 2019-06-27: 09:00:00 22 [IU] via SUBCUTANEOUS
  Filled 2019-06-27: qty 22

## 2019-06-27 MED ORDER — INSULIN GLARGINE 100 UNIT/ML SC SOLN
26.00 [IU] | Freq: Two times a day (BID) | SUBCUTANEOUS | Status: DC
Start: 2019-06-27 — End: 2019-06-28
  Administered 2019-06-27: 22:00:00 26 [IU] via SUBCUTANEOUS
  Filled 2019-06-27: qty 26

## 2019-06-27 NOTE — UM Notes (Addendum)
[facility] Utilization Review   NPI #[id number], Tax ID [id number]  Please call Milas Gain MSN RN CCM @ [phone] with any questions or concerns.  Email:  [email or web address]  Fax final authorization and requests for additional information to [phone]    CONCURRENT REVIEW FOR: 06/27/19    LOC: OBSERVATION    PATIENT NAME: [name],[name] / [birth year] / AGE: 64 y.o.      S/I: BS UNCONTROLLED; TAPERING STEROIDS     V/S:    Vitals:    06/27/19 1229   BP: 130/75   Pulse: 67   Resp: 18   Temp: 98.1 F (36.7 C)   SpO2: 96%          Temperature: Temp  Min: 98 F (36.7 C)  Max: 98.2 F (36.8 C)  Pulse: Pulse  Min: 67  Max: 88  Respiratory: Resp  Min: 16  Max: 19  Non-Invasive BP: BP  Min: 121/72  Max: 149/85  Pulse Oximetry SpO2  Min: 95 %  Max: 99 %      Labs -  Lab 06/27/19  0501   RBC 3.22*   Hgb 9.1*   Hematocrit 30.6*     Lab 06/26/19  0428   BUN 34.0*   Creatinine 4.8*   Glucose 201*   Calcium 8.1*         MD NOTES:  MEDICINE NOTES  SUBJECTIVE  Leone Payor Bessinger states right arm finally starting to improve. In splint. Still has pain but better. No nausea/vomiting cough sob abd pain  A&P  Patient Active Hospital Problem List:  Right arm pain/edema- triquetral fracture, superficial VT, possible pseudogout  Acute superficial venous thrombosis s/p dialysis catheter  Though superficial, started eliquis due to significant pain and swelling  Will stop this now that she has responded to prednisone for gout  Discussed with CVIR, states that clot due to peripheral IV not to tunneled cath and no further intervention from their standpoint.  xrays showed triquetral fracture, appreciate surgery consult- not likely source of pain/swelling  started treatment for gout/pseudogout with steroids-- will need rapid taper since recent GI bleed and gastritis  Decreased prednisone today    abd post op wound- nonhealing, purulent  Appreciate wound care orders  Discussed with surgery, consulted--  recommended local wound care    ESRD on HD  Recent peritonitis pseudomonas/Ecoli  S/p dialysis   Removal of PD cath recently by Dr Baird Cancer    Recent upper GI bleed due to gastritis  Continue PPI BID, monitor H/H    HTN/hyperlipidemia  Amlodipine, atorvastatin    COPD    DM2 with hyperglycemia- uncontrolled cannot be discharged home today  bs >400 on steroids  Increased insulin  Decreased steroids    DVT Prophylaxis:eliquis  DISPO: pending improvement in blood sugar    NEPHROLOGY NOTES  Assessment:     1. CKD- on HD TTS  2. Anemia- CKD and chronic disease; Hb 8.6. Low Fe in the past.  3. HTN  4.RUE pain/swelling- superficial vein thrombosis RUE.   5. Nonhealing surgical wound from infected PD catheter removal   6. Type 2 DM-uncontrolled  7. Hypocalcemia- low albumin; check Phos  8. Obesity    PLAN  HD tomorrow morning in anticipation to discharge       Current Medications:   Scheduled Meds:  Current Facility-Administered Medications   Medication Dose Route Frequency    amLODIPine  5 mg Oral Q12H    atorvastatin  40 mg Oral Daily  carvedilol  25 mg Oral Q12H    collagenase   Topical Q24H    gabapentin  600 mg Oral TID    heparin (porcine)  5,000 Units Subcutaneous Q8H [place]    insulin glargine  22 Units Subcutaneous Q12H    insulin lispro  1-4 Units Subcutaneous QHS    insulin lispro  1-8 Units Subcutaneous TID AC    lactobacillus/streptococcus  1 capsule Oral Daily    oxyCODONE  5 mg Oral Q6H    pantoprazole  40 mg Oral BID    predniSONE  20 mg Oral QAM W/BREAKFAST    sevelamer  800 mg Oral TID MEALS    sodium hypochlorite   Irrigation Daily    vitamin C  500 mg Oral Daily    zinc sulfate  220 mg Oral Daily     Continuous Infusions:  PRN Meds:.acetaminophen **OR** acetaminophen, albuterol-ipratropium, Nursing communication: Adult Hypoglycemia Treatment Algorithm **AND** dextrose **AND** dextrose **AND** [DISCONTINUED] glucagon (rDNA), Nursing communication: Adult Hypoglycemia Treatment  Algorithm **AND** dextrose **AND** dextrose **AND** glucagon (rDNA), hydrALAZINE, melatonin, naloxone, ondansetron **OR** ondansetron

## 2019-06-27 NOTE — Progress Notes (Signed)
Alert and oriented x4. Patient reports hand pain getting better since implementation of hand splint and ice packs and gabapentin. Abdominal wound was cleansed and new dressing was applied. Patient slept well throughout night.

## 2019-06-27 NOTE — Plan of Care (Signed)
Problem: Safety  Goal: Patient will be free from injury during hospitalization  Outcome: Progressing  Flowsheets (Taken 06/27/2019 1306)  Patient will be free from injury during hospitalization:   Assess patient's risk for falls and implement fall prevention plan of care per policy   Hourly rounding   Provide and maintain safe environment     Problem: Pain  Goal: Pain at adequate level as identified by patient  Outcome: Progressing  Flowsheets (Taken 06/27/2019 1306)  Pain at adequate level as identified by patient:   Identify patient comfort function goal   Assess pain on admission, during daily assessment and/or before any "as needed" intervention(s)   Reassess pain within 30-60 minutes of any procedure/intervention, per Pain Assessment, Intervention, Reassessment (AIR) Cycle   Offer non-pharmacological pain management interventions     Problem: Compromised Tissue integrity  Goal: Damaged tissue is healing and protected  Outcome: Progressing  Flowsheets (Taken 06/27/2019 1306)  Damaged tissue is healing and protected:   Monitor/assess Braden scale every shift   Provide wound care per wound care algorithm   Increase activity as tolerated/progressive mobility     Problem: Renal Instability  Goal: Fluid and electrolyte balance are achieved/maintained  Outcome: Progressing  Flowsheets (Taken 06/27/2019 1306)  Fluid and electrolyte balance are achieved/maintained:   Monitor intake and output every shift   Monitor/assess lab values and report abnormal values   Provide adequate hydration   Monitor daily weight   Medicated for pain. Right wrist brace in placed and ice applied. Blood sugar on the high side. Discussed with Dr Romana Juniper. Made adjustment with patient's medication. Patient ambulate to the bathroom with assistance. Will continue to monitor blood sugar, blood works and for any changes. Safe environment provided.

## 2019-06-27 NOTE — Progress Notes (Signed)
[facility] PROGRESS NOTE  Aaron Edelman, x [phone] [facility] Spectralink)      Date Time: 06/27/19 1:19 PM  Patient Name: [name]  Attending Physician: Dolphus Jenny, MD    CC: follow-up CKD, CHF    Assessment:     1. CKD- on HD TTS  2. Anemia- CKD and chronic disease; Hb 8.6. Low Fe in the past.  3. HTN  4.RUE pain/swelling- superficial vein thrombosis RUE.   5. Nonhealing surgical wound from infected PD catheter removal   6. Type 2 DM-uncontrolled  7. Hypocalcemia- low albumin; check Phos  8. Obesity      Recommendations:     HD tomorrow morning in anticipation to discharge     Case discussed with: pt, HD RN    Verl Blalock, MD  [facility]  [phone] (office)  X [phone] [facility] Spectra-Link)      Subjective: doing well today    Review of Systems:   Review of Systems - No SOB, CP, no edema    Physical Exam:     Vitals:    06/27/19 0446 06/27/19 0711 06/27/19 0857 06/27/19 1229   BP: 149/85 130/80 123/73 130/75   Pulse: 69 70  67   Resp: 16 18  18    Temp: 98 F (36.7 C) 98.1 F (36.7 C)  98.1 F (36.7 C)   TempSrc: Oral Oral  Oral   SpO2: 99% 98%  96%   Weight:       Height:           Intake and Output Summary (Last 24 hours) at Date Time    Intake/Output Summary (Last 24 hours) at 06/27/2019 1319  Last data filed at 06/27/2019 0500  Gross per 24 hour   Intake 900 ml   Output 3450 ml   Net -2550 ml       General: awake, alert, oriented x 3, no acute distress.  Cardiovascular: regular rate and rhythm  Lungs: clear to auscultation bilaterally, bilateral air entry, normal work of breathing  Abdomen: RLQ dressing, soft, non-tender, non-distended, normoactive bowel sounds  Extremities: + RUE edema,pain improving  Other: RIJ Permcath      Meds:      Scheduled Meds: PRN Meds:    amLODIPine, 5 mg, Oral, Q12H  apixaban, 5 mg, Oral, Q12H SCH  atorvastatin, 40 mg, Oral, Daily  carvedilol, 25 mg, Oral, Q12H  collagenase, , Topical, Q24H  gabapentin, 600 mg, Oral, TID  insulin glargine,  22 Units, Subcutaneous, Q12H  insulin lispro, 1-4 Units, Subcutaneous, QHS  insulin lispro, 1-8 Units, Subcutaneous, TID AC  lactobacillus/streptococcus, 1 capsule, Oral, Daily  oxyCODONE, 5 mg, Oral, Q6H  pantoprazole, 40 mg, Oral, BID  predniSONE, 20 mg, Oral, QAM W/BREAKFAST  sevelamer, 800 mg, Oral, TID MEALS  sodium hypochlorite, , Irrigation, Daily  vitamin C, 500 mg, Oral, Daily  zinc sulfate, 220 mg, Oral, Daily          Continuous Infusions:   acetaminophen, 650 mg, Q6H PRN   Or  acetaminophen, 650 mg, Q6H PRN  albuterol-ipratropium, 3 mL, Q6H PRN  dextrose, 15 g of glucose, PRN   And  dextrose, 12.5 g, PRN  dextrose, 15 g of glucose, PRN   And  dextrose, 12.5 g, PRN   And  glucagon (rDNA), 1 mg, PRN  hydrALAZINE, 25 mg, Q6H PRN  melatonin, 3 mg, QHS PRN  naloxone, 0.2 mg, PRN  ondansetron, 4 mg, Q6H PRN   Or  ondansetron, 4 mg,  Q6H PRN              Labs:     Recent Labs   Lab 06/27/19  0501 06/26/19  0428 06/25/19  0300   WBC 6.70 6.65 6.70   Hgb 9.1* 8.3* 8.6*   Hematocrit 30.6* 28.2* 29.5*   Platelets 145 140* 141*     Recent Labs   Lab 06/26/19  0428 06/25/19  0300 06/24/19  1811 06/24/19  0503 06/24/19  0503 06/23/19  1229 06/23/19  1229   Sodium 136 138 135*  More results in Results Review 139  More results in Results Review 138   Potassium 4.2 3.9 3.6  More results in Results Review 4.6  More results in Results Review 4.2   Chloride 100 100 98*  More results in Results Review 104  More results in Results Review 101   CO2 23 26 26   More results in Results Review 26  More results in Results Review 25   BUN 34.0* 20.0* 14.0  More results in Results Review 30.0*  More results in Results Review 28.0*   Creatinine 4.8* 3.6* 3.0*  More results in Results Review 5.6*  More results in Results Review 5.6*   Calcium 8.1* 8.2* 7.8*  More results in Results Review 7.8*  More results in Results Review 7.8*   Albumin  --   --   --   --  2.5*  --  2.6*   Phosphorus 3.8  --   --   --   --   --   --    Magnesium  --    --   --   --  1.9  --   --    Glucose 201* 174* 322*  More results in Results Review 269*  More results in Results Review 374*   EGFR 11.0 15.4 19.0  More results in Results Review 9.2  More results in Results Review 9.2   More results in Results Review = values in this interval not displayed.             Signed by: Verl Blalock, MD

## 2019-06-27 NOTE — Plan of Care (Signed)
Problem: Pain  Goal: Pain at adequate level as identified by patient  Outcome: Progressing  Flowsheets (Taken 06/27/2019 2226)  Pain at adequate level as identified by patient:   Identify patient comfort function goal   Reassess pain within 30-60 minutes of any procedure/intervention, per Pain Assessment, Intervention, Reassessment (AIR) Cycle   Evaluate if patient comfort function goal is met   Evaluate patient's satisfaction with pain management progress   Offer non-pharmacological pain management interventions     Problem: Compromised Tissue integrity  Goal: Damaged tissue is healing and protected  Outcome: Progressing  Flowsheets (Taken 06/27/2019 2226)  Damaged tissue is healing and protected:   Monitor/assess Braden scale every shift   Provide wound care per wound care algorithm   Increase activity as tolerated/progressive mobility     Problem: Moderate/High Fall Risk Score >5  Goal: Patient will remain free of falls  Outcome: Progressing  Flowsheets (Taken 06/26/2019 2036)  Moderate Risk (6-13):   LOW-Fall Interventions Appropriate for Low Fall Risk   MOD-Apply bed exit alarm if patient is confused   MOD-Floor mat at bedside (where available) if appropriate   MOD-Remain with patient during toileting     Problem: Compromised Hemodynamic Status  Goal: Vital signs and fluid balance maintained/improved  Outcome: Progressing  Flowsheets (Taken 06/26/2019 2054)  Vital signs and fluid balance are maintained/improved:   Position patient for maximum circulation/cardiac output   Monitor/assess vitals and hemodynamic parameters with position changes   Monitor/assess lab values and report abnormal values

## 2019-06-27 NOTE — Progress Notes (Signed)
[facility]  PROGRESS NOTE      Patient: [name]  Date: 06/27/2019   LOS: 0 Days  Admission Date: 06/23/2019   MRN: [id number]  Attending: Dolphus Jenny  Please contact me on the following epic chat       ASSESSMENT/PLAN     ALICE BURNSIDE is a 64 y.o. female admitted with Right sided numbness    Interval Summary:     Patient Active Hospital Problem List:    Right arm pain/edema- triquetral fracture, superficial VT, possible pseudogout  Acute superficial venous thrombosis s/p dialysis catheter  Though superficial, started eliquis due to significant pain and swelling  Will stop this now that she has responded to prednisone for gout  Discussed with CV[facility], states that clot due to peripheral IV not to tunneled cath and no further intervention from their standpoint.  xrays showed triquetral fracture, appreciate surgery consult- not likely source of pain/swelling  started treatment for gout/pseudogout with steroids-- will need rapid taper since recent GI bleed and gastritis  Decreased prednisone today    abd post op wound- nonhealing, purulent  Appreciate wound care orders  Discussed with surgery, consulted-- recommended local wound care    ESRD on HD  Recent peritonitis pseudomonas/Ecoli  S/p dialysis   Removal of PD cath recently by Dr Baird Cancer    Recent upper GI bleed due to gastritis  Continue PPI BID, monitor H/H    HTN/hyperlipidemia  Amlodipine, atorvastatin    COPD    DM2 with hyperglycemia- uncontrolled cannot be discharged home today  bs >400 on steroids  Increased insulin  Decreased steroids          DVT Prophylaxis:eliquis       Code Status: full    DISPO: pending improvement in blood sugar    Family Contact: none       SUBJECTIVE     Tony L Riepe states right arm finally starting to improve. In splint. Still has pain but better. No nausea/vomiting cough sob abd pain.    MEDICATIONS     Current Facility-Administered Medications   Medication Dose Route Frequency    amLODIPine  5 mg  Oral Q12H    apixaban  5 mg Oral Q12H SCH    atorvastatin  40 mg Oral Daily    carvedilol  25 mg Oral Q12H    collagenase   Topical Q24H    gabapentin  600 mg Oral TID    insulin glargine  22 Units Subcutaneous Q12H    insulin lispro  1-4 Units Subcutaneous QHS    insulin lispro  1-8 Units Subcutaneous TID AC    lactobacillus/streptococcus  1 capsule Oral Daily    oxyCODONE  5 mg Oral Q6H    pantoprazole  40 mg Oral BID    predniSONE  20 mg Oral QAM W/BREAKFAST    sevelamer  800 mg Oral TID MEALS    sodium hypochlorite   Irrigation Daily    vitamin C  500 mg Oral Daily    zinc sulfate  220 mg Oral Daily       PHYSICAL EXAM     Vitals:    06/27/19 1229   BP: 130/75   Pulse: 67   Resp: 18   Temp: 98.1 F (36.7 C)   SpO2: 96%       Temperature: Temp  Min: 98 F (36.7 C)  Max: 98.2 F (36.8 C)  Pulse: Pulse  Min: 67  Max: 88  Respiratory: Resp  Min: 16  Max: 19  Non-Invasive BP: BP  Min: 121/72  Max: 149/85  Pulse Oximetry SpO2  Min: 95 %  Max: 99 %    Intake and Output Summary (Last 24 hours) at Date Time    Intake/Output Summary (Last 24 hours) at 06/27/2019 1455  Last data filed at 06/27/2019 0500  Gross per 24 hour   Intake 900 ml   Output 950 ml   Net -50 ml         GEN APPEARANCE: Normal;  A&OX3  HEENT: PERLA; EOMI; Conjunctiva Clear  NECK: Supple; No bruits  CVS: RRR, S1, S2; No M/G/R  LUNGS: CTAB; No Wheezes; No Rhonchi: No rales  ABD: Soft; No TTP; + Normoactive BS  EXT: RUE tender, less edema  Skin exam:  no pallor- has open area of nonhealing from PD cath removal  NEURO: CN 2-12 intact; No Focal neurological deficits  CAP REFILL:  Normal  MENTAL STATUS:  Normal    Exam done by Dolphus Jenny, MD on 06/27/19 at 2:33 PM      LABS     Recent Labs   Lab 06/27/19  0501 06/26/19  0428 06/25/19  0300   WBC 6.70 6.65 6.70   RBC 3.22* 2.96* 3.07*   Hgb 9.1* 8.3* 8.6*   Hematocrit 30.6* 28.2* 29.5*   MCV 95.0 95.3 96.1*   Platelets 145 140* 141*       Recent Labs   Lab 06/26/19  0428 06/25/19  0300  06/24/19  1811 06/24/19  0503 06/23/19  1229   Sodium 136 138 135* 139 138   Potassium 4.2 3.9 3.6 4.6 4.2   Chloride 100 100 98* 104 101   CO2 23 26 26 26 25    BUN 34.0* 20.0* 14.0 30.0* 28.0*   Creatinine 4.8* 3.6* 3.0* 5.6* 5.6*   Glucose 201* 174* 322* 269* 374*   Calcium 8.1* 8.2* 7.8* 7.8* 7.8*   Magnesium  --   --   --  1.9  --        Recent Labs   Lab 06/24/19  0503 06/23/19  1229   ALT 10 8   AST (SGOT) 11 7   Bilirubin, Total 0.1* 0.2   Albumin 2.5* 2.6*   Alkaline Phosphatase 129* 132*       Recent Labs   Lab 06/24/19  1100 06/23/19  1229   Creatine Kinase (CK) 25*  --    Troponin I  --  0.02       Recent Labs   Lab 06/23/19  1229   PT INR 1.0   PT 11.9   PTT 30       Microbiology Results     Procedure Component Value Units Date/Time    COVID-19 (SARS-COV-2) [employer] Rapid) [[id number]] Collected: 06/23/19 1347    Specimen: Nasopharyngeal Swab from Nasopharynx Updated: 06/23/19 1414     Purpose of COVID testing Screening     SARS-CoV-2 Specimen Source Nasopharyngeal     SARS CoV 2 Overall Result Negative     Comment: Test performed using the Abbott ID NOW EUA assay.  Please see Fact Sheets for patients and providers located at:  http://olson-hall.info/  This test is for the qualitative detection of SARS-CoV-2  (COVID19) nucleic acid. Viral nucleic acids may persist in vivo,  independent of viability. Detection of viral nucleic acid does  not imply the presence of infectious virus, or that virus  nucleic acid is the cause of clinical symptoms. Negative  results should be treated as presumptive and, if inconsistent  with clinical signs and symptoms or necessary for patient  management, should be tested with an alternative molecular  assay. Negative results do not preclude SARS-CoV-2 infection  and should not be used as the sole basis for patient  management decisions. Invalid results may be due to inhibiting  substances in the specimen and recollection should occur.         Narrative:      o  Collect and clearly label specimen type:  o Upper respiratory specimen: One Nasopharyngeal Dry Swab NO  Transport Media.  o Hand deliver to laboratory ASAP  Indication for testing->Extended care facility admission to  semi private room           RADIOLOGY     Upon my review:    Rowan Blase  2:55 PM 06/27/2019

## 2019-06-28 DIAGNOSIS — I639 Cerebral infarction, unspecified: Secondary | ICD-10-CM | POA: Diagnosis present

## 2019-06-28 LAB — HEMOLYSIS INDEX: Hemolysis Index: 5 (ref 0–18)

## 2019-06-28 LAB — BASIC METABOLIC PANEL
Anion Gap: 15 (ref 5.0–15.0)
BUN: 55 mg/dL — ABNORMAL HIGH (ref 7.0–19.0)
CO2: 21 mEq/L — ABNORMAL LOW (ref 22–29)
Calcium: 8.2 mg/dL — ABNORMAL LOW (ref 8.5–10.5)
Chloride: 96 mEq/L — ABNORMAL LOW (ref 100–111)
Creatinine: 5.3 mg/dL — ABNORMAL HIGH (ref 0.6–1.0)
Glucose: 496 mg/dL — ABNORMAL HIGH (ref 70–100)
Potassium: 5.4 mEq/L — ABNORMAL HIGH (ref 3.5–5.1)
Sodium: 132 mEq/L — ABNORMAL LOW (ref 136–145)

## 2019-06-28 LAB — CBC
Absolute NRBC: 0 10*3/uL (ref 0.00–0.00)
Hematocrit: 30.9 % — ABNORMAL LOW (ref 34.7–43.7)
Hgb: 9.2 g/dL — ABNORMAL LOW (ref 11.4–14.8)
MCH: 28 pg (ref 25.1–33.5)
MCHC: 29.8 g/dL — ABNORMAL LOW (ref 31.5–35.8)
MCV: 94.2 fL (ref 78.0–96.0)
MPV: 12.3 fL (ref 8.9–12.5)
Nucleated RBC: 0 /100 WBC (ref 0.0–0.0)
Platelets: 184 10*3/uL (ref 142–346)
RBC: 3.28 10*6/uL — ABNORMAL LOW (ref 3.90–5.10)
RDW: 13 % (ref 11–15)
WBC: 11.33 10*3/uL — ABNORMAL HIGH (ref 3.10–9.50)

## 2019-06-28 LAB — GLUCOSE WHOLE BLOOD - POCT
Whole Blood Glucose POCT: 442 mg/dL — ABNORMAL HIGH (ref 70–100)
Whole Blood Glucose POCT: 222 mg/dL — ABNORMAL HIGH (ref 70–100)
Whole Blood Glucose POCT: 300 mg/dL — ABNORMAL HIGH (ref 70–100)
Whole Blood Glucose POCT: 341 mg/dL — ABNORMAL HIGH (ref 70–100)

## 2019-06-28 LAB — GFR: EGFR: 9.8

## 2019-06-28 MED ORDER — SODIUM CHLORIDE 0.9 % IV BOLUS
250.0000 mL | INTRAVENOUS | Status: AC | PRN
Start: 2019-06-28 — End: 2019-06-28

## 2019-06-28 MED ORDER — INSULIN GLARGINE 100 UNIT/ML SC SOLN
30.00 [IU] | Freq: Two times a day (BID) | SUBCUTANEOUS | Status: DC
Start: 2019-06-28 — End: 2019-06-28
  Administered 2019-06-28: 09:00:00 30 [IU] via SUBCUTANEOUS
  Filled 2019-06-28: qty 30

## 2019-06-28 MED ORDER — PREDNISONE 10 MG PO TABS
10.0000 mg | ORAL_TABLET | Freq: Every morning | ORAL | Status: DC
Start: 2019-06-28 — End: 2019-06-29
  Administered 2019-06-28: 09:00:00 10 mg via ORAL
  Filled 2019-06-28: qty 1

## 2019-06-28 MED ORDER — SODIUM CHLORIDE 0.9 % IV BOLUS
100.0000 mL | INTRAVENOUS | Status: AC | PRN
Start: 2019-06-28 — End: 2019-06-28

## 2019-06-28 MED ORDER — INSULIN GLARGINE 100 UNIT/ML SC SOLN
35.00 [IU] | Freq: Two times a day (BID) | SUBCUTANEOUS | Status: DC
Start: 2019-06-28 — End: 2019-06-29
  Administered 2019-06-28 – 2019-06-29 (×2): 35 [IU] via SUBCUTANEOUS
  Filled 2019-06-28 (×2): qty 35

## 2019-06-28 NOTE — Progress Notes (Signed)
Hand off report received from Prim RN; Pt. A/ox3; w/ rt arm brace on; No complaint of discomfort pre HD; Rt. CVC- working  Well.   06/28/19 0935   Bedside Nurse Communication   Name of bedside RN - pre dialysis Hewan Zerihun    Treatment Initiation- With Dialysis Precautions   Time Out/Safety Check Completed Yes   Consent for HD signed for this hospitalization (Date) 05/23/19   Consent for HD signed for this hospitalization (Time) 1044   Preferred language No   Blood Consent Verified N/A   Dialysis Precautions All Connections Secured;Saline Line Double Clamped   Dialysis Treatment Type Routine;Acute Room   Is patient diabetic? Yes   RO/Hemodialysis Architectural technologist   Is Total Chlorine less than 0.1 ppm? Yes   Orignial Total Chlorine Testing Time 0900   At 4 Hour Total Chlorine Testing Time 1300   RO/Hemodialysis Facilities manager Number 4    Machine Serial Number   [id number])   RO # 24   RO Serial # [id number]   Water Hardness NA   pH 7.2   Pressure Test Verified Yes   Alarms Verified Passed   Machine Temperature 97.7 F (36.5 C)   Alarms Verified Yes   Na+ mEq (Machine) 138 mEq   Bicarb mEq (Machine) 35 mEq   Hemodialysis Conductivity (Machine) 13.6   Hemodialysis Conductivity (Meter) 14   Dialyzer Lot Number [id number]   Tubing Lot Number [id number]   RO Machine Log Completed Yes   Hepatitis Status   HBsAg (Antigen) Result Negative   HBsAg Date Drawn 06/12/19   HBsAg Repeat Draw Due Date 07/11/19   Dialysis Weight   Pre-Treatment Weight (Kg) 109   Vitals   Temp 97.8 F (36.6 C)   Heart Rate 64   Resp Rate 16   BP 149/68   SpO2 99 %   O2 Device None (Room air)   Assessment   Mental Status Alert;Oriented;Cooperative   Cardiac (WDL) WDL   Cardiac Regularity Regular   Cardiac Symptoms None   Cardiac Rhythm Normal Sinus Rhythm   Respiratory  WDL   Respiratory Pattern Regular   Bilateral Breath Sounds Clear   R Breath Sounds Clear   L Breath Sounds Clear   Edema  WDL   Generalized Edema Non Pitting  Edema   RLE Edema Non Pitting Edema   LLE Edema Non Pitting Edema   General Skin Color Appropriate for ethnicity   Skin Condition/Temp Warm;Dry   Gastrointestinal (WDL) X   Abdomen Inspection Soft;Nondistended   GI Symptoms None   Mobility Bed   Pain Assessment   Charting Type Assessment   Pain Scale Used Numeric Scale (0-10)   Numeric Pain Scale   Pain Score 0   POSS Score 1   Pain Location Hand   Pain Orientation Right   Hemodialysis Comments   Pre-Hemodialysis Comments Timeout safety check- done   Hemodialysis History Information   Outpatient Dialysis Unit [place]   Chronic Dialysis Patient Yes   Outpatient Nephrologist Dr. Conley Canal

## 2019-06-28 NOTE — Plan of Care (Addendum)
Patient is alert x 4, VSS, denies pain at this time. Neuro check WNL. BS monitored sliding scale per order.  Off the floor to HD at this time.     Problem: Pain  Goal: Pain at adequate level as identified by patient  Outcome: Progressing  Flowsheets (Taken 06/28/2019 1324)  Pain at adequate level as identified by patient:   Identify patient comfort function goal   Assess for risk of opioid induced respiratory depression, including snoring/sleep apnea. Alert healthcare team of risk factors identified.   Assess pain on admission, during daily assessment and/or before any "as needed" intervention(s)   Reassess pain within 30-60 minutes of any procedure/intervention, per Pain Assessment, Intervention, Reassessment (AIR) Cycle     Problem: Every Day - Stroke  Goal: Neurological status is stable or improving  Outcome: Progressing  Flowsheets (Taken 06/28/2019 1324)  Neurological status is stable or improving: Monitor/assess/document neurological assessment (Stroke: every 4 hours)  Goal: Skin integrity is maintained or improved  Flowsheets (Taken 06/28/2019 1324)  Skin integrity is maintained or improved:   Assess Braden Scale every shift   Avoid shearing     Problem: Compromised Tissue integrity  Goal: Damaged tissue is healing and protected  Outcome: Progressing  Flowsheets (Taken 06/28/2019 1324)  Damaged tissue is healing and protected:   Monitor/assess Braden scale every shift   Provide wound care per wound care algorithm   Reposition patient every 2 hours and as needed unless able to reposition self   Increase activity as tolerated/progressive mobility     Problem: Compromised Hemodynamic Status  Goal: Vital signs and fluid balance maintained/improved  Outcome: Progressing  Flowsheets (Taken 06/26/2019 2054 by Silvestre Mesi, Jamelle Haring, RN)  Vital signs and fluid balance are maintained/improved:   Position patient for maximum circulation/cardiac output   Monitor/assess vitals and hemodynamic parameters with position  changes   Monitor/assess lab values and report abnormal values

## 2019-06-28 NOTE — Progress Notes (Signed)
PROGRESS NOTE    Date Time: 06/28/19 3:28 PM  Patient Name: DianeDiane Young      Assessment:   Right wrist pain, suspected pseudogout that is improving    Plan:   -The patient can weight-bear as tolerated, but recommend continuing the wrist splint/brace while she still has some discomfort.  She can discontinue the brace when she is pain-free  -Patient is completing a steroid course that is being tapered for treatment for suspected pseudogout      Subjective:   The patient is on day 3 of a steroid treatment that is currently being tapered for suspected pseudogout of the right wrist.  She feels much better and still has some pain but overall is much more tolerable.  No fevers or chills.    Medications:     Current Facility-Administered Medications   Medication Dose Route Frequency    amLODIPine  5 mg Oral Q12H    atorvastatin  40 mg Oral Daily    carvedilol  25 mg Oral Q12H    collagenase   Topical Q24H    gabapentin  600 mg Oral TID    heparin (porcine)  5,000 Units Subcutaneous Q8H [place]    insulin glargine  30 Units Subcutaneous Q12H    insulin lispro  1-4 Units Subcutaneous QHS    insulin lispro  1-8 Units Subcutaneous TID AC    lactobacillus/streptococcus  1 capsule Oral Daily    oxyCODONE  5 mg Oral Q6H    pantoprazole  40 mg Oral BID    predniSONE  10 mg Oral QAM W/BREAKFAST    sevelamer  800 mg Oral TID MEALS    sodium hypochlorite   Irrigation Daily    vitamin C  500 mg Oral Daily    zinc sulfate  220 mg Oral Daily       Review of Systems:   A comprehensive review of systems was: Negative except Right wrist pain    Physical Exam:     Vitals:    06/28/19 1337   BP: 129/60   Pulse: 62   Resp: 17   Temp: 97.2 F (36.2 C)   SpO2: 100%       Intake and Output Summary (Last 24 hours) at Date Time    Intake/Output Summary (Last 24 hours) at 06/28/2019 1528  Last data filed at 06/28/2019 1337  Gross per 24 hour   Intake 320 ml   Output 2100 ml   Net -1780 ml       General appearance - alert, well  appearing, and in no distress  Mental status - alert, oriented to person, place, and time  RUE:  Much decreased swelling in the hand wrist and fingers  Patient can now tolerate range of motion of about 40 degrees in each direction  Minimal tenderness to palpation of the wrist joint  Neurovascularly intact        Labs:     Results     Procedure Component Value Units Date/Time    Glucose Whole Blood - POCT [[id number]]  (Abnormal) Collected: 06/28/19 1359     Updated: 06/28/19 1402     Whole Blood Glucose POCT 222 mg/dL     Glucose Whole Blood - POCT [[id number]]  (Abnormal) Collected: 06/28/19 0730     Updated: 06/28/19 0823     Whole Blood Glucose POCT 442 mg/dL     Basic Metabolic Panel [[id number]]  (Abnormal) Collected: 06/28/19 0505    Specimen: Blood Updated: 06/28/19 0544  Glucose 496 mg/dL      BUN 55.0 mg/dL      Creatinine 5.3 mg/dL      Calcium 8.2 mg/dL      Sodium 132 mEq/Young      Potassium 5.4 mEq/Young      Chloride 96 mEq/Young      CO2 21 mEq/Young      Anion Gap 15.0    Hemolysis index [[id number]] Collected: 06/28/19 0505     Updated: 06/28/19 0544     Hemolysis Index 5    GFR [[id number]] Collected: 06/28/19 0505     Updated: 06/28/19 0544     EGFR 9.8    CBC without differential [[id number]]  (Abnormal) Collected: 06/28/19 0505    Specimen: Blood Updated: 06/28/19 0527     WBC 11.33 x10 3/uL      Hgb 9.2 g/dL      Hematocrit 30.9 %      Platelets 184 x10 3/uL      RBC 3.28 x10 6/uL      MCV 94.2 fL      MCH 28.0 pg      MCHC 29.8 g/dL      RDW 13 %      MPV 12.3 fL      Nucleated RBC 0.0 /100 WBC      Absolute NRBC 0.00 x10 3/uL     Glucose Whole Blood - POCT [[id number]]  (Abnormal) Collected: 06/27/19 2048     Updated: 06/27/19 2057     Whole Blood Glucose POCT 587 mg/dL               Rads:   Radiological Procedure reviewed.    Signed by: Karl Bales

## 2019-06-28 NOTE — Discharge Summary (Addendum)
[facility]      Patient: [name]  Admission Date: 06/23/2019   DOB: [birth year]  Discharge Date: 06/29/19    MRN: [id number]  Discharge Attending:Muhanad Torosyan E Priyal Musquiz     Referring Physician: Pcp, None, MD  PCP: Pcp, None, MD       DISCHARGE SUMMARY     Discharge Information   Admission Diagnosis:   Gout acute attack    Discharge Diagnosis:   Active Hospital Problems    Diagnosis    History of CVA (cerebrovascular accident)    Gout    Open wound, abdominal wall, lateral, subsequent encounter    Right sided numbness    Uncontrolled type 2 diabetes mellitus with hyperglycemia    Hypertension    Hyperlipidemia    History of COPD    Gastritis with hemorrhage    History of anemia due to chronic kidney disease    Type 2 diabetes mellitus, with long-term current use of insulin    ESRD (end stage renal disease) on dialysis        Admission Condition: fair  Discharge Condition: improved  Consultants: renal  Functional Status: baseline  Discharged to: home with services    Discharge Medications:     Medication List      START taking these medications    collagenase ointment  Commonly known as: SANTYL  Apply topically every 24 hours     sodium hypochlorite 0.125 % Soln  Commonly known as: DAKIN'S QUARTER STRENGTH  Irrigate with as directed daily  Start taking on: June 30, 2019     vitamin C 500 MG tablet  Commonly known as: ASCORBIC ACID  Take 1 tablet (500 mg total) by mouth daily  Start taking on: June 30, 2019     zinc sulfate 220 (50 Zn) MG capsule  Commonly known as: ZINCATE  Take 1 capsule (220 mg total) by mouth daily  Start taking on: June 30, 2019        CHANGE how you take these medications    insulin aspart 100 UNIT/ML injection  Commonly known as: NovoLOG  Inject 5 Units into the skin 3 (three) times daily before meals  What changed: how much to take     insulin glargine 100 UNIT/ML injection  Commonly known as: LANTUS  Inject 30 Units into the skin every 12 (twelve) hours  What changed:    how  much to take   when to take this        CONTINUE taking these medications    amLODIPine 5 MG tablet  Commonly known as: NORVASC  Take 1 tablet (5 mg total) by mouth every 12 (twelve) hours     atorvastatin 40 MG tablet  Commonly known as: LIPITOR     carvedilol 25 MG tablet  Commonly known as: COREG  Take 1 tablet (25 mg total) by mouth every 12 (twelve) hours     famotidine 40 MG tablet  Commonly known as: PEPCID     gabapentin 600 MG tablet  Commonly known as: NEURONTIN     lactobacillus/streptococcus Caps  Take 1 capsule by mouth daily     lidocaine 5 %  Commonly known as: LIDODERM  Place 1 patch onto the skin every 24 hours Remove & Discard patch within 12 hours or as directed by MD     oxyCODONE 5 MG immediate release tablet  Commonly known as: ROXICODONE  Take 1 tablet (5 mg total) by mouth every 6 (six) hours as needed for  Pain     pantoprazole 40 MG tablet  Commonly known as: PROTONIX  Take 1 tablet (40 mg total) by mouth 2 (two) times daily     sevelamer 800 MG tablet  Commonly known as: RENVELA     Trulicity 6.12 AE/4.9PN Sopn  Generic drug: Dulaglutide     zinc Oxide 40 % Pste paste  Commonly known as: DESITIN  Apply topically every 6 (six) hours           Where to Get Your Medications      These medications were sent to [facility], [address], [place] [address]    Phone: [phone]    collagenase ointment   insulin glargine 100 UNIT/ML injection   sodium hypochlorite 0.125 % Soln   vitamin C 500 MG tablet   zinc sulfate 220 (50 Zn) MG capsule     Information about where to get these medications is not yet available    Ask your nurse or doctor about these medications   insulin aspart 100 UNIT/ML injection             Hospital Course   Presentation History   [name] is a 64 y.o. female with a PMHx of hypertension, hyperlipidemia, insulin-dependent diabetes mellitus, COPD, ESRD currently on hemodialysis Tuesday  Thursday Saturday, peritoneal dialysis associated Pseudomonas/E. coli peritonitis, upper GI bleed secondary to gastritis on 05/13/2019 and anemia of chronic kidney disease who presented with right sided numbness and weakness.  Patient with recent hospitalization from 05/27/2019 to 06/13/2019 due to PD associated peritonitis with Pseudomonas and E. coli s/p starting of hemodialysis on 05/26/2019 and exploratory laparotomy, lysis of adhesion and removal of PD catheter on 05/27/2019.  Patient's last hemodialysis done on 06/21/2019 without any complications.     Patient reported right face, right upper extremity and right lower extremity numbness x3 days associated with weakness of right upper extremity and right hand pain.     Patient denied any fever chills nausea vomiting diarrhea chest pain shortness of breath headache lightheadedness or dizziness     During patient's initial evaluation in the emergency department, patient was found to have fairly stable vitals.  Unchanged laboratory data except for hyperglycemia with glucose of 374.  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    Hospital Course (0 Days)   Summary:  Patient presented with RUE severe pain and swelling. She had a duplex with concern for clot due to recent tunneled catheter which showed cephalic vein thrombus. CVIR was consulted but recommended no intervention. Initially I treated with eliquis, given the degree of symptoms, but imaged her wrist. The xrays showed a triquetral fracture. She was seen by ortho who felt that the degree of pain and swelling was out of proportion to fracture and recommended treatment empirically for gout/pseudogout. Patient improved on prednisone, so I have stopped eliquis as I think the cause of the pain was not the superficial thrombus. Her blood sugars became uncontrolled due to steroids, so insulin was quickly increased and prednisone  started to taper. Due to severe hyperglycemia, she remained in the hospital. Prednisone was tapered and insulin was increased. Patient admits her sugars at home are often 300-500 so I do not think her home lantus 10 units plus trulicity plus premeal Novolog is adequate  esp as she likely liberalizes her diet at home. We discussed the increase in lantus, will decrease the premeal to what she is on here, but she needs to record premeal and prebed blood sugar values and follow up with PCP.     By problem:  DM2 with hyperglycemia- uncontrolled and labile  Blood sugar improved in last 24 hours, was 200-400 yesterday, now 100-200 on increased Lantus doses and premeal insulin  Stopped steroids     Right arm pain/edema- triquetral fracture, superficial VT, suspected pseudogout  Acute superficial venous thrombosis s/p dialysis catheter  Though superficial, started eliquis due to significant pain and swelling  Stopped now that she has responded to prednisone for gout  Discussed with CVIR, states that clot due to peripheral IV not to tunneled cath and no further intervention from their standpoint.  Xrays showed triquetral fracture, appreciate surgery consult- not likely source of pain/swelling  Treated for gout/pseudogout with steroids- rapidly tapered due to hyperglylcemia, recent GI bleed on eliquis        abd post op wound- nonhealing, purulent  Appreciate wound care orders  Discussed with surgery, consulted-- recommended local wound care     ESRD on HD  Recent peritonitis pseudomonas/Ecoli  S/p dialysis   Removal of PD cath recently by Dr Baird Cancer     Recent upper GI bleed due to gastritis  Continue PPI BID, monitor H/H     HTN/hyperlipidemia  Amlodipine, atorvastatin     COPD    Obesity BMI 45         Procedures/Imaging:   Upon my review: XR Humerus Right AP Lateral    Result Date: 06/25/2019    Unremarkable right humerus. Alba Destine, MD  06/25/2019 7:26 PM    XR Wrist Right PA Lateral And Oblique    Result Date: 06/25/2019    Triquetral fracture. Alba Destine, MD  06/25/2019 7:28 PM    CT Head WO Contrast    Result Date: 06/23/2019   No intra-axial mass lesion or hemorrhage is demonstrated. Steward Drone, MD  06/23/2019 1:26 PM    MRI Brain WO Contrast    Result Date: 06/23/2019  No evidence of acute infarction, intracranial hemorrhage, mass effect or hydrocephalus. Gillermina Phy, MD  06/23/2019 8:04 PM    US Venous Up Extrem Duplex Dopp Uni Right    Result Date: 06/24/2019   Focal superficial vein thrombosis of the right cephalic vein in the antecubital fossa. No DVT. Graciella Freer, MD  06/24/2019 2:03 PM    CT Abdomen Pelvis W WO IV Contrast and W PO Contrast    Result Date: 06/09/2019   Small ill-defined collections of fluid anterior to the uterus and along the right posterolateral margin of the uterus may represent abscesses or loculated ascites in a patient with peritonitis Elyn Peers, MD  06/09/2019 9:13 AM         Best Practices   Was the patient admitted with either a CHF Exacerbation or Pneumonia? no     Progress Note/Physical Exam at Discharge     Subjective: Feels better. Moving hand better. No sob chest pain fever chills nausea/vomiting.    Vitals:    06/28/19 1300 06/28/19 1315 06/28/19 1326 06/28/19 1337   BP: 135/72 115/60 111/63 129/60   Pulse: 64 62 62 62   Resp: 18 18 18 17    Temp:   97.2 F (36.2 C) 97.2 F (36.2 C)   TempSrc:       SpO2: 99% 100% 98% 100%  Weight:       Height:             General: NAD, AAOx3  HEENT: perrla, eomi, sclera anicteric, OP: Clear, MMM  Neck: supple, FROM, no LAD  Cardiovascular: RRR, no m/r/g  Lungs: CTAB, no w/r/r  Abdomen: soft, +BS, NT/ND, no masses, no g/r  Extremities: no C/C/E  Skin: no rashes or lesions noted  Neuro: CN 2-12 intact; No Focal neurological deficits       Diagnostics     Labs/Studies Pending at Discharge: no    Last Labs   Recent Labs   Lab 06/28/19  0505 06/27/19  0501 06/26/19  0428   WBC 11.33* 6.70 6.65   RBC 3.28* 3.22* 2.96*   Hgb 9.2* 9.1* 8.3*   Hematocrit 30.9*  30.6* 28.2*   MCV 94.2 95.0 95.3   Platelets 184 145 140*       Recent Labs   Lab 06/28/19  0505 06/26/19  0428 06/25/19  0300 06/24/19  1811 06/24/19  0503   Sodium 132* 136 138 135* 139   Potassium 5.4* 4.2 3.9 3.6 4.6   Chloride 96* 100 100 98* 104   CO2 21* 23 26 26 26    BUN 55.0* 34.0* 20.0* 14.0 30.0*   Creatinine 5.3* 4.8* 3.6* 3.0* 5.6*   Glucose 496* 201* 174* 322* 269*   Calcium 8.2* 8.1* 8.2* 7.8* 7.8*   Magnesium  --   --   --   --  1.9       Microbiology Results     Procedure Component Value Units Date/Time    COVID-19 (SARS-COV-2) [employer]) [[id number]] Collected: 06/23/19 1347    Specimen: Nasopharyngeal Swab from Nasopharynx Updated: 06/23/19 1414     Purpose of COVID testing Screening     SARS-CoV-2 Specimen Source Nasopharyngeal     SARS CoV 2 Overall Result Negative     Comment: Test performed using the Abbott ID NOW EUA assay.  Please see Fact Sheets for patients and providers located at:  http://olson-hall.info/  This test is for the qualitative detection of SARS-CoV-2  (COVID19) nucleic acid. Viral nucleic acids may persist in vivo,  independent of viability. Detection of viral nucleic acid does  not imply the presence of infectious virus, or that virus  nucleic acid is the cause of clinical symptoms. Negative  results should be treated as presumptive and, if inconsistent  with clinical signs and symptoms or necessary for patient  management, should be tested with an alternative molecular  assay. Negative results do not preclude SARS-CoV-2 infection  and should not be used as the sole basis for patient  management decisions. Invalid results may be due to inhibiting  substances in the specimen and recollection should occur.         Narrative:      o Collect and clearly label specimen type:  o Upper respiratory specimen: One Nasopharyngeal Dry Swab NO  Transport Media.  o Hand deliver to laboratory ASAP  Indication for testing->Extended care facility admission to  semi private  room           Patient Instructions   Discharge Diet: diabetic cardiac  Discharge Activity: as tolerated    Follow Up Appointment:       Time spent examining patient, discussing with patient/family regarding hospital course, chart review, reconciling medications and discharge planning: 50 minutes.    Lavone Nian Kesa Birky    3:23 PM 06/28/2019

## 2019-06-28 NOTE — Progress Notes (Signed)
[facility] PROGRESS NOTE  Aaron Edelman, x [phone] [facility] Spectralink)      Date Time: 06/28/19 1:36 PM  Patient Name: [name]  Attending Physician: Dolphus Jenny, MD    CC: follow-up CKD, CHF  HD Note  Assessment:     1. CKD- on HD   2. Anemia- CKD and chronic disease  -stable  3. HTN -currently, contolled  4.RUE pain/swelling- superficial vein thrombosis RUE.   5. Nonhealing surgical wound from infected PD catheter removal       Recommendations:     1) Tolerating HD: Current Rx =  Dialysis Treatment Data:  Blood flow rate: 0 mL/min  Dialysate flow rate: 700 mL/min  Ultrafiltration rate: 0 mL/hr    Dialysate potassium: 2 mEq/L  Dialysate calcium: 2.5 mEq/L    Arterial pressure: -1 mmHg  Venous pressure: 31mmHg    2) HD MWF as OP at [place]  3) Epo at OP HD  4) Stable for [place] from renal standpoint    Case discussed with: pt, HD RN    Eilene Ghazi, MD  [facility]  [phone] (office)  X [phone] (FFX Spectra-Link)      Subjective: No new complaints. Seen on HD    Review of Systems:   Review of Systems - No SOB, nausea, pain    Physical Exam:     Vitals:    06/28/19 1245 06/28/19 1300 06/28/19 1315 06/28/19 1326   BP: 116/63 135/72 115/60 111/63   Pulse: 61 64 62 62   Resp: 17 18 18 18    Temp:    97.2 F (36.2 C)   TempSrc:       SpO2: 99% 99% 100% 98%   Weight:       Height:           Intake and Output Summary (Last 24 hours) at Date Time    Intake/Output Summary (Last 24 hours) at 06/28/2019 1336  Last data filed at 06/28/2019 0900  Gross per 24 hour   Intake 800 ml   Output --   Net 800 ml       General: awake, alert, no distress  Cardiovascular: regular rate and rhythm  Lungs: clear to auscultation bilaterally, bilateral air entry, normal work of breathing  Abdomen: RLQ dressing, soft, non-tender, non-distended, normoactive bowel sounds  Extremities: No LE edema      Meds:      Scheduled Meds: PRN Meds:    amLODIPine, 5 mg, Oral, Q12H  atorvastatin, 40 mg, Oral,  Daily  carvedilol, 25 mg, Oral, Q12H  collagenase, , Topical, Q24H  gabapentin, 600 mg, Oral, TID  heparin (porcine), 5,000 Units, Subcutaneous, Q8H [facility]  insulin glargine, 30 Units, Subcutaneous, Q12H  insulin lispro, 1-4 Units, Subcutaneous, QHS  insulin lispro, 1-8 Units, Subcutaneous, TID AC  lactobacillus/streptococcus, 1 capsule, Oral, Daily  oxyCODONE, 5 mg, Oral, Q6H  pantoprazole, 40 mg, Oral, BID  predniSONE, 10 mg, Oral, QAM W/BREAKFAST  sevelamer, 800 mg, Oral, TID MEALS  sodium hypochlorite, , Irrigation, Daily  vitamin C, 500 mg, Oral, Daily  zinc sulfate, 220 mg, Oral, Daily          Continuous Infusions:   acetaminophen, 650 mg, Q6H PRN   Or  acetaminophen, 650 mg, Q6H PRN  albuterol-ipratropium, 3 mL, Q6H PRN  dextrose, 15 g of glucose, PRN   And  dextrose, 12.5 g, PRN  dextrose, 15 g of glucose, PRN   And  dextrose, 12.5 g, PRN  And  glucagon (rDNA), 1 mg, PRN  hydrALAZINE, 25 mg, Q6H PRN  melatonin, 3 mg, QHS PRN  naloxone, 0.2 mg, PRN  ondansetron, 4 mg, Q6H PRN   Or  ondansetron, 4 mg, Q6H PRN  sodium chloride, 100 mL, Q1H PRN  sodium chloride, 250 mL, PRN              Labs:     Recent Labs   Lab 06/28/19  0505 06/27/19  0501 06/26/19  0428   WBC 11.33* 6.70 6.65   Hgb 9.2* 9.1* 8.3*   Hematocrit 30.9* 30.6* 28.2*   Platelets 184 145 140*     Recent Labs   Lab 06/28/19  0505 06/26/19  0428 06/25/19  0300 06/24/19  1811 06/24/19  0503 06/23/19  1229 06/23/19  1229   Sodium 132* 136 138  More results in Results Review 139  More results in Results Review 138   Potassium 5.4* 4.2 3.9  More results in Results Review 4.6  More results in Results Review 4.2   Chloride 96* 100 100  More results in Results Review 104  More results in Results Review 101   CO2 21* 23 26  More results in Results Review 26  More results in Results Review 25   BUN 55.0* 34.0* 20.0*  More results in Results Review 30.0*  More results in Results Review 28.0*   Creatinine 5.3* 4.8* 3.6*  More results in Results Review 5.6*  More  results in Results Review 5.6*   Calcium 8.2* 8.1* 8.2*  More results in Results Review 7.8*  More results in Results Review 7.8*   Albumin  --   --   --   --  2.5*  --  2.6*   Phosphorus  --  3.8  --   --   --   --   --    Magnesium  --   --   --   --  1.9  --   --    Glucose 496* 201* 174*  More results in Results Review 269*  More results in Results Review 374*   EGFR 9.8 11.0 15.4  More results in Results Review 9.2  More results in Results Review 9.2   More results in Results Review = values in this interval not displayed.             Signed by: Eilene Ghazi, MD

## 2019-06-28 NOTE — UM Notes (Signed)
Primary Coverage: MEDICAID HMO/UHC COMMUNITY PLAN [place] PLUS  Secondary Coverage: MEDICAID HMO/[employer] COMPLETE CARE OF [place]    CONCURRENT REVIEW June 28, 2019    Admit to Inpatient (Order [id number]) 06/28/19 1531  Diagnosis: Cerebrovascular Accident (Cva), Unspecified Mechanism    Level of Care: Acute    Patient Class: Inpatient       References: IAH Bed Placement [place] Bed Placement [place] Bed Placement [place] Bed Placement [place] Bed Placement Criteria   Question Answer Comment   Admitting Physician Dion Body E    Service: Medicine    Estimated Length of Stay > or = to 2 midnights    Tentative Discharge Plan? Home or Self Care    Does patient need telemetry? No           PATIENT NAME: [name],[name]  DOB: August 13, 1955    History of present illness: Pt is a 64 y.o. female arrived at [facility] on 06/23/2019 at 1131.and admitted to [place] is a 64 y.o. female admitted with Right sided numbness    BP 129/60    Pulse 62    Temp 97.2 F (36.2 C)    Resp 17    Ht 1.575 m (5\' 2" )    Wt 111.4 kg (245 lb 11.2 oz)    LMP  (LMP Unknown) Comment: post menopausal   SpO2 100%    BMI 44.94 kg/m     Temp:  [97.2 F (36.2 C)-98.2 F (36.8 C)]   Heart Rate:  [55-82]   Resp Rate:  [12-18]   BP: (111-152)/(60-85)   SpO2:  [95 %-100 %]       Abnormal Labs:   Lab Results last 48 Hours     Procedure Component Value Units Date/Time    Glucose Whole Blood - POCT [[id number]]  (Abnormal) Collected: 06/28/19 1359     Updated: 06/28/19 1402     Whole Blood Glucose POCT 222 mg/dL     Glucose Whole Blood - POCT [[id number]]  (Abnormal) Collected: 06/28/19 0730     Updated: 06/28/19 0823     Whole Blood Glucose POCT 442 mg/dL     Basic Metabolic Panel [[id number]]  (Abnormal) Collected: 06/28/19 0505    Specimen: Blood Updated: 06/28/19 0544     Glucose 496 mg/dL      BUN 55.0 mg/dL      Creatinine 5.3 mg/dL      Calcium 8.2 mg/dL      Sodium 132 mEq/Young      Potassium 5.4 mEq/Young       Chloride 96 mEq/Young      CO2 21 mEq/Young     GFR [[id number]] Collected: 06/28/19 0505     Updated: 06/28/19 0544     EGFR 9.8    CBC without differential [[id number]]  (Abnormal) Collected: 06/28/19 0505    Specimen: Blood Updated: 06/28/19 0527     WBC 11.33 x10 3/uL      Hgb 9.2 g/dL      Hematocrit 30.9 %      RBC 3.28 x10 6/uL      MCHC 29.8 g/dL     Glucose Whole Blood - POCT [[id number]]  (Abnormal) Collected: 06/27/19 2048     Updated: 06/27/19 2057     Whole Blood Glucose POCT 587 mg/dL     Glucose Whole Blood - POCT [[id number]]  (Abnormal) Collected: 06/27/19 1629     Updated: 06/27/19 1648     Whole Blood Glucose POCT 289 mg/dL  Glucose Whole Blood - POCT [[id number]]  (Abnormal) Collected: 06/27/19 1231     Updated: 06/27/19 1235     Whole Blood Glucose POCT 323 mg/dL     Glucose Whole Blood - POCT [[id number]]  (Abnormal) Collected: 06/27/19 0713     Updated: 06/27/19 0755     Whole Blood Glucose POCT 460 mg/dL     CBC without differential [[id number]]  (Abnormal) Collected: 06/27/19 0501    Specimen: Blood Updated: 06/27/19 0523     Hgb 9.1 g/dL      Hematocrit 30.6 %      RBC 3.22 x10 6/uL      MCHC 29.7 g/dL     C Reactive Protein [[id number]]  (Abnormal) Collected: 06/26/19 1700    Specimen: Blood Updated: 06/27/19 0237     C-Reactive Protein 7.2 mg/dL     Glucose Whole Blood - POCT [[id number]]  (Abnormal) Collected: 06/26/19 2012     Updated: 06/26/19 2014     Whole Blood Glucose POCT 333 mg/dL             MD Notes:  MD note 06/28/2019  Patient Active Hospital Problem List:  DM2 with hyperglycemia- uncontrolled and labile  bs 400-500 yesterday and today, then quick drop to 222 before lunch  Increased insulin again this AM  Decreased steroids again  Needs to have some stability in blood sugar prior to discharge to avoid HHNK vs hypoglycemia  Not safe to discharge and therefore will remain overnight unless next blood sugar stable in 200 range  Right arm pain/edema- triquetral fracture, superficial VT, possible  pseudogout  Acute superficial venous thrombosis s/p dialysis catheter  Though superficial, started eliquis due to significant pain and swelling  stopped now that she has responded to prednisone for gout  Discussed with CVIR, states that clot due to peripheral IV not to tunneled cath and no further intervention from their standpoint.  xrays showed triquetral fracture, appreciate surgery consult- not likely source of pain/swelling  started treatment for gout/pseudogout with steroids-- will need rapid taper since recent GI bleed and gastritis  Decreased prednisone today  abd post op wound- nonhealing, purulent  Appreciate wound care orders  Discussed with surgery, consulted-- recommended local wound care  ESRD on HD  Recent peritonitis pseudomonas/Ecoli  S/p dialysis   Removal of PD cath recently by Dr Baird Cancer  Recent upper GI bleed due to gastritis  Continue PPI BID, monitor H/H  HTN/hyperlipidemia  Amlodipine, atorvastatin  COPD  DVT Prophylaxis:eliquis  Code Status: full  DISPO: pending improvement in blood sugar    Nephrology note 06/28/2019  Assessment:  1. CKD- on HD   2. Anemia- CKD and chronic disease-stable  3. HTN -currently, contolled  4.RUE pain/swelling- superficial vein thrombosis RUE.   5. Nonhealing surgical wound from infected PD catheter removal   Recommendations:  1) Tolerating HD: Current Rx =  Dialysis Treatment Data:  Blood flow rate: 0 mL/min  Dialysate flow rate: 700 mL/min  Ultrafiltration rate: 0 mL/hr  Dialysate potassium: 2 mEq/Young  Dialysate calcium: 2.5 mEq/Young  Arterial pressure: -1 mmHg  Venous pressure: 17mmHg  2) HD MWF as OP at [place]  3) Epo at OP HD  4) Stable for [place] from renal standpoint  Case discussed with: pt, HD RN        Medications: Scheduled Meds:  Current Facility-Administered Medications   Medication Dose Route Frequency    amLODIPine  5 mg Oral Q12H    atorvastatin  40 mg Oral Daily  carvedilol  25 mg Oral Q12H    collagenase   Topical Q24H    gabapentin  600  mg Oral TID    heparin (porcine)  5,000 Units Subcutaneous Q8H [place]    insulin glargine  30 Units Subcutaneous Q12H    insulin lispro  1-4 Units Subcutaneous QHS    insulin lispro  1-8 Units Subcutaneous TID AC    lactobacillus/streptococcus  1 capsule Oral Daily    oxyCODONE  5 mg Oral Q6H    pantoprazole  40 mg Oral BID    predniSONE  10 mg Oral QAM W/BREAKFAST    sevelamer  800 mg Oral TID MEALS    sodium hypochlorite   Irrigation Daily    vitamin C  500 mg Oral Daily    zinc sulfate  220 mg Oral Daily     Continuous Infusions:  PRN Meds:.acetaminophen **OR** acetaminophen, albuterol-ipratropium, Nursing communication: Adult Hypoglycemia Treatment Algorithm **AND** dextrose **AND** dextrose **AND** [DISCONTINUED] glucagon (rDNA), Nursing communication: Adult Hypoglycemia Treatment Algorithm **AND** dextrose **AND** dextrose **AND** glucagon (rDNA), hydrALAZINE, melatonin, naloxone, ondansetron **OR** ondansetron      This clinical review is based on/compiled from documentation provided by the treatment team within the patient's medical record.    UTILIZATION REVIEW CONTACT : Shanon Payor BSN, RN, ACM  Utilization Review Case Manager II / Case Management  [facility]  [address]  [place], [place]  T [phone]  Jerilynn Mages [phone]   F [phone]    Email: [email or web address]  NPI: [id number] Tax ID:  [id number]

## 2019-06-28 NOTE — Plan of Care (Signed)
Problem: Renal Instability  Goal: Fluid and electrolyte balance are achieved/maintained  Flowsheets (Taken 06/28/2019 1602)  Fluid and electrolyte balance are achieved/maintained:   Monitor/assess lab values and report abnormal values   Monitor daily weight   Assess and reassess fluid and electrolyte status  Goal: Free from infection  Flowsheets (Taken 06/28/2019 1400)  Free from infection: Monitor/assess for signs and symptoms of infection     Problem: Patient Receiving Advanced Renal Therapies  Goal: Therapy access site remains intact  Flowsheets (Taken 06/28/2019 1602)  Therapy access site remains intact:   Assess therapy access site   Change therapy access site dressing as needed

## 2019-06-28 NOTE — Progress Notes (Addendum)
[facility]  PROGRESS NOTE      Patient: [name]  Date: 06/28/2019   LOS: 0 Days  Admission Date: 06/23/2019   MRN: [id number]  Attending: Dolphus Young  Please contact me on the following epic chat       ASSESSMENT/PLAN     [name] is a 64 y.o. female admitted with Right sided numbness    Interval Summary:     Patient Active Hospital Problem List:    DM2 with hyperglycemia- uncontrolled and labile  bs 400-500 yesterday and today, then quick drop to 222 before lunch  Increased insulin again this AM  Decreased steroids again  Needs to have some stability in blood sugar prior to discharge to avoid HHNK vs hypoglycemia  Not safe to discharge and therefore will remain overnight unless next blood sugar stable in 200 range  Patient is being upgraded to inpatient, based on severe hyperglycemia despite increasing basal insulin doses and decreasing prednisone    Right arm pain/edema- triquetral fracture, superficial VT, possible pseudogout  Acute superficial venous thrombosis s/p dialysis catheter  Though superficial, started eliquis due to significant pain and swelling  Stopped now that she has responded to prednisone for gout  Discussed with CVIR, states that clot due to peripheral IV not to tunneled cath and no further intervention from their standpoint.  xrays showed triquetral fracture, appreciate surgery consult- not likely source of pain/swelling  started treatment for gout/pseudogout with steroids-- will need rapid taper since recent GI bleed and gastritis  Decreased prednisone today    abd post op wound- nonhealing, purulent  Appreciate wound care orders  Discussed with surgery, consulted-- recommended local wound care    ESRD on HD  Recent peritonitis pseudomonas/Ecoli  S/p dialysis   Removal of PD cath recently by Dr Baird Cancer    Recent upper GI bleed due to gastritis  Continue PPI BID, monitor H/H    HTN/hyperlipidemia  Amlodipine, atorvastatin    COPD            DVT  Prophylaxis:eliquis       Code Status: full    DISPO: pending improvement in blood sugar    Family Contact: none       SUBJECTIVE     [name] states right arm pain improved. No sob chest pain fever chills. Wound dressing changes hurt.     MEDICATIONS     Current Facility-Administered Medications   Medication Dose Route Frequency    amLODIPine  5 mg Oral Q12H    atorvastatin  40 mg Oral Daily    carvedilol  25 mg Oral Q12H    collagenase   Topical Q24H    gabapentin  600 mg Oral TID    heparin (porcine)  5,000 Units Subcutaneous Q8H [place]    insulin glargine  30 Units Subcutaneous Q12H    insulin lispro  1-4 Units Subcutaneous QHS    insulin lispro  1-8 Units Subcutaneous TID AC    lactobacillus/streptococcus  1 capsule Oral Daily    oxyCODONE  5 mg Oral Q6H    pantoprazole  40 mg Oral BID    predniSONE  10 mg Oral QAM W/BREAKFAST    sevelamer  800 mg Oral TID MEALS    sodium hypochlorite   Irrigation Daily    vitamin C  500 mg Oral Daily    zinc sulfate  220 mg Oral Daily       PHYSICAL EXAM     Vitals:  06/28/19 1337   BP: 129/60   Pulse: 62   Resp: 17   Temp: 97.2 F (36.2 C)   SpO2: 100%       Temperature: Temp  Min: 97.2 F (36.2 C)  Max: 98.2 F (36.8 C)  Pulse: Pulse  Min: 55  Max: 82  Respiratory: Resp  Min: 12  Max: 18  Non-Invasive BP: BP  Min: 111/63  Max: 152/76  Pulse Oximetry SpO2  Min: 95 %  Max: 100 %    Intake and Output Summary (Last 24 hours) at Date Time    Intake/Output Summary (Last 24 hours) at 06/28/2019 1431  Last data filed at 06/28/2019 1337  Gross per 24 hour   Intake 320 ml   Output 2100 ml   Net -1780 ml         GEN APPEARANCE: Normal;  A&OX3  HEENT: PERLA; EOMI; Conjunctiva Clear  NECK: Supple; No bruits  CVS: RRR, S1, S2; No M/G/R  LUNGS: CTAB; No Wheezes; No Rhonchi: No rales  ABD: Soft; No TTP; + Normoactive BS  EXT: RUE less tender, less edema- improved ROM  Skin exam:  no pallor- has open area of nonhealing from PD cath removal  NEURO: CN 2-12 intact; No  Focal neurological deficits  CAP REFILL:  Normal  MENTAL STATUS:  Normal    Exam done by Diane Jenny, MD on 06/28/19 at 2:33 PM      LABS     Recent Labs   Lab 06/28/19  0505 06/27/19  0501 06/26/19  0428   WBC 11.33* 6.70 6.65   RBC 3.28* 3.22* 2.96*   Hgb 9.2* 9.1* 8.3*   Hematocrit 30.9* 30.6* 28.2*   MCV 94.2 95.0 95.3   Platelets 184 145 140*       Recent Labs   Lab 06/28/19  0505 06/26/19  0428 06/25/19  0300 06/24/19  1811 06/24/19  0503   Sodium 132* 136 138 135* 139   Potassium 5.4* 4.2 3.9 3.6 4.6   Chloride 96* 100 100 98* 104   CO2 21* 23 26 26 26    BUN 55.0* 34.0* 20.0* 14.0 30.0*   Creatinine 5.3* 4.8* 3.6* 3.0* 5.6*   Glucose 496* 201* 174* 322* 269*   Calcium 8.2* 8.1* 8.2* 7.8* 7.8*   Magnesium  --   --   --   --  1.9       Recent Labs   Lab 06/24/19  0503 06/23/19  1229   ALT 10 8   AST (SGOT) 11 7   Bilirubin, Total 0.1* 0.2   Albumin 2.5* 2.6*   Alkaline Phosphatase 129* 132*       Recent Labs   Lab 06/24/19  1100 06/23/19  1229   Creatine Kinase (CK) 25*  --    Troponin I  --  0.02       Recent Labs   Lab 06/23/19  1229   PT INR 1.0   PT 11.9   PTT 30       Microbiology Results     Procedure Component Value Units Date/Time    COVID-19 (SARS-COV-2) [place] [employer] Rapid) [[id number]] Collected: 06/23/19 1347    Specimen: Nasopharyngeal Swab from Nasopharynx Updated: 06/23/19 1414     Purpose of COVID testing Screening     SARS-CoV-2 Specimen Source Nasopharyngeal     SARS CoV 2 Overall Result Negative     Comment: Test performed using the Abbott ID NOW EUA assay.  Please see Fact Sheets  for patients and providers located at:  http://[facility].info/  This test is for the qualitative detection of SARS-CoV-2  (COVID19) nucleic acid. Viral nucleic acids may persist in vivo,  independent of viability. Detection of viral nucleic acid does  not imply the presence of infectious virus, or that virus  nucleic acid is the cause of clinical symptoms. Negative  results should be treated as  presumptive and, if inconsistent  with clinical signs and symptoms or necessary for patient  management, should be tested with an alternative molecular  assay. Negative results do not preclude SARS-CoV-2 infection  and should not be used as the sole basis for patient  management decisions. Invalid results may be due to inhibiting  substances in the specimen and recollection should occur.         Narrative:      o Collect and clearly label specimen type:  o Upper respiratory specimen: One Nasopharyngeal Dry Swab NO  Transport Media.  o Hand deliver to laboratory ASAP  Indication for testing->Extended care facility admission to  semi private room           RADIOLOGY     Upon my review:    Signed,  [name]  2:31 PM 06/28/2019

## 2019-06-28 NOTE — Progress Notes (Signed)
HD tx completed tolerated well ; Net fluid off 2.1L; Hemodynamically stable; Rt. CVC worked well; S/B Dr. Posey Pronto . Report given to Prim. RN.

## 2019-06-28 NOTE — Progress Notes (Signed)
Alert and oriented x4. Patient reported right hand pain improving and was able to move hand without complaints. Wrist for splint was kept on, elevation, and icing of wrist was also followed. Abdominal wound was cleansed and new dressing was applied. Slept well throughout night.

## 2019-06-29 DIAGNOSIS — M109 Gout, unspecified: Secondary | ICD-10-CM

## 2019-06-29 LAB — GLUCOSE WHOLE BLOOD - POCT
Whole Blood Glucose POCT: 170 mg/dL — ABNORMAL HIGH (ref 70–100)
Whole Blood Glucose POCT: 136 mg/dL — ABNORMAL HIGH (ref 70–100)
Whole Blood Glucose POCT: 141 mg/dL — ABNORMAL HIGH (ref 70–100)

## 2019-06-29 MED ORDER — INSULIN GLARGINE 100 UNIT/ML SC SOLN
30.00 [IU] | Freq: Two times a day (BID) | SUBCUTANEOUS | 0 refills | Status: DC
Start: 2019-06-29 — End: 2019-07-28

## 2019-06-29 MED ORDER — INSULIN LISPRO 100 UNIT/ML SC SOLN
5.00 [IU] | Freq: Three times a day (TID) | SUBCUTANEOUS | Status: DC
Start: 2019-06-29 — End: 2019-06-29
  Administered 2019-06-29 (×2): 5 [IU] via SUBCUTANEOUS
  Filled 2019-06-29: qty 15

## 2019-06-29 MED ORDER — INSULIN GLARGINE 100 UNIT/ML SC SOLN
35.00 [IU] | Freq: Two times a day (BID) | SUBCUTANEOUS | 0 refills | Status: DC
Start: 2019-06-29 — End: 2019-06-29

## 2019-06-29 MED ORDER — ZINC SULFATE 220 (50 ZN) MG PO CAPS
220.00 mg | ORAL_CAPSULE | Freq: Every day | ORAL | 0 refills | Status: DC
Start: 2019-06-30 — End: 2020-01-22

## 2019-06-29 MED ORDER — COLLAGENASE 250 UNIT/GM EX OINT
TOPICAL_OINTMENT | CUTANEOUS | 0 refills | Status: DC
Start: 2019-06-29 — End: 2020-01-06

## 2019-06-29 MED ORDER — ASCORBIC ACID 500 MG PO TABS
500.0000 mg | ORAL_TABLET | Freq: Every day | ORAL | 0 refills | Status: DC
Start: 2019-06-30 — End: 2021-01-07

## 2019-06-29 MED ORDER — INSULIN ASPART 100 UNIT/ML SC SOLN
5.00 [IU] | Freq: Three times a day (TID) | SUBCUTANEOUS | Status: DC
Start: 2019-06-29 — End: 2020-01-06

## 2019-06-29 MED ORDER — DAKINS (1/4 STRENGTH) 0.125 % EX SOLN
Freq: Every day | CUTANEOUS | 0 refills | Status: DC
Start: 2019-06-30 — End: 2019-07-28

## 2019-06-29 NOTE — Plan of Care (Signed)
PT is alert and stable, BS controlled. Cleared for discharge by attending. Discharge instructions, medications and follow up appointments discussed with patient, pt verbalized understanding teaching. IV and tele removed. D/C home via family transport.       Spoke with case manager regarding  follow up transitional care clinic appointment, per case management appt could not be set up since clinic is closed on weekends. Information to set up aptt on AVS, RN went over instructions with pt. Pt verbalized the importance of compliance.

## 2019-06-29 NOTE — Progress Notes (Signed)
Home Health Referral          Referral from Jessee Avers (Case Manager) for home health care upon discharge.    By [employer], the patient has the right to freely choose a home care provider.  Arrangements have been made with:     A company of the patients choosing. We have supplied the patient with a listing of providers in your area who asked to be included and participate in Medicare.   [facility], formerly [facility], a home care agency that provides adult home care services and participates in Medicare   The preferred provider of your insurance company. Choosing a home care provider other than your insurance company's preferred provider may affect your insurance coverage.      Home Health Discharge Information     Your doctor has ordered Skilled Nursing, Physical Therapy and Occupational Therapy in-home service(s) for you while you recuperate at home, to assist you in the transition from hospital to home.      The agency that you or your representative chose to provide the service:  Name of [place] Placement: [facility]] [phone]        The above services were set up by:  Sharyn Blitz Francesca Strome  [facility] Liaison)   Phone [phone]      IF YOU HAVE NOT HEARD [place] 24-48 HOURS AFTER DISCHARGE PLEASE CALL YOUR AGENCY TO ARRANGE A TIME FOR YOUR FIRST VISIT. FOR ANY SCHEDULING CONCERNS OR QUESTIONS RELATED TO HOME HEALTH, SUCH AS TIME OR DATE PLEASE CONTACT YOUR HOME HEALTH AGENCY AT THE NUMBER LISTED ABOVE.    Additional comments: Patient is a ROC with agency and would like to resume [facility] services with agency. Patient states that she already has a BSC at home      [place]:        Name: SHADIAMOND KOSKA    Discharge Address: [address] [place] [address]      Primary Telephone Number:  [phone]  Secondary Telephone Number: [phone]  Emergency Contact and Number:  Extended Emergency Contact Information  Primary Emergency Contact: okoronwo,shannon  Mobile Phone: [phone]  Relation: Daughter  Interpreter needed? No      Language/Communication Barrier:  No    Primary Diagnosis and Reason for Services:  High LACE score, ESRD (end stage renal disease), Type 2 diabetes mellitus, with long-term current use of insulin, Right sided numbness, Uncontrolled type 2 diabetes mellitus with hyperglycemia, Hypertension, Hyperlipidemia, History of COPD, Gastritis with hemorrhage, History of anemia due to chronic kidney disease, Open wound, abdominal wall, lateral, subsequent encounter, Cerebrovascular accident (CVA), Gout         [employer] (Labs, Wounds, Infusions, etc.): Resume previous wound care orders to abdomen      Additional Comments:  Patient is a ROC with agency    Questions Relating to COVID-19:    1. Have you been out of the country in the past 2 weeks?  No  2. Do you have any upper respiratory symptoms (cough, SOB, fever)? No  3. Have you been exposed to anyone with COVID-19 virus? No    Answer only if pending or positive for COVID-19:    1. Agreeable to wear PPE at each visit?  N/A  2.   Is the hospital supplying them with PPE upon discharge?  No  Discharge Date: 06/29/2019  Referral Source (PACC/Hospital/Unit): Sharyn Blitz Tre Sanker, RN  Referral Date: 06/29/19        Home Health face-to-face (FTF) Encounter (Order [id number])  Consult  Date: 06/29/2019 Department: [facility] Ordering/Authorizing: Dolphus Jenny, MD   Order Information    Order Date/Time Release Date/Time Start Date/Time End Date/Time   06/29/19 04:01 PM None 06/29/19 03:58 PM 06/29/19 03:58 PM   Order Details    Frequency Duration Priority Order Class   Once 1 occurrence Routine Hospital Performed   Standing Order Information    Remaining Occurrences Interval Last Released     0/1 Once 06/29/2019           Provider Information    Ordering User Ordering Provider Authorizing Provider   Lajuan Godbee, Sharyn Blitz, RN Tirrell, Ashley Mariner, MD Dolphus Jenny, MD   Attending Provider(s) Admitting Provider PCP   He, Devin Going, MD PhD; Marya Landry, MD; Dolphus Jenny, MD Romana Juniper Ashley Mariner, MD Pcp, None, MD   Verbal Order Info    Action Created on Order Mode Entered by Responsible Provider Signed by Signed on   Ordering 06/29/19 1601 Telephone with readback Avon Mergenthaler, Sharyn Blitz, RN Tirrell, Ashley Mariner, MD     Comments    Home nursing required for skilled assessment including cardiopulmonary assessment and dietary education for disease management, and medication instruction. Home PT/OT required for gait and balance training, strengthening, mobility, fall prevention, and ADL training.     Dx: High LACE score, ESRD (end stage renal disease), Type 2 diabetes mellitus, with long-term current use of insulin, Right sided numbness, Uncontrolled type 2 diabetes mellitus with hyperglycemia, Hypertension, Hyperlipidemia, History of COPD, Gastritis with hemorrhage, History of anemia due to chronic kidney disease, Open wound, abdominal wall, lateral, subsequent encounter, Cerebrovascular accident (CVA), Gout     Additional Orders:     Resume previous wound care orders to abdomen         Order Questions    Question Answer Comment   Date I saw the patient face-to-face: 06/29/2019    Evidence this patient is homebound because: C. Decreased endurance, strength, ROM, cadence, safety/judgment during mobility     F. Deconditioned due to advance disease process requiring assistance to leave home     G. Fall risk due to impaired coordination, gait and decreased balance     I. Restricted to home to decrease risk of infection     N. Impaired mobility d/t pain, arthritis, weakness that compromises patient safety    Medical conditions that necessitate Home Health care: B. Functional impairment due to recent hospitalization/procedure/treatment     C. Risk for complication/infection/pain requiring follow up and monitoring     E. Exacerbation of  disease requiring follow up monitoring     H. Multiple new medications requiring management and monitoring     J. Wound requiring assessment, monitoring and care    Per clinical findings, following services are medically necessary: Skilled Nursing     PT     OT    Clinical findings that support the need for Skilled Nursing. SN will: C. Monitor for signs and symptoms of exacerbation of disease and management     D. Review medication reconciliation, manage and educate on use and side effects     G. Educate on new diagnosis, treatment & management to prevent re-hospitalization     H. Assess cardiopulmonary status and monitor for signs &symptoms of exacerbation     K. Educate  on complex wound care and management    Clinical findings that support the need for Physical Therapy. PT will A. Evaluate and treat functional impairment and improve mobility     C. Educate on weight bearing status, stair/gait training, balance & coordination     D. Provide services to help restore function, mobility, and releive pain     E. Educate on functional mobility; bed, chair, sit, stand and transfer activities     F. Perform home safety assessment & develop safe in home exercise program     G. Implement activities to improve stance time, cadence & step length    OT will provide assistance with: Home program to improve ability to perform ADLs     Recovery and maintenance skills    Other (please specify) See comments          Process Instructions    Please select Home Care Services medically necessary.     Based on the above findings, I certify that this patient is confined to the home and needs intermittent skilled nursing care, physical therapry and / or speech therapy or continues to need occupational therapy. The patient is under my care, and I have initiated the establishment of the plan of care. This patient will be followed by a physician who will periodically review the plan of care.    Collection Information    Consult  Order Info    ID Description Priority Start Date Start Time   [id number] [place] face-to-face (FTF) Encounter Routine 06/29/2019 3:58 PM   Provider Specialty Referred to   ______________________________________ _____________________________________   Verbal Order Info    Action Created on Order Mode Entered by Responsible Provider Signed by Signed on   Ordering 06/29/19 1601 Telephone with readback Braylen Denunzio, Sharyn Blitz, RN Tirrell, Ashley Mariner, MD     Patient Information    Patient Name   Eleyna, Brugh Legal Sex   Female DOB   09-06-1955   Additional Information    Associated Reports External References   Priority and Order Details [employer]         Signed by: Sharyn Blitz Wilena Tyndall  Date Time: 06/29/19 4:01 PM

## 2019-06-29 NOTE — Plan of Care (Signed)
Pt A&Ox4 and able to make needs known. Oxycodone given scheduled for right wrist pain, 7/10 at highest. Pt requesting multiple snacks overnight - reinforced blood glucose education and prevention of hyperglycemia. Nighttime glucose 341 and insulin given per protocol. No c/o SOB, on room air. Bed alarmed and rails up for fall prevention, call bell within reach.    Problem: Safety  Goal: Patient will be free from injury during hospitalization  Outcome: Progressing  Flowsheets (Taken 06/27/2019 1306 by Mervyn Skeeters, RN)  Patient will be free from injury during hospitalization:   Assess patient's risk for falls and implement fall prevention plan of care per policy   Hourly rounding   Provide and maintain safe environment     Problem: Pain  Goal: Pain at adequate level as identified by patient  Outcome: Progressing  Flowsheets (Taken 06/28/2019 1324 by Deneen Harts, RN)  Pain at adequate level as identified by patient:   Identify patient comfort function goal   Assess for risk of opioid induced respiratory depression, including snoring/sleep apnea. Alert healthcare team of risk factors identified.   Assess pain on admission, during daily assessment and/or before any "as needed" intervention(s)   Reassess pain within 30-60 minutes of any procedure/intervention, per Pain Assessment, Intervention, Reassessment (AIR) Cycle     Problem: Psychosocial and Spiritual Needs  Goal: Demonstrates ability to cope with hospitalization/illness  Outcome: Progressing  Flowsheets (Taken 06/29/2019 0432)  Demonstrates ability to cope with hospitalizations/illness:   Encourage verbalization of feelings/concerns/expectations   Provide quiet environment   Reinforce positive adaptation of new coping behaviors   Encourage patient to set small goals for self     Problem: Renal Instability  Goal: Fluid and electrolyte balance are achieved/maintained  Outcome: Progressing  Flowsheets (Taken 06/28/2019 1602 by Mickel Baas, RN)  Fluid and  electrolyte balance are achieved/maintained:   Monitor/assess lab values and report abnormal values   Monitor daily weight   Assess and reassess fluid and electrolyte status     Problem: Diabetes: Glucose Imbalance  Goal: Blood glucose stable at established goal  Outcome: Progressing  Flowsheets (Taken 06/29/2019 0434)  Blood glucose stable at established goal:   Monitor lab values   Assess for hypoglycemia /hyperglycemia   Monitor/assess vital signs   Ensure appropriate diet and assess tolerance   Ensure appropriate consults are obtained (Nutrition, Diabetes Education, and Case Management)   Ensure patient/family has adequate teaching materials

## 2019-06-29 NOTE — Discharge Instr - AVS First Page (Addendum)
Reason for your Hospital Admission:  Gout  High sugar    Instructions for after your discharge:  Increase your Lantus to 30 units twice a day. Check your blood sugar before meal and before bed. If your blood sugar is 100-200 that is good. If it is lower, you will need to decrease your Lantus. If it is higher, talk to your primary care doctor about increase.     Follow up with your primary care doctor within 1 week.     If you feel weak, about to pass out, anxious, or abnormal please check your sugar. If your sugar is less than 80, take orange juice and crackers to bring the sugar up over 100. Check again in 1 hour.    For gout, you were treated with prednisone. If the problem occurs again,  you will need more prednisone but this created major issues with your blood sugar.     Take care!  Sincerely  Dr Romana Juniper        Home Health Referral          Referral from Jessee Avers (Case Manager) for home health care upon discharge.    By [employer], the patient has the right to freely choose a home care provider.  Arrangements have been made with:     A company of the patients choosing. We have supplied the patient with a listing of providers in your area who asked to be included and participate in Medicare.   [facility], formerly [place], a home care agency that provides adult home care services and participates in Medicare   The preferred provider of your insurance company. Choosing a home care provider other than your insurance company's preferred provider may affect your insurance coverage.      Home Health Discharge Information     Your doctor has ordered Skilled Nursing, Physical Therapy and Occupational Therapy in-home service(s) for you while you recuperate at home, to assist you in the transition from hospital to home.      The agency that you or your representative chose to provide the service:  Name of [place] Placement: [facility]] [phone]        The  above services were set up by:  Sharyn Blitz Artelia Game  [facility] Liaison)   Phone [phone]      IF YOU HAVE NOT HEARD [place] 24-48 HOURS AFTER DISCHARGE PLEASE CALL YOUR AGENCY TO ARRANGE A TIME FOR YOUR FIRST VISIT. FOR ANY SCHEDULING CONCERNS OR QUESTIONS RELATED TO HOME HEALTH, SUCH AS TIME OR DATE PLEASE CONTACT YOUR HOME HEALTH AGENCY AT THE NUMBER LISTED ABOVE.

## 2019-06-29 NOTE — OT Plan of Care Note (Signed)
Discharge Recommendation: SNF   DME Recommended for Discharge: [facility], Shower chair, Medical alert system    OT Frequency Recommended: 2-3x/wk     Is PT evaluation indicated at this time? This patient is already on PT caseload.     PMP Activity: Step 6 - Walks in Room  Distance Walked (ft) (Step 6,7): 15 Feet  (Please See Therapy Evaluation for device and assistance level needed)    Treatment Interventions: ADL retraining, Functional transfer training, UE strengthening/ROM, Endurance training, Patient/Family training, Equipment eval/education, Fine motor coordination activities, Compensatory technique education, Neuro muscular reeducation       Goal Formulation: Patient  Time For Goal Achievement: by time of discharge  ADL Goals  Patient will groom self: Supervision, at sinkside, by time of discharge, Partly met  Patient will dress upper body: Minimal Assist, Not met, Goal met  Patient will dress lower body: Minimal Assist, Not met, Goal met  Patient will toilet: Supervision, by time of discharge, Not met  Mobility and Transfer Goals  Pt will perform functional transfers: Supervision, with rolling walker, by time of discharge, Not met              Reeves Musick S. Jagjit Riner, OTR/L  06/29/2019 2:02 PM

## 2019-06-29 NOTE — Plan of Care (Signed)
Patient is alert x 4, VSS, neuro checks WNL. Denies pain. BS improved with scheduled Humalog.     Problem: Pain  Goal: Pain at adequate level as identified by patient  Outcome: Progressing  Flowsheets (Taken 06/28/2019 1324)  Pain at adequate level as identified by patient:   Identify patient comfort function goal   Assess for risk of opioid induced respiratory depression, including snoring/sleep apnea. Alert healthcare team of risk factors identified.   Assess pain on admission, during daily assessment and/or before any "as needed" intervention(s)   Reassess pain within 30-60 minutes of any procedure/intervention, per Pain Assessment, Intervention, Reassessment (AIR) Cycle     Problem: Every Day - Stroke  Goal: Neurological status is stable or improving  Outcome: Progressing  Flowsheets (Taken 06/28/2019 1324)  Neurological status is stable or improving: Monitor/assess/document neurological assessment (Stroke: every 4 hours)  Goal: Skin integrity is maintained or improved  Outcome: Progressing  Flowsheets (Taken 06/28/2019 1324)  Skin integrity is maintained or improved:   Assess Braden Scale every shift   Avoid shearing  Goal: Neurovascular status is stable or improving  Outcome: Progressing  Flowsheets (Taken 06/25/2019 1844 by Theola Sequin, RN)  Neurovascular status is stable or improving:   Monitor/assess neurovascular status (pulses, capillary refill, pain, paresthesia, presence of edema)   Monitor/assess for signs of Venous Thrombus Emboli (edema of calf/thigh redness, pain)   Monitor/assess site of invasive procedure for signs of bleeding     Problem: Diabetes: Glucose Imbalance  Goal: Blood glucose stable at established goal  Outcome: Progressing  Flowsheets (Taken 06/29/2019 1732)  Blood glucose stable at established goal:   Monitor lab values   Monitor intake and output.  Notify LIP if urine output is < 30 mL/hour.   Assess for hypoglycemia /hyperglycemia

## 2019-06-29 NOTE — Progress Notes (Signed)
Pt is being discharged home on this date.  Per nursing staff, pt's dtr planning to pick her up this evening.  ROC for [place] and OP HD planned  DCP: Home w/ dtr to transport     06/29/19 1536   Discharge Disposition   Patient preference/choice provided? N/A   Physical Discharge Disposition Home, Home Health   Mode of Transportation Car   Patient/Family/POA notified of transfer plan Patient informed only   Patient agreeable to discharge plan/expected d/c date? Yes   Family/POA agreeable to discharge plan/expected d/c date? Yes   Bedside nurse notified of transport plan? Yes   CM Interventions   Follow up appointment scheduled? No   Reason no follow up scheduled? Family to schedule   Referral made for home health RN visit?   (ROC for [facility] requested)   Multidisciplinary rounds/family meeting before d/c? Yes   Pandora Leiter  [phone]

## 2019-06-29 NOTE — Plan of Care (Signed)
Problem: Day of Discharge - Stroke  Goal: Core/Quality measures - Discharge  Flowsheets (Taken 06/29/2019 1736)  Core/Quality measures - Discharge: Document discharge NIH Stroke Scale

## 2019-06-29 NOTE — Progress Notes (Signed)
[facility] PROGRESS NOTE  Aaron Edelman, x [phone] [facility] Spectralink)      Date Time: 06/29/19 8:31 AM  Patient Name: [name]  Attending Physician: Dolphus Jenny, MD    CC: follow-up CKD, CHF    Assessment:     1. CKD- on HD   2. Anemia- CKD and chronic disease  -stable  3. HTN -currently, contolled  4.RUE pain/swelling- superficial vein thrombosis RUE.   5. Nonhealing surgical wound from infected PD catheter removal       Recommendations:     1) HD tomorrow to resume MWF schedule, which is what she in on as OP  2) HD MWF as OP at [place]  3) Epo at OP HD  4) Stable for  from renal standpoint    Case discussed with: pt, HD RN    Eilene Ghazi, MD  [facility]  [phone] (office)  X [phone] (FFX Spectra-Link)      Subjective: Feels well.  Ready to go home    Review of Systems:   Review of Systems - No SOB, no CP, no nausea    Physical Exam:     Vitals:    06/28/19 2133 06/28/19 2338 06/29/19 0453 06/29/19 0821   BP: 113/72 121/66 138/85 125/74   Pulse: 65 64 60 (!) 57   Resp:  17 17 17    Temp:  97.7 F (36.5 C) 97.5 F (36.4 C) 97.9 F (36.6 C)   TempSrc:  Oral Oral Oral   SpO2:  98% 93% 98%   Weight:       Height:           Intake and Output Summary (Last 24 hours) at Date Time    Intake/Output Summary (Last 24 hours) at 06/29/2019 0831  Last data filed at 06/28/2019 2338  Gross per 24 hour   Intake 560 ml   Output 2240 ml   Net -1680 ml       General: awake, alert, no distress  Cardiovascular: regular rate and rhythm  Lungs: clear to auscultation bilaterally, bilateral air entry, normal work of breathing  Abdomen: soft, NT, ND, normoactive bowel sounds  Extremities: No LE edema      Meds:      Scheduled Meds: PRN Meds:    amLODIPine, 5 mg, Oral, Q12H  atorvastatin, 40 mg, Oral, Daily  carvedilol, 25 mg, Oral, Q12H  collagenase, , Topical, Q24H  gabapentin, 600 mg, Oral, TID  heparin (porcine), 5,000 Units, Subcutaneous, Q8H SCH  insulin glargine, 35 Units,  Subcutaneous, Q12H  insulin lispro, 1-4 Units, Subcutaneous, QHS  insulin lispro, 1-8 Units, Subcutaneous, TID AC  insulin lispro, 5 Units, Subcutaneous, TID AC  lactobacillus/streptococcus, 1 capsule, Oral, Daily  oxyCODONE, 5 mg, Oral, Q6H  pantoprazole, 40 mg, Oral, BID  sevelamer, 800 mg, Oral, TID MEALS  sodium hypochlorite, , Irrigation, Daily  vitamin C, 500 mg, Oral, Daily  zinc sulfate, 220 mg, Oral, Daily          Continuous Infusions:   acetaminophen, 650 mg, Q6H PRN   Or  acetaminophen, 650 mg, Q6H PRN  albuterol-ipratropium, 3 mL, Q6H PRN  dextrose, 15 g of glucose, PRN   And  dextrose, 12.5 g, PRN  dextrose, 15 g of glucose, PRN   And  dextrose, 12.5 g, PRN   And  glucagon (rDNA), 1 mg, PRN  hydrALAZINE, 25 mg, Q6H PRN  melatonin, 3 mg, QHS PRN  naloxone, 0.2 mg, PRN  ondansetron, 4 mg,  Q6H PRN   Or  ondansetron, 4 mg, Q6H PRN              Labs:     Recent Labs   Lab 06/28/19  0505 06/27/19  0501 06/26/19  0428   WBC 11.33* 6.70 6.65   Hgb 9.2* 9.1* 8.3*   Hematocrit 30.9* 30.6* 28.2*   Platelets 184 145 140*     Recent Labs   Lab 06/28/19  0505 06/26/19  0428 06/25/19  0300 06/24/19  1811 06/24/19  0503 06/23/19  1229 06/23/19  1229   Sodium 132* 136 138  More results in Results Review 139  More results in Results Review 138   Potassium 5.4* 4.2 3.9  More results in Results Review 4.6  More results in Results Review 4.2   Chloride 96* 100 100  More results in Results Review 104  More results in Results Review 101   CO2 21* 23 26  More results in Results Review 26  More results in Results Review 25   BUN 55.0* 34.0* 20.0*  More results in Results Review 30.0*  More results in Results Review 28.0*   Creatinine 5.3* 4.8* 3.6*  More results in Results Review 5.6*  More results in Results Review 5.6*   Calcium 8.2* 8.1* 8.2*  More results in Results Review 7.8*  More results in Results Review 7.8*   Albumin  --   --   --   --  2.5*  --  2.6*   Phosphorus  --  3.8  --   --   --   --   --    Magnesium  --    --   --   --  1.9  --   --    Glucose 496* 201* 174*  More results in Results Review 269*  More results in Results Review 374*   EGFR 9.8 11.0 15.4  More results in Results Review 9.2  More results in Results Review 9.2   More results in Results Review = values in this interval not displayed.             Signed by: Eilene Ghazi, MD

## 2019-06-29 NOTE — OT Progress Note (Signed)
[facility]  Occupational Therapy Treatment     Patient: [name]     MRN#: [id number]   Unit: [facility]  Bed: A2528/A2528-01    Time of treatment:  Time Calculation  OT Received On: 06/29/19  Start Time: 1320  Stop Time: 1332  Time Calculation (min): 12 min             Chart Review and Collaboration with Care Team: 5 minutes, not included in above time.    OT Visit Number: 2    Precautions and Contraindications:    Precautions  Weight Bearing Status: no restrictions  Other Precautions: Mod fall risk, bleeding    Personal Protective Equipment (PPE)  gloves, procedure mask, disposable N95 and surgical / bouffant cap    Updated Labs:  Lab Results   Component Value Date/Time    HGB 9.2 (L) 06/28/2019 05:05 AM    HCT 30.9 (L) 06/28/2019 05:05 AM    K 5.4 (H) 06/28/2019 05:05 AM    NA 132 (L) 06/28/2019 05:05 AM    INR 1.0 06/23/2019 12:29 PM    TROPI 0.02 06/23/2019 12:29 PM    TROPI 0.04 05/17/2019 06:20 PM    TROPI 0.05 05/17/2019 01:37 PM    TROPI 0.01 03/18/2006 06:10 PM       All imaging reviewed, please see chart for details.    Subjective:    .  "I am going home" "Oh I will be fine"  Patient Goal: go home    Patient's medical condition is appropriate for Occupational Therapy intervention at this time.  Patient is agreeable to participation in the therapy session.  Pain Assessment  Pain Assessment: No/denies pain      Objective:  Observation of Patient/Vital Signs:    VSS throughout the session    Cognition/Neuro Status  Arousal/Alertness: Appropriate responses to stimuli  Attention Span: Appears intact  Orientation Level: Oriented X4  Memory: Appears intact  Following Commands: Follows all commands and directions without difficulty  Safety Awareness: minimal verbal instruction  Insights: Educated in safety awareness  Behavior: calm;cooperative;distractable    Functional Mobility  Sit to Stand Transfers: Nurse, learning disability  Stand to Sit Transfers: Contact Guard  Assist  Functional Mobility/Ambulation: Nurse, learning disability    Self-care and Home Management  Grooming: Stand by Assist;standing at sink;Contact Guard Assist;verbal prompting;supervision/safety;standing with assistive device;teeth care;wash/dry hands  UB Dressing: sitting;Stand by Assist  LB Dressing: Contact Guard Assist;edge of bed;Steadying;Verbal prompting;Supervision/safety;Thread LLE into pants;Thread RLE into pants;Pull up over hips  Functional Transfers: Contact Guard Assist;supervision/safety;verbal prompting;steadying                                    Educated the patient to role of occupational therapy, plan of care, goals of therapy and HEP, safety with mobility and ADLs, energy conservation techniques, pursed lip breathing, discharge instructions, home safety with verbalized understanding.     Patient left in bed with alarm and all other medical equipment in place and call bell and all personal items/needs within reach.  RN notified of session outcome.      Assessment:    Pt engaged in transfers and ambulation with verbal facilitation for technique. Pt educated extensively about home fall prevention and energy conservation work simplification techniques. Attempted to educate pt about the rehab recommendation but pt appeared to be adamant about going home. Pt presents with decreased endurance, decreased activity tolerance,  impaired balance, generalized weakness limiting her independence with ADLs and functional mobility. Pt continues to benefit from skilled acute care OT services to increase her independence and safety with BADLs and functional mobility.                      PMP Activity: Step 6 - Walks in Room  Distance Walked (ft) (Step 6,7): 15 Feet      Plan:  Goal Formulation: Patient      Time For Goal Achievement: by time of discharge  ADL Goals  Patient will groom self: Supervision, at sinkside, by time of discharge, Partly met  Patient will dress upper body: Minimal Assist, Not met, Goal  met  Patient will dress lower body: Minimal Assist, Not met, Goal met  Patient will toilet: Supervision, by time of discharge, Not met  Mobility and Transfer Goals  Pt will perform functional transfers: Supervision, with rolling walker, by time of discharge, Not met                       Continue plan of care.       DME Recommended for Discharge: [facility], Shower chair, Medical alert system  Discharge Recommendation: SNF      Tanelle Lanzo S. Klynn Linnemann, OTR/L  06/29/2019 2:02 PM

## 2019-06-30 ENCOUNTER — Emergency Department: Payer: No Typology Code available for payment source

## 2019-06-30 ENCOUNTER — Inpatient Hospital Stay
Admission: EM | Admit: 2019-06-30 | Discharge: 2019-07-18 | DRG: 466 | Disposition: A | Discharge status: Skilled Nursing Facility | Payer: No Typology Code available for payment source | Attending: Internal Medicine | Admitting: Internal Medicine

## 2019-06-30 DIAGNOSIS — W19XXXA Unspecified fall, initial encounter: Secondary | ICD-10-CM

## 2019-06-30 DIAGNOSIS — Z794 Long term (current) use of insulin: Secondary | ICD-10-CM

## 2019-06-30 DIAGNOSIS — B965 Pseudomonas (aeruginosa) (mallei) (pseudomallei) as the cause of diseases classified elsewhere: Secondary | ICD-10-CM | POA: Diagnosis present

## 2019-06-30 DIAGNOSIS — E1122 Type 2 diabetes mellitus with diabetic chronic kidney disease: Secondary | ICD-10-CM | POA: Diagnosis present

## 2019-06-30 DIAGNOSIS — Z992 Dependence on renal dialysis: Secondary | ICD-10-CM

## 2019-06-30 DIAGNOSIS — Z862 Personal history of diseases of the blood and blood-forming organs and certain disorders involving the immune mechanism: Secondary | ICD-10-CM

## 2019-06-30 DIAGNOSIS — Z20822 Contact with and (suspected) exposure to covid-19: Secondary | ICD-10-CM | POA: Diagnosis present

## 2019-06-30 DIAGNOSIS — S31109D Unspecified open wound of abdominal wall, unspecified quadrant without penetration into peritoneal cavity, subsequent encounter: Secondary | ICD-10-CM

## 2019-06-30 DIAGNOSIS — T8241XA Breakdown (mechanical) of vascular dialysis catheter, initial encounter: Principal | ICD-10-CM | POA: Diagnosis present

## 2019-06-30 DIAGNOSIS — N186 End stage renal disease: Secondary | ICD-10-CM | POA: Diagnosis present

## 2019-06-30 DIAGNOSIS — J449 Chronic obstructive pulmonary disease, unspecified: Secondary | ICD-10-CM | POA: Diagnosis present

## 2019-06-30 DIAGNOSIS — M109 Gout, unspecified: Secondary | ICD-10-CM | POA: Diagnosis present

## 2019-06-30 DIAGNOSIS — Z833 Family history of diabetes mellitus: Secondary | ICD-10-CM

## 2019-06-30 DIAGNOSIS — E8770 Fluid overload, unspecified: Secondary | ICD-10-CM | POA: Diagnosis present

## 2019-06-30 DIAGNOSIS — Z66 Do not resuscitate: Secondary | ICD-10-CM | POA: Diagnosis present

## 2019-06-30 DIAGNOSIS — G4730 Sleep apnea, unspecified: Secondary | ICD-10-CM | POA: Diagnosis present

## 2019-06-30 DIAGNOSIS — Z6841 Body Mass Index (BMI) 40.0 and over, adult: Secondary | ICD-10-CM

## 2019-06-30 DIAGNOSIS — Z8709 Personal history of other diseases of the respiratory system: Secondary | ICD-10-CM

## 2019-06-30 DIAGNOSIS — W010XXA Fall on same level from slipping, tripping and stumbling without subsequent striking against object, initial encounter: Secondary | ICD-10-CM | POA: Diagnosis present

## 2019-06-30 DIAGNOSIS — R296 Repeated falls: Secondary | ICD-10-CM | POA: Diagnosis present

## 2019-06-30 DIAGNOSIS — E11649 Type 2 diabetes mellitus with hypoglycemia without coma: Secondary | ICD-10-CM | POA: Diagnosis not present

## 2019-06-30 DIAGNOSIS — Z96659 Presence of unspecified artificial knee joint: Secondary | ICD-10-CM | POA: Diagnosis present

## 2019-06-30 DIAGNOSIS — L03311 Cellulitis of abdominal wall: Secondary | ICD-10-CM | POA: Diagnosis present

## 2019-06-30 DIAGNOSIS — Z751 Person awaiting admission to adequate facility elsewhere: Secondary | ICD-10-CM

## 2019-06-30 DIAGNOSIS — L02211 Cutaneous abscess of abdominal wall: Secondary | ICD-10-CM | POA: Diagnosis present

## 2019-06-30 DIAGNOSIS — B9562 Methicillin resistant Staphylococcus aureus infection as the cause of diseases classified elsewhere: Secondary | ICD-10-CM | POA: Diagnosis present

## 2019-06-30 DIAGNOSIS — K219 Gastro-esophageal reflux disease without esophagitis: Secondary | ICD-10-CM | POA: Diagnosis present

## 2019-06-30 DIAGNOSIS — I10 Essential (primary) hypertension: Secondary | ICD-10-CM | POA: Diagnosis present

## 2019-06-30 DIAGNOSIS — Z8673 Personal history of transient ischemic attack (TIA), and cerebral infarction without residual deficits: Secondary | ICD-10-CM

## 2019-06-30 DIAGNOSIS — B952 Enterococcus as the cause of diseases classified elsewhere: Secondary | ICD-10-CM | POA: Diagnosis present

## 2019-06-30 DIAGNOSIS — D631 Anemia in chronic kidney disease: Secondary | ICD-10-CM | POA: Diagnosis present

## 2019-06-30 DIAGNOSIS — N2581 Secondary hyperparathyroidism of renal origin: Secondary | ICD-10-CM | POA: Diagnosis present

## 2019-06-30 DIAGNOSIS — E785 Hyperlipidemia, unspecified: Secondary | ICD-10-CM | POA: Diagnosis present

## 2019-06-30 DIAGNOSIS — Y92009 Unspecified place in unspecified non-institutional (private) residence as the place of occurrence of the external cause: Secondary | ICD-10-CM

## 2019-06-30 DIAGNOSIS — R42 Dizziness and giddiness: Secondary | ICD-10-CM

## 2019-06-30 DIAGNOSIS — W19XXXD Unspecified fall, subsequent encounter: Secondary | ICD-10-CM | POA: Diagnosis present

## 2019-06-30 DIAGNOSIS — E1165 Type 2 diabetes mellitus with hyperglycemia: Secondary | ICD-10-CM | POA: Diagnosis present

## 2019-06-30 DIAGNOSIS — S62113D Displaced fracture of triquetrum [cuneiform] bone, unspecified wrist, subsequent encounter for fracture with routine healing: Secondary | ICD-10-CM

## 2019-06-30 DIAGNOSIS — S0003XA Contusion of scalp, initial encounter: Secondary | ICD-10-CM | POA: Diagnosis present

## 2019-06-30 DIAGNOSIS — E875 Hyperkalemia: Secondary | ICD-10-CM | POA: Diagnosis present

## 2019-06-30 DIAGNOSIS — F419 Anxiety disorder, unspecified: Secondary | ICD-10-CM | POA: Diagnosis present

## 2019-06-30 DIAGNOSIS — Z79899 Other long term (current) drug therapy: Secondary | ICD-10-CM

## 2019-06-30 DIAGNOSIS — B372 Candidiasis of skin and nail: Secondary | ICD-10-CM | POA: Diagnosis present

## 2019-06-30 DIAGNOSIS — Z96619 Presence of unspecified artificial shoulder joint: Secondary | ICD-10-CM | POA: Diagnosis present

## 2019-06-30 DIAGNOSIS — L304 Erythema intertrigo: Secondary | ICD-10-CM | POA: Diagnosis present

## 2019-06-30 DIAGNOSIS — Y848 Other medical procedures as the cause of abnormal reaction of the patient, or of later complication, without mention of misadventure at the time of the procedure: Secondary | ICD-10-CM | POA: Diagnosis present

## 2019-06-30 DIAGNOSIS — Z8249 Family history of ischemic heart disease and other diseases of the circulatory system: Secondary | ICD-10-CM

## 2019-06-30 DIAGNOSIS — Z888 Allergy status to other drugs, medicaments and biological substances status: Secondary | ICD-10-CM

## 2019-06-30 DIAGNOSIS — E1142 Type 2 diabetes mellitus with diabetic polyneuropathy: Secondary | ICD-10-CM | POA: Diagnosis present

## 2019-06-30 DIAGNOSIS — Z1624 Resistance to multiple antibiotics: Secondary | ICD-10-CM | POA: Diagnosis present

## 2019-06-30 DIAGNOSIS — I120 Hypertensive chronic kidney disease with stage 5 chronic kidney disease or end stage renal disease: Secondary | ICD-10-CM | POA: Diagnosis present

## 2019-06-30 LAB — COMPREHENSIVE METABOLIC PANEL
ALT: 10 U/L (ref 0–55)
AST (SGOT): 10 U/L (ref 5–34)
Albumin/Globulin Ratio: 0.6 — ABNORMAL LOW (ref 0.9–2.2)
Albumin: 3 g/dL — ABNORMAL LOW (ref 3.5–5.0)
Alkaline Phosphatase: 134 U/L — ABNORMAL HIGH (ref 37–106)
Anion Gap: 15 (ref 5.0–15.0)
BUN: 71 mg/dL — ABNORMAL HIGH (ref 7.0–19.0)
Bilirubin, Total: 0.6 mg/dL (ref 0.2–1.2)
CO2: 20 mEq/L — ABNORMAL LOW (ref 22–29)
Calcium: 8.9 mg/dL (ref 8.5–10.5)
Chloride: 97 mEq/L — ABNORMAL LOW (ref 100–111)
Creatinine: 6.7 mg/dL — ABNORMAL HIGH (ref 0.6–1.0)
Globulin: 5.3 g/dL — ABNORMAL HIGH (ref 2.0–3.6)
Glucose: 271 mg/dL — ABNORMAL HIGH (ref 70–100)
Potassium: 5.3 mEq/L — ABNORMAL HIGH (ref 3.5–5.1)
Protein, Total: 8.3 g/dL (ref 6.0–8.3)
Sodium: 132 mEq/L — ABNORMAL LOW (ref 136–145)

## 2019-06-30 LAB — CBC AND DIFFERENTIAL
Absolute NRBC: 0 10*3/uL (ref 0.00–0.00)
Basophils Absolute Automated: 0.02 10*3/uL (ref 0.00–0.08)
Basophils Automated: 0.1 %
Eosinophils Absolute Automated: 0.09 10*3/uL (ref 0.00–0.44)
Eosinophils Automated: 0.7 %
Hematocrit: 30.8 % — ABNORMAL LOW (ref 34.7–43.7)
Hgb: 9.1 g/dL — ABNORMAL LOW (ref 11.4–14.8)
Immature Granulocytes Absolute: 0.18 10*3/uL — ABNORMAL HIGH (ref 0.00–0.07)
Immature Granulocytes: 1.3 %
Lymphocytes Absolute Automated: 1.69 10*3/uL (ref 0.42–3.22)
Lymphocytes Automated: 12.5 %
MCH: 27.9 pg (ref 25.1–33.5)
MCHC: 29.5 g/dL — ABNORMAL LOW (ref 31.5–35.8)
MCV: 94.5 fL (ref 78.0–96.0)
MPV: 12.1 fL (ref 8.9–12.5)
Monocytes Absolute Automated: 1.21 10*3/uL — ABNORMAL HIGH (ref 0.21–0.85)
Monocytes: 9 %
Neutrophils Absolute: 10.29 10*3/uL — ABNORMAL HIGH (ref 1.10–6.33)
Neutrophils: 76.4 %
Nucleated RBC: 0 /100 WBC (ref 0.0–0.0)
Platelets: 153 10*3/uL (ref 142–346)
RBC: 3.26 10*6/uL — ABNORMAL LOW (ref 3.90–5.10)
RDW: 13 % (ref 11–15)
WBC: 13.48 10*3/uL — ABNORMAL HIGH (ref 3.10–9.50)

## 2019-06-30 LAB — COVID-19 (SARS-COV-2): SARS CoV 2 Overall Result: NEGATIVE

## 2019-06-30 LAB — ACETAMINOPHEN LEVEL: Acetaminophen Level: <7 ug/mL — ABNORMAL LOW (ref 10–30)

## 2019-06-30 LAB — GFR: EGFR: 7.5

## 2019-06-30 LAB — GLUCOSE WHOLE BLOOD - POCT: Whole Blood Glucose POCT: 262 mg/dL — ABNORMAL HIGH (ref 70–100)

## 2019-06-30 LAB — SALICYLATE LEVEL: Salicylate Level: <5 mg/dL — ABNORMAL LOW (ref 15.0–30.0)

## 2019-06-30 LAB — ETHANOL: Alcohol: NOT DETECTED mg/dL

## 2019-06-30 LAB — HEMOLYSIS INDEX: Hemolysis Index: 8 (ref 0–18)

## 2019-06-30 NOTE — ED Notes (Signed)
Bed: BL16  Expected date:   Expected time:   Means of arrival:   Comments:  M,209

## 2019-06-30 NOTE — ED Provider Notes (Signed)
EMERGENCY DEPARTMENT HISTORY AND PHYSICAL EXAM     Physician/Midlevel provider first contact with patient: 06/30/19 2233         Provider Note:       R/o ich, r/o frx, r/o electrolyte abn    Personal Protective Equipment (PPE)    Face Shield, Gloves, Goggles, N95, Geologist, engineering and Surgical / Bouffant Cap      HISTORY OF PRESENT ILLNESS   Historian:Patient  Translator Used: No    64 y.o. female h/o esrd on hd t/th/sat s/p recent discharge, pt had multiple falls at home. Today, she missed the dialysis and came to ED. She has right wrist pain, which is already in a velcro splint.     1. Location of symptoms: neuro  2. Onset of symptoms: past two days  3. What was patient doing when symptoms started (Context): walking  4. Severity: moderate  5. Timing: constant  6. Activities that worsen symptoms: moving  7. Activities that improve symptoms: rest  8. Quality:   9. Radiation of symptoms:  10. Associated signs and Symptoms: see above  11. Are symptoms worsening? Yes    MEDICAL HISTORY     Past Medical History:  Past Medical History:   Diagnosis Date    Chronic obstructive pulmonary disease     Diabetes mellitus     Hyperlipidemia     Hypertension     Sleep apnea 2018       Past Surgical History:  Past Surgical History:   Procedure Laterality Date    EGD, BIOPSY N/A 05/14/2019    Procedure: EGD, BIOPSY;  Surgeon: Ronie Spies, MD;  Location: [facility];  Service: Gastroenterology;  Laterality: N/A;    EXPLORATORY LAPAROTOMY N/A 05/27/2019    Procedure: EXPLORATORY LAPAROTOMY;  Surgeon: Herbert Moors., MD;  Location: [facility];  Service: General;  Laterality: N/A;    JOINT REPLACEMENT      shoulder    JOINT REPLACEMENT      knee    OVARY SURGERY Left     Removal    REMOVAL, FOREIGN BODY N/A 05/27/2019    Procedure: REMOVAL, PERITONEAL DIALYSIS CATHETER;  Surgeon: Herbert Moors., MD;  Location: [facility];  Service: General;  Laterality: N/A;    TUNNELED CATH PLACEMENT ([place])  Bilateral 05/23/2019    Procedure: TUNNELED CATH PLACEMENT;  Surgeon: Jacques Earthly, MD;  Location: [facility];  Service: Interventional Radiology;  Laterality: Bilateral;       Social History:  Social History     Socioeconomic History    Marital status: Legally Separated     Spouse name: Not on file    Number of children: Not on file    Years of education: Not on file    Highest education level: Not on file   Occupational History    Not on file   Tobacco Use    Smoking status: Never Smoker    Smokeless tobacco: Never Used   Substance and Sexual Activity    Alcohol use: Never    Drug use: Never    Sexual activity: Not Currently   Other Topics Concern    Not on file   Social History Narrative    Not on file     Social Determinants of Health     Financial Resource Strain:     Difficulty of Paying Living Expenses:    Food Insecurity:     Worried About [place] in the Last Year:  Ran Out of Food in the Last Year:    Transportation Needs:     Film/video editor (Medical):     Lack of Transportation (Non-Medical):    Physical Activity:     Days of Exercise per Week:     Minutes of Exercise per Session:    Stress:     Feeling of Stress :    Social Connections:     Frequency of Communication with Friends and Family:     Frequency of Social Gatherings with Friends and Family:     Attends Religious Services:     Active Member of Clubs or Organizations:     Attends Music therapist:     Marital Status:    Intimate Partner Violence:     Fear of Current or Ex-Partner:     Emotionally Abused:     Physically Abused:     Sexually Abused:        Family History:  Family History   Problem Relation Age of Onset    Diabetes Mother     Hypertension Mother     Diabetes Father     Hypertension Father     Diabetes Sister     Hypertension Sister     Diabetes Brother     Hypertension Brother     Diabetes Paternal Aunt     Diabetes Paternal Uncle         Allergies:  Allergies   Allergen Reactions    Lisinopril Hives    Metformin Hives       Outpatient Medication:  Current Discharge Medication List      CONTINUE these medications which have NOT CHANGED    Details   amLODIPine (NORVASC) 5 MG tablet Take 1 tablet (5 mg total) by mouth every 12 (twelve) hours  Qty:        atorvastatin (LIPITOR) 40 MG tablet Take 40 mg by mouth daily      carvedilol (COREG) 25 MG tablet Take 1 tablet (25 mg total) by mouth every 12 (twelve) hours  Qty:        Dulaglutide (Trulicity) 0.51 TM/2.1RZ Solution Pen-injector Inject 0.75 mg into the skin once a week      famotidine (PEPCID) 40 MG tablet Take 40 mg by mouth daily      gabapentin (NEURONTIN) 600 MG tablet Take 600 mg by mouth 3 (three) times daily      insulin aspart (NovoLOG) 100 UNIT/ML injection Inject 5 Units into the skin 3 (three) times daily before meals      insulin glargine (LANTUS) 100 UNIT/ML injection Inject 30 Units into the skin every 12 (twelve) hours  Qty: 1 pen, Refills: 0      pantoprazole (PROTONIX) 40 MG tablet Take 1 tablet (40 mg total) by mouth 2 (two) times daily  Qty:        collagenase (SANTYL) ointment Apply topically every 24 hours  Qty: 90 g, Refills: 0      lactobacillus/streptococcus (RISAQUAD) Cap Take 1 capsule by mouth daily      lidocaine (LIDODERM) 5 % Place 1 patch onto the skin every 24 hours Remove & Discard patch within 12 hours or as directed by MD  Qty: 30 each, Refills: 0      oxyCODONE (ROXICODONE) 5 MG immediate release tablet Take 1 tablet (5 mg total) by mouth every 6 (six) hours as needed for Pain      sevelamer (RENVELA) 800 MG tablet Take 800 mg by mouth 3 (  three) times daily with meals      sodium hypochlorite (DAKIN'S QUARTER STRENGTH) 0.125 % Solution Irrigate with as directed daily  Qty: 1 Bottle, Refills: 0      vitamin C (ASCORBIC ACID) 500 MG tablet Take 1 tablet (500 mg total) by mouth daily  Qty: 30 tablet, Refills: 0      zinc Oxide (DESITIN) 40 % Paste paste  Apply topically every 6 (six) hours      zinc sulfate (ZINCATE) 220 (50 Zn) MG capsule Take 1 capsule (220 mg total) by mouth daily  Qty: 30 capsule, Refills: 0             REVIEW OF SYSTEMS   ADD ROS  Review of Systems   Constitutional: Positive for malaise/fatigue.   Musculoskeletal: Positive for falls and joint pain.   Neurological: Positive for dizziness.   All other systems reviewed and are negative.      PHYSICAL EXAM     07/01/19 0005 -- -- -- 98 F (36.7 C) -- -- -- -- -- -- -- AM   07/01/19 0001 144/66 77 25(Abnormal)  -- -- 96 % 87 -- -- -- -- SW     Nursing note and vitals reviewed.  Constitutional:  Well developed, well nourished.   Head: Posterior scalp hematoma. Normocephalic.    Eyes:  PERRL. EOMI. Conjunctivae are not pale.  ENT:  Mucous membranes are moist and intact. Patent airway.  Neck:  Supple. Full ROM.    Cardiovascular:  Regular rate. Regular rhythm. Right chest HD catheter  Respiratory:  No evidence of respiratory distress.   GI:  Soft and non-distended. There is no tenderness. No rebound, guarding, or rigidity.  Back:  Full ROM. Nontender.  MSK:  No edema. No cyanosis. No clubbing. rom in right arm limited 2/2 pain  Skin:  Skin is warm and dry.  No diaphoresis. No rash.   Neurological:  Alert, awake, and appropriate. Normal speech. Motor normal.  Psychiatric:  Good eye contact. Normal interaction, affect, and behavior.      MEDICAL DECISION MAKING     Vital Signs: Reviewed the patients vital signs.   Nursing Notes: Reviewed and utilized available nursing notes.  Medical Records Reviewed: Reviewed available past medical records.      PROCEDURES        CARDIAC STUDIES        Monitor Strip  Interpreted by ED Physician  Rate: 80  Rhythm: SR   ST Changes: none     EKG Interpretation:    22:30 NSR at 82 bpm, NAd, no LVH, qrs 72, qtc 434 (-) ST-T changes similar to ekg on 06/23/2019 as read by me.     IMAGING STUDIES      CT Cervical Spine without Contrast   Final Result         No evidence of  acute cervical spine fracture or malalignment.      Gillermina Phy, MD    06/30/2019 11:56 PM      CT Head without Contrast   Final Result    No acute intracranial findings demonstrated. No acute skull   fracture. Posterior scalp hematoma.      Lorretta Harp, DO    06/30/2019 11:43 PM      Wrist Right PA Lateral And Oblique   Final Result    4 views of the right wrist were compared to a similar study   from 06/25/2019. No new fractures seen. No malalignment. Soft tissue  swelling present. Atherosclerosis.      Lorretta Harp, DO    06/30/2019 11:28 PM             PULSE OXIMETRY    Oxygen Saturation by Pulse Oximetry: 98%  Interventions: none  Interpretation: normal     EMERGENCY DEPT. MEDICATIONS      ED Medication Orders (From admission, onward)    Start Ordered     Status Ordering Provider    07/01/19 0036 07/01/19 0035  aspirin tablet 325 mg  Once     Route: Oral  Ordered Dose: 325 mg     Last MAR action: Given Culver Feighner SHU          LABORATORY RESULTS    Ordered and independently interpreted AVAILABLE laboratory tests. Please see results section in chart for full details.  Results for orders placed or performed during the hospital encounter of 06/30/19   COVID-19 (SARS-COV-2) ([place] [facility])    Specimen: Nasopharynx; Nasopharyngeal Swab   Result Value Ref Range    Purpose of COVID testing Screening     SARS-CoV-2 Specimen Source Nasopharyngeal     SARS CoV 2 Overall Result Negative    Acetaminophen level   Result Value Ref Range    Acetaminophen Level <7 (L) 10 - 30 ug/mL   Ethanol (Alcohol) Level   Result Value Ref Range    Alcohol None Detected None Detected mg/dL   Salicylate level   Result Value Ref Range    Salicylate Level <5.7 (L) 15.0 - 30.0 mg/dL   CBC and differential   Result Value Ref Range    WBC 13.48 (H) 3.10 - 9.50 x10 3/uL    Hgb 9.1 (L) 11.4 - 14.8 g/dL    Hematocrit 30.8 (L) 34.7 - 43.7 %    Platelets 153 142 - 346 x10 3/uL    RBC 3.26 (L) 3.90 - 5.10 x10 6/uL    MCV 94.5 78.0 - 96.0 fL    MCH  27.9 25.1 - 33.5 pg    MCHC 29.5 (L) 31.5 - 35.8 g/dL    RDW 13 11 - 15 %    MPV 12.1 8.9 - 12.5 fL    Neutrophils 76.4 None %    Lymphocytes Automated 12.5 None %    Monocytes 9.0 None %    Eosinophils Automated 0.7 None %    Basophils Automated 0.1 None %    Immature Granulocytes 1.3 None %    Nucleated RBC 0.0 0.0 - 0.0 /100 WBC    Neutrophils Absolute 10.29 (H) 1.10 - 6.33 x10 3/uL    Lymphocytes Absolute Automated 1.69 0.42 - 3.22 x10 3/uL    Monocytes Absolute Automated 1.21 (H) 0.21 - 0.85 x10 3/uL    Eosinophils Absolute Automated 0.09 0.00 - 0.44 x10 3/uL    Basophils Absolute Automated 0.02 0.00 - 0.08 x10 3/uL    Immature Granulocytes Absolute 0.18 (H) 0.00 - 0.07 x10 3/uL    Absolute NRBC 0.00 0.00 - 0.00 x10 3/uL   Comprehensive metabolic panel   Result Value Ref Range    Glucose 271 (H) 70 - 100 mg/dL    BUN 71.0 (H) 7.0 - 19.0 mg/dL    Creatinine 6.7 (H) 0.6 - 1.0 mg/dL    Sodium 132 (L) 136 - 145 mEq/L    Potassium 5.3 (H) 3.5 - 5.1 mEq/L    Chloride 97 (L) 100 - 111 mEq/L    CO2 20 (L) 22 - 29 mEq/L  Calcium 8.9 8.5 - 10.5 mg/dL    Protein, Total 8.3 6.0 - 8.3 g/dL    Albumin 3.0 (L) 3.5 - 5.0 g/dL    AST (SGOT) 10 5 - 34 U/L    ALT 10 0 - 55 U/L    Alkaline Phosphatase 134 (H) 37 - 106 U/L    Bilirubin, Total 0.6 0.2 - 1.2 mg/dL    Globulin 5.3 (H) 2.0 - 3.6 g/dL    Albumin/Globulin Ratio 0.6 (L) 0.9 - 2.2    Anion Gap 15.0 5.0 - 15.0   Hemolysis index   Result Value Ref Range    Hemolysis Index 8 0 - 18   GFR   Result Value Ref Range    EGFR 7.5    Glucose Whole Blood - POCT   Result Value Ref Range    Whole Blood Glucose POCT 262 (H) 70 - 100 mg/dL       CLINICAL DECISION SUPPORT   NIH Stroke Score      Most Recent Value   Patient's calculated Stroke Score:  1 filed at 07/01/2019 0000            ED Course      D/w Dr. Marcy Siren, will admit     1 am d/w Dr. Shaune Pascal, nephrology, will consult.         2. Aspirin: Aspirin was not given because the patient did not present with a stroke at the time of  their Emergency Department evaluation.    3. Core Measures:   Please choose ONLY A, B, or C for your sepsis documentation      A. ------------Severe Sepsis/Septic Shock Exclusion----------------------------    Pt is excluded from severe sepsis or septic shock consideration at the time of admission at 12:35 am [enter time] because of one or more reasons below:    All SIRS, abnl vitals, organ dysfunction are NOT due to severe sepsis or septic shock, but due to alternative cause: esrd [list cause].     The timestamp from the preceding paragraph above also applies to every infectious and/or SEP-1-related diagnosis in Clinical Impression or MDM sections of this note.         ATTESTATIONS      Physician Attestation: Di Kindle Gailyn Crook , have been the primary provider for [name] during this Emergency Dept visit and have reviewed the chart documented for accuracy and agree with its content.       DIAGNOSIS      Diagnosis:  Final diagnoses:   Dizziness   ESRD (end stage renal disease) on dialysis       Disposition:  ED Disposition     ED Disposition Condition Date/Time Comment    Observation  Tue Jul 01, 2019 12:35 AM Admitting Physician: Melynda Ripple, [place]   Service:: Medicine [[id number]]   Estimated Length of Stay: < 2 midnights   Tentative Discharge Plan?: Home or Self Care [1]   Does patient need telemetry?: Yes   Telemetry type (separate Telemetry order is also required):: Cardiac telemetry            Prescriptions:  Current Discharge Medication List      CONTINUE these medications which have NOT CHANGED    Details   amLODIPine (NORVASC) 5 MG tablet Take 1 tablet (5 mg total) by mouth every 12 (twelve) hours  Qty:        atorvastatin (LIPITOR) 40 MG tablet Take 40 mg by mouth daily  carvedilol (COREG) 25 MG tablet Take 1 tablet (25 mg total) by mouth every 12 (twelve) hours  Qty:        Dulaglutide (Trulicity) 1.61 WR/6.0AV Solution Pen-injector Inject 0.75 mg into the skin once a week      famotidine  (PEPCID) 40 MG tablet Take 40 mg by mouth daily      gabapentin (NEURONTIN) 600 MG tablet Take 600 mg by mouth 3 (three) times daily      insulin aspart (NovoLOG) 100 UNIT/ML injection Inject 5 Units into the skin 3 (three) times daily before meals      insulin glargine (LANTUS) 100 UNIT/ML injection Inject 30 Units into the skin every 12 (twelve) hours  Qty: 1 pen, Refills: 0      pantoprazole (PROTONIX) 40 MG tablet Take 1 tablet (40 mg total) by mouth 2 (two) times daily  Qty:        collagenase (SANTYL) ointment Apply topically every 24 hours  Qty: 90 g, Refills: 0      lactobacillus/streptococcus (RISAQUAD) Cap Take 1 capsule by mouth daily      lidocaine (LIDODERM) 5 % Place 1 patch onto the skin every 24 hours Remove & Discard patch within 12 hours or as directed by MD  Qty: 30 each, Refills: 0      oxyCODONE (ROXICODONE) 5 MG immediate release tablet Take 1 tablet (5 mg total) by mouth every 6 (six) hours as needed for Pain      sevelamer (RENVELA) 800 MG tablet Take 800 mg by mouth 3 (three) times daily with meals      sodium hypochlorite (DAKIN'S QUARTER STRENGTH) 0.125 % Solution Irrigate with as directed daily  Qty: 1 Bottle, Refills: 0      vitamin C (ASCORBIC ACID) 500 MG tablet Take 1 tablet (500 mg total) by mouth daily  Qty: 30 tablet, Refills: 0      zinc Oxide (DESITIN) 40 % Paste paste Apply topically every 6 (six) hours      zinc sulfate (ZINCATE) 220 (50 Zn) MG capsule Take 1 capsule (220 mg total) by mouth daily  Qty: 30 capsule, Refills: 0                    Dayra Rapley, Devin Going, MD PhD  07/01/19 [phone]

## 2019-07-01 ENCOUNTER — Observation Stay: Payer: No Typology Code available for payment source

## 2019-07-01 DIAGNOSIS — W19XXXA Unspecified fall, initial encounter: Secondary | ICD-10-CM

## 2019-07-01 DIAGNOSIS — R42 Dizziness and giddiness: Secondary | ICD-10-CM | POA: Diagnosis present

## 2019-07-01 LAB — BASIC METABOLIC PANEL
Anion Gap: 13 (ref 5.0–15.0)
Anion Gap: 12 (ref 5.0–15.0)
BUN: 73 mg/dL — ABNORMAL HIGH (ref 7.0–19.0)
BUN: 56 mg/dL — ABNORMAL HIGH (ref 7.0–19.0)
CO2: 20 mEq/L — ABNORMAL LOW (ref 22–29)
CO2: 22 mEq/L (ref 22–29)
Calcium: 8.2 mg/dL — ABNORMAL LOW (ref 8.5–10.5)
Calcium: 7.9 mg/dL — ABNORMAL LOW (ref 8.5–10.5)
Chloride: 100 mEq/L (ref 100–111)
Chloride: 100 mEq/L (ref 100–111)
Creatinine: 6.6 mg/dL — ABNORMAL HIGH (ref 0.6–1.0)
Creatinine: 5.2 mg/dL — ABNORMAL HIGH (ref 0.6–1.0)
Glucose: 226 mg/dL — ABNORMAL HIGH (ref 70–100)
Glucose: 322 mg/dL — ABNORMAL HIGH (ref 70–100)
Potassium: 5.3 mEq/L — ABNORMAL HIGH (ref 3.5–5.1)
Potassium: 5.3 mEq/L — ABNORMAL HIGH (ref 3.5–5.1)
Sodium: 133 mEq/L — ABNORMAL LOW (ref 136–145)
Sodium: 134 mEq/L — ABNORMAL LOW (ref 136–145)

## 2019-07-01 LAB — URINALYSIS REFLEX TO MICROSCOPIC EXAM - REFLEX TO CULTURE
Bilirubin, UA: NEGATIVE
Blood, UA: NEGATIVE
Glucose, UA: 500 — AB
Ketones UA: NEGATIVE
Leukocyte Esterase, UA: NEGATIVE
Nitrite, UA: NEGATIVE
Protein, UR: >=500 — AB
Specific Gravity UA: 1.015 (ref 1.001–1.035)
Urine pH: 6 (ref 5.0–8.0)
Urobilinogen, UA: NEGATIVE mg/dL (ref 0.2–2.0)

## 2019-07-01 LAB — CBC
Absolute NRBC: 0 10*3/uL (ref 0.00–0.00)
Hematocrit: 27.8 % — ABNORMAL LOW (ref 34.7–43.7)
Hgb: 8.2 g/dL — ABNORMAL LOW (ref 11.4–14.8)
MCH: 28.3 pg (ref 25.1–33.5)
MCHC: 29.5 g/dL — ABNORMAL LOW (ref 31.5–35.8)
MCV: 95.9 fL (ref 78.0–96.0)
MPV: 12.4 fL (ref 8.9–12.5)
Nucleated RBC: 0 /100 WBC (ref 0.0–0.0)
Platelets: 115 10*3/uL — ABNORMAL LOW (ref 142–346)
RBC: 2.9 10*6/uL — ABNORMAL LOW (ref 3.90–5.10)
RDW: 13 % (ref 11–15)
WBC: 10.99 10*3/uL — ABNORMAL HIGH (ref 3.10–9.50)

## 2019-07-01 LAB — CBC AND DIFFERENTIAL
Absolute NRBC: 0 10*3/uL (ref 0.00–0.00)
Basophils Absolute Automated: 0.03 10*3/uL (ref 0.00–0.08)
Basophils Automated: 0.3 %
Eosinophils Absolute Automated: 0.1 10*3/uL (ref 0.00–0.44)
Eosinophils Automated: 0.9 %
Hematocrit: 26.9 % — ABNORMAL LOW (ref 34.7–43.7)
Hgb: 8.1 g/dL — ABNORMAL LOW (ref 11.4–14.8)
Immature Granulocytes Absolute: 0.13 10*3/uL — ABNORMAL HIGH (ref 0.00–0.07)
Immature Granulocytes: 1.2 %
Lymphocytes Absolute Automated: 1.5 10*3/uL (ref 0.42–3.22)
Lymphocytes Automated: 14.2 %
MCH: 28.3 pg (ref 25.1–33.5)
MCHC: 30.1 g/dL — ABNORMAL LOW (ref 31.5–35.8)
MCV: 94.1 fL (ref 78.0–96.0)
MPV: 12 fL (ref 8.9–12.5)
Monocytes Absolute Automated: 0.78 10*3/uL (ref 0.21–0.85)
Monocytes: 7.4 %
Neutrophils Absolute: 8.06 10*3/uL — ABNORMAL HIGH (ref 1.10–6.33)
Neutrophils: 76 %
Nucleated RBC: 0 /100 WBC (ref 0.0–0.0)
Platelets: 126 10*3/uL — ABNORMAL LOW (ref 142–346)
RBC: 2.86 10*6/uL — ABNORMAL LOW (ref 3.90–5.10)
RDW: 13 % (ref 11–15)
WBC: 10.6 10*3/uL — ABNORMAL HIGH (ref 3.10–9.50)

## 2019-07-01 LAB — GLUCOSE WHOLE BLOOD - POCT
Whole Blood Glucose POCT: 214 mg/dL — ABNORMAL HIGH (ref 70–100)
Whole Blood Glucose POCT: 239 mg/dL — ABNORMAL HIGH (ref 70–100)
Whole Blood Glucose POCT: 312 mg/dL — ABNORMAL HIGH (ref 70–100)
Whole Blood Glucose POCT: 309 mg/dL — ABNORMAL HIGH (ref 70–100)

## 2019-07-01 LAB — ECG 12-LEAD
Atrial Rate: 82 {beats}/min
P Axis: 35 degrees
P-R Interval: 136 ms
Q-T Interval: 372 ms
QRS Duration: 72 ms
QTC Calculation (Bezet): 434 ms
R Axis: 38 degrees
T Axis: 63 degrees
Ventricular Rate: 82 {beats}/min

## 2019-07-01 LAB — HEMOLYSIS INDEX
Hemolysis Index: 3 (ref 0–18)
Hemolysis Index: 2 (ref 0–18)

## 2019-07-01 LAB — PT/INR
PT INR: 1.1 (ref 0.9–1.1)
PT: 12.7 s (ref 10.1–12.9)

## 2019-07-01 LAB — CK: Creatine Kinase (CK): 20 U/L — ABNORMAL LOW (ref 29–168)

## 2019-07-01 LAB — GFR
EGFR: 7.6
EGFR: 10.1

## 2019-07-01 MED ORDER — NALOXONE HCL 0.4 MG/ML IJ SOLN (WRAP)
0.20 mg | INTRAMUSCULAR | Status: DC | PRN
Start: 2019-07-01 — End: 2019-07-18

## 2019-07-01 MED ORDER — OXYCODONE HCL 5 MG PO TABS
5.0000 mg | ORAL_TABLET | Freq: Four times a day (QID) | ORAL | Status: DC | PRN
Start: 2019-07-01 — End: 2019-07-18
  Administered 2019-07-01 – 2019-07-16 (×19): 5 mg via ORAL
  Filled 2019-07-01 (×20): qty 1

## 2019-07-01 MED ORDER — DAKINS (1/4 STRENGTH) 0.125 % EX SOLN
Freq: Every day | CUTANEOUS | Status: DC
Start: 2019-07-01 — End: 2019-07-18
  Filled 2019-07-01 (×3): qty 473

## 2019-07-01 MED ORDER — PANTOPRAZOLE SODIUM 40 MG PO TBEC
40.00 mg | DELAYED_RELEASE_TABLET | Freq: Two times a day (BID) | ORAL | Status: DC
Start: 2019-07-01 — End: 2019-07-18
  Administered 2019-07-01 – 2019-07-18 (×34): 40 mg via ORAL
  Filled 2019-07-01 (×34): qty 1

## 2019-07-01 MED ORDER — CARVEDILOL 12.5 MG PO TABS
25.0000 mg | ORAL_TABLET | Freq: Two times a day (BID) | ORAL | Status: DC
Start: 2019-07-01 — End: 2019-07-18
  Administered 2019-07-01 – 2019-07-18 (×30): 25 mg via ORAL
  Filled 2019-07-01 (×2): qty 1
  Filled 2019-07-01 (×3): qty 2
  Filled 2019-07-01: qty 1
  Filled 2019-07-01 (×16): qty 2
  Filled 2019-07-01 (×2): qty 1
  Filled 2019-07-01 (×3): qty 2
  Filled 2019-07-01: qty 1
  Filled 2019-07-01 (×6): qty 2
  Filled 2019-07-01: qty 1

## 2019-07-01 MED ORDER — INSULIN LISPRO 100 UNIT/ML SC SOLN
5.00 [IU] | Freq: Once | SUBCUTANEOUS | Status: AC
Start: 2019-07-01 — End: 2019-07-01
  Administered 2019-07-01: 22:00:00 5 [IU] via SUBCUTANEOUS
  Filled 2019-07-01: qty 15

## 2019-07-01 MED ORDER — GABAPENTIN 300 MG PO CAPS
300.00 mg | ORAL_CAPSULE | Freq: Every day | ORAL | Status: DC
Start: 2019-07-01 — End: 2019-07-18
  Administered 2019-07-01 – 2019-07-18 (×18): 300 mg via ORAL
  Filled 2019-07-01 (×18): qty 1

## 2019-07-01 MED ORDER — ASCORBIC ACID 500 MG PO TABS
500.0000 mg | ORAL_TABLET | Freq: Every day | ORAL | Status: DC
Start: 2019-07-01 — End: 2019-07-18
  Administered 2019-07-01 – 2019-07-18 (×17): 500 mg via ORAL
  Filled 2019-07-01 (×17): qty 1

## 2019-07-01 MED ORDER — ASPIRIN 325 MG PO TABS
325.00 mg | ORAL_TABLET | Freq: Once | ORAL | Status: AC
Start: 2019-07-01 — End: 2019-07-01
  Administered 2019-07-01: 01:00:00 325 mg via ORAL
  Filled 2019-07-01: qty 1

## 2019-07-01 MED ORDER — GLUCAGON 1 MG IJ SOLR (WRAP)
1.00 mg | INTRAMUSCULAR | Status: DC | PRN
Start: 2019-07-01 — End: 2019-07-18

## 2019-07-01 MED ORDER — ONDANSETRON 4 MG PO TBDP
4.00 mg | ORAL_TABLET | Freq: Four times a day (QID) | ORAL | Status: DC | PRN
Start: 2019-07-01 — End: 2019-07-18

## 2019-07-01 MED ORDER — ACETAMINOPHEN 325 MG PO TABS
650.0000 mg | ORAL_TABLET | Freq: Four times a day (QID) | ORAL | Status: DC | PRN
Start: 2019-07-01 — End: 2019-07-02

## 2019-07-01 MED ORDER — HEPARIN SODIUM (PORCINE) 5000 UNIT/ML IJ SOLN
5000.00 [IU] | Freq: Three times a day (TID) | INTRAMUSCULAR | Status: AC
Start: 2019-07-01 — End: 2019-07-02
  Administered 2019-07-01 – 2019-07-02 (×5): 5000 [IU] via SUBCUTANEOUS
  Filled 2019-07-01 (×5): qty 1

## 2019-07-01 MED ORDER — SODIUM CHLORIDE 0.9 % IV BOLUS
250.0000 mL | INTRAVENOUS | Status: AC | PRN
Start: 2019-07-01 — End: 2019-07-01

## 2019-07-01 MED ORDER — ZINC SULFATE 220 (50 ZN) MG PO CAPS
220.00 mg | ORAL_CAPSULE | Freq: Every day | ORAL | Status: DC
Start: 2019-07-01 — End: 2019-07-18
  Administered 2019-07-01 – 2019-07-18 (×17): 220 mg via ORAL
  Filled 2019-07-01 (×17): qty 1

## 2019-07-01 MED ORDER — GLUCOSE 40 % PO GEL
15.00 g | ORAL | Status: DC | PRN
Start: 2019-07-01 — End: 2019-07-18

## 2019-07-01 MED ORDER — SODIUM CHLORIDE 0.9 % IV BOLUS
100.0000 mL | INTRAVENOUS | Status: AC | PRN
Start: 2019-07-01 — End: 2019-07-01

## 2019-07-01 MED ORDER — COLLAGENASE 250 UNIT/GM EX OINT
TOPICAL_OINTMENT | CUTANEOUS | Status: DC
Start: 2019-07-01 — End: 2019-07-18
  Filled 2019-07-01 (×4): qty 30

## 2019-07-01 MED ORDER — ALTEPLASE 2 MG IJ SOLR
2.0000 mg | INTRAMUSCULAR | Status: AC | PRN
Start: 2019-07-01 — End: 2019-07-01
  Administered 2019-07-01: 11:00:00 2 mg
  Filled 2019-07-01: qty 2

## 2019-07-01 MED ORDER — ATORVASTATIN CALCIUM 40 MG PO TABS
40.0000 mg | ORAL_TABLET | Freq: Every day | ORAL | Status: DC
Start: 2019-07-01 — End: 2019-07-18
  Administered 2019-07-01 – 2019-07-18 (×18): 40 mg via ORAL
  Filled 2019-07-01 (×18): qty 1

## 2019-07-01 MED ORDER — ONDANSETRON HCL 4 MG/2ML IJ SOLN
4.00 mg | Freq: Four times a day (QID) | INTRAMUSCULAR | Status: DC | PRN
Start: 2019-07-01 — End: 2019-07-18
  Administered 2019-07-14: 09:00:00 4 mg via INTRAVENOUS
  Filled 2019-07-01: qty 2

## 2019-07-01 MED ORDER — ACETAMINOPHEN 650 MG RE SUPP
650.00 mg | Freq: Four times a day (QID) | RECTAL | Status: DC | PRN
Start: 2019-07-01 — End: 2019-07-02

## 2019-07-01 MED ORDER — INSULIN LISPRO 100 UNIT/ML SC SOLN
5.00 [IU] | Freq: Three times a day (TID) | SUBCUTANEOUS | Status: DC
Start: 2019-07-01 — End: 2019-07-07
  Administered 2019-07-01 – 2019-07-07 (×12): 5 [IU] via SUBCUTANEOUS
  Filled 2019-07-01 (×12): qty 15

## 2019-07-01 MED ORDER — AMLODIPINE BESYLATE 5 MG PO TABS
5.0000 mg | ORAL_TABLET | Freq: Two times a day (BID) | ORAL | Status: DC
Start: 2019-07-01 — End: 2019-07-14
  Administered 2019-07-01 – 2019-07-14 (×22): 5 mg via ORAL
  Filled 2019-07-01 (×26): qty 1

## 2019-07-01 MED ORDER — GABAPENTIN 300 MG PO CAPS
600.00 mg | ORAL_CAPSULE | Freq: Three times a day (TID) | ORAL | Status: DC
Start: 2019-07-01 — End: 2019-07-01

## 2019-07-01 MED ORDER — MELATONIN 1 MG PO TABS
3.0000 mg | ORAL_TABLET | Freq: Every evening | ORAL | Status: DC | PRN
Start: 2019-07-01 — End: 2019-07-18
  Administered 2019-07-14 – 2019-07-15 (×2): 3 mg via ORAL
  Filled 2019-07-01 (×2): qty 3

## 2019-07-01 MED ORDER — INSULIN GLARGINE 100 UNIT/ML SC SOLN
35.00 [IU] | Freq: Every evening | SUBCUTANEOUS | Status: DC
Start: 2019-07-01 — End: 2019-07-06
  Administered 2019-07-01 – 2019-07-02 (×2): 17 [IU] via SUBCUTANEOUS
  Administered 2019-07-03 – 2019-07-05 (×3): 35 [IU] via SUBCUTANEOUS
  Filled 2019-07-01 (×3): qty 35
  Filled 2019-07-01: qty 17
  Filled 2019-07-01: qty 35

## 2019-07-01 MED ORDER — DEXTROSE 50 % IV SOLN
12.50 g | INTRAVENOUS | Status: DC | PRN
Start: 2019-07-01 — End: 2019-07-18

## 2019-07-01 MED ORDER — INSULIN GLARGINE 100 UNIT/ML SC SOLN
30.00 [IU] | Freq: Every evening | SUBCUTANEOUS | Status: DC
Start: 2019-07-01 — End: 2019-07-01

## 2019-07-01 MED ORDER — SEVELAMER CARBONATE 800 MG PO TABS
800.0000 mg | ORAL_TABLET | Freq: Three times a day (TID) | ORAL | Status: DC
Start: 2019-07-01 — End: 2019-07-18
  Administered 2019-07-01 – 2019-07-17 (×45): 800 mg via ORAL
  Filled 2019-07-01 (×45): qty 1

## 2019-07-01 NOTE — PT Eval Note (Signed)
[facility]  Physical Therapy Attempt Note    Patient:  [name] MRN#:  [id number]  Unit:  [facility] Room/Bed:  O7096/G8366-Q      Physical Therapy evaluation attempted on 07/01/2019 at 11:04 AM unable to complete secondary to pt remains in dialysis at this time.  Therapy to f/u later today as able.      Lamar Laundry, PT, [place] #[id number]  07/01/2019 11:05 AM

## 2019-07-01 NOTE — Plan of Care (Signed)
Problem: Renal Instability  Goal: Fluid and electrolyte balance are achieved/maintained  Flowsheets (Taken 07/01/2019 1005)  Fluid and electrolyte balance are achieved/maintained:   Monitor intake and output every shift   Monitor daily weight   Monitor/assess lab values and report abnormal values   Assess and reassess fluid and electrolyte status   Observe for cardiac arrhythmias   Follow fluid restrictions/IV/PO parameters  Goal: Nutritional intake is adequate  Flowsheets (Taken 07/01/2019 0945)  Nutritional intake is adequate:   Monitor daily weights   Assist patient with meals/food selection   Consult/collaborate with Clinical Nutritionist   Include patient/patient care companion in decisions related to nutrition   Assess anorexia, appetite, and amount of meal/food tolerated  Goal: Free from infection  Flowsheets (Taken 07/01/2019 1005)  Free from infection: Monitor/assess for signs and symptoms of infection     Problem: Patient Receiving Advanced Renal Therapies  Goal: Therapy access site remains intact  Flowsheets (Taken 07/01/2019 1005)  Therapy access site remains intact:   Assess therapy access site   Change therapy access site dressing as needed

## 2019-07-01 NOTE — Consults (Signed)
[facility]  CONSULT  Aaron Edelman, x [phone] ([place])      Date Time: 07/01/19 2:57 PM  Patient Name: [name]  Requesting Physician: Forest Becker, MD  Consulting Physician: Verl Blalock, MD    Primary Care Physician: Pcp, None, MD    Reason for Consultation: ESRD, provision of hemodialysis      Assessment:     Patient Active Problem List   Diagnosis    Iron deficiency anemia secondary to inadequate dietary iron intake    Sideropenic dysphagia    Peritoneal dialysis catheter dysfunction, initial encounter    Peritonitis    Upper GI bleed    ESRD (end stage renal disease) on dialysis    Generalized abdominal pain    Thrombocytopenia    Hypotension    Type 2 diabetes mellitus, with long-term current use of insulin    GERD (gastroesophageal reflux disease)    Bowel incontinence    Right sided numbness    Uncontrolled type 2 diabetes mellitus with hyperglycemia    Hypertension    Hyperlipidemia    History of COPD    Gastritis with hemorrhage    History of anemia due to chronic kidney disease    Open wound, abdominal wall, lateral, subsequent encounter    Cerebrovascular accident (CVA), unspecified mechanism    Gout    Dizziness    Fall     1.ESRD on HD MWF at [facility], follows with Dr.Lowery  2. HTN  3. Anemia of chronic disease   4. Secondary hyperparathyroidism  5. Mechanical fall      Recommendations:     1. Attempted HD today, however, elevated arterial CVC pressure, tPA unsuccessful, only able to be dialyzed for 2hr.   2. NPO after midnight for new HD tunneled catheter placement tomorrow  3. HD tomorrow, then continue MWF as routine OP schedule  4. Pain control   5. Check phos with AM labs        Forest Becker, MD, thank you for this consultation.  We will follow the patient with you during this hospitalization.  Please contact me with any questions or issues.    Signed by: Verl Blalock, MD  [facility]  [phone]  (office)  x [phone] ([place])      -----------------------------------------------------------------------------------------------------------        History of Presenting Illness:   [name] is a 64 y.o. female with ESRD iniitally maintained on PD, transitioned to HD on 05/26/19 after PD associated peritonitis, HTN, anemia of chronic disease, UGI bleed secondary to gastritis 4/20/2, and recently diagnosed gout who presents to the hospital after mechanical fall after being discharge the same day. Nephrology consulted for provision of hemodialysis.     [name] reports that yesterday as she was walking to her apartment with her walker, tripped and fell, had trauma to her head and RUE, did not lost consciousness. Upon arrival to the ED, found to be slightly hypertensive. Labs notable for CUN/Cr 71/6.7, K 5.3mg /dL. CT head/jcervical spine negative for acute findings; x-ray of the wrist negative for new fracture.     Past Medical History:     Past Medical History:   Diagnosis Date    Chronic obstructive pulmonary disease     Diabetes mellitus     Hyperlipidemia     Hypertension     Sleep apnea 2018           Past Surgical History:     Past  Surgical History:   Procedure Laterality Date    EGD, BIOPSY N/A 05/14/2019    Procedure: EGD, BIOPSY;  Surgeon: Ronie Spies, MD;  Location: [facility];  Service: Gastroenterology;  Laterality: N/A;    EXPLORATORY LAPAROTOMY N/A 05/27/2019    Procedure: EXPLORATORY LAPAROTOMY;  Surgeon: Herbert Moors., MD;  Location: [facility];  Service: General;  Laterality: N/A;    JOINT REPLACEMENT      shoulder    JOINT REPLACEMENT      knee    OVARY SURGERY Left     Removal    REMOVAL, FOREIGN BODY N/A 05/27/2019    Procedure: REMOVAL, PERITONEAL DIALYSIS CATHETER;  Surgeon: Herbert Moors., MD;  Location: [facility];  Service: General;  Laterality: N/A;    TUNNELED CATH PLACEMENT ([place]) Bilateral 05/23/2019    Procedure: TUNNELED CATH  PLACEMENT;  Surgeon: Jacques Earthly, MD;  Location: [facility];  Service: Interventional Radiology;  Laterality: Bilateral;       Family History:     Family History   Problem Relation Age of Onset    Diabetes Mother     Hypertension Mother     Diabetes Father     Hypertension Father     Diabetes Sister     Hypertension Sister     Diabetes Brother     Hypertension Brother     Diabetes Paternal Aunt     Diabetes Paternal Uncle        Social History:     Social History     Socioeconomic History    Marital status: Legally Separated     Spouse name: Not on file    Number of children: Not on file    Years of education: Not on file    Highest education level: Not on file   Occupational History    Not on file   Tobacco Use    Smoking status: Never Smoker    Smokeless tobacco: Never Used   Substance and Sexual Activity    Alcohol use: Never    Drug use: Never    Sexual activity: Not Currently   Other Topics Concern    Not on file   Social History Narrative    Not on file     Social Determinants of Health     Financial Resource Strain:     Difficulty of Paying Living Expenses:    Food Insecurity:     Worried About Charity fundraiser in the Last Year:     Arboriculturist in the Last Year:    Transportation Needs:     Film/video editor (Medical):     Lack of Transportation (Non-Medical):    Physical Activity:     Days of Exercise per Week:     Minutes of Exercise per Session:    Stress:     Feeling of Stress :    Social Connections:     Frequency of Communication with Friends and Family:     Frequency of Social Gatherings with Friends and Family:     Attends Religious Services:     Active Member of Clubs or Organizations:     Attends Archivist Meetings:     Marital Status:    Intimate Partner Violence:     Fear of Current or Ex-Partner:     Emotionally Abused:     Physically Abused:     Sexually Abused:  Allergies:     Allergies   Allergen Reactions     Lisinopril Hives    Metformin Hives       Medications:     Medications Prior to Admission   Medication Sig    amLODIPine (NORVASC) 5 MG tablet Take 1 tablet (5 mg total) by mouth every 12 (twelve) hours    atorvastatin (LIPITOR) 40 MG tablet Take 40 mg by mouth daily    carvedilol (COREG) 25 MG tablet Take 1 tablet (25 mg total) by mouth every 12 (twelve) hours    Dulaglutide (Trulicity) 6.28 BT/5.1VO Solution Pen-injector Inject 0.75 mg into the skin once a week    famotidine (PEPCID) 40 MG tablet Take 40 mg by mouth daily    gabapentin (NEURONTIN) 600 MG tablet Take 600 mg by mouth 3 (three) times daily    insulin aspart (NovoLOG) 100 UNIT/ML injection Inject 5 Units into the skin 3 (three) times daily before meals    insulin glargine (LANTUS) 100 UNIT/ML injection Inject 30 Units into the skin every 12 (twelve) hours    pantoprazole (PROTONIX) 40 MG tablet Take 1 tablet (40 mg total) by mouth 2 (two) times daily    collagenase (SANTYL) ointment Apply topically every 24 hours    lactobacillus/streptococcus (RISAQUAD) Cap Take 1 capsule by mouth daily    lidocaine (LIDODERM) 5 % Place 1 patch onto the skin every 24 hours Remove & Discard patch within 12 hours or as directed by MD    oxyCODONE (ROXICODONE) 5 MG immediate release tablet Take 1 tablet (5 mg total) by mouth every 6 (six) hours as needed for Pain    sevelamer (RENVELA) 800 MG tablet Take 800 mg by mouth 3 (three) times daily with meals    sodium hypochlorite (DAKIN'S QUARTER STRENGTH) 0.125 % Solution Irrigate with as directed daily    vitamin C (ASCORBIC ACID) 500 MG tablet Take 1 tablet (500 mg total) by mouth daily    zinc Oxide (DESITIN) 40 % Paste paste Apply topically every 6 (six) hours    zinc sulfate (ZINCATE) 220 (50 Zn) MG capsule Take 1 capsule (220 mg total) by mouth daily        Scheduled Meds: PRN Meds:    amLODIPine, 5 mg, Oral, Q12H  atorvastatin, 40 mg, Oral, Daily  carvedilol, 25 mg, Oral, Q12H  collagenase, ,  Topical, Q24H  gabapentin, 300 mg, Oral, Daily  heparin (porcine), 5,000 Units, Subcutaneous, Q8H SCH  insulin glargine, 30 Units, Subcutaneous, QHS  insulin lispro, 5 Units, Subcutaneous, TID MEALS  pantoprazole, 40 mg, Oral, BID  sevelamer, 800 mg, Oral, TID MEALS  sodium hypochlorite, , Irrigation, Daily  vitamin C, 500 mg, Oral, Daily  zinc sulfate, 220 mg, Oral, Daily          Continuous Infusions:   acetaminophen, 650 mg, Q6H PRN   Or  acetaminophen, 650 mg, Q6H PRN  alteplase, 2 mg, PRN  alteplase, 2 mg, PRN  dextrose, 15 g of glucose, PRN   And  dextrose, 12.5 g, PRN   And  glucagon (rDNA), 1 mg, PRN  melatonin, 3 mg, QHS PRN  naloxone, 0.2 mg, PRN  ondansetron, 4 mg, Q6H PRN   Or  ondansetron, 4 mg, Q6H PRN  oxyCODONE, 5 mg, Q6H PRN            Review of Systems:   A comprehensive review of systems was per the HPI and below:    General ROS: no f/c  HEENT ROS: no  blurry vision  Hematological and Lymphatic ROS: no known bleeding  Respiratory ROS: negative for cough, shortness of breath, or wheezing  Cardiovascular ROS: negative for chest pain or dyspnea on exertion  Gastrointestinal ROS: negative for abd/flank pain, change in bowel habits  Musculoskeletal ROS: s/p mechanical fall  Neurological ROS: no focal weakness, no dizziness  Dermatological ROS: no new rashes or lesions      Physical Exam:     Vitals:    07/01/19 1230 07/01/19 1245 07/01/19 1300 07/01/19 1310   BP: 118/58 120/58 125/56 124/58   Pulse: 69 66 67 69   Resp: 15 16 20 18    Temp:   98.4 F (36.9 C) 98.4 F (36.9 C)   TempSrc:       SpO2: 98% 96% 100% 100%   Weight:       Height:           Intake and Output Summary (Last 24 hours) at Date Time    Intake/Output Summary (Last 24 hours) at 07/01/2019 1457  Last data filed at 07/01/2019 1310  Gross per 24 hour   Intake 550 ml   Output 700 ml   Net -150 ml       Recent weights:  Weight Monitoring 06/23/2019 06/23/2019 06/23/2019 06/24/2019 06/26/2019 07/01/2019 07/01/2019   Height 157.5 cm - 157.5 cm - - 157.5 cm  -   Height Method Stated - - - - Stated -   Weight 106.142 kg 106.731 kg 106.7 kg 111.4 kg 111.449 kg 111.4 kg 105.235 kg   Weight Method Stated Standing Scale - Bed Scale Bed Scale Bed Scale Bed Scale   BMI (calculated) 42.9 kg/m2 - 43.1 kg/m2 - - 45 kg/m2 -       General: awake, alert, oriented x 3, no acute distress.  HEENT: sclera anicteric  Neck: supple  Cardiovascular: regular rate and rhythm  Lungs: clear to auscultation bilaterally, bilateral air entry, normal work of breathing  Abdomen: soft, non-tender, non-distended  Extremities:1+ edema  Neuro: A+O x 3, no gross motor/sensory deficits    Access:R IJ HDTC    Labs:     Recent Labs   Lab 07/01/19  0347 06/30/19  2256 06/28/19  0505   WBC 10.99* 13.48* 11.33*   Hgb 8.2* 9.1* 9.2*   Hematocrit 27.8* 30.8* 30.9*   Platelets 115* 153 184     Recent Labs   Lab 07/01/19  0347 06/30/19  2256 06/28/19  0505 06/26/19  0428 06/26/19  0428   Sodium 133* 132* 132*  More results in Results Review 136   Potassium 5.3* 5.3* 5.4*  More results in Results Review 4.2   Chloride 100 97* 96*  More results in Results Review 100   CO2 20* 20* 21*  More results in Results Review 23   BUN 73.0* 71.0* 55.0*  More results in Results Review 34.0*   Creatinine 6.6* 6.7* 5.3*  More results in Results Review 4.8*   Calcium 8.2* 8.9 8.2*  More results in Results Review 8.1*   Albumin  --  3.0*  --   --   --    Phosphorus  --   --   --   --  3.8   Glucose 226* 271* 496*  More results in Results Review 201*   EGFR 7.6 7.5 9.8  More results in Results Review 11.0   More results in Results Review = values in this interval not displayed.         cc:  Forest Becker, MD  Pcp, None, MD

## 2019-07-01 NOTE — OT Eval Note (Signed)
[facility]   Occupational Therapy Attempt Note    Patient:  [name] MRN#:  [id number]  Unit:  [facility] Room/Bed:  X1062/I9485-I      Occupational Therapy evaluation attempted on 07/01/2019 at 9:13 AM unable to complete secondary to Pt heading off unit for HD.  Will f/u as able in PM, 07/01/2019     Zacarias Pontes, OTR/L, CNT, [place], [place] [phone]  9:14 AM 07/01/2019

## 2019-07-01 NOTE — ED Triage Notes (Signed)
[name] is a 64 y.o. female c/o dizziness x24 hours. Pt was supposed to get HD today but the did not go to her house. Pt is very lethargic but easy to arouse. Pt stated that she was recently D/c but fell x2 on 6/6.    BP 144/66    Pulse 77    Temp 98 F (36.7 C) (Axillary)    Resp (!) 25    LMP  (LMP Unknown) Comment: post menopausal   SpO2 96%

## 2019-07-01 NOTE — H&P (Signed)
[facility]      Patient: [name]  Date: 06/30/2019   DOB: September 23, 1955  Admission Date: 06/30/2019   MRN: [id number]  Attending: Dr. Marcy Siren, Blair Hailey PA-C         Chief Complaint   Patient presents with    Dizziness    Fatigue    Fall      History Gathered From: Patient    HISTORY AND PHYSICAL     [name] is a 64 y.o. 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 Patient discharged home 06/29/19 after admission for RUE numbness/pain secondary to triquetral fracture with possible gout/pseudogout flare. At home patient with two mechanical falls, reports tripping over cane, dizziness, and L leg giving out. Endorses head trauma with fall as well as generalized body pain worse in R wrist and L leg. States she was not down long, received help from people in her building. Denies LOC, chest pain, SOB. Additionally patient was scheduled to receive dialysis today, but reports transport never arrived. When she followed up she was told there was a scheduling error.    Past Medical History:   Diagnosis Date    Chronic obstructive pulmonary disease     Diabetes mellitus     Hyperlipidemia     Hypertension     Sleep apnea 2018       Past Surgical History:   Procedure Laterality Date    EGD, BIOPSY N/A 05/14/2019    Procedure: EGD, BIOPSY;  Surgeon: Ronie Spies, MD;  Location: [facility];  Service: Gastroenterology;  Laterality: N/A;    EXPLORATORY LAPAROTOMY N/A 05/27/2019    Procedure: EXPLORATORY LAPAROTOMY;  Surgeon: Herbert Moors., MD;  Location: [facility];  Service: General;  Laterality: N/A;    JOINT REPLACEMENT      shoulder    JOINT REPLACEMENT      knee    OVARY SURGERY Left     Removal    REMOVAL, FOREIGN BODY N/A 05/27/2019    Procedure: REMOVAL, PERITONEAL DIALYSIS CATHETER;  Surgeon: Herbert Moors., MD;  Location: [facility];  Service: General;  Laterality: N/A;    TUNNELED CATH PLACEMENT ([place]) Bilateral 05/23/2019    Procedure: TUNNELED CATH PLACEMENT;  Surgeon: Jacques Earthly, MD;  Location: [facility];  Service: Interventional Radiology;  Laterality: Bilateral;       Prior to Admission medications    Medication Sig Start Date End Date Taking? Authorizing Provider   amLODIPine (NORVASC) 5 MG tablet Take 1 tablet (5 mg total) by mouth every 12 (twelve) hours 05/20/19  Yes Matiana, Rollen Sox, MD   atorvastatin (LIPITOR) 40 MG tablet Take 40 mg by mouth daily   Yes [provider]   carvedilol (COREG) 25 MG tablet Take 1 tablet (25 mg total) by mouth every 12 (twelve) hours 06/04/19  Yes Delfina Redwood, MD   Dulaglutide (Trulicity) 3.41 PF/7.9KW Solution Pen-injector Inject 0.75 mg into the skin once a week   Yes [provider]   famotidine (PEPCID) 40 MG tablet Take 40 mg by mouth daily   Yes [provider]   gabapentin (NEURONTIN) 600 MG tablet Take 600 mg by mouth 3 (three) times daily   Yes [provider]   insulin aspart (NovoLOG) 100 UNIT/ML injection Inject 5 Units into  the skin 3 (three) times daily before meals 06/29/19  Yes Tirrell, Ashley Mariner, MD   insulin glargine (LANTUS) 100 UNIT/ML injection Inject 30 Units into the skin every 12 (twelve) hours 06/29/19  Yes Tirrell, Ashley Mariner, MD   pantoprazole (PROTONIX) 40 MG tablet Take 1 tablet (40 mg total) by mouth 2 (two) times daily 05/20/19  Yes Matiana, Rollen Sox, MD   collagenase (SANTYL) ointment Apply topically every 24 hours 06/29/19   Tirrell, Ashley Mariner, MD   lactobacillus/streptococcus (RISAQUAD) Cap Take 1 capsule by mouth daily 06/04/19 07/04/19  Delfina Redwood, MD   lidocaine (LIDODERM) 5 % Place 1 patch onto the skin every 24 hours Remove & Discard patch within 12 hours or as directed by MD 06/12/19   Delfina Redwood, MD   oxyCODONE (ROXICODONE) 5 MG immediate release tablet Take 1  tablet (5 mg total) by mouth every 6 (six) hours as needed for Pain 06/04/19   Delfina Redwood, MD   sevelamer (RENVELA) 800 MG tablet Take 800 mg by mouth 3 (three) times daily with meals    [provider]   sodium hypochlorite (DAKIN'S QUARTER STRENGTH) 0.125 % Solution Irrigate with as directed daily 06/30/19   Tirrell, Ashley Mariner, MD   vitamin C (ASCORBIC ACID) 500 MG tablet Take 1 tablet (500 mg total) by mouth daily 06/30/19   Tirrell, Ashley Mariner, MD   zinc Oxide (DESITIN) 40 % Paste paste Apply topically every 6 (six) hours 06/04/19 07/04/19  Delfina Redwood, MD   zinc sulfate (ZINCATE) 220 (50 Zn) MG capsule Take 1 capsule (220 mg total) by mouth daily 06/30/19   Tirrell, Ashley Mariner, MD       Allergies   Allergen Reactions    Lisinopril Hives    Metformin Hives       CODE STATUS: DNR, support okay    PRIMARY CARE MD: Pcp, None, MD    Family History   Problem Relation Age of Onset    Diabetes Mother     Hypertension Mother     Diabetes Father     Hypertension Father     Diabetes Sister     Hypertension Sister     Diabetes Brother     Hypertension Brother     Diabetes Paternal Aunt     Diabetes Paternal Uncle        Social History     Tobacco Use    Smoking status: Never Smoker    Smokeless tobacco: Never Used   Substance Use Topics    Alcohol use: Never    Drug use: Never       REVIEW OF SYSTEMS   Positive for: dizziness, generalized pain, abdominal wound  Negative for: fever, chills, SOB, chest pain, N/V, LOC   All ROS completed and otherwise negative.    PHYSICAL EXAM     Vital Signs (most recent): BP 139/82    Pulse 75    Temp 98.4 F (36.9 C) (Oral)    Resp 17    LMP  (LMP Unknown) Comment: post menopausal   SpO2 98%   Constitutional: No apparent distress. Patient speaks freely in full sentences.   HEENT: NC/AT, Posterior scalp TTP. PERRL, no scleral icterus or conjunctival pallor, no nasal discharge, MMM  Neck: trachea midline, supple, no cervical or supraclavicular lymphadenopathy or  masses  Cardiovascular: RRR, normal S1 S2, no murmurs, gallops, palpable thrills, no JVD, Non-displaced PMI.  Respiratory: Normal rate. No retractions or increased work of breathing.  Clear to auscultation and percussion bilaterally.  Gastrointestinal: +BS, non-distended, soft, non-tender, no rebound or guarding, no hepatosplenomegaly  Genitourinary: no suprapubic or costovertebral angle tenderness  Musculoskeletal: ROM and motor strength grossly normal. +edema RUE, R wrist with brace in place. DP and radial pulses 2+ and symmetric.  Skin exam:  Purulent abdominal wound  Neurologic: EOMI, CN 2-12 grossly intact. no gross motor or sensory deficits  Psychiatric: AAOx3, affect and mood appropriate. The patient is alert, interactive, appropriate.  Capillary refill:  Normal    Exam done by Edwin Dada, PA on 07/01/19 at 2:36 AM      LABS & IMAGING     Recent Results (from the past 24 hour(s))   Glucose Whole Blood - POCT    Collection Time: 06/30/19 10:41 PM   Result Value Ref Range    Whole Blood Glucose POCT 262 (H) 70 - 100 mg/dL   Acetaminophen level    Collection Time: 06/30/19 10:56 PM   Result Value Ref Range    Acetaminophen Level <7 (L) 10 - 30 ug/mL   Ethanol (Alcohol) Level    Collection Time: 06/30/19 10:56 PM   Result Value Ref Range    Alcohol None Detected None Detected mg/dL   Salicylate level    Collection Time: 06/30/19 10:56 PM   Result Value Ref Range    Salicylate Level <5.7 (L) 15.0 - 30.0 mg/dL   CBC and differential    Collection Time: 06/30/19 10:56 PM   Result Value Ref Range    WBC 13.48 (H) 3.10 - 9.50 x10 3/uL    Hgb 9.1 (L) 11.4 - 14.8 g/dL    Hematocrit 30.8 (L) 34.7 - 43.7 %    Platelets 153 142 - 346 x10 3/uL    RBC 3.26 (L) 3.90 - 5.10 x10 6/uL    MCV 94.5 78.0 - 96.0 fL    MCH 27.9 25.1 - 33.5 pg    MCHC 29.5 (L) 31.5 - 35.8 g/dL    RDW 13 11 - 15 %    MPV 12.1 8.9 - 12.5 fL    Neutrophils 76.4 None %    Lymphocytes Automated 12.5 None %    Monocytes 9.0 None %    Eosinophils Automated  0.7 None %    Basophils Automated 0.1 None %    Immature Granulocytes 1.3 None %    Nucleated RBC 0.0 0.0 - 0.0 /100 WBC    Neutrophils Absolute 10.29 (H) 1.10 - 6.33 x10 3/uL    Lymphocytes Absolute Automated 1.69 0.42 - 3.22 x10 3/uL    Monocytes Absolute Automated 1.21 (H) 0.21 - 0.85 x10 3/uL    Eosinophils Absolute Automated 0.09 0.00 - 0.44 x10 3/uL    Basophils Absolute Automated 0.02 0.00 - 0.08 x10 3/uL    Immature Granulocytes Absolute 0.18 (H) 0.00 - 0.07 x10 3/uL    Absolute NRBC 0.00 0.00 - 0.00 x10 3/uL   Comprehensive metabolic panel    Collection Time: 06/30/19 10:56 PM   Result Value Ref Range    Glucose 271 (H) 70 - 100 mg/dL    BUN 71.0 (H) 7.0 - 19.0 mg/dL    Creatinine 6.7 (H) 0.6 - 1.0 mg/dL    Sodium 132 (L) 136 - 145 mEq/L    Potassium 5.3 (H) 3.5 - 5.1 mEq/L    Chloride 97 (L) 100 - 111 mEq/L    CO2 20 (L) 22 - 29 mEq/L    Calcium 8.9 8.5 - 10.5 mg/dL  Protein, Total 8.3 6.0 - 8.3 g/dL    Albumin 3.0 (L) 3.5 - 5.0 g/dL    AST (SGOT) 10 5 - 34 U/L    ALT 10 0 - 55 U/L    Alkaline Phosphatase 134 (H) 37 - 106 U/L    Bilirubin, Total 0.6 0.2 - 1.2 mg/dL    Globulin 5.3 (H) 2.0 - 3.6 g/dL    Albumin/Globulin Ratio 0.6 (L) 0.9 - 2.2    Anion Gap 15.0 5.0 - 15.0   COVID-19 (SARS-COV-2) ([place] [facility])    Collection Time: 06/30/19 10:56 PM    Specimen: Nasopharynx; Nasopharyngeal Swab   Result Value Ref Range    Purpose of COVID testing Screening     SARS-CoV-2 Specimen Source Nasopharyngeal     SARS CoV 2 Overall Result Negative    Hemolysis index    Collection Time: 06/30/19 10:56 PM   Result Value Ref Range    Hemolysis Index 8 0 - 18   GFR    Collection Time: 06/30/19 10:56 PM   Result Value Ref Range    EGFR 7.5        MICROBIOLOGY:  COVID-19 negative    IMAGING:  Upon my review:     XR Humerus Right AP Lateral    Result Date: 06/25/2019    Unremarkable right humerus. Alba Destine, MD  06/25/2019 7:26 PM    Wrist Right PA Lateral And Oblique    Result Date: 06/30/2019   4 views of the right wrist  were compared to a similar study from 06/25/2019. No new fractures seen. No malalignment. Soft tissue swelling present. Atherosclerosis. Lorretta Harp, DO  06/30/2019 11:28 PM    XR Wrist Right PA Lateral And Oblique    Result Date: 06/25/2019   Triquetral fracture. Alba Destine, MD  06/25/2019 7:28 PM    CT Head without Contrast    Result Date: 06/30/2019   No acute intracranial findings demonstrated. No acute skull fracture. Posterior scalp hematoma. Lorretta Harp, DO  06/30/2019 11:43 PM    CT Head WO Contrast    Result Date: 06/23/2019   No intra-axial mass lesion or hemorrhage is demonstrated. Steward Drone, MD  06/23/2019 1:26 PM    CT Cervical Spine without Contrast    Result Date: 06/30/2019  No evidence of acute cervical spine fracture or malalignment. Gillermina Phy, MD  06/30/2019 11:56 PM    MRI Brain WO Contrast    Result Date: 06/23/2019  No evidence of acute infarction, intracranial hemorrhage, mass effect or hydrocephalus. Gillermina Phy, MD  06/23/2019 8:04 PM    US Venous Up Extrem Duplex Dopp Uni Right    Result Date: 06/24/2019   Focal superficial vein thrombosis of the right cephalic vein in the antecubital fossa. No DVT. Graciella Freer, MD  06/24/2019 2:03 PM    CT Abdomen Pelvis W WO IV Contrast and W PO Contrast    Result Date: 06/09/2019   Small ill-defined collections of fluid anterior to the uterus and along the right posterolateral margin of the uterus may represent abscesses or loculated ascites in a patient with peritonitis Elyn Peers, MD  06/09/2019 9:13 AM        CARDIAC:  EKG Interpretation (upon my review):  NSR, similar to previous EKG    Markers:  Recent Labs   Lab 06/24/19  1100   Creatine Kinase (CK) 25*       EMERGENCY DEPARTMENT COURSE:  Orders Placed This Encounter   Procedures  Ice pack    COVID-19 (SARS-COV-2) ([place] [facility])    CT Head without Contrast    CT Cervical Spine without Contrast    Wrist Right PA Lateral And Oblique    Acetaminophen level    Ethanol (Alcohol) Level     Salicylate level    CBC and differential    Comprehensive metabolic panel    Hemolysis index    GFR    Basic Metabolic Panel    CBC without differential    Hemolysis index    GFR    Diet consistent carbohydrate and renal Protein restriction: 80 GM Protein    Telemetry 24 Hour Protocol    Vital signs    Pulse Oximetry    Progressive Mobility Protocol    Notify physician    NSG Communication: Glucose POCT order (PRN hypoglycemia)    I/O    Height    Weight    Skin assessment    Nursing communication: Adult Hypoglycemia Treatment Algorithm    Place sequential compression device    Maintain sequential compression device    Education: Activity    Education: Disease Process & Condition    Education: Pain Management    Education: Falls Risk    Orthostatic blood pressure    Notify Physician (Critical Blood Glucose Value)    Notify physician (Communication: Document Abnormal Blood Glucose)    Target Glucose Goals    Nursing communication    NSG Communication: Glucose POCT order (PRN hypoglycemia)    If NPO, give half NPH dose    NSG Communication: Glucose POCT order    NO CPR - Support OK    ED Unit Sec Comm Order    ED Unit Sec Comm Order    Consult to Wound Ostomy Continence Nurse to Evaluate and treat Reason for consult? Surgical Wound    OT eval and treat    PT evaluate and treat    Glucose Whole Blood - POCT    ECG 12 Lead    ECG 12 Lead    Wound Care Instructions Wound Type # 1    Saline lock IV    Admit to Observation    Fall precautions    Skin and pressure ulcer precautions    Supervise For Meals Frequency: All meals       ASSESSMENT & PLAN     [name] is a 64 y.o. female admitted under Observation with Dizziness.    Patient Active Hospital Problem List:     Dizziness (07/01/2019)  Orthostatic vitals  Monitor on Tele  PT/OT eval in am    Fall  R wrist x-ray without new fracture, CT head with posterior scalp hematoma, CT c-spine without fracture or  misalignment  Pain control  PT/OT eval    ESRD on dialysis  Hyperkalemia  Missed dialysis 06/30/19  Dr Shaune Pascal consulted in ED, dialysis in am    Abdominal post-op wound-nonhealing, purulent  Continue wound care    Uncontrolled DM2 with hyperglycemia  Lantus 30 units daily, Humalog 5 units TID    HTN  Continue amlodipine    HLD  Continue statin    History COPD  PRN duo-nebs    History of gastritis with hemorrhage  Continue home protonix    Anemia of CKD  Monitor CBC        Nutrition  Diabetic/Renal    DVT/VTE Prophylaxis  SCD/Heparin    Anticipated medical stability for discharge: TBD    Service status/Reason for ongoing hospitalization: dizziness, fall,  dialysis  Anticipated Discharge Needs: TBD    Claris Gladden    07/01/2019 3:09 AM

## 2019-07-01 NOTE — Progress Notes (Signed)
Established HD patient at [facility], Probation officer spoke with Ok Edwards @ [facility] who conf'd pt is MWF 1pm. H&P faxed for their files.  Writer will coordinate ROC when ready for d/c.     Freida Busman, HD Care Coordinator  [phone]

## 2019-07-01 NOTE — Progress Notes (Signed)
Pt arrived on unit from the ED around 0200  Pt A&Ox4 and VSS.  Pt oriented to room, unit, and call bell system.  White board filled out.  Head to toe performed.  Orders released and implemented.  Admission hx completed.  Pt left in room resting peacefully.  Will continue to monitor pt and perform hourly rounding.

## 2019-07-01 NOTE — Plan of Care (Signed)
Discharge Recommendation: SNF  DME Recommended for Discharge: Transport chair, [facility], [facility], Hospital bed    Is an Occupational Therapy Evaluation Indicated at this time? Yes, an OT evaluation is indicated at this time.    Treatment/Interventions: Exercise, Gait training, Neuromuscular re-education, Functional transfer training, LE strengthening/ROM, Endurance training, Patient/family training, Equipment eval/education, Bed mobility, Compensatory technique education  PT Frequency: 2-3x/wk     PMP Activity: Step 4 - Dangle at Bedside  Distance Walked (ft) (Step 6,7): 0 Feet  (Please See Therapy Evaluation for device and assistance level needed)    Goals:   Goals  Goal Formulation: With patient  Time for Goal Acheivement: By time of discharge  Goals: Select goal  Pt Will Go Supine To Sit: with stand by assist, to maximize functional mobility and independence  Pt Will Perform Sit To Supine: with stand by assist, to maximize functional mobility and independence  Pt Will Achieve Sitting Balance: 4/5 moves/returns trunkal midpoint 1-2 inches in multiple planes, to maximize functional mobility and independence(progressing)  Pt Will Perform Sit to Stand: with minimal assist, to maximize functional mobility and independence(progressing)  Pt Will Transfer Bed/Chair: with rolling walker, with moderate assist, to maximize functional mobility and independence

## 2019-07-01 NOTE — RRT Follow Up Note (Signed)
RRT called  With change of mental status, no sob, disoriented to self , day of birth, aware of in [place] hospital, limited command follow, able to move bilateral upper arm freely although right forearm with brace on. Able to move toes, unable to get leg off bed. Visual hallunciation noted.   Stat lab drawn, will get head CT stat. Pt remain in unit 25.

## 2019-07-01 NOTE — Plan of Care (Signed)
Problem: Moderate/High Fall Risk Score >5  Goal: Patient will remain free of falls  Outcome: Progressing  Flowsheets (Taken 07/01/2019 0200)  High (Greater than 13):   HIGH-Apply yellow "Fall Risk" arm band   HIGH-Initiate use of floor mats as appropriate   HIGH-Consider use of low bed     Problem: Safety  Goal: Patient will be free from injury during hospitalization  Outcome: Progressing  Goal: Patient will be free from infection during hospitalization  Outcome: Progressing     Problem: Compromised Tissue integrity  Goal: Damaged tissue is healing and protected  Outcome: Progressing  Flowsheets (Taken 07/01/2019 0553)  Damaged tissue is healing and protected:   Monitor/assess Braden scale every shift   Provide wound care per wound care algorithm   Relieve pressure to bony prominences for patients at moderate and high risk   Keep intact skin clean and dry

## 2019-07-01 NOTE — Consults (Signed)
Wound Ostomy Continence Consultation    Date Time: 07/01/19 4:33 PM  Patient Name: [name], [name]  Consulting Service: [place]  Reason for Consult: Surgical wound    [place] seen patient on 06/01 discharge and back on 06/30/19    Assessment:     Surgical wound  - Present on Admission ( POA) Wound measure approximately 8 cm x 5 cm x 3 cm. The wound bed is 100 %  yellow slough.  Scant serous drainage, no odor. Peri-wound is intact slightly erythema.      Plan: Continue current treatment- Dakin's and Santyl    Abdomen    1. Cleanse wound with Dakin's 1/4 strength. and pat it dry with gauze.    2. Apply thick nickel layer of Santyl to wound bed daily, lightly pack with moistened gauze Dakin's solution.   3. Top with a dry dressing. Protect the edges of a wound with 3 M cavilon no sting barrier film wipes/ spray and allow to dry.   4. Cover with an adhesive foam dressing.         Santyl is a debriding agent ointment containing papain and a proteolytic enzyme. Use to debride necrotic tissue and liquefy slough.     Dakin's to reduce the bacterial burden and promote healing. Effective against staph, strep, and Pseudomonas, dissolve necrotic tissue, controls odor.     Initiate/ continue pressure prevention bundle:     Head of the bed 30 degrees or less  Positioning device to the bedside  Eliminate/minimize pressure from the area  Float heels with boots or pillows  Turn patient  Pressure redistribution cushion to the chair  Use lift sheets/low friction surface sheets for positioning  Pad bony prominences  Nutrition consult/Optimize nutrition  Initiate bed algorithm/Specialty bed  Moisture/Incontinence management - Cleanse with incontinence cleansing wipe/water to manage incontinence and to protect skin from exposure to urine and stool. Apply skin barrier protection cream. Apply [place]/external catheter to prevent urinary contamination of the wound. Apply rectal pouch/fecal management system per unit policy, to  prevent fecal contamination of a wound or contain diarrhea.                      Wound care orders entered into EMR. Care rendered. [place] nurse service to follow    Objective Findings   Specialty Bed:     [place]    Braden Score: Braden Scale Score: 16 (07/01/19 0800)      Wound Assessment:   Wound 05/27/19 Surgical Incision Abdomen Other (Comment) 4x4, abd, tape  (Active)   Site Description Dressing covering site (UTA) 07/01/19 1516   Drainage Amount Large 07/01/19 0300   Drainage Description Serosanguinous 07/01/19 0300   Treatments Cleansed 07/01/19 0300   Dressing Foam;Gauze 07/01/19 0300   Dressing Changed New 07/01/19 0300              History of Presenting Illness   HPI:   [name] is a 64 y.o. 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 Patient discharged home 06/29/19 after admission for RUE numbness/pain secondary to triquetral fracture with possible gout/pseudogout flare. At home patient with two mechanical falls, reports tripping over cane, dizziness, and L leg giving out. Endorses head trauma with fall as well as generalized body pain worse in R wrist and  L leg. States she was not down long, received help from people in her building. Denies LOC, chest pain, SOB. Additionally patient was scheduled to receive dialysis today, but reports transport never arrived. When she followed up she was told there was a scheduling error, PA Beard.      Past Medical and Surgical History     Past Medical History:   Diagnosis Date    Chronic obstructive pulmonary disease     Diabetes mellitus     Hyperlipidemia     Hypertension     Sleep apnea 2018       Past Surgical History:   Procedure Laterality Date    EGD, BIOPSY N/A 05/14/2019    Procedure: EGD, BIOPSY;  Surgeon: Ronie Spies, MD;  Location: [facility];  Service:  Gastroenterology;  Laterality: N/A;    EXPLORATORY LAPAROTOMY N/A 05/27/2019    Procedure: EXPLORATORY LAPAROTOMY;  Surgeon: Herbert Moors., MD;  [facility];  Service: General;  Laterality: N/A;    JOINT REPLACEMENT      shoulder    JOINT REPLACEMENT      knee    OVARY SURGERY Left     Removal    REMOVAL, FOREIGN BODY N/A 05/27/2019    Procedure: REMOVAL, PERITONEAL DIALYSIS CATHETER;  Surgeon: Herbert Moors., MD;  [facility];  Service: General;  Laterality: N/A;    TUNNELED CATH PLACEMENT ([place]) Bilateral 05/23/2019    Procedure: TUNNELED CATH PLACEMENT;  Surgeon: Jacques Earthly, MD;  [facility];  Service: Interventional Radiology;  Laterality: Bilateral;     POD: @ORDAYSPST  @LOS :  LOS: 0 days         Lab   Significant Lab Values:    Recent Labs     07/01/19  0347 06/30/19  2256 06/30/19  2256   WBC 10.99*   < > 13.48*   RBC 2.90*   < > 3.26*   Hgb 8.2*   < > 9.1*   Hematocrit 27.8*   < > 30.8*   Sodium 133*   < > 132*   Potassium 5.3*   < > 5.3*   Chloride 100   < > 97*   CO2 20*   < > 20*   BUN 73.0*   < > 71.0*   Creatinine 6.6*   < > 6.7*   Calcium 8.2*   < > 8.9   Albumin  --   --  3.0*   EGFR 7.6   < > 7.5   Glucose 226*   < > 271*    < > = values in this interval not displayed.    Prealbumin: No components found for: PREALBUMIN  Culture     [name] BSN, RN, [employer]  Wound, Ostomy, and Continence Nurse Coordinator  [facility] [address]  [address] [place], [place] [address]  T [phone] S [phone]  [email or web address]

## 2019-07-01 NOTE — Plan of Care (Signed)
Discharge Recommendation: SNF   DME Recommended for Discharge: Transport chair, Hospital bed, [employer], BSC, Other (Comment)(possible R platform RW - req cont assessment)    OT Frequency Recommended: 2-3x/wk     Is PT evaluation indicated at this time? This patient is already on PT caseload.     PMP Activity: Step 4 - Dangle at Bedside  Distance Walked (ft) (Step 6,7): 0 Feet  (Please See Therapy Evaluation for device and assistance level needed)    Treatment Interventions: ADL retraining, Functional transfer training, Endurance training, Patient/Family training, Equipment eval/education, Continued evaluation       Goal Formulation: Patient  Time For Goal Achievement: by time of discharge  ADL Goals  Patient will dress upper body: Supervision, Not met  Patient will toilet: Moderate Assist, Not met  Mobility and Transfer Goals  Other Goal: Pt will tolerate OOB ADL and functional mobility trial within 2 visits in prep for safe [place] planning

## 2019-07-01 NOTE — OT Eval Note (Signed)
[facility]  Occupational Therapy Evaluation and Treatment    Patient: [name]  MRN#: [id number]   Unit: [place] [facility]  Bed: A2525/A2525-B    Time of Evaluation and Treatment:  Time Calculation  OT Received On: 07/01/19  Start Time: 1653  Stop Time: 1715  Time Calculation (min): 22 min    Chart Review and Collaboration with Care Team: 5 minutes, not included in above time.    Evaluation: 12 minutes  Treatment:  10 minutes    OT Visit Number: 1    Consult received for [name] for OT Evaluation and Treatment.  Patients medical condition is appropriate for Occupational therapy intervention at this time.    Activity Orders:  OT eval and treat and progressive mobility protocol    Precautions and Contraindications:  Precautions  Weight Bearing Status: (recent R wrist fx, utilized R wrist/hand NWB)  Other Precautions: consistent carb/renal diet, hemodialysis, falls, wound care    Personal Protective Equipment (PPE)  gloves, procedure mask and eye shield/covering    Medical Diagnosis:  Dizziness [R42]  ESRD (end stage renal disease) on dialysis [N18.6, Z99.2]    History of Present Illness:  [name] is a 64 y.o. female admitted on 06/30/2019 with  a primary hospital problem of dizziness after presenting s/p fall. Patient discharged home 06/29/19 after admission for RUE numbness/pain secondary to triquetral fracture with possible gout/pseudogout flare. At home patient with two mechanical falls, reports tripping over cane, dizziness, and L leg giving out. Endorses head trauma with fall as well as generalized body pain worse in R wrist and L leg. States she was not down long, received help from people in her building. Denies LOC, chest pain, SOB. Additionally patient was scheduled to receive dialysis but reports transport never arrived. When she followed up she was told there was a scheduling error. Pt was admitted and received dialysis, purulent abdominal wound dressing  changed, no new imaging findings.    Patient Active Problem List   Diagnosis    Iron deficiency anemia secondary to inadequate dietary iron intake    Sideropenic dysphagia    Peritoneal dialysis catheter dysfunction, initial encounter    Peritonitis    Upper GI bleed    ESRD (end stage renal disease) on dialysis    Generalized abdominal pain    Thrombocytopenia    Hypotension    Type 2 diabetes mellitus, with long-term current use of insulin    GERD (gastroesophageal reflux disease)    Bowel incontinence    Right sided numbness    Uncontrolled type 2 diabetes mellitus with hyperglycemia    Hypertension    Hyperlipidemia    History of COPD    Gastritis with hemorrhage    History of anemia due to chronic kidney disease    Open wound, abdominal wall, lateral, subsequent encounter    Cerebrovascular accident (CVA), unspecified mechanism    Gout    Dizziness    Fall        Past Medical/Surgical History:  Past Medical History:   Diagnosis Date    Chronic obstructive pulmonary disease     Diabetes mellitus     Hyperlipidemia     Hypertension     Sleep apnea 2018     Past Surgical History:   Procedure Laterality Date    EGD, BIOPSY N/A 05/14/2019    Procedure: EGD, BIOPSY;  Surgeon: Ronie Spies, MD;  Location: [facility];  Service: Gastroenterology;  Laterality: N/A;  EXPLORATORY LAPAROTOMY N/A 05/27/2019    Procedure: EXPLORATORY LAPAROTOMY;  Surgeon: Herbert Moors., MD;  Location: [facility];  Service: General;  Laterality: N/A;    JOINT REPLACEMENT      shoulder    JOINT REPLACEMENT      knee    OVARY SURGERY Left     Removal    REMOVAL, FOREIGN BODY N/A 05/27/2019    Procedure: REMOVAL, PERITONEAL DIALYSIS CATHETER;  Surgeon: Herbert Moors., MD;  Location: [facility];  Service: General;  Laterality: N/A;    TUNNELED CATH PLACEMENT ([place]) Bilateral 05/23/2019    Procedure: TUNNELED CATH PLACEMENT;  Surgeon: Jacques Earthly, MD;  Location: [facility];   Service: Interventional Radiology;  Laterality: Bilateral;        X-Rays/Tests/Labs  Lab Results   Component Value Date/Time    HGB 8.2 (L) 07/01/2019 03:47 AM    HCT 27.8 (L) 07/01/2019 03:47 AM    K 5.3 (H) 07/01/2019 03:47 AM    NA 133 (L) 07/01/2019 03:47 AM    INR 1.0 06/23/2019 12:29 PM    TROPI 0.02 06/23/2019 12:29 PM    TROPI 0.04 05/17/2019 06:20 PM    TROPI 0.05 05/17/2019 01:37 PM    TROPI 0.01 03/18/2006 06:10 PM       All imaging reviewed. Please see chart for details      Social History:  Prior Level of Function  Prior level of function: Ambulates with assistive device, Other (Comment)(pt reports that she does not know if she needs assist w/ ADL)  Assistive Device: Other (Comment)(rollator)  Baseline Activity Level: Household ambulation  DME Currently at Home: [employer], [employer], Other (Comment)(rollator)    Home Living Arrangements  Living Arrangements: Alone  Type of Home: [place]: One level, Elevator(flat entry)  Bathroom Toilet: (pt notes that she is waiting for her raised BSC to come)  Bathroom Equipment: Grab bars in shower  DME Currently at Home: [place], [employer], Other (Comment)(rollator)  Home Living - Notes / Comments: Pt notes that she has a HHA 2 times per week for wound care.      Subjective:      Patient is agreeable to participation in the therapy session. Nursing clears patient for therapy.   Patient Goal: To return to PLOF  Pain Assessment  Pain Assessment: Numeric Scale (0-10)  Pain Score: 10-severe pain  POSS Score: Awake and Alert  Pain Location: Wrist, Head, Leg  Pain Orientation: (BLE, R wrist)  Pain Frequency: Continuous  Pain Intervention(s): (RN aware)      Objective:      Observation of Patient/Vital Signs:BP 128/74    Pulse 80    Temp 98.4 F (36.9 C) (Oral)    Resp 17    Ht 1.575 m (5\' 2" )    Wt 105.2 kg (232 lb)    LMP  (LMP Unknown) Comment: post menopausal   SpO2 94%    BMI 42.43 kg/m       Cognitive Status and Neuro Exam:  Cognition/Neuro  Status  Arousal/Alertness: Appropriate responses to stimuli  Attention Span: Appears intact  Orientation Level: Oriented to place, Oriented to situation, Oriented to person  Memory: Appears intact  Following Commands: independent  Behavior: calm, cooperative  Motor Planning: intact  Coordination: intact    Musculoskeletal Examination  Gross ROM  Right Upper Extremity ROM: within functional limits(wrist, hand NT)  Left Upper Extremity ROM: within functional limits  Gross Strength  Right Upper  Extremity Strength: (shoulder at least 3/5 via gross obs)  Left Upper Extremity Strength: within functional limits       Sensory/Oculomotor Examination  Sensory  Auditory: intact  Tactile - Light Touch: intact         Activities of Daily Living  Self-care and Home Management  Eating: Setup, in bed  Toileting: Dependent(catheter)    Functional Mobility:  Mobility and Transfers  Scooting to [facility]: Dependent(x2 person A)  Supine to Sit: Minimal Assist, Increased Time, Increased Effort, using bedrail, HOB raised  Sit to Supine: Moderate Assist(x2 person A )  Sit to Stand: Unable to assess (Comment)(2/2 dizziness, pain, dec alertness at EOB)     Balance  Balance  Static Sitting Balance: fair  Dyanamic Sitting Balance: fair  Static Standing Balance: (UTA)    Participation and Activity Tolerance  Participation and Endurance  Participation Effort: good    Educated the patient to role of occupational therapy, plan of care, goals of therapy and safety with mobility and ADLs, weight bearing precautions, discharge instructions with verbalized understanding.    Patient left in bed with alarm and all other medical equipment in place and call bell and all personal items/needs within reach.  RN notified of session outcome.      Assessment:  RUCHEL BRANDENBURGER is a 64 y.o. female admitted 06/30/2019. Patient would benefit from continued skilled OT to address deficits listed below and increase functional independence.     OT Assessment: decreased  ROM;decreased strength;balance deficits;decreased independence with ADLs;decreased independence with IADLs;decreased endurance/activity tolerance      Treatment:  -Good carry over instruction for obs NWB R hand for functional mobility.  -Good carry over instruction for supine>sit with ext time 2/2 pain.  -Pt closing eyes and reporting ongoing dizziness once at EOB that does not resolve. Pt returned to supine and positioned for comfort. Pt unable to laterally scoot at EOB to reposition 2/2 dec strength. clin tech present to assist with transfer for pt safety.  -Discussed [place] rec, POC and encouraged cont mobility as tolerated with clin staff A. Pt verbalizes understanding.      Rehabilitation Potential: Prognosis: Good, With continued OT s/p acute discharge    Plan:  OT Frequency Recommended: 2-3x/wk   Treatment Interventions: ADL retraining, Functional transfer training, Endurance training, Patient/Family training, Equipment eval/education, Continued evaluation     PMP Activity: Step 4 - Dangle at Bedside  Distance Walked (ft) (Step 6,7): 0 Feet    Risks/benefits/POC discussed    Goals:  Time For Goal Achievement: by time of discharge  ADL Goals  Patient will dress upper body: Supervision, Not met  Patient will toilet: Moderate Assist, Not met  Mobility and Transfer Goals  Other Goal: Pt will tolerate OOB ADL and functional mobility trial within 2 visits in prep for safe [place] planning                             DME Recommended for Discharge: Transport chair, Hospital bed, [employer], [facility], Other (Comment)(possible R platform RW - req cont assessment)  Discharge Recommendation: SNF      Barnetta Chapel, [place], OTR/L [phone]  07/01/2019 5:21 PM

## 2019-07-01 NOTE — Plan of Care (Signed)
Problem: Safety  Goal: Patient will be free from injury during hospitalization  Flowsheets (Taken 07/01/2019 1208)  Patient will be free from injury during hospitalization:   Assess patient's risk for falls and implement fall prevention plan of care per policy   Use appropriate transfer methods   Include patient/ family/ care giver in decisions related to safety   Ensure appropriate safety devices are available at the bedside   Provide and maintain safe environment   [place] rounding   Assess for patients risk for elopement and implement Elopement Risk Plan per policy     Problem: Pain  Goal: Pain at adequate level as identified by patient  Flowsheets (Taken 07/01/2019 1208)  Pain at adequate level as identified by patient:   Identify patient comfort function goal   Assess for risk of opioid induced respiratory depression, including snoring/sleep apnea. Alert healthcare team of risk factors identified.   Assess pain on admission, during daily assessment and/or before any "as needed" intervention(s)   Reassess pain within 30-60 minutes of any procedure/intervention, per Pain Assessment, Intervention, Reassessment (AIR) Cycle   Evaluate if patient comfort function goal is met   Evaluate patient's satisfaction with pain management progress   Offer non-pharmacological pain management interventions     Problem: Renal Instability  Goal: Fluid and electrolyte balance are achieved/maintained  Flowsheets (Taken 07/01/2019 1208)  Fluid and electrolyte balance are achieved/maintained:   Monitor intake and output every shift   Assess for confusion/personality changes   Observe for seizure activity and initiate seizure precautions if indicated   Assess and reassess fluid and electrolyte status   Monitor/assess lab values and report abnormal values   Provide adequate hydration   Monitor daily weight   Observe for cardiac arrhythmias   Monitor for muscle weakness   Follow fluid restrictions/IV/PO parameters   Patient is alert and  oriented x 4.  No distress noted and no complaints voiced.  Plan of care discussed with patient, verbalizes understanding.  Transferred to dialysis this am.  Safety precautions in place and safety maintained.  Will continue to monitor.

## 2019-07-01 NOTE — Progress Notes (Signed)
Pt arrived on the unit at 1:58 alert and oriented x4. Did not complain of pain. Pt would fall asleep if I was not talking to her. Arrived with external cath in place, right chest shiely, right brace (fracture & hand swollen) and wound. Wound was cleaned and dressed before orders were placed because it was not clean at all and the dressing she came in with was saturated. Wound consult order placed. MD came to see pt. Insulin medications send to pharmacy. Glass pipes were found in pt's bag- per charge, its ok to let pt have it. Pt otherwise stable. No signs of distress. Will continue to monitor. Pt supposed to have dialysis today since she missed her session on Monday.

## 2019-07-01 NOTE — PT Eval Note (Signed)
[facility]  Physical Therapy Evaluation and Treatment    Patient: [name]  MRN#: [id number]  Unit: [place] INTERMEDIATE CARE  Bed: E0923/R0076-A    Time of Evaluation and Treatment:  Time Calculation  PT Received On: 07/01/19  Start Time: 2633  Stop Time: 1352  Time Calculation (min): 33 min    Evaluation Time: 10 minutes  Treatment Time: 23 minutes    Chart Review and Collaboration with Care Team: 5 minutes, not included in above time    PT Visit Number: 1    Consult received for [name] for PT Evaluation and Treatment secondary to dizziness/imbalance.  Patients medical condition is appropriate for Physical therapy intervention at this time.    Activity Orders:  PT eval and treat and progressive mobility protocol    Precautions and Contraindications:  Precautions  Weight Bearing Status: (recent R wrist fx, utilized R wrist/hand NWB)  Other Precautions: consistent carb/renal diet, hemodialysis, falls, wound care    Personal Protective Equipment (PPE)  gloves, procedure mask and eye shield/covering    Medical Diagnosis:  Dizziness [R42]  ESRD (end stage renal disease) on dialysis [N18.6, Z99.2]    History of Present Illness:  [name] is a 64 y.o. female admitted on 06/30/2019 with a primary hospital problem of dizziness after presenting s/p fall. Patient discharged home 06/29/19 after admission for RUE numbness/pain secondary to triquetral fracture with possible gout/pseudogout flare. At home patient with two mechanical falls, reports tripping over cane, dizziness, and L leg giving out. Endorses head trauma with fall as well as generalized body pain worse in R wrist and L leg. States she was not down long, received help from people in her building. Denies LOC, chest pain, SOB. Additionally patient was scheduled to receive dialysis but reports transport never arrived. When she followed up she was told there was a scheduling error. Pt was admitted and received dialysis, purulent  abdominal wound dressing changed, no new imaging findings.      Patient Active Problem List   Diagnosis    Iron deficiency anemia secondary to inadequate dietary iron intake    Sideropenic dysphagia    Peritoneal dialysis catheter dysfunction, initial encounter    Peritonitis    Upper GI bleed    ESRD (end stage renal disease) on dialysis    Generalized abdominal pain    Thrombocytopenia    Hypotension    Type 2 diabetes mellitus, with long-term current use of insulin    GERD (gastroesophageal reflux disease)    Bowel incontinence    Right sided numbness    Uncontrolled type 2 diabetes mellitus with hyperglycemia    Hypertension    Hyperlipidemia    History of COPD    Gastritis with hemorrhage    History of anemia due to chronic kidney disease    Open wound, abdominal wall, lateral, subsequent encounter    Cerebrovascular accident (CVA), unspecified mechanism    Gout    Dizziness    Fall       Past Medical/Surgical History:  Past Medical History:   Diagnosis Date    Chronic obstructive pulmonary disease     Diabetes mellitus     Hyperlipidemia     Hypertension     Sleep apnea 2018     Past Surgical History:   Procedure Laterality Date    EGD, BIOPSY N/A 05/14/2019    Procedure: EGD, BIOPSY;  Surgeon: Ronie Spies, MD;  Location: [facility];  Service: Gastroenterology;  Laterality:  N/A;    EXPLORATORY LAPAROTOMY N/A 05/27/2019    Procedure: EXPLORATORY LAPAROTOMY;  Surgeon: Herbert Moors., MD;  Location: [facility];  Service: General;  Laterality: N/A;    JOINT REPLACEMENT      shoulder    JOINT REPLACEMENT      knee    OVARY SURGERY Left     Removal    REMOVAL, FOREIGN BODY N/A 05/27/2019    Procedure: REMOVAL, PERITONEAL DIALYSIS CATHETER;  Surgeon: Herbert Moors., MD;  Location: [facility];  Service: General;  Laterality: N/A;    TUNNELED CATH PLACEMENT ([place]) Bilateral 05/23/2019    Procedure: TUNNELED CATH PLACEMENT;  Surgeon: Jacques Earthly, MD;  Location: [facility];  Service: Interventional Radiology;  Laterality: Bilateral;       X-Rays/Tests/Labs:  Lab Results   Component Value Date/Time    HGB 8.2 (L) 07/01/2019 03:47 AM    HCT 27.8 (L) 07/01/2019 03:47 AM    K 5.3 (H) 07/01/2019 03:47 AM    NA 133 (L) 07/01/2019 03:47 AM     All imaging reviewed, please see chart for details.    Social History:  Prior Level of Function  Prior level of function: Ambulates with assistive device, Other (Comment)(pt reports that she does not know if she needs assist w/ ADL)  Assistive Device: Other (Comment)(rollator)  Baseline Activity Level: Household ambulation  DME Currently at Home: [place], [employer], Other (Comment)(rollator)    Home Living Arrangements  Living Arrangements: Alone  Type of Home: [place]: One level, Elevator(flat entry)  Biochemist, clinical: (pt notes that she is waiting for her raised BSC to come)  Bathroom Equipment: Grab bars in shower  DME Currently at Home: [place], [employer], Other (Comment)(rollator)  Home Living - Notes / Comments: Pt notes that she has a HHA 2 times per week for wound care.  Pt w/ multiple falls.      Subjective:  Patient is agreeable to participation in the therapy session. Nursing clears patient for therapy.  Patient Goal: To get well  Pain Assessment  Pain Assessment: Numeric Scale (0-10)  Pain Score: 6-moderate pain(6/10-8/10)  POSS Score: Slightly drowsy, easily aroused  Pain Location: Generalized(incision/abdomen, headache, BL knees)  Pain Frequency: (LLE pain inc w/ movement; otherwise constant)  Effect of Pain on Daily Activities: moderate  Pain Intervention(s): Repositioned;Ambulation/increased activity;Distraction;Rest;Relaxation technique      Objective:  Observation of Patient/Vital Signs:  BP 124/58    Pulse 69   SpO2 100%    Cognitive Status and Neuro Exam:  Cognition/Neuro Status  Arousal/Alertness: Appropriate responses to stimuli  Safety Awareness: minimal verbal instruction  Insights:  Educated in Surveyor, mining;Fully aware of deficits  Behavior: cooperative;calm(easily fatigued)    Musculoskeletal Examination  Gross ROM  Right Lower Extremity ROM: within functional limits(for body habitus)  Left Lower Extremity ROM: (knee limited by 75% 2/2 pain; otherwise WFL for body habitus)  Gross Strength  Right Lower Extremity Strength: 3-/5  Left Lower Extremity Strength: 2+/5       Functional Mobility:  Functional Mobility  Supine to Sit: Minimal Assist;Increased Time;Increased Effort;using bedrail;HOB raised  Scooting to HOB: Minimal Assist;Increased Time;Increased Effort(scooting along EOB to HOB while seated)  Scooting to EOB: Contact Guard Assist;Increased Time;Increased Effort;using bedrail  Sit to Supine: Minimal Assist(w/ LEs;HOB elevated;increased time/effort;use of bedrail)  Sit to Stand: Maximal Assist;Increased Time;Increased Effort;with instruction for hand placement to increase safety(pt unable to clear buttocks from EOB)  Balance  Balance  Balance: needs focused assessment  Sitting - Static: Good(w/ 1 UE support)  Sitting - Dynamic: Fair(plus w/ 1 UE support)    Participation and Activity Tolerance  Participation and Endurance  Participation Effort: good  Endurance: Tolerates 30 min exercise with multiple rests  [facility] Dyspnea Scale: 0 Dyspnea      Assessment:  [name] is a 64 y.o. female admitted 06/30/2019. Pt would benefit from Physical Therapy to address deficits and increase functional independence.     PT Assessment  Assessment: Decreased LE ROM;Decreased LE strength;Decreased endurance/activity tolerance;Impaired motor control;Decreased functional mobility;Decreased balance;Gait impairment  Prognosis: Good;With continued PT status post acute discharge  Progress: Slow progress, decreased activity tolerance      Treatment:  Functional Mobility:  Sit to Stand: other (comment) performed second trial w/ bed slightly elevated, pt remained unable to achieve  buttock clearance, limited due to L knee pain; of note, large rest break provided between attempts 2/2 pain however pt motivated to participate in therapy and notes goal of being able to stand  Scooting to [facility]: Contact guard assist;Increased Time;Increased Effort(scooting along EOB to [facility], performed an additional 3 trials for improved positioning in bed prior to performance of sit->supine)    Balance:  Sitting Balance: sitting reaching activities, with instruction, with support and stand by assist     Therapeutic Exercise:  Ankle Pumps x5 BL    Education/Instruction/Safety:  Pt required increased time/effort and instruction for improved safety and technique t/o session.   All OOB activities and ambulation performed with use of gait belt for increased pt and therapist safety.    Discussed therapy plan of care while in the hospital and discharge recommendation/DME with pt verbalized agreement.   Encouraged continued functional mobility as tolerated with nursing staff assistance; pt verbalized understanding and agreement.  Educated the patient to role of physical therapy, plan of care, goals of therapy and HEP, safety with mobility and ADLs, energy conservation techniques, pursed lip breathing, discharge instructions, home safety, importance of sitting OOB in chair, benefits of continued mobility, reason for bed alarm, and use of call bell or telephone as needed with verbalized understanding.    Patient left in bed (repositioned for pressure relief and comfort) with alarm and all other medical equipment in place and call bell and all personal items/needs within reach.  RN notified of session outcome.      Plan:  Treatment/Interventions: Exercise, Gait training, Neuromuscular re-education, Functional transfer training, LE strengthening/ROM, Endurance training, Patient/family training, Equipment eval/education, Bed mobility, Compensatory technique education  PT Frequency: 2-3x/wk  Risks/Benefits/POC Discussed with  Pt/Family: With patient    PMP Activity: Step 4 - Dangle at Bedside  Distance Walked (ft) (Step 6,7): 0 Feet      Goals:  Goals  Goal Formulation: With patient  Time for Goal Acheivement: By time of discharge  Goals: Select goal  Pt Will Go Supine To Sit: with stand by assist, to maximize functional mobility and independence  Pt Will Perform Sit To Supine: with stand by assist, to maximize functional mobility and independence  Pt Will Achieve Sitting Balance: 4/5 moves/returns trunkal midpoint 1-2 inches in multiple planes, to maximize functional mobility and independence(progressing)  Pt Will Perform Sit to Stand: with minimal assist, to maximize functional mobility and independence(progressing)  Pt Will Transfer Bed/Chair: with rolling walker, with moderate assist, to maximize functional mobility and independence         DME Recommended for Discharge: Transport chair, [employer],  [facility], Hospital bed  Discharge Recommendation: SNF    [place], [place], [place] #[id number]  07/01/2019 2:16 PM

## 2019-07-01 NOTE — Progress Notes (Signed)
Handoff report received from [place]. RN; Arrived to unit a/ox3 , feeling sore  Secondary to fall  Last night. VSS; Rt. CVC patent w/ good flow. Will  Closely monitor progress while on HD .

## 2019-07-01 NOTE — Plan of Care (Signed)
Problem: Renal Instability  Goal: Fluid and electrolyte balance are achieved/maintained  07/01/2019 1336 by Mickel Baas, RN  Flowsheets (Taken 07/01/2019 1336)  Fluid and electrolyte balance are achieved/maintained:   Assess for confusion/personality changes   Monitor/assess lab values and report abnormal values   Assess and reassess fluid and electrolyte status   Monitor daily weight   Observe for cardiac arrhythmias   Monitor for muscle weakness  07/01/2019 1005 by Mickel Baas, RN  Flowsheets (Taken 07/01/2019 1005)  Fluid and electrolyte balance are achieved/maintained:   Monitor intake and output every shift   Monitor daily weight   Monitor/assess lab values and report abnormal values   Assess and reassess fluid and electrolyte status   Observe for cardiac arrhythmias   Follow fluid restrictions/IV/PO parameters  Goal: Nutritional intake is adequate  07/01/2019 1336 by Mickel Baas, RN  Flowsheets (Taken 07/01/2019 1336)  Nutritional intake is adequate:   Monitor daily weights   Consult/collaborate with Clinical Nutritionist   Assess anorexia, appetite, and amount of meal/food tolerated   Include patient/patient care companion in decisions related to nutrition  07/01/2019 1005 by Mickel Baas, RN  Flowsheets (Taken 07/01/2019 0945)  Nutritional intake is adequate:   Monitor daily weights   Assist patient with meals/food selection   Consult/collaborate with Clinical Nutritionist   Include patient/patient care companion in decisions related to nutrition   Assess anorexia, appetite, and amount of meal/food tolerated  Goal: Free from infection  07/01/2019 1336 by Mickel Baas, RN  Flowsheets (Taken 07/01/2019 1336)  Free from infection: Monitor/assess for signs and symptoms of infection  07/01/2019 1005 by Mickel Baas, RN  Flowsheets (Taken 07/01/2019 1005)  Free from infection: Monitor/assess for signs and symptoms of infection     Problem: Patient Receiving Advanced Renal Therapies  Goal: Therapy access site  remains intact  07/01/2019 1336 by Mickel Baas, RN  Flowsheets (Taken 07/01/2019 1336)  Therapy access site remains intact:   Assess therapy access site   Change therapy access site dressing as needed  Note: Pt. For possible exchange of catheter today.  07/01/2019 1005 by Mickel Baas, RN  Flowsheets (Taken 07/01/2019 1005)  Therapy access site remains intact:   Assess therapy access site   Change therapy access site dressing as needed

## 2019-07-01 NOTE — Progress Notes (Signed)
Tot. Of 2Hrs. HD tx. Done today due to Rt. CVC flow issue. Pt mostly run bet.  250-175 BFR; TPA indwelled x45 mins. As per MD's order- ineffective; MD aware. Will have catheter change possibly today  and for HD tx. Again tomorrow. Net fluid off 700cc; VSS. Rrport given to Prim. RN.     07/01/19 1310   Treatment Summary   Time Off Machine 1300   Duration of Treatment (Hours) 2.0   Treatment Type 1:1   Dialyzer Clearance Moderately streaked   Fluid Volume Off (mL) 1500   Prime Volume (mL) 200   Rinseback Volume (mL) 200   Fluid Given: Normal Saline (mL) 400   Fluid Given: PRBC  0 mL   Fluid Given: Albumin (mL) 0   Fluid Given: Other (mL) 0   Total Fluid Given 800   Hemodialysis Net Fluid Removed 700   Post Treatment Assessment   Post-Treatment Weight (Kg) 103.1   Patient Response to Treatment fairly tolerated   Information for Next Treatment for xchange of cath. today   Additional Dialyzer Used 0   Vitals   Temp 98.4 F (36.9 C)   Resp Rate 18   BP 124/58   SpO2 100 %   O2 Device None (Room air)   Assessment   Mental Status Alert;Oriented;Cooperative   Cardiac (WDL) WDL   Cardiac Regularity Regular   Cardiac Symptoms None   Cardiac Rhythm Normal Sinus Rhythm   Respiratory  WDL   Respiratory Pattern Regular   Bilateral Breath Sounds Clear;Diminished   Edema  WDL   Generalized Edema None   Facial None   Sacral UTA   RLE Edema +1   LLE Edema Non Pitting Edema   General Skin Color Appropriate for ethnicity   Skin Condition/Temp Warm;Dry   Gastrointestinal (WDL) WDL   Abdomen Inspection Soft;Nondistended   GI Symptoms None   Mobility Bed   Pain Assessment   Charting Type Assessment   Pain Scale Used Numeric Scale (0-10)   Numeric Pain Scale   Pain Score 6   POSS Score 1   Pain Location Generalized   Patient's Stated Comfort Functional Goal 1   Pain Intervention(s) Medication   Education   Person taught Patient   Knowledge basis Minimal   Topics taught Access care;K+   Teaching Tools Explain   Reponse Verbalizes  Understanding   Bedside Nurse Communication   Name of bedside RN - post dialysis Child psychotherapist

## 2019-07-01 NOTE — RRT Follow Up Note (Signed)
RAPID RESPONSE TEAM NOTE     [name] is a  64 y.o.  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     The Critical care/ Rapid Response Team was activated on 07/01/2019 at 2221 for AMS.     CC/ROS:  1. AMS:     PHYSICAL EXAMINATION;   VS: T 100.2. BP 153/75. P 81. SpO2 97% on RA  General appearance; NAD.   Mental status: Awake and alert, oriented to self only. Was oriented x 3 before. Not following neuro examination commands, however trying to follow.   Cardiac exam: S1,S2. No murmur/gallp or palpable thrill.  Pulmonary exam: CTAB  Peripheral pulses:  2+ L radial  Capillary refill:  Normal.   Skin exam: Abdominal wound with drainage.     Assessment/plan:  Patient Active Hospital Problem List: 07/01/19    # Acute encephalopathy: DDx: Infection vs central etiology with repeated fall/? hitting head vs renal failure. Glucose 309. RN expressed her concern for possible substance abuse.   # Fever:  100.2    -Will send blood culture, UA, wound culture and lactate.   -Will proceed with CXR.  -Will send drug screen. Alcohol was undetected yesterday.  -Holding Abx.  -Will give a dose of tylenol.  -Will check CBC, BMP, PT/INR.     -CT head stat.  -Fall precautions  -PT/OT  -Neuro checks  -Dysphagia screening.    * CT head; Per report, Stable left posterior parietal soft tissue swelling with no acute  intracranial process.  * CXR; Per report, No acute process  * Labs currently available; No drop in Hb, leukocytosis. Lactate not elevated. Glucose 322, already additional lispro given per RN, Mild elevated K 5.3, renal functions improved after today's dialysis. Other labs pending, primary attending to follow.   * Repeated VS: T 99.9. BP  112/68. RR 19, SpO2 94% on RA.  * Patient still confused.       Critical Care Time spent 50 min    Signed,  Corliss Skains Theseus Birnie

## 2019-07-01 NOTE — UM Notes (Signed)
UTILIZATION REVIEW CONTACT: Name:  Milas Gain MSN RN CCM   Utilization Review Case Manager    [facility]  Address:  [address] , [place] ,[place] [address]  NPI:   [id number]  Tax ID:  [id number]  Phone: [phone]  Fax: [phone]  Email: [email or web address]    Auth Number :  [id number]    ER ADMIT DATE AND TIME: 06/30/2019 10:25 PM  OBS admit:  07/01/19 0035      PATIENT NAME: [name],[name]  DOB: 11-Jul-1955      ADMISSION REVIEW   History of present illness: Pt is a 64 y.o. female arrived at [facility] on 06/30/2019 at 2225.    Complaints:    Chief Complaint   Patient presents with    Dizziness    Fatigue    Fall       The pt has a a PMHx of HTN, HLD, insulin dependent DM, COPD, ESRD on HD, peritoneal dialysis associated Psudomonas/E. Coli peritonitis, upper GI bleed secondary to gastritis 05/13/19 and anemia of CKD who presented with a fall. Patient discharged home 06/29/19 after admission for RUE numbness/pain secondary to triquetral fracture with possible gout/pseudogout flare. At home patient with two mechanical falls, reports tripping over cane, dizziness, and Young leg giving out. Endorses head trauma with fall as well as generalized body pain worse in R wrist and Young leg. States she was not down long, received help from people in her building. Denies LOC, chest pain, SOB. Additionally patient was scheduled to receive dialysis today, but reports transport never arrived. When she followed up she was told there was a scheduling error.      PMH:  has a past medical history of Chronic obstructive pulmonary disease, Diabetes mellitus, Hyperlipidemia, Hypertension, and Sleep apnea (2018).      DIAGNOSIS:     ICD-10-CM    1. ESRD (end stage renal disease) on dialysis  N18.6     Z99.2    2. Dizziness  R42          VS:   Vitals:    07/01/19 1100   BP: 127/60   Pulse: 71   Resp: 19   Temp:    SpO2: 95%          CURRENT LABS   WBC 10.99; H/H 27.8/8.2; PLT 115; RBC 2.90; GLUCOSE 226; BUN 73; CR 6.6 SODIUM  133; POTASSIUM 5.3; CO2 20; CALCIUM 8.2; PLT 115    Radiologic Studies -   Wrist Right PA Lateral And Oblique    Result Date: 06/30/2019   4 views of the right wrist were compared to a similar study from 06/25/2019. No new fractures seen. No malalignment. Soft tissue swelling present. Atherosclerosis. Lorretta Harp, DO  06/30/2019 11:28 PM    CT Head without Contrast    Result Date: 06/30/2019   No acute intracranial findings demonstrated. No acute skull fracture. Posterior scalp hematoma. Lorretta Harp, DO  06/30/2019 11:43 PM    CT Cervical Spine without Contrast    Result Date: 06/30/2019  No evidence of acute cervical spine fracture or malalignment. Gillermina Phy, MD  06/30/2019 11:56 PM         ED meds:   Date/Time Order Dose Route Action Action by    07/01/2019 0110 aspirin tablet 325 mg 325 mg Oral Given         MD NOTES:  H&P  UNKNOWN FLANNIGAN is a 64 y.o. female admitted under Observation with Dizziness.    Patient  Active Hospital Problem List:     Dizziness (07/01/2019)  Orthostatic vitals  Monitor on Tele  PT/OT eval in am    Fall  R wrist x-ray without new fracture, CT head with posterior scalp hematoma, CT c-spine without fracture or misalignment  Pain control  PT/OT eval    ESRD on dialysis  Hyperkalemia  Missed dialysis 06/30/19  Dr Shaune Pascal consulted in ED, dialysis in am    Abdominal post-op wound-nonhealing, purulent  Continue wound care    Uncontrolled DM2 with hyperglycemia  Lantus 30 units daily, Humalog 5 units TID    HTN  Continue amlodipine    HLD  Continue statin    History COPD  PRN duo-nebs    History of gastritis with hemorrhage  Continue home protonix    Anemia of CKD  Monitor CBC      Nutrition  Diabetic/Renal    DVT/VTE Prophylaxis  SCD/Heparin    Service status/Reason for ongoing hospitalization: dizziness, fall, dialysis    I/S: HEMODIALYSIS; DIABETIC/RENAL DIET; TELE; PT/OT ; WOUND CARE; FALL PRECAUTIONS     Current Medications:   Scheduled Meds:  Current Facility-Administered  Medications   Medication Dose Route Frequency    amLODIPine  5 mg Oral Q12H    atorvastatin  40 mg Oral Daily    carvedilol  25 mg Oral Q12H    collagenase   Topical Q24H    gabapentin  300 mg Oral Daily    heparin (porcine)  5,000 Units Subcutaneous Q8H [place]    insulin glargine  30 Units Subcutaneous QHS    insulin lispro  5 Units Subcutaneous TID MEALS    pantoprazole  40 mg Oral BID    sevelamer  800 mg Oral TID MEALS    sodium hypochlorite   Irrigation Daily    vitamin C  500 mg Oral Daily    zinc sulfate  220 mg Oral Daily     Continuous Infusions:  PRN Meds:.acetaminophen **OR** acetaminophen, alteplase, alteplase, Nursing communication: Adult Hypoglycemia Treatment Algorithm **AND** dextrose **AND** dextrose **AND** glucagon (rDNA), melatonin, naloxone, ondansetron **OR** ondansetron, oxyCODONE, sodium chloride, sodium chloride

## 2019-07-01 NOTE — ED Notes (Signed)
[place]  ED NURSING NOTE FOR THE RECEIVING INPATIENT NURSE   [facility]   [place] [address]   ED CHARGE RN [phone]   ADMISSION INFORMATION   [name] is a 64 y.o. female admitted with a diagnosis of:    1. Dizziness         Isolation: None   Allergies: Lisinopril and Metformin   Holding Orders confirmed? Yes   Belongings Documented? Yes   Home medications sent to pharmacy confirmed? N/a   NURSING CARE   Patient Comes From:   Mental Status: Home/Healthcare  lethargic   ADL: Needs assistance with ADLs   Ambulation: Unable to assess   Pertinent Information  and Safety Concerns: pt fell x2 at home, pt unable to void for staff     COVID Test sent to lab? Yes   VITAL SIGNS   Time BP Temp Pulse Resp SpO2   0446 130/60 98 73 16 97% RA   CT / NIH   CT Head ordered on this patient?  Yes   NIH/Dysphagia assessment done prior to admission? Yes   PERSONAL PROTECTIVE EQUIPMENT   Gloves   LAB RESULTS   Labs Reviewed   ACETAMINOPHEN LEVEL - Abnormal; Notable for the following components:       Result Value    Acetaminophen Level <7 (*)     All other components within normal limits   SALICYLATE LEVEL - Abnormal; Notable for the following components:    Salicylate Level <9.9 (*)     All other components within normal limits   CBC AND DIFFERENTIAL - Abnormal; Notable for the following components:    WBC 13.48 (*)     Hgb 9.1 (*)     Hematocrit 30.8 (*)     RBC 3.26 (*)     MCHC 29.5 (*)     Neutrophils Absolute 10.29 (*)     Monocytes Absolute Automated 1.21 (*)     Immature Granulocytes Absolute 0.18 (*)     All other components within normal limits   COMPREHENSIVE METABOLIC PANEL - Abnormal; Notable for the following components:    Glucose 271 (*)     BUN 71.0 (*)     Creatinine 6.7 (*)     Sodium 132 (*)     Potassium 5.3 (*)     Chloride 97 (*)     CO2 20 (*)     Albumin 3.0 (*)     Alkaline Phosphatase 134 (*)     Globulin 5.3 (*)     Albumin/Globulin Ratio 0.6 (*)     All other components within  normal limits   GLUCOSE WHOLE BLOOD - POCT - Abnormal; Notable for the following components:    Whole Blood Glucose POCT 262 (*)     All other components within normal limits   COVID-19 (SARS-COV-2)    Narrative:     Screening   ETHANOL   HEMOLYSIS INDEX   GFR          Ticket to Ride Printed: Yes

## 2019-07-02 LAB — CBC
Absolute NRBC: 0 10*3/uL (ref 0.00–0.00)
Hematocrit: 26.5 % — ABNORMAL LOW (ref 34.7–43.7)
Hgb: 7.8 g/dL — ABNORMAL LOW (ref 11.4–14.8)
MCH: 28 pg (ref 25.1–33.5)
MCHC: 29.4 g/dL — ABNORMAL LOW (ref 31.5–35.8)
MCV: 95 fL (ref 78.0–96.0)
MPV: 12.9 fL — ABNORMAL HIGH (ref 8.9–12.5)
Nucleated RBC: 0 /100 WBC (ref 0.0–0.0)
Platelets: 115 10*3/uL — ABNORMAL LOW (ref 142–346)
RBC: 2.79 10*6/uL — ABNORMAL LOW (ref 3.90–5.10)
RDW: 13 % (ref 11–15)
WBC: 10.01 10*3/uL — ABNORMAL HIGH (ref 3.10–9.50)

## 2019-07-02 LAB — RENAL FUNCTION PANEL
Albumin: 2.3 g/dL — ABNORMAL LOW (ref 3.5–5.0)
Anion Gap: 12 (ref 5.0–15.0)
BUN: 61 mg/dL — ABNORMAL HIGH (ref 7.0–19.0)
CO2: 22 mEq/L (ref 22–29)
Calcium: 8 mg/dL — ABNORMAL LOW (ref 8.5–10.5)
Chloride: 101 mEq/L (ref 100–111)
Creatinine: 5.4 mg/dL — ABNORMAL HIGH (ref 0.6–1.0)
Glucose: 229 mg/dL — ABNORMAL HIGH (ref 70–100)
Phosphorus: 4.3 mg/dL (ref 2.3–4.7)
Potassium: 5.1 mEq/L (ref 3.5–5.1)
Sodium: 135 mEq/L — ABNORMAL LOW (ref 136–145)

## 2019-07-02 LAB — HEMOLYSIS INDEX: Hemolysis Index: 0 (ref 0–18)

## 2019-07-02 LAB — TSH: TSH: 1.18 u[IU]/mL (ref 0.35–4.94)

## 2019-07-02 LAB — GLUCOSE WHOLE BLOOD - POCT
Whole Blood Glucose POCT: 176 mg/dL — ABNORMAL HIGH (ref 70–100)
Whole Blood Glucose POCT: 200 mg/dL — ABNORMAL HIGH (ref 70–100)
Whole Blood Glucose POCT: 276 mg/dL — ABNORMAL HIGH (ref 70–100)

## 2019-07-02 LAB — LACTIC ACID, PLASMA: Lactic Acid: 1.6 mmol/L (ref 0.2–2.0)

## 2019-07-02 LAB — MAGNESIUM: Magnesium: 1.9 mg/dL (ref 1.6–2.6)

## 2019-07-02 LAB — GFR: EGFR: 9.6

## 2019-07-02 LAB — HEMOLYSIS INDEX(SOFT): Hemolysis Index: 0 (ref 0–18)

## 2019-07-02 LAB — VITAMIN B12: Vitamin B-12: 354 pg/mL (ref 211–911)

## 2019-07-02 LAB — VITAMIN D,25 OH,TOTAL: Vitamin D, 25 OH, Total: 14 ng/mL — ABNORMAL LOW (ref 30–100)

## 2019-07-02 MED ORDER — ACETAMINOPHEN 500 MG PO TABS
1000.0000 mg | ORAL_TABLET | Freq: Three times a day (TID) | ORAL | Status: DC
Start: 2019-07-02 — End: 2019-07-12
  Administered 2019-07-02 – 2019-07-12 (×29): 1000 mg via ORAL
  Filled 2019-07-02 (×31): qty 2

## 2019-07-02 MED ORDER — HYDROMORPHONE HCL 1 MG/ML IJ SOLN
1.00 mg | INTRAMUSCULAR | Status: DC | PRN
Start: 2019-07-02 — End: 2019-07-06
  Administered 2019-07-04 – 2019-07-06 (×5): 1 mg via INTRAVENOUS
  Filled 2019-07-02 (×5): qty 1

## 2019-07-02 MED ORDER — HYDROMORPHONE HCL 1 MG/ML IJ SOLN
0.40 mg | INTRAMUSCULAR | Status: DC | PRN
Start: 2019-07-02 — End: 2019-07-07
  Administered 2019-07-07 (×2): 0.4 mg via INTRAVENOUS
  Filled 2019-07-02 (×2): qty 1

## 2019-07-02 MED ORDER — HEPARIN SODIUM (PORCINE) 5000 UNIT/ML IJ SOLN
5000.00 [IU] | Freq: Three times a day (TID) | INTRAMUSCULAR | Status: DC
Start: 2019-07-04 — End: 2019-07-18
  Administered 2019-07-04 – 2019-07-18 (×42): 5000 [IU] via SUBCUTANEOUS
  Filled 2019-07-02 (×43): qty 1

## 2019-07-02 MED ORDER — IHS HOLD MEDICATION FOR PROCEDURE
Status: AC | PRN
Start: 2019-07-02 — End: 2019-07-03
  Filled 2019-07-02: qty 1

## 2019-07-02 NOTE — Progress Notes (Signed)
[facility]  CV[facility]   Progress Note       64 y/o female with ESRD on HD MWF, with tunneled HD catheter placed in CVIR on 4/30. CVIR contacted due to malfunctioning dialysis catheter despite attempt with indwelling TPA. Patient was only able to receive 2 hours of HD yesterday. CVIR requested to exchange tunneled HD catheter.    PLAN:     Patient scheduled for tunneled HD catheter in CVIR tomorrow.   NPO after midnight.   Hold AM SQ Heparin dose.   Continue medical management per primary medicine team.    Donovan Kail, NP  [facility]  [phone]

## 2019-07-02 NOTE — Progress Notes (Signed)
64 y.o. 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    CM met with patient at bedside. Patient reports she lives alone in an elevator accessible apartment with no steps to enter. She ambulates with rollator assistance, is independent with ADLs. Receives her HD at [employer] [facility], [place]. Has home health services through [facility], RN visits 2x/wk to assist with wound care/dressing changes. State daughter will transport home.    DCP: Home with resumption of HH and outpt HD, daughter to transport     07/02/19 1638   Patient Type   Within 30 Days of Previous Admission? Yes   Healthcare Decisions   Interviewed: Patient   Orientation/Decision Making Abilities of Patient Alert and Oriented x3, able to make decisions   Advance Directive Patient does not have advance directive   Prior to admission   Prior level of function Independent with ADLs;Ambulates with assistive device   Type of Residence Private residence   Home Layout Elevator   Have running water, electricity, heat, etc? Yes   Living Arrangements Alone   How do you get to your MD appointments? Daughter, [employer] (Medicaid transport)   How do you get your groceries? Daughter   Who fixes your meals? Self, daughter   Who does your laundry? Self, daughter   Who picks up your prescriptions? Self, daughter   Dressing Independent   Grooming Independent   Feeding Independent   Bathing Independent   Toileting Independent   DME Currently at Home BP Cuff;Glucometer;Other (Comment)  Agricultural consultant)   Home Care/Community Services Home Health;Dialysis   Type of Dialysis: Hemodialysis   Where does patient receive dialysis? [facility]   How is patient transported to dialysis? Medicaid transport   What is weekly dialysis schedule? Mon-Wed-Fri   Name of [place]    Frequency of services 2x/wk for wound care   Discharge [place]   Patient expects to be discharged to: Home   Anticipated [place] plan discussed with: Same as interviewed   Mode of transportation: Private car (family member)   Does the patient have perscription coverage? Yes   Consults/Providers   PT Evaluation Needed 1   OT Evalulation Needed 1   SLP Evaluation Needed 2   Correct PCP listed in Epic? No (comment)  (No PCP)   Family and PCP   PCP on file was verified as the current PCP? Patient/family states they do not have a PCP   In case you are admitted, would like family notified? Yes   Name of family member to be notified Larene [place], daughter   Important Message from [facility] Notice   Patient received 1st IMM Letter? n/a  (Observation)

## 2019-07-02 NOTE — Progress Notes (Signed)
[facility] PROGRESS NOTE  Aaron Edelman, x [phone] ([place])      Date Time: 07/02/19 2:31 PM  Patient Name: [name]  Attending Physician: Forest Becker, MD    CC: follow-up CKD    Assessment:     1. CKD- on HD at [facility]  2. Malfunctioning dialysis catheter  3. Volume overload- mild  4. HTN  5. Anemia- of CKD  6. Secondary HPT  7. Abdominal surgical wound- not healing  8. Recurrent falls      Recommendations:     1. Permcath change today  2. HD in am  3. Daily renal panel, cbc  4. Continue Renvela  5. Check ipth  6. Wound care evaluation  7. Stable from  Renal standpoint.     Case discussed with: pt, HD RN   Ulyses Amor, MD  [facility]  [phone] (office)  [phone] [facility] Spectra[facility]Link)      Subjective: c/o pain in R wrist    Review of Systems:   Review of Systems - No CP, SOB, dizziness    Physical Exam:     Vitals:    07/02/19 0359 07/02/19 0723 07/02/19 0955 07/02/19 1110   BP: 141/78 122/74 121/70 121/70   Pulse: 76 68 90 68   Resp: 18 16  15    Temp: 98.8 F (37.1 C) 98.4 F (36.9 C)  98.1 F (36.7 C)   TempSrc: Oral Oral  Oral   SpO2: 95% 96%  96%   Weight:  111.1 kg (245 lb)     Height:           Intake and Output Summary (Last 24 hours) at Date Time    Intake/Output Summary (Last 24 hours) at 07/02/2019 1431  Last data filed at 07/02/2019 0723  Gross per 24 hour   Intake --   Output 0 ml   Net 0 ml       General: awake, alert, oriented x 3, no acute distress.  Cardiovascular: regular rate and rhythm, no murmurs, rubs or gallops  Lungs: clear to auscultation bilaterally, without wheezing, rhonchi, or rales  Abdomen: soft, non-tender, non-distended, normoactive bowel sounds; LLQ wound bandaged  Extremities: +1 LE  edema  Other:RIJ Permcath    Meds:      Scheduled Meds: PRN Meds:    amLODIPine, 5 mg, Oral, Q12H  atorvastatin, 40 mg, Oral, Daily  carvedilol, 25 mg, Oral, Q12H  collagenase, , Topical, Q24H  gabapentin, 300 mg, Oral, Daily  heparin  (porcine), 5,000 Units, Subcutaneous, Q8H [facility]  insulin glargine, 35 Units, Subcutaneous, QHS  insulin lispro, 5 Units, Subcutaneous, TID MEALS  pantoprazole, 40 mg, Oral, BID  sevelamer, 800 mg, Oral, TID MEALS  sodium hypochlorite, , Irrigation, Daily  vitamin C, 500 mg, Oral, Daily  zinc sulfate, 220 mg, Oral, Daily          Continuous Infusions:   acetaminophen, 650 mg, Q6H PRN   Or  acetaminophen, 650 mg, Q6H PRN  dextrose, 15 g of glucose, PRN   And  dextrose, 12.5 g, PRN   And  glucagon (rDNA), 1 mg, PRN  melatonin, 3 mg, QHS PRN  naloxone, 0.2 mg, PRN  ondansetron, 4 mg, Q6H PRN   Or  ondansetron, 4 mg, Q6H PRN  oxyCODONE, 5 mg, Q6H PRN              Labs:     Recent Labs   Lab 07/02/19  0357 07/01/19  2241 07/01/19  0347   WBC 10.01* 10.60* 10.99*   Hgb 7.8* 8.1* 8.2*   Hematocrit 26.5* 26.9* 27.8*   Platelets 115* 126* 115*     Recent Labs   Lab 07/02/19  0357 07/01/19  2241 07/01/19  0347 06/30/19  2256 06/30/19  2256 06/28/19  0505 06/26/19  0428   Sodium 135* 134* 133*  More results in Results Review 132*  More results in Results Review 136   Potassium 5.1 5.3* 5.3*  More results in Results Review 5.3*  More results in Results Review 4.2   Chloride 101 100 100  More results in Results Review 97*  More results in Results Review 100   CO2 22 22 20*  More results in Results Review 20*  More results in Results Review 23   BUN 61.0* 56.0* 73.0*  More results in Results Review 71.0*  More results in Results Review 34.0*   Creatinine 5.4* 5.2* 6.6*  More results in Results Review 6.7*  More results in Results Review 4.8*   Calcium 8.0* 7.9* 8.2*  More results in Results Review 8.9  More results in Results Review 8.1*   Albumin 2.3*  --   --   --  3.0*  --   --    Phosphorus 4.3  --   --   --   --   --  3.8   Magnesium 1.9  --   --   --   --   --   --    Glucose 229* 322* 226*  More results in Results Review 271*  More results in Results Review 201*   EGFR 9.6 10.1 7.6  More results in Results Review 7.5  More  results in Results Review 11.0   More results in Results Review = values in this interval not displayed.       Recent Labs   Lab 07/01/19  2241   Urine Type Urine, Clean Ca   Color, UA Yellow   Clarity, UA Hazy   Specific Gravity UA 1.015   Urine pH 6.0   Nitrite, UA Negative   Ketones UA Negative   Urobilinogen, UA Negative   Bilirubin, UA Negative   Blood, UA Negative   RBC, UA 3 - 5   WBC, UA 0 - 5           Imaging personally reviewed, including: CT Head WO Contrast    Result Date: 07/01/2019  Stable left posterior parietal soft tissue swelling with no acute intracranial process. Delaney Meigs, MD  07/01/2019 11:48 PM    XR Chest AP Portable    Result Date: 07/02/2019  No acute process. Delaney Meigs, MD  07/02/2019 12:36 AM          Signed by: Ulyses Amor, MD

## 2019-07-02 NOTE — UM Notes (Signed)
07/02/19 Concurrent Clinical Observation review    07/02/19 Medicine Assessment/Plan:  Mechanical fall  ESRD on HD  Malfunction of tunneled dialysis catheter  Transient alteration in mental status  Possible wound infection      N.p.o. at midnight  Exchange of tunneled dialysis catheter tomorrow  Pain control  SNF on discharge  Follow-up wound cultures  If T>100.4 for more than an hour or T>101 start broad-spectrum antibiotics overnight  Follow-up blood cultures        Analgesia: Acetaminophen, Dilaudid    Nutrition: Renal    DVT Prophylaxis: Heparin    Temp:  [98.1 F (36.7 C)-100.2 F (37.9 C)]   Heart Rate:  [68-90]   Resp Rate:  [15-19]   BP: (112-153)/(68-78)   SpO2:  [94 %-96 %]       07/02/2019 03:57  WBC: 10.01   Hemoglobin: 7.8   Hematocrit: 26.5     07/02/2019 03:57  Glucose: 229   BUN: 61.0   Creatinine: 5.4   Sodium: 135     07/02/2019 03:57  Calcium: 8.0     07/02/2019 03:57  Albumin: 2.3     07/02/2019 03:57  Vitamin D 25-OH Total: 14     Wound Stain, Gram        07/02/19  Moderate WBCs        Moderate Gram positive cocci        Moderate Gram negative rods        No Squamous epithelial cell    Scheduled Meds:  Current Facility-Administered Medications   Medication Dose Route Frequency    amLODIPine  5 mg Oral Q12H    atorvastatin  40 mg Oral Daily    carvedilol  25 mg Oral Q12H    collagenase   Topical Q24H    gabapentin  300 mg Oral Daily    heparin (porcine)  5,000 Units Subcutaneous Q8H [place]    insulin glargine  35 Units Subcutaneous QHS    insulin lispro  5 Units Subcutaneous TID MEALS    pantoprazole  40 mg Oral BID    sevelamer  800 mg Oral TID MEALS    sodium hypochlorite   Irrigation Daily    vitamin C  500 mg Oral Daily    zinc sulfate  220 mg Oral Daily

## 2019-07-02 NOTE — Progress Notes (Signed)
[facility]  PROGRESS NOTE      Patient: [name]  Date: 07/02/2019   LOS: 0 Days  Admission Date: 06/30/2019   MRN: [id number]  Attending: Forest Becker  Please contact me on the following Spectralink [phone]       ASSESSMENT/PLAN     [name] is a 64 y.o. female admitted with Dizziness    Interval Summary:     Active Hospital Problems    Diagnosis    Dizziness    Fall    Open wound, abdominal wall, lateral, subsequent encounter    Uncontrolled type 2 diabetes mellitus with hyperglycemia    Hypertension    Hyperlipidemia    History of COPD    History of anemia due to chronic kidney disease    ESRD (end stage renal disease) on dialysis       Mechanical fall  ESRD on HD  Malfunction of tunneled dialysis catheter  Transient alteration in mental status  Possible wound infection      N.p.o. at midnight  Exchange of tunneled dialysis catheter tomorrow  Pain control  SNF on discharge  Follow-up wound cultures  If T>100.4 for more than an hour or T>101 start broad-spectrum antibiotics overnight  Follow-up blood cultures        Analgesia: Acetaminophen, Dilaudid    Nutrition: Renal    DVT Prophylaxis: Heparin       Code Status: DNR    DISPO: Inpatient, discharge once tunneled catheter exchanged    Family Contact: Daughter       [place] states in pain, back of head, arm.  Wanting pain meds.    MEDICATIONS     Current Facility-Administered Medications   Medication Dose Route Frequency    acetaminophen  1,000 mg Oral TID    amLODIPine  5 mg Oral Q12H    atorvastatin  40 mg Oral Daily    carvedilol  25 mg Oral Q12H    collagenase   Topical Q24H    gabapentin  300 mg Oral Daily    heparin (porcine)  5,000 Units Subcutaneous Q8H [place]    [START ON 07/04/2019] heparin (porcine)  5,000 Units Subcutaneous Q8H [place]    insulin glargine  35 Units Subcutaneous QHS    insulin lispro  5 Units Subcutaneous TID MEALS    pantoprazole  40 mg Oral BID    sevelamer  800 mg Oral TID  MEALS    sodium hypochlorite   Irrigation Daily    vitamin C  500 mg Oral Daily    zinc sulfate  220 mg Oral Daily       PHYSICAL EXAM     Vitals:    07/02/19 1541   BP: 145/78   Pulse: 67   Resp: 17   Temp: 98.1 F (36.7 C)   SpO2: 96%       Temperature: Temp  Min: 98.1 F (36.7 C)  Max: 100.2 F (37.9 C)  Pulse: Pulse  Min: 67  Max: 90  Respiratory: Resp  Min: 15  Max: 19  Non-Invasive BP: BP  Min: 112/68  Max: 153/75  Pulse Oximetry SpO2  Min: 94 %  Max: 96 %    Intake and Output Summary (Last 24 hours) at Date Time    Intake/Output Summary (Last 24 hours) at 07/02/2019 1733  Last data filed at 07/02/2019 1541  Gross per 24 hour   Intake 240 ml   Output 0 ml  Net 240 ml         GEN APPEARANCE: Normal;  A&OX3  HEENT: PERLA; EOMI; Conjunctiva Clear  NECK: Supple; No bruits  CVS: RRR, S1, S2; No M/G/R  LUNGS: CTAB; No Wheezes; No Rhonchi: No rales  ABD: Soft; No TTP; + Normoactive BS  EXT: No edema; Pulses 2+ and intact  Skin exam:  pink  NEURO: CN 2-12 intact; No Focal neurological deficits  CAP REFILL:  Normal  MENTAL STATUS:  Normal    Exam done by Forest Becker, MD on 07/02/19 at 5:33 PM      LABS     Recent Labs   Lab 07/02/19  0357 07/01/19  2241 07/01/19  0347   WBC 10.01* 10.60* 10.99*   RBC 2.79* 2.86* 2.90*   Hgb 7.8* 8.1* 8.2*   Hematocrit 26.5* 26.9* 27.8*   MCV 95.0 94.1 95.9   Platelets 115* 126* 115*       Recent Labs   Lab 07/02/19  0357 07/01/19  2241 07/01/19  0347 06/30/19  2256 06/28/19  0505   Sodium 135* 134* 133* 132* 132*   Potassium 5.1 5.3* 5.3* 5.3* 5.4*   Chloride 101 100 100 97* 96*   CO2 22 22 20* 20* 21*   BUN 61.0* 56.0* 73.0* 71.0* 55.0*   Creatinine 5.4* 5.2* 6.6* 6.7* 5.3*   Glucose 229* 322* 226* 271* 496*   Calcium 8.0* 7.9* 8.2* 8.9 8.2*   Magnesium 1.9  --   --   --   --        Recent Labs   Lab 07/02/19  0357 06/30/19  2256   ALT  --  10   AST (SGOT)  --  10   Bilirubin, Total  --  0.6   Albumin 2.3* 3.0*   Alkaline Phosphatase  --  134*       Recent Labs   Lab  07/01/19  2241   Creatine Kinase (CK) 20*       Recent Labs   Lab 07/01/19  2241   PT INR 1.1   PT 12.7       Microbiology Results     Procedure Component Value Units Date/Time    COVID-19 (SARS-COV-2) [place] [facility]) [[id number]] Collected: 06/30/19 2256    Specimen: Nasopharyngeal Swab from Nasopharynx Updated: 06/30/19 2341     Purpose of COVID testing Screening     SARS-CoV-2 Specimen Source Nasopharyngeal     SARS CoV 2 Overall Result Negative     Comment: Test performed using the Abbott ID NOW EUA assay.  Please see Fact Sheets for patients and providers located at:  http://olson-hall.info/  This test is for the qualitative detection of SARS-CoV-2  (COVID19) nucleic acid. Viral nucleic acids may persist in vivo,  independent of viability. Detection of viral nucleic acid does  not imply the presence of infectious virus, or that virus  nucleic acid is the cause of clinical symptoms. Negative  results should be treated as presumptive and, if inconsistent  with clinical signs and symptoms or necessary for patient  management, should be tested with an alternative molecular  assay. Negative results do not preclude SARS-CoV-2 infection  and should not be used as the sole basis for patient  management decisions. Invalid results may be due to inhibiting  substances in the specimen and recollection should occur.         Narrative:      Screening    CULTURE BLOOD AEROBIC AND ANAEROBIC [[id number]] Collected:  07/01/19 2340    Specimen: Blood, Venipuncture Updated: 07/02/19 0454    Narrative:      1 BLUE+1 PURPLE    CULTURE BLOOD AEROBIC AND ANAEROBIC [[id number]] Collected: 07/01/19 2340    Specimen: Blood, Venipuncture Updated: 07/02/19 0454    Narrative:      1 BLUE+1 PURPLE    Culture + Gram Stain,Aerobic, Wound [[id number]] Collected: 07/02/19 0009    Specimen: Wound from Discharge Updated: 07/02/19 0602    Narrative:      ORDER#: [id number]                                    ORDERED BY: Sheppard Coil  SOURCE:  Discharge abdominal wound                    COLLECTED:  07/02/19 00:09  ANTIBIOTICS AT COLL.:                                RECEIVED :  07/02/19 04:57  Stain, Gram                                FINAL       07/02/19 06:02  07/02/19   Moderate WBCs             Moderate Gram positive cocci             Moderate Gram negative rods             No Squamous epithelial cells seen  Culture and Gram Stain, Aerobic, Wound     PENDING             Wrist Right PA Lateral And Oblique    Result Date: 06/30/2019   4 views of the right wrist were compared to a similar study from 06/25/2019. No new fractures seen. No malalignment. Soft tissue swelling present. Atherosclerosis. Lorretta Harp, DO  06/30/2019 11:28 PM    CT Head WO Contrast    Result Date: 07/01/2019  Stable left posterior parietal soft tissue swelling with no acute intracranial process. Delaney Meigs, MD  07/01/2019 11:48 PM    CT Head without Contrast    Result Date: 06/30/2019   No acute intracranial findings demonstrated. No acute skull fracture. Posterior scalp hematoma. Lorretta Harp, DO  06/30/2019 11:43 PM    CT Cervical Spine without Contrast    Result Date: 06/30/2019  No evidence of acute cervical spine fracture or malalignment. Gillermina Phy, MD  06/30/2019 11:56 PM    XR Chest AP Portable    Result Date: 07/02/2019  No acute process. Delaney Meigs, MD  07/02/2019 12:36 AM        Echo Results     None          RADIOLOGY     Upon my review:    Signed,  Forest Becker  5:33 PM 07/02/2019

## 2019-07-02 NOTE — Progress Notes (Signed)
At change of shift hand off pt was alert and oriented x4.  Pt's glucose was over 300, MD beard paged and ordered 5 unit humalog and because pt was going to be NPO post midnight, only 17 of lantus was given. When I was finally able to get back into her room, pt had a change in mental status. Vitals taken and stable. RRT was called at 1022. Pt disoriented to to person, time, situation. Pt was also having vision changes "looks like flies every where." pt's mental status fluctuated. What was done: labs, CT head, blood cultures, wound culture. Additional orders were placed: neuro q4, dysphagia screening, fall precautions. No signs of distress. Wound dressing changed. Will continue to monitor              07/01/19 2211   Charting Type   Charting Type Shift assessment   MEWS/LOC   Level of Consciousness Alert   Neurological   Neuro (WDL) X   Neurologic Status Awake   Orientation Level Disoriented to person;Disoriented to place;Disoriented to time;Disoriented to situation   Cognition Poor judgement;Poor safety awareness;Poor attention/concentration   Speech Clear   Dizziness No   Neglect/Extinction Absent   Pupil Assessment  Yes   R Pupil Size (mm) 2   R Pupil Shape Round   R Pupil Reaction Brisk   L Pupil Size (mm) 2   L Pupil Shape Round   L Pupil Reaction Brisk   Motor Function/Sensation Assessment Grip;Dorsiflexion;Plantar flexion;Motor response;Sensation;Motor strength   R Hand Grip Weak   L Hand Grip Weak    R Foot Dorsiflexion Weak   L Foot Dorsiflexion Weak   R Foot Plantar Flexion Weak   L Foot Plantar Flexion Weak   RUE Motor Response Responds to commands   RUE Sensation Full sensation   RUE Motor Strength UTA  (pt has fracture )   LUE Motor Response Responds to commands   LUE Sensation Full sensation   LUE Motor Strength Can overcome resistance   RLE Motor Response Responds to commands   RLE Sensation Full sensation   RLE Motor Strength Can overcome resistance   LLE Motor Response Responds to commands   LLE  Sensation Full sensation   LLE Motor Strength Normal power   Neuro Symptoms Fatigue   Relieved By Rest   Confusion Assessment Method (CAM)   FEATURE 1a:  Evidence of acute change in mental status?   1   FEATURE 1b:  Did the "abnormal" behavior fluctuate during the day? 0   FEATURE 2: Did the patient have difficulty focusing attention? 3   FEATURE 3: Was the patient's thinking disorganized or incoherent? .1   FEATURE 4: Overall, rate the patient's level of consciousness? 0   Positive or Negative for Delirium? Positive   Reflexes   Cough Present   Glasgow Coma Scale   Eye Opening 4   Best Verbal Response 4   Best Motor Response 6   Glasgow Coma Scale Score 14   NIH Stroke Scale   Interval Change in Status   Level of Consciousness (1a. ) 0   Question - age, month 2   Commands; Open and close eyes; Grip and release good hand 0   Gaze 0   Visual Fields 0   Facial Palsy 0   Motor Left Arm:  Arms (palm down) x 10 seconds sitting = 90 degrees supine = 45 degress 0   Motor Right Arm:  Arms (palm down) x 10 seconds sitting = 90 degrees  supine = 45 degress 0   Total Motor Arms 0   Motor Left Leg x 5 seconds supine = 30 degrees   (pt could not left leg up )   Motor Right  Leg x 5 seconds supine = 30 degrees 0   Limb Ataxia finger to nose 1   If present, check each limb with ataxia if present Left arm  (cannot check right arm )   Sensory to pin prick 0   Best language describe picture, name items 1   Dysarthria reads words from list 1   Extinction and Inattention 0   HEENT   HEENT (WDL) X   R Eye Impaired vision   L Eye Impaired vision   R Ear Intact   L Ear Intact   Nose Intact   Lips Symmetrical   Throat Intact   Tongue Pink & moist   Mucous Membrane(s) Moist;Pink   Teeth Missing teeth   Respiratory   Respiratory (WDL) X   Respiratory Pattern Regular   Chest Assessment Chest expansion symmetrical   Bilateral Breath Sounds Diminished   Cardiac   Cardiac (WDL) X   Cardiac Regularity Regular   Heart Sounds S1, S2   Jugular Venous  Distention (JVD) No   Cardiac Rhythm Normal Sinus Rhythm   Cardiac Symptoms None   Pacemaker/AICD   Pacemaker No   AICD No   Peripheral Vascular   Peripheral Vascular (WDL) X   Cyanosis None   Capillary Refill Less than 3 seconds (All extremities)   Pulses R radial;L radial;R pedal;L pedal   Edema Right upper extremity   Generalized Edema None   RUE Edema +2   Sacral UTA   RUE Neurovascular Assessment   R Radial Pulse +1   LUE Neurovascular Assessment   L Radial Pulse +2   RLE Neurovascular Assessment   R Dorsalis Pedis  Pulse +2   LLE Neurovascular Assessment   L Dorsalis Pedis Pulse +2   Integumentary   Integumentary (WDL) X   General Skin Color Appropriate for ethnicity   Skin Condition/Temp Warm;Dry   Skin Integrity Bruising   Bruising Skin Location BUE, abdomen    Braden Scale   Sensory Perceptions 3   Moisture 3   Activity 1   Mobility 2   Nutrition 3   Friction and Shear 3   Braden Scale Score 15   Braden Skin Interventions   Sensory Perception Interventions HOB @ 30 degrees or less unless contraindicated;Repositioned q2 hrs, keep pressure off injured skin   Moisture level Interventions Offer hourly toileting opportunities;Gently cleanse between deep skin folds (under pannus/pendulous breasts, abdominal folds ect) with packaged incontinence cloths containing barrier ointment   Activity/Mobility Interventions Keep HOB 30 degrees or less, unless contraindicated   Nutrition Interventions Monitored daily weight   Musculoskeletal   Musculoskeletal (WDL) X   RUE Limited movement;Brace   LUE Full movement   RLE Full movement   LLE Full movement   Gastrointestinal   Gastrointestinal (WDL) WDL   Abdomen Inspection Soft;Nondistended   Bowel Sounds (All Quadrants) Active;Present   Last BM Date 06/29/19   Passing Flatus Yes   GI Symptoms None   Stool   Bowel Incontinence No   Stool Appearance UTA   Genitourinary   Genitourinary (WDL) X   Genitourinary Symptoms None  (external cath )   Urine   Urinary Incontinence Yes    Urine Color Yellow/straw   Urine Appearance Cloudy   Urine Odor UTA   Psychosocial  Psychosocial (WDL) X   Patient Behaviors Calm;Cooperative  (altered mental status but pt is pleasant )   Needs Expressed Denies   Wound (LDAs)   Type of Wound (LDA) Wound

## 2019-07-02 NOTE — Progress Notes (Signed)
This note also relates to the following rows which could not be included:  MEWS Score (Read Only) - Cannot attach notes to rows marked as read only    Reassessed pt at 315 and pt is back to baseline. AOx4. Follows commands and answers appropriately. No vision issues Md updated      07/02/19 0320   Charting Type   Charting Type Reassessment   MEWS/LOC   Level of Consciousness Alert   Neurological   Neuro (WDL) X   Neurologic Status Awake   Orientation Level Oriented X4   Cognition Follows commands   Speech Clear   Dizziness No   Neglect/Extinction Absent   Pupil Assessment  Yes   R Pupil Size (mm) 2   R Pupil Shape Round   R Pupil Reaction Brisk   L Pupil Size (mm) 2   L Pupil Shape Round   L Pupil Reaction Brisk   Motor Function/Sensation Assessment Grip;Dorsiflexion;Plantar flexion;Motor response;Sensation;Motor strength   R Hand Grip Weak   L Hand Grip Weak    R Foot Dorsiflexion Moderate   L Foot Dorsiflexion Moderate   R Foot Plantar Flexion Moderate   L Foot Plantar Flexion Moderate   RUE Motor Response Responds to commands   RUE Sensation Full sensation   RUE Motor Strength UTA   LUE Motor Response Responds to commands   LUE Sensation Full sensation   LUE Motor Strength Normal power   RLE Motor Response Responds to commands   RLE Sensation Full sensation   RLE Motor Strength Can overcome resistance   LLE Motor Response Responds to commands   LLE Sensation Full sensation   LLE Motor Strength Normal power   Neuro Symptoms None   Confusion Assessment Method (CAM)   FEATURE 1a:  Evidence of acute change in mental status?   1   FEATURE 1b:  Did the "abnormal" behavior fluctuate during the day? 1   FEATURE 2: Did the patient have difficulty focusing attention? 0   FEATURE 3: Was the patient's thinking disorganized or incoherent? 0   FEATURE 4: Overall, rate the patient's level of consciousness? 0   Glasgow Coma Scale   Eye Opening 4   Best Verbal Response 5   Best Motor Response 6   Glasgow Coma Scale Score 15   NIH  Stroke Scale   Interval Change in Status   Level of Consciousness (1a. ) 0   Question - age, month 0   Commands; Open and close eyes; Grip and release good hand 0   Gaze 0   Visual Fields 0   Facial Palsy 0   Motor Left Arm:  Arms (palm down) x 10 seconds sitting = 90 degrees supine = 45 degress 0   Motor Right Arm:  Arms (palm down) x 10 seconds sitting = 90 degrees supine = 45 degress 0   Total Motor Arms 0   Motor Left Leg x 5 seconds supine = 30 degrees 0   Motor Right  Leg x 5 seconds supine = 30 degrees 0   Total Motor Legs 0   Limb Ataxia finger to nose 0   Sensory to pin prick 0   Best language describe picture, name items 0   Dysarthria reads words from list 0   Extinction and Inattention 0   NIHSS Total 0

## 2019-07-02 NOTE — Plan of Care (Signed)
Problem: Pain interferes with ability to perform ADL  Goal: Pain at adequate level as identified by patient  Flowsheets (Taken 07/01/2019 1208)  Pain at adequate level as identified by patient:   Identify patient comfort function goal   Assess for risk of opioid induced respiratory depression, including snoring/sleep apnea. Alert healthcare team of risk factors identified.   Assess pain on admission, during daily assessment and/or before any "as needed" intervention(s)   Reassess pain within 30-60 minutes of any procedure/intervention, per Pain Assessment, Intervention, Reassessment (AIR) Cycle   Evaluate if patient comfort function goal is met   Evaluate patient's satisfaction with pain management progress   Offer non-pharmacological pain management interventions     Problem: Neurological Deficit  Goal: Neurological status is stable or improving  Flowsheets (Taken 07/02/2019 1249)  Neurological status is stable or improving:   Monitor/assess/document neurological assessment (Stroke: every 4 hours)   Monitor/assess NIH Stroke Scale   Observe for seizure activity and initiate seizure precautions if indicated   Re-assess NIH Stroke Scale for any change in status   Perform CAM Assessment     Problem: Impaired Mobility  Goal: Mobility/Activity is maintained at optimal level for patient  Flowsheets (Taken 07/02/2019 1249)  Mobility/activity is maintained at optimal level for patient:   Increase mobility as tolerated/progressive mobility   Maintain proper body alignment   Plan activities to conserve energy, plan rest periods   Perform active/passive ROM   Encourage independent activity per ability   Reposition patient every 2 hours and as needed unless able to reposition self   Assess for changes in respiratory status, level of consciousness and/or development of fatigue     Problem: Every Day - Stroke  Goal: Neurological status is stable or improving  Flowsheets (Taken 07/02/2019 1249)  Neurological status is stable or  improving:   Monitor/assess/document neurological assessment (Stroke: every 4 hours)   Monitor/assess NIH Stroke Scale   Observe for seizure activity and initiate seizure precautions if indicated   Re-assess NIH Stroke Scale for any change in status   Perform CAM Assessment  Goal: Mobility/Activity is maintained at optimal level for patient  Flowsheets (Taken 07/02/2019 1249)  Mobility/activity is maintained at optimal level for patient:   Increase mobility as tolerated/progressive mobility   Maintain proper body alignment   Plan activities to conserve energy, plan rest periods   Perform active/passive ROM   Encourage independent activity per ability   Reposition patient every 2 hours and as needed unless able to reposition self   Assess for changes in respiratory status, level of consciousness and/or development of fatigue   Patient is alert and oriented x4.  No distress noted and no complaints voiced.  Plan of care discussed. Safety precautions in place.  No hallucinations or confusion noted.  NPO for tunneled dialysis catheter placement, will have dialysis today.

## 2019-07-03 ENCOUNTER — Encounter
Admission: EM | Disposition: A | Discharge status: Skilled Nursing Facility | Payer: Self-pay | Source: Home / Self Care | Attending: Internal Medicine

## 2019-07-03 HISTORY — PX: TUNNELED CATH CHECK/CHANGE (PERMCATH): IMG2663

## 2019-07-03 LAB — RENAL FUNCTION PANEL
Albumin: 2.3 g/dL — ABNORMAL LOW (ref 3.5–5.0)
Anion Gap: 13 (ref 5.0–15.0)
BUN: 70 mg/dL — ABNORMAL HIGH (ref 7.0–19.0)
CO2: 19 mEq/L — ABNORMAL LOW (ref 22–29)
Calcium: 8.1 mg/dL — ABNORMAL LOW (ref 8.5–10.5)
Chloride: 103 mEq/L (ref 100–111)
Creatinine: 5.9 mg/dL — ABNORMAL HIGH (ref 0.6–1.0)
Glucose: 294 mg/dL — ABNORMAL HIGH (ref 70–100)
Phosphorus: 4.7 mg/dL (ref 2.3–4.7)
Potassium: 6 mEq/L — ABNORMAL HIGH (ref 3.5–5.1)
Sodium: 135 mEq/L — ABNORMAL LOW (ref 136–145)

## 2019-07-03 LAB — CBC
Absolute NRBC: 0 10*3/uL (ref 0.00–0.00)
Hematocrit: 27.4 % — ABNORMAL LOW (ref 34.7–43.7)
Hgb: 8.3 g/dL — ABNORMAL LOW (ref 11.4–14.8)
MCH: 28.3 pg (ref 25.1–33.5)
MCHC: 30.3 g/dL — ABNORMAL LOW (ref 31.5–35.8)
MCV: 93.5 fL (ref 78.0–96.0)
MPV: 12.5 fL (ref 8.9–12.5)
Nucleated RBC: 0 /100 WBC (ref 0.0–0.0)
Platelets: 118 10*3/uL — ABNORMAL LOW (ref 142–346)
RBC: 2.93 10*6/uL — ABNORMAL LOW (ref 3.90–5.10)
RDW: 13 % (ref 11–15)
WBC: 13.27 10*3/uL — ABNORMAL HIGH (ref 3.10–9.50)

## 2019-07-03 LAB — MAGNESIUM: Magnesium: 1.8 mg/dL (ref 1.6–2.6)

## 2019-07-03 LAB — GLUCOSE WHOLE BLOOD - POCT
Whole Blood Glucose POCT: 244 mg/dL — ABNORMAL HIGH (ref 70–100)
Whole Blood Glucose POCT: 203 mg/dL — ABNORMAL HIGH (ref 70–100)
Whole Blood Glucose POCT: 141 mg/dL — ABNORMAL HIGH (ref 70–100)
Whole Blood Glucose POCT: 279 mg/dL — ABNORMAL HIGH (ref 70–100)

## 2019-07-03 LAB — HEMOLYSIS INDEX: Hemolysis Index: 6 (ref 0–18)

## 2019-07-03 LAB — GFR: EGFR: 8.7

## 2019-07-03 SURGERY — TUNNELED CATH CHECK/CHANGE

## 2019-07-03 MED ORDER — SODIUM CHLORIDE 0.9 % IV BOLUS
100.0000 mL | INTRAVENOUS | Status: AC | PRN
Start: 2019-07-03 — End: 2019-07-03

## 2019-07-03 MED ORDER — ALBUMIN HUMAN 25 % IV SOLN
100.0000 mL | INTRAVENOUS | Status: AC | PRN
Start: 2019-07-03 — End: 2019-07-03
  Filled 2019-07-03: qty 100

## 2019-07-03 MED ORDER — LIDOCAINE-EPINEPHRINE 1 %-1:100000 IJ SOLN
INTRAMUSCULAR | Status: AC | PRN
Start: 2019-07-03 — End: 2019-07-03
  Administered 2019-07-03: 10 mL

## 2019-07-03 MED ORDER — LIDOCAINE-EPINEPHRINE 1.5 %-1:200000 IJ SOLN
INTRAMUSCULAR | Status: AC
Start: 2019-07-03 — End: ?
  Filled 2019-07-03: qty 30

## 2019-07-03 MED ORDER — FENTANYL CITRATE (PF) 50 MCG/ML IJ SOLN (WRAP)
INTRAMUSCULAR | Status: AC | PRN
Start: 2019-07-03 — End: 2019-07-03
  Administered 2019-07-03: 25 ug via INTRAVENOUS

## 2019-07-03 MED ORDER — LIDOCAINE HCL 1 % IJ SOLN
INTRAMUSCULAR | Status: AC | PRN
Start: 2019-07-03 — End: 2019-07-03
  Administered 2019-07-03: 8 mL

## 2019-07-03 MED ORDER — SODIUM CHLORIDE 0.9 % IV BOLUS
250.0000 mL | INTRAVENOUS | Status: AC | PRN
Start: 2019-07-03 — End: 2019-07-03

## 2019-07-03 MED ORDER — FENTANYL CITRATE (PF) 50 MCG/ML IJ SOLN (WRAP)
INTRAMUSCULAR | Status: AC
Start: 2019-07-03 — End: ?
  Filled 2019-07-03: qty 2

## 2019-07-03 MED ORDER — LIDOCAINE HCL 1 % IJ SOLN
INTRAMUSCULAR | Status: AC
Start: 2019-07-03 — End: ?
  Filled 2019-07-03: qty 20

## 2019-07-03 MED ORDER — IODIXANOL 320 MG/ML IV SOLN
8.00 mL | Freq: Once | INTRAVENOUS | Status: AC
Start: 2019-07-03 — End: 2019-07-03
  Administered 2019-07-03: 14:00:00 8 mL via INTRAVENOUS

## 2019-07-03 SURGICAL SUPPLY — 8 items
CATHETER HEMODIALYSIS OD14.5 FR L28 CM (Catheter) ×1 IMPLANT
CATHETER HEMODIALYSIS OD14.5 FR L28 CM EQUISTREAM L23 CM STRAIGHT (Catheter) ×1 IMPLANT
CATHETER HMDIAL PU STRG EQUISTREAM ARGD (Catheter) ×2 IMPLANT
DEVICE TORQUE .025-.038 IN GUIDEWIRE (Procedure Accessories) ×1 IMPLANT
DEVICE TRQ .025-.038IN GW (Procedure Accessories) ×2
GUIDEWIRE VASC NTNL TUNG PU HDRPH GLDWR (Guidwire) ×2
GUIDEWIRE VASCULAR OD.035 IN L180 CM L3 (Guidwire) ×1 IMPLANT
GUIDEWIRE VASCULAR OD.035 IN L180 CM L3 CM GLIDEWIRE STANDARD ANGLE (Guidwire) ×1 IMPLANT

## 2019-07-03 NOTE — Plan of Care (Signed)
Problem: Compromised Tissue integrity  Goal: Damaged tissue is healing and protected  Outcome: Progressing  Flowsheets (Taken 07/03/2019 0134)  Damaged tissue is healing and protected:   Keep intact skin clean and dry   Use bath wipes, not soap and water, for daily bathing   Avoid shearing injuries     Problem: Renal Instability  Goal: Free from infection  Outcome: Progressing  Flowsheets (Taken 07/01/2019 1336 by Mickel Baas, RN)  Free from infection: Monitor/assess for signs and symptoms of infection     Problem: Patient Receiving Advanced Renal Therapies  Goal: Therapy access site remains intact  Outcome: Progressing  Flowsheets (Taken 07/01/2019 1336 by Mickel Baas, RN)  Therapy access site remains intact:   Assess therapy access site   Change therapy access site dressing as needed     Problem: Pain interferes with ability to perform ADL  Goal: Pain at adequate level as identified by patient  Outcome: Progressing  Flowsheets (Taken 07/03/2019 0134)  Pain at adequate level as identified by patient: Identify patient comfort function goal     Problem: Neurological Deficit  Goal: Neurological status is stable or improving  Outcome: Progressing     Problem: Every Day - Stroke  Goal: Neurological status is stable or improving  Outcome: Progressing     Problem: Fluid and Electrolyte Imbalance/ Endocrine  Goal: Fluid and electrolyte balance are achieved/maintained  Outcome: Progressing  Flowsheets (Taken 07/03/2019 0134)  Fluid and electrolyte balance are achieved/maintained:   Assess and reassess fluid and electrolyte status   Monitor/assess lab values and report abnormal values     Problem: Diabetes: Glucose Imbalance  Goal: Blood glucose stable at established goal  Outcome: Progressing  Flowsheets (Taken 07/03/2019 0134)  Blood glucose stable at established goal:   Assess for hypoglycemia /hyperglycemia   Follow fluid restrictions/IV/PO parameters

## 2019-07-03 NOTE — Interval H&P Note (Signed)
[facility]  [facility]   History and Physical     History and Physical:     The patient was interviewed and examined in the preoperative holding area. The previously documented history and physical on 07/01/19 was reviewed in detail and changes are none.  ASA Classification:   []    ASA 1  Healthy patient  []    ASA 2  Mild systemic illness  [x]    ASA 3  Systemic disease, though not incapacitating  []    ASA 4  Severe systemic disease that is a constant threat to life   []    ASA 5  Moribund condition, patient unexpected to live >24 hours, irrespective of procedure  []    E         Emergent procedure    Mallampati Score:   []  1  []  2  [x]  3  []  4  []  Intubated  []  Tracheostomy    Planned Anesthesia:   []  No sedation  [x]  Local  [x]  Moderate sedation  []  Deep sedation (with Anesthesiology present)  []  General Anesthesia     Airway Assesment:   [x]  Normal  [] Compromised    Diagnosis: ESRD, malfunctioning tunneled hemodialysis catheter  Procedure: Tunneled hemodialysis catheter replacement    The patient is medically stable and appropriate to undergo the planned procedure. All risks and alternatives were discussed with the patient and she agrees to proceed.     Signed by: Denzil Magnuson, NP  [place] [facility]  [phone]       I have evaluated the patient and agree with the above documented evaluation and plan.    Signed by: Jacques Earthly, MD  [facility] Department  [phone]

## 2019-07-03 NOTE — Plan of Care (Signed)
Problem: Compromised Tissue integrity  Goal: Damaged tissue is healing and protected  Outcome: Progressing  Goal: Nutritional status is improving  Outcome: Progressing     Problem: Renal Instability  Goal: Fluid and electrolyte balance are achieved/maintained  Outcome: Progressing  Goal: Perineal skin integrity is maintained or improved  Outcome: Progressing  Goal: Free from infection  Outcome: Progressing     Problem: Patient Receiving Advanced Renal Therapies  Goal: Therapy access site remains intact  Outcome: Progressing     Problem: Pain interferes with ability to perform ADL  Goal: Pain at adequate level as identified by patient  Outcome: Progressing     Problem: Side Effects from Pain Analgesia  Goal: Patient will experience minimal side effects of analgesic therapy  Outcome: Progressing     Problem: Neurological Deficit  Goal: Neurological status is stable or improving  Outcome: Progressing     Problem: Potential for Aspiration  Goal: Risk of aspiration will be minimized  Outcome: Progressing     Problem: Peripheral Neurovascular Impairment  Goal: Extremity color, movement, sensation are maintained or improved  Outcome: Progressing     Problem: Compromised Hemodynamic Status  Goal: Vital signs and fluid balance maintained/improved  Outcome: Progressing     Problem: Impaired Mobility  Goal: Mobility/Activity is maintained at optimal level for patient  Outcome: Progressing     Problem: Nutrition  Goal: Nutritional intake is adequate  Outcome: Progressing     Problem: Anxiety  Goal: Anxiety is at a manageable level  Outcome: Progressing     Problem: Every Day - Stroke  Goal: Neurological status is stable or improving  Outcome: Progressing  Goal: Mobility/Activity is maintained at optimal level for patient  Outcome: Progressing  Goal: Nutritional intake is adequate  Outcome: Progressing  Goal: Core/Quality measure requirements - Daily  Outcome: Progressing  Goal: Stable vital signs and fluid balance  Outcome:  Progressing  Goal: Patient will maintain adequate oxygenation  Outcome: Progressing  Goal: Patient's risk of aspiration will be minimized  Outcome: Progressing  Goal: Elimination patterns are normal or improving  Outcome: Progressing  Goal: Skin integrity is maintained or improved  Outcome: Progressing  Goal: Neurovascular status is stable or improving  Outcome: Progressing  Goal: Effective coping demonstrated  Outcome: Progressing  Goal: Will be able to express needs and understand communication  Outcome: Progressing     Problem: Fluid and Electrolyte Imbalance/ Endocrine  Goal: Fluid and electrolyte balance are achieved/maintained  Outcome: Progressing  Goal: Adequate hydration  Outcome: Progressing     Problem: Nutrition  Goal: Nutritional intake is adequate  Outcome: Progressing  Goal: Patient maintains weight  Outcome: Progressing  Goal: Food and/or nutrient delivery  Outcome: Progressing     Problem: Diabetes: Glucose Imbalance  Goal: Blood glucose stable at established goal  Outcome: Progressing

## 2019-07-03 NOTE — Progress Notes (Signed)
[facility] PROGRESS NOTE  Aaron Edelman, x [phone] (Vivian)      Date Time: 07/03/19 6:39 AM  Patient Name: [name]  Attending Physician: Forest Becker, MD    CC: follow-up CKD    Assessment:     1. CKD- on HD at [facility]  2. Malfunctioning dialysis catheter  3. Volume overload- mild  4. HTN  5. Anemia- of CKD  6. Secondary HPT  7. Abdominal surgical wound- not healing  8. Recurrent falls      Recommendations:     Scheduled for exchange of PermCath today  Dialysis after new PermCath in place  Stable for discharge after dialysis    Case discussed with: pt, HD RN   Armando Reichert, MD  [facility]  [phone] (office)  X [phone] ([facility] [employer])      Subjective: Comfortable and has no complaints    Review of Systems:   Review of Systems - No CP, SOB, dizziness    Physical Exam:     Vitals:    07/02/19 1909 07/02/19 2222 07/02/19 2312 07/03/19 0412   BP: 126/74 122/74 138/72 147/81   Pulse: 65 81 72 88   Resp: 20  20 20    Temp: 97.7 F (36.5 C)  98.2 F (36.8 C) 98.6 F (37 C)   TempSrc: Oral  Oral Oral   SpO2: 95% 95% 95% 91%   Weight:       Height:           Intake and Output Summary (Last 24 hours) at Date Time    Intake/Output Summary (Last 24 hours) at 07/03/2019 0639  Last data filed at 07/02/2019 1541  Gross per 24 hour   Intake 240 ml   Output 0 ml   Net 240 ml       General: awake, alert, oriented x 3, no acute distress.  Cardiovascular: regular rate and rhythm, no murmurs, rubs or gallops  Lungs: clear to auscultation bilaterally, without wheezing, rhonchi, or rales  Abdomen: soft, non-tender, non-distended, normoactive bowel sounds; LLQ wound bandaged  Extremities: +1 LE  edema  Other:RIJ Permcath    Meds:      Scheduled Meds: PRN Meds:    acetaminophen, 1,000 mg, Oral, TID  amLODIPine, 5 mg, Oral, Q12H  atorvastatin, 40 mg, Oral, Daily  carvedilol, 25 mg, Oral, Q12H  collagenase, , Topical, Q24H  gabapentin, 300 mg, Oral, Daily  [START ON  07/04/2019] heparin (porcine), 5,000 Units, Subcutaneous, Q8H SCH  insulin glargine, 35 Units, Subcutaneous, QHS  insulin lispro, 5 Units, Subcutaneous, TID MEALS  pantoprazole, 40 mg, Oral, BID  sevelamer, 800 mg, Oral, TID MEALS  sodium hypochlorite, , Irrigation, Daily  vitamin C, 500 mg, Oral, Daily  zinc sulfate, 220 mg, Oral, Daily          Continuous Infusions:   *HOLD MEDICATION FOR PROCEDURE*      *HOLD MEDICATION FOR PROCEDURE*, , Continuous PRN  dextrose, 15 g of glucose, PRN   And  dextrose, 12.5 g, PRN   And  glucagon (rDNA), 1 mg, PRN  HYDROmorphone, 0.4 mg, Q3H PRN  HYDROmorphone, 1 mg, Q3H PRN  melatonin, 3 mg, QHS PRN  naloxone, 0.2 mg, PRN  ondansetron, 4 mg, Q6H PRN   Or  ondansetron, 4 mg, Q6H PRN  oxyCODONE, 5 mg, Q6H PRN              Labs:     Recent Labs   Lab 07/02/19  0357 07/01/19  2241 07/01/19  0347   WBC 10.01* 10.60* 10.99*   Hgb 7.8* 8.1* 8.2*   Hematocrit 26.5* 26.9* 27.8*   Platelets 115* 126* 115*     Recent Labs   Lab 07/02/19  0357 07/01/19  2241 07/01/19  0347 06/30/19  2256 06/30/19  2256   Sodium 135* 134* 133*  More results in Results Review 132*   Potassium 5.1 5.3* 5.3*  More results in Results Review 5.3*   Chloride 101 100 100  More results in Results Review 97*   CO2 22 22 20*  More results in Results Review 20*   BUN 61.0* 56.0* 73.0*  More results in Results Review 71.0*   Creatinine 5.4* 5.2* 6.6*  More results in Results Review 6.7*   Calcium 8.0* 7.9* 8.2*  More results in Results Review 8.9   Albumin 2.3*  --   --   --  3.0*   Phosphorus 4.3  --   --   --   --    Magnesium 1.9  --   --   --   --    Glucose 229* 322* 226*  More results in Results Review 271*   EGFR 9.6 10.1 7.6  More results in Results Review 7.5   More results in Results Review = values in this interval not displayed.       Recent Labs   Lab 07/01/19  2241   Urine Type Urine, Clean Ca   Color, UA Yellow   Clarity, UA Hazy   Specific Gravity UA 1.015   Urine pH 6.0   Nitrite, UA Negative   Ketones UA  Negative   Urobilinogen, UA Negative   Bilirubin, UA Negative   Blood, UA Negative   RBC, UA 3 - 5   WBC, UA 0 - 5           Imaging personally reviewed, including: No results found.        Signed by: Armando Reichert, MD

## 2019-07-03 NOTE — Brief Op Note (Signed)
[facility]  [facility]   Brief Op Note       Physician(s): Berline Lopes, MD    Assistant(s):  None    Pre-operative Diagnosis: malfunctioning tunneled dialysis catheter    Post-operative Diagnosis: Diagnosis is same as preoperative diagnosis    Procedure(s) Performed:   1. Dialysis catheter venogram  2. Right IJ tunneled dialysis catheter exchange                                                                                                  Anesthesia:  Local with 1% Lidocaine    Complications: None    Estimated Blood Loss:  None    Blood Aministered:  None    Fluid Aministered:  Per Nursing    Tubes and Drains: None    Implant(s): 23cm equistream tunneled dialysis catheter    Specimens: None    Findings:  Tunneled dialysis catheter exchange    Patient was transferred from the procedure room to the AR[facility] in stable condition.  Procedure note to be dictated.    Signed by: Berline Lopes, MD  [place] [facility]  [place] [phone]  [facility] [phone]

## 2019-07-03 NOTE — Sedation Documentation (Addendum)
Pt to [facility] A lab for tunneled catheter check/possible change with Dr Lavone Neri. Pre procedure pause completed, all in agreement. Pt transferred to table and laying in supine position. Existing catheter changed out for new #14.66F, 23cm catheter and sutured. Pt tolerated procedure well. VSS. 75 mcg fentanyl given. Pt transferred to . Report called to floor RN.

## 2019-07-03 NOTE — Plan of Care (Signed)
Patient A&O4, but forgetful and drowsy throughout shift. VSS. Patient complained of aching pain in abdomen at infection site. All medications administered as ordered. Wound care completed on abdominal site; new dressing placed. Patient had new tunneled cath placed today. Patient received dialysis for 3.5 hours, 2.6L removed. Q4 neuro exams completed. Safe environment in place, call light within reach. Will continue with plan of care.     Problem: Compromised Tissue integrity  Goal: Damaged tissue is healing and protected  Outcome: Progressing  Flowsheets (Taken 07/03/2019 1702)  Damaged tissue is healing and protected:   Monitor/assess Braden scale every shift   Reposition patient every 2 hours and as needed unless able to reposition self   Provide wound care per wound care algorithm   Relieve pressure to bony prominences for patients at moderate and high risk   Keep intact skin clean and dry   Use incontinence wipes for cleaning urine, stool and caustic drainage. Foley care as needed   Monitor external devices/tubes for correct placement to prevent pressure, friction and shearing   Consult/collaborate with wound care nurse     Problem: Renal Instability  Goal: Fluid and electrolyte balance are achieved/maintained  Outcome: Progressing  Flowsheets (Taken 07/03/2019 1702)  Fluid and electrolyte balance are achieved/maintained:   Monitor intake and output every shift   Monitor/assess lab values and report abnormal values   Provide adequate hydration   Monitor daily weight   Assess for confusion/personality changes   Assess and reassess fluid and electrolyte status   Observe for cardiac arrhythmias   Monitor for muscle weakness     Problem: Neurological Deficit  Goal: Neurological status is stable or improving  Outcome: Progressing  Flowsheets (Taken 07/03/2019 1702)  Neurological status is stable or improving:   Monitor/assess/document neurological assessment (Stroke: every 4 hours)   Monitor/assess NIH  Stroke Scale   Re-assess NIH Stroke Scale for any change in status   Observe for seizure activity and initiate seizure precautions if indicated   Perform CAM Assessment     Problem: Every Day - Stroke  Goal: Neurological status is stable or improving  Outcome: Progressing  Flowsheets (Taken 07/03/2019 1702)  Neurological status is stable or improving:   Monitor/assess/document neurological assessment (Stroke: every 4 hours)   Monitor/assess NIH Stroke Scale   Re-assess NIH Stroke Scale for any change in status   Observe for seizure activity and initiate seizure precautions if indicated   Perform CAM Assessment

## 2019-07-03 NOTE — Progress Notes (Signed)
Arrived via bed from [facility]. S/p insertion of new right permcath. Ok to use per [employer]. Dressing dry and intact. Lungs clear. Pt. Slightly drowsy, but oriented x3. Telemetry SR. No c/o pain. Bilateral lower extremity edema 1+. Bilateral ports aspirated well. Report received from [place]     07/03/19 1403   Dialysis Weight   Pre-Treatment Weight (Kg) 110   Scale Type ICU Bed Scale   Vitals   Temp 98 F (36.7 C)   Heart Rate 70   Resp Rate 20   BP 138/66   Assessment   Mental Status Alert;Oriented;Cooperative   Cardiac (WDL) WDL   Cardiac Regularity Regular   Cardiac Symptoms None   Cardiac Rhythm Normal Sinus Rhythm   Respiratory  WDL   Respiratory Pattern Regular;Easy   Bilateral Breath Sounds Clear   Edema  X   RLE Edema +1   LLE Edema +1   General Skin Color Appropriate for ethnicity   Skin Condition/Temp Warm   Gastrointestinal (WDL) WDL   Abdomen Inspection Soft;Rounded   GI Symptoms None   Mobility Bed   [REMOVED] Permacath Catheter - Tunneled Subclavian vein catheter Right   Removal Date/Time: 07/03/19 1334  No Placement Date or Time found.   Present on Admission?: Yes  Access Type: Subclavian vein catheter  Orientation: Right  Removal Reason : Per order   Line necessity reviewed? Apheresis/hemodialysis   Catheter Lumen Volume Venous 1.7 mL   Catheter Lumen Volume Arterial 1.7 mL   Dressing Status and Intervention Dressing Intact;Clean & Dry   Tego/Curos Caps on Catheter Yes   NEW Tego/Curos Caps placed (Date) 07/03/19   Pain Assessment   Charting Type Assessment   Pain Scale Used Numeric Scale (0-10)   Numeric Pain Scale   Pain Score 0   POSS Score 2   Hemodialysis Comments   Pre-Hemodialysis Comments   (time out done)

## 2019-07-03 NOTE — Plan of Care (Signed)
Problem: Renal Instability  Goal: Fluid and electrolyte balance are achieved/maintained  Flowsheets (Taken 07/01/2019 1336 by Mickel Baas, RN)  Fluid and electrolyte balance are achieved/maintained:   Assess for confusion/personality changes   Monitor/assess lab values and report abnormal values   Assess and reassess fluid and electrolyte status   Monitor daily weight   Observe for cardiac arrhythmias   Monitor for muscle weakness  Goal: Free from infection  Flowsheets (Taken 07/01/2019 1336 by Mickel Baas, RN)  Free from infection: Monitor/assess for signs and symptoms of infection     Problem: Patient Receiving Advanced Renal Therapies  Goal: Therapy access site remains intact  Flowsheets (Taken 07/01/2019 1336 by Mickel Baas, RN)  Therapy access site remains intact:   Assess therapy access site   Change therapy access site dressing as needed

## 2019-07-03 NOTE — Progress Notes (Signed)
UF of 2.5  Liters. Right permcarth worked well. Dressing dry and intact. Lungs clear. No sob noted. Pt. Remained drowsy during treatment. Slight lower extremity edema noted. Telemetry SR. No c/o pain. Report called to Nena Jordan RN     07/03/19 1742   Treatment Summary   Time Off Machine 1745   Duration of Treatment (Hours) 3.5   Treatment Type 2:1   Dialyzer Clearance Moderately streaked   Fluid Volume Off (mL) 3000   Prime Volume (mL) 200   Rinseback Volume (mL) 200   Fluid Given: Normal Saline (mL) 0   Fluid Given: PRBC  0 mL   Fluid Given: Albumin (mL) 0   Fluid Given: Other (mL) 0   Total Fluid Given 400   Hemodialysis Net Fluid Removed 2600   Post Treatment Assessment   Post-Treatment Weight (Kg) 107.4   Patient Response to Treatment   (TOLERATED WELL)   [REMOVED] Permacath Catheter - Tunneled Subclavian vein catheter Right   Removal Date/Time: 07/03/19 1334  No Placement Date or Time found.   Present on Admission?: Yes  Access Type: Subclavian vein catheter  Orientation: Right  Removal Reason : Per order   Line necessity reviewed? Apheresis/hemodialysis   Catheter Lumen Volume Venous 1.7 mL   Catheter Lumen Volume Arterial 1.7 mL   Dressing Status and Intervention Dressing Intact;Clean & Dry   Tego/Curos Caps on Catheter Yes   NEW Tego/Curos Caps placed (Date) 07/03/19   Vitals   Temp 98 F (36.7 C)   Heart Rate 73   Resp Rate 16   BP 136/66   Assessment   Mental Status Alert;Oriented   Cardiac (WDL) WDL   Cardiac Regularity Regular   Cardiac Symptoms None   Cardiac Rhythm Normal Sinus Rhythm   Respiratory  WDL   Respiratory Pattern Regular;Easy   Bilateral Breath Sounds Clear   Edema  X   RLE Edema +1   LLE Edema +1   General Skin Color Appropriate for ethnicity   Skin Condition/Temp Warm   Gastrointestinal (WDL) WDL   Abdomen Inspection Soft;Rounded   GI Symptoms None   Mobility Ambulatory with Assistance   Pain Assessment   Charting Type Reassessment   Pain Scale Used Numeric Scale (0-10)   Numeric  Pain Scale   Pain Score 0   POSS Score 2   Education   Person taught Patient   Knowledge basis Substantial   Topics taught Procedure   Teaching Tools Explain   Reponse Verbalizes Understanding   Bedside Nurse Communication   Name of bedside RN - post dialysis   (shaya ahlberg rn)

## 2019-07-03 NOTE — Progress Notes (Signed)
[facility]  PROGRESS NOTE      Patient: [name]  Date: 07/03/2019   LOS: 1 Days  Admission Date: 06/30/2019   MRN: [id number]  Attending: Forest Becker  Please contact me on the following Spectralink [phone]       ASSESSMENT/PLAN     [name] is a 64 y.o. female admitted with Dizziness    Interval Summary:     Active Hospital Problems    Diagnosis    Dizziness    Fall    Open wound, abdominal wall, lateral, subsequent encounter    Uncontrolled type 2 diabetes mellitus with hyperglycemia    Hypertension    Hyperlipidemia    History of COPD    History of anemia due to chronic kidney disease    ESRD (end stage renal disease) on dialysis       Mechanical fall  ESRD on HD  Malfunction of tunneled dialysis catheter s/p exchange  Transient alteration in mental status  Possible wound infection      Monitor overnight  Discharge tomorrow if no issues  Follow-up wound cultures  If T>100.4 for more than an hour or T>101 start broad-spectrum antibiotics overnight  Follow-up blood cultures        Analgesia: Acetaminophen, Dilaudid    Nutrition: Renal    DVT Prophylaxis: Heparin       Code Status: DNR    DISPO: Inpatient, discharge once tunneled catheter exchanged    Family Contact: Daughter       SUBJECTIVE     [name] states in pain, back of head, arm.  Wanting pain meds.    6/10-  Drowsy, no acute issues    MEDICATIONS     Current Facility-Administered Medications   Medication Dose Route Frequency    acetaminophen  1,000 mg Oral TID    amLODIPine  5 mg Oral Q12H    atorvastatin  40 mg Oral Daily    carvedilol  25 mg Oral Q12H    collagenase   Topical Q24H    gabapentin  300 mg Oral Daily    [START ON 07/04/2019] heparin (porcine)  5,000 Units Subcutaneous Q8H [place]    insulin glargine  35 Units Subcutaneous QHS    insulin lispro  5 Units Subcutaneous TID MEALS    pantoprazole  40 mg Oral BID    sevelamer  800 mg Oral TID MEALS    sodium hypochlorite   Irrigation Daily     vitamin C  500 mg Oral Daily    zinc sulfate  220 mg Oral Daily       PHYSICAL EXAM     Vitals:    07/03/19 1742   BP: 136/66   Pulse: 73   Resp: 16   Temp: 98 F (36.7 C)   SpO2:        Temperature: Temp  Min: 97.7 F (36.5 C)  Max: 99.3 F (37.4 C)  Pulse: Pulse  Min: 65  Max: 89  Respiratory: Resp  Min: 15  Max: 23  Non-Invasive BP: BP  Min: 104/66  Max: 180/79  Pulse Oximetry SpO2  Min: 91 %  Max: 100 %    Intake and Output Summary (Last 24 hours) at Date Time    Intake/Output Summary (Last 24 hours) at 07/03/2019 1815  Last data filed at 07/03/2019 1742  Gross per 24 hour   Intake --   Output 2700 ml   Net -2700 ml  GEN APPEARANCE: Normal;  A&OX3  HEENT: PERLA; EOMI; Conjunctiva Clear  NECK: Supple; No bruits  CVS: RRR, S1, S2; No M/G/R  LUNGS: CTAB; No Wheezes; No Rhonchi: No rales  ABD: Soft; No TTP; + Normoactive BS  EXT: No edema; Pulses 2+ and intact  Skin exam:  pink  NEURO: CN 2-12 intact; No Focal neurological deficits  CAP REFILL:  Normal  MENTAL STATUS:  Normal    Exam done by Forest Becker, MD on 07/02/19 at 5:33 PM      LABS     Recent Labs   Lab 07/03/19  0604 07/02/19  0357 07/01/19  2241   WBC 13.27* 10.01* 10.60*   RBC 2.93* 2.79* 2.86*   Hgb 8.3* 7.8* 8.1*   Hematocrit 27.4* 26.5* 26.9*   MCV 93.5 95.0 94.1   Platelets 118* 115* 126*       Recent Labs   Lab 07/03/19  0604 07/02/19  0357 07/01/19  2241 07/01/19  0347 06/30/19  2256   Sodium 135* 135* 134* 133* 132*   Potassium 6.0* 5.1 5.3* 5.3* 5.3*   Chloride 103 101 100 100 97*   CO2 19* 22 22 20* 20*   BUN 70.0* 61.0* 56.0* 73.0* 71.0*   Creatinine 5.9* 5.4* 5.2* 6.6* 6.7*   Glucose 294* 229* 322* 226* 271*   Calcium 8.1* 8.0* 7.9* 8.2* 8.9   Magnesium 1.8 1.9  --   --   --        Recent Labs   Lab 07/03/19  0604 07/02/19  0357 06/30/19  2256   ALT  --   --  10   AST (SGOT)  --   --  10   Bilirubin, Total  --   --  0.6   Albumin 2.3* 2.3* 3.0*   Alkaline Phosphatase  --   --  134*       Recent Labs   Lab 07/01/19  2241   Creatine  Kinase (CK) 20*       Recent Labs   Lab 07/01/19  2241   PT INR 1.1   PT 12.7       Microbiology Results     Procedure Component Value Units Date/Time    COVID-19 (SARS-COV-2) [facility] [employer]) [[id number]] Collected: 06/30/19 2256    Specimen: Nasopharyngeal Swab from Nasopharynx Updated: 06/30/19 2341     Purpose of COVID testing Screening     SARS-CoV-2 Specimen Source Nasopharyngeal     SARS CoV 2 Overall Result Negative     Comment: Test performed using the Abbott ID NOW EUA assay.  Please see Fact Sheets for patients and providers located at:  http://olson-hall.info/  This test is for the qualitative detection of SARS-CoV-2  (COVID19) nucleic acid. Viral nucleic acids may persist in vivo,  independent of viability. Detection of viral nucleic acid does  not imply the presence of infectious virus, or that virus  nucleic acid is the cause of clinical symptoms. Negative  results should be treated as presumptive and, if inconsistent  with clinical signs and symptoms or necessary for patient  management, should be tested with an alternative molecular  assay. Negative results do not preclude SARS-CoV-2 infection  and should not be used as the sole basis for patient  management decisions. Invalid results may be due to inhibiting  substances in the specimen and recollection should occur.         Narrative:      Screening    CULTURE BLOOD AEROBIC AND  ANAEROBIC [[id number]] Collected: 07/01/19 2340    Specimen: Blood, Venipuncture Updated: 07/02/19 0454    Narrative:      1 BLUE+1 PURPLE    CULTURE BLOOD AEROBIC AND ANAEROBIC [[id number]] Collected: 07/01/19 2340    Specimen: Blood, Venipuncture Updated: 07/02/19 0454    Narrative:      1 BLUE+1 PURPLE    Culture + Gram Stain,Aerobic, Wound [[id number]] Collected: 07/02/19 0009    Specimen: Wound from Discharge Updated: 07/02/19 0602    Narrative:      ORDER#: [id number]                                    ORDERED BY: Sheppard Coil  SOURCE: Discharge abdominal wound                     COLLECTED:  07/02/19 00:09  ANTIBIOTICS AT COLL.:                                RECEIVED :  07/02/19 04:57  Stain, Gram                                FINAL       07/02/19 06:02  07/02/19   Moderate WBCs             Moderate Gram positive cocci             Moderate Gram negative rods             No Squamous epithelial cells seen  Culture and Gram Stain, Aerobic, Wound     PENDING             Wrist Right PA Lateral And Oblique    Result Date: 06/30/2019   4 views of the right wrist were compared to a similar study from 06/25/2019. No new fractures seen. No malalignment. Soft tissue swelling present. Atherosclerosis. Lorretta Harp, DO  06/30/2019 11:28 PM    CT Head WO Contrast    Result Date: 07/01/2019  Stable left posterior parietal soft tissue swelling with no acute intracranial process. Delaney Meigs, MD  07/01/2019 11:48 PM    CT Head without Contrast    Result Date: 06/30/2019   No acute intracranial findings demonstrated. No acute skull fracture. Posterior scalp hematoma. Lorretta Harp, DO  06/30/2019 11:43 PM    CT Cervical Spine without Contrast    Result Date: 06/30/2019  No evidence of acute cervical spine fracture or malalignment. Gillermina Phy, MD  06/30/2019 11:56 PM    XR Chest AP Portable    Result Date: 07/02/2019  No acute process. Delaney Meigs, MD  07/02/2019 12:36 AM    Tunneled Cath Check/Change (Permcath)    Result Date: 07/03/2019   Successful right internal jugular tunneled dialysis catheter exchange Nelma Rothman, MD  07/03/2019 2:21 PM        Echo Results     None          RADIOLOGY     Upon my review:    Signed,  Forest Becker  6:15 PM 07/03/2019

## 2019-07-03 NOTE — Plan of Care (Signed)
C/o abdominal pain from old infected wound relieved by Oxycodone x1; refusing scheduled tylenol @ bedtime    BG= 276 @bedtime ; Lantus given half the dose    NPO post midnight for HD cath removal in AM    Neuro checks; calm and appropriate     Problem: Compromised Tissue integrity  Goal: Damaged tissue is healing and protected  07/03/2019 0134 by Feliberto Gottron Rycca R, RN  Outcome: Progressing  Flowsheets (Taken 07/03/2019 0134)  Damaged tissue is healing and protected:   Keep intact skin clean and dry   Use bath wipes, not soap and water, for daily bathing   Avoid shearing injuries     Problem: Renal Instability  Goal: Free from infection  Outcome: Progressing  Flowsheets (Taken 07/01/2019 1336 by Mickel Baas, RN)  Free from infection: Monitor/assess for signs and symptoms of infection     Problem: Patient Receiving Advanced Renal Therapies  Goal: Therapy access site remains intact  Outcome: Progressing  Flowsheets (Taken 07/01/2019 1336 by Mickel Baas, RN)  Therapy access site remains intact:   Assess therapy access site   Change therapy access site dressing as needed     Problem: Pain interferes with ability to perform ADL  Goal: Pain at adequate level as identified by patient  Outcome: Progressing  Flowsheets (Taken 07/03/2019 0134)  Pain at adequate level as identified by patient: Identify patient comfort function goal     Problem: Neurological Deficit  Goal: Neurological status is stable or improving  Outcome: Progressing     Problem: Fluid and Electrolyte Imbalance/ Endocrine  Goal: Fluid and electrolyte balance are achieved/maintained  Outcome: Progressing  Flowsheets (Taken 07/03/2019 0134)  Fluid and electrolyte balance are achieved/maintained:   Assess and reassess fluid and electrolyte status   Monitor/assess lab values and report abnormal values     Problem: Diabetes: Glucose Imbalance  Goal: Blood glucose stable at established goal  Outcome: Progressing  Flowsheets (Taken 07/03/2019 0134)  Blood glucose  stable at established goal:   Assess for hypoglycemia /hyperglycemia   Follow fluid restrictions/IV/PO parameters

## 2019-07-04 ENCOUNTER — Encounter: Payer: Self-pay | Admitting: Diagnostic Radiology

## 2019-07-04 DIAGNOSIS — Z8673 Personal history of transient ischemic attack (TIA), and cerebral infarction without residual deficits: Secondary | ICD-10-CM

## 2019-07-04 LAB — GLUCOSE WHOLE BLOOD - POCT
Whole Blood Glucose POCT: 222 mg/dL — ABNORMAL HIGH (ref 70–100)
Whole Blood Glucose POCT: 272 mg/dL — ABNORMAL HIGH (ref 70–100)
Whole Blood Glucose POCT: 215 mg/dL — ABNORMAL HIGH (ref 70–100)
Whole Blood Glucose POCT: 144 mg/dL — ABNORMAL HIGH (ref 70–100)

## 2019-07-04 LAB — CBC
Absolute NRBC: 0 10*3/uL (ref 0.00–0.00)
Hematocrit: 26.6 % — ABNORMAL LOW (ref 34.7–43.7)
Hgb: 7.9 g/dL — ABNORMAL LOW (ref 11.4–14.8)
MCH: 27.6 pg (ref 25.1–33.5)
MCHC: 29.7 g/dL — ABNORMAL LOW (ref 31.5–35.8)
MCV: 93 fL (ref 78.0–96.0)
MPV: 12.8 fL — ABNORMAL HIGH (ref 8.9–12.5)
Nucleated RBC: 0 /100 WBC (ref 0.0–0.0)
Platelets: 118 10*3/uL — ABNORMAL LOW (ref 142–346)
RBC: 2.86 10*6/uL — ABNORMAL LOW (ref 3.90–5.10)
RDW: 13 % (ref 11–15)
WBC: 8.85 10*3/uL (ref 3.10–9.50)

## 2019-07-04 LAB — RENAL FUNCTION PANEL
Albumin: 2.2 g/dL — ABNORMAL LOW (ref 3.5–5.0)
Anion Gap: 10 (ref 5.0–15.0)
BUN: 37 mg/dL — ABNORMAL HIGH (ref 7.0–19.0)
CO2: 27 mEq/L (ref 22–29)
Calcium: 8.1 mg/dL — ABNORMAL LOW (ref 8.5–10.5)
Chloride: 100 mEq/L (ref 100–111)
Creatinine: 4.1 mg/dL — ABNORMAL HIGH (ref 0.6–1.0)
Glucose: 169 mg/dL — ABNORMAL HIGH (ref 70–100)
Phosphorus: 4.9 mg/dL — ABNORMAL HIGH (ref 2.3–4.7)
Potassium: 4.1 mEq/L (ref 3.5–5.1)
Sodium: 137 mEq/L (ref 136–145)

## 2019-07-04 LAB — HEMOLYSIS INDEX: Hemolysis Index: 2 (ref 0–18)

## 2019-07-04 LAB — MAGNESIUM: Magnesium: 1.9 mg/dL (ref 1.6–2.6)

## 2019-07-04 LAB — GFR: EGFR: 13.2

## 2019-07-04 MED ORDER — VANCOMYCIN HCL IN NACL 1.5-0.9 GM/500ML-% IV SOLN
1500.00 mg | Freq: Once | INTRAVENOUS | Status: AC
Start: 2019-07-04 — End: 2019-07-04
  Administered 2019-07-04: 1500 mg via INTRAVENOUS
  Filled 2019-07-04: qty 500

## 2019-07-04 MED ORDER — LEVOFLOXACIN 250 MG PO TABS
500.00 mg | ORAL_TABLET | ORAL | Status: AC
Start: 2019-07-06 — End: 2019-07-10
  Administered 2019-07-06 – 2019-07-10 (×4): 500 mg via ORAL
  Filled 2019-07-04 (×4): qty 2

## 2019-07-04 MED ORDER — LEVOFLOXACIN 250 MG PO TABS
750.00 mg | ORAL_TABLET | Freq: Once | ORAL | Status: AC
Start: 2019-07-04 — End: 2019-07-04
  Administered 2019-07-04: 13:00:00 750 mg via ORAL
  Filled 2019-07-04: qty 3

## 2019-07-04 MED ORDER — LEVOFLOXACIN IN D5W 500 MG/100ML IV SOLN
500.00 mg | INTRAVENOUS | Status: DC
Start: 2019-07-06 — End: 2019-07-04

## 2019-07-04 MED ORDER — LEVOFLOXACIN IN D5W 750 MG/150ML IV SOLN
750.00 mg | Freq: Once | INTRAVENOUS | Status: DC
Start: 2019-07-04 — End: 2019-07-04

## 2019-07-04 MED ORDER — VANCOMYCIN PHARMACY TO DOSE PLACEHOLDER
INTRAVENOUS | Status: DC
Start: 2019-07-04 — End: 2019-07-15

## 2019-07-04 NOTE — Progress Notes (Signed)
[facility] PROGRESS NOTE  Aaron Edelman, x [phone] [facility] Spectralink)      Date Time: 07/04/19 10:35 AM  Patient Name: [name]  Attending Physician: Forest Becker, MD    CC: follow-up CKD    Assessment:     1. CKD- on HD at [facility]  2. Malfunctioning dialysis catheter  3. Volume overload- mild  4. HTN  5. Anemia- of CKD  6. Secondary HPT  7. 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 Recurrent falls      Recommendations:     S/p new permcath placement yesterday - tolerated HD well with 2.5L net ultrafiltration  Next HD tomorrow with ultrafiltration   Abx per ID    Case discussed with: pt, HD RN   Verl Blalock, MD  [facility]  [phone] (office)  X [phone] ([place])      Subjective: due to infected abdominal wall wound, plan to be changed to a private room. Reports being malodorous     Review of Systems:   Review of Systems - No CP, SOB, dizziness    Physical Exam:     Vitals:    07/03/19 1923 07/03/19 2314 07/04/19 0415 07/04/19 0800   BP: 141/76 104/66 119/75 102/65   Pulse: 75 64 60 61   Resp: 16 17 18 16    Temp: 98.6 F (37 C) 98.1 F (36.7 C) 97.9 F (36.6 C) 97.5 F (36.4 C)   TempSrc: Oral Oral Oral Oral   SpO2: 94% 97% 97% 98%   Weight:    104.3 kg (230 lb)   Height:           Intake and Output Summary (Last 24 hours) at Date Time    Intake/Output Summary (Last 24 hours) at 07/04/2019 1035  Last data filed at 07/03/2019 1742  Gross per 24 hour   Intake --   Output 2600 ml   Net -2600 ml       General: awake, alert, oriented x 3, no acute distress.  Cardiovascular: regular rate and rhythm  Lungs: clear to auscultation bilaterally, bilateral air entry, normal work of breathing  Abdomen: soft; LLQ wound bandaged  Extremities: trace edema  Other: RIJ Permcath    Meds:      Scheduled Meds: PRN Meds:    acetaminophen, 1,000 mg, Oral, TID  amLODIPine, 5 mg, Oral, Q12H  atorvastatin, 40 mg, Oral, Daily  carvedilol, 25 mg, Oral,  Q12H  collagenase, , Topical, Q24H  gabapentin, 300 mg, Oral, Daily  heparin (porcine), 5,000 Units, Subcutaneous, Q8H SCH  insulin glargine, 35 Units, Subcutaneous, QHS  insulin lispro, 5 Units, Subcutaneous, TID MEALS  levoFLOXacin, 750 mg, Oral, Once   Followed by  [START ON 07/06/2019] levoFLOXacin, 500 mg, Oral, Q48H  pantoprazole, 40 mg, Oral, BID  sevelamer, 800 mg, Oral, TID MEALS  sodium hypochlorite, , Irrigation, Daily  vancomycin, 1,500 mg, Intravenous, Once  vancomycin, , Intravenous, See Admin Instructions  vitamin C, 500 mg, Oral, Daily  zinc sulfate, 220 mg, Oral, Daily          Continuous Infusions:   dextrose, 15 g of glucose, PRN   And  dextrose, 12.5 g, PRN   And  glucagon (rDNA), 1 mg, PRN  HYDROmorphone, 0.4 mg, Q3H PRN  HYDROmorphone, 1 mg, Q3H PRN  melatonin, 3 mg, QHS PRN  naloxone, 0.2 mg, PRN  ondansetron, 4 mg, Q6H PRN   Or  ondansetron, 4 mg, Q6H PRN  oxyCODONE, 5 mg,  Q6H PRN              Labs:     Recent Labs   Lab 07/04/19  0419 07/03/19  0604 07/02/19  0357   WBC 8.85 13.27* 10.01*   Hgb 7.9* 8.3* 7.8*   Hematocrit 26.6* 27.4* 26.5*   Platelets 118* 118* 115*     Recent Labs   Lab 07/04/19  0419 07/03/19  0604 07/02/19  0357   Sodium 137 135* 135*   Potassium 4.1 6.0* 5.1   Chloride 100 103 101   CO2 27 19* 22   BUN 37.0* 70.0* 61.0*   Creatinine 4.1* 5.9* 5.4*   Calcium 8.1* 8.1* 8.0*   Albumin 2.2* 2.3* 2.3*   Phosphorus 4.9* 4.7 4.3   Magnesium 1.9 1.8 1.9   Glucose 169* 294* 229*   EGFR 13.2 8.7 9.6       Recent Labs   Lab 07/01/19  2241   Urine Type Urine, Clean Ca   Color, UA Yellow   Clarity, UA Hazy   Specific Gravity UA 1.015   Urine pH 6.0   Nitrite, UA Negative   Ketones UA Negative   Urobilinogen, UA Negative   Bilirubin, UA Negative   Blood, UA Negative   RBC, UA 3 - 5   WBC, UA 0 - 5                 Signed by: Verl Blalock, MD

## 2019-07-04 NOTE — Progress Notes (Signed)
Wound dressing changed at this time. Purulent drainage with some bleeding  noted from wound. Pain med given as prescribed.

## 2019-07-04 NOTE — Plan of Care (Signed)
Problem: Safety  Goal: Patient will be free from injury during hospitalization  Outcome: Progressing  Flowsheets (Taken 07/01/2019 1208 by Janalyn Harder, RN)  Patient will be free from injury during hospitalization:   Assess patient's risk for falls and implement fall prevention plan of care per policy   Use appropriate transfer methods   Include patient/ family/ care giver in decisions related to safety   Ensure appropriate safety devices are available at the bedside   Provide and maintain safe environment   [place] rounding   Assess for patients risk for elopement and implement Elopement Risk Plan per policy     Problem: Pain  Goal: Pain at adequate level as identified by patient  Outcome: Progressing  Flowsheets (Taken 07/04/2019 2153)  Pain at adequate level as identified by patient:   Identify patient comfort function goal   Reassess pain within 30-60 minutes of any procedure/intervention, per Pain Assessment, Intervention, Reassessment (AIR) Cycle   Assess pain on admission, during daily assessment and/or before any "as needed" intervention(s)

## 2019-07-04 NOTE — Plan of Care (Signed)
Discharge Recommendation: SNF (pt may progress to home with HHOT/HHPT)   DME Recommended for Discharge: [facility], Shower chair (pt has rollator)    OT Frequency Recommended: 2-3x/wk     Is PT evaluation indicated at this time? This patient is already on PT caseload.     PMP Activity: Step 5 - Chair  Distance Walked (ft) (Step 6,7): 0 Feet  (Please See Therapy Evaluation for device and assistance level needed)    Treatment Interventions: ADL retraining, Functional transfer training, Endurance training, Patient/Family training, Equipment eval/education, Continued evaluation       Goal Formulation: Patient  Time For Goal Achievement: by time of discharge  ADL Goals  Patient will groom self: Supervision, at sinkside  Patient will dress upper body: Supervision, Not met  Patient will dress lower body: Contact Guard Assist, with AE  Patient will toilet: Moderate Assist, Not met  Mobility and Transfer Goals  Pt will perform functional transfers: Supervision  Other Goal: Pt will tolerate OOB ADL and functional mobility trial within 2 visits in prep for safe [place] planning (goal met)  Other Goal #2: Pt will amb to and from bathroom w FWW/Supv

## 2019-07-04 NOTE — Progress Notes (Signed)
Pt admitted to the unit from unit 25 . No signs of distress noted. Pt oriented to the unit , white board, room phone, and nursing staff. Fall precautions in place. Safety precautions in place. Pt educated on fall precautions, verbalized understanding. Belongings at pt's reach. Will continue to monitor.

## 2019-07-04 NOTE — Progress Notes (Signed)
[facility]  PROGRESS NOTE      Patient: [name]  Date: 07/04/2019   LOS: 2 Days  Admission Date: 06/30/2019   MRN: [id number]  Attending: Forest Young  Please contact me on the following Spectralink [phone]       ASSESSMENT/PLAN     [name] is a 64 y.o. female admitted with Dizziness    Interval Summary:     Initially presented with mechanical fall.  Then had issue with her tunneled dialysis catheter and had that exchanged on 6/10.  On night of 6/8 patient had febrile episode with confusion.  Had wound culture sent for him her chronic abdominal wound.  Grew MRSA and Pseudomonas.  Consulted ID and started on IV vancomycin along with oral Levaquin.  Plastic surgery was consulted and recommended continued wound care with [facility] solutions.  Plastic surgery to follow peripherally.  Continued IV antibiotics.    Active Hospital Problems    Diagnosis    Dizziness    Fall    Open wound, abdominal wall, lateral, subsequent encounter    Uncontrolled type 2 diabetes mellitus with hyperglycemia    Hypertension    Hyperlipidemia    History of COPD    History of anemia due to chronic kidney disease    ESRD (end stage renal disease) on dialysis       Abdominal wound infection due to MRSA and Pseudomonas  Mechanical fall  ESRD on HD  Malfunction of tunneled dialysis catheter s/p exchange  Transient alteration in mental status  Possible wound infection  Essential hypertension  Hyperlipidemia  Neuropathy, NOS  DM2        ID consulted: Appreciate their recommendations  Start IV vancomycin  Start p.o. Levaquin  Plastic surgery consultation: Appreciate recommendations  PT/OT: SNF  Continue pain control  -Acetaminophen 1000 mg 3 times daily  -Oxycodone 5 mg every 6 hours as needed moderate pain  -Dilaudid 0.4 mg every 3 hours as needed moderate pain (second line)  -Dilaudid 1 mg every 3 hours as needed for severe pain  Start bowel regimen with Colace and MiraLAX  Continue Norvasc  Continue  Lipitor  Continue carvedilol  SSI and adjust accordingly with Lantus  Continue Protonix        Analgesia: Acetaminophen, Dilaudid    Nutrition: Renal    DVT Prophylaxis: Heparin       Code Status: DNR    DISPO: Inpatient, discharge once tunneled catheter exchanged    Family Contact: Daughter       Bostic states in pain, back of head, arm.  Wanting pain meds.    6/10-  Drowsy, no acute issues    6/11-concerned about her abdominal wound.  Discussed culture results.  Pain minimal.  No other issues.    MEDICATIONS     Current Facility-Administered Medications   Medication Dose Route Frequency    acetaminophen  1,000 mg Oral TID    amLODIPine  5 mg Oral Q12H    atorvastatin  40 mg Oral Daily    carvedilol  25 mg Oral Q12H    collagenase   Topical Q24H    gabapentin  300 mg Oral Daily    heparin (porcine)  5,000 Units Subcutaneous Q8H [place]    insulin glargine  35 Units Subcutaneous QHS    insulin lispro  5 Units Subcutaneous TID MEALS    [START ON 07/06/2019] levoFLOXacin  500 mg Oral Q48H    pantoprazole  40 mg Oral BID    sevelamer  800 mg Oral TID MEALS    sodium hypochlorite   Irrigation Daily    vancomycin   Intravenous See Admin Instructions    vitamin C  500 mg Oral Daily    zinc sulfate  220 mg Oral Daily       PHYSICAL EXAM     Vitals:    07/04/19 1256   BP: 114/60   Pulse: 65   Resp: 16   Temp: 98.2 F (36.8 C)   SpO2: 97%       Temperature: Temp  Min: 97.5 F (36.4 C)  Max: 98.6 F (37 C)  Pulse: Pulse  Min: 60  Max: 75  Respiratory: Resp  Min: 16  Max: 18  Non-Invasive BP: BP  Min: 102/65  Max: 141/76  Pulse Oximetry SpO2  Min: 94 %  Max: 99 %    Intake and Output Summary (Last 24 hours) at Date Time    Intake/Output Summary (Last 24 hours) at 07/04/2019 1624  Last data filed at 07/04/2019 1100  Gross per 24 hour   Intake 120 ml   Output 2600 ml   Net -2480 ml         GEN APPEARANCE: Normal;  A&OX3  HEENT: PERLA; EOMI; Conjunctiva Clear  NECK: Supple; No bruits  CVS:  RRR, S1, S2; No M/G/R  LUNGS: CTAB; No Wheezes; No Rhonchi: No rales  ABD: Left upper quadrant abdominal wound with no discharge; packing in place  EXT: No edema; Pulses 2+ and intact  Skin exam:  pink  NEURO: CN 2-12 intact; No Focal neurological deficits  CAP REFILL:  Normal  MENTAL STATUS:  Normal    Exam done by Diane Becker, MD on 07/02/19 at 5:33 PM      LABS     Recent Labs   Lab 07/04/19  0419 07/03/19  0604 07/02/19  0357   WBC 8.85 13.27* 10.01*   RBC 2.86* 2.93* 2.79*   Hgb 7.9* 8.3* 7.8*   Hematocrit 26.6* 27.4* 26.5*   MCV 93.0 93.5 95.0   Platelets 118* 118* 115*       Recent Labs   Lab 07/04/19  0419 07/03/19  0604 07/02/19  0357 07/01/19  2241 07/01/19  0347   Sodium 137 135* 135* 134* 133*   Potassium 4.1 6.0* 5.1 5.3* 5.3*   Chloride 100 103 101 100 100   CO2 27 19* 22 22 20*   BUN 37.0* 70.0* 61.0* 56.0* 73.0*   Creatinine 4.1* 5.9* 5.4* 5.2* 6.6*   Glucose 169* 294* 229* 322* 226*   Calcium 8.1* 8.1* 8.0* 7.9* 8.2*   Magnesium 1.9 1.8 1.9  --   --        Recent Labs   Lab 07/04/19  0419 07/03/19  0604 07/02/19  0357 06/30/19  2256 06/30/19  2256   ALT  --   --   --   --  10   AST (SGOT)  --   --   --   --  10   Bilirubin, Total  --   --   --   --  0.6   Albumin 2.2* 2.3* 2.3*  More results in Results Review 3.0*   Alkaline Phosphatase  --   --   --   --  134*   More results in Results Review = values in this interval not displayed.       Recent Labs   Lab 07/01/19  2241   Creatine Kinase (CK) 20*       Recent Labs   Lab 07/01/19  2241   PT INR 1.1   PT 12.7       Microbiology Results     Procedure Component Value Units Date/Time    COVID-19 (SARS-COV-2) [place] [employer] [facility]) [[id number]] Collected: 06/30/19 2256    Specimen: Nasopharyngeal Swab from Nasopharynx Updated: 06/30/19 2341     Purpose of COVID testing Screening     SARS-CoV-2 Specimen Source Nasopharyngeal     SARS CoV 2 Overall Result Negative     Comment: Test performed using the Abbott ID NOW EUA assay.  Please see Fact Sheets for  patients and providers located at:  http://olson-hall.info/  This test is for the qualitative detection of SARS-CoV-2  (COVID19) nucleic acid. Viral nucleic acids may persist in vivo,  independent of viability. Detection of viral nucleic acid does  not imply the presence of infectious virus, or that virus  nucleic acid is the cause of clinical symptoms. Negative  results should be treated as presumptive and, if inconsistent  with clinical signs and symptoms or necessary for patient  management, should be tested with an alternative molecular  assay. Negative results do not preclude SARS-CoV-2 infection  and should not be used as the sole basis for patient  management decisions. Invalid results may be due to inhibiting  substances in the specimen and recollection should occur.         Narrative:      Screening    CULTURE BLOOD AEROBIC AND ANAEROBIC [[id number]] Collected: 07/01/19 2340    Specimen: Blood, Venipuncture Updated: 07/02/19 0454    Narrative:      1 BLUE+1 PURPLE    CULTURE BLOOD AEROBIC AND ANAEROBIC [[id number]] Collected: 07/01/19 2340    Specimen: Blood, Venipuncture Updated: 07/02/19 0454    Narrative:      1 BLUE+1 PURPLE    Culture + Gram Stain,Aerobic, Wound [[id number]] Collected: 07/02/19 0009    Specimen: Wound from Discharge Updated: 07/02/19 0602    Narrative:      ORDER#: [id number]                                    ORDERED BY: Sheppard Coil  SOURCE: Discharge abdominal wound                    COLLECTED:  07/02/19 00:09  ANTIBIOTICS AT COLL.:                                RECEIVED :  07/02/19 04:57  Stain, Gram                                FINAL       07/02/19 06:02  07/02/19   Moderate WBCs             Moderate Gram positive cocci             Moderate Gram negative rods             No Squamous epithelial cells seen  Culture and Gram Stain, Aerobic, Wound     PENDING             Wrist Right PA Lateral And Oblique    Result Date: 06/30/2019  4 views of the right wrist were  compared to a similar study from 06/25/2019. No new fractures seen. No malalignment. Soft tissue swelling present. Atherosclerosis. Lorretta Harp, DO  06/30/2019 11:28 PM    CT Head WO Contrast    Result Date: 07/01/2019  Stable left posterior parietal soft tissue swelling with no acute intracranial process. Delaney Meigs, MD  07/01/2019 11:48 PM    CT Head without Contrast    Result Date: 06/30/2019   No acute intracranial findings demonstrated. No acute skull fracture. Posterior scalp hematoma. Lorretta Harp, DO  06/30/2019 11:43 PM    CT Cervical Spine without Contrast    Result Date: 06/30/2019  No evidence of acute cervical spine fracture or malalignment. Gillermina Phy, MD  06/30/2019 11:56 PM    XR Chest AP Portable    Result Date: 07/02/2019  No acute process. Delaney Meigs, MD  07/02/2019 12:36 AM    Tunneled Cath Check/Change (Permcath)    Result Date: 07/04/2019   Successful right internal jugular tunneled dialysis catheter exchange Nelma Rothman, MD  07/03/2019 2:21 PM        Echo Results     None          RADIOLOGY     Upon my review:    Signed,  [name]  4:24 PM 07/04/2019

## 2019-07-04 NOTE — Consults (Signed)
Plastic Surgery Consultation    Patient Name: [name], [name]  Date Time: 07/04/19 3:46 PM  Requesting Physician: Forest Becker, MD    Reason for Consultation:   Left abdominal PD catheter wound    History:   KIKUE GERHART is a 64 y.o. female who presents to the hospital on 06/30/2019.  Plastic surgery was consulted for Left abdominal PD catheter wound that occurred 3 weeks ago.  Patient was admitted after a fall.    I reviewed the chart, and confirmed the following:  Past medical history  1. End-stage renal disease  2. Hypertension  3. Diabetes  4. Hyperlipidemia  5. COPD  6. Shoulder and knee replacement.    SAMYRIA RUDIE is a 64 y.o. 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 Patient discharged home 06/29/19 after admission for RUE numbness/pain secondary to triquetral fracture with possible gout/pseudogout flare. At home patient with two mechanical falls, reports tripping over cane, dizziness, and L leg giving out. Endorses head trauma with fall as well as generalized body pain worse in R wrist and L leg. States she was not down long, received help from people in her building. Denies LOC, chest pain, SOB. Additionally patient was scheduled to receive dialysis today, but reports transport never arrived. When she followed up she was told there was a scheduling error.    Past Medical History:   History reviewed. No pertinent past medical history.    Past Surgical History:    has a past surgical history that includes [place] surgery (Left); Joint replacement; Joint replacement; EGD, BIOPSY (N/A, 05/14/2019); Tunneled Cath Placement (Permcath) (Bilateral, 05/23/2019); REMOVAL, FOREIGN BODY (N/A, 05/27/2019); EXPLORATORY LAPAROTOMY (N/A, 05/27/2019); and Tunneled Cath Check/Change (Permcath) (N/A, 07/03/2019).    Family History:    family history includes Diabetes in her brother, father, mother, paternal aunt, paternal uncle, and sister; Hypertension in her brother, father, mother, and sister.    Social History:    reports that she has never smoked. She has never used smokeless tobacco. She reports that she does not drink alcohol and does not use drugs.    Allergies:     Allergies   Allergen Reactions    Lisinopril Hives    Metformin Hives       Medications:     Current Facility-Administered Medications:     acetaminophen (TYLENOL) tablet 1,000 mg, 1,000 mg, Oral, TID, Forest Becker, MD, 1,000 mg at 07/04/19 1321    amLODIPine (NORVASC) tablet 5 mg, 5 mg, Oral, Q12H, Forest Becker, MD, 5 mg at 07/04/19 7858    atorvastatin (LIPITOR) tablet 40 mg, 40 mg, Oral, Daily, Forest Becker, MD, 40 mg at 07/04/19 0831    carvedilol (COREG) tablet 25 mg, 25 mg, Oral, Q12H, Forest Becker, MD, 25 mg at 07/04/19 8502    collagenase (SANTYL) ointment, , Topical, Q24H, Forest Becker, MD, Given at 07/04/19 [id number]    Nursing communication: Adult Hypoglycemia Treatment Algorithm, , , Until Discontinued **AND** dextrose (GLUCOSE) 40 % oral gel 15 g of glucose, 15 g of glucose, Oral, PRN **AND** dextrose 50 % bolus 12.5 g, 12.5 g, Intravenous, PRN **AND** glucagon (rDNA) (GLUCAGEN) injection 1 mg, 1 mg, Intramuscular, PRN, Forest Becker, MD    gabapentin (NEURONTIN) capsule 300 mg, 300 mg, Oral, Daily, Forest Becker, MD, 300 mg at  07/04/19 0831    heparin (porcine) injection 5,000 Units, 5,000 Units, Subcutaneous, Q8H [place], Kasap, Princess Bruins, NP, 5,000 Units at 07/04/19 1029    HYDROmorphone (DILAUDID) injection 0.4 mg, 0.4 mg, Intravenous, Q3H PRN, Forest Becker, MD    HYDROmorphone (DILAUDID) injection 1 mg, 1 mg, Intravenous, Q3H PRN, Forest Becker, MD    insulin glargine (LANTUS) injection 35 Units, 35 Units, Subcutaneous, QHS, 35 Units at 07/03/19 2048 **AND** NSG Communication: Glucose POCT order, , , Daily, Forest Becker, MD    insulin lispro (HumaLOG) injection 5 Units, 5 Units, Subcutaneous, TID MEALS, Forest Becker, MD, 5 Units at 07/04/19 1323    [COMPLETED] levoFLOXacin (LEVAQUIN) tablet 750 mg, 750 mg, Oral, Once, 750 mg at 07/04/19 1254 **FOLLOWED BY** [START ON 07/06/2019] levoFLOXacin (LEVAQUIN) tablet 500 mg, 500 mg, Oral, Q48H, Reines, Freddi Che, MD    melatonin tablet 3 mg, 3 mg, Oral, QHS PRN, Forest Becker, MD    naloxone [facility]) injection 0.2 mg, 0.2 mg, Intravenous, PRN, Forest Becker, MD    ondansetron (ZOFRAN-ODT) disintegrating tablet 4 mg, 4 mg, Oral, Q6H PRN **OR** ondansetron (ZOFRAN) injection 4 mg, 4 mg, Intravenous, Q6H PRN, Forest Becker, MD    oxyCODONE (ROXICODONE) immediate release tablet 5 mg, 5 mg, Oral, Q6H PRN, Forest Becker, MD, 5 mg at 07/02/19 2229    pantoprazole (PROTONIX) EC tablet 40 mg, 40 mg, Oral, BID, Forest Becker, MD, 40 mg at 07/04/19 1438    sevelamer (RENVELA) tablet 800 mg, 800 mg, Oral, TID MEALS, Forest Becker, MD, 800 mg at 07/04/19 1253    sodium hypochlorite (DAKIN'S QUARTER STRENGTH) 0.125 % 0.125% external solution, , Irrigation, Daily, Forest Becker, MD, Given at 07/04/19 8875    vancomycin Zollie Pee) therapy dosed by pharmacy, , Intravenous, See Admin Instructions, Forest Becker, MD    vitamin C (ASCORBIC ACID) tablet 500 mg, 500 mg, Oral, Daily, Forest Becker, MD, 500 mg at 07/04/19 7972    zinc sulfate (ZINCATE) capsule 220 mg, 220 mg, Oral, Daily, Forest Becker, MD, 220 mg at 07/04/19 0831    Review of Systems:   A comprehensive review of systems was: A 12 point review of systems was conducted and was negative except as above.    Physical Exam:     Visit Vitals  BP 114/60   Pulse 65   Temp 98.2 F (36.8 C) (Oral)   Resp 16   Ht 1.575 m (5\' 2" )   Wt 104.3 kg (230 lb)   LMP  (LMP Unknown)   SpO2 97%   BMI 42.07 kg/m     ~3 x 4 cm wound left upper quadrant of abdomen    General:  No acute distress  Eyes:  Anicteric  Nose: moist  mucus membranes  Oral cavity:   moist mucus membranes  Ears: no otorrhea  Neck: supple  Pulm:  No stridor  CV:  No tachycardia  Abd:  No distension  Ext:  Warm  Neuro:  Moves all 4  Psych:  Normal affect  Derm: as above    Labs Reviewed:   Any pertinent labs were reviewed.    Rads:   Any pertinent radiology was reviewed.    Assessment:   L abdominal wound    Plan:   The patient will not tolerate bedside debridement.  Necrotic material represents fat necrosis.    ESRD and morbid obesity are risk factors for poor wound healing.  WBC 8.8    Continue Dakins dressings changes BID, allow the wound to granulate.  Not ready for closure.    F/u wound clinic.    [id number] (o)    Signed by: Renelda Loma

## 2019-07-04 NOTE — Consults (Signed)
Infectious diseases    Referring physician: Cherylynn Ridges    This is a 64 year old female who was seen in April and May for PD catheter peritonitis with Pseudomonas.    She is now readmitted for a fall.  She has an abdominal wound with MRSA and Pseudomonas    Past medical history  1.  End-stage renal disease  2.  Hypertension  3.  Diabetes  4.  Hyperlipidemia  5.  COPD  6.  Shoulder and knee replacement.    Family history  Positive for diabetes hypertension    Social history  No ethanol or nicotine use    Medications  RenvelaNorvasc/Lipitor baby aspirin Coreg Pletal Lasix gabapentin NovoLog    Allergies  Lisinopril Metformin    Review of systems  No fever  Pain at the areas of injury, right wrist left knee abdominal wall  On dialysis  No nausea vomiting or diarrhea  No cough chest pain shortness breath or wheezing  No angina palpitations  Denies syncope or seizures.    Physical examination  Vital signs: Afebrile blood pressure 102/65 pulse 61  General: Nontoxic  Right wrist splinted  Lungs: Clear  Abdomen soft nontender bowel sounds are heard wound has some grayish necrotic tissue around the edges and base  Heart: Regular rhythm  Neuro: No focal deficits    Microbiology  Wound culture MRSA, sensitive to doxycycline  Pseudomonas sensitive to quinolones    Labs  White count 8.8 hemoglobin 7.9 hematocrit 28.6 platelet count 115,000  BUN 37 creatinine 4.1 CO2 27 lactic acid 1.6  Alkaline phosphatase 134 ALT 10 AST 10 bilirubin 0.6    Assessment  1.  Traumatic injury to abdominal wall with development of abscess  MRSA/Pseudomonas    Recommendations  1.  Treat topically with Dakin solutions  She would benefit from trimming of the gray necrotic material  2.  Continue vancomycin for MRSA  3.  Levaquin for Pseudomonas; changed to p.o.  Thank you

## 2019-07-04 NOTE — OT Progress Note (Signed)
Occupational Therapy Note  [facility]  Occupational Therapy Treatment     Patient: [name]     MRN#: [id number]   Unit: [address] [facility]  Bed: A2314/A2314.01    Time of treatment:  Time Calculation  OT Received On: 07/04/19  Start Time: 1506  Stop Time: 1546  Time Calculation (min): 40 min             Chart Review and Collaboration with Care Team: 3 minutes, not included in above time.    OT Visit Number: 2    Precautions and Contraindications:    Precautions  Other Precautions: contact MRSA    Personal Protective Equipment (PPE)  gloves and procedure mask    Updated Labs:  Lab Results   Component Value Date/Time    HGB 7.9 (L) 07/04/2019 04:19 AM    HCT 26.6 (L) 07/04/2019 04:19 AM    K 4.1 07/04/2019 04:19 AM    NA 137 07/04/2019 04:19 AM    INR 1.1 07/01/2019 10:41 PM    TROPI 0.02 06/23/2019 12:29 PM    TROPI 0.04 05/17/2019 06:20 PM    TROPI 0.05 05/17/2019 01:37 PM    TROPI 0.01 03/18/2006 06:10 PM       All imaging reviewed, please see chart for details.    Subjective:    .  "I'm back again- I was on the other unit and they just transferred me to this room today."  "They said I had an infection.       Patient's medical condition is appropriate for Occupational Therapy intervention at this time.  Patient is agreeable to participation in the therapy session. Nursing clears patient for therapy.  Pain Assessment  Pain Assessment:  (complains of pain in left LE and right wrist/hand)      Objective:  Observation of Patient/Vital Signs:    Stable    Cognition/Neuro Status  Arousal/Alertness: Appropriate responses to stimuli  Attention Span: Appears intact  Orientation Level: Oriented X4  Memory: Appears intact  Following Commands: Follows all commands and directions without difficulty  Safety Awareness: minimal verbal instruction  Insights: Educated in Surveyor, mining  Problem Solving: Assistance required to identify errors made  Behavior: cooperative;calm  Motor Planning:  intact  Coordination: [place] impaired  Hand Dominance: right handed    Functional Mobility  Supine to Sit Transfers: Contact Guard Assist  Sit to Supine Transfers: Contact Guard Assist  Sit to Stand Transfers: Minimal Assist;Contact Guard Assist  Bed to Chair Transfers: Minimal Assist;Contact Guard Assist    Self-care and Home Management  LB Dressing:  (pt uses sock aide at home and has reacher for other garments)  Functional Transfers: Minimal Assist  Pt brushed teeth at edge of bed    Therapeutic Exercises  Shoulder AROM: Flexion (R UE)     Pt completed R shoulder flexion ex's - provided education on importance to keep shoulder mobile even if her right wrist hurts as well as to use her right hand for bilateral activities and to maintain dexterity in fingers.    Tolerated B LE there ex's at edge of bed but complains of left knee pain with AROM.                          Educated the patient to role of occupational therapy, plan of care, goals of therapy and HEP, safety with mobility and ADLs, home safety with verbalized understanding.     Patient  left in bedside chair with alarm and all other medical equipment in place and call bell and all personal items/needs within reach.  RN notified of session outcome.      Assessment:  Pt with improved functional mobility and ADL/activity tolerance today- pt does need encouragement and education on importance of AROM to maintain mobility and prevent contractures.  Goals to be updated.  Would benefit from SNF but may progress to home with continued out of bed activity/therapy and increased safety awareness and education.      PMP Activity: Step 5 - Chair  Distance Walked (ft) (Step 6,7): 0 Feet      Plan:  Goal Formulation: Patient      Time For Goal Achievement: by time of discharge  ADL Goals  Patient will dress upper body: Supervision, Not met  Patient will toilet: Moderate Assist, Not met  Mobility and Transfer Goals  Other Goal: Pt will tolerate OOB ADL and functional  mobility trial within 2 visits in prep for safe [place] planning (goal met)                       Continue plan of care.       DME Recommended for Discharge: [facility], Shower chair (pt has rollator)  Discharge Recommendation: SNF (pt may progress to home with HHOT/HHPT)      Mickel Baas Shaquaya Wuellner OTR/L   07/04/2019 3:56 PM

## 2019-07-04 NOTE — UM Notes (Signed)
07/04/19 ADDITIONAL CLINICAL INFORMATION FOR RECONSIDERATION BY INSURANCE        Criteria Review   Physician Advisor Response:    Chart reviewed in EPIC.  Pt is a 64yo F here with Traumatic injury to abdominal wall with development of abscess; MRSA/Pseudomonas.  Had malfunction of HD catheter s/p replacement.          Recent Labs   Lab 07/04/19  0419 07/03/19  0604 07/02/19  0357   WBC 8.85 13.27* 10.01*   Hgb 7.9* 8.3* 7.8*   Hematocrit 26.6* 27.4* 26.5*   Platelets 118* 118* 115*     RRT on 6/8-9 for:  # Acute encephalopathy: DDx: Infection vs central etiology with repeated fall/? hitting head vs renal failure. Glucose 309. RN expressed her concern for possible substance abuse.   # Fever: 100.2     Receiving IV vanco after HD for concern for wound infection.    Would recommend reconsideration clinicals and p2p with the attending MD, if denial upheld.    Barkley Boards, MD               07/02/19 1500  Admit to Inpatient Once    Diagnosis: Esrd (End Stage Renal Disease) On Dialysis    Level of Care: Acute    Patient Class: Inpatient       References: IAH Bed Placement [place] Bed Placement [place] Bed Placement [place] Bed Placement [place] Bed Placement Criteria   Question Answer Comment   Admitting Physician Cherylynn Ridges A    Service: Medicine    Estimated Length of Stay > or = to 2 midnights    Tentative Discharge Plan? Home or Self Care    Does patient need telemetry? No        07/02/19 1501   07/01/19 0035  Admit to Observation Once    Comments: 25   Diagnosis: Dizziness    Level of Care: Intermediate Care    Patient Class: Observation       References: IAH Bed Placement [place] Bed Placement [place] Bed Placement [place] Bed Placement CriteriaIM[facility] Bed Placement Criteria   Question Answer Comment   Admitting Physician ABDUL Raeford Razor    Service: Medicine    Estimated Length of Stay < 2 midnights    Tentative Discharge Plan?  Home or Self Care    Does patient need telemetry? Yes    Telemetry type (separate Telemetry order is also required): Cardiac telemetry             ER HPI:  64 y.o. female h/o esrd on hd t/th/sat s/p recent discharge, pt had multiple falls at home. Today, she missed the dialysis and came to ED. She has right wrist pain, which is already in a velcro splint.     Patient discharged home 06/29/19 after admission for RUE numbness/pain secondary to triquetral fracture with possible gout/pseudogout flare. At home patient with two mechanical falls, reports tripping over cane, dizziness, and L leg giving out. Endorses head trauma with fall as well as generalized body pain worse in R wrist and L leg. States she was not down long, received help from people in her building.    6/8   100.2 F (37.9 C) Oral 82 95 % -- -- 153/75       6/8 HD:  Tot. Of 2Hrs. HD tx. Done today due to Rt. CVC flow issue. Pt mostly run bet.  250-175 BFR; TPA indwelled x45 mins. As per MD's order- ineffective; MD aware. Will have catheter  change possibly today  and for HD tx. Again tomorrow. Net fluid off 700cc      Date of Service:  07/01/2019 10:43 PM    RAPID RESPONSE TEAM NOTE     [name] is a  64 y.o.  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     The Critical care/ Rapid Response Team was activated on 07/01/2019 at 2221 for AMS.     CC/ROS:  1. AMS:     PHYSICAL EXAMINATION;   VS: T 100.2. BP 153/75. P 81. SpO2 97% on RA  General appearance; NAD.   Assessment/plan:  Patient Active Hospital Problem List: 07/01/19    # Acute encephalopathy: DDx: Infection vs central etiology with repeated fall/? hitting head vs renal failure. Glucose 309. RN expressed  her concern for possible substance abuse.   # Fever:  100.2    -Will send blood culture, UA, wound culture and lactate.   -Will proceed with CXR.  -Will send drug screen. Alcohol was undetected yesterday.  -Holding Abx.  -Will give a dose of tylenol.  -Will check CBC, BMP, PT/INR.     -CT head stat.  -Fall precautions  -PT/OT    6/9 neph prog note:  CC: follow-up CKD     98.1 F (36.7 C) Oral 68 96 % -- 15 121/70       Assessment:     1. CKD- on HD at [facility]  2. Malfunctioning dialysis catheter  3. Volume overload- mild  4. HTN  5. Anemia- of CKD  6. Secondary HPT  7. Abdominal surgical wound- not healing  8. Recurrent falls      Recommendations:     1. Permcath change today  2. HD in am  3. Daily renal panel, cbc  4. Continue Renvela  5. Check ipth  6. Wound care evaluation  7. Stable from  Renal standpoint.     -Neuro checks  -Dysphagia screening.    * CT head; Per report, Stable left posterior parietal soft tissue swelling with no acute  intracranial process.  * CXR; Per report, No acute process  * Labs currently available; No drop in Hb, leukocytosis. Lactate not elevated. Glucose 322, already additional lispro given per RN, Mild elevated K 5.3, renal functions improved after today's dialysis. Other labs pending, primary attending to follow.   * Repeated VS: T 99.9. BP 112/68. RR 19, SpO2 94% on RA.  * Patient still confused.     Scheduled Meds: PRN Meds:    amLODIPine, 5 mg, Oral, Q12H  atorvastatin, 40 mg, Oral, Daily  carvedilol, 25 mg, Oral, Q12H  collagenase, , Topical, Q24H  gabapentin, 300 mg, Oral, Daily  heparin (porcine), 5,000 Units, Subcutaneous, Q8H SCH  insulin glargine, 35 Units, Subcutaneous, QHS  insulin lispro, 5 Units, Subcutaneous, TID MEALS  pantoprazole, 40 mg, Oral, BID  sevelamer, 800 mg, Oral, TID MEALS  sodium hypochlorite, , Irrigation, Daily  vitamin C, 500 mg, Oral, Daily  zinc sulfate, 220 mg, Oral, Daily          Continuous Infusions:   acetaminophen, 650 mg,  Q6H PRN   Or  acetaminophen, 650 mg, Q6H PRN  dextrose, 15 g of glucose, PRN   And  dextrose, 12.5 g, PRN  And  glucagon (rDNA), 1 mg, PRN  melatonin, 3 mg, QHS PRN  naloxone, 0.2 mg, PRN  ondansetron, 4 mg, Q6H PRN   Or  ondansetron, 4 mg, Q6H PRN  oxyCODONE, 5 mg, Q6H PRN              Labs:           Recent Labs   Lab 07/02/19  0357 07/01/19  2241 07/01/19  0347   WBC 10.01* 10.60* 10.99*   Hgb 7.8* 8.1* 8.2*   Hematocrit 26.5* 26.9* 27.8*   Platelets 115* 126* 115*     Scheduled Meds: PRN Meds:    amLODIPine, 5 mg, Oral, Q12H  atorvastatin, 40 mg, Oral, Daily  carvedilol, 25 mg, Oral, Q12H  collagenase, , Topical, Q24H  gabapentin, 300 mg, Oral, Daily  heparin (porcine), 5,000 Units, Subcutaneous, Q8H SCH  insulin glargine, 35 Units, Subcutaneous, QHS  insulin lispro, 5 Units, Subcutaneous, TID MEALS  pantoprazole, 40 mg, Oral, BID  sevelamer, 800 mg, Oral, TID MEALS  sodium hypochlorite, , Irrigation, Daily  vitamin C, 500 mg, Oral, Daily  zinc sulfate, 220 mg, Oral, Daily          Continuous Infusions:   acetaminophen, 650 mg, Q6H PRN   Or  acetaminophen, 650 mg, Q6H PRN  dextrose, 15 g of glucose, PRN   And  dextrose, 12.5 g, PRN   And  glucagon (rDNA), 1 mg, PRN  melatonin, 3 mg, QHS PRN  naloxone, 0.2 mg, PRN  ondansetron, 4 mg, Q6H PRN   Or  ondansetron, 4 mg, Q6H PRN  oxyCODONE, 5 mg, Q6H PRN                6/9 Attending prog note:  Mechanical fall  ESRD on HD  Malfunction of tunneled dialysis catheter  Transient alteration in mental status  Possible wound infection      N.p.o. at midnight  Exchange of tunneled dialysis catheter tomorrow  Pain control  SNF on discharge  Follow-up wound cultures  If T>100.4 for more than an hour or T>101 start broad-spectrum antibiotics overnight  Follow-up blood cultures        Analgesia: Acetaminophen, Dilaudid  Visual hallucinations      Current Facility-Administered Medications   Medication Dose Route Frequency    acetaminophen  1,000 mg Oral TID     amLODIPine  5 mg Oral Q12H    atorvastatin  40 mg Oral Daily    carvedilol  25 mg Oral Q12H    collagenase   Topical Q24H    gabapentin  300 mg Oral Daily    heparin (porcine)  5,000 Units Subcutaneous Q8H [place]    [START ON 07/04/2019] heparin (porcine)  5,000 Units Subcutaneous Q8H [place]    insulin glargine  35 Units Subcutaneous QHS    insulin lispro  5 Units Subcutaneous TID MEALS    pantoprazole  40 mg Oral BID    sevelamer  800 mg Oral TID MEALS    sodium hypochlorite   Irrigation Daily    vitamin C  500 mg Oral Daily    zinc sulfate  220 mg Oral Daily   vancomycin (VANCOCIN) 1500 mg in sodium chloride 0.9% 500 mL (premix)   Dose: 1,500 mg IV    Lab 07/02/19  0357 07/01/19  2241 07/01/19  0347   WBC 10.01* 10.60* 10.99*   RBC 2.79* 2.86* 2.90*   Hgb 7.8* 8.1* 8.2*   Hematocrit 26.5* 26.9* 27.8*   MCV 95.0  94.1 95.9   Platelets 115* 126* 115*               Recent Labs   Lab 07/02/19  0357 07/01/19  2241 07/01/19  0347 06/30/19  2256 06/28/19  0505   Sodium 135* 134* 133* 132* 132*   Potassium 5.1 5.3* 5.3* 5.3* 5.4*   Chloride 101 100 100 97* 96*   CO2 22 22 20* 20* 21*   BUN 61.0* 56.0* 73.0* 71.0* 55.0*   Creatinine 5.4* 5.2* 6.6* 6.7* 5.3*   Glucose 229* 322* 226* 271* 496*   Calcium 8.0* 7.9* 8.2* 8.9 8.2*   Magnesium 1.9  --   --   --   --             Recent Labs   Lab 07/02/19  0357 06/30/19  2256   ALT  --  10   AST (SGOT)  --  10   Bilirubin, Total  --  0.6   Albumin 2.3* 3.0*   Alkaline Phosphatase  --  134*           Recent Labs   Lab 07/01/19  2241   Creatine Kinase (CK) 20*         6/10:Pre-operative Diagnosis: malfunctioning tunneled dialysis catheter    Post-operative Diagnosis: Diagnosis is same as preoperative diagnosis    Procedure(s) Performed:   1. Dialysis catheter venogram  2. Right IJ tunneled dialysis catheter exchange                                                                                                  Anesthesia:  Local with 1% Lidocaine    Complications:  None    Estimated Blood Loss:  None    Blood Aministered:  None    Fluid Aministered:  Per Nursing    Tubes and Drains: None        6/10 MD PROG NOTE:  Mechanical fall  ESRD on HD  Malfunction of tunneled dialysis catheter s/p exchange  Transient alteration in mental status  Possible wound infection      Monitor overnight  Discharge tomorrow if no issues  Follow-up wound cultures  If T>100.4 for more than an hour or T>101 start broad-spectrum antibiotics overnight  Follow-up blood cultures        Analgesia: Acetaminophen, Dilaudid    Nutrition: Renal    DVT Prophylaxis: Heparin       Code Status: DNR    DISPO: Inpatient, discharge once tunneled catheter exchanged    Family Contact: Daughter       SUBJECTIVE     [name] states in pain, back of head, arm.  Wanting pain meds.    6/10-  Drowsy, no acute issues    07/03/19 08:16:39  99.3 F (37.4 C)    68  16  104/66  94 %  2 L /min       6/11 ID CONSULT:  This is a 64 year old female who was seen in April and May for PD catheter peritonitis with Pseudomonas.    She is now readmitted for a  fall.  She has an abdominal wound with MRSA and Pseudomonas    Implant(s): 23cm equistream tunneled dialysis catheter    Specimens: None    Findings:  Tunneled dialysis catheter exchange      Assessment  1.  Traumatic injury to abdominal wall with development of abscess  MRSA/Pseudomonas    Recommendations  1.  Treat topically with Dakin solutions  She would benefit from trimming of the gray necrotic material  2.  Continue IV vancomycin for MRSA  3.  Levaquin for Pseudomonas; changed to p.o.    6/10 HD: UF of 2.5  Liters. Right permcarth worked well. Dressing dry and intact. Lungs clear. No sob noted. Pt. Remained drowsy during treatment. Slight lower extremity edema noted. Telemetry SR. No c/o pain      6/11 NEPH PROG NOTE:  S/p new permcath placement yesterday - tolerated HD well with 2.5L net ultrafiltration  Next HD tomorrow with  ultrafiltration   Abx per ID    Case discussed with: pt, HD RN   Verl Blalock, MD  [facility]  [phone] (office)  X [phone] [facility])      Subjective: due to infected abdominal wall wound, plan to be changed to a private room. Reports being malodorous         Stain, Gram                FINAL    07/02/19 06:02   07/02/19  Modera7 te WBCs        Moderate Gram positive cocci        Moderate Gram negative rods        No Squamous epithelial cells seen   Culture and Gram Stain, Aerobic, Wound   FINAL    07/04/19 09:34  +   07/04/19  Heavy growth of Pseudomonas aeruginosa       07/04/19  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         6/10 permacath  Impression:      Successful right internal jugular tunneled dialysis catheter   exchange          HYDROmorphone (DILAUDID) injection 1 mg   Dose: 1 mg  Freq: Every 3 hours PRN Route: IV  PRN Reason: severe pain-X 1 TODAY      WOUND CONSULT:  Reason for Consult: Surgical wound  Assessment & Plan   WOCN seen patient on 06/01 discharge and back on 06/30/19    Assessment:    Surgical wound  - Present on Admission ( POA) Wound measure approximately 8 cm x 5 cm x 3 cm. The wound bed is 100 %  yellow slough.  Scant serous drainage, no odor. Peri-wound is intact slightly erythema.     Plan: Continue current treatment- Dakin's and Santyl    Abdomen    1. Cleanse wound with Dakin's 1/4 strength. and pat it dry with gauze.   2. Apply thick nickel layer of Santyl to wound bed daily, lightly pack with moistened gauze Dakin's solution.   3. Top with a dry dressing. Protect the edges of a wound with 3 M cavilon no sting barrier film wipes/ spray and allow to dry.   4. Cover with an adhesive foam dressing.        Santyl is a debriding agent ointment containing papain and a proteolytic enzyme. Use to debride necrotic tissue and liquefy slough.    Dakin's to reduce the bacterial burden and  promote healing. Effective against staph, strep,  and Pseudomonas, dissolve necrotic tissue, controls odor.    Initiate/ continue pressure prevention bundle:    Head of the bed 30 degrees or less  Positioning device to the bedside  Eliminate/minimize pressure from the area  Float heels with boots or pillows  Turn patient  Pressure redistribution cushion to the chair  Use lift sheets/low friction surface sheets for positioning  Pad bony prominences  Nutrition consult/Optimize nutrition  Initiate bed algorithm/Specialty bed  Moisture/Incontinence management - Cleanse with incontinence cleansing wipe/water to manage incontinence and to protect skin from exposure to urine and stool. Apply skin barrier protection cream. Apply [place]/external catheter to prevent urinary contamination of the wound. Apply rectal pouch/fecal management system per unit policy, to prevent fecal contamination of a wound or contain diarrhea.    Wound Assessment:        Wound 05/27/19 Surgical Incision Abdomen Other (Comment) 4x4, abd, tape  (Active)   Site Description Dressing covering site (UTA) 07/01/19 1516   Drainage Amount Large 07/01/19 0300   Drainage Description Serosanguinous 07/01/19 0300   Treatments Cleansed 07/01/19 0300   Dressing Foam;Gauze 07/01/19 0300   Dressing Changed New 07/01/19 0300        CBC without differential [[id number]] (Abnormal) Collected: 07/03/19 0604   Specimen: Blood Updated: 07/03/19 0642    WBC 13.27High  x10 3/uL     Hgb 8.3Low  g/dL     Hematocrit 27.4Low  %     Platelets 118Low  x10 3/uL     RBC 2.93Low  x10 6/uL     MCV 93.5 fL     MCH 28.3 pg     MCHC 30.3Low  g/dL      Renal function panel [[id number]] (Abnormal) Collected: 07/03/19 0604   Specimen: Blood Updated: 07/03/19 0707    Glucose 294High  mg/dL     Sodium 135Low  mEq/L     Potassium 6.0High  mEq/L     Chloride 103 mEq/L     CO2 19Low  mEq/L     BUN 70.0High  mg/dL     Calcium 8.1Low  mg/dL     Creatinine 5.9High  mg/dL      Albumin 2.3Low  g/dL       Updated: 07/03/19 2043    Whole Blood Glucose POCT 279High  mg/dL    Glucose Whole Blood - POCT [[id number]] (Abnormal) Collected: 07/03/19 1818    Updated: 07/03/19 1820    Whole Blood Glucose POCT 141High  mg/dL    Glucose Whole Blood - POCT [[id number]] (Abnormal) Collected: 07/03/19 1130    Updated: 07/03/19 1150    Whole Blood Glucose POCT 203High  mg/dL    Glucose Whole Blood - POCT [[id number]] (Abnormal) Collected: 07/03/19 0815    Updated: 07/03/19 0851    Whole Blood Glucose POCT 244High  mg/dL        CBC without differential [[id number]] (Abnormal) Collected: 07/04/19 0419   Specimen: Blood Updated: 07/04/19 0505    WBC 8.85 x10 3/uL     Hgb 7.9Low  g/dL     Hematocrit 26.6Low  %     Platelets 118Low  x10 3/uL     RBC 2.86Low  x10 6/uL     MCV 93.0 fL     MCH 27.6 pg     MCHC 29.7Low  g/dL     RDW 13 %     MPV 12.8High  fL     Nucleated RBC 0.0 /100 WBC      Renal function panel [[id number]] (  Abnormal) Collected: 07/04/19 0419   Specimen: Blood Updated: 07/04/19 0526    Glucose 169High  mg/dL     Sodium 137 mEq/L     Potassium 4.1 mEq/L     Chloride 100 mEq/L     CO2 27 mEq/L     BUN 37.0High  mg/dL     Calcium 8.1Low  mg/dL     Creatinine 4.1High  mg/dL     Albumin 2.2Low  g/dL     Phosphorus 4.9High  mg/dL      Glucose Whole Blood - POCT [[id number]] (Abnormal) Collected: 07/04/19 1642    Updated: 07/04/19 1647    Whole Blood Glucose POCT 215High  mg/dL    Glucose Whole Blood - POCT [[id number]] (Abnormal) Collected: 07/04/19 1247    Updated: 07/04/19 1302    Whole Blood Glucose POCT 272High  mg/dL      07/04/19 08:00:12  97.5 F (36.4 C)    61  16  102/65  98 %

## 2019-07-04 NOTE — Plan of Care (Signed)
Problem: Compromised Tissue integrity  Goal: Damaged tissue is healing and protected  Outcome: Adequate for Discharge     Problem: Neurological Deficit  Goal: Neurological status is stable or improving  Outcome: Adequate for Discharge     Problem: Every Day - Stroke  Goal: Neurological status is stable or improving  Outcome: Adequate for Discharge     Problem: Diabetes: Glucose Imbalance  Goal: Blood glucose stable at established goal  Outcome: Adequate for Discharge

## 2019-07-04 NOTE — Progress Notes (Signed)
Called report to RN receiving patient [name], addressed questions and concerns. Will set up transport.

## 2019-07-05 LAB — CBC
Absolute NRBC: 0 10*3/uL (ref 0.00–0.00)
Hematocrit: 24.6 % — ABNORMAL LOW (ref 34.7–43.7)
Hgb: 7.3 g/dL — ABNORMAL LOW (ref 11.4–14.8)
MCH: 27.9 pg (ref 25.1–33.5)
MCHC: 29.7 g/dL — ABNORMAL LOW (ref 31.5–35.8)
MCV: 93.9 fL (ref 78.0–96.0)
MPV: 13 fL — ABNORMAL HIGH (ref 8.9–12.5)
Nucleated RBC: 0 /100 WBC (ref 0.0–0.0)
Platelets: 118 10*3/uL — ABNORMAL LOW (ref 142–346)
RBC: 2.62 10*6/uL — ABNORMAL LOW (ref 3.90–5.10)
RDW: 13 % (ref 11–15)
WBC: 9.13 10*3/uL (ref 3.10–9.50)

## 2019-07-05 LAB — RENAL FUNCTION PANEL
Albumin: 2.2 g/dL — ABNORMAL LOW (ref 3.5–5.0)
Anion Gap: 14 (ref 5.0–15.0)
BUN: 54 mg/dL — ABNORMAL HIGH (ref 7.0–19.0)
CO2: 24 mEq/L (ref 22–29)
Calcium: 8.1 mg/dL — ABNORMAL LOW (ref 8.5–10.5)
Chloride: 100 mEq/L (ref 100–111)
Creatinine: 5.6 mg/dL — ABNORMAL HIGH (ref 0.6–1.0)
Glucose: 217 mg/dL — ABNORMAL HIGH (ref 70–100)
Phosphorus: 5.6 mg/dL — ABNORMAL HIGH (ref 2.3–4.7)
Potassium: 4.6 mEq/L (ref 3.5–5.1)
Sodium: 138 mEq/L (ref 136–145)

## 2019-07-05 LAB — URINE DRUG TEST,GENERAL TOXICOLOGY
Urine Acetone: NOT DETECTED
Urine Ethanol: 41 mg/dL — ABNORMAL HIGH
Urine Isopropanol: NOT DETECTED
Urine Methanol: NOT DETECTED

## 2019-07-05 LAB — GLUCOSE WHOLE BLOOD - POCT
Whole Blood Glucose POCT: 269 mg/dL — ABNORMAL HIGH (ref 70–100)
Whole Blood Glucose POCT: 112 mg/dL — ABNORMAL HIGH (ref 70–100)

## 2019-07-05 LAB — VANCOMYCIN, RANDOM: Vancomycin Random: 16 ug/mL

## 2019-07-05 LAB — HEMOLYSIS INDEX: Hemolysis Index: -1 — ABNORMAL LOW (ref 0–18)

## 2019-07-05 LAB — MAGNESIUM: Magnesium: 1.9 mg/dL (ref 1.6–2.6)

## 2019-07-05 LAB — GFR: EGFR: 9.2

## 2019-07-05 MED ORDER — SODIUM CHLORIDE 0.9 % IV BOLUS
100.0000 mL | INTRAVENOUS | Status: AC | PRN
Start: 2019-07-05 — End: 2019-07-05

## 2019-07-05 MED ORDER — ALBUMIN HUMAN 25 % IV SOLN
INTRAVENOUS | Status: AC
Start: 2019-07-05 — End: 2019-07-05
  Administered 2019-07-05: 10:00:00 100 mL via INTRAVENOUS
  Filled 2019-07-05: qty 100

## 2019-07-05 MED ORDER — SODIUM CHLORIDE 0.9 % IV BOLUS
250.0000 mL | INTRAVENOUS | Status: AC | PRN
Start: 2019-07-05 — End: 2019-07-05

## 2019-07-05 MED ORDER — ALBUMIN HUMAN 25 % IV SOLN
100.0000 mL | INTRAVENOUS | Status: AC | PRN
Start: 2019-07-05 — End: 2019-07-05
  Filled 2019-07-05: qty 100

## 2019-07-05 MED ORDER — VANCOMYCIN HCL IN NACL 1.5-0.9 GM/500ML-% IV SOLN
1500.00 mg | Freq: Once | INTRAVENOUS | Status: AC
Start: 2019-07-05 — End: 2019-07-05
  Administered 2019-07-05: 14:00:00 1500 mg via INTRAVENOUS
  Filled 2019-07-05: qty 500

## 2019-07-05 NOTE — Progress Notes (Signed)
[facility] PROGRESS NOTE  Aaron Edelman, x [phone] [facility])      Date Time: 07/05/19 2:40 PM  Patient Name: [name]  Attending Physician: Rebecka Apley, MD    CC: follow-up CKD  Dialysis note  Seen on dialysis   Assessment:     1. CKD- on HD at [facility]  2. Malfunctioning dialysis catheter  3. Volume overload- mild  4. HTN  5. Anemia- of CKD  6. Secondary HPT  7. 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 Recurrent falls      Recommendations:     S/p new permcath   - tolerated HD well  Today with  Ultrafiltration    Abx per ID    Case discussed with: pt, HD RN   Laural Golden, MD  [facility]  [phone] (office)  X [phone] ([place])      Subjective:feeling better npo sob     Review of Systems:   Review of Systems - No CP, SOB, dizziness    Physical Exam:     Vitals:    07/05/19 1235 07/05/19 1245 07/05/19 1249 07/05/19 1418   BP: 124/73 118/65 148/78 148/78   Pulse: 60 61 65 65   Resp: 18 18 18     Temp:   97.6 F (36.4 C)    TempSrc:       SpO2: 100% 100% 100%    Weight:       Height:           Intake and Output Summary (Last 24 hours) at Date Time    Intake/Output Summary (Last 24 hours) at 07/05/2019 1440  Last data filed at 07/05/2019 1249  Gross per 24 hour   Intake 250 ml   Output 2500 ml   Net -2250 ml   Dialysis Treatment Data:  Blood flow rate: 350 mL/min  Dialysate flow rate: 700 mL/min  Ultrafiltration rate: 70 mL/hr    Dialysate potassium: 2 mEq/L (Per KPP)  Dialysate calcium: 2.5 mEq/L    Arterial pressure: -220 mmHg  Venous pressure: 153mmHg    General: awake, alert, oriented x 3, no acute distress.  Cardiovascular: regular rate and rhythm  Lungs: clear to auscultation bilaterally, bilateral air entry, normal work of breathing  Abdomen: soft; LLQ wound bandaged  Extremities: trace edema  Other: RIJ Permcath    Meds:      Scheduled Meds: PRN Meds:    acetaminophen, 1,000 mg, Oral, TID  amLODIPine, 5 mg, Oral,  Q12H  atorvastatin, 40 mg, Oral, Daily  carvedilol, 25 mg, Oral, Q12H  collagenase, , Topical, Q24H  gabapentin, 300 mg, Oral, Daily  heparin (porcine), 5,000 Units, Subcutaneous, Q8H SCH  insulin glargine, 35 Units, Subcutaneous, QHS  insulin lispro, 5 Units, Subcutaneous, TID MEALS  [START ON 07/06/2019] levoFLOXacin, 500 mg, Oral, Q48H  pantoprazole, 40 mg, Oral, BID  sevelamer, 800 mg, Oral, TID MEALS  sodium hypochlorite, , Irrigation, Daily  vancomycin, 1,500 mg, Intravenous, Once  vancomycin, , Intravenous, See Admin Instructions  vitamin C, 500 mg, Oral, Daily  zinc sulfate, 220 mg, Oral, Daily          Continuous Infusions:   dextrose, 15 g of glucose, PRN   And  dextrose, 12.5 g, PRN   And  glucagon (rDNA), 1 mg, PRN  HYDROmorphone, 0.4 mg, Q3H PRN  HYDROmorphone, 1 mg, Q3H PRN  melatonin, 3 mg, QHS PRN  naloxone, 0.2 mg, PRN  ondansetron, 4 mg, Q6H PRN   Or  ondansetron, 4 mg, Q6H PRN  oxyCODONE, 5 mg, Q6H PRN              Labs:     Recent Labs   Lab 07/05/19  0514 07/04/19  0419 07/03/19  0604   WBC 9.13 8.85 13.27*   Hgb 7.3* 7.9* 8.3*   Hematocrit 24.6* 26.6* 27.4*   Platelets 118* 118* 118*     Recent Labs   Lab 07/05/19  0514 07/04/19  0419 07/03/19  0604   Sodium 138 137 135*   Potassium 4.6 4.1 6.0*   Chloride 100 100 103   CO2 24 27 19*   BUN 54.0* 37.0* 70.0*   Creatinine 5.6* 4.1* 5.9*   Calcium 8.1* 8.1* 8.1*   Albumin 2.2* 2.2* 2.3*   Phosphorus 5.6* 4.9* 4.7   Magnesium 1.9 1.9 1.8   Glucose 217* 169* 294*   EGFR 9.2 13.2 8.7       Recent Labs   Lab 07/01/19  2241   Urine Type Urine, Clean Ca   Color, UA Yellow   Clarity, UA Hazy   Specific Gravity UA 1.015   Urine pH 6.0   Nitrite, UA Negative   Ketones UA Negative   Urobilinogen, UA Negative   Bilirubin, UA Negative   Blood, UA Negative   RBC, UA 3 - 5   WBC, UA 0 - 5                 Signed by: Laural Golden, MD

## 2019-07-05 NOTE — Progress Notes (Signed)
Pt is in contact isolation,  PPE done per protocol.  Pt alert and oriented.  Pt denied pain and sob. R Permacath access is optimally working.  BP soft Pre-dialysis. Timeouts and safety checks done. Will closely monitor the pt.       07/05/19 0910   Bedside Nurse Communication   Name of bedside RN - pre dialysis Isatu Koita, rn   Treatment Initiation- With Dialysis Precautions   Time Out/Safety Check Completed Yes   Consent for HD signed for this hospitalization (Date) 05/23/19   Consent for HD signed for this hospitalization (Time) 1044   Preferred language Yes   Blood Consent Verified N/A   Dialysis Precautions All Connections Secured;Saline Line Double Clamped;Venous Parameters Set;Arterial Parameters Set;Air Foam Detecctor Engaged   Dialysis Treatment Type Routine;Bedside   Special Considerations Contact isolation   Is patient diabetic? Yes   RO/Hemodialysis Architectural technologist   Is Total Chlorine less than 0.1 ppm? Yes   Orignial Total Chlorine Testing Time 0845   At 4 Hour Total Chlorine Testing Time 1245   RO/Hemodialysis Facilities manager Number 7    Machine Serial Number   [id number])   RO # 27   RO Serial #   [id number])   Water Hardness NA   pH 7   Pressure Test Verified Yes   Alarms Verified Passed   Machine Temperature 98.6 F (37 C)   Alarms Verified Yes   Na+ mEq (Machine) 138 mEq   Bicarb mEq (Machine) 35 mEq   Hemodialysis Conductivity (Machine) 13.8   Hemodialysis Conductivity (Meter) 13.8   Dialyzer Lot Number [id number]   Tubing Lot Number [id number]   RO Machine Log Completed Yes   Hepatitis Status   HBsAg (Antigen) Result Negative   HBsAg Date Drawn 06/12/19   HBsAg Repeat Draw Due Date 07/10/19   Dialysis Weight   Pre-Treatment Weight (Kg) 112   Scale Type ICU Bed Scale   Vitals   Temp 97.6 F (36.4 C)   Heart Rate 64   Resp Rate 18   BP 95/52   SpO2 99 %   O2 Device None (Room air)   Assessment   Mental Status Alert;Oriented   Respiratory  WDL   Respiratory Pattern Regular    Bilateral Breath Sounds Clear;Diminished   RLE Edema +1   LLE Edema +1   General Skin Color Appropriate for ethnicity   Skin Condition/Temp Warm;Dry   Abdomen Inspection Soft   GI Symptoms None   Mobility Ambulatory with Assistance   Permacath Catheter - Tunneled 07/03/19 Right   Placement Date/Time: 07/03/19 1335   Inserted by: Lavone Neri MD  Orientation: Right  Catheter Tip Location: (c) SVC  Central Line Infection Prevention Education provided?: Yes  Hand Hygiene: Chlorhexidine  Line cart used?: No  Line kit used?: Yes  Maximum ...   Line necessity reviewed? Apheresis/hemodialysis   Catheter Lumen Volume Venous 1.7 mL   Catheter Lumen Volume Arterial 1.7 mL   Dressing Status and Intervention Dressing Intact   Tego/Curos Caps on Catheter Yes   Line Used For Blood Draw No   Pain Assessment   Charting Type Assessment   Pain Scale Used Numeric Scale (0-10)   Numeric Pain Scale   Pain Score 0   POSS Score 1   Hemodialysis Comments   Pre-Hemodialysis Comments Timeout and safety checks done

## 2019-07-05 NOTE — Plan of Care (Addendum)
Patient complained of 1 x pain this morning, medicated with prn Dilaudid with good effect. Patient received dialysis today in room, and was well tolerated. Wound care done as ordered. BG monitored, insulin given as scheduled. Vanco trough monitored, vancomycin given as prescribed. Safety bundle maintained, call bell and personal belongings within reach. Will ct to monitor.   Problem: Safety  Goal: Patient will be free from injury during hospitalization  Outcome: Progressing  Flowsheets (Taken 07/05/2019 1658)  Patient will be free from injury during hospitalization:   Assess patient's risk for falls and implement fall prevention plan of care per policy   Provide and maintain safe environment   Use appropriate transfer methods   Ensure appropriate safety devices are available at the bedside   Hourly rounding     Problem: Pain  Goal: Pain at adequate level as identified by patient  Outcome: Progressing  Flowsheets (Taken 07/05/2019 1658)  Pain at adequate level as identified by patient:   Identify patient comfort function goal   Assess for risk of opioid induced respiratory depression, including snoring/sleep apnea. Alert healthcare team of risk factors identified.   Assess pain on admission, during daily assessment and/or before any "as needed" intervention(s)   Reassess pain within 30-60 minutes of any procedure/intervention, per Pain Assessment, Intervention, Reassessment (AIR) Cycle   Evaluate if patient comfort function goal is met   Evaluate patient's satisfaction with pain management progress   Offer non-pharmacological pain management interventions     Problem: Diabetes: Glucose Imbalance  Goal: Blood glucose stable at established goal  Outcome: Progressing  Flowsheets (Taken 07/05/2019 1658)  Blood glucose stable at established goal:   Monitor lab values   Monitor intake and output.  Notify LIP if urine output is < 30 mL/hour.   Follow fluid restrictions/IV/PO parameters   Include patient/family  in decisions related to nutrition/dietary selections   Assess for hypoglycemia /hyperglycemia   Monitor/assess vital signs   Ensure appropriate diet and assess tolerance   Coordinate medication administration with meals, as indicated   Ensure adequate hydration   Ensure appropriate consults are obtained (Nutrition, Diabetes Education, and Case Management)   Ensure patient/family has adequate teaching materials     Problem: Renal Instability  Goal: Free from infection  Outcome: Progressing  Flowsheets (Taken 07/05/2019 1658)  Free from infection:   Monitor/assess for signs and symptoms of infection   Assess need for indwelling catheter every shift and discuss with LIP

## 2019-07-05 NOTE — Progress Notes (Signed)
[facility]  PROGRESS NOTE      Patient: [name]  Date: 07/05/2019   LOS: 3 Days  Admission Date: 06/30/2019   MRN: [id number]  Attending: Rebecka Apley  Please contact me on the following Spectralink [phone]       ASSESSMENT/PLAN     [name] is a 64 y.o. female admitted with Dizziness    Interval Summary:     Initially presented with mechanical fall.  Then had issue with her tunneled dialysis catheter and had that exchanged on 6/10.  On night of 6/8 patient had febrile episode with confusion.  Had wound culture sent for him her chronic abdominal wound.  Grew MRSA and Pseudomonas.  Consulted ID and started on IV vancomycin along with oral Levaquin.  Plastic surgery was consulted and recommended continued wound care with [facility] solutions.  Plastic surgery to follow peripherally.  Continued IV antibiotics.    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 Hgb in 7 range today. Iron profile suggestive of AOCD  Transient alteration in mental status  MRSA and Pseudomonas Abdominal wall abscess  Essential hypertension  Hyperlipidemia  Neuropathy, NOS  DM2        Hemodialysis today  ID consulted: Appreciate their recommendations  Continue  IV vancomycin and oral Levaquin  Plastic surgery consultation: Appreciate recommendations  PT/OT: SNF  Continue pain control  -Acetaminophen 1000 mg 3 times daily  -Oxycodone 5 mg every 6 hours as needed moderate pain  -Dilaudid 0.4 mg every 3 hours as needed moderate pain (second line)  -Dilaudid 1 mg  every 3 hours as needed for severe pain  Continue bowel regimen with Colace and MiraLAX  Continue Norvasc  Continue Lipitor  Continue carvedilol  SSI and adjust accordingly with Lantus  Continue Protonix  Monitor H&H        Analgesia: Acetaminophen, Dilaudid    Nutrition: Renal    DVT Prophylaxis: Heparin       Code Status: DNR    DISPO: Inpatient    Family Contact: Daughter       [place] states in pain, back of head, arm.  Wanting pain meds.    6/10-  Drowsy, no acute issues    6/11-concerned about her abdominal wound.  Discussed culture results.  Pain minimal.  No other issues.    6/12 - Patient complains of some pain in abdominal wound. No complaints of fever, chills, sob.  Tolerating diet.    MEDICATIONS     Current Facility-Administered Medications   Medication Dose Route Frequency    acetaminophen  1,000 mg Oral TID    amLODIPine  5 mg Oral Q12H    atorvastatin  40 mg Oral Daily    carvedilol  25 mg Oral Q12H    collagenase   Topical Q24H    gabapentin  300 mg Oral Daily    heparin (porcine)  5,000  Units Subcutaneous Q8H [place]    insulin glargine  35 Units Subcutaneous QHS    insulin lispro  5 Units Subcutaneous TID MEALS    [START ON 07/06/2019] levoFLOXacin  500 mg Oral Q48H    pantoprazole  40 mg Oral BID    sevelamer  800 mg Oral TID MEALS    sodium hypochlorite   Irrigation Daily    vancomycin   Intravenous See Admin Instructions    vitamin C  500 mg Oral Daily    zinc sulfate  220 mg Oral Daily       PHYSICAL EXAM     Vitals:    07/05/19 1719   BP: 108/62   Pulse: 61   Resp: 19   Temp: 98.2 F (36.8 C)   SpO2: 98%       Temperature: Temp  Min: 97.6 F (36.4 C)  Max: 98.4 F (36.9 C)  Pulse: Pulse  Min: 57  Max: 67  Respiratory: Resp  Min: 14  Max: 19  Non-Invasive BP: BP  Min: 95/52  Max: 148/78  Pulse Oximetry SpO2  Min: 96 %  Max: 100 %    Intake and Output Summary (Last 24 hours) at Date Time    Intake/Output Summary (Last 24 hours) at 07/05/2019 1738  Last  data filed at 07/05/2019 1249  Gross per 24 hour   Intake 250 ml   Output 2500 ml   Net -2250 ml         GEN APPEARANCE: Normal;  A&OX3  HEENT: PERLA; EOMI; Conjunctiva Clear  NECK: Supple; No bruits  CVS: RRR, S1, S2; No M/G/R  LUNGS: CTAB; No Wheezes; No Rhonchi: No rales  ABD: Left upper quadrant abdominal wound with no drainage; packing in place  EXT: No edema; Pulses 2+ and intact  Skin exam:  pink  NEURO: CN 2-12 intact; No Focal neurological deficits  CAP REFILL:  Normal  MENTAL STATUS:  Normal    LABS     Recent Labs   Lab 07/05/19  0514 07/04/19  0419 07/03/19  0604   WBC 9.13 8.85 13.27*   RBC 2.62* 2.86* 2.93*   Hgb 7.3* 7.9* 8.3*   Hematocrit 24.6* 26.6* 27.4*   MCV 93.9 93.0 93.5   Platelets 118* 118* 118*       Recent Labs   Lab 07/05/19  0514 07/04/19  0419 07/03/19  0604 07/02/19  0357 07/01/19  2241   Sodium 138 137 135* 135* 134*   Potassium 4.6 4.1 6.0* 5.1 5.3*   Chloride 100 100 103 101 100   CO2 24 27 19* 22 22   BUN 54.0* 37.0* 70.0* 61.0* 56.0*   Creatinine 5.6* 4.1* 5.9* 5.4* 5.2*   Glucose 217* 169* 294* 229* 322*   Calcium 8.1* 8.1* 8.1* 8.0* 7.9*   Magnesium 1.9 1.9 1.8 1.9  --        Recent Labs   Lab 07/05/19  0514 07/04/19  0419 07/03/19  0604 07/02/19  0357 06/30/19  2256   ALT  --   --   --   --  10   AST (SGOT)  --   --   --   --  10   Bilirubin, Total  --   --   --   --  0.6   Albumin 2.2* 2.2* 2.3*  More results in Results Review 3.0*   Alkaline Phosphatase  --   --   --   --  134*   More  results in Results Review = values in this interval not displayed.       Recent Labs   Lab 07/01/19  2241   Creatine Kinase (CK) 20*       Recent Labs   Lab 07/01/19  2241   PT INR 1.1   PT 12.7       Microbiology Results     Procedure Component Value Units Date/Time    COVID-19 (SARS-COV-2) [employer] [facility]) [[id number]] Collected: 06/30/19 2256    Specimen: Nasopharyngeal Swab from Nasopharynx Updated: 06/30/19 2341     Purpose of COVID testing Screening     SARS-CoV-2 Specimen Source Nasopharyngeal      SARS CoV 2 Overall Result Negative     Comment: Test performed using the Abbott ID NOW EUA assay.  Please see Fact Sheets for patients and providers located at:  http://olson-hall.info/  This test is for the qualitative detection of SARS-CoV-2  (COVID19) nucleic acid. Viral nucleic acids may persist in vivo,  independent of viability. Detection of viral nucleic acid does  not imply the presence of infectious virus, or that virus  nucleic acid is the cause of clinical symptoms. Negative  results should be treated as presumptive and, if inconsistent  with clinical signs and symptoms or necessary for patient  management, should be tested with an alternative molecular  assay. Negative results do not preclude SARS-CoV-2 infection  and should not be used as the sole basis for patient  management decisions. Invalid results may be due to inhibiting  substances in the specimen and recollection should occur.         Narrative:      Screening    CULTURE BLOOD AEROBIC AND ANAEROBIC [[id number]] Collected: 07/01/19 2340    Specimen: Blood, Venipuncture Updated: 07/02/19 0454    Narrative:      1 BLUE+1 PURPLE    CULTURE BLOOD AEROBIC AND ANAEROBIC [[id number]] Collected: 07/01/19 2340    Specimen: Blood, Venipuncture Updated: 07/02/19 0454    Narrative:      1 BLUE+1 PURPLE    Culture + Gram Stain,Aerobic, Wound [[id number]] Collected: 07/02/19 0009    Specimen: Wound from Discharge Updated: 07/02/19 0602    Narrative:      ORDER#: [id number]                                    ORDERED BY: Sheppard Coil  SOURCE: Discharge abdominal wound                    COLLECTED:  07/02/19 00:09  ANTIBIOTICS AT COLL.:                                RECEIVED :  07/02/19 04:57  Stain, Gram                                FINAL       07/02/19 06:02  07/02/19   Moderate WBCs             Moderate Gram positive cocci             Moderate Gram negative rods             No Squamous epithelial cells seen  Culture and Gram Stain, Aerobic,  Wound  PENDING             Wrist Right PA Lateral And Oblique    Result Date: 06/30/2019   4 views of the right wrist were compared to a similar study from 06/25/2019. No new fractures seen. No malalignment. Soft tissue swelling present. Atherosclerosis. Lorretta Harp, DO  06/30/2019 11:28 PM    CT Head WO Contrast    Result Date: 07/01/2019  Stable left posterior parietal soft tissue swelling with no acute intracranial process. Delaney Meigs, MD  07/01/2019 11:48 PM    CT Head without Contrast    Result Date: 06/30/2019   No acute intracranial findings demonstrated. No acute skull fracture. Posterior scalp hematoma. Lorretta Harp, DO  06/30/2019 11:43 PM    CT Cervical Spine without Contrast    Result Date: 06/30/2019  No evidence of acute cervical spine fracture or malalignment. Gillermina Phy, MD  06/30/2019 11:56 PM    XR Chest AP Portable    Result Date: 07/02/2019  No acute process. Delaney Meigs, MD  07/02/2019 12:36 AM    Tunneled Cath Check/Change (Permcath)    Result Date: 07/04/2019   Successful right internal jugular tunneled dialysis catheter exchange Nelma Rothman, MD  07/03/2019 2:21 PM        Echo Results     None          RADIOLOGY     Upon my review:    Signed,  Rebecka Apley  5:38 PM 07/05/2019

## 2019-07-05 NOTE — Progress Notes (Signed)
Patient a/o x 4. Denies sob or dizziness. Lung sounds clear. Scheduled meds given as prsecribed. Patient has permacath for dialysis, dressing clean and intact. Twice daily wound dressing with dakins solution. Pain management with prn pain meds. Iv dilaudid given at bedtime. Patient has external catheter in place. Blood glucose monitoring. Adequate hydration and good appetite. Safety precautions in place.

## 2019-07-05 NOTE — Progress Notes (Signed)
Progress Note- Pharmacy Vancomycin Dosing Consult:  Day 2  Vancomycin Indication: Wound infection  Vancomycin Target Trough Level:  15 - 25 mg/L pre-dialysis for hemodialysis patients    Pertinent Cultures:     Date Source  Organism & Pertinent Susceptibilities     6/8 Blood cx (x2) NG x 3 days   6/9 Wound cx  MRSA, Pseudomonas aeruginosa             As appropriate, contact physician to consider change in therapy if cultures grow organism other than MRSA     Serum creatinine: 5.6 mg/dL (H) 07/05/19 0514  Estimated creatinine clearance: 11.5 mL/min (A)    Baseline SCr: on HD TThSat    Nephrotoxic drugs administered in the past 48 hours: None    Assessment  Measured Vancomycin Level:   Recent Labs   Lab 07/05/19  0514   Vancomycin Random 16.0         Recommendations:  Random Vancomycin pre-dialysis level 16 mcg/ml within therapeutic goal range  Will re-dose Vancomycin 1500 mg x 1 today  Ordered random Vancomycin level with am labs on Tue (07/08/19)    Thank you,  Clelia Schaumann, PharmD [facility]CP

## 2019-07-05 NOTE — Plan of Care (Signed)
Problem: Renal Instability  Goal: Fluid and electrolyte balance are achieved/maintained  Outcome: Progressing  Flowsheets (Taken 07/05/2019 1014)  Fluid and electrolyte balance are achieved/maintained:   Monitor/assess lab values and report abnormal values   Assess for confusion/personality changes   Monitor daily weight  Goal: Free from infection  Outcome: Progressing  Flowsheets (Taken 07/05/2019 1014)  Free from infection: Monitor/assess for signs and symptoms of infection     Problem: Patient Receiving Advanced Renal Therapies  Goal: Therapy access site remains intact  Outcome: Progressing  Flowsheets (Taken 07/05/2019 1014)  Therapy access site remains intact:   Assess therapy access site   Change therapy access site dressing as needed

## 2019-07-05 NOTE — Plan of Care (Signed)
Problem: Pain  Goal: Pain at adequate level as identified by patient  Outcome: Progressing  Flowsheets (Taken 07/05/2019 0203)  Pain at adequate level as identified by patient:   Identify patient comfort function goal   Assess for risk of opioid induced respiratory depression, including snoring/sleep apnea. Alert healthcare team of risk factors identified.   Assess pain on admission, during daily assessment and/or before any "as needed" intervention(s)   Reassess pain within 30-60 minutes of any procedure/intervention, per Pain Assessment, Intervention, Reassessment (AIR) Cycle   Evaluate if patient comfort function goal is met   Offer non-pharmacological pain management interventions   Consult/collaborate with Physical Therapy, Occupational Therapy, and/or Speech Therapy   Include patient/patient care companion in decisions related to pain management as needed   Evaluate patient's satisfaction with pain management progress   Consult/collaborate with Pain Service     Problem: Compromised Tissue integrity  Goal: Damaged tissue is healing and protected  Outcome: Progressing  Flowsheets (Taken 07/05/2019 0203)  Damaged tissue is healing and protected:   Monitor/assess Braden scale every shift   Reposition patient every 2 hours and as needed unless able to reposition self   Increase activity as tolerated/progressive mobility   Relieve pressure to bony prominences for patients at moderate and high risk   Keep intact skin clean and dry   Use incontinence wipes for cleaning urine, stool and caustic drainage. Foley care as needed   Monitor external devices/tubes for correct placement to prevent pressure, friction and shearing   Encourage use of lotion/moisturizer on skin   Consult/collaborate with wound care nurse   Consider placing an indwelling catheter if incontinence interferes with healing of stage 3 or 4 pressure injury   Utilize specialty bed   Monitor patient's hygiene practices   Use bath wipes, not soap and water,  for daily bathing   Avoid shearing injuries   Provide wound care per wound care algorithm     Problem: Diabetes: Glucose Imbalance  Goal: Blood glucose stable at established goal  Outcome: Progressing  Flowsheets (Taken 07/05/2019 0203)  Blood glucose stable at established goal:   Monitor lab values   Follow fluid restrictions/IV/PO parameters   Assess for hypoglycemia /hyperglycemia   Coordinate medication administration with meals, as indicated   Ensure adequate hydration   Ensure patient/family has adequate teaching materials   Ensure appropriate consults are obtained (Nutrition, Diabetes Education, and Case Management)   Ensure appropriate diet and assess tolerance   Monitor/assess vital signs   Include patient/family in decisions related to nutrition/dietary selections   Monitor intake and output.  Notify LIP if urine output is < 30 mL/hour.     Problem: Infection  Goal: Free from infection  Outcome: Progressing  Flowsheets (Taken 07/05/2019 0203)  Free from infection:   Assess for signs/symptoms of infection   Consult/collaborate with Infection Preventionist   Utilize isolation precautions per protocol/policy   Assess immunization status   Utilize sepsis protocol

## 2019-07-05 NOTE — Progress Notes (Signed)
Pt is contact isolation,  PPE done per protocol.  Pt alert and oriented. Pt denied pain and sob. No other complain given. Permacath access is optimally working post dialysis. Catheter dressing changed. Pt's BP soft during tx, albumin 25% given as ordered for BP support.  Hemodialysis ended and pt. able to finish 3.5 hrs tx.  Able to removed Net Fluid of 2.5L.  Reports were given to Primary nurse.       07/05/19 1249   Treatment Summary   Time Off Machine 1245   Duration of Treatment (Hours) 3.5   Treatment Type 1:1   Dialyzer Clearance Moderately streaked   Fluid Volume Off (mL) 3000   Prime Volume (mL) 200   Rinseback Volume (mL) 200   Fluid Given: Normal Saline (mL) 0   Fluid Given: PRBC  0 mL   Fluid Given: Albumin (mL) 100   Fluid Given: Other (mL) 0   Total Fluid Given 500   Hemodialysis Net Fluid Removed 2500   Post Treatment Assessment   Post-Treatment Weight (Kg) 109.5   Patient Response to Treatment fairly tolerated   Additional Dialyzer Used 0   Permacath Catheter - Tunneled 07/03/19 Right   Placement Date/Time: 07/03/19 1335   Inserted by: Lavone Neri MD  Orientation: Right  Catheter Tip Location: (c) SVC  Central Line Infection Prevention Education provided?: Yes  Hand Hygiene: Chlorhexidine  Line cart used?: No  Line kit used?: Yes  Maximum ...   Line necessity reviewed? Apheresis/hemodialysis   Catheter Lumen Volume Venous 1.7 mL   Catheter Lumen Volume Arterial 1.7 mL   Dressing Status and Intervention Dressing Intact;New Dressing;Clean & Dry   Tego/Curos Caps on Catheter Yes   NEW Tego/Curos Caps placed (Date) 07/05/19   End Caps Free From Blood Yes   Line Care Connections checked and tightened   Dressing Type Transparent;Biopatch   Dressing Status Clean;Dry;Intact   Dressing Change Due 07/11/19   Vitals   Temp 97.6 F (36.4 C)   Heart Rate 65   Resp Rate 18   BP 148/78   SpO2 100 %   O2 Device None (Room air)   Assessment   Mental Status Alert;Oriented   Respiratory  WDL   Respiratory Pattern Regular    Bilateral Breath Sounds Clear;Diminished   RLE Edema +1   LLE Edema +1   General Skin Color Appropriate for ethnicity   Skin Condition/Temp Warm;Dry   Abdomen Inspection Soft   GI Symptoms None   Mobility Ambulatory with Assistance   Pain Assessment   Charting Type Assessment   Pain Scale Used Numeric Scale (0-10)   Numeric Pain Scale   Pain Score 0   POSS Score 1   Education   Person taught Patient   Knowledge basis Substantial   Topics taught Procedure   Teaching Tools Explain   Reponse Verbalizes Understanding   Bedside Nurse Communication   Name of bedside RN - post dialysis Venia Minks, RN

## 2019-07-06 LAB — RENAL FUNCTION PANEL
Albumin: 2.6 g/dL — ABNORMAL LOW (ref 3.5–5.0)
Anion Gap: 11 (ref 5.0–15.0)
BUN: 27 mg/dL — ABNORMAL HIGH (ref 7.0–19.0)
CO2: 25 mEq/L (ref 22–29)
Calcium: 8 mg/dL — ABNORMAL LOW (ref 8.5–10.5)
Chloride: 102 mEq/L (ref 100–111)
Creatinine: 3.9 mg/dL — ABNORMAL HIGH (ref 0.6–1.0)
Glucose: 227 mg/dL — ABNORMAL HIGH (ref 70–100)
Phosphorus: 3.6 mg/dL (ref 2.3–4.7)
Potassium: 4 mEq/L (ref 3.5–5.1)
Sodium: 138 mEq/L (ref 136–145)

## 2019-07-06 LAB — CBC
Absolute NRBC: 0 10*3/uL (ref 0.00–0.00)
Hematocrit: 24.8 % — ABNORMAL LOW (ref 34.7–43.7)
Hgb: 7.3 g/dL — ABNORMAL LOW (ref 11.4–14.8)
MCH: 27.7 pg (ref 25.1–33.5)
MCHC: 29.4 g/dL — ABNORMAL LOW (ref 31.5–35.8)
MCV: 93.9 fL (ref 78.0–96.0)
MPV: 13 fL — ABNORMAL HIGH (ref 8.9–12.5)
Nucleated RBC: 0 /100 WBC (ref 0.0–0.0)
Platelets: 122 10*3/uL — ABNORMAL LOW (ref 142–346)
RBC: 2.64 10*6/uL — ABNORMAL LOW (ref 3.90–5.10)
RDW: 13 % (ref 11–15)
WBC: 8.75 10*3/uL (ref 3.10–9.50)

## 2019-07-06 LAB — GFR: EGFR: 14

## 2019-07-06 LAB — HEMOLYSIS INDEX: Hemolysis Index: 1 (ref 0–18)

## 2019-07-06 LAB — GLUCOSE WHOLE BLOOD - POCT
Whole Blood Glucose POCT: 179 mg/dL — ABNORMAL HIGH (ref 70–100)
Whole Blood Glucose POCT: 64 mg/dL — ABNORMAL LOW (ref 70–100)
Whole Blood Glucose POCT: 111 mg/dL — ABNORMAL HIGH (ref 70–100)
Whole Blood Glucose POCT: 140 mg/dL — ABNORMAL HIGH (ref 70–100)
Whole Blood Glucose POCT: 243 mg/dL — ABNORMAL HIGH (ref 70–100)

## 2019-07-06 LAB — MAGNESIUM: Magnesium: 1.7 mg/dL (ref 1.6–2.6)

## 2019-07-06 MED ORDER — SODIUM CHLORIDE 0.9 % IV BOLUS
250.0000 mL | INTRAVENOUS | Status: AC | PRN
Start: 2019-07-06 — End: 2019-07-07

## 2019-07-06 MED ORDER — SODIUM CHLORIDE 0.9 % IV BOLUS
100.0000 mL | INTRAVENOUS | Status: AC | PRN
Start: 2019-07-06 — End: 2019-07-07

## 2019-07-06 MED ORDER — ALBUMIN HUMAN 25 % IV SOLN
100.0000 mL | INTRAVENOUS | Status: AC | PRN
Start: 2019-07-06 — End: 2019-07-07
  Filled 2019-07-06: qty 100

## 2019-07-06 MED ORDER — INSULIN GLARGINE 100 UNIT/ML SC SOLN
30.00 [IU] | Freq: Every evening | SUBCUTANEOUS | Status: DC
Start: 2019-07-06 — End: 2019-07-07
  Administered 2019-07-06: 21:00:00 30 [IU] via SUBCUTANEOUS
  Filled 2019-07-06 (×2): qty 30

## 2019-07-06 NOTE — Plan of Care (Signed)
Problem: Safety  Goal: Patient will be free from injury during hospitalization  Flowsheets (Taken 07/06/2019 0437)  Patient will be free from injury during hospitalization:   Assess patient's risk for falls and implement fall prevention plan of care per policy   Provide and maintain safe environment   Ensure appropriate safety devices are available at the bedside   Include patient/ family/ care giver in decisions related to safety   Assess for patients risk for elopement and implement [place] per policy   Provide alternative method of communication if needed (communication boards, writing)   Use appropriate transfer methods   Hourly rounding     Problem: Renal Instability  Goal: Fluid and electrolyte balance are achieved/maintained  Flowsheets (Taken 07/06/2019 0437)  Fluid and electrolyte balance are achieved/maintained:   Monitor intake and output every shift   Provide adequate hydration   Assess and reassess fluid and electrolyte status   Observe for seizure activity and initiate seizure precautions if indicated   Monitor for muscle weakness   Monitor daily weight   Monitor/assess lab values and report abnormal values   Assess for confusion/personality changes   Observe for cardiac arrhythmias   Follow fluid restrictions/IV/PO parameters     Problem: Diabetes: Glucose Imbalance  Goal: Blood glucose stable at established goal  Flowsheets (Taken 07/06/2019 0437)  Blood glucose stable at established goal:   Monitor lab values   Follow fluid restrictions/IV/PO parameters   Assess for hypoglycemia /hyperglycemia   Monitor intake and output.  Notify LIP if urine output is < 30 mL/hour.   Include patient/family in decisions related to nutrition/dietary selections   Monitor/assess vital signs   Coordinate medication administration with meals, as indicated   Ensure adequate hydration   Ensure appropriate consults are obtained (Nutrition, Diabetes Education, and Case Management)   Ensure patient/family has  adequate teaching materials     Problem: Inadequate Cardiac Output  Goal: Adequate tissue perfusion will be maintained  Flowsheets (Taken 07/06/2019 0437)  Adequate tissue perfusion will be maintained:   Monitor/assess vital signs   Monitor/assess neurovascular status (pulses, capillary refill, pain, paresthesia, paralysis, presence of edema)   Monitor intake and output   Monitor/assess for signs of VTE (edema of calf/thigh redness, pain)   Monitor/assess lab values and report abnormal values   Pt alert and oriented x4. Slept most the night., hourly  rounding done, safety, pt dose not c/o no pain, SOB or chest pain. She breathing on room air and no tele, will continue to follow plan of care.

## 2019-07-06 NOTE — Progress Notes (Signed)
[facility]  PROGRESS NOTE      Patient: [name]  Date: 07/06/2019   LOS: 4 Days  Admission Date: 06/30/2019   MRN: [id number]  Attending: Rebecka Apley  Please contact me on Epic Secure Chat       ASSESSMENT/PLAN     [name] is a 64 y.o. female admitted with Dizziness    Interval Summary:     Initially presented with mechanical fall.  Then had issue with her tunneled dialysis catheter and had that exchanged on 6/10.  On night of 6/8 patient had febrile episode with confusion.  Had wound culture sent for him her chronic abdominal wound.  Grew MRSA and Pseudomonas.  Consulted ID and started on IV vancomycin along with oral Levaquin.  Plastic surgery was consulted and recommended continued wound care with [facility] solutions.  Plastic surgery to follow peripherally.  Continued IV antibiotics.    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 Hgb in 7 range today. Iron profile suggestive of AOCD  Transient alteration in mental status - mental status back at baseline  Essential hypertension  Hyperlipidemia  Neuropathy, NOS  DM2  Hypoglycemia = BG 64 this AM; asymptomatic      Hemodialysis yesterday and next session is on 07/07/19 and patient to resume HD at [facility] after discharge  ID consulted: Appreciate their recommendations  Continue  IV vancomycin and oral Levaquin  Plastic surgery consultation: Appreciate recommendations. Continue dakins dressing changes BID, allow the wound to granulate and it is not ready for closure. Follow up  wound clinic    PT/OT: SNF  Continue pain control  -Acetaminophen 1000 mg 3 times daily  -Oxycodone 5 mg every 6 hours as needed moderate pain  -Dilaudid 0.4 mg every 3 hours as needed severe pain  Continue bowel regimen with Colace and MiraLAX  Continue Norvasc  Continue Lipitor  Continue carvedilol  Reduce lantus to 30 units due to BG 64 in AM  SSI and adjust accordingly   Continue Protonix  Monitor H&H        Analgesia: Acetaminophen, Dilaudid    Nutrition: Renal    DVT Prophylaxis: Heparin       Code Status: DNR    DISPO: Inpatient    Family Contact: Daughter       Brawley states in pain, back of head, arm.  Wanting pain meds.    6/10-  Drowsy, no acute issues    6/11-concerned about her abdominal wound.  Discussed culture results.  Pain minimal.  No other issues.    6/12 - Patient complains of some pain in abdominal wound. No complaints of fever, chills, sob.  Tolerating diet.    6/13 - Patient complains of abdominal pain at wound site. She has no complaints of fever, chills  She had a BM this AM. She had OJ when BG was 64  MEDICATIONS  Current Facility-Administered Medications   Medication Dose Route Frequency    acetaminophen  1,000 mg Oral TID    amLODIPine  5 mg Oral Q12H    atorvastatin  40 mg Oral Daily    carvedilol  25 mg Oral Q12H    collagenase   Topical Q24H    gabapentin  300 mg Oral Daily    heparin (porcine)  5,000 Units Subcutaneous Q8H [place]    insulin glargine  35 Units Subcutaneous QHS    insulin lispro  5 Units Subcutaneous TID MEALS    levoFLOXacin  500 mg Oral Q48H    pantoprazole  40 mg Oral BID    sevelamer  800 mg Oral TID MEALS    sodium hypochlorite   Irrigation Daily    vancomycin   Intravenous See Admin Instructions    vitamin C  500 mg Oral Daily    zinc sulfate  220 mg Oral Daily       PHYSICAL EXAM     Vitals:    07/06/19 1119   BP: 119/58   Pulse: 66   Resp: 18   Temp: 98.1 F (36.7 C)   SpO2: 96%       Temperature: Temp  Min: 98.1 F  (36.7 C)  Max: 99 F (37.2 C)  Pulse: Pulse  Min: 61  Max: 73  Respiratory: Resp  Min: 16  Max: 19  Non-Invasive BP: BP  Min: 108/65  Max: 132/75  Pulse Oximetry SpO2  Min: 96 %  Max: 99 %    Intake and Output Summary (Last 24 hours) at Date Time    Intake/Output Summary (Last 24 hours) at 07/06/2019 1424  Last data filed at 07/06/2019 0900  Gross per 24 hour   Intake 640 ml   Output 100 ml   Net 540 ml         GEN APPEARANCE: Normal;  A&OX3  HEENT: PERLA; EOMI; Conjunctiva Clear  NECK: Supple; No bruits  CVS: RRR, S1, S2; No M/G/R  LUNGS: CTAB; No Wheezes; No Rhonchi: No rales  ABD: Left upper quadrant abdominal wound bandage with no drainage; packing in place  EXT: No edema; Pulses 2+ and intact  Skin exam:  pink  NEURO: CN 2-12 intact; No Focal neurological deficits  CAP REFILL:  Normal  MENTAL STATUS:  Normal    LABS     Recent Labs   Lab 07/06/19  0332 07/05/19  0514 07/04/19  0419   WBC 8.75 9.13 8.85   RBC 2.64* 2.62* 2.86*   Hgb 7.3* 7.3* 7.9*   Hematocrit 24.8* 24.6* 26.6*   MCV 93.9 93.9 93.0   Platelets 122* 118* 118*       Recent Labs   Lab 07/06/19  0332 07/05/19  0514 07/04/19  0419 07/03/19  0604 07/02/19  0357   Sodium 138 138 137 135* 135*   Potassium 4.0 4.6 4.1 6.0* 5.1   Chloride 102 100 100 103 101   CO2 25 24 27  19* 22   BUN 27.0* 54.0* 37.0* 70.0* 61.0*   Creatinine 3.9* 5.6* 4.1* 5.9* 5.4*   Glucose 227* 217* 169* 294* 229*   Calcium 8.0* 8.1* 8.1* 8.1* 8.0*   Magnesium 1.7 1.9 1.9 1.8 1.9       Recent Labs   Lab 07/06/19  0332 07/05/19  0514 07/04/19  0419 07/02/19  0357 06/30/19  2256   ALT  --   --   --   --  10  AST (SGOT)  --   --   --   --  10   Bilirubin, Total  --   --   --   --  0.6   Albumin 2.6* 2.2* 2.2*  More results in Results Review 3.0*   Alkaline Phosphatase  --   --   --   --  134*   More results in Results Review = values in this interval not displayed.       Recent Labs   Lab 07/01/19  2241   Creatine Kinase (CK) 20*       Recent Labs   Lab 07/01/19  2241   PT INR 1.1    PT 12.7       Microbiology Results     Procedure Component Value Units Date/Time    COVID-19 (SARS-COV-2) [employer] Rapid) [[id number]] Collected: 06/30/19 2256    Specimen: Nasopharyngeal Swab from Nasopharynx Updated: 06/30/19 2341     Purpose of COVID testing Screening     SARS-CoV-2 Specimen Source Nasopharyngeal     SARS CoV 2 Overall Result Negative     Comment: Test performed using the Abbott ID NOW EUA assay.  Please see Fact Sheets for patients and providers located at:  http://olson-hall.info/  This test is for the qualitative detection of SARS-CoV-2  (COVID19) nucleic acid. Viral nucleic acids may persist in vivo,  independent of viability. Detection of viral nucleic acid does  not imply the presence of infectious virus, or that virus  nucleic acid is the cause of clinical symptoms. Negative  results should be treated as presumptive and, if inconsistent  with clinical signs and symptoms or necessary for patient  management, should be tested with an alternative molecular  assay. Negative results do not preclude SARS-CoV-2 infection  and should not be used as the sole basis for patient  management decisions. Invalid results may be due to inhibiting  substances in the specimen and recollection should occur.         Narrative:      Screening    CULTURE BLOOD AEROBIC AND ANAEROBIC [[id number]] Collected: 07/01/19 2340    Specimen: Blood, Venipuncture Updated: 07/02/19 0454    Narrative:      1 BLUE+1 PURPLE    CULTURE BLOOD AEROBIC AND ANAEROBIC [[id number]] Collected: 07/01/19 2340    Specimen: Blood, Venipuncture Updated: 07/02/19 0454    Narrative:      1 BLUE+1 PURPLE    Culture + Gram Stain,Aerobic, Wound [[id number]] Collected: 07/02/19 0009    Specimen: Wound from Discharge Updated: 07/02/19 0602    Narrative:      ORDER#: [id number]                                    ORDERED BY: Sheppard Coil  SOURCE: Discharge abdominal wound                    COLLECTED:  07/02/19 00:09  ANTIBIOTICS AT  COLL.:                                RECEIVED :  07/02/19 04:57  Stain, Gram                                FINAL       07/02/19 06:02  07/02/19   Moderate WBCs             Moderate Gram positive cocci             Moderate Gram negative rods             No Squamous epithelial cells seen  Culture and Gram Stain, Aerobic, Wound     PENDING             Wrist Right PA Lateral And Oblique    Result Date: 06/30/2019   4 views of the right wrist were compared to a similar study from 06/25/2019. No new fractures seen. No malalignment. Soft tissue swelling present. Atherosclerosis. Lorretta Harp, DO  06/30/2019 11:28 PM    CT Head WO Contrast    Result Date: 07/01/2019  Stable left posterior parietal soft tissue swelling with no acute intracranial process. Delaney Meigs, MD  07/01/2019 11:48 PM    CT Head without Contrast    Result Date: 06/30/2019   No acute intracranial findings demonstrated. No acute skull fracture. Posterior scalp hematoma. Lorretta Harp, DO  06/30/2019 11:43 PM    CT Cervical Spine without Contrast    Result Date: 06/30/2019  No evidence of acute cervical spine fracture or malalignment. Gillermina Phy, MD  06/30/2019 11:56 PM    XR Chest AP Portable    Result Date: 07/02/2019  No acute process. Delaney Meigs, MD  07/02/2019 12:36 AM    Tunneled Cath Check/Change (Permcath)    Result Date: 07/04/2019   Successful right internal jugular tunneled dialysis catheter exchange Nelma Rothman, MD  07/03/2019 2:21 PM        Echo Results     None          RADIOLOGY     Upon my review:    Signed,  Reni Hausner  2:24 PM 07/06/2019

## 2019-07-06 NOTE — Progress Notes (Signed)
[facility] PROGRESS NOTE  Aaron Edelman, x [phone] [facility] Spectralink)      Date Time: 07/06/19 12:54 PM  Patient Name: [name]  Attending Physician: Rebecka Apley, MD    CC: follow-up CKD    Assessment:     1. CKD- on HD at [facility]  2. Malfunctioning dialysis catheter  3. Volume overload- mild  4. HTN  5. Anemia- of CKD stable  6. Secondary HPT  7. 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 Recurrent falls      Recommendations:     1S/p new permcath   2 tolerated HD well with  Ultrafiltration  Yesterday   3dialysis tomorrow   4 continue antibiotics per !D  Case discussed with: pt,   Laural Golden, MD  [facility]  [phone] (office)  X [phone] ([place])      Subjective:feeling better no sob     Review of Systems:   Review of Systems - No CP, SOB, dizziness    Physical Exam:     Vitals:    07/06/19 0112 07/06/19 0328 07/06/19 0745 07/06/19 1119   BP: 132/75 123/66 108/65 119/58   Pulse: 66 66 62 66   Resp: 17 17 16 18    Temp: 99 F (37.2 C) 98.6 F (37 C) 98.4 F (36.9 C) 98.1 F (36.7 C)   TempSrc: Oral Oral Oral Oral   SpO2: 96% 98% 99% 96%   Weight:  104.3 kg (230 lb)     Height:           Intake and Output Summary (Last 24 hours) at Date Time    Intake/Output Summary (Last 24 hours) at 07/06/2019 1254  Last data filed at 07/06/2019 0900  Gross per 24 hour   Intake 880 ml   Output 100 ml   Net 780 ml     General: awake, alert, oriented x 3, no acute distress.  Cardiovascular: regular rate and rhythm  Lungs: clear to auscultation bilaterally, bilateral air entry, normal work of breathing  Abdomen: soft; LLQ wound bandaged  Extremities: trace edema  Other: RIJ Permcath    Meds:      Scheduled Meds: PRN Meds:    acetaminophen, 1,000 mg, Oral, TID  amLODIPine, 5 mg, Oral, Q12H  atorvastatin, 40 mg, Oral, Daily  carvedilol, 25 mg, Oral, Q12H  collagenase, , Topical, Q24H  gabapentin, 300 mg, Oral, Daily  heparin (porcine), 5,000 Units,  Subcutaneous, Q8H SCH  insulin glargine, 35 Units, Subcutaneous, QHS  insulin lispro, 5 Units, Subcutaneous, TID MEALS  levoFLOXacin, 500 mg, Oral, Q48H  pantoprazole, 40 mg, Oral, BID  sevelamer, 800 mg, Oral, TID MEALS  sodium hypochlorite, , Irrigation, Daily  vancomycin, , Intravenous, See Admin Instructions  vitamin C, 500 mg, Oral, Daily  zinc sulfate, 220 mg, Oral, Daily          Continuous Infusions:   dextrose, 15 g of glucose, PRN   And  dextrose, 12.5 g, PRN   And  glucagon (rDNA), 1 mg, PRN  HYDROmorphone, 0.4 mg, Q3H PRN  HYDROmorphone, 1 mg, Q3H PRN  melatonin, 3 mg, QHS PRN  naloxone, 0.2 mg, PRN  ondansetron, 4 mg, Q6H PRN   Or  ondansetron, 4 mg, Q6H PRN  oxyCODONE, 5 mg, Q6H PRN              Labs:     Recent Labs   Lab 07/06/19  0332 07/05/19  0514 07/04/19  0419   WBC  8.75 9.13 8.85   Hgb 7.3* 7.3* 7.9*   Hematocrit 24.8* 24.6* 26.6*   Platelets 122* 118* 118*     Recent Labs   Lab 07/06/19  0332 07/05/19  0514 07/04/19  0419   Sodium 138 138 137   Potassium 4.0 4.6 4.1   Chloride 102 100 100   CO2 25 24 27    BUN 27.0* 54.0* 37.0*   Creatinine 3.9* 5.6* 4.1*   Calcium 8.0* 8.1* 8.1*   Albumin 2.6* 2.2* 2.2*   Phosphorus 3.6 5.6* 4.9*   Magnesium 1.7 1.9 1.9   Glucose 227* 217* 169*   EGFR 14.0 9.2 13.2       Recent Labs   Lab 07/01/19  2241   Urine Type Urine, Clean Ca   Color, UA Yellow   Clarity, UA Hazy   Specific Gravity UA 1.015   Urine pH 6.0   Nitrite, UA Negative   Ketones UA Negative   Urobilinogen, UA Negative   Bilirubin, UA Negative   Blood, UA Negative   RBC, UA 3 - 5   WBC, UA 0 - 5                 Signed by: Laural Golden, MD

## 2019-07-06 NOTE — Plan of Care (Signed)
Wound care done as ordered, pain managed with prn Dilaudid and schedule Tylenol. Peri care done, patient has a bump and a little open wound on the groin area, patient voiced this has been there for a while and it is very painful, Z guard applied. BG went down to 64, orange juice given and rechecked, 111, insulin was held. MD is aware. Will  Ct to monitor.  Problem: Safety  Goal: Patient will be free from injury during hospitalization  Outcome: Progressing  Flowsheets (Taken 07/06/2019 1239)  Patient will be free from injury during hospitalization:   Assess patient's risk for falls and implement fall prevention plan of care per policy   Provide and maintain safe environment   Use appropriate transfer methods   Ensure appropriate safety devices are available at the bedside   Hourly rounding     Problem: Pain  Goal: Pain at adequate level as identified by patient  Outcome: Progressing  Flowsheets (Taken 07/05/2019 1658)  Pain at adequate level as identified by patient:   Identify patient comfort function goal   Assess for risk of opioid induced respiratory depression, including snoring/sleep apnea. Alert healthcare team of risk factors identified.   Assess pain on admission, during daily assessment and/or before any "as needed" intervention(s)   Reassess pain within 30-60 minutes of any procedure/intervention, per Pain Assessment, Intervention, Reassessment (AIR) Cycle   Evaluate if patient comfort function goal is met   Evaluate patient's satisfaction with pain management progress   Offer non-pharmacological pain management interventions     Problem: Diabetes: Glucose Imbalance  Goal: Blood glucose stable at established goal  Outcome: Progressing  Flowsheets (Taken 07/06/2019 1239)  Blood glucose stable at established goal:   Monitor lab values   Monitor intake and output.  Notify LIP if urine output is < 30 mL/hour.   Include patient/family in decisions related to nutrition/dietary selections   Follow  fluid restrictions/IV/PO parameters   Assess for hypoglycemia /hyperglycemia   Monitor/assess vital signs   Coordinate medication administration with meals, as indicated   Ensure appropriate diet and assess tolerance   Ensure adequate hydration   Ensure appropriate consults are obtained (Nutrition, Diabetes Education, and Case Management)   Ensure patient/family has adequate teaching materials     Problem: Infection  Goal: Free from infection  Outcome: Progressing  Flowsheets (Taken 07/06/2019 1239)  Free from infection:   Assess for signs/symptoms of infection   Utilize isolation precautions per protocol/policy   Consult/collaborate with Infection Preventionist   Assess immunization status   Utilize sepsis protocol

## 2019-07-07 LAB — RENAL FUNCTION PANEL
Albumin: 2.4 g/dL — ABNORMAL LOW (ref 3.5–5.0)
Anion Gap: 12 (ref 5.0–15.0)
BUN: 42 mg/dL — ABNORMAL HIGH (ref 7.0–19.0)
CO2: 24 mEq/L (ref 22–29)
Calcium: 8 mg/dL — ABNORMAL LOW (ref 8.5–10.5)
Chloride: 102 mEq/L (ref 100–111)
Creatinine: 5.2 mg/dL — ABNORMAL HIGH (ref 0.6–1.0)
Glucose: 180 mg/dL — ABNORMAL HIGH (ref 70–100)
Phosphorus: 4.1 mg/dL (ref 2.3–4.7)
Potassium: 4.5 mEq/L (ref 3.5–5.1)
Sodium: 138 mEq/L (ref 136–145)

## 2019-07-07 LAB — GFR: EGFR: 10.1

## 2019-07-07 LAB — CBC
Absolute NRBC: 0 10*3/uL (ref 0.00–0.00)
Hematocrit: 24.4 % — ABNORMAL LOW (ref 34.7–43.7)
Hgb: 7.1 g/dL — ABNORMAL LOW (ref 11.4–14.8)
MCH: 27.5 pg (ref 25.1–33.5)
MCHC: 29.1 g/dL — ABNORMAL LOW (ref 31.5–35.8)
MCV: 94.6 fL (ref 78.0–96.0)
MPV: 12.1 fL (ref 8.9–12.5)
Nucleated RBC: 0 /100 WBC (ref 0.0–0.0)
Platelets: 125 10*3/uL — ABNORMAL LOW (ref 142–346)
RBC: 2.58 10*6/uL — ABNORMAL LOW (ref 3.90–5.10)
RDW: 13 % (ref 11–15)
WBC: 9.07 10*3/uL (ref 3.10–9.50)

## 2019-07-07 LAB — MAGNESIUM: Magnesium: 1.8 mg/dL (ref 1.6–2.6)

## 2019-07-07 LAB — VANCOMYCIN, RANDOM: Vancomycin Random: 29.5 ug/mL

## 2019-07-07 LAB — GLUCOSE WHOLE BLOOD - POCT
Whole Blood Glucose POCT: 191 mg/dL — ABNORMAL HIGH (ref 70–100)
Whole Blood Glucose POCT: 156 mg/dL — ABNORMAL HIGH (ref 70–100)
Whole Blood Glucose POCT: 131 mg/dL — ABNORMAL HIGH (ref 70–100)
Whole Blood Glucose POCT: 91 mg/dL (ref 70–100)

## 2019-07-07 LAB — HEMOLYSIS INDEX: Hemolysis Index: -2 — ABNORMAL LOW (ref 0–18)

## 2019-07-07 MED ORDER — FLUCONAZOLE 100 MG PO TABS
50.00 mg | ORAL_TABLET | ORAL | Status: AC
Start: 2019-07-07 — End: 2019-07-09
  Administered 2019-07-07 – 2019-07-09 (×3): 50 mg via ORAL
  Filled 2019-07-07 (×3): qty 1

## 2019-07-07 MED ORDER — NYSTATIN 100000 UNIT/GM EX POWD
1.00 | Freq: Two times a day (BID) | CUTANEOUS | Status: DC
Start: 2019-07-07 — End: 2019-07-18
  Administered 2019-07-07 – 2019-07-18 (×22): 1 via TOPICAL
  Filled 2019-07-07 (×2): qty 15

## 2019-07-07 MED ORDER — INSULIN GLARGINE 100 UNIT/ML SC SOLN
30.00 [IU] | Freq: Every morning | SUBCUTANEOUS | Status: DC
Start: 2019-07-08 — End: 2019-07-18
  Administered 2019-07-08 – 2019-07-18 (×11): 30 [IU] via SUBCUTANEOUS
  Filled 2019-07-07 (×11): qty 30

## 2019-07-07 MED ORDER — INSULIN LISPRO 100 UNIT/ML SC SOLN
1.00 [IU] | Freq: Every evening | SUBCUTANEOUS | Status: DC
Start: 2019-07-07 — End: 2019-07-18
  Administered 2019-07-08: 22:00:00 2 [IU] via SUBCUTANEOUS
  Administered 2019-07-09: 23:00:00 1 [IU] via SUBCUTANEOUS
  Administered 2019-07-10 – 2019-07-11 (×2): 2 [IU] via SUBCUTANEOUS
  Administered 2019-07-13: 22:00:00 3 [IU] via SUBCUTANEOUS
  Administered 2019-07-14: 23:00:00 2 [IU] via SUBCUTANEOUS
  Administered 2019-07-15: 22:00:00 1 [IU] via SUBCUTANEOUS
  Administered 2019-07-16: 22:00:00 3 [IU] via SUBCUTANEOUS
  Filled 2019-07-07: qty 3
  Filled 2019-07-07: qty 6
  Filled 2019-07-07 (×2): qty 9
  Filled 2019-07-07: qty 6
  Filled 2019-07-07 (×3): qty 3

## 2019-07-07 MED ORDER — INSULIN LISPRO 100 UNIT/ML SC SOLN
1.00 [IU] | Freq: Three times a day (TID) | SUBCUTANEOUS | Status: DC
Start: 2019-07-08 — End: 2019-07-18
  Administered 2019-07-09: 18:00:00 3 [IU] via SUBCUTANEOUS
  Administered 2019-07-09: 14:00:00 2 [IU] via SUBCUTANEOUS
  Administered 2019-07-09: 09:00:00 1 [IU] via SUBCUTANEOUS
  Administered 2019-07-10 – 2019-07-11 (×3): 2 [IU] via SUBCUTANEOUS
  Administered 2019-07-11: 10:00:00 1 [IU] via SUBCUTANEOUS
  Administered 2019-07-11: 18:00:00 3 [IU] via SUBCUTANEOUS
  Administered 2019-07-12: 19:00:00 4 [IU] via SUBCUTANEOUS
  Administered 2019-07-12: 09:00:00 2 [IU] via SUBCUTANEOUS
  Administered 2019-07-12: 13:00:00 4 [IU] via SUBCUTANEOUS
  Administered 2019-07-13 (×2): 2 [IU] via SUBCUTANEOUS
  Administered 2019-07-13 – 2019-07-14 (×2): 1 [IU] via SUBCUTANEOUS
  Administered 2019-07-14: 13:00:00 2 [IU] via SUBCUTANEOUS
  Administered 2019-07-14: 09:00:00 3 [IU] via SUBCUTANEOUS
  Administered 2019-07-15 – 2019-07-16 (×3): 2 [IU] via SUBCUTANEOUS
  Administered 2019-07-16: 17:00:00 3 [IU] via SUBCUTANEOUS
  Administered 2019-07-16 – 2019-07-18 (×4): 2 [IU] via SUBCUTANEOUS
  Filled 2019-07-07: qty 6
  Filled 2019-07-07: qty 9
  Filled 2019-07-07: qty 6
  Filled 2019-07-07: qty 12
  Filled 2019-07-07 (×2): qty 6
  Filled 2019-07-07: qty 3
  Filled 2019-07-07: qty 9
  Filled 2019-07-07: qty 6
  Filled 2019-07-07: qty 9
  Filled 2019-07-07: qty 3
  Filled 2019-07-07 (×4): qty 6
  Filled 2019-07-07: qty 12
  Filled 2019-07-07: qty 9
  Filled 2019-07-07: qty 3
  Filled 2019-07-07 (×4): qty 6
  Filled 2019-07-07 (×2): qty 3
  Filled 2019-07-07 (×2): qty 6

## 2019-07-07 MED ORDER — DARBEPOETIN ALFA 60 MCG/0.3ML IJ SOSY
0.45 ug/kg | PREFILLED_SYRINGE | INTRAMUSCULAR | Status: DC
Start: 2019-07-07 — End: 2019-07-18
  Administered 2019-07-07 – 2019-07-14 (×2): 60 ug via SUBCUTANEOUS
  Filled 2019-07-07 (×2): qty 0.3

## 2019-07-07 NOTE — Plan of Care (Signed)
Discharge Recommendation: SNF  DME Recommended for Discharge: Transport chair, [place], [facility], Hospital bed    Is an Occupational Therapy Evaluation Indicated at this time? This patient is already on OT caseload.     Treatment/Interventions: Exercise, Gait training, Neuromuscular re-education, Functional transfer training, LE strengthening/ROM, Endurance training, Patient/family training, Equipment eval/education, Bed mobility, Compensatory technique education  PT Frequency: 2-3x/wk     PMP Activity: Step 5 - Chair  Distance Walked (ft) (Step 6,7): 2 Feet  (Please See Therapy Evaluation for device and assistance level needed)    Goals:   Goals  Goal Formulation: With patient  Time for Goal Acheivement: By time of discharge  Goals: Select goal  Pt Will Go Supine To Sit: with stand by assist, to maximize functional mobility and independence  Pt Will Perform Sit To Supine: with stand by assist, to maximize functional mobility and independence  Pt Will Achieve Sitting Balance: 4/5 moves/returns trunkal midpoint 1-2 inches in multiple planes, to maximize functional mobility and independence  Pt Will Perform Sit to Stand: with minimal assist, to maximize functional mobility and independence  Pt Will Transfer Bed/Chair: with rolling walker, with moderate assist, to maximize functional mobility and independence

## 2019-07-07 NOTE — Progress Notes (Signed)
[facility] PROGRESS NOTE  Aaron Edelman, x [phone] [facility] Spectralink)      Date Time: 07/07/19 1:13 PM  Patient Name: [name]  Attending Physician: Rebecka Apley, MD    CC: follow-up CKD    Assessment:     1. ESRD- on HD at [facility]  2. Volume overload- mild  3. HTN  4. Anemia- of CKD stable  5. Secondary HPT  6. Abdominal surgical wound- not healing, MRSA and Pseudomonas  7. Recurrent falls      Recommendations:   1. HD TTS  2. Stable from renal standpoint       Case discussed with: pt    Corky Mull, MD  [facility]  [phone] (office)  X [phone] [facility] Spectra-Link)      Subjective: no sob  Reports neck pain    Review of Systems:   Review of Systems - No CP, SOB, dizziness    Physical Exam:     Vitals:    07/07/19 0428 07/07/19 0747 07/07/19 1022 07/07/19 1116   BP: 106/66 116/72 116/72 113/67   Pulse: 69 68 72 73   Resp: 16 16  16    Temp: 98.2 F (36.8 C) 98.4 F (36.9 C)  98.4 F (36.9 C)   TempSrc: Oral Oral  Oral   SpO2: 97% 98%  98%   Weight: 112.7 kg (248 lb 7.3 oz)      Height:           Intake and Output Summary (Last 24 hours) at Date Time    Intake/Output Summary (Last 24 hours) at 07/07/2019 1313  Last data filed at 07/07/2019 0500  Gross per 24 hour   Intake 610 ml   Output 250 ml   Net 360 ml     General: awake, alert, oriented x 3, no acute distress.  Cardiovascular: regular rate and rhythm  Lungs: clear to auscultation bilaterally, bilateral air entry, normal work of breathing  Abdomen: soft; LLQ wound bandaged  Extremities: trace edema  Other: RIJ Permcath    Meds:      Scheduled Meds: PRN Meds:    acetaminophen, 1,000 mg, Oral, TID  amLODIPine, 5 mg, Oral, Q12H  atorvastatin, 40 mg, Oral, Daily  carvedilol, 25 mg, Oral, Q12H  collagenase, , Topical, Q24H  fluconazole, 50 mg, Oral, Q24H SCH  gabapentin, 300 mg, Oral, Daily  heparin (porcine), 5,000 Units, Subcutaneous, Q8H SCH  insulin glargine, 30 Units, Subcutaneous, QHS  insulin lispro, 5 Units,  Subcutaneous, TID MEALS  levoFLOXacin, 500 mg, Oral, Q48H  nystatin, 1 application, Topical, BID  pantoprazole, 40 mg, Oral, BID  sevelamer, 800 mg, Oral, TID MEALS  sodium hypochlorite, , Irrigation, Daily  vancomycin, , Intravenous, See Admin Instructions  vitamin C, 500 mg, Oral, Daily  zinc sulfate, 220 mg, Oral, Daily          Continuous Infusions:   dextrose, 15 g of glucose, PRN   And  dextrose, 12.5 g, PRN   And  glucagon (rDNA), 1 mg, PRN  melatonin, 3 mg, QHS PRN  naloxone, 0.2 mg, PRN  ondansetron, 4 mg, Q6H PRN   Or  ondansetron, 4 mg, Q6H PRN  oxyCODONE, 5 mg, Q6H PRN              Labs:     Recent Labs   Lab 07/07/19  0426 07/06/19  0332 07/05/19  0514   WBC 9.07 8.75 9.13   Hgb 7.1* 7.3* 7.3*   Hematocrit 24.4* 24.8* 24.6*  Platelets 125* 122* 118*     Recent Labs   Lab 07/07/19  0426 07/06/19  0332 07/05/19  0514   Sodium 138 138 138   Potassium 4.5 4.0 4.6   Chloride 102 102 100   CO2 24 25 24    BUN 42.0* 27.0* 54.0*   Creatinine 5.2* 3.9* 5.6*   Calcium 8.0* 8.0* 8.1*   Albumin 2.4* 2.6* 2.2*   Phosphorus 4.1 3.6 5.6*   Magnesium 1.8 1.7 1.9   Glucose 180* 227* 217*   EGFR 10.1 14.0 9.2       Recent Labs   Lab 07/01/19  2241   Urine Type Urine, Clean Ca   Color, UA Yellow   Clarity, UA Hazy   Specific Gravity UA 1.015   Urine pH 6.0   Nitrite, UA Negative   Ketones UA Negative   Urobilinogen, UA Negative   Bilirubin, UA Negative   Blood, UA Negative   RBC, UA 3 - 5   WBC, UA 0 - 5                 Signed by: Corky Mull, MD

## 2019-07-07 NOTE — UM Notes (Signed)
[facility] Utilization Review   NPI #[id number], Tax ID [id number]  Please call Milas Gain MSN RN CCM @ [phone] with any questions or concerns.  Email:  [email or web address]  Fax final authorization and requests for additional information to [phone]    CONCURRENT REVIEW FOR: 07/07/19    LOC: ACUTE    PATIENT NAME: [name],[name] / [birth year] / AGE: 64 y.o.      S/I: PAIN CONTROL; IV NARCOTIC; ANTIBIOTIC ADJUSTED      V/S: Vital Sign Min/Max (last 24 hours)    Value Min Max   Temp 98.2 F (36.8 C) 98.6 F (37 C)   Heart Rate 65 73   Resp Rate 16 18   BP: Systolic 528 413   BP: Diastolic 60 74   SpO2 97 % 99 %          Labs -  H/H 24.4/7.1; PLT 125; RBC 2.58; GLUCOSE 180; BUN 42; CR 5.2; CALCIUM 8; ALBUMIN 2.4        MD NOTES:  ID NOTES  Assessment and Plan:   1.  Abdominal wall abscess: MRSA and Pseudomonas  2.  Yeast infection  Plan: 1.  Vancomycin for MRSA, Levaquin for Pseudomonas  2.  Add nystatin and fluconazole for.  Inguinal yeast infection      MEDICINE NOTES  SUBJECTIVE:  6/11-concerned about her abdominal wound.  Discussed culture results.  Pain minimal.     6/12 - Patient complains of some pain in abdominal wound. No complaints of fever, chills, sob.  Tolerating diet.    6/13 - Patient complains of abdominal pain at wound site. She has no complaints of fever, chills  She had a BM this AM. She had OJ when BG was 64    6/14 -patient reports feeling okay she is frustrated about the abdominal wound smell.  She states that she will not go to skilled nursing facility as recommended by therapy  Explained that we will get a reevaluation to see how she is doing  She complains of intermittent abdominal pain.  No complaints of nausea, vomiting.  She is tolerating diet  Interval Summary:   Initially presented with mechanical fall.  Then had issue with her tunneled dialysis catheter and had that exchanged on 6/10.  On night of 6/8 patient had febrile episode with confusion.  Had  wound culture sent for him her chronic abdominal wound.  Grew MRSA and Pseudomonas.  Consulted ID and started on IV vancomycin along with oral Levaquin.  Plastic surgery was consulted and recommended continued wound care with [facility] solutions.  Plastic surgery to follow peripherally.  Continued IV antibiotics.  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 Hgb in 7 range today. Iron profile suggestive of AOCD  Transient alteration in mental status - mental status back at baseline  Essential hypertension  Hyperlipidemia  Neuropathy, NOS  DM2  Inguinal yeast infection       Hemodialysis - today 07/07/19 and patient to resume HD at [facility] after discharge  ID consulted: Appreciate their recommendations  Continue  IV vancomycin and oral Levaquin, discussed with Dr. Lahoma Crocker  Started on fluconazole and nystatin for inguinal yeast infection  Plastic surgery consultation: Appreciate recommendations. Continue dakins dressing changes BID, allow the wound to granulate and it is not ready for closure. Follow up wound clinic    PT/OT evaluation ono 6/8 recommending SNF;  patient is reluctant; Asked PT to re-evulate  Continue pain control  -Acetaminophen 1000 mg 3 times daily  -Oxycodone 5 mg every 6 hours as needed moderate pain  -D/C Dilaudid 0.4 mg every 3 hours as needed severe pain  Continue bowel regimen with Colace and MiraLAX  Continue Norvasc  Continue Lipitor  Continue carvedilol  Reduce lantus to 30 units due episode of hypoglycemia on 6/13  SSI and adjust accordingly   Continue Protonix  Monitor H&H    Analgesia: Acetaminophen, oxycodone    Nutrition: Renal    DVT Prophylaxis: Heparin  DISPO: SNF once medically stable      Current Medications:   Scheduled Meds:  Current Facility-Administered Medications   Medication Dose Route Frequency    acetaminophen  1,000 mg Oral TID     amLODIPine  5 mg Oral Q12H    atorvastatin  40 mg Oral Daily    carvedilol  25 mg Oral Q12H    collagenase   Topical Q24H    darbepoetin alfa  0.45 mcg/kg Subcutaneous Weekly    fluconazole  50 mg Oral Q24H SCH    gabapentin  300 mg Oral Daily    heparin (porcine)  5,000 Units Subcutaneous Q8H [place]    insulin glargine  30 Units Subcutaneous QHS    insulin lispro  5 Units Subcutaneous TID MEALS    levoFLOXacin  500 mg Oral Q48H    nystatin  1 application Topical BID    pantoprazole  40 mg Oral BID    sevelamer  800 mg Oral TID MEALS    sodium hypochlorite   Irrigation Daily    vancomycin   Intravenous See Admin Instructions    vitamin C  500 mg Oral Daily    zinc sulfate  220 mg Oral Daily     Continuous Infusions:  PRN Meds:  DILAUDID 0.4 MG IV--X2     DCP: SNF VS HH

## 2019-07-07 NOTE — PT Progress Note (Signed)
Physical Therapy Note    [facility]  Physical Therapy Treatment    Patient:  NANDIKA STETZER  MRN#:  [id number]  Unit:  [address] [place]  Room/Bed:  A2314/A2314.01    Time of treatment:  Time Calculation  PT Received On: 07/07/19  Start Time: 1128  Stop Time: 1155  Time Calculation (min): 27 min          Chart Review and Collaboration with Care Team: 6 minutes, not included in above time.    PT Visit Number: 2    Precautions:   Precautions  Weight Bearing Status:  (recent R wrist fx, utilized R wrist/hand NWB)  Other Precautions: contact MRSA    Personal Protective Equipment (PPE)  gloves, procedure gown and procedure mask with face shield    Updated X-Rays/Tests/Labs:  Lab Results   Component Value Date/Time    HGB 7.1 (L) 07/07/2019 04:26 AM    HCT 24.4 (L) 07/07/2019 04:26 AM    K 4.5 07/07/2019 04:26 AM    NA 138 07/07/2019 04:26 AM    INR 1.1 07/01/2019 10:41 PM    TROPI 0.02 06/23/2019 12:29 PM    TROPI 0.04 05/17/2019 06:20 PM    TROPI 0.05 05/17/2019 01:37 PM       All imaging reviewed, please see chart for details.      Subjective: "It burns, they got to give me that powder."       Pain Assessment  Pain Assessment: Numeric Scale (0-10)  Pain Score: 10-severe pain  POSS Score: Awake and Alert  Pain Location: Groin  Pain Orientation: Right  Pain Descriptors: Burning  Pain Frequency: Increases with movement  Pain Intervention(s): Repositioned           Patient's medical condition is appropriate for Physical Therapy intervention at this time.  Patient is agreeable to participation in the therapy session. Nursing clears patient for therapy.      Objective:    PTA has reviewed chart and PT Eval for POC prior to treatment.      Observation of Patient/Vital Signs: Blood pressure 113/67, pulse 73, temperature 98.4 F (36.9 C), temperature source Oral, resp. rate 16, height 1.575 m (5\' 2" ), weight 112.7 kg (248 lb 7.3 oz), SpO2 98 %.        Cognition/Neuro Status  Arousal/Alertness:  Appropriate responses to stimuli  Attention Span: Appears intact  Following Commands: Follows all commands and directions without difficulty  Safety Awareness: minimal verbal instruction  Insights: Educated in safety awareness  Behavior: calm;cooperative         Functional Mobility  Rolling: Contact Guard Assist;to Right;to Left  Supine to Sit: Minimal Assist;Increased Time;Increased Effort;to Left  Scooting to EOB: Contact Guard Assist;Increased Effort;Increased Time;to Left  Sit to Stand: Minimal Assist;Increased Time;Increased Effort;with instruction for hand placement to increase safety;bed elevated  Stand to Sit: Contact Guard Assist  Transfers  Bed to Chair: Minimal Assist;to Left  Device Used for Functional Transfer:  (HHA LUE )     Distance Walked (ft) (Step 6,7): 2 Feet    Therapeutic Exercise  Straight Leg Raise: x10 L/R   Supine glute/ quad sets: x10   Heelslides: x10 L/R   Seated heel/toe raise: x20 B     Neuro Re-Ed  Sitting Balance: with instruction;with support;stand by assist (EOB)  Standing Balance: with instruction;with support;minimal assist (HHA)        Educated the patient to role of physical therapy, plan of care, goals of therapy and  HEP, safety with mobility and ADLs with verbalized understanding.    Patient left in bedside chair with all medical equipment in place and call bell and all personal items/needs within reach.  RN notified of session outcome.      Assessment:    Pt motivated toward progression, ROM limited in part to body habitus, limited OOB activity secondary R side inner groin pain appears to have chaffing/ rubbing. Progressed from [facility] HHA w/LUE, pt having frequent tendency reaching grabbing/pulling w/R hand despite instructions; observed limited pain/ discomfort R hand, R wrist/hand splint worn t/o. Patient would continue to benefit from skilled physical therapy to address deficits and increase functional independence.          PMP Activity: Step 5 - Chair  Distance  Walked (ft) (Step 6,7): 2 Feet    Plan:  Treatment/Interventions: Exercise, Gait training, Neuromuscular re-education, Functional transfer training, LE strengthening/ROM, Endurance training, Patient/family training, Equipment eval/education, Bed mobility, Compensatory technique education      PT Frequency: 2-3x/wk   Continue plan of care.    Goals:  Goals  Goal Formulation: With patient  Time for Goal Acheivement: By time of discharge  Goals: Select goal  Pt Will Go Supine To Sit: with stand by assist, to maximize functional mobility and independence  Pt Will Perform Sit To Supine: with stand by assist, to maximize functional mobility and independence  Pt Will Achieve Sitting Balance: 4/5 moves/returns trunkal midpoint 1-2 inches in multiple planes, to maximize functional mobility and independence  Pt Will Perform Sit to Stand: with minimal assist, to maximize functional mobility and independence  Pt Will Transfer Bed/Chair: with rolling walker, with moderate assist, to maximize functional mobility and independence        DME Recommended for Discharge: Transport chair, Stormy Fabian, [place] [facility] bed  Discharge Recommendation: SNF    Adah Salvage, [place] Lic# [id number]    Physical Medicine and [facility]  [phone]      07/07/2019 12:10 PM

## 2019-07-07 NOTE — Plan of Care (Signed)
Problem: Safety  Goal: Patient will be free from injury during hospitalization  Outcome: Progressing  Flowsheets (Taken 07/06/2019 1239 by Venia Minks, RN)  Patient will be free from injury during hospitalization:   Assess patient's risk for falls and implement fall prevention plan of care per policy   Provide and maintain safe environment   Use appropriate transfer methods   Ensure appropriate safety devices are available at the bedside   Hourly rounding     Problem: Pain  Goal: Pain at adequate level as identified by patient  Outcome: Progressing  Flowsheets (Taken 07/05/2019 1658 by Venia Minks, RN)  Pain at adequate level as identified by patient:   Identify patient comfort function goal   Assess for risk of opioid induced respiratory depression, including snoring/sleep apnea. Alert healthcare team of risk factors identified.   Assess pain on admission, during daily assessment and/or before any "as needed" intervention(s)   Reassess pain within 30-60 minutes of any procedure/intervention, per Pain Assessment, Intervention, Reassessment (AIR) Cycle   Evaluate if patient comfort function goal is met   Evaluate patient's satisfaction with pain management progress   Offer non-pharmacological pain management interventions     Problem: Side Effects from Pain Analgesia  Goal: Patient will experience minimal side effects of analgesic therapy  Outcome: Progressing  Flowsheets (Taken 07/07/2019 1955)  Patient will experience minimal side effects of analgesic therapy:   Monitor/assess patient's respiratory status (RR depth, effort, breath sounds)   Prevent/manage side effects per LIP orders (i.e. nausea, vomiting, pruritus, constipation, urinary retention, etc.)   Evaluate for opioid-induced sedation with appropriate assessment tool (i.e. POSS)     Problem: Compromised Tissue integrity  Goal: Damaged tissue is healing and protected  Outcome: Progressing  Flowsheets (Taken 07/05/2019 0203 by [place] Pink, RN)  Damaged  tissue is healing and protected:   Monitor/assess Braden scale every shift   Reposition patient every 2 hours and as needed unless able to reposition self   Increase activity as tolerated/progressive mobility   Relieve pressure to bony prominences for patients at moderate and high risk   Keep intact skin clean and dry   Use incontinence wipes for cleaning urine, stool and caustic drainage. Foley care as needed   Monitor external devices/tubes for correct placement to prevent pressure, friction and shearing   Encourage use of lotion/moisturizer on skin   Consult/collaborate with wound care nurse   Consider placing an indwelling catheter if incontinence interferes with healing of stage 3 or 4 pressure injury   Utilize specialty bed   Monitor patient's hygiene practices   Use bath wipes, not soap and water, for daily bathing   Avoid shearing injuries   Provide wound care per wound care algorithm     Problem: Renal Instability  Goal: Fluid and electrolyte balance are achieved/maintained  Outcome: Progressing  Flowsheets (Taken 07/06/2019 0437 by Candice Camp, RN)  Fluid and electrolyte balance are achieved/maintained:   Monitor intake and output every shift   Provide adequate hydration   Assess and reassess fluid and electrolyte status   Observe for seizure activity and initiate seizure precautions if indicated   Monitor for muscle weakness   Monitor daily weight   Monitor/assess lab values and report abnormal values   Assess for confusion/personality changes   Observe for cardiac arrhythmias   Follow fluid restrictions/IV/PO parameters  Goal: Free from infection  Outcome: Progressing  Flowsheets (Taken 07/05/2019 1658 by Venia Minks, RN)  Free from infection:   Monitor/assess for signs and symptoms of  infection   Assess need for indwelling catheter every shift and discuss with LIP

## 2019-07-07 NOTE — Plan of Care (Addendum)
Patient alert and oriented X4, able to verbalized needs and call as needed. Patient on room air and no telemetry. Patient assisted to the bathroom with walker, Bed linen and gown changed. Abd wound dressing clean, dry and intact. Fall and safety precautions implemented.      Problem: Safety  Goal: Patient will be free from injury during hospitalization  Outcome: Progressing  Goal: Patient will be free from infection during hospitalization  Outcome: Progressing     Problem: Pain  Goal: Pain at adequate level as identified by patient  Outcome: Progressing     Problem: Nutrition  Goal: Nutritional intake is adequate  Outcome: Progressing     Problem: Infection  Goal: Free from infection  Outcome: Progressing     Problem: Inadequate Cardiac Output  Goal: Adequate tissue perfusion will be maintained  Outcome: Progressing     Problem: Diabetes: Glucose Imbalance  Goal: Blood glucose stable at established goal  Outcome: Progressing     Problem: Patient Receiving Advanced Renal Therapies  Goal: Therapy access site remains intact  Outcome: Progressing     Problem: Discharge Barriers  Goal: Patient will be discharged home or other facility with appropriate resources  Outcome: Progressing

## 2019-07-07 NOTE — Progress Notes (Signed)
Progress Note- Pharmacy Vancomycin Dosing Consult:  Day 4  Vancomycin Indication: Wound infection   Vancomycin Target Trough Level: Pre-HD level for 15-25 mcg/mL    Pertinent Cultures:    Date Source  Organism & Pertinent Susceptibilities     6/8 Blood cx x2 NG x 5 days    6/9 Wound cx Heavy growth of Pseudomonas aeruginosa   Heavy growth of MRSA   6/13 Wound cx (repeated) Few WBCs   No organisms seen   Final results pending         As appropriate, contact physician to consider change in therapy if cultures grow organism other than MRSA     Serum creatinine: 5.2 mg/dL (H) 07/07/19 0426  Estimated creatinine clearance: 13 mL/min (A)    Baseline SCr: ESRD on HD QOD? - unsure of the patient's schedule   Nephrotoxic drugs administered in the past 48 hours: None    Assessment  Measured Vancomycin Level: Pre-HD random level = 29.5 (supra-therapeutic).   Recent Labs   Lab 07/07/19  0426 07/05/19  0514   Vancomycin Random 29.5 16.0         Recommendations:  Due to supra-therapeutic pre-HD random level of 29.5 mcg/mL, will hold vancomycin for today.   New Vancomycin level: Pre-HD random level ordered for Wednesday, 6/16 with AM labs.     Thank you,  Johann Capers

## 2019-07-07 NOTE — Progress Notes (Signed)
[facility]  PROGRESS NOTE      Patient: [name]  Date: 07/07/2019   LOS: 5 Days  Admission Date: 06/30/2019   MRN: [id number]  Attending: Rebecka Apley  Please contact me on Epic Secure Chat       ASSESSMENT/PLAN     SKYRAH KRUPP is a 64 y.o. female admitted with Dizziness    Interval Summary:     Initially presented with mechanical fall.  Then had issue with her tunneled dialysis catheter and had that exchanged on 6/10.  On night of 6/8 patient had febrile episode with confusion.  Had wound culture sent for him her chronic abdominal wound.  Grew MRSA and Pseudomonas.  Consulted ID and started on IV vancomycin along with oral Levaquin.  Plastic surgery was consulted and recommended continued wound care with [facility] solutions.  Plastic surgery to follow peripherally.  Continued IV antibiotics.    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 Hgb in 7 range today. Iron profile suggestive of AOCD  Transient alteration in mental status - mental status back at baseline  Essential hypertension  Hyperlipidemia  Neuropathy, NOS  DM2  Inguinal yeast infection       Hemodialysis - today 07/07/19 and patient to resume HD at [facility] after discharge  ID consulted: Appreciate their recommendations  Continue  IV vancomycin and oral Levaquin, discussed with Dr. Lahoma Crocker  Started on fluconazole and nystatin for inguinal yeast infection  Plastic surgery consultation: Appreciate recommendations. Continue dakins dressing changes BID, allow the wound to  granulate and it is not ready for closure. Follow up wound clinic    PT/OT evaluation ono 6/8 recommending SNF; patient is reluctant; Asked PT to re-evulate  Continue pain control  -Acetaminophen 1000 mg 3 times daily  -Oxycodone 5 mg every 6 hours as needed moderate pain  -D/C Dilaudid 0.4 mg every 3 hours as needed severe pain  Continue bowel regimen with Colace and MiraLAX  Continue Norvasc  Continue Lipitor  Continue carvedilol  Reduce lantus to 30 units due episode of hypoglycemia on 6/13  SSI and adjust accordingly   Continue Protonix  Monitor H&H      Analgesia: Acetaminophen, oxycodone    Nutrition: Renal    DVT Prophylaxis: Heparin       Code Status: DNR    DISPO: SNF once medically stable    Family Contact: Daughter       [place] states in pain, back of head, arm.  Wanting pain meds.    6/10-  Drowsy, no acute issues    6/11-concerned about her abdominal wound.  Discussed culture results.  Pain minimal.  No other issues.    6/12 - Patient complains of some pain in abdominal wound. No complaints of fever, chills, sob.  Tolerating diet.    6/13 - Patient complains of abdominal pain at wound site. She has no complaints of fever, chills  She had a BM this AM. She had OJ when BG was 64    6/14 -patient reports feeling okay she is frustrated about the abdominal wound smell.  She states that she will not go to skilled nursing facility as recommended by therapy  Explained that we will get a reevaluation to see how she is doing  She complains of intermittent abdominal pain.  No complaints of nausea, vomiting.  She is tolerating diet  MEDICATIONS     Current Facility-Administered Medications   Medication Dose Route Frequency    acetaminophen  1,000 mg Oral TID    amLODIPine  5 mg Oral Q12H    atorvastatin  40 mg Oral Daily    carvedilol  25 mg Oral Q12H    collagenase   Topical Q24H    fluconazole  50 mg Oral Q24H SCH    gabapentin  300 mg Oral Daily    heparin (porcine)  5,000  Units Subcutaneous Q8H [place]    insulin glargine  30 Units Subcutaneous QHS    insulin lispro  5 Units Subcutaneous TID MEALS    levoFLOXacin  500 mg Oral Q48H    nystatin  1 application Topical BID    pantoprazole  40 mg Oral BID    sevelamer  800 mg Oral TID MEALS    sodium hypochlorite   Irrigation Daily    vancomycin   Intravenous See Admin Instructions    vitamin C  500 mg Oral Daily    zinc sulfate  220 mg Oral Daily       PHYSICAL EXAM     Vitals:    07/07/19 1116   BP: 113/67   Pulse: 73   Resp: 16   Temp: 98.4 F (36.9 C)   SpO2: 98%       Temperature: Temp  Min: 98.2 F (36.8 C)  Max: 98.6 F (37 C)  Pulse: Pulse  Min: 65  Max: 73  Respiratory: Resp  Min: 16  Max: 18  Non-Invasive BP: BP  Min: 106/66  Max: 146/74  Pulse Oximetry SpO2  Min: 97 %  Max: 99 %    Intake and Output Summary (Last 24 hours) at Date Time    Intake/Output Summary (Last 24 hours) at 07/07/2019 1219  Last data filed at 07/07/2019 0500  Gross per 24 hour   Intake 610 ml   Output 250 ml   Net 360 ml         GEN APPEARANCE: Normal;  A&OX3  HEENT: PERLA; EOMI; Conjunctiva Clear  NECK: Supple; No bruits  CVS: RRR, S1, S2; No M/G/R  LUNGS: CTAB; No Wheezes; No Rhonchi: No rales  ABD: Left upper quadrant abdominal wound bandage with no drainage  EXT: No edema; Pulses 2+ and intact  Skin exam:  pink. Inguinal yeast infection   NEURO: CN 2-12 intact; No Focal neurological deficits  CAP REFILL:  Normal  MENTAL STATUS:  Normal    LABS     Recent Labs   Lab 07/07/19  0426 07/06/19  0332 07/05/19  0514   WBC 9.07 8.75 9.13   RBC 2.58* 2.64* 2.62*   Hgb 7.1* 7.3* 7.3*   Hematocrit 24.4* 24.8* 24.6*   MCV 94.6 93.9 93.9   Platelets 125* 122* 118*       Recent Labs   Lab 07/07/19  0426 07/06/19  0332 07/05/19  0514 07/04/19  0419 07/03/19  0604   Sodium 138 138 138 137 135*   Potassium 4.5 4.0  4.6 4.1 6.0*   Chloride 102 102 100 100 103   CO2 24 25 24 27  19*   BUN 42.0* 27.0* 54.0* 37.0* 70.0*   Creatinine 5.2* 3.9* 5.6* 4.1* 5.9*   Glucose  180* 227* 217* 169* 294*   Calcium 8.0* 8.0* 8.1* 8.1* 8.1*   Magnesium 1.8 1.7 1.9 1.9 1.8       Recent Labs   Lab 07/07/19  0426 07/06/19  0332 07/05/19  0514 07/02/19  0357 06/30/19  2256   ALT  --   --   --   --  10   AST (SGOT)  --   --   --   --  10   Bilirubin, Total  --   --   --   --  0.6   Albumin 2.4* 2.6* 2.2*  More results in Results Review 3.0*   Alkaline Phosphatase  --   --   --   --  134*   More results in Results Review = values in this interval not displayed.       Recent Labs   Lab 07/01/19  2241   Creatine Kinase (CK) 20*       Recent Labs   Lab 07/01/19  2241   PT INR 1.1   PT 12.7       Microbiology Results     Procedure Component Value Units Date/Time    COVID-19 (SARS-COV-2) [facility]) [[id number]] Collected: 06/30/19 2256    Specimen: Nasopharyngeal Swab from Nasopharynx Updated: 06/30/19 2341     Purpose of COVID testing Screening     SARS-CoV-2 Specimen Source Nasopharyngeal     SARS CoV 2 Overall Result Negative     Comment: Test performed using the Abbott ID NOW EUA assay.  Please see Fact Sheets for patients and providers located at:  http://olson-hall.info/  This test is for the qualitative detection of SARS-CoV-2  (COVID19) nucleic acid. Viral nucleic acids may persist in vivo,  independent of viability. Detection of viral nucleic acid does  not imply the presence of infectious virus, or that virus  nucleic acid is the cause of clinical symptoms. Negative  results should be treated as presumptive and, if inconsistent  with clinical signs and symptoms or necessary for patient  management, should be tested with an alternative molecular  assay. Negative results do not preclude SARS-CoV-2 infection  and should not be used as the sole basis for patient  management decisions. Invalid results may be due to inhibiting  substances in the specimen and recollection should occur.         Narrative:      Screening    CULTURE BLOOD AEROBIC AND ANAEROBIC [[id number]] Collected:  07/01/19 2340    Specimen: Blood, Venipuncture Updated: 07/02/19 0454    Narrative:      1 BLUE+1 PURPLE    CULTURE BLOOD AEROBIC AND ANAEROBIC [[id number]] Collected: 07/01/19 2340    Specimen: Blood, Venipuncture Updated: 07/02/19 0454    Narrative:      1 BLUE+1 PURPLE    Culture + Gram Stain,Aerobic, Wound [[id number]] Collected: 07/02/19 0009    Specimen: Wound from Discharge Updated: 07/02/19 0602    Narrative:      ORDER#: [id number]                                    ORDERED BY: Sheppard Coil  SOURCE: Discharge abdominal wound  COLLECTED:  07/02/19 00:09  ANTIBIOTICS AT COLL.:                                RECEIVED :  07/02/19 04:57  Stain, Gram                                FINAL       07/02/19 06:02  07/02/19   Moderate WBCs             Moderate Gram positive cocci             Moderate Gram negative rods             No Squamous epithelial cells seen  Culture and Gram Stain, Aerobic, Wound     PENDING             Wrist Right PA Lateral And Oblique    Result Date: 06/30/2019   4 views of the right wrist were compared to a similar study from 06/25/2019. No new fractures seen. No malalignment. Soft tissue swelling present. Atherosclerosis. Lorretta Harp, DO  06/30/2019 11:28 PM    CT Head WO Contrast    Result Date: 07/01/2019  Stable left posterior parietal soft tissue swelling with no acute intracranial process. Delaney Meigs, MD  07/01/2019 11:48 PM    CT Head without Contrast    Result Date: 06/30/2019   No acute intracranial findings demonstrated. No acute skull fracture. Posterior scalp hematoma. Lorretta Harp, DO  06/30/2019 11:43 PM    CT Cervical Spine without Contrast    Result Date: 06/30/2019  No evidence of acute cervical spine fracture or malalignment. Gillermina Phy, MD  06/30/2019 11:56 PM    XR Chest AP Portable    Result Date: 07/02/2019  No acute process. Delaney Meigs, MD  07/02/2019 12:36 AM    Tunneled Cath Check/Change (Permcath)    Result Date: 07/07/2019   Successful right internal jugular  tunneled dialysis catheter exchange Nelma Rothman, MD  07/03/2019 2:21 PM        Echo Results     None          RADIOLOGY     Upon my review:    Signed,  Reylynn Vanalstine  12:19 PM 07/07/2019

## 2019-07-07 NOTE — Progress Notes (Signed)
INFECTIOUS DISEASES PROGRESS NOTE    Date Time: 07/07/19 11:39 AM  Patient Name: [name],[name]  Patient status.Inpatient  Hospital Day: 5    Assessment and Plan:   1.  Abdominal wall abscess: MRSA and Pseudomonas  2.  Yeast infection      Plan: 1.  Vancomycin for MRSA, Levaquin for Pseudomonas  2.  Add nystatin and fluconazole for.  Inguinal yeast infection  Subjective:   Nontoxic    Review of Systems:   Review of Systems -complaining of burning itching.  Inguinal area    Antibiotics:   Day 3    Other medications reviewed in EPIC.  Central Access:       Physical Exam:     Vitals:    07/07/19 1116   BP: 113/67   Pulse: 73   Resp: 16   Temp: 98.4 F (36.9 C)   SpO2: 98%     Abdominal wall: Cavity is clean.  No drainage  Inguinal area: Indurated tender      Labs:     Microbiology Results     Procedure Component Value Units Date/Time    COVID-19 (SARS-COV-2) [facility]) [[id number]] Collected: 06/30/19 2256    Specimen: Nasopharyngeal Swab from Nasopharynx Updated: 06/30/19 2341     Purpose of COVID testing Screening     SARS-CoV-2 Specimen Source Nasopharyngeal     SARS CoV 2 Overall Result Negative     Comment: Test performed using the Abbott ID NOW EUA assay.  Please see Fact Sheets for patients and providers located at:  http://[facility]-hall.info/  This test is for the qualitative detection of SARS-CoV-2  (COVID19) nucleic acid. Viral nucleic acids may persist in vivo,  independent of viability. Detection of viral nucleic acid does  not imply the presence of infectious virus, or that virus  nucleic acid is the cause of clinical symptoms. Negative  results should be treated as presumptive and, if inconsistent  with clinical signs and symptoms or necessary for patient  management, should be tested with an alternative molecular  assay. Negative results do not preclude SARS-CoV-2 infection  and should not be used as the sole basis for patient  management decisions. Invalid results may be due to  inhibiting  substances in the specimen and recollection should occur.         Narrative:      Screening    CULTURE BLOOD AEROBIC AND ANAEROBIC [[id number]] Collected: 07/01/19 2340    Specimen: Blood, Venipuncture Updated: 07/07/19 0721    Narrative:      ORDER#: [id number]                                    ORDERED BY: Genia Harold, LUBNA  SOURCE: Blood, Venipuncture                          COLLECTED:  07/01/19 23:40  ANTIBIOTICS AT COLL.:                                RECEIVED :  07/02/19 04:54  Culture Blood Aerobic and Anaerobic        FINAL       07/07/19 07:21  07/07/19   No growth after 5 days of incubation.      CULTURE BLOOD AEROBIC AND ANAEROBIC [[id number]] Collected: 07/01/19 2340    Specimen:  Blood, Venipuncture Updated: 07/07/19 0721    Narrative:      ORDER#: [id number]                                    ORDERED BY: Sheppard Coil  SOURCE: Blood, Venipuncture                          COLLECTED:  07/01/19 23:40  ANTIBIOTICS AT COLL.:                                RECEIVED :  07/02/19 04:54  Culture Blood Aerobic and Anaerobic        FINAL       07/07/19 07:21  07/07/19   No growth after 5 days of incubation.      Culture + Gram Stain,Aerobic, Wound [[id number]] Collected: 07/06/19 1826    Specimen: Wound from Abscess Updated: 07/07/19 0232    Narrative:      ORDER#: [id number]                                    ORDERED BY: Rebecka Apley  SOURCE: Abscess groin                                COLLECTED:  07/06/19 18:26  ANTIBIOTICS AT COLL.:                                RECEIVED :  07/06/19 22:59  Stain, Gram                                FINAL       07/07/19 02:32  07/07/19   Few WBCs             No organisms seen  Culture and Gram Stain, Aerobic, Wound     PENDING      Culture + Gram Stain,Aerobic, Wound [[id number]] Collected: 07/02/19 0009    Specimen: Wound from Discharge Updated: 07/04/19 0934    Narrative:      ORDER#: [id number]                                    ORDERED BY: Sheppard Coil  SOURCE:  Discharge abdominal wound                    COLLECTED:  07/02/19 00:09  ANTIBIOTICS AT COLL.:                                RECEIVED :  07/02/19 04:57  Stain, Gram                                FINAL       07/02/19 06:02  07/02/19   Moderate WBCs             Moderate Gram positive cocci  Moderate Gram negative rods             No Squamous epithelial cells seen  Culture and Gram Stain, Aerobic, Wound     FINAL       07/04/19 09:34   +  07/04/19   Heavy growth of Pseudomonas aeruginosa      07/04/19   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      _____________________________________________________________________________                                  P.aeruginosa        MRSA        ANTIBIOTICS                     MIC  INTRP      MIC  INTRP      _____________________________________________________________________________  Amikacin                        <=8    S                        Aztreonam                        8     S                        Cefepime                         4     S                        Ceftazidime                      4     S                        Ciprofloxacin                 <=0.25   S                        Clindamycin                                    <=0.5   S        Daptomycin                                      <=1    S        Erythromycin                                    >4     R        Gentamicin                       4     S        <=2  S        Levofloxacin                   <=0.5   S        >4     R        Linezolid                                       <=1    S        Meropenem                      <=0.5   S                        Oxacillin                                       >2     R  D1    Penicillin                                      >1     R        Piperacillin/Tazobactam         8/4    S                        Rifampin                                       <=0.5   S        Tetracycline                                   <=0.5   S         Tobramycin                      <=2    S                        Trimethoprim/Sulfamethoxazole                 <=0.5/9  S        Vancomycin                                       1     S          -----DRUG COMMENTS----------    D1:  Oxacillin sensitivity predicts sensitivity to cephalosporins.         [place] [employer] 2016  _____________________________________________________________________________            S=SUSCEPTIBLE     I=INTERMEDIATE     R=RESISTANT                            N/S=NON-SUSCEPTIBLE  _____________________________________________________________________________  CBC w/Diff CMP   Recent Labs   Lab 07/07/19  0426 07/06/19  0332 07/05/19  0514 07/02/19  0357 07/01/19  2241 07/01/19  0347 06/30/19  2256   WBC 9.07 8.75 9.13  More results in Results Review 10.60*  More results in Results Review 13.48*   Hgb 7.1* 7.3* 7.3*  More results in Results Review 8.1*  More results in Results Review 9.1*   Hematocrit 24.4* 24.8* 24.6*  More results in Results Review 26.9*  More results in Results Review 30.8*   Platelets 125* 122* 118*  More results in Results Review 126*  More results in Results Review 153   MCV 94.6 93.9 93.9  More results in Results Review 94.1  More results in Results Review 94.5   Neutrophils  --   --   --   --  76.0  --  76.4   More results in Results Review = values in this interval not displayed.       PT/INR   Recent Labs   Lab 07/01/19  2241   PT INR 1.1       Recent Labs   Lab 07/07/19  0426 07/06/19  0332 07/05/19  0514 07/01/19  0347 06/30/19  2256   Sodium 138 138 138  More results in Results Review 132*   Potassium 4.5 4.0 4.6  More results in Results Review 5.3*   Chloride 102 102 100  More results in Results Review 97*   CO2 24 25 24   More results in Results Review 20*   BUN 42.0* 27.0* 54.0*  More results in Results Review 71.0*   Creatinine 5.2* 3.9* 5.6*  More results in Results Review 6.7*   Glucose 180* 227* 217*  More results in Results Review  271*   Calcium 8.0* 8.0* 8.1*  More results in Results Review 8.9   Magnesium 1.8 1.7 1.9  More results in Results Review  --    Phosphorus 4.1 3.6 5.6*  More results in Results Review  --    Protein, Total  --   --   --   --  8.3   Albumin 2.4* 2.6* 2.2*  More results in Results Review 3.0*   AST (SGOT)  --   --   --   --  10   ALT  --   --   --   --  10   Alkaline Phosphatase  --   --   --   --  134*   Bilirubin, Total  --   --   --   --  0.6   More results in Results Review = values in this interval not displayed.      Glucose POCT   Recent Labs   Lab 07/07/19  0426 07/06/19  0332 07/05/19  0514 07/04/19  0419 07/03/19  0604 07/02/19  0357 07/01/19  2241   Glucose 180* 227* 217* 169* 294* 229* 322*          Rads:     Radiology Results (24 Hour)     Procedure Component Value Units Date/Time    Tunneled Cath Check/Change (Permcath) [id number] Collected: 07/03/19 1401    Order Status: Completed Updated: 07/07/19 0205    Addenda:         Signed: 07/07/19 0205 by Maureen Ralphs, MD    Narrative:      PREOPERATIVE DIAGNOSIS: Malfunctioning tunneled dialysis catheter    POSTOPERATIVE DIAGNOSIS: Same    OPERATOR: Berline Lopes, MD  PROCEDURE:    1. Tunneled dialysis catheter check with contrast  2. Right internal jugular tunneled dialysis catheter exchange    TECHNIQUE:    Following informed consent, the patient was brought to interventional  radiology and placed in a supine position. The right internal jugular  tunneled dialysis catheter was prepped and draped in the usual sterile  fashion. Contrast was injected through the tunnel dialysis catheter  lumen and venography was performed. Local anesthesia was acquired with  subcutaneous 1% lidocaine. The catheter was then exchanged over wire for  a new 23 cm tip to cuff tunneled dialysis catheter. A spot fluoroscopic  image was obtained. The catheter was secured to the skin with 2-0  Prolene suture. A sterile bandage was applied.    FINDINGS: Venography does not  demonstrate a fibrin sheath. Satisfactory  positioning of the new catheter within the right atrium.    COMPLICATIONS: none    ANESTHESIA: Local anesthesia with 1% lidocaine    FLUORO DOSE:  636.3 uGym2      Impression:       Successful right internal jugular tunneled dialysis catheter  exchange    Nelma Rothman, MD   07/03/2019 2:21 PM            Signed by: Ernst Breach

## 2019-07-08 LAB — CBC
Absolute NRBC: 0 10*3/uL (ref 0.00–0.00)
Hematocrit: 24 % — ABNORMAL LOW (ref 34.7–43.7)
Hgb: 7 g/dL — ABNORMAL LOW (ref 11.4–14.8)
MCH: 27.6 pg (ref 25.1–33.5)
MCHC: 29.2 g/dL — ABNORMAL LOW (ref 31.5–35.8)
MCV: 94.5 fL (ref 78.0–96.0)
MPV: 11.8 fL (ref 8.9–12.5)
Nucleated RBC: 0 /100 WBC (ref 0.0–0.0)
Platelets: 150 10*3/uL (ref 142–346)
RBC: 2.54 10*6/uL — ABNORMAL LOW (ref 3.90–5.10)
RDW: 14 % (ref 11–15)
WBC: 14.31 10*3/uL — ABNORMAL HIGH (ref 3.10–9.50)

## 2019-07-08 LAB — RENAL FUNCTION PANEL
Albumin: 2.3 g/dL — ABNORMAL LOW (ref 3.5–5.0)
Anion Gap: 12 (ref 5.0–15.0)
BUN: 53 mg/dL — ABNORMAL HIGH (ref 7.0–19.0)
CO2: 23 mEq/L (ref 22–29)
Calcium: 8.1 mg/dL — ABNORMAL LOW (ref 8.5–10.5)
Chloride: 102 mEq/L (ref 100–111)
Creatinine: 6.1 mg/dL — ABNORMAL HIGH (ref 0.6–1.0)
Glucose: 149 mg/dL — ABNORMAL HIGH (ref 70–100)
Phosphorus: 3.8 mg/dL (ref 2.3–4.7)
Potassium: 4.7 mEq/L (ref 3.5–5.1)
Sodium: 137 mEq/L (ref 136–145)

## 2019-07-08 LAB — GLUCOSE WHOLE BLOOD - POCT
Whole Blood Glucose POCT: 126 mg/dL — ABNORMAL HIGH (ref 70–100)
Whole Blood Glucose POCT: 280 mg/dL — ABNORMAL HIGH (ref 70–100)

## 2019-07-08 LAB — HEMOLYSIS INDEX: Hemolysis Index: -1 — ABNORMAL LOW (ref 0–18)

## 2019-07-08 LAB — MAGNESIUM: Magnesium: 1.7 mg/dL (ref 1.6–2.6)

## 2019-07-08 LAB — GFR: EGFR: 8.4

## 2019-07-08 MED ORDER — SODIUM CHLORIDE 0.9 % IV BOLUS
100.0000 mL | INTRAVENOUS | Status: AC | PRN
Start: 2019-07-08 — End: 2019-07-08

## 2019-07-08 MED ORDER — SODIUM CHLORIDE 0.9 % IV BOLUS
250.0000 mL | INTRAVENOUS | Status: AC | PRN
Start: 2019-07-08 — End: 2019-07-08

## 2019-07-08 MED ORDER — MICONAZOLE NITRATE 2 % EX CREAM WITH ZINC OXIDE
TOPICAL_CREAM | Freq: Four times a day (QID) | CUTANEOUS | Status: DC
Start: 2019-07-08 — End: 2019-07-18
  Filled 2019-07-08: qty 142

## 2019-07-08 MED ORDER — ALBUMIN HUMAN 25 % IV SOLN
100.0000 mL | INTRAVENOUS | Status: AC | PRN
Start: 2019-07-08 — End: 2019-07-08
  Filled 2019-07-08: qty 100

## 2019-07-08 NOTE — Progress Notes (Signed)
SOUND HOSPITALIST  PROGRESS NOTE      Patient: [name]  Date: 07/08/2019   LOS: 6 Days  Admission Date: 06/30/2019   MRN: [id number]  Attending: Rebecka Apley  Please contact me on Epic Secure Chat       ASSESSMENT/PLAN     [name] is a 64 y.o. female admitted with Dizziness    Interval Summary:     Initially presented with mechanical fall.  Then had issue with her tunneled dialysis catheter and had that exchanged on 6/10.  On night of 6/8 patient had febrile episode with confusion.  Had wound culture sent for him her chronic abdominal wound.  Grew MRSA and Pseudomonas.  Consulted ID and started on IV vancomycin along with oral Levaquin.  Plastic surgery was consulted and recommended continued wound care with [facility] solutions.  Plastic surgery to follow peripherally.  Continued IV antibiotics.  PT recommending SNF, however, patient is refusing and states "she is not going anywhere until her wound fully heals"  Noted rise in WBC count today    Active Hospital Problems    Diagnosis    Dizziness    Fall    Open wound, abdominal wall, lateral, subsequent encounter    Uncontrolled type 2 diabetes mellitus with hyperglycemia    Hypertension    Hyperlipidemia    History of COPD    History of anemia due to chronic kidney disease    ESRD (end stage renal disease) on dialysis       MRSA and Pseudomonas Abdominal wall abscess  Mechanical fall  ESRD on HD  Malfunction of tunneled dialysis catheter s/p exchange  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 Hgb low 7 today. Iron profile suggestive of AOCD  Transient alteration in mental status - mental status back at baseline  Essential hypertension  Hyperlipidemia  Neuropathy, NOS  DM2  Inguinal yeast infection   Leukocytosis -Rising     Hemodialysis - Last session 07/07/19 and patient to resume HD at [facility] after discharge  ID consulted: Appreciate their recommendations  WBC up to 14 today- order procalcitonin  Continue  IV vancomycin and oral Levaquin,  discussed with Dr. Lahoma Crocker  Started on fluconazole and nystatin for inguinal yeast infection  Plastic surgery consultation: Appreciate recommendations. Continue dakins dressing changes BID, allow the wound to granulate and it is not ready for closure. Follow up wound clinic    PT/OT evaluation ono 6/8 recommending SNF; patient is reluctant; Asked PT to re-evulate  Continue pain control  -Acetaminophen 1000 mg 3 times daily  -Oxycodone 5 mg every 6 hours as needed moderate pain  -D/C Dilaudid 0.4 mg every 3 hours as needed severe pain  Continue bowel regimen with Colace and MiraLAX  Continue Norvasc  Continue Lipitor  Continue carvedilol  Reduce lantus to 30 units due episode of hypoglycemia on 6/13  SSI and adjust accordingly   Continue Protonix  Monitor H&H and transfuse if Hgb < 7 (borderline today)      Analgesia: Acetaminophen, oxycodone    Nutrition: Renal    DVT Prophylaxis: Heparin       Code Status: DNR    DISPO: SNF once medically stable    Family Contact: Daughter       [place] states in pain, back of head, arm.  Wanting pain meds.    6/10-  Drowsy, no acute issues    6/11-concerned about her abdominal wound.  Discussed culture results.  Pain minimal.  No other issues.    6/12 - Patient complains of some pain in abdominal wound. No complaints of fever, chills, sob.  Tolerating diet.    6/13 - Patient complains of abdominal pain at wound site. She has no complaints of fever, chills  She had a BM this AM. She had OJ when BG was 64    6/14 -patient reports feeling okay she is frustrated about the abdominal wound smell.  She states that she will not go to skilled nursing facility as recommended by therapy  Explained that we will get a reevaluation to see how she is doing  She complains of intermittent abdominal pain.  No complaints of nausea, vomiting.  She is tolerating diet    07/08/19- Patient states she is tired. She complains of abdominal pain.  She gets frustrated and states "she  is not going anywhere until her wound completely heals" when attempting to talk about discharge planning soon once antibiotic recs are finalized and WBC improves    MEDICATIONS     Current Facility-Administered Medications   Medication Dose Route Frequency    acetaminophen  1,000 mg Oral TID    amLODIPine  5 mg Oral Q12H    atorvastatin  40 mg Oral Daily    carvedilol  25 mg Oral Q12H    collagenase   Topical Q24H    darbepoetin alfa  0.45 mcg/kg Subcutaneous Weekly    fluconazole  50 mg Oral Q24H SCH    gabapentin  300 mg Oral Daily    heparin (porcine)  5,000 Units Subcutaneous Q8H [place]    insulin glargine  30 Units Subcutaneous QAM    insulin lispro  1-3 Units Subcutaneous QHS    insulin lispro  1-5 Units Subcutaneous TID AC    levoFLOXacin  500 mg Oral Q48H    miconazole 2 % with zinc oxide   Topical Q6H    nystatin  1 application Topical BID    pantoprazole  40 mg Oral BID    sevelamer  800 mg Oral TID MEALS    sodium hypochlorite   Irrigation Daily    vancomycin   Intravenous See Admin Instructions    vitamin C  500 mg Oral Daily    zinc sulfate  220 mg Oral Daily       PHYSICAL EXAM     Vitals:    07/08/19 1745   BP: 90/53   Pulse: 71   Resp: 16   Temp:    SpO2: 94%       Temperature: Temp  Min: 98.6 F (37 C)  Max: 99.7 F (37.6 C)  Pulse: Pulse  Min: 66  Max: 81  Respiratory: Resp  Min: 15  Max: 18  Non-Invasive BP: BP  Min: 90/53  Max: 137/70  Pulse Oximetry SpO2  Min: 92 %  Max: 96 %    Intake and Output Summary (Last 24 hours) at Date Time    Intake/Output Summary (Last 24 hours) at 07/08/2019 1807  Last data filed at 07/08/2019 0500  Gross per 24 hour   Intake 260 ml   Output 110 ml   Net 150 ml         GEN APPEARANCE: Normal;  A&OX3  HEENT: PERLA; EOMI; Conjunctiva Clear  NECK: Supple; No bruits  CVS: RRR, S1, S2; No M/G/R  LUNGS: CTAB; No Wheezes; No Rhonchi: No rales  ABD: Left upper quadrant abdominal wound bandage with no drainage, mild tenderness, +BS  EXT: No edema; Pulses 2+  and intact  Skin exam:  pink. Inguinal yeast infection   NEURO: CN 2-12 intact; No Focal neurological deficits  CAP REFILL:  Normal  MENTAL STATUS:  Normal    LABS     Recent Labs   Lab 07/08/19  0357 07/07/19  0426 07/06/19  0332   WBC 14.31* 9.07 8.75   RBC 2.54* 2.58* 2.64*   Hgb 7.0* 7.1* 7.3*   Hematocrit 24.0* 24.4* 24.8*   MCV 94.5 94.6 93.9   Platelets 150 125* 122*       Recent Labs   Lab 07/08/19  0357 07/07/19  0426 07/06/19  0332 07/05/19  0514 07/04/19  0419   Sodium 137 138 138 138 137   Potassium 4.7 4.5 4.0 4.6 4.1   Chloride 102 102 102 100 100   CO2 23 24 25 24 27    BUN 53.0* 42.0* 27.0* 54.0* 37.0*   Creatinine 6.1* 5.2* 3.9* 5.6* 4.1*   Glucose 149* 180* 227* 217* 169*   Calcium 8.1* 8.0* 8.0* 8.1* 8.1*   Magnesium 1.7 1.8 1.7 1.9 1.9       Recent Labs   Lab 07/08/19  0357 07/07/19  0426 07/06/19  0332   Albumin 2.3* 2.4* 2.6*       Recent Labs   Lab 07/01/19  2241   Creatine Kinase (CK) 20*       Recent Labs   Lab 07/01/19  2241   PT INR 1.1   PT 12.7       Microbiology Results     Procedure Component Value Units Date/Time    COVID-19 (SARS-COV-2) [employer] Rapid) [[id number]] Collected: 06/30/19 2256    Specimen: Nasopharyngeal Swab from Nasopharynx Updated: 06/30/19 2341     Purpose of COVID testing Screening     SARS-CoV-2 Specimen Source Nasopharyngeal     SARS CoV 2 Overall Result Negative     Comment: Test performed using the Abbott ID NOW EUA assay.  Please see Fact Sheets for patients and providers located at:  http://olson-hall.info/  This test is for the qualitative detection of SARS-CoV-2  (COVID19) nucleic acid. Viral nucleic acids may persist in vivo,  independent of viability. Detection of viral nucleic acid does  not imply the presence of infectious virus, or that virus  nucleic acid is the cause of clinical symptoms. Negative  results should be treated as presumptive and, if inconsistent  with clinical signs and symptoms or necessary for patient  management, should be  tested with an alternative molecular  assay. Negative results do not preclude SARS-CoV-2 infection  and should not be used as the sole basis for patient  management decisions. Invalid results may be due to inhibiting  substances in the specimen and recollection should occur.         Narrative:      Screening    CULTURE BLOOD AEROBIC AND ANAEROBIC [[id number]] Collected: 07/01/19 2340    Specimen: Blood, Venipuncture Updated: 07/02/19 0454    Narrative:      1 BLUE+1 PURPLE    CULTURE BLOOD AEROBIC AND ANAEROBIC [[id number]] Collected: 07/01/19 2340    Specimen: Blood, Venipuncture Updated: 07/02/19 0454    Narrative:      1 BLUE+1 PURPLE    Culture + Gram Stain,Aerobic, Wound [[id number]] Collected: 07/02/19 0009    Specimen: Wound from Discharge Updated: 07/02/19 0602    Narrative:      ORDER#: [id number]  ORDERED BY: JABBAR, LUBNA  SOURCE: Discharge abdominal wound                    COLLECTED:  07/02/19 00:09  ANTIBIOTICS AT COLL.:                                RECEIVED :  07/02/19 04:57  Stain, Gram                                FINAL       07/02/19 06:02  07/02/19   Moderate WBCs             Moderate Gram positive cocci             Moderate Gram negative rods             No Squamous epithelial cells seen  Culture and Gram Stain, Aerobic, Wound     PENDING             CT Head WO Contrast    Result Date: 07/01/2019  Stable left posterior parietal soft tissue swelling with no acute intracranial process. Delaney Meigs, MD  07/01/2019 11:48 PM    XR Chest AP Portable    Result Date: 07/02/2019  No acute process. Delaney Meigs, MD  07/02/2019 12:36 AM    Tunneled Cath Check/Change (Permcath)    Result Date: 07/07/2019   Successful right internal jugular tunneled dialysis catheter exchange Nelma Rothman, MD  07/03/2019 2:21 PM        Echo Results     None          RADIOLOGY     Upon my review:    Signed,  Caniya Tagle  6:07 PM 07/08/2019

## 2019-07-08 NOTE — Progress Notes (Signed)
Call from RN @ [facility] with request for info re: pt's MRSA and Pseudomonas.  Copy of updated ID note was faxed.  Pt is MWF 1pm @ OP HD clinic.    Freida Busman, HD Care Coordinator  [phone]

## 2019-07-08 NOTE — Progress Notes (Signed)
Pt. A/ox4; no SOB- on Room air; C/o pain when chest pain when breathing as stated. Pt. Claims that nobody is paying attention to her complaint. Rt. CVC patent, w/good flow. BP  On the low 00's. Will monitor pt. Closely for progress.

## 2019-07-08 NOTE — Progress Notes (Signed)
[facility] PROGRESS NOTE  Aaron Edelman, x [phone] [facility] Spectralink)      Date Time: 07/08/19 2:59 PM  Patient Name: [name]  Attending Physician: Rebecka Apley, MD    CC: follow-up ESRD  HD note    Assessment:     1. ESRD- on HD at [facility]  2. Volume overload- mild  3. HTN  4. Anemia- of CKD stable  5. Secondary HPT  6. Abdominal surgical wound- not healing, MRSA and Pseudomonas  7. Recurrent falls      Recommendations:   1. Presently being dialyzed aiming for 2.5L net ultrafiltration; next treatment on Thursday  2. Stable from renal standpoint     Dialysis Treatment Data:  Blood flow rate: 350 mL/min  Dialysate flow rate: 700 mL/min  Ultrafiltration rate: 870 mL/hr    Dialysate potassium: 2 mEq/L  Dialysate calcium: 2.5 mEq/L    Arterial pressure: -241 mmHg  Venous pressure: 166mmHg      Case discussed with: pt, HD RN    Verl Blalock, MD  [facility]  [phone] (office)  X [phone] (FFX Spectra-Link)      Subjective: neck/left upper chest pain when she breaths, also to palpation=    Review of Systems:   Cardio: no chest pain, trace edema  Pulm: no SOB  GI: no nausea, vomiting or diarrhea      Physical Exam:     Vitals:    07/08/19 1400 07/08/19 1415 07/08/19 1430 07/08/19 1445   BP: 116/58 113/68 112/62 107/60   Pulse: 76 76 75 77   Resp: 17 17 18 17    Temp: 99.4 F (37.4 C)      TempSrc:       SpO2: 95% 96% 95% 94%   Weight:       Height:           Intake and Output Summary (Last 24 hours) at Date Time    Intake/Output Summary (Last 24 hours) at 07/08/2019 1459  Last data filed at 07/08/2019 0500  Gross per 24 hour   Intake 510 ml   Output 230 ml   Net 280 ml     General: awake, alert, oriented x 3, no acute distress.  Chest: tender to palpation in the left upper chest  Cardiovascular: regular rate and rhythm  Lungs: clear to auscultation bilaterally, bilateral air entry, normal work of breathing  Abdomen: soft; LLQ wound bandaged  Extremities: trace  edema  Other: RIJ Permcath    Meds:      Scheduled Meds: PRN Meds:    acetaminophen, 1,000 mg, Oral, TID  amLODIPine, 5 mg, Oral, Q12H  atorvastatin, 40 mg, Oral, Daily  carvedilol, 25 mg, Oral, Q12H  collagenase, , Topical, Q24H  darbepoetin alfa, 0.45 mcg/kg, Subcutaneous, Weekly  fluconazole, 50 mg, Oral, Q24H SCH  gabapentin, 300 mg, Oral, Daily  heparin (porcine), 5,000 Units, Subcutaneous, Q8H SCH  insulin glargine, 30 Units, Subcutaneous, QAM  insulin lispro, 1-3 Units, Subcutaneous, QHS  insulin lispro, 1-5 Units, Subcutaneous, TID AC  levoFLOXacin, 500 mg, Oral, Q48H  miconazole 2 % with zinc oxide, , Topical, Q6H  nystatin, 1 application, Topical, BID  pantoprazole, 40 mg, Oral, BID  sevelamer, 800 mg, Oral, TID MEALS  sodium hypochlorite, , Irrigation, Daily  vancomycin, , Intravenous, See Admin Instructions  vitamin C, 500 mg, Oral, Daily  zinc sulfate, 220 mg, Oral, Daily          Continuous Infusions:   dextrose, 15 g of glucose, PRN  And  dextrose, 12.5 g, PRN   And  glucagon (rDNA), 1 mg, PRN  melatonin, 3 mg, QHS PRN  naloxone, 0.2 mg, PRN  ondansetron, 4 mg, Q6H PRN   Or  ondansetron, 4 mg, Q6H PRN  oxyCODONE, 5 mg, Q6H PRN              Labs:     Recent Labs   Lab 07/08/19  0357 07/07/19  0426 07/06/19  0332   WBC 14.31* 9.07 8.75   Hgb 7.0* 7.1* 7.3*   Hematocrit 24.0* 24.4* 24.8*   Platelets 150 125* 122*     Recent Labs   Lab 07/08/19  0357 07/07/19  0426 07/06/19  0332   Sodium 137 138 138   Potassium 4.7 4.5 4.0   Chloride 102 102 102   CO2 23 24 25    BUN 53.0* 42.0* 27.0*   Creatinine 6.1* 5.2* 3.9*   Calcium 8.1* 8.0* 8.0*   Albumin 2.3* 2.4* 2.6*   Phosphorus 3.8 4.1 3.6   Magnesium 1.7 1.8 1.7   Glucose 149* 180* 227*   EGFR 8.4 10.1 14.0       Recent Labs   Lab 07/01/19  2241   Urine Type Urine, Clean Ca   Color, UA Yellow   Clarity, UA Hazy   Specific Gravity UA 1.015   Urine pH 6.0   Nitrite, UA Negative   Ketones UA Negative   Urobilinogen, UA Negative   Bilirubin, UA Negative    Blood, UA Negative   RBC, UA 3 - 5   WBC, UA 0 - 5                 Signed by: Verl Blalock, MD

## 2019-07-08 NOTE — Progress Notes (Signed)
Progress Note- Pharmacy Vancomycin Dosing Consult:  Day 5  Vancomycin Indication: Wound infection   Vancomycin Target Trough Level: Pre-HD level of 15-25 mcg/mL    Pertinent Cultures:    Date Source  Organism & Pertinent Susceptibilities     6/8 Blood cx x2 NG x 5 days    6/9 Wound cx Heavy growth of Pseudomonas aeruginosa   Heavy growth of MRSA   6/13 Wound cx (repeated) Few WBCs   No organisms seen   Final results pending         As appropriate, contact physician to consider change in therapy if cultures grow organism other than MRSA     Serum creatinine: 6.1 mg/dL (H) 07/08/19 0357  Estimated creatinine clearance: 11 mL/min (A)    Baseline SCr: ESRD on HD Tues, Thurs, Sat   Nephrotoxic drugs administered in the past 48 hours: None    Assessment  Measured Vancomycin Level: Pre-HD random level = 29.5 (supra-therapeutic).   Recent Labs   Lab 07/07/19  0426 07/05/19  0514   Vancomycin Random 29.5 16.0         Recommendations:  Due to supra-therapeutic pre-HD random level of 29.5 mcg/mL, will hold vancomycin for today.  New Vancomycin level: Pre-HD random level ordered for Thursday, 6/17 with AM labs.     Thank you,  Johann Capers

## 2019-07-08 NOTE — Consults (Signed)
Wound Ostomy Continence Consultation    Date Time: 07/08/19 10:44 AM  Patient Name: [name],[name]  Consulting Service: [place]  Reason for Consult: Incontinence/ moisture    Assessment & Plan     Assessment:      MASD- Gluteal cleft, sacrum, bilateral buttocks, perirectal, perineal area, bilateral groin, - the wound has partial thickness,  lesion noted underneath. Peri-wound macerated and denuded. Moisture-associated skin damage due to the moisture trap, functional incontinence of the bowel and bladder.    Recommendation/Plan:       Skin care: Gluteal cleft, sacrum, bilateral buttocks, perirectal, perineal area, bilateral groin,    1. Clean affected area with wound cleanser/peri-wipes every 6 hours and PRN.  2. Apply thin layer Baza (miconazole) cream every 6 hours and as needed with each soilage episode.  3. Leave open to the air, DO NOT USE FOAM DRESSING    4. Please do not remove all of the Baza cream with each treatment; leave a thin white layer and apply more cream as needed.     Baza Antifungal (miconazole nitrate 2%) is a moisture barrier cream that inhibits fungal growth and treats candidiasis, jock itch, ringworm, and athletes foot - also provides a moisture barrier against urine and feces. It has skin conditioners and is CHG compatible.    Katha Hamming is ordered from the pharmacy.    Initiate/ continue pressure prevention bundle:    Head of the bed 30 degrees or less  Positioning device to the bedside  Eliminate/minimize pressure from the area  Float heels with boots or pillows  Turn patient  Pressure redistribution cushion to the chair  Use lift sheets/low friction surface sheets for positioning  Pad bony prominences  Nutrition consult/Optimize nutrition  Initiate bed algorithm/Specialty bed  Moisture/Incontinence management - Cleanse with incontinence cleansing wipe/water to manage incontinence and to protect skin from exposure to urine and stool. Apply skin barrier protection cream. Apply [place]/external or  Foley catheter to prevent urinary contamination of the wound. Apply rectal pouch/fecal management system per unit policy, to prevent fecal contamination of a wound or to contain diarrhea.                      Wound care orders entered into EMR. Care rendered. Please re-consult the [facility] Nurse team if the wound deteriorates or further assistance required.        Objective Findings   Specialty Bed:     [place]    Braden Score: Braden Scale Score: 20 (07/07/19 1920)      Wound Assessment:   Wound 05/27/19 Surgical Incision Abdomen Other (Comment) 4x4, abd, tape  (Active)   Site Description Black 07/07/19 2149   Peri-wound Description Clean;Dry;Intact 07/07/19 2149   Drainage Amount Scant 07/04/19 0800   Drainage Description Thin 07/04/19 0800   Treatments Site care;Pharmaceutical agent;Cleansed 07/04/19 0800   Dressing Packing;Foam 07/04/19 1300   Dressing Changed Changed 07/04/19 0800   Dressing Status Clean;Dry;Intact 07/07/19 2149       Wound 07/06/19 Thigh Right Swellon, hard, and a little open area (Active)   Site Description Clean;Dry 07/07/19 2150   Peri-wound Description Clean;Dry 07/07/19 2150   Dressing Status Clean;Dry;Intact 07/07/19 2150       Wound 07/08/19 Moisture Associated Skin Damage (MASD) Perineum (Active)              History of Presenting Illness   HPI:   ENEIDA Young is a 64 y.o. female with a PMHx of HTN, HLD, insulin  dependent DM, COPD, ESRD on HD, peritoneal dialysis associated Psudomonas/E. Coli peritonitis, upper GI bleed secondary to gastritis 05/13/19 and anemia of CKD who presented with fall. Patient discharged home 06/29/19 after admission for RUE numbness/pain secondary to triquetral fracture with possible gout/pseudogout flare. At home patient with two mechanical falls, reports tripping over cane, dizziness, and Young leg giving out. Endorses head trauma with fall as well as generalized body pain worse in R wrist and Young leg. States she was not down long, received help from people in her  building. Denies LOC, chest pain, SOB. Additionally patient was scheduled to receive dialysis today, but reports transport never arrived. When she followed up she was told there was a scheduling error, note from Dr. Higinio Plan    Past Medical and Surgical History     Past Medical History:   Diagnosis Date    Chronic obstructive pulmonary disease     Diabetes mellitus     Hyperlipidemia     Hypertension     Sleep apnea 2018       Past Surgical History:   Procedure Laterality Date    EGD, BIOPSY N/A 05/14/2019    Procedure: EGD, BIOPSY;  Surgeon: Ronie Spies, MD;  Location: [facility];  Service: Gastroenterology;  Laterality: N/A;    EXPLORATORY LAPAROTOMY N/A 05/27/2019    Procedure: EXPLORATORY LAPAROTOMY;  Surgeon: Herbert Moors., MD;  Location: [facility];  Service: General;  Laterality: N/A;    JOINT REPLACEMENT      shoulder    JOINT REPLACEMENT      knee    OVARY SURGERY Left     Removal    REMOVAL, FOREIGN BODY N/A 05/27/2019    Procedure: REMOVAL, PERITONEAL DIALYSIS CATHETER;  Surgeon: Herbert Moors., MD;  Location: [facility];  Service: General;  Laterality: N/A;    TUNNELED CATH CHECK/CHANGE (PERMCATH) N/A 07/03/2019    Procedure: TUNNELED CATH CHECK/CHANGE;  Surgeon: Maureen Ralphs, MD;  Location: [facility];  Service: Interventional Radiology;  Laterality: N/A;    TUNNELED CATH PLACEMENT (PERMCATH) Bilateral 05/23/2019    Procedure: TUNNELED CATH PLACEMENT;  Surgeon: Jacques Earthly, MD;  Location: [facility];  Service: Interventional Radiology;  Laterality: Bilateral;     POD: @ORDAYSPST  @LOS :  LOS: 6 days         Lab   Significant Lab Values:    Recent Labs     07/08/19  0357   WBC 14.31*   RBC 2.54*   Hgb 7.0*   Hematocrit 24.0*   Sodium 137   Potassium 4.7   Chloride 102   CO2 23   BUN 53.0*   Creatinine 6.1*   Calcium 8.1*   Albumin 2.3*   EGFR 8.4   Glucose 149*    Prealbumin: No components found for: PREALBUMIN  Culture     Lajada Janes "Sunshine" Jylian Pappalardo BSN, RN,  [employer]  Wound, Ostomy, and Continence Nurse Coordinator  [facility]  [address], [place], [place] [address]  T [phone] S [phone]  [email or web address]

## 2019-07-08 NOTE — Plan of Care (Addendum)
Patient alert and oriented X4, able to verbalized needs and call as needed. Patient on room air and no telemetry. Wound dressing completed .  Patient able to walk the bathroom with walker, Bed linen and gown changed. Abd wound dressing clean, dry and intact. Fall and safety precautions implemented.        Problem: Safety  Goal: Patient will be free from injury during hospitalization  Outcome: Progressing  Goal: Patient will be free from infection during hospitalization  Outcome: Progressing     Problem: Pain  Goal: Pain at adequate level as identified by patient  Outcome: Progressing     Problem: Infection  Goal: Free from infection  Outcome: Progressing     Problem: Nutrition  Goal: Nutritional intake is adequate  Outcome: Progressing     Problem: Discharge Barriers  Goal: Patient will be discharged home or other facility with appropriate resources  Outcome: Progressing

## 2019-07-08 NOTE — Progress Notes (Signed)
HD tx. Completed fairly tolerated; SBP stable on the 90's almost throughout dialysis- No discomfort dialysiswise.   07/08/19 1815   Treatment Summary   Time Off Machine 1800   Duration of Treatment (Hours) 3.5   Treatment Type 1:1   Dialyzer Clearance Heavily streaked   Fluid Volume Off (mL) 2900   Prime Volume (mL) 200   Rinseback Volume (mL) 200   Fluid Given: Normal Saline (mL) 0   Fluid Given: PRBC  0 mL   Fluid Given: Albumin (mL) 0   Fluid Given: Other (mL) 0   Total Fluid Given 400   Hemodialysis Net Fluid Removed 2500   Post Treatment Assessment   Post-Treatment Weight (Kg) 107.4   Patient Response to Treatment fairly tolerated   Additional Dialyzer Used 0   Permacath Catheter - Tunneled 07/03/19 Right   Placement Date/Time: 07/03/19 1335   Inserted by: Lavone Neri MD  Orientation: Right  Catheter Tip Location: (c) SVC  Central Line Infection Prevention Education provided?: Yes  Hand Hygiene: Chlorhexidine  Line cart used?: No  Line kit used?: Yes  Maximum ...   Line necessity reviewed? Apheresis/hemodialysis   Catheter Lumen Volume Venous 1.7 mL   Catheter Lumen Volume Arterial 1.7 mL   Dressing Status and Intervention Dressing Intact;Clean & Dry   Tego/Curos Caps on Catheter Yes   NEW Tego/Curos Caps placed (Date) 07/09/19   End Caps Free From Blood Yes   Line Care Connections checked and tightened   Dressing Type Transparent;Biopatch   Dressing Status Clean;Dry;Intact   Vitals   Temp 97.8 F (36.6 C)   Heart Rate 72   Resp Rate 18   BP 103/60   SpO2 94 %   O2 Device Nasal cannula   Assessment   Mental Status Alert;Oriented   Cardiac (WDL) WDL   Cardiac Regularity Regular   Cardiac Symptoms None   Cardiac Rhythm Normal Sinus Rhythm   Respiratory  WDL   Respiratory Pattern Regular   Bilateral Breath Sounds Diminished   R Breath Sounds Clear;Diminished   L Breath Sounds Clear;Diminished   Edema  WDL   Generalized Edema None   Facial None   Sacral UTA   RLE Edema Non Pitting Edema   LLE Edema Non Pitting Edema    General Skin Color Appropriate for ethnicity   Skin Condition/Temp Warm;Dry   Gastrointestinal (WDL) WDL   Abdomen Inspection Nondistended   GI Symptoms None   Mobility Ambulatory with Assistance   Pain Assessment   Charting Type Assessment   Pain Scale Used Numeric Scale (0-10)   Numeric Pain Scale   Pain Score 0   POSS Score S   Education   Person taught Patient   Knowledge basis Minimal   Topics taught K+;Procedure   Teaching Tools Explain   Reponse Verbalizes Understanding   Bedside Nurse Communication   Name of bedside RN - post dialysis Akogne Mbah Ambe   . Able to achieve UF Goal ordered; Rt. CVC - worked well; Venous and Arterial chambers w/ large clots. Will address issue to MD. Report given to [place] RN.

## 2019-07-08 NOTE — Plan of Care (Signed)
Problem: Renal Instability  Goal: Fluid and electrolyte balance are achieved/maintained  Flowsheets (Taken 07/08/2019 1926)  Fluid and electrolyte balance are achieved/maintained:   Monitor intake and output every shift   Monitor daily weight   Monitor/assess lab values and report abnormal values   Assess and reassess fluid and electrolyte status   Assess for confusion/personality changes   Observe for cardiac arrhythmias   Follow fluid restrictions/IV/PO parameters  Goal: Free from infection  Flowsheets (Taken 07/08/2019 1926)  Free from infection: Monitor/assess for signs and symptoms of infection     Problem: Patient Receiving Advanced Renal Therapies  Goal: Therapy access site remains intact  Flowsheets (Taken 07/08/2019 1926)  Therapy access site remains intact:   Assess therapy access site   Change therapy access site dressing as needed

## 2019-07-08 NOTE — Plan of Care (Signed)
Patient alert and oriented x 4 with intermittent forgetfulness, no respiratory distress noted and able to verbalize needs and assisted as needed. Skin warm and dry.No complain of shortness of breath or discomfort, chest pressure or tightness. Abdomen soft and non-distended.  Patient verbalized pain and oxycodone x 1 administered.  Fall and safety precautions in place. Diet was well tolerated.  Problem: Safety  Goal: Patient will be free from injury during hospitalization  Outcome: Progressing  Flowsheets (Taken 07/06/2019 1239 by Venia Minks, RN)  Patient will be free from injury during hospitalization:   Assess patient's risk for falls and implement fall prevention plan of care per policy   Provide and maintain safe environment   Use appropriate transfer methods   Ensure appropriate safety devices are available at the bedside   Hourly rounding  Goal: Patient will be free from infection during hospitalization  Outcome: Progressing  Flowsheets (Taken 07/08/2019 2041)  Free from Infection during hospitalization:   Assess and monitor for signs and symptoms of infection   Monitor lab/diagnostic results   Monitor all insertion sites (i.e. indwelling lines, tubes, urinary catheters, and drains)     Problem: Pain  Goal: Pain at adequate level as identified by patient  Outcome: Progressing  Flowsheets (Taken 07/05/2019 1658 by Venia Minks, RN)  Pain at adequate level as identified by patient:   Identify patient comfort function goal   Assess for risk of opioid induced respiratory depression, including snoring/sleep apnea. Alert healthcare team of risk factors identified.   Assess pain on admission, during daily assessment and/or before any "as needed" intervention(s)   Reassess pain within 30-60 minutes of any procedure/intervention, per Pain Assessment, Intervention, Reassessment (AIR) Cycle   Evaluate if patient comfort function goal is met   Evaluate patient's satisfaction with pain management progress   Offer  non-pharmacological pain management interventions     Problem: Compromised Tissue integrity  Goal: Damaged tissue is healing and protected  Outcome: Progressing  Flowsheets (Taken 07/05/2019 0203 by Maryland Pink, RN)  Damaged tissue is healing and protected:   Monitor/assess Braden scale every shift   Reposition patient every 2 hours and as needed unless able to reposition self   Increase activity as tolerated/progressive mobility   Relieve pressure to bony prominences for patients at moderate and high risk   Keep intact skin clean and dry   Use incontinence wipes for cleaning urine, stool and caustic drainage. Foley care as needed   Monitor external devices/tubes for correct placement to prevent pressure, friction and shearing   Encourage use of lotion/moisturizer on skin   Consult/collaborate with wound care nurse   Consider placing an indwelling catheter if incontinence interferes with healing of stage 3 or 4 pressure injury   Utilize specialty bed   Monitor patient's hygiene practices   Use bath wipes, not soap and water, for daily bathing   Avoid shearing injuries   Provide wound care per wound care algorithm  Goal: Nutritional status is improving  Outcome: Progressing  Flowsheets (Taken 07/08/2019 2041)  Nutritional status is improving:   Allow adequate time for meals   Assist patient with eating   Encourage patient to take dietary supplement(s) as ordered     Problem: Renal Instability  Goal: Fluid and electrolyte balance are achieved/maintained  Outcome: Progressing  Flowsheets (Taken 07/08/2019 1926 by Mickel Baas, RN)  Fluid and electrolyte balance are achieved/maintained:   Monitor intake and output every shift   Monitor daily weight   Monitor/assess lab values and report  abnormal values   Assess and reassess fluid and electrolyte status   Assess for confusion/personality changes   Observe for cardiac arrhythmias   Follow fluid restrictions/IV/PO parameters     Problem: Patient Receiving Advanced  Renal Therapies  Goal: Therapy access site remains intact  Flowsheets (Taken 07/08/2019 1926 by Mickel Baas, RN)  Therapy access site remains intact:   Assess therapy access site   Change therapy access site dressing as needed     Problem: Diabetes: Glucose Imbalance  Goal: Blood glucose stable at established goal  Outcome: Progressing  Flowsheets (Taken 07/06/2019 1239 by Venia Minks, RN)  Blood glucose stable at established goal:   Monitor lab values   Monitor intake and output.  Notify LIP if urine output is < 30 mL/hour.   Include patient/family in decisions related to nutrition/dietary selections   Follow fluid restrictions/IV/PO parameters   Assess for hypoglycemia /hyperglycemia   Monitor/assess vital signs   Coordinate medication administration with meals, as indicated   Ensure appropriate diet and assess tolerance   Ensure adequate hydration   Ensure appropriate consults are obtained (Nutrition, Diabetes Education, and Case Management)   Ensure patient/family has adequate teaching materials     Problem: Infection  Goal: Free from infection  Outcome: Progressing  Flowsheets (Taken 07/06/2019 1239 by Venia Minks, RN)  Free from infection:   Assess for signs/symptoms of infection   Utilize isolation precautions per protocol/policy   Consult/collaborate with Infection Preventionist   Assess immunization status   Utilize sepsis protocol     Problem: Nutrition  Goal: Nutritional intake is adequate  Outcome: Progressing  Flowsheets (Taken 07/01/2019 1336 by Mickel Baas, RN)  Nutritional intake is adequate:   Monitor daily weights   Consult/collaborate with Clinical Nutritionist   Assess anorexia, appetite, and amount of meal/food tolerated   Include patient/patient care companion in decisions related to nutrition

## 2019-07-09 LAB — CBC
Absolute NRBC: 0 10*3/uL (ref 0.00–0.00)
Hematocrit: 21.6 % — ABNORMAL LOW (ref 34.7–43.7)
Hgb: 6.3 g/dL — ABNORMAL LOW (ref 11.4–14.8)
MCH: 27.4 pg (ref 25.1–33.5)
MCHC: 29.2 g/dL — ABNORMAL LOW (ref 31.5–35.8)
MCV: 93.9 fL (ref 78.0–96.0)
MPV: 11.8 fL (ref 8.9–12.5)
Nucleated RBC: 0 /100 WBC (ref 0.0–0.0)
Platelets: 146 10*3/uL (ref 142–346)
RBC: 2.3 10*6/uL — ABNORMAL LOW (ref 3.90–5.10)
RDW: 14 % (ref 11–15)
WBC: 12.33 10*3/uL — ABNORMAL HIGH (ref 3.10–9.50)

## 2019-07-09 LAB — PROCALCITONIN: Procalcitonin: 0.83 — ABNORMAL HIGH (ref 0.00–0.10)

## 2019-07-09 LAB — HEMOLYSIS INDEX: Hemolysis Index: -2 — ABNORMAL LOW (ref 0–18)

## 2019-07-09 LAB — RENAL FUNCTION PANEL
Albumin: 2.2 g/dL — ABNORMAL LOW (ref 3.5–5.0)
Anion Gap: 10 (ref 5.0–15.0)
BUN: 28 mg/dL — ABNORMAL HIGH (ref 7.0–19.0)
CO2: 26 mEq/L (ref 22–29)
Calcium: 7.9 mg/dL — ABNORMAL LOW (ref 8.5–10.5)
Chloride: 101 mEq/L (ref 100–111)
Creatinine: 4.1 mg/dL — ABNORMAL HIGH (ref 0.6–1.0)
Glucose: 170 mg/dL — ABNORMAL HIGH (ref 70–100)
Phosphorus: 3.3 mg/dL (ref 2.3–4.7)
Potassium: 4.2 mEq/L (ref 3.5–5.1)
Sodium: 137 mEq/L (ref 136–145)

## 2019-07-09 LAB — MAGNESIUM: Magnesium: 1.7 mg/dL (ref 1.6–2.6)

## 2019-07-09 LAB — GLUCOSE WHOLE BLOOD - POCT
Whole Blood Glucose POCT: 200 mg/dL — ABNORMAL HIGH (ref 70–100)
Whole Blood Glucose POCT: 204 mg/dL — ABNORMAL HIGH (ref 70–100)
Whole Blood Glucose POCT: 271 mg/dL — ABNORMAL HIGH (ref 70–100)
Whole Blood Glucose POCT: 222 mg/dL — ABNORMAL HIGH (ref 70–100)

## 2019-07-09 LAB — HEMOGLOBIN AND HEMATOCRIT, BLOOD
Hematocrit: 23 % — ABNORMAL LOW (ref 34.7–43.7)
Hgb: 6.8 g/dL — ABNORMAL LOW (ref 11.4–14.8)

## 2019-07-09 LAB — TYPE AND SCREEN
AB Screen Gel: NEGATIVE
ABO Rh: AB POS

## 2019-07-09 LAB — GFR: EGFR: 13.2

## 2019-07-09 MED ORDER — SODIUM CHLORIDE 0.9 % IV SOLN
INTRAVENOUS | Status: DC | PRN
Start: 2019-07-09 — End: 2019-07-09

## 2019-07-09 MED ORDER — SODIUM CHLORIDE 0.9 % IV SOLN
INTRAVENOUS | Status: DC | PRN
Start: 2019-07-09 — End: 2019-07-18

## 2019-07-09 NOTE — Plan of Care (Signed)
Problem: Safety  Goal: Patient will be free from injury during hospitalization  Outcome: Progressing  Flowsheets (Taken 07/09/2019 0704 by Ezequiel Essex, RN)  Patient will be free from injury during hospitalization:   Assess patient's risk for falls and implement fall prevention plan of care per policy   Use appropriate transfer methods   Ensure appropriate safety devices are available at the bedside   Include patient/ family/ care giver in decisions related to safety   Provide and maintain safe environment   Hourly rounding     Problem: Compromised Tissue integrity  Goal: Damaged tissue is healing and protected  Outcome: Progressing  Flowsheets (Taken 07/09/2019 0704 by Ezequiel Essex, RN)  Damaged tissue is healing and protected:   Monitor/assess Braden scale every shift   Reposition patient every 2 hours and as needed unless able to reposition self   Provide wound care per wound care algorithm   Increase activity as tolerated/progressive mobility   Avoid shearing injuries   Keep intact skin clean and dry   Use bath wipes, not soap and water, for daily bathing   Relieve pressure to bony prominences for patients at moderate and high risk     Problem: Nutrition  Goal: Nutritional intake is adequate  Outcome: Progressing  Flowsheets (Taken 07/01/2019 1336 by Mickel Baas, RN)  Nutritional intake is adequate:   Monitor daily weights   Consult/collaborate with Clinical Nutritionist   Assess anorexia, appetite, and amount of meal/food tolerated   Include patient/patient care companion in decisions related to nutrition    Patient alert and oriented x4. Patient denies pain, SOB, dizziness. Scheduled medications given. Fall bundle in place. Will continue monitor.

## 2019-07-09 NOTE — Plan of Care (Signed)
Problem: Safety  Goal: Patient will be free from injury during hospitalization  Outcome: Progressing  Flowsheets (Taken 07/09/2019 0704)  Patient will be free from injury during hospitalization:   Assess patient's risk for falls and implement fall prevention plan of care per policy   Use appropriate transfer methods   Ensure appropriate safety devices are available at the bedside   Include patient/ family/ care giver in decisions related to safety   Provide and maintain safe environment   Hourly rounding     Problem: Compromised Tissue integrity  Goal: Damaged tissue is healing and protected  Outcome: Progressing  Flowsheets (Taken 07/09/2019 0704)  Damaged tissue is healing and protected:   Monitor/assess Braden scale every shift   Reposition patient every 2 hours and as needed unless able to reposition self   Provide wound care per wound care algorithm   Increase activity as tolerated/progressive mobility   Avoid shearing injuries   Keep intact skin clean and dry   Use bath wipes, not soap and water, for daily bathing   Relieve pressure to bony prominences for patients at moderate and high risk

## 2019-07-09 NOTE — Progress Notes (Signed)
Pt is alert and oriented x 4. No c/o of dizziness,sob or pain. On RA. Vital signs are within the normal limits. Wound is covered and dressing is clean,dry and intact..  Due meds given as ordered.Fall bundle in place and will continue to monitor pt

## 2019-07-09 NOTE — Progress Notes (Addendum)
[facility]  PROGRESS NOTE      Patient: [name]  Date: 07/09/2019   LOS: 7 Days  Admission Date: 06/30/2019   MRN: [id number]  Attending: Dillard Essex  Please contact me via Secure Chat       ASSESSMENT/PLAN     [name] is a 64 y.o. female admitted with Dizziness    Interval Summary:     Active Hospital Problems    Diagnosis    Dizziness    Fall    Open wound, abdominal wall, lateral, subsequent encounter    Uncontrolled type 2 diabetes mellitus with hyperglycemia    Hypertension    Hyperlipidemia    History of COPD    History of anemia due to chronic kidney disease    ESRD (end stage renal disease) on dialysis      Chart reviewed, the patient has been admitted with febrile illness, confusion and progress to falls.  Patient was found to have abdominal wall cellulitis/wound.  Cultures turn positive methicillin resistant Staphylococcus aureus and Pseudomonas aeruginosa.  Patient has been treated with combination of IV vancomycin in levofloxacin.  ID note and recommendations appreciated.  Patient also had been evaluated by plastic surgery.Patient's lab shows anemia, hemoglobin low at 6.4 repeated hemoglobin 6.8.  Patient is on dialysis.  Considering patient's progress to falls and complaint of dizziness.  Will give 1 unit packed RBCs transfusion.  Consent obtained.  We will proceed with giving 1 unit packed RBCs transfusion now however if further transfusion is needed will be given during dialysis.      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 Hgb low 7 today. Iron profile suggestive of AOCD  Transient alteration in mental status - mental status back at baseline  Essential hypertension  Hyperlipidemia  Neuropathy, NOS  DM2  Inguinal yeast infection   Continue  IV vancomycin and oral Levaquin, discussed with Dr. Lahoma Crocker  Started on fluconazole and nystatin for inguinal yeast infection  Plastic surgery consultation: Appreciate recommendations. Continue dakins dressing changes BID, allow the wound to granulate and it is not ready for closure. Follow up wound clinic     PT/OT evaluation ono 6/8 recommending SNF; patient is reluctant; Asked PT to re-evulate  Continue pain control  -Acetaminophen 1000 mg 3 times daily  -Oxycodone 5 mg every 6 hours as needed moderate pain  Continue bowel regimen with Colace and MiraLAX  Continue Norvasc  Continue Lipitor  Continue carvedilol  Reduce lantus to 30 units due episode of hypoglycemia on 6/13  SSI and adjust accordingly   Continue Protonix  Monitor H&H and transfuse if Hgb < 7 (borderline today)      Analgesia: Acetaminophen, oxycodone    Nutrition: Renal    DVT Prophylaxis: Heparin       Code Status: DNR    DISPO: SNF once medically stable    Family Contact: Daughter       SUBJECTIVE  [name] states in pain, back of head, arm.  Wanting pain meds.    6/10-  Drowsy, no acute issues    6/11-concerned about her abdominal wound.  Discussed culture results.  Pain minimal.  No other issues.    6/12 - Patient complains of some pain in abdominal wound. No complaints of fever, chills, sob.  Tolerating diet.    6/13 - Patient complains of abdominal pain at wound site. She has no complaints of fever, chills  She had a BM this AM. She had OJ when BG was 64    6/14 -patient reports feeling okay she is frustrated about the abdominal wound smell.  She states that she will not go to skilled nursing facility as recommended by therapy  Explained that we will get a reevaluation to see  how she is doing  She complains of intermittent abdominal pain.  No complaints of nausea, vomiting.  She is tolerating diet    07/08/19- Patient states she is tired. She complains of abdominal pain.  She gets frustrated and states "she is not going anywhere until her wound completely heals" when attempting to talk about discharge planning soon once antibiotic recs are finalized and WBC improves                Analgesia: as needed analgesic    Nutrition: Cardiac diabetic diet    DVT Prophylaxis: Heparin products       Code Status: Full code    DISPO: PT OT recommends skilled nursing facility    Family Contact:        McDonald states patient complains of dizziness lightheadedness and progress to falls.  Patient with symptomatic anemia hemoglobin  under 7.  Denies fever or chills.  No shortness of breath today    MEDICATIONS     Current Facility-Administered Medications   Medication Dose Route Frequency    acetaminophen  1,000 mg Oral TID    amLODIPine  5 mg Oral Q12H    atorvastatin  40 mg Oral Daily    carvedilol  25 mg Oral Q12H    collagenase   Topical Q24H    darbepoetin alfa  0.45 mcg/kg Subcutaneous Weekly    fluconazole  50 mg Oral Q24H SCH    gabapentin  300 mg Oral Daily    heparin (porcine)  5,000 Units Subcutaneous Q8H [place]    insulin glargine  30 Units Subcutaneous QAM    insulin lispro  1-3 Units Subcutaneous QHS    insulin lispro  1-5 Units Subcutaneous TID AC    levoFLOXacin  500 mg Oral Q48H    miconazole 2 % with zinc oxide   Topical Q6H    nystatin  1 application Topical BID    pantoprazole  40 mg Oral BID    sevelamer  800 mg Oral TID MEALS    sodium hypochlorite   Irrigation Daily    vancomycin   Intravenous See Admin Instructions    vitamin C  500 mg Oral Daily    zinc sulfate  220 mg Oral Daily       PHYSICAL EXAM     Vitals:    07/09/19 0719   BP: 109/69   Pulse: 72   Resp: 18   Temp: 98.8 F (37.1 C)   SpO2: 93%       Temperature: Temp  Min: 97.8 F  (36.6 C)  Max: 99.4 F (37.4 C)  Pulse: Pulse  Min:  20  Max: 80  Respiratory: Resp  Min: 15  Max: 18  Non-Invasive BP: BP  Min: 90/53  Max: 126/71  Pulse Oximetry SpO2  Min: 91 %  Max: 96 %    Intake and Output Summary (Last 24 hours) at Date Time    Intake/Output Summary (Last 24 hours) at 07/09/2019 0902  Last data filed at 07/09/2019 0630  Gross per 24 hour   Intake 530 ml   Output 2650 ml   Net -2120 ml       Physical exam  GEN APPEARANCE: Normal;  A&OX3  HEENT: PERLA; EOMI; Conjunctiva Clear  NECK: Supple; No bruits  CVS: RRR, S1, S2; No M/G/R  LUNGS: CTAB; No Wheezes; No Rhonchi: No rales  ABD: Soft, nondistended, nontender.  Upper abdomen wound dressing without bleeding or discharge  EXT: No edema; Pulses 2+ and intact  Skin exam:  pink  NEURO: CN 2-12 intact; No Focal neurological deficits  CAP REFILL:  Normal  MENTAL STATUS:  Normal    Exam done by Dillard Essex, MD on 07/09/19 at 9:02 AM      LABS     Recent Labs   Lab 07/09/19  0353 07/08/19  0357 07/07/19  0426   WBC 12.33* 14.31* 9.07   RBC 2.30* 2.54* 2.58*   Hgb 6.3* 7.0* 7.1*   Hematocrit 21.6* 24.0* 24.4*   MCV 93.9 94.5 94.6   Platelets 146 150 125*       Recent Labs   Lab 07/09/19  0353 07/08/19  0357 07/07/19  0426 07/06/19  0332 07/05/19  0514   Sodium 137 137 138 138 138   Potassium 4.2 4.7 4.5 4.0 4.6   Chloride 101 102 102 102 100   CO2 26 23 24 25 24    BUN 28.0* 53.0* 42.0* 27.0* 54.0*   Creatinine 4.1* 6.1* 5.2* 3.9* 5.6*   Glucose 170* 149* 180* 227* 217*   Calcium 7.9* 8.1* 8.0* 8.0* 8.1*   Magnesium 1.7 1.7 1.8 1.7 1.9       Recent Labs   Lab 07/09/19  0353 07/08/19  0357 07/07/19  0426   Albumin 2.2* 2.3* 2.4*                   Microbiology Results     Procedure Component Value Units Date/Time    COVID-19 (SARS-COV-2) [facility]) [[id number]] Collected: 06/30/19 2256    Specimen: Nasopharyngeal Swab from Nasopharynx Updated: 06/30/19 2341     Purpose of COVID testing Screening     SARS-CoV-2 Specimen Source Nasopharyngeal     SARS  CoV 2 Overall Result Negative     Comment: Test performed using the Abbott ID NOW EUA assay.  Please see Fact Sheets for patients and providers located at:  http://olson-hall.info/  This test is for the qualitative detection of SARS-CoV-2  (COVID19) nucleic acid. Viral nucleic acids may persist in vivo,  independent of viability. Detection of viral nucleic acid does  not imply the presence of infectious virus, or that virus  nucleic acid is the cause of clinical symptoms. Negative  results should be treated as presumptive and, if inconsistent  with clinical signs and symptoms or necessary for patient  management, should be tested with an alternative molecular  assay. Negative results do not preclude SARS-CoV-2 infection  and should not be used as the sole basis for patient  management decisions. Invalid results may be due to inhibiting  substances in the specimen and recollection should occur.  Narrative:      Screening    CULTURE BLOOD AEROBIC AND ANAEROBIC [[id number]] Collected: 07/01/19 2340    Specimen: Blood, Venipuncture Updated: 07/07/19 0721    Narrative:      ORDER#: [id number]                                    ORDERED BY: Genia Harold, LUBNA  SOURCE: Blood, Venipuncture                          COLLECTED:  07/01/19 23:40  ANTIBIOTICS AT COLL.:                                RECEIVED :  07/02/19 04:54  Culture Blood Aerobic and Anaerobic        FINAL       07/07/19 07:21  07/07/19   No growth after 5 days of incubation.      CULTURE BLOOD AEROBIC AND ANAEROBIC [[id number]] Collected: 07/01/19 2340    Specimen: Blood, Venipuncture Updated: 07/07/19 0721    Narrative:      ORDER#: [id number]                                    ORDERED BY: Genia Harold, LUBNA  SOURCE: Blood, Venipuncture                          COLLECTED:  07/01/19 23:40  ANTIBIOTICS AT COLL.:                                RECEIVED :  07/02/19 04:54  Culture Blood Aerobic and Anaerobic        FINAL       07/07/19 07:21  07/07/19   No  growth after 5 days of incubation.      Culture + Gram Stain,Aerobic, Wound [[id number]] Collected: 07/06/19 1826    Specimen: Wound from Abscess Updated: 07/09/19 0850    Narrative:      ORDER#: [id number]                                    ORDERED BY: Rebecka Apley  SOURCE: Abscess groin                                COLLECTED:  07/06/19 18:26  ANTIBIOTICS AT COLL.:                                RECEIVED :  07/06/19 22:59  Stain, Gram                                FINAL       07/07/19 02:32  07/07/19   Few WBCs             No organisms seen  Culture and Gram Stain, Aerobic, Wound     PRELIM      07/09/19 08:50   +  07/09/19   Heavy growth of Staphylococcus aureus               Susceptibility to follow        Culture + Gram Stain,Aerobic, Wound [[id number]] Collected: 07/02/19 0009    Specimen: Wound from Discharge Updated: 07/04/19 0934    Narrative:      ORDER#: [id number]                                    ORDERED BY: Sheppard Coil  SOURCE: Discharge abdominal wound                    COLLECTED:  07/02/19 00:09  ANTIBIOTICS AT COLL.:                                RECEIVED :  07/02/19 04:57  Stain, Gram                                FINAL       07/02/19 06:02  07/02/19   Moderate WBCs             Moderate Gram positive cocci             Moderate Gram negative rods             No Squamous epithelial cells seen  Culture and Gram Stain, Aerobic, Wound     FINAL       07/04/19 09:34   +  07/04/19   Heavy growth of Pseudomonas aeruginosa      07/04/19   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      _____________________________________________________________________________                                  P.aeruginosa        MRSA        ANTIBIOTICS                     MIC  INTRP      MIC  INTRP      _____________________________________________________________________________  Amikacin                        <=8    S                        Aztreonam                        8     S                         Cefepime                         4     S                        Ceftazidime                      4     S  Ciprofloxacin                 <=0.25   S                        Clindamycin                                    <=0.5   S        Daptomycin                                      <=1    S        Erythromycin                                    >4     R        Gentamicin                       4     S        <=2    S        Levofloxacin                   <=0.5   S        >4     R        Linezolid                                       <=1    S        Meropenem                      <=0.5   S                        Oxacillin                                       >2     R  D1    Penicillin                                      >1     R        Piperacillin/Tazobactam         8/4    S                        Rifampin                                       <=0.5   S        Tetracycline                                   <=  0.5   S        Tobramycin                      <=2    S                        Trimethoprim/Sulfamethoxazole                 <=0.5/9  S        Vancomycin                                       1     S          -----DRUG COMMENTS----------    D1:  Oxacillin sensitivity predicts sensitivity to cephalosporins.          Antimicrobial Subcommittee 2016  _____________________________________________________________________________            S=SUSCEPTIBLE     I=INTERMEDIATE     R=RESISTANT                            N/S=NON-SUSCEPTIBLE  _____________________________________________________________________________             Tunneled Cath Check/Change (Permcath)    Result Date: 07/07/2019   Successful right internal jugular tunneled dialysis catheter exchange Nelma Rothman, MD  07/03/2019 2:21 PM        Echo Results     None          RADIOLOGY     Upon my review:    Tommie Raymond  9:02 AM 07/09/2019

## 2019-07-09 NOTE — Progress Notes (Signed)
[facility] PROGRESS NOTE  Aaron Edelman, x [phone] [facility] Spectralink)      Date Time: 07/09/19 2:16 PM  Patient Name: [name]  Attending Physician: Dillard Essex, MD    CC: follow-up CKD    Assessment:     1. ESRD- on HD at [facility]  2. Volume overload- mild  3. HTN  4. Anemia- Hb down to 6.8  5. Secondary HPT  6. Abdominal surgical wound- not healing, MRSA and Pseudomonas  7. Recurrent falls      Recommendations:     1. HD in am UF 3-4 Liters; HD q TTS  2. Transfuse prbc with HD  3. Labs in am   4. Antibiotics and wopund care  5. Stable from renal standpoint       Case discussed with: pt, HD RN    Ulyses Amor, MD  [facility]  [phone] (office)  X [phone] [facility] Spectra-Link)      Subjective: c/o pain in abdominal LLQ wound    Review of Systems:   Review of Systems - No CP, SOB, dizziness    Physical Exam:     Vitals:    07/09/19 0350 07/09/19 0717 07/09/19 0719 07/09/19 1133   BP: 103/62  109/69 109/60   Pulse: 73 73 72 71   Resp: 16  18    Temp: 98.4 F (36.9 C)  98.8 F (37.1 C)    TempSrc: Oral  Oral    SpO2: 94% 94% 93%    Weight:       Height:           Intake and Output Summary (Last 24 hours) at Date Time    Intake/Output Summary (Last 24 hours) at 07/09/2019 1416  Last data filed at 07/09/2019 0630  Gross per 24 hour   Intake 530 ml   Output 2650 ml   Net -2120 ml     General: awake, alert, oriented x 3, no acute distress.  Cardiovascular: regular rate and rhythm  Lungs: clear to auscultation bilaterally, bilateral air entry, normal work of breathing  Abdomen: soft; LLQ wound bandaged  Extremities: trace edema  Other: RIJ Permcath    Meds:      Scheduled Meds: PRN Meds:    acetaminophen, 1,000 mg, Oral, TID  amLODIPine, 5 mg, Oral, Q12H  atorvastatin, 40 mg, Oral, Daily  carvedilol, 25 mg, Oral, Q12H  collagenase, , Topical, Q24H  darbepoetin alfa, 0.45 mcg/kg, Subcutaneous, Weekly  gabapentin, 300 mg, Oral, Daily  heparin (porcine), 5,000 Units,  Subcutaneous, Q8H SCH  insulin glargine, 30 Units, Subcutaneous, QAM  insulin lispro, 1-3 Units, Subcutaneous, QHS  insulin lispro, 1-5 Units, Subcutaneous, TID AC  levoFLOXacin, 500 mg, Oral, Q48H  miconazole 2 % with zinc oxide, , Topical, Q6H  nystatin, 1 application, Topical, BID  pantoprazole, 40 mg, Oral, BID  sevelamer, 800 mg, Oral, TID MEALS  sodium hypochlorite, , Irrigation, Daily  vancomycin, , Intravenous, See Admin Instructions  vitamin C, 500 mg, Oral, Daily  zinc sulfate, 220 mg, Oral, Daily          Continuous Infusions:   dextrose, 15 g of glucose, PRN   And  dextrose, 12.5 g, PRN   And  glucagon (rDNA), 1 mg, PRN  melatonin, 3 mg, QHS PRN  naloxone, 0.2 mg, PRN  ondansetron, 4 mg, Q6H PRN   Or  ondansetron, 4 mg, Q6H PRN  oxyCODONE, 5 mg, Q6H PRN  Labs:     Recent Labs   Lab 07/09/19  1246 07/09/19  0353 07/08/19  0357 07/07/19  0426 07/07/19  0426   WBC  --  12.33* 14.31*  --  9.07   Hgb 6.8* 6.3* 7.0*  More results in Results Review 7.1*   Hematocrit 23.0* 21.6* 24.0*  More results in Results Review 24.4*   Platelets  --  146 150  --  125*   More results in Results Review = values in this interval not displayed.     Recent Labs   Lab 07/09/19  0353 07/08/19  0357 07/07/19  0426   Sodium 137 137 138   Potassium 4.2 4.7 4.5   Chloride 101 102 102   CO2 26 23 24    BUN 28.0* 53.0* 42.0*   Creatinine 4.1* 6.1* 5.2*   Calcium 7.9* 8.1* 8.0*   Albumin 2.2* 2.3* 2.4*   Phosphorus 3.3 3.8 4.1   Magnesium 1.7 1.7 1.8   Glucose 170* 149* 180*   EGFR 13.2 8.4 10.1             Invalid input(s): LEUKOCYTESUR              Signed by: Ulyses Amor, MD

## 2019-07-10 LAB — CROSSMATCH PRBC, 1 UNIT
Expiration Date: 202107072359
ISBT CODE: 8400
Status: TRANSFUSED
UTYPE: AB POS

## 2019-07-10 LAB — CBC
Absolute NRBC: 0 10*3/uL (ref 0.00–0.00)
Hematocrit: 24.3 % — ABNORMAL LOW (ref 34.7–43.7)
Hgb: 7.4 g/dL — ABNORMAL LOW (ref 11.4–14.8)
MCH: 28.1 pg (ref 25.1–33.5)
MCHC: 30.5 g/dL — ABNORMAL LOW (ref 31.5–35.8)
MCV: 92.4 fL (ref 78.0–96.0)
MPV: 11.1 fL (ref 8.9–12.5)
Nucleated RBC: 0 /100 WBC (ref 0.0–0.0)
Platelets: 160 10*3/uL (ref 142–346)
RBC: 2.63 10*6/uL — ABNORMAL LOW (ref 3.90–5.10)
RDW: 14 % (ref 11–15)
WBC: 11.39 10*3/uL — ABNORMAL HIGH (ref 3.10–9.50)

## 2019-07-10 LAB — RENAL FUNCTION PANEL
Albumin: 2.2 g/dL — ABNORMAL LOW (ref 3.5–5.0)
Anion Gap: 12 (ref 5.0–15.0)
BUN: 45 mg/dL — ABNORMAL HIGH (ref 7.0–19.0)
CO2: 24 mEq/L (ref 22–29)
Calcium: 8.4 mg/dL — ABNORMAL LOW (ref 8.5–10.5)
Chloride: 100 mEq/L (ref 100–111)
Creatinine: 5.8 mg/dL — ABNORMAL HIGH (ref 0.6–1.0)
Glucose: 256 mg/dL — ABNORMAL HIGH (ref 70–100)
Phosphorus: 4.2 mg/dL (ref 2.3–4.7)
Potassium: 4.5 mEq/L (ref 3.5–5.1)
Sodium: 136 mEq/L (ref 136–145)

## 2019-07-10 LAB — HEPATITIS B SURFACE ANTIGEN W/ REFLEX TO CONFIRMATION: Hepatitis B Surface Antigen: NONREACTIVE

## 2019-07-10 LAB — VANCOMYCIN, RANDOM: Vancomycin Random: 17.8 ug/mL

## 2019-07-10 LAB — MAGNESIUM: Magnesium: 1.8 mg/dL (ref 1.6–2.6)

## 2019-07-10 LAB — GLUCOSE WHOLE BLOOD - POCT
Whole Blood Glucose POCT: 214 mg/dL — ABNORMAL HIGH (ref 70–100)
Whole Blood Glucose POCT: 179 mg/dL — ABNORMAL HIGH (ref 70–100)
Whole Blood Glucose POCT: 222 mg/dL — ABNORMAL HIGH (ref 70–100)
Whole Blood Glucose POCT: 232 mg/dL — ABNORMAL HIGH (ref 70–100)

## 2019-07-10 LAB — GFR: EGFR: 8.9

## 2019-07-10 LAB — HEMOLYSIS INDEX
Hemolysis Index: 0 (ref 0–18)
Hemolysis Index: 0 (ref 0–18)

## 2019-07-10 MED ORDER — SODIUM CHLORIDE 0.9 % IV SOLN
750.00 mg | Freq: Once | INTRAVENOUS | Status: AC
Start: 2019-07-10 — End: 2019-07-10
  Administered 2019-07-10: 19:00:00 750 mg via INTRAVENOUS
  Filled 2019-07-10: qty 750

## 2019-07-10 MED ORDER — SODIUM CHLORIDE 0.9 % IV BOLUS
250.0000 mL | INTRAVENOUS | Status: AC | PRN
Start: 2019-07-10 — End: 2019-07-10

## 2019-07-10 MED ORDER — ALBUMIN HUMAN 25 % IV SOLN
100.0000 mL | INTRAVENOUS | Status: AC | PRN
Start: 2019-07-10 — End: 2019-07-10
  Filled 2019-07-10: qty 100

## 2019-07-10 MED ORDER — ALBUMIN HUMAN 25 % IV SOLN
INTRAVENOUS | Status: AC
Start: 2019-07-10 — End: 2019-07-10
  Administered 2019-07-10: 10:00:00 100 mL via INTRAVENOUS
  Filled 2019-07-10: qty 100

## 2019-07-10 MED ORDER — SODIUM CHLORIDE 0.9 % IV BOLUS
100.0000 mL | INTRAVENOUS | Status: AC | PRN
Start: 2019-07-10 — End: 2019-07-10

## 2019-07-10 NOTE — Progress Notes (Signed)
INFECTIOUS DISEASES PROGRESS NOTE    Date Time: 07/10/19 3:37 PM  Patient Name: [name]  Patient status.Inpatient  Hospital Day: 8    Assessment and Plan:   1.  Abdominal wall abscess: MRSA and Pseudomonas  2.  Yeast infection      Plan: 1.  Vancomycin for MRSA, Levaquin for Pseudomonas  2.   nystatin and fluconazole for.  Inguinal yeast infection  3. Patient can go home with wound care  Suggest outpatient follow-up with plastics  Suggest discharge on 5 days of p.o. Cipro 250 mg daily, doxycycline 100 mg p.o. twice daily and 50 of fluconazole for 5 days  Subjective:   Nontoxic    Review of Systems:   Review of Systems -anxiety    Antibiotics:   Day 5    Other medications reviewed in EPIC.  Central Access:       Physical Exam:   T-max 99  Vitals:    07/10/19 1404   BP: 135/78   Pulse: 67   Resp:    Temp:    SpO2:      Abdominal wall: Cavity is clean.  No drainage                             Yellow rim      Labs:     Microbiology Results     Procedure Component Value Units Date/Time    COVID-19 (SARS-COV-2) [employer]) [[id number]] Collected: 06/30/19 2256    Specimen: Nasopharyngeal Swab from Nasopharynx Updated: 06/30/19 2341     Purpose of COVID testing Screening     SARS-CoV-2 Specimen Source Nasopharyngeal     SARS CoV 2 Overall Result Negative     Comment: Test performed using the Abbott ID NOW EUA assay.  Please see Fact Sheets for patients and providers located at:  http://olson-hall.info/  This test is for the qualitative detection of SARS-CoV-2  (COVID19) nucleic acid. Viral nucleic acids may persist in vivo,  independent of viability. Detection of viral nucleic acid does  not imply the presence of infectious virus, or that virus  nucleic acid is the cause of clinical symptoms. Negative  results should be treated as presumptive and, if inconsistent  with clinical signs and symptoms or necessary for patient  management, should be tested with an alternative molecular  assay. Negative  results do not preclude SARS-CoV-2 infection  and should not be used as the sole basis for patient  management decisions. Invalid results may be due to inhibiting  substances in the specimen and recollection should occur.         Narrative:      Screening    CULTURE BLOOD AEROBIC AND ANAEROBIC [[id number]] Collected: 07/01/19 2340    Specimen: Blood, Venipuncture Updated: 07/07/19 0721    Narrative:      ORDER#: [id number]                                    ORDERED BY: Sheppard Coil  SOURCE: Blood, Venipuncture                          COLLECTED:  07/01/19 23:40  ANTIBIOTICS AT COLL.:  RECEIVED :  07/02/19 04:54  Culture Blood Aerobic and Anaerobic        FINAL       07/07/19 07:21  07/07/19   No growth after 5 days of incubation.      CULTURE BLOOD AEROBIC AND ANAEROBIC [[id number]] Collected: 07/01/19 2340    Specimen: Blood, Venipuncture Updated: 07/07/19 0721    Narrative:      ORDER#: [id number]                                    ORDERED BY: Genia Harold, LUBNA  SOURCE: Blood, Venipuncture                          COLLECTED:  07/01/19 23:40  ANTIBIOTICS AT COLL.:                                RECEIVED :  07/02/19 04:54  Culture Blood Aerobic and Anaerobic        FINAL       07/07/19 07:21  07/07/19   No growth after 5 days of incubation.      Culture + Gram Stain,Aerobic, Wound [[id number]] Collected: 07/06/19 1826    Specimen: Wound from Abscess Updated: 07/07/19 0232    Narrative:      ORDER#: [id number]                                    ORDERED BY: Rebecka Apley  SOURCE: Abscess groin                                COLLECTED:  07/06/19 18:26  ANTIBIOTICS AT COLL.:                                RECEIVED :  07/06/19 22:59  Stain, Gram                                FINAL       07/07/19 02:32  07/07/19   Few WBCs             No organisms seen  Culture and Gram Stain, Aerobic, Wound     PENDING      Culture + Gram Stain,Aerobic, Wound [[id number]] Collected: 07/02/19 0009    Specimen: Wound  from Discharge Updated: 07/04/19 0934    Narrative:      ORDER#: [id number]                                    ORDERED BY: Sheppard Coil  SOURCE: Discharge abdominal wound                    COLLECTED:  07/02/19 00:09  ANTIBIOTICS AT COLL.:                                RECEIVED :  07/02/19 04:57  Stain, Gram  FINAL       07/02/19 06:02  07/02/19   Moderate WBCs             Moderate Gram positive cocci             Moderate Gram negative rods             No Squamous epithelial cells seen  Culture and Gram Stain, Aerobic, Wound     FINAL       07/04/19 09:34   +  07/04/19   Heavy growth of Pseudomonas aeruginosa      07/04/19   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      _____________________________________________________________________________                                  P.aeruginosa        MRSA        ANTIBIOTICS                     MIC  INTRP      MIC  INTRP      _____________________________________________________________________________  Amikacin                        <=8    S                        Aztreonam                        8     S                        Cefepime                         4     S                        Ceftazidime                      4     S                        Ciprofloxacin                 <=0.25   S                        Clindamycin                                    <=0.5   S        Daptomycin                                      <=1    S        Erythromycin                                    >4     R  Gentamicin                       4     S        <=2    S        Levofloxacin                   <=0.5   S        >4     R        Linezolid                                       <=1    S        Meropenem                      <=0.5   S                        Oxacillin                                       >2     R  D1    Penicillin                                      >1     R        Piperacillin/Tazobactam         8/4    S                         Rifampin                                       <=0.5   S        Tetracycline                                   <=0.5   S        Tobramycin                      <=2    S                        Trimethoprim/Sulfamethoxazole                 <=0.5/9  S        Vancomycin                                       1     S          -----DRUG COMMENTS----------    D1:  Oxacillin sensitivity predicts sensitivity to cephalosporins.         [place] Ant[employer]committee 2016  _____________________________________________________________________________            S=SUSCEPTIBLE  I=INTERMEDIATE     R=RESISTANT                            N/S=NON-SUSCEPTIBLE  _____________________________________________________________________________            CBC w/Diff CMP   Recent Labs   Lab 07/10/19  0624 07/09/19  1246 07/09/19  0353 07/08/19  0357 07/08/19  0357   WBC 11.39*  --  12.33*  --  14.31*   Hgb 7.4* 6.8* 6.3*  More results in Results Review 7.0*   Hematocrit 24.3* 23.0* 21.6*  More results in Results Review 24.0*   Platelets 160  --  146  --  150   MCV 92.4  --  93.9  --  94.5   More results in Results Review = values in this interval not displayed.       PT/INR         Recent Labs   Lab 07/10/19  0624 07/09/19  0353 07/08/19  0357   Sodium 136 137 137   Potassium 4.5 4.2 4.7   Chloride 100 101 102   CO2 24 26 23    BUN 45.0* 28.0* 53.0*   Creatinine 5.8* 4.1* 6.1*   Glucose 256* 170* 149*   Calcium 8.4* 7.9* 8.1*   Magnesium 1.8 1.7 1.7   Phosphorus 4.2 3.3 3.8   Albumin 2.2* 2.2* 2.3*      Glucose POCT   Recent Labs   Lab 07/10/19  0624 07/09/19  0353 07/08/19  0357 07/07/19  0426 07/06/19  0332 07/05/19  0514 07/04/19  0419   Glucose 256* 170* 149* 180* 227* 217* 169*          Rads:     Radiology Results (24 Hour)     ** No results found for the last 24 hours. **            Signed by: Ernst Breach

## 2019-07-10 NOTE — UM Notes (Addendum)
Primary Coverage: MEDICAID HMO/UHC COMMUNITY PLAN [place] PLUS  RECONSIDERATION    CONCURRENT REVIEW July 10, 2019      PATIENT NAME: [name],[name]  DOB: [birth year]    History of present illness: Pt is a 64 y.o. female arrived at [facility] on 06/30/2019 at 2225.and admitted to 23S  Pt is a 64yo F here with traumatic injury to abdominal wall with development of abscess; MRSA/Pseudomonas.  Had malfunction of HD catheter s/p replacement.  RRT on 6/8-9 for:  # Acute encephalopathy: DDx: Infection vs central etiology with repeated fall/? hitting head vs renal failure. Glucose 309. RN expressed her concern for possible substance abuse.   # Fever: 100.2     Receiving IV vanco after HD for concern for wound infection.    BP 138/81    Pulse 70    Temp 98.2 F (36.8 C) (Oral)    Resp 16    Ht 1.575 m (5\' 2" )    Wt 110.1 kg (242 lb 11.6 oz)    LMP  (LMP Unknown) Comment: post menopausal   SpO2 98%    BMI 44.40 kg/m     Temp:  [97.6 F (36.4 C)-98.6 F (37 C)]   Heart Rate:  [65-74]   Resp Rate:  [16-20]   BP: (70-138)/(45-81)   SpO2:  [94 %-100 %]         Abnormal Labs:   Lab Results last 48 Hours     Procedure Component Value Units Date/Time    Rectal Swab Carbapenemase Surveillance PCR [[id number]] Collected: 07/10/19 1759    Specimen: Rectal Swab Updated: 07/10/19 2144    Glucose Whole Blood - POCT [[id number]]  (Abnormal) Collected: 07/10/19 2052     Updated: 07/10/19 2059     Whole Blood Glucose POCT 232 mg/dL     Glucose Whole Blood - POCT [[id number]]  (Abnormal) Collected: 07/10/19 1648     Updated: 07/10/19 1656     Whole Blood Glucose POCT 222 mg/dL     Hemolysis index [[id number]] Collected: 07/10/19 0624     Updated: 07/10/19 1452     Hemolysis Index 0    Glucose Whole Blood - POCT [[id number]]  (Abnormal) Collected: 07/10/19 1207     Updated: 07/10/19 1214     Whole Blood Glucose POCT 179 mg/dL     Culture + Gram Stain,Aerobic, Wound [[id number]] Collected: 07/06/19 1826    Specimen: Wound from Abscess Updated: 07/10/19  0959    Narrative:      ORDER#: [id number]                                    ORDERED BY: Rebecka Apley  SOURCE: Abscess groin                                COLLECTED:  07/06/19 18:26  ANTIBIOTICS AT COLL.:                                RECEIVED :  07/06/19 22:59  Stain, Gram                                FINAL       07/07/19 02:32  07/07/19   Few WBCs  No organisms seen  Culture and Gram Stain, Aerobic, Wound     FINAL       07/10/19 09:59   +  07/09/19   Light growth of mixed cutaneous flora  07/09/19   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      07/10/19   Light growth of Enterococcus faecalis      _____________________________________________________________________________                                      MRSA         E.faecalis     ANTIBIOTICS                     MIC  INTRP      MIC  INTRP      _____________________________________________________________________________  Ampicillin                                       1     S        Clindamycin                    <=0.5   S                        Daptomycin                      <=1    S                        Erythromycin                    >4     R                        Gentamicin                      <=2    S                        Gentamicin High Level Resistan                 <=500   S        Levofloxacin                    >4     R                        Linezolid                        2     S                        Oxacillin                       >2     R  D1                    Penicillin                      >  1     R         4     S        Rifampin                       <=0.5   S                        Streptomycin High Level Resist                 >1000   R        Tetracycline                   <=0.5   S                        Trimethoprim/Sulfamethoxazole <=0.5/9  S                        Vancomycin                       1     S         1     S          -----DRUG COMMENTS----------    D1:  Oxacillin sensitivity  predicts sensitivity to cephalosporins.         [place] Antimicrobial [employer]committee 2016  _____________________________________________________________________________            S=SUSCEPTIBLE     I=INTERMEDIATE     R=RESISTANT                            N/S=NON-SUSCEPTIBLE  _____________________________________________________________________________      Glucose Whole Blood - POCT [[id number]]  (Abnormal) Collected: 07/10/19 0824     Updated: 07/10/19 0830     Whole Blood Glucose POCT 214 mg/dL     Vancomycin, random [[id number]] Collected: 07/10/19 0624    Specimen: Blood Updated: 07/10/19 0700     Vancomycin Random 17.8 ug/mL      Vancomycin Time of Last Dose ,     Vancomycin Date of Last Dose 07/06/2019    GFR [[id number]] Collected: 07/10/19 0624     Updated: 07/10/19 0654     EGFR 8.9    Renal function panel [[id number]]  (Abnormal) Collected: 07/10/19 0624    Specimen: Blood Updated: 07/10/19 0654     Glucose 256 mg/dL      BUN 45.0 mg/dL      Calcium 8.4 mg/dL      Creatinine 5.8 mg/dL      Albumin 2.2 g/dL     Hemolysis index [[id number]] Collected: 07/10/19 0624     Updated: 07/10/19 0654     Hemolysis Index 0    CBC without differential [[id number]]  (Abnormal) Collected: 07/10/19 0624    Specimen: Blood Updated: 07/10/19 0635     WBC 11.39 x10 3/uL      Hgb 7.4 g/dL      Hematocrit 24.3 %      Platelets 160 x10 3/uL      RBC 2.63 x10 6/uL      MCHC 30.5 g/dL     Glucose Whole Blood - POCT [[id number]]  (Abnormal) Collected: 07/09/19 2221     Updated: 07/09/19 2223     Whole Blood Glucose POCT 222 mg/dL  Glucose Whole Blood - POCT [[id number]]  (Abnormal) Collected: 07/09/19 1736     Updated: 07/09/19 1809     Whole Blood Glucose POCT 271 mg/dL     Glucose Whole Blood - POCT [[id number]]  (Abnormal) Collected: 07/09/19 1401     Updated: 07/09/19 1406     Whole Blood Glucose POCT 204 mg/dL     Procalcitonin [[id number]]  (Abnormal) Collected: 07/09/19 0353     Updated: 07/09/19 1352     Procalcitonin 0.83     Hemoglobin and hematocrit, blood [[id number]]  (Abnormal) Collected: 07/09/19 1246    Specimen: Blood Updated: 07/09/19 1259     Hgb 6.8 g/dL      Hematocrit 23.0 %     Glucose Whole Blood - POCT [[id number]]  (Abnormal) Collected: 07/09/19 0720     Updated: 07/09/19 0742     Whole Blood Glucose POCT 200 mg/dL     Hemolysis index [[id number]]  (Abnormal) Collected: 07/09/19 0353     Updated: 07/09/19 0452     Hemolysis Index -2    GFR [[id number]] Collected: 07/09/19 0353     Updated: 07/09/19 0452     EGFR 13.2    Magnesium [[id number]] Collected: 07/09/19 0353    Specimen: Blood Updated: 07/09/19 0452     Magnesium 1.7 mg/dL     Renal function panel [[id number]]  (Abnormal) Collected: 07/09/19 0353    Specimen: Blood Updated: 07/09/19 0452     Glucose 170 mg/dL      BUN 28.0 mg/dL      Calcium 7.9 mg/dL      Creatinine 4.1 mg/dL      Albumin 2.2 g/dL     CBC without differential [[id number]]  (Abnormal) Collected: 07/09/19 0353    Specimen: Blood Updated: 07/09/19 0435     WBC 12.33 x10 3/uL      Hgb 6.3 g/dL      Hematocrit 21.6 %      Platelets 146 x10 3/uL      RBC 2.30 x10 6/uL      MCHC 29.2 g/dL         MD Notes:  ID 07/10/2019  Assessment and Plan:  1.  Abdominal wall abscess: MRSA and Pseudomonas  2.  Yeast infection  Plan:   1.  Vancomycin for MRSA, Levaquin for Pseudomonas  2.   nystatin and fluconazole for.  Inguinal yeast infection  3. Patient can go home with wound care  Suggest outpatient follow-up with plastics  Suggest discharge on 5 days of p.o. Cipro 250 mg daily, doxycycline 100 mg p.o. twice daily and 50 of fluconazole for 5 days    MD note 07/10/2019  Patient has been admitted with febrile illness, confusion and proneness to falls.  Patient was found to have abdominal wall cellulitis/wound.  Cultures turned positive methicillin resistant Staphylococcus aureus and Pseudomonas. aeruginosa.  Patient has been treated with combination of IV vancomycin in levofloxacin.  ID note and recommendations  appreciated.  Patient also had been evaluated by plastic surgery.Patient's lab shows anemia, hemoglobin low at 6.4 repeated hemoglobin 6.8.  Patient is on dialysis.  Considering patient's progress to falls and complaint of dizziness.  Will give 1 unit packed RBCs transfusion.  Consent obtained.  We will proceed with giving 1 unit packed RBCs transfusion now however if further transfusion is needed will be given during dialysis.  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 Hgblow 7today. Iron profile suggestive of AOCD  Transient alteration in mental status - mental status back at baseline  Essential hypertension continue amlodipine  Hyperlipidemia continue atorvastatin  Neuropathy, NOS  DM2, continue basal insulin glargine 30units daily and continue slight scale coverage with lispro  Inguinal yeast infection  Continue IV vancomycin and oral Levaquin  Inguinal fungal dermatitis, started on fluconazole  Consultants, ID and plastic surgery notes and recommendations appreciated  PT/OT evaluation ono 6/8 recommending SNF; patient is reluctant; Asked PT to re-evulate  Pain management: Continue as needed acetaminophen and if needed as needed oxycodone.  Continue antacid suppression treatment with Protonix  Analgesia:Acetaminophen, oxycodone  Nutrition: Renal  DVT Prophylaxis: Heparin  Code Status: DNR  DISPO:SNF once medically stable          Medications: Scheduled Meds:  Current Facility-Administered Medications   Medication Dose Route Frequency    acetaminophen  1,000 mg Oral TID    amLODIPine  5 mg Oral Q12H    atorvastatin  40 mg Oral Daily    carvedilol  25 mg Oral Q12H    collagenase   Topical Q24H    darbepoetin alfa  0.45 mcg/kg Subcutaneous Weekly    gabapentin  300 mg Oral Daily    heparin (porcine)  5,000 Units Subcutaneous Q8H Samoset    insulin glargine  30 Units  Subcutaneous QAM    insulin lispro  1-3 Units Subcutaneous QHS    insulin lispro  1-5 Units Subcutaneous TID AC    miconazole 2 % with zinc oxide   Topical Q6H    nystatin  1 application Topical BID    pantoprazole  40 mg Oral BID    sevelamer  800 mg Oral TID MEALS    sodium hypochlorite   Irrigation Daily    vancomycin   Intravenous See Admin Instructions    vitamin C  500 mg Oral Daily    zinc sulfate  220 mg Oral Daily     Continuous Infusions:  PRN Meds:.sodium chloride, Nursing communication: Adult Hypoglycemia Treatment Algorithm **AND** dextrose **AND** dextrose **AND** glucagon (rDNA), melatonin, naloxone, ondansetron **OR** ondansetron, oxyCODONE      This clinical review is based on/compiled from documentation provided by the treatment team within the patient's medical record.    UTILIZATION REVIEW CONTACT : Shanon Payor BSN, RN, ACM  Utilization Review Case Manager II / Case Management  [facility]  [address]  [place], [place]  T [phone]  Jerilynn Mages [phone]   F [phone]    Email: [email or web address]  NPI: [id number] Tax ID:  [id number]

## 2019-07-10 NOTE — Progress Notes (Signed)
Progress Note- Pharmacy Vancomycin Dosing Consult:  Day 7  Vancomycin Indication: Wound infection   Vancomycin Target Trough Level: Pre-HD level of 15-25 mcg/mL    Pertinent Cultures:    Date Source  Organism & Pertinent Susceptibilities     6/8 Blood cx x2 NG x 5 days    6/9 Wound cx Heavy growth of Pseudomonas aeruginosa   Heavy growth of MRSA   6/13 Wound cx (repeated) Few WBCs   No organisms seen   Final results pending         As appropriate, contact physician to consider change in therapy if cultures grow organism other than MRSA     98.1 F (36.7 C) (Oral)   110.1 kg (242 lb 11.6 oz)  Recent Labs   Lab 07/10/19  0624 07/09/19  0353 07/08/19  0357 07/07/19  0426 07/06/19  0332 07/05/19  0514 07/04/19  0419   WBC 11.39* 12.33* 14.31* 9.07 8.75 9.13 8.85   Creatinine 5.8* 4.1* 6.1* 5.2* 3.9* 5.6* 4.1*   Vancomycin Random 17.8  --   --  29.5  --  16.0  --      Serum creatinine: 5.8 mg/dL (H) 07/10/19 2505  Estimated creatinine clearance: 11.5 mL/min (A)       Baseline SCr: ESRD on HD Tues, Thurs, Sat   Nephrotoxic drugs administered in the past 48 hours: None    Assessment  Measured Vancomycin Level: Pre-HD random level = 17.8, therapeutic      Recommendations:  Vanco 750mg  IV after HD today  New Vancomycin level: Pre-HD random level ordered for Sat, 6/19 with AM labs.

## 2019-07-10 NOTE — Plan of Care (Signed)
Problem: Pain  Goal: Pain at adequate level as identified by patient  Outcome: Progressing  Flowsheets (Taken 07/10/2019 0820 by Ezequiel Essex, RN)  Pain at adequate level as identified by patient:   Identify patient comfort function goal   Assess for risk of opioid induced respiratory depression, including snoring/sleep apnea. Alert healthcare team of risk factors identified.   Assess pain on admission, during daily assessment and/or before any "as needed" intervention(s)   Reassess pain within 30-60 minutes of any procedure/intervention, per Pain Assessment, Intervention, Reassessment (AIR) Cycle   Evaluate if patient comfort function goal is met   Evaluate patient's satisfaction with pain management progress     Problem: Safety  Goal: Patient will be free from injury during hospitalization  Outcome: Progressing  Flowsheets (Taken 07/09/2019 0704 by Ezequiel Essex, RN)  Patient will be free from injury during hospitalization:   Assess patient's risk for falls and implement fall prevention plan of care per policy   Use appropriate transfer methods   Ensure appropriate safety devices are available at the bedside   Include patient/ family/ care giver in decisions related to safety   Provide and maintain safe environment   Hourly rounding     Problem: Compromised Tissue integrity  Goal: Damaged tissue is healing and protected  Outcome: Progressing  Flowsheets (Taken 07/09/2019 0704 by Ezequiel Essex, RN)  Damaged tissue is healing and protected:   Monitor/assess Braden scale every shift   Reposition patient every 2 hours and as needed unless able to reposition self   Provide wound care per wound care algorithm   Increase activity as tolerated/progressive mobility   Avoid shearing injuries   Keep intact skin clean and dry   Use bath wipes, not soap and water, for daily bathing   Relieve pressure to bony prominences for patients at moderate and high risk     Problem: Nutrition  Goal: Nutritional intake is  adequate  Outcome: Progressing  Flowsheets (Taken 07/01/2019 1336 by Mickel Baas, RN)  Nutritional intake is adequate:   Monitor daily weights   Consult/collaborate with Clinical Nutritionist   Assess anorexia, appetite, and amount of meal/food tolerated   Include patient/patient care companion in decisions related to nutrition      Patient alert and oriented x4. Patient denies SOB, pain. Scheduled medications administered. Dialysis done today. Fall bundle in place. Hourly rounding done. Will continue to monitor.

## 2019-07-10 NOTE — Plan of Care (Signed)
Problem: Renal Instability  Goal: Fluid and electrolyte balance are achieved/maintained  Outcome: Progressing  Flowsheets (Taken 07/10/2019 1035)  Fluid and electrolyte balance are achieved/maintained:   Monitor/assess lab values and report abnormal values   Monitor daily weight  Goal: Free from infection  Outcome: Progressing  Flowsheets (Taken 07/10/2019 1035)  Free from infection: Monitor/assess for signs and symptoms of infection     Problem: Patient Receiving Advanced Renal Therapies  Goal: Therapy access site remains intact  Outcome: Progressing  Flowsheets (Taken 07/10/2019 1035)  Therapy access site remains intact:   Assess therapy access site   Change therapy access site dressing as needed

## 2019-07-10 NOTE — Progress Notes (Signed)
[facility]  PROGRESS NOTE      Patient: [name]  Date: 07/10/2019   LOS: 8 Days  Admission Date: 06/30/2019   MRN: [id number]  Attending: Dillard Essex  Please contact me via Secure Chat       ASSESSMENT/PLAN     [name] is a 64 y.o. female admitted with Dizziness    Interval Summary:     Active Hospital Problems    Diagnosis    Dizziness    Fall    Open wound, abdominal wall, lateral, subsequent encounter    Uncontrolled type 2 diabetes mellitus with hyperglycemia    Hypertension    Hyperlipidemia    History of COPD    History of anemia due to chronic kidney disease    ESRD (end stage renal disease) on dialysis      Chart reviewed, the patient has been admitted with febrile illness, confusion and proneness to falls.  Patient was found to have abdominal wall cellulitis/wound.  Cultures turned positive methicillin resistant Staphylococcus aureus and Pseudomonas. aeruginosa.  Patient has been treated with combination of IV vancomycin in levofloxacin.  ID note and recommendations appreciated.  Patient also had been evaluated by plastic surgery.Patient's lab shows anemia, hemoglobin low at 6.4 repeated hemoglobin 6.8.  Patient is on dialysis.  Considering patient's progress to falls and complaint of dizziness.  Will give 1 unit packed RBCs transfusion.  Consent obtained.  We will proceed with giving 1 unit packed RBCs transfusion now however if further transfusion is needed will be given during dialysis.    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 Hgb low 7 today. Iron profile suggestive of AOCD  Transient alteration in mental status - mental status back at baseline  Essential hypertension continue amlodipine  Hyperlipidemia continue atorvastatin  Neuropathy, NOS  DM2, continue basal insulin glargine 30units daily and continue slight scale coverage  with lispro  Inguinal yeast infection   Continue  IV vancomycin and oral Levaquin  Inguinal fungal dermatitis, started on fluconazole    Consultants, ID and plastic surgery notes and recommendations appreciated     PT/OT evaluation ono 6/8 recommending SNF; patient is reluctant; Asked PT to re-evulate  Pain management: Continue as needed acetaminophen and if needed as needed oxycodone.  Continue antacid suppression treatment with Protonix      Analgesia: Acetaminophen, oxycodone    Nutrition: Renal    DVT Prophylaxis: Heparin       Code Status: DNR    DISPO: SNF once medically stable    Family Contact: Daughter       Shreve states in pain, back of head, arm.  Wanting pain meds.    6/10-  Drowsy, no acute issues    6/11-concerned about her abdominal wound.  Discussed culture results.  Pain minimal.  No other issues.    6/12 - Patient complains of some pain in abdominal wound. No complaints of fever, chills, sob.  Tolerating diet.    6/13 - Patient complains of abdominal pain at wound site. She has no complaints of fever, chills  She had a BM this AM. She had OJ when BG was 64    6/14 -patient reports feeling okay she is frustrated about the abdominal wound smell.  She states that she will not go to skilled nursing facility as recommended  by therapy  Explained that we will get a reevaluation to see how she is doing  She complains of intermittent abdominal pain.  No complaints of nausea, vomiting.  She is tolerating diet    07/08/19- Patient states she is tired. She complains of abdominal pain.  She gets frustrated and states "she is not going anywhere until her wound completely heals" when attempting to talk about discharge planning soon once antibiotic recs are finalized and WBC improves        Analgesia: as needed analgesic    Nutrition: Cardiac diabetic diet    DVT Prophylaxis: Heparin products       Code Status: Full code    DISPO: PT OT recommends skilled nursing  facility    Family Contact:        Moody states patient complains of dizziness lightheadedness and progress to falls.  Patient with symptomatic anemia hemoglobin  under 7.  Denies fever or chills.  No shortness of breath today    MEDICATIONS     Current Facility-Administered Medications   Medication Dose Route Frequency    acetaminophen  1,000 mg Oral TID    amLODIPine  5 mg Oral Q12H    atorvastatin  40 mg Oral Daily    carvedilol  25 mg Oral Q12H    collagenase   Topical Q24H    darbepoetin alfa  0.45 mcg/kg Subcutaneous Weekly    gabapentin  300 mg Oral Daily    heparin (porcine)  5,000 Units Subcutaneous Q8H [place]    insulin glargine  30 Units Subcutaneous QAM    insulin lispro  1-3 Units Subcutaneous QHS    insulin lispro  1-5 Units Subcutaneous TID AC    levoFLOXacin  500 mg Oral Q48H    miconazole 2 % with zinc oxide   Topical Q6H    nystatin  1 application Topical BID    pantoprazole  40 mg Oral BID    sevelamer  800 mg Oral TID MEALS    sodium hypochlorite   Irrigation Daily    vancomycin  750 mg Intravenous Once    vancomycin   Intravenous See Admin Instructions    vitamin C  500 mg Oral Daily    zinc sulfate  220 mg Oral Daily       PHYSICAL EXAM     Vitals:    07/10/19 0822   BP: 105/68   Pulse: 68   Resp: 18   Temp: 98.1 F (36.7 C)   SpO2: 97%       Temperature: Temp  Min: 97.7 F (36.5 C)  Max: 99 F (37.2 C)  Pulse: Pulse  Min: 68  Max: 74  Respiratory: Resp  Min: 16  Max: 18  Non-Invasive BP: BP  Min: 104/67  Max: 128/77  Pulse Oximetry SpO2  Min: 94 %  Max: 97 %    Intake and Output Summary (Last 24 hours) at Date Time    Intake/Output Summary (Last 24 hours) at 07/10/2019 0916  Last data filed at 07/10/2019 0715  Gross per 24 hour   Intake 500 ml   Output 0 ml   Net 500 ml       Physical exam  GEN APPEARANCE: Normal;  A&OX3  HEENT: PERLA; EOMI; Conjunctiva Clear  NECK: Supple; No bruits  CVS: RRR, S1, S2; No M/G/R  LUNGS: CTAB; No Wheezes; No  Rhonchi: No rales  ABD: Soft, nondistended, nontender.  Upper abdomen wound dressing  without bleeding or discharge  EXT: No edema; Pulses 2+ and intact  Skin exam:  pink  NEURO: CN 2-12 intact; No Focal neurological deficits  CAP REFILL:  Normal  MENTAL STATUS:  Normal    Exam done by Dillard Essex, MD on 07/09/19 at 9:02 AM      LABS     Recent Labs   Lab 07/10/19  0624 07/09/19  1246 07/09/19  0353 07/08/19  0357 07/08/19  0357   WBC 11.39*  --  12.33*  --  14.31*   RBC 2.63*  --  2.30*  --  2.54*   Hgb 7.4* 6.8* 6.3*  More results in Results Review 7.0*   Hematocrit 24.3* 23.0* 21.6*  More results in Results Review 24.0*   MCV 92.4  --  93.9  --  94.5   Platelets 160  --  146  --  150   More results in Results Review = values in this interval not displayed.       Recent Labs   Lab 07/10/19  0624 07/09/19  0353 07/08/19  0357 07/07/19  0426 07/06/19  0332   Sodium 136 137 137 138 138   Potassium 4.5 4.2 4.7 4.5 4.0   Chloride 100 101 102 102 102   CO2 24 26 23 24 25    BUN 45.0* 28.0* 53.0* 42.0* 27.0*   Creatinine 5.8* 4.1* 6.1* 5.2* 3.9*   Glucose 256* 170* 149* 180* 227*   Calcium 8.4* 7.9* 8.1* 8.0* 8.0*   Magnesium 1.8 1.7 1.7 1.8 1.7       Recent Labs   Lab 07/10/19  0624 07/09/19  0353 07/08/19  0357   Albumin 2.2* 2.2* 2.3*                   Microbiology Results     Procedure Component Value Units Date/Time    COVID-19 (SARS-COV-2) [facility]) [[id number]] Collected: 06/30/19 2256    Specimen: Nasopharyngeal Swab from Nasopharynx Updated: 06/30/19 2341     Purpose of COVID testing Screening     SARS-CoV-2 Specimen Source Nasopharyngeal     SARS CoV 2 Overall Result Negative     Comment: Test performed using the Abbott ID NOW EUA assay.  Please see Fact Sheets for patients and providers located at:  http://olson-hall.info/  This test is for the qualitative detection of SARS-CoV-2  (COVID19) nucleic acid. Viral nucleic acids may persist in vivo,  independent of viability. Detection of viral  nucleic acid does  not imply the presence of infectious virus, or that virus  nucleic acid is the cause of clinical symptoms. Negative  results should be treated as presumptive and, if inconsistent  with clinical signs and symptoms or necessary for patient  management, should be tested with an alternative molecular  assay. Negative results do not preclude SARS-CoV-2 infection  and should not be used as the sole basis for patient  management decisions. Invalid results may be due to inhibiting  substances in the specimen and recollection should occur.         Narrative:      Screening    CULTURE BLOOD AEROBIC AND ANAEROBIC [[id number]] Collected: 07/01/19 2340    Specimen: Blood, Venipuncture Updated: 07/07/19 0721    Narrative:      ORDER#: [id number]  ORDERED BY: JABBAR, LUBNA  SOURCE: Blood, Venipuncture                          COLLECTED:  07/01/19 23:40  ANTIBIOTICS AT COLL.:                                RECEIVED :  07/02/19 04:54  Culture Blood Aerobic and Anaerobic        FINAL       07/07/19 07:21  07/07/19   No growth after 5 days of incubation.      CULTURE BLOOD AEROBIC AND ANAEROBIC [[id number]] Collected: 07/01/19 2340    Specimen: Blood, Venipuncture Updated: 07/07/19 0721    Narrative:      ORDER#: [id number]                                    ORDERED BY: Genia Harold, LUBNA  SOURCE: Blood, Venipuncture                          COLLECTED:  07/01/19 23:40  ANTIBIOTICS AT COLL.:                                RECEIVED :  07/02/19 04:54  Culture Blood Aerobic and Anaerobic        FINAL       07/07/19 07:21  07/07/19   No growth after 5 days of incubation.      Culture + Gram Stain,Aerobic, Wound [[id number]] Collected: 07/06/19 1826    Specimen: Wound from Abscess Updated: 07/09/19 0850    Narrative:      ORDER#: [id number]                                    ORDERED BY: Rebecka Apley  SOURCE: Abscess groin                                COLLECTED:  07/06/19 18:26  ANTIBIOTICS AT  COLL.:                                RECEIVED :  07/06/19 22:59  Stain, Gram                                FINAL       07/07/19 02:32  07/07/19   Few WBCs             No organisms seen  Culture and Gram Stain, Aerobic, Wound     PRELIM      07/09/19 08:50   +  07/09/19   Heavy growth of Staphylococcus aureus               Susceptibility to follow        Culture + Gram Lenise Arena Wound [[id number]] Collected: 07/02/19 0009    Specimen: Wound from Discharge Updated: 07/04/19 0934    Narrative:      ORDER#: [id number]  ORDERED BY: JABBAR, LUBNA  SOURCE: Discharge abdominal wound                    COLLECTED:  07/02/19 00:09  ANTIBIOTICS AT COLL.:                                RECEIVED :  07/02/19 04:57  Stain, Gram                                FINAL       07/02/19 06:02  07/02/19   Moderate WBCs             Moderate Gram positive cocci             Moderate Gram negative rods             No Squamous epithelial cells seen  Culture and Gram Stain, Aerobic, Wound     FINAL       07/04/19 09:34   +  07/04/19   Heavy growth of Pseudomonas aeruginosa      07/04/19   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      _____________________________________________________________________________                                  P.aeruginosa        MRSA        ANTIBIOTICS                     MIC  INTRP      MIC  INTRP      _____________________________________________________________________________  Amikacin                        <=8    S                        Aztreonam                        8     S                        Cefepime                         4     S                        Ceftazidime                      4     S                        Ciprofloxacin                 <=0.25   S                        Clindamycin                                    <=  0.5   S        Daptomycin                                      <=1    S        Erythromycin                                     >4     R        Gentamicin                       4     S        <=2    S        Levofloxacin                   <=0.5   S        >4     R        Linezolid                                       <=1    S        Meropenem                      <=0.5   S                        Oxacillin                                       >2     R  D1    Penicillin                                      >1     R        Piperacillin/Tazobactam         8/4    S                        Rifampin                                       <=0.5   S        Tetracycline                                   <=0.5   S        Tobramycin                      <=2    S                        Trimethoprim/Sulfamethoxazole                 <=  0.5/9  S        Vancomycin                                       1     S          -----DRUG COMMENTS----------    D1:  Oxacillin sensitivity predicts sensitivity to cephalosporins.         [place] [employer] 2016  _____________________________________________________________________________            S=SUSCEPTIBLE     I=INTERMEDIATE     R=RESISTANT                            N/S=NON-SUSCEPTIBLE  _____________________________________________________________________________             Tunneled Cath Check/Change (Permcath)    Result Date: 07/07/2019   Successful right internal jugular tunneled dialysis catheter exchange Nelma Rothman, MD  07/03/2019 2:21 PM        Echo Results     None          RADIOLOGY     Upon my review:    Tommie Raymond  9:16 AM 07/10/2019

## 2019-07-10 NOTE — Progress Notes (Signed)
[facility] PROGRESS NOTE  Aaron Edelman, x [phone] [facility] Spectralink)      Date Time: 07/10/19 6:03 AM  Patient Name: [name]  Attending Physician: Dillard Essex, MD    CC: follow-up CKD    Assessment:     1. ESRD- on HD at [facility]  2. Volume overload- mild  3. HTN  4. Anemia- Hb down to 6.8 (TSAT 22%)  5. Secondary HPT  6. Abdominal surgical wound- not healing, MRSA and Pseudomonas  7. Recurrent falls      Recommendations:     HD today  Cont Epo  Transfuse with dialysis      Case discussed with: pt, HD RN    Armando Reichert, MD  [facility]  [phone] (office)  X [phone] (FFX Spectra-Link)      Subjective: No new complaints today    Review of Systems:   Review of Systems - No CP, SOB, dizziness    Physical Exam:     Vitals:    07/10/19 0500 07/10/19 0505 07/10/19 0526 07/10/19 0545   BP: 112/70 112/70 122/70 122/70   Pulse: 70 72 72 72   Resp: 18  18 18    Temp: 97.8 F (36.6 C) 97.9 F (36.6 C) 98 F (36.7 C) 98 F (36.7 C)   TempSrc:  Oral Oral Oral   SpO2:  95% 96%    Weight:       Height:           Intake and Output Summary (Last 24 hours) at Date Time    Intake/Output Summary (Last 24 hours) at 07/10/2019 0603  Last data filed at 07/09/2019 1739  Gross per 24 hour   Intake 1110 ml   Output 0 ml   Net 1110 ml     General: awake, alert, oriented x 3, no acute distress.  Cardiovascular: regular rate and rhythm  Lungs: clear to auscultation bilaterally, bilateral air entry, normal work of breathing  Abdomen: soft; LLQ wound bandaged  Extremities: trace edema  Other: RIJ Permcath    Meds:      Scheduled Meds: PRN Meds:    acetaminophen, 1,000 mg, Oral, TID  amLODIPine, 5 mg, Oral, Q12H  atorvastatin, 40 mg, Oral, Daily  carvedilol, 25 mg, Oral, Q12H  collagenase, , Topical, Q24H  darbepoetin alfa, 0.45 mcg/kg, Subcutaneous, Weekly  gabapentin, 300 mg, Oral, Daily  heparin (porcine), 5,000 Units, Subcutaneous, Q8H [place]  insulin glargine, 30 Units, Subcutaneous,  QAM  insulin lispro, 1-3 Units, Subcutaneous, QHS  insulin lispro, 1-5 Units, Subcutaneous, TID AC  levoFLOXacin, 500 mg, Oral, Q48H  miconazole 2 % with zinc oxide, , Topical, Q6H  nystatin, 1 application, Topical, BID  pantoprazole, 40 mg, Oral, BID  sevelamer, 800 mg, Oral, TID MEALS  sodium hypochlorite, , Irrigation, Daily  vancomycin, , Intravenous, See Admin Instructions  vitamin C, 500 mg, Oral, Daily  zinc sulfate, 220 mg, Oral, Daily          Continuous Infusions:   sodium chloride, , PRN  dextrose, 15 g of glucose, PRN   And  dextrose, 12.5 g, PRN   And  glucagon (rDNA), 1 mg, PRN  melatonin, 3 mg, QHS PRN  naloxone, 0.2 mg, PRN  ondansetron, 4 mg, Q6H PRN   Or  ondansetron, 4 mg, Q6H PRN  oxyCODONE, 5 mg, Q6H PRN              Labs:     Recent Labs   Lab 07/09/19  1246 07/09/19  5003 07/08/19  0357 07/07/19  0426 07/07/19  0426   WBC  --  12.33* 14.31*  --  9.07   Hgb 6.8* 6.3* 7.0*  More results in Results Review 7.1*   Hematocrit 23.0* 21.6* 24.0*  More results in Results Review 24.4*   Platelets  --  146 150  --  125*   More results in Results Review = values in this interval not displayed.     Recent Labs   Lab 07/09/19  0353 07/08/19  0357 07/07/19  0426   Sodium 137 137 138   Potassium 4.2 4.7 4.5   Chloride 101 102 102   CO2 26 23 24    BUN 28.0* 53.0* 42.0*   Creatinine 4.1* 6.1* 5.2*   Calcium 7.9* 8.1* 8.0*   Albumin 2.2* 2.3* 2.4*   Phosphorus 3.3 3.8 4.1   Magnesium 1.7 1.7 1.8   Glucose 170* 149* 180*   EGFR 13.2 8.4 10.1             Invalid input(s): LEUKOCYTESUR              Signed by: Armando Reichert, MD

## 2019-07-10 NOTE — Progress Notes (Signed)
Pt received one unit of blood. All scheduled vital signs done and are within the normal range. Blood was started at 1.26 am and completed at 5:00am. No immediate reaction observed. Pt educated on possible reaction. Will continue to assess pt. Fall bundle in place.

## 2019-07-10 NOTE — Plan of Care (Signed)
Patient is alert and oriented x 4. On RA.Vital signs are stable.  No C/O pain or dizziness. Due treatment given given as ordered.Fall bundle in place.Will continue to assess pt.  Problem: Pain  Goal: Pain at adequate level as identified by patient  07/10/2019 0820 by Ezequiel Essex, RN  Outcome: Progressing  Flowsheets (Taken 07/10/2019 0820)  Pain at adequate level as identified by patient:   Identify patient comfort function goal   Assess for risk of opioid induced respiratory depression, including snoring/sleep apnea. Alert healthcare team of risk factors identified.   Assess pain on admission, during daily assessment and/or before any "as needed" intervention(s)   Reassess pain within 30-60 minutes of any procedure/intervention, per Pain Assessment, Intervention, Reassessment (AIR) Cycle   Evaluate if patient comfort function goal is met   Evaluate patient's satisfaction with pain management progress  07/10/2019 0820 by Ezequiel Essex, RN  Reactivated     Problem: Safety  Goal: Patient will be free from injury during hospitalization  Outcome: Progressing

## 2019-07-10 NOTE — Progress Notes (Signed)
Pt is alert and oriented. Pt denied pain and sob. No other complain given. R Permacath access is optimally working post dialysis. Catheter dressing clean, dry and intact. Pt's BP soft during tx and Albumin 25% given as ordered.  Hemodialysis ended and pt. able to finish 3.5 hrs tx.  Able to removed Net Fluid of 3L.  Reports were given to Primary nurse.       07/10/19 1330   Treatment Summary   Time Off Machine 1320   Duration of Treatment (Hours) 3.5   Treatment Type 1:1   Dialyzer Clearance Moderately streaked   Fluid Volume Off (mL) 3500   Prime Volume (mL) 200   Rinseback Volume (mL) 200   Fluid Given: Normal Saline (mL) 0   Fluid Given: PRBC  0 mL   Fluid Given: Albumin (mL) 100   Fluid Given: Other (mL) 0   Total Fluid Given 500   Hemodialysis Net Fluid Removed 3000   Post Treatment Assessment   Post-Treatment Weight (Kg) 105.5   Patient Response to Treatment Fairly tolerate   Additional Dialyzer Used 0   Permacath Catheter - Tunneled 07/03/19 Right   Placement Date/Time: 07/03/19 1335   Inserted by: Lavone Neri MD  Orientation: Right  Catheter Tip Location: (c) SVC  Central Line Infection Prevention Education provided?: Yes  Hand Hygiene: Chlorhexidine  Line cart used?: No  Line kit used?: Yes  Maximum ...   Line necessity reviewed? Apheresis/hemodialysis   Catheter Lumen Volume Venous 1.7 mL   Catheter Lumen Volume Arterial 1.7 mL   Dressing Status and Intervention Dressing Intact;Clean & Dry   Tego/Curos Caps on Catheter Yes   NEW Tego/Curos Caps placed (Date) 07/10/19   End Caps Free From Blood Yes   Line Care Connections checked and tightened   Dressing Type Transparent;Biopatch   Dressing Status Clean;Dry;Intact   Dressing/Line Intervention Caps changed   Line Used For Blood Draw No   Dressing Change Due 07/11/19   Vitals   Temp 97.6 F (36.4 C)   Heart Rate 70   Resp Rate 18   BP 122/75   SpO2 100 %   O2 Device None (Room air)   Assessment   Mental Status Alert;Oriented   Respiratory Pattern Regular    Bilateral Breath Sounds Clear;Diminished   R Breath Sounds Clear;Diminished   L Breath Sounds Clear;Diminished   RLE Edema Non Pitting Edema   LLE Edema Non Pitting Edema   General Skin Color Appropriate for ethnicity   Skin Condition/Temp Warm;Dry   Abdomen Inspection Nondistended   GI Symptoms None   Mobility Ambulatory with Assistance   Pain Assessment   Charting Type Assessment   Pain Scale Used Numeric Scale (0-10)   Numeric Pain Scale   Pain Score 0   POSS Score 1   Education   Person taught Patient   Knowledge basis Minimal   Topics taught Access care;S&S of infection;Fluid Management;K+;Procedure   Teaching Tools Explain   Reponse Verbalizes Understanding   Bedside Nurse Communication   Name of bedside RN - post dialysis Gaynelle Cage, RN

## 2019-07-10 NOTE — Progress Notes (Signed)
Pt is contact isolation,  PPE done per protocol.  Pt alert and oriented.  Pt denied pain and sob.  R permacath access is optimally working and catheter dressing clean,dry and intact. BP soft Pre-dialysis. Timeouts and safety checks done. Will closely monitor the pt.       07/10/19 0945   Bedside Nurse Communication   Name of bedside RN - pre dialysis Gaynelle Cage, RN   Treatment Initiation- With Dialysis Precautions   Time Out/Safety Check Completed Yes   Consent for HD signed for this hospitalization (Date) 05/23/19   Consent for HD signed for this hospitalization (Time) 1044   Blood Consent Verified N/A   Dialysis Precautions All Connections Secured;Saline Line Double Clamped;Venous Parameters Set;Arterial Parameters Set;Air Foam Detecctor Engaged   Dialysis Treatment Type Routine;Bedside   Special Considerations Contact isolation   Is patient diabetic? Yes   RO/Hemodialysis Architectural technologist   Is Total Chlorine less than 0.1 ppm? Yes   Orignial Total Chlorine Testing Time 0910   At 4 Hour Total Chlorine Testing Time 1310   RO/Hemodialysis Facilities manager Number 7    Machine Serial Number   [id number]   RO # 27   RO Serial #   [id number])   Water Hardness NA   pH 7   Pressure Test Verified Yes   Alarms Verified Passed   Machine Temperature 98.6 F (37 C)   Alarms Verified Yes   Na+ mEq (Machine) 138 mEq   Bicarb mEq (Machine) 35 mEq   Hemodialysis Conductivity (Machine) 13.8   Hemodialysis Conductivity (Meter) 13.8   Dialyzer Lot Number [id number]   Tubing Lot Number [id number]   RO Machine Log Completed Yes   Hepatitis Status   HBsAg (Antigen) Result Negative   HBsAg Date Drawn 06/12/19   HBsAg Repeat Draw Due Date 07/10/19   Dialysis Weight   Pre-Treatment Weight (Kg) 108.5   Scale Type ICU Bed Scale   Vitals   Temp 98 F (36.7 C)   Heart Rate 69   Resp Rate 18   BP 109/68   SpO2 98 %   O2 Device None (Room air)   Assessment   Mental Status Alert;Oriented   Respiratory Pattern Regular    Bilateral Breath Sounds Clear;Diminished   R Breath Sounds Clear;Diminished   L Breath Sounds Clear;Diminished   RLE Edema Non Pitting Edema   LLE Edema Non Pitting Edema   General Skin Color Appropriate for ethnicity   Skin Condition/Temp Warm;Dry   Abdomen Inspection Nondistended   GI Symptoms None   Mobility Ambulatory with Assistance   Permacath Catheter - Tunneled 07/03/19 Right   Placement Date/Time: 07/03/19 1335   Inserted by: Lavone Neri MD  Orientation: Right  Catheter Tip Location: (c) SVC  Central Line Infection Prevention Education provided?: Yes  Hand Hygiene: Chlorhexidine  Line cart used?: No  Line kit used?: Yes  Maximum ...   Line necessity reviewed? Apheresis/hemodialysis   Catheter Lumen Volume Venous 1.7 mL   Catheter Lumen Volume Arterial 1.7 mL   Dressing Status and Intervention Dressing Intact;Clean & Dry   Tego/Curos Caps on Catheter Yes   Line Care Connections checked and tightened   Dressing Type Transparent;Biopatch   Dressing Status Clean;Dry;Intact   Line Used For Blood Draw No   Dressing Change Due 07/11/19   Pain Assessment   Charting Type Assessment   Pain Scale Used Numeric Scale (0-10)   Numeric Pain Scale   Pain Score 0  POSS Score 1   Hemodialysis Comments   Pre-Hemodialysis Comments Timeout and safety checks done

## 2019-07-10 NOTE — Progress Notes (Signed)
Nutritional Support Services  Nutrition Note    Diane STENSETH 64 y.o. female   MRN: [id number]      Referral Source: Census Review   Reason for Referral: LOS     Diet: Renal 80g protein - estimated protein needs within protein restriction, will continue currently    Education Provided: Education provided at recent admission     Clinical Nutrition Summary: Pt seen sitting up in bed AAOx4. Pt reports good intake both pta and currently. Consistently consuming 75-100% of meals. No reports of N/V/D/C. Some slight fluctuations in weight, but overall weight stable- fluctuations likely related to HD. No overt signs of malnutrition. No additional issues reported at this time.     No nutrition diagnosis at this time.      Patient is currently at low nutritional risk; will be available within 10 days and PRN.    Edsel Petrin MS, RDN, [place], [place]  Clinical Dietitian  [phone]

## 2019-07-10 NOTE — OT Progress Note (Signed)
Occupational Therapy Note  [facility]   Occupational Therapy Attempt Note    Patient:  [name] MRN#:  [id number]  Unit:  [address] [place] Room/Bed:  Y1950/D3267.12      Occupational Therapy treatment attempted on 07/10/2019 at 1:00 PM unable to complete secondary to pt currently receiving dialysis treatment. Will attempt to f/u later today as able.     RN aware.        [place], [place], OTR/L [phone]  07/10/2019 1:00 PM

## 2019-07-11 LAB — RENAL FUNCTION PANEL
Albumin: 2.7 g/dL — ABNORMAL LOW (ref 3.5–5.0)
Anion Gap: 10 (ref 5.0–15.0)
BUN: 27 mg/dL — ABNORMAL HIGH (ref 7.0–19.0)
CO2: 28 mEq/L (ref 22–29)
Calcium: 8.7 mg/dL (ref 8.5–10.5)
Chloride: 101 mEq/L (ref 100–111)
Creatinine: 4.4 mg/dL — ABNORMAL HIGH (ref 0.6–1.0)
Glucose: 166 mg/dL — ABNORMAL HIGH (ref 70–100)
Phosphorus: 3.8 mg/dL (ref 2.3–4.7)
Potassium: 4.4 mEq/L (ref 3.5–5.1)
Sodium: 139 mEq/L (ref 136–145)

## 2019-07-11 LAB — CBC
Absolute NRBC: 0 10*3/uL (ref 0.00–0.00)
Hematocrit: 25.4 % — ABNORMAL LOW (ref 34.7–43.7)
Hgb: 7.5 g/dL — ABNORMAL LOW (ref 11.4–14.8)
MCH: 27.4 pg (ref 25.1–33.5)
MCHC: 29.5 g/dL — ABNORMAL LOW (ref 31.5–35.8)
MCV: 92.7 fL (ref 78.0–96.0)
MPV: 11.2 fL (ref 8.9–12.5)
Nucleated RBC: 0 /100 WBC (ref 0.0–0.0)
Platelets: 177 10*3/uL (ref 142–346)
RBC: 2.74 10*6/uL — ABNORMAL LOW (ref 3.90–5.10)
RDW: 14 % (ref 11–15)
WBC: 9.41 10*3/uL (ref 3.10–9.50)

## 2019-07-11 LAB — MAGNESIUM: Magnesium: 1.8 mg/dL (ref 1.6–2.6)

## 2019-07-11 LAB — GLUCOSE WHOLE BLOOD - POCT
Whole Blood Glucose POCT: 188 mg/dL — ABNORMAL HIGH (ref 70–100)
Whole Blood Glucose POCT: 248 mg/dL — ABNORMAL HIGH (ref 70–100)
Whole Blood Glucose POCT: 368 mg/dL — ABNORMAL HIGH (ref 70–100)
Whole Blood Glucose POCT: 311 mg/dL — ABNORMAL HIGH (ref 70–100)

## 2019-07-11 LAB — RECTAL SWAB CARBAPENEMASE SURVEILLANCE PCR
IMP-Gram Neg Res Genes PCR: NOT DETECTED
KPC-Gram Neg Res Genes PCR: NOT DETECTED
NDM Gram Neg Res Genes PCR: NOT DETECTED
OXA-48-GRAM NEG RES GENES PCR: NOT DETECTED
VIM-Gram Neg Res Genes PCR: NOT DETECTED

## 2019-07-11 LAB — HEMOLYSIS INDEX: Hemolysis Index: 0 (ref 0–18)

## 2019-07-11 LAB — GFR: EGFR: 12.2

## 2019-07-11 NOTE — Progress Notes (Signed)
Wound Healing Center Navigator Note         [name] is a 64 y.o. female admitted with Dizziness.    Summary/Assessment:      Wound care of ABD wound managed by inpatient wound care team. Pt identified that they could benefit from services at the [facility] by Dr Joan Flores. Met with pt explained [facility] services, locations and navigator role.  Pt will access [facility] to speed healing and if closure needed once wound filled in, Pt will return to Dr Joan Flores                                                               Plan:  Pt to call Monday for appointment. Navigator to follow      LOS:  LOS: 9 days   POD: 8 Days Post-Op    PMH:   Past Medical History:   Diagnosis Date    Chronic obstructive pulmonary disease     Diabetes mellitus     Hyperlipidemia     Hypertension     Sleep apnea 2018     PSH:   Past Surgical History:   Procedure Laterality Date    EGD, BIOPSY N/A 05/14/2019    Procedure: EGD, BIOPSY;  Surgeon: Ronie Spies, MD;  Location: [facility];  Service: Gastroenterology;  Laterality: N/A;    EXPLORATORY LAPAROTOMY N/A 05/27/2019    Procedure: EXPLORATORY LAPAROTOMY;  Surgeon: Herbert Moors., MD;  Location: [facility];  Service: General;  Laterality: N/A;    JOINT REPLACEMENT      shoulder    JOINT REPLACEMENT      knee    OVARY SURGERY Left     Removal    REMOVAL, FOREIGN BODY N/A 05/27/2019    Procedure: REMOVAL, PERITONEAL DIALYSIS CATHETER;  Surgeon: Herbert Moors., MD;  Location: [facility];  Service: General;  Laterality: N/A;    TUNNELED CATH CHECK/CHANGE (PERMCATH) N/A 07/03/2019    Procedure: TUNNELED CATH CHECK/CHANGE;  Surgeon: Maureen Ralphs, MD;  Location: [facility];  Service: Interventional Radiology;  Laterality: N/A;    TUNNELED CATH PLACEMENT (PERMCATH) Bilateral 05/23/2019    Procedure: TUNNELED CATH PLACEMENT;  Surgeon: Jacques Earthly, MD;  Location: [facility];  Service: Interventional Radiology;  Laterality: Bilateral;                                                                         Nonie Hoyer RN, BSN  [facility] Navigator   [email or web address]  [phone]

## 2019-07-11 NOTE — Progress Notes (Signed)
SOUND HOSPITALIST  PROGRESS NOTE      Patient: [name]  Date: 07/11/2019   LOS: 9 Days  Admission Date: 06/30/2019   MRN: [id number]  Attending: Adrienne Mocha  Please contact me via [place]       ASSESSMENT/PLAN     [name] is a 64 y.o. female admitted with Dizziness    Interval Summary:     Active Hospital Problems    Diagnosis    Dizziness    Fall    Open wound, abdominal wall, lateral, subsequent encounter    Uncontrolled type 2 diabetes mellitus with hyperglycemia    Hypertension    Hyperlipidemia    History of COPD    History of anemia due to chronic kidney disease    ESRD (end stage renal disease) on dialysis      Patient has been admitted with febrile illness, confusion and proneness to falls.  Patient was found to have abdominal wall cellulitis/wound.  Cultures turned positive methicillin resistant Staphylococcus aureus and Pseudomonas. aeruginosa.  Patient has been treated with combination of IV vancomycin in levofloxacin.  ID note and recommendations appreciated.  Patient also had been evaluated by plastic surgery.Patient's lab shows anemia, hemoglobin low at 6.4 repeated hemoglobin 6.8.  Patient is on dialysis.  Considering patient's progress to falls and complaint of dizziness.  s/p 1 unit packed RBCs transfusion.       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 S/p 1 unit pRBC. Iron profile suggestive of AOCD  Transient alteration in mental status - mental status back at baseline  Essential hypertension continue amlodipine  Hyperlipidemia continue atorvastatin  Neuropathy, NOS  DM2, continue basal insulin glargine 30units daily and continue slight scale coverage with lispro  Continue  IV vancomycin and oral Levaquin  Inguinal fungal dermatitis,  on miconazole    Consultants, ID and plastic surgery notes and recommendations appreciated      PT/OT evaluation ono 6/8 recommending SNF; patient is reluctant   Pain management: Continue as needed acetaminophen and if needed as needed oxycodone.       Analgesia: Acetaminophen, oxycodone    Nutrition: Renal    DVT Prophylaxis: Heparin       Code Status: DNR    DISPO: SNF once medically stable    Family Contact: Daughter                      SUBJECTIVE   Patient seen and examined.  Reports some dizziness otherwise no complaints.  Updated on plan of care.  Denies chest pain shortness breath abdominal pain nausea vomiting diarrhea       MEDICATIONS     Current Facility-Administered Medications   Medication Dose Route Frequency    acetaminophen  1,000 mg Oral TID    amLODIPine  5 mg Oral Q12H    atorvastatin  40 mg Oral Daily    carvedilol  25 mg Oral Q12H    collagenase   Topical Q24H    darbepoetin alfa  0.45 mcg/kg Subcutaneous Weekly    gabapentin  300 mg Oral Daily    heparin (porcine)  5,000 Units Subcutaneous Q8H [place]    insulin glargine  30 Units Subcutaneous QAM    insulin lispro  1-3 Units Subcutaneous QHS    insulin lispro  1-5 Units Subcutaneous TID AC  miconazole 2 % with zinc oxide   Topical Q6H    nystatin  1 application Topical BID    pantoprazole  40 mg Oral BID    sevelamer  800 mg Oral TID MEALS    sodium hypochlorite   Irrigation Daily    vancomycin   Intravenous See Admin Instructions    vitamin C  500 mg Oral Daily    zinc sulfate  220 mg Oral Daily       PHYSICAL EXAM     Vitals:    07/11/19 1728   BP: 117/68   Pulse: 70   Resp: 18   Temp: 97.2 F (36.2 C)   SpO2: 98%       Temperature: Temp  Min: 97.2 F (36.2 C)  Max: 98.2 F (36.8 C)  Pulse: Pulse  Min: 63  Max: 70  Respiratory: Resp  Min: 16  Max: 18  Non-Invasive BP: BP  Min: 108/74  Max: 178/97  Pulse Oximetry SpO2  Min: 96 %  Max: 99 %    Intake and Output Summary (Last 24 hours) at Date Time    Intake/Output Summary (Last 24 hours) at 07/11/2019 1924  Last data filed at 07/11/2019 1000  Gross per 24  hour   Intake 1140 ml   Output 50 ml   Net 1090 ml               PHYSICAL EXAMINATION  GEN APPEARANCE: NAD.  Pleasant, alert and cooperative.    HEENT: NCAT, EOMI, PERRL, no nasal discharge.   conjunctivae/corneas clear. Oral mucosa moist.   Neck: Supple without meningismus   CVS: RRR, S1, S2. No murmur   LUNGS: CTAB; No wheezes, crackles, rhonchi or rales  ABD: BS+, soft, NT, ND, no guarding or rigidity  LUQ wound, no drainage   EXT: No edema; Pulses 2+ and intact  SKIN: skin is warm and dry. No rash noted.  MSK: full ROM intact. No joint tenderness or swelling.   NEURO: AAOx3, CN 2-12 intact.  No focal neurological deficits  MENTAL STATUS: appropriate affect and mood        LABS     Recent Labs   Lab 07/11/19  0500 07/10/19  0624 07/09/19  1246 07/09/19  0353 07/09/19  0353   WBC 9.41 11.39*  --   --  12.33*   RBC 2.74* 2.63*  --   --  2.30*   Hgb 7.5* 7.4* 6.8*  More results in Results Review 6.3*   Hematocrit 25.4* 24.3* 23.0*  More results in Results Review 21.6*   MCV 92.7 92.4  --   --  93.9   Platelets 177 160  --   --  146   More results in Results Review = values in this interval not displayed.       Recent Labs   Lab 07/11/19  0500 07/10/19  0624 07/09/19  0353 07/08/19  0357 07/07/19  0426   Sodium 139 136 137 137 138   Potassium 4.4 4.5 4.2 4.7 4.5   Chloride 101 100 101 102 102   CO2 28 24 26 23 24    BUN 27.0* 45.0* 28.0* 53.0* 42.0*   Creatinine 4.4* 5.8* 4.1* 6.1* 5.2*   Glucose 166* 256* 170* 149* 180*   Calcium 8.7 8.4* 7.9* 8.1* 8.0*   Magnesium 1.8 1.8 1.7 1.7 1.8       Recent Labs   Lab 07/11/19  0500 07/10/19  0624 07/09/19  0353   Albumin  2.7* 2.2* 2.2*                   Microbiology Results     Procedure Component Value Units Date/Time    COVID-19 (SARS-COV-2) [employer] [facility]) [[id number]] Collected: 06/30/19 2256    Specimen: Nasopharyngeal Swab from Nasopharynx Updated: 06/30/19 2341     Purpose of COVID testing Screening     SARS-CoV-2 Specimen Source Nasopharyngeal     SARS CoV 2 Overall  Result Negative     Comment: Test performed using the Abbott ID NOW EUA assay.  Please see Fact Sheets for patients and providers located at:  http://[facility]-hall.info/  This test is for the qualitative detection of SARS-CoV-2  (COVID19) nucleic acid. Viral nucleic acids may persist in vivo,  independent of viability. Detection of viral nucleic acid does  not imply the presence of infectious virus, or that virus  nucleic acid is the cause of clinical symptoms. Negative  results should be treated as presumptive and, if inconsistent  with clinical signs and symptoms or necessary for patient  management, should be tested with an alternative molecular  assay. Negative results do not preclude SARS-CoV-2 infection  and should not be used as the sole basis for patient  management decisions. Invalid results may be due to inhibiting  substances in the specimen and recollection should occur.         Narrative:      Screening    CULTURE BLOOD AEROBIC AND ANAEROBIC [[id number]] Collected: 07/01/19 2340    Specimen: Blood, Venipuncture Updated: 07/07/19 0721    Narrative:      ORDER#: [id number]                                    ORDERED BY: Genia Harold, LUBNA  SOURCE: Blood, Venipuncture                          COLLECTED:  07/01/19 23:40  ANTIBIOTICS AT COLL.:                                RECEIVED :  07/02/19 04:54  Culture Blood Aerobic and Anaerobic        FINAL       07/07/19 07:21  07/07/19   No growth after 5 days of incubation.      CULTURE BLOOD AEROBIC AND ANAEROBIC [[id number]] Collected: 07/01/19 2340    Specimen: Blood, Venipuncture Updated: 07/07/19 0721    Narrative:      ORDER#: [id number]                                    ORDERED BY: Genia Harold, LUBNA  SOURCE: Blood, Venipuncture                          COLLECTED:  07/01/19 23:40  ANTIBIOTICS AT COLL.:                                RECEIVED :  07/02/19 04:54  Culture Blood Aerobic and Anaerobic        FINAL       07/07/19 07:21  07/07/19   No growth after 5  days  of incubation.      Culture + Gram Stain,Aerobic, Wound [[id number]] Collected: 07/06/19 1826    Specimen: Wound from Abscess Updated: 07/09/19 0850    Narrative:      ORDER#: [id number]                                    ORDERED BY: Rebecka Apley  SOURCE: Abscess groin                                COLLECTED:  07/06/19 18:26  ANTIBIOTICS AT COLL.:                                RECEIVED :  07/06/19 22:59  Stain, Gram                                FINAL       07/07/19 02:32  07/07/19   Few WBCs             No organisms seen  Culture and Gram Stain, Aerobic, Wound     PRELIM      07/09/19 08:50   +  07/09/19   Heavy growth of Staphylococcus aureus               Susceptibility to follow        Culture + Gram Stain,Aerobic, Wound [[id number]] Collected: 07/02/19 0009    Specimen: Wound from Discharge Updated: 07/04/19 0934    Narrative:      ORDER#: [id number]                                    ORDERED BY: Sheppard Coil  SOURCE: Discharge abdominal wound                    COLLECTED:  07/02/19 00:09  ANTIBIOTICS AT COLL.:                                RECEIVED :  07/02/19 04:57  Stain, Gram                                FINAL       07/02/19 06:02  07/02/19   Moderate WBCs             Moderate Gram positive cocci             Moderate Gram negative rods             No Squamous epithelial cells seen  Culture and Gram Stain, Aerobic, Wound     FINAL       07/04/19 09:34   +  07/04/19   Heavy growth of Pseudomonas aeruginosa      07/04/19   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      _____________________________________________________________________________                                  P.aeruginosa  MRSA        ANTIBIOTICS                     MIC  INTRP      MIC  INTRP      _____________________________________________________________________________  Amikacin                        <=8    S                        Aztreonam                        8     S                        Cefepime                          4     S                        Ceftazidime                      4     S                        Ciprofloxacin                 <=0.25   S                        Clindamycin                                    <=0.5   S        Daptomycin                                      <=1    S        Erythromycin                                    >4     R        Gentamicin                       4     S        <=2    S        Levofloxacin                   <=0.5   S        >4     R        Linezolid                                       <=1    S        Meropenem                      <=  0.5   S                        Oxacillin                                       >2     R  D1    Penicillin                                      >1     R        Piperacillin/Tazobactam         8/4    S                        Rifampin                                       <=0.5   S        Tetracycline                                   <=0.5   S        Tobramycin                      <=2    S                        Trimethoprim/Sulfamethoxazole                 <=0.5/9  S        Vancomycin                                       1     S          -----DRUG COMMENTS----------    D1:  Oxacillin sensitivity predicts sensitivity to cephalosporins.         [place] [employer]committee 2016  _____________________________________________________________________________            S=SUSCEPTIBLE     I=INTERMEDIATE     R=RESISTANT                            N/S=NON-SUSCEPTIBLE  _____________________________________________________________________________             No results found.      Echo Results     None          RADIOLOGY     Upon my review:    Signed,  Adrienne Mocha       This chart was generated using hospital voice-recognition software which does not employ spell-checking or grammar-checking features. It was dictated, all or in part, in a busy and often noisy patient care environment. I have taken all usual measures to dictate carefully  and to review all aspects this chart. Nonetheless, given the known and well-documented performance characteristics of VR software in such patient care environments, this dictation still may contain unrecognized and  wholly unintended errors or omissions.

## 2019-07-11 NOTE — Plan of Care (Signed)
A/O oriented x 4. On RA. Vital signs are stable, fall bundle in place.Wound dressing done and due meds given as ordered.Will continue to monitor.  Problem: Pain  Goal: Pain at adequate level as identified by patient  Outcome: Progressing  Flowsheets (Taken 07/10/2019 0820)  Pain at adequate level as identified by patient:   Identify patient comfort function goal   Assess for risk of opioid induced respiratory depression, including snoring/sleep apnea. Alert healthcare team of risk factors identified.   Assess pain on admission, during daily assessment and/or before any "as needed" intervention(s)   Reassess pain within 30-60 minutes of any procedure/intervention, per Pain Assessment, Intervention, Reassessment (AIR) Cycle   Evaluate if patient comfort function goal is met   Evaluate patient's satisfaction with pain management progress     Problem: Compromised Tissue integrity  Goal: Damaged tissue is healing and protected  Outcome: Progressing  Flowsheets (Taken 07/09/2019 0704)  Damaged tissue is healing and protected:   Monitor/assess Braden scale every shift   Reposition patient every 2 hours and as needed unless able to reposition self   Provide wound care per wound care algorithm   Increase activity as tolerated/progressive mobility   Avoid shearing injuries   Keep intact skin clean and dry   Use bath wipes, not soap and water, for daily bathing   Relieve pressure to bony prominences for patients at moderate and high risk

## 2019-07-11 NOTE — Plan of Care (Signed)
Discharge Recommendation: SNF, Other(Comment) (if pt refusing, home with supervision and HHPT)  DME Recommended for Discharge: Hemi walker, Hospital bed, Transport chair    Is an Occupational Therapy Evaluation Indicated at this time? This patient is already on OT caseload.     Treatment/Interventions: Exercise, Gait training, Neuromuscular re-education, Functional transfer training, LE strengthening/ROM, Endurance training, Patient/family training, Equipment eval/education, Bed mobility, Compensatory technique education  PT Frequency: 2-3x/wk     PMP Activity: Step 6 - Walks in Room  Distance Walked (ft) (Step 6,7): 20 Feet  (Please See Therapy Evaluation for device and assistance level needed)    Goals:   Goals  Goal Formulation: With patient  Time for Goal Acheivement: By time of discharge  Goals: Select goal  Pt Will Go Supine To Sit: with stand by assist, to maximize functional mobility and independence, Goal met  Pt Will Perform Sit To Supine: with stand by assist, to maximize functional mobility and independence, Partly met  Pt Will Achieve Sitting Balance: 4/5 moves/returns trunkal midpoint 1-2 inches in multiple planes, to maximize functional mobility and independence, Goal met  Pt Will Perform Sit to Stand: with minimal assist, to maximize functional mobility and independence, Goal met, modified independent, New goal  Pt Will Transfer Bed/Chair: with rolling walker, with moderate assist, to maximize functional mobility and independence, Goal met, with hemi walker, modified independent, New goal  Pt Will Ambulate: 51-100 feet, with hemi walker, modified independent

## 2019-07-11 NOTE — Plan of Care (Signed)
Problem: Pain  Goal: Pain at adequate level as identified by patient  Outcome: Progressing  Flowsheets (Taken 07/10/2019 0820 by Ezequiel Essex, RN)  Pain at adequate level as identified by patient:   Identify patient comfort function goal   Assess for risk of opioid induced respiratory depression, including snoring/sleep apnea. Alert healthcare team of risk factors identified.   Assess pain on admission, during daily assessment and/or before any "as needed" intervention(s)   Reassess pain within 30-60 minutes of any procedure/intervention, per Pain Assessment, Intervention, Reassessment (AIR) Cycle   Evaluate if patient comfort function goal is met   Evaluate patient's satisfaction with pain management progress     Problem: Safety  Goal: Patient will be free from injury during hospitalization  Outcome: Progressing  Flowsheets (Taken 07/09/2019 0704 by Ezequiel Essex, RN)  Patient will be free from injury during hospitalization:   Assess patient's risk for falls and implement fall prevention plan of care per policy   Use appropriate transfer methods   Ensure appropriate safety devices are available at the bedside   Include patient/ family/ care giver in decisions related to safety   Provide and maintain safe environment   Hourly rounding  Goal: Patient will be free from infection during hospitalization  Outcome: Progressing  Flowsheets (Taken 07/08/2019 2041 by Maximino Sarin, RN)  Free from Infection during hospitalization:   Assess and monitor for signs and symptoms of infection   Monitor lab/diagnostic results   Monitor all insertion sites (i.e. indwelling lines, tubes, urinary catheters, and drains)     Problem: Pain  Goal: Pain at adequate level as identified by patient  Outcome: Progressing  Flowsheets (Taken 07/10/2019 0820 by Ezequiel Essex, RN)  Pain at adequate level as identified by patient:   Identify patient comfort function goal   Assess for risk of opioid induced respiratory depression,  including snoring/sleep apnea. Alert healthcare team of risk factors identified.   Assess pain on admission, during daily assessment and/or before any "as needed" intervention(s)   Reassess pain within 30-60 minutes of any procedure/intervention, per Pain Assessment, Intervention, Reassessment (AIR) Cycle   Evaluate if patient comfort function goal is met   Evaluate patient's satisfaction with pain management progress     Problem: Side Effects from Pain Analgesia  Goal: Patient will experience minimal side effects of analgesic therapy  Outcome: Progressing  Flowsheets (Taken 07/07/2019 1955 by Maximino Sarin, RN)  Patient will experience minimal side effects of analgesic therapy:   Monitor/assess patient's respiratory status (RR depth, effort, breath sounds)   Prevent/manage side effects per LIP orders (i.e. nausea, vomiting, pruritus, constipation, urinary retention, etc.)   Evaluate for opioid-induced sedation with appropriate assessment tool (i.e. POSS)     Problem: Discharge Barriers  Goal: Patient will be discharged home or other facility with appropriate resources  Outcome: Progressing     Problem: Compromised Tissue integrity  Goal: Damaged tissue is healing and protected  Outcome: Progressing  Flowsheets (Taken 07/09/2019 0704 by Ezequiel Essex, RN)  Damaged tissue is healing and protected:   Monitor/assess Braden scale every shift   Reposition patient every 2 hours and as needed unless able to reposition self   Provide wound care per wound care algorithm   Increase activity as tolerated/progressive mobility   Avoid shearing injuries   Keep intact skin clean and dry   Use bath wipes, not soap and water, for daily bathing   Relieve pressure to bony prominences for patients at moderate and high risk  Goal: Nutritional  status is improving  Outcome: Progressing  Flowsheets (Taken 07/08/2019 2041 by Duanne Guess, [place], RN)  Nutritional status is improving:   Allow adequate time for meals   Assist patient with  eating   Encourage patient to take dietary supplement(s) as ordered     Problem: Renal Instability  Goal: Fluid and electrolyte balance are achieved/maintained  Outcome: Progressing  Flowsheets (Taken 07/10/2019 1035 by Lendell Caprice, RN)  Fluid and electrolyte balance are achieved/maintained:   Monitor/assess lab values and report abnormal values   Monitor daily weight  Goal: Free from infection  Outcome: Progressing  Flowsheets (Taken 07/10/2019 1035 by Lendell Caprice, RN)  Free from infection: Monitor/assess for signs and symptoms of infection     Problem: Patient Receiving Advanced Renal Therapies  Goal: Therapy access site remains intact  Outcome: Progressing  Flowsheets (Taken 07/10/2019 1035 by Lendell Caprice, RN)  Therapy access site remains intact:   Assess therapy access site   Change therapy access site dressing as needed     Problem: Diabetes: Glucose Imbalance  Goal: Blood glucose stable at established goal  Outcome: Progressing  Flowsheets (Taken 07/06/2019 1239 by Venia Minks, RN)  Blood glucose stable at established goal:   Monitor lab values   Monitor intake and output.  Notify LIP if urine output is < 30 mL/hour.   Include patient/family in decisions related to nutrition/dietary selections   Follow fluid restrictions/IV/PO parameters   Assess for hypoglycemia /hyperglycemia   Monitor/assess vital signs   Coordinate medication administration with meals, as indicated   Ensure appropriate diet and assess tolerance   Ensure adequate hydration   Ensure appropriate consults are obtained (Nutrition, Diabetes Education, and Case Management)   Ensure patient/family has adequate teaching materials     Problem: Inadequate Cardiac Output  Goal: Adequate tissue perfusion will be maintained  Outcome: Progressing  Flowsheets (Taken 07/06/2019 0437 by Candice Camp, RN)  Adequate tissue perfusion will be maintained:   Monitor/assess vital signs   Monitor/assess neurovascular status (pulses, capillary refill, pain,  paresthesia, paralysis, presence of edema)   Monitor intake and output   Monitor/assess for signs of VTE (edema of calf/thigh redness, pain)   Monitor/assess lab values and report abnormal values     Problem: Infection  Goal: Free from infection  Outcome: Progressing  Flowsheets (Taken 07/06/2019 1239 by Venia Minks, RN)  Free from infection:   Assess for signs/symptoms of infection   Utilize isolation precautions per protocol/policy   Consult/collaborate with Infection Preventionist   Assess immunization status   Utilize sepsis protocol     Problem: Nutrition  Goal: Nutritional intake is adequate  Outcome: Progressing  Flowsheets (Taken 07/01/2019 1336 by Mickel Baas, RN)  Nutritional intake is adequate:   Monitor daily weights   Consult/collaborate with Clinical Nutritionist   Assess anorexia, appetite, and amount of meal/food tolerated   Include patient/patient care companion in decisions related to nutrition     Pt is A&Ox4. Oxycodone, heat and cold packs used to manage pain 8/10 in her R forearm. She wore the brace on her forearm. Wound care performed. Pt commented that it didn't smell bad like it used to. Pt sat up in the chair a lot of the day. Isolation precautions maintained. PT walked her in the room. Pt had multiple phone conversations with loved ones throughtout the shift and pt sounded like she was in good spirits.

## 2019-07-11 NOTE — Progress Notes (Signed)
[facility] PROGRESS NOTE  Aaron Edelman, x [phone] ([place])      Date Time: 07/11/19 10:48 AM  Patient Name: [name]  Attending Physician: Adrienne Mocha, MD    CC: follow-up CKD    Assessment:     1. ESRD- on HD at [facility]  2. Volume overload- mild  3. HTN  4. Anemia- Hb down to 6.8 (TSAT 22%)  5. Secondary HPT  6. Abdominal surgical wound- not healing, MRSA and Pseudomonas  7. Recurrent falls      Recommendations:     S/p HD yesterday with 3L net ultrafiltration  Plan for HD tomorrow in the morning in anticipation of possible discharge later in the day  Cont Epo  Transfuse with dialysis      Case discussed with: pt, HD RN    Verl Blalock, MD  [facility]  [phone] (office)  X [phone] ([place])      Subjective: doing well, in good spirits    Review of Systems:   Cardio: no chest pain, no edema  Pulm: no SOB  GI: no nausea, vomiting or diarrhea    Physical Exam:     Vitals:    07/10/19 2355 07/11/19 0452 07/11/19 0725 07/11/19 0938   BP: (!) 178/97 132/70 116/70 117/57   Pulse: 68 66 65 63   Resp: 16 18 18     Temp: 98.2 F (36.8 C) 98.2 F (36.8 C) 98.2 F (36.8 C)    TempSrc: Oral Oral Oral    SpO2: 96% 99% 97%    Weight:  111.1 kg (245 lb)     Height:           Intake and Output Summary (Last 24 hours) at Date Time    Intake/Output Summary (Last 24 hours) at 07/11/2019 1048  Last data filed at 07/11/2019 0600  Gross per 24 hour   Intake 720 ml   Output 3050 ml   Net -2330 ml     General: awake, alert, oriented x 3, no acute distress.  Cardiovascular: regular rate and rhythm  Lungs: clear to auscultation bilaterally, bilateral air entry, normal work of breathing  Abdomen: soft; LLQ wound bandaged  Extremities: trace edema  Other: RIJ Permcath    Meds:      Scheduled Meds: PRN Meds:    acetaminophen, 1,000 mg, Oral, TID  amLODIPine, 5 mg, Oral, Q12H  atorvastatin, 40 mg, Oral, Daily  carvedilol, 25 mg, Oral, Q12H  collagenase, , Topical,  Q24H  darbepoetin alfa, 0.45 mcg/kg, Subcutaneous, Weekly  gabapentin, 300 mg, Oral, Daily  heparin (porcine), 5,000 Units, Subcutaneous, Q8H SCH  insulin glargine, 30 Units, Subcutaneous, QAM  insulin lispro, 1-3 Units, Subcutaneous, QHS  insulin lispro, 1-5 Units, Subcutaneous, TID AC  miconazole 2 % with zinc oxide, , Topical, Q6H  nystatin, 1 application, Topical, BID  pantoprazole, 40 mg, Oral, BID  sevelamer, 800 mg, Oral, TID MEALS  sodium hypochlorite, , Irrigation, Daily  vancomycin, , Intravenous, See Admin Instructions  vitamin C, 500 mg, Oral, Daily  zinc sulfate, 220 mg, Oral, Daily          Continuous Infusions:   sodium chloride, , PRN  dextrose, 15 g of glucose, PRN   And  dextrose, 12.5 g, PRN   And  glucagon (rDNA), 1 mg, PRN  melatonin, 3 mg, QHS PRN  naloxone, 0.2 mg, PRN  ondansetron, 4 mg, Q6H PRN   Or  ondansetron, 4 mg, Q6H PRN  oxyCODONE, 5 mg, Q6H  PRN              Labs:     Recent Labs   Lab 07/11/19  0500 07/10/19  0624 07/09/19  1246 07/09/19  0353 07/09/19  0353   WBC 9.41 11.39*  --   --  12.33*   Hgb 7.5* 7.4* 6.8*  More results in Results Review 6.3*   Hematocrit 25.4* 24.3* 23.0*  More results in Results Review 21.6*   Platelets 177 160  --   --  146   More results in Results Review = values in this interval not displayed.     Recent Labs   Lab 07/11/19  0500 07/10/19  0624 07/09/19  0353   Sodium 139 136 137   Potassium 4.4 4.5 4.2   Chloride 101 100 101   CO2 28 24 26    BUN 27.0* 45.0* 28.0*   Creatinine 4.4* 5.8* 4.1*   Calcium 8.7 8.4* 7.9*   Albumin 2.7* 2.2* 2.2*   Phosphorus 3.8 4.2 3.3   Magnesium 1.8 1.8 1.7   Glucose 166* 256* 170*   EGFR 12.2 8.9 13.2             Signed by: Verl Blalock, MD

## 2019-07-11 NOTE — Progress Notes (Signed)
Progress Note- Pharmacy Vancomycin Dosing Consult:  Day 8  Vancomycin Indication: Wound infection   Vancomycin Target Trough Level: Pre-HD level of 15-25 mcg/mL    Pertinent Cultures:    Date Source  Organism & Pertinent Susceptibilities     6/8 Blood cx x2 NG x 5 days    6/9 Wound cx Heavy growth of Pseudomonas aeruginosa   Heavy growth of MRSA   6/13 Wound cx (repeated) Heavy growth of MRSA  Light growth of E faecalis         As appropriate, contact physician to consider change in therapy if cultures grow organism other than MRSA     98.1 F (36.7 C) (Oral)   110.1 kg (242 lb 11.6 oz)  Recent Labs   Lab 07/11/19  0500 07/10/19  0624 07/09/19  0353 07/08/19  0357 07/07/19  0426 07/06/19  0332 07/05/19  0514   WBC 9.41 11.39* 12.33* 14.31* 9.07 8.75 9.13   Creatinine 4.4* 5.8* 4.1* 6.1* 5.2* 3.9* 5.6*   Vancomycin Random  --  17.8  --   --  29.5  --  16.0     Renal Assessment:   Serum creatinine: 5.8 mg/dL (H) 07/10/19 9244  Estimated creatinine clearance: 11.5 mL/min (A)   Baseline SCr: ESRD on HD Tues, Thurs, Sat   Nephrotoxic drugs administered in the past 48 hours: None    Assessment   Measured Vancomycin Level: Pre-HD random level = 17.8, therapeutic   Per ID recommend cipro, doxycycline, and flagyl at discharge       Recommendations:   New Vancomycin level: Pre-HD random level ordered for Sat, 6/19 with AM labs.

## 2019-07-11 NOTE — PT Progress Note (Signed)
[facility]  Physical Therapy Treatment    Patient:  ILLA ENLOW  MRN#:  [id number]  Unit:  [place]  Room/Bed:  A2314/A2314.01    Time of treatment:  Time Calculation  PT Received On: 07/11/19  Start Time: 1100  Stop Time: 1149  Time Calculation (min): 49 min  Total Treatment Time (min): 40 (DME retrieval)            Chart Review and Collaboration with Care Team: 5 minutes, not included in above time.    PT Visit Number: 3    Precautions:   Precautions  Weight Bearing Status: RUE non weight bearing  Other Precautions: contact MRSA    Personal Protective Equipment (PPE)  gloves, procedure mask and eye shield/covering    Updated X-Rays/Tests/Labs:  Lab Results   Component Value Date/Time    HGB 7.5 (L) 07/11/2019 05:00 AM    HCT 25.4 (L) 07/11/2019 05:00 AM    K 4.4 07/11/2019 05:00 AM    NA 139 07/11/2019 05:00 AM    INR 1.1 07/01/2019 10:41 PM    TROPI 0.02 06/23/2019 12:29 PM    TROPI 0.04 05/17/2019 06:20 PM    TROPI 0.05 05/17/2019 01:37 PM    TROPI 0.01 03/18/2006 06:10 PM       All imaging reviewed, please see chart for details.      Subjective:  "I feel ok"    Patient Goal: to go home    Pain Assessment  Pain Assessment: Numeric Scale (0-10)  Pain Score:  (pt does not rate numerically)  Pain Location: Wrist  Pain Orientation: Right  Pain Frequency: Increases with movement           Patient's medical condition is appropriate for Physical Therapy intervention at this time.  Patient is agreeable to participation in the therapy session. Nursing clears patient for therapy.      Objective:  Observation of Patient/Vital Signs:  Visit Vitals  BP 108/74   Pulse 64   SpO2 97%       Cognition/Neuro Status  Arousal/Alertness: Appropriate responses to stimuli  Attention Span: Appears intact  Orientation Level: Oriented X4  Memory: Appears intact  Following Commands: Follows all commands and directions without difficulty  Safety Awareness: minimal verbal instruction  Insights:  Educated in safety awareness  Behavior: calm;cooperative    Musculoskeletal Examination                  Functional Mobility  Rolling: Independent  Supine to Sit: Independent  Scooting to EOB: Independent  Sit to Stand: Contact Guard Assist;Increased Effort  - completed multiple times t/o session from bed and chair surfaces;instructed on use of LUE for support and to facilitate safe mobility  Stand to Sit: Stand by Assist  Transfers  Bed to Chair: Nurse, learning disability  Chair to Bed: Nurse, learning disability  Device Used for Functional Transfer: hemi walker  Locomotion  Ambulation: Contact Guard Assist;with hemi walker (10' + 20')  Pattern: Step to;decreased step length  Distance Walked (ft) (Step 6,7): 20 Feet  - pt able to amb with hemiwalker, taking rests in between trials  - instructed on R/L directional changes with cumbersome AD, advised against stepping backwards for safety  - pt c/o R wrist pain despite hot pack, discussed cold as modality for pain  -devised glove from sock as barrier between skin and ice pack; held in place with brace; pt reports relief  Educated the patient to role of physical therapy, plan of care, goals of therapy and safety with mobility and ADLs with verbalized understanding.    Patient left in bedside chair with alarm and all other medical equipment in place and call bell and all personal items/needs within reach.  RN notified of session outcome.      Assessment:  Pt making progress toward goals, able to transfer with decreased assist and amb limited functional distances with hemiwalker. Pt instructed in safe use of novel AD and indication, given RUE NWB. Pt also given tool and instruction regarding pain relief modality. Pt reports "feeling better" upon completion. Will need supervision upon d/c given h/o repeated falls.           PMP Activity: Step 6 - Walks in Room  Distance Walked (ft) (Step 6,7): 20 Feet    Plan:  Treatment/Interventions: Exercise, Gait training,  Neuromuscular re-education, Functional transfer training, LE strengthening/ROM, Endurance training, Patient/family training, Equipment eval/education, Bed mobility, Compensatory technique education      PT Frequency: 2-3x/wk   Continue plan of care.    Goals:  Goals  Goal Formulation: With patient  Time for Goal Acheivement: By time of discharge  Goals: Select goal  Pt Will Go Supine To Sit: with stand by assist, to maximize functional mobility and independence, Goal met  Pt Will Perform Sit To Supine: with stand by assist, to maximize functional mobility and independence, Partly met  Pt Will Achieve Sitting Balance: 4/5 moves/returns trunkal midpoint 1-2 inches in multiple planes, to maximize functional mobility and independence, Goal met  Pt Will Perform Sit to Stand: with minimal assist, to maximize functional mobility and independence, Goal met, modified independent, New goal  Pt Will Transfer Bed/Chair: with rolling walker, with moderate assist, to maximize functional mobility and independence, Goal met, with hemi walker, modified independent, New goal  Pt Will Ambulate: 51-100 feet, with hemi walker, modified independent        DME Recommended for Discharge: Hemi walker, Hospital bed, Transport chair  Discharge Recommendation: SNF, Other(Comment) (if pt refusing, home with supervision and HHPT)    Jonna Munro, PT  PRN   07/11/2019  12:05 PM

## 2019-07-12 LAB — GLUCOSE WHOLE BLOOD - POCT
Whole Blood Glucose POCT: 240 mg/dL — ABNORMAL HIGH (ref 70–100)
Whole Blood Glucose POCT: 315 mg/dL — ABNORMAL HIGH (ref 70–100)
Whole Blood Glucose POCT: 314 mg/dL — ABNORMAL HIGH (ref 70–100)
Whole Blood Glucose POCT: 151 mg/dL — ABNORMAL HIGH (ref 70–100)

## 2019-07-12 LAB — RENAL FUNCTION PANEL
Albumin: 2.6 g/dL — ABNORMAL LOW (ref 3.5–5.0)
Anion Gap: 15 (ref 5.0–15.0)
BUN: 45 mg/dL — ABNORMAL HIGH (ref 7.0–19.0)
CO2: 24 mEq/L (ref 22–29)
Calcium: 8.6 mg/dL (ref 8.5–10.5)
Chloride: 99 mEq/L — ABNORMAL LOW (ref 100–111)
Creatinine: 5.8 mg/dL — ABNORMAL HIGH (ref 0.6–1.0)
Glucose: 220 mg/dL — ABNORMAL HIGH (ref 70–100)
Phosphorus: 4.1 mg/dL (ref 2.3–4.7)
Potassium: 4.1 mEq/L (ref 3.5–5.1)
Sodium: 138 mEq/L (ref 136–145)

## 2019-07-12 LAB — GFR: EGFR: 8.9

## 2019-07-12 LAB — CBC
Absolute NRBC: 0.02 10*3/uL — ABNORMAL HIGH (ref 0.00–0.00)
Hematocrit: 24.7 % — ABNORMAL LOW (ref 34.7–43.7)
Hgb: 7.4 g/dL — ABNORMAL LOW (ref 11.4–14.8)
MCH: 27.8 pg (ref 25.1–33.5)
MCHC: 30 g/dL — ABNORMAL LOW (ref 31.5–35.8)
MCV: 92.9 fL (ref 78.0–96.0)
MPV: 11.2 fL (ref 8.9–12.5)
Nucleated RBC: 0.2 /100 WBC — ABNORMAL HIGH (ref 0.0–0.0)
Platelets: 175 10*3/uL (ref 142–346)
RBC: 2.66 10*6/uL — ABNORMAL LOW (ref 3.90–5.10)
RDW: 14 % (ref 11–15)
WBC: 9.21 10*3/uL (ref 3.10–9.50)

## 2019-07-12 LAB — VANCOMYCIN, RANDOM: Vancomycin Random: 22.6 ug/mL

## 2019-07-12 LAB — HEMOLYSIS INDEX: Hemolysis Index: -1 — ABNORMAL LOW (ref 0–18)

## 2019-07-12 LAB — MAGNESIUM: Magnesium: 1.8 mg/dL (ref 1.6–2.6)

## 2019-07-12 MED ORDER — SODIUM CHLORIDE 0.9 % IV BOLUS
100.0000 mL | INTRAVENOUS | Status: AC | PRN
Start: 2019-07-12 — End: 2019-07-12

## 2019-07-12 MED ORDER — SODIUM CHLORIDE 0.9 % IV MBP
500.00 mg | Freq: Once | INTRAVENOUS | Status: AC
Start: 2019-07-12 — End: 2019-07-12
  Administered 2019-07-12: 21:00:00 500 mg via INTRAVENOUS
  Filled 2019-07-12: qty 500

## 2019-07-12 MED ORDER — VANCOMYCIN HCL 500 MG IV SOLR (ROUNDING INGRED)
500.00 mg | Freq: Two times a day (BID) | INTRAVENOUS | Status: DC
Start: 2019-07-12 — End: 2019-07-12
  Filled 2019-07-12: qty 500

## 2019-07-12 MED ORDER — ACETAMINOPHEN 325 MG PO TABS
650.0000 mg | ORAL_TABLET | Freq: Four times a day (QID) | ORAL | Status: DC | PRN
Start: 2019-07-12 — End: 2019-07-18
  Administered 2019-07-14: 650 mg via ORAL
  Filled 2019-07-12 (×2): qty 2

## 2019-07-12 MED ORDER — SODIUM CHLORIDE 0.9 % IV BOLUS
250.0000 mL | INTRAVENOUS | Status: AC | PRN
Start: 2019-07-12 — End: 2019-07-12

## 2019-07-12 NOTE — Progress Notes (Signed)
Pt.a/ox4; Denies any pain or discomfort; on Room air.  Pt. Had Rt. CVC exchange yesterday. Placement verified prior to use. Art. Line has resistance on aspiration.Lines flushed well prior to use. BP on the low side . Will closely monitor progress w/    07/12/19 1638   Bedside Nurse Communication   Name of bedside RN - pre dialysis Gaynelle Cage   Treatment Initiation- With Dialysis Precautions   Time Out/Safety Check Completed Yes   Consent for HD signed for this hospitalization (Date) 05/23/19   Consent for HD signed for this hospitalization (Time) 1044   Preferred language No   Blood Consent Verified N/A   Dialysis Precautions All Connections Secured;Saline Line Double Clamped   Dialysis Treatment Type Routine;Bedside   Special Considerations Contact Isolation   Is patient diabetic? Yes   RO/Hemodialysis Architectural technologist   Is Total Chlorine less than 0.1 ppm? Yes   Orignial Total Chlorine Testing Time 1635   At 4 Hour Total Chlorine Testing Time 2035   RO/Hemodialysis Machine Safety Checks    Machine Number 6   Water Hardness NA   pH 7.2   Pressure Test Verified Yes   Alarms Verified Passed   Machine Temperature 97.7 F (36.5 C)   Alarms Verified Yes   Na+ mEq (Machine) 138 mEq   Bicarb mEq (Machine) 35 mEq   Hemodialysis Conductivity (Machine) 14.3   Hemodialysis Conductivity (Meter) 14   Dialyzer Lot Number [id number]   Tubing Lot Number [id number]   RO Machine Log Completed Yes   Hepatitis Status   HBsAg (Antigen) Result Negative   HBsAg Date Drawn 07/10/19   HBsAg Repeat Draw Due Date 08/07/19   Dialysis Weight   Pre-Treatment Weight (Kg) 108.6   Scale Type ICU Bed Scale   Vitals   Temp 98.2 F (36.8 C)   Heart Rate 70   Resp Rate 16   BP 119/70   SpO2 96 %   O2 Device None (Room air)   Assessment   Mental Status Alert;Oriented;Cooperative   Cardiac (WDL) WDL   Cardiac Regularity Regular   Cardiac Symptoms None   Cardiac Rhythm Normal Sinus Rhythm   Respiratory  WDL   Respiratory Pattern Regular    Bilateral Breath Sounds Clear   R Breath Sounds Clear   L Breath Sounds Clear   Edema  WDL   Generalized Edema None   Facial None   Sacral UTA   RLE Edema None   LLE Edema None   General Skin Color Appropriate for ethnicity   Skin Condition/Temp Warm;Dry   Gastrointestinal (WDL) WDL   Abdomen Inspection Soft;Nondistended   GI Symptoms None   Mobility Ambulatory with Assistance   Pain Assessment   Charting Type Assessment   Pain Scale Used Numeric Scale (0-10)   Numeric Pain Scale   Pain Score 0   POSS Score 1   Hemodialysis Comments   Pre-Hemodialysis Comments Timeout , safety check- done   Hemodialysis History Information   Outpatient Dialysis Unit [facility]   Chronic Dialysis Patient Yes   Outpatient Nephrologist Dr. Eunice Blase    HD.

## 2019-07-12 NOTE — Progress Notes (Signed)
[facility]  PROGRESS NOTE      Patient: [name]  Date: 07/12/2019   LOS: 10 Days  Admission Date: 06/30/2019   MRN: [id number]  Attending: Adrienne Mocha  Please contact me via [place]       ASSESSMENT/PLAN     [name] is a 64 y.o. female admitted with Dizziness    Interval Summary:     Active Hospital Problems    Diagnosis    Dizziness    Fall    Open wound, abdominal wall, lateral, subsequent encounter    Uncontrolled type 2 diabetes mellitus with hyperglycemia    Hypertension    Hyperlipidemia    History of COPD    History of anemia due to chronic kidney disease    ESRD (end stage renal disease) on dialysis      Patient has been admitted with febrile illness, confusion and proneness to falls.  Patient was found to have abdominal wall cellulitis/wound.  Cultures turned positive methicillin resistant Staphylococcus aureus and Pseudomonas. aeruginosa.  Patient has been treated with combination of IV vancomycin and levofloxacin.  ID note and recommendations appreciated.  Patient also had been evaluated by plastic surgery.   Patient's lab shows anemia, hemoglobin low at 6.4 repeated hemoglobin 6.8.  Patient is on dialysis.  Considering patient's progress to falls and complaint of dizziness.  s/p 1 unit packed RBCs transfusion.       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 S/p 1 unit pRBC. Iron profile suggestive of AOCD  Transient alteration in mental status - mental status back at baseline  Essential hypertension continue amlodipine  Hyperlipidemia continue atorvastatin  Neuropathy, NOS  DM2, continue basal insulin glargine 30units daily and continue slight scale coverage with lispro  Continue  IV vancomycin and oral Levaquin  Inguinal fungal dermatitis,  on miconazole    Consultants, ID and plastic surgery notes and recommendations  appreciated     PT/OT evaluation ono 6/8 recommending SNF; patient is reluctant   Pain management: Continue as needed acetaminophen and if needed as needed oxycodone.  D/c planning to SNF vs home        Analgesia: Acetaminophen, oxycodone    Nutrition: Renal    DVT Prophylaxis: Heparin       Code Status: DNR    DISPO: home vs SNF likely in the next 1-2 days     Family Contact: Daughter                      SUBJECTIVE   Patient seen and examined.  No new complaints. Pain controlled.   Updated on plan of care.  Denies chest pain shortness breath abdominal pain nausea vomiting diarrhea       MEDICATIONS     Current Facility-Administered Medications   Medication Dose Route Frequency    amLODIPine  5 mg Oral Q12H    atorvastatin  40 mg Oral Daily    carvedilol  25 mg Oral Q12H    collagenase   Topical Q24H    darbepoetin alfa  0.45 mcg/kg Subcutaneous Weekly    gabapentin  300 mg Oral Daily    heparin (porcine)  5,000 Units Subcutaneous Q8H [place]    insulin glargine  30 Units Subcutaneous QAM    insulin lispro  1-3 Units Subcutaneous QHS    insulin lispro  1-5 Units Subcutaneous TID AC    miconazole 2 % with zinc oxide   Topical Q6H    nystatin  1 application Topical BID    pantoprazole  40 mg Oral BID    sevelamer  800 mg Oral TID MEALS    sodium hypochlorite   Irrigation Daily    vancomycin (VANCOCIN) 500 mg in NS 100 mL mini-bag plus IVPB  500 mg Intravenous Once    vancomycin   Intravenous See Admin Instructions    vitamin C  500 mg Oral Daily    zinc sulfate  220 mg Oral Daily       PHYSICAL EXAM     Vitals:    07/12/19 1201   BP: 120/71   Pulse: 68   Resp: 16   Temp: 97.5 F (36.4 C)   SpO2: 98%       Temperature: Temp  Min: 97.2 F (36.2 C)  Max: 98.8 F (37.1 C)  Pulse: Pulse  Min: 62  Max: 71  Respiratory: Resp  Min: 16  Max: 18  Non-Invasive BP: BP  Min: 100/64  Max: 120/71  Pulse Oximetry SpO2  Min: 97 %  Max: 98 %    Intake and Output Summary (Last 24 hours) at Date  Time    Intake/Output Summary (Last 24 hours) at 07/12/2019 1504  Last data filed at 07/12/2019 1300  Gross per 24 hour   Intake 1010 ml   Output 0 ml   Net 1010 ml               PHYSICAL EXAMINATION  GEN APPEARANCE: NAD.  Pleasant, alert and cooperative.    HEENT: NCAT, EOMI, PERRL, no nasal discharge.   conjunctivae/corneas clear. Oral mucosa moist.   Neck: Supple without meningismus   CVS: RRR, S1, S2. No murmur   LUNGS: CTAB; No wheezes, crackles, rhonchi or rales  ABD: BS+, soft, NT, ND, no guarding or rigidity  LUQ wound, small amount of drainage noted   EXT: No edema; Pulses 2+ and intact  SKIN: skin is warm and dry. No rash noted.  MSK: full ROM intact. No joint tenderness or swelling.   NEURO: AAOx3, CN 2-12 intact.  No focal neurological deficits  MENTAL STATUS: appropriate affect and mood        LABS     Recent Labs   Lab 07/12/19  0319 07/11/19  0500 07/10/19  0624   WBC 9.21 9.41 11.39*   RBC 2.66* 2.74* 2.63*   Hgb 7.4* 7.5* 7.4*   Hematocrit 24.7* 25.4* 24.3*   MCV 92.9 92.7 92.4   Platelets 175 177 160       Recent Labs   Lab 07/12/19  0319 07/11/19  0500 07/10/19  0624 07/09/19  0353 07/08/19  0357   Sodium 138 139 136 137 137   Potassium 4.1 4.4 4.5 4.2 4.7   Chloride 99* 101 100 101 102   CO2 24 28 24 26 23    BUN 45.0* 27.0* 45.0* 28.0* 53.0*   Creatinine 5.8* 4.4* 5.8* 4.1* 6.1*   Glucose 220* 166* 256* 170* 149*   Calcium 8.6 8.7 8.4* 7.9* 8.1*   Magnesium 1.8 1.8 1.8 1.7 1.7       Recent Labs   Lab 07/12/19  0319 07/11/19  0500 07/10/19  0624   Albumin 2.6* 2.7* 2.2*                   Microbiology Results     Procedure  Component Value Units Date/Time    COVID-19 (SARS-COV-2) [facility]) [[id number]] Collected: 06/30/19 2256    Specimen: Nasopharyngeal Swab from Nasopharynx Updated: 06/30/19 2341     Purpose of COVID testing Screening     SARS-CoV-2 Specimen Source Nasopharyngeal     SARS CoV 2 Overall Result Negative     Comment: Test performed using the Abbott ID NOW EUA assay.  Please see Fact  Sheets for patients and providers located at:  http://olson-hall.info/  This test is for the qualitative detection of SARS-CoV-2  (COVID19) nucleic acid. Viral nucleic acids may persist in vivo,  independent of viability. Detection of viral nucleic acid does  not imply the presence of infectious virus, or that virus  nucleic acid is the cause of clinical symptoms. Negative  results should be treated as presumptive and, if inconsistent  with clinical signs and symptoms or necessary for patient  management, should be tested with an alternative molecular  assay. Negative results do not preclude SARS-CoV-2 infection  and should not be used as the sole basis for patient  management decisions. Invalid results may be due to inhibiting  substances in the specimen and recollection should occur.         Narrative:      Screening    CULTURE BLOOD AEROBIC AND ANAEROBIC [[id number]] Collected: 07/01/19 2340    Specimen: Blood, Venipuncture Updated: 07/07/19 0721    Narrative:      ORDER#: [id number]                                    ORDERED BY: Genia Harold, LUBNA  SOURCE: Blood, Venipuncture                          COLLECTED:  07/01/19 23:40  ANTIBIOTICS AT COLL.:                                RECEIVED :  07/02/19 04:54  Culture Blood Aerobic and Anaerobic        FINAL       07/07/19 07:21  07/07/19   No growth after 5 days of incubation.      CULTURE BLOOD AEROBIC AND ANAEROBIC [[id number]] Collected: 07/01/19 2340    Specimen: Blood, Venipuncture Updated: 07/07/19 0721    Narrative:      ORDER#: [id number]                                    ORDERED BY: Genia Harold, LUBNA  SOURCE: Blood, Venipuncture                          COLLECTED:  07/01/19 23:40  ANTIBIOTICS AT COLL.:                                RECEIVED :  07/02/19 04:54  Culture Blood Aerobic and Anaerobic        FINAL       07/07/19 07:21  07/07/19   No growth after 5 days of incubation.      Culture + Gram Stain,Aerobic, Wound [[id number]] Collected: 07/06/19  1826    Specimen: Wound from Abscess Updated: 07/09/19 3154  Narrative:      ORDER#: [id number]                                    ORDERED BY: Rebecka Apley  SOURCE: Abscess groin                                COLLECTED:  07/06/19 18:26  ANTIBIOTICS AT COLL.:                                RECEIVED :  07/06/19 22:59  Stain, Gram                                FINAL       07/07/19 02:32  07/07/19   Few WBCs             No organisms seen  Culture and Gram Stain, Aerobic, Wound     PRELIM      07/09/19 08:50   +  07/09/19   Heavy growth of Staphylococcus aureus               Susceptibility to follow        Culture + Gram Stain,Aerobic, Wound [[id number]] Collected: 07/02/19 0009    Specimen: Wound from Discharge Updated: 07/04/19 0934    Narrative:      ORDER#: [id number]                                    ORDERED BY: Sheppard Coil  SOURCE: Discharge abdominal wound                    COLLECTED:  07/02/19 00:09  ANTIBIOTICS AT COLL.:                                RECEIVED :  07/02/19 04:57  Stain, Gram                                FINAL       07/02/19 06:02  07/02/19   Moderate WBCs             Moderate Gram positive cocci             Moderate Gram negative rods             No Squamous epithelial cells seen  Culture and Gram Stain, Aerobic, Wound     FINAL       07/04/19 09:34   +  07/04/19   Heavy growth of Pseudomonas aeruginosa      07/04/19   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      _____________________________________________________________________________                                  P.aeruginosa        MRSA        ANTIBIOTICS  MIC  INTRP      MIC  INTRP      _____________________________________________________________________________  Amikacin                        <=8    S                        Aztreonam                        8     S                        Cefepime                         4     S                        Ceftazidime                      4     S                         Ciprofloxacin                 <=0.25   S                        Clindamycin                                    <=0.5   S        Daptomycin                                      <=1    S        Erythromycin                                    >4     R        Gentamicin                       4     S        <=2    S        Levofloxacin                   <=0.5   S        >4     R        Linezolid                                       <=1    S        Meropenem                      <=0.5   S  Oxacillin                                       >2     R  D1    Penicillin                                      >1     R        Piperacillin/Tazobactam         8/4    S                        Rifampin                                       <=0.5   S        Tetracycline                                   <=0.5   S        Tobramycin                      <=2    S                        Trimethoprim/Sulfamethoxazole                 <=0.5/9  S        Vancomycin                                       1     S          -----DRUG COMMENTS----------    D1:  Oxacillin sensitivity predicts sensitivity to cephalosporins.         [place] [employer] 2016  _____________________________________________________________________________            S=SUSCEPTIBLE     I=INTERMEDIATE     R=RESISTANT                            N/S=NON-SUSCEPTIBLE  _____________________________________________________________________________             No results found.      Echo Results     None          RADIOLOGY     Upon my review:    Signed,  Adrienne Mocha       This chart was generated using hospital voice-recognition software which does not employ spell-checking or grammar-checking features. It was dictated, all or in part, in a busy and often noisy patient care environment. I have taken all usual measures to dictate carefully and to review all aspects this chart. Nonetheless, given the known and well-documented  performance characteristics of VR software in such patient care environments, this dictation still may contain unrecognized and wholly unintended errors or omissions.

## 2019-07-12 NOTE — Consults (Signed)
Pharmacy Note: Vancomycin Dosing & MONITORING        Height Weight Renal Function   1.575 m (5\' 2" ) 111.2 kg (245 lb 2.4 oz) Serum creatinine: 5.8 mg/dL (H) 07/12/19 0319  Estimated creatinine clearance: 11.5 mL/min (A)     98.1 F (36.7 C) (Oral)   Recent Labs   Lab 07/12/19  0319 07/11/19  0500 07/10/19  0624 07/09/19  0353 07/08/19  0357 07/07/19  0426 07/07/19  0426   WBC 9.21 9.41 11.39* 12.33* 14.31*  More results in Results Review 9.07   Creatinine 5.8* 4.4* 5.8* 4.1* 6.1*  More results in Results Review 5.2*   Vancomycin Random 22.6  --  17.8  --   --   --  29.5   More results in Results Review = values in this interval not displayed.       Day of Therapy: 9  Vancomycin Indication: Wound infection  Current regimen: dose by Pre HD level  Goal Trough: Pre-HD 15-25 mcg/mL        Pertinent Cultures:   Wound cx heavy MRSA, pseudomonas    Other Antimicrobials: Levofloxacin  Other Nephrotoxic Agents/Conditions:  None    Assessment/Plan:  1. Renal function: HD TTS  2. Trough/random level assessment: 22.6 mcg/ml w/n therapeutic level  3. Vancomycin dosing plan: 500 mg IV once after dialysis  4. Continue to monitor renal function, WBC, temp, microbiologic data, missed doses, and signs/symptoms of adverse reaction.      Thank you for this consult. Pharmacy will continue to follow this patient's progress with you until consult and/or vancomycin is discontinued.      Joesph Fillers, Pharm.D.  Phone: [phone]

## 2019-07-12 NOTE — Plan of Care (Addendum)
Patient alert and oriented X4, able to verbalized needs and call as needed. Patient on room air andnotelemetry.Patientable to walk the bathroom with walker,Bed linen and gown changed.Abd wound dressing clean, dry and intact.Fall and safety precautions implemented      Problem: Pain  Goal: Pain at adequate level as identified by patient  Outcome: Progressing     Problem: Safety  Goal: Patient will be free from injury during hospitalization  Outcome: Progressing  Goal: Patient will be free from infection during hospitalization  Outcome: Progressing     Problem: Pain  Goal: Pain at adequate level as identified by patient  Outcome: Progressing     Problem: Infection  Goal: Free from infection  Outcome: Progressing     Problem: Renal Instability  Goal: Fluid and electrolyte balance are achieved/maintained  Outcome: Progressing  Goal: Free from infection  Outcome: Progressing     Problem: Discharge Barriers  Goal: Patient will be discharged home or other facility with appropriate resources  Outcome: Progressing

## 2019-07-12 NOTE — Progress Notes (Signed)
3.5Hrs. HD tx. Completed ; Net fluid off 2.9L; Rt. CVC- w/ fair flow; Max.flow achieved 300BFR. C/O feeling tired towards the end of tx. ; VSS. Report given to Julien Girt RN.   07/12/19 2030   Treatment Summary   Time Off Machine 2030   Duration of Treatment (Hours) 3.5   Treatment Type 1:1   Dialyzer Clearance Moderately streaked   Fluid Volume Off (mL) 3400   Prime Volume (mL) 200   Rinseback Volume (mL) 200   Fluid Given: Normal Saline (mL) 100   Fluid Given: PRBC  0 mL   Fluid Given: Albumin (mL) 0   Fluid Given: Other (mL) 0   Total Fluid Given 500   Hemodialysis Net Fluid Removed 2900   Post Treatment Assessment   Post-Treatment Weight (Kg) -2.9   Patient Response to Treatment tolerated   Additional Dialyzer Used 0   Permacath Catheter - Tunneled 07/03/19 Right   Placement Date/Time: 07/03/19 1335   Inserted by: Lavone Neri MD  Orientation: Right  Catheter Tip Location: (c) SVC  Central Line Infection Prevention Education provided?: Yes  Hand Hygiene: Chlorhexidine  Line cart used?: No  Line kit used?: Yes  Maximum ...   Line necessity reviewed? Apheresis/hemodialysis   Catheter Lumen Volume Venous 1.7 mL   Catheter Lumen Volume Arterial 1.7 mL   Dressing Status and Intervention Dressing Intact   Tego/Curos Caps on Catheter Yes   NEW Tego/Curos Caps placed (Date) 07/11/19   End Caps Free From Blood Yes   Line Care Connections checked and tightened   Dressing Type Transparent;Biopatch   Dressing Status Clean;Dry;Intact   Dressing/Line Intervention Caps changed;Dressing reinforced   Line Used For Blood Draw No   Dressing Change Due 07/17/19   Vitals   Temp 97.7 F (36.5 C)   Heart Rate 61   Resp Rate 17   BP 114/73   SpO2 98 %   O2 Device None (Room air)   Assessment   Mental Status Alert;Oriented;Cooperative   Cardiac (WDL) WDL   Cardiac Regularity Regular   Cardiac Symptoms None   Cardiac Rhythm Normal Sinus Rhythm   Respiratory  WDL   Respiratory Pattern Regular   Bilateral Breath Sounds Clear   R Breath  Sounds Clear   L Breath Sounds Clear   Edema  WDL   Generalized Edema None   Facial None   Sacral UTA   RLE Edema None   LLE Edema None   General Skin Color Appropriate for ethnicity   Skin Condition/Temp Warm;Dry   Gastrointestinal (WDL) WDL   Abdomen Inspection Soft;Nondistended   GI Symptoms None   Mobility Ambulatory with Assistance   Pain Assessment   Charting Type Reassessment   Pain Scale Used Numeric Scale (0-10)   Numeric Pain Scale   Pain Score 0   POSS Score 1   Pain Location Arm   Education   Person taught Patient   Knowledge basis Minimal   Topics taught Access care   Teaching Tools Explain   [name] Understanding   Bedside Nurse Communication   Name of bedside RN - post dialysis Julien Girt

## 2019-07-12 NOTE — Plan of Care (Signed)
Problem: Renal Instability  Goal: Fluid and electrolyte balance are achieved/maintained  Flowsheets (Taken 07/12/2019 2123)  Fluid and electrolyte balance are achieved/maintained:   Monitor intake and output every shift   Monitor/assess lab values and report abnormal values   Provide adequate hydration   Assess and reassess fluid and electrolyte status   Observe for cardiac arrhythmias   Assess for confusion/personality changes  Goal: Free from infection  Flowsheets (Taken 07/12/2019 2123)  Free from infection: Monitor/assess for signs and symptoms of infection     Problem: Patient Receiving Advanced Renal Therapies  Goal: Therapy access site remains intact  Flowsheets (Taken 07/12/2019 2123)  Therapy access site remains intact:   Assess therapy access site   Change therapy access site dressing as needed

## 2019-07-12 NOTE — Plan of Care (Signed)
Problem: Pain  Goal: Pain at adequate level as identified by patient  Outcome: Progressing  Flowsheets (Taken 07/10/2019 0820 by Ezequiel Essex, RN)  Pain at adequate level as identified by patient:   Identify patient comfort function goal   Assess for risk of opioid induced respiratory depression, including snoring/sleep apnea. Alert healthcare team of risk factors identified.   Assess pain on admission, during daily assessment and/or before any "as needed" intervention(s)   Reassess pain within 30-60 minutes of any procedure/intervention, per Pain Assessment, Intervention, Reassessment (AIR) Cycle   Evaluate if patient comfort function goal is met   Evaluate patient's satisfaction with pain management progress     Problem: Safety  Goal: Patient will be free from injury during hospitalization  Outcome: Progressing  Flowsheets (Taken 07/09/2019 0704 by Ezequiel Essex, RN)  Patient will be free from injury during hospitalization:   Assess patient's risk for falls and implement fall prevention plan of care per policy   Use appropriate transfer methods   Ensure appropriate safety devices are available at the bedside   Include patient/ family/ care giver in decisions related to safety   Provide and maintain safe environment   Hourly rounding     Problem: Compromised Tissue integrity  Goal: Damaged tissue is healing and protected  Outcome: Progressing  Flowsheets (Taken 07/09/2019 0704 by Ezequiel Essex, RN)  Damaged tissue is healing and protected:   Monitor/assess Braden scale every shift   Reposition patient every 2 hours and as needed unless able to reposition self   Provide wound care per wound care algorithm   Increase activity as tolerated/progressive mobility   Avoid shearing injuries   Keep intact skin clean and dry   Use bath wipes, not soap and water, for daily bathing   Relieve pressure to bony prominences for patients at moderate and high risk     Problem: Nutrition  Goal: Nutritional intake is  adequate  Outcome: Progressing  Flowsheets (Taken 07/01/2019 1336 by Mickel Baas, RN)  Nutritional intake is adequate:   Monitor daily weights   Consult/collaborate with Clinical Nutritionist   Assess anorexia, appetite, and amount of meal/food tolerated   Include patient/patient care companion in decisions related to nutrition    Patient alert and oriented x4. Patient denies SOB, dizziness. Scheduled medications. Fall bundle in place. Will continue to monitor.

## 2019-07-12 NOTE — Progress Notes (Signed)
[facility] PROGRESS NOTE  Aaron Edelman, x [phone] ([place])      Date Time: 07/12/19 6:36 AM  Patient Name: [name]  Attending Physician: Adrienne Mocha, MD    CC: follow-up CKD    Assessment:     1. ESRD- on HD at [facility]  2. Volume overload- mild  3. HTN  4. Anemia- Hb stable at 7.4 (TSAT 22%)  5. Secondary HPT  6. Abdominal surgical wound- not healing, MRSA and Pseudomonas  7. Recurrent falls      Recommendations:     Hemodialysis today  Stable for discharge after dialysis      Case discussed with: pt, HD RN    Armando Reichert, MD  [facility]  [phone] (office)  X [phone] ([facility])      Subjective: No complaints today    Review of Systems:   Cardio: no chest pain, no edema  Pulm: no SOB  GI: no nausea, vomiting or diarrhea    Physical Exam:     Vitals:    07/11/19 1925 07/11/19 2300 07/12/19 0311 07/12/19 0543   BP: 103/64 100/64 114/65 114/65   Pulse: 68 71 68 68   Resp: 18 16 18     Temp: 98.6 F (37 C) 98.8 F (37.1 C) 97.7 F (36.5 C)    TempSrc: Oral Oral Oral    SpO2: 98% 97% 98%    Weight:   111.2 kg (245 lb 2.4 oz)    Height:           Intake and Output Summary (Last 24 hours) at Date Time    Intake/Output Summary (Last 24 hours) at 07/12/2019 0636  Last data filed at 07/12/2019 0600  Gross per 24 hour   Intake 1180 ml   Output 0 ml   Net 1180 ml     General: awake, alert, oriented x 3, no acute distress.  Cardiovascular: regular rate and rhythm  Lungs: clear to auscultation bilaterally, bilateral air entry, normal work of breathing  Abdomen: soft; LLQ wound bandaged  Extremities: trace edema  Other: RIJ Permcath    Meds:      Scheduled Meds: PRN Meds:    acetaminophen, 1,000 mg, Oral, TID  amLODIPine, 5 mg, Oral, Q12H  atorvastatin, 40 mg, Oral, Daily  carvedilol, 25 mg, Oral, Q12H  collagenase, , Topical, Q24H  darbepoetin alfa, 0.45 mcg/kg, Subcutaneous, Weekly  gabapentin, 300 mg, Oral, Daily  heparin (porcine), 5,000 Units,  Subcutaneous, Q8H SCH  insulin glargine, 30 Units, Subcutaneous, QAM  insulin lispro, 1-3 Units, Subcutaneous, QHS  insulin lispro, 1-5 Units, Subcutaneous, TID AC  miconazole 2 % with zinc oxide, , Topical, Q6H  nystatin, 1 application, Topical, BID  pantoprazole, 40 mg, Oral, BID  sevelamer, 800 mg, Oral, TID MEALS  sodium hypochlorite, , Irrigation, Daily  vancomycin, , Intravenous, See Admin Instructions  vitamin C, 500 mg, Oral, Daily  zinc sulfate, 220 mg, Oral, Daily          Continuous Infusions:   sodium chloride, , PRN  dextrose, 15 g of glucose, PRN   And  dextrose, 12.5 g, PRN   And  glucagon (rDNA), 1 mg, PRN  melatonin, 3 mg, QHS PRN  naloxone, 0.2 mg, PRN  ondansetron, 4 mg, Q6H PRN   Or  ondansetron, 4 mg, Q6H PRN  oxyCODONE, 5 mg, Q6H PRN              Labs:     Recent Labs   Lab 07/12/19  0319 07/11/19  0500 07/10/19  0624   WBC 9.21 9.41 11.39*   Hgb 7.4* 7.5* 7.4*   Hematocrit 24.7* 25.4* 24.3*   Platelets 175 177 160     Recent Labs   Lab 07/12/19  0319 07/11/19  0500 07/10/19  0624   Sodium 138 139 136   Potassium 4.1 4.4 4.5   Chloride 99* 101 100   CO2 24 28 24    BUN 45.0* 27.0* 45.0*   Creatinine 5.8* 4.4* 5.8*   Calcium 8.6 8.7 8.4*   Albumin 2.6* 2.7* 2.2*   Phosphorus 4.1 3.8 4.2   Magnesium 1.8 1.8 1.8   Glucose 220* 166* 256*   EGFR 8.9 12.2 8.9             Signed by: Armando Reichert, MD

## 2019-07-13 LAB — CBC
Absolute NRBC: 0 10*3/uL (ref 0.00–0.00)
Hematocrit: 26.1 % — ABNORMAL LOW (ref 34.7–43.7)
Hgb: 7.8 g/dL — ABNORMAL LOW (ref 11.4–14.8)
MCH: 27.5 pg (ref 25.1–33.5)
MCHC: 29.9 g/dL — ABNORMAL LOW (ref 31.5–35.8)
MCV: 91.9 fL (ref 78.0–96.0)
MPV: 10.5 fL (ref 8.9–12.5)
Nucleated RBC: 0 /100 WBC (ref 0.0–0.0)
Platelets: 176 10*3/uL (ref 142–346)
RBC: 2.84 10*6/uL — ABNORMAL LOW (ref 3.90–5.10)
RDW: 14 % (ref 11–15)
WBC: 11.43 10*3/uL — ABNORMAL HIGH (ref 3.10–9.50)

## 2019-07-13 LAB — RENAL FUNCTION PANEL
Albumin: 2.8 g/dL — ABNORMAL LOW (ref 3.5–5.0)
Anion Gap: 10 (ref 5.0–15.0)
BUN: 32 mg/dL — ABNORMAL HIGH (ref 7.0–19.0)
CO2: 26 mEq/L (ref 22–29)
Calcium: 8.8 mg/dL (ref 8.5–10.5)
Chloride: 101 mEq/L (ref 100–111)
Creatinine: 4.4 mg/dL — ABNORMAL HIGH (ref 0.6–1.0)
Glucose: 232 mg/dL — ABNORMAL HIGH (ref 70–100)
Phosphorus: 3.6 mg/dL (ref 2.3–4.7)
Potassium: 4.4 mEq/L (ref 3.5–5.1)
Sodium: 137 mEq/L (ref 136–145)

## 2019-07-13 LAB — GLUCOSE WHOLE BLOOD - POCT
Whole Blood Glucose POCT: 188 mg/dL — ABNORMAL HIGH (ref 70–100)
Whole Blood Glucose POCT: 202 mg/dL — ABNORMAL HIGH (ref 70–100)
Whole Blood Glucose POCT: 233 mg/dL — ABNORMAL HIGH (ref 70–100)
Whole Blood Glucose POCT: 371 mg/dL — ABNORMAL HIGH (ref 70–100)

## 2019-07-13 LAB — GFR: EGFR: 12.2

## 2019-07-13 LAB — CROSSMATCH PRBC, 1 UNIT
Expiration Date: 202107122359
ISBT CODE: 8400
UTYPE: AB POS

## 2019-07-13 LAB — MAGNESIUM: Magnesium: 1.8 mg/dL (ref 1.6–2.6)

## 2019-07-13 LAB — HEMOLYSIS INDEX: Hemolysis Index: 6 (ref 0–18)

## 2019-07-13 MED ORDER — CIPROFLOXACIN HCL 500 MG PO TABS
250.0000 mg | ORAL_TABLET | Freq: Every day | ORAL | Status: DC
Start: 2019-07-13 — End: 2019-07-15
  Administered 2019-07-13 – 2019-07-14 (×2): 250 mg via ORAL
  Filled 2019-07-13 (×2): qty 1

## 2019-07-13 NOTE — Plan of Care (Signed)
Discharge Recommendation: SNF (Home with HHOT with continues to progress)   DME Recommended for Discharge: [facility], Shower chair    OT Frequency Recommended: 2-3x/wk     Is PT evaluation indicated at this time? This patient is already on PT caseload.     PMP Activity: Step 5 - Chair  Distance Walked (ft) (Step 6,7): 0 Feet  (Please See Therapy Evaluation for device and assistance level needed)    Treatment Interventions: ADL retraining, Functional transfer training, Endurance training, Patient/Family training, Equipment eval/education, Continued evaluation       Goal Formulation: Patient  Time For Goal Achievement: by time of discharge  ADL Goals  Patient will groom self: Supervision, at edge of bed, by time of discharge, Partly met (SUP at EOB)  Patient will dress upper body: Supervision, Not met  Patient will dress lower body: Contact Guard Assist, with AE, Not met  Patient will toilet: Moderate Assist, Not met  Mobility and Transfer Goals  Pt will perform functional transfers: Supervision, by time of discharge, Partly met (CGA)  Other Goal: Pt will tolerate OOB ADL and functional mobility trial within 2 visits in prep for safe [place] planning (continue; Pt getting OOB more frequently and ambulating )  Other Goal #2: Pt will amb to and from bathroom w FWW/Supv (not met)         Dannial Monarch, OTR/L    Physical Medicine and [facility]  [phone] (PRN # changes daily)    07/13/2019  3:41 PM

## 2019-07-13 NOTE — Plan of Care (Signed)
Problem: Pain  Goal: Pain at adequate level as identified by patient  Outcome: Progressing  Flowsheets (Taken 07/10/2019 0820 by Ezequiel Essex, RN)  Pain at adequate level as identified by patient:   Identify patient comfort function goal   Assess for risk of opioid induced respiratory depression, including snoring/sleep apnea. Alert healthcare team of risk factors identified.   Assess pain on admission, during daily assessment and/or before any "as needed" intervention(s)   Reassess pain within 30-60 minutes of any procedure/intervention, per Pain Assessment, Intervention, Reassessment (AIR) Cycle   Evaluate if patient comfort function goal is met   Evaluate patient's satisfaction with pain management progress     Problem: Safety  Goal: Patient will be free from injury during hospitalization  Outcome: Progressing  Goal: Patient will be free from infection during hospitalization  Outcome: Progressing  Flowsheets (Taken 07/08/2019 2041 by Maximino Sarin, RN)  Free from Infection during hospitalization:   Assess and monitor for signs and symptoms of infection   Monitor lab/diagnostic results   Monitor all insertion sites (i.e. indwelling lines, tubes, urinary catheters, and drains)     Problem: Pain  Goal: Pain at adequate level as identified by patient  Outcome: Progressing  Flowsheets (Taken 07/10/2019 0820 by Ezequiel Essex, RN)  Pain at adequate level as identified by patient:   Identify patient comfort function goal   Assess for risk of opioid induced respiratory depression, including snoring/sleep apnea. Alert healthcare team of risk factors identified.   Assess pain on admission, during daily assessment and/or before any "as needed" intervention(s)   Reassess pain within 30-60 minutes of any procedure/intervention, per Pain Assessment, Intervention, Reassessment (AIR) Cycle   Evaluate if patient comfort function goal is met   Evaluate patient's satisfaction with pain management progress     Problem:  Compromised Tissue integrity  Goal: Damaged tissue is healing and protected  Outcome: Progressing  Flowsheets (Taken 07/09/2019 0704 by Ezequiel Essex, RN)  Damaged tissue is healing and protected:   Monitor/assess Braden scale every shift   Reposition patient every 2 hours and as needed unless able to reposition self   Provide wound care per wound care algorithm   Increase activity as tolerated/progressive mobility   Avoid shearing injuries   Keep intact skin clean and dry   Use bath wipes, not soap and water, for daily bathing   Relieve pressure to bony prominences for patients at moderate and high risk     Problem: Diabetes: Glucose Imbalance  Goal: Blood glucose stable at established goal  Outcome: Progressing  Flowsheets (Taken 07/06/2019 1239 by Venia Minks, RN)  Blood glucose stable at established goal:   Monitor lab values   Monitor intake and output.  Notify LIP if urine output is < 30 mL/hour.   Include patient/family in decisions related to nutrition/dietary selections   Follow fluid restrictions/IV/PO parameters   Assess for hypoglycemia /hyperglycemia   Monitor/assess vital signs   Coordinate medication administration with meals, as indicated   Ensure appropriate diet and assess tolerance   Ensure adequate hydration   Ensure appropriate consults are obtained (Nutrition, Diabetes Education, and Case Management)   Ensure patient/family has adequate teaching materials     Problem: Renal Instability  Goal: Fluid and electrolyte balance are achieved/maintained  Outcome: Progressing  Flowsheets (Taken 07/12/2019 2123 by Mickel Baas, RN)  Fluid and electrolyte balance are achieved/maintained:   Monitor intake and output every shift   Monitor/assess lab values and report abnormal values   Provide adequate hydration  Assess and reassess fluid and electrolyte status   Observe for cardiac arrhythmias   Assess for confusion/personality changes  Goal: Free from infection  Outcome: Progressing  Flowsheets  (Taken 07/12/2019 2123 by Mickel Baas, RN)  Free from infection: Monitor/assess for signs and symptoms of infection

## 2019-07-13 NOTE — Plan of Care (Signed)
Problem: Pain  Goal: Pain at adequate level as identified by patient  Flowsheets (Taken 07/10/2019 0820 by Ezequiel Essex, RN)  Pain at adequate level as identified by patient:   Identify patient comfort function goal   Assess for risk of opioid induced respiratory depression, including snoring/sleep apnea. Alert healthcare team of risk factors identified.   Assess pain on admission, during daily assessment and/or before any "as needed" intervention(s)   Reassess pain within 30-60 minutes of any procedure/intervention, per Pain Assessment, Intervention, Reassessment (AIR) Cycle   Evaluate if patient comfort function goal is met   Evaluate patient's satisfaction with pain management progress   Note' pt alert and oriented x4. Skin warm and dry. Respirations eupnea. No c/o sob, cough or dyspnea. No c/o chest pressure or tightness. Abd soft and non-distended. No c/o n/v. Right hand with brace on and intact. C/o pain 5/10. Tx rendered to area on upper left abd. Plan; pt was medicated with oxycodone 5 mg. Will continue to monitor pt's pain status and any acute changes.

## 2019-07-13 NOTE — OT Progress Note (Signed)
[facility] [address]  Occupational Therapy Treatment     Patient: [name]     MRN#: [id number]   Unit: [address] [facility]  Bed: A2314/A2314.01    Time of treatment:  Time Calculation  OT Received On: 07/13/19  Start Time: 9629  Stop Time: 1521  Time Calculation (min): 25 min             Chart Review and Collaboration with Care Team: 10 minutes, not included in above time.    OT Visit Number: 3    Precautions and Contraindications:    Precautions  Weight Bearing Status: RUE non weight bearing  Other Precautions: contact MRSA    Personal Protective Equipment (PPE)  gloves, procedure gown, procedure mask and eye shield/covering    Updated Labs:  Lab Results   Component Value Date/Time    HGB 7.8 (L) 07/13/2019 06:00 AM    HCT 26.1 (L) 07/13/2019 06:00 AM    K 4.4 07/13/2019 06:00 AM    NA 137 07/13/2019 06:00 AM    INR 1.1 07/01/2019 10:41 PM    TROPI 0.02 06/23/2019 12:29 PM    TROPI 0.04 05/17/2019 06:20 PM    TROPI 0.05 05/17/2019 01:37 PM    TROPI 0.01 03/18/2006 06:10 PM       All imaging reviewed, please see chart for details.    Subjective:    .  "I'm sorry I couldn't do more."       Patient's medical condition is appropriate for Occupational Therapy intervention at this time.  Patient is agreeable to participation in the therapy session.  Pain Assessment  Pain Assessment: Numeric Scale (0-10)  Pain Score: 8-severe pain  Pain Location: Wrist  Pain Orientation: Right  Pain Descriptors: Aching  Pain Intervention(s): Medication (See eMAR)      Objective:  Observation of Patient/Vital Signs:  Stable    Cognition/Neuro Status  Behavior: calm;cooperative    Functional Mobility  Supine to Sit Transfers: Contact Guard Assist  Sit to Supine Transfers: Contact Guard Assist  Sit to Stand Transfers: Nurse, learning disability  Stand to Sit Transfers: Contact Guard Assist    Self-care and Home Management  Grooming: Supervision;edge of bed;teeth care    Therapeutic Exercises  Elbow AROM:  Flexion;Extension  Hand ROM: finger opposition;thumb flexion;grasp and release         Educated the patient to role of occupational therapy, plan of care, goals of therapy and safety with mobility and ADLs with verbalized understanding.     Patient left in bed with alarm and all other medical equipment in place and call bell and all personal items/needs within reach.  RN notified of session outcome.      Assessment:  Pt agreeable to participate in OT session. Pt performed supine to sit with CGA. Once sitting EOB, Pt reported feeling dizzy. BP 99/67. Pt rested and dizziness resolved. Pt performed oral hygiene sitting EOB with sup. Pt performed STS transfer with CGA and participated in static standing exercises. Pt maintained NWB to RUE during task. Pt began to feel dizzy again. Pt performed sit to supine with CGA. BP 118/59. Therapist informed RN of dizziness after mobility. Dizziness resolved while long sitting in bed. Therapist instructed Pt in AROM exercises for RUE to prevent contracture. Pt would benefit from continued OT services to increase independence in ADLs as well as functional mobility.       PMP Activity: Step 5 - Chair  Distance Walked (ft) (Step 6,7): 0 Feet  Plan:  Goal Formulation: Patient      Time For Goal Achievement: by time of discharge  ADL Goals  Patient will groom self: Supervision, at edge of bed, by time of discharge, Partly met (SUP at EOB)  Patient will dress upper body: Supervision, Not met  Patient will dress lower body: Contact Guard Assist, with AE, Not met  Patient will toilet: Moderate Assist, Not met  Mobility and Transfer Goals  Pt will perform functional transfers: Supervision, by time of discharge, Partly met (CGA)  Other Goal: Pt will tolerate OOB ADL and functional mobility trial within 2 visits in prep for safe [place] planning (continue; Pt getting OOB more frequently and ambulating )  Other Goal #2: Pt will amb to and from bathroom w FWW/Supv (not met)                        Continue plan of care.       DME Recommended for Discharge: [facility], [place] chair  Discharge Recommendation: SNF (Home with [place] with continues to progress)      Dannial Monarch, OTR/L    Physical Medicine and [facility]  [phone] (PRN # changes daily)    07/13/2019  3:35 PM

## 2019-07-13 NOTE — Progress Notes (Signed)
Medicine Nursing Note    Date Time: 07/13/19 4:42 AM  Patient Name: [name]  Attending Physician: Adrienne Mocha, MD    Assessment     Shift event: Pain Management, IV administration   Neuro: A&O x 4, Calm and Cooperative    Tele/CV/CP No Tele, VSS, RA, Afebrile   Foley/GU N/A Pt voiding - External Catheter   BMs/GI Last BM 06/19, on Renal 80 Gm Protein Restriction   Skin: Surgical Incision Abdomen, Wound Right Thigh, MASD Perineum,    Isolation: N/A   Pain: Denied pain/discomfort   MEWS: 1   Central Line: Tunneled Permacath for Hemodialysis Right Chest   Safety:  Placed High fall risk. Safety maintained, purposeful rounding performed, Bed Alarm Activated, Call bell within reach.    Interpreter: Pt speaks English and denies interpreter needs.   Comments:  Pt c/o pain and PRN medication given.   Medications given per order.   IV site assessed, flushed, and saline-locked    Provided comfort including offers of repositioning, pain medication and heating pad.   Oral fluids offered. Hygiene supply provided.     No c/o Chest Pain  No c/o Cough and SOB.  No acute distress noted with breathing.  Denied Nausea and Vomiting.       The whiteboard was updated with current team information.   The belongings are at bedside.   Discussed and explained the care plan for the night.   Explained and educated Pt on safety precaution.

## 2019-07-13 NOTE — Progress Notes (Addendum)
[facility]  PROGRESS NOTE      Patient: [name]  Date: 07/13/2019   LOS: 11 Days  Admission Date: 06/30/2019   MRN: [id number]  Attending: Elodia Florence Man Magaline Steinberg  Please contact me via Secure Chat       ASSESSMENT/PLAN     [name] is a 64 y.o. female admitted with Dizziness    Interval Summary:     Active Hospital Problems    Diagnosis    Dizziness    Fall    Open wound, abdominal wall, lateral, subsequent encounter    Uncontrolled type 2 diabetes mellitus with hyperglycemia    Hypertension    Hyperlipidemia    History of COPD    History of anemia due to chronic kidney disease    ESRD (end stage renal disease) on dialysis      Patient has been admitted with febrile illness, confusion and proneness to falls.  Patient was found to have abdominal wall cellulitis/wound.  Cultures turned positive methicillin resistant Staphylococcus aureus and Pseudomonas. aeruginosa.  Patient has been treated with combination of IV vancomycin and levofloxacin.  ID note and recommendations appreciated.  Patient also had been evaluated by plastic surgery.   Patient's lab shows anemia, hemoglobin low at 6.4 repeated hemoglobin 6.8.  Patient is on dialysis.  Considering patient's progress to falls and complaint of dizziness.  s/p 1 unit packed RBCs transfusion.         Left upper quadrant abdominal wall cellulitis   - due to MRSA  - Continue  IV vancomycin and oral Levaquin  - follow ID and plastic surgery team  - paged primary plastic surgery: Dr. Joan Flores ([phone])  - on Pain management: Acetaminophen and Oxycodone  - no fever, has mild leukocytosis  - wound culture on 6/9--> MRSA and pseudomonas  - wound culture on 6/13--> MRSA and Enterococcal fecalis  - Blood culture on 6/8--> NGTD  - as per ID attending: discharge on 5 days of p.o. Cipro 250 mg daily, doxycycline 100 mg p.o. 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 Iron profile suggestive of AOCD    H/o HTN, HLP  - on continue Amlodipine, Coreg, Atorvastatin      Diet: Renal  DVT Prophylaxis: Heparin  Code Status: DNR          DISPO: home vs SNF likely in the next 1-2 days     Family Contact: Daughter                SUBJECTIVE   Patient seen and examined.  No new complaints. Pain controlled. Updated on plan of care.  Denies chest pain shortness breath abdominal pain nausea vomiting diarrhea     6/20.  She is waiting for transportation to go home in a.m.  She did not have any new acute events.  She has  been on wound care for abdominal wall cellulitis.  Denies fever, chills, nausea, vomiting, chest pain or short of breath.  She is comfortably is laying in bed.     MEDICATIONS     Current Facility-Administered Medications   Medication Dose Route Frequency    amLODIPine  5 mg Oral Q12H    atorvastatin  40 mg Oral Daily    carvedilol  25 mg Oral Q12H    collagenase   Topical Q24H    darbepoetin alfa  0.45 mcg/kg Subcutaneous Weekly    gabapentin  300 mg Oral Daily    heparin (porcine)  5,000 Units Subcutaneous Q8H [place]    insulin glargine  30 Units Subcutaneous QAM    insulin lispro  1-3 Units Subcutaneous QHS    insulin lispro  1-5 Units Subcutaneous TID AC    miconazole 2 % with zinc oxide   Topical Q6H    nystatin  1 application Topical BID    pantoprazole  40 mg Oral BID    sevelamer  800 mg Oral TID MEALS    sodium hypochlorite   Irrigation Daily    vancomycin   Intravenous See Admin Instructions    vitamin C  500 mg Oral Daily    zinc sulfate  220 mg Oral  Daily       PHYSICAL EXAM     Vitals:    07/13/19 1203   BP: 98/61   Pulse: 69   Resp: 16   Temp: 98.4 F (36.9 C)   SpO2: 95%       Temperature: Temp  Min: 97.7 F (36.5 C)  Max: 98.6 F (37 C)  Pulse: Pulse  Min: 61  Max: 72  Respiratory: Resp  Min: 16  Max: 18  Non-Invasive BP: BP  Min: 98/61  Max: 128/77  Pulse Oximetry SpO2  Min: 95 %  Max: 100 %    Intake and Output Summary (Last 24 hours) at Date Time    Intake/Output Summary (Last 24 hours) at 07/13/2019 1211  Last data filed at 07/13/2019 0600  Gross per 24 hour   Intake 620 ml   Output 3150 ml   Net -2530 ml               PHYSICAL EXAMINATION  GEN APPEARANCE: NAD.  Pleasant, alert and cooperative.    HEENT: NCAT, EOMI, PERRL, no nasal discharge.   conjunctivae/corneas clear. Oral mucosa moist. Pallor+, no icterus  Neck: Supple without meningismus, no mass/JVD  CVS: RRR, S1, S2. No murmur   LUNGS: CTAB; No wheezes, crackles, rhonchi or rales. HD catheter in anterior chest wall- site- clean and dry.  ABD: BS+, soft, NT, ND, no guarding or rigidity  LUQ wound, small amount of drainage noted   EXT: No edema; Pulses 2+ and intact  SKIN: skin is warm and dry. No rash noted.  MSK: full ROM intact. No joint tenderness or swelling.   NEURO: AAOx3, CN 2-12 intact.  No focal neurological deficits  MENTAL STATUS: appropriate affect and mood        LABS     Recent Labs   Lab 07/13/19  0600 07/12/19  0319 07/11/19  0500   WBC 11.43* 9.21 9.41   RBC 2.84* 2.66* 2.74*   Hgb 7.8* 7.4* 7.5*   Hematocrit 26.1* 24.7* 25.4*   MCV 91.9 92.9 92.7   Platelets 176 175 177       Recent Labs   Lab 07/13/19  0600 07/12/19  0319 07/11/19  0500 07/10/19  0624 07/09/19  0353   Sodium 137 138 139 136 137   Potassium 4.4 4.1 4.4 4.5 4.2   Chloride 101 99* 101 100 101   CO2 26 24 28 24 26    BUN 32.0* 45.0* 27.0* 45.0* 28.0*   Creatinine 4.4* 5.8* 4.4* 5.8* 4.1*   Glucose 232* 220* 166* 256* 170*   Calcium 8.8 8.6 8.7 8.4* 7.9*   Magnesium 1.8 1.8 1.8 1.8 1.7       Recent Labs   Lab  07/13/19  0600 07/12/19  0319 07/11/19  0500   Albumin 2.8* 2.6* 2.7*                   Microbiology Results     Procedure Component Value Units Date/Time    COVID-19 (SARS-COV-2) [employer]) [[id number]] Collected: 06/30/19 2256    Specimen: Nasopharyngeal Swab from Nasopharynx Updated: 06/30/19 2341     Purpose of COVID testing Screening     SARS-CoV-2 Specimen Source Nasopharyngeal     SARS CoV 2 Overall Result Negative     Comment: Test performed using the Abbott ID NOW EUA assay.  Please see Fact Sheets for patients and providers located at:  http://olson-hall.info/  This test is for the qualitative detection of SARS-CoV-2  (COVID19) nucleic acid. Viral nucleic acids may persist in vivo,  independent of viability. Detection of viral nucleic acid does  not imply the presence of infectious virus, or that virus  nucleic acid is the cause of clinical symptoms. Negative  results should be treated as presumptive and, if inconsistent  with clinical signs and symptoms or necessary for patient  management, should be tested with an alternative molecular  assay. Negative results do not preclude SARS-CoV-2 infection  and should not be used as the sole basis for patient  management decisions. Invalid results may be due to inhibiting  substances in the specimen and recollection should occur.         Narrative:      Screening    CULTURE BLOOD AEROBIC AND ANAEROBIC [[id number]] Collected: 07/01/19 2340    Specimen: Blood, Venipuncture Updated: 07/07/19 0721    Narrative:      ORDER#: [id number]                                    ORDERED BY: Genia Harold, LUBNA  SOURCE: Blood, Venipuncture                          COLLECTED:  07/01/19 23:40  ANTIBIOTICS AT COLL.:                                RECEIVED :  07/02/19 04:54  Culture Blood Aerobic and Anaerobic        FINAL       07/07/19 07:21  07/07/19   No growth after 5 days of incubation.      CULTURE BLOOD AEROBIC AND ANAEROBIC [[id number]] Collected: 07/01/19 2340     Specimen: Blood, Venipuncture Updated: 07/07/19 0721    Narrative:      ORDER#: [id number]                                    ORDERED BY: Sheppard Coil  SOURCE:  Blood, Venipuncture                          COLLECTED:  07/01/19 23:40  ANTIBIOTICS AT COLL.:                                RECEIVED :  07/02/19 04:54  Culture Blood Aerobic and Anaerobic        FINAL       07/07/19 07:21  07/07/19   No growth after 5 days of incubation.      Culture + Gram Stain,Aerobic, Wound [[id number]] Collected: 07/06/19 1826    Specimen: Wound from Abscess Updated: 07/09/19 0850    Narrative:      ORDER#: [id number]                                    ORDERED BY: Rebecka Apley  SOURCE: Abscess groin                                COLLECTED:  07/06/19 18:26  ANTIBIOTICS AT COLL.:                                RECEIVED :  07/06/19 22:59  Stain, Gram                                FINAL       07/07/19 02:32  07/07/19   Few WBCs             No organisms seen  Culture and Gram Stain, Aerobic, Wound     PRELIM      07/09/19 08:50   +  07/09/19   Heavy growth of Staphylococcus aureus               Susceptibility to follow        Culture + Gram Stain,Aerobic, Wound [[id number]] Collected: 07/02/19 0009    Specimen: Wound from Discharge Updated: 07/04/19 0934    Narrative:      ORDER#: [id number]                                    ORDERED BY: Sheppard Coil  SOURCE: Discharge abdominal wound                    COLLECTED:  07/02/19 00:09  ANTIBIOTICS AT COLL.:                                RECEIVED :  07/02/19 04:57  Stain, Gram                                FINAL       07/02/19 06:02  07/02/19   Moderate WBCs             Moderate Gram positive cocci             Moderate Gram negative rods  No Squamous epithelial cells seen  Culture and Gram Stain, Aerobic, Wound     FINAL       07/04/19 09:34   +  07/04/19   Heavy growth of Pseudomonas aeruginosa      07/04/19   Heavy growth of Methicillin Resistant Staph  aureus      _____________________________________________________________________________                                  P.aeruginosa        MRSA        ANTIBIOTICS                     MIC  INTRP      MIC  INTRP      _____________________________________________________________________________  Amikacin                        <=8    S                        Aztreonam                        8     S                        Cefepime                         4     S                        Ceftazidime                      4     S                        Ciprofloxacin                 <=0.25   S                        Clindamycin                                    <=0.5   S        Daptomycin                                      <=1    S        Erythromycin                                    >4     R        Gentamicin                       4     S        <=2    S        Levofloxacin                   <=  0.5   S        >4     R        Linezolid                                       <=1    S        Meropenem                      <=0.5   S                        Oxacillin                                       >2     R  D1    Penicillin                                      >1     R        Piperacillin/Tazobactam         8/4    S                        Rifampin                                       <=0.5   S        Tetracycline                                   <=0.5   S        Tobramycin                      <=2    S                        Trimethoprim/Sulfamethoxazole                 <=0.5/9  S        Vancomycin                                       1     S          -----DRUG COMMENTS----------    D1:  Oxacillin sensitivity predicts sensitivity to cephalosporins.         [place] [employer] 2016  _____________________________________________________________________________            S=SUSCEPTIBLE     I=INTERMEDIATE     R=RESISTANT                             N/S=NON-SUSCEPTIBLE  _____________________________________________________________________________             No results found.      Echo Results     None  RADIOLOGY     Upon my review:    Signed,  Lucrecia Mcphearson Man Hatice Bubel       This chart was generated using hospital voice-recognition software which does not employ spell-checking or grammar-checking features. It was dictated, all or in part, in a busy and often noisy patient care environment. I have taken all usual measures to dictate carefully and to review all aspects this chart. Nonetheless, given the known and well-documented performance characteristics of VR software in such patient care environments, this dictation still may contain unrecognized and wholly unintended errors or omissions.

## 2019-07-13 NOTE — Progress Notes (Signed)
[facility] PROGRESS NOTE  Aaron Edelman, x [phone] [facility] Spectralink)      Date Time: 07/13/19 7:16 AM  Patient Name: [name]  Attending Physician: Earlie Server Man, MD    CC: follow-up CKD    Assessment:     1. ESRD- on HD at [facility]  2. Volume overload-much better  3. HTN: Controlled  4. Anemia- Hb stable at 7.4 (TSAT 22%)  5. Secondary HPT  6. Abdominal surgical wound- not healing, MRSA and Pseudomonas  7. Recurrent falls      Recommendations:     Next HD 6/22  Stable for discharge from renal standpoint      Case discussed with: pt, HD RN    Armando Reichert, MD  [facility]  [phone] (office)  X [phone] (FFX Spectra-Link)      Subjective: Dialyzed yesterday with 3400 cc removed    Review of Systems:   Cardio: no chest pain, no edema  Pulm: no SOB  GI: no nausea, vomiting or diarrhea    Physical Exam:     Vitals:    07/12/19 2015 07/12/19 2030 07/12/19 2325 07/13/19 0651   BP: 112/72 114/73 123/76 128/77   Pulse: 61 61 67 72   Resp: 18 17 16     Temp:  97.7 F (36.5 C) 98.6 F (37 C)    TempSrc:   Oral    SpO2: 99% 98% 95%    Weight:   111.1 kg (245 lb)    Height:           Intake and Output Summary (Last 24 hours) at Date Time    Intake/Output Summary (Last 24 hours) at 07/13/2019 0716  Last data filed at 07/13/2019 0600  Gross per 24 hour   Intake 820 ml   Output 3150 ml   Net -2330 ml     General: awake, alert, oriented x 3, no acute distress.  Cardiovascular: regular rate and rhythm  Lungs: clear to auscultation bilaterally, bilateral air entry, normal work of breathing  Abdomen: soft; LLQ wound bandaged  Extremities: trace edema  Other: RIJ Permcath    Meds:      Scheduled Meds: PRN Meds:    amLODIPine, 5 mg, Oral, Q12H  atorvastatin, 40 mg, Oral, Daily  carvedilol, 25 mg, Oral, Q12H  collagenase, , Topical, Q24H  darbepoetin alfa, 0.45 mcg/kg, Subcutaneous, Weekly  gabapentin, 300 mg, Oral, Daily  heparin (porcine), 5,000 Units, Subcutaneous, Q8H  SCH  insulin glargine, 30 Units, Subcutaneous, QAM  insulin lispro, 1-3 Units, Subcutaneous, QHS  insulin lispro, 1-5 Units, Subcutaneous, TID AC  miconazole 2 % with zinc oxide, , Topical, Q6H  nystatin, 1 application, Topical, BID  pantoprazole, 40 mg, Oral, BID  sevelamer, 800 mg, Oral, TID MEALS  sodium hypochlorite, , Irrigation, Daily  vancomycin, , Intravenous, See Admin Instructions  vitamin C, 500 mg, Oral, Daily  zinc sulfate, 220 mg, Oral, Daily          Continuous Infusions:   sodium chloride, , PRN  acetaminophen, 650 mg, Q6H PRN  dextrose, 15 g of glucose, PRN   And  dextrose, 12.5 g, PRN   And  glucagon (rDNA), 1 mg, PRN  melatonin, 3 mg, QHS PRN  naloxone, 0.2 mg, PRN  ondansetron, 4 mg, Q6H PRN   Or  ondansetron, 4 mg, Q6H PRN  oxyCODONE, 5 mg, Q6H PRN              Labs:     Recent Labs  Lab 07/13/19  0600 07/12/19  0319 07/11/19  0500   WBC 11.43* 9.21 9.41   Hgb 7.8* 7.4* 7.5*   Hematocrit 26.1* 24.7* 25.4*   Platelets 176 175 177     Recent Labs   Lab 07/13/19  0600 07/12/19  0319 07/11/19  0500   Sodium 137 138 139   Potassium 4.4 4.1 4.4   Chloride 101 99* 101   CO2 26 24 28    BUN 32.0* 45.0* 27.0*   Creatinine 4.4* 5.8* 4.4*   Calcium 8.8 8.6 8.7   Albumin 2.8* 2.6* 2.7*   Phosphorus 3.6 4.1 3.8   Magnesium 1.8 1.8 1.8   Glucose 232* 220* 166*   EGFR 12.2 8.9 12.2             Signed by: Armando Reichert, MD

## 2019-07-14 LAB — CBC
Absolute NRBC: 0.03 10*3/uL — ABNORMAL HIGH (ref 0.00–0.00)
Hematocrit: 25.8 % — ABNORMAL LOW (ref 34.7–43.7)
Hgb: 7.5 g/dL — ABNORMAL LOW (ref 11.4–14.8)
MCH: 27.1 pg (ref 25.1–33.5)
MCHC: 29.1 g/dL — ABNORMAL LOW (ref 31.5–35.8)
MCV: 93.1 fL (ref 78.0–96.0)
MPV: 10.4 fL (ref 8.9–12.5)
Nucleated RBC: 0.3 /100 WBC — ABNORMAL HIGH (ref 0.0–0.0)
Platelets: 178 10*3/uL (ref 142–346)
RBC: 2.77 10*6/uL — ABNORMAL LOW (ref 3.90–5.10)
RDW: 14 % (ref 11–15)
WBC: 11.63 10*3/uL — ABNORMAL HIGH (ref 3.10–9.50)

## 2019-07-14 LAB — MAGNESIUM: Magnesium: 1.9 mg/dL (ref 1.6–2.6)

## 2019-07-14 LAB — RENAL FUNCTION PANEL
Albumin: 2.7 g/dL — ABNORMAL LOW (ref 3.5–5.0)
Anion Gap: 13 (ref 5.0–15.0)
BUN: 53 mg/dL — ABNORMAL HIGH (ref 7.0–19.0)
CO2: 23 mEq/L (ref 22–29)
Calcium: 8.5 mg/dL (ref 8.5–10.5)
Chloride: 100 mEq/L (ref 100–111)
Creatinine: 5.9 mg/dL — ABNORMAL HIGH (ref 0.6–1.0)
Glucose: 299 mg/dL — ABNORMAL HIGH (ref 70–100)
Phosphorus: 3.6 mg/dL (ref 2.3–4.7)
Potassium: 4.4 mEq/L (ref 3.5–5.1)
Sodium: 136 mEq/L (ref 136–145)

## 2019-07-14 LAB — GLUCOSE WHOLE BLOOD - POCT
Whole Blood Glucose POCT: 294 mg/dL — ABNORMAL HIGH (ref 70–100)
Whole Blood Glucose POCT: 246 mg/dL — ABNORMAL HIGH (ref 70–100)
Whole Blood Glucose POCT: 249 mg/dL — ABNORMAL HIGH (ref 70–100)
Whole Blood Glucose POCT: 288 mg/dL — ABNORMAL HIGH (ref 70–100)

## 2019-07-14 LAB — GFR: EGFR: 8.7

## 2019-07-14 LAB — HEMOLYSIS INDEX: Hemolysis Index: 0 (ref 0–18)

## 2019-07-14 MED ORDER — CIPROFLOXACIN HCL 250 MG PO TABS
250.0000 mg | ORAL_TABLET | Freq: Every day | ORAL | 0 refills | Status: DC
Start: 2019-07-14 — End: 2019-07-28

## 2019-07-14 MED ORDER — RISAQUAD PO CAPS
1.00 | ORAL_CAPSULE | Freq: Every day | ORAL | 0 refills | Status: AC
Start: 2019-07-14 — End: 2019-08-13

## 2019-07-14 MED ORDER — DOXYCYCLINE MONOHYDRATE 100 MG PO CAPS
100.00 mg | ORAL_CAPSULE | Freq: Two times a day (BID) | ORAL | 0 refills | Status: DC
Start: 2019-07-14 — End: 2019-07-18

## 2019-07-14 MED ORDER — NYSTATIN 100000 UNIT/GM EX POWD
1.00 | Freq: Two times a day (BID) | CUTANEOUS | 0 refills | Status: DC
Start: 2019-07-14 — End: 2020-10-20

## 2019-07-14 MED ORDER — FLUCONAZOLE 50 MG PO TABS
50.0000 mg | ORAL_TABLET | Freq: Every day | ORAL | 0 refills | Status: DC
Start: 2019-07-14 — End: 2019-07-18

## 2019-07-14 MED ORDER — FLUCONAZOLE 100 MG PO TABS
100.0000 mg | ORAL_TABLET | Freq: Every day | ORAL | 0 refills | Status: DC
Start: 2019-07-14 — End: 2019-07-14

## 2019-07-14 MED ORDER — AMLODIPINE BESYLATE 5 MG PO TABS
5.0000 mg | ORAL_TABLET | Freq: Every day | ORAL | Status: DC
Start: 2019-07-15 — End: 2019-07-18
  Administered 2019-07-15 – 2019-07-17 (×3): 5 mg via ORAL
  Filled 2019-07-14 (×3): qty 1

## 2019-07-14 MED ORDER — ZINC OXIDE 40 % EX PSTE
PASTE | Freq: Four times a day (QID) | CUTANEOUS | Status: AC
Start: 2019-07-14 — End: 2019-08-13

## 2019-07-14 NOTE — Plan of Care (Signed)
Problem: Pain  Goal: Pain at adequate level as identified by patient  Outcome: Progressing  Flowsheets (Taken 07/10/2019 0820 by Ezequiel Essex, RN)  Pain at adequate level as identified by patient:   Identify patient comfort function goal   Assess for risk of opioid induced respiratory depression, including snoring/sleep apnea. Alert healthcare team of risk factors identified.   Assess pain on admission, during daily assessment and/or before any "as needed" intervention(s)   Reassess pain within 30-60 minutes of any procedure/intervention, per Pain Assessment, Intervention, Reassessment (AIR) Cycle   Evaluate if patient comfort function goal is met   Evaluate patient's satisfaction with pain management progress     Problem: Safety  Goal: Patient will be free from injury during hospitalization  Outcome: Progressing  Flowsheets (Taken 07/09/2019 0704 by Ezequiel Essex, RN)  Patient will be free from injury during hospitalization:   Assess patient's risk for falls and implement fall prevention plan of care per policy   Use appropriate transfer methods   Ensure appropriate safety devices are available at the bedside   Include patient/ family/ care giver in decisions related to safety   Provide and maintain safe environment   Hourly rounding  Goal: Patient will be free from infection during hospitalization  Outcome: Progressing  Flowsheets (Taken 07/08/2019 2041 by Maximino Sarin, RN)  Free from Infection during hospitalization:   Assess and monitor for signs and symptoms of infection   Monitor lab/diagnostic results   Monitor all insertion sites (i.e. indwelling lines, tubes, urinary catheters, and drains)     Problem: Pain  Goal: Pain at adequate level as identified by patient  Outcome: Progressing  Flowsheets (Taken 07/10/2019 0820 by Ezequiel Essex, RN)  Pain at adequate level as identified by patient:   Identify patient comfort function goal   Assess for risk of opioid induced respiratory depression,  including snoring/sleep apnea. Alert healthcare team of risk factors identified.   Assess pain on admission, during daily assessment and/or before any "as needed" intervention(s)   Reassess pain within 30-60 minutes of any procedure/intervention, per Pain Assessment, Intervention, Reassessment (AIR) Cycle   Evaluate if patient comfort function goal is met   Evaluate patient's satisfaction with pain management progress     Problem: Compromised Tissue integrity  Goal: Damaged tissue is healing and protected  Outcome: Progressing  Flowsheets (Taken 07/09/2019 0704 by Ezequiel Essex, RN)  Damaged tissue is healing and protected:   Monitor/assess Braden scale every shift   Reposition patient every 2 hours and as needed unless able to reposition self   Provide wound care per wound care algorithm   Increase activity as tolerated/progressive mobility   Avoid shearing injuries   Keep intact skin clean and dry   Use bath wipes, not soap and water, for daily bathing   Relieve pressure to bony prominences for patients at moderate and high risk  Goal: Nutritional status is improving  Outcome: Progressing  Flowsheets (Taken 07/08/2019 2041 by Maximino Sarin, RN)  Nutritional status is improving:   Allow adequate time for meals   Assist patient with eating   Encourage patient to take dietary supplement(s) as ordered     Problem: Diabetes: Glucose Imbalance  Goal: Blood glucose stable at established goal  Outcome: Progressing  Flowsheets (Taken 07/06/2019 1239 by Venia Minks, RN)  Blood glucose stable at established goal:   Monitor lab values   Monitor intake and output.  Notify LIP if urine output is < 30 mL/hour.   Include patient/family in decisions  related to nutrition/dietary selections   Follow fluid restrictions/IV/PO parameters   Assess for hypoglycemia /hyperglycemia   Monitor/assess vital signs   Coordinate medication administration with meals, as indicated   Ensure appropriate diet and assess tolerance   Ensure  adequate hydration   Ensure appropriate consults are obtained (Nutrition, Diabetes Education, and Case Management)   Ensure patient/family has adequate teaching materials     Problem: Infection  Goal: Free from infection  Outcome: Progressing  Flowsheets (Taken 07/06/2019 1239 by Venia Minks, RN)  Free from infection:   Assess for signs/symptoms of infection   Utilize isolation precautions per protocol/policy   Consult/collaborate with Infection Preventionist   Assess immunization status   Utilize sepsis protocol     Problem: Renal Instability  Goal: Fluid and electrolyte balance are achieved/maintained  Outcome: Progressing  Flowsheets (Taken 07/12/2019 2123 by Mickel Baas, RN)  Fluid and electrolyte balance are achieved/maintained:   Monitor intake and output every shift   Monitor/assess lab values and report abnormal values   Provide adequate hydration   Assess and reassess fluid and electrolyte status   Observe for cardiac arrhythmias   Assess for confusion/personality changes

## 2019-07-14 NOTE — Progress Notes (Signed)
Medicine Nursing Note    Date Time: 07/14/19 5:34 AM  Patient Name: [name]  Attending Physician: Earlie Server Man, MD    Assessment     Shift event: Pain management, Wound Care, Medication administration   Neuro: A&O x 4, Calm and Cooperative    Tele/CV/CP No Tele, VSS, RA, Afebrile   Foley/GU N/A Pt voiding    BMs/GI Last BM 06/19, on Renal Diet 80 Gm Protein Restriction   Skin: Sacral wound; Abdominal Surgical Incision wound   Isolation: Contact - MRSA   Pain: Pt c/o pain 8/10 - Right wrist   MEWS: 1   Central Line: Right Hemodialysis Permacath   Safety:  Placed High fall risk. Safety maintained, purposeful rounding performed, Bed alarm activated, Call bell within reach.    Interpreter: Pt speaks English and denies interpreter needs.   Comments:  Pt c/o pain and PRN medication given.   Medications given per order.   IV site assessed, flushed, and saline-locked    Provided comfort including offers of repositioning, pain medication and heating pad.   Oral fluids offered. Hygiene supply provided.     No c/o Chest Pain  No c/o Cough and SOB.  No acute distress noted with breathing.  Denied Nausea and Vomiting.       The whiteboard was updated with current team information.   The belongings are at bedside.   Discussed and explained the care plan for the night.   Explained and educated Pt on safety precaution.

## 2019-07-14 NOTE — Progress Notes (Signed)
Call from [facility] asking for update on pt's d/c status. Most recent MD note indicates d/c dispo SNF v home.  Writer provided copy of updated progress note to [facility].     Freida Busman, HD Care Coordinator  [phone]

## 2019-07-14 NOTE — Progress Notes (Signed)
[facility] PROGRESS NOTE  [place], x [phone] [facility] Spectralink)      Date Time: 07/14/19 12:26 PM  Patient Name: [name]  Attending Physician: Earlie Server Man, MD    CC: follow-up CKD    Assessment:     1. ESRD- on HD at [facility]  2. Volume overload-much better  3. HTN: Controlled  4. Anemia- Hb stable   5. Secondary HPT  6. Abdominal surgical wound- not healing, MRSA and Pseudomonas  7. Recurrent falls      Recommendations:   1. HD tomorrow  2. Decrease norvasc to 5 mg qd    Stable for discharge from renal standpoint      Case discussed with: pt, HD RN    Corky Mull, MD  [facility]  [phone] (office)  X [phone] (FFX Spectra-Link)      Subjective:  No c/o    Review of Systems:   Cardio: no chest pain, no edema  Pulm: no SOB  GI: no nausea, vomiting or diarrhea    Physical Exam:     Vitals:    07/14/19 0533 07/14/19 0702 07/14/19 0729 07/14/19 1222   BP: 135/83 127/74 124/79 102/62   Pulse: 69 67 67 68   Resp: 15  17 17    Temp: 98.4 F (36.9 C)  97.3 F (36.3 C) 98.2 F (36.8 C)   TempSrc: Oral  Oral Oral   SpO2: 97%  97% 97%   Weight: 111.1 kg (245 lb)      Height:           Intake and Output Summary (Last 24 hours) at Date Time    Intake/Output Summary (Last 24 hours) at 07/14/2019 1226  Last data filed at 07/14/2019 1200  Gross per 24 hour   Intake 1460 ml   Output 200 ml   Net 1260 ml     General: awake, alert, oriented x 3, no acute distress.  Cardiovascular: regular rate and rhythm  Lungs: clear to auscultation bilaterally, bilateral air entry, normal work of breathing  Abdomen: soft; LLQ wound bandaged  Extremities: trace edema  Other: RIJ Permcath    Meds:      Scheduled Meds: PRN Meds:    amLODIPine, 5 mg, Oral, Q12H  atorvastatin, 40 mg, Oral, Daily  carvedilol, 25 mg, Oral, Q12H  ciprofloxacin, 250 mg, Oral, Daily at 1800  collagenase, , Topical, Q24H  darbepoetin alfa, 0.45 mcg/kg, Subcutaneous, Weekly  gabapentin, 300 mg, Oral, Daily  heparin  (porcine), 5,000 Units, Subcutaneous, Q8H SCH  insulin glargine, 30 Units, Subcutaneous, QAM  insulin lispro, 1-3 Units, Subcutaneous, QHS  insulin lispro, 1-5 Units, Subcutaneous, TID AC  miconazole 2 % with zinc oxide, , Topical, Q6H  nystatin, 1 application, Topical, BID  pantoprazole, 40 mg, Oral, BID  sevelamer, 800 mg, Oral, TID MEALS  sodium hypochlorite, , Irrigation, Daily  vancomycin, , Intravenous, See Admin Instructions  vitamin C, 500 mg, Oral, Daily  zinc sulfate, 220 mg, Oral, Daily          Continuous Infusions:   sodium chloride, , PRN  acetaminophen, 650 mg, Q6H PRN  dextrose, 15 g of glucose, PRN   And  dextrose, 12.5 g, PRN   And  glucagon (rDNA), 1 mg, PRN  melatonin, 3 mg, QHS PRN  naloxone, 0.2 mg, PRN  ondansetron, 4 mg, Q6H PRN   Or  ondansetron, 4 mg, Q6H PRN  oxyCODONE, 5 mg, Q6H PRN  Labs:     Recent Labs   Lab 07/14/19  0535 07/13/19  0600 07/12/19  0319   WBC 11.63* 11.43* 9.21   Hgb 7.5* 7.8* 7.4*   Hematocrit 25.8* 26.1* 24.7*   Platelets 178 176 175     Recent Labs   Lab 07/14/19  0535 07/13/19  0600 07/12/19  0319   Sodium 136 137 138   Potassium 4.4 4.4 4.1   Chloride 100 101 99*   CO2 23 26 24    BUN 53.0* 32.0* 45.0*   Creatinine 5.9* 4.4* 5.8*   Calcium 8.5 8.8 8.6   Albumin 2.7* 2.8* 2.6*   Phosphorus 3.6 3.6 4.1   Magnesium 1.9 1.8 1.8   Glucose 299* 232* 220*   EGFR 8.7 12.2 8.9             Signed by: Corky Mull, MD

## 2019-07-14 NOTE — Plan of Care (Signed)
Problem: Pain  Goal: Pain at adequate level as identified by patient  Outcome: Adequate for Discharge  Flowsheets (Taken 07/10/2019 0820 by Ezequiel Essex, RN)  Pain at adequate level as identified by patient:   Identify patient comfort function goal   Assess for risk of opioid induced respiratory depression, including snoring/sleep apnea. Alert healthcare team of risk factors identified.   Assess pain on admission, during daily assessment and/or before any "as needed" intervention(s)   Reassess pain within 30-60 minutes of any procedure/intervention, per Pain Assessment, Intervention, Reassessment (AIR) Cycle   Evaluate if patient comfort function goal is met   Evaluate patient's satisfaction with pain management progress     Problem: Safety  Goal: Patient will be free from injury during hospitalization  Outcome: Adequate for Discharge  Flowsheets (Taken 07/09/2019 0704 by Ezequiel Essex, RN)  Patient will be free from injury during hospitalization:   Assess patient's risk for falls and implement fall prevention plan of care per policy   Use appropriate transfer methods   Ensure appropriate safety devices are available at the bedside   Include patient/ family/ care giver in decisions related to safety   Provide and maintain safe environment   Hourly rounding  Goal: Patient will be free from infection during hospitalization  Outcome: Adequate for Discharge  Flowsheets (Taken 07/08/2019 2041 by Maximino Sarin, RN)  Free from Infection during hospitalization:   Assess and monitor for signs and symptoms of infection   Monitor lab/diagnostic results   Monitor all insertion sites (i.e. indwelling lines, tubes, urinary catheters, and drains)     Problem: Pain  Goal: Pain at adequate level as identified by patient  Outcome: Adequate for Discharge  Flowsheets (Taken 07/10/2019 0820 by Ezequiel Essex, RN)  Pain at adequate level as identified by patient:   Identify patient comfort function goal   Assess for risk  of opioid induced respiratory depression, including snoring/sleep apnea. Alert healthcare team of risk factors identified.   Assess pain on admission, during daily assessment and/or before any "as needed" intervention(s)   Reassess pain within 30-60 minutes of any procedure/intervention, per Pain Assessment, Intervention, Reassessment (AIR) Cycle   Evaluate if patient comfort function goal is met   Evaluate patient's satisfaction with pain management progress     Problem: Side Effects from Pain Analgesia  Goal: Patient will experience minimal side effects of analgesic therapy  Outcome: Adequate for Discharge  Flowsheets (Taken 07/07/2019 1955 by Maximino Sarin, RN)  Patient will experience minimal side effects of analgesic therapy:   Monitor/assess patient's respiratory status (RR depth, effort, breath sounds)   Prevent/manage side effects per LIP orders (i.e. nausea, vomiting, pruritus, constipation, urinary retention, etc.)   Evaluate for opioid-induced sedation with appropriate assessment tool (i.e. POSS)     Problem: Discharge Barriers  Goal: Patient will be discharged home or other facility with appropriate resources  Outcome: Adequate for Discharge     Problem: Compromised Tissue integrity  Goal: Damaged tissue is healing and protected  Outcome: Adequate for Discharge  Flowsheets (Taken 07/09/2019 0704 by Ezequiel Essex, RN)  Damaged tissue is healing and protected:   Monitor/assess Braden scale every shift   Reposition patient every 2 hours and as needed unless able to reposition self   Provide wound care per wound care algorithm   Increase activity as tolerated/progressive mobility   Avoid shearing injuries   Keep intact skin clean and dry   Use bath wipes, not soap and water, for daily bathing   Relieve  pressure to bony prominences for patients at moderate and high risk  Goal: Nutritional status is improving  Outcome: Adequate for Discharge  Flowsheets (Taken 07/08/2019 2041 by Maximino Sarin,  RN)  Nutritional status is improving:   Allow adequate time for meals   Assist patient with eating   Encourage patient to take dietary supplement(s) as ordered     Problem: Renal Instability  Goal: Fluid and electrolyte balance are achieved/maintained  Outcome: Adequate for Discharge  Flowsheets (Taken 07/12/2019 2123 by Mickel Baas, RN)  Fluid and electrolyte balance are achieved/maintained:   Monitor intake and output every shift   Monitor/assess lab values and report abnormal values   Provide adequate hydration   Assess and reassess fluid and electrolyte status   Observe for cardiac arrhythmias   Assess for confusion/personality changes  Goal: Free from infection  Outcome: Adequate for Discharge  Flowsheets (Taken 07/12/2019 2123 by Mickel Baas, RN)  Free from infection: Monitor/assess for signs and symptoms of infection     Problem: Patient Receiving Advanced Renal Therapies  Goal: Therapy access site remains intact  Outcome: Adequate for Discharge  Flowsheets (Taken 07/12/2019 2123 by Mickel Baas, RN)  Therapy access site remains intact:   Assess therapy access site   Change therapy access site dressing as needed     Problem: Diabetes: Glucose Imbalance  Goal: Blood glucose stable at established goal  Outcome: Adequate for Discharge  Flowsheets (Taken 07/06/2019 1239 by Venia Minks, RN)  Blood glucose stable at established goal:   Monitor lab values   Monitor intake and output.  Notify LIP if urine output is < 30 mL/hour.   Include patient/family in decisions related to nutrition/dietary selections   Follow fluid restrictions/IV/PO parameters   Assess for hypoglycemia /hyperglycemia   Monitor/assess vital signs   Coordinate medication administration with meals, as indicated   Ensure appropriate diet and assess tolerance   Ensure adequate hydration   Ensure appropriate consults are obtained (Nutrition, Diabetes Education, and Case Management)   Ensure patient/family has adequate teaching materials      Problem: Inadequate Cardiac Output  Goal: Adequate tissue perfusion will be maintained  Outcome: Adequate for Discharge  Flowsheets (Taken 07/06/2019 0437 by Candice Camp, RN)  Adequate tissue perfusion will be maintained:   Monitor/assess vital signs   Monitor/assess neurovascular status (pulses, capillary refill, pain, paresthesia, paralysis, presence of edema)   Monitor intake and output   Monitor/assess for signs of VTE (edema of calf/thigh redness, pain)   Monitor/assess lab values and report abnormal values     Problem: Infection  Goal: Free from infection  Outcome: Adequate for Discharge  Flowsheets (Taken 07/06/2019 1239 by Venia Minks, RN)  Free from infection:   Assess for signs/symptoms of infection   Utilize isolation precautions per protocol/policy   Consult/collaborate with Infection Preventionist   Assess immunization status   Utilize sepsis protocol     Problem: Nutrition  Goal: Nutritional intake is adequate  Outcome: Adequate for Discharge  Flowsheets (Taken 07/01/2019 1336 by Mickel Baas, RN)  Nutritional intake is adequate:   Monitor daily weights   Consult/collaborate with Clinical Nutritionist   Assess anorexia, appetite, and amount of meal/food tolerated   Include patient/patient care companion in decisions related to nutrition     Pt is A&Ox4. Wound care preformed and the abdominal wound is some what malodorous. BS managed. Pain on the R wrist managed with Oxy, Tylenol, heat and cold packs. She is wearing her Wrist brace. In  the morning her stomach  very upset and she didn't eat breakfast until later. Zofran given, she got some rest and the pain subsided. The Doctor said she is medically cleared for discharge. We have been back and forth with case management who is working on a placement for her. Pt said she can not and will not pay for a portion of Rehab. Pt is still concerned that she is not walking that well on her own. The CM is seeing what can be done. Pt is frustrated not knowing  if she is leaving today or not, because it effects her ability to scheduled outpatient dialysis and other doctors appointments.

## 2019-07-14 NOTE — Discharge Summary (Signed)
MEDICINE DISCHARGE SUMMARY    Date Time: 07/14/19 2:52 PM  Patient Name: [name]  Attending Physician: Earlie Server Man, MD  Primary Care Physician: Nathanial Rancher, MD    Date of Admission: 06/30/2019  Date of Discharge: 07/14/2019    Discharge Diagnoses:     Principal Problem:    Dizziness  Active Problems:    ESRD (end stage renal disease) on dialysis    Uncontrolled type 2 diabetes mellitus with hyperglycemia    Hypertension    Hyperlipidemia    History of COPD    History of anemia due to chronic kidney disease    Open wound, abdominal wall, lateral, subsequent encounter    Fall  Resolved Problems:    * No resolved hospital problems. *      Disposition:     SNF    Pending Results, Recommendations & Instructions to providers after discharge:     1. Micro / Labs / Path pending: none    Recent Labs - Last 2:       Recent Labs   Lab 07/14/19  0535 07/13/19  0600   WBC 11.63* 11.43*   Hgb 7.5* 7.8*   Hematocrit 25.8* 26.1*   Platelets 178 176           Recent Labs   Lab 07/14/19  0535 07/13/19  0600   Albumin 2.7* 2.8*      Recent Labs   Lab 07/14/19  0535 07/13/19  0600   Sodium 136 137   Potassium 4.4 4.4   Chloride 100 101   CO2 23 26   BUN 53.0* 32.0*   Glucose 299* 232*   Calcium 8.5 8.8   Magnesium 1.9 1.8   Phosphorus 3.6 3.6                 Invalid input(s): FREET4         Procedures/Radiology performed:     CT HEAD WO CONTRAST  CT CERVICAL SPINE WO CONTRAST  XR WRIST RIGHT 3+ VIEWS  CT HEAD WO CONTRAST  XR CHEST AP PORTABLE  TUNNELED CATH CHECK/CHANGE [facility])       Hospital Course:     Reason for admission/ HPI/Hospital Course: Patient has been admitted with febrile illness, confusion and proneness to falls.  Patient was found to have abdominal wall cellulitis/wound.  Cultures turned positive methicillin resistant Staphylococcus aureus and Pseudomonas. aeruginosa.  Patient has been treated with combination of IV vancomycin and levofloxacin.  ID note and recommendations appreciated.  Patient also had  been evaluated by plastic surgery.   Patient's lab shows anemia, hemoglobin low at 6.4 repeated hemoglobin 6.8.  Patient is on dialysis.  Considering patient's progress to falls and complaint of dizziness.  s/p 1 unit packed RBCs transfusion.    6/20.  She is waiting for transportation to go home in a.m.  She did not have any new acute events.  She has been on wound care for abdominal wall cellulitis.  Denies fever, chills, nausea, vomiting, chest pain or short of breath.  She is comfortably is laying in bed.   6/21.  She has moderate pain in and around the wound area without fever, chills, nausea, vomiting, diarrhea and urinary complaints.  She is comfortably laying in the bed and she is willing to be discharged today.  I reviewed all her vitals, labs, medication and cultures.     plan    Left upper quadrant abdominal wall cellulitis   - due to MRSA  -  Continue IV vancomycin and oral Levaquin  - follow ID and plastic surgery team  - d/w rimary plastic surgery: Dr. Joan Flores [phone] continue local wound care and follow up in a week at his office  - on Pain management: Acetaminophen and Oxycodone  - no fever, has mild leukocytosis  - wound culture on 6/9--> MRSA and pseudomonas  - wound culture on 6/13--> MRSA and Enterococcal fecalis  - Blood culture on 6/8--> NGTD  - as per ID attending: discharge with 5 days of p.o. Cipro 250 mg daily, doxycycline 100 mg p.o. twice daily and 50 of fluconazole for 5 days  - stable to be discharge today: home Vs SNF today  - follow CM for discharge plan    Mechanical fall  - reviewed X ray of Rt fore arm done on 6/7--> no new fractures, No malalignment   - X ray Rt arm on 6/2--> Unremarkable right humerus  - on fall precaution  - PT/OT eval- --> home Vs SNF placement  - stable to be discharge today       T2DM  - continue basal insulin glargine 30units daily and SSI with lispro  - follow up with PCP      ESRD on HD with malfunctiond tunneled dialysis catheter  -  s/p  exchange catheter on 6/10  - follow up with primary Nephrology team    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 Iron profile suggestive of AOCD    H/o HTN, HLP  - on continue Amlodipine, Coreg, Atorvastatin      Diet: Renal  DVT Prophylaxis: Heparin  Code Status: DNR      Discharge Day Exam:  Temp:  [97.3 F (36.3 C)-99.1 F (37.3 C)] 98.2 F (36.8 C)  Heart Rate:  [67-72] 68  Resp Rate:  [15-17] 17  BP: (98-135)/(61-83) 102/62    GEN APPEARANCE: NAD.  Pleasant, alert and cooperative.    HEENT: NCAT, EOMI, PERRL, no nasal discharge.   conjunctivae/corneas clear. Oral mucosa moist. Pallor+, no icterus  Neck: Supple without meningismus, no mass/JVD  CVS: RRR, S1, S2. No murmur   LUNGS: CTAB; No wheezes, crackles, rhonchi or rales. HD catheter in anterior chest wall- site- clean and dry.  ABD: BS+, soft, NT, ND, no guarding or rigidity  LUQ wound, small amount of drainage noted   EXT: No edema; Pulses 2+ and intact  SKIN: skin is warm and dry. No rash noted.  MSK: full ROM intact. No joint tenderness or swelling.   NEURO: AAOx3, CN 2-12 intact.  No focal neurological deficits  MENTAL STATUS: appropriate affect and mood        Consultations:     Treatment Team: Attending Provider: Earlie Server Man, MD; Consulting Physician: Renard Hamper, MD; Consulting Physician: Armando Reichert, MD; Case Manager: Freida Busman; Wound Ostomy Continence Nurse: Hanley Hays, RN; Consulting Physician: Ernst Breach, MD; Consulting Physician: Renelda Loma, MD; Technician: Providence Crosby; Registered Nurse: Jory Sims, RN; Social Worker: Marrion Coy.; Case Manager: Marciano Sequin, RN    Discharge Condition:     Improved      Discharge Instructions & Follow Up Plan for Patient:     Diet: diabetic diet- renal diet    Activity/Weight Bearing Status: as tolerated with fall and aspiration precautions    Patient was instructed to follow up with:   Primary Care Doctor Pcp, None, MD in a month &  the following Consultants:  1. Dr. Joan Flores- plastic surgery in  a week  2. Dr. Lahoma Crocker- ID attending in a week  3. Primary Nephrologist in a week    Minutes spent coordinating discharge and reviewing discharge plan: 33 minutes    Discharge Medications:        Medication List      START taking these medications    ciprofloxacin 250 MG tablet  Commonly known as: CIPRO  Take 1 tablet (250 mg total) by mouth daily for 5 days     doxycycline 100 MG capsule  Commonly known as: MONODOX  Take 1 capsule (100 mg total) by mouth every 12 (twelve) hours for 5 days     fluconazole 50 MG tablet  Commonly known as: Diflucan  Take 1 tablet (50 mg total) by mouth daily for 5 days     nystatin powder  Commonly known as: NYSTOP  Apply 1 application topically 2 (two) times daily        CHANGE how you take these medications    insulin aspart 100 UNIT/ML injection  Commonly known as: NovoLOG  Inject 5 Units into the skin 3 (three) times daily before meals  What changed: how much to take     insulin glargine 100 UNIT/ML injection  Commonly known as: LANTUS  Inject 30 Units into the skin every 12 (twelve) hours  What changed:    how much to take   when to take this        CONTINUE taking these medications    amLODIPine 5 MG tablet  Commonly known as: NORVASC  Take 1 tablet (5 mg total) by mouth every 12 (twelve) hours     atorvastatin 40 MG tablet  Commonly known as: LIPITOR     carvedilol 25 MG tablet  Commonly known as: COREG  Take 1 tablet (25 mg total) by mouth every 12 (twelve) hours     collagenase ointment  Commonly known as: SANTYL  Apply topically every 24 hours     famotidine 40 MG tablet  Commonly known as: PEPCID     gabapentin 600 MG tablet  Commonly known as: NEURONTIN     lactobacillus/streptococcus Caps  Take 1 capsule by mouth daily     lidocaine 5 %  Commonly known as: LIDODERM  Place 1 patch onto the skin every 24 hours Remove & Discard patch within 12 hours or as directed by MD     oxyCODONE 5 MG immediate release  tablet  Commonly known as: ROXICODONE  Take 1 tablet (5 mg total) by mouth every 6 (six) hours as needed for Pain     pantoprazole 40 MG tablet  Commonly known as: PROTONIX  Take 1 tablet (40 mg total) by mouth 2 (two) times daily     sevelamer 800 MG tablet  Commonly known as: RENVELA     sodium hypochlorite 0.125 % Soln  Commonly known as: DAKIN'S QUARTER STRENGTH  Irrigate with as directed daily     Trulicity 1.88 CZ/6.6AY Sopn  Generic drug: Dulaglutide     vitamin C 500 MG tablet  Commonly known as: ASCORBIC ACID  Take 1 tablet (500 mg total) by mouth daily     zinc Oxide 40 % Pste paste  Commonly known as: DESITIN  Apply topically every 6 (six) hours     zinc sulfate 220 (50 Zn) MG capsule  Commonly known as: ZINCATE  Take 1 capsule (220 mg total) by mouth daily           Where to Get Your Medications  These medications were sent to [facility], [address], [place] [address]    Phone: [phone]    ciprofloxacin 250 MG tablet   doxycycline 100 MG capsule   fluconazole 50 MG tablet   nystatin powder     Information about where to get these medications is not yet available    Ask your nurse or doctor about these medications   lactobacillus/streptococcus Caps   zinc Oxide 40 % Pste paste                 Signed by: Elodia Florence Man Jason Nest, MD    CC: Pcp, None, MD

## 2019-07-14 NOTE — Progress Notes (Signed)
[facility]  PROGRESS NOTE      Patient: [name]  Date: 07/14/2019   LOS: 12 Days  Admission Date: 06/30/2019   MRN: [id number]  Attending: Elodia Florence Man Makilah Dowda  Please contact me via Secure Chat       ASSESSMENT/PLAN     [name] is a 64 y.o. female admitted with Dizziness    Interval Summary:     Active Hospital Problems    Diagnosis    Dizziness    Fall    Open wound, abdominal wall, lateral, subsequent encounter    Uncontrolled type 2 diabetes mellitus with hyperglycemia    Hypertension    Hyperlipidemia    History of COPD    History of anemia due to chronic kidney disease    ESRD (end stage renal disease) on dialysis      Patient has been admitted with febrile illness, confusion and proneness to falls.  Patient was found to have abdominal wall cellulitis/wound.  Cultures turned positive methicillin resistant Staphylococcus aureus and Pseudomonas. aeruginosa.  Patient has been treated with combination of IV vancomycin and levofloxacin.  ID note and recommendations appreciated.  Patient also had been evaluated by plastic surgery.   Patient's lab shows anemia, hemoglobin low at 6.4 repeated hemoglobin 6.8.  Patient is on dialysis.  Considering patient's progress to falls and complaint of dizziness.  s/p 1 unit packed RBCs transfusion.         Left upper quadrant abdominal wall cellulitis   - due to MRSA  - Continue  IV vancomycin and oral Levaquin  - follow ID and plastic surgery team  - d/w rimary plastic surgery: Dr. Joan Flores [phone] continue local wound care and follow up in a week at his office  - on Pain management: Acetaminophen and Oxycodone  - no fever, has mild leukocytosis  - wound culture on 6/9--> MRSA and pseudomonas  - wound culture on 6/13--> MRSA and Enterococcal fecalis  - Blood culture on 6/8--> NGTD  - as per ID attending: discharge on 5 days of p.o. Cipro 250 mg daily, doxycycline 100 mg p.o. 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 Iron profile suggestive of AOCD    H/o HTN, HLP  - on continue Amlodipine, Coreg, Atorvastatin      Diet: Renal  DVT Prophylaxis: Heparin  Code Status: DNR          DISPO: home vs SNF likely in the next 1-2 days     Family Contact: Daughter                SUBJECTIVE   Patient seen and examined.  No new complaints. Pain controlled. Updated on plan of care.  Denies chest pain shortness breath abdominal pain nausea vomiting diarrhea     6/20.  She is waiting for transportation to go home in a.m.  She did not have any new acute events.  She has been on wound care for abdominal wall cellulitis.  Denies fever, chills, nausea, vomiting, chest pain or short of breath.  She is comfortably is laying in bed.   6/21.  She has moderate pain in and around the wound area without fever, chills, nausea, vomiting, diarrhea and urinary complaints.  She is comfortably laying in the bed and she is willing to be discharged today.  I reviewed all her vitals, labs, medication and cultures.     MEDICATIONS     Current Facility-Administered Medications   Medication Dose Route Frequency    amLODIPine  5 mg Oral Q12H    atorvastatin  40 mg Oral Daily    carvedilol  25 mg Oral Q12H    ciprofloxacin  250 mg Oral Daily at 1800    collagenase   Topical Q24H    darbepoetin alfa  0.45 mcg/kg Subcutaneous Weekly    gabapentin  300 mg Oral Daily    heparin (porcine)  5,000 Units Subcutaneous Q8H [place]    insulin glargine  30 Units  Subcutaneous QAM    insulin lispro  1-3 Units Subcutaneous QHS    insulin lispro  1-5 Units Subcutaneous TID AC    miconazole 2 % with zinc oxide   Topical Q6H    nystatin  1 application Topical BID    pantoprazole  40 mg Oral BID    sevelamer  800 mg Oral TID MEALS    sodium hypochlorite   Irrigation Daily    vancomycin   Intravenous See Admin Instructions    vitamin C  500 mg Oral Daily    zinc sulfate  220 mg Oral Daily       PHYSICAL EXAM     Vitals:    07/14/19 0729   BP: 124/79   Pulse: 67   Resp: 17   Temp: 97.3 F (36.3 C)   SpO2: 97%       Temperature: Temp  Min: 97.3 F (36.3 C)  Max: 99.1 F (37.3 C)  Pulse: Pulse  Min: 67  Max: 72  Respiratory: Resp  Min: 15  Max: 17  Non-Invasive BP: BP  Min: 98/61  Max: 135/83  Pulse Oximetry SpO2  Min: 95 %  Max: 99 %    Intake and Output Summary (Last 24 hours) at Date Time    Intake/Output Summary (Last 24 hours) at 07/14/2019 1051  Last data filed at 07/14/2019 0655  Gross per 24 hour   Intake 1050 ml   Output 200 ml   Net 850 ml               PHYSICAL EXAMINATION  GEN APPEARANCE: NAD.  Pleasant, alert and cooperative.    HEENT: NCAT, EOMI, PERRL, no nasal discharge.   conjunctivae/corneas clear. Oral mucosa moist. Pallor+, no icterus  Neck: Supple without meningismus, no mass/JVD  CVS: RRR, S1, S2. No murmur   LUNGS: CTAB; No wheezes, crackles, rhonchi or rales. HD catheter in anterior chest wall- site- clean and dry.  ABD: BS+, soft, NT, ND, no guarding or rigidity  LUQ wound, small amount of drainage noted   EXT: No edema; Pulses 2+ and intact  SKIN: skin is warm and dry. No rash noted.  MSK: full ROM intact. No joint tenderness or swelling.   NEURO: AAOx3, CN 2-12 intact.  No focal neurological deficits  MENTAL STATUS: appropriate affect  and mood        LABS     Recent Labs   Lab 07/14/19  0535 07/13/19  0600 07/12/19  0319   WBC 11.63* 11.43* 9.21   RBC 2.77* 2.84* 2.66*   Hgb 7.5* 7.8* 7.4*   Hematocrit 25.8* 26.1* 24.7*   MCV 93.1 91.9 92.9    Platelets 178 176 175       Recent Labs   Lab 07/14/19  0535 07/13/19  0600 07/12/19  0319 07/11/19  0500 07/10/19  0624   Sodium 136 137 138 139 136   Potassium 4.4 4.4 4.1 4.4 4.5   Chloride 100 101 99* 101 100   CO2 23 26 24 28 24    BUN 53.0* 32.0* 45.0* 27.0* 45.0*   Creatinine 5.9* 4.4* 5.8* 4.4* 5.8*   Glucose 299* 232* 220* 166* 256*   Calcium 8.5 8.8 8.6 8.7 8.4*   Magnesium 1.9 1.8 1.8 1.8 1.8       Recent Labs   Lab 07/14/19  0535 07/13/19  0600 07/12/19  0319   Albumin 2.7* 2.8* 2.6*                   Microbiology Results     Procedure Component Value Units Date/Time    COVID-19 (SARS-COV-2) [facility]) [[id number]] Collected: 06/30/19 2256    Specimen: Nasopharyngeal Swab from Nasopharynx Updated: 06/30/19 2341     Purpose of COVID testing Screening     SARS-CoV-2 Specimen Source Nasopharyngeal     SARS CoV 2 Overall Result Negative     Comment: Test performed using the Abbott ID NOW EUA assay.  Please see Fact Sheets for patients and providers located at:  http://olson-hall.info/  This test is for the qualitative detection of SARS-CoV-2  (COVID19) nucleic acid. Viral nucleic acids may persist in vivo,  independent of viability. Detection of viral nucleic acid does  not imply the presence of infectious virus, or that virus  nucleic acid is the cause of clinical symptoms. Negative  results should be treated as presumptive and, if inconsistent  with clinical signs and symptoms or necessary for patient  management, should be tested with an alternative molecular  assay. Negative results do not preclude SARS-CoV-2 infection  and should not be used as the sole basis for patient  management decisions. Invalid results may be due to inhibiting  substances in the specimen and recollection should occur.         Narrative:      Screening    CULTURE BLOOD AEROBIC AND ANAEROBIC [[id number]] Collected: 07/01/19 2340    Specimen: Blood, Venipuncture Updated: 07/07/19 0721    Narrative:      ORDER#:  [id number]                                    ORDERED BY: Genia Harold, LUBNA  SOURCE: Blood, Venipuncture                          COLLECTED:  07/01/19 23:40  ANTIBIOTICS AT COLL.:                                RECEIVED :  07/02/19 04:54  Culture Blood Aerobic and Anaerobic        FINAL       07/07/19 07:21  07/07/19  No growth after 5 days of incubation.      CULTURE BLOOD AEROBIC AND ANAEROBIC [[id number]] Collected: 07/01/19 2340    Specimen: Blood, Venipuncture Updated: 07/07/19 0721    Narrative:      ORDER#: [id number]                                    ORDERED BY: Genia Harold, LUBNA  SOURCE: Blood, Venipuncture                          COLLECTED:  07/01/19 23:40  ANTIBIOTICS AT COLL.:                                RECEIVED :  07/02/19 04:54  Culture Blood Aerobic and Anaerobic        FINAL       07/07/19 07:21  07/07/19   No growth after 5 days of incubation.      Culture + Gram Stain,Aerobic, Wound [[id number]] Collected: 07/06/19 1826    Specimen: Wound from Abscess Updated: 07/09/19 0850    Narrative:      ORDER#: [id number]                                    ORDERED BY: Rebecka Apley  SOURCE: Abscess groin                                COLLECTED:  07/06/19 18:26  ANTIBIOTICS AT COLL.:                                RECEIVED :  07/06/19 22:59  Stain, Gram                                FINAL       07/07/19 02:32  07/07/19   Few WBCs             No organisms seen  Culture and Gram Stain, Aerobic, Wound     PRELIM      07/09/19 08:50   +  07/09/19   Heavy growth of Staphylococcus aureus               Susceptibility to follow        Culture + Gram Stain,Aerobic, Wound [[id number]] Collected: 07/02/19 0009    Specimen: Wound from Discharge Updated: 07/04/19 0934    Narrative:      ORDER#: [id number]                                    ORDERED BY: Sheppard Coil  SOURCE: Discharge abdominal wound                    COLLECTED:  07/02/19 00:09  ANTIBIOTICS AT COLL.:                                RECEIVED :  07/02/19  04:57  Stain, Gram  FINAL       07/02/19 06:02  07/02/19   Moderate WBCs             Moderate Gram positive cocci             Moderate Gram negative rods             No Squamous epithelial cells seen  Culture and Gram Stain, Aerobic, Wound     FINAL       07/04/19 09:34   +  07/04/19   Heavy growth of Pseudomonas aeruginosa      07/04/19   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      _____________________________________________________________________________                                  P.aeruginosa        MRSA        ANTIBIOTICS                     MIC  INTRP      MIC  INTRP      _____________________________________________________________________________  Amikacin                        <=8    S                        Aztreonam                        8     S                        Cefepime                         4     S                        Ceftazidime                      4     S                        Ciprofloxacin                 <=0.25   S                        Clindamycin                                    <=0.5   S        Daptomycin                                      <=1    S        Erythromycin                                    >4     R  Gentamicin                       4     S        <=2    S        Levofloxacin                   <=0.5   S        >4     R        Linezolid                                       <=1    S        Meropenem                      <=0.5   S                        Oxacillin                                       >2     R  D1    Penicillin                                      >1     R        Piperacillin/Tazobactam         8/4    S                        Rifampin                                       <=0.5   S        Tetracycline                                   <=0.5   S        Tobramycin                      <=2    S                        Trimethoprim/Sulfamethoxazole                 <=0.5/9  S        Vancomycin                                        1     S          -----DRUG COMMENTS----------    D1:  Oxacillin sensitivity predicts sensitivity to cephalosporins.         [place] [employer] 2016  _____________________________________________________________________________            S=SUSCEPTIBLE  I=INTERMEDIATE     R=RESISTANT                            N/S=NON-SUSCEPTIBLE  _____________________________________________________________________________             No results found.      Echo Results     None          RADIOLOGY     Upon my review:    Signed,  Gisele Pack Man Sarahbeth Cashin       This chart was generated using hospital voice-recognition software which does not employ spell-checking or grammar-checking features. It was dictated, all or in part, in a busy and often noisy patient care environment. I have taken all usual measures to dictate carefully and to review all aspects this chart. Nonetheless, given the known and well-documented performance characteristics of VR software in such patient care environments, this dictation still may contain unrecognized and wholly unintended errors or omissions.

## 2019-07-15 LAB — CBC
Absolute NRBC: 0.02 10*3/uL — ABNORMAL HIGH (ref 0.00–0.00)
Hematocrit: 25 % — ABNORMAL LOW (ref 34.7–43.7)
Hgb: 7.3 g/dL — ABNORMAL LOW (ref 11.4–14.8)
MCH: 27.3 pg (ref 25.1–33.5)
MCHC: 29.2 g/dL — ABNORMAL LOW (ref 31.5–35.8)
MCV: 93.6 fL (ref 78.0–96.0)
MPV: 10.8 fL (ref 8.9–12.5)
Nucleated RBC: 0.1 /100 WBC — ABNORMAL HIGH (ref 0.0–0.0)
Platelets: 177 10*3/uL (ref 142–346)
RBC: 2.67 10*6/uL — ABNORMAL LOW (ref 3.90–5.10)
RDW: 14 % (ref 11–15)
WBC: 13.96 10*3/uL — ABNORMAL HIGH (ref 3.10–9.50)

## 2019-07-15 LAB — GLUCOSE WHOLE BLOOD - POCT
Whole Blood Glucose POCT: 151 mg/dL — ABNORMAL HIGH (ref 70–100)
Whole Blood Glucose POCT: 202 mg/dL — ABNORMAL HIGH (ref 70–100)
Whole Blood Glucose POCT: 206 mg/dL — ABNORMAL HIGH (ref 70–100)
Whole Blood Glucose POCT: 227 mg/dL — ABNORMAL HIGH (ref 70–100)

## 2019-07-15 LAB — MAGNESIUM: Magnesium: 1.9 mg/dL (ref 1.6–2.6)

## 2019-07-15 LAB — RENAL FUNCTION PANEL
Albumin: 2.8 g/dL — ABNORMAL LOW (ref 3.5–5.0)
Anion Gap: 11 (ref 5.0–15.0)
BUN: 75 mg/dL — ABNORMAL HIGH (ref 7.0–19.0)
CO2: 25 mEq/L (ref 22–29)
Calcium: 9 mg/dL (ref 8.5–10.5)
Chloride: 99 mEq/L — ABNORMAL LOW (ref 100–111)
Creatinine: 7.2 mg/dL — ABNORMAL HIGH (ref 0.6–1.0)
Glucose: 231 mg/dL — ABNORMAL HIGH (ref 70–100)
Phosphorus: 4.6 mg/dL (ref 2.3–4.7)
Potassium: 5.1 mEq/L (ref 3.5–5.1)
Sodium: 135 mEq/L — ABNORMAL LOW (ref 136–145)

## 2019-07-15 LAB — VANCOMYCIN, RANDOM: Vancomycin Random: 21.1 ug/mL

## 2019-07-15 LAB — GFR: EGFR: 6.9

## 2019-07-15 LAB — HEMOLYSIS INDEX: Hemolysis Index: -2 — ABNORMAL LOW (ref 0–18)

## 2019-07-15 MED ORDER — SODIUM CHLORIDE 0.9 % IV BOLUS
250.0000 mL | INTRAVENOUS | Status: AC | PRN
Start: 2019-07-15 — End: 2019-07-15

## 2019-07-15 MED ORDER — SODIUM CHLORIDE 0.9 % IV BOLUS
100.0000 mL | INTRAVENOUS | Status: AC | PRN
Start: 2019-07-15 — End: 2019-07-15

## 2019-07-15 NOTE — Plan of Care (Signed)
Problem: Renal Instability  Goal: Fluid and electrolyte balance are achieved/maintained  Outcome: Progressing  Flowsheets (Taken 07/12/2019 2123 by Mickel Baas, RN)  Fluid and electrolyte balance are achieved/maintained:   Monitor intake and output every shift   Monitor/assess lab values and report abnormal values   Provide adequate hydration   Assess and reassess fluid and electrolyte status   Observe for cardiac arrhythmias   Assess for confusion/personality changes  Goal: Free from infection  07/15/2019 0818 by Scotty Court, RN  Outcome: Progressing  Flowsheets (Taken 07/12/2019 2123 by Mickel Baas, RN)  Free from infection: Monitor/assess for signs and symptoms of infection  07/15/2019 0817 by Scotty Court, RN  Outcome: Progressing     Problem: Patient Receiving Advanced Renal Therapies  Goal: Therapy access site remains intact  Outcome: Progressing

## 2019-07-15 NOTE — Progress Notes (Signed)
Arrived to pt's room for hD Pt asleep but easily aroused. AOX4 she complain of R arm pain 8/10. Primary nurse at bedside and aware.Time out and safety checks complete. VSS R CVC dressing C/D/I, accessed using aseptic technique. Unable to aspirate arterial port, but flushed easily. HD initiated with lines reversed with good BFR. Using 2 K 2.5 ca bath.  HD nurse will monitor closely.       07/15/19 0725   Permacath Catheter - Tunneled 07/03/19 Right   Placement Date/Time: 07/03/19 1335   Inserted by: Lavone Neri MD  Orientation: Right  Catheter Tip Location: (c) SVC  Central Line Infection Prevention Education provided?: Yes  Hand Hygiene: Chlorhexidine  Line cart used?: No  Line kit used?: Yes  Maximum ...   Line necessity reviewed? Apheresis/hemodialysis   Catheter Lumen Volume Venous 1.7 mL   Catheter Lumen Volume Arterial 1.7 mL   Dressing Status and Intervention Dressing Intact;Clean & Dry   Tego/Curos Caps on Catheter Yes   NEW Tego/Curos Caps placed (Date) 07/14/19   APHERESIS ONLY:  Access  Blue  (reversed due to poor aspiration arterial port)   APHERESIS ONLY: Return Red   End Caps Free From Blood Yes   Line Care Connections checked and tightened   Dressing Type Transparent;Biopatch   Dressing Status Clean;Dry;Intact   Dressing/Line Intervention   (n/a C/D/I)   Dressing Change Assistant ([facility] Only)   (n/a)   Line Used For Blood Draw No   Dressing Change Due 07/17/19   Vitals   Temp 98.3 F (36.8 C)   Heart Rate 66   Resp Rate 18   SpO2 96 %   O2 Device None (Room air)   Machine Metrics   $Treatment Started/Capturing Charge Yes   Blood Flow Rate (mL/min) 150 mL/min   Arterial Pressure (mmHg) -30 mmHg   Venous Pressure (mmHg) 40   Dialysate Flow Rate (mL/min) 700 mL/min   Transmembrane Pressure (mmHg) 30 mmHg   Ultrafiltration Rate (mL/Hr) 700 mL/hr   Fluid Removal (ml) 0 ml   Fluid Bolus (ml) 200 ml   Dialysate K (mEq/L) 2 mEq/L   Dialysate CA (mEq/L) 2.5 mEq/L   Hemodialysis Comments   Arteriovenous Lines Secure  Yes   Comments HD initiated R CVC lines reversed unable to aspirated arterial line

## 2019-07-15 NOTE — Progress Notes (Signed)
INFECTIOUS DISEASES PROGRESS NOTE    Date Time: 07/15/19 4:38 PM  Patient Name: [name],[name]  Patient status.Inpatient  Hospital Day: 13    Assessment and Plan:   1.  Abdominal wall abscess: MRSA and Pseudomonas  2.  Yeast infection      Plan: 1.  Vancomycin for MRSA, Levaquin for Pseudomonas  2.   nystatin and fluconazole for.  Inguinal yeast infection  3.  While awaiting placement patient has completed antibiotic course  Subjective:   Nontoxic    Review of Systems:   Review of Systems -anxiety    Antibiotics:   Day 10  Other medications reviewed in EPIC.  Central Access:       Physical Exam:     Vitals:    07/15/19 1613   BP: 119/72   Pulse: 69   Resp: 16   Temp: 98.6 F (37 C)   SpO2: 97%     Abdominal wall: Cavity is clean.  No drainage                             Yellow rim      Labs:     Microbiology Results     Procedure Component Value Units Date/Time    COVID-19 (SARS-COV-2) [facility]) [[id number]] Collected: 06/30/19 2256    Specimen: Nasopharyngeal Swab from Nasopharynx Updated: 06/30/19 2341     Purpose of COVID testing Screening     SARS-CoV-2 Specimen Source Nasopharyngeal     SARS CoV 2 Overall Result Negative     Comment: Test performed using the Abbott ID NOW EUA assay.  Please see Fact Sheets for patients and providers located at:  http://olson-hall.info/  This test is for the qualitative detection of SARS-CoV-2  (COVID19) nucleic acid. Viral nucleic acids may persist in vivo,  independent of viability. Detection of viral nucleic acid does  not imply the presence of infectious virus, or that virus  nucleic acid is the cause of clinical symptoms. Negative  results should be treated as presumptive and, if inconsistent  with clinical signs and symptoms or necessary for patient  management, should be tested with an alternative molecular  assay. Negative results do not preclude SARS-CoV-2 infection  and should not be used as the sole basis for patient  management decisions.  Invalid results may be due to inhibiting  substances in the specimen and recollection should occur.         Narrative:      Screening    CULTURE BLOOD AEROBIC AND ANAEROBIC [[id number]] Collected: 07/01/19 2340    Specimen: Blood, Venipuncture Updated: 07/07/19 0721    Narrative:      ORDER#: [id number]                                    ORDERED BY: Genia Harold, LUBNA  SOURCE: Blood, Venipuncture                          COLLECTED:  07/01/19 23:40  ANTIBIOTICS AT COLL.:                                RECEIVED :  07/02/19 04:54  Culture Blood Aerobic and Anaerobic        FINAL       07/07/19 07:21  07/07/19   No growth after 5 days of incubation.      CULTURE BLOOD AEROBIC AND ANAEROBIC [[id number]] Collected: 07/01/19 2340    Specimen: Blood, Venipuncture Updated: 07/07/19 0721    Narrative:      ORDER#: [id number]                                    ORDERED BY: Genia Harold, LUBNA  SOURCE: Blood, Venipuncture                          COLLECTED:  07/01/19 23:40  ANTIBIOTICS AT COLL.:                                RECEIVED :  07/02/19 04:54  Culture Blood Aerobic and Anaerobic        FINAL       07/07/19 07:21  07/07/19   No growth after 5 days of incubation.      Culture + Gram Stain,Aerobic, Wound [[id number]] Collected: 07/06/19 1826    Specimen: Wound from Abscess Updated: 07/07/19 0232    Narrative:      ORDER#: [id number]                                    ORDERED BY: Rebecka Apley  SOURCE: Abscess groin                                COLLECTED:  07/06/19 18:26  ANTIBIOTICS AT COLL.:                                RECEIVED :  07/06/19 22:59  Stain, Gram                                FINAL       07/07/19 02:32  07/07/19   Few WBCs             No organisms seen  Culture and Gram Stain, Aerobic, Wound     PENDING      Culture + Gram Stain,Aerobic, Wound [[id number]] Collected: 07/02/19 0009    Specimen: Wound from Discharge Updated: 07/04/19 0934    Narrative:      ORDER#: [id number]                                    ORDERED BY:  Sheppard Coil  SOURCE: Discharge abdominal wound                    COLLECTED:  07/02/19 00:09  ANTIBIOTICS AT COLL.:                                RECEIVED :  07/02/19 04:57  Stain, Gram                                FINAL  07/02/19 06:02  07/02/19   Moderate WBCs             Moderate Gram positive cocci             Moderate Gram negative rods             No Squamous epithelial cells seen  Culture and Gram Stain, Aerobic, Wound     FINAL       07/04/19 09:34   +  07/04/19   Heavy growth of Pseudomonas aeruginosa      07/04/19   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      _____________________________________________________________________________                                  P.aeruginosa        MRSA        ANTIBIOTICS                     MIC  INTRP      MIC  INTRP      _____________________________________________________________________________  Amikacin                        <=8    S                        Aztreonam                        8     S                        Cefepime                         4     S                        Ceftazidime                      4     S                        Ciprofloxacin                 <=0.25   S                        Clindamycin                                    <=0.5   S        Daptomycin                                      <=1    S        Erythromycin                                    >4     R        Gentamicin  4     S        <=2    S        Levofloxacin                   <=0.5   S        >4     R        Linezolid                                       <=1    S        Meropenem                      <=0.5   S                        Oxacillin                                       >2     R  D1    Penicillin                                      >1     R        Piperacillin/Tazobactam         8/4    S                        Rifampin                                       <=0.5   S        Tetracycline                                    <=0.5   S        Tobramycin                      <=2    S                        Trimethoprim/Sulfamethoxazole                 <=0.5/9  S        Vancomycin                                       1     S          -----DRUG COMMENTS----------    D1:  Oxacillin sensitivity predicts sensitivity to cephalosporins.         [place] Ant[employer]committee 2016  _____________________________________________________________________________            S=SUSCEPTIBLE     I=INTERMEDIATE     R=RESISTANT  N/S=NON-SUSCEPTIBLE  _____________________________________________________________________________            CBC w/Diff CMP   Recent Labs   Lab 07/15/19  0332 07/14/19  0535 07/13/19  0600   WBC 13.96* 11.63* 11.43*   Hgb 7.3* 7.5* 7.8*   Hematocrit 25.0* 25.8* 26.1*   Platelets 177 178 176   MCV 93.6 93.1 91.9       PT/INR         Recent Labs   Lab 07/15/19  0332 07/14/19  0535 07/13/19  0600   Sodium 135* 136 137   Potassium 5.1 4.4 4.4   Chloride 99* 100 101   CO2 25 23 26    BUN 75.0* 53.0* 32.0*   Creatinine 7.2* 5.9* 4.4*   Glucose 231* 299* 232*   Calcium 9.0 8.5 8.8   Magnesium 1.9 1.9 1.8   Phosphorus 4.6 3.6 3.6   Albumin 2.8* 2.7* 2.8*      Glucose POCT   Recent Labs   Lab 07/15/19  0332 07/14/19  0535 07/13/19  0600 07/12/19  0319 07/11/19  0500 07/10/19  0624 07/09/19  0353   Glucose 231* 299* 232* 220* 166* 256* 170*          Rads:     Radiology Results (24 Hour)     ** No results found for the last 24 hours. **            Signed by: Ernst Breach

## 2019-07-15 NOTE — Progress Notes (Signed)
CM reach out to SNF([facility] # [phone]), facility stated authorization was submitted today for patient approval, still pending waiting on response.   [name] Mila Homer , MSW [facility]  Social Worker Case Management 1  [phone]  [phone]

## 2019-07-15 NOTE — Discharge Summary (Signed)
MEDICINE DISCHARGE SUMMARY    Date Time: 07/15/19 9:24 PM  Patient Name: [name]  Attending Physician: Forest Becker, MD  Primary Care Physician: Nathanial Rancher, MD    Date of Admission: 06/30/2019  Date of Discharge: 07/14/2019    Discharge Diagnoses:     Principal Problem:    Dizziness  Active Problems:    ESRD (end stage renal disease) on dialysis    Uncontrolled type 2 diabetes mellitus with hyperglycemia    Hypertension    Hyperlipidemia    History of COPD    History of anemia due to chronic kidney disease    Open wound, abdominal wall, lateral, subsequent encounter    Fall  Resolved Problems:    * No resolved hospital problems. *      Disposition:     SNF    Pending Results, Recommendations & Instructions to providers after discharge:     1. Micro / Labs / Path pending: none    Recent Labs - Last 2:       Recent Labs   Lab 07/15/19  0332 07/14/19  0535   WBC 13.96* 11.63*   Hgb 7.3* 7.5*   Hematocrit 25.0* 25.8*   Platelets 177 178           Recent Labs   Lab 07/15/19  0332 07/14/19  0535   Albumin 2.8* 2.7*      Recent Labs   Lab 07/15/19  0332 07/14/19  0535   Sodium 135* 136   Potassium 5.1 4.4   Chloride 99* 100   CO2 25 23   BUN 75.0* 53.0*   Glucose 231* 299*   Calcium 9.0 8.5   Magnesium 1.9 1.9   Phosphorus 4.6 3.6                 Invalid input(s): FREET4         Procedures/Radiology performed:     CT HEAD WO CONTRAST  CT CERVICAL SPINE WO CONTRAST  XR WRIST RIGHT 3+ VIEWS  CT HEAD WO CONTRAST  XR CHEST AP PORTABLE  TUNNELED CATH CHECK/CHANGE [facility])       Hospital Course:     Reason for admission/ HPI/Hospital Course: Patient has been admitted with febrile illness, confusion and proneness to falls.  Patient was found to have abdominal wall cellulitis/wound.  Cultures turned positive methicillin resistant Staphylococcus aureus and Pseudomonas. aeruginosa.  Patient has been treated with combination of IV vancomycin and levofloxacin.  ID note and recommendations appreciated.  Patient also had  been evaluated by plastic surgery.   Patient's lab shows anemia, hemoglobin low at 6.4 repeated hemoglobin 6.8.  Patient is on dialysis.  Considering patient's progress to falls and complaint of dizziness.  s/p 1 unit packed RBCs transfusion.    6/20.  She is waiting for transportation to go home in a.m.  She did not have any new acute events.  She has been on wound care for abdominal wall cellulitis.  Denies fever, chills, nausea, vomiting, chest pain or short of breath.  She is comfortably is laying in bed.   6/21.  She has moderate pain in and around the wound area without fever, chills, nausea, vomiting, diarrhea and urinary complaints.  She is comfortably laying in the bed and she is willing to be discharged today.  I reviewed all her vitals, labs, medication and cultures.     plan    Left upper quadrant abdominal wall cellulitis   - due to MRSA  -  Continue IV vancomycin and oral Levaquin  - follow ID and plastic surgery team  - d/w rimary plastic surgery: Dr. Joan Flores [phone] continue local wound care and follow up in a week at his office  - on Pain management: Acetaminophen and Oxycodone  - no fever, has mild leukocytosis  - wound culture on 6/9--> MRSA and pseudomonas  - wound culture on 6/13--> MRSA and Enterococcal fecalis  - Blood culture on 6/8--> NGTD  - as per ID attending: discharge with 5 days of p.o. Cipro 250 mg daily, doxycycline 100 mg p.o. twice daily and 50 of fluconazole for 5 days  - stable to be discharge today: home Vs SNF today  - follow CM for discharge plan    Mechanical fall  - reviewed X ray of Rt fore arm done on 6/7--> no new fractures, No malalignment   - X ray Rt arm on 6/2--> Unremarkable right humerus  - on fall precaution  - PT/OT eval- --> home Vs SNF placement  - stable to be discharge today       T2DM  - continue basal insulin glargine 30units daily and SSI with lispro  - follow up with PCP      ESRD on HD with malfunctiond tunneled dialysis catheter  -  s/p  exchange catheter on 6/10  - follow up with primary Nephrology team    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 Iron profile suggestive of AOCD    H/o HTN, HLP  - on continue Amlodipine, Coreg, Atorvastatin      Diet: Renal  DVT Prophylaxis: Heparin  Code Status: DNR      Discharge Day Exam:  Temp:  [98.1 F (36.7 C)-98.6 F (37 C)] 98.4 F (36.9 C)  Heart Rate:  [63-72] 71  Resp Rate:  [16-18] 17  BP: (98-137)/(54-73) 112/71    GEN APPEARANCE: NAD.  Pleasant, alert and cooperative.    HEENT: NCAT, EOMI, PERRL, no nasal discharge.   conjunctivae/corneas clear. Oral mucosa moist. Pallor+, no icterus  Neck: Supple without meningismus, no mass/JVD  CVS: RRR, S1, S2. No murmur   LUNGS: CTAB; No wheezes, crackles, rhonchi or rales. HD catheter in anterior chest wall- site- clean and dry.  ABD: BS+, soft, NT, ND, no guarding or rigidity  LUQ wound, small amount of drainage noted   EXT: No edema; Pulses 2+ and intact  SKIN: skin is warm and dry. No rash noted.  MSK: full ROM intact. No joint tenderness or swelling.   NEURO: AAOx3, CN 2-12 intact.  No focal neurological deficits  MENTAL STATUS: appropriate affect and mood        Consultations:     Treatment Team: Attending Provider: Forest Becker, MD; Consulting Physician: Renard Hamper, MD; Consulting Physician: Armando Reichert, MD; Case Manager: Freida Busman; Wound Ostomy Continence Nurse: Hanley Hays, RN; Consulting Physician: Ernst Breach, MD; Consulting Physician: Renelda Loma, MD; Technician: Elberta Leatherwood; Registered Nurse: Sharyon Cable, RN    Discharge Condition:     Improved      Discharge Instructions & Follow Up Plan for Patient:     Diet: diabetic diet- renal diet    Activity/Weight Bearing Status: as tolerated with fall and aspiration precautions    Patient was instructed to follow up with:   Primary Care Doctor Pcp, None, MD in a month & the following Consultants:  1. Dr. Joan Flores- plastic surgery in a  week  2. Dr. Lahoma Crocker- ID attending in a  week  3. Primary Nephrologist in a week    Minutes spent coordinating discharge and reviewing discharge plan: 33 minutes    Discharge Medications:        Medication List      START taking these medications    ciprofloxacin 250 MG tablet  Commonly known as: CIPRO  Take 1 tablet (250 mg total) by mouth daily for 5 days     doxycycline 100 MG capsule  Commonly known as: MONODOX  Take 1 capsule (100 mg total) by mouth every 12 (twelve) hours for 5 days     fluconazole 50 MG tablet  Commonly known as: Diflucan  Take 1 tablet (50 mg total) by mouth daily for 5 days     nystatin powder  Commonly known as: NYSTOP  Apply 1 application topically 2 (two) times daily        CHANGE how you take these medications    insulin aspart 100 UNIT/ML injection  Commonly known as: NovoLOG  Inject 5 Units into the skin 3 (three) times daily before meals  What changed: how much to take     insulin glargine 100 UNIT/ML injection  Commonly known as: LANTUS  Inject 30 Units into the skin every 12 (twelve) hours  What changed:    how much to take   when to take this        CONTINUE taking these medications    amLODIPine 5 MG tablet  Commonly known as: NORVASC  Take 1 tablet (5 mg total) by mouth every 12 (twelve) hours     atorvastatin 40 MG tablet  Commonly known as: LIPITOR     carvedilol 25 MG tablet  Commonly known as: COREG  Take 1 tablet (25 mg total) by mouth every 12 (twelve) hours     collagenase ointment  Commonly known as: SANTYL  Apply topically every 24 hours     famotidine 40 MG tablet  Commonly known as: PEPCID     gabapentin 600 MG tablet  Commonly known as: NEURONTIN     lactobacillus/streptococcus Caps  Take 1 capsule by mouth daily     lidocaine 5 %  Commonly known as: LIDODERM  Place 1 patch onto the skin every 24 hours Remove & Discard patch within 12 hours or as directed by MD     oxyCODONE 5 MG immediate release tablet  Commonly known as: ROXICODONE  Take 1 tablet (5 mg total) by  mouth every 6 (six) hours as needed for Pain     pantoprazole 40 MG tablet  Commonly known as: PROTONIX  Take 1 tablet (40 mg total) by mouth 2 (two) times daily     sevelamer 800 MG tablet  Commonly known as: RENVELA     sodium hypochlorite 0.125 % Soln  Commonly known as: DAKIN'S QUARTER STRENGTH  Irrigate with as directed daily     Trulicity 3.71 GG/2.6RS Sopn  Generic drug: Dulaglutide     vitamin C 500 MG tablet  Commonly known as: ASCORBIC ACID  Take 1 tablet (500 mg total) by mouth daily     zinc Oxide 40 % Pste paste  Commonly known as: DESITIN  Apply topically every 6 (six) hours     zinc sulfate 220 (50 Zn) MG capsule  Commonly known as: ZINCATE  Take 1 capsule (220 mg total) by mouth daily           Where to Get Your Medications      These medications were sent to  [facility] [address], [place] [address]    Phone: [phone]    ciprofloxacin 250 MG tablet   doxycycline 100 MG capsule   fluconazole 50 MG tablet   nystatin powder     Information about where to get these medications is not yet available    Ask your nurse or doctor about these medications   lactobacillus/streptococcus Caps   zinc Oxide 40 % Pste paste                 Signed by: Forest Becker, MD    CC: Pcp, None, MD

## 2019-07-15 NOTE — Plan of Care (Addendum)
Patient alert and oriented X4, able to verbalized needs and call as needed. Patient on room air andontelemetry. Wound dressing completed . Patientable to walk the bathroom with walker,Bed linen and gown changed.Abd wound dressing clean, dry and intact.Fall and safety precautions implemented.        Problem: Pain  Goal: Pain at adequate level as identified by patient  Outcome: Progressing     Problem: Safety  Goal: Patient will be free from injury during hospitalization  Outcome: Progressing  Goal: Patient will be free from infection during hospitalization  Outcome: Progressing     Problem: Pain  Goal: Pain at adequate level as identified by patient  Outcome: Progressing     Problem: Nutrition  Goal: Nutritional intake is adequate  Outcome: Progressing     Problem: Patient Receiving Advanced Renal Therapies  Goal: Therapy access site remains intact  Outcome: Progressing     Problem: Discharge Barriers  Goal: Patient will be discharged home or other facility with appropriate resources  Outcome: Progressing

## 2019-07-15 NOTE — Plan of Care (Addendum)
Alert and orient x 4. Complain pain in the right hand managed by oxycodone. VSS and fall bundle in place. Schedule medication given. Room air, tolerate diet well.  Had dialysis. Dressing changed as per order. Changed gown and bed sheet. Continue to monitor .   Problem: Pain  Goal: Pain at adequate level as identified by patient  Outcome: Progressing  Flowsheets (Taken 07/15/2019 1439)  Pain at adequate level as identified by patient:   Identify patient comfort function goal   Assess for risk of opioid induced respiratory depression, including snoring/sleep apnea. Alert healthcare team of risk factors identified.   Assess pain on admission, during daily assessment and/or before any "as needed" intervention(s)   Reassess pain within 30-60 minutes of any procedure/intervention, per Pain Assessment, Intervention, Reassessment (AIR) Cycle   Evaluate if patient comfort function goal is met     Problem: Safety  Goal: Patient will be free from injury during hospitalization  Outcome: Progressing  Flowsheets (Taken 07/15/2019 1439)  Patient will be free from injury during hospitalization:   Assess patient's risk for falls and implement fall prevention plan of care per policy   Use appropriate transfer methods   Ensure appropriate safety devices are available at the bedside   Include patient/ family/ care giver in decisions related to safety   Hourly rounding   Provide and maintain safe environment  Goal: Patient will be free from infection during hospitalization  Outcome: Progressing  Flowsheets (Taken 07/15/2019 1439)  Free from Infection during hospitalization:   Assess and monitor for signs and symptoms of infection   Monitor lab/diagnostic results     Problem: Pain  Goal: Pain at adequate level as identified by patient  Outcome: Progressing  Flowsheets (Taken 07/15/2019 1439)  Pain at adequate level as identified by patient:   Identify patient comfort function goal   Assess for risk of opioid induced  respiratory depression, including snoring/sleep apnea. Alert healthcare team of risk factors identified.   Assess pain on admission, during daily assessment and/or before any "as needed" intervention(s)   Reassess pain within 30-60 minutes of any procedure/intervention, per Pain Assessment, Intervention, Reassessment (AIR) Cycle   Evaluate if patient comfort function goal is met     Problem: Side Effects from Pain Analgesia  Goal: Patient will experience minimal side effects of analgesic therapy  Outcome: Progressing  Flowsheets (Taken 07/15/2019 1439)  Patient will experience minimal side effects of analgesic therapy:   Monitor/assess patient's respiratory status (RR depth, effort, breath sounds)   Assess for changes in cognitive function     Problem: Compromised Tissue integrity  Goal: Damaged tissue is healing and protected  Outcome: Progressing  Flowsheets (Taken 07/15/2019 1439)  Damaged tissue is healing and protected:   Monitor/assess Braden scale every shift   Provide wound care per wound care algorithm   Reposition patient every 2 hours and as needed unless able to reposition self   Increase activity as tolerated/progressive mobility   Relieve pressure to bony prominences for patients at moderate and high risk   Avoid shearing injuries   Keep intact skin clean and dry  Goal: Nutritional status is improving  Outcome: Progressing  Flowsheets (Taken 07/15/2019 1439)  Nutritional status is improving:   Assist patient with eating   Allow adequate time for meals   Encourage patient to take dietary supplement(s) as ordered     Problem: Diabetes: Glucose Imbalance  Goal: Blood glucose stable at established goal  Outcome: Progressing  Flowsheets (Taken 07/15/2019 1439)  Blood glucose  stable at established goal:   Monitor lab values   Monitor intake and output.  Notify LIP if urine output is < 30 mL/hour.   Follow fluid restrictions/IV/PO parameters   Include patient/family in decisions related to  nutrition/dietary selections     Problem: Renal Instability  Goal: Fluid and electrolyte balance are achieved/maintained  Outcome: Progressing  Flowsheets (Taken 07/15/2019 1439)  Fluid and electrolyte balance are achieved/maintained:   Monitor intake and output every shift   Monitor/assess lab values and report abnormal values   Provide adequate hydration   Assess and reassess fluid and electrolyte status   Assess for confusion/personality changes   Monitor daily weight

## 2019-07-15 NOTE — Progress Notes (Signed)
[facility] PROGRESS NOTE  Aaron Edelman, x [phone] ([place])      Date Time: 07/15/19 3:29 PM  Patient Name: [name]  Attending Physician: Forest Becker, MD    CC: follow-up CKD    Assessment:     1. ESRD- on HD at [facility]  2. Volume overload-much better  3. HTN: Controlled  4. Anemia- Hb stable   5. Secondary HPT  6. Abdominal surgical wound- not healing, MRSA and Pseudomonas  7. Recurrent falls      Recommendations:   S/p HD today, tolerated treatment well with 2L net ultrafiltration  Next HD on Thursday  Possible d/c to [facility] rehab  Repeat x-ray of RUE?    Stable for discharge from renal standpoint    Case discussed with: pt, HD RN    Verl Blalock, MD  [facility]  [phone] (office)  X [phone] [facility])      Subjective:  Right arm pain    Review of Systems:   Cardio: no chest pain, no edema  Pulm: no SOB  GI: no nausea, vomiting or diarrhea    Physical Exam:     Vitals:    07/15/19 1030 07/15/19 1045 07/15/19 1055 07/15/19 1104   BP: 104/54 112/64 133/65 121/62   Pulse: 63 67 65 65   Resp: 18 18 18 18    Temp:    98.4 F (36.9 C)   TempSrc:       SpO2:   97% 97%   Weight:       Height:           Intake and Output Summary (Last 24 hours) at Date Time    Intake/Output Summary (Last 24 hours) at 07/15/2019 1529  Last data filed at 07/15/2019 1104  Gross per 24 hour   Intake 330 ml   Output 2000 ml   Net -1670 ml     General: awake, alert, oriented x 3, no acute distress.  Cardiovascular: regular rate and rhythm  Lungs: clear to auscultation bilaterally, bilateral air entry, normal work of breathing  Abdomen: soft; LLQ wound bandaged  Extremities: no edema  Other: RIJ Permcath    Meds:      Scheduled Meds: PRN Meds:    amLODIPine, 5 mg, Oral, Daily  atorvastatin, 40 mg, Oral, Daily  carvedilol, 25 mg, Oral, Q12H  ciprofloxacin, 250 mg, Oral, Daily at 1800  collagenase, , Topical, Q24H  darbepoetin alfa, 0.45 mcg/kg, Subcutaneous,  Weekly  gabapentin, 300 mg, Oral, Daily  heparin (porcine), 5,000 Units, Subcutaneous, Q8H SCH  insulin glargine, 30 Units, Subcutaneous, QAM  insulin lispro, 1-3 Units, Subcutaneous, QHS  insulin lispro, 1-5 Units, Subcutaneous, TID AC  miconazole 2 % with zinc oxide, , Topical, Q6H  nystatin, 1 application, Topical, BID  pantoprazole, 40 mg, Oral, BID  sevelamer, 800 mg, Oral, TID MEALS  sodium hypochlorite, , Irrigation, Daily  vancomycin, , Intravenous, See Admin Instructions  vitamin C, 500 mg, Oral, Daily  zinc sulfate, 220 mg, Oral, Daily          Continuous Infusions:   sodium chloride, , PRN  acetaminophen, 650 mg, Q6H PRN  dextrose, 15 g of glucose, PRN   And  dextrose, 12.5 g, PRN   And  glucagon (rDNA), 1 mg, PRN  melatonin, 3 mg, QHS PRN  naloxone, 0.2 mg, PRN  ondansetron, 4 mg, Q6H PRN   Or  ondansetron, 4 mg, Q6H PRN  oxyCODONE, 5 mg, Q6H PRN  Labs:     Recent Labs   Lab 07/15/19  0332 07/14/19  0535 07/13/19  0600   WBC 13.96* 11.63* 11.43*   Hgb 7.3* 7.5* 7.8*   Hematocrit 25.0* 25.8* 26.1*   Platelets 177 178 176     Recent Labs   Lab 07/15/19  0332 07/14/19  0535 07/13/19  0600   Sodium 135* 136 137   Potassium 5.1 4.4 4.4   Chloride 99* 100 101   CO2 25 23 26    BUN 75.0* 53.0* 32.0*   Creatinine 7.2* 5.9* 4.4*   Calcium 9.0 8.5 8.8   Albumin 2.8* 2.7* 2.8*   Phosphorus 4.6 3.6 3.6   Magnesium 1.9 1.9 1.8   Glucose 231* 299* 232*   EGFR 6.9 8.7 12.2             Signed by: Verl Blalock, MD

## 2019-07-15 NOTE — Progress Notes (Signed)
HD complete 2 liters removed. R CVC WDL, had to reverse lines unable to aspirate arterial port. Dressing C/D/I. Pt complains of 7/10 when asked, but very drowsy.  Pt asleep duration of tx.  Report given to the primary nurse Samijhana Maharjan RN.         07/15/19 1104   Treatment Summary   Time Off Machine 1055   Duration of Treatment (Hours) 3.5   Treatment Type 1:1   Dialyzer Clearance Moderately streaked   Fluid Volume Off (mL) 2400   Prime Volume (mL) 200   Rinseback Volume (mL) 150   Fluid Given: Normal Saline (mL) 50   Fluid Given: PRBC  0 mL   Fluid Given: Albumin (mL) 0   Fluid Given: Other (mL) 0   Total Fluid Given 400   Hemodialysis Net Fluid Removed 2000   Post Treatment Assessment   Post-Treatment Weight (Kg) -2   Patient Response to Treatment .tolerated well   Additional Dialyzer Used 0   Permacath Catheter - Tunneled 07/03/19 Right   Placement Date/Time: 07/03/19 1335   Inserted by: Lavone Neri MD  Orientation: Right  Catheter Tip Location: (c) SVC  Central Line Infection Prevention Education provided?: Yes  Hand Hygiene: Chlorhexidine  Line cart used?: No  Line kit used?: Yes  Maximum ...   Dressing Status Clean;Dry;Intact   Line Used For Blood Draw No   Dressing Change Due 07/17/19   Vitals   Temp 98.4 F (36.9 C)   Heart Rate 65   Resp Rate 18   BP 121/62   SpO2 97 %   O2 Device None (Room air)   Pain Assessment   Charting Type Reassessment   Numeric Pain Scale   Pain Score 7   POSS Score 3   Pain Location Arm   Pain Orientation Right   Pain Descriptors Aching   Pain Frequency Continuous   Effect of Pain on Daily Activities moderate   Patient's Stated Comfort Functional Goal 0   Pain Intervention(s) Rest   Multiple Pain Sites No   Education   Person taught Patient   Knowledge basis Minimal   Topics taught Access care;Procedure   Teaching Tools Explain   Reponse Verbalizes Understanding   Bedside Nurse Communication   Name of bedside RN - post dialysis Samjhana Maharian RN

## 2019-07-16 LAB — CBC
Absolute NRBC: 0.02 10*3/uL — ABNORMAL HIGH (ref 0.00–0.00)
Hematocrit: 25.2 % — ABNORMAL LOW (ref 34.7–43.7)
Hgb: 7.6 g/dL — ABNORMAL LOW (ref 11.4–14.8)
MCH: 28 pg (ref 25.1–33.5)
MCHC: 30.2 g/dL — ABNORMAL LOW (ref 31.5–35.8)
MCV: 93 fL (ref 78.0–96.0)
MPV: 10.9 fL (ref 8.9–12.5)
Nucleated RBC: 0.2 /100 WBC — ABNORMAL HIGH (ref 0.0–0.0)
Platelets: 147 10*3/uL (ref 142–346)
RBC: 2.71 10*6/uL — ABNORMAL LOW (ref 3.90–5.10)
RDW: 14 % (ref 11–15)
WBC: 13 10*3/uL — ABNORMAL HIGH (ref 3.10–9.50)

## 2019-07-16 LAB — RENAL FUNCTION PANEL
Albumin: 2.7 g/dL — ABNORMAL LOW (ref 3.5–5.0)
Anion Gap: 12 (ref 5.0–15.0)
BUN: 53 mg/dL — ABNORMAL HIGH (ref 7.0–19.0)
CO2: 26 mEq/L (ref 22–29)
Calcium: 9 mg/dL (ref 8.5–10.5)
Chloride: 97 mEq/L — ABNORMAL LOW (ref 100–111)
Creatinine: 5.6 mg/dL — ABNORMAL HIGH (ref 0.6–1.0)
Glucose: 239 mg/dL — ABNORMAL HIGH (ref 70–100)
Phosphorus: 4.7 mg/dL (ref 2.3–4.7)
Potassium: 4.7 mEq/L (ref 3.5–5.1)
Sodium: 135 mEq/L — ABNORMAL LOW (ref 136–145)

## 2019-07-16 LAB — HEMOLYSIS INDEX: Hemolysis Index: 1 (ref 0–18)

## 2019-07-16 LAB — GLUCOSE WHOLE BLOOD - POCT
Whole Blood Glucose POCT: 216 mg/dL — ABNORMAL HIGH (ref 70–100)
Whole Blood Glucose POCT: 219 mg/dL — ABNORMAL HIGH (ref 70–100)
Whole Blood Glucose POCT: 260 mg/dL — ABNORMAL HIGH (ref 70–100)
Whole Blood Glucose POCT: 363 mg/dL — ABNORMAL HIGH (ref 70–100)

## 2019-07-16 LAB — MAGNESIUM: Magnesium: 1.9 mg/dL (ref 1.6–2.6)

## 2019-07-16 LAB — GFR: EGFR: 9.2

## 2019-07-16 MED ORDER — HYDROMORPHONE HCL 1 MG/ML IJ SOLN
1.00 mg | INTRAMUSCULAR | Status: DC | PRN
Start: 2019-07-16 — End: 2019-07-18
  Administered 2019-07-16 – 2019-07-17 (×3): 1 mg via INTRAVENOUS
  Filled 2019-07-16 (×3): qty 1

## 2019-07-16 NOTE — Discharge Summary (Signed)
MEDICINE DISCHARGE SUMMARY    Date Time: 07/16/19 9:40 PM  Patient Name: [name]  Attending Physician: Forest Becker, MD  Primary Care Physician: Nathanial Rancher, MD    Date of Admission: 06/30/2019  Date of Discharge: 07/14/2019    Discharge Diagnoses:     Principal Problem:    Dizziness  Active Problems:    ESRD (end stage renal disease) on dialysis    Uncontrolled type 2 diabetes mellitus with hyperglycemia    Hypertension    Hyperlipidemia    History of COPD    History of anemia due to chronic kidney disease    Open wound, abdominal wall, lateral, subsequent encounter    Fall  Resolved Problems:    * No resolved hospital problems. *      Disposition:     SNF    Pending Results, Recommendations & Instructions to providers after discharge:     1. Micro / Labs / Path pending: none    Recent Labs - Last 2:       Recent Labs   Lab 07/16/19  0434 07/15/19  0332   WBC 13.00* 13.96*   Hgb 7.6* 7.3*   Hematocrit 25.2* 25.0*   Platelets 147 177           Recent Labs   Lab 07/16/19  0434 07/15/19  0332   Albumin 2.7* 2.8*      Recent Labs   Lab 07/16/19  0434 07/15/19  0332   Sodium 135* 135*   Potassium 4.7 5.1   Chloride 97* 99*   CO2 26 25   BUN 53.0* 75.0*   Glucose 239* 231*   Calcium 9.0 9.0   Magnesium 1.9 1.9   Phosphorus 4.7 4.6                 Invalid input(s): FREET4         Procedures/Radiology performed:     CT HEAD WO CONTRAST  CT CERVICAL SPINE WO CONTRAST  XR WRIST RIGHT 3+ VIEWS  CT HEAD WO CONTRAST  XR CHEST AP PORTABLE  TUNNELED CATH CHECK/CHANGE [facility])       Hospital Course:     Reason for admission/ HPI/Hospital Course: Patient has been admitted with febrile illness, confusion and proneness to falls.  Patient was found to have abdominal wall cellulitis/wound.  Cultures turned positive methicillin resistant Staphylococcus aureus and Pseudomonas. aeruginosa.  Patient has been treated with combination of IV vancomycin and levofloxacin.  ID note and recommendations appreciated.  Patient also had  been evaluated by plastic surgery.   Patient's lab shows anemia, hemoglobin low at 6.4 repeated hemoglobin 6.8.  Patient is on dialysis.  Considering patient's progress to falls and complaint of dizziness.  s/p 1 unit packed RBCs transfusion.    6/20.  She is waiting for transportation to go home in a.m.  She did not have any new acute events.  She has been on wound care for abdominal wall cellulitis.  Denies fever, chills, nausea, vomiting, chest pain or short of breath.  She is comfortably is laying in bed.   6/21.  She has moderate pain in and around the wound area without fever, chills, nausea, vomiting, diarrhea and urinary complaints.  She is comfortably laying in the bed and she is willing to be discharged today.  I reviewed all her vitals, labs, medication and cultures.     plan    Left upper quadrant abdominal wall cellulitis   - due to MRSA  -  Continue IV vancomycin and oral Levaquin  - follow ID and plastic surgery team  - d/w rimary plastic surgery: Dr. Joan Flores [phone] continue local wound care and follow up in a week at his office  - on Pain management: Acetaminophen and Oxycodone  - no fever, has mild leukocytosis  - wound culture on 6/9--> MRSA and pseudomonas  - wound culture on 6/13--> MRSA and Enterococcal fecalis  - Blood culture on 6/8--> NGTD  - as per ID attending: discharge with 5 days of p.o. Cipro 250 mg daily, doxycycline 100 mg p.o. twice daily and 50 of fluconazole for 5 days  - stable to be discharge today: home Vs SNF today  - follow CM for discharge plan    Mechanical fall  - reviewed X ray of Rt fore arm done on 6/7--> no new fractures, No malalignment   - X ray Rt arm on 6/2--> Unremarkable right humerus  - on fall precaution  - PT/OT eval- --> home Vs SNF placement  - stable to be discharge today       T2DM  - continue basal insulin glargine 30units daily and SSI with lispro  - follow up with PCP      ESRD on HD with malfunctiond tunneled dialysis catheter  -  s/p  exchange catheter on 6/10  - follow up with primary Nephrology team    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 Iron profile suggestive of AOCD    H/o HTN, HLP  - on continue Amlodipine, Coreg, Atorvastatin      Diet: Renal  DVT Prophylaxis: Heparin  Code Status: DNR      Discharge Day Exam:  Temp:  [97.9 F (36.6 C)-99.7 F (37.6 C)] 99.7 F (37.6 C)  Heart Rate:  [64-77] 76  Resp Rate:  [16-18] 17  BP: (88-137)/(58-75) 137/75    GEN APPEARANCE: NAD.  Pleasant, alert and cooperative.    HEENT: NCAT, EOMI, PERRL, no nasal discharge.   conjunctivae/corneas clear. Oral mucosa moist. Pallor+, no icterus  Neck: Supple without meningismus, no mass/JVD  CVS: RRR, S1, S2. No murmur   LUNGS: CTAB; No wheezes, crackles, rhonchi or rales. HD catheter in anterior chest wall- site- clean and dry.  ABD: BS+, soft, NT, ND, no guarding or rigidity  LUQ wound, small amount of drainage noted   EXT: No edema; Pulses 2+ and intact  SKIN: skin is warm and dry. No rash noted.  MSK: full ROM intact. No joint tenderness or swelling.   NEURO: AAOx3, CN 2-12 intact.  No focal neurological deficits  MENTAL STATUS: appropriate affect and mood        Consultations:     Treatment Team: Attending Provider: Forest Becker, MD; Consulting Physician: Renard Hamper, MD; Consulting Physician: Armando Reichert, MD; Case Manager: Freida Busman; Wound Ostomy Continence Nurse: Hanley Hays, RN; Consulting Physician: Ernst Breach, MD; Consulting Physician: Renelda Loma, MD; Technician: Sheria Lang; Registered Nurse: Laural Golden, RN    Discharge Condition:     Improved      Discharge Instructions & Follow Up Plan for Patient:     Diet: diabetic diet- renal diet    Activity/Weight Bearing Status: as tolerated with fall and aspiration precautions    Patient was instructed to follow up with:   Primary Care Doctor Pcp, None, MD in a month & the following Consultants:  1. Dr. Joan Flores- plastic surgery in a  week  2. Dr. Lahoma Crocker- ID attending in  a week  3. Primary Nephrologist in a week    Minutes spent coordinating discharge and reviewing discharge plan: 33 minutes    Discharge Medications:        Medication List      START taking these medications    ciprofloxacin 250 MG tablet  Commonly known as: CIPRO  Take 1 tablet (250 mg total) by mouth daily for 5 days     doxycycline 100 MG capsule  Commonly known as: MONODOX  Take 1 capsule (100 mg total) by mouth every 12 (twelve) hours for 5 days     fluconazole 50 MG tablet  Commonly known as: Diflucan  Take 1 tablet (50 mg total) by mouth daily for 5 days     nystatin powder  Commonly known as: NYSTOP  Apply 1 application topically 2 (two) times daily        CHANGE how you take these medications    insulin aspart 100 UNIT/ML injection  Commonly known as: NovoLOG  Inject 5 Units into the skin 3 (three) times daily before meals  What changed: how much to take     insulin glargine 100 UNIT/ML injection  Commonly known as: LANTUS  Inject 30 Units into the skin every 12 (twelve) hours  What changed:    how much to take   when to take this        CONTINUE taking these medications    amLODIPine 5 MG tablet  Commonly known as: NORVASC  Take 1 tablet (5 mg total) by mouth every 12 (twelve) hours     atorvastatin 40 MG tablet  Commonly known as: LIPITOR     carvedilol 25 MG tablet  Commonly known as: COREG  Take 1 tablet (25 mg total) by mouth every 12 (twelve) hours     collagenase ointment  Commonly known as: SANTYL  Apply topically every 24 hours     famotidine 40 MG tablet  Commonly known as: PEPCID     gabapentin 600 MG tablet  Commonly known as: NEURONTIN     lactobacillus/streptococcus Caps  Take 1 capsule by mouth daily     lidocaine 5 %  Commonly known as: LIDODERM  Place 1 patch onto the skin every 24 hours Remove & Discard patch within 12 hours or as directed by MD     oxyCODONE 5 MG immediate release tablet  Commonly known as: ROXICODONE  Take 1 tablet (5 mg total) by  mouth every 6 (six) hours as needed for Pain     pantoprazole 40 MG tablet  Commonly known as: PROTONIX  Take 1 tablet (40 mg total) by mouth 2 (two) times daily     sevelamer 800 MG tablet  Commonly known as: RENVELA     sodium hypochlorite 0.125 % Soln  Commonly known as: DAKIN'S QUARTER STRENGTH  Irrigate with as directed daily     Trulicity 1.58 XE/9.4MH Sopn  Generic drug: Dulaglutide     vitamin C 500 MG tablet  Commonly known as: ASCORBIC ACID  Take 1 tablet (500 mg total) by mouth daily     zinc Oxide 40 % Pste paste  Commonly known as: DESITIN  Apply topically every 6 (six) hours     zinc sulfate 220 (50 Zn) MG capsule  Commonly known as: ZINCATE  Take 1 capsule (220 mg total) by mouth daily           Where to Get Your Medications      These medications were sent  to [facility], [address] [place], [place] [address]    Phone: [phone]    ciprofloxacin 250 MG tablet   doxycycline 100 MG capsule   fluconazole 50 MG tablet   nystatin powder     Information about where to get these medications is not yet available    Ask your nurse or doctor about these medications   lactobacillus/streptococcus Caps   zinc Oxide 40 % Pste paste                 Signed by: Forest Becker, MD    CC: Pcp, None, MD

## 2019-07-16 NOTE — Progress Notes (Signed)
CM made contact with [facility] for update on Auth. and spoke with Admission Staff.  Per Admission Staff, the Josem Kaufmann is still pending.

## 2019-07-16 NOTE — Progress Notes (Signed)
Patient complain of right arm pain which is not managed by oxycodone. Doctor notified.

## 2019-07-16 NOTE — Plan of Care (Addendum)
Patient alert and oriented X4, able to verbalized needs and call as needed. Patient on room air andontelemetry.Wound dressing completed .Patientable to walkthe bathroom with walker,Bed linen and gown changed.Abd wound dressing clean, dry and intact.Fall and safety precautions implemented.          Problem: Pain  Goal: Pain at adequate level as identified by patient  Outcome: Progressing     Problem: Safety  Goal: Patient will be free from injury during hospitalization  Outcome: Progressing  Goal: Patient will be free from infection during hospitalization  Outcome: Progressing     Problem: Patient Receiving Advanced Renal Therapies  Goal: Therapy access site remains intact  Outcome: Progressing     Problem: Diabetes: Glucose Imbalance  Goal: Blood glucose stable at established goal  Outcome: Progressing     Problem: Discharge Barriers  Goal: Patient will be discharged home or other facility with appropriate resources  Outcome: Progressing

## 2019-07-16 NOTE — Plan of Care (Signed)
Alert and orient x 4. Complain pain in the right hand managed by oxycodone. VSS and fall bundle in place. Schedule medication given. Room air, tolerate diet well.   Dressing changed as per order. Changed gown and bed sheet. Continue to monitor   Problem: Pain  Goal: Pain at adequate level as identified by patient  Outcome: Progressing  Flowsheets (Taken 07/15/2019 1439)  Pain at adequate level as identified by patient:   Identify patient comfort function goal   Assess for risk of opioid induced respiratory depression, including snoring/sleep apnea. Alert healthcare team of risk factors identified.   Assess pain on admission, during daily assessment and/or before any "as needed" intervention(s)   Reassess pain within 30-60 minutes of any procedure/intervention, per Pain Assessment, Intervention, Reassessment (AIR) Cycle   Evaluate if patient comfort function goal is met     Problem: Safety  Goal: Patient will be free from injury during hospitalization  Outcome: Progressing  Flowsheets (Taken 07/15/2019 1439)  Patient will be free from injury during hospitalization:   Assess patient's risk for falls and implement fall prevention plan of care per policy   Use appropriate transfer methods   Ensure appropriate safety devices are available at the bedside   Include patient/ family/ care giver in decisions related to safety   Hourly rounding   Provide and maintain safe environment  Goal: Patient will be free from infection during hospitalization  Outcome: Progressing  Flowsheets (Taken 07/15/2019 1439)  Free from Infection during hospitalization:   Assess and monitor for signs and symptoms of infection   Monitor lab/diagnostic results     Problem: Side Effects from Pain Analgesia  Goal: Patient will experience minimal side effects of analgesic therapy  Outcome: Progressing  Flowsheets (Taken 07/15/2019 1439)  Patient will experience minimal side effects of analgesic therapy:   Monitor/assess patient's respiratory status (RR  depth, effort, breath sounds)   Assess for changes in cognitive function     Problem: Compromised Tissue integrity  Goal: Damaged tissue is healing and protected  Outcome: Progressing  Flowsheets (Taken 07/15/2019 1439)  Damaged tissue is healing and protected:   Monitor/assess Braden scale every shift   Provide wound care per wound care algorithm   Reposition patient every 2 hours and as needed unless able to reposition self   Increase activity as tolerated/progressive mobility   Relieve pressure to bony prominences for patients at moderate and high risk   Avoid shearing injuries   Keep intact skin clean and dry  Goal: Nutritional status is improving  Outcome: Progressing  Flowsheets (Taken 07/15/2019 1439)  Nutritional status is improving:   Assist patient with eating   Allow adequate time for meals   Encourage patient to take dietary supplement(s) as ordered     Problem: Diabetes: Glucose Imbalance  Goal: Blood glucose stable at established goal  Outcome: Progressing  Flowsheets (Taken 07/15/2019 1439)  Blood glucose stable at established goal:   Monitor lab values   Monitor intake and output.  Notify LIP if urine output is < 30 mL/hour.   Follow fluid restrictions/IV/PO parameters   Include patient/family in decisions related to nutrition/dietary selections     Problem: Renal Instability  Goal: Fluid and electrolyte balance are achieved/maintained  Outcome: Progressing  Flowsheets (Taken 07/15/2019 1439)  Fluid and electrolyte balance are achieved/maintained:   Monitor intake and output every shift   Monitor/assess lab values and report abnormal values   Provide adequate hydration   Assess and reassess fluid and electrolyte status  Assess for confusion/personality changes   Monitor daily weight

## 2019-07-16 NOTE — UM Notes (Addendum)
07/16/2019    MEDICAID HMO/UHC COMMUNITY PLAN CCC PLUS    07/02/19 1500  Admit to Inpatient Once            Date of Service:  07/15/2019 3:29 PM   Creation Time:  07/15/2019 3:29 PM            Signed        Expand AllCollapse All      [] Hide copied text    [] Hover for details    [facility] PROGRESS NOTE  [name] [id number] ([place])      Date Time: 07/15/19 3:29 PM  Patient Name: [name]  Attending Physician: Forest Becker, MD    CC: follow-up CKD    Assessment:     1. ESRD- on HD at [facility]  2. Volume overload-much better  3. HTN: Controlled  4. Anemia- Hb stable   5. Secondary HPT  6. Abdominal surgical wound- not healing, MRSA and Pseudomonas  7. Recurrent falls      Recommendations:   S/p HD today, tolerated treatment well with 2L net ultrafiltration  Next HD on Thursday  Possible d/c to [facility] re[facility]  Repeat x-ray of RUE?              Labs:           Recent Labs   Lab 07/15/19  0332 07/14/19  0535 07/13/19  0600   WBC 13.96* 11.63* 11.43*   Hgb 7.3* 7.5* 7.8*   Hematocrit 25.0* 25.8* 26.1*   Platelets 177 178 176           Recent Labs   Lab 07/15/19  0332 07/14/19  0535 07/13/19  0600   Sodium 135* 136 137   Potassium 5.1 4.4 4.4   Chloride 99* 100 101   CO2 25 23 26    BUN 75.0* 53.0* 32.0*   Creatinine 7.2* 5.9* 4.4*   Calcium 9.0 8.5 8.8   Albumin 2.8* 2.7* 2.8*   Phosphorus 4.6 3.6 3.6   Magnesium 1.9 1.9 1.8   Glucose 231* 299* 232*   EGFR 6.9 8.7 12.2     Date Time: 07/15/19 4:38 PM  Patient Name: [name]  Patient status.Inpatient  Hospital Day: 13    Assessment and Plan:   1.  Abdominal wall abscess: MRSA and Pseudomonas  2.  Yeast infection      Plan: 1.  Vancomycin for MRSA, Levaquin for Pseudomonas  2.   nystatin and fluconazole for.  Inguinal yeast infection  3.  While awaiting placement patient has completed antibiotic course  Date Time: 07/15/19 9:24 PM  Patient Name: [name]  Attending Physician: Forest Becker,  MD  Primary Care Physician: Nathanial Rancher, MD    Date of Admission: 06/30/2019  Date of Discharge: 07/14/2019    Discharge Diagnoses:     Principal Problem:    Dizziness  Active Problems:    ESRD (end stage renal disease) on dialysis    Uncontrolled type 2 diabetes mellitus with hyperglycemia    Hypertension    Hyperlipidemia    History of COPD    History of anemia due to chronic kidney disease    Open wound, abdominal wall, lateral, subsequent encounter    Fall  Resolved Problems:    * No resolved hospital problems. *      Disposition:     SNF  plan    Left upper quadrant abdominal wall cellulitis   - due to MRSA  - Continue  IV vancomycin and oral Levaquin  - follow ID and plastic surgery team  -d/wrimary plastic surgery: Dr. Joan Flores [phone] continue local wound care and follow up in a week at his office  - on Pain management: Acetaminophen and Oxycodone  - no fever, has mild leukocytosis  - wound culture on 6/9--> MRSA and pseudomonas  - wound culture on 6/13--> MRSA and Enterococcal fecalis  - Blood culture on 6/8--> NGTD  - as per ID attending: discharge with 5 days of p.o. Cipro 250 mg daily, doxycycline 100 mg p.o. 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 Iron profile suggestive of AOCD    H/o HTN, HLP  - on continue Amlodipine, Coreg, Atorvastatin      Diet: Renal  DVT Prophylaxis: Heparin  Code Status:  DNR    stable to be discharge today    Discharge Day Exam:  Temp:  [98.1 F (36.7 C)-98.6 F (37 C)] 98.4 F (36.9 C)  Heart Rate:  [63-72] 71  Resp Rate:  [16-18] 17  BP: (98-137)/(54-73) 112/71    07/16/2019  T 99.1, HR 64, RR 16, BP 88/58, WBC 13.00, H/H 7.6/25.2, Glucose 239, Na 135, Chl 97, BUN 53.0, Creatanine 5.6, Albumin 2.7, EGFR 9.2,     Report  Scheduled    amLODIPine (NORVASC) tablet 5 mg   5 mg, PO, Daily   Last Action:  Given, 5 mg at 06/23 0839   atorvastatin (LIPITOR) tablet 40 mg   40 mg, PO, Daily   Last Action:  Given, 40 mg at 06/23 0839   carvedilol (COREG) tablet 25 mg   25 mg, PO, Q12H   Last Action:  Given, 25 mg at 06/23 0604   collagenase (SANTYL) ointment   No Dose/Rate, TP, Q24H   Last Action:  Given, No Dose/Rate at 06/23 1400   darbepoetin alfa (ARANESP) injection 60 mcg   0.45 mcg/kg, SC, Weekly   Last Action:  Given, 60 mcg at 06/21 1801   gabapentin (NEURONTIN) capsule 300 mg   300 mg, PO, Daily   Last Action:  Given, 300 mg at 06/23 0839   heparin (porcine) injection 5,000 Units   5,000 Units, SC, Q8H [facility]   Last Action:  Given, 5,000 Units at 06/23 1547   insulin glargine (LANTUS) injection 30 Units (And Linked Group #1)   30 Units, SC, QAM   Last Action:  Given, 30 Units at 06/23 0838   insulin lispro (HumaLOG) injection 1-3 Units (And Linked Group #2)   1-3 Units, SC, QHS   Last Action:  Given, 1 Units at 06/22 2201   insulin lispro (HumaLOG) injection 1-5 Units (And Linked Group #3)   1-5 Units, SC, TID AC   Last Action:  Given,  2 Units at 06/23 1214   miconazole 2 % with zinc oxide (ANTIFUNGAL) cream   No Dose/Rate, TP, Q6H   Last Action:  Given, No Dose/Rate at 06/23 1214   nystatin (NYSTOP) powder 1 application   1 application, TP, BID   Last Action:  Given, 1 application at 37/16 9678   pantoprazole (PROTONIX) EC tablet 40 mg   40 mg, PO, BID   Last Action:  Given, 40 mg at 06/23 0839   sevelamer (RENVELA) tablet 800 mg   800 mg, PO, TID MEALS   Last Action:  Given, 800  mg at 06/23 1214   sodium hypochlorite (DAKIN'S QUARTER STRENGTH) 0.125 % 0.125% external solution   No Dose/Rate, IR, Daily   Last Action:  Given, No Dose/Rate at 06/23 9381   vitamin C (ASCORBIC ACID) tablet 500 mg   500 mg, PO, Daily   Last Action:  Given, 500 mg at 06/23 0839   zinc sulfate (ZINCATE) capsule 220 mg   220 mg, PO, Daily   Last Action:  Given, 220 mg at 06/23 0841   PRN    0.9% NaCl infusion   20 mL/hr, IV, PRN   Last Action:  Ordered   acetaminophen (TYLENOL) tablet 650 mg   650 mg, PO, Q6H PRN   Last Action:  Given, 650 mg at 06/21 1323   dextrose (GLUCOSE) 40 % oral gel 15 g of glucose (And Linked Group #4)   15 g of glucose, PO, PRN   Last Action:  Ordered   dextrose 50 % bolus 12.5 g (And Linked Group #4)   12.5 g, IV, PRN   Last Action:  Ordered   glucagon (rDNA) (GLUCAGEN) injection 1 mg (And Linked Group #4)   1 mg, IM, PRN   Last Action:  Ordered   melatonin tablet 3 mg   3 mg, PO, QHS PRN   Last Action:  Given, 3 mg at 06/22 2201   naloxone (NARCAN) injection 0.2 mg   0.2 mg, IV, PRN   Last Action:  Ordered   ondansetron (ZOFRAN) injection 4 mg (Or Linked Group #5)   4 mg, IV, Q6H PRN   Last Action:  Given, 4 mg at 06/21 0914   ondansetron (ZOFRAN-ODT) disintegrating tablet 4 mg (Or Linked Group #5)   4 mg, PO, Q6H PRN   Last Action:  See Alternative, 06/21 0914   oxyCODONE (ROXICODONE) immediate release tablet 5 mg   5 mg, PO, Q6H PRN   Last Action:  Given, 5 mg at 06/23 [phone]

## 2019-07-17 LAB — CBC
Absolute NRBC: 0 10*3/uL (ref 0.00–0.00)
Hematocrit: 26.7 % — ABNORMAL LOW (ref 34.7–43.7)
Hgb: 8 g/dL — ABNORMAL LOW (ref 11.4–14.8)
MCH: 27.8 pg (ref 25.1–33.5)
MCHC: 30 g/dL — ABNORMAL LOW (ref 31.5–35.8)
MCV: 92.7 fL (ref 78.0–96.0)
MPV: 11.3 fL (ref 8.9–12.5)
Nucleated RBC: 0 /100 WBC (ref 0.0–0.0)
Platelets: 157 10*3/uL (ref 142–346)
RBC: 2.88 10*6/uL — ABNORMAL LOW (ref 3.90–5.10)
RDW: 14 % (ref 11–15)
WBC: 11.03 10*3/uL — ABNORMAL HIGH (ref 3.10–9.50)

## 2019-07-17 LAB — GLUCOSE WHOLE BLOOD - POCT
Whole Blood Glucose POCT: 205 mg/dL — ABNORMAL HIGH (ref 70–100)
Whole Blood Glucose POCT: 223 mg/dL — ABNORMAL HIGH (ref 70–100)
Whole Blood Glucose POCT: 210 mg/dL — ABNORMAL HIGH (ref 70–100)
Whole Blood Glucose POCT: 162 mg/dL — ABNORMAL HIGH (ref 70–100)

## 2019-07-17 LAB — GFR: EGFR: 6.8

## 2019-07-17 LAB — MAGNESIUM: Magnesium: 2 mg/dL (ref 1.6–2.6)

## 2019-07-17 LAB — RENAL FUNCTION PANEL
Albumin: 2.6 g/dL — ABNORMAL LOW (ref 3.5–5.0)
Anion Gap: 15 (ref 5.0–15.0)
BUN: 77 mg/dL — ABNORMAL HIGH (ref 7.0–19.0)
CO2: 23 mEq/L (ref 22–29)
Calcium: 8.4 mg/dL — ABNORMAL LOW (ref 8.5–10.5)
Chloride: 96 mEq/L — ABNORMAL LOW (ref 100–111)
Creatinine: 7.3 mg/dL — ABNORMAL HIGH (ref 0.6–1.0)
Glucose: 229 mg/dL — ABNORMAL HIGH (ref 70–100)
Phosphorus: 5.2 mg/dL — ABNORMAL HIGH (ref 2.3–4.7)
Potassium: 5.3 mEq/L — ABNORMAL HIGH (ref 3.5–5.1)
Sodium: 134 mEq/L — ABNORMAL LOW (ref 136–145)

## 2019-07-17 LAB — HEMOLYSIS INDEX: Hemolysis Index: 3 (ref 0–18)

## 2019-07-17 MED ORDER — ALBUMIN HUMAN 25 % IV SOLN
100.0000 mL | INTRAVENOUS | Status: AC | PRN
Start: 2019-07-17 — End: 2019-07-17
  Filled 2019-07-17: qty 100

## 2019-07-17 MED ORDER — ALTEPLASE 2 MG IJ SOLR
2.00 mg | Freq: Once | INTRAMUSCULAR | Status: AC
Start: 2019-07-17 — End: 2019-07-17
  Administered 2019-07-17: 12:00:00 2 mg
  Filled 2019-07-17: qty 2

## 2019-07-17 MED ORDER — SODIUM CHLORIDE 0.9 % IV BOLUS
250.0000 mL | INTRAVENOUS | Status: AC | PRN
Start: 2019-07-17 — End: 2019-07-17

## 2019-07-17 MED ORDER — SODIUM CHLORIDE 0.9 % IV BOLUS
100.0000 mL | INTRAVENOUS | Status: AC | PRN
Start: 2019-07-17 — End: 2019-07-17

## 2019-07-17 NOTE — Progress Notes (Signed)
Hand off report received from Prim. RN; Pt. Drowsy, arousable; follows commands. C/O Rt. Arm  Pain, claims she had her pain med given.BP soft; Rt. CVC - art./venous ports w/ resistance on aspiration. Flushed well- able to run at minimal flow. Will closely monitor pt. While on HD.   07/17/19 0912   Bedside Nurse Communication   Name of bedside RN - pre dialysis Tery Sanfilippo   Treatment Initiation- With Dialysis Precautions   Time Out/Safety Check Completed Yes   Consent for HD signed for this hospitalization (Date) 05/23/19   Consent for HD signed for this hospitalization (Time) 1044   Preferred language No   Blood Consent Verified N/A   Dialysis Precautions All Connections Secured;Saline Line Double Clamped;Venous Parameters Set;Arterial Parameters Set;Air Foam Detecctor Engaged   Dialysis Treatment Type Routine;Bedside   Special Considerations contact isolation   Is patient diabetic? Yes   RO/Hemodialysis Architectural technologist   Is Total Chlorine less than 0.1 ppm? Yes   Orignial Total Chlorine Testing Time 0900   At 4 Hour Total Chlorine Testing Time 1300   RO/Hemodialysis Facilities manager Number 7   RO # [id number]   Water Hardness Yes   pH 7.2   Pressure Test Verified Yes   Alarms Verified Passed   Machine Temperature 97.7 F (36.5 C)   Alarms Verified Yes   Na+ mEq (Machine) 138 mEq   Bicarb mEq (Machine) 35 mEq   Hemodialysis Conductivity (Machine) 14.2   Hemodialysis Conductivity (Meter) 14   Dialyzer Lot Number [id number]   Tubing Lot Number [id number]   RO Machine Log Completed Yes   Hepatitis Status   HBsAg (Antigen) Result Negative   HBsAg Date Drawn 07/10/19   HBsAg Repeat Draw Due Date 07/08/19   Dialysis Weight   Pre-Treatment Weight (Kg) 107.9   Scale Type ICU Bed Scale   Vitals   Heart Rate 77   Resp Rate 16   BP 114/72   SpO2 94 %   O2 Device None (Room air)   Assessment   Mental Status Alert;Follows Commands   Cardiac (WDL) WDL   Cardiac Regularity Regular   Cardiac Symptoms None   Cardiac  Rhythm Normal Sinus Rhythm   Respiratory  WDL   Respiratory Pattern Regular;Unlabored   Bilateral Breath Sounds Clear;Diminished   R Breath Sounds Clear   L Breath Sounds Clear   Edema  WDL   Generalized Edema None   Facial Non Pitting Edema   Sacral UTA   RLE Edema None   LLE Edema None   General Skin Color Appropriate for ethnicity   Skin Condition/Temp Warm;Dry   Gastrointestinal (WDL) WDL   Abdomen Inspection Soft;Nondistended   GI Symptoms None   Mobility Ambulatory with Assistance   Permacath Catheter - Tunneled 07/03/19 Right   Placement Date/Time: 07/03/19 1335   Inserted by: Lavone Neri MD  Orientation: Right  Catheter Tip Location: (c) SVC  Central Line Infection Prevention Education provided?: Yes  Hand Hygiene: Chlorhexidine  Line cart used?: No  Line kit used?: Yes  Maximum ...   Line necessity reviewed? Apheresis/hemodialysis   Catheter Lumen Volume Venous 1.7 mL   Catheter Lumen Volume Arterial 1.7 mL   Dressing Status and Intervention Removed Dressing   Tego/Curos Caps on Catheter Yes   NEW Tego/Curos Caps placed (Date) 07/17/19   End Caps Free From Blood Yes   Line Care Connections checked and tightened   Dressing Type Transparent;Biopatch   Dressing Status Clean;Dry  Dressing/Line Intervention Dressing changed   Line Used For Blood Draw No   Dressing Change Due 07/23/19   Pain Assessment   Charting Type Assessment   Pain Scale Used Numeric Scale (0-10)   Numeric Pain Scale   Pain Score 10   POSS Score 2   Pain Location Arm   Pain Orientation Right   Pain Descriptors Aching   Pain Frequency Continuous   Effect of Pain on Daily Activities severe   Patient's Stated Comfort Functional Goal 2   Pain Intervention(s) Medication   Multiple Pain Sites No   Hemodialysis Comments   Pre-Hemodialysis Comments Timeout safety check- done   Hemodialysis History Information   Outpatient Dialysis Unit [facility]   Chronic Dialysis Patient Yes   Outpatient Nephrologist Dr. Vevelyn Francois

## 2019-07-17 NOTE — Progress Notes (Signed)
[facility] PROGRESS NOTE  Levi Aland [id number] ([place])      Date Time: 07/17/19 6:33 AM  Patient Name: [name]  Attending Physician: Forest Becker, MD    CC: follow-up CKD    Assessment:     1. ESRD- on HD at [facility]  2. Volume overload-much better  3. HTN: Controlled  4. Anemia- Hb stable   5. Secondary HPT  6. Abdominal surgical wound- not healing, MRSA and Pseudomonas  7. Recurrent falls      Recommendations:     Hemodialysis today as ordered  Stable from renal standpoint to be discharged after dialysis    Stable for discharge from renal standpoint    Case discussed with: pt, HD RN    Armando Reichert, MD  [facility]  [phone] (office)  X [phone] (FFX Spectra-Link)      Subjective: No complaints today    Review of Systems:   Cardio: no chest pain, no edema  Pulm: no SOB  GI: no nausea, vomiting or diarrhea    Physical Exam:     Vitals:    07/16/19 2052 07/16/19 2300 07/17/19 0415 07/17/19 0603   BP: 137/75 137/75 139/80 104/62   Pulse: 76 77 78 78   Resp: 17 17 18     Temp: 99.7 F (37.6 C) 98.7 F (37.1 C) 99.9 F (37.7 C)    TempSrc: Oral Tympanic Oral    SpO2: 94% 95% 92%    Weight:       Height:           Intake and Output Summary (Last 24 hours) at Date Time    Intake/Output Summary (Last 24 hours) at 07/17/2019 5953  Last data filed at 07/16/2019 1800  Gross per 24 hour   Intake 757 ml   Output 0 ml   Net 757 ml     General: awake, alert, oriented x 3, no acute distress.  Cardiovascular: regular rate and rhythm  Lungs: clear to auscultation bilaterally, bilateral air entry, normal work of breathing  Abdomen: soft; LLQ wound bandaged  Extremities: no edema  Other: RIJ Permcath    Meds:      Scheduled Meds: PRN Meds:    amLODIPine, 5 mg, Oral, Daily  atorvastatin, 40 mg, Oral, Daily  carvedilol, 25 mg, Oral, Q12H  collagenase, , Topical, Q24H  darbepoetin alfa, 0.45 mcg/kg, Subcutaneous, Weekly  gabapentin, 300 mg, Oral, Daily  heparin  (porcine), 5,000 Units, Subcutaneous, Q8H SCH  insulin glargine, 30 Units, Subcutaneous, QAM  insulin lispro, 1-3 Units, Subcutaneous, QHS  insulin lispro, 1-5 Units, Subcutaneous, TID AC  miconazole 2 % with zinc oxide, , Topical, Q6H  nystatin, 1 application, Topical, BID  pantoprazole, 40 mg, Oral, BID  sevelamer, 800 mg, Oral, TID MEALS  sodium hypochlorite, , Irrigation, Daily  vitamin C, 500 mg, Oral, Daily  zinc sulfate, 220 mg, Oral, Daily          Continuous Infusions:   sodium chloride, , PRN  acetaminophen, 650 mg, Q6H PRN  dextrose, 15 g of glucose, PRN   And  dextrose, 12.5 g, PRN   And  glucagon (rDNA), 1 mg, PRN  HYDROmorphone, 1 mg, Q4H PRN  melatonin, 3 mg, QHS PRN  naloxone, 0.2 mg, PRN  ondansetron, 4 mg, Q6H PRN   Or  ondansetron, 4 mg, Q6H PRN  oxyCODONE, 5 mg, Q6H PRN              Labs:  Recent Labs   Lab 07/17/19  0420 07/16/19  0434 07/15/19  0332   WBC 11.03* 13.00* 13.96*   Hgb 8.0* 7.6* 7.3*   Hematocrit 26.7* 25.2* 25.0*   Platelets 157 147 177     Recent Labs   Lab 07/17/19  0420 07/16/19  0434 07/15/19  0332   Sodium 134* 135* 135*   Potassium 5.3* 4.7 5.1   Chloride 96* 97* 99*   CO2 23 26 25    BUN 77.0* 53.0* 75.0*   Creatinine 7.3* 5.6* 7.2*   Calcium 8.4* 9.0 9.0   Albumin 2.6* 2.7* 2.8*   Phosphorus 5.2* 4.7 4.6   Magnesium 2.0 1.9 1.9   Glucose 229* 239* 231*   EGFR 6.8 9.2 6.9             Signed by: Armando Reichert, MD

## 2019-07-17 NOTE — Plan of Care (Addendum)
Problem: Renal Instability  Goal: Fluid and electrolyte balance are achieved/maintained  Flowsheets (Taken 07/17/2019 1041)  Fluid and electrolyte balance are achieved/maintained:   Monitor/assess lab values and report abnormal values   Assess for confusion/personality changes   Monitor daily weight   Observe for cardiac arrhythmias   Follow fluid restrictions/IV/PO parameters   Monitor for muscle weakness   Provide adequate hydration   Assess and reassess fluid and electrolyte status  Goal: Free from infection  Flowsheets (Taken 07/17/2019 1041)  Free from infection: Monitor/assess for signs and symptoms of infection     Problem: Patient Receiving Advanced Renal Therapies  Goal: Therapy access site remains intact  Flowsheets (Taken 07/17/2019 1041)  Therapy access site remains intact:   Assess therapy access site   Change therapy access site dressing as needed

## 2019-07-17 NOTE — Plan of Care (Signed)
Patient is alert and oriented x 4, c/o 10/10 right arm pain. Prn pain medication was given with relief. No distress noted. Vitals are stable. Plan of care discussed with the patient. Safety precautions have been implemented. Will continue to monitor.     Problem: Pain  Goal: Pain at adequate level as identified by patient  Outcome: Progressing  Flowsheets (Taken 07/17/2019 0615)  Pain at adequate level as identified by patient:   Identify patient comfort function goal   Assess for risk of opioid induced respiratory depression, including snoring/sleep apnea. Alert healthcare team of risk factors identified.   Assess pain on admission, during daily assessment and/or before any "as needed" intervention(s)   Reassess pain within 30-60 minutes of any procedure/intervention, per Pain Assessment, Intervention, Reassessment (AIR) Cycle   Evaluate if patient comfort function goal is met   Offer non-pharmacological pain management interventions   Include patient/patient care companion in decisions related to pain management as needed     Problem: Renal Instability  Goal: Fluid and electrolyte balance are achieved/maintained  Outcome: Progressing  Flowsheets (Taken 07/17/2019 0615)  Fluid and electrolyte balance are achieved/maintained:   Monitor intake and output every shift   Monitor/assess lab values and report abnormal values   Assess for confusion/personality changes   Assess and reassess fluid and electrolyte status   Monitor for muscle weakness   Follow fluid restrictions/IV/PO parameters

## 2019-07-17 NOTE — Progress Notes (Signed)
Call from [facility] with request for update on pt's d/c status. Copy of hosp d/c summary faxed to [facility].  [place] continues to wait on auth.     Freida Busman, HD Care Coordinator  [phone]

## 2019-07-17 NOTE — Progress Notes (Signed)
[facility]  PROGRESS NOTE      Patient: [name]  Date: 07/17/2019   LOS: 15 Days  Admission Date: 06/30/2019   MRN: [id number]  Attending: Forest Becker  Please contact me on the following Spectralink [phone]       ASSESSMENT/PLAN     SHERONDA PARRAN is a 64 y.o. female admitted with Dizziness    Interval Summary:     Active Hospital Problems    Diagnosis    Dizziness    Fall    Open wound, abdominal wall, lateral, subsequent encounter    Uncontrolled type 2 diabetes mellitus with hyperglycemia    Hypertension    Hyperlipidemia    History of COPD    History of anemia due to chronic kidney disease    ESRD (end stage renal disease) on dialysis     ESRD on HD with malfunctioning tunneled dialysis catheter s/p exchange 6/10  Abdominal wound infection/cellulitis from prior history of peritoneal dialysis catheter due to MRSA and Pseudomonas s/p completion of IV antibiotics  Mechanical fall  Transient alteration of mental status, resolved  Anemia of chronic kidney disease s/p 1 unit PRBC transfusion  DM2  Hypertension  Hyperlipidemia  Neuropathy, NOS  Tinea intertrigo s/p miconazole  Arm pain      Continues to have right arm pain  Continue dilaudid for breakthrough  Reviewed X-rays  Continue Norvasc 5 mg daily  Continue carvedilol 25 mg twice daily  Continue Lipitor 40 mg daily  Continue gabapentin 300 mg daily  Continue Lantus with SSI and adjust accordingly  Continue Renvela  Continue Protonix          Analgesia: Acetaminophen, oxycodone Dilaudid for breakthrough    Nutrition: Renal    DVT Prophylaxis: Heparin       Code Status: DNR    DISPO: Discharge to SNF    Family Contact: Daughter       Marne states right arm pain.     MEDICATIONS     Current Facility-Administered Medications   Medication Dose Route Frequency    amLODIPine  5 mg Oral Daily    atorvastatin  40 mg Oral Daily    carvedilol  25 mg Oral Q12H    collagenase   Topical Q24H    darbepoetin alfa   0.45 mcg/kg Subcutaneous Weekly    gabapentin  300 mg Oral Daily    heparin (porcine)  5,000 Units Subcutaneous Q8H [place]    insulin glargine  30 Units Subcutaneous QAM    insulin lispro  1-3 Units Subcutaneous QHS    insulin lispro  1-5 Units Subcutaneous TID AC    miconazole 2 % with zinc oxide   Topical Q6H    nystatin  1 application Topical BID    pantoprazole  40 mg Oral BID    sevelamer  800 mg Oral TID MEALS    sodium hypochlorite   Irrigation Daily    vitamin C  500 mg Oral Daily    zinc sulfate  220 mg Oral Daily       PHYSICAL EXAM     Vitals:    07/17/19 1448   BP: 118/69   Pulse: 76   Resp: 14   Temp: 98.1 F (36.7 C)   SpO2: 93%       Temperature: Temp  Min: 97.9 F (36.6 C)  Max: 99.9 F (37.7 C)  Pulse: Pulse  Min: 72  Max: 79  Respiratory:  Resp  Min: 14  Max: 20  Non-Invasive BP: BP  Min: 91/51  Max: 139/80  Pulse Oximetry SpO2  Min: 92 %  Max: 97 %    Intake and Output Summary (Last 24 hours) at Date Time    Intake/Output Summary (Last 24 hours) at 07/17/2019 1558  Last data filed at 07/17/2019 1200  Gross per 24 hour   Intake 630 ml   Output 1550 ml   Net -920 ml         GEN APPEARANCE: Normal;  A&OX3  HEENT: PERLA; EOMI; Conjunctiva Clear  NECK: Supple; No bruits  CVS: RRR, S1, S2; No M/G/R  LUNGS: CTAB; No Wheezes; No Rhonchi: No rales  ABD: Soft; No TTP; + Normoactive BS  EXT: No edema; Pulses 2+ and intact  Skin exam:  pink  NEURO: CN 2-12 intact; No Focal neurological deficits  CAP REFILL:  Normal  MENTAL STATUS:  Normal    Exam done by Forest Becker, MD on 07/17/19 at 3:58 PM      LABS     Recent Labs   Lab 07/17/19  0420 07/16/19  0434 07/15/19  0332   WBC 11.03* 13.00* 13.96*   RBC 2.88* 2.71* 2.67*   Hgb 8.0* 7.6* 7.3*   Hematocrit 26.7* 25.2* 25.0*   MCV 92.7 93.0 93.6   Platelets 157 147 177       Recent Labs   Lab 07/17/19  0420 07/16/19  0434 07/15/19  0332 07/14/19  0535 07/13/19  0600   Sodium 134* 135* 135* 136 137   Potassium 5.3* 4.7 5.1 4.4 4.4   Chloride 96* 97* 99*  100 101   CO2 23 26 25 23 26    BUN 77.0* 53.0* 75.0* 53.0* 32.0*   Creatinine 7.3* 5.6* 7.2* 5.9* 4.4*   Glucose 229* 239* 231* 299* 232*   Calcium 8.4* 9.0 9.0 8.5 8.8   Magnesium 2.0 1.9 1.9 1.9 1.8       Recent Labs   Lab 07/17/19  0420 07/16/19  0434 07/15/19  0332   Albumin 2.6* 2.7* 2.8*                   Microbiology Results     Procedure Component Value Units Date/Time    COVID-19 (SARS-COV-2) [facility]) [[id number]] Collected: 06/30/19 2256    Specimen: Nasopharyngeal Swab from Nasopharynx Updated: 06/30/19 2341     Purpose of COVID testing Screening     SARS-CoV-2 Specimen Source Nasopharyngeal     SARS CoV 2 Overall Result Negative     Comment: Test performed using the Abbott ID NOW EUA assay.  Please see Fact Sheets for patients and providers located at:  http://olson-hall.info/  This test is for the qualitative detection of SARS-CoV-2  (COVID19) nucleic acid. Viral nucleic acids may persist in vivo,  independent of viability. Detection of viral nucleic acid does  not imply the presence of infectious virus, or that virus  nucleic acid is the cause of clinical symptoms. Negative  results should be treated as presumptive and, if inconsistent  with clinical signs and symptoms or necessary for patient  management, should be tested with an alternative molecular  assay. Negative results do not preclude SARS-CoV-2 infection  and should not be used as the sole basis for patient  management decisions. Invalid results may be due to inhibiting  substances in the specimen and recollection should occur.         Narrative:  Screening    CULTURE BLOOD AEROBIC AND ANAEROBIC [[id number]] Collected: 07/01/19 2340    Specimen: Blood, Venipuncture Updated: 07/07/19 0721    Narrative:      ORDER#: [id number]                                    ORDERED BY: Genia Harold, LUBNA  SOURCE: Blood, Venipuncture                          COLLECTED:  07/01/19 23:40  ANTIBIOTICS AT COLL.:                                 RECEIVED :  07/02/19 04:54  Culture Blood Aerobic and Anaerobic        FINAL       07/07/19 07:21  07/07/19   No growth after 5 days of incubation.      CULTURE BLOOD AEROBIC AND ANAEROBIC [[id number]] Collected: 07/01/19 2340    Specimen: Blood, Venipuncture Updated: 07/07/19 0721    Narrative:      ORDER#: [id number]                                    ORDERED BY: Genia Harold, LUBNA  SOURCE: Blood, Venipuncture                          COLLECTED:  07/01/19 23:40  ANTIBIOTICS AT COLL.:                                RECEIVED :  07/02/19 04:54  Culture Blood Aerobic and Anaerobic        FINAL       07/07/19 07:21  07/07/19   No growth after 5 days of incubation.      Culture + Gram Stain,Aerobic, Wound [[id number]] Collected: 07/06/19 1826    Specimen: Wound from Abscess Updated: 07/10/19 0959    Narrative:      ORDER#: [id number]                                    ORDERED BY: Rebecka Apley  SOURCE: Abscess groin                                COLLECTED:  07/06/19 18:26  ANTIBIOTICS AT COLL.:                                RECEIVED :  07/06/19 22:59  Stain, Gram                                FINAL       07/07/19 02:32  07/07/19   Few WBCs             No organisms seen  Culture and Gram Stain, Aerobic, Wound     FINAL       07/10/19 09:59   +  07/09/19  Light growth of mixed cutaneous flora  07/09/19   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      07/10/19   Light growth of Enterococcus faecalis      _____________________________________________________________________________                                      MRSA         E.faecalis     ANTIBIOTICS                     MIC  INTRP      MIC  INTRP      _____________________________________________________________________________  Ampicillin                                       1     S        Clindamycin                    <=0.5   S                        Daptomycin                      <=1    S                        Erythromycin                    >4     R                         Gentamicin                      <=2    S                        Gentamicin High Level Resistan                 <=500   S        Levofloxacin                    >4     R                        Linezolid                        2     S                        Oxacillin                       >2     R  D1                    Penicillin                      >1     R         4     S  Rifampin                       <=0.5   S                        Streptomycin High Level Resist                 >1000   R        Tetracycline                   <=0.5   S                        Trimethoprim/Sulfamethoxazole <=0.5/9  S                        Vancomycin                       1     S         1     S          -----DRUG COMMENTS----------    D1:  Oxacillin sensitivity predicts sensitivity to cephalosporins.         [place] [employer] 2016  _____________________________________________________________________________            S=SUSCEPTIBLE     I=INTERMEDIATE     R=RESISTANT                            N/S=NON-SUSCEPTIBLE  _____________________________________________________________________________      Culture + Gram Stain,Aerobic, Wound [[id number]] Collected: 07/02/19 0009    Specimen: Wound from Discharge Updated: 07/04/19 0934    Narrative:      ORDER#: [id number]                                    ORDERED BY: Sheppard Coil  SOURCE: Discharge abdominal wound                    COLLECTED:  07/02/19 00:09  ANTIBIOTICS AT COLL.:                                RECEIVED :  07/02/19 04:57  Stain, Gram                                FINAL       07/02/19 06:02  07/02/19   Moderate WBCs             Moderate Gram positive cocci             Moderate Gram negative rods             No Squamous epithelial cells seen  Culture and Gram Stain, Aerobic, Wound     FINAL       07/04/19 09:34   +  07/04/19   Heavy growth of Pseudomonas aeruginosa      07/04/19   Heavy growth of Methicillin Resistant Staph  aureus      _____________________________________________________________________________  P.aeruginosa        MRSA        ANTIBIOTICS                     MIC  INTRP      MIC  INTRP      _____________________________________________________________________________  Amikacin                        <=8    S                        Aztreonam                        8     S                        Cefepime                         4     S                        Ceftazidime                      4     S                        Ciprofloxacin                 <=0.25   S                        Clindamycin                                    <=0.5   S        Daptomycin                                      <=1    S        Erythromycin                                    >4     R        Gentamicin                       4     S        <=2    S        Levofloxacin                   <=0.5   S        >4     R        Linezolid                                       <=1    S        Meropenem                      <=  0.5   S                        Oxacillin                                       >2     R  D1    Penicillin                                      >1     R        Piperacillin/Tazobactam         8/4    S                        Rifampin                                       <=0.5   S        Tetracycline                                   <=0.5   S        Tobramycin                      <=2    S                        Trimethoprim/Sulfamethoxazole                 <=0.5/9  S        Vancomycin                                       1     S          -----DRUG COMMENTS----------    D1:  Oxacillin sensitivity predicts sensitivity to cephalosporins.         [place] [employer]committee 2016  _____________________________________________________________________________            S=SUSCEPTIBLE     I=INTERMEDIATE     R=RESISTANT                             N/S=NON-SUSCEPTIBLE  _____________________________________________________________________________             No results found.      Echo Results     None          RADIOLOGY     Upon my review:    Signed,  Forest Becker  3:58 PM 07/17/2019

## 2019-07-17 NOTE — PT Progress Note (Signed)
Physical Therapy Note    [facility]  Physical Therapy Attempt Note    Patient:  [name] MRN#:  [id number]  Unit:  [address] Room/Bed:  A2314/A2314.01      Physical Therapy treatment attempted on 07/17/2019 at 2:31 PM unable to complete secondary to patient declining 2/2 to increased pain "my arm hurts, I can't do it". Patient education per importance of physical therapy in order to progress toward prior level of function with patient continuing to decline. Will follow up at a later time as schedule allows and when patient is appropriate.     RN aware.      Adah Salvage, [place] Lic# [id number]    Physical Medicine and [facility]  [phone]    07/17/2019  2:33 PM

## 2019-07-17 NOTE — Progress Notes (Signed)
CM made follow up with [facility] and spoke with Admission.  Per Admission, they are still awaiting Insurance Auth.  CM also made call to [place] [phone] and left message requiring on the status of the Auth.  CM will continue to follow.

## 2019-07-17 NOTE — UM Notes (Signed)
[employer] COMMUNITY PLAN [facility] PLUS    CONCURRENT REVIEW July 17, 2019      PATIENT NAME: [name]  DOB: March 13, 1955      History of present illness: Pt is a 64 y.o. female arrived at [facility] on 06/30/2019 at 2225.and admitted to 23S    Pt discharge ordered since 6/21 still awaiting Insurance Auth.      BP 103/57    Pulse 76    Temp 99.5 F (37.5 C) (Oral)    Resp 18    Ht 1.575 m (5\' 2" )    Wt 106.9 kg (235 lb 10.8 oz)    LMP  (LMP Unknown) Comment: post menopausal   SpO2 93%    BMI 43.10 kg/m     Temp:  [98.1 F (36.7 C)-99.9 F (37.7 C)]   Heart Rate:  [67-79]   Resp Rate:  [14-20]   BP: (91-139)/(45-80)   SpO2:  [92 %-95 %]     Last recorded pain score:  Pain Scale Used: Numeric Scale (0-10)  Pain Score: 10-severe pain     Lab Results last 48 Hours     Procedure Component Value Units Date/Time    Glucose Whole Blood - POCT [[id number]]  (Abnormal) Collected: 07/17/19 1621     Updated: 07/17/19 1630     Whole Blood Glucose POCT 210 mg/dL     Glucose Whole Blood - POCT [[id number]]  (Abnormal) Collected: 07/17/19 1223     Updated: 07/17/19 1228     Whole Blood Glucose POCT 223 mg/dL     Glucose Whole Blood - POCT [[id number]]  (Abnormal) Collected: 07/17/19 0742     Updated: 07/17/19 0750     Whole Blood Glucose POCT 205 mg/dL     GFR [[id number]] Collected: 07/17/19 0420     Updated: 07/17/19 0537     EGFR 6.8    Renal function panel [[id number]]  (Abnormal) Collected: 07/17/19 0420    Specimen: Blood Updated: 07/17/19 0537     Glucose 229 mg/dL      Sodium 134 mEq/[name]      Potassium 5.3 mEq/[name]      Chloride 96 mEq/[name]      BUN 77.0 mg/dL      Calcium 8.4 mg/dL      Creatinine 7.3 mg/dL      Albumin 2.6 g/dL      Phosphorus 5.2 mg/dL     CBC without differential [[id number]]  (Abnormal) Collected: 07/17/19 0420    Specimen: Blood Updated: 07/17/19 0518     WBC 11.03 x10 3/uL      Hgb 8.0 g/dL      Hematocrit 26.7 %      RBC 2.88 x10 6/uL      MCHC 30.0 g/dL     Glucose Whole Blood - POCT [[id number]]  (Abnormal) Collected:  07/16/19 2054     Updated: 07/16/19 2059     Whole Blood Glucose POCT 363 mg/dL     Glucose Whole Blood - POCT [[id number]]  (Abnormal) Collected: 07/16/19 1608     Updated: 07/16/19 1616     Whole Blood Glucose POCT 260 mg/dL     Glucose Whole Blood - POCT [[id number]]  (Abnormal) Collected: 07/16/19 1155     Updated: 07/16/19 1202     Whole Blood Glucose POCT 219 mg/dL     Glucose Whole Blood - POCT [[id number]]  (Abnormal) Collected: 07/16/19 0801     Updated: 07/16/19 0818     Whole Blood Glucose POCT 216 mg/dL  Magnesium [[id number]] Collected: 07/16/19 0434    Specimen: Blood Updated: 07/16/19 0545     Magnesium 1.9 mg/dL     Renal function panel [[id number]]  (Abnormal) Collected: 07/16/19 0434    Specimen: Blood Updated: 07/16/19 0545     Glucose 239 mg/dL      Sodium 135 mEq/[name]      Chloride 97 mEq/[name]      BUN 53.0 mg/dL      Creatinine 5.6 mg/dL      Albumin 2.7 g/dL     GFR [[id number]] Collected: 07/16/19 0434     Updated: 07/16/19 0545     EGFR 9.2    CBC without differential [[id number]]  (Abnormal) Collected: 07/16/19 0434    Specimen: Blood Updated: 07/16/19 0523     WBC 13.00 x10 3/uL      Hgb 7.6 g/dL      Hematocrit 25.2 %      RBC 2.71 x10 6/uL      MCHC 30.2 g/dL      Nucleated RBC 0.2 /100 WBC      Absolute NRBC 0.02 x10 3/uL             MD Notes:  ASSESSMENT/PLAN  [name] is a 64 y.o. female admitted with Dizziness  Interval Summary:   Active Hospital Problems  Diagnosis  Dizziness  Fall  Open wound, abdominal wall, lateral, subsequent encounter  Uncontrolled type 2 diabetes mellitus with hyperglycemia  Hypertension  Hyperlipidemia  History of COPD  History of anemia due to chronic kidney disease  ESRD (end stage renal disease) on dialysis  ESRD on HD with malfunctioning tunneled dialysis catheter s/p exchange 6/10  Abdominal wound infection/cellulitis from prior history of peritoneal dialysis catheter due to MRSA and Pseudomonas s/p completion of IV antibiotics  Mechanical fall   Transient alteration of mental status, resolved  Anemia of chronic kidney disease s/p 1 unit PRBC transfusion  DM2  Hypertension  Hyperlipidemia  Neuropathy, NOS  Tinea intertrigo s/p miconazole  Arm pain  Continues to have right arm pain  Continue dilaudid for breakthrough  Reviewed X-rays  Continue Norvasc 5 mg daily  Continue carvedilol 25 mg twice daily  Continue Lipitor 40 mg daily  Continue gabapentin 300 mg daily  Continue Lantus with SSI and adjust accordingly  Continue Renvela  Continue Protonix  Analgesia: Acetaminophen, oxycodone Dilaudid for breakthrough    Nephrology 07/17/2019  Assessment:  1. ESRD- on HD at [facility]  2. Volume overload-much better  3. HTN: Controlled  4. Anemia- Hb stable   5. Secondary HPT  6. Abdominal surgical wound- not healing, MRSA and Pseudomonas  7. Recurrent falls  Recommendations:  Hemodialysis today as ordered  Stable from renal standpoint to be discharged after dialysis  Stable for discharge from renal standpoint          Medications: Scheduled Meds:  Current Facility-Administered Medications   Medication Dose Route Frequency    amLODIPine  5 mg Oral Daily    atorvastatin  40 mg Oral Daily    carvedilol  25 mg Oral Q12H    collagenase   Topical Q24H    darbepoetin alfa  0.45 mcg/kg Subcutaneous Weekly    gabapentin  300 mg Oral Daily    heparin (porcine)  5,000 Units Subcutaneous Q8H [place]    insulin glargine  30 Units Subcutaneous QAM    insulin lispro  1-3 Units Subcutaneous QHS    insulin lispro  1-5 Units Subcutaneous TID AC    miconazole  2 % with zinc oxide   Topical Q6H    nystatin  1 application Topical BID    pantoprazole  40 mg Oral BID    sevelamer  800 mg Oral TID MEALS    sodium hypochlorite   Irrigation Daily    vitamin C  500 mg Oral Daily    zinc sulfate  220 mg Oral Daily     Continuous Infusions:  PRN Meds:.sodium chloride, acetaminophen, Nursing communication: Adult Hypoglycemia Treatment Algorithm **AND** dextrose **AND**  dextrose **AND** glucagon (rDNA), HYDROmorphone, melatonin, naloxone, ondansetron **OR** ondansetron, oxyCODONE      This clinical review is based on compiled from documentation provided by the treatment team within the patient's medical record.    UTILIZATION REVIEW CONTACT : Shanon Payor BSN, RN, ACM  Utilization Review Case Manager II / Case Management  [facility]  [address]  [place], [place]  T [phone]  Jerilynn Mages [phone]   F [phone]    Email: [email or web address]  NPI: [id number] Tax ID:  [id number]

## 2019-07-17 NOTE — Progress Notes (Signed)
2 hrs HD tx. Done today fairly tolerated; BP  Was on the 90's - low100's; Rt. CVC did not run well; Addressed to Dr. Vevelyn Francois- ordered for [facility] for both ports, to dwell overnight. Pt. For HD again tomorrow. Net fluid off 1.5L; Pt. More awake post HD. Report given to Tery Sanfilippo RN.   07/17/19 1200   Treatment Summary   Time Off Machine 1150   Duration of Treatment (Hours) 2.0   Treatment Type 1:1   Dialyzer Clearance Moderately streaked   Fluid Volume Off (mL) 2100   Prime Volume (mL) 200   Rinseback Volume (mL) 200   Fluid Given: Normal Saline (mL) 150   Fluid Given: PRBC  0 mL   Fluid Given: Albumin (mL) 0   Fluid Given: Other (mL) 0   Total Fluid Given 550   Hemodialysis Net Fluid Removed 1550   Post Treatment Assessment   Post-Treatment Weight (Kg) -1.5   Patient Response to Treatment fairly tolerated   Information for Next Treatment TPA indwelled t o A/V ports of Rt. CVC   Additional Dialyzer Used 0   Permacath Catheter - Tunneled 07/03/19 Right   Placement Date/Time: 07/03/19 1335   Inserted by: Lavone Neri MD  Orientation: Right  Catheter Tip Location: (c) SVC  Central Line Infection Prevention Education provided?: Yes  Hand Hygiene: Chlorhexidine  Line cart used?: No  Line kit used?: Yes  Maximum ...   Line necessity reviewed? Apheresis/hemodialysis   Catheter Lumen Volume Venous 1.7 mL   Catheter Lumen Volume Arterial 1.7 mL   Dressing Status and Intervention Dressing Intact   Tego/Curos Caps on Catheter Yes   NEW Tego/Curos Caps placed (Date) 07/17/19   End Caps Free From Blood Yes   Line Care Connections checked and tightened   Dressing Type Transparent;Biopatch   Dressing Status Clean;Dry   Dressing/Line Intervention Dressing changed   Dressing Change Assistant ([facility] Only) Tery Sanfilippo   Line Used For Blood Draw No   Dressing Change Due 07/23/19   Vitals   Temp 98.9 F (37.2 C)   Heart Rate 78   Resp Rate 18   BP 116/50   SpO2 94 %   O2 Device None (Room air)   Assessment   Mental Status  Alert;Oriented;Cooperative   Cardiac (WDL) WDL   Cardiac Regularity Regular   Cardiac Symptoms None   Cardiac Rhythm Normal Sinus Rhythm   Respiratory  WDL   Respiratory Pattern Regular;Unlabored   Bilateral Breath Sounds Clear   R Breath Sounds Clear   L Breath Sounds Clear   Edema  WDL   Generalized Edema Non Pitting Edema   Facial None   Sacral UTA   RLE Edema Non Pitting Edema   LLE Edema Non Pitting Edema   General Skin Color Appropriate for ethnicity   Skin Condition/Temp Warm;Dry   Gastrointestinal (WDL) WDL   Abdomen Inspection Soft;Nondistended   GI Symptoms None   Mobility Ambulatory with Assistance   Pain Assessment   Charting Type Assessment   Pain Scale Used Numeric Scale (0-10)   Numeric Pain Scale   Pain Score 7   POSS Score 1   Pain Location Arm   Pain Orientation Right   Pain Descriptors Aching   Pain Frequency Continuous   Effect of Pain on Daily Activities severe   Patient's Stated Comfort Functional Goal 2   Pain Intervention(s) Repositioned   Multiple Pain Sites No   Education   Person taught Patient   Knowledge basis Minimal  Topics taught Access care;Fluid Management   Teaching Tools Explain   [name] Understanding   Bedside Nurse Communication   Name of bedside RN - post dialysis Tery Sanfilippo

## 2019-07-18 LAB — CBC
Absolute NRBC: 0 10*3/uL (ref 0.00–0.00)
Hematocrit: 25.6 % — ABNORMAL LOW (ref 34.7–43.7)
Hgb: 7.5 g/dL — ABNORMAL LOW (ref 11.4–14.8)
MCH: 27.3 pg (ref 25.1–33.5)
MCHC: 29.3 g/dL — ABNORMAL LOW (ref 31.5–35.8)
MCV: 93.1 fL (ref 78.0–96.0)
MPV: 11 fL (ref 8.9–12.5)
Nucleated RBC: 0 /100 WBC (ref 0.0–0.0)
Platelets: 144 10*3/uL (ref 142–346)
RBC: 2.75 10*6/uL — ABNORMAL LOW (ref 3.90–5.10)
RDW: 15 % (ref 11–15)
WBC: 10.71 10*3/uL — ABNORMAL HIGH (ref 3.10–9.50)

## 2019-07-18 LAB — RENAL FUNCTION PANEL
Albumin: 2.5 g/dL — ABNORMAL LOW (ref 3.5–5.0)
Anion Gap: 15 (ref 5.0–15.0)
BUN: 73 mg/dL — ABNORMAL HIGH (ref 7.0–19.0)
CO2: 23 mEq/L (ref 22–29)
Calcium: 8.8 mg/dL (ref 8.5–10.5)
Chloride: 98 mEq/L — ABNORMAL LOW (ref 100–111)
Creatinine: 7.3 mg/dL — ABNORMAL HIGH (ref 0.6–1.0)
Glucose: 141 mg/dL — ABNORMAL HIGH (ref 70–100)
Phosphorus: 5.5 mg/dL — ABNORMAL HIGH (ref 2.3–4.7)
Potassium: 5.3 mEq/L — ABNORMAL HIGH (ref 3.5–5.1)
Sodium: 136 mEq/L (ref 136–145)

## 2019-07-18 LAB — GLUCOSE WHOLE BLOOD - POCT
Whole Blood Glucose POCT: 222 mg/dL — ABNORMAL HIGH (ref 70–100)
Whole Blood Glucose POCT: 165 mg/dL — ABNORMAL HIGH (ref 70–100)

## 2019-07-18 LAB — COVID-19 (SARS-COV-2): SARS CoV 2 Overall Result: NEGATIVE

## 2019-07-18 LAB — HEMOLYSIS INDEX: Hemolysis Index: -2 — ABNORMAL LOW (ref 0–18)

## 2019-07-18 LAB — MAGNESIUM: Magnesium: 2.1 mg/dL (ref 1.6–2.6)

## 2019-07-18 LAB — GFR: EGFR: 6.8

## 2019-07-18 MED ORDER — GABAPENTIN 300 MG PO CAPS
300.00 mg | ORAL_CAPSULE | Freq: Every day | ORAL | 0 refills | Status: DC
Start: 2019-07-18 — End: 2019-07-28

## 2019-07-18 MED ORDER — ALBUMIN HUMAN 25 % IV SOLN
100.0000 mL | INTRAVENOUS | Status: AC | PRN
Start: 2019-07-18 — End: 2019-07-18
  Administered 2019-07-18: 11:00:00 100 mL via INTRAVENOUS
  Filled 2019-07-18: qty 100

## 2019-07-18 MED ORDER — SODIUM CHLORIDE 0.9 % IV BOLUS
250.0000 mL | INTRAVENOUS | Status: AC | PRN
Start: 2019-07-18 — End: 2019-07-18

## 2019-07-18 MED ORDER — SODIUM CHLORIDE 0.9 % IV BOLUS
100.0000 mL | INTRAVENOUS | Status: AC | PRN
Start: 2019-07-18 — End: 2019-07-18

## 2019-07-18 MED ORDER — OXYCODONE HCL 5 MG PO TABS
5.0000 mg | ORAL_TABLET | Freq: Four times a day (QID) | ORAL | 0 refills | Status: DC | PRN
Start: 2019-07-18 — End: 2019-07-28

## 2019-07-18 NOTE — Progress Notes (Signed)
VM from [place] @ [place] with request for HD clinicals for pt.  HD flow sheet, HBsAg and CXR were subsequently attached to referral in AllScripts and forwarded     Freida Busman, HD Care Coordinator  [phone]

## 2019-07-18 NOTE — Plan of Care (Addendum)
Medicine Nursing Note    Date Time: 07/18/19 1:53 AM  Patient Name: [name]  Attending Physician: Forest Becker, MD    Assessment     Shift event: Wound care; blood sugar monitoring; CHG    Neuro: A&O x 4, Calm and Cooperative    Tele/CV/CP No Tele, VSS, RA, Afebrile   Foley/GU N/A Pt voiding (external cath)   BMs/GI Last BM , on Renal 80 GM protein restriction diet   Skin: Abd wound, peri area redness/rash   Isolation: N/A   Pain: Denied pain/discomfort   MEWS: 1   Central Line: N/A   Safety:  Placed high fall risk. Safety maintained, purposeful rounding performed, Bed alarm activated; Call bell within reach.    Interpreter: Pt speaks English and denies interpreter needs.   Comments:  Wound dressing on abd cleansed/cleaned and new dressing applied. CHG and gown change done. Blood sugar monitoring w/ SSI; didn't need to provide coverage.   Medications given per order. Wound care done to peri area as well.     Pt will have dialysis tomorrow 6/25.    Provided comfort including offers of repositioning, pain medication and heat/cold packs.   Oral fluids offered. Hygiene supply provided.     No c/o Chest Pain  No c/o Cough and SOB.  No acute distress noted with breathing.  Denied Nausea and Vomiting.       The whiteboard was updated with current team information.   The belongings are at bedside.   Discussed and explained the care plan for the night.   Explained and educated Pt on safety precaution.              Problem: Compromised Tissue integrity  Goal: Damaged tissue is healing and protected  Outcome: Progressing  Flowsheets (Taken 07/15/2019 1439 by Irene Shipper, RN)  Damaged tissue is healing and protected:   Monitor/assess Braden scale every shift   Provide wound care per wound care algorithm   Reposition patient every 2 hours and as needed unless able to reposition self   Increase activity as tolerated/progressive mobility   Relieve pressure to bony prominences for patients at moderate and  high risk   Avoid shearing injuries   Keep intact skin clean and dry     Problem: Diabetes: Glucose Imbalance  Goal: Blood glucose stable at established goal  Outcome: Progressing  Flowsheets (Taken 07/15/2019 1439 by Irene Shipper, RN)  Blood glucose stable at established goal:   Monitor lab values   Monitor intake and output.  Notify LIP if urine output is < 30 mL/hour.   Follow fluid restrictions/IV/PO parameters   Include patient/family in decisions related to nutrition/dietary selections     Problem: Renal Instability  Goal: Fluid and electrolyte balance are achieved/maintained  Outcome: Progressing  Flowsheets (Taken 07/17/2019 1041 by Mickel Baas, RN)  Fluid and electrolyte balance are achieved/maintained:   Monitor/assess lab values and report abnormal values   Assess for confusion/personality changes   Monitor daily weight   Observe for cardiac arrhythmias   Follow fluid restrictions/IV/PO parameters   Monitor for muscle weakness   Provide adequate hydration   Assess and reassess fluid and electrolyte status

## 2019-07-18 NOTE — Progress Notes (Signed)
Pt will D/C to [facility] as requested and will also receive HD.  CM made referral to PTS for a pick at 4:00 pm. Bedside RN receive notice of the above.  No further needs identified at this time.            07/18/19 1707   Discharge Disposition   Patient preference/choice provided? Yes   Physical Discharge Disposition SNF   Receiving facility, unit and room number: [facility]   Mode of Transportation Ambulance   Pick up time 4:00   Patient/Family/POA notified of transfer plan Yes   Patient agreeable to discharge plan/expected d/c date? Yes   Family/POA agreeable to discharge plan/expected d/c date? Yes   Bedside nurse notified of transport plan? Yes   Outpatient Services   Home Health Skilled Nursing   CM Interventions   Multidisciplinary rounds/family meeting before d/c? Yes   Medicare Checklist   Is this a Medicare patient? No   Patient received 1st IMM Letter? n/a

## 2019-07-18 NOTE — Discharge Instr - Activity (Signed)
As tolerated

## 2019-07-18 NOTE — Discharge Instr - Diet (Signed)
Diabetic diet

## 2019-07-18 NOTE — Progress Notes (Signed)
Nutritional Support Services  Nutrition Note    [name] 64 y.o. female   MRN: [id number]      Reason for Referral: Low risk F/u    Diet: Renal 80g protein    Education Provided: Pt denies education    Clinical Nutrition Summary: Pt seen as follow up. Sitting up in bed AAOx4. Minimally interested in participating in interview at time of assessment. Reports "fine" appetite. Overall continues to consume 75-100% of meals- a few meals of 25% due to arm pain, intake improved again. No reports of N/V/D/C. No additional issues reported at this time. D/c planning       No nutrition diagnosis at this time.      Patient is currently at low nutritional risk; will be available within 10 days and PRN.    Edsel Petrin MS, RDN, [place], [place]  Clinical Dietitian  [phone]

## 2019-07-18 NOTE — Progress Notes (Signed)
3Hrs HD tx. Completed uneventful; Net fluid removed 3L; Rt. CVC worked well w/ lines reversed. BP very soft throughout tx.;c/o feeling tired post HD., For possible transfer to [facility]. Rehab. Today post HD. Report given to Tery Sanfilippo RN.     07/18/19 1214   Treatment Summary   Time Off Machine 1210   Duration of Treatment (Hours) 3.0   Treatment Type 1:1   Dialyzer Clearance Moderately streaked   Fluid Volume Off (mL) 3500   Prime Volume (mL) 200   Rinseback Volume (mL) 200   Fluid Given: Normal Saline (mL) 0   Fluid Given: PRBC  0 mL   Fluid Given: Albumin (mL) 100   Fluid Given: Other (mL) 0   Total Fluid Given 500   Hemodialysis Net Fluid Removed 3000   Post Treatment Assessment   Post-Treatment Weight (Kg) 103.3   Patient Response to Treatment tolerated tx.   Additional Dialyzer Used 0   Vitals   Temp 97.5 F (36.4 C)   Heart Rate 71   Resp Rate 18   BP 114/56   SpO2 95 %   O2 Device None (Room air)   Assessment   Mental Status Alert;Oriented;Cooperative   Cardiac (WDL) WDL   Cardiac Regularity Regular   Cardiac Symptoms None   Cardiac Rhythm Normal Sinus Rhythm   Respiratory  WDL   Respiratory Pattern Regular   Bilateral Breath Sounds Clear;Diminished   R Breath Sounds Clear;Diminished   L Breath Sounds Clear   Edema  WDL   Generalized Edema None   Facial None   Sacral UTA   RLE Edema +1   LLE Edema +1   General Skin Color Appropriate for ethnicity   Skin Condition/Temp Warm;Dry   Gastrointestinal (WDL) WDL   Abdomen Inspection Soft;Nondistended   GI Symptoms None   Mobility Ambulatory with Assistance   Pain Assessment   Charting Type Reassessment   Pain Scale Used Numeric Scale (0-10)   Numeric Pain Scale   Pain Score 6   POSS Score 1   Pain Location Arm   Pain Orientation Right   Pain Descriptors Aching   Pain Intervention(s) Repositioned;Extremity elevation   Education   Person taught Patient   Knowledge basis Minimal   Topics taught Access care;S&S of infection   Teaching Tools Explain   [name] Understanding   Bedside Nurse Communication   Name of bedside RN - post dialysis Tery Sanfilippo

## 2019-07-18 NOTE — Progress Notes (Signed)
[facility] PROGRESS NOTE  Aaron Edelman, x [phone] [facility] Spectralink)      Date Time: 07/18/19 9:33 AM  Patient Name: [name]  Attending Physician: Forest Becker, MD    CC: follow-up CKD  HD note  Assessment:     1. ESRD- on HD at [facility]  2. Volume overload-much better  3. HTN: Controlled  4. Anemia-  Low but stable  5. Secondary HPT  6. Abdominal surgical wound- not healing, MRSA and Pseudomonas  7. Recurrent falls      Recommendations:     1. Unable to compete HD yesterday due to catheter issues; s/p cathflo overnight; presently being dialyzed, aiming for 3L net ultrafiltration  2. If able to complete treatment today, plan for next HD on Monday; will change her inpatient HD schedule to MWF to match OP schedule  3. Check iron stores  4. Stable from renal perspective    Dialysis Treatment Data:  Blood flow rate: 250 mL/min  Dialysate flow rate: 700 mL/min  Ultrafiltration rate: 1170 mL/hr    Dialysate potassium: 2 mEq/L  Dialysate calcium: 2.5 mEq/L    Arterial pressure: -100 mmHg  Venous pressure: 3mmHg      Case discussed with: pt, HD RN    Verl Blalock, MD  [facility]  [phone] (office)  X [phone] (FFX Spectra-Link)      Subjective: seen on hemodialysis, tolerating treatment well, hemodynamically stable; continues to report pain with breathing    Review of Systems:   Cardio: no chest pain, trace edema  Pulm: no SOB  GI: no nausea, vomiting or diarrhea    Physical Exam:     Vitals:    07/18/19 0611 07/18/19 0715 07/18/19 0905 07/18/19 0915   BP: 115/73 117/76 105/56 100/53   Pulse: 70 73 70 71   Resp:  15 16 16    Temp:  98.2 F (36.8 C) 98.4 F (36.9 C)    TempSrc:  Oral     SpO2:  92% 96% 96%   Weight:       Height:           Intake and Output Summary (Last 24 hours) at Date Time    Intake/Output Summary (Last 24 hours) at 07/18/2019 0933  Last data filed at 07/18/2019 0600  Gross per 24 hour   Intake 1050 ml   Output 1950 ml   Net -900 ml     General:  awake, alert, oriented x 3, no acute distress.  Cardiovascular: regular rate and rhythm  Lungs: clear to auscultation bilaterally, bilateral air entry, normal work of breathing  Abdomen: soft; LLQ wound bandaged  Extremities: trace edema  Other: RIJ Permcath    Meds:      Scheduled Meds: PRN Meds:    amLODIPine, 5 mg, Oral, Daily  atorvastatin, 40 mg, Oral, Daily  carvedilol, 25 mg, Oral, Q12H  collagenase, , Topical, Q24H  darbepoetin alfa, 0.45 mcg/kg, Subcutaneous, Weekly  gabapentin, 300 mg, Oral, Daily  heparin (porcine), 5,000 Units, Subcutaneous, Q8H SCH  insulin glargine, 30 Units, Subcutaneous, QAM  insulin lispro, 1-3 Units, Subcutaneous, QHS  insulin lispro, 1-5 Units, Subcutaneous, TID AC  miconazole 2 % with zinc oxide, , Topical, Q6H  nystatin, 1 application, Topical, BID  pantoprazole, 40 mg, Oral, BID  sevelamer, 800 mg, Oral, TID MEALS  sodium hypochlorite, , Irrigation, Daily  vitamin C, 500 mg, Oral, Daily  zinc sulfate, 220 mg, Oral, Daily  Continuous Infusions:   sodium chloride, , PRN  acetaminophen, 650 mg, Q6H PRN  albumin human, 100 mL, PRN  dextrose, 15 g of glucose, PRN   And  dextrose, 12.5 g, PRN   And  glucagon (rDNA), 1 mg, PRN  HYDROmorphone, 1 mg, Q4H PRN  melatonin, 3 mg, QHS PRN  naloxone, 0.2 mg, PRN  ondansetron, 4 mg, Q6H PRN   Or  ondansetron, 4 mg, Q6H PRN  oxyCODONE, 5 mg, Q6H PRN  sodium chloride, 100 mL, Q1H PRN  sodium chloride, 250 mL, PRN              Labs:     Recent Labs   Lab 07/18/19  0335 07/17/19  0420 07/16/19  0434   WBC 10.71* 11.03* 13.00*   Hgb 7.5* 8.0* 7.6*   Hematocrit 25.6* 26.7* 25.2*   Platelets 144 157 147     Recent Labs   Lab 07/18/19  0335 07/17/19  0420 07/16/19  0434   Sodium 136 134* 135*   Potassium 5.3* 5.3* 4.7   Chloride 98* 96* 97*   CO2 23 23 26    BUN 73.0* 77.0* 53.0*   Creatinine 7.3* 7.3* 5.6*   Calcium 8.8 8.4* 9.0   Albumin 2.5* 2.6* 2.7*   Phosphorus 5.5* 5.2* 4.7   Magnesium 2.1 2.0 1.9   Glucose 141* 229* 239*   EGFR 6.8 6.8  9.2             Signed by: Verl Blalock, MD

## 2019-07-18 NOTE — Discharge Summary (Signed)
Discharge Summary    Date:07/18/2019   Patient Name: [name]  Attending Physician: Forest Becker, MD    Date of Admission:   06/30/2019    Date of Discharge:   07/18/2019    Admitting Diagnosis:   ESRD on HD with malfunctioning tunneled dialysis catheter s/p exchange 6/10  Abdominal wound infection/cellulitis from prior history of peritoneal dialysis catheter due to MRSA and Pseudomonas s/p completion of IV antibiotics  Mechanical fall  Transient alteration of mental status, resolved  Anemia of chronic kidney disease s/p 1 unit PRBC transfusion  DM2  Hypertension  Hyperlipidemia  Neuropathy, NOS  Tinea intertrigo s/p miconazole  Arm pain    Discharge Dx:     Principal Diagnosis (Diagnosis after study, that is chiefly responsible for admission to inpatient status): Dizziness    ESRD on HD with malfunctioning tunneled dialysis catheter s/p exchange 6/10  Abdominal wound infection/cellulitis from prior history of peritoneal dialysis catheter due to MRSA and Pseudomonas s/p completion of IV antibiotics  Mechanical fall  Transient alteration of mental status, resolved  Anemia of chronic kidney disease s/p 1 unit PRBC transfusion  DM2  Hypertension  Hyperlipidemia  Neuropathy, NOS  Tinea intertrigo s/p miconazole  Arm pain    Treatment Team:   Treatment Team:   Attending Provider: Forest Becker, MD  Consulting Physician: Renard Hamper, MD  Consulting Physician: Armando Reichert, MD  Consulting Physician: Ernst Breach, MD  Consulting Physician: Renelda Loma, MD     Procedures performed:   Radiology: all results from this admission  XR Humerus Right AP Lateral    Result Date: 06/25/2019    Unremarkable right humerus. Alba Destine, MD  06/25/2019 7:26 PM    Wrist Right PA Lateral And Oblique    Result Date: 06/30/2019   4 views of the right wrist were compared to a similar study from 06/25/2019. No new fractures seen. No malalignment. Soft tissue swelling present. Atherosclerosis. Lorretta Harp,  DO  06/30/2019 11:28 PM    XR Wrist Right PA Lateral And Oblique    Result Date: 06/25/2019   Triquetral fracture. Alba Destine, MD  06/25/2019 7:28 PM    CT Head WO Contrast    Result Date: 07/01/2019  Stable left posterior parietal soft tissue swelling with no acute intracranial process. Delaney Meigs, MD  07/01/2019 11:48 PM    CT Head without Contrast    Result Date: 06/30/2019   No acute intracranial findings demonstrated. No acute skull fracture. Posterior scalp hematoma. Lorretta Harp, DO  06/30/2019 11:43 PM    CT Head WO Contrast    Result Date: 06/23/2019   No intra-axial mass lesion or hemorrhage is demonstrated. Steward Drone, MD  06/23/2019 1:26 PM    CT Cervical Spine without Contrast    Result Date: 06/30/2019  No evidence of acute cervical spine fracture or malalignment. Gillermina Phy, MD  06/30/2019 11:56 PM    MRI Brain WO Contrast    Result Date: 06/23/2019  No evidence of acute infarction, intracranial hemorrhage, mass effect or hydrocephalus. Gillermina Phy, MD  06/23/2019 8:04 PM    XR Chest AP Portable    Result Date: 07/02/2019  No acute process. Delaney Meigs, MD  07/02/2019 12:36 AM    US Venous Up Extrem Duplex Dopp Uni Right    Result Date: 06/24/2019   Focal superficial vein thrombosis of the right cephalic vein in the antecubital fossa. No DVT. Graciella Freer, MD  06/24/2019 2:03 PM  Tunneled Cath Check/Change (Permcath)    Result Date: 07/07/2019   Successful right internal jugular tunneled dialysis catheter exchange Nelma Rothman, MD  07/03/2019 2:21 PM      Reason for Admission:   ESRD on HD with malfunctioning tunneled dialysis catheter s/p exchange 6/10  Abdominal wound infection/cellulitis from prior history of peritoneal dialysis catheter due to MRSA and Pseudomonas s/p completion of IV antibiotics  Mechanical fall  Transient alteration of mental status, resolved  Anemia of chronic kidney disease s/p 1 unit PRBC transfusion  DM2  Hypertension  Hyperlipidemia  Neuropathy, NOS  Tinea intertrigo s/p miconazole  Arm  pain  Hospital Course:     64 year old female initially admitted for mechanical falls found to have malfunctioning tunneled dialysis catheter had exchange on 6/10.  Further had episode of fever and found to have abdominal wound infection/cellulitis in the abdomen from prior history of peritoneal dialysis catheter.  Culture grew out MRSA and Pseudomonas.  Completed a course of IV antibiotics while hospitalized.  No further antibiotics required outside the hospital.  Patient continued to complain of right arm pain from her mechanical falls.  X-ray showed no fracture.  Patient to continue pain control, conservative management of right arm pain, continue dialysis as per nephrology.  Discharged in stable condition.    Condition at Discharge:   Stable  Today:     BP 94/53    Pulse 71    Temp 98.4 F (36.9 C)    Resp 16    Ht 1.575 m (5\' 2" )    Wt 107.6 kg (237 lb 3.4 oz)    LMP  (LMP Unknown) Comment: post menopausal   SpO2 96%    BMI 43.39 kg/m   Ranges for the last 24 hours:  Temp:  [98.1 F (36.7 C)-99.5 F (37.5 C)] 98.4 F (36.9 C)  Heart Rate:  [67-79] 71  Resp Rate:  [14-20] 16  BP: (94-124)/(48-76) 94/53    Last set of labs       Micro / Labs / Path pending:     Unresulted Labs     None          Discharge Instructions For Providers     1. Follow-up arm pain             Discharge Instructions:     Follow-up Information     Pcp, None, MD. Schedule an appointment as soon as possible for a visit in 1 month(s).           [place]. Schedule an appointment as soon as possible for a visit.    Specialty: Wound Care  Why: Call [phone] going to wound center to help heal wound and prevent further infection, Dr Joan Flores will be available if any proceedure is needed to help close the wound after it has filled in. Any questions call Lovey Newcomer [phone]  Contact information:  [address]  [place]  [phone]  Additional information:  From [address]:   Take the [address] [facility] exit, [address].  Turn slight right onto [address].  Turn right onto [address]   Turn left [address]  Take the 3rd left onto [address].  Take the 3rd right onto [address].  You will arrive at [place] on the right.           Ernst Breach, MD. Schedule an appointment as soon as  possible for a visit in 1 week(s).    Specialties: Infectious Disease, Internal Medicine  Contact information:  [place] [address]  [phone]             Renelda Loma, MD. Schedule an appointment as soon as possible for a visit in 1 week(s).    Specialties: Plastic Surgery, Otorhinolaryngology  Contact information:  [address]  [place] [address]  [phone]             Primary Nephrologist. Schedule an appointment as soon as possible for a visit in 1 week(s).                 Discharge Diet: Renal  Wound 05/27/19 Surgical Incision Abdomen Other (Comment) 4x4, abd, tape  (Active)   Site Description White 07/17/19 2200   Peri-wound Description Clean;Intact 07/17/19 2200   Drainage Amount None 07/16/19 2200   Drainage Description Serosanguinous 07/17/19 2200   Treatments Cleansed;Site care 07/17/19 2200   Dressing Gauze;Silicone Adhesive Foam 07/17/19 2200   Dressing Changed Changed 07/16/19 0920   Dressing Status New drainage 07/17/19 2200   Number of days: 51       Wound 07/06/19 Thigh Right [place], hard, and a little open area (Active)   Site Description Pink 07/17/19 2200   Peri-wound Description Pink 07/17/19 2200   Drainage Amount None 07/16/19 2200   Drainage Description Serosanguinous 07/13/19 2200   Treatments Cleansed;Site care 07/17/19 2200   Dressing Open to air 07/17/19 2200   Dressing Changed Changed 07/17/19 2200   Dressing Status Clean;Dry 07/17/19 2200   Number of days: 12       Wound 07/08/19 Moisture Associated Skin Damage (MASD) Perineum (Active)   Site Description  Pink;Clean 07/17/19 2200   Peri-wound Description Pink 07/17/19 2200   Drainage Amount None 07/16/19 2200   Treatments Cleansed;Site care 07/17/19 2200   Dressing Open to air 07/17/19 2200   Dressing Changed Changed 07/16/19 0920   Dressing Status Clean 07/17/19 2200   Number of days: 10          Disposition:  Home or Self Care     Discharge Medication List      Taking    amLODIPine 5 MG tablet  Dose: 5 mg  Commonly known as: NORVASC  Take 1 tablet (5 mg total) by mouth every 12 (twelve) hours     atorvastatin 40 MG tablet  Dose: 40 mg  Commonly known as: LIPITOR  Take 40 mg by mouth daily     carvedilol 25 MG tablet  Dose: 25 mg  Commonly known as: COREG  Take 1 tablet (25 mg total) by mouth every 12 (twelve) hours     ciprofloxacin 250 MG tablet  Dose: 250 mg  Commonly known as: CIPRO  Take 1 tablet (250 mg total) by mouth daily for 5 days     collagenase ointment  Commonly known as: SANTYL  Apply topically every 24 hours     famotidine 40 MG tablet  Dose: 40 mg  Commonly known as: PEPCID  Take 40 mg by mouth daily     gabapentin 300 MG capsule  Dose: 300 mg  Commonly known as: NEURONTIN  Take 1 capsule (300 mg total) by mouth daily  Replaces: gabapentin 600 MG tablet     insulin aspart 100 UNIT/ML injection  Dose: 5 Units  What changed: how much to take  Commonly known as: NovoLOG  Inject 5  Units into the skin 3 (three) times daily before meals     insulin glargine 100 UNIT/ML injection  Dose: 30 Units  What changed:    how much to take   when to take this  Commonly known as: LANTUS  Inject 30 Units into the skin every 12 (twelve) hours     lactobacillus/streptococcus Caps  Dose: 1 capsule  Take 1 capsule by mouth daily     lidocaine 5 %  Dose: 1 patch  Commonly known as: LIDODERM  Place 1 patch onto the skin every 24 hours Remove & Discard patch within 12 hours or as directed by MD     nystatin powder  Dose: 1 application  Commonly known as: NYSTOP  Apply 1 application topically 2 (two) times daily      oxyCODONE 5 MG immediate release tablet  Dose: 5 mg  Commonly known as: ROXICODONE  Take 1 tablet (5 mg total) by mouth every 6 (six) hours as needed for Pain     pantoprazole 40 MG tablet  Dose: 40 mg  Commonly known as: PROTONIX  Take 1 tablet (40 mg total) by mouth 2 (two) times daily     sevelamer 800 MG tablet  Dose: 800 mg  Commonly known as: RENVELA  Take 800 mg by mouth 3 (three) times daily with meals     sodium hypochlorite 0.125 % Soln  Commonly known as: DAKIN'S QUARTER STRENGTH  Irrigate with as directed daily     Trulicity 0.07 HQ/1.9XJ Sopn  Dose: 0.75 mg  Generic drug: Dulaglutide  Inject 0.75 mg into the skin once a week     vitamin C 500 MG tablet  Dose: 500 mg  Commonly known as: ASCORBIC ACID  Take 1 tablet (500 mg total) by mouth daily     zinc Oxide 40 % Pste paste  Commonly known as: DESITIN  Apply topically every 6 (six) hours     zinc sulfate 220 (50 Zn) MG capsule  Dose: 220 mg  Commonly known as: ZINCATE  Take 1 capsule (220 mg total) by mouth daily        STOP taking these medications    gabapentin 600 MG tablet  Commonly known as: NEURONTIN  Replaced by: gabapentin 300 MG capsule          Minutes spent coordinating discharge and reviewing discharge plan: 35 minutes      Signed by: Forest Becker, MD

## 2019-07-18 NOTE — Progress Notes (Signed)
Pt. A/ox3; feels a bit better than yesterday as stated. No SOB; Rt. Arm pain remain unresolved; Rt. CVC - art. port still w/ resistance when aspirated.Ven. port - w/ smooth flow; Lines flushed well; Prim RN concerned about her H/H- addressed to Nephrologist. No recommendation for BT. BP soft. Will monitor pt's. Progress with HD.   07/18/19 0850   Bedside Nurse Communication   Name of bedside RN - pre dialysis Duard Brady   Treatment Initiation- With Dialysis Precautions   Time Out/Safety Check Completed Yes   Consent for HD signed for this hospitalization (Date) 05/23/19   Consent for HD signed for this hospitalization (Time) 1044   Preferred language No   Blood Consent Verified N/A   Dialysis Precautions All Connections Secured   Dialysis Treatment Type Routine;Bedside   Special Considerations Contact Isolation   Is patient diabetic? Yes   RO/Hemodialysis Architectural technologist   Is Total Chlorine less than 0.1 ppm? Yes   Orignial Total Chlorine Testing Time 0845   At 4 Hour Total Chlorine Testing Time 1245   RO/Hemodialysis Facilities manager Number 3    Machine Serial Number   [id number])   RO # 21   RO Serial # [id number]   Water Hardness Yes   pH 7   Pressure Test Verified Yes   Alarms Verified Passed   Machine Temperature 97.7 F (36.5 C)   Alarms Verified Yes   Na+ mEq (Machine) 138 mEq   Bicarb mEq (Machine) 35 mEq   Hemodialysis Conductivity (Machine) 14.1   Hemodialysis Conductivity (Meter) 14   Dialyzer Lot Number [id number]   Tubing Lot Number [id number]   RO Machine Log Completed Yes   Hepatitis Status   HBsAg (Antigen) Result Negative   HBsAg Date Drawn 07/10/19   HBsAg Repeat Draw Due Date 08/07/19   Dialysis Weight   Pre-Treatment Weight (Kg) 106.6   Scale Type ICU Bed Scale   Vitals   Temp 98.4 F (36.9 C)   Heart Rate 74   Resp Rate 16   BP 108/53   SpO2 96 %   O2 Device None (Room air)   Assessment   Mental Status Alert;Oriented;Cooperative   Cardiac (WDL) WDL   Cardiac Regularity  Regular   Cardiac Symptoms None   Cardiac Rhythm Normal Sinus Rhythm   Respiratory  WDL   Respiratory Pattern Regular   Bilateral Breath Sounds Clear;Diminished   R Breath Sounds Clear;Diminished   L Breath Sounds Clear   Edema  WDL   Generalized Edema None   Facial None   Sacral UTA   RLE Edema +1   LLE Edema +1   General Skin Color Appropriate for ethnicity   Skin Condition/Temp Warm;Dry   Gastrointestinal (WDL) X   Abdomen Inspection Soft;Nondistended   GI Symptoms None   Mobility Ambulatory with Assistance   Pain Assessment   Charting Type Assessment   Pain Scale Used Numeric Scale (0-10)   Numeric Pain Scale   Pain Score 5   POSS Score 1   Pain Location Arm   Pain Orientation Right   Pain Descriptors Aching   Pain Frequency Intermittent   Effect of Pain on Daily Activities moderate   Patient's Stated Comfort Functional Goal 0   Pain Intervention(s) Extremity elevation   Multiple Pain Sites No   Hemodialysis Comments   Pre-Hemodialysis Comments Timeout safety check- done   Hemodialysis History Information   [facility]   Chronic Dialysis Patient  Yes   Outpatient Nephrologist Dr. Conley Canal

## 2019-07-18 NOTE — Plan of Care (Signed)
Problem: Renal Instability  Goal: Fluid and electrolyte balance are achieved/maintained  Flowsheets (Taken 07/18/2019 1330)  Fluid and electrolyte balance are achieved/maintained:   Monitor intake and output every shift   Monitor/assess lab values and report abnormal values   Monitor daily weight   Provide adequate hydration   Assess and reassess fluid and electrolyte status   Assess for confusion/personality changes   Follow fluid restrictions/IV/PO parameters   Observe for cardiac arrhythmias  Goal: Free from infection  Flowsheets (Taken 07/18/2019 1330)  Free from infection: Monitor/assess for signs and symptoms of infection     Problem: Patient Receiving Advanced Renal Therapies  Goal: Therapy access site remains intact  Flowsheets (Taken 07/18/2019 1330)  Therapy access site remains intact:   Assess therapy access site   Change therapy access site dressing as needed

## 2019-07-18 NOTE — Discharge Instr - AVS First Page (Signed)
Reason for your Hospital Admission:  ***      Instructions for after your discharge:  ***

## 2019-07-18 NOTE — Progress Notes (Signed)
[facility], Report given to RN, condition stable at discharge.

## 2019-07-19 ENCOUNTER — Telehealth: Payer: Self-pay

## 2019-07-19 NOTE — Telephone Encounter (Signed)
Wound Healing Center Navigator Note    Wound nurse from [facility] called navigator found on AVS. No wound instructions given nurse wanted clarification. Navigator read Conservator, museum/gallery notes to nurse indicating Dakins to cleanse wound and santyl as primary treatment. Nurse understood navigator can only provide information no orders as pt has never been seen in [facility].           Nonie Hoyer RN, BSN  [facility] Navigator   [email or web address]  [phone]

## 2019-07-20 ENCOUNTER — Emergency Department: Payer: No Typology Code available for payment source

## 2019-07-20 ENCOUNTER — Inpatient Hospital Stay
Admission: EM | Admit: 2019-07-20 | Discharge: 2019-07-31 | DRG: 182 | Disposition: A | Discharge status: Skilled Nursing Facility | Payer: No Typology Code available for payment source | Attending: Internal Medicine | Admitting: Internal Medicine

## 2019-07-20 DIAGNOSIS — S31109D Unspecified open wound of abdominal wall, unspecified quadrant without penetration into peritoneal cavity, subsequent encounter: Secondary | ICD-10-CM

## 2019-07-20 DIAGNOSIS — L03311 Cellulitis of abdominal wall: Secondary | ICD-10-CM | POA: Diagnosis present

## 2019-07-20 DIAGNOSIS — E1165 Type 2 diabetes mellitus with hyperglycemia: Secondary | ICD-10-CM | POA: Diagnosis present

## 2019-07-20 DIAGNOSIS — B9562 Methicillin resistant Staphylococcus aureus infection as the cause of diseases classified elsewhere: Secondary | ICD-10-CM | POA: Diagnosis present

## 2019-07-20 DIAGNOSIS — K5900 Constipation, unspecified: Secondary | ICD-10-CM | POA: Diagnosis not present

## 2019-07-20 DIAGNOSIS — Z794 Long term (current) use of insulin: Secondary | ICD-10-CM

## 2019-07-20 DIAGNOSIS — Z992 Dependence on renal dialysis: Secondary | ICD-10-CM

## 2019-07-20 DIAGNOSIS — I120 Hypertensive chronic kidney disease with stage 5 chronic kidney disease or end stage renal disease: Secondary | ICD-10-CM | POA: Diagnosis present

## 2019-07-20 DIAGNOSIS — Z96612 Presence of left artificial shoulder joint: Secondary | ICD-10-CM | POA: Diagnosis present

## 2019-07-20 DIAGNOSIS — T426X5A Adverse effect of other antiepileptic and sedative-hypnotic drugs, initial encounter: Secondary | ICD-10-CM | POA: Diagnosis not present

## 2019-07-20 DIAGNOSIS — E872 Acidosis: Secondary | ICD-10-CM | POA: Diagnosis present

## 2019-07-20 DIAGNOSIS — I309 Acute pericarditis, unspecified: Principal | ICD-10-CM | POA: Diagnosis present

## 2019-07-20 DIAGNOSIS — E1122 Type 2 diabetes mellitus with diabetic chronic kidney disease: Secondary | ICD-10-CM | POA: Diagnosis present

## 2019-07-20 DIAGNOSIS — Z8672 Personal history of thrombophlebitis: Secondary | ICD-10-CM

## 2019-07-20 DIAGNOSIS — E785 Hyperlipidemia, unspecified: Secondary | ICD-10-CM | POA: Diagnosis present

## 2019-07-20 DIAGNOSIS — J449 Chronic obstructive pulmonary disease, unspecified: Secondary | ICD-10-CM | POA: Diagnosis present

## 2019-07-20 DIAGNOSIS — E8770 Fluid overload, unspecified: Secondary | ICD-10-CM | POA: Diagnosis present

## 2019-07-20 DIAGNOSIS — Z8249 Family history of ischemic heart disease and other diseases of the circulatory system: Secondary | ICD-10-CM

## 2019-07-20 DIAGNOSIS — D631 Anemia in chronic kidney disease: Secondary | ICD-10-CM | POA: Diagnosis present

## 2019-07-20 DIAGNOSIS — E875 Hyperkalemia: Secondary | ICD-10-CM | POA: Diagnosis present

## 2019-07-20 DIAGNOSIS — Z66 Do not resuscitate: Secondary | ICD-10-CM | POA: Diagnosis present

## 2019-07-20 DIAGNOSIS — E669 Obesity, unspecified: Secondary | ICD-10-CM | POA: Diagnosis present

## 2019-07-20 DIAGNOSIS — Z20822 Contact with and (suspected) exposure to covid-19: Secondary | ICD-10-CM | POA: Diagnosis present

## 2019-07-20 DIAGNOSIS — Z79899 Other long term (current) drug therapy: Secondary | ICD-10-CM

## 2019-07-20 DIAGNOSIS — R079 Chest pain, unspecified: Secondary | ICD-10-CM

## 2019-07-20 DIAGNOSIS — Z833 Family history of diabetes mellitus: Secondary | ICD-10-CM

## 2019-07-20 DIAGNOSIS — R42 Dizziness and giddiness: Secondary | ICD-10-CM

## 2019-07-20 DIAGNOSIS — K219 Gastro-esophageal reflux disease without esophagitis: Secondary | ICD-10-CM | POA: Diagnosis present

## 2019-07-20 DIAGNOSIS — Z96651 Presence of right artificial knee joint: Secondary | ICD-10-CM | POA: Diagnosis present

## 2019-07-20 DIAGNOSIS — I951 Orthostatic hypotension: Secondary | ICD-10-CM | POA: Diagnosis present

## 2019-07-20 DIAGNOSIS — Z6841 Body Mass Index (BMI) 40.0 and over, adult: Secondary | ICD-10-CM

## 2019-07-20 DIAGNOSIS — E861 Hypovolemia: Secondary | ICD-10-CM | POA: Diagnosis present

## 2019-07-20 DIAGNOSIS — J189 Pneumonia, unspecified organism: Secondary | ICD-10-CM | POA: Diagnosis present

## 2019-07-20 DIAGNOSIS — Y712 Prosthetic and other implants, materials and accessory cardiovascular devices associated with adverse incidents: Secondary | ICD-10-CM | POA: Diagnosis present

## 2019-07-20 DIAGNOSIS — T8241XA Breakdown (mechanical) of vascular dialysis catheter, initial encounter: Secondary | ICD-10-CM | POA: Diagnosis present

## 2019-07-20 DIAGNOSIS — M109 Gout, unspecified: Secondary | ICD-10-CM | POA: Diagnosis present

## 2019-07-20 DIAGNOSIS — G92 Toxic encephalopathy: Secondary | ICD-10-CM | POA: Diagnosis not present

## 2019-07-20 DIAGNOSIS — R0602 Shortness of breath: Secondary | ICD-10-CM | POA: Diagnosis present

## 2019-07-20 DIAGNOSIS — N186 End stage renal disease: Secondary | ICD-10-CM | POA: Diagnosis present

## 2019-07-20 DIAGNOSIS — Z8673 Personal history of transient ischemic attack (TIA), and cerebral infarction without residual deficits: Secondary | ICD-10-CM

## 2019-07-20 LAB — COMPREHENSIVE METABOLIC PANEL
ALT: 11 U/L (ref 0–55)
AST (SGOT): 10 U/L (ref 5–34)
Albumin/Globulin Ratio: 0.6 — ABNORMAL LOW (ref 0.9–2.2)
Albumin: 2.8 g/dL — ABNORMAL LOW (ref 3.5–5.0)
Alkaline Phosphatase: 109 U/L — ABNORMAL HIGH (ref 37–106)
Anion Gap: 15 (ref 5.0–15.0)
BUN: 91 mg/dL — ABNORMAL HIGH (ref 7.0–19.0)
Bilirubin, Total: 0.3 mg/dL (ref 0.2–1.2)
CO2: 23 mEq/L (ref 22–29)
Calcium: 8.9 mg/dL (ref 8.5–10.5)
Chloride: 95 mEq/L — ABNORMAL LOW (ref 100–111)
Creatinine: 9.1 mg/dL — ABNORMAL HIGH (ref 0.6–1.0)
Globulin: 4.7 g/dL — ABNORMAL HIGH (ref 2.0–3.6)
Glucose: 212 mg/dL — ABNORMAL HIGH (ref 70–100)
Potassium: 6.2 mEq/L (ref 3.5–5.1)
Protein, Total: 7.5 g/dL (ref 6.0–8.3)
Sodium: 133 mEq/L — ABNORMAL LOW (ref 136–145)

## 2019-07-20 LAB — TROPONIN I: Troponin I: <0.01 ng/mL (ref 0.00–0.05)

## 2019-07-20 LAB — CBC AND DIFFERENTIAL
Absolute NRBC: 0 10*3/uL (ref 0.00–0.00)
Basophils Absolute Automated: 0.02 10*3/uL (ref 0.00–0.08)
Basophils Automated: 0.2 %
Eosinophils Absolute Automated: 0.12 10*3/uL (ref 0.00–0.44)
Eosinophils Automated: 1.2 %
Hematocrit: 25 % — ABNORMAL LOW (ref 34.7–43.7)
Hgb: 7.4 g/dL — ABNORMAL LOW (ref 11.4–14.8)
Immature Granulocytes Absolute: 0.04 10*3/uL (ref 0.00–0.07)
Immature Granulocytes: 0.4 %
Lymphocytes Absolute Automated: 1.46 10*3/uL (ref 0.42–3.22)
Lymphocytes Automated: 14.9 %
MCH: 27 pg (ref 25.1–33.5)
MCHC: 29.6 g/dL — ABNORMAL LOW (ref 31.5–35.8)
MCV: 91.2 fL (ref 78.0–96.0)
MPV: 11.2 fL (ref 8.9–12.5)
Monocytes Absolute Automated: 1.24 10*3/uL — ABNORMAL HIGH (ref 0.21–0.85)
Monocytes: 12.7 %
Neutrophils Absolute: 6.89 10*3/uL — ABNORMAL HIGH (ref 1.10–6.33)
Neutrophils: 70.6 %
Nucleated RBC: 0 /100 WBC (ref 0.0–0.0)
Platelets: 171 10*3/uL (ref 142–346)
RBC: 2.74 10*6/uL — ABNORMAL LOW (ref 3.90–5.10)
RDW: 14 % (ref 11–15)
WBC: 9.77 10*3/uL — ABNORMAL HIGH (ref 3.10–9.50)

## 2019-07-20 LAB — GFR: EGFR: 5.3

## 2019-07-20 LAB — HEMOLYSIS INDEX: Hemolysis Index: -1 — ABNORMAL LOW (ref 0–18)

## 2019-07-20 LAB — B-TYPE NATRIURETIC PEPTIDE: B-Natriuretic Peptide: 277 pg/mL — ABNORMAL HIGH (ref 0–100)

## 2019-07-20 MED ORDER — TECHNETIUM TC 99M ALBUMIN AGGREGATED
4.90 | Freq: Once | Status: AC | PRN
Start: 2019-07-20 — End: 2019-07-20
  Administered 2019-07-20: 5 via INTRAVENOUS

## 2019-07-20 NOTE — ED Notes (Signed)
XR at bedside

## 2019-07-20 NOTE — ED Notes (Signed)
First encounter with patient, NAD, respirations even and unlabored, denies pain, denies needs at this time.

## 2019-07-20 NOTE — ED Notes (Signed)
Bed: PU31  Expected date: 07/20/19  Expected time: 6:39 PM  Means of arrival: [id number]- [place]  Comments:  Medic 209

## 2019-07-20 NOTE — ED Triage Notes (Signed)
[name] is a 63 y.o. woman BIBA d/t generalized body aches. Pt is currently residing at nursing home and reports not receiving attention needed. Pt c/o stomach pains, CP, SOB, and generalized body aches x2 days.     Pt has hx of ESRF and utilizes dialysis.       Pt denies N/V at this time.

## 2019-07-21 ENCOUNTER — Inpatient Hospital Stay: Payer: No Typology Code available for payment source

## 2019-07-21 ENCOUNTER — Encounter: Payer: Self-pay | Admitting: Internal Medicine

## 2019-07-21 ENCOUNTER — Observation Stay: Payer: No Typology Code available for payment source

## 2019-07-21 DIAGNOSIS — R0602 Shortness of breath: Secondary | ICD-10-CM | POA: Diagnosis present

## 2019-07-21 LAB — HEMOLYSIS INDEX
Hemolysis Index: 1 (ref 0–18)
Hemolysis Index: 6 (ref 0–18)
Hemolysis Index: 47 — ABNORMAL HIGH (ref 0–18)

## 2019-07-21 LAB — ECG 12-LEAD
Atrial Rate: 77 {beats}/min
Atrial Rate: 71 {beats}/min
P Axis: 49 degrees
P Axis: 43 degrees
P-R Interval: 138 ms
P-R Interval: 138 ms
Q-T Interval: 386 ms
Q-T Interval: 388 ms
QRS Duration: 66 ms
QRS Duration: 68 ms
QTC Calculation (Bezet): 436 ms
QTC Calculation (Bezet): 421 ms
R Axis: 45 degrees
R Axis: 45 degrees
T Axis: 50 degrees
T Axis: 47 degrees
Ventricular Rate: 77 {beats}/min
Ventricular Rate: 71 {beats}/min

## 2019-07-21 LAB — BASIC METABOLIC PANEL
Anion Gap: 20 — ABNORMAL HIGH (ref 5.0–15.0)
Anion Gap: 18 — ABNORMAL HIGH (ref 5.0–15.0)
Anion Gap: 20 — ABNORMAL HIGH (ref 5.0–15.0)
BUN: 100 mg/dL — ABNORMAL HIGH (ref 7.0–19.0)
BUN: 91 mg/dL — ABNORMAL HIGH (ref 7.0–19.0)
BUN: 94 mg/dL — ABNORMAL HIGH (ref 7.0–19.0)
CO2: 18 mEq/L — ABNORMAL LOW (ref 22–29)
CO2: 21 mEq/L — ABNORMAL LOW (ref 22–29)
CO2: 20 mEq/L — ABNORMAL LOW (ref 22–29)
Calcium: 9.4 mg/dL (ref 8.5–10.5)
Calcium: 8.4 mg/dL — ABNORMAL LOW (ref 8.5–10.5)
Calcium: 8.5 mg/dL (ref 8.5–10.5)
Chloride: 94 mEq/L — ABNORMAL LOW (ref 100–111)
Chloride: 97 mEq/L — ABNORMAL LOW (ref 100–111)
Chloride: 97 mEq/L — ABNORMAL LOW (ref 100–111)
Creatinine: 9.9 mg/dL — ABNORMAL HIGH (ref 0.6–1.0)
Creatinine: 9.2 mg/dL — ABNORMAL HIGH (ref 0.6–1.0)
Creatinine: 9.4 mg/dL — ABNORMAL HIGH (ref 0.6–1.0)
Glucose: 210 mg/dL — ABNORMAL HIGH (ref 70–100)
Glucose: 213 mg/dL — ABNORMAL HIGH (ref 70–100)
Glucose: 164 mg/dL — ABNORMAL HIGH (ref 70–100)
Potassium: 6.4 mEq/L (ref 3.5–5.1)
Potassium: 4.9 mEq/L (ref 3.5–5.1)
Potassium: 5 mEq/L (ref 3.5–5.1)
Sodium: 132 mEq/L — ABNORMAL LOW (ref 136–145)
Sodium: 136 mEq/L (ref 136–145)
Sodium: 137 mEq/L (ref 136–145)

## 2019-07-21 LAB — GLUCOSE WHOLE BLOOD - POCT
Whole Blood Glucose POCT: 176 mg/dL — ABNORMAL HIGH (ref 70–100)
Whole Blood Glucose POCT: 204 mg/dL — ABNORMAL HIGH (ref 70–100)
Whole Blood Glucose POCT: 235 mg/dL — ABNORMAL HIGH (ref 70–100)
Whole Blood Glucose POCT: 181 mg/dL — ABNORMAL HIGH (ref 70–100)
Whole Blood Glucose POCT: 161 mg/dL — ABNORMAL HIGH (ref 70–100)

## 2019-07-21 LAB — CBC AND DIFFERENTIAL
Absolute NRBC: 0 10*3/uL (ref 0.00–0.00)
Basophils Absolute Automated: 0.03 10*3/uL (ref 0.00–0.08)
Basophils Automated: 0.3 %
Eosinophils Absolute Automated: 0.15 10*3/uL (ref 0.00–0.44)
Eosinophils Automated: 1.4 %
Hematocrit: 25.5 % — ABNORMAL LOW (ref 34.7–43.7)
Hgb: 7.6 g/dL — ABNORMAL LOW (ref 11.4–14.8)
Immature Granulocytes Absolute: 0.06 10*3/uL (ref 0.00–0.07)
Immature Granulocytes: 0.6 %
Lymphocytes Absolute Automated: 1.41 10*3/uL (ref 0.42–3.22)
Lymphocytes Automated: 13.1 %
MCH: 27.2 pg (ref 25.1–33.5)
MCHC: 29.8 g/dL — ABNORMAL LOW (ref 31.5–35.8)
MCV: 91.4 fL (ref 78.0–96.0)
MPV: 10.6 fL (ref 8.9–12.5)
Monocytes Absolute Automated: 1.24 10*3/uL — ABNORMAL HIGH (ref 0.21–0.85)
Monocytes: 11.5 %
Neutrophils Absolute: 7.9 10*3/uL — ABNORMAL HIGH (ref 1.10–6.33)
Neutrophils: 73.1 %
Nucleated RBC: 0 /100 WBC (ref 0.0–0.0)
Platelets: 175 10*3/uL (ref 142–346)
RBC: 2.79 10*6/uL — ABNORMAL LOW (ref 3.90–5.10)
RDW: 14 % (ref 11–15)
WBC: 10.79 10*3/uL — ABNORMAL HIGH (ref 3.10–9.50)

## 2019-07-21 LAB — ECHOCARDIOGRAM ADULT COMP W CLR/ DOPP WV INC BUBBLE STDY
AV Mean Gradient: 4.3
AV Mean Gradient: 4.323
AV Mean Gradient: 4.795
AV Peak Velocity: 138.762
AV Peak Velocity: 139.739
AV Peak Velocity: 145.603
Ao Root Diameter (2D): 3.288
IVS Diastolic Thickness (2D): 1.174
LA Dimension (2D): 3.657
LVID diastole (2D): 3.495
LVID systole (2D): 2.148
MV Area (PHT): 3.16
MV E/A: 1.455
MV E/A: 1.552
MV E/A: 1.611
MV E/A: 1.611
MV E/A: 1.67
MV E/e' (Average): 31.908
Mitral Valve Findings: NORMAL
Pulmonary Valve Findings: NORMAL
TAPSE: 1.609
Tricuspid Valve Findings: NORMAL
Visually Estimated Ejection Fraction: 70

## 2019-07-21 LAB — GFR
EGFR: 4.8
EGFR: 5.2
EGFR: 5.1

## 2019-07-21 LAB — TROPONIN I
Troponin I: <0.01 ng/mL (ref 0.00–0.05)
Troponin I: 0.01 ng/mL (ref 0.00–0.05)

## 2019-07-21 LAB — COVID-19 (SARS-COV-2): SARS CoV 2 Overall Result: NEGATIVE

## 2019-07-21 MED ORDER — ALBUMIN HUMAN 5 % IV SOLN
25.00 g | Freq: Once | INTRAVENOUS | Status: AC
Start: 2019-07-21 — End: 2019-07-21
  Administered 2019-07-21: 11:00:00 25 g via INTRAVENOUS
  Filled 2019-07-21 (×2): qty 500

## 2019-07-21 MED ORDER — ONDANSETRON 4 MG PO TBDP
4.00 mg | ORAL_TABLET | Freq: Four times a day (QID) | ORAL | Status: DC | PRN
Start: 2019-07-21 — End: 2019-07-31

## 2019-07-21 MED ORDER — SEVELAMER CARBONATE 800 MG PO TABS
800.0000 mg | ORAL_TABLET | Freq: Three times a day (TID) | ORAL | Status: DC
Start: 2019-07-21 — End: 2019-07-31
  Administered 2019-07-22 – 2019-07-31 (×28): 800 mg via ORAL
  Filled 2019-07-21 (×33): qty 1

## 2019-07-21 MED ORDER — SODIUM PHOSPHATES 3 MMOLE/ML IV SOLN (WRAP)
20.0000 mmol | INTRAVENOUS | Status: DC | PRN
Start: 2019-07-21 — End: 2019-07-23

## 2019-07-21 MED ORDER — SODIUM CHLORIDE 0.9 % IV MBP
500.00 mg | Freq: Three times a day (TID) | INTRAVENOUS | Status: DC
Start: 2019-07-21 — End: 2019-07-24
  Administered 2019-07-21 – 2019-07-24 (×8): 500 mg via INTRAVENOUS
  Filled 2019-07-21 (×10): qty 0.5

## 2019-07-21 MED ORDER — SODIUM POLYSTYRENE SULFONATE 15 GM/60ML PO SUSP
15.00 g | Freq: Once | ORAL | Status: AC
Start: 2019-07-21 — End: 2019-07-21
  Administered 2019-07-21: 01:00:00 15 g via ORAL
  Filled 2019-07-21: qty 60

## 2019-07-21 MED ORDER — SODIUM BICARBONATE 8.4 % IV SOLN
50.00 meq | Freq: Once | INTRAVENOUS | Status: AC
Start: 2019-07-21 — End: 2019-07-21
  Administered 2019-07-21: 10:00:00 50 meq via INTRAVENOUS

## 2019-07-21 MED ORDER — VANCOMYCIN 1000 MG IN 250 ML NS IVPB VIAL-MATE (CNR)
1000.00 mg | Freq: Once | INTRAVENOUS | Status: DC
Start: 2019-07-21 — End: 2019-07-21

## 2019-07-21 MED ORDER — SODIUM CHLORIDE 0.9 % IV MBP
500.00 mg | INTRAVENOUS | Status: DC
Start: 2019-07-22 — End: 2019-07-21

## 2019-07-21 MED ORDER — INSULIN LISPRO 100 UNIT/ML SC SOLN
2.00 [IU] | Freq: Three times a day (TID) | SUBCUTANEOUS | Status: DC
Start: 2019-07-21 — End: 2019-07-31
  Administered 2019-07-23: 09:00:00 2 [IU] via SUBCUTANEOUS
  Administered 2019-07-23: 13:00:00 4 [IU] via SUBCUTANEOUS
  Administered 2019-07-23: 16:00:00 8 [IU] via SUBCUTANEOUS
  Administered 2019-07-24 – 2019-07-27 (×4): 4 [IU] via SUBCUTANEOUS
  Administered 2019-07-27: 18:00:00 6 [IU] via SUBCUTANEOUS
  Administered 2019-07-28: 09:00:00 2 [IU] via SUBCUTANEOUS
  Administered 2019-07-28 – 2019-07-30 (×3): 4 [IU] via SUBCUTANEOUS
  Administered 2019-07-30 – 2019-07-31 (×2): 2 [IU] via SUBCUTANEOUS
  Filled 2019-07-21: qty 3
  Filled 2019-07-21: qty 12
  Filled 2019-07-21: qty 18
  Filled 2019-07-21: qty 12
  Filled 2019-07-21: qty 3
  Filled 2019-07-21: qty 12
  Filled 2019-07-21: qty 6
  Filled 2019-07-21 (×3): qty 12
  Filled 2019-07-21: qty 3
  Filled 2019-07-21: qty 6
  Filled 2019-07-21 (×2): qty 12

## 2019-07-21 MED ORDER — HEPARIN SODIUM (PORCINE) 5000 UNIT/ML IJ SOLN
5000.00 [IU] | Freq: Two times a day (BID) | INTRAMUSCULAR | Status: DC
Start: 2019-07-21 — End: 2019-07-21
  Administered 2019-07-21: 09:00:00 5000 [IU] via SUBCUTANEOUS
  Filled 2019-07-21: qty 1

## 2019-07-21 MED ORDER — ALBUMIN HUMAN 5 % IV SOLN
25.00 g | Freq: Once | INTRAVENOUS | Status: AC
Start: 2019-07-21 — End: 2019-07-21
  Administered 2019-07-21: 13:00:00 25 g via INTRAVENOUS
  Filled 2019-07-21: qty 500

## 2019-07-21 MED ORDER — GLUCAGON 1 MG IJ SOLR (WRAP)
1.00 mg | INTRAMUSCULAR | Status: DC | PRN
Start: 2019-07-21 — End: 2019-07-21

## 2019-07-21 MED ORDER — OXYCODONE HCL 5 MG PO TABS
10.0000 mg | ORAL_TABLET | ORAL | Status: DC | PRN
Start: 2019-07-21 — End: 2019-07-31
  Administered 2019-07-21 – 2019-07-31 (×9): 10 mg via ORAL
  Filled 2019-07-21 (×11): qty 2

## 2019-07-21 MED ORDER — FAMOTIDINE 20 MG PO TABS
40.0000 mg | ORAL_TABLET | Freq: Every day | ORAL | Status: DC
Start: 2019-07-21 — End: 2019-07-21

## 2019-07-21 MED ORDER — ONDANSETRON HCL 4 MG/2ML IJ SOLN
4.00 mg | Freq: Four times a day (QID) | INTRAMUSCULAR | Status: DC | PRN
Start: 2019-07-21 — End: 2019-07-31

## 2019-07-21 MED ORDER — ALBUMIN HUMAN 25 % IV SOLN
100.0000 mL | INTRAVENOUS | Status: AC | PRN
Start: 2019-07-21 — End: 2019-07-21
  Administered 2019-07-21 (×2): 100 mL via INTRAVENOUS
  Filled 2019-07-21 (×2): qty 100

## 2019-07-21 MED ORDER — CALCIUM GLUCONATE-NACL 1-0.675 GM/50ML-% IV SOLN
1.00 g | Freq: Once | INTRAVENOUS | Status: AC
Start: 2019-07-21 — End: 2019-07-21
  Administered 2019-07-21: 10:00:00 1 g via INTRAVENOUS
  Filled 2019-07-21: qty 50

## 2019-07-21 MED ORDER — MELATONIN 3 MG PO TABS
3.0000 mg | ORAL_TABLET | Freq: Every evening | ORAL | Status: DC | PRN
Start: 2019-07-21 — End: 2019-07-31
  Administered 2019-07-21: 23:00:00 3 mg via ORAL
  Filled 2019-07-21: qty 1

## 2019-07-21 MED ORDER — POTASSIUM CHLORIDE 20 MEQ/50ML IV SOLN
20.0000 meq | INTRAVENOUS | Status: DC | PRN
Start: 2019-07-21 — End: 2019-07-23

## 2019-07-21 MED ORDER — LIDOCAINE 5 % EX PTCH
1.00 | MEDICATED_PATCH | CUTANEOUS | Status: DC
Start: 2019-07-21 — End: 2019-07-31
  Administered 2019-07-22 – 2019-07-31 (×10): 1 via TRANSDERMAL
  Filled 2019-07-21 (×10): qty 1

## 2019-07-21 MED ORDER — VANCOMYCIN HCL IN NACL 1.5-0.9 GM/500ML-% IV SOLN
1500.00 mg | Freq: Once | INTRAVENOUS | Status: AC
Start: 2019-07-21 — End: 2019-07-21
  Administered 2019-07-21: 16:00:00 1500 mg via INTRAVENOUS
  Filled 2019-07-21: qty 500

## 2019-07-21 MED ORDER — FAMOTIDINE 20 MG PO TABS
10.0000 mg | ORAL_TABLET | Freq: Every day | ORAL | Status: DC
Start: 2019-07-21 — End: 2019-07-22
  Filled 2019-07-21: qty 1

## 2019-07-21 MED ORDER — ACETAMINOPHEN 325 MG PO TABS
650.0000 mg | ORAL_TABLET | Freq: Four times a day (QID) | ORAL | Status: DC | PRN
Start: 2019-07-21 — End: 2019-07-31
  Administered 2019-07-21 – 2019-07-28 (×3): 650 mg via ORAL
  Filled 2019-07-21 (×3): qty 2

## 2019-07-21 MED ORDER — GLUCOSE 40 % PO GEL
15.00 g | ORAL | Status: DC | PRN
Start: 2019-07-21 — End: 2019-07-21

## 2019-07-21 MED ORDER — ATORVASTATIN CALCIUM 40 MG PO TABS
40.0000 mg | ORAL_TABLET | Freq: Every day | ORAL | Status: DC
Start: 2019-07-21 — End: 2019-07-31
  Administered 2019-07-22 – 2019-07-31 (×10): 40 mg via ORAL
  Filled 2019-07-21 (×10): qty 1

## 2019-07-21 MED ORDER — GLUCOSE 40 % PO GEL
15.00 g | ORAL | Status: DC | PRN
Start: 2019-07-21 — End: 2019-07-31

## 2019-07-21 MED ORDER — SODIUM CHLORIDE 0.9 % IV BOLUS
250.0000 mL | INTRAVENOUS | Status: AC | PRN
Start: 2019-07-21 — End: 2019-07-21

## 2019-07-21 MED ORDER — NALOXONE HCL 0.4 MG/ML IJ SOLN (WRAP)
0.20 mg | INTRAMUSCULAR | Status: DC | PRN
Start: 2019-07-21 — End: 2019-07-31

## 2019-07-21 MED ORDER — PRISMASATE BGK 4/2.5 CRRT FLUID
Status: DC
Start: 2019-07-21 — End: 2019-07-23

## 2019-07-21 MED ORDER — COLLAGENASE 250 UNIT/GM EX OINT
TOPICAL_OINTMENT | CUTANEOUS | Status: DC
Start: 2019-07-21 — End: 2019-07-31
  Filled 2019-07-21 (×4): qty 30

## 2019-07-21 MED ORDER — INSULIN REGULAR HUMAN 100 UNIT/ML IJ SOLN (IV ONLY)
5.00 [IU] | Freq: Once | Status: AC
Start: 2019-07-21 — End: 2019-07-21
  Administered 2019-07-21: 10:00:00 5 [IU] via INTRAVENOUS
  Filled 2019-07-21: qty 15

## 2019-07-21 MED ORDER — SODIUM CHLORIDE 0.9 % IV BOLUS
1000.00 mL | Freq: Once | INTRAVENOUS | Status: DC
Start: 2019-07-21 — End: 2019-07-21

## 2019-07-21 MED ORDER — INSULIN LISPRO 100 UNIT/ML SC SOLN
1.00 [IU] | Freq: Every evening | SUBCUTANEOUS | Status: DC
Start: 2019-07-21 — End: 2019-07-31
  Administered 2019-07-22 – 2019-07-24 (×3): 1 [IU] via SUBCUTANEOUS
  Administered 2019-07-25: 22:00:00 2 [IU] via SUBCUTANEOUS
  Administered 2019-07-29: 21:00:00 1 [IU] via SUBCUTANEOUS
  Filled 2019-07-21: qty 3
  Filled 2019-07-21: qty 6
  Filled 2019-07-21 (×4): qty 3

## 2019-07-21 MED ORDER — POTASSIUM CHLORIDE 10 MEQ/100ML IV SOLN (WRAP)
10.0000 meq | INTRAVENOUS | Status: DC | PRN
Start: 2019-07-21 — End: 2019-07-23

## 2019-07-21 MED ORDER — ACETAMINOPHEN 650 MG RE SUPP
650.00 mg | Freq: Four times a day (QID) | RECTAL | Status: DC | PRN
Start: 2019-07-21 — End: 2019-07-31

## 2019-07-21 MED ORDER — DEXTROSE 50 % IV SOLN
12.50 g | INTRAVENOUS | Status: DC | PRN
Start: 2019-07-21 — End: 2019-07-31

## 2019-07-21 MED ORDER — DEXTROSE 50 % IV SOLN
12.50 g | INTRAVENOUS | Status: DC | PRN
Start: 2019-07-21 — End: 2019-07-21

## 2019-07-21 MED ORDER — GABAPENTIN 300 MG PO CAPS
300.00 mg | ORAL_CAPSULE | Freq: Every day | ORAL | Status: DC
Start: 2019-07-21 — End: 2019-07-24
  Administered 2019-07-22 – 2019-07-24 (×3): 300 mg via ORAL
  Filled 2019-07-21 (×3): qty 1

## 2019-07-21 MED ORDER — VANCOMYCIN PHARMACY TO DOSE PLACEHOLDER
INTRAVENOUS | Status: DC
Start: 2019-07-21 — End: 2019-07-24

## 2019-07-21 MED ORDER — SODIUM CHLORIDE (PF) 0.9 % IJ SOLN
10.0000 mL | INTRAMUSCULAR | Status: DC | PRN
Start: 2019-07-21 — End: 2019-07-23

## 2019-07-21 MED ORDER — LORAZEPAM 2 MG/ML IJ SOLN
0.50 mg | Freq: Once | INTRAMUSCULAR | Status: AC
Start: 2019-07-21 — End: 2019-07-21
  Administered 2019-07-21: 15:00:00 0.5 mg via INTRAVENOUS
  Filled 2019-07-21: qty 1

## 2019-07-21 MED ORDER — DEXTROSE 50 % IV SOLN
0.00 g | Freq: Once | INTRAVENOUS | Status: AC
Start: 2019-07-21 — End: 2019-07-21
  Administered 2019-07-21: 10:00:00 25 g via INTRAVENOUS
  Filled 2019-07-21: qty 50

## 2019-07-21 MED ORDER — SODIUM BICARBONATE 8.4 % IV SOLN
50.00 meq | Freq: Once | INTRAVENOUS | Status: AC
Start: 2019-07-21 — End: 2019-07-21
  Administered 2019-07-21: 10:00:00 50 meq via INTRAVENOUS
  Filled 2019-07-21: qty 50

## 2019-07-21 MED ORDER — MIDODRINE HCL 5 MG PO TABS
5.0000 mg | ORAL_TABLET | Freq: Two times a day (BID) | ORAL | Status: DC
Start: 2019-07-21 — End: 2019-07-22
  Administered 2019-07-21: 17:00:00 5 mg via ORAL
  Filled 2019-07-21: qty 1

## 2019-07-21 MED ORDER — SODIUM PHOSPHATES 3 MMOLE/ML IV SOLN (WRAP)
30.0000 mmol | INTRAVENOUS | Status: DC | PRN
Start: 2019-07-21 — End: 2019-07-23

## 2019-07-21 MED ORDER — CALCIUM GLUCONATE 10 % IV SOLN
0.0000 mL/h | INTRAVENOUS | Status: DC
Start: 2019-07-21 — End: 2019-07-23
  Filled 2019-07-21: qty 100

## 2019-07-21 MED ORDER — NOREPINEPHRINE 8 MG/100 ML (SIMPLE)
0.00 ug/min | Status: DC
Start: 2019-07-21 — End: 2019-07-23
  Filled 2019-07-21: qty 100

## 2019-07-21 MED ORDER — MEROPENEM 500 MG IV SOLR
500.00 mg | INTRAVENOUS | Status: DC
Start: 2019-07-21 — End: 2019-07-21
  Filled 2019-07-21: qty 0.5

## 2019-07-21 MED ORDER — SODIUM CHLORIDE 0.9 % IV BOLUS
500.00 mL | Freq: Once | INTRAVENOUS | Status: AC
Start: 2019-07-21 — End: 2019-07-21
  Administered 2019-07-21: 11:00:00 500 mL via INTRAVENOUS

## 2019-07-21 MED ORDER — MAGNESIUM SULFATE IN D5W 1-5 GM/100ML-% IV SOLN
1.0000 g | INTRAVENOUS | Status: DC | PRN
Start: 2019-07-21 — End: 2019-07-23

## 2019-07-21 MED ORDER — GLUCAGON 1 MG IJ SOLR (WRAP)
1.00 mg | INTRAMUSCULAR | Status: DC | PRN
Start: 2019-07-21 — End: 2019-07-31

## 2019-07-21 MED ORDER — SODIUM CHLORIDE 0.9 % IV BOLUS
500.00 mL | Freq: Once | INTRAVENOUS | Status: AC
Start: 2019-07-21 — End: 2019-07-21
  Administered 2019-07-21: 09:00:00 500 mL via INTRAVENOUS

## 2019-07-21 MED ORDER — NALOXONE HCL 0.4 MG/ML IJ SOLN (WRAP)
0.20 mg | INTRAMUSCULAR | Status: DC | PRN
Start: 2019-07-21 — End: 2019-07-21

## 2019-07-21 MED ORDER — SODIUM CHLORIDE 0.9 % IV BOLUS
100.0000 mL | INTRAVENOUS | Status: AC | PRN
Start: 2019-07-21 — End: 2019-07-21

## 2019-07-21 MED ORDER — MEROPENEM 500 MG IV SOLR
500.00 mg | Freq: Three times a day (TID) | INTRAVENOUS | Status: DC
Start: 2019-07-21 — End: 2019-07-21

## 2019-07-21 NOTE — H&P (Signed)
ADMISSION HISTORY AND PHYSICAL EXAM    Date Time: 07/21/19 9:10 AM  Patient Name: [name]  Attending Physician: Maricela Bo, MD    Assessment and Plan:     This is a 64 year old female known to our service who presents to the hospital with dizziness and acute chest pain:    Acute chest pain:  Etiology--> most likely secondary to fluid overload, questionable cardiac tamponade, PE has been ruled out by perfusion scan  Discussed with Dr. Eldridge Abrahams  Will order stat echocardiogram to rule out tamponade  EKG unremarkable for any changes  First set of troponin negative  Patient transferred to the ICU    Hyperkalemia with metabolic acidosis secondary to ESRD:  Patient was supposed to get dialyzed  RRT was called.  Patient received sodium bicarb, calcium gluconate, D50 and regular insulin  Given hypotension is not a candidate for dialysis will probably need CRRT  Discussed case with nephrologist.  Who stated once patient gets echocardiogram and cardiac tamponade has been ruled out then the patient can get CRRT.    DM2 with hyperglycemia:  History of labile and uncontrolled blood sugars  We will start patient on insulin sliding scale coverage for now  We will perform a diabetic diet    Recent history of right arm pain/edema- triquetral fracture, suspected pseudogout  Acute superficial venous thrombosis s/p dialysis catheter:  We will started on short course of Eliquis but was stopped after she responded to steroid and all the pain was attributed to gout/pseudogout    Recent left upper quadrant abdominal wall cellulitis:   Due to MRSA and Pseudomonas  Completed IV vancomycin and Levaquin  Also seen by plastic surgery.  No intervention.  Local wound care  Wound culture on 6/9--> MRSA and pseudomonas  Wound culture on 6/13--> MRSA and Enterococcal fecalis  Blood culture on 6/8--> negative    Recurrent mechanical falls:  X ray of Rt fore arm done on 6/7-->no new fractures, No malalignment   X ray Rt arm on  6/2--> Unremarkable right humerus  on fall precaution  PT/OT eval once medically stable    ESRD on HD with malfunctiond tunneled dialysis catheter  S/p exchange catheter on 6/10  Follow up with primary Nephrology team    Normocytic anemia - likely AOCD  Recent iron profile suggestive of AOCD    H/o HTN, HLP:  We will hold off on amlodipine and Coreg for now  We will continue atorvastatin    Obesity BMI 45    DVT prophylaxis: On SCDs for now  GI prophylaxis: Resume home regimen      History of Present Illness:   [name] is a 64 y.o. 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 Patientshospitalization from 05/27/2019 to 06/13/2019 due to PD associated peritonitis with Pseudomonas and E. coli s/p starting of hemodialysis on 05/26/2019 and exploratory laparotomy, lysis of adhesion and removal of PD catheter on 05/27/2019. Patient's last hemodialysis done on 06/21/2019 without any complications.    Patient was hospitalized from 06/23/2019 to 06/29/2019 when she presented with right upper extremity severe pain and swelling.  She had a duplex ultrasound that showed concern for clot due to recent dental catheter which showed cephalic vein  thrombus.  At that time she was evaluated by [facility] IR who recommended no intervention.  She was initially started on Eliquis.  She underwent x-ray that showed a triquetral fracture.  She was seen by orthopedics who said that the degree of pain and swelling was hard proportion to fracture and recommended treatment empirically for gout and pseudogout.  She was started on prednisone while the Eliquis was stopped and her symptoms continued to get better.     Patient was recently hospitalized from 06/30/2019 to 07/18/2019 after presenting to the hospital for febrile illness with confusion and mechanical fall.  She was found to have malfunctioning tunneled catheter and had an exchange as of 10 June.  During her hospital stay she had intermittent fevers and her old abdominal wound from prior history of peritoneal dialysis was found to be infected.  She was diagnosed with abdominal wound infection/cellulitis of the abdomen.  Her culture grew MRSA and Pseudomonas.  She was treated with IV vancomycin and levofloxacin. She completed her IV antibiotics in the hospital and was deemed not needing further antibiotics on discharge per infectious disease.  She was also seen by plastic surgery who recommended continue local wound care and follow-up in the office.    The patient was brought in by ambulance because of generalized body aches.  She is residing in a nursing home and per notes from ER reports not receiving attention needed.  She is complaining of stomach pains, chest pain, shortness of breath and generalized body aches for the past 2 days.  When I came to see the patient she said that she is complaining of chest pain that is constant and is worse with deep inspiration.  The pain is sharp in nature and located throughout her chest anteriorly.  The pain is constant and has been there since yesterday.  It is accompanied by shortness of breath as well.  She denies having any fever or chills, nausea or vomiting.    In the ED she was afebrile with T-max 98.2, heart rate of 71, respiratory rate of 14 and SPO2 of 98% on room air, blood pressure 105/55 and map of 69.  Her lab work-up  Was remarkable for blood potassium was 6.2, glucose of 212.  Her imaging was negative for DVT.  She underwent perfusion scan that showed no evidence of PE but did not exclude cardiac tamponade.  She was given Kayexalate in the ED and the case was discussed with nephrology.  She was admitted for further  care.      Significant event:  The patient's blood pressure had dropped to 60/40 and an RRT was called on her.  I came to see the patient and she was hypotensive and complaining of worsening chest pain.  She was placed on Trendelenburg position.  Have repeat labs showed worsening potassium of 6.4.  She was given D50 with insulin, sodium bicarbonate  and IV calcium.  She was also started on 500 cc bolus NaCl.  I discussed the case with ICU and cardiology.  I order a stat echocardiogram and talk to the tech.  Patient's blood pressure improved to 100 over 60s with a map in the 70s.  She was transferred to the ICU.    Past Medical History:     Past Medical History:   Diagnosis Date    Chronic obstructive pulmonary disease     Diabetes mellitus     Hyperlipidemia     Hypertension     Sleep apnea 2018  Past Surgical History:     Past Surgical History:   Procedure Laterality Date    EGD, BIOPSY N/A 05/14/2019    Procedure: EGD, BIOPSY;  Surgeon: Ronie Spies, MD;  Location: [facility];  Service: Gastroenterology;  Laterality: N/A;    EXPLORATORY LAPAROTOMY N/A 05/27/2019    Procedure: EXPLORATORY LAPAROTOMY;  Surgeon: Herbert Moors., MD;  Location: [facility];  Service: General;  Laterality: N/A;    JOINT REPLACEMENT      shoulder    JOINT REPLACEMENT      knee    OVARY SURGERY Left     Removal    REMOVAL, FOREIGN BODY N/A 05/27/2019    Procedure: REMOVAL, PERITONEAL DIALYSIS CATHETER;  Surgeon: Herbert Moors., MD;  Location: [facility];  Service: General;  Laterality: N/A;    TUNNELED CATH CHECK/CHANGE (PERMCATH) N/A 07/03/2019    Procedure: TUNNELED CATH CHECK/CHANGE;  Surgeon: Maureen Ralphs, MD;  Location: [facility];  Service: Interventional Radiology;  Laterality: N/A;    TUNNELED CATH PLACEMENT (PERMCATH) Bilateral 05/23/2019    Procedure: TUNNELED CATH PLACEMENT;  Surgeon: Jacques Earthly, MD;  Location: [facility];  Service: Interventional Radiology;  Laterality:  Bilateral;       Family History:     Family History   Problem Relation Age of Onset    Diabetes Mother     Hypertension Mother     Diabetes Father     Hypertension Father     Diabetes Sister     Hypertension Sister     Diabetes Brother     Hypertension Brother     Diabetes Paternal Aunt     Diabetes Paternal Uncle        Social History:     Social History     Socioeconomic History    Marital status: Legally Separated     Spouse name: Not on file    Number of children: Not on file    Years of education: Not on file    Highest education level: Not on file   Occupational History    Not on file   Tobacco Use    Smoking status: Never Smoker    Smokeless tobacco: Never Used   Brewing technologist Use: Never used   Substance and Sexual Activity    Alcohol use: Never    Drug use: Never    Sexual activity: Not Currently   Other Topics Concern    Not on file   Social History Narrative    Not on file     Social Determinants of Health     Financial Resource Strain:     Difficulty of Paying Living Expenses:    Food Insecurity:     Worried About Charity fundraiser in the Last Year:     Arboriculturist in the Last Year:    Transportation Needs:     Film/video editor (Medical):     Lack of Transportation (Non-Medical):    Physical Activity:     Days of Exercise per Week:     Minutes of Exercise per Session:    Stress:     Feeling of Stress :    Social Connections:     Frequency of Communication with Friends and Family:     Frequency of Social Gatherings with Friends and Family:     Attends Religious Services:     Active Member of Clubs or Organizations:  Attends Archivist Meetings:     Marital Status:    Intimate Partner Violence:     Fear of Current or Ex-Partner:     Emotionally Abused:     Physically Abused:     Sexually Abused:        Allergies:     Allergies   Allergen Reactions    Lisinopril Hives    Metformin Hives       Medications:     Medications Prior to  Admission   Medication Sig    amLODIPine (NORVASC) 5 MG tablet Take 1 tablet (5 mg total) by mouth every 12 (twelve) hours    atorvastatin (LIPITOR) 40 MG tablet Take 40 mg by mouth daily    carvedilol (COREG) 25 MG tablet Take 1 tablet (25 mg total) by mouth every 12 (twelve) hours    collagenase (SANTYL) ointment Apply topically every 24 hours    Dulaglutide (Trulicity) 2.35 TI/1.4ER Solution Pen-injector Inject 0.75 mg into the skin once a week    famotidine (PEPCID) 40 MG tablet Take 40 mg by mouth daily    gabapentin (NEURONTIN) 300 MG capsule Take 1 capsule (300 mg total) by mouth daily    insulin aspart (NovoLOG) 100 UNIT/ML injection Inject 5 Units into the skin 3 (three) times daily before meals (Patient taking differently: Inject 15 Units into the skin 3 (three) times daily before meals   )    insulin glargine (LANTUS) 100 UNIT/ML injection Inject 30 Units into the skin every 12 (twelve) hours (Patient taking differently: Inject 35 Units into the skin nightly   )    lactobacillus/streptococcus (RISAQUAD) Cap Take 1 capsule by mouth daily    lidocaine (LIDODERM) 5 % Place 1 patch onto the skin every 24 hours Remove & Discard patch within 12 hours or as directed by MD    nystatin (NYSTOP) powder Apply 1 application topically 2 (two) times daily    oxyCODONE (ROXICODONE) 5 MG immediate release tablet Take 1 tablet (5 mg total) by mouth every 6 (six) hours as needed for Pain    pantoprazole (PROTONIX) 40 MG tablet Take 1 tablet (40 mg total) by mouth 2 (two) times daily    sevelamer (RENVELA) 800 MG tablet Take 800 mg by mouth 3 (three) times daily with meals    sodium hypochlorite (DAKIN'S QUARTER STRENGTH) 0.125 % Solution Irrigate with as directed daily    vitamin C (ASCORBIC ACID) 500 MG tablet Take 1 tablet (500 mg total) by mouth daily    zinc Oxide (DESITIN) 40 % Paste paste Apply topically every 6 (six) hours    zinc sulfate (ZINCATE) 220 (50 Zn) MG capsule Take 1 capsule (220 mg  total) by mouth daily       Review of Systems:   A comprehensive review of systems was: History obtained from the patient  General ROS: positive for  - fatigue  Psychological ROS: positive for - anxiety  Hematological and Lymphatic ROS: negative  Endocrine ROS: positive for - malaise/lethargy  Breast ROS: Did not examine  Respiratory ROS: no cough, shortness of breath, or wheezing  Cardiovascular ROS: positive for - chest pain and shortness of breath  Gastrointestinal ROS: no abdominal pain, change in bowel habits, or black or bloody stools  Genito-Urinary ROS: negative  Musculoskeletal ROS: positive for - muscular weakness  Neurological ROS: no TIA or stroke symptoms  Dermatological ROS: negative    Physical Exam:     Vitals:    07/21/19 0900  BP: 91/63   Pulse:    Resp:    Temp:    SpO2:        Intake and Output Summary (Last 24 hours) at Date Time  No intake or output data in the 24 hours ending 07/21/19 0910    General appearance - alert, well appearing, and in no distress  Mental status - alert, oriented to person, place, and time, anxious  Neck - supple, no significant adenopathy  Chest - clear to auscultation, no wheezes, rales or rhonchi, symmetric air entry, decreased air entry noted Bilaterally  Heart - normal rate, regular rhythm, normal S1, S2, no murmurs, rubs, clicks or gallops  Abdomen - soft, nontender, nondistended, no masses or organomegaly  Neurological - alert, oriented, normal speech, no focal findings or movement disorder noted  Musculoskeletal - no joint tenderness, deformity or swelling, Mild lower limb edema  Extremities - peripheral pulses normal, no pedal edema, no clubbing or cyanosis, pedal edema +2 +  Skin - normal coloration and turgor, no rashes, no suspicious skin lesions noted    Labs:     Results     Procedure Component Value Units Date/Time    Glucose Whole Blood - POCT [[id number]]  (Abnormal) Collected: 07/21/19 0901     Updated: 07/21/19 0906     Whole Blood Glucose POCT 176  mg/dL     Basic Metabolic Panel [[id number]] Collected: 07/21/19 0858    Specimen: Blood Updated: 07/21/19 0905    Troponin I [[id number]] Collected: 07/21/19 0858    Specimen: Blood Updated: 07/21/19 0905    CBC and differential [[id number]] Collected: 07/21/19 0858    Specimen: Blood Updated: 07/21/19 0905    COVID-19 (SARS-COV-2) ([place] [facility]) [[id number]] Collected: 07/21/19 0152    Specimen: Nasopharyngeal Swab from Nasopharynx Updated: 07/21/19 0324     Purpose of COVID testing Screening     SARS-CoV-2 Specimen Source Nasopharyngeal     SARS CoV 2 Overall Result Negative    Narrative:      o Collect and clearly label specimen type:  o Upper respiratory specimen: One Nasopharyngeal Dry Swab NO  Transport Media.  o Hand deliver to laboratory ASAP  Indication for testing->Extended care facility admission to  semi private room  Screening    B-type Natriuretic Peptide [[id number]]  (Abnormal) Collected: 07/20/19 2052    Specimen: Blood Updated: 07/20/19 2150     B-Natriuretic Peptide 277 pg/mL     Troponin I [[id number]] Collected: 07/20/19 2052    Specimen: Blood Updated: 07/20/19 2135     Troponin I <0.01 ng/mL     Narrative:      Rescheduled by [id number] at 07/20/2019 20:51 Reason: Patient unavailable    Hemolysis index [[id number]]  (Abnormal) Collected: 07/20/19 2052     Updated: 07/20/19 2132     Hemolysis Index -1    Narrative:      Rescheduled by [id number] at 07/20/2019 20:51 Reason: Patient unavailable    GFR [[id number]] Collected: 07/20/19 2052     Updated: 07/20/19 2132     EGFR 5.3    Narrative:      Rescheduled by [id number] at 07/20/2019 20:51 Reason: Patient unavailable    Comprehensive metabolic panel [[id number]]  (Abnormal) Collected: 07/20/19 2052    Specimen: Blood Updated: 07/20/19 2132     Glucose 212 mg/dL      BUN 91.0 mg/dL      Creatinine 9.1 mg/dL      Sodium 133 mEq/L      Potassium 6.2 mEq/L  Chloride 95 mEq/L      CO2 23 mEq/L      Calcium 8.9 mg/dL      Protein, Total 7.5 g/dL      Albumin 2.8 g/dL       AST (SGOT) 10 U/L      ALT 11 U/L      Alkaline Phosphatase 109 U/L      Bilirubin, Total 0.3 mg/dL      Globulin 4.7 g/dL      Albumin/Globulin Ratio 0.6     Anion Gap 15.0    Narrative:      Rescheduled by [id number] at 07/20/2019 20:51 Reason: Patient unavailable    Blood Culture Aerobic/Anaerobic #1 [[id number]] Collected: 07/20/19 2052    Specimen: Arm from Blood, Venipuncture Updated: 07/20/19 2126    Narrative:      Rescheduled by [id number] at 07/20/2019 20:51 Reason: Patient unavailable  Rescheduled by [id number] at 07/20/2019 20:51 Reason: Patient unavailable  1 BLUE+1 PURPLE    Blood Culture Aerobic/Anaerobic #2 [[id number]] Collected: 07/20/19 2052    Specimen: Arm from Blood, Venipuncture Updated: 07/20/19 2126    Narrative:      Rescheduled by [id number] at 07/20/2019 20:51 Reason: Patient unavailable  Rescheduled by [id number] at 07/20/2019 20:51 Reason: Patient unavailable  1 BLUE+1 PURPLE    CBC and differential [[id number]]  (Abnormal) Collected: 07/20/19 2052    Specimen: Blood Updated: 07/20/19 2113     WBC 9.77 x10 3/uL      Hgb 7.4 g/dL      Hematocrit 25.0 %      Platelets 171 x10 3/uL      RBC 2.74 x10 6/uL      MCV 91.2 fL      MCH 27.0 pg      MCHC 29.6 g/dL      RDW 14 %      MPV 11.2 fL      Neutrophils 70.6 %      Lymphocytes Automated 14.9 %      Monocytes 12.7 %      Eosinophils Automated 1.2 %      Basophils Automated 0.2 %      Immature Granulocytes 0.4 %      Nucleated RBC 0.0 /100 WBC      Neutrophils Absolute 6.89 x10 3/uL      Lymphocytes Absolute Automated 1.46 x10 3/uL      Monocytes Absolute Automated 1.24 x10 3/uL      Eosinophils Absolute Automated 0.12 x10 3/uL      Basophils Absolute Automated 0.02 x10 3/uL      Immature Granulocytes Absolute 0.04 x10 3/uL      Absolute NRBC 0.00 x10 3/uL     Narrative:      Rescheduled by [id number] at 07/20/2019 20:51 Reason: Patient unavailable          Recent CBC   Recent Labs     07/21/19  0858   RBC 2.79*   Hgb 7.6*   Hematocrit 25.5*   MCV 91.4   MCH 27.2   MCHC  29.8*   RDW 14   MPV 10.6     Recent BMP   Recent Labs     07/21/19  0858   Glucose 210*   BUN 100.0*   Creatinine 9.9*   Calcium 9.4   Sodium 132*   Potassium 6.4*   Chloride 94*   CO2 18*     Recent CMP   Recent Labs     07/21/19  0858  07/20/19  2052 07/20/19  2052   Glucose 210*   < > 212*   BUN 100.0*   < > 91.0*   Creatinine 9.9*   < > 9.1*   Sodium 132*   < > 133*   CO2 18*   < > 23   Calcium 9.4   < > 8.9   Albumin  --   --  2.8*   AST (SGOT)  --   --  10   ALT  --   --  11   Globulin  --   --  4.7*    < > = values in this interval not displayed.     Recent CARDIAC ENZYMES No results for input(s): TROPONIN, ISTATTROPONI, CK, CKMB in the last 24 hours.    Invalid input(s): TROPONINT  Recent TSH No results for input(s): TSH in the last 24 hours.  Recent URINALYSIS Invalid input(s): UMACB, UTYP, UCOLR, UAPP, USPG, UPH, ULEU, UNIT, UPRO, UGLU, UKET, UURO, UBIL, UBLD, UTYPP  Recent URINALYSIS WITH MICROSCOPIC Invalid input(s): UMICB, UWBC, URBC, USEPI, UREPI, UTEPI, UTRPH, UAMOR, UCAOX, URACY, UCAPH, UCABC, UWBCC, UBAC, UTRIC, UABCY, UCHOL, UBICY, ULEUC, UTYRO, [place], USULF, USPEM, UFCST, UWCST, UWBCS, URBST, UGRCS, URCST, [place], [place], UMUCS, UMICP, UOTH, UAOFB, UBROC, [place], USULF, USPEM, UFCST, UWCST, UWBCS, [place], UGRCS, [place], [place], UYST, [place], UMICP, UOTH, UAOFB, URBOC, UCELL  Recent URIC ACID Invalid input(s): URIC  Recent BLOOD CULTURE No results for input(s): CXBLD in the last 24 hours.  Recent URINE CULTURE Invalid input(s): CXURN  Recent OCCULT BLOOD Invalid input(s): OCCBL  Recent PT/PTT No results for input(s): INR, PTT in the last 24 hours.    Invalid input(s): PTI, COUM, COUMP, ACOAG, ACOAP  Recent PT/INR No results for input(s): INR in the last 24 hours.    Invalid input(s): PTI, COUM, COUMP  Recent CBC WITH DIFF   Recent Labs     07/21/19  0858   RBC 2.79*   Hgb 7.6*   Hematocrit 25.5*   MCV 91.4   MCHC 29.8*   RDW 14   MPV 10.6     Recent RENAL PANEL   Recent Labs     07/21/19  0858 07/20/19  2052  07/20/19  2052   Glucose 210*   < > 212*   Sodium 132*   < > 133*   Potassium 6.4*   < > 6.2*   Chloride 94*   < > 95*   CO2 18*   < > 23   BUN 100.0*   < > 91.0*   Calcium 9.4   < > 8.9   Creatinine 9.9*   < > 9.1*   Albumin  --   --  2.8*    < > = values in this interval not displayed.     Recent LFT   Recent Labs     07/20/19  2052   AST (SGOT) 10   ALT 11   Albumin 2.8*   Globulin 4.7*     Recent LIPID PANEL No results for input(s): CHOL, TRIG, HDL in the last 24 hours.    Invalid input(s): LDLC, VLDLC, LRAT  Recent ABG No results for input(s): TEMP, FIO2, RATE, MODE, ETCO2, PEEP in the last 24 hours.    Invalid input(s): APH, APCO2, APO2, AHCO3, ATCO2, ABE, AOSAT, ABGS, ALLEN, STATS, O2DEL, O2FLO, PRESS, VNTMN, PRSUP, TIVOL    Rads:   Radiological Procedure reviewed.    Signed by: Davontay Watlington Ruben Reason

## 2019-07-21 NOTE — Progress Notes (Addendum)
Pt received resting in bed, AAOx4, VSS, NSR on monitor. Pt assisted w/ x2 assist to bedside commode, BM x1. After returning to bed from commode, pt c/o dizziness/lightheaded, 10/10 left sided non-radiating chest pain, and became lethargic; MD paged and aware (see orders). At 0955hrs, dialysis RN alerted writer to low BP. RRT called at that time (see flowsheet and orders). Pt transferred to [facility] with all belongings, report given to [employer]. Daughter Larene Beach updated.

## 2019-07-21 NOTE — ED Provider Notes (Signed)
EMERGENCY DEPARTMENT HISTORY AND PHYSICAL EXAM     None        Date: 07/20/2019  Patient Name: [name]    History of Presenting Illness and Plan     Chief Complaint   Patient presents with    Generalized Body Aches    Chest Pain       History Provided By: Patient    HPI:   HPI documented within ED course below.       Associated symptoms and pertinent negative listed in ROS.    PCP: Pcp, None, MD  SPECIALISTS:    No current facility-administered medications for this encounter.     Current Outpatient Medications   Medication Sig Dispense Refill    amLODIPine (NORVASC) 5 MG tablet Take 1 tablet (5 mg total) by mouth every 12 (twelve) hours      atorvastatin (LIPITOR) 40 MG tablet Take 40 mg by mouth daily      carvedilol (COREG) 25 MG tablet Take 1 tablet (25 mg total) by mouth every 12 (twelve) hours      collagenase (SANTYL) ointment Apply topically every 24 hours 90 g 0    Dulaglutide (Trulicity) 4.81 EH/6.3JS Solution Pen-injector Inject 0.75 mg into the skin once a week      famotidine (PEPCID) 40 MG tablet Take 40 mg by mouth daily      gabapentin (NEURONTIN) 300 MG capsule Take 1 capsule (300 mg total) by mouth daily 30 capsule 0    insulin aspart (NovoLOG) 100 UNIT/ML injection Inject 5 Units into the skin 3 (three) times daily before meals (Patient taking differently: Inject 15 Units into the skin 3 (three) times daily before meals   )      insulin glargine (LANTUS) 100 UNIT/ML injection Inject 30 Units into the skin every 12 (twelve) hours (Patient taking differently: Inject 35 Units into the skin nightly   ) 1 pen 0    lactobacillus/streptococcus (RISAQUAD) Cap Take 1 capsule by mouth daily 30 capsule 0    lidocaine (LIDODERM) 5 % Place 1 patch onto the skin every 24 hours Remove & Discard patch within 12 hours or as directed by MD 30 each 0    nystatin (NYSTOP) powder Apply 1 application topically 2 (two) times daily 30 g 0    oxyCODONE (ROXICODONE) 5 MG immediate release tablet  Take 1 tablet (5 mg total) by mouth every 6 (six) hours as needed for Pain 10 tablet 0    pantoprazole (PROTONIX) 40 MG tablet Take 1 tablet (40 mg total) by mouth 2 (two) times daily      sevelamer (RENVELA) 800 MG tablet Take 800 mg by mouth 3 (three) times daily with meals      sodium hypochlorite (DAKIN'S QUARTER STRENGTH) 0.125 % Solution Irrigate with as directed daily 1 Bottle 0    vitamin C (ASCORBIC ACID) 500 MG tablet Take 1 tablet (500 mg total) by mouth daily 30 tablet 0    zinc Oxide (DESITIN) 40 % Paste paste Apply topically every 6 (six) hours      zinc sulfate (ZINCATE) 220 (50 Zn) MG capsule Take 1 capsule (220 mg total) by mouth daily 30 capsule 0       Past History     Past Medical History:  Past Medical History:   Diagnosis Date    Chronic obstructive pulmonary disease     Diabetes mellitus     Hyperlipidemia     Hypertension     Sleep  apnea 2018       Past Surgical History:  Past Surgical History:   Procedure Laterality Date    EGD, BIOPSY N/A 05/14/2019    Procedure: EGD, BIOPSY;  Surgeon: Ronie Spies, MD;  Location: [facility];  Service: Gastroenterology;  Laterality: N/A;    EXPLORATORY LAPAROTOMY N/A 05/27/2019    Procedure: EXPLORATORY LAPAROTOMY;  Surgeon: Herbert Moors., MD;  Location: [facility];  Service: General;  Laterality: N/A;    JOINT REPLACEMENT      shoulder    JOINT REPLACEMENT      knee    OVARY SURGERY Left     Removal    REMOVAL, FOREIGN BODY N/A 05/27/2019    Procedure: REMOVAL, PERITONEAL DIALYSIS CATHETER;  Surgeon: Herbert Moors., MD;  Location: [facility];  Service: General;  Laterality: N/A;    TUNNELED CATH CHECK/CHANGE (PERMCATH) N/A 07/03/2019    Procedure: TUNNELED CATH CHECK/CHANGE;  Surgeon: Maureen Ralphs, MD;  Location: [facility];  Service: Interventional Radiology;  Laterality: N/A;    TUNNELED CATH PLACEMENT (PERMCATH) Bilateral 05/23/2019    Procedure: TUNNELED CATH PLACEMENT;  Surgeon: Jacques Earthly, MD;   Location: [facility];  Service: Interventional Radiology;  Laterality: Bilateral;       Family History:  Family History   Problem Relation Age of Onset    Diabetes Mother     Hypertension Mother     Diabetes Father     Hypertension Father     Diabetes Sister     Hypertension Sister     Diabetes Brother     Hypertension Brother     Diabetes Paternal Aunt     Diabetes Paternal Uncle        Social History:  Social History     Tobacco Use    Smoking status: Never Smoker    Smokeless tobacco: Never Used   Brewing technologist Use: Never used   Substance Use Topics    Alcohol use: Never    Drug use: Never       Allergies:  Allergies   Allergen Reactions    Lisinopril Hives    Metformin Hives       Review of Systems     Review of Systems   Constitutional: Positive for fatigue. Negative for chills and fever.   HENT: Negative for congestion.    Eyes: Negative for visual disturbance.   Respiratory: Positive for shortness of breath. Negative for cough.    Cardiovascular: Positive for chest pain.   Gastrointestinal: Negative for abdominal pain, nausea and vomiting.   Genitourinary: Negative for dysuria.   Musculoskeletal: Positive for back pain. Negative for myalgias.   Skin: Positive for rash and wound.   Neurological: Negative for dizziness, numbness and headaches.   Psychiatric/Behavioral: Negative for confusion.       Physical Exam   BP 97/53    Pulse 72    Temp 98.2 F (36.8 C) (Oral)    Resp 16    Ht 5\' 2"  (1.575 m)    Wt 107.6 kg    LMP  (LMP Unknown) Comment: post menopausal   SpO2 92%    BMI 43.39 kg/m     Physical Exam  Vitals and nursing note reviewed.   Constitutional:       Appearance: She is well-developed.      Comments: BMI 43. 39. Patient appears drowsy   HENT:      Head: Normocephalic and atraumatic.  Eyes:      Pupils: Pupils are equal, round, and reactive to light.   Cardiovascular:      Rate and Rhythm: Normal rate and regular rhythm.      Heart sounds: Normal heart sounds.   Pulmonary:       Effort: Pulmonary effort is normal.      Breath sounds: Normal breath sounds. No wheezing.   Abdominal:      Palpations: Abdomen is soft.      Tenderness: There is no abdominal tenderness.      Comments: Abdominal wound is clean with wet to dry dressing.    Musculoskeletal:         General: Normal range of motion.      Cervical back: Normal range of motion and neck supple.      Right lower leg: No edema.      Left lower leg: No edema.      Comments: velcro wrist splint on RUE.    Skin:     General: Skin is warm and dry.   Neurological:      Mental Status: She is alert and oriented to person, place, and time.   Psychiatric:         Behavior: Behavior normal.         Diagnostic Study Results     Labs -     Results     Procedure Component Value Units Date/Time    COVID-19 (SARS-COV-2) [employer] [facility]) [[id number]] Collected: 07/21/19 0152    Specimen: Nasopharyngeal Swab from Nasopharynx Updated: 07/21/19 0204     Purpose of COVID testing Screening     SARS-CoV-2 Specimen Source Nasopharyngeal    Narrative:      o Collect and clearly label specimen type:  o Upper respiratory specimen: One Nasopharyngeal Dry Swab NO  Transport Media.  o Hand deliver to laboratory ASAP  Indication for testing->Extended care facility admission to  semi private room  Screening    B-type Natriuretic Peptide [[id number]]  (Abnormal) Collected: 07/20/19 2052    Specimen: Blood Updated: 07/20/19 2150     B-Natriuretic Peptide 277 pg/mL     Troponin I [[id number]] Collected: 07/20/19 2052    Specimen: Blood Updated: 07/20/19 2135     Troponin I <0.01 ng/mL     Narrative:      Rescheduled by [id number] at 07/20/2019 20:51 Reason: Patient unavailable    Hemolysis index [[id number]]  (Abnormal) Collected: 07/20/19 2052     Updated: 07/20/19 2132     Hemolysis Index -1    Narrative:      Rescheduled by [id number] at 07/20/2019 20:51 Reason: Patient unavailable    GFR [[id number]] Collected: 07/20/19 2052     Updated: 07/20/19 2132     EGFR 5.3    Narrative:       Rescheduled by [id number] at 07/20/2019 20:51 Reason: Patient unavailable    Comprehensive metabolic panel [[id number]]  (Abnormal) Collected: 07/20/19 2052    Specimen: Blood Updated: 07/20/19 2132     Glucose 212 mg/dL      BUN 91.0 mg/dL      Creatinine 9.1 mg/dL      Sodium 133 mEq/L      Potassium 6.2 mEq/L      Chloride 95 mEq/L      CO2 23 mEq/L      Calcium 8.9 mg/dL      Protein, Total 7.5 g/dL      Albumin 2.8 g/dL      AST (SGOT) 10 U/L  ALT 11 U/L      Alkaline Phosphatase 109 U/L      Bilirubin, Total 0.3 mg/dL      Globulin 4.7 g/dL      Albumin/Globulin Ratio 0.6     Anion Gap 15.0    Narrative:      Rescheduled by [id number] at 07/20/2019 20:51 Reason: Patient unavailable    Blood Culture Aerobic/Anaerobic #1 [[id number]] Collected: 07/20/19 2052    Specimen: Arm from Blood, Venipuncture Updated: 07/20/19 2126    Narrative:      Rescheduled by [id number] at 07/20/2019 20:51 Reason: Patient unavailable  Rescheduled by [id number] at 07/20/2019 20:51 Reason: Patient unavailable  1 BLUE+1 PURPLE    Blood Culture Aerobic/Anaerobic #2 [[id number]] Collected: 07/20/19 2052    Specimen: Arm from Blood, Venipuncture Updated: 07/20/19 2126    Narrative:      Rescheduled by [id number] at 07/20/2019 20:51 Reason: Patient unavailable  Rescheduled by [id number] at 07/20/2019 20:51 Reason: Patient unavailable  1 BLUE+1 PURPLE    CBC and differential [[id number]]  (Abnormal) Collected: 07/20/19 2052    Specimen: Blood Updated: 07/20/19 2113     WBC 9.77 x10 3/uL      Hgb 7.4 g/dL      Hematocrit 25.0 %      Platelets 171 x10 3/uL      RBC 2.74 x10 6/uL      MCV 91.2 fL      MCH 27.0 pg      MCHC 29.6 g/dL      RDW 14 %      MPV 11.2 fL      Neutrophils 70.6 %      Lymphocytes Automated 14.9 %      Monocytes 12.7 %      Eosinophils Automated 1.2 %      Basophils Automated 0.2 %      Immature Granulocytes 0.4 %      Nucleated RBC 0.0 /100 WBC      Neutrophils Absolute 6.89 x10 3/uL      Lymphocytes Absolute Automated 1.46 x10 3/uL      Monocytes  Absolute Automated 1.24 x10 3/uL      Eosinophils Absolute Automated 0.12 x10 3/uL      Basophils Absolute Automated 0.02 x10 3/uL      Immature Granulocytes Absolute 0.04 x10 3/uL      Absolute NRBC 0.00 x10 3/uL     Narrative:      Rescheduled by [id number] at 07/20/2019 20:51 Reason: Patient unavailable          Radiologic Studies -   Radiology Results (24 Hour)     Procedure Component Value Units Date/Time    NM Lung Scan Perfusion Particulate [[id number]] Collected: 07/21/19 0047    Order Status: Completed Updated: 07/21/19 0106    Narrative:        CLINICAL HISTORY:   pleuritic cp, hypoxia, end-stage renal failure    COMPARISON EXAM: Other.    TECHNIQUE:  4.9 mCi Tc 49m MAA was injected intravenously and A SPECT  scan of the chest was done to to demonstrate the lung perfusion.   Additionally a SPECT-CT scan was done  with fusion of SPECT and CT  images images on a separate work station. The low-dose spiral CT, was  registered with the SPECT scan, and utilized for attenuation correction  and anatomic localization. Images were displayed in coronal, sagittal  and axial planes.     Note, this is a nonstandard exam for diagnosing pulmonary embolism.  Ventilation scan was not performed due to COVID 19 pandemic.    FINDINGS:    PERFUSION FINDINGS:   No evidence of segmental mismatch perfusion abnormality     LUNG FINDINGS: CT portion of the study is of nondiagnostic quality,  however the following findings are evident:    Moderate pericardial effusion measuring up to 15 mm in maximal  thickness. Small bilateral pleural effusions.    Septal thickening and mild diffuse groundglass opacities throughout both  lungs with central distribution.    Moderate degenerative changes. Osteopenia. Total left shoulder  replacement prosthesis.      Impression:          NO EVIDENCE FOR PULMONARY EMBOLISM.    Moderate pericardial effusion. Cannot rule out cardiac tamponade.    Small bilateral pleural effusions.    Mild pulmonary edema.       Keith Rake, MD   07/21/2019 1:04 AM    NM Spect CT Fusion Sgl Area Sgl Day [[id number]] Collected: 07/21/19 0047    Order Status: Completed Updated: 07/21/19 0106    Narrative:        CLINICAL HISTORY:   pleuritic cp, hypoxia, end-stage renal failure    COMPARISON EXAM: Other.    TECHNIQUE:  4.9 mCi Tc 68m MAA was injected intravenously and A SPECT  scan of the chest was done to to demonstrate the lung perfusion.   Additionally a SPECT-CT scan was done  with fusion of SPECT and CT  images images on a separate work station. The low-dose spiral CT, was  registered with the SPECT scan, and utilized for attenuation correction  and anatomic localization. Images were displayed in coronal, sagittal  and axial planes.     Note, this is a nonstandard exam for diagnosing pulmonary embolism.  Ventilation scan was not performed due to COVID 19 pandemic.    FINDINGS:    PERFUSION FINDINGS:   No evidence of segmental mismatch perfusion abnormality     LUNG FINDINGS: CT portion of the study is of nondiagnostic quality,  however the following findings are evident:    Moderate pericardial effusion measuring up to 15 mm in maximal  thickness. Small bilateral pleural effusions.    Septal thickening and mild diffuse groundglass opacities throughout both  lungs with central distribution.    Moderate degenerative changes. Osteopenia. Total left shoulder  replacement prosthesis.      Impression:          NO EVIDENCE FOR PULMONARY EMBOLISM.    Moderate pericardial effusion. Cannot rule out cardiac tamponade.    Small bilateral pleural effusions.    Mild pulmonary edema.      Keith Rake, MD   07/21/2019 1:04 AM    [place] VenoDopp Low Extremity Bilateral [[id number]] Collected: 07/21/19 0101    Order Status: Completed Updated: 07/21/19 0105    Narrative:      BILATERAL LOWER EXTREMITY VENOUS DUPLEX ULTRASOUND    DATE OF SERVICE: 07/20/2019 21:30 PM.    HISTORY: 64 years-old Female with generalized body ache x2 days.  History focal right cephalic  thrombosis/thrombophlebitis 06/24/2019.  Concern for DVT. Venous duplex evaluation is requested.    COMPARISON: Right upper extremity venous duplex ultrasound  06/24/2019.    TECHNIQUE: Bilateral lower extremity venous duplex ultrasound was  performed utilizing grayscale, color and spectral Doppler, transverse  compression, and augmentation techniques.    FINDINGS: The bilateral femoropopliteal and deep calf veins are  patent with normal coaptation and no evidence of thrombosis. The  bilateral saphenofemoral junctions  are patent. Normal respiratory  phasicity is present at the iliofemoral junctions bilaterally with no  evidence to suggest central venous obstruction.      Impression:        Negative for DVT or central venous obstruction of the bilateral lower  extremities.    Venu Vadlamudi   07/21/2019 1:03 AM    Chest AP Portable [[id number]] Collected: 07/20/19 2133    Order Status: Completed Updated: 07/20/19 2137    Narrative:      HISTORY: chest pain    TECHNIQUE: Single AP view of the chest.    COMPARISON: 07/01/2019.    FINDINGS: Central pulmonary vascular engorgement present. Right-sided  dual lumen hemodialysis catheter projects over the right atrium. There  is a left retrocardiac opacity. Stable cardiac and mediastinal contours.      Impression:       Mild central pulmonary vascular engorgement.     Left retrocardiac opacity probably representing some combination of  small left pleural effusion and subsegmental atelectasis however a  coexisting left basilar infiltrate cannot be excluded.    Lonia Skinner, MD   07/20/2019 9:35 PM      .    Medical Decision Making   I am the first provider for this patient.    I reviewed the vital signs, available nursing notes, past medical history, past surgical history, family history and social history.    Vital Signs-Reviewed the patient's vital signs.     Patient Vitals for the past 12 hrs:   BP Temp Pulse Resp   07/21/19 0230 97/53  72 16   07/21/19 0200 108/58  73 16    07/21/19 0130 108/61  73 16   07/20/19 2300 96/62  70 15   07/20/19 2200   70 13   07/20/19 2100   69 (!) 24   07/20/19 2030 95/55 98.2 F (36.8 C) 71 14   07/20/19 1900 90/55 (!) 48.7 F (9.3 C) 75 16       Pulse Oximetry Analysis - Normal         ED Course/ Provider Note/ MDM:       ED Course as of Jul 20 253   Mon Jul 21, 2019   0045 EKG interpretation time 2101, sinus rhythm, rate 71, normal PR, normal QRS, no ectopy.  No peak T waves.    [ST]   3123 64 year old woman with end-stage renal disease on hemodialysis discharged on [address] [place] returning to the emergency department with complaints of generalized body pain.  Patient states that whenever she takes a deep breath she is having pain in her chest.  Pain goes to her back.  Patient feels more short of breath than usual.  According to medics patient's oxygen saturations were in the 80s.  Patient was hospitalized for fall with subsequent triquetral fracture.  Right upper extremity is in splint.  Patient also has open abdominal wound from peritoneal dialysis which is healing with wet-to-dry's.    [ST]   0056 Patient's potassium 6.2.  Discussed with on-call nephrology.  Will dialyze in a.m.  Give Kayexalate to allow patient to have 1 bowel movement.  Will consult sound hospitalist for hospitalization.    [ST]      ED Course User Index  [ST] Patrick North, MD               Dr. Randolm Idol is the primary emergency doctor of record.    Diagnosis     Clinical Impression:  1. Shortness of breath        Treatment Plan:   ED Disposition     ED Disposition Condition Date/Time Comment    Observation  Mon Jul 21, 2019  1:43 AM Admitting Physician: Sheppard Coil [phone]   Service:: Medicine [106]   Estimated Length of Stay: < 2 midnights   Tentative Discharge Plan?: Home or Self Care [1]   Does patient need telemetry?: Yes   Telemetry type (separate Telemetry order is also required):: Medical telemetry   Isolation and Infection:: Contact               _______________________________      This note was generated by the Epic EMR system/ Dragon speech recognition and may contain inherent errors or omissions not intended by the user. Grammatical errors, random word insertions, deletions and pronoun errors  are occasional consequences of this technology due to software limitations. Not all errors are caught or corrected. If there are questions or concerns about the content of this note or information contained within the body of this dictation they should be addressed directly with the author for clarification. The use of the ED course in this note was to facilitate documentation. The time stamps of the HPI, ROS, physical exam,EKG interpretation within the ED course reflect the time these elements were documented in the chart, not the time they were physically completed.      _______________________________       Patrick North, MD  07/21/19 [phone]

## 2019-07-21 NOTE — ED Notes (Signed)
[facility]  ED NURSING NOTE FOR THE RECEIVING INPATIENT NURSE   ED NURSE Rachel Moulds [phone]   ED CHARGE RN [phone]   ADMISSION INFORMATION   [name] is a 64 y.o. female admitted with a diagnosis of:    1. Shortness of breath         Isolation: None   Allergies: Lisinopril and Metformin   Holding Orders confirmed? Yes   Belongings Documented? Yes   Home medications sent to pharmacy confirmed? No   NURSING CARE   Patient Comes From:   Mental Status: Assisted Living  alert and oriented   ADL: Dependent with ADLs   Ambulation: Unable to assess   Pertinent Information  and Safety Concerns: N/A     COVID Test sent to lab? Yes   VITAL SIGNS   Time BP Temp Pulse Resp SpO2   0130 108/61 98.2 73 16 94   CT / NIH   CT Head ordered on this patient?  No   NIH/Dysphagia assessment done prior to admission? No   PERSONAL PROTECTIVE EQUIPMENT   Face Shield and Gloves   LAB RESULTS   Labs Reviewed   COMPREHENSIVE METABOLIC PANEL - Abnormal; Notable for the following components:       Result Value    Glucose 212 (*)     BUN 91.0 (*)     Creatinine 9.1 (*)     Sodium 133 (*)     Potassium 6.2 (*)     Chloride 95 (*)     Albumin 2.8 (*)     Alkaline Phosphatase 109 (*)     Globulin 4.7 (*)     Albumin/Globulin Ratio 0.6 (*)     All other components within normal limits    Narrative:     Rescheduled by [id number] at 07/20/2019 20:51 Reason: Patient unavailable   CBC AND DIFFERENTIAL - Abnormal; Notable for the following components:    WBC 9.77 (*)     Hgb 7.4 (*)     Hematocrit 25.0 (*)     RBC 2.74 (*)     MCHC 29.6 (*)     Neutrophils Absolute 6.89 (*)     Monocytes Absolute Automated 1.24 (*)     All other components within normal limits    Narrative:     Rescheduled by [id number] at 07/20/2019 20:51 Reason: Patient unavailable   HEMOLYSIS INDEX - Abnormal; Notable for the following components:    Hemolysis Index -1 (*)     All other components within normal limits    Narrative:     Rescheduled by [id number] at  07/20/2019 20:51 Reason: Patient unavailable   B-TYPE NATRIURETIC PEPTIDE - Abnormal; Notable for the following components:    B-Natriuretic Peptide 277 (*)     All other components within normal limits   CULTURE BLOOD AEROBIC AND ANAEROBIC    Narrative:     Rescheduled by [id number] at 07/20/2019 20:51 Reason: Patient unavailable  Rescheduled by [id number] at 07/20/2019 20:51 Reason: Patient unavailable  1 BLUE+1 PURPLE   CULTURE BLOOD AEROBIC AND ANAEROBIC    Narrative:     Rescheduled by [id number] at 07/20/2019 20:51 Reason: Patient unavailable  Rescheduled by [id number] at 07/20/2019 20:51 Reason: Patient unavailable  1 BLUE+1 PURPLE   COVID-19 (SARS-COV-2)   TROPONIN I    Narrative:     Rescheduled by [id number] at 07/20/2019 20:51 Reason: Patient unavailable   GFR    Narrative:     Rescheduled by [id number]  at 07/20/2019 20:51 Reason: Patient unavailable   URINALYSIS REFLEX TO MICROSCOPIC EXAM - REFLEX TO CULTURE          Ticket to Ride Printed: Yes

## 2019-07-21 NOTE — Progress Notes (Signed)
RRT called for hypotension 60/40.  Pt AOX3. Follows commands. Pt. About to have hemodialysis. K+ level 6.2. Hyperkalemia set initiated- sodium bicarbonate 50 meq IV x1, D50 25g IV x1, Calcium gluconate 1 gm/50 cc IV x1, and 5 units IV Regular Insulin.  Albumin 25% IV x2 bags. During the RRT pt. C/o chest pain. Last BP 110/58, HR 73, sats 100% in 2L NC. Pt transferred to ICU room 2701.

## 2019-07-21 NOTE — Progress Notes (Signed)
Arrived in pt's room, BP was low Pre-dialysis.  Primary RN was in the room and aware of it. Nephrologist, Dr. Conley Canal notified about it and talked to primary RN.  Primary RN called for RRT and  pt was transferred to ICU. Nephrologist talked to attending MD. HD CN called that  Dr. Randel Pigg will check on the pt. and hold HD for now.

## 2019-07-21 NOTE — Progress Notes (Signed)
Critical care medicine initial consult note       [phone]    Date Time: 07/21/19 3:11 PM  Patient Name: [name]  Requesting physician: Evon Slack, MD      Assessment:     Hypotension, volume depletion versus sepsis  Possible sepsis  Possible left lower lobe pneumonia  Prior history of MDRO infection  ESRD on HD previously on PD  Hyperkalemia  Pericardial effusionevaluated by cardiology consultant on urgent basis echocardiogram revealing no evidence of tamponade and preserved LVEF  Anemia  Metabolic acidosis  diabetes mellitus with hyperglycemia  Hyperlipidemia  Sleep apnea  COPD without acute exacerbation  Plan:     Transfer to ICU  Hyperkalemia was treated by internal medicine physician on medical floor  Repeat BMP  Crystalloid and albumin infusion intermittently  Vasopressor support if needed to maintain mean arterial pressure in 65 mmHg  Empiric antibiotics  I discussed the case with cardiology consultant was reviewed the echocardiogram and does not find evidence of cardiac tamponade  Dialysis discussed with nephrologyCRRT planned though she will reevaluate the need if potassium rises based on repeat BMP  ID consultation  Volume resuscitation was limited due to potential for respiratory decompensation as patient is dialysis patient.  This was discussed with the patient she has declined 30 cc/kg bolus.    Discussed with nursing staff   Discussed   Discussed on multidisciplinary rounds  Subjective:     This is a 64 year old female admitted with dizziness and shortness of breath.  Evaluation in the ED revealed no evidence of pulmonary embolus or acute coronary syndrome.  She was admitted to medical floor for further management within develop hypotension and also had hyperkalemia.  She received treatment for hyperkalemia on medical floor and nephrology was is planning to treat the patient with dialysis.  In ICU patient's blood pressure is improved with crystalloid boluses.  Albumin bolus  was given as well.  Patient has not required vasopressor support as of yet.  She is awake and alert and has complaint of vague chest discomfort without cough with some shortness of breath.  She also endorsed the following:  No belly pain  No nausea vomiting  No fevers or chills  No bruising or bleeding  No weakness numbness or tingling face arms or legs    Medications:      Scheduled Meds: PRN Meds:    atorvastatin, 40 mg, Oral, Daily  collagenase, , Topical, Q24H SCH  famotidine, 10 mg, Oral, Daily  gabapentin, 300 mg, Oral, Daily  insulin lispro, 1-6 Units, Subcutaneous, QHS  insulin lispro, 2-10 Units, Subcutaneous, TID AC  lidocaine, 1 patch, Transdermal, Q24H SCH  LORazepam, 0.5 mg, Intravenous, Once  meropenem, 500 mg, Intravenous, Q24H  sevelamer, 800 mg, Oral, TID MEALS  vancomycin, 1,500 mg, Intravenous, Once  vancomycin, , Intravenous, See Admin Instructions            Continuous Infusions:   calcium GLUConate CRRT Infusion      norepinephrine (LEVOPHED) infusion      prismaSATE 4/2.5      prismaSATE 4/2.5      acetaminophen, 650 mg, Q6H PRN   Or  acetaminophen, 650 mg, Q6H PRN  dextrose, 15 g of glucose, PRN   And  dextrose, 12.5 g, PRN   And  glucagon (rDNA), 1 mg, PRN  magnesium sulfate, 1 g, PRN  melatonin, 3 mg, QHS PRN  naloxone, 0.2 mg, PRN  ondansetron, 4 mg, Q6H PRN   Or  ondansetron, 4  mg, Q6H PRN  oxyCODONE, 10 mg, Q3H PRN  potassium chloride, 10 mEq, PRN  potassium chloride, 20 mEq, PRN  sodium chloride (PF), 10 mL, PRN  sodium phosphate IVPB, 20 mmol, PRN  sodium phosphate IVPB, 30 mmol, PRN                Physical Exam:     VITAL SIGNS   Temp:  [48.7 F (9.3 C)-98.5 F (36.9 C)] 97.9 F (36.6 C)  Heart Rate:  [68-75] 71  Resp Rate:  [13-28] 17  BP: (60-115)/(40-71) 115/61  Blood Glucose:  Pulse ox:  Telemetry:     No intake or output data in the 24 hours ending 07/21/19 1511       Invasive ICU Hemodynamics:                 Vent Settings:    IBW:      Lines/Drains/Airways:    Patient  Lines/Drains/Airways Status    Active PICC Line / CVC Line / PIV Line / Drain / Airway / Intraosseous Line / Epidural Line / ART Line / Line / Wound / Pressure Ulcer / NG/OG Tube     Name:   Placement date:   Placement time:   Site:   Days:    Peripheral IV 07/20/19 20 G Left Antecubital   07/20/19    2055    Antecubital   less than 1    Wound 05/27/19 Surgical Incision Abdomen Other (Comment) 4x4, abd, tape    05/27/19    2349    Abdomen   54    Wound 07/06/19 Thigh Right Swellon, hard, and a little open area   07/06/19    1000    Thigh   15    Wound 07/08/19 Moisture Associated Skin Damage (MASD) Perineum   07/08/19    1038    Perineum   12                  Constitutional:   No distress.  Chronically ill-appearing  HENT:   Mouth/Throat: Oropharynx is clear and dry  Eyes: Pupils are equal, round, and reactive to light. No scleral icterus.   Neck: Normal range of motion. Neck supple. No tracheal deviation present.  Cardiovascular: Normal rate, regular rhythm and S1-S2 heart sounds.  Exam reveals no gallop   Pulmonary/Chest: Breathing not labored no Respiratory distress, no wheezing, rales rhonchi.  Equal breath sounds on auscultation  abdominal: Soft. Bowel sounds are normal.  No distention. Tenderness on palpation there is rebound and guarding.  Musculoskeletal: No joint swelling or pain.  No edema or tenderness.  Neurological: Strength is symmetric and intact bilaterally, face symmetric.    Skin: Skin is warm and dry. Capillary refill takes less than 2 seconds. No rash noted.  Not diaphoretic. No erythema. No pallor.   Psychiatric:  has a normal mood and affect.  behavior is normal.         Labs:     Results     Procedure Component Value Units Date/Time    GFR [[id number]] Collected: 07/21/19 1250     Updated: 07/21/19 1323     EGFR 5.2    Basic Metabolic Panel [[id number]]  (Abnormal) Collected: 07/21/19 1250    Specimen: Blood Updated: 07/21/19 1323     Glucose 213 mg/dL      BUN 91.0 mg/dL      Creatinine 9.2  mg/dL      Calcium 8.4 mg/dL  Sodium 136 mEq/L      Potassium 4.9 mEq/L      Chloride 97 mEq/L      CO2 21 mEq/L      Anion Gap 18.0    Hemolysis index [[id number]] Collected: 07/21/19 1250     Updated: 07/21/19 1323     Hemolysis Index 6    Troponin I [[id number]] Collected: 07/21/19 1058    Specimen: Blood Updated: 07/21/19 1213     Troponin I 0.01 ng/mL     Glucose Whole Blood - POCT [[id number]]  (Abnormal) Collected: 07/21/19 1132     Updated: 07/21/19 1210     Whole Blood Glucose POCT 235 mg/dL     MRSA culture - Throat [[id number]] Collected: 07/21/19 1058    Specimen: Culturette from Throat Updated: 07/21/19 1059    MRSA culture - Nares [[id number]] Collected: 07/21/19 1058    Specimen: Culturette from Nares Updated: 07/21/19 1059    Glucose Whole Blood - POCT [[id number]]  (Abnormal) Collected: 07/21/19 0955     Updated: 07/21/19 1000     Whole Blood Glucose POCT 204 mg/dL     Hemolysis index [[id number]] Collected: 07/21/19 0858     Updated: 07/21/19 0940     Hemolysis Index 1    GFR [[id number]] Collected: 07/21/19 0858     Updated: 07/21/19 0940     EGFR 4.8    Basic Metabolic Panel [[id number]]  (Abnormal) Collected: 07/21/19 0858    Specimen: Blood Updated: 07/21/19 0940     Glucose 210 mg/dL      BUN 100.0 mg/dL      Creatinine 9.9 mg/dL      Calcium 9.4 mg/dL      Sodium 132 mEq/L      Potassium 6.4 mEq/L      Chloride 94 mEq/L      CO2 18 mEq/L      Anion Gap 20.0    Troponin I [[id number]] Collected: 07/21/19 0858    Specimen: Blood Updated: 07/21/19 0931     Troponin I <0.01 ng/mL     CBC and differential [[id number]]  (Abnormal) Collected: 07/21/19 0858    Specimen: Blood Updated: 07/21/19 0914     WBC 10.79 x10 3/uL      Hgb 7.6 g/dL      Hematocrit 25.5 %      Platelets 175 x10 3/uL      RBC 2.79 x10 6/uL      MCV 91.4 fL      MCH 27.2 pg      MCHC 29.8 g/dL      RDW 14 %      MPV 10.6 fL      Neutrophils 73.1 %      Lymphocytes Automated 13.1 %      Monocytes 11.5 %      Eosinophils Automated 1.4 %       Basophils Automated 0.3 %      Immature Granulocytes 0.6 %      Nucleated RBC 0.0 /100 WBC      Neutrophils Absolute 7.90 x10 3/uL      Lymphocytes Absolute Automated 1.41 x10 3/uL      Monocytes Absolute Automated 1.24 x10 3/uL      Eosinophils Absolute Automated 0.15 x10 3/uL      Basophils Absolute Automated 0.03 x10 3/uL      Immature Granulocytes Absolute 0.06 x10 3/uL      Absolute NRBC 0.00 x10 3/uL     Glucose Whole Blood - POCT [  [id number]]  (Abnormal) Collected: 07/21/19 0901     Updated: 07/21/19 0906     Whole Blood Glucose POCT 176 mg/dL     COVID-19 (SARS-COV-2) ([facility]) [[id number]] Collected: 07/21/19 0152    Specimen: Nasopharyngeal Swab from Nasopharynx Updated: 07/21/19 0324     Purpose of COVID testing Screening     SARS-CoV-2 Specimen Source Nasopharyngeal     SARS CoV 2 Overall Result Negative    Narrative:      o Collect and clearly label specimen type:  o Upper respiratory specimen: One Nasopharyngeal Dry Swab NO  Transport Media.  o Hand deliver to laboratory ASAP  Indication for testing->Extended care facility admission to  semi private room  Screening    B-type Natriuretic Peptide [[id number]]  (Abnormal) Collected: 07/20/19 2052    Specimen: Blood Updated: 07/20/19 2150     B-Natriuretic Peptide 277 pg/mL     Troponin I [[id number]] Collected: 07/20/19 2052    Specimen: Blood Updated: 07/20/19 2135     Troponin I <0.01 ng/mL     Narrative:      Rescheduled by [id number] at 07/20/2019 20:51 Reason: Patient unavailable    Hemolysis index [[id number]]  (Abnormal) Collected: 07/20/19 2052     Updated: 07/20/19 2132     Hemolysis Index -1    Narrative:      Rescheduled by [id number] at 07/20/2019 20:51 Reason: Patient unavailable    GFR [[id number]] Collected: 07/20/19 2052     Updated: 07/20/19 2132     EGFR 5.3    Narrative:      Rescheduled by [id number] at 07/20/2019 20:51 Reason: Patient unavailable    Comprehensive metabolic panel [[id number]]  (Abnormal) Collected: 07/20/19 2052    Specimen: Blood  Updated: 07/20/19 2132     Glucose 212 mg/dL      BUN 91.0 mg/dL      Creatinine 9.1 mg/dL      Sodium 133 mEq/L      Potassium 6.2 mEq/L      Chloride 95 mEq/L      CO2 23 mEq/L      Calcium 8.9 mg/dL      Protein, Total 7.5 g/dL      Albumin 2.8 g/dL      AST (SGOT) 10 U/L      ALT 11 U/L      Alkaline Phosphatase 109 U/L      Bilirubin, Total 0.3 mg/dL      Globulin 4.7 g/dL      Albumin/Globulin Ratio 0.6     Anion Gap 15.0    Narrative:      Rescheduled by [id number] at 07/20/2019 20:51 Reason: Patient unavailable    Blood Culture Aerobic/Anaerobic #1 [[id number]] Collected: 07/20/19 2052    Specimen: Arm from Blood, Venipuncture Updated: 07/20/19 2126    Narrative:      Rescheduled by [id number] at 07/20/2019 20:51 Reason: Patient unavailable  Rescheduled by [id number] at 07/20/2019 20:51 Reason: Patient unavailable  1 BLUE+1 PURPLE    Blood Culture Aerobic/Anaerobic #2 [[id number]] Collected: 07/20/19 2052    Specimen: Arm from Blood, Venipuncture Updated: 07/20/19 2126    Narrative:      Rescheduled by [id number] at 07/20/2019 20:51 Reason: Patient unavailable  Rescheduled by [id number] at 07/20/2019 20:51 Reason: Patient unavailable  1 BLUE+1 PURPLE    CBC and differential [[id number]]  (Abnormal) Collected: 07/20/19 2052    Specimen: Blood Updated: 07/20/19 2113     WBC 9.77 x10 3/uL      Hgb 7.4 g/dL  Hematocrit 25.0 %      Platelets 171 x10 3/uL      RBC 2.74 x10 6/uL      MCV 91.2 fL      MCH 27.0 pg      MCHC 29.6 g/dL      RDW 14 %      MPV 11.2 fL      Neutrophils 70.6 %      Lymphocytes Automated 14.9 %      Monocytes 12.7 %      Eosinophils Automated 1.2 %      Basophils Automated 0.2 %      Immature Granulocytes 0.4 %      Nucleated RBC 0.0 /100 WBC      Neutrophils Absolute 6.89 x10 3/uL      Lymphocytes Absolute Automated 1.46 x10 3/uL      Monocytes Absolute Automated 1.24 x10 3/uL      Eosinophils Absolute Automated 0.12 x10 3/uL      Basophils Absolute Automated 0.02 x10 3/uL      Immature Granulocytes Absolute 0.04  x10 3/uL      Absolute NRBC 0.00 x10 3/uL     Narrative:      Rescheduled by [id number] at 07/20/2019 20:51 Reason: Patient unavailable            Rads:     NM Lung Scan Perfusion Particulate    Result Date: 07/21/2019  NO EVIDENCE FOR PULMONARY EMBOLISM. Moderate pericardial effusion. Cannot rule out cardiac tamponade. Small bilateral pleural effusions. Mild pulmonary edema. Keith Rake, MD  07/21/2019 1:04 AM    Chest AP Portable    Result Date: 07/20/2019   Mild central pulmonary vascular engorgement. Left retrocardiac opacity probably representing some combination of small left pleural effusion and subsegmental atelectasis however a coexisting left basilar infiltrate cannot be excluded. Lonia Skinner, MD  07/20/2019 9:35 PM    [place] VenoDopp Low Extremity Bilateral    Result Date: 07/21/2019  Negative for DVT or central venous obstruction of the bilateral lower extremities. Venu Vadlamudi  07/21/2019 1:03 AM    NM Spect CT Fusion Sgl Area Sgl Day    Result Date: 07/21/2019  NO EVIDENCE FOR PULMONARY EMBOLISM. Moderate pericardial effusion. Cannot rule out cardiac tamponade. Small bilateral pleural effusions. Mild pulmonary edema. Keith Rake, MD  07/21/2019 1:04 AM        EKG Results     ** No results found for the last 48 hours. **            I reviewed events of last 24 hours including notes, labs and imaging studies.    CRITICAL CARE TIME: 45 minutes        I evaluated this patient independently on the date of service.  I discussed the case with the APP.  I agree with the documentation above.  My time spent on evaluation, management and documentation is independent of all other providers including the APP.  My critical care time was devoted to bedside evaluation and management due to the potential for rapid clinical deterioration from severe physiologic derangement.    Signed by: Evon Slack, MD  Date/Time: 07/21/19       *This note was generated by the Epic EMR system and Dragon speech recognition and may contain errors  or omissions not intended by the user. Grammatical errors, random word insertions, deletions, incomplete sentences among other errors are occasional consequences of this technology.  If there are questions or concerns about the content of  this note or information contained within the body of this dictation they should be addressed directly with the author for clarification.

## 2019-07-21 NOTE — Consults (Signed)
Infectious diseases    Referring physician: Lincoln Brigham  Reason for request: Hypotension    This is a 64 year old female who was discharged 1 week ago the being treated for abdominal wall abscess with MRSA Pseudomonas and Enterococcus.  She also had a periinguinal yeast infection.  She is now readmitted for hypotension and possible sepsis.  She was admitted initially to the floor for hypotension dizziness chest pain or shortness of breath.  PE was ruled out.  She developed hypotension and hyperkalemia and was transferred to the ICU.  Her blood pressure does improve with fluid boluses.  She has been started empirically on vancomycin and meropenem    Past medical history  1.  End-stage renal disease now on hemodialysis has been on peritoneal before the infection  2.  COPD  3.  Diabetes  4.  Hypertension  5.  Hyperlipidemia  6.  Sleep apnea    7.  Right knee replacement  8.  Left shoulder replacement    Medications  Lipitor Pepcid gabapentin insulin lorazepam    No antibiotic allergies    Review of systems  No fever  Chest pain shortness of breath  Lightheadedness and dizziness  No GU complaints, on dialysis  No nausea vomiting or diarrhea  No myalgias or arthralgias  No endocrine or hematological complaints  No active psych issues    Physical examination  Vital signs: Afebrile blood pressure 92/54 pulse 69  General: Nontoxic.  Sluggish but arousable and responsive when aroused  Lungs: No rales rhonchi's or wheezes  Heart: Regular rhythm S1-S2 appreciated no murmur gallop  Abdomen: Bowel sounds heard slightly tender around the wound.  The wound still has the same necrotic agent had a 1 week ago with some light green drainage  Neuro: Global weakness    Labs  WBC is 10.7 hemoglobin 7.6 hematocrit 25.5 platelet count 175,000  BUN 91 creatinine 9.2 CO2 21 lactic acid 1.6  Bilirubin 0.3 alk phos 109 ALT 11 AST 10    Chest x-ray: Pulmonary vascular engorgement    Assessment  1.  Hypertension  2.  No evidence for SIRS  syndrome other than hypotension    Recommendations  1.  Check cultures  2.  Wound culture  3.  Agree with empiric vancomycin and meropenem based on all cultures  Thank you

## 2019-07-21 NOTE — Consults (Signed)
[facility]    Date Time: 07/21/19 12:08 PM  Patient Name: [name]  Requesting Physician: Maricela Bo, MD        Assessment:   Admitted with chest pain and hypotension dizziness hypotension most likely either related to volume or sepsis  Pericardial effusion echocardiogram not consistent with tamponade  Preserved left ventricular function  Type 2 diabetes  Renal failure on peritoneal dialysis   Recent cellulitis  Anemia  VQ scan low probability for pulmonary embolism      Recommendations:   Supportive care pressors fluid resuscitation  Defer evaluation of sepsis to primary team  Patient's outpatient cardiologist is Dr. Lamar Sprinkles in [place].  If patient has not had recent ischemic evaluation could consider given chest pain  Antihypertensives on hold due to hypotension resume as appropriate  Continue statin given diabetes  Could consider colchicine or steroids if patient develops chest pain possible pericarditis.  Will get follow-up echocardiogram limited study most likely Wednesday and thus persistent hypotension recurred  Discussed with ICU team      Signed by: Tarri Glenn, MD    [facility]  NP [place] (8am-5pm)  MD [employer] [phone] or [phone]  Dr Maida Sale [phone] (8am-5pm)  After hours, non urgent consult line [phone]  After Hours, urgent consults [phone]     Reason for Consultation:   Pericardial effusion hypotension      History:   [name] is a 64 y.o. female who presents to the hospital on 07/20/2019 with reported chest pain.  ED notes describe chest pain of the patient denies.  She does admit to shortness of breath.  Her past medical history is significant for hypertension hyperlipidemia diabetes end-stage renal diet disease on dialysis.  She has a history of peritoneal dialysis with a history of peritonitis as well as history of GI bleeding in April 2021.  Patient admits to generalized  myalgias she resides at [facility] describes diffuse pain stomach arms legs back.  She was noted to be hypotensive and an RRT was activated.  A VQ scan was negative for pulmonary embolism but suggested a pericardial effusion echocardiogram showed a moderate pericardial effusion with preserved LV function and no evidence of tamponade.  Patient has responded to fluid resuscitation in the ICU she denies any current chest pain      Past Medical History:     Past Medical History:   Diagnosis Date    Chronic obstructive pulmonary disease     Diabetes mellitus     Hyperlipidemia     Hypertension     Sleep apnea 2018       Past Surgical History:     Past Surgical History:   Procedure Laterality Date    EGD, BIOPSY N/A 05/14/2019    Procedure: EGD, BIOPSY;  Surgeon: Ronie Spies, MD;  Location: [facility];  Service: Gastroenterology;  Laterality: N/A;    EXPLORATORY LAPAROTOMY N/A 05/27/2019    Procedure: EXPLORATORY LAPAROTOMY;  Surgeon: Herbert Moors., MD;  Location: [facility];  Service: General;  Laterality: N/A;    JOINT REPLACEMENT      shoulder    JOINT REPLACEMENT      knee    OVARY SURGERY Left     Removal    REMOVAL, FOREIGN BODY N/A 05/27/2019    Procedure: REMOVAL, PERITONEAL DIALYSIS CATHETER;  Surgeon: Herbert Moors., MD;  Location: [facility];  Service: General;  Laterality: N/A;  TUNNELED CATH CHECK/CHANGE (PERMCATH) N/A 07/03/2019    Procedure: TUNNELED CATH CHECK/CHANGE;  Surgeon: Maureen Ralphs, MD;  Location: [facility];  Service: Interventional Radiology;  Laterality: N/A;    TUNNELED CATH PLACEMENT (PERMCATH) Bilateral 05/23/2019    Procedure: TUNNELED CATH PLACEMENT;  Surgeon: Jacques Earthly, MD;  Location: [facility];  Service: Interventional Radiology;  Laterality: Bilateral;       Family History:     Family History   Problem Relation Age of Onset    Diabetes Mother     Hypertension Mother     Diabetes Father     Hypertension Father     Diabetes Sister      Hypertension Sister     Diabetes Brother     Hypertension Brother     Diabetes Paternal Aunt     Diabetes Paternal Uncle        Social History:     Social History     Socioeconomic History    Marital status: Legally Separated     Spouse name: Not on file    Number of children: Not on file    Years of education: Not on file    Highest education level: Not on file   Occupational History    Not on file   Tobacco Use    Smoking status: Never Smoker    Smokeless tobacco: Never Used   Brewing technologist Use: Never used   Substance and Sexual Activity    Alcohol use: Never    Drug use: Never    Sexual activity: Not Currently   Other Topics Concern    Not on file   Social History Narrative    Not on file     Social Determinants of Health     Financial Resource Strain:     Difficulty of Paying Living Expenses:    Food Insecurity:     Worried About Charity fundraiser in the Last Year:     Arboriculturist in the Last Year:    Transportation Needs:     Film/video editor (Medical):     Lack of Transportation (Non-Medical):    Physical Activity:     Days of Exercise per Week:     Minutes of Exercise per Session:    Stress:     Feeling of Stress :    Social Connections:     Frequency of Communication with Friends and Family:     Frequency of Social Gatherings with Friends and Family:     Attends Religious Services:     Active Member of Clubs or Organizations:     Attends Archivist Meetings:     Marital Status:    Intimate Partner Violence:     Fear of Current or Ex-Partner:     Emotionally Abused:     Physically Abused:     Sexually Abused:        Allergies:     Allergies   Allergen Reactions    Lisinopril Hives    Metformin Hives       Hospital Medications:      Scheduled Meds: PRN Meds:    albumin human, 25 g, Intravenous, Once  atorvastatin, 40 mg, Oral, Daily  collagenase, , Topical, Q24H [facility]  famotidine, 10 mg, Oral, Daily  gabapentin, 300 mg, Oral, Daily  insulin  lispro, 1-6 Units, Subcutaneous, QHS  insulin lispro, 2-10 Units, Subcutaneous, TID AC  lidocaine, 1 patch, Transdermal, Q24H [facility]  sevelamer, 800 mg, Oral, TID MEALS        Continuous Infusions:   norepinephrine (LEVOPHED) infusion      acetaminophen, 650 mg, Q6H PRN   Or  acetaminophen, 650 mg, Q6H PRN  albumin human, 100 mL, PRN  dextrose, 15 g of glucose, PRN   And  dextrose, 12.5 g, PRN   And  glucagon (rDNA), 1 mg, PRN  melatonin, 3 mg, QHS PRN  naloxone, 0.2 mg, PRN  ondansetron, 4 mg, Q6H PRN   Or  ondansetron, 4 mg, Q6H PRN  oxyCODONE, 10 mg, Q3H PRN  sodium chloride, 100 mL, Q1H PRN  sodium chloride, 250 mL, PRN            Home Medications:     Medications Prior to Admission   Medication Sig Dispense Refill Last Dose    amLODIPine (NORVASC) 5 MG tablet Take 1 tablet (5 mg total) by mouth every 12 (twelve) hours       atorvastatin (LIPITOR) 40 MG tablet Take 40 mg by mouth daily       carvedilol (COREG) 25 MG tablet Take 1 tablet (25 mg total) by mouth every 12 (twelve) hours       collagenase (SANTYL) ointment Apply topically every 24 hours 90 g 0     Dulaglutide (Trulicity) 1.22 QM/2.5OI Solution Pen-injector Inject 0.75 mg into the skin once a week       famotidine (PEPCID) 40 MG tablet Take 40 mg by mouth daily       gabapentin (NEURONTIN) 300 MG capsule Take 1 capsule (300 mg total) by mouth daily 30 capsule 0     insulin aspart (NovoLOG) 100 UNIT/ML injection Inject 5 Units into the skin 3 (three) times daily before meals (Patient taking differently: Inject 15 Units into the skin 3 (three) times daily before meals   )       insulin glargine (LANTUS) 100 UNIT/ML injection Inject 30 Units into the skin every 12 (twelve) hours (Patient taking differently: Inject 35 Units into the skin nightly   ) 1 pen 0     lactobacillus/streptococcus (RISAQUAD) Cap Take 1 capsule by mouth daily 30 capsule 0     lidocaine (LIDODERM) 5 % Place 1 patch onto the skin every 24 hours Remove & Discard patch within  12 hours or as directed by MD 30 each 0     nystatin (NYSTOP) powder Apply 1 application topically 2 (two) times daily 30 g 0     oxyCODONE (ROXICODONE) 5 MG immediate release tablet Take 1 tablet (5 mg total) by mouth every 6 (six) hours as needed for Pain 10 tablet 0     pantoprazole (PROTONIX) 40 MG tablet Take 1 tablet (40 mg total) by mouth 2 (two) times daily       sevelamer (RENVELA) 800 MG tablet Take 800 mg by mouth 3 (three) times daily with meals       sodium hypochlorite (DAKIN'S QUARTER STRENGTH) 0.125 % Solution Irrigate with as directed daily 1 Bottle 0     vitamin C (ASCORBIC ACID) 500 MG tablet Take 1 tablet (500 mg total) by mouth daily 30 tablet 0     zinc Oxide (DESITIN) 40 % Paste paste Apply topically every 6 (six) hours       zinc sulfate (ZINCATE) 220 (50 Zn) MG capsule Take 1 capsule (220 mg total) by mouth daily 30 capsule 0          Review of Systems:  Comprehensive review of systems including constitutional, eyes, ears, nose, mouth, throat, cardiovascular, GI, GU, musculoskeletal, integumentary, respiratory, neurologic, psychiatric, and endocrine is negative other than what is mentioned already in the history of present illness    Physical Exam:     VITAL SIGNS   Temp:  [48.7 F (9.3 C)-98.5 F (36.9 C)] 98.2 F (36.8 C)  Heart Rate:  [68-75] 69  Resp Rate:  [13-28] 18  BP: (60-110)/(40-71) 81/50  POCT Glucose Result (Read Only)  Avg: 190  Min: 176  Max: 204  SpO2: 100 %  No intake or output data in the 24 hours ending 07/21/19 1208       GENERAL: Patient is in no acute distress   HEENT: No scleral icterus or conjunctival pallor, moist mucous membranes   NECK: No jugular venous distention, normal carotid upstrokes without bruits   CARDIAC: Normal apical impulse, regular rate and rhythm, with normal S1 and S2, and no murmurs, rubs, or gallops   CHEST: Clear to auscultation bilaterally, normal respiratory effort  ABDOMEN: No abdominal bruits, nontender, non-distended, good  bowel sounds, no abnormal aortic pulsations.  EXTREMITIES: No clubbing, cyanosis, or edema  PULSES: Full and equal bilaterally.  SKIN: No rash or jaundice   NEUROLOGIC: Alert and oriented to time, place and person, normal mood and affect   MUSCULOSKELETAL: Normal muscle strength and tone.      Labs:     CBC w/Diff   Recent Labs   Lab 07/21/19  0858 07/20/19  2052 07/18/19  0335   WBC 10.79* 9.77* 10.71*   Hgb 7.6* 7.4* 7.5*   Hematocrit 25.5* 25.0* 25.6*   Platelets 175 171 144          Comprehensive Metabolic Profile   Recent Labs   Lab 07/21/19  0858 07/20/19  2052 07/18/19  0335 07/17/19  0420 07/17/19  0420 07/16/19  0434 07/16/19  0434   Sodium 132* 133* 136  More results in Results Review 134*  More results in Results Review 135*   Potassium 6.4* 6.2* 5.3*  More results in Results Review 5.3*  More results in Results Review 4.7   Chloride 94* 95* 98*  More results in Results Review 96*  More results in Results Review 97*   CO2 18* 23 23  More results in Results Review 23  More results in Results Review 26   BUN 100.0* 91.0* 73.0*  More results in Results Review 77.0*  More results in Results Review 53.0*   Creatinine 9.9* 9.1* 7.3*  More results in Results Review 7.3*  More results in Results Review 5.6*   Calcium 9.4 8.9 8.8  More results in Results Review 8.4*  More results in Results Review 9.0   Albumin  --  2.8* 2.5*  --  2.6*  More results in Results Review 2.7*   Protein, Total  --  7.5  --   --   --   --   --    Bilirubin, Total  --  0.3  --   --   --   --   --    Alkaline Phosphatase  --  109*  --   --   --   --   --    ALT  --  11  --   --   --   --   --    AST (SGOT)  --  10  --   --   --   --   --  Glucose 210* 212* 141*  More results in Results Review 229*  More results in Results Review 239*   Magnesium  --   --  2.1  --  2.0  --  1.9   More results in Results Review = values in this interval not displayed.          Cardiac Enzymes   Recent Labs   Lab 07/21/19  0858 07/20/19  2052   Troponin I  <0.01 <0.01       Lab Results   Component Value Date    BNP 277 (H) 07/20/2019          Thyroid Studies          Invalid input(s): FREET4        Lipid Profile            Coagulation Studies              EKG:   Sinus rhythm poor R wave progression no acute ST or T wave changes no change from prior study    Telemetry:   Sinus rhythm    Rads:   XR Humerus Right AP Lateral    Result Date: 06/25/2019  CLINICAL INDICATION:  Right arm swelling COMPARISON:  None available INTERPRETATION:    AP and lateral views of the right humerus demonstrate no acute fracture or dislocation. There is no osseous destruction or periostitis.  No abnormal soft tissue calcification or radiopaque foreign body is identified.       Unremarkable right humerus. Alba Destine, MD  06/25/2019 7:26 PM    Wrist Right PA Lateral And Oblique    Result Date: 06/30/2019  History: Fall.      4 views of the right wrist were compared to a similar study from 06/25/2019. No new fractures seen. No malalignment. Soft tissue swelling present. Atherosclerosis. Lorretta Harp, DO  06/30/2019 11:28 PM    XR Wrist Right PA Lateral And Oblique    Result Date: 06/25/2019  CLINICAL INDICATION: Right wrist swelling COMPARISON: None available INTERPRETATION:    Four views of the right wrist demonstrate a triquetral fracture. No dislocation. Remainder the bony structures are unremarkable. The carpal joint is maintained.      Triquetral fracture. Alba Destine, MD  06/25/2019 7:28 PM    CT Head WO Contrast    Result Date: 07/01/2019  CT HEAD WO CONTRAST HISTORY: Altered mental status. COMPARISON: 06/30/2019 TECHNIQUE: Multiple axial images were obtained from the base of the skull to the vertex without contrast. Sagittal and coronal reconstructions were performed. One of the following dose optimization techniques was utilized in the performance of this exam: Automated exposure control; adjustment of the mA and/or kV according to the patient's size; or use of an iterative  reconstruction  technique. Specific details can be referenced in the facility's radiology CT exam operational policy. FINDINGS: The ventricles and basal cisterns are normal in size. There is no hemorrhage, mass, or midline shift. There is no evidence of acute infarct. The bones are normal.  The visualized paranasal sinuses are normal. There is left posterior parietal soft tissue swelling similar to the prior exam.     Stable left posterior parietal soft tissue swelling with no acute intracranial process. Delaney Meigs, MD  07/01/2019 11:48 PM    CT Head without Contrast    Result Date: 06/30/2019  HISTORY: head injury ams COMPARISON: 06/23/2019. TECHNIQUE: Helical CT of the head was performed without intravenous contrast. Multiplanar reconstructions were reviewed. A combination of automatic  exposure control, adjustment of the mA and or KV according to patient size, and/or use of iterative reconstruction technique was utilized. FINDINGS: There is no acute intracranial hemorrhage, mass effect or midline shift. Gray-white matter differentiation is preserved. No acute territorial stroke hypodensity is demonstrated. Mild involutional changes. The basal cisterns are patent. The globes are intact. Clear paranasal sinuses and mastoid air cells. The calvarium is intact. Posterior scalp hematoma noted.      No acute intracranial findings demonstrated. No acute skull fracture. Posterior scalp hematoma. Lorretta Harp, DO  06/30/2019 11:43 PM    CT Head WO Contrast    Result Date: 06/23/2019  INDICATION: Cerebrovascular accident. TECHNIQUE: Multiple transaxial images of the head were obtained. No intravenous contrast was administered. A combination of automatic exposure control, adjustment of the mA and or KV according to patient size, and/or use of iterative reconstruction technique was utilized. FINDINGS: No intra-axial mass lesion, hemorrhage, or territorial infarction is demonstrated. Ventricular system is within normal limits. There are 2 small (3  mm) lipomas in the right cerebellopontine angle. No regions of abnormal density are noted within the brain parenchyma. The visualized portions of the sinuses and mastoid air cells are clear.      No intra-axial mass lesion or hemorrhage is demonstrated. Steward Drone, MD  06/23/2019 1:26 PM    CT Cervical Spine without Contrast    Result Date: 06/30/2019  Clinical History: Altered mental status. Examination: CT Cervical spine without contrast. CT images were acquired utilizing Automated Exposure Control for dose reduction. Comparison: None available. Findings: Limitations: Body habitus. The cervical alignment is intact.  No acute cervical spine fracture is identified.  The vertebral body heights are intact.  No suspicious osseous lesions are identified.  The craniocervical junction appears intact. There are mild degenerative disc changes and facet arthropathy. There is no prevertebral soft tissue swelling. There is a partially visualized right-sided central venous catheter.     No evidence of acute cervical spine fracture or malalignment. Gillermina Phy, MD  06/30/2019 11:56 PM    MRI Brain WO Contrast    Result Date: 06/23/2019  Clinical History: right sided weakness and numbness Examination: Multiplanar MRI of the brain was performed without intravenous contrast, using standard departmental protocol. Comparison: CT head dated 06/23/2019 MRI brain dated 03/19/2006 Findings: Diffusion weighted images of the brain demonstrate no evidence of acute infarction.  There is no evidence of acute intracranial hemorrhage, extra-axial collection, mass effect, midline shift, herniation or hydrocephalus.  The ventricles, sulci and cisterns appear age appropriate.   There are mild periventricular white matter T2 FLAIR hyperintensities, which most likely represent chronic microangiopathic changes. There are no signal abnormalities on the susceptibility weighted sequences.   The major vascular flow voids are present.    The visualized  paranasal sinuses and mastoid air cells are clear.   The surrounding soft tissues and osseous structures are unremarkable.     No evidence of acute infarction, intracranial hemorrhage, mass effect or hydrocephalus. Gillermina Phy, MD  06/23/2019 8:04 PM    NM Lung Scan Perfusion Particulate    Result Date: 07/21/2019  CLINICAL HISTORY:   pleuritic cp, hypoxia, end-stage renal failure COMPARISON EXAM: Other. TECHNIQUE:  4.9 mCi Tc 55m MAA was injected intravenously and A SPECT scan of the chest was done to to demonstrate the lung perfusion. Additionally a SPECT-CT scan was done  with fusion of SPECT and CT images images on a separate work station. The low-dose spiral CT, was registered with the SPECT scan,  and utilized for attenuation correction and anatomic localization. Images were displayed in coronal, sagittal and axial planes. Note, this is a nonstandard exam for diagnosing pulmonary embolism. Ventilation scan was not performed due to COVID 19 pandemic. FINDINGS: PERFUSION FINDINGS: No evidence of segmental mismatch perfusion abnormality LUNG FINDINGS: CT portion of the study is of nondiagnostic quality, however the following findings are evident: Moderate pericardial effusion measuring up to 15 mm in maximal thickness. Small bilateral pleural effusions. Septal thickening and mild diffuse groundglass opacities throughout both lungs with central distribution. Moderate degenerative changes. Osteopenia. Total left shoulder replacement prosthesis.     NO EVIDENCE FOR PULMONARY EMBOLISM. Moderate pericardial effusion. Cannot rule out cardiac tamponade. Small bilateral pleural effusions. Mild pulmonary edema. Keith Rake, MD  07/21/2019 1:04 AM    Chest AP Portable    Result Date: 07/20/2019  HISTORY: chest pain TECHNIQUE: Single AP view of the chest. COMPARISON: 07/01/2019. FINDINGS: Central pulmonary vascular engorgement present. Right-sided dual lumen hemodialysis catheter projects over the right atrium. There is a left  retrocardiac opacity. Stable cardiac and mediastinal contours.      Mild central pulmonary vascular engorgement. Left retrocardiac opacity probably representing some combination of small left pleural effusion and subsegmental atelectasis however a coexisting left basilar infiltrate cannot be excluded. Lonia Skinner, MD  07/20/2019 9:35 PM    XR Chest AP Portable    Result Date: 07/02/2019  HISTORY:? Infiltrate. COMPARISON: 03/18/2006 TECHNIQUE: XR CHEST AP PORTABLE FINDINGS: Lines and tubes: Right central venous catheter is present with the tip in the region of the SVC. Lungs and hila: The lungs are clear. There is no pleural effusion. There is no pneumothorax. Heart and Mediastinum: Normal Bones and soft tissues: No acute abnormality. Upper abdomen: Normal     No acute process. Delaney Meigs, MD  07/02/2019 12:36 AM    US Venous Up Extrem Duplex Dopp Uni Right    Result Date: 06/24/2019  RIGHT UPPER EXTREMITY VENOUS DUPLEX EXAM PERFORMED 06/24/2019. INDICATIONS: Upper extremity pain and swelling with question of venous thrombotic disease. TECHNIQUE: Duplex evaluation of the right upper extremity was performed with grayscale, color-flow and gated Doppler technique assessing the central venous structures. INTERPRETATION: There is normal reflected cardiac pulsatility and respiratory phasicity in the right axillary, subclavian and internal jugular veins. There is no evidence of central venous thrombosis or occlusive disease. There is short segment superficial vein thrombosis of the right cephalic vein at the level of the antecubital fossa where a previous IV was. There is normal coaptation with transverse compression of right brachial and basilic veins. Limited evaluation of the contralateral, left upper extremity demonstrates a patent left internal jugular vein and left innominate vein.      Focal superficial vein thrombosis of the right cephalic vein in the antecubital fossa. No DVT. Graciella Freer, MD  06/24/2019 2:03 PM    [place]  VenoDopp Low Extremity Bilateral    Result Date: 07/21/2019  BILATERAL LOWER EXTREMITY VENOUS DUPLEX ULTRASOUND DATE OF SERVICE: 07/20/2019 21:30 PM. HISTORY: 64 years-old Female with generalized body ache x2 days. History focal right cephalic thrombosis/thrombophlebitis 06/24/2019. Concern for DVT. Venous duplex evaluation is requested. COMPARISON: Right upper extremity venous duplex ultrasound 06/24/2019. TECHNIQUE: Bilateral lower extremity venous duplex ultrasound was performed utilizing grayscale, color and spectral Doppler, transverse compression, and augmentation techniques. FINDINGS: The bilateral femoropopliteal and deep calf veins are patent with normal coaptation and no evidence of thrombosis. The bilateral saphenofemoral junctions are patent. Normal respiratory phasicity is present at the iliofemoral junctions bilaterally  with no evidence to suggest central venous obstruction.     Negative for DVT or central venous obstruction of the bilateral lower extremities. Venu Vadlamudi  07/21/2019 1:03 AM    NM Spect CT Fusion Sgl Area Sgl Day    Result Date: 07/21/2019  CLINICAL HISTORY:   pleuritic cp, hypoxia, end-stage renal failure COMPARISON EXAM: Other. TECHNIQUE:  4.9 mCi Tc 68m MAA was injected intravenously and A SPECT scan of the chest was done to to demonstrate the lung perfusion. Additionally a SPECT-CT scan was done  with fusion of SPECT and CT images images on a separate work station. The low-dose spiral CT, was registered with the SPECT scan, and utilized for attenuation correction and anatomic localization. Images were displayed in coronal, sagittal and axial planes. Note, this is a nonstandard exam for diagnosing pulmonary embolism. Ventilation scan was not performed due to COVID 19 pandemic. FINDINGS: PERFUSION FINDINGS: No evidence of segmental mismatch perfusion abnormality LUNG FINDINGS: CT portion of the study is of nondiagnostic quality, however the following findings are evident: Moderate  pericardial effusion measuring up to 15 mm in maximal thickness. Small bilateral pleural effusions. Septal thickening and mild diffuse groundglass opacities throughout both lungs with central distribution. Moderate degenerative changes. Osteopenia. Total left shoulder replacement prosthesis.     NO EVIDENCE FOR PULMONARY EMBOLISM. Moderate pericardial effusion. Cannot rule out cardiac tamponade. Small bilateral pleural effusions. Mild pulmonary edema. Keith Rake, MD  07/21/2019 1:04 AM    Tunneled Cath Check/Change (Permcath)    Result Date: 07/07/2019  PREOPERATIVE DIAGNOSIS: Malfunctioning tunneled dialysis catheter POSTOPERATIVE DIAGNOSIS: Same OPERATOR: Berline Lopes, MD PROCEDURE: 1. Tunneled dialysis catheter check with contrast 2. Right internal jugular tunneled dialysis catheter exchange TECHNIQUE: Following informed consent, the patient was brought to interventional radiology and placed in a supine position. The right internal jugular tunneled dialysis catheter was prepped and draped in the usual sterile fashion. Contrast was injected through the tunnel dialysis catheter lumen and venography was performed. Local anesthesia was acquired with subcutaneous 1% lidocaine. The catheter was then exchanged over wire for a new 23 cm tip to cuff tunneled dialysis catheter. A spot fluoroscopic image was obtained. The catheter was secured to the skin with 2-0 Prolene suture. A sterile bandage was applied. FINDINGS: Venography does not demonstrate a fibrin sheath. Satisfactory positioning of the new catheter within the right atrium. COMPLICATIONS: none ANESTHESIA: Local anesthesia with 1% lidocaine FLUORO DOSE:  636.3 uGym2      Successful right internal jugular tunneled dialysis catheter exchange Nelma Rothman, MD  07/03/2019 2:21 PM    Echocardiogram Adult Comp W Clr/Dop/Bubble    Result Date: 07/21/2019  Name:     CIDNEY KIRKWOOD Age:     58 years DOB:     February 10, 1955 Gender:     Female MRN:     [id number] Systolic BP:      87 mmHg Diastolic BP:     71 mmHg Technical Quality:     Adequate Exam Date/Time:     07/21/2019 11:03 AM Exam Location:     Echo Lab Exam Type:     ECHOCARDIOGRAM ADULT COMP W CLR/ DOPP W[place] INC BUBBLE STDY Staff Sonographer:     Curly Shores, RCS Ordering Physician:     Maricela Bo Study Info Indications      - rule out cardiac tamponade Procedure   Complete two-dimensional, color flow and spectral Doppler transthoracic echocardiogram is performed. Summary   * Left ventricular ejection fraction is normal with an  estimated ejection fraction of 65-70%.   * There is mild mitral regurgitation.   * Small to moderate cirumferential pericardial effusion visualized.   * No  right atrial or right ventricular collapse, ivc with normal size and respiratory variation.  There is increased respiratory variation of the mitral valve inflow, but no frank tampoade or hemodynamic compromise. Findings Left Ventricle   The left ventricle is small.   Left ventricular wall thickness is mildly increased.   Left ventricular ejection fraction is normal with an estimated ejection fraction of 65-70%.   Left ventricular segmental wall motion is Empty.   Left ventricular diastolic filling parameters Empty. Right Ventricle   The right ventricular cavity size is Empty.   Normal right ventricular systolic function. Left Atrium   The left atrium is Empty. Right Atrium   The right atrium is Empty. Atrial Septum   No evidence of interatrial shunt by color Doppler. Aortic Valve   The aortic valve is tricuspid.   There is no aortic stenosis.   There is no aortic regurgitation. Pulmonary Valve   The pulmonic valve is structurally normal.   There is no pulmonic regurgitation. Mitral Valve   The mitral valve is structurally normal.   There is mild mitral regurgitation.   There is respiratory variation of the mitral valve inflow. Tricuspid Valve   The tricuspid valve is structurally normal.   There is trace to mild tricuspid regurgitation.   No  pulmonary hypertension with estimated right ventricular systolic pressure of 9 mm hg. Pericardium / Pleural Effusion   Small to moderate cirumferential pericardial effusion visualized.   No right atrial or right ventricular collapse, ivc with normal size and respiratory variation. There is increased respiratory variation of the mitral valve inflow, but no frank tampoade or hemodynamic compromise. Inferior Vena Cava   The IVC is normal in size with > 50% respiratory variance consistent with normal RA pressure of 3 mmHg. Aorta   The aortic root is normal in size.   The ascending aorta is normal in size. Measurements 2D Measurements ---------------------------------------------------------------------- Name                                 Value        Normal ---------------------------------------------------------------------- Parasternal 2D ---------------------------------------------------------------------- IVS Diastolic Thickness (2D)       1.17 cm     0.60-0.90 LVID Diastole (2D)                 3.50 cm     7.47-1.85 LVIW Diastolic Thickness (2D)                               1.08 cm     0.60-0.95 LVID Systole (2D)                  2.15 cm     2.20-3.50 LA Dimension (2D)                  3.66 cm     2.70-3.80 Ao Root Diameter (2D)              3.29 cm     2.70-3.70 LV Ejection Fraction 2D ---------------------------------------------------------------------- Visually Estimated EF                 70 %         54-74 M-mode Measurements ----------------------------------------------------------------------  Name                                 Value        Normal ---------------------------------------------------------------------- M-Mode ---------------------------------------------------------------------- TAPSE                              1.61 cm        >=1.60 LVOT/Aortic Valve Doppler Measurements ---------------------------------------------------------------------- Name                                 Value         Normal ---------------------------------------------------------------------- LVOT Doppler ---------------------------------------------------------------------- LVOT Peak Velocity                1.09 m/s               AoV Doppler ---------------------------------------------------------------------- AV Peak Velocity                  1.46 m/s               AV Peak Gradient                    8 mmHg               AV Mean Gradient                    5 mmHg           <20 AV V1/V2 Ratio                        0.75 Mitral Valve Measurements ---------------------------------------------------------------------- Name                                 Value        Normal ---------------------------------------------------------------------- MV Doppler ---------------------------------------------------------------------- MV E Peak Velocity                0.80 m/s               MV A Peak Velocity                0.50 m/s               MV E/A                                1.61               MV Annular TDI ---------------------------------------------------------------------- MV E/e' (Septal)                     31.00        <=8.00 MV E/e' (Lateral)                    32.81        <=8.00 Tricuspid Valve Measurements ---------------------------------------------------------------------- Name                                 Value        Normal ---------------------------------------------------------------------- TV Regurgitation Doppler ---------------------------------------------------------------------- TR Peak Velocity  1.49 m/s               TR Peak Gradient                    9 mmHg Aorta / Venous Measurements ---------------------------------------------------------------------- Name                                 Value        Normal ---------------------------------------------------------------------- IVC/SVC ---------------------------------------------------------------------- IVC Diameter (Exp  2D)              2.25 cm        <=2.10 Report Signatures Finalized by Amelia Jo  MD on 07/21/2019 11:53 AM Promoted by Curly Shores on 07/21/2019 11:39 AM

## 2019-07-21 NOTE — UM Notes (Addendum)
UTILIZATION REVIEW CONTACT: Name:  Milas Gain MSN RN CCM   Utilization Review Case Manager    [facility]  Address:  [address] , [place] ,[place] [address]  NPI:   [id number]  Tax ID:  [id number]  Phone: [phone]  Fax: [phone]  Email: [email or web address]    Auth Number : N/A    ER ADMIT DATE AND TIME: 07/20/2019  6:48 PM  OBS admit:  07/21/19 0143  DATE/TIME OF ADMISSION ORDER: 07/21/19 1508 (CHANGED TO INPATIENT)  LOC: IMCU-ICU   INPATIENT REVIEW FOR 07/21/19    PATIENT NAME: [name],[name]  DOB: [birth year]      ADMISSION REVIEW   History of present illness: Pt is a 64 y.o. female arrived at [facility] on 07/20/2019 at 1848.    Complaints:    Chief Complaint   Patient presents with    Generalized Body Aches    Chest Pain     The pt has a past medical history remarkable for hypertension, hyperlipidemia, insulin-dependent diabetes mellitus with hemoglobin A1c of 9.8 as of April 2021, ESRD on HD Tuesday, Thursday and Saturday, history of peritoneal dialysis associated Pseudomonas/E. coli peritonitis, history of upper GI bleed secondary to gastritis as of May 13, 2019 and anemia of chronic disease. Patientshospitalization from 05/27/2019 to 06/13/2019 due to PD associated peritonitis with Pseudomonas and E. coli s/p starting of hemodialysis on 05/26/2019 and exploratory laparotomy, lysis of adhesion and removal of PD catheter on 05/27/2019. Patient's last hemodialysis done on 06/21/2019 without any complications.  The patient was brought in by ambulance because of generalized body aches.  She is residing in a nursing home and per notes from ER reports not receiving attention needed.  She is complaining of stomach pains, chest pain, shortness of breath and generalized body aches for the past 2 days. She is complaining of chest pain that is constant and is worse with deep inspiration.  The pain is sharp in nature and located throughout her chest anteriorly.  The pain is constant and has been  there since yesterday.  It is accompanied by shortness of breath as well.        PMH:  has a past medical history of Chronic obstructive pulmonary disease, Diabetes mellitus, Hyperlipidemia, Hypertension, and Sleep apnea (2018).  PSH:  has a past surgical history that includes Ovary surgery (Left); Joint replacement; Joint replacement; EGD, BIOPSY (N/A, 05/14/2019); Tunneled Cath Placement (Permcath) (Bilateral, 05/23/2019); REMOVAL, FOREIGN BODY (N/A, 05/27/2019); EXPLORATORY LAPAROTOMY (N/A, 05/27/2019); and Tunneled Cath Check/Change (Permcath) (N/A, 07/03/2019).      DIAGNOSIS:     ICD-10-CM    1. Shortness of breath  R06.02          VS:   Vitals:    07/21/19 1220   BP: 93/51   Pulse: 68   Resp: 18   Temp:    SpO2: 98%      Vital Sign Min/Max (last 24 hours)    Value Min Max   Temp 48.7 F (9.3 C)Abnormal  98.5 F (36.9 C)   Heart Rate 68 75   Resp Rate 13 [id number]    BP: Systolic [id number]  024   BP: Diastolic 40 71   SpO2 92 % 100 %     ON 2L/MIN NC    LABS  Lab 07/21/19  0858 07/20/19  2052   WBC 10.79* 9.77*   Hgb 7.6* 7.4*   Hematocrit 25.5* 25.0*     Lab 07/21/19  1250 07/21/19  0858 07/20/19  2052  Sodium 136 132* 133*   Potassium 4.9 6.4* 6.2*   Chloride 97* 94* 95*   CO2 21* 18* 23   BUN 91.0* 100.0* 91.0*   Creatinine 9.2* 9.9* 9.1*   Calcium 8.4* 9.4 8.9   Albumin  --   --  2.8*   Glucose 213* 210* 212*           Radiologic Studies -   NM Lung Scan Perfusion Particulate    Result Date: 07/21/2019  NO EVIDENCE FOR PULMONARY EMBOLISM. Moderate pericardial effusion. Cannot rule out cardiac tamponade. Small bilateral pleural effusions. Mild pulmonary edema. Keith Rake, MD  07/21/2019 1:04 AM    Chest AP Portable    Result Date: 07/20/2019   Mild central pulmonary vascular engorgement. Left retrocardiac opacity probably representing some combination of small left pleural effusion and subsegmental atelectasis however a coexisting left basilar infiltrate cannot be excluded. Lonia Skinner, MD  07/20/2019 9:35  PM    [place] VenoDopp Low Extremity Bilateral    Result Date: 07/21/2019  Negative for DVT or central venous obstruction of the bilateral lower extremities. Venu Vadlamudi  07/21/2019 1:03 AM    NM Spect CT Fusion Sgl Area Sgl Day    Result Date: 07/21/2019  NO EVIDENCE FOR PULMONARY EMBOLISM. Moderate pericardial effusion. Cannot rule out cardiac tamponade. Small bilateral pleural effusions. Mild pulmonary edema. Keith Rake, MD  07/21/2019 1:04 AM         ED meds:    07/21/2019 0120 sodium polystyrene (KAYEXALATE) 15 GM/60ML suspension 15 g 15 g Oral Given         MD NOTES:  H&P  Assessment and Plan:     This is a 64 year old female known to our service who presents to the hospital with dizziness and acute chest pain:    Acute chest pain:  Etiology--> most likely secondary to fluid overload, questionable cardiac tamponade, PE has been ruled out by perfusion scan  Discussed with Dr. Eldridge Abrahams  Will order stat echocardiogram to rule out tamponade  EKG unremarkable for any changes  First set of troponin negative  Patient transferred to the ICU    Hyperkalemia with metabolic acidosis secondary to ESRD:  Patient was supposed to get dialyzed  RRT was called.  Patient received sodium bicarb, calcium gluconate, D50 and regular insulin  Given hypotension is not a candidate for dialysis will probably need CRRT  Discussed case with nephrologist.  Who stated once patient gets echocardiogram and cardiac tamponade has been ruled out then the patient can get CRRT.    DM2 with hyperglycemia:  History of labile and uncontrolled blood sugars  We will start patient on insulin sliding scale coverage for now  We will perform a diabetic diet    Recent history of right arm pain/edema- triquetral fracture, suspectedpseudogout  Acute superficial venous thrombosis s/p dialysis catheter:  We will started on short course of Eliquis but was stopped after she responded to steroid and all the pain was attributed to gout/pseudogout    Recent left  upper quadrant abdominal wall cellulitis:   Due to MRSA and Pseudomonas  Completed IV vancomycin and Levaquin  Also seen by plastic surgery.  No intervention.  Local wound care  Wound culture on 6/9--> MRSA and pseudomonas  Wound culture on 6/13--> MRSA and Enterococcal fecalis  Blood culture on 6/8--> negative    Recurrent mechanical falls:  X ray of Rt fore arm done on 6/7-->no new fractures, No malalignment   X ray Rt arm on 6/2--> Unremarkable  right humerus  on fall precaution  PT/OT eval once medically stable    ESRD on HD with malfunctiond tunneled dialysis catheter  S/p exchange catheter on 6/10  Follow up with primary Nephrology team    Normocytic anemia - likely AOCD  Recent iron profile suggestive of AOCD    H/o HTN, HLP:  We will hold off on amlodipine and Coreg for now  We will continue atorvastatin    Obesity BMI 45    DVT prophylaxis: On SCDs for now  GI prophylaxis: Resume home regimen    NEPHROLOGY CONSULT NOTES  Assessment:   1. ESRD on HD  - prev on PD  2. Hyperkalemia  3. Hypotension  - no evid of tamponade  4. Anemia in CKD  5. Mild metabolic acidosis    Recommendations:   1. Pt with hypotension- unstable for HD, will start on CVVHDF 4/2.5, net even   2. Aranesp  3. F/u cultures    CARDIOLOGY CONSULT NOTES  Assessment:   Admitted with chest pain and hypotension dizziness hypotension most likely either related to volume or sepsis  Pericardial effusion echocardiogram not consistent with tamponade  Preserved left ventricular function  Type 2 diabetes  Renal failure on peritoneal dialysis   Recent cellulitis  Anemia  VQ scan low probability for pulmonary embolism  Recommendations:   Supportive care pressors fluid resuscitation  Defer evaluation of sepsis to primary team  Patient's outpatient cardiologist is Dr. Lamar Sprinkles in [place].  If patient has not had recent ischemic evaluation could consider given chest pain  Antihypertensives on hold due to hypotension resume as  appropriate  Continue statin given diabetes  Could consider colchicine or steroids if patient develops chest pain possible pericarditis.  Will get follow-up echocardiogram limited study most likely Wednesday and thus persistent hypotension recurred  Discussed with ICU team        Current Medications:   Scheduled Meds:  Current Facility-Administered Medications   Medication Dose Route Frequency    atorvastatin  40 mg Oral Daily    collagenase   Topical Q24H SCH    famotidine  10 mg Oral Daily    gabapentin  300 mg Oral Daily    insulin lispro  1-6 Units Subcutaneous QHS    insulin lispro  2-10 Units Subcutaneous TID AC    lidocaine  1 patch Transdermal Q24H SCH    meropenem  500 mg Intravenous Q8H    midodrine  5 mg Oral BID Meals    sevelamer  800 mg Oral TID MEALS    vancomycin  1,500 mg Intravenous Once    vancomycin   Intravenous See Admin Instructions     Continuous Infusions:   calcium GLUConate CRRT Infusion      norepinephrine (LEVOPHED) infusion      prismaSATE 4/2.5      prismaSATE 4/2.5       PRN Meds:.acetaminophen **OR** acetaminophen, Nursing communication: Adult Hypoglycemia Treatment Algorithm **AND** dextrose **AND** dextrose **AND** glucagon (rDNA), magnesium sulfate, melatonin, naloxone, ondansetron **OR** ondansetron, oxyCODONE, potassium chloride, potassium chloride, sodium chloride (PF), sodium phosphate IVPB, sodium phosphate IVPB

## 2019-07-21 NOTE — Progress Notes (Signed)
Discussion had with Dr. Nadine Counts. Pt's BP is maintaining post fluid resuscitation and K has normalized. After Dr. Allie Dimmer follow up discussion with Dr. Randel Pigg, will hold on CRRT for now. May need CRRT to be initated if patient has recurrent hyperkalemia. Lab orders to follow. Pt has not required IV vasopressors to this point. Will continue to monitor bp measurements and chemistries.

## 2019-07-21 NOTE — Progress Notes (Signed)
Initial Pharmacy Vancomycin Dosing Consult:  Day 1  Vancomycin Indication: Bacteremia/Cellulitis  Vancomycin Target Trough Level: 15 - 25 mg/L pre-dialysis for hemodialysis patients    Pertinent Imaging:   6/27 CXR: Left retrocardiac opacity probably representing some combination of small left pleural effusion and subsegmental atelectasis however a coexisting left basilar infiltrate cannot be excluded.    Pertinent Cultures:     Date Source  Organism & Pertinent Susceptibilities     6/27 Blood cx (x2) pending   6/28 MRSA (nares + throat) pending             As appropriate, contact physician to consider change in therapy if cultures grow organism other than MRSA     Serum creatinine: 9.9 mg/dL (H) 07/21/19 0858  Estimated creatinine clearance: 6.5 mL/min (A)    Baseline SCr: on HD MWF    Nephrotoxic drugs administered in the past 48 hours: Advanced Age (>65Y), Critical Illness/Hypotension    Assessment  Recent Labs   Lab 07/21/19  0858 07/20/19  2052 07/18/19  0335   WBC 10.79* 9.77* 10.71*   Hgb 7.6* 7.4* 7.5*   Hematocrit 25.5* 25.0* 25.6*   Platelets 175 171 144      Recommendations:   Vancomycin 1500 mg IV x 1 post-dialysis today  Ordered random Vancomycin level with am labs on Wednesday (07/23/19)  Pharmacy to follow up & re-dose based on level    Thank you,  Clelia Schaumann, PharmD BCCCP  Phone: [phone]

## 2019-07-21 NOTE — ED Notes (Signed)
External catheter placed on patient.

## 2019-07-21 NOTE — Consults (Addendum)
[facility]  CONSULT  [name], x [phone] [place] Spectralink)      Date Time: 07/21/19 1:46 PM  Patient Name: [name]  Requesting Physician: Diane Slack, MD  Consulting Physician: Diane Mull, MD    Primary Care Physician: Diane Rancher, MD    Reason for Consultation: ESRD      Assessment:   1. ESRD on HD  - prev on PD  2. Hyperkalemia  3. Hypotension  - no evid of tamponade  4. Anemia in CKD  5. Mild metabolic acidosis    Recommendations:   1. Pt with hypotension- unstable for HD, will start on CVVHDF 4/2.5, net even   2. Aranesp  3. F/u cultures      D/w ICU       Addendum - K improved with medical management, will hold off on dialysis today and re-assess tomorrow        Diane Slack, MD, thank you for this consultation.  We will follow the patient with you during this hospitalization.  Please contact me with any questions or issues.    Signed by: Diane Mull, MD  [facility]  [phone] (office)  x [phone] ([place])      -----------------------------------------------------------------------------------------------------------        History of Presenting Illness:   [name] is a 64 y.o. female with HTN, HLD, type 2 DM, ESRD on HD (prev on PD), who presented to the hospital with chest pain.   Pt with multiple recent hospitalizations - most recently 6/7-6/25 for abdominal wound infection/cellulitis at previous PD catheter site.   She states yesterday at the rehab facility she started having chest pain; she denies shortness of breath, n/v, diarrhea. She makes minimal urine now.   Last HD was 3 days ago when she was in the hospital still.     Past Medical History:     Past Medical History:   Diagnosis Date    Chronic obstructive pulmonary disease     Diabetes mellitus     Hyperlipidemia     Hypertension     Sleep apnea 2018   esrd    Available old records reviewed, including:  labs    Past Surgical History:     Past Surgical History:    Procedure Laterality Date    EGD, BIOPSY N/A 05/14/2019    Procedure: EGD, BIOPSY;  Surgeon: Ronie Spies, MD;  Location: [facility];  Service: Gastroenterology;  Laterality: N/A;    EXPLORATORY LAPAROTOMY N/A 05/27/2019    Procedure: EXPLORATORY LAPAROTOMY;  Surgeon: Herbert Moors., MD;  Location: [facility];  Service: General;  Laterality: N/A;    JOINT REPLACEMENT      shoulder    JOINT REPLACEMENT      knee    OVARY SURGERY Left     Removal    REMOVAL, FOREIGN BODY N/A 05/27/2019    Procedure: REMOVAL, PERITONEAL DIALYSIS CATHETER;  Surgeon: Herbert Moors., MD;  Location: [facility];  Service: General;  Laterality: N/A;    TUNNELED CATH CHECK/CHANGE (PERMCATH) N/A 07/03/2019    Procedure: TUNNELED CATH CHECK/CHANGE;  Surgeon: Maureen Ralphs, MD;  Location: [facility];  Service: Interventional Radiology;  Laterality: N/A;    TUNNELED CATH PLACEMENT (PERMCATH) Bilateral 05/23/2019    Procedure: TUNNELED CATH PLACEMENT;  Surgeon: Jacques Earthly, MD;  Location: [facility];  Service: Interventional Radiology;  Laterality: Bilateral;       Family History:     Family History  Problem Relation Age of Onset    Diabetes Mother     Hypertension Mother     Diabetes Father     Hypertension Father     Diabetes Sister     Hypertension Sister     Diabetes Brother     Hypertension Brother     Diabetes Paternal Aunt     Diabetes Paternal Uncle        Social History:     Social History     Socioeconomic History    Marital status: Legally Separated     Spouse name: Not on file    Number of children: Not on file    Years of education: Not on file    Highest education level: Not on file   Occupational History    Not on file   Tobacco Use    Smoking status: Never Smoker    Smokeless tobacco: Never Used   Brewing technologist Use: Never used   Substance and Sexual Activity    Alcohol use: Never    Drug use: Never    Sexual activity: Not Currently   Other Topics Concern    Not on  file   Social History Narrative    Not on file     Social Determinants of Health     Financial Resource Strain:     Difficulty of Paying Living Expenses:    Food Insecurity:     Worried About Charity fundraiser in the Last Year:     Arboriculturist in the Last Year:    Transportation Needs:     Film/video editor (Medical):     Lack of Transportation (Non-Medical):    Physical Activity:     Days of Exercise per Week:     Minutes of Exercise per Session:    Stress:     Feeling of Stress :    Social Connections:     Frequency of Communication with Friends and Family:     Frequency of Social Gatherings with Friends and Family:     Attends Religious Services:     Active Member of Clubs or Organizations:     Attends Archivist Meetings:     Marital Status:    Intimate Partner Violence:     Fear of Current or Ex-Partner:     Emotionally Abused:     Physically Abused:     Sexually Abused:        Allergies:     Allergies   Allergen Reactions    Lisinopril Hives    Metformin Hives       Medications:     Medications Prior to Admission   Medication Sig    amLODIPine (NORVASC) 5 MG tablet Take 1 tablet (5 mg total) by mouth every 12 (twelve) hours    atorvastatin (LIPITOR) 40 MG tablet Take 40 mg by mouth daily    carvedilol (COREG) 25 MG tablet Take 1 tablet (25 mg total) by mouth every 12 (twelve) hours    collagenase (SANTYL) ointment Apply topically every 24 hours    Dulaglutide (Trulicity) 7.34 LP/3.7TK Solution Pen-injector Inject 0.75 mg into the skin once a week    famotidine (PEPCID) 40 MG tablet Take 40 mg by mouth daily    gabapentin (NEURONTIN) 300 MG capsule Take 1 capsule (300 mg total) by mouth daily    insulin aspart (NovoLOG) 100 UNIT/ML injection Inject 5 Units into the skin 3 (three) times daily  before meals (Patient taking differently: Inject 15 Units into the skin 3 (three) times daily before meals   )    insulin glargine (LANTUS) 100 UNIT/ML injection Inject 30  Units into the skin every 12 (twelve) hours (Patient taking differently: Inject 35 Units into the skin nightly   )    lactobacillus/streptococcus (RISAQUAD) Cap Take 1 capsule by mouth daily    lidocaine (LIDODERM) 5 % Place 1 patch onto the skin every 24 hours Remove & Discard patch within 12 hours or as directed by MD    nystatin (NYSTOP) powder Apply 1 application topically 2 (two) times daily    oxyCODONE (ROXICODONE) 5 MG immediate release tablet Take 1 tablet (5 mg total) by mouth every 6 (six) hours as needed for Pain    pantoprazole (PROTONIX) 40 MG tablet Take 1 tablet (40 mg total) by mouth 2 (two) times daily    sevelamer (RENVELA) 800 MG tablet Take 800 mg by mouth 3 (three) times daily with meals    sodium hypochlorite (DAKIN'S QUARTER STRENGTH) 0.125 % Solution Irrigate with as directed daily    vitamin C (ASCORBIC ACID) 500 MG tablet Take 1 tablet (500 mg total) by mouth daily    zinc Oxide (DESITIN) 40 % Paste paste Apply topically every 6 (six) hours    zinc sulfate (ZINCATE) 220 (50 Zn) MG capsule Take 1 capsule (220 mg total) by mouth daily        Scheduled Meds: PRN Meds:    albumin human, 25 g, Intravenous, Once  atorvastatin, 40 mg, Oral, Daily  collagenase, , Topical, Q24H SCH  famotidine, 10 mg, Oral, Daily  gabapentin, 300 mg, Oral, Daily  insulin lispro, 1-6 Units, Subcutaneous, QHS  insulin lispro, 2-10 Units, Subcutaneous, TID AC  lidocaine, 1 patch, Transdermal, Q24H SCH  meropenem, 500 mg, Intravenous, Q24H  sevelamer, 800 mg, Oral, TID MEALS  vancomycin, 1,500 mg, Intravenous, Once  vancomycin, , Intravenous, See Admin Instructions          Continuous Infusions:   norepinephrine (LEVOPHED) infusion      acetaminophen, 650 mg, Q6H PRN   Or  acetaminophen, 650 mg, Q6H PRN  albumin human, 100 mL, PRN  dextrose, 15 g of glucose, PRN   And  dextrose, 12.5 g, PRN   And  glucagon (rDNA), 1 mg, PRN  melatonin, 3 mg, QHS PRN  naloxone, 0.2 mg, PRN  ondansetron, 4 mg, Q6H PRN    Or  ondansetron, 4 mg, Q6H PRN  oxyCODONE, 10 mg, Q3H PRN  sodium chloride, 100 mL, Q1H PRN  sodium chloride, 250 mL, PRN            Review of Systems:   A comprehensive review of systems was per the HPI and below:     General ROS: no f/c, no weight changes  HEENT ROS: no blurry vision, no oral lesions, no epistaxis  Allergy/Immunology ROS: no new allergic reactions  Hematological and Lymphatic ROS: no known bleeding/clotting disorders  Respiratory ROS: negative for cough, shortness of breath, or wheezing  Cardiovascular ROS: +chest pain  Gastrointestinal ROS: negative for abd/flank pain, change in bowel habits  Genito-Urinary ROS: +oliguric  Musculoskeletal ROS:  negative for trauma or falls, arthralgias  Neurological ROS: no focal weakness, no dizziness  Endocrine ROS: no change in libido, no change in hair distribution  Dermatological ROS: no new rashes or lesions      Physical Exam:     Vitals:    07/21/19 1140 07/21/19 1200 07/21/19  1205 07/21/19 1220   BP: (!) 81/50 (!) 79/49 92/52 93/51    Pulse: 69 69 69 68   Resp: 18 20 14 18    Temp:  97.9 F (36.6 C)     TempSrc:  Oral     SpO2: 100% 100% 99% 98%   Weight:       Height:           Intake and Output Summary (Last 24 hours) at Date Time  No intake or output data in the 24 hours ending 07/21/19 1346    Recent weights:  Weight Monitoring 07/14/2019 07/15/2019 07/16/2019 07/18/2019 07/20/2019 07/21/2019 07/21/2019   Height - - - - 157.5 cm - -   Height Method - - - - - - -   Weight 111.131 kg 109 kg 106.9 kg 107.6 kg 107.6 kg 107.321 kg 104.8 kg   Weight Method Bed Scale Bed Scale Bed Scale Bed Scale - Standing Scale -   BMI (calculated) - - - - 43.5 kg/m2 - -       General: awake, appears ill, NAD  HEENT: sclera anicteric, oropharynx clear without lesions, mucous membranes moist  Neck: supple, no lymphadenopathy, no thyromegaly, no JVD, no carotid bruits  Cardiovascular: regular rate and rhythm, no murmurs, rubs or gallops  Lungs: clear to auscultation bilaterally,  without wheezing, rhonchi, or rales  Abdomen: soft, non-tender, non-distended, normoactive bowel sounds, no palpable masses, no hepatosplenomegaly, no rebound or guarding  Extremities: no clubbing, cyanosis, or edema  Neuro: A+O x 3, no gross motor/sensory deficits  Skin: no rashes or lesions noted        Labs:     Recent Labs   Lab 07/21/19  0858 07/20/19  2052 07/18/19  0335   WBC 10.79* 9.77* 10.71*   Hgb 7.6* 7.4* 7.5*   Hematocrit 25.5* 25.0* 25.6*   Platelets 175 171 144     Recent Labs   Lab 07/21/19  1250 07/21/19  0858 07/20/19  2052 07/18/19  0335 07/18/19  0335 07/17/19  0420 07/17/19  0420 07/16/19  0434 07/16/19  0434   Sodium 136 132* 133*  More results in Results Review 136  More results in Results Review 134*  More results in Results Review 135*   Potassium 4.9 6.4* 6.2*  More results in Results Review 5.3*  More results in Results Review 5.3*  More results in Results Review 4.7   Chloride 97* 94* 95*  More results in Results Review 98*  More results in Results Review 96*  More results in Results Review 97*   CO2 21* 18* 23  More results in Results Review 23  More results in Results Review 23  More results in Results Review 26   BUN 91.0* 100.0* 91.0*  More results in Results Review 73.0*  More results in Results Review 77.0*  More results in Results Review 53.0*   Creatinine 9.2* 9.9* 9.1*  More results in Results Review 7.3*  More results in Results Review 7.3*  More results in Results Review 5.6*   Calcium 8.4* 9.4 8.9  More results in Results Review 8.8  More results in Results Review 8.4*  More results in Results Review 9.0   Albumin  --   --  2.8*  --  2.5*  --  2.6*  More results in Results Review 2.7*   Phosphorus  --   --   --   --  5.5*  --  5.2*  --  4.7   Magnesium  --   --   --   --  2.1  --  2.0  --  1.9   Glucose 213* 210* 212*  More results in Results Review 141*  More results in Results Review 229*  More results in Results Review 239*   EGFR 5.2 4.8 5.3  More results in Results  Review 6.8  More results in Results Review 6.8  More results in Results Review 9.2   More results in Results Review = values in this interval not displayed.             Invalid input(s): LEUKOCYTESUR            cc: Diane Slack, MD  Pcp, None, MD

## 2019-07-22 LAB — RENAL FUNCTION PANEL
Albumin: 3.3 g/dL — ABNORMAL LOW (ref 3.5–5.0)
Anion Gap: 18 — ABNORMAL HIGH (ref 5.0–15.0)
BUN: 96 mg/dL — ABNORMAL HIGH (ref 7.0–19.0)
CO2: 21 mEq/L — ABNORMAL LOW (ref 22–29)
Calcium: 8.1 mg/dL — ABNORMAL LOW (ref 8.5–10.5)
Chloride: 98 mEq/L — ABNORMAL LOW (ref 100–111)
Creatinine: 9.7 mg/dL — ABNORMAL HIGH (ref 0.6–1.0)
Glucose: 139 mg/dL — ABNORMAL HIGH (ref 70–100)
Phosphorus: 8.7 mg/dL — ABNORMAL HIGH (ref 2.3–4.7)
Potassium: 5 mEq/L (ref 3.5–5.1)
Sodium: 137 mEq/L (ref 136–145)

## 2019-07-22 LAB — CBC
Absolute NRBC: 0 10*3/uL (ref 0.00–0.00)
Absolute NRBC: 0 10*3/uL (ref 0.00–0.00)
Hematocrit: 18.8 % — ABNORMAL LOW (ref 34.7–43.7)
Hematocrit: 21.8 % — ABNORMAL LOW (ref 34.7–43.7)
Hgb: 5.7 g/dL — CL (ref 11.4–14.8)
Hgb: 6.8 g/dL — ABNORMAL LOW (ref 11.4–14.8)
MCH: 27.7 pg (ref 25.1–33.5)
MCH: 28.1 pg (ref 25.1–33.5)
MCHC: 30.3 g/dL — ABNORMAL LOW (ref 31.5–35.8)
MCHC: 31.2 g/dL — ABNORMAL LOW (ref 31.5–35.8)
MCV: 91.3 fL (ref 78.0–96.0)
MCV: 90.1 fL (ref 78.0–96.0)
MPV: 11.3 fL (ref 8.9–12.5)
MPV: 10.7 fL (ref 8.9–12.5)
Nucleated RBC: 0 /100 WBC (ref 0.0–0.0)
Nucleated RBC: 0 /100 WBC (ref 0.0–0.0)
Platelets: 159 10*3/uL (ref 142–346)
Platelets: 155 10*3/uL (ref 142–346)
RBC: 2.06 10*6/uL — ABNORMAL LOW (ref 3.90–5.10)
RBC: 2.42 10*6/uL — ABNORMAL LOW (ref 3.90–5.10)
RDW: 14 % (ref 11–15)
RDW: 14 % (ref 11–15)
WBC: 8.2 10*3/uL (ref 3.10–9.50)
WBC: 11.07 10*3/uL — ABNORMAL HIGH (ref 3.10–9.50)

## 2019-07-22 LAB — CBC AND DIFFERENTIAL
Absolute NRBC: 0 10*3/uL (ref 0.00–0.00)
Basophils Absolute Automated: 0.02 10*3/uL (ref 0.00–0.08)
Basophils Automated: 0.3 %
Eosinophils Absolute Automated: 0.16 10*3/uL (ref 0.00–0.44)
Eosinophils Automated: 2 %
Hematocrit: 19.3 % — ABNORMAL LOW (ref 34.7–43.7)
Hgb: 5.7 g/dL — CL (ref 11.4–14.8)
Immature Granulocytes Absolute: 0.04 10*3/uL (ref 0.00–0.07)
Immature Granulocytes: 0.5 %
Lymphocytes Absolute Automated: 1.17 10*3/uL (ref 0.42–3.22)
Lymphocytes Automated: 14.8 %
MCH: 27 pg (ref 25.1–33.5)
MCHC: 29.5 g/dL — ABNORMAL LOW (ref 31.5–35.8)
MCV: 91.5 fL (ref 78.0–96.0)
MPV: 11.9 fL (ref 8.9–12.5)
Monocytes Absolute Automated: 0.73 10*3/uL (ref 0.21–0.85)
Monocytes: 9.2 %
Neutrophils Absolute: 5.79 10*3/uL (ref 1.10–6.33)
Neutrophils: 73.2 %
Nucleated RBC: 0 /100 WBC (ref 0.0–0.0)
Platelets: 161 10*3/uL (ref 142–346)
RBC: 2.11 10*6/uL — ABNORMAL LOW (ref 3.90–5.10)
RDW: 14 % (ref 11–15)
WBC: 7.91 10*3/uL (ref 3.10–9.50)

## 2019-07-22 LAB — TYPE AND SCREEN
AB Screen Gel: NEGATIVE
ABO Rh: AB POS

## 2019-07-22 LAB — BASIC METABOLIC PANEL
Anion Gap: 19 — ABNORMAL HIGH (ref 5.0–15.0)
BUN: 96 mg/dL — ABNORMAL HIGH (ref 7.0–19.0)
CO2: 20 mEq/L — ABNORMAL LOW (ref 22–29)
Calcium: 8.7 mg/dL (ref 8.5–10.5)
Chloride: 96 mEq/L — ABNORMAL LOW (ref 100–111)
Creatinine: 9.5 mg/dL — ABNORMAL HIGH (ref 0.6–1.0)
Glucose: 154 mg/dL — ABNORMAL HIGH (ref 70–100)
Potassium: 5 mEq/L (ref 3.5–5.1)
Sodium: 135 mEq/L — ABNORMAL LOW (ref 136–145)

## 2019-07-22 LAB — CROSSMATCH PRBC, 1 UNIT
Expiration Date: 202107172359
Expiration Date: 202107282359
ISBT CODE: 5100
ISBT CODE: 8400
Status: TRANSFUSED
UTYPE: AB POS
UTYPE: O POS

## 2019-07-22 LAB — CORTISOL 30 MINUTE: Cortisol 30 Minutes: 21.6 ug/dL

## 2019-07-22 LAB — CORTISOL 60 MINUTE: Cortisol 60 Minutes: 25.9 ug/dL

## 2019-07-22 LAB — HEMOLYSIS INDEX
Hemolysis Index: 4 (ref 0–18)
Hemolysis Index: 9 (ref 0–18)

## 2019-07-22 LAB — CORTISOL BASELINE: Cortisol Base: 13 ug/dL

## 2019-07-22 LAB — MAGNESIUM: Magnesium: 2.3 mg/dL (ref 1.6–2.6)

## 2019-07-22 LAB — GFR
EGFR: 4.9
EGFR: 5

## 2019-07-22 LAB — MRSA CULTURE
Culture MRSA Surveillance: NEGATIVE
Culture MRSA Surveillance: NEGATIVE

## 2019-07-22 LAB — A1 ANTIGEN REFLEX: A1 Antigen: NEGATIVE

## 2019-07-22 MED ORDER — SODIUM CHLORIDE 0.9 % IV BOLUS
250.0000 mL | INTRAVENOUS | Status: AC | PRN
Start: 2019-07-22 — End: 2019-07-22

## 2019-07-22 MED ORDER — PANTOPRAZOLE SODIUM 40 MG PO TBEC
40.00 mg | DELAYED_RELEASE_TABLET | Freq: Every morning | ORAL | Status: DC
Start: 2019-07-22 — End: 2019-07-31
  Administered 2019-07-22 – 2019-07-31 (×10): 40 mg via ORAL
  Filled 2019-07-22 (×10): qty 1

## 2019-07-22 MED ORDER — ALBUMIN HUMAN 25 % IV SOLN
100.0000 mL | INTRAVENOUS | Status: AC | PRN
Start: 2019-07-22 — End: 2019-07-22
  Administered 2019-07-22: 12:00:00 100 mL via INTRAVENOUS
  Filled 2019-07-22: qty 100

## 2019-07-22 MED ORDER — VANCOMYCIN HCL IN NACL 1.5-0.9 GM/500ML-% IV SOLN
1500.00 mg | Freq: Two times a day (BID) | INTRAVENOUS | Status: DC
Start: 2019-07-22 — End: 2019-07-22
  Administered 2019-07-22: 16:00:00 1500 mg via INTRAVENOUS
  Filled 2019-07-22 (×2): qty 500

## 2019-07-22 MED ORDER — MIDODRINE HCL 5 MG PO TABS
10.0000 mg | ORAL_TABLET | Freq: Three times a day (TID) | ORAL | Status: DC
Start: 2019-07-22 — End: 2019-07-22

## 2019-07-22 MED ORDER — SODIUM CHLORIDE 0.9 % IV BOLUS
250.00 mL | Freq: Once | INTRAVENOUS | Status: DC
Start: 2019-07-22 — End: 2019-07-31

## 2019-07-22 MED ORDER — SODIUM CHLORIDE 0.9 % IV SOLN
INTRAVENOUS | Status: DC | PRN
Start: 2019-07-22 — End: 2019-07-31

## 2019-07-22 MED ORDER — COSYNTROPIN 0.25 MG IJ SOLR
0.25 mg | Freq: Once | INTRAMUSCULAR | Status: AC
Start: 2019-07-22 — End: 2019-07-22
  Administered 2019-07-22: 12:00:00 0.25 mg via INTRAVENOUS
  Filled 2019-07-22: qty 0.25

## 2019-07-22 MED ORDER — SODIUM CHLORIDE 0.9 % IV BOLUS
100.0000 mL | INTRAVENOUS | Status: AC | PRN
Start: 2019-07-22 — End: 2019-07-22

## 2019-07-22 MED ORDER — MIDODRINE HCL 5 MG PO TABS
10.0000 mg | ORAL_TABLET | Freq: Three times a day (TID) | ORAL | Status: DC
Start: 2019-07-22 — End: 2019-07-31
  Administered 2019-07-22 – 2019-07-30 (×26): 10 mg via ORAL
  Filled 2019-07-22 (×28): qty 2

## 2019-07-22 NOTE — Progress Notes (Signed)
Progress Note- Pharmacy Vancomycin Dosing Consult:  Day 2  Vancomycin Indication: Abdominal infection   Vancomycin Target Trough Level:     15-20 mg/L for severe infections       Pertinent Cultures:     Date Source  Organism & Pertinent Susceptibilities     06/27 Bcx (2/2) Day 1 NGTD   06/28  MRSA Nares & Throat  Negative    06/29  Wound cx In process           Serum creatinine: 9.7 mg/dL (H) 07/22/19 0335  Estimated creatinine clearance: 6.7 mL/min (A)    Baseline SCr: CRRT  Nephrotoxic drugs administered in the past 48 hours: None    Assessment  Measured Vancomycin Level: trough ordered on 06/30 @ 1300.          Recommendations:  Maintenance regimen (~15 mg/kg) = 1500 mg Q 12 hr  New Vancomycin level: Trough ordered prior to 3rd  dose on 06/30 @ 1300.     Thank you,  Ronith Berti

## 2019-07-22 NOTE — Progress Notes (Signed)
HD complete no fluid off. Pt received 1 unit of PRBCS with no adverse reaction. Albumin 25% given to support BP. R CVC WDL. Pt AOX3 she had 100% of lunch. Report given to the primary nurse Willodean Rosenthal RN.         07/22/19 1545   Treatment Summary   Time Off Machine 1520   Duration of Treatment (Hours) 3   Treatment Type 1:1   Dialyzer Clearance Moderately streaked   Fluid Volume Off (mL) 550   Prime Volume (mL) 150   Rinseback Volume (mL) 150   Fluid Given: Normal Saline (mL) 100   Fluid Given: PRBC  50 mL   Fluid Given: Albumin (mL) 100   Fluid Given: Other (mL) 0   Total Fluid Given 550   Hemodialysis Net Fluid Removed 0   Post Treatment Assessment   Post-Treatment Weight (Kg) 1076   Patient Response to Treatment tolerated well   Information for Next Treatment TBD   Additional Dialyzer Used 0   Permacath Catheter - Tunneled Right   No Placement Date or Time found.   Present on Admission?: Yes  Orientation: Right  Central Line Infection Prevention Education provided?: Yes   Dressing Status Clean;Dry;Intact   Dressing/Line Intervention Dressing reinforced   Dressing Change Assistant ([facility] Only) n/a   Line Used For Blood Draw N/A   Dressing Change Due 07/23/19   Vitals   Temp 98.1 F (36.7 C)   Heart Rate 71   Resp Rate (!) 30   BP 104/69   SpO2 98 %   O2 Device Nasal cannula   Pain Assessment   Charting Type Reassessment   Pain Scale Used Numeric Scale (0-10)   Numeric Pain Scale   Pain Score 0   POSS Score 2   Pain Location Arm   Pain Orientation Right   Pain Descriptors Aching;Cramping   Pain Frequency Increases with movement   Effect of Pain on Daily Activities moderate   Patient's Stated Comfort Functional Goal 2   Pain Intervention(s) Rest   Multiple Pain Sites No   Education   Person taught Patient   Knowledge basis Substantial   Topics taught Access care;Procedure   Teaching Tools Explain   [name] Understanding   Bedside Nurse Communication   Name of bedside RN - post dialysis Willodean Rosenthal RN

## 2019-07-22 NOTE — Progress Notes (Signed)
INFECTIOUS DISEASES PROGRESS NOTE    Date Time: 07/22/19 9:17 AM  Patient Name: [name],[name]  Patient status.Inpatient  Hospital Day: 1    Assessment and Plan:   1. Hypotension  Profound anemia  No evidence for SIRS  2. Abd wound with MRSA, enterococcus, and pseudomonas    PLAN: 1. Check wound C&S  2. Cont mero,vanco. If there should treat peritonitis  Subjective:   Non toxic    Review of Systems:   Review of Systems - Abdominal tenderness  Chest pain, SOB  Dizziness    Antibiotics:   Day 2    Other medications reviewed in EPIC.  Central Access:       Physical Exam:     Vitals:    07/22/19 0800   BP:    Pulse:    Resp:    Temp: 98.4 F (36.9 C)   SpO2:        General appearance - in mild to moderate distress  Chest - clear, slightly decreased on left  Heart - normal rate, regular rhythm, normal S1, S2, no murmurs, rubs, clicks or gallops  Wound has green base  Labs:     Microbiology Results     Procedure Component Value Units Date/Time    Blood Culture Aerobic/Anaerobic #1 [[id number]] Collected: 07/20/19 2052    Specimen: Arm from Blood, Venipuncture Updated: 07/22/19 0221    Narrative:      ORDER#: [id number]                                    ORDERED BY: Randolm Idol  SOURCE: Blood, Venipuncture arm                      COLLECTED:  07/20/19 20:52  ANTIBIOTICS AT COLL.:                                RECEIVED :  07/20/19 21:25  Culture Blood Aerobic and Anaerobic        PRELIM      07/22/19 02:21  07/22/19   No Growth after 1 day/s of incubation.      Blood Culture Aerobic/Anaerobic #2 [[id number]] Collected: 07/20/19 2052    Specimen: Arm from Blood, Venipuncture Updated: 07/22/19 0221    Narrative:      ORDER#: [id number]                                    ORDERED BY: Randolm Idol  SOURCE: Blood, Venipuncture arm                      COLLECTED:  07/20/19 20:52  ANTIBIOTICS AT COLL.:                                RECEIVED :  07/20/19 21:25  Culture Blood Aerobic and Anaerobic        PRELIM      07/22/19  02:21  07/22/19   No Growth after 1 day/s of incubation.      COVID-19 (SARS-COV-2) [facility] Rapid) [[id number]] Collected: 07/21/19 0152    Specimen: Nasopharyngeal Swab from Nasopharynx Updated: 07/21/19 0324     Purpose of COVID testing Screening  SARS-CoV-2 Specimen Source Nasopharyngeal     SARS CoV 2 Overall Result Negative     Comment: Test performed using the Abbott ID NOW EUA assay.  Please see Fact Sheets for patients and providers located at:  http://olson-hall.info/    This test is for the qualitative detection of SARS-CoV-2  (COVID19) nucleic acid. Viral nucleic acids may persist in vivo,  independent of viability. Detection of viral nucleic acid does  not imply the presence of infectious virus, or that virus  nucleic acid is the cause of clinical symptoms. Negative  results should be treated as presumptive and, if inconsistent  with clinical signs and symptoms or necessary for patient  management, should be tested with an alternative molecular  assay. Negative results do not preclude SARS-CoV-2 infection  and should not be used as the sole basis for patient  management decisions. Invalid results may be due to inhibiting  substances in the specimen and recollection should occur.         Narrative:      o Collect and clearly label specimen type:  o Upper respiratory specimen: One Nasopharyngeal Dry Swab NO  Transport Media.  o Hand deliver to laboratory ASAP  Indication for testing->Extended care facility admission to  semi private room  Screening    MRSA culture - Nares [[id number]] Collected: 07/21/19 1058    Specimen: Culturette from Nares Updated: 07/21/19 1511    MRSA culture - Throat [[id number]] Collected: 07/21/19 1058    Specimen: Culturette from Throat Updated: 07/21/19 1511          CBC w/Diff CMP   Recent Labs   Lab 07/22/19  0848 07/22/19  0411 07/21/19  0858 07/20/19  2052 07/20/19  2052   WBC 8.20 7.91 10.79*  More results in Results Review 9.77*   Hgb 5.7* 5.7* 7.6*  More  results in Results Review 7.4*   Hematocrit 18.8* 19.3* 25.5*  More results in Results Review 25.0*   Platelets 159 161 175  More results in Results Review 171   MCV 91.3 91.5 91.4  More results in Results Review 91.2   Neutrophils  --  73.2 73.1  --  70.6   More results in Results Review = values in this interval not displayed.       PT/INR         Recent Labs   Lab 07/22/19  0335 07/22/19  0031 07/21/19  2003 07/21/19  3086 07/20/19  2052 07/18/19  0335 07/18/19  0335 07/17/19  0420 07/17/19  0420   Sodium 137 135* 137  More results in Results Review 133*  More results in Results Review 136  More results in Results Review 134*   Potassium 5.0 5.0 5.0  More results in Results Review 6.2*  More results in Results Review 5.3*  More results in Results Review 5.3*   Chloride 98* 96* 97*  More results in Results Review 95*  More results in Results Review 98*  More results in Results Review 96*   CO2 21* 20* 20*  More results in Results Review 23  More results in Results Review 23  More results in Results Review 23   BUN 96.0* 96.0* 94.0*  More results in Results Review 91.0*  More results in Results Review 73.0*  More results in Results Review 77.0*   Creatinine 9.7* 9.5* 9.4*  More results in Results Review 9.1*  More results in Results Review 7.3*  More results in Results Review 7.3*   Glucose 139*  154* 164*  More results in Results Review 212*  More results in Results Review 141*  More results in Results Review 229*   Calcium 8.1* 8.7 8.5  More results in Results Review 8.9  More results in Results Review 8.8  More results in Results Review 8.4*   Magnesium 2.3  --   --   --   --   --  2.1  --  2.0   Phosphorus 8.7*  --   --   --   --   --  5.5*  --  5.2*   Protein, Total  --   --   --   --  7.5  --   --   --   --    Albumin 3.3*  --   --   --  2.8*  --  2.5*  More results in Results Review 2.6*   AST (SGOT)  --   --   --   --  10  --   --   --   --    ALT  --   --   --   --  11  --   --   --   --    Alkaline  Phosphatase  --   --   --   --  109*  --   --   --   --    Bilirubin, Total  --   --   --   --  0.3  --   --   --   --    More results in Results Review = values in this interval not displayed.      Glucose POCT   Recent Labs   Lab 07/22/19  0335 07/22/19  0031 07/21/19  2003 07/21/19  1250 07/21/19  0858 07/20/19  2052 07/18/19  0335   Glucose 139* 154* 164* 213* 210* 212* 141*          Rads:     Radiology Results (24 Hour)     Procedure Component Value Units Date/Time    XR Chest AP Portable [[id number]] Collected: 07/22/19 0756    Order Status: Completed Updated: 07/22/19 0800    Narrative:      XR CHEST AP PORTABLE    CLINICAL INDICATION:   shortness of breath    COMPARISON: 07/20/2019      Impression:           There is a prominent appearance of the cardiac silhouette. Mediastinal  silhouette appears within normal size limits for portable technique and  patient positioning. Central venous catheter does not appear  significantly changed. Pulmonary vascular congestion does not appear  significantly changed. There is mild persistent blunting of the left  costophrenic angle. There is no evidence for pneumothorax.     Edison Simon, MD   07/22/2019 7:58 AM            Signed by: Ernst Breach

## 2019-07-22 NOTE — Progress Notes (Signed)
Patient admitted with chest pain and abdominal wound  Patient has a history of ESRD with Tues, Thurs, and Saturday dialysis. PMHX includes DM II, CVA, Sleep Apnea, COPD, Hypertension  Patient is alert and oriented and confirmation her nursing home address, and emergency contact. Patient resides at the [facility] in [place] where she receives assistance with ADLs. Patient's daughter takes care of her IADLs.   Patient is willing to return to the [facility] when discharged via Medicaid transport.   07/22/19 1050   Patient Type   Within 30 Days of Previous Admission? Yes   Healthcare Decisions   Interviewed: Patient   Orientation/Decision Making Abilities of Patient Alert and Oriented x3, able to make decisions   Advance Directive Patient does not have advance directive   Healthcare Agent Appointed Other (Comment)  (PATIENT UNSURE)   Prior to admission   Prior level of function Needs assistance with ADLs   Type of Residence Nursing home  ([place])   Have running water, electricity, heat, etc? Yes   Living Arrangements Other (Comment)  [facility])   How do you get to your MD appointments? DR Inman   How do you get your groceries? N/A   Who fixes your meals? NURSING HOME   Who does your laundry? NURSING HOME   Who picks up your prescriptions? DAUGHTER   Dressing Needs assistance   Grooming Needs assistance   Feeding Independent   Bathing Needs assistance   Toileting Independent   DME Currently at Home ADL- Engineer, structural, [facility];Skilled Nursing   Type of Dialysis: Hemodialysis   How is patient transported to dialysis? Other  [facility])   What is weekly dialysis schedule? Tue-Thu-Sat   Date of Last Dialysis: 07/19/19   Prior SNF admission? (Detail) YES   Prior Rehab admission? (Detail) YES   Discharge Planning   Support Systems Case manager/social worker;Other (Comment);Children  (NURSING HOME CASE MANAGER)   Mode of transportation:  Medicaid transport   Does the patient have perscription coverage? Yes   Consults/Providers   Correct PCP listed in Epic? No (comment)  (PATIENT STATES SHE IS YET TO GET A [employer] DR)   Family and PCP   PCP on file was verified as the current PCP? PCP Unknown/Unable to obtain   In case you are admitted, would like family notified? Yes   Name of family member to be notified [name]   In case you are admitted, would like your PCP notified? Unknown   Important Message from Medicare Notice   Patient received 1st IMM Letter? No

## 2019-07-22 NOTE — Progress Notes (Signed)
[place]     Date Time: 07/22/19 9:48 AM  Patient Name: [name],[name]     Patient Active Problem List   Diagnosis    Iron deficiency anemia secondary to inadequate dietary iron intake    Sideropenic dysphagia    Peritoneal dialysis catheter dysfunction, initial encounter    Peritonitis    Upper GI bleed    ESRD (end stage renal disease) on dialysis    Generalized abdominal pain    Thrombocytopenia    Hypotension    Type 2 diabetes mellitus, with long-term current use of insulin    GERD (gastroesophageal reflux disease)    Bowel incontinence    Right sided numbness    Uncontrolled type 2 diabetes mellitus with hyperglycemia    Hypertension    Hyperlipidemia    History of COPD    Gastritis with hemorrhage    History of anemia due to chronic kidney disease    Open wound, abdominal wall, lateral, subsequent encounter    Gout    Dizziness    Fall    History of CVA (cerebrovascular accident)    Shortness of breath     Assessment:   Admitted with chest pain and hypotension dizziness hypotension most likely either related to volume or sepsis  Acute on chronic anemia  Pericardial effusion echocardiogram not consistent with tamponade  Preserved left ventricular function  Type 2 diabetes  Renal failure on peritoneal dialysis   Recent cellulitis  Anemia  VQ scan low probability for pulmonary embolism      Recommendations:      Continue supportive care   Volume management per dialysis   Fu limited echo later this week         Subjective:   Denies chest pain, SOB or palpitations.    Medications:      Scheduled Meds: PRN Meds:    atorvastatin, 40 mg, Oral, Daily  collagenase, , Topical, Q24H SCH  cosyntropin, 0.25 mg, Intravenous, Once  gabapentin, 300 mg, Oral, Daily  insulin lispro, 1-6 Units, Subcutaneous, QHS  insulin lispro, 2-10 Units, Subcutaneous, TID AC  lidocaine, 1 patch, Transdermal, Q24H SCH  meropenem, 500 mg, Intravenous, Q8H  midodrine, 10  mg, Oral, TID MEALS  pantoprazole, 40 mg, Oral, QAM AC  sevelamer, 800 mg, Oral, TID MEALS  sodium chloride, 250 mL, Intravenous, Once  vancomycin, , Intravenous, See Admin Instructions        Continuous Infusions:   calcium GLUConate CRRT Infusion      norepinephrine (LEVOPHED) infusion      prismaSATE 4/2.5      prismaSATE 4/2.5      sodium chloride, , PRN  acetaminophen, 650 mg, Q6H PRN   Or  acetaminophen, 650 mg, Q6H PRN  albumin human, 100 mL, PRN  dextrose, 15 g of glucose, PRN   And  dextrose, 12.5 g, PRN   And  glucagon (rDNA), 1 mg, PRN  magnesium sulfate, 1 g, PRN  melatonin, 3 mg, QHS PRN  naloxone, 0.2 mg, PRN  ondansetron, 4 mg, Q6H PRN   Or  ondansetron, 4 mg, Q6H PRN  oxyCODONE, 10 mg, Q3H PRN  potassium chloride, 10 mEq, PRN  potassium chloride, 20 mEq, PRN  sodium chloride (PF), 10 mL, PRN  sodium chloride, 100 mL, Q1H PRN  sodium chloride, 250 mL, PRN  sodium phosphate IVPB, 20 mmol, PRN  sodium phosphate IVPB, 30 mmol, PRN          Home Meds:  Medications Prior to Admission   Medication Sig Dispense Refill Last Dose    amLODIPine (NORVASC) 5 MG tablet Take 1 tablet (5 mg total) by mouth every 12 (twelve) hours       atorvastatin (LIPITOR) 40 MG tablet Take 40 mg by mouth daily       carvedilol (COREG) 25 MG tablet Take 1 tablet (25 mg total) by mouth every 12 (twelve) hours       collagenase (SANTYL) ointment Apply topically every 24 hours 90 g 0     Dulaglutide (Trulicity) 4.56 YB/6.3SL Solution Pen-injector Inject 0.75 mg into the skin once a week       famotidine (PEPCID) 40 MG tablet Take 40 mg by mouth daily       gabapentin (NEURONTIN) 300 MG capsule Take 1 capsule (300 mg total) by mouth daily 30 capsule 0     insulin aspart (NovoLOG) 100 UNIT/ML injection Inject 5 Units into the skin 3 (three) times daily before meals (Patient taking differently: Inject 15 Units into the skin 3 (three) times daily before meals   )       insulin glargine (LANTUS) 100 UNIT/ML injection Inject  30 Units into the skin every 12 (twelve) hours (Patient taking differently: Inject 35 Units into the skin nightly   ) 1 pen 0     lactobacillus/streptococcus (RISAQUAD) Cap Take 1 capsule by mouth daily 30 capsule 0     lidocaine (LIDODERM) 5 % Place 1 patch onto the skin every 24 hours Remove & Discard patch within 12 hours or as directed by MD 30 each 0     nystatin (NYSTOP) powder Apply 1 application topically 2 (two) times daily 30 g 0     oxyCODONE (ROXICODONE) 5 MG immediate release tablet Take 1 tablet (5 mg total) by mouth every 6 (six) hours as needed for Pain 10 tablet 0     pantoprazole (PROTONIX) 40 MG tablet Take 1 tablet (40 mg total) by mouth 2 (two) times daily       sevelamer (RENVELA) 800 MG tablet Take 800 mg by mouth 3 (three) times daily with meals       sodium hypochlorite (DAKIN'S QUARTER STRENGTH) 0.125 % Solution Irrigate with as directed daily 1 Bottle 0     vitamin C (ASCORBIC ACID) 500 MG tablet Take 1 tablet (500 mg total) by mouth daily 30 tablet 0     zinc Oxide (DESITIN) 40 % Paste paste Apply topically every 6 (six) hours       zinc sulfate (ZINCATE) 220 (50 Zn) MG capsule Take 1 capsule (220 mg total) by mouth daily 30 capsule 0           Physical Exam:   BP 99/57    Pulse 71    Temp 98.4 F (36.9 C) (Oral)    Resp 18    Ht 1.575 m (5\' 2" )    Wt 107.2 kg (236 lb 5.3 oz)    LMP  (LMP Unknown) Comment: post menopausal   SpO2 99%    BMI 43.23 kg/m  O2 Flow Rate (Young/min): 2 Young/min O2 Device: Nasal cannula    Temp (24hrs), Avg:97.9 F (36.6 C), Min:97.2 F (36.2 C), Max:98.4 F (36.9 C)      Telemetry reviewed      Intake and Output Summary (Last 24 hours) at Date Time    Intake/Output Summary (Last 24 hours) at 07/22/2019 0948  Last data filed at 07/22/2019 0035  Gross per 24 hour  Intake 2325 ml   Output --   Net 2325 ml       General Appearance:  Breathing comfortable, no acute distress  Head:  normocephalic  Eyes:  EOM's intact  Neck:  No carotid bruit or jugular venous  distension, brisk carotid upstroke  Lungs:  Clear to auscultation throughout, no wheezes, rhonchi or rales, good respiratory effort   Chest Wall:  No tenderness or deformity  Heart:  S1 S2 normal No S3 or S4, without murmur/rub.  PMI non displaced   Abdomen:  Soft, non-tender, positive bowel sounds, no hepatojugular reflux  Extremities:  No cyanosis, clubbing or edema  Pulses:  2+ pedal, radial, brachial pulses equal bilateraly  Neurologic:  Alert and oriented x3, mood and affect normal  Musculoskeletal: normal strength and tone    Labs:     Recent Labs   Lab 07/21/19  1058 07/21/19  0858 07/20/19  2052   Troponin I 0.01 <0.01 <0.01     Recent Labs   Lab 07/22/19  0335 07/20/19  2052 07/20/19  2052   Bilirubin, Total  --   --  0.3   Protein, Total  --   --  7.5   Albumin 3.3*  More results in Results Review 2.8*   ALT  --   --  11   AST (SGOT)  --   --  10   More results in Results Review = values in this interval not displayed.     Recent Labs   Lab 07/22/19  0335   Magnesium 2.3         Recent Labs   Lab 07/22/19  0848 07/22/19  0411 07/21/19  0858   WBC 8.20 7.91 10.79*   Hgb 5.7* 5.7* 7.6*   Hematocrit 18.8* 19.3* 25.5*   Platelets 159 161 175     Recent Labs   Lab 07/22/19  0335 07/22/19  0031 07/21/19  2003   Sodium 137 135* 137   Potassium 5.0 5.0 5.0   Chloride 98* 96* 97*   CO2 21* 20* 20*   BUN 96.0* 96.0* 94.0*   Creatinine 9.7* 9.5* 9.4*   EGFR 4.9 5.0 5.1   Glucose 139* 154* 164*   Calcium 8.1* 8.7 8.5       Lab Results   Component Value Date    BNP 277 (H) 07/20/2019       Weight Monitoring 07/15/2019 07/16/2019 07/18/2019 07/20/2019 07/21/2019 07/21/2019 07/22/2019   Height - - - 157.5 cm - - -   Height Method - - - - - - -   Weight 109 kg 106.9 kg 107.6 kg 107.6 kg 107.321 kg 104.8 kg 107.2 kg   Weight Method Bed Scale Bed Scale Bed Scale - Standing Scale - Bed Scale   BMI (calculated) - - - 43.5 kg/m2 - - -         Imaging:   Radiological Procedure            Signed by: Tarri Glenn, MD      [place]  NP [place] (8am-5pm)  MD Spectralink [phone] (8am-5pm)  After hours, non urgent consult line [phone]  After Hours, urgent consults or to reach the on-cal MD [phone]

## 2019-07-22 NOTE — Progress Notes (Signed)
[facility] PROGRESS NOTE  Aaron Edelman, x [phone] [facility] Spectralink)      Date Time: 07/22/19 11:12 AM  Patient Name: [name]  Attending Physician: Quillian Quince, MD    CC: follow-up ESRD, provision of hemodialysis    Assessment:     1. ESRD on HD MWF at [facility], Dr. Tobey Grim  - Durward Parcel on PD  2. Hyperkalemia  3. Hypotension  - no evid of tamponade  4. Anemia in CKD  5. Mild metabolic acidosis    Recommendations:     1. HD today as ordered, no UF today  2. Plan of next HD on Thursday  3. To be transfused during HD    Case discussed with: patient, HD RN, ICU RN    Verl Blalock, MD  [facility]  [phone] (office)  X [phone] [facility] Spectra-Link)      Subjective: continues to report pain with breathing    Review of Systems:   Cardio: chest pain, no edema  Pulm: no SOB  GI: no abdominal pain      Physical Exam:     Vitals:    07/22/19 0630 07/22/19 0700 07/22/19 0730 07/22/19 0800   BP: 109/56 90/50 99/57     Pulse: 71 70 71    Resp: 17 12 18     Temp:    98.4 F (36.9 C)   TempSrc:    Oral   SpO2: 98% 99% 99%    Weight:       Height:           Intake and Output Summary (Last 24 hours) at Date Time    Intake/Output Summary (Last 24 hours) at 07/22/2019 1112  Last data filed at 07/22/2019 0035  Gross per 24 hour   Intake 2325 ml   Output --   Net 2325 ml       General: awake, alert, oriented x 3, no acute distress.  Cardiovascular: regular rate and rhythm  Lungs: clear to auscultation bilaterally, bilateral air entry, normal work of breathing  Abdomen: soft, NT, ND  Extremities: no edema    Access:  R IJ HDTC    Meds:      Scheduled Meds: PRN Meds:    atorvastatin, 40 mg, Oral, Daily  collagenase, , Topical, Q24H SCH  cosyntropin, 0.25 mg, Intravenous, Once  gabapentin, 300 mg, Oral, Daily  insulin lispro, 1-6 Units, Subcutaneous, QHS  insulin lispro, 2-10 Units, Subcutaneous, TID AC  lidocaine, 1 patch, Transdermal, Q24H SCH  meropenem, 500 mg, Intravenous, Q8H  midodrine, 10 mg,  Oral, TID MEALS  pantoprazole, 40 mg, Oral, QAM AC  sevelamer, 800 mg, Oral, TID MEALS  sodium chloride, 250 mL, Intravenous, Once  vancomycin, , Intravenous, See Admin Instructions          Continuous Infusions:   calcium GLUConate CRRT Infusion      norepinephrine (LEVOPHED) infusion      prismaSATE 4/2.5      prismaSATE 4/2.5      sodium chloride, , PRN  acetaminophen, 650 mg, Q6H PRN   Or  acetaminophen, 650 mg, Q6H PRN  albumin human, 100 mL, PRN  dextrose, 15 g of glucose, PRN   And  dextrose, 12.5 g, PRN   And  glucagon (rDNA), 1 mg, PRN  magnesium sulfate, 1 g, PRN  melatonin, 3 mg, QHS PRN  naloxone, 0.2 mg, PRN  ondansetron, 4 mg, Q6H PRN   Or  ondansetron, 4 mg, Q6H PRN  oxyCODONE, 10 mg, Q3H PRN  potassium chloride, 10 mEq, PRN  potassium chloride, 20 mEq, PRN  sodium chloride (PF), 10 mL, PRN  sodium chloride, 100 mL, Q1H PRN  sodium chloride, 250 mL, PRN  sodium phosphate IVPB, 20 mmol, PRN  sodium phosphate IVPB, 30 mmol, PRN              Labs:     Recent Labs   Lab 07/22/19  0848 07/22/19  0411 07/21/19  0858   WBC 8.20 7.91 10.79*   Hgb 5.7* 5.7* 7.6*   Hematocrit 18.8* 19.3* 25.5*   Platelets 159 161 175     Recent Labs   Lab 07/22/19  0335 07/22/19  0031 07/21/19  2003 07/21/19  0858 07/20/19  2052 07/18/19  0335 07/18/19  0335 07/17/19  0420 07/17/19  0420   Sodium 137 135* 137  More results in Results Review 133*  More results in Results Review 136  More results in Results Review 134*   Potassium 5.0 5.0 5.0  More results in Results Review 6.2*  More results in Results Review 5.3*  More results in Results Review 5.3*   Chloride 98* 96* 97*  More results in Results Review 95*  More results in Results Review 98*  More results in Results Review 96*   CO2 21* 20* 20*  More results in Results Review 23  More results in Results Review 23  More results in Results Review 23   BUN 96.0* 96.0* 94.0*  More results in Results Review 91.0*  More results in Results Review 73.0*  More results in Results  Review 77.0*   Creatinine 9.7* 9.5* 9.4*  More results in Results Review 9.1*  More results in Results Review 7.3*  More results in Results Review 7.3*   Calcium 8.1* 8.7 8.5  More results in Results Review 8.9  More results in Results Review 8.8  More results in Results Review 8.4*   Albumin 3.3*  --   --   --  2.8*  --  2.5*  More results in Results Review 2.6*   Phosphorus 8.7*  --   --   --   --   --  5.5*  --  5.2*   Magnesium 2.3  --   --   --   --   --  2.1  --  2.0   Glucose 139* 154* 164*  More results in Results Review 212*  More results in Results Review 141*  More results in Results Review 229*   EGFR 4.9 5.0 5.1  More results in Results Review 5.3  More results in Results Review 6.8  More results in Results Review 6.8   More results in Results Review = values in this interval not displayed.     : XR Chest AP Portable    Result Date: 07/22/2019   There is a prominent appearance of the cardiac silhouette. Mediastinal silhouette appears within normal size limits for portable technique and patient positioning. Central venous catheter does not appear significantly changed. Pulmonary vascular congestion does not appear significantly changed. There is mild persistent blunting of the left costophrenic angle. There is no evidence for pneumothorax. Edison Simon, MD  07/22/2019 7:58 AM          Signed by: Verl Blalock, MD

## 2019-07-22 NOTE — Treatment Plan (Cosign Needed)
Brief summary of why patient was admitted and any issues in the past 24 hours (1 min max). After the brief summary, then proceed to A FASTHUGS BID.   Activity   []  Y / [x]  N  PMP Activity: Step 3 - Bed Mobility (07/22/2019  4:00 AM)      Shift target level:    PT/OT Consult: []  Y / [x]  N   Mobility level:   PMP Contraindicated  Step 1 - Bedrest, Step 2 - Supine Exercise, Step 3- Bed Mobility, Step 4 - Dangle at Bedside,  Step 5 - Chair, Step 6 - Walks in Room,   Step - 7 Walks out of room   Feeding/Fluids     []  Y    /   [x]  N  Supervise For Meals Frequency: All meals  Diet NPO effective now     Intake/Output Summary (Last 24 hours) at 07/22/2019 0529  Last data filed at 07/22/2019 0035  Gross per 24 hour   Intake 2825 ml   Output --   Net 2825 ml       Diet ordered?  NPO?  Tube feedings initiated?  What kind? What rate?  Can they be advanced?  Need free water?  What IV fluids are running?  Is the I&O unbalanced? Mean arterial pressure >65? If patient receiving any vassopresor equivalent dose of less than 12.5 mcg/min  (Malnutrition leads to worse outcomes)       Analgesia  Not use for sedation     [x]  Y    /    []  N    /   []  NA    Pain/CPOT Score:   Pain Score: 0-No pain (Pain)    (CPOT)  Oxycontin Is there adequate pain relief ordered?  Is there a pain plan of care? Can opioids be reduced, and/or replaced with non-opioid options?   (Pain affects psychological and physiological recovery)   Sedation/  Paralytic?   []  Y    /    []  N     /   [x]  NA  []  Y    /    []  N     /   [x]  NA    Target RASS Score:  Patient Actual RASS Score:   RASS Score: Drowsy    CAM-ICU: Delirium Present? []  Y    /    [x]  N  IF CAM Positive - use the acronym THINK  Positive or Negative for Delirium?: Negative  SAT Performed: []  Y    /    [x]  N  /   []  NA   Are daily sedation vacations performed?  Is RASS 0 to -2 being maintained (calm, comfortable, collaborative)?  (Over sedation is correlated with harmful effects)  THINK - Toxic situations  and medications, Hypoxemia, Infection/Sepsis, inflammation, immobilization, Non-pharmacological interventions, K+ or other electrolyte interventions     Thromboprophylaxis   [x]  Y    /    []  N    Last Anticoagulant Admin          heparin (porcine) injection 5,000 Units    Given 5,000 Units at 07/21/19 0910    Frequency: Every 12 hours scheduled            Is lovenox or heparin in use to avoid DVT?  Are the coags abnormal?  Is anticoagulation contraindicated?  Why?  (DVT leads to increased mortality and morbidity)         HAPI Prevention     [x]  Y    /   []   N    Braden score:   Braden Scale Score: 13 Braden score? Does the patient need a specialty bed?  Are prevention methods in place? Are skin checks being done under devices?  Are pictures taken of existing HAPIs?  (Prevention is the goal; HAPIs increase LOS and costs)     Ulcer Prevention [x]  Y    /  []  N  If indicated *ONLY INDICATED IF coagulopathy, mechanical ventilation for >48 hours, traumatic brain injury, history of GI ulceration or bleeding within past year, extensive burns   Glucose Control     [x]  Y    /  []   N  Recent Labs     07/22/19  0335 07/22/19  0031 07/21/19  2135 07/21/19  2003 07/21/19  1548 07/21/19  1250 07/21/19  1132 07/21/19  0955 07/21/19  0858   Glucose 139* 154*  --  164*  --  213*  --   --    < >   Whole Blood Glucose POCT  --   --  161*  --  181*  --  235* 204*  --     < > = values in this interval not displayed.      Are blood sugars under control (<180)?  Need insulin? Need long acting insulin if only sliding scale is ordered?  (Controlled glucose leads to shorter ICU stays) Any medications that can be adjusted? i.e. Steroids?    Spontaneous     Breathing Trial []  Y    /   [x]  N    /   []  NA    Did the patient pass the SAT today? Are they ready for CPAP? Is their sedation at a minimum?  Current vent settings? Vital signs and hemodynamics?  (Delayed weaning leads to complications, VAP, increased LOS)     Respiratory Status O2 Device:    O2 Device: Nasal cannula  Current Settings:     If COVID +, patient able to self-prone: []  Y /  [x]  N    Incentive Spirometer Volume:        Bowel Care   [x]  Y    /   []  N Bowel sounds? Passing flatus, BMs?  Distention present?  Need C.Diff culture?  Laxative? Vomiting?   (Diarrhea leads to electrolyte imbalance, dehydration, skin breakdown, delirium;  Constipation leads to discomfort, feeding intolerance, delirium)   Indwelling Catheter    Removal  Foley?  Central Line? []  Y    /  [x]  N     /   []  NA                 Vasopressors? N/A Can Foley be removed? Can we use Purewick or condom cath?    Can Central Line be removed?  Can Arterial Line be removed?  Do we need a PICC?  (Longer dwell time increase risk of infection, discomfort, and reduces mobility)     De-escalation of      Antibiotics   [x]  Y    /   []  N     /   []  NA  Merem, Vanc What antibiotics are in use?  What are we treating? What culture/sensitivity results are available?   (Unnecessary antibiotic use promotes development of resistance   Miscellaneous Restraints []  Y / [x]  N  Palliative Care []  Y / [x]  N  Transfer or Discharge: []  Y / [x]  N  WRTC Trigger: []  Y / [x]  N  Family Conference: []  Y / [x]  N  Core Measures AMI:  [] ASA [] Beta-Blocker  CHF: [] EF% [] ACE or ARB  Stroke: [] PT/OT [] Swallow Screen   PNA: [] Immunization [] Bld culture w/in 24 hrs        Goals for today   Possible HD today?  Monitor H/H  Diet order?   Brief summary of patient goals that everyone agreed upon.    Participants Physician: []  Y / []  N  Nursing:[]  Y / []  N  Pharmacy: []  Y / []  N  Nutrition: []  Y / []  N  Case Management: []  Y / []  N  Respiratory: []  Y / []  N  Ethics:[]  Y / []  N  PT/OT: []  Y / []  N

## 2019-07-22 NOTE — Plan of Care (Signed)
Patient tolerated HD well. Remained off of vasopressors SBPs between 100 and 120. Pt AAOx4.      Problem: Moderate/High Fall Risk Score >5  Goal: Patient will remain free of falls  Outcome: Progressing  Flowsheets (Taken 07/22/2019 1849)  High (Greater than 13):   HIGH-Consider use of low bed   HIGH-Pharmacy to initiate evaluation and intervention per protocol   HIGH-Utilize chair pad alarm for patient while in the chair   HIGH-Visual cue at entrance to patient's room   HIGH-Apply yellow "Fall Risk" arm band   HIGH-Initiate use of floor mats as appropriate  VH High Risk (Greater than 13): Keep door open for better visibility     Problem: Safety  Goal: Patient will be free from injury during hospitalization  Outcome: Progressing  Flowsheets (Taken 07/22/2019 1849)  Patient will be free from injury during hospitalization:   Assess patient's risk for falls and implement fall prevention plan of care per policy   Provide and maintain safe environment   Include patient/ family/ care giver in decisions related to safety   Use appropriate transfer methods   Ensure appropriate safety devices are available at the bedside   [place]ourly rounding   Assess for patients risk for elopement and implement [place] per policy   Provide alternative method of communication if needed (communication boards, writing)  Goal: Patient will be free from infection during hospitalization  Outcome: Progressing  Flowsheets (Taken 07/22/2019 1849)  Free from Infection during hospitalization:   Assess and monitor for signs and symptoms of infection   Monitor lab/diagnostic results   Monitor all insertion sites (i.e. indwelling lines, tubes, urinary catheters, and drains)   Encourage patient and family to use good hand hygiene technique     Problem: Pain  Goal: Pain at adequate level as identified by patient  Outcome: Progressing  Flowsheets (Taken 07/22/2019 1849)  Pain at adequate level as identified by patient:   Identify patient comfort  function goal   Assess for risk of opioid induced respiratory depression, including snoring/sleep apnea. Alert healthcare team of risk factors identified.   Reassess pain within 30-60 minutes of any procedure/intervention, per Pain Assessment, Intervention, Reassessment (AIR) Cycle   Assess pain on admission, during daily assessment and/or before any "as needed" intervention(s)   Evaluate patient's satisfaction with pain management progress   Evaluate if patient comfort function goal is met   Offer non-pharmacological pain management interventions   Consult/collaborate with Pain Service   Consult/collaborate with Physical Therapy, Occupational Therapy, and/or Speech Therapy     Problem: Side Effects from Pain Analgesia  Goal: Patient will experience minimal side effects of analgesic therapy  Outcome: Progressing  Flowsheets (Taken 07/22/2019 1849)  Patient will experience minimal side effects of analgesic therapy:   Monitor/assess patient's respiratory status (RR depth, effort, breath sounds)   Assess for changes in cognitive function   Prevent/manage side effects per LIP orders (i.e. nausea, vomiting, pruritus, constipation, urinary retention, etc.)   Evaluate for opioid-induced sedation with appropriate assessment tool (i.e. POSS)     Problem: Discharge Barriers  Goal: Patient will be discharged home or other facility with appropriate resources  Outcome: Progressing  Flowsheets (Taken 07/22/2019 1849)  Discharge to home or other facility with appropriate resources:   Provide appropriate patient education   Provide information on available health resources   Initiate discharge planning     Problem: Psychosocial and Spiritual Needs  Goal: Demonstrates ability to cope with hospitalization/illness  Outcome: Progressing  Flowsheets (Taken 07/22/2019  1849)  Demonstrates ability to cope with hospitalizations/illness:   Encourage verbalization of feelings/concerns/expectations   Provide quiet environment   Assist patient to  identify own strengths and abilities   Encourage patient to set small goals for self   Encourage participation in diversional activity   Reinforce positive adaptation of new coping behaviors   Include patient/ patient care companion in decisions   Communicate referral to spiritual care as appropriate     Problem: Compromised Tissue integrity  Goal: Damaged tissue is healing and protected  Outcome: Progressing  Flowsheets (Taken 07/22/2019 1849)  Damaged tissue is healing and protected:   Monitor/assess Braden scale every shift   Provide wound care per wound care algorithm   Reposition patient every 2 hours and as needed unless able to reposition self   Increase activity as tolerated/progressive mobility   Relieve pressure to bony prominences for patients at moderate and high risk   Avoid shearing injuries   Use bath wipes, not soap and water, for daily bathing   Keep intact skin clean and dry   Use incontinence wipes for cleaning urine, stool and caustic drainage. Foley care as needed   Monitor external devices/tubes for correct placement to prevent pressure, friction and shearing   Encourage use of lotion/moisturizer on skin   Monitor patient's hygiene practices   Consult/collaborate with wound care nurse   Utilize specialty bed   Consider placing an indwelling catheter if incontinence interferes with healing of stage 3 or 4 pressure injury  Goal: Nutritional status is improving  Outcome: Progressing  Flowsheets (Taken 07/22/2019 1849)  Nutritional status is improving:   Assist patient with eating   Allow adequate time for meals   Encourage patient to take dietary supplement(s) as ordered   Collaborate with Clinical Nutritionist   Include patient/patient care companion in decisions related to nutrition     Problem: Renal Instability  Goal: Fluid and electrolyte balance are achieved/maintained  Outcome: Progressing  Flowsheets (Taken 07/22/2019 1849)  Fluid and electrolyte balance are achieved/maintained:   Monitor  intake and output every shift   Monitor/assess lab values and report abnormal values   Provide adequate hydration   Monitor daily weight   Assess for confusion/personality changes   Assess and reassess fluid and electrolyte status   Observe for seizure activity and initiate seizure precautions if indicated   Observe for cardiac arrhythmias   Monitor for muscle weakness   Follow fluid restrictions/IV/PO parameters  Goal: Nutritional intake is adequate  Outcome: Progressing  Flowsheets (Taken 07/22/2019 1849)  Nutritional intake is adequate:   Monitor daily weights   Assist patient with meals/food selection   Allow adequate time for meals   Encourage/perform oral hygiene as appropriate   Encourage/administer dietary supplements as ordered (i.e. tube feed, TPN, oral, OGT/NGT, supplements)   Consult/collaborate with Clinical Nutritionist   Include patient/patient care companion in decisions related to nutrition   Assess anorexia, appetite, and amount of meal/food tolerated   Consult/collaborate with Speech Therapy (swallow evaluations)     Problem: Patient Receiving Advanced Renal Therapies  Goal: Therapy access site remains intact  Outcome: Progressing  Flowsheets (Taken 07/22/2019 1849)  Therapy access site remains intact:   Assess therapy access site   Change therapy access site dressing as needed

## 2019-07-22 NOTE — Plan of Care (Signed)
Problem: Pain  Goal: Pain at adequate level as identified by patient  Outcome: Progressing  Flowsheets (Taken 07/22/2019 0535)  Pain at adequate level as identified by patient:   Identify patient comfort function goal   Assess for risk of opioid induced respiratory depression, including snoring/sleep apnea. Alert healthcare team of risk factors identified.   Assess pain on admission, during daily assessment and/or before any "as needed" intervention(s)   Reassess pain within 30-60 minutes of any procedure/intervention, per Pain Assessment, Intervention, Reassessment (AIR) Cycle   Evaluate patient's satisfaction with pain management progress   Evaluate if patient comfort function goal is met   Offer non-pharmacological pain management interventions     Problem: Compromised Tissue integrity  Goal: Damaged tissue is healing and protected  Outcome: Progressing  Flowsheets (Taken 07/22/2019 0535)  Damaged tissue is healing and protected:   Monitor/assess Braden scale every shift   Reposition patient every 2 hours and as needed unless able to reposition self   Provide wound care per wound care algorithm   Increase activity as tolerated/progressive mobility   Relieve pressure to bony prominences for patients at moderate and high risk   Avoid shearing injuries   Use bath wipes, not soap and water, for daily bathing   Keep intact skin clean and dry   Use incontinence wipes for cleaning urine, stool and caustic drainage. Foley care as needed   Monitor external devices/tubes for correct placement to prevent pressure, friction and shearing     Problem: Renal Instability  Goal: Fluid and electrolyte balance are achieved/maintained  Outcome: Progressing  Flowsheets (Taken 07/22/2019 0535)  Fluid and electrolyte balance are achieved/maintained:   Monitor intake and output every shift   Monitor/assess lab values and report abnormal values   Provide adequate hydration   Assess and reassess fluid and electrolyte  status   Assess for confusion/personality changes   Monitor daily weight   Observe for cardiac arrhythmias   Observe for seizure activity and initiate seizure precautions if indicated   Monitor for muscle weakness     Received patient drowsy, responds to stimuli. Aox4, forgetful at times. PERRLA.  On 02 2L NC. No respiratory distress noted.    SR on the monitor Hr60-80s. SBP 70-110s. Np Jen aware of SBP on the 70s, ordered 250 bolus. Pt recovered from low SBP when woken up. Hold bolus for now.  Afebrile. C/o pain in her hand. PRN pain meds given and tolerated.   Wound care done and tolerated.  H/H was 5.7/19.3 this AM. Notified NP. T&S done. NP Jen ordered 1 unit of PRBC. Will notify incoming RN.   Attended all needs. Safety precautions maintained.  Will cont monitor.

## 2019-07-22 NOTE — Progress Notes (Signed)
Pt AOX3 upon arrival to room, primary nurse present.  PPE worn for Contact. Assessment complete. R CVC dressing C/D/I. Unable to aspirated arterial port, but can easily flush.  Lines reversed HD initiated using 2 K 2.5 bath with no fluid removal. Pt due to receive 1 unit of PRBCS during HD.         07/22/19 1245   Vitals   Heart Rate 75   Resp Rate (!) 25   BP 114/55   SpO2 94 %   O2 Device Nasal cannula   Machine Metrics   $Treatment Started/Capturing Charge Yes   Blood Flow Rate (mL/min) 350 mL/min   Arterial Pressure (mmHg) -194 mmHg   Venous Pressure (mmHg) 148   Dialysate Flow Rate (mL/min) 700 mL/min   Transmembrane Pressure (mmHg) 60 mmHg   Ultrafiltration Rate (mL/Hr) 140 mL/hr   Fluid Removal (ml) 52 ml   Fluid Bolus (ml) 0 ml   Hemodialysis Comments   Arteriovenous Lines Secure Yes   Comments Eating lunch

## 2019-07-22 NOTE — Progress Notes (Signed)
UAI completed.    Form ID # :[id number]    The following forms were included in the submission:  [place] Poli  [id number]    Will need to be faxed to [facility] to [phone].      Heide Scales, MSN, RN, [place]  Case Manager [facility]

## 2019-07-22 NOTE — Progress Notes (Signed)
FOUR EYES SKIN ASSESSMENT NOTE    GREENLEIGH KAUTH  [birth year]  [id number]    Braden Scale Score: 13    POC Initiated for Risk for Altered Skin Yes    Patient Assessed for Correct Mattress Surface Yes  *At risk patients with Braden Score less than 12 must be considered for specialty bed    Mepilex or Adhesive Foam Dressing applied to sacrum/heel if any PI risk factors present: Yes    If Wound/Pressure Injury present:    Wound/PI assessment documented in EHR: Yes    Admitting physician notified: Yes    Wound consult ordered: Yes    Willodean Rosenthal, RN  July 22, 2019  6:57 PM    Second RN/PCT Name:

## 2019-07-22 NOTE — Treatment Plan (Signed)
FOUR EYES SKIN ASSESSMENT NOTE    [name]  05-18-1955  [id number]    Braden Scale Score: 13    POC Initiated for Risk for Altered Skin Yes    Patient Assessed for Correct Mattress Surface Yes  *At risk patients with Braden Score less than 12 must be considered for specialty bed    Mepilex or Adhesive Foam Dressing applied to sacrum/heel if any PI risk factors present: Yes    If Wound/Pressure Injury present:    Wound/PI assessment documented in EHR: Yes    Admitting physician notified: Yes    Wound consult ordered: Yes    Mikki Santee, RN  July 22, 2019  5:35 AM    Second RN/PCT Name:

## 2019-07-22 NOTE — Progress Notes (Signed)
ICU Daily Progress Note                                                                                                 [phone]    Date Time: 07/22/19 9:26 AM  Patient Name: [name], [name] 64 y.o. female admitted with Shortness of breath  Attending Physician: Evon Slack, M*  Room: A2701/A2701-01   Admit Date: 07/20/2019  LOS: 1 day    Patient status: Inpatient  Hospital Day: 1           ICU PROBLEM LIST:    Shock, anemia, end-stage renal disease, left lower lobe pneumonia, pericardial effusion, metabolic acidosis, diabetes mellitus, sleep apnea, COPD,    Assessment:   Hyperkalemia  Anemia rule out GI bleed  Shock?  Septic patient was on peritoneal dialysis  ?  Peritonitis complains of abdominal tenderness although no guarding  Pneumonia  Recent MDR infection  End-stage renal disease on peritoneal dialysis now will be started on hemodialysis  Pericardial effusion without tamponade  Diabetes mellitus  Hyperlipidemia  Sleep apnea  COPD  Plan:   Blood transfusion  Hemodialysis  Stool for guaiac  Continue broad-spectrum antibiotic  If patient has persistent abdominal pain will have peritoneal tap  GI prophylaxis  Serum cortisol  ACTH stimulation test  Midodrine    Patient has a high probability of sudden clinically significant deterioration which requires the highest level of physician preparedness to intervene urgently.  I managed/supervised life or organ supporting interventions that required frequent physician assessment.  I devoted my full attention in the ICU to the direct care of patient for this period of time.  Organ systems that require intensive critical care support include respiratory      Code Status: full code        Plan of care discussed with ICU nurse, pharmacy, nutritionist and respiratory therapy  Subjective:           Patient seen and examined  Has slight abdominal discomfort no nausea vomiting no black stool has dropped hematocrit significantly        Medications:       Scheduled Meds: PRN Meds:    atorvastatin, 40 mg, Oral, Daily  collagenase, , Topical, Q24H [place]  famotidine, 10 mg, Oral, Daily  gabapentin, 300 mg, Oral, Daily  insulin lispro, 1-6 Units, Subcutaneous, QHS  insulin lispro, 2-10 Units, Subcutaneous, TID AC  lidocaine, 1 patch, Transdermal, Q24H SCH  meropenem, 500 mg, Intravenous, Q8H  midodrine, 10 mg, Oral, TID MEALS  sevelamer, 800 mg, Oral, TID MEALS  sodium chloride, 250 mL, Intravenous, Once  vancomycin, , Intravenous, See Admin Instructions        Continuous Infusions:   calcium GLUConate CRRT Infusion      norepinephrine (LEVOPHED) infusion      prismaSATE 4/2.5      prismaSATE 4/2.5      sodium chloride, , PRN  acetaminophen, 650 mg, Q6H PRN   Or  acetaminophen, 650 mg, Q6H PRN  albumin human, 100 mL, PRN  dextrose, 15 g of glucose, PRN   And  dextrose, 12.5 g, PRN  And  glucagon (rDNA), 1 mg, PRN  magnesium sulfate, 1 g, PRN  melatonin, 3 mg, QHS PRN  naloxone, 0.2 mg, PRN  ondansetron, 4 mg, Q6H PRN   Or  ondansetron, 4 mg, Q6H PRN  oxyCODONE, 10 mg, Q3H PRN  potassium chloride, 10 mEq, PRN  potassium chloride, 20 mEq, PRN  sodium chloride (PF), 10 mL, PRN  sodium chloride, 100 mL, Q1H PRN  sodium chloride, 250 mL, PRN  sodium phosphate IVPB, 20 mmol, PRN  sodium phosphate IVPB, 30 mmol, PRN          Review of Systems:              General ROS:  Afebrile no weight loss weight gain   ENT/EYE /ROS:  No vision changes, No headache, No sore throat, no nasal discharge   Endocrine ROS:   fatigue   Respiratory ROS:  No shortness of breath, wheezing, cough, chest congestion    Cardiovascular ROS:  No chest pain or palpitation, no edema   Gastrointestinal ROS: Abdominal discomfort no no melanotic stool no nausea vomiting or hematemesis    Genito-Urinary ROS:  No burning in the urine or hematuria   Musculoskeletal ROS:  No musculoskeletal deformities   Neurological ROS:  No stroke seizure disorder   Dermatological ROS:  No skin rash      Physical  Exam:           General appearance - no visible respiratory distress patient does not appear toxic  Mental status -   Alert and oriented x3  Eyes - EOMI No Icterus PERRL  Nose - no nasal discharge  Mouth - mucous membrane is moist.   Neck - no JVD, neck is supple, no  lymphadenopathy or thyromegaly  Chest - clear to auscultation  Heart - S1-S2 RRR no S3-S4 no murmur, sinus  Abdomen -tender without guarding bowel sounds are normal  Neurological - no motor or sensory deficit  Extremities - no edema, pulses 2/4, no  clubbing or cyanosis  Skin - no skin rash  Capillary refill time - <3 sec  Genitourinary -adequate urine output       Data:     Invasive ICU Hemodynamics:                     IBW/kg (Calculated) : 50.1    ABGs:         Invalid input(s): STATS, O2DEL, O2FLO, PRESS, VNTMN, PRSUP          Vent Settings:  IBW:      Patient Lines/Drains/Airways Status    Active PICC Line / CVC Line / PIV Line / Drain / Airway / Intraosseous Line / Epidural Line / ART Line / Line / Wound / Pressure Ulcer / NG/OG Tube     Name:   Placement date:   Placement time:   Site:   Days:    Peripheral IV 07/20/19 20 G Left Antecubital   07/20/19    2055    Antecubital   1    Peripheral IV 07/22/19 20 G Anterior;Proximal;Right Forearm   07/22/19    0100    Forearm   less than 1    Wound 05/27/19 Surgical Incision Abdomen Other (Comment) 4x4, abd, tape    05/27/19    2349    Abdomen   55    Wound 07/06/19 Thigh Right [place]ellon, hard, and a little open area   07/06/19    1000    Thigh  15    Wound 07/08/19 Moisture Associated Skin Damage (MASD) Perineum   07/08/19    1038    Perineum   13              VITAL SIGNS   Temp:  [97.2 F (36.2 C)-98.4 F (36.9 C)] 98.4 F (36.9 C)  Heart Rate:  [68-77] 71  Resp Rate:  [12-28] 18  BP: (60-120)/(40-66) 99/57  Pulse ox:    Intake/Output Summary (Last 24 hours) at 07/22/2019 0926  Last data filed at 07/22/2019 0035  Gross per 24 hour   Intake 2325 ml   Output --   Net 2325 ml        Labs:     CBC w/Diff  CMP   Recent Labs   Lab 07/22/19  0848 07/22/19  0411 07/21/19  0858 07/20/19  2052 07/20/19  2052   WBC 8.20 7.91 10.79*  More results in Results Review 9.77*   Hgb 5.7* 5.7* 7.6*  More results in Results Review 7.4*   Hematocrit 18.8* 19.3* 25.5*  More results in Results Review 25.0*   Platelets 159 161 175  More results in Results Review 171   MCV 91.3 91.5 91.4  More results in Results Review 91.2   Neutrophils  --  73.2 73.1  --  70.6   More results in Results Review = values in this interval not displayed.       PT/INR         Recent Labs   Lab 07/22/19  0335 07/22/19  0031 07/21/19  2003 07/21/19  7290 07/20/19  2052 07/18/19  0335 07/18/19  0335 07/17/19  0420 07/17/19  0420   Sodium 137 135* 137  More results in Results Review 133*  More results in Results Review 136  More results in Results Review 134*   Potassium 5.0 5.0 5.0  More results in Results Review 6.2*  More results in Results Review 5.3*  More results in Results Review 5.3*   Chloride 98* 96* 97*  More results in Results Review 95*  More results in Results Review 98*  More results in Results Review 96*   CO2 21* 20* 20*  More results in Results Review 23  More results in Results Review 23  More results in Results Review 23   BUN 96.0* 96.0* 94.0*  More results in Results Review 91.0*  More results in Results Review 73.0*  More results in Results Review 77.0*   Creatinine 9.7* 9.5* 9.4*  More results in Results Review 9.1*  More results in Results Review 7.3*  More results in Results Review 7.3*   Glucose 139* 154* 164*  More results in Results Review 212*  More results in Results Review 141*  More results in Results Review 229*   Calcium 8.1* 8.7 8.5  More results in Results Review 8.9  More results in Results Review 8.8  More results in Results Review 8.4*   Magnesium 2.3  --   --   --   --   --  2.1  --  2.0   Phosphorus 8.7*  --   --   --   --   --  5.5*  --  5.2*   Protein, Total  --   --   --   --  7.5  --   --   --   --    Albumin 3.3*  --    --   --  2.8*  --  2.5*  More results in Results Review 2.6*   AST (SGOT)  --   --   --   --  10  --   --   --   --    ALT  --   --   --   --  11  --   --   --   --    Alkaline Phosphatase  --   --   --   --  109*  --   --   --   --    Bilirubin, Total  --   --   --   --  0.3  --   --   --   --    More results in Results Review = values in this interval not displayed.      Glucose POCT   Recent Labs   Lab 07/22/19  0335 07/22/19  0031 07/21/19  2003 07/21/19  1250 07/21/19  0858 07/20/19  2052 07/18/19  0335   Glucose 139* 154* 164* 213* 210* 212* 141*        Recent Labs   Lab 07/21/19  1058 07/21/19  0858 07/20/19  2052   Troponin I 0.01 <0.01 <0.01       Urinalysis        Invalid input(s): LEUKOCYTESUR    Rads:   XR Chest AP Portable    Result Date: 07/22/2019   There is a prominent appearance of the cardiac silhouette. Mediastinal silhouette appears within normal size limits for portable technique and patient positioning. Central venous catheter does not appear significantly changed. Pulmonary vascular congestion does not appear significantly changed. There is mild persistent blunting of the left costophrenic angle. There is no evidence for pneumothorax. Edison Simon, MD  07/22/2019 7:58 AM          I have personally reviewed the patients history and 24 hour interval events, along with vitals, labs, radiology images .    So far today I have spent 40___ minutes providing care for this patient excluding teaching and billable procedures, and not overlapping with any other providers.:    Quillian Quince MD   07/22/19 9:26 AM

## 2019-07-23 DIAGNOSIS — I953 Hypotension of hemodialysis: Secondary | ICD-10-CM

## 2019-07-23 DIAGNOSIS — N186 End stage renal disease: Secondary | ICD-10-CM

## 2019-07-23 DIAGNOSIS — I313 Pericardial effusion (noninflammatory): Secondary | ICD-10-CM

## 2019-07-23 DIAGNOSIS — R42 Dizziness and giddiness: Secondary | ICD-10-CM

## 2019-07-23 DIAGNOSIS — Z992 Dependence on renal dialysis: Secondary | ICD-10-CM

## 2019-07-23 LAB — CROSSMATCH PRBC, 1 UNIT
Expiration Date: 202108032359
ISBT CODE: 5100
Status: TRANSFUSED
UTYPE: O POS

## 2019-07-23 LAB — CBC AND DIFFERENTIAL
Absolute NRBC: 0 10*3/uL (ref 0.00–0.00)
Basophils Absolute Automated: 0.03 10*3/uL (ref 0.00–0.08)
Basophils Automated: 0.4 %
Eosinophils Absolute Automated: 0.15 10*3/uL (ref 0.00–0.44)
Eosinophils Automated: 1.9 %
Hematocrit: 24.8 % — ABNORMAL LOW (ref 34.7–43.7)
Hgb: 7.7 g/dL — ABNORMAL LOW (ref 11.4–14.8)
Immature Granulocytes Absolute: 0.06 10*3/uL (ref 0.00–0.07)
Immature Granulocytes: 0.8 %
Lymphocytes Absolute Automated: 1.23 10*3/uL (ref 0.42–3.22)
Lymphocytes Automated: 15.6 %
MCH: 28.4 pg (ref 25.1–33.5)
MCHC: 31 g/dL — ABNORMAL LOW (ref 31.5–35.8)
MCV: 91.5 fL (ref 78.0–96.0)
MPV: 11 fL (ref 8.9–12.5)
Monocytes Absolute Automated: 0.98 10*3/uL — ABNORMAL HIGH (ref 0.21–0.85)
Monocytes: 12.4 %
Neutrophils Absolute: 5.43 10*3/uL (ref 1.10–6.33)
Neutrophils: 68.9 %
Nucleated RBC: 0 /100 WBC (ref 0.0–0.0)
Platelets: 179 10*3/uL (ref 142–346)
RBC: 2.71 10*6/uL — ABNORMAL LOW (ref 3.90–5.10)
RDW: 15 % (ref 11–15)
WBC: 7.88 10*3/uL (ref 3.10–9.50)

## 2019-07-23 LAB — RENAL FUNCTION PANEL
Albumin: 3.3 g/dL — ABNORMAL LOW (ref 3.5–5.0)
Anion Gap: 15 (ref 5.0–15.0)
BUN: 57 mg/dL — ABNORMAL HIGH (ref 7.0–19.0)
CO2: 22 mEq/L (ref 22–29)
Calcium: 8.9 mg/dL (ref 8.5–10.5)
Chloride: 100 mEq/L (ref 100–111)
Creatinine: 6.1 mg/dL — ABNORMAL HIGH (ref 0.6–1.0)
Glucose: 164 mg/dL — ABNORMAL HIGH (ref 70–100)
Phosphorus: 6.1 mg/dL — ABNORMAL HIGH (ref 2.3–4.7)
Potassium: 4.3 mEq/L (ref 3.5–5.1)
Sodium: 137 mEq/L (ref 136–145)

## 2019-07-23 LAB — GLUCOSE WHOLE BLOOD - POCT
Whole Blood Glucose POCT: 85 mg/dL (ref 70–100)
Whole Blood Glucose POCT: 197 mg/dL — ABNORMAL HIGH (ref 70–100)
Whole Blood Glucose POCT: 185 mg/dL — ABNORMAL HIGH (ref 70–100)
Whole Blood Glucose POCT: 194 mg/dL — ABNORMAL HIGH (ref 70–100)

## 2019-07-23 LAB — VANCOMYCIN, RANDOM: Vancomycin Random: 38.6 ug/mL

## 2019-07-23 LAB — HEMOLYSIS INDEX: Hemolysis Index: 6 (ref 0–18)

## 2019-07-23 LAB — GFR: EGFR: 8.4

## 2019-07-23 LAB — MAGNESIUM: Magnesium: 2.2 mg/dL (ref 1.6–2.6)

## 2019-07-23 MED ORDER — RISAQUAD PO CAPS
1.00 | ORAL_CAPSULE | Freq: Every day | ORAL | Status: DC
Start: 2019-07-23 — End: 2019-07-31
  Administered 2019-07-23 – 2019-07-31 (×9): 1 via ORAL
  Filled 2019-07-23 (×9): qty 1

## 2019-07-23 MED ORDER — SODIUM HYPOCHLORITE 0.125 % EX SOLN
Freq: Two times a day (BID) | CUTANEOUS | Status: DC
Start: 2019-07-23 — End: 2019-07-31
  Filled 2019-07-23: qty 473

## 2019-07-23 MED ORDER — DARBEPOETIN ALFA 60 MCG/0.3ML IJ SOSY
60.00 ug | PREFILLED_SYRINGE | INTRAMUSCULAR | Status: DC
Start: 2019-07-23 — End: 2019-07-31
  Administered 2019-07-23 – 2019-07-29 (×2): 60 ug via SUBCUTANEOUS
  Filled 2019-07-23 (×4): qty 0.3

## 2019-07-23 NOTE — Progress Notes (Signed)
[facility]  PROGRESS NOTE      Patient: [name]  Date: 07/23/2019   LOS: 2 Days  Admission Date: 07/20/2019   MRN: [id number]  Attending: Lisa Roca  Please contact me on the following Spectralink [phone]       ASSESSMENT/PLAN     [name] is a 64 y.o. female admitted with Shortness of breath    Interval Summary:     Patient Active Hospital Problem List:  CHF/fluid overload.  Plan dialysis and ultrafiltration in a.m.    End-stage renal disease  Seen by Dr. Onnie Boer nephrology plan for dialysis in a.m.    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 and Aranesp    Patient appeared somnolent and repetitive today  Although she is oriented x3  Unclear if this is due to medication  We will hold Neurontin in a.m.      Pericardial effusion  Patient seen by [facility] plan to repeat echocardiogram    Abdominal wound with MRSA, Enterococcus and Pseudomonas  Follow-up on wound cultures if negative discontinue antibiotics per ID    Hypotension/shock  Probably due to hypovolemia and recent MDR infection      Sleep apnea by history    Hyperlipidemia  Continue statins    Pneumonia  Clinically improved.    ACTH stimulation test normal no evidence of adrenal insufficiency      Discussed with patient and RN at bedside        Analgesia: Tylenol    Nutrition: Consistent carbohydrate cardiac diet    DVT Prophylaxis: Lovenox       Code Status: Full code    DISPO: to  Be determined    Family Contact: Since daughter       SUBJECTIVE     [name] states she has chest pain    MEDICATIONS     Current Facility-Administered Medications   Medication Dose Route Frequency    atorvastatin  40 mg Oral Daily    collagenase   Topical Q24H SCH    darbepoetin alfa  60 mcg Subcutaneous Weekly    gabapentin  300 mg Oral Daily    insulin lispro  1-6 Units Subcutaneous QHS    insulin lispro  2-10 Units Subcutaneous TID AC    lactobacillus/streptococcus  1 capsule Oral Daily    lidocaine  1 patch  Transdermal Q24H SCH    meropenem  500 mg Intravenous Q8H    midodrine  10 mg Oral TID MEALS    pantoprazole  40 mg Oral QAM AC    sevelamer  800 mg Oral TID MEALS    sodium chloride  250 mL Intravenous Once    sodium hypochlorite   Irrigation Q12H    vancomycin   Intravenous See Admin Instructions       PHYSICAL EXAM     Vitals:    07/23/19 1757   BP: 126/72   Pulse: 72   Resp: (!) 2   Temp: 98.2 F (36.8 C)   SpO2: 91%       Temperature: Temp  Min: 98.2 F (36.8 C)  Max: 98.9 F (37.2 C)  Pulse: Pulse  Min: 68  Max: 79  Respiratory: Resp  Min: 2  Max: 39  Non-Invasive BP: BP  Min: 105/72  Max: 137/62  Pulse Oximetry SpO2  Min: 86 %  Max: 99 %    Intake and Output Summary (Last 24 hours) at Date Time  Intake/Output Summary (Last 24 hours) at 07/23/2019 1934  Last data filed at 07/23/2019 0837  Gross per 24 hour   Intake 955 ml   Output 120 ml   Net 835 ml         GEN APPEARANCE: Drowsy but arousable A&OX3  HEENT: PERLA; EOMI; Conjunctiva Clear  NECK: Supple; No bruits  CVS: RRR, S1, S2; No M/G/R  LUNGS: CTAB; No Wheezes; No Rhonchi: No rales  ABD: Soft; No TTP; + Normoactive BS  EXT: No edema; Pulses 2+ and intact  Skin exam: Warm and dry  NEURO: CN 2-12 intact; No Focal neurological deficits  CAP REFILL: Less than 3 seconds capillary refill  MENTAL STATUS: Drowsy but arousable oriented x3 but slow to respond          LABS     Recent Labs   Lab 07/23/19  0444 07/22/19  1542 07/22/19  0848   WBC 7.88 11.07* 8.20   RBC 2.71* 2.42* 2.06*   Hgb 7.7* 6.8* 5.7*   Hematocrit 24.8* 21.8* 18.8*   MCV 91.5 90.1 91.3   Platelets 179 155 159       Recent Labs   Lab 07/23/19  0444 07/22/19  0335 07/22/19  0031 07/21/19  2003 07/21/19  1250 07/20/19  2052 07/18/19  0335 07/17/19  0420 07/17/19  0420   Sodium 137 137 135* 137 136  More results in Results Review 136  More results in Results Review 134*   Potassium 4.3 5.0 5.0 5.0 4.9  More results in Results Review 5.3*  More results in Results Review 5.3*   Chloride 100  98* 96* 97* 97*  More results in Results Review 98*  More results in Results Review 96*   CO2 22 21* 20* 20* 21*  More results in Results Review 23  More results in Results Review 23   BUN 57.0* 96.0* 96.0* 94.0* 91.0*  More results in Results Review 73.0*  More results in Results Review 77.0*   Creatinine 6.1* 9.7* 9.5* 9.4* 9.2*  More results in Results Review 7.3*  More results in Results Review 7.3*   Glucose 164* 139* 154* 164* 213*  More results in Results Review 141*  More results in Results Review 229*   Calcium 8.9 8.1* 8.7 8.5 8.4*  More results in Results Review 8.8  More results in Results Review 8.4*   Magnesium 2.2 2.3  --   --   --   --  2.1  --  2.0   More results in Results Review = values in this interval not displayed.       Recent Labs   Lab 07/23/19  0444 07/22/19  0335 07/20/19  2052   ALT  --   --  11   AST (SGOT)  --   --  10   Bilirubin, Total  --   --  0.3   Albumin 3.3* 3.3* 2.8*   Alkaline Phosphatase  --   --  109*       Recent Labs   Lab 07/21/19  1058 07/21/19  0858 07/20/19  2052   Troponin I 0.01 <0.01 <0.01             Microbiology Results     Procedure Component Value Units Date/Time    Blood Culture Aerobic/Anaerobic #1 [[id number]] Collected: 07/20/19 2052    Specimen: Arm from Blood, Venipuncture Updated: 07/23/19 0221    Narrative:      ORDER#: [id number]  ORDERED BY: THOMAS, SASHA  SOURCE: Blood, Venipuncture arm                      COLLECTED:  07/20/19 20:52  ANTIBIOTICS AT COLL.:                                RECEIVED :  07/20/19 21:25  Culture Blood Aerobic and Anaerobic        PRELIM      07/23/19 02:21  07/22/19   No Growth after 1 day/s of incubation.  07/23/19   No Growth after 2 day/s of incubation.      Blood Culture Aerobic/Anaerobic #2 [[id number]] Collected: 07/20/19 2052    Specimen: Arm from Blood, Venipuncture Updated: 07/23/19 0221    Narrative:      ORDER#: [id number]                                    ORDERED BY: Randolm Idol  SOURCE: Blood, Venipuncture arm                      COLLECTED:  07/20/19 20:52  ANTIBIOTICS AT COLL.:                                RECEIVED :  07/20/19 21:25  Culture Blood Aerobic and Anaerobic        PRELIM      07/23/19 02:21  07/22/19   No Growth after 1 day/s of incubation.  07/23/19   No Growth after 2 day/s of incubation.      COVID-19 (SARS-COV-2) [employer] [facility]) [[id number]] Collected: 07/21/19 0152    Specimen: Nasopharyngeal Swab from Nasopharynx Updated: 07/21/19 0324     Purpose of COVID testing Screening     SARS-CoV-2 Specimen Source Nasopharyngeal     SARS CoV 2 Overall Result Negative     Comment: Test performed using the Abbott ID NOW EUA assay.  Please see Fact Sheets for patients and providers located at:  http://[facility]-hall.info/    This test is for the qualitative detection of SARS-CoV-2  (COVID19) nucleic acid. Viral nucleic acids may persist in vivo,  independent of viability. Detection of viral nucleic acid does  not imply the presence of infectious virus, or that virus  nucleic acid is the cause of clinical symptoms. Negative  results should be treated as presumptive and, if inconsistent  with clinical signs and symptoms or necessary for patient  management, should be tested with an alternative molecular  assay. Negative results do not preclude SARS-CoV-2 infection  and should not be used as the sole basis for patient  management decisions. Invalid results may be due to inhibiting  substances in the specimen and recollection should occur.         Narrative:      o Collect and clearly label specimen type:  o Upper respiratory specimen: One Nasopharyngeal Dry Swab NO  Transport Media.  o Hand deliver to laboratory ASAP  Indication for testing->Extended care facility admission to  semi private room  Screening    Culture + Gram Stain,Aerobic, Wound [[id number]] Collected: 07/22/19 1033    Specimen: Wound Updated: 07/23/19 1527    Narrative:      culturette  ORDER#: [id number]  ORDERED BY: REINES, ERIC  SOURCE: Wound abdominal wall                         COLLECTED:  07/22/19 10:33  ANTIBIOTICS AT COLL.:                                RECEIVED :  07/22/19 13:37  ORDER ENTRY COMMENTS:  culturette  Stain, Gram                                FINAL       07/22/19 15:20  07/22/19   Few WBCs             No Squamous epithelial cells seen             No organisms seen  Culture and Gram Stain, Aerobic, Wound     PRELIM      07/23/19 15:27   +  07/23/19   Light growth of Staphylococcus aureus               Further workup to follow including susceptibility testing        MRSA culture - Nares [[id number]] Collected: 07/21/19 1058    Specimen: Culturette from Nares Updated: 07/22/19 1117     Culture MRSA Surveillance Negative for Methicillin Resistant Staph aureus    MRSA culture - Throat [[id number]] Collected: 07/21/19 1058    Specimen: Culturette from Throat Updated: 07/22/19 1117     Culture MRSA Surveillance Negative for Methicillin Resistant Staph aureus           RADIOLOGY     Upon my review:    Shaune Pascal  7:34 PM 07/23/2019

## 2019-07-23 NOTE — Plan of Care (Signed)
Patient alert and oriented, prn pain medication given for abdominal wound pain, Hemoglobin 6.8, Np made aware with orders, transfuse 1 units blood. Will monitor labs.  Problem: Safety  Goal: Patient will be free from injury during hospitalization  Flowsheets (Taken 07/22/2019 1849 by Willodean Rosenthal, RN)  Patient will be free from injury during hospitalization:   Assess patient's risk for falls and implement fall prevention plan of care per policy   Provide and maintain safe environment   Include patient/ family/ care giver in decisions related to safety   Use appropriate transfer methods   Ensure appropriate safety devices are available at the bedside   Hourly rounding   Assess for patients risk for elopement and implement [place] per policy   Provide alternative method of communication if needed (communication boards, writing)     Problem: Pain  Goal: Pain at adequate level as identified by patient  Flowsheets (Taken 07/22/2019 1849 by Willodean Rosenthal, RN)  Pain at adequate level as identified by patient:   Identify patient comfort function goal   Assess for risk of opioid induced respiratory depression, including snoring/sleep apnea. Alert healthcare team of risk factors identified.   Reassess pain within 30-60 minutes of any procedure/intervention, per Pain Assessment, Intervention, Reassessment (AIR) Cycle   Assess pain on admission, during daily assessment and/or before any "as needed" intervention(s)   Evaluate patient's satisfaction with pain management progress   Evaluate if patient comfort function goal is met   Offer non-pharmacological pain management interventions   Consult/collaborate with Pain Service   Consult/collaborate with Physical Therapy, Occupational Therapy, and/or Speech Therapy     Problem: Compromised Tissue integrity  Goal: Damaged tissue is healing and protected  Flowsheets (Taken 07/22/2019 1849 by Willodean Rosenthal, RN)  Damaged tissue is healing and protected:   Monitor/assess Braden scale  every shift   Provide wound care per wound care algorithm   Reposition patient every 2 hours and as needed unless able to reposition self   Increase activity as tolerated/progressive mobility   Relieve pressure to bony prominences for patients at moderate and high risk   Avoid shearing injuries   Use bath wipes, not soap and water, for daily bathing   Keep intact skin clean and dry   Use incontinence wipes for cleaning urine, stool and caustic drainage. Foley care as needed   Monitor external devices/tubes for correct placement to prevent pressure, friction and shearing   Encourage use of lotion/moisturizer on skin   Monitor patient's hygiene practices   Consult/collaborate with wound care nurse   Utilize specialty bed   Consider placing an indwelling catheter if incontinence interferes with healing of stage 3 or 4 pressure injury     Problem: Renal Instability  Goal: Fluid and electrolyte balance are achieved/maintained  Flowsheets (Taken 07/22/2019 1849 by Willodean Rosenthal, RN)  Fluid and electrolyte balance are achieved/maintained:   Monitor intake and output every shift   Monitor/assess lab values and report abnormal values   Provide adequate hydration   Monitor daily weight   Assess for confusion/personality changes   Assess and reassess fluid and electrolyte status   Observe for seizure activity and initiate seizure precautions if indicated   Observe for cardiac arrhythmias   Monitor for muscle weakness   Follow fluid restrictions/IV/PO parameters

## 2019-07-23 NOTE — Plan of Care (Signed)
Pt transferred from [facility] today.  Report given by [facility].  Vital signs stable.  Pt c/o pain and given tylenol PRN 625 mg.  Pt is on telemetry.    Pt is alert and oriented x 4.  Pt is a high fall risk.  Bed alarm is on.      Call bell is wtihin reach.  Bed is in lowest position.          Problem: Safety  Goal: Patient will be free from injury during hospitalization  Outcome: Progressing  Flowsheets (Taken 07/23/2019 1908)  Patient will be free from injury during hospitalization:   Assess patient's risk for falls and implement fall prevention plan of care per policy   Provide and maintain safe environment   Use appropriate transfer methods   Ensure appropriate safety devices are available at the bedside   Include patient/ family/ care giver in decisions related to safety  Goal: Patient will be free from infection during hospitalization  Outcome: Progressing  Flowsheets (Taken 07/23/2019 1908)  Free from Infection during hospitalization:   Assess and monitor for signs and symptoms of infection   Monitor all insertion sites (i.e. indwelling lines, tubes, urinary catheters, and drains)   Monitor lab/diagnostic results   Encourage patient and family to use good hand hygiene technique     Problem: Compromised Tissue integrity  Goal: Damaged tissue is healing and protected  Outcome: Progressing  Flowsheets (Taken 07/23/2019 1908)  Damaged tissue is healing and protected:   Monitor/assess Braden scale every shift   Reposition patient every 2 hours and as needed unless able to reposition self   Relieve pressure to bony prominences for patients at moderate and high risk   Provide wound care per wound care algorithm   Keep intact skin clean and dry   Avoid shearing injuries

## 2019-07-23 NOTE — Progress Notes (Signed)
[facility]     Date Time: 07/23/19 8:34 AM  Patient Name: [name],[name]     Patient Active Problem List   Diagnosis    Iron deficiency anemia secondary to inadequate dietary iron intake    Sideropenic dysphagia    Peritoneal dialysis catheter dysfunction, initial encounter    Peritonitis    Upper GI bleed    ESRD (end stage renal disease) on dialysis    Generalized abdominal pain    Thrombocytopenia    Hypotension    Type 2 diabetes mellitus, with long-term current use of insulin    GERD (gastroesophageal reflux disease)    Bowel incontinence    Right sided numbness    Uncontrolled type 2 diabetes mellitus with hyperglycemia    Hypertension    Hyperlipidemia    History of COPD    Gastritis with hemorrhage    History of anemia due to chronic kidney disease    Open wound, abdominal wall, lateral, subsequent encounter    Gout    Dizziness    Fall    History of CVA (cerebrovascular accident)    Shortness of breath     Assessment:   Admitted with chest pain and hypotension dizziness hypotension-resolved with antibiotics and fluids  Acute on chronic anemia  Pericardial effusion echocardiogram not consistent with tamponade  HFpEF  Type 2 diabetes  Renal failure on peritoneal dialysis   Recent cellulitis  Anemia  VQ scan low probability for pulmonary embolism      Recommendations:      Admitted with pleuritic chest pain and hypotension, which has resolved with fluids at dialysis.   Small pericardial effusion with inflow variation but compressible IVC, not consistent with clinical cardiac tamponade   We will order repeat limited echo for tomorrow to evaluate effusion size and for tamponade   Blood pressure stable, continue dialysis.  If effusion is unchanged, we will sign off.   Discussed case with hospitalist team.      Subjective:   Denies chest pain, SOB or palpitations.    Medications:      Scheduled Meds: PRN Meds:    atorvastatin, 40 mg, Oral,  Daily  collagenase, , Topical, Q24H SCH  gabapentin, 300 mg, Oral, Daily  insulin lispro, 1-6 Units, Subcutaneous, QHS  insulin lispro, 2-10 Units, Subcutaneous, TID AC  lidocaine, 1 patch, Transdermal, Q24H SCH  meropenem, 500 mg, Intravenous, Q8H  midodrine, 10 mg, Oral, TID MEALS  pantoprazole, 40 mg, Oral, QAM AC  sevelamer, 800 mg, Oral, TID MEALS  sodium chloride, 250 mL, Intravenous, Once  vancomycin, , Intravenous, See Admin Instructions        Continuous Infusions:   calcium GLUConate CRRT Infusion      norepinephrine (LEVOPHED) infusion      prismaSATE 4/2.5      prismaSATE 4/2.5      sodium chloride, , PRN  sodium chloride, , PRN  acetaminophen, 650 mg, Q6H PRN   Or  acetaminophen, 650 mg, Q6H PRN  dextrose, 15 g of glucose, PRN   And  dextrose, 12.5 g, PRN   And  glucagon (rDNA), 1 mg, PRN  magnesium sulfate, 1 g, PRN  melatonin, 3 mg, QHS PRN  naloxone, 0.2 mg, PRN  ondansetron, 4 mg, Q6H PRN   Or  ondansetron, 4 mg, Q6H PRN  oxyCODONE, 10 mg, Q3H PRN  potassium chloride, 10 mEq, PRN  potassium chloride, 20 mEq, PRN  sodium chloride (PF), 10 mL, PRN  sodium phosphate IVPB, 20 mmol, PRN  sodium phosphate IVPB, 30 mmol, PRN          Home Meds:   Medications Prior to Admission   Medication Sig Dispense Refill Last Dose    amLODIPine (NORVASC) 5 MG tablet Take 1 tablet (5 mg total) by mouth every 12 (twelve) hours       atorvastatin (LIPITOR) 40 MG tablet Take 40 mg by mouth daily       carvedilol (COREG) 25 MG tablet Take 1 tablet (25 mg total) by mouth every 12 (twelve) hours       collagenase (SANTYL) ointment Apply topically every 24 hours 90 g 0     Dulaglutide (Trulicity) 0.17 BL/3.9QZ Solution Pen-injector Inject 0.75 mg into the skin once a week       famotidine (PEPCID) 40 MG tablet Take 40 mg by mouth daily       gabapentin (NEURONTIN) 300 MG capsule Take 1 capsule (300 mg total) by mouth daily 30 capsule 0     insulin aspart (NovoLOG) 100 UNIT/ML injection Inject 5 Units into the skin  3 (three) times daily before meals (Patient taking differently: Inject 15 Units into the skin 3 (three) times daily before meals   )       insulin glargine (LANTUS) 100 UNIT/ML injection Inject 30 Units into the skin every 12 (twelve) hours (Patient taking differently: Inject 35 Units into the skin nightly   ) 1 pen 0     lactobacillus/streptococcus (RISAQUAD) Cap Take 1 capsule by mouth daily 30 capsule 0     lidocaine (LIDODERM) 5 % Place 1 patch onto the skin every 24 hours Remove & Discard patch within 12 hours or as directed by MD 30 each 0     nystatin (NYSTOP) powder Apply 1 application topically 2 (two) times daily 30 g 0     oxyCODONE (ROXICODONE) 5 MG immediate release tablet Take 1 tablet (5 mg total) by mouth every 6 (six) hours as needed for Pain 10 tablet 0     pantoprazole (PROTONIX) 40 MG tablet Take 1 tablet (40 mg total) by mouth 2 (two) times daily       sevelamer (RENVELA) 800 MG tablet Take 800 mg by mouth 3 (three) times daily with meals       sodium hypochlorite (DAKIN'S QUARTER STRENGTH) 0.125 % Solution Irrigate with as directed daily 1 Bottle 0     vitamin C (ASCORBIC ACID) 500 MG tablet Take 1 tablet (500 mg total) by mouth daily 30 tablet 0     zinc Oxide (DESITIN) 40 % Paste paste Apply topically every 6 (six) hours       zinc sulfate (ZINCATE) 220 (50 Zn) MG capsule Take 1 capsule (220 mg total) by mouth daily 30 capsule 0           Physical Exam:   BP 110/58    Pulse 71    Temp 98.6 F (37 C) (Oral)    Resp (!) 25    Ht 1.575 m (5\' 2" )    Wt 108.8 kg (239 lb 13.8 oz)    LMP  (LMP Unknown) Comment: post menopausal   SpO2 96%    BMI 43.87 kg/m  O2 Flow Rate (Young/min): 1 Young/min O2 Device: Nasal cannula    Temp (24hrs), Avg:98.2 F (36.8 C), Min:97.8 F (36.6 C), Max:98.9 F (37.2 C)      Telemetry reviewed      Intake and Output Summary (Last 24  hours) at Date Time    Intake/Output Summary (Last 24 hours) at 07/23/2019 0834  Last data filed at 07/23/2019 0400  Gross per 24 hour    Intake 1074 ml   Output 120 ml   Net 954 ml       General Appearance:  Breathing comfortable, no acute distress  Head:  normocephalic  Eyes:  EOM's intact  Neck:  No carotid bruit or jugular venous distension, brisk carotid upstroke  Lungs:  Clear to auscultation throughout, no wheezes, rhonchi or rales, good respiratory effort   Chest Wall:  No tenderness or deformity  Heart:  S1 S2 normal No S3 or S4, without murmur/rub.  PMI non displaced   Abdomen:  Soft, non-tender, positive bowel sounds, no hepatojugular reflux  Extremities:  No cyanosis, clubbing or edema  Pulses:  2+ pedal, radial, brachial pulses equal bilateraly  Neurologic:  Alert and oriented x3, mood and affect normal  Musculoskeletal: normal strength and tone    Labs:     Recent Labs   Lab 07/21/19  1058 07/21/19  0858 07/20/19  2052   Troponin I 0.01 <0.01 <0.01     Recent Labs   Lab 07/23/19  0444 07/22/19  0335 07/20/19  2052   Bilirubin, Total  --   --  0.3   Protein, Total  --   --  7.5   Albumin 3.3*  More results in Results Review 2.8*   ALT  --   --  11   AST (SGOT)  --   --  10   More results in Results Review = values in this interval not displayed.     Recent Labs   Lab 07/23/19  0444   Magnesium 2.2         Recent Labs   Lab 07/23/19  0444 07/22/19  1542 07/22/19  0848   WBC 7.88 11.07* 8.20   Hgb 7.7* 6.8* 5.7*   Hematocrit 24.8* 21.8* 18.8*   Platelets 179 155 159     Recent Labs   Lab 07/23/19  0444 07/22/19  0335 07/22/19  0031   Sodium 137 137 135*   Potassium 4.3 5.0 5.0   Chloride 100 98* 96*   CO2 22 21* 20*   BUN 57.0* 96.0* 96.0*   Creatinine 6.1* 9.7* 9.5*   EGFR 8.4 4.9 5.0   Glucose 164* 139* 154*   Calcium 8.9 8.1* 8.7       Lab Results   Component Value Date    BNP 277 (H) 07/20/2019       Weight Monitoring 07/16/2019 07/18/2019 07/20/2019 07/21/2019 07/21/2019 07/22/2019 07/23/2019   Height - - 157.5 cm - - - -   Height Method - - - - - - -   Weight 106.9 kg 107.6 kg 107.6 kg 107.321 kg 104.8 kg 107.2 kg 108.8 kg   Weight Method  Bed Scale Bed Scale - Standing Scale - Bed Scale Bed Scale   BMI (calculated) - - 43.5 kg/m2 - - - -         Imaging:   Radiological Procedure            Signed by: Orpah Clinton, DO      [facility]  NP [place] [phone] (8am-5pm)  MD Spectralink [phone] (8am-5pm)  After hours, non urgent consult line [phone]  After Hours, urgent consults or to reach the on-cal MD [phone]

## 2019-07-23 NOTE — Consults (Signed)
Wound Ostomy Continence Consultation    Date Time: 07/23/19 2:28 PM  Patient Name: [name], [name]  Consulting Service: WOCN  Reason for Consult: surgical wound    Assessment   Assessment   Specialty Bed:   ICU Sport Mattress  Braden Score: Braden Scale Score: 16 (07/23/19 0753)  BMI: Body mass index is 43.87 kg/m.  DIET: Supervise For Meals Frequency: All meals  Diet renal Protein restriction: 80 GM Protein    Patient assessed in her room after eating breakfast. She is alert and oriented. Only abdominal wound assessed.    About 06/08/2019, she had her intraperitoneal catheter (for dialysis) surgically removed due to infection. The wound was left open, and negative pressure wound therapy (NPWT/ Wound Vac) was attempted, but Pt was unable to tolerate it due to pain. Dressing was changed to Dakins/ Maxorb AG three times weekly for infection and pain control. Patient's wound had significantly more necrotic tissue upon return to hospital 06/24/2019, orders were changed to Dakin's solution.    Patient has a surgical incision on their abdomen. It is Present On Admission. It measures  6x4x3 cm.  The wound bed is about 40% pink/red tissue with some granulation and 60% soft yellow adherent slough.  There is no tunneling or undermining. There is moderate serosanguinous drainage. The periwound is shiny and moist, with a small amount of erythema and edema. Her pain is significantly improved from previous visits.     Media Information     Document Information    Photographs   Abd 6x4x3   07/23/2019 10:34   Attached To:   Hospital Encounter on 07/20/19   Source Information    Hanley Hays, RN   [facility]         Plan   Plan    Wound care to abdominal wound:   1. Clean with normal saline or Dakin's Solution.   2. Moisten gauze with Dakins solutions (1/4 strength), squeeze out of excess fluid, open the gauze, fluff and tuck into wound.   3. Protect periwound skin with Cavilon no sting barrier film or spray.   4.  Cover with secondary dressing such as sacral border foam dressings.   5. Change dressing change every 12 hours.      Dakin's solution is used to reduce bacterial burden and promote healing.          History of Presenting Illness   HPI:   [name] is a 64 y.o. female admitted with Shortness of breath.  From H&P By Dr Loney Loh:  [name] is a 64 y.o. 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 Patientshospitalization from 05/27/2019 to 06/13/2019 due to PD associated peritonitis with Pseudomonas and E. coli s/p starting of hemodialysis on 05/26/2019 and exploratory laparotomy, lysis of adhesion and removal of PD catheter on 05/27/2019. Patient's last hemodialysis done on 06/21/2019 without any complications.    Patient was hospitalized from 06/23/2019 to 06/29/2019 when she presented with right upper extremity severe pain and swelling.  She had a duplex ultrasound that showed concern for clot due to recent dental catheter which showed cephalic vein thrombus.  At that time she was evaluated by [facility] IR who recommended no intervention.  She was initially started on Eliquis.  She underwent x-ray that showed a triquetral fracture.  She was seen by orthopedics who said that the degree of pain and swelling was hard proportion to fracture and recommended treatment empirically for gout and pseudogout.  She was started on prednisone while the Eliquis was stopped and her symptoms continued to get better.    Patient was recently hospitalized from 06/30/2019 to 07/18/2019 after presenting to the hospital for febrile illness with confusion and mechanical fall.  She was found to have  malfunctioning tunneled catheter and had an exchange as of 10 June.  During her hospital stay she had intermittent fevers and her old abdominal wound from prior history of peritoneal dialysis was found to be infected.  She was diagnosed with abdominal wound infection/cellulitis of the abdomen.  Her culture grew MRSA and Pseudomonas.  She was treated with IV vancomycin and levofloxacin. She completed her IV antibiotics in the hospital and was deemed not needing further antibiotics on discharge per infectious disease.  She was also seen by plastic surgery who recommended continue local wound care and follow-up in the office.    The patient was brought in by ambulance because of generalized body aches.  She is residing in a nursing home and per notes from ER reports not receiving attention needed.  She is complaining of stomach pains, chest pain, shortness of breath and generalized body aches for the past 2 days.  When I came to see the patient she said that she is complaining of chest pain that is constant and is worse with deep inspiration.  The pain is sharp in nature and located throughout her chest anteriorly.  The pain is constant and has been there since yesterday.  It is accompanied by shortness of breath as well.  She denies having any fever or chills, nausea or vomiting.    In the ED she was afebrile with T-max 98.2, heart rate of 71, respiratory rate of 14 and SPO2 of 98% on room air, blood pressure 105/55 and map of 69.  Her lab work-up  Was remarkable for blood potassium was 6.2, glucose of 212.  Her imaging was negative for DVT.  She underwent perfusion scan that showed no evidence of PE but did not exclude cardiac tamponade.  She was given Kayexalate in the ED and the case was discussed with nephrology.  She was admitted for further care.      Significant event:  The patient's blood pressure had dropped to 60/40 and an RRT was called on her.  I came to see the patient and she was hypotensive and  complaining of worsening chest pain.  She was placed on Trendelenburg position.  Have repeat labs showed worsening potassium of 6.4.  She was given D50 with insulin, sodium bicarbonate  and IV calcium.  She was also started on 500 cc bolus NaCl.  I discussed the case with ICU and cardiology.  I order a stat echocardiogram and talk to the tech.  Patient's blood pressure improved to 100 over 60s with a map in the 70s.  She was transferred to the ICU.    Past Medical and Surgical History     Past Medical History:   Diagnosis Date    Chronic obstructive pulmonary disease     Diabetes mellitus     Hyperlipidemia     Hypertension     Sleep apnea 2018       Past Surgical History:   Procedure Laterality Date  EGD, BIOPSY N/A 05/14/2019    Procedure: EGD, BIOPSY;  Surgeon: Ronie Spies, MD;  Location: [facility];  Service: Gastroenterology;  Laterality: N/A;    EXPLORATORY LAPAROTOMY N/A 05/27/2019    Procedure: EXPLORATORY LAPAROTOMY;  Surgeon: Herbert Moors., MD;  Location: [facility];  Service: General;  Laterality: N/A;    JOINT REPLACEMENT      shoulder    JOINT REPLACEMENT      knee    OVARY SURGERY Left     Removal    REMOVAL, FOREIGN BODY N/A 05/27/2019    Procedure: REMOVAL, PERITONEAL DIALYSIS CATHETER;  Surgeon: Herbert Moors., MD;  Location: [facility];  Service: General;  Laterality: N/A;    TUNNELED CATH CHECK/CHANGE (PERMCATH) N/A 07/03/2019    Procedure: TUNNELED CATH CHECK/CHANGE;  Surgeon: Maureen Ralphs, MD;  Location: [facility];  Service: Interventional Radiology;  Laterality: N/A;    TUNNELED CATH PLACEMENT (PERMCATH) Bilateral 05/23/2019    Procedure: TUNNELED CATH PLACEMENT;  Surgeon: Jacques Earthly, MD;  Location: [facility];  Service: Interventional Radiology;  Laterality: Bilateral;       Length of Stay:  LOS: 2 days       Lab   Significant Lab Values:  Lab Results   Component Value Date    WBC 7.88 07/23/2019    HGB 7.7 (L) 07/23/2019    HCT 24.8 (L)  07/23/2019    PLT 179 07/23/2019    CHOL 182 06/24/2019    TRIG 271 (H) 06/24/2019    HDL 36 (L) 06/24/2019    LDL 92 06/24/2019    ALT 11 07/20/2019    AST 10 07/20/2019    NA 137 07/23/2019    K 4.3 07/23/2019    CL 100 07/23/2019    CREAT 6.1 (H) 07/23/2019    BUN 57.0 (H) 07/23/2019    CO2 22 07/23/2019    TSH 1.18 07/02/2019    INR 1.1 07/01/2019    GLU 164 (H) 07/23/2019    HGBA1C 9.8 (H) 05/13/2019     Lab Results   Component Value Date    PROT 7.5 07/20/2019    ALB 3.3 (L) 07/23/2019     Lab Results   Component Value Date    IRON 38 (L) 06/26/2019    TIBC 169 (L) 06/26/2019    FERRITIN 1,070.90 (H) 05/13/2019     Lab Results   Component Value Date    CRP 7.2 (H) 06/26/2019     Lab Results   Component Value Date    ESR 102 (H) 06/26/2019     Lab Results   Component Value Date    VITD 14 (L) 07/02/2019         Thank you for this consultation,    Hanley Hays, BSN, RN, [employer], [place] [facility] Ostomy, and Continence Nurse Coordinator  [facility]  [address], [place], [place] [address]  T [phone] Jerilynn Mages [phone]  [email or web address]

## 2019-07-23 NOTE — Treatment Plan (Cosign Needed)
Brief summary of why patient was admitted and any issues in the past 24 hours (1 min max). After the brief summary, then proceed to A FASTHUGS BID.   Activity   [x]  Y / []  N  PMP Activity: Step 3 - Bed Mobility (07/23/2019  2:00 AM)     PT/OT? Mobility level? Are there activity orders (OOB, Chair, Dangle, etc.)?  If not, any reason why not?       Feeding/Fluids     [x]  Y    /   []  N  Supervise For Meals Frequency: All meals  Diet renal Protein restriction: 80 GM Protein     Intake/Output Summary (Last 24 hours) at 07/23/2019 0339  Last data filed at 07/23/2019 0134  Gross per 24 hour   Intake 679 ml   Output 120 ml   Net 559 ml          Diet ordered?  NPO?  Tube feedings initiated?  What kind? What rate?  Can they be advanced?  Need free water?  What IV fluids are running?  Is the I&O unbalanced? mean arterial pressure >65? If patient receiving any vassopresor equivalent dose of less than 12.5 mcg/min  (Malnutrition leads to worse outcomes)         Analgesia     [x]  Y    /    []  N   []  N/A    CPOT Score:    Is there adequate pain relief ordered?  Is there a pain plan of care? Can opioids be reduced, and/or replaced with non-opioid options?   (Pain affects psychological and physiological recovery)     Sedation  Paralytic?   []  Y    /    []  N    [x]  N/A    []  Y    /    []  N   [x]  N/A    RASS: 0    SAT Performed: []  Y    /    [x]  N Are daily sedation vacations performed?  Is RASS 0 to -2 being maintained (calm, comfortable, collaborative)?  (Oversedation is correlated with harmful effects)     Thromboprophylaxis   [x]  Y    /    []  N    Last Anticoagulant Admin          heparin (porcine) injection 5,000 Units    Given 5,000 Units at 07/21/19 0910    Frequency: Every 12 hours scheduled            Is lovenox or heparin in use to avoid DVT?  Are the coags abnormal?  Is anticoagulation contraindicated?  Why?  (DVT leads to increased mortality and morbidity)         HAPI Prevention     [x]  Y    /   []   N    Braden score: 16  Braden score? Does the patient need a specialty bed?  Are prevention methods in place? Are skin checks being done under devices?  Are pictures taken of existing HAPIs?  (Prevention is the goal; HAPIs increase LOS and costs)     Ulcer Prevention []  Y    /  [x]  N  ONLY IF indicated *ONLY INDICATED IF coagulopathy, mechanical ventilation for >48 hours, traumatic brain injury, history of GI ulceration or bleeding within past year, extensive burns     Glucose Control     [x]  Y    /  []   N  Recent Labs     07/22/19  0335 07/22/19  0031 07/21/19  2135 07/21/19  2003 07/21/19  1548 07/21/19  1250 07/21/19  1132 07/21/19  0955 07/21/19  0858   Glucose 139* 154*  --  164*  --  213*  --   --    < >   Whole Blood Glucose POCT  --   --  161*  --  181*  --  235* 204*  --     < > = values in this interval not displayed.      Are blood sugars under control (<180)?  Need insulin? Need long acting insulin if only sliding scale is ordered?    (Controlled glucose leads to shorter ICU stays)     Spontaneous     Breathing Trial []  Y    /   []  N    [x]  N/A    Did the patient pass the SAT today? Are they ready for CPAP? Is their sedation at a minimum?  Current vent settings? Vital signs and hemodynamics?  (Delayed weaning leads to complications, VAP, increased LOS)     Bowel Care   [x]  Y    /   []  N Bowel sounds? Passing flatus, BMs?  Distention present?  Need C.Diff culture?  Laxative? Vomiting?   (Diarrhea leads to electrolyte imbalance, dehydration, skin breakdown, delirium;  Constipation leads to discomfort, feeding intolerance, delirium)     Indwelling Catheter    Removal  Foley?  Central Line? []  Y    /  []  N    [x]  N/A        Permacath Catheter - Tunneled Right (Active)   Line necessity reviewed? Apheresis/hemodialysis 07/23/19 0200   Catheter Lumen Volume Venous 1.7 mL 07/22/19 1220   Catheter Lumen Volume Arterial 1.7 mL 07/22/19 1220   Dressing Status and Intervention Dressing Intact;Clean & Dry 07/22/19 1220   Tego/Curos Caps on  Catheter Yes 07/22/19 1220   NEW Tego/Curos Caps placed (Date) 07/17/19 07/22/19 1220   APHERESIS ONLY:  Access  Blue 07/22/19 1220   APHERESIS ONLY: Return Red 07/22/19 1220   End Caps Free From Blood Yes 07/22/19 [place] checked and tightened 07/22/19 1220   Dressing Type Transparent;Biopatch 07/23/19 0200   Dressing Status Clean;Dry;Intact 07/23/19 0200   Dressing/Line Intervention Dressing reinforced 07/22/19 1545   Dressing Change Assistant ([facility] Only) n/a 07/22/19 1545   Line Used For Blood Draw N/A 07/22/19 1545   Dressing Change Due 07/23/19 07/22/19 1545   Number of days:            N/A  Vasopressors?  Can Foley be removed? Can we use Purewick or condom cath?    Can Central Line be removed?  Can Arterial Line be removed?  Do we need a PICC?  (Longer dwell time increase risk of infection, discomfort, and reduces mobility)     De-escalation of      Antibiotics   []  Y    /   []  N   [x]  N/A What antibiotics are in use?  What are we treating? What culture/sensitivity results are available?   (Unnecessary antibiotic use promotes development of resistance   Miscellaneous Restraints []  Y / [x]  N  Palliative Care []  Y / [x]  N  Transfer or Discharge: []  Y / [x]  N  WRTC Trigger: []  Y / [x]  N  Family Conference: []  Y / [x]  N      Core Measures AMI:  [] ASA [] Beta-Blocker  CHF: [] EF% [] ACE or ARB  Stroke: [] PT/OT [] Swallow Screen   PNA: [] Immunization [] Bld culture w/in 24 hrs      Goals for today Monitor labs, Transfuse     Brief summary of patient goals that everyone agreed upon.    Participants Physician: [x]  Y / []  N  Nursing:[x]  Y / []  N  Pharmacy: [x]  Y / []  N  Nutrition: [x]  Y / []  N  Case Management: []  Y / []  N  Respiratory: [x]  Y / []  N  Ethics:[]  Y / []  N  PT/OT: []  Y / []  N

## 2019-07-23 NOTE — Progress Notes (Cosign Needed)
FOUR EYES SKIN ASSESSMENT NOTE    [name]  [birth year]  [id number]    Braden Scale Score: 8    POC Initiated for Risk for Altered Skin Yes    Patient Assessed for Correct Mattress Surface Yes  *At risk patients with Braden Score less than 12 must be considered for specialty bed    Mepilex or Adhesive Foam Dressing applied to sacrum/heel if any PI risk factors present: Yes    If Wound/Pressure Injury present:    Wound/PI assessment documented in EHR: Yes    Admitting physician notified: Yes    Wound consult ordered: Yes    Legrand Rams, RN  July 23, 2019  3:41 AM    Second RN/PCT Name:

## 2019-07-23 NOTE — Progress Notes (Signed)
Patient transferred to room 4025 at 1735 via bed.   Hand off report given to [facility]   Patient/ family notified regarding transfer. All belongings sent with patient. Vital signs stable and appropriate for transfer.   Handoff completed with receiving RN at bedside.

## 2019-07-23 NOTE — Plan of Care (Signed)
ICU Nursing Progress Note    [name] is a 64 y.o. female  Admitted 07/20/2019  6:48 PM [facility] day 2)      Major Shift Events:  Uneventful day.  Order to transfer to Med tele    Review of Systems  Neuro:  AOX4, follow commands, generalize weakness, no deficits     Cardiac:  VSS, afebrile, good pulses    Respiratory:  Continue to be on 1L NC sat >92%.  On RN 88 to 90%. Encouraged I.S q1h     GI/GU:  +BS, -BM.  External cath in place     BM this shift? no    Skin Assessment  PD site wound. Wound care NR follow     Pain Control:  Sciatica pain (baseline)      Psycho/Social:    Calm       LDAs  Patient Lines/Drains/Airways Status      Active Lines, Drains and Airways       Name:   Placement date:   Placement time:   Site:   Days:    Permacath Catheter - Tunneled Right   --    --         Peripheral IV 07/22/19 20 G Anterior;Proximal;Right Forearm   07/22/19    0100    Forearm   1    External Urinary Catheter   07/22/19    2000    --   less than 1                        Problem: Pain  Goal: Pain at adequate level as identified by patient  Flowsheets (Taken 07/23/2019 1433)  Pain at adequate level as identified by patient:   Identify patient comfort function goal   Assess for risk of opioid induced respiratory depression, including snoring/sleep apnea. Alert healthcare team of risk factors identified.   Assess pain on admission, during daily assessment and/or before any "as needed" intervention(s)   Reassess pain within 30-60 minutes of any procedure/intervention, per Pain Assessment, Intervention, Reassessment (AIR) Cycle   Evaluate if patient comfort function goal is met   Evaluate patient's satisfaction with pain management progress   Offer non-pharmacological pain management interventions   Consult/collaborate with Pain Service  Note: C/o RT hip pain, Lidoderm placed, scheduled pain med given      Problem: Renal Instability  Goal: Nutritional intake is adequate  Flowsheets (Taken 07/23/2019  1434)  Nutritional intake is adequate:   Monitor daily weights   Assist patient with meals/food selection   Allow adequate time for meals   Encourage/perform oral hygiene as appropriate   Include patient/patient care companion in decisions related to nutrition

## 2019-07-23 NOTE — Progress Notes (Signed)
ICU Daily Progress Note                                                                                                 [phone]    Date Time: 07/23/19 9:46 AM  Patient Name: [name], [name] 64 y.o. female admitted with Shortness of breath  Attending Physician: Quillian Quince, MD  Room: A2701/A2701-01   Admit Date: 07/20/2019  LOS: 2 days    Patient status: Inpatient  Hospital Day: 2                   Assessment:     Shock improved likely hypovolemic  Pericardial effusion no evidence of tamponade  Pneumonia  Anemia  Recent MDR infection  End-stage renal disease  Diabetes mellitus  COPD  Sleep apnea  Hyperlipidemia      Plan:     Hematocrit has stabilized after blood transfusion  Repeat echocardiogram to follow pericardial effusion  Continue antibiotic patient is on meropenem and vancomycin  Dialysis as per nephrology  Follow stool for guaiac  GI prophylaxis patient is on Protonix  ACTH stimulation test normal no evidence of adrenal insufficiency  Continue midodrine      Code Status: full code        Plan of care discussed with ICU nurse, pharmacy, nutritionist and respiratory therapy  Subjective:           Patient is alert awake without any new complaints blood pressure stabilized afebrile no obvious bleeding seen          Medications:      Scheduled Meds: PRN Meds:    atorvastatin, 40 mg, Oral, Daily  collagenase, , Topical, Q24H [place]  gabapentin, 300 mg, Oral, Daily  insulin lispro, 1-6 Units, Subcutaneous, QHS  insulin lispro, 2-10 Units, Subcutaneous, TID AC  lidocaine, 1 patch, Transdermal, Q24H [place]  meropenem, 500 mg, Intravenous, Q8H  midodrine, 10 mg, Oral, TID MEALS  pantoprazole, 40 mg, Oral, QAM AC  sevelamer, 800 mg, Oral, TID MEALS  sodium chloride, 250 mL, Intravenous, Once  sodium hypochlorite, , Irrigation, Q12H  vancomycin, , Intravenous, See Admin Instructions        Continuous Infusions:   calcium GLUConate CRRT Infusion      norepinephrine (LEVOPHED) infusion      prismaSATE 4/2.5       prismaSATE 4/2.5      sodium chloride, , PRN  sodium chloride, , PRN  acetaminophen, 650 mg, Q6H PRN   Or  acetaminophen, 650 mg, Q6H PRN  dextrose, 15 g of glucose, PRN   And  dextrose, 12.5 g, PRN   And  glucagon (rDNA), 1 mg, PRN  magnesium sulfate, 1 g, PRN  melatonin, 3 mg, QHS PRN  naloxone, 0.2 mg, PRN  ondansetron, 4 mg, Q6H PRN   Or  ondansetron, 4 mg, Q6H PRN  oxyCODONE, 10 mg, Q3H PRN  potassium chloride, 10 mEq, PRN  potassium chloride, 20 mEq, PRN  sodium chloride (PF), 10 mL, PRN  sodium phosphate IVPB, 20 mmol, PRN  sodium phosphate IVPB, 30 mmol, PRN  Review of Systems:            General ROS:  Afebrile no weight loss weight gain   ENT/EYE /ROS:  No vision changes, No headache, No sore throat, no nasal discharge   Endocrine ROS: fatigue   Respiratory ROS:  No shortness of breath, wheezing, cough, chest congestion    Cardiovascular ROS:  No chest pain or palpitation, no edema   Gastrointestinal ROS:  No nausea vomiting diarrhea. No anorexia No melanotic stool   Genito-Urinary ROS:  No burning in the urine or hematuria   Musculoskeletal ROS:  No musculoskeletal deformities   Neurological ROS:  No stroke seizure disorder   Dermatological ROS:  No skin rash      Physical Exam:             General appearance - no visible respiratory distress patient does not appear toxic  Mental status -   Alert and oriented x3  Eyes - EOMI No Icterus PERRL  Nose - no nasal discharge  Mouth - mucous membrane is moist.   Neck - no JVD, neck is supple, no  lymphadenopathy or thyromegaly  Chest - clear to auscultation  Heart - S1-S2 RRR no S3-S4 no murmur, sinus  Abdomen -slightly tender abdomen no guarding no rebound neurological - no motor or sensory deficit  Extremities - no edema, pulses 2/4, no  clubbing or cyanosis  Skin - no skin rash  Capillary refill time - <3 sec  Genitourinary -adequate urine output       Data:     Invasive ICU Hemodynamics:                     IBW/kg (Calculated) :  50.1    ABGs:         Invalid input(s): STATS, O2DEL, O2FLO, PRESS, VNTMN, PRSUP        Vent Settings:  IBW:      Patient Lines/Drains/Airways Status    Active PICC Line / CVC Line / PIV Line / Drain / Airway / Intraosseous Line / Epidural Line / ART Line / Line / Wound / Pressure Ulcer / NG/OG Tube     Name:   Placement date:   Placement time:   Site:   Days:    Permacath Catheter - Tunneled Right   --    --         Peripheral IV 07/22/19 20 G Anterior;Proximal;Right Forearm   07/22/19    0100    Forearm   1    External Urinary Catheter   07/22/19    2000    --   less than 1    Wound 05/27/19 Surgical Incision Abdomen Other (Comment) 4x4, abd, tape    05/27/19    2349    Abdomen   56    Wound 07/06/19 Thigh Right [place], hard, and a little open area   07/06/19    1000    Thigh   16    Wound 07/08/19 Moisture Associated Skin Damage (MASD) Perineum   07/08/19    1038    Perineum   14              VITAL SIGNS   Temp:  [97.8 F (36.6 C)-98.9 F (37.2 C)] 98.6 F (37 C)  Heart Rate:  [68-80] 71  Resp Rate:  [11-38] 25  BP: (93-131)/(48-72) 110/58  Pulse ox:    Intake/Output Summary (Last 24 hours) at 07/23/2019 0946  Last data  filed at 07/23/2019 0400  Gross per 24 hour   Intake 1074 ml   Output 120 ml   Net 954 ml        Labs:     CBC w/Diff CMP   Recent Labs   Lab 07/23/19  0444 07/22/19  1542 07/22/19  0848 07/22/19  0411 07/22/19  0411 07/21/19  0858 07/21/19  0858   WBC 7.88 11.07* 8.20  More results in Results Review 7.91  More results in Results Review 10.79*   Hgb 7.7* 6.8* 5.7*  More results in Results Review 5.7*  More results in Results Review 7.6*   Hematocrit 24.8* 21.8* 18.8*  More results in Results Review 19.3*  More results in Results Review 25.5*   Platelets 179 155 159  More results in Results Review 161  More results in Results Review 175   MCV 91.5 90.1 91.3  More results in Results Review 91.5  More results in Results Review 91.4   Neutrophils 68.9  --   --   --  73.2  --  73.1   More results in  Results Review = values in this interval not displayed.       PT/INR         Recent Labs   Lab 07/23/19  0444 07/22/19  0335 07/22/19  0031 07/21/19  0858 07/20/19  2052 07/18/19  0335 07/18/19  0335   Sodium 137 137 135*  More results in Results Review 133*  More results in Results Review 136   Potassium 4.3 5.0 5.0  More results in Results Review 6.2*  More results in Results Review 5.3*   Chloride 100 98* 96*  More results in Results Review 95*  More results in Results Review 98*   CO2 22 21* 20*  More results in Results Review 23  More results in Results Review 23   BUN 57.0* 96.0* 96.0*  More results in Results Review 91.0*  More results in Results Review 73.0*   Creatinine 6.1* 9.7* 9.5*  More results in Results Review 9.1*  More results in Results Review 7.3*   Glucose 164* 139* 154*  More results in Results Review 212*  More results in Results Review 141*   Calcium 8.9 8.1* 8.7  More results in Results Review 8.9  More results in Results Review 8.8   Magnesium 2.2 2.3  --   --   --   --  2.1   Phosphorus 6.1* 8.7*  --   --   --   --  5.5*   Protein, Total  --   --   --   --  7.5  --   --    Albumin 3.3* 3.3*  --   --  2.8*  More results in Results Review 2.5*   AST (SGOT)  --   --   --   --  10  --   --    ALT  --   --   --   --  11  --   --    Alkaline Phosphatase  --   --   --   --  109*  --   --    Bilirubin, Total  --   --   --   --  0.3  --   --    More results in Results Review = values in this interval not displayed.      Glucose POCT   Recent Labs   Lab 07/23/19  6144 07/22/19  0335 07/22/19  0031 07/21/19  2003 07/21/19  1250 07/21/19  0858 07/20/19  2052   Glucose 164* 139* 154* 164* 213* 210* 212*        Recent Labs   Lab 07/21/19  1058 07/21/19  0858 07/20/19  2052   Troponin I 0.01 <0.01 <0.01       Urinalysis        Invalid input(s): LEUKOCYTESUR    Rads:   No results found.        I have personally reviewed the patients history and 24 hour interval events, along with vitals, labs, radiology  images .    So far today I have spent _35__ minutes providing care for this patient excluding teaching and billable procedures, and not overlapping with any other providers.:    Quillian Quince MD   07/23/19 9:46 AM

## 2019-07-23 NOTE — Progress Notes (Signed)
[facility] PROGRESS NOTE  Aaron Edelman, x [phone] [facility] Spectralink)      Date Time: 07/23/19 5:09 PM  Patient Name: [name]  Attending Physician: Lisa Roca, MD    CC: follow-up ESRD, provision of hemodialysis    Assessment:     1. ESRD on HD MWF at [facility], Dr. Aline August  - [facility] on PD  2. Hyperkalemia - better  3. Hypotension - on midodrine  4. Anemia in CKD  5. Mild metabolic acidosis    Recommendations:     1. HD tomorrow  2. Transfuse on HD tomorrow as needed  3. Start aranesp  4. Fluid removal  5. abx    Case discussed with:     Ayesha Mohair, MD, [facility]  [phone] (office)  X [phone] (FF[facility] Spectra-Link)      Subjective: no new events    Review of Systems:   Cardio: chest pain, no edema  Pulm: no SOB  GI: no abdominal pain      Physical Exam:     Vitals:    07/23/19 1200 07/23/19 1300 07/23/19 1400 07/23/19 1600   BP: 130/57 120/59 128/58    Pulse: 79 77 74    Resp: (!) 29 (!) 30 (!) 29    Temp: 98.6 F (37 C)   98.7 F (37.1 C)   TempSrc: Axillary   Axillary   SpO2: 95% 95% 96%    Weight:       Height:           Intake and Output Summary (Last 24 hours) at Date Time    Intake/Output Summary (Last 24 hours) at 07/23/2019 1709  Last data filed at 07/23/2019 0837  Gross per 24 hour   Intake 955 ml   Output 120 ml   Net 835 ml       General: awake, alert, oriented x 3, no acute distress.  Cardiovascular: regular rate and rhythm  Lungs: clear to auscultation bilaterally, bilateral air entry, normal work of breathing  Abdomen: soft, NT, ND  Extremities: no edema    Access:  R IJ HDTC    Meds:      Scheduled Meds: PRN Meds:    atorvastatin, 40 mg, Oral, Daily  collagenase, , Topical, Q24H [facility]  darbepoetin alfa, 60 mcg, Subcutaneous, Weekly  gabapentin, 300 mg, Oral, Daily  insulin lispro, 1-6 Units, Subcutaneous, QHS  insulin lispro, 2-10 Units, Subcutaneous, TID AC  lactobacillus/streptococcus, 1 capsule, Oral, Daily  lidocaine, 1 patch, Transdermal, Q24H  [facility]  meropenem, 500 mg, Intravenous, Q8H  midodrine, 10 mg, Oral, TID MEALS  pantoprazole, 40 mg, Oral, QAM AC  sevelamer, 800 mg, Oral, TID MEALS  sodium chloride, 250 mL, Intravenous, Once  sodium hypochlorite, , Irrigation, Q12H  vancomycin, , Intravenous, See Admin Instructions          Continuous Infusions:   sodium chloride, , PRN  sodium chloride, , PRN  acetaminophen, 650 mg, Q6H PRN   Or  acetaminophen, 650 mg, Q6H PRN  dextrose, 15 g of glucose, PRN   And  dextrose, 12.5 g, PRN   And  glucagon (rDNA), 1 mg, PRN  melatonin, 3 mg, QHS PRN  naloxone, 0.2 mg, PRN  ondansetron, 4 mg, Q6H PRN   Or  ondansetron, 4 mg, Q6H PRN  oxyCODONE, 10 mg, Q3H PRN              Labs:     Recent Labs   Lab 07/23/19  0444 07/22/19  1542 07/22/19  0848   WBC 7.88 11.07* 8.20   Hgb 7.7* 6.8* 5.7*   Hematocrit 24.8* 21.8* 18.8*   Platelets 179 155 159     Recent Labs   Lab 07/23/19  0444 07/22/19  0335 07/22/19  0031 07/21/19  0858 07/20/19  2052 07/18/19  0335 07/18/19  0335   Sodium 137 137 135*  More results in Results Review 133*  More results in Results Review 136   Potassium 4.3 5.0 5.0  More results in Results Review 6.2*  More results in Results Review 5.3*   Chloride 100 98* 96*  More results in Results Review 95*  More results in Results Review 98*   CO2 22 21* 20*  More results in Results Review 23  More results in Results Review 23   BUN 57.0* 96.0* 96.0*  More results in Results Review 91.0*  More results in Results Review 73.0*   Creatinine 6.1* 9.7* 9.5*  More results in Results Review 9.1*  More results in Results Review 7.3*   Calcium 8.9 8.1* 8.7  More results in Results Review 8.9  More results in Results Review 8.8   Albumin 3.3* 3.3*  --   --  2.8*  More results in Results Review 2.5*   Phosphorus 6.1* 8.7*  --   --   --   --  5.5*   Magnesium 2.2 2.3  --   --   --   --  2.1   Glucose 164* 139* 154*  More results in Results Review 212*  More results in Results Review 141*   EGFR 8.4 4.9 5.0  More results in  Results Review 5.3  More results in Results Review 6.8   More results in Results Review = values in this interval not displayed.     : No results found.        Signed by: Ayesha Mohair, MD, FASN

## 2019-07-23 NOTE — Progress Notes (Signed)
INFECTIOUS DISEASES PROGRESS NOTE    Date Time: 07/23/19 11:00 AM  Patient Name: [name],[name]  Patient status.Inpatient  Hospital Day: 2    Assessment and Plan:   1. Hypotension  Resolved with  volume  2. Abd wound with MRSA, enterococcus, and pseudomonas  Repeat gram stain negative  PLAN: 1. Check wound C&S, if neg Andrews antibiotics    Subjective:   Non toxic    Review of Systems:   Review of Systems -  Chest pain, SOB      Antibiotics:   Day 3    Other medications reviewed in EPIC.  Central Access:       Physical Exam:     Vitals:    07/23/19 1000   BP: 133/61   Pulse: 72   Resp: (!) 26   Temp:    SpO2: (!) 86%       General appearance - in mild to moderate distress  Chest - clear, slightly decreased on left  Heart - normal rate, regular rhythm, normal S1, S2, no murmurs, rubs, clicks or gallops  Wound has green base  Labs:     Microbiology Results     Procedure Component Value Units Date/Time    Blood Culture Aerobic/Anaerobic #1 [[id number]] Collected: 07/20/19 2052    Specimen: Arm from Blood, Venipuncture Updated: 07/22/19 0221    Narrative:      ORDER#: [id number]                                    ORDERED BY: Randolm Idol  SOURCE: Blood, Venipuncture arm                      COLLECTED:  07/20/19 20:52  ANTIBIOTICS AT COLL.:                                RECEIVED :  07/20/19 21:25  Culture Blood Aerobic and Anaerobic        PRELIM      07/22/19 02:21  07/22/19   No Growth after 1 day/s of incubation.      Blood Culture Aerobic/Anaerobic #2 [[id number]] Collected: 07/20/19 2052    Specimen: Arm from Blood, Venipuncture Updated: 07/22/19 0221    Narrative:      ORDER#: [id number]                                    ORDERED BY: Randolm Idol  SOURCE: Blood, Venipuncture arm                      COLLECTED:  07/20/19 20:52  ANTIBIOTICS AT COLL.:                                RECEIVED :  07/20/19 21:25  Culture Blood Aerobic and Anaerobic        PRELIM      07/22/19 02:21  07/22/19   No Growth after 1 day/s of  incubation.      COVID-19 (SARS-COV-2) [employer] [facility]) [[id number]] Collected: 07/21/19 0152    Specimen: Nasopharyngeal Swab from Nasopharynx Updated: 07/21/19 0324     Purpose of COVID testing Screening     SARS-CoV-2 Specimen Source Nasopharyngeal  SARS CoV 2 Overall Result Negative     Comment: Test performed using the Abbott ID NOW EUA assay.  Please see Fact Sheets for patients and providers located at:  http://olson-hall.info/    This test is for the qualitative detection of SARS-CoV-2  (COVID19) nucleic acid. Viral nucleic acids may persist in vivo,  independent of viability. Detection of viral nucleic acid does  not imply the presence of infectious virus, or that virus  nucleic acid is the cause of clinical symptoms. Negative  results should be treated as presumptive and, if inconsistent  with clinical signs and symptoms or necessary for patient  management, should be tested with an alternative molecular  assay. Negative results do not preclude SARS-CoV-2 infection  and should not be used as the sole basis for patient  management decisions. Invalid results may be due to inhibiting  substances in the specimen and recollection should occur.         Narrative:      o Collect and clearly label specimen type:  o Upper respiratory specimen: One Nasopharyngeal Dry Swab NO  Transport Media.  o Hand deliver to laboratory ASAP  Indication for testing->Extended care facility admission to  semi private room  Screening    MRSA culture - Nares [[id number]] Collected: 07/21/19 1058    Specimen: Culturette from Nares Updated: 07/21/19 1511    MRSA culture - Throat [[id number]] Collected: 07/21/19 1058    Specimen: Culturette from Throat Updated: 07/21/19 1511          CBC w/Diff CMP   Recent Labs   Lab 07/23/19  0444 07/22/19  1542 07/22/19  0848 07/22/19  0411 07/22/19  0411 07/21/19  0858 07/21/19  0858   WBC 7.88 11.07* 8.20  More results in Results Review 7.91  More results in Results Review 10.79*   Hgb  7.7* 6.8* 5.7*  More results in Results Review 5.7*  More results in Results Review 7.6*   Hematocrit 24.8* 21.8* 18.8*  More results in Results Review 19.3*  More results in Results Review 25.5*   Platelets 179 155 159  More results in Results Review 161  More results in Results Review 175   MCV 91.5 90.1 91.3  More results in Results Review 91.5  More results in Results Review 91.4   Neutrophils 68.9  --   --   --  73.2  --  73.1   More results in Results Review = values in this interval not displayed.       PT/INR         Recent Labs   Lab 07/23/19  0444 07/22/19  0335 07/22/19  0031 07/21/19  0858 07/20/19  2052 07/18/19  0335 07/18/19  0335   Sodium 137 137 135*  More results in Results Review 133*  More results in Results Review 136   Potassium 4.3 5.0 5.0  More results in Results Review 6.2*  More results in Results Review 5.3*   Chloride 100 98* 96*  More results in Results Review 95*  More results in Results Review 98*   CO2 22 21* 20*  More results in Results Review 23  More results in Results Review 23   BUN 57.0* 96.0* 96.0*  More results in Results Review 91.0*  More results in Results Review 73.0*   Creatinine 6.1* 9.7* 9.5*  More results in Results Review 9.1*  More results in Results Review 7.3*   Glucose 164* 139* 154*  More results in Results Review 212*  More results in Results Review 141*   Calcium 8.9 8.1* 8.7  More results in Results Review 8.9  More results in Results Review 8.8   Magnesium 2.2 2.3  --   --   --   --  2.1   Phosphorus 6.1* 8.7*  --   --   --   --  5.5*   Protein, Total  --   --   --   --  7.5  --   --    Albumin 3.3* 3.3*  --   --  2.8*  More results in Results Review 2.5*   AST (SGOT)  --   --   --   --  10  --   --    ALT  --   --   --   --  11  --   --    Alkaline Phosphatase  --   --   --   --  109*  --   --    Bilirubin, Total  --   --   --   --  0.3  --   --    More results in Results Review = values in this interval not displayed.      Glucose POCT   Recent Labs   Lab  07/23/19  0444 07/22/19  0335 07/22/19  0031 07/21/19  2003 07/21/19  1250 07/21/19  0858 07/20/19  2052   Glucose 164* 139* 154* 164* 213* 210* 212*          Rads:     Radiology Results (24 Hour)     ** No results found for the last 24 hours. **            Signed by: Ernst Breach

## 2019-07-23 NOTE — Progress Notes (Signed)
Progress Note- Pharmacy Vancomycin Dosing Consult:  Day 3  Vancomycin Indication: Abdominal infection   Vancomycin Target Trough Level:                   15 - 25 mg/L pre-dialysis for hemodialysis patients      Pertinent Cultures:     Date Source  Organism & Pertinent Susceptibilities     06/09 Abd wound P.aeruginosa  and  MRSA    06/13 Abscess groin MRSA   and  E.faecalis      06/27 Bcx (2/2) Day 1 NGTD   06/28  MRSA Nares & Throat  Negative    06/29  Wound cx INGTD           Serum creatinine: 6.1 mg/dL (H) 07/23/19 0444  Estimated creatinine clearance: 10.8 mL/min (A)    Baseline SCr: on HD MWF  Nephrotoxic drugs administered in the past 48 hours: None    Assessment  Measured Vancomycin Level: random with AM labs came back 38.6 ( supra-therapeutic)  Recent Labs   Lab 07/23/19  0444   Vancomycin Random 38.6         Recommendations:  Will Hold Vancomycin dose today due to supra-therapeutic level.   Random level ordered with am labs on 07/02 and will dose accordingly after HD session.     Thank you,  Preciosa Bundrick

## 2019-07-24 LAB — GLUCOSE WHOLE BLOOD - POCT
Whole Blood Glucose POCT: 171 mg/dL — ABNORMAL HIGH (ref 70–100)
Whole Blood Glucose POCT: 249 mg/dL — ABNORMAL HIGH (ref 70–100)
Whole Blood Glucose POCT: 319 mg/dL — ABNORMAL HIGH (ref 70–100)
Whole Blood Glucose POCT: 213 mg/dL — ABNORMAL HIGH (ref 70–100)
Whole Blood Glucose POCT: 142 mg/dL — ABNORMAL HIGH (ref 70–100)
Whole Blood Glucose POCT: 154 mg/dL — ABNORMAL HIGH (ref 70–100)
Whole Blood Glucose POCT: 195 mg/dL — ABNORMAL HIGH (ref 70–100)
Whole Blood Glucose POCT: 205 mg/dL — ABNORMAL HIGH (ref 70–100)

## 2019-07-24 MED ORDER — ALBUMIN HUMAN 25 % IV SOLN
100.0000 mL | INTRAVENOUS | Status: AC | PRN
Start: 2019-07-24 — End: 2019-07-24

## 2019-07-24 MED ORDER — SODIUM CHLORIDE 0.9 % IV BOLUS
100.0000 mL | INTRAVENOUS | Status: AC | PRN
Start: 2019-07-24 — End: 2019-07-24

## 2019-07-24 MED ORDER — SODIUM CHLORIDE 0.9 % IV BOLUS
250.0000 mL | INTRAVENOUS | Status: AC | PRN
Start: 2019-07-24 — End: 2019-07-24

## 2019-07-24 MED ORDER — DOXYCYCLINE MONOHYDRATE 100 MG PO CAPS
100.00 mg | ORAL_CAPSULE | Freq: Two times a day (BID) | ORAL | Status: DC
Start: 2019-07-24 — End: 2019-07-31
  Administered 2019-07-24 – 2019-07-31 (×15): 100 mg via ORAL
  Filled 2019-07-24 (×15): qty 1

## 2019-07-24 NOTE — Progress Notes (Signed)
3.5Hrs. HD tx. Done; Net fluid off 2.4L; Rt. CVC -w/ fair flow; max. BFR achieved 300 w/ lines reversed. VSS; Rt. Arm pain remain unresolved post HD. Report given to Charlynne Pander RN     07/24/19 1815   Treatment Summary   Time Off Machine 1800   Duration of Treatment (Hours) 3.5   Treatment Type 1:1   Dialyzer Clearance Heavily streaked   Fluid Volume Off (mL) 2900   Prime Volume (mL) 200   Rinseback Volume (mL) 200   Fluid Given: Normal Saline (mL) 100   Fluid Given: PRBC  0 mL   Fluid Given: Albumin (mL) 0   Fluid Given: Other (mL) 0   Total Fluid Given 500   Hemodialysis Net Fluid Removed 2400   Post Treatment Assessment   Post-Treatment Weight (Kg) -2.4   Patient Response to Treatment tolerated   Additional Dialyzer Used 0   Vitals   Temp 98.2 F (36.8 C)   Resp Rate 18   BP 145/80   SpO2 99 %   O2 Device Nasal cannula   Assessment   Mental Status Alert;Oriented;Cooperative   Cardiac (WDL) X   Cardiac Regularity Regular   Cardiac Symptoms None   Cardiac Rhythm Normal Sinus Rhythm   Respiratory  WDL   Respiratory Pattern Regular   Bilateral Breath Sounds Clear;Diminished   R Breath Sounds Clear;Diminished   L Breath Sounds Clear;Diminished   Cough Non-productive   Edema  WDL   Generalized Edema Non Pitting Edema   RLE Edema +1   LLE Edema +1   General Skin Color Pale   Skin Condition/Temp Warm;Dry   Gastrointestinal (WDL) WDL   Abdomen Inspection Nondistended   GI Symptoms Loss of appetite   Mobility Bed   Pain Assessment   Charting Type Reassessment   Pain Scale Used Numeric Scale (0-10)   Numeric Pain Scale   Pain Score 7   POSS Score 2   Pain Location Arm   Pain Orientation Right   Pain Descriptors Aching   Pain Frequency Intermittent   Effect of Pain on Daily Activities moderate   Patient's Stated Comfort Functional Goal 2   Pain Intervention(s) Medication   Multiple Pain Sites No   Education   Person taught Patient   Knowledge basis Minimal   Topics taught Access care;Fluid Management   Teaching Tools  Explain   Reponse Verbalizes Understanding   Bedside Nurse Communication   Name of bedside RN - post dialysis Charlynne Pander

## 2019-07-24 NOTE — Progress Notes (Signed)
Pt. Alert,oriented x3; c/o pain on her rt. Arm; On O2 via NC @ 2l/min; Rt. CVC a/v ports w/ resistance on aspiration; flushed well; lines reversed to achieve a good flow.VSS.    07/24/19 1405   Bedside Nurse Communication   Name of bedside RN - pre dialysis Melissa Lord   Treatment Initiation- With Dialysis Precautions   Time Out/Safety Check Completed Yes   Consent for HD signed for this hospitalization (Date) 05/23/19   Preferred language No   Blood Consent Verified N/A   Dialysis Precautions All Connections Secured;Saline Line Double Clamped   Dialysis Treatment Type Routine;Bedside   Is patient diabetic? Yes   RO/Hemodialysis Architectural technologist   Is Total Chlorine less than 0.1 ppm? Yes   Orignial Total Chlorine Testing Time 1345   At 4 Hour Total Chlorine Testing Time 1745   RO/Hemodialysis Facilities manager Number 7    Machine Serial Number   [id number])   RO # 25   RO Serial # [id number]   Water Hardness NA   pH 7.2   Pressure Test Verified Yes   Alarms Verified Passed   Machine Temperature 97.7 F (36.5 C)   Alarms Verified Yes   Na+ mEq (Machine) 138 mEq   Bicarb mEq (Machine) 35 mEq   Hemodialysis Conductivity (Machine) 15.4   Hemodialysis Conductivity (Meter) 14   Dialyzer Lot Number [id number]   Tubing Lot Number [id number]   RO Machine Log Completed Yes   Hepatitis Status   HBsAg (Antigen) Result Negative   HBsAg Date Drawn 07/10/19   HBsAg Repeat Draw Due Date 08/07/19   Dialysis Weight   Pre-Treatment Weight (Kg) 112.1   Scale Type ICU Bed Scale   Vitals   Temp 97.2 F (36.2 C)   Resp Rate 20   BP 122/69   SpO2 95 %   O2 Device Nasal cannula   Assessment   Mental Status Alert;Oriented;Cooperative   Cardiac (WDL) X   Cardiac Regularity Regular   Cardiac Symptoms None   Cardiac Rhythm Normal Sinus Rhythm   Respiratory  WDL   Respiratory Pattern Regular   Bilateral Breath Sounds Clear;Diminished   R Breath Sounds Clear;Diminished   L Breath Sounds Clear;Diminished   Cough Non-productive    Edema  WDL   Generalized Edema Non Pitting Edema   RLE Edema +1   LLE Edema +1   General Skin Color Pale   Skin Condition/Temp Warm   Gastrointestinal (WDL) WDL   Abdomen Inspection Nondistended   GI Symptoms None   Mobility Bed   Pain Assessment   Charting Type Assessment   Pain Scale Used Numeric Scale (0-10)   Numeric Pain Scale   Pain Score 0   POSS Score 1   Pain Location Generalized   Pain Descriptors Aching   Hemodialysis Comments   Pre-Hemodialysis Comments Timeout safety check- done   Hemodialysis History Information   Outpatient Dialysis Unit [facility]   Chronic Dialysis Patient Yes   Outpatient Nephrologist Dr. Eunice Blase

## 2019-07-24 NOTE — Progress Notes (Signed)
[facility] PROGRESS NOTE  Aaron Edelman, x [phone] ([place])      Date Time: 07/24/19 5:52 AM  Patient Name: [name]  Attending Physician: Lisa Roca, MD    CC: follow-up ESRD, provision of hemodialysis    Assessment:     1. ESRD on HD MWF at [facility], Dr. Aline August  2. Hyperkalemia - better  3. Hypotension - on midodrine  4. Anemia in CKD  5. Mild metabolic acidosis    Recommendations:     We will plan for continued UF with dialysis  Next dialysis planned for tomorrow    Case discussed with:     Armando Reichert, MD  [facility]  [phone] (office)  X [phone] (FFX Spectra-Link)      Subjective: Drowsy    Review of Systems:   Cardio: chest pain, no edema  Pulm: no SOB  GI: no abdominal pain      Physical Exam:     Vitals:    07/23/19 1757 07/23/19 2021 07/23/19 2300 07/24/19 0417   BP: 126/72 117/67 117/64 130/58   Pulse: 72 71 68 78   Resp: (!) 2 22 18 18    Temp: 98.2 F (36.8 C) 98.1 F (36.7 C) 98.3 F (36.8 C) 99.5 F (37.5 C)   TempSrc: Oral Oral Oral Oral   SpO2: 91% 93% 95% 92%   Weight:       Height:           Intake and Output Summary (Last 24 hours) at Date Time    Intake/Output Summary (Last 24 hours) at 07/24/2019 0552  Last data filed at 07/24/2019 0151  Gross per 24 hour   Intake 200 ml   Output --   Net 200 ml       General: awake, alert, oriented x 3, no acute distress.  Cardiovascular: regular rate and rhythm  Lungs: clear to auscultation bilaterally, bilateral air entry, normal work of breathing  Abdomen: soft, NT, ND  Extremities: no edema    Access:  R IJ HDTC    Meds:      Scheduled Meds: PRN Meds:    atorvastatin, 40 mg, Oral, Daily  collagenase, , Topical, Q24H SCH  darbepoetin alfa, 60 mcg, Subcutaneous, Weekly  gabapentin, 300 mg, Oral, Daily  insulin lispro, 1-6 Units, Subcutaneous, QHS  insulin lispro, 2-10 Units, Subcutaneous, TID AC  lactobacillus/streptococcus, 1 capsule, Oral, Daily  lidocaine, 1 patch, Transdermal, Q24H  SCH  meropenem, 500 mg, Intravenous, Q8H  midodrine, 10 mg, Oral, TID MEALS  pantoprazole, 40 mg, Oral, QAM AC  sevelamer, 800 mg, Oral, TID MEALS  sodium chloride, 250 mL, Intravenous, Once  sodium hypochlorite, , Irrigation, Q12H  vancomycin, , Intravenous, See Admin Instructions          Continuous Infusions:   sodium chloride, , PRN  sodium chloride, , PRN  acetaminophen, 650 mg, Q6H PRN   Or  acetaminophen, 650 mg, Q6H PRN  dextrose, 15 g of glucose, PRN   And  dextrose, 12.5 g, PRN   And  glucagon (rDNA), 1 mg, PRN  melatonin, 3 mg, QHS PRN  naloxone, 0.2 mg, PRN  ondansetron, 4 mg, Q6H PRN   Or  ondansetron, 4 mg, Q6H PRN  oxyCODONE, 10 mg, Q3H PRN              Labs:     Recent Labs   Lab 07/23/19  0444 07/22/19  1542 07/22/19  0848   WBC 7.88 11.07* 8.20  Hgb 7.7* 6.8* 5.7*   Hematocrit 24.8* 21.8* 18.8*   Platelets 179 155 159     Recent Labs   Lab 07/23/19  0444 07/22/19  0335 07/22/19  0031 07/21/19  0858 07/20/19  2052 07/18/19  0335 07/18/19  0335   Sodium 137 137 135*  More results in Results Review 133*  More results in Results Review 136   Potassium 4.3 5.0 5.0  More results in Results Review 6.2*  More results in Results Review 5.3*   Chloride 100 98* 96*  More results in Results Review 95*  More results in Results Review 98*   CO2 22 21* 20*  More results in Results Review 23  More results in Results Review 23   BUN 57.0* 96.0* 96.0*  More results in Results Review 91.0*  More results in Results Review 73.0*   Creatinine 6.1* 9.7* 9.5*  More results in Results Review 9.1*  More results in Results Review 7.3*   Calcium 8.9 8.1* 8.7  More results in Results Review 8.9  More results in Results Review 8.8   Albumin 3.3* 3.3*  --   --  2.8*  More results in Results Review 2.5*   Phosphorus 6.1* 8.7*  --   --   --   --  5.5*   Magnesium 2.2 2.3  --   --   --   --  2.1   Glucose 164* 139* 154*  More results in Results Review 212*  More results in Results Review 141*   EGFR 8.4 4.9 5.0  More results in  Results Review 5.3  More results in Results Review 6.8   More results in Results Review = values in this interval not displayed.     : No results found.        Signed by: Armando Reichert, MD, FASN

## 2019-07-24 NOTE — Plan of Care (Signed)
Patient is awake, A&OX4, denies pain, follows commands.    Abdominal dressing clean dry and intact.  Dressing changed per order.    Plan pain management , UA, Occult stool, daily labs and ECHO.  Problem: Moderate/High Fall Risk Score >5  Goal: Patient will remain free of falls  Outcome: Progressing  Flowsheets (Taken 07/23/2019 2200)  High (Greater than 13):   HIGH-Apply yellow "Fall Risk" arm band   HIGH-Bed alarm on at all times while patient in bed   MOD-Place Fall Risk level on whiteboard in room   MOD-Include family in multidisciplinary POC discussions   MOD-Request PT/OT consult order for patients with gait/mobility impairment   MOD-Use of assistive devices -Bedside Commode if appropriate   MOD-Remain with patient during toileting   LOW-Fall Interventions Appropriate for Low Fall Risk     Problem: Safety  Goal: Patient will be free from injury during hospitalization  Outcome: Progressing  Flowsheets (Taken 07/23/2019 1908 by Charlynne Pander, RN)  Patient will be free from injury during hospitalization:   Assess patient's risk for falls and implement fall prevention plan of care per policy   Provide and maintain safe environment   Use appropriate transfer methods   Ensure appropriate safety devices are available at the bedside   Include patient/ family/ care giver in decisions related to safety     Problem: Safety  Goal: Patient will be free from infection during hospitalization  Outcome: Progressing  Flowsheets (Taken 07/23/2019 1908 by Charlynne Pander, RN)  Free from Infection during hospitalization:   Assess and monitor for signs and symptoms of infection   Monitor all insertion sites (i.e. indwelling lines, tubes, urinary catheters, and drains)   Monitor lab/diagnostic results   Encourage patient and family to use good hand hygiene technique     Problem: Pain  Goal: Pain at adequate level as identified by patient  Outcome: Progressing  Flowsheets (Taken 07/23/2019 1433 by Man, Ubaldo Glassing, RN)  Pain at adequate level  as identified by patient:   Identify patient comfort function goal   Assess for risk of opioid induced respiratory depression, including snoring/sleep apnea. Alert healthcare team of risk factors identified.   Assess pain on admission, during daily assessment and/or before any "as needed" intervention(s)   Reassess pain within 30-60 minutes of any procedure/intervention, per Pain Assessment, Intervention, Reassessment (AIR) Cycle   Evaluate if patient comfort function goal is met   Evaluate patient's satisfaction with pain management progress   Offer non-pharmacological pain management interventions   Consult/collaborate with Pain Service     Problem: Side Effects from Pain Analgesia  Goal: Patient will experience minimal side effects of analgesic therapy  Outcome: Progressing  Flowsheets (Taken 07/22/2019 1849 by Willodean Rosenthal, RN)  Patient will experience minimal side effects of analgesic therapy:   Monitor/assess patient's respiratory status (RR depth, effort, breath sounds)   Assess for changes in cognitive function   Prevent/manage side effects per LIP orders (i.e. nausea, vomiting, pruritus, constipation, urinary retention, etc.)   Evaluate for opioid-induced sedation with appropriate assessment tool (i.e. POSS)     Problem: Psychosocial and Spiritual Needs  Goal: Demonstrates ability to cope with hospitalization/illness  Outcome: Progressing  Flowsheets (Taken 07/22/2019 1849 by Willodean Rosenthal, RN)  Demonstrates ability to cope with hospitalizations/illness:   Encourage verbalization of feelings/concerns/expectations   Provide quiet environment   Assist patient to identify own strengths and abilities   Encourage patient to set small goals for self   Encourage participation in diversional activity   Reinforce  positive adaptation of new coping behaviors   Include patient/ patient care companion in decisions   Communicate referral to spiritual care as appropriate     Problem: Compromised Tissue integrity  Goal: Damaged  tissue is healing and protected  Outcome: Progressing  Flowsheets (Taken 07/23/2019 1908 by Charlynne Pander, RN)  Damaged tissue is healing and protected:   Monitor/assess Braden scale every shift   Reposition patient every 2 hours and as needed unless able to reposition self   Relieve pressure to bony prominences for patients at moderate and high risk   Provide wound care per wound care algorithm   Keep intact skin clean and dry   Avoid shearing injuries     Problem: Compromised Tissue integrity  Goal: Nutritional status is improving  Outcome: Progressing  Flowsheets (Taken 07/22/2019 1849 by Willodean Rosenthal, RN)  Nutritional status is improving:   Assist patient with eating   Allow adequate time for meals   Encourage patient to take dietary supplement(s) as ordered   Collaborate with Clinical Nutritionist   Include patient/patient care companion in decisions related to nutrition     Problem: Renal Instability  Goal: Fluid and electrolyte balance are achieved/maintained  Outcome: Progressing  Flowsheets (Taken 07/22/2019 1849 by Willodean Rosenthal, RN)  Fluid and electrolyte balance are achieved/maintained:   Monitor intake and output every shift   Monitor/assess lab values and report abnormal values   Provide adequate hydration   Monitor daily weight   Assess for confusion/personality changes   Assess and reassess fluid and electrolyte status   Observe for seizure activity and initiate seizure precautions if indicated   Observe for cardiac arrhythmias   Monitor for muscle weakness   Follow fluid restrictions/IV/PO parameters     Problem: Renal Instability  Goal: Nutritional intake is adequate  Outcome: Progressing  Flowsheets (Taken 07/23/2019 1434 by Man, [place], RN)  Nutritional intake is adequate:   Monitor daily weights   Assist patient with meals/food selection   Allow adequate time for meals   Encourage/perform oral hygiene as appropriate   Include patient/patient care companion in decisions related to nutrition      Problem: Patient Receiving Advanced Renal Therapies  Goal: Therapy access site remains intact  Outcome: Progressing  Flowsheets (Taken 07/22/2019 1849 by Willodean Rosenthal, RN)  Therapy access site remains intact:   Assess therapy access site   Change therapy access site dressing as needed

## 2019-07-24 NOTE — Plan of Care (Signed)
Problem: Renal Instability  Goal: Fluid and electrolyte balance are achieved/maintained  Flowsheets (Taken 07/24/2019 1850)  Fluid and electrolyte balance are achieved/maintained:   Monitor intake and output every shift   Provide adequate hydration   Assess for confusion/personality changes   Observe for seizure activity and initiate seizure precautions if indicated   Observe for cardiac arrhythmias   Assess and reassess fluid and electrolyte status   Follow fluid restrictions/IV/PO parameters   Monitor daily weight   Monitor/assess lab values and report abnormal values  Goal: Nutritional intake is adequate  Flowsheets (Taken 07/24/2019 1850)  Nutritional intake is adequate:   Monitor daily weights   Assist patient with meals/food selection   Encourage/administer dietary supplements as ordered (i.e. tube feed, TPN, oral, OGT/NGT, supplements)   Consult/collaborate with Clinical Nutritionist   Assess anorexia, appetite, and amount of meal/food tolerated   Include patient/patient care companion in decisions related to nutrition     Problem: Patient Receiving Advanced Renal Therapies  Goal: Therapy access site remains intact  Flowsheets (Taken 07/24/2019 1850)  Therapy access site remains intact:   Assess therapy access site   Change therapy access site dressing as needed

## 2019-07-24 NOTE — Plan of Care (Signed)
Pt is alert and oriented x 4.  Pt c/o of pain today and received oxycodone x 2 with good effect.  Pt received hemodialysis today - 2L removed.  We love IV access for much of the day - it was reestablished in the late afternoon.  MD made aware - ordered PO antibiotics.    Pt can appear not to see/hear/react to a care giver.  When this happens, one may take vitals to establish that the pt is okay and apply cool water to face.  She quickly looks directly at the care giver and responds.    Pt is high fall risk.  Bed alarm is on.      Problem: Safety  Goal: Patient will be free from injury during hospitalization  Outcome: Progressing  Goal: Patient will be free from infection during hospitalization  Outcome: Progressing     Problem: Pain  Goal: Pain at adequate level as identified by patient  Outcome: Progressing     Problem: Psychosocial and Spiritual Needs  Goal: Demonstrates ability to cope with hospitalization/illness  Outcome: Progressing     Problem: Compromised Tissue integrity  Goal: Damaged tissue is healing and protected  Outcome: Progressing     Problem: Renal Instability  Goal: Fluid and electrolyte balance are achieved/maintained  Outcome: Progressing

## 2019-07-24 NOTE — Progress Notes (Signed)
[facility]  PROGRESS NOTE      Patient: [name]  Date: 07/24/2019   LOS: 3 Days  Admission Date: 07/20/2019   MRN: [id number]  Attending: Lisa Roca  Please contact me on the following Spectralink [phone]       ASSESSMENT/PLAN     [name] is a 64 y.o. female admitted with Shortness of breath    Interval Summary:     Patient Active Hospital Problem List:  CHF/fluid overload.  Patient is status post dialysis and ultrafiltration today.  Repeat dialysis and ultrafiltration in a.m.    End-stage renal disease  Continue dialysis nephrology..    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    ACTH stimulation test normal no evidence of adrenal insufficiency      Discussed with patient and RN at bedside        Analgesia: Tylenol    Nutrition: Consistent carbohydrate cardiac diet    DVT Prophylaxis: Lovenox       Code Status: Full code    DISPO: to  Be determined    Family Contact: Since daughter       SUBJECTIVE     [name] states she has chest pain    MEDICATIONS     Current Facility-Administered Medications   Medication Dose Route Frequency    atorvastatin  40 mg Oral Daily    collagenase   Topical Q24H SCH    darbepoetin alfa  60 mcg Subcutaneous Weekly    doxycycline  100 mg Oral Q12H SCH    gabapentin  300 mg Oral Daily    insulin lispro  1-6 Units Subcutaneous QHS    insulin lispro  2-10 Units Subcutaneous TID AC     lactobacillus/streptococcus  1 capsule Oral Daily    lidocaine  1 patch Transdermal Q24H SCH    midodrine  10 mg Oral TID MEALS    pantoprazole  40 mg Oral QAM AC    sevelamer  800 mg Oral TID MEALS    sodium chloride  250 mL Intravenous Once    sodium hypochlorite   Irrigation Q12H       PHYSICAL EXAM     Vitals:    07/24/19 1922   BP: 131/89   Pulse: 81   Resp: 18   Temp: 99.2 F (37.3 C)   SpO2: 98%       Temperature: Temp  Min: 97.2 F (36.2 C)  Max: 99.5 F (37.5 C)  Pulse: Pulse  Min: 68  Max: 81  Respiratory: Resp  Min: 16  Max: 22  Non-Invasive BP: BP  Min: 102/74  Max: 192/60  Pulse Oximetry SpO2  Min: 92 %  Max: 99 %    Intake and Output Summary (Last 24 hours) at Date Time    Intake/Output Summary (Last 24 hours) at  07/24/2019 2012  Last data filed at 07/24/2019 1815  Gross per 24 hour   Intake 450 ml   Output 2400 ml   Net -1950 ml         GEN APPEARANCE: Drowsy but arousable A&OX3  HEENT: PERLA; EOMI; Conjunctiva Clear  NECK: Supple; No bruits  CVS: RRR, S1, S2; No M/G/R  LUNGS: CTAB; No Wheezes; No Rhonchi: No rales  ABD: Soft; No TTP; + Normoactive BS  EXT: No edema; Pulses 2+ and intact  Skin exam: Warm and dry  NEURO: CN 2-12 intact; No Focal neurological deficits  CAP REFILL: Less than 3 seconds capillary refill  MENTAL STATUS: Drowsy but arousable oriented x3 but slow to respond          LABS     Recent Labs   Lab 07/23/19  0444 07/22/19  1542 07/22/19  0848   WBC 7.88 11.07* 8.20   RBC 2.71* 2.42* 2.06*   Hgb 7.7* 6.8* 5.7*   Hematocrit 24.8* 21.8* 18.8*   MCV 91.5 90.1 91.3   Platelets 179 155 159       Recent Labs   Lab 07/23/19  0444 07/22/19  0335 07/22/19  0031 07/21/19  2003 07/21/19  1250 07/20/19  2052 07/18/19  0335   Sodium 137 137 135* 137 136  More results in Results Review 136   Potassium 4.3 5.0 5.0 5.0 4.9  More results in Results Review 5.3*   Chloride 100 98* 96* 97* 97*  More results in Results Review 98*   CO2 22 21* 20* 20* 21*  More results in Results Review 23   BUN  57.0* 96.0* 96.0* 94.0* 91.0*  More results in Results Review 73.0*   Creatinine 6.1* 9.7* 9.5* 9.4* 9.2*  More results in Results Review 7.3*   Glucose 164* 139* 154* 164* 213*  More results in Results Review 141*   Calcium 8.9 8.1* 8.7 8.5 8.4*  More results in Results Review 8.8   Magnesium 2.2 2.3  --   --   --   --  2.1   More results in Results Review = values in this interval not displayed.       Recent Labs   Lab 07/23/19  0444 07/22/19  0335 07/20/19  2052   ALT  --   --  11   AST (SGOT)  --   --  10   Bilirubin, Total  --   --  0.3   Albumin 3.3* 3.3* 2.8*   Alkaline Phosphatase  --   --  109*       Recent Labs   Lab 07/21/19  1058 07/21/19  0858 07/20/19  2052   Troponin I 0.01 <0.01 <0.01             Microbiology Results     Procedure Component Value Units Date/Time    Blood Culture Aerobic/Anaerobic #1 [[id number]] Collected: 07/20/19 2052    Specimen: Arm from Blood, Venipuncture Updated: 07/23/19 0221    Narrative:      ORDER#: [id number]                                    ORDERED BY: Randolm Idol  SOURCE: Blood, Venipuncture arm                      COLLECTED:  07/20/19 20:52  ANTIBIOTICS AT COLL.:  RECEIVED :  07/20/19 21:25  Culture Blood Aerobic and Anaerobic        PRELIM      07/23/19 02:21  07/22/19   No Growth after 1 day/s of incubation.  07/23/19   No Growth after 2 day/s of incubation.      Blood Culture Aerobic/Anaerobic #2 [[id number]] Collected: 07/20/19 2052    Specimen: Arm from Blood, Venipuncture Updated: 07/23/19 0221    Narrative:      ORDER#: [id number]                                    ORDERED BY: Randolm Idol  SOURCE: Blood, Venipuncture arm                      COLLECTED:  07/20/19 20:52  ANTIBIOTICS AT COLL.:                                RECEIVED :  07/20/19 21:25  Culture Blood Aerobic and Anaerobic        PRELIM      07/23/19 02:21  07/22/19   No Growth after 1 day/s of incubation.  07/23/19   No Growth after 2 day/s of incubation.       COVID-19 (SARS-COV-2) [facility]) [[id number]] Collected: 07/21/19 0152    Specimen: Nasopharyngeal Swab from Nasopharynx Updated: 07/21/19 0324     Purpose of COVID testing Screening     SARS-CoV-2 Specimen Source Nasopharyngeal     SARS CoV 2 Overall Result Negative     Comment: Test performed using the Abbott ID NOW EUA assay.  Please see Fact Sheets for patients and providers located at:  http://[facility].info/    This test is for the qualitative detection of SARS-CoV-2  (COVID19) nucleic acid. Viral nucleic acids may persist in vivo,  independent of viability. Detection of viral nucleic acid does  not imply the presence of infectious virus, or that virus  nucleic acid is the cause of clinical symptoms. Negative  results should be treated as presumptive and, if inconsistent  with clinical signs and symptoms or necessary for patient  management, should be tested with an alternative molecular  assay. Negative results do not preclude SARS-CoV-2 infection  and should not be used as the sole basis for patient  management decisions. Invalid results may be due to inhibiting  substances in the specimen and recollection should occur.         Narrative:      o Collect and clearly label specimen type:  o Upper respiratory specimen: One Nasopharyngeal Dry Swab NO  Transport Media.  o Hand deliver to laboratory ASAP  Indication for testing->Extended care facility admission to  semi private room  Screening    Culture + Gram Stain,Aerobic, Wound [[id number]] Collected: 07/22/19 1033    Specimen: Wound Updated: 07/23/19 1527    Narrative:      culturette  ORDER#: [id number]                                    ORDERED BY: Lahoma Crocker, ERIC  SOURCE: Wound abdominal wall                         COLLECTED:  07/22/19 10:33  ANTIBIOTICS AT  COLL.:                                RECEIVED :  07/22/19 13:37  ORDER ENTRY COMMENTS:  culturette  Stain, Gram                                FINAL       07/22/19 15:20  07/22/19   Few  WBCs             No Squamous epithelial cells seen             No organisms seen  Culture and Gram Stain, Aerobic, Wound     PRELIM      07/23/19 15:27   +  07/23/19   Light growth of Staphylococcus aureus               Further workup to follow including susceptibility testing        MRSA culture - Nares [[id number]] Collected: 07/21/19 1058    Specimen: Culturette from Nares Updated: 07/22/19 1117     Culture MRSA Surveillance Negative for Methicillin Resistant Staph aureus    MRSA culture - Throat [[id number]] Collected: 07/21/19 1058    Specimen: Culturette from Throat Updated: 07/22/19 1117     Culture MRSA Surveillance Negative for Methicillin Resistant Staph aureus           RADIOLOGY     Upon my review:    Shaune Pascal  8:12 PM 07/24/2019

## 2019-07-24 NOTE — Progress Notes (Signed)
INFECTIOUS DISEASES PROGRESS NOTE    Date Time: 07/24/19 4:07 PM  Patient Name: [name],[name]  Patient status.Inpatient  Hospital Day: 3    Assessment and Plan:   1. Hypotension  Resolved with  volume  2. Abd wound with MRSA,  PLAN: 1. Dakins  2. Change antibiotic to po Doxy    Subjective:   Non toxic    Review of Systems:   Review of Systems -  Chest pain, SOB      Antibiotics:   Day 4    Other medications reviewed in EPIC.  Central Access:       Physical Exam:     Vitals:    07/24/19 1530   BP: 143/80   Pulse: 68   Resp:    Temp:    SpO2: 96%     Wound has clean base with some loose green slough.  No surrounding erythema.  No drainage  Labs:     Microbiology Results     Procedure Component Value Units Date/Time    Blood Culture Aerobic/Anaerobic #1 [[id number]] Collected: 07/20/19 2052    Specimen: Arm from Blood, Venipuncture Updated: 07/22/19 0221    Narrative:      ORDER#: [id number]                                    ORDERED BY: Randolm Idol  SOURCE: Blood, Venipuncture arm                      COLLECTED:  07/20/19 20:52  ANTIBIOTICS AT COLL.:                                RECEIVED :  07/20/19 21:25  Culture Blood Aerobic and Anaerobic        PRELIM      07/22/19 02:21  07/22/19   No Growth after 1 day/s of incubation.      Blood Culture Aerobic/Anaerobic #2 [[id number]] Collected: 07/20/19 2052    Specimen: Arm from Blood, Venipuncture Updated: 07/22/19 0221    Narrative:      ORDER#: [id number]                                    ORDERED BY: Randolm Idol  SOURCE: Blood, Venipuncture arm                      COLLECTED:  07/20/19 20:52  ANTIBIOTICS AT COLL.:                                RECEIVED :  07/20/19 21:25  Culture Blood Aerobic and Anaerobic        PRELIM      07/22/19 02:21  07/22/19   No Growth after 1 day/s of incubation.      COVID-19 (SARS-COV-2) [facility]) [[id number]] Collected: 07/21/19 0152    Specimen: Nasopharyngeal Swab from Nasopharynx Updated: 07/21/19 0324     Purpose of COVID  testing Screening     SARS-CoV-2 Specimen Source Nasopharyngeal     SARS CoV 2 Overall Result Negative     Comment: Test performed using the Abbott ID NOW EUA assay.  Please see Fact Sheets for patients and  providers located at:  http://[facility].info/    This test is for the qualitative detection of SARS-CoV-2  (COVID19) nucleic acid. Viral nucleic acids may persist in vivo,  independent of viability. Detection of viral nucleic acid does  not imply the presence of infectious virus, or that virus  nucleic acid is the cause of clinical symptoms. Negative  results should be treated as presumptive and, if inconsistent  with clinical signs and symptoms or necessary for patient  management, should be tested with an alternative molecular  assay. Negative results do not preclude SARS-CoV-2 infection  and should not be used as the sole basis for patient  management decisions. Invalid results may be due to inhibiting  substances in the specimen and recollection should occur.         Narrative:      o Collect and clearly label specimen type:  o Upper respiratory specimen: One Nasopharyngeal Dry Swab NO  Transport Media.  o Hand deliver to laboratory ASAP  Indication for testing->Extended care facility admission to  semi private room  Screening    MRSA culture - Nares [[id number]] Collected: 07/21/19 1058    Specimen: Culturette from Nares Updated: 07/21/19 1511    MRSA culture - Throat [[id number]] Collected: 07/21/19 1058    Specimen: Culturette from Throat Updated: 07/21/19 1511          CBC w/Diff CMP   Recent Labs   Lab 07/23/19  0444 07/22/19  1542 07/22/19  0848 07/22/19  0411 07/22/19  0411 07/21/19  0858 07/21/19  0858   WBC 7.88 11.07* 8.20  More results in Results Review 7.91  More results in Results Review 10.79*   Hgb 7.7* 6.8* 5.7*  More results in Results Review 5.7*  More results in Results Review 7.6*   Hematocrit 24.8* 21.8* 18.8*  More results in Results Review 19.3*  More results in Results  Review 25.5*   Platelets 179 155 159  More results in Results Review 161  More results in Results Review 175   MCV 91.5 90.1 91.3  More results in Results Review 91.5  More results in Results Review 91.4   Neutrophils 68.9  --   --   --  73.2  --  73.1   More results in Results Review = values in this interval not displayed.       PT/INR         Recent Labs   Lab 07/23/19  0444 07/22/19  0335 07/22/19  0031 07/21/19  0858 07/20/19  2052 07/18/19  0335 07/18/19  0335   Sodium 137 137 135*  More results in Results Review 133*  More results in Results Review 136   Potassium 4.3 5.0 5.0  More results in Results Review 6.2*  More results in Results Review 5.3*   Chloride 100 98* 96*  More results in Results Review 95*  More results in Results Review 98*   CO2 22 21* 20*  More results in Results Review 23  More results in Results Review 23   BUN 57.0* 96.0* 96.0*  More results in Results Review 91.0*  More results in Results Review 73.0*   Creatinine 6.1* 9.7* 9.5*  More results in Results Review 9.1*  More results in Results Review 7.3*   Glucose 164* 139* 154*  More results in Results Review 212*  More results in Results Review 141*   Calcium 8.9 8.1* 8.7  More results in Results Review 8.9  More results in Results Review 8.8  Magnesium 2.2 2.3  --   --   --   --  2.1   Phosphorus 6.1* 8.7*  --   --   --   --  5.5*   Protein, Total  --   --   --   --  7.5  --   --    Albumin 3.3* 3.3*  --   --  2.8*  More results in Results Review 2.5*   AST (SGOT)  --   --   --   --  10  --   --    ALT  --   --   --   --  11  --   --    Alkaline Phosphatase  --   --   --   --  109*  --   --    Bilirubin, Total  --   --   --   --  0.3  --   --    More results in Results Review = values in this interval not displayed.      Glucose POCT   Recent Labs   Lab 07/23/19  0444 07/22/19  0335 07/22/19  0031 07/21/19  2003 07/21/19  1250 07/21/19  0858 07/20/19  2052   Glucose 164* 139* 154* 164* 213* 210* 212*          Rads:     Radiology  Results (24 Hour)     ** No results found for the last 24 hours. **            Signed by: Ernst Breach

## 2019-07-24 NOTE — Plan of Care (Signed)
No new cardiac issues. Patient is scheduled to have HD this afternoon. Awaiting repeat echo, which has been ordered, to re-assess pericardial effusion.

## 2019-07-25 ENCOUNTER — Inpatient Hospital Stay: Payer: No Typology Code available for payment source

## 2019-07-25 ENCOUNTER — Inpatient Hospital Stay (HOSPITAL_COMMUNITY): Payer: No Typology Code available for payment source

## 2019-07-25 DIAGNOSIS — I319 Disease of pericardium, unspecified: Secondary | ICD-10-CM

## 2019-07-25 LAB — CBC AND DIFFERENTIAL
Absolute NRBC: 0 10*3/uL (ref 0.00–0.00)
Basophils Absolute Automated: 0.04 10*3/uL (ref 0.00–0.08)
Basophils Automated: 0.4 %
Eosinophils Absolute Automated: 0.22 10*3/uL (ref 0.00–0.44)
Eosinophils Automated: 2.4 %
Hematocrit: 27.3 % — ABNORMAL LOW (ref 34.7–43.7)
Hgb: 8 g/dL — ABNORMAL LOW (ref 11.4–14.8)
Immature Granulocytes Absolute: 0.05 10*3/uL (ref 0.00–0.07)
Immature Granulocytes: 0.5 %
Lymphocytes Absolute Automated: 1.36 10*3/uL (ref 0.42–3.22)
Lymphocytes Automated: 14.9 %
MCH: 27.4 pg (ref 25.1–33.5)
MCHC: 29.3 g/dL — ABNORMAL LOW (ref 31.5–35.8)
MCV: 93.5 fL (ref 78.0–96.0)
MPV: 10.7 fL (ref 8.9–12.5)
Monocytes Absolute Automated: 1.03 10*3/uL — ABNORMAL HIGH (ref 0.21–0.85)
Monocytes: 11.3 %
Neutrophils Absolute: 6.4 10*3/uL — ABNORMAL HIGH (ref 1.10–6.33)
Neutrophils: 70.5 %
Nucleated RBC: 0 /100 WBC (ref 0.0–0.0)
Platelets: 211 10*3/uL (ref 142–346)
RBC: 2.92 10*6/uL — ABNORMAL LOW (ref 3.90–5.10)
RDW: 14 % (ref 11–15)
WBC: 9.1 10*3/uL (ref 3.10–9.50)

## 2019-07-25 LAB — GLUCOSE WHOLE BLOOD - POCT
Whole Blood Glucose POCT: 155 mg/dL — ABNORMAL HIGH (ref 70–100)
Whole Blood Glucose POCT: 241 mg/dL — ABNORMAL HIGH (ref 70–100)
Whole Blood Glucose POCT: 169 mg/dL — ABNORMAL HIGH (ref 70–100)
Whole Blood Glucose POCT: 229 mg/dL — ABNORMAL HIGH (ref 70–100)

## 2019-07-25 LAB — ECHOCARDIOGRAM ADULT LIMITED W CLR & DOPP WAVFRM LTD: Visually Estimated Ejection Fraction: 64

## 2019-07-25 NOTE — Plan of Care (Addendum)
Problem: Safety  Goal: Patient will be free from injury during hospitalization  Outcome: Progressing  Flowsheets (Taken 07/25/2019 0935)  Patient will be free from injury during hospitalization:   Assess patient's risk for falls and implement fall prevention plan of care per policy   Ensure appropriate safety devices are available at the bedside   Provide and maintain safe environment   Include patient/ family/ care giver in decisions related to safety   Use appropriate transfer methods   Hourly rounding  Goal: Patient will be free from infection during hospitalization  Outcome: Progressing  Flowsheets (Taken 07/25/2019 0935)  Free from Infection during hospitalization:   Assess and monitor for signs and symptoms of infection   Monitor lab/diagnostic results   Encourage patient and family to use good hand hygiene technique   Monitor all insertion sites (i.e. indwelling lines, tubes, urinary catheters, and drains)     Problem: Pain  Goal: Pain at adequate level as identified by patient  Outcome: Progressing  Flowsheets (Taken 07/25/2019 0935)  Pain at adequate level as identified by patient:   Identify patient comfort function goal   Assess for risk of opioid induced respiratory depression, including snoring/sleep apnea. Alert healthcare team of risk factors identified.   Assess pain on admission, during daily assessment and/or before any "as needed" intervention(s)   Reassess pain within 30-60 minutes of any procedure/intervention, per Pain Assessment, Intervention, Reassessment (AIR) Cycle   Evaluate if patient comfort function goal is met   Offer non-pharmacological pain management interventions   Evaluate patient's satisfaction with pain management progress   Consult/collaborate with Pain Service     Problem: Compromised Tissue integrity  Goal: Damaged tissue is healing and protected  Outcome: Progressing  Flowsheets (Taken 07/25/2019 0935)  Damaged tissue is healing and protected:   Monitor/assess  Braden scale every shift   Reposition patient every 2 hours and as needed unless able to reposition self   Relieve pressure to bony prominences for patients at moderate and high risk   Provide wound care per wound care algorithm   Increase activity as tolerated/progressive mobility   Avoid shearing injuries   Keep intact skin clean and dry   Use incontinence wipes for cleaning urine, stool and caustic drainage. Foley care as needed   Use bath wipes, not soap and water, for daily bathing   Monitor external devices/tubes for correct placement to prevent pressure, friction and shearing   Encourage use of lotion/moisturizer on skin  Goal: Nutritional status is improving  Outcome: Progressing  Flowsheets (Taken 07/25/2019 0935)  Nutritional status is improving:   Assist patient with eating   Encourage patient to take dietary supplement(s) as ordered   Collaborate with Clinical Nutritionist   Include patient/patient care companion in decisions related to nutrition     Problem: Renal Instability  Goal: Fluid and electrolyte balance are achieved/maintained  Outcome: Progressing  Flowsheets (Taken 07/25/2019 0935)  Fluid and electrolyte balance are achieved/maintained:   Monitor intake and output every shift   Assess for confusion/personality changes   Monitor/assess lab values and report abnormal values   Assess and reassess fluid and electrolyte status   Provide adequate hydration   Monitor daily weight   Monitor for muscle weakness   Observe for cardiac arrhythmias   Observe for seizure activity and initiate seizure precautions if indicated     Problem: Patient Receiving Advanced Renal Therapies  Goal: Therapy access site remains intact  Outcome: Progressing  Flowsheets (Taken 07/25/2019 0935)  Therapy access site remains  intact:   Assess therapy access site   Change therapy access site dressing as needed     Pt is A&Ox4. L arm in brace. Lidocaine patch applied to L thigh. Wound care provided to  abdominal wound. VSS. No acute distress noted. Monitored BG; SSI given as indicated. L arm pain managed with PRN Oxycodone. No BM during shift. Good appetite.

## 2019-07-25 NOTE — Progress Notes (Signed)
Echo with small to moderate persistent pericardial effusion without evidence of tamponade.  Left ventricular function is normal.  We will see as needed please call [place] if any further cardiovascular issues arise

## 2019-07-25 NOTE — Plan of Care (Signed)
Pt is [name], pleasant and cooperative. Denies any n/v or pain, able to make needs known. Vitals have been stable. Meds were given as scheduled, wound care was completed to Abd per order. Permacath site is clean, dry and intact. External cath in place. No BM this shift. On 1L NC, satting 95-96%. Plan for repeat echo tomorrow and HD (MWF). Pt resting comfortably, no other distress noted. Safety precautions are in place and call bell is within reach.    Problem: Safety  Goal: Patient will be free from injury during hospitalization  Outcome: Progressing  Flowsheets (Taken 07/25/2019 0154)  Patient will be free from injury during hospitalization:   Assess patient's risk for falls and implement fall prevention plan of care per policy   Provide and maintain safe environment   Use appropriate transfer methods   Ensure appropriate safety devices are available at the bedside   Include patient/ family/ care giver in decisions related to safety  Goal: Patient will be free from infection during hospitalization  Outcome: Progressing  Flowsheets (Taken 07/25/2019 0154)  Free from Infection during hospitalization:   Assess and monitor for signs and symptoms of infection   Monitor lab/diagnostic results   Monitor all insertion sites (i.e. indwelling lines, tubes, urinary catheters, and drains)   Encourage patient and family to use good hand hygiene technique     Problem: Pain  Goal: Pain at adequate level as identified by patient  Outcome: Progressing  Flowsheets (Taken 07/25/2019 0154)  Pain at adequate level as identified by patient:   Identify patient comfort function goal   Assess for risk of opioid induced respiratory depression, including snoring/sleep apnea. Alert healthcare team of risk factors identified.   Assess pain on admission, during daily assessment and/or before any "as needed" intervention(s)   Reassess pain within 30-60 minutes of any procedure/intervention, per Pain Assessment, Intervention, Reassessment  (AIR) Cycle   Evaluate if patient comfort function goal is met   Evaluate patient's satisfaction with pain management progress   Offer non-pharmacological pain management interventions     Problem: Psychosocial and Spiritual Needs  Goal: Demonstrates ability to cope with hospitalization/illness  Outcome: Progressing  Flowsheets (Taken 07/25/2019 0154)  Demonstrates ability to cope with hospitalizations/illness:   Encourage verbalization of feelings/concerns/expectations   Provide quiet environment   Assist patient to identify own strengths and abilities   Encourage patient to set small goals for self   Encourage participation in diversional activity   Reinforce positive adaptation of new coping behaviors   Include patient/ patient care companion in decisions     Problem: Patient Receiving Advanced Renal Therapies  Goal: Therapy access site remains intact  Outcome: Progressing

## 2019-07-25 NOTE — Progress Notes (Signed)
[facility] PROGRESS NOTE  Aaron Edelman, x [phone] ([place])      Date Time: 07/25/19 9:47 AM  Patient Name: [name]  Attending Physician: Lisa Roca, MD    CC: follow-up ESRD, provision of hemodialysis    Assessment:     1. ESRD on HD MWF at [facility], Dr. Aline August  2. Hyperkalemia - better  3. Hypotension - on midodrine  4. Anemia in CKD  5. Mild metabolic acidosis    Recommendations:     S/p HD yesterday with 2.4L net ultrafiltration  Next HD with UF tomorrow, continue TTS while in house  Recommend right hand/foreamr x-ray - continues to report pain and decreased range of motion    Case discussed with: patient, HD unit    Verl Blalock, MD  [facility]  [phone] (office)  X [phone] (FFX Spectra-Link)      Subjective: awake, alert; right hand/forearm pain     Review of Systems:   Cardio: chest pain, no edema  Pulm: no SOB  GI: no abdominal pain  MSK: right hand pain, decreased range of motion    Physical Exam:     Vitals:    07/24/19 1922 07/24/19 2304 07/25/19 0303 07/25/19 0729   BP: 131/89 119/63 103/55 114/74   Pulse: 81 84 79 64   Resp: 18 18 18 16    Temp: 99.2 F (37.3 C) 98.6 F (37 C) 98 F (36.7 C) 97.9 F (36.6 C)   TempSrc: Oral Oral Oral Oral   SpO2: 98% 95% 96% 99%   Weight:       Height:           Intake and Output Summary (Last 24 hours) at Date Time    Intake/Output Summary (Last 24 hours) at 07/25/2019 0947  Last data filed at 07/25/2019 0943  Gross per 24 hour   Intake 587 ml   Output 2400 ml   Net -1813 ml       General: awake, alert, oriented x 3, no acute distress.  Cardiovascular: regular rate and rhythm  Lungs: clear to auscultation bilaterally, bilateral air entry, normal work of breathing  Abdomen: soft, NT, ND  Extremities: no edema    Access:  R IJ HDTC    Meds:      Scheduled Meds: PRN Meds:    atorvastatin, 40 mg, Oral, Daily  collagenase, , Topical, Q24H SCH  darbepoetin alfa, 60 mcg, Subcutaneous, Weekly  doxycycline, 100 mg,  Oral, Q12H SCH  insulin lispro, 1-6 Units, Subcutaneous, QHS  insulin lispro, 2-10 Units, Subcutaneous, TID AC  lactobacillus/streptococcus, 1 capsule, Oral, Daily  lidocaine, 1 patch, Transdermal, Q24H SCH  midodrine, 10 mg, Oral, TID MEALS  pantoprazole, 40 mg, Oral, QAM AC  sevelamer, 800 mg, Oral, TID MEALS  sodium chloride, 250 mL, Intravenous, Once  sodium hypochlorite, , Irrigation, Q12H          Continuous Infusions:   sodium chloride, , PRN  sodium chloride, , PRN  acetaminophen, 650 mg, Q6H PRN   Or  acetaminophen, 650 mg, Q6H PRN  dextrose, 15 g of glucose, PRN   And  dextrose, 12.5 g, PRN   And  glucagon (rDNA), 1 mg, PRN  melatonin, 3 mg, QHS PRN  naloxone, 0.2 mg, PRN  ondansetron, 4 mg, Q6H PRN   Or  ondansetron, 4 mg, Q6H PRN  oxyCODONE, 10 mg, Q3H PRN              Labs:  Recent Labs   Lab 07/25/19  0358 07/23/19  0444 07/22/19  1542   WBC 9.10 7.88 11.07*   Hgb 8.0* 7.7* 6.8*   Hematocrit 27.3* 24.8* 21.8*   Platelets 211 179 155     Recent Labs   Lab 07/23/19  0444 07/22/19  0335 07/22/19  0031 07/21/19  0858 07/20/19  2052   Sodium 137 137 135*  More results in Results Review 133*   Potassium 4.3 5.0 5.0  More results in Results Review 6.2*   Chloride 100 98* 96*  More results in Results Review 95*   CO2 22 21* 20*  More results in Results Review 23   BUN 57.0* 96.0* 96.0*  More results in Results Review 91.0*   Creatinine 6.1* 9.7* 9.5*  More results in Results Review 9.1*   Calcium 8.9 8.1* 8.7  More results in Results Review 8.9   Albumin 3.3* 3.3*  --   --  2.8*   Phosphorus 6.1* 8.7*  --   --   --    Magnesium 2.2 2.3  --   --   --    Glucose 164* 139* 154*  More results in Results Review 212*   EGFR 8.4 4.9 5.0  More results in Results Review 5.3   More results in Results Review = values in this interval not displayed.     : No results found.        Signed by: Verl Blalock, MD, FASN

## 2019-07-25 NOTE — Progress Notes (Signed)
[facility]  PROGRESS NOTE      Patient: [name]  Date: 07/25/2019   LOS: 4 Days  Admission Date: 07/20/2019   MRN: [id number]  Attending: Lisa Roca  Please contact me on the following Spectralink [phone]       ASSESSMENT/PLAN     [name] is a 64 y.o. female admitted with Shortness of breath    Interval Summary:     Patient Active Hospital Problem List:  CHF/fluid overload.  Patient is status post dialysis and ultrafiltration today.  Repeat dialysis and ultrafiltration in a.m.    End-stage renal disease  Continue dialysis nephrology..    Anemia  Due to CKD/ESRD  Status post blood transfusion and Aranesp    Somnolence   This has resolved with discontinuing gabapentin   Resume at a lower dose in the next 24 to 48 hours     Right forearm pain   We will repeat x-rays of wrist and right forearm l      Pericardial effusion  Patient seen by Powell   Repeat echocardiogram shows a small to moderate effusion no evidence of tamponade no further cardiac work-up at present time    Abdominal wound with MRSA, Enterococcus and Pseudomonas  Follow-up on wound cultures if negative discontinue antibiotics per ID  Patient seen in follow-up by wound care nurse  Appreciate assistance from wound and ostomy nurse    Hypotension/shock  Probably due to hypovolemia and recent MDR infection           Sleep apnea by history    Hyperlipidemia  Continue statins    Pneumonia  Clinically improved.    ACTH stimulation test normal no evidence of adrenal insufficiency      Discussed with patient and RN at bedside        Analgesia: Tylenol    Nutrition: Consistent carbohydrate cardiac diet    DVT Prophylaxis: Lovenox       Code Status: Full code    DISPO: to  Be determined    Family Contact: Since daughter       SUBJECTIVE     [name] states she has chest pain    MEDICATIONS     Current Facility-Administered Medications   Medication Dose Route Frequency    atorvastatin  40 mg Oral Daily    collagenase    Topical Q24H SCH    darbepoetin alfa  60 mcg Subcutaneous Weekly    doxycycline  100 mg Oral Q12H Garretson    insulin lispro  1-6 Units Subcutaneous QHS    insulin lispro  2-10 Units Subcutaneous TID AC    lactobacillus/streptococcus  1 capsule Oral Daily    lidocaine  1 patch Transdermal Q24H SCH    midodrine  10 mg Oral TID MEALS    pantoprazole  40 mg Oral QAM AC    sevelamer  800 mg Oral TID MEALS    sodium chloride  250 mL Intravenous Once    sodium hypochlorite   Irrigation Q12H       PHYSICAL EXAM     Vitals:    07/25/19 1925   BP: 109/65   Pulse: 70   Resp: 18   Temp: 98.2 F (36.8 C)   SpO2: 97%       Temperature: Temp  Min: 97.9 F (36.6 C)  Max: 98.6 F (37 C)  Pulse: Pulse  Min: 64  Max: 84  Respiratory: Resp  Min: 16  Max: 18  Non-Invasive BP: BP  Min: 103/55  Max: 136/64  Pulse Oximetry SpO2  Min: 95 %  Max: 99 %    Intake and Output Summary (Last 24 hours) at Date Time    Intake/Output Summary (Last 24 hours) at 07/25/2019 1936  Last data filed at 07/25/2019 1800  Gross per 24 hour   Intake 827 ml   Output --   Net 827 ml         GEN APPEARANCE: Drowsy but arousable A&OX3  HEENT: PERLA; EOMI; Conjunctiva Clear  NECK: Supple; No bruits  CVS: RRR, S1, S2; No M/G/R  LUNGS: CTAB; No Wheezes; No Rhonchi: No rales  ABD: Soft; No TTP; + Normoactive BS  EXT: No edema; Pulses 2+ and intact  Skin exam: Warm and dry  NEURO: CN 2-12 intact; No Focal neurological deficits  CAP REFILL: Less than 3 seconds capillary refill  MENTAL STATUS: Drowsy but arousable oriented x3 but slow to respond          LABS     Recent Labs   Lab 07/25/19  0358 07/23/19  0444 07/22/19  1542   WBC 9.10 7.88 11.07*   RBC 2.92* 2.71* 2.42*   Hgb 8.0* 7.7* 6.8*   Hematocrit 27.3* 24.8* 21.8*   MCV 93.5 91.5 90.1   Platelets 211 179 155       Recent Labs   Lab 07/23/19  0444 07/22/19  0335 07/22/19  0031 07/21/19  2003 07/21/19  1250   Sodium 137 137 135* 137 136   Potassium 4.3 5.0 5.0 5.0 4.9   Chloride 100 98* 96* 97* 97*   CO2 22  21* 20* 20* 21*   BUN 57.0* 96.0* 96.0* 94.0* 91.0*   Creatinine 6.1* 9.7* 9.5* 9.4* 9.2*   Glucose 164* 139* 154* 164* 213*   Calcium 8.9 8.1* 8.7 8.5 8.4*   Magnesium 2.2 2.3  --   --   --        Recent Labs   Lab 07/23/19  0444 07/22/19  0335 07/20/19  2052   ALT  --   --  11   AST (SGOT)  --   --  10   Bilirubin, Total  --   --  0.3   Albumin 3.3* 3.3* 2.8*   Alkaline Phosphatase  --   --  109*       Recent Labs   Lab 07/21/19  1058 07/21/19  0858 07/20/19  2052   Troponin I 0.01 <0.01 <0.01             Microbiology Results     Procedure Component Value Units Date/Time    Blood Culture Aerobic/Anaerobic #1 [[id number]] Collected: 07/20/19 2052    Specimen: Arm from Blood, Venipuncture Updated: 07/23/19 0221    Narrative:      ORDER#: [id number]                                    ORDERED BY: Marcello Moores, SASHA  SOURCE: Blood, Venipuncture arm                      COLLECTED:  07/20/19 20:52  ANTIBIOTICS AT COLL.:                                RECEIVED :  07/20/19 21:25  Culture Blood Aerobic and Anaerobic  PRELIM      07/23/19 02:21  07/22/19   No Growth after 1 day/s of incubation.  07/23/19   No Growth after 2 day/s of incubation.      Blood Culture Aerobic/Anaerobic #2 [[id number]] Collected: 07/20/19 2052    Specimen: Arm from Blood, Venipuncture Updated: 07/23/19 0221    Narrative:      ORDER#: [id number]                                    ORDERED BY: Randolm Idol  SOURCE: Blood, Venipuncture arm                      COLLECTED:  07/20/19 20:52  ANTIBIOTICS AT COLL.:                                RECEIVED :  07/20/19 21:25  Culture Blood Aerobic and Anaerobic        PRELIM      07/23/19 02:21  07/22/19   No Growth after 1 day/s of incubation.  07/23/19   No Growth after 2 day/s of incubation.      COVID-19 (SARS-COV-2) [employer] [facility]) [[id number]] Collected: 07/21/19 0152    Specimen: Nasopharyngeal Swab from Nasopharynx Updated: 07/21/19 0324     Purpose of COVID testing Screening     SARS-CoV-2 Specimen Source  Nasopharyngeal     SARS CoV 2 Overall Result Negative     Comment: Test performed using the Abbott ID NOW EUA assay.  Please see Fact Sheets for patients and providers located at:  http://olson-hall.info/    This test is for the qualitative detection of SARS-CoV-2  (COVID19) nucleic acid. Viral nucleic acids may persist in vivo,  independent of viability. Detection of viral nucleic acid does  not imply the presence of infectious virus, or that virus  nucleic acid is the cause of clinical symptoms. Negative  results should be treated as presumptive and, if inconsistent  with clinical signs and symptoms or necessary for patient  management, should be tested with an alternative molecular  assay. Negative results do not preclude SARS-CoV-2 infection  and should not be used as the sole basis for patient  management decisions. Invalid results may be due to inhibiting  substances in the specimen and recollection should occur.         Narrative:      o Collect and clearly label specimen type:  o Upper respiratory specimen: One Nasopharyngeal Dry Swab NO  Transport Media.  o Hand deliver to laboratory ASAP  Indication for testing->Extended care facility admission to  semi private room  Screening    Culture + Gram Stain,Aerobic, Wound [[id number]] Collected: 07/22/19 1033    Specimen: Wound Updated: 07/23/19 1527    Narrative:      culturette  ORDER#: [id number]                                    ORDERED BY: REINES, ERIC  SOURCE: Wound abdominal wall                         COLLECTED:  07/22/19 10:33  ANTIBIOTICS AT COLL.:  RECEIVED :  07/22/19 13:37  ORDER ENTRY COMMENTS:  culturette  Stain, Gram                                FINAL       07/22/19 15:20  07/22/19   Few WBCs             No Squamous epithelial cells seen             No organisms seen  Culture and Gram Stain, Aerobic, Wound     PRELIM      07/23/19 15:27   +  07/23/19   Light growth of Staphylococcus aureus                Further workup to follow including susceptibility testing        MRSA culture - Nares [[id number]] Collected: 07/21/19 1058    Specimen: Culturette from Nares Updated: 07/22/19 1117     Culture MRSA Surveillance Negative for Methicillin Resistant Staph aureus    MRSA culture - Throat [[id number]] Collected: 07/21/19 1058    Specimen: Culturette from Throat Updated: 07/22/19 1117     Culture MRSA Surveillance Negative for Methicillin Resistant Staph aureus           RADIOLOGY     Upon my review:    Shaune Pascal  7:36 PM 07/25/2019

## 2019-07-26 LAB — TROPONIN I
Troponin I: <0.01 ng/mL (ref 0.00–0.05)
Troponin I: <0.01 ng/mL (ref 0.00–0.05)

## 2019-07-26 LAB — CBC AND DIFFERENTIAL
Absolute NRBC: 0 10*3/uL (ref 0.00–0.00)
Basophils Absolute Automated: 0.03 10*3/uL (ref 0.00–0.08)
Basophils Automated: 0.3 %
Eosinophils Absolute Automated: 0.28 10*3/uL (ref 0.00–0.44)
Eosinophils Automated: 2.9 %
Hematocrit: 27.2 % — ABNORMAL LOW (ref 34.7–43.7)
Hgb: 8.1 g/dL — ABNORMAL LOW (ref 11.4–14.8)
Immature Granulocytes Absolute: 0.06 10*3/uL (ref 0.00–0.07)
Immature Granulocytes: 0.6 %
Lymphocytes Absolute Automated: 1.41 10*3/uL (ref 0.42–3.22)
Lymphocytes Automated: 14.8 %
MCH: 27.6 pg (ref 25.1–33.5)
MCHC: 29.8 g/dL — ABNORMAL LOW (ref 31.5–35.8)
MCV: 92.8 fL (ref 78.0–96.0)
MPV: 10.8 fL (ref 8.9–12.5)
Monocytes Absolute Automated: 1.34 10*3/uL — ABNORMAL HIGH (ref 0.21–0.85)
Monocytes: 14.1 %
Neutrophils Absolute: 6.41 10*3/uL — ABNORMAL HIGH (ref 1.10–6.33)
Neutrophils: 67.3 %
Nucleated RBC: 0 /100 WBC (ref 0.0–0.0)
Platelets: 240 10*3/uL (ref 142–346)
RBC: 2.93 10*6/uL — ABNORMAL LOW (ref 3.90–5.10)
RDW: 14 % (ref 11–15)
WBC: 9.53 10*3/uL — ABNORMAL HIGH (ref 3.10–9.50)

## 2019-07-26 LAB — BASIC METABOLIC PANEL
Anion Gap: 15 (ref 5.0–15.0)
BUN: 68 mg/dL — ABNORMAL HIGH (ref 7.0–19.0)
CO2: 23 mEq/L (ref 22–29)
Calcium: 8.6 mg/dL (ref 8.5–10.5)
Chloride: 100 mEq/L (ref 100–111)
Creatinine: 7.8 mg/dL — ABNORMAL HIGH (ref 0.6–1.0)
Glucose: 235 mg/dL — ABNORMAL HIGH (ref 70–100)
Potassium: 4.7 mEq/L (ref 3.5–5.1)
Sodium: 138 mEq/L (ref 136–145)

## 2019-07-26 LAB — GFR: EGFR: 6.3

## 2019-07-26 LAB — GLUCOSE WHOLE BLOOD - POCT
Whole Blood Glucose POCT: 203 mg/dL — ABNORMAL HIGH (ref 70–100)
Whole Blood Glucose POCT: 132 mg/dL — ABNORMAL HIGH (ref 70–100)
Whole Blood Glucose POCT: 161 mg/dL — ABNORMAL HIGH (ref 70–100)
Whole Blood Glucose POCT: 158 mg/dL — ABNORMAL HIGH (ref 70–100)

## 2019-07-26 LAB — HEMOLYSIS INDEX: Hemolysis Index: 5 (ref 0–18)

## 2019-07-26 MED ORDER — SODIUM CHLORIDE 0.9 % IV BOLUS
250.0000 mL | INTRAVENOUS | Status: AC | PRN
Start: 2019-07-26 — End: 2019-07-26

## 2019-07-26 MED ORDER — ALBUMIN HUMAN 25 % IV SOLN
100.0000 mL | INTRAVENOUS | Status: AC | PRN
Start: 2019-07-26 — End: 2019-07-26
  Administered 2019-07-26 (×2): 100 mL via INTRAVENOUS
  Filled 2019-07-26 (×2): qty 100

## 2019-07-26 MED ORDER — SODIUM CHLORIDE 0.9 % IV BOLUS
100.0000 mL | INTRAVENOUS | Status: AC | PRN
Start: 2019-07-26 — End: 2019-07-26

## 2019-07-26 MED ORDER — COLCHICINE 0.6 MG PO TABS
0.60 mg | ORAL_TABLET | Freq: Once | ORAL | Status: AC
Start: 2019-07-26 — End: 2019-07-26
  Administered 2019-07-26: 19:00:00 0.6 mg via ORAL
  Filled 2019-07-26: qty 1

## 2019-07-26 NOTE — Progress Notes (Signed)
Pt is contact isolation,  PPE done per protocol.  Pt is alert and oriented. Pt denied pain and sob. R Permacath access is optimally working. BP soft Pre-dialysis.  Timeouts and safety checks done. Will closely monitor the pt.       07/26/19 0915   Bedside Nurse Communication   Name of bedside RN - pre dialysis Diane Sears, RN   Treatment Initiation- With Dialysis Precautions   Time Out/Safety Check Completed Yes   Consent for HD signed for this hospitalization (Date) 05/23/19   Consent for HD signed for this hospitalization (Time) 1044   Blood Consent Verified N/A   Dialysis Precautions All Connections Secured;Saline Line Double Clamped;Venous Parameters Set;Arterial Parameters Set;Air Foam Detecctor Engaged   Dialysis Treatment Type Routine;Bedside   Special Considerations Contact isolation   Is patient diabetic? Yes   RO/Hemodialysis Architectural technologist   Is Total Chlorine less than 0.1 ppm? Yes   Orignial Total Chlorine Testing Time 0845   At 4 Hour Total Chlorine Testing Time 1345   RO/Hemodialysis Facilities manager Number 3    Machine Serial Number   [id number])   RO # 21   RO Serial #   [id number])   Water Hardness NA   pH 7.2   Pressure Test Verified Yes   Alarms Verified Passed   Machine Temperature 98.6 F (37 C)   Alarms Verified Yes   Na+ mEq (Machine) 138 mEq   Bicarb mEq (Machine) 35 mEq   Hemodialysis Conductivity (Machine) 13.8   Hemodialysis Conductivity (Meter) 13.8   Dialyzer Lot Number [id number]   Tubing Lot Number [id number]   RO Machine Log Completed Yes   Hepatitis Status   HBsAg (Antigen) Result Negative   HBsAg Date Drawn 07/10/19   HBsAg Repeat Draw Due Date 08/07/19   Dialysis Weight   Pre-Treatment Weight (Kg) 108.8   Scale Type ICU Bed Scale   Vitals   Temp 98.3 F (36.8 C)   Heart Rate 73   Resp Rate 18   BP 101/67   SpO2 99 %   O2 Device Nasal cannula   Assessment   Mental Status Alert;Oriented   Respiratory  WDL   Respiratory Pattern Regular   Bilateral Breath Sounds  Clear;Diminished   R Breath Sounds Clear;Diminished   L Breath Sounds Clear;Diminished   RLE Edema Non Pitting Edema   LLE Edema Non Pitting Edema   General Skin Color Appropriate for ethnicity   Skin Condition/Temp Warm;Dry   Abdomen Inspection Soft   GI Symptoms None   Mobility Bed   Permacath Catheter - Tunneled Right   No Placement Date or Time found.   Present on Admission?: Yes  Orientation: Right  Central Line Infection Prevention Education provided?: Yes   Line necessity reviewed? Apheresis/hemodialysis   Catheter Lumen Volume Venous 1.7 mL   Catheter Lumen Volume Arterial 1.7 mL   Dressing Status and Intervention Dressing Intact;Clean & Dry   Tego/Curos Caps on Catheter Yes   Line Care Connections checked and tightened   Dressing Type Transparent;Biopatch   Dressing Status Clean;Dry;Intact   Dressing Change Due 07/30/19   Pain Assessment   Charting Type Assessment   Pain Scale Used Numeric Scale (0-10)   Numeric Pain Scale   Pain Score 0   POSS Score 1   Hemodialysis Comments   Pre-Hemodialysis Comments Timeout and safet checks done

## 2019-07-26 NOTE — Progress Notes (Signed)
Pt is contact isolation,  PPE done per protocol.  Pt is alert and oriented. Pt denied pain and sob. No other complain given. Permacath access is optimally working post dialysis. Pt's BP soft during tx, Albumin 25% given as ordered X 2 for BP support.  Hemodialysis ended and pt. able to finish 3.5 hrs. tx.  Able to removed Net Fluid of 1.6L.  Reports were given to Primary nurse.       07/26/19 1310   Treatment Summary   Time Off Machine 1300   Duration of Treatment (Hours) 3.5   Treatment Type 1:1   Dialyzer Clearance Moderately streaked   Fluid Volume Off (mL) 2200   Prime Volume (mL) 200   Rinseback Volume (mL) 200   Fluid Given: Normal Saline (mL) 0   Fluid Given: PRBC  0 mL   Fluid Given: Albumin (mL) 200   Fluid Given: Other (mL) 0   Total Fluid Given 600   Hemodialysis Net Fluid Removed 1600   Post Treatment Assessment   Post-Treatment Weight (Kg) -1.6   Patient Response to Treatment Fairly tolerated   Additional Dialyzer Used 0   Permacath Catheter - Tunneled Right   No Placement Date or Time found.   Present on Admission?: Yes  Orientation: Right  Central Line Infection Prevention Education provided?: Yes   Line necessity reviewed? Apheresis/hemodialysis   Catheter Lumen Volume Venous 1.7 mL   Catheter Lumen Volume Arterial 1.7 mL   Dressing Status and Intervention Dressing Intact;Clean & Dry   Tego/Curos Caps on Catheter Yes   NEW Tego/Curos Caps placed (Date) 07/26/19   End Caps Free From Blood Yes   Line Care Connections checked and tightened   Dressing Type Transparent;Biopatch   Dressing Status Clean;Dry;Intact   Dressing Change Due 07/30/19   Vitals   Heart Rate (!) 56   Resp Rate 18   BP 148/73   SpO2 100 %   O2 Device Nasal cannula   Assessment   Mental Status Alert;Oriented   Respiratory  WDL   Respiratory Pattern Regular   Bilateral Breath Sounds Clear;Diminished   R Breath Sounds Clear;Diminished   L Breath Sounds Clear;Diminished   RLE Edema Non Pitting Edema   LLE Edema Non Pitting Edema    General Skin Color Appropriate for ethnicity   Skin Condition/Temp Warm;Dry   Abdomen Inspection Soft   GI Symptoms None   Mobility Bed   Pain Assessment   Charting Type Assessment   Pain Scale Used Numeric Scale (0-10)   Numeric Pain Scale   Pain Score 0   POSS Score 1   Education   Person taught Patient   Knowledge basis Minimal   Topics taught Access care;S&S of infection;Fluid Management;K+   Teaching Tools Explain   Reponse Verbalizes Understanding   Bedside Nurse Communication   Name of bedside RN - post dialysis Patsi Sears, RN.

## 2019-07-26 NOTE — Plan of Care (Addendum)
Pt is alert and oriented x4. Pt was drowsy this AM complained of pressure on the chest while on dialysis , EKG done, EKG strips in the chat, MD has been notified. 1.6L removed from dialysis today. Vitals are stable. Wound dressing changed. Call light within reach. Will continue to monitor.     Problem: Moderate/High Fall Risk Score >5  Goal: Patient will remain free of falls  Outcome: Progressing  Flowsheets (Taken 07/26/2019 1351)  High (Greater than 13):   HIGH-Initiate use of floor mats as appropriate   HIGH-Apply yellow "Fall Risk" arm band   HIGH-Bed alarm on at all times while patient in bed   HIGH-Consider use of low bed     Problem: Safety  Goal: Patient will be free from injury during hospitalization  Outcome: Progressing  Flowsheets (Taken 07/26/2019 1351)  Patient will be free from injury during hospitalization:   Assess patient's risk for falls and implement fall prevention plan of care per policy   Use appropriate transfer methods   Include patient/ family/ care giver in decisions related to safety   Provide alternative method of communication if needed (communication boards, writing)   Ensure appropriate safety devices are available at the bedside   Assess for patients risk for elopement and implement Elopement Risk Plan per policy   Hourly rounding   Provide and maintain safe environment  Goal: Patient will be free from infection during hospitalization  Outcome: Progressing  Flowsheets (Taken 07/26/2019 1351)  Free from Infection during hospitalization:   Assess and monitor for signs and symptoms of infection   Monitor all insertion sites (i.e. indwelling lines, tubes, urinary catheters, and drains)   Encourage patient and family to use good hand hygiene technique   Monitor lab/diagnostic results

## 2019-07-26 NOTE — Plan of Care (Signed)
Pt is AxOx4, pleasant and cooperative. Denies any n/v. Contact isolation maintained. Xray was completed to R arm/wrist, PRN Oxycodone was given x1 for pain to R arm. Wound care was completed to Abd as ordered. Permacath site is clean, dry and intact. No BM this shift. SSI given as ordered. VSS. Pt resting comfortably, no other distress noted. Safety precautions are in place and call bell is within reach.     Problem: Safety  Goal: Patient will be free from injury during hospitalization  Outcome: Progressing  Flowsheets (Taken 07/26/2019 0122)  Patient will be free from injury during hospitalization:   Assess patient's risk for falls and implement fall prevention plan of care per policy   Provide and maintain safe environment   Use appropriate transfer methods   Ensure appropriate safety devices are available at the bedside   Include patient/ family/ care giver in decisions related to safety  Goal: Patient will be free from infection during hospitalization  Outcome: Progressing  Flowsheets (Taken 07/26/2019 0122)  Free from Infection during hospitalization:   Assess and monitor for signs and symptoms of infection   Monitor lab/diagnostic results   Monitor all insertion sites (i.e. indwelling lines, tubes, urinary catheters, and drains)   Encourage patient and family to use good hand hygiene technique     Problem: Pain  Goal: Pain at adequate level as identified by patient  Outcome: Progressing  Flowsheets (Taken 07/26/2019 0122)  Pain at adequate level as identified by patient:   Identify patient comfort function goal   Assess for risk of opioid induced respiratory depression, including snoring/sleep apnea. Alert healthcare team of risk factors identified.   Reassess pain within 30-60 minutes of any procedure/intervention, per Pain Assessment, Intervention, Reassessment (AIR) Cycle   Assess pain on admission, during daily assessment and/or before any "as needed" intervention(s)   Evaluate if patient comfort  function goal is met   Evaluate patient's satisfaction with pain management progress   Offer non-pharmacological pain management interventions     Problem: Psychosocial and Spiritual Needs  Goal: Demonstrates ability to cope with hospitalization/illness  Outcome: Progressing  Flowsheets (Taken 07/26/2019 0122)  Demonstrates ability to cope with hospitalizations/illness:   Encourage verbalization of feelings/concerns/expectations   Provide quiet environment   Assist patient to identify own strengths and abilities   Encourage patient to set small goals for self   Encourage participation in diversional activity   Reinforce positive adaptation of new coping behaviors   Include patient/ patient care companion in decisions     Problem: Compromised Tissue integrity  Goal: Damaged tissue is healing and protected  Outcome: Progressing  Flowsheets (Taken 07/26/2019 0122)  Damaged tissue is healing and protected:   Monitor/assess Braden scale every shift   Provide wound care per wound care algorithm     Problem: Patient Receiving Advanced Renal Therapies  Goal: Therapy access site remains intact  Outcome: Progressing

## 2019-07-26 NOTE — Progress Notes (Signed)
[facility] PROGRESS NOTE  Aaron Edelman, x [phone] [facility] Spectralink)      Date Time: 07/26/19 7:44 AM  Patient Name: [name]  Attending Physician: Lisa Roca, MD    CC: follow-up ESRD, provision of hemodialysis    Assessment:     1. ESRD on HD MWF at [facility], Dr. Aline August  2. Hyperkalemia - resolved  3. Hypotension - on midodrine  - neg stim test  4. Anemia in CKD  5. Mild metabolic acidosis - resolved    Recommendations:   1. HD today then Monday to return to her outpt MWF schedule  Stable from renal standpoint      Case discussed with: patient    Corky Mull, MD  [facility]  [phone] (office)  X [phone] (FFX Spectra-Link)      Subjective: no sob  Still w/pleuritic chest pain    Review of Systems:   Cardio: chest pain, no edema  Pulm: no SOB  GI: no abdominal pain  MSK: right hand pain, decreased range of motion    Physical Exam:     Vitals:    07/25/19 1925 07/25/19 2340 07/26/19 0317 07/26/19 0643   BP: 109/65 111/56 120/74 128/60   Pulse: 70 70 70 73   Resp: 18 16 17 16    Temp: 98.2 F (36.8 C) 97.2 F (36.2 C) 98.1 F (36.7 C) 98.4 F (36.9 C)   TempSrc: Oral Oral Oral Oral   SpO2: 97% 98% 98% 97%   Weight:   108.8 kg (239 lb 13.8 oz)    Height:           Intake and Output Summary (Last 24 hours) at Date Time    Intake/Output Summary (Last 24 hours) at 07/26/2019 0744  Last data filed at 07/25/2019 1800  Gross per 24 hour   Intake 827 ml   Output --   Net 827 ml       General: awake, alert, oriented x 3, no acute distress.  Cardiovascular: regular rate and rhythm  Lungs: clear to auscultation bilaterally, bilateral air entry, normal work of breathing  Abdomen: soft, NT, ND  Extremities: no edema    Access:  R IJ HDTC    Meds:      Scheduled Meds: PRN Meds:    atorvastatin, 40 mg, Oral, Daily  collagenase, , Topical, Q24H SCH  darbepoetin alfa, 60 mcg, Subcutaneous, Weekly  doxycycline, 100 mg, Oral, Q12H SCH  insulin lispro, 1-6 Units, Subcutaneous, QHS  insulin  lispro, 2-10 Units, Subcutaneous, TID AC  lactobacillus/streptococcus, 1 capsule, Oral, Daily  lidocaine, 1 patch, Transdermal, Q24H SCH  midodrine, 10 mg, Oral, TID MEALS  pantoprazole, 40 mg, Oral, QAM AC  sevelamer, 800 mg, Oral, TID MEALS  sodium chloride, 250 mL, Intravenous, Once  sodium hypochlorite, , Irrigation, Q12H          Continuous Infusions:   sodium chloride, , PRN  sodium chloride, , PRN  acetaminophen, 650 mg, Q6H PRN   Or  acetaminophen, 650 mg, Q6H PRN  dextrose, 15 g of glucose, PRN   And  dextrose, 12.5 g, PRN   And  glucagon (rDNA), 1 mg, PRN  melatonin, 3 mg, QHS PRN  naloxone, 0.2 mg, PRN  ondansetron, 4 mg, Q6H PRN   Or  ondansetron, 4 mg, Q6H PRN  oxyCODONE, 10 mg, Q3H PRN              Labs:     Recent Labs   Lab 07/26/19  0321 07/25/19  0358 07/23/19  0444   WBC 9.53* 9.10 7.88   Hgb 8.1* 8.0* 7.7*   Hematocrit 27.2* 27.3* 24.8*   Platelets 240 211 179     Recent Labs   Lab 07/23/19  0444 07/22/19  0335 07/22/19  0031 07/21/19  0858 07/20/19  2052   Sodium 137 137 135*  More results in Results Review 133*   Potassium 4.3 5.0 5.0  More results in Results Review 6.2*   Chloride 100 98* 96*  More results in Results Review 95*   CO2 22 21* 20*  More results in Results Review 23   BUN 57.0* 96.0* 96.0*  More results in Results Review 91.0*   Creatinine 6.1* 9.7* 9.5*  More results in Results Review 9.1*   Calcium 8.9 8.1* 8.7  More results in Results Review 8.9   Albumin 3.3* 3.3*  --   --  2.8*   Phosphorus 6.1* 8.7*  --   --   --    Magnesium 2.2 2.3  --   --   --    Glucose 164* 139* 154*  More results in Results Review 212*   EGFR 8.4 4.9 5.0  More results in Results Review 5.3   More results in Results Review = values in this interval not displayed.     : XR Forearm Complete Right    Result Date: 07/26/2019   1.  No fracture or dislocation. 2.  Nonspecific soft tissue swelling about the wrist. 3.  Calcification in the region of the TFCC may be seen in the setting of pyrophosphate  arthropathy (CPPD). Clinical correlation is recommended. Cynda Familia, MD  07/26/2019 7:33 AM    XR Wrist Right PA And Lateral    Result Date: 07/26/2019   1.  No fracture or dislocation. 2.  Nonspecific soft tissue swelling about the wrist. 3.  Calcification in the region of the TFCC may be seen in the setting of pyrophosphate arthropathy (CPPD). Clinical correlation is recommended. Cynda Familia, MD  07/26/2019 7:33 AM          Signed by: Corky Mull, MD, FASN

## 2019-07-26 NOTE — Progress Notes (Signed)
[facility]  PROGRESS NOTE      Patient: [name]  Date: 07/26/2019   LOS: 5 Days  Admission Date: 07/20/2019   MRN: [id number]  Attending: Lisa Roca  Please contact me on the following Spectralink [phone]       ASSESSMENT/PLAN     [name] is a 64 y.o. female admitted with Shortness of breath    Interval Summary:     Patient Active Hospital Problem List:  CHF/fluid overload.  Patient is status post dialysis and ultrafiltration today.  Repeat dialysis and ultrafiltration in a.m.      Pleuritic chest pain  Relieved by sitting up or leaning forward  This is consistent with a clinical diagnosis of acute pericarditis  Would add colchicine to regimen  Close clinical monitoring    End-stage renal disease  Continue dialysis nephrology..    Anemia  Due to CKD/ESRD  Status post blood transfusion and Aranesp  Will continue to follow CBCs closely    Somnolence   This has resolved with discontinuing gabapentin   Resume at a lower dose in the next 24 to 48 hours     Right forearm pain   X-rays revealed no evidence of acute fracture      Pericardial effusion  Patient seen by [place]   Repeat echocardiogram shows a small to moderate effusion no evidence of tamponade no further cardiac work-up at present time  Colchicine for pericarditis    Abdominal wound with MRSA, Enterococcus and Pseudomonas  Wound cultures positive for MRSA currently on doxycycline  Appreciate assistance from wound and ostomy nurse    Hypotension/shock  Probably due to hypovolemia and recent MDR infection   No evidence of tamponade.  This is resolved          Sleep apnea by history    Hyperlipidemia  Continue statins    Pneumonia  Clinically improved.    ACTH stimulation test normal no evidence of adrenal insufficiency      Discussed with patient and RN at bedside        Analgesia: Tylenol    Nutrition: Consistent carbohydrate cardiac diet    DVT Prophylaxis: Lovenox       Code Status: Full code    DISPO: to  Be  determined    Family Contact: Since daughter       SUBJECTIVE     [name] states she has chest pain sharp and improved with sitting up    MEDICATIONS     Current Facility-Administered Medications   Medication Dose Route Frequency    atorvastatin  40 mg Oral Daily    colchicine  0.6 mg Oral Once    collagenase   Topical Q24H SCH    darbepoetin alfa  60 mcg Subcutaneous Weekly    doxycycline  100 mg Oral Q12H SCH    insulin lispro  1-6 Units Subcutaneous QHS    insulin lispro  2-10 Units Subcutaneous TID AC    lactobacillus/streptococcus  1 capsule Oral Daily    lidocaine  1 patch Transdermal Q24H SCH    midodrine  10 mg Oral TID MEALS    pantoprazole  40 mg Oral QAM AC    sevelamer  800 mg Oral TID MEALS    sodium chloride  250 mL Intravenous Once    sodium hypochlorite   Irrigation Q12H       PHYSICAL EXAM     Vitals:    07/26/19 1733   BP: 128/62  Pulse: 72   Resp: 20   Temp: 98 F (36.7 C)   SpO2: 100%       Temperature: Temp  Min: 97.2 F (36.2 C)  Max: 98.4 F (36.9 C)  Pulse: Pulse  Min: 55  Max: 73  Respiratory: Resp  Min: 16  Max: 20  Non-Invasive BP: BP  Min: 95/65  Max: 148/73  Pulse Oximetry SpO2  Min: 97 %  Max: 100 %    Intake and Output Summary (Last 24 hours) at Date Time    Intake/Output Summary (Last 24 hours) at 07/26/2019 1806  Last data filed at 07/26/2019 1733  Gross per 24 hour   Intake 380 ml   Output 1600 ml   Net -1220 ml         GEN APPEARANCE: Drowsy but arousable A&OX3  HEENT: PERLA; EOMI; Conjunctiva Clear  NECK: Supple; No bruits  CVS: RRR, S1, S2; No M/G/R  LUNGS: CTAB; No Wheezes; No Rhonchi: No rales  ABD: Soft; No TTP; + Normoactive BS  EXT: No edema; Pulses 2+ and intact  Skin exam: Warm and dry  NEURO: CN 2-12 intact; No Focal neurological deficits  CAP REFILL: Less than 3 seconds capillary refill  MENTAL STATUS: Drowsy but arousable oriented x3 but slow to respond          LABS     Recent Labs   Lab 07/26/19  0321 07/25/19  0358 07/23/19  0444   WBC 9.53*  9.10 7.88   RBC 2.93* 2.92* 2.71*   Hgb 8.1* 8.0* 7.7*   Hematocrit 27.2* 27.3* 24.8*   MCV 92.8 93.5 91.5   Platelets 240 211 179       Recent Labs   Lab 07/26/19  0919 07/23/19  0444 07/22/19  0335 07/22/19  0031 07/21/19  2003   Sodium 138 137 137 135* 137   Potassium 4.7 4.3 5.0 5.0 5.0   Chloride 100 100 98* 96* 97*   CO2 23 22 21* 20* 20*   BUN 68.0* 57.0* 96.0* 96.0* 94.0*   Creatinine 7.8* 6.1* 9.7* 9.5* 9.4*   Glucose 235* 164* 139* 154* 164*   Calcium 8.6 8.9 8.1* 8.7 8.5   Magnesium  --  2.2 2.3  --   --        Recent Labs   Lab 07/23/19  0444 07/22/19  0335 07/20/19  2052   ALT  --   --  11   AST (SGOT)  --   --  10   Bilirubin, Total  --   --  0.3   Albumin 3.3* 3.3* 2.8*   Alkaline Phosphatase  --   --  109*       Recent Labs   Lab 07/26/19  1353 07/26/19  1045 07/21/19  1058   Troponin I <0.01 <0.01 0.01             Microbiology Results     Procedure Component Value Units Date/Time    Blood Culture Aerobic/Anaerobic #1 [[id number]] Collected: 07/20/19 2052    Specimen: Arm from Blood, Venipuncture Updated: 07/23/19 0221    Narrative:      ORDER#: [id number]                                    ORDERED BY: Randolm Idol  SOURCE: Blood, Venipuncture arm  COLLECTED:  07/20/19 20:52  ANTIBIOTICS AT COLL.:                                RECEIVED :  07/20/19 21:25  Culture Blood Aerobic and Anaerobic        PRELIM      07/23/19 02:21  07/22/19   No Growth after 1 day/s of incubation.  07/23/19   No Growth after 2 day/s of incubation.      Blood Culture Aerobic/Anaerobic #2 [[id number]] Collected: 07/20/19 2052    Specimen: Arm from Blood, Venipuncture Updated: 07/23/19 0221    Narrative:      ORDER#: [id number]                                    ORDERED BY: Randolm Idol  SOURCE: Blood, Venipuncture arm                      COLLECTED:  07/20/19 20:52  ANTIBIOTICS AT COLL.:                                RECEIVED :  07/20/19 21:25  Culture Blood Aerobic and Anaerobic        PRELIM      07/23/19  02:21  07/22/19   No Growth after 1 day/s of incubation.  07/23/19   No Growth after 2 day/s of incubation.      COVID-19 (SARS-COV-2) [facility]) [[id number]] Collected: 07/21/19 0152    Specimen: Nasopharyngeal Swab from Nasopharynx Updated: 07/21/19 0324     Purpose of COVID testing Screening     SARS-CoV-2 Specimen Source Nasopharyngeal     SARS CoV 2 Overall Result Negative     Comment: Test performed using the Abbott ID NOW EUA assay.  Please see Fact Sheets for patients and providers located at:  http://olson-hall.info/    This test is for the qualitative detection of SARS-CoV-2  (COVID19) nucleic acid. Viral nucleic acids may persist in vivo,  independent of viability. Detection of viral nucleic acid does  not imply the presence of infectious virus, or that virus  nucleic acid is the cause of clinical symptoms. Negative  results should be treated as presumptive and, if inconsistent  with clinical signs and symptoms or necessary for patient  management, should be tested with an alternative molecular  assay. Negative results do not preclude SARS-CoV-2 infection  and should not be used as the sole basis for patient  management decisions. Invalid results may be due to inhibiting  substances in the specimen and recollection should occur.         Narrative:      o Collect and clearly label specimen type:  o Upper respiratory specimen: One Nasopharyngeal Dry Swab NO  Transport Media.  o Hand deliver to laboratory ASAP  Indication for testing->Extended care facility admission to  semi private room  Screening    Culture + Gram Stain,Aerobic, Wound [[id number]] Collected: 07/22/19 1033    Specimen: Wound Updated: 07/23/19 1527    Narrative:      culturette  ORDER#: [id number]                                    ORDERED  BY: REINES, ERIC  SOURCE: Wound abdominal wall                         COLLECTED:  07/22/19 10:33  ANTIBIOTICS AT COLL.:                                RECEIVED :  07/22/19 13:37  ORDER  ENTRY COMMENTS:  culturette  Stain, Gram                                FINAL       07/22/19 15:20  07/22/19   Few WBCs             No Squamous epithelial cells seen             No organisms seen  Culture and Gram Stain, Aerobic, Wound     PRELIM      07/23/19 15:27   +  07/23/19   Light growth of Staphylococcus aureus               Further workup to follow including susceptibility testing        MRSA culture - Nares [[id number]] Collected: 07/21/19 1058    Specimen: Culturette from Nares Updated: 07/22/19 1117     Culture MRSA Surveillance Negative for Methicillin Resistant Staph aureus    MRSA culture - Throat [[id number]] Collected: 07/21/19 1058    Specimen: Culturette from Throat Updated: 07/22/19 1117     Culture MRSA Surveillance Negative for Methicillin Resistant Staph aureus           RADIOLOGY     Upon my review:    Prudy Feeler Belkys Henault  6:06 PM 07/26/2019

## 2019-07-26 NOTE — Plan of Care (Signed)
Problem: Renal Instability  Goal: Fluid and electrolyte balance are achieved/maintained  Outcome: Progressing  Flowsheets (Taken 07/26/2019 1008)  Fluid and electrolyte balance are achieved/maintained:   Monitor/assess lab values and report abnormal values   Monitor daily weight   Assess for confusion/personality changes     Problem: Patient Receiving Advanced Renal Therapies  Goal: Therapy access site remains intact  Outcome: Progressing  Flowsheets (Taken 07/26/2019 1008)  Therapy access site remains intact:   Assess therapy access site   Change therapy access site dressing as needed

## 2019-07-27 LAB — GLUCOSE WHOLE BLOOD - POCT
Whole Blood Glucose POCT: 150 mg/dL — ABNORMAL HIGH (ref 70–100)
Whole Blood Glucose POCT: 246 mg/dL — ABNORMAL HIGH (ref 70–100)
Whole Blood Glucose POCT: 256 mg/dL — ABNORMAL HIGH (ref 70–100)
Whole Blood Glucose POCT: 161 mg/dL — ABNORMAL HIGH (ref 70–100)

## 2019-07-27 LAB — ECG 12-LEAD
Atrial Rate: 69 {beats}/min
P Axis: 46 degrees
P-R Interval: 136 ms
Q-T Interval: 396 ms
QRS Duration: 74 ms
QTC Calculation (Bezet): 424 ms
R Axis: 54 degrees
T Axis: 86 degrees
Ventricular Rate: 69 {beats}/min

## 2019-07-27 MED ORDER — BISACODYL 5 MG PO TBEC
5.00 mg | DELAYED_RELEASE_TABLET | Freq: Every day | ORAL | Status: DC | PRN
Start: 2019-07-27 — End: 2019-07-31

## 2019-07-27 MED ORDER — LACTULOSE 10 GM/15ML PO SOLN
20.00 g | Freq: Two times a day (BID) | ORAL | Status: DC | PRN
Start: 2019-07-27 — End: 2019-07-31
  Filled 2019-07-27: qty 30

## 2019-07-27 MED ORDER — SENNA 8.6 MG PO TABS
8.6000 mg | ORAL_TABLET | Freq: Every evening | ORAL | Status: DC
Start: 2019-07-27 — End: 2019-07-31
  Administered 2019-07-27 – 2019-07-30 (×4): 8.6 mg via ORAL
  Filled 2019-07-27 (×4): qty 1

## 2019-07-27 NOTE — Progress Notes (Signed)
[facility]  PROGRESS NOTE      Patient: [name]  Date: 07/27/2019   LOS: 6 Days  Admission Date: 07/20/2019   MRN: [id number]  Attending: Lisa Roca  Please contact me on the following Spectralink [phone]       ASSESSMENT/PLAN     [name] is a 64 y.o. female admitted with Shortness of breath    Interval Summary:     Patient Active Hospital Problem List:  CHF/fluid overload.  Patient is status post dialysis and ultrafiltration today.  Repeat dialysis and ultrafiltration in a.m.      Pleuritic chest pain  Relieved by sitting up or leaning forward  This is consistent with a clinical diagnosis of acute pericarditiS  Has resolved with addition of colchicine  Outpatient follow-up with cardiology because of pericardial effusion    End-stage renal disease  Continue dialysis nephrology.. Following    Constipation  As needed oral Dulcolax added to regimen    PT OT evaluation follow-up with outpatient skilled nursing facility    Anemia  Due to CKD/ESRD  Status post blood transfusion and Aranesp  Will continue to follow CBCs closely    Somnolence   This has resolved with discontinuing gabapentin   Resume at a lower dose in the next 24 to 48 hours     Right forearm pain   X-rays revealed no evidence of acute fracture      Pericardial effusion  Patient seen by [place]   Repeat echocardiogram shows a small to moderate effusion no evidence of tamponade no further cardiac work-up at present time  Colchicine for pericarditis    Abdominal wound with MRSA, Enterococcus and Pseudomonas  Wound cultures positive for MRSA currently on doxycycline  Appreciate assistance from wound and ostomy nurse    Hypotension/shock  Probably due to hypovolemia and recent MDR infection   No evidence of tamponade.  This is resolved          Sleep apnea by history    Hyperlipidemia  Continue statins    Pneumonia  Clinically improved.    ACTH stimulation test normal no evidence of adrenal insufficiency      Discussed with  patient and RN at bedside        Analgesia: Tylenol    Nutrition: Consistent carbohydrate cardiac diet    DVT Prophylaxis: Lovenox       Code Status: Full code    DISPO: to  Be determined    Family Contact: Since daughter       SUBJECTIVE     [name] states she has chest pain sharp and improved with sitting up    MEDICATIONS     Current Facility-Administered Medications   Medication Dose Route Frequency    atorvastatin  40 mg Oral Daily    collagenase   Topical Q24H SCH    darbepoetin alfa  60 mcg Subcutaneous Weekly    doxycycline  100 mg Oral Q12H SCH    insulin lispro  1-6 Units Subcutaneous QHS    insulin lispro  2-10 Units Subcutaneous TID AC    lactobacillus/streptococcus  1 capsule Oral Daily    lidocaine  1 patch Transdermal Q24H SCH    midodrine  10 mg Oral TID MEALS    pantoprazole  40 mg Oral QAM AC    senna  8.6 mg Oral QHS    sevelamer  800 mg Oral TID MEALS    sodium chloride  250 mL Intravenous Once  sodium hypochlorite   Irrigation Q12H       PHYSICAL EXAM     Vitals:    07/27/19 1416   BP: 149/89   Pulse: 66   Resp: 18   Temp: 97.3 F (36.3 C)   SpO2: 98%       Temperature: Temp  Min: 97.3 F (36.3 C)  Max: 99.3 F (37.4 C)  Pulse: Pulse  Min: 63  Max: 71  Respiratory: Resp  Min: 16  Max: 18  Non-Invasive BP: BP  Min: 123/70  Max: 149/89  Pulse Oximetry SpO2  Min: 91 %  Max: 98 %    Intake and Output Summary (Last 24 hours) at Date Time    Intake/Output Summary (Last 24 hours) at 07/27/2019 1841  Last data filed at 07/27/2019 1805  Gross per 24 hour   Intake 850 ml   Output 700 ml   Net 150 ml         GEN APPEARANCE: Drowsy but arousable A&OX3  HEENT: PERLA; EOMI; Conjunctiva Clear  NECK: Supple; No bruits  CVS: RRR, S1, S2; No M/G/R  LUNGS: CTAB; No Wheezes; No Rhonchi: No rales  ABD: Soft; No TTP; + Normoactive BS  EXT: No edema; Pulses 2+ and intact  Skin exam: Warm and dry  NEURO: CN 2-12 intact; No Focal neurological deficits  CAP REFILL: Less than 3 seconds capillary  refill  MENTAL STATUS: Drowsy but arousable oriented x3 but slow to respond          LABS     Recent Labs   Lab 07/26/19  0321 07/25/19  0358 07/23/19  0444   WBC 9.53* 9.10 7.88   RBC 2.93* 2.92* 2.71*   Hgb 8.1* 8.0* 7.7*   Hematocrit 27.2* 27.3* 24.8*   MCV 92.8 93.5 91.5   Platelets 240 211 179       Recent Labs   Lab 07/26/19  0919 07/23/19  0444 07/22/19  0335 07/22/19  0031 07/21/19  2003   Sodium 138 137 137 135* 137   Potassium 4.7 4.3 5.0 5.0 5.0   Chloride 100 100 98* 96* 97*   CO2 23 22 21* 20* 20*   BUN 68.0* 57.0* 96.0* 96.0* 94.0*   Creatinine 7.8* 6.1* 9.7* 9.5* 9.4*   Glucose 235* 164* 139* 154* 164*   Calcium 8.6 8.9 8.1* 8.7 8.5   Magnesium  --  2.2 2.3  --   --        Recent Labs   Lab 07/23/19  0444 07/22/19  0335 07/20/19  2052   ALT  --   --  11   AST (SGOT)  --   --  10   Bilirubin, Total  --   --  0.3   Albumin 3.3* 3.3* 2.8*   Alkaline Phosphatase  --   --  109*       Recent Labs   Lab 07/26/19  1353 07/26/19  1045 07/21/19  1058   Troponin I <0.01 <0.01 0.01             Microbiology Results     Procedure Component Value Units Date/Time    Blood Culture Aerobic/Anaerobic #1 [[id number]] Collected: 07/20/19 2052    Specimen: Arm from Blood, Venipuncture Updated: 07/23/19 0221    Narrative:      ORDER#: [id number]  ORDERED BY: THOMAS, SASHA  SOURCE: Blood, Venipuncture arm                      COLLECTED:  07/20/19 20:52  ANTIBIOTICS AT COLL.:                                RECEIVED :  07/20/19 21:25  Culture Blood Aerobic and Anaerobic        PRELIM      07/23/19 02:21  07/22/19   No Growth after 1 day/s of incubation.  07/23/19   No Growth after 2 day/s of incubation.      Blood Culture Aerobic/Anaerobic #2 [[id number]] Collected: 07/20/19 2052    Specimen: Arm from Blood, Venipuncture Updated: 07/23/19 0221    Narrative:      ORDER#: [id number]                                    ORDERED BY: Randolm Idol  SOURCE: Blood, Venipuncture arm                       COLLECTED:  07/20/19 20:52  ANTIBIOTICS AT COLL.:                                RECEIVED :  07/20/19 21:25  Culture Blood Aerobic and Anaerobic        PRELIM      07/23/19 02:21  07/22/19   No Growth after 1 day/s of incubation.  07/23/19   No Growth after 2 day/s of incubation.      COVID-19 (SARS-COV-2) [facility]) [[id number]] Collected: 07/21/19 0152    Specimen: Nasopharyngeal Swab from Nasopharynx Updated: 07/21/19 0324     Purpose of COVID testing Screening     SARS-CoV-2 Specimen Source Nasopharyngeal     SARS CoV 2 Overall Result Negative     Comment: Test performed using the Abbott ID NOW EUA assay.  Please see Fact Sheets for patients and providers located at:  http://olson-hall.info/    This test is for the qualitative detection of SARS-CoV-2  (COVID19) nucleic acid. Viral nucleic acids may persist in vivo,  independent of viability. Detection of viral nucleic acid does  not imply the presence of infectious virus, or that virus  nucleic acid is the cause of clinical symptoms. Negative  results should be treated as presumptive and, if inconsistent  with clinical signs and symptoms or necessary for patient  management, should be tested with an alternative molecular  assay. Negative results do not preclude SARS-CoV-2 infection  and should not be used as the sole basis for patient  management decisions. Invalid results may be due to inhibiting  substances in the specimen and recollection should occur.         Narrative:      o Collect and clearly label specimen type:  o Upper respiratory specimen: One Nasopharyngeal Dry Swab NO  Transport Media.  o Hand deliver to laboratory ASAP  Indication for testing->Extended care facility admission to  semi private room  Screening    Culture + Gram Stain,Aerobic, Wound [[id number]] Collected: 07/22/19 1033    Specimen: Wound Updated: 07/23/19 1527    Narrative:      culturette  ORDER#: [id number]  ORDERED BY: REINES,  ERIC  SOURCE: Wound abdominal wall                         COLLECTED:  07/22/19 10:33  ANTIBIOTICS AT COLL.:                                RECEIVED :  07/22/19 13:37  ORDER ENTRY COMMENTS:  culturette  Stain, Gram                                FINAL       07/22/19 15:20  07/22/19   Few WBCs             No Squamous epithelial cells seen             No organisms seen  Culture and Gram Stain, Aerobic, Wound     PRELIM      07/23/19 15:27   +  07/23/19   Light growth of Staphylococcus aureus               Further workup to follow including susceptibility testing        MRSA culture - Nares [[id number]] Collected: 07/21/19 1058    Specimen: Culturette from Nares Updated: 07/22/19 1117     Culture MRSA Surveillance Negative for Methicillin Resistant Staph aureus    MRSA culture - Throat [[id number]] Collected: 07/21/19 1058    Specimen: Culturette from Throat Updated: 07/22/19 1117     Culture MRSA Surveillance Negative for Methicillin Resistant Staph aureus           RADIOLOGY     Upon my review:    Shaune Pascal  6:41 PM 07/27/2019

## 2019-07-27 NOTE — Plan of Care (Signed)
Ax o 4. VSS. SR 60s. PIV flushed and patent. ABD dressing CDI. 2 person assistance with repositioning in bed. Denies any pain. HD access noted to rt chest wall with dressing CDI. Call bell within reach and side rails up x 3. Contact isolation precautions taken.   Problem: Moderate/High Fall Risk Score >5  Goal: Patient will remain free of falls  Outcome: Progressing  Flowsheets (Taken 07/26/2019 1351 by Patsi Sears, RN)  High (Greater than 13):   HIGH-Initiate use of floor mats as appropriate   HIGH-Apply yellow "Fall Risk" arm band   HIGH-Bed alarm on at all times while patient in bed   HIGH-Consider use of low bed     Problem: Safety  Goal: Patient will be free from injury during hospitalization  Outcome: Progressing  Goal: Patient will be free from infection during hospitalization  Outcome: Progressing  Flowsheets (Taken 07/26/2019 1351 by Patsi Sears, RN)  Free from Infection during hospitalization:   Assess and monitor for signs and symptoms of infection   Monitor all insertion sites (i.e. indwelling lines, tubes, urinary catheters, and drains)   Encourage patient and family to use good hand hygiene technique   Monitor lab/diagnostic results     Problem: Pain  Goal: Pain at adequate level as identified by patient  Outcome: Progressing  Flowsheets (Taken 07/26/2019 0122 by Shane Crutch, RN)  Pain at adequate level as identified by patient:   Identify patient comfort function goal   Assess for risk of opioid induced respiratory depression, including snoring/sleep apnea. Alert healthcare team of risk factors identified.   Reassess pain within 30-60 minutes of any procedure/intervention, per Pain Assessment, Intervention, Reassessment (AIR) Cycle   Assess pain on admission, during daily assessment and/or before any "as needed" intervention(s)   Evaluate if patient comfort function goal is met   Evaluate patient's satisfaction with pain management progress   Offer non-pharmacological pain management  interventions     Problem: Side Effects from Pain Analgesia  Goal: Patient will experience minimal side effects of analgesic therapy  Outcome: Progressing     Problem: Discharge Barriers  Goal: Patient will be discharged home or other facility with appropriate resources  Outcome: Progressing     Problem: Psychosocial and Spiritual Needs  Goal: Demonstrates ability to cope with hospitalization/illness  Outcome: Progressing     Problem: Compromised Tissue integrity  Goal: Damaged tissue is healing and protected  Outcome: Progressing  Flowsheets (Taken 07/26/2019 0122 by Shane Crutch, RN)  Damaged tissue is healing and protected:   Monitor/assess Braden scale every shift   Provide wound care per wound care algorithm  Goal: Nutritional status is improving  Outcome: Progressing     Problem: Renal Instability  Goal: Fluid and electrolyte balance are achieved/maintained  Outcome: Progressing  Flowsheets (Taken 07/26/2019 1008 by Lendell Caprice, RN)  Fluid and electrolyte balance are achieved/maintained:   Monitor/assess lab values and report abnormal values   Monitor daily weight   Assess for confusion/personality changes  Goal: Nutritional intake is adequate  Outcome: Progressing     Problem: Patient Receiving Advanced Renal Therapies  Goal: Therapy access site remains intact  Outcome: Progressing

## 2019-07-27 NOTE — Progress Notes (Signed)
[facility] PROGRESS NOTE  Aaron Edelman, x [phone] [facility] Spectralink)      Date Time: 07/27/19 7:41 AM  Patient Name: [name]  Attending Physician: Lisa Roca, MD    CC: follow-up ESRD, provision of hemodialysis    Assessment:     1. ESRD on HD MWF at [facility], Dr. Aline August  2. Hyperkalemia - resolved  3. Hypotension - on midodrine  - neg stim test  4. Anemia in CKD  5. Mild metabolic acidosis - resolved    Recommendations:   1. HD tomorrow return to her outpt MWF schedule  Stable from renal standpoint      Case discussed with: patient    Corky Mull, MD  [facility]  [phone] (office)  X [phone] (FFX Spectra-Link)      Subjective: arm pain improved    Review of Systems:   Cardio: chest pain, no edema  Pulm: no SOB  GI: no abdominal pain  MSK: right hand pain, decreased range of motion    Physical Exam:     Vitals:    07/26/19 1912 07/27/19 0011 07/27/19 0514 07/27/19 0630   BP: 132/74 141/70 129/71 123/70   Pulse: 71 63 65 65   Resp: 16 18 17 18    Temp: 99.3 F (37.4 C) 99.3 F (37.4 C) 98.8 F (37.1 C) 98.2 F (36.8 C)   TempSrc: Axillary Oral Oral Oral   SpO2: 91% 97% 95% 96%   Weight:       Height:           Intake and Output Summary (Last 24 hours) at Date Time    Intake/Output Summary (Last 24 hours) at 07/27/2019 0741  Last data filed at 07/26/2019 1733  Gross per 24 hour   Intake 380 ml   Output 1600 ml   Net -1220 ml       General: awake, alert, oriented x 3, no acute distress.  Cardiovascular: regular rate and rhythm  Lungs: clear to auscultation bilaterally, bilateral air entry, normal work of breathing  Abdomen: soft, NT, ND  Extremities: no edema    Access:  R IJ HDTC    Meds:      Scheduled Meds: PRN Meds:    atorvastatin, 40 mg, Oral, Daily  collagenase, , Topical, Q24H SCH  darbepoetin alfa, 60 mcg, Subcutaneous, Weekly  doxycycline, 100 mg, Oral, Q12H SCH  insulin lispro, 1-6 Units, Subcutaneous, QHS  insulin lispro, 2-10 Units, Subcutaneous, TID  AC  lactobacillus/streptococcus, 1 capsule, Oral, Daily  lidocaine, 1 patch, Transdermal, Q24H SCH  midodrine, 10 mg, Oral, TID MEALS  pantoprazole, 40 mg, Oral, QAM AC  sevelamer, 800 mg, Oral, TID MEALS  sodium chloride, 250 mL, Intravenous, Once  sodium hypochlorite, , Irrigation, Q12H          Continuous Infusions:   sodium chloride, , PRN  sodium chloride, , PRN  acetaminophen, 650 mg, Q6H PRN   Or  acetaminophen, 650 mg, Q6H PRN  dextrose, 15 g of glucose, PRN   And  dextrose, 12.5 g, PRN   And  glucagon (rDNA), 1 mg, PRN  melatonin, 3 mg, QHS PRN  naloxone, 0.2 mg, PRN  ondansetron, 4 mg, Q6H PRN   Or  ondansetron, 4 mg, Q6H PRN  oxyCODONE, 10 mg, Q3H PRN              Labs:     Recent Labs   Lab 07/26/19  0321 07/25/19  0358 07/23/19  0444   WBC  9.53* 9.10 7.88   Hgb 8.1* 8.0* 7.7*   Hematocrit 27.2* 27.3* 24.8*   Platelets 240 211 179     Recent Labs   Lab 07/26/19  0919 07/23/19  0444 07/22/19  0335 07/21/19  0858 07/20/19  2052   Sodium 138 137 137  More results in Results Review 133*   Potassium 4.7 4.3 5.0  More results in Results Review 6.2*   Chloride 100 100 98*  More results in Results Review 95*   CO2 23 22 21*  More results in Results Review 23   BUN 68.0* 57.0* 96.0*  More results in Results Review 91.0*   Creatinine 7.8* 6.1* 9.7*  More results in Results Review 9.1*   Calcium 8.6 8.9 8.1*  More results in Results Review 8.9   Albumin  --  3.3* 3.3*  --  2.8*   Phosphorus  --  6.1* 8.7*  --   --    Magnesium  --  2.2 2.3  --   --    Glucose 235* 164* 139*  More results in Results Review 212*   EGFR 6.3 8.4 4.9  More results in Results Review 5.3   More results in Results Review = values in this interval not displayed.     : No results found.        Signed by: Corky Mull, MD, FASN

## 2019-07-27 NOTE — Plan of Care (Addendum)
Pt is alert and oriented x4. Pt denies pain. Vitals WNL. Call light within reach.Wound care done, dressing C/D/I. Will continue to monitor pt.     Problem: Moderate/High Fall Risk Score >5  Goal: Patient will remain free of falls  Outcome: Progressing  Flowsheets (Taken 07/27/2019 1119)  High (Greater than 13):   HIGH-Initiate use of floor mats as appropriate   HIGH-Apply yellow "Fall Risk" arm band   HIGH-Consider use of low bed   HIGH-Pharmacy to initiate evaluation and intervention per protocol   HIGH-Bed alarm on at all times while patient in bed     Problem: Safety  Goal: Patient will be free from injury during hospitalization  Outcome: Progressing  Flowsheets (Taken 07/27/2019 1119)  Patient will be free from injury during hospitalization:   Assess patient's risk for falls and implement fall prevention plan of care per policy   Use appropriate transfer methods   Include patient/ family/ care giver in decisions related to safety   Assess for patients risk for elopement and implement Elopement Risk Plan per policy   Provide alternative method of communication if needed (communication boards, writing)   Ensure appropriate safety devices are available at the bedside   Hourly rounding   Provide and maintain safe environment  Goal: Patient will be free from infection during hospitalization  Outcome: Progressing  Flowsheets (Taken 07/27/2019 1119)  Free from Infection during hospitalization:   Assess and monitor for signs and symptoms of infection   Encourage patient and family to use good hand hygiene technique   Monitor lab/diagnostic results   Monitor all insertion sites (i.e. indwelling lines, tubes, urinary catheters, and drains)

## 2019-07-28 LAB — GLUCOSE WHOLE BLOOD - POCT
Whole Blood Glucose POCT: 192 mg/dL — ABNORMAL HIGH (ref 70–100)
Whole Blood Glucose POCT: 144 mg/dL — ABNORMAL HIGH (ref 70–100)
Whole Blood Glucose POCT: 203 mg/dL — ABNORMAL HIGH (ref 70–100)
Whole Blood Glucose POCT: 172 mg/dL — ABNORMAL HIGH (ref 70–100)

## 2019-07-28 MED ORDER — COLCHICINE 0.6 MG PO TABS
0.6000 mg | ORAL_TABLET | ORAL | 0 refills | Status: DC
Start: 2019-07-28 — End: 2020-01-06

## 2019-07-28 MED ORDER — DAKINS (1/4 STRENGTH) 0.125 % EX SOLN
Freq: Every day | CUTANEOUS | 0 refills | Status: DC
Start: 2019-07-28 — End: 2020-01-06

## 2019-07-28 MED ORDER — COLCHICINE 0.6 MG PO TABS
0.6000 mg | ORAL_TABLET | ORAL | 0 refills | Status: DC
Start: 2019-07-28 — End: 2019-07-28

## 2019-07-28 MED ORDER — BISACODYL 5 MG PO TBEC
5.00 mg | DELAYED_RELEASE_TABLET | Freq: Every day | ORAL | Status: DC | PRN
Start: 2019-07-28 — End: 2020-10-20

## 2019-07-28 MED ORDER — DARBEPOETIN ALFA 60 MCG/0.3ML IJ SOSY
60.00 ug | PREFILLED_SYRINGE | INTRAMUSCULAR | Status: DC
Start: 2019-07-30 — End: 2020-10-20

## 2019-07-28 MED ORDER — SODIUM CHLORIDE 0.9 % IV BOLUS
250.0000 mL | INTRAVENOUS | Status: AC | PRN
Start: 2019-07-28 — End: 2019-07-28

## 2019-07-28 MED ORDER — ONDANSETRON 4 MG PO TBDP
4.00 mg | ORAL_TABLET | Freq: Four times a day (QID) | ORAL | Status: DC | PRN
Start: 2019-07-28 — End: 2020-10-20

## 2019-07-28 MED ORDER — DOXYCYCLINE MONOHYDRATE 100 MG PO CAPS
100.00 mg | ORAL_CAPSULE | Freq: Two times a day (BID) | ORAL | 0 refills | Status: AC
Start: 2019-07-28 — End: 2019-08-03

## 2019-07-28 MED ORDER — OXYCODONE HCL 10 MG PO TABS
10.00 mg | ORAL_TABLET | ORAL | 0 refills | Status: AC | PRN
Start: 2019-07-28 — End: 2019-08-04

## 2019-07-28 MED ORDER — ACETAMINOPHEN 325 MG PO TABS
650.0000 mg | ORAL_TABLET | Freq: Four times a day (QID) | ORAL | Status: DC | PRN
Start: 2019-07-28 — End: 2020-10-20

## 2019-07-28 MED ORDER — ALBUMIN HUMAN 25 % IV SOLN
100.0000 mL | INTRAVENOUS | Status: AC | PRN
Start: 2019-07-28 — End: 2019-07-28
  Administered 2019-07-28: 10:00:00 100 mL via INTRAVENOUS
  Filled 2019-07-28: qty 100

## 2019-07-28 MED ORDER — SODIUM CHLORIDE 0.9 % IV BOLUS
100.0000 mL | INTRAVENOUS | Status: AC | PRN
Start: 2019-07-28 — End: 2019-07-28

## 2019-07-28 MED ORDER — SENNA 8.6 MG PO TABS
8.6000 mg | ORAL_TABLET | Freq: Every evening | ORAL | Status: DC
Start: 2019-07-28 — End: 2020-01-06

## 2019-07-28 MED ORDER — MELATONIN 3 MG PO TABS
3.0000 mg | ORAL_TABLET | Freq: Every evening | ORAL | 0 refills | Status: DC | PRN
Start: 2019-07-28 — End: 2020-10-20

## 2019-07-28 MED ORDER — MIDODRINE HCL 10 MG PO TABS
10.0000 mg | ORAL_TABLET | Freq: Three times a day (TID) | ORAL | Status: DC
Start: 2019-07-28 — End: 2020-01-06

## 2019-07-28 NOTE — Plan of Care (Addendum)
Pt A&0 x 4. Drowsy in AM but more alert in PM. Moderate assist with ADL's. HD done. Permacath right chest intact. Dressing C/D/I. Dressing to abd C/D/I. Pt c/o neck pain. Tylenol given and warm pack applied with good results. No c/o chest pain voiced. Call light within reach. Fall bundle in place. VSS. Discharge orders received. Pt to be discharged to SNF when arrangements are made.   Problem: Moderate/High Fall Risk Score >5  Goal: Patient will remain free of falls  Outcome: Progressing     Problem: Safety  Goal: Patient will be free from injury during hospitalization  Outcome: Progressing     Problem: Pain  Goal: Pain at adequate level as identified by patient  Outcome: Progressing     Problem: Compromised Tissue integrity  Goal: Damaged tissue is healing and protected  Outcome: Progressing     Problem: Renal Instability  Goal: Nutritional intake is adequate  Outcome: Progressing     Problem: Patient Receiving Advanced Renal Therapies  Goal: Therapy access site remains intact  Outcome: Progressing

## 2019-07-28 NOTE — PT Eval Note (Signed)
[facility]  Physical Therapy Attempt Note    Patient:  [name] MRN#:  [id number]  Unit:  [facility] 4 NORTH Room/Bed:  A4025/A4025.01      Physical Therapy evaluation attempted on 07/28/2019 at 9:15 AM unable to complete secondary to RN requesting hold at this time 2/2 patient just starting dialysis. Therapy will f/u in late pm as appropriate.      RN aware.    Lamar Sprinkles, PT, DPT   07/28/2019 9:16 AM

## 2019-07-28 NOTE — Progress Notes (Signed)
[facility]  PROGRESS NOTE      Patient: [name]  Date: 07/28/2019   LOS: 7 Days  Admission Date: 07/20/2019   MRN: [id number]  Attending: Lisa Roca  Please contact me on the following Spectralink [phone]       ASSESSMENT/PLAN     [name] is a 64 y.o. female admitted with Shortness of breath    Interval Summary:     Patient Active Hospital Problem List:  CHF/fluid overload.  Patient is status post dialysis and ultrafiltration today.  Repeat dialysis and ultrafiltration in a.m.      Pleuritic chest pain  Relieved by sitting up or leaning forward  This is consistent with a clinical diagnosis of acute pericarditiS  Has resolved with addition of colchicine  Outpatient follow-up with cardiology     End-stage renal disease  Continue dialysis nephrology.. Following  Stable from renal standpoint    Constipation  As needed oral Dulcolax added to regimen    PT OT evaluation follow-up with outpatient skilled nursing facility    Anemia  Due to CKD/ESRD  Status post blood transfusion and Aranesp  Will continue to follow CBCs closely    Somnolence   This has resolved with discontinuing gabapentin   Resume at a lower dose in the next 24 to 48 hours     Right forearm pain   X-rays revealed no evidence of acute fracture          Pericardial effusion  Patient seen by [place]   Repeat echocardiogram shows a small to moderate effusion no evidence of tamponade no further cardiac work-up at present time  Colchicine for pericarditis    Abdominal wound with MRSA, Enterococcus and Pseudomonas  Wound cultures positive for MRSA currently on doxycycline  Appreciate assistance from wound and ostomy nurse    Hypotension/shock  Probably due to hypovolemia and recent MDR infection   No evidence of tamponade.  This is resolved          Sleep apnea by history    Hyperlipidemia  Continue statins    Pneumonia  Clinically improved.    ACTH stimulation test normal no evidence of adrenal insufficiency      Discussed  with patient and RN at bedside    Patient stable for discharge today we will have case management pursue return to [facility]        Analgesia: Tylenol    Nutrition: Consistent carbohydrate cardiac diet    DVT Prophylaxis: Lovenox       Code Status: Full code    DISPO: to  Be determined    Family Contact: Since daughter       SUBJECTIVE     [name] states she has chest pain sharp and improved with sitting up    MEDICATIONS     Current Facility-Administered Medications   Medication Dose Route Frequency    atorvastatin  40 mg Oral Daily    collagenase   Topical Q24H [place]    darbepoetin alfa  60 mcg Subcutaneous Weekly    doxycycline  100 mg Oral Q12H [place]    insulin lispro  1-6 Units Subcutaneous QHS    insulin lispro  2-10 Units Subcutaneous TID AC    lactobacillus/streptococcus  1 capsule Oral Daily    lidocaine  1 patch Transdermal Q24H SCH    midodrine  10 mg Oral TID MEALS    pantoprazole  40 mg Oral QAM AC    senna  8.6  mg Oral QHS    sevelamer  800 mg Oral TID MEALS    sodium chloride  250 mL Intravenous Once    sodium hypochlorite   Irrigation Q12H       PHYSICAL EXAM     Vitals:    07/28/19 1300   BP: 123/63   Pulse: 60   Resp: 18   Temp:    SpO2: 100%       Temperature: Temp  Min: 97.3 F (36.3 C)  Max: 98.4 F (36.9 C)  Pulse: Pulse  Min: 58  Max: 71  Respiratory: Resp  Min: 16  Max: 20  Non-Invasive BP: BP  Min: 93/63  Max: 157/72  Pulse Oximetry SpO2  Min: 96 %  Max: 100 %    Intake and Output Summary (Last 24 hours) at Date Time    Intake/Output Summary (Last 24 hours) at 07/28/2019 1315  Last data filed at 07/28/2019 0600  Gross per 24 hour   Intake 460 ml   Output 700 ml   Net -240 ml         GEN APPEARANCE: Drowsy but arousable A&OX3  HEENT: PERLA; EOMI; Conjunctiva Clear  NECK: Supple; No bruits  CVS: RRR, S1, S2; No M/G/R  LUNGS: CTAB; No Wheezes; No Rhonchi: No rales  ABD: Soft; No TTP; + Normoactive BS  EXT: No edema; Pulses 2+ and intact  Skin exam: Warm and dry  NEURO: CN 2-12  intact; No Focal neurological deficits  CAP REFILL: Less than 3 seconds capillary refill  MENTAL STATUS: Drowsy but arousable oriented x3 but slow to respond          LABS     Recent Labs   Lab 07/26/19  0321 07/25/19  0358 07/23/19  0444   WBC 9.53* 9.10 7.88   RBC 2.93* 2.92* 2.71*   Hgb 8.1* 8.0* 7.7*   Hematocrit 27.2* 27.3* 24.8*   MCV 92.8 93.5 91.5   Platelets 240 211 179       Recent Labs   Lab 07/26/19  0919 07/23/19  0444 07/22/19  0335 07/22/19  0031 07/21/19  2003   Sodium 138 137 137 135* 137   Potassium 4.7 4.3 5.0 5.0 5.0   Chloride 100 100 98* 96* 97*   CO2 23 22 21* 20* 20*   BUN 68.0* 57.0* 96.0* 96.0* 94.0*   Creatinine 7.8* 6.1* 9.7* 9.5* 9.4*   Glucose 235* 164* 139* 154* 164*   Calcium 8.6 8.9 8.1* 8.7 8.5   Magnesium  --  2.2 2.3  --   --        Recent Labs   Lab 07/23/19  0444 07/22/19  0335   Albumin 3.3* 3.3*       Recent Labs   Lab 07/26/19  1353 07/26/19  1045   Troponin I <0.01 <0.01             Microbiology Results     Procedure Component Value Units Date/Time    Blood Culture Aerobic/Anaerobic #1 [[id number]] Collected: 07/20/19 2052    Specimen: Arm from Blood, Venipuncture Updated: 07/23/19 0221    Narrative:      ORDER#: [id number]                                    ORDERED BY: Randolm Idol  SOURCE: Blood, Venipuncture arm  COLLECTED:  07/20/19 20:52  ANTIBIOTICS AT COLL.:                                RECEIVED :  07/20/19 21:25  Culture Blood Aerobic and Anaerobic        PRELIM      07/23/19 02:21  07/22/19   No Growth after 1 day/s of incubation.  07/23/19   No Growth after 2 day/s of incubation.      Blood Culture Aerobic/Anaerobic #2 [[id number]] Collected: 07/20/19 2052    Specimen: Arm from Blood, Venipuncture Updated: 07/23/19 0221    Narrative:      ORDER#: [id number]                                    ORDERED BY: Randolm Idol  SOURCE: Blood, Venipuncture arm                      COLLECTED:  07/20/19 20:52  ANTIBIOTICS AT COLL.:                                 RECEIVED :  07/20/19 21:25  Culture Blood Aerobic and Anaerobic        PRELIM      07/23/19 02:21  07/22/19   No Growth after 1 day/s of incubation.  07/23/19   No Growth after 2 day/s of incubation.      COVID-19 (SARS-COV-2) [employer] [facility]) [[id number]] Collected: 07/21/19 0152    Specimen: Nasopharyngeal Swab from Nasopharynx Updated: 07/21/19 0324     Purpose of COVID testing Screening     SARS-CoV-2 Specimen Source Nasopharyngeal     SARS CoV 2 Overall Result Negative     Comment: Test performed using the Abbott ID NOW EUA assay.  Please see Fact Sheets for patients and providers located at:  http://olson-hall.info/    This test is for the qualitative detection of SARS-CoV-2  (COVID19) nucleic acid. Viral nucleic acids may persist in vivo,  independent of viability. Detection of viral nucleic acid does  not imply the presence of infectious virus, or that virus  nucleic acid is the cause of clinical symptoms. Negative  results should be treated as presumptive and, if inconsistent  with clinical signs and symptoms or necessary for patient  management, should be tested with an alternative molecular  assay. Negative results do not preclude SARS-CoV-2 infection  and should not be used as the sole basis for patient  management decisions. Invalid results may be due to inhibiting  substances in the specimen and recollection should occur.         Narrative:      o Collect and clearly label specimen type:  o Upper respiratory specimen: One Nasopharyngeal Dry Swab NO  Transport Media.  o Hand deliver to laboratory ASAP  Indication for testing->Extended care facility admission to  semi private room  Screening    Culture + Gram Stain,Aerobic, Wound [[id number]] Collected: 07/22/19 1033    Specimen: Wound Updated: 07/23/19 1527    Narrative:      culturette  ORDER#: [id number]                                    ORDERED  BY: REINES, ERIC  SOURCE: Wound abdominal wall                         COLLECTED:  07/22/19  10:33  ANTIBIOTICS AT COLL.:                                RECEIVED :  07/22/19 13:37  ORDER ENTRY COMMENTS:  culturette  Stain, Gram                                FINAL       07/22/19 15:20  07/22/19   Few WBCs             No Squamous epithelial cells seen             No organisms seen  Culture and Gram Stain, Aerobic, Wound     PRELIM      07/23/19 15:27   +  07/23/19   Light growth of Staphylococcus aureus               Further workup to follow including susceptibility testing        MRSA culture - Nares [[id number]] Collected: 07/21/19 1058    Specimen: Culturette from Nares Updated: 07/22/19 1117     Culture MRSA Surveillance Negative for Methicillin Resistant Staph aureus    MRSA culture - Throat [[id number]] Collected: 07/21/19 1058    Specimen: Culturette from Throat Updated: 07/22/19 1117     Culture MRSA Surveillance Negative for Methicillin Resistant Staph aureus           RADIOLOGY     Upon my review:    Prudy Feeler Letisha Yera  1:15 PM 07/28/2019

## 2019-07-28 NOTE — Plan of Care (Signed)
Pt A&Ox4. Pt complained pain on mid chest & R neck, Oxycodone given & heat pad applied, pt stated it helped with the pain. Pt is on NC 1L, O2 Sat: 96%, shortness of breath noticed, increasing with deep inspiration. Normal sinus rhythm on tele.  Stable vital sign. Pt has sling on R arm. No coverage insulin given last night per sliding scale. Abdominal dressing changed, small serosanguinous noticed. Permacath dressing was dry, clean, intact. External catheter applied. Pt ambulated to the bathroom with a walker & one person assist. Pt is on dialysis, oliguric. Maintained safety precaution.  Problem: Safety  Goal: Patient will be free from injury during hospitalization  Outcome: Progressing  Flowsheets (Taken 07/28/2019 0206)  Patient will be free from injury during hospitalization:   Assess patient's risk for falls and implement fall prevention plan of care per policy   Use appropriate transfer methods   Ensure appropriate safety devices are available at the bedside   Provide and maintain safe environment   Hourly rounding   Include patient/ family/ care giver in decisions related to safety     Problem: Pain  Goal: Pain at adequate level as identified by patient  Outcome: Progressing  Flowsheets (Taken 07/28/2019 0206)  Pain at adequate level as identified by patient:   Identify patient comfort function goal   Assess for risk of opioid induced respiratory depression, including snoring/sleep apnea. Alert healthcare team of risk factors identified.   Assess pain on admission, during daily assessment and/or before any "as needed" intervention(s)   Reassess pain within 30-60 minutes of any procedure/intervention, per Pain Assessment, Intervention, Reassessment (AIR) Cycle   Evaluate if patient comfort function goal is met   Offer non-pharmacological pain management interventions   Evaluate patient's satisfaction with pain management progress     Problem: Renal Instability  Goal: Fluid and electrolyte balance are  achieved/maintained  Outcome: Progressing  Flowsheets (Taken 07/28/2019 0206)  Fluid and electrolyte balance are achieved/maintained:   Monitor intake and output every shift   Monitor/assess lab values and report abnormal values   Provide adequate hydration   Assess and reassess fluid and electrolyte status   Observe for cardiac arrhythmias   Monitor for muscle weakness   Assess for confusion/personality changes   Monitor daily weight

## 2019-07-28 NOTE — Progress Notes (Signed)
Pt is contact isolation,  PPE done per protocol.  Pt alert and oriented.   Pt denied pain and sob.  R permacath access is optimally working. Timeouts and safety checks done. Will closely monitor the pt.       07/28/19 0920   Bedside Nurse Communication   Name of bedside RN - pre dialysis Roberta Meenderinck, RN   Treatment Initiation- With Dialysis Precautions   Time Out/Safety Check Completed Yes   Consent for HD signed for this hospitalization (Date) 05/23/19   Consent for HD signed for this hospitalization (Time) 1044   Blood Consent Verified N/A   Dialysis Precautions All Connections Secured;Saline Line Double Clamped;Venous Parameters Set;Arterial Parameters Set;Air Foam Detecctor Engaged   Dialysis Treatment Type Routine;Bedside   Special Considerations contact isolation   Is patient diabetic? Yes   RO/Hemodialysis Architectural technologist   Is Total Chlorine less than 0.1 ppm? Yes   Orignial Total Chlorine Testing Time 0845   At 4 Hour Total Chlorine Testing Time 1245   RO/Hemodialysis Facilities manager Number 3    Machine Serial Number   [id number])   RO # 21   RO Serial #   [id number])   Water Hardness NA   pH 7   Pressure Test Verified Yes   Alarms Verified Passed   Machine Temperature 98.6 F (37 C)   Alarms Verified Yes   Na+ mEq (Machine) 138 mEq   Bicarb mEq (Machine) 35 mEq   Hemodialysis Conductivity (Machine) 13.8   Hemodialysis Conductivity (Meter) 13.8   Dialyzer Lot Number [id number]   Tubing Lot Number [id number]   RO Machine Log Completed Yes   Hepatitis Status   HBsAg (Antigen) Result Negative   HBsAg Date Drawn 07/10/19   HBsAg Repeat Draw Due Date 08/07/19   Dialysis Weight   Pre-Treatment Weight (Kg) 108   Scale Type ICU Bed Scale   Vitals   Temp 98.4 F (36.9 C)   Heart Rate 69   Resp Rate 18   BP 121/76   SpO2 99 %   O2 Device Nasal cannula   Assessment   Mental Status Alert;Oriented   Respiratory Pattern Regular   Bilateral Breath Sounds Diminished   R Breath Sounds Diminished    L Breath Sounds Diminished   RLE Edema Non Pitting Edema   LLE Edema Non Pitting Edema   General Skin Color Appropriate for ethnicity   Skin Condition/Temp Warm;Dry   Abdomen Inspection Soft;Rounded   GI Symptoms None   Mobility Bed   Permacath Catheter - Tunneled Right   No Placement Date or Time found.   Present on Admission?: Yes  Orientation: Right  Central Line Infection Prevention Education provided?: Yes   Line necessity reviewed? Apheresis/hemodialysis   Catheter Lumen Volume Venous 1.7 mL   Catheter Lumen Volume Arterial 1.7 mL   Dressing Status and Intervention Dressing Intact;Clean & Dry   Dressing Type Transparent;Biopatch   Dressing Status Clean;Dry;Intact   Dressing Change Due 07/30/19   Pain Assessment   Charting Type Assessment   Pain Scale Used Numeric Scale (0-10)   Numeric Pain Scale   Pain Score 0   POSS Score 1   Hemodialysis Comments   Pre-Hemodialysis Comments Timeout and safety checks done

## 2019-07-28 NOTE — OT Eval Note (Signed)
[facility]  Occupational Therapy Evaluation and Treatment    Patient: [name]  MRN#: [id number]   Unit: [facility] 4 NORTH  Bed: A4025/A4025.01    Time of Evaluation and Treatment:  Time Calculation  OT Received On: 07/28/19  Start Time: 1628  Stop Time: 1654  Time Calculation (min): 26 min    Chart Review and Collaboration with Care Team: 9 minutes, not included in above time.    Evaluation: 8 minutes  Treatment:  18 minutes    OT Visit Number: 1    Consult received for [name] for OT Evaluation and Treatment.  Patients medical condition is appropriate for Occupational therapy intervention at this time.    Activity Orders:  OT eval and treat and progressive mobility protocol    Precautions and Contraindications:  Precautions  Weight Bearing Status:  (R wrist triquetral fx 06/25/19)  Other Precautions: Falls    Personal Protective Equipment (PPE)  gloves, procedure gown, procedure mask and eye shield/covering    Medical Diagnosis:  Shortness of breath [R06.02]    History of Present Illness:  [name] is a 64 y.o. female admitted on 07/20/2019 with stomach pains, chest pain, shortness of breath and generalized body aches for the past 2 days prior to admission. Pt dx with acute pericarditis.    Patient Active Problem List   Diagnosis    Iron deficiency anemia secondary to inadequate dietary iron intake    Sideropenic dysphagia    Peritoneal dialysis catheter dysfunction, initial encounter    Peritonitis    Upper GI bleed    ESRD (end stage renal disease) on dialysis    Generalized abdominal pain    Thrombocytopenia    Hypotension    Type 2 diabetes mellitus, with long-term current use of insulin    GERD (gastroesophageal reflux disease)    Bowel incontinence    Right sided numbness    Uncontrolled type 2 diabetes mellitus with hyperglycemia    Hypertension    Hyperlipidemia    History of COPD    Gastritis with hemorrhage    History of anemia  due to chronic kidney disease    Open wound, abdominal wall, lateral, subsequent encounter    Gout    Dizziness    Fall    History of CVA (cerebrovascular accident)    Shortness of breath        Past Medical/Surgical History:  Past Medical History:   Diagnosis Date    Chronic obstructive pulmonary disease     Diabetes mellitus     Hyperlipidemia     Hypertension     Sleep apnea 2018     Past Surgical History:   Procedure Laterality Date    EGD, BIOPSY N/A 05/14/2019    Procedure: EGD, BIOPSY;  Surgeon: Ronie Spies, MD;  Location: [facility];  Service: Gastroenterology;  Laterality: N/A;    EXPLORATORY LAPAROTOMY N/A 05/27/2019    Procedure: EXPLORATORY LAPAROTOMY;  Surgeon: Herbert Moors., MD;  Location: [facility];  Service: General;  Laterality: N/A;    JOINT REPLACEMENT      shoulder    JOINT REPLACEMENT      knee    OVARY SURGERY Left     Removal    REMOVAL, FOREIGN BODY N/A 05/27/2019    Procedure: REMOVAL, PERITONEAL DIALYSIS CATHETER;  Surgeon: Herbert Moors., MD;  Location: [facility];  Service: General;  Laterality: N/A;    TUNNELED CATH CHECK/CHANGE (  [facility]ATH) N/A 07/03/2019    Procedure: TUNNELED CATH CHECK/CHANGE;  Surgeon: Maureen Ralphs, MD;  Location: [facility];  Service: Interventional Radiology;  Laterality: N/A;    TUNNELED CATH PLACEMENT ([facility]ATH) Bilateral 05/23/2019    Procedure: TUNNELED CATH PLACEMENT;  Surgeon: Jacques Earthly, MD;  Location: [facility];  Service: Interventional Radiology;  Laterality: Bilateral;        X-Rays/Tests/Labs  Lab Results   Component Value Date/Time    HGB 8.1 (L) 07/26/2019 03:21 AM    HCT 27.2 (L) 07/26/2019 03:21 AM    K 4.7 07/26/2019 09:19 AM    NA 138 07/26/2019 09:19 AM    INR 1.1 07/01/2019 10:41 PM    TROPI <0.01 07/26/2019 01:53 PM    TROPI <0.01 07/26/2019 10:45 AM    TROPI 0.01 07/21/2019 10:58 AM    TROPI <0.01 07/21/2019 08:58 AM    TROPI 0.01 03/18/2006 06:10 PM       All imaging reviewed. Please see  chart for details      Social History:  Prior Level of Function  Prior level of function: Ambulates with assistive device, Needs assistance with ADLs  Assistive Device: Front wheel walker  Baseline Activity Level: Household ambulation  DME Currently at Home: Environmental consultant, [place] Arrangements: Alone  Type of Home: [place]: Media planner  DME Currently at Home: Environmental consultant, [place] - Notes / Comments: Pt reports she just moved here but hasn't spent time in her apt b/c she was admitted to rehab after prev. hosp.  Plans to return to finish therapy      Subjective:      Patient is agreeable to participation in the therapy session.      Pain Assessment  Pain Assessment: Numeric Scale (0-10)  Pain Score: 6-moderate pain  Pain Location: Abdomen  Pain Orientation: Right  Pain Intervention(s): Repositioned      Objective:      Observation of Patient/Vital Signs: 155/84; 60HR; 97% O2    Cognitive Status and Neuro Exam:  Cognition/Neuro Status  Orientation Level: Oriented X4  Behavior: calm, cooperative    Musculoskeletal Examination  Gross ROM  Right Upper Extremity ROM: within functional limits  Left Upper Extremity ROM: needs focused assessment  LUE ROM - Shoulder - % Reduced: reduced by 25%  Gross Strength  Right Upper Extremity Strength: 4/5  Left Upper Extremity Strength: 4-/5         Functional Mobility:  Mobility and Transfers  Supine to Sit: Contact Guard Assist  Sit to Supine: Minimal Assist (for LE)  Sit to Stand: Arts administrator Sitting Balance: Supervision  Static Standing Balance: Contact Guard Assist    Participation and Activity Tolerance  Participation and Endurance  Participation Effort: fair  Endurance: Tolerates < 10 min exercise with changes in vital signs    Educated the patient to role of occupational therapy, plan of care, goals of therapy and safety with mobility and ADLs, pursed lip breathing with verbalized  understanding.    Patient left in bed with alarm and all other medical equipment in place and call bell and all personal items/needs within reach.  RN notified of session outcome.      Assessment:  [name] is a 64 y.o. female admitted 07/20/2019. Patient would benefit from continued skilled OT to address deficits listed below and increase functional independence.     OT  Assessment: decreased strength;decreased ROM;balance deficits;decreased independence with ADLs;decreased independence with IADLs;decreased endurance/activity tolerance      Treatment:  Therapist facilitated supine to sit with Pt requiring CGA and use of grab bars. Pt sat EOB with SUP and no c/o dizziness. Pt declined to perform ADLs saying she had just done them.  Pt performed one STS with CGA and tolerated standing less than 30 sec before becoming dizzy. BP dropped to 125/52. Pt sat EOB and continued to feel dizzy. Pt able to scoot to [facility] with CGA. Pt required min a for LE during sit to supine. Therapist informed RN of outcome of session.     Rehabilitation Potential: Prognosis: Fair, With continued OT s/p acute discharge    Plan:  OT Frequency Recommended: 2-3x/wk   Treatment Interventions: ADL retraining, Functional transfer training, UE strengthening/ROM, Endurance training, Compensatory technique education, Patient/Family training, Equipment eval/education     PMP Activity: Step 4 - Dangle at Bedside (STS from EOB )  Distance Walked (ft) (Step 6,7): 0 Feet    Risks/benefits/POC discussed    Goals:  Time For Goal Achievement: by time of discharge  ADL Goals  Patient will dress lower body: Moderate Assist, with AE, by time of discharge, Not met  Patient will toilet: Moderate Assist, with AE, by time of discharge, Not met  Mobility and Transfer Goals  Pt will transfer bed to BSC: Moderate Assist, by time of discharge, Not met  Neuro Re-Ed Goals  Pt will sit at edge of bed: Modified Independent, for 15 minutes, to prepare for OOB tasks, by  time of discharge, Partly met (6 min EOB with SUP)                          DME Recommended for Discharge: [facility] bed, [facility], [place]  Discharge Recommendation: SNF      Dannial Monarch, OTR/L    Physical Medicine and [facility]  [phone] (PRN # changes daily)    07/28/2019  5:04 PM

## 2019-07-28 NOTE — Discharge Summary (Signed)
[facility]      Patient: [name]  Admission Date: 07/20/2019   DOB: 1955-10-23  Discharge Date: 07/28/2019    MRN: [id number]  Discharge Attending:Amelita Risinger C Jovan Young     Referring Physician: Pcp, None, Young  PCP: [name]       DISCHARGE SUMMARY     Discharge Information   Admission Diagnosis:   Shortness of breath    Discharge Diagnosis:   Active Young Problems    Diagnosis    Shortness of breath   Small to moderate pericardial effusion no evidence of tamponade  Hyperkalemia  End-stage renal disease on dialysis  Acute encephalopathy due to a combination of hypotension and effects of medications i.e. Neurontin and narcotics  Right abdominal walls abscess/cellulitis infection with MRSA (will be treated with a total of 10 days of doxycycline she has 6 more days to go)  Deconditioned  Obesity  Metabolic acidosis secondary to end-stage renal disease  Acute superficial venous thrombosis status post dialysis catheter  Recurrent mechanical falls  Normocytic anemia most likely secondary to anemia of chronic disease/CKD      Admission Condition: Guarded  Discharge Condition: Stable and improved  Consultants: Dr. Corky Young nephrology, Dr. Judge Young infectious diseases Dr. Amelia Young [place] [facility] cardiology, Dr.  Chase Young intensivist  Functional Status: Ambulatory  Discharged to: Skilled nursing facility    Discharge Medications:     Medication List      START taking these medications    acetaminophen 325 MG tablet  Commonly known as: TYLENOL  Take 2 tablets (650 mg total) by mouth every 6 (six) hours as needed for Pain or Fever     bisacodyl 5 MG EC tablet  Commonly known as: DULCOLAX  Take 1 tablet (5 mg total) by mouth daily as needed for Constipation     colchicine 0.6 MG tablet  Take 1 tablet (0.6 mg total) by mouth every 14 (fourteen) days Every 2 weeks next dose will be on 08/09/2019     darbepoetin alfa 60 MCG/0.3ML injection  Commonly known as: ARANESP  Inject 0.3 mLs (60 mcg total) into  the skin once a week  Start taking on: July 30, 2019     doxycycline 100 MG capsule  Commonly known as: MONODOX  Take 1 capsule (100 mg total) by mouth every 12 (twelve) hours for 6 days Treat till 08/03/2019     melatonin 3 mg tablet  Take 1 tablet (3 mg total) by mouth nightly as needed for Sleep     midodrine 10 MG tablet  Commonly known as: PROAMATINE  Take 1 tablet (10 mg total) by mouth 3 (three) times daily with meals     ondansetron 4 MG disintegrating tablet  Commonly known as: ZOFRAN-ODT  Take 1 tablet (4 mg total) by mouth every 6 (six) hours as needed for Nausea     senna 8.6 MG Tabs  Commonly known as: SENOKOT  Take 1 tablet (8.6 mg total) by mouth nightly        CHANGE how you take these medications    insulin aspart 100 UNIT/ML injection  Commonly known as: NovoLOG  Inject 5 Units into the skin 3 (three) times daily before meals  What changed: how much to take     oxyCODONE 10 MG immediate release tablet  Commonly known as: ROXICODONE  Take 1 tablet (10 mg total) by mouth every 3 (three) hours as needed for Pain  What changed:    medication strength  how much to take   when to take this     sodium hypochlorite 0.125 % Soln  Commonly known as: DAKIN'S QUARTER STRENGTH  Irrigate with as directed daily Dressing changes twice daily  What changed: additional instructions        CONTINUE taking these medications    atorvastatin 40 MG tablet  Commonly known as: LIPITOR     collagenase ointment  Commonly known as: SANTYL  Apply topically every 24 hours     famotidine 40 MG tablet  Commonly known as: PEPCID     lactobacillus/streptococcus Caps  Take 1 capsule by mouth daily     lidocaine 5 %  Commonly known as: LIDODERM  Place 1 patch onto the skin every 24 hours Remove & Discard patch within 12 hours or as directed by Young     nystatin powder  Commonly known as: NYSTOP  Apply 1 application topically 2 (two) times daily     pantoprazole 40 MG tablet  Commonly known as: PROTONIX  Take 1 tablet (40 mg total) by  mouth 2 (two) times daily     sevelamer 800 MG tablet  Commonly known as: RENVELA     Trulicity 5.95 GL/8.7FI Sopn  Generic drug: Dulaglutide     vitamin C 500 MG tablet  Commonly known as: ASCORBIC ACID  Take 1 tablet (500 mg total) by mouth daily     zinc Oxide 40 % Pste paste  Commonly known as: DESITIN  Apply topically every 6 (six) hours     zinc sulfate 220 (50 Zn) MG capsule  Commonly known as: ZINCATE  Take 1 capsule (220 mg total) by mouth daily        STOP taking these medications    amLODIPine 5 MG tablet  Commonly known as: NORVASC     carvedilol 25 MG tablet  Commonly known as: COREG     ciprofloxacin 250 MG tablet  Commonly known as: CIPRO     gabapentin 300 MG capsule  Commonly known as: NEURONTIN     insulin glargine 100 UNIT/ML injection  Commonly known as: LANTUS           Where to Get Your Medications      You can get these medications from any pharmacy    Bring a paper prescription for each of these medications   oxyCODONE 10 MG immediate release tablet     Information about where to get these medications is not yet available    Ask your nurse or doctor about these medications   acetaminophen 325 MG tablet   bisacodyl 5 MG EC tablet   colchicine 0.6 MG tablet   darbepoetin alfa 60 MCG/0.3ML injection   doxycycline 100 MG capsule   melatonin 3 mg tablet   midodrine 10 MG tablet   ondansetron 4 MG disintegrating tablet   senna 8.6 MG Tabs   sodium hypochlorite 0.125 % [facility] Course   Presentation History   [name] is a 64 year old African-American lady who has a history of end-stage renal disease and was recently discharged to [facility] skilled nursing facility for PT OT and also for ongoing dialysis  She was brought back to the emergency room with a chief complaint of dizziness and chest pain.  She was admitted to the Young and transferred to the ICU for episode of hypotension    See HPI for details.    Young Course (7  Days)   Patient was admitted to  the ICU and was seen in consultation by Dr. Chase Young intensivist as well as Dr. Amelia Young cardiology and echocardiogram was done which revealed evidence of a pericardial effusion but no evidence of tamponade  She was seen by nephrology Dr. Corky Young and associates and was initially treated with dialysis but no ultrafiltration because of hypotension  Patient was treated with crystalloids with improvement in her blood pressure readings  Broad-spectrum antibiotics were also added to the regimen as she had recently been treated for an abdominal wall infection  She was seen by Dr. Judge Young infectious diseases who assisted with antibiotic choice and duration of treatment  She improved with above measures and was transferred out of the ICU  She had a follow-up echocardiogram done which revealed no evidence of tamponade.  Antibiotics were continued and to repeat cultures came back positive for MRSA and her antibiotics were deescalated to oral doxycycline  The patient continued to recuperate on the telemetry unit however she was somewhat somnolent at some point and her gabapentin was felt to be the culprit and this was discontinued with improvement in her mentation  Patient has participated in physical therapy she has been transfused with 1 unit of packed RBCs for anemia as well as treated with hemodialysis and ultrafiltration and she is almost back to her baseline  She however requires ongoing PT OT and will be discharged to the [facility] skilled nursing facility for ongoing physical therapy  She will be followed by the nephrology team  She will need to follow-up with [place] or her primary cardiologist in [place] to repeat an echocardiogram in about a week  She will continue doxycycline for the next 6 days to complete a course of antibiotics as recommended by infectious diseases  She also continues to complain of chest pain which was relieved by a dose of colchicine  Because of her end-stage renal  disease and slow metabolism of colchicine in patients with end-stage renal disease as per guidelines she will she has received a dose of colchicine during the Young stay and will need another dose of colchicine on August 08, 2020  She may be treated with another dose of colchicine 2 weeks thereafter  Further management will be based on follow-up echocardiogram findings         Best Practices   Was the patient admitted with either a CHF Exacerbation or Pneumonia?  No      Progress Note/Physical Exam at Discharge     Subjective: Patient feels well and ready for discharge    Vitals:    07/28/19 1308 07/28/19 1432 07/28/19 2010 07/28/19 2059   BP: 135/78 103/53 166/72 169/75   Pulse: 60 62 65 60   Resp: 18 18     Temp: 97.5 F (36.4 C) 97.7 F (36.5 C) 98.6 F (37 C) 98.8 F (37.1 C)   TempSrc:  Oral Oral Oral   SpO2: 100% 100% 98% 99%   Weight:       Height:               General: Obese lady in no acute distress AAOx3  HEENT: perrla, eomi, sclera anicteric, OP: Clear, MMM  Neck: supple, FROM, no LAD  Cardiovascular: RRR, no m/r/g  Lungs: CTAB, no w/r/r  Abdomen: soft, +BS, NT/ND, no masses, no g/r  Extremities: no C/C/E  Skin: no rashes or lesions noted  Neuro: CN 2-12 intact; No Focal neurological deficits  Diagnostics     Labs/Studies Pending at Discharge: None    Last Labs   Recent Labs   Lab 07/26/19  0321 07/25/19  0358 07/23/19  0444   WBC 9.53* 9.10 7.88   RBC 2.93* 2.92* 2.71*   Hgb 8.1* 8.0* 7.7*   Hematocrit 27.2* 27.3* 24.8*   MCV 92.8 93.5 91.5   Platelets 240 211 179       Recent Labs   Lab 07/26/19  0919 07/23/19  0444 07/22/19  0335 07/22/19  0031   Sodium 138 137 137 135*   Potassium 4.7 4.3 5.0 5.0   Chloride 100 100 98* 96*   CO2 23 22 21* 20*   BUN 68.0* 57.0* 96.0* 96.0*   Creatinine 7.8* 6.1* 9.7* 9.5*   Glucose 235* 164* 139* 154*   Calcium 8.6 8.9 8.1* 8.7   Magnesium  --  2.2 2.3  --        Microbiology Results     Procedure Component Value Units Date/Time    Blood Culture  Aerobic/Anaerobic #1 [[id number]] Collected: 07/20/19 2052    Specimen: Arm from Blood, Venipuncture Updated: 07/26/19 0421    Narrative:      ORDER#: [id number]                                    ORDERED BY: Marcello Moores, SASHA  SOURCE: Blood, Venipuncture arm                      COLLECTED:  07/20/19 20:52  ANTIBIOTICS AT COLL.:                                RECEIVED :  07/20/19 21:25  Culture Blood Aerobic and Anaerobic        FINAL       07/26/19 04:21  07/26/19   No growth after 5 days of incubation.      Blood Culture Aerobic/Anaerobic #2 [[id number]] Collected: 07/20/19 2052    Specimen: Arm from Blood, Venipuncture Updated: 07/26/19 0421    Narrative:      ORDER#: [id number]                                    ORDERED BY: Randolm Idol  SOURCE: Blood, Venipuncture arm                      COLLECTED:  07/20/19 20:52  ANTIBIOTICS AT COLL.:                                RECEIVED :  07/20/19 21:25  Culture Blood Aerobic and Anaerobic        FINAL       07/26/19 04:21  07/26/19   No growth after 5 days of incubation.      COVID-19 (SARS-COV-2) [employer] [facility]) [[id number]] Collected: 07/21/19 0152    Specimen: Nasopharyngeal Swab from Nasopharynx Updated: 07/21/19 0324     Purpose of COVID testing Screening     SARS-CoV-2 Specimen Source Nasopharyngeal     SARS CoV 2 Overall Result Negative     Comment: Test performed using the Abbott ID NOW EUA assay.  Please see Fact Sheets for patients and  providers located at:  http://olson-hall.info/    This test is for the qualitative detection of SARS-CoV-2  (COVID19) nucleic acid. Viral nucleic acids may persist in vivo,  independent of viability. Detection of viral nucleic acid does  not imply the presence of infectious virus, or that virus  nucleic acid is the cause of clinical symptoms. Negative  results should be treated as presumptive and, if inconsistent  with clinical signs and symptoms or necessary for patient  management, should be tested with an alternative  molecular  assay. Negative results do not preclude SARS-CoV-2 infection  and should not be used as the sole basis for patient  management decisions. Invalid results may be due to inhibiting  substances in the specimen and recollection should occur.         Narrative:      o Collect and clearly label specimen type:  o Upper respiratory specimen: One Nasopharyngeal Dry Swab NO  Transport Media.  o Hand deliver to laboratory ASAP  Indication for testing->Extended care facility admission to  semi private room  Screening    Culture + Gram Stain,Aerobic, Wound [[id number]] Collected: 07/22/19 1033    Specimen: Wound Updated: 07/24/19 0707    Narrative:      culturette  ORDER#: [id number]                                    ORDERED BY: Lahoma Crocker, ERIC  SOURCE: Wound abdominal wall                         COLLECTED:  07/22/19 10:33  ANTIBIOTICS AT COLL.:                                RECEIVED :  07/22/19 13:37  ORDER ENTRY COMMENTS:  culturette  Stain, Gram                                FINAL       07/22/19 15:20  07/22/19   Few WBCs             No Squamous epithelial cells seen             No organisms seen  Culture and Gram Stain, Aerobic, Wound     FINAL       07/24/19 07:07   +  07/24/19   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            Previous quantitation was Light growth of, verified by [id number] at 12:43 on 07/23/19  _____________________________________________________________________________                                      MRSA        ANTIBIOTICS                     MIC  INTRP      _____________________________________________________________________________  Clindamycin                    <=0.5   S        Daptomycin                      <=  1    S        Erythromycin                    >4     R        Gentamicin                      <=2    S        Levofloxacin                     4     I        Linezolid                        2     S        Oxacillin                       >2     R  D1    Penicillin                       >1     R        Rifampin                       <=0.5   S        Tetracycline                   <=0.5   S        Trimethoprim/Sulfamethoxazole <=0.5/9  S        Vancomycin                       1     S          -----DRUG COMMENTS----------    D1:  Oxacillin sensitivity predicts sensitivity to cephalosporins.         [place] Antimicrobial Subcommittee 2016  _____________________________________________________________________________            S=SUSCEPTIBLE     I=INTERMEDIATE     R=RESISTANT                            N/S=NON-SUSCEPTIBLE  _____________________________________________________________________________      MRSA culture - Nares [[id number]] Collected: 07/21/19 1058    Specimen: Culturette from Nares Updated: 07/22/19 1117     Culture MRSA Surveillance Negative for Methicillin Resistant Staph aureus    MRSA culture - Throat [[id number]] Collected: 07/21/19 1058    Specimen: Culturette from Throat Updated: 07/22/19 1117     Culture MRSA Surveillance Negative for Methicillin Resistant Staph aureus           Patient Instructions   Discharge Diet: Consistent carbohydrate cardiac diet  Discharge Activity: As tolerated    Follow Up Appointment:  Follow-up Information     Nevin Bloodgood, Young Follow up in 2 day(s).    Specialty: Nephrology  Contact information:  [address]  [address] [address]  [phone]             Tarri Glenn, Young Follow up in 1 week(s).    Specialty: Cardiology  Why: Follow-up echocardiogram to evaluate for pericardial fluid  Contact information:  [address]  [address]  [place] [address]  [phone]  [name] .                  Time spent examining patient, discussing with patient/family regarding Young course, chart review, reconciling medications and discharge planning:   >  35   minutes.    Shaune Pascal    9:29 PM 07/28/2019

## 2019-07-28 NOTE — Progress Notes (Signed)
Pt is on contact isolation,  PPE done per protocol.  Pt is alert and oriented. Pt denied pain and sob. No other complain given. R Permacath access is optimally working post dialysis. BP soft during tx,  Albumin 25% given as ordered.  Hemodialysis ended and pt. able to finish 3.5hrs tx.  Able to removed Net Fluid of 2L.  Reports were given to Primary nurse.       07/28/19 1308   Treatment Summary   Time Off Machine 1300   Duration of Treatment (Hours) 3.5   Treatment Type 1:1   Dialyzer Clearance Moderately streaked   Fluid Volume Off (mL) 2650   Prime Volume (mL) 200   Rinseback Volume (mL) 250   Fluid Given: Normal Saline (mL) 100   Fluid Given: PRBC  0 mL   Fluid Given: Albumin (mL) 100   Fluid Given: Other (mL) 0   Total Fluid Given 650   Hemodialysis Net Fluid Removed 2000   Post Treatment Assessment   Post-Treatment Weight (Kg) 106   Patient Response to Treatment fairly tolerated   Additional Dialyzer Used 0   Permacath Catheter - Tunneled Right   No Placement Date or Time found.   Present on Admission?: Yes  Orientation: Right  Central Line Infection Prevention Education provided?: Yes   Line necessity reviewed? Apheresis/hemodialysis   Catheter Lumen Volume Venous 1.7 mL   Catheter Lumen Volume Arterial 1.7 mL   Dressing Status and Intervention Dressing Intact;Clean & Dry   Tego/Curos Caps on Catheter Yes   NEW Tego/Curos Caps placed (Date) 07/28/19   End Caps Free From Blood Yes   Line Care Connections checked and tightened   Dressing Type Transparent;Biopatch   Dressing Status Clean;Dry;Intact   Dressing/Line Intervention Caps changed   Line Used For Blood Draw No   Dressing Change Due 07/30/19   Vitals   Temp 97.5 F (36.4 C)   Heart Rate 60   Resp Rate 18   BP 135/78   SpO2 100 %   O2 Device Nasal cannula   Assessment   Mental Status Alert;Oriented   Respiratory Pattern Regular   Bilateral Breath Sounds Clear;Diminished   R Breath Sounds Clear;Diminished   L Breath Sounds Clear;Diminished   RLE Edema  Non Pitting Edema   LLE Edema Non Pitting Edema   General Skin Color Appropriate for ethnicity   Skin Condition/Temp Warm;Dry   Abdomen Inspection Soft;Rounded   GI Symptoms None   Mobility Bed   Pain Assessment   Charting Type Reassessment   Pain Scale Used Numeric Scale (0-10)   Numeric Pain Scale   Pain Score 0   POSS Score 1   Education   Person taught Patient   Knowledge basis Minimal   Topics taught Access care;S&S of infection;Fluid Management;K+;Procedure   Teaching Tools Explain   Reponse Verbalizes Understanding   Bedside Nurse Communication   Name of bedside RN - post dialysis Roberta Meenderinck, RN

## 2019-07-28 NOTE — Plan of Care (Signed)
Discharge Recommendation: SNF   DME Recommended for Discharge: [facility], Hospital bed, BSC, [facility]    OT Frequency Recommended: 2-3x/wk     Is PT evaluation indicated at this time? This patient is already on PT caseload.     PMP Activity: Step 4 - Dangle at Bedside (STS from EOB )  Distance Walked (ft) (Step 6,7): 0 Feet  (Please See Therapy Evaluation for device and assistance level needed)    Treatment Interventions: ADL retraining, Functional transfer training, UE strengthening/ROM, Endurance training, Compensatory technique education, Patient/Family training, Equipment eval/education       Goal Formulation: Patient  Time For Goal Achievement: by time of discharge  ADL Goals  Patient will dress lower body: Moderate Assist, with AE, by time of discharge, Not met  Patient will toilet: Moderate Assist, with AE, by time of discharge, Not met  Mobility and Transfer Goals  Pt will transfer bed to BSC: Moderate Assist, by time of discharge, Not met  Neuro Re-Ed Goals  Pt will sit at edge of bed: Modified Independent, for 15 minutes, to prepare for OOB tasks, by time of discharge, Partly met (6 min EOB with SUP)            Dannial Monarch, OTR/L    Physical Medicine and [facility]  [phone] (PRN # changes daily)    07/28/2019  5:12 PM

## 2019-07-28 NOTE — Discharge Instr - AVS First Page (Addendum)
Reason for your Hospital Admission:  Fluid around your heart this is pericardial effusion and low blood pressure      Instructions for after your discharge:  Take your medications and follow-up as recommended

## 2019-07-28 NOTE — Plan of Care (Signed)
Discharge Recommendation: SNF  DME Recommended for Discharge: No additional equipment/DME recommended at this time    Is an Occupational Therapy Evaluation Indicated at this time? Yes, an OT evaluation is indicated at this time.    Treatment/Interventions: Personnel officer, Functional transfer training, Patient/family training  PT Frequency: 2-3x/wk     PMP Activity: Step 4 - Dangle at Bedside  Distance Walked (ft) (Step 6,7): 10 Feet  (Please See Therapy Evaluation for device and assistance level needed)    Goals:   Goals  Goal Formulation: With patient  Time for Goal Acheivement: By time of discharge  Goals: Select goal  Pt Will Go Supine To Sit: independent  Pt Will Perform Sit To Supine: modified independent (w/ leg lifter if needed)  Pt Will Perform Sit to Stand: with supervision, to maximize functional mobility and independence  Pt Will Transfer Bed/Chair: with rolling walker, with supervision, to maximize functional mobility and independence  Pt Will Perform Home Exer Program: independent, to maximize functional mobility and independence

## 2019-07-28 NOTE — Plan of Care (Signed)
Problem: Renal Instability  Goal: Fluid and electrolyte balance are achieved/maintained  Outcome: Progressing  Flowsheets (Taken 07/28/2019 1254)  Fluid and electrolyte balance are achieved/maintained:   Monitor/assess lab values and report abnormal values   Monitor daily weight   Assess for confusion/personality changes  Goal: Nutritional intake is adequate  Outcome: Progressing  Flowsheets (Taken 07/28/2019 1254)  Nutritional intake is adequate:   Assist patient with meals/food selection   Monitor daily weights   Include patient/patient care companion in decisions related to nutrition     Problem: Patient Receiving Advanced Renal Therapies  Goal: Therapy access site remains intact  Outcome: Progressing  Flowsheets (Taken 07/28/2019 1254)  Therapy access site remains intact:   Assess therapy access site   Change therapy access site dressing as needed

## 2019-07-28 NOTE — Progress Notes (Signed)
[facility] PROGRESS NOTE  Aaron Edelman, x [phone] ([place])      Date Time: 07/28/19 8:07 AM  Patient Name: [name]  Attending Physician: Lisa Roca, MD    CC: follow-up ESRD, provision of hemodialysis    Assessment:     1. ESRD on HD MWF at [facility], Dr. Aline August  2. Hyperkalemia - resolved  3. Hypotension - on midodrine  - neg stim test  4. Anemia in CKD  5. Mild metabolic acidosis - resolved    Recommendations:   1. HD today and MWF  Stable from renal standpoint - will follow at  [place] discussed with: patient, attending    Corky Mull, MD  [facility]  [phone] (office)  X [phone] (FFX Spectra-Link)      Subjective: c/o discomfort due to permcath  No sob    Review of Systems:   Cardio: chest pain, no edema  Pulm: no SOB  GI: no abdominal pain  MSK: right hand pain, decreased range of motion    Physical Exam:     Vitals:    07/27/19 2101 07/28/19 0017 07/28/19 0529 07/28/19 0640   BP: 157/72 146/75 114/72 139/78   Pulse: 65 68 69 69   Resp: 18 18 18 18    Temp: 98.4 F (36.9 C) 98.1 F (36.7 C) 98 F (36.7 C) 98.2 F (36.8 C)   TempSrc: Oral Oral Oral Oral   SpO2: 98% 96% 97% 98%   Weight:       Height:           Intake and Output Summary (Last 24 hours) at Date Time    Intake/Output Summary (Last 24 hours) at 07/28/2019 0807  Last data filed at 07/28/2019 0600  Gross per 24 hour   Intake 1050 ml   Output 700 ml   Net 350 ml       General: awake, alert, oriented x 3, no acute distress.  Cardiovascular: regular rate and rhythm  Lungs: clear to auscultation bilaterally, bilateral air entry, normal work of breathing  Abdomen: soft, NT, ND  Extremities: no edema    Access:  R IJ HDTC    Meds:      Scheduled Meds: PRN Meds:    atorvastatin, 40 mg, Oral, Daily  collagenase, , Topical, Q24H SCH  darbepoetin alfa, 60 mcg, Subcutaneous, Weekly  doxycycline, 100 mg, Oral, Q12H SCH  insulin lispro, 1-6 Units, Subcutaneous, QHS  insulin lispro, 2-10 Units,  Subcutaneous, TID AC  lactobacillus/streptococcus, 1 capsule, Oral, Daily  lidocaine, 1 patch, Transdermal, Q24H SCH  midodrine, 10 mg, Oral, TID MEALS  pantoprazole, 40 mg, Oral, QAM AC  senna, 8.6 mg, Oral, QHS  sevelamer, 800 mg, Oral, TID MEALS  sodium chloride, 250 mL, Intravenous, Once  sodium hypochlorite, , Irrigation, Q12H          Continuous Infusions:   sodium chloride, , PRN  sodium chloride, , PRN  acetaminophen, 650 mg, Q6H PRN   Or  acetaminophen, 650 mg, Q6H PRN  bisacodyl, 5 mg, Daily PRN  dextrose, 15 g of glucose, PRN   And  dextrose, 12.5 g, PRN   And  glucagon (rDNA), 1 mg, PRN  lactulose, 20 g, BID PRN  melatonin, 3 mg, QHS PRN  naloxone, 0.2 mg, PRN  ondansetron, 4 mg, Q6H PRN   Or  ondansetron, 4 mg, Q6H PRN  oxyCODONE, 10 mg, Q3H PRN  Labs:     Recent Labs   Lab 07/26/19  0321 07/25/19  0358 07/23/19  0444   WBC 9.53* 9.10 7.88   Hgb 8.1* 8.0* 7.7*   Hematocrit 27.2* 27.3* 24.8*   Platelets 240 211 179     Recent Labs   Lab 07/26/19  0919 07/23/19  0444 07/22/19  0335   Sodium 138 137 137   Potassium 4.7 4.3 5.0   Chloride 100 100 98*   CO2 23 22 21*   BUN 68.0* 57.0* 96.0*   Creatinine 7.8* 6.1* 9.7*   Calcium 8.6 8.9 8.1*   Albumin  --  3.3* 3.3*   Phosphorus  --  6.1* 8.7*   Magnesium  --  2.2 2.3   Glucose 235* 164* 139*   EGFR 6.3 8.4 4.9     : No results found.        Signed by: Corky Mull, MD, FASN

## 2019-07-28 NOTE — Progress Notes (Signed)
[facility] #[id number] Resubmitted For Processing/Updated now [facility] #[id number].      Memory Argue, MSW  Social Worker Case Manager [facility]  [phone]

## 2019-07-28 NOTE — PT Eval Note (Signed)
[facility]  Physical Therapy Evaluation and Treatment    Patient: [name]  MRN#: [id number]  Unit: [facility] 4 NORTH  Bed: A4025/A4025.01    Time of Evaluation and Treatment:  Time Calculation  PT Received On: 07/28/19  Start Time: 1340  Stop Time: 1404  Time Calculation (min): 24 min    Evaluation Time: 15 minutes  Treatment Time: 9 minutes    Chart Review and Collaboration with Care Team: 5 minutes, not included in above time    PT Visit Number: 1    Consult received for [name] for PT Evaluation and Treatment.  Patients medical condition is appropriate for Physical therapy intervention at this time.    Activity Orders:  PT eval and treat    Precautions and Contraindications:  Precautions  Weight Bearing Status:  (R wrist triquetral Fx 06/25/2019*)  Other Precautions: Falls    Personal Protective Equipment (PPE)  gloves, procedure mask and pt wore procedure mask    Medical Diagnosis:  Shortness of breath [R06.02]    History of Present Illness:  [name] is a 64 y.o. female admitted on 07/20/2019 admitted from [facility] rehab w/ c/o generalized body aches, O2 sats low.   Pt w/ acute pericarditis.     Patient Active Problem List   Diagnosis    Iron deficiency anemia secondary to inadequate dietary iron intake    Sideropenic dysphagia    Peritoneal dialysis catheter dysfunction, initial encounter    Peritonitis    Upper GI bleed    ESRD (end stage renal disease) on dialysis    Generalized abdominal pain    Thrombocytopenia    Hypotension    Type 2 diabetes mellitus, with long-term current use of insulin    GERD (gastroesophageal reflux disease)    Bowel incontinence    Right sided numbness    Uncontrolled type 2 diabetes mellitus with hyperglycemia    Hypertension    Hyperlipidemia    History of COPD    Gastritis with hemorrhage    History of anemia due to chronic kidney disease    Open wound, abdominal wall, lateral, subsequent encounter    Gout     Dizziness    Fall    History of CVA (cerebrovascular accident)    Shortness of breath       Past Medical/Surgical History:  Past Medical History:   Diagnosis Date    Chronic obstructive pulmonary disease     Diabetes mellitus     Hyperlipidemia     Hypertension     Sleep apnea 2018     Past Surgical History:   Procedure Laterality Date    EGD, BIOPSY N/A 05/14/2019    Procedure: EGD, BIOPSY;  Surgeon: Ronie Spies, MD;  Location: [facility];  Service: Gastroenterology;  Laterality: N/A;    EXPLORATORY LAPAROTOMY N/A 05/27/2019    Procedure: EXPLORATORY LAPAROTOMY;  Surgeon: Herbert Moors., MD;  Location: [facility];  Service: General;  Laterality: N/A;    JOINT REPLACEMENT      shoulder    JOINT REPLACEMENT      knee    OVARY SURGERY Left     Removal    REMOVAL, FOREIGN BODY N/A 05/27/2019    Procedure: REMOVAL, PERITONEAL DIALYSIS CATHETER;  Surgeon: Herbert Moors., MD;  Location: [facility];  Service: General;  Laterality: N/A;    TUNNELED CATH CHECK/CHANGE (PERMCATH) N/A 07/03/2019    Procedure: TUNNELED CATH CHECK/CHANGE;  Surgeon:  Maureen Ralphs, MD;  Location: [facility];  Service: Interventional Radiology;  Laterality: N/A;    TUNNELED CATH PLACEMENT (PERMCATH) Bilateral 05/23/2019    Procedure: TUNNELED CATH PLACEMENT;  Surgeon: Jacques Earthly, MD;  Location: [facility];  Service: Interventional Radiology;  Laterality: Bilateral;       X-Rays/Tests/Labs:  Lab Results   Component Value Date/Time    HGB 8.1 (L) 07/26/2019 03:21 AM    HCT 27.2 (L) 07/26/2019 03:21 AM    K 4.7 07/26/2019 09:19 AM    NA 138 07/26/2019 09:19 AM    INR 1.1 07/01/2019 10:41 PM    TROPI <0.01 07/26/2019 01:53 PM    TROPI <0.01 07/26/2019 10:45 AM    TROPI 0.01 07/21/2019 10:58 AM    TROPI <0.01 07/21/2019 08:58 AM    TROPI 0.01 03/18/2006 06:10 PM       All imaging reviewed, please see chart for details.    Social History:  Prior Level of Function  Prior level of function: Ambulates with  assistive device, Needs assistance with ADLs  Assistive Device: Front wheel walker  Baseline Activity Level: Household ambulation  DME Currently at Home: Environmental consultant, [place]  Living Arrangements: Alone  Type of Home: [place]: Media planner  DME Currently at Home: Environmental consultant, [place] - Notes / Comments: Pt reports she just moved here but hasn't spent time in her apt b/c she was admitted to rehab after prev. hosp.  Plans to return to finish therapy      Subjective:  Patient is agreeable to participation in the therapy session.     Pain Assessment  Pain Assessment: No/denies pain      Objective:  Observation of Patient/Vital Signs:  Sats stable 99% on O2, 97% on RA         Cognitive Status and Neuro Exam:  Cognition/Neuro Status  Arousal/Alertness: Appropriate responses to stimuli  Attention Span: Appears intact  Behavior: calm;cooperative    Musculoskeletal Examination  Gross ROM  Right Lower Extremity ROM: within functional limits  Left Lower Extremity ROM: within functional limits  Gross Strength  Right Lower Extremity Strength: 3/5  Left Lower Extremity Strength: 3/5       Functional Mobility:  Functional Mobility  Supine to Sit: Stand by Assist;Increased Time;Increased Effort;using bedrail  Sit to Supine: Minimal Assist (LEs to bed level)  Sit to Stand: Unable to assess (Comment) (Pt reports significantly dizzy)    Participation and Activity Tolerance  Participation and Endurance  Participation Effort: good  Endurance: Tolerates < 10 min exercise, no significant change in vital signs    Educated the patient to role of physical therapy, plan of care, goals of therapy and safety with mobility and ADLs; patient verbalized understanding.    Patient left in bed with alarm and all other medical equipment in place and call bell and all personal items/needs within reach.  RN notified of session outcome.      Assessment:  [name] is a 64 y.o. female admitted  07/20/2019. Pt would benefit from Physical Therapy to address deficits and increase functional independence.  Pt w/ limited activity tolerance requiring increased time to complete functional mobility.  Pt requires skilled PT to for intervention to address endurance and progress transfer and gait training as appropriate.     PT Assessment  Assessment: Decreased endurance/activity tolerance;Decreased functional mobility      Treatment:  Increased time required post transfer 2/2 patient dizziness.  Required skilled intervention for monitoring and assess pt's activity tolerance and vital signs.   Pt sat EOB x 10 min.  Demos weight shifting and repositioning w/ SBA.  1 minor LOB, tactile facilitation to correct.        Plan:  Treatment/Interventions: Personnel officer, Functional transfer training, Patient/family training  PT Frequency: 2-3x/wk  Risks/Benefits/POC Discussed with Pt/Family: With patient    PMP Activity: Step 4 - Dangle at Bedside         Goals:  Goals  Goal Formulation: With patient  Time for Goal Acheivement: By time of discharge  Goals: Select goal  Pt Will Go Supine To Sit: independent  Pt Will Perform Sit To Supine: modified independent (w/ leg lifter if needed)  Pt Will Perform Sit to Stand: with supervision, to maximize functional mobility and independence  Pt Will Transfer Bed/Chair: with rolling walker, with supervision, to maximize functional mobility and independence  Pt Will Perform Home Exer Program: independent, to maximize functional mobility and independence         DME Recommended for Discharge: No additional equipment/DME recommended at this time  Discharge Recommendation: SNF    Bobetta Lime PT DPT  07/28/2019 2:24 PM

## 2019-07-28 NOTE — Progress Notes (Signed)
Patient is stable for discharge to skilled nursing facility  Case management to pursue discharge to SNF  We will dictate discharge summary in anticipation of discharge

## 2019-07-29 LAB — GLUCOSE WHOLE BLOOD - POCT
Whole Blood Glucose POCT: 152 mg/dL — ABNORMAL HIGH (ref 70–100)
Whole Blood Glucose POCT: 206 mg/dL — ABNORMAL HIGH (ref 70–100)
Whole Blood Glucose POCT: 144 mg/dL — ABNORMAL HIGH (ref 70–100)
Whole Blood Glucose POCT: 200 mg/dL — ABNORMAL HIGH (ref 70–100)

## 2019-07-29 MED ORDER — LORAZEPAM 2 MG/ML IJ SOLN
1.00 mg | INTRAMUSCULAR | Status: DC | PRN
Start: 2019-07-29 — End: 2019-07-31

## 2019-07-29 NOTE — Nursing Progress Note (Signed)
RRT called for syncopal episode. Clin tech reported pt became unresponsive while assisting pt back to bed. Tech reported pt c/o dizziness prior to syncopal episode. Pt confused upon awakening. Alert to self, repeatedly asking staff "take me to my room. I'm in the hallway". Stated date/year was July 4th, 2020, she was in a social services building, asking for a woman named Sharyn Lull that she stated works in the building. Pt also calling out RN and clin tech's names. Reoriented pt prn. Denied pain or SOB. Seizure precautions placed. Reassessed orientation after 5-77min. Pt stated year ws 2021, knew she was in her room at [facility] "on [address]". Answering all questions appropriately. Seen by attending, neurology and cardiology. New orders received. Orthostatic BP done. Lying - 138/70, HR 71; sitting - 151/85, HR 72; standing 115/71, HR 88. Pt reported dizziness when sitting that eventually subsided and dizziness when standing that did not subside until after lying down. EKG done. Cardiology notified of EKG results. Remains on remote tele. Pt resting comfortably in bed at this time. Call light within reach.

## 2019-07-29 NOTE — Progress Notes (Signed)
[facility]  PROGRESS NOTE      Patient: [name]  Date: 07/29/2019   LOS: 8 Days  Admission Date: 07/20/2019   MRN: [id number]  Attending: Forest Becker  Please contact me on the following Spectralink [id number]       ASSESSMENT/PLAN     [name] is a 64 y.o. female admitted with Shortness of breath    Interval Summary:     Active Hospital Problems    Diagnosis    Shortness of breath     Syncope with brief confusion lasting less than 5 minutes, likely orthostatic  ESRD on HD MWF  Hypotension, chronic  Pericardial effusion, small/moderate without evidence of tamponade  Acute metabolic encephalopathy earlier in admission  Right abdominal wall abscess/cellulitis 2/2 MRSA  Metabolic acidosis secondary to ESRD  Acute superficial venous thromboses s/p dialysis catheter  Recurrent mechanical falls  Anemia of CKD      Reengage cardiology  Reviewed telemetry: No acute events, NSR  Discussed with neurology-to see patient  Seizure precautions  Ativan as needed seizure-like activity  Continue midodrine  Orthostatics  Telemetry  Hold on discharge        Analgesia: APAP    Nutrition: Consistent carbs    DVT Prophylaxis:SCD       Code Status: DNR    DISPO: Monitor for 24 hrs- discharge if no further syncopal events    Family Contact:        [place] states she feels fine now, no chest pain, no shortness of breath.  Able to state her name, states that she is in a [employer] building, states the year is 2020, states July 4 as the day, states [name], and states that president is [place].  2 to 3 minutes later patient abruptly stating 2021 and that she is in the hospital.    Reports that she was lightheaded with standing and going to the bathroom, persisted to sitting down at the bed then had episode of passing out.  Confirmed by tech.  Was laid flat- was not answering appropriately to questioning initially.  Confusion lasted less than 5 minutes.  Currently  AXOx4    MEDICATIONS     Current Facility-Administered Medications   Medication Dose Route Frequency    atorvastatin  40 mg Oral Daily    collagenase   Topical Q24H SCH    darbepoetin alfa  60 mcg Subcutaneous Weekly    doxycycline  100 mg Oral Q12H SCH    insulin lispro  1-6 Units Subcutaneous QHS    insulin lispro  2-10 Units Subcutaneous TID AC    lactobacillus/streptococcus  1 capsule Oral Daily    lidocaine  1 patch Transdermal Q24H SCH    midodrine  10 mg Oral TID MEALS    pantoprazole  40 mg Oral QAM AC    senna  8.6 mg Oral QHS    sevelamer  800 mg Oral TID MEALS    sodium chloride  250 mL Intravenous Once    sodium hypochlorite   Irrigation Q12H       PHYSICAL EXAM     Vitals:    07/29/19 1158   BP: 152/64   Pulse: 70   Resp: 16   Temp: 98.1 F (36.7 C)   SpO2: 100%       Temperature: Temp  Min: 97.5 F (36.4 C)  Max: 100.4 F (38 C)  Pulse: Pulse  Min: 58  Max:  72  Respiratory: Resp  Min: 16  Max: 18  Non-Invasive BP: BP  Min: 103/53  Max: 171/80  Pulse Oximetry SpO2  Min: 98 %  Max: 100 %    Intake and Output Summary (Last 24 hours) at Date Time    Intake/Output Summary (Last 24 hours) at 07/29/2019 1213  Last data filed at 07/29/2019 0700  Gross per 24 hour   Intake 410 ml   Output 2500 ml   Net -2090 ml         GEN APPEARANCE: Normal;  A&OX3  HEENT: PERLA; EOMI; Conjunctiva Clear  NECK: Supple; No bruits  CVS: RRR, S1, S2; No M/G/R  LUNGS: CTAB; No Wheezes; No Rhonchi: No rales  ABD: Soft; No TTP; + Normoactive BS  EXT: No edema; Pulses 2+ and intact  Skin exam:  pink  NEURO: CN 2-12 intact; No Focal neurological deficits  CAP REFILL:  Normal  MENTAL STATUS:  Normal    Exam done by Forest Becker, MD on 07/29/19 at 12:13 PM      LABS     Recent Labs   Lab 07/26/19  0321 07/25/19  0358 07/23/19  0444   WBC 9.53* 9.10 7.88   RBC 2.93* 2.92* 2.71*   Hgb 8.1* 8.0* 7.7*   Hematocrit 27.2* 27.3* 24.8*   MCV 92.8 93.5 91.5   Platelets 240 211 179       Recent Labs   Lab 07/26/19  0919  07/23/19  0444   Sodium 138 137   Potassium 4.7 4.3   Chloride 100 100   CO2 23 22   BUN 68.0* 57.0*   Creatinine 7.8* 6.1*   Glucose 235* 164*   Calcium 8.6 8.9   Magnesium  --  2.2       Recent Labs   Lab 07/23/19  0444   Albumin 3.3*       Recent Labs   Lab 07/26/19  1353 07/26/19  1045   Troponin I <0.01 <0.01             Microbiology Results     Procedure Component Value Units Date/Time    Blood Culture Aerobic/Anaerobic #1 [[id number]] Collected: 07/20/19 2052    Specimen: Arm from Blood, Venipuncture Updated: 07/26/19 0421    Narrative:      ORDER#: [id number]                                    ORDERED BY: Marcello Moores, SASHA  SOURCE: Blood, Venipuncture arm                      COLLECTED:  07/20/19 20:52  ANTIBIOTICS AT COLL.:                                RECEIVED :  07/20/19 21:25  Culture Blood Aerobic and Anaerobic        FINAL       07/26/19 04:21  07/26/19   No growth after 5 days of incubation.      Blood Culture Aerobic/Anaerobic #2 [[id number]] Collected: 07/20/19 2052    Specimen: Arm from Blood, Venipuncture Updated: 07/26/19 0421    Narrative:      ORDER#: [id number]  ORDERED BY: THOMAS, SASHA  SOURCE: Blood, Venipuncture arm                      COLLECTED:  07/20/19 20:52  ANTIBIOTICS AT COLL.:                                RECEIVED :  07/20/19 21:25  Culture Blood Aerobic and Anaerobic        FINAL       07/26/19 04:21  07/26/19   No growth after 5 days of incubation.      COVID-19 (SARS-COV-2) [facility]) [[id number]] Collected: 07/21/19 0152    Specimen: Nasopharyngeal Swab from Nasopharynx Updated: 07/21/19 0324     Purpose of COVID testing Screening     SARS-CoV-2 Specimen Source Nasopharyngeal     SARS CoV 2 Overall Result Negative     Comment: Test performed using the Abbott ID NOW EUA assay.  Please see Fact Sheets for patients and providers located at:  http://[facility]-hall.info/    This test is for the qualitative detection of  SARS-CoV-2  (COVID19) nucleic acid. Viral nucleic acids may persist in vivo,  independent of viability. Detection of viral nucleic acid does  not imply the presence of infectious virus, or that virus  nucleic acid is the cause of clinical symptoms. Negative  results should be treated as presumptive and, if inconsistent  with clinical signs and symptoms or necessary for patient  management, should be tested with an alternative molecular  assay. Negative results do not preclude SARS-CoV-2 infection  and should not be used as the sole basis for patient  management decisions. Invalid results may be due to inhibiting  substances in the specimen and recollection should occur.         Narrative:      o Collect and clearly label specimen type:  o Upper respiratory specimen: One Nasopharyngeal Dry Swab NO  Transport Media.  o Hand deliver to laboratory ASAP  Indication for testing->Extended care facility admission to  semi private room  Screening    Culture + Gram Stain,Aerobic, Wound [[id number]] Collected: 07/22/19 1033    Specimen: Wound Updated: 07/24/19 0707    Narrative:      culturette  ORDER#: [id number]                                    ORDERED BY: Lahoma Crocker, ERIC  SOURCE: Wound abdominal wall                         COLLECTED:  07/22/19 10:33  ANTIBIOTICS AT COLL.:                                RECEIVED :  07/22/19 13:37  ORDER ENTRY COMMENTS:  culturette  Stain, Gram                                FINAL       07/22/19 15:20  07/22/19   Few WBCs             No Squamous epithelial cells seen             No organisms seen  Culture and Gram  Stain, Aerobic, Wound     FINAL       07/24/19 07:07   +  07/24/19   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            Previous quantitation was Light growth of, verified by [id number] at 12:43 on 07/23/19  _____________________________________________________________________________                                      MRSA        ANTIBIOTICS                     MIC  INTRP       _____________________________________________________________________________  Clindamycin                    <=0.5   S        Daptomycin                      <=1    S        Erythromycin                    >4     R        Gentamicin                      <=2    S        Levofloxacin                     4     I        Linezolid                        2     S        Oxacillin                       >2     R  D1    Penicillin                      >1     R        Rifampin                       <=0.5   S        Tetracycline                   <=0.5   S        Trimethoprim/Sulfamethoxazole <=0.5/9  S        Vancomycin                       1     S          -----DRUG COMMENTS----------    D1:  Oxacillin sensitivity predicts sensitivity to cephalosporins.         [place] Ant[employer]icrobial [employer]committee 2016  _____________________________________________________________________________            S=SUSCEPTIBLE     I=INTERMEDIATE     R=RESISTANT                            N/S=NON-SUSCEPTIBLE  _____________________________________________________________________________  MRSA culture - Nares [[id number]] Collected: 07/21/19 1058    Specimen: Culturette from Nares Updated: 07/22/19 1117     Culture MRSA Surveillance Negative for Methicillin Resistant Staph aureus    MRSA culture - Throat [[id number]] Collected: 07/21/19 1058    Specimen: Culturette from Throat Updated: 07/22/19 1117     Culture MRSA Surveillance Negative for Methicillin Resistant Staph aureus           XR Forearm Complete Right    Result Date: 07/26/2019   1.  No fracture or dislocation. 2.  Nonspecific soft tissue swelling about the wrist. 3.  Calcification in the region of the TFCC may be seen in the setting of pyrophosphate arthropathy (CPPD). Clinical correlation is recommended. Cynda Familia, MD  07/26/2019 7:33 AM    XR Wrist Right PA And Lateral    Result Date: 07/26/2019   1.  No fracture or dislocation. 2.  Nonspecific soft tissue swelling about the wrist. 3.   Calcification in the region of the TFCC may be seen in the setting of pyrophosphate arthropathy (CPPD). Clinical correlation is recommended. Cynda Familia, MD  07/26/2019 7:33 AM        Echo Results     Procedure Component Value Units Date/Time    Echocardiogram [place] [[id number]] Collected: 07/25/19 8022     Updated: 07/25/19 1119     Visually Estimated Ejection Fraction 64    Narrative:      Name:     [name]  Age:     63 years  DOB:     January 15, 1956  Gender:     Female  MRN:     [id number]  Wt:     240 lb  BSA:     9.75 m2  Systolic BP:     300 mmHg  Diastolic BP:     74 mmHg  Technical Quality:     Fair  Exam Date/Time:     07/25/2019 9:59 AM  Exam Type:     ECHOCARDIOGRAM ADULT LIMITED W CLR \\T \ DOPP WAVFRM LTD    Staff  Sonographer:     Hassel Neth, RDCS  Ordering Physician:     Junious Silk    Study Info  Indications      I31.9 - Disease of pericardium,  unspecified  Procedure    Limited two-dimensional transthoracic echocardiogram is performed.    62 in      Summary    * limited study for reevaluation pericardial effusion.    * Small to moderate pericardial effusion without evidence of tamponade(no rv  collapse, no ivc plethora, there is respiratory variation of mitral valve  inflow).    * Pleural effusion present.    * Normal left ventricular function ef 65%.      Inferior Vena Cava    The inferior vena cava is normal.    Greater than 50% collapse in the diameter of the inferior vena cava with  inspiration and no dilatation consistent with normal right atrial pressure.      Measurements  2D Measurements  ----------------------------------------------------------------------  Name                                 Value        Normal  ----------------------------------------------------------------------    LV Ejection Fraction 2D  ----------------------------------------------------------------------  Visually Estimated EF  64 %         54-74  Aorta / Venous  Measurements  ----------------------------------------------------------------------  Name                                 Value        Normal  ----------------------------------------------------------------------    IVC/SVC  ----------------------------------------------------------------------  IVC Diameter (Exp 2D)              2.12 cm        <=2.10      Report Signatures  Finalized by Amelia Jo  MD on 07/25/2019 11:19 AM  Promoted by Hassel Neth on 07/25/2019 11:04 AM    Echocardiogram Adult Comp W Clr/Dop/Bubble [[id number]] Collected: 07/21/19 1103     Updated: 07/21/19 1154     AV Mean Gradient 4.795     AV Mean Gradient 4.3     AV Mean Gradient 4.323     LVID diastole (2D) 3.495     LVID systole (2D) 8.159     IVS Diastolic Thickness (2D) 4.707     Ao Root Diameter (2D) 3.288     LA Dimension (2D) 3.657     AV Peak Velocity 145.603     AV Peak Velocity 139.739     AV Peak Velocity 138.762     TAPSE 1.609     MV E/A 1.67     MV E/A 1.455     MV E/A 1.552     MV E/A 1.611     MV E/A 1.611     MV Area (PHT) 3.16     MV E/e' (Average) 31.908     Visually Estimated Ejection Fraction 70     MV Regurgitation Severity mild     TV Regurgitation Severity trace to mild     AV Regurgitation Severity no     PV Regurgitation Severity no     Aortic Valve Findings There isno aortic regurgitation.     Aortic Valve Findings There isno aortic stenosis.     Aortic Valve Findings The aortic valve is tricuspid.     Pulmonary Valve Findings There isno pulmonic regurgitation.     Pulmonary Valve Findings The pulmonic valve is structurally normal     Mitral Valve Findings There is respiratory variation of the mitral valve inflow     Mitral Valve Findings There is mild mitral regurgitation     Mitral Valve Findings The mitral valve is structurally normal.     Tricuspid Valve Findings No pulmonary hypertension withestimated right ventricular systolic pressureof 9 mm hg     Tricuspid Valve Findings There is trace to  mildtricuspidregurgitation     Tricuspid Valve Findings The tricuspid valve is structurally normal.    Narrative:      Name:     [name]  Age:     25 years  DOB:     [birth year]  Gender:     Female  MRN:     [id number]  Systolic BP:     87 mmHg  Diastolic BP:     71 mmHg  Technical Quality:     Adequate  Exam Date/Time:     07/21/2019 11:03 AM  Exam Location:     [facility]  Exam Type:     ECHOCARDIOGRAM ADULT COMP W CLR/ [place] STDY    Staff  Sonographer:     Curly Shores, RCS  Ordering Physician:     Maricela Bo    Study Info  Indications       - rule out cardiac tamponade  Procedure    Complete two-dimensional, color flow and spectral Doppler transthoracic  echocardiogram is performed.      Summary    * Left ventricular ejection fraction is normal with an estimated ejection  fraction of 65-70%.    * There is mild mitral regurgitation.    * Small to moderate cirumferential pericardial effusion visualized.    * No  right atrial or right ventricular collapse, ivc with normal size and  respiratory variation.  There is increased respiratory variation of the mitral  valve inflow, but no frank tampoade or hemodynamic compromise.      Findings  Left Ventricle    The left ventricle is small.    Left ventricular wall thickness is mildly increased.    Left ventricular ejection fraction is normal with an estimated ejection  fraction of 65-70%.    Left ventricular segmental wall motion is Empty.    Left ventricular diastolic filling parameters Empty.  Right Ventricle    The right ventricular cavity size is Empty.    Normal right ventricular systolic function.      Left Atrium    The left atrium is Empty.    Right Atrium    The right atrium is Empty.    Atrial Septum    No evidence of interatrial shunt by color Doppler.      Aortic Valve    The aortic valve is tricuspid.    There is no aortic stenosis.    There is no aortic regurgitation.    Pulmonary Valve    The pulmonic valve is structurally  normal.    There is no pulmonic regurgitation.    Mitral Valve    The mitral valve is structurally normal.    There is mild mitral regurgitation.    There is respiratory variation of the mitral valve inflow.    Tricuspid Valve    The tricuspid valve is structurally normal.    There is trace to mild tricuspid regurgitation.    No pulmonary hypertension with estimated right ventricular systolic pressure  of 9 mm hg.      Pericardium / Pleural Effusion    Small to moderate cirumferential pericardial effusion visualized.    No right atrial or right ventricular collapse, ivc with normal size and  respiratory variation. There is increased respiratory variation of the mitral  valve inflow, but no frank tampoade or hemodynamic compromise.    Inferior Vena Cava    The IVC is normal in size with > 50% respiratory variance consistent with  normal RA pressure of 3 mmHg.    Aorta    The aortic root is normal in size.    The ascending aorta is normal in size.      Measurements  2D Measurements  ----------------------------------------------------------------------  Name                                 Value        Normal  ----------------------------------------------------------------------    Parasternal 2D  ----------------------------------------------------------------------  IVS Diastolic Thickness (2D)       1.17 cm     0.60-0.90   LVID Diastole (2D)                 3.50 cm  3.06-2.37   LVIW Diastolic Thickness  (2D)                               1.08 cm     0.60-0.95   LVID Systole (2D)                  2.15 cm     2.20-3.50   LA Dimension (2D)                  3.66 cm     2.70-3.80   Ao Root Diameter (2D)              3.29 cm     2.70-3.70     LV Ejection Fraction 2D  ----------------------------------------------------------------------  Visually Estimated EF                 70 %         54-74  M-mode Measurements  ----------------------------------------------------------------------  Name                                  Value        Normal  ----------------------------------------------------------------------    M-Mode  ----------------------------------------------------------------------  TAPSE                              1.61 cm        >=1.60  LVOT/Aortic Valve Doppler Measurements  ----------------------------------------------------------------------  Name                                 Value        Normal  ----------------------------------------------------------------------    LVOT Doppler  ----------------------------------------------------------------------  LVOT Peak Velocity                1.09 m/s                   AoV Doppler  ----------------------------------------------------------------------  AV Peak Velocity                  1.46 m/s                 AV Peak Gradient                    8 mmHg                 AV Mean Gradient                    5 mmHg           <20   AV V1/V2 Ratio                        0.75  Mitral Valve Measurements  ----------------------------------------------------------------------  Name                                 Value        Normal  ----------------------------------------------------------------------    MV Doppler  ----------------------------------------------------------------------  MV E Peak Velocity                0.80 m/s  MV A Peak Velocity                0.50 m/s                 MV E/A                                1.61                   MV Annular TDI  ----------------------------------------------------------------------  MV E/e' (Septal)                     31.00        <=8.00   MV E/e' (Lateral)                    32.81        <=8.00  Tricuspid Valve Measurements  ----------------------------------------------------------------------  Name                                 Value        Normal  ----------------------------------------------------------------------    TV Regurgitation  Doppler  ----------------------------------------------------------------------  TR Peak Velocity                  1.49 m/s                 TR Peak Gradient                    9 mmHg  Aorta / Venous Measurements  ----------------------------------------------------------------------  Name                                 Value        Normal  ----------------------------------------------------------------------    IVC/SVC  ----------------------------------------------------------------------  IVC Diameter (Exp 2D)              2.25 cm        <=2.10      Report Signatures  Finalized by Amelia Jo  MD on 07/21/2019 11:53 AM  Promoted by Curly Shores on 07/21/2019 11:39 AM          RADIOLOGY     Upon my review:    Signed,  Forest Becker  12:13 PM 07/29/2019

## 2019-07-29 NOTE — Progress Notes (Signed)
[facility] denied, updated ADLs and resubmitted for processing    FORM ID: [id number]    Royann Shivers, RN  Manager, Case Management  [phone]

## 2019-07-29 NOTE — Plan of Care (Signed)
Pt A&Ox4. Pt denied chest pain. Pt reported shortness of breath, on NC 1L, O2 Sat 98%. Permacath dressing was dry, clean, intact. RN attempted to do wound care for abdomen last night, pt told RN to come back later, then RN came back, pt stated pt wants to sleep & will do it in the morning, RN asked pt again in the morning but pt refused. Pt has dialysis on MWF. Plan to discharge to SNF. Call light & fall bundle are in place. Pt was instructed to call for an assistance when needed.  Problem: Safety  Goal: Patient will be free from injury during hospitalization  Outcome: Progressing  Flowsheets (Taken 07/29/2019 0419)  Patient will be free from injury during hospitalization:   Assess patient's risk for falls and implement fall prevention plan of care per policy   Use appropriate transfer methods   Ensure appropriate safety devices are available at the bedside   Provide and maintain safe environment   Include patient/ family/ care giver in decisions related to safety   Hourly rounding     Problem: Renal Instability  Goal: Fluid and electrolyte balance are achieved/maintained  Outcome: Progressing  Flowsheets (Taken 07/29/2019 0419)  Fluid and electrolyte balance are achieved/maintained:   Monitor intake and output every shift   Monitor/assess lab values and report abnormal values   Provide adequate hydration   Assess and reassess fluid and electrolyte status   Monitor for muscle weakness   Assess for confusion/personality changes

## 2019-07-29 NOTE — Progress Notes (Signed)
[facility] PROGRESS NOTE  Levi Aland [id number] ([place])      Date Time: 07/29/19 10:22 AM  Patient Name: [name]  Attending Physician: Forest Becker, MD    CC: follow-up ESRD, provision of hemodialysis    Assessment:     1. ESRD on HD MWF at [facility], Dr. Aline August  2. Hyperkalemia - resolved  3. Hypotension - on midodrine  - neg stim test  4. Anemia in CKD  5. Mild metabolic acidosis - resolved    Recommendations:   1. S/p HD yesterday with 2L net ultrafiltration  2. Next HD tomorrow, then MWF  3. Stable from renal standpoint - will follow at  [place] discussed with: patient, RN    Verl Blalock, MD  [facility]  [phone] (office)  X [phone] [facility])      Subjective: doing well, no new complaints this morning    Review of Systems:   Cardio: chest pain, no edema  Pulm: no SOB  GI: no abdominal pain  MSK: right hand pain - improving    Physical Exam:     Vitals:    07/29/19 0022 07/29/19 0110 07/29/19 0540 07/29/19 0745   BP: 129/79  133/76 128/71   Pulse: 63  66 64   Resp:   18 18   Temp: 100.4 F (38 C) 99.2 F (37.3 C) 98.4 F (36.9 C) 98.2 F (36.8 C)   TempSrc: Axillary Oral Oral Oral   SpO2: 98%  98% 98%   Weight:       Height:           Intake and Output Summary (Last 24 hours) at Date Time    Intake/Output Summary (Last 24 hours) at 07/29/2019 1022  Last data filed at 07/29/2019 0700  Gross per 24 hour   Intake 410 ml   Output 2500 ml   Net -2090 ml       General: awake, alert, oriented x 3, no acute distress.  Cardiovascular: regular rate and rhythm  Lungs: clear to auscultation bilaterally, bilateral air entry, normal work of breathing  Abdomen: soft, NT, ND  Extremities: no edema    Access:  R IJ HDTC    Meds:      Scheduled Meds: PRN Meds:    atorvastatin, 40 mg, Oral, Daily  collagenase, , Topical, Q24H SCH  darbepoetin alfa, 60 mcg, Subcutaneous, Weekly  doxycycline, 100 mg, Oral, Q12H SCH  insulin lispro, 1-6 Units,  Subcutaneous, QHS  insulin lispro, 2-10 Units, Subcutaneous, TID AC  lactobacillus/streptococcus, 1 capsule, Oral, Daily  lidocaine, 1 patch, Transdermal, Q24H SCH  midodrine, 10 mg, Oral, TID MEALS  pantoprazole, 40 mg, Oral, QAM AC  senna, 8.6 mg, Oral, QHS  sevelamer, 800 mg, Oral, TID MEALS  sodium chloride, 250 mL, Intravenous, Once  sodium hypochlorite, , Irrigation, Q12H          Continuous Infusions:   sodium chloride, , PRN  sodium chloride, , PRN  acetaminophen, 650 mg, Q6H PRN   Or  acetaminophen, 650 mg, Q6H PRN  bisacodyl, 5 mg, Daily PRN  dextrose, 15 g of glucose, PRN   And  dextrose, 12.5 g, PRN   And  glucagon (rDNA), 1 mg, PRN  lactulose, 20 g, BID PRN  melatonin, 3 mg, QHS PRN  naloxone, 0.2 mg, PRN  ondansetron, 4 mg, Q6H PRN   Or  ondansetron, 4 mg, Q6H PRN  oxyCODONE, 10 mg, Q3H  PRN              Labs:     Recent Labs   Lab 07/26/19  0321 07/25/19  0358 07/23/19  0444   WBC 9.53* 9.10 7.88   Hgb 8.1* 8.0* 7.7*   Hematocrit 27.2* 27.3* 24.8*   Platelets 240 211 179     Recent Labs   Lab 07/26/19  0919 07/23/19  0444   Sodium 138 137   Potassium 4.7 4.3   Chloride 100 100   CO2 23 22   BUN 68.0* 57.0*   Creatinine 7.8* 6.1*   Calcium 8.6 8.9   Albumin  --  3.3*   Phosphorus  --  6.1*   Magnesium  --  2.2   Glucose 235* 164*   EGFR 6.3 8.4     : No results found.        Signed by: Verl Blalock, MD, FASN

## 2019-07-29 NOTE — Plan of Care (Signed)
Mental status has returned to baseline. No further syncopal episodes noted. Rested in bed this PM. Denied pain. Moderate assist with ADL's. Permacath right chest intact. Dressing C/D/I. Wound care done to abd. Tolerated well. Call light within reach. Fall bundle in place. VSS. Discharge orders discontinued due to syncopal episode.   Problem: Moderate/High Fall Risk Score >5  Goal: Patient will remain free of falls  Outcome: Progressing     Problem: Safety  Goal: Patient will be free from injury during hospitalization  Outcome: Progressing     Problem: Pain  Goal: Pain at adequate level as identified by patient  Outcome: Progressing     Problem: Discharge Barriers  Goal: Patient will be discharged home or other facility with appropriate resources  Outcome: Progressing     Problem: Compromised Tissue integrity  Goal: Damaged tissue is healing and protected  Outcome: Progressing

## 2019-07-29 NOTE — Progress Notes (Addendum)
[facility]     Date Time: 07/29/19 12:20 PM  Patient Name: [name], [name]  Date of Birth: 1955-03-26  CSN: [id number]  MRN: [id number]         Subjective:   RRT called this morning-asked to reevaluate from a cardiac standpoint.  She apparently became somewhat lightheaded and dizzy while ambulating and then had a brief syncopal episode and period of confusion.  Currently back to baseline.  No arrhythmias at the time.  6 documented blood pressures reviewed from July 6 between 12:22 AM and 67:59 PM.  Systolic blood pressure ranging 163-846 with diastolic blood pressures 65-99.  She reports that she did notice the onset of dizziness after returning to her bed from the bathroom.  She reports she remembers sitting on the end of the bed but since then did not recall events.  She is currently lying flat in bed and does feel that she is mildly dizzy.  She continues to have chest discomfort if she takes a deep inhalation.  This is unchanged during her hospitalization    Hospital Medications:      Scheduled Meds: PRN Meds:    atorvastatin, 40 mg, Oral, Daily  collagenase, , Topical, Q24H SCH  darbepoetin alfa, 60 mcg, Subcutaneous, Weekly  doxycycline, 100 mg, Oral, Q12H SCH  insulin lispro, 1-6 Units, Subcutaneous, QHS  insulin lispro, 2-10 Units, Subcutaneous, TID AC  lactobacillus/streptococcus, 1 capsule, Oral, Daily  lidocaine, 1 patch, Transdermal, Q24H SCH  midodrine, 10 mg, Oral, TID MEALS  pantoprazole, 40 mg, Oral, QAM AC  senna, 8.6 mg, Oral, QHS  sevelamer, 800 mg, Oral, TID MEALS  sodium chloride, 250 mL, Intravenous, Once  sodium hypochlorite, , Irrigation, Q12H        Continuous Infusions:   sodium chloride, , PRN  sodium chloride, , PRN  acetaminophen, 650 mg, Q6H PRN   Or  acetaminophen, 650 mg, Q6H PRN  bisacodyl, 5 mg, Daily PRN  dextrose, 15 g of glucose, PRN   And  dextrose, 12.5 g, PRN   And  glucagon (rDNA), 1 mg, PRN  lactulose, 20 g, BID PRN  LORazepam, 1 mg, Q4H  PRN  melatonin, 3 mg, QHS PRN  naloxone, 0.2 mg, PRN  ondansetron, 4 mg, Q6H PRN   Or  ondansetron, 4 mg, Q6H PRN  oxyCODONE, 10 mg, Q3H PRN              Home Medications:     Medications Prior to Admission   Medication Sig Dispense Refill Last Dose    atorvastatin (LIPITOR) 40 MG tablet Take 40 mg by mouth daily       collagenase (SANTYL) ointment Apply topically every 24 hours 90 g 0     Dulaglutide (Trulicity) 3.57 SV/7.7LT Solution Pen-injector Inject 0.75 mg into the skin once a week       famotidine (PEPCID) 40 MG tablet Take 40 mg by mouth daily       insulin aspart (NovoLOG) 100 UNIT/ML injection Inject 5 Units into the skin 3 (three) times daily before meals (Patient taking differently: Inject 15 Units into the skin 3 (three) times daily before meals   )       lactobacillus/streptococcus (RISAQUAD) Cap Take 1 capsule by mouth daily 30 capsule 0     lidocaine (LIDODERM) 5 % Place 1 patch onto the skin every 24 hours Remove & Discard patch within 12 hours or as directed by MD 30 each 0     nystatin (  NYSTOP) powder Apply 1 application topically 2 (two) times daily 30 g 0     pantoprazole (PROTONIX) 40 MG tablet Take 1 tablet (40 mg total) by mouth 2 (two) times daily       sevelamer (RENVELA) 800 MG tablet Take 800 mg by mouth 3 (three) times daily with meals       vitamin C (ASCORBIC ACID) 500 MG tablet Take 1 tablet (500 mg total) by mouth daily 30 tablet 0     zinc Oxide (DESITIN) 40 % Paste paste Apply topically every 6 (six) hours       zinc sulfate (ZINCATE) 220 (50 Zn) MG capsule Take 1 capsule (220 mg total) by mouth daily 30 capsule 0     [DISCONTINUED] amLODIPine (NORVASC) 5 MG tablet Take 1 tablet (5 mg total) by mouth every 12 (twelve) hours       [DISCONTINUED] carvedilol (COREG) 25 MG tablet Take 1 tablet (25 mg total) by mouth every 12 (twelve) hours       [DISCONTINUED] gabapentin (NEURONTIN) 300 MG capsule Take 1 capsule (300 mg total) by mouth daily 30 capsule 0      [DISCONTINUED] insulin glargine (LANTUS) 100 UNIT/ML injection Inject 30 Units into the skin every 12 (twelve) hours (Patient taking differently: Inject 35 Units into the skin nightly   ) 1 pen 0     [DISCONTINUED] oxyCODONE (ROXICODONE) 5 MG immediate release tablet Take 1 tablet (5 mg total) by mouth every 6 (six) hours as needed for Pain 10 tablet 0     [DISCONTINUED] sodium hypochlorite (DAKIN'S QUARTER STRENGTH) 0.125 % Solution Irrigate with as directed daily 1 Bottle 0          Physical Exam:     VITAL SIGNS   Temp:  [97.5 F (36.4 C)-100.4 F (38 C)] 98.1 F (36.7 C)  Heart Rate:  [58-72] 70  Resp Rate:  [16-18] 16  BP: (103-171)/(53-80) 152/64  POCT Glucose Result (Read Only)  Avg: 188.5  Min: 85  Max: 319  SpO2: 100 %    Intake/Output Summary (Last 24 hours) at 07/29/2019 1220  Last data filed at 07/29/2019 0700  Gross per 24 hour   Intake 410 ml   Output 2500 ml   Net -2090 ml          GENERAL: Patient is in no acute distress   HEENT: No scleral icterus or conjunctival pallor, moist mucous membranes   NECK: No jugular venous distention  CARDIAC: Regular rate and rhythm, with normal S1 and S2  CHEST: Clear to auscultation bilaterally, normal respiratory effort  ABDOMEN: Nontender  EXTREMITIES: No edema  NEUROLOGIC: Alert and awake         Labs:     CBC w/Diff   Recent Labs   Lab 07/26/19  0321 07/25/19  0358 07/23/19  0444   WBC 9.53* 9.10 7.88   Hgb 8.1* 8.0* 7.7*   Hematocrit 27.2* 27.3* 24.8*   Platelets 240 211 179          Comprehensive Metabolic Profile   Recent Labs   Lab 07/26/19  0919 07/23/19  0444   Sodium 138 137   Potassium 4.7 4.3   Chloride 100 100   CO2 23 22   BUN 68.0* 57.0*   Creatinine 7.8* 6.1*   Calcium 8.6 8.9   Albumin  --  3.3*   Glucose 235* 164*   Magnesium  --  2.2          Cardiac  Enzymes   Recent Labs   Lab 07/26/19  1353 07/26/19  1045   Troponin I <0.01 <0.01       Lab Results   Component Value Date    BNP 277 (H) 07/20/2019          Thyroid Studies          Invalid  input(s): FREET4        Lipid Profile            Coagulation Studies       Lab Results   Component Value Date    DDIMER 2.60 (H) 05/17/2019            Telemetry:   Normal sinus rhythm    Rads:   XR Forearm Complete Right    Result Date: 07/26/2019  EXAM: RIGHT FOREARM AND WRIST RADIOGRAPHS CLINICAL HISTORY: 63 year old woman TECHNIQUE: 2 views of the right forearm and 2 views of the right wrist COMPARISON: Right wrist radiographs performed 06/30/2019  FINDINGS:  Bone mineralization is normal. Atherosclerotic calcification is present. Wrist: There is no fracture or dislocation. Joint spaces are maintained. No bony erosion is detected. There is calcification in the region of the triangular fibrocartilage complex (TFCC), which may be seen in the setting of pyrophosphate arthropathy (CPPD). There is soft tissue swelling about the wrist. Forearm: There is no fracture or dislocation. The elbow joint is visualized study.        1.  No fracture or dislocation. 2.  Nonspecific soft tissue swelling about the wrist. 3.  Calcification in the region of the TFCC may be seen in the setting of pyrophosphate arthropathy (CPPD). Clinical correlation is recommended. Cynda Familia, MD  07/26/2019 7:33 AM    XR Wrist Right PA And Lateral    Result Date: 07/26/2019  EXAM: RIGHT FOREARM AND WRIST RADIOGRAPHS CLINICAL HISTORY: 64 year old woman TECHNIQUE: 2 views of the right forearm and 2 views of the right wrist COMPARISON: Right wrist radiographs performed 06/30/2019  FINDINGS:  Bone mineralization is normal. Atherosclerotic calcification is present. Wrist: There is no fracture or dislocation. Joint spaces are maintained. No bony erosion is detected. There is calcification in the region of the triangular fibrocartilage complex (TFCC), which may be seen in the setting of pyrophosphate arthropathy (CPPD). There is soft tissue swelling about the wrist. Forearm: There is no fracture or dislocation. The elbow joint is visualized study.        1.   No fracture or dislocation. 2.  Nonspecific soft tissue swelling about the wrist. 3.  Calcification in the region of the TFCC may be seen in the setting of pyrophosphate arthropathy (CPPD). Clinical correlation is recommended. Cynda Familia, MD  07/26/2019 7:33 AM    Wrist Right PA Lateral And Oblique    Result Date: 06/30/2019  History: Fall.      4 views of the right wrist were compared to a similar study from 06/25/2019. No new fractures seen. No malalignment. Soft tissue swelling present. Atherosclerosis. Lorretta Harp, DO  06/30/2019 11:28 PM    CT Head WO Contrast    Result Date: 07/01/2019  CT HEAD WO CONTRAST HISTORY: Altered mental status. COMPARISON: 06/30/2019 TECHNIQUE: Multiple axial images were obtained from the base of the skull to the vertex without contrast. Sagittal and coronal reconstructions were performed. One of the following dose optimization techniques was utilized in the performance of this exam: Automated exposure control; adjustment of the mA and/or kV according to the patient's size; or use of an iterative  reconstruction technique. Specific details can be referenced in the facility's radiology CT exam operational policy. FINDINGS: The ventricles and basal cisterns are normal in size. There is no hemorrhage, mass, or midline shift. There is no evidence of acute infarct. The bones are normal.  The visualized paranasal sinuses are normal. There is left posterior parietal soft tissue swelling similar to the prior exam.     Stable left posterior parietal soft tissue swelling with no acute intracranial process. Delaney Meigs, MD  07/01/2019 11:48 PM    CT Head without Contrast    Result Date: 06/30/2019  HISTORY: head injury ams COMPARISON: 06/23/2019. TECHNIQUE: Helical CT of the head was performed without intravenous contrast. Multiplanar reconstructions were reviewed. A combination of automatic exposure control, adjustment of the mA and or KV according to patient size, and/or use of iterative  reconstruction technique was utilized. FINDINGS: There is no acute intracranial hemorrhage, mass effect or midline shift. Gray-white matter differentiation is preserved. No acute territorial stroke hypodensity is demonstrated. Mild involutional changes. The basal cisterns are patent. The globes are intact. Clear paranasal sinuses and mastoid air cells. The calvarium is intact. Posterior scalp hematoma noted.      No acute intracranial findings demonstrated. No acute skull fracture. Posterior scalp hematoma. Lorretta Harp, DO  06/30/2019 11:43 PM    CT Cervical Spine without Contrast    Result Date: 06/30/2019  Clinical History: Altered mental status. Examination: CT Cervical spine without contrast. CT images were acquired utilizing Automated Exposure Control for dose reduction. Comparison: None available. Findings: Limitations: Body habitus. The cervical alignment is intact.  No acute cervical spine fracture is identified.  The vertebral body heights are intact.  No suspicious osseous lesions are identified.  The craniocervical junction appears intact. There are mild degenerative disc changes and facet arthropathy. There is no prevertebral soft tissue swelling. There is a partially visualized right-sided central venous catheter.     No evidence of acute cervical spine fracture or malalignment. Gillermina Phy, MD  06/30/2019 11:56 PM    NM Lung Scan Perfusion Particulate    Result Date: 07/21/2019  CLINICAL HISTORY:   pleuritic cp, hypoxia, end-stage renal failure COMPARISON EXAM: Other. TECHNIQUE:  4.9 mCi Tc 28m MAA was injected intravenously and A SPECT scan of the chest was done to to demonstrate the lung perfusion. Additionally a SPECT-CT scan was done  with fusion of SPECT and CT images images on a separate work station. The low-dose spiral CT, was registered with the SPECT scan, and utilized for attenuation correction and anatomic localization. Images were displayed in coronal, sagittal and axial planes. Note, this  is a nonstandard exam for diagnosing pulmonary embolism. Ventilation scan was not performed due to COVID 19 pandemic. FINDINGS: PERFUSION FINDINGS: No evidence of segmental mismatch perfusion abnormality LUNG FINDINGS: CT portion of the study is of nondiagnostic quality, however the following findings are evident: Moderate pericardial effusion measuring up to 15 mm in maximal thickness. Small bilateral pleural effusions. Septal thickening and mild diffuse groundglass opacities throughout both lungs with central distribution. Moderate degenerative changes. Osteopenia. Total left shoulder replacement prosthesis.     NO EVIDENCE FOR PULMONARY EMBOLISM. Moderate pericardial effusion. Cannot rule out cardiac tamponade. Small bilateral pleural effusions. Mild pulmonary edema. Keith Rake, MD  07/21/2019 1:04 AM    XR Chest AP Portable    Result Date: 07/22/2019  XR CHEST AP PORTABLE CLINICAL INDICATION:   shortness of breath COMPARISON: 07/20/2019      There is a prominent appearance of  the cardiac silhouette. Mediastinal silhouette appears within normal size limits for portable technique and patient positioning. Central venous catheter does not appear significantly changed. Pulmonary vascular congestion does not appear significantly changed. There is mild persistent blunting of the left costophrenic angle. There is no evidence for pneumothorax. Edison Simon, MD  07/22/2019 7:58 AM    Chest AP Portable    Result Date: 07/20/2019  HISTORY: chest pain TECHNIQUE: Single AP view of the chest. COMPARISON: 07/01/2019. FINDINGS: Central pulmonary vascular engorgement present. Right-sided dual lumen hemodialysis catheter projects over the right atrium. There is a left retrocardiac opacity. Stable cardiac and mediastinal contours.      Mild central pulmonary vascular engorgement. Left retrocardiac opacity probably representing some combination of small left pleural effusion and subsegmental atelectasis however a coexisting left basilar  infiltrate cannot be excluded. Lonia Skinner, MD  07/20/2019 9:35 PM    XR Chest AP Portable    Result Date: 07/02/2019  HISTORY:? Infiltrate. COMPARISON: 03/18/2006 TECHNIQUE: XR CHEST AP PORTABLE FINDINGS: Lines and tubes: Right central venous catheter is present with the tip in the region of the SVC. Lungs and hila: The lungs are clear. There is no pleural effusion. There is no pneumothorax. Heart and Mediastinum: Normal Bones and soft tissues: No acute abnormality. Upper abdomen: Normal     No acute process. Delaney Meigs, MD  07/02/2019 12:36 AM    [place] VenoDopp Low Extremity Bilateral    Result Date: 07/21/2019  BILATERAL LOWER EXTREMITY VENOUS DUPLEX ULTRASOUND DATE OF SERVICE: 07/20/2019 21:30 PM. HISTORY: 64 years-old Female with generalized body ache x2 days. History focal right cephalic thrombosis/thrombophlebitis 06/24/2019. Concern for DVT. Venous duplex evaluation is requested. COMPARISON: Right upper extremity venous duplex ultrasound 06/24/2019. TECHNIQUE: Bilateral lower extremity venous duplex ultrasound was performed utilizing grayscale, color and spectral Doppler, transverse compression, and augmentation techniques. FINDINGS: The bilateral femoropopliteal and deep calf veins are patent with normal coaptation and no evidence of thrombosis. The bilateral saphenofemoral junctions are patent. Normal respiratory phasicity is present at the iliofemoral junctions bilaterally with no evidence to suggest central venous obstruction.     Negative for DVT or central venous obstruction of the bilateral lower extremities. Venu Vadlamudi  07/21/2019 1:03 AM    NM Spect CT Fusion Sgl Area Sgl Day    Result Date: 07/21/2019  CLINICAL HISTORY:   pleuritic cp, hypoxia, end-stage renal failure COMPARISON EXAM: Other. TECHNIQUE:  4.9 mCi Tc 89m MAA was injected intravenously and A SPECT scan of the chest was done to to demonstrate the lung perfusion. Additionally a SPECT-CT scan was done  with fusion of SPECT and CT images images on a  separate work station. The low-dose spiral CT, was registered with the SPECT scan, and utilized for attenuation correction and anatomic localization. Images were displayed in coronal, sagittal and axial planes. Note, this is a nonstandard exam for diagnosing pulmonary embolism. Ventilation scan was not performed due to COVID 19 pandemic. FINDINGS: PERFUSION FINDINGS: No evidence of segmental mismatch perfusion abnormality LUNG FINDINGS: CT portion of the study is of nondiagnostic quality, however the following findings are evident: Moderate pericardial effusion measuring up to 15 mm in maximal thickness. Small bilateral pleural effusions. Septal thickening and mild diffuse groundglass opacities throughout both lungs with central distribution. Moderate degenerative changes. Osteopenia. Total left shoulder replacement prosthesis.     NO EVIDENCE FOR PULMONARY EMBOLISM. Moderate pericardial effusion. Cannot rule out cardiac tamponade. Small bilateral pleural effusions. Mild pulmonary edema. Keith Rake, MD  07/21/2019 1:04 AM  Tunneled Cath Check/Change (Permcath)    Result Date: 07/07/2019  PREOPERATIVE DIAGNOSIS: Malfunctioning tunneled dialysis catheter POSTOPERATIVE DIAGNOSIS: Same OPERATOR: Berline Lopes, MD PROCEDURE: 1. Tunneled dialysis catheter check with contrast 2. Right internal jugular tunneled dialysis catheter exchange TECHNIQUE: Following informed consent, the patient was brought to interventional radiology and placed in a supine position. The right internal jugular tunneled dialysis catheter was prepped and draped in the usual sterile fashion. Contrast was injected through the tunnel dialysis catheter lumen and venography was performed. Local anesthesia was acquired with subcutaneous 1% lidocaine. The catheter was then exchanged over wire for a new 23 cm tip to cuff tunneled dialysis catheter. A spot fluoroscopic image was obtained. The catheter was secured to the skin with 2-0 Prolene suture. A  sterile bandage was applied. FINDINGS: Venography does not demonstrate a fibrin sheath. Satisfactory positioning of the new catheter within the right atrium. COMPLICATIONS: none ANESTHESIA: Local anesthesia with 1% lidocaine FLUORO DOSE:  636.3 uGym2      Successful right internal jugular tunneled dialysis catheter exchange Nelma Rothman, MD  07/03/2019 2:21 PM    Echocardiogram [place]    Result Date: 07/25/2019  Name:     [name] Age:     34 years DOB:     Aug 01, 1955 Gender:     Female MRN:     [id number] Wt:     240 lb BSA:     5.17 m2 Systolic BP:     001 mmHg Diastolic BP:     74 mmHg Technical Quality:     Fair Exam Date/Time:     07/25/2019 9:59 AM Exam Type:     ECHOCARDIOGRAM ADULT LIMITED W CLR \\T \ DOPP [facility] Staff Sonographer:     Hassel Neth, [place] Ordering Physician:     Junious Silk Study Info Indications     I31.9 - Disease of pericardium,  unspecified Procedure   Limited two-dimensional transthoracic echocardiogram is performed. 62 in Summary   * limited study for reevaluation pericardial effusion.   * Small to moderate pericardial effusion without evidence of tamponade(no rv collapse, no ivc plethora, there is respiratory variation of mitral valve inflow).   * Pleural effusion present.   * Normal left ventricular function ef 65%. Inferior Vena Cava   The inferior vena cava is normal.   Greater than 50% collapse in the diameter of the inferior vena cava with inspiration and no dilatation consistent with normal right atrial pressure. Measurements 2D Measurements ---------------------------------------------------------------------- Name                                 Value        Normal ---------------------------------------------------------------------- LV Ejection Fraction 2D ---------------------------------------------------------------------- Visually Estimated EF                 64 %         54-74 Aorta / Venous Measurements  ---------------------------------------------------------------------- Name                                 Value        Normal ---------------------------------------------------------------------- IVC/SVC ---------------------------------------------------------------------- IVC Diameter (Exp 2D)              2.12 cm        <=2.10 Report Signatures Finalized by Amelia Jo  MD on 07/25/2019 11:19  AM Promoted by Hassel Neth on 07/25/2019 11:04 AM    Echocardiogram Adult Comp W Clr/Dop/Bubble    Result Date: 07/21/2019  Name:     [name] Age:     88 years DOB:     1956/01/15 Gender:     Female MRN:     [id number] Systolic BP:     87 mmHg Diastolic BP:     71 mmHg Technical Quality:     Adequate Exam Date/Time:     07/21/2019 11:03 AM Exam Location:     [facility] Exam Type:     ECHOCARDIOGRAM ADULT COMP W CLR/ DOPP [facility] BUBBLE ST[facility] Staff Sonographer:     Curly [place], RCS Ordering Physician:     Maricela Bo Study Info Indications      - rule out cardiac tamponade Procedure   Complete two-dimensional, color flow and spectral Doppler transthoracic echocardiogram is performed. Summary   * Left ventricular ejection fraction is normal with an estimated ejection fraction of 65-70%.   * There is mild mitral regurgitation.   * Small to moderate cirumferential pericardial effusion visualized.   * No  right atrial or right ventricular collapse, ivc with normal size and respiratory variation.  There is increased respiratory variation of the mitral valve inflow, but no frank tampoade or hemodynamic compromise. Findings Left Ventricle   The left ventricle is small.   Left ventricular wall thickness is mildly increased.   Left ventricular ejection fraction is normal with an estimated ejection fraction of 65-70%.   Left ventricular segmental wall motion is Empty.   Left ventricular diastolic filling parameters Empty. Right Ventricle   The right ventricular cavity size is Empty.   Normal right ventricular systolic  function. Left Atrium   The left atrium is Empty. Right Atrium   The right atrium is Empty. Atrial Septum   No evidence of interatrial shunt by color Doppler. Aortic Valve   The aortic valve is tricuspid.   There is no aortic stenosis.   There is no aortic regurgitation. Pulmonary Valve   The pulmonic valve is structurally normal.   There is no pulmonic regurgitation. Mitral Valve   The mitral valve is structurally normal.   There is mild mitral regurgitation.   There is respiratory variation of the mitral valve inflow. Tricuspid Valve   The tricuspid valve is structurally normal.   There is trace to mild tricuspid regurgitation.   No pulmonary hypertension with estimated right ventricular systolic pressure of 9 mm hg. Pericardium / Pleural Effusion   Small to moderate cirumferential pericardial effusion visualized.   No right atrial or right ventricular collapse, ivc with normal size and respiratory variation. There is increased respiratory variation of the mitral valve inflow, but no frank tampoade or hemodynamic compromise. Inferior Vena Cava   The IVC is normal in size with > 50% respiratory variance consistent with normal RA pressure of 3 mmHg. Aorta   The aortic root is normal in size.   The ascending aorta is normal in size. Measurements 2D Measurements ---------------------------------------------------------------------- Name                                 Value        Normal ---------------------------------------------------------------------- Parasternal 2D ---------------------------------------------------------------------- IVS Diastolic Thickness (2D)       1.17 cm     0.60-0.90 LVID Diastole (2D)  3.50 cm     8.17-7.11 LVIW Diastolic Thickness (2D)                               1.08 cm     0.60-0.95 LVID Systole (2D)                  2.15 cm     2.20-3.50 LA Dimension (2D)                  3.66 cm     2.70-3.80 Ao Root Diameter (2D)              3.29 cm     2.70-3.70 LV Ejection  Fraction 2D ---------------------------------------------------------------------- Visually Estimated EF                 70 %         54-74 M-mode Measurements ---------------------------------------------------------------------- Name                                 Value        Normal ---------------------------------------------------------------------- M-Mode ---------------------------------------------------------------------- TAPSE                              1.61 cm        >=1.60 LVOT/Aortic Valve Doppler Measurements ---------------------------------------------------------------------- Name                                 Value        Normal ---------------------------------------------------------------------- LVOT Doppler ---------------------------------------------------------------------- LVOT Peak Velocity                1.09 m/s               AoV Doppler ---------------------------------------------------------------------- AV Peak Velocity                  1.46 m/s               AV Peak Gradient                    8 mmHg               AV Mean Gradient                    5 mmHg           <20 AV V1/V2 Ratio                        0.75 Mitral Valve Measurements ---------------------------------------------------------------------- Name                                 Value        Normal ---------------------------------------------------------------------- MV Doppler ---------------------------------------------------------------------- MV E Peak Velocity                0.80 m/s               MV A Peak Velocity                0.50 m/s               MV E/A  1.61               MV Annular TDI ---------------------------------------------------------------------- MV E/e' (Septal)                     31.00        <=8.00 MV E/e' (Lateral)                    32.81        <=8.00 Tricuspid Valve Measurements ----------------------------------------------------------------------  Name                                 Value        Normal ---------------------------------------------------------------------- TV Regurgitation Doppler ---------------------------------------------------------------------- TR Peak Velocity                  1.49 m/s               TR Peak Gradient                    9 mmHg Aorta / Venous Measurements ---------------------------------------------------------------------- Name                                 Value        Normal ---------------------------------------------------------------------- IVC/SVC ---------------------------------------------------------------------- IVC Diameter (Exp 2D)              2.25 cm        <=2.10 Report Signatures Finalized by Amelia Jo  MD on 07/21/2019 11:53 AM Promoted by [place] on 07/21/2019 11:39 AM      Assessment:    Asked to reevaluate following RRT this morning -lightheaded and dizzy while ambulating with syncopal episode apparently lasting for 5 minutes after she sat back down.  No arrhythmias identified on telemetry.  Suspect orthostasis in the setting of prolonged hospitalization, history of hypotension requiring midodrine and anemia   Admitted with chest pain, hypotension, dizziness   Small to moderate pericardial effusion noted on serial echo studies during this hospitalization with normal ejection fraction.  No evidence of tamponade   Acute/chronic anemia   End-stage renal disease maintained on peritoneal dialysis   VQ scan during this hospitalization negative for pulmonary embolism   Hyperlipidemia    Plan:    Orthostatic blood pressures to be checked   Try to maintain euvolemic status.-She is maintained on peritoneal dialysis   With periods of hypotension and anemia, could consider PRBC transfusion.   She is maintained on midodrine   Twelve-lead EKG   It appears neurology is also been consulted    Signed by: [facility]  NP [place] (8am-5pm)  MD  Spectralink [phone] or [phone]  Dr Maida Sale [phone] (8am-5pm)  After hours, non urgent consult line [phone]  After Hours, urgent consults [phone]

## 2019-07-29 NOTE — Consults (Addendum)
NEUROLOGY CONSULTATION    Date Time: 07/29/19 12:13 PM  Patient Name: [name]  Attending Physician: Forest Becker, MD      Assessment & Plan:   Syncope.  I think most likely this was due to orthostasis.  She began having symptoms upon getting up, which appears to have been the trigger.  Post-event confusion is unlikely to be a seizure given she was observed and no seizure activity was seen.     Check orthostatics.   Further recommendations depending on results obtained.    ADDENDUM  Some drop in SBP going from lying to standing though not big change.  Certainly it seems reasonable to have HOB 30 degrees while in bed, OOB to chair as much as tolerates, continue Midodrine.  Compression stockings to be ordered.    History of Present Illness:   64 yo female with ESRD, COPD, DM, HLD, HTN, OSA who was admitted on 6/27 for body aches, stomach pain, CP, and shortness of breath, dizziness.  She was found to have cardiac effusion, hyperkalemia, hypotension.  During her stay - she also developed confusion felt to be due to Neurontin, narcotics, and hypotension.  Pt was set to be discharged soon.  Today there was an RRT due to pt having syncope event and appeared confused afterwards. There was concern for vasovagal event (when on the toilet) versus seizure.    Pt reports that she felt dizzy (LH) upon standing and went to use the toilet. Upon her return she felt further [facility] and nurse and sitter reports she had syncope event.  She did not appear pale.  A blood pressure once she was lying down was obtained - this was 147 systolic. Pt initially denied having passed out - but now says it is possible.  Pt was felt to be somewhat confused after this event - but no seizure activity was observed.    Past Medical History:     Past Medical History:   Diagnosis Date    Chronic obstructive pulmonary disease     Diabetes mellitus     Hyperlipidemia     Hypertension     Sleep apnea 2018       Meds:      Scheduled Meds: PRN  Meds:    atorvastatin, 40 mg, Oral, Daily  collagenase, , Topical, Q24H [place]  darbepoetin alfa, 60 mcg, Subcutaneous, Weekly  doxycycline, 100 mg, Oral, Q12H SCH  insulin lispro, 1-6 Units, Subcutaneous, QHS  insulin lispro, 2-10 Units, Subcutaneous, TID AC  lactobacillus/streptococcus, 1 capsule, Oral, Daily  lidocaine, 1 patch, Transdermal, Q24H SCH  midodrine, 10 mg, Oral, TID MEALS  pantoprazole, 40 mg, Oral, QAM AC  senna, 8.6 mg, Oral, QHS  sevelamer, 800 mg, Oral, TID MEALS  sodium chloride, 250 mL, Intravenous, Once  sodium hypochlorite, , Irrigation, Q12H        Continuous Infusions:   sodium chloride, , PRN  sodium chloride, , PRN  acetaminophen, 650 mg, Q6H PRN   Or  acetaminophen, 650 mg, Q6H PRN  bisacodyl, 5 mg, Daily PRN  dextrose, 15 g of glucose, PRN   And  dextrose, 12.5 g, PRN   And  glucagon (rDNA), 1 mg, PRN  lactulose, 20 g, BID PRN  LORazepam, 1 mg, Q4H PRN  melatonin, 3 mg, QHS PRN  naloxone, 0.2 mg, PRN  ondansetron, 4 mg, Q6H PRN   Or  ondansetron, 4 mg, Q6H PRN  oxyCODONE, 10 mg, Q3H PRN  I personally reviewed all of the medications.  Medication list generated using all available resources.    Allergies   Allergen Reactions    Lisinopril Hives    Metformin Hives       Social & Family History:     Social History     Socioeconomic History    Marital status: Legally Separated     Spouse name: Not on file    Number of children: Not on file    Years of education: Not on file    Highest education level: Not on file   Occupational History    Not on file   Tobacco Use    Smoking status: Never Smoker    Smokeless tobacco: Never Used   Vaping Use    Vaping Use: Never used   Substance and Sexual Activity    Alcohol use: Never    Drug use: Never    Sexual activity: Not Currently   Other Topics Concern    Not on file   Social History Narrative    Not on file     Social Determinants of Health     Financial Resource Strain:     Difficulty of Paying Living Expenses:    Food  Insecurity:     Worried About Charity fundraiser in the Last Year:     Arboriculturist in the Last Year:    Transportation Needs:     Film/video editor (Medical):     Lack of Transportation (Non-Medical):    Physical Activity:     Days of Exercise per Week:     Minutes of Exercise per Session:    Stress:     Feeling of Stress :    Social Connections:     Frequency of Communication with Friends and Family:     Frequency of Social Gatherings with Friends and Family:     Attends Religious Services:     Active Member of Clubs or Organizations:     Attends Music therapist:     Marital Status:    Intimate Partner Violence:     Fear of Current or Ex-Partner:     Emotionally Abused:     Physically Abused:     Sexually Abused:        Family History   Problem Relation Age of Onset    Diabetes Mother     Hypertension Mother     Diabetes Father     Hypertension Father     Diabetes Sister     Hypertension Sister     Diabetes Brother     Hypertension Brother     Diabetes Paternal Aunt     Diabetes Paternal Uncle        Review of Systems:   No eye, ear nose, throat problems; no coughing or wheezing or shortness of breath, No chest pain or orthopnea, no abdominal pain, nausea or vomiting, No pain in the body or extremities, no psychiatric, neurological, endocrine, hematological or cardiac complaints except as noted above.     Physical Exam:   Blood pressure 152/64, pulse 70, temperature 98.1 F (36.7 C), temperature source Oral, resp. rate 16, height 1.575 m (5\' 2" ), weight 108.8 kg (239 lb 13.8 oz), SpO2 100 %.    HEENT: Normocephalic. Non-icter, no congestion, no carotid bruits  Lungs:  CTA bil  Cardiac:  S1,S2, normal rate and rhythm  Neck: supple, no lymphadenopathy, no thyromegaly, no JVD, no cartoid bruits  Extremities: no  clubbing, cyanosis, or edema  Skin: no rashes or lesions noted    Neuro:  Level of consciousness:  Alert and appropriate except a bit disinterested in the  process.  Trying often to watch tv.  Oriented:  Knows she is in a hospital - said "[facility]" as the name.  Knows the correct year.  Cognition:  Intact naming, recognition, concentration and following complex commands  Cranial Nerves:  II-XII intact  Strength:  No upper extremity drift  Coordination:  Intact FTN testingl  Sensation: Intact x 4 extremities to LT  Gait:  Deferred v    Labs:     Recent Labs   Lab 07/26/19  0919 07/23/19  0444   Glucose 235* 164*   BUN 68.0* 57.0*   Creatinine 7.8* 6.1*   Calcium 8.6 8.9   Sodium 138 137   Potassium 4.7 4.3   Chloride 100 100   CO2 23 22   Albumin  --  3.3*   Phosphorus  --  6.1*   Magnesium  --  2.2     Recent Labs   Lab 07/26/19  0321 07/25/19  0358 07/23/19  0444   WBC 9.53* 9.10 7.88   Hgb 8.1* 8.0* 7.7*   Hematocrit 27.2* 27.3* 24.8*   MCV 92.8 93.5 91.5   MCH 27.6 27.4 28.4   MCHC 29.8* 29.3* 31.0*   Platelets 240 211 179         No results for input(s): PTT, PT, INR in the last 72 hours.       Radiology Results (24 Hour)     ** No results found for the last 24 hours. **           All recent brain and spine imaging (MRI, CT) results reviewed.    Chart reviewed    Code status confirmed    Case discussed with: patient and Dr. Alba Destine    40 minutes; >50% time spent in counseling or coordination of care    Signed by: Merceda Elks, MD  Spectralink: [phone]       Answering Service: [phone]

## 2019-07-29 NOTE — Progress Notes (Signed)
Positive orthostasis; likely culprit in syncopal event    Underwent ACTH stimulation test with negative results (appropriate rise in cortisol)    Recheck labs tomorrow; check TSH    May consider florinef for orthostatic hypotension management, in addition to midodrine

## 2019-07-30 LAB — HEPATIC FUNCTION PANEL
ALT: 6 U/L (ref 0–55)
AST (SGOT): 10 U/L (ref 5–34)
Albumin/Globulin Ratio: 0.8 — ABNORMAL LOW (ref 0.9–2.2)
Alkaline Phosphatase: 114 U/L — ABNORMAL HIGH (ref 37–106)
Bilirubin Direct: 0.3 mg/dL (ref 0.0–0.5)
Bilirubin Indirect: 0.5 mg/dL (ref 0.2–1.0)
Bilirubin, Total: 0.8 mg/dL (ref 0.2–1.2)
Globulin: 4.1 g/dL — ABNORMAL HIGH (ref 2.0–3.6)
Protein, Total: 7.4 g/dL (ref 6.0–8.3)

## 2019-07-30 LAB — RENAL FUNCTION PANEL
Albumin: 3.3 g/dL — ABNORMAL LOW (ref 3.5–5.0)
Anion Gap: 17 — ABNORMAL HIGH (ref 5.0–15.0)
BUN: 51 mg/dL — ABNORMAL HIGH (ref 7.0–19.0)
CO2: 18 mEq/L — ABNORMAL LOW (ref 22–29)
Calcium: 8.8 mg/dL (ref 8.5–10.5)
Chloride: 102 mEq/L (ref 100–111)
Creatinine: 6.7 mg/dL — ABNORMAL HIGH (ref 0.6–1.0)
Glucose: 164 mg/dL — ABNORMAL HIGH (ref 70–100)
Phosphorus: 4.9 mg/dL — ABNORMAL HIGH (ref 2.3–4.7)
Potassium: 4.7 mEq/L (ref 3.5–5.1)
Sodium: 137 mEq/L (ref 136–145)

## 2019-07-30 LAB — GLUCOSE WHOLE BLOOD - POCT
Whole Blood Glucose POCT: 201 mg/dL — ABNORMAL HIGH (ref 70–100)
Whole Blood Glucose POCT: 182 mg/dL — ABNORMAL HIGH (ref 70–100)
Whole Blood Glucose POCT: 150 mg/dL — ABNORMAL HIGH (ref 70–100)
Whole Blood Glucose POCT: 180 mg/dL — ABNORMAL HIGH (ref 70–100)

## 2019-07-30 LAB — GFR: EGFR: 7.5

## 2019-07-30 LAB — TSH: TSH: 0.76 u[IU]/mL (ref 0.35–4.94)

## 2019-07-30 LAB — HEMOLYSIS INDEX: Hemolysis Index: 64 — ABNORMAL HIGH (ref 0–18)

## 2019-07-30 LAB — MAGNESIUM: Magnesium: 2.1 mg/dL (ref 1.6–2.6)

## 2019-07-30 MED ORDER — SODIUM CHLORIDE 0.9 % IV BOLUS
250.0000 mL | INTRAVENOUS | Status: AC | PRN
Start: 2019-07-30 — End: 2019-07-30

## 2019-07-30 MED ORDER — FLUDROCORTISONE ACETATE 0.1 MG PO TABS
0.1000 mg | ORAL_TABLET | Freq: Every day | ORAL | 1 refills | Status: DC
Start: 2019-07-30 — End: 2020-01-06

## 2019-07-30 MED ORDER — SODIUM CHLORIDE 0.9 % IV BOLUS
100.0000 mL | INTRAVENOUS | Status: AC | PRN
Start: 2019-07-30 — End: 2019-07-30

## 2019-07-30 MED ORDER — FLUDROCORTISONE ACETATE 0.1 MG PO TABS
0.1000 mg | ORAL_TABLET | Freq: Every day | ORAL | Status: DC
Start: 2019-07-30 — End: 2019-07-31
  Administered 2019-07-30 – 2019-07-31 (×2): 0.1 mg via ORAL
  Filled 2019-07-30 (×3): qty 1

## 2019-07-30 NOTE — Progress Notes (Signed)
Nutritional Support Services  Nutrition Note    [name] 64 y.o. female   MRN: [id number]      Referral Source: Census Review   Reason for Referral: LOS     Diet: Renal 80g protein - estimated protein needs within protein restriction, will continue currently    Education Provided: Education provided at recent admission     Clinical Nutrition Summary:  Spoke with RN. Reports pt declined breakfast this AM but would encourage intake at lunch. Pt known from multiple previous admissions- pattern appears to be that pt eats well throughout beginning of stay but then intake decreases somewhat due to being bored with food. Pt seen sitting up in bed AAOx4. Pt reports  generally good intake both pta and currently. Notes not always liking food, however consistently consuming 75-100% of meals throughout admission per flowsheet. No reports of N/V/D/C. Some slight fluctuations in weight, but overall weight stable- fluctuations likely related to HD. No overt signs of malnutrition. No additional issues reported at this time      No nutrition diagnosis at this time.      Patient is currently at low nutritional risk; will be available within 10 days and PRN.      Edsel Petrin MS, RDN, [place], [place]  Clinical Dietitian  Spectralink-[phone]

## 2019-07-30 NOTE — Progress Notes (Signed)
Pt getting dialyzed in her room,AOX4, vital signs stable. CVC clean,cry and,intact, cannot aspirate on the venous port but able to push. Treatment started.     07/30/19 1445   Vitals   Heart Rate 70   Resp Rate 16   BP 109/62   Machine Metrics   $Treatment Started/Capturing Charge Yes   Blood Flow Rate (mL/min) 450 mL/min   Arterial Pressure (mmHg) 19 mmHg   Venous Pressure (mmHg) 53   Dialysate Flow Rate (mL/min) 700 mL/min   Transmembrane Pressure (mmHg) 359 mmHg   Ultrafiltration Rate (mL/Hr) 860 mL/hr   Fluid Removal (ml) 0 ml   Fluid Bolus (ml) 200 ml   Dialysate K (mEq/L) 2 mEq/L   Dialysate CA (mEq/L) 2.5 mEq/L   Hemodialysis Comments   Arteriovenous Lines Secure Yes   Comments Treatment started

## 2019-07-30 NOTE — Progress Notes (Signed)
Dialysis discontinued due to malfunctioning catheter as ordered by nephrologist. Catheter exchange will be performed and pt will have dialysis post procedure.    07/30/19 1545   Treatment Summary   Time Off Machine 1515   Duration of Treatment (Hours) 0.5   Treatment Type 1:1   Dialyzer Clearance Lightly streaked   Fluid Volume Off (mL) 325   Prime Volume (mL) 200   Rinseback Volume (mL) 200   Fluid Given: Normal Saline (mL) 0   Fluid Given: PRBC  0 mL   Fluid Given: Albumin (mL) 0   Fluid Given: Other (mL) 0   Total Fluid Given 400   Hemodialysis Net Fluid Removed -75   Post Treatment Assessment   Post-Treatment Weight (Kg) 108.8   Patient Response to Treatment Tolerated   Permacath Catheter - Tunneled Right   No Placement Date or Time found.   Present on Admission?: Yes  Orientation: Right  Central Line Infection Prevention Education provided?: Yes   Line necessity reviewed? Apheresis/hemodialysis   Catheter Lumen Volume Venous 1.7 mL   Catheter Lumen Volume Arterial 1.7 mL   Dressing Status and Intervention Dressing Intact   Tego/Curos Caps on Catheter Yes   NEW Tego/Curos Caps placed (Date) 07/30/19   APHERESIS ONLY:  Access  Red   APHERESIS ONLY: Return Blue   End Caps Free From Blood Yes   Line Care Connections checked and tightened;Line pulled back;Zeroed and calibrated   Dressing Type Transparent   Dressing Status Clean;Dry;Intact   Dressing/Line Intervention Caps changed   Dressing Change Due 08/05/19   Vitals   Temp 97.9 F (36.6 C)   Heart Rate 74   Resp Rate 16   BP 146/68   Assessment   Mental Status Alert;Oriented;Cooperative   Cardiac (WDL)   (Monitored remotely)   Cardiac Regularity   (Monitored remotely)   Cardiac Symptoms None   Cardiac Rhythm   (Monitored remotely)   Respiratory  WDL   Respiratory Pattern Regular   Bilateral Breath Sounds Diminished   R Breath Sounds Diminished   L Breath Sounds Diminished   Cough No cough effort   Edema  WDL   Generalized Edema +1   RLE Edema +1   LLE Edema +1    General Skin Color Appropriate for ethnicity   Skin Condition/Temp Warm   Gastrointestinal (WDL) WDL   Abdomen Inspection Soft   GI Symptoms None   Mobility Bed   Pain Assessment   Charting Type Reassessment   Pain Scale Used Numeric Scale (0-10)   Numeric Pain Scale   Pain Score 0   POSS Score 1   Education   Person taught Patient   Knowledge basis Substantial   Topics taught S&S of infection   Teaching Tools Explain   Reponse Verbalizes Understanding   Bedside Nurse Communication   Name of bedside RN - post dialysis [place]

## 2019-07-30 NOTE — Progress Notes (Addendum)
[place]     Date Time: 07/30/19 11:59 AM  Patient Name: [name],[name]     Patient Active Problem List   Diagnosis    Iron deficiency anemia secondary to inadequate dietary iron intake    Sideropenic dysphagia    Peritoneal dialysis catheter dysfunction, initial encounter    Peritonitis    Upper GI bleed    ESRD (end stage renal disease) on dialysis    Generalized abdominal pain    Thrombocytopenia    Hypotension    Type 2 diabetes mellitus, with long-term current use of insulin    GERD (gastroesophageal reflux disease)    Bowel incontinence    Right sided numbness    Uncontrolled type 2 diabetes mellitus with hyperglycemia    Hypertension    Hyperlipidemia    History of COPD    Gastritis with hemorrhage    History of anemia due to chronic kidney disease    Open wound, abdominal wall, lateral, subsequent encounter    Gout    Dizziness    Fall    History of CVA (cerebrovascular accident)    Shortness of breath     Assessment:    Patient admitted 07/20/2019 with generalized body aches and chest pain.    Echocardiogram 07/21/2019   Normal LVEF 65-70%   Mild MR   Moderate circumferential pericardial effusion without frank tamponade or hemodynamic compromise   Repeat echo on 07/25/2019 revealed small to moderate pericardial effusion without evidence of tamponade   CKD, on PD - now HD   Anemia   HLD, on statin   DM, A1c 9.8% - on insulin   Recent tx for abdominal wall abscess with MRSA Pseudomonas and Enterococcus - being followed by ID (Dr. Rebeca Alert) and wound ostomy RN team. Remains on antibiotic therapy.   RRT 07/29/2019 for lightheadedness / dizziness while ambulating - brief syncopal episode and period of confusion - most likely secondary to orthostasis.    No dysrhythmias noted on telemetry.   NSR with non-specific T wave abnormality on 12 lead ECG - unchanged from prior ECG.   Patient is currently on midodrine       Recommendations:       No ECG changes or further episodes of lightheadedness or dizziness.   Volume management per nephrology    Maintain adequate hydration and encourage slow changes in position.   Ongoing treatment of infectious process per ID, wound team   More optimal treatment of DM per primary team.   [place] will sign off at this time, but please do not hesitate to call with further concerns.      Subjective:   Denies chest pain, SOB.  Denies recurrent episodes of lightheadedness or dizziness    Medications:      Scheduled Meds: PRN Meds:    atorvastatin, 40 mg, Oral, Daily  collagenase, , Topical, Q24H SCH  darbepoetin alfa, 60 mcg, Subcutaneous, Weekly  doxycycline, 100 mg, Oral, Q12H SCH  insulin lispro, 1-6 Units, Subcutaneous, QHS  insulin lispro, 2-10 Units, Subcutaneous, TID AC  lactobacillus/streptococcus, 1 capsule, Oral, Daily  lidocaine, 1 patch, Transdermal, Q24H SCH  midodrine, 10 mg, Oral, TID MEALS  pantoprazole, 40 mg, Oral, QAM AC  senna, 8.6 mg, Oral, QHS  sevelamer, 800 mg, Oral, TID MEALS  sodium chloride, 250 mL, Intravenous, Once  sodium hypochlorite, , Irrigation, Q12H        Continuous Infusions:   sodium chloride, , PRN  sodium chloride, ,  PRN  acetaminophen, 650 mg, Q6H PRN   Or  acetaminophen, 650 mg, Q6H PRN  bisacodyl, 5 mg, Daily PRN  dextrose, 15 g of glucose, PRN   And  dextrose, 12.5 g, PRN   And  glucagon (rDNA), 1 mg, PRN  lactulose, 20 g, BID PRN  LORazepam, 1 mg, Q4H PRN  melatonin, 3 mg, QHS PRN  naloxone, 0.2 mg, PRN  ondansetron, 4 mg, Q6H PRN   Or  ondansetron, 4 mg, Q6H PRN  oxyCODONE, 10 mg, Q3H PRN  sodium chloride, 100 mL, Q1H PRN  sodium chloride, 250 mL, PRN          Home Meds:   Medications Prior to Admission   Medication Sig Dispense Refill Last Dose    atorvastatin (LIPITOR) 40 MG tablet Take 40 mg by mouth daily       collagenase (SANTYL) ointment Apply topically every 24 hours 90 g 0     Dulaglutide (Trulicity) 4.76 LY/6.5KP Solution Pen-injector Inject 0.75  mg into the skin once a week       famotidine (PEPCID) 40 MG tablet Take 40 mg by mouth daily       insulin aspart (NovoLOG) 100 UNIT/ML injection Inject 5 Units into the skin 3 (three) times daily before meals (Patient taking differently: Inject 15 Units into the skin 3 (three) times daily before meals   )       lactobacillus/streptococcus (RISAQUAD) Cap Take 1 capsule by mouth daily 30 capsule 0     lidocaine (LIDODERM) 5 % Place 1 patch onto the skin every 24 hours Remove & Discard patch within 12 hours or as directed by MD 30 each 0     nystatin (NYSTOP) powder Apply 1 application topically 2 (two) times daily 30 g 0     pantoprazole (PROTONIX) 40 MG tablet Take 1 tablet (40 mg total) by mouth 2 (two) times daily       sevelamer (RENVELA) 800 MG tablet Take 800 mg by mouth 3 (three) times daily with meals       vitamin C (ASCORBIC ACID) 500 MG tablet Take 1 tablet (500 mg total) by mouth daily 30 tablet 0     zinc Oxide (DESITIN) 40 % Paste paste Apply topically every 6 (six) hours       zinc sulfate (ZINCATE) 220 (50 Zn) MG capsule Take 1 capsule (220 mg total) by mouth daily 30 capsule 0     [DISCONTINUED] amLODIPine (NORVASC) 5 MG tablet Take 1 tablet (5 mg total) by mouth every 12 (twelve) hours       [DISCONTINUED] carvedilol (COREG) 25 MG tablet Take 1 tablet (25 mg total) by mouth every 12 (twelve) hours       [DISCONTINUED] gabapentin (NEURONTIN) 300 MG capsule Take 1 capsule (300 mg total) by mouth daily 30 capsule 0     [DISCONTINUED] insulin glargine (LANTUS) 100 UNIT/ML injection Inject 30 Units into the skin every 12 (twelve) hours (Patient taking differently: Inject 35 Units into the skin nightly   ) 1 pen 0     [DISCONTINUED] oxyCODONE (ROXICODONE) 5 MG immediate release tablet Take 1 tablet (5 mg total) by mouth every 6 (six) hours as needed for Pain 10 tablet 0     [DISCONTINUED] sodium hypochlorite (DAKIN'S QUARTER STRENGTH) 0.125 % Solution Irrigate with as directed daily 1  Bottle 0           Physical Exam:   BP 149/75    Pulse 71  Temp 99.4 F (37.4 C) (Oral)    Resp 19    Ht 1.575 m (5\' 2" )    Wt 108.8 kg (239 lb 13.8 oz)    LMP  (LMP Unknown) Comment: post menopausal   SpO2 93%    BMI 43.87 kg/m  O2 Flow Rate (Young/min): 0 Young/min O2 Device: None (Room air)    Temp (24hrs), Avg:99.4 F (37.4 C), Min:97.2 F (36.2 C), Max:100.4 F (38 C)      Telemetry reviewed      Intake and Output Summary (Last 24 hours) at Date Time    Intake/Output Summary (Last 24 hours) at 07/30/2019 1159  Last data filed at 07/30/2019 0000  Gross per 24 hour   Intake 740 ml   Output 500 ml   Net 240 ml       General Appearance: No acute distress  Head:  normocephalic  Eyes:  EOM's intact  Neck:  No carotid bruit or jugular venous distension  Lungs:  Clear to auscultation without wheezes, rhonchi or rales  Chest Wall:  No tenderness or deformity  Heart:  S1 S2 normal No S3 or S4, without murmur/rub.    Abdomen:  Soft, non-tender, positive bowel sounds  Extremities:  Trace LE edema  Pulses:  2+ pedal, radial, brachial pulses equal bilateraly  Neurologic:  Alert and oriented x3, mood and affect normal  Musculoskeletal: normal strength and tone    Labs:     Recent Labs   Lab 07/26/19  1353 07/26/19  1045   Troponin I <0.01 <0.01     Recent Labs   Lab 07/30/19  0415   Bilirubin, Total 0.8   Bilirubin Direct 0.3   Protein, Total 7.4   Albumin 3.3*   ALT 6   AST (SGOT) 10     Recent Labs   Lab 07/30/19  0415   Magnesium 2.1         Recent Labs   Lab 07/26/19  0321 07/25/19  0358   WBC 9.53* 9.10   Hgb 8.1* 8.0*   Hematocrit 27.2* 27.3*   Platelets 240 211     Recent Labs   Lab 07/30/19  0415 07/26/19  0919   Sodium 137 138   Potassium 4.7 4.7   Chloride 102 100   CO2 18* 23   BUN 51.0* 68.0*   Creatinine 6.7* 7.8*   EGFR 7.5 6.3   Glucose 164* 235*   Calcium 8.8 8.6       Lab Results   Component Value Date    BNP 277 (H) 07/20/2019       Weight Monitoring 07/18/2019 07/20/2019 07/21/2019 07/21/2019 07/22/2019 07/23/2019  07/26/2019   Height - 157.5 cm - - - - -   Height Method - - - - - - -   Weight 107.6 kg 107.6 kg 107.321 kg 104.8 kg 107.2 kg 108.8 kg 108.8 kg   Weight Method Bed Scale - Standing Scale - Bed Scale Bed Scale -   BMI (calculated) - 43.5 kg/m2 - - - - -         Imaging:   REPEAT ECHO to evaluate for pericardial effusion 07/25/19  Patient Information    Name MRN Description   NADINE RYLE [id number] 64 y.o. female   Study Result    Narrative & Impression   Name:     SHERANDA SEABROOKS  Age:     64 years  DOB:     05-Apr-1955  Gender:  Female  MRN:     [id number]  Wt:     240 lb  BSA:     4.16 m2  Systolic BP:     606 mmHg  Diastolic BP:     74 mmHg  Technical Quality:     Fair  Exam Date/Time:     07/25/2019 9:59 AM  Exam Type:     ECHOCARDIOGRAM ADULT LIMITED W CLR \\T \ DOPP WAVFRM LTD    Staff  Sonographer:     Hassel Neth, RDCS  Ordering Physician:     Junious Silk    Study Info  Indications      I31.9 - Disease of pericardium,  unspecified  Procedure    Limited two-dimensional transthoracic echocardiogram is performed.    62 in      Summary    * limited study for reevaluation pericardial effusion.    * Small to moderate pericardial effusion without evidence of tamponade(no rv  collapse, no ivc plethora, there is respiratory variation of mitral valve  inflow).    * Pleural effusion present.    * Normal left ventricular function ef 65%.      Inferior Vena Cava    The inferior vena cava is normal.    Greater than 50% collapse in the diameter of the inferior vena cava with  inspiration and no dilatation consistent with normal right atrial pressure.      Measurements  2D Measurements  ----------------------------------------------------------------------  Name                                 Value        Normal  ----------------------------------------------------------------------    LV Ejection Fraction 2D  ----------------------------------------------------------------------  Visually Estimated EF                  64 %         54-74  Aorta / Venous Measurements  ----------------------------------------------------------------------  Name                                 Value        Normal  ----------------------------------------------------------------------    IVC/SVC  ----------------------------------------------------------------------  IVC Diameter (Exp 2D)              2.12 cm        <=2.10      Report Signatures  Finalized by Amelia Jo  MD on 07/25/2019 11:19 AM  Promoted by Hassel Neth on 07/25/2019 11:04 AM          ECHOCARDIOGRAM 07/21/2019  Patient Information    Name MRN Description   SHENAE BONANNO [id number] 64 y.o. female   Study Result    Narrative & Impression   Name:     YILIN WEEDON  Age:     44 years  DOB:     [birth year]  Gender:     Female  MRN:     [id number]  Systolic BP:     87 mmHg  Diastolic BP:     71 mmHg  Technical Quality:     Adequate  Exam Date/Time:     07/21/2019 11:03 AM  Exam Location:     [facility]  Exam Type:     ECHOCARDIOGRAM ADULT COMP W CLR/ [place] BUBBLE STDY    Staff  Sonographer:  Curly [place], RCS  Ordering Physician:     Maricela Bo    Study Info  Indications       - rule out cardiac tamponade  Procedure    Complete two-dimensional, color flow and spectral Doppler transthoracic  echocardiogram is performed.      Summary    * Left ventricular ejection fraction is normal with an estimated ejection  fraction of 65-70%.    * There is mild mitral regurgitation.    * Small to moderate cirumferential pericardial effusion visualized.    * No  right atrial or right ventricular collapse, ivc with normal size and  respiratory variation.  There is increased respiratory variation of the mitral  valve inflow, but no frank tampoade or hemodynamic compromise.      Findings  Left Ventricle    The left ventricle is small.    Left ventricular wall thickness is mildly increased.    Left ventricular ejection fraction is normal with an estimated  ejection  fraction of 65-70%.    Left ventricular segmental wall motion is Empty.    Left ventricular diastolic filling parameters Empty.  Right Ventricle    The right ventricular cavity size is Empty.    Normal right ventricular systolic function.      Left Atrium    The left atrium is Empty.    Right Atrium    The right atrium is Empty.    Atrial Septum    No evidence of interatrial shunt by color Doppler.      Aortic Valve    The aortic valve is tricuspid.    There is no aortic stenosis.    There is no aortic regurgitation.    Pulmonary Valve    The pulmonic valve is structurally normal.    There is no pulmonic regurgitation.    Mitral Valve    The mitral valve is structurally normal.    There is mild mitral regurgitation.    There is respiratory variation of the mitral valve inflow.    Tricuspid Valve    The tricuspid valve is structurally normal.    There is trace to mild tricuspid regurgitation.    No pulmonary hypertension with estimated right ventricular systolic pressure  of 9 mm hg.      Pericardium / Pleural Effusion    Small to moderate cirumferential pericardial effusion visualized.    No right atrial or right ventricular collapse, ivc with normal size and  respiratory variation. There is increased respiratory variation of the mitral  valve inflow, but no frank tampoade or hemodynamic compromise.    Inferior Vena Cava    The IVC is normal in size with > 50% respiratory variance consistent with  normal RA pressure of 3 mmHg.    Aorta    The aortic root is normal in size.    The ascending aorta is normal in size.      Measurements  2D Measurements  ----------------------------------------------------------------------  Name                                 Value        Normal  ----------------------------------------------------------------------    Parasternal 2D  ----------------------------------------------------------------------  IVS Diastolic Thickness (2D)       1.17 cm      0.60-0.90   LVID Diastole (2D)                 3.50  cm     1.47-0.92   LVIW Diastolic Thickness  (2D)                               1.08 cm     0.60-0.95   LVID Systole (2D)                  2.15 cm     2.20-3.50   LA Dimension (2D)                  3.66 cm     2.70-3.80   Ao Root Diameter (2D)              3.29 cm     2.70-3.70     LV Ejection Fraction 2D  ----------------------------------------------------------------------  Visually Estimated EF                 70 %         54-74  M-mode Measurements  ----------------------------------------------------------------------  Name                                 Value        Normal  ----------------------------------------------------------------------    M-Mode  ----------------------------------------------------------------------  TAPSE                              1.61 cm        >=1.60  LVOT/Aortic Valve Doppler Measurements  ----------------------------------------------------------------------  Name                                 Value        Normal  ----------------------------------------------------------------------    LVOT Doppler  ----------------------------------------------------------------------  LVOT Peak Velocity                1.09 m/s                   AoV Doppler  ----------------------------------------------------------------------  AV Peak Velocity                  1.46 m/s                 AV Peak Gradient                    8 mmHg                 AV Mean Gradient                    5 mmHg           <20   AV V1/V2 Ratio                        0.75  Mitral Valve Measurements  ----------------------------------------------------------------------  Name                                 Value        Normal  ----------------------------------------------------------------------    MV Doppler  ----------------------------------------------------------------------  MV E Peak Velocity                0.80 m/s  MV A Peak Velocity                 0.50 m/s                 MV E/A                                1.61                   MV Annular TDI  ----------------------------------------------------------------------  MV E/e' (Septal)                     31.00        <=8.00   MV E/e' (Lateral)                    32.81        <=8.00  Tricuspid Valve Measurements  ----------------------------------------------------------------------  Name                                 Value        Normal  ----------------------------------------------------------------------    TV Regurgitation Doppler  ----------------------------------------------------------------------  TR Peak Velocity                  1.49 m/s                 TR Peak Gradient                    9 mmHg  Aorta / Venous Measurements  ----------------------------------------------------------------------  Name                                 Value        Normal  ----------------------------------------------------------------------    IVC/SVC  ----------------------------------------------------------------------  IVC Diameter (Exp 2D)              2.25 cm        <=2.10      Report Signatures  Finalized by Amelia Jo  MD on 07/21/2019 11:53 AM  Promoted by Curly [place] on 07/21/2019 11:39 AM           Signed by: Fabian November, NP      [facility]  NP [place] (8am-5pm)  MD Spectralink [phone] (8am-5pm)  After hours, non urgent consult line [phone]  After Hours, urgent consults or to reach the on-cal MD [phone]

## 2019-07-30 NOTE — Plan of Care (Signed)
Patient is A&O*4.  PRN Roxicodone administered once for management of pain.  Compression stockings placed on patient. HD was not able to have dialysis r/t issues with dialysis catheter.  Patient is expected to have new dialysis catheter placed tomorrow.  Dialysis expected to follow catheter placement.  Fall bundle in place.  Will continue to monitor.    Problem: Safety  Goal: Patient will be free from injury during hospitalization  Outcome: Progressing  Flowsheets (Taken 07/30/2019 1835)  Patient will be free from injury during hospitalization:   Assess patient's risk for falls and implement fall prevention plan of care per policy   Use appropriate transfer methods   Provide and maintain safe environment   Ensure appropriate safety devices are available at the bedside   Hourly rounding   Include patient/ family/ care giver in decisions related to safety  Problem: Pain  Goal: Pain at adequate level as identified by patient  Outcome: Progressing  Flowsheets (Taken 07/30/2019 1835)  Pain at adequate level as identified by patient:   Identify patient comfort function goal   Assess pain on admission, during daily assessment and/or before any "as needed" intervention(s)   Reassess pain within 30-60 minutes of any procedure/intervention, per Pain Assessment, Intervention, Reassessment (AIR) Cycle   Evaluate patient's satisfaction with pain management progress   Evaluate if patient comfort function goal is met   Offer non-pharmacological pain management interventions   Include patient/patient care companion in decisions related to pain management as needed     Problem: Side Effects from Pain Analgesia  Goal: Patient will experience minimal side effects of analgesic therapy  Outcome: Progressing  Flowsheets (Taken 07/30/2019 1835)  Patient will experience minimal side effects of analgesic therapy:   Monitor/assess patient's respiratory status (RR depth, effort, breath sounds)   Assess for changes in cognitive function    Prevent/manage side effects per LIP orders (i.e. nausea, vomiting, pruritus, constipation, urinary retention, etc.)   Evaluate for opioid-induced sedation with appropriate assessment tool (i.e. POSS)

## 2019-07-30 NOTE — Progress Notes (Signed)
[facility] PROGRESS NOTE  Aaron Edelman, x [phone] ([place])      Date Time: 07/30/19 11:55 AM  Patient Name: [name]  Attending Physician: Forest Becker, MD    CC: follow-up ESRD, provision of hemodialysis    Assessment:     1. ESRD on HD MWF at [facility] Dr. Aline August  2. Hyperkalemia - resolved  3. Hypotension - on midodrine  - neg stim test  4. Anemia in CKD  5. Mild metabolic acidosis - resolved    Recommendations:   1. HD today as ordered  2. Continue HD q MWF  3. Stable from renal standpoint - will follow at  [facility]      Case discussed with: patient, HD RN    ADDENDUM: Patient's dialysis catheter is not functioning.  P>Will Ask IR to reeplace Permcath and plan HD tomorrow .       Ulyses Amor, MD  [facility]  [phone] (office)  X [phone] [facility])      Subjective: doing well, no dizziness, SOB.    Review of Systems:   Cardio: chest pain, no edema  Pulm: no SOB  GI: no abdominal pain  MSK: right hand pain - improving    Physical Exam:     Vitals:    07/29/19 2344 07/30/19 0405 07/30/19 0737 07/30/19 1146   BP: 136/64 149/73 152/77 149/75   Pulse: 79 72 76 71   Resp:  17 18 19    Temp: 100.4 F (38 C)  100 F (37.8 C) 99.4 F (37.4 C)   TempSrc: Oral  Oral Oral   SpO2: 93% 95% 94% 93%   Weight:       Height:           Intake and Output Summary (Last 24 hours) at Date Time    Intake/Output Summary (Last 24 hours) at 07/30/2019 1155  Last data filed at 07/30/2019 0000  Gross per 24 hour   Intake 740 ml   Output 500 ml   Net 240 ml       General: awake, alert, oriented x 3, no acute distress.  Cardiovascular: regular rate and rhythm  Lungs: clear to auscultation bilaterally, bilateral air entry, normal work of breathing  Abdomen: soft, NT, ND  Extremities: no edema    Access:  R IJ HDTC    Meds:      Scheduled Meds: PRN Meds:    atorvastatin, 40 mg, Oral, Daily  collagenase, , Topical, Q24H SCH  darbepoetin alfa, 60 mcg, Subcutaneous,  Weekly  doxycycline, 100 mg, Oral, Q12H SCH  insulin lispro, 1-6 Units, Subcutaneous, QHS  insulin lispro, 2-10 Units, Subcutaneous, TID AC  lactobacillus/streptococcus, 1 capsule, Oral, Daily  lidocaine, 1 patch, Transdermal, Q24H SCH  midodrine, 10 mg, Oral, TID MEALS  pantoprazole, 40 mg, Oral, QAM AC  senna, 8.6 mg, Oral, QHS  sevelamer, 800 mg, Oral, TID MEALS  sodium chloride, 250 mL, Intravenous, Once  sodium hypochlorite, , Irrigation, Q12H          Continuous Infusions:   sodium chloride, , PRN  sodium chloride, , PRN  acetaminophen, 650 mg, Q6H PRN   Or  acetaminophen, 650 mg, Q6H PRN  bisacodyl, 5 mg, Daily PRN  dextrose, 15 g of glucose, PRN   And  dextrose, 12.5 g, PRN   And  glucagon (rDNA), 1 mg, PRN  lactulose, 20 g, BID PRN  LORazepam, 1 mg, Q4H PRN  melatonin, 3 mg, QHS PRN  naloxone,  0.2 mg, PRN  ondansetron, 4 mg, Q6H PRN   Or  ondansetron, 4 mg, Q6H PRN  oxyCODONE, 10 mg, Q3H PRN  sodium chloride, 100 mL, Q1H PRN  sodium chloride, 250 mL, PRN              Labs:     Recent Labs   Lab 07/26/19  0321 07/25/19  0358   WBC 9.53* 9.10   Hgb 8.1* 8.0*   Hematocrit 27.2* 27.3*   Platelets 240 211     Recent Labs   Lab 07/30/19  0415 07/26/19  0919   Sodium 137 138   Potassium 4.7 4.7   Chloride 102 100   CO2 18* 23   BUN 51.0* 68.0*   Creatinine 6.7* 7.8*   Calcium 8.8 8.6   Albumin 3.3*  --    Phosphorus 4.9*  --    Magnesium 2.1  --    Glucose 164* 235*   EGFR 7.5 6.3     : No results found.        Signed by: Ulyses Amor, MD, FASN

## 2019-07-30 NOTE — Plan of Care (Signed)
Problem: Moderate/High Fall Risk Score >5  Goal: Patient will remain free of falls  Outcome: Progressing  Flowsheets (Taken 07/29/2019 2100)  High (Greater than 13): HIGH-Bed alarm on at all times while patient in bed     Problem: Safety  Goal: Patient will be free from injury during hospitalization  Outcome: Progressing  Flowsheets (Taken 07/29/2019 0419 by Donia Ast, RN)  Patient will be free from injury during hospitalization:   Assess patient's risk for falls and implement fall prevention plan of care per policy   Use appropriate transfer methods   Ensure appropriate safety devices are available at the bedside   Provide and maintain safe environment   Include patient/ family/ care giver in decisions related to safety   Hourly rounding  Goal: Patient will be free from infection during hospitalization  Outcome: Progressing     Problem: Pain  Goal: Pain at adequate level as identified by patient  Outcome: Progressing  Flowsheets (Taken 07/28/2019 0206 by Donia Ast, RN)  Pain at adequate level as identified by patient:   Identify patient comfort function goal   Assess for risk of opioid induced respiratory depression, including snoring/sleep apnea. Alert healthcare team of risk factors identified.   Assess pain on admission, during daily assessment and/or before any "as needed" intervention(s)   Reassess pain within 30-60 minutes of any procedure/intervention, per Pain Assessment, Intervention, Reassessment (AIR) Cycle   Evaluate if patient comfort function goal is met   Offer non-pharmacological pain management interventions   Evaluate patient's satisfaction with pain management progress     Problem: Side Effects from Pain Analgesia  Goal: Patient will experience minimal side effects of analgesic therapy  Outcome: Progressing     Problem: Discharge Barriers  Goal: Patient will be discharged home or other facility with appropriate resources  Outcome: Progressing     Problem: Psychosocial and Spiritual Needs  Goal:  Demonstrates ability to cope with hospitalization/illness  Outcome: Progressing     Problem: Compromised Tissue integrity  Goal: Damaged tissue is healing and protected  Outcome: Progressing  Goal: Nutritional status is improving  Outcome: Progressing     Problem: Renal Instability  Goal: Fluid and electrolyte balance are achieved/maintained  Outcome: Progressing  Flowsheets (Taken 07/29/2019 0419 by Donia Ast, RN)  Fluid and electrolyte balance are achieved/maintained:   Monitor intake and output every shift   Monitor/assess lab values and report abnormal values   Provide adequate hydration   Assess and reassess fluid and electrolyte status   Monitor for muscle weakness   Assess for confusion/personality changes  Goal: Nutritional intake is adequate  Outcome: Progressing     Problem: Patient Receiving Advanced Renal Therapies  Goal: Therapy access site remains intact  Outcome: Progressing

## 2019-07-30 NOTE — Progress Notes (Signed)
Patient discharge to SNF, [facility], Nursing report [phone]. Patient in agreeable with discharge plan and orders. Transportation is set with PTS, Pick time 5pm.      07/30/19 1556   Discharge Disposition   Patient preference/choice provided? Yes   Physical Discharge Disposition SNF   Receiving facility, unit and room number:   ([facility])   Nursing report phone number:   [phone])   Facility fax number:   [phone])   Mode of Transportation Other (comment)  (PTS Stretcher)   Pick up time   (5PM)   Patient/Family/POA notified of transfer plan Patient informed only   Patient agreeable to discharge plan/expected d/c date? Yes   Bedside nurse notified of transport plan? Yes   CM Interventions   Follow up appointment scheduled? No   Reason no follow up scheduled? Discharge to SNF/AR/LTAC   Notified MD? Yes   Referral made for home health RN visit? Does not meet home bound criteria   Multidisciplinary rounds/family meeting before d/c? Yes   Medicare Checklist   Is this a Medicare patient? No   Patient received 1st IMM Letter? No

## 2019-07-30 NOTE — Discharge Summary (Signed)
[facility]      Patient: [name]  Admission Date: 07/20/2019   DOB: 1955/10/21  Discharge Date: 07/30/2019    MRN: [id number]  Discharge Attending:Gladiola Madore Draper     Referring Physician: Pcp, None, MD  PCP: Pcp, None, MD       DISCHARGE SUMMARY     Discharge Information   Admission Diagnosis:   Shortness of breath    Discharge Diagnosis:   Active Hospital Problems    Diagnosis    Shortness of breath   Small to moderate pericardial effusion no evidence of tamponade  Hyperkalemia  End-stage renal disease on dialysis  Acute encephalopathy due to a combination of hypotension and effects of medications i.e. Neurontin and narcotics  Right abdominal walls abscess/cellulitis infection with MRSA (will be treated with a total of 10 days of doxycycline she has 6 more days to go)  Deconditioned  Obesity  Metabolic acidosis secondary to end-stage renal disease  Acute superficial venous thrombosis status post dialysis catheter  Recurrent mechanical falls  Normocytic anemia most likely secondary to anemia of chronic disease/CKD  Chronic orthostatic hypotension, on midodrine/florinef      Admission Condition: Guarded  Discharge Condition: Stable and improved  Consultants: Dr. Corky Mull nephrology, Dr. Judge Stall infectious diseases Dr. Amelia Jo [facility] cardiology, Dr.  Chase Caller intensivist  Functional Status: Ambulatory  Discharged to: Skilled nursing facility    Discharge Medications:     Medication List      START taking these medications    acetaminophen 325 MG tablet  Commonly known as: TYLENOL  Take 2 tablets (650 mg total) by mouth every 6 (six) hours as needed for Pain or Fever     bisacodyl 5 MG EC tablet  Commonly known as: DULCOLAX  Take 1 tablet (5 mg total) by mouth daily as needed for Constipation     colchicine 0.6 MG tablet  Take 1 tablet (0.6 mg total) by mouth every 14 (fourteen) days Every 2 weeks next dose will be on 08/09/2019     darbepoetin alfa 60 MCG/0.3ML  injection  Commonly known as: ARANESP  Inject 0.3 mLs (60 mcg total) into the skin once a week     doxycycline 100 MG capsule  Commonly known as: MONODOX  Take 1 capsule (100 mg total) by mouth every 12 (twelve) hours for 6 days Treat till 08/03/2019     fludrocortisone 0.1 MG tablet  Commonly known as: FLORINEF  Take 1 tablet (0.1 mg total) by mouth daily     melatonin 3 mg tablet  Take 1 tablet (3 mg total) by mouth nightly as needed for Sleep     midodrine 10 MG tablet  Commonly known as: PROAMATINE  Take 1 tablet (10 mg total) by mouth 3 (three) times daily with meals     ondansetron 4 MG disintegrating tablet  Commonly known as: ZOFRAN-ODT  Take 1 tablet (4 mg total) by mouth every 6 (six) hours as needed for Nausea     senna 8.6 MG Tabs  Commonly known as: SENOKOT  Take 1 tablet (8.6 mg total) by mouth nightly        CHANGE how you take these medications    insulin aspart 100 UNIT/ML injection  Commonly known as: NovoLOG  Inject 5 Units into the skin 3 (three) times daily before meals  What changed: how much to take     oxyCODONE 10 MG immediate release tablet  Commonly known as: ROXICODONE  Take 1 tablet (  10 mg total) by mouth every 3 (three) hours as needed for Pain  What changed:    medication strength   how much to take   when to take this     sodium hypochlorite 0.125 % Soln  Commonly known as: DAKIN'S QUARTER STRENGTH  Irrigate with as directed daily Dressing changes twice daily  What changed: additional instructions        CONTINUE taking these medications    atorvastatin 40 MG tablet  Commonly known as: LIPITOR     collagenase ointment  Commonly known as: SANTYL  Apply topically every 24 hours     famotidine 40 MG tablet  Commonly known as: PEPCID     lactobacillus/streptococcus Caps  Take 1 capsule by mouth daily     lidocaine 5 %  Commonly known as: LIDODERM  Place 1 patch onto the skin every 24 hours Remove & Discard patch within 12 hours or as directed by MD     nystatin powder  Commonly known  as: NYSTOP  Apply 1 application topically 2 (two) times daily     pantoprazole 40 MG tablet  Commonly known as: PROTONIX  Take 1 tablet (40 mg total) by mouth 2 (two) times daily     sevelamer 800 MG tablet  Commonly known as: RENVELA     Trulicity 7.09 GG/8.3MO Sopn  Generic drug: Dulaglutide     vitamin C 500 MG tablet  Commonly known as: ASCORBIC ACID  Take 1 tablet (500 mg total) by mouth daily     zinc Oxide 40 % Pste paste  Commonly known as: DESITIN  Apply topically every 6 (six) hours     zinc sulfate 220 (50 Zn) MG capsule  Commonly known as: ZINCATE  Take 1 capsule (220 mg total) by mouth daily        STOP taking these medications    amLODIPine 5 MG tablet  Commonly known as: NORVASC     carvedilol 25 MG tablet  Commonly known as: COREG     ciprofloxacin 250 MG tablet  Commonly known as: CIPRO     gabapentin 300 MG capsule  Commonly known as: NEURONTIN     insulin glargine 100 UNIT/ML injection  Commonly known as: LANTUS           Where to Get Your Medications      These medications were sent to [facility], [address], [place] [address]    Phone: [phone]    fludrocortisone 0.1 MG tablet     You can get these medications from any pharmacy    Bring a paper prescription for each of these medications   oxyCODONE 10 MG immediate release tablet     Information about where to get these medications is not yet available    Ask your nurse or doctor about these medications   acetaminophen 325 MG tablet   bisacodyl 5 MG EC tablet   colchicine 0.6 MG tablet   darbepoetin alfa 60 MCG/0.3ML injection   doxycycline 100 MG capsule   melatonin 3 mg tablet   midodrine 10 MG tablet   ondansetron 4 MG disintegrating tablet   senna 8.6 MG Tabs   sodium hypochlorite 0.125 % [facility]   Presentation History   [name] is a 64 year old African-American lady who has a history of  end-stage renal disease  and was recently discharged to [facility] skilled nursing facility for PT OT and also for ongoing dialysis  She was brought back to the emergency room with a chief complaint of dizziness and chest pain.  She was admitted to the hospital and transferred to the ICU for episode of hypotension    See HPI for details.    Hospital Course (9 Days)   Patient was admitted to the ICU and was seen in consultation by Dr. Chase Caller intensivist as well as Dr. Amelia Jo cardiology and echocardiogram was done which revealed evidence of a pericardial effusion but no evidence of tamponade  She was seen by nephrology Dr. Corky Mull and associates and was initially treated with dialysis but no ultrafiltration because of hypotension  Patient was treated with crystalloids with improvement in her blood pressure readings  Broad-spectrum antibiotics were also added to the regimen as she had recently been treated for an abdominal wall infection  She was seen by Dr. Judge Stall infectious diseases who assisted with antibiotic choice and duration of treatment  She improved with above measures and was transferred out of the ICU  She had a follow-up echocardiogram done which revealed no evidence of tamponade.  Antibiotics were continued and to repeat cultures came back positive for MRSA and her antibiotics were deescalated to oral doxycycline  The patient continued to recuperate on the telemetry unit however she was somewhat somnolent at some point and her gabapentin was felt to be the culprit and this was discontinued with improvement in her mentation  Patient has participated in physical therapy she has been transfused with 1 unit of packed RBCs for anemia as well as treated with hemodialysis and ultrafiltration and she is almost back to her baseline  She however requires ongoing PT OT and will be discharged to the [facility] skilled nursing facility for ongoing physical therapy  She will be followed by the nephrology team  She will need to  follow-up with [facility] or her primary cardiologist in [place] to repeat an echocardiogram in about a week  She will continue doxycycline for the next 6 days to complete a course of antibiotics as recommended by infectious diseases  She also continues to complain of chest pain which was relieved by a dose of colchicine  Because of her end-stage renal disease and slow metabolism of colchicine in patients with end-stage renal disease as per guidelines she will she has received a dose of colchicine during the hospital stay and will need another dose of colchicine on August 08, 2020  She may be treated with another dose of colchicine 2 weeks thereafter  Further management will be based on follow-up echocardiogram findings      Prior to discharge, patient had syncopal episode.  Lightheadedness preceding syncope.  Orthostatics positive.  Already on maximal dose midodrine.  Started on florinef.  Prior ACTH stimulation test negative for adrenal insufficiency.  Neuro and cardiology cleared for discharge.  Discharged to SNF.       Best Practices   Was the patient admitted with either a CHF Exacerbation or Pneumonia?  No      Progress Note/Physical Exam at Discharge     Subjective: Patient feels well and ready for discharge    Vitals:    07/30/19 1445 07/30/19 1450 07/30/19 1500 07/30/19 1515   BP: 109/62  142/62 134/65   Pulse: 70 70 69 78   Resp: 16 16 16 16    Temp:  98.2 F (36.8 C)  TempSrc:       SpO2:       Weight:       Height:               General: Obese lady in no acute distress AAOx3  HEENT: perrla, eomi, sclera anicteric, OP: Clear, MMM  Neck: supple, FROM, no LAD  Cardiovascular: RRR, no m/r/g  Lungs: CTAB, no w/r/r  Abdomen: soft, +BS, NT/ND, no masses, no g/r  Extremities: no C/C/E  Skin: no rashes or lesions noted  Neuro: CN 2-12 intact; No Focal neurological deficits       Diagnostics     Labs/Studies Pending at Discharge: None    Last Labs   Recent Labs   Lab 07/26/19  0321 07/25/19  0358   WBC 9.53*  9.10   RBC 2.93* 2.92*   Hgb 8.1* 8.0*   Hematocrit 27.2* 27.3*   MCV 92.8 93.5   Platelets 240 211       Recent Labs   Lab 07/30/19  0415 07/26/19  0919   Sodium 137 138   Potassium 4.7 4.7   Chloride 102 100   CO2 18* 23   BUN 51.0* 68.0*   Creatinine 6.7* 7.8*   Glucose 164* 235*   Calcium 8.8 8.6   Magnesium 2.1  --        Microbiology Results     Procedure Component Value Units Date/Time    Blood Culture Aerobic/Anaerobic #1 [[id number]] Collected: 07/20/19 2052    Specimen: Arm from Blood, Venipuncture Updated: 07/26/19 0421    Narrative:      ORDER#: [id number]                                    ORDERED BY: Marcello Moores, SASHA  SOURCE: Blood, Venipuncture arm                      COLLECTED:  07/20/19 20:52  ANTIBIOTICS AT COLL.:                                RECEIVED :  07/20/19 21:25  Culture Blood Aerobic and Anaerobic        FINAL       07/26/19 04:21  07/26/19   No growth after 5 days of incubation.      Blood Culture Aerobic/Anaerobic #2 [[id number]] Collected: 07/20/19 2052    Specimen: Arm from Blood, Venipuncture Updated: 07/26/19 0421    Narrative:      ORDER#: [id number]                                    ORDERED BY: Randolm Idol  SOURCE: Blood, Venipuncture arm                      COLLECTED:  07/20/19 20:52  ANTIBIOTICS AT COLL.:                                RECEIVED :  07/20/19 21:25  Culture Blood Aerobic and Anaerobic        FINAL       07/26/19 04:21  07/26/19   No growth after 5 days of incubation.  COVID-19 (SARS-COV-2) [employer] [facility]) [[id number]] Collected: 07/21/19 0152    Specimen: Nasopharyngeal Swab from Nasopharynx Updated: 07/21/19 0324     Purpose of COVID testing Screening     SARS-CoV-2 Specimen Source Nasopharyngeal     SARS CoV 2 Overall Result Negative     Comment: Test performed using the Abbott ID NOW EUA assay.  Please see Fact Sheets for patients and providers located at:  http://olson-hall.info/    This test is for the qualitative detection of  SARS-CoV-2  (COVID19) nucleic acid. Viral nucleic acids may persist in vivo,  independent of viability. Detection of viral nucleic acid does  not imply the presence of infectious virus, or that virus  nucleic acid is the cause of clinical symptoms. Negative  results should be treated as presumptive and, if inconsistent  with clinical signs and symptoms or necessary for patient  management, should be tested with an alternative molecular  assay. Negative results do not preclude SARS-CoV-2 infection  and should not be used as the sole basis for patient  management decisions. Invalid results may be due to inhibiting  substances in the specimen and recollection should occur.         Narrative:      o Collect and clearly label specimen type:  o Upper respiratory specimen: One Nasopharyngeal Dry Swab NO  Transport Media.  o Hand deliver to laboratory ASAP  Indication for testing->Extended care facility admission to  semi private room  Screening    Culture + Gram Stain,Aerobic, Wound [[id number]] Collected: 07/22/19 1033    Specimen: Wound Updated: 07/24/19 0707    Narrative:      culturette  ORDER#: [id number]                                    ORDERED BY: Lahoma Crocker, ERIC  SOURCE: Wound abdominal wall                         COLLECTED:  07/22/19 10:33  ANTIBIOTICS AT COLL.:                                RECEIVED :  07/22/19 13:37  ORDER ENTRY COMMENTS:  culturette  Stain, Gram                                FINAL       07/22/19 15:20  07/22/19   Few WBCs             No Squamous epithelial cells seen             No organisms seen  Culture and Gram Stain, Aerobic, Wound     FINAL       07/24/19 07:07   +  07/24/19   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            Previous quantitation was Light growth of, verified by [id number] at 12:43 on 07/23/19  _____________________________________________________________________________                                      MRSA        ANTIBIOTICS  MIC  INTRP       _____________________________________________________________________________  Clindamycin                    <=0.5   S        Daptomycin                      <=1    S        Erythromycin                    >4     R        Gentamicin                      <=2    S        Levofloxacin                     4     I        Linezolid                        2     S        Oxacillin                       >2     R  D1    Penicillin                      >1     R        Rifampin                       <=0.5   S        Tetracycline                   <=0.5   S        Trimethoprim/Sulfamethoxazole <=0.5/9  S        Vancomycin                       1     S          -----DRUG COMMENTS----------    D1:  Oxacillin sensitivity predicts sensitivity to cephalosporins.         [place] Antimicrobial Subcommittee 2016  _____________________________________________________________________________            S=SUSCEPTIBLE     I=INTERMEDIATE     R=RESISTANT                            N/S=NON-SUSCEPTIBLE  _____________________________________________________________________________      MRSA culture - Nares [[id number]] Collected: 07/21/19 1058    Specimen: Culturette from Nares Updated: 07/22/19 1117     Culture MRSA Surveillance Negative for Methicillin Resistant Staph aureus    MRSA culture - Throat [[id number]] Collected: 07/21/19 1058    Specimen: Culturette from Throat Updated: 07/22/19 1117     Culture MRSA Surveillance Negative for Methicillin Resistant Staph aureus           Patient Instructions   Discharge Diet: Consistent carbohydrate cardiac diet  Discharge Activity: As tolerated    Follow Up Appointment:  Follow-up Information     Nevin Bloodgood, MD Follow up in 2 day(s).    Specialty: Nephrology  Contact information:  [place] [address]  [phone]             Tarri Glenn, MD Follow up in 1 week(s).    Specialty: Cardiology  Why: Follow-up echocardiogram to evaluate for pericardial  fluid  Contact information:  [address]  [address]  [place] [address]  [phone]             Pcp, None, MD .                  Time spent examining patient, discussing with patient/family regarding hospital course, chart review, reconciling medications and discharge planning:    35   minutes.    Signed,  Forest Becker    3:37 PM 07/30/2019

## 2019-07-30 NOTE — Plan of Care (Signed)
Problem: Renal Instability  Goal: Fluid and electrolyte balance are achieved/maintained  Flowsheets (Taken 07/30/2019 1640)  Fluid and electrolyte balance are achieved/maintained:   Monitor/assess lab values and report abnormal values   Assess and reassess fluid and electrolyte status   Observe for cardiac arrhythmias     Problem: Patient Receiving Advanced Renal Therapies  Goal: Therapy access site remains intact  Outcome: Progressing  Flowsheets (Taken 07/28/2019 1254 by Lendell Caprice, RN)  Therapy access site remains intact:   Assess therapy access site   Change therapy access site dressing as needed

## 2019-07-31 ENCOUNTER — Encounter
Admission: EM | Disposition: A | Discharge status: Skilled Nursing Facility | Payer: Self-pay | Source: Home / Self Care | Attending: Internal Medicine

## 2019-07-31 HISTORY — PX: TUNNELED CATH PLACEMENT (PERMCATH): IMG2664

## 2019-07-31 LAB — ECG 12-LEAD
Atrial Rate: 70 {beats}/min
P Axis: 48 degrees
P-R Interval: 134 ms
Q-T Interval: 432 ms
QRS Duration: 76 ms
QTC Calculation (Bezet): 466 ms
R Axis: 53 degrees
T Axis: 163 degrees
Ventricular Rate: 70 {beats}/min

## 2019-07-31 LAB — GLUCOSE WHOLE BLOOD - POCT
Whole Blood Glucose POCT: 190 mg/dL — ABNORMAL HIGH (ref 70–100)
Whole Blood Glucose POCT: 163 mg/dL — ABNORMAL HIGH (ref 70–100)
Whole Blood Glucose POCT: 158 mg/dL — ABNORMAL HIGH (ref 70–100)
Whole Blood Glucose POCT: 205 mg/dL — ABNORMAL HIGH (ref 70–100)

## 2019-07-31 SURGERY — TUNNELED CATH PLACEMENT
Site: Chest | Laterality: Right

## 2019-07-31 MED ORDER — MIDAZOLAM HCL 1 MG/ML IJ SOLN (WRAP)
INTRAMUSCULAR | Status: AC | PRN
Start: 2019-07-31 — End: 2019-07-31
  Administered 2019-07-31: 0.5 mg via INTRAVENOUS

## 2019-07-31 MED ORDER — LIDOCAINE HCL 1 % IJ SOLN
INTRAMUSCULAR | Status: AC
Start: 2019-07-31 — End: ?
  Filled 2019-07-31: qty 20

## 2019-07-31 MED ORDER — FENTANYL CITRATE (PF) 50 MCG/ML IJ SOLN (WRAP)
INTRAMUSCULAR | Status: AC | PRN
Start: 2019-07-31 — End: 2019-07-31
  Administered 2019-07-31: 50 ug via INTRAVENOUS

## 2019-07-31 MED ORDER — MIDAZOLAM HCL 1 MG/ML IJ SOLN (WRAP)
INTRAMUSCULAR | Status: AC
Start: 2019-07-31 — End: ?
  Filled 2019-07-31: qty 2

## 2019-07-31 MED ORDER — LIDOCAINE HCL 1 % IJ SOLN
INTRAMUSCULAR | Status: AC | PRN
Start: 2019-07-31 — End: 2019-07-31
  Administered 2019-07-31: 10 mL

## 2019-07-31 MED ORDER — FENTANYL CITRATE (PF) 50 MCG/ML IJ SOLN (WRAP)
INTRAMUSCULAR | Status: AC | PRN
Start: 2019-07-31 — End: 2019-07-31
  Administered 2019-07-31: 25 ug via INTRAVENOUS

## 2019-07-31 MED ORDER — FENTANYL CITRATE (PF) 50 MCG/ML IJ SOLN (WRAP)
INTRAMUSCULAR | Status: AC
Start: 2019-07-31 — End: ?
  Filled 2019-07-31: qty 2

## 2019-07-31 MED ORDER — ALBUMIN HUMAN 25 % IV SOLN
100.0000 mL | INTRAVENOUS | Status: AC | PRN
Start: 2019-07-31 — End: 2019-07-31

## 2019-07-31 SURGICAL SUPPLY — 10 items
CATHETER BALLOON DILATATION L60 MM L75 (Balloons) ×1 IMPLANT
CATHETER BLNDIL NYBAX MUSTANG 12MM 75CM (Balloons) ×1
CATHETER HEMODIALYSIS OD14.5 FR L28 CM (Catheter) ×1 IMPLANT
CATHETER HEMODIALYSIS OD14.5 FR L28 CM EQUISTREAM L23 CM STRAIGHT (Catheter) IMPLANT
CATHETER HMDIAL PU STRG EQUISTREAM ARGD (Catheter) ×1 IMPLANT
CATHETER ODSEC12 MM L75 CM MUSTANG BALLOON DILATATION L60 MM NYBAX (Balloons) IMPLANT
CATHETER ODSEC12 MM L75 CM MUSTANGâ„¢ BALLOON DILATATION L60 MM NYBAX (Balloons) ×1 IMPLANT
GUIDEWIRE VASC NTNL TUNG PU HDRPH GLDWR (Guidwire) ×1
GUIDEWIRE VASCULAR OD.035 IN L180 CM L3 (Guidwire) ×1 IMPLANT
GUIDEWIRE VASCULAR OD.035 IN L180 CM L3 CM GLIDEWIRE STANDARD ANGLE (Guidwire) IMPLANT

## 2019-07-31 NOTE — Sedation Documentation (Addendum)
Patient to CVIR A for Tunneled Dialysis Catheter Exchange with Dr Stevie Kern.     Patient supine. Right chest prepped. Lidocaine used. Catheter exchanged over the wire. Sutured and secured in place. 20cm 14.5 Fr Equistream long term dialysis catheter LOT [id number]. Dressing CDI. Patient tolerated procedure well. VSS. Report called to RN. Patient returned to room 4025 when sedation criteria met.     Fentanyl: 100 mcg  Versed: 2 mg

## 2019-07-31 NOTE — Progress Notes (Signed)
Progress Note    Date Time: 07/31/19 8:21 AM  Patient Name: [name]  Attending Physician: Forest Becker, MD      Assessment & Plan:   Syncope - likely orthostatic related.  Says she hasn't been up today.    -  Discussed placing compression stockings with nurse.  -  Will follow episodically    Subjective:   Patient Seen and Examined. The notes from the last 24 hours were reviewed.  Pt with no symptoms but says she hasn't been up today.     Review of Systems:   No headache, eye, ear nose, throat problems; no coughing or wheezing or shortness of breath, No chest pain or orthopnea, no abdominal pain, nausea or vomiting, No pain in the body or extremities, no psychiatric, neurological, endocrine, hematological or cardiac complaints except as noted above.     Physical Exam:   Blood pressure 156/68, pulse 85, temperature 98.2 F (36.8 C), temperature source Oral, resp. rate 19, height 1.575 m (5\' 2" ), weight 108.8 kg (239 lb 13.8 oz), SpO2 93 %.    Alert  Unconcerned, it appears  Face symmetric    Meds:      Scheduled Meds: PRN Meds:    atorvastatin, 40 mg, Oral, Daily  collagenase, , Topical, Q24H SCH  darbepoetin alfa, 60 mcg, Subcutaneous, Weekly  doxycycline, 100 mg, Oral, Q12H SCH  fludrocortisone, 0.1 mg, Oral, Daily  insulin lispro, 1-6 Units, Subcutaneous, QHS  insulin lispro, 2-10 Units, Subcutaneous, TID AC  lactobacillus/streptococcus, 1 capsule, Oral, Daily  lidocaine, 1 patch, Transdermal, Q24H SCH  midodrine, 10 mg, Oral, TID MEALS  pantoprazole, 40 mg, Oral, QAM AC  senna, 8.6 mg, Oral, QHS  sevelamer, 800 mg, Oral, TID MEALS  sodium chloride, 250 mL, Intravenous, Once  sodium hypochlorite, , Irrigation, Q12H        Continuous Infusions:   sodium chloride, , PRN  sodium chloride, , PRN  acetaminophen, 650 mg, Q6H PRN   Or  acetaminophen, 650 mg, Q6H PRN  bisacodyl, 5 mg, Daily PRN  dextrose, 15 g of glucose, PRN   And  dextrose, 12.5 g, PRN   And  glucagon (rDNA), 1 mg, PRN  lactulose, 20 g,  BID PRN  LORazepam, 1 mg, Q4H PRN  melatonin, 3 mg, QHS PRN  naloxone, 0.2 mg, PRN  ondansetron, 4 mg, Q6H PRN   Or  ondansetron, 4 mg, Q6H PRN  oxyCODONE, 10 mg, Q3H PRN            I personally reviewed all of the medications    Labs:     Recent Labs   Lab 07/30/19  0415 07/26/19  0919   Glucose 164* 235*   BUN 51.0* 68.0*   Creatinine 6.7* 7.8*   Calcium 8.8 8.6   Sodium 137 138   Potassium 4.7 4.7   Chloride 102 100   CO2 18* 23   Albumin 3.3*  --    Phosphorus 4.9*  --    Magnesium 2.1  --    AST (SGOT) 10  --    ALT 6  --    Bilirubin, Total 0.8  --    Alkaline Phosphatase 114*  --      Recent Labs   Lab 07/26/19  0321 07/25/19  0358   WBC 9.53* 9.10   Hgb 8.1* 8.0*   Hematocrit 27.2* 27.3*   MCV 92.8 93.5   MCH 27.6 27.4   MCHC 29.8* 29.3*   Platelets 240 211  No results for input(s): PTT, PT, INR in the last 72 hours.       Radiology Results (24 Hour)     ** No results found for the last 24 hours. **           All recent brain and spine imaging (MRI, CT) results reviewed.    Code status listed in chart confirmed    Chart reviewed    Case discussed with: patient and nurse    35 minutes; >50% time spent in counseling or coordination of care    Signed by: Merceda Elks, MD  [place]: [phone]       Answering Service: [phone]

## 2019-07-31 NOTE — Progress Notes (Addendum)
[place] at [phone] - no answer/ VM     1855: Called [facility] at [phone] ex. [phone] - called was transferred but person hung up

## 2019-07-31 NOTE — Brief Op Note (Signed)
[facility]  [facility]   Brief Op Note     Physician(s): Khamani Daniely C Kalaysia Demonbreun, MD    Pre-operative Diagnosis: ESRD, poorly functioning tunneled catheter    Post-operative Diagnosis: Diagnosis is same as preop diagnosis    Procedure(s) Performed: Tunneled dialysis catheter exchange with fibrin sheath PTA                                                                                                                                                                                                               Anesthesia:  Moderate Sedation and Local with 1% Lidocaine    Complications: None    Estimated Blood Loss:  Minimal    Blood Aministered:  None    Fluid Aministered:  Per Nursing    Tubes and Drains: 23 cm Equistream right IJ    Specimens: None    Findings:  Tunneled dialysis catheter replaced after 20mm PTA of fibrin sheath.  Catheter is ready for use.    Patient was transferred from the procedure room to the nursing unit in stable condition.  Procedure note dictated.    Signed by: Jacques Earthly, MD  [facility]  [place] [phone]  [place] [phone]

## 2019-07-31 NOTE — Progress Notes (Signed)
Call from [place] @ [facility] with request for HD documents.  CXR, HBsAb and HD flow sheets were sent over via [place], HD Care Coordinator  [phone]

## 2019-07-31 NOTE — Progress Notes (Signed)
Pt en route to CVIR for perma cath replacment

## 2019-07-31 NOTE — Discharge Summary (Signed)
[facility]      Patient: [name]  Admission Date: 07/20/2019   DOB: Nov 20, 1955  Discharge Date: 07/31/2019    MRN: [id number]  Discharge Attending:Amol Domanski Young     Referring Physician: Pcp, None, MD  PCP: Pcp, None, MD       DISCHARGE SUMMARY     Discharge Information   Admission Diagnosis:   Shortness of breath    Discharge Diagnosis:   Active Hospital Problems    Diagnosis    Shortness of breath    ESRD (end stage renal disease) on dialysis   Small to moderate pericardial effusion no evidence of tamponade  Hyperkalemia  End-stage renal disease on dialysis  Acute encephalopathy due to a combination of hypotension and effects of medications i.e. Neurontin and narcotics  Right abdominal walls abscess/cellulitis infection with MRSA (will be treated with a total of 10 days of doxycycline she has 6 more days to go)  Deconditioned  Obesity  Metabolic acidosis secondary to end-stage renal disease  Acute superficial venous thrombosis status post dialysis catheter  Recurrent mechanical falls  Normocytic anemia most likely secondary to anemia of chronic disease/CKD  Chronic orthostatic hypotension, on midodrine/florinef      Admission Condition: Guarded  Discharge Condition: Stable and improved  Consultants: Dr. Corky Young nephrology, Dr. Judge Young infectious diseases Dr. Amelia Jo [facility] cardiology, Dr.  Chase Young intensivist  Functional Status: Ambulatory  Discharged to: Skilled nursing facility    Discharge Medications:     Medication List      START taking these medications    acetaminophen 325 MG tablet  Commonly known as: TYLENOL  Take 2 tablets (650 mg total) by mouth every 6 (six) hours as needed for Pain or Fever     bisacodyl 5 MG EC tablet  Commonly known as: DULCOLAX  Take 1 tablet (5 mg total) by mouth daily as needed for Constipation     colchicine 0.6 MG tablet  Take 1 tablet (0.6 mg total) by mouth every 14 (fourteen) days Every 2 weeks next dose will be on 08/09/2019      darbepoetin alfa 60 MCG/0.3ML injection  Commonly known as: ARANESP  Inject 0.3 mLs (60 mcg total) into the skin once a week     doxycycline 100 MG capsule  Commonly known as: MONODOX  Take 1 capsule (100 mg total) by mouth every 12 (twelve) hours for 6 days Treat till 08/03/2019     fludrocortisone 0.1 MG tablet  Commonly known as: FLORINEF  Take 1 tablet (0.1 mg total) by mouth daily     melatonin 3 mg tablet  Take 1 tablet (3 mg total) by mouth nightly as needed for Sleep     midodrine 10 MG tablet  Commonly known as: PROAMATINE  Take 1 tablet (10 mg total) by mouth 3 (three) times daily with meals     ondansetron 4 MG disintegrating tablet  Commonly known as: ZOFRAN-ODT  Take 1 tablet (4 mg total) by mouth every 6 (six) hours as needed for Nausea     senna 8.6 MG Tabs  Commonly known as: SENOKOT  Take 1 tablet (8.6 mg total) by mouth nightly        CHANGE how you take these medications    insulin aspart 100 UNIT/ML injection  Commonly known as: NovoLOG  Inject 5 Units into the skin 3 (three) times daily before meals  What changed: how much to take     oxyCODONE 10 MG immediate release  tablet  Commonly known as: ROXICODONE  Take 1 tablet (10 mg total) by mouth every 3 (three) hours as needed for Pain  What changed:    medication strength   how much to take   when to take this     sodium hypochlorite 0.125 % Soln  Commonly known as: DAKIN'S QUARTER STRENGTH  Irrigate with as directed daily Dressing changes twice daily  What changed: additional instructions        CONTINUE taking these medications    atorvastatin 40 MG tablet  Commonly known as: LIPITOR     collagenase ointment  Commonly known as: SANTYL  Apply topically every 24 hours     famotidine 40 MG tablet  Commonly known as: PEPCID     lactobacillus/streptococcus Caps  Take 1 capsule by mouth daily     lidocaine 5 %  Commonly known as: LIDODERM  Place 1 patch onto the skin every 24 hours Remove & Discard patch within 12 hours or as directed by MD      nystatin powder  Commonly known as: NYSTOP  Apply 1 application topically 2 (two) times daily     pantoprazole 40 MG tablet  Commonly known as: PROTONIX  Take 1 tablet (40 mg total) by mouth 2 (two) times daily     sevelamer 800 MG tablet  Commonly known as: RENVELA     Trulicity 7.49 SW/9.6PR Sopn  Generic drug: Dulaglutide     vitamin C 500 MG tablet  Commonly known as: ASCORBIC ACID  Take 1 tablet (500 mg total) by mouth daily     zinc Oxide 40 % Pste paste  Commonly known as: DESITIN  Apply topically every 6 (six) hours     zinc sulfate 220 (50 Zn) MG capsule  Commonly known as: ZINCATE  Take 1 capsule (220 mg total) by mouth daily        STOP taking these medications    amLODIPine 5 MG tablet  Commonly known as: NORVASC     carvedilol 25 MG tablet  Commonly known as: COREG     ciprofloxacin 250 MG tablet  Commonly known as: CIPRO     gabapentin 300 MG capsule  Commonly known as: NEURONTIN     insulin glargine 100 UNIT/ML injection  Commonly known as: LANTUS           Where to Get Your Medications      These medications were sent to [facility] [address], [place] [address]    Phone: [phone]    fludrocortisone 0.1 MG tablet     You can get these medications from any pharmacy    Bring a paper prescription for each of these medications   oxyCODONE 10 MG immediate release tablet     Information about where to get these medications is not yet available    Ask your nurse or doctor about these medications   acetaminophen 325 MG tablet   bisacodyl 5 MG EC tablet   colchicine 0.6 MG tablet   darbepoetin alfa 60 MCG/0.3ML injection   doxycycline 100 MG capsule   melatonin 3 mg tablet   midodrine 10 MG tablet   ondansetron 4 MG disintegrating tablet   senna 8.6 MG Tabs   sodium hypochlorite 0.125 % [facility]   Presentation History   [name]  is a 64 year old African-American lady who has a  history of end-stage renal disease and was recently discharged to [facility] for PT OT and also for ongoing dialysis  She was brought back to the emergency room with a chief complaint of dizziness and chest pain.  She was admitted to the hospital and transferred to the ICU for episode of hypotension    See HPI for details.    Hospital Course (10 Days)   Patient was admitted to the ICU and was seen in consultation by Dr. Chase Young intensivist as well as Dr. Amelia Jo cardiology and echocardiogram was done which revealed evidence of a pericardial effusion but no evidence of tamponade  She was seen by nephrology Dr. Corky Young and associates and was initially treated with dialysis but no ultrafiltration because of hypotension  Patient was treated with crystalloids with improvement in her blood pressure readings  Broad-spectrum antibiotics were also added to the regimen as she had recently been treated for an abdominal wall infection  She was seen by Dr. Judge Young infectious diseases who assisted with antibiotic choice and duration of treatment  She improved with above measures and was transferred out of the ICU  She had a follow-up echocardiogram done which revealed no evidence of tamponade.  Antibiotics were continued and to repeat cultures came back positive for MRSA and her antibiotics were deescalated to oral doxycycline  The patient continued to recuperate on the telemetry unit however she was somewhat somnolent at some point and her gabapentin was felt to be the culprit and this was discontinued with improvement in her mentation  Patient has participated in physical therapy she has been transfused with 1 unit of packed RBCs for anemia as well as treated with hemodialysis and ultrafiltration and she is almost back to her baseline  She however requires ongoing PT OT and will be discharged to the [facility] skilled nursing facility for ongoing physical therapy  She will be followed by  the nephrology team  She will need to follow-up with [place] or her primary cardiologist in [place] to repeat an echocardiogram in about a week  She will continue doxycycline for the next 6 days to complete a course of antibiotics as recommended by infectious diseases  She also continues to complain of chest pain which was relieved by a dose of colchicine  Because of her end-stage renal disease and slow metabolism of colchicine in patients with end-stage renal disease as per guidelines she will she has received a dose of colchicine during the hospital stay and will need another dose of colchicine on August 08, 2020  She may be treated with another dose of colchicine 2 weeks thereafter  Further management will be based on follow-up echocardiogram findings      Prior to discharge, patient had syncopal episode.  Lightheadedness preceding syncope.  Orthostatics positive.  Already on maximal dose midodrine.  Started on florinef.  Prior ACTH stimulation test negative for adrenal insufficiency.  Neuro and cardiology cleared for discharge.  Discharged to SNF.    Prior to discharge, had dysfunction of tunneled catheter. Underwent catheter exchange with no complications.  Discharge to SNF after dialysis.         Best Practices   Was the patient admitted with either a CHF Exacerbation or Pneumonia?  No      Progress Note/Physical Exam at Discharge     Subjective: Patient feels well and ready for discharge    Vitals:    07/31/19 1600  07/31/19 1611 07/31/19 1615 07/31/19 1630   BP: 121/62 131/76 133/71 128/76   Pulse: 93 98 95 96   Resp: 18 18 19 16    Temp:       TempSrc:       SpO2: 96% 99% 96% 95%   Weight:       Height:               General: Obese lady in no acute distress AAOx3  HEENT: perrla, eomi, sclera anicteric, OP: Clear, MMM  Neck: supple, FROM, no LAD  Cardiovascular: RRR, no m/r/g  Lungs: CTAB, no w/r/r  Abdomen: soft, +BS, NT/ND, no masses, no g/r  Extremities: no C/C/E  Skin: no rashes or lesions  noted  Neuro: CN 2-12 intact; No Focal neurological deficits       Diagnostics     Labs/Studies Pending at Discharge: None    Last Labs   Recent Labs   Lab 07/26/19  0321 07/25/19  0358   WBC 9.53* 9.10   RBC 2.93* 2.92*   Hgb 8.1* 8.0*   Hematocrit 27.2* 27.3*   MCV 92.8 93.5   Platelets 240 211       Recent Labs   Lab 07/30/19  0415 07/26/19  0919   Sodium 137 138   Potassium 4.7 4.7   Chloride 102 100   CO2 18* 23   BUN 51.0* 68.0*   Creatinine 6.7* 7.8*   Glucose 164* 235*   Calcium 8.8 8.6   Magnesium 2.1  --        Microbiology Results     Procedure Component Value Units Date/Time    Blood Culture Aerobic/Anaerobic #1 [[id number]] Collected: 07/20/19 2052    Specimen: Arm from Blood, Venipuncture Updated: 07/26/19 0421    Narrative:      ORDER#: [id number]                                    ORDERED BY: Marcello Moores, SASHA  SOURCE: Blood, Venipuncture arm                      COLLECTED:  07/20/19 20:52  ANTIBIOTICS AT COLL.:                                RECEIVED :  07/20/19 21:25  Culture Blood Aerobic and Anaerobic        FINAL       07/26/19 04:21  07/26/19   No growth after 5 days of incubation.      Blood Culture Aerobic/Anaerobic #2 [[id number]] Collected: 07/20/19 2052    Specimen: Arm from Blood, Venipuncture Updated: 07/26/19 0421    Narrative:      ORDER#: [id number]                                    ORDERED BY: Randolm Idol  SOURCE: Blood, Venipuncture arm                      COLLECTED:  07/20/19 20:52  ANTIBIOTICS AT COLL.:                                RECEIVED :  07/20/19 21:25  Culture Blood Aerobic and Anaerobic        FINAL       07/26/19 04:21  07/26/19   No growth after 5 days of incubation.      COVID-19 (SARS-COV-2) [facility] [employer]) [[id number]] Collected: 07/21/19 0152    Specimen: Nasopharyngeal Swab from Nasopharynx Updated: 07/21/19 0324     Purpose of COVID testing Screening     SARS-CoV-2 Specimen Source Nasopharyngeal     SARS CoV 2 Overall Result Negative     Comment: Test performed using  the Abbott ID NOW EUA assay.  Please see Fact Sheets for patients and providers located at:  http://olson-hall.info/    This test is for the qualitative detection of SARS-CoV-2  (COVID19) nucleic acid. Viral nucleic acids may persist in vivo,  independent of viability. Detection of viral nucleic acid does  not imply the presence of infectious virus, or that virus  nucleic acid is the cause of clinical symptoms. Negative  results should be treated as presumptive and, if inconsistent  with clinical signs and symptoms or necessary for patient  management, should be tested with an alternative molecular  assay. Negative results do not preclude SARS-CoV-2 infection  and should not be used as the sole basis for patient  management decisions. Invalid results may be due to inhibiting  substances in the specimen and recollection should occur.         Narrative:      o Collect and clearly label specimen type:  o Upper respiratory specimen: One Nasopharyngeal Dry Swab NO  Transport Media.  o Hand deliver to laboratory ASAP  Indication for testing->Extended care facility admission to  semi private room  Screening    Culture + Gram Stain,Aerobic, Wound [[id number]] Collected: 07/22/19 1033    Specimen: Wound Updated: 07/24/19 0707    Narrative:      culturette  ORDER#: [id number]                                    ORDERED BY: Lahoma Crocker, ERIC  SOURCE: Wound abdominal wall                         COLLECTED:  07/22/19 10:33  ANTIBIOTICS AT COLL.:                                RECEIVED :  07/22/19 13:37  ORDER ENTRY COMMENTS:  culturette  Stain, Gram                                FINAL       07/22/19 15:20  07/22/19   Few WBCs             No Squamous epithelial cells seen             No organisms seen  Culture and Gram Stain, Aerobic, Wound     FINAL       07/24/19 07:07   +  07/24/19   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            Previous quantitation was Light growth of, verified by [id number] at 12:43 on  07/23/19  _____________________________________________________________________________  MRSA        ANTIBIOTICS                     MIC  INTRP      _____________________________________________________________________________  Clindamycin                    <=0.5   S        Daptomycin                      <=1    S        Erythromycin                    >4     R        Gentamicin                      <=2    S        Levofloxacin                     4     I        Linezolid                        2     S        Oxacillin                       >2     R  D1    Penicillin                      >1     R        Rifampin                       <=0.5   S        Tetracycline                   <=0.5   S        Trimethoprim/Sulfamethoxazole <=0.5/9  S        Vancomycin                       1     S          -----DRUG COMMENTS----------    D1:  Oxacillin sensitivity predicts sensitivity to cephalosporins.         [employer] 2016  _____________________________________________________________________________            S=SUSCEPTIBLE     I=INTERMEDIATE     R=RESISTANT                            N/S=NON-SUSCEPTIBLE  _____________________________________________________________________________      MRSA culture - Nares [[id number]] Collected: 07/21/19 1058    Specimen: Culturette from Nares Updated: 07/22/19 1117     Culture MRSA Surveillance Negative for Methicillin Resistant Staph aureus    MRSA culture - Throat [[id number]] Collected: 07/21/19 1058    Specimen: Culturette from Throat Updated: 07/22/19 1117     Culture MRSA Surveillance Negative for Methicillin Resistant Staph aureus           Patient Instructions   Discharge Diet: Consistent carbohydrate cardiac diet  Discharge Activity: As tolerated  Follow Up Appointment:  Follow-up Information     Nevin Bloodgood, MD Follow up in 2 day(s).    Specialty: Nephrology  Contact information:  [address]  [address]  [address] [address]  [phone]             Tarri Glenn, MD Follow up in 1 week(s).    Specialty: Cardiology  Why: Follow-up echocardiogram to evaluate for pericardial fluid  Contact information:  [address]  [address]  [place] [address]  [phone]             Pcp, None, MD .                  Time spent examining patient, discussing with patient/family regarding hospital course, chart review, reconciling medications and discharge planning:    35   minutes.    Signed,  Forest Becker    4:38 PM 07/31/2019

## 2019-07-31 NOTE — Progress Notes (Signed)
Pt discharged at 2110 to [facility], room 514. Report was given to receiving nurse, Kathlen Brunswick. Pt belongings and discharge packet was given to PTS transporters.

## 2019-07-31 NOTE — Progress Notes (Signed)
Pt is contact isolation,  PPE done per protocol.  Pt alert and oriented.  Pt denied pain and sob.  R permacath access was placed today by [facility],  access is optimally working. Timeouts and safety checks done. Will closely monitor the pt.       07/31/19 1420   Bedside Nurse Communication   Name of bedside RN - pre dialysis Odis Luster, RN   Treatment Initiation- With Dialysis Precautions   Time Out/Safety Check Completed Yes   Consent for HD signed for this hospitalization (Date) 05/23/19   Consent for HD signed for this hospitalization (Time) 1044   Dialysis Precautions All Connections Secured;Saline Line Double Clamped;Venous Parameters Set;Arterial Parameters Set;Air Foam Detecctor Engaged   Dialysis Treatment Type Routine;Bedside   Special Considerations Contact isolation   Is patient diabetic? Yes   RO/Hemodialysis Architectural technologist   Is Total Chlorine less than 0.1 ppm? Yes   Orignial Total Chlorine Testing Time 1320   At 4 Hour Total Chlorine Testing Time 1720   RO/Hemodialysis Facilities manager Number 7    Machine Serial Number [id number]   RO # 27   RO Serial # [id number]   pH 7.2   Pressure Test Verified Yes   Alarms Verified Passed   Machine Temperature 98.6 F (37 C)   Alarms Verified Yes   Na+ mEq (Machine) 138 mEq   Bicarb mEq (Machine) 35 mEq   Hemodialysis Conductivity (Machine) 13.8   Hemodialysis Conductivity (Meter) 13.6   Dialyzer Lot Number [id number]   Tubing Lot Number [id number]   RO Machine Log Completed Yes   Hepatitis Status   HBsAg (Antigen) Result Negative   HBsAg Date Drawn 07/10/19   HBsAg Repeat Draw Due Date 02/07/19   Dialysis Weight   Pre-Treatment Weight (Kg) 106.1   Scale Type ICU Bed Scale   Vitals   Temp 97.8 F (36.6 C)   Heart Rate 89   Resp Rate 18   BP 157/75   SpO2 93 %   O2 Device None (Room air)   Assessment   Mental Status Alert;Oriented   Respiratory Pattern Regular   Bilateral Breath Sounds Clear;Diminished   R Breath Sounds Clear;Diminished   L Breath  Sounds Clear;Diminished   RLE Edema Non Pitting Edema   LLE Edema Non Pitting Edema   General Skin Color Appropriate for ethnicity   Skin Condition/Temp Warm;Dry   Abdomen Inspection Soft   GI Symptoms Loss of appetite   Mobility Bed   Permacath Catheter - Tunneled 07/31/19 Internal Jugular Right   Placement Date/Time: 07/31/19 1141   Inserted by: Dr Stevie Kern  Access Type: (c) Internal Jugular  Orientation: Right  Central Line Infection Prevention Education provided?: Yes  Hand Hygiene: Soap and water;Alcohol based hand scrub  Line cart used?:...   Line necessity reviewed? Apheresis/hemodialysis   Catheter Lumen Volume Venous 1.7 mL   Catheter Lumen Volume Arterial 1.7 mL   Dressing Status and Intervention Dressing Intact;Clean & Dry   Tego/Curos Caps on Catheter Yes   NEW Tego/Curos Caps placed (Date) 07/31/19   End Caps Free From Blood Yes   Line Care Connections checked and tightened   Dressing Type Transparent;Biopatch   Dressing Status Clean;Dry;Intact   Line Used For Blood Draw No   Dressing Change Due 08/06/19   Pain Assessment   Charting Type Assessment   Pain Scale Used Numeric Scale (0-10)   Numeric Pain Scale   Pain Score 0   POSS Score 1  Hemodialysis Comments   Pre-Hemodialysis Comments Timeout and safety checks done.

## 2019-07-31 NOTE — Progress Notes (Signed)
Patient discharge to SNF today, [place] room 514, nursing report [phone] ex. [phone]. Patient in agreable to the discharge plan and orders. Transportation is set for PTS Stretcher, Pick time 7PM.      07/31/19 1632   Discharge Disposition   Patient preference/choice provided? Yes   Physical Discharge Disposition SNF   Receiving facility, unit and room number:   [facility] room 514)   Nursing report phone number:   [phone] ex [phone])   Facility fax number:   [phone])   Mode of Transportation Other (comment)  (PTS Stretcher)   Pick up time   (7PM)   Patient/Family/POA notified of transfer plan Patient informed only   Patient agreeable to discharge plan/expected d/c date? Yes   Bedside nurse notified of transport plan? Yes   CM Interventions   Follow up appointment scheduled? No   Reason no follow up scheduled? Discharge to SNF/AR/LTAC   Notified MD? Yes   Multidisciplinary rounds/family meeting before d/c? Yes   Medicare Checklist   Is this a Medicare patient? No   Patient received 1st IMM Letter? No

## 2019-07-31 NOTE — Plan of Care (Signed)
Patient alert complain of pain , pain medication given per order, Vital sign stable wound care done, safety precaution in place, patient encouraged to call for assistance, continue to monitor the patient.   Problem: Safety  Goal: Patient will be free from injury during hospitalization  Outcome: Progressing  Flowsheets (Taken 07/30/2019 1835 by Kipp Brood, RN)  Patient will be free from injury during hospitalization:   Assess patient's risk for falls and implement fall prevention plan of care per policy   Use appropriate transfer methods   Provide and maintain safe environment   Ensure appropriate safety devices are available at the bedside   Hourly rounding   Include patient/ family/ care giver in decisions related to safety     Problem: Pain  Goal: Pain at adequate level as identified by patient  Outcome: Progressing  Flowsheets (Taken 07/30/2019 1835 by Kipp Brood, RN)  Pain at adequate level as identified by patient:   Identify patient comfort function goal   Assess pain on admission, during daily assessment and/or before any "as needed" intervention(s)   Reassess pain within 30-60 minutes of any procedure/intervention, per Pain Assessment, Intervention, Reassessment (AIR) Cycle   Evaluate patient's satisfaction with pain management progress   Evaluate if patient comfort function goal is met   Offer non-pharmacological pain management interventions   Include patient/patient care companion in decisions related to pain management as needed     Problem: Compromised Tissue integrity  Goal: Damaged tissue is healing and protected  Flowsheets (Taken 07/26/2019 0122 by Shane Crutch, RN)  Damaged tissue is healing and protected:   Monitor/assess Braden scale every shift   Provide wound care per wound care algorithm     Problem: Renal Instability  Goal: Fluid and electrolyte balance are achieved/maintained  Outcome: Progressing  Flowsheets (Taken 07/30/2019 1640 by Balacuit, Carmelino, RN)  Fluid and  electrolyte balance are achieved/maintained:   Monitor/assess lab values and report abnormal values   Assess and reassess fluid and electrolyte status   Observe for cardiac arrhythmias

## 2019-07-31 NOTE — H&P (Signed)
[facility]  [facility]   History and Physical     History and Physical:     The patient was interviewed and examined in the preoperative holding area. The previously documented history and physical on 07/21/19 was reviewed in detail and there are no significant changes on exam. Patient LUQ dressing from previous PD cath site CDI. Patient has no other complaints.    Discussion was held with the patient at bedside regarding her diagnosis and procedural treatment options.  This discussion included risks, benefits and alternative treatment options. Following the discussion, all of her questions have been answered and she wishes to proceed with the recommended treatment.    ASA Classification:   []    ASA 1  Healthy patient  []    ASA 2  Mild systemic illness  [x]    ASA 3  Systemic disease, though not incapacitating  []    ASA 4  Severe systemic disease that is a constant threat to life   []    ASA 5  Moribund condition, patient unexpected to live >24 hours, irrespective of procedure  []    E         Emergent procedure    Mallampati Score:   []  1  []  2  [x]  3  []  4  []  Intubated  []  Tracheostomy    Planned Anesthesia:   []  No sedation  [x]  Local  [x]  Moderate sedation  []  Deep sedation (with Anesthesiology present)  []  General Anesthesia     Airway Assesment:   [x]  Normal  [] Compromised    Diagnosis: Malfunctioning tunneled hemodialysis catheter  Procedure: Tunneled hemodialysis catheter replacement    The patient is medically stable and appropriate to undergo the planned procedure. All risks and alternatives were discussed with the patient and she agrees to proceed.     Signed by: Denzil Magnuson, NP  [facility] Department  [phone]    [name]

## 2019-07-31 NOTE — Progress Notes (Signed)
[facility] PROGRESS NOTE  Aaron Edelman, x [phone] ([place])      Date Time: 07/31/19 6:46 AM  Patient Name: [name]  Attending Physician: Forest Becker, MD    CC: follow-up ESRD, provision of hemodialysis    Assessment:     1. ESRD on HD MWF at [facility], Dr. Aline August  2. Pericardial effusion  3. Hypotension - on midodrine   - neg stim test  4. Anemia in CKD  5. Mild metabolic acidosis - resolved    Recommendations:     Permcath today  Next HD today  Stable for discharge from renal perspective after dialysis today        Case discussed with: patient, HD RN          Armando Reichert, MD  [facility]  [phone] (office)  X [phone] ([facility])      Subjective: Dialysis canceled yesterday as catheter not working    Review of Systems:   Cardio: chest pain, no edema  Pulm: no SOB  GI: no abdominal pain  MSK: right hand pain - improving    Physical Exam:     Vitals:    07/30/19 2331 07/30/19 2332 07/31/19 0311 07/31/19 0634   BP: 150/74 135/66 151/79 156/68   Pulse: 74 78 79 85   Resp: 18  16 19    Temp: 98.1 F (36.7 C)  97.9 F (36.6 C) 98.2 F (36.8 C)   TempSrc: Oral  Oral Oral   SpO2: 91% 91% 93% 93%   Weight:       Height:           Intake and Output Summary (Last 24 hours) at Date Time    Intake/Output Summary (Last 24 hours) at 07/31/2019 0646  Last data filed at 07/30/2019 2000  Gross per 24 hour   Intake 60 ml   Output -75 ml   Net 135 ml       General: awake, alert, oriented x 3, no acute distress.  Cardiovascular: regular rate and rhythm  Lungs: clear to auscultation bilaterally, bilateral air entry, normal work of breathing  Abdomen: soft, NT, ND  Extremities: no edema    Access:  R IJ HDTC    Meds:      Scheduled Meds: PRN Meds:    atorvastatin, 40 mg, Oral, Daily  collagenase, , Topical, Q24H SCH  darbepoetin alfa, 60 mcg, Subcutaneous, Weekly  doxycycline, 100 mg, Oral, Q12H SCH  fludrocortisone, 0.1 mg, Oral, Daily  insulin lispro, 1-6 Units,  Subcutaneous, QHS  insulin lispro, 2-10 Units, Subcutaneous, TID AC  lactobacillus/streptococcus, 1 capsule, Oral, Daily  lidocaine, 1 patch, Transdermal, Q24H SCH  midodrine, 10 mg, Oral, TID MEALS  pantoprazole, 40 mg, Oral, QAM AC  senna, 8.6 mg, Oral, QHS  sevelamer, 800 mg, Oral, TID MEALS  sodium chloride, 250 mL, Intravenous, Once  sodium hypochlorite, , Irrigation, Q12H          Continuous Infusions:   sodium chloride, , PRN  sodium chloride, , PRN  acetaminophen, 650 mg, Q6H PRN   Or  acetaminophen, 650 mg, Q6H PRN  bisacodyl, 5 mg, Daily PRN  dextrose, 15 g of glucose, PRN   And  dextrose, 12.5 g, PRN   And  glucagon (rDNA), 1 mg, PRN  lactulose, 20 g, BID PRN  LORazepam, 1 mg, Q4H PRN  melatonin, 3 mg, QHS PRN  naloxone, 0.2 mg, PRN  ondansetron, 4 mg, Q6H PRN   Or  ondansetron, 4 mg,  Q6H PRN  oxyCODONE, 10 mg, Q3H PRN              Labs:     Recent Labs   Lab 07/26/19  0321 07/25/19  0358   WBC 9.53* 9.10   Hgb 8.1* 8.0*   Hematocrit 27.2* 27.3*   Platelets 240 211     Recent Labs   Lab 07/30/19  0415 07/26/19  0919   Sodium 137 138   Potassium 4.7 4.7   Chloride 102 100   CO2 18* 23   BUN 51.0* 68.0*   Creatinine 6.7* 7.8*   Calcium 8.8 8.6   Albumin 3.3*  --    Phosphorus 4.9*  --    Magnesium 2.1  --    Glucose 164* 235*   EGFR 7.5 6.3     : No results found.        Signed by: Armando Reichert, MD, FASN

## 2019-07-31 NOTE — Plan of Care (Signed)
Problem: Renal Instability  Goal: Fluid and electrolyte balance are achieved/maintained  Outcome: Progressing  Flowsheets (Taken 07/31/2019 1620)  Fluid and electrolyte balance are achieved/maintained:   Monitor daily weight   Monitor/assess lab values and report abnormal values   Assess for confusion/personality changes  Goal: Nutritional intake is adequate  Outcome: Progressing  Flowsheets (Taken 07/31/2019 1620)  Nutritional intake is adequate:   Assist patient with meals/food selection   Monitor daily weights     Problem: Patient Receiving Advanced Renal Therapies  Goal: Therapy access site remains intact  Outcome: Progressing  Flowsheets (Taken 07/31/2019 1620)  Therapy access site remains intact:   Assess therapy access site   Change therapy access site dressing as needed

## 2019-07-31 NOTE — Plan of Care (Addendum)
Problem: Safety  Goal: Patient will be free from injury during hospitalization  07/31/2019 0743 by Odis Luster, RN  Outcome: Progressing  Flowsheets (Taken 07/31/2019 [phone])  Patient will be free from injury during hospitalization:   Ensure appropriate safety devices are available at the bedside   Include patient/ family/ care giver in decisions related to safety   Use appropriate transfer methods   Hourly rounding   Provide and maintain safe environment   Assess patient's risk for falls and implement fall prevention plan of care per policy  09/25/4266 3419 by Odis Luster, RN  Outcome: Progressing  Goal: Patient will be free from infection during hospitalization  07/31/2019 0743 by Odis Luster, RN  Outcome: Progressing  Flowsheets (Taken 07/31/2019 0743)  Free from Infection during hospitalization:   Assess and monitor for signs and symptoms of infection   Monitor all insertion sites (i.e. indwelling lines, tubes, urinary catheters, and drains)   Monitor lab/diagnostic results   Encourage patient and family to use good hand hygiene technique  07/31/2019 0743 by Odis Luster, RN  Outcome: Progressing     Problem: Pain  Goal: Pain at adequate level as identified by patient  07/31/2019 0743 by Odis Luster, RN  Outcome: Progressing  Flowsheets (Taken 07/31/2019 0743)  Pain at adequate level as identified by patient:   Assess for risk of opioid induced respiratory depression, including snoring/sleep apnea. Alert healthcare team of risk factors identified.   Identify patient comfort function goal   Assess pain on admission, during daily assessment and/or before any "as needed" intervention(s)   Reassess pain within 30-60 minutes of any procedure/intervention, per Pain Assessment, Intervention, Reassessment (AIR) Cycle   Evaluate if patient comfort function goal is met   Evaluate patient's satisfaction with pain management progress   Consult/collaborate with Pain Service   Offer non-pharmacological pain  management interventions  07/31/2019 0743 by Odis Luster, RN  Outcome: Progressing     Problem: Compromised Tissue integrity  Goal: Damaged tissue is healing and protected  07/31/2019 0743 by Odis Luster, RN  Outcome: Progressing  Flowsheets (Taken 07/31/2019 [phone])  Damaged tissue is healing and protected:   Monitor/assess Braden scale every shift   Reposition patient every 2 hours and as needed unless able to reposition self   Provide wound care per wound care algorithm   Increase activity as tolerated/progressive mobility   Avoid shearing injuries   Keep intact skin clean and dry   Relieve pressure to bony prominences for patients at moderate and high risk   Use bath wipes, not soap and water, for daily bathing   Monitor external devices/tubes for correct placement to prevent pressure, friction and shearing   Use incontinence wipes for cleaning urine, stool and caustic drainage. Foley care as needed   Encourage use of lotion/moisturizer on skin  07/31/2019 0743 by Odis Luster, RN  Outcome: Progressing  Goal: Nutritional status is improving  07/31/2019 0743 by Odis Luster, RN  Outcome: Progressing  Flowsheets (Taken 07/31/2019 [phone])  Nutritional status is improving:   Assist patient with eating   Include patient/patient care companion in decisions related to nutrition   Allow adequate time for meals   Encourage patient to take dietary supplement(s) as ordered   Collaborate with Clinical Nutritionist  07/31/2019 0743 by Odis Luster, RN  Outcome: Progressing     Problem: Renal Instability  Goal: Fluid and electrolyte balance are achieved/maintained  07/31/2019 0743 by Odis Luster, RN  Outcome: Progressing  Flowsheets (Taken 07/31/2019 0743)  Fluid and electrolyte  balance are achieved/maintained:   Monitor intake and output every shift   Assess for confusion/personality changes   Monitor/assess lab values and report abnormal values   Assess and reassess fluid and electrolyte status   Provide adequate  hydration   Monitor daily weight   Monitor for muscle weakness   Observe for cardiac arrhythmias   Observe for seizure activity and initiate seizure precautions if indicated  07/31/2019 0743 by Odis Luster, RN  Outcome: Progressing     Problem: Patient Receiving Advanced Renal Therapies  Goal: Therapy access site remains intact  07/31/2019 0743 by Odis Luster, RN  Outcome: Progressing  Flowsheets (Taken 07/31/2019 [phone])  Therapy access site remains intact:   Assess therapy access site   Change therapy access site dressing as needed  07/31/2019 0743 by Odis Luster, RN  Outcome: Progressing     Problem: Diabetes: Glucose Imbalance  Goal: Blood glucose stable at established goal  Outcome: Progressing  Flowsheets (Taken 07/31/2019 0743)  Blood glucose stable at established goal:   Monitor lab values   Include patient/family in decisions related to nutrition/dietary selections   Monitor intake and output.  Notify LIP if urine output is < 30 mL/hour.   Assess for hypoglycemia /hyperglycemia   Follow fluid restrictions/IV/PO parameters   Monitor/assess vital signs   Ensure adequate hydration   Ensure appropriate diet and assess tolerance   Coordinate medication administration with meals, as indicated     S/p perma cath replacement on R chest. Dressing C/D/I. Dialysis today - 2.5 L removed. VSS. No acute distress noted. Monitored BG. Wound care provided to abdominal wound. Reinforced safety measures.

## 2019-07-31 NOTE — Progress Notes (Signed)
Pt is contact isolation,  PPE done per protocol.  Pt alert and oriented. Pt denied pain and sob. No other complain given. R Permacath access is optimally working post dialysis. Catheter dressing clean, dry and intact.  Hemodialysis ended and pt. able to finish 3.5 hrs tx.  Able to removed Net Fluid of 2.5L as ordered.  Reports were given to Primary nurse.       07/31/19 1810   Treatment Summary   Time Off Machine 1800   Duration of Treatment (Hours) 3.5   Treatment Type 1:1   Dialyzer Clearance Moderately streaked   Fluid Volume Off (mL) 2900   Prime Volume (mL) 200   Rinseback Volume (mL) 200   Fluid Given: Normal Saline (mL) 0   Fluid Given: PRBC  0 mL   Fluid Given: Albumin (mL) 0   Fluid Given: Other (mL) 0   Total Fluid Given 400   Hemodialysis Net Fluid Removed 2500   Post Treatment Assessment   Post-Treatment Weight (Kg) 103.6   Patient Response to Treatment fairly tolerated   Additional Dialyzer Used 0   Permacath Catheter - Tunneled 07/31/19 Internal Jugular Right   Placement Date/Time: 07/31/19 1141   Inserted by: Dr Stevie Kern  Access Type: (c) Internal Jugular  Orientation: Right  Central Line Infection Prevention Education provided?: Yes  Hand Hygiene: Soap and water;Alcohol based hand scrub  Line cart used?:...   Line necessity reviewed? Apheresis/hemodialysis   Catheter Lumen Volume Venous 1.7 mL   Catheter Lumen Volume Arterial 1.7 mL   Dressing Status and Intervention Dressing Intact;Clean & Dry   Tego/Curos Caps on Catheter Yes   NEW Tego/Curos Caps placed (Date) 07/31/19   End Caps Free From Blood Yes   Line Care Connections checked and tightened   Dressing Type Transparent;Biopatch   Dressing Status Clean;Dry;Intact   Line Used For Blood Draw No   Dressing Change Due 08/06/19   Vitals   Temp 98.7 F (37.1 C)   Heart Rate 95   Resp Rate 18   BP 130/75   SpO2 95 %   O2 Device None (Room air)   Assessment   Mental Status Alert;Oriented;Cooperative   Respiratory Pattern Regular   Bilateral Breath  Sounds Clear;Diminished   R Breath Sounds Clear;Diminished   L Breath Sounds Clear;Diminished   RLE Edema Non Pitting Edema   LLE Edema Non Pitting Edema   General Skin Color Appropriate for ethnicity   Skin Condition/Temp Warm;Dry   Abdomen Inspection Soft   GI Symptoms Loss of appetite   Mobility Bed   Pain Assessment   Charting Type Assessment   Pain Scale Used Numeric Scale (0-10)   Numeric Pain Scale   Pain Score 0   POSS Score 1   Education   Person taught Patient   Knowledge basis Substantial   Topics taught Access care;S&S of infection;Fluid Management;K+;Procedure   Teaching Tools Explain   Reponse Verbalizes Understanding   Bedside Nurse Communication   Name of bedside RN - post dialysis Odis Luster, RN

## 2019-08-01 ENCOUNTER — Other Ambulatory Visit (INDEPENDENT_AMBULATORY_CARE_PROVIDER_SITE_OTHER): Payer: Self-pay | Admitting: Cardiovascular Disease

## 2019-08-04 ENCOUNTER — Encounter: Payer: Self-pay | Admitting: Interventional Radiology and Diagnostic Radiology

## 2019-08-04 ENCOUNTER — Encounter (HOSPITAL_COMMUNITY): Payer: No Typology Code available for payment source

## 2019-08-05 DIAGNOSIS — R079 Chest pain, unspecified: Secondary | ICD-10-CM

## 2019-08-06 DIAGNOSIS — I313 Pericardial effusion (noninflammatory): Secondary | ICD-10-CM

## 2019-08-11 NOTE — Retrospective Coding Query (Signed)
PHYSICIAN'S DOCUMENTATION                                                                      REQUEST                                                                         Date of Request:  08/11/2019  Type of Request:  DOCUMENTATION CLARIFICATION                                         Patient Name: [name], [name]  Account #: [id number]  MR #: [id number]  Discharge Date: 07/31/2019      Dear Dr. Loree Fee     The medical record reflects the following      This is a 64 year old female who was discharged 1 week ago the being treated for abdominal wall abscess with MRSA Pseudomonas and Enterococcus.  She also had a periinguinal yeast infection.  She is now readmitted for hypotension and possible sepsis.  She was admitted initially to the floor for hypotension dizziness chest pain or shortness of breath.  PE was ruled out.  She developed hypotension and hyperkalemia and was transferred to the ICU.  Her blood pressure does improve with fluid boluses.  She has been started empirically on vancomycin and meropenem     6/28 PN - possible pneumonia     6/30 - 7/5 Medicine PN - pneumonia - clinically improved        The discharge summary - Sob - pericardial effusion with documented acute pericarditis.   The pneumonia is not listed in the discharge summary     Important Note: In the inpatient setting, you may document possible, probable, suspected diagnoses that you have not ruled out at the point of discharge. Any conditions ruled out during the hospital stay will not be coded. Documentation should include the medical decision-making process and other clinical information supporting the suspected condition.         Question to Physician:     ____ SOB - pericardial effusion with acute pericarditis without pneumonia     ____  Pneumonia ruled in with pericarditis      ___x_ Other (please specify)     ____ Unknown/Unable to determine              PHYSICIAN RESPONSE:          Patient had fluid overload, pericarditis but no evidence of pneumonia                 Coder  Delilah Shan  Date     08/08/2019

## 2019-08-18 ENCOUNTER — Inpatient Hospital Stay (INDEPENDENT_AMBULATORY_CARE_PROVIDER_SITE_OTHER): Payer: Medicaid Other | Admitting: Nurse Practitioner

## 2019-09-16 NOTE — Progress Notes (Signed)
Multiple calls received today from [place], [place] regarding patient's eligibility for [facility] and [facility].    Review of the [place] [employer].  Successfully Processed [facility] located.  Return call to [place], [phone] to inform.  CM will fax the document to [phone].      Memory Argue, MSW  Social Worker Case Manager [facility]  [phone]

## 2019-09-18 NOTE — Progress Notes (Signed)
Late Entry: [facility] #[id number] re-faxed to Judieth Keens [facility], to [phone], 09/17/2019.      Memory Argue, MSW  Social Worker Case Manager [facility]  [phone]

## 2019-10-03 ENCOUNTER — Encounter: Payer: Self-pay | Admitting: Certified Registered"

## 2019-12-31 ENCOUNTER — Emergency Department: Payer: 59

## 2019-12-31 ENCOUNTER — Inpatient Hospital Stay
Admission: EM | Admit: 2019-12-31 | Discharge: 2020-01-08 | DRG: 137 | Disposition: A | Discharge status: Home Health | Payer: 59 | Attending: Geriatric Medicine | Admitting: Geriatric Medicine

## 2019-12-31 DIAGNOSIS — Z8249 Family history of ischemic heart disease and other diseases of the circulatory system: Secondary | ICD-10-CM

## 2019-12-31 DIAGNOSIS — E119 Type 2 diabetes mellitus without complications: Secondary | ICD-10-CM

## 2019-12-31 DIAGNOSIS — J9601 Acute respiratory failure with hypoxia: Secondary | ICD-10-CM | POA: Diagnosis present

## 2019-12-31 DIAGNOSIS — E1122 Type 2 diabetes mellitus with diabetic chronic kidney disease: Secondary | ICD-10-CM | POA: Diagnosis present

## 2019-12-31 DIAGNOSIS — R0602 Shortness of breath: Secondary | ICD-10-CM

## 2019-12-31 DIAGNOSIS — I160 Hypertensive urgency: Secondary | ICD-10-CM | POA: Diagnosis present

## 2019-12-31 DIAGNOSIS — R778 Other specified abnormalities of plasma proteins: Secondary | ICD-10-CM

## 2019-12-31 DIAGNOSIS — D508 Other iron deficiency anemias: Secondary | ICD-10-CM | POA: Diagnosis present

## 2019-12-31 DIAGNOSIS — J440 Chronic obstructive pulmonary disease with acute lower respiratory infection: Secondary | ICD-10-CM | POA: Diagnosis present

## 2019-12-31 DIAGNOSIS — D631 Anemia in chronic kidney disease: Secondary | ICD-10-CM | POA: Diagnosis present

## 2019-12-31 DIAGNOSIS — I248 Other forms of acute ischemic heart disease: Secondary | ICD-10-CM | POA: Diagnosis present

## 2019-12-31 DIAGNOSIS — I120 Hypertensive chronic kidney disease with stage 5 chronic kidney disease or end stage renal disease: Secondary | ICD-10-CM | POA: Diagnosis present

## 2019-12-31 DIAGNOSIS — Z794 Long term (current) use of insulin: Secondary | ICD-10-CM

## 2019-12-31 DIAGNOSIS — A419 Sepsis, unspecified organism: Secondary | ICD-10-CM

## 2019-12-31 DIAGNOSIS — J1282 Pneumonia due to coronavirus disease 2019: Secondary | ICD-10-CM | POA: Diagnosis present

## 2019-12-31 DIAGNOSIS — E785 Hyperlipidemia, unspecified: Secondary | ICD-10-CM | POA: Diagnosis present

## 2019-12-31 DIAGNOSIS — Z96612 Presence of left artificial shoulder joint: Secondary | ICD-10-CM | POA: Diagnosis present

## 2019-12-31 DIAGNOSIS — A0472 Enterocolitis due to Clostridium difficile, not specified as recurrent: Secondary | ICD-10-CM | POA: Diagnosis present

## 2019-12-31 DIAGNOSIS — R197 Diarrhea, unspecified: Secondary | ICD-10-CM | POA: Diagnosis present

## 2019-12-31 DIAGNOSIS — Z96651 Presence of right artificial knee joint: Secondary | ICD-10-CM | POA: Diagnosis present

## 2019-12-31 DIAGNOSIS — Z79899 Other long term (current) drug therapy: Secondary | ICD-10-CM

## 2019-12-31 DIAGNOSIS — Z6839 Body mass index (BMI) 39.0-39.9, adult: Secondary | ICD-10-CM

## 2019-12-31 DIAGNOSIS — Z992 Dependence on renal dialysis: Secondary | ICD-10-CM

## 2019-12-31 DIAGNOSIS — I951 Orthostatic hypotension: Secondary | ICD-10-CM | POA: Insufficient documentation

## 2019-12-31 DIAGNOSIS — K219 Gastro-esophageal reflux disease without esophagitis: Secondary | ICD-10-CM | POA: Diagnosis present

## 2019-12-31 DIAGNOSIS — I10 Essential (primary) hypertension: Secondary | ICD-10-CM | POA: Diagnosis present

## 2019-12-31 DIAGNOSIS — Z833 Family history of diabetes mellitus: Secondary | ICD-10-CM

## 2019-12-31 DIAGNOSIS — K6289 Other specified diseases of anus and rectum: Secondary | ICD-10-CM | POA: Diagnosis present

## 2019-12-31 DIAGNOSIS — N186 End stage renal disease: Secondary | ICD-10-CM | POA: Diagnosis present

## 2019-12-31 DIAGNOSIS — Z8709 Personal history of other diseases of the respiratory system: Secondary | ICD-10-CM

## 2019-12-31 DIAGNOSIS — U071 COVID-19: Principal | ICD-10-CM | POA: Diagnosis present

## 2019-12-31 DIAGNOSIS — Z8673 Personal history of transient ischemic attack (TIA), and cerebral infarction without residual deficits: Secondary | ICD-10-CM

## 2019-12-31 LAB — BLOOD GAS, ARTERIAL
Arterial Total CO2: 58.5 mEq/L — ABNORMAL HIGH (ref 24.0–30.0)
Base Excess, Arterial: 4.6 mEq/L — ABNORMAL HIGH (ref <=2.0)
ETCO2: 0
FIO2: 40 %
HCO3, Arterial: 28 mEq/L (ref 23.0–29.0)
Minute Ventilation: 0 L/min
O2 Flow: 5 L/min
O2 Sat, Arterial: 99 % (ref 95.0–100.0)
PEEP: 0
Pressure Control: 0
Pressure Support: 0
Rate: 18 {beats}/min
Temperature: 37
pCO2, Arterial: 37.9 mmHg (ref 35.0–45.0)
pH, Arterial: 7.481 — ABNORMAL HIGH (ref 7.350–7.450)
pO2, Arterial: 125 mmHg — ABNORMAL HIGH (ref 80.0–90.0)

## 2019-12-31 LAB — COVID-19 (SARS-COV-2) & INFLUENZA  A/B, NAA (ROCHE LIAT)
Influenza A: NOT DETECTED
Influenza B: NOT DETECTED
SARS CoV 2 Overall Result: DETECTED — AB

## 2019-12-31 LAB — COMPREHENSIVE METABOLIC PANEL
ALT: 10 U/L (ref 0–55)
AST (SGOT): 20 U/L (ref 5–34)
Albumin/Globulin Ratio: 0.6 — ABNORMAL LOW (ref 0.9–2.2)
Albumin: 2.7 g/dL — ABNORMAL LOW (ref 3.5–5.0)
Alkaline Phosphatase: 121 U/L — ABNORMAL HIGH (ref 37–106)
Anion Gap: 19 — ABNORMAL HIGH (ref 5.0–15.0)
BUN: 33 mg/dL — ABNORMAL HIGH (ref 7.0–19.0)
Bilirubin, Total: 0.4 mg/dL (ref 0.2–1.2)
CO2: 25 mEq/L (ref 22–29)
Calcium: 7.9 mg/dL — ABNORMAL LOW (ref 8.5–10.5)
Chloride: 92 mEq/L — ABNORMAL LOW (ref 100–111)
Creatinine: 5.4 mg/dL — ABNORMAL HIGH (ref 0.6–1.0)
Globulin: 4.6 g/dL — ABNORMAL HIGH (ref 2.0–3.6)
Glucose: 265 mg/dL — ABNORMAL HIGH (ref 70–100)
Potassium: 3.7 mEq/L (ref 3.5–5.1)
Protein, Total: 7.3 g/dL (ref 6.0–8.3)
Sodium: 136 mEq/L (ref 136–145)

## 2019-12-31 LAB — CBC AND DIFFERENTIAL
Absolute NRBC: 0 10*3/uL (ref 0.00–0.00)
Basophils Absolute Automated: 0.01 10*3/uL (ref 0.00–0.08)
Basophils Automated: 0.2 %
Eosinophils Absolute Automated: 0.02 10*3/uL (ref 0.00–0.44)
Eosinophils Automated: 0.4 %
Hematocrit: 29.9 % — ABNORMAL LOW (ref 34.7–43.7)
Hgb: 9 g/dL — ABNORMAL LOW (ref 11.4–14.8)
Immature Granulocytes Absolute: 0.07 10*3/uL (ref 0.00–0.07)
Immature Granulocytes: 1.3 %
Lymphocytes Absolute Automated: 0.89 10*3/uL (ref 0.42–3.22)
Lymphocytes Automated: 16.5 %
MCH: 28.8 pg (ref 25.1–33.5)
MCHC: 30.1 g/dL — ABNORMAL LOW (ref 31.5–35.8)
MCV: 95.5 fL (ref 78.0–96.0)
MPV: 11 fL (ref 8.9–12.5)
Monocytes Absolute Automated: 0.17 10*3/uL — ABNORMAL LOW (ref 0.21–0.85)
Monocytes: 3.1 %
Neutrophils Absolute: 4.25 10*3/uL (ref 1.10–6.33)
Neutrophils: 78.5 %
Nucleated RBC: 0 /100 WBC (ref 0.0–0.0)
Platelets: 174 10*3/uL (ref 142–346)
RBC: 3.13 10*6/uL — ABNORMAL LOW (ref 3.90–5.10)
RDW: 14 % (ref 11–15)
WBC: 5.41 10*3/uL (ref 3.10–9.50)

## 2019-12-31 LAB — GFR: EGFR: 9.6

## 2019-12-31 LAB — LACTIC ACID, PLASMA: Lactic Acid: 1.5 mmol/L (ref 0.2–2.0)

## 2019-12-31 LAB — HEMOLYSIS INDEX: Hemolysis Index: 14 (ref 0–18)

## 2019-12-31 LAB — TROPONIN I: Troponin I: 0.06 ng/mL — ABNORMAL HIGH (ref 0.00–0.05)

## 2019-12-31 MED ORDER — PIPERACILLIN SOD-TAZOBACTAM SO 4.5 (4-0.5) G IV SOLR
4.5000 g | Freq: Once | INTRAVENOUS | Status: AC
Start: 2019-12-31 — End: 2019-12-31
  Administered 2019-12-31: 23:00:00 4.5 g via INTRAVENOUS
  Filled 2019-12-31: qty 20

## 2019-12-31 MED ORDER — DEXAMETHASONE SOD PHOSPHATE PF 10 MG/ML IJ SOLN
10.0000 mg | Freq: Once | INTRAMUSCULAR | Status: DC
Start: 2019-12-31 — End: 2020-01-07

## 2019-12-31 NOTE — H&P (Signed)
[facility]      Patient: [name]  Date: 12/31/2019   DOB: Oct 14, 1955  Admission Date: 12/31/2019   MRN: [id number]  Attending: Charmaine Downs MD       Chief Complaint   Patient presents with    Shortness of Breath      History Gathered From: Patient    HISTORY AND PHYSICAL     [name] is a 64 y.o. female with a PMHx of ESRD-DD hypertension diabetes hyperlipidemia and others listed below came to the ED for evaluation of shortness of breath and hypoxia.  Patient reports that she has been having generalized body weakness and dry cough for the past 5 days.  Patient started to experience increased shortness of breath during hemodialysis today for which EMS was called and brought to the ED.  Patient not vaccinated for Covid.  She denies fever,  chest pain, palpitation, nausea, vomiting.  [place] ED she was hypoxic at 88% on room air and placed on nasal cannula.  Review of blood work showed H/H of 9/30, glucose of 265, BUN of 33, creatinine of 5.4, troponin of 0.06, positive for rapid Covid testing.  Chest x-ray showed bilateral hazy consolidation at the lung bases and midlung   Past Medical History:   Diagnosis Date    Chronic obstructive pulmonary disease     Diabetes mellitus     Hyperlipidemia     Hypertension     Sleep apnea 2018       Past Surgical History:   Procedure Laterality Date    EGD, BIOPSY N/A 05/14/2019    Procedure: EGD, BIOPSY;  Surgeon: Ronie Spies, MD;  Location: [facility];  Service: Gastroenterology;  Laterality: N/A;    EXPLORATORY LAPAROTOMY N/A 05/27/2019    Procedure: EXPLORATORY LAPAROTOMY;  Surgeon: Herbert Moors., MD;  Location: [facility];  Service: General;  Laterality: N/A;    JOINT REPLACEMENT      shoulder    JOINT REPLACEMENT      knee    OVARY SURGERY Left     Removal    REMOVAL, FOREIGN BODY N/A 05/27/2019    Procedure: REMOVAL, PERITONEAL DIALYSIS CATHETER;  Surgeon: Herbert Moors., MD;  Location: [facility];  Service:  General;  Laterality: N/A;    TUNNELED CATH CHECK/CHANGE (PERMCATH) N/A 07/03/2019    Procedure: TUNNELED CATH CHECK/CHANGE;  Surgeon: Maureen Ralphs, MD;  Location: [facility];  Service: Interventional Radiology;  Laterality: N/A;    TUNNELED CATH PLACEMENT (PERMCATH) Bilateral 05/23/2019    Procedure: TUNNELED CATH PLACEMENT;  Surgeon: Jacques Earthly, MD;  [facility];  Service: Interventional Radiology;  Laterality: Bilateral;    TUNNELED CATH PLACEMENT (PERMCATH) Right 07/31/2019    Procedure: TUNNELED CATH PLACEMENT;  Surgeon: Jacques Earthly, MD;  [facility]: [facility];  Service: Interventional Radiology;  Laterality: Right;       Prior to Admission medications    Medication Sig Start Date End Date Taking? Authorizing Provider   acetaminophen (TYLENOL) 325 MG tablet Take 2 tablets (650 mg total) by mouth every 6 (six) hours as needed for Pain or Fever 07/28/19   Chinery, Lawerance Sabal, MD   atorvastatin (LIPITOR) 40 MG tablet Take 40 mg by mouth daily    [provider]   bisacodyl (DULCOLAX) 5 MG EC tablet Take 1 tablet (5 mg total) by mouth daily as needed for Constipation 07/28/19   Lisa Roca, MD   colchicine 0.6 MG tablet Take  1 tablet (0.6 mg total) by mouth every 14 (fourteen) days Every 2 weeks next dose will be on 08/09/2019 07/28/19   Lisa Roca, MD   collagenase (SANTYL) ointment Apply topically every 24 hours 06/29/19   Tirrell, Ashley Mariner, MD   darbepoetin alfa (ARANESP) 60 MCG/0.3ML injection Inject 0.3 mLs (60 mcg total) into the skin once a week 07/30/19   Lisa Roca, MD   Dulaglutide (Trulicity) 7.02 OV/7.8HY Solution Pen-injector Inject 0.75 mg into the skin once a week    [provider]   famotidine (PEPCID) 40 MG tablet Take 40 mg by mouth daily    [provider]   fludrocortisone (FLORINEF) 0.1 MG tablet Take 1 tablet (0.1 mg total) by mouth daily 07/30/19   Forest Becker, MD   insulin aspart (NovoLOG) 100 UNIT/ML injection Inject 5 Units into the  skin 3 (three) times daily before meals  Patient taking differently: Inject 15 Units into the skin 3 (three) times daily before meals    06/29/19   Tirrell, Ashley Mariner, MD   lidocaine (LIDODERM) 5 % Place 1 patch onto the skin every 24 hours Remove & Discard patch within 12 hours or as directed by MD 06/12/19   Delfina Redwood, MD   melatonin 3 mg tablet Take 1 tablet (3 mg total) by mouth nightly as needed for Sleep 07/28/19   Lisa Roca, MD   midodrine (PROAMATINE) 10 MG tablet Take 1 tablet (10 mg total) by mouth 3 (three) times daily with meals 07/28/19   Lisa Roca, MD   nystatin (NYSTOP) powder Apply 1 application topically 2 (two) times daily 07/14/19   Amatya, Elodia Florence Man, MD   ondansetron (ZOFRAN-ODT) 4 MG disintegrating tablet Take 1 tablet (4 mg total) by mouth every 6 (six) hours as needed for Nausea 07/28/19   Lisa Roca, MD   pantoprazole (PROTONIX) 40 MG tablet Take 1 tablet (40 mg total) by mouth 2 (two) times daily 05/20/19   Ardeth Sportsman, MD   senna (SENOKOT) 8.6 MG Tab Take 1 tablet (8.6 mg total) by mouth nightly 07/28/19   Chinery, Lawerance Sabal, MD   sevelamer (RENVELA) 800 MG tablet Take 800 mg by mouth 3 (three) times daily with meals    [provider]   sodium hypochlorite (DAKIN'S QUARTER STRENGTH) 0.125 % Solution Irrigate with as directed daily Dressing changes twice daily 07/28/19   Lisa Roca, MD   vitamin C (ASCORBIC ACID) 500 MG tablet Take 1 tablet (500 mg total) by mouth daily 06/30/19   Tirrell, Ashley Mariner, MD   zinc sulfate (ZINCATE) 220 (50 Zn) MG capsule Take 1 capsule (220 mg total) by mouth daily 06/30/19   Tirrell, Ashley Mariner, MD       Allergies   Allergen Reactions    Lisinopril Hives    Metformin Hives       CODE STATUS: Full    PRIMARY CARE MD: Pcp, None, MD    Family History   Problem Relation Age of Onset    Diabetes Mother     Hypertension Mother     Diabetes Father     Hypertension Father     Diabetes Sister     Hypertension Sister     Diabetes  Brother     Hypertension Brother     Diabetes Paternal Aunt     Diabetes Paternal Uncle        Social History     Tobacco Use  Smoking status: Never Smoker    Smokeless tobacco: Never Used   Vaping Use    Vaping Use: Never used   Substance Use Topics    Alcohol use: Never    Drug use: Never       REVIEW OF SYSTEMS   12 point review of systems was done and found to be negative except the one mentioned in the PI    PHYSICAL EXAM     Vital Signs (most recent): BP 182/86    Pulse 81    Temp 99 F (37.2 C) (Oral)    Resp (!) 46    Ht 1.575 m (5\' 2" )    Wt 98.3 kg (216 lb 11.2 oz)    LMP  (LMP Unknown) Comment: post menopausal   SpO2 94%    BMI 39.63 kg/m   Constitutional:  Patient speaks freely in full sentences.   HEENT: NC/AT, PERRL, no scleral icterus or conjunctival pallor, no nasal discharge, MMM, oropharynx without erythema or exudate  Neck: trachea midline, supple, no cervical or supraclavicular lymphadenopathy or masses  Cardiovascular: RRR, normal S1 S2, no murmurs, gallops, palpable thrills, no JVD, Non-displaced PMI.  Respiratory: Normal rate. No retractions or increased work of breathing.  Coarse crepitation at the bases bilaterally  Gastrointestinal: +BS, non-distended, soft, non-tender, no rebound or guarding, no hepatosplenomegaly  Genitourinary: no suprapubic or costovertebral angle tenderness  Musculoskeletal: ROM and motor strength grossly normal. No clubbing, edema, or cyanosis. DP and radial pulses 2+ and symmetric.  Skin: Left arm AV fistula  Neurologic: EOMI, CN 2-12 grossly intact. no gross motor or sensory deficits  Psychiatric: AAOx3, affect and mood appropriate. The patient is alert, interactive, appropriate.    LABS & IMAGING     Recent Results (from the past 24 hour(s))   Comprehensive metabolic panel    Collection Time: 12/31/19  8:12 PM   Result Value Ref Range    Glucose 265 (H) 70 - 100 mg/dL    BUN 33.0 (H) 7.0 - 19.0 mg/dL    Creatinine 5.4 (H) 0.6 - 1.0 mg/dL    Sodium 136  136 - 145 mEq/L    Potassium 3.7 3.5 - 5.1 mEq/L    Chloride 92 (L) 100 - 111 mEq/L    CO2 25 22 - 29 mEq/L    Calcium 7.9 (L) 8.5 - 10.5 mg/dL    Protein, Total 7.3 6.0 - 8.3 g/dL    Albumin 2.7 (L) 3.5 - 5.0 g/dL    AST (SGOT) 20 5 - 34 U/L    ALT 10 0 - 55 U/L    Alkaline Phosphatase 121 (H) 37 - 106 U/L    Bilirubin, Total 0.4 0.2 - 1.2 mg/dL    Globulin 4.6 (H) 2.0 - 3.6 g/dL    Albumin/Globulin Ratio 0.6 (L) 0.9 - 2.2    Anion Gap 19.0 (H) 5.0 - 15.0   CBC and differential    Collection Time: 12/31/19  8:12 PM   Result Value Ref Range    WBC 5.41 3.10 - 9.50 x10 3/uL    Hgb 9.0 (L) 11.4 - 14.8 g/dL    Hematocrit 29.9 (L) 34.7 - 43.7 %    Platelets 174 142 - 346 x10 3/uL    RBC 3.13 (L) 3.90 - 5.10 x10 6/uL    MCV 95.5 78.0 - 96.0 fL    MCH 28.8 25.1 - 33.5 pg    MCHC 30.1 (L) 31.5 - 35.8 g/dL    RDW 14 11 -  15 %    MPV 11.0 8.9 - 12.5 fL    Neutrophils 78.5 None %    Lymphocytes Automated 16.5 None %    Monocytes 3.1 None %    Eosinophils Automated 0.4 None %    Basophils Automated 0.2 None %    Immature Granulocytes 1.3 None %    Nucleated RBC 0.0 0.0 - 0.0 /100 WBC    Neutrophils Absolute 4.25 1.10 - 6.33 x10 3/uL    Lymphocytes Absolute Automated 0.89 0.42 - 3.22 x10 3/uL    Monocytes Absolute Automated 0.17 (L) 0.21 - 0.85 x10 3/uL    Eosinophils Absolute Automated 0.02 0.00 - 0.44 x10 3/uL    Basophils Absolute Automated 0.01 0.00 - 0.08 x10 3/uL    Immature Granulocytes Absolute 0.07 0.00 - 0.07 x10 3/uL    Absolute NRBC 0.00 0.00 - 0.00 x10 3/uL   Lactic Acid    Collection Time: 12/31/19  8:12 PM   Result Value Ref Range    Lactic Acid 1.5 0.2 - 2.0 mmol/L   Troponin I    Collection Time: 12/31/19  8:12 PM   Result Value Ref Range    Troponin I 0.06 (H) 0.00 - 0.05 ng/mL   Hemolysis index    Collection Time: 12/31/19  8:12 PM   Result Value Ref Range    Hemolysis Index 14 0 - 18   GFR    Collection Time: 12/31/19  8:12 PM   Result Value Ref Range    EGFR 9.6    COVID-19 (SARS-CoV-2) and Influenza A/B,  NAA ([facility])    Collection Time: 12/31/19  8:54 PM    Specimen: Nasopharyngeal; Culturette   Result Value Ref Range    Purpose of COVID testing Diagnostic -PUI     SARS-CoV-2 Specimen Source Nasopharyngeal     SARS CoV 2 Overall Result Detected (A)     Influenza A Not Detected     Influenza B Not Detected    Arterial Blood Gas (ABG)    Collection Time: 12/31/19  9:42 PM   Result Value Ref Range    pH, Arterial 7.481 (H) 7.350 - 7.450    pCO2, Arterial 37.9 35.0 - 45.0 mmHg    pO2, Arterial 125.0 (H) 80.0 - 90.0 mmHg    HCO3, Arterial 28.0 23.0 - 29.0 mEq/L    Arterial Total CO2 58.5 (H) 24.0 - 30.0 mEq/L    Base Excess, Arterial 4.6 (H) -2.0 - 2.0 mEq/L    O2 Sat, Arterial 99.0 95.0 - 100.0 %    ABG CollectionSite Left Radl Right     Allen's Test yes     Temperature 37.0     FIO2 40 %    Status Oxygen     O2 Delivery Nasal Cannula     O2 Flow 5.0 L/min    Rate 18 BPM    Mode: spont     Pressure Control 0     ETCO2 0     Minute Ventilation 0.0 L/min    PEEP 0     Pressure Support 0     Tidal vol. n/a          IMAGING:  Chest x-ray  Bilateral hazy and consolidation throughout the peripheries of the  midlungs and lung bases which represent atypical/viral pneumonia, or  asymmetric pulmonary edema.    CARDIAC:  EKG Interpretation (upon my review):    NSR.  No ischemic ST or T wave changes  Markers:  Recent Labs  Lab 12/31/19  2012   Troponin I 0.06*       EMERGENCY DEPARTMENT COURSE:  Orders Placed This Encounter   Procedures    Blood Culture #1    Blood Culture #2    COVID-19 (SARS-CoV-2) and Influenza A/B, NAA ([facility])    Chest AP Portable    Comprehensive metabolic panel    CBC and differential    Lactic Acid    UA Reflex to Micro - Reflex to Culture    Troponin I    Hemolysis index    GFR    Arterial Blood Gas (ABG)    Procalcitonin    C Reactive Protein    Ferritin    Cardiac monitoring (ED ONLY)    Continuous Pulse Oximetry    Nasal Cannula Low-Flow (5 lpm or Less)    Eye protection  shoud be worn for all instances of  patient contact    Lab draws/medication administration should be bundled to minimize exposure risks    Full Code    Contact isolation status    Contact isolation    Airborne isolation for COVID-19 with negative pressure    ECG 12 lead    Saline lock IV #1    Admit to Inpatient    ADMIT TO INPATIENT       ASSESSMENT & PLAN     [name] is a 64 y.o. female admitted  with Pneumonia due to COVID-19 virus.    #Acute respiratory failure with hypoxia  -Presented for shortness of breath and hypoxia of 88%  -ABG showed pH of 7.4/PCO2 of 37/PO2 of 125/bicarb of 28  -Continue supplemental O2 by NC monitor O2 sat    #Bilateral pneumonia/COVID-19  -Chest x-ray showed bilateral haziness and consolidation  -Tested positive for COVID-19  -Maintain airborne/contact precaution  -CRP, ferritin, procalcitonin  -Decadron 6 mg IV x QD  -Patient given 1 dose of Zosyn in the ED  -ID consult in a.m.    #ESRD-DD  -Continue hemodialysis per schedule on TTS  -Obtain nephrology consult in a.m.    #Elevated troponin  -Mild likely demand ischemia in the setting of ESRD  -Twelve-lead EKG showed NSR and no ischemic ST or T wave changes  -Telemetry monitoring  -Trend troponin  -Continue home meds  -May consider cardiology consult in a.m.    #Hypertensive urgency  -Resume oral antihypertensive medications  -IV hydralazine as needed    #Anemia of CKD  -H/H stable  -Monitor CBC and transfuse PRBC as needed    #DM type II  -SSI  -Monitor fingersticks closely      GI Prophylaxis      Nutrition  Renal    DVT/VTE Prophylaxis  Heparin    Anticipated medical stability for discharge: 2 to 3 days    Service status/Reason for ongoing hospitalization: Acute respiratory failure with hypoxia/COVID-19   Anticipated Discharge Needs: TBD    Tia Alert, MD  12/31/2019 10:25 PM  Time Elapsed: 1 hour

## 2019-12-31 NOTE — EDIE (Signed)
COLLECTIVE?NOTIFICATION?12/31/2019 19:40?[name], [name]?MRN: [id number]    Criteria Met      5 ED Visits in 12 Months    Security and Safety  No recent Security Events currently on file    ED Care Guidelines  There are currently no ED Care Guidelines for this patient. Please check your facility's medical records system.        Prescription Monitoring Program  301??- Narcotic Use Score  130??- Sedative Use Score  000??- Stimulant Use Score  180??- Overdose Risk Score  - All Scores range from 000-999 with 75% of the population scoring < 200 and on 1% scoring above 650  - The last digit of the narcotic, sedative, and stimulant score indicates the number of active prescriptions of that type  - Higher Use scores correlate with increased prescribers, pharmacies, mg equiv, and overlapping prescriptions  - Higher Overdose Risk Scores correlate with increased risk of unintentional overdose death   Concerning or unexpectedly high scores should prompt a review of the PMP record; this does not constitute checking PMP for prescribing purposes.      E.D. Visit Count (12 mo.)  Facility Visits   [place] [facility] 6   Total 10   Note: Visits indicate total known visits.     Recent Emergency Department Visit Summary  Date Facility [facility] Type Diagnoses or Chief Complaint   Dec 31, 2019 [place] [place] [facility]      difficulty breathing      Sep 29, 2019 [place] [place] [facility]      Fall/Leg Pain/Hyperglycemia      Fall      Aug 16, 2019 [place] [place] [place] [facility]      Vomiting      Abdominal Pain      Aug 14, 2019 [place] [place] [facility]      N/V/D      Diarrhea      Diarrhea, unspecified      End stage renal disease      Dependence on renal dialysis      Breakdown (mechanical) of vascular dialysis catheter, initial encounter      Aug 04, 2019 [place] [place] [place]  [facility]      pain      Chest Pain      Jul 20, 2019 [place] [place] [facility]      Generalized Body Aches      Chest Pain      Shortness of breath      Jun 30, 2019 [place] [place] [facility]      dizziness, AMS      Dizziness      Fatigue      Fall      Dizziness and giddiness      Jun 23, 2019 [place] [place] [facility]      Numbness      Arm Pain      Dependence on renal dialysis      End stage renal disease      Cerebral infarction, unspecified      Anesthesia of skin      May 27, 2019 [place] [facility] [place] [facility]      abd pain      Abdominal Pain      Diarrhea, unspecified  Generalized abdominal pain      Partial intestinal obstruction, unspecified as to cause      Dependence on renal dialysis      End stage renal disease      Vomiting, unspecified      May 12, 2019 [place] [place] [facility]      Abdominal Pain      Fever      Diarrhea      Emesis      Nausea      Acute kidney failure, unspecified          Recent Inpatient Visit Summary  Date [place] Type Diagnoses or Chief Complaint   Sep 03, 2019 [place] [place] [place] [facility]      Dependence on renal dialysis      End stage renal disease      Nausea with vomiting, unspecified      Resistance to multiple antibiotics      Bacterial infection, unspecified      Aug 06, 2019 [place] [place] [place] [facility]      Other injury of unspecified body region, initial encounter      Chest pain, unspecified      Local infection of the skin and subcutaneous tissue, unspecified      Dependence on renal dialysis      End stage renal disease      Jul 20, 2019 [place]      Shortness of breath      Dizziness and giddiness      End stage renal disease      Unspecified open wound of abdominal wall, unspecified quadrant without penetration into peritoneal cavity, subsequent encounter      Dependence on renal  dialysis      Jun 30, 2019 [place]      Dizziness and giddiness      End stage renal disease      Dependence on renal dialysis      Jun 23, 2019 [place]      Dependence on renal dialysis      Cerebral infarction, unspecified      End stage renal disease      Anesthesia of skin      May 27, 2019 [place]      Vomiting, unspecified      Diarrhea, unspecified      End stage renal disease      Generalized abdominal pain      Dependence on renal dialysis      Partial intestinal obstruction, unspecified as to cause      May 12, 2019 [place]      Acute kidney failure, unspecified      Hematemesis      Breakdown (mechanical) of intraperitoneal dialysis catheter, initial encounter          Care Team  Provider Specialty Phone Fax Service Dates   Hazeline Junker Case Manager/Care Coordinator [phone]  Current    Theone Murdoch, DNP Nurse Practitioner: Family [phone] Current    PHAM, TRI M, MD Internal Medicine   Current    Horton Marshall MD, M.D. Internal Medicine [phone] Current      Collect[employer] Portal  This patient has  registered at the [facility]   For more information visit: [email or web address]     PLEASE NOTE:     1.   Any care recommendations and other clinical information are provided as guidelines or for historical purposes only, and providers should exercise their own clinical judgment when providing care.    2.   You may only use this information for purposes of treatment, payment or health care operations activities, and subject to the limitations of applicable [employer] Policies.    3.   You should consult directly with the organization that provided a care guideline or other clinical history with any questions  about additional information or accuracy or completeness of information provided.    ? 2021 [employer] - [email or web address]

## 2019-12-31 NOTE — ED Provider Notes (Signed)
EMERGENCY DEPARTMENT HISTORY AND PHYSICAL EXAM     None        ED Course         10:20 pm d/w Dr. Lucianne Lei, sound, [facility] inpatient. Graham w/ one time dose abx,     Patient feels better. I have discussed all testing results and plan of care with patient. Patient agrees with admission.         Provider Note:       R/o pna, r/o covid, r/o electrolyte abn    Personal Protective Equipment (PPE)    Face Shield, Gloves, Goggles, N95, Geologist, engineering and Surgical / Bouffant Cap      HISTORY OF PRESENT ILLNESS   Historian:Patient  Translator Used: No    64 y.o. female h/o copd, dm, hld, htn, esrd, missed hd and was getting it today when she became fatigue and sob and was unable to tolerate the HD. Pt was sent here by ems. During my exam, pt appeared somnolent, was unable to provide history. There is also a wet cough. Patient was seen immediately because of high probability of imminent or life threatening deterioration in the patient's condition.     On speaking w/ EMS, pt was in respiratory distress when they arrived. Pt was given duo-neb which did help "open her lungs." pt has not been vaccinated against covid and had been feeling unwell for 1 wk and so has missed HD prior to today's visit.     Nephrology: Lorica, Rana, I    1. Location of symptoms: chest  2. Onset of symptoms: 1 wk  3. What was patient doing when symptoms started (Context): unknown  4. Severity: severe  5. Timing: intermittent  6. Activities that worsen symptoms: exertion  7. Activities that improve symptoms: none  8. Quality:   9. Radiation of symptoms:  10. Associated signs and Symptoms: see above  11. Are symptoms worsening? Yes        MEDICAL HISTORY     Past Medical History:  Past Medical History:   Diagnosis Date    Chronic obstructive pulmonary disease     Diabetes mellitus     Hyperlipidemia     Hypertension     Sleep apnea 2018       Past Surgical History:  Past Surgical History:   Procedure Laterality Date    EGD, BIOPSY N/A 05/14/2019     Procedure: EGD, BIOPSY;  Surgeon: Ronie Spies, MD;  Location: [facility];  Service: Gastroenterology;  Laterality: N/A;    EXPLORATORY LAPAROTOMY N/A 05/27/2019    Procedure: EXPLORATORY LAPAROTOMY;  Surgeon: Herbert Moors., MD;  Location: [facility];  Service: General;  Laterality: N/A;    JOINT REPLACEMENT      shoulder    JOINT REPLACEMENT      knee    OVARY SURGERY Left     Removal    REMOVAL, FOREIGN BODY N/A 05/27/2019    Procedure: REMOVAL, PERITONEAL DIALYSIS CATHETER;  Surgeon: Herbert Moors., MD;  Location: [facility];  Service: General;  Laterality: N/A;    TUNNELED CATH CHECK/CHANGE (PERMCATH) N/A 07/03/2019    Procedure: TUNNELED CATH CHECK/CHANGE;  Surgeon: Maureen Ralphs, MD;  Location: [facility];  Service: Interventional Radiology;  Laterality: N/A;    TUNNELED CATH PLACEMENT (PERMCATH) Bilateral 05/23/2019    Procedure: TUNNELED CATH PLACEMENT;  Surgeon: Jacques Earthly, MD;  Location: [facility];  Service: Interventional Radiology;  Laterality: Bilateral;    TUNNELED CATH PLACEMENT (PERMCATH)  Right 07/31/2019    Procedure: TUNNELED CATH PLACEMENT;  Surgeon: Jacques Earthly, MD;  Location: [facility];  Service: Interventional Radiology;  Laterality: Right;       Social History:  Social History     Socioeconomic History    Marital status: Legally Separated     Spouse name: Not on file    Number of children: Not on file    Years of education: Not on file    Highest education level: Not on file   Occupational History    Not on file   Tobacco Use    Smoking status: Never Smoker    Smokeless tobacco: Never Used   Vaping Use    Vaping Use: Never used   Substance and Sexual Activity    Alcohol use: Never    Drug use: Never    Sexual activity: Not Currently   Other Topics Concern    Not on file   Social History Narrative    Not on file     Social Determinants of Health     Financial Resource Strain:     Difficulty of Paying Living Expenses: Not on file    Food Insecurity:     Worried About [place] in the Last Year: Not on file    Ran Out of Food in the Last Year: Not on file   Transportation Needs:     Lack of Transportation (Medical): Not on file    Lack of Transportation (Non-Medical): Not on file   Physical Activity:     Days of Exercise per Week: Not on file    Minutes of Exercise per Session: Not on file   Stress:     Feeling of Stress : Not on file   Social Connections:     Frequency of Communication with Friends and Family: Not on file    Frequency of Social Gatherings with Friends and Family: Not on file    Attends Religious Services: Not on file    Active Member of Clubs or Organizations: Not on file    Attends Archivist Meetings: Not on file    Marital Status: Not on file   Intimate Partner Violence:     Fear of Current or Ex-Partner: Not on file    Emotionally Abused: Not on file    Physically Abused: Not on file    Sexually Abused: Not on file   Housing Stability:     Unable to Pay for Housing in the Last Year: Not on file    Number of Places Lived in the Last Year: Not on file    Unstable Housing in the Last Year: Not on file       Family History:  Family History   Problem Relation Age of Onset    Diabetes Mother     Hypertension Mother     Diabetes Father     Hypertension Father     Diabetes Sister     Hypertension Sister     Diabetes Brother     Hypertension Brother     Diabetes Paternal Aunt     Diabetes Paternal Uncle        Allergies:  Allergies   Allergen Reactions    Lisinopril Hives    Metformin Hives       Outpatient Medication:  Previous Medications    ACETAMINOPHEN (TYLENOL) 325 MG TABLET    Take 2 tablets (650 mg total) by mouth every 6 (six) hours as needed for  Pain or Fever    ATORVASTATIN (LIPITOR) 40 MG TABLET    Take 40 mg by mouth daily    BISACODYL (DULCOLAX) 5 MG EC TABLET    Take 1 tablet (5 mg total) by mouth daily as needed for Constipation    COLCHICINE 0.6 MG TABLET    Take  1 tablet (0.6 mg total) by mouth every 14 (fourteen) days Every 2 weeks next dose will be on 08/09/2019    COLLAGENASE (SANTYL) OINTMENT    Apply topically every 24 hours    DARBEPOETIN ALFA (ARANESP) 60 MCG/0.3ML INJECTION    Inject 0.3 mLs (60 mcg total) into the skin once a week    DULAGLUTIDE (TRULICITY) 4.65 KC/1.2XN SOLUTION PEN-INJECTOR    Inject 0.75 mg into the skin once a week    FAMOTIDINE (PEPCID) 40 MG TABLET    Take 40 mg by mouth daily    FLUDROCORTISONE (FLORINEF) 0.1 MG TABLET    Take 1 tablet (0.1 mg total) by mouth daily    INSULIN ASPART (NOVOLOG) 100 UNIT/ML INJECTION    Inject 5 Units into the skin 3 (three) times daily before meals    LIDOCAINE (LIDODERM) 5 %    Place 1 patch onto the skin every 24 hours Remove & Discard patch within 12 hours or as directed by MD    MELATONIN 3 MG TABLET    Take 1 tablet (3 mg total) by mouth nightly as needed for Sleep    MIDODRINE (PROAMATINE) 10 MG TABLET    Take 1 tablet (10 mg total) by mouth 3 (three) times daily with meals    NYSTATIN (NYSTOP) POWDER    Apply 1 application topically 2 (two) times daily    ONDANSETRON (ZOFRAN-ODT) 4 MG DISINTEGRATING TABLET    Take 1 tablet (4 mg total) by mouth every 6 (six) hours as needed for Nausea    PANTOPRAZOLE (PROTONIX) 40 MG TABLET    Take 1 tablet (40 mg total) by mouth 2 (two) times daily    SENNA (SENOKOT) 8.6 MG TAB    Take 1 tablet (8.6 mg total) by mouth nightly    SEVELAMER (RENVELA) 800 MG TABLET    Take 800 mg by mouth 3 (three) times daily with meals    SODIUM HYPOCHLORITE (DAKIN'S QUARTER STRENGTH) 0.125 % SOLUTION    Irrigate with as directed daily Dressing changes twice daily    VITAMIN C (ASCORBIC ACID) 500 MG TABLET    Take 1 tablet (500 mg total) by mouth daily    ZINC SULFATE (ZINCATE) 220 (50 ZN) MG CAPSULE    Take 1 capsule (220 mg total) by mouth daily         REVIEW OF SYSTEMS   ADD ROS  Sob  fatigue  Review of Systems   Unable to perform ROS: Acuity of condition         PHYSICAL EXAM      Vitals:    12/31/19 1942   BP: 174/75   Pulse: (!) 53   Resp: 20   Temp: 99 F (37.2 C)   SpO2: (!) 88%         Nursing note and vitals reviewed.  Constitutional:  Well developed, well nourished. somnolent  Head:  Atraumatic. Normocephalic.    Eyes:  PERRL. EOMI. Conjunctivae are not pale.  ENT:  Mucous membranes are moist and intact. Patent airway.  Neck:  Supple. Full ROM.    Cardiovascular:  bradycardia. Regular rhythm. Left arm av fistula good thrill.  Respiratory:  labored breathing    GI:  Soft and non-distended. There is no tenderness. No rebound, guarding, or rigidity.  Back:  Full ROM. Nontender.  MSK:  No edema. No cyanosis. No clubbing. Full range of motion in all extremities.  Skin:  Skin is warm and dry.  No diaphoresis. No rash.   Neurological:  Alert, awake, and appropriate. Normal speech. Motor normal.  Psychiatric:  Good eye contact.           MEDICAL DECISION MAKING     Vital Signs: Reviewed the patients vital signs.   Nursing Notes: Reviewed and utilized available nursing notes.  Medical Records Reviewed: Reviewed available past medical records.      PROCEDURES        CARDIAC STUDIES        Monitor Strip  Interpreted by ED Physician  Rate: 75  Rhythm: SR   ST Changes: none     EKG Interpretation:    20:01 SR at 77 bpm, NAD, no LVH, qrs 90, qtc 482 (-) ST-T changes similar to ekg on 07/29/2019 as read by me.     IMAGING STUDIES      Chest AP Portable   Final Result      Bilateral hazy and consolidation throughout the peripheries of the   midlungs and lung bases which represent atypical/viral pneumonia, or   asymmetric pulmonary edema.      Raphael Gibney, DO    12/31/2019 8:02 PM             PULSE OXIMETRY    Oxygen Saturation by Pulse Oximetry: 94%  Interventions: none  Interpretation: not adequate; nc 4 L started    EMERGENCY DEPT. MEDICATIONS      ED Medication Orders (From admission, onward)    Start Ordered     Status Ordering Provider    12/31/19 2220 12/31/19 2219  piperacillin-tazobactam  (ZOSYN) injection 4.5 g  Once        Route: Intravenous  Ordered Dose: 4.5 g     Ordered Xcaret Morad SHU          LABORATORY RESULTS    Ordered and independently interpreted AVAILABLE laboratory tests. Please see results section in chart for full details.  Results for orders placed or performed during the hospital encounter of 12/31/19   COVID-19 (SARS-CoV-2) and Influenza A/B, NAA ([facility])    Specimen: Nasopharyngeal; Culturette   Result Value Ref Range    Purpose of COVID testing Diagnostic -[facility]     SARS-CoV-2 Specimen Source Nasopharyngeal     SARS CoV 2 Overall Result Detected (A)     Influenza A Not Detected     Influenza B Not Detected    Comprehensive metabolic panel   Result Value Ref Range    Glucose 265 (H) 70 - 100 mg/dL    BUN 33.0 (H) 7.0 - 19.0 mg/dL    Creatinine 5.4 (H) 0.6 - 1.0 mg/dL    Sodium 136 136 - 145 mEq/L    Potassium 3.7 3.5 - 5.1 mEq/L    Chloride 92 (L) 100 - 111 mEq/L    CO2 25 22 - 29 mEq/L    Calcium 7.9 (L) 8.5 - 10.5 mg/dL    Protein, Total 7.3 6.0 - 8.3 g/dL    Albumin 2.7 (L) 3.5 - 5.0 g/dL    AST (SGOT) 20 5 - 34 U/L    ALT 10 0 - 55 U/L    Alkaline Phosphatase 121 (H) 37 - 106  U/L    Bilirubin, Total 0.4 0.2 - 1.2 mg/dL    Globulin 4.6 (H) 2.0 - 3.6 g/dL    Albumin/Globulin Ratio 0.6 (L) 0.9 - 2.2    Anion Gap 19.0 (H) 5.0 - 15.0   CBC and differential   Result Value Ref Range    WBC 5.41 3.10 - 9.50 x10 3/uL    Hgb 9.0 (L) 11.4 - 14.8 g/dL    Hematocrit 29.9 (L) 34.7 - 43.7 %    Platelets 174 142 - 346 x10 3/uL    RBC 3.13 (L) 3.90 - 5.10 x10 6/uL    MCV 95.5 78.0 - 96.0 fL    MCH 28.8 25.1 - 33.5 pg    MCHC 30.1 (L) 31.5 - 35.8 g/dL    RDW 14 11 - 15 %    MPV 11.0 8.9 - 12.5 fL    Neutrophils 78.5 None %    Lymphocytes Automated 16.5 None %    Monocytes 3.1 None %    Eosinophils Automated 0.4 None %    Basophils Automated 0.2 None %    Immature Granulocytes 1.3 None %    Nucleated RBC 0.0 0.0 - 0.0 /100 WBC    Neutrophils Absolute 4.25 1.10 - 6.33 x10 3/uL    Lymphocytes  Absolute Automated 0.89 0.42 - 3.22 x10 3/uL    Monocytes Absolute Automated 0.17 (L) 0.21 - 0.85 x10 3/uL    Eosinophils Absolute Automated 0.02 0.00 - 0.44 x10 3/uL    Basophils Absolute Automated 0.01 0.00 - 0.08 x10 3/uL    Immature Granulocytes Absolute 0.07 0.00 - 0.07 x10 3/uL    Absolute NRBC 0.00 0.00 - 0.00 x10 3/uL   Lactic Acid   Result Value Ref Range    Lactic Acid 1.5 0.2 - 2.0 mmol/L   Troponin I   Result Value Ref Range    Troponin I 0.06 (H) 0.00 - 0.05 ng/mL   Hemolysis index   Result Value Ref Range    Hemolysis Index 14 0 - 18   GFR   Result Value Ref Range    EGFR 9.6    Arterial Blood Gas (ABG)   Result Value Ref Range    pH, Arterial 7.481 (H) 7.350 - 7.450    pCO2, Arterial 37.9 35.0 - 45.0 mmHg    pO2, Arterial 125.0 (H) 80.0 - 90.0 mmHg    HCO3, Arterial 28.0 23.0 - 29.0 mEq/L    Arterial Total CO2 58.5 (H) 24.0 - 30.0 mEq/L    Base Excess, Arterial 4.6 (H) -2.0 - 2.0 mEq/L    O2 Sat, Arterial 99.0 95.0 - 100.0 %    ABG CollectionSite Left Radl Right     Allen's Test yes     Temperature 37.0     FIO2 40 %    Status Oxygen     O2 Delivery Nasal Cannula     O2 Flow 5.0 L/min    Rate 18 BPM    Mode: spont     Pressure Control 0     ETCO2 0     Minute Ventilation 0.0 L/min    PEEP 0     Pressure Support 0     Tidal vol. n/a        CLINICAL DECISION SUPPORT         CRITICAL CARE    CRITICAL CARE: The high probability of sudden, clinically significant deterioration in the patient's condition required the highest level of my preparedness to intervene urgently.  The services I provided to this patient were to treat and/or prevent clinically significant deterioration that could result in: death, permanent disability.  Services included the following: chart data review, reviewing nursing notes and/or old charts, documentation time, consultant collaboration regarding findings and treatment options, medication orders and management, direct patient care, re-evaluations, vital sign assessments and  ordering, interpreting and reviewing diagnostic studies/lab tests.    Aggregate critical care time was 45 min, which includes only time during which I was engaged in work directly related to the patient's care, as described above, whether at the bedside or elsewhere in the Emergency Department.  It did not include time spent performing other reported procedures or the services of residents, students, nurses or physician assistants.          2. Aspirin: Aspirin was not given because the patient did not present with a stroke at the time of their Emergency Department evaluation.    3. Core Measures:     Please choose ONLY A, B, or C for your sepsis documentation      A. ------------Severe Sepsis/Septic Shock Exclusion----------------------------    Pt is excluded from severe sepsis or septic shock consideration at the time of admission at 10:23 pm [enter time] because of one or more reasons below:    All SIRS, abnl vitals, organ dysfunction are NOT due to severe sepsis or septic shock, but due to alternative cause: COVID-19 [list cause].     The timestamp from the preceding paragraph above also applies to every infectious and/or SEP-1-related diagnosis in Clinical Impression or MDM sections of this note.           ATTESTATIONS      Physician Attestation: Di Kindle Caleyah Jr, MD PhD , have been the primary provider for [name] during this Emergency Dept visit and have reviewed the chart documented for accuracy and agree with its content.       DIAGNOSIS      Diagnosis:  Final diagnoses:   Pneumonia due to COVID-19 virus   SOB (shortness of breath)   ESRD (end stage renal disease) on dialysis   Elevated troponin I level       Disposition:  ED Disposition     ED Disposition Condition Date/Time Comment    Admit  Wed Dec 31, 2019 10:23 PM Admitting Physician: Saverio Danker [[id number]]   Service:: Medicine [[id number]]   Estimated Length of Stay: > or = to 2 midnights   Tentative Discharge Plan?: Home or Self Care [1]    Does patient need telemetry?: Yes   Telemetry type (separate Telemetry order is also required):: Medical telemetry            Prescriptions:  Patient's Medications   New Prescriptions    No medications on file   Previous Medications    ACETAMINOPHEN (TYLENOL) 325 MG TABLET    Take 2 tablets (650 mg total) by mouth every 6 (six) hours as needed for Pain or Fever    ATORVASTATIN (LIPITOR) 40 MG TABLET    Take 40 mg by mouth daily    BISACODYL (DULCOLAX) 5 MG EC TABLET    Take 1 tablet (5 mg total) by mouth daily as needed for Constipation    COLCHICINE 0.6 MG TABLET    Take 1 tablet (0.6 mg total) by mouth every 14 (fourteen) days Every 2 weeks next dose will be on 08/09/2019    COLLAGENASE (SANTYL) OINTMENT    Apply topically every 24 hours  DARBEPOETIN ALFA (ARANESP) 60 MCG/0.3ML INJECTION    Inject 0.3 mLs (60 mcg total) into the skin once a week    DULAGLUTIDE (TRULICITY) 1.06 YI/9.4WN SOLUTION PEN-INJECTOR    Inject 0.75 mg into the skin once a week    FAMOTIDINE (PEPCID) 40 MG TABLET    Take 40 mg by mouth daily    FLUDROCORTISONE (FLORINEF) 0.1 MG TABLET    Take 1 tablet (0.1 mg total) by mouth daily    INSULIN ASPART (NOVOLOG) 100 UNIT/ML INJECTION    Inject 5 Units into the skin 3 (three) times daily before meals    LIDOCAINE (LIDODERM) 5 %    Place 1 patch onto the skin every 24 hours Remove & Discard patch within 12 hours or as directed by MD    MELATONIN 3 MG TABLET    Take 1 tablet (3 mg total) by mouth nightly as needed for Sleep    MIDODRINE (PROAMATINE) 10 MG TABLET    Take 1 tablet (10 mg total) by mouth 3 (three) times daily with meals    NYSTATIN (NYSTOP) POWDER    Apply 1 application topically 2 (two) times daily    ONDANSETRON (ZOFRAN-ODT) 4 MG DISINTEGRATING TABLET    Take 1 tablet (4 mg total) by mouth every 6 (six) hours as needed for Nausea    PANTOPRAZOLE (PROTONIX) 40 MG TABLET    Take 1 tablet (40 mg total) by mouth 2 (two) times daily    SENNA (SENOKOT) 8.6 MG TAB    Take 1 tablet (8.6 mg  total) by mouth nightly    SEVELAMER (RENVELA) 800 MG TABLET    Take 800 mg by mouth 3 (three) times daily with meals    SODIUM HYPOCHLORITE (DAKIN'S QUARTER STRENGTH) 0.125 % SOLUTION    Irrigate with as directed daily Dressing changes twice daily    VITAMIN C (ASCORBIC ACID) 500 MG TABLET    Take 1 tablet (500 mg total) by mouth daily    ZINC SULFATE (ZINCATE) 220 (50 ZN) MG CAPSULE    Take 1 capsule (220 mg total) by mouth daily   Modified Medications    No medications on file   Discontinued Medications    No medications on file              Sandeep Radell, Devin Going, MD PhD  12/31/19 2226

## 2019-12-31 NOTE — ED Triage Notes (Signed)
[name] is a 64 y.o. female biba for c/o shortness of breath x 1 day. Pt was at Dialysis today and was not able to keep her O2 up. Pt has been fighting a flu for 1 week and has had trouble keeping her O2 sats up.

## 2019-12-31 NOTE — ED Notes (Signed)
Bed: PU35  Expected date: 12/31/19  Expected time: 7:34 PM  Means of arrival: [id number] [facility]  Comments:

## 2019-12-31 NOTE — ED Notes (Signed)
[place]  ED NURSING NOTE FOR THE RECEIVING INPATIENT NURSE   ED NURSE Natale Lay [id number]   ED CHARGE RN [phone]   ADMISSION INFORMATION   [name] is a 64 y.o. female admitted with a diagnosis of:    1. Pneumonia due to COVID-19 virus    2. SOB (shortness of breath)    3. ESRD (end stage renal disease) on dialysis    4. Elevated troponin I level         Isolation: Airborne   Allergies: Lisinopril and Metformin   Holding Orders confirmed? Yes   Belongings Documented? Yes   Home medications sent to pharmacy confirmed? No   NURSING CARE   Patient Comes From:   Mental Status: Home Independent  alert and oriented   ADL: Independent with all ADLs   Ambulation: mild difficulty   Pertinent Information  and Safety Concerns: fistula in left arm, pt covid positive     COVID Test sent to lab? Yes, covid positive   VITAL SIGNS   Time BP Temp Pulse Resp SpO2   2156 182/86 99 81 20 94   CT / NIH   CT Head ordered on this patient?  No   NIH/Dysphagia assessment done prior to admission? No   PERSONAL PROTECTIVE EQUIPMENT   Face Shield, Gloves, Goggles and N95   LAB RESULTS   Labs Reviewed   COVID-19 (SARS-COV-2) & INFLUENZA  A/B, NAA ([facility]) - Abnormal; Notable for the following components:       Result Value    SARS CoV 2 Overall Result Detected (*)     All other components within normal limits    Narrative:     o Collect and clearly label specimen type:  o PREFERRED-Upper respiratory specimen: One Nasopharyngeal  Swab in Transport Media.  o Hand deliver to laboratory ASAP  Diagnostic -PUI   COMPREHENSIVE METABOLIC PANEL - Abnormal; Notable for the following components:    Glucose 265 (*)     BUN 33.0 (*)     Creatinine 5.4 (*)     Chloride 92 (*)     Calcium 7.9 (*)     Albumin 2.7 (*)     Alkaline Phosphatase 121 (*)     Globulin 4.6 (*)     Albumin/Globulin Ratio 0.6 (*)     Anion Gap 19.0 (*)     All other components within normal limits    Narrative:     Sepsis   CBC AND  DIFFERENTIAL - Abnormal; Notable for the following components:    Hgb 9.0 (*)     Hematocrit 29.9 (*)     RBC 3.13 (*)     MCHC 30.1 (*)     Monocytes Absolute Automated 0.17 (*)     All other components within normal limits    Narrative:     Sepsis   TROPONIN I - Abnormal; Notable for the following components:    Troponin I 0.06 (*)     All other components within normal limits    Narrative:     Sepsis   BLOOD GAS, ARTERIAL - Abnormal; Notable for the following components:    pH, Arterial 7.481 (*)     pO2, Arterial 125.0 (*)     Arterial Total CO2 58.5 (*)     Base Excess, Arterial 4.6 (*)     All other components within normal limits    Narrative:     Sepsis   CULTURE  BLOOD AEROBIC AND ANAEROBIC    Narrative:     1 BLUE+1 PURPLE   CULTURE BLOOD AEROBIC AND ANAEROBIC    Narrative:     1 BLUE+1 PURPLE   LACTIC ACID, PLASMA   HEMOLYSIS INDEX    Narrative:     Sepsis   GFR    Narrative:     Sepsis   URINALYSIS REFLEX TO MICROSCOPIC EXAM - REFLEX TO CULTURE   PROCALCITONIN   C-REACTIVE PROTEIN   FERRITIN   PROCALCITONIN    Narrative:     Remove serum from gel or clot before transporting.          Ticket to [employer]: Yes

## 2019-12-31 NOTE — ED Notes (Addendum)
Can go off monitor per Dr. He

## 2020-01-01 DIAGNOSIS — R197 Diarrhea, unspecified: Secondary | ICD-10-CM | POA: Diagnosis present

## 2020-01-01 LAB — GLUCOSE WHOLE BLOOD - POCT
Whole Blood Glucose POCT: 230 mg/dL — ABNORMAL HIGH (ref 70–100)
Whole Blood Glucose POCT: 232 mg/dL — ABNORMAL HIGH (ref 70–100)
Whole Blood Glucose POCT: 234 mg/dL — ABNORMAL HIGH (ref 70–100)
Whole Blood Glucose POCT: 334 mg/dL — ABNORMAL HIGH (ref 70–100)
Whole Blood Glucose POCT: 392 mg/dL — ABNORMAL HIGH (ref 70–100)
Whole Blood Glucose POCT: 411 mg/dL — ABNORMAL HIGH (ref 70–100)

## 2020-01-01 LAB — BASIC METABOLIC PANEL
Anion Gap: 19 — ABNORMAL HIGH (ref 5.0–15.0)
BUN: 38 mg/dL — ABNORMAL HIGH (ref 7.0–19.0)
CO2: 25 mEq/L (ref 22–29)
Calcium: 7.9 mg/dL — ABNORMAL LOW (ref 8.5–10.5)
Chloride: 94 mEq/L — ABNORMAL LOW (ref 100–111)
Creatinine: 5.9 mg/dL — ABNORMAL HIGH (ref 0.6–1.0)
Glucose: 244 mg/dL — ABNORMAL HIGH (ref 70–100)
Potassium: 3.7 mEq/L (ref 3.5–5.1)
Sodium: 138 mEq/L (ref 136–145)

## 2020-01-01 LAB — ECG 12-LEAD
Atrial Rate: 77 {beats}/min
P Axis: 50 degrees
P-R Interval: 130 ms
Q-T Interval: 426 ms
QRS Duration: 90 ms
QTC Calculation (Bezet): 482 ms
R Axis: 50 degrees
T Axis: 7 degrees
Ventricular Rate: 77 {beats}/min

## 2020-01-01 LAB — TROPONIN I
Troponin I: 0.05 ng/mL (ref 0.00–0.05)
Troponin I: 0.05 ng/mL (ref 0.00–0.05)

## 2020-01-01 LAB — CBC
Absolute NRBC: 0.02 10*3/uL — ABNORMAL HIGH (ref 0.00–0.00)
Hematocrit: 24.7 % — ABNORMAL LOW (ref 34.7–43.7)
Hgb: 7.4 g/dL — ABNORMAL LOW (ref 11.4–14.8)
MCH: 28.9 pg (ref 25.1–33.5)
MCHC: 30 g/dL — ABNORMAL LOW (ref 31.5–35.8)
MCV: 96.5 fL — ABNORMAL HIGH (ref 78.0–96.0)
MPV: 11.4 fL (ref 8.9–12.5)
Nucleated RBC: 0.5 /100 WBC — ABNORMAL HIGH (ref 0.0–0.0)
Platelets: 148 10*3/uL (ref 142–346)
RBC: 2.56 10*6/uL — ABNORMAL LOW (ref 3.90–5.10)
RDW: 14 % (ref 11–15)
WBC: 4.3 10*3/uL (ref 3.10–9.50)

## 2020-01-01 LAB — PROCALCITONIN: Procalcitonin: 0.77 — ABNORMAL HIGH (ref 0.00–0.10)

## 2020-01-01 LAB — HEMOGLOBIN A1C
Average Estimated Glucose: 246 mg/dL
Hemoglobin A1C: 10.2 % — ABNORMAL HIGH (ref 4.6–5.9)

## 2020-01-01 LAB — C-REACTIVE PROTEIN: C-Reactive Protein: 17.9 mg/dL — ABNORMAL HIGH (ref 0.0–0.8)

## 2020-01-01 LAB — HEMOLYSIS INDEX: Hemolysis Index: 5 (ref 0–18)

## 2020-01-01 LAB — FERRITIN: Ferritin: 3158.4 ng/mL — ABNORMAL HIGH (ref 4.60–204.00)

## 2020-01-01 LAB — GFR: EGFR: 8.7

## 2020-01-01 MED ORDER — MELATONIN 3 MG PO TABS
3.0000 mg | ORAL_TABLET | Freq: Every evening | ORAL | Status: DC | PRN
Start: 2020-01-01 — End: 2020-01-08

## 2020-01-01 MED ORDER — INSULIN GLARGINE 100 UNIT/ML SC SOLN
20.0000 [IU] | Freq: Every evening | SUBCUTANEOUS | Status: DC
Start: 2020-01-01 — End: 2020-01-08
  Administered 2020-01-01 – 2020-01-07 (×7): 20 [IU] via SUBCUTANEOUS
  Filled 2020-01-01 (×7): qty 20

## 2020-01-01 MED ORDER — MIDODRINE HCL 5 MG PO TABS
10.0000 mg | ORAL_TABLET | Freq: Three times a day (TID) | ORAL | Status: DC
Start: 2020-01-01 — End: 2020-01-04
  Administered 2020-01-01 – 2020-01-04 (×9): 10 mg via ORAL
  Filled 2020-01-01 (×11): qty 2

## 2020-01-01 MED ORDER — BISACODYL 5 MG PO TBEC
5.0000 mg | DELAYED_RELEASE_TABLET | Freq: Every day | ORAL | Status: DC | PRN
Start: 2020-01-01 — End: 2020-01-08

## 2020-01-01 MED ORDER — INSULIN LISPRO 100 UNIT/ML SC SOLN
1.0000 [IU] | Freq: Every evening | SUBCUTANEOUS | Status: DC
Start: 2020-01-01 — End: 2020-01-08
  Administered 2020-01-01: 21:00:00 4 [IU] via SUBCUTANEOUS
  Administered 2020-01-03: 01:00:00 2 [IU] via SUBCUTANEOUS
  Administered 2020-01-03 – 2020-01-04 (×2): 4 [IU] via SUBCUTANEOUS
  Administered 2020-01-05 – 2020-01-06 (×2): 3 [IU] via SUBCUTANEOUS
  Administered 2020-01-07: 23:00:00 4 [IU] via SUBCUTANEOUS
  Filled 2020-01-01: qty 6
  Filled 2020-01-01: qty 12
  Filled 2020-01-01: qty 9
  Filled 2020-01-01: qty 12
  Filled 2020-01-01: qty 24
  Filled 2020-01-01: qty 9
  Filled 2020-01-01: qty 3
  Filled 2020-01-01 (×3): qty 12
  Filled 2020-01-01: qty 15

## 2020-01-01 MED ORDER — PANTOPRAZOLE SODIUM 40 MG PO TBEC
40.0000 mg | DELAYED_RELEASE_TABLET | Freq: Every morning | ORAL | Status: DC
Start: 2020-01-01 — End: 2020-01-08
  Administered 2020-01-01 – 2020-01-08 (×8): 40 mg via ORAL
  Filled 2020-01-01 (×8): qty 1

## 2020-01-01 MED ORDER — ONDANSETRON HCL 4 MG/2ML IJ SOLN
4.0000 mg | Freq: Once | INTRAMUSCULAR | Status: AC | PRN
Start: 2020-01-01 — End: 2020-01-03
  Administered 2020-01-03: 09:00:00 4 mg via INTRAVENOUS
  Filled 2020-01-01: qty 2

## 2020-01-01 MED ORDER — HYDRALAZINE HCL 20 MG/ML IJ SOLN
10.0000 mg | Freq: Four times a day (QID) | INTRAMUSCULAR | Status: DC | PRN
Start: 2020-01-01 — End: 2020-01-06
  Administered 2020-01-01 – 2020-01-06 (×3): 10 mg via INTRAVENOUS
  Filled 2020-01-01 (×3): qty 1

## 2020-01-01 MED ORDER — INSULIN GLARGINE 100 UNIT/ML SC SOLN
20.0000 [IU] | SUBCUTANEOUS | Status: DC
Start: 2020-01-01 — End: 2020-01-01

## 2020-01-01 MED ORDER — ASCORBIC ACID 500 MG PO TABS
500.0000 mg | ORAL_TABLET | Freq: Every day | ORAL | Status: DC
Start: 2020-01-01 — End: 2020-01-08
  Administered 2020-01-01 – 2020-01-08 (×8): 500 mg via ORAL
  Filled 2020-01-01 (×8): qty 1

## 2020-01-01 MED ORDER — ATORVASTATIN CALCIUM 40 MG PO TABS
40.0000 mg | ORAL_TABLET | Freq: Every day | ORAL | Status: DC
Start: 2020-01-01 — End: 2020-01-08
  Administered 2020-01-01 – 2020-01-08 (×8): 40 mg via ORAL
  Filled 2020-01-01 (×8): qty 1

## 2020-01-01 MED ORDER — NALOXONE HCL 0.4 MG/ML IJ SOLN (WRAP)
0.2000 mg | INTRAMUSCULAR | Status: DC | PRN
Start: 2020-01-01 — End: 2020-01-08

## 2020-01-01 MED ORDER — GLUCOSE 40 % PO GEL
15.0000 g | ORAL | Status: DC | PRN
Start: 2020-01-01 — End: 2020-01-08

## 2020-01-01 MED ORDER — HEPARIN SODIUM (PORCINE) 5000 UNIT/ML IJ SOLN
5000.0000 [IU] | Freq: Two times a day (BID) | INTRAMUSCULAR | Status: DC
Start: 2020-01-01 — End: 2020-01-08
  Administered 2020-01-01 – 2020-01-08 (×16): 5000 [IU] via SUBCUTANEOUS
  Filled 2020-01-01 (×16): qty 1

## 2020-01-01 MED ORDER — FLUDROCORTISONE ACETATE 0.1 MG PO TABS
0.1000 mg | ORAL_TABLET | Freq: Every day | ORAL | Status: DC
Start: 2020-01-01 — End: 2020-01-05
  Administered 2020-01-01 – 2020-01-05 (×5): 0.1 mg via ORAL
  Filled 2020-01-01 (×6): qty 1

## 2020-01-01 MED ORDER — SODIUM CHLORIDE 0.9 % IV MBP
1.0000 g | INTRAVENOUS | Status: DC
Start: 2020-01-01 — End: 2020-01-03
  Administered 2020-01-01 – 2020-01-03 (×3): 1 g via INTRAVENOUS
  Filled 2020-01-01 (×3): qty 1000

## 2020-01-01 MED ORDER — VANCOMYCIN ORAL SOLUTION 50 MG/ML UNIT DOSE
125.0000 mg | Freq: Four times a day (QID) | ORAL | Status: DC
Start: 2020-01-02 — End: 2020-01-05
  Administered 2020-01-01 – 2020-01-05 (×16): 125 mg via ORAL
  Filled 2020-01-01 (×17): qty 2.5

## 2020-01-01 MED ORDER — CHOLESTYRAMINE 4 G PO PACK
1.0000 | PACK | Freq: Two times a day (BID) | ORAL | Status: DC
Start: 2020-01-01 — End: 2020-01-03
  Administered 2020-01-01 – 2020-01-03 (×4): 1 via ORAL
  Filled 2020-01-01 (×4): qty 1

## 2020-01-01 MED ORDER — MICONAZOLE NITRATE 2 % EX CREAM WITH ZINC OXIDE
TOPICAL_CREAM | Freq: Four times a day (QID) | CUTANEOUS | Status: DC
Start: 2020-01-01 — End: 2020-01-08
  Filled 2020-01-01 (×3): qty 142

## 2020-01-01 MED ORDER — ACETAMINOPHEN 325 MG PO TABS
650.0000 mg | ORAL_TABLET | Freq: Four times a day (QID) | ORAL | Status: DC | PRN
Start: 2020-01-01 — End: 2020-01-08
  Administered 2020-01-01 – 2020-01-06 (×5): 650 mg via ORAL
  Filled 2020-01-01 (×5): qty 2

## 2020-01-01 MED ORDER — MELATONIN 3 MG PO TABS
3.0000 mg | ORAL_TABLET | Freq: Every evening | ORAL | Status: DC | PRN
Start: 2020-01-01 — End: 2020-01-01

## 2020-01-01 MED ORDER — DEXAMETHASONE SODIUM PHOSPHATE 4 MG/ML IJ SOLN (WRAP)
6.0000 mg | Freq: Every day | INTRAMUSCULAR | Status: DC
Start: 2020-01-01 — End: 2020-01-07
  Administered 2020-01-01 – 2020-01-06 (×6): 6 mg via INTRAVENOUS
  Filled 2020-01-01 (×8): qty 2

## 2020-01-01 MED ORDER — ACETAMINOPHEN 650 MG RE SUPP
650.0000 mg | Freq: Four times a day (QID) | RECTAL | Status: DC | PRN
Start: 2020-01-01 — End: 2020-01-08

## 2020-01-01 MED ORDER — ACETAMINOPHEN 325 MG PO TABS
650.0000 mg | ORAL_TABLET | Freq: Once | ORAL | Status: DC | PRN
Start: 2020-01-01 — End: 2020-01-08

## 2020-01-01 MED ORDER — INSULIN LISPRO 100 UNIT/ML SC SOLN
1.0000 [IU] | Freq: Three times a day (TID) | SUBCUTANEOUS | Status: DC
Start: 2020-01-01 — End: 2020-01-08
  Administered 2020-01-01: 18:00:00 8 [IU] via SUBCUTANEOUS
  Administered 2020-01-01 (×2): 3 [IU] via SUBCUTANEOUS
  Administered 2020-01-02: 21:00:00 8 [IU] via SUBCUTANEOUS
  Administered 2020-01-02 (×2): 1 [IU] via SUBCUTANEOUS
  Administered 2020-01-03: 14:00:00 3 [IU] via SUBCUTANEOUS
  Administered 2020-01-03: 17:00:00 7 [IU] via SUBCUTANEOUS
  Administered 2020-01-04: 17:00:00 8 [IU] via SUBCUTANEOUS
  Administered 2020-01-05: 13:00:00 5 [IU] via SUBCUTANEOUS
  Administered 2020-01-06: 13:00:00 3 [IU] via SUBCUTANEOUS
  Administered 2020-01-06: 18:00:00 7 [IU] via SUBCUTANEOUS
  Administered 2020-01-07: 13:00:00 1 [IU] via SUBCUTANEOUS
  Administered 2020-01-08: 13:00:00 5 [IU] via SUBCUTANEOUS
  Filled 2020-01-01: qty 15
  Filled 2020-01-01: qty 9
  Filled 2020-01-01: qty 15
  Filled 2020-01-01: qty 6
  Filled 2020-01-01 (×3): qty 15
  Filled 2020-01-01: qty 9
  Filled 2020-01-01: qty 3
  Filled 2020-01-01: qty 21
  Filled 2020-01-01: qty 9
  Filled 2020-01-01: qty 12

## 2020-01-01 MED ORDER — GLUCAGON 1 MG IJ SOLR (WRAP)
1.0000 mg | INTRAMUSCULAR | Status: DC | PRN
Start: 2020-01-01 — End: 2020-01-08

## 2020-01-01 MED ORDER — SEVELAMER CARBONATE 800 MG PO TABS
800.0000 mg | ORAL_TABLET | Freq: Three times a day (TID) | ORAL | Status: DC
Start: 2020-01-01 — End: 2020-01-08
  Administered 2020-01-01 – 2020-01-08 (×22): 800 mg via ORAL
  Filled 2020-01-01 (×26): qty 1

## 2020-01-01 MED ORDER — DEXTROSE 50 % IV SOLN
12.5000 g | INTRAVENOUS | Status: DC | PRN
Start: 2020-01-01 — End: 2020-01-08

## 2020-01-01 MED ORDER — GUAIFENESIN-CODEINE 100-10 MG/5ML PO SYRP
5.0000 mL | ORAL_SOLUTION | ORAL | Status: DC | PRN
Start: 2020-01-01 — End: 2020-01-08
  Administered 2020-01-02 – 2020-01-06 (×3): 5 mL via ORAL
  Filled 2020-01-01 (×3): qty 5

## 2020-01-01 MED ORDER — ACETAMINOPHEN 325 MG PO TABS
650.0000 mg | ORAL_TABLET | Freq: Four times a day (QID) | ORAL | Status: DC | PRN
Start: 2020-01-01 — End: 2020-01-08
  Administered 2020-01-01: 650 mg via ORAL

## 2020-01-01 MED ORDER — SENNA 8.6 MG PO TABS
8.6000 mg | ORAL_TABLET | Freq: Every evening | ORAL | Status: DC
Start: 2020-01-01 — End: 2020-01-04
  Filled 2020-01-01: qty 1

## 2020-01-01 MED ORDER — ONDANSETRON 4 MG PO TBDP
4.0000 mg | ORAL_TABLET | Freq: Four times a day (QID) | ORAL | Status: DC | PRN
Start: 2020-01-01 — End: 2020-01-08

## 2020-01-01 NOTE — Progress Notes (Signed)
2345 Pt brought in room 2818 AOx4, on 6LNC, denies any pain, no CP, no SOB. Four-eye skin assessment done with Linet O., RN, scar noted on the abdomen, skin tear and excoriation noted in the sacrum and groin area. Heels are intact. Belongings at the bedside include footwear, clothing, cell phone. Fistula present on the left upper arm.  Will continue to monitor patient. Bed placed in the low position with call bell in reach, bed alarm on, and hourly rounding completed.  Opportunity for questions provided.

## 2020-01-01 NOTE — UM Notes (Signed)
[facility] Utilization Review   NPI #[id number], Tax ID [id number]  Please call Milas Gain MSN RN CCM @  [phone] with any questions or concerns.  Email:  [email or web address]  Fax final authorization and requests for additional information to [phone]    ER ADMIT DATE AND TIME: 12/31/2019  7:41 PM  IP ADMIT DATE:12/31/19 2221  Diagnosis: Pneumonia Due To Covid-19 Virus    Level of Care: Intermediate Care    Patient Class: Inpatient         INITIAL INPATIENT REVIEW FOR 12/31/19    PATIENT NAME: [name],[name] / [birth year] / AGE: 64 y.o.      History of present illness: Pt is a 64 y.o. female who arrived to [facility] (12/31/2019 at [place]) with C/OShortness of Breath    The pt has  a PMHx of ESRD-DD hypertension diabetes hyperlipidemia, COPD, Sleep Apnea . She came to the ED for evaluation of shortness of breath and hypoxia.  Patient reports that she has been having generalized body weakness and dry cough for the past 5 days.  Patient started to experience increased shortness of breath during hemodialysis today for which EMS was called and brought to the ED.  Patient not vaccinated for Covid.  [place] [facility] she was hypoxic at 88% on room air and placed on nasal cannula      V/S:      Vital Sign Min/Max (last 24 hours)    Value Min Max   Temp 98.4 F (36.9 C) 99.7 F (37.6 C)   Heart Rate 53Abnormal 81   Resp Rate 19 21   BP: Systolic 253 664   BP: Diastolic 69 86   SpO2 88 %Abnormal 98 %       Labs -  Ref Range & Units 1 d ago   Procalcitonin   0.00 - 0.10 0.77High          12/31/19  2012   Troponin I 0.06*     Component   Ref Range & Units 1 d ago   (12/31/19)   Hgb   11.4 - 14.8 g/dL 9.0Low    Hematocrit   34.7 - 43.7 % 29.9Low    RBC   3.90 - 5.10 x10 6/uL 3.13Low      Component   Ref Range & Units 1 d ago   (12/31/19)   Glucose   70 - 100 mg/dL 265High    BUN   7.0 - 19.0 mg/dL 33.0High    Creatinine   0.6 - 1.0 mg/dL 5.4High    Chloride   100 - 111 mEq/Young 92Low    Calcium    8.5 - 10.5 mg/dL 7.9Low    Albumin   3.5 - 5.0 g/dL 2.7Low    Alkaline Phosphatase   37 - 106 U/Young 121High    Anion Gap   5.0 - 15.0 19.0High              Radiologic Studies -   Chest AP Portable    Result Date: 12/31/2019  Bilateral hazy and consolidation throughout the peripheries of the midlungs and lung bases which represent atypical/viral pneumonia, or asymmetric pulmonary edema. Raphael Gibney, DO  12/31/2019 8:02 PM             ED meds:     Date/Time Order Dose Route Action Action by    12/31/2019 2255 piperacillin-tazobactam (ZOSYN) injection 4.5 g 4.5 g Intravenous Given         MD NOTES:  H&P  Anne Ng  Young Couchman is a 63 y.o. female admitted  with Pneumonia due to COVID-19 virus.    #Acute respiratory failure with hypoxia  -Presented for shortness of breath and hypoxia of 88%  -ABG showed pH of 7.4/PCO2 of 37/PO2 of 125/bicarb of 28  -Continue supplemental O2 by NC monitor O2 sat    #Bilateral pneumonia/COVID-19  -Chest x-ray showed bilateral haziness and consolidation  -Tested positive for COVID-19  -Maintain airborne/contact precaution  -CRP, ferritin, procalcitonin  -Decadron 6 mg IV x QD  -Patient given 1 dose of Zosyn in the ED  -ID consult in a.m.    #ESRD-DD  -Continue hemodialysis per schedule on TTS  -Obtain nephrology consult in a.m.    #Elevated troponin  -Mild likely demand ischemia in the setting of ESRD  -Twelve-lead EKG showed NSR and no ischemic ST or T wave changes  -Telemetry monitoring  -Trend troponin  -Continue home meds  -May consider cardiology consult in a.m.    #Hypertensive urgency  -Resume oral antihypertensive medications  -IV hydralazine as needed    #Anemia of CKD  -H/H stable  -Monitor CBC and transfuse PRBC as needed    #DM type II  -SSI  -Monitor fingersticks closely      GI Prophylaxis      Nutrition  Renal    DVT/VTE Prophylaxis  Heparin    Anticipated medical stability for discharge: 2 to 3 days  Service status/Reason for ongoing hospitalization:  Acute respiratory failure with hypoxia/COVID-19     Current Medications:   Scheduled Meds:  Current Facility-Administered Medications   Medication Dose Route Frequency    atorvastatin  40 mg Oral Daily    cefTRIAXone  1 g Intravenous Q24H    dexAMETHasone  6 mg Intravenous Daily    dexAMETHasone  10 mg Intravenous Once    fludrocortisone  0.1 mg Oral Daily    heparin (porcine)  5,000 Units Subcutaneous Q12H [facility]    insulin lispro  1-4 Units Subcutaneous QHS    insulin lispro  1-8 Units Subcutaneous TID AC    midodrine  10 mg Oral TID MEALS    pantoprazole  40 mg Oral QAM AC    senna  8.6 mg Oral QHS    sevelamer  800 mg Oral TID MEALS    vitamin C  500 mg Oral Daily

## 2020-01-01 NOTE — Plan of Care (Signed)
Problem: Compromised Hemodynamic Status  Goal: Vital signs and fluid balance maintained/improved  Outcome: Progressing  Flowsheets (Taken 01/01/2020 1414)  Vital signs and fluid balance are maintained/improved:   Position patient for maximum circulation/cardiac output   Monitor/assess vitals and hemodynamic parameters with position changes   Monitor intake and output. Notify LIP if urine output is less than 30 mL/hour.     Problem: Inadequate Gas Exchange  Goal: Adequate oxygenation and improved ventilation  Outcome: Progressing  Flowsheets (Taken 01/01/2020 1414)  Adequate oxygenation and improved ventilation:   Assess lung sounds   Provide mechanical and oxygen support to facilitate gas exchange   Increase activity as tolerated/progressive mobility   Monitor SpO2 and treat as needed   Pt a/o in bed, tends to nod "yes/no"{ more than use voice.  Tolerated am meds, assessment, IV abx.  IV dislodged.  Pt resting most of shift.  3.5 L/min nc, SpO2 between 88 (when displaced)-98%.

## 2020-01-01 NOTE — Plan of Care (Signed)
Pt denies any CP, no SOB, 97% on 3LNC, compliant to all care provided, had 3 loose BM during shift, skin kept clean and dry, moisture barrier applied, wound consult put in for extensive excoriation in the peri area, MD made aware. VS and labs monitored, safety precautions taken. RN will continue to monitor.    Problem: Compromised Tissue integrity  Goal: Damaged tissue is healing and protected  Outcome: Progressing  Flowsheets (Taken 01/01/2020 0459)  Damaged tissue is healing and protected:   Monitor/assess Braden scale every shift   Provide wound care per wound care algorithm   Relieve pressure to bony prominences for patients at moderate and high risk   Avoid shearing injuries   Keep intact skin clean and dry   Monitor external devices/tubes for correct placement to prevent pressure, friction and shearing   Encourage use of lotion/moisturizer on skin   Consult/collaborate with wound care nurse   Use incontinence wipes for cleaning urine, stool and caustic drainage. Foley care as needed   Use bath wipes, not soap and water, for daily bathing     Problem: Compromised Hemodynamic Status  Goal: Vital signs and fluid balance maintained/improved  Outcome: Progressing  Flowsheets (Taken 01/01/2020 0458)  Vital signs and fluid balance are maintained/improved:   Monitor/assess vitals and hemodynamic parameters with position changes   Monitor and compare daily weight   Monitor intake and output. Notify LIP if urine output is less than 30 mL/hour.   Monitor/assess lab values and report abnormal values   Position patient for maximum circulation/cardiac output     Problem: Ineffective Airway Clearance  Goal: Airway is maintained  Outcome: Progressing  Flowsheets (Taken 01/01/2020 0458)  Airway is maintained:   Teach/Reinforce use of incentive spirometer 10 times per hour while awake, cough and deep breath as needed   Maintain oxygen level of greater than 92% or as ordered by LIP (reworded)     Problem: Inadequate Gas  Exchange  Goal: Adequate oxygenation and improved ventilation  Outcome: Progressing  Flowsheets (Taken 01/01/2020 0459)  Adequate oxygenation and improved ventilation:   Assess lung sounds   Monitor SpO2 and treat as needed   Provide mechanical and oxygen support to facilitate gas exchange   Position for maximum ventilatory efficiency   Teach/reinforce use of incentive spirometer 10 times per hour while awake, cough and deep breath as needed   Increase activity as tolerated/progressive mobility

## 2020-01-01 NOTE — Progress Notes (Addendum)
HOSPITALIST PROGRESS NOTE  [facility]      Patient: [name]  Date: 01/01/2020    LOS: 1 Days  Admission Date: 12/31/2019   MRN: [id number]  Attending: Murlean Hark, MD  , MPH                    Please contact via [place]: [phone]                    Pager: [phone]     ASSESSMENT/PLAN     [name] is a 64 y.o. female admitted with Pneumonia due to COVID-19 virus    Interval Summary:     [name]    Diagnosis    Diarrhea    Pneumonia due to COVID-19 virus    History of CVA (cerebrovascular accident)    History of COPD    Hyperlipidemia    Hypertension    GERD (gastroesophageal reflux disease)    Type 2 diabetes mellitus, with long-term current use of insulin    ESRD (end stage renal disease) on dialysis    Iron deficiency anemia secondary to inadequate dietary iron intake       Patient Active Hospital Problem List:   Pneumonia due to COVID-19 virus (12/31/2019)   History of COPD (06/23/2019)    Assessment: No history of chronic respiratory failure.  Patient is currently on 1 to 2 L of oxygen to maintain sats above 90%.  I noted her to be desat into the mid 80s when the oxygen cannula was displaced from her nares.    Plan: Continue with ceftriaxone 1 g every 24 hour  Continue with dexamethasone 6 mg IV daily  Recommend Robitussin with codeine for cough  Not on Remdesivir on account of end-stage renal disease on hemodialysis; may consider starting a lower dose after discussions with ID  Continue airborne and contact isolation per Covid protocol    Diarrhea   Assessment-patient admits to having several watery bowel movements through the course of the day and this has been going on for the past 7 days  Plan-send C. difficile   Ordered stool WBC  Ordered stool Shigella Salmonella Campylobacter and Cryptosporidium  Started empiric vancomycin 125 mg p.o. every 6 hours  Started Questran 1 pack twice daily     Iron deficiency anemia  secondary to inadequate dietary iron intake (05/14/2019)    Assessment: Chronic and stable    Plan: IV iron versus Aranesp per nephrology     ESRD (end stage renal disease) on dialysis (05/24/2019)    Assessment: Dialysis days are Monday Wednesdays and Fridays    Plan: We will request next dialysis session tomorrow-Friday     Type 2 diabetes mellitus, with long-term current use of insulin (05/31/2019)    Assessment: Chronic with unknown control    Plan: Add on hemoglobin A1c  Sliding scale lispro before every meal and at bedtime  Lantus 20 units nightly     GERD (gastroesophageal reflux disease) (05/31/2019)    Assessment: Chronic and appears to be well controlled    Plan: Continue with Protonix 40 mg daily     Hypertension (06/23/2019)    Assessment: Chronic with unclear list of medications at home    Plan: Home medications to be reconciled  For now continue with midodrine 10 mg.  3 times daily  Hyperlipidemia (06/23/2019)    Assessment: Chronic and unknown control    Plan: Continue with home dose of Lipitor 40 mg daily     History of CVA (cerebrovascular accident) (07/04/2019)    Assessment: Chronic with no acute deficits    Plan: Continue with Lipitor 40 mg daily    Analgesia: Tylenol    Nutrition: Consistent carbohydrate diet    GI Prophylaxis: Protonix    DVT Prophylaxis: Heparin    Code Status: Full code    DISPO: Anticipate discharge in 1 to 2 days       [place] states that she is feeling somewhat better.  As per the bedside nurse, patient was somewhat somnolent and less interactive this morning but to the course of the day she has shown improvement and appears to be feeling much better this evening.    Febrile- None   Eating-poorly due to lack of appetite  Coughing- None   Diarrhea- None   Sleeping - Well   Voiding - Well        MEDICATIONS     Current Facility-Administered Medications   Medication Dose Route Frequency    atorvastatin  40 mg Oral Daily    cefTRIAXone  1 g Intravenous  Q24H    dexAMETHasone  6 mg Intravenous Daily    dexAMETHasone  10 mg Intravenous Once    fludrocortisone  0.1 mg Oral Daily    heparin (porcine)  5,000 Units Subcutaneous Q12H [place]    insulin lispro  1-4 Units Subcutaneous QHS    insulin lispro  1-8 Units Subcutaneous TID AC    midodrine  10 mg Oral TID MEALS    pantoprazole  40 mg Oral QAM AC    senna  8.6 mg Oral QHS    sevelamer  800 mg Oral TID MEALS    vitamin C  500 mg Oral Daily       PHYSICAL EXAM     Vitals:    01/01/20 0737   BP: 146/76   Pulse: 78   Resp: 21   Temp: 99.7 F (37.6 C)   SpO2: 97%       Temperature: Temp  Min: 98.4 F (36.9 C)  Max: 99.7 F (37.6 C)  Pulse: Pulse  Min: 53  Max: 81  Respiratory: Resp  Min: 19  Max: 21  Non-Invasive BP: BP  Min: 146/76  Max: 182/86  Pulse Oximetry SpO2  Min: 88 %  Max: 98 %    Intake and Output Summary (Last 24 hours) at Date Time    Intake/Output Summary (Last 24 hours) at 01/01/2020 0916  Last data filed at 12/31/2019 2345  Gross per 24 hour   Intake 10 ml   Output    Net 10 ml       Physical Exam  Constitutional:       General: She is not in acute distress.     Appearance: She is not diaphoretic.   HENT:      Head: Normocephalic and atraumatic.      Right Ear: External ear normal.      Left Ear: External ear normal.   Eyes:      Pupils: Pupils are equal, round, and reactive to light.   Neck:      Thyroid: No thyromegaly.      Vascular: No JVD.      Trachea: No tracheal deviation.   Cardiovascular:      Rate and Rhythm: Normal  rate and regular rhythm.      Heart sounds: Normal heart sounds. No murmur heard.  No friction rub. No gallop.    Pulmonary:      Effort: Pulmonary effort is normal. No respiratory distress.      Breath sounds: No stridor. Rhonchi present. No wheezing or rales.      Comments: Course rhonchorous breath sounds when patient attempts to cough  Chest:      Chest wall: No tenderness.   Abdominal:      General: Bowel sounds are normal. There is no distension.      Palpations:  Abdomen is soft. There is no mass.      Tenderness: There is no abdominal tenderness. There is no guarding or rebound.   Musculoskeletal:         General: No tenderness or deformity. Normal range of motion.      Cervical back: Normal range of motion and neck supple.   Lymphadenopathy:      Cervical: No cervical adenopathy.   Skin:     General: Skin is warm and dry.      Coloration: Skin is not pale.      Findings: No erythema or rash.   Neurological:      Mental Status: She is alert and oriented to person, place, and time.      Cranial Nerves: No cranial nerve deficit.      Motor: No abnormal muscle tone.      Coordination: Coordination normal.      Gait: Gait is intact.      Deep Tendon Reflexes: Reflexes are normal and symmetric.   Psychiatric:         Mood and Affect: Mood and affect normal.         Cognition and Memory: Memory normal.         Judgment: Judgment normal.       LABS     Recent Labs   Lab 01/01/20  0526 12/31/19  2012   WBC 4.30 5.41   RBC 2.56* 3.13*   Hgb 7.4* 9.0*   Hematocrit 24.7* 29.9*   MCV 96.5* 95.5   Platelets 148 174       Recent Labs   Lab 01/01/20  0051 12/31/19  2012   Sodium 138 136   Potassium 3.7 3.7   Chloride 94* 92*   CO2 25 25   BUN 38.0* 33.0*   Creatinine 5.9* 5.4*   Glucose 244* 265*   Calcium 7.9* 7.9*       Recent Labs   Lab 12/31/19  2012   ALT 10   AST (SGOT) 20   Bilirubin, Total 0.4   Albumin 2.7*   Alkaline Phosphatase 121*       Recent Labs   Lab 01/01/20  0526 01/01/20  0051 12/31/19  2012   Troponin I 0.05 0.05 0.06*             Microbiology Results (last 15 days)     Procedure Component Value Units Date/Time    COVID-19 (SARS-CoV-2) and Influenza A/B, NAA (Liat Rapid) [[id number]]  (Abnormal) Collected: 12/31/19 2054    Order Status: Completed Specimen: Culturette from Nasopharyngeal Updated: 12/31/19 2213     Purpose of COVID testing Diagnostic -PUI     SARS-CoV-2 Specimen Source Nasopharyngeal     SARS CoV 2 Overall Result Detected     Comment: Positive COVID  results called to Dr.He.  Readback confirmed by [id number] at 22:13  12/31/2019  Test performed using the Roche cobas Liat SARS-CoV-2 assay. This assay is  only for use under the Food and Drug Administrations Emergency Use  Authorization. This is a real-time RT-PCR assay for the qualitative  detection of SARS-CoV-2 RNA. Viral nucleic acids may persist in vivo,  independent of viability. Detection of viral nucleic acid does not imply the  presence of infectious virus, or that virus nucleic acid is the cause of  clinical symptoms. Negative results do not preclude SARS-CoV-2 infection and  should not be used as the sole basis for diagnosis, treatment or other  patient management decisions. Negative results must be combined with  clinical observations, patient history, and/or epidemiological information.  Invalid results may be due to inhibiting substances in the specimen and  recollection should occur. Please see Fact Sheets for patients and providers  located:  https://www.benson-chung.com/.          Influenza A Not Detected     Comment: Influenza A  NOT Detected results should be considered presumptive in  samples that have a positive SARS-CoV-2 (COVID-19) result. If co-infection  with influenza A virus is suspected and influenza virus detection would  change clinical management, please order influenza specific testing.          Influenza B Not Detected     Comment: Influenza B NOT Detected results should be considered presumptive in samples  that have a positive SARS-CoV-2 (COVID-19) result. If co-infection with  influenza B virus is suspected and influenza virus detection would change  clinical management, please order influenza specific testing.  Test performed using the Roche cobas Liat SARS-CoV-2 & Influenza A/B assay.  This assay is only for use under the Food and Drug Administrations  Emergency Use Authorization. This is a multiplex real-time RT-PCR assay  intended for the simultaneous in vitro  qualitative detection and  differentiation of SARS-CoV-2, influenza A, and influenza B virus RNA. Viral  nucleic acids may persist in vivo, independent of viability. Detection of  viral nucleic acid does not imply the presence of infectious virus, or that  virus nucleic acid is the cause of clinical symptoms. Negative results do  not preclude SARS-CoV-2, influenza A, and/or influenza B infection and  should not be used as the sole basis for diagnosis, treatment or other  patient management decisions. Negative results must be combined with  clinical observations, patient history, and/or epidemiological information.  Invalid results may be due to inhibiting substances in the specimen and  recollection should occur. Please see Fact Sheets for patients and providers  located: http://olson-hall.info/.         Narrative:      o Collect and clearly label specimen type:  o PREFERRED-Upper respiratory specimen: One Nasopharyngeal  Swab in Transport Media.  o Hand deliver to laboratory ASAP  Diagnostic -PUI    Blood Culture #1 [[id number]] Collected: 12/31/19 2012    Order Status: Sent Specimen: Arm from Blood, Venipuncture Updated: 01/01/20 0223    Narrative:      1 BLUE+1 PURPLE    Blood Culture #2 [[id number]] Collected: 12/31/19 2012    Order Status: Sent Specimen: Arm from Blood, Venipuncture Updated: 01/01/20 0223    Narrative:      1 BLUE+1 PURPLE           Chest AP Portable    Result Date: 12/31/2019  Bilateral hazy and consolidation throughout the peripheries of the midlungs and lung bases which represent atypical/viral pneumonia, or asymmetric pulmonary edema. Raphael Gibney, DO  12/31/2019 8:02 PM  Echo Results     None          I have personally reviewed the patient's labs, medications, and imaging    Total visit time = 45 mins ; more than 50% spent counseling/coordinating care    Signed,    Murlean Hark MD, MPH  9:16 AM 01/01/2020   Please contact via [place]: [phone]  Pager:  [phone]       This chart was generated using a medical voice-recognition software which does not employ spell-checking or grammar-checking features. It was dictated, all or in part, in a busy and often noisy patient care environment. I have taken all usual measures to dictate carefully and to review all aspects of this chart. Nonetheless, given the known and well-documented performance characteristics of voice recognition software in such patient care environments, this dictation still may contain unrecognized and wholly unintended errors or omissions.

## 2020-01-01 NOTE — Consults (Signed)
Wound Ostomy Continence Consultation    Date Time: 01/01/20 4:50 PM  Patient Name: [name], [name]  Consulting Service: [facility]  Reason for Consult: Incontinence/Moisture    Assessment & Plan     Assessment:      MASD- Gluteal cleft,   perirectal, perineal area, bilateral groin, - the wound has partial thickness skin loss,  erythematous noted underneath. Peri-wound macerated and denuded. Moisture-associated skin damage due to the moisture trap, functional incontinence of the bowel and bladder.    Recommendation/Plan:     Skin care: Gluteal cleft, sacrum, bilateral buttocks, perirectal, perineal area, bilateral groin,    1. Clean affected area with wound cleanser/peri-wipes every 6 hours and PRN.  2. Apply thin layer Baza (miconazole) cream every 6 hours and as needed with each soilage episode.  3. Leave open to the air, DO NOT USE FOAM DRESSING  4. Please do not remove all of the Baza cream with each treatment; leave a thin white layer and apply more cream as needed.     Baza Antifungal (miconazole nitrate 2%) is a moisture barrier cream that inhibits fungal growth and treats candidiasis, jock itch, ringworm, and athletes foot - also provides a moisture barrier against urine and feces. It has skin conditioners and is CHG compatible.    [name] is ordered from the pharmacy.    Initiate/ continue pressure prevention bundle:    Head of the bed 30 degrees or less  Positioning device to the bedside  Eliminate/minimize pressure from the area  Float heels with boots or pillows  Turn patient  Pressure redistribution cushion to the chair  Use lift sheets/low friction surface sheets for positioning  Pad bony prominences  Nutrition consult/Optimize nutrition  Initiate bed algorithm/Specialty bed  Moisture/Incontinence management - Cleanse with incontinence cleansing wipe/water to manage incontinence and to protect skin from exposure to urine and stool. Apply skin barrier protection cream. Apply [place]/external or Foley catheter  to prevent urinary contamination of the wound. Apply rectal pouch/fecal management system per unit policy, to prevent fecal contamination of a wound or to contain diarrhea.                      Wound care orders entered into EMR. Care rendered. Please re-consult the [place] Nurse team if the wound deteriorates or further assistance required.        Objective Findings   Specialty Bed:     [place]    Braden Score: Braden Scale Score: 17 (01/01/20 0920)      Wound Assessment:   Wound 01/01/20 Moisture Associated Skin Damage (MASD) Perineum Mid;Inner (Active)   Site Description Pink;Red 01/01/20 0452   Peri-wound Description Dry 01/01/20 0452   Treatments Site care 01/01/20 0030   Dressing Moisture Barrier;Open to air 01/01/20 0452   Dressing Changed New 01/01/20 0030              History of Presenting Illness   HPI:   [name] is a 64 y.o. female admitted with Pneumonia due to COVID-19 virus.  Note from Dr. Mia Creek is a 64 y.o. female with a PMHx of ESRD-DD hypertension diabetes hyperlipidemia and others listed below came to the ED for evaluation of shortness of breath and hypoxia.  Patient reports that she has been having generalized body weakness and dry cough for the past 5 days.  Patient started to experience increased shortness of breath during hemodialysis today for which EMS was called and brought to the ED.  Patient not vaccinated for Covid.  She denies fever,  chest pain, palpitation, nausea, vomiting.  [place] [facility] she was hypoxic at 88% on room air and placed on nasal cannula.  Review of blood work showed H/H of 9/30, glucose of 265, BUN of 33, creatinine of 5.4, troponin of 0.06, positive for rapid Covid testing.  Chest x-ray showed bilateral hazy consolidation at the lung bases and midlung     Past Medical and Surgical History     Past Medical History:   Diagnosis Date    Chronic obstructive pulmonary disease     Diabetes mellitus     Hyperlipidemia     Hypertension      Sleep apnea 2018       Past Surgical History:   Procedure Laterality Date    EGD, BIOPSY N/A 05/14/2019    Procedure: EGD, BIOPSY;  Surgeon: Ronie Spies, MD;  Location: [facility];  Service: Gastroenterology;  Laterality: N/A;    EXPLORATORY LAPAROTOMY N/A 05/27/2019    Procedure: EXPLORATORY LAPAROTOMY;  Surgeon: Herbert Moors., MD;  Location: [facility];  Service: General;  Laterality: N/A;    JOINT REPLACEMENT      shoulder    JOINT REPLACEMENT      knee    OVARY SURGERY Left     Removal    REMOVAL, FOREIGN BODY N/A 05/27/2019    Procedure: REMOVAL, PERITONEAL DIALYSIS CATHETER;  Surgeon: Herbert Moors., MD;  Location: [facility];  Service: General;  Laterality: N/A;    TUNNELED CATH CHECK/CHANGE (PERMCATH) N/A 07/03/2019    Procedure: TUNNELED CATH CHECK/CHANGE;  Surgeon: Maureen Ralphs, MD;  Location: [facility];  Service: Interventional Radiology;  Laterality: N/A;    TUNNELED CATH PLACEMENT (PERMCATH) Bilateral 05/23/2019    Procedure: TUNNELED CATH PLACEMENT;  Surgeon: Jacques Earthly, MD;  Location: [facility];  Service: Interventional Radiology;  Laterality: Bilateral;    TUNNELED CATH PLACEMENT (PERMCATH) Right 07/31/2019    Procedure: TUNNELED CATH PLACEMENT;  Surgeon: Jacques Earthly, MD;  Location: [facility];  Service: Interventional Radiology;  Laterality: Right;     POD: @ORDAYSPST  @LOS :  LOS: 1 day         Lab   Significant Lab Values:    Recent Labs     01/01/20  0526 01/01/20  0051 12/31/19  2012 12/31/19  2012   WBC 4.30  --    < > 5.41   RBC 2.56*  --    < > 3.13*   Hgb 7.4*  --    < > 9.0*   Hematocrit 24.7*  --    < > 29.9*   Sodium  --  138   < > 136   Potassium  --  3.7   < > 3.7   Chloride  --  94*   < > 92*   CO2  --  25   < > 25   BUN  --  38.0*   < > 33.0*   Creatinine  --  5.9*   < > 5.4*   Calcium  --  7.9*   < > 7.9*   Albumin  --   --   --  2.7*   EGFR  --  8.7   < > 9.6   Glucose  --  244*   < > 265*    < > = values in this interval not  displayed.    Prealbumin: No components found for: PREALBUMIN  Culture  Thank you for this consultation    Ryelan Kazee "Sunshine" Rosela Supak BSN, RN, [employer]  Wound, Ostomy, and Continence Nurse Coordinator  [facility]  [address] [place], [place] [address]  T [phone] S [phone]  [email or web address]

## 2020-01-01 NOTE — Nursing Progress Note (Signed)
FOUR EYES SKIN ASSESSMENT NOTE      POC Initiated for Risk for Altered Skin Yes    Patient Assessed for Correct Mattress Surface Yes  *At risk patients with Braden Score less than 12 must be considered for specialty bed    Mepilex or Adhesive Foam Dressing applied to sacrum/heel if any PI risk factors present: No      If Wound/Pressure Injury present:    Wound/PI assessment documented in EHR: Yes    Admitting physician notified: Yes    Wound consult ordered: Yes      Second RN/PCT Name: Jerene Canny RN

## 2020-01-01 NOTE — Progress Notes (Signed)
Nutritional Support Services  Nutrition Assessment    [name] 64 y.o. female   MRN: [id number]    Summary of Nutrition Recommendations:  1. Change diet to consistent CHO given hyperglycemia    2. Add one Glucerna, PO, daily to optimize PO intake   Each Glucerna shake provides 220 kcal and 10 gm protein.    3. Daily weight - standing scale if appropriate    -----------------------------------------------------------------------------------------------------------------    D/w RN                                                        Assessment Data:   Referral Source: RN screen  Reason for Referral: MST 2 for unsure wt loss    COVID 19/ PUI Isolation Precautions noted.  Personal Protective Equipment (PPE) donned: procedure gown, reusable respirator with procedural mask, face shield, gloves    Nutrition: pt seen at bedside, on airborne/contact isolation d/t COVID-19 infection. AOx4 per RN report. Pt sleeping but easily arousable, however, pt fell back to sleep after a few interview questions. Pt generally responded to questions with gestures and shaking/nodding head, minimal verbal response. Pt reported decreased appetite x 2 days PTA and reported appetite remained poor since admission. Per RN, pt has no overt GI distress, last BM 12/9, no s/s aspiration.    Learning Needs: none at this time given pt fell asleep. Will attach diabetic diet handout to AVS tabs    Hospital Admission: 64 y.o. female with a PMHx of ESRD-DD on HD (TTS) and DM admitted with SOB and hypoxia. COVID-19 infection.     Medical Hx:  has a past medical history of Chronic obstructive pulmonary disease, Diabetes mellitus, Hyperlipidemia, Hypertension, and Sleep apnea (2018).    PSH: has a past surgical history that includes Ovary surgery (Left); Joint replacement; Joint replacement; EGD, BIOPSY (N/A, 05/14/2019); Tunneled Cath Placement (Permcath) (Bilateral, 05/23/2019); REMOVAL, FOREIGN BODY (N/A, 05/27/2019); EXPLORATORY LAPAROTOMY (N/A,  05/27/2019); Tunneled Cath Check/Change (Permcath) (N/A, 07/03/2019); and Tunneled Cath Placement (Permcath) (Right, 07/31/2019).     Orders Placed This Encounter   Procedures   . Diet heart healthy     Intake: per pt, 50% breakfast this AM    ANTHROPOMETRIC  Height: 157.5 cm (5' 2.01")  Weight: 92.6 kg (204 lb 2.3 oz)     Weight Change: -5.79              Body mass index is 37.33 kg/m.     Ideal body weight: 50.1 kg (110 lb 7.9 oz)  Adjusted ideal body weight: 67.1 kg (147 lb 15.2 oz)    Weight Monitoring Weight Weight Method   05/12/2019 112.038 kg Bed Scale   05/14/2019 120.1 kg Bed Scale   05/15/2019 117.9 kg Bed Scale   05/16/2019 120.3 kg Bed Scale   05/17/2019 120.249 kg Bed Scale   05/18/2019 122.2 kg Bed Scale   05/19/2019 122 kg Bed Scale   05/20/2019 121.3 kg Bed Scale   05/22/2019 121 kg Bed Scale   05/23/2019 112.038 kg Bed Scale   05/24/2019 120.1 kg Bed Scale   05/25/2019 118 kg Bed Scale   05/26/2019 120.7 kg Bed Scale   05/28/2019 113.399 kg Bed Scale   05/29/2019 113.399 kg Bed Scale   05/30/2019 106.9 kg Bed Scale   05/31/2019 113.218 kg Bed Scale  06/01/2019 112.946 kg Bed Scale   06/02/2019 113.399 kg Bed Scale   06/04/2019 111.9 kg Bed Scale   06/10/2019 119.7 kg Bed Scale   06/11/2019 119 kg Bed Scale   06/23/2019 106.731 kg Standing Scale   06/24/2019 111.4 kg Bed Scale   06/26/2019 111.449 kg Bed Scale   07/01/2019 105.235 kg Bed Scale   07/02/2019 111.131 kg Bed Scale   07/03/2019 99.746 kg Standing Scale   07/04/2019 104.327 kg Standing Scale   07/07/2019 112.7 kg Bed Scale   07/08/2019 112.7 kg Bed Scale   07/09/2019 110.1 kg Bed Scale   07/11/2019 111.131 kg Bed Scale   07/12/2019 111.2 kg Bed Scale   07/12/2019 111.131 kg Bed Scale   07/14/2019 111.131 kg Bed Scale   07/15/2019 109 kg Bed Scale   07/16/2019 106.9 kg Bed Scale   07/18/2019 107.6 kg Bed Scale   07/21/2019 107.321 kg Standing Scale   07/22/2019 107.2 kg Bed Scale   07/23/2019 108.8 kg Bed Scale   07/26/2019 108.8 kg    12/31/2019 98.294 kg Bed Scale   12/31/2019 92.6 kg      Weight  History Summary: per charted wt, pt either had 13.7% or 8.4% wt loss in 6 months depending on which wt to use from yesterday. Overall wt fluctuation x 8 months, no definite up/downtrend, suspect r/t fluid loss/accumulation given pt on HD. Will monitor daily weight for trend    Physical Assessment: 12/8, pt with no overt muscle/fat loss per observation only  NFPE: deferred d/t pt fell asleep  Edema: +1 BUE per flowsheets  Skin: MASD (perineum) per flowsheets  GI function: soft, non distended, +BS, last BM 12/9 per flowsheets    ESTIMATED NEEDS    Total Daily Energy Needs: 1574.2 to 2037.2 kcal  Method for Calculating Energy Needs: 17 kcal - 22 kcal per kg  at 92.6 kg (0 body weight)  Rationale: obese, non ICU, ESRD on HD       Total Daily Protein Needs: 75.15 to 100.2 g  Method for Calculating Protein Needs: 1.5 g - 2 g per kg at 50.1 kg (Ideal body weight)  Rationale: obese, non ICU, ESRD on HD      Total Daily Fluid Needs: 1574.2 to 2037.2 ml  Method for Calculating Fluid Needs: 1 ml per kcal energy = 1574.2 to 2037.2 kcal  Rationale: BMI, age, renal OR per MD adjustment      Pertinent Medications: rocephin, humalog, protonix, sevelamer, vit C    Pertinent labs: BG 230 - 244 x 224 hrs  Recent Labs   Lab 01/01/20  0526 01/01/20  0051 12/31/19  2012   BUN  --  38.0* 33.0*   Creatinine  --  5.9* 5.4*   Glucose  --  244* 265*   EGFR  --  8.7 9.6   Hemoglobin A1C 10.2*  --   --                                                                Nutrition Diagnosis      Inadequate Protein-energy intake related to decreased appetite 2/2 COVID-19 infection and increased nutrient needs (kcal/protein) 2/2 ESRD on HD as evidenced by decreased PO intake x 2 days PTA per pt report, 50% meal  x1 this AM - new      Altered Nutrition related lab values related to DM2 as evidenced by Hgb A1C 10.2% on 12/9 and BG 230 - 244 x 224 hrs - new                                                              Intervention     Nutrition  recommendation - Please refer to top of note                                                               Monitoring/Evaluation     Goals:     1. Pt will consume >75% of estimated needs via meals and ONS at f/u - new      Nutrition Risk Level: High (will follow up at least 2 times per week and PRN)     Mcneil Sober) [place], [place], [place]  Clinical Dietitian  X [phone]

## 2020-01-01 NOTE — Progress Notes (Signed)
Case Management Initial Assessment and Discharge Planning:    Situation Idpa completed with daughter. CM left message with patient and called room. No answer.     Background PMHx of ESRD-DD hypertension diabetes hyperlipidemia and others listed below came to the ED for evaluation of shortness of breath and hypoxia.     Assessment Patient was from home with her husband. They live in a senior home currently. Patient had a recent fracture and patients daughter stated someone was going there to help her. "to clean". CM asked if she had home health before and she thought maybe she did. Patient has been to a SNF before but they did not remember the name. Patient has a walker, wheel chair, bp cuff, and cane at home. She takes medicaid transport to dialysis tts. Husband can take patient home from hospital or family. She has a pcp but has not seen them and wanted to switch mds.      Recommendation   Home with [facility] and family to transport. Unless patient is suggested for SNF by therapy. Patient will need TCM appointment likely.        01/01/20 1429   Patient Type   Within 30 Days of Previous Admission? No   Healthcare Decisions   Interviewed: Family   Name of interviewee if other than the pt: [place] daughter   Valerie Roys Information: [phone]   Orientation/Decision Making Abilities of Patient Alert and Oriented x3, able to make decisions   Advance Directive Patient does not have advance directive   Prior to admission   Prior level of function Ambulates with assistive device   Type of Residence Private residence;Other (Comment)  (senior homes)   Home Layout One level   Have running water, electricity, heat, etc? Yes   Living Arrangements Spouse/significant other   How do you get to your MD appointments? medicaid transport   How do you get your groceries? Husband   Who fixes your meals? self   Who does your laundry? self   Who picks up your prescriptions? self/husband   Dressing Independent   Grooming Independent    Feeding Independent   Bathing Needs assistance   Toileting Independent   DME Currently at [employer] [employer];Wheelchair, Manual;Cane, Single Point;BP Cuff   Home Care/Community Services Other (Comment)  ([facility])   Prior SNF admission? (Detail) yes does not remember name   Prior Rehab admission? (Detail) no   Discharge Planning   Support Systems Spouse/significant other;Children;Family members   Patient expects to be discharged to: home   Anticipated [place] plan discussed with: Same as interviewed   Mode of transportation: Private car (family member)   Does the patient have perscription coverage? Yes   Consults/Providers   PT Evaluation Needed 2   OT Evalulation Needed 2   SLP Evaluation Needed 2   Correct PCP listed in Epic? Yes   Family and PCP   PCP on file was verified as the current PCP? PCP Unknown/Unable to obtain   In case you are admitted, would like family notified? Yes   Name of family member to be notified shannon and xavier   Mitzi Hansen, MHA, [place], CM  Case Manager [facility]

## 2020-01-02 LAB — GLUCOSE WHOLE BLOOD - POCT
Whole Blood Glucose POCT: 190 mg/dL — ABNORMAL HIGH (ref 70–100)
Whole Blood Glucose POCT: 192 mg/dL — ABNORMAL HIGH (ref 70–100)
Whole Blood Glucose POCT: 242 mg/dL — ABNORMAL HIGH (ref 70–100)
Whole Blood Glucose POCT: 358 mg/dL — ABNORMAL HIGH (ref 70–100)
Whole Blood Glucose POCT: 431 mg/dL — ABNORMAL HIGH (ref 70–100)

## 2020-01-02 LAB — PTH, INTACT: PTH Intact: 867.4 pg/mL — ABNORMAL HIGH (ref 17.7–84.5)

## 2020-01-02 LAB — STOOL FOR WBC
Eosinophils Wright Stain: NONE SEEN
Lymphocytes Wright Stain: NONE SEEN
Neutrophils Wright Stain: NONE SEEN
RBC Wright Stain: NONE SEEN

## 2020-01-02 LAB — BASIC METABOLIC PANEL
Anion Gap: 14 (ref 5.0–15.0)
BUN: 55 mg/dL — ABNORMAL HIGH (ref 7.0–19.0)
CO2: 27 mEq/L (ref 22–29)
Calcium: 7.7 mg/dL — ABNORMAL LOW (ref 8.5–10.5)
Chloride: 99 mEq/L — ABNORMAL LOW (ref 100–111)
Creatinine: 7.7 mg/dL — ABNORMAL HIGH (ref 0.6–1.0)
Glucose: 208 mg/dL — ABNORMAL HIGH (ref 70–100)
Potassium: 4.1 mEq/L (ref 3.5–5.1)
Sodium: 140 mEq/L (ref 136–145)

## 2020-01-02 LAB — CBC
Absolute NRBC: 0 10*3/uL (ref 0.00–0.00)
Hematocrit: 28 % — ABNORMAL LOW (ref 34.7–43.7)
Hgb: 8.2 g/dL — ABNORMAL LOW (ref 11.4–14.8)
MCH: 28.7 pg (ref 25.1–33.5)
MCHC: 29.3 g/dL — ABNORMAL LOW (ref 31.5–35.8)
MCV: 97.9 fL — ABNORMAL HIGH (ref 78.0–96.0)
MPV: 11.4 fL (ref 8.9–12.5)
Nucleated RBC: 0 /100 WBC (ref 0.0–0.0)
Platelets: 190 10*3/uL (ref 142–346)
RBC: 2.86 10*6/uL — ABNORMAL LOW (ref 3.90–5.10)
RDW: 14 % (ref 11–15)
WBC: 3.24 10*3/uL (ref 3.10–9.50)

## 2020-01-02 LAB — HEMOLYSIS INDEX
Hemolysis Index: 1 (ref 0–18)
Hemolysis Index: 2 (ref 0–18)

## 2020-01-02 LAB — PHOSPHORUS: Phosphorus: 5.5 mg/dL — ABNORMAL HIGH (ref 2.3–4.7)

## 2020-01-02 LAB — STOOL FOR SALMONELLA,SHIGELLA,CAMPYLOBACTER AND SHIGA TOXIN PCR
Stool Campylobacter jejunii/coli by PCR: NEGATIVE
Stool Salmonella Species by PCR: NEGATIVE
Stool Shiga Toxin by PCR: NEGATIVE
Stool Shigella Species/Enteroinvasive Escherichia coli PCR: NEGATIVE

## 2020-01-02 LAB — GFR: EGFR: 6.4

## 2020-01-02 LAB — HEPATITIS B SURFACE ANTIGEN W/ REFLEX TO CONFIRMATION: Hepatitis B Surface Antigen: NONREACTIVE

## 2020-01-02 MED ORDER — ALBUMIN HUMAN 25 % IV SOLN
100.0000 mL | INTRAVENOUS | Status: AC | PRN
Start: 2020-01-02 — End: 2020-01-02

## 2020-01-02 MED ORDER — SODIUM CHLORIDE 0.9 % IV BOLUS
250.0000 mL | INTRAVENOUS | Status: AC | PRN
Start: 2020-01-02 — End: 2020-01-02

## 2020-01-02 MED ORDER — SODIUM CHLORIDE 0.9 % IV BOLUS
100.0000 mL | INTRAVENOUS | Status: AC | PRN
Start: 2020-01-02 — End: 2020-01-02

## 2020-01-02 NOTE — Progress Notes (Signed)
Pt is covid +,  PPE done per protocol.  Pt is alert and oriented.  Pt denied pain and sob. LAVF Access is optimally working.  Timeouts and safety checks done. Will closely monitor the pt.       01/02/20 0950   Bedside Nurse Communication   Name of bedside RN - pre dialysis Ofilia Neas, RN   Treatment Initiation- With Dialysis Precautions   Time Out/Safety Check Completed Yes   Consent for HD signed for this hospitalization (Date) 05/23/19   Consent for HD signed for this hospitalization (Time) 1044   Blood Consent Verified N/A   Dialysis Precautions All Connections Secured;Saline Line Double Clamped;Venous Parameters Set;Arterial Parameters Set;Air Foam Detecctor Engaged   Dialysis Treatment Type Bedside   Special Considerations Covid +    Is patient diabetic? Yes   RO/Hemodialysis Architectural technologist   Is Total Chlorine less than 0.1 ppm? Yes   Orignial Total Chlorine Testing Time 0905   At 4 Hour Total Chlorine Testing Time 1305   RO/Hemodialysis Facilities manager Number 4    Machine Serial Number [id number]   RO # 25   RO Serial # [id number]   pH 7.2   Pressure Test Verified Yes   Alarms Verified Passed   Machine Temperature 98.6 F (37 C)   Alarms Verified Yes   Na+ mEq (Machine) 138 mEq   Bicarb mEq (Machine) 35 mEq   Hemodialysis Conductivity (Machine) 13.8   Hemodialysis Conductivity (Meter) 13.8   Dialyzer Lot Number [id number]   Tubing Lot Number [id number]   RO Machine Log Completed Yes   Hepatitis Status   HBsAg (Antigen) Result Unknown   HBsAg Date Drawn 01/02/20   Dialysis Weight   Pre-Treatment Weight (Kg)   (UTW, bedscale not properly working)   Vitals   Temp 98 F (36.7 C)   Heart Rate 67   Resp Rate 20   BP 165/71   SpO2 98 %   O2 Device Nasal cannula   Assessment   Mental Status Alert;Oriented   Respiratory Pattern Regular   Bilateral Breath Sounds Diminished   Edema  WDL   General Skin Color Appropriate for ethnicity   Skin Condition/Temp Warm;Dry   Abdomen Inspection Nondistended    Mobility Bed   Graft/Fistula Left   No placement date or time found.   Access Type: Arteriovenous Fistula  Orientation: Left  Graft/Fistula Location: Upper Arm   Fistula/ Graft Assessment Abnormalities WDL   Pain Assessment   Charting Type Assessment   Pain Scale Used Numeric Scale (0-10)   Numeric Pain Scale   Pain Score 0   POSS Score 1   Hemodialysis Comments   Pre-Hemodialysis Comments Timeout and safety checks done

## 2020-01-02 NOTE — Progress Notes (Signed)
LOS # 2      Summary of Discharge Plan:  TBD home with hh vs snf    Identified Possible Discharge Barriers:  12/10covid + pna, copd, 2l o2, iv abx, iv steroid, dialysis pt,     CM Interventions and Outcome:  Cm following     Discussed above Discharge Plan with (patient, family, Care Team, others):  Care team     Case Management will continue to following on patient's discharge needs.      Mitzi Hansen, MHA, [place], CM  Case Manager [facility]

## 2020-01-02 NOTE — UM Notes (Signed)
[facility] Utilization Review   NPI #[id number], Tax ID [id number]  Please call Milas Gain MSN RN CCM @ [phone] with any questions or concerns.  Email:  [email or web address]  Fax final authorization and requests for additional information to [phone]    CONCURRENT REVIEW FOR: 01/01/20    LOC: IMCU    PATIENT NAME: Melby,Novia L / 10/20/1955 / AGE: 64 y.o.          V/S:     01/01/20 0737   BP: 146/76   Pulse: 78   Resp: 21   Temp: 99.7 F (37.6 C)   SpO2: 97%          Temperature: Temp  Min: 98.4 F (36.9 C)  Max: 99.7 F (37.6 C)  Pulse: Pulse  Min: 53  Max: 81  Respiratory: Resp  Min: 19  Max: 21  Non-Invasive BP: BP  Min: 146/76  Max: 182/86  Pulse Oximetry SpO2  Min: 88 %  Max: 98 %        Labs -  Lab 01/01/20  0526   WBC 4.30   RBC 2.56*   Hgb 7.4*   Hematocrit 24.7*       Lab 01/01/20  0051   Chloride 94*   BUN 38.0*   Creatinine 5.9*   Glucose 244*   Calcium 7.9*           MD NOTES:  MEDICINE NOTES  A&P  Patient Active Hospital Problem List:   Pneumonia due to COVID-19 virus (12/31/2019)   History of COPD (06/23/2019)    Assessment: No history of chronic respiratory failure.  Patient is currently on 1 to 2 L of oxygen to maintain sats above 90%.  I noted her to be desat into the mid 80s when the oxygen cannula was displaced from her nares.    Plan: Continue with ceftriaxone 1 g every 24 hour  Continue with dexamethasone 6 mg IV daily  Recommend Robitussin with codeine for cough  Not on Remdesivir on account of end-stage renal disease on hemodialysis; may consider starting a lower dose after discussions with ID  Continue airborne and contact isolation per Covid protocol    Diarrhea   Assessment-patient admits to having several watery bowel movements through the course of the day and this has been going on for the past 7 days  Plan-send C. difficile   Ordered stool WBC  Ordered stool Shigella Salmonella Campylobacter and Cryptosporidium  Started empiric vancomycin 125  mg p.o. every 6 hours  Started Questran 1 pack twice daily     Iron deficiency anemia secondary to inadequate dietary iron intake (05/14/2019)    Assessment: Chronic and stable    Plan: IV iron versus Aranesp per nephrology     ESRD (end stage renal disease) on dialysis (05/24/2019)    Assessment: Dialysis days are Monday Wednesdays and Fridays    Plan: We will request next dialysis session tomorrow-Friday     Type 2 diabetes mellitus, with long-term current use of insulin (05/31/2019)    Assessment: Chronic with unknown control    Plan: Add on hemoglobin A1c  Sliding scale lispro before every meal and at bedtime  Lantus 20 units nightly     GERD (gastroesophageal reflux disease) (05/31/2019)    Assessment: Chronic and appears to be well controlled    Plan: Continue with Protonix 40 mg daily     Hypertension (06/23/2019)    Assessment: Chronic with unclear list of medications at home    Plan: Home  medications to be reconciled  For now continue with midodrine 10 mg.  3 times daily     Hyperlipidemia (06/23/2019)    Assessment: Chronic and unknown control    Plan: Continue with home dose of Lipitor 40 mg daily    History of CVA (cerebrovascular accident) (07/04/2019)    Assessment: Chronic with no acute deficits    Plan: Continue with Lipitor 40 mg daily    Analgesia: Tylenol    Nutrition: Consistent carbohydrate diet    GI Prophylaxis: Protonix    DVT Prophylaxis: Heparin    Code Status: Full code    DISPO: Anticipate discharge in 1 to 2 days      Current Medications:   Scheduled Meds:  Current Facility-Administered Medications   Medication Dose Route Frequency    atorvastatin  40 mg Oral Daily    cefTRIAXone  1 g Intravenous Q24H    cholestyramine  1 packet Oral BID    dexAMETHasone  6 mg Intravenous Daily    dexAMETHasone  10 mg Intravenous Once    fludrocortisone  0.1 mg Oral Daily    heparin (porcine)  5,000 Units Subcutaneous Q12H [place]    insulin glargine  20 Units Subcutaneous QHS    insulin lispro   1-4 Units Subcutaneous QHS    insulin lispro  1-8 Units Subcutaneous TID AC    miconazole 2 % with zinc oxide   Topical Q6H    midodrine  10 mg Oral TID MEALS    pantoprazole  40 mg Oral QAM AC    senna  8.6 mg Oral QHS    sevelamer  800 mg Oral TID MEALS    vancomycin  125 mg Oral 4 times per day    vitamin C  500 mg Oral Daily

## 2020-01-02 NOTE — Progress Notes (Signed)
HOSPITALIST PROGRESS NOTE  [facility]      Patient: [name]  Date: 01/02/2020    LOS: 2 Days  Admission Date: 12/31/2019   MRN: [id number]  Attending: Murlean Hark, MD  , MPH                    Please contact via [place]: [phone]                    Pager: [phone]     ASSESSMENT/PLAN     [name] is a 64 y.o. female admitted with Pneumonia due to COVID-19 virus    Interval Summary:     [name] Problems    Diagnosis    Diarrhea    Pneumonia due to COVID-19 virus    History of CVA (cerebrovascular accident)    History of COPD    Hyperlipidemia    Hypertension    GERD (gastroesophageal reflux disease)    Type 2 diabetes mellitus, with long-term current use of insulin    ESRD (end stage renal disease) on dialysis    Iron deficiency anemia secondary to inadequate dietary iron intake       Patient Active Young Problem List:   Pneumonia due to COVID-19 virus (12/31/2019)   History of COPD (06/23/2019)    Assessment: No history of chronic respiratory failure.  Patient was in the mid 80s without nasal cannula on.  Agrees with keeping the cannula on to avoid recurrent hypoxia.     Plan: Continue with ceftriaxone 1 g every 24 hour  Continue with dexamethasone 6 mg IV daily  Recommend Robitussin with codeine for cough  Not on Remdesivir on account of end-stage renal disease on hemodialysis; may consider starting a lower dose after discussions with ID  Encouraged to use incentive spirometry  Continue airborne and contact isolation per Covid protocol    Diarrhea   Assessment-patient admits to having several watery bowel movements through the course of the day and this has been going on for 7 days prior to arrival; Shigella/Salmonella/Campylobacter tests are negative.  Plan-follow-up C. difficile test result  Continue empiric vancomycin p.o. for now  Continue empiric vancomycin 125 mg p.o. every 6 hours (12/10-)  Continue Questran 1 pack twice  daily     Iron deficiency anemia secondary to inadequate dietary iron intake (05/14/2019)    Assessment: Chronic and stable    Plan: IV iron versus Aranesp per nephrology     ESRD (end stage renal disease) on dialysis (05/24/2019)    Assessment: Dialysis days are Monday Wednesdays and Fridays; last session 12/10    Plan: Appreciate nephrology inputs     Type 2 diabetes mellitus, with long-term current use of insulin (05/31/2019)    Assessment: Chronic with poor control and A1c of 10.2    Plan: Sliding scale lispro before every meal and at bedtime  Continue Lantus 20 units nightly     GERD (gastroesophageal reflux disease) (05/31/2019)    Assessment: Chronic and appears to be well controlled    Plan: Continue with Protonix 40 mg daily     Hypertension (06/23/2019)    Assessment: Chronic with unclear list of medications at home    Plan: Home medications to be reconciled  For now continue with midodrine 10 mg.  3 times daily     Hyperlipidemia (06/23/2019)  Assessment: Chronic and unknown control    Plan: Continue with home dose of Lipitor 40 mg daily     History of CVA (cerebrovascular accident) (07/04/2019)    Assessment: Chronic with no acute deficits    Plan: Continue with Lipitor 40 mg daily    Analgesia: Tylenol    Nutrition: Consistent carbohydrate diet    GI Prophylaxis: Protonix    DVT Prophylaxis: Heparin    Code Status: Full code    DISPO: Discharge currently on hold on account of malaise, poor appetite, dyspnea and ongoing diarrhea       [name] states that she feels poorly.  Her appetite is extremely poor.  She admits to having cough which is now productive.  She admits to having ongoing diarrhea.  RN notes that her stool is soft but not watery today.    Febrile- None   Eating-poorly due to lack of appetite  Coughing- None   Diarrhea- None   Sleeping - Well   Voiding - Well        MEDICATIONS     Current Facility-Administered Medications   Medication Dose Route Frequency    atorvastatin   40 mg Oral Daily    cefTRIAXone  1 g Intravenous Q24H    cholestyramine  1 packet Oral BID    dexAMETHasone  6 mg Intravenous Daily    dexAMETHasone  10 mg Intravenous Once    fludrocortisone  0.1 mg Oral Daily    heparin (porcine)  5,000 Units Subcutaneous Q12H [place]    insulin glargine  20 Units Subcutaneous QHS    insulin lispro  1-4 Units Subcutaneous QHS    insulin lispro  1-8 Units Subcutaneous TID AC    miconazole 2 % with zinc oxide   Topical Q6H    midodrine  10 mg Oral TID MEALS    pantoprazole  40 mg Oral QAM AC    senna  8.6 mg Oral QHS    sevelamer  800 mg Oral TID MEALS    vancomycin  125 mg Oral 4 times per day    vitamin C  500 mg Oral Daily       PHYSICAL EXAM     Vitals:    01/02/20 0758   BP: 158/73   Pulse: 65   Resp: (!) 23   Temp: 98.1 F (36.7 C)   SpO2: 95%       Temperature: Temp  Min: 97.6 F (36.4 C)  Max: 98.8 F (37.1 C)  Pulse: Pulse  Min: 63  Max: 74  Respiratory: Resp  Min: 15  Max: 32  Non-Invasive BP: BP  Min: 121/73  Max: 158/73  Pulse Oximetry SpO2  Min: 95 %  Max: 99 %    Intake and Output Summary (Last 24 hours) at Date Time    Intake/Output Summary (Last 24 hours) at 01/02/2020 0830  Last data filed at 01/01/2020 2327  Gross per 24 hour   Intake 100 ml   Output 725 ml   Net -625 ml       Physical Exam  Constitutional:       General: She is not in acute distress.     Appearance: She is not diaphoretic.   HENT:      Head: Normocephalic and atraumatic.      Right Ear: External ear normal.      Left Ear: External ear normal.   Eyes:      Pupils: Pupils are  equal, round, and reactive to light.   Neck:      Thyroid: No thyromegaly.      Vascular: No JVD.      Trachea: No tracheal deviation.   Cardiovascular:      Rate and Rhythm: Normal rate and regular rhythm.      Heart sounds: Normal heart sounds. No murmur heard.  No friction rub. No gallop.    Pulmonary:      Effort: Pulmonary effort is normal. No respiratory distress.      Breath sounds: No stridor. Rhonchi  present. No wheezing or rales.      Comments: Course rhonchorous breath sounds when patient attempts to cough  Chest:      Chest wall: No tenderness.   Abdominal:      General: Bowel sounds are normal. There is no distension.      Palpations: Abdomen is soft. There is no mass.      Tenderness: There is no abdominal tenderness. There is no guarding or rebound.   Musculoskeletal:         General: No tenderness or deformity. Normal range of motion.      Cervical back: Normal range of motion and neck supple.   Lymphadenopathy:      Cervical: No cervical adenopathy.   Skin:     General: Skin is warm and dry.      Coloration: Skin is not pale.      Findings: No erythema or rash.   Neurological:      Mental Status: She is alert and oriented to person, place, and time.      Cranial Nerves: No cranial nerve deficit.      Motor: No abnormal muscle tone.      Coordination: Coordination normal.      Gait: Gait is intact.      Deep Tendon Reflexes: Reflexes are normal and symmetric.   Psychiatric:         Mood and Affect: Mood and affect normal.         Cognition and Memory: Memory normal.         Judgment: Judgment normal.       LABS     Recent Labs   Lab 01/02/20  0411 01/01/20  0526 12/31/19  2012   WBC 3.24 4.30 5.41   RBC 2.86* 2.56* 3.13*   Hgb 8.2* 7.4* 9.0*   Hematocrit 28.0* 24.7* 29.9*   MCV 97.9* 96.5* 95.5   Platelets 190 148 174       Recent Labs   Lab 01/02/20  0411 01/01/20  0051 12/31/19  2012   Sodium 140 138 136   Potassium 4.1 3.7 3.7   Chloride 99* 94* 92*   CO2 27 25 25    BUN 55.0* 38.0* 33.0*   Creatinine 7.7* 5.9* 5.4*   Glucose 208* 244* 265*   Calcium 7.7* 7.9* 7.9*       Recent Labs   Lab 12/31/19  2012   ALT 10   AST (SGOT) 20   Bilirubin, Total 0.4   Albumin 2.7*   Alkaline Phosphatase 121*       Recent Labs   Lab 01/01/20  0526 01/01/20  0051 12/31/19  2012   Troponin I 0.05 0.05 0.06*             Microbiology Results (last 15 days)     Procedure Component Value Units Date/Time    Clostridium  difficile toxin B PCR [[id number]] Collected: 01/01/20 2336  Order Status: Sent Specimen: Stool Updated: 01/01/20 2338    Narrative:      Patient's current admission began:->less than or equal to 3  days ago  Cancel C-Diff order if no diarrhea in 12 hours?->Yes  DELAY AFFECTS RESULT    Stool Cryptosporidium / Giardia Antigen [[id number]] Collected: 01/01/20 2336    Order Status: Sent Specimen: Stool Updated: 01/02/20 0524    Narrative:      Parasite Screen    Stool for Salm/Shig/Campy/Shiga PCR [[id number]] Collected: 01/01/20 2336    Order Status: Sent Specimen: Stool Updated: 01/02/20 0526    COVID-19 (SARS-CoV-2) and Influenza A/B, NAA (Liat Rapid) [[id number]]  (Abnormal) Collected: 12/31/19 2054    Order Status: Completed Specimen: Culturette from Nasopharyngeal Updated: 12/31/19 2213     Purpose of COVID testing Diagnostic -[facility]     SARS-CoV-2 Specimen Source Nasopharyngeal     SARS CoV 2 Overall Result Detected     Comment: Positive COVID results called to Dr.He.  Readback confirmed by [id number] at 22:13  12/31/2019  Test performed using the Roche cobas Liat SARS-CoV-2 assay. This assay is  only for use under the Food and Drug Administrations Emergency Use  Authorization. This is a real-time RT-PCR assay for the qualitative  detection of SARS-CoV-2 RNA. Viral nucleic acids may persist in vivo,  independent of viability. Detection of viral nucleic acid does not imply the  presence of infectious virus, or that virus nucleic acid is the cause of  clinical symptoms. Negative results do not preclude SARS-CoV-2 infection and  should not be used as the sole basis for diagnosis, treatment or other  patient management decisions. Negative results must be combined with  clinical observations, patient history, and/or epidemiological information.  Invalid results may be due to inhibiting substances in the specimen and  recollection should occur. Please see Fact Sheets for patients and  providers  located:  https://www.[employer].com/.          Influenza A Not Detected     Comment: Influenza A  NOT Detected results should be considered presumptive in  samples that have a positive SARS-CoV-2 (COVID-19) result. If co-infection  with influenza A virus is suspected and influenza virus detection would  change clinical management, please order influenza specific testing.          Influenza B Not Detected     Comment: Influenza B NOT Detected results should be considered presumptive in samples  that have a positive SARS-CoV-2 (COVID-19) result. If co-infection with  influenza B virus is suspected and influenza virus detection would change  clinical management, please order influenza specific testing.  Test performed using the Roche cobas Liat SARS-CoV-2 & Influenza A/B assay.  This assay is only for use under the Food and Drug Administrations  Emergency Use Authorization. This is a multiplex real-time RT-PCR assay  intended for the simultaneous in vitro qualitative detection and  differentiation of SARS-CoV-2, influenza A, and influenza B virus RNA. Viral  nucleic acids may persist in vivo, independent of viability. Detection of  viral nucleic acid does not imply the presence of infectious virus, or that  virus nucleic acid is the cause of clinical symptoms. Negative results do  not preclude SARS-CoV-2, influenza A, and/or influenza B infection and  should not be used as the sole basis for diagnosis, treatment or other  patient management decisions. Negative results must be combined with  clinical observations, patient history, and/or epidemiological information.  Invalid results may be due to inhibiting substances in the specimen and  recollection should occur. Please see Fact Sheets for patients and providers  located: http://[employer].info/.         Narrative:      o Collect and clearly label specimen type:  o PREFERRED-Upper respiratory specimen: One  Nasopharyngeal  Swab in Transport Media.  o Hand deliver to laboratory ASAP  Diagnostic -[facility]    Blood Culture #1 [[id number]] Collected: 12/31/19 2012    Order Status: Completed Specimen: Arm from Blood, Venipuncture Updated: 01/02/20 0321    Narrative:      ORDER#: [id number]                                    ORDERED BY: HE, ALBERT  SOURCE: Blood, Venipuncture Arm                      COLLECTED:  12/31/19 20:12  ANTIBIOTICS AT COLL.:                                RECEIVED :  01/01/20 02:23  Culture Blood Aerobic and Anaerobic        PRELIM      01/02/20 03:21  01/02/20   No Growth after 1 day/s of incubation.      Blood Culture #2 [[id number]] Collected: 12/31/19 2012    Order Status: Completed Specimen: Arm from Blood, Venipuncture Updated: 01/02/20 0321    Narrative:      ORDER#: [id number]                                    ORDERED BY: HE, ALBERT  SOURCE: Blood, Venipuncture Arm                      COLLECTED:  12/31/19 20:12  ANTIBIOTICS AT COLL.:                                RECEIVED :  01/01/20 02:23  Culture Blood Aerobic and Anaerobic        PRELIM      01/02/20 03:21  01/02/20   No Growth after 1 day/s of incubation.             Chest AP Portable    Result Date: 12/31/2019  Bilateral hazy and consolidation throughout the peripheries of the midlungs and lung bases which represent atypical/viral pneumonia, or asymmetric pulmonary edema. Raphael Gibney, DO  12/31/2019 8:02 PM      Echo Results     None          I have personally reviewed the patient's labs, medications, and imaging    Total visit time = 45 mins ; more than 50% spent counseling/coordinating care    Signed,    Murlean Hark MD, MPH  8:30 AM 01/02/2020   Please contact via [place]: [phone]  Pager: [phone]       This chart was generated using a medical voice-recognition software which does not employ spell-checking or grammar-checking features. It was dictated, all or in part, in a busy and often noisy patient care environment. I have  taken all usual measures to dictate carefully and to review all aspects of this chart. Nonetheless, given the known and  well-documented performance characteristics of voice recognition software in such patient care environments, this dictation still may contain unrecognized and wholly unintended errors or omissions.

## 2020-01-02 NOTE — Consults (Signed)
[facility]  CONSULT  Aaron Edelman, x [phone] ([place])      Date Time: 01/02/20 9:19 AM  Patient Name: [name]  Requesting Physician: Murlean Hark, MD  Consulting Physician: Verl Blalock, MD, MD    Primary Care Physician: Merryl Hacker, None, MD    Reason for Consultation: ESRD, provision of hemodialysis      Assessment:     Patient Active Problem List   Diagnosis    Iron deficiency anemia secondary to inadequate dietary iron intake    Sideropenic dysphagia    Peritoneal dialysis catheter dysfunction, initial encounter    Peritonitis    Upper GI bleed    ESRD (end stage renal disease) on dialysis    Generalized abdominal pain    Thrombocytopenia    Hypotension    Type 2 diabetes mellitus, with long-term current use of insulin    GERD (gastroesophageal reflux disease)    Bowel incontinence    Right sided numbness    Uncontrolled type 2 diabetes mellitus with hyperglycemia    Hypertension    Hyperlipidemia    History of COPD    Gastritis with hemorrhage    History of anemia due to chronic kidney disease    Open wound, abdominal wall, lateral, subsequent encounter    Gout    Dizziness    Fall    History of CVA (cerebrovascular accident)    Shortness of breath    Pneumonia due to COVID-19 virus    Diarrhea     1. ESRD on HD MWF at [facility], followed by Dr. Randel Pigg  2. Mild hypocalcemia  3. Anemia of chronic disease  4. Hypoxemia, shortness of breath due to Covid 19 PNA  - not vaccinated  5. Watery diarrhea   6. HTN, labile    Recommendations:     1. Plan for routine HD today, then continue MWF  2. Will add on phos and PTH  3. Iron panel  4. Work up for diarrhea per primary team         Murlean Hark, MD, thank you for this consultation.  We will follow the patient with you during this hospitalization.  Please contact me with any questions or issues.    Signed by: Verl Blalock, MD, MD  [facility]  [phone] (office)  x [phone] ([place])      -----------------------------------------------------------------------------------------------------------        History of Presenting Illness:   [name] is a 64 y.o. female with ESRD on HD MWF at [facility], DM type 2, HTN, HLD who presented to the hospital with 5 day history of generalized aches, worsening shortening shortness of breath and hypoxemia, found to have Covid 19 PNA. Patient is not vaccinated. Imaging notable for bilateral hazy and consolidation throughout the peripheries of the midlungs and lung bases. Started on steroids, ceftriaxone, and oral vancomycin.     Past Medical History:     Past Medical History:   Diagnosis Date    Chronic obstructive pulmonary disease     Diabetes mellitus     Hyperlipidemia     Hypertension     Sleep apnea 2018         Past Surgical History:     Past Surgical History:   Procedure Laterality Date    EGD, BIOPSY N/A 05/14/2019    Procedure: EGD, BIOPSY;  Surgeon: Ronie Spies, MD;  Location: [facility];  Service: Gastroenterology;  Laterality: N/A;    EXPLORATORY  LAPAROTOMY N/A 05/27/2019    Procedure: EXPLORATORY LAPAROTOMY;  Surgeon: Herbert Moors., MD;  Location: [facility];  Service: General;  Laterality: N/A;    JOINT REPLACEMENT      shoulder    JOINT REPLACEMENT      knee    OVARY SURGERY Left     Removal    REMOVAL, FOREIGN BODY N/A 05/27/2019    Procedure: REMOVAL, PERITONEAL DIALYSIS CATHETER;  Surgeon: Herbert Moors., MD;  Location: [facility];  Service: General;  Laterality: N/A;    TUNNELED CATH CHECK/CHANGE (PERMCATH) N/A 07/03/2019    Procedure: TUNNELED CATH CHECK/CHANGE;  Surgeon: Maureen Ralphs, MD;  Location: [facility];  Service: Interventional Radiology;  Laterality: N/A;    TUNNELED CATH PLACEMENT (PERMCATH) Bilateral 05/23/2019    Procedure: TUNNELED CATH PLACEMENT;  Surgeon: Jacques Earthly, MD;  Location: [facility];  Service: Interventional Radiology;  Laterality:  Bilateral;    TUNNELED CATH PLACEMENT (PERMCATH) Right 07/31/2019    Procedure: TUNNELED CATH PLACEMENT;  Surgeon: Jacques Earthly, MD;  Location: [facility];  Service: Interventional Radiology;  Laterality: Right;       Family History:     Family History   Problem Relation Age of Onset    Diabetes Mother     Hypertension Mother     Diabetes Father     Hypertension Father     Diabetes Sister     Hypertension Sister     Diabetes Brother     Hypertension Brother     Diabetes Paternal Aunt     Diabetes Paternal Uncle        Social History:     Social History     Socioeconomic History    Marital status: Legally Separated     Spouse name: Not on file    Number of children: Not on file    Years of education: Not on file    Highest education level: Not on file   Occupational History    Not on file   Tobacco Use    Smoking status: Never Smoker    Smokeless tobacco: Never Used   Brewing technologist Use: Never used   Substance and Sexual Activity    Alcohol use: Never    Drug use: Never    Sexual activity: Not Currently   Other Topics Concern    Not on file   Social History Narrative    Not on file     Social Determinants of Health     Financial Resource Strain:     Difficulty of Paying Living Expenses: Not on file   Food Insecurity:     Worried About Charity fundraiser in the Last Year: Not on file    [employer] of Food in the Last Year: Not on file   Transportation Needs:     Lack of Transportation (Medical): Not on file    Lack of Transportation (Non-Medical): Not on file   Physical Activity:     Days of Exercise per Week: Not on file    Minutes of Exercise per Session: Not on file   Stress:     Feeling of Stress : Not on file   Social Connections:     Frequency of Communication with Friends and Family: Not on file    Frequency of Social Gatherings with Friends and Family: Not on file    Attends Religious Services: Not on file    Active Member of  Clubs or Organizations: Not on file     Attends Archivist Meetings: Not on file    Marital Status: Not on file   Intimate Partner Violence:     Fear of Current or Ex-Partner: Not on file    Emotionally Abused: Not on file    Physically Abused: Not on file    Sexually Abused: Not on file   Housing Stability:     Unable to Pay for Housing in the Last Year: Not on file    Number of [place] in the Last Year: Not on file    Unstable Housing in the Last Year: Not on file       Allergies:     Allergies   Allergen Reactions    Lisinopril Hives    Metformin Hives       Medications:     Medications Prior to Admission   Medication Sig    acetaminophen (TYLENOL) 325 MG tablet Take 2 tablets (650 mg total) by mouth every 6 (six) hours as needed for Pain or Fever    atorvastatin (LIPITOR) 40 MG tablet Take 40 mg by mouth daily    bisacodyl (DULCOLAX) 5 MG EC tablet Take 1 tablet (5 mg total) by mouth daily as needed for Constipation    colchicine 0.6 MG tablet Take 1 tablet (0.6 mg total) by mouth every 14 (fourteen) days Every 2 weeks next dose will be on 08/09/2019    collagenase (SANTYL) ointment Apply topically every 24 hours    darbepoetin alfa (ARANESP) 60 MCG/0.3ML injection Inject 0.3 mLs (60 mcg total) into the skin once a week    Dulaglutide (Trulicity) 9.16 OM/6.0OK Solution Pen-injector Inject 0.75 mg into the skin once a week    famotidine (PEPCID) 40 MG tablet Take 40 mg by mouth daily    fludrocortisone (FLORINEF) 0.1 MG tablet Take 1 tablet (0.1 mg total) by mouth daily    insulin aspart (NovoLOG) 100 UNIT/ML injection Inject 5 Units into the skin 3 (three) times daily before meals (Patient taking differently: Inject 15 Units into the skin 3 (three) times daily before meals   )    lidocaine (LIDODERM) 5 % Place 1 patch onto the skin every 24 hours Remove & Discard patch within 12 hours or as directed by MD    melatonin 3 mg tablet Take 1 tablet (3 mg total) by mouth nightly as needed for Sleep    midodrine  (PROAMATINE) 10 MG tablet Take 1 tablet (10 mg total) by mouth 3 (three) times daily with meals    nystatin (NYSTOP) powder Apply 1 application topically 2 (two) times daily    ondansetron (ZOFRAN-ODT) 4 MG disintegrating tablet Take 1 tablet (4 mg total) by mouth every 6 (six) hours as needed for Nausea    pantoprazole (PROTONIX) 40 MG tablet Take 1 tablet (40 mg total) by mouth 2 (two) times daily    senna (SENOKOT) 8.6 MG Tab Take 1 tablet (8.6 mg total) by mouth nightly    sevelamer (RENVELA) 800 MG tablet Take 800 mg by mouth 3 (three) times daily with meals    sodium hypochlorite (DAKIN'S QUARTER STRENGTH) 0.125 % Solution Irrigate with as directed daily Dressing changes twice daily    vitamin C (ASCORBIC ACID) 500 MG tablet Take 1 tablet (500 mg total) by mouth daily    zinc sulfate (ZINCATE) 220 (50 Zn) MG capsule Take 1 capsule (220 mg total) by mouth daily  Scheduled Meds: PRN Meds:    atorvastatin, 40 mg, Oral, Daily  cefTRIAXone, 1 g, Intravenous, Q24H  cholestyramine, 1 packet, Oral, BID  dexAMETHasone, 6 mg, Intravenous, Daily  dexAMETHasone, 10 mg, Intravenous, Once  fludrocortisone, 0.1 mg, Oral, Daily  heparin (porcine), 5,000 Units, Subcutaneous, Q12H SCH  insulin glargine, 20 Units, Subcutaneous, QHS  insulin lispro, 1-4 Units, Subcutaneous, QHS  insulin lispro, 1-8 Units, Subcutaneous, TID AC  miconazole 2 % with zinc oxide, , Topical, Q6H  midodrine, 10 mg, Oral, TID MEALS  pantoprazole, 40 mg, Oral, QAM AC  senna, 8.6 mg, Oral, QHS  sevelamer, 800 mg, Oral, TID MEALS  vancomycin, 125 mg, Oral, 4 times per day  vitamin C, 500 mg, Oral, Daily          Continuous Infusions:   acetaminophen, 650 mg, Q6H PRN   Or  acetaminophen, 650 mg, Q6H PRN  acetaminophen, 650 mg, Once PRN  acetaminophen, 650 mg, Q6H PRN  albumin human, 100 mL, PRN  bisacodyl, 5 mg, Daily PRN  dextrose, 15 g of glucose, PRN   And  dextrose, 12.5 g, PRN   And  glucagon (rDNA), 1 mg, PRN  guaiFENesin-codeine, 5 mL,  Q4H PRN  hydrALAZINE, 10 mg, Q6H PRN  melatonin, 3 mg, QHS PRN  naloxone, 0.2 mg, PRN  ondansetron, 4 mg, Once PRN  ondansetron, 4 mg, Q6H PRN  sodium chloride, 100 mL, Q1H PRN  sodium chloride, 250 mL, PRN            Review of Systems:   Deferred today      Physical Exam:     Vitals:    01/01/20 1946 01/01/20 2327 01/02/20 0355 01/02/20 0758   BP: 148/85 153/78 130/72 158/73   Pulse: 67 66 63 65   Resp: (!) 23 15 18  (!) 23   Temp: 98.2 F (36.8 C) 97.6 F (36.4 C) 97.6 F (36.4 C) 98.1 F (36.7 C)   TempSrc: Oral Oral Oral Axillary   SpO2: 99% 97% 97% 95%   Weight:   47.6 kg (104 lb 15 oz)    Height:           Intake and Output Summary (Last 24 hours) at Date Time    Intake/Output Summary (Last 24 hours) at 01/02/2020 0919  Last data filed at 01/01/2020 2327  Gross per 24 hour   Intake    Output 225 ml   Net -225 ml       Recent weights:  Weight Monitoring 07/21/2019 07/22/2019 07/23/2019 07/26/2019 12/31/2019 12/31/2019 01/02/2020   Height - - - - 157.5 cm 157.5 cm -   Height Method - - - - - - -   Weight 104.8 kg 107.2 kg 108.8 kg 108.8 kg 98.294 kg 92.6 kg 47.6 kg   Weight Method - Bed Scale Bed Scale - Bed Scale - Bed Scale   BMI (calculated) - - - - 39.7 kg/m2 37.4 kg/m2 -       Physical exam not performed given isolation for covid 19  Please, refer to primary teams' assessment      Labs:     Recent Labs   Lab 01/02/20  0411 01/01/20  0526 12/31/19  2012   WBC 3.24 4.30 5.41   Hgb 8.2* 7.4* 9.0*   Hematocrit 28.0* 24.7* 29.9*   Platelets 190 148 174     Recent Labs   Lab 01/02/20  0411 01/01/20  0051 12/31/19  2012   Sodium 140 138 136  Potassium 4.1 3.7 3.7   Chloride 99* 94* 92*   CO2 27 25 25    BUN 55.0* 38.0* 33.0*   Creatinine 7.7* 5.9* 5.4*   Calcium 7.7* 7.9* 7.9*   Albumin  --   --  2.7*   Glucose 208* 244* 265*   EGFR 6.4 8.7 9.6           cc: Murlean Hark, MD  Pcp, None, MD

## 2020-01-02 NOTE — Plan of Care (Addendum)
Problem: Ineffective Airway Clearance  Goal: Airway is maintained  Outcome: Progressing  Flowsheets (Taken 01/02/2020 0758)  Airway is maintained: Maintain oxygen level of greater than 92% or as ordered by LIP (reworded)     Problem: Inadequate Gas Exchange  Goal: Adequate oxygenation and improved ventilation  01/02/2020 1039 by Ofilia Neas, RN  Outcome: Progressing  Flowsheets (Taken 01/02/2020 0758)  Adequate oxygenation and improved ventilation:   Assess lung sounds   Monitor SpO2 and treat as needed   Provide mechanical and oxygen support to facilitate gas exchange   Teach/reinforce use of incentive spirometer 10 times per hour while awake, cough and deep breath as needed   Plan activities to conserve energy: plan rest periods  01/02/2020 0758 by Ofilia Neas, RN  Outcome: Progressing  Flowsheets (Taken 01/02/2020 0758)  Adequate oxygenation and improved ventilation:   Assess lung sounds   Monitor SpO2 and treat as needed   Provide mechanical and oxygen support to facilitate gas exchange   Teach/reinforce use of incentive spirometer 10 times per hour while awake, cough and deep breath as needed   Plan activities to conserve energy: plan rest periods     Problem: Renal Instability  Goal: Fluid and electrolyte balance are achieved/maintained  Outcome: Progressing  Flowsheets (Taken 01/02/2020 1039)  Fluid and electrolyte balance are achieved/maintained:   Monitor intake and output every shift   Provide adequate hydration   Pt a/o and tolerated am meds, assessment, and tolerated 50% breakfast.  Pt being dialyzed for 3 hours in room.  Currently on 2L and will wean after procedure. HR/BP/RR WNL, afebrile.  1 smear BM noted during shift.  Pt tolerated room air for 4 hours before 2L nc needed to keep SpO2 above 92%.  Pt fatigued after dialysis.  Pt moved from 2818 to 2815 for dialysis.  Pt tolerated 50% dinner

## 2020-01-02 NOTE — Plan of Care (Signed)
Had 3 loose BM during shift, stool specimen collected as ordered, skin kept clean and dry, moisture barrier applied. Frequent cough noted, PRN med given. Labs and VS monitored, O2sat maintained above 94% on [facility], RN will continue to monitor.    Problem: Pain  Goal: Pain at adequate level as identified by patient  Outcome: Progressing  Flowsheets (Taken 01/02/2020 0508)  Pain at adequate level as identified by patient:   Identify patient comfort function goal   Assess for risk of opioid induced respiratory depression, including snoring/sleep apnea. Alert healthcare team of risk factors identified.   Assess pain on admission, during daily assessment and/or before any "as needed" intervention(s)   Reassess pain within 30-60 minutes of any procedure/intervention, per Pain Assessment, Intervention, Reassessment (AIR) Cycle   Evaluate if patient comfort function goal is met   Evaluate patient's satisfaction with pain management progress   Offer non-pharmacological pain management interventions     Problem: Compromised Tissue integrity  Goal: Damaged tissue is healing and protected  Flowsheets (Taken 01/01/2020 0459)  Damaged tissue is healing and protected:   Monitor/assess Braden scale every shift   Provide wound care per wound care algorithm   Relieve pressure to bony prominences for patients at moderate and high risk   Avoid shearing injuries   Keep intact skin clean and dry   Monitor external devices/tubes for correct placement to prevent pressure, friction and shearing   Encourage use of lotion/moisturizer on skin   Consult/collaborate with wound care nurse   Use incontinence wipes for cleaning urine, stool and caustic drainage. Foley care as needed   Use bath wipes, not soap and water, for daily bathing     Problem: Inadequate Gas Exchange  Goal: Adequate oxygenation and improved ventilation  Flowsheets (Taken 01/02/2020 0508)  Adequate oxygenation and improved ventilation:   Assess lung sounds   Monitor SpO2 and  treat as needed   Provide mechanical and oxygen support to facilitate gas exchange   Position for maximum ventilatory efficiency

## 2020-01-02 NOTE — Plan of Care (Signed)
Problem: Ineffective Airway Clearance  Goal: Airway is maintained  Outcome: Progressing  Flowsheets (Taken 01/02/2020 0758)  Airway is maintained: Maintain oxygen level of greater than 92% or as ordered by LIP (reworded)     Problem: Inadequate Gas Exchange  Goal: Adequate oxygenation and improved ventilation  Outcome: Progressing  Flowsheets (Taken 01/02/2020 0758)  Adequate oxygenation and improved ventilation:   Assess lung sounds   Monitor SpO2 and treat as needed   Provide mechanical and oxygen support to facilitate gas exchange   Teach/reinforce use of incentive spirometer 10 times per hour while awake, cough and deep breath as needed   Plan activities to conserve energy: plan rest periods   Pt a.o, HR NS 63, SpO2 94%.

## 2020-01-02 NOTE — Progress Notes (Signed)
Pt is covid +,  PPE done per protocol.  Pt is alert and oriented. Pt denied pain and sob. No other complain given. LAVF access is optimally working post dialysis.  No bleeding on access site post tx.  Hemodialysis ended and pt. able to finish 3hrs and tolerated tx.  Able to removed Net Fluid of 3L.  Reports were given to Primary nurse.       01/02/20 1320   Treatment Summary   Time Off Machine 1300   Duration of Treatment (Hours) 3   Treatment Type 1:1   Dialyzer Clearance Moderately streaked   Fluid Volume Off (mL) 3450   Prime Volume (mL) 200   Rinseback Volume (mL) 250   Fluid Given: Normal Saline (mL) 0   Fluid Given: PRBC  0 mL   Fluid Given: Albumin (mL) 0   Fluid Given: Other (mL) 0   Total Fluid Given 450   Hemodialysis Net Fluid Removed 3000   Post Treatment Assessment   Post-Treatment Weight (Kg)   (-3L)   Patient Response to Treatment tolerated   Additional Dialyzer Used 0   Graft/Fistula Left   No placement date or time found.   Access Type: Arteriovenous Fistula  Orientation: Left  Graft/Fistula Location: Upper Arm   Fistula/ Graft Assessment Abnormalities WDL   Needle Size 16 Gauge   Cannulation Sites held Arterial (min) 8   Cannulation Sites held Venous (min) 8   Hemostasis Achieved Yes   Vitals   Temp 97.6 F (36.4 C)   Heart Rate 62   Resp Rate 19   BP 149/79   SpO2 100 %   O2 Device Nasal cannula   Assessment   Mental Status Alert;Oriented   Respiratory Pattern Regular   Bilateral Breath Sounds Diminished   Edema  WDL   General Skin Color Appropriate for ethnicity   Skin Condition/Temp Warm;Dry   Abdomen Inspection Nondistended   Mobility Bed   Pain Assessment   Charting Type Assessment   Pain Scale Used Numeric Scale (0-10)   Numeric Pain Scale   Pain Score 0   POSS Score 1   Education   Person taught Patient   Knowledge basis Substantial   Topics taught Procedure   Teaching Tools Explain   Reponse Verbalizes Understanding   Bedside Nurse Communication   Name of bedside RN - post dialysis Ofilia Neas, RN

## 2020-01-02 NOTE — Plan of Care (Signed)
Problem: Renal Instability  Goal: Fluid and electrolyte balance are achieved/maintained  Outcome: Progressing  Flowsheets (Taken 01/02/2020 1026)  Fluid and electrolyte balance are achieved/maintained:   Monitor/assess lab values and report abnormal values   Monitor daily weight   Observe for cardiac arrhythmias  Goal: Free from infection  Outcome: Progressing  Flowsheets (Taken 01/02/2020 1026)  Free from infection: Monitor/assess for signs and symptoms of infection     Problem: Patient Receiving Advanced Renal Therapies  Goal: Therapy access site remains intact  Outcome: Progressing  Flowsheets (Taken 01/02/2020 1026)  Therapy access site remains intact: Assess therapy access site

## 2020-01-03 LAB — CBC
Absolute NRBC: 0 10*3/uL (ref 0.00–0.00)
Hematocrit: 31 % — ABNORMAL LOW (ref 34.7–43.7)
Hgb: 9 g/dL — ABNORMAL LOW (ref 11.4–14.8)
MCH: 28.5 pg (ref 25.1–33.5)
MCHC: 29 g/dL — ABNORMAL LOW (ref 31.5–35.8)
MCV: 98.1 fL — ABNORMAL HIGH (ref 78.0–96.0)
MPV: 11.2 fL (ref 8.9–12.5)
Nucleated RBC: 0 /100 WBC (ref 0.0–0.0)
Platelets: 238 10*3/uL (ref 142–346)
RBC: 3.16 10*6/uL — ABNORMAL LOW (ref 3.90–5.10)
RDW: 15 % (ref 11–15)
WBC: 4.11 10*3/uL (ref 3.10–9.50)

## 2020-01-03 LAB — BASIC METABOLIC PANEL
Anion Gap: 16 — ABNORMAL HIGH (ref 5.0–15.0)
BUN: 36 mg/dL — ABNORMAL HIGH (ref 7.0–19.0)
CO2: 23 mEq/L (ref 22–29)
Calcium: 8.6 mg/dL (ref 8.5–10.5)
Chloride: 99 mEq/L — ABNORMAL LOW (ref 100–111)
Creatinine: 5.8 mg/dL — ABNORMAL HIGH (ref 0.6–1.0)
Glucose: 140 mg/dL — ABNORMAL HIGH (ref 70–100)
Potassium: 4.4 mEq/L (ref 3.5–5.1)
Sodium: 138 mEq/L (ref 136–145)

## 2020-01-03 LAB — GLUCOSE WHOLE BLOOD - POCT
Whole Blood Glucose POCT: 119 mg/dL — ABNORMAL HIGH (ref 70–100)
Whole Blood Glucose POCT: 229 mg/dL — ABNORMAL HIGH (ref 70–100)
Whole Blood Glucose POCT: 330 mg/dL — ABNORMAL HIGH (ref 70–100)
Whole Blood Glucose POCT: 301 mg/dL — ABNORMAL HIGH (ref 70–100)

## 2020-01-03 LAB — HEMOLYSIS INDEX: Hemolysis Index: 2 (ref 0–18)

## 2020-01-03 LAB — CLOSTRIDIUM DIFFICILE TOXIN B PCR: Stool Clostridium difficile Toxin B PCR: POSITIVE — AB

## 2020-01-03 LAB — GFR: EGFR: 8.9

## 2020-01-03 MED ORDER — CHOLESTYRAMINE 4 G PO PACK
2.0000 | PACK | Freq: Two times a day (BID) | ORAL | Status: DC
Start: 2020-01-03 — End: 2020-01-08
  Administered 2020-01-03 – 2020-01-08 (×10): 2 via ORAL
  Filled 2020-01-03 (×4): qty 1
  Filled 2020-01-03 (×3): qty 2
  Filled 2020-01-03 (×2): qty 1
  Filled 2020-01-03 (×4): qty 2

## 2020-01-03 NOTE — Progress Notes (Signed)
[facility] PROGRESS NOTE  Aaron Edelman, x [phone] ([place])      Date Time: 01/03/20 11:48 AM  Patient Name: [name]  Attending Physician: Murlean Hark, MD    CC: follow-up esrd    Assessment:   1. ESRD on HD MWF at [facility], followed by Dr. Randel Pigg dialysed yesterday   2. Mild hypocalcemia  3. Anemia of chronic disease  4. Hypoxemia, shortness of breath due to Covid 19 PNA  - not vaccinated - increased ) 2 requirement5. Watery diarrhea + cdiff  6. HTN, labile      Recommendations:   1 continue rx per primary peam   2 next dialysis Monday       Case discussed with: dialysis rn   Parris Cudworth Charolotte Capuchin, MD  [facility]  [phone] (office)  X [phone] ([place])      Subjective: alert / drowsy     Review of Systems:   Review of Systems - + sob and diarrhoea       Physical Exam:     Vitals:    01/03/20 0822 01/03/20 0825 01/03/20 0826 01/03/20 1046   BP:    170/74   Pulse:    60   Resp:    19   Temp:    98.7 F (37.1 C)   TempSrc:    Axillary   SpO2: 94% (!) 88% 95%    Weight:       Height:           Intake and Output Summary (Last 24 hours) at Date Time    Intake/Output Summary (Last 24 hours) at 01/03/2020 1148  Last data filed at 01/03/2020 0841  Gross per 24 hour   Intake 600 ml   Output 3050 ml   Net -2450 ml       General: awake, alert, oriented x 3, no acute distress.  Cardiovascular: regular rate and rhythm, no murmurs, rubs or gallops  Lungs: clear to auscultation bilaterally, without wheezing, rhonchi, or rales  Abdomen: soft, non-tender, non-distended, normoactive bowel sounds  Extremities: no clubbing, cyanosis, or edema      Meds:      Scheduled Meds: PRN Meds:    atorvastatin, 40 mg, Oral, Daily  cholestyramine, 2 packet, Oral, BID  dexAMETHasone, 6 mg, Intravenous, Daily  dexAMETHasone, 10 mg, Intravenous, Once  fludrocortisone, 0.1 mg, Oral, Daily  heparin (porcine), 5,000 Units, Subcutaneous, Q12H SCH  insulin glargine, 20 Units, Subcutaneous,  QHS  insulin lispro, 1-4 Units, Subcutaneous, QHS  insulin lispro, 1-8 Units, Subcutaneous, TID AC  miconazole 2 % with zinc oxide, , Topical, Q6H  midodrine, 10 mg, Oral, TID MEALS  pantoprazole, 40 mg, Oral, QAM AC  senna, 8.6 mg, Oral, QHS  sevelamer, 800 mg, Oral, TID MEALS  vancomycin, 125 mg, Oral, 4 times per day  vitamin C, 500 mg, Oral, Daily          Continuous Infusions:   acetaminophen, 650 mg, Q6H PRN   Or  acetaminophen, 650 mg, Q6H PRN  acetaminophen, 650 mg, Once PRN  acetaminophen, 650 mg, Q6H PRN  bisacodyl, 5 mg, Daily PRN  dextrose, 15 g of glucose, PRN   And  dextrose, 12.5 g, PRN   And  glucagon (rDNA), 1 mg, PRN  guaiFENesin-codeine, 5 mL, Q4H PRN  hydrALAZINE, 10 mg, Q6H PRN  melatonin, 3 mg, QHS PRN  naloxone, 0.2 mg, PRN  ondansetron, 4 mg, Q6H PRN  Labs:     Recent Labs   Lab 01/03/20  0430 01/02/20  0411 01/01/20  0526   WBC 4.11 3.24 4.30   Hgb 9.0* 8.2* 7.4*   Hematocrit 31.0* 28.0* 24.7*   Platelets 238 190 148     Recent Labs   Lab 01/03/20  0430 01/02/20  0411 01/01/20  0051 12/31/19  2012 12/31/19  2012   Sodium 138 140 138  More results in Results Review 136   Potassium 4.4 4.1 3.7  More results in Results Review 3.7   Chloride 99* 99* 94*  More results in Results Review 92*   CO2 23 27 25   More results in Results Review 25   BUN 36.0* 55.0* 38.0*  More results in Results Review 33.0*   Creatinine 5.8* 7.7* 5.9*  More results in Results Review 5.4*   Calcium 8.6 7.7* 7.9*  More results in Results Review 7.9*   Albumin  --   --   --   --  2.7*   Phosphorus  --  5.5*  --   --   --    Glucose 140* 208* 244*  More results in Results Review 265*   EGFR 8.9 6.4 8.7  More results in Results Review 9.6   More results in Results Review = values in this interval not displayed.             Invalid input(s): LEUKOCYTESUR        Imaging personally reviewed, including: No results found.        Signed by: Laural Golden, MD

## 2020-01-03 NOTE — Plan of Care (Addendum)
Problem: Inadequate Gas Exchange  Goal: Adequate oxygenation and improved ventilation  Outcome: Progressing  Flowsheets (Taken 01/03/2020 1747)  Adequate oxygenation and improved ventilation:   Assess lung sounds   Monitor SpO2 and treat as needed   Provide mechanical and oxygen support to facilitate gas exchange   Position for maximum ventilatory efficiency     Problem: Renal Instability  Goal: Fluid and electrolyte balance are achieved/maintained  Outcome: Progressing  Flowsheets (Taken 01/03/2020 1747)  Fluid and electrolyte balance are achieved/maintained:   Monitor intake and output every shift   Provide adequate hydration   Assess and reassess fluid and electrolyte status   Pt a/o, nausea during med administration.  Zofran given, no emesis.  Pt had 2 small BM s during shift.  Antifungal and barrier creams applied.  Titrated nasal cannula from 3L to 1.5 with sPo2 currently 97%.  Pt tolerated 25% breakfast, 25% lunch, 100% dinner.  Blood sugar steadily increased during shift.  Pt otherwise appears comfortable, no complaints of pain, sob.  Pt willing to try bedside commode for BM

## 2020-01-03 NOTE — Plan of Care (Signed)
Pt AO x 4 and follows commands, drowsy at times. NS/ST on tele. O2 titrated down to 1L on NC, saturating > 95%. On consistent carb diet, had 5  BMs overnight, Z guard and anti fungal creams applied after each cleaning. CHG bath performed, Linens, gown and pads changed. PRN Tylenol given for pain around 1 AM.  Pt currently resting without complaints of pain or discomfort. Bed lowest position, call bell within reach, report given to dayshift RN.   Problem: Moderate/High Fall Risk Score >5  Goal: Patient will remain free of falls  Outcome: Progressing  Flowsheets (Taken 01/03/2020 1940)  High (Greater than 13):   HIGH-Consider use of low bed   HIGH-Initiate use of floor mats as appropriate   HIGH-Pharmacy to initiate evaluation and intervention per protocol   HIGH-Apply yellow "Fall Risk" arm band   HIGH-Utilize chair pad alarm for patient while in the chair   HIGH-Bed alarm on at all times while patient in bed   HIGH-Visual cue at entrance to patient's room     Problem: Safety  Goal: Patient will be free from injury during hospitalization  Outcome: Progressing  Flowsheets (Taken 01/03/2020 0216)  Patient will be free from injury during hospitalization:   Assess patient's risk for falls and implement fall prevention plan of care per policy   Provide and maintain safe environment   Use appropriate transfer methods   Ensure appropriate safety devices are available at the bedside   Hourly rounding  Goal: Patient will be free from infection during hospitalization  Outcome: Progressing  Flowsheets (Taken 01/03/2020 0216)  Free from Infection during hospitalization:   Assess and monitor for signs and symptoms of infection   Monitor lab/diagnostic results     Problem: Compromised Tissue integrity  Goal: Damaged tissue is healing and protected  Outcome: Progressing  Flowsheets (Taken 01/03/2020 0216)  Damaged tissue is healing and protected:   Monitor/assess Braden scale every shift   Provide wound care per  wound care algorithm   Reposition patient every 2 hours and as needed unless able to reposition self   Increase activity as tolerated/progressive mobility   Relieve pressure to bony prominences for patients at moderate and high risk   Avoid shearing injuries   Keep intact skin clean and dry   Use bath wipes, not soap and water, for daily bathing   Monitor external devices/tubes for correct placement to prevent pressure, friction and shearing   Monitor patient's hygiene practices   Consult/collaborate with wound care nurse   Utilize specialty bed   Encourage use of lotion/moisturizer on skin  Goal: Nutritional status is improving  Outcome: Progressing  Flowsheets (Taken 01/03/2020 0216)  Nutritional status is improving:   Assist patient with eating   Allow adequate time for meals   Collaborate with Clinical Nutritionist   Include patient/patient care companion in decisions related to nutrition     Problem: Compromised Hemodynamic Status  Goal: Vital signs and fluid balance maintained/improved  Outcome: Progressing  Flowsheets (Taken 01/03/2020 0216)  Vital signs and fluid balance are maintained/improved:   Position patient for maximum circulation/cardiac output   Monitor/assess vitals and hemodynamic parameters with position changes   Monitor/assess lab values and report abnormal values   Monitor and compare daily weight   Monitor intake and output. Notify LIP if urine output is less than 30 mL/hour.     Problem: Ineffective Airway Clearance  Goal: Airway is maintained  Outcome: Progressing  Flowsheets (Taken 01/03/2020 0216)  Airway is maintained:  Teach/Reinforce use of incentive spirometer 10 times per hour while awake, cough and deep breath as needed   Maintain oxygen level of greater than 92% or as ordered by LIP (reworded)   Reposition patient every 2 hours and as needed unless able to self-reposition     Problem: Inadequate Gas Exchange  Goal: Adequate oxygenation and improved  ventilation  Outcome: Progressing  Flowsheets (Taken 01/03/2020 2202)  Adequate oxygenation and improved ventilation:   Assess lung sounds   Consult/collaborate with Respiratory Therapy   Teach/reinforce use of incentive spirometer 10 times per hour while awake, cough and deep breath as needed  Goal: Patent Airway maintained  Outcome: Progressing  Flowsheets (Taken 01/03/2020 0216)  Patent airway maintained:   Position patient for maximum ventilatory efficiency   Reposition patient every 2 hours and as needed unless able to self-reposition     Problem: Renal Instability  Goal: Fluid and electrolyte balance are achieved/maintained  Outcome: Progressing  Flowsheets (Taken 01/03/2020 2202)  Fluid and electrolyte balance are achieved/maintained:   Monitor intake and output every shift   Monitor/assess lab values and report abnormal values   Provide adequate hydration   Monitor daily weight   Monitor for muscle weakness   Follow fluid restrictions/IV/PO parameters   Assess for confusion/personality changes   Assess and reassess fluid and electrolyte status  Goal: Free from infection  Outcome: Progressing  Flowsheets (Taken 01/03/2020 0216)  Free from infection: Monitor/assess for signs and symptoms of infection

## 2020-01-03 NOTE — UM Notes (Signed)
Reconsideration 01/03/20      PA recommendation:  Would recommend sending reconsideration clinicals with documentation of recurrent hypoxia and need for continued stay in the setting of ongoing diarrhea, concerns for volume issues, etc.    If denial upheld will require p2p with the attending MD.    Barkley Boards, MD    ED Diagnoses    Final diagnoses   Pneumonia due to COVID-19 virus        SOB (shortness of breath)        ESRD (end stage renal disease) on dialysis        Elevated troponin I level        64 y.o. female h/o copd, dm, hld, htn, esrd, missed hd and was getting it today when she became fatigue and sob and was unable to tolerate the HD. Pt was sent here by ems. During my exam, pt appeared somnolent, was unable to provide history. There is also a wet cough. Patient was seen immediately because of high probability of imminent or life threatening deterioration in the patient's condition.     Per EMS, pt was in respiratory distress when they arrived. Pt was given duo-neb which did help "open her lungs." pt has not been vaccinated against covid and had been feeling unwell for 1 wk and so has missed HD prior to today's visit.        99 F (37.2 C) Oral 53(Abnormal)  88 %(Abnormal)  -- 20 174/75   Pulse ox 94% on 4 L/ nc    SARS CoV 2 Overall Result Detected (*)     Troponin I [[id number]] (Abnormal) Collected: 12/31/19 2012   Specimen: Blood Updated: 12/31/19 2049    Troponin I 0.06High ng/mL        Glucose 265 (*)      BUN 33.0 (*)     Creatinine 5.4 (*)     Chloride 92 (*)     Calcium 7.9 (*)     Albumin 2.7 (*)     Alkaline Phosphatase 121 (*)     Globulin 4.6 (*)     Albumin/Globulin Ratio 0.6 (*)     Anion Gap 19.0 (*)       Hgb 9.0 (*)     Hematocrit 29.9 (*)     RBC 3.13 (*)     MCHC 30.1 (*)     Monocytes Absolute Automated 0.17 (*)       Troponin I 0.06 (*)      pH, Arterial 7.481 (*)     pO2, Arterial 125.0 (*)     Arterial Total CO2 58.5 (*)      Base Excess, Arterial 4.6 (*)      FIO2 40 %      Status Oxygen    O2 Delivery Nasal Cannula    O2 Flow 5.0 L/min     Rate 18 BPM        Chest AP Portable   Final Result      Bilateral hazy and consolidation throughout the peripheries of the   midlungs and lung bases which represent atypical/viral pneumonia, or   asymmetric pulmonary edema.      Raphael Gibney, DO    12/31/2019 8:02 PM     ECG 12 lead (Final result)  Component (Lab Inquiry)  Collection Time Result Time Ventr Rate Atrial Rat P-R Int. QRS Durat Q-T Int. QTC CALC P Axis RAxis T Axis   12/31/19 20:01:49 01/01/20 21:54:05 77 77 130 90 426  482 50 50 7        Final result                Narrative:    SINUS RHYTHM   WITH   PREMATURE SUPRAVENTRICULAR COMPLEXES   OTHERWISE NORMAL ECG   WHEN COMPARED WITH ECG OF   29-Jul-2019 13:27,   PREMATURE SUPRAVENTRICULAR COMPLEXES   ARE NOW   PRESENT   Confirmed by Fredia Beets, MD, [place] ([id number]) on 01/01/2020 9:54:03 PM                  ER MEDS:   12/31/2019 2255 piperacillin-tazobactam (ZOSYN) injection 4.5 g 4.5 g Intravenous Given             Specimen: Blood Updated: 01/01/20 0240    Ferritin 3,158.40High ng/mL    Procalcitonin [[id number]] (Abnormal) Collected: 12/31/19 2012    Updated: 01/01/20 2637    Procalcitonin 0.77High   C Reactive Protein [[id number]] (Abnormal) Collected: 12/31/19 2012   Specimen: Blood Updated: 01/01/20 0157    C-Reactive Protein 17.9High mg/dL      Basic Metabolic Panel [[id number]] (Abnormal) Collected: 01/01/20 0051   Specimen: Blood Updated: 01/01/20 0121    Glucose 244High mg/dL     BUN 38.0High mg/dL     Creatinine 5.9High mg/dL     Calcium 7.9Low mg/dL     Sodium 138 mEq/L     Potassium 3.7 mEq/L     Chloride 94Low mEq/L     CO2 25 mEq/L     Anion Gap 19.0High     CBC without differential [[id number]] (Abnormal) Collected: 01/01/20 0526   Specimen: Blood Updated: 01/01/20 0542    WBC 4.30 x10 3/uL     Hgb 7.4Low g/dL     Hematocrit 24.7Low %     Platelets 148  x10 3/uL     RBC 2.56Low x10 6/uL     MCV 96.5High fL     MCH 28.9 pg     MCHC 30.0Low g/dL     RDW 14 %     MPV 11.4 fL     Nucleated RBC 0.5High /100 WBC     Absolute NRBC 0.02High x10 3/uL      Glucose Whole Blood - POCT [[id number]] (Abnormal) Collected: 01/01/20 2031    Updated: 01/01/20 2036    Whole Blood Glucose POCT 334High mg/dL    Glucose Whole Blood - POCT [[id number]] (Abnormal) Collected: 01/01/20 1739    Updated: 01/01/20 1747    Whole Blood Glucose POCT 392High mg/dL    Glucose Whole Blood - POCT [[id number]] (Abnormal) Collected: 01/01/20 1619    Updated: 01/01/20 1624    Whole Blood Glucose POCT 411High mg/dL      CBC without differential [[id number]] (Abnormal) Collected: 01/02/20 0411   Specimen: Blood Updated: 01/02/20 0532    WBC 3.24 x10 3/uL     Hgb 8.2Low g/dL     Hematocrit 28.0Low %     Platelets 190 x10 3/uL     RBC 2.86Low x10 6/uL     MCV 97.9High fL     MCH 28.7 pg     MCHC 29.3Low g/dL      Basic Metabolic Panel [[id number]] (Abnormal) Collected: 01/02/20 0411   Specimen: Blood Updated: 01/02/20 0545    Glucose 208High mg/dL     BUN 55.0High mg/dL     Creatinine 7.7High mg/dL     Calcium 7.7Low mg/dL     Sodium 140 mEq/L     Potassium 4.1 mEq/L  Chloride 99Low mEq/L      Clostridium difficile toxin B PCR [[id number]] (Abnormal) Collected: 01/01/20 2336   Specimen: Stool Updated: 01/03/20 0130    Stool Clostridium difficile Toxin B PCR Posi     CBC without differential [[id number]] (Abnormal) Collected: 01/03/20 0430   Specimen: Blood Updated: 01/03/20 0444    WBC 4.11 x10 3/uL     Hgb 9.0Low g/dL     Hematocrit 31.0Low %     Platelets 238 x10 3/uL     RBC 3.16Low x10 6/uL     MCV 98.1High fL     MCH 28.5 pg     MCHC 29.0Low g/dL      Basic Metabolic Panel [[id number]] (Abnormal) Collected: 01/03/20 0430   Specimen: Blood Updated: 01/03/20 0542    Glucose 140High mg/dL     BUN 36.0High mg/dL     Creatinine 5.8High mg/dL     Calcium 8.6  mg/dL     Sodium 138 mEq/L     Potassium 4.4 mEq/L     Chloride 99Low mEq/L     CO2 23 mEq/L     Anion Gap 16.0High       H&P:    ASSESSMENT & PLAN     [name] is a 64 y.o. female admitted  with Pneumonia due to COVID-19 virus.    #Acute respiratory failure with hypoxia  -Presented for shortness of breath and hypoxia of 88%  -ABG showed pH of 7.4/PCO2 of 37/PO2 of 125/bicarb of 28  -Continue supplemental O2 by NC monitor O2 sat    #Bilateral pneumonia/COVID-19  -Chest x-ray showed bilateral haziness and consolidation  -Tested positive for COVID-19  -Maintain airborne/contact precaution  -CRP, ferritin, procalcitonin  -Decadron 6 mg IV x QD  -Patient given 1 dose of Zosyn in the ED  -ID consult in a.m.    #ESRD-DD  -Continue hemodialysis per schedule on TTS  -Obtain nephrology consult in a.m.    #Elevated troponin  -Mild likely demand ischemia in the setting of ESRD  -Twelve-lead EKG showed NSR and no ischemic ST or T wave changes  -Telemetry monitoring  -Trend troponin  -Continue home meds  -May consider cardiology consult in a.m.    #Hypertensive urgency  -Resume oral antihypertensive medications  -IV hydralazine as needed    #Anemia of CKD  -H/H stable  -Monitor CBC and transfuse PRBC as needed    #DM type II  -SSI  -Monitor fingersticks closely      GI Prophylaxis      Nutrition  Renal    DVT/VTE Prophylaxis  Heparin    Anticipated medical stability for discharge: 2 to 3 days    Service status/Reason for ongoing hospitalization: Acute respiratory failure with hypoxia/COVID-19   Anticipated Discharge Needs: TBD      12/9 MD PROG NOTE:    64 y.o. female admitted with Pneumonia due to COVID-19 virus    Interval Summary:         Active Hospital Problems    Diagnosis    Diarrhea    Pneumonia due to COVID-19 virus    History of CVA (cerebrovascular accident)    History of COPD    Hyperlipidemia    Hypertension    GERD (gastroesophageal reflux disease)    Type 2 diabetes  mellitus, with long-term current use of insulin    ESRD (end stage renal disease) on dialysis    Iron deficiency anemia secondary to inadequate dietary iron intake  Patient Active Hospital Problem List:   Pneumonia due to COVID-19 virus (12/31/2019)   History of COPD (06/23/2019)    Assessment: No history of chronic respiratory failure.  Patient is currently on 1 to 2 L of oxygen to maintain sats above 90%.  I noted her to be desat into the mid 80s when the oxygen cannula was displaced from her nares.    Plan: Continue with ceftriaxone 1 g every 24 hour  Continue with dexamethasone 6 mg IV daily  Recommend Robitussin with codeine for cough  Not on Remdesivir on account of end-stage renal disease on hemodialysis; may consider starting a lower dose after discussions with ID  Continue airborne and contact isolation per Covid protocol    Diarrhea   Assessment-patient admits to having several watery bowel movements through the course of the day and this has been going on for the past 7 days  Plan-send C. difficile   Ordered stool WBC  Ordered stool Shigella Salmonella Campylobacter and Cryptosporidium  Started empiric vancomycin 125 mg p.o. every 6 hours  Started Questran 1 pack twice daily     Iron deficiency anemia secondary to inadequate dietary iron intake (05/14/2019)    Assessment: Chronic and stable    Plan: IV iron versus Aranesp per nephrology     ESRD (end stage renal disease) on dialysis (05/24/2019)    Assessment: Dialysis days are Monday Wednesdays and Fridays    Plan: We will request next dialysis session tomorrow-Friday     Type 2 diabetes mellitus, with long-term current use of insulin (05/31/2019)    Assessment: Chronic with unknown control    Plan: Add on hemoglobin A1c  Sliding scale lispro before every meal and at bedtime  Lantus 20 units nightly     GERD (gastroesophageal reflux disease) (05/31/2019)    Assessment: Chronic and appears to be well controlled    Plan: Continue with Protonix 40 mg  daily     Hypertension (06/23/2019)    Assessment: Chronic with unclear list of medications at home    Plan: Home medications to be reconciled  For now continue with midodrine 10 mg.  3 times daily     Hyperlipidemia (06/23/2019)    Assessment: Chronic and unknown control    Plan: Continue with home dose of Lipitor 40 mg daily    History of CVA (cerebrovascular accident) (07/04/2019)    Assessment: Chronic with no acute deficits    Plan: Continue with Lipitor 40 mg daily    Analgesia: Tylenol    Nutrition: Consistent carbohydrate diet    GI Prophylaxis: Protonix    DVT Prophylaxis: Heparin    Code Status: Full code    DISPO: Anticipate discharge in 1 to 2 days       [name] states that she is feeling somewhat better.  As per the bedside nurse, patient was somewhat somnolent and less interactive this morning but to the course of the day she has shown improvement and appears to be feeling much better this evening.    Patient Active Hospital Problem List:   Pneumonia due to COVID-19 virus (12/31/2019)   History of COPD (06/23/2019)    Assessment: No history of chronic respiratory failure.  Patient is currently on 1 to 2 L of oxygen to maintain sats above 90%.  I noted her to be desat into the mid 80s when the oxygen cannula was displaced from her nares.    Plan: Continue with ceftriaxone 1 g every  24 hour  Continue with dexamethasone 6 mg IV daily  Recommend Robitussin with codeine for cough  Not on Remdesivir on account of end-stage renal disease on hemodialysis; may consider starting a lower dose after discussions with ID  Continue airborne and contact isolation per Covid protocol    Diarrhea   Assessment-patient admits to having several watery bowel movements through the course of the day and this has been going on for the past 7 days  Plan-send C. difficile   Ordered stool WBC  Ordered stool Shigella Salmonella Campylobacter and Cryptosporidium  Started empiric vancomycin 125 mg  p.o. every 6 hours  Started Questran 1 pack twice daily     Iron deficiency anemia secondary to inadequate dietary iron intake (05/14/2019)    Assessment: Chronic and stable    Plan: IV iron versus Aranesp per nephrology     ESRD (end stage renal disease) on dialysis (05/24/2019)    Assessment: Dialysis days are Monday Wednesdays and Fridays    Plan: We will request next dialysis session tomorrow-Friday     Type 2 diabetes mellitus, with long-term current use of insulin (05/31/2019)    Assessment: Chronic with unknown control    Plan: Add on hemoglobin A1c  Sliding scale lispro before every meal and at bedtime  Lantus 20 units nightly     GERD (gastroesophageal reflux disease) (05/31/2019)    Assessment: Chronic and appears to be well controlled    Plan: Continue with Protonix 40 mg daily     Hypertension (06/23/2019)    Assessment: Chronic with unclear list of medications at home    Plan: Home medications to be reconciled  For now continue with midodrine 10 mg.  3 times daily     Hyperlipidemia (06/23/2019)    Assessment: Chronic and unknown control    Plan: Continue with home dose of Lipitor 40 mg daily    History of CVA (cerebrovascular accident) (07/04/2019)    Assessment: Chronic with no acute deficits    Plan: Continue with Lipitor 40 mg daily    Analgesia: Tylenol    Nutrition: Consistent carbohydrate diet    GI Prophylaxis: Protonix    DVT Prophylaxis: Heparin    Code Status: Full code    DISPO: Anticipate discharge in 1 to 2 days       [name] states that she is feeling somewhat better.  As per the bedside nurse, patient was somewhat somnolent and less interactive this morning but through  the course of the day she has shown improvement and appears to be feeling much better this evening.    Eating-poorly due to lack of appetite    Current Facility-Administered Medications   Medication Dose Route Frequency    atorvastatin  40 mg Oral Daily    cefTRIAXone  1 g Intravenous Q24H     dexAMETHasone  6 mg Intravenous Daily    dexAMETHasone  10 mg Intravenous Once    fludrocortisone  0.1 mg Oral Daily    heparin (porcine)  5,000 Units Subcutaneous Q12H [facility]    insulin lispro  1-4 Units Subcutaneous QHS    insulin lispro  1-8 Units Subcutaneous TID AC    midodrine  10 mg Oral TID MEALS    pantoprazole  40 mg Oral QAM AC    senna  8.6 mg Oral QHS    sevelamer  800 mg Oral TID MEALS    vitamin C  500 mg Oral Daily  Rhonchi present.    12/10: [place]    Reason for Consultation: ESRD, provision of hemodialysis      Assessment:      18 F (36.7 C) Oral 74 99 % -- 32(Abnormal)  139/63  3 L/min           Patient Active Problem List   Diagnosis    Iron deficiency anemia secondary to inadequate dietary iron intake    Sideropenic dysphagia    Peritoneal dialysis catheter dysfunction, initial encounter    Peritonitis    Upper GI bleed    ESRD (end stage renal disease) on dialysis    Generalized abdominal pain    Thrombocytopenia    Hypotension    Type 2 diabetes mellitus, with long-term current use of insulin    GERD (gastroesophageal reflux disease)    Bowel incontinence    Right sided numbness    Uncontrolled type 2 diabetes mellitus with hyperglycemia    Hypertension    Hyperlipidemia    History of COPD    Gastritis with hemorrhage    History of anemia due to chronic kidney disease    Open wound, abdominal wall, lateral, subsequent encounter    Gout    Dizziness    Fall    History of CVA (cerebrovascular accident)    Shortness of breath    Pneumonia due to COVID-19 virus    Diarrhea     1. ESRD on HD MWF at [facility], followed by Dr. Randel Pigg  2. Mild hypocalcemia  3. Anemia of chronic disease  4. Hypoxemia, shortness of breath due to Covid 19 PNA  - not vaccinated  5. Watery diarrhea   6. HTN, labile    Recommendations:     1. Plan for routine HD today, then continue MWF  2. Will add on phos and PTH  3. Iron panel  4. Work up for diarrhea per primary  team     Scheduled Meds: PRN Meds:    atorvastatin, 40 mg, Oral, Daily  cefTRIAXone, 1 g, Intravenous, Q24H  cholestyramine, 1 packet, Oral, BID  dexAMETHasone, 6 mg, Intravenous, Daily  dexAMETHasone, 10 mg, Intravenous, Once  fludrocortisone, 0.1 mg, Oral, Daily  heparin (porcine), 5,000 Units, Subcutaneous, Q12H [place]  insulin glargine, 20 Units, Subcutaneous, QHS  insulin lispro, 1-4 Units, Subcutaneous, QHS  insulin lispro, 1-8 Units, Subcutaneous, TID AC  miconazole 2 % with zinc oxide, , Topical, Q6H  midodrine, 10 mg, Oral, TID MEALS  pantoprazole, 40 mg, Oral, QAM AC  senna, 8.6 mg, Oral, QHS  sevelamer, 800 mg, Oral, TID MEALS  vancomycin, 125 mg, Oral, 4 times per day  vitamin C, 500 mg, Oral, Daily          Continuous Infusions:   acetaminophen, 650 mg, Q6H PRN   Or  acetaminophen, 650 mg, Q6H PRN  acetaminophen, 650 mg, Once PRN  acetaminophen, 650 mg, Q6H PRN  albumin human, 100 mL, PRN  bisacodyl, 5 mg, Daily PRN  dextrose, 15 g of glucose, PRN   And  dextrose, 12.5 g, PRN   And  glucagon (rDNA), 1 mg, PRN  guaiFENesin-codeine, 5 mL, Q4H PRN  hydrALAZINE, 10 mg, Q6H PRN  melatonin, 3 mg, QHS PRN  naloxone, 0.2 mg, PRN  ondansetron, 4 mg, Once PRN  ondansetron, 4 mg, Q6H PRN  sodium chloride, 100 mL, Q1H PRN  sodium chloride, 250 mL, PRN     12/10 [facility] PROG NOTE:  Patient Active Hospital Problem List:   Pneumonia due  to COVID-19 virus (12/31/2019)   History of COPD (06/23/2019)    Assessment: No history of chronic respiratory failure.  Patient was in the mid 80s without nasal cannula on.  Agrees with keeping the cannula on to avoid recurrent hypoxia.     Plan: Continue with ceftriaxone 1 g every 24 hour  Continue with dexamethasone 6 mg IV daily  Recommend Robitussin with codeine for cough  Not on Remdesivir on account of end-stage renal disease on hemodialysis; may consider starting a lower dose after discussions with ID  Encouraged to use incentive spirometry  Continue airborne and contact isolation  per Covid protocol    Diarrhea   Assessment-patient admits to having several watery bowel movements through the course of the day and this has been going on for 7 days prior to arrival; Shigella/Salmonella/Campylobacter tests are negative.  Plan-follow-up C. difficile test result  Continue empiric vancomycin p.o. for now  Continue empiric vancomycin 125 mg p.o. every 6 hours (12/10-)  Continue Questran 1 pack twice daily     Iron deficiency anemia secondary to inadequate dietary iron intake (05/14/2019)    Assessment: Chronic and stable    Plan: IV iron versus Aranesp per nephrology     ESRD (end stage renal disease) on dialysis (05/24/2019)    Assessment: Dialysis days are Monday Wednesdays and Fridays; last session 12/10    Plan: Appreciate nephrology inputs     Type 2 diabetes mellitus, with long-term current use of insulin (05/31/2019)    Assessment: Chronic with poor control and A1c of 10.2    Plan: Sliding scale lispro before every meal and at bedtime  Continue Lantus 20 units nightly     GERD (gastroesophageal reflux disease) (05/31/2019)    Assessment: Chronic and appears to be well controlled    Plan: Continue with Protonix 40 mg daily     Hypertension (06/23/2019)    Assessment: Chronic with unclear list of medications at home    Plan: Home medications to be reconciled  For now continue with midodrine 10 mg.  3 times daily     Hyperlipidemia (06/23/2019)    Assessment: Chronic and unknown control    Plan: Continue with home dose of Lipitor 40 mg daily     History of CVA (cerebrovascular accident) (07/04/2019)    Assessment: Chronic with no acute deficits    Plan: Continue with Lipitor 40 mg daily    Analgesia: Tylenol    Nutrition: Consistent carbohydrate diet    GI Prophylaxis: Protonix    DVT Prophylaxis: Heparin    Code Status: Full code    DISPO: Discharge currently on hold on account of malaise, poor appetite, dyspnea and ongoing diarrhea           Playa Fortuna states that  she feels poorly.  Her appetite is extremely poor.  She admits to having cough which is now productive.  She admits to having ongoing diarrhea.  RN notes that her stool is soft but not watery today.    Febrile- None   Eating-poorly due to lack of appetite  Current Facility-Administered Medications   Medication Dose Route Frequency    atorvastatin  40 mg Oral Daily    cefTRIAXone  1 g Intravenous Q24H    cholestyramine  1 packet Oral BID    dexAMETHasone  6 mg Intravenous Daily    dexAMETHasone  10 mg Intravenous Once    fludrocortisone  0.1 mg Oral Daily    heparin (porcine)  5,000 Units Subcutaneous Q12H [facility]    insulin glargine  20 Units Subcutaneous QHS    insulin lispro  1-4 Units Subcutaneous QHS    insulin lispro  1-8 Units Subcutaneous TID AC    miconazole 2 % with zinc oxide   Topical Q6H    midodrine  10 mg Oral TID MEALS    pantoprazole  40 mg Oral QAM AC    senna  8.6 mg Oral QHS    sevelamer  800 mg Oral TID MEALS    vancomycin  125 mg Oral 4 times per day    vitamin C  500 mg Oral Daily     01/02/20 1552 -- 98.3 F (36.8 C) Oral 69 95 % -- 24(Abnormal)  152/67       98.1 F (36.7 C) Axillary 65 95 % -- 23(Abnormal)  158/73    Rhonchi present. No wheezing or rales.       12/11 NEPH NOTE:  CC: follow-up esrd    Assessment:   1. ESRD on HD MWF at [facility], followed by Dr. Randel Pigg dialysed yesterday   2. Mild hypocalcemia  3. Anemia of chronic disease  4. Hypoxemia, shortness of breath due to Covid 19 PNA  - not vaccinated - increased ) 2 requirement5. Watery diarrhea + cdiff  6. HTN, labile      Recommendations:   1 continue rx per primary peam   2 next dialysis Monday    Scheduled Meds: PRN Meds:    atorvastatin, 40 mg, Oral, Daily  cholestyramine, 2 packet, Oral, BID  dexAMETHasone, 6 mg, Intravenous, Daily  dexAMETHasone, 10 mg, Intravenous, Once  fludrocortisone, 0.1 mg, Oral, Daily  heparin (porcine), 5,000 Units, Subcutaneous, Q12H [facility]  insulin glargine, 20 Units,  Subcutaneous, QHS  insulin lispro, 1-4 Units, Subcutaneous, QHS  insulin lispro, 1-8 Units, Subcutaneous, TID AC  miconazole 2 % with zinc oxide, , Topical, Q6H  midodrine, 10 mg, Oral, TID MEALS  pantoprazole, 40 mg, Oral, QAM AC  senna, 8.6 mg, Oral, QHS  sevelamer, 800 mg, Oral, TID MEALS  vancomycin, 125 mg, Oral, 4 times per day  vitamin C, 500 mg, Oral, Daily          Continuous Infusions:   acetaminophen, 650 mg, Q6H PRN   Or  acetaminophen, 650 mg, Q6H PRN  acetaminophen, 650 mg, Once PRN  acetaminophen, 650 mg, Q6H PRN  bisacodyl, 5 mg, Daily PRN  dextrose, 15 g of glucose, PRN   And  dextrose, 12.5 g, PRN   And  glucagon (rDNA), 1 mg, PRN  guaiFENesin-codeine, 5 mL, Q4H PRN  hydrALAZINE, 10 mg, Q6H PRN  melatonin, 3 mg, QHS PRN  naloxone, 0.2 mg, PRN  ondansetron, 4 mg, Q6H PRN        01/03/20  0430 01/02/20  0411 01/01/20  0526   WBC 4.11 3.24 4.30   Hgb 9.0* 8.2* 7.4*   Hematocrit 31.0* 28.0* 24.7*      01/03/20  0430 01/02/20  0411 01/01/20  0526   WBC 4.11 3.24 4.30   Hgb 9.0* 8.2* 7.4*   Hematocrit 31.0* 28.0* 24.7*     /11/21 0825 -- -- -- -- 88 %(Abnormal)  -- -- 3 L/min -- -- -- -- 0 RH   01/03/20 0822 -- -- -- -- 94 % -- -- 3 L/min -- -- -- -- -- RH   01/03/20 0820 -- -- -- -- 97 % -- -- 3 L/min -- -- -- -- -- RH  01/03/20 0634 -- 99.3 F (37.4 C) Axillary 57(Abnormal)  -- -- 19 145/63           Glucose Whole Blood - POCT [[id number]] (Abnormal) Collected: 01/03/20 1155    Updated: 01/03/20 1208    Whole Blood Glucose POCT 229High mg/dL      CBC without differential [[id number]] (Abnormal) Collected: 01/03/20 0430   Specimen: Blood Updated: 01/03/20 0444    WBC 4.11 x10 3/uL     Hgb 9.0Low g/dL     Hematocrit 31.0Low %     Platelets 238 x10 3/uL     RBC 3.16Low x10 6/uL      Basic Metabolic Panel [[id number]] (Abnormal) Collected: 01/03/20 0430   Specimen: Blood Updated: 01/03/20 0542    Glucose 140High mg/dL     BUN 36.0High mg/dL     Creatinine 5.8High mg/dL     Calcium 8.6  mg/dL     Sodium 138 mEq/L     Potassium 4.4 mEq/L     Chloride 99Low mEq/L     CO2 23 mEq/L     Anion Gap 16.0High     GERD (gastroesophageal reflux disease) (05/31/2019)  Assessment: Chronic and appears to be well controlled  Plan: Continue with Protonix 40 mg daily    Hypertension (06/23/2019)  Assessment: Chronic with unclear list of medications at home  Plan: Home medications to be reconciled  For now continue with midodrine 10 mg. 3 times daily    Hyperlipidemia (06/23/2019)  Assessment: Chronic and unknown control  Plan: Continue with home dose of Lipitor 40 mg daily    History of CVA (cerebrovascular accident) (07/04/2019)  Assessment: Chronic with no acute deficits  Plan: Continue with Lipitor 40 mg daily      DISPO:Discharge currently on hold on account of malaise, poor appetite, dyspnea and ongoing diarrhea

## 2020-01-03 NOTE — Consults (Signed)
Infectious diseases consultation    Requesting physicianTarique Zaman   reason for request: Covid/C. Difficile  Date of admission: December 8  Date of consultation: December    This is a 64 year old black female admitted for shortness of breath she tested positive for Covid.  She is unvaccinated.  She also tested positive for C. Difficile  In June she was here for abdominal wound with MRSA.  She was sent home with doxycycline.  She says she has been off antibiotics for weeks.    Past medical history   1.  End-stage renal disease on hemodialysis  She had been on peritoneal dialysis prior to the abdominal wall infection.  2.  COPD  3.  Diabetes  4.  Hypertension  5.  Hyperlipidemia  6.  Sleep apnea  7.  Right knee replacement  8.  Left shoulder replacement    Family history  No family contacts with Covid  History is positive for diabetes and hypertension    Social history  No nicotine or ethanol use    Medications  Lipitor colchicine pharyngitis Trulicity Pepcid Florinef NovoLog midodrine Protonix Senokot Renvela    No antibiotic allergies    Constitutional: No fever  Eyes: No visual loss  HENT: No sore throat  Respiratory: Cough  Cardiovascular: No angina or palpitations  GI:  had 3 watery bowel movements today  GU: No dysuria.  On dialysis  Musculoskeletal: Arthralgias  Integument: No rash or pruritus  Neurologic: No headache syncope or seizures  Endocrine: Diabetes with nephropathy  Lymphatic: Negative  Psychiatric: No depression or anxiety  Positive for dysgeusia    Physical examination  Vital signs: Afebrile blood pressure 170/74 pulse 60  O2 sat 95% on 3 L  General: Nontoxic  Eyes: No conjunctivitis  Lungs: Clear  Heart: Regular rhythm S1-S2 appreciated no murmur rub or gallop  Abdomen is soft nontender liver and spleen not felt bowel sounds are heard  Neuro: Grossly intact    Imaging  Chest x-ray: Hazy consolidation in the lungs and bases    Labs  White count is 4.1 hemoglobin 9 hematocrit 31 platelet count  238,000  BUN 36 creatinine 5.8 CO2 23 lactic acid 1.5  Bilirubin 0.4 alk phos 121 ALT 10 AST 20  Ferritin 3158 C-reactive protein 17.9 procalcitonin 0.7 blood gas: pH 7.48 PCO2 37 PO2 125 on 5 L nasal cannula    Microbiology  Blood cultures negative C. difficile positive    Rapid Covid test positive    Assessment  1.  Covid  2.  C. Difficile    Recommendations  1.  P.o. vancomycin  2.  Check Decadron and heparin  Thank you for consultation

## 2020-01-03 NOTE — Plan of Care (Signed)
PT AO x 4 and follows commands, drowsy at times. NS/ST on tele. Pt was on 2L NC, complained of difficulty breathing at the start of shift, writer raised the [facility] and increased o2 to 3L on NC. On consistent carb diet, had one large BM overnight. Stool sample result came back positive for C-diff. CHG bath performed. Linens, gown and pads changed. Antifungal cream applied to peri/groin area. Pt currently resting without complaints of pain or discomfort. Bed lowest position, call bell within reach, report given to dayshift RN.   Problem: Moderate/High Fall Risk Score >5  Goal: Patient will remain free of falls  Outcome: Progressing  Flowsheets (Taken 01/02/2020 2100)  High (Greater than 13):   HIGH-Consider use of low bed   HIGH-Initiate use of floor mats as appropriate   HIGH-Pharmacy to initiate evaluation and intervention per protocol   HIGH-Apply yellow "Fall Risk" arm band   HIGH-Bed alarm on at all times while patient in bed   HIGH-Visual cue at entrance to patient's room     Problem: Safety  Goal: Patient will be free from injury during hospitalization  Outcome: Progressing  Flowsheets (Taken 01/03/2020 0216)  Patient will be free from injury during hospitalization:   Assess patient's risk for falls and implement fall prevention plan of care per policy   Provide and maintain safe environment   Use appropriate transfer methods   Ensure appropriate safety devices are available at the bedside   Hourly rounding  Goal: Patient will be free from infection during hospitalization  Outcome: Progressing  Flowsheets (Taken 01/03/2020 0216)  Free from Infection during hospitalization:   Assess and monitor for signs and symptoms of infection   Monitor lab/diagnostic results     Problem: Pain  Goal: Pain at adequate level as identified by patient  Outcome: Progressing  Flowsheets (Taken 01/03/2020 0216)  Pain at adequate level as identified by patient:   Identify patient comfort function goal   Assess for risk of  opioid induced respiratory depression, including snoring/sleep apnea. Alert healthcare team of risk factors identified.   Assess pain on admission, during daily assessment and/or before any "as needed" intervention(s)   Evaluate if patient comfort function goal is met   Evaluate patient's satisfaction with pain management progress   Offer non-pharmacological pain management interventions   Include patient/patient care companion in decisions related to pain management as needed     Problem: Compromised Tissue integrity  Goal: Damaged tissue is healing and protected  Outcome: Progressing  Flowsheets (Taken 01/03/2020 0216)  Damaged tissue is healing and protected:   Monitor/assess Braden scale every shift   Provide wound care per wound care algorithm   Reposition patient every 2 hours and as needed unless able to reposition self   Increase activity as tolerated/progressive mobility   Relieve pressure to bony prominences for patients at moderate and high risk   Avoid shearing injuries   Keep intact skin clean and dry   Use bath wipes, not soap and water, for daily bathing   Monitor external devices/tubes for correct placement to prevent pressure, friction and shearing   Monitor patient's hygiene practices   Consult/collaborate with wound care nurse   Utilize specialty bed   Encourage use of lotion/moisturizer on skin  Goal: Nutritional status is improving  Outcome: Progressing  Flowsheets (Taken 01/03/2020 0216)  Nutritional status is improving:   Assist patient with eating   Allow adequate time for meals   Collaborate with Clinical Nutritionist   Include patient/patient care companion in  decisions related to nutrition     Problem: Compromised Hemodynamic Status  Goal: Vital signs and fluid balance maintained/improved  Outcome: Progressing  Flowsheets (Taken 01/03/2020 0216)  Vital signs and fluid balance are maintained/improved:   Position patient for maximum circulation/cardiac output    Monitor/assess vitals and hemodynamic parameters with position changes   Monitor/assess lab values and report abnormal values   Monitor and compare daily weight   Monitor intake and output. Notify LIP if urine output is less than 30 mL/hour.     Problem: Ineffective Airway Clearance  Goal: Airway is maintained  Outcome: Progressing  Flowsheets (Taken 01/03/2020 0216)  Airway is maintained:   Teach/Reinforce use of incentive spirometer 10 times per hour while awake, cough and deep breath as needed   Maintain oxygen level of greater than 92% or as ordered by LIP (reworded)   Reposition patient every 2 hours and as needed unless able to self-reposition     Problem: Inadequate Gas Exchange  Goal: Adequate oxygenation and improved ventilation  Outcome: Progressing  Flowsheets (Taken 01/03/2020 0216)  Adequate oxygenation and improved ventilation:   Assess lung sounds   Monitor SpO2 and treat as needed   Position for maximum ventilatory efficiency   Teach/reinforce use of incentive spirometer 10 times per hour while awake, cough and deep breath as needed   Plan activities to conserve energy: plan rest periods   Increase activity as tolerated/progressive mobility   Consult/collaborate with Respiratory Therapy  Goal: Patent Airway maintained  Outcome: Progressing  Flowsheets (Taken 01/03/2020 0216)  Patent airway maintained:   Position patient for maximum ventilatory efficiency   Reposition patient every 2 hours and as needed unless able to self-reposition     Problem: Renal Instability  Goal: Fluid and electrolyte balance are achieved/maintained  Outcome: Progressing  Flowsheets (Taken 01/03/2020 0216)  Fluid and electrolyte balance are achieved/maintained:   Monitor intake and output every shift   Monitor/assess lab values and report abnormal values   Provide adequate hydration   Monitor daily weight   Assess and reassess fluid and electrolyte status   Assess for confusion/personality changes    Observe for seizure activity and initiate seizure precautions if indicated   Observe for cardiac arrhythmias   Monitor for muscle weakness  Goal: Free from infection  Outcome: Progressing  Flowsheets (Taken 01/03/2020 0216)  Free from infection: Monitor/assess for signs and symptoms of infection     Problem: Patient Receiving Advanced Renal Therapies  Goal: Therapy access site remains intact  Outcome: Progressing  Flowsheets (Taken 01/03/2020 0216)  Therapy access site remains intact:   Assess therapy access site   Change therapy access site dressing as needed

## 2020-01-03 NOTE — Progress Notes (Signed)
HOSPITALIST PROGRESS NOTE  [facility]      Patient: [name]  Date: 01/03/2020    LOS: 3 Days  Admission Date: 12/31/2019   MRN: [id number]  Attending: Murlean Hark, MD  , MPH                    Please contact via [place]: [phone]                    Pager: [phone]     ASSESSMENT/PLAN     [name] is a 64 y.o. female admitted with Pneumonia due to COVID-19 virus    Interval Summary:     [place] [facility] Problems    Diagnosis    Diarrhea    Pneumonia due to COVID-19 virus    History of CVA (cerebrovascular accident)    History of COPD    Hyperlipidemia    Hypertension    GERD (gastroesophageal reflux disease)    Type 2 diabetes mellitus, with long-term current use of insulin    ESRD (end stage renal disease) on dialysis    Iron deficiency anemia secondary to inadequate dietary iron intake       Patient Active Hospital Problem List:   Pneumonia due to COVID-19 virus (12/31/2019)   History of COPD (06/23/2019)    Assessment: No history of chronic respiratory failure.  Patient was in the mid 80s without nasal cannula on.  Agrees with keeping the cannula on to avoid recurrent hypoxia.     Plan: Stop ceftriaxone  Continue with dexamethasone 6 mg IV daily  Recommend Robitussin with codeine for cough  Not on Remdesivir on account of end-stage renal disease on hemodialysis; may consider starting a lower dose after discussions with ID  Encouraged to use incentive spirometry  Continue airborne and contact isolation per Covid protocol    Diarrhea   Assessment-patient admits to having several watery bowel movements through the course of the day and this has been going on for 7 days prior to arrival; Confirmed to be C diff. Shigella/Salmonella/Campylobacter tests are negative.  ID on board  Plan-Continue vancomycin p.o. for now (12/09-)  Continue Questran 1 pack twice daily  Discussed with Dr. Lahoma Crocker, infectious disease  Stop ceftriaxone as above     Iron  deficiency anemia secondary to inadequate dietary iron intake (05/14/2019)    Assessment: Chronic and stable    Plan: IV iron versus Aranesp per nephrology     ESRD (end stage renal disease) on dialysis (05/24/2019)    Assessment: Dialysis days are Monday Wednesdays and Fridays; last session 12/10    Plan: Appreciate nephrology inputs     Type 2 diabetes mellitus, with long-term current use of insulin (05/31/2019)    Assessment: Chronic with poor control and A1c of 10.2    Plan: Sliding scale lispro before every meal and at bedtime  Continue Lantus 20 units nightly     GERD (gastroesophageal reflux disease) (05/31/2019)    Assessment: Chronic and appears to be well controlled    Plan: Continue with Protonix 40 mg daily     Hypertension (06/23/2019)    Assessment: Chronic with unclear list of medications at home    Plan: Home medications to be reconciled  For now continue with midodrine 10 mg.  3 times daily     Hyperlipidemia (06/23/2019)    Assessment:  Chronic and unknown control    Plan: Continue with home dose of Lipitor 40 mg daily     History of CVA (cerebrovascular accident) (07/04/2019)    Assessment: Chronic with no acute deficits    Plan: Continue with Lipitor 40 mg daily    Analgesia: Tylenol    Nutrition: Consistent carbohydrate diet    GI Prophylaxis: Protonix    DVT Prophylaxis: Heparin    Code Status: Full code    DISPO:     12/10: Discharge currently on hold on account of malaise, poor appetite, dyspnea and ongoing diarrhea    12/11: Anticipate 2 to 3 days for improvement of symptoms of C. difficile diarrhea with current regimen.  PT/OT orders placed       [name] states that she feels somewhat better today.  Still having frequent watery stools.  Breathing seems to be getting better.  He still has a poor appetite    Febrile- None   Eating-poorly due to lack of appetite  Coughing- None   Diarrhea- None   Sleeping - Well   Voiding - Well        MEDICATIONS     Current  Facility-Administered Medications   Medication Dose Route Frequency    atorvastatin  40 mg Oral Daily    cefTRIAXone  1 g Intravenous Q24H    cholestyramine  1 packet Oral BID    dexAMETHasone  6 mg Intravenous Daily    dexAMETHasone  10 mg Intravenous Once    fludrocortisone  0.1 mg Oral Daily    heparin (porcine)  5,000 Units Subcutaneous Q12H [place]    insulin glargine  20 Units Subcutaneous QHS    insulin lispro  1-4 Units Subcutaneous QHS    insulin lispro  1-8 Units Subcutaneous TID AC    miconazole 2 % with zinc oxide   Topical Q6H    midodrine  10 mg Oral TID MEALS    pantoprazole  40 mg Oral QAM AC    senna  8.6 mg Oral QHS    sevelamer  800 mg Oral TID MEALS    vancomycin  125 mg Oral 4 times per day    vitamin C  500 mg Oral Daily       PHYSICAL EXAM     Vitals:    01/03/20 0634   BP: 145/63   Pulse: (!) 57   Resp: 19   Temp: 99.3 F (37.4 C)   SpO2:        Temperature: Temp  Min: 97.6 F (36.4 C)  Max: 99.3 F (37.4 C)  Pulse: Pulse  Min: 57  Max: 71  Respiratory: Resp  Min: 16  Max: 24  Non-Invasive BP: BP  Min: 115/67  Max: 189/73  Pulse Oximetry SpO2  Min: 90 %  Max: 100 %    Intake and Output Summary (Last 24 hours) at Date Time    Intake/Output Summary (Last 24 hours) at 01/03/2020 1610  Last data filed at 01/03/2020 0209  Gross per 24 hour   Intake 450 ml   Output 3050 ml   Net -2600 ml       Physical Exam  Constitutional:       General: She is not in acute distress.     Appearance: She is not diaphoretic.   HENT:      Head: Normocephalic and atraumatic.      Right Ear: External ear normal.      Left  Ear: External ear normal.   Eyes:      Pupils: Pupils are equal, round, and reactive to light.   Neck:      Thyroid: No thyromegaly.      Vascular: No JVD.      Trachea: No tracheal deviation.   Cardiovascular:      Rate and Rhythm: Normal rate and regular rhythm.      Heart sounds: Normal heart sounds. No murmur heard.  No friction rub. No gallop.    Pulmonary:      Effort: Pulmonary  effort is normal. No respiratory distress.      Breath sounds: No stridor. Rhonchi present. No wheezing or rales.      Comments: Course rhonchorous breath sounds when patient attempts to cough  Chest:      Chest wall: No tenderness.   Abdominal:      General: Bowel sounds are normal. There is no distension.      Palpations: Abdomen is soft. There is no mass.      Tenderness: There is no abdominal tenderness. There is no guarding or rebound.   Musculoskeletal:         General: No tenderness or deformity. Normal range of motion.      Cervical back: Normal range of motion and neck supple.   Lymphadenopathy:      Cervical: No cervical adenopathy.   Skin:     General: Skin is warm and dry.      Coloration: Skin is not pale.      Findings: No erythema or rash.   Neurological:      Mental Status: She is alert and oriented to person, place, and time.      Cranial Nerves: No cranial nerve deficit.      Motor: No abnormal muscle tone.      Coordination: Coordination normal.      Gait: Gait is intact.      Deep Tendon Reflexes: Reflexes are normal and symmetric.   Psychiatric:         Mood and Affect: Mood and affect normal.         Cognition and Memory: Memory normal.         Judgment: Judgment normal.       LABS     Recent Labs   Lab 01/03/20  0430 01/02/20  0411 01/01/20  0526   WBC 4.11 3.24 4.30   RBC 3.16* 2.86* 2.56*   Hgb 9.0* 8.2* 7.4*   Hematocrit 31.0* 28.0* 24.7*   MCV 98.1* 97.9* 96.5*   Platelets 238 190 148       Recent Labs   Lab 01/03/20  0430 01/02/20  0411 01/01/20  0051 12/31/19  2012   Sodium 138 140 138 136   Potassium 4.4 4.1 3.7 3.7   Chloride 99* 99* 94* 92*   CO2 23 27 25 25    BUN 36.0* 55.0* 38.0* 33.0*   Creatinine 5.8* 7.7* 5.9* 5.4*   Glucose 140* 208* 244* 265*   Calcium 8.6 7.7* 7.9* 7.9*       Recent Labs   Lab 12/31/19  2012   ALT 10   AST (SGOT) 20   Bilirubin, Total 0.4   Albumin 2.7*   Alkaline Phosphatase 121*       Recent Labs   Lab 01/01/20  0526 01/01/20  0051 12/31/19  2012   Troponin  I 0.05 0.05 0.06*             Microbiology  Results (last 15 days)     Procedure Component Value Units Date/Time    Clostridium difficile toxin B PCR [[id number]]  (Abnormal) Collected: 01/01/20 2336    Order Status: Completed Specimen: Stool Updated: 01/03/20 0130     Stool Clostridium difficile Toxin B PCR Positive     Comment: Test performed using a BDMax PCR Assay. Due to the high  sensitivity of the assay, repeat testing is not recommended  and will be cancelled following [place] policy. Molecular  detection of C. difficile is not recommended as a test of cure  or for surveillance purposes. A positive test may indicate  infection or colonization with C. difficile. Testing will not  be performed on formed stool.         Narrative:      Patient's current admission began:->less than or equal to 3  days ago  Cancel C-Diff order if no diarrhea in 12 hours?->Yes    cdiff ok to process per dr Manson Allan    Stool Cryptosporidium / Giardia Antigen [[id number]] Collected: 01/01/20 2336    Order Status: Completed Specimen: Stool Updated: 01/02/20 0900    Narrative:      ORDER#: [id number]                                    ORDERED BY: Murlean Hark  SOURCE: Stool                                        COLLECTED:  01/01/20 23:36  ANTIBIOTICS AT COLL.:                                RECEIVED :  01/02/20 05:24  Cryptosporidium/Giardia Antigen            FINAL       01/02/20 09:00  01/02/20   Negative for Cryptosporidium parvum antigen             Negative for Giardia lamblia antigen      Stool for Salm/Shig/Campy/Shiga PCR [[id number]] Collected: 01/01/20 2336    Order Status: Completed Specimen: Stool Updated: 01/02/20 1627     Stool Salmonella Species by PCR Negative     Stool Shigella Species/Enteroinvasive Escherichia coli PCR Negative     Stool Campylobacter jejunii/coli by PCR Negative     Stool Shiga Toxin by PCR Negative     Comment: Testing performed using a BDMax multiplex PCR panel which  detects bacterial DNA. A positive  result does not necessarily  indicate the presence of viable organisms. Positive results  do not rule out co-infection with other organisms that are  not detected by this test. This panel does not differentiate  between Shigella spp. and Enteroinvasive Escherichia coli  (EIEC), which are closely related and may cause similar  illness.  A result of Shiga Toxin POSITIVE, Shigella/EIEC NEGATIVE is  most likely due to the presence of Shiga toxin-producing  E. coli (STEC). Shigella species isolated in the [place]  are rarely positive for Shiga Toxin.  Serotyping of Salmonella, Shigella, or Shiga Toxin DNA  positive samples will be performed by [facility] for public health  surveillance purposes.  Serotyping results are available upon request.         COVID-19 (SARS-CoV-2) and Influenza A/B, NAA (  Liat Rapid) [[id number]]  (Abnormal) Collected: 12/31/19 2054    Order Status: Completed Specimen: Culturette from Nasopharyngeal Updated: 12/31/19 2213     Purpose of COVID testing Diagnostic -PUI     SARS-CoV-2 Specimen Source Nasopharyngeal     SARS CoV 2 Overall Result Detected     Comment: Positive COVID results called to Dr.He.  Readback confirmed by [id number] at 22:13  12/31/2019  Test performed using the Roche cobas Liat SARS-CoV-2 assay. This assay is  only for use under the Food and Drug Administrations Emergency Use  Authorization. This is a real-time RT-PCR assay for the qualitative  detection of SARS-CoV-2 RNA. Viral nucleic acids may persist in vivo,  independent of viability. Detection of viral nucleic acid does not imply the  presence of infectious virus, or that virus nucleic acid is the cause of  clinical symptoms. Negative results do not preclude SARS-CoV-2 infection and  should not be used as the sole basis for diagnosis, treatment or other  patient management decisions. Negative results must be combined with  clinical observations, patient history, and/or epidemiological information.  Invalid results may be due  to inhibiting substances in the specimen and  recollection should occur. Please see Fact Sheets for patients and providers  located:  https://www.benson-chung.com/.          Influenza A Not Detected     Comment: Influenza A  NOT Detected results should be considered presumptive in  samples that have a positive SARS-CoV-2 (COVID-19) result. If co-infection  with influenza A virus is suspected and influenza virus detection would  change clinical management, please order influenza specific testing.          Influenza B Not Detected     Comment: Influenza B NOT Detected results should be considered presumptive in samples  that have a positive SARS-CoV-2 (COVID-19) result. If co-infection with  influenza B virus is suspected and influenza virus detection would change  clinical management, please order influenza specific testing.  Test performed using the Roche cobas Liat SARS-CoV-2 & Influenza A/B assay.  This assay is only for use under the Food and Drug Administrations  Emergency Use Authorization. This is a multiplex real-time RT-PCR assay  intended for the simultaneous in vitro qualitative detection and  differentiation of SARS-CoV-2, influenza A, and influenza B virus RNA. Viral  nucleic acids may persist in vivo, independent of viability. Detection of  viral nucleic acid does not imply the presence of infectious virus, or that  virus nucleic acid is the cause of clinical symptoms. Negative results do  not preclude SARS-CoV-2, influenza A, and/or influenza B infection and  should not be used as the sole basis for diagnosis, treatment or other  patient management decisions. Negative results must be combined with  clinical observations, patient history, and/or epidemiological information.  Invalid results may be due to inhibiting substances in the specimen and  recollection should occur. Please see Fact Sheets for patients and providers  located: http://olson-hall.info/.          Narrative:      o Collect and clearly label specimen type:  o PREFERRED-Upper respiratory specimen: One Nasopharyngeal  Swab in Transport Media.  o Hand deliver to laboratory ASAP  Diagnostic -PUI    Blood Culture #1 [[id number]] Collected: 12/31/19 2012    Order Status: Completed Specimen: Arm from Blood, Venipuncture Updated: 01/03/20 0321    Narrative:      ORDER#: [id number]  ORDERED BY: HE, ALBERT  SOURCE: Blood, Venipuncture Arm                      COLLECTED:  12/31/19 20:12  ANTIBIOTICS AT COLL.:                                RECEIVED :  01/01/20 02:23  Culture Blood Aerobic and Anaerobic        PRELIM      01/03/20 03:21  01/02/20   No Growth after 1 day/s of incubation.  01/03/20   No Growth after 2 day/s of incubation.      Blood Culture #2 [[id number]] Collected: 12/31/19 2012    Order Status: Completed Specimen: Arm from Blood, Venipuncture Updated: 01/03/20 0321    Narrative:      ORDER#: [id number]                                    ORDERED BY: HE, ALBERT  SOURCE: Blood, Venipuncture Arm                      COLLECTED:  12/31/19 20:12  ANTIBIOTICS AT COLL.:                                RECEIVED :  01/01/20 02:23  Culture Blood Aerobic and Anaerobic        PRELIM      01/03/20 03:21  01/02/20   No Growth after 1 day/s of incubation.  01/03/20   No Growth after 2 day/s of incubation.             Chest AP Portable    Result Date: 12/31/2019  Bilateral hazy and consolidation throughout the peripheries of the midlungs and lung bases which represent atypical/viral pneumonia, or asymmetric pulmonary edema. Raphael Gibney, DO  12/31/2019 8:02 PM      Echo Results     None          I have personally reviewed the patient's labs, medications, and imaging    Total visit time = 45 mins ; more than 50% spent counseling/coordinating care    Signed,    Murlean Hark MD, MPH  8:23 AM 01/03/2020   Please contact via [place]: [phone]  Pager: [phone]       This chart was  generated using a medical voice-recognition software which does not employ spell-checking or grammar-checking features. It was dictated, all or in part, in a busy and often noisy patient care environment. I have taken all usual measures to dictate carefully and to review all aspects of this chart. Nonetheless, given the known and well-documented performance characteristics of voice recognition software in such patient care environments, this dictation still may contain unrecognized and wholly unintended errors or omissions.

## 2020-01-04 LAB — BASIC METABOLIC PANEL
Anion Gap: 18 — ABNORMAL HIGH (ref 5.0–15.0)
BUN: 52 mg/dL — ABNORMAL HIGH (ref 7.0–19.0)
CO2: 20 mEq/L — ABNORMAL LOW (ref 22–29)
Calcium: 8.6 mg/dL (ref 8.5–10.5)
Chloride: 100 mEq/L (ref 100–111)
Creatinine: 7.6 mg/dL — ABNORMAL HIGH (ref 0.6–1.0)
Glucose: 245 mg/dL — ABNORMAL HIGH (ref 70–100)
Potassium: 5.1 mEq/L (ref 3.5–5.1)
Sodium: 138 mEq/L (ref 136–145)

## 2020-01-04 LAB — CBC
Absolute NRBC: 0 10*3/uL (ref 0.00–0.00)
Hematocrit: 29.8 % — ABNORMAL LOW (ref 34.7–43.7)
Hgb: 8.5 g/dL — ABNORMAL LOW (ref 11.4–14.8)
MCH: 28.1 pg (ref 25.1–33.5)
MCHC: 28.5 g/dL — ABNORMAL LOW (ref 31.5–35.8)
MCV: 98.7 fL — ABNORMAL HIGH (ref 78.0–96.0)
MPV: 11.2 fL (ref 8.9–12.5)
Nucleated RBC: 0 /100 WBC (ref 0.0–0.0)
Platelets: 257 10*3/uL (ref 142–346)
RBC: 3.02 10*6/uL — ABNORMAL LOW (ref 3.90–5.10)
RDW: 14 % (ref 11–15)
WBC: 4.22 10*3/uL (ref 3.10–9.50)

## 2020-01-04 LAB — GFR: EGFR: 6.5

## 2020-01-04 LAB — GLUCOSE WHOLE BLOOD - POCT
Whole Blood Glucose POCT: 196 mg/dL — ABNORMAL HIGH (ref 70–100)
Whole Blood Glucose POCT: 153 mg/dL — ABNORMAL HIGH (ref 70–100)
Whole Blood Glucose POCT: 367 mg/dL — ABNORMAL HIGH (ref 70–100)
Whole Blood Glucose POCT: 317 mg/dL — ABNORMAL HIGH (ref 70–100)

## 2020-01-04 LAB — HEMOLYSIS INDEX: Hemolysis Index: 55 — ABNORMAL HIGH (ref 0–18)

## 2020-01-04 MED ORDER — MIDODRINE HCL 5 MG PO TABS
10.0000 mg | ORAL_TABLET | Freq: Three times a day (TID) | ORAL | Status: DC | PRN
Start: 2020-01-04 — End: 2020-01-08
  Administered 2020-01-07: 16:00:00 10 mg via ORAL
  Filled 2020-01-04: qty 2

## 2020-01-04 MED ORDER — DARBEPOETIN ALFA 60 MCG/0.3ML IJ SOSY
60.0000 ug | PREFILLED_SYRINGE | INTRAMUSCULAR | Status: DC
Start: 2020-01-04 — End: 2020-01-08
  Administered 2020-01-05: 07:00:00 60 ug via SUBCUTANEOUS
  Filled 2020-01-04 (×2): qty 0.3

## 2020-01-04 MED ORDER — ZINC OXIDE 40 % EX PSTE
PASTE | CUTANEOUS | Status: DC | PRN
Start: 2020-01-04 — End: 2020-01-08
  Filled 2020-01-04 (×2): qty 57

## 2020-01-04 NOTE — Plan of Care (Signed)
Problem: Compromised Tissue integrity  Goal: Damaged tissue is healing and protected  Outcome: Progressing  Flowsheets (Taken 01/04/2020 2002)  Damaged tissue is healing and protected:   Monitor/assess Braden scale every shift   Increase activity as tolerated/progressive mobility   Use incontinence wipes for cleaning urine, stool and caustic drainage. Foley care as needed     Problem: Ineffective Airway Clearance  Goal: Airway is maintained  Outcome: Progressing     Problem: Renal Instability  Goal: Fluid and electrolyte balance are achieved/maintained  Outcome: Progressing  Flowsheets (Taken 01/04/2020 2002)  Fluid and electrolyte balance are achieved/maintained:   Monitor intake and output every shift   Provide adequate hydration   Assess and reassess fluid and electrolyte status   Assess for confusion/personality changes   Pt a.o, tolerated medication, am care, titrated off of NC and on room air now.  Tolerated some breakfast and lunch, most of the glucerna.  PT spoke with daughter through shift. Pt seen by Dr at bedside, c/o burning near rectum related to MASD. Dr entered order. Pt tolerated sitting in chair from 1345 - end of shift.

## 2020-01-04 NOTE — Progress Notes (Signed)
HOSPITALIST PROGRESS NOTE  [facility]      Patient: [name]  Date: 01/04/2020    LOS: 4 Days  Admission Date: 12/31/2019   MRN: [id number]  Attending: Murlean Hark, MD  , MPH                    Please contact via [place]: [phone]                    Pager: [phone]     ASSESSMENT/PLAN     [name] is a 64 y.o. female admitted with Pneumonia due to COVID-19 virus    Interval Summary:     [facility] Problems    Diagnosis    Diarrhea    Pneumonia due to COVID-19 virus    History of CVA (cerebrovascular accident)    History of COPD    Hyperlipidemia    Hypertension    GERD (gastroesophageal reflux disease)    Type 2 diabetes mellitus, with long-term current use of insulin    ESRD (end stage renal disease) on dialysis    Iron deficiency anemia secondary to inadequate dietary iron intake       Patient Active Hospital Problem List:   Pneumonia due to COVID-19 virus (12/31/2019)   History of COPD (06/23/2019)    Assessment: No history of chronic respiratory failure.  Patient has been weaned down to room air    Plan: Stop ceftriaxone  Continue with dexamethasone 6 mg IV daily  Recommend Robitussin with codeine for cough  Does not meet criteria for Remedesivir anymore  Encouraged to use incentive spirometry  Continue airborne and contact isolation per Covid protocol    Diarrhea   Assessment-Confirmed to be C diff. patient admits to having several watery bowel movements through the course of the day and night. So far 9 BMs overnight.  Shigella/Salmonella/Campylobacter tests are negative.  ID on board  Plan-Continue vancomycin p.o. for now (12/09-)  Continue Questran 1 pack twice daily  Discussed with Dr. Lahoma Crocker, infectious disease  Desitin for peri-anal irritation  Continue to hold other antibiotics     Iron deficiency anemia secondary to inadequate dietary iron intake (05/14/2019)    Assessment: Chronic and stable    Plan: IV iron versus Aranesp per  nephrology     ESRD (end stage renal disease) on dialysis (05/24/2019)    Assessment: Dialysis days are Monday Wednesdays and Fridays; last session 12/10    Plan: Appreciate nephrology inputs     Type 2 diabetes mellitus, with long-term current use of insulin (05/31/2019)    Assessment: Chronic with poor control and A1c of 10.2    Plan: Sliding scale lispro before every meal and at bedtime  Continue Lantus 20 units nightly     GERD (gastroesophageal reflux disease) (05/31/2019)    Assessment: Chronic and appears to be well controlled    Plan: Continue with Protonix 40 mg daily     Hypertension (06/23/2019)    Assessment: Chronic with unclear list of medications at home    Plan: Home medications to be reconciled  For now continue with midodrine 10 mg.  3 times daily     Hyperlipidemia (06/23/2019)    Assessment: Chronic and unknown control    Plan: Continue with home dose of Lipitor 40 mg daily     History of CVA (cerebrovascular accident) (  07/04/2019)    Assessment: Chronic with no acute deficits    Plan: Continue with Lipitor 40 mg daily    Analgesia: Tylenol    Nutrition: Consistent carbohydrate diet    GI Prophylaxis: Protonix    DVT Prophylaxis: Heparin    Code Status: Full code    DISPO:     12/10: Discharge currently on hold on account of malaise, poor appetite, dyspnea and ongoing diarrhea    12/11: Anticipate 2 to 3 days for improvement of symptoms of C. difficile diarrhea with current regimen.  PT/OT orders placed    12/12: Continue inpatient status for multiple semi-formed BMs in the past 24 hours. Anticipate 2-3 days before discharge.        SUBJECTIVE     [name] that she is feeling much better but notes that her bottom is hurting from raw skin from the diarrhea. Appetite is improving but not normal yet.     Febrile- None   Eating-better with improving appetite  Coughing- None   Diarrhea- None   Sleeping - Well   Voiding - Well        MEDICATIONS     Current Facility-Administered Medications    Medication Dose Route Frequency    atorvastatin  40 mg Oral Daily    cholestyramine  2 packet Oral BID    dexAMETHasone  6 mg Intravenous Daily    dexAMETHasone  10 mg Intravenous Once    fludrocortisone  0.1 mg Oral Daily    heparin (porcine)  5,000 Units Subcutaneous Q12H [place]    insulin glargine  20 Units Subcutaneous QHS    insulin lispro  1-4 Units Subcutaneous QHS    insulin lispro  1-8 Units Subcutaneous TID AC    miconazole 2 % with zinc oxide   Topical Q6H    midodrine  10 mg Oral TID MEALS    pantoprazole  40 mg Oral QAM AC    senna  8.6 mg Oral QHS    sevelamer  800 mg Oral TID MEALS    vancomycin  125 mg Oral 4 times per day    vitamin C  500 mg Oral Daily       PHYSICAL EXAM     Vitals:    01/04/20 0616   BP: 154/58   Pulse: (!) 55   Resp:    Temp: 97.5 F (36.4 C)   SpO2: 98%       Temperature: Temp  Min: 97.4 F (36.3 C)  Max: 98.7 F (37.1 C)  Pulse: Pulse  Min: 55  Max: 64  Respiratory: Resp  Min: 18  Max: 20  Non-Invasive BP: BP  Min: 154/58  Max: 170/74  Pulse Oximetry SpO2  Min: 97 %  Max: 98 %    Intake and Output Summary (Last 24 hours) at Date Time    Intake/Output Summary (Last 24 hours) at 01/04/2020 0912  Last data filed at 01/03/2020 1754  Gross per 24 hour   Intake 400 ml   Output 250 ml   Net 150 ml       Physical Exam  Constitutional:       General: She is not in acute distress.     Appearance: She is not diaphoretic.      Comments: Sitting up on the chair.    HENT:      Head: Normocephalic and atraumatic.      Right Ear: External ear normal.      Left Ear: External ear  normal.   Eyes:      Pupils: Pupils are equal, round, and reactive to light.   Neck:      Thyroid: No thyromegaly.      Vascular: No JVD.      Trachea: No tracheal deviation.   Cardiovascular:      Rate and Rhythm: Normal rate and regular rhythm.      Heart sounds: Normal heart sounds. No murmur heard.  No friction rub. No gallop.    Pulmonary:      Effort: Pulmonary effort is normal. No respiratory  distress.      Breath sounds: No stridor. Rhonchi present. No wheezing or rales.      Comments: Course rhonchorous breath sounds when patient attempts to cough  Chest:      Chest wall: No tenderness.   Abdominal:      General: Bowel sounds are normal. There is no distension.      Palpations: Abdomen is soft. There is no mass.      Tenderness: There is no abdominal tenderness. There is no guarding or rebound.   Musculoskeletal:         General: No tenderness or deformity. Normal range of motion.      Cervical back: Normal range of motion and neck supple.   Lymphadenopathy:      Cervical: No cervical adenopathy.   Skin:     General: Skin is warm and dry.      Coloration: Skin is not pale.      Findings: No erythema or rash.   Neurological:      Mental Status: She is alert and oriented to person, place, and time.      Cranial Nerves: No cranial nerve deficit.      Motor: No abnormal muscle tone.      Coordination: Coordination normal.      Gait: Gait is intact.      Deep Tendon Reflexes: Reflexes are normal and symmetric.   Psychiatric:         Mood and Affect: Mood and affect normal.         Cognition and Memory: Memory normal.         Judgment: Judgment normal.       LABS     Recent Labs   Lab 01/04/20  0353 01/03/20  0430 01/02/20  0411   WBC 4.22 4.11 3.24   RBC 3.02* 3.16* 2.86*   Hgb 8.5* 9.0* 8.2*   Hematocrit 29.8* 31.0* 28.0*   MCV 98.7* 98.1* 97.9*   Platelets 257 238 190       Recent Labs   Lab 01/04/20  0353 01/03/20  0430 01/02/20  0411 01/01/20  0051 12/31/19  2012   Sodium 138 138 140 138 136   Potassium 5.1 4.4 4.1 3.7 3.7   Chloride 100 99* 99* 94* 92*   CO2 20* 23 27 25 25    BUN 52.0* 36.0* 55.0* 38.0* 33.0*   Creatinine 7.6* 5.8* 7.7* 5.9* 5.4*   Glucose 245* 140* 208* 244* 265*   Calcium 8.6 8.6 7.7* 7.9* 7.9*       Recent Labs   Lab 12/31/19  2012   ALT 10   AST (SGOT) 20   Bilirubin, Total 0.4   Albumin 2.7*   Alkaline Phosphatase 121*       Recent Labs   Lab 01/01/20  0526 01/01/20  0051  12/31/19  2012   Troponin I 0.05 0.05 0.06*  Microbiology Results (last 15 days)     Procedure Component Value Units Date/Time    Clostridium difficile toxin B PCR [[id number]]  (Abnormal) Collected: 01/01/20 2336    Order Status: Completed Specimen: Stool Updated: 01/03/20 0130     Stool Clostridium difficile Toxin B PCR Positive     Comment: Test performed using a BDMax PCR Assay. Due to the high  sensitivity of the assay, repeat testing is not recommended  and will be cancelled following [place] policy. Molecular  detection of C. difficile is not recommended as a test of cure  or for surveillance purposes. A positive test may indicate  infection or colonization with C. difficile. Testing will not  be performed on formed stool.         Narrative:      Patient's current admission began:->less than or equal to 3  days ago  Cancel C-Diff order if no diarrhea in 12 hours?->Yes    cdiff ok to process per dr Manson Allan    Stool Cryptosporidium / Giardia Antigen [[id number]] Collected: 01/01/20 2336    Order Status: Completed Specimen: Stool Updated: 01/02/20 0900    Narrative:      ORDER#: [id number]                                    ORDERED BY: Murlean Hark  SOURCE: Stool                                        COLLECTED:  01/01/20 23:36  ANTIBIOTICS AT COLL.:                                RECEIVED :  01/02/20 05:24  Cryptosporidium/Giardia Antigen            FINAL       01/02/20 09:00  01/02/20   Negative for Cryptosporidium parvum antigen             Negative for Giardia lamblia antigen      Stool for Salm/Shig/Campy/Shiga PCR [[id number]] Collected: 01/01/20 2336    Order Status: Completed Specimen: Stool Updated: 01/02/20 1627     Stool Salmonella Species by PCR Negative     Stool Shigella Species/Enteroinvasive Escherichia coli PCR Negative     Stool Campylobacter jejunii/coli by PCR Negative     Stool Shiga Toxin by PCR Negative     Comment: Testing performed using a BDMax multiplex PCR panel which  detects  bacterial DNA. A positive result does not necessarily  indicate the presence of viable organisms. Positive results  do not rule out co-infection with other organisms that are  not detected by this test. This panel does not differentiate  between Shigella spp. and Enteroinvasive Escherichia coli  (EIEC), which are closely related and may cause similar  illness.  A result of Shiga Toxin POSITIVE, Shigella/EIEC NEGATIVE is  most likely due to the presence of Shiga toxin-producing  E. coli (STEC). Shigella species isolated in the [place]  are rarely positive for Shiga Toxin.  Serotyping of Salmonella, Shigella, or Shiga Toxin DNA  positive samples will be performed by [facility] for public health  surveillance purposes.  Serotyping results are available upon request.         COVID-19 (SARS-CoV-2) and Influenza A/B,  NAA (Liat Rapid) [[id number]]  (Abnormal) Collected: 12/31/19 2054    Order Status: Completed Specimen: Culturette from Nasopharyngeal Updated: 12/31/19 2213     Purpose of COVID testing Diagnostic -[facility]I     SARS-CoV-2 Specimen Source Nasopharyngeal     SARS CoV 2 Overall Result Detected     Comment: Positive COVID results called to Dr.He.  Readback confirmed by [id number] at 22:13  12/31/2019  Test performed using the Roche cobas Liat SARS-CoV-2 assay. This assay is  only for use under the Food and Drug Administrations Emergency Use  Authorization. This is a real-time RT-PCR assay for the qualitative  detection of SARS-CoV-2 RNA. Viral nucleic acids may persist in vivo,  independent of viability. Detection of viral nucleic acid does not imply the  presence of infectious virus, or that virus nucleic acid is the cause of  clinical symptoms. Negative results do not preclude SARS-CoV-2 infection and  should not be used as the sole basis for diagnosis, treatment or other  patient management decisions. Negative results must be combined with  clinical observations, patient history, and/or epidemiological  information.  Invalid results may be due to inhibiting substances in the specimen and  recollection should occur. Please see Fact Sheets for patients and providers  located:  https://www.benson-chung.com/.          Influenza A Not Detected     Comment: Influenza A  NOT Detected results should be considered presumptive in  samples that have a positive SARS-CoV-2 (COVID-19) result. If co-infection  with influenza A virus is suspected and influenza virus detection would  change clinical management, please order influenza specific testing.          Influenza B Not Detected     Comment: Influenza B NOT Detected results should be considered presumptive in samples  that have a positive SARS-CoV-2 (COVID-19) result. If co-infection with  influenza B virus is suspected and influenza virus detection would change  clinical management, please order influenza specific testing.  Test performed using the Roche cobas Liat SARS-CoV-2 & Influenza A/B assay.  This assay is only for use under the Food and Drug Administrations  Emergency Use Authorization. This is a multiplex real-time RT-PCR assay  intended for the simultaneous in vitro qualitative detection and  differentiation of SARS-CoV-2, influenza A, and influenza B virus RNA. Viral  nucleic acids may persist in vivo, independent of viability. Detection of  viral nucleic acid does not imply the presence of infectious virus, or that  virus nucleic acid is the cause of clinical symptoms. Negative results do  not preclude SARS-CoV-2, influenza A, and/or influenza B infection and  should not be used as the sole basis for diagnosis, treatment or other  patient management decisions. Negative results must be combined with  clinical observations, patient history, and/or epidemiological information.  Invalid results may be due to inhibiting substances in the specimen and  recollection should occur. Please see Fact Sheets for patients and providers  located:  http://olson-hall.info/.         Narrative:      o Collect and clearly label specimen type:  o PREFERRED-Upper respiratory specimen: One Nasopharyngeal  Swab in Transport Media.  o Hand deliver to laboratory ASAP  Diagnostic -[facility]I    Blood Culture #1 [[id number]] Collected: 12/31/19 2012    Order Status: Completed Specimen: Arm from Blood, Venipuncture Updated: 01/04/20 0321    Narrative:      ORDER#: [id number]  ORDERED BY: HE, ALBERT  SOURCE: Blood, Venipuncture Arm                      COLLECTED:  12/31/19 20:12  ANTIBIOTICS AT COLL.:                                RECEIVED :  01/01/20 02:23  Culture Blood Aerobic and Anaerobic        PRELIM      01/04/20 03:21  01/02/20   No Growth after 1 day/s of incubation.  01/03/20   No Growth after 2 day/s of incubation.  01/04/20   No Growth after 3 day/s of incubation.      Blood Culture #2 [[id number]] Collected: 12/31/19 2012    Order Status: Completed Specimen: Arm from Blood, Venipuncture Updated: 01/04/20 0321    Narrative:      ORDER#: [id number]                                    ORDERED BY: HE, ALBERT  SOURCE: Blood, Venipuncture Arm                      COLLECTED:  12/31/19 20:12  ANTIBIOTICS AT COLL.:                                RECEIVED :  01/01/20 02:23  Culture Blood Aerobic and Anaerobic        PRELIM      01/04/20 03:21  01/02/20   No Growth after 1 day/s of incubation.  01/03/20   No Growth after 2 day/s of incubation.  01/04/20   No Growth after 3 day/s of incubation.             Chest AP Portable    Result Date: 12/31/2019  Bilateral hazy and consolidation throughout the peripheries of the midlungs and lung bases which represent atypical/viral pneumonia, or asymmetric pulmonary edema. Raphael Gibney, DO  12/31/2019 8:02 PM      Echo Results     None          I have personally reviewed the patient's labs, medications, and imaging    Total visit time = 45 mins ; more than 50% spent counseling/coordinating  care    Signed,    Murlean Hark MD, MPH  9:12 AM 01/04/2020   Please contact via [place]: [phone]  Pager: [phone]       This chart was generated using a medical voice-recognition software which does not employ spell-checking or grammar-checking features. It was dictated, all or in part, in a busy and often noisy patient care environment. I have taken all usual measures to dictate carefully and to review all aspects of this chart. Nonetheless, given the known and well-documented performance characteristics of voice recognition software in such patient care environments, this dictation still may contain unrecognized and wholly unintended errors or omissions.

## 2020-01-04 NOTE — Plan of Care (Signed)
Pt AO x 4 and follows commands, drowsy at times. NS/SB on tele. On RA saturating > 93%. On consistent carb diet, had 4  BMs overnight, Z guard and anti fungal creams applied after each cleaning. CHG bath performed, Linens, gown and pads changed. PRN hydralazine given for elevated BP. Pt currently resting without complaints of pain or discomfort. Bed lowest position, call bell within reach, report given to dayshift RN.  Problem: Moderate/High Fall Risk Score >5  Goal: Patient will remain free of falls  Outcome: Progressing  Flowsheets (Taken 01/04/2020 2030)  High (Greater than 13):   HIGH-Consider use of low bed   HIGH-Initiate use of floor mats as appropriate   HIGH-Pharmacy to initiate evaluation and intervention per protocol   HIGH-Apply yellow "Fall Risk" arm band   HIGH-Utilize chair pad alarm for patient while in the chair   HIGH-Bed alarm on at all times while patient in bed   HIGH-Visual cue at entrance to patient's room     Problem: Safety  Goal: Patient will be free from injury during hospitalization  Outcome: Progressing  Flowsheets (Taken 01/03/2020 0216)  Patient will be free from injury during hospitalization:   Assess patient's risk for falls and implement fall prevention plan of care per policy   Provide and maintain safe environment   Use appropriate transfer methods   Ensure appropriate safety devices are available at the bedside   Hourly rounding  Goal: Patient will be free from infection during hospitalization  Outcome: Progressing  Flowsheets (Taken 01/03/2020 0216)  Free from Infection during hospitalization:   Assess and monitor for signs and symptoms of infection   Monitor lab/diagnostic results     Problem: Compromised Tissue integrity  Goal: Damaged tissue is healing and protected  Outcome: Progressing  Flowsheets (Taken 01/04/2020 2134)  Damaged tissue is healing and protected:   Monitor/assess Braden scale every shift   Reposition patient every 2 hours and as needed  unless able to reposition self   Increase activity as tolerated/progressive mobility   Keep intact skin clean and dry   Use incontinence wipes for cleaning urine, stool and caustic drainage. Foley care as needed  Goal: Nutritional status is improving  Outcome: Progressing  Flowsheets (Taken 01/03/2020 0216)  Nutritional status is improving:   Assist patient with eating   Allow adequate time for meals   Collaborate with Clinical Nutritionist   Include patient/patient care companion in decisions related to nutrition     Problem: Compromised Hemodynamic Status  Goal: Vital signs and fluid balance maintained/improved  Outcome: Progressing  Flowsheets (Taken 01/03/2020 0216)  Vital signs and fluid balance are maintained/improved:   Position patient for maximum circulation/cardiac output   Monitor/assess vitals and hemodynamic parameters with position changes   Monitor/assess lab values and report abnormal values   Monitor and compare daily weight   Monitor intake and output. Notify LIP if urine output is less than 30 mL/hour.     Problem: Ineffective Airway Clearance  Goal: Airway is maintained  Outcome: Progressing  Flowsheets (Taken 01/03/2020 0216)  Airway is maintained:   Teach/Reinforce use of incentive spirometer 10 times per hour while awake, cough and deep breath as needed   Maintain oxygen level of greater than 92% or as ordered by LIP (reworded)   Reposition patient every 2 hours and as needed unless able to self-reposition     Problem: Inadequate Gas Exchange  Goal: Adequate oxygenation and improved ventilation  Outcome: Progressing  Flowsheets (Taken 01/03/2020 2202)  Adequate oxygenation and  improved ventilation:   Assess lung sounds   Consult/collaborate with Respiratory Therapy   Teach/reinforce use of incentive spirometer 10 times per hour while awake, cough and deep breath as needed  Goal: Patent Airway maintained  Outcome: Progressing  Flowsheets (Taken 01/03/2020 0216)  Patent airway  maintained:   Position patient for maximum ventilatory efficiency   Reposition patient every 2 hours and as needed unless able to self-reposition     Problem: Renal Instability  Goal: Fluid and electrolyte balance are achieved/maintained  Outcome: Progressing  Flowsheets (Taken 01/04/2020 2134)  Fluid and electrolyte balance are achieved/maintained:   Monitor intake and output every shift   Monitor daily weight   Provide adequate hydration   Monitor for muscle weakness   Follow fluid restrictions/IV/PO parameters  Goal: Free from infection  Outcome: Progressing  Flowsheets (Taken 01/03/2020 0216)  Free from infection: Monitor/assess for signs and symptoms of infection     Problem: Patient Receiving Advanced Renal Therapies  Goal: Therapy access site remains intact  Outcome: Progressing  Flowsheets (Taken 01/03/2020 0216)  Therapy access site remains intact:   Assess therapy access site   Change therapy access site dressing as needed

## 2020-01-04 NOTE — Progress Notes (Signed)
[facility] PROGRESS NOTE  Aaron Edelman, x [phone] ([place])      Date Time: 01/04/20 12:12 PM  Patient Name: [name]  Attending Physician: Murlean Hark, MD    CC: follow-up esrd    Assessment:   1. ESRD on HD MWF at  [facility], followed by Dr. Randel Pigg dialysed yesterday   2. Mild hypocalcemia  3. Anemia of chronic disease  4. Hypoxemia, shortness of breath due to Covid 19 PNA  - not vaccinated - increased ) 2 requirement5. Watery diarrhea + cdiff  6. HTN, labile      Recommendations:   1 continue rx per primary peam   2  dialysis Monday       Case discussed with: dialysis rn   Jaylenn Baiza Charolotte Capuchin, MD  [facility]  [phone] (office)  X [phone] ([place])      Subjective: alert / drowsy     Review of Systems:   Review of Systems - + sob and diarrhoea       Physical Exam:     Vitals:    01/03/20 2300 01/04/20 0300 01/04/20 0616 01/04/20 1047   BP: 165/65 159/75 154/58 171/68   Pulse: 60 60 (!) 55 63   Resp: 18 20  20    Temp: 97.6 F (36.4 C) 97.4 F (36.3 C) 97.5 F (36.4 C) 98 F (36.7 C)   TempSrc: Oral Oral Oral Axillary   SpO2: 98% 97% 98% 98%   Weight:  98.3 kg (216 lb 11.2 oz)     Height:           Intake and Output Summary (Last 24 hours) at Date Time    Intake/Output Summary (Last 24 hours) at 01/04/2020 1212  Last data filed at 01/03/2020 1754  Gross per 24 hour   Intake 400 ml   Output    Net 400 ml       General: awake, alert, oriented x 3, no acute distress.  Cardiovascular: regular rate and rhythm, no murmurs, rubs or gallops  Lungs: clear to auscultation bilaterally, without wheezing, rhonchi, or rales  Abdomen: soft, non-tender, non-distended, normoactive bowel sounds  Extremities: no clubbing, cyanosis, or edema      Meds:      Scheduled Meds: PRN Meds:    atorvastatin, 40 mg, Oral, Daily  cholestyramine, 2 packet, Oral, BID  dexAMETHasone, 6 mg, Intravenous, Daily  dexAMETHasone, 10 mg, Intravenous, Once  fludrocortisone, 0.1 mg, Oral,  Daily  heparin (porcine), 5,000 Units, Subcutaneous, Q12H SCH  insulin glargine, 20 Units, Subcutaneous, QHS  insulin lispro, 1-4 Units, Subcutaneous, QHS  insulin lispro, 1-8 Units, Subcutaneous, TID AC  miconazole 2 % with zinc oxide, , Topical, Q6H  midodrine, 10 mg, Oral, TID MEALS  pantoprazole, 40 mg, Oral, QAM AC  senna, 8.6 mg, Oral, QHS  sevelamer, 800 mg, Oral, TID MEALS  vancomycin, 125 mg, Oral, 4 times per day  vitamin C, 500 mg, Oral, Daily          Continuous Infusions:   acetaminophen, 650 mg, Q6H PRN   Or  acetaminophen, 650 mg, Q6H PRN  acetaminophen, 650 mg, Once PRN  acetaminophen, 650 mg, Q6H PRN  bisacodyl, 5 mg, Daily PRN  dextrose, 15 g of glucose, PRN   And  dextrose, 12.5 g, PRN   And  glucagon (rDNA), 1 mg, PRN  guaiFENesin-codeine, 5 mL, Q4H PRN  hydrALAZINE, 10 mg, Q6H PRN  melatonin, 3 mg, QHS PRN  naloxone, 0.2 mg, PRN  ondansetron,  4 mg, Q6H PRN              Labs:     Recent Labs   Lab 01/04/20  0353 01/03/20  0430 01/02/20  0411   WBC 4.22 4.11 3.24   Hgb 8.5* 9.0* 8.2*   Hematocrit 29.8* 31.0* 28.0*   Platelets 257 238 190     Recent Labs   Lab 01/04/20  0353 01/03/20  0430 01/02/20  0411 01/01/20  0051 12/31/19  2012   Sodium 138 138 140  More results in Results Review 136   Potassium 5.1 4.4 4.1  More results in Results Review 3.7   Chloride 100 99* 99*  More results in Results Review 92*   CO2 20* 23 27  More results in Results Review 25   BUN 52.0* 36.0* 55.0*  More results in Results Review 33.0*   Creatinine 7.6* 5.8* 7.7*  More results in Results Review 5.4*   Calcium 8.6 8.6 7.7*  More results in Results Review 7.9*   Albumin  --   --   --   --  2.7*   Phosphorus  --   --  5.5*  --   --    Glucose 245* 140* 208*  More results in Results Review 265*   EGFR 6.5 8.9 6.4  More results in Results Review 9.6   More results in Results Review = values in this interval not displayed.             Invalid input(s): LEUKOCYTESUR        Imaging personally reviewed, including: No results  found.        Signed by: Laural Golden, MD

## 2020-01-05 LAB — IRON PROFILE
Iron Saturation: 27 % (ref 15–50)
Iron: 50 ug/dL (ref 40–145)
TIBC: 188 ug/dL — ABNORMAL LOW (ref 265–497)
UIBC: 138 ug/dL (ref 126–382)

## 2020-01-05 LAB — CBC AND DIFFERENTIAL
Absolute NRBC: 0 10*3/uL (ref 0.00–0.00)
Hematocrit: 28.2 % — ABNORMAL LOW (ref 34.7–43.7)
Hgb: 8.3 g/dL — ABNORMAL LOW (ref 11.4–14.8)
MCH: 28.3 pg (ref 25.1–33.5)
MCHC: 29.4 g/dL — ABNORMAL LOW (ref 31.5–35.8)
MCV: 96.2 fL — ABNORMAL HIGH (ref 78.0–96.0)
MPV: 11.1 fL (ref 8.9–12.5)
Nucleated RBC: 0 /100 WBC (ref 0.0–0.0)
Platelets: 239 10*3/uL (ref 142–346)
RBC: 2.93 10*6/uL — ABNORMAL LOW (ref 3.90–5.10)
RDW: 14 % (ref 11–15)
WBC: 8.26 10*3/uL (ref 3.10–9.50)

## 2020-01-05 LAB — MAN DIFF ONLY
Atypical Lymphocytes %: 1 %
Atypical Lymphocytes Absolute: 0.08 10*3/uL — ABNORMAL HIGH (ref 0.00–0.00)
Band Neutrophils Absolute: 0 10*3/uL (ref 0.00–1.00)
Band Neutrophils: 0 %
Basophils Absolute Manual: 0 10*3/uL (ref 0.00–0.08)
Basophils Manual: 0 %
Eosinophils Absolute Manual: 0 10*3/uL (ref 0.00–0.44)
Eosinophils Manual: 0 %
Lymphocytes Absolute Manual: 1.07 10*3/uL (ref 0.42–3.22)
Lymphocytes Manual: 13 %
Monocytes Absolute: 0.58 10*3/uL (ref 0.21–0.85)
Monocytes Manual: 7 %
Neutrophils Absolute Manual: 6.53 10*3/uL — ABNORMAL HIGH (ref 1.10–6.33)
Segmented Neutrophils: 79 %

## 2020-01-05 LAB — BASIC METABOLIC PANEL
Anion Gap: 15 (ref 5.0–15.0)
BUN: 61 mg/dL — ABNORMAL HIGH (ref 7.0–19.0)
CO2: 21 mEq/L — ABNORMAL LOW (ref 22–29)
Calcium: 8.4 mg/dL — ABNORMAL LOW (ref 8.5–10.5)
Chloride: 100 mEq/L (ref 100–111)
Creatinine: 9.1 mg/dL — ABNORMAL HIGH (ref 0.6–1.0)
Glucose: 189 mg/dL — ABNORMAL HIGH (ref 70–100)
Potassium: 5.2 mEq/L — ABNORMAL HIGH (ref 3.5–5.1)
Sodium: 136 mEq/L (ref 136–145)

## 2020-01-05 LAB — GFR
EGFR: 5.3
EGFR: 4.9

## 2020-01-05 LAB — CBC
Absolute NRBC: 0 10*3/uL (ref 0.00–0.00)
Hematocrit: 29.4 % — ABNORMAL LOW (ref 34.7–43.7)
Hgb: 8.7 g/dL — ABNORMAL LOW (ref 11.4–14.8)
MCH: 28.7 pg (ref 25.1–33.5)
MCHC: 29.6 g/dL — ABNORMAL LOW (ref 31.5–35.8)
MCV: 97 fL — ABNORMAL HIGH (ref 78.0–96.0)
MPV: 10.8 fL (ref 8.9–12.5)
Nucleated RBC: 0 /100 WBC (ref 0.0–0.0)
Platelets: 267 10*3/uL (ref 142–346)
RBC: 3.03 10*6/uL — ABNORMAL LOW (ref 3.90–5.10)
RDW: 14 % (ref 11–15)
WBC: 5.49 10*3/uL (ref 3.10–9.50)

## 2020-01-05 LAB — GLUCOSE WHOLE BLOOD - POCT
Whole Blood Glucose POCT: 188 mg/dL — ABNORMAL HIGH (ref 70–100)
Whole Blood Glucose POCT: 276 mg/dL — ABNORMAL HIGH (ref 70–100)
Whole Blood Glucose POCT: 188 mg/dL — ABNORMAL HIGH (ref 70–100)
Whole Blood Glucose POCT: 290 mg/dL — ABNORMAL HIGH (ref 70–100)

## 2020-01-05 LAB — COMPREHENSIVE METABOLIC PANEL
ALT: 8 U/L (ref 0–55)
AST (SGOT): 9 U/L (ref 5–34)
Albumin/Globulin Ratio: 0.7 — ABNORMAL LOW (ref 0.9–2.2)
Albumin: 2.7 g/dL — ABNORMAL LOW (ref 3.5–5.0)
Alkaline Phosphatase: 76 U/L (ref 37–106)
Anion Gap: 15 (ref 5.0–15.0)
BUN: 71 mg/dL — ABNORMAL HIGH (ref 7.0–19.0)
Bilirubin, Total: 0.2 mg/dL (ref 0.2–1.2)
CO2: 20 mEq/L — ABNORMAL LOW (ref 22–29)
Calcium: 8.4 mg/dL — ABNORMAL LOW (ref 8.5–10.5)
Chloride: 100 mEq/L (ref 100–111)
Creatinine: 9.7 mg/dL — ABNORMAL HIGH (ref 0.6–1.0)
Globulin: 4 g/dL — ABNORMAL HIGH (ref 2.0–3.6)
Glucose: 270 mg/dL — ABNORMAL HIGH (ref 70–100)
Potassium: 5.4 mEq/L — ABNORMAL HIGH (ref 3.5–5.1)
Protein, Total: 6.7 g/dL (ref 6.0–8.3)
Sodium: 135 mEq/L — ABNORMAL LOW (ref 136–145)

## 2020-01-05 LAB — CELL MORPHOLOGY
Cell Morphology: ABNORMAL — AB
Platelet Estimate: NORMAL

## 2020-01-05 LAB — HEMOLYSIS INDEX
Hemolysis Index: 5 (ref 0–18)
Hemolysis Index: 6 (ref 0–18)
Hemolysis Index: 6 (ref 0–24)

## 2020-01-05 LAB — VITAMIN B12: Vitamin B-12: 850 pg/mL (ref 211–911)

## 2020-01-05 LAB — FOLATE: Folate: 8.3 ng/mL

## 2020-01-05 MED ORDER — ALBUMIN HUMAN 25 % IV SOLN
100.0000 mL | INTRAVENOUS | Status: AC | PRN
Start: 2020-01-05 — End: 2020-01-05

## 2020-01-05 MED ORDER — SODIUM CHLORIDE 0.9 % IV BOLUS
100.0000 mL | INTRAVENOUS | Status: AC | PRN
Start: 2020-01-05 — End: 2020-01-05

## 2020-01-05 MED ORDER — SODIUM CHLORIDE 0.9 % IV BOLUS
250.0000 mL | INTRAVENOUS | Status: AC | PRN
Start: 2020-01-05 — End: 2020-01-05

## 2020-01-05 MED ORDER — LIDOCAINE HCL 2 % EX GEL (WRAP)
Freq: Four times a day (QID) | CUTANEOUS | Status: AC
Start: 2020-01-05 — End: 2020-01-07
  Filled 2020-01-05: qty 5
  Filled 2020-01-05: qty 30
  Filled 2020-01-05: qty 5

## 2020-01-05 MED ORDER — FIDAXOMICIN 200 MG PO TABS
200.0000 mg | ORAL_TABLET | Freq: Two times a day (BID) | ORAL | Status: DC
Start: 2020-01-05 — End: 2020-01-08
  Administered 2020-01-05 – 2020-01-08 (×6): 200 mg via ORAL
  Filled 2020-01-05 (×7): qty 1

## 2020-01-05 MED ORDER — LIDOCAINE HCL 2 % EX GEL (WRAP)
Freq: Four times a day (QID) | CUTANEOUS | Status: DC | PRN
Start: 2020-01-07 — End: 2020-01-08
  Filled 2020-01-05: qty 30

## 2020-01-05 NOTE — Progress Notes (Signed)
HOSPITALIST PROGRESS NOTE  [facility]      Patient: [name]  Date: 01/05/2020    LOS: 5 Days  Admission Date: 12/31/2019   MRN: [id number]  Attending: Murlean Hark, MD  , MPH                    Please contact via [place]: [phone]                    Pager: [phone]     ASSESSMENT/PLAN     BRINDA FOCHT is a 64 y.o. female admitted with Pneumonia due to COVID-19 virus    Interval Summary:     [facility] Problems    Diagnosis    Diarrhea    Pneumonia due to COVID-19 virus    History of CVA (cerebrovascular accident)    History of COPD    Hyperlipidemia    Hypertension    GERD (gastroesophageal reflux disease)    Type 2 diabetes mellitus, with long-term current use of insulin    h/o orthostatic hypotension    ESRD (end stage renal disease) on dialysis    Iron deficiency anemia secondary to inadequate dietary iron intake       Patient Active Hospital Problem List:   Pneumonia due to COVID-19 virus (12/31/2019)   History of COPD (06/23/2019)    Assessment: No history of chronic respiratory failure.  Patient has been weaned down to room air    Plan: Stop ceftriaxone  Continue with dexamethasone 6 mg IV daily  Recommend Robitussin with codeine for cough  Does not meet criteria for Remedesivir   Encouraged to use incentive spirometry  Continue airborne and contact isolation per Covid protocol    Diarrhea   Assessment-Confirmed to be C diff. patient continues to have several watery bowel movements through the course of the day and night. So far 12 BMs in past 24 hrs Shigella/Salmonella/Campylobacter tests are negative.  ID on board  Plan-Continue vancomycin p.o. for now (12/09-)   Discussed with Dr. Lahoma Crocker, infectious disease  Will consider adding flagyl if no improvement by tomorrow   Continue Questran 1 pack twice daily  Desitin for peri-anal irritation  Started lidocaine 2% jelly for perianal pain     Iron deficiency anemia secondary to inadequate  dietary iron intake (05/14/2019)    Assessment: Chronic and stable    Plan: IV iron versus Aranesp per nephrology     ESRD (end stage renal disease) on dialysis (05/24/2019)    Assessment: Dialysis days are Monday Wednesdays and Fridays; last session 12/10    Plan: Appreciate nephrology inputs     Type 2 diabetes mellitus, with long-term current use of insulin (05/31/2019)    Assessment: Chronic with poor control and A1c of 10.2    Plan: Sliding scale lispro before every meal and at bedtime  Continue Lantus 20 units nightly     GERD (gastroesophageal reflux disease) (05/31/2019)    Assessment: Chronic and appears to be well controlled    Plan: Continue with Protonix 40 mg daily     Hypertension (06/23/2019)    Assessment: Chronic with unclear list of medications at home    Plan: Home medications to be reconciled    Make midodrine 10 mg. Tid prn     Stopped fludrocortisone after discussions with renal      Hyperlipidemia (06/23/2019)  Assessment: Chronic and unknown control    Plan: Continue with home dose of Lipitor 40 mg daily     History of CVA (cerebrovascular accident) (07/04/2019)    Assessment: Chronic with no acute deficits    Plan: Continue with Lipitor 40 mg daily    Analgesia: Tylenol    Nutrition: Consistent carbohydrate diet    GI Prophylaxis: Protonix    DVT Prophylaxis: Heparin    Code Status: Full code    DISPO:     12/10: Discharge currently on hold on account of malaise, poor appetite, dyspnea and ongoing diarrhea    12/11: Anticipate 2 to 3 days for improvement of symptoms of C. difficile diarrhea with current regimen.  PT/OT orders placed    12/12: Continue inpatient status for multiple semi-formed BMs in the past 24 hours. Anticipate 2-3 days before discharge.    12/13:  Continues to have numerous soft/liquid BMs. May need additional/alternative therapy for C. difficile       SUBJECTIVE     SUNSHYNE HORVATH states that she is not doing well on account of ongoing diarrhea and perianal pain.  She admits  that her appetite may be getting better but is not back to baseline yet.  She complains that the food is not warm enough and she cannot eat cold food    Febrile- None   Eating-better with improving appetite  Coughing- None   Diarrhea- None   Sleeping - Well   Voiding - Well        MEDICATIONS     Current Facility-Administered Medications   Medication Dose Route Frequency    atorvastatin  40 mg Oral Daily    cholestyramine  2 packet Oral BID    darbepoetin alfa  60 mcg Subcutaneous Weekly    dexAMETHasone  6 mg Intravenous Daily    dexAMETHasone  10 mg Intravenous Once    fludrocortisone  0.1 mg Oral Daily    heparin (porcine)  5,000 Units Subcutaneous Q12H [place]    insulin glargine  20 Units Subcutaneous QHS    insulin lispro  1-4 Units Subcutaneous QHS    insulin lispro  1-8 Units Subcutaneous TID AC    miconazole 2 % with zinc oxide   Topical Q6H    pantoprazole  40 mg Oral QAM AC    sevelamer  800 mg Oral TID MEALS    vancomycin  125 mg Oral 4 times per day    vitamin C  500 mg Oral Daily       PHYSICAL EXAM     Vitals:    01/05/20 0752   BP: 146/67   Pulse:    Resp: 20   Temp: 97.7 F (36.5 C)   SpO2:        Temperature: Temp  Min: 97.6 F (36.4 C)  Max: 98 F (36.7 C)  Pulse: Pulse  Min: 60  Max: 65  Respiratory: Resp  Min: 18  Max: 20  Non-Invasive BP: BP  Min: 146/67  Max: 171/83  Pulse Oximetry SpO2  Min: 94 %  Max: 98 %    Intake and Output Summary (Last 24 hours) at Date Time  No intake or output data in the 24 hours ending 01/05/20 1009    Physical Exam  Constitutional:       General: She is not in acute distress.     Appearance: She is not diaphoretic.      Comments: Weak appearing.  Getting dialysis right now   HENT:  Head: Normocephalic and atraumatic.      Right Ear: External ear normal.      Left Ear: External ear normal.   Eyes:      Pupils: Pupils are equal, round, and reactive to light.   Neck:      Thyroid: No thyromegaly.      Vascular: No JVD.      Trachea: No tracheal  deviation.   Cardiovascular:      Rate and Rhythm: Normal rate and regular rhythm.      Heart sounds: Normal heart sounds. No murmur heard.  No friction rub. No gallop.    Pulmonary:      Effort: Pulmonary effort is normal. No respiratory distress.      Breath sounds: No stridor. No wheezing, rhonchi or rales.   Chest:      Chest wall: No tenderness.   Abdominal:      General: Bowel sounds are normal. There is no distension.      Palpations: Abdomen is soft. There is no mass.      Tenderness: There is no abdominal tenderness. There is no guarding or rebound.   Musculoskeletal:         General: No tenderness or deformity. Normal range of motion.      Cervical back: Normal range of motion and neck supple.   Lymphadenopathy:      Cervical: No cervical adenopathy.   Skin:     General: Skin is warm and dry.      Coloration: Skin is not pale.      Findings: No erythema or rash.   Neurological:      Mental Status: She is alert and oriented to person, place, and time.      Cranial Nerves: No cranial nerve deficit.      Motor: No abnormal muscle tone.      Coordination: Coordination normal.      Gait: Gait is intact.      Deep Tendon Reflexes: Reflexes are normal and symmetric.   Psychiatric:         Mood and Affect: Mood and affect normal.         Cognition and Memory: Memory normal.         Judgment: Judgment normal.       LABS     Recent Labs   Lab 01/05/20  0546 01/04/20  0353 01/03/20  0430   WBC 5.49 4.22 4.11   RBC 3.03* 3.02* 3.16*   Hgb 8.7* 8.5* 9.0*   Hematocrit 29.4* 29.8* 31.0*   MCV 97.0* 98.7* 98.1*   Platelets 267 257 238       Recent Labs   Lab 01/05/20  0546 01/04/20  0353 01/03/20  0430 01/02/20  0411 01/01/20  0051   Sodium 136 138 138 140 138   Potassium 5.2* 5.1 4.4 4.1 3.7   Chloride 100 100 99* 99* 94*   CO2 21* 20* 23 27 25    BUN 61.0* 52.0* 36.0* 55.0* 38.0*   Creatinine 9.1* 7.6* 5.8* 7.7* 5.9*   Glucose 189* 245* 140* 208* 244*   Calcium 8.4* 8.6 8.6 7.7* 7.9*       Recent Labs   Lab  12/31/19  2012   ALT 10   AST (SGOT) 20   Bilirubin, Total 0.4   Albumin 2.7*   Alkaline Phosphatase 121*       Recent Labs   Lab 01/01/20  0526 01/01/20  0051 12/31/19  2012   Troponin I  0.05 0.05 0.06*             Microbiology Results (last 15 days)     Procedure Component Value Units Date/Time    Clostridium difficile toxin B PCR [[id number]]  (Abnormal) Collected: 01/01/20 2336    Order Status: Completed Specimen: Stool Updated: 01/03/20 0130     Stool Clostridium difficile Toxin B PCR Positive     Comment: Test performed using a BDMax PCR Assay. Due to the high  sensitivity of the assay, repeat testing is not recommended  and will be cancelled following [place] policy. Molecular  detection of C. difficile is not recommended as a test of cure  or for surveillance purposes. A positive test may indicate  infection or colonization with C. difficile. Testing will not  be performed on formed stool.         Narrative:      Patient's current admission began:->less than or equal to 3  days ago  Cancel C-Diff order if no diarrhea in 12 hours?->Yes    cdiff ok to process per dr Manson Allan    Stool Cryptosporidium / Giardia Antigen [[id number]] Collected: 01/01/20 2336    Order Status: Completed Specimen: Stool Updated: 01/02/20 0900    Narrative:      ORDER#: [id number]                                    ORDERED BY: Murlean Hark  SOURCE: Stool                                        COLLECTED:  01/01/20 23:36  ANTIBIOTICS AT COLL.:                                RECEIVED :  01/02/20 05:24  Cryptosporidium/Giardia Antigen            FINAL       01/02/20 09:00  01/02/20   Negative for Cryptosporidium parvum antigen             Negative for Giardia lamblia antigen      Stool for Salm/Shig/Campy/Shiga PCR [[id number]] Collected: 01/01/20 2336    Order Status: Completed Specimen: Stool Updated: 01/02/20 1627     Stool Salmonella Species by PCR Negative     Stool Shigella Species/Enteroinvasive Escherichia coli PCR Negative     Stool  Campylobacter jejunii/coli by PCR Negative     Stool Shiga Toxin by PCR Negative     Comment: Testing performed using a BDMax multiplex PCR panel which  detects bacterial DNA. A positive result does not necessarily  indicate the presence of viable organisms. Positive results  do not rule out co-infection with other organisms that are  not detected by this test. This panel does not differentiate  between Shigella spp. and Enteroinvasive Escherichia coli  (EIEC), which are closely related and may cause similar  illness.  A result of Shiga Toxin POSITIVE, Shigella/EIEC NEGATIVE is  most likely due to the presence of Shiga toxin-producing  E. coli (STEC). Shigella species isolated in the [place]  are rarely positive for Shiga Toxin.  Serotyping of Salmonella, Shigella, or Shiga Toxin DNA  positive samples will be performed by [facility] for public health  surveillance purposes.  Serotyping results are available  upon request.         COVID-19 (SARS-CoV-2) and Influenza A/B, NAA (Liat Rapid) [[id number]]  (Abnormal) Collected: 12/31/19 2054    Order Status: Completed Specimen: Culturette from Nasopharyngeal Updated: 12/31/19 2213     Purpose of COVID testing Diagnostic -[facility]     SARS-CoV-2 Specimen Source Nasopharyngeal     SARS CoV 2 Overall Result Detected     Comment: Positive COVID results called to Dr.He.  Readback confirmed by [id number] at 22:13  12/31/2019  Test performed using the Roche cobas Liat SARS-CoV-2 assay. This assay is  only for use under the Food and Drug Administrations Emergency Use  Authorization. This is a real-time RT-PCR assay for the qualitative  detection of SARS-CoV-2 RNA. Viral nucleic acids may persist in vivo,  independent of viability. Detection of viral nucleic acid does not imply the  presence of infectious virus, or that virus nucleic acid is the cause of  clinical symptoms. Negative results do not preclude SARS-CoV-2 infection and  should not be used as the sole basis for diagnosis,  treatment or other  patient management decisions. Negative results must be combined with  clinical observations, patient history, and/or epidemiological information.  Invalid results may be due to inhibiting substances in the specimen and  recollection should occur. Please see Fact Sheets for patients and providers  located:  https://www.benson-chung.com/.          Influenza A Not Detected     Comment: Influenza A  NOT Detected results should be considered presumptive in  samples that have a positive SARS-CoV-2 (COVID-19) result. If co-infection  with influenza A virus is suspected and influenza virus detection would  change clinical management, please order influenza specific testing.          Influenza B Not Detected     Comment: Influenza B NOT Detected results should be considered presumptive in samples  that have a positive SARS-CoV-2 (COVID-19) result. If co-infection with  influenza B virus is suspected and influenza virus detection would change  clinical management, please order influenza specific testing.  Test performed using the Roche cobas Liat SARS-CoV-2 & Influenza A/B assay.  This assay is only for use under the Food and Drug Administrations  Emergency Use Authorization. This is a multiplex real-time RT-PCR assay  intended for the simultaneous in vitro qualitative detection and  differentiation of SARS-CoV-2, influenza A, and influenza B virus RNA. Viral  nucleic acids may persist in vivo, independent of viability. Detection of  viral nucleic acid does not imply the presence of infectious virus, or that  virus nucleic acid is the cause of clinical symptoms. Negative results do  not preclude SARS-CoV-2, influenza A, and/or influenza B infection and  should not be used as the sole basis for diagnosis, treatment or other  patient management decisions. Negative results must be combined with  clinical observations, patient history, and/or epidemiological information.  Invalid results may  be due to inhibiting substances in the specimen and  recollection should occur. Please see Fact Sheets for patients and providers  located: http://olson-hall.info/.         Narrative:      o Collect and clearly label specimen type:  o PREFERRED-Upper respiratory specimen: One Nasopharyngeal  Swab in Transport Media.  o Hand deliver to laboratory ASAP  Diagnostic -[facility]    Blood Culture #1 [[id number]] Collected: 12/31/19 2012    Order Status: Completed Specimen: Arm from Blood, Venipuncture Updated: 01/05/20 0321    Narrative:  ORDER#: [id number]                                    ORDERED BY: HE, ALBERT  SOURCE: Blood, Venipuncture Arm                      COLLECTED:  12/31/19 20:12  ANTIBIOTICS AT COLL.:                                RECEIVED :  01/01/20 02:23  Culture Blood Aerobic and Anaerobic        PRELIM      01/05/20 03:21  01/02/20   No Growth after 1 day/s of incubation.  01/03/20   No Growth after 2 day/s of incubation.  01/04/20   No Growth after 3 day/s of incubation.  01/05/20   No Growth after 4 day/s of incubation.      Blood Culture #2 [[id number]] Collected: 12/31/19 2012    Order Status: Completed Specimen: Arm from Blood, Venipuncture Updated: 01/05/20 0321    Narrative:      ORDER#: [id number]                                    ORDERED BY: HE, ALBERT  SOURCE: Blood, Venipuncture Arm                      COLLECTED:  12/31/19 20:12  ANTIBIOTICS AT COLL.:                                RECEIVED :  01/01/20 02:23  Culture Blood Aerobic and Anaerobic        PRELIM      01/05/20 03:21  01/02/20   No Growth after 1 day/s of incubation.  01/03/20   No Growth after 2 day/s of incubation.  01/04/20   No Growth after 3 day/s of incubation.  01/05/20   No Growth after 4 day/s of incubation.             Chest AP Portable    Result Date: 12/31/2019  Bilateral hazy and consolidation throughout the peripheries of the midlungs and lung bases which represent atypical/viral pneumonia, or asymmetric  pulmonary edema. Raphael Gibney, DO  12/31/2019 8:02 PM      Echo Results     None          I have personally reviewed the patient's labs, medications, and imaging    Total visit time = 45 mins ; more than 50% spent counseling/coordinating care    Signed,    Murlean Hark MD, MPH  10:09 AM 01/05/2020   Please contact via [place]: [phone]  Pager: [phone]       This chart was generated using a medical voice-recognition software which does not employ spell-checking or grammar-checking features. It was dictated, all or in part, in a busy and often noisy patient care environment. I have taken all usual measures to dictate carefully and to review all aspects of this chart. Nonetheless, given the known and well-documented performance characteristics of voice recognition software in such patient care environments, this dictation still may contain unrecognized and wholly unintended errors or omissions.

## 2020-01-05 NOTE — Progress Notes (Signed)
Nutritional Support Services  Nutrition Follow-up    [name] 64 y.o. female   MRN: [id number]    Summary of Nutrition Recommendations:    1. Continue consistent carbohydrate diet as tolerated    2. Continue Glucerna po TID to optimize nutrition   Each Glucerna shake provides 220 kcal and 10 gm protein.     3. Trial 1 Banatrol po daily at breakfast d/t loose stools    -----------------------------------------------------------------------------------------------------------------    D/w RN                                                       ASSESSMENT DATA     COVID 19/ PUI Isolation Precautions noted.  Personal Protective Equipment (PPE)donned: procedure gown, reusable respirator with procedural mask, face shield, gloves    Subjective Nutrition: Met pt at bedside. Pt states that she is hungry, however she is c/o of the food being cold. She states she cannot eat cold food. Pt also w/ 5 BMs today, says that "my behind is burning". Endorses drinking 1 Glucerna per day; RD encouraged pt to drink 2 per day. RD also encouraged pt receive food from family; suggested them to bring hot food in an insulated bag so that pt can have warm food that she likes. Pt agreeable and states family knows consistent carbohydrate diet.     Spoke w/ RN [name] - pt is very picky about her foods, however eats fruit. RN spoke w/ MD about diet liberalization, however MD hesitant to change diet at this point. Note that pt w/ hyperglycemia. Also informed RN of plan for family to bring food to pt; agreeable.     Spoke w/ pt's daughter, [name], about bringing food from home. She said her brother, who lives in [place], [place] be able to come by this week. Discussed consistent carbohydrate diet and appropriate carbohydrate serving sizes w/ daughter - voiced understanding.     Learning Needs: Discussed foods that pt can eat to firm up stool such as applesauce, toast, rice, and bananas. Pt voiced understanding and was able to list foods at  end of visit. Also offered Banatrol; pt agreeable to trying at breakfast.     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 COVID-19 and C diff. Infection.     Medical Hx:  has a past medical history of Chronic obstructive pulmonary disease, Diabetes mellitus, Hyperlipidemia, Hypertension, and Sleep apnea (2018).       Orders Placed This Encounter   Procedures   . Diet consistent carbohydrate   . Glucerna Shake (8 Oz Cans) Quantity: A. One; Flavor: Vanilla; Frequency: Daily with dinner     Intake: ~25% average of meals   12/12 - 25% x2; Glucerna 50% x1, 75% x1  12/11 - 25% x2, 100% x1   12/10 - 50% x1  12/9 - no documentation     Physical Assessment: see 12/9 note  Edema: generalized, +1 to RUE and LUE per flowsheets 12/13  Skin: MASD to perineum per flowsheets   GI function: diarrhea, pt given antidiarrheal per flowsheets 12/13    ESTIMATED NEEDS    Total Daily Energy Needs: 1574.2 to 2037.2 kcal  Method for Calculating Energy Needs: 17 kcal - 22 kcal per kg  at 92.6 kg (  0 body weight)  Rationale: obese, non ICU, ESRD on HD       Total Daily Protein Needs: 75.15 to 100.2 g  Method for Calculating Protein Needs: 1.5 g - 2 g per kg at 50.1 kg (Ideal body weight)  Rationale: obese, non ICU, ESRD on HD      Total Daily Fluid Needs: 1574.2 to 2037.2 ml  Method for Calculating Fluid Needs: 1 ml per kcal energy = 1574.2 to 2037.2 kcal  Rationale: BMI, age, renal OR per MD adjustment    Pertinent Medications: lantus, humalog, vancomycin, vitamin c  IVF:      Pertinent labs: glucose, K, BUN, creatinine high  Lab 01/05/20  0546 01/04/20  0353 01/03/20  0430 01/02/20  0411 01/01/20  0526 01/01/20  0051   Sodium 136 138 138 140  --  138   Potassium 5.2* 5.1 4.4 4.1  --  3.7   BUN 61.0* 52.0* 36.0* 55.0*  --  38.0*   Creatinine 9.1* 7.6* 5.8* 7.7*  --  5.9*   Glucose 189* 245* 140* 208*  --  244*   Phosphorus  --   --   --  5.5*  --   --    EGFR 5.3 6.5 8.9  6.4  --  8.7   Hemoglobin A1C  --   --   --   --  10.2*  --                                                                 INTERVENTION     Nutrition recommendation - Please refer to top of note                                                       MONITORING/EVALUATION     Goals:     1. Pt will consume >75% of estimated needs via meals and ONS at f/u - active    Nutrition Risk Level: High (will follow up at least 2 times per week and PRN)      Ventura Sellers, RD, MPH  Clinical Dietitian   Ext [phone]

## 2020-01-05 NOTE — Plan of Care (Signed)
Problem: Renal Instability  Goal: Fluid and electrolyte balance are achieved/maintained  Outcome: Progressing  Flowsheets (Taken 01/05/2020 0802 by Ofilia Neas, RN)  Fluid and electrolyte balance are achieved/maintained:   Monitor intake and output every shift   Provide adequate hydration   Assess and reassess fluid and electrolyte status   Assess for confusion/personality changes  Goal: Free from infection  Outcome: Progressing  Flowsheets (Taken 01/03/2020 0216 by Roney Marion, RN)  Free from infection: Monitor/assess for signs and symptoms of infection     Problem: Patient Receiving Advanced Renal Therapies  Goal: Therapy access site remains intact  Outcome: Progressing  Flowsheets (Taken 01/03/2020 0216 by Roney Marion, RN)  Therapy access site remains intact:   Assess therapy access site   Change therapy access site dressing as needed

## 2020-01-05 NOTE — Progress Notes (Signed)
Report received from the primary nurse.  Arrived to Pt's room with Covid PPE on. Time out and safety checks done.Pt AOX4 complain of buttocks pain, primary nurse aware and addressed earlier. VSS.  L AVG WDL, 15 gauge needles used to cannulate. HD initiated using 2 K 2.5 ca bath.  HD nurse will monitor closely.       01/05/20 1435   Graft/Fistula Left   No placement date or time found.   Access Type: Arteriovenous Fistula  Orientation: Left  Graft/Fistula Location: Upper Arm   Fistula/ Graft Assessment Abnormalities WDL   Needle Size 15 Gauge   Vitals   Temp 98 F (36.7 C)   Heart Rate 72   Resp Rate 22   BP 160/64   SpO2 96 %   O2 Device None (Room air)   Machine Metrics   $Treatment Started/Capturing Charge Yes   Blood Flow Rate (mL/min) 150 mL/min   Arterial Pressure (mmHg) -30 mmHg   Venous Pressure (mmHg) 40   Dialysate Flow Rate (mL/min) 700 mL/min   Transmembrane Pressure (mmHg) 30 mmHg   Ultrafiltration Rate (mL/Hr) 1000 mL/hr   Fluid Removal (ml) 0 ml   Fluid Bolus (ml) 200 ml   Dialysate K (mEq/L) 2 mEq/L   Dialysate CA (mEq/L) 2.5 mEq/L   Hemodialysis Comments   Arteriovenous Lines Secure Yes   Comments HD initiated L AVG

## 2020-01-05 NOTE — Progress Notes (Signed)
LOS # 5      Summary of Discharge Plan:  Home with hh vs pt/ot recommendation?     Identified Possible Discharge Barriers:  12/13 covid +, pna, cdiff, esrd with [facility] dialysis, vanc oral, Hyperkalemia dialysis today, iv steroid,     CM Interventions and Outcome:  Cm following    Discussed above Discharge Plan with (patient, family, Care Team, others):  Care team     Case Management will continue to following on patient's discharge needs.      Mitzi Hansen, MHA, [place], CM  Case Manager [facility]

## 2020-01-05 NOTE — Progress Notes (Signed)
INFECTIOUS DISEASES PROGRESS NOTE    Date Time: 01/05/20 6:34 PM  Patient Name: [name],[name]  Patient status.Inpatient  Hospital Day: 5    Assessment and Plan:   1.  Covid  2.  C. difficile      Plan: 1.change to fidoxamicin  Subjective:   Comfortable on room air    Review of Systems:   Review of Systems - 12 bowel movements so far today    Antibiotics:   As above    Other medications reviewed in EPIC.  Central Access:       Physical Exam:     Vitals:    01/05/20 1745   BP: 162/70   Pulse: 74   Resp: 22   Temp: 97.7 F (36.5 C)   SpO2: 96%       Abdomen: No distention    Labs:     Microbiology Results (last 15 days)     Procedure Component Value Units Date/Time    Clostridium difficile toxin B PCR [[id number]]  (Abnormal) Collected: 01/01/20 2336    Order Status: Completed Specimen: Stool Updated: 01/03/20 0130     Stool Clostridium difficile Toxin B PCR Positive     Comment: Test performed using a BDMax PCR Assay. Due to the high  sensitivity of the assay, repeat testing is not recommended  and will be cancelled following [place] policy. Molecular  detection of C. difficile is not recommended as a test of cure  or for surveillance purposes. A positive test may indicate  infection or colonization with C. difficile. Testing will not  be performed on formed stool.         Narrative:      Patient's current admission began:->less than or equal to 3  days ago  Cancel C-Diff order if no diarrhea in 12 hours?->Yes    cdiff ok to process per dr Manson Allan    Stool Cryptosporidium / Giardia Antigen [[id number]] Collected: 01/01/20 2336    Order Status: Completed Specimen: Stool Updated: 01/02/20 0900    Narrative:      ORDER#: [id number]                                    ORDERED BY: Murlean Hark  SOURCE: Stool                                        COLLECTED:  01/01/20 23:36  ANTIBIOTICS AT COLL.:                                RECEIVED :  01/02/20 05:24  Cryptosporidium/Giardia Antigen            FINAL       01/02/20  09:00  01/02/20   Negative for Cryptosporidium parvum antigen             Negative for Giardia lamblia antigen      Stool for Salm/Shig/Campy/Shiga PCR [[id number]] Collected: 01/01/20 2336    Order Status: Completed Specimen: Stool Updated: 01/02/20 1627     Stool Salmonella Species by PCR Negative     Stool Shigella Species/Enteroinvasive Escherichia coli PCR Negative     Stool Campylobacter jejunii/coli by PCR Negative     Stool Shiga Toxin by PCR Negative  Comment: Testing performed using a BDMax multiplex PCR panel which  detects bacterial DNA. A positive result does not necessarily  indicate the presence of viable organisms. Positive results  do not rule out co-infection with other organisms that are  not detected by this test. This panel does not differentiate  between Shigella spp. and Enteroinvasive Escherichia coli  (EIEC), which are closely related and may cause similar  illness.  A result of Shiga Toxin POSITIVE, Shigella/EIEC NEGATIVE is  most likely due to the presence of Shiga toxin-producing  E. coli (STEC). Shigella species isolated in the [place]  are rarely positive for Shiga Toxin.  Serotyping of Salmonella, Shigella, or Shiga Toxin DNA  positive samples will be performed by [facility] for public health  surveillance purposes.  Serotyping results are available upon request.         COVID-19 (SARS-CoV-2) and Influenza A/B, NAA (Liat Rapid) [[id number]]  (Abnormal) Collected: 12/31/19 2054    Order Status: Completed Specimen: Culturette from Nasopharyngeal Updated: 12/31/19 2213     Purpose of COVID testing Diagnostic -[facility]I     SARS-CoV-2 Specimen Source Nasopharyngeal     SARS CoV 2 Overall Result Detected     Comment: Positive COVID results called to Dr.He.  Readback confirmed by [id number] at 22:13  12/31/2019  Test performed using the Roche cobas Liat SARS-CoV-2 assay. This assay is  only for use under the Food and Drug Administrations Emergency Use  Authorization. This is a real-time RT-PCR assay  for the qualitative  detection of SARS-CoV-2 RNA. Viral nucleic acids may persist in vivo,  independent of viability. Detection of viral nucleic acid does not imply the  presence of infectious virus, or that virus nucleic acid is the cause of  clinical symptoms. Negative results do not preclude SARS-CoV-2 infection and  should not be used as the sole basis for diagnosis, treatment or other  patient management decisions. Negative results must be combined with  clinical observations, patient history, and/or epidemiological information.  Invalid results may be due to inhibiting substances in the specimen and  recollection should occur. Please see Fact Sheets for patients and providers  located:  https://www.benson-chung.com/.          Influenza A Not Detected     Comment: Influenza A  NOT Detected results should be considered presumptive in  samples that have a positive SARS-CoV-2 (COVID-19) result. If co-infection  with influenza A virus is suspected and influenza virus detection would  change clinical management, please order influenza specific testing.          Influenza B Not Detected     Comment: Influenza B NOT Detected results should be considered presumptive in samples  that have a positive SARS-CoV-2 (COVID-19) result. If co-infection with  influenza B virus is suspected and influenza virus detection would change  clinical management, please order influenza specific testing.  Test performed using the Roche cobas Liat SARS-CoV-2 & Influenza A/B assay.  This assay is only for use under the Food and Drug Administrations  Emergency Use Authorization. This is a multiplex real-time RT-PCR assay  intended for the simultaneous in vitro qualitative detection and  differentiation of SARS-CoV-2, influenza A, and influenza B virus RNA. Viral  nucleic acids may persist in vivo, independent of viability. Detection of  viral nucleic acid does not imply the presence of infectious virus, or that  virus  nucleic acid is the cause of clinical symptoms. Negative results do  not preclude SARS-CoV-2, influenza A, and/or influenza B infection  and  should not be used as the sole basis for diagnosis, treatment or other  patient management decisions. Negative results must be combined with  clinical observations, patient history, and/or epidemiological information.  Invalid results may be due to inhibiting substances in the specimen and  recollection should occur. Please see Fact Sheets for patients and providers  located: http://olson-hall.info/.         Narrative:      o Collect and clearly label specimen type:  o PREFERRED-Upper respiratory specimen: One Nasopharyngeal  Swab in Transport Media.  o Hand deliver to laboratory ASAP  Diagnostic -PUI    Blood Culture #1 [[id number]] Collected: 12/31/19 2012    Order Status: Completed Specimen: Arm from Blood, Venipuncture Updated: 01/05/20 0321    Narrative:      ORDER#: [id number]                                    ORDERED BY: HE, ALBERT  SOURCE: Blood, Venipuncture Arm                      COLLECTED:  12/31/19 20:12  ANTIBIOTICS AT COLL.:                                RECEIVED :  01/01/20 02:23  Culture Blood Aerobic and Anaerobic        PRELIM      01/05/20 03:21  01/02/20   No Growth after 1 day/s of incubation.  01/03/20   No Growth after 2 day/s of incubation.  01/04/20   No Growth after 3 day/s of incubation.  01/05/20   No Growth after 4 day/s of incubation.      Blood Culture #2 [[id number]] Collected: 12/31/19 2012    Order Status: Completed Specimen: Arm from Blood, Venipuncture Updated: 01/05/20 0321    Narrative:      ORDER#: [id number]                                    ORDERED BY: HE, ALBERT  SOURCE: Blood, Venipuncture Arm                      COLLECTED:  12/31/19 20:12  ANTIBIOTICS AT COLL.:                                RECEIVED :  01/01/20 02:23  Culture Blood Aerobic and Anaerobic        PRELIM      01/05/20 03:21  01/02/20   No Growth after 1  day/s of incubation.  01/03/20   No Growth after 2 day/s of incubation.  01/04/20   No Growth after 3 day/s of incubation.  01/05/20   No Growth after 4 day/s of incubation.            CBC w/Diff CMP   Recent Labs   Lab 01/05/20  1349 01/05/20  0546 01/04/20  0353 01/01/20  0526 12/31/19  2012   WBC 8.26 5.49 4.22  More results in Results Review 5.41   Hgb 8.3* 8.7* 8.5*  More results in Results Review 9.0*   Hematocrit 28.2* 29.4* 29.8*  More results in Results Review 29.9*  Platelets 239 267 257  More results in Results Review 174   MCV 96.2* 97.0* 98.7*  More results in Results Review 95.5   Neutrophils  --   --   --   --  78.5   Segmented Neutrophils 79  --   --   --   --    More results in Results Review = values in this interval not displayed.       PT/INR         Recent Labs   Lab 01/05/20  1349 01/05/20  0546 01/04/20  0353 01/03/20  0430 01/02/20  0411 01/01/20  0051 12/31/19  2012   Sodium 135* 136 138  More results in Results Review 140  More results in Results Review 136   Potassium 5.4* 5.2* 5.1  More results in Results Review 4.1  More results in Results Review 3.7   Chloride 100 100 100  More results in Results Review 99*  More results in Results Review 92*   CO2 20* 21* 20*  More results in Results Review 27  More results in Results Review 25   BUN 71.0* 61.0* 52.0*  More results in Results Review 55.0*  More results in Results Review 33.0*   Creatinine 9.7* 9.1* 7.6*  More results in Results Review 7.7*  More results in Results Review 5.4*   Glucose 270* 189* 245*  More results in Results Review 208*  More results in Results Review 265*   Calcium 8.4* 8.4* 8.6  More results in Results Review 7.7*  More results in Results Review 7.9*   Phosphorus  --   --   --   --  5.5*  --   --    Protein, Total 6.7  --   --   --   --   --  7.3   Albumin 2.7*  --   --   --   --   --  2.7*   AST (SGOT) 9  --   --   --   --   --  20   ALT 8  --   --   --   --   --  10   Alkaline Phosphatase 76  --   --   --   --    --  121*   Bilirubin, Total 0.2  --   --   --   --   --  0.4   More results in Results Review = values in this interval not displayed.      Glucose POCT   Recent Labs   Lab 01/05/20  1349 01/05/20  0546 01/04/20  0353 01/03/20  0430 01/02/20  0411 01/01/20  0051 12/31/19  2012   Glucose 270* 189* 245* 140* 208* 244* 265*          Rads:     Radiology Results (24 Hour)     ** No results found for the last 24 hours. **            Signed by: Ernst Breach, MD

## 2020-01-05 NOTE — Plan of Care (Addendum)
Problem: Compromised Tissue integrity  Goal: Damaged tissue is healing and protected  01/05/2020 0802 by Ofilia Neas, RN  Outcome: Progressing  Flowsheets (Taken 01/05/2020 0802)  Damaged tissue is healing and protected:   Monitor/assess Braden scale every shift   Increase activity as tolerated/progressive mobility   Relieve pressure to bony prominences for patients at moderate and high risk   Keep intact skin clean and dry   Use incontinence wipes for cleaning urine, stool and caustic drainage. Foley care as needed   Monitor external devices/tubes for correct placement to prevent pressure, friction and shearing  01/04/2020 2002 by Ofilia Neas, RN  Outcome: Progressing  Flowsheets (Taken 01/04/2020 2002)  Damaged tissue is healing and protected:   Monitor/assess Braden scale every shift   Increase activity as tolerated/progressive mobility   Use incontinence wipes for cleaning urine, stool and caustic drainage. Foley care as needed     Problem: Compromised Hemodynamic Status  Goal: Vital signs and fluid balance maintained/improved  Outcome: Progressing  Flowsheets (Taken 01/05/2020 0802)  Vital signs and fluid balance are maintained/improved:   Position patient for maximum circulation/cardiac output   Monitor/assess vitals and hemodynamic parameters with position changes   Monitor and compare daily weight   Monitor intake and output. Notify LIP if urine output is less than 30 mL/hour.   Monitor/assess lab values and report abnormal values     Problem: Ineffective Airway Clearance  Goal: Airway is maintained  Outcome: Progressing     Problem: Inadequate Gas Exchange  Goal: Adequate oxygenation and improved ventilation  Outcome: Progressing  Flowsheets (Taken 01/05/2020 0802)  Adequate oxygenation and improved ventilation:   Assess lung sounds   Provide mechanical and oxygen support to facilitate gas exchange   Position for maximum ventilatory efficiency     Problem: Renal Instability  Goal: Fluid  and electrolyte balance are achieved/maintained  01/05/2020 0802 by Ofilia Neas, RN  Outcome: Progressing  Flowsheets (Taken 01/05/2020 0802)  Fluid and electrolyte balance are achieved/maintained:   Monitor intake and output every shift   Provide adequate hydration   Assess and reassess fluid and electrolyte status   Assess for confusion/personality changes  01/04/2020 2002 by Ofilia Neas, RN  Outcome: Progressing  Flowsheets (Taken 01/04/2020 2002)  Fluid and electrolyte balance are achieved/maintained:   Monitor intake and output every shift   Provide adequate hydration   Assess and reassess fluid and electrolyte status   Assess for confusion/personality changes   Pt a/o, resting comfortably in bed after bm.  Tolerated am meds, assessment.  Pt complaining of pain near rectum and peri area.  Worsening MASD, dr aware.  Lidocaine cream ordered and administered toward end of shift.  PT tolerated dialysis, 2.5 L removed as per note.  Pt fatigued after procedure.  Pt tolerated 100% dinner.

## 2020-01-05 NOTE — Progress Notes (Signed)
HD complete and tolerated well. 2.5 UF. L AVG WDL post tx. Hemostasis achieved. Pt had bm tech changed post tx. No other complaints given. Report given to the primary nurse Ofilia Neas RN.         01/05/20 1745   Vitals   Heart Rate 74   Resp Rate 22   BP 162/70   SpO2 96 %   O2 Device None (Room air)   Pain Assessment   Charting Type Reassessment   Pain Scale Used Numeric Scale (0-10)   Numeric Pain Scale   Pain Score 5   POSS Score 1   Pain Location Buttocks   Pain Orientation Inner   Pain Descriptors Burning   Pain Frequency Continuous   Effect of Pain on Daily Activities mild   Patient's Stated Comfort Functional Goal 0   Pain Intervention(s) Repositioned  (pt cleaned BM)   Education   Person taught Patient   Knowledge basis Substantial   Topics taught Access care   [place] Understanding   Bedside Nurse Communication   Name of bedside RN - post dialysis Ofilia Neas RN

## 2020-01-05 NOTE — Progress Notes (Signed)
[facility] PROGRESS NOTE  Aaron Edelman, x [phone] ([place])      Date Time: 01/05/20 1:34 PM  Patient Name: [name]  Attending Physician: Murlean Hark, MD    CC: follow-up esrd    Assessment:   1. ESRD on HD MWF at [facility]  2. Covid 19 pneumonia - on RA  3. Anemia in CKD  4. HTN  5. C diff      Recommendations:   1. HD today  2. Cont Aranesp   3. D/c florinef      Case discussed with: pt      Corky Mull, MD  [facility]  [phone] (office)  X [phone] ([place])      Subjective: on room air  No sob  Still w/diarrhea, has wound on buttocks which is painful    Review of Systems:   Review of Systems -  +diarrhea    Physical Exam:     Vitals:    01/04/20 2100 01/05/20 0500 01/05/20 0752 01/05/20 1214   BP: 171/79 171/83 146/67 171/71   Pulse: 61 60     Resp: 18 19 20 22    Temp: 97.6 F (36.4 C) 98 F (36.7 C) 97.7 F (36.5 C) 97.8 F (36.6 C)   TempSrc: Oral Oral Oral Oral   SpO2: 96%      Weight:  48.9 kg (107 lb 12.9 oz)     Height:           Intake and Output Summary (Last 24 hours) at Date Time  No intake or output data in the 24 hours ending 01/05/20 1334    Limiting exposure and conserving PPE during time of pandemic, as per CDC guidelines and [place] hospital policy.            Meds:      Scheduled Meds: PRN Meds:    atorvastatin, 40 mg, Oral, Daily  cholestyramine, 2 packet, Oral, BID  darbepoetin alfa, 60 mcg, Subcutaneous, Weekly  dexAMETHasone, 6 mg, Intravenous, Daily  dexAMETHasone, 10 mg, Intravenous, Once  fludrocortisone, 0.1 mg, Oral, Daily  heparin (porcine), 5,000 Units, Subcutaneous, Q12H [facility]  insulin glargine, 20 Units, Subcutaneous, QHS  insulin lispro, 1-4 Units, Subcutaneous, QHS  insulin lispro, 1-8 Units, Subcutaneous, TID AC  miconazole 2 % with zinc oxide, , Topical, Q6H  pantoprazole, 40 mg, Oral, QAM AC  sevelamer, 800 mg, Oral, TID MEALS  vancomycin, 125 mg, Oral, 4 times per day  vitamin C, 500 mg, Oral, Daily           Continuous Infusions:   acetaminophen, 650 mg, Q6H PRN   Or  acetaminophen, 650 mg, Q6H PRN  acetaminophen, 650 mg, Once PRN  acetaminophen, 650 mg, Q6H PRN  albumin human, 100 mL, PRN  bisacodyl, 5 mg, Daily PRN  dextrose, 15 g of glucose, PRN   And  dextrose, 12.5 g, PRN   And  glucagon (rDNA), 1 mg, PRN  guaiFENesin-codeine, 5 mL, Q4H PRN  hydrALAZINE, 10 mg, Q6H PRN  melatonin, 3 mg, QHS PRN  midodrine, 10 mg, TID PRN  naloxone, 0.2 mg, PRN  ondansetron, 4 mg, Q6H PRN  sodium chloride, 100 mL, Q1H PRN  sodium chloride, 250 mL, PRN  zinc Oxide, , PRN              Labs:     Recent Labs   Lab 01/05/20  0546 01/04/20  0353 01/03/20  0430   WBC 5.49 4.22 4.11   Hgb  8.7* 8.5* 9.0*   Hematocrit 29.4* 29.8* 31.0*   Platelets 267 257 238     Recent Labs   Lab 01/05/20  0546 01/04/20  0353 01/03/20  0430 01/02/20  0411 01/02/20  0411 01/01/20  0051 12/31/19  2012   Sodium 136 138 138  More results in Results Review 140  More results in Results Review 136   Potassium 5.2* 5.1 4.4  More results in Results Review 4.1  More results in Results Review 3.7   Chloride 100 100 99*  More results in Results Review 99*  More results in Results Review 92*   CO2 21* 20* 23  More results in Results Review 27  More results in Results Review 25   BUN 61.0* 52.0* 36.0*  More results in Results Review 55.0*  More results in Results Review 33.0*   Creatinine 9.1* 7.6* 5.8*  More results in Results Review 7.7*  More results in Results Review 5.4*   Calcium 8.4* 8.6 8.6  More results in Results Review 7.7*  More results in Results Review 7.9*   Albumin  --   --   --   --   --   --  2.7*   Phosphorus  --   --   --   --  5.5*  --   --    Glucose 189* 245* 140*  More results in Results Review 208*  More results in Results Review 265*   EGFR 5.3 6.5 8.9  More results in Results Review 6.4  More results in Results Review 9.6   More results in Results Review = values in this interval not displayed.             Invalid input(s): LEUKOCYTESUR         Imaging personally reviewed, including: No results found.        Signed by: Corky Mull, MD

## 2020-01-06 LAB — GLUCOSE WHOLE BLOOD - POCT
Whole Blood Glucose POCT: 164 mg/dL — ABNORMAL HIGH (ref 70–100)
Whole Blood Glucose POCT: 209 mg/dL — ABNORMAL HIGH (ref 70–100)
Whole Blood Glucose POCT: 349 mg/dL — ABNORMAL HIGH (ref 70–100)
Whole Blood Glucose POCT: 254 mg/dL — ABNORMAL HIGH (ref 70–100)

## 2020-01-06 LAB — CBC
Absolute NRBC: 0 10*3/uL (ref 0.00–0.00)
Hematocrit: 29 % — ABNORMAL LOW (ref 34.7–43.7)
Hgb: 8.7 g/dL — ABNORMAL LOW (ref 11.4–14.8)
MCH: 28.8 pg (ref 25.1–33.5)
MCHC: 30 g/dL — ABNORMAL LOW (ref 31.5–35.8)
MCV: 96 fL (ref 78.0–96.0)
MPV: 10.4 fL (ref 8.9–12.5)
Nucleated RBC: 0 /100 WBC (ref 0.0–0.0)
Platelets: 208 10*3/uL (ref 142–346)
RBC: 3.02 10*6/uL — ABNORMAL LOW (ref 3.90–5.10)
RDW: 14 % (ref 11–15)
WBC: 6.15 10*3/uL (ref 3.10–9.50)

## 2020-01-06 LAB — BASIC METABOLIC PANEL
Anion Gap: 11 (ref 5.0–15.0)
BUN: 46 mg/dL — ABNORMAL HIGH (ref 7.0–19.0)
CO2: 26 mEq/L (ref 22–29)
Calcium: 8.3 mg/dL — ABNORMAL LOW (ref 8.5–10.5)
Chloride: 101 mEq/L (ref 100–111)
Creatinine: 7.4 mg/dL — ABNORMAL HIGH (ref 0.6–1.0)
Glucose: 201 mg/dL — ABNORMAL HIGH (ref 70–100)
Potassium: 4.7 mEq/L (ref 3.5–5.1)
Sodium: 138 mEq/L (ref 136–145)

## 2020-01-06 LAB — HEMOLYSIS INDEX: Hemolysis Index: 9 (ref 0–18)

## 2020-01-06 LAB — GFR: EGFR: 6.7

## 2020-01-06 MED ORDER — AMLODIPINE BESYLATE 5 MG PO TABS
5.0000 mg | ORAL_TABLET | Freq: Every day | ORAL | Status: DC
Start: 2020-01-06 — End: 2020-01-07
  Administered 2020-01-06 – 2020-01-07 (×2): 5 mg via ORAL
  Filled 2020-01-06 (×2): qty 1

## 2020-01-06 MED ORDER — CHOLESTYRAMINE 4 G PO PACK
2.0000 | PACK | Freq: Two times a day (BID) | ORAL | 0 refills | Status: DC
Start: 2020-01-06 — End: 2020-01-22

## 2020-01-06 MED ORDER — INSULIN GLARGINE 100 UNIT/ML SC SOLN
20.0000 [IU] | Freq: Every evening | SUBCUTANEOUS | 0 refills | Status: DC
Start: 2020-01-06 — End: 2020-10-20

## 2020-01-06 MED ORDER — LIDOCAINE 2 % EX GEL
1.0000 | Freq: Four times a day (QID) | CUTANEOUS | 0 refills | Status: DC
Start: 2020-01-06 — End: 2020-10-20

## 2020-01-06 MED ORDER — HYDRALAZINE HCL 25 MG PO TABS
25.0000 mg | ORAL_TABLET | Freq: Three times a day (TID) | ORAL | Status: DC | PRN
Start: 2020-01-06 — End: 2020-01-08
  Administered 2020-01-06 – 2020-01-08 (×2): 25 mg via ORAL
  Filled 2020-01-06 (×2): qty 1

## 2020-01-06 MED ORDER — MIDODRINE HCL 10 MG PO TABS
10.0000 mg | ORAL_TABLET | Freq: Three times a day (TID) | ORAL | Status: DC | PRN
Start: 2020-01-06 — End: 2020-10-20

## 2020-01-06 MED ORDER — ZINC OXIDE 40 % EX PSTE
PASTE | CUTANEOUS | 0 refills | Status: DC | PRN
Start: 2020-01-06 — End: 2020-10-20

## 2020-01-06 MED ORDER — AMLODIPINE BESYLATE 5 MG PO TABS
5.0000 mg | ORAL_TABLET | Freq: Every day | ORAL | 0 refills | Status: DC
Start: 2020-01-06 — End: 2020-01-07

## 2020-01-06 MED ORDER — FIDAXOMICIN 200 MG PO TABS
200.0000 mg | ORAL_TABLET | Freq: Two times a day (BID) | ORAL | 0 refills | Status: DC
Start: 2020-01-06 — End: 2020-01-14

## 2020-01-06 NOTE — Plan of Care (Signed)
Problem: Moderate/High Fall Risk Score >5  Goal: Patient will remain free of falls  Outcome: Progressing  Flowsheets (Taken 01/06/2020 1100)  High (Greater than 13):   HIGH-Initiate use of floor mats as appropriate   HIGH-Apply yellow "Fall Risk" arm band   HIGH-Visual cue at entrance to patient's room     Problem: Safety  Goal: Patient will be free from injury during hospitalization  Outcome: Progressing  Flowsheets (Taken 01/03/2020 0216 by Roney Marion, RN)  Patient will be free from injury during hospitalization:   Assess patient's risk for falls and implement fall prevention plan of care per policy   Provide and maintain safe environment   Use appropriate transfer methods   Ensure appropriate safety devices are available at the bedside   Hourly rounding     Problem: Pain  Goal: Pain at adequate level as identified by patient  Outcome: Progressing  Flowsheets (Taken 01/03/2020 0216 by Roney Marion, RN)  Pain at adequate level as identified by patient:   Identify patient comfort function goal   Assess for risk of opioid induced respiratory depression, including snoring/sleep apnea. Alert healthcare team of risk factors identified.   Assess pain on admission, during daily assessment and/or before any "as needed" intervention(s)   Evaluate if patient comfort function goal is met   Evaluate patient's satisfaction with pain management progress   Offer non-pharmacological pain management interventions   Include patient/patient care companion in decisions related to pain management as needed     Problem: Compromised Tissue integrity  Goal: Damaged tissue is healing and protected  Outcome: Progressing  Flowsheets (Taken 01/06/2020 0645 by Edwena Felty, RN)  Damaged tissue is healing and protected:   Monitor/assess Braden scale every shift   Provide wound care per wound care algorithm   Reposition patient every 2 hours and as needed unless able to reposition self   Increase activity as tolerated/progressive  mobility   Relieve pressure to bony prominences for patients at moderate and high risk   Keep intact skin clean and dry   Use bath wipes, not soap and water, for daily bathing   Avoid shearing injuries   Use incontinence wipes for cleaning urine, stool and caustic drainage. Foley care as needed   Monitor external devices/tubes for correct placement to prevent pressure, friction and shearing   Encourage use of lotion/moisturizer on skin   Monitor patient's hygiene practices   Consult/collaborate with wound care nurse   Utilize specialty bed   Consider placing an indwelling catheter if incontinence interferes with healing of stage 3 or 4 pressure injury     Problem: Compromised Hemodynamic Status  Goal: Vital signs and fluid balance maintained/improved  Outcome: Progressing  Flowsheets (Taken 01/05/2020 0802 by Ofilia Neas, RN)  Vital signs and fluid balance are maintained/improved:   Position patient for maximum circulation/cardiac output   Monitor/assess vitals and hemodynamic parameters with position changes   Monitor and compare daily weight   Monitor intake and output. Notify LIP if urine output is less than 30 mL/hour.   Monitor/assess lab values and report abnormal values     Problem: Renal Instability  Goal: Fluid and electrolyte balance are achieved/maintained  Outcome: Progressing  Flowsheets (Taken 01/06/2020 0645 by Edwena Felty, RN)  Fluid and electrolyte balance are achieved/maintained:   Monitor/assess lab values and report abnormal values   Monitor daily weight   Assess for confusion/personality changes   Monitor intake and output every shift   Provide adequate hydration   Assess and reassess fluid  and electrolyte status   Observe for seizure activity and initiate seizure precautions if indicated   Observe for cardiac arrhythmias   Monitor for muscle weakness

## 2020-01-06 NOTE — Progress Notes (Signed)
SBAR verbal and bedside received from [employer].    A/O x 4. MAE, Unsteady gait.strong grips; able to turn self. NSR. on the monitor;  SBP: 149-194 this shift; PRN Hydralazine admin x 1. Denies chest pain.Palpable pulses. O2 Sat=99%. On room air; Not in respiratory distress/DOE; Incontinent of bowel and bladder, female ext cath in place. Moisture associated skin breakdown (redness) to groin. Skin/ incontinence care rendered. Moisture barrier cream applied.  Kept clean, dry and comfortable. Encouraged to call for assistance as needed. Safety maintained. Bed placed in the low position with call bell in reach, bed alarm on, and hourly rounding completed. No further needs at this time. Will continue to monitor patient.      OT worked with pt today; d/c recommendation= home with home helath PT/OT     SBAR verbal and bedside given to night nurse Allena Katz. Opportunity for questions provided.

## 2020-01-06 NOTE — UM Notes (Signed)
[facility] Utilization Review   NPI #[id number], Tax ID [id number]  Please call Milas Gain MSN RN CCM @ [phone] with any questions or concerns.  Email:  [email or web address]  Fax final authorization and requests for additional information to [phone]    CONCURRENT REVIEW FOR: 01/06/20    LOC: IMCU    PATIENT NAME: [name],[name] / [birth year] / AGE: 64 y.o.          V/S:    Vitals:    01/06/20 1354   BP: 157/81   Pulse: 64   Resp:    Temp:    SpO2: 98%          Temperature: Temp  Min: 97.4 F (36.3 C)  Max: 98 F (36.7 C)  Pulse: Pulse  Min: 64  Max: 90  Respiratory: Resp  Min: 15  Max: 22  Non-Invasive BP: BP  Min: 115/61  Max: 196/86  Pulse Oximetry SpO2  Min: 94 %  Max: 98 %        Labs -  Lab 01/06/20  0805   RBC 3.02*   Hgb 8.7*   Hematocrit 29.0*       Lab 01/06/20  0804   BUN 46.0*   Creatinine 7.4*   Glucose 201*   Calcium 8.3*             MD NOTES:  NEPHROLOGY  NOTES  Recommendations:   1. S/p HD yesterday with 2.5L net ultrafiltration; next HD tomorrow  2. Cont Aranesp  3. Continue sevelamer  4. Start amlodipine      ID NOTES  Assessment and Plan:   1.  Covid  C-reactive protein 17.9  Ferritin 3158  2.  C. Difficile, failed oral vancomycin  Plan: 1 fidoxamicin and cholestyramine      MEDICINE NOTES  Patient Active Hospital Problem List:  Pneumonia,secondary to COVID 19 virus  H/o COPD  -No h/o chronic respiratory failure, patient weaned off supplemental oxygen   -steroids: transition from dexamethasone to medrol pack on discharge  -Does not meet criteria for remdesivir  -Encourage IS use    Diarrhea, C diff +  -On admission, > 12 watery BMs/ 24 hours. Shigella/Salmonella/Campylobacter tests are negative  -No BM since last night  -ID  consulted.   -failed po vanc and flagyl  -on fidoxamicin and cholestyramine (started 12/13-)  -Lidocaine gel and desitin for peri-anal irritation     Iron Deficiency Anemia  -on Aranesp per nephrology    ESRD on HD, MWF.  Nephrology consulted and following    DM2, on insulin. HbA1c 10.2  -continue lantus 20units QHS and SSI with meals and at bedtime  -needs to follow-up with PCP with BG log post discharge for further management of DM    HTN, chronic  -on prn midodrine  -started amlodipine (01/06/20)  -stopped fludrocortisone after discussion with nephrology     Dyslipidemia, chronic, continue lipitor    H/o CVA, no acute deficits      Nutrition: carbohydrate consistent diet    DVT Prophylaxis:   DISPO: tbd    Current Medications:   Scheduled Meds:  Current Facility-Administered Medications   Medication Dose Route Frequency    amLODIPine  5 mg Oral Daily    atorvastatin  40 mg Oral Daily    cholestyramine  2 packet Oral BID    darbepoetin alfa  60 mcg Subcutaneous Weekly    dexAMETHasone  6 mg Intravenous Daily    dexAMETHasone  10 mg Intravenous Once  fidaxomicin  200 mg Oral BID    heparin (porcine)  5,000 Units Subcutaneous Q12H [place]    insulin glargine  20 Units Subcutaneous QHS    insulin lispro  1-4 Units Subcutaneous QHS    insulin lispro  1-8 Units Subcutaneous TID AC    lidocaine   Topical QID    miconazole 2 % with zinc oxide   Topical Q6H    pantoprazole  40 mg Oral QAM AC    sevelamer  800 mg Oral TID MEALS    vitamin C  500 mg Oral Daily

## 2020-01-06 NOTE — Progress Notes (Signed)
[facility] PROGRESS NOTE  Aaron Edelman, x [phone] ([place])      Date Time: 01/06/20 6:58 AM  Patient Name: [name]  Attending Physician: Murlean Hark, MD    CC: follow-up esrd    Assessment:   1. ESRD on HD MWF at [facility]  2. Covid 19 pneumonia - on RA  3. Anemia in CKD  4. HTN  5. C diff      Recommendations:   1. S/p HD yesterday with 2.5L net ultrafiltration; next HD tomorrow  2. Cont Aranesp  3. Continue sevelamer  4. Start amlodipine    Case discussed with: nobody      Verl Blalock, MD  [facility]  [phone] (office)  X [phone] ([place])      Subjective: on room air, hypertensive this morning    Review of Systems:   Deferred today; per chart review, persistent diarrhea    Physical Exam:     Vitals:    01/05/20 1915 01/05/20 2100 01/06/20 0019 01/06/20 0411   BP: 135/63  196/86 188/81   Pulse: 90 84 84 80   Resp: 21  22 18    Temp: 97.4 F (36.3 C)  98 F (36.7 C)    TempSrc: Oral  Oral    SpO2: 95% 94% 95% 98%   Weight:    98.3 kg (216 lb 11.4 oz)   Height:           Intake and Output Summary (Last 24 hours) at Date Time    Intake/Output Summary (Last 24 hours) at 01/06/2020 5056  Last data filed at 01/05/2020 1830  Gross per 24 hour   Intake 400 ml   Output 300 ml   Net 100 ml       Limiting exposure and conserving PPE during time of pandemic, as per CDC guidelines and [place] hospital policy.            Meds:      Scheduled Meds: PRN Meds:    atorvastatin, 40 mg, Oral, Daily  cholestyramine, 2 packet, Oral, BID  darbepoetin alfa, 60 mcg, Subcutaneous, Weekly  dexAMETHasone, 6 mg, Intravenous, Daily  dexAMETHasone, 10 mg, Intravenous, Once  fidaxomicin, 200 mg, Oral, BID  heparin (porcine), 5,000 Units, Subcutaneous, Q12H [place]  insulin glargine, 20 Units, Subcutaneous, QHS  insulin lispro, 1-4 Units, Subcutaneous, QHS  insulin lispro, 1-8 Units, Subcutaneous, TID AC  lidocaine, , Topical, QID  miconazole 2 % with zinc oxide, , Topical,  Q6H  pantoprazole, 40 mg, Oral, QAM AC  sevelamer, 800 mg, Oral, TID MEALS  vitamin C, 500 mg, Oral, Daily          Continuous Infusions:   acetaminophen, 650 mg, Q6H PRN   Or  acetaminophen, 650 mg, Q6H PRN  acetaminophen, 650 mg, Once PRN  acetaminophen, 650 mg, Q6H PRN  bisacodyl, 5 mg, Daily PRN  dextrose, 15 g of glucose, PRN   And  dextrose, 12.5 g, PRN   And  glucagon (rDNA), 1 mg, PRN  guaiFENesin-codeine, 5 mL, Q4H PRN  hydrALAZINE, 10 mg, Q6H PRN  [START ON 01/07/2020] lidocaine, , 4X Daily PRN  melatonin, 3 mg, QHS PRN  midodrine, 10 mg, TID PRN  naloxone, 0.2 mg, PRN  ondansetron, 4 mg, Q6H PRN  zinc Oxide, , PRN              Labs:     Recent Labs   Lab 01/05/20  1349 01/05/20  0546 01/04/20  0353  WBC 8.26 5.49 4.22   Hgb 8.3* 8.7* 8.5*   Hematocrit 28.2* 29.4* 29.8*   Platelets 239 267 257     Recent Labs   Lab 01/05/20  1349 01/05/20  0546 01/04/20  0353 01/03/20  0430 01/02/20  0411 01/01/20  0051 12/31/19  2012   Sodium 135* 136 138  More results in Results Review 140  More results in Results Review 136   Potassium 5.4* 5.2* 5.1  More results in Results Review 4.1  More results in Results Review 3.7   Chloride 100 100 100  More results in Results Review 99*  More results in Results Review 92*   CO2 20* 21* 20*  More results in Results Review 27  More results in Results Review 25   BUN 71.0* 61.0* 52.0*  More results in Results Review 55.0*  More results in Results Review 33.0*   Creatinine 9.7* 9.1* 7.6*  More results in Results Review 7.7*  More results in Results Review 5.4*   Calcium 8.4* 8.4* 8.6  More results in Results Review 7.7*  More results in Results Review 7.9*   Albumin 2.7*  --   --   --   --   --  2.7*   Phosphorus  --   --   --   --  5.5*  --   --    Glucose 270* 189* 245*  More results in Results Review 208*  More results in Results Review 265*   EGFR 4.9 5.3 6.5  More results in Results Review 6.4  More results in Results Review 9.6   More results in Results Review = values in  this interval not displayed.           Signed by: Verl Blalock, MD

## 2020-01-06 NOTE — Progress Notes (Signed)
[facility]  PROGRESS NOTE      Patient: [name]  Date: 01/06/2020   LOS: 6 Days  Admission Date: 12/31/2019   MRN: [id number]  Attending: Neysa Bonito, MD  Please contact me on Epic Secure chat     ASSESSMENT/PLAN     [name] is a 64 y.o. female admitted with Pneumonia due to COVID-19 virus    Interval Summary:     Patient Active Hospital Problem List:     Pneumonia,secondary to [place] 19 virus  H/o COPD  -No h/o chronic respiratory failure, patient weaned off supplemental oxygen   -steroids: transition from dexamethasone to medrol pack on discharge  -Does not meet criteria for remdesivir  -Encourage IS use    Diarrhea, C diff +  -On admission, > 12 watery BMs/ 24 hours. Shigella/Salmonella/Campylobacter tests are negative  -No BM since last night  -ID Dr. Lahoma Crocker consulted.   -failed po vanc and flagyl  -on fidoxamicin and cholestyramine (started 12/13-)  -Lidocaine gel and desitin for peri-anal irritation     Iron Deficiency Anemia  -on Aranesp per nephrology    ESRD on HD, MWF. Nephrology consulted and following    DM2, on insulin. HbA1c 10.2  -continue lantus 20units QHS and SSI with meals and at bedtime  -needs to follow-up with PCP with BG log post discharge for further management of DM    HTN, chronic  -on prn midodrine  -started amlodipine (01/06/20)  -stopped fludrocortisone after discussion with nephrology     Dyslipidemia, chronic, continue lipitor    H/o CVA, no acute deficits      Nutrition: carbohydrate consistent diet    DVT Prophylaxis:        Code Status: FULL     DISPO: tbd    Family Contact: patient did not want anyone updated at this time 01/06/20       [place] no acute events overnight.  Patient has not had a bowel movement since last night.  Pain is improved and manageable.     MEDICATIONS     Current Facility-Administered Medications   Medication Dose Route Frequency    atorvastatin  40 mg Oral Daily    cholestyramine  2 packet Oral  BID    darbepoetin alfa  60 mcg Subcutaneous Weekly    dexAMETHasone  6 mg Intravenous Daily    dexAMETHasone  10 mg Intravenous Once    fidaxomicin  200 mg Oral BID    heparin (porcine)  5,000 Units Subcutaneous Q12H [place]    insulin glargine  20 Units Subcutaneous QHS    insulin lispro  1-4 Units Subcutaneous QHS    insulin lispro  1-8 Units Subcutaneous TID AC    lidocaine   Topical QID    miconazole 2 % with zinc oxide   Topical Q6H    pantoprazole  40 mg Oral QAM AC    sevelamer  800 mg Oral TID MEALS    vitamin C  500 mg Oral Daily       PHYSICAL EXAM     Vitals:    01/06/20 1354   BP: 157/81   Pulse: 64   Resp:    Temp:    SpO2: 98%       Temperature: Temp  Min: 97.4 F (36.3 C)  Max: 98 F (36.7 C)  Pulse: Pulse  Min: 64  Max: 90  Respiratory: Resp  Min: 15  Max: 22  Non-Invasive  BP: BP  Min: 115/61  Max: 196/86  Pulse Oximetry SpO2  Min: 94 %  Max: 98 %    Intake and Output Summary (Last 24 hours) at Date Time    Intake/Output Summary (Last 24 hours) at 01/06/2020 1538  Last data filed at 01/05/2020 1830  Gross per 24 hour   Intake 400 ml   Output 300 ml   Net 100 ml         GEN APPEARANCE: Normal;  A&OX3  HEENT: PERLA; EOMI; Conjunctiva Clear  NECK: Supple; No bruits  CVS: RRR, S1, S2; No M/G/R  LUNGS: CTAB; No Wheezes; No Rhonchi: No rales  ABD: Soft; No TTP; + Normoactive BS  EXT: No edema; Pulses 2+ and intact  Skin exam:  Refer to nursing note for full skin assessment  NEURO: CN 2-12 intact; No Focal neurological deficits  CAP REFILL:  Normal  MENTAL STATUS:  Normal        LABS     Recent Labs   Lab 01/06/20  0805 01/05/20  1349 01/05/20  0546   WBC 6.15 8.26 5.49   RBC 3.02* 2.93* 3.03*   Hgb 8.7* 8.3* 8.7*   Hematocrit 29.0* 28.2* 29.4*   MCV 96.0 96.2* 97.0*   Platelets 208 239 267       Recent Labs   Lab 01/06/20  0804 01/05/20  1349 01/05/20  0546 01/04/20  0353 01/03/20  0430   Sodium 138 135* 136 138 138   Potassium 4.7 5.4* 5.2* 5.1 4.4   Chloride 101 100 100 100 99*   CO2 26 20*  21* 20* 23   BUN 46.0* 71.0* 61.0* 52.0* 36.0*   Creatinine 7.4* 9.7* 9.1* 7.6* 5.8*   Glucose 201* 270* 189* 245* 140*   Calcium 8.3* 8.4* 8.4* 8.6 8.6       Recent Labs   Lab 01/05/20  1349 12/31/19  2012   ALT 8 10   AST (SGOT) 9 20   Bilirubin, Total 0.2 0.4   Albumin 2.7* 2.7*   Alkaline Phosphatase 76 121*       Recent Labs   Lab 01/01/20  0526 01/01/20  0051 12/31/19  2012   Troponin I 0.05 0.05 0.06*             Microbiology Results (last 15 days)     Procedure Component Value Units Date/Time    Clostridium difficile toxin B PCR [[id number]]  (Abnormal) Collected: 01/01/20 2336    Order Status: Completed Specimen: Stool Updated: 01/03/20 0130     Stool Clostridium difficile Toxin B PCR Positive     Comment: Test performed using a BDMax PCR Assay. Due to the high  sensitivity of the assay, repeat testing is not recommended  and will be cancelled following [facility] policy. Molecular  detection of C. difficile is not recommended as a test of cure  or for surveillance purposes. A positive test may indicate  infection or colonization with C. difficile. Testing will not  be performed on formed stool.         Narrative:      Patient's current admission began:->less than or equal to 3  days ago  Cancel C-Diff order if no diarrhea in 12 hours?->Yes    cdiff ok to process per dr Manson Allan    Stool Cryptosporidium / Giardia Antigen [[id number]] Collected: 01/01/20 2336    Order Status: Completed Specimen: Stool Updated: 01/02/20 0900    Narrative:      ORDER#: [id number]  ORDERED BY: ZAMAN, TARIQUE  SOURCE: Stool                                        COLLECTED:  01/01/20 23:36  ANTIBIOTICS AT COLL.:                                RECEIVED :  01/02/20 05:24  Cryptosporidium/Giardia Antigen            FINAL       01/02/20 09:00  01/02/20   Negative for Cryptosporidium parvum antigen             Negative for Giardia lamblia antigen      Stool for Salm/Shig/Campy/Shiga PCR [[id number]] Collected:  01/01/20 2336    Order Status: Completed Specimen: Stool Updated: 01/02/20 1627     Stool Salmonella Species by PCR Negative     Stool Shigella Species/Enteroinvasive Escherichia coli PCR Negative     Stool Campylobacter jejunii/coli by PCR Negative     Stool Shiga Toxin by PCR Negative     Comment: Testing performed using a BDMax multiplex PCR panel which  detects bacterial DNA. A positive result does not necessarily  indicate the presence of viable organisms. Positive results  do not rule out co-infection with other organisms that are  not detected by this test. This panel does not differentiate  between Shigella spp. and Enteroinvasive Escherichia coli  (EIEC), which are closely related and may cause similar  illness.  A result of Shiga Toxin POSITIVE, Shigella/EIEC NEGATIVE is  most likely due to the presence of Shiga toxin-producing  E. coli (STEC). Shigella species isolated in the [place]  are rarely positive for Shiga Toxin.  Serotyping of Salmonella, Shigella, or Shiga Toxin DNA  positive samples will be performed by [facility] for public health  surveillance purposes.  Serotyping results are available upon request.         COVID-19 (SARS-CoV-2) and Influenza A/B, NAA (Liat Rapid) [[id number]]  (Abnormal) Collected: 12/31/19 2054    Order Status: Completed Specimen: Culturette from Nasopharyngeal Updated: 12/31/19 2213     Purpose of COVID testing Diagnostic -[facility]     SARS-CoV-2 Specimen Source Nasopharyngeal     SARS CoV 2 Overall Result Detected     Comment: Positive COVID results called to Dr.He.  Readback confirmed by [id number] at 22:13  12/31/2019  Test performed using the Roche cobas Liat SARS-CoV-2 assay. This assay is  only for use under the Food and Drug Administrations Emergency Use  Authorization. This is a real-time RT-PCR assay for the qualitative  detection of SARS-CoV-2 RNA. Viral nucleic acids may persist in vivo,  independent of viability. Detection of viral nucleic acid does not imply  the  presence of infectious virus, or that virus nucleic acid is the cause of  clinical symptoms. Negative results do not preclude SARS-CoV-2 infection and  should not be used as the sole basis for diagnosis, treatment or other  patient management decisions. Negative results must be combined with  clinical observations, patient history, and/or epidemiological information.  Invalid results may be due to inhibiting substances in the specimen and  recollection should occur. Please see Fact Sheets for patients and providers  located:  https://www.[employer]-chung.com/.          Influenza A Not Detected     Comment: Influenza A  NOT Detected results should be considered presumptive in  samples that have a positive SARS-CoV-2 (COVID-19) result. If co-infection  with influenza A virus is suspected and influenza virus detection would  change clinical management, please order influenza specific testing.          Influenza B Not Detected     Comment: Influenza B NOT Detected results should be considered presumptive in samples  that have a positive SARS-CoV-2 (COVID-19) result. If co-infection with  influenza B virus is suspected and influenza virus detection would change  clinical management, please order influenza specific testing.  Test performed using the Roche cobas Liat SARS-CoV-2 & Influenza A/B assay.  This assay is only for use under the Food and Drug Administrations  Emergency Use Authorization. This is a multiplex real-time RT-PCR assay  intended for the simultaneous in vitro qualitative detection and  differentiation of SARS-CoV-2, influenza A, and influenza B virus RNA. Viral  nucleic acids may persist in vivo, independent of viability. Detection of  viral nucleic acid does not imply the presence of infectious virus, or that  virus nucleic acid is the cause of clinical symptoms. Negative results do  not preclude SARS-CoV-2, influenza A, and/or influenza B infection and  should not be used as the sole  basis for diagnosis, treatment or other  patient management decisions. Negative results must be combined with  clinical observations, patient history, and/or epidemiological information.  Invalid results may be due to inhibiting substances in the specimen and  recollection should occur. Please see Fact Sheets for patients and providers  located: http://[facility].info/.         Narrative:      o Collect and clearly label specimen type:  o PREFERRED-Upper respiratory specimen: One Nasopharyngeal  Swab in Transport Media.  o Hand deliver to laboratory ASAP  Diagnostic -[facility]    Blood Culture #1 [[id number]] Collected: 12/31/19 2012    Order Status: Completed Specimen: Arm from Blood, Venipuncture Updated: 01/06/20 0521    Narrative:      ORDER#: [id number]                                    ORDERED BY: HE, ALBERT  SOURCE: Blood, Venipuncture Arm                      COLLECTED:  12/31/19 20:12  ANTIBIOTICS AT COLL.:                                RECEIVED :  01/01/20 02:23  Culture Blood Aerobic and Anaerobic        FINAL       01/06/20 05:21  01/06/20   No growth after 5 days of incubation.      Blood Culture #2 [[id number]] Collected: 12/31/19 2012    Order Status: Completed Specimen: Arm from Blood, Venipuncture Updated: 01/06/20 0521    Narrative:      ORDER#: [id number]                                    ORDERED BY: HE, ALBERT  SOURCE: Blood, Venipuncture Arm                      COLLECTED:  12/31/19 20:12  ANTIBIOTICS AT COLL.:  RECEIVED :  01/01/20 02:23  Culture Blood Aerobic and Anaerobic        FINAL       01/06/20 05:21  01/06/20   No growth after 5 days of incubation.             RADIOLOGY     Chest AP Portable    Result Date: 12/31/2019  Bilateral hazy and consolidation throughout the peripheries of the midlungs and lung bases which represent atypical/viral pneumonia, or asymmetric pulmonary edema. Raphael Gibney, DO  12/31/2019 8:02 PM        Signed,  Neysa Bonito, MD  3:38 PM 01/06/2020

## 2020-01-06 NOTE — Plan of Care (Signed)
Significant Event: Vss. Afebrile. Nonslip socks and SCDs on. Call bell and belongings in reach. Bed in lowest position and locked. Bed alarm on. Floor mats present.  Neuro: A/ox4 Appropriate mood and affect. Anxious at times.   Cardiac: NSR  Respiratory: RA. Clear LS.  GI: Tender abd. Diarrhea c.diff +  GU: Low output r/t diaylsis  Skin: MASD Peri, and sacrum area    Problem: Moderate/High Fall Risk Score >5  Goal: Patient will remain free of falls  Outcome: Progressing  Flowsheets (Taken 01/05/2020 2100)  High (Greater than 13):   HIGH-Consider use of low bed   HIGH-Initiate use of floor mats as appropriate   HIGH-Pharmacy to initiate evaluation and intervention per protocol   HIGH-Apply yellow "Fall Risk" arm band   HIGH-Utilize chair pad alarm for patient while in the chair   HIGH-Bed alarm on at all times while patient in bed   HIGH-Visual cue at entrance to patient's room   LOW-Fall Interventions Appropriate for Low Fall Risk     Problem: Safety  Goal: Patient will be free from injury during hospitalization  Outcome: Progressing     Problem: Compromised Tissue integrity  Goal: Damaged tissue is healing and protected  Outcome: Progressing  Flowsheets (Taken 01/06/2020 0645)  Damaged tissue is healing and protected:   Monitor/assess Braden scale every shift   Provide wound care per wound care algorithm   Reposition patient every 2 hours and as needed unless able to reposition self   Increase activity as tolerated/progressive mobility   Relieve pressure to bony prominences for patients at moderate and high risk   Keep intact skin clean and dry   Use bath wipes, not soap and water, for daily bathing   Avoid shearing injuries   Use incontinence wipes for cleaning urine, stool and caustic drainage. Foley care as needed   Monitor external devices/tubes for correct placement to prevent pressure, friction and shearing   Encourage use of lotion/moisturizer on skin   Monitor patient's hygiene practices    Consult/collaborate with wound care nurse   Utilize specialty bed   Consider placing an indwelling catheter if incontinence interferes with healing of stage 3 or 4 pressure injury     Problem: Inadequate Gas Exchange  Goal: Patent Airway maintained  Outcome: Progressing  Flowsheets (Taken 01/06/2020 0645)  Patent airway maintained:   Position patient for maximum ventilatory efficiency   Provide adequate fluid intake to liquefy secretions   Suction secretions as needed   Reinforce use of ordered respiratory interventions (i.e. CPAP, BiPAP, Incentive Spirometer, Acapella, etc.)   Reposition patient every 2 hours and as needed unless able to self-reposition     Problem: Renal Instability  Goal: Fluid and electrolyte balance are achieved/maintained  Outcome: Progressing  Flowsheets (Taken 01/06/2020 0645)  Fluid and electrolyte balance are achieved/maintained:   Monitor/assess lab values and report abnormal values   Monitor daily weight   Assess for confusion/personality changes   Monitor intake and output every shift   Provide adequate hydration   Assess and reassess fluid and electrolyte status   Observe for seizure activity and initiate seizure precautions if indicated   Observe for cardiac arrhythmias   Monitor for muscle weakness

## 2020-01-06 NOTE — OT Eval Note (Signed)
[facility]  Occupational Therapy Evaluation and Treatment    Patient: [name]  MRN#: [id number]   Unit: [place] [facility] INTERMEDIATE CARE  Bed: I2641/R8309-M    Time of Evaluation and Treatment:  Time Calculation  OT Received On: 01/06/20  Start Time: 0926  Stop Time: 0949  Time Calculation (min): 23 min    Chart Review and Collaboration with Care Team: 5 minutes, not included in above time.    Evaluation: 10 minutes  Treatment:  13 minutes    OT Visit Number: 1    Consult received for Janeece Young for OT Evaluation and Treatment.  Patients medical condition is appropriate for Occupational therapy intervention at this time.    ___________________________________________________    POST ACUTE CARE THERAPY RECOMMENDATIONS:   Discharge Recommendation: Home with supervision,Home with home health OT,Home with home health PT       DME Recommended for Discharge: Shower chair      ACUTE CARE THERAPY RECOMMENDATIONS:  Patient would benefit from continued skilled OT to address deficits listed below and increase functional independence.     Is PT evaluation indicated at this time? No, an acute care PT evaluation is not indicated yet; OT will continue to assess.      (Therapy recommendations are subject to change with patient status.  Please refer to the most recent OT/PT note for up-to-date recommendations.)  ___________________________________________________      Activity Orders:  OT eval and treat and progressive mobility protocol    Precautions and Contraindications:  Precautions  Weight Bearing Status: no restrictions  Other Precautions: contact special, airborne, falls    Personal Protective Equipment (PPE)  gloves, procedure gown, procedure mask, eye shield/covering, reusable respirator and surgical / bouffant cap    Medical Diagnosis:  SOB (shortness of breath) [R06.02]  ESRD (end stage renal disease) on dialysis [N18.6, Z99.2]  Elevated troponin I level [R77.8]  Pneumonia due to  COVID-19 virus [U07.1, J12.82]    History of Present Illness:  [name] is a 64 y.o. female admitted on 12/31/2019 with  Pneumonia due to COVID-19 virus    Patient Active Problem List   Diagnosis    Iron deficiency anemia secondary to inadequate dietary iron intake    Sideropenic dysphagia    Peritoneal dialysis catheter dysfunction, initial encounter    Peritonitis    Upper GI bleed    ESRD (end stage renal disease) on dialysis    Generalized abdominal pain    Thrombocytopenia    h/o orthostatic hypotension    Type 2 diabetes mellitus, with long-term current use of insulin    GERD (gastroesophageal reflux disease)    Bowel incontinence    Right sided numbness    Uncontrolled type 2 diabetes mellitus with hyperglycemia    Hypertension    Hyperlipidemia    History of COPD    Gastritis with hemorrhage    History of anemia due to chronic kidney disease    Open wound, abdominal wall, lateral, subsequent encounter    Gout    Dizziness    Fall    History of CVA (cerebrovascular accident)    Shortness of breath    Pneumonia due to COVID-19 virus    Diarrhea        Past Medical/Surgical History:  Past Medical History:   Diagnosis Date    Chronic obstructive pulmonary disease     Diabetes mellitus     Hyperlipidemia     Hypertension     Sleep  apnea 2018     Past Surgical History:   Procedure Laterality Date    EGD, BIOPSY N/A 05/14/2019    Procedure: EGD, BIOPSY;  Surgeon: Ronie Spies, MD;  Location: [facility];  Service: Gastroenterology;  Laterality: N/A;    EXPLORATORY LAPAROTOMY N/A 05/27/2019    Procedure: EXPLORATORY LAPAROTOMY;  Surgeon: Herbert Moors., MD;  Location: [facility];  Service: General;  Laterality: N/A;    JOINT REPLACEMENT      shoulder    JOINT REPLACEMENT      knee    OVARY SURGERY Left     Removal    REMOVAL, FOREIGN BODY N/A 05/27/2019    Procedure: REMOVAL, PERITONEAL DIALYSIS CATHETER;  Surgeon: Herbert Moors., MD;  Location:  [facility];  Service: General;  Laterality: N/A;    TUNNELED CATH CHECK/CHANGE (PERMCATH) N/A 07/03/2019    Procedure: TUNNELED CATH CHECK/CHANGE;  Surgeon: Maureen Ralphs, MD;  Location: [facility];  Service: Interventional Radiology;  Laterality: N/A;    TUNNELED CATH PLACEMENT (PERMCATH) Bilateral 05/23/2019    Procedure: TUNNELED CATH PLACEMENT;  Surgeon: Jacques Earthly, MD;  Location: [facility];  Service: Interventional Radiology;  Laterality: Bilateral;    TUNNELED CATH PLACEMENT (PERMCATH) Right 07/31/2019    Procedure: TUNNELED CATH PLACEMENT;  Surgeon: Jacques Earthly, MD;  Location: [facility];  Service: Interventional Radiology;  Laterality: Right;        X-Rays/Tests/Labs  Lab Results   Component Value Date/Time    HGB 8.7 (L) 01/06/2020 08:05 AM    HCT 29.0 (L) 01/06/2020 08:05 AM    K 4.7 01/06/2020 08:04 AM    NA 138 01/06/2020 08:04 AM    INR 1.1 07/01/2019 10:41 PM    TROPI 0.05 01/01/2020 05:26 AM    TROPI 0.05 01/01/2020 12:51 AM    TROPI 0.06 (H) 12/31/2019 08:12 PM    TROPI <0.01 07/26/2019 01:53 PM    TROPI 0.01 03/18/2006 06:10 PM       All imaging reviewed. Please see chart for details      Social History:  Prior Level of Function  Prior level of function: Needs assistance with ADLs,Ambulates with assistive device  Assistive Device: Single point cane (per pt report)  Baseline Activity Level: Household ambulation  DME Currently at Home: Environmental consultant, Front Wheel,Wheelchair, [place], Single Point,BP Cuff    Home Living Arrangements  Living Arrangements: Spouse/significant other  Type of Home: [place]: One level  Therapist, art:  (none)  DME Currently at Home: Walker, Front Wheel,Wheelchair, [place], Single Point,BP Cuff      Subjective:      Patient is agreeable to participation in the therapy session. Nursing clears patient for therapy.   Patient Goal: To go home  Pain Assessment  Pain Assessment: Numeric Scale (0-10)  Pain Score:  (does not quantify)  POSS Score:  Awake and Alert  Pain Location: Buttocks  Pain Descriptors: Burning  Pain Frequency:  (w/ toileting and mobility)  Pain Intervention(s): Medication (See eMAR) (RN aware)      Objective:      Observation of Patient/Vital Signs:BP 149/69    Pulse 69    Temp 97.8 F (36.6 C) (Oral)    Resp 15    Ht 1.575 m (5' 2.01")    Wt 98.3 kg (216 lb 11.4 oz)    LMP  (LMP Unknown) Comment: post menopausal   SpO2 97%    BMI 39.63 kg/m  Cognitive Status and Neuro Exam:  Cognition/Neuro Status  Arousal/Alertness: Appropriate responses to stimuli  Attention Span: Appears intact  Orientation Level: Oriented to place,Oriented to person  Memory: Appears intact  Following Commands: independent  Safety Awareness: minimal verbal instruction  Behavior: calm,cooperative  Motor Planning: intact    Musculoskeletal Examination  Gross ROM  Right Upper Extremity ROM: within functional limits  Left Upper Extremity ROM: within functional limits  Gross Strength  Right Upper Extremity Strength: within functional limits  Left Upper Extremity Strength:  (shoulder obs 3-/5)       Sensory/Oculomotor Examination  Sensory  Auditory: intact         Activities of Daily Living  Self-care and Home Management  Eating: Setup,Independent (ice chips)  Toileting: Moderate Assist,sitting  Functional Transfers: Contact Guard Assist,commode transfer (w/ RW)    Functional Mobility:  Mobility and Transfers  Scooting to EOB: Supervision  Supine to Sit: Stand by Assist,HOB raised,using bedrail  Sit to Supine: Stand by Assist  Sit to Stand: Contact Guard Assist  Functional Mobility Deferred (Comment):  (2/2 fatigue and pain)     Balance  Balance  Static Sitting Balance: good  Dyanamic Sitting Balance: good  Static Standing Balance: good (w/ AD)  Dynamic Standing Balance: fair (w/ AD)    Participation and Activity Tolerance  Participation and Endurance  Participation Effort: good    Educated the patient to role of occupational therapy, plan of care, goals of therapy  and safety with mobility and ADLs, discharge instructions with verbalized understanding.    Patient left in bed with alarm and all other medical equipment in place and call bell and all personal items/needs within reach.  RN notified of session outcome.        Assessment:  DEMARIE UHLIG is a 64 y.o. female admitted 12/31/2019. OT Assessment: decreased strength;balance deficits;decreased independence with ADLs;decreased independence with IADLs;decreased endurance/activity tolerance      Treatment:  -Encouragement for participation and education on benefits of mobility as  Tolerated, positional changes. Pt agreeable to OOB 2/2 urinary urgency.   -Good carry over instruction for sit>stand, transfer EOB<>BSC w/ RW. Assist for safety as pt with poor RW management in transfer back to EOB.   -Good carry over instruction for scooting to [facility] x2 trials this session with MOD A first trial, SBA second trial with bed in trendelenburg both trials.   -Discussed [place] rec, POC and encouraged cont OOB as tolerated with clin staff A. Pt verbalizes understanding.      Rehabilitation Potential: Prognosis: Good,With continued OT s/p acute discharge,With family    Plan:  OT Frequency Recommended: 1-2x/wk   Treatment Interventions: ADL retraining,Functional transfer training,UE strengthening/ROM,Endurance training,Patient/Family training,Equipment eval/education,Compensatory technique education     PMP Activity: Step 5 - Chair  Distance Walked (ft) (Step 6,7): 2 Feet    Risks/benefits/POC discussed    Goals:  Time For Goal Achievement: by time of discharge  ADL Goals  Patient will dress lower body: Minimal Assist,Not met  Patient will toilet: Minimal Assist,Not met  Mobility and Transfer Goals  Pt will transfer bed to toilet: Supervision,with rolling walker,Not met (or w/ [place])                           Barnetta Chapel, [place], OTR/L [phone]  01/06/2020 10:09 AM

## 2020-01-06 NOTE — Progress Notes (Signed)
INFECTIOUS DISEASES PROGRESS NOTE    Date Time: 01/06/20 11:39 AM  Patient Name: Uppal,Camreigh L  Patient status.Inpatient  Hospital Day: 6    Assessment and Plan:   1.  Covid  C-reactive protein 17.9  Ferritin 3158  2.  C. Difficile, failed oral vancomycin      Plan: 1 fidoxamicin and cholestyramine  Subjective:   Comfortable on room air    Review of Systems:   Review of Systems -3 bowel movements so far today after change of therapy    Antibiotics:   As above    Other medications reviewed in EPIC.  Central Access:       Physical Exam:     Vitals:    01/06/20 0837   BP: 149/69   Pulse: 69   Resp: 15   Temp: 97.8 F (36.6 C)   SpO2: 97%       Abdomen: No distention, bowel sounds heard no tenderness    Labs:     Microbiology Results (last 15 days)     Procedure Component Value Units Date/Time    Clostridium difficile toxin B PCR [[id number]]  (Abnormal) Collected: 01/01/20 2336    Order Status: Completed Specimen: Stool Updated: 01/03/20 0130     Stool Clostridium difficile Toxin B PCR Positive     Comment: Test performed using a BDMax PCR Assay. Due to the high  sensitivity of the assay, repeat testing is not recommended  and will be cancelled following [place] policy. Molecular  detection of C. difficile is not recommended as a test of cure  or for surveillance purposes. A positive test may indicate  infection or colonization with C. difficile. Testing will not  be performed on formed stool.         Narrative:      Patient's current admission began:->less than or equal to 3  days ago  Cancel C-Diff order if no diarrhea in 12 hours?->Yes    cdiff ok to process per dr Manson Allan    Stool Cryptosporidium / Giardia Antigen [[id number]] Collected: 01/01/20 2336    Order Status: Completed Specimen: Stool Updated: 01/02/20 0900    Narrative:      ORDER#: [id number]                                    ORDERED BY: Murlean Hark  SOURCE: Stool                                        COLLECTED:  01/01/20 23:36  ANTIBIOTICS AT COLL.:                                 RECEIVED :  01/02/20 05:24  Cryptosporidium/Giardia Antigen            FINAL       01/02/20 09:00  01/02/20   Negative for Cryptosporidium parvum antigen             Negative for Giardia lamblia antigen      Stool for Salm/Shig/Campy/Shiga PCR [[id number]] Collected: 01/01/20 2336    Order Status: Completed Specimen: Stool Updated: 01/02/20 1627     Stool Salmonella Species by PCR Negative     Stool Shigella Species/Enteroinvasive Escherichia coli PCR Negative     Stool  Campylobacter jejunii/coli by PCR Negative     Stool Shiga Toxin by PCR Negative     Comment: Testing performed using a BDMax multiplex PCR panel which  detects bacterial DNA. A positive result does not necessarily  indicate the presence of viable organisms. Positive results  do not rule out co-infection with other organisms that are  not detected by this test. This panel does not differentiate  between Shigella spp. and Enteroinvasive Escherichia coli  (EIEC), which are closely related and may cause similar  illness.  A result of Shiga Toxin POSITIVE, Shigella/EIEC NEGATIVE is  most likely due to the presence of Shiga toxin-producing  E. coli (STEC). Shigella species isolated in the [place]  are rarely positive for Shiga Toxin.  Serotyping of Salmonella, Shigella, or Shiga Toxin DNA  positive samples will be performed by [facility] for public health  surveillance purposes.  Serotyping results are available upon request.         COVID-19 (SARS-CoV-2) and Influenza A/B, NAA (Liat Rapid) [[id number]]  (Abnormal) Collected: 12/31/19 2054    Order Status: Completed Specimen: Culturette from Nasopharyngeal Updated: 12/31/19 2213     Purpose of COVID testing Diagnostic -PUI     SARS-CoV-2 Specimen Source Nasopharyngeal     SARS CoV 2 Overall Result Detected     Comment: Positive COVID results called to Dr.He.  Readback confirmed by [id number] at 22:13  12/31/2019  Test performed using the Roche cobas Liat SARS-CoV-2 assay. This  assay is  only for use under the Food and Drug Administrations Emergency Use  Authorization. This is a real-time RT-PCR assay for the qualitative  detection of SARS-CoV-2 RNA. Viral nucleic acids may persist in vivo,  independent of viability. Detection of viral nucleic acid does not imply the  presence of infectious virus, or that virus nucleic acid is the cause of  clinical symptoms. Negative results do not preclude SARS-CoV-2 infection and  should not be used as the sole basis for diagnosis, treatment or other  patient management decisions. Negative results must be combined with  clinical observations, patient history, and/or epidemiological information.  Invalid results may be due to inhibiting substances in the specimen and  recollection should occur. Please see Fact Sheets for patients and providers  located:  https://www.benson-chung.com/.          Influenza A Not Detected     Comment: Influenza A  NOT Detected results should be considered presumptive in  samples that have a positive SARS-CoV-2 (COVID-19) result. If co-infection  with influenza A virus is suspected and influenza virus detection would  change clinical management, please order influenza specific testing.          Influenza B Not Detected     Comment: Influenza B NOT Detected results should be considered presumptive in samples  that have a positive SARS-CoV-2 (COVID-19) result. If co-infection with  influenza B virus is suspected and influenza virus detection would change  clinical management, please order influenza specific testing.  Test performed using the Roche cobas Liat SARS-CoV-2 & Influenza A/B assay.  This assay is only for use under the Food and Drug Administrations  Emergency Use Authorization. This is a multiplex real-time RT-PCR assay  intended for the simultaneous in vitro qualitative detection and  differentiation of SARS-CoV-2, influenza A, and influenza B virus RNA. Viral  nucleic acids may persist in vivo,  independent of viability. Detection of  viral nucleic acid does not imply the presence of infectious virus, or that  virus nucleic acid  is the cause of clinical symptoms. Negative results do  not preclude SARS-CoV-2, influenza A, and/or influenza B infection and  should not be used as the sole basis for diagnosis, treatment or other  patient management decisions. Negative results must be combined with  clinical observations, patient history, and/or epidemiological information.  Invalid results may be due to inhibiting substances in the specimen and  recollection should occur. Please see Fact Sheets for patients and providers  located: http://olson-hall.info/.         Narrative:      o Collect and clearly label specimen type:  o PREFERRED-Upper respiratory specimen: One Nasopharyngeal  Swab in Transport Media.  o Hand deliver to laboratory ASAP  Diagnostic -PUI    Blood Culture #1 [[id number]] Collected: 12/31/19 2012    Order Status: Completed Specimen: Arm from Blood, Venipuncture Updated: 01/06/20 0521    Narrative:      ORDER#: [id number]                                    ORDERED BY: HE, ALBERT  SOURCE: Blood, Venipuncture Arm                      COLLECTED:  12/31/19 20:12  ANTIBIOTICS AT COLL.:                                RECEIVED :  01/01/20 02:23  Culture Blood Aerobic and Anaerobic        FINAL       01/06/20 05:21  01/06/20   No growth after 5 days of incubation.      Blood Culture #2 [[id number]] Collected: 12/31/19 2012    Order Status: Completed Specimen: Arm from Blood, Venipuncture Updated: 01/06/20 0521    Narrative:      ORDER#: [id number]                                    ORDERED BY: HE, ALBERT  SOURCE: Blood, Venipuncture Arm                      COLLECTED:  12/31/19 20:12  ANTIBIOTICS AT COLL.:                                RECEIVED :  01/01/20 02:23  Culture Blood Aerobic and Anaerobic        FINAL       01/06/20 05:21  01/06/20   No growth after 5 days of incubation.             CBC w/Diff CMP   Recent Labs   Lab 01/06/20  0805 01/05/20  1349 01/05/20  0546 01/01/20  0526 12/31/19  2012   WBC 6.15 8.26 5.49  More results in Results Review 5.41   Hgb 8.7* 8.3* 8.7*  More results in Results Review 9.0*   Hematocrit 29.0* 28.2* 29.4*  More results in Results Review 29.9*   Platelets 208 239 267  More results in Results Review 174   MCV 96.0 96.2* 97.0*  More results in Results Review 95.5   Neutrophils  --   --   --   --  78.5   Segmented  Neutrophils  --  79  --   --   --    More results in Results Review = values in this interval not displayed.       PT/INR         Recent Labs   Lab 01/06/20  0804 01/05/20  1349 01/05/20  0546 01/03/20  0430 01/02/20  0411 01/01/20  0051 12/31/19  2012   Sodium 138 135* 136  More results in Results Review 140  More results in Results Review 136   Potassium 4.7 5.4* 5.2*  More results in Results Review 4.1  More results in Results Review 3.7   Chloride 101 100 100  More results in Results Review 99*  More results in Results Review 92*   CO2 26 20* 21*  More results in Results Review 27  More results in Results Review 25   BUN 46.0* 71.0* 61.0*  More results in Results Review 55.0*  More results in Results Review 33.0*   Creatinine 7.4* 9.7* 9.1*  More results in Results Review 7.7*  More results in Results Review 5.4*   Glucose 201* 270* 189*  More results in Results Review 208*  More results in Results Review 265*   Calcium 8.3* 8.4* 8.4*  More results in Results Review 7.7*  More results in Results Review 7.9*   Phosphorus  --   --   --   --  5.5*  --   --    Protein, Total  --  6.7  --   --   --   --  7.3   Albumin  --  2.7*  --   --   --   --  2.7*   AST (SGOT)  --  9  --   --   --   --  20   ALT  --  8  --   --   --   --  10   Alkaline Phosphatase  --  76  --   --   --   --  121*   Bilirubin, Total  --  0.2  --   --   --   --  0.4   More results in Results Review = values in this interval not displayed.      Glucose POCT   Recent Labs   Lab  01/06/20  0804 01/05/20  1349 01/05/20  0546 01/04/20  0353 01/03/20  0430 01/02/20  0411 01/01/20  0051   Glucose 201* 270* 189* 245* 140* 208* 244*          Rads:     Radiology Results (24 Hour)     ** No results found for the last 24 hours. **            Signed by: Ernst Breach, MD

## 2020-01-07 ENCOUNTER — Encounter (INDEPENDENT_AMBULATORY_CARE_PROVIDER_SITE_OTHER): Payer: Self-pay

## 2020-01-07 LAB — BASIC METABOLIC PANEL
Anion Gap: 15 (ref 5.0–15.0)
BUN: 55 mg/dL — ABNORMAL HIGH (ref 7.0–19.0)
CO2: 21 mEq/L — ABNORMAL LOW (ref 22–29)
Calcium: 8.9 mg/dL (ref 8.5–10.5)
Chloride: 103 mEq/L (ref 100–111)
Creatinine: 8.5 mg/dL — ABNORMAL HIGH (ref 0.6–1.0)
Glucose: 105 mg/dL — ABNORMAL HIGH (ref 70–100)
Potassium: 4.6 mEq/L (ref 3.5–5.1)
Sodium: 139 mEq/L (ref 136–145)

## 2020-01-07 LAB — CBC
Absolute NRBC: 0.02 10*3/uL — ABNORMAL HIGH (ref 0.00–0.00)
Hematocrit: 32.5 % — ABNORMAL LOW (ref 34.7–43.7)
Hgb: 9.7 g/dL — ABNORMAL LOW (ref 11.4–14.8)
MCH: 28.8 pg (ref 25.1–33.5)
MCHC: 29.8 g/dL — ABNORMAL LOW (ref 31.5–35.8)
MCV: 96.4 fL — ABNORMAL HIGH (ref 78.0–96.0)
MPV: 10.5 fL (ref 8.9–12.5)
Nucleated RBC: 0.3 /100 WBC — ABNORMAL HIGH (ref 0.0–0.0)
Platelets: 228 10*3/uL (ref 142–346)
RBC: 3.37 10*6/uL — ABNORMAL LOW (ref 3.90–5.10)
RDW: 15 % (ref 11–15)
WBC: 7.18 10*3/uL (ref 3.10–9.50)

## 2020-01-07 LAB — GLUCOSE WHOLE BLOOD - POCT
Whole Blood Glucose POCT: 100 mg/dL (ref 70–100)
Whole Blood Glucose POCT: 183 mg/dL — ABNORMAL HIGH (ref 70–100)
Whole Blood Glucose POCT: 376 mg/dL — ABNORMAL HIGH (ref 70–100)

## 2020-01-07 LAB — GFR: EGFR: 5.7

## 2020-01-07 LAB — HEMOLYSIS INDEX: Hemolysis Index: 2 (ref 0–18)

## 2020-01-07 MED ORDER — METHYLPREDNISOLONE 4 MG PO TBPK
ORAL_TABLET | ORAL | 0 refills | Status: DC
Start: 2020-01-07 — End: 2020-01-22

## 2020-01-07 MED ORDER — AMLODIPINE BESYLATE 5 MG PO TABS
5.0000 mg | ORAL_TABLET | Freq: Once | ORAL | Status: AC
Start: 2020-01-07 — End: 2020-01-07
  Administered 2020-01-07: 12:00:00 5 mg via ORAL
  Filled 2020-01-07: qty 1

## 2020-01-07 MED ORDER — AMLODIPINE BESYLATE 5 MG PO TABS
10.0000 mg | ORAL_TABLET | Freq: Every day | ORAL | Status: DC
Start: 2020-01-08 — End: 2020-01-07

## 2020-01-07 MED ORDER — SODIUM CHLORIDE 0.9 % IV BOLUS
100.0000 mL | INTRAVENOUS | Status: AC | PRN
Start: 2020-01-07 — End: 2020-01-07

## 2020-01-07 MED ORDER — AMLODIPINE BESYLATE 10 MG PO TABS
10.0000 mg | ORAL_TABLET | Freq: Every day | ORAL | 0 refills | Status: DC
Start: 2020-01-07 — End: 2021-02-04

## 2020-01-07 MED ORDER — SODIUM CHLORIDE 0.9 % IV BOLUS
250.0000 mL | INTRAVENOUS | Status: AC | PRN
Start: 2020-01-07 — End: 2020-01-07

## 2020-01-07 MED ORDER — RISAQUAD PO CAPS
1.0000 | ORAL_CAPSULE | Freq: Every day | ORAL | 0 refills | Status: DC
Start: 2020-01-07 — End: 2020-01-22

## 2020-01-07 MED ORDER — PREDNISONE 20 MG PO TABS
40.0000 mg | ORAL_TABLET | Freq: Every morning | ORAL | Status: DC
Start: 2020-01-07 — End: 2020-01-08
  Administered 2020-01-07 – 2020-01-08 (×2): 40 mg via ORAL
  Filled 2020-01-07 (×2): qty 2

## 2020-01-07 NOTE — Progress Notes (Signed)
[facility] PROGRESS NOTE  Aaron Edelman, x [phone] [facility] Spectralink)      Date Time: 01/07/20 12:16 PM  Patient Name: [name]  Attending Physician: Neysa Bonito, MD    CC: follow-up esrd    Assessment:   1. ESRD on HD MWF at [facility]  2. Covid 19 pneumonia - on RA  3. Anemia in CKD  4. HTN  5. C diff      Recommendations:     1. HD today as ordered. UF 3 liters as tolerated.  2. Stable for d/c from a renal standpoint.    Case discussed with: HD RN    Ulyses Amor, MD  [facility]  [phone] (office)  X [phone] [facility] Spectra-Link)      Subjective: remains on room air. No complaints    Review of Systems:   Deferred today; per chart review, persistent diarrhea    Physical Exam:     Vitals:    01/07/20 0427 01/07/20 0657 01/07/20 0942 01/07/20 1159   BP: 181/81 188/89 177/79 131/58   Pulse: 67 70     Resp: 17 18     Temp: 97.4 F (36.3 C) 97.6 F (36.4 C)     TempSrc: Axillary Oral     SpO2: 100% 99%     Weight: 98.4 kg (217 lb)      Height:           Intake and Output Summary (Last 24 hours) at Date Time    Intake/Output Summary (Last 24 hours) at 01/07/2020 1216  Last data filed at 01/07/2020 0800  Gross per 24 hour   Intake 100 ml   Output    Net 100 ml       Limiting exposure and conserving PPE during time of pandemic, as per CDC guidelines and [place] hospital policy.            Meds:      Scheduled Meds: PRN Meds:    atorvastatin, 40 mg, Oral, Daily  cholestyramine, 2 packet, Oral, BID  darbepoetin alfa, 60 mcg, Subcutaneous, Weekly  fidaxomicin, 200 mg, Oral, BID  heparin (porcine), 5,000 Units, Subcutaneous, Q12H SCH  insulin glargine, 20 Units, Subcutaneous, QHS  insulin lispro, 1-4 Units, Subcutaneous, QHS  insulin lispro, 1-8 Units, Subcutaneous, TID AC  lidocaine, , Topical, QID  miconazole 2 % with zinc oxide, , Topical, Q6H  pantoprazole, 40 mg, Oral, QAM AC  predniSONE, 40 mg, Oral, QAM W/BREAKFAST  sevelamer, 800 mg, Oral, TID MEALS  vitamin C, 500 mg,  Oral, Daily          Continuous Infusions:   acetaminophen, 650 mg, Q6H PRN   Or  acetaminophen, 650 mg, Q6H PRN  acetaminophen, 650 mg, Once PRN  acetaminophen, 650 mg, Q6H PRN  bisacodyl, 5 mg, Daily PRN  dextrose, 15 g of glucose, PRN   And  dextrose, 12.5 g, PRN   And  glucagon (rDNA), 1 mg, PRN  guaiFENesin-codeine, 5 mL, Q4H PRN  hydrALAZINE, 25 mg, Q8H PRN  lidocaine, , 4X Daily PRN  melatonin, 3 mg, QHS PRN  midodrine, 10 mg, TID PRN  naloxone, 0.2 mg, PRN  ondansetron, 4 mg, Q6H PRN  zinc Oxide, , PRN              Labs:     Recent Labs   Lab 01/07/20  0649 01/06/20  0805 01/05/20  1349   WBC 7.18 6.15 8.26   Hgb 9.7* 8.7* 8.3*  Hematocrit 32.5* 29.0* 28.2*   Platelets 228 208 239     Recent Labs   Lab 01/07/20  0649 01/06/20  0804 01/05/20  1349 01/03/20  0430 01/02/20  0411 01/01/20  0051 12/31/19  2012   Sodium 139 138 135*  More results in Results Review 140  More results in Results Review 136   Potassium 4.6 4.7 5.4*  More results in Results Review 4.1  More results in Results Review 3.7   Chloride 103 101 100  More results in Results Review 99*  More results in Results Review 92*   CO2 21* 26 20*  More results in Results Review 27  More results in Results Review 25   BUN 55.0* 46.0* 71.0*  More results in Results Review 55.0*  More results in Results Review 33.0*   Creatinine 8.5* 7.4* 9.7*  More results in Results Review 7.7*  More results in Results Review 5.4*   Calcium 8.9 8.3* 8.4*  More results in Results Review 7.7*  More results in Results Review 7.9*   Albumin  --   --  2.7*  --   --   --  2.7*   Phosphorus  --   --   --   --  5.5*  --   --    Glucose 105* 201* 270*  More results in Results Review 208*  More results in Results Review 265*   EGFR 5.7 6.7 4.9  More results in Results Review 6.4  More results in Results Review 9.6   More results in Results Review = values in this interval not displayed.           Signed by: Ulyses Amor, MD

## 2020-01-07 NOTE — Progress Notes (Signed)
Patient received resting in bed. Patient denies any pain. Pt. does not have IV access. Rounding done. Patient safety measures maintained.    D/C instructions reviewed with pt., Pt. Expressed understanding.     Pt. Unable to be D/C because she needs a dialysis facility that accepts Covid positive.

## 2020-01-07 NOTE — Progress Notes (Signed)
Home Health Referral      Referral from Mitzi Hansen (Case Manager) for home health care upon discharge.    By [employer], the patient has the right to freely choose a home care provider.    Arrangements have been made with:     A company of the patients choosing. We have supplied the patient with a listing of providers in your area who asked to be included and participate in Medicare.    [facility], a home care agency that provides adult home care services and participates in Medicare    The preferred provider of your insurance company. Choosing a home care provider other than your insurance company's preferred provider may affect your insurance coverage.      Home Health Discharge Information    Your doctor has ordered Skilled Nursing, Physical Therapy and Occupational Therapy in-home service(s) for you while you recuperate at home, to assist you in the transition from hospital to home.    The agency that you or your representative chose to provide the service:  [facility], [phone]    IF YOU HAVE NOT HEARD [facility] 24-48 HOURS AFTER DISCHARGE PLEASE CALL YOUR AGENCY TO ARRANGE A TIME FOR YOUR FIRST VISIT. FOR ANY SCHEDULING CONCERNS OR QUESTIONS RELATED TO HOME HEALTH, SUCH AS TIME OR DATE PLEASE CONTACT YOUR HOME HEALTH AGENCY AT THE NUMBER LISTED ABOVE.    The above services were set up by:  Diane Genta, [name] [facility] Liaison) Phone [phone]      [facility] REFERRAL    PATIENT DEMOGRAPHICS:        Name: [name]    Discharge Address: [address] [place] [address]      Primary Telephone Number:  [phone] patient cell  Secondary Telephone Number: [phone] daughter [name]  Emergency Contact and Number: Extended Emergency Contact Information  Primary Emergency Contact: [name],[name]  Mobile Phone: [phone]  Relation: Daughter  Interpreter needed? No        Ordering  Physician: Neysa Bonito, MD    PCP: PCP None, MD, None    Following Physician: Dr. Arnetha Massy TCM clinic     Language/Communication Barrier:       No    Primary Diagnosis and Reason for Services:     Diarrhea    Pneumonia due to COVID-19 virus         [employer] (Labs, Wounds, Infusions, etc.):      Additional Comments:  Contact special and airborne isolation: covid Positive, C-diff, MRSA, MDRO    COVID-19 Screening:    COVID-19: 12/31/19   positive      If positive or result pending, is patient agreeable to wearing PPE during visit? Yes       Discharge Date: 01/08/20  Referral Source (PACC/Hospital/Unit): Diane Genta, [name]  Referral Date: 01/07/20      Home Health face-to-face (FTF) Encounter (Order [id number])  Consult  Date: 01/07/2020 Department: [facility] [address] [facility] Ordering/Authorizing: Neysa Bonito, MD       Order Information    Order Date/Time Release Date/Time Start Date/Time End Date/Time   01/07/20 02:26 PM None 01/07/20 02:24 PM 01/07/20 02:24 PM     Order Details    Frequency Duration Priority Order Class   Once 1 occurrence Routine Hospital Performed     Standing Order Information    Remaining Occurrences Interval Last  Released     0/1 Once 01/07/2020             Provider Information    Ordering User Ordering Provider Authorizing Provider   Diane Genta, [name] Winfred, Carolan Clines, MD Neysa Bonito, MD   Attending Provider(s) Admitting Provider PCP   He, Devin Going, MD PhD; Saverio Danker, MD; Murlean Hark, MD; Neysa Bonito, MD Saverio Danker, MD Pcp, None, MD     Verbal Order Info    Action Created on Order Mode Entered by Responsible Provider Signed by Signed on   Ordering 01/07/20 1426 Telephone with Diane Borders, [name] Winfred, Carolan Clines, MD       Comments    Dr. Shirlean Schlein, TCM clinic to follow in the community     Skin care: sacrum, perineal, buttocks, gluteal cleft, perianal, groin   1. Clean affected area with  wound cleanser/peri-wipes every 6 hours and PRN.   2. Apply thin layer Baza (miconazole) cream every 6 hours and as needed with each soilage episode.   3. Leave open to the air.   4. Please do not remove all of the Baza cream with each treatment; leave a thin white layer and apply more cream as needed.     Baza Antifungal (miconazole nitrate 2%) is a moisture barrier cream that inhibits fungal growth and treats candidiasis, jock itch, ringworm, and athlete's foot - also provides a moisture barrier against urine and feces. It has skin conditioners and is CHG compatible.     Diagnosis    Diarrhea    Pneumonia due to COVID-19 virus    History of CVA (cerebrovascular accident)    History of COPD    Hyperlipidemia    Hypertension    GERD (gastroesophageal reflux disease)    Type 2 diabetes mellitus, with long-term current use of insulin    h/o orthostatic hypotension    ESRD (end stage renal disease) on dialysis    Iron deficiency anemia secondary to inadequate dietary iron intake       Home nursing required for skilled assessment including cardiopulmonary assessment and dietary education for disease management, and medication instruction. Home PT/OT required for gait and balance training, strengthening, mobility, fall prevention, and ADL training.           Home Health face-to-face (FTF) Encounter: Patient Communication    Not Released Not seen     Order Questions    Question Answer   Date I saw the patient face-to-face: 01/07/2020   Evidence this patient is homebound because: C. Decreased endurance, strength, ROM, cadence, safety/judgment during mobility    E. Requires assistance of another person or assistive device to ambulate > 5 feet    G. Fall risk due to impaired coordination, gait and decreased balance    N. Impaired mobility d/t pain, arthritis, weakness that compromises patient safety   Medical conditions that necessitate Home Health care: B. Functional impairment due to recent  hospitalization/procedure/treatment    C. Risk for complication/infection/pain requiring follow up and monitoring    D. Chronic illness & risk for re-hospitalization due to unstable disease status    F. New diagnosis & treatment requiring follow up monitoring and management   Per clinical findings, following services are medically necessary: Skilled Nursing    PT    OT   Clinical findings that support the need for Skilled Nursing. SN will: C. Monitor for signs and symptoms of exacerbation of disease and management    D.  Review medication reconciliation, manage and educate on use and side effects    G. Educate on new diagnosis, treatment & management to prevent re-hospitalization    H. Assess cardiopulmonary status and monitor for signs &symptoms of exacerbation    I. Educate dietary and or fluid restrictions and weight management   Clinical findings that support the need for Physical Therapy. PT will A. Evaluate and treat functional impairment and improve mobility    C. Educate on weight bearing status, stair/gait training, balance & coordination    D. Provide services to help restore function, mobility, and releive pain    E. Educate on functional mobility; bed, chair, sit, stand and transfer activities    F. Perform home safety assessment & develop safe in home exercise program    G. Implement activities to improve stance time, cadence & step length    H. Educate on the safe use of assistive device/ durable medical equipment    I. Instruct on restorative activities to restore ability to perform ADL   OT will provide assistance with: Home program to improve ability to perform ADLs    Recovery and maintenance skills    Basic motor function and reasoning abilities    Strategies to compensate for loss of function    Restorative program to improve mobility and independence           Process Instructions    Please select Home Care Services medically necessary.     Based on the above findings, I certify that  this patient is confined to the home and needs intermittent skilled nursing care, physical therapry and / or speech therapy or continues to need occupational therapy. The patient is under my care, and I have initiated the establishment of the plan of care. This patient will be followed by a physician who will periodically review the plan of care.      Collection Information        Consult Order Info    ID Description Priority Start Date Start Time   [id number] [facility] face-to-face (FTF) Encounter Routine 01/07/2020 2:24 PM   Provider Specialty Referred to   ______________________________________ _____________________________________       Verbal Order Info    Action Created on Order Mode Entered by Responsible Provider Signed by Signed on   Ordering 01/07/20 1426 Telephone with Diane Borders, [name] Winfred, Carolan Clines, MD       Patient Information    Patient Name   [name], [name] Legal Sex   Female DOB   1955/08/05     Additional Information    Associated Reports External References   Priority and Order Details [facility]        Patient Name: [name], [name]     MRN: [id number]     CSN: [id number]       [place] #   [id number] Patient Class   Inpatient Service  Medicine Accommodation Code  Intermediate Care     Admission Information    Admitting Physician:  Attending Physician: Saverio Danker, MD  Neysa Bonito, MD Unit  [address] INTERMED * L&D Status     Admitting Diagnosis: SOB (shortness of breath); ESRD (end stage renal disease) on dialysis; Ele* Room / Bed  M1470/L2957-M L&D  Last Menstrual Cycle     Chief Complaint: Shortness of Breath     Admit  Type:  Admit Date/Time:  Discharge Date/Time: Emergency  12/31/2019 / 1941   /  Length of Stay: 7 Days   L&D EDD   Estimated Date of Delivery: None noted.     Patient Information            Home Address: [place] [address]  Employer:  Employer Address:     ,     Main Phone: [phone] Employer Phone:    SSN: [id number]     DOB: 1955-11-14 (53 yrs)     Sex: Female Primary Care Physician: Pcp, None, MD   Marital Status: Legally Separated Referring Physician:       No ref. provider found   Race: Black or African American     Ethnicity: Non Hispanic/Latino     Emergency Contacts  Name Home Phone Work Phone Mobile Phone Relationship Lgl Leafy Half   [phone] Daughter No        Guarantor Information    Guarantor Name: [name], [name] Guarantor ID: [id number]   Guarantor Relationship to Pt: Self Guarantor Type: Personal/Family   Guarantor DOB:   28-Mar-1955     Guarantor Address: 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 Emp Phone:               [place] Name: Lesia Sago [place] Name: [place] Address:   [place], [place] Subscriber DOB: September 14, 1955     Subscriber ID: [id number]   Insurance Phone: [phone] Pt Relationship to Sub:   Self   Insurance ID:      Group Name:  Preauthorization #: [id number]   Group #: [id number] Preauthorization Days:      Secondary Insurance    Insurance Name: - Subscriber Name:    Insurance underwriter Address:     ,   Veterinary surgeon DOB:      Subscriber ID:    Veterinary surgeon:  Pt Relationship to Sub:      Insurance ID:      Group Name:  Fish farm manager #:    Group #:  Preauthorization Days:      [facility] Name: Radiation protection practitioner Name:    Insurance underwriter Address:     ,   Veterinary surgeon DOB:      Subscriber ID:    Veterinary surgeon:  Pt Relationship to Sub:      Insurance ID:      Group Name:  Preauthorization #:    Group #:  Preauthorization Days:        01/07/2020 2:42 PM

## 2020-01-07 NOTE — Discharge Summary (Addendum)
[facility]      Patient: [name]  Admission Date: 12/31/2019   DOB: 12-Dec-1955  Discharge Date: 01/07/2020    MRN: [id number]  Discharge Attending:Lucee Brissett Velora Heckler, MD     Referring Physician: Pcp, None, MD  PCP: Pcp, None, MD       DISCHARGE SUMMARY     Discharge Information   Admission Diagnosis:   Pneumonia due to COVID-19 virus    Discharge Diagnosis:   Active Hospital Problems    Diagnosis    Diarrhea    Pneumonia due to COVID-19 virus    History of CVA (cerebrovascular accident)    History of COPD    Hyperlipidemia    Hypertension    GERD (gastroesophageal reflux disease)    Type 2 diabetes mellitus, with long-term current use of insulin    h/o orthostatic hypotension    ESRD (end stage renal disease) on dialysis    Iron deficiency anemia secondary to inadequate dietary iron intake        Admission Condition: unstable, intractable diarrhea, poor po intake   Discharge Condition: fair   Consultants: Infectious Disease- Dr. Lahoma Crocker   Functional Status: ambulatory   Discharged to: home    Discharge Medications:     Medication List      START taking these medications    amLODIPine 10 MG tablet  Commonly known as: NORVASC  Take 1 tablet (10 mg total) by mouth daily     cholestyramine 4 g packet  Commonly known as: QUESTRAN  Take 2 packets by mouth 2 (two) times daily     fidaxomicin 200 MG Tabs  Commonly known as: DIFICID  Take 1 tablet (200 mg total) by mouth 2 (two) times daily for 10 days     insulin glargine 100 UNIT/ML injection  Commonly known as: LANTUS  Inject 20 Units into the skin nightly     lactobacillus/streptococcus Caps  Take 1 capsule by mouth daily     Lidocaine 2 % Gel  Apply 1 application topically 4 (four) times daily To anal / buttock area  Replaces: lidocaine 5 %     methylPREDNISolone 4 MG tablet  Commonly known as: MEDROL DOSPACK  follow package directions     zinc Oxide 40 % Pste paste  Commonly known as: DESITIN  Apply topically as needed for Dry Skin         CHANGE how you take these medications    midodrine 10 MG tablet  Commonly known as: PROAMATINE  Take 1 tablet (10 mg total) by mouth 3 (three) times daily as needed (SBP < 110 or DBP < 70)  What changed:    when to take this   reasons to take this        CONTINUE taking these medications    acetaminophen 325 MG tablet  Commonly known as: TYLENOL  Take 2 tablets (650 mg total) by mouth every 6 (six) hours as needed for Pain or Fever     atorvastatin 40 MG tablet  Commonly known as: LIPITOR     bisacodyl 5 MG EC tablet  Commonly known as: DULCOLAX  Take 1 tablet (5 mg total) by mouth daily as needed for Constipation     darbepoetin alfa 60 MCG/0.3ML injection  Commonly known as: ARANESP  Inject 0.3 mLs (60 mcg total) into the skin once a week     melatonin 3 mg tablet  Take 1 tablet (3 mg total) by mouth nightly as needed for Sleep  nystatin powder  Commonly known as: NYSTOP  Apply 1 application topically 2 (two) times daily     ondansetron 4 MG disintegrating tablet  Commonly known as: ZOFRAN-ODT  Take 1 tablet (4 mg total) by mouth every 6 (six) hours as needed for Nausea     pantoprazole 40 MG tablet  Commonly known as: PROTONIX  Take 1 tablet (40 mg total) by mouth 2 (two) times daily     sevelamer 800 MG tablet  Commonly known as: RENVELA     Trulicity 0.09 QZ/3.0QT Sopn  Generic drug: Dulaglutide     vitamin C 500 MG tablet  Commonly known as: ASCORBIC ACID  Take 1 tablet (500 mg total) by mouth daily     zinc sulfate 220 (50 Zn) MG capsule  Commonly known as: ZINCATE  Take 1 capsule (220 mg total) by mouth daily        STOP taking these medications    colchicine 0.6 MG tablet     collagenase ointment  Commonly known as: SANTYL     famotidine 40 MG tablet  Commonly known as: PEPCID     fludrocortisone 0.1 MG tablet  Commonly known as: FLORINEF     insulin aspart 100 UNIT/ML injection  Commonly known as: NovoLOG     lidocaine 5 %  Commonly known as: LIDODERM  Replaced by: Lidocaine 2 % Gel     senna 8.6 MG  Tabs  Commonly known as: SENOKOT     sodium hypochlorite 0.125 % Soln  Commonly known as: DAKIN'S QUARTER STRENGTH           Where to Get Your Medications      These medications were sent to [facility], [address], [place] [address]    Phone: [phone]    amLODIPine 10 MG tablet   cholestyramine 4 g packet   fidaxomicin 200 MG Tabs   insulin glargine 100 UNIT/ML injection   lactobacillus/streptococcus Caps   Lidocaine 2 % Gel   methylPREDNISolone 4 MG tablet   zinc Oxide 40 % Pste paste     Information about where to get these medications is not yet available    Ask your nurse or doctor about these medications   midodrine 10 MG tablet             Hospital Course   Presentation History     Shortness of breath, secondary to COVID 19 PNA    See HPI for details.    Hospital Course (7 Days)     [name] is a 63 y.o. female admitted with Pneumonia due to COVID-19 virus with acute hypoxic respiratory failure requiring supplemental oxygen.  Patient was started on steroids and was able to be titrated off supplemental oxygen.  Patient developed diarrhea and was diagnosed with C. difficile December 9.  Intractable diarrhea with poor p.o. intake.  Patient failed oral vancomycin therapy.  Infectious disease was consulted and patient improved with cholestyramine and dificid. Patient < 3BM/ 24 hours at time of discharge. Tolerating po intake.  Patient discharged home on December 15.    Acute hypoxic respiratory failure, secondary to COVID 19 PNA on admission improved  Pneumonia,secondary to COVID 19 virus, on admission  H/o COPD  -No h/o chronic respiratory failure, patient weaned off supplemental oxygen   -steroids: transition from dexamethasone to medrol pack on discharge  -Does not meet criteria for remdesivir  -  Encourage IS use    Diarrhea, C diff +  -On admission, > 12 watery BMs/ 24 hours. Shigella/Salmonella/Campylobacter  tests are negative  -No BM since last night  -ID Dr. Lahoma Crocker consulted.   -failed po vanc and flagyl  -on fidoxamicin and cholestyramine (started 12/13-12/23) to complete a 10-day course  -Lidocaine gel and desitin for peri-anal irritation     Iron Deficiency Anemia  -on Aranesp per nephrology    ESRD on HD, MWF. Nephrology consulted and following    DM2, on insulin. HbA1c 10.2  -continue lantus 20units QHS   -needs to follow-up with PCP with [facility] log post discharge for further management of DM    HTN, chronic  -on prn midodrine  -started amlodipine while inpatient and titrated up to 10 mg daily (01/06/20)  -stopped fludrocortisone after discussion with nephrology   -Patient to follow-up with primary care physician for further evaluation and management of hypertension and antihypertensive medication adjustment    Dyslipidemia, chronic, continue lipitor    H/o CVA, no acute deficits    Morbid Obesity/ Obesity- a BMI over 30 with a chronic condition (DM,COPD, HTN, ESRD)  is clinically significant        Procedures/Imaging:   Chest AP Portable    Result Date: 12/31/2019  Bilateral hazy and consolidation throughout the peripheries of the midlungs and lung bases which represent atypical/viral pneumonia, or asymmetric pulmonary edema. Raphael Gibney, DO  12/31/2019 8:02 PM           Best Practices   Was the patient admitted with either a CHF Exacerbation or Pneumonia? COVID 19 PNA     Progress Note/Physical Exam at Discharge     Subjective: Events overnight.  Patient denies any abdominal pain or nausea or vomiting.  Tolerating diet well.  Patient had a 1 semiformed bowel movement overnight.  Patient's anal pain controllable.  Patient feels safe to go home, tolerating p.o. intake.    Vitals:    01/06/20 2319 01/07/20 0427 01/07/20 0657 01/07/20 0942   BP: 160/69 181/81 188/89 177/79   Pulse: 68 67 70    Resp: 18 17 18     Temp: 97.6 F (36.4 C) 97.4 F (36.3 C) 97.6 F (36.4 C)    TempSrc: Axillary Axillary Oral     SpO2: 96% 100% 99%    Weight:  98.4 kg (217 lb)     Height:             General: NAD, AAOx3  HEENT: eomi, sclera anicteric, OP: Clear, MMM  Neck: supple, FROM, no LAD  Cardiovascular: RRR, no m/r/g  Lungs: CTAB, no w/r/r  Abdomen: soft, +BS, NT/ND, no masses, no g/r  Extremities: no C/C/E  Skin: no rashes or lesions noted  Neuro: CN 2-12 intact; No Focal neurological deficits       Diagnostics     Labs/Studies Pending at Discharge: No    Last Labs   Recent Labs   Lab 01/07/20  0649 01/06/20  0805 01/05/20  1349   WBC 7.18 6.15 8.26   RBC 3.37* 3.02* 2.93*   Hgb 9.7* 8.7* 8.3*   Hematocrit 32.5* 29.0* 28.2*   MCV 96.4* 96.0 96.2*   Platelets 228 208 239       Recent Labs   Lab 01/07/20  0649 01/06/20  0804 01/05/20  1349 01/05/20  0546 01/04/20  0353   Sodium 139 138 135* 136 138   Potassium 4.6 4.7 5.4* 5.2* 5.1   Chloride 103 101 100 100  100   CO2 21* 26 20* 21* 20*   BUN 55.0* 46.0* 71.0* 61.0* 52.0*   Creatinine 8.5* 7.4* 9.7* 9.1* 7.6*   Glucose 105* 201* 270* 189* 245*   Calcium 8.9 8.3* 8.4* 8.4* 8.6       Microbiology Results (last 15 days)     Procedure Component Value Units Date/Time    Clostridium difficile toxin B PCR [[id number]]  (Abnormal) Collected: 01/01/20 2336    Order Status: Completed Specimen: Stool Updated: 01/03/20 0130     Stool Clostridium difficile Toxin B PCR Positive     Comment: Test performed using a BDMax PCR Assay. Due to the high  sensitivity of the assay, repeat testing is not recommended  and will be cancelled following [place] policy. Molecular  detection of C. difficile is not recommended as a test of cure  or for surveillance purposes. A positive test may indicate  infection or colonization with C. difficile. Testing will not  be performed on formed stool.         Narrative:      Patient's current admission began:->less than or equal to 3  days ago  Cancel C-Diff order if no diarrhea in 12 hours?->Yes    cdiff ok to process per dr Manson Allan    Stool Cryptosporidium / Giardia Antigen  [[id number]] Collected: 01/01/20 2336    Order Status: Completed Specimen: Stool Updated: 01/02/20 0900    Narrative:      ORDER#: [id number]                                    ORDERED BY: Murlean Hark  SOURCE: Stool                                        COLLECTED:  01/01/20 23:36  ANTIBIOTICS AT COLL.:                                RECEIVED :  01/02/20 05:24  Cryptosporidium/Giardia Antigen            FINAL       01/02/20 09:00  01/02/20   Negative for Cryptosporidium parvum antigen             Negative for Giardia lamblia antigen      Stool for Salm/Shig/Campy/Shiga PCR [[id number]] Collected: 01/01/20 2336    Order Status: Completed Specimen: Stool Updated: 01/02/20 1627     Stool Salmonella Species by PCR Negative     Stool Shigella Species/Enteroinvasive Escherichia coli PCR Negative     Stool Campylobacter jejunii/coli by PCR Negative     Stool Shiga Toxin by PCR Negative     Comment: Testing performed using a BDMax multiplex PCR panel which  detects bacterial DNA. A positive result does not necessarily  indicate the presence of viable organisms. Positive results  do not rule out co-infection with other organisms that are  not detected by this test. This panel does not differentiate  between Shigella spp. and Enteroinvasive Escherichia coli  (EIEC), which are closely related and may cause similar  illness.  A result of Shiga Toxin POSITIVE, Shigella/EIEC NEGATIVE is  most likely due to the presence of Shiga toxin-producing  E. coli (STEC). Shigella species isolated in the [place]  are rarely  positive for Shiga Toxin.  Serotyping of Salmonella, Shigella, or Shiga Toxin DNA  positive samples will be performed by [facility] for public health  surveillance purposes.  Serotyping results are available upon request.         COVID-19 (SARS-CoV-2) and Influenza A/B, NAA (Liat Rapid) [[id number]]  (Abnormal) Collected: 12/31/19 2054    Order Status: Completed Specimen: Culturette from Nasopharyngeal Updated: 12/31/19  2213     Purpose of COVID testing Diagnostic -[facility]I     SARS-CoV-2 Specimen Source Nasopharyngeal     SARS CoV 2 Overall Result Detected     Comment: Positive COVID results called to Dr.He.  Readback confirmed by [id number] at 22:13  12/31/2019  Test performed using the Roche cobas Liat SARS-CoV-2 assay. This assay is  only for use under the Food and Drug Administrations Emergency Use  Authorization. This is a real-time RT-PCR assay for the qualitative  detection of SARS-CoV-2 RNA. Viral nucleic acids may persist in vivo,  independent of viability. Detection of viral nucleic acid does not imply the  presence of infectious virus, or that virus nucleic acid is the cause of  clinical symptoms. Negative results do not preclude SARS-CoV-2 infection and  should not be used as the sole basis for diagnosis, treatment or other  patient management decisions. Negative results must be combined with  clinical observations, patient history, and/or epidemiological information.  Invalid results may be due to inhibiting substances in the specimen and  recollection should occur. Please see Fact Sheets for patients and providers  located:  https://www.benson-chung.com/.          Influenza A Not Detected     Comment: Influenza A  NOT Detected results should be considered presumptive in  samples that have a positive SARS-CoV-2 (COVID-19) result. If co-infection  with influenza A virus is suspected and influenza virus detection would  change clinical management, please order influenza specific testing.          Influenza B Not Detected     Comment: Influenza B NOT Detected results should be considered presumptive in samples  that have a positive SARS-CoV-2 (COVID-19) result. If co-infection with  influenza B virus is suspected and influenza virus detection would change  clinical management, please order influenza specific testing.  Test performed using the Roche cobas Liat SARS-CoV-2 & Influenza A/B assay.  This assay is only  for use under the Food and Drug Administrations  Emergency Use Authorization. This is a multiplex real-time RT-PCR assay  intended for the simultaneous in vitro qualitative detection and  differentiation of SARS-CoV-2, influenza A, and influenza B virus RNA. Viral  nucleic acids may persist in vivo, independent of viability. Detection of  viral nucleic acid does not imply the presence of infectious virus, or that  virus nucleic acid is the cause of clinical symptoms. Negative results do  not preclude SARS-CoV-2, influenza A, and/or influenza B infection and  should not be used as the sole basis for diagnosis, treatment or other  patient management decisions. Negative results must be combined with  clinical observations, patient history, and/or epidemiological information.  Invalid results may be due to inhibiting substances in the specimen and  recollection should occur. Please see Fact Sheets for patients and providers  located: http://olson-hall.info/.         Narrative:      o Collect and clearly label specimen type:  o PREFERRED-Upper respiratory specimen: One Nasopharyngeal  Swab in Transport Media.  o Hand deliver to laboratory ASAP  Diagnostic -PUI  Blood Culture #1 [[id number]] Collected: 12/31/19 2012    Order Status: Completed Specimen: Arm from Blood, Venipuncture Updated: 01/06/20 0521    Narrative:      ORDER#: [id number]                                    ORDERED BY: HE, ALBERT  SOURCE: Blood, Venipuncture Arm                      COLLECTED:  12/31/19 20:12  ANTIBIOTICS AT COLL.:                                RECEIVED :  01/01/20 02:23  Culture Blood Aerobic and Anaerobic        FINAL       01/06/20 05:21  01/06/20   No growth after 5 days of incubation.      Blood Culture #2 [[id number]] Collected: 12/31/19 2012    Order Status: Completed Specimen: Arm from Blood, Venipuncture Updated: 01/06/20 0521    Narrative:      ORDER#: [id number]                                    ORDERED BY: HE,  ALBERT  SOURCE: Blood, Venipuncture Arm                      COLLECTED:  12/31/19 20:12  ANTIBIOTICS AT COLL.:                                RECEIVED :  01/01/20 02:23  Culture Blood Aerobic and Anaerobic        FINAL       01/06/20 05:21  01/06/20   No growth after 5 days of incubation.             Patient Instructions   Discharge Diet: cardiac diet   Discharge Activity: as tolerated    Follow Up Appointment:   [facility] up.    Specialty: Family Medicine  Why: follow-up post hospitalization  Contact information:  [place]  Additional information:  From the [place]:   Take [address] [place] towards [place]. Take exit [address] to merge onto [address] toward   [place]. Turn right onto [address]. Your destination will be on the left.       From the [place]:   Take [address] north towards [place]. Take exit [address] to merge onto [address] toward   [place]. Turn right onto [address]. Your destination will be on the left.           Pcp, None, MD .                        Time spent examining patient, discussing with patient/family regarding hospital course, chart review, reconciling medications and discharge planning: > 45 minutes.    Signed,  Neysa Bonito, MD    10:24 AM 01/07/2020

## 2020-01-07 NOTE — Plan of Care (Signed)
Problem: Renal Instability  Goal: Fluid and electrolyte balance are achieved/maintained  Outcome: Progressing  Flowsheets (Taken 01/07/2020 1531)  Fluid and electrolyte balance are achieved/maintained:   Monitor/assess lab values and report abnormal values   Observe for cardiac arrhythmias   Assess for confusion/personality changes  Goal: Free from infection  Outcome: Progressing  Flowsheets (Taken 01/07/2020 1531)  Free from infection: Monitor/assess for signs and symptoms of infection     Problem: Patient Receiving Advanced Renal Therapies  Goal: Therapy access site remains intact  Outcome: Progressing  Flowsheets (Taken 01/07/2020 1531)  Therapy access site remains intact: Assess therapy access site

## 2020-01-07 NOTE — Plan of Care (Signed)
Problem: Moderate/High Fall Risk Score >5  Goal: Patient will remain free of falls  Outcome: Progressing  Flowsheets (Taken 01/07/2020 0800)  High (Greater than 13):   HIGH-Visual cue at entrance to patient's room   HIGH-Bed alarm on at all times while patient in bed   HIGH-Utilize chair pad alarm for patient while in the chair   HIGH-Initiate use of floor mats as appropriate   HIGH-Consider use of low bed   HIGH-Apply yellow "Fall Risk" arm band   HIGH-Pharmacy to initiate evaluation and intervention per protocol     Problem: Safety  Goal: Patient will be free from injury during hospitalization  Outcome: Progressing  Flowsheets (Taken 01/07/2020 2012)  Patient will be free from injury during hospitalization:   Include patient/ family/ care giver in decisions related to safety   Provide alternative method of communication if needed (communication boards, writing)   Assess for patients risk for elopement and implement [place] per policy   Use appropriate transfer methods   Assess patient's risk for falls and implement fall prevention plan of care per policy   Ensure appropriate safety devices are available at the bedside   Hourly rounding   Provide and maintain safe environment     Problem: Discharge Barriers  Goal: Patient will be discharged home or other facility with appropriate resources  Outcome: Progressing  [place] (Taken 01/07/2020 2012)  Discharge to home or other facility with appropriate resources:   Provide appropriate patient education   Provide information on available health resources   Initiate discharge planning

## 2020-01-07 NOTE — Discharge Instr - AVS First Page (Addendum)
Date of Admission: 12/31/2019    Date of Discharge: 01/07/2020    Discharge Physician: Neysa Bonito, MD    Dear [name],     Thank you for choosing [facility] for your emergency care needs. We strive to provide EXCELLENT care to you and your family.     In an effort to explain clearly why you were here in the hospital, I've written a very brief summary. I hope that you find it useful. You were admitted with COVID 19 infection and C. Diff infection.Other details including formal diagnosis, medication changes, follow up appointment recommendations, and access to MyChart for formal medical records can be found in this packet.        Make sure to follow up with our [facility] or PCP None, MD your primary care doctor for follow-up. I cannot stress the importance of follow up enough.    Make sure to bring the following to your doctors appointments:  Medications in their original bottles  Glucometer/blood sugar log (if diabetic)   Weight log (if you have heart failure)    If you are unable to obtain an appointment, unable to obtain newly prescribed medications, or are unclear about any of your discharge instructions please contact me at [phone] (M-F, 8am-3pm) or weekends and after hours via the hospital operator [phone]) [phone], the hospital case manager, or your primary care physician.    Finally, as your discharging physician, you may be receiving a survey which is regarding my care. I would greatly value and appreciate your feedback as I strive for excellence.     Respectfully yours,    Neysa Bonito, MD      Chest AP Portable    Result Date: 12/31/2019  Bilateral hazy and consolidation throughout the peripheries of the midlungs and lung bases which represent atypical/viral pneumonia, or asymmetric pulmonary edema. Raphael Gibney, DO  12/31/2019 8:02 PM    Echo Results       None          Results for orders placed or performed during the hospital encounter of 06/30/19    CT Head WO Contrast    Narrative    CT HEAD WO CONTRAST      HISTORY: Altered mental status.    COMPARISON: 06/30/2019    TECHNIQUE: Multiple axial images were obtained from the base of the  skull to the vertex without contrast. Sagittal and coronal  reconstructions were performed. One of the following dose optimization  techniques was utilized in the performance of this exam: Automated  exposure control; adjustment of the mA and/or kV according to the  patient's size; or use of an iterative  reconstruction technique.   Specific details can be referenced in the facility's radiology CT exam  operational policy.    FINDINGS:  The ventricles and basal cisterns are normal in size. There is no  hemorrhage, mass, or midline shift. There is no evidence of acute  infarct.     The bones are normal.  The visualized paranasal sinuses are normal.  There is left posterior parietal soft tissue swelling similar to the  prior exam.      Impression    Stable left posterior parietal soft tissue swelling with no acute  intracranial process.    Delaney Meigs, MD   07/01/2019 11:48 PM   Results for orders placed or performed during the hospital encounter of 06/23/19   MRI Brain WO Contrast    Narrative  Clinical History:  right sided weakness and numbness     Examination:  Multiplanar MRI of the brain was performed without intravenous contrast,  using standard departmental protocol.    Comparison:  CT head dated 06/23/2019  MRI brain dated 03/19/2006    Findings:    Diffusion weighted images of the brain demonstrate no evidence of acute  infarction.  There is no evidence of acute intracranial hemorrhage,  extra-axial collection, mass effect, midline shift, herniation or  hydrocephalus.  The ventricles, sulci and cisterns appear age  appropriate.   There are mild periventricular white matter T2 FLAIR  hyperintensities, which most likely represent chronic microangiopathic  changes. There are no signal abnormalities on the  susceptibility  weighted sequences.   The major vascular flow voids are present.    The  visualized paranasal sinuses and mastoid air cells are clear.   The  surrounding soft tissues and osseous structures are unremarkable.        Impression    No evidence of acute infarction, intracranial hemorrhage, mass effect  or hydrocephalus.    Gillermina Phy, MD   06/23/2019 8:04 PM       Home Health Discharge Information    Your doctor has ordered Skilled Nursing, Physical Therapy and Occupational Therapy in-home service(s) for you while you recuperate at home, to assist you in the transition from hospital to home.    The agency that you or your representative chose to provide the service:  Name of [place] Placement: [facility] [place] NOT HEARD [place] 24-48 HOURS AFTER DISCHARGE PLEASE CALL YOUR AGENCY TO ARRANGE A TIME FOR YOUR FIRST VISIT. FOR ANY SCHEDULING CONCERNS OR QUESTIONS RELATED TO HOME HEALTH, SUCH AS TIME OR DATE PLEASE CONTACT YOUR HOME HEALTH AGENCY AT THE NUMBER LISTED ABOVE.    The above services were set up by:  Mayme Genta, RN ([place] Liaison) Phone [phone]      Please call medicaid provider for new address pick up. If you cannot get ride to dialysis tomorrow 01/09/2020 call CM [name] [phone]

## 2020-01-07 NOTE — Progress Notes (Signed)
LOS # 7      Summary of Discharge Plan:  Home with hh pt/ot    Identified Possible Discharge Barriers:  12/15 covid, cdiff, copd, dialysis mwf, deficid, iv steroid,  ra    CM Interventions and Outcome:  Cm following    Discussed above Discharge Plan with (patient, family, Care Team, others):  Care team     Case Management will continue to following on patient's discharge needs.      Mitzi Hansen, MHA, [place], CM  Case Manager [facility]

## 2020-01-07 NOTE — Progress Notes (Signed)
HD tx completed.  Pt is covid 19+,  PPE done per protocol. Pt alert and oriented. Pt denied pain and sob. No other complain given. LAVF  access is optimally working post dialysis. No bleeding on access site post tx. Pt BP soft during tx, Midodrine given by bedside RN and UF lowered.  Able to fremoved Net Fluid of 1.7L.  Reports were given to Primary nurse.       01/07/20 1815   Treatment Summary   Time Off Machine 1800   Duration of Treatment (Hours) 3   Treatment Type 1:1   Dialyzer Clearance Moderately streaked   Fluid Volume Off (mL) 2100   Prime Volume (mL) 200   Rinseback Volume (mL) 200   Fluid Given: Normal Saline (mL) 0   Fluid Given: PRBC  0 mL   Fluid Given: Albumin (mL) 0   Fluid Given: Other (mL) 0   Total Fluid Given 400   Hemodialysis Net Fluid Removed 1700   Post Treatment Assessment   Post-Treatment Weight (Kg)   (-1.7L)   Patient Response to Treatment fairly tolerated   Additional Dialyzer Used 0   Graft/Fistula Left   No placement date or time found.   Access Type: Arteriovenous Fistula  Orientation: Left  Graft/Fistula Location: Upper Arm   Fistula/ Graft Assessment Abnormalities WDL   Needle Size 16 Gauge   Cannulation Sites held Arterial (min) 5   Cannulation Sites held Venous (min) 5   Hemostasis Achieved Yes   Vitals   Temp 97.2 F (36.2 C)   Heart Rate 68   Resp Rate 18   BP 119/72   SpO2 98 %   O2 Device None (Room air)   Assessment   Mental Status Alert;Oriented   Cardiac Regularity Regular   Cardiac Rhythm Normal Sinus Rhythm   Respiratory Pattern Regular   Bilateral Breath Sounds Clear;Diminished   Edema  WDL   Generalized Edema Non Pitting Edema   General Skin Color Appropriate for ethnicity   Skin Condition/Temp Warm;Dry   Abdomen Inspection Soft   Mobility Bed   Pain Assessment   Charting Type Assessment   Pain Scale Used Numeric Scale (0-10)   Numeric Pain Scale   Pain Score 0   POSS Score 1   Education   Person taught Patient   Knowledge basis Substantial   Topics taught  Procedure   Teaching Tools Explain   Reponse Verbalizes Understanding   Bedside Nurse Communication   Name of bedside RN - post dialysis Chaney Malling, RN

## 2020-01-07 NOTE — Progress Notes (Signed)
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  Telemedicine video visits are currently being scheduled after hospital discharge for all patients.  COVID-19 pending/confirmed patients and Medicare patients are being scheduled within 48 hours of discharge.      Patient has been scheduled for a telemedicine video appointment on Friday 01/09/2020 at 3:30 pm    In order to ensure proper follow up, please confirm that accurate contact information for patient is in the chart and inform patient that this will not be an in-person visit.  Please also provide patient with Zoom Virtual Visit Instructions.    [facility] provider will assess patient during first telemedicine video visit to determine frequency and method of ongoing care.     Please contact [phone] for any questions or concerns.      [place] Worker   [employer] for [employer]   (Previously-[facility])  P: [phone]  F: [phone]

## 2020-01-07 NOTE — Progress Notes (Signed)
Arrived in room, pt is Covid 19 +.  PPE done per protocol.  Pt is alert and oriented.  Pt denied pain and sob. LAVF access is optimally working. Pt will going home after HD tx as per CN. Timeouts and safety checks done. Will closely monitor the pt.       01/07/20 1450   Bedside Nurse Communication   Name of bedside RN - pre dialysis Chaney Malling, RN   Treatment Initiation- With Dialysis Precautions   Time Out/Safety Check Completed Yes   Consent for HD signed for this hospitalization (Date) 05/23/19   Consent for HD signed for this hospitalization (Time) 1044   Blood Consent Verified N/A   Dialysis Precautions All Connections Secured;Saline Line Double Clamped;Venous Parameters Set;Arterial Parameters Set;Air Foam Detecctor Engaged   Dialysis Treatment Type Bedside   Special Considerations Covid 19 +   Is patient diabetic? Yes   RO/Hemodialysis Architectural technologist   Is Total Chlorine less than 0.1 ppm? Yes   Orignial Total Chlorine Testing Time 1420   At 4 Hour Total Chlorine Testing Time 1820   RO/Hemodialysis Facilities manager Number 6    Machine Serial Number [id number]   RO # 21   RO Serial # [id number]   pH 7.2   Pressure Test Verified Yes   Alarms Verified Passed   Machine Temperature 98.6 F (37 C)   Alarms Verified Yes   Na+ mEq (Machine) 138 mEq   Bicarb mEq (Machine) 35 mEq   Hemodialysis Conductivity (Machine) 13.8   Hemodialysis Conductivity (Meter) 13.8   Dialyzer Lot Number [id number]   Tubing Lot Number [id number]   RO Machine Log Completed Yes   Hepatitis Status   HBsAg (Antigen) Result Negative   HBsAg Date Drawn 01/02/20   Dialysis Weight   Pre-Treatment Weight (Kg)   (UTW, bedscale not working properly)   Vitals   Temp 97.8 F (36.6 C)   Heart Rate 72   Resp Rate 18   BP 136/80   SpO2 95 %   O2 Device None (Room air)   Assessment   Mental Status Alert;Oriented   Cardiac Regularity Regular   Cardiac Rhythm Normal Sinus Rhythm   Respiratory Pattern Regular   Bilateral Breath Sounds  Clear;Diminished   Edema  WDL   Generalized Edema Non Pitting Edema   General Skin Color Appropriate for ethnicity   Skin Condition/Temp Warm;Dry   Abdomen Inspection Soft   Mobility Bed   Graft/Fistula Left   No placement date or time found.   Access Type: Arteriovenous Fistula  Orientation: Left  Graft/Fistula Location: Upper Arm   Fistula/ Graft Assessment Abnormalities WDL   Pain Assessment   Charting Type Assessment   Pain Scale Used Numeric Scale (0-10)   Numeric Pain Scale   Pain Score 0   POSS Score 1   Hemodialysis Comments   Pre-Hemodialysis Comments Timeout and safety checks done

## 2020-01-07 NOTE — Plan of Care (Signed)
Significant Event: Vss. Afebrile. Nonslip socks and SCDs on. Call bell and belongings in reach. Rails up 3/4Bed in lowest position and locked. Bed alarm on. Floor mats present.  Neuro: A/ox4. Appropriate mood and affect.  Cardiac: NSR  Respiratory: Diminished LS. RA.  GI: Concentrated output.   GU: Tender abd. Loose stool.  Skin: Rash and redness peri/groin. Mositurizer applied.       Problem: Moderate/High Fall Risk Score >5  Goal: Patient will remain free of falls  Outcome: Progressing  Flowsheets (Taken 01/06/2020 2100)  High (Greater than 13):   HIGH-Consider use of low bed   HIGH-Initiate use of floor mats as appropriate   HIGH-Pharmacy to initiate evaluation and intervention per protocol   HIGH-Apply yellow "Fall Risk" arm band   HIGH-Utilize chair pad alarm for patient while in the chair   HIGH-Bed alarm on at all times while patient in bed   HIGH-Visual cue at entrance to patient's room   MOD-Remain with patient during toileting   LOW-Fall Interventions Appropriate for Low Fall Risk     Problem: Safety  Goal: Patient will be free from injury during hospitalization  Outcome: Progressing  Flowsheets (Taken 01/07/2020 0052)  Patient will be free from injury during hospitalization:   Assess patient's risk for falls and implement fall prevention plan of care per policy   Provide and maintain safe environment   Use appropriate transfer methods   Ensure appropriate safety devices are available at the bedside   Include patient/ family/ care giver in decisions related to safety   Hourly rounding   Assess for patients risk for elopement and implement [place] per policy   Provide alternative method of communication if needed (communication boards, writing)     Problem: Compromised Tissue integrity  Goal: Damaged tissue is healing and protected  Outcome: Progressing  Flowsheets (Taken 01/07/2020 0052)  Damaged tissue is healing and protected:   Monitor/assess Braden scale every shift   Provide wound care per  wound care algorithm   Reposition patient every 2 hours and as needed unless able to reposition self   Increase activity as tolerated/progressive mobility   Relieve pressure to bony prominences for patients at moderate and high risk   Keep intact skin clean and dry   Use bath wipes, not soap and water, for daily bathing   Avoid shearing injuries   Use incontinence wipes for cleaning urine, stool and caustic drainage. Foley care as needed   Monitor external devices/tubes for correct placement to prevent pressure, friction and shearing   Encourage use of lotion/moisturizer on skin   Monitor patient's hygiene practices   Consult/collaborate with wound care nurse   Utilize specialty bed     Problem: Compromised Hemodynamic Status  Goal: Vital signs and fluid balance maintained/improved  Outcome: Progressing  Flowsheets (Taken 01/07/2020 0052)  Vital signs and fluid balance are maintained/improved:   Position patient for maximum circulation/cardiac output   Monitor intake and output. Notify LIP if urine output is less than 30 mL/hour.   Monitor and compare daily weight   Monitor/assess vitals and hemodynamic parameters with position changes   Monitor/assess lab values and report abnormal values     Problem: Renal Instability  Goal: Fluid and electrolyte balance are achieved/maintained  Outcome: Progressing  Flowsheets (Taken 01/07/2020 0052)  Fluid and electrolyte balance are achieved/maintained:   Monitor intake and output every shift   Monitor/assess lab values and report abnormal values   Monitor daily weight   Provide adequate hydration  Assess for confusion/personality changes   Assess and reassess fluid and electrolyte status   Observe for seizure activity and initiate seizure precautions if indicated   Observe for cardiac arrhythmias   Monitor for muscle weakness   Follow fluid restrictions/IV/PO parameters

## 2020-01-08 LAB — CBC
Absolute NRBC: 0.02 10*3/uL — ABNORMAL HIGH (ref 0.00–0.00)
Hematocrit: 32 % — ABNORMAL LOW (ref 34.7–43.7)
Hgb: 9.5 g/dL — ABNORMAL LOW (ref 11.4–14.8)
MCH: 28.5 pg (ref 25.1–33.5)
MCHC: 29.7 g/dL — ABNORMAL LOW (ref 31.5–35.8)
MCV: 96.1 fL — ABNORMAL HIGH (ref 78.0–96.0)
MPV: 10.6 fL (ref 8.9–12.5)
Nucleated RBC: 0.3 /100 WBC — ABNORMAL HIGH (ref 0.0–0.0)
Platelets: 214 10*3/uL (ref 142–346)
RBC: 3.33 10*6/uL — ABNORMAL LOW (ref 3.90–5.10)
RDW: 15 % (ref 11–15)
WBC: 6.63 10*3/uL (ref 3.10–9.50)

## 2020-01-08 LAB — BASIC METABOLIC PANEL
Anion Gap: 16 — ABNORMAL HIGH (ref 5.0–15.0)
BUN: 38 mg/dL — ABNORMAL HIGH (ref 7.0–19.0)
CO2: 22 mEq/L (ref 22–29)
Calcium: 8.5 mg/dL (ref 8.5–10.5)
Chloride: 98 mEq/L — ABNORMAL LOW (ref 100–111)
Creatinine: 6.2 mg/dL — ABNORMAL HIGH (ref 0.6–1.0)
Glucose: 260 mg/dL — ABNORMAL HIGH (ref 70–100)
Potassium: 4.7 mEq/L (ref 3.5–5.1)
Sodium: 136 mEq/L (ref 136–145)

## 2020-01-08 LAB — GLUCOSE WHOLE BLOOD - POCT
Whole Blood Glucose POCT: 170 mg/dL — ABNORMAL HIGH (ref 70–100)
Whole Blood Glucose POCT: 269 mg/dL — ABNORMAL HIGH (ref 70–100)

## 2020-01-08 LAB — HEPATITIS B CORE ANTIBODY, IGM: Hepatitis B Core IgM: NONREACTIVE

## 2020-01-08 LAB — GFR: EGFR: 8.2

## 2020-01-08 LAB — HEMOLYSIS INDEX: Hemolysis Index: 2 (ref 0–18)

## 2020-01-08 NOTE — Progress Notes (Signed)
Patient will cohort at [facility] on Friday 12/17 and resume to [place] on Monday 12/20. Writer was unable to speak with anyone at clinic to confirm chair time Production designer, theatre/television/film in a meeting, RN is new and unfamiliar with schedule, clinic does not currently have an [place]). Patient will be instructed to call [place] this evening to obtain chair time. Instructions added to AVS.     Freida Busman, HD Care Coordinator  [phone]

## 2020-01-08 NOTE — Progress Notes (Signed)
Patient received resting in bed. Patient denies any pain. BM X2 this shift. NO IV present at start of shift. Rounding done. Patient safety measures maintained.    D/C instructions reviewed, Pt. Expressed understanding.

## 2020-01-08 NOTE — Progress Notes (Signed)
[facility] PROGRESS NOTE  Aaron Edelman, x [phone] [facility] Spectralink)      Date Time: 01/08/20 6:27 AM  Patient Name: [name]  Attending Physician: Neysa Bonito, MD    CC: follow-up esrd    Assessment:     1. ESRD on HD MWF at [facility]  2. Covid 19 pneumonia - on RA  3. Anemia in CKD  4. HTN  5. C diff      Recommendations:     Next dialysis tomorrow at outpatient clinic  Stable for discharge from renal standpoint    Case discussed with: HD RN    Armando Reichert, MD  [facility]  [phone] (office)  X [phone] ([place])      Subjective: Dialyzed with 2.1 L removed yesterday    Review of Systems:   Deferred today; per chart review, persistent diarrhea    Physical Exam:     Vitals:    01/07/20 1815 01/07/20 2016 01/07/20 2325 01/08/20 0354   BP: 119/72 135/60 123/59 130/74   Pulse: 68 81 79 83   Resp: 18 18 22 20    Temp: 97.2 F (36.2 C) 97.5 F (36.4 C) 97.9 F (36.6 C) 98.2 F (36.8 C)   TempSrc:  Oral Oral Oral   SpO2: 98% 94% 96% 96%   Weight:       Height:           Intake and Output Summary (Last 24 hours) at Date Time    Intake/Output Summary (Last 24 hours) at 01/08/2020 2080  Last data filed at 01/07/2020 1815  Gross per 24 hour   Intake 100 ml   Output 1700 ml   Net -1600 ml       Limiting exposure and conserving PPE during time of pandemic, as per CDC guidelines and [place] hospital policy.            Meds:      Scheduled Meds: PRN Meds:    atorvastatin, 40 mg, Oral, Daily  cholestyramine, 2 packet, Oral, BID  darbepoetin alfa, 60 mcg, Subcutaneous, Weekly  fidaxomicin, 200 mg, Oral, BID  heparin (porcine), 5,000 Units, Subcutaneous, Q12H SCH  insulin glargine, 20 Units, Subcutaneous, QHS  insulin lispro, 1-4 Units, Subcutaneous, QHS  insulin lispro, 1-8 Units, Subcutaneous, TID AC  miconazole 2 % with zinc oxide, , Topical, Q6H  pantoprazole, 40 mg, Oral, QAM AC  predniSONE, 40 mg, Oral, QAM W/BREAKFAST  sevelamer, 800 mg, Oral, TID MEALS  vitamin  C, 500 mg, Oral, Daily          Continuous Infusions:   acetaminophen, 650 mg, Q6H PRN   Or  acetaminophen, 650 mg, Q6H PRN  acetaminophen, 650 mg, Once PRN  acetaminophen, 650 mg, Q6H PRN  bisacodyl, 5 mg, Daily PRN  dextrose, 15 g of glucose, PRN   And  dextrose, 12.5 g, PRN   And  glucagon (rDNA), 1 mg, PRN  guaiFENesin-codeine, 5 mL, Q4H PRN  hydrALAZINE, 25 mg, Q8H PRN  lidocaine, , 4X Daily PRN  melatonin, 3 mg, QHS PRN  midodrine, 10 mg, TID PRN  naloxone, 0.2 mg, PRN  ondansetron, 4 mg, Q6H PRN  zinc Oxide, , PRN              Labs:     Recent Labs   Lab 01/08/20  0407 01/07/20  0649 01/06/20  0805   WBC 6.63 7.18 6.15   Hgb 9.5* 9.7* 8.7*   Hematocrit 32.0* 32.5* 29.0*  Platelets 214 228 208     Recent Labs   Lab 01/08/20  0407 01/07/20  0649 01/06/20  0804 01/05/20  1349 01/05/20  1349 01/03/20  0430 01/02/20  0411   Sodium 136 139 138  More results in Results Review 135*  More results in Results Review 140   Potassium 4.7 4.6 4.7  More results in Results Review 5.4*  More results in Results Review 4.1   Chloride 98* 103 101  More results in Results Review 100  More results in Results Review 99*   CO2 22 21* 26  More results in Results Review 20*  More results in Results Review 27   BUN 38.0* 55.0* 46.0*  More results in Results Review 71.0*  More results in Results Review 55.0*   Creatinine 6.2* 8.5* 7.4*  More results in Results Review 9.7*  More results in Results Review 7.7*   Calcium 8.5 8.9 8.3*  More results in Results Review 8.4*  More results in Results Review 7.7*   Albumin  --   --   --   --  2.7*  --   --    Phosphorus  --   --   --   --   --   --  5.5*   Glucose 260* 105* 201*  More results in Results Review 270*  More results in Results Review 208*   EGFR 8.2 5.7 6.7  More results in Results Review 4.9  More results in Results Review 6.4   More results in Results Review = values in this interval not displayed.           Signed by: Armando Reichert, MD

## 2020-01-08 NOTE — Progress Notes (Signed)
CM reached out to dialysis coordinator.  Patient may need a special dialysis clinic while on isolation from covid 19.   Patient needs dialysis set up before discharging from hospital.     Mitzi Hansen, MHA, [place], CM  Case Manager [facility]

## 2020-01-08 NOTE — Progress Notes (Addendum)
Established HD patient at [facility] on MWF 2pm shift. Patient tested positive for COVID19 on 12/8. [facility] policy is that pt can return to general population 10 post positive test.  Pt will need to cohort for 1X only on Friday 10/17.  Referral sent to [facility] admissions to process. OP HD pending.     Freida Busman, HD Care Coordinator  [phone]

## 2020-01-08 NOTE — OT Progress Note (Signed)
[facility]   Occupational Therapy Attempt Note    Patient:  [name] MRN#:  [id number]  Unit:  [address] [address] INTERMEDIATE CARE Room/Bed:  F0071/Q1975-O      Occupational Therapy treatment attempted on 01/08/2020 at 4:38 PM unable to complete secondary to, per chart review, Pt preparing for discharge today  Occupational therapy will follow up at a later time/date as able, if Pt is not discharged.    Dannial Monarch, OTR/L    Physical Medicine and [facility]  [phone]    01/08/2020  4:38 PM

## 2020-01-08 NOTE — Discharge Summary (Signed)
[facility]      Patient: [name]  Admission Date: 12/31/2019   DOB: 02-Feb-1955  Discharge Date: 01/08/2020    MRN: [id number]  Discharge Attending:Cassidie Veiga Velora Heckler, MD     Referring Physician: Pcp, None, MD  PCP: Pcp, None, MD       DISCHARGE SUMMARY     Discharge Information   Admission Diagnosis:   Pneumonia due to COVID-19 virus    Discharge Diagnosis:   Active Hospital Problems    Diagnosis    Diarrhea    Pneumonia due to COVID-19 virus    History of CVA (cerebrovascular accident)    History of COPD    Hyperlipidemia    Hypertension    GERD (gastroesophageal reflux disease)    Type 2 diabetes mellitus, with long-term current use of insulin    h/o orthostatic hypotension    ESRD (end stage renal disease) on dialysis    Iron deficiency anemia secondary to inadequate dietary iron intake        Admission Condition: unstable, intractable diarrhea, poor po intake   Discharge Condition: fair   Consultants: Infectious Disease- Dr. Lahoma Crocker   Functional Status: ambulatory   Discharged to: home    Discharge Medications:     Medication List      START taking these medications    amLODIPine 10 MG tablet  Commonly known as: NORVASC  Take 1 tablet (10 mg total) by mouth daily  Notes to patient: Starting 01/08/20     cholestyramine 4 g packet  Commonly known as: QUESTRAN  Take 2 packets by mouth 2 (two) times daily  Notes to patient: Starting 01/08/20     fidaxomicin 200 MG Tabs  Commonly known as: DIFICID  Take 1 tablet (200 mg total) by mouth 2 (two) times daily for 10 days  Notes to patient: Starting 01/08/20     insulin glargine 100 UNIT/ML injection  Commonly known as: LANTUS  Inject 20 Units into the skin nightly     lactobacillus/streptococcus Caps  Take 1 capsule by mouth daily  Notes to patient: Starting 01/08/20     Lidocaine 2 % Gel  Apply 1 application topically 4 (four) times daily To anal / buttock area  Replaces: lidocaine 5 %     methylPREDNISolone 4 MG tablet  Commonly known as:  MEDROL DOSPACK  follow package directions     zinc Oxide 40 % Pste paste  Commonly known as: DESITIN  Apply topically as needed for Dry Skin        CHANGE how you take these medications    midodrine 10 MG tablet  Commonly known as: PROAMATINE  Take 1 tablet (10 mg total) by mouth 3 (three) times daily as needed (SBP < 110 or DBP < 70)  What changed:    when to take this   reasons to take this  Notes to patient: Take BP before        CONTINUE taking these medications    acetaminophen 325 MG tablet  Commonly known as: TYLENOL  Take 2 tablets (650 mg total) by mouth every 6 (six) hours as needed for Pain or Fever     atorvastatin 40 MG tablet  Commonly known as: LIPITOR     bisacodyl 5 MG EC tablet  Commonly known as: DULCOLAX  Take 1 tablet (5 mg total) by mouth daily as needed for Constipation     darbepoetin alfa 60 MCG/0.3ML injection  Commonly known as: ARANESP  Inject 0.3 mLs (60  mcg total) into the skin once a week     melatonin 3 mg tablet  Take 1 tablet (3 mg total) by mouth nightly as needed for Sleep     nystatin powder  Commonly known as: NYSTOP  Apply 1 application topically 2 (two) times daily     ondansetron 4 MG disintegrating tablet  Commonly known as: ZOFRAN-ODT  Take 1 tablet (4 mg total) by mouth every 6 (six) hours as needed for Nausea     pantoprazole 40 MG tablet  Commonly known as: PROTONIX  Take 1 tablet (40 mg total) by mouth 2 (two) times daily     sevelamer 800 MG tablet  Commonly known as: RENVELA     Trulicity 6.60 YT/0.1SW Sopn  Generic drug: Dulaglutide     vitamin C 500 MG tablet  Commonly known as: ASCORBIC ACID  Take 1 tablet (500 mg total) by mouth daily     zinc sulfate 220 (50 Zn) MG capsule  Commonly known as: ZINCATE  Take 1 capsule (220 mg total) by mouth daily        STOP taking these medications    colchicine 0.6 MG tablet     collagenase ointment  Commonly known as: SANTYL     famotidine 40 MG tablet  Commonly known as: PEPCID     fludrocortisone 0.1 MG tablet  Commonly  known as: FLORINEF     insulin aspart 100 UNIT/ML injection  Commonly known as: NovoLOG     lidocaine 5 %  Commonly known as: LIDODERM  Replaced by: Lidocaine 2 % Gel     senna 8.6 MG Tabs  Commonly known as: SENOKOT     sodium hypochlorite 0.125 % Soln  Commonly known as: DAKIN'S QUARTER STRENGTH           Where to Get Your Medications      These medications were sent to [facility], [address],  [place] [address]    Phone: [phone]    amLODIPine 10 MG tablet   cholestyramine 4 g packet   fidaxomicin 200 MG Tabs   insulin glargine 100 UNIT/ML injection   lactobacillus/streptococcus Caps   Lidocaine 2 % Gel   methylPREDNISolone 4 MG tablet   zinc Oxide 40 % Pste paste     Information about where to get these medications is not yet available    Ask your nurse or doctor about these medications   midodrine 10 MG tablet             Hospital Course   Presentation History     Shortness of breath, secondary to COVID 19 PNA    See HPI for details.    Hospital Course (8 Days)     [name] is a 64 y.o. female admitted with Pneumonia due to COVID-19 virus with acute hypoxic respiratory failure requiring supplemental oxygen.  Patient was started on steroids and was able to be titrated off supplemental oxygen.  Patient developed diarrhea and was diagnosed with C. difficile December 9.  Intractable diarrhea with poor p.o. intake.  Patient failed oral vancomycin therapy.  Infectious disease was consulted and patient improved with cholestyramine and dificid. Patient < 3BM/ 24 hours at time of discharge. Tolerating po intake.  Patient discharged home on December 15.    Acute hypoxic respiratory failure, secondary to COVID 19 PNA on admission improved  Pneumonia,secondary to COVID 19 virus, on  admission  H/o COPD  -No h/o chronic respiratory failure, patient weaned off supplemental oxygen   -steroids: transition from dexamethasone to  medrol pack on discharge  -Does not meet criteria for remdesivir  -Encourage IS use    Diarrhea, C diff +  -On admission, > 12 watery BMs/ 24 hours. Shigella/Salmonella/Campylobacter tests are negative  -No BM since last night  -ID Dr. Lahoma Crocker consulted.   -failed po vanc and flagyl  -on fidoxamicin and cholestyramine (started 12/13-12/23) to complete a 10-day course  -Lidocaine gel and desitin for peri-anal irritation     Iron Deficiency Anemia  -on Aranesp per nephrology    ESRD on HD, MWF. Nephrology consulted and following  Dialysis being set up outpatient as patient is Covid positive and cannot return to regular dialysis center till quarantine time concluded    DM2, on insulin. HbA1c 10.2  -continue lantus 20units QHS   -needs to follow-up with PCP with [facility] log post discharge for further management of DM    HTN, chronic  -on prn midodrine  -started amlodipine while inpatient and titrated up to 10 mg daily (01/06/20)  -stopped fludrocortisone after discussion with nephrology   -Patient to follow-up with primary care physician for further evaluation and management of hypertension and antihypertensive medication adjustment    Dyslipidemia, chronic, continue lipitor    H/o CVA, no acute deficits    Morbid Obesity/ Obesity- a BMI over 30 with a chronic condition (DM,COPD, HTN, ESRD)  is clinically significant        Procedures/Imaging:   Chest AP Portable    Result Date: 12/31/2019  Bilateral hazy and consolidation throughout the peripheries of the midlungs and lung bases which represent atypical/viral pneumonia, or asymmetric pulmonary edema. Raphael Gibney, DO  12/31/2019 8:02 PM           Best Practices   Was the patient admitted with either a CHF Exacerbation or Pneumonia? COVID 19 PNA     Progress Note/Physical Exam at Discharge     Subjective: Events overnight.  Patient denies any abdominal pain or nausea or vomiting.  Tolerating diet well.  Patient had a 1 semiformed bowel movement overnight.  Patient's  anal pain controllable.  Patient feels safe to go home, tolerating p.o. intake.    Vitals:    01/08/20 0354 01/08/20 0834 01/08/20 1155 01/08/20 1244   BP: 130/74 135/63 188/75 (!) 175/100   Pulse: 83 82 72    Resp: 20 18 20     Temp: 98.2 F (36.8 C) 97.9 F (36.6 C) 97.9 F (36.6 C)    TempSrc: Oral Oral Oral    SpO2: 96% 96% 97%    Weight:       Height:             General: NAD, AAOx3  HEENT: eomi, sclera anicteric, OP: Clear, MMM  Neck: supple, FROM, no LAD  Cardiovascular: RRR, no m/r/g  Lungs: CTAB, no w/r/r  Abdomen: soft, +BS, NT/ND, no masses, no g/r  Extremities: no C/C/E  Skin: no rashes or lesions noted  Neuro: CN 2-12 intact; No Focal neurological deficits       Diagnostics     Labs/Studies Pending at Discharge: No    Last Labs   Recent Labs   Lab 01/08/20  0407 01/07/20  0649 01/06/20  0805   WBC 6.63 7.18 6.15   RBC 3.33* 3.37* 3.02*   Hgb 9.5* 9.7* 8.7*   Hematocrit 32.0* 32.5* 29.0*   MCV 96.1* 96.4* 96.0  Platelets 214 228 208       Recent Labs   Lab 01/08/20  0407 01/07/20  0649 01/06/20  0804 01/05/20  1349 01/05/20  0546   Sodium 136 139 138 135* 136   Potassium 4.7 4.6 4.7 5.4* 5.2*   Chloride 98* 103 101 100 100   CO2 22 21* 26 20* 21*   BUN 38.0* 55.0* 46.0* 71.0* 61.0*   Creatinine 6.2* 8.5* 7.4* 9.7* 9.1*   Glucose 260* 105* 201* 270* 189*   Calcium 8.5 8.9 8.3* 8.4* 8.4*       Microbiology Results (last 15 days)     Procedure Component Value Units Date/Time    Clostridium difficile toxin B PCR [[id number]]  (Abnormal) Collected: 01/01/20 2336    Order Status: Completed Specimen: Stool Updated: 01/03/20 0130     Stool Clostridium difficile Toxin B PCR Positive     Comment: Test performed using a BDMax PCR Assay. Due to the high  sensitivity of the assay, repeat testing is not recommended  and will be cancelled following [facility] policy. Molecular  detection of C. difficile is not recommended as a test of cure  or for surveillance purposes. A positive test may indicate  infection or  colonization with C. difficile. Testing will not  be performed on formed stool.         Narrative:      Patient's current admission began:->less than or equal to 3  days ago  Cancel C-Diff order if no diarrhea in 12 hours?->Yes    cdiff ok to process per dr Manson Allan    Stool Cryptosporidium / Giardia Antigen [[id number]] Collected: 01/01/20 2336    Order Status: Completed Specimen: Stool Updated: 01/02/20 0900    Narrative:      ORDER#: [id number]                                    ORDERED BY: Murlean Hark  SOURCE: Stool                                        COLLECTED:  01/01/20 23:36  ANTIBIOTICS AT COLL.:                                RECEIVED :  01/02/20 05:24  Cryptosporidium/Giardia Antigen            FINAL       01/02/20 09:00  01/02/20   Negative for Cryptosporidium parvum antigen             Negative for Giardia lamblia antigen      Stool for Salm/Shig/Campy/Shiga PCR [[id number]] Collected: 01/01/20 2336    Order Status: Completed Specimen: Stool Updated: 01/02/20 1627     Stool Salmonella Species by PCR Negative     Stool Shigella Species/Enteroinvasive Escherichia coli PCR Negative     Stool Campylobacter jejunii/coli by PCR Negative     Stool Shiga Toxin by PCR Negative     Comment: Testing performed using a BDMax multiplex PCR panel which  detects bacterial DNA. A positive result does not necessarily  indicate the presence of viable organisms. Positive results  do not rule out co-infection with other organisms that are  not detected by this test. This panel does not differentiate  between Shigella spp. and Enteroinvasive Escherichia coli  (EIEC), which are closely related and may cause similar  illness.  A result of Shiga Toxin POSITIVE, Shigella/EIEC NEGATIVE is  most likely due to the presence of Shiga toxin-producing  E. coli (STEC). Shigella species isolated in the [place]  are rarely positive for Shiga Toxin.  Serotyping of Salmonella, Shigella, or Shiga Toxin DNA  positive samples will be  performed by [facility] for public health  surveillance purposes.  Serotyping results are available upon request.         COVID-19 (SARS-CoV-2) and Influenza A/B, NAA (Liat Rapid) [[id number]]  (Abnormal) Collected: 12/31/19 2054    Order Status: Completed Specimen: Culturette from Nasopharyngeal Updated: 12/31/19 2213     Purpose of COVID testing Diagnostic -[facility]     SARS-CoV-2 Specimen Source Nasopharyngeal     SARS CoV 2 Overall Result Detected     Comment: Positive COVID results called to Dr.He.  Readback confirmed by [id number] at 22:13  12/31/2019  Test performed using the Roche cobas Liat SARS-CoV-2 assay. This assay is  only for use under the Food and Drug Administrations Emergency Use  Authorization. This is a real-time RT-PCR assay for the qualitative  detection of SARS-CoV-2 RNA. Viral nucleic acids may persist in vivo,  independent of viability. Detection of viral nucleic acid does not imply the  presence of infectious virus, or that virus nucleic acid is the cause of  clinical symptoms. Negative results do not preclude SARS-CoV-2 infection and  should not be used as the sole basis for diagnosis, treatment or other  patient management decisions. Negative results must be combined with  clinical observations, patient history, and/or epidemiological information.  Invalid results may be due to inhibiting substances in the specimen and  recollection should occur. Please see Fact Sheets for patients and providers  located:  https://www.benson-chung.com/.          Influenza A Not Detected     Comment: Influenza A  NOT Detected results should be considered presumptive in  samples that have a positive SARS-CoV-2 (COVID-19) result. If co-infection  with influenza A virus is suspected and influenza virus detection would  change clinical management, please order influenza specific testing.          Influenza B Not Detected     Comment: Influenza B NOT Detected results should be considered presumptive in  samples  that have a positive SARS-CoV-2 (COVID-19) result. If co-infection with  influenza B virus is suspected and influenza virus detection would change  clinical management, please order influenza specific testing.  Test performed using the Roche cobas Liat SARS-CoV-2 & Influenza A/B assay.  This assay is only for use under the Food and Drug Administrations  Emergency Use Authorization. This is a multiplex real-time RT-PCR assay  intended for the simultaneous in vitro qualitative detection and  differentiation of SARS-CoV-2, influenza A, and influenza B virus RNA. Viral  nucleic acids may persist in vivo, independent of viability. Detection of  viral nucleic acid does not imply the presence of infectious virus, or that  virus nucleic acid is the cause of clinical symptoms. Negative results do  not preclude SARS-CoV-2, influenza A, and/or influenza B infection and  should not be used as the sole basis for diagnosis, treatment or other  patient management decisions. Negative results must be combined with  clinical observations, patient history, and/or epidemiological information.  Invalid results may be due to inhibiting substances in the specimen and  recollection should occur. Please see  Fact Sheets for patients and providers  located: http://olson-hall.info/.         Narrative:      o Collect and clearly label specimen type:  o PREFERRED-Upper respiratory specimen: One Nasopharyngeal  Swab in Transport Media.  o Hand deliver to laboratory ASAP  Diagnostic -[facility]I    Blood Culture #1 [[id number]] Collected: 12/31/19 2012    Order Status: Completed Specimen: Arm from Blood, Venipuncture Updated: 01/06/20 0521    Narrative:      ORDER#: [id number]                                    ORDERED BY: HE, ALBERT  SOURCE: Blood, Venipuncture Arm                      COLLECTED:  12/31/19 20:12  ANTIBIOTICS AT COLL.:                                RECEIVED :  01/01/20 02:23  Culture Blood Aerobic and Anaerobic         FINAL       01/06/20 05:21  01/06/20   No growth after 5 days of incubation.      Blood Culture #2 [[id number]] Collected: 12/31/19 2012    Order Status: Completed Specimen: Arm from Blood, Venipuncture Updated: 01/06/20 0521    Narrative:      ORDER#: [id number]                                    ORDERED BY: HE, ALBERT  SOURCE: Blood, Venipuncture Arm                      COLLECTED:  12/31/19 20:12  ANTIBIOTICS AT COLL.:                                RECEIVED :  01/01/20 02:23  Culture Blood Aerobic and Anaerobic        FINAL       01/06/20 05:21  01/06/20   No growth after 5 days of incubation.             Patient Instructions   Discharge Diet: cardiac diet   Discharge Activity: as tolerated    Follow Up Appointment:   [facility] Follow up.    Specialty: Family Medicine  Why: follow-up post hospitalization  Contact information:  [place]  Additional information:  From the [place]:   Take [address] south towards [place]. Take exit [address] to merge onto [address] toward   [place]. Turn right onto [address]. Your destination will be on the left.       From the [place]:   Take [address] north towards [place]. Take exit [address] to merge onto [address] toward   [place]. Turn right onto [address]. Your destination will be on the left.           Pcp, None, MD.           [facility], [phone].           [facility] Follow up on 01/10/2020.    Specialty: Dialysis Clinic  Why: You are scheduled to have one treatment at [facility] on Sat 01/10/20 at 8am  Please bring proper ID, insurance card, list of medications.   Contact information:  [place] [address]  [phone]                        Time spent examining patient, discussing with patient/family regarding hospital course, chart review, reconciling medications and discharge planning: > 45  minutes.    Signed,  Neysa Bonito, MD    5:40 PM 01/08/2020

## 2020-01-08 NOTE — Progress Notes (Signed)
Patient discharging home with [facility] set up. HH will see patient this weekend. TCM appointment for patient has been initiated. HD to resume per Dialysis coordinator not and avs instructions. Husband to transport patient home from hospital.    01/08/20 1255   Discharge Disposition   Patient preference/choice provided? Yes   Physical Discharge Disposition Home, Home Health   Mode of Transportation Car   Patient/Family/POA notified of transfer plan Yes   Patient agreeable to discharge plan/expected d/c date? Yes   Bedside nurse notified of transport plan? Yes   Special requirements for patient during transport: Isolation   CM Interventions   Follow up appointment scheduled? Yes   Is this a Medicare focused or COVID patient? Yes   Is this appointment within 48-72 hours? Yes   Referral made for home health RN visit? Yes   Multidisciplinary rounds/family meeting before d/c? Yes   Medicare Checklist   Is this a Medicare patient? Yes   Mitzi Hansen, MHA, [place], [place]  Case Manager [facility]

## 2020-01-08 NOTE — Progress Notes (Addendum)
RN stated patient was concerned about using the medicaid services to transport her normaly to dialysis.   CM called daughter to see if she can [facility]. She stated she worked.   CM called husband Phillips Odor [phone]   To see if he can transport patient to dialysis center tomorrow. CM hadto leave a message for him    CM called [facility] and they are only able to do cohort T,T,S third shift       Mitzi Hansen, [place], [place], [place]  Case Manager [facility]

## 2020-01-09 ENCOUNTER — Telehealth (INDEPENDENT_AMBULATORY_CARE_PROVIDER_SITE_OTHER): Payer: 59 | Admitting: Internal Medicine

## 2020-01-12 NOTE — UM Notes (Signed)
RETRO REVIEW.ADD'L CLINICALS REQ BY INS:    12/15 Pt is unable to be D/C because she needs a dialysis facility that accepts Covid positive. Discharge cancelled d/t pt's ongoing need for hd. Established HD patient at [facility] on MWF 2pm shift. Patient tested positive for COVID19 on 12/8. [facility] policy is that pt can return to general population 10 post positive test.  Pt will need to cohort for 1X only on Friday 10/17.  Referral sent to [facility] admissions to process. OP HD pending.       12/16   DISCHARGE SUMMARY     Discharge Information   Admission Diagnosis:   Pneumonia due to COVID-19 virus    Discharge Diagnosis:       Active Hospital Problems    Diagnosis    Diarrhea    Pneumonia due to COVID-19 virus    History of CVA (cerebrovascular accident)    History of COPD    Hyperlipidemia    Hypertension    GERD (gastroesophageal reflux disease)    Type 2 diabetes mellitus, with long-term current use of insulin    h/o orthostatic hypotension    ESRD (end stage renal disease) on dialysis    Iron deficiency anemia secondary to inadequate dietary iron intake        Admission Condition: unstable, intractable diarrhea, poor po intake   Discharge Condition: fair   Consultants: Infectious Disease- Dr. Lahoma Crocker   Functional Status: ambulatory   Discharged to: home

## 2020-01-14 MED ORDER — VANCOMYCIN HCL 125 MG PO CAPS
ORAL_CAPSULE | ORAL | 0 refills | Status: DC
Start: 2020-01-14 — End: 2020-01-22

## 2020-01-19 ENCOUNTER — Emergency Department: Payer: 59

## 2020-01-19 ENCOUNTER — Observation Stay
Admission: EM | Admit: 2020-01-19 | Discharge: 2020-01-22 | Disposition: A | Discharge status: Home / Self Care | Payer: 59 | Attending: Internal Medicine | Admitting: Internal Medicine

## 2020-01-19 DIAGNOSIS — Z8619 Personal history of other infectious and parasitic diseases: Secondary | ICD-10-CM | POA: Insufficient documentation

## 2020-01-19 DIAGNOSIS — Z79899 Other long term (current) drug therapy: Secondary | ICD-10-CM | POA: Insufficient documentation

## 2020-01-19 DIAGNOSIS — Z6841 Body Mass Index (BMI) 40.0 and over, adult: Secondary | ICD-10-CM | POA: Insufficient documentation

## 2020-01-19 DIAGNOSIS — R0602 Shortness of breath: Secondary | ICD-10-CM

## 2020-01-19 DIAGNOSIS — N186 End stage renal disease: Secondary | ICD-10-CM | POA: Insufficient documentation

## 2020-01-19 DIAGNOSIS — E8770 Fluid overload, unspecified: Secondary | ICD-10-CM | POA: Insufficient documentation

## 2020-01-19 DIAGNOSIS — M11231 Other chondrocalcinosis, right wrist: Secondary | ICD-10-CM | POA: Insufficient documentation

## 2020-01-19 DIAGNOSIS — Z794 Long term (current) use of insulin: Secondary | ICD-10-CM | POA: Insufficient documentation

## 2020-01-19 DIAGNOSIS — U071 COVID-19: Secondary | ICD-10-CM | POA: Insufficient documentation

## 2020-01-19 DIAGNOSIS — I120 Hypertensive chronic kidney disease with stage 5 chronic kidney disease or end stage renal disease: Secondary | ICD-10-CM | POA: Insufficient documentation

## 2020-01-19 DIAGNOSIS — E1165 Type 2 diabetes mellitus with hyperglycemia: Secondary | ICD-10-CM | POA: Insufficient documentation

## 2020-01-19 DIAGNOSIS — Z992 Dependence on renal dialysis: Secondary | ICD-10-CM | POA: Insufficient documentation

## 2020-01-19 DIAGNOSIS — J1282 Pneumonia due to coronavirus disease 2019: Secondary | ICD-10-CM | POA: Insufficient documentation

## 2020-01-19 DIAGNOSIS — G5600 Carpal tunnel syndrome, unspecified upper limb: Secondary | ICD-10-CM | POA: Insufficient documentation

## 2020-01-19 DIAGNOSIS — J440 Chronic obstructive pulmonary disease with acute lower respiratory infection: Secondary | ICD-10-CM | POA: Insufficient documentation

## 2020-01-19 DIAGNOSIS — I160 Hypertensive urgency: Principal | ICD-10-CM | POA: Insufficient documentation

## 2020-01-19 DIAGNOSIS — M25531 Pain in right wrist: Secondary | ICD-10-CM

## 2020-01-19 DIAGNOSIS — R778 Other specified abnormalities of plasma proteins: Secondary | ICD-10-CM | POA: Insufficient documentation

## 2020-01-19 DIAGNOSIS — E1122 Type 2 diabetes mellitus with diabetic chronic kidney disease: Secondary | ICD-10-CM | POA: Insufficient documentation

## 2020-01-19 DIAGNOSIS — M13841 Other specified arthritis, right hand: Secondary | ICD-10-CM | POA: Insufficient documentation

## 2020-01-19 DIAGNOSIS — E875 Hyperkalemia: Secondary | ICD-10-CM | POA: Insufficient documentation

## 2020-01-19 DIAGNOSIS — R079 Chest pain, unspecified: Secondary | ICD-10-CM

## 2020-01-19 LAB — CBC AND DIFFERENTIAL
Absolute NRBC: 0 10*3/uL (ref 0.00–0.00)
Basophils Absolute Automated: 0.02 10*3/uL (ref 0.00–0.08)
Basophils Automated: 0.2 %
Eosinophils Absolute Automated: 0.06 10*3/uL (ref 0.00–0.44)
Eosinophils Automated: 0.7 %
Hematocrit: 31.6 % — ABNORMAL LOW (ref 34.7–43.7)
Hgb: 9.3 g/dL — ABNORMAL LOW (ref 11.4–14.8)
Immature Granulocytes Absolute: 0.03 10*3/uL (ref 0.00–0.07)
Immature Granulocytes: 0.4 %
Lymphocytes Absolute Automated: 1.09 10*3/uL (ref 0.42–3.22)
Lymphocytes Automated: 13.3 %
MCH: 29 pg (ref 25.1–33.5)
MCHC: 29.4 g/dL — ABNORMAL LOW (ref 31.5–35.8)
MCV: 98.4 fL — ABNORMAL HIGH (ref 78.0–96.0)
MPV: 11.7 fL (ref 8.9–12.5)
Monocytes Absolute Automated: 0.58 10*3/uL (ref 0.21–0.85)
Monocytes: 7.1 %
Neutrophils Absolute: 6.44 10*3/uL — ABNORMAL HIGH (ref 1.10–6.33)
Neutrophils: 78.3 %
Nucleated RBC: 0 /100 WBC (ref 0.0–0.0)
Platelets: 125 10*3/uL — ABNORMAL LOW (ref 142–346)
RBC: 3.21 10*6/uL — ABNORMAL LOW (ref 3.90–5.10)
RDW: 15 % (ref 11–15)
WBC: 8.22 10*3/uL (ref 3.10–9.50)

## 2020-01-19 LAB — GFR: EGFR: 4.5

## 2020-01-19 LAB — COMPREHENSIVE METABOLIC PANEL
ALT: 11 U/L (ref 0–55)
AST (SGOT): 6 U/L (ref 5–34)
Albumin/Globulin Ratio: 0.6 — ABNORMAL LOW (ref 0.9–2.2)
Albumin: 2.8 g/dL — ABNORMAL LOW (ref 3.5–5.0)
Alkaline Phosphatase: 122 U/L — ABNORMAL HIGH (ref 37–106)
Anion Gap: 14 (ref 5.0–15.0)
BUN: 73 mg/dL — ABNORMAL HIGH (ref 7.0–19.0)
Bilirubin, Total: 0.5 mg/dL (ref 0.2–1.2)
CO2: 17 mEq/L — ABNORMAL LOW (ref 22–29)
Calcium: 8.2 mg/dL — ABNORMAL LOW (ref 8.5–10.5)
Chloride: 104 mEq/L (ref 100–111)
Creatinine: 10.4 mg/dL — ABNORMAL HIGH (ref 0.6–1.0)
Globulin: 4.4 g/dL — ABNORMAL HIGH (ref 2.0–3.6)
Glucose: 329 mg/dL — ABNORMAL HIGH (ref 70–100)
Potassium: 5.4 mEq/L — ABNORMAL HIGH (ref 3.5–5.1)
Protein, Total: 7.2 g/dL (ref 6.0–8.3)
Sodium: 135 mEq/L — ABNORMAL LOW (ref 136–145)

## 2020-01-19 LAB — HEMOLYSIS INDEX: Hemolysis Index: 1 (ref 0–18)

## 2020-01-19 LAB — TROPONIN I: Troponin I: 0.06 ng/mL — ABNORMAL HIGH (ref 0.00–0.05)

## 2020-01-19 MED ORDER — CLONIDINE HCL 0.1 MG PO TABS
0.2000 mg | ORAL_TABLET | Freq: Once | ORAL | Status: AC
Start: 2020-01-19 — End: 2020-01-20
  Administered 2020-01-20: 0.2 mg via ORAL
  Filled 2020-01-19: qty 2

## 2020-01-19 MED ORDER — NIFEDIPINE ER OSMOTIC RELEASE 90 MG PO TB24
90.0000 mg | ORAL_TABLET | Freq: Once | ORAL | Status: DC
Start: 2020-01-19 — End: 2020-01-19

## 2020-01-19 MED ORDER — NIFEDIPINE ER OSMOTIC RELEASE 30 MG PO TB24
60.0000 mg | ORAL_TABLET | Freq: Once | ORAL | Status: AC
Start: 2020-01-19 — End: 2020-01-20
  Administered 2020-01-20: 60 mg via ORAL
  Filled 2020-01-19: qty 2

## 2020-01-19 MED ORDER — LABETALOL HCL 5 MG/ML IV SOLN (WRAP)
10.0000 mg | Freq: Once | INTRAVENOUS | Status: DC
Start: 2020-01-19 — End: 2020-01-19

## 2020-01-19 MED ORDER — HYDRALAZINE HCL 20 MG/ML IJ SOLN
10.0000 mg | Freq: Once | INTRAMUSCULAR | Status: AC
Start: 2020-01-19 — End: 2020-01-19
  Administered 2020-01-19: 23:00:00 10 mg via INTRAVENOUS
  Filled 2020-01-19: qty 1

## 2020-01-19 NOTE — ED Provider Notes (Signed)
EMERGENCY DEPARTMENT HISTORY AND PHYSICAL EXAM      Date: 01/19/2020  Patient Name: [name]    History of Presenting Illness     Chief Complaint   Patient presents with    Wrist Pain    Shortness of Breath       History Provided By: Patient    Additional History: [name] is a 64 y.o. female h/o copd, dm, hld, htn, esrd, missed hd presenting to the ED with wrist pain and shortness of breath. For the last 2 days she has had a constant sharp pain in her right wrist. She has had numbness and tingling in her fingers. She denies any accidents, trauma, falls. She has no swelling or erythema or warmth of the wrist. In addition, over the last 2 weeks she has had shortness of breath. She has missed her last 2 sessions of dialysis. She was told she cannot go to her regular dialysis center to HD because she was recently diagnosed with COVID and has to clear quarantine period. She denies any chest pain or dizziness or lightheadedness.       PCP: Pcp, None, MD  SPECIALISTS:    Current Facility-Administered Medications   Medication Dose Route Frequency Provider Last Rate Last Admin    acetaminophen (TYLENOL) tablet 650 mg  650 mg Oral Q6H PRN Emeline Darling A, PA        amLODIPine (NORVASC) tablet 10 mg  10 mg Oral Daily Emeline Darling A, PA   10 mg at 01/20/20 0829    atorvastatin (LIPITOR) tablet 40 mg  40 mg Oral Daily Eveline Keto, PA   40 mg at 01/20/20 6314    bisacodyl (DULCOLAX) EC tablet 5 mg  5 mg Oral Daily PRN Emeline Darling A, PA        dextrose (GLUCOSE) 40 % oral gel 15 g of glucose  15 g of glucose Oral PRN Emeline Darling A, PA        And    dextrose 50 % bolus 12.5 g  12.5 g Intravenous PRN Emeline Darling A, PA        And    glucagon (rDNA) (GLUCAGEN) injection 1 mg  1 mg Intramuscular PRN Emeline Darling A, PA        gabapentin (NEURONTIN) capsule 600 mg  600 mg Oral TID Lisa Roca, MD   600 mg at 01/20/20 1704    heparin (porcine) injection 5,000 Units   5,000 Units Subcutaneous Q8H [place] Folkner, Kathryn A, PA   5,000 Units at 01/20/20 1704    insulin glargine (LANTUS) injection 20 Units  20 Units Subcutaneous QHS Folkner, Kathryn A, PA        insulin lispro (HumaLOG) injection 1-5 Units  1-5 Units Subcutaneous TID AC Folkner, Kathryn A, PA   2 Units at 01/20/20 1315    lidocaine (LMX) 4 % cream   Topical PRN Emeline Darling A, PA   5 g at 01/20/20 9702    melatonin tablet 3 mg  3 mg Oral QHS PRN Eveline Keto, PA        naloxone [facility]) injection 0.2 mg  0.2 mg Intravenous PRN Emeline Darling A, PA        nystatin (NYSTOP) powder 1 application  1 application Topical BID PRN Emeline Darling A, PA        ondansetron (ZOFRAN) injection 4 mg  4 mg Intravenous Once PRN Shea Evans, MD  ondansetron (ZOFRAN-ODT) disintegrating tablet 4 mg  4 mg Oral Q6H PRN Emeline Darling A, PA        Or    ondansetron (ZOFRAN) injection 4 mg  4 mg Intravenous Q6H PRN Folkner, Kathryn A, PA        pantoprazole (PROTONIX) EC tablet 40 mg  40 mg Oral QAM AC Folkner, Kathryn A, PA   40 mg at 01/20/20 2094    sevelamer (RENVELA) tablet 800 mg  800 mg Oral TID MEALS Chinery, Roger C, MD   800 mg at 01/20/20 1704    sodium chloride 0.9 % bolus 100 mL  100 mL Intravenous Q1H PRN Verl Blalock, MD        sodium chloride 0.9 % bolus 250 mL  250 mL Intravenous PRN Verl Blalock, MD           Past History     Past Medical History:  Past Medical History:   Diagnosis Date    Chronic obstructive pulmonary disease     Diabetes mellitus     Hyperlipidemia     Hypertension     Sleep apnea 2018       Past Surgical History:  Past Surgical History:   Procedure Laterality Date    EGD, BIOPSY N/A 05/14/2019    Procedure: EGD, BIOPSY;  Surgeon: Ronie Spies, MD;  Location: [facility];  Service: Gastroenterology;  Laterality: N/A;    EXPLORATORY LAPAROTOMY N/A 05/27/2019    Procedure: EXPLORATORY LAPAROTOMY;  Surgeon: Herbert Moors., MD;   Location: [facility];  Service: General;  Laterality: N/A;    JOINT REPLACEMENT      shoulder    JOINT REPLACEMENT      knee    OVARY SURGERY Left     Removal    REMOVAL, FOREIGN BODY N/A 05/27/2019    Procedure: REMOVAL, PERITONEAL DIALYSIS CATHETER;  Surgeon: Herbert Moors., MD;  Location: [facility];  Service: General;  Laterality: N/A;    TUNNELED CATH CHECK/CHANGE (PERMCATH) N/A 07/03/2019    Procedure: TUNNELED CATH CHECK/CHANGE;  Surgeon: Maureen Ralphs, MD;  Location: [facility];  Service: Interventional Radiology;  Laterality: N/A;    TUNNELED CATH PLACEMENT (PERMCATH) Bilateral 05/23/2019    Procedure: TUNNELED CATH PLACEMENT;  Surgeon: Jacques Earthly, MD;  [facility]: [facility];  Service: Interventional Radiology;  Laterality: Bilateral;    TUNNELED CATH PLACEMENT (PERMCATH) Right 07/31/2019    Procedure: TUNNELED CATH PLACEMENT;  Surgeon: Jacques Earthly, MD;  Location: [facility];  Service: Interventional Radiology;  Laterality: Right;       Family History:  Family History   Problem Relation Age of Onset    Diabetes Mother     Hypertension Mother     Diabetes Father     Hypertension Father     Diabetes Sister     Hypertension Sister     Diabetes Brother     Hypertension Brother     Diabetes Paternal Aunt     Diabetes Paternal Uncle        Social History:  Social History     Tobacco Use    Smoking status: Never Smoker    Smokeless tobacco: Never Used   Brewing technologist Use: Never used   Substance Use Topics    Alcohol use: Never    Drug use: Never       Allergies:  Allergies   Allergen Reactions    Lisinopril Hives  Metformin Hives       Review of Systems     Review of Systems   Constitutional: Negative for chills, fatigue and fever.   HENT: Negative for congestion, rhinorrhea and sore throat.    Respiratory: Positive for shortness of breath. Negative for cough and chest tightness.    Cardiovascular: Negative for chest pain, palpitations and leg swelling.    Gastrointestinal: Negative for abdominal distention, abdominal pain, diarrhea, nausea and vomiting.   Musculoskeletal: Negative for back pain, joint swelling, myalgias and neck pain.        Wrist pain    Skin: Negative for color change, pallor and rash.   Neurological: Negative for dizziness, weakness, numbness and headaches.       Physical Exam   BP 116/75    Pulse 88    Temp 97.9 F (36.6 C)    Resp 18    Ht 5' 2.01" (1.575 m)    Wt 101.4 kg    LMP  (LMP Unknown) Comment: post menopausal   SpO2 95%    BMI 40.88 kg/m     Physical Exam  Vitals reviewed.   Constitutional:       General: She is not in acute distress.     Appearance: She is well-developed. She is not ill-appearing, toxic-appearing or diaphoretic.   HENT:      Head: Normocephalic and atraumatic.      Mouth/Throat:      Mouth: No oral lesions.      Tonsils: No tonsillar exudate or tonsillar abscesses.   Eyes:      Extraocular Movements: Extraocular movements intact.      Conjunctiva/sclera: Conjunctivae normal.   Cardiovascular:      Rate and Rhythm: Normal rate and regular rhythm.   Pulmonary:      Effort: Pulmonary effort is normal. No respiratory distress.      Breath sounds: No stridor.   Abdominal:      General: There is no distension.      Palpations: Abdomen is soft.      Tenderness: There is no abdominal tenderness.   Musculoskeletal:      Cervical back: Normal range of motion and neck supple.      Comments: Right wrist: no obvious deformity, edema, erythema. TTP diffusely. ROM intact. Sensation in tact. +2 radial pulse.    Skin:     General: Skin is warm and dry.      Capillary Refill: Capillary refill takes less than 2 seconds.      Coloration: Pallor:    Neurological:      General: No focal deficit present.      Mental Status: She is alert and oriented to person, place, and time.   Psychiatric:         Mood and Affect: Mood normal.         Behavior: Behavior normal.           Diagnostic Study Results     Labs -     Results     Procedure  Component Value Units Date/Time    Glucose Whole Blood - POCT [[id number]]  (Abnormal) Collected: 01/20/20 1532     Updated: 01/20/20 1543     Whole Blood Glucose POCT 171 mg/dL     Glucose Whole Blood - POCT [[id number]]  (Abnormal) Collected: 01/20/20 1145     Updated: 01/20/20 1156     Whole Blood Glucose POCT 217 mg/dL     Glucose Whole Blood - POCT [[id number]]  (  Abnormal) Collected: 01/20/20 0721     Updated: 01/20/20 0808     Whole Blood Glucose POCT 262 mg/dL     COVID-19 (SARS-COV-2) ([place] [facility]) [[id number]] Collected: 01/20/20 0013    Specimen: Nasopharyngeal Swab from Nasopharynx Updated: 01/20/20 0049     Purpose of COVID testing Screening     SARS-CoV-2 Specimen Source Nasopharyngeal     SARS CoV 2 Overall Result Negative    Narrative:      o Collect and clearly label specimen type:  o Upper respiratory specimen: One Nasopharyngeal Dry Swab NO  Transport Media.  o Hand deliver to laboratory ASAP  Indication for testing->Extended care facility admission to  semi private room  Screening    Troponin I [[id number]]  (Abnormal) Collected: 01/19/20 2202     Updated: 01/19/20 2232     Troponin I 0.06 ng/mL     Comprehensive metabolic panel [[id number]]  (Abnormal) Collected: 01/19/20 2202    Specimen: Blood Updated: 01/19/20 2226     Glucose 329 mg/dL      BUN 73.0 mg/dL      Creatinine 10.4 mg/dL      Sodium 135 mEq/L      Potassium 5.4 mEq/L      Chloride 104 mEq/L      CO2 17 mEq/L      Calcium 8.2 mg/dL      Protein, Total 7.2 g/dL      Albumin 2.8 g/dL      AST (SGOT) 6 U/L      ALT 11 U/L      Alkaline Phosphatase 122 U/L      Bilirubin, Total 0.5 mg/dL      Globulin 4.4 g/dL      Albumin/Globulin Ratio 0.6     Anion Gap 14.0    Hemolysis index [[id number]] Collected: 01/19/20 2202     Updated: 01/19/20 2226     Hemolysis Index 1    GFR [[id number]] Collected: 01/19/20 2202     Updated: 01/19/20 2226     EGFR 4.5    CBC and differential [[id number]]  (Abnormal) Collected: 01/19/20 2202    Specimen: Blood  Updated: 01/19/20 2210     WBC 8.22 x10 3/uL      Hgb 9.3 g/dL      Hematocrit 31.6 %      Platelets 125 x10 3/uL      RBC 3.21 x10 6/uL      MCV 98.4 fL      MCH 29.0 pg      MCHC 29.4 g/dL      RDW 15 %      MPV 11.7 fL      Neutrophils 78.3 %      Lymphocytes Automated 13.3 %      Monocytes 7.1 %      Eosinophils Automated 0.7 %      Basophils Automated 0.2 %      Immature Granulocytes 0.4 %      Nucleated RBC 0.0 /100 WBC      Neutrophils Absolute 6.44 x10 3/uL      Lymphocytes Absolute Automated 1.09 x10 3/uL      Monocytes Absolute Automated 0.58 x10 3/uL      Eosinophils Absolute Automated 0.06 x10 3/uL      Basophils Absolute Automated 0.02 x10 3/uL      Immature Granulocytes Absolute 0.03 x10 3/uL      Absolute NRBC 0.00 x10 3/uL  Radiologic Studies -   Radiology Results (24 Hour)     Procedure Component Value Units Date/Time    Wrist Right PA Lateral And Oblique [[id number]] Collected: 01/19/20 1903    Order Status: Completed Updated: 01/19/20 1907    Narrative:      INDICATION: RT wrist pain, limited ROM rlo fx    TECHNIQUE: 4 views of the right wrist    COMPARISON: None.      Impression:      FINDINGS/ There is no evidence of fracture, dislocation, or  other acute osseous abnormality. 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  Arthritic changes noted in the first metacarpophalangeal joint. The  carpal rows are congruent. Vascular calcifications throughout the wrist  and forearm are present. Soft tissue edema is present in the medial and  dorsal wrist.    Earney Navy, MD   01/19/2020 7:05 PM    XR Chest 2 Views [[id number]] Collected: 01/19/20 1901    Order Status: Completed Updated: 01/19/20 1904    Narrative:      INDICATION: SOB x 1 day    TECHNIQUE: PA and lateral radiographs of the chest.    COMPARISON: Chest radiograph dated 12/31/2019.    FINDINGS: Diffuse interstitial opacities are present, similar to  prior. Previously seen airspace opacities are no longer present. There  is no evidence of  pleural effusion or pneumothorax. There is no  cardiomegaly. The upper abdomen demonstrates no acute abnormality. A  left shoulder arthroplasty is again noted.      Impression:          Diffuse interstitial opacities are present, similar to prior. Previously  seen airspace opacities are no longer present.    Earney Navy, MD   01/19/2020 7:02 PM      .    Medical Decision Making   I am the first provider for this patient.    I reviewed the vital signs, available nursing notes, past medical history, past surgical history, family history and social history.    Vital Signs-Reviewed the patient's vital signs.     Patient Vitals for the past 12 hrs:   BP Temp Pulse Resp   01/20/20 1615 116/75 97.9 F (36.6 C) 88 18   01/20/20 1550 106/66 97.7 F (36.5 C) 92 17   01/20/20 1545 127/78  92 16   01/20/20 1530 113/69  93 18   01/20/20 1515 117/70  86 16   01/20/20 1500 120/79  90 18   01/20/20 1445 132/75  83 17   01/20/20 1430 113/71  83 16   01/20/20 1415 114/74  87 16   01/20/20 1400 129/77  84 17   01/20/20 1345 150/76  82 16   01/20/20 1330 137/76  83 18   01/20/20 1315 158/82  88 16   01/20/20 1300 129/75  79 16   01/20/20 1245 138/84  81 18   01/20/20 1230 145/89  80 17   01/20/20 1215 150/78  79 18   01/20/20 1200 133/75 97.9 F (36.6 C) 80 18   01/20/20 1150 140/78 97.9 F (36.6 C)  16   01/20/20 1147 (!) 150/96 97.5 F (36.4 C) 88 17   01/20/20 0829 131/73      01/20/20 0726 138/82 97.9 F (36.6 C) 80 17       Pulse Oximetry Analysis - Normal 96% on RA      EKG:  Interpreted by the EP.   Time Interpreted: 1830   Rate: 92   Rhythm:  Normal Sinus Rhythm, no axis deviation   Interpretation: no ST elevation or depression   Comparison: No acute change compared to ECG from 12/31/2019     Procedures:  Splint Application    Date/Time: 01/20/2020 5:38 PM  Performed by: Shea Evans, MD  Authorized by: Lisa Roca, MD     Consent:     Consent obtained:  Verbal    Consent given by:  Patient     Risks discussed:  Discoloration, numbness, pain and swelling    Alternatives discussed:  No treatment  Pre-procedure details:     Sensation:  Normal  Procedure details:     Laterality:  Right    Location:  Wrist    Wrist:  R wrist    Splint type:  Wrist  Post-procedure details:     Pain:  Unchanged    Sensation:  Normal    Patient tolerance of procedure:  Tolerated well, no immediate complications        Clinical Decision Support:  (any stroke patients need an NIH Stroke Score and ABCD2 score, any chest pain patients need a Heart Score)        Old Medical Records: Old medical records.  Previous electrocardiograms.  Nursing notes.     ED Course:   ED Course as of 01/20/20 1739   Mon Jan 19, 2020   2215 Glucose elevated to 329. BUN is 73. Cr is 10. K is 5.4. No acute ECG changes. Troponin elevated to 0.06. BP elevated. Pending IV hydralazine.  [VG]   2336 Dr. Eddie Dibbles [facility] of Nephrology contacted. He recommended clonidine and BP management. They will dialyze in the morning.    [VG]   2358 Discussed with sound hospitalist and will place in observation in IMCU [VG]      ED Course User Index  [VG] Shea Evans, MD       Provider Notes: 64 y/o F presents to the ED for wrist pain and shortness of breath. In triage, the patient had a CXR and right wrist x-rays ordered. Right wrist x-ray unremarkable for fracture. Higher suspicion for carpal tunnel syndrome. Plan to place patient in right wrist splint. CXR reveals infiltrates consistent with her last CXR from 2 weeks ago. Differential diagnosis for shortness of breath includes COVID, pneumonia, CHF, hypertensive urgency. Plan to check CBC, CMP, troponin, ECG.     Critical Care Time:    For Hospitalized Patients:    1. Hospitalization Decision Time:  The decision to admit this patient was made by the emergency provider at 2328[time] on 01/19/2020       Diagnosis     Clinical Impression:   1. Hypertensive urgency    2. Right wrist pain    3. Shortness of breath         Treatment Plan:   ED Disposition     ED Disposition Condition Date/Time Comment    Observation  Mon Jan 19, 2020 11:58 PM Admitting Physician: Sheppard Coil [[id number]]   Service:: Medicine [[id number]]   Estimated Length of Stay: < 2 midnights   Tentative Discharge Plan?: Home or Self Care [1]   Does patient need telemetry?: Yes   Telemetry type (separate Telemetry order is also required):: Cardiac telemetry              _______________________________      Attestations: This note is prepared by Thalia Bloodgood, MD.    _______________________________     Shea Evans, MD  01/20/20 1739

## 2020-01-19 NOTE — EDIE (Signed)
COLLECTIVE?NOTIFICATION?01/19/2020 16:49?[name], [name]?MRN: [id number]    Criteria Met      5 ED Visits in 12 Months    COVID-19 Positive Lab Results    Security and Safety  No recent Security Events currently on file    ED Care Guidelines  There are currently no ED Care Guidelines for this patient. Please check your facility's medical records system.    Flags      Positive COVID-19 Lab Result - [facility] - A specimen collected from this patient was positive for COVID-19 / Attributed By: [place] [facility] / Attributed On: 01/02/2020       Prescription Monitoring Program  300??- Narcotic Use Score  130??- Sedative Use Score  000??- Stimulant Use Score  180??- Overdose Risk Score  - All Scores range from 000-999 with 75% of the population scoring < 200 and on 1% scoring above 650  - The last digit of the narcotic, sedative, and stimulant score indicates the number of active prescriptions of that type  - Higher Use scores correlate with increased prescribers, pharmacies, mg equiv, and overlapping prescriptions  - Higher Overdose Risk Scores correlate with increased risk of unintentional overdose death   Concerning or unexpectedly high scores should prompt a review of the PMP record; this does not constitute checking PMP for prescribing purposes.      E.D. Visit Count (12 mo.)  Facility Visits   [facility] 7   Total 11   Note: Visits indicate total known visits.     Recent Emergency Department Visit Summary  Showing 10 most recent visits out of 11 in the past 12 months  Date [place] Type Diagnoses or Chief Complaint   Jan 19, 2020 [place]      Missed dialysis; R arm pain( COVID+)      Dec 31, 2019 [place] [facility]      difficulty breathing      Shortness of Breath      COVID-19      Pneumonia due to coronavirus disease 2019      Sep 29, 2019 [place] [place] [place]  [facility]      Fall/Leg Pain/Hyperglycemia      Fall      Aug 16, 2019 [place] [place] [place] [facility]      Vomiting      Abdominal Pain      Aug 14, 2019 [place] [place] [facility]      N/V/D      Diarrhea      Diarrhea, unspecified      End stage renal disease      Dependence on renal dialysis      Breakdown (mechanical) of vascular dialysis catheter, initial encounter      Aug 04, 2019 [place]. [place] [place] [facility]      pain      Chest Pain      Jul 20, 2019 [place]      Generalized Body Aches      Chest Pain      Shortness of breath      Jun 30, 2019 [place]      dizziness, AMS      Dizziness      Fatigue  Fall      Dizziness and giddiness      Jun 23, 2019 [place] [place] [facility]      Numbness      Arm Pain      Dependence on renal dialysis      End stage renal disease      Cerebral infarction, unspecified      Anesthesia of skin      May 27, 2019 [place]      abd pain      Abdominal Pain      Diarrhea, unspecified      Generalized abdominal pain      Partial intestinal obstruction, unspecified as to cause      Dependence on renal dialysis      End stage renal disease      Vomiting, unspecified          Recent Inpatient Visit Summary  Date [place] Type Diagnoses or Chief Complaint   Dec 31, 2019 [place]      Dependence on renal dialysis      Shortness of breath      Pneumonia due to coronavirus disease 2019      COVID-19      End stage renal disease      Other specified abnormalities of plasma proteins      Sep 03, 2019 [place] [place]      Dependence on renal dialysis      End stage renal disease      Nausea with vomiting, unspecified      Resistance to multiple antibiotics      Bacterial infection, unspecified      Aug 06, 2019 [place].  [place] [place] [facility]      Other injury of unspecified body region, initial encounter      Chest pain, unspecified      Local infection of the skin and subcutaneous tissue, unspecified      Dependence on renal dialysis      End stage renal disease      Jul 20, 2019 [place]      Shortness of breath      Dizziness and giddiness      End stage renal disease      Unspecified open wound of abdominal wall, unspecified quadrant without penetration into peritoneal cavity, subsequent encounter      Dependence on renal dialysis      Jun 30, 2019 [place]      Dizziness and giddiness      End stage renal disease      Dependence on renal dialysis      Jun 23, 2019 [place]      Dependence on renal dialysis      Cerebral infarction, unspecified      End stage renal disease      Anesthesia of skin      May 27, 2019 [place]      Vomiting, unspecified      Diarrhea, unspecified      End stage renal disease      Generalized abdominal pain      Dependence on renal dialysis      Partial intestinal obstruction, unspecified as  to cause      May 12, 2019 [place]      Acute kidney failure, unspecified      Hematemesis      Breakdown (mechanical) of intraperitoneal dialysis catheter, initial encounter          Care Team  Provider Specialty Phone Fax Service Dates   Hazeline Junker Case Manager/Care Coordinator [phone]  Current    Theone Murdoch, DNP Nurse Practitioner: Family [phone] Current    PHAM, TRI M, MD Internal Medicine   Current    Horton Marshall MD, M.D. Internal Medicine [phone] Current      Collective Portal  This patient has registered at the [facility] Emergency Department   For more information visit:  [email or web address]     PLEASE NOTE:     1.   Any care recommendations and other clinical information are provided as guidelines or for historical purposes only, and providers should exercise their own clinical judgment when providing care.    2.   You may only use this information for purposes of treatment, payment or health care operations activities, and subject to the limitations of applicable [employer] Polic[employer].    3.   You should consult directly with the organization that provided a care guideline or other clinical history with any questions about additional information or accuracy or completeness of information provided.    ? 2021 [employer] - https://craig.com/

## 2020-01-19 NOTE — Plan of Care (Signed)
RENAL    Notified of patient's admission  Will plan for HD in the AM. No urgent indication at this time    I ordered 0.2 mg of clonidine now and nifedipine XL 60 mg once    Please call if questions or problems overnight    Thank you    Ayesha Mohair, MD, [place] [phone]

## 2020-01-19 NOTE — ED Triage Notes (Signed)
[facility] NOTE        Patient Name: [name]    Chief Complaint: No chief complaint on file.      HPI: CORRINNA KARAPETYAN is a 64 y.o. female, who has had a rapid medical screening evaluation initiated by myself for the chief complaint of RT wrist pain with numbness and tingling x 2 days. Reports waking up with pain. Pain is moderate to severe. Reports wearing a wrist brace she found at home. No trauma or fall. Also reports missing dialysis since last Wednesday. Also experience SOB x 1 day. No CP. Reports COVID + a few weeks ago. Uses cane and walker to move around.       ROS: Please see HPI.     Medical/Surgical/Social history: as per HPI    Vitals: BP (!) 204/96    Pulse 97    Temp 98 F (36.7 C) (Oral)    Resp 22    Ht 5\' 2"  (1.575 m)    Wt 98.4 kg    LMP  (LMP Unknown) Comment: post menopausal   SpO2 96%    BMI 39.69 kg/m     Pertinent brief exam:   Constitutional: No acute distress.Well-nourished.   Cardiovascular: Normal heart rate.   Pulmonary: Normal effort.  No respiratory distress. No stridor.  Skin: Normal skin color. No diaphoresis.  Psych: Normal affect.     Additional pertinent physical exam findings: Mod-severe TTP at RT wrist and dorsum of hand. Limited flexion or extension due to pain. Radial pulse 2+.     Preliminary orders: ekg, hand xray, cbc, cmp    Symptom based diagnosis: SOB, RT wrist pain      Patient advised to remain in the ED until further evaluation can be performed. Patient instructed to notify staff of any changes in condition while waiting.    This assessment is an initial evaluation to expedite care.

## 2020-01-19 NOTE — ED Triage Notes (Signed)
Pt. BIB husband c/o R wrist pain x 2 days.  Pt. Is Dialyzed M, W, F.  Was dialyzed last Wednesday but has not been to dialysis since then due to being sick.  Pt. Is A+Ox4, moving all extremities. Pt. Denies CP, N/V/D but c/o of some SOB.      BP (!) 204/96    Pulse 97    Temp 98 F (36.7 C) (Oral)    Resp 22    Ht 5\' 2"  (1.575 m)    Wt 98.4 kg    LMP  (LMP Unknown) Comment: post menopausal   SpO2 96%    BMI 39.69 kg/m

## 2020-01-20 ENCOUNTER — Encounter: Payer: Self-pay | Admitting: Internal Medicine

## 2020-01-20 LAB — ECG 12-LEAD
Atrial Rate: 92 {beats}/min
P Axis: 47 degrees
P-R Interval: 144 ms
Q-T Interval: 352 ms
QRS Duration: 78 ms
QTC Calculation (Bezet): 435 ms
R Axis: 62 degrees
T Axis: 70 degrees
Ventricular Rate: 92 {beats}/min

## 2020-01-20 LAB — GLUCOSE WHOLE BLOOD - POCT
Whole Blood Glucose POCT: 262 mg/dL — ABNORMAL HIGH (ref 70–100)
Whole Blood Glucose POCT: 217 mg/dL — ABNORMAL HIGH (ref 70–100)
Whole Blood Glucose POCT: 171 mg/dL — ABNORMAL HIGH (ref 70–100)
Whole Blood Glucose POCT: 307 mg/dL — ABNORMAL HIGH (ref 70–100)

## 2020-01-20 LAB — COVID-19 (SARS-COV-2): SARS CoV 2 Overall Result: NEGATIVE

## 2020-01-20 MED ORDER — ACETAMINOPHEN 325 MG PO TABS
650.0000 mg | ORAL_TABLET | Freq: Four times a day (QID) | ORAL | Status: DC | PRN
Start: 2020-01-20 — End: 2020-01-22
  Administered 2020-01-21 – 2020-01-22 (×3): 650 mg via ORAL
  Filled 2020-01-20 (×3): qty 2

## 2020-01-20 MED ORDER — SEVELAMER CARBONATE 800 MG PO TABS
800.0000 mg | ORAL_TABLET | Freq: Three times a day (TID) | ORAL | Status: DC
Start: 2020-01-20 — End: 2020-01-22
  Administered 2020-01-20 – 2020-01-22 (×6): 800 mg via ORAL
  Filled 2020-01-20 (×6): qty 1

## 2020-01-20 MED ORDER — ONDANSETRON HCL 4 MG/2ML IJ SOLN
4.0000 mg | Freq: Once | INTRAMUSCULAR | Status: DC | PRN
Start: 2020-01-20 — End: 2020-01-22

## 2020-01-20 MED ORDER — DEXTROSE 50 % IV SOLN
12.5000 g | INTRAVENOUS | Status: DC | PRN
Start: 2020-01-20 — End: 2020-01-22

## 2020-01-20 MED ORDER — MELATONIN 3 MG PO TABS
3.0000 mg | ORAL_TABLET | Freq: Every evening | ORAL | Status: DC | PRN
Start: 2020-01-20 — End: 2020-01-22

## 2020-01-20 MED ORDER — ALBUMIN HUMAN 25 % IV SOLN
100.0000 mL | INTRAVENOUS | Status: AC | PRN
Start: 2020-01-20 — End: 2020-01-20

## 2020-01-20 MED ORDER — ONDANSETRON 4 MG PO TBDP
4.0000 mg | ORAL_TABLET | Freq: Four times a day (QID) | ORAL | Status: DC | PRN
Start: 2020-01-20 — End: 2020-01-22

## 2020-01-20 MED ORDER — SODIUM CHLORIDE 0.9 % IV BOLUS
250.0000 mL | INTRAVENOUS | Status: AC | PRN
Start: 2020-01-20 — End: 2020-01-20

## 2020-01-20 MED ORDER — SODIUM CHLORIDE 0.9 % IV BOLUS
100.0000 mL | INTRAVENOUS | Status: AC | PRN
Start: 2020-01-20 — End: 2020-01-20

## 2020-01-20 MED ORDER — ATORVASTATIN CALCIUM 40 MG PO TABS
40.0000 mg | ORAL_TABLET | Freq: Every day | ORAL | Status: DC
Start: 2020-01-20 — End: 2020-01-22
  Administered 2020-01-20 – 2020-01-22 (×3): 40 mg via ORAL
  Filled 2020-01-20 (×3): qty 1

## 2020-01-20 MED ORDER — BISACODYL 5 MG PO TBEC
5.0000 mg | DELAYED_RELEASE_TABLET | Freq: Every day | ORAL | Status: DC | PRN
Start: 2020-01-20 — End: 2020-01-22

## 2020-01-20 MED ORDER — INSULIN GLARGINE 100 UNIT/ML SC SOLN
20.0000 [IU] | Freq: Every evening | SUBCUTANEOUS | Status: DC
Start: 2020-01-20 — End: 2020-01-22
  Administered 2020-01-20 – 2020-01-21 (×2): 20 [IU] via SUBCUTANEOUS
  Filled 2020-01-20 (×3): qty 20

## 2020-01-20 MED ORDER — PANTOPRAZOLE SODIUM 40 MG PO TBEC
40.0000 mg | DELAYED_RELEASE_TABLET | Freq: Every morning | ORAL | Status: DC
Start: 2020-01-20 — End: 2020-01-22
  Administered 2020-01-20 – 2020-01-22 (×3): 40 mg via ORAL
  Filled 2020-01-20 (×3): qty 1

## 2020-01-20 MED ORDER — NYSTATIN 100000 UNIT/GM EX POWD
1.0000 | Freq: Two times a day (BID) | CUTANEOUS | Status: DC | PRN
Start: 2020-01-20 — End: 2020-01-22
  Administered 2020-01-22: 1 via TOPICAL
  Filled 2020-01-20 (×2): qty 15

## 2020-01-20 MED ORDER — AMLODIPINE BESYLATE 5 MG PO TABS
10.0000 mg | ORAL_TABLET | Freq: Every day | ORAL | Status: DC
Start: 2020-01-20 — End: 2020-01-22
  Administered 2020-01-20 – 2020-01-22 (×2): 10 mg via ORAL
  Filled 2020-01-20 (×3): qty 2

## 2020-01-20 MED ORDER — NALOXONE HCL 0.4 MG/ML IJ SOLN (WRAP)
0.2000 mg | INTRAMUSCULAR | Status: DC | PRN
Start: 2020-01-20 — End: 2020-01-22

## 2020-01-20 MED ORDER — HEPARIN SODIUM (PORCINE) 5000 UNIT/ML IJ SOLN
5000.0000 [IU] | Freq: Three times a day (TID) | INTRAMUSCULAR | Status: DC
Start: 2020-01-20 — End: 2020-01-22
  Administered 2020-01-20 – 2020-01-22 (×7): 5000 [IU] via SUBCUTANEOUS
  Filled 2020-01-20 (×9): qty 1

## 2020-01-20 MED ORDER — GLUCAGON 1 MG IJ SOLR (WRAP)
1.0000 mg | INTRAMUSCULAR | Status: DC | PRN
Start: 2020-01-20 — End: 2020-01-22

## 2020-01-20 MED ORDER — INSULIN LISPRO 100 UNIT/ML SC SOLN
1.0000 [IU] | Freq: Three times a day (TID) | SUBCUTANEOUS | Status: DC
Start: 2020-01-20 — End: 2020-01-22
  Administered 2020-01-20: 09:00:00 3 [IU] via SUBCUTANEOUS
  Administered 2020-01-20: 13:00:00 2 [IU] via SUBCUTANEOUS
  Administered 2020-01-21: 18:00:00 3 [IU] via SUBCUTANEOUS
  Administered 2020-01-21: 09:00:00 1 [IU] via SUBCUTANEOUS
  Administered 2020-01-21 – 2020-01-22 (×2): 3 [IU] via SUBCUTANEOUS
  Administered 2020-01-22: 08:00:00 1 [IU] via SUBCUTANEOUS
  Filled 2020-01-20: qty 6
  Filled 2020-01-20 (×3): qty 9
  Filled 2020-01-20: qty 3
  Filled 2020-01-20: qty 9
  Filled 2020-01-20: qty 3
  Filled 2020-01-20: qty 9

## 2020-01-20 MED ORDER — GABAPENTIN 300 MG PO CAPS
600.0000 mg | ORAL_CAPSULE | Freq: Three times a day (TID) | ORAL | Status: DC
Start: 2020-01-20 — End: 2020-01-22
  Administered 2020-01-20 – 2020-01-22 (×6): 600 mg via ORAL
  Filled 2020-01-20 (×7): qty 2

## 2020-01-20 MED ORDER — LIDOCAINE 4 % EX CREA
TOPICAL_CREAM | CUTANEOUS | Status: DC | PRN
Start: 2020-01-20 — End: 2020-01-22
  Administered 2020-01-20: 09:00:00 5 g via TOPICAL
  Filled 2020-01-20: qty 5

## 2020-01-20 MED ORDER — GLUCOSE 40 % PO GEL
15.0000 g | ORAL | Status: DC | PRN
Start: 2020-01-20 — End: 2020-01-22

## 2020-01-20 MED ORDER — ACETAMINOPHEN 325 MG PO TABS
650.0000 mg | ORAL_TABLET | Freq: Once | ORAL | Status: AC | PRN
Start: 2020-01-20 — End: 2020-01-20
  Administered 2020-01-20: 650 mg via ORAL
  Filled 2020-01-20: qty 2

## 2020-01-20 MED ORDER — ONDANSETRON HCL 4 MG/2ML IJ SOLN
4.0000 mg | Freq: Four times a day (QID) | INTRAMUSCULAR | Status: DC | PRN
Start: 2020-01-20 — End: 2020-01-22

## 2020-01-20 NOTE — ED Notes (Addendum)
[facility]  ED NURSING NOTE FOR THE RECEIVING INPATIENT NURSE   ED NURSE caity   [place] [phone]   ED CHARGE RN [phone]   ADMISSION INFORMATION   [name] is a 64 y.o. female admitted with a diagnosis of:    1. Hypertensive urgency         Isolation: None   Allergies: Lisinopril and Metformin   Holding Orders confirmed? Yes   Belongings Documented? Yes   Home medications sent to pharmacy confirmed? No   NURSING CARE   Patient Comes From:   Mental Status: Home/Family Care  alert and oriented   ADL: Needs assistance with ADLs   Ambulation: ambulates with: walker or uses wheelchair   Pertinent Information  and Safety Concerns: R wrist splint- carpal tunnel. Fistula LUE   Dialysis patient     COVID Test sent to lab? Yes   VITAL SIGNS   Time BP Temp Pulse Resp SpO2   0100 185/80 36.7 94 21 97% RA   CT / NIH   CT Head ordered on this patient?  No   NIH/Dysphagia assessment done prior to admission? No   PERSONAL PROTECTIVE EQUIPMENT   Gloves   LAB RESULTS   Labs Reviewed   CBC AND DIFFERENTIAL - Abnormal; Notable for the following components:       Result Value    Hgb 9.3 (*)     Hematocrit 31.6 (*)     Platelets 125 (*)     RBC 3.21 (*)     MCV 98.4 (*)     MCHC 29.4 (*)     Neutrophils Absolute 6.44 (*)     All other components within normal limits   COMPREHENSIVE METABOLIC PANEL - Abnormal; Notable for the following components:    Glucose 329 (*)     BUN 73.0 (*)     Creatinine 10.4 (*)     Sodium 135 (*)     Potassium 5.4 (*)     CO2 17 (*)     Calcium 8.2 (*)     Albumin 2.8 (*)     Alkaline Phosphatase 122 (*)     Globulin 4.4 (*)     Albumin/Globulin Ratio 0.6 (*)     All other components within normal limits   TROPONIN I - Abnormal; Notable for the following components:    Troponin I 0.06 (*)     All other components within normal limits   COVID-19 (SARS-COV-2)    Narrative:     o Collect and clearly label specimen type:  o Upper respiratory specimen: One Nasopharyngeal Dry Swab  NO  Transport Media.  o Hand deliver to laboratory ASAP  Indication for testing->Extended care facility admission to  semi private room  Screening   HEMOLYSIS INDEX   GFR          Ticket to Ride Printed: Yes

## 2020-01-20 NOTE — Progress Notes (Signed)
01/20/20 1500   Volunteer Chaplain   Visit Type Initial   Source Physician Referral   Present at Visit patient;Technician;Nurse;Physician   Spiritual Care Provided to patient only   Reason for Request Spiritual Support;Spiritual Assessment   Spiritual Care Interventions Provided silent and supportive presence   Spiritual Care Outcomes Unavailable   Length of Visit 0-15 minutes   Follow-up Follow-up routine (3)

## 2020-01-20 NOTE — Progress Notes (Signed)
Oxygen Weaning    Stable to wean?Pt is on Room air    Attempted to wean?NA    Oxygen saturation at restmaintained at 94-97 %while on room air     Symptoms at rest include: NONE     Oxygen saturation with ambulation: Needs elevaution     Symptoms with ambulation include: UTA    Patient self-proning?No, pt was in pain and enable to do so.    Patient using incentive spirometer?Yes    Amount achieved on incentive spirometer?[place]

## 2020-01-20 NOTE — Progress Notes (Signed)
Pt safely transferred to unit  from ED at  1:43 Am. Vital sign BP 161/76, HR 93  Pt id band verified and oriented to unit. External catheter in place and positioned well without the cannister. IV  dressing, clean, dry, and intact. SCDs on as DVT prophylaxis. Educated on IS use. Pt educated on importance of call bell use for pain and toileting needs. Pt belongings in room. All moderate fall risk precautions in place.

## 2020-01-20 NOTE — UM Notes (Addendum)
01/19/20 2358  Admit to Observation  Once        Diagnosis: Hypertensive Urgency    Level of Care: Intermediate Care    Patient Class: Observation              H&P: 64 y.o. female with a PMHx of ESRD on HD, COVID-19 pneumonia (dx on 12/31/19), COPD, DM, HTN, and carpal tunnel syndrome who presented to [facility] ED on 01/19/20 for complaint of right wrist pain x 2 days with associated numbness and tingling in her fingers. Denies recent trauma. She also endorses worsening shortness of breath for the past two weeks and has missed her last two dialysis sessions. She was recently admitted to [facility] from 12/31/19-01/08/20 for COVID pneumonia requiring supplemental O2 with course complicated by C.diff intractable to oral vancomycin and ultimately required Dificid. Outpatient HD was arranged during her COVID quarantine period until she could return to her regular dialysis center.     In ED, workup was notable for mildly elevated troponin at 0.06, hypertensive urgency, hyperglycemia and hyperkalemia. BP initially 204/96. Nephrology consulted who ordered clonidine and nifedipine with plans for dialysis in the morning. BP currently improved to 161/76. CXR revealed diffuse interstitial opacities without previously seen airspace opacities.  Right wrist XR was without acute fracture, dislocation, or osseous abnormality. Wright wrist placed in splint for suspected carpal tunnel syndrome. She was admitted for volume overload secondary to missed dialysis with plans for urgent dialysis.     ED VS: on RA SpO2 94 %  01/20/20 0020 -- 110(Abnormal)  -- -- 213/91(Abnormal)    01/19/20 2300 -- 105(Abnormal)  100 % 20 213/91(Abnormal)    01/19/20 2245 -- 97 97 % 20 222/100(Abnormal)    01/19/20 2205 -- 84 100 % 22 193/82   01/19/20 1819 98 F (36.7 C) 97 96 % 22 204/96(Abnormal)        ED Labs:  Results     Procedure Component Value Units Date/Time    Glucose Whole Blood - POCT [[id number]]  (Abnormal) Collected: 01/20/20 1145      Updated: 01/20/20 1156     Whole Blood Glucose POCT 217 mg/dL     Glucose Whole Blood - POCT [[id number]]  (Abnormal) Collected: 01/20/20 0721     Updated: 01/20/20 0808     Whole Blood Glucose POCT 262 mg/dL     COVID-19 (SARS-COV-2) ([facility] Rapid) [[id number]] Collected: 01/20/20 0013    Specimen: Nasopharyngeal Swab from Nasopharynx Updated: 01/20/20 0049     Purpose of COVID testing Screening     SARS-CoV-2 Specimen Source Nasopharyngeal     SARS CoV 2 Overall Result Negative    Narrative:      o Collect and clearly label specimen type:  o Upper respiratory specimen: One Nasopharyngeal Dry Swab NO  Transport Media.  o Hand deliver to laboratory ASAP  Indication for testing->Extended care facility admission to  semi private room  Screening    Troponin I [[id number]]  (Abnormal) Collected: 01/19/20 2202     Updated: 01/19/20 2232     Troponin I 0.06 ng/mL     Comprehensive metabolic panel [[id number]]  (Abnormal) Collected: 01/19/20 2202    Specimen: Blood Updated: 01/19/20 2226     Glucose 329 mg/dL      BUN 73.0 mg/dL      Creatinine 10.4 mg/dL      Sodium 135 mEq/L      Potassium 5.4 mEq/L      Chloride 104 mEq/L      CO2  17 mEq/L      Calcium 8.2 mg/dL      Protein, Total 7.2 g/dL      Albumin 2.8 g/dL      AST (SGOT) 6 U/L      ALT 11 U/L      Alkaline Phosphatase 122 U/L      Bilirubin, Total 0.5 mg/dL      Globulin 4.4 g/dL      Albumin/Globulin Ratio 0.6     Anion Gap 14.0    Hemolysis index [[id number]] Collected: 01/19/20 2202     Updated: 01/19/20 2226     Hemolysis Index 1    GFR [[id number]] Collected: 01/19/20 2202     Updated: 01/19/20 2226     EGFR 4.5    CBC and differential [[id number]]  (Abnormal) Collected: 01/19/20 2202    Specimen: Blood Updated: 01/19/20 2210     WBC 8.22 x10 3/uL      Hgb 9.3 g/dL      Hematocrit 31.6 %      Platelets 125 x10 3/uL      RBC 3.21 x10 6/uL      MCV 98.4 fL      MCH 29.0 pg      MCHC 29.4 g/dL      RDW 15 %      MPV 11.7 fL      Neutrophils 78.3 %      Lymphocytes  Automated 13.3 %      Monocytes 7.1 %      Eosinophils Automated 0.7 %      Basophils Automated 0.2 %      Immature Granulocytes 0.4 %      Nucleated RBC 0.0 /100 WBC      Neutrophils Absolute 6.44 x10 3/uL      Lymphocytes Absolute Automated 1.09 x10 3/uL      Monocytes Absolute Automated 0.58 x10 3/uL      Eosinophils Absolute Automated 0.06 x10 3/uL      Basophils Absolute Automated 0.02 x10 3/uL      Immature Granulocytes Absolute 0.03 x10 3/uL      Absolute NRBC 0.00 x10 3/uL           Imaging:  CXR  IMPRESSION:       Diffuse interstitial opacities are present, similar to prior. Previously  seen airspace opacities are no longer present.    XR R Wrist  IMPRESSION:   FINDINGS/ There is no evidence of fracture, dislocation, or  other acute osseous abnormality. 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  Arthritic changes noted in the first metacarpophalangeal joint. The  carpal rows are congruent. Vascular calcifications throughout the wrist  and forearm are present. Soft tissue edema is present in the medial and  dorsal wrist.    ECG 12 lead (Final result)  Component (Lab Inquiry)  Collection Time Result Time Ventr Rate Atrial Rat P-R Int. QRS Durat Q-T Int. QTC CALC P Axis RAxis T Axis   01/19/20 18:30:08 01/20/20 10:38:39 92 92 144 78 352 435 47 62 70         Final result                Narrative:    NORMAL SINUS RHYTHM   NORMAL ECG   WHEN COMPARED WITH ECG OF   31-Dec-2019 20:01,   PREMATURE SUPRAVENTRICULAR COMPLEXES   ARE NO LONGER   PRESENT   NONSPECIFIC T WAVE ABNORMALITY NO LONGER EVIDENT IN   INFERIOR LEADS   T WAVE AMPLITUDE  HAS INCREASED IN   ANTERIOR LEADS                01/19/2020 2258 hydrALAZINE (APRESOLINE) injection 10 mg 10 mg Intravenous Given    01/20/2020 0020 NIFEdipine XL (PROCARDIA XL) 24 hr tablet 60 mg 60 mg Oral Given    01/20/2020 0020 cloNIDine (CATAPRES) tablet 0.2 mg 0.2 mg Oral Given     Assessment and Plan:  Hypertensive urgency  ESRD on HD  Hyperkalemia  - Secondary to 2 missed  dialysis sessions.  - S/p clonidine and nifedipine with improvement.  - Monitor on telemetry.  - Planning for dialysis in AM. Appreciate nephrology assistance.    Elevated troponin  - Suspect secondary to demand ischemia from volume overload; denies chest pain.  - Trend troponins.  - Monitor on telemetry.    Right wrist pain  - XR non-acute.  - Splinted for suspected carpal tunnel syndrome.  - Continue to monitor.   - Tylenol PRN pain.    History of COVID-19 pneumonia  - Tested positive on 12/31/19. Did not meet criteria for remdesivir; given tapered course of steroids.     DM type II, on insulin  - A1c 10.2.  - Continue lantus 20 units QHS with SSI.    History of C.diff colitis  - Failed PO vancomycin and Flagyl.  - S/p Dificid and cholestyramine course.    Obesity, BMI 40.88      Nutrition  Heart healthy, carbohydrate consistent, and renal diet.    DVT/VTE Prophylaxis  SCD's, Heparin    Anticipated medical stability for discharge: 24-48 hours    Service status/Reason for ongoing hospitalization: urgent dialysis   Anticipated Discharge Needs: clarify outpatient HD plan.  Scheduled Meds:  Current Facility-Administered Medications   Medication Dose Route Frequency    amLODIPine  10 mg Oral Daily    atorvastatin  40 mg Oral Daily    heparin (porcine)  5,000 Units Subcutaneous Q8H [place]    insulin glargine  20 Units Subcutaneous QHS    insulin lispro  1-5 Units Subcutaneous TID AC    pantoprazole  40 mg Oral QAM AC     Continuous Infusions:  PRN Meds:.acetaminophen, albumin human, bisacodyl, Nursing communication: Adult Hypoglycemia Treatment Algorithm **AND** dextrose **AND** dextrose **AND** glucagon (rDNA), lidocaine, melatonin, naloxone, nystatin, ondansetron, ondansetron **OR** ondansetron, sodium chloride, sodium chloride      Intake/Output Summary (Last 24 hours) at 01/20/2020 1226  Last data filed at 01/20/2020 0400  Gross per 24 hour   Intake 150 ml   Output    Net 150 ml        UTILIZATION  REVIEW CONTACT: Mindi Junker, UR RN  Clinical case manager- Utilization review  NPI: [id number]  Tax ID: [id number]  Phone: [phone]  Fax: [phone]  Email: [email or web address]

## 2020-01-20 NOTE — Plan of Care (Signed)
Problem: Moderate/High Fall Risk Score >5  Goal: Patient will remain free of falls  Outcome: Progressing  Flowsheets (Taken 01/20/2020 0517)  High (Greater than 13):   MOD-Perform dangle, stand, walk (DSW) prior to mobilization   MOD-Re-orient confused patients   MOD-Remain with patient during toileting   LOW-Anticoagulation education for injury risk   HIGH-Bed alarm on at all times while patient in bed   HIGH-Apply yellow "Fall Risk" arm band   HIGH-Initiate use of floor mats as appropriate  VH High Risk (Greater than 13):   Use of floor mat   Use chair-pad alarm device   Use assistive devices     Problem: Safety  Goal: Patient will be free from injury during hospitalization  Outcome: Progressing  Flowsheets (Taken 01/20/2020 0517)  Patient will be free from injury during hospitalization:   Assess patient's risk for falls and implement fall prevention plan of care per policy   Provide and maintain safe environment   Use appropriate transfer methods   Ensure appropriate safety devices are available at the bedside   Hourly rounding  Goal: Patient will be free from infection during hospitalization  Outcome: Progressing     Problem: Pain  Goal: Pain at adequate level as identified by patient  Outcome: Progressing  Flowsheets (Taken 01/20/2020 0517)  Pain at adequate level as identified by patient:   Assess for risk of opioid induced respiratory depression, including snoring/sleep apnea. Alert healthcare team of risk factors identified.   Assess pain on admission, during daily assessment and/or before any "as needed" intervention(s)   Reassess pain within 30-60 minutes of any procedure/intervention, per Pain Assessment, Intervention, Reassessment (AIR) Cycle   Evaluate if patient comfort function goal is met   Evaluate patient's satisfaction with pain management progress   Offer non-pharmacological pain management interventions   Consult/collaborate with Pain Service   Include patient/patient care companion in  decisions related to pain management as needed     Problem: Side Effects from Pain Analgesia  Goal: Patient will experience minimal side effects of analgesic therapy  Outcome: Progressing  Flowsheets (Taken 01/20/2020 0517)  Patient will experience minimal side effects of analgesic therapy:   Monitor/assess patient's respiratory status (RR depth, effort, breath sounds)   Prevent/manage side effects per LIP orders (i.e. nausea, vomiting, pruritus, constipation, urinary retention, etc.)   Assess for changes in cognitive function   Evaluate for opioid-induced sedation with appropriate assessment tool (i.e. POSS)     Problem: Psychosocial and Spiritual Needs  Goal: Demonstrates ability to cope with hospitalization/illness  Outcome: Progressing

## 2020-01-20 NOTE — Progress Notes (Signed)
HD tx. Completed tolerated well; Net fluid off 4L; VSS throughout tx; Lt. AVF w/ good flow. Rt. Neck pain still unresolved. For regular HD tx.  Tomorrow. Report given to Buckner Malta RN   01/20/20 1615   Treatment Summary   Time Off Machine 1550   Duration of Treatment (Hours) 3.45   Treatment Type 1:1   Dialyzer Clearance Heavily streaked   Fluid Volume Off (mL) 4400   Prime Volume (mL) 200   Rinseback Volume (mL) 200   Fluid Given: Normal Saline (mL) 0   Fluid Given: PRBC  0 mL   Fluid Given: Albumin (mL) 0   Fluid Given: Other (mL) 0   Total Fluid Given 400   Hemodialysis Net Fluid Removed 4000   Post Treatment Assessment   Post-Treatment Weight (Kg) -4   Patient Response to Treatment tolerated well   Information for Next Treatment for Reg. HD tom   Additional Dialyzer Used 0   Graft/Fistula Left   No placement date or time found.   Access Type: Arteriovenous Fistula  Orientation: Left  Graft/Fistula Location: Upper Arm   Fistula/ Graft Assessment Abnormalities WDL   Needle Size 15 Gauge   Cannulation Sites held Arterial (min) 9   Cannulation Sites held Venous (min) 7   Hemostasis Achieved Yes   Vitals   Temp 97.9 F (36.6 C)   Resp Rate 18   BP 116/75   SpO2 95 %   O2 Device None (Room air)   Assessment   Mental Status Alert;Oriented;Cooperative   Cardiac (WDL) WDL   Cardiac Regularity Regular   Cardiac Symptoms None   Cardiac Rhythm Normal Sinus Rhythm   Respiratory  WDL   Respiratory Pattern Regular   Bilateral Breath Sounds Clear;Diminished   Cough Spontaneous   Edema  WDL   General Skin Color Appropriate for ethnicity   Skin Condition/Temp Warm;Dry   Gastrointestinal (WDL) WDL   Abdomen Inspection Soft;Nondistended   GI Symptoms None   Mobility Ambulatory with Assistance   Pain Assessment   Charting Type Assessment   Pain Scale Used Numeric Scale (0-10)   Numeric Pain Scale   Pain Score 3   POSS Score 1   Pain Location Neck   Pain Orientation Right   Pain Descriptors Aching   Pain Frequency Intermittent    Effect of Pain on Daily Activities mild   Patient's Stated Comfort Functional Goal 0   Pain Intervention(s) Heat applied   Multiple Pain Sites No   Education   Person taught Patient   Knowledge basis Minimal   Topics taught Fluid Management   Teaching Tools Explain   [name] Understanding   Bedside Nurse Communication   Name of bedside RN - post dialysis Buckner Malta

## 2020-01-20 NOTE — Plan of Care (Signed)
Problem: Renal Instability  Goal: Fluid and electrolyte balance are achieved/maintained  Flowsheets (Taken 01/20/2020 1400)  Fluid and electrolyte balance are achieved/maintained:   Monitor/assess lab values and report abnormal values   Monitor daily weight   Assess and reassess fluid and electrolyte status   Observe for cardiac arrhythmias   Monitor for muscle weakness   Monitor intake and output every shift   Assess for confusion/personality changes   Follow fluid restrictions/IV/PO parameters  Goal: Nutritional intake is adequate  Flowsheets (Taken 01/20/2020 1400)  Nutritional intake is adequate:   Monitor daily weights   Consult/collaborate with Clinical Nutritionist   Assess anorexia, appetite, and amount of meal/food tolerated   Assist patient with meals/food selection  Goal: Free from infection  Flowsheets (Taken 01/20/2020 1400)  Free from infection: Monitor/assess for signs and symptoms of infection     Problem: Patient Receiving Advanced Renal Therapies  Goal: Therapy access site remains intact  Flowsheets (Taken 01/20/2020 1400)  Therapy access site remains intact:   Assess therapy access site   Change therapy access site dressing as needed

## 2020-01-20 NOTE — Progress Notes (Addendum)
[facility]  PROGRESS NOTE      Patient: [name]  Date: 01/20/2020   LOS: 0 Days  Admission Date: 01/19/2020   MRN: [id number]  Attending: Lisa Roca, MD  Please contact me on the following Spectralink [phone]       ASSESSMENT/PLAN     [name] is a 64 y.o. female admitted with Hypertensive urgency    Interval Summary:     Patient Active Hospital Problem List:  Hypertensive urgency (01/19/2020)  Due to a combination of fluid overload and noncompliance with dialysis  Patient seen by nephrology much appreciated   blood pressure has improved due to medication adjustments as well as hemodialysis and ultrafiltration    End-stage renal disease  Continue Monday Wednesday Friday schedule  We will however dialyze again tomorrow seen dialysis for several days    Anemia  Due to CKD  Continue EPO per protocol.    Hyperkalemia  Most likely due to noncompliance with dialysis  Follow-up with serial chemistries    Metabolic acidosis  Due to CKD  Expect to improve with dialysis and ultrafiltration    Fluid overload  Continue dialysis and ultrafiltration  Follow-up and imaging studies      C. difficile by history  Treated with fidaxomicin  No further diarrhea.    Diabetes mellitus  Continue Lantus and sliding scale  Monitor blood sugar and adjust medications    COVID-19 infection  Currently asymptomatic    Morbid obesity  Diet exercise weight loss and lifestyle modification    Mild elevation of troponin  No chest pain   probably due to end-stage renal disease.    Peripheral neuropathy  Resume Neurontin      Plan discussed with patient          Analgesia: Tylenol and Neurontin    Nutrition: Mechanical soft diet    DVT Prophylaxis: SCDs        Code Status: Full code    DISPO: To be determined anticipate discharge to home medically stable    Family Contact: Patient's daughter       SUBJECTIVE     [name] states she feels better compared to previous day    MEDICATIONS     Current  Facility-Administered Medications   Medication Dose Route Frequency    amLODIPine  10 mg Oral Daily    atorvastatin  40 mg Oral Daily    gabapentin  600 mg Oral TID    heparin (porcine)  5,000 Units Subcutaneous Q8H [place]    insulin glargine  20 Units Subcutaneous QHS    insulin lispro  1-5 Units Subcutaneous TID AC    pantoprazole  40 mg Oral QAM AC    sevelamer  800 mg Oral TID MEALS       PHYSICAL EXAM     Vitals:    01/20/20 1545   BP:    Pulse:    Resp:    Temp:    SpO2: 100%       Temperature: Temp  Min: 97.5 F (36.4 C)  Max: 98.6 F (37 C)  Pulse: Pulse  Min: 79  Max: 110  Respiratory: Resp  Min: 16  Max: 22  Non-Invasive BP: BP  Min: 113/69  Max: 222/100  Pulse Oximetry SpO2  Min: 94 %  Max: 100 %    Intake and Output Summary (Last 24 hours) at Date Time    Intake/Output Summary (Last 24 hours) at 01/20/2020 1547  Last data  filed at 01/20/2020 0400  Gross per 24 hour   Intake 150 ml   Output    Net 150 ml         GEN APPEARANCE: Obese lady in no acute distress A&OX3  HEENT: PERLA; EOMI; Conjunctiva Clear  NECK: Supple; No bruits  CVS: RRR, S1, S2; No M/G/R  LUNGS: Bibasilar rales  ABD: Soft; No TTP; + Normoactive BS  EXT: No edema; Pulses 2+ and intact  Skin exam: Warm and dry  NEURO: CN 2-12 intact; No Focal neurological deficits  CAP REFILL: Less than 3 seconds capillary refill  MENTAL STATUS: Awake alert and oriented x3    Exam done by Lisa Roca, MD on 01/20/20 at 3:47 PM      LABS     Recent Labs   Lab 01/19/20  2202   WBC 8.22   RBC 3.21*   Hgb 9.3*   Hematocrit 31.6*   MCV 98.4*   Platelets 125*       Recent Labs   Lab 01/19/20  2202   Sodium 135*   Potassium 5.4*   Chloride 104   CO2 17*   BUN 73.0*   Creatinine 10.4*   Glucose 329*   Calcium 8.2*       Recent Labs   Lab 01/19/20  2202   ALT 11   AST (SGOT) 6   Bilirubin, Total 0.5   Albumin 2.8*   Alkaline Phosphatase 122*       Recent Labs   Lab 01/19/20  2202   Troponin I 0.06*             Microbiology Results (last 15 days)      Procedure Component Value Units Date/Time    COVID-19 (SARS-COV-2) [place] [facility]) [[id number]] Collected: 01/20/20 0013    Order Status: Completed Specimen: Nasopharyngeal Swab from Nasopharynx Updated: 01/20/20 0049     Purpose of COVID testing Screening     SARS-CoV-2 Specimen Source Nasopharyngeal     SARS CoV 2 Overall Result Negative     Comment: Test performed using the Abbott ID NOW EUA assay.  Please see Fact Sheets for patients and providers located at:  http://olson-hall.info/    This test is for the qualitative detection of SARS-CoV-2  (COVID19) nucleic acid. Viral nucleic acids may persist in vivo,  independent of viability. Detection of viral nucleic acid does  not imply the presence of infectious virus, or that virus  nucleic acid is the cause of clinical symptoms. Negative  results should be treated as presumptive and, if inconsistent  with clinical signs and symptoms or necessary for patient  management, should be tested with an alternative molecular  assay. Negative results do not preclude SARS-CoV-2 infection  and should not be used as the sole basis for patient  management decisions. Invalid results may be due to inhibiting  substances in the specimen and recollection should occur.         Narrative:      o Collect and clearly label specimen type:  o Upper respiratory specimen: One Nasopharyngeal Dry Swab NO  Transport Media.  o Hand deliver to laboratory ASAP  Indication for testing->Extended care facility admission to  semi private room  Screening           RADIOLOGY     Upon my review:    Signed,  Lisa Roca, MD  3:47 PM 01/20/2020

## 2020-01-20 NOTE — Progress Notes (Signed)
Pre HD report received from Prim. RN; Pt. A/ox4; c/o pain in her rt. Neck- w/ Heat pack applied to site. BP -WNL; pt was given AM dose of BP med. Lt. AVG has no cannulation problem. Will monitor closely while on HD.   01/20/20 1150   Bedside Nurse Communication   Name of bedside RN - pre dialysis [facility] [place]   Treatment Initiation- With Dialysis Precautions   Time Out/Safety Check Completed Yes   Preferred language No   Blood Consent Verified N/A   Dialysis Precautions All Connections Secured;Saline Line Double Clamped   Dialysis Treatment Type Routine;Bedside   Special Considerations Contact [place].   Is patient diabetic? Yes   RO/Hemodialysis Architectural technologist   Is Total Chlorine less than 0.1 ppm? Yes   Orignial Total Chlorine Testing Time 1130   At 4 Hour Total Chlorine Testing Time 1530   RO/Hemodialysis Facilities manager Number 2    Machine Serial Number [id number]   RO # [id number]   RO Serial # [id number]   Water Hardness NA   pH 7.2   Pressure Test Verified Yes   Alarms Verified Passed   Machine Temperature 96.8 F (36 C)   Alarms Verified Yes   Na+ mEq (Machine) 138 mEq   Bicarb mEq (Machine) 35 mEq   Hemodialysis Conductivity (Machine) 13.7   Hemodialysis Conductivity (Meter) 14   Dialyzer Lot Number [id number]   Tubing Lot Number [id number]   RO Machine Log Completed Yes   Hepatitis Status   HBsAg (Antigen) Result Negative   HBsAg Date Drawn 01/02/20   HBsAg Repeat Draw Due Date 01/30/19   Dialysis Weight   Pre-Treatment Weight (Kg) 103.9   Scale Type ICU Bed Scale   Vitals   Temp 97.9 F (36.6 C)   Resp Rate 16   BP 140/78   SpO2 97 %   O2 Device None (Room air)   Assessment   Mental Status Alert;Oriented;Cooperative   Cardiac (WDL) WDL   Cardiac Regularity Regular   Cardiac Symptoms None   Cardiac Rhythm Normal Sinus Rhythm   Respiratory  WDL   Respiratory Pattern Regular   Bilateral Breath Sounds Clear;Diminished   Cough Spontaneous   Edema  WDL   General Skin Color Appropriate for ethnicity    Skin Condition/Temp Warm;Dry   Gastrointestinal (WDL) WDL   Abdomen Inspection Soft;Nondistended   GI Symptoms None   Mobility Ambulatory with Assistance   Graft/Fistula Left   No placement date or time found.   Access Type: Arteriovenous Fistula  Orientation: Left  Graft/Fistula Location: Upper Arm   Fistula/ Graft Assessment Abnormalities WDL   Pain Assessment   Charting Type Assessment   Pain Scale Used Numeric Scale (0-10)   Numeric Pain Scale   Pain Score 5   POSS Score 1   Pain Location Neck   Pain Orientation Right   Pain Descriptors Aching   Pain Frequency Intermittent   Effect of Pain on Daily Activities mild   Patient's Stated Comfort Functional Goal 1   Pain Intervention(s) Heat applied   Multiple Pain Sites No   Hemodialysis Comments   Pre-Hemodialysis Comments Timeout procedure done   Hemodialysis History Information   Chronic Dialysis Patient Yes   Outpatient Nephrologist Dr. Nehemiah Massed

## 2020-01-20 NOTE — Plan of Care (Signed)
Pt vital signs stable. Pt had dialysis today-removed 4L. Will have dialysis again tomorrow. Pt wrist pain improving with heat and pain medication.  Resumed home meds gabapentin and sevelamer.  Problem: Renal Instability  Goal: Fluid and electrolyte balance are achieved/maintained  Outcome: Progressing  Flowsheets (Taken 01/20/2020 1820)  Fluid and electrolyte balance are achieved/maintained:   Monitor intake and output every shift   Monitor/assess lab values and report abnormal values   Provide adequate hydration   Monitor daily weight   Assess for confusion/personality changes   Assess and reassess fluid and electrolyte status   Observe for cardiac arrhythmias   Observe for seizure activity and initiate seizure precautions if indicated   Monitor for muscle weakness   Follow fluid restrictions/IV/PO parameters     Problem: Renal Instability  Goal: Nutritional intake is adequate  Outcome: Progressing  Flowsheets (Taken 01/20/2020 1400 by Mickel Baas, RN)  Nutritional intake is adequate:   Monitor daily weights   Consult/collaborate with Clinical Nutritionist   Assess anorexia, appetite, and amount of meal/food tolerated   Assist patient with meals/food selection

## 2020-01-20 NOTE — H&P (Signed)
[facility]      Patient: [name]  Date: 01/19/2020   DOB: 10-Aug-1955  Admission Date: 01/19/2020   MRN: [id number]  Attending: Dr. Genia Harold, Eveline Keto, PA-C         Chief Complaint   Patient presents with    Wrist Pain    Shortness of Breath      History Gathered From: chart review and patient    HISTORY AND PHYSICAL     [name] is a 64 y.o. female with a PMHx of ESRD on HD, COVID-19 pneumonia (dx on 12/31/19), COPD, DM, HTN, and carpal tunnel syndrome who presented to [facility] ED on 01/19/20 for complaint of right wrist pain x 2 days with associated numbness and tingling in her fingers. Denies recent trauma. She also endorses worsening shortness of breath for the past two weeks and has missed her last two dialysis sessions. She was recently admitted to [facility] from 12/31/19-01/08/20 for COVID pneumonia requiring supplemental O2 with course complicated by C.diff intractable to oral vancomycin and ultimately required Dificid. Outpatient HD was arranged during her COVID quarantine period until she could return to her regular dialysis center.     In ED, workup was notable for mildly elevated troponin at 0.06, hypertensive urgency, hyperglycemia and hyperkalemia. BP initially 204/96. Nephrology consulted who ordered clonidine and nifedipine with plans for dialysis in the morning. BP currently improved to 161/76. CXR revealed diffuse interstitial opacities without previously seen airspace opacities.  Right wrist XR was without acute fracture, dislocation, or osseous abnormality. Wright wrist placed in splint for suspected carpal tunnel syndrome. She was admitted for volume overload secondary to missed dialysis with plans for urgent dialysis.       Past Medical History:   Diagnosis Date    Chronic obstructive pulmonary disease     Diabetes mellitus     Hyperlipidemia     Hypertension     Sleep apnea 2018       Past Surgical History:   Procedure Laterality Date    EGD, BIOPSY N/A 05/14/2019     Procedure: EGD, BIOPSY;  Surgeon: Ronie Spies, MD;  Location: [facility];  Service: Gastroenterology;  Laterality: N/A;    EXPLORATORY LAPAROTOMY N/A 05/27/2019    Procedure: EXPLORATORY LAPAROTOMY;  Surgeon: Herbert Moors., MD;  Location: [facility];  Service: General;  Laterality: N/A;    JOINT REPLACEMENT      shoulder    JOINT REPLACEMENT      knee    OVARY SURGERY Left     Removal    REMOVAL, FOREIGN BODY N/A 05/27/2019    Procedure: REMOVAL, PERITONEAL DIALYSIS CATHETER;  Surgeon: Herbert Moors., MD;  Location: [facility];  Service: General;  Laterality: N/A;    TUNNELED CATH CHECK/CHANGE (PERMCATH) N/A 07/03/2019    Procedure: TUNNELED CATH CHECK/CHANGE;  Surgeon: Maureen Ralphs, MD;  Location: [facility];  Service: Interventional Radiology;  Laterality: N/A;    TUNNELED CATH PLACEMENT (PERMCATH) Bilateral 05/23/2019    Procedure: TUNNELED CATH PLACEMENT;  Surgeon: Jacques Earthly, MD;  Location: [facility];  Service: Interventional Radiology;  Laterality: Bilateral;    TUNNELED CATH PLACEMENT (PERMCATH) Right 07/31/2019    Procedure: TUNNELED CATH PLACEMENT;  Surgeon: Jacques Earthly, MD;  Location: [facility];  Service: Interventional Radiology;  Laterality: Right;       Prior to Admission medications    Medication Sig Start Date End Date Taking? Authorizing Provider   acetaminophen (TYLENOL) 325  MG tablet Take 2 tablets (650 mg total) by mouth every 6 (six) hours as needed for Pain or Fever 07/28/19   Lisa Roca, MD   amLODIPine (NORVASC) 10 MG tablet Take 1 tablet (10 mg total) by mouth daily 01/07/20   Winfred, Carolan Clines, MD   atorvastatin (LIPITOR) 40 MG tablet Take 40 mg by mouth daily    [provider]   bisacodyl (DULCOLAX) 5 MG EC tablet Take 1 tablet (5 mg total) by mouth daily as needed for Constipation 07/28/19   Lisa Roca, MD   cholestyramine Lucrezia Starch) 4 g packet Take 2 packets by mouth 2 (two) times daily 01/06/20   Winfred, Carolan Clines, MD    darbepoetin alfa (ARANESP) 60 MCG/0.3ML injection Inject 0.3 mLs (60 mcg total) into the skin once a week 07/30/19   Lisa Roca, MD   Dulaglutide (Trulicity) 1.77 LT/9.0ZE Solution Pen-injector Inject 0.75 mg into the skin once a week    [provider]   insulin glargine (LANTUS) 100 UNIT/ML injection Inject 20 Units into the skin nightly 01/06/20   Winfred, Carolan Clines, MD   lactobacillus/streptococcus (RISAQUAD) Cap Take 1 capsule by mouth daily 01/07/20   Winfred, Carolan Clines, MD   Lidocaine 2 % Gel Apply 1 application topically 4 (four) times daily To anal / buttock area 01/06/20   Winfred, Carolan Clines, MD   melatonin 3 mg tablet Take 1 tablet (3 mg total) by mouth nightly as needed for Sleep 07/28/19   Lisa Roca, MD   methylPREDNISolone (MEDROL DOSPACK) 4 MG tablet follow package directions 01/07/20   Winfred, Carolan Clines, MD   midodrine (PROAMATINE) 10 MG tablet Take 1 tablet (10 mg total) by mouth 3 (three) times daily as needed (SBP < 110 or DBP < 70) 01/06/20   Winfred, Carolan Clines, MD   nystatin (NYSTOP) powder Apply 1 application topically 2 (two) times daily 07/14/19   Amatya, Elodia Florence Man, MD   ondansetron (ZOFRAN-ODT) 4 MG disintegrating tablet Take 1 tablet (4 mg total) by mouth every 6 (six) hours as needed for Nausea 07/28/19   Lisa Roca, MD   pantoprazole (PROTONIX) 40 MG tablet Take 1 tablet (40 mg total) by mouth 2 (two) times daily 05/20/19   Ardeth Sportsman, MD   sevelamer (RENVELA) 800 MG tablet Take 800 mg by mouth 3 (three) times daily with meals    [provider]   vancomycin (VANCOCIN) 125 MG capsule Take 1 capsule (125 mg total) by mouth 4 (four) times daily for 7 days, THEN 1 capsule (125 mg total) 3 (three) times daily with meals for 7 days, THEN 1 capsule (125 mg total) 2 (two) times daily for 7 days, THEN 1 capsule (125 mg total) daily for 7 days. 01/14/20 02/11/20  Murlean Hark, MD   vitamin C (ASCORBIC ACID) 500 MG tablet Take 1 tablet (500 mg total) by  mouth daily 06/30/19   Tirrell, Ashley Mariner, MD   zinc Oxide (DESITIN) 40 % Paste paste Apply topically as needed for Dry Skin 01/06/20   Winfred, Carolan Clines, MD   zinc sulfate (ZINCATE) 220 (50 Zn) MG capsule Take 1 capsule (220 mg total) by mouth daily 06/30/19   Tirrell, Ashley Mariner, MD       Allergies   Allergen Reactions    Lisinopril Hives    Metformin Hives       CODE STATUS: FULL    PRIMARY CARE MD: Pcp, None, MD  Family History   Problem Relation Age of Onset    Diabetes Mother     Hypertension Mother     Diabetes Father     Hypertension Father     Diabetes Sister     Hypertension Sister     Diabetes Brother     Hypertension Brother     Diabetes Paternal Aunt     Diabetes Paternal Uncle        Social History     Tobacco Use    Smoking status: Never Smoker    Smokeless tobacco: Never Used   Brewing technologist Use: Never used   Substance Use Topics    Alcohol use: Never    Drug use: Never       REVIEW OF SYSTEMS   Positive for: intermittent chronic chest pain, SOB, cough with occasional phlegm, right wrist pain.  Negative for: fevers, chills, palpitations, abdominal pain, nausea, vomiting, diarrhea.  All ROS completed and otherwise negative.    PHYSICAL EXAM     Vital Signs (most recent): BP 161/76    Pulse 93    Temp 98.2 F (36.8 C) (Oral)    Resp 17    Ht 1.575 m (5\' 2" )    Wt 98.4 kg (217 lb)    LMP  (LMP Unknown) Comment: post menopausal   SpO2 98%    BMI 39.69 kg/m   Constitutional: well developed, obese female in NAD. Patient speaks freely in full sentences.   HEENT: NC/AT, PERRL, no scleral icterus, no nasal discharge, MMM, poor dentition  Neck: trachea midline, supple, no cervical or supraclavicular lymphadenopathy or masses  Cardiovascular: RRR, normal S1 S2, no murmurs, gallops, palpable thrills, non-displaced PMI.  Respiratory: Normal rate. No retractions or increased work of breathing. Clear to auscultation bilaterally.  Gastrointestinal: +BS, mildly distended, soft, non-tender, no  rebound or guarding  Genitourinary: no suprapubic tenderness  Musculoskeletal: moving all extremities. Right wrist in brace. No edema in extremities. DP and radial pulses 2+ and symmetric.  Skin exam:  Warm, dry. No rashes or wounds on exposed surfaces.   Neurologic: EOM grossly intact, CN 2-12 grossly intact. no gross motor or sensory deficits  Psychiatric: AAOx3, affect and mood appropriate. The patient is alert, interactive, appropriate.      LABS & IMAGING     Recent Results (from the past 24 hour(s))   CBC and differential    Collection Time: 01/19/20 10:02 PM   Result Value Ref Range    WBC 8.22 3.10 - 9.50 x10 3/uL    Hgb 9.3 (L) 11.4 - 14.8 g/dL    Hematocrit 31.6 (L) 34.7 - 43.7 %    Platelets 125 (L) 142 - 346 x10 3/uL    RBC 3.21 (L) 3.90 - 5.10 x10 6/uL    MCV 98.4 (H) 78.0 - 96.0 fL    MCH 29.0 25.1 - 33.5 pg    MCHC 29.4 (L) 31.5 - 35.8 g/dL    RDW 15 11 - 15 %    MPV 11.7 8.9 - 12.5 fL    Neutrophils 78.3 None %    Lymphocytes Automated 13.3 None %    Monocytes 7.1 None %    Eosinophils Automated 0.7 None %    Basophils Automated 0.2 None %    Immature Granulocytes 0.4 None %    Nucleated RBC 0.0 0.0 - 0.0 /100 WBC    Neutrophils Absolute 6.44 (H) 1.10 - 6.33 x10 3/uL    Lymphocytes Absolute Automated  1.09 0.42 - 3.22 x10 3/uL    Monocytes Absolute Automated 0.58 0.21 - 0.85 x10 3/uL    Eosinophils Absolute Automated 0.06 0.00 - 0.44 x10 3/uL    Basophils Absolute Automated 0.02 0.00 - 0.08 x10 3/uL    Immature Granulocytes Absolute 0.03 0.00 - 0.07 x10 3/uL    Absolute NRBC 0.00 0.00 - 0.00 x10 3/uL   Comprehensive metabolic panel    Collection Time: 01/19/20 10:02 PM   Result Value Ref Range    Glucose 329 (H) 70 - 100 mg/dL    BUN 73.0 (H) 7.0 - 19.0 mg/dL    Creatinine 10.4 (H) 0.6 - 1.0 mg/dL    Sodium 135 (L) 136 - 145 mEq/L    Potassium 5.4 (H) 3.5 - 5.1 mEq/L    Chloride 104 100 - 111 mEq/L    CO2 17 (L) 22 - 29 mEq/L    Calcium 8.2 (L) 8.5 - 10.5 mg/dL    Protein, Total 7.2 6.0 - 8.3 g/dL     Albumin 2.8 (L) 3.5 - 5.0 g/dL    AST (SGOT) 6 5 - 34 U/L    ALT 11 0 - 55 U/L    Alkaline Phosphatase 122 (H) 37 - 106 U/L    Bilirubin, Total 0.5 0.2 - 1.2 mg/dL    Globulin 4.4 (H) 2.0 - 3.6 g/dL    Albumin/Globulin Ratio 0.6 (L) 0.9 - 2.2    Anion Gap 14.0 5.0 - 15.0   Hemolysis index    Collection Time: 01/19/20 10:02 PM   Result Value Ref Range    Hemolysis Index 1 0 - 18   GFR    Collection Time: 01/19/20 10:02 PM   Result Value Ref Range    EGFR 4.5    Troponin I    Collection Time: 01/19/20 10:02 PM   Result Value Ref Range    Troponin I 0.06 (H) 0.00 - 0.05 ng/mL   COVID-19 (SARS-COV-2) ([place] [facility])    Collection Time: 01/20/20 12:13 AM    Specimen: Nasopharynx; Nasopharyngeal Swab   Result Value Ref Range    Purpose of COVID testing Screening     SARS-CoV-2 Specimen Source Nasopharyngeal     SARS CoV 2 Overall Result Negative        MICROBIOLOGY:  Blood Culture: none  Urine Culture: none  Antibiotics Started: none    IMAGING:  XR Chest 2 Views    Result Date: 01/19/2020  Diffuse interstitial opacities are present, similar to prior. Previously seen airspace opacities are no longer present. Earney Navy, MD  01/19/2020 7:02 PM    Wrist Right PA Lateral And Oblique    Result Date: 01/19/2020  FINDINGS/ There is no evidence of fracture, dislocation, or other acute osseous abnormality. 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 Arthritic changes noted in the first metacarpophalangeal joint. The carpal rows are congruent. Vascular calcifications throughout the wrist and forearm are present. Soft tissue edema is present in the medial and dorsal wrist. Earney Navy, MD  01/19/2020 7:05 PM      CARDIAC:  EKG Interpretation (upon my review):  NSR, HR 92 bpm, no peaked T waves, no STEMI    Markers:  Recent Labs   Lab 01/19/20  2202   Troponin I 0.06*       EMERGENCY DEPARTMENT COURSE:  Orders Placed This Encounter   Procedures    Splint-Upper Extremity    COVID-19 (SARS-COV-2) ([place] [facility])    XR Chest 2 Views    Wrist  Right PA Lateral And Oblique    CBC and differential    Comprehensive metabolic panel    Hemolysis index    GFR    Troponin I    Diet regular    Catheter care (fistula and graft)    ED Holding Orders Expire in 12 Hours    Notify Admitting Attending ( Change in Condition)    Notify Attending of Patient Arrival to Floor within 12 Hours    Notify Physician (Vital Signs)    Notify Physician (Lab Results)    Vital Signs Q4HR    Telemetry Monitoring Continuous    ED Unit Sec Comm Order    ED Unit Sec Comm Order    ED Unit Sec Comm Order    ECG 12 lead    Admit to Observation       ASSESSMENT & PLAN     [name] is a 64 y.o. female admitted under observation with Hypertensive urgency.    Patient Active Hospital Problem List:       Hypertensive urgency  ESRD on HD  Hyperkalemia  - Secondary to 2 missed dialysis sessions.  - S/p clonidine and nifedipine with improvement.  - Monitor on telemetry.  - Planning for dialysis in AM. Appreciate nephrology assistance.    Elevated troponin  - Suspect secondary to demand ischemia from volume overload; denies chest pain.  - Trend troponins.  - Monitor on telemetry.    Right wrist pain  - XR non-acute.  - Splinted for suspected carpal tunnel syndrome.  - Continue to monitor.   - Tylenol PRN pain.    History of COVID-19 pneumonia  - Tested positive on 12/31/19. Did not meet criteria for remdesivir; given tapered course of steroids.     DM type II, on insulin  - A1c 10.2.  - Continue lantus 20 units QHS with SSI.    History of C.diff colitis  - Failed PO vancomycin and Flagyl.  - S/p Dificid and cholestyramine course.    Obesity, BMI 40.88      Nutrition  Heart healthy, carbohydrate consistent, and renal diet.    DVT/VTE Prophylaxis  SCD's, Heparin    Anticipated medical stability for discharge: 24-48 hours    Service status/Reason for ongoing hospitalization: urgent dialysis   Anticipated Discharge Needs: clarify outpatient HD plan.    Signed,  Eveline Keto, PA-C    01/20/2020 2:08 AM

## 2020-01-20 NOTE — Consults (Signed)
[facility]  CONSULT  Aaron Edelman, x [phone] ([place])      Date Time: 01/20/20 2:27 PM  Patient Name: [name]  Requesting Physician: Lisa Roca, MD  Consulting Physician: Verl Blalock, MD, MD    Primary Care Physician: Merryl Hacker, None, MD    Reason for Consultation: ESRD, provision of hemodialysis       Assessment:     Patient Active Problem List   Diagnosis    Iron deficiency anemia secondary to inadequate dietary iron intake    Sideropenic dysphagia    Peritoneal dialysis catheter dysfunction, initial encounter    Peritonitis    Upper GI bleed    ESRD (end stage renal disease) on dialysis    Generalized abdominal pain    Thrombocytopenia    h/o orthostatic hypotension    Type 2 diabetes mellitus, with long-term current use of insulin    GERD (gastroesophageal reflux disease)    Bowel incontinence    Right sided numbness    Uncontrolled type 2 diabetes mellitus with hyperglycemia    Hypertension    Hyperlipidemia    History of COPD    Gastritis with hemorrhage    History of anemia due to chronic kidney disease    Open wound, abdominal wall, lateral, subsequent encounter    Gout    Dizziness    Fall    History of CVA (cerebrovascular accident)    Shortness of breath    Pneumonia due to COVID-19 virus    Diarrhea    Hypertensive urgency     1. ESRD on HD MWF at [facility]   - was discharged to a covid HD unit while she was in quarantine   - reported last HD on Wednesday   2. Mild hyperkalemia   3. Metabolic acidosis due to ESRD  4. Anemia of CKD  5. Covid 19+  6. Recent C.diff infection requiring fidaxomicin  7. HTN, better with HD    Recommendations:     1. Presently being dialyzed, aiming for 4L net ultrafiltration  2. Plan for HD again tomorrow   3. EPO   4. Monitor BP       Chinery, Lawerance Sabal, MD, thank you for this consultation.  We will follow the patient with you during this hospitalization.  Please contact me with any questions or  issues.    Signed by: Verl Blalock, MD, MD  [facility]  [phone] (office)  x [phone] ([place])      -----------------------------------------------------------------------------------------------------------        History of Presenting Illness:   [name] is a 64 y.o. female ESRD on HD MWF at [facility], DM type 2, HTN, HLD who presented to the hospital with right wrist pain and associated numbness and tingling in her fingers. Nephrology consulted for provision of hemodialysis.     Patient was recently admitted to [facility] with Covid 19+ pneumonia, with hospital course complicated by C. Diff requiring fidaxomicin. Now, she comes complaining of right wrist pain. Imaging negative for fracture, dislocation, or other acute process; vascular calcifications.     Past Medical History:     Past Medical History:   Diagnosis Date    Chronic obstructive pulmonary disease     Diabetes mellitus     Hyperlipidemia     Hypertension     Sleep apnea 2018       Past Surgical History:     Past Surgical History:   Procedure Laterality Date  EGD, BIOPSY N/A 05/14/2019    Procedure: EGD, BIOPSY;  Surgeon: Ronie Spies, MD;  Location: [facility];  Service: Gastroenterology;  Laterality: N/A;    EXPLORATORY LAPAROTOMY N/A 05/27/2019    Procedure: EXPLORATORY LAPAROTOMY;  Surgeon: Herbert Moors., MD;  Location: [facility];  Service: General;  Laterality: N/A;    JOINT REPLACEMENT      shoulder    JOINT REPLACEMENT      knee    OVARY SURGERY Left     Removal    REMOVAL, FOREIGN BODY N/A 05/27/2019    Procedure: REMOVAL, PERITONEAL DIALYSIS CATHETER;  Surgeon: Herbert Moors., MD;  Location: [facility];  Service: General;  Laterality: N/A;    TUNNELED CATH CHECK/CHANGE (PERMCATH) N/A 07/03/2019    Procedure: TUNNELED CATH CHECK/CHANGE;  Surgeon: Maureen Ralphs, MD;  Location: [facility];  Service: Interventional Radiology;  Laterality: N/A;    TUNNELED CATH  PLACEMENT (PERMCATH) Bilateral 05/23/2019    Procedure: TUNNELED CATH PLACEMENT;  Surgeon: Jacques Earthly, MD;  Location: [facility];  Service: Interventional Radiology;  Laterality: Bilateral;    TUNNELED CATH PLACEMENT (PERMCATH) Right 07/31/2019    Procedure: TUNNELED CATH PLACEMENT;  Surgeon: Jacques Earthly, MD;  Location: [facility];  Service: Interventional Radiology;  Laterality: Right;       Family History:     Family History   Problem Relation Age of Onset    Diabetes Mother     Hypertension Mother     Diabetes Father     Hypertension Father     Diabetes Sister     Hypertension Sister     Diabetes Brother     Hypertension Brother     Diabetes Paternal Aunt     Diabetes Paternal Uncle        Social History:     Social History     Socioeconomic History    Marital status: Legally Separated     Spouse name: Not on file    Number of children: Not on file    Years of education: Not on file    Highest education level: Not on file   Occupational History    Not on file   Tobacco Use    Smoking status: Never Smoker    Smokeless tobacco: Never Used   Brewing technologist Use: Never used   Substance and Sexual Activity    Alcohol use: Never    Drug use: Never    Sexual activity: Not Currently   Other Topics Concern    Not on file   Social History Narrative    Not on file     Social Determinants of Health     Financial Resource Strain:     Difficulty of Paying Living Expenses: Not on file   Food Insecurity:     Worried About Charity fundraiser in the Last Year: Not on file    [employer] of Food in the Last Year: Not on file   Transportation Needs:     Lack of Transportation (Medical): Not on file    Lack of Transportation (Non-Medical): Not on file   Physical Activity:     Days of Exercise per Week: Not on file    Minutes of Exercise per Session: Not on file   Stress:     Feeling of Stress : Not on file   Social Connections:     Frequency of Communication with Friends and Family:  Not  on file    Frequency of Social Gatherings with Friends and Family: Not on file    Attends Religious Services: Not on file    Active Member of Clubs or Organizations: Not on file    Attends Archivist Meetings: Not on file    Marital Status: Not on file   Intimate Partner Violence:     Fear of Current or Ex-Partner: Not on file    Emotionally Abused: Not on file    Physically Abused: Not on file    Sexually Abused: Not on file   Housing Stability:     Unable to Pay for Housing in the Last Year: Not on file    Number of Places Lived in the Last Year: Not on file    Unstable Housing in the Last Year: Not on file       Allergies:     Allergies   Allergen Reactions    Lisinopril Hives    Metformin Hives       Medications:     Medications Prior to Admission   Medication Sig    acetaminophen (TYLENOL) 325 MG tablet Take 2 tablets (650 mg total) by mouth every 6 (six) hours as needed for Pain or Fever    amLODIPine (NORVASC) 10 MG tablet Take 1 tablet (10 mg total) by mouth daily    atorvastatin (LIPITOR) 40 MG tablet Take 40 mg by mouth daily    darbepoetin alfa (ARANESP) 60 MCG/0.3ML injection Inject 0.3 mLs (60 mcg total) into the skin once a week    Dulaglutide (Trulicity) 1.61 WR/6.0AV Solution Pen-injector Inject 0.75 mg into the skin once a week    gabapentin (NEURONTIN) 600 MG tablet Take 600 mg by mouth 3 (three) times daily    insulin glargine (LANTUS) 100 UNIT/ML injection Inject 20 Units into the skin nightly    Lidocaine 2 % Gel Apply 1 application topically 4 (four) times daily To anal / buttock area    nystatin (NYSTOP) powder Apply 1 application topically 2 (two) times daily    bisacodyl (DULCOLAX) 5 MG EC tablet Take 1 tablet (5 mg total) by mouth daily as needed for Constipation    cholestyramine (QUESTRAN) 4 g packet Take 2 packets by mouth 2 (two) times daily    lactobacillus/streptococcus (RISAQUAD) Cap Take 1 capsule by mouth daily    melatonin 3 mg tablet Take  1 tablet (3 mg total) by mouth nightly as needed for Sleep    methylPREDNISolone (MEDROL DOSPACK) 4 MG tablet follow package directions    midodrine (PROAMATINE) 10 MG tablet Take 1 tablet (10 mg total) by mouth 3 (three) times daily as needed (SBP < 110 or DBP < 70)    ondansetron (ZOFRAN-ODT) 4 MG disintegrating tablet Take 1 tablet (4 mg total) by mouth every 6 (six) hours as needed for Nausea    pantoprazole (PROTONIX) 40 MG tablet Take 1 tablet (40 mg total) by mouth 2 (two) times daily    sevelamer (RENVELA) 800 MG tablet Take 800 mg by mouth 3 (three) times daily with meals    vancomycin (VANCOCIN) 125 MG capsule Take 1 capsule (125 mg total) by mouth 4 (four) times daily for 7 days, THEN 1 capsule (125 mg total) 3 (three) times daily with meals for 7 days, THEN 1 capsule (125 mg total) 2 (two) times daily for 7 days, THEN 1 capsule (125 mg total) daily for 7 days.    vitamin C (ASCORBIC ACID) 500 MG tablet Take  1 tablet (500 mg total) by mouth daily    zinc Oxide (DESITIN) 40 % Paste paste Apply topically as needed for Dry Skin    zinc sulfate (ZINCATE) 220 (50 Zn) MG capsule Take 1 capsule (220 mg total) by mouth daily        Scheduled Meds: PRN Meds:    amLODIPine, 10 mg, Oral, Daily  atorvastatin, 40 mg, Oral, Daily  heparin (porcine), 5,000 Units, Subcutaneous, Q8H [place]  insulin glargine, 20 Units, Subcutaneous, QHS  insulin lispro, 1-5 Units, Subcutaneous, TID AC  pantoprazole, 40 mg, Oral, QAM AC          Continuous Infusions:   acetaminophen, 650 mg, Q6H PRN  albumin human, 100 mL, PRN  bisacodyl, 5 mg, Daily PRN  dextrose, 15 g of glucose, PRN   And  dextrose, 12.5 g, PRN   And  glucagon (rDNA), 1 mg, PRN  lidocaine, , PRN  melatonin, 3 mg, QHS PRN  naloxone, 0.2 mg, PRN  nystatin, 1 application, BID PRN  ondansetron, 4 mg, Once PRN  ondansetron, 4 mg, Q6H PRN   Or  ondansetron, 4 mg, Q6H PRN  sodium chloride, 100 mL, Q1H PRN  sodium chloride, 250 mL, PRN            Review of Systems:    Deferred today given isolation for Covid 19    Physical Exam:     Vitals:    01/20/20 1315 01/20/20 1330 01/20/20 1345 01/20/20 1400   BP: 158/82 137/76 150/76 129/77   Pulse: 88 83 82 84   Resp: 16 18 16 17    Temp:       TempSrc:       SpO2: 99% 100% 98% 98%   Weight:       Height:           Intake and Output Summary (Last 24 hours) at Date Time    Intake/Output Summary (Last 24 hours) at 01/20/2020 1427  Last data filed at 01/20/2020 0400  Gross per 24 hour   Intake 150 ml   Output    Net 150 ml       Recent weights:  Weight Monitoring 01/04/2020 01/05/2020 01/06/2020 01/07/2020 01/08/2020 01/19/2020 01/20/2020   Height - - - - - 157.5 cm 157.5 cm   Height Method - - - - - - -   Weight 98.294 kg 48.9 kg 98.3 kg 98.431 kg (No Data) 98.431 kg 101.4 kg   Weight Method Bed Scale - Bed Scale Bed Scale - - -   BMI (calculated) - - - - - 39.8 kg/m2 41 kg/m2       Deferred given isolation for Covid 19 PNA      Labs:     Recent Labs   Lab 01/19/20  2202   WBC 8.22   Hgb 9.3*   Hematocrit 31.6*   Platelets 125*     Recent Labs   Lab 01/19/20  2202   Sodium 135*   Potassium 5.4*   Chloride 104   CO2 17*   BUN 73.0*   Creatinine 10.4*   Calcium 8.2*   Albumin 2.8*   Glucose 329*   EGFR 4.5           cc: Lisa Roca, MD  Pcp, None, MD

## 2020-01-21 LAB — HEMOLYSIS INDEX
Hemolysis Index: 0 (ref 0–18)
Hemolysis Index: -1 — ABNORMAL LOW (ref 0–18)

## 2020-01-21 LAB — COMPREHENSIVE METABOLIC PANEL
ALT: 6 U/L (ref 0–55)
AST (SGOT): 5 U/L (ref 5–34)
Albumin/Globulin Ratio: 0.6 — ABNORMAL LOW (ref 0.9–2.2)
Albumin: 2.2 g/dL — ABNORMAL LOW (ref 3.5–5.0)
Alkaline Phosphatase: 89 U/L (ref 37–106)
Anion Gap: 11 (ref 5.0–15.0)
BUN: 33 mg/dL — ABNORMAL HIGH (ref 7.0–19.0)
Bilirubin, Total: 0.5 mg/dL (ref 0.2–1.2)
CO2: 26 mEq/L (ref 22–29)
Calcium: 8 mg/dL — ABNORMAL LOW (ref 8.5–10.5)
Chloride: 98 mEq/L — ABNORMAL LOW (ref 100–111)
Creatinine: 6.4 mg/dL — ABNORMAL HIGH (ref 0.6–1.0)
Globulin: 3.6 g/dL (ref 2.0–3.6)
Glucose: 258 mg/dL — ABNORMAL HIGH (ref 70–100)
Potassium: 4.2 mEq/L (ref 3.5–5.1)
Protein, Total: 5.8 g/dL — ABNORMAL LOW (ref 6.0–8.3)
Sodium: 135 mEq/L — ABNORMAL LOW (ref 136–145)

## 2020-01-21 LAB — CBC
Absolute NRBC: 0 10*3/uL (ref 0.00–0.00)
Hematocrit: 25.2 % — ABNORMAL LOW (ref 34.7–43.7)
Hgb: 7.6 g/dL — ABNORMAL LOW (ref 11.4–14.8)
MCH: 29.8 pg (ref 25.1–33.5)
MCHC: 30.2 g/dL — ABNORMAL LOW (ref 31.5–35.8)
MCV: 98.8 fL — ABNORMAL HIGH (ref 78.0–96.0)
MPV: 12.4 fL (ref 8.9–12.5)
Nucleated RBC: 0 /100 WBC (ref 0.0–0.0)
Platelets: 115 10*3/uL — ABNORMAL LOW (ref 142–346)
RBC: 2.55 10*6/uL — ABNORMAL LOW (ref 3.90–5.10)
RDW: 15 % (ref 11–15)
WBC: 4.25 10*3/uL (ref 3.10–9.50)

## 2020-01-21 LAB — GLUCOSE WHOLE BLOOD - POCT
Whole Blood Glucose POCT: 197 mg/dL — ABNORMAL HIGH (ref 70–100)
Whole Blood Glucose POCT: 294 mg/dL — ABNORMAL HIGH (ref 70–100)
Whole Blood Glucose POCT: 289 mg/dL — ABNORMAL HIGH (ref 70–100)
Whole Blood Glucose POCT: 303 mg/dL — ABNORMAL HIGH (ref 70–100)

## 2020-01-21 LAB — TROPONIN I: Troponin I: 0.02 ng/mL (ref 0.00–0.05)

## 2020-01-21 LAB — GFR: EGFR: 7.9

## 2020-01-21 LAB — PHOSPHORUS: Phosphorus: 4.6 mg/dL (ref 2.3–4.7)

## 2020-01-21 LAB — MAGNESIUM: Magnesium: 1.8 mg/dL (ref 1.6–2.6)

## 2020-01-21 MED ORDER — INSULIN LISPRO 100 UNIT/ML SC SOLN
1.0000 [IU] | Freq: Every evening | SUBCUTANEOUS | Status: DC
Start: 2020-01-21 — End: 2020-01-22
  Filled 2020-01-21: qty 12

## 2020-01-21 NOTE — Plan of Care (Signed)
Problem: Renal Instability  Goal: Fluid and electrolyte balance are achieved/maintained  Flowsheets (Taken 01/21/2020 1120)  Fluid and electrolyte balance are achieved/maintained:   Monitor intake and output every shift   Monitor/assess lab values and report abnormal values   Provide adequate hydration   Monitor daily weight   Assess and reassess fluid and electrolyte status   Assess for confusion/personality changes   Observe for cardiac arrhythmias   Follow fluid restrictions/IV/PO parameters  Goal: Nutritional intake is adequate  Flowsheets (Taken 01/21/2020 1120)  Nutritional intake is adequate:   Monitor daily weights   Assist patient with meals/food selection   Consult/collaborate with Clinical Nutritionist   Include patient/patient care companion in decisions related to nutrition  Goal: Free from infection  Flowsheets (Taken 01/21/2020 1120)  Free from infection: Monitor/assess for signs and symptoms of infection     Problem: Patient Receiving Advanced Renal Therapies  Goal: Therapy access site remains intact  Flowsheets (Taken 01/21/2020 1120)  Therapy access site remains intact:   Assess therapy access site   Change therapy access site dressing as needed

## 2020-01-21 NOTE — Progress Notes (Signed)
[facility]  PROGRESS NOTE      Patient: [name]  Date: 01/21/2020   LOS: 0 Days  Admission Date: 01/19/2020   MRN: [id number]  Attending: Lisa Roca, MD  Please contact me on the following Spectralink [phone]       ASSESSMENT/PLAN     [name] is a 64 y.o. female admitted with Hypertensive urgency    Interval Summary:     Patient Active Hospital Problem List:  Hypertensive urgency (01/19/2020)  Due to a combination of fluid overload and noncompliance with dialysis  Patient seen by nephrology much appreciated   blood pressure has improved due to medication adjustments as well as hemodialysis and ultrafiltration    End-stage renal disease  Continue Monday Wednesday Friday schedule  Patient underwent dialysis again today    Patient claims she feels weak and fatigued after dialysis  We'll monitor overnight and if stable and hemoglobin is stable we'll plan to discharge home tomorrow    Anemia  Due to CKD  Continue EPO per protocol.    Hyperkalemia  Most likely due to noncompliance with dialysis  Follow-up with serial chemistries    Metabolic acidosis  Due to CKD  Expect to improve with dialysis and ultrafiltration    Fluid overload  Continue dialysis and ultrafiltration  Follow-up and imaging studies      C. difficile by history  Treated with fidaxomicin  No further diarrhea.    Diabetes mellitus  Continue Lantus and sliding scale  Monitor blood sugar and adjust medications    COVID-19 infection  Currently asymptomatic    Morbid obesity  Diet exercise weight loss and lifestyle modification    Mild elevation of troponin  No chest pain   probably due to end-stage renal disease.    Peripheral neuropathy  Continue Neurontin          Plan discussed with patient and bedside RN          Analgesia: Tylenol and Neurontin    Nutrition: Mechanical soft diet    DVT Prophylaxis: SCDs        Code Status: Full code    DISPO: To be determined anticipate discharge to home medically stable    Family Contact:  Patient's daughter       SUBJECTIVE     [name] states she feels better compared to previous day but has intermittent dyspnea and feels fatigued    MEDICATIONS     Current Facility-Administered Medications   Medication Dose Route Frequency    amLODIPine  10 mg Oral Daily    atorvastatin  40 mg Oral Daily    gabapentin  600 mg Oral TID    heparin (porcine)  5,000 Units Subcutaneous Q8H [place]    insulin glargine  20 Units Subcutaneous QHS    insulin lispro  1-5 Units Subcutaneous TID AC    pantoprazole  40 mg Oral QAM AC    sevelamer  800 mg Oral TID MEALS       PHYSICAL EXAM     Vitals:    01/21/20 1611   BP: 101/64   Pulse: 91   Resp: 16   Temp: 97.5 F (36.4 C)   SpO2: 92%       Temperature: Temp  Min: 97.5 F (36.4 C)  Max: 100.4 F (38 C)  Pulse: Pulse  Min: 79  Max: 94  Respiratory: Resp  Min: 14  Max: 20  Non-Invasive BP: BP  Min: 93/57  Max:  121/70  Pulse Oximetry SpO2  Min: 91 %  Max: 96 %    Intake and Output Summary (Last 24 hours) at Date Time    Intake/Output Summary (Last 24 hours) at 01/21/2020 1812  Last data filed at 01/21/2020 1611  Gross per 24 hour   Intake 240 ml   Output 2900 ml   Net -2660 ml         GEN APPEARANCE: Obese lady in no acute distress A&OX3  HEENT: PERLA; EOMI; Conjunctiva Clear  NECK: Supple; No bruits  CVS: RRR, S1, S2; No M/G/R  LUNGS: Bibasilar rales  ABD: Soft; No TTP; + Normoactive BS  EXT: No edema; Pulses 2+ and intact  Skin exam: Warm and dry  NEURO: CN 2-12 intact; No Focal neurological deficits  CAP REFILL: Less than 3 seconds capillary refill  MENTAL STATUS: Awake alert and oriented x3    Exam done by Lisa Roca, MD on 01/20/20 at 3:47 PM      LABS     Recent Labs   Lab 01/21/20  0319 01/19/20  2202   WBC 4.25 8.22   RBC 2.55* 3.21*   Hgb 7.6* 9.3*   Hematocrit 25.2* 31.6*   MCV 98.8* 98.4*   Platelets 115* 125*       Recent Labs   Lab 01/21/20  0319 01/19/20  2202   Sodium 135* 135*   Potassium 4.2 5.4*   Chloride 98* 104   CO2 26 17*   BUN  33.0* 73.0*   Creatinine 6.4* 10.4*   Glucose 258* 329*   Calcium 8.0* 8.2*   Magnesium 1.8  --        Recent Labs   Lab 01/21/20  0319 01/19/20  2202   ALT 6 11   AST (SGOT) 5 6   Bilirubin, Total 0.5 0.5   Albumin 2.2* 2.8*   Alkaline Phosphatase 89 122*       Recent Labs   Lab 01/21/20  0319 01/19/20  2202   Troponin I 0.02 0.06*             Microbiology Results (last 15 days)     Procedure Component Value Units Date/Time    COVID-19 (SARS-COV-2) [employer] Rapid) [[id number]] Collected: 01/20/20 0013    Order Status: Completed Specimen: Nasopharyngeal Swab from Nasopharynx Updated: 01/20/20 0049     Purpose of COVID testing Screening     SARS-CoV-2 Specimen Source Nasopharyngeal     SARS CoV 2 Overall Result Negative     Comment: Test performed using the Abbott ID NOW EUA assay.  Please see Fact Sheets for patients and providers located at:  http://olson-hall.info/    This test is for the qualitative detection of SARS-CoV-2  (COVID19) nucleic acid. Viral nucleic acids may persist in vivo,  independent of viability. Detection of viral nucleic acid does  not imply the presence of infectious virus, or that virus  nucleic acid is the cause of clinical symptoms. Negative  results should be treated as presumptive and, if inconsistent  with clinical signs and symptoms or necessary for patient  management, should be tested with an alternative molecular  assay. Negative results do not preclude SARS-CoV-2 infection  and should not be used as the sole basis for patient  management decisions. Invalid results may be due to inhibiting  substances in the specimen and recollection should occur.         Narrative:      o Collect and clearly label specimen type:  o Upper respiratory specimen: One Nasopharyngeal Dry Swab NO  Transport Media.  o Hand deliver to laboratory ASAP  Indication for testing->Extended care facility admission to  semi private room  Screening           RADIOLOGY     Upon my  review:    Signed,  Lisa Roca, MD  6:12 PM 01/21/2020

## 2020-01-21 NOTE — UM Notes (Signed)
01/19/20 2358  Admit to ObservationOnce    Diagnosis: Hypertensive Urgency    Level of Care: Intermediate Care    Patient Class: Observation            Concurrent review: January 21, 2020     Current VS:   Temp:  [97.7 F (36.5 C)-100.4 F (38 C)] 97.7 F (36.5 C)  Heart Rate:  [79-94] 91  Resp Rate:  [14-20] 16  BP: (93-127)/(48-78) 114/66    Labs:  Lab Results last 48 Hours     Procedure Component Value Units Date/Time    Glucose Whole Blood - POCT [[id number]]  (Abnormal) Collected: 01/21/20 1217     Updated: 01/21/20 1223     Whole Blood Glucose POCT 294 mg/dL     Magnesium [[id number]] Collected: 01/21/20 0319    Specimen: Blood Updated: 01/21/20 0920     Magnesium 1.8 mg/dL     Hemolysis index [[id number]]  (Abnormal) Collected: 01/21/20 0319     Updated: 01/21/20 0920     Hemolysis Index -1    Phosphorus [[id number]] Collected: 01/21/20 0319    Specimen: Blood Updated: 01/21/20 0920     Phosphorus 4.6 mg/dL     Glucose Whole Blood - POCT [[id number]]  (Abnormal) Collected: 01/21/20 0850     Updated: 01/21/20 0853     Whole Blood Glucose POCT 197 mg/dL     Hemolysis index [[id number]] Collected: 01/21/20 0319     Updated: 01/21/20 0524     Hemolysis Index 0    GFR [[id number]] Collected: 01/21/20 0319     Updated: 01/21/20 0524     EGFR 7.9    Comprehensive metabolic panel [[id number]]  (Abnormal) Collected: 01/21/20 0319    Specimen: Blood Updated: 01/21/20 0524     Glucose 258 mg/dL      BUN 33.0 mg/dL      Creatinine 6.4 mg/dL      Sodium 135 mEq/L      Potassium 4.2 mEq/L      Chloride 98 mEq/L      CO2 26 mEq/L      Calcium 8.0 mg/dL      Protein, Total 5.8 g/dL      Albumin 2.2 g/dL      AST (SGOT) 5 U/L      ALT 6 U/L      Alkaline Phosphatase 89 U/L      Bilirubin, Total 0.5 mg/dL      Globulin 3.6 g/dL      Albumin/Globulin Ratio 0.6     Anion Gap 11.0    Troponin I [[id number]] Collected: 01/21/20 0319    Specimen: Blood Updated: 01/21/20 0514     Troponin I 0.02 ng/mL     CBC without differential  [[id number]]  (Abnormal) Collected: 01/21/20 0319    Specimen: Blood Updated: 01/21/20 0456     WBC 4.25 x10 3/uL      Hgb 7.6 g/dL      Hematocrit 25.2 %      Platelets 115 x10 3/uL      RBC 2.55 x10 6/uL      MCV 98.8 fL      MCH 29.8 pg      MCHC 30.2 g/dL      RDW 15 %      MPV 12.4 fL      Nucleated RBC 0.0 /100 WBC      Absolute NRBC 0.00 x10 3/uL     CBC without differential [[id number]] Collected: 01/21/20 0319  Specimen: Blood Updated: 01/21/20 0328    Comprehensive metabolic panel [[id number]] Collected: 01/21/20 0319    Specimen: Blood Updated: 01/21/20 0328    Glucose Whole Blood - POCT [[id number]]  (Abnormal) Collected: 01/20/20 2057     Updated: 01/20/20 2100     Whole Blood Glucose POCT 307 mg/dL     Glucose Whole Blood - POCT [[id number]]  (Abnormal) Collected: 01/20/20 1532     Updated: 01/20/20 1543     Whole Blood Glucose POCT 171 mg/dL     Glucose Whole Blood - POCT [[id number]]  (Abnormal) Collected: 01/20/20 1145     Updated: 01/20/20 1156     Whole Blood Glucose POCT 217 mg/dL     Glucose Whole Blood - POCT [[id number]]  (Abnormal) Collected: 01/20/20 0721     Updated: 01/20/20 0808     Whole Blood Glucose POCT 262 mg/dL     COVID-19 (SARS-COV-2) ([facility]) [[id number]] Collected: 01/20/20 0013    Specimen: Nasopharyngeal Swab from Nasopharynx Updated: 01/20/20 0049     Purpose of COVID testing Screening     SARS-CoV-2 Specimen Source Nasopharyngeal     SARS CoV 2 Overall Result Negative    Narrative:      o Collect and clearly label specimen type:  o Upper respiratory specimen: One Nasopharyngeal Dry Swab NO  Transport Media.  o Hand deliver to laboratory ASAP  Indication for testing->Extended care facility admission to  semi private room  Screening    Troponin I [[id number]]  (Abnormal) Collected: 01/19/20 2202     Updated: 01/19/20 2232     Troponin I 0.06 ng/mL     Comprehensive metabolic panel [[id number]]  (Abnormal) Collected: 01/19/20 2202    Specimen: Blood Updated: 01/19/20 2226      Glucose 329 mg/dL      BUN 73.0 mg/dL      Creatinine 10.4 mg/dL      Sodium 135 mEq/L      Potassium 5.4 mEq/L      Chloride 104 mEq/L      CO2 17 mEq/L      Calcium 8.2 mg/dL      Protein, Total 7.2 g/dL      Albumin 2.8 g/dL      AST (SGOT) 6 U/L      ALT 11 U/L      Alkaline Phosphatase 122 U/L      Bilirubin, Total 0.5 mg/dL      Globulin 4.4 g/dL      Albumin/Globulin Ratio 0.6     Anion Gap 14.0    Hemolysis index [[id number]] Collected: 01/19/20 2202     Updated: 01/19/20 2226     Hemolysis Index 1    GFR [[id number]] Collected: 01/19/20 2202     Updated: 01/19/20 2226     EGFR 4.5    CBC and differential [[id number]]  (Abnormal) Collected: 01/19/20 2202    Specimen: Blood Updated: 01/19/20 2210     WBC 8.22 x10 3/uL      Hgb 9.3 g/dL      Hematocrit 31.6 %      Platelets 125 x10 3/uL      RBC 3.21 x10 6/uL      MCV 98.4 fL      MCH 29.0 pg      MCHC 29.4 g/dL      RDW 15 %      MPV 11.7 fL      Neutrophils 78.3 %      Lymphocytes Automated 13.3 %  Monocytes 7.1 %      Eosinophils Automated 0.7 %      Basophils Automated 0.2 %      Immature Granulocytes 0.4 %      Nucleated RBC 0.0 /100 WBC      Neutrophils Absolute 6.44 x10 3/uL      Lymphocytes Absolute Automated 1.09 x10 3/uL      Monocytes Absolute Automated 0.58 x10 3/uL      Eosinophils Absolute Automated 0.06 x10 3/uL      Basophils Absolute Automated 0.02 x10 3/uL      Immature Granulocytes Absolute 0.03 x10 3/uL      Absolute NRBC 0.00 x10 3/uL         Nephrology progress note:  Assessment:     1. ESRD on HD MWF at [facility]  - was discharged to a covid HD unit while she was in quarantine  - reported last HD on Wednesday   2. Mild hyperkalemia - resolved  3. Metabolic acidosis due to ESRD  4. Anemia of CKD  5. Recent C.diff infection requiring fidaxomicin  6. HTN, better with HD    Recommendations:     Presently being dialyzed, aiming for 3L net ultrafiltration    Dialysis Treatment Data:  Blood flow rate: 350 mL/min  Dialysate flow  rate: 700 mL/min  Ultrafiltration rate: 1090 mL/hr    Dialysate potassium: 2 mEq/L  Dialysate calcium: 2.5 mEq/L    Arterial pressure: -238 mmHg  Venous pressure: 172mmHg    S/p HD yesterday with 4L net ultrafiltration  Stable for discharge from renal perspective  Since she tested negative for Covid 19 yesterday, she will go back to [facility] on Friday for routine HD  Scheduled to get first Covid vaccine next Tuesday      Scheduled Meds:  Current Facility-Administered Medications   Medication Dose Route Frequency    amLODIPine  10 mg Oral Daily    atorvastatin  40 mg Oral Daily    gabapentin  600 mg Oral TID    heparin (porcine)  5,000 Units Subcutaneous Q8H [place]    insulin glargine  20 Units Subcutaneous QHS    insulin lispro  1-5 Units Subcutaneous TID AC    pantoprazole  40 mg Oral QAM AC    sevelamer  800 mg Oral TID MEALS     Continuous Infusions:  PRN Meds:.acetaminophen, bisacodyl, Nursing communication: Adult Hypoglycemia Treatment Algorithm **AND** dextrose **AND** dextrose **AND** glucagon (rDNA), lidocaine, melatonin, naloxone, nystatin, ondansetron, ondansetron **OR** ondansetron      Intake/Output Summary (Last 24 hours) at 01/21/2020 1500  Last data filed at 01/21/2020 1220  Gross per 24 hour   Intake 360 ml   Output 6500 ml   Net -6140 ml         UTILIZATION REVIEW CONTACT: Mindi Junker, UR RN  Clinical case manager- Utilization review  NPI: [id number]  Tax ID: [id number]  Phone: [phone]  Fax: [phone]  Email: [email or web address]

## 2020-01-21 NOTE — Progress Notes (Signed)
[facility] PROGRESS NOTE  Aaron Edelman, x [phone] [facility] Spectralink)      Date Time: 01/21/20 8:24 AM  Patient Name: [name]  Attending Physician: Lisa Roca, MD    CC: follow-up ESRD, provision of hemodialysis   HD note  Assessment:     1. ESRD on HD MWF at [facility]   - was discharged to a covid HD unit while she was in quarantine   - reported last HD on Wednesday   2. Mild hyperkalemia - resolved  3. Metabolic acidosis due to ESRD  4. Anemia of CKD  5. Recent C.diff infection requiring fidaxomicin  6. HTN, better with HD    Recommendations:     Presently being dialyzed, aiming for 3L net ultrafiltration    Dialysis Treatment Data:  Blood flow rate: 350 mL/min  Dialysate flow rate: 700 mL/min  Ultrafiltration rate: 1090 mL/hr    Dialysate potassium: 2 mEq/L  Dialysate calcium: 2.5 mEq/L    Arterial pressure: -238 mmHg  Venous pressure: 170mmHg    S/p HD yesterday with 4L net ultrafiltration  Stable for discharge from renal perspective  Since she tested negative for Covid 19 yesterday, she will go back to [facility] on Friday for routine HD  Scheduled to get first Covid vaccine next Tuesday      Case discussed with: patient, HD RN    Verl Blalock, MD, MD  [facility]  [phone] (office)  [phone] ([place])      Subjective: seen on hemodialysis, hemodynamically stable, tolerating treatment well    Review of Systems:   Cardio: no chest pain, no edema  Pulm: no SOB  GI:no nausea or vomiting    Physical Exam:     Vitals:    01/21/20 0730 01/21/20 0745 01/21/20 0800 01/21/20 0815   BP: 107/66 97/62 101/66 103/61   Pulse: 79 83 90 86   Resp: 17 18 18 16    Temp:       TempSrc:       SpO2: 95% 94% 94% 95%   Weight:       Height:           Intake and Output Summary (Last 24 hours) at Date Time    Intake/Output Summary (Last 24 hours) at 01/21/2020 0824  Last data filed at 01/20/2020 1700  Gross per 24 hour   Intake 320 ml   Output 4250 ml   Net  -3930 ml       General: awake, alert, oriented x 3, no acute distress.  Cardiovascular: regular rate and rhythm  Lungs: clear to auscultation bilaterally, bilateral air entry, normal work of breathing  Abdomen: soft  Extremities: no edema, LUE AVF + thrill + bruit      Meds:      Scheduled Meds: PRN Meds:    amLODIPine, 10 mg, Oral, Daily  atorvastatin, 40 mg, Oral, Daily  gabapentin, 600 mg, Oral, TID  heparin (porcine), 5,000 Units, Subcutaneous, Q8H SCH  insulin glargine, 20 Units, Subcutaneous, QHS  insulin lispro, 1-5 Units, Subcutaneous, TID AC  pantoprazole, 40 mg, Oral, QAM AC  sevelamer, 800 mg, Oral, TID MEALS          Continuous Infusions:   acetaminophen, 650 mg, Q6H PRN  bisacodyl, 5 mg, Daily PRN  dextrose, 15 g of glucose, PRN   And  dextrose, 12.5 g, PRN   And  glucagon (rDNA), 1 mg, PRN  lidocaine, , PRN  melatonin, 3 mg,  QHS PRN  naloxone, 0.2 mg, PRN  nystatin, 1 application, BID PRN  ondansetron, 4 mg, Once PRN  ondansetron, 4 mg, Q6H PRN   Or  ondansetron, 4 mg, Q6H PRN              Labs:     Recent Labs   Lab 01/21/20  0319 01/19/20  2202   WBC 4.25 8.22   Hgb 7.6* 9.3*   Hematocrit 25.2* 31.6*   Platelets 115* 125*     Recent Labs   Lab 01/21/20  0319 01/19/20  2202   Sodium 135* 135*   Potassium 4.2 5.4*   Chloride 98* 104   CO2 26 17*   BUN 33.0* 73.0*   Creatinine 6.4* 10.4*   Calcium 8.0* 8.2*   Albumin 2.2* 2.8*   Glucose 258* 329*   EGFR 7.9 4.5         Signed by: Verl Blalock, MD, MD

## 2020-01-21 NOTE — Progress Notes (Signed)
01/21/20 1500   Volunteer Chaplain   Visit Type Follow-up   Source Chaplain Initiated;Staff Request   Present at Visit patient   Spiritual Care Provided to patient only   Reason for Request Spiritual Support   Spiritual Care Interventions Provided prayer;Provided spiritual resources   Spiritual Care Outcomes Patient expressed appreciation of visit   Length of Visit 16-30 minutes   Follow-up Follow-up routine (3)

## 2020-01-21 NOTE — Progress Notes (Signed)
Nutritional Support Services  Nutrition Assessment    [name] 64 y.o. female   MRN: [id number]    Summary of Nutrition Recommendations:    1. Continue consistent carb & renal diet as tolerated, liberalized protein restriction d/t increased needs on dialysis    2. Provide 1 Nepro shake PO, BID to optimize intake   Each Nepro shake provides 425 kcal, 19 gm protein, 250 mg potassium and 170 mg phosphorus.    3. Daily weights - standing scale preferred, if feasible    4. Rec nephro-vite daily    5. Adjust insulin as needed for optimal glycemic control, goal < 180 mg/dL        D/w RN Brandon Melnick, messaged MD [place]   -----------------------------------------------------------------------------------------------------------------                                                      Assessment Data:     Referral Source: RN screen  Reason for Referral: MST 2 (unsure weight loss)    Nutrition: Pt awake/alert during assessment. Reports good appetite and PO intake, actively eating lunch during assessment. Pt previously had poor intake when hospitalized with COVID-19 and C. Diff, now resolved with average intake ~70% of meals during admission per flowsheet. Denies any GI distress and is having formed BMs. Denies any difficulty chewing or swallowing. Pertinent labs detailed below. Will follow at moderate nutrition risk.     Learning Needs: Pt appears knowledgeable about renal & consistent carb diet restrictions, denies any questions at this time. Linked resources to AVS. Pt drinks Nepro shakes during HD and agreeable to have 2 daily while admitted.     Hospital Admission: 64 yo female presented 12/28 with hypertensive urgency and wrist pain with associated numbness and tingling. Notable history of ESRD on HD, COVID-19 (dx on 12/31/19), COPD, DM, & HTN.    Medical Hx:  has a past medical history of Chronic obstructive pulmonary disease, Diabetes mellitus, Hyperlipidemia, Hypertension, and Sleep apnea (2018).    PSH: has a  past surgical history that includes Ovary surgery (Left); Joint replacement; Joint replacement; EGD, BIOPSY (N/A, 05/14/2019); Tunneled Cath Placement (Permcath) (Bilateral, 05/23/2019); REMOVAL, FOREIGN BODY (N/A, 05/27/2019); EXPLORATORY LAPAROTOMY (N/A, 05/27/2019); Tunneled Cath Check/Change (Permcath) (N/A, 07/03/2019); and Tunneled Cath Placement (Permcath) (Right, 07/31/2019).     Orders Placed This Encounter   Procedures   . Diet consistent carbohydrate and renal Protein restriction: No Protein Restriction   . Nepro Shake Quantity: A. One; Flavor: Data processing manager; Frequency: BID (2 times a day) - with Breakfast and Dinner     Intake:     01/20/20 0400 01/20/20 0829 01/20/20 1400   Percent Meal Consumed (%) Other  (water) 50% 75%      01/20/20 1700 01/21/20 1220   Percent Meal Consumed (%) 50% 100%     ANTHROPOMETRIC  Height: 157.5 cm (5' 2.01")  Weight: 105.4 kg (232 lb 5.8 oz)  Weight Change: 3.94  Body mass index is 42.49 kg/m.   Ideal body weight: 50.1 kg (110 lb 7.9 oz)    Weight Monitoring Weight Weight Method   07/21/2019 107.321 kg Standing Scale   07/21/2019 104.8 kg    07/22/2019 107.2 kg Bed Scale   07/23/2019 108.8 kg Bed Scale   07/26/2019 108.8 kg    12/31/2019 98.294 kg Bed Scale   12/31/2019  92.6 kg    01/02/2020 47.6 kg Bed Scale   01/03/2020 97.977 kg Bed Scale   01/04/2020 98.294 kg Bed Scale   01/05/2020 48.9 kg    01/06/2020 98.3 kg Bed Scale   01/07/2020 98.431 kg Bed Scale   01/19/2020 98.431 kg    01/20/2020 101.4 kg    01/21/2020 105.4 kg Bed Scale      Weight History Summary: Noted drastic weight fluctuations over the past 6 months, likely influenced by fluid loss with HD. Nutrition needs calculated based on estimated dry weight. Will monitor.     Physical Assessment: 12/29  Head: temple region: slight depression (mild muscle loss) and buccal region: flat cheeks, minimal "bounce" (mild fat loss)  Upper Body: no overt s/s subcutaneous muscle or fat loss  Lower Body: deferred d/t tray over lap  Edema:  none per flowsheet  Skin: MASD per flowsheet  GI function: abd soft/nd, BS active, passing flatus, last BM 12/28 per flowsheet    ESTIMATED NEEDS    Total Daily Energy Needs: 1470 to 1960 kcal  Method for Calculating Energy Needs: 15 kcal - 20 kcal per kg  at 98 kg (Actual body weight)  Rationale: obese BMI, noncritical, HD    Total Daily Protein Needs: 75 to 100 g  Method for Calculating Protein Needs: 1.5 g - 2 g per kg at 50 kg (Ideal body weight)  Rationale: obese BMI, noncritical, HD, no PI/DTI    Total Daily Fluid Needs: 1000 to 1250 ml  Method for Calculating Fluid Needs: 20 ml - 25 ml  per kg at 50 kg (Ideal body weight)  Rationale: BMI, age, HD - adjustments per MD    Pertinent Medications: gabapentin, renvela, insulin regimen, protonix, lipitor     Allergies   Allergen Reactions   . Lisinopril Hives   . Metformin Hives     Pertinent labs:    Lab 01/21/20  0319 01/19/20  2202   Sodium 135* 135*   Potassium 4.2 5.4*   Chloride 98* 104   CO2 26 17*   BUN 33.0* 73.0*   Creatinine 6.4* 10.4*   Glucose 258* 329*   Calcium 8.0* 8.2*   Magnesium 1.8  --    Phosphorus 4.6  --    EGFR 7.9 4.5                                                                 Nutrition Diagnosis       Altered Nutrition related lab values related to ESRD on HD, DM as evidenced by BUN 33, Cr 6.4, GFR 7.9, BG range 171-307 x 24 hrs, HgbA1c 10.2 (01/01/20). - new                                                              Intervention     Nutrition recommendation - Please refer to top of note  Monitoring/Evaluation     Goals: PO intake/tolerance > 75% of meals/supplements by f/u - new    Nutrition Risk Level: Moderate (will follow up at least 1 time per week and PRN)       Adin Hector, RD, [place]  Clinical Dietitian  Ext. [phone]

## 2020-01-21 NOTE — Progress Notes (Signed)
HD tx. Completed ; Net fluid off 2.5L; Unable to achieve UF Goal ordered - pt. C/o she's not feeling good.; SBP on the low 100's post HD; Lt. AVG worked well; Pt. Cleared for discharge from Nephro side per Dr. Conley Canal. Rt. Wrist pain remain unresolved. Report given to Durwin Reges RN.     01/21/20 1056   Treatment Summary   Time Off Machine 1045   Duration of Treatment (Hours) 3.5   Treatment Type 1:1   Dialyzer Clearance Moderately streaked   Fluid Volume Off (mL) 2900   Prime Volume (mL) 200   Rinseback Volume (mL) 200   Fluid Given: Normal Saline (mL) 0   Fluid Given: PRBC  0 mL   Fluid Given: Albumin (mL) 0   Fluid Given: Other (mL) 0   Total Fluid Given 400   Hemodialysis Net Fluid Removed 2500   Post Treatment Assessment   Post-Treatment Weight (Kg) -2.5   Patient Response to Treatment tolerated    Additional Dialyzer Used 0   Graft/Fistula Left   No placement date or time found.   Access Type: Arteriovenous Fistula  Orientation: Left  Graft/Fistula Location: Upper Arm   Fistula/ Graft Assessment Abnormalities WDL   Needle Size 15 Gauge   Cannulation Sites held Arterial (min) 8   Cannulation Sites held Venous (min) 8   Hemostasis Achieved Yes   Vitals   Temp 97.8 F (36.6 C)   Heart Rate 85   Resp Rate 16   BP 104/66   SpO2 95 %   O2 Device None (Room air)   Assessment   Mental Status Alert;Oriented   Cardiac (WDL) WDL   Cardiac Regularity Regular   Cardiac Symptoms None   Cardiac Rhythm Normal Sinus Rhythm   Respiratory  WDL   Respiratory Pattern Regular   Bilateral Breath Sounds Clear   Cough Spontaneous   Edema  WDL   General Skin Color Appropriate for ethnicity   Skin Condition/Temp Warm;Dry   Gastrointestinal (WDL) WDL   Abdomen Inspection Soft   GI Symptoms None   Mobility Ambulatory with Assistance   Pain Assessment   Charting Type Reassessment   Pain Scale Used Numeric Scale (0-10)   Numeric Pain Scale   Pain Score 5   POSS Score 1   Pain Location Wrist   Pain Orientation Right   Pain Descriptors  Aching   Pain Frequency Continuous   Effect of Pain on Daily Activities mild   Patient's Stated Comfort Functional Goal 0   Pain Intervention(s) Medication;Extremity elevation;Splintling   Multiple Pain Sites No   Education   Person taught Patient   Knowledge basis Minimal   Topics taught Access care;Fluid Management   Teaching Tools Explain   [name] Understanding   Bedside Nurse Communication   Name of bedside RN - post dialysis [id number]

## 2020-01-21 NOTE — Progress Notes (Signed)
Pre HD report received from Prim. RN; Pt. Awake a/ox3; Had a good sleep as stated; SBP on the 90's pre HD- asymptomatic; c/o rt. Wrist pain (carpal tunnel)- wrist brace applied. Lt. AVG - has no cannulation problem; w/ good flow. Will monitor closely while on HD.   01/21/20 0700   Bedside Nurse Communication   Name of bedside RN - pre dialysis Paige Kochenowner   Treatment Initiation- With Dialysis Precautions   Time Out/Safety Check Completed Yes   Consent for HD signed for this hospitalization (Date) 05/13/19   Consent for HD signed for this hospitalization (Time) 1330   Preferred language No   Blood Consent Verified N/A   Dialysis Precautions All Connections Secured   Dialysis Treatment Type Routine;Bedside   Special Considerations Contact Isolation   Is patient diabetic? Yes   RO/Hemodialysis Architectural technologist   Is Total Chlorine less than 0.1 ppm? Yes   Orignial Total Chlorine Testing Time 0650   At 4 Hour Total Chlorine Testing Time 1050   RO/Hemodialysis Facilities manager Number 2    Machine Serial Number [id number]   RO # [id number]   RO Serial # [id number]   Water Hardness NA   pH 7   Pressure Test Verified Yes   Alarms Verified Passed   Machine Temperature 96.7 F (35.9 C)   Alarms Verified Yes   Na+ mEq (Machine) 138 mEq   Bicarb mEq (Machine) 35 mEq   Hemodialysis Conductivity (Machine) 13.8   Hemodialysis Conductivity (Meter) 14   Dialyzer Lot Number [id number]   Tubing Lot Number [id number]   RO Machine Log Completed Yes   Hepatitis Status   HBsAg (Antigen) Result Negative   HBsAg Date Drawn 01/02/20   HBsAg Repeat Draw Due Date 01/30/19   Dialysis Weight   Pre-Treatment Weight (Kg) 100.2   Scale Type ICU Bed Scale   Vitals   Temp 97.8 F (36.6 C)   Resp Rate 16   BP 98/56   SpO2 95 %   O2 Device None (Room air)   Assessment   Mental Status Alert;Oriented;Cooperative   Cardiac (WDL) WDL   Cardiac Regularity Regular   Cardiac Symptoms None   Cardiac Rhythm Normal Sinus Rhythm   Respiratory  WDL    Respiratory Pattern Regular   Bilateral Breath Sounds Clear   Cough Spontaneous   Edema  WDL   General Skin Color Appropriate for ethnicity   Skin Condition/Temp Warm;Dry   Gastrointestinal (WDL) WDL   Abdomen Inspection Soft;Nondistended   GI Symptoms None   Mobility Ambulatory with Assistance   Pain Assessment   Charting Type Assessment   Pain Scale Used Numeric Scale (0-10)   Numeric Pain Scale   Pain Score 7   POSS Score 1   Pain Location Wrist   Pain Orientation Right   Pain Descriptors Aching   Pain Frequency Continuous;Increases with movement   Effect of Pain on Daily Activities mild   Patient's Stated Comfort Functional Goal 0   Pain Intervention(s) Extremity elevation;Splintling   Multiple Pain Sites No   Hemodialysis Comments   Pre-Hemodialysis Comments Timeout procedure- done   Hemodialysis History Information   [facility] D[facility]   Chronic Dialysis Patient Yes   Outpatient Nephrologist Dr. Conley Canal

## 2020-01-22 LAB — CBC
Absolute NRBC: 0 10*3/uL (ref 0.00–0.00)
Hematocrit: 26.8 % — ABNORMAL LOW (ref 34.7–43.7)
Hgb: 7.8 g/dL — ABNORMAL LOW (ref 11.4–14.8)
MCH: 29.1 pg (ref 25.1–33.5)
MCHC: 29.1 g/dL — ABNORMAL LOW (ref 31.5–35.8)
MCV: 100 fL — ABNORMAL HIGH (ref 78.0–96.0)
MPV: 12.5 fL (ref 8.9–12.5)
Nucleated RBC: 0 /100 WBC (ref 0.0–0.0)
Platelets: 135 10*3/uL — ABNORMAL LOW (ref 142–346)
RBC: 2.68 10*6/uL — ABNORMAL LOW (ref 3.90–5.10)
RDW: 15 % (ref 11–15)
WBC: 4.02 10*3/uL (ref 3.10–9.50)

## 2020-01-22 LAB — COMPREHENSIVE METABOLIC PANEL
ALT: 7 U/L (ref 0–55)
AST (SGOT): 5 U/L (ref 5–34)
Albumin/Globulin Ratio: 0.6 — ABNORMAL LOW (ref 0.9–2.2)
Albumin: 2.3 g/dL — ABNORMAL LOW (ref 3.5–5.0)
Alkaline Phosphatase: 96 U/L (ref 37–106)
Anion Gap: 12 (ref 5.0–15.0)
BUN: 33 mg/dL — ABNORMAL HIGH (ref 7.0–19.0)
Bilirubin, Total: 0.3 mg/dL (ref 0.2–1.2)
CO2: 26 mEq/L (ref 22–29)
Calcium: 8.1 mg/dL — ABNORMAL LOW (ref 8.5–10.5)
Chloride: 99 mEq/L — ABNORMAL LOW (ref 100–111)
Creatinine: 5.4 mg/dL — ABNORMAL HIGH (ref 0.6–1.0)
Globulin: 3.7 g/dL — ABNORMAL HIGH (ref 2.0–3.6)
Glucose: 270 mg/dL — ABNORMAL HIGH (ref 70–100)
Potassium: 4.5 mEq/L (ref 3.5–5.1)
Protein, Total: 6 g/dL (ref 6.0–8.3)
Sodium: 137 mEq/L (ref 136–145)

## 2020-01-22 LAB — GFR: EGFR: 9.6

## 2020-01-22 LAB — HEMOLYSIS INDEX: Hemolysis Index: 5 (ref 0–18)

## 2020-01-22 LAB — GLUCOSE WHOLE BLOOD - POCT
Whole Blood Glucose POCT: 199 mg/dL — ABNORMAL HIGH (ref 70–100)
Whole Blood Glucose POCT: 277 mg/dL — ABNORMAL HIGH (ref 70–100)

## 2020-01-22 MED ORDER — GABAPENTIN 600 MG PO TABS
600.0000 mg | ORAL_TABLET | Freq: Three times a day (TID) | ORAL | 0 refills | Status: DC
Start: 2020-01-22 — End: 2020-10-20

## 2020-01-22 NOTE — Discharge Instr - AVS First Page (Addendum)
Reason for your Hospital Admission:  You had fluid overload because of missing dialysis      Instructions for after your discharge:  Take your medication and follow-up with dialysis as recommended Monday Wednesday Friday schedule

## 2020-01-22 NOTE — Progress Notes (Signed)
DISCHARGE: Patient will discharge to home today and will resume OP HD as advised.  [facility] appointment ordered for follow-up as PCP is not verified.  Patient will arrange her own transport.       01/22/20 1410   Discharge Disposition   Patient preference/choice provided? Yes   Physical Discharge Disposition Home   Mode of Transportation Car   Patient/Family/POA notified of transfer plan Yes   Patient agreeable to discharge plan/expected d/c date? Yes   Family/POA agreeable to discharge plan/expected d/c date? Yes   Bedside nurse notified of transport plan? Yes   CM Interventions   Follow up appointment scheduled? Yes  ([place] appointment ordered for medical follow-up as PCP is not verified.)   Is this a Medicare focused or COVID patient? No   Is this appointment within 48-72 hours? Yes   Multidisciplinary rounds/family meeting before d/c? Yes   Medicare Checklist   Is this a Medicare patient? No     Memory Argue, MSW, ACM-SW  Social Worker Case Manager [facility]  [phone]

## 2020-01-22 NOTE — Progress Notes (Signed)
[facility] PROGRESS NOTE  Aaron Edelman, x [phone] [facility] Spectralink)      Date Time: 01/22/20 8:18 AM  Patient Name: [name]  Attending Physician: Lisa Roca, MD    CC: follow-up ESRD, provision of hemodialysis     Assessment:     1. ESRD on HD MWF at [facility]   - was discharged to a covid HD unit while she was in quarantine  2. Mild hyperkalemia - resolved  3. Metabolic acidosis due to ESRD - resolved  4. Anemia of CKD - low but stable   5. Recent C.diff infection requiring fidaxomicin  6. HTN, better with HD    Recommendations:     S/p HD yesterday with 2.5L net ultrafiltration; unable to achieve 3L net UF due to soft BP  Will received EPO and iron at her OP HD unit  Stable for discharge from renal perspective   She will go back to [facility] for HD upon discharge   Scheduled to get first Covid vaccine next Tuesday    Case discussed with: patient    Verl Blalock, MD, MD  [facility]  [phone] (office)  X [phone] ([place])      Subjective: doing well, feels much better, hoping to be able to go home today     Review of Systems:   Cardio: no chest pain, no edema  Pulm: no SOB  GI:no nausea or vomiting    Physical Exam:     Vitals:    01/21/20 1946 01/21/20 2320 01/22/20 0303 01/22/20 0731   BP:  125/76 113/64 119/69   Pulse: 92 93 88 82   Resp:  18 18 16    Temp: 97.3 F (36.3 C) 97.5 F (36.4 C) 98.1 F (36.7 C) 97.5 F (36.4 C)   TempSrc: Oral Oral Oral Oral   SpO2: 93% 95% 95% 95%   Weight:   98.6 kg (217 lb 6 oz)    Height:           Intake and Output Summary (Last 24 hours) at Date Time    Intake/Output Summary (Last 24 hours) at 01/22/2020 0818  Last data filed at 01/21/2020 1611  Gross per 24 hour   Intake 240 ml   Output 2900 ml   Net -2660 ml       General: awake, alert, oriented x 3, no acute distress.  Cardiovascular: regular rate and rhythm  Lungs: clear to auscultation bilaterally, bilateral air entry, normal work of  breathing  Abdomen: soft  Extremities: no edema, LUE AVF + thrill + bruit      Meds:      Scheduled Meds: PRN Meds:    amLODIPine, 10 mg, Oral, Daily  atorvastatin, 40 mg, Oral, Daily  gabapentin, 600 mg, Oral, TID  heparin (porcine), 5,000 Units, Subcutaneous, Q8H [facility]  insulin glargine, 20 Units, Subcutaneous, QHS  insulin lispro, 1-4 Units, Subcutaneous, QHS  insulin lispro, 1-5 Units, Subcutaneous, TID AC  pantoprazole, 40 mg, Oral, QAM AC  sevelamer, 800 mg, Oral, TID MEALS          Continuous Infusions:   acetaminophen, 650 mg, Q6H PRN  bisacodyl, 5 mg, Daily PRN  dextrose, 15 g of glucose, PRN   And  dextrose, 12.5 g, PRN   And  glucagon (rDNA), 1 mg, PRN  lidocaine, , PRN  melatonin, 3 mg, QHS PRN  naloxone, 0.2 mg, PRN  nystatin, 1 application, BID PRN  ondansetron, 4 mg, Once PRN  ondansetron, 4 mg, Q6H PRN   Or  ondansetron, 4 mg, Q6H PRN              Labs:     Recent Labs   Lab 01/22/20  0305 01/21/20  0319 01/19/20  2202   WBC 4.02 4.25 8.22   Hgb 7.8* 7.6* 9.3*   Hematocrit 26.8* 25.2* 31.6*   Platelets 135* 115* 125*     Recent Labs   Lab 01/22/20  0305 01/21/20  0319 01/19/20  2202   Sodium 137 135* 135*   Potassium 4.5 4.2 5.4*   Chloride 99* 98* 104   CO2 26 26 17*   BUN 33.0* 33.0* 73.0*   Creatinine 5.4* 6.4* 10.4*   Calcium 8.1* 8.0* 8.2*   Albumin 2.3* 2.2* 2.8*   Phosphorus  --  4.6  --    Magnesium  --  1.8  --    Glucose 270* 258* 329*   EGFR 9.6 7.9 4.5         Signed by: Verl Blalock, MD, MD

## 2020-01-22 NOTE — UM Notes (Signed)
12.30.2021 concurrent obs review: Still has intermittent dyspnea and feels fatigued.     01/21/20 1611   BP: 101/64   Pulse: 91   Resp: 16   Temp: 97.5 F (36.4 C)   SpO2: 92%          Temperature: Temp  Min: 97.5 F (36.4 C)  Max: 100.4 F (38 C)  Pulse: Pulse  Min: 79  Max: 94  Respiratory: Resp  Min: 14  Max: 20  Non-Invasive BP: BP  Min: 93/57  Max: 121/70  Pulse Oximetry SpO2  Min: 91 %  Max: 96 %    Intake and Output Summary (Last 24 hours) at Date Time    Intake/Output Summary (Last 24 hours) at 01/21/2020 1812  Last data filed at 01/21/2020 1611      Gross per 24 hour   Intake 240 ml   Output 2900 ml   Net -2660 ml     Lab 01/21/20  0319   WBC 4.25   RBC 2.55*   Hgb 7.6*   Hematocrit 25.2*   MCV 98.8*   Platelets 115*     Sodium 135*   Chloride 98*   BUN 33.0*   Creatinine 6.4*   Glucose 258*   Calcium 8.0*     Albumin 2.2*     Assessment and Plan  Hypertensive urgency (01/19/2020)  Due to a combination of fluid overload and noncompliance with dialysis  Patient seen by nephrology much appreciated   blood pressure has improved due to medication adjustments as well as hemodialysis and ultrafiltration    End-stage renal disease  Continue Monday Wednesday Friday schedule  Patient underwent dialysis again today    Patient claims she feels weak and fatigued after dialysis  We'll monitor overnight and if stable and hemoglobin is stable we'll plan to discharge home tomorrow    Anemia  Due to CKD  Continue EPO per protocol.    Hyperkalemia  Most likely due to noncompliance with dialysis  Follow-up with serial chemistries    Metabolic acidosis  Due to CKD  Expect to improve with dialysis and ultrafiltration    Fluid overload  Continue dialysis and ultrafiltration  Follow-up and imaging studies      C. difficile by history  Treated with fidaxomicin  No further diarrhea.    Diabetes mellitus  Continue Lantus and sliding scale  Monitor blood sugar and adjust medications    COVID-19  infection  Currently asymptomatic    Morbid obesity  Diet exercise weight loss and lifestyle modification    Mild elevation of troponin  No chest pain   probably due to end-stage renal disease.    Peripheral neuropathy  Continue Neurontin    Plan discussed with patient and bedside RN    Analgesia: Tylenol and Neurontin    Nutrition: Mechanical soft diet    DVT Prophylaxis: SCDs       Code Status: Full code    DISPO: To be determined anticipate discharge to home medically stable    12.30.2021 concurrent obs review:  Extremities: no edema, LUE AVF + thrill + bruit    Assessment:    1. ESRD on HD MWF at [facility]  - was discharged to a covid HD unit while she was in quarantine  2. Mild hyperkalemia - resolved  3. Metabolic acidosis due to ESRD - resolved  4. Anemia of CKD - low but stable   5. Recent C.diff infection requiring fidaxomicin  6. HTN, better with HD  Recommendations:    S/p HD yesterday with 2.5L net ultrafiltration; unable to achieve 3L net UF due to soft BP  Will received EPO and iron at her OP HD unit  Stable for discharge from renal perspective   She will go back to [facility] for HD upon discharge   Scheduled to get first Covid vaccine next Tuesday    Chloride 99*   CO2 26   BUN 33.0*   Creatinine 5.4*   Calcium 8.1*   Albumin 2.3*   Glucose 270*     Hgb 7.8*   Hematocrit 26.8*   Platelets 135*     Scheduled Meds:  Current Facility-Administered Medications   Medication Dose Route Frequency    amLODIPine  10 mg Oral Daily    atorvastatin  40 mg Oral Daily    gabapentin  600 mg Oral TID    heparin (porcine)  5,000 Units Subcutaneous Q8H [place]    insulin glargine  20 Units Subcutaneous QHS    insulin lispro  1-4 Units Subcutaneous QHS    insulin lispro  1-5 Units Subcutaneous TID AC    pantoprazole  40 mg Oral QAM AC    sevelamer  800 mg Oral TID MEALS     PRN Meds:.acetaminophen, bisacodyl, Nursing communication: Adult Hypoglycemia Treatment Algorithm **AND**  dextrose **AND** dextrose **AND** glucagon (rDNA), lidocaine, melatonin, naloxone, nystatin, ondansetron, ondansetron **OR** ondansetron

## 2020-01-22 NOTE — Plan of Care (Signed)
Problem: Moderate/High Fall Risk Score >5  Goal: Patient will remain free of falls  01/22/2020 1403 by Bethann Goo, RN  Outcome: Adequate for Discharge  01/22/2020 1351 by Bethann Goo, RN  Outcome: Progressing  Flowsheets (Taken 01/22/2020 0800)  Moderate Risk (6-13):   MOD-Consider activation of bed alarm if appropriate   MOD-Apply bed exit alarm if patient is confused   MOD-Floor mat at bedside (where available) if appropriate   MOD-Consider a move closer to [place]   MOD-Remain with patient during toileting     Problem: Safety  Goal: Patient will be free from injury during hospitalization  01/22/2020 1403 by Bethann Goo, RN  Outcome: Adequate for Discharge  01/22/2020 1351 by Bethann Goo, RN  Outcome: Progressing  Flowsheets (Taken 01/22/2020 1351)  Patient will be free from injury during hospitalization:   Provide and maintain safe environment   Assess patient's risk for falls and implement fall prevention plan of care per policy   Use appropriate transfer methods   Ensure appropriate safety devices are available at the bedside   Include patient/ family/ care giver in decisions related to safety   Hourly rounding  Goal: Patient will be free from infection during hospitalization  01/22/2020 1403 by Bethann Goo, RN  Outcome: Adequate for Discharge  01/22/2020 1351 by Bethann Goo, RN  Outcome: Progressing  Flowsheets (Taken 01/22/2020 1351)  Free from Infection during hospitalization:   Assess and monitor for signs and symptoms of infection   Monitor all insertion sites (i.e. indwelling lines, tubes, urinary catheters, and drains)   Monitor lab/diagnostic results   Encourage patient and family to use good hand hygiene technique     Problem: Pain  Goal: Pain at adequate level as identified by patient  01/22/2020 1403 by Bethann Goo, RN  Outcome: Adequate for Discharge  01/22/2020 1351 by Bethann Goo, RN  Outcome: Progressing  Flowsheets (Taken 01/22/2020 1351)  Pain at adequate level as identified  by patient:   Identify patient comfort function goal   Assess for risk of opioid induced respiratory depression, including snoring/sleep apnea. Alert healthcare team of risk factors identified.   Assess pain on admission, during daily assessment and/or before any "as needed" intervention(s)   Evaluate patient's satisfaction with pain management progress   Evaluate if patient comfort function goal is met   Reassess pain within 30-60 minutes of any procedure/intervention, per Pain Assessment, Intervention, Reassessment (AIR) Cycle   Offer non-pharmacological pain management interventions     Problem: Side Effects from Pain Analgesia  Goal: Patient will experience minimal side effects of analgesic therapy  01/22/2020 1403 by Bethann Goo, RN  Outcome: Adequate for Discharge  01/22/2020 1351 by Bethann Goo, RN  Outcome: Progressing  Flowsheets (Taken 01/22/2020 1351)  Patient will experience minimal side effects of analgesic therapy:   Monitor/assess patient's respiratory status (RR depth, effort, breath sounds)   Assess for changes in cognitive function     Problem: Discharge Barriers  Goal: Patient will be discharged home or other facility with appropriate resources  Outcome: Adequate for Discharge     Problem: Psychosocial and Spiritual Needs  Goal: Demonstrates ability to cope with hospitalization/illness  Outcome: Adequate for Discharge     Problem: Renal Instability  Goal: Fluid and electrolyte balance are achieved/maintained  01/22/2020 1403 by Bethann Goo, RN  Outcome: Adequate for Discharge  01/22/2020 1351 by Bethann Goo, RN  Outcome: Progressing  Flowsheets (Taken 01/22/2020 1351)  Fluid and electrolyte balance are achieved/maintained:   Monitor intake and output  every shift   Monitor/assess lab values and report abnormal values   Monitor daily weight   Provide adequate hydration   Assess for confusion/personality changes   Observe for seizure activity and initiate seizure precautions if indicated    Observe for cardiac arrhythmias   Assess and reassess fluid and electrolyte status   Monitor for muscle weakness   Follow fluid restrictions/IV/PO parameters  Goal: Nutritional intake is adequate  01/22/2020 1403 by Bethann Goo, RN  Outcome: Adequate for Discharge  01/22/2020 1351 by Bethann Goo, RN  Outcome: Progressing  Flowsheets (Taken 01/22/2020 1351)  Nutritional intake is adequate:   Encourage/perform oral hygiene as appropriate   Assist patient with meals/food selection   Monitor daily weights   Allow adequate time for meals   Encourage/administer dietary supplements as ordered (i.e. tube feed, TPN, oral, OGT/NGT, supplements)   Include patient/patient care companion in decisions related to nutrition   Assess anorexia, appetite, and amount of meal/food tolerated   Consult/collaborate with Speech Therapy (swallow evaluations)  Goal: Free from infection  01/22/2020 1403 by Bethann Goo, RN  Outcome: Adequate for Discharge  01/22/2020 1351 by Bethann Goo, RN  Outcome: Progressing  Flowsheets (Taken 01/22/2020 1351)  Free from infection:   Monitor/assess for signs and symptoms of infection   Assess need for indwelling catheter every shift and discuss with LIP     Problem: Patient Receiving Advanced Renal Therapies  Goal: Therapy access site remains intact  01/22/2020 1403 by Bethann Goo, RN  Outcome: Adequate for Discharge  01/22/2020 1351 by Bethann Goo, RN  Outcome: Progressing  Flowsheets (Taken 01/22/2020 1351)  Therapy access site remains intact: Assess therapy access site

## 2020-01-22 NOTE — Plan of Care (Signed)
Problem: Moderate/High Fall Risk Score >5  Goal: Patient will remain free of falls  Outcome: Progressing  Flowsheets (Taken 01/22/2020 0800)  Moderate Risk (6-13):   MOD-Consider activation of bed alarm if appropriate   MOD-Apply bed exit alarm if patient is confused   MOD-Floor mat at bedside (where available) if appropriate   MOD-Consider a move closer to [place]   MOD-Remain with patient during toileting     Problem: Safety  Goal: Patient will be free from injury during hospitalization  Outcome: Progressing  Flowsheets (Taken 01/22/2020 1351)  Patient will be free from injury during hospitalization:   Provide and maintain safe environment   Assess patient's risk for falls and implement fall prevention plan of care per policy   Use appropriate transfer methods   Ensure appropriate safety devices are available at the bedside   Include patient/ family/ care giver in decisions related to safety   Hourly rounding  Goal: Patient will be free from infection during hospitalization  Outcome: Progressing  Flowsheets (Taken 01/22/2020 1351)  Free from Infection during hospitalization:   Assess and monitor for signs and symptoms of infection   Monitor all insertion sites (i.e. indwelling lines, tubes, urinary catheters, and drains)   Monitor lab/diagnostic results   Encourage patient and family to use good hand hygiene technique     Problem: Pain  Goal: Pain at adequate level as identified by patient  Outcome: Progressing  Flowsheets (Taken 01/22/2020 1351)  Pain at adequate level as identified by patient:   Identify patient comfort function goal   Assess for risk of opioid induced respiratory depression, including snoring/sleep apnea. Alert healthcare team of risk factors identified.   Assess pain on admission, during daily assessment and/or before any "as needed" intervention(s)   Evaluate patient's satisfaction with pain management progress   Evaluate if patient comfort function goal is met    Reassess pain within 30-60 minutes of any procedure/intervention, per Pain Assessment, Intervention, Reassessment (AIR) Cycle   Offer non-pharmacological pain management interventions     Problem: Side Effects from Pain Analgesia  Goal: Patient will experience minimal side effects of analgesic therapy  Outcome: Progressing  Flowsheets (Taken 01/22/2020 1351)  Patient will experience minimal side effects of analgesic therapy:   Monitor/assess patient's respiratory status (RR depth, effort, breath sounds)   Assess for changes in cognitive function     Problem: Renal Instability  Goal: Fluid and electrolyte balance are achieved/maintained  Outcome: Progressing  Flowsheets (Taken 01/22/2020 1351)  Fluid and electrolyte balance are achieved/maintained:   Monitor intake and output every shift   Monitor/assess lab values and report abnormal values   Monitor daily weight   Provide adequate hydration   Assess for confusion/personality changes   Observe for seizure activity and initiate seizure precautions if indicated   Observe for cardiac arrhythmias   Assess and reassess fluid and electrolyte status   Monitor for muscle weakness   Follow fluid restrictions/IV/PO parameters  Goal: Nutritional intake is adequate  Outcome: Progressing  Flowsheets (Taken 01/22/2020 1351)  Nutritional intake is adequate:   Encourage/perform oral hygiene as appropriate   Assist patient with meals/food selection   Monitor daily weights   Allow adequate time for meals   Encourage/administer dietary supplements as ordered (i.e. tube feed, TPN, oral, OGT/NGT, supplements)   Include patient/patient care companion in decisions related to nutrition   Assess anorexia, appetite, and amount of meal/food tolerated   Consult/collaborate with Speech Therapy (swallow evaluations)  Goal: Free from infection  Outcome:  Progressing  Flowsheets (Taken 01/22/2020 1351)  Free from infection:   Monitor/assess for signs and symptoms of  infection   Assess need for indwelling catheter every shift and discuss with LIP     Problem: Patient Receiving Advanced Renal Therapies  Goal: Therapy access site remains intact  Outcome: Progressing  Flowsheets (Taken 01/22/2020 1351)  Therapy access site remains intact: Assess therapy access site

## 2020-01-22 NOTE — Discharge Summary (Signed)
[facility]      Patient: [name]  Admission Date: 01/19/2020   DOB: [birth year]  Discharge Date: 01/22/2020    MRN: [id number]  Discharge Attending:[facility] Precious Reel, MD     Referring Physician: Pcp, None, MD  PCP: Pcp, None, MD       DISCHARGE SUMMARY     Discharge Information   Admission Diagnosis:   Hypertensive urgency    Discharge Diagnosis:   Active Hospital Problems    Diagnosis    Hypertensive urgency   Fluid overload  Anemia due to CKD  Deconditioned  Peripheral neuropathy  Diabetes mellitus  Morbid obesity  Right wrist pain  COPD by history  Hyperlipidemia  Hypertension  Sleep apnea  Hyperkalemia  Elevated troponin most likely due to underlying CKD  Recent COVID-19 infection  History of C. difficile colitis.      Admission Condition: Guarded  Discharge Condition: Stable and improved  Consultants: Dr. Debroah Baller and associates [facility]  Functional Status: Ambulatory  Discharged to: home     Discharge Medications:     Medication List      CHANGE how you take these medications    gabapentin 600 MG tablet  Commonly known as: NEURONTIN  Take 1 tablet (600 mg total) by mouth 3 (three) times daily For  Neuropathy  What changed: additional instructions        CONTINUE taking these medications    acetaminophen 325 MG tablet  Commonly known as: TYLENOL  Take 2 tablets (650 mg total) by mouth every 6 (six) hours as needed for Pain or Fever     amLODIPine 10 MG tablet  Commonly known as: NORVASC  Take 1 tablet (10 mg total) by mouth daily     atorvastatin 40 MG tablet  Commonly known as: LIPITOR     bisacodyl 5 MG EC tablet  Commonly known as: DULCOLAX  Take 1 tablet (5 mg total) by mouth daily as needed for Constipation     darbepoetin alfa 60 MCG/0.3ML injection  Commonly known as: ARANESP  Inject 0.3 mLs (60 mcg total) into the skin once a week     insulin glargine 100 UNIT/ML injection  Commonly known as: LANTUS  Inject 20 Units into the skin nightly     Lidocaine 2 %  Gel  Apply 1 application topically 4 (four) times daily To anal / buttock area     melatonin 3 mg tablet  Take 1 tablet (3 mg total) by mouth nightly as needed for Sleep     midodrine 10 MG tablet  Commonly known as: PROAMATINE  Take 1 tablet (10 mg total) by mouth 3 (three) times daily as needed (SBP < 110 or DBP < 70)     nystatin powder  Commonly known as: NYSTOP  Apply 1 application topically 2 (two) times daily     ondansetron 4 MG disintegrating tablet  Commonly known as: ZOFRAN-ODT  Take 1 tablet (4 mg total) by mouth every 6 (six) hours as needed for Nausea     pantoprazole 40 MG tablet  Commonly known as: PROTONIX  Take 1 tablet (40 mg total) by mouth 2 (two) times daily     sevelamer 800 MG tablet  Commonly known as: RENVELA     Trulicity 9.45 OP/9.2TW Sopn  Generic drug: Dulaglutide     vitamin C 500 MG tablet  Commonly known as: ASCORBIC ACID  Take 1 tablet (500 mg total) by mouth daily     zinc  Oxide 40 % Pste paste  Commonly known as: DESITIN  Apply topically as needed for Dry Skin        STOP taking these medications    cholestyramine 4 g packet  Commonly known as: QUESTRAN     lactobacillus/streptococcus Caps     methylPREDNISolone 4 MG tablet  Commonly known as: MEDROL DOSPACK     vancomycin 125 MG capsule  Commonly known as: VANCOCIN     zinc sulfate 220 (50 Zn) MG capsule  Commonly known as: ZINCATE           Where to Get Your Medications      These medications were sent to [facility], [address] [place], [place] [address]    Phone: [phone]    gabapentin 600 MG tablet             Hospital Course   Presentation History     Ms. Latavia Goga is a 64 year old African lady who is on hemodialysis and presented to the emergency room because of a chief complaint of shortness of breath and wrist pain  She had evidence of fluid overload on imaging studies.  And she was admitted for further management including urgent dialysis and  hemofiltration  See HPI for details.    Hospital Course (0 Days)   She was seen by nephrology group Dr. Elisabeth Cara and associates and underwent hemodialysis and supplemental oxygen.  With the above measures she improved and was stabilized and cleared for discharge for outpatient follow-up  Importance of follow-up with dialysis was expressed to the patient.  She had missed dialysis a couple of times which resulted in her admission to the hospital.         Best Practices   Was the patient admitted with either a CHF Exacerbation or Pneumonia?  Fluid overload     Progress Note/Physical Exam at Discharge     Subjective: Patient feels well and anxious to go home    Vitals:    01/21/20 2320 01/22/20 0303 01/22/20 0731 01/22/20 1225   BP: 125/76 113/64 119/69 115/72   Pulse: 93 88 82 89   Resp: 18 18 16 16    Temp: 97.5 F (36.4 C) 98.1 F (36.7 C) 97.5 F (36.4 C) 97.5 F (36.4 C)   TempSrc: Oral Oral Oral Oral   SpO2: 95% 95% 95% 95%   Weight:  98.6 kg (217 lb 6 oz)     Height:               General: NAD, AAOx3  HEENT: perrla, eomi, sclera anicteric, OP: Clear, MMM  Neck: supple, FROM, no LAD  Cardiovascular: RRR, no m/r/g  Lungs: CTAB, no w/r/r  Abdomen: soft, +BS, NT/ND, no masses, no g/r  Extremities: no C/C/E  Skin: no rashes or lesions noted  Neuro: CN 2-12 intact; No Focal neurological deficits       Diagnostics     Labs/Studies Pending at Discharge: None    Last Labs   Recent Labs   Lab 01/22/20  0305 01/21/20  0319 01/19/20  2202   WBC 4.02 4.25 8.22   RBC 2.68* 2.55* 3.21*   Hgb 7.8* 7.6* 9.3*   Hematocrit 26.8* 25.2* 31.6*   MCV 100.0* 98.8* 98.4*   Platelets 135* 115* 125*       Recent Labs   Lab 01/22/20  0305 01/21/20  0319 01/19/20  2202   Sodium  137 135* 135*   Potassium 4.5 4.2 5.4*   Chloride 99* 98* 104   CO2 26 26 17*   BUN 33.0* 33.0* 73.0*   Creatinine 5.4* 6.4* 10.4*   Glucose 270* 258* 329*   Calcium 8.1* 8.0* 8.2*   Magnesium  --  1.8  --        Microbiology Results (last 15 days)     Procedure  Component Value Units Date/Time    COVID-19 (SARS-COV-2) [employer] Rapid) [[id number]] Collected: 01/20/20 0013    Order Status: Completed Specimen: Nasopharyngeal Swab from Nasopharynx Updated: 01/20/20 0049     Purpose of COVID testing Screening     SARS-CoV-2 Specimen Source Nasopharyngeal     SARS CoV 2 Overall Result Negative     Comment: Test performed using the Abbott ID NOW EUA assay.  Please see Fact Sheets for patients and providers located at:  http://olson-hall.info/    This test is for the qualitative detection of SARS-CoV-2  (COVID19) nucleic acid. Viral nucleic acids may persist in vivo,  independent of viability. Detection of viral nucleic acid does  not imply the presence of infectious virus, or that virus  nucleic acid is the cause of clinical symptoms. Negative  results should be treated as presumptive and, if inconsistent  with clinical signs and symptoms or necessary for patient  management, should be tested with an alternative molecular  assay. Negative results do not preclude SARS-CoV-2 infection  and should not be used as the sole basis for patient  management decisions. Invalid results may be due to inhibiting  substances in the specimen and recollection should occur.         Narrative:      o Collect and clearly label specimen type:  o Upper respiratory specimen: One Nasopharyngeal Dry Swab NO  Transport Media.  o Hand deliver to laboratory ASAP  Indication for testing->Extended care facility admission to  semi private room  Screening           Patient Instructions   Discharge Diet: Consistent carbohydrate 2 g sodium low-cholesterol diet  Discharge Activity: As tolerated    Follow Up Appointment:   Follow-up Information     Pcp, None, MD .           Dialysis Follow up on 01/23/2020.                        Time spent examining patient, discussing with patient/family regarding hospital course, chart review, reconciling medications and discharge planning: >  35    minutes.    Signed,  Lisa Roca, MD    2:04 PM 01/22/2020

## 2020-01-22 NOTE — Progress Notes (Signed)
Patient discharge per Dr. Ulice Bold order.   Vital signs were stable and NSR on the telemetry monitor.   PIV line & telemetry box/wires was taken out.     discharge education handouts/packet given to the patient to take home.  Discharge education/instructions & home medications reviewed thoroughly.    Patient verbalized and demonstrated understanding as confirmed by teach back method and doesn't have any further questions for the treatment team.  Patient also expressed appreciation for the excellent service during this visit.      All personal belongings now in hand including AVS  transport is now at bedside to pick the patient up.    All questions/concerns addressed.

## 2020-10-20 ENCOUNTER — Emergency Department: Payer: 59

## 2020-10-20 ENCOUNTER — Observation Stay
Admission: EM | Admit: 2020-10-20 | Discharge: 2020-10-25 | DRG: 682 | Disposition: A | Discharge status: Home Health | Payer: 59 | Attending: Internal Medicine | Admitting: Internal Medicine

## 2020-10-20 DIAGNOSIS — E1122 Type 2 diabetes mellitus with diabetic chronic kidney disease: Secondary | ICD-10-CM | POA: Diagnosis present

## 2020-10-20 DIAGNOSIS — E1151 Type 2 diabetes mellitus with diabetic peripheral angiopathy without gangrene: Secondary | ICD-10-CM | POA: Insufficient documentation

## 2020-10-20 DIAGNOSIS — R4182 Altered mental status, unspecified: Secondary | ICD-10-CM | POA: Insufficient documentation

## 2020-10-20 DIAGNOSIS — Z992 Dependence on renal dialysis: Secondary | ICD-10-CM

## 2020-10-20 DIAGNOSIS — N186 End stage renal disease: Secondary | ICD-10-CM | POA: Diagnosis present

## 2020-10-20 DIAGNOSIS — I248 Other forms of acute ischemic heart disease: Secondary | ICD-10-CM | POA: Diagnosis present

## 2020-10-20 DIAGNOSIS — Z20822 Contact with and (suspected) exposure to covid-19: Secondary | ICD-10-CM | POA: Insufficient documentation

## 2020-10-20 DIAGNOSIS — R160 Hepatomegaly, not elsewhere classified: Secondary | ICD-10-CM | POA: Insufficient documentation

## 2020-10-20 DIAGNOSIS — J449 Chronic obstructive pulmonary disease, unspecified: Secondary | ICD-10-CM | POA: Diagnosis present

## 2020-10-20 DIAGNOSIS — R778 Other specified abnormalities of plasma proteins: Secondary | ICD-10-CM | POA: Insufficient documentation

## 2020-10-20 DIAGNOSIS — I161 Hypertensive emergency: Secondary | ICD-10-CM | POA: Diagnosis present

## 2020-10-20 DIAGNOSIS — R531 Weakness: Secondary | ICD-10-CM | POA: Insufficient documentation

## 2020-10-20 DIAGNOSIS — E1165 Type 2 diabetes mellitus with hyperglycemia: Secondary | ICD-10-CM | POA: Diagnosis present

## 2020-10-20 DIAGNOSIS — Z9115 Patient's noncompliance with renal dialysis: Secondary | ICD-10-CM

## 2020-10-20 DIAGNOSIS — E872 Acidosis, unspecified: Secondary | ICD-10-CM | POA: Diagnosis present

## 2020-10-20 DIAGNOSIS — Z6841 Body Mass Index (BMI) 40.0 and over, adult: Secondary | ICD-10-CM

## 2020-10-20 DIAGNOSIS — Z91199 Patient's noncompliance with other medical treatment and regimen due to unspecified reason: Secondary | ICD-10-CM | POA: Insufficient documentation

## 2020-10-20 DIAGNOSIS — Z79899 Other long term (current) drug therapy: Secondary | ICD-10-CM | POA: Insufficient documentation

## 2020-10-20 DIAGNOSIS — R0902 Hypoxemia: Secondary | ICD-10-CM

## 2020-10-20 DIAGNOSIS — D696 Thrombocytopenia, unspecified: Secondary | ICD-10-CM | POA: Insufficient documentation

## 2020-10-20 DIAGNOSIS — Z7982 Long term (current) use of aspirin: Secondary | ICD-10-CM | POA: Insufficient documentation

## 2020-10-20 DIAGNOSIS — W1830XA Fall on same level, unspecified, initial encounter: Secondary | ICD-10-CM | POA: Diagnosis present

## 2020-10-20 DIAGNOSIS — R079 Chest pain, unspecified: Secondary | ICD-10-CM

## 2020-10-20 DIAGNOSIS — E1142 Type 2 diabetes mellitus with diabetic polyneuropathy: Secondary | ICD-10-CM | POA: Diagnosis present

## 2020-10-20 DIAGNOSIS — G4733 Obstructive sleep apnea (adult) (pediatric): Secondary | ICD-10-CM | POA: Diagnosis present

## 2020-10-20 DIAGNOSIS — M25551 Pain in right hip: Secondary | ICD-10-CM | POA: Diagnosis present

## 2020-10-20 DIAGNOSIS — J811 Chronic pulmonary edema: Secondary | ICD-10-CM | POA: Insufficient documentation

## 2020-10-20 DIAGNOSIS — Z8616 Personal history of COVID-19: Secondary | ICD-10-CM

## 2020-10-20 DIAGNOSIS — M25512 Pain in left shoulder: Secondary | ICD-10-CM | POA: Insufficient documentation

## 2020-10-20 DIAGNOSIS — Z9981 Dependence on supplemental oxygen: Secondary | ICD-10-CM | POA: Insufficient documentation

## 2020-10-20 DIAGNOSIS — Z794 Long term (current) use of insulin: Secondary | ICD-10-CM | POA: Insufficient documentation

## 2020-10-20 DIAGNOSIS — I120 Hypertensive chronic kidney disease with stage 5 chronic kidney disease or end stage renal disease: Secondary | ICD-10-CM | POA: Insufficient documentation

## 2020-10-20 DIAGNOSIS — M25552 Pain in left hip: Secondary | ICD-10-CM | POA: Diagnosis present

## 2020-10-20 DIAGNOSIS — E785 Hyperlipidemia, unspecified: Secondary | ICD-10-CM | POA: Diagnosis present

## 2020-10-20 DIAGNOSIS — M25532 Pain in left wrist: Secondary | ICD-10-CM | POA: Diagnosis present

## 2020-10-20 DIAGNOSIS — R0602 Shortness of breath: Principal | ICD-10-CM | POA: Insufficient documentation

## 2020-10-20 DIAGNOSIS — D693 Immune thrombocytopenic purpura: Secondary | ICD-10-CM | POA: Diagnosis present

## 2020-10-20 DIAGNOSIS — Z8619 Personal history of other infectious and parasitic diseases: Secondary | ICD-10-CM

## 2020-10-20 DIAGNOSIS — D631 Anemia in chronic kidney disease: Secondary | ICD-10-CM | POA: Diagnosis present

## 2020-10-20 DIAGNOSIS — E8770 Fluid overload, unspecified: Secondary | ICD-10-CM | POA: Diagnosis present

## 2020-10-20 DIAGNOSIS — M25531 Pain in right wrist: Secondary | ICD-10-CM | POA: Insufficient documentation

## 2020-10-20 DIAGNOSIS — R600 Localized edema: Secondary | ICD-10-CM | POA: Insufficient documentation

## 2020-10-20 DIAGNOSIS — E875 Hyperkalemia: Secondary | ICD-10-CM | POA: Diagnosis present

## 2020-10-20 DIAGNOSIS — Y92009 Unspecified place in unspecified non-institutional (private) residence as the place of occurrence of the external cause: Secondary | ICD-10-CM

## 2020-10-20 DIAGNOSIS — R748 Abnormal levels of other serum enzymes: Secondary | ICD-10-CM | POA: Insufficient documentation

## 2020-10-20 DIAGNOSIS — R200 Anesthesia of skin: Secondary | ICD-10-CM | POA: Insufficient documentation

## 2020-10-20 DIAGNOSIS — M85832 Other specified disorders of bone density and structure, left forearm: Secondary | ICD-10-CM | POA: Insufficient documentation

## 2020-10-20 DIAGNOSIS — W19XXXA Unspecified fall, initial encounter: Secondary | ICD-10-CM | POA: Insufficient documentation

## 2020-10-20 DIAGNOSIS — M5031 Other cervical disc degeneration,  high cervical region: Secondary | ICD-10-CM | POA: Insufficient documentation

## 2020-10-20 LAB — BLOOD GAS, VENOUS
Base Excess, Ven: -2.8 mEq/L
HCO3, Ven: 23.5 mEq/L
O2 Sat, Venous: 88.2 %
Temperature: 37
Venous Total CO2: 50.2 mEq/L
pCO2, Venous: 50.8 mmhg
pH, Ven: 7.287
pO2, Venous: 59.4 mmhg

## 2020-10-20 LAB — COMPREHENSIVE METABOLIC PANEL
ALT: 41 U/L (ref 0–55)
AST (SGOT): 22 U/L (ref 5–41)
Albumin/Globulin Ratio: 0.9 (ref 0.9–2.2)
Albumin: 2.7 g/dL — ABNORMAL LOW (ref 3.5–5.0)
Alkaline Phosphatase: 186 U/L — ABNORMAL HIGH (ref 37–117)
Anion Gap: 13 (ref 5.0–15.0)
BUN: 64 mg/dL — ABNORMAL HIGH (ref 7.0–21.0)
Bilirubin, Total: 0.5 mg/dL (ref 0.2–1.2)
CO2: 22 mEq/L (ref 17–29)
Calcium: 7.9 mg/dL — ABNORMAL LOW (ref 8.5–10.5)
Chloride: 100 mEq/L (ref 99–111)
Creatinine: 8 mg/dL — ABNORMAL HIGH (ref 0.4–1.0)
Globulin: 3.1 g/dL (ref 2.0–3.6)
Glucose: 476 mg/dL — ABNORMAL HIGH (ref 70–100)
Potassium: 4.7 mEq/L (ref 3.5–5.3)
Protein, Total: 5.8 g/dL — ABNORMAL LOW (ref 6.0–8.3)
Sodium: 135 mEq/L (ref 135–145)

## 2020-10-20 LAB — CBC AND DIFFERENTIAL
Absolute NRBC: 0 10*3/uL (ref 0.00–0.00)
Basophils Absolute Automated: 0.03 10*3/uL (ref 0.00–0.08)
Basophils Automated: 0.4 %
Eosinophils Absolute Automated: 0.04 10*3/uL (ref 0.00–0.44)
Eosinophils Automated: 0.5 %
Hematocrit: 31.2 % — ABNORMAL LOW (ref 34.7–43.7)
Hgb: 9.8 g/dL — ABNORMAL LOW (ref 11.4–14.8)
Immature Granulocytes Absolute: 0.05 10*3/uL (ref 0.00–0.07)
Immature Granulocytes: 0.7 %
Lymphocytes Absolute Automated: 1.03 10*3/uL (ref 0.42–3.22)
Lymphocytes Automated: 13.4 %
MCH: 30 pg (ref 25.1–33.5)
MCHC: 31.4 g/dL — ABNORMAL LOW (ref 31.5–35.8)
MCV: 95.4 fL (ref 78.0–96.0)
MPV: 13.3 fL — ABNORMAL HIGH (ref 8.9–12.5)
Monocytes Absolute Automated: 0.87 10*3/uL — ABNORMAL HIGH (ref 0.21–0.85)
Monocytes: 11.3 %
Neutrophils Absolute: 5.66 10*3/uL (ref 1.10–6.33)
Neutrophils: 73.7 %
Nucleated RBC: 0 /100 WBC (ref 0.0–0.0)
Platelets: 65 10*3/uL — ABNORMAL LOW (ref 142–346)
RBC: 3.27 10*6/uL — ABNORMAL LOW (ref 3.90–5.10)
RDW: 13 % (ref 11–15)
WBC: 7.68 10*3/uL (ref 3.10–9.50)

## 2020-10-20 LAB — COVID-19 (SARS-COV-2): SARS CoV 2 Overall Result: NOT DETECTED

## 2020-10-20 LAB — GLUCOSE WHOLE BLOOD - POCT
Whole Blood Glucose POCT: 420 mg/dL — ABNORMAL HIGH (ref 70–100)
Whole Blood Glucose POCT: 324 mg/dL — ABNORMAL HIGH (ref 70–100)

## 2020-10-20 LAB — GFR: EGFR: 6.1

## 2020-10-20 LAB — HEPATITIS B SURFACE ANTIGEN W/ REFLEX TO CONFIRMATION: Hepatitis B Surface Antigen: NONREACTIVE

## 2020-10-20 LAB — TROPONIN I: Troponin I: 0.42 ng/mL (ref 0.00–0.05)

## 2020-10-20 LAB — PHOSPHORUS: Phosphorus: 5.4 mg/dL — ABNORMAL HIGH (ref 2.3–4.7)

## 2020-10-20 LAB — B-TYPE NATRIURETIC PEPTIDE: B-Natriuretic Peptide: 2878 pg/mL — ABNORMAL HIGH (ref 0–100)

## 2020-10-20 LAB — MAGNESIUM: Magnesium: 2 mg/dL (ref 1.6–2.6)

## 2020-10-20 MED ORDER — INSULIN LISPRO 100 UNIT/ML SOLN (WRAP)
1.0000 [IU] | Freq: Three times a day (TID) | Status: DC
Start: 2020-10-21 — End: 2020-10-21

## 2020-10-20 MED ORDER — SODIUM CHLORIDE 0.9 % IV BOLUS
100.0000 mL | INTRAVENOUS | Status: AC | PRN
Start: 2020-10-20 — End: 2020-10-20

## 2020-10-20 MED ORDER — GABAPENTIN 300 MG PO CAPS
600.0000 mg | ORAL_CAPSULE | Freq: Every day | ORAL | Status: DC
Start: 2020-10-21 — End: 2020-10-25
  Administered 2020-10-21 – 2020-10-25 (×5): 600 mg via ORAL
  Filled 2020-10-20 (×5): qty 2

## 2020-10-20 MED ORDER — POTASSIUM CHLORIDE 10 MEQ/100ML IV SOLN
10.0000 meq | INTRAVENOUS | Status: DC | PRN
Start: 2020-10-20 — End: 2020-10-21

## 2020-10-20 MED ORDER — SEVELAMER CARBONATE 800 MG PO TABS
800.0000 mg | ORAL_TABLET | Freq: Three times a day (TID) | ORAL | Status: DC
Start: 2020-10-21 — End: 2020-10-25
  Administered 2020-10-21 – 2020-10-25 (×13): 800 mg via ORAL
  Filled 2020-10-20 (×13): qty 1

## 2020-10-20 MED ORDER — CARVEDILOL 25 MG PO TABS
25.0000 mg | ORAL_TABLET | Freq: Two times a day (BID) | ORAL | Status: DC
Start: 2020-10-20 — End: 2020-10-25
  Administered 2020-10-20 – 2020-10-24 (×8): 25 mg via ORAL
  Filled 2020-10-20 (×9): qty 1

## 2020-10-20 MED ORDER — FUROSEMIDE 10 MG/ML IJ SOLN
100.0000 mg | Freq: Once | INTRAMUSCULAR | Status: AC
Start: 2020-10-20 — End: 2020-10-20
  Administered 2020-10-20: 13:00:00 100 mg via INTRAVENOUS
  Filled 2020-10-20: qty 10

## 2020-10-20 MED ORDER — SODIUM CHLORIDE 0.9 % IV BOLUS
250.0000 mL | INTRAVENOUS | Status: AC | PRN
Start: 2020-10-20 — End: 2020-10-20

## 2020-10-20 MED ORDER — NALOXONE HCL 0.4 MG/ML IJ SOLN (WRAP)
0.2000 mg | INTRAMUSCULAR | Status: DC | PRN
Start: 2020-10-20 — End: 2020-10-25

## 2020-10-20 MED ORDER — CARVEDILOL 25 MG PO TABS
25.0000 mg | ORAL_TABLET | Freq: Every day | ORAL | Status: DC
Start: 2020-10-21 — End: 2020-10-20

## 2020-10-20 MED ORDER — POTASSIUM CHLORIDE CRYS ER 20 MEQ PO TBCR
0.0000 meq | EXTENDED_RELEASE_TABLET | ORAL | Status: DC | PRN
Start: 2020-10-20 — End: 2020-10-21

## 2020-10-20 MED ORDER — MELATONIN 3 MG PO TABS
3.0000 mg | ORAL_TABLET | Freq: Every evening | ORAL | Status: DC | PRN
Start: 2020-10-20 — End: 2020-10-25
  Administered 2020-10-23: 22:00:00 3 mg via ORAL
  Filled 2020-10-20: qty 1

## 2020-10-20 MED ORDER — MORPHINE SULFATE 4 MG/ML IJ/IV SOLN (WRAP)
4.0000 mg | Freq: Once | Status: AC
Start: 2020-10-20 — End: 2020-10-20
  Administered 2020-10-20: 12:00:00 4 mg via INTRAVENOUS
  Filled 2020-10-20: qty 1

## 2020-10-20 MED ORDER — HEPARIN SODIUM (PORCINE) 5000 UNIT/ML IJ SOLN
5000.0000 [IU] | Freq: Two times a day (BID) | INTRAMUSCULAR | Status: DC
Start: 2020-10-20 — End: 2020-10-20

## 2020-10-20 MED ORDER — MORPHINE SULFATE 2 MG/ML IJ/IV SOLN (WRAP)
2.0000 mg | Freq: Once | Status: AC
Start: 2020-10-21 — End: 2020-10-20
  Administered 2020-10-20: 2 mg via INTRAVENOUS
  Filled 2020-10-20: qty 1

## 2020-10-20 MED ORDER — ASCORBIC ACID 500 MG PO TABS
500.0000 mg | ORAL_TABLET | Freq: Every day | ORAL | Status: DC
Start: 2020-10-21 — End: 2020-10-25
  Administered 2020-10-21 – 2020-10-25 (×5): 500 mg via ORAL
  Filled 2020-10-20 (×5): qty 1

## 2020-10-20 MED ORDER — ASPIRIN 81 MG PO CHEW
81.0000 mg | CHEWABLE_TABLET | Freq: Every day | ORAL | Status: DC
Start: 2020-10-21 — End: 2020-10-25
  Administered 2020-10-21 – 2020-10-25 (×5): 81 mg via ORAL
  Filled 2020-10-20 (×5): qty 1

## 2020-10-20 MED ORDER — ACETAMINOPHEN 325 MG PO TABS
650.0000 mg | ORAL_TABLET | Freq: Four times a day (QID) | ORAL | Status: DC | PRN
Start: 2020-10-20 — End: 2020-10-21
  Administered 2020-10-21: 650 mg via ORAL
  Filled 2020-10-20: qty 2

## 2020-10-20 MED ORDER — HYDROCODONE-ACETAMINOPHEN 5-325 MG PO TABS
1.0000 | ORAL_TABLET | Freq: Four times a day (QID) | ORAL | Status: DC | PRN
Start: 2020-10-20 — End: 2020-10-21
  Administered 2020-10-21 (×2): 1 via ORAL
  Filled 2020-10-20 (×2): qty 1

## 2020-10-20 MED ORDER — DEXTROSE 50 % IV SOLN
25.0000 g | INTRAVENOUS | Status: DC | PRN
Start: 2020-10-20 — End: 2020-10-21

## 2020-10-20 MED ORDER — GLUCAGON 1 MG IJ SOLR (WRAP)
1.0000 mg | INTRAMUSCULAR | Status: DC | PRN
Start: 2020-10-20 — End: 2020-10-21

## 2020-10-20 MED ORDER — DEXTROSE 5% IV BOLUS
250.0000 mL | INTRAVENOUS | Status: DC | PRN
Start: 2020-10-20 — End: 2020-10-21

## 2020-10-20 MED ORDER — DEXTROSE 10 % IV BOLUS
25.0000 g | INTRAVENOUS | Status: DC | PRN
Start: 2020-10-20 — End: 2020-10-21

## 2020-10-20 MED ORDER — HYDRALAZINE HCL 20 MG/ML IJ SOLN
10.0000 mg | Freq: Four times a day (QID) | INTRAMUSCULAR | Status: DC | PRN
Start: 2020-10-20 — End: 2020-10-25

## 2020-10-20 MED ORDER — AMLODIPINE BESYLATE 5 MG PO TABS
10.0000 mg | ORAL_TABLET | Freq: Every day | ORAL | Status: DC
Start: 2020-10-21 — End: 2020-10-25
  Administered 2020-10-21 – 2020-10-24 (×3): 10 mg via ORAL
  Filled 2020-10-20 (×4): qty 2

## 2020-10-20 NOTE — ED Notes (Signed)
At dialysis at this time.

## 2020-10-20 NOTE — ED Triage Notes (Signed)
Pt BIBA after mechanical fall yesterday and now c/o left hip and wrist pain. Pt states she tried to get out of bed and the stool tipped over and admits to hitting head on dresser but denies LOC. Pt coming from dialysis today but it was not started due to hip pain. Pt stated she has not been dialyzed since Friday. Pt denies cp, or dizziness but admits to increased b/l edema and sob. Per ems dexi read "high".

## 2020-10-20 NOTE — ED Notes (Signed)
3.3L of fluids taken out per dialysis nurse.

## 2020-10-20 NOTE — Plan of Care (Signed)
Problem: Renal Instability  Goal: Fluid and electrolyte balance are achieved/maintained  Flowsheets (Taken 10/20/2020 1549)  Fluid and electrolyte balance are achieved/maintained:   Monitor intake and output every shift   Monitor/assess lab values and report abnormal values   Monitor daily weight   Assess for confusion/personality changes   Assess and reassess fluid and electrolyte status   Observe for cardiac arrhythmias   Follow fluid restrictions/IV/PO parameters     Problem: Patient Receiving Advanced Renal Therapies  Goal: Therapy access site remains intact  Flowsheets (Taken 10/20/2020 1549)  Therapy access site remains intact: Assess therapy access site

## 2020-10-20 NOTE — ED Notes (Addendum)
[facility] EMERGENCY DEPARTMENT  ED NURSING NOTE FOR THE RECEIVING INPATIENT NURSE   ED NURSE Midge Minium Latif BSN RN    [facility] [phone]   ED CHARGE RN [phone]   ADMISSION INFORMATION   EVALINA CREED is a 65 y.o. female admitted with a diagnosis of:    1. Shortness of breath         Isolation: None   Allergies: Lisinopril and Metformin   Holding Orders confirmed? Yes   Belongings Documented? Yes   Home medications sent to pharmacy confirmed? No   NURSING CARE   Patient Comes From:   Mental Status: Assist x 1   alert and oriented   ADL: Assist x 1   Ambulation: moderate  difficulty   Pertinent Information  and Safety Concerns: fall risk     COVID Test sent to lab? Yes   VITAL SIGNS   Time BP Temp Pulse Resp SpO2   1455 151/68 99.49f81 21 96   CT / NIH   CT Head ordered on this patient?  No   NIH/Dysphagia assessment done prior to admission? No   PERSONAL PROTECTIVE EQUIPMENT   Gloves   LAB RESULTS   Labs Reviewed   CBC AND DIFFERENTIAL - Abnormal; Notable for the following components:       Result Value    Hgb 9.8 (*)     Hematocrit 31.2 (*)     Platelets 65 (*)     RBC 3.27 (*)     MCHC 31.4 (*)     MPV 13.3 (*)     Monocytes Absolute Automated 0.87 (*)     All other components within normal limits   B-TYPE NATRIURETIC PEPTIDE - Abnormal; Notable for the following components:    B-Natriuretic Peptide 2,878 (*)     All other components within normal limits   COMPREHENSIVE METABOLIC PANEL - Abnormal; Notable for the following components:    Glucose 476 (*)     BUN 64.0 (*)     Creatinine 8.0 (*)     Calcium 7.9 (*)     Protein, Total 5.8 (*)     Albumin 2.7 (*)     Alkaline Phosphatase 186 (*)     All other components within normal limits   PHOSPHORUS - Abnormal; Notable for the following components:    Phosphorus 5.4 (*)     All other components within normal limits   TROPONIN I - Abnormal; Notable for the following components:    Troponin I 0.42 (*)     All other components within normal limits   GLUCOSE WHOLE  BLOOD - POCT - Abnormal; Notable for the following components:    Whole Blood Glucose POCT 420 (*)     All other components within normal limits   COVID-19 (SARS-COV-2)    Narrative:     o Collect and clearly label specimen type:  o PREFERRED-Upper respiratory specimen: One Nasal Swab in  [employer]  o Hand deliver to laboratory ASAP  Indication for testing->Extended care facility admission to  semi private room  Screening   MAGNESIUM   GFR   BLOOD GAS, VENOUS   HEPATITIS B SURFACE ANTIGEN W/ REFLEX TO CONFIRMATION          Ticket to Ride Printed: Yes

## 2020-10-20 NOTE — H&P (Signed)
[facility]      Patient: [name]  Date: 10/20/2020   DOB: [birth year]  Admission Date: 10/20/2020   MRN: [id number]  Attending: Charmaine Downs MD       Chief Complaint   Patient presents with    Fall    Hip Pain    Wrist Pain      History Gathered From: Patient    HISTORY AND PHYSICAL     [name] is a 65 y.o. 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  Patient reports that she fell yesterday while trying to get out of bed.  She struck the left side of her body and her head but did not lose consciousness.  She was able to get herself up and walk despite experiencing constant sharp left hip pain shoulder pain.  She also noticed puffiness of her face and increased shortness of breath since this morning.  Missed her HD session last Monday.  She has not been wearing her CPAP in a long time.  She denies chest pain, palpitation, orthopnea, PND.  At the ED her BP was elevated at 200/83.  Review of blood work was significant for H/H of 9.8/31.2, platelets 65, troponin 0.42, BNP 2878, fingersticks 476.  EKG shows NSR with PAC and mild ST depression in V5 and V6    Past Medical History:   Diagnosis Date    Chronic obstructive pulmonary disease     Diabetes mellitus     Hyperlipidemia     Hypertension     Sleep apnea 2018       Past Surgical History:   Procedure Laterality Date    EGD, BIOPSY N/A 05/14/2019    Procedure: EGD, BIOPSY;  Surgeon: Ronie Spies, MD;  Location: [facility];  Service: Gastroenterology;  Laterality: N/A;    EXPLORATORY LAPAROTOMY N/A 05/27/2019    Procedure: EXPLORATORY LAPAROTOMY;  Surgeon: Herbert Moors., MD;  Location: [facility];  Service: General;  Laterality: N/A;    JOINT REPLACEMENT      shoulder    JOINT REPLACEMENT      knee    OVARY SURGERY Left     Removal    REMOVAL, FOREIGN BODY N/A 05/27/2019    Procedure:  REMOVAL, PERITONEAL DIALYSIS CATHETER;  Surgeon: Herbert Moors., MD;  Location: [facility];  Service: General;  Laterality: N/A;    TUNNELED CATH CHECK/CHANGE (PERMCATH) N/A 07/03/2019    Procedure: TUNNELED CATH CHECK/CHANGE;  Surgeon: Maureen Ralphs, MD;  Location: [facility];  Service: Interventional Radiology;  Laterality: N/A;    TUNNELED CATH PLACEMENT (PERMCATH) Bilateral 05/23/2019    Procedure: TUNNELED CATH PLACEMENT;  Surgeon: Jacques Earthly, MD;  Location: [facility];  Service: Interventional Radiology;  Laterality: Bilateral;    TUNNELED CATH PLACEMENT (PERMCATH) Right 07/31/2019    Procedure: TUNNELED CATH PLACEMENT;  Surgeon: Jacques Earthly, MD;  Location: [facility];  Service: Interventional Radiology;  Laterality: Right;       Prior to Admission medications    Medication Sig Start Date End Date Taking? Authorizing Provider   amLODIPine (NORVASC) 10 MG tablet Take 1 tablet (10 mg total) by mouth daily 01/07/20  Yes Winfred, Carolan Clines, MD   aspirin 81 MG chewable tablet Chew 81 mg by mouth daily   Yes [provider]   carvedilol (COREG) 25 MG tablet  Take 1 tablet by mouth daily 07/22/20  Yes [provider]   gabapentin (NEURONTIN) 600 MG tablet Take 1 tablet by mouth daily 10/21/19  Yes [provider]   sevelamer (RENVELA) 800 MG tablet Take 800 mg by mouth 3 (three) times daily with meals   Yes [provider]   vitamin C (ASCORBIC ACID) 500 MG tablet Take 1 tablet (500 mg total) by mouth daily 06/30/19  Yes Tirrell, Katharine Look, MD   atorvastatin (LIPITOR) 40 MG tablet Take 40 mg by mouth daily    [provider]   acetaminophen (TYLENOL) 325 MG tablet Take 2 tablets (650 mg total) by mouth every 6 (six) hours as needed for Pain or Fever 07/28/19 10/20/20  Lisa Roca, MD   bisacodyl (DULCOLAX) 5 MG EC tablet Take 1 tablet (5 mg total) by mouth daily as needed for Constipation 07/28/19 10/20/20  Lisa Roca, MD   darbepoetin alfa (ARANESP)  60 MCG/0.3ML injection Inject 0.3 mLs (60 mcg total) into the skin once a week 07/30/19 10/20/20  Lisa Roca, MD   Dulaglutide (Trulicity) [id number] Solution Pen-injector Inject 0.75 mg into the skin once a week  10/20/20  [provider]   gabapentin (NEURONTIN) 600 MG tablet Take 1 tablet (600 mg total) by mouth 3 (three) times daily For  Neuropathy 01/22/20 10/20/20  Lisa Roca, MD   insulin glargine (LANTUS) 100 UNIT/ML injection Inject 20 Units into the skin nightly 01/06/20 10/20/20  Winfred, Carolan Clines, MD   Lidocaine 2 % Gel Apply 1 application topically 4 (four) times daily To anal / buttock area 01/06/20 10/20/20  Winfred, Carolan Clines, MD   melatonin 3 mg tablet Take 1 tablet (3 mg total) by mouth nightly as needed for Sleep 07/28/19 10/20/20  Lisa Roca, MD   midodrine (PROAMATINE) 10 MG tablet Take 1 tablet (10 mg total) by mouth 3 (three) times daily as needed (SBP < 110 or DBP < 70) 01/06/20 10/20/20  Winfred, Carolan Clines, MD   nystatin (NYSTOP) powder Apply 1 application topically 2 (two) times daily 07/14/19 10/20/20  Rogue Jury, MD   ondansetron (ZOFRAN-ODT) 4 MG disintegrating tablet Take 1 tablet (4 mg total) by mouth every 6 (six) hours as needed for Nausea 07/28/19 10/20/20  Lisa Roca, MD   pantoprazole (PROTONIX) 40 MG tablet Take 1 tablet (40 mg total) by mouth 2 (two) times daily 05/20/19 10/20/20  Ardeth Sportsman, MD   zinc Oxide (DESITIN) 40 % Paste paste Apply topically as needed for Dry Skin 01/06/20 10/20/20  Winfred, Carolan Clines, MD       Allergies   Allergen Reactions    Lisinopril Hives    Metformin Hives       CODE STATUS: Full    PRIMARY CARE MD: Pcp, None, MD    Family History   Problem Relation Age of Onset    Diabetes Mother     Hypertension Mother     Diabetes Father     Hypertension Father     Diabetes Sister     Hypertension Sister     Diabetes Brother     Hypertension Brother     Diabetes Paternal Aunt     Diabetes Paternal Uncle        Social History      Tobacco Use    Smoking status: Never    Smokeless tobacco: Never   Vaping Use    Vaping Use: Never used  Substance Use Topics    Alcohol use: Never    Drug use: Never       REVIEW OF SYSTEMS   12 point review of systems was done and found to be negative except the ones mentioned in the HPI  PHYSICAL EXAM     Vital Signs (most recent): BP 152/70   Pulse 84   Temp 98.6 F (37 C) (Oral)   Resp 20   Ht 1.575 m ('5\' 2"'$ )   Wt 100 kg (220 lb 7.4 oz)   LMP  (LMP Unknown) Comment: post menopausal  SpO2 99%   BMI 40.32 kg/m   Constitutional: No apparent distress.  Patient speaks freely in full sentences.   HEENT: NC/AT, PERRL, no scleral icterus or conjunctival pallor, no nasal discharge, MMM, oropharynx without erythema or exudate  Neck: trachea midline, supple, no cervical or supraclavicular lymphadenopathy or masses  Cardiovascular: RRR, normal S1 S2, no murmurs, gallops, palpable thrills, no JVD, Non-displaced PMI.  Respiratory: Normal rate. No retractions or increased work of breathing. Clear to auscultation and percussion bilaterally.  Gastrointestinal: +BS, non-distended, soft, non-tender, no rebound or guarding, no hepatosplenomegaly  Genitourinary: no suprapubic or costovertebral angle tenderness  Musculoskeletal: ROM and motor strength grossly normal. No clubbing, ++ pretibial and ankle edema, no cyanosis. DP and radial pulses 2+ and symmetric.  Skin: no rashes, jaundice or other lesions  Neurologic: EOMI, CN 2-12 grossly intact. no gross motor or sensory deficits  Psychiatric: AAOx3, affect and mood appropriate. The patient is alert, interactive, appropriate.    LABS & IMAGING     Recent Results (from the past 24 hour(s))   Glucose Whole Blood - POCT    Collection Time: 10/20/20 11:00 AM   Result Value Ref Range    Whole Blood Glucose POCT 420 (H) 70 - 100 mg/dL   CBC and differential    Collection Time: 10/20/20 11:57 AM   Result Value Ref Range    WBC 7.68 3.10 - 9.50 x10 3/uL    Hgb 9.8 (L) 11.4 -  14.8 g/dL    Hematocrit 31.2 (L) 34.7 - 43.7 %    Platelets 65 (L) 142 - 346 x10 3/uL    RBC 3.27 (L) 3.90 - 5.10 x10 6/uL    MCV 95.4 78.0 - 96.0 fL    MCH 30.0 25.1 - 33.5 pg    MCHC 31.4 (L) 31.5 - 35.8 g/dL    RDW 13 11 - 15 %    MPV 13.3 (H) 8.9 - 12.5 fL    Neutrophils 73.7 None %    Lymphocytes Automated 13.4 None %    Monocytes 11.3 None %    Eosinophils Automated 0.5 None %    Basophils Automated 0.4 None %    Immature Granulocytes 0.7 None %    Nucleated RBC 0.0 0.0 - 0.0 /100 WBC    Neutrophils Absolute 5.66 1.10 - 6.33 x10 3/uL    Lymphocytes Absolute Automated 1.03 0.42 - 3.22 x10 3/uL    Monocytes Absolute Automated 0.87 (H) 0.21 - 0.85 x10 3/uL    Eosinophils Absolute Automated 0.04 0.00 - 0.44 x10 3/uL    Basophils Absolute Automated 0.03 0.00 - 0.08 x10 3/uL    Immature Granulocytes Absolute 0.05 0.00 - 0.07 x10 3/uL    Absolute NRBC 0.00 0.00 - 0.00 x10 3/uL   B-type Natriuretic Peptide    Collection Time: 10/20/20 11:57 AM   Result Value Ref Range    B-Natriuretic Peptide 2,878 (H) 0 -  100 pg/mL   Comprehensive metabolic panel    Collection Time: 10/20/20 11:57 AM   Result Value Ref Range    Glucose 476 (H) 70 - 100 mg/dL    BUN 64.0 (H) 7.0 - 21.0 mg/dL    Creatinine 8.0 (H) 0.4 - 1.0 mg/dL    Sodium 135 135 - 145 mEq/L    Potassium 4.7 3.5 - 5.3 mEq/L    Chloride 100 99 - 111 mEq/L    CO2 22 17 - 29 mEq/L    Calcium 7.9 (L) 8.5 - 10.5 mg/dL    Protein, Total 5.8 (L) 6.0 - 8.3 g/dL    Albumin 2.7 (L) 3.5 - 5.0 g/dL    AST (SGOT) 22 5 - 41 U/L    ALT 41 0 - 55 U/L    Alkaline Phosphatase 186 (H) 37 - 117 U/L    Bilirubin, Total 0.5 0.2 - 1.2 mg/dL    Globulin 3.1 2.0 - 3.6 g/dL    Albumin/Globulin Ratio 0.9 0.9 - 2.2    Anion Gap 13.0 5.0 - 15.0   Phosphorus    Collection Time: 10/20/20 11:57 AM   Result Value Ref Range    Phosphorus 5.4 (H) 2.3 - 4.7 mg/dL   Magnesium    Collection Time: 10/20/20 11:57 AM   Result Value Ref Range    Magnesium 2.0 1.6 - 2.6 mg/dL   Troponin I    Collection Time:  10/20/20 11:57 AM   Result Value Ref Range    Troponin I 0.42 (HH) 0.00 - 0.05 ng/mL   GFR    Collection Time: 10/20/20 11:57 AM   Result Value Ref Range    EGFR 6.1    Venous Blood Gas    Collection Time: 10/20/20 11:57 AM   Result Value Ref Range    pH, Ven 7.287 None Estab.    pCO2, Venous 50.8 None Estab. mmhg    pO2, Venous 59.4 None Estab. mmhg    HCO3, Ven 23.5 None Estab. mEq/L    Venous Total CO2 50.2 None Estab. mEq/L    Base Excess, Ven -2.8 None Estab. mEq/L    O2 Sat, Venous 88.2 None Estab. %    Temperature 37.0     VBG CollectionSite right AC    Hepatitis B (HBV) Surface Antigen w/ Reflex to Confirmation    Collection Time: 10/20/20 11:57 AM   Result Value Ref Range    Hepatitis B Surface Antigen Non-Reactive Non Reactive   COVID-19 (SARS-CoV-2) only ([facility]) asymptomatic admission - Hospitals    Collection Time: 10/20/20  1:18 PM    Specimen: Nasopharyngeal   Result Value Ref Range    Purpose of COVID testing Screening     SARS-CoV-2 Specimen Source Nasal Swab     SARS CoV 2 Overall Result Not Detected    Glucose Whole Blood - POCT    Collection Time: 10/20/20  5:44 PM   Result Value Ref Range    Whole Blood Glucose POCT 324 (H) 70 - 100 mg/dL         IMAGING:  X-ray of the left hip  No radiographically apparent displaced fracture is present. Please note   that decreased bony mineralization can limit evaluation for nondisplaced   fractures.  Chest x-ray no significant change of diffuse interstitial markings suggestive of pulmonary vascular congestion    CARDIAC:  EKG Interpretation (upon my review):    NSR, PAC, minimal ST depression on lead V5-V6, no ST elevation  Markers:  Recent Labs   Lab 10/20/20  1157   Troponin I 0.42*       EMERGENCY DEPARTMENT COURSE:  Orders Placed This Encounter   Procedures    COVID-19 (SARS-CoV-2) only ([facility]) asymptomatic admission - Hospitals    Forearm Complete Left    Wrist Left PA Lateral and Oblique    XR Hip left 1 vw with pelvis    XR Chest PA Or AP     CBC and differential    B-type Natriuretic Peptide    Comprehensive metabolic panel    Phosphorus    Magnesium    Troponin I    GFR    Venous Blood Gas    Hepatitis B (HBV) Surface Antigen w/ Reflex to Confirmation    Lipid panel    Hemoglobin A1C    Troponin I    Troponin I    Comprehensive metabolic panel    CBC and differential    Hemoglobin A1C    Diet consistent carbohydrate and renal Protein restriction: 80 GM Protein    Cardiac monitoring (ED Only)    Notify    Hemodialysis Interventions    Catheter care (fistula and graft)    Notify physician    Activity as tolerated    Nasal Cannula Low-Flow (5 lpm or Less)    Catheter care (fistula and graft)    Vital signs    Pulse Oximetry    Progressive Mobility Protocol    Notify physician    NSG Communication: Glucose POCT order (PRN hypoglycemia)    I/O    Height    Weight    Skin assessment    Nursing communication: Adult Hypoglycemia Treatment Algorithm    Place sequential compression device    Maintain sequential compression device    Education: Activity    Education: Disease Process & Condition    Education: Pain Management    Education: Falls Risk    Education: Smoking Cessation    Telemetry 24 Hour Protocol    Notify Physician (Critical Blood Glucose Value)    Notify physician (Communication: Document Abnormal Blood Glucose)    Target Glucose Goals    Nursing communication    NSG Communication: Glucose POCT order (PRN hypoglycemia)    If NPO, give half NPH dose    NSG Communication: Glucose POCT order    Full Code    Glucose Whole Blood - POCT    Glucose Whole Blood - POCT    ECG 12 lead    Echocardiogram 2D complete    Saline lock IV    Hemodialysis inpatient    Admit to Inpatient    Supervise For Meals Frequency: All meals       ASSESSMENT & PLAN     TEWANNA SCHREY is a 65 y.o. female admitted under  with Shortness of breath.    #Dyspnea  -Due to fluid overload in the setting of missed HD  -Chest x-ray suggestive of pulmonary vascular congestion  -BNP  elevated at 2878  -Nephrology consulted and patient currently getting HD  -Reports significant improvement in shortness of breath  -Plan for repeat HD in a.m.  -Obtain 2D echo  -Telemetry monitoring    #Elevated troponin  -Suspect demand ischemia  -Twelve-lead EKG showed minimal ST depression in lead V5-V6  -Patient free of chest pain  -Continue ASA  -Trend troponin  -2D echo  -May consider cardiology consult in a.m.    #Hypertensive urgency  -Continue amlodipine and Coreg  -IV hydralazine as needed    #  Thrombocytopenia  -SCDs for DVT prophylaxis    #Fall  -PT/OT evaluation, fall precaution    #ESRD-DD  -MWF  -Continues Renvela    #OSA  -Counseled to use home CPAP    #DM-2 with hyperglycemia  -SSI, monitor fingersticks closely  -A1c  -Hypoglycemia protocol      GI Prophylaxis        Nutrition  CHO consistent/renal    DVT/VTE Prophylaxis  SCD due to thrombocytopenia    Anticipated medical stability for discharge: 2-3 days    Service status/Reason for ongoing hospitalization: Dyspnea  Anticipated Discharge Needs: TBD after PT/OT eval    Signed,  Charmaine Downs, MD  10/20/2020 10:17 PM  Time Elapsed: 1 hour

## 2020-10-20 NOTE — Progress Notes (Signed)
Arrived via stretcher from ED. Pt. Dx. Hyperglcemia and fluid overload. Alet and oriented x3. Face puffy. Bilateral lower extremity edema 2+ . Sob noted on exertion. 02 at 2 liters nc. Sat 100%. Telemetry sr. Good bruit and thrill in LAF. Left wrist brace noted. Pt. Stated she fell out of bed yesterday. No fx. Found. Consent for dialysis signed. Report received from [employer] Latif RN   10/20/20 1538   Dialysis Weight   Pre-Treatment Weight (Kg)   (UTW. ON STRETCHER)   Vitals   Temp 97.6 F (36.4 C)   Heart Rate 75   Resp Rate (!) 40   BP 134/61   SpO2 97 %   Assessment   Mental Status Alert;Oriented   Cardiac (WDL) WDL   Cardiac Regularity Regular   Cardiac Rhythm Normal Sinus Rhythm   Respiratory  X   Respiratory Pattern Dyspnea at rest   Bilateral Breath Sounds Diminished   Cough Dry   Edema  X   Facial +2   RLE Edema +2   LLE Edema +2   General Skin Color Appropriate for ethnicity   Skin Condition/Temp Warm   Gastrointestinal (WDL) WDL   Abdomen Inspection Soft;Rounded   GI Symptoms None   Mobility Bed   Pain Assessment   Charting Type Assessment   Pain Scale Used Numeric Scale (0-10)   Numeric Pain Scale   Pain Score 6   POSS Score 1   Pain Location Wrist   Pain Orientation Left   Pain Descriptors Sharp   Pain Frequency Continuous;Increases with movement   Hemodialysis Comments   Pre-Hemodialysis Comments TIME OUT DONE. CONSENT SIGNED

## 2020-10-20 NOTE — ED Provider Notes (Signed)
EMERGENCY DEPARTMENT HISTORY AND PHYSICAL EXAM     None        Date: 10/20/2020  Patient Name: [name]    This note was generated by the Epic EMR system/ Dragon speech recognition and may contain inherent errors or omissions not intended by the user. Grammatical errors, random word insertions, deletions and pronoun errors  are occasional consequences of this technology due to software limitations. Not all errors are caught or corrected. If there are questions or concerns about the content of this note or information contained within the body of this dictation they should be addressed directly with the author for clarification.    History of Presenting Illness     Chief Complaint   Patient presents with    Fall    Hip Pain    Wrist Pain       History Provided By: Patient    Chief Complaint: left hip and forearm pain  Onset: 1 day ago  Timing: Constant  Location: left side  Quality: ache  Radiation: none  Severity: Moderate  Exacerbating factors: movement/use  Alleviating factors: none tried  Associated Symptoms:   Pertinent Negatives: see ROS    Additional History: [name] is a 65 y.o. female with history of ESRD on dialysis Monday Wednesday Friday, last dialysis last Friday, presenting with left hip and wrist/forearm pain status post trip and fall last night.  Denies LOC.  Complain of constant, ache to the dorsum of her left forearm as well as lateral left hip.  No relieving factors.  Reports she cannot get her dialysis today due to ongoing pain.  Does report some shortness of breath.  Denies chest pain.  She still makes urine.  Arrived with left forearm and wrist velcrow splint that she had at home.    I reviewed patient's last ED visit, clinic visit or admission/discharge summary, as well as associated recent EKGs, lab or imaging results, if applicable.     PCP: Pcp, None, MD    Past History   Past Medical History:  Past Medical History:   Diagnosis Date    Chronic obstructive pulmonary  disease     Diabetes mellitus     Hyperlipidemia     Hypertension     Sleep apnea 2018       Past Surgical History:  Past Surgical History:   Procedure Laterality Date    EGD, BIOPSY N/A 05/14/2019    Procedure: EGD, BIOPSY;  Surgeon: Ronie Spies, MD;  Location: [facility];  Service: Gastroenterology;  Laterality: N/A;    EXPLORATORY LAPAROTOMY N/A 05/27/2019    Procedure: EXPLORATORY LAPAROTOMY;  Surgeon: Herbert Moors., MD;  Location: [facility];  Service: General;  Laterality: N/A;    JOINT REPLACEMENT      shoulder    JOINT REPLACEMENT      knee    OVARY SURGERY Left     Removal    REMOVAL, FOREIGN BODY N/A 05/27/2019    Procedure: REMOVAL, PERITONEAL DIALYSIS CATHETER;  Surgeon: Herbert Moors., MD;  Location: [facility];  Service: General;  Laterality: N/A;    TUNNELED CATH CHECK/CHANGE ([facility]ATH) N/A 07/03/2019    Procedure: TUNNELED CATH CHECK/CHANGE;  Surgeon: Maureen Ralphs, MD;  Location: [facility];  Service: Interventional Radiology;  Laterality: N/A;    TUNNELED CATH PLACEMENT ([facility]ATH) Bilateral 05/23/2019    Procedure: TUNNELED CATH PLACEMENT;  Surgeon: Jacques Earthly, MD;  Location: [facility];  Service: Interventional Radiology;  Laterality: Bilateral;    TUNNELED CATH PLACEMENT ([facility]ATH) Right 07/31/2019    Procedure: TUNNELED CATH PLACEMENT;  Surgeon: Jacques Earthly, MD;  Location: [facility];  Service: Interventional Radiology;  Laterality: Right;       Family History:  Family History   Problem Relation Age of Onset    Diabetes Mother     Hypertension Mother     Diabetes Father     Hypertension Father     Diabetes Sister     Hypertension Sister     Diabetes Brother     Hypertension Brother     Diabetes Paternal Aunt     Diabetes Paternal Uncle        Social History:  Social History     Tobacco Use    Smoking status: Never    Smokeless tobacco: Never   Vaping Use    Vaping Use: Never used   Substance Use Topics    Alcohol use: Never    Drug use: Never        Allergies:  Allergies   Allergen Reactions    Lisinopril Hives    Metformin Hives       Review of Systems     Review of Systems   Constitutional:  Negative for fever.   HENT:  Negative for rhinorrhea.    Eyes:  Negative for visual disturbance.   Respiratory:  Positive for shortness of breath.    Cardiovascular:  Negative for chest pain.   Gastrointestinal:  Negative for abdominal pain.   Genitourinary:  Negative for difficulty urinating.   Musculoskeletal:  Negative for back pain.   Skin:  Negative for rash.   Neurological:  Negative for headaches.     Other than pertinent positives as above, a complete ROS was reviewed and negative.    Physical Exam   BP 154/75   Pulse 79   Temp 97.6 F (36.4 C)   Resp 12   Ht '5\' 2"'$  (1.575 m)   Wt 100 kg   LMP  (LMP Unknown) Comment: post menopausal  SpO2 97%   BMI 40.32 kg/m     Physical Exam  Vitals and nursing note reviewed.   Constitutional:       Appearance: She is not toxic-appearing.      Comments: Diffusely edematous   HENT:      Head: Normocephalic and atraumatic.      Nose: Nose normal.   Eyes:      General:         Right eye: No discharge.         Left eye: No discharge.      Conjunctiva/sclera: Conjunctivae normal.   Cardiovascular:      Rate and Rhythm: Normal rate.      Pulses: Normal pulses.   Pulmonary:      Effort: No respiratory distress.      Comments: Tachypneic  Abdominal:      General: There is no distension.   Musculoskeletal:         General: No deformity.      Right lower leg: Edema present.      Left lower leg: Edema present.      Comments: Tenderness over the dorsum of her left distal forearm and entire left lateral upper leg.  Pelvis stable.  No overlying bruising.  Normal range of motion at hip joint bilaterally.   Skin:     General: Skin is warm and dry.   Neurological:  Mental Status: She is alert. Mental status is at baseline.   Psychiatric:         Mood and Affect: Mood normal.         Diagnostic Study Results     Labs  -  Reviewed, if relevant to the case.    Radiologic Studies -   Reviewed, if relevant to the case.      Medical Decision Making   I am the first provider for this patient.    I reviewed the vital signs, available nursing notes, past medical history, past surgical history, family history and social history.    Vital Signs: Reviewed the patient's vital signs during ED stay.    I reviewed pt's pulse oxymetry and cardiac monitor values, as relevant to the case.    Pulse Oximetry Analysis:  93% on RA - Hypoxic    Cardiac Monitor:  Rhythm:  Normal Sinus, Rate:  Normal, Ectopy:  None    Personal Protective Equipment (PPE)  Gloves, N95 and surgical mask.    Record Review: The following, if applicable to the case, were reviewed and noted:    Old medical records.  Nursing notes.  Outside records.     EKG:  Interpreted by this Emergency Physician.  EKG: NSR 90, no STE's or STD's, non-specific twave pattern, no STEMI, QTC 472    Clinical Decision Support:       Critical Care Time:   CRITICAL CARE: The high probability of sudden, clinically significant deterioration in the patient's condition required the highest level of my preparedness to intervene urgently.    The services I provided to this patient were to treat and/or prevent clinically significant deterioration that could result in: death  Services included the following: chart data review, reviewing nursing notes and/or old charts, documentation time, consultant collaboration regarding findings and treatment options, medication orders and management, direct patient care, re-evaluations, vital sign assessments and ordering, interpreting and reviewing diagnostic studies/lab tests.    Aggregate critical care time was 30mn, which includes only time during which I was engaged in work directly related to the patient's care, as described above, whether at the bedside or elsewhere in the Emergency Department.  It did not include time spent performing other reported procedures or  the services of residents, students, nurses or physician assistants.     For Hospitalized Patients:     1. Hospitalization Decision Time:  The decision to admit this patient was made by the emergency provider at 1451 on 10/20/2020      2. Core Measures:   - 12-lead EKG was performed in the ED. Aspirin: Aspirin was not given because the patient did not present with a stroke at the time of their Emergency Department evaluation..Marland Kitchen   SEP-1 Charting    A. ------------Severe Sepsis/Septic Shock Exclusion----------------------------    Pt is excluded from severe sepsis or septic shock consideration at 1451 [enter time of bed request placement] because of one or more reasons below:    All SIRS, abnl vitals, organ dysfunction are NOT due to severe sepsis or septic shock, but due to alternative cause: Other: CKD [list cause].     The timestamp from the preceding paragraph above also applies to every infectious and/or SEP-1-related diagnosis in Clinical Impression or MDM sections of this note.     Provider Notes/MDM:   Patient is a 65y.o. female who presented to the ED with complaint of left forearm pain and left hip pain status post mechanical 1 day ago.  On presentation, pt appears clinically fluid overloaded, HDS, hypoxic to low 90s; placed on 2 L nasal cannula with improvement in O2 sats.  X-rays of her pelvis,, forearm and wrist are negative for acute fracture.  Patient in Velcro splint for comfort.  Work-up was notable for troponin elevation 0.42; likely in part due to her renal disease demand ischemia from her dyspnea.  EKG is negative.  Her BNP is elevated to 2000s.  Patient given large dose of Lasix.  Discussed with nephro (see below) who plans to dialyze.  Will be admitted to medicine for further management    ED Course:   ED Course as of 10/20/20 1835   Wed Oct 20, 2020   1241 Troponin I(!!): 0.42 [MS]   1241 Troponinemia likely due to her renal disease; and demand from recently missed dialysis [MS]   1243  Creatinine(!): 8.0 [MS]   1243 B-Natriuretic Peptide(!): 2,878 [MS]   48 Spoke with nephrology; Dr. Silverio Decamp; plans for dialysis tongiht [MS]   1303 Starting lasix [MS]   1328 Wrist Left PA Lateral and Oblique  1. No acute fracture.  2. Diffuse soft tissue swelling, nonspecific.  3. Diffuse osteopenia.  4. Peripheral vascular disease. [MS]   1328 Forearm Complete Left  1. No acute fracture.  2. Diffuse soft tissue swelling, nonspecific.  3. Diffuse osteopenia.  4. Peripheral vascular disease. [MS]   39 Spoke with patient's nephrologist; Kwan,states she is familiar with patient; is chronically edematous and will dialysis tonight [MS]   1353 XR Hip left 1 vw with pelvis  No radiographically apparent displaced fracture is present. Please note  that decreased bony mineralization can limit evaluation for nondisplaced  fractures. [MS]      ED Course User Index  [MS] Jonathon Bellows, MD        Diagnosis     Clinical Impression:   1. Shortness of breath    2. ESRD (end stage renal disease)    3. Hypoxia        Disposition:   ED Disposition       ED Disposition   Admit    Condition   --    Date/Time   Wed Oct 20, 2020  2:51 PM    Comment   Admitting Physician: Charmaine Downs [id number]   Service:: Medicine [106]   Estimated Length of Stay: > or = to 2 midnights   Tentative Discharge Plan?: Home or Self Care [1]   Does patient need telemetry?: Yes   Telemetry type (separate Telemetry order is also required):: Medical telemetry                 The above diagnostic process was due to medical necessity based on risk stratification of potential harm of patient's presenting complaint.     Attestations: This note is prepared by Levy Pupa, MD. I am the first provider for this patient.        Jonathon Bellows, MD  10/20/20 [phone]

## 2020-10-20 NOTE — Progress Notes (Signed)
Completed 3.5 hours of HD. 3.3L UF tolerated well. Breathing improved. Oxygen Sat '@98'$ % on room air. For HD again tomorrow. Patient going back to ED due to no vacancy .Report given to [facility] RN.     10/20/20 1920   Treatment Summary   Time Off Machine 1911   Duration of Treatment (Hours) 3.5   Treatment Type 2:1   Dialyzer Clearance Moderately streaked   Fluid Volume Off (mL) 3800   Prime Volume (mL) 200   Rinseback Volume (mL) 200   Fluid Given: Normal Saline (mL) 100   Fluid Given: PRBC  0 mL   Fluid Given: Albumin (mL) 0   Fluid Given: Other (mL) 0   Total Fluid Given 500   Hemodialysis Net Fluid Removed 3300   Post Treatment Assessment   Post-Treatment Weight (Kg) 96.7   Patient Response to Treatment tolerated well   Vitals   Temp 98 F (36.7 C)   Heart Rate 85   Resp Rate 18   SpO2 98 %   O2 Device None (Room air)   Assessment   Mental Status Alert;Oriented;Cooperative   Cardiac (WDL) WDL   Cardiac Regularity Regular   Cardiac Rhythm Normal Sinus Rhythm   Respiratory  WDL   Respiratory Pattern Regular   Bilateral Breath Sounds Clear;Diminished   Edema  WDL   Facial Non Pitting Edema   RLE Edema +1   LLE Edema +1   General Skin Color Appropriate for ethnicity   Skin Condition/Temp Warm;Dry   Gastrointestinal (WDL) WDL   Abdomen Inspection Soft;Nondistended   GI Symptoms None   Mobility Bed   Pain Assessment   Charting Type Assessment   Pain Scale Used Numeric Scale (0-10)   Numeric Pain Scale   Pain Score 0   POSS Score 1   Education   Person taught Patient   Knowledge basis Substantial   Topics taught Access care;Procedure;Fluid Management   Teaching Tools Explain   Reponse Verbalizes Understanding   Bedside Nurse Communication   Name of bedside RN - post dialysis [employer]

## 2020-10-20 NOTE — Consults (Signed)
[facility]  CONSULT  [phone]      Date Time: 10/20/20 3:05 PM  Patient Name: [name]  Requesting Physician: Charmaine Downs, MD  Consulting Physician: Gaylyn Lambert, MD, MD    Primary Care Physician: Pcp, None, MD    Reason for Consultation: ESRD      Assessment:     Patient Active Problem List   Diagnosis    Iron deficiency anemia secondary to inadequate dietary iron intake    Sideropenic dysphagia    Peritoneal dialysis catheter dysfunction, initial encounter    Peritonitis    Upper GI bleed    ESRD (end stage renal disease) on dialysis    Generalized abdominal pain    Thrombocytopenia    h/o orthostatic hypotension    Type 2 diabetes mellitus, with long-term current use of insulin    GERD (gastroesophageal reflux disease)    Bowel incontinence    Right sided numbness    Uncontrolled type 2 diabetes mellitus with hyperglycemia    Hypertension    Hyperlipidemia    History of COPD    Gastritis with hemorrhage    History of anemia due to chronic kidney disease    Open wound, abdominal wall, lateral, subsequent encounter    Gout    Dizziness    Fall    History of CVA (cerebrovascular accident)    Pneumonia due to COVID-19 virus    Diarrhea    Hypertensive urgency    Shortness of breath       1. ESRD on HD MWF at [facility]   - reported last HD on Wednesday   2. Mild hyperkalemia   3. Metabolic acidosis due to ESRD  4. Anemia of CKD  5. HTN emergency 200/83  6. Volume Overload  7. Elevated Troponins  8. Resp Distress on 2L NC oxygen     Recommendations:     1. Stat HD today with 3L UF   2. Plan for HD again tomorrow with 3L UF   3. Hgb 9.8, check iron studies and ferritin. Hold off on EPO as BP very high.   4. Restart amlodipine '10mg'$  - home medicine   5. Expect BP to improve with UF         Charmaine Downs, MD, thank you for this consultation.  We will follow the patient with you during this hospitalization.  Please contact me with any questions or issues.    Signed by: Gaylyn Lambert, MD  [facility]  [phone] (office)      -----------------------------------------------------------------------------------------------------------        History of Presenting Illness:   [name] is a 65 y.o. female   with ESRD on HD MWF at [facility], DM type 2, HTN, HLD who presented to the hospital after a fall at home. Her last HD was on Wednesday a week ago, she reports that she did go to unit today but fell so did not get dialyzed. She is very somnolent currently with shortness of breath on 2L supplemental oxygen. She is unable to provide a reliable history due to AMS. She is very volume overloaded. She is also noted to have elevated troponins and cxr with pulm edema is currently being admitted for further cardiac work up. Nephrology is consulted for ESRD and stat HD.     Past Medical History:     Past Medical History:   Diagnosis Date    Chronic obstructive pulmonary disease     Diabetes mellitus  Hyperlipidemia     Hypertension     Sleep apnea 2018       Available old records reviewed, including:  notes, labs, rads    Past Surgical History:     Past Surgical History:   Procedure Laterality Date    EGD, BIOPSY N/A 05/14/2019    Procedure: EGD, BIOPSY;  Surgeon: Ronie Spies, MD;  Location: [facility];  Service: Gastroenterology;  Laterality: N/A;    EXPLORATORY LAPAROTOMY N/A 05/27/2019    Procedure: EXPLORATORY LAPAROTOMY;  Surgeon: Herbert Moors., MD;  [facility]: [facility];  Service: General;  Laterality: N/A;    JOINT REPLACEMENT      shoulder    JOINT REPLACEMENT      knee    OVARY SURGERY Left     Removal    REMOVAL, FOREIGN BODY N/A 05/27/2019    Procedure: REMOVAL, PERITONEAL DIALYSIS CATHETER;  Surgeon: Herbert Moors., MD;  Location: [facility];  Service: General;  Laterality: N/A;    TUNNELED CATH CHECK/CHANGE ([facility]MCATH) N/A 07/03/2019    Procedure: TUNNELED CATH CHECK/CHANGE;  Surgeon: Maureen Ralphs, MD;  [facility];   Service: Interventional Radiology;  Laterality: N/A;    TUNNELED CATH PLACEMENT ([facility]MCATH) Bilateral 05/23/2019    Procedure: TUNNELED CATH PLACEMENT;  Surgeon: Jacques Earthly, MD;  [facility];  Service: Interventional Radiology;  Laterality: Bilateral;    TUNNELED CATH PLACEMENT ([facility]ATH) Right 07/31/2019    Procedure: TUNNELED CATH PLACEMENT;  Surgeon: Jacques Earthly, MD;  [facility];  Service: Interventional Radiology;  Laterality: Right;       Family History:     Family History   Problem Relation Age of Onset    Diabetes Mother     Hypertension Mother     Diabetes Father     Hypertension Father     Diabetes Sister     Hypertension Sister     Diabetes Brother     Hypertension Brother     Diabetes Paternal Aunt     Diabetes Paternal Uncle        Social History:     Social History     Socioeconomic History    Marital status: Legally Separated     Spouse name: Not on file    Number of children: Not on file    Years of education: Not on file    Highest education level: Not on file   Occupational History    Not on file   Tobacco Use    Smoking status: Never    Smokeless tobacco: Never   Vaping Use    Vaping Use: Never used   Substance and Sexual Activity    Alcohol use: Never    Drug use: Never    Sexual activity: Not Currently   Other Topics Concern    Not on file   Social History Narrative    Not on file     Social Determinants of Health     Financial Resource Strain: Not on file   Food Insecurity: Not on file   Transportation Needs: Not on file   Physical Activity: Not on file   Stress: Not on file   Social Connections: Not on file   Intimate Partner Violence: Not on file   Housing Stability: Not on file       Allergies:     Allergies   Allergen Reactions    Lisinopril Hives    Metformin Hives  Medications:   (Not in a hospital admission)       Scheduled Meds: PRN Meds:           Continuous Infusions:   sodium chloride, 100 mL, Q1H PRN  sodium chloride, 250 mL, PRN            Review  of Systems:   A comprehensive review of systems was per the HPI and below:   General ROS: no f/c, no weight changes  HEENT ROS: no blurry vision, no oral lesions, no epistaxis  Allergy/Immunology ROS: no new allergic reactions  Hematological and Lymphatic ROS: no known bleeding/clotting disorders  Respiratory ROS: negative for cough, shortness of breath, or wheezing  Cardiovascular ROS: negative for chest pain or dyspnea on exertion  Gastrointestinal ROS: negative for abd/flank pain, change in bowel habits  Genito-Urinary ROS: negative for dysuria, hematuria, difficulty voiding or nocturia  Musculoskeletal ROS:  negative for trauma or falls, arthralgias  Neurological ROS: no focal weakness, no dizziness  Endocrine ROS: no change in hair distribution  Dermatological ROS: no new rashes or lesions      Physical Exam:     Vitals:    10/20/20 1218 10/20/20 1300 10/20/20 1400 10/20/20 1454   BP: 156/72 200/83 151/68    Pulse: 85 82 81 75   Resp: '22 22 21 '$ (!) 33   Temp:       TempSrc:       SpO2: 94% 96% 96% 97%   Weight:       Height:           Intake and Output Summary (Last 24 hours) at Date Time  No intake or output data in the 24 hours ending 10/20/20 1505    Recent weights:  Weight Monitoring 01/07/2020 01/08/2020 01/19/2020 01/20/2020 01/21/2020 01/22/2020 10/20/2020   Height - - 157.5 cm 157.5 cm - - 157.5 cm   Height Method - - - - - - Stated   Weight 98.431 kg (No Data) 98.431 kg 101.4 kg 105.4 kg 98.6 kg 100 kg   Weight Method Bed Scale - - - Bed Scale Bed Scale Estimated   BMI (calculated) - - 39.8 kg/m2 41 kg/m2 - - 40.4 kg/m2       General: arousable, somnolent, oriented x 2, mild acute distress.  HEENT: sclera anicteric, oropharynx clear without lesions, mucous membranes moist  Neck: supple, no lymphadenopathy, no thyromegaly, no JVD, no carotid bruits  Cardiovascular: regular rate and rhythm, no murmurs, rubs or gallops  Lungs: rales  bilaterally, without wheezing, rhonchi  Abdomen: soft, non-tender,  non-distended, normoactive bowel sounds, no palpable masses, no hepatosplenomegaly, no rebound or guarding  Extremities: no clubbing, cyanosis, or edema  Neuro: Arousable, oriented x 2,  no gross motor/sensory deficits  Skin: no rashes or lesions noted    Lines : LUE AVF       Labs:     Recent Labs   Lab 10/20/20  1157   WBC 7.68   Hgb 9.8*   Hematocrit 31.2*   Platelets 65*     Recent Labs   Lab 10/20/20  1157   Sodium 135   Potassium 4.7   Chloride 100   CO2 22   BUN 64.0*   Creatinine 8.0*   Calcium 7.9*   Albumin 2.7*   Phosphorus 5.4*   Magnesium 2.0   Glucose 476*   EGFR 6.1             Invalid input(s): LEUKOCYTESUR  Imaging personally reviewed, including: cxr      cc: Charmaine Downs, MD  Pcp, None, MD

## 2020-10-20 NOTE — ED Notes (Signed)
Bed: GR11  Expected date:   Expected time:   Means of arrival:   Comments:  CN HOLD

## 2020-10-20 NOTE — ED Notes (Signed)
Report given to  Dialysis RN at this time.

## 2020-10-21 ENCOUNTER — Inpatient Hospital Stay: Payer: 59

## 2020-10-21 ENCOUNTER — Other Ambulatory Visit: Payer: Self-pay

## 2020-10-21 LAB — CBC AND DIFFERENTIAL
Absolute NRBC: 0 10*3/uL (ref 0.00–0.00)
Basophils Absolute Automated: 0.01 10*3/uL (ref 0.00–0.08)
Basophils Automated: 0.2 %
Eosinophils Absolute Automated: 0.06 10*3/uL (ref 0.00–0.44)
Eosinophils Automated: 1.1 %
Hematocrit: 30.2 % — ABNORMAL LOW (ref 34.7–43.7)
Hgb: 9.3 g/dL — ABNORMAL LOW (ref 11.4–14.8)
Immature Granulocytes Absolute: 0.02 10*3/uL (ref 0.00–0.07)
Immature Granulocytes: 0.4 %
Lymphocytes Absolute Automated: 0.94 10*3/uL (ref 0.42–3.22)
Lymphocytes Automated: 17.6 %
MCH: 29.7 pg (ref 25.1–33.5)
MCHC: 30.8 g/dL — ABNORMAL LOW (ref 31.5–35.8)
MCV: 96.5 fL — ABNORMAL HIGH (ref 78.0–96.0)
MPV: 12.8 fL — ABNORMAL HIGH (ref 8.9–12.5)
Monocytes Absolute Automated: 0.65 10*3/uL (ref 0.21–0.85)
Monocytes: 12.2 %
Neutrophils Absolute: 3.66 10*3/uL (ref 1.10–6.33)
Neutrophils: 68.5 %
Nucleated RBC: 0 /100 WBC (ref 0.0–0.0)
Platelets: 67 10*3/uL — ABNORMAL LOW (ref 142–346)
RBC: 3.13 10*6/uL — ABNORMAL LOW (ref 3.90–5.10)
RDW: 13 % (ref 11–15)
WBC: 5.34 10*3/uL (ref 3.10–9.50)

## 2020-10-21 LAB — COMPREHENSIVE METABOLIC PANEL
ALT: 32 U/L (ref 0–55)
AST (SGOT): 12 U/L (ref 5–41)
Albumin/Globulin Ratio: 0.8 — ABNORMAL LOW (ref 0.9–2.2)
Albumin: 2.4 g/dL — ABNORMAL LOW (ref 3.5–5.0)
Alkaline Phosphatase: 151 U/L — ABNORMAL HIGH (ref 37–117)
Anion Gap: 10 (ref 5.0–15.0)
BUN: 37 mg/dL — ABNORMAL HIGH (ref 7.0–21.0)
Bilirubin, Total: 0.6 mg/dL (ref 0.2–1.2)
CO2: 26 mEq/L (ref 17–29)
Calcium: 7.8 mg/dL — ABNORMAL LOW (ref 8.5–10.5)
Chloride: 102 mEq/L (ref 99–111)
Creatinine: 5.2 mg/dL — ABNORMAL HIGH (ref 0.4–1.0)
Globulin: 3 g/dL (ref 2.0–3.6)
Glucose: 515 mg/dL (ref 70–100)
Potassium: 4.2 mEq/L (ref 3.5–5.3)
Protein, Total: 5.4 g/dL — ABNORMAL LOW (ref 6.0–8.3)
Sodium: 138 mEq/L (ref 135–145)

## 2020-10-21 LAB — LIPID PANEL
Cholesterol / HDL Ratio: 2.8 Index
Cholesterol: 153 mg/dL (ref 0–199)
HDL: 55 mg/dL (ref 40–9999)
LDL Calculated: 83 mg/dL (ref 0–99)
Triglycerides: 75 mg/dL (ref 34–149)
VLDL Calculated: 15 mg/dL (ref 10–40)

## 2020-10-21 LAB — GLUCOSE WHOLE BLOOD - POCT
Whole Blood Glucose POCT: 175 mg/dL — ABNORMAL HIGH (ref 70–100)
Whole Blood Glucose POCT: 238 mg/dL — ABNORMAL HIGH (ref 70–100)
Whole Blood Glucose POCT: 240 mg/dL — ABNORMAL HIGH (ref 70–100)
Whole Blood Glucose POCT: 309 mg/dL — ABNORMAL HIGH (ref 70–100)
Whole Blood Glucose POCT: 311 mg/dL — ABNORMAL HIGH (ref 70–100)
Whole Blood Glucose POCT: 440 mg/dL — ABNORMAL HIGH (ref 70–100)

## 2020-10-21 LAB — CK: Creatine Kinase (CK): 33 U/L (ref 29–233)

## 2020-10-21 LAB — HEMOGLOBIN A1C
Average Estimated Glucose: 292 mg/dL
Hemoglobin A1C: 11.8 % — ABNORMAL HIGH (ref 4.6–5.9)

## 2020-10-21 LAB — TROPONIN I: Troponin I: 0.35 ng/mL (ref 0.00–0.05)

## 2020-10-21 LAB — GFR: EGFR: 10

## 2020-10-21 MED ORDER — INSULIN LISPRO 100 UNIT/ML SOLN (WRAP)
5.0000 [IU] | Freq: Once | Status: AC
Start: 2020-10-21 — End: 2020-10-21
  Administered 2020-10-21: 06:00:00 5 [IU] via SUBCUTANEOUS
  Filled 2020-10-21: qty 15

## 2020-10-21 MED ORDER — INSULIN LISPRO 100 UNIT/ML SOLN (WRAP)
2.0000 [IU] | Freq: Three times a day (TID) | Status: DC
Start: 2020-10-21 — End: 2020-10-25
  Administered 2020-10-21 (×2): 8 [IU] via SUBCUTANEOUS
  Administered 2020-10-22: 15:00:00 6 [IU] via SUBCUTANEOUS
  Administered 2020-10-22 (×2): 2 [IU] via SUBCUTANEOUS
  Administered 2020-10-23 (×2): 10 [IU] via SUBCUTANEOUS
  Administered 2020-10-23: 18:00:00 8 [IU] via SUBCUTANEOUS
  Administered 2020-10-24: 18:00:00 6 [IU] via SUBCUTANEOUS
  Administered 2020-10-24 (×2): 10 [IU] via SUBCUTANEOUS
  Administered 2020-10-25: 09:00:00 6 [IU] via SUBCUTANEOUS
  Filled 2020-10-21 (×2): qty 30
  Filled 2020-10-21: qty 24
  Filled 2020-10-21: qty 6
  Filled 2020-10-21: qty 24
  Filled 2020-10-21: qty 18
  Filled 2020-10-21: qty 15
  Filled 2020-10-21: qty 24
  Filled 2020-10-21: qty 30
  Filled 2020-10-21 (×2): qty 18
  Filled 2020-10-21: qty 30

## 2020-10-21 MED ORDER — SODIUM CHLORIDE 0.9 % IV BOLUS
100.0000 mL | INTRAVENOUS | Status: AC | PRN
Start: 2020-10-21 — End: 2020-10-21

## 2020-10-21 MED ORDER — INSULIN LISPRO 100 UNIT/ML SOLN (WRAP)
1.0000 [IU] | Freq: Every evening | Status: DC
Start: 2020-10-21 — End: 2020-10-25
  Administered 2020-10-21: 22:00:00 2 [IU] via SUBCUTANEOUS
  Administered 2020-10-22 – 2020-10-24 (×3): 4 [IU] via SUBCUTANEOUS
  Filled 2020-10-21: qty 15
  Filled 2020-10-21 (×3): qty 12

## 2020-10-21 MED ORDER — DEXTROSE 10 % IV BOLUS
25.0000 g | INTRAVENOUS | Status: DC | PRN
Start: 2020-10-21 — End: 2020-10-25

## 2020-10-21 MED ORDER — DEXTROSE 5% IV BOLUS
250.0000 mL | INTRAVENOUS | Status: DC | PRN
Start: 2020-10-21 — End: 2020-10-25

## 2020-10-21 MED ORDER — OXYCODONE HCL 5 MG PO TABS
5.0000 mg | ORAL_TABLET | ORAL | Status: DC | PRN
Start: 2020-10-21 — End: 2020-10-25
  Administered 2020-10-21 – 2020-10-25 (×9): 5 mg via ORAL
  Filled 2020-10-21 (×10): qty 1

## 2020-10-21 MED ORDER — ACETAMINOPHEN 500 MG PO TABS
1000.0000 mg | ORAL_TABLET | Freq: Three times a day (TID) | ORAL | Status: DC
Start: 2020-10-21 — End: 2020-10-25
  Administered 2020-10-21 – 2020-10-25 (×12): 1000 mg via ORAL
  Filled 2020-10-21 (×12): qty 2

## 2020-10-21 MED ORDER — INSULIN GLARGINE 100 UNIT/ML SC SOLN
20.0000 [IU] | Freq: Every evening | SUBCUTANEOUS | Status: DC
Start: 2020-10-21 — End: 2020-10-24
  Administered 2020-10-21 – 2020-10-23 (×3): 20 [IU] via SUBCUTANEOUS
  Filled 2020-10-21 (×3): qty 20

## 2020-10-21 MED ORDER — INSULIN LISPRO 100 UNIT/ML SOLN (WRAP)
5.0000 [IU] | Freq: Three times a day (TID) | Status: DC
Start: 2020-10-21 — End: 2020-10-25
  Administered 2020-10-21 – 2020-10-25 (×11): 5 [IU] via SUBCUTANEOUS
  Filled 2020-10-21 (×2): qty 15

## 2020-10-21 MED ORDER — GLUCAGON 1 MG IJ SOLR (WRAP)
1.0000 mg | INTRAMUSCULAR | Status: DC | PRN
Start: 2020-10-21 — End: 2020-10-25

## 2020-10-21 MED ORDER — LIDOCAINE 5 % EX PTCH
1.0000 | MEDICATED_PATCH | CUTANEOUS | Status: DC
Start: 2020-10-21 — End: 2020-10-25
  Administered 2020-10-21 – 2020-10-24 (×4): 1 via TRANSDERMAL
  Filled 2020-10-21 (×4): qty 1

## 2020-10-21 MED ORDER — DEXTROSE 50 % IV SOLN
25.0000 g | INTRAVENOUS | Status: DC | PRN
Start: 2020-10-21 — End: 2020-10-25

## 2020-10-21 MED ORDER — SODIUM CHLORIDE 0.9 % IV BOLUS
250.0000 mL | INTRAVENOUS | Status: AC | PRN
Start: 2020-10-21 — End: 2020-10-21

## 2020-10-21 NOTE — OT Eval Note (Addendum)
[facility]   Occupational Therapy Attempt Note    Patient:  [name] MRN#:  [id number]  Unit:  [place] Room/Bed:  [phone]    2nd attempt-    Re-attempted OT evaluation- pt currently off the unit in dialysis.  OT will follow up tomorrow 10/22/20.  Notes indicate pt is MWF schedule for HD- but had a missed session this week on Monday-    RN made aware.    Mickel Baas Lajeana Strough OTR/L   10/21/2020 3:50 PM

## 2020-10-21 NOTE — Consults (Signed)
INITIAL HEMATOLOGY CONSULT     Dr. 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 difficile colitis  Elevated ALK phos  Elevated troponin  Hypocalcemia    Smear Reviewed: Microcytic Hypochromic RBC with moderate anisocytosis, rare schistocyte, normal appearing wbc, platelets are decreased in number, rare giant platelet seen.    Plan:  Will check ANA/ Hep C/ HIV/Markers for hemolysis. Would check abdominal sonogram for spleen size  Platelets are above threshold of intervention  Monitor H/H and Platelets   Will check for reversible nutritional risk factor including Iron/TIBC/ferritin/b12/retic/LDH - continue iron repletion with dialysis and ESA therapy  Continue home meds for comorbid conditions      Cindi Carbon, MD    10/21/2020  4:39 PM       HISTORY OF PRESENT ILLNESS     [name] is 65 y.o. female who is seen in Initial Hematology Consultation with PMHx of hypertension, hyperlipidemia, diabetes, OSA not compliant with CPAP, ESRD-DD      Dec 27, Jan 22, 2020: Patient was admitted in hospital with shortness of breath and wrist pain. Lab demonstrate WBC 4.02, hemoglobin 7.8, hematocrit 26.8, MCV 100.0, platelet 135, Creatinine 5.7, calcium 8.1. She had evidence of fluid overload on imaging studies. She was seen by nephrology group Dr. Elisabeth Cara and associates and underwent hemodialysis and supplemental oxygen. She had missed dialysis a couple of times which resulted in her admission to the hospital.    Oct 20, 2020: Patient was  admitted in hospital with fall and left hip pain. Patient reports that she fell yesterday while trying to get out of bed. She struck the left side of her body and her head but did not lose consciousness. She was able to get herself up and walk despite experiencing constant sharp left hip pain shoulder pain. She also noticed puffiness of her face and increased shortness of breath since this morning.    At the ED her BP was elevated at 200/83. Review of blood work was significant for H/H of 9.8/31.2, platelets 65, troponin 0.42, BNP 2878, fingersticks 476. EKG shows NSR with PAC and mild ST depression in V5 and V6. X-ray of the left hip showed no radiographically apparent displaced fracture is present. Chest x-ray chest showed no significant change of diffuse interstitial markings suggestive of pulmonary vascular congestion.    Oct 20, 2020: Seen by Dr. Gaylyn Lambert (Nephrology) for ESRD on HD MWF at [facility]- reported last HD on Wednesday. Mild hyperkalemia. Metabolic acidosis due to ESRD. Anemia of CKD. Plan for HD again. Check iron studies and ferritin. Hold off on EPO as BP very high.     Oct 21, 2020: Seen by Dr. Lucious Groves (Physician) for pulmonary edema, elevated troponin, severe left wrist pain, thrombocytopenia. Bilateral lower extremity edema. Recommended for CT scan of the left wrist and forearm. Ultrasound Doppler. Add lantus 20 units and lispro 5units.  PAST HISTORY        Primary Care Provider: Pcp, None, MD        PMH/PSH:    Past Medical History:   Diagnosis Date    Chronic obstructive pulmonary disease     Diabetes mellitus     Hyperlipidemia     Hypertension     Sleep apnea 2018     Past Surgical History:   Procedure Laterality Date    EGD, BIOPSY N/A 05/14/2019    Procedure: EGD, BIOPSY;  Surgeon: Ronie Spies, MD;  Location: [facility];  Service: Gastroenterology;  Laterality: N/A;    EXPLORATORY LAPAROTOMY N/A 05/27/2019    Procedure: EXPLORATORY LAPAROTOMY;  Surgeon:  Herbert Moors., MD;  Location: [facility];  Service: General;  Laterality: N/A;    JOINT REPLACEMENT      shoulder    JOINT REPLACEMENT      knee    OVARY SURGERY Left     Removal    REMOVAL, FOREIGN BODY N/A 05/27/2019    Procedure: REMOVAL, PERITONEAL DIALYSIS CATHETER;  Surgeon: Herbert Moors., MD;  Location: [facility];  Service: General;  Laterality: N/A;    TUNNELED CATH CHECK/CHANGE (PERMCATH) N/A 07/03/2019    Procedure: TUNNELED CATH CHECK/CHANGE;  Surgeon: Maureen Ralphs, MD;  Location: [facility];  Service: Interventional Radiology;  Laterality: N/A;    TUNNELED CATH PLACEMENT (PERMCATH) Bilateral 05/23/2019    Procedure: TUNNELED CATH PLACEMENT;  Surgeon: Jacques Earthly, MD;  Location: [facility];  Service: Interventional Radiology;  Laterality: Bilateral;    TUNNELED CATH PLACEMENT (PERMCATH) Right 07/31/2019    Procedure: TUNNELED CATH PLACEMENT;  Surgeon: Jacques Earthly, MD;  Location: [facility];  Service: Interventional Radiology;  Laterality: Right;       Social/Family History:    Family History   Problem Relation Age of Onset    Diabetes Mother     Hypertension Mother     Diabetes Father     Hypertension Father     Diabetes Sister     Hypertension Sister     Diabetes Brother     Hypertension Brother     Diabetes Paternal Aunt     Diabetes Paternal Uncle      Social History     Socioeconomic History    Marital status: Legally Separated   Tobacco Use    Smoking status: Never    Smokeless tobacco: Never   Vaping Use    Vaping Use: Never used   Substance and Sexual Activity    Alcohol use: Never    Drug use: Never    Sexual activity: Not Currently       Allergies: She is allergic to lisinopril and metformin.             OBJECTIVE       Visit Vitals  BP 111/59   Pulse 69   Temp 97.6 F (36.4 C)   Resp 18   Ht 1.575 m ('5\' 2"'$ )   Wt 107 kg (235 lb 14.3 oz)   LMP  (LMP Unknown)   SpO2 96%   BMI 43.15 kg/m       Body mass index is 43.15 kg/m.      Intake/Output Summary (Last 24  hours) at 10/21/2020 1639  Last data filed at 10/21/2020 1156  Gross per 24 hour   Intake --   Output 3650 ml   Net -3650 ml        REVIEW OF SYSTEMS  Constitutional: no fever, chills  Eye: no recent visual problems, or pain  ENMT: no ear pain, nasal congestion or sore throat  Respiratory: positive for shortness of breath, no cough  Cardiovascular: no Chest pain, palpitations or syncope  Gastrointestinal: no Abdominal Pain, nausea, vomiting or diarrhea  Genitourinary: no frequency, dysuria, pneumaturia or hematuria  Hema/Lymph: no bruising tendency or swollen lymph glands  Endocrine: no excessive thirst or excessive hunger  Musculoskeletal: no back pain, neck pain, joint pain, muscle pain, or decreased range of motion  Integumentary: no rash, pruritus or abrasions  Neurologic: no headache, confusion, numbness, weakness, seizures, or gait disturbance  Psychiatric: no anxiety, depression, or suicidal thoughts       PHYSICAL EXAM     General: well nourished, no acute distress.  Skin: warm and dry, no rashes or lesions.  Eye: PERRL, EOMI, normal conjunctiva, vision grossly normal  HENT: normocephalic, atraumatic, normal gross hearing, moist oral mucosa, no scleral icterus, no sinus tenderness, no nasal discharge  Neck: supple, non-tender, no JVD, no lymphadenopathy.  Lungs: clear to auscultation, non-labored respiration.  Chest Wall: no deformity  Heart: normal rate, regular rhythm, no murmur.  Abdomen: soft, non-tender, no guarding or rebound, non-distended, normal bowel sounds, no masses.  Musculoskeletal: Tenderness over the dorsum of her left distal forearm and entire left lateral upper leg. normal range of motion and strength, no swelling.  Lymphatic: B/L lower leg edema, no clubbing   Neurologic: awake, alert, and oriented X3, CN II-XII grossly intact, normal and symmetric strength and sensation, non-focal exam.  Psychiatric: cooperative, appropriate mood and affect.       MEDICATIONS      Scheduled Meds: PRN  Meds:    acetaminophen, 1,000 mg, Oral, Q8H  amLODIPine, 10 mg, Oral, Daily  aspirin, 81 mg, Oral, Daily  carvedilol, 25 mg, Oral, Q12H SCH  gabapentin, 600 mg, Oral, Daily  insulin lispro, 1-6 Units, Subcutaneous, QHS  insulin lispro, 2-10 Units, Subcutaneous, TID AC  sevelamer, 800 mg, Oral, TID MEALS  vitamin C, 500 mg, Oral, Daily         glucagon (rDNA), 1 mg, PRN   And  dextrose, 250 mL, PRN   And  dextrose, 25 g, PRN   And  dextrose, 25 g, PRN  hydrALAZINE, 10 mg, Q6H PRN  melatonin, 3 mg, QHS PRN  naloxone, 0.2 mg, PRN  oxyCODONE, 5 mg, Q4H PRN  potassium chloride, 0-40 mEq, PRN   And  potassium chloride, 10 mEq, PRN  sodium chloride, 100 mL, Q1H PRN  sodium chloride, 250 mL, PRN           Home Meds:      Home Medications       Med List Status: Complete Set By: Blase Mess, RN at 10/20/2020  9:48 PM      Status Comment        10/20/2020  9:49 PM     Pt reports taking other medication, but unsure of which              amLODIPine (NORVASC) 10 MG tablet     Take 1 tablet (10 mg total) by mouth daily     aspirin 81 MG chewable tablet     Chew 81 mg by mouth daily     carvedilol (COREG) 25 MG tablet     Take 1 tablet by mouth daily     gabapentin (NEURONTIN) 600 MG tablet     Take 1 tablet by mouth daily  sevelamer (RENVELA) 800 MG tablet     Take 800 mg by mouth 3 (three) times daily with meals     vitamin C (ASCORBIC ACID) 500 MG tablet     Take 1 tablet (500 mg total) by mouth daily                                                                   Flagged for Removal               atorvastatin (LIPITOR) 40 MG tablet     Take 40 mg by mouth daily                 I personally reviewed all of the medications       LABS     Recent Labs   Lab 10/21/20  0353 10/20/20  1157   Glucose 515* 476*   BUN 37.0* 64.0*   Creatinine 5.2* 8.0*   EGFR 10.0 6.1   Calcium 7.8* 7.9*   Sodium 138 135   Potassium 4.2 4.7   Chloride 102 100   CO2 26 22   Albumin 2.4* 2.7*   Phosphorus  --  5.4*   Magnesium  --  2.0   AST  (SGOT) 12 22   ALT 32 41   Bilirubin, Total 0.6 0.5   Alkaline Phosphatase 151* 186*     Recent Labs   Lab 10/21/20  0353 10/20/20  1157   WBC 5.34 7.68   Hgb 9.3* 9.8*   Hematocrit 30.2* 31.2*   MCV 96.5* 95.4   MCH 29.7 30.0   MCHC 30.8* 31.4*   Platelets 67* 65*     Band Neutrophils Absolute   Date Value Ref Range Status   01/05/2020 0.00 0.00 - 1.00 x10 3/uL Final       Iron   Date Value Ref Range Status   01/05/2020 50 40 - 145 ug/dL Final   06/26/2019 38 (L) 40 - 145 ug/dL Final     TIBC   Date Value Ref Range Status   01/05/2020 188 (L) 265 - 497 ug/dL Final   06/26/2019 169 (L) 265 - 497 ug/dL Final     Ferritin   Date Value Ref Range Status   12/31/2019 3,158.40 (H) 4.60 - 204.00 ng/mL Final   05/13/2019 1,070.90 (H) 4.60 - 204.00 ng/mL Final     Folate   Date Value Ref Range Status   01/05/2020 8.3 See below ng/mL Final     Comment:     Deficient    : <3.5 ng/mL  Intermediate : 3.5 - 5.4 ng/mL  Normal       : Greater Than 5.4 ng/mL       Vitamin B-12   Date Value Ref Range Status   01/05/2020 850 211 - 911 pg/mL Final   07/02/2019 354 211 - 911 pg/mL Final     No results found for: HAPTOGLOBIN, LDH, RETICULOCYTE, DIRCOOMBSIGG, EPO    No results for input(s): PTT, PT, INR in the last 72 hours.        Microbiology:  Microbiology Results (last 15 days)       Procedure Component Value Units Date/Time    COVID-19 (SARS-CoV-2) only ([facility]) asymptomatic admission - Hospitals [  [id number] Collected: 10/20/20 1318    Order Status: Completed Specimen: Nasopharyngeal Updated: 10/20/20 1351     Purpose of COVID testing Screening     SARS-CoV-2 Specimen Source Nasal Swab     SARS CoV 2 Overall Result Not Detected     Comment: __________________________________________________  -A result of "Detected" indicates POSITIVE for the    presence of SARS CoV-2 RNA  -A result of "Not Detected" indicates NEGATIVE for the    presence of SARS CoV-2 RNA  __________________________________________________________  Test performed  using the Roche cobas [facility] SARS-CoV-2 assay. This assay is  only for use under the Food and Drug Administration's Emergency Use  Authorization. This is a real-time RT-PCR assay for the qualitative  detection of SARS-CoV-2 RNA. Viral nucleic acids may persist in vivo,  independent of viability. Detection of viral nucleic acid does not imply the  presence of infectious virus, or that virus nucleic acid is the cause of  clinical symptoms. Negative results do not preclude SARS-CoV-2 infection and  should not be used as the sole basis for diagnosis, treatment or other  patient management decisions. Negative results must be combined with  clinical observations, patient history, and/or epidemiological information.  Invalid results may be due to inhibiting substances in the specimen and  recollection should occur. Please see Fact Sheets for patients and providers  located:  https://www.benson-chung.com/         Narrative:      o Collect and clearly label specimen type:  o PREFERRED-Upper respiratory specimen: One Nasal Swab in  Transport Media.  o Hand deliver to laboratory ASAP  Indication for testing->Extended care facility admission to  semi private room  Screening             IMAGING     Diagnostic Imaging:  XR Hip left 1 vw with pelvis    Result Date: 10/20/2020  No radiographically apparent displaced fracture is present. Please note that decreased bony mineralization can limit evaluation for nondisplaced fractures. Vernie Shanks, MD  10/20/2020 1:27 PM    XR Chest PA Or AP    Result Date: 10/20/2020  No significant interval change of diffuse interstitial markings, suggestive of pulmonary vascular congestion. Elnita Maxwell, MD  10/20/2020 1:19 PM    Forearm Complete Left    Result Date: 10/20/2020  1. No acute fracture. 2. Diffuse soft tissue swelling, nonspecific. 3. Diffuse osteopenia. 4. Peripheral vascular disease. Lonia Skinner, MD  10/20/2020 1:11 PM    Wrist Left PA Lateral and Oblique    Result Date:  10/20/2020  1. No acute fracture. 2. Diffuse soft tissue swelling, nonspecific. 3. Diffuse osteopenia. 4. Peripheral vascular disease. Lonia Skinner, MD  10/20/2020 1:11 PM    CT Head WO Contrast    Result Date: 10/21/2020   No currently detectable acute intracranial hemorrhage or mass effect or edema. Mildly suboptimal study for detection of small amounts of hemorrhage due to multiple regions of beam hardening artifact in the periphery of the frontal lobes.. Note: Note that CT scanning at this site  utilizes multiple dose reduction techniques including automatic exposure control, adjustment of the MAA and/or KVP according to patient's size and use of iterative reconstruction technique Elyn Peers, MD  10/21/2020 8:16 AM

## 2020-10-21 NOTE — Plan of Care (Signed)
Problem: Renal Instability  Goal: Fluid and electrolyte balance are achieved/maintained  Flowsheets (Taken 10/21/2020 0236 by Blase Mess, RN)  Fluid and electrolyte balance are achieved/maintained:   Monitor intake and output every shift   Monitor daily weight   Monitor/assess lab values and report abnormal values   Provide adequate hydration     Problem: Patient Receiving Advanced Renal Therapies  Goal: Therapy access site remains intact  Flowsheets (Taken 10/20/2020 1549 by Erin Hearing, RN)  Therapy access site remains intact: Assess therapy access site     Problem: Renal Instability  Goal: Fluid and electrolyte balance are achieved/maintained  Flowsheets (Taken 10/21/2020 0236 by Blase Mess, RN)  Fluid and electrolyte balance are achieved/maintained:   Monitor intake and output every shift   Monitor daily weight   Monitor/assess lab values and report abnormal values   Provide adequate hydration

## 2020-10-21 NOTE — OT Eval Note (Signed)
[facility]   Occupational Therapy Attempt Note    Patient:  [name] MRN#:  [id number]  Unit:  [place] Room/Bed:  [id number]      Occupational Therapy evaluation attempted on 10/21/2020 at 2:30 pm- pt just received new pain medication by mouth- will follow up at least 30 mins from now so pain meds can take effect.  Pt is aware.    RN aware.    Mickel Baas Elgie Landino OTR/L   10/21/2020 2:39 PM

## 2020-10-21 NOTE — Progress Notes (Addendum)
Hospitalist Progress note       Patient: [name]  Date: 10/21/2020   LOS: 1 Days  Admission Date: 10/20/2020   MRN: [id number]  Attending: Dillard Essex, MD  Please contact me on epic chat        ASSESSMENT/PLAN     [name] is a 65 y.o. female admitted with Shortness of breath    Interval Summary:     10/21/2020 plans for CT bilateral hips, CT of the left wrist and forearm to rule out fractures after a fall due to severe pain    Patient Active Hospital Problem List:    Shortness of breath and pulmonary edema   Chest x-ray with  interstitial markings suggestive of pulmonary vascular congestion.  likely due to missed dialysis  Status post dialysis on 10/20/2020 and will get another dialysis on 10/21/2020 appreciate nephrology consult    Elevated troponin possibly demand ischemia in the setting of end-stage renal disease and pulmonary edema due to missed dialysis cannot rule out CAD.  Patient denied chest pain  Echocardiogram done on 07/21/2019 shows normal EF with 65 to 70% with mild mitral regurgitation small to moderate circumferential pericardial effusion  [place] heart non urgent consult line contacted for evaluation   ASA 81 mg daily     Severe left wrist pain with severe bilateral hip pain more on the left after a fall at home.  Rule out fracture  Reviewed all x-ray no evidence of fracture but shows osteopenia unable to rule out nondisplaced fracture we will get CT scan of the left wrist and forearm and CT scan bilateral hip to rule out fractures.    Thrombocytopenia  No signs of bleeding  Hematology consulted    Diabetes mellitus  Type II uncontrolled   Continue POCT fingerstick with lispro sliding scale  Hemoglobin A1c 11.8  Add Lantus 20 units at bedtime.  Add lispro 5 unit in addition to sliding scale 3 times a day before meals    Generalized weakness  PT OT recommending SNF  Case management consult for placement    History of hypertension  Continue home medication with Coreg and  amlodipine    Bilateral lower extremity edema  Check ultrasound of the lower extremity    Obesity class III   Advise wt loss , life style modification       Analgesia: Tylenol standing dose, oxycodone as needed    Nutrition: Renal and carb consistent diet     DVT Prophylaxis: SCDs no heparin due to severe thrombocytopenia       Code Status: Full code    DISPO: SNF once medically stable at least 24 more hours         [place] states having severe left wrist pain and left hip pain after a fall denies chest pain or difficulty breathing denies nausea vomiting reports good appetite    MEDICATIONS     Current Facility-Administered Medications   Medication Dose Route Frequency    acetaminophen  1,000 mg Oral Q8H    amLODIPine  10 mg Oral Daily    aspirin  81 mg Oral Daily    carvedilol  25 mg Oral Q12H SCH    gabapentin  600 mg Oral Daily    insulin glargine  20 Units Subcutaneous QHS    insulin lispro  1-6 Units Subcutaneous QHS    insulin lispro  2-10 Units Subcutaneous TID AC    insulin lispro  5  Units Subcutaneous TID AC    lidocaine  1 patch Transdermal Q24H    sevelamer  800 mg Oral TID MEALS    vitamin C  500 mg Oral Daily       PHYSICAL EXAM     Vitals:    10/21/20 1800   BP: 100/53   Pulse: 65   Resp:    Temp:    SpO2:        Temperature: Temp  Min: 97.5 F (36.4 C)  Max: 98.6 F (37 C)  Pulse: Pulse  Min: 66  Max: 85  Respiratory: Resp  Min: 12  Max: 40  Non-Invasive BP: BP  Min: 116/59  Max: 177/70  Pulse Oximetry SpO2  Min: 96 %  Max: 100 %    Intake and Output Summary (Last 24 hours) at Date Time    Intake/Output Summary (Last 24 hours) at 10/21/2020 1520  Last data filed at 10/21/2020 1156  Gross per 24 hour   Intake --   Output 3650 ml   Net -3650 ml       GEN APPEARANCE: Normal;  A&OX3  HEENT: PERLA; EOMI; Conjunctiva Clear  NECK: Supple; No bruits  CVS: RRR, S1, S2; No M/G/R  LUNGS: CTAB; No Wheezes; No Rhonchi: No rales  ABD: Soft; No TTP; + Normoactive BS  EXT: Bilateral lower  extremity edema, left wrist tenderness and decreased range of motion  Skin exam:  pink  NEURO: CN 2-12 intact; No Focal neurological deficits  CAP REFILL:  Normal  MENTAL STATUS:  Normal    Exam done by Dillard Essex, MD on 10/21/2020 at 3:20 PM       LABS     Recent Labs   Lab 10/21/20  0353 10/20/20  1157   WBC 5.34 7.68   RBC 3.13* 3.27*   Hgb 9.3* 9.8*   Hematocrit 30.2* 31.2*   MCV 96.5* 95.4   Platelets 67* 65*       Recent Labs   Lab 10/21/20  0353 10/20/20  1157   Sodium 138 135   Potassium 4.2 4.7   Chloride 102 100   CO2 26 22   BUN 37.0* 64.0*   Creatinine 5.2* 8.0*   Glucose 515* 476*   Calcium 7.8* 7.9*   Magnesium  --  2.0       Recent Labs   Lab 10/21/20  0353 10/20/20  1157   ALT 32 41   AST (SGOT) 12 22   Bilirubin, Total 0.6 0.5   Albumin 2.4* 2.7*   Alkaline Phosphatase 151* 186*       Recent Labs   Lab 10/21/20  1306 10/21/20  0353 10/20/20  1157   Creatine Kinase (CK) 33  --   --    Troponin I  --  0.35* 0.42*             Microbiology Results (last 15 days)       Procedure Component Value Units Date/Time    COVID-19 (SARS-CoV-2) only ([facility]) asymptomatic admission - Hospitals [id number] Collected: 10/20/20 1318    Order Status: Completed Specimen: Nasopharyngeal Updated: 10/20/20 1351     Purpose of COVID testing Screening     SARS-CoV-2 Specimen Source Nasal Swab     SARS CoV 2 Overall Result Not Detected     Comment: __________________________________________________  -A result of "Detected" indicates POSITIVE for the    presence of SARS CoV-2 RNA  -A result of "Not Detected" indicates NEGATIVE for the  presence of SARS CoV-2 RNA  __________________________________________________________  Test performed using the Roche cobas [facility] SARS-CoV-2 assay. This assay is  only for use under the Food and Drug Administrations Emergency Use  Authorization. This is a real-time RT-PCR assay for the qualitative  detection of SARS-CoV-2 RNA. Viral nucleic acids may persist in vivo,  independent of  viability. Detection of viral nucleic acid does not imply the  presence of infectious virus, or that virus nucleic acid is the cause of  clinical symptoms. Negative results do not preclude SARS-CoV-2 infection and  should not be used as the sole basis for diagnosis, treatment or other  patient management decisions. Negative results must be combined with  clinical observations, patient history, and/or epidemiological information.  Invalid results may be due to inhibiting substances in the specimen and  recollection should occur. Please see Fact Sheets for patients and providers  located:  https://www.benson-chung.com/         Narrative:      o Collect and clearly label specimen type:  o PREFERRED-Upper respiratory specimen: One Nasal Swab in  Transport Media.  o Hand deliver to laboratory ASAP  Indication for testing->Extended care facility admission to  semi private room  Screening             RADIOLOGY     Upon my review:    Signed,  Dillard Essex, MD  3:20 PM 10/21/2020

## 2020-10-21 NOTE — Progress Notes (Signed)
[facility] PROGRESS NOTE  [id number]      Date Time: 10/21/20 6:14 AM  Patient Name: [name]  Attending Physician: Dillard Essex, MD    CC: follow-up ESRD    Assessment:     1. ESRD on HD MWF at [facility]    -History of noncompliance with treatments and had not been dialyzed in a week  2. Mild hyperkalemia: Improved  3. Metabolic acidosis due to ESRD  4. Anemia of CKD  5. HTN emergency: Improved with fluid removal  6. Volume Overload  7. Elevated Troponins  8. Resp Distress on 2L NC oxygen     Recommendations:     Dialysis again today  UF as tolerated    Case discussed with: Patient, RN    Armando Reichert, MD  [facility]  [id number] (office)      Subjective: Dialyzed without any problems yesterday.  3.8 L removed.  Remains on 2 L nasal cannula    Review of Systems:   Review of Systems -  no chest pain or abdominal pain      Physical Exam:     Vitals:    10/20/20 2349 10/21/20 0115 10/21/20 0153 10/21/20 0413   BP: 162/73   119/56   Pulse: 82   70   Resp:   18 18   Temp:   98.1 F (36.7 C) 98.6 F (37 C)   TempSrc:   Oral Oral   SpO2:   96% 96%   Weight:  108 kg (238 lb 1.6 oz)     Height:  1.575 m ('5\' 2"'$ )         Intake and Output Summary (Last 24 hours) at Date Time    Intake/Output Summary (Last 24 hours) at 10/21/2020 [id number]  Last data filed at 10/20/2020 1920  Gross per 24 hour   Intake --   Output 3300 ml   Net -3300 ml       General: awake, alert, oriented x 3, no acute distress.  Cardiovascular: regular rate and rhythm, no murmurs, rubs or gallops  Lungs: clear to auscultation bilaterally, without wheezing, rhonchi, or rales  Abdomen: soft, non-tender, non-distended  Extremities: 1+ edema      Meds:      Scheduled Meds: PRN Meds:    amLODIPine, 10 mg, Oral, Daily  aspirin, 81 mg, Oral, Daily  carvedilol, 25 mg, Oral, Q12H SCH  gabapentin, 600 mg, Oral, Daily  insulin lispro, 1-6 Units, Subcutaneous, QHS  insulin lispro, 2-10 Units, Subcutaneous, TID  AC  sevelamer, 800 mg, Oral, TID MEALS  vitamin C, 500 mg, Oral, Daily          Continuous Infusions:   acetaminophen, 650 mg, Q6H PRN  glucagon (rDNA), 1 mg, PRN   And  dextrose, 250 mL, PRN   And  dextrose, 25 g, PRN   And  dextrose, 25 g, PRN  hydrALAZINE, 10 mg, Q6H PRN  HYDROcodone-acetaminophen, 1 tablet, Q6H PRN  melatonin, 3 mg, QHS PRN  naloxone, 0.2 mg, PRN  potassium chloride, 0-40 mEq, PRN   And  potassium chloride, 10 mEq, PRN              Labs:     Recent Labs   Lab 10/21/20  0353 10/20/20  1157   WBC 5.34 7.68   Hgb 9.3* 9.8*   Hematocrit 30.2* 31.2*   Platelets 67* 65*     Recent Labs   Lab 10/21/20  0353 10/20/20  1157  Sodium 138 135   Potassium 4.2 4.7   Chloride 102 100   CO2 26 22   BUN 37.0* 64.0*   Creatinine 5.2* 8.0*   Calcium 7.8* 7.9*   Albumin 2.4* 2.7*   Phosphorus  --  5.4*   Magnesium  --  2.0   Glucose 515* 476*   EGFR 10.0 6.1             Invalid input(s): LEUKOCYTESUR        Imaging personally reviewed, including: XR Hip left 1 vw with pelvis    Result Date: 10/20/2020  No radiographically apparent displaced fracture is present. Please note that decreased bony mineralization can limit evaluation for nondisplaced fractures. Vernie Shanks, MD  10/20/2020 1:27 PM    XR Chest PA Or AP    Result Date: 10/20/2020  No significant interval change of diffuse interstitial markings, suggestive of pulmonary vascular congestion. Elnita Maxwell, MD  10/20/2020 1:19 PM    Forearm Complete Left    Result Date: 10/20/2020  1. No acute fracture. 2. Diffuse soft tissue swelling, nonspecific. 3. Diffuse osteopenia. 4. Peripheral vascular disease. Lonia Skinner, MD  10/20/2020 1:11 PM    Wrist Left PA Lateral and Oblique    Result Date: 10/20/2020  1. No acute fracture. 2. Diffuse soft tissue swelling, nonspecific. 3. Diffuse osteopenia. 4. Peripheral vascular disease. Lonia Skinner, MD  10/20/2020 1:11 PM         Signed by: Armando Reichert, MD

## 2020-10-21 NOTE — Plan of Care (Signed)
NURSING SHIFT NOTE     Patient: [name]  Day: 1      SHIFT EVENTS     Shift Narrative/Significant Events (PRN med administration, fall, RRT, etc.):     Pt admitted to unit 25. VSS. Tele verified. Morphine given x1. Pt sent to CT. Nih of 1    Safety and fall precautions remain in place. Purposeful rounding completed.          ASSESSMENT     Changes in assessment from patient's baseline this shift:    Neuro: Yes pt reports diminished sensation and pain in left hand  CV: No  Pulm: No  Peripheral Vascular: No  HEENT: No  GI: No  BM during shift: No, Last BM:    GU: No   Integ: No  MS: No    Pain: Yes  Pain Interventions: Medications  Medications Utilized: morphine    Mobility: PMP Activity: Step 3 - Bed Mobility of           FOUR EYES SKIN ASSESSMENT NOTE    [name]  [birth year]  [id number]    Braden Scale Score: 38    POC Initiated for Risk for Altered Skin Yes    Patient Assessed for Correct Mattress Surface Yes  *At risk patients with Braden Score less than 12 must be considered for specialty bed    Mepilex or Adhesive Foam Dressing applied to sacrum/heel if any PI risk factors present: No        Was skin checked with incoming RN/PCT? Yes    Blase Mess, RN  October 21, 2020  4:32 AM    Second RN/PCT Name:Sandra, Technician        Lines     Patient Lines/Drains/Airways Status       Active Lines, Drains and Airways       Name Placement date Placement time Site Days    Peripheral IV 10/20/20 20 G Right Wrist 10/20/20  1211  Wrist  less than 1    Graft/Fistula Left --  --  -- --                         VITAL SIGNS     Vitals:    10/21/20 0153   BP: 162/73   Pulse: 76   Resp: 18   Temp: 98.1 F (36.7 C)   SpO2: 96%       Temp  Min: 97.6 F (36.4 C)  Max: 99.3 F (37.4 C)  Pulse  Min: 75  Max: 88  Resp  Min: 12  Max: 40  BP  Min: 132/60  Max: 200/83  SpO2  Min: 89 %  Max: 100 %      Intake/Output Summary (Last 24 hours) at 10/21/2020 [id number]  Last data filed at 10/20/2020 1920  Gross per 24 hour    Intake --   Output 3300 ml   Net -3300 ml            OXYGEN WEANING     Stable to wean?: Yes  Attempted to wean?: Yes     Symptoms:     Oxygen saturation at rest maintained at:  95% at room air              CARE PLAN         Problem: Moderate/High Fall Risk Score >5  Goal: Patient will remain free of falls  Outcome: Progressing  Flowsheets (Taken 10/21/2020 0114)  High (Greater than 13):   HIGH-Initiate use of floor mats as appropriate   HIGH-Apply yellow "Fall Risk" arm band   HIGH-Bed alarm on at all times while patient in bed     Problem: Renal Instability  Goal: Fluid and electrolyte balance are achieved/maintained  Outcome: Progressing  Flowsheets (Taken 10/21/2020 0236)  Fluid and electrolyte balance are achieved/maintained:   Monitor intake and output every shift   Monitor daily weight   Monitor/assess lab values and report abnormal values   Provide adequate hydration

## 2020-10-21 NOTE — Progress Notes (Signed)
Uf of 2 liters. Bp soft during tx. Pt. Sleepy, but arousable after pain med given. Lungs clear. No sob noted. No edema. Telemetry SR. LAF worked well. Hemostasis achieved. Report called to Janith Lima RN   10/21/20 1902   Treatment Summary   Time Off Machine 1905   Duration of Treatment (Hours) 3.5   Treatment Type 2:1   Dialyzer Clearance Moderately streaked   Fluid Volume Off (mL) 2500   Prime Volume (mL) 200   Rinseback Volume (mL) 200   Fluid Given: Normal Saline (mL) 100   Fluid Given: PRBC  0 mL   Fluid Given: Albumin (mL) 0   Fluid Given: Other (mL) 0   Total Fluid Given 500   Hemodialysis Net Fluid Removed 2000   Post Treatment Assessment   Post-Treatment Weight (Kg) 102.9   Patient Response to Treatment TOLERATED FAIR. BP SOFT   Vitals   Temp 97.6 F (36.4 C)   Heart Rate 65   Resp Rate 18   BP 103/50   Assessment   Mental Status Lethargic;Oriented   Cardiac (WDL) WDL   Cardiac Regularity Regular   Cardiac Rhythm Normal Sinus Rhythm   Respiratory  WDL   Respiratory Pattern Regular;Easy   Bilateral Breath Sounds Clear   Cough Dry   Edema  WDL   General Skin Color Appropriate for ethnicity   Skin Condition/Temp Warm   Gastrointestinal (WDL) WDL   Abdomen Inspection Soft;Rounded   GI Symptoms None   Mobility Bed   Pain Assessment   Charting Type Reassessment   Pain Scale Used Numeric Scale (0-10)   Numeric Pain Scale   Pain Score 4   POSS Score 2   Pain Location Wrist   Pain Orientation Left   Pain Descriptors Aching;Sharp   Pain Frequency Intermittent;Increases with movement   Education   Person taught Patient   Knowledge basis Substantial   Topics taught Procedure;Medications   [place] Understanding   Bedside Nurse Communication   Name of bedside RN - post dialysis joshua haas rn

## 2020-10-21 NOTE — UM Notes (Addendum)
Admit to Inpatient (Order [phone]) on 10/20/20          PATIENT NAME: [name],[name]   DOB: 03/13/1955       CARE DAY 1 10/20/20   Reason for Admission a 65 y.o. female with history of ESRD on dialysis Monday Wednesday Friday, last dialysis last Friday, presenting with left hip and wrist/forearm pain status post trip and fall last night.  Denies LOC.  Complain of constant, ache to the dorsum of her left forearm as well as lateral left hip.  No relieving factors.  Reports she cannot get her dialysis today due to ongoing pain.  Reports shortness of breath .At the ED her BP was elevated at 200/83.  Review of blood work was significant for H/H of 9.8/31.2, platelets 65, troponin 0.42, BNP 2878, fingersticks 476.  EKG shows NSR with PAC and mild ST depression in V5 and V6       Temp:  [97.5 F (36.4 C)-98.6 F (37 C)] 97.5 F (36.4 C)  Heart Rate:  [68-85] 68  Resp Rate:  [12-40] 18  BP: (116-200)/(56-94) 116/59   O2 SAT 89-100% O2 2L   WT 100 kg     Scheduled Meds:  Current Facility-Administered Medications   Medication Dose Route Frequency    acetaminophen  1,000 mg Oral Q8H    amLODIPine  10 mg Oral Daily    aspirin  81 mg Oral Daily    carvedilol  25 mg Oral Q12H SCH    gabapentin  600 mg Oral Daily    insulin lispro  1-6 Units Subcutaneous QHS    insulin lispro  2-10 Units Subcutaneous TID AC    sevelamer  800 mg Oral TID MEALS    vitamin C  500 mg Oral Daily     Lasix '100mg'$  IV x1, Morphine '2mg'$  IV x 1     Abnormal Labs H/H 9.8/31.2, platelets 65, glucose 515, BUN 64, creat 8.0 , Alk Phos 186, calcium 7.9. troponin 0.42, 0.35, BNP 2, 876, albumin 2.4,     Imaging   EKG: EKG: NSR 90, no STE's or STD's, non-specific twave pattern,   CT HEAD: IMPRESSION:    No currently detectable acute intracranial hemorrhage or  mass effect or edema. Mildly suboptimal study for detection of small  amounts of hemorrhage due to multiple regions of beam hardening artifact  in the periphery of the frontal lobes  CXR: Pulmonary  vascular congestion     Exam: Respiratory:  Positive for shortness of breath, Right lower leg: Edema present.   Left lower leg: Edema present      Plan of Care:  Dyspnea  -Due to fluid overload in the setting of missed HD  -Chest x-ray suggestive of pulmonary vascular congestion  -BNP elevated at 2878  -Nephrology consulted and patient currently getting HD  -Reports significant improvement in shortness of breath  -Plan for repeat HD in a.m.  -Obtain 2D echo  -Telemetry monitoring  Elevated troponin  -Suspect demand ischemia  -Twelve-lead EKG showed minimal ST depression in lead V5-V6  -Patient free of chest pain  -Continue ASA  -Trend troponin  -2D echo  -May consider cardiology consult in a.m.  Hypertensive urgency  -Continue amlodipine and Coreg  -IV hydralazine as needed  Thrombocytopenia  -SCDs for DVT prophylaxis   Fall  -PT/OT evaluation, fall precaution  ESRD-DD  -MWF  Nephrology consult - for HD today       -----------------------------------------------------------------------------------------------------------------------    Care Day 2 10/21/20  Temperature: Temp  Min: 97.5 F (36.4 C)  Max: 98.6 F (37 C)  Pulse: Pulse  Min: 66  Max: 85  Respiratory: Resp  Min: 12  Max: 40  Non-Invasive BP: BP  Min: 116/59  Max: 177/70  Pulse Oximetry SpO2  Min: 96 %  Max: 100 %     Intake and Output Summary (Last 24 hours) at Date Time     Intake/Output Summary (Last 24 hours) at 10/21/2020 1520  Last data filed at 10/21/2020 1156      Gross per 24 hour   Intake --   Output 3650 ml   Net -3650 ml      Labs: Hgb 9.3, Hct 30.2, BUN 37, Creat 5.2, calcium 7.8, albumin 2.4, Alk Phos 151    Attending:  Shortness of breath and pulmonary edema   Chest x-ray with  interstitial markings suggestive of pulmonary vascular congestion.  likely due to missed dialysis  Status post dialysis on 10/20/2020 and will get another dialysis on 10/21/2020 appreciate nephrology consult   Elevated troponin possibly demand ischemia in the setting  of end-stage renal disease and pulmonary edema due to missed dialysis cannot rule out CAD.  Patient denied chest pain  Echocardiogram done on 07/21/2019 shows normal EF with 65 to 70% with mild mitral regurgitation small to moderate circumferential pericardial effusion  [place] [facility] non urgent consult line contacted for evaluation   ASA 81 mg daily    Severe left wrist pain with severe bilateral hip pain more on the left after a fall at home.  Rule out fracture  Reviewed all x-ray no evidence of fracture but shows osteopenia unable to rule out nondisplaced fracture we will get CT scan of the left wrist and forearm and CT scan bilateral hip to rule out fractures.   Thrombocytopenia  No signs of bleeding  Hematology consulted    Scheduled Meds:  Current Facility-Administered Medications   Medication Dose Route Frequency    acetaminophen  1,000 mg Oral Q8H    amLODIPine  10 mg Oral Daily    aspirin  81 mg Oral Daily    carvedilol  25 mg Oral Q12H Blanca    gabapentin  600 mg Oral Daily    insulin glargine  20 Units Subcutaneous QHS    insulin lispro  1-6 Units Subcutaneous QHS    insulin lispro  2-10 Units Subcutaneous TID AC    insulin lispro  5 Units Subcutaneous TID AC    lidocaine  1 patch Transdermal Q24H    sevelamer  800 mg Oral TID MEALS    vitamin C  500 mg Oral Daily     Continuous Infusions:  PRN Meds:.Nursing communication: Adult Hypoglycemia Treatment Algorithm **AND** glucagon (rDNA) **AND** dextrose **AND** dextrose **AND** dextrose, hydrALAZINE, melatonin, naloxone, oxyCODONE, sodium chloride, sodium chloride     UTILIZATION REVIEW CONTACT: Marica Otter  RN, BSN  Utilization Review   [facility]  [address] [place]  [place], [place] [address]  Phone: [phone] (Voice Mail Only)  Email: [email or web address]  Tax IDVM:[id number]         NOTES TO REVIEWER:    This clinical review is based on/compiled from documentation provided by the treatment team within  the patient's medical record.

## 2020-10-21 NOTE — Progress Notes (Signed)
MD stated that pt did not need Q 4 hour neuro checks and had been ruled out for stroke.

## 2020-10-21 NOTE — Plan of Care (Incomplete)
NURSING SHIFT NOTE     Patient: [name]  Day: 1      SHIFT EVENTS     Shift Narrative/Significant Events (PRN med administration, fall, RRT, etc.):     VSS. Pt sent down for doppler and CT scans. Roxicodone given x 1.    Safety and fall precautions remain in place. Purposeful rounding completed.          ASSESSMENT     Changes in assessment from patient's baseline this shift:    Neuro: No  CV: No  Pulm: No  Peripheral Vascular: No  HEENT: No  GI: No  BM during shift: No, Last BM:    GU: No   Integ: No  MS: No    Pain: No change  Pain Interventions: Medications  Medications Utilized: Norco    Mobility: PMP Activity: Step 3 - Bed Mobility of Distance Walked (ft) (Step 6,7): 0 Feet           Lines     Patient Lines/Drains/Airways Status       Active Lines, Drains and Airways       Name Placement date Placement time Site Days    Peripheral IV 10/20/20 20 G Right Wrist 10/20/20  1211  Wrist  1    External Urinary Catheter 10/21/20  1000  --  less than 1    Graft/Fistula Left --  --  -- --                         VITAL SIGNS     Vitals:    10/21/20 2226   BP: 116/53   Pulse: 73   Resp:    Temp:    SpO2:        Temp  Min: 97.5 F (36.4 C)  Max: 98.6 F (37 C)  Pulse  Min: 63  Max: 82  Resp  Min: 15  Max: 18  BP  Min: 98/50  Max: 162/73  SpO2  Min: 95 %  Max: 97 %      Intake/Output Summary (Last 24 hours) at 10/21/2020 2307  Last data filed at 10/21/2020 1902  Gross per 24 hour   Intake 600 ml   Output 2350 ml   Net -1750 ml              CARE PLAN         Problem: Moderate/High Fall Risk Score >5  Goal: Patient will remain free of falls  Outcome: Progressing  Flowsheets (Taken 10/21/2020 2300)  High (Greater than 13):   HIGH-Initiate use of floor mats as appropriate   HIGH-Apply yellow "Fall Risk" arm band   HIGH-Bed alarm on at all times while patient in bed     Problem: Renal Instability  Goal: Fluid and electrolyte balance are achieved/maintained  Outcome: Progressing  Flowsheets (Taken 10/21/2020 0236)  Fluid  and electrolyte balance are achieved/maintained:   Monitor intake and output every shift   Monitor daily weight   Monitor/assess lab values and report abnormal values   Provide adequate hydration     Problem: Pain  Goal: Pain at adequate level as identified by patient  Outcome: Progressing  Flowsheets (Taken 10/21/2020 2305)  Pain at adequate level as identified by patient:   Identify patient comfort function goal   Reassess pain within 30-60 minutes of any procedure/intervention, per Pain Assessment, Intervention, Reassessment (AIR) Cycle     Problem: Every Day - Stroke  Goal: Core/Quality measure  requirements - Daily  Outcome: Progressing  Flowsheets (Taken 10/21/2020 2305)  Core/Quality measure requirements - Daily: Continue stroke education (must include Modifiable Risk Factors, Warning Signs and Symptoms of Stroke, Activation of Emergency Medical System and Follow-up Appointments). Ensure handout has been given and documented.  Goal: Neurological status is stable or improving  Outcome: Progressing  Flowsheets (Taken 10/21/2020 2305)  Neurological status is stable or improving: Monitor/assess/document neurological assessment (Stroke: every 4 hours)  Goal: Stable vital signs and fluid balance  Outcome: Progressing  Flowsheets (Taken 10/21/2020 2305)  Stable vital signs and fluid balance:   Position patient for maximum circulation/cardiac output   Monitor and assess vitals every 4 hours or as ordered and hemodynamic parameters

## 2020-10-21 NOTE — Plan of Care (Signed)
NURSING SHIFT NOTE     Patient: [name]  Day: 1      SHIFT EVENTS     Shift Narrative/Significant Events (PRN med administration, fall, RRT, etc.):     Pt A&Ox4, on room air, NSR on tele, and stable at this time.    Q 4 hr neuro checks remained at baseline.    Pain complained about pain throughout shift and treated with ice pack and medications.    Ring removed due to swelling of finger. Ring placed in dentures cup with earring.    Safety and fall precautions remain in place. Purposeful rounding completed.          ASSESSMENT     Changes in assessment from patient's baseline this shift:    Neuro: No  CV: No  Pulm: No  Peripheral Vascular: No  HEENT: No  GI: No  BM during shift: No, Last BM:    GU: No   Integ: No  MS: No    Pain: No change  Pain Interventions: Medications, Rest, and ice  Medications Utilized: Hydrocodone, tylenol and ice packs    Mobility: PMP Activity: Step 4 - Dangle at Bedside of Distance Walked (ft) (Step 6,7): 0 Feet           Lines     Patient Lines/Drains/Airways Status       Active Lines, Drains and Airways       Name Placement date Placement time Site Days    Peripheral IV 10/20/20 20 G Right Wrist 10/20/20  1211  Wrist  1    External Urinary Catheter 10/21/20  1000  --  less than 1    Graft/Fistula Left --  --  -- --                         VITAL SIGNS     Vitals:    10/21/20 2003   BP: 121/71   Pulse: 65   Resp: 18   Temp: 97.7 F (36.5 C)   SpO2: 95%       Temp  Min: 97.5 F (36.4 C)  Max: 98.6 F (37 C)  Pulse  Min: 63  Max: 84  Resp  Min: 12  Max: 20  BP  Min: 98/50  Max: 162/73  SpO2  Min: 95 %  Max: 100 %      Intake/Output Summary (Last 24 hours) at 10/21/2020 2057  Last data filed at 10/21/2020 1902  Gross per 24 hour   Intake 600 ml   Output 2350 ml   Net -1750 ml            CARE PLAN   FOUR EYES SKIN ASSESSMENT NOTE    [name]  September 21, 1955  [id number]    Braden Scale Score: 58    POC Initiated for Risk for Altered Skin Yes    Patient Assessed for Correct Mattress  Surface Yes  *At risk patients with Braden Score less than 12 must be considered for specialty bed    Mepilex or Adhesive Foam Dressing applied to sacrum/heel if any PI risk factors present: No        If Wound/Pressure Injury present:  Wound/PI assessment documented in LDA (will be shown below): No    Admitting physician notified: No    Wound consult ordered: No        Was skin checked with incoming RN/PCT? Yes    Janith Lima, RN  October 21, 2020      Second RN/PCT Name: Fre                           Problem: Moderate/High Fall Risk Score >5  Goal: Patient will remain free of falls  Outcome: Progressing     Problem: Renal Instability  Goal: Fluid and electrolyte balance are achieved/maintained  Outcome: Progressing  Flowsheets (Taken 10/21/2020 0236 by Blase Mess, RN)  Fluid and electrolyte balance are achieved/maintained:   Monitor intake and output every shift   Monitor daily weight   Monitor/assess lab values and report abnormal values   Provide adequate hydration     Problem: Patient Receiving Advanced Renal Therapies  Goal: Therapy access site remains intact  Outcome: Progressing  Flowsheets (Taken 10/20/2020 1549 by Erin Hearing, RN)  Therapy access site remains intact: Assess therapy access site     Problem: Safety  Goal: Patient will be free from injury during hospitalization  Outcome: Progressing  Flowsheets (Taken 10/21/2020 2053)  Patient will be free from injury during hospitalization:   Hourly rounding   Assess patient's risk for falls and implement fall prevention plan of care per policy  Goal: Patient will be free from infection during hospitalization  Outcome: Progressing     Problem: Pain  Goal: Pain at adequate level as identified by patient  Outcome: Progressing  Flowsheets (Taken 10/21/2020 2053)  Pain at adequate level as identified by patient:   Identify patient comfort function goal   Reassess pain within 30-60 minutes of any procedure/intervention, per Pain Assessment, Intervention,  Reassessment (AIR) Cycle     Problem: Side Effects from Pain Analgesia  Goal: Patient will experience minimal side effects of analgesic therapy  Outcome: Progressing     Problem: Discharge Barriers  Goal: Patient will be discharged home or other facility with appropriate resources  Outcome: Progressing     Problem: Psychosocial and Spiritual Needs  Goal: Demonstrates ability to cope with hospitalization/illness  Outcome: Progressing     Problem: Compromised Tissue integrity  Goal: Damaged tissue is healing and protected  Outcome: Progressing  Goal: Nutritional status is improving  Outcome: Progressing     Problem: Day of Admission - Stroke  Goal: Core/Quality measure requirements - Admission  Outcome: Completed  Flowsheets (Taken 10/21/2020 0421 by Blase Mess, RN)  Core/Quality measure requirements - Admission:   Document NIH Stroke Scale on admission   Document nursing swallow/dysphagia screen on admission. If patient fails, keep patient NPO (follow your hospital protocol on swallowing screening).   VTE Prevention: Ensure anticoagulant(s) administered and/or anti-embolism stockings/devices documented as ordered   Ensure lipid panel ordered   Begin stroke education on admission (must include Modifiable Risk Factors, Warning Signs and Symptoms of Stroke, Activation of Emergency Medical System and Follow-up Appointments) Ensure handout has been given and documented.   Ensure PT/OT and/or SLP ordered   Ensure antithrombotic administered or contraindication documented by LIP     Problem: Every Day - Stroke  Goal: Core/Quality measure requirements - Daily  Outcome: Progressing  Flowsheets (Taken 10/21/2020 2053)  Core/Quality measure requirements - Daily: Continue stroke education (must include Modifiable Risk Factors, Warning Signs and Symptoms of Stroke, Activation of Emergency Medical System and Follow-up Appointments). Ensure handout has been given and documented.  Goal: Neurological status is stable or  improving  Outcome: Progressing  Flowsheets (Taken 10/21/2020 2053)  Neurological status is stable or improving: Monitor/assess/document neurological assessment (Stroke: every 4 hours)  Goal:  Stable vital signs and fluid balance  Outcome: Progressing  Flowsheets (Taken 10/21/2020 2053)  Stable vital signs and fluid balance: Monitor and assess vitals every 4 hours or as ordered and hemodynamic parameters  Goal: Patient will maintain adequate oxygenation  Outcome: Progressing  Flowsheets (Taken 10/21/2020 2053)  Patient will maintain adequate oxygenation: Maintain SpO2 of greater than 92%  Goal: Patient's risk of aspiration will be minimized  Outcome: Progressing  Flowsheets (Taken 10/21/2020 2053)  Patient's risk of aspiration will be minimized: Assess and monitor ability to swallow  Goal: Nutritional intake is adequate  Outcome: Progressing  Flowsheets (Taken 10/21/2020 2053)  Nutritional intake is adequate: Allow adequate time for meals  Goal: Elimination patterns are normal or improving  Outcome: Progressing  Goal: Mobility/Activity is maintained at optimal level for patient  Outcome: Not Progressing  Flowsheets (Taken 10/21/2020 2053)  Mobility/activity is maintained at optimal level for patient: Reposition patient every 2 hours and as needed unless able to reposition self  Goal: Skin integrity is maintained or improved  Outcome: Progressing  Flowsheets (Taken 10/21/2020 2053)  Skin integrity is maintained or improved:   Assess Braden Scale every shift   Turn or reposition patient every 2 hours or as needed unless able to reposition self   Keep skin clean and dry  Goal: Neurovascular status is stable or improving  Outcome: Progressing  Flowsheets (Taken 10/21/2020 2053)  Neurovascular status is stable or improving: Monitor/assess neurovascular status (pulses, capillary refill, pain, paresthesia, presence of edema)  Goal: Effective coping demonstrated  Outcome: Progressing  Flowsheets (Taken 10/21/2020 2053)  Effective  coping demonstrated: Offer reassurance to decrease anxiety  Goal: Will be able to express needs and understand communication  Outcome: Progressing  Flowsheets (Taken 10/21/2020 2053)  Able to express needs and understand communication: Include patient care companion in decisions related to communication     Problem: Day of Discharge - Stroke  Goal: Able to express understanding of discharge instructions and stroke education  Outcome: Progressing  [place] (Taken 10/21/2020 2053)  Able to express understanding of discharge instructions and stroke education: Provide alternative method of communication if needed  Goal: Core/Quality measures - Discharge  Outcome: Progressing  Flowsheets (Taken 10/21/2020 2053)  Core/Quality measures - Discharge: Document discharge NIH Stroke Scale

## 2020-10-21 NOTE — PT Eval Note (Signed)
[facility]  Physical Therapy Evaluation and Treatment    Patient: [name]  MRN#: [id number]  Unit: [facility]  Bed: A2514/A2514-01    Time of Evaluation and Treatment:  Time Calculation  PT Received On: 10/21/20  Start Time: 0904  Stop Time: 0930  Time Calculation (min): 26 min    Evaluation Time: 10 minutes  Treatment Time: 16 minutes    Chart Review and Collaboration with Care Team: 8 minutes, not included in above time    PT Visit Number: 1    Consult received for [name] for PT Evaluation and Treatment.  Patients medical condition is appropriate for Physical therapy intervention at this time.    ___________________________________________________    POST ACUTE CARE THERAPY RECOMMENDATIONS:   Discharge Recommendation: SNF  If recommended discharge destination is unavailable, patient will require the DME noted below and the following assistance: Home health aide, Total assist, 24 hour supervision, and Home health therapy    DME Recommended for Discharge: [facility], Hospital bed      ACUTE CARE THERAPY RECOMMENDATIONS:  Pt would benefit from Physical Therapy to address deficits and increase functional independence with functional mobility, ambulation, and balance to prepare for safe d/c.     Is an Occupational Therapy Evaluation Indicated at this time? Yes, an acute care OT evaluation is indicated at this time and should be performed prior to hospital discharge to assist with ADL DME recommendations and/or D/C placement recommendations.      (Therapy recommendations are subject to change with patient status.  Please refer to the most recent PT/OT note for up-to-date recommendations.)  ___________________________________________________      Activity Orders:  PT eval and treat    Precautions and Contraindications:  Precautions  Weight Bearing Status: no restrictions  Fall Risks: High  Other Precautions: Contact Isolation, NO BP LUE    Personal Protective Equipment  (PPE)  gloves, procedure gown, and procedure mask    Medical Diagnosis:  Shortness of breath [R06.02]  Hypoxia [R09.02]  ESRD (end stage renal disease) [N18.6]    History of Present Illness:  [name] is a 65 y.o. female admitted on 10/20/2020 with "PMHx of hypertension, hyperlipidemia, diabetes, OSA not compliant with CPAP, ESRD-DD referred to the ED from dialysis center for evaluation of fall and left hip pain.  Patient reports that she fell 10/19/2020 while trying to get out of bed.  She struck the left side of her body and her head but did not lose consciousness.  She was able to get herself up and walk despite experiencing constant sharp left hip pain shoulder pain.  She also noticed puffiness of her face and increased shortness of breath since this morning.  Missed her HD session last Monday.  She has not been wearing her CPAP in a long time.  She denies chest pain, palpitation, orthopnea, PND.  At the ED her BP was elevated at 200/83.  Review of blood work was significant for H/H of 9.8/31.2, platelets 65, troponin 0.42, BNP 2878, fingersticks 476.  EKG shows NSR with PAC and mild ST depression in V5 and V6"    Patient Active Problem List   Diagnosis    Iron deficiency anemia secondary to inadequate dietary iron intake    Sideropenic dysphagia    Peritoneal dialysis catheter dysfunction, initial encounter    Peritonitis    Upper GI bleed    ESRD (end stage renal disease) on dialysis    Generalized abdominal pain  Thrombocytopenia    h/o orthostatic hypotension    Type 2 diabetes mellitus, with long-term current use of insulin    GERD (gastroesophageal reflux disease)    Bowel incontinence    Right sided numbness    Uncontrolled type 2 diabetes mellitus with hyperglycemia    Hypertension    Hyperlipidemia    History of COPD    Gastritis with hemorrhage    History of anemia due to chronic kidney disease    Open wound, abdominal wall, lateral, subsequent encounter    Gout    Dizziness    Fall     History of CVA (cerebrovascular accident)    Pneumonia due to COVID-19 virus    Diarrhea    Hypertensive urgency    Shortness of breath       Past Medical/Surgical History:  Past Medical History:   Diagnosis Date    Chronic obstructive pulmonary disease     Diabetes mellitus     Hyperlipidemia     Hypertension     Sleep apnea 2018     Past Surgical History:   Procedure Laterality Date    EGD, BIOPSY N/A 05/14/2019    Procedure: EGD, BIOPSY;  Surgeon: Ronie Spies, MD;  Location: [facility];  Service: Gastroenterology;  Laterality: N/A;    EXPLORATORY LAPAROTOMY N/A 05/27/2019    Procedure: EXPLORATORY LAPAROTOMY;  Surgeon: Herbert Moors., MD;  Location: [facility];  Service: General;  Laterality: N/A;    JOINT REPLACEMENT      shoulder    JOINT REPLACEMENT      knee    OVARY SURGERY Left     Removal    REMOVAL, FOREIGN BODY N/A 05/27/2019    Procedure: REMOVAL, PERITONEAL DIALYSIS CATHETER;  Surgeon: Herbert Moors., MD;  Location: [facility];  Service: General;  Laterality: N/A;    TUNNELED CATH CHECK/CHANGE (PERMCATH) N/A 07/03/2019    Procedure: TUNNELED CATH CHECK/CHANGE;  Surgeon: Maureen Ralphs, MD;  Location: [facility];  Service: Interventional Radiology;  Laterality: N/A;    TUNNELED CATH PLACEMENT (PERMCATH) Bilateral 05/23/2019    Procedure: TUNNELED CATH PLACEMENT;  Surgeon: Jacques Earthly, MD;  Location: [facility];  Service: Interventional Radiology;  Laterality: Bilateral;    TUNNELED CATH PLACEMENT (PERMCATH) Right 07/31/2019    Procedure: TUNNELED CATH PLACEMENT;  Surgeon: Jacques Earthly, MD;  Location: [facility];  Service: Interventional Radiology;  Laterality: Right;       X-Rays/Tests/Labs:  Lab Results   Component Value Date/Time    HGB 9.3 (L) 10/21/2020 03:53 AM    HCT 30.2 (L) 10/21/2020 03:53 AM    K 4.2 10/21/2020 03:53 AM    NA 138 10/21/2020 03:53 AM    INR 1.1 07/01/2019 10:41 PM    TROPI 0.35 (HH) 10/21/2020 03:53 AM    TROPI 0.42 (HH) 10/20/2020 11:57 AM     TROPI 0.02 01/21/2020 03:19 AM    TROPI 0.06 (H) 01/19/2020 10:02 PM    TROPI 0.01 03/18/2006 06:10 PM       All imaging reviewed, please see chart for details.    Social History:  Prior Level of Function  Prior level of function: Ambulates with assistive device, Independent with ADLs  Assistive Device: Single point cane  Baseline Activity Level: Community ambulation  Driving: does not drive  Cooking: No (Nurse completes cooking for patient)  Employment: Disabled  DME Currently at Home: ADL- Education officer, environmental, ADL- Civil engineer, contracting, [place], [place], CPAP/BIPAP, ADL- 3-in-1 Bedside  Commode, Other (Comment) (TENS unit)    Home Living Arrangements  Living Arrangements: Alone  Type of Home: Apartment  Home Layout: One level, Elevator  Bathroom Shower/Tub: Research scientist (physical sciences): Grab bars in shower, Civil engineer, contracting, Toilet riser  DME Currently at Home: ADL- Education officer, environmental, ADL- Civil engineer, contracting, [place], Single Point, CPAP/BIPAP, ADL- 3-in-1 Bedside Commode, Other (Comment) (TENS unit)  Home Living - Notes / Comments: Nurse 7 days/wk, 3pm-11pm. Nurse completes cleaning and cooking per patient, she completes all other ADLs      Subjective:  Patient is agreeable to participation in the therapy session. Nursing clears patient for therapy.  Patient Goal: reduce pain. "Find out what is wrong with my L arm."  Pain Assessment  Pain Assessment: Numeric Scale (0-10)  Pain Score: 10-severe pain  POSS Score: Awake and Alert  Pain Location: Arm;Hand;Wrist  Pain Orientation: Left  Pain Descriptors: Throbbing  Pain Frequency: Constant/continuous;Increases with movement  Effect of Pain on Daily Activities: severe  Pain Intervention(s): Medication (See eMAR);Repositioned;Emotional support      Objective:  Observation of Patient/Vital Signs:  Vitals:    10/21/20 0757   BP: 131/72   Pulse: 69   Resp: 15   Temp: 98.1 F (36.7 C)   SpO2: 96%     Supine BP: 134/81    Inspection/Posture: pt supine in bed, pillow supporting L  forearm/wrist    Cognitive Status and Neuro Exam:  Cognition/Neuro Status  Arousal/Alertness: Appropriate responses to stimuli  Attention Span: Appears intact  Orientation Level: Oriented X4  Memory: Appears intact  Following Commands: Follows all commands and directions without difficulty  Safety Awareness: minimal verbal instruction  Insights: Fully aware of deficits  Problem Solving: Able to problem solve independently  Behavior: calm;cooperative    Musculoskeletal Examination  Gross ROM  Right Lower Extremity ROM: within functional limits  Left Lower Extremity ROM:  (DEC 25%)  Gross Strength  Right Lower Extremity Strength: 2+/5  Left Lower Extremity Strength: 2+/5       Functional Mobility:  Functional Mobility  Supine to Sit: Minimal Assist;to Left;HOB raised;using bedrail;Increased Time;Increased Effort  Scooting to HOB: Moderate Assist;Increased Time;Increased Effort  Scooting to EOB: Contact Guard Assist;Increased Time;Increased Effort  Sit to Supine: Moderate Assist;to Right  Sit to Stand: Unable to assess (Comment) (due to extreme pain in L wrist, and pt refusing to try)  Stand to Sit: Unable to assess (Comment) (due to extreme pain in L wrist, and pt refusing to try)  Transfers  Bed to Chair: Unable to assess (Comment) (due to extreme pain in L wrist, and pt refusing to try)  Locomotion  Ambulation: Unable to assess (Comment) (due to extreme pain in L wrist, and pt refusing to try)     Balance  Balance  Sitting - Static: Good  Sitting - Dynamic: Fair    Participation and Activity Tolerance  Participation and Endurance  Participation Effort: fair  Endurance: Tolerates 10 - 20 min exercise with multiple rests  [place] Dyspnea Scale: 1+ Dyspnea    Educated the Patient to role of physical therapy, plan of care, goals of therapy and safety with mobility and ADLs, discharge instructions, home safety with verbalized understanding .    Patient left in bed with alarm and all other medical equipment in  place and call bell and all personal items/needs within reach.  RN notified of session outcome. Notified CM team and attending of d/c recommendation via secure chat.  Assessment:  [name] is a 65 y.o. female admitted 10/20/2020.  PT Assessment  Assessment: Decreased LE ROM;Decreased LE strength;Decreased endurance/activity tolerance;Decreased balance;Decreased functional mobility  Prognosis: Good;With continued PT status post acute discharge  Progress: Progressing toward goals      Treatment:  TE: pt performed supine SLR x 10 BLE, hip ABD x 10 BLE, heel slides x 10 BLE, and ankle pumps x 10 BLE.     Mobility: pt required min to mod assistance w/ all bed mobility due to pain and not applying any weight through LUE from pain. Pt refused to attempt standing or ambulating due to pain in LUE and weakness of LLE. Pt educated on benefit of mobility and ambulation.       Plan:  Treatment/Interventions: Exercise, Gait training, Neuromuscular re-education, Functional transfer training, LE strengthening/ROM, Endurance training, Patient/family training, Equipment eval/education, Bed mobility, Compensatory technique education, Continued evaluation  PT Frequency: 2-3x/wk  Risks/Benefits/POC Discussed with Pt/Family: With patient    PMP Activity: Step 4 - Dangle at Bedside  Distance Walked (ft) (Step 6,7): 0 Feet      Goals:  Goals  Goal Formulation: With patient  Time for Goal Acheivement: By time of discharge  Pt Will Go Supine To Sit: with supervision, to maximize functional mobility and independence  Pt Will Perform Sit to Stand: with stand by assist, to maximize functional mobility and independence  Pt Will Transfer Bed/Chair: with single point cane, with stand by assist, to maximize functional mobility and independence  Pt Will Ambulate: 101-150 feet, with single point cane, with stand by assist, to maximize functional mobility and independence    Curt Jews, PT, DPT    Physical Medicine and  [facility]   [phone]    10/21/2020  9:43 AM

## 2020-10-21 NOTE — Progress Notes (Signed)
Arrived via bed. Pt. Alert and oriented x3. Lungs clear. No sob noted. Face without puffiness today. Slight lower leg edema. Good bruit and thrill in LAF. Pt. Continues to c/o pain in left lower arm when moved. To have CT scan after tx. Telemetry SR. Report received from Janith Lima RN   10/21/20 1528   Dialysis Weight   Pre-Treatment Weight (Kg) 104.9   Scale Type ICU Bed Scale   Vitals   Temp 97.6 F (36.4 C)   Heart Rate 66   Resp Rate 18   BP 114/58   Assessment   Mental Status Alert;Oriented   Cardiac (WDL) WDL   Cardiac Regularity Regular   Cardiac Rhythm Normal Sinus Rhythm   Respiratory  WDL   Respiratory Pattern Regular;Easy   Bilateral Breath Sounds Diminished;Clear   Cough Dry;Spontaneous   Edema  X   RLE Edema +1   LLE Edema +1   General Skin Color Appropriate for ethnicity   Skin Condition/Temp Warm   Gastrointestinal (WDL) WDL   Abdomen Inspection Soft;Rounded   GI Symptoms None   Mobility Bed   Pain Assessment   Charting Type Assessment   Pain Scale Used Numeric Scale (0-10)   Numeric Pain Scale   Pain Score 7   POSS Score 1   Pain Location Arm   Pain Orientation Left   Pain Descriptors Sharp   Pain Frequency Intermittent;Increases with movement   Hemodialysis Comments   Pre-Hemodialysis Comments TIME OUDT DONE

## 2020-10-21 NOTE — Plan of Care (Signed)
Problem: Day of Admission - Stroke  Goal: Core/Quality measure requirements - Admission  Outcome: Progressing  Flowsheets (Taken 10/21/2020 0421)  Core/Quality measure requirements - Admission:   Document NIH Stroke Scale on admission   Document nursing swallow/dysphagia screen on admission. If patient fails, keep patient NPO (follow your hospital protocol on swallowing screening).   VTE Prevention: Ensure anticoagulant(s) administered and/or anti-embolism stockings/devices documented as ordered   Ensure lipid panel ordered   Begin stroke education on admission (must include Modifiable Risk Factors, Warning Signs and Symptoms of Stroke, Activation of Emergency Medical System and Follow-up Appointments) Ensure handout has been given and documented.   Ensure PT/OT and/or SLP ordered   Ensure antithrombotic administered or contraindication documented by LIP

## 2020-10-21 NOTE — Progress Notes (Signed)
1117 CM sent referrals to SNFs in [employer].    1525 CM attempted to see pt at bedside. Pt off-unit for dialysis.    1653 CM message assigned OT to try eval again later or to prioritize for am to support auth needs.

## 2020-10-22 ENCOUNTER — Other Ambulatory Visit (INDEPENDENT_AMBULATORY_CARE_PROVIDER_SITE_OTHER): Payer: Self-pay | Admitting: Residents

## 2020-10-22 ENCOUNTER — Inpatient Hospital Stay: Payer: 59

## 2020-10-22 DIAGNOSIS — Z992 Dependence on renal dialysis: Secondary | ICD-10-CM

## 2020-10-22 DIAGNOSIS — N186 End stage renal disease: Secondary | ICD-10-CM

## 2020-10-22 DIAGNOSIS — I10 Essential (primary) hypertension: Secondary | ICD-10-CM

## 2020-10-22 DIAGNOSIS — R0602 Shortness of breath: Secondary | ICD-10-CM

## 2020-10-22 DIAGNOSIS — R778 Other specified abnormalities of plasma proteins: Secondary | ICD-10-CM

## 2020-10-22 DIAGNOSIS — W19XXXA Unspecified fall, initial encounter: Secondary | ICD-10-CM

## 2020-10-22 DIAGNOSIS — E782 Mixed hyperlipidemia: Secondary | ICD-10-CM

## 2020-10-22 LAB — GLUCOSE WHOLE BLOOD - POCT
Whole Blood Glucose POCT: 197 mg/dL — ABNORMAL HIGH (ref 70–100)
Whole Blood Glucose POCT: 269 mg/dL — ABNORMAL HIGH (ref 70–100)
Whole Blood Glucose POCT: 196 mg/dL — ABNORMAL HIGH (ref 70–100)
Whole Blood Glucose POCT: 269 mg/dL — ABNORMAL HIGH (ref 70–100)

## 2020-10-22 LAB — CBC AND DIFFERENTIAL
Absolute NRBC: 0 10*3/uL (ref 0.00–0.00)
Basophils Absolute Automated: 0.02 10*3/uL (ref 0.00–0.08)
Basophils Automated: 0.4 %
Eosinophils Absolute Automated: 0.08 10*3/uL (ref 0.00–0.44)
Eosinophils Automated: 1.7 %
Hematocrit: 32.2 % — ABNORMAL LOW (ref 34.7–43.7)
Hgb: 9.7 g/dL — ABNORMAL LOW (ref 11.4–14.8)
Immature Granulocytes Absolute: 0.01 10*3/uL (ref 0.00–0.07)
Immature Granulocytes: 0.2 %
Lymphocytes Absolute Automated: 1.37 10*3/uL (ref 0.42–3.22)
Lymphocytes Automated: 29.2 %
MCH: 29.9 pg (ref 25.1–33.5)
MCHC: 30.1 g/dL — ABNORMAL LOW (ref 31.5–35.8)
MCV: 99.4 fL — ABNORMAL HIGH (ref 78.0–96.0)
MPV: 12.8 fL — ABNORMAL HIGH (ref 8.9–12.5)
Monocytes Absolute Automated: 0.58 10*3/uL (ref 0.21–0.85)
Monocytes: 12.4 %
Neutrophils Absolute: 2.63 10*3/uL (ref 1.10–6.33)
Neutrophils: 56.1 %
Nucleated RBC: 0 /100 WBC (ref 0.0–0.0)
Platelets: 79 10*3/uL — ABNORMAL LOW (ref 142–346)
RBC: 3.24 10*6/uL — ABNORMAL LOW (ref 3.90–5.10)
RDW: 13 % (ref 11–15)
WBC: 4.69 10*3/uL (ref 3.10–9.50)

## 2020-10-22 LAB — ECG 12-LEAD
Atrial Rate: 90 {beats}/min
P Axis: 38 degrees
P-R Interval: 146 ms
Q-T Interval: 386 ms
QRS Duration: 74 ms
QTC Calculation (Bezet): 472 ms
R Axis: 49 degrees
T Axis: 133 degrees
Ventricular Rate: 90 {beats}/min

## 2020-10-22 LAB — BASIC METABOLIC PANEL
Anion Gap: 10 (ref 5.0–15.0)
BUN: 31 mg/dL — ABNORMAL HIGH (ref 7.0–21.0)
CO2: 26 mEq/L (ref 17–29)
Calcium: 8.3 mg/dL — ABNORMAL LOW (ref 8.5–10.5)
Chloride: 103 mEq/L (ref 99–111)
Creatinine: 4.3 mg/dL — ABNORMAL HIGH (ref 0.4–1.0)
Glucose: 218 mg/dL — ABNORMAL HIGH (ref 70–100)
Potassium: 4 mEq/L (ref 3.5–5.3)
Sodium: 139 mEq/L (ref 135–145)

## 2020-10-22 LAB — GFR: EGFR: 12.5

## 2020-10-22 LAB — IRON PROFILE
Iron Saturation: 25 % (ref 15–50)
Iron: 41 ug/dL (ref 40–145)
TIBC: 166 ug/dL — ABNORMAL LOW (ref 265–497)
UIBC: 125 ug/dL — ABNORMAL LOW (ref 126–382)

## 2020-10-22 LAB — HEPATITIS C ANTIBODY: Hepatitis C, AB: NONREACTIVE

## 2020-10-22 LAB — FOLATE: Folate: 7.2 ng/mL

## 2020-10-22 LAB — LACTATE DEHYDROGENASE: LDH: 114 U/L — ABNORMAL LOW (ref 120–331)

## 2020-10-22 LAB — VITAMIN B12: Vitamin B-12: 674 pg/mL (ref 211–911)

## 2020-10-22 LAB — HIV-1/2 AG/AB 4TH GEN. W/ REFLEX: HIV Ag/Ab, 4th Generation: NONREACTIVE

## 2020-10-22 LAB — HEMOLYSIS INDEX: Hemolysis Index: 4 Index (ref 0–24)

## 2020-10-22 MED ORDER — ATORVASTATIN CALCIUM 40 MG PO TABS
40.0000 mg | ORAL_TABLET | Freq: Every evening | ORAL | Status: DC
Start: 2020-10-22 — End: 2020-10-25
  Administered 2020-10-22 – 2020-10-24 (×3): 40 mg via ORAL
  Filled 2020-10-22 (×3): qty 1

## 2020-10-22 MED ORDER — SODIUM CHLORIDE 0.9 % IV BOLUS
100.0000 mL | INTRAVENOUS | Status: AC | PRN
Start: 2020-10-22 — End: 2020-10-22

## 2020-10-22 MED ORDER — PREDNISONE 20 MG PO TABS
20.0000 mg | ORAL_TABLET | Freq: Every morning | ORAL | Status: DC
Start: 2020-10-22 — End: 2020-10-23
  Administered 2020-10-22 – 2020-10-23 (×2): 20 mg via ORAL
  Filled 2020-10-22 (×2): qty 1

## 2020-10-22 MED ORDER — SODIUM CHLORIDE 0.9 % IV BOLUS
250.0000 mL | INTRAVENOUS | Status: AC | PRN
Start: 2020-10-22 — End: 2020-10-22

## 2020-10-22 NOTE — Progress Notes (Signed)
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 difficile colitis  Elevated ALK phos  Elevated troponin  Hypocalcemia- improving  Severe left wrist pain with severe bilateral hip pain- after a fall at home- No fracture on recent imagaing        Plan:  Pending ANA/ Markers for hemolysis  Platelets are above threshold of intervention  [place] abd shows normal spleen size- consistent with ITP  Monitor H/H and Platelets  Iron replete. Pending ferritin/Retic- continue iron repletion with dialysis and ESA therapy  Neurology and Cardiology consults appreciated  Started on Prednisone 20 mg daily x 5 days for wrist pain per Neph- may cause platelets to improve  Continue home meds for comorbid conditions         Fredric Mare, MD    10/22/2020  2:46 PM       SUBJECTIVE:   C/o severe left wrist pain and left hip pain after a fall at home  Denies cp or sob  No nausea vomiting    Iron 41, Tsat 25%, Folate 7.2. B12 674  LDH low at 114  HIV neg  Hep B/C - neg    9/29- [place] BLE doppler - Normal  9/29- CT Bony Pelvis - no CT evidence for fracture or dislocation of pelvis or either hip.  9/29- CT Left Forearm/wrist - no evidence of fracture or dislocation   9/30- [place] Abd- Hepatomegaly. No evidence of splenomegaly     OBJECTIVE       Visit Vitals  BP 115/57   Pulse 67   Temp 98.2 F (36.8 C) (Oral)   Resp 18   Ht 1.575 m (5\' 2" )   Wt 108.2 kg (238 lb 8.6 oz)   LMP  (LMP Unknown)   SpO2 98%   BMI 43.63 kg/m       Body mass index is 43.63  kg/m.      Intake/Output Summary (Last 24 hours) at 10/22/2020 1446  Last data filed at 10/22/2020 0751  Gross per 24 hour   Intake 0 ml   Output 2000 ml   Net -2000 ml        REVIEW OF SYSTEMS     Per narrative, 2 system review was done.       PHYSICAL EXAM     General: Obese  Skin: no rashes or lesions.  Eye: PERRL, EOMI  HENT: normocephalic, moist oral mucosa, no scleral icterus  Neck: supple, no lymphadenopathy.  Lungs: non-labored respiration.  Heart: normal rate, regular rhythm, no murmur.  Abdomen: soft, non-tender, no masses.  Musculoskeletal: Tenderness over the dorsum of her left distal forearm and entire left lateral upper leg. no swelling.  Lymphatic: B/L lower leg edema, no clubbing   Neurologic: awake, alert, and oriented X3, non-focal exam.     MEDICATIONS      Scheduled Meds: PRN Meds:    acetaminophen, 1,000 mg, Oral, Q8H  amLODIPine, 10 mg, Oral, Daily  aspirin, 81 mg, Oral, Daily  atorvastatin, 40 mg, Oral, QHS  carvedilol, 25 mg, Oral, Q12H SCH  gabapentin, 600 mg, Oral, Daily  insulin glargine, 20 Units, Subcutaneous, QHS  insulin lispro, 1-6 Units, Subcutaneous, QHS  insulin lispro, 2-10 Units, Subcutaneous, TID AC  insulin lispro, 5 Units, Subcutaneous, TID AC  lidocaine, 1 patch, Transdermal, Q24H  predniSONE, 20 mg, Oral, QAM W/BREAKFAST  sevelamer, 800 mg, Oral, TID MEALS  vitamin C, 500 mg, Oral, Daily         glucagon (rDNA), 1 mg, PRN   And  dextrose, 250 mL, PRN   And  dextrose, 25 g, PRN   And  dextrose, 25 g, PRN  hydrALAZINE, 10 mg, Q6H PRN  melatonin, 3 mg, QHS PRN  naloxone, 0.2 mg, PRN  oxyCODONE, 5 mg, Q4H PRN           Home Meds:      Home Medications       Med List Status: Complete Set By: Tanda Rockers, RN at 10/20/2020  9:48 PM      Status Comment        10/20/2020  9:49 PM     Pt reports taking other medication, but unsure of which              amLODIPine (NORVASC) 10 MG tablet     Take 1 tablet (10 mg total) by mouth daily     aspirin 81 MG chewable tablet     Chew 81 mg  by mouth daily     carvedilol (COREG) 25 MG tablet     Take 1 tablet by mouth daily     gabapentin (NEURONTIN) 600 MG tablet     Take 1 tablet by mouth daily     sevelamer (RENVELA) 800 MG tablet     Take 800 mg by mouth 3 (three) times daily with meals     vitamin C (ASCORBIC ACID) 500 MG tablet     Take 1 tablet (500 mg total) by mouth daily                                                                   Flagged for Removal               atorvastatin (LIPITOR) 40 MG tablet     Take 40 mg by mouth daily                 I personally reviewed all of the medications       LABS     Recent Labs   Lab 10/22/20  0652 10/21/20  0353 10/20/20  1157   Glucose 218* 515* 476*   BUN 31.0* 37.0* 64.0*   Creatinine 4.3* 5.2* 8.0*   EGFR 12.5 10.0 6.1   Calcium 8.3* 7.8* 7.9*   Sodium 139 138 135   Potassium 4.0 4.2 4.7   Chloride 103 102 100   CO2 26 26 22    Albumin  --  2.4* 2.7*   Phosphorus  --   --  5.4*   Magnesium  --   --  2.0   AST (SGOT)  --  12 22   ALT  --  32 41   Bilirubin, Total  --  0.6 0.5   Alkaline  Phosphatase  --  151* 186*     Recent Labs   Lab 10/22/20  0652 10/21/20  0353 10/20/20  1157   WBC 4.69 5.34 7.68   Hgb 9.7* 9.3* 9.8*   Hematocrit 32.2* 30.2* 31.2*   MCV 99.4* 96.5* 95.4   MCH 29.9 29.7 30.0   MCHC 30.1* 30.8* 31.4*   Platelets 79* 67* 65*     Band Neutrophils Absolute   Date Value Ref Range Status   01/05/2020 0.00 0.00 - 1.00 x10 3/uL Final       Iron   Date Value Ref Range Status   10/22/2020 41 40 - 145 ug/dL Final   54/09/8117 50 40 - 145 ug/dL Final     TIBC   Date Value Ref Range Status   10/22/2020 166 (L) 265 - 497 ug/dL Final   14/78/2956 213 (L) 265 - 497 ug/dL Final     Ferritin   Date Value Ref Range Status   12/31/2019 3,158.40 (H) 4.60 - 204.00 ng/mL Final   05/13/2019 1,070.90 (H) 4.60 - 204.00 ng/mL Final     Folate   Date Value Ref Range Status   10/22/2020 7.2 See below ng/mL Final     Comment:     Deficient    : <3.5 ng/mL  Intermediate : 3.5 - 5.4 ng/mL  Normal       :  Greater Than 5.4 ng/mL     01/05/2020 8.3 See below ng/mL Final     Comment:     Deficient    : <3.5 ng/mL  Intermediate : 3.5 - 5.4 ng/mL  Normal       : Greater Than 5.4 ng/mL       Vitamin B-12   Date Value Ref Range Status   10/22/2020 674 211 - 911 pg/mL Final   01/05/2020 850 211 - 911 pg/mL Final     LDH   Date Value Ref Range Status   10/22/2020 114 (L) 120 - 331 U/L Final       No results for input(s): PTT, PT, INR in the last 72 hours.        Microbiology:  Microbiology Results (last 15 days)       Procedure Component Value Units Date/Time    COVID-19 (SARS-CoV-2) only ([facility]) asymptomatic admission - Hospitals [[id number]] Collected: 10/20/20 1318    Order Status: Completed Specimen: Nasopharyngeal Updated: 10/20/20 1351     Purpose of COVID testing Screening     SARS-CoV-2 Specimen Source Nasal Swab     SARS CoV 2 Overall Result Not Detected     Comment: __________________________________________________  -A result of "Detected" indicates POSITIVE for the    presence of SARS CoV-2 RNA  -A result of "Not Detected" indicates NEGATIVE for the    presence of SARS CoV-2 RNA  __________________________________________________________  Test performed using the Roche cobas [facility] SARS-CoV-2 assay. This assay is  only for use under the Food and Drug Administration's Emergency Use  Authorization. This is a real-time RT-PCR assay for the qualitative  detection of SARS-CoV-2 RNA. Viral nucleic acids may persist in vivo,  independent of viability. Detection of viral nucleic acid does not imply the  presence of infectious virus, or that virus nucleic acid is the cause of  clinical symptoms. Negative results do not preclude SARS-CoV-2 infection and  should not be used as the sole basis for diagnosis, treatment or other  patient management decisions. Negative results must be combined with  clinical observations, patient  history, and/or epidemiological information.  Invalid results may be due to inhibiting substances  in the specimen and  recollection should occur. Please see Fact Sheets for patients and providers  located:  WirelessDSLBlog.no         Narrative:      o Collect and clearly label specimen type:  o PREFERRED-Upper respiratory specimen: One Nasal Swab in  Transport Media.  o Hand deliver to laboratory ASAP  Indication for testing->Extended care facility admission to  semi private room  Screening             IMAGING     Diagnostic Imaging:  XR Hip left 1 vw with pelvis    Result Date: 10/20/2020  No radiographically apparent displaced fracture is present. Please note that decreased bony mineralization can limit evaluation for nondisplaced fractures. Sharyn Blitz, MD  10/20/2020 1:27 PM    XR Chest PA Or AP    Result Date: 10/20/2020  No significant interval change of diffuse interstitial markings, suggestive of pulmonary vascular congestion. Rozann Lesches, MD  10/20/2020 1:19 PM    Forearm Complete Left    Result Date: 10/20/2020  1. No acute fracture. 2. Diffuse soft tissue swelling, nonspecific. 3. Diffuse osteopenia. 4. Peripheral vascular disease. Gustavus Messing, MD  10/20/2020 1:11 PM    Wrist Left PA Lateral and Oblique    Result Date: 10/20/2020  1. No acute fracture. 2. Diffuse soft tissue swelling, nonspecific. 3. Diffuse osteopenia. 4. Peripheral vascular disease. Gustavus Messing, MD  10/20/2020 1:11 PM    CT Head WO Contrast    Result Date: 10/21/2020   No currently detectable acute intracranial hemorrhage or mass effect or edema. Mildly suboptimal study for detection of small amounts of hemorrhage due to multiple regions of beam hardening artifact in the periphery of the frontal lobes.. Note: Note that CT scanning at this site  utilizes multiple dose reduction techniques including automatic exposure control, adjustment of the MAA and/or KVP according to patient's size and use of iterative reconstruction technique Laurena Slimmer, MD  10/21/2020 8:16 AM    US Abdomen Complete    Result Date: 10/22/2020    1. Hepatomegaly. No evidence of splenomegaly. Aldean Ast, MD  10/22/2020 10:02 AM    CT Boney Pelvis    Result Date: 10/22/2020   No CT evidence for fracture or dislocation of the pelvis or either hip. Sandie Ano, MD  10/22/2020 7:44 AM    CT Forearm Left WO Contrast    Result Date: 10/22/2020   No gross CT evidence for fracture or dislocation of the left wrist, radius, or ulna within study limitations as described above. However, nondisplaced fractures cannot be excluded. If there is persistent clinical concern for wrist or elbow fracture, further evaluation with MRI is recommended Sandie Ano, MD  10/22/2020 7:38 AM    CT Wrist Left WO Contrast    Result Date: 10/22/2020   No gross CT evidence for fracture or dislocation of the left wrist, radius, or ulna within study limitations as described above. However, nondisplaced fractures cannot be excluded. If there is persistent clinical concern for wrist or elbow fracture, further evaluation with MRI is recommended Sandie Ano, MD  10/22/2020 7:38 AM    US Venous Low Extrem Duplx Dopp Comp Bilat    Result Date: 10/22/2020   NORMAL VENOUS DUPLEX OF THE LOWER EXTREMITIES. NO EVIDENCE OF INTRALUMINAL THROMBUS OR OBSTRUCTION TO VENOUS FLOW IN RIGHT OR LEFT LOWER EXTREMITY. Wyvonne Lenz, MD  10/22/2020 12:35 PM

## 2020-10-22 NOTE — Progress Notes (Signed)
0800  Pt reporting total numbness of left wrist and hand when RN touches hand and wrist. Pt does feel pain on LUE when pressed upon. NIH remained at 1 due to left wrist numbness and BLE reduced sensation, which pt reports to be at baseline, and numbness of left wrist.  MD notified and RN continuing to monitor and perform Q 4 hr neuro checks.    1600  Pt reports sensation is improving and is able to move fingers and lift arm more so than prior shift.

## 2020-10-22 NOTE — Progress Notes (Signed)
Hospitalist Progress note       Patient: [name]  Date: 10/22/2020   LOS: 2 Days  Admission Date: 10/20/2020   MRN: [id number]  Attending: Dillard Essex, MD  Please contact me on epic chat        ASSESSMENT/PLAN     [name] is a 65 y.o. female admitted with Shortness of breath    Interval Summary:     10/22/2020 plans for MRI left wrist to due to severity of the pain high suspicion for fracture  Check CT cervical spine  Right hand numbness likely due to trauma discussed with neurology    Patient Active Hospital Problem List:    Shortness of breath and pulmonary edema   Chest x-ray with  interstitial markings suggestive of pulmonary vascular congestion.  likely due to missed dialysis  Status post dialysis on 10/20/2020 , 10/21/2020  and 9/30 appreciate nephrology consult    Elevated troponin possibly demand ischemia in the setting of end-stage renal disease and pulmonary edema due to missed dialysis   Ruled out ACS  Patient denied chest pain  Echocardiogram done on 07/21/2019 shows normal EF with 65 to 70% with mild mitral regurgitation small to moderate circumferential pericardial effusion  [facility] consult greatly appreciated no further testing required follow-up as an outpatient  ASA 81 mg daily     Severe left wrist pain with severe bilateral hip pain more on the left after a fall at home.  Rule out fracture  Reviewed all x-ray no evidence of fracture but shows osteopenia unable to rule out nondisplaced fracture   CT scan of the left wrist no evidence of a fracture  Check MRI of the left wrist  Start on small dose of prednisone 20 mg daily    Thrombocytopenia  No signs of bleeding  Hematology consulted  Check HIV, hep C    Diabetes mellitus  Type II uncontrolled   Continue POCT fingerstick with lispro sliding scale  Hemoglobin A1c 11.8  Continue Lantus 20 units at bedtime.  Continue lispro 5 unit in addition to sliding scale 3 times a day before meals    Generalized weakness  PT OT  recommending SNF  Case management consult for placement    History of hypertension  Continue home medication with Coreg and amlodipine    Bilateral lower extremity edema  Check ultrasound of the lower extremity    Obesity class III   Advise wt loss , life style modification       Analgesia: Tylenol standing dose, oxycodone as needed    Nutrition: Renal and carb consistent diet     DVT Prophylaxis: SCDs no heparin due to severe thrombocytopenia       Code Status: Full code    DISPO: SNF once medically stable at least 24 more hours         [place] states having severe left wrist pain and left hip pain after a fall denies chest pain or difficulty breathing denies nausea vomiting reports good appetite    MEDICATIONS     Current Facility-Administered Medications   Medication Dose Route Frequency    acetaminophen  1,000 mg Oral Q8H    amLODIPine  10 mg Oral Daily    aspirin  81 mg Oral Daily    atorvastatin  40 mg Oral QHS    carvedilol  25 mg Oral Q12H SCH    gabapentin  600 mg Oral Daily  insulin glargine  20 Units Subcutaneous QHS    insulin lispro  1-6 Units Subcutaneous QHS    insulin lispro  2-10 Units Subcutaneous TID AC    insulin lispro  5 Units Subcutaneous TID AC    lidocaine  1 patch Transdermal Q24H    predniSONE  20 mg Oral QAM W/BREAKFAST    sevelamer  800 mg Oral TID MEALS    vitamin C  500 mg Oral Daily       PHYSICAL EXAM     Vitals:    10/22/20 1209   BP: 115/57   Pulse: 67   Resp: 18   Temp: 98.2 F (36.8 C)   SpO2: 98%       Temperature: Temp  Min: 97.6 F (36.4 C)  Max: 98.4 F (36.9 C)  Pulse: Pulse  Min: 62  Max: 73  Respiratory: Resp  Min: 16  Max: 20  Non-Invasive BP: BP  Min: 94/61  Max: 125/67  Pulse Oximetry SpO2  Min: 92 %  Max: 98 %    Intake and Output Summary (Last 24 hours) at Date Time    Intake/Output Summary (Last 24 hours) at 10/22/2020 1454  Last data filed at 10/22/2020 0751  Gross per 24 hour   Intake 0 ml   Output 2000 ml   Net -2000 ml         GEN  APPEARANCE: Normal;  A&OX3  HEENT: PERLA; EOMI; Conjunctiva Clear  NECK: Supple; No bruits  CVS: RRR, S1, S2; No M/G/R  LUNGS: CTAB; No Wheezes; No Rhonchi: No rales  ABD: Soft; No TTP; + Normoactive BS  EXT: Bilateral lower extremity edema, left wrist tenderness and decreased range of motion  Skin exam:  pink  NEURO: CN 2-12 intact; No Focal neurological deficits  CAP REFILL:  Normal  MENTAL STATUS:  Normal    Exam done by Dillard Essex, MD on 10/22/2020 at 2:54 PM       LABS     Recent Labs   Lab 10/22/20  0652 10/21/20  0353 10/20/20  1157   WBC 4.69 5.34 7.68   RBC 3.24* 3.13* 3.27*   Hgb 9.7* 9.3* 9.8*   Hematocrit 32.2* 30.2* 31.2*   MCV 99.4* 96.5* 95.4   Platelets 79* 67* 65*         Recent Labs   Lab 10/22/20  0652 10/21/20  0353 10/20/20  1157   Sodium 139 138 135   Potassium 4.0 4.2 4.7   Chloride 103 102 100   CO2 '26 26 22   '$ BUN 31.0* 37.0* 64.0*   Creatinine 4.3* 5.2* 8.0*   Glucose 218* 515* 476*   Calcium 8.3* 7.8* 7.9*   Magnesium  --   --  2.0         Recent Labs   Lab 10/21/20  0353 10/20/20  1157   ALT 32 41   AST (SGOT) 12 22   Bilirubin, Total 0.6 0.5   Albumin 2.4* 2.7*   Alkaline Phosphatase 151* 186*         Recent Labs   Lab 10/21/20  1306 10/21/20  0353 10/20/20  1157   Creatine Kinase (CK) 33  --   --    Troponin I  --  0.35* 0.42*               Microbiology Results (last 15 days)       Procedure Component Value Units Date/Time    COVID-19 (SARS-CoV-2) only ([facility])  asymptomatic admission - Hospitals [id number] Collected: 10/20/20 1318    Order Status: Completed Specimen: Nasopharyngeal Updated: 10/20/20 1351     Purpose of COVID testing Screening     SARS-CoV-2 Specimen Source Nasal Swab     SARS CoV 2 Overall Result Not Detected     Comment: __________________________________________________  -A result of "Detected" indicates POSITIVE for the    presence of SARS CoV-2 RNA  -A result of "Not Detected" indicates NEGATIVE for the    presence of SARS CoV-2  RNA  __________________________________________________________  Test performed using the Roche cobas [facility] SARS-CoV-2 assay. This assay is  only for use under the Food and Drug Administration's Emergency Use  Authorization. This is a real-time RT-PCR assay for the qualitative  detection of SARS-CoV-2 RNA. Viral nucleic acids may persist in vivo,  independent of viability. Detection of viral nucleic acid does not imply the  presence of infectious virus, or that virus nucleic acid is the cause of  clinical symptoms. Negative results do not preclude SARS-CoV-2 infection and  should not be used as the sole basis for diagnosis, treatment or other  patient management decisions. Negative results must be combined with  clinical observations, patient history, and/or epidemiological information.  Invalid results may be due to inhibiting substances in the specimen and  recollection should occur. Please see Fact Sheets for patients and providers  located:  https://www.benson-chung.com/         Narrative:      o Collect and clearly label specimen type:  o PREFERRED-Upper respiratory specimen: One Nasal Swab in  Transport Media.  o Hand deliver to laboratory ASAP  Indication for testing->Extended care facility admission to  semi private room  Screening             RADIOLOGY     Upon my review:    Signed,  Dillard Essex, MD  2:54 PM 10/22/2020

## 2020-10-22 NOTE — Progress Notes (Signed)
Completed 3.5 hours of HD. 3 L UF tolerated well. Patient taken down to CT scan post HD. Report given to [employer] RN.     10/22/20 1930   Treatment Summary   Time Off Machine 1920   Duration of Treatment (Hours) 3.5   Treatment Type 2:1   Dialyzer Clearance Moderately streaked   Fluid Volume Off (mL) 3500   Prime Volume (mL) 200   Rinseback Volume (mL) 200   Fluid Given: Normal Saline (mL) 100   Fluid Given: PRBC  0 mL   Fluid Given: Albumin (mL) 0   Fluid Given: Other (mL) 0   Total Fluid Given 500   Hemodialysis Net Fluid Removed 3000   Post Treatment Assessment   Post-Treatment Weight (Kg) 105.2   Patient Response to Treatment toleated well   Vitals   Temp 98.2 F (36.8 C)   Heart Rate 66   Resp Rate 20   BP 108/54   SpO2 98 %   O2 Device None (Room air)   Assessment   Mental Status Alert;Oriented;Cooperative   Cardiac (WDL) WDL   Cardiac Regularity Regular   Cardiac Rhythm Normal Sinus Rhythm   Respiratory  WDL   Respiratory Pattern Regular   Bilateral Breath Sounds Clear   Edema  WDL   Generalized Edema Non Pitting Edema   Facial Non Pitting Edema   Sacral UTA   RLE Edema Non Pitting Edema   LLE Edema Non Pitting Edema   General Skin Color Appropriate for ethnicity   Skin Condition/Temp Warm;Dry   Gastrointestinal (WDL) WDL   Abdomen Inspection Soft;Nondistended   GI Symptoms None   Mobility Bed   Pain Assessment   Charting Type Assessment   Pain Scale Used Numeric Scale (0-10)   Numeric Pain Scale   Pain Score 0   POSS Score 1   Education   Person taught Patient   Knowledge basis Substantial   Topics taught Access care;Procedure;Fluid Management   Teaching Tools Explain   Reponse Verbalizes Understanding   Bedside Nurse Communication   Name of bedside RN - post dialysis Blase Mess

## 2020-10-22 NOTE — OT Eval Note (Signed)
[facility]  Occupational Therapy Evaluation and Treatment    Patient: [name]  MRN#: [id number]   Unit: [facility]  Bed: A2514/A2514-01    Time of Evaluation and Treatment:  Time Calculation  OT Received On: 10/22/20  Start Time: 1003  Stop Time: 1026  Time Calculation (min): 23 min    Chart Review and Collaboration with Care Team: 5 minutes, not included in above time.    Evaluation: 15 minutes  Treatment:  8 minutes    OT Visit Number: 1    Consult received for [name] for OT Evaluation and Treatment.  Patient's medical condition is appropriate for Occupational therapy intervention at this time.    ___________________________________________________    POST ACUTE CARE THERAPY RECOMMENDATIONS:   Discharge Recommendation: SNF  If recommended discharge destination is unavailable, patient will require the DME noted below and the following assistance: Home health aide, Supervision, Assistance with OOB activity, Assist with I/ADLs, and Home health therapy    DME Recommended for Discharge: No additional equipment/DME recommended at this time, Patient already has needed equipment      ACUTE CARE THERAPY RECOMMENDATIONS:  Patient would benefit from continued skilled OT to address deficits listed below and increase functional independence.     Is PT evaluation indicated at this time? This patient is already on PT caseload.      (Therapy recommendations are subject to change with patient status.  Please refer to the most recent OT/PT note for up-to-date recommendations.)  ___________________________________________________      Activity Orders:  OT eval and treat and progressive mobility protocol    Precautions and Contraindications:  Precautions  Weight Bearing Status: no restrictions (avoided WB LUE 2/2 pain)  Fall Risks: High  Other Precautions: Contact Isolation, NO BP LUE, falls    Personal Protective Equipment (PPE)  gloves, procedure gown, procedure mask, and eye  shield/covering    Medical Diagnosis:  Shortness of breath [R06.02]  Hypoxia [R09.02]  ESRD (end stage renal disease) [N18.6]    History of Present Illness:  [name] is a 65 y.o. female admitted on 10/20/2020 with "PMHx of hypertension, hyperlipidemia, diabetes, OSA not compliant with CPAP, ESRD-DD referred to the ED from dialysis center for evaluation of fall and left hip pain.  Patient reports that she fell 10/19/2020 while trying to get out of bed.  She struck the left side of her body and her head but did not lose consciousness.  She was able to get herself up and walk despite experiencing constant sharp left hip pain shoulder pain.  She also noticed puffiness of her face and increased shortness of breath since this morning.  Missed her HD session last Monday.  She has not been wearing her CPAP in a long time.  She denies chest pain, palpitation, orthopnea, PND.  At the ED her BP was elevated at 200/83.  Review of blood work was significant for H/H of 9.8/31.2, platelets 65, troponin 0.42, BNP 2878, fingersticks 476.  EKG shows NSR with PAC and mild ST depression in V5 and V6"    Patient Active Problem List   Diagnosis    Iron deficiency anemia secondary to inadequate dietary iron intake    Sideropenic dysphagia    Peritoneal dialysis catheter dysfunction, initial encounter    Peritonitis    Upper GI bleed    ESRD (end stage renal disease) on dialysis    Generalized abdominal pain    Thrombocytopenia    h/o orthostatic  hypotension    Type 2 diabetes mellitus, with long-term current use of insulin    GERD (gastroesophageal reflux disease)    Bowel incontinence    Right sided numbness    Uncontrolled type 2 diabetes mellitus with hyperglycemia    Hypertension    Hyperlipidemia    History of COPD    Gastritis with hemorrhage    History of anemia due to chronic kidney disease    Open wound, abdominal wall, lateral, subsequent encounter    Gout    Dizziness    Fall    History of CVA (cerebrovascular  accident)    Pneumonia due to COVID-19 virus    Diarrhea    Hypertensive urgency    Shortness of breath        Past Medical/Surgical History:  Past Medical History:   Diagnosis Date    Chronic obstructive pulmonary disease     Diabetes mellitus     Hyperlipidemia     Hypertension     Sleep apnea 2018     Past Surgical History:   Procedure Laterality Date    EGD, BIOPSY N/A 05/14/2019    Procedure: EGD, BIOPSY;  Surgeon: Ronie Spies, MD;  Location: [facility];  Service: Gastroenterology;  Laterality: N/A;    EXPLORATORY LAPAROTOMY N/A 05/27/2019    Procedure: EXPLORATORY LAPAROTOMY;  Surgeon: Herbert Moors., MD;  Location: [facility];  Service: General;  Laterality: N/A;    JOINT REPLACEMENT      shoulder    JOINT REPLACEMENT      knee    OVARY SURGERY Left     Removal    REMOVAL, FOREIGN BODY N/A 05/27/2019    Procedure: REMOVAL, PERITONEAL DIALYSIS CATHETER;  Surgeon: Herbert Moors., MD;  Location: [facility];  Service: General;  Laterality: N/A;    TUNNELED CATH CHECK/CHANGE (PERMCATH) N/A 07/03/2019    Procedure: TUNNELED CATH CHECK/CHANGE;  Surgeon: Maureen Ralphs, MD;  Location: [facility];  Service: Interventional Radiology;  Laterality: N/A;    TUNNELED CATH PLACEMENT (PERMCATH) Bilateral 05/23/2019    Procedure: TUNNELED CATH PLACEMENT;  Surgeon: Jacques Earthly, MD;  Location: [facility];  Service: Interventional Radiology;  Laterality: Bilateral;    TUNNELED CATH PLACEMENT (PERMCATH) Right 07/31/2019    Procedure: TUNNELED CATH PLACEMENT;  Surgeon: Jacques Earthly, MD;  Location: [facility];  Service: Interventional Radiology;  Laterality: Right;        X-Rays/Tests/Labs  Lab Results   Component Value Date/Time    HGB 9.7 (L) 10/22/2020 06:52 AM    HCT 32.2 (L) 10/22/2020 06:52 AM    K 4.0 10/22/2020 06:52 AM    NA 139 10/22/2020 06:52 AM    INR 1.1 07/01/2019 10:41 PM    TROPI 0.35 (HH) 10/21/2020 03:53 AM    TROPI 0.42 (HH) 10/20/2020 11:57 AM    TROPI 0.02 01/21/2020 03:19 AM     TROPI 0.06 (H) 01/19/2020 10:02 PM    TROPI 0.01 03/18/2006 06:10 PM       All imaging reviewed. Please see chart for details      Social History:  Prior Level of Function  Prior level of function: Ambulates with assistive device, Independent with ADLs  Assistive Device: Single point cane  Baseline Activity Level: Community ambulation  Driving: does not drive  Cooking: No (Nurse completes cooking for patient)  Employment: Disabled  DME Currently at Home: ADL- Education officer, environmental, ADL- Civil engineer, contracting, [place], [place], CPAP/BIPAP, ADL- 3-in-1 Bedside Commode, Other (  Comment), Gilford Rile, [employer] Living Arrangements  Living Arrangements: Alone  Type of Home: Apartment  Home Layout: One level, Elevator  Bathroom Shower/Tub: Research scientist (physical sciences): Grab bars in shower, Civil engineer, contracting, Toilet riser  DME Currently at Home: ADL- Education officer, environmental, ADL- Civil engineer, contracting, [place], Single Point, [employer], ADL- 3-in-1 Bedside Commode, Other (Comment), Environmental consultant, [place] - Notes / Comments: Nurse 7 days/wk, 3pm-11pm. Nurse completes cleaning and cooking per patient, she completes all other ADLs      Subjective:      Patient is agreeable to participation in the therapy session. Nursing clears patient for therapy.   Patient Goal: To go home  Pain Assessment  Pain Assessment: Numeric Scale (0-10)  Pain Score:  (does not quantify)  POSS Score: Awake and Alert  Pain Location: Arm, Hand, Wrist  Pain Orientation: Left  Pain Frequency: Increases with movement  Pain Intervention(s): Medication (See eMAR) (Pt with personal wrist brace in place)      Objective:     Observation of Patient/Vital Signs:VSS    Cognitive Status and Neuro Exam:  Cognition/Neuro Status  Arousal/Alertness: Appropriate responses to stimuli  Attention Span: Appears intact  Orientation Level: Oriented to place, Oriented to situation, Oriented to person  Memory: Appears intact  Following Commands: independent  Safety Awareness:  minimal verbal instruction  Behavior: calm, cooperative  Motor Planning: intact    Musculoskeletal Examination  Gross ROM  Right Upper Extremity ROM: within functional limits  Left Upper Extremity ROM: needs focused assessment  LUE ROM - Hand - % Reduced: reduced by 50% (and wrist)  Gross Strength  Right Upper Extremity Strength: within functional limits  Left Upper Extremity Strength:  (hand, wrist limited 2/2 pain, shoulder WFL)       Sensory/Oculomotor Examination  Sensory  Auditory: intact         Activities of Daily Living  Self-care and Home Management  Eating: Setup, in a chair  LB Dressing: Moderate Assist (don briefs over hips on L side)  Toileting: Dependent (Pt reports incontinence, briefs in place)    Functional Mobility:  Mobility and Transfers  Scooting to EOB: Supervision  Supine to Sit: HOB raised, Contact Guard Assist  Sit to Stand: Stand by Assist  Bed to Chair: Minimal Assist (w/ SPC)     Balance  Balance  Static Sitting Balance: good  Dyanamic Sitting Balance: good  Static Standing Balance: fair (w/ AD)  Dynamic Standing Balance: poor (w/ AD)    Participation and Activity Tolerance  Participation and Endurance  Participation Effort: good  Endurance: Tolerates 10 - 20 min exercise with multiple rests    Educated the Patient to role of occupational therapy, plan of care, goals of therapy and safety with mobility and ADLs, discharge instructions with verbalized understanding .    Patient left in bedside chair with alarm and all other medical equipment in place and call bell and all personal items/needs within reach.  RN notified of session outcome. CM team and attending have been previously notified of d/c recommendation; no updates to d/c recommendation since that time.      Assessment:  [name] is a 65 y.o. female admitted 10/20/2020. OT Assessment: decreased ROM;decreased strength;balance deficits;decreased independence with ADLs;decreased independence with IADLs;decreased  endurance/activity tolerance      Treatment:  - Pt obs NWB LUE throughout 2/2 pain, personal brace in place.  - Facilitated sit>stand from EOB x2 trials w/ SPC  and SBA.  - Pt with significant LOB following first standing trial, posterior lean req MOD A to support. Returned to seated and vitals monitored and remain WNL.  - Progresses to MIN steadying A with SPC EOB>chair with instruction for pacing and safety in stand>sit with good carry over.  - Discussed [place] rec, POC and encouraged cont OOB as tolerated with clin staff A. Pt verbalizes understanding, however states strong preference for return to home w/ caregiver A.    Rehabilitation Potential: Prognosis: Good, With continued OT s/p acute discharge    Plan:  OT Frequency Recommended: 2-3x/wk   Treatment Interventions: ADL retraining, Functional transfer training, UE strengthening/ROM, Endurance training, Patient/Family training, Equipment eval/education, Compensatory technique education     PMP Activity: Step 5 - Chair  Distance Walked (ft) (Step 6,7): 2 Feet    Risks/benefits/POC discussed    Goals:  Time For Goal Achievement: by time of discharge  ADL Goals  Patient will dress lower body: Supervision, Not met  Mobility and Transfer Goals  Pt will transfer bed to chair: Supervision, Not met (w/ LRAD)  Neuro Re-Ed Goals  Pt will perform dynamic standing balance: Supervision, for 5 minutes, to complete standing ADLs safely, Not met                        Barnetta Chapel, [place], OTR/L [phone]  10/22/2020 11:07 AM

## 2020-10-22 NOTE — Consults (Signed)
NEUROLOGY CONSULTATION    Date Time: 10/22/20 11:01 AM  Patient Name: [name]  Attending Physician: Dillard Essex, MD      Assessment & Plan:   Pt primarily has edema and local pain due to trauma.  Cannot exclude fracture at the wrist but CTs are negative.  Suspected minor radial nerve involvement cause mild posterior hand numbness - this is likely from trauma or edema.  Doubt cervical spine lesion but given her fall and reduced sensation left arm - will order CT cervical spine.    History of Present Illness:   65 yo female with ESRD, OSA, COPD, DM, HLD, HTN who apparently had a fall out of bed on 9/27 striking her left leg and arm on the floor with residual pain including L wrist, arm, shoulder, and left hip. She also c/o neck pain when asked about this.  She is mainly c/o pain but also mentioned some numbness on the back of her left hand and hand weakness with edema.    Past Medical History:     Past Medical History:   Diagnosis Date    Chronic obstructive pulmonary disease     Diabetes mellitus     Hyperlipidemia     Hypertension     Sleep apnea 2018       Meds:      Scheduled Meds: PRN Meds:    acetaminophen, 1,000 mg, Oral, Q8H  amLODIPine, 10 mg, Oral, Daily  aspirin, 81 mg, Oral, Daily  carvedilol, 25 mg, Oral, Q12H SCH  gabapentin, 600 mg, Oral, Daily  insulin glargine, 20 Units, Subcutaneous, QHS  insulin lispro, 1-6 Units, Subcutaneous, QHS  insulin lispro, 2-10 Units, Subcutaneous, TID AC  insulin lispro, 5 Units, Subcutaneous, TID AC  lidocaine, 1 patch, Transdermal, Q24H  sevelamer, 800 mg, Oral, TID MEALS  vitamin C, 500 mg, Oral, Daily        Continuous Infusions:   glucagon (rDNA), 1 mg, PRN   And  dextrose, 250 mL, PRN   And  dextrose, 25 g, PRN   And  dextrose, 25 g, PRN  hydrALAZINE, 10 mg, Q6H PRN  melatonin, 3 mg, QHS PRN  naloxone, 0.2 mg, PRN  oxyCODONE, 5 mg, Q4H PRN  sodium chloride, 100 mL, Q1H PRN  sodium chloride, 250 mL, PRN          I personally reviewed all of the  medications.  Medication list generated using all available resources.  Elder abuse (physical)  - negative  Advanced care plan - reviewed from chart or in discussion with pt or family    Allergies   Allergen Reactions    Lisinopril Hives    Metformin Hives       Social & Family History:     Social History     Socioeconomic History    Marital status: Legally Separated   Tobacco Use    Smoking status: Never    Smokeless tobacco: Never   Vaping Use    Vaping Use: Never used   Substance and Sexual Activity    Alcohol use: Never    Drug use: Never    Sexual activity: Not Currently       Family History   Problem Relation Age of Onset    Diabetes Mother     Hypertension Mother     Diabetes Father     Hypertension Father     Diabetes Sister     Hypertension Sister     Diabetes Brother  Hypertension Brother     Diabetes Paternal Aunt     Diabetes Paternal Uncle        Review of Systems:   No eye, ear nose, throat problems; no coughing or wheezing or shortness of breath, No chest pain or orthopnea, no abdominal pain, nausea or vomiting, No pain in the body or extremities, no psychiatric, neurological, endocrine, hematological or cardiac complaints except as noted above.     Physical Exam:   Blood pressure 94/61, pulse 72, temperature 98.2 F (36.8 C), temperature source Oral, resp. rate 16, height 1.575 m ('5\' 2"'$ ), weight 108.2 kg (238 lb 8.6 oz), SpO2 92 %.    Pt is alert and appropriate  Face symmetric  L hand/fingers/wrist, proximal arm edema.  Severe pain with light touching L wrist/hand - + edema of left arm  Cannot really grip (2/5) with fingers/thumb - seems in part pain and edema related.  Cannot handle any movement at the wrist level.  Eventually able to demonstrate at least 4+/5 biceps though pain was a bit limiting here as well.  Reduced sensation to LT L hand/arm to the shoulder compared with R sided.  Intact to LT LEs    Labs:     Recent Labs   Lab 10/22/20  0652 10/21/20  0353 10/20/20  1157   Glucose 218*  515* 476*   BUN 31.0* 37.0* 64.0*   Creatinine 4.3* 5.2* 8.0*   Calcium 8.3* 7.8* 7.9*   Sodium 139 138 135   Potassium 4.0 4.2 4.7   Chloride 103 102 100   CO2 '26 26 22   '$ Albumin  --  2.4* 2.7*   Phosphorus  --   --  5.4*   Magnesium  --   --  2.0   AST (SGOT)  --  12 22   ALT  --  32 41   Bilirubin, Total  --  0.6 0.5   Alkaline Phosphatase  --  151* 186*     Recent Labs   Lab 10/22/20  0652 10/21/20  0353 10/20/20  1157   WBC 4.69 5.34 7.68   Hgb 9.7* 9.3* 9.8*   Hematocrit 32.2* 30.2* 31.2*   MCV 99.4* 96.5* 95.4   MCH 29.9 29.7 30.0   MCHC 30.1* 30.8* 31.4*   Platelets 79* 67* 65*         No results for input(s): PTT, PT, INR in the last 72 hours.       Radiology Results (24 Hour)       Procedure Component Value Units Date/Time    US Abdomen Complete [id number] Collected: 10/22/20 1001    Order Status: Completed Updated: 10/22/20 1004    Narrative:      INDICATION: Thrombocytopenia.    TECHNIQUE: Static images from abdominal ultrasound presented for  evaluation.     COMPARISON: None available.    FINDINGS:  Liver: Normal in echogenicity, echotexture, and contour. Enlarged. Liver  measures 18.6 cm in length. Color Doppler imaging of the main portal  vein demonstrates hepatopetal flow.  Gallbladder: Normal. No gallstones.   Bile ducts: Nondilated. The common bile duct is 0.3 cm in diameter.  Pancreas: Normal, where visualized.  The tail is obscured by overlying  bowel gas.  Spleen: Normal, measuring 11.6 cm.  Kidneys: The right kidney measures 9.6 cm in length. The left kidney  measures 10.7 cm in length. There is no hydronephrosis, renal calculi,  or suspicious mass.  IVC: Patent, where visualized.   Aorta: Normal in caliber,  where visualized.      Impression:         1. Hepatomegaly. No evidence of splenomegaly.    Earney Navy, MD   10/22/2020 10:02 AM    CT Boney Pelvis [id number] Collected: 10/22/20 0740    Order Status: Completed Updated: 10/22/20 0746    Narrative:      CT BONEY PELVIS    CLINICAL INDICATION:    bilateral hip pain    COMPARISON: None    TECHNIQUE: 3 mm axial images through the pelvis with sagittal and  coronal reformatted images. The following ?dose reduction techniques  were utilized: automated exposure control and/or adjustment of the  mA and/or kV according to patient size, and the use of iterative  reconstruction technique.    FINDINGS:      There is osteopenia of the imaged osseous structures. There is no  evidence for fracture or dislocation of the pelvis or either hip. There  are mild degenerative changes of the hips and sacroiliac joints. There  are degenerative changes in the partially visualized lower lumbar spine.  There are atherosclerotic vascular calcifications, calcified uterine  fibroids, and colonic diverticula incidentally noted.        Impression:       No CT evidence for fracture or dislocation of the pelvis or  either hip.    Edison Simon, MD   10/22/2020 7:44 AM    CT Forearm Left WO Contrast [id number] Collected: 10/22/20 0725    Order Status: Completed Updated: 10/22/20 0740    Narrative:      CT WRIST LEFT WO CONTRAST, CT FOREARM LEFT WO CONTRAST    CLINICAL INDICATION:   severe pain after a fall    COMPARISON: None    TECHNIQUE: 3 mm axial images through the left wrist and forearm with  sagittal and coronal reformatted images. The following ?dose reduction  techniques were utilized: automated exposure control and/or  adjustment of the mA and/or kV according to patient size, and the use  of iterative reconstruction technique.    FINDINGS:      The study is limited secondary to limited patient positioning and streak  artifact related to imaging of the wrist and forearm at the patient's  side. There is osteopenia of the imaged osseous structures. There is no  gross CT evidence for fracture or dislocation of the left wrist, radius,  or ulna within study limitations. However, subtle nondisplaced fractures  cannot be excluded on this study. There are atherosclerotic  vascular  calcifications. There is nonspecific stranding in the subcutaneous fat.  There is suggestion of an elbow effusion.      Impression:           No gross CT evidence for fracture or dislocation of the left wrist,  radius, or ulna within study limitations as described above. However,  nondisplaced fractures cannot be excluded. If there is persistent  clinical concern for wrist or elbow fracture, further evaluation with  MRI is recommended    Edison Simon, MD   10/22/2020 7:38 AM    CT Wrist Left WO Contrast [id number] Collected: 10/22/20 0725    Order Status: Completed Updated: 10/22/20 0740    Narrative:      CT WRIST LEFT WO CONTRAST, CT FOREARM LEFT WO CONTRAST    CLINICAL INDICATION:   severe pain after a fall    COMPARISON: None    TECHNIQUE: 3 mm axial images through the left wrist and forearm with  sagittal and coronal reformatted images.  The following ?dose reduction  techniques were utilized: automated exposure control and/or  adjustment of the mA and/or kV according to patient size, and the use  of iterative reconstruction technique.    FINDINGS:      The study is limited secondary to limited patient positioning and streak  artifact related to imaging of the wrist and forearm at the patient's  side. There is osteopenia of the imaged osseous structures. There is no  gross CT evidence for fracture or dislocation of the left wrist, radius,  or ulna within study limitations. However, subtle nondisplaced fractures  cannot be excluded on this study. There are atherosclerotic vascular  calcifications. There is nonspecific stranding in the subcutaneous fat.  There is suggestion of an elbow effusion.      Impression:           No gross CT evidence for fracture or dislocation of the left wrist,  radius, or ulna within study limitations as described above. However,  nondisplaced fractures cannot be excluded. If there is persistent  clinical concern for wrist or elbow fracture, further evaluation with  MRI is  recommended    Edison Simon, MD   10/22/2020 7:38 AM    US Venous Low Extrem Duplx Dopp Comp Bilat [id number] Resulted: 10/21/20 2047    Order Status: Sent Updated: 10/21/20 2115             All recent brain and spine imaging (MRI, CT) results reviewed.    Chart reviewed    Code status confirmed    Case discussed with: patient and attending    40 minutes;  involving time spent examining patient, in counseling or coordination of care, reviewing test results, and in documentation.    Signed by: Merceda Elks, MD  [place]: [phone]       Answering Service: [phone]

## 2020-10-22 NOTE — Progress Notes (Signed)
[facility] PROGRESS NOTE  [id number]      Date Time: 10/22/20 5:41 AM  Patient Name: [name]  Attending Physician: Dillard Essex, MD    CC: follow-up ESRD    Assessment:     1. ESRD on HD MWF at [facility]    -History of noncompliance with treatments and had not been dialyzed in a week  2. Anemia of CKD  3. HTN emergency: resolved  4. Volume Overload, improving after additional UF treatment  5. Acute hypoxic resp failure due to volume overload - resolved  6. Hyperglycemia    Recommendations:     S/p HD yesterday with 2L UF  HD again today to continue with routine OP HD schedule  May try prednisone '20mg'$  daily x5 days for wrist pain    Case discussed with: Patient    Verl Blalock, MD  [facility]  [id number] (office)      Subjective: on RA, continue to reports L hand pain. Not SOB    Review of Systems:   Cardio: no chest pain, edema  Pulm: no SOB, no cough  MSK: L hand pain      Physical Exam:     Vitals:    10/21/20 2003 10/21/20 2226 10/21/20 2347 10/22/20 0404   BP: 121/71 116/53 106/60 117/81   Pulse: 65 73 70 63   Resp: '18  18 20   '$ Temp: 97.7 F (36.5 C)  98.2 F (36.8 C) 98.4 F (36.9 C)   TempSrc: Oral  Oral Oral   SpO2: 95%  95% 95%   Weight:       Height:           Intake and Output Summary (Last 24 hours) at Date Time    Intake/Output Summary (Last 24 hours) at 10/22/2020 0541  Last data filed at 10/21/2020 1902  Gross per 24 hour   Intake 600 ml   Output 2350 ml   Net -1750 ml         General: awake, alert, oriented x 3, no acute distress.  Cardiovascular: regular rate and rhythm  Lungs: clear to auscultation bilaterally, bilateral air entry, normal work of breathing  Abdomen: soft, NT, ND  Extremities: 1+ edema      Meds:      Scheduled Meds: PRN Meds:    acetaminophen, 1,000 mg, Oral, Q8H  amLODIPine, 10 mg, Oral, Daily  aspirin, 81 mg, Oral, Daily  carvedilol, 25 mg, Oral, Q12H SCH  gabapentin, 600 mg, Oral, Daily  insulin glargine, 20  Units, Subcutaneous, QHS  insulin lispro, 1-6 Units, Subcutaneous, QHS  insulin lispro, 2-10 Units, Subcutaneous, TID AC  insulin lispro, 5 Units, Subcutaneous, TID AC  lidocaine, 1 patch, Transdermal, Q24H  sevelamer, 800 mg, Oral, TID MEALS  vitamin C, 500 mg, Oral, Daily        Continuous Infusions:   glucagon (rDNA), 1 mg, PRN   And  dextrose, 250 mL, PRN   And  dextrose, 25 g, PRN   And  dextrose, 25 g, PRN  hydrALAZINE, 10 mg, Q6H PRN  melatonin, 3 mg, QHS PRN  naloxone, 0.2 mg, PRN  oxyCODONE, 5 mg, Q4H PRN            Labs:     Recent Labs   Lab 10/21/20  0353 10/20/20  1157   WBC 5.34 7.68   Hgb 9.3* 9.8*   Hematocrit 30.2* 31.2*   Platelets 67* 65*       Recent  Labs   Lab 10/21/20  0353 10/20/20  1157   Sodium 138 135   Potassium 4.2 4.7   Chloride 102 100   CO2 26 22   BUN 37.0* 64.0*   Creatinine 5.2* 8.0*   Calcium 7.8* 7.9*   Albumin 2.4* 2.7*   Phosphorus  --  5.4*   Magnesium  --  2.0   Glucose 515* 476*   EGFR 10.0 6.1             Signed by: Verl Blalock, MD

## 2020-10-22 NOTE — OT Eval Note (Addendum)
[facility]   Occupational Therapy Attempt Note    Patient:  [name] MRN#:  [id number]  Unit:  [place] Room/Bed:  [id number]      Occupational Therapy evaluation attempted on 10/22/2020 at 7:36 AM unable to complete secondary to  awaiting results of imaging to r/o fractures. OT requested reading room expedite results of imaging for pelvis and LUE as able.   Occupational therapy will follow up later this AM as able.    RN aware.      Barnetta Chapel, [place], OTR/L [place]  10/22/2020 7:36 AM  -------------------------------------------------------------------------------------------------------------  [facility]   Occupational Therapy Attempt Note    Patient:  [name] MRN#:  [id number]  Unit:  [place] Room/Bed:  [id number]      Occupational Therapy evaluation attempted on 10/22/2020 at 8:39 AM unable to complete secondary to patient off the floor for [place].  Occupational therapy will follow up later today as able.    RN aware.      [place], [place], OTR/L [phone]  10/22/2020 8:39 AM

## 2020-10-22 NOTE — Progress Notes (Signed)
Patient alert, oriented x 3. Denies any pain. Vital signs within normal range. Daily HD for now. Will monitor closely on dialysis.      10/22/20 1545   Bedside Nurse Communication   Name of bedside RN - pre dialysis Janith Lima   Treatment Initiation- With Dialysis Precautions   Time Out/Safety Check Completed Yes   Consent for HD signed for this hospitalization (Date) 10/20/20   Consent for HD signed for this hospitalization (Time) 1545   Preferred language No   Complication waiver N   Blood Consent Verified N/A   Dialysis Precautions All Connections Secured;Saline Line Double Clamped;Venous Parameters Set;Arterial Parameters Set;Air Foam Detecctor Engaged   Dialysis Treatment Type Routine   Special Considerations hypotensive   Is patient diabetic? Yes   RO/Hemodialysis Architectural technologist   Is Total Chlorine less than 0.1 ppm? Yes   Orignial Total Chlorine Testing Time 1500   RO/Hemodialysis Facilities manager Number 3    Machine Serial Number [id number]   RO # 20   RO Serial # [id number]   Water Hardness NA   pH 7.2   Pressure Test Verified Yes   Alarms Verified Passed   Machine Temperature 97.7 F (36.5 C)   Manual Machine Temperature 97.7 F (36.5 C)   Alarms Verified Yes   Na+ mEq (Machine) 138 mEq   Bicarb mEq (Machine) 35 mEq   Hemodialysis Conductivity (Machine) 13.6   Hemodialysis Conductivity (Meter) 13.6   Dialyzer Lot Number [id number]   Tubing Lot Number [id number]   RO Machine Log Completed Yes   Hepatitis Status   HBsAg (Antigen) Result Negative   HBsAg Date Drawn 10/20/20   HBsAg Repeat Draw Due Date 11/16/20   HBsAb (Antibody) Result Non-Reactive   Hepatitis C Antibody Non-Reactive   Dialysis Weight   Pre-Treatment Weight (Kg) 108.2   Scale Type ICU Bed Scale   Vitals   Temp 98 F (36.7 C)   Heart Rate 65   Resp Rate 18   BP 113/55   SpO2 98 %   O2 Device Non-rebreather mask   Assessment   Mental Status Alert;Oriented;Cooperative   Cardiac (WDL) WDL   Cardiac Regularity Regular   Cardiac  Rhythm Normal Sinus Rhythm   Respiratory  WDL   Respiratory Pattern Regular   Bilateral Breath Sounds Clear;Diminished   Edema  WDL   Generalized Edema +1   Facial Non Pitting Edema   Sacral UTA   RLE Edema Non Pitting Edema   LLE Edema Non Pitting Edema   General Skin Color Appropriate for ethnicity   Skin Condition/Temp Warm;Dry   Gastrointestinal (WDL) WDL   Abdomen Inspection Soft;Nondistended   GI Symptoms None   Mobility Ambulatory with Assistance   Pain Assessment   Charting Type Assessment   Pain Scale Used Numeric Scale (0-10)   Numeric Pain Scale   Pain Score 0   POSS Score 1   Hemodialysis Comments   Pre-Hemodialysis Comments time out done   Hemodialysis History Information   Outpatient Dialysis Unit [facility]   Chronic Dialysis Patient Yes   Outpatient Nephrologist Dr Randel Pigg

## 2020-10-22 NOTE — Consults (Signed)
[place] REPORT  [facility]        Date Time: 10/22/20 8:25 AM  Patient Name: [name]  Requesting Physician: Dillard Essex, MD  Consulting Cardiologist:  Orpah Clinton, DO  MRN:  [id number]  CSN:   [id number]  Date of Admission:  10/20/2020       Reason for Consultation:     Elevated troponin    History:   [name] is a 65 y.o. female admitted on 10/20/2020.  We have been asked by Dillard Essex, MD,  to provide cardiac consultation, regarding elevated troponin.    65 year old female with past medical history of hypertension, hyperlipidemia, type 2 diabetes, OSA, ESRD on HD, and morbid obesity coming in for a fall.  She fell at home when she rolled out of her bed.  She was brought into the ER and labs were performed.  She has a mildly elevated troponin.  She denies chest pain, shortness of breath and palpitations at rest.  EKG shows normal sinus rhythm.  She has had an echocardiogram done in 2021 which showed ejection fraction of 65% but was a limited study secondary to body habitus.  Patient has had trouble getting into the office because she has not had an ID.  She is going to the [facility] next week to get a license.  This morning patient continues to have no chest pain or shortness of breath at rest.  Patient denies fever, night sweats and chills.    Past Medical History:     Past Medical History:   Diagnosis Date    Chronic obstructive pulmonary disease     Diabetes mellitus     Hyperlipidemia     Hypertension     Sleep apnea 2018       Past Surgical History:     Past Surgical History:   Procedure Laterality Date    EGD, BIOPSY N/A 05/14/2019    Procedure: EGD, BIOPSY;  Surgeon: Ronie Spies, MD;  Location: [facility];  Service: Gastroenterology;  Laterality: N/A;    EXPLORATORY LAPAROTOMY N/A 05/27/2019    Procedure: EXPLORATORY LAPAROTOMY;  Surgeon: Herbert Moors., MD;  Location: [facility];  Service: General;   Laterality: N/A;    JOINT REPLACEMENT      shoulder    JOINT REPLACEMENT      knee    OVARY SURGERY Left     Removal    REMOVAL, FOREIGN BODY N/A 05/27/2019    Procedure: REMOVAL, PERITONEAL DIALYSIS CATHETER;  Surgeon: Herbert Moors., MD;  Location: [facility];  Service: General;  Laterality: N/A;    TUNNELED CATH CHECK/CHANGE (PERMCATH) N/A 07/03/2019    Procedure: TUNNELED CATH CHECK/CHANGE;  Surgeon: Maureen Ralphs, MD;  Location: [facility];  Service: Interventional Radiology;  Laterality: N/A;    TUNNELED CATH PLACEMENT ([facility]MCATH) Bilateral 05/23/2019    Procedure: TUNNELED CATH PLACEMENT;  Surgeon: Jacques Earthly, MD;  Location: [facility];  Service: Interventional Radiology;  Laterality: Bilateral;    TUNNELED CATH PLACEMENT (PERMCATH) Right 07/31/2019    Procedure: TUNNELED CATH PLACEMENT;  Surgeon: Jacques Earthly, MD;  Location: [facility];  Service: Interventional Radiology;  Laterality: Right;       Family History:     Family History   Problem Relation Age of Onset    Diabetes Mother     Hypertension Mother     Diabetes Father     Hypertension Father     Diabetes Sister  Hypertension Sister     Diabetes Brother     Hypertension Brother     Diabetes Paternal Aunt     Diabetes Paternal Uncle        Social History:     Social History     Socioeconomic History    Marital status: Legally Separated     Spouse name: Not on file    Number of children: Not on file    Years of education: Not on file    Highest education level: Not on file   Occupational History    Not on file   Tobacco Use    Smoking status: Never    Smokeless tobacco: Never   Vaping Use    Vaping Use: Never used   Substance and Sexual Activity    Alcohol use: Never    Drug use: Never    Sexual activity: Not Currently   Other Topics Concern    Not on file   Social History Narrative    Not on file     Social Determinants of Health     Financial Resource Strain: Not on file   Food Insecurity: Not on file   Transportation Needs: Not  on file   Physical Activity: Not on file   Stress: Not on file   Social Connections: Not on file   Intimate Partner Violence: Not on file   Housing Stability: Not on file       Allergies:     Allergies   Allergen Reactions    Lisinopril Hives    Metformin Hives       Medications:     Medications Prior to Admission   Medication Sig    amLODIPine (NORVASC) 10 MG tablet Take 1 tablet (10 mg total) by mouth daily    aspirin 81 MG chewable tablet Chew 81 mg by mouth daily    carvedilol (COREG) 25 MG tablet Take 1 tablet by mouth daily    gabapentin (NEURONTIN) 600 MG tablet Take 1 tablet by mouth daily    sevelamer (RENVELA) 800 MG tablet Take 800 mg by mouth 3 (three) times daily with meals    vitamin C (ASCORBIC ACID) 500 MG tablet Take 1 tablet (500 mg total) by mouth daily    atorvastatin (LIPITOR) 40 MG tablet Take 40 mg by mouth daily        Current Facility-Administered Medications   Medication Dose Route Frequency    acetaminophen  1,000 mg Oral Q8H    amLODIPine  10 mg Oral Daily    aspirin  81 mg Oral Daily    carvedilol  25 mg Oral Q12H SCH    gabapentin  600 mg Oral Daily    insulin glargine  20 Units Subcutaneous QHS    insulin lispro  1-6 Units Subcutaneous QHS    insulin lispro  2-10 Units Subcutaneous TID AC    insulin lispro  5 Units Subcutaneous TID AC    lidocaine  1 patch Transdermal Q24H    sevelamer  800 mg Oral TID MEALS    vitamin C  500 mg Oral Daily         Review of Systems:    Comprehensive review of systems including constitutional, eyes, ears, nose, mouth, throat, cardiovascular, GI, GU, musculoskeletal, integumentary, respiratory, neurologic, psychiatric, and endocrine is negative other than what is mentioned already in the history of present illness    Physical Exam:     VITAL SIGNS PHYSICAL EXAM   Vitals:  10/22/20 0751   BP: 101/62   Pulse: 62   Resp: 16   Temp: 98.2 F (36.8 C)   SpO2: 92%     Temp (24hrs), Avg:97.9 F (36.6 C), Min:97.5 F (36.4 C), Max:98.4 F (36.9  C)      Intake and Output Summary (Last 24 hours) at Date Time    Intake/Output Summary (Last 24 hours) at 10/22/2020 0825  Last data filed at 10/21/2020 1902  Gross per 24 hour   Intake 600 ml   Output 2350 ml   Net -1750 ml       Telemetry: no changes Physical Exam  General: awake, alert, breathing comfortably, no acute distress  Head: normocephalic  Eyes: Lids & conjunctiva normal  Cardiovascular: regular rate and rhythm, normal S1, S2, no S3, no S4, 3/6 systolic ejection murmur at the apex  Neck: No JVD;  No Carotid bruit  Lungs: clear to auscultation bilateraly, without wheezing, rhonchi, or rales  Abdomen: soft, non-tender, no HJR  Extremities: no edema  Pulse: 2+ equal bilaterally   Neurological: No gross motor defect  Psychiatric: Alert and oriented X3, mood and affect normal  Musculoskeletal: normal strength and tone     Labs Reviewed:     Recent Labs   Lab 10/21/20  1306 10/21/20  0353 10/20/20  1157   Creatine Kinase (CK) 33  --   --    Troponin I  --  0.35* 0.42*         Recent Labs   Lab 10/21/20  0353   Cholesterol 153   Triglycerides 75   HDL 55   LDL Calculated 83     Recent Labs   Lab 10/21/20  0353   Bilirubin, Total 0.6   Protein, Total 5.4*   Albumin 2.4*   ALT 32   AST (SGOT) 12     Recent Labs   Lab 10/20/20  1157   Magnesium 2.0         Recent Labs   Lab 10/22/20  0652 10/21/20  0353 10/20/20  1157   WBC 4.69 5.34 7.68   Hgb 9.7* 9.3* 9.8*   Hematocrit 32.2* 30.2* 31.2*   Platelets 79* 67* 65*     Recent Labs   Lab 10/22/20  0652 10/21/20  0353 10/20/20  1157   Sodium 139 138 135   Potassium 4.0 4.2 4.7   Chloride 103 102 100   CO2 '26 26 22   '$ BUN 31.0* 37.0* 64.0*   Creatinine 4.3* 5.2* 8.0*   EGFR 12.5 10.0 6.1   Glucose 218* 515* 476*   Calcium 8.3* 7.8* 7.9*       DIAGNOSTIC    EKG: Normal sinus rhythm    chest X-ray    Assessment:   Mechanical fall  Elevated troponin-demand ischemia  ESRD on HD  Morbid obesity  Type 2 diabetes  OSA not on  CPAP  Hypertension  Hyperlipidemia    Recommendations:     65 year old female with past medical history of hypertension, hyperlipidemia, type 2 diabetes, OSA, ESRD on HD, and morbid obesity coming in for a fall.  Cardiology is consulted for elevated troponin.  Patient's troponin is very small and flat.  She has no active chest pain.  EKG is nonischemic.  This likely reflects demand ischemia in the setting of likely underlying coronary disease.  Patient's blood pressure is well controlled and she is on primary prevention aspirin.  Would start 40 mg of atorvastatin.  Patient should continue inpatient  care for presenting symptoms of fall and dialysis.  We can have her follow-up as an outpatient and consider stress testing at that time.  Case discussed with primary medical team.  Cardiology will sign off for now.  We will have her follow-up as an outpatient.        Signed by: Orpah Clinton, DO    [place] [phone] or [phone] (8am-5pm)  Office/Pager: [phone]  NP Spectralink [phone]   After hours, non urgent consult line [phone]  After Hours, urgent consults [phone]

## 2020-10-22 NOTE — Plan of Care (Signed)
Problem: Renal Instability  Goal: Fluid and electrolyte balance are achieved/maintained  Outcome: Progressing  Flowsheets (Taken 10/22/2020 1621)  Fluid and electrolyte balance are achieved/maintained:   Monitor/assess lab values and report abnormal values   Assess and reassess fluid and electrolyte status     Problem: Patient Receiving Advanced Renal Therapies  Goal: Therapy access site remains intact  Outcome: Progressing  Flowsheets (Taken 10/22/2020 1621)  Therapy access site remains intact:   Assess therapy access site   Change therapy access site dressing as needed

## 2020-10-22 NOTE — Plan of Care (Signed)
NURSING SHIFT NOTE     Patient: [name]  Day: 2      SHIFT EVENTS     Shift Narrative/Significant Events (PRN med administration, fall, RRT, etc.):     Pt A&Ox4, on room air, NSR on tele, and stable at this time.    Pt reported total numbness of left wrist and hand, except for pain. NIH remained at baseline. MD informed. Neurologist consulted with patient and ordered CT cervical spine.    Pt able to sit in chair for 2 hours and have a BM on commode. Pt able to ambulate a few steps with the cane and 2x assist.    Pt medicated for pain with roxicodone twice this shift.    Fall precautions in place. Grip socks applied. Bed locked and lowered with alarms on. Possessions, bed side table, call light, and tray table within reach. Pt instructed to call before attempting to exit bed. Demonstration and instruction of call light provided.    Safety and fall precautions remain in place. Purposeful rounding completed.          ASSESSMENT     Changes in assessment from patient's baseline this shift:    Neuro: Yes numbness of left wrist which gradually improved.  CV: No  Pulm: No  Peripheral Vascular: No  HEENT: No  GI: No  BM during shift: Yes   , Last BM: Last BM Date: 10/22/20  GU: No   Integ: No  MS: No    Pain: Improved  Pain Interventions: Medications  Medications Utilized: Roxicodone and Tylenol    Mobility: PMP Activity: Step 5 - Chair of Distance Walked (ft) (Step 6,7): 2 Feet           Lines     Patient Lines/Drains/Airways Status       Active Lines, Drains and Airways       Name Placement date Placement time Site Days    Peripheral IV 10/20/20 20 G Right Wrist 10/20/20  1211  Wrist  2    External Urinary Catheter 10/21/20  1000  --  1    Graft/Fistula Left --  --  -- --                         VITAL SIGNS     Vitals:    10/22/20 2000   BP: 126/79   Pulse: 75   Resp: 18   Temp: 97.5 F (36.4 C)   SpO2: 94%       Temp  Min: 97.5 F (36.4 C)  Max: 98.4 F (36.9 C)  Pulse  Min: 62  Max: 75  Resp  Min: 14  Max:  22  BP  Min: 94/61  Max: 136/65  SpO2  Min: 92 %  Max: 98 %      Intake/Output Summary (Last 24 hours) at 10/22/2020 2027  Last data filed at 10/22/2020 1930  Gross per 24 hour   Intake 0 ml   Output 3000 ml   Net -3000 ml              CARE PLAN   FOUR EYES SKIN ASSESSMENT NOTE    [name]  [birth year]  [id number]    Braden Scale Score: 16    POC Initiated for Risk for Altered Skin No      Patient Assessed for Correct Mattress Surface No    *At risk patients with Braden Score less than 12 must be  considered for specialty bed    Mepilex or Adhesive Foam Dressing applied to sacrum/heel if any PI risk factors present: No        If Wound/Pressure Injury present:  Wound/PI assessment documented in LDA (will be shown below): No    Admitting physician notified: No    Wound consult ordered: No        Was skin checked with incoming RN/PCT? Yes    Janith Lima, RN  October 22, 2020    Second RN/PCT Name: Osa Craver                                 Problem: Moderate/High Fall Risk Score >5  Goal: Patient will remain free of falls  Outcome: Progressing     Problem: Renal Instability  Goal: Fluid and electrolyte balance are achieved/maintained  Outcome: Progressing     Problem: Patient Receiving Advanced Renal Therapies  Goal: Therapy access site remains intact  Outcome: Progressing     Problem: Safety  Goal: Patient will be free from injury during hospitalization  Outcome: Progressing  Goal: Patient will be free from infection during hospitalization  Outcome: Progressing     Problem: Pain  Goal: Pain at adequate level as identified by patient  Outcome: Progressing  Flowsheets (Taken 10/21/2020 2305 by Garry Heater, Angela Nevin, RN)  Pain at adequate level as identified by patient:   Identify patient comfort function goal   Reassess pain within 30-60 minutes of any procedure/intervention, per Pain Assessment, Intervention, Reassessment (AIR) Cycle     Problem: Side Effects from Pain Analgesia  Goal: Patient will experience minimal side  effects of analgesic therapy  Outcome: Progressing     Problem: Discharge Barriers  Goal: Patient will be discharged home or other facility with appropriate resources  Outcome: Progressing     Problem: Psychosocial and Spiritual Needs  Goal: Demonstrates ability to cope with hospitalization/illness  Outcome: Progressing     Problem: Compromised Tissue integrity  Goal: Damaged tissue is healing and protected  Outcome: Progressing  Goal: Nutritional status is improving  Outcome: Progressing     Problem: Every Day - Stroke  Goal: Core/Quality measure requirements - Daily  Outcome: Progressing  Flowsheets (Taken 10/21/2020 2305 by Blase Mess, RN)  Core/Quality measure requirements - Daily: Continue stroke education (must include Modifiable Risk Factors, Warning Signs and Symptoms of Stroke, Activation of Emergency Medical System and Follow-up Appointments). Ensure handout has been given and documented.  Goal: Neurological status is stable or improving  Outcome: Progressing  Flowsheets (Taken 10/21/2020 2305 by Garry Heater, Angela Nevin, RN)  Neurological status is stable or improving: Monitor/assess/document neurological assessment (Stroke: every 4 hours)  Goal: Stable vital signs and fluid balance  Outcome: Progressing  Flowsheets (Taken 10/22/2020 2023)  Stable vital signs and fluid balance:   Monitor and assess vitals every 4 hours or as ordered and hemodynamic parameters   Monitor intake and output. Notify LIP if urine output is < 30 mL/hour.  Goal: Patient will maintain adequate oxygenation  Outcome: Progressing  Flowsheets (Taken 10/21/2020 2053)  Patient will maintain adequate oxygenation: Maintain SpO2 of greater than 92%  Goal: Patient's risk of aspiration will be minimized  Outcome: Progressing  Flowsheets (Taken 10/22/2020 2023)  Patient's risk of aspiration will be minimized:   Keep head of bed up a minimum of 30 degrees when hemodynamically stable   HOB up 90 degrees to eat if unable to be OOB  Instruct patient  to take small bites  Goal: Nutritional intake is adequate  Outcome: Progressing  Flowsheets (Taken 10/21/2020 2053)  Nutritional intake is adequate: Allow adequate time for meals  Goal: Elimination patterns are normal or improving  Outcome: Progressing  Flowsheets (Taken 10/22/2020 2023)  Elimination patterns are normal or improving:   Anticipate/assist with toileting needs   Assess for normal bowel sounds  Goal: Mobility/Activity is maintained at optimal level for patient  Outcome: Progressing  Flowsheets (Taken 10/22/2020 2023)  Mobility/activity is maintained at optimal level for patient:   Encourage independent activity per ability   Increase mobility as tolerated/progressive mobility   Maintain proper body alignment   Consult/collaborate with Physical Therapy and/or Occupational Therapy  Goal: Skin integrity is maintained or improved  Outcome: Progressing  Flowsheets (Taken 10/21/2020 2053)  Skin integrity is maintained or improved:   Assess Braden Scale every shift   Turn or reposition patient every 2 hours or as needed unless able to reposition self   Keep skin clean and dry  Goal: Neurovascular status is stable or improving  Outcome: Progressing  Flowsheets (Taken 10/21/2020 2053)  Neurovascular status is stable or improving: Monitor/assess neurovascular status (pulses, capillary refill, pain, paresthesia, presence of edema)  Goal: Effective coping demonstrated  Outcome: Progressing  Flowsheets (Taken 10/21/2020 2053)  Effective coping demonstrated: Offer reassurance to decrease anxiety  Goal: Will be able to express needs and understand communication  Outcome: Progressing  Flowsheets (Taken 10/21/2020 2053)  Able to express needs and understand communication: Include patient care companion in decisions related to communication     Problem: Day of Discharge - Stroke  Goal: Able to express understanding of discharge instructions and stroke education  Outcome: Progressing  [place] (Taken 10/21/2020 2053)  Able to  express understanding of discharge instructions and stroke education: Provide alternative method of communication if needed  Goal: Core/Quality measures - Discharge  Outcome: Progressing  Flowsheets (Taken 10/21/2020 2053)  Core/Quality measures - Discharge: Document discharge NIH Stroke Scale

## 2020-10-22 NOTE — Plan of Care (Incomplete)
NURSING SHIFT NOTE     Patient: [name]  Day: 2      SHIFT EVENTS     Shift Narrative/Significant Events (PRN med administration, fall, RRT, etc.):     VSS. Pt returned from dialysis and CT.  Pt reported having hallucinations down in dialysis that resolved around 10 pm.    Safety and fall precautions remain in place. Purposeful rounding completed.          ASSESSMENT     Changes in assessment from patient's baseline this shift:    Neuro: Yes Pt disoriented to place, reports hallucinations during beginning of shift.   CV: No  Pulm: No  Peripheral Vascular: No  HEENT: No  GI: No  BM during shift: No, Last BM: Last BM Date: 10/22/20  GU: No   Integ: No  MS: No    Pain: Left arm  Pain Interventions: Rest      Mobility: PMP Activity: Step 3 - Bed Mobility of Distance Walked (ft) (Step 6,7): 2 Feet           Lines     Patient Lines/Drains/Airways Status       Active Lines, Drains and Airways       Name Placement date Placement time Site Days    Peripheral IV 10/20/20 20 G Right Wrist 10/20/20  1211  Wrist  2    External Urinary Catheter 10/21/20  1000  --  1    Graft/Fistula Left --  --  -- --                         VITAL SIGNS     Vitals:    10/22/20 2146   BP: 119/75   Pulse: 82   Resp:    Temp:    SpO2:        Temp  Min: 97.5 F (36.4 C)  Max: 98.4 F (36.9 C)  Pulse  Min: 62  Max: 82  Resp  Min: 14  Max: 22  BP  Min: 94/61  Max: 136/65  SpO2  Min: 92 %  Max: 98 %      Intake/Output Summary (Last 24 hours) at 10/22/2020 2341  Last data filed at 10/22/2020 1930  Gross per 24 hour   Intake 0 ml   Output 3000 ml   Net -3000 ml            CARE PLAN         Problem: Moderate/High Fall Risk Score >5  Goal: Patient will remain free of falls  Outcome: Progressing  Flowsheets (Taken 10/22/2020 2000)  High (Greater than 13):   HIGH-Initiate use of floor mats as appropriate   HIGH-Apply yellow "Fall Risk" arm band   HIGH-Bed alarm on at all times while patient in bed     Problem: Renal Instability  Goal: Fluid and  electrolyte balance are achieved/maintained  Outcome: Progressing  Flowsheets (Taken 10/22/2020 2340)  Fluid and electrolyte balance are achieved/maintained:   Monitor intake and output every shift   Monitor/assess lab values and report abnormal values   Monitor daily weight     Problem: Every Day - Stroke  Goal: Neurological status is stable or improving  Outcome: Progressing  Flowsheets (Taken 10/22/2020 2340)  Neurological status is stable or improving:   Monitor/assess/document neurological assessment (Stroke: every 4 hours)   Monitor/assess NIH Stroke Scale  Goal: Stable vital signs and fluid balance  Outcome: Progressing  Flowsheets (Taken  10/22/2020 2340)  Stable vital signs and fluid balance:   Position patient for maximum circulation/cardiac output   Monitor and assess vitals every 4 hours or as ordered and hemodynamic parameters  Goal: Patient's risk of aspiration will be minimized  Outcome: Progressing  Flowsheets (Taken 10/22/2020 2340)  Patient's risk of aspiration will be minimized: Monitor/assess for signs of aspiration (tachypnea, cough, wheezing, clearing throat, hoarseness after eating, decrease in SaO2  Goal: Neurovascular status is stable or improving  Outcome: Progressing  Flowsheets (Taken 10/22/2020 2340)  Neurovascular status is stable or improving:   Monitor/assess neurovascular status (pulses, capillary refill, pain, paresthesia, presence of edema)   Monitor/assess for signs of Venous Thrombus Emboli (edema of calf/thigh redness, pain)

## 2020-10-23 LAB — CBC AND DIFFERENTIAL
Absolute NRBC: 0 10*3/uL (ref 0.00–0.00)
Basophils Absolute Automated: 0.01 10*3/uL (ref 0.00–0.08)
Basophils Automated: 0.2 %
Eosinophils Absolute Automated: 0.02 10*3/uL (ref 0.00–0.44)
Eosinophils Automated: 0.4 %
Hematocrit: 34.2 % — ABNORMAL LOW (ref 34.7–43.7)
Hgb: 10.6 g/dL — ABNORMAL LOW (ref 11.4–14.8)
Immature Granulocytes Absolute: 0.02 10*3/uL (ref 0.00–0.07)
Immature Granulocytes: 0.4 %
Lymphocytes Absolute Automated: 0.73 10*3/uL (ref 0.42–3.22)
Lymphocytes Automated: 14 %
MCH: 30.2 pg (ref 25.1–33.5)
MCHC: 31 g/dL — ABNORMAL LOW (ref 31.5–35.8)
MCV: 97.4 fL — ABNORMAL HIGH (ref 78.0–96.0)
MPV: 12.8 fL — ABNORMAL HIGH (ref 8.9–12.5)
Monocytes Absolute Automated: 0.41 10*3/uL (ref 0.21–0.85)
Monocytes: 7.9 %
Neutrophils Absolute: 4.01 10*3/uL (ref 1.10–6.33)
Neutrophils: 77.1 %
Nucleated RBC: 0 /100 WBC (ref 0.0–0.0)
Platelets: 93 10*3/uL — ABNORMAL LOW (ref 142–346)
RBC: 3.51 10*6/uL — ABNORMAL LOW (ref 3.90–5.10)
RDW: 13 % (ref 11–15)
WBC: 5.2 10*3/uL (ref 3.10–9.50)

## 2020-10-23 LAB — GFR: EGFR: 13.6

## 2020-10-23 LAB — HAPTOGLOBIN: Haptoglobin: 225 mg/dL (ref 35–250)

## 2020-10-23 LAB — GLUCOSE WHOLE BLOOD - POCT
Whole Blood Glucose POCT: 375 mg/dL — ABNORMAL HIGH (ref 70–100)
Whole Blood Glucose POCT: 512 mg/dL (ref 70–100)
Whole Blood Glucose POCT: 314 mg/dL — ABNORMAL HIGH (ref 70–100)
Whole Blood Glucose POCT: 300 mg/dL — ABNORMAL HIGH (ref 70–100)

## 2020-10-23 LAB — RETICULOCYTES
Immature Retic Fract: 10.3 % (ref 1.2–15.6)
Reticulocyte Count Absolute: 0.0593 10*6/uL (ref 0.0220–0.1420)
Reticulocyte Count Automated: 1.7 % (ref 0.8–2.3)
Reticulocyte Hemoglobin: 26.4 pg — ABNORMAL LOW (ref 28.4–40.2)

## 2020-10-23 LAB — RECTAL SWAB CARBAPENEMASE SURVEILLANCE PCR
IMP-Gram Neg Res Genes PCR: NOT DETECTED
KPC-Gram Neg Res Genes PCR: NOT DETECTED
NDM Gram Neg Res Genes PCR: NOT DETECTED
OXA-48-GRAM NEG RES GENES PCR: NOT DETECTED
VIM-Gram Neg Res Genes PCR: NOT DETECTED

## 2020-10-23 LAB — BASIC METABOLIC PANEL
Anion Gap: 12 (ref 5.0–15.0)
BUN: 36 mg/dL — ABNORMAL HIGH (ref 7.0–21.0)
CO2: 27 mEq/L (ref 17–29)
Calcium: 8.4 mg/dL — ABNORMAL LOW (ref 8.5–10.5)
Chloride: 98 mEq/L — ABNORMAL LOW (ref 99–111)
Creatinine: 4 mg/dL — ABNORMAL HIGH (ref 0.4–1.0)
Glucose: 494 mg/dL — ABNORMAL HIGH (ref 70–100)
Potassium: 4.7 mEq/L (ref 3.5–5.3)
Sodium: 137 mEq/L (ref 135–145)

## 2020-10-23 MED ORDER — INSULIN GLARGINE 100 UNIT/ML SC SOLN
20.0000 [IU] | Freq: Every morning | SUBCUTANEOUS | Status: DC
Start: 2020-10-23 — End: 2020-10-24
  Administered 2020-10-23 – 2020-10-24 (×2): 20 [IU] via SUBCUTANEOUS
  Filled 2020-10-23 (×2): qty 20

## 2020-10-23 NOTE — Progress Notes (Signed)
[facility] PROGRESS NOTE  [id number]      Date Time: 10/23/20 2:11 PM  Patient Name: [name]  Attending Physician: Dillard Essex, MD    CC: follow-up ESRD    Assessment:     1. ESRD on HD MWF at [facility]    -History of noncompliance with treatments and had not been dialyzed in a week  2. Anemia of CKD  3. HTN emergency: resolved  4. Volume Overload, improving after additional UF treatment  5. Acute hypoxic resp failure due to volume overload - resolved  6. Hyperglycemia    Recommendations:     S/p HD yesterday with 3L UF  Stable from renal perspective    Case discussed with: Patient, RN    Verl Blalock, MD  [facility]  [id number] (office)      Subjective: doing well, slight pain on her left wrist, otherwise no complaints    Review of Systems:   Cardio: no chest pain, edema  Pulm: no SOB, no cough  MSK: L hand pain      Physical Exam:     Vitals:    10/23/20 0400 10/23/20 0800 10/23/20 0951 10/23/20 1145   BP: 117/62 131/76 128/66 134/80   Pulse: 66 67 72 72   Resp: '22 20  21   '$ Temp: 97.6 F (36.4 C) 97.7 F (36.5 C) 97.7 F (36.5 C) 98.7 F (37.1 C)   TempSrc: Oral Oral Oral Oral   SpO2: 99% 95% 96% 97%   Weight:  108.1 kg (238 lb 5.1 oz)     Height:           Intake and Output Summary (Last 24 hours) at Date Time    Intake/Output Summary (Last 24 hours) at 10/23/2020 1411  Last data filed at 10/22/2020 1930  Gross per 24 hour   Intake --   Output 3000 ml   Net -3000 ml         General: awake, alert, oriented x 3, no acute distress.  Cardiovascular: regular rate and rhythm  Lungs: clear to auscultation bilaterally, bilateral air entry, normal work of breathing  Abdomen: soft, NT, ND  Extremities: trace edema      Meds:      Scheduled Meds: PRN Meds:    acetaminophen, 1,000 mg, Oral, Q8H  amLODIPine, 10 mg, Oral, Daily  aspirin, 81 mg, Oral, Daily  atorvastatin, 40 mg, Oral, QHS  carvedilol, 25 mg, Oral, Q12H SCH  gabapentin, 600 mg, Oral,  Daily  insulin glargine, 20 Units, Subcutaneous, QHS  insulin glargine, 20 Units, Subcutaneous, QAM  insulin lispro, 1-6 Units, Subcutaneous, QHS  insulin lispro, 2-10 Units, Subcutaneous, TID AC  insulin lispro, 5 Units, Subcutaneous, TID AC  lidocaine, 1 patch, Transdermal, Q24H  sevelamer, 800 mg, Oral, TID MEALS  vitamin C, 500 mg, Oral, Daily        Continuous Infusions:   glucagon (rDNA), 1 mg, PRN   And  dextrose, 250 mL, PRN   And  dextrose, 25 g, PRN   And  dextrose, 25 g, PRN  hydrALAZINE, 10 mg, Q6H PRN  melatonin, 3 mg, QHS PRN  naloxone, 0.2 mg, PRN  oxyCODONE, 5 mg, Q4H PRN            Labs:     Recent Labs   Lab 10/23/20  1101 10/22/20  0652 10/21/20  0353   WBC 5.20 4.69 5.34   Hgb 10.6* 9.7* 9.3*   Hematocrit 34.2* 32.2* 30.2*  Platelets 93* 79* 67*       Recent Labs   Lab 10/23/20  1101 10/22/20  0652 10/21/20  0353 10/20/20  1157   Sodium 137 139 138 135   Potassium 4.7 4.0 4.2 4.7   Chloride 98* 103 102 100   CO2 '27 26 26 22   '$ BUN 36.0* 31.0* 37.0* 64.0*   Creatinine 4.0* 4.3* 5.2* 8.0*   Calcium 8.4* 8.3* 7.8* 7.9*   Albumin  --   --  2.4* 2.7*   Phosphorus  --   --   --  5.4*   Magnesium  --   --   --  2.0   Glucose 494* 218* 515* 476*   EGFR 13.6 12.5 10.0 6.1             Signed by: Verl Blalock, MD

## 2020-10-23 NOTE — Progress Notes (Addendum)
1000  MD notified of patient reporting left sided facial numbness. Pt states reduced sensation on left side has been occurring since fall at home, however, this is the first time the pt has reported this, despite Rn asking for past 2 days.    MD informed. MD stated Q 4 hr neuro checks not needed. No changes to plan of care recommended.

## 2020-10-23 NOTE — Progress Notes (Addendum)
Hospitalist Progress note       Patient: [name]  Date: 10/23/2020   LOS: 3 Days  Admission Date: 10/20/2020   MRN: [id number]  Attending: Dillard Essex, MD  Please contact me on epic chat        ASSESSMENT/PLAN     [name] is a 65 y.o. female admitted with Shortness of breath    Interval Summary:       Patient Active Hospital Problem List:    Shortness of breath and pulmonary edema  resolved   Chest x-ray with  interstitial markings suggestive of pulmonary vascular congestion.  likely due to missed dialysis  Status post dialysis on 10/20/2020 , 10/21/2020  and 9/30 appreciate nephrology consult    Elevated troponin possibly demand ischemia in the setting of end-stage renal disease and pulmonary edema due to missed dialysis   Ruled out ACS  Patient denied chest pain  Echocardiogram done on 07/21/2019 shows normal EF with 65 to 70% with mild mitral regurgitation small to moderate circumferential pericardial effusion  [facility] consult greatly appreciated no further testing required follow-up as an outpatient  ASA 81 mg daily     Severe left wrist pain resolved   bilateral hip pain resolved more on the left after a fall at home.  Rule out fracture  Reviewed all x-ray no evidence of fracture but shows osteopenia unable to rule out nondisplaced fracture   CT scan of the left wrist no evidence of a fracture  Cancelled MRI of the left wrist given significant improvement in symptoms   S/P prednisone 20 mg daily X 2 doses stopped as it caused hallucination and significant hyperglycemia     Thrombocytopenia  No signs of bleeding  Hematology consulted  Check HIV, hep C    Diabetes mellitus  Type II uncontrolled   Hyperglycemia above 400 likely due to steroid stop the steroid and increase Lantus to 20 units twice daily  Continue POCT fingerstick with lispro sliding scale  Hemoglobin A1c 11.8  Continue Lantus 20 units at bedtime.  Continue lispro 5 unit in addition to sliding scale 3 times a day before  meals    Generalized weakness  PT OT recommending SNF  Case management consult for placement    History of hypertension  Continue home medication with Coreg and amlodipine    Bilateral lower extremity edema  Check ultrasound of the lower extremity    Obesity class III   Advise wt loss , life style modification       Analgesia: Tylenol standing dose, oxycodone as needed    Nutrition: Renal and carb consistent diet     DVT Prophylaxis: SCDs no heparin due to severe thrombocytopenia       Code Status: Full code    DISPO: Patient declining SNF and would like to go home with home health she has a nurse that can check on her she is alert oriented x4 and able to make her own decisions         SUBJECTIVE     [name] report that her left wrist pain is much improved denies nausea vomiting abdominal pain denies headache    MEDICATIONS     Current Facility-Administered Medications   Medication Dose Route Frequency    acetaminophen  1,000 mg Oral Q8H    amLODIPine  10 mg Oral Daily    aspirin  81 mg Oral Daily    atorvastatin  40 mg Oral QHS    carvedilol  25 mg Oral Q12H SCH    gabapentin  600 mg Oral Daily    insulin glargine  20 Units Subcutaneous QHS    insulin glargine  20 Units Subcutaneous QAM    insulin lispro  1-6 Units Subcutaneous QHS    insulin lispro  2-10 Units Subcutaneous TID AC    insulin lispro  5 Units Subcutaneous TID AC    lidocaine  1 patch Transdermal Q24H    sevelamer  800 mg Oral TID MEALS    vitamin C  500 mg Oral Daily       PHYSICAL EXAM     Vitals:    10/23/20 1145   BP: 134/80   Pulse: 72   Resp: 21   Temp: 98.7 F (37.1 C)   SpO2: 97%       Temperature: Temp  Min: 97.5 F (36.4 C)  Max: 98.7 F (37.1 C)  Pulse: Pulse  Min: 65  Max: 82  Respiratory: Resp  Min: 14  Max: 22  Non-Invasive BP: BP  Min: 96/67  Max: 136/65  Pulse Oximetry SpO2  Min: 93 %  Max: 99 %    Intake and Output Summary (Last 24 hours) at Date Time    Intake/Output Summary (Last 24 hours) at 10/23/2020 1455  Last data  filed at 10/22/2020 1930  Gross per 24 hour   Intake --   Output 3000 ml   Net -3000 ml         GEN APPEARANCE: Normal;  A&OX3  HEENT: PERLA; EOMI; Conjunctiva Clear  NECK: Supple; No bruits  CVS: RRR, S1, S2; No M/G/R  LUNGS: CTAB; No Wheezes; No Rhonchi: No rales  ABD: Soft; No TTP; + Normoactive BS  EXT: Bilateral lower extremity edema, left wrist tenderness and decreased range of motion  Skin exam:  pink  NEURO: CN 2-12 intact; No Focal neurological deficits  CAP REFILL:  Normal  MENTAL STATUS:  Normal    Exam done by Dillard Essex, MD on 10/23/2020 at 2:55 PM       LABS     Recent Labs   Lab 10/23/20  1101 10/22/20  0652 10/21/20  0353   WBC 5.20 4.69 5.34   RBC 3.51* 3.24* 3.13*   Hgb 10.6* 9.7* 9.3*   Hematocrit 34.2* 32.2* 30.2*   MCV 97.4* 99.4* 96.5*   Platelets 93* 79* 67*         Recent Labs   Lab 10/23/20  1101 10/22/20  0652 10/21/20  0353 10/20/20  1157   Sodium 137 139 138 135   Potassium 4.7 4.0 4.2 4.7   Chloride 98* 103 102 100   CO2 '27 26 26 22   '$ BUN 36.0* 31.0* 37.0* 64.0*   Creatinine 4.0* 4.3* 5.2* 8.0*   Glucose 494* 218* 515* 476*   Calcium 8.4* 8.3* 7.8* 7.9*   Magnesium  --   --   --  2.0         Recent Labs   Lab 10/21/20  0353 10/20/20  1157   ALT 32 41   AST (SGOT) 12 22   Bilirubin, Total 0.6 0.5   Albumin 2.4* 2.7*   Alkaline Phosphatase 151* 186*         Recent Labs   Lab 10/21/20  1306 10/21/20  0353 10/20/20  1157   Creatine Kinase (CK) 33  --   --    Troponin I  --  0.35* 0.42*  Microbiology Results (last 15 days)       Procedure Component Value Units Date/Time    COVID-19 (SARS-CoV-2) only ([facility]) asymptomatic admission - Hospit[facility] [id number] Collected: 10/20/20 1318    Order Status: Completed Specimen: Nasopharyngeal Updated: 10/20/20 1351     Purpose of COVID testing Screening     SARS-CoV-2 Specimen Source Nasal Swab     SARS CoV 2 Overall Result Not Detected     Comment: __________________________________________________  -A result of "Detected" indicates  POSITIVE for the    presence of SARS CoV-2 RNA  -A result of "Not Detected" indicates NEGATIVE for the    presence of SARS CoV-2 RNA  __________________________________________________________  Test performed using the Roche cobas [facility] SARS-CoV-2 assay. This assay is  only for use under the Food and Drug Administration's Emergency Use  Authorization. This is a real-time RT-PCR assay for the qualitative  detection of SARS-CoV-2 RNA. Viral nucleic acids may persist in vivo,  independent of viability. Detection of viral nucleic acid does not imply the  presence of infectious virus, or that virus nucleic acid is the cause of  clinical symptoms. Negative results do not preclude SARS-CoV-2 infection and  should not be used as the sole basis for diagnosis, treatment or other  patient management decisions. Negative results must be combined with  clinical observations, patient history, and/or epidemiological information.  Invalid results may be due to inhibiting substances in the specimen and  recollection should occur. Please see Fact Sheets for patients and providers  located:  https://www.[employer].com/         Narrative:      o Collect and clearly label specimen type:  o PREFERRED-Upper respiratory specimen: One Nasal Swab in  Transport Media.  o Hand deliver to laboratory ASAP  Indication for testing->Extended care facility admission to  semi private room  Screening             RADIOLOGY     Upon my review:    Signed,  Dillard Essex, MD  2:55 PM 10/23/2020

## 2020-10-23 NOTE — Plan of Care (Addendum)
NURSING SHIFT NOTE     Patient: [name]  Day: 3      SHIFT EVENTS     Shift Narrative/Significant Events (PRN med administration, fall, RRT, etc.):     Pt A&Ox4, on room air, Nsr on tele, and stable at this time.    Pt medicated for pain throughout the shift.    Steroid stopped by MD after patient experienced hallucinations the night before and had BG of 512. Pt denied having any further hallucinations this shift. Glargine administered for hyperglycemia.    MD discontinued MRI of right wrist for rule out fracture due to patient's overall improvement of wrist and arm. Pt able to move arm and wrist and reports sensation.    Fall precautions in place. Grip socks applied. Bed locked and lowered with alarms on. Possessions, bed side table, call light, and tray table within reach. Pt instructed to call before attempting to exit bed. Demonstration and instruction of call light provided.      Safety and fall precautions remain in place. Purposeful rounding completed.          ASSESSMENT     Changes in assessment from patient's baseline this shift:    Neuro: No  CV: No  Pulm: No  Peripheral Vascular: No  HEENT: No  GI: No  BM during shift: No, Last BM: Last BM Date: 10/23/20  GU: No   Integ: No  MS: No    Pain: Improved  Pain Interventions: Medications  Medications Utilized: Roxicodone for pain of 5-6    Mobility: PMP Activity: Step 3 - Bed Mobility of Distance Walked (ft) (Step 6,7): 2 Feet           Lines     Patient Lines/Drains/Airways Status       Active Lines, Drains and Airways       Name Placement date Placement time Site Days    Peripheral IV 10/20/20 20 G Right Wrist 10/20/20  1211  Wrist  3    External Urinary Catheter 10/21/20  1000  --  2    Graft/Fistula Left --  --  -- --                         VITAL SIGNS     Vitals:    10/23/20 2005   BP: 129/65   Pulse: 70   Resp: 14   Temp: 97.5 F (36.4 C)   SpO2: 97%       Temp  Min: 97.5 F (36.4 C)  Max: 98.7 F (37.1 C)  Pulse  Min: 66  Max: 82  Resp   Min: 14  Max: 22  BP  Min: 111/57  Max: 134/80  SpO2  Min: 93 %  Max: 99 %    No intake or output data in the 24 hours ending 10/23/20 2104         CARE PLAN   FOUR EYES SKIN ASSESSMENT NOTE    [name]  1955/03/18  [id number]    Braden Scale Score: 26    POC Initiated for Risk for Altered Skin No      Patient Assessed for Correct Mattress Surface No    *At risk patients with Braden Score less than 12 must be considered for specialty bed    Mepilex or Adhesive Foam Dressing applied to sacrum/heel if any PI risk factors present: Yes      If Wound/Pressure Injury present:  Wound/PI assessment documented  in [facility] (will be shown below): No    Admitting physician notified: No    Wound consult ordered: No        Was skin checked with incoming RN/PCT? Yes    Janith Lima, RN  October 23, 2020      Second RN/PCT Name: Ander Purpura                                 Problem: Moderate/High Fall Risk Score >5  Goal: Patient will remain free of falls  Outcome: Progressing  Flowsheets  Taken 10/23/2020 0800 by Janith Lima, RN  High (Greater than 13):   HIGH-Consider use of low bed   HIGH-Apply yellow "Fall Risk" arm band  Taken 10/20/2020 1122 by Dwaine Gale, RN  Moderate Risk (6-13): MOD-Remain with patient during toileting     Problem: Renal Instability  Goal: Fluid and electrolyte balance are achieved/maintained  Outcome: Progressing  Flowsheets (Taken 10/22/2020 2340 by Blase Mess, RN)  Fluid and electrolyte balance are achieved/maintained:   Monitor intake and output every shift   Monitor/assess lab values and report abnormal values   Monitor daily weight     Problem: Patient Receiving Advanced Renal Therapies  Goal: Therapy access site remains intact  Outcome: Progressing  Flowsheets (Taken 10/22/2020 1621 by Erin Hearing, RN)  Therapy access site remains intact:   Assess therapy access site   Change therapy access site dressing as needed     Problem: Safety  Goal: Patient will be free from injury during  hospitalization  Outcome: Progressing  Flowsheets (Taken 10/21/2020 2053)  Patient will be free from injury during hospitalization:   Hourly rounding   Assess patient's risk for falls and implement fall prevention plan of care per policy  Goal: Patient will be free from infection during hospitalization  Outcome: Progressing  Flowsheets (Taken 10/23/2020 2102)  Free from Infection during hospitalization:   Assess and monitor for signs and symptoms of infection   Monitor lab/diagnostic results   Monitor all insertion sites (i.e. indwelling lines, tubes, urinary catheters, and drains)     Problem: Pain  Goal: Pain at adequate level as identified by patient  Outcome: Progressing  Flowsheets (Taken 10/21/2020 2305 by Garry Heater, Angela Nevin, RN)  Pain at adequate level as identified by patient:   Identify patient comfort function goal   Reassess pain within 30-60 minutes of any procedure/intervention, per Pain Assessment, Intervention, Reassessment (AIR) Cycle     Problem: Psychosocial and Spiritual Needs  Goal: Demonstrates ability to cope with hospitalization/illness  Outcome: Progressing  Flowsheets (Taken 10/23/2020 2102)  Demonstrates ability to cope with hospitalizations/illness:   Encourage verbalization of feelings/concerns/expectations   Provide quiet environment   Assist patient to identify own strengths and abilities

## 2020-10-23 NOTE — Progress Notes (Signed)
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 difficile colitis  Elevated ALK phos  Elevated troponin  Hypocalcemia- improving  Severe left wrist pain with severe bilateral hip pain- after a fall at home- No fracture on recent imaging, resolved        Plan:  Pending ANA/ Heptoglobin is 225- rules out hemolysis  Platelets are above threshold of intervention- response to steroids confirms daignosis  [place] abd shows normal spleen size- consistent with ITP  Monitor H/H and Platelets  Iron replete. Retic 1.7. Pending ferritin- continue iron repletion with dialysis and ESA therapy  Started on Prednisone 20 mg daily x 5 days for wrist pain per Neph- stopped after 3 dosese due to hallucinations  Continue home meds for comorbid conditions         Cindi Carbon, MD    10/23/2020  10:15 PM       SUBJECTIVE:   Her left wrist pain is much improved  Denies cp or sob  No nausea, vomiting, abdominal pain    Heptoglobin 225, Retic count 1.7    9/30- CT cervical spine- No evidence of fracture. Endplate and facet joint degenerative changes throughout cervical spine      OBJECTIVE       Visit Vitals  BP 129/65   Pulse 70   Temp 97.5 F (36.4 C) (Oral)   Resp 14   Ht 1.575 m ('5\' 2"'$ )   Wt 108.1 kg (238 lb 5.1 oz)   LMP  (LMP Unknown)   SpO2 97%   BMI 43.59 kg/m       Body mass index is 43.59 kg/m.    No intake or output data in the 24 hours ending 10/23/20 2215       REVIEW OF SYSTEMS     Per narrative, 2 system review was done.       PHYSICAL EXAM      General: Obese  Skin: no rashes or lesions.  Eye: PERRL, EOMI  HENT: normocephalic, moist oral mucosa, no scleral icterus  Neck: supple, no lymphadenopathy.  Lungs: non-labored respiration.  Heart: normal rate, regular rhythm, no murmur.  Abdomen: soft, non-tender, no masses.  Musculoskeletal: Tenderness over the dorsum of her left distal forearm and entire left lateral upper leg. no swelling.  Lymphatic: B/L lower leg edema, no clubbing   Neurologic: awake, alert, and oriented X3, non-focal exam.     MEDICATIONS      Scheduled Meds: PRN Meds:    acetaminophen, 1,000 mg, Oral, Q8H  amLODIPine, 10 mg, Oral, Daily  aspirin, 81 mg, Oral, Daily  atorvastatin, 40 mg, Oral, QHS  carvedilol, 25 mg, Oral, Q12H SCH  gabapentin, 600 mg, Oral, Daily  insulin glargine, 20 Units, Subcutaneous, QHS  insulin glargine, 20 Units, Subcutaneous, QAM  insulin lispro, 1-6 Units, Subcutaneous, QHS  insulin lispro, 2-10 Units, Subcutaneous, TID AC  insulin lispro, 5 Units, Subcutaneous, TID AC  lidocaine, 1 patch, Transdermal, Q24H  sevelamer, 800 mg, Oral, TID  MEALS  vitamin C, 500 mg, Oral, Daily       glucagon (rDNA), 1 mg, PRN   And  dextrose, 250 mL, PRN   And  dextrose, 25 g, PRN   And  dextrose, 25 g, PRN  hydrALAZINE, 10 mg, Q6H PRN  melatonin, 3 mg, QHS PRN  naloxone, 0.2 mg, PRN  oxyCODONE, 5 mg, Q4H PRN         Home Meds:      Home Medications       Med List Status: Complete Set By: Blase Mess, RN at 10/20/2020  9:48 PM      Status Comment        10/20/2020  9:49 PM     Pt reports taking other medication, but unsure of which              amLODIPine (NORVASC) 10 MG tablet     Take 1 tablet (10 mg total) by mouth daily     aspirin 81 MG chewable tablet     Chew 81 mg by mouth daily     carvedilol (COREG) 25 MG tablet     Take 1 tablet by mouth daily     gabapentin (NEURONTIN) 600 MG tablet     Take 1 tablet by mouth daily     sevelamer (RENVELA) 800 MG tablet     Take 800 mg by mouth 3 (three) times daily with meals      vitamin C (ASCORBIC ACID) 500 MG tablet     Take 1 tablet (500 mg total) by mouth daily                                                                   Flagged for Removal               atorvastatin (LIPITOR) 40 MG tablet     Take 40 mg by mouth daily                 I personally reviewed all of the medications       LABS     Recent Labs   Lab 10/23/20  1101 10/22/20  0652 10/21/20  0353 10/20/20  1157   Glucose 494* 218* 515* 476*   BUN 36.0* 31.0* 37.0* 64.0*   Creatinine 4.0* 4.3* 5.2* 8.0*   EGFR 13.6 12.5 10.0 6.1   Calcium 8.4* 8.3* 7.8* 7.9*   Sodium 137 139 138 135   Potassium 4.7 4.0 4.2 4.7   Chloride 98* 103 102 100   CO2 '27 26 26 22   '$ Albumin  --   --  2.4* 2.7*   Phosphorus  --   --   --  5.4*   Magnesium  --   --   --  2.0   AST (SGOT)  --   --  12 22   ALT  --   --  32 41   Bilirubin, Total  --   --  0.6 0.5   Alkaline Phosphatase  --   --  151* 186*       Recent Labs   Lab 10/23/20  1101 10/22/20  0652 10/21/20  0353   WBC 5.20 4.69 5.34   Hgb 10.6* 9.7* 9.3*  Hematocrit 34.2* 32.2* 30.2*   MCV 97.4* 99.4* 96.5*   MCH 30.2 29.9 29.7   MCHC 31.0* 30.1* 30.8*   Platelets 93* 79* 67*       Band Neutrophils Absolute   Date Value Ref Range Status   01/05/2020 0.00 0.00 - 1.00 x10 3/uL Final       Iron   Date Value Ref Range Status   10/22/2020 41 40 - 145 ug/dL Final   01/05/2020 50 40 - 145 ug/dL Final     TIBC   Date Value Ref Range Status   10/22/2020 166 (L) 265 - 497 ug/dL Final   01/05/2020 188 (L) 265 - 497 ug/dL Final     Ferritin   Date Value Ref Range Status   12/31/2019 3,158.40 (H) 4.60 - 204.00 ng/mL Final   05/13/2019 1,070.90 (H) 4.60 - 204.00 ng/mL Final     Folate   Date Value Ref Range Status   10/22/2020 7.2 See below ng/mL Final     Comment:     Deficient    : <3.5 ng/mL  Intermediate : 3.5 - 5.4 ng/mL  Normal       : Greater Than 5.4 ng/mL     01/05/2020 8.3 See below ng/mL Final     Comment:     Deficient    : <3.5 ng/mL  Intermediate : 3.5 - 5.4 ng/mL  Normal       : Greater  Than 5.4 ng/mL       Vitamin B-12   Date Value Ref Range Status   10/22/2020 674 211 - 911 pg/mL Final   01/05/2020 850 211 - 911 pg/mL Final     Haptoglobin   Date Value Ref Range Status   10/23/2020 225 35 - 250 mg/dL Final     LDH   Date Value Ref Range Status   10/22/2020 114 (L) 120 - 331 U/L Final     Reticulocyte Count Automated   Date Value Ref Range Status   10/23/2020 1.7 0.8 - 2.3 % Final     Reticulocyte Count Absolute   Date Value Ref Range Status   10/23/2020 0.0593 0.0220 - 0.1420 x10 6/uL Final       No results for input(s): PTT, PT, INR in the last 72 hours.        Microbiology:  Microbiology Results (last 15 days)       Procedure Component Value Units Date/Time    COVID-19 (SARS-CoV-2) only ([facility]) asymptomatic admission - [facility] [id number] Collected: 10/20/20 1318    Order Status: Completed Specimen: Nasopharyngeal Updated: 10/20/20 1351     Purpose of COVID testing Screening     SARS-CoV-2 Specimen Source Nasal Swab     SARS CoV 2 Overall Result Not Detected     Comment: __________________________________________________  -A result of "Detected" indicates POSITIVE for the    presence of SARS CoV-2 RNA  -A result of "Not Detected" indicates NEGATIVE for the    presence of SARS CoV-2 RNA  __________________________________________________________  Test performed using the Roche cobas [facility] SARS-CoV-2 assay. This assay is  only for use under the Food and Drug Administrations Emergency Use  Authorization. This is a real-time RT-PCR assay for the qualitative  detection of SARS-CoV-2 RNA. Viral nucleic acids may persist in vivo,  independent of viability. Detection of viral nucleic acid does not imply the  presence of infectious virus, or that virus nucleic acid is the cause of  clinical symptoms. Negative results do not  preclude SARS-CoV-2 infection and  should not be used as the sole basis for diagnosis, treatment or other  patient management decisions. Negative results must be combined  with  clinical observations, patient history, and/or epidemiological information.  Invalid results may be due to inhibiting substances in the specimen and  recollection should occur. Please see Fact Sheets for patients and providers  located:  https://www.[employer].com/         Narrative:      o Collect and clearly label specimen type:  o PREFERRED-Upper respiratory specimen: One Nasal Swab in  Transport Media.  o Hand deliver to laboratory ASAP  Indication for testing->Extended care facility admission to  semi private room  Screening             IMAGING     Diagnostic Imaging:  XR Hip left 1 vw with pelvis    Result Date: 10/20/2020  No radiographically apparent displaced fracture is present. Please note that decreased bony mineralization can limit evaluation for nondisplaced fractures. Vernie Shanks, MD  10/20/2020 1:27 PM    XR Chest PA Or AP    Result Date: 10/20/2020  No significant interval change of diffuse interstitial markings, suggestive of pulmonary vascular congestion. Elnita Maxwell, MD  10/20/2020 1:19 PM    Forearm Complete Left    Result Date: 10/20/2020  1. No acute fracture. 2. Diffuse soft tissue swelling, nonspecific. 3. Diffuse osteopenia. 4. Peripheral vascular disease. Lonia Skinner, MD  10/20/2020 1:11 PM    Wrist Left PA Lateral and Oblique    Result Date: 10/20/2020  1. No acute fracture. 2. Diffuse soft tissue swelling, nonspecific. 3. Diffuse osteopenia. 4. Peripheral vascular disease. Lonia Skinner, MD  10/20/2020 1:11 PM    CT Head WO Contrast    Result Date: 10/21/2020   No currently detectable acute intracranial hemorrhage or mass effect or edema. Mildly suboptimal study for detection of small amounts of hemorrhage due to multiple regions of beam hardening artifact in the periphery of the frontal lobes.. Note: Note that CT scanning at this site  utilizes multiple dose reduction techniques including automatic exposure control, adjustment of the MAA and/or KVP according to patient's  size and use of iterative reconstruction technique Elyn Peers, MD  10/21/2020 8:16 AM    CT Cervical Spine WO Contrast    Result Date: 10/23/2020   1. No evidence of fracture. 2. Endplate and facet joint degenerative changes throughout cervical spine as described above. Raphael Gibney, DO  10/23/2020 7:39 AM    US Abdomen Complete    Result Date: 10/22/2020   1. Hepatomegaly. No evidence of splenomegaly. Earney Navy, MD  10/22/2020 10:02 AM    CT Boney Pelvis    Result Date: 10/22/2020   No CT evidence for fracture or dislocation of the pelvis or either hip. Edison Simon, MD  10/22/2020 7:44 AM    CT Forearm Left WO Contrast    Result Date: 10/22/2020   No gross CT evidence for fracture or dislocation of the left wrist, radius, or ulna within study limitations as described above. However, nondisplaced fractures cannot be excluded. If there is persistent clinical concern for wrist or elbow fracture, further evaluation with MRI is recommended Edison Simon, MD  10/22/2020 7:38 AM    CT Wrist Left WO Contrast    Result Date: 10/22/2020   No gross CT evidence for fracture or dislocation of the left wrist, radius, or ulna within study limitations as described above. However, nondisplaced fractures cannot be excluded. If there is  persistent clinical concern for wrist or elbow fracture, further evaluation with MRI is recommended Edison Simon, MD  10/22/2020 7:38 AM    US Venous Low Extrem Duplx Dopp Comp Bilat    Result Date: 10/22/2020   NORMAL VENOUS DUPLEX OF THE LOWER EXTREMITIES. NO EVIDENCE OF INTRALUMINAL THROMBUS OR OBSTRUCTION TO VENOUS FLOW IN RIGHT OR LEFT LOWER EXTREMITY. Nelma Rothman, MD  10/22/2020 12:35 PM

## 2020-10-24 LAB — GLUCOSE WHOLE BLOOD - POCT
Whole Blood Glucose POCT: 403 mg/dL — ABNORMAL HIGH (ref 70–100)
Whole Blood Glucose POCT: 288 mg/dL — ABNORMAL HIGH (ref 70–100)
Whole Blood Glucose POCT: 287 mg/dL — ABNORMAL HIGH (ref 70–100)

## 2020-10-24 LAB — BASIC METABOLIC PANEL
Anion Gap: 13 (ref 5.0–15.0)
BUN: 69 mg/dL — ABNORMAL HIGH (ref 7.0–21.0)
CO2: 24 mEq/L (ref 17–29)
Calcium: 8.3 mg/dL — ABNORMAL LOW (ref 8.5–10.5)
Chloride: 99 mEq/L (ref 99–111)
Creatinine: 5.7 mg/dL — ABNORMAL HIGH (ref 0.4–1.0)
Glucose: 482 mg/dL — ABNORMAL HIGH (ref 70–100)
Potassium: 4.8 mEq/L (ref 3.5–5.3)
Sodium: 136 mEq/L (ref 135–145)

## 2020-10-24 LAB — CBC AND DIFFERENTIAL
Absolute NRBC: 0 10*3/uL (ref 0.00–0.00)
Basophils Absolute Automated: 0.02 10*3/uL (ref 0.00–0.08)
Basophils Automated: 0.3 %
Eosinophils Absolute Automated: 0.03 10*3/uL (ref 0.00–0.44)
Eosinophils Automated: 0.4 %
Hematocrit: 32.9 % — ABNORMAL LOW (ref 34.7–43.7)
Hgb: 10 g/dL — ABNORMAL LOW (ref 11.4–14.8)
Immature Granulocytes Absolute: 0.03 10*3/uL (ref 0.00–0.07)
Immature Granulocytes: 0.4 %
Lymphocytes Absolute Automated: 1.24 10*3/uL (ref 0.42–3.22)
Lymphocytes Automated: 18 %
MCH: 29.5 pg (ref 25.1–33.5)
MCHC: 30.4 g/dL — ABNORMAL LOW (ref 31.5–35.8)
MCV: 97.1 fL — ABNORMAL HIGH (ref 78.0–96.0)
MPV: 12.7 fL — ABNORMAL HIGH (ref 8.9–12.5)
Monocytes Absolute Automated: 0.59 10*3/uL (ref 0.21–0.85)
Monocytes: 8.6 %
Neutrophils Absolute: 4.98 10*3/uL (ref 1.10–6.33)
Neutrophils: 72.3 %
Nucleated RBC: 0 /100 WBC (ref 0.0–0.0)
Platelets: 111 10*3/uL — ABNORMAL LOW (ref 142–346)
RBC: 3.39 10*6/uL — ABNORMAL LOW (ref 3.90–5.10)
RDW: 13 % (ref 11–15)
WBC: 6.89 10*3/uL (ref 3.10–9.50)

## 2020-10-24 LAB — GFR: EGFR: 9

## 2020-10-24 MED ORDER — INSULIN GLARGINE 100 UNIT/ML SC SOLN
25.0000 [IU] | Freq: Two times a day (BID) | SUBCUTANEOUS | Status: DC
Start: 2020-10-24 — End: 2020-10-25
  Administered 2020-10-24 – 2020-10-25 (×2): 25 [IU] via SUBCUTANEOUS
  Filled 2020-10-24 (×2): qty 25

## 2020-10-24 NOTE — Progress Notes (Signed)
Hospitalist Progress note       Patient: [name]  Date: 10/24/2020   LOS: 4 Days  Admission Date: 10/20/2020   MRN: [id number]  Attending: Dillard Essex, MD  Please contact me on epic chat        ASSESSMENT/PLAN     [name] is a 65 y.o. female admitted with Shortness of breath    Interval Summary:       Patient Active Hospital Problem List:    Shortness of breath and pulmonary edema  resolved   Chest x-ray with  interstitial markings suggestive of pulmonary vascular congestion.  likely due to missed dialysis  Status post dialysis on 10/20/2020 , 10/21/2020  and 9/30 appreciate nephrology consult    Elevated troponin possibly demand ischemia in the setting of end-stage renal disease and pulmonary edema due to missed dialysis   Ruled out ACS  Patient denied chest pain  Echocardiogram done on 07/21/2019 shows normal EF with 65 to 70% with mild mitral regurgitation small to moderate circumferential pericardial effusion  [facility] consult greatly appreciated no further testing required follow-up as an outpatient  ASA 81 mg daily     Severe left wrist pain resolved   bilateral hip pain resolved more on the left after a fall at home.  Rule out fracture  Reviewed all x-ray no evidence of fracture but shows osteopenia unable to rule out nondisplaced fracture   CT scan of the left wrist no evidence of a fracture  Cancelled MRI of the left wrist given significant improvement in symptoms   S/P prednisone 20 mg daily X 2 doses stopped as it caused hallucination and significant hyperglycemia     Thrombocytopenia  No signs of bleeding  Hematology consulted  Check HIV, hep C    Diabetes mellitus  Type II uncontrolled   Hyperglycemia above 400 likely due to steroid stop the steroid and increase Lantus to 25 units twice daily  Continue POCT fingerstick with lispro sliding scale  Hemoglobin A1c 11.8  Continue Lantus and increased to 20 units Q12 hours and will further increase to 25 units Q 12   Continue  lispro 5 unit in addition to sliding scale 3 times a day before meals    Generalized weakness  PT OT recommending SNF  Case management consult for placement    History of hypertension  Continue home medication with Coreg and amlodipine    Bilateral lower extremity edema  Check ultrasound of the lower extremity    Obesity class III   Advise wt loss , life style modification       Analgesia: Tylenol standing dose, oxycodone as needed    Nutrition: Renal and carb consistent diet     DVT Prophylaxis: SCDs no heparin due to severe thrombocytopenia       Code Status: Full code    DISPO: Patient declining SNF and would like to go home with home health she has a nurse that can check on her she is alert oriented x4 and able to make her own decisions   Likely discharge on 10/25/20 after dialysis if the Blood glucose is better controlled.        SUBJECTIVE     [name] report that her left wrist pain is much improved denies nausea vomiting abdominal pain denies headache    MEDICATIONS     Current Facility-Administered Medications   Medication Dose Route Frequency    acetaminophen  1,000 mg Oral Q8H    amLODIPine  10 mg Oral Daily    aspirin  81 mg Oral Daily    atorvastatin  40 mg Oral QHS    carvedilol  25 mg Oral Q12H SCH    gabapentin  600 mg Oral Daily    insulin glargine  25 Units Subcutaneous Q12H    insulin lispro  1-6 Units Subcutaneous QHS    insulin lispro  2-10 Units Subcutaneous TID AC    insulin lispro  5 Units Subcutaneous TID AC    lidocaine  1 patch Transdermal Q24H    sevelamer  800 mg Oral TID MEALS    vitamin C  500 mg Oral Daily       PHYSICAL EXAM     Vitals:    10/24/20 1720   BP: 113/58   Pulse: 67   Resp: 18   Temp: 97.7 F (36.5 C)   SpO2: 97%       Temperature: Temp  Min: 97.5 F (36.4 C)  Max: 98.2 F (36.8 C)  Pulse: Pulse  Min: 65  Max: 80  Respiratory: Resp  Min: 14  Max: 20  Non-Invasive BP: BP  Min: 108/69  Max: 150/67  Pulse Oximetry SpO2  Min: 92 %  Max: 97 %    Intake and Output  Summary (Last 24 hours) at Date Time    Intake/Output Summary (Last 24 hours) at 10/24/2020 1940  Last data filed at 10/24/2020 1118  Gross per 24 hour   Intake 236 ml   Output --   Net 236 ml         GEN APPEARANCE: Normal;  A&OX3  HEENT: PERLA; EOMI; Conjunctiva Clear  NECK: Supple; No bruits  CVS: RRR, S1, S2; No M/G/R  LUNGS: CTAB; No Wheezes; No Rhonchi: No rales  ABD: Soft; No TTP; + Normoactive BS  EXT: Bilateral lower extremity edema,  Skin exam:  pink  NEURO: CN 2-12 intact; No Focal neurological deficits  CAP REFILL:  Normal  MENTAL STATUS:  Normal    Exam done by Dillard Essex, MD on 10/24/2020 at 7:40 PM       LABS     Recent Labs   Lab 10/24/20  1028 10/23/20  1101 10/22/20  0652   WBC 6.89 5.20 4.69   RBC 3.39* 3.51* 3.24*   Hgb 10.0* 10.6* 9.7*   Hematocrit 32.9* 34.2* 32.2*   MCV 97.1* 97.4* 99.4*   Platelets 111* 93* 79*         Recent Labs   Lab 10/24/20  1028 10/23/20  1101 10/22/20  0652 10/21/20  0353 10/20/20  1157   Sodium 136 137 139 138 135   Potassium 4.8 4.7 4.0 4.2 4.7   Chloride 99 98* 103 102 100   CO2 '24 27 26 26 22   '$ BUN 69.0* 36.0* 31.0* 37.0* 64.0*   Creatinine 5.7* 4.0* 4.3* 5.2* 8.0*   Glucose 482* 494* 218* 515* 476*   Calcium 8.3* 8.4* 8.3* 7.8* 7.9*   Magnesium  --   --   --   --  2.0         Recent Labs   Lab 10/21/20  0353 10/20/20  1157   ALT 32 41   AST (SGOT) 12 22   Bilirubin, Total 0.6 0.5   Albumin 2.4* 2.7*   Alkaline Phosphatase 151* 186*         Recent Labs   Lab 10/21/20  1306 10/21/20  0353 10/20/20  1157   Creatine Kinase (CK) 33  --   --  Troponin I  --  0.35* 0.42*               Microbiology Results (last 15 days)       Procedure Component Value Units Date/Time    COVID-19 (SARS-CoV-2) only ([facility]) asymptomatic admission - Hospitals [id number] Collected: 10/20/20 1318    Order Status: Completed Specimen: Nasopharyngeal Updated: 10/20/20 1351     Purpose of COVID testing Screening     SARS-CoV-2 Specimen Source Nasal Swab     SARS CoV 2 Overall Result Not  Detected     Comment: __________________________________________________  -A result of "Detected" indicates POSITIVE for the    presence of SARS CoV-2 RNA  -A result of "Not Detected" indicates NEGATIVE for the    presence of SARS CoV-2 RNA  __________________________________________________________  Test performed using the Roche cobas [facility] SARS-CoV-2 assay. This assay is  only for use under the Food and Drug Administration's Emergency Use  Authorization. This is a real-time RT-PCR assay for the qualitative  detection of SARS-CoV-2 RNA. Viral nucleic acids may persist in vivo,  independent of viability. Detection of viral nucleic acid does not imply the  presence of infectious virus, or that virus nucleic acid is the cause of  clinical symptoms. Negative results do not preclude SARS-CoV-2 infection and  should not be used as the sole basis for diagnosis, treatment or other  patient management decisions. Negative results must be combined with  clinical observations, patient history, and/or epidemiological information.  Invalid results may be due to inhibiting substances in the specimen and  recollection should occur. Please see Fact Sheets for patients and providers  located:  https://www.[employer].com/         Narrative:      o Collect and clearly label specimen type:  o PREFERRED-Upper respiratory specimen: One Nasal Swab in  Transport Media.  o Hand deliver to laboratory ASAP  Indication for testing->Extended care facility admission to  semi private room  Screening             RADIOLOGY     Upon my review:    Signed,  Dillard Essex, MD  7:40 PM 10/24/2020

## 2020-10-24 NOTE — Plan of Care (Signed)
NURSING SHIFT NOTE     Patient: [name]  Day: 4      SHIFT EVENTS     Shift Narrative/Significant Events (PRN med administration, fall, RRT, etc.):     Pt A&Ox4, on room air, Nsr on tele, and stable at this time.     Pt medicated for pain throughout the shift.    Rn stood patient up and walked her 4 steps before pt became dizzy. MD notified. RN held further administrations of Roxicodone and suggested ice packs to patient. NIH remained at baseline.     Fall precautions in place. Grip socks applied. Bed locked and lowered with alarms on. Possessions, bed side table, call light, and tray table within reach. Pt instructed to call before attempting to exit bed. Demonstration and instruction of call light provided.    Safety and fall precautions remain in place. Purposeful rounding completed.          ASSESSMENT     Changes in assessment from patient's baseline this shift:    Neuro: Yes dizziness  CV: No  Pulm: No  Peripheral Vascular: No  HEENT: No  GI: No  BM during shift: No, Last BM: Last BM Date: 10/22/20  GU: No   Integ: No  MS: No    Pain: None  Pain Interventions: ice  Medications Utilized: Tylenol    Mobility: PMP Activity: Step 3 - Bed Mobility, Step 6 - Walks in Room of Distance Walked (ft) (Step 6,7): 2 Feet           Lines     Patient Lines/Drains/Airways Status       Active Lines, Drains and Airways       Name Placement date Placement time Site Days    Peripheral IV 10/20/20 20 G Right Wrist 10/20/20  1211  Wrist  4    External Urinary Catheter 10/21/20  1000  --  3    Graft/Fistula Left --  --  -- --                         VITAL SIGNS     Vitals:    10/24/20 1720   BP: 113/58   Pulse: 67   Resp: 18   Temp: 97.7 F (36.5 C)   SpO2: 97%       Temp  Min: 97.5 F (36.4 C)  Max: 98.2 F (36.8 C)  Pulse  Min: 65  Max: 80  Resp  Min: 18  Max: 20  BP  Min: 108/69  Max: 150/67  SpO2  Min: 92 %  Max: 97 %      Intake/Output Summary (Last 24 hours) at 10/24/2020 2033  Last data filed at 10/24/2020  1118  Gross per 24 hour   Intake 236 ml   Output --   Net 236 ml          CARE PLAN     FOUR EYES SKIN ASSESSMENT NOTE    [name]  [birth year]  [id number]    Braden Scale Score: 50    POC Initiated for Risk for Altered Skin No      Patient Assessed for Correct Mattress Surface No    *At risk patients with Braden Score less than 12 must be considered for specialty bed    Mepilex or Adhesive Foam Dressing applied to sacrum/heel if any PI risk factors present: Yes      If Wound/Pressure Injury present:  Wound/PI assessment  documented in [facility] (will be shown below): No    Admitting physician notified: No    Wound consult ordered: No        Was skin checked with incoming RN/PCT? Yes    Janith Lima, RN  October 24, 2020  8:38 PM    Second RN/PCT Name: Mickel Baas                                   Problem: Renal Instability  Goal: Fluid and electrolyte balance are achieved/maintained  Outcome: Progressing  Flowsheets (Taken 10/22/2020 2340 by Blase Mess, RN)  Fluid and electrolyte balance are achieved/maintained:   Monitor intake and output every shift   Monitor/assess lab values and report abnormal values   Monitor daily weight     Problem: Patient Receiving Advanced Renal Therapies  Goal: Therapy access site remains intact  Outcome: Progressing  Flowsheets (Taken 10/22/2020 1621 by Erin Hearing, RN)  Therapy access site remains intact:   Assess therapy access site   Change therapy access site dressing as needed     Problem: Pain  Goal: Pain at adequate level as identified by patient  Outcome: Progressing  Flowsheets (Taken 10/21/2020 2305 by Blase Mess, RN)  Pain at adequate level as identified by patient:   Identify patient comfort function goal   Reassess pain within 30-60 minutes of any procedure/intervention, per Pain Assessment, Intervention, Reassessment (AIR) Cycle     Problem: Side Effects from Pain Analgesia  Goal: Patient will experience minimal side effects of analgesic therapy  Outcome: Not  Progressing  Flowsheets (Taken 10/24/2020 2030)  Patient will experience minimal side effects of analgesic therapy:   Monitor/assess patient's respiratory status (RR depth, effort, breath sounds)   Assess for changes in cognitive function   Evaluate for opioid-induced sedation with appropriate assessment tool (i.e. POSS)  Note: Pt displaying dizziness     Problem: Every Day - Stroke  Goal: Core/Quality measure requirements - Daily  Outcome: Progressing  Flowsheets (Taken 10/21/2020 2305 by Blase Mess, RN)  Core/Quality measure requirements - Daily: Continue stroke education (must include Modifiable Risk Factors, Warning Signs and Symptoms of Stroke, Activation of Emergency Medical System and Follow-up Appointments). Ensure handout has been given and documented.  Goal: Neurological status is stable or improving  Outcome: Progressing  Flowsheets (Taken 10/22/2020 2340 by Blase Mess, RN)  Neurological status is stable or improving:   Monitor/assess/document neurological assessment (Stroke: every 4 hours)   Monitor/assess NIH Stroke Scale  Goal: Stable vital signs and fluid balance  Outcome: Progressing  Flowsheets (Taken 10/22/2020 2340 by Blase Mess, RN)  Stable vital signs and fluid balance:   Position patient for maximum circulation/cardiac output   Monitor and assess vitals every 4 hours or as ordered and hemodynamic parameters  Goal: Patient will maintain adequate oxygenation  Outcome: Progressing  Flowsheets (Taken 10/21/2020 2053)  Patient will maintain adequate oxygenation: Maintain SpO2 of greater than 92%  Goal: Patient's risk of aspiration will be minimized  Outcome: Progressing  Flowsheets (Taken 10/22/2020 2340 by Blase Mess, RN)  Patient's risk of aspiration will be minimized: Monitor/assess for signs of aspiration (tachypnea, cough, wheezing, clearing throat, hoarseness after eating, decrease in SaO2  Goal: Nutritional intake is adequate  Outcome: Progressing  Flowsheets (Taken  10/21/2020 2053)  Nutritional intake is adequate: Allow adequate time for meals  Goal: Elimination patterns are normal or improving  Outcome: Progressing  Flowsheets (Taken 10/22/2020 2023)  Elimination patterns are normal or improving:   Anticipate/assist with toileting needs   Assess for normal bowel sounds  Goal: Mobility/Activity is maintained at optimal level for patient  Outcome: Progressing  Flowsheets (Taken 10/22/2020 2023)  Mobility/activity is maintained at optimal level for patient:   Encourage independent activity per ability   Increase mobility as tolerated/progressive mobility   Maintain proper body alignment   Consult/collaborate with Physical Therapy and/or Occupational Therapy  Goal: Skin integrity is maintained or improved  Outcome: Progressing  Flowsheets (Taken 10/21/2020 2053)  Skin integrity is maintained or improved:   Assess Braden Scale every shift   Turn or reposition patient every 2 hours or as needed unless able to reposition self   Keep skin clean and dry  Goal: Neurovascular status is stable or improving  Outcome: Progressing  Flowsheets (Taken 10/22/2020 2340 by Blase Mess, RN)  Neurovascular status is stable or improving:   Monitor/assess neurovascular status (pulses, capillary refill, pain, paresthesia, presence of edema)   Monitor/assess for signs of Venous Thrombus Emboli (edema of calf/thigh redness, pain)  Goal: Effective coping demonstrated  Outcome: Progressing  Flowsheets (Taken 10/21/2020 2053)  Effective coping demonstrated: Offer reassurance to decrease anxiety  Goal: Will be able to express needs and understand communication  Outcome: Progressing  Flowsheets (Taken 10/21/2020 2053)  Able to express needs and understand communication: Include patient care companion in decisions related to communication     Problem: Day of Discharge - Stroke  Goal: Able to express understanding of discharge instructions and stroke education  Outcome: Progressing  [place] (Taken 10/21/2020  2053)  Able to express understanding of discharge instructions and stroke education: Provide alternative method of communication if needed  Goal: Core/Quality measures - Discharge  Outcome: Progressing  Flowsheets (Taken 10/21/2020 2053)  Core/Quality measures - Discharge: Document discharge NIH Stroke Scale

## 2020-10-24 NOTE — Plan of Care (Signed)
Labs reviewed.   Remains hyperglycemic.   HD tomorrow per routine schedule if still inpatient.   Debroah Baller, MD  [facility]

## 2020-10-25 LAB — CBC AND DIFFERENTIAL
Absolute NRBC: 0 10*3/uL (ref 0.00–0.00)
Basophils Absolute Automated: 0.05 10*3/uL (ref 0.00–0.08)
Basophils Automated: 0.7 %
Eosinophils Absolute Automated: 0.12 10*3/uL (ref 0.00–0.44)
Eosinophils Automated: 1.7 %
Hematocrit: 32.8 % — ABNORMAL LOW (ref 34.7–43.7)
Hgb: 10 g/dL — ABNORMAL LOW (ref 11.4–14.8)
Immature Granulocytes Absolute: 0.06 10*3/uL (ref 0.00–0.07)
Immature Granulocytes: 0.9 %
Lymphocytes Absolute Automated: 2.21 10*3/uL (ref 0.42–3.22)
Lymphocytes Automated: 31.8 %
MCH: 30.4 pg (ref 25.1–33.5)
MCHC: 30.5 g/dL — ABNORMAL LOW (ref 31.5–35.8)
MCV: 99.7 fL — ABNORMAL HIGH (ref 78.0–96.0)
MPV: 12.2 fL (ref 8.9–12.5)
Monocytes Absolute Automated: 0.65 10*3/uL (ref 0.21–0.85)
Monocytes: 9.4 %
Neutrophils Absolute: 3.85 10*3/uL (ref 1.10–6.33)
Neutrophils: 55.5 %
Nucleated RBC: 0 /100 WBC (ref 0.0–0.0)
Platelets: 132 10*3/uL — ABNORMAL LOW (ref 142–346)
RBC: 3.29 10*6/uL — ABNORMAL LOW (ref 3.90–5.10)
RDW: 13 % (ref 11–15)
WBC: 6.94 10*3/uL (ref 3.10–9.50)

## 2020-10-25 LAB — GLUCOSE WHOLE BLOOD - POCT
Whole Blood Glucose POCT: 278 mg/dL — ABNORMAL HIGH (ref 70–100)
Whole Blood Glucose POCT: 140 mg/dL — ABNORMAL HIGH (ref 70–100)
Whole Blood Glucose POCT: 181 mg/dL — ABNORMAL HIGH (ref 70–100)

## 2020-10-25 LAB — BASIC METABOLIC PANEL
Anion Gap: 13 (ref 5.0–15.0)
BUN: 94 mg/dL — ABNORMAL HIGH (ref 7.0–21.0)
CO2: 22 mEq/L (ref 17–29)
Calcium: 8.1 mg/dL — ABNORMAL LOW (ref 8.5–10.5)
Chloride: 100 mEq/L (ref 99–111)
Creatinine: 6.8 mg/dL — ABNORMAL HIGH (ref 0.4–1.0)
Glucose: 230 mg/dL — ABNORMAL HIGH (ref 70–100)
Potassium: 5.2 mEq/L (ref 3.5–5.3)
Sodium: 135 mEq/L (ref 135–145)

## 2020-10-25 LAB — ANA SCREEN, IFA, WITH REFLEX TO TITER AND PATTERN
ANA Screen: POSITIVE — AB
ANA Titer: 1:160 {titer} — AB

## 2020-10-25 LAB — GFR: EGFR: 7.4

## 2020-10-25 MED ORDER — SODIUM CHLORIDE 0.9 % IV BOLUS
100.0000 mL | INTRAVENOUS | Status: AC | PRN
Start: 2020-10-25 — End: 2020-10-25

## 2020-10-25 MED ORDER — SODIUM CHLORIDE 0.9 % IV BOLUS
250.0000 mL | INTRAVENOUS | Status: AC | PRN
Start: 2020-10-25 — End: 2020-10-25

## 2020-10-25 NOTE — Plan of Care (Signed)
NURSING SHIFT NOTE     Patient: [name]  Day: 5      SHIFT EVENTS     Shift Narrative/Significant Events (PRN med administration, fall, RRT, etc.):     No acute events this shift. Run af dialysis today.    Safety and fall precautions remain in place. Purposeful rounding completed.          ASSESSMENT     Changes in assessment from patient's baseline this shift:    Neuro: No  CV: No  Pulm: No  Peripheral Vascular: No  HEENT: No  GI: No  BM during shift: No, Last BM: Last BM Date: 10/24/20  GU: No   Integ: No  MS: No    Pain: None  Pain Interventions:   Medications Utilized:     Mobility: PMP Activity: Step 3 - Bed Mobility of Distance Walked (ft) (Step 6,7): 5 Feet           Lines     Patient Lines/Drains/Airways Status       Active Lines, Drains and Airways       Name Placement date Placement time Site Days    Peripheral IV 10/20/20 20 G Right Wrist 10/20/20  1211  Wrist  5    External Urinary Catheter 10/21/20  1000  --  4    Graft/Fistula Left --  --  -- --                         VITAL SIGNS     Vitals:    10/25/20 1430   BP: 108/59   Pulse: 63   Resp: 16   Temp:    SpO2:        Temp  Min: 97.5 F (36.4 C)  Max: 98 F (36.7 C)  Pulse  Min: 59  Max: 67  Resp  Min: 15  Max: 20  BP  Min: 97/52  Max: 130/61  SpO2  Min: 92 %  Max: 98 %      Intake/Output Summary (Last 24 hours) at 10/25/2020 1444  Last data filed at 10/25/2020 0728  Gross per 24 hour   Intake 300 ml   Output --   Net 300 ml              CARE PLAN        Problem: Moderate/High Fall Risk Score >5  Goal: Patient will remain free of falls  Outcome: Progressing  Flowsheets (Taken 10/25/2020 0800)  High (Greater than 13):   HIGH-Visual cue at entrance to patient's room   HIGH-Bed alarm on at all times while patient in bed     Problem: Safety  Goal: Patient will be free from injury during hospitalization  Outcome: Progressing  Flowsheets (Taken 10/25/2020 1441)  Patient will be free from injury during hospitalization:   Assess patient's risk for  falls and implement fall prevention plan of care per policy   Provide and maintain safe environment   Ensure appropriate safety devices are available at the bedside   Use appropriate transfer methods   Include patient/ family/ care giver in decisions related to safety   Hourly rounding     Problem: Pain  Goal: Pain at adequate level as identified by patient  Outcome: Progressing  Flowsheets (Taken 10/25/2020 1441)  Pain at adequate level as identified by patient:   Identify patient comfort function goal   Assess for risk of opioid induced respiratory depression, including snoring/sleep apnea.  Alert healthcare team of risk factors identified.   Assess pain on admission, during daily assessment and/or before any "as needed" intervention(s)   Reassess pain within 30-60 minutes of any procedure/intervention, per Pain Assessment, Intervention, Reassessment (AIR) Cycle   Evaluate if patient comfort function goal is met   Evaluate patient's satisfaction with pain management progress

## 2020-10-25 NOTE — Discharge Instr - AVS First Page (Addendum)
[facility] DISCHARGE INSTRUCTIONS     Date of Admission: 10/20/2020    Date of Discharge: 10/25/2020    Discharge Physician: Dillard Essex, MD    Dear [name],     Thank you for choosing [facility] for your emergency care needs. We strive to provide EXCELLENT care to you and your family.     In an effort to explain clearly why you were here in the hospital, I've written a very brief summary. I hope that you find it useful. Other details including formal diagnosis, medication changes, follow up appointment recommendations, and access to MyChart for formal medical records can be found in this packet.       You were admitted for Shortness of breath due to missed dialysis for which you will need to continue with dialysis 3 times a week as a scheduled Monday Wednesday Friday please make sure to attend dialysis as a scheduled.    Your MRI of the brain was normal no evidence of stroke, your CT scan of the head and CT scan of the cervical spine is negative for acute finding.    After your fall you had some pain in your left wrist likely it is a muscle strain CT scan of the left wrist ruled out a fracture.    Please come back to the hospital if you develop chest pain, difficulty breathing intractable nausea vomiting or any concerning symptoms.     Make sure to follow up with our [facility] or PCP None, MD your primary care doctor for follow-up. I cannot stress the importance of follow up enough.    Make sure to bring the following to your doctors appointments:  Medications in their original bottles  Glucometer/blood sugar log (if diabetic)   Weight log (if you have heart failure)    If you are unable to obtain an appointment, unable to obtain newly prescribed medications, or are unclear about any of your discharge instructions please contact me at [phone] (M-F, 8am-3pm) or weekends and after hours via the hospital operator [phone]) [phone], the hospital case manager, or  your primary care physician.    Finally, as your discharging physician, you may be receiving a survey which is regarding my care. I would greatly value and appreciate your feedback as I strive for excellence.     Respectfully yours,    Dillard Essex, MD  10/25/2020

## 2020-10-25 NOTE — Progress Notes (Signed)
Received patient, alert oriented x3 , no complain , bruit present on access, will monitor closely later during HD     10/25/20 1100   Bedside Nurse Communication   Name of bedside RN - pre dialysis Thereasa Parkin,   Treatment Initiation- With Dialysis Precautions   Time Out/Safety Check Completed Yes   Consent for HD signed for this hospitalization (Date) 10/20/20   Consent for HD signed for this hospitalization (Time) 1545   Preferred language No   Complication waiver N   Blood Consent Verified N/A   Dialysis Precautions All Connections Secured;Saline Line Double Clamped;Venous Parameters Set;Arterial Parameters Set;Air Foam Detecctor Engaged   Dialysis Treatment Type Routine   Special Considerations hypotensive pt   Is patient diabetic? Yes   RO/Hemodialysis Architectural technologist   Is Total Chlorine less than 0.1 ppm? Yes   Orignial Total Chlorine Testing Time 0900   RO/Hemodialysis Facilities manager Number 4    Machine Serial Number [id number]   RO # 27   RO Serial # [id number]   Water Hardness NA   pH 7.2   Pressure Test Verified Yes   Alarms Verified Passed   Machine Temperature 97.7 F (36.5 C)   Alarms Verified Yes   Na+ mEq (Machine) 138 mEq   Bicarb mEq (Machine) 37 mEq   Hemodialysis Conductivity (Machine) 13.5   Hemodialysis Conductivity (Meter) 13.6   Dialyzer Lot Number [id number]   Tubing Lot Number [id number]   RO Machine Log Completed Yes   Hepatitis Status   HBsAg (Antigen) Result Negative   HBsAg Date Drawn 10/20/20   HBsAg Repeat Draw Due Date 11/16/20   HBsAb (Antibody) Result Non-Reactive   Hepatitis C Antibody Non-Reactive   Dialysis Weight   Pre-Treatment Weight (Kg) 80.9   Scale Type ICU Bed Scale   Vitals   Heart Rate 61   Resp Rate 16   BP 97/52   SpO2 97 %   O2 Device None (Room air)   Assessment   Mental Status Alert;Oriented;Cooperative   Cardiac (WDL) WDL   Cardiac Regularity Regular   Cardiac Symptoms None   Cardiac Rhythm Sinus Loletha Grayer   Respiratory  WDL   Respiratory Pattern  Regular   Bilateral Breath Sounds Clear   Edema  X   Generalized Edema Non Pitting Edema   Facial Non Pitting Edema   Graft/Fistula Left   No placement date or time found.   Access Type: Arteriovenous Fistula  Orientation: Left  Graft/Fistula Location: Upper Arm   Fistula/ Graft Assessment Abnormalities WDL   Needle Size 15 Gauge   Pain Assessment   Charting Type Assessment   Pain Scale Used Numeric Scale (0-10)   Numeric Pain Scale   Pain Score 0   POSS Score 1   Hemodialysis Comments   Pre-Hemodialysis Comments hd started, no complain   Hemodialysis History Information   Outpatient Dialysis Unit [facility]   Chronic Dialysis Patient Yes   Outpatient Nephrologist [facility]

## 2020-10-25 NOTE — Progress Notes (Signed)
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 difficile colitis  Elevated ALK phos  Elevated troponin  Hypocalcemia- improving  Severe left wrist pain with severe bilateral hip pain- after a fall at home- No fracture on recent imaging, resolved  ANA positive  Hyperferrritinemia         Plan:  Platelets are above threshold of intervention- response to steroids confirms daignosis  Monitor H/H and Platelets  Iron replete. Retic 1.7. ferritin high at 3158.38- continue iron repletion with dialysis and ESA therapy  Continue home meds for comorbid conditions    Plan for discharge today  Outpatient followup      Cindi Carbon, MD    10/25/2020  1:38 PM       SUBJECTIVE:   Her left wrist pain is much improved  Denies cp or sob  No nausea, vomiting, abdominal pain    Heptoglobin 225, Retic count 1.7  ANA positive   Ferritin 3158.40     OBJECTIVE       Visit Vitals  BP 114/60   Pulse 63   Temp 98 F (36.7 C)   Resp 15   Ht 1.575 m ('5\' 2"'$ )   Wt 80.9 kg (178 lb 4.8 oz)   LMP  (LMP Unknown)   SpO2 97%   BMI 32.61 kg/m       Body mass index is 32.61 kg/m.    No intake or output data in the 24 hours ending 10/25/20 1338       REVIEW OF SYSTEMS     Per narrative, 2 system review was done.       PHYSICAL EXAM     General: Obese  Skin: no rashes or lesions.  Eye: PERRL, EOMI  HENT: normocephalic, moist oral mucosa, no scleral icterus  Neck: supple, no lymphadenopathy.  Lungs: non-labored respiration.  Heart:  normal rate, regular rhythm, no murmur.  Abdomen: soft, non-tender, no masses.  Musculoskeletal: Tenderness over the dorsum of her left distal forearm and entire left lateral upper leg. no swelling.  Lymphatic: B/L lower leg edema, no clubbing   Neurologic: awake, alert, and oriented X3, non-focal exam.     MEDICATIONS      Scheduled Meds: PRN Meds:    acetaminophen, 1,000 mg, Oral, Q8H  amLODIPine, 10 mg, Oral, Daily  aspirin, 81 mg, Oral, Daily  atorvastatin, 40 mg, Oral, QHS  carvedilol, 25 mg, Oral, Q12H SCH  gabapentin, 600 mg, Oral, Daily  insulin glargine, 25 Units, Subcutaneous, Q12H  insulin lispro, 1-6 Units, Subcutaneous, QHS  insulin lispro, 2-10 Units, Subcutaneous, TID AC  insulin lispro, 5 Units, Subcutaneous, TID AC  lidocaine, 1 patch, Transdermal, Q24H  sevelamer, 800 mg, Oral, TID MEALS  vitamin C, 500 mg, Oral, Daily       glucagon (rDNA), 1 mg, PRN   And  dextrose, 250 mL, PRN   And  dextrose, 25 g, PRN   And  dextrose, 25 g, PRN  hydrALAZINE, 10 mg, Q6H PRN  melatonin, 3 mg, QHS PRN  naloxone, 0.2 mg, PRN  oxyCODONE, 5 mg, Q4H PRN  sodium chloride, 100 mL, Q1H PRN  sodium chloride, 250 mL, PRN         Home Meds:      Home Medications       Med List Status: Complete Set By: Blase Mess, RN at 10/20/2020  9:48 PM      Status Comment        10/20/2020  9:49 PM     Pt reports taking other medication, but unsure of which              amLODIPine (NORVASC) 10 MG tablet     Take 1 tablet (10 mg total) by mouth daily     aspirin 81 MG chewable tablet     Chew 81 mg by mouth daily     carvedilol (COREG) 25 MG tablet     Take 1 tablet by mouth daily     gabapentin (NEURONTIN) 600 MG tablet     Take 1 tablet by mouth daily     insulin aspart (NovoLOG) 100 UNIT/ML injection     Inject 15 Units into the skin 3 (three) times daily before meals     sevelamer (RENVELA) 800 MG tablet     Take 800 mg by mouth 3 (three) times daily with meals     Tresiba FlexTouch 200 UNIT/ML Solution Pen-injector     Inject  35 Units into the skin nightly     vitamin C (ASCORBIC ACID) 500 MG tablet     Take 1 tablet (500 mg total) by mouth daily                                                                   Flagged for Removal               atorvastatin (LIPITOR) 40 MG tablet     Take 40 mg by mouth daily                 I personally reviewed all of the medications       LABS     Recent Labs   Lab 10/25/20  0301 10/24/20  1028 10/23/20  1101 10/22/20  0652 10/21/20  0353 10/20/20  1157   Glucose 230* 482* 494*  More results in Results Review 515* 476*   BUN 94.0* 69.0* 36.0*  More results in Results Review 37.0* 64.0*   Creatinine 6.8* 5.7* 4.0*  More results in Results Review 5.2* 8.0*   EGFR 7.4 9.0 13.6  More results in Results Review 10.0 6.1   Calcium 8.1* 8.3* 8.4*  More results in Results Review 7.8* 7.9*   Sodium 135 136 137  More results in Results Review 138 135   Potassium 5.2 4.8 4.7  More results in Results Review 4.2 4.7   Chloride 100 99 98*  More results in Results Review 102 100   CO2 '22 24 27  '$ More results in Results Review 26 22   Albumin  --   --   --   --  2.4* 2.7*   Phosphorus  --   --   --   --   --  5.4*   Magnesium  --   --   --   --   --  2.0   AST (SGOT)  --   --   --   --  12 22   ALT  --   --   --   --  32 41   Bilirubin, Total  --   --   --   --  0.6 0.5   Alkaline Phosphatase  --   --   --   --  151* 186*   More results in Results Review = values in this interval not displayed.       Recent Labs   Lab 10/25/20  0301 10/24/20  1028 10/23/20  1101   WBC 6.94 6.89 5.20   Hgb 10.0* 10.0* 10.6*   Hematocrit 32.8* 32.9* 34.2*   MCV 99.7* 97.1* 97.4*   MCH 30.4 29.5 30.2   MCHC 30.5* 30.4* 31.0*   Platelets 132* 111* 93*       Band Neutrophils Absolute   Date Value Ref Range Status   01/05/2020 0.00 0.00 - 1.00 x10 3/uL Final       Iron   Date Value Ref Range Status   10/22/2020 41 40 - 145 ug/dL Final   01/05/2020 50 40 - 145 ug/dL Final     TIBC   Date Value Ref Range Status   10/22/2020 166 (L) 265 - 497  ug/dL Final   01/05/2020 188 (L) 265 - 497 ug/dL Final     Ferritin   Date Value Ref Range Status   12/31/2019 3,158.40 (H) 4.60 - 204.00 ng/mL Final   05/13/2019 1,070.90 (H) 4.60 - 204.00 ng/mL Final     Folate   Date Value Ref Range Status   10/22/2020 7.2 See below ng/mL Final     Comment:     Deficient    : <3.5 ng/mL  Intermediate : 3.5 - 5.4 ng/mL  Normal       : Greater Than 5.4 ng/mL     01/05/2020 8.3 See below ng/mL Final     Comment:     Deficient    : <3.5 ng/mL  Intermediate : 3.5 - 5.4 ng/mL  Normal       : Greater Than 5.4 ng/mL       Vitamin B-12   Date Value Ref Range Status   10/22/2020 674 211 - 911 pg/mL Final   01/05/2020 850 211 - 911 pg/mL Final     Haptoglobin   Date Value Ref Range Status   10/23/2020 225 35 - 250 mg/dL Final     LDH   Date Value Ref Range Status   10/22/2020 114 (L) 120 - 331 U/L Final     Reticulocyte Count Automated   Date Value Ref Range Status   10/23/2020 1.7 0.8 - 2.3 % Final     Reticulocyte Count Absolute   Date Value Ref Range Status   10/23/2020 0.0593 0.0220 - 0.1420 x10 6/uL Final       No results for input(s): PTT, PT, INR in the last 72 hours.        Microbiology:  Microbiology Results (last 15 days)       Procedure Component Value Units Date/Time    COVID-19 (SARS-CoV-2) only ([facility]) asymptomatic admission - Hospitals [id number] Collected: 10/20/20 1318    Order Status: Completed Specimen: Nasopharyngeal Updated: 10/20/20 1351     Purpose of COVID testing Screening     SARS-CoV-2 Specimen  Source Nasal Swab     SARS CoV 2 Overall Result Not Detected     Comment: __________________________________________________  -A result of "Detected" indicates POSITIVE for the    presence of SARS CoV-2 RNA  -A result of "Not Detected" indicates NEGATIVE for the    presence of SARS CoV-2 RNA  __________________________________________________________  Test performed using the Roche cobas Liat SARS-CoV-2 assay. This assay is  only for use under the Food and Drug  Administration's Emergency Use  Authorization. This is a real-time RT-PCR assay for the qualitative  detection of SARS-CoV-2 RNA. Viral nucleic acids may persist in vivo,  independent of viability. Detection of viral nucleic acid does not imply the  presence of infectious virus, or that virus nucleic acid is the cause of  clinical symptoms. Negative results do not preclude SARS-CoV-2 infection and  should not be used as the sole basis for diagnosis, treatment or other  patient management decisions. Negative results must be combined with  clinical observations, patient history, and/or epidemiological information.  Invalid results may be due to inhibiting substances in the specimen and  recollection should occur. Please see Fact Sheets for patients and providers  located:  https://www.benson-chung.com/         Narrative:      o Collect and clearly label specimen type:  o PREFERRED-Upper respiratory specimen: One Nasal Swab in  Transport Media.  o Hand deliver to laboratory ASAP  Indication for testing->Extended care facility admission to  semi private room  Screening             IMAGING     Diagnostic Imaging:  XR Hip left 1 vw with pelvis    Result Date: 10/20/2020  No radiographically apparent displaced fracture is present. Please note that decreased bony mineralization can limit evaluation for nondisplaced fractures. Vernie Shanks, MD  10/20/2020 1:27 PM    XR Chest PA Or AP    Result Date: 10/20/2020  No significant interval change of diffuse interstitial markings, suggestive of pulmonary vascular congestion. Elnita Maxwell, MD  10/20/2020 1:19 PM    Forearm Complete Left    Result Date: 10/20/2020  1. No acute fracture. 2. Diffuse soft tissue swelling, nonspecific. 3. Diffuse osteopenia. 4. Peripheral vascular disease. Lonia Skinner, MD  10/20/2020 1:11 PM    Wrist Left PA Lateral and Oblique    Result Date: 10/20/2020  1. No acute fracture. 2. Diffuse soft tissue swelling, nonspecific. 3. Diffuse osteopenia.  4. Peripheral vascular disease. Lonia Skinner, MD  10/20/2020 1:11 PM    CT Head WO Contrast    Result Date: 10/21/2020   No currently detectable acute intracranial hemorrhage or mass effect or edema. Mildly suboptimal study for detection of small amounts of hemorrhage due to multiple regions of beam hardening artifact in the periphery of the frontal lobes.. Note: Note that CT scanning at this site  utilizes multiple dose reduction techniques including automatic exposure control, adjustment of the MAA and/or KVP according to patient's size and use of iterative reconstruction technique Elyn Peers, MD  10/21/2020 8:16 AM    CT Cervical Spine WO Contrast    Result Date: 10/23/2020   1. No evidence of fracture. 2. Endplate and facet joint degenerative changes throughout cervical spine as described above. Raphael Gibney, DO  10/23/2020 7:39 AM    US Abdomen Complete    Result Date: 10/22/2020   1. Hepatomegaly. No evidence of splenomegaly. Earney Navy, MD  10/22/2020 10:02 AM    CT Boney Pelvis  Result Date: 10/22/2020   No CT evidence for fracture or dislocation of the pelvis or either hip. Edison Simon, MD  10/22/2020 7:44 AM    CT Forearm Left WO Contrast    Result Date: 10/22/2020   No gross CT evidence for fracture or dislocation of the left wrist, radius, or ulna within study limitations as described above. However, nondisplaced fractures cannot be excluded. If there is persistent clinical concern for wrist or elbow fracture, further evaluation with MRI is recommended Edison Simon, MD  10/22/2020 7:38 AM    CT Wrist Left WO Contrast    Result Date: 10/22/2020   No gross CT evidence for fracture or dislocation of the left wrist, radius, or ulna within study limitations as described above. However, nondisplaced fractures cannot be excluded. If there is persistent clinical concern for wrist or elbow fracture, further evaluation with MRI is recommended Edison Simon, MD  10/22/2020 7:38 AM    US Venous Low Extrem Duplx Dopp Comp  Bilat    Result Date: 10/22/2020   NORMAL VENOUS DUPLEX OF THE LOWER EXTREMITIES. NO EVIDENCE OF INTRALUMINAL THROMBUS OR OBSTRUCTION TO VENOUS FLOW IN RIGHT OR LEFT LOWER EXTREMITY. Nelma Rothman, MD  10/22/2020 12:35 PM

## 2020-10-25 NOTE — Progress Notes (Signed)
Pt completed Hd , removed 3L UF, uneventful HD, report given to Franklin.     10/25/20 1445   Treatment Summary   Time Off Machine 1437   Duration of Treatment (Hours) 3:30   Treatment Type 2:1   Dialyzer Clearance Lightly streaked   Fluid Volume Off (mL) 3500   Prime Volume (mL) 250   Rinseback Volume (mL) 250   Fluid Given: Normal Saline (mL) 0   Fluid Given: PRBC  0 mL   Fluid Given: Albumin (mL) 0   Fluid Given: Other (mL) 0   Total Fluid Given 500   Hemodialysis Net Fluid Removed 3000   Post Treatment Assessment   Post-Treatment Weight (Kg) 77.9   Patient Response to Treatment tolerated HD tx well   Additional Dialyzer Used none   Graft/Fistula Left   No placement date or time found.   Access Type: Arteriovenous Fistula  Orientation: Left  Graft/Fistula Location: Upper Arm   Fistula/ Graft Assessment Abnormalities WDL   Needle Size 15 Gauge   Cannulation Sites held Arterial (min) 10   Cannulation Sites held Venous (min) 10   Hemostasis Achieved Yes   Vitals   Temp 98 F (36.7 C)   Heart Rate 62   Resp Rate 15   BP 126/59   Assessment   Mental Status Alert;Oriented;Cooperative   Cardiac (WDL) WDL   Cardiac Regularity Regular   Cardiac Symptoms None   Cardiac Rhythm Normal Sinus Rhythm   Respiratory  WDL   Respiratory Pattern Regular   Bilateral Breath Sounds Clear   R Breath Sounds Clear   L Breath Sounds Clear   Edema  WDL   Generalized Edema Non Pitting Edema   Facial None   Sacral UTA   RLE Edema Non Pitting Edema   LLE Edema Non Pitting Edema   General Skin Color Appropriate for ethnicity   Skin Condition/Temp Warm;Dry   Gastrointestinal (WDL) WDL   Abdomen Inspection Nondistended   GI Symptoms None   Mobility Ambulatory   Pain Assessment   Charting Type Assessment   Pain Scale Used Numeric Scale (0-10)   Numeric Pain Scale   Pain Score 0   POSS Score 1   Education   Person taught Patient   Knowledge basis Substantial   Topics taught Fluid Management;Procedure   Teaching Tools Explain   [name]  Understanding   Bedside Nurse Communication   Name of bedside RN - post dialysis karina,verjovkina

## 2020-10-25 NOTE — Plan of Care (Signed)
Discharge order verified, pt ID verified    Pt cleared for discharge after HD today. RN provided discharge instructions along with information regarding HD compliance, medication regimen, and f/u appointments to the pt and son at the bedside. Parties verbalized understanding of the preceding information. All questions and concerns were addressed. No new prescriptions. PIV removed. Telemetry removed and returned. Pt transported off unit with all belongings via wheelchair.     NAD noted, no complaints voiced.    Pt refusing SNF. Will resume 24/7 care at home.     VSSBP 110/55    Pulse 64    Temp 97.3 F (36.3 C) (Oral)    Resp 18    Ht 1.575 m ('5\' 2"'$ )    Wt 80.9 kg (178 lb 4.8 oz)    LMP  (LMP Unknown) Comment: post menopausal   SpO2 98%    BMI 32.61 kg/m

## 2020-10-25 NOTE — Discharge Summary (Addendum)
[facility]      Patient: [name]  Admission Date: 10/20/2020   DOB: May 05, 1955  Discharge Date: 10/25/2020    MRN: [id number]  Discharge Attending:Haniah Penny Jesse Sans, MD     Referring Physician: Pcp, None, MD  PCP: Pcp, None, MD       DISCHARGE SUMMARY     Discharge Information   Admission Diagnosis:   Shortness of breath and pulmonary edema due to missed dialysis.  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  Acute respiratory failure ruled out    Discharge Diagnosis:   Shortness of breath and pulmonary edema due to missed dialysis.  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  Acute respiratory failure ruled out    Admission Condition: Poor  Discharge Condition: Stable  Consultants: Neurology, cardiology, nephrology  Functional Status: Below baseline  Discharged to: Recommendation for SNF however she would like to go home with home health, and supervision at home she understand the risk of falls and fractures and stated that she have 24-hour supervision at home.  She is alert oriented x4 and have the capacity to make her own decisions.    Discharge Medications:     Medication List        CONTINUE taking these medications      amLODIPine 10 MG tablet  Commonly known as: NORVASC  Take 1 tablet (10 mg total) by mouth daily     aspirin 81 MG chewable tablet     atorvastatin 40 MG tablet  Commonly known as: LIPITOR     carvedilol 25 MG tablet  Commonly known as: COREG     gabapentin 600 MG tablet  Commonly known as:  NEURONTIN     insulin aspart 100 UNIT/ML injection  Commonly known as: NovoLOG     sevelamer 800 MG tablet  Commonly known as: RENVELA     Tresiba FlexTouch 200 UNIT/ML Sopn  Generic drug: Insulin Degludec     vitamin C 500 MG tablet  Commonly known as: ASCORBIC ACID  Take 1 tablet (500 mg total) by mouth daily                  Hospital Course   Presentation History / Hospital Course (5 Days)     65 year old female with history of hypertension, hyperlipidemia, diabetes obstructive sleep apnea noncompliant with CPAP, end-stage renal disease on hemodialysis was approached to the hospital for left hip pain and left wrist pain after a fall she did not lose consciousness and missing dialysis on Monday  Admitted to IMCU shortness of breath had x-ray shows interstitial markings suggestive of pulmonary vascular congestion due to missing dialysis she is status post dialysis which improvement in her symptoms she is currently on room air.  Elevated troponin likely due to demand ischemia in the setting of end-stage renal disease discussed with patient need to comply with dialysis, cardiology consulted recommended follow-up as an outpatient no need for further inpatient work-up.  For the severe left wrist pain patient had x-ray which  was negative for fracture CT scan of the left wrist was negative for fracture as well offered the patient MRI however her pain started to improve with 2 doses of steroids so canceled due to significant improvement in symptoms.  Bilateral hip pain negative for fracture.  Due to severe pain in the left wrist patient received 2 doses of steroid prednisone 20 mg that led to hyperglycemia and uncontrolled diabetes adjustment in the dosing of her insulin.  Patient was evaluated by PT OT who recommended SNF however the patient declined SNF she is alert oriented x4 able to make informed medical decision she would like to be discharged home with home health she have 24-hour supervision by an nurse and she  will be able to follow-up as an outpatient.  On the day of discharge patient is alert oriented x4 in no acute distress able to comprehend the discharge instruction and return to hospital instruction vitally stable receiving dialysis after dialysis she will be transferred home.  Called and updated her daughter that the patient will be discharged home.       Best Practices   Was the patient admitted with either a CHF Exacerbation or Pneumonia? no     Progress Note/Physical Exam at Discharge     Subjective: Feels better denies chest  pain nausea vomiting abdominal pain    Vitals:    10/25/20 1115 10/25/20 1130 10/25/20 1145 10/25/20 1200   BP: 105/57 120/58 101/54 101/51   Pulse: 60 60 61 63   Resp: '19 20 16 18   '$ Temp:       TempSrc:       SpO2:       Weight:       Height:             General: NAD, AAOx3  HEENT: perrla, eomi, sclera anicteric, OP: Clear, MMM  Neck: supple, FROM, no LAD  Cardiovascular: RRR, no m/r/g  Lungs: CTAB, no w/r/r  Abdomen: soft, +BS, NT/ND, no masses, no g/r  Extremities: no C/C/E  Skin: no rashes or lesions noted  Neuro: CN 2-12 intact; No Focal neurological deficits       Diagnostics     Labs/Studies Pending at Discharge: No    Last Labs   Recent Labs   Lab 10/25/20  0301 10/24/20  1028 10/23/20  1101   WBC 6.94 6.89 5.20   RBC 3.29* 3.39* 3.51*   Hgb 10.0* 10.0* 10.6*   Hematocrit 32.8* 32.9* 34.2*   MCV 99.7* 97.1* 97.4*   Platelets 132* 111* 93*       Recent Labs   Lab 10/25/20  0301 10/24/20  1028 10/23/20  1101 10/22/20  0652 10/21/20  0353 10/20/20  1157   Sodium 135 136 137 139 138 135   Potassium 5.2 4.8 4.7 4.0 4.2 4.7   Chloride 100 99 98* 103 102 100   CO2 '22 24 27 26 26 22   '$ BUN 94.0* 69.0* 36.0* 31.0* 37.0* 64.0*   Creatinine 6.8* 5.7* 4.0* 4.3* 5.2* 8.0*   Glucose 230* 482* 494* 218* 515* 476*   Calcium 8.1* 8.3* 8.4* 8.3* 7.8* 7.9*   Magnesium  --   --   --   --   --  2.0       Microbiology Results (last 15 days)       Procedure Component Value Units Date/Time    COVID-19  (SARS-CoV-2) only ([facility]) asymptomatic admission - Hospitals [id number] Collected: 10/20/20 1318    Order  Status: Completed Specimen: Nasopharyngeal Updated: 10/20/20 1351     Purpose of COVID testing Screening     SARS-CoV-2 Specimen Source Nasal Swab     SARS CoV 2 Overall Result Not Detected     Comment: __________________________________________________  -A result of "Detected" indicates POSITIVE for the    presence of SARS CoV-2 RNA  -A result of "Not Detected" indicates NEGATIVE for the    presence of SARS CoV-2 RNA  __________________________________________________________  Test performed using the Roche cobas Liat SARS-CoV-2 assay. This assay is  only for use under the Food and Drug Administration's Emergency Use  Authorization. This is a real-time RT-PCR assay for the qualitative  detection of SARS-CoV-2 RNA. Viral nucleic acids may persist in vivo,  independent of viability. Detection of viral nucleic acid does not imply the  presence of infectious virus, or that virus nucleic acid is the cause of  clinical symptoms. Negative results do not preclude SARS-CoV-2 infection and  should not be used as the sole basis for diagnosis, treatment or other  patient management decisions. Negative results must be combined with  clinical observations, patient history, and/or epidemiological information.  Invalid results may be due to inhibiting substances in the specimen and  recollection should occur. Please see Fact Sheets for patients and providers  located:  https://www.benson-chung.com/         Narrative:      o Collect and clearly label specimen type:  o PREFERRED-Upper respiratory specimen: One Nasal Swab in  Transport Media.  o Hand deliver to laboratory ASAP  Indication for testing->Extended care facility admission to  semi private room  Screening             Patient Instructions   Discharge Diet: Renal carb consistent diet  Discharge Activity: As tolerated    Follow Up Appointment:    Follow-up Information       Alway, Leonie Green, MD Follow up in 1 month(s).    Specialty: Neurology  Contact information:  [address]  [place] [address]  [phone]               your primary care doctor. Schedule an appointment as soon as possible for a visit in 1 week(s).               Pcp, None, MD .               Orpah Clinton, DO. Schedule an appointment as soon as possible for a visit in 4 week(s).    Specialties: Cardiology, Internal Medicine  Why: for after discharge follow up  Contact information:  [address] [facility] [address]  [place] [address]  [phone]                              Time spent examining patient, discussing with patient/family regarding hospital course, chart review, reconciling medications and discharge planning: 46 minutes.    Signed,  Dillard Essex, MD    12:12 PM 10/25/2020

## 2020-10-25 NOTE — Plan of Care (Signed)
Patient is A/Ox 4, on room air, NSR on tele, has one patent IV line. Fall prevention and safety measures were reinforced. Pain was addressed with pain medicine. CHG bath was given.   Plan for pain management, HD today, and possible discharge.      Problem: Moderate/High Fall Risk Score >5  Goal: Patient will remain free of falls  Outcome: Progressing  Flowsheets (Taken 10/24/2020 2000)  High (Greater than 13):   HIGH-Consider use of low bed   HIGH-Initiate use of floor mats as appropriate   HIGH-Apply yellow "Fall Risk" arm band   HIGH-Utilize chair pad alarm for patient while in the chair   HIGH-Bed alarm on at all times while patient in bed   HIGH-Visual cue at entrance to patient's room   MOD-Place Fall Risk level on whiteboard in room   MOD-Include family in multidisciplinary POC discussions   MOD-Request PT/OT consult order for patients with gait/mobility impairment   MOD-Perform dangle, stand, walk (DSW) prior to mobilization   MOD-Re-orient confused patients     Problem: Renal Instability  Goal: Fluid and electrolyte balance are achieved/maintained  Outcome: Progressing  Flowsheets (Taken 10/25/2020 0434)  Fluid and electrolyte balance are achieved/maintained:   Monitor intake and output every shift   Monitor/assess lab values and report abnormal values   Assess and reassess fluid and electrolyte status   Assess for confusion/personality changes   Monitor daily weight   Observe for seizure activity and initiate seizure precautions if indicated   Follow fluid restrictions/IV/PO parameters   Monitor for muscle weakness   Observe for cardiac arrhythmias     Problem: Safety  Goal: Patient will be free from injury during hospitalization  Outcome: Progressing  Flowsheets (Taken 10/25/2020 0434)  Patient will be free from injury during hospitalization:   Assess patient's risk for falls and implement fall prevention plan of care per policy   Ensure appropriate safety devices are available at the bedside   Provide  and maintain safe environment   Use appropriate transfer methods   Include patient/ family/ care giver in decisions related to safety   Hourly rounding  Goal: Patient will be free from infection during hospitalization  Outcome: Progressing  Flowsheets (Taken 10/25/2020 0434)  Free from Infection during hospitalization:   Assess and monitor for signs and symptoms of infection   Monitor all insertion sites (i.e. indwelling lines, tubes, urinary catheters, and drains)   Encourage patient and family to use good hand hygiene technique   Monitor lab/diagnostic results     Problem: Pain  Goal: Pain at adequate level as identified by patient  Outcome: Progressing  Flowsheets (Taken 10/25/2020 0434)  Pain at adequate level as identified by patient:   Assess for risk of opioid induced respiratory depression, including snoring/sleep apnea. Alert healthcare team of risk factors identified.   Identify patient comfort function goal   Assess pain on admission, during daily assessment and/or before any "as needed" intervention(s)   Reassess pain within 30-60 minutes of any procedure/intervention, per Pain Assessment, Intervention, Reassessment (AIR) Cycle   Evaluate patient's satisfaction with pain management progress   Evaluate if patient comfort function goal is met   Offer non-pharmacological pain management interventions   Include patient/patient care companion in decisions related to pain management as needed

## 2020-10-25 NOTE — Plan of Care (Signed)
Problem: Renal Instability  Goal: Fluid and electrolyte balance are achieved/maintained  Outcome: Progressing  Flowsheets (Taken 10/25/2020 1138)  Fluid and electrolyte balance are achieved/maintained:   Assess and reassess fluid and electrolyte status   Monitor/assess lab values and report abnormal values     Problem: Patient Receiving Advanced Renal Therapies  Goal: Therapy access site remains intact  Outcome: Progressing  Flowsheets (Taken 10/25/2020 1138)  Therapy access site remains intact:   Change therapy access site dressing as needed   Assess therapy access site

## 2020-10-25 NOTE — Progress Notes (Signed)
Progress Note    Date Time: 10/25/20 10:06 AM  Patient Name: [name]  Attending Physician: Dillard Essex, MD      Assessment & Plan:   65 yo female with ESRD, OSA, COPD, DM, HLD, HTN s/p fall from bed with subsequent injuries including L wrist pain, edema, weakness, numbness.    -  Pt feels symptoms are gradually improving.  Still some posterior L hand numbness. Edema improving. Pain improving.  -  Supportive care.  -  If all edema subsides (likely in 3-4 weeks) but numbness persists - can obtain EMG in our office.    Subjective:   As above    Review of Systems:   No headache, eye, ear nose, throat problems; no coughing or wheezing or shortness of breath, No chest pain or orthopnea, no abdominal pain, nausea or vomiting, No pain in the body or extremities, no psychiatric, neurological, endocrine, hematological or cardiac complaints except as noted above.     Physical Exam:   Blood pressure 118/61, pulse (!) 59, temperature 97.7 F (36.5 C), temperature source Oral, resp. rate 18, height 1.575 m ('5\' 2"'$ ), weight 80.9 kg (178 lb 4.8 oz), SpO2 96 %.    L arm/wrist edema  Still tender to touch (but improved)      Meds:      Scheduled Meds: PRN Meds:    acetaminophen, 1,000 mg, Oral, Q8H  amLODIPine, 10 mg, Oral, Daily  aspirin, 81 mg, Oral, Daily  atorvastatin, 40 mg, Oral, QHS  carvedilol, 25 mg, Oral, Q12H [facility]  gabapentin, 600 mg, Oral, Daily  insulin glargine, 25 Units, Subcutaneous, Q12H  insulin lispro, 1-6 Units, Subcutaneous, QHS  insulin lispro, 2-10 Units, Subcutaneous, TID AC  insulin lispro, 5 Units, Subcutaneous, TID AC  lidocaine, 1 patch, Transdermal, Q24H  sevelamer, 800 mg, Oral, TID MEALS  vitamin C, 500 mg, Oral, Daily        Continuous Infusions:   glucagon (rDNA), 1 mg, PRN   And  dextrose, 250 mL, PRN   And  dextrose, 25 g, PRN   And  dextrose, 25 g, PRN  hydrALAZINE, 10 mg, Q6H PRN  melatonin, 3 mg, QHS PRN  naloxone, 0.2 mg, PRN  oxyCODONE, 5 mg, Q4H PRN            I personally  reviewed all of the medications    Labs:     Recent Labs   Lab 10/25/20  0301 10/24/20  1028 10/23/20  1101 10/22/20  0652 10/21/20  0353 10/20/20  1157   Glucose 230* 482* 494*  More results in Results Review 515* 476*   BUN 94.0* 69.0* 36.0*  More results in Results Review 37.0* 64.0*   Creatinine 6.8* 5.7* 4.0*  More results in Results Review 5.2* 8.0*   Calcium 8.1* 8.3* 8.4*  More results in Results Review 7.8* 7.9*   Sodium 135 136 137  More results in Results Review 138 135   Potassium 5.2 4.8 4.7  More results in Results Review 4.2 4.7   Chloride 100 99 98*  More results in Results Review 102 100   CO2 '22 24 27  '$ More results in Results Review 26 22   Albumin  --   --   --   --  2.4* 2.7*   Phosphorus  --   --   --   --   --  5.4*   Magnesium  --   --   --   --   --  2.0   AST (SGOT)  --   --   --   --  12 22   ALT  --   --   --   --  32 41   Bilirubin, Total  --   --   --   --  0.6 0.5   Alkaline Phosphatase  --   --   --   --  151* 186*   More results in Results Review = values in this interval not displayed.     Recent Labs   Lab 10/25/20  0301 10/24/20  1028 10/23/20  1101   WBC 6.94 6.89 5.20   Hgb 10.0* 10.0* 10.6*   Hematocrit 32.8* 32.9* 34.2*   MCV 99.7* 97.1* 97.4*   MCH 30.4 29.5 30.2   MCHC 30.5* 30.4* 31.0*   Platelets 132* 111* 93*         No results for input(s): PTT, PT, INR in the last 72 hours.       Radiology Results (24 Hour)       ** No results found for the last 24 hours. **             All recent brain and spine imaging (MRI, CT) results reviewed.    Code status listed in chart confirmed    The notes from the last 24 hours were reviewed.     Case discussed with: patient     35 minutes;  involving time spent examining patient, in counseling or coordination of care, reviewing test results, and in documentation.    Signed by: Merceda Elks, MD  [place]: [phone]       Answering Service: [phone]

## 2020-10-26 LAB — PATHOLOGY REVIEW-ANA

## 2020-11-12 ENCOUNTER — Encounter: Payer: Self-pay | Admitting: Internal Medicine

## 2020-11-12 ENCOUNTER — Observation Stay
Admission: EM | Admit: 2020-11-12 | Discharge: 2020-11-13 | Disposition: A | Discharge status: Home / Self Care | Payer: 59 | Attending: Internal Medicine | Admitting: Internal Medicine

## 2020-11-12 ENCOUNTER — Emergency Department: Payer: 59

## 2020-11-12 DIAGNOSIS — E1122 Type 2 diabetes mellitus with diabetic chronic kidney disease: Secondary | ICD-10-CM | POA: Insufficient documentation

## 2020-11-12 DIAGNOSIS — I160 Hypertensive urgency: Secondary | ICD-10-CM | POA: Insufficient documentation

## 2020-11-12 DIAGNOSIS — E785 Hyperlipidemia, unspecified: Secondary | ICD-10-CM | POA: Insufficient documentation

## 2020-11-12 DIAGNOSIS — E875 Hyperkalemia: Secondary | ICD-10-CM | POA: Diagnosis present

## 2020-11-12 DIAGNOSIS — R778 Other specified abnormalities of plasma proteins: Secondary | ICD-10-CM

## 2020-11-12 DIAGNOSIS — N179 Acute kidney failure, unspecified: Secondary | ICD-10-CM

## 2020-11-12 DIAGNOSIS — E8770 Fluid overload, unspecified: Secondary | ICD-10-CM | POA: Insufficient documentation

## 2020-11-12 DIAGNOSIS — R079 Chest pain, unspecified: Secondary | ICD-10-CM | POA: Insufficient documentation

## 2020-11-12 DIAGNOSIS — R112 Nausea with vomiting, unspecified: Secondary | ICD-10-CM | POA: Insufficient documentation

## 2020-11-12 DIAGNOSIS — R601 Generalized edema: Secondary | ICD-10-CM

## 2020-11-12 DIAGNOSIS — R197 Diarrhea, unspecified: Secondary | ICD-10-CM | POA: Insufficient documentation

## 2020-11-12 DIAGNOSIS — I120 Hypertensive chronic kidney disease with stage 5 chronic kidney disease or end stage renal disease: Secondary | ICD-10-CM | POA: Insufficient documentation

## 2020-11-12 DIAGNOSIS — M791 Myalgia, unspecified site: Secondary | ICD-10-CM | POA: Insufficient documentation

## 2020-11-12 DIAGNOSIS — R1110 Vomiting, unspecified: Secondary | ICD-10-CM

## 2020-11-12 DIAGNOSIS — Z992 Dependence on renal dialysis: Secondary | ICD-10-CM | POA: Insufficient documentation

## 2020-11-12 DIAGNOSIS — Z8249 Family history of ischemic heart disease and other diseases of the circulatory system: Secondary | ICD-10-CM | POA: Insufficient documentation

## 2020-11-12 DIAGNOSIS — E872 Acidosis, unspecified: Secondary | ICD-10-CM | POA: Insufficient documentation

## 2020-11-12 DIAGNOSIS — Z9115 Patient's noncompliance with renal dialysis: Secondary | ICD-10-CM | POA: Insufficient documentation

## 2020-11-12 DIAGNOSIS — R059 Cough, unspecified: Secondary | ICD-10-CM | POA: Insufficient documentation

## 2020-11-12 DIAGNOSIS — J449 Chronic obstructive pulmonary disease, unspecified: Secondary | ICD-10-CM | POA: Insufficient documentation

## 2020-11-12 DIAGNOSIS — N186 End stage renal disease: Secondary | ICD-10-CM | POA: Insufficient documentation

## 2020-11-12 DIAGNOSIS — D631 Anemia in chronic kidney disease: Secondary | ICD-10-CM | POA: Insufficient documentation

## 2020-11-12 DIAGNOSIS — I161 Hypertensive emergency: Principal | ICD-10-CM | POA: Insufficient documentation

## 2020-11-12 DIAGNOSIS — E1151 Type 2 diabetes mellitus with diabetic peripheral angiopathy without gangrene: Secondary | ICD-10-CM | POA: Insufficient documentation

## 2020-11-12 DIAGNOSIS — N184 Chronic kidney disease, stage 4 (severe): Secondary | ICD-10-CM

## 2020-11-12 DIAGNOSIS — G4733 Obstructive sleep apnea (adult) (pediatric): Secondary | ICD-10-CM | POA: Insufficient documentation

## 2020-11-12 DIAGNOSIS — R509 Fever, unspecified: Secondary | ICD-10-CM | POA: Insufficient documentation

## 2020-11-12 DIAGNOSIS — Z20822 Contact with and (suspected) exposure to covid-19: Secondary | ICD-10-CM | POA: Insufficient documentation

## 2020-11-12 DIAGNOSIS — Z833 Family history of diabetes mellitus: Secondary | ICD-10-CM | POA: Insufficient documentation

## 2020-11-12 DIAGNOSIS — I739 Peripheral vascular disease, unspecified: Secondary | ICD-10-CM | POA: Insufficient documentation

## 2020-11-12 LAB — CBC AND DIFFERENTIAL
Absolute NRBC: 0 10*3/uL (ref 0.00–0.00)
Basophils Absolute Automated: 0.03 10*3/uL (ref 0.00–0.08)
Basophils Automated: 0.6 %
Eosinophils Absolute Automated: 0.08 10*3/uL (ref 0.00–0.44)
Eosinophils Automated: 1.6 %
Hematocrit: 34.2 % — ABNORMAL LOW (ref 34.7–43.7)
Hgb: 10.5 g/dL — ABNORMAL LOW (ref 11.4–14.8)
Immature Granulocytes Absolute: 0.03 10*3/uL (ref 0.00–0.07)
Immature Granulocytes: 0.6 %
Lymphocytes Absolute Automated: 0.81 10*3/uL (ref 0.42–3.22)
Lymphocytes Automated: 16.2 %
MCH: 29.9 pg (ref 25.1–33.5)
MCHC: 30.7 g/dL — ABNORMAL LOW (ref 31.5–35.8)
MCV: 97.4 fL — ABNORMAL HIGH (ref 78.0–96.0)
MPV: 12.8 fL — ABNORMAL HIGH (ref 8.9–12.5)
Monocytes Absolute Automated: 0.47 10*3/uL (ref 0.21–0.85)
Monocytes: 9.4 %
Neutrophils Absolute: 3.57 10*3/uL (ref 1.10–6.33)
Neutrophils: 71.6 %
Nucleated RBC: 0 /100 WBC (ref 0.0–0.0)
Platelets: 103 10*3/uL — ABNORMAL LOW (ref 142–346)
RBC: 3.51 10*6/uL — ABNORMAL LOW (ref 3.90–5.10)
RDW: 13 % (ref 11–15)
WBC: 4.99 10*3/uL (ref 3.10–9.50)

## 2020-11-12 LAB — COMPREHENSIVE METABOLIC PANEL
ALT: 45 U/L (ref 0–55)
AST (SGOT): 13 U/L (ref 5–41)
Albumin/Globulin Ratio: 0.9 (ref 0.9–2.2)
Albumin: 2.8 g/dL — ABNORMAL LOW (ref 3.5–5.0)
Alkaline Phosphatase: 229 U/L — ABNORMAL HIGH (ref 37–117)
Anion Gap: 13 (ref 5.0–15.0)
BUN: 83 mg/dL — ABNORMAL HIGH (ref 7.0–21.0)
Bilirubin, Total: 0.4 mg/dL (ref 0.2–1.2)
CO2: 20 mEq/L (ref 17–29)
Calcium: 7.9 mg/dL — ABNORMAL LOW (ref 8.5–10.5)
Chloride: 103 mEq/L (ref 99–111)
Creatinine: 10.6 mg/dL — ABNORMAL HIGH (ref 0.4–1.0)
Globulin: 3.2 g/dL (ref 2.0–3.6)
Glucose: 339 mg/dL — ABNORMAL HIGH (ref 70–100)
Potassium: 6.4 mEq/L (ref 3.5–5.3)
Protein, Total: 6 g/dL (ref 6.0–8.3)
Sodium: 136 mEq/L (ref 135–145)

## 2020-11-12 LAB — GLUCOSE WHOLE BLOOD - POCT
Whole Blood Glucose POCT: 271 mg/dL — ABNORMAL HIGH (ref 70–100)
Whole Blood Glucose POCT: 278 mg/dL — ABNORMAL HIGH (ref 70–100)
Whole Blood Glucose POCT: 261 mg/dL — ABNORMAL HIGH (ref 70–100)
Whole Blood Glucose POCT: 225 mg/dL — ABNORMAL HIGH (ref 70–100)
Whole Blood Glucose POCT: 330 mg/dL — ABNORMAL HIGH (ref 70–100)
Whole Blood Glucose POCT: 310 mg/dL — ABNORMAL HIGH (ref 70–100)
Whole Blood Glucose POCT: 302 mg/dL — ABNORMAL HIGH (ref 70–100)

## 2020-11-12 LAB — COVID-19 (SARS-COV-2) & INFLUENZA  A/B, NAA (ROCHE LIAT)
Influenza A: NOT DETECTED
Influenza B: NOT DETECTED
SARS CoV 2 Overall Result: NOT DETECTED

## 2020-11-12 LAB — BASIC METABOLIC PANEL
Anion Gap: 14 (ref 5.0–15.0)
BUN: 82 mg/dL — ABNORMAL HIGH (ref 7.0–21.0)
CO2: 18 mEq/L (ref 17–29)
Calcium: 8 mg/dL — ABNORMAL LOW (ref 8.5–10.5)
Chloride: 106 mEq/L (ref 99–111)
Creatinine: 10.4 mg/dL — ABNORMAL HIGH (ref 0.4–1.0)
Glucose: 290 mg/dL — ABNORMAL HIGH (ref 70–100)
Potassium: 5.2 mEq/L (ref 3.5–5.3)
Sodium: 138 mEq/L (ref 135–145)

## 2020-11-12 LAB — GFR
EGFR: 4.4
EGFR: 4.5

## 2020-11-12 LAB — TROPONIN I: Troponin I: 0.08 ng/mL — ABNORMAL HIGH (ref 0.00–0.05)

## 2020-11-12 MED ORDER — SODIUM CHLORIDE 0.9 % IV BOLUS
250.0000 mL | INTRAVENOUS | Status: AC | PRN
Start: 2020-11-12 — End: 2020-11-12

## 2020-11-12 MED ORDER — DEXTROSE 5% IV BOLUS
250.0000 mL | INTRAVENOUS | Status: DC | PRN
Start: 2020-11-12 — End: 2020-11-13

## 2020-11-12 MED ORDER — ONDANSETRON 4 MG PO TBDP
4.0000 mg | ORAL_TABLET | Freq: Four times a day (QID) | ORAL | Status: DC | PRN
Start: 2020-11-12 — End: 2020-11-13

## 2020-11-12 MED ORDER — HEPARIN SODIUM (PORCINE) 5000 UNIT/ML IJ SOLN
5000.0000 [IU] | Freq: Two times a day (BID) | INTRAMUSCULAR | Status: DC
Start: 2020-11-12 — End: 2020-11-13
  Administered 2020-11-12 – 2020-11-13 (×3): 5000 [IU] via SUBCUTANEOUS
  Filled 2020-11-12 (×3): qty 1

## 2020-11-12 MED ORDER — AMLODIPINE BESYLATE 5 MG PO TABS
10.0000 mg | ORAL_TABLET | Freq: Every day | ORAL | Status: DC
Start: 2020-11-12 — End: 2020-11-13
  Administered 2020-11-12 – 2020-11-13 (×2): 10 mg via ORAL
  Filled 2020-11-12 (×2): qty 2

## 2020-11-12 MED ORDER — DEXTROSE 50 % IV SOLN
25.0000 g | INTRAVENOUS | Status: DC | PRN
Start: 2020-11-12 — End: 2020-11-12

## 2020-11-12 MED ORDER — MELATONIN 3 MG PO TABS
3.0000 mg | ORAL_TABLET | Freq: Every evening | ORAL | Status: DC | PRN
Start: 2020-11-12 — End: 2020-11-13
  Administered 2020-11-13: 3 mg via ORAL
  Filled 2020-11-12: qty 1

## 2020-11-12 MED ORDER — CALCIUM GLUCONATE 10 % IV SOLN
1.0000 g | Freq: Once | INTRAVENOUS | Status: AC
Start: 2020-11-12 — End: 2020-11-12
  Administered 2020-11-12: 06:00:00 1 g via INTRAVENOUS
  Filled 2020-11-12: qty 10

## 2020-11-12 MED ORDER — DEXTROSE 10 % IV BOLUS
25.0000 g | INTRAVENOUS | Status: DC | PRN
Start: 2020-11-12 — End: 2020-11-13

## 2020-11-12 MED ORDER — LOPERAMIDE HCL 2 MG PO CAPS
2.0000 mg | ORAL_CAPSULE | ORAL | Status: DC | PRN
Start: 2020-11-16 — End: 2020-11-13

## 2020-11-12 MED ORDER — LOPERAMIDE HCL 2 MG PO CAPS
4.0000 mg | ORAL_CAPSULE | ORAL | Status: DC | PRN
Start: 2020-11-12 — End: 2020-11-13

## 2020-11-12 MED ORDER — ACETAMINOPHEN 325 MG PO TABS
650.0000 mg | ORAL_TABLET | Freq: Four times a day (QID) | ORAL | Status: DC | PRN
Start: 2020-11-12 — End: 2020-11-13
  Administered 2020-11-12 – 2020-11-13 (×2): 650 mg via ORAL
  Filled 2020-11-12 (×2): qty 2

## 2020-11-12 MED ORDER — CARVEDILOL 25 MG PO TABS
25.0000 mg | ORAL_TABLET | Freq: Every day | ORAL | Status: DC
Start: 2020-11-12 — End: 2020-11-13
  Administered 2020-11-12: 09:00:00 25 mg via ORAL
  Filled 2020-11-12 (×2): qty 1

## 2020-11-12 MED ORDER — HYDRALAZINE HCL 20 MG/ML IJ SOLN
10.0000 mg | Freq: Four times a day (QID) | INTRAMUSCULAR | Status: DC | PRN
Start: 2020-11-12 — End: 2020-11-13
  Administered 2020-11-13: 10 mg via INTRAVENOUS
  Filled 2020-11-12 (×2): qty 1

## 2020-11-12 MED ORDER — DEXTROSE 10 % IV BOLUS
50.0000 g | Freq: Once | INTRAVENOUS | Status: DC
Start: 2020-11-12 — End: 2020-11-12
  Administered 2020-11-12: 06:00:00 500 mL via INTRAVENOUS

## 2020-11-12 MED ORDER — MAGNESIUM SULFATE IN D5W 1-5 GM/100ML-% IV SOLN
1.0000 g | INTRAVENOUS | Status: DC | PRN
Start: 2020-11-12 — End: 2020-11-13

## 2020-11-12 MED ORDER — POTASSIUM CHLORIDE 10 MEQ/100ML IV SOLN
10.0000 meq | INTRAVENOUS | Status: DC | PRN
Start: 2020-11-12 — End: 2020-11-13

## 2020-11-12 MED ORDER — DEXTROSE 5% IV BOLUS
250.0000 mL | INTRAVENOUS | Status: DC | PRN
Start: 2020-11-12 — End: 2020-11-12

## 2020-11-12 MED ORDER — SODIUM CHLORIDE 0.9 % IV BOLUS
100.0000 mL | INTRAVENOUS | Status: AC | PRN
Start: 2020-11-12 — End: 2020-11-12

## 2020-11-12 MED ORDER — NALOXONE HCL 0.4 MG/ML IJ SOLN (WRAP)
0.2000 mg | INTRAMUSCULAR | Status: DC | PRN
Start: 2020-11-12 — End: 2020-11-13

## 2020-11-12 MED ORDER — GLUCAGON 1 MG IJ SOLR (WRAP)
1.0000 mg | INTRAMUSCULAR | Status: DC | PRN
Start: 2020-11-12 — End: 2020-11-13

## 2020-11-12 MED ORDER — HYDRALAZINE HCL 20 MG/ML IJ SOLN
10.0000 mg | Freq: Once | INTRAMUSCULAR | Status: AC
Start: 2020-11-12 — End: 2020-11-12
  Administered 2020-11-12: 05:00:00 10 mg via INTRAVENOUS
  Filled 2020-11-12: qty 1

## 2020-11-12 MED ORDER — ALBUMIN HUMAN 25 % IV SOLN
100.0000 mL | INTRAVENOUS | Status: AC | PRN
Start: 2020-11-12 — End: 2020-11-12

## 2020-11-12 MED ORDER — DEXTROSE 50 % IV SOLN
25.0000 g | INTRAVENOUS | Status: DC | PRN
Start: 2020-11-12 — End: 2020-11-13

## 2020-11-12 MED ORDER — LABETALOL HCL 5 MG/ML IV SOLN (WRAP)
20.0000 mg | Freq: Once | INTRAVENOUS | Status: AC
Start: 2020-11-12 — End: 2020-11-12
  Administered 2020-11-12: 07:00:00 20 mg via INTRAVENOUS
  Filled 2020-11-12: qty 4

## 2020-11-12 MED ORDER — DEXTROSE 10 % IV BOLUS
25.0000 g | INTRAVENOUS | Status: DC | PRN
Start: 2020-11-12 — End: 2020-11-12

## 2020-11-12 MED ORDER — INSULIN LISPRO 100 UNIT/ML SOLN (WRAP)
1.0000 [IU] | Freq: Three times a day (TID) | Status: DC
Start: 2020-11-12 — End: 2020-11-13
  Administered 2020-11-12: 16:00:00 4 [IU] via SUBCUTANEOUS
  Administered 2020-11-13 (×2): 2 [IU] via SUBCUTANEOUS
  Filled 2020-11-12: qty 12
  Filled 2020-11-12 (×2): qty 6

## 2020-11-12 MED ORDER — ACETAMINOPHEN 650 MG RE SUPP
650.0000 mg | Freq: Four times a day (QID) | RECTAL | Status: DC | PRN
Start: 2020-11-12 — End: 2020-11-13

## 2020-11-12 MED ORDER — INSULIN LISPRO 100 UNIT/ML SOLN (WRAP)
1.0000 [IU] | Freq: Every evening | Status: DC
Start: 2020-11-12 — End: 2020-11-13
  Administered 2020-11-12: 2 [IU] via SUBCUTANEOUS
  Filled 2020-11-12: qty 6

## 2020-11-12 MED ORDER — POTASSIUM & SODIUM PHOSPHATES 280-160-250 MG PO PACK
2.0000 | PACK | ORAL | Status: DC | PRN
Start: 2020-11-12 — End: 2020-11-13

## 2020-11-12 MED ORDER — ONDANSETRON HCL 4 MG/2ML IJ SOLN
4.0000 mg | Freq: Four times a day (QID) | INTRAMUSCULAR | Status: DC | PRN
Start: 2020-11-12 — End: 2020-11-13

## 2020-11-12 MED ORDER — GLUCAGON 1 MG IJ SOLR (WRAP)
1.0000 mg | INTRAMUSCULAR | Status: DC | PRN
Start: 2020-11-12 — End: 2020-11-12

## 2020-11-12 MED ORDER — POTASSIUM CHLORIDE CRYS ER 20 MEQ PO TBCR
0.0000 meq | EXTENDED_RELEASE_TABLET | ORAL | Status: DC | PRN
Start: 2020-11-12 — End: 2020-11-13

## 2020-11-12 MED ORDER — SODIUM BICARBONATE 8.4 % IV SOLN
50.0000 meq | Freq: Once | INTRAVENOUS | Status: AC
Start: 2020-11-12 — End: 2020-11-12
  Administered 2020-11-12: 06:00:00 50 meq via INTRAVENOUS
  Filled 2020-11-12: qty 50

## 2020-11-12 MED ORDER — SODIUM POLYSTYRENE SULFONATE 15 GM/60ML PO SUSP
30.0000 g | Freq: Once | ORAL | Status: AC
Start: 2020-11-12 — End: 2020-11-12
  Administered 2020-11-12: 06:00:00 30 g via ORAL
  Filled 2020-11-12: qty 120

## 2020-11-12 MED ORDER — INSULIN SYRINGES (DISPOSABLE) U-100 1 ML MISC
5.0000 [IU] | Freq: Once | Status: AC
Start: 2020-11-12 — End: 2020-11-12
  Administered 2020-11-12: 06:00:00 5 [IU] via INTRAVENOUS
  Filled 2020-11-12: qty 15

## 2020-11-12 NOTE — ED Notes (Signed)
Pt assisted off bedside commode, back to bed lined with chucks.

## 2020-11-12 NOTE — ED Notes (Signed)
[place]  ED NURSING NOTE FOR THE RECEIVING INPATIENT NURSE   ED NURSE Mija Effertz    [facility] [phone]   ED CHARGE RN [phone]   ADMISSION INFORMATION   [name] is a 65 y.o. female admitted with a diagnosis of:    No diagnosis found.     Isolation: None   Allergies: Lisinopril and Metformin   Holding Orders confirmed? Yes   Belongings Documented? No   Home medications sent to pharmacy confirmed? No   NURSING CARE   Patient Comes From:   Mental Status: Home Independent  alert and oriented   ADL: Independent with all ADLs   Ambulation: mild difficulty   Pertinent Information  and Safety Concerns: K6.4 BP missed dialysis X3 sessions      COVID Test sent to lab? Yes   VITAL SIGNS   Time BP Temp Pulse Resp SpO2   0700 198/84 98.1 71 18 95   CT / NIH   CT Head ordered on this patient?  No   NIH/Dysphagia assessment done prior to admission? No   PERSONAL PROTECTIVE EQUIPMENT   None   LAB RESULTS   Labs Reviewed   CBC AND DIFFERENTIAL - Abnormal; Notable for the following components:       Result Value    Hgb 10.5 (*)     Hematocrit 34.2 (*)     Platelets 103 (*)     RBC 3.51 (*)     MCV 97.4 (*)     MCHC 30.7 (*)     MPV 12.8 (*)     All other components within normal limits   TROPONIN I - Abnormal; Notable for the following components:    Troponin I 0.08 (*)     All other components within normal limits   COMPREHENSIVE METABOLIC PANEL - Abnormal; Notable for the following components:    Glucose 339 (*)     BUN 83.0 (*)     Creatinine 10.6 (*)     Potassium 6.4 (*)     Calcium 7.9 (*)     Albumin 2.8 (*)     Alkaline Phosphatase 229 (*)     All other components within normal limits   GLUCOSE WHOLE BLOOD - POCT - Abnormal; Notable for the following components:    Whole Blood Glucose POCT 271 (*)     All other components within normal limits   GLUCOSE WHOLE BLOOD - POCT - Abnormal; Notable for the following components:    Whole Blood Glucose POCT 278 (*)     All other components within normal  limits   COVID-19 (SARS-COV-2) & INFLUENZA  A/B, NAA (ROCHE LIAT)    Narrative:     o Collect and clearly label specimen type:  o PREFERRED-Upper respiratory specimen: One Nasal Swab in  [employer].  o Hand deliver to laboratory ASAP  Diagnostic -PUI   GFR   GFR          Ticket to Ride Printed: Yes

## 2020-11-12 NOTE — ED Notes (Signed)
Bed: PU35  Expected date:   Expected time:   Means of arrival:   Comments:  M,205

## 2020-11-12 NOTE — Consults (Signed)
[facility]  CONSULT  [id number]      Date Time: 11/12/20 10:32 AM  Patient Name: [name]  Requesting Physician: Lynnea Ferrier, Lindell Noe,*  Consulting Physician: Gaylyn Lambert, MD, MD    Primary Care Physician: Pcp, None, MD    Reason for Consultation: ESRD       Assessment:     Patient Active Problem List   Diagnosis    Iron deficiency anemia secondary to inadequate dietary iron intake    Sideropenic dysphagia    Peritoneal dialysis catheter dysfunction, initial encounter    Peritonitis    Upper GI bleed    ESRD (end stage renal disease) on dialysis    Generalized abdominal pain    Thrombocytopenia    h/o orthostatic hypotension    Type 2 diabetes mellitus, with long-term current use of insulin    GERD (gastroesophageal reflux disease)    Bowel incontinence    Right sided numbness    Uncontrolled type 2 diabetes mellitus with hyperglycemia    Hypertension    Hyperlipidemia    History of COPD    Gastritis with hemorrhage    History of anemia due to chronic kidney disease    Open wound, abdominal wall, lateral, subsequent encounter    Gout    Dizziness    Fall    History of CVA (cerebrovascular accident)    Pneumonia due to COVID-19 virus    Diarrhea    Hypertensive urgency    Shortness of breath    ESRD (end stage renal disease)       1. ESRD on HD MWF at [facility] under Dr Corky Mull.   -missed 1 week of HD  2. Hyperkalemia - K 6.4   3. Metabolic acidosis due to ESRD  4. Anemia of CKD  5. HTN emergency 224/102  6. Volume Overload  7. Diarrhea and Vomiting.     Recommendations:     1. Stat HD today with 3L UF   2. Plan for HD again tomorrow   3. Iron stores adequate, hold off on EPO until BP controlled.   4. Cont home BP meds.   5. Expect BP to improve with UF         Lynnea Ferrier, Majid,*, thank you for this consultation.  We will follow the patient with you during this hospitalization.  Please contact me with any questions or issues.    Signed by: Gaylyn Lambert, MD  [facility]  [id number] (office)      -----------------------------------------------------------------------------------------------------------        History of Presenting Illness:     [name] is a 65 y.o. female with ESRD on HD MWF at [facility], DM type 2, HTN, HLD who presented to the hospital after missing HD x 1 week. She has been having nausea, vomiting and diarrhea for a week and has been unable to go to HD unit. She came to the ER with hypertensive emergency and end organ damage. Her BP was 224/102. She had hyperkalemia with K 6.4. She still makes some urine. We are consulted for emergent HD.     Past Medical History:     Past Medical History:   Diagnosis Date    Chronic obstructive pulmonary disease     Diabetes mellitus     Hyperlipidemia     Hypertension     Sleep apnea 2018       Available old records reviewed, including:  notes, rads, lab.  Past Surgical History:     Past Surgical History:   Procedure Laterality Date    EGD, BIOPSY N/A 05/14/2019    Procedure: EGD, BIOPSY;  Surgeon: Ronie Spies, MD;  Location: [facility];  Service: Gastroenterology;  Laterality: N/A;    EXPLORATORY LAPAROTOMY N/A 05/27/2019    Procedure: EXPLORATORY LAPAROTOMY;  Surgeon: Herbert Moors., MD;  Location: [facility];  Service: General;  Laterality: N/A;    JOINT REPLACEMENT      shoulder    JOINT REPLACEMENT      knee    OVARY SURGERY Left     Removal    REMOVAL, FOREIGN BODY N/A 05/27/2019    Procedure: REMOVAL, PERITONEAL DIALYSIS CATHETER;  Surgeon: Herbert Moors., MD;  Location: [facility];  Service: General;  Laterality: N/A;    TUNNELED CATH CHECK/CHANGE (PERMCATH) N/A 07/03/2019    Procedure: TUNNELED CATH CHECK/CHANGE;  Surgeon: Maureen Ralphs, MD;  Location: [facility];  Service: Interventional Radiology;  Laterality: N/A;    TUNNELED CATH PLACEMENT (PERMCATH) Bilateral 05/23/2019    Procedure: TUNNELED CATH PLACEMENT;  Surgeon: Jacques Earthly, MD;   Location: [facility];  Service: Interventional Radiology;  Laterality: Bilateral;    TUNNELED CATH PLACEMENT (PERMCATH) Right 07/31/2019    Procedure: TUNNELED CATH PLACEMENT;  Surgeon: Jacques Earthly, MD;  Location: [facility];  Service: Interventional Radiology;  Laterality: Right;       Family History:     Family History   Problem Relation Age of Onset    Diabetes Mother     Hypertension Mother     Diabetes Father     Hypertension Father     Diabetes Sister     Hypertension Sister     Diabetes Brother     Hypertension Brother     Diabetes Paternal Aunt     Diabetes Paternal Uncle        Social History:     Social History     Socioeconomic History    Marital status: Legally Separated     Spouse name: Not on file    Number of children: Not on file    Years of education: Not on file    Highest education level: Not on file   Occupational History    Not on file   Tobacco Use    Smoking status: Never    Smokeless tobacco: Never   Vaping Use    Vaping Use: Never used   Substance and Sexual Activity    Alcohol use: Never    Drug use: Never    Sexual activity: Not Currently   Other Topics Concern    Not on file   Social History Narrative    Not on file     Social Determinants of Health     Financial Resource Strain: Not on file   Food Insecurity: Not on file   Transportation Needs: Not on file   Physical Activity: Not on file   Stress: Not on file   Social Connections: Not on file   Intimate Partner Violence: Not on file   Housing Stability: Not on file       Allergies:     Allergies   Allergen Reactions    Lisinopril Hives    Metformin Hives       Medications:   (Not in a hospital admission)       Scheduled Meds: PRN Meds:    amLODIPine, 10 mg, Oral, Daily  carvedilol, 25 mg,  Oral, Daily  heparin (porcine), 5,000 Units, Subcutaneous, Q12H [place]  insulin lispro, 1-3 Units, Subcutaneous, QHS  insulin lispro, 1-5 Units, Subcutaneous, TID AC          Continuous Infusions:   acetaminophen, 650 mg, Q6H PRN   Or  acetaminophen,  650 mg, Q6H PRN  albumin human, 100 mL, PRN  glucagon (rDNA), 1 mg, PRN   And  dextrose, 250 mL, PRN   And  dextrose, 25 g, PRN   And  dextrose, 25 g, PRN  glucagon (rDNA), 1 mg, PRN   And  dextrose, 250 mL, PRN   And  dextrose, 25 g, PRN   And  dextrose, 25 g, PRN  hydrALAZINE, 10 mg, Q6H PRN  loperamide, 4 mg, Q4H PRN   Followed by  Derrill Memo ON 11/16/2020] loperamide, 2 mg, Q2H PRN  magnesium sulfate, 1 g, PRN  melatonin, 3 mg, QHS PRN  naloxone, 0.2 mg, PRN  ondansetron, 4 mg, Q6H PRN   Or  ondansetron, 4 mg, Q6H PRN  potassium & sodium phosphates, 2 packet, PRN  potassium chloride, 0-40 mEq, PRN   And  potassium chloride, 10 mEq, PRN  sodium chloride, 100 mL, Q1H PRN  sodium chloride, 250 mL, PRN            Review of Systems:   A comprehensive review of systems was per the HPI and below:     General ROS: no f/c, no weight changes  HEENT ROS: no blurry vision, no oral lesions, no epistaxis  Allergy/Immunology ROS: no new allergic reactions  Hematological and Lymphatic ROS: no known bleeding/clotting disorders  Respiratory ROS: negative for cough, shortness of breath, or wheezing  Cardiovascular ROS: negative for chest pain or dyspnea on exertion  Gastrointestinal ROS: negative for abd/flank pain, change in bowel habits  Genito-Urinary ROS: negative for dysuria, hematuria, difficulty voiding or nocturia  Musculoskeletal ROS:  negative for trauma or falls, arthralgias  Neurological ROS: no focal weakness, no dizziness  Endocrine ROS: no change in libido, no change in hair distribution  Dermatological ROS: no new rashes or lesions      Physical Exam:     Vitals:    11/12/20 0930 11/12/20 0945 11/12/20 1000 11/12/20 1015   BP: 167/79 177/80 157/65 146/73   Pulse: 73 75 73 70   Resp: '20 20 20 20   '$ Temp:       SpO2:           Intake and Output Summary (Last 24 hours) at Date Time  No intake or output data in the 24 hours ending 11/12/20 1032    Recent weights:  Weight Monitoring 01/22/2020 10/20/2020 10/21/2020 10/21/2020  10/22/2020 10/23/2020 10/25/2020   Height - 157.5 cm 157.5 cm - - - -   Height Method - Stated - - - - -   Weight 98.6 kg 100 kg 108 kg 107 kg 108.2 kg 108.1 kg 80.876 kg   Weight Method Bed Scale Estimated - Bed Scale Bed Scale Bed Scale Bed Scale   BMI (calculated) - 40.4 kg/m2 43.6 kg/m2 - - - -       General: awake, alert, oriented x 3, no acute distress.  HEENT: sclera anicteric, oropharynx clear without lesions, mucous membranes moist  Neck: supple, no lymphadenopathy, no thyromegaly, no JVD, no carotid bruits  Cardiovascular: regular rate and rhythm, no murmurs, rubs or gallops  Lungs: clear to auscultation bilaterally, without wheezing, rhonchi, or rales  Abdomen: soft, non-tender, non-distended, normoactive bowel sounds, no  palpable masses, no hepatosplenomegaly, no rebound or guarding  Extremities: no clubbing, cyanosis, or edema  Neuro: A+O x 3, no gross motor/sensory deficits  Skin: no rashes or lesions noted    Access: LUE AVF       Labs:     Recent Labs   Lab 11/12/20  0226   WBC 4.99   Hgb 10.5*   Hematocrit 34.2*   Platelets 103*     Recent Labs   Lab 11/12/20  0914 11/12/20  0440   Sodium 138 136   Potassium 5.2 6.4*   Chloride 106 103   CO2 18 20   BUN 82.0* 83.0*   Creatinine 10.4* 10.6*   Calcium 8.0* 7.9*   Albumin  --  2.8*   Glucose 290* 339*   EGFR 4.5 4.4             Invalid input(s): LEUKOCYTESUR        Imaging personally reviewed, including: N/A       cc: Lynnea Ferrier, Majid,*  Pcp, None, MD

## 2020-11-12 NOTE — ED Notes (Signed)
Dialysis called, est time for transport 0900 as pt is to receive anti-HTN meds and has recently received kayexalate.

## 2020-11-12 NOTE — Progress Notes (Signed)
Rn in pt room. Pt on the phone states she is done taking drugs because she wants to live. Last stated drug use was 3 weeks ago.

## 2020-11-12 NOTE — Consults (Signed)
[facility]    Date Time: 11/12/20 9:56 AM  Date of Admission:   11/12/2020   Patient Name: [name], [name]  MRN:  [id number]  CSN:  [id number]  Requesting Physician: Lynnea Ferrier, Lindell Noe,*  Consulting Physician:  Mellody Dance, MD        Reason for Consultation:   Norman Herrlich = 0.8    History:   [name] is a 65 y.o. female admitted on 11/12/2020.  We have been asked by Lynnea Ferrier, Majid,*,  to provide cardiac consultation, regarding an abnormal troponin.    Patient has a history of end-stage renal disease and is on chronic dialysis.  Patient received her flu vaccine about 10 days ago and subsequent to that developed malaise fever diarrhea and nausea.  She missed dialysis for an entire week and ultimately came to the emergency department today with marked fluid overload.  She had numerous blood tests drawn including a random troponin for reasons which are not clear.  That troponin was minimally elevated.    The patient is entirely asymptomatic from a cardiovascular perspective.  Specifically, the patient denies any exertional chest pain, pressure, heaviness, tightness, squeezing or burning sensations. The patient denies any shortness of breath, orthopnea, nocturnal dyspnea, or peripheral edema. The patient denies any palpitations, lightheadedness or syncope. Overall, the patient appears to be doing relatively well.    The patient has no known history of heart disease and specifically denies any history of CAD, MI, CHF, Valvular Heart disease, Heart Murmurs, Dysrhythmias or Cardiomyopathy.    As a result of a randomly drawn troponin in an asymptomatic patient, we have been asked to assess the patient's cardiac status and guide her management.    Past Medical History:     Past Medical History:   Diagnosis Date    Chronic obstructive pulmonary disease     Diabetes mellitus     Hyperlipidemia     Hypertension     Sleep apnea 2018        Past Surgical History:     Past Surgical History:   Procedure Laterality Date    EGD, BIOPSY N/A 05/14/2019    Procedure: EGD, BIOPSY;  Surgeon: Ronie Spies, MD;  Location: [facility];  Service: Gastroenterology;  Laterality: N/A;    EXPLORATORY LAPAROTOMY N/A 05/27/2019    Procedure: EXPLORATORY LAPAROTOMY;  Surgeon: Herbert Moors., MD;  Location: [facility];  Service: General;  Laterality: N/A;    JOINT REPLACEMENT      shoulder    JOINT REPLACEMENT      knee    OVARY SURGERY Left     Removal    REMOVAL, FOREIGN BODY N/A 05/27/2019    Procedure: REMOVAL, PERITONEAL DIALYSIS CATHETER;  Surgeon: Herbert Moors., MD;  Location: [facility];  Service: General;  Laterality: N/A;    TUNNELED CATH CHECK/CHANGE ([facility]ATH) N/A 07/03/2019    Procedure: TUNNELED CATH CHECK/CHANGE;  Surgeon: Maureen Ralphs, MD;  Location: [facility];  Service: Interventional Radiology;  Laterality: N/A;    TUNNELED CATH PLACEMENT ([facility]ATH) Bilateral 05/23/2019    Procedure: TUNNELED CATH PLACEMENT;  Surgeon: Jacques Earthly, MD;  Location: [facility];  Service: Interventional Radiology;  Laterality: Bilateral;    TUNNELED CATH PLACEMENT ([facility]ATH) Right 07/31/2019    Procedure: TUNNELED CATH PLACEMENT;  Surgeon: Jacques Earthly, MD;  Location: [facility];  Service: Interventional Radiology;  Laterality: Right;       Family History:  Family History   Problem Relation Age of Onset    Diabetes Mother     Hypertension Mother     Diabetes Father     Hypertension Father     Diabetes Sister     Hypertension Sister     Diabetes Brother     Hypertension Brother     Diabetes Paternal Aunt     Diabetes Paternal Uncle        Social History:     Social History     Socioeconomic History    Marital status: Legally Separated     Spouse name: Not on file    Number of children: Not on file    Years of education: Not on file    Highest education level: Not on file   Occupational History    Not on file   Tobacco Use    Smoking  status: Never    Smokeless tobacco: Never   Vaping Use    Vaping Use: Never used   Substance and Sexual Activity    Alcohol use: Never    Drug use: Never    Sexual activity: Not Currently   Other Topics Concern    Not on file   Social History Narrative    Not on file     Social Determinants of Health     Financial Resource Strain: Not on file   Food Insecurity: Not on file   Transportation Needs: Not on file   Physical Activity: Not on file   Stress: Not on file   Social Connections: Not on file   Intimate Partner Violence: Not on file   Housing Stability: Not on file       Allergies:     Allergies   Allergen Reactions    Lisinopril Hives    Metformin Hives       Medications:   (Not in a hospital admission)       Current Facility-Administered Medications   Medication Dose Route Frequency    amLODIPine  10 mg Oral Daily    carvedilol  25 mg Oral Daily    heparin (porcine)  5,000 Units Subcutaneous Q12H [place]    insulin lispro  1-3 Units Subcutaneous QHS    insulin lispro  1-5 Units Subcutaneous TID AC         Review of Systems:    Comprehensive review of systems including constitutional, eyes, ears, nose, mouth, throat, cardiovascular, GI, GU, musculoskeletal, integumentary, respiratory, neurologic, psychiatric, and endocrine is negative other than what is mentioned already in the history of present illness    Physical Exam:     VITAL SIGNS PHYSICAL EXAM   Vitals:    11/12/20 0945   BP: 177/80   Pulse: 75   Resp: 20   Temp:    SpO2:      Temp (24hrs), Avg:98 F (36.7 C), Min:98 F (36.7 C), Max:98.1 F (36.7 C)      Intake and Output Summary (Last 24 hours) at Date Time  No intake or output data in the 24 hours ending 11/12/20 0956    Telemetry: Reviewed Physical Exam  General: awake, alert and no acute distress.  Seen on dialysis after completing 3-1/2 hours of hemodialysis  Head: normocephalic  Eyes: Lid & Conjunctiva Normal but edema evident  Neck: JVP is normal;  No carotid bruit  Cardiovascular:   Regular rate  and rhythm, normal S1, S2, no S3, no S4, no murmurs, rubs or gallops  Lungs: clear to auscultation bilateraly,  without wheezing, rhonchi, or rales  Abdomen: soft, non-tender,  Extremities:   2+ edema   no erythema  Pulses:  2+ and equal bilaterally   Neurological: Sensory and motor grossly intact  Musculoskeletal:   grossly normal strength and tone  Psychiatric: Alert and Ox3;  Appropriate affect for the circumstances     Labs Reviewed:     Recent Labs   Lab 11/12/20  0440   Troponin I 0.08*             Recent Labs   Lab 11/12/20  0440   Bilirubin, Total 0.4   Protein, Total 6.0   Albumin 2.8*   ALT 45   AST (SGOT) 13             Recent Labs   Lab 11/12/20  0226   WBC 4.99   Hgb 10.5*   Hematocrit 34.2*   Platelets 103*     Recent Labs   Lab 11/12/20  0440   Sodium 136   Potassium 6.4*   Chloride 103   CO2 20   BUN 83.0*   Creatinine 10.6*   EGFR 4.4   Glucose 339*   Calcium 7.9*       DIAGNOSTIC    EKG: Normal sinus rhythm at 81 bpm.  RSR prime in V1.  Nonspecific ST changes      Assessment:   Nondiagnostic troponin in the setting of acute on chronic renal failure (Cr=10.6)  NB: there was no indication to check a troponin  This is NOT an acute coronary syndrome  Noncompliance with dialysis for over a week due to illness  Possible reaction to flu vaccine  Hyperkalemia  Hypertension  Diabetes mellitus  Obstructive sleep apnea on CPAP  Elevated BMI = 32.61  Normal LVEF on Echo of 06/2019    Recommendations:   Dialysis  Echo next week when effectively dialyzed  Discussion:  Cardiac Troponin is a highly sensitive biomarker of myocardial cell damage, which is often, but not always, due to ischemic heart disease.  While Troponin is highly specific for myocardial cell release, it is not specific  for acute coronary syndrome as a cause.     Elevated Troponin level can be seen in a variety of nonthrombotic diseases such as sepsis, atrial fibrillation, heart failure, pulmonary embolism, myocarditis, myocardial contusion, and  renal failure, which in this presentation is certainly the case.      Troponin elevations have been reported in a variety of clinical scenarios other than an acute thrombotic occlusion of a coronary artery.  These clinical scenarios are often acute medical problems or serious chronic illnesses.  There are 3 major categories:  1) Myocardial damage related to supply/demand mismatch;  2) Myocardial damage related to nonischemic causes such as myocarditis or direct trauma;  3)  Myocardial injury that is multifactorial or of indeterminate cause.  None of these apply to this patient    Troponin levels can be elevated in tachycardia or bradycardia arrhythmias, heart block, critically ill patients, hypertrophic cardiomyopathy, coronary vasospasm, acute neurologic disease, rhabdomyolysis, congestive heart failure (both acute and chronic), pulmonary embolism, renal failure, aortic valve disease, apical ballooning, infiltrative diseases, inflammatory diseases, drug toxicity, and high levels of physical exertion.  Some of the most noncoronary causes of elevated troponin include tachycardia (28%), pericarditis (10%) Heart Failure (5%) Strenuous exercise (10%) and no clear precipitating event (47%).  Recently, both Covid infection and Covid Vaccination has been shown to potentially elevate troponin levels, presumptively by causing an inflammatory  myocarditis, but again this does not apply to this patient.    Thank you again for allowing [place] to participate in this patient's care.  We are available if the need arises but an ischemic work-up is not indicated at this time.           Signed by: Mellody Dance, MD    [place] [phone] (8am-5pm)  NP [place] [phone] (8am-2pm)   After hours, non urgent consult line [phone]  After Hours, urgent consults [phone]  Office/Pager: [phone]

## 2020-11-12 NOTE — Plan of Care (Addendum)
NURSING SHIFT NOTE     Patient: [name]  Day: 0      SHIFT EVENTS     Shift Narrative/Significant Events (PRN med administration, fall, RRT, etc.):      Pt admitted to the unit today straight to dialysis- 3.1 L taken off per Dialysis RN.   Pt A&Ox4- able to make needs known.  Pt c/o headache, medicated with PRN meds, No SOB but dizziness on ambulation. Pt placed on high fail risk, call bell in reach and bed side table accessible. POC includes monitor I&O, pain, VS and telemetry.  - POC reviewed w/ pt during MD trio rounding, all concerns addressed.  All required safety measures in place, hourly rounding continues.        Safety and fall precautions remain in place. Purposeful rounding completed.          ASSESSMENT     Changes in assessment from patient's baseline this shift:    Neuro: No  CV: No  Pulm: No  Peripheral Vascular: No  HEENT: No  GI: No  BM during shift: No, Last BM: Last BM Date: 11/12/20  GU: No   Integ: No  MS: No    Pain: Improved  Pain Interventions: Medications and Rest  Medications Utilized: Tylenol     Mobility: PMP Activity: Step 5 - Chair of Distance Walked (ft) (Step 6,7): 10 Feet           Lines     Patient Lines/Drains/Airways Status       Active Lines, Drains and Airways       Name Placement date Placement time Site Days    Peripheral IV 11/12/20 22 G Standard Right Hand 11/12/20  0330  Hand  less than 1    External Urinary Catheter 11/12/20  1330  no documentation  less than 1    Graft/Fistula Left no documentation  no documentation  no documentation no documentation                         VITAL SIGNS     Vitals:    11/12/20 1600   BP: 162/71   Pulse: 80   Resp: 18   Temp: 97.5 F (36.4 C)   SpO2: 96%       Temp  Min: 97.5 F (36.4 C)  Max: 98.1 F (36.7 C)  Pulse  Min: 68  Max: 84  Resp  Min: 15  Max: 24  BP  Min: 135/61  Max: 224/102  SpO2  Min: 93 %  Max: 98 %      Intake/Output Summary (Last 24 hours) at 11/12/2020 1625  Last data filed at 11/12/2020 1300  Gross per 24  hour   Intake no documentation   Output 3100 ml   Net -3100 ml            CARE PLAN        Problem: Renal Instability  Goal: Free from infection  Outcome: Progressing     Problem: Patient Receiving Advanced Renal Therapies  Goal: Therapy access site remains intact  Outcome: Progressing  Flowsheets (Taken 11/12/2020 0946 by Erin Hearing, RN)  Therapy access site remains intact:   Change therapy access site dressing as needed   Assess therapy access site     Problem: Safety  Goal: Patient will be free from infection during hospitalization  Outcome: Progressing  Flowsheets (Taken 11/12/2020 1624)  Free from Infection during hospitalization:   Assess  and monitor for signs and symptoms of infection   Monitor lab/diagnostic results   Monitor all insertion sites (i.e. indwelling lines, tubes, urinary catheters, and drains)   Encourage patient and family to use good hand hygiene technique     Problem: Pain  Goal: Pain at adequate level as identified by patient  Outcome: Progressing  Flowsheets (Taken 11/12/2020 1624)  Pain at adequate level as identified by patient:   Identify patient comfort function goal   Assess for risk of opioid induced respiratory depression, including snoring/sleep apnea. Alert healthcare team of risk factors identified.   Assess pain on admission, during daily assessment and/or before any "as needed" intervention(s)   Reassess pain within 30-60 minutes of any procedure/intervention, per Pain Assessment, Intervention, Reassessment (AIR) Cycle     Problem: Side Effects from Pain Analgesia  Goal: Patient will experience minimal side effects of analgesic therapy  Outcome: Progressing  Flowsheets (Taken 11/12/2020 1624)  Patient will experience minimal side effects of analgesic therapy:   Monitor/assess patient's respiratory status (RR depth, effort, breath sounds)   Assess for changes in cognitive function   Prevent/manage side effects per LIP orders (i.e. nausea, vomiting, pruritus, constipation,  urinary retention, etc.)     Problem: Discharge Barriers  Goal: Patient will be discharged home or other facility with appropriate resources  Outcome: Progressing  Flowsheets (Taken 11/12/2020 1624)  Discharge to home or other facility with appropriate resources:   Provide information on available health resources   Provide appropriate patient education   Initiate discharge planning     Problem: Psychosocial and Spiritual Needs  Goal: Demonstrates ability to cope with hospitalization/illness  Outcome: Progressing  Flowsheets (Taken 11/12/2020 1624)  Demonstrates ability to cope with hospitalizations/illness:   Encourage verbalization of feelings/concerns/expectations   Assist patient to identify own strengths and abilities   Provide quiet environment   Encourage patient to set small goals for self     Problem: Moderate/High Fall Risk Score >5  Goal: Patient will remain free of falls  Outcome: Progressing  Flowsheets (Taken 11/12/2020 1624)  VH High Risk (Greater than 13):   ALL REQUIRED LOW INTERVENTIONS   ALL REQUIRED MODERATE INTERVENTIONS   RED "HIGH FALL RISK" SIGNAGE   BED ALARM WILL BE ACTIVATED WHEN THE PATEINT IS IN BED WITH SIGNAGE "RESET BED ALARM"   A CHAIR PAD ALARM WILL BE USED WHEN PATIENT IS UP SITTING IN A CHAIR   PATIENT IS TO BE SUPERVISED FOR ALL TOILETING ACTIVITIES   A safety companion may be used when deemed appropriate by the Primary RN and Clinical Administrator   Request PT/OT therapy consult order from physician for patients with gait/mobility impairment

## 2020-11-12 NOTE — Plan of Care (Signed)
Problem: Renal Instability  Goal: Fluid and electrolyte balance are achieved/maintained  Outcome: Progressing  Flowsheets (Taken 11/12/2020 0946)  Fluid and electrolyte balance are achieved/maintained:   Monitor intake and output every shift   Assess and reassess fluid and electrolyte status  Goal: Free from infection  Outcome: Progressing  Flowsheets (Taken 11/12/2020 0946)  Free from infection:   Assess need for indwelling catheter every shift and discuss with LIP   Monitor/assess for signs and symptoms of infection     Problem: Patient Receiving Advanced Renal Therapies  Goal: Therapy access site remains intact  Outcome: Progressing  Flowsheets (Taken 11/12/2020 0946)  Therapy access site remains intact:   Change therapy access site dressing as needed   Assess therapy access site

## 2020-11-12 NOTE — Progress Notes (Signed)
[facility] PROGRESS NOTE  [id number]      Date Time: 11/12/20 10:44 AM  Patient Name: [name]  Attending Physician: Lynnea Ferrier, Lindell Noe,*    CC: follow-up ESRD     HD note     Assessment:     1. ESRD on HD MWF at [facility] under Dr Corky Mull.   -missed 1 week of HD  2. Hyperkalemia - K 6.4   3. Metabolic acidosis due to ESRD  4. Anemia of CKD  5. HTN emergency 224/102  6. Volume Overload  7. Diarrhea and Vomiting.     Recommendations:     Seen on HD     Dialysis Treatment Data:  Blood flow rate: 400 mL/min  Dialysate flow rate: 700 mL/min  Ultrafiltration rate: 1000 mL/hr    Dialysate potassium: 1 mEq/L  Dialysate calcium: 2.5 mEq/L    Arterial pressure: -193 mmHg  Venous pressure: 257mHg       Case discussed with: RN, HD nurse  and patient.       AGaylyn Lambert MD  [facility]  [id number] (office)      Subjective:     PT seen on HD    Tolerating procedure well    BP 167/79 , UF goal 3L     Denies cramping, dizziness, cp, sob, pruritis.     Review of Systems:     No cp, sob  No abd pain  + nausea, + diarrhea  On RA    Physical Exam:     Vitals:    11/12/20 0930 11/12/20 0945 11/12/20 1000 11/12/20 1015   BP: 167/79 177/80 157/65 146/73   Pulse: 73 75 73 70   Resp: '20 20 20 20   '$ Temp:       SpO2:           Intake and Output Summary (Last 24 hours) at Date Time  No intake or output data in the 24 hours ending 11/12/20 1044    General: awake, alert, oriented x 3, no acute distress.  Cardiovascular: regular rate and rhythm, no murmurs, rubs or gallops  Lungs: clear to auscultation bilaterally, without wheezing, rhonchi, or rales  Abdomen: soft, non-tender, non-distended, normoactive bowel sounds  Extremities: no clubbing, cyanosis, or edema    Access: LUE AVF     Meds:      Scheduled Meds: PRN Meds:    amLODIPine, 10 mg, Oral, Daily  carvedilol, 25 mg, Oral, Daily  heparin (porcine), 5,000 Units, Subcutaneous, Q12H [place]  insulin lispro, 1-3 Units, Subcutaneous,  QHS  insulin lispro, 1-5 Units, Subcutaneous, TID AC          Continuous Infusions:   acetaminophen, 650 mg, Q6H PRN   Or  acetaminophen, 650 mg, Q6H PRN  albumin human, 100 mL, PRN  glucagon (rDNA), 1 mg, PRN   And  dextrose, 250 mL, PRN   And  dextrose, 25 g, PRN   And  dextrose, 25 g, PRN  hydrALAZINE, 10 mg, Q6H PRN  loperamide, 4 mg, Q4H PRN   Followed by  [Derrill MemoON 11/16/2020] loperamide, 2 mg, Q2H PRN  magnesium sulfate, 1 g, PRN  melatonin, 3 mg, QHS PRN  naloxone, 0.2 mg, PRN  ondansetron, 4 mg, Q6H PRN   Or  ondansetron, 4 mg, Q6H PRN  potassium & sodium phosphates, 2 packet, PRN  potassium chloride, 0-40 mEq, PRN   And  potassium chloride, 10 mEq, PRN  sodium chloride, 100 mL, Q1H PRN  sodium  chloride, 250 mL, PRN              Labs:     Recent Labs   Lab 11/12/20  0226   WBC 4.99   Hgb 10.5*   Hematocrit 34.2*   Platelets 103*     Recent Labs   Lab 11/12/20  0914 11/12/20  0440   Sodium 138 136   Potassium 5.2 6.4*   Chloride 106 103   CO2 18 20   BUN 82.0* 83.0*   Creatinine 10.4* 10.6*   Calcium 8.0* 7.9*   Albumin  --  2.8*   Glucose 290* 339*   EGFR 4.5 4.4             Invalid input(s): LEUKOCYTESUR        Imaging personally reviewed, including: Chest AP Portable    Result Date: 11/12/2020  No acute process. Guy Sandifer, MD  11/12/2020 2:47 AM         Signed by: Gaylyn Lambert, MD

## 2020-11-12 NOTE — H&P (Addendum)
[facility]      Patient: [name]  Date: 11/12/2020   DOB: Mar 13, 1955  Admission Date: 11/12/2020   MRN: [id number]  Attending: Arabella Merles, MD         Chief Complaint   Patient presents with    Flu like symptoms    Missed Dialysis       History Gathered From: Patient    HISTORY AND PHYSICAL     [name] is a 65 y.o. female with a PMHx off ERSD-DD,HTN,DM,OSA on Cpap presented to emergency room for puffiness of the face, cough, fever diarrhea and vomiting, missing dialysis for 1 week.  Patient received flu vaccine 11/03/20 and few days later she developed Fever ,diarrhea, nausea vomiting episode.  Because she did not feel well she did not get dialysis for a week.  She has noticed puffiness in face today, worsening cough and diarrhea for which she brought to the emergency room.  In emergency room revealed potassium of 6.4 troponin of 0.08, unremarkable chest x-ray negative COVID test.  Patient is going to have dialysis and then admitted to [facility]            Past Medical History:   Diagnosis Date    Chronic obstructive pulmonary disease     Diabetes mellitus     Hyperlipidemia     Hypertension     Sleep apnea 2018       Past Surgical History:   Procedure Laterality Date    EGD, BIOPSY N/A 05/14/2019    Procedure: EGD, BIOPSY;  Surgeon: Ronie Spies, MD;  Location: [facility];  Service: Gastroenterology;  Laterality: N/A;    EXPLORATORY LAPAROTOMY N/A 05/27/2019    Procedure: EXPLORATORY LAPAROTOMY;  Surgeon: Herbert Moors., MD;  Location: [facility];  Service: General;  Laterality: N/A;    JOINT REPLACEMENT      shoulder    JOINT REPLACEMENT      knee    OVARY SURGERY Left     Removal    REMOVAL, FOREIGN BODY N/A 05/27/2019    Procedure: REMOVAL, PERITONEAL DIALYSIS CATHETER;  Surgeon: Herbert Moors., MD;  [facility]: [facility];  Service: General;  Laterality: N/A;    TUNNELED CATH CHECK/CHANGE ([facility]MCATH) N/A 07/03/2019    Procedure: TUNNELED CATH CHECK/CHANGE;   Surgeon: Maureen Ralphs, MD;  [facility]: [facility];  Service: Interventional Radiology;  Laterality: N/A;    TUNNELED CATH PLACEMENT ([facility]MCATH) Bilateral 05/23/2019    Procedure: TUNNELED CATH PLACEMENT;  Surgeon: Jacques Earthly, MD;  [facility];  Service: Interventional Radiology;  Laterality: Bilateral;    TUNNELED CATH PLACEMENT ([facility]MCATH) Right 07/31/2019    Procedure: TUNNELED CATH PLACEMENT;  Surgeon: Jacques Earthly, MD;  [facility];  Service: Interventional Radiology;  Laterality: Right;       Prior to Admission medications    Medication Sig Start Date End Date Taking? Authorizing Provider   amLODIPine (NORVASC) 10 MG tablet Take 1 tablet (10 mg total) by mouth daily 01/07/20   Winfred, Carolan Clines, MD   aspirin 81 MG chewable tablet Chew 81 mg by mouth daily    [provider]   atorvastatin (LIPITOR) 40 MG tablet Take 40 mg by mouth daily    [provider]   carvedilol (COREG) 25 MG tablet Take 1 tablet by mouth daily 07/22/20   [provider]   gabapentin (NEURONTIN) 600 MG tablet Take 1 tablet by mouth daily 10/21/19   [provider]   insulin aspart (NovoLOG) 100 UNIT/ML injection Inject 15 Units into the skin 3 (three) times daily before meals    [provider]   sevelamer (RENVELA) 800 MG tablet Take 800 mg by mouth 3 (three) times daily with meals    [provider]   Tyler Aas FlexTouch 200 UNIT/ML Solution Pen-injector Inject 35 Units into the skin nightly 04/01/19   [provider]   vitamin C (ASCORBIC ACID) 500 MG tablet Take 1 tablet (500 mg total) by mouth daily 06/30/19   Dion Body, MD       Allergies   Allergen Reactions    Lisinopril Hives    Metformin Hives       CODE STATUS: Full code    PRIMARY CARE MD: Pcp, None, MD    Family History   Problem Relation Age of Onset    Diabetes Mother     Hypertension Mother     Diabetes Father     Hypertension Father     Diabetes Sister     Hypertension Sister      Diabetes Brother     Hypertension Brother     Diabetes Paternal Aunt     Diabetes Paternal Uncle        Social History     Tobacco Use    Smoking status: Never    Smokeless tobacco: Never   Vaping Use    Vaping Use: Never used   Substance Use Topics    Alcohol use: Never    Drug use: Never       REVIEW OF SYSTEMS   Positive for: Puffiness face cough, fever ,diarrhea    All ROS completed and otherwise negative.    PHYSICAL EXAM     Vital Signs (most recent): BP 176/84    Pulse 72    Temp 98 F (36.7 C)    Resp 22    LMP  (LMP Unknown) Comment: post menopausal   SpO2 98%   Constitutional: Well-nourished.  No acute distress  HEENT: NC/AT, PERRL, no scleral icterus or conjunctival pallor, no nasal discharge, MMM, oropharynx without erythema or exudate  Neck: trachea midline, supple, no cervical or supraclavicular lymphadenopathy or masses  Cardiovascular: RRR, normal S1 S2, no murmurs, gallops, palpable thrills, no JVD, Non-displaced PMI.  Respiratory: Mild basilar crackle ,no wheezing  Gastrointestinal: +BS, non-distended, soft, non-tender, no rebound or guarding, no hepatosplenomegaly  Genitourinary: no suprapubic or costovertebral angle tenderness  Musculoskeletal: ROM and motor strength grossly normal. No clubbing, edema, or cyanosis. DP and radial pulses 2+ and symmetric.  Skin exam: No rashes  Neurologic: EOMI, CN 2-12 grossly intact. no gross motor or sensory deficits  Psychiatric: AAOx3, affect and mood appropriate. The patient is alert, interactive, appropriate.      Exam done by Arabella Merles, MD on 11/12/20 at 9:27 AM      LABS & IMAGING     Recent Results (from the past 24 hour(s))   CBC and differential    Collection Time: 11/12/20  2:26 AM   Result Value Ref Range    WBC 4.99 3.10 - 9.50 x10 3/uL    Hgb 10.5 (L) 11.4 - 14.8 g/dL    Hematocrit 34.2 (L) 34.7 - 43.7 %    Platelets 103 (L) 142 - 346 x10 3/uL    RBC 3.51 (L) 3.90 - 5.10 x10 6/uL    MCV 97.4 (H) 78.0 - 96.0 fL    MCH 29.9 25.1 - 33.5  pg  MCHC 30.7 (L) 31.5 - 35.8 g/dL    RDW 13 11 - 15 %    MPV 12.8 (H) 8.9 - 12.5 fL    Neutrophils 71.6 None %    Lymphocytes Automated 16.2 None %    Monocytes 9.4 None %    Eosinophils Automated 1.6 None %    Basophils Automated 0.6 None %    Immature Granulocytes 0.6 None %    Nucleated RBC 0.0 0.0 - 0.0 /100 WBC    Neutrophils Absolute 3.57 1.10 - 6.33 x10 3/uL    Lymphocytes Absolute Automated 0.81 0.42 - 3.22 x10 3/uL    Monocytes Absolute Automated 0.47 0.21 - 0.85 x10 3/uL    Eosinophils Absolute Automated 0.08 0.00 - 0.44 x10 3/uL    Basophils Absolute Automated 0.03 0.00 - 0.08 x10 3/uL    Immature Granulocytes Absolute 0.03 0.00 - 0.07 x10 3/uL    Absolute NRBC 0.00 0.00 - 0.00 x10 3/uL   Troponin I    Collection Time: 11/12/20  4:40 AM   Result Value Ref Range    Troponin I 0.08 (H) 0.00 - 0.05 ng/mL   Comprehensive metabolic panel    Collection Time: 11/12/20  4:40 AM   Result Value Ref Range    Glucose 339 (H) 70 - 100 mg/dL    BUN 83.0 (H) 7.0 - 21.0 mg/dL    Creatinine 10.6 (H) 0.4 - 1.0 mg/dL    Sodium 136 135 - 145 mEq/L    Potassium 6.4 (HH) 3.5 - 5.3 mEq/L    Chloride 103 99 - 111 mEq/L    CO2 20 17 - 29 mEq/L    Calcium 7.9 (L) 8.5 - 10.5 mg/dL    Protein, Total 6.0 6.0 - 8.3 g/dL    Albumin 2.8 (L) 3.5 - 5.0 g/dL    AST (SGOT) 13 5 - 41 U/L    ALT 45 0 - 55 U/L    Alkaline Phosphatase 229 (H) 37 - 117 U/L    Bilirubin, Total 0.4 0.2 - 1.2 mg/dL    Globulin 3.2 2.0 - 3.6 g/dL    Albumin/Globulin Ratio 0.9 0.9 - 2.2    Anion Gap 13.0 5.0 - 15.0   GFR    Collection Time: 11/12/20  4:40 AM   Result Value Ref Range    EGFR 4.4    COVID-19 (SARS-CoV-2) and Influenza A/B, NAA ([facility])- Admission    Collection Time: 11/12/20  5:29 AM    Specimen: Nasopharyngeal; Culturette   Result Value Ref Range    Purpose of COVID testing Diagnostic -PUI     SARS-CoV-2 Specimen Source Nasal Swab     SARS CoV 2 Overall Result Not Detected     Influenza A Not Detected     Influenza B Not Detected    Glucose  Whole Blood - POCT    Collection Time: 11/12/20  5:52 AM   Result Value Ref Range    Whole Blood Glucose POCT 271 (H) 70 - 100 mg/dL   Glucose Whole Blood - POCT    Collection Time: 11/12/20  6:56 AM   Result Value Ref Range    Whole Blood Glucose POCT 278 (H) 70 - 100 mg/dL   Glucose Whole Blood - POCT    Collection Time: 11/12/20  7:39 AM   Result Value Ref Range    Whole Blood Glucose POCT 261 (H) 70 - 100 mg/dL         Markers:  Recent Labs  Lab 11/12/20  0440   Troponin I 0.08*       EMERGENCY DEPARTMENT COURSE:  Orders Placed This Encounter   Procedures    COVID-19 (SARS-CoV-2) and Influenza A/B, NAA ([facility])- Admission    Chest AP Portable    CBC and differential    GFR    Troponin I    Comprehensive metabolic panel    GFR    Basic Metabolic Panel    GFR    Vital Signs    Eye protection shoud be worn for all instances of  patient contact    Lab draws/medication administration should be bundled to minimize exposure risks    NSG Communication: Glucose POCT order    glucose POC every 1 hours x 4 hours    Telemetry 24 Hour Protocol    Notify    Hemodialysis Interventions    Catheter care (fistula and graft)    Contact isolation    Glucose Whole Blood - POCT    Glucose Whole Blood - POCT    Glucose Whole Blood - POCT    ECG 12 Lead    Saline lock IV    Hemodialysis inpatient    Admit to Inpatient       ASSESSMENT & PLAN         Assessment: /Plan:     Hyperkalemia  -Due to missing dialysis  -Patient will get dialysis , following neurology  -Continue monitoring on telemetry  -Monitor potassium level    Elevated troponin level  -Likely demand ischemia  - monitor troponin level  -Follow-up cardiology  -Continue aspirin, Lipitor, Coreg    HTN  -Continue amlodipine Coreg  -monitor blood pressure and heart rate  -IV hydralazine with parameters    DM  -Continue insulin Lantus and sliding scale  -Continue insulin with meals  -  Hemoglobin A1c 11.8 on 10/21/20 noted     Anemia of chronic disease  -Monitor CBC    Flu  symptoms including cough diarrhea  -Symptoms control  -Currently on room air  -Given Imodium    OSA  -CPAP overnight        Anticipated medical stability for discharge: Per medical improvement    Service status/Reason for ongoing hospitalization: Hyperkalemia elevated troponin level      Signed,  Arabella Merles, MD    11/12/2020 9:27 AM  Time Elapsed: 65 minutes

## 2020-11-12 NOTE — ED Notes (Signed)
Pt prefers to remain on bedside commode.

## 2020-11-12 NOTE — ED Notes (Signed)
Pt's room assigned, pt to be transported to unit prior to dialysis. Call to dialysis to inform of same.

## 2020-11-12 NOTE — ED Provider Notes (Signed)
History     Chief Complaint   Patient presents with    Flu like symptoms    Missed Dialysis      65 year old female with past medical history of end-stage renal disease (on dialysis Monday/Wednesday/Friday), hypertension, hyperlipidemia, type 2 diabetes mellitus who presents with chief complaint of cough.  Patient reports 1 week of dry cough, generalized malaise, myalgias, nausea, vomiting, and diarrhea.  She reports that because of this, she missed the past 1 week of dialysis.  The patient also reports 1 day of diffuse, burning chest pain.  Denies aggravating or alleviating factors, or radiation of her pain.  She has not taken any medications prior to arrival for her symptoms.  Denies fevers, shortness of breath, abdominal pain.       Past Medical History:   Diagnosis Date    Chronic obstructive pulmonary disease     Diabetes mellitus     Hyperlipidemia     Hypertension     Sleep apnea 2018       Past Surgical History:   Procedure Laterality Date    EGD, BIOPSY N/A 05/14/2019    Procedure: EGD, BIOPSY;  Surgeon: Ronie Spies, MD;  Location: [facility];  Service: Gastroenterology;  Laterality: N/A;    EXPLORATORY LAPAROTOMY N/A 05/27/2019    Procedure: EXPLORATORY LAPAROTOMY;  Surgeon: Herbert Moors., MD;  [facility];  Service: General;  Laterality: N/A;    JOINT REPLACEMENT      shoulder    JOINT REPLACEMENT      knee    OVARY SURGERY Left     Removal    REMOVAL, FOREIGN BODY N/A 05/27/2019    Procedure: REMOVAL, PERITONEAL DIALYSIS CATHETER;  Surgeon: Herbert Moors., MD;  [facility];  Service: General;  Laterality: N/A;    TUNNELED CATH CHECK/CHANGE ([facility]ATH) N/A 07/03/2019    Procedure: TUNNELED CATH CHECK/CHANGE;  Surgeon: Maureen Ralphs, MD;  [facility];  Service: Interventional Radiology;  Laterality: N/A;    TUNNELED CATH PLACEMENT ([facility]ATH) Bilateral 05/23/2019    Procedure: TUNNELED CATH PLACEMENT;  Surgeon: Jacques Earthly, MD;  [facility];  Service: Interventional Radiology;  Laterality: Bilateral;    TUNNELED CATH PLACEMENT ([facility]ATH) Right 07/31/2019    Procedure: TUNNELED CATH PLACEMENT;  Surgeon: Jacques Earthly, MD;  [facility];  Service: Interventional Radiology;  Laterality: Right;       Family History   Problem Relation Age of Onset    Diabetes Mother     Hypertension Mother     Diabetes Father     Hypertension Father     Diabetes Sister     Hypertension Sister     Diabetes Brother     Hypertension Brother     Diabetes Paternal Aunt     Diabetes Paternal Uncle        Social  Social History     Tobacco Use    Smoking status: Never    Smokeless tobacco: Never   Vaping Use    Vaping Use: Never used   Substance Use Topics    Alcohol use: Never    Drug use: Never       .     Allergies   Allergen Reactions    Lisinopril Hives    Metformin Hives       Home Medications               amLODIPine (NORVASC) 10 MG tablet  Take 1 tablet (10 mg total) by mouth daily     aspirin 81 MG chewable tablet     Chew 81 mg by mouth daily     atorvastatin (LIPITOR) 40 MG tablet     Take 40 mg by mouth daily     carvedilol (COREG) 25 MG tablet     Take 1 tablet by mouth daily     gabapentin (NEURONTIN) 600 MG tablet     Take 1 tablet by mouth daily     insulin aspart (NovoLOG) 100 UNIT/ML injection     Inject 15 Units into the skin 3 (three) times daily before meals     sevelamer (RENVELA) 800 MG tablet     Take 800 mg by mouth 3 (three) times daily with meals     Tresiba FlexTouch 200 UNIT/ML Solution Pen-injector     Inject 35 Units into the skin nightly     vitamin C (ASCORBIC ACID) 500 MG tablet     Take 1 tablet (500 mg total) by mouth daily             Review of Systems   Constitutional:  Positive for fatigue. Negative for chills and fever.   HENT:  Negative for congestion, rhinorrhea and sore throat.    Respiratory:  Positive for cough. Negative for shortness of breath.    Cardiovascular:  Negative for chest pain and leg swelling.    Gastrointestinal:  Positive for diarrhea, nausea and vomiting. Negative for abdominal pain.   Genitourinary:  Negative for dysuria, frequency and urgency.   Musculoskeletal:  Positive for myalgias. Negative for back pain and neck pain.   Skin:  Negative for rash.   Neurological:  Negative for dizziness, weakness, numbness and headaches.   All other systems reviewed and are negative.    Physical Exam    BP: 194/87, Heart Rate: 84, Temp: 98.1 F (36.7 C), Resp Rate: 19, SpO2: 94 %    Physical Exam  Vitals and nursing note reviewed.   Constitutional:       General: She is not in acute distress.     Appearance: Normal appearance. She is not toxic-appearing.   HENT:      Head: Normocephalic and atraumatic.      Nose: Nose normal.      Mouth/Throat:      Mouth: Mucous membranes are moist.      Pharynx: Oropharynx is clear.   Eyes:      Extraocular Movements: Extraocular movements intact.      Pupils: Pupils are equal, round, and reactive to light.   Cardiovascular:      Rate and Rhythm: Normal rate and regular rhythm.      Pulses: Normal pulses.      Heart sounds: Normal heart sounds.   Pulmonary:      Effort: Pulmonary effort is normal. No respiratory distress.      Breath sounds: No wheezing, rhonchi or rales.      Comments: Mild tachypnea without accessory muscle use.   Abdominal:      General: Abdomen is flat. There is no distension.      Palpations: Abdomen is soft.      Tenderness: There is no abdominal tenderness. There is no guarding.   Musculoskeletal:         General: No swelling or deformity. Normal range of motion.      Cervical back: Normal range of motion and neck supple.      Right lower leg: No  edema.      Left lower leg: No edema.   Skin:     General: Skin is warm and dry.      Capillary Refill: Capillary refill takes less than 2 seconds.   Neurological:      General: No focal deficit present.      Mental Status: She is alert and oriented to person, place, and time.      Motor: No weakness.    Psychiatric:         Mood and Affect: Mood normal.         Behavior: Behavior normal.         MDM and ED Course     ED Medication Orders (From admission, onward)      Start Ordered     Status Ordering Provider    11/12/20 0900 11/12/20 0509  amLODIPine (NORVASC) tablet 10 mg  Daily        Route: Oral  Ordered Dose: 10 mg     Acknowledged Mykenna Viele    11/12/20 0900 11/12/20 0509  carvedilol (COREG) tablet 25 mg  Daily        Route: Oral  Ordered Dose: 25 mg     Acknowledged Rowena Moilanen    11/12/20 0612 11/12/20 0611  labetalol (NORMODYNE,TRANDATE) injection 20 mg  Once        Route: Intravenous  Ordered Dose: 20 mg     Last MAR action: Given Le Ferraz    11/12/20 0540 11/12/20 0539  sodium bicarbonate 8.4 % injection 50 mEq  Once        Route: Intravenous  Ordered Dose: 50 mEq     Last MAR action: Given Ishmael Holter    11/12/20 0540 11/12/20 0539    Once        Route: Intravenous  Ordered Dose: 50 g    See Hyperspace for full Linked Orders Report.    Discontinued Ishmael Holter    11/12/20 0540 11/12/20 0539  insulin regular (HumuLIN R) 5 Units intravenous injection  Once        Route: Intravenous  Ordered Dose: 5 Units    See Hyperspace for full Linked Orders Report.    Last MAR action: Given Ishmael Holter    11/12/20 0540 11/12/20 0539  sodium polystyrene (KAYEXALATE) 15 GM/60ML suspension 30 g  Once        Route: Oral  Ordered Dose: 30 g     Last MAR action: Given Ishmael Holter    11/12/20 0540 11/12/20 0539  calcium GLUConate 10 % injection 1 g  Once        Route: Intravenous  Ordered Dose: 1 g     Last MAR action: Given Ishmael Holter    11/12/20 0509 11/12/20 0509  hydrALAZINE (APRESOLINE) injection 10 mg  Once        Route: Intravenous  Ordered Dose: 10 mg     Last MAR action: Given Gaberial Cada               MDM     Amount and/or Complexity of Data Reviewed  Clinical lab tests: reviewed  Tests in the radiology section of CPT: reviewed  Review and summarize past medical records: yes  Discuss the patient  with other providers: yes  Independent visualization of images, tracings, or specimens: yes    EKG: normal EKG, normal sinus rhythm, unchanged from previous tracings.    I am the first provider for this patient.    I reviewed  the vital signs, available nursing notes, past medical history, past surgical history, family history and social history.    Vital Signs-Reviewed the patient's vital signs.   Patient Vitals for the past 12 hrs:   BP Temp Pulse Resp   11/12/20 0700 198/84 -- 71 18   11/12/20 0630 (!) 222/93 -- 80 (!) 24   11/12/20 0600 (!) 207/82 -- 77 22   11/12/20 0500 (!) 224/102 -- 76 (!) 24   11/12/20 0211 194/87 98.1 F (36.7 C) 84 19       Pulse Oximetry Analysis - Normal 95% on RA    Cardiac Monitor:  Rate: 75bpm  Rhythm:  Normal Sinus Rhythm    Critical Care Time: CRITICAL CARE: The high probability of sudden, clinically significant deterioration in the patient's condition required the highest level of my preparedness to intervene urgently.    The services I provided to this patient were to treat and/or prevent clinically significant deterioration that could result in: cardiopulmonary collapse, morbidity.  Services included the following: chart data review, reviewing nursing notes and/or old charts, documentation time, consultant collaboration regarding findings and treatment options, medication orders and management, direct patient care, re-evaluations, vital sign assessments and ordering, interpreting and reviewing diagnostic studies/lab tests.    Aggregate critical care time was 30 minutes, which includes only time during which I was engaged in work directly related to the patient's care, as described above, whether at the bedside or elsewhere in the Emergency Department.  It did not include time spent performing other reported procedures or the services of residents, students, nurses or physician assistants.    For Hospitalized Patients:    1. Hospitalization Decision Time:  The decision to admit  this patient was made by the emergency provider at 0703[time] on 11/12/2020     2. Aspirin: Aspirin was not given because the patient did not present with a stroke at the time of their Emergency Department evaluation.    3. Core Measures: SEP-1 documentation    Please select option A (exclude), B (severe sepsis), or C (septic shock) from the drop-down menu below:    A. ------------Severe Sepsis/Septic Shock Exclusion----------------------------    Pt is excluded from severe sepsis or septic shock consideration at 0726 [enter time of bed request placement] because of one or more reasons below:    All SIRS, abnl vitals, organ dysfunction are NOT due to severe sepsis or septic shock, but due to alternative cause: Other: CKD, hyperkalemia [list cause].     The timestamp from the preceding paragraph above also applies to every infectious and/or SEP-1-related diagnosis in Clinical Impression or MDM sections of this note.           ED Course as of 11/12/20 0734   Fri Nov 12, 2020   0331 Chest AP Portable  IMPRESSION:      No acute process. [EF]   [id number] Potassium(!!): 6.4  Hyperkalemia protocol ordered.  [EF]   [id number] Troponin I(!): 0.08 [EF]   0962 Nephrology, Dr.Kwon, paged to discuss patient for dialysis.  [EF]   [id number] I spoke with nephrology, Dr.Modlinger, who feels the patient can be dialyzed first thing this morning. He agrees with the treatment ordered at this time. Plan hospitalist admission with nephrology consult.  [EF]   0703 Referred for admission to [facility].  [EF]   [id number] The patient was accepted for admission to [facility] inpatient Dr.Rahmanian. Orders for admission placed.  [EF]      ED Course User Index  [EF]  Ishmael Holter, DO             Procedures    Clinical Impression & Disposition     Clinical Impression  Final diagnoses:   ESRD (end stage renal disease)   Hyperkalemia   Myalgia   Vomiting and diarrhea        ED Disposition       ED Disposition   Admit    Condition   --    Date/Time   Fri Nov 12, 2020   7:25 AM    Comment   Admitting Physician: Lynnea Ferrier, Ginger Blue   Service:: Medicine [106]   Estimated Length of Stay: > or = to 2 midnights   Tentative Discharge Plan?: Home or Self Care [1]   Does patient need telemetry?: Yes   Telemetry type (separate Telemetry order is also required):: Cardiac telemetry                  New Prescriptions    No medications on file                   Ishmael Holter, DO  11/12/20 1580

## 2020-11-12 NOTE — ED Notes (Signed)
3rd  Blood Specimen Gold Top sent at this time, For the last two were Hemolyzed per lAb

## 2020-11-12 NOTE — Progress Notes (Signed)
Completed 3.5 hours of HD. 3.1L UF tolerated well. Report given to Janalee Dane) RN.     11/12/20 1300   Treatment Summary   Time Off Machine 1250   Duration of Treatment (Hours) 3.5   Treatment Type 1:1   Dialyzer Clearance Moderately streaked   Fluid Volume Off (mL) 3500   Prime Volume (mL) 200   Rinseback Volume (mL) 200   Fluid Given: Normal Saline (mL) 0   Fluid Given: PRBC  0 mL   Fluid Given: Albumin (mL) 0   Fluid Given: Other (mL) 0   Total Fluid Given 400   Hemodialysis Net Fluid Removed 3100   Post Treatment Assessment   Post-Treatment Weight (Kg) 77.8   Patient Response to Treatment tolerated well   Vitals   Temp 98 F (36.7 C)   Heart Rate 70   Resp Rate 20   BP 177/79   SpO2 98 %   O2 Device None (Room air)   Assessment   Mental Status Alert;Oriented;Cooperative   Cardiac (WDL) WDL   Cardiac Regularity Regular   Cardiac Rhythm Normal Sinus Rhythm   Respiratory  WDL   Bilateral Breath Sounds Clear   Edema  X   Generalized Edema Non Pitting Edema   Facial Non Pitting Edema   Sacral UTA   RLE Edema Non Pitting Edema   LLE Edema Non Pitting Edema   General Skin Color Appropriate for ethnicity   Skin Condition/Temp Warm;Dry   Gastrointestinal (WDL) WDL   Abdomen Inspection Soft;Nondistended   GI Symptoms None   Mobility Bed   Pain Assessment   Charting Type Assessment   Pain Scale Used Numeric Scale (0-10)   Numeric Pain Scale   Pain Score 0   POSS Score 1   Education   Person taught Patient   Knowledge basis Substantial   Topics taught Fluid Management;Procedure   Teaching Tools Explain   Reponse Verbalizes Understanding   Bedside Nurse Communication   Name of bedside RN - post dialysis Nkenge DejesusZigmund Daniel)

## 2020-11-12 NOTE — ED Triage Notes (Signed)
Pt reports to ED from home with C/C of Flu Like sx's. Per pt onset of sx is X1week, but worsened within that last 3 days. Pt repports missing 3 sessions of Dialysis due to not feeling well goes on MWF. RN noted pt edematous on arrival.

## 2020-11-12 NOTE — ED Notes (Signed)
Again specimen sent is hemolyzed, RN walked 4 gold top to Lab at this time. RN currently awaits for lab results.

## 2020-11-12 NOTE — Progress Notes (Signed)
Received patient from ED for urgent dialysis due to hyperkalemia secondary to 1 week missed treatment. Patient alert, oriented x 3. Denies any pain. Vital signs within normal range. Hypertensive. For HD with high volume UF as tolerated. Will monitor closely on dialysis.     11/12/20 0915   Bedside Nurse Communication   Name of bedside RN - pre dialysis Melissa Solomos   Treatment Initiation- With Dialysis Precautions   Time Out/Safety Check Completed Yes   Consent for HD signed for this hospitalization (Date) 10/20/20   Consent for HD signed for this hospitalization (Time) 1545   Preferred language Yes   Complication waiver N   Blood Consent Verified N/A   Dialysis Precautions All Connections Secured;Saline Line Double Clamped;Venous Parameters Set;Arterial Parameters Set;Air Foam Detecctor Engaged   Dialysis Treatment Type Acute Room;Urgent   Special Considerations hyperkalemia   Is patient diabetic? Yes   RO/Hemodialysis Architectural technologist   Is Total Chlorine less than 0.1 ppm? Yes   Orignial Total Chlorine Testing Time 0900   RO/Hemodialysis Facilities manager Number 4    Machine Serial Number [id number]   RO # 27   RO Serial # [id number]   Water Hardness NA   pH 7.2   Pressure Test Verified Yes   Alarms Verified Passed   Machine Temperature 97.7 F (36.5 C)   Alarms Verified Yes   Na+ mEq (Machine) 138 mEq   Bicarb mEq (Machine) 35 mEq   Hemodialysis Conductivity (Machine) 13.8   Hemodialysis Conductivity (Meter) 13.8   Dialyzer Lot Number [id number]   Tubing Lot Number [id number]   RO Machine Log Completed Yes   Hepatitis Status   HBsAg (Antigen) Result Negative   HBsAg Date Drawn 10/20/20   HBsAg Repeat Draw Due Date 11/16/20   Dialysis Weight   Pre-Treatment Weight (Kg) 80.9   Scale Type ICU Bed Scale   Vitals   Temp 98 F (36.7 C)   Heart Rate 78   Resp Rate 20   BP 170/79   SpO2 98 %   O2 Device None (Room air)   Assessment   Mental Status Alert;Oriented;Cooperative   Cardiac (WDL) WDL   Cardiac  Regularity Regular   Cardiac Rhythm Normal Sinus Rhythm   Respiratory  WDL   Bilateral Breath Sounds Clear;Diminished   Edema  X   Generalized Edema Non Pitting Edema   Facial Non Pitting Edema   Sacral UTA   RLE Edema Non Pitting Edema   LLE Edema Non Pitting Edema   General Skin Color Appropriate for ethnicity   Skin Condition/Temp Warm;Dry   Gastrointestinal (WDL) WDL   Abdomen Inspection Soft;Nondistended   GI Symptoms None   Mobility Bed   Pain Assessment   Charting Type Assessment   Pain Scale Used Numeric Scale (0-10)   Numeric Pain Scale   Pain Score 0   POSS Score 1   Hemodialysis Comments   Pre-Hemodialysis Comments time out done   Hemodialysis History Information   Outpatient Dialysis Unit [facility]   Chronic Dialysis Patient Yes   Outpatient Nephrologist Dr Drema Pry

## 2020-11-13 DIAGNOSIS — E875 Hyperkalemia: Secondary | ICD-10-CM | POA: Diagnosis present

## 2020-11-13 LAB — CBC AND DIFFERENTIAL
Absolute NRBC: 0 10*3/uL (ref 0.00–0.00)
Basophils Absolute Automated: 0.02 10*3/uL (ref 0.00–0.08)
Basophils Automated: 0.5 %
Eosinophils Absolute Automated: 0.1 10*3/uL (ref 0.00–0.44)
Eosinophils Automated: 2.4 %
Hematocrit: 30.5 % — ABNORMAL LOW (ref 34.7–43.7)
Hgb: 9.6 g/dL — ABNORMAL LOW (ref 11.4–14.8)
Immature Granulocytes Absolute: 0.02 10*3/uL (ref 0.00–0.07)
Immature Granulocytes: 0.5 %
Lymphocytes Absolute Automated: 0.91 10*3/uL (ref 0.42–3.22)
Lymphocytes Automated: 21.7 %
MCH: 29.5 pg (ref 25.1–33.5)
MCHC: 31.5 g/dL (ref 31.5–35.8)
MCV: 93.8 fL (ref 78.0–96.0)
MPV: 12 fL (ref 8.9–12.5)
Monocytes Absolute Automated: 0.46 10*3/uL (ref 0.21–0.85)
Monocytes: 11 %
Neutrophils Absolute: 2.68 10*3/uL (ref 1.10–6.33)
Neutrophils: 63.9 %
Nucleated RBC: 0 /100 WBC (ref 0.0–0.0)
Platelets: 122 10*3/uL — ABNORMAL LOW (ref 142–346)
RBC: 3.25 10*6/uL — ABNORMAL LOW (ref 3.90–5.10)
RDW: 13 % (ref 11–15)
WBC: 4.19 10*3/uL (ref 3.10–9.50)

## 2020-11-13 LAB — COMPREHENSIVE METABOLIC PANEL
ALT: 34 U/L (ref 0–55)
AST (SGOT): 11 U/L (ref 5–41)
Albumin/Globulin Ratio: 0.9 (ref 0.9–2.2)
Albumin: 2.5 g/dL — ABNORMAL LOW (ref 3.5–5.0)
Alkaline Phosphatase: 198 U/L — ABNORMAL HIGH (ref 37–117)
Anion Gap: 12 (ref 5.0–15.0)
BUN: 41 mg/dL — ABNORMAL HIGH (ref 7.0–21.0)
Bilirubin, Total: 0.4 mg/dL (ref 0.2–1.2)
CO2: 24 mEq/L (ref 17–29)
Calcium: 7.9 mg/dL — ABNORMAL LOW (ref 8.5–10.5)
Chloride: 102 mEq/L (ref 99–111)
Creatinine: 6.3 mg/dL — ABNORMAL HIGH (ref 0.4–1.0)
Globulin: 2.8 g/dL (ref 2.0–3.6)
Glucose: 307 mg/dL — ABNORMAL HIGH (ref 70–100)
Potassium: 4.4 mEq/L (ref 3.5–5.3)
Protein, Total: 5.3 g/dL — ABNORMAL LOW (ref 6.0–8.3)
Sodium: 138 mEq/L (ref 135–145)

## 2020-11-13 LAB — GFR: EGFR: 8

## 2020-11-13 LAB — GLUCOSE WHOLE BLOOD - POCT
Whole Blood Glucose POCT: 249 mg/dL — ABNORMAL HIGH (ref 70–100)
Whole Blood Glucose POCT: 216 mg/dL — ABNORMAL HIGH (ref 70–100)

## 2020-11-13 MED ORDER — CARVEDILOL 25 MG PO TABS
25.0000 mg | ORAL_TABLET | Freq: Two times a day (BID) | ORAL | Status: DC
Start: 2020-11-13 — End: 2020-11-13

## 2020-11-13 NOTE — Progress Notes (Signed)
[facility] PROGRESS NOTE  [id number]      Date Time: 11/13/20 7:24 AM  Patient Name: [name]  Attending Physician: Colin Broach, MD    CC: follow-up     Assessment:        1. ESRD on HD MWF at [facility] under Dr Corky Mull.   -missed 1 week of HD  2. Hyperkalemia - K 6.4. Improved s/p dialysis  3. Metabolic acidosis due to ESRD-improved s/p dialysis  4. Anemia of CKD  5. HTN emergency 224/102-BP improved  6. Volume Overload  7. Diarrhea and Vomiting.     Recommendations:     1) HD again today  2) BP should further improve with UF  3) Monitor K  4) Epo when BP better controlled  5) HD MWF per routine      Case discussed with: patient      Eilene Ghazi, MD  [facility]  [id number] (office)      Subjective:   Feels better. Had HD with 3 L UF yesterday     Review of Systems:   No current CP or SOB      Physical Exam:     Vitals:    11/12/20 1600 11/12/20 1929 11/12/20 2318 11/13/20 0314   BP: 162/71 128/75 166/73 138/72   Pulse: 80 78 82 75   Resp: 18 17 18 18    Temp: 97.5 F (36.4 C) 98.1 F (36.7 C) 98.1 F (36.7 C) 97.9 F (36.6 C)   TempSrc: Oral Oral Oral Oral   SpO2: 96% 95% 97% 97%   Weight:    100.5 kg (221 lb 9 oz)   Height:           Intake and Output Summary (Last 24 hours) at Date Time    Intake/Output Summary (Last 24 hours) at 11/13/2020 0724  Last data filed at 11/13/2020 0314  Gross per 24 hour   Intake 240 ml   Output 3300 ml   Net -3060 ml       General: awake, alert, oriented x 3, no acute distress.  Cardiovascular: regular rate and rhythm, no murmurs, rubs or gallops  Lungs: diminished BS bases , without wheezing, rhonchi, or rales  Abdomen: soft, non-tender, non-distended, normoactive bowel sounds  Extremities: 1+ LE edema, LUE AVF +thrill/bruit    Meds:      Scheduled Meds: PRN Meds:    amLODIPine, 10 mg, Oral, Daily  carvedilol, 25 mg, Oral, Daily  heparin (porcine), 5,000 Units, Subcutaneous, Q12H SCH  insulin lispro, 1-3 Units,  Subcutaneous, QHS  insulin lispro, 1-5 Units, Subcutaneous, TID AC          Continuous Infusions:   acetaminophen, 650 mg, Q6H PRN   Or  acetaminophen, 650 mg, Q6H PRN  glucagon (rDNA), 1 mg, PRN   And  dextrose, 250 mL, PRN   And  dextrose, 25 g, PRN   And  dextrose, 25 g, PRN  hydrALAZINE, 10 mg, Q6H PRN  loperamide, 4 mg, Q4H PRN   Followed by  Derrill Memo ON 11/16/2020] loperamide, 2 mg, Q2H PRN  magnesium sulfate, 1 g, PRN  melatonin, 3 mg, QHS PRN  naloxone, 0.2 mg, PRN  ondansetron, 4 mg, Q6H PRN   Or  ondansetron, 4 mg, Q6H PRN  potassium & sodium phosphates, 2 packet, PRN  potassium chloride, 0-40 mEq, PRN   And  potassium chloride, 10 mEq, PRN  Labs:     Recent Labs   Lab 11/13/20  0303 11/12/20  0226   WBC 4.19 4.99   Hgb 9.6* 10.5*   Hematocrit 30.5* 34.2*   Platelets 122* 103*     Recent Labs   Lab 11/13/20  0303 11/12/20  0914 11/12/20  0440   Sodium 138 138 136   Potassium 4.4 5.2 6.4*   Chloride 102 106 103   CO2 24 18 20    BUN 41.0* 82.0* 83.0*   Creatinine 6.3* 10.4* 10.6*   Calcium 7.9* 8.0* 7.9*   Albumin 2.5*  --  2.8*   Glucose 307* 290* 339*   EGFR 8.0 4.5 4.4             Invalid input(s): LEUKOCYTESUR        Imaging personally reviewed, including: No results found.        Signed by: Eilene Ghazi, MD

## 2020-11-13 NOTE — Plan of Care (Signed)
Problem: Renal Instability  Goal: Fluid and electrolyte balance are achieved/maintained  Outcome: Progressing  Flowsheets (Taken 11/13/2020 1016)  Fluid and electrolyte balance are achieved/maintained:   Assess and reassess fluid and electrolyte status   Monitor/assess lab values and report abnormal values  Goal: Free from infection  Outcome: Progressing  Flowsheets (Taken 11/13/2020 1016)  Free from infection:   Assess need for indwelling catheter every shift and discuss with LIP   Monitor/assess for signs and symptoms of infection     Problem: Patient Receiving Advanced Renal Therapies  Goal: Therapy access site remains intact  Outcome: Progressing  Flowsheets (Taken 11/13/2020 1016)  Therapy access site remains intact:   Assess therapy access site   Change therapy access site dressing as needed

## 2020-11-13 NOTE — Progress Notes (Signed)
Completed 3.5 hours of HD. 4.1L UF tolerated well. Report given to Phoung-Thao RN.     11/13/20 1315   Treatment Summary   Time Off Machine 1310   Duration of Treatment (Hours) 3.5   Treatment Type 2:1   Dialyzer Clearance Moderately streaked   Fluid Volume Off (mL) 4500   Prime Volume (mL) 200   Rinseback Volume (mL) 200   Fluid Given: Normal Saline (mL) 0   Fluid Given: PRBC  0 mL   Fluid Given: Albumin (mL) 0   Fluid Given: Other (mL) 0   Total Fluid Given 400   Hemodialysis Net Fluid Removed 4100   Post Treatment Assessment   Post-Treatment Weight (Kg) 96.4   Patient Response to Treatment tolerated well   Vitals   Temp 98.2 F (36.8 C)   Heart Rate 75   Resp Rate 20   BP 152/70   SpO2 98 %   O2 Device None (Room air)   Assessment   Mental Status Alert;Oriented;Cooperative   Cardiac (WDL) WDL   Cardiac Regularity Regular   Cardiac Rhythm Normal Sinus Rhythm   Respiratory  WDL   Respiratory Pattern Regular   Bilateral Breath Sounds Clear   Generalized Edema Non Pitting Edema   Facial Non Pitting Edema   Sacral UTA   RLE Edema Non Pitting Edema   LLE Edema Non Pitting Edema   General Skin Color Appropriate for ethnicity   Skin Condition/Temp Warm;Dry   Gastrointestinal (WDL) WDL   Abdomen Inspection Soft;Nondistended   GI Symptoms None   Mobility Ambulatory   Pain Assessment   Charting Type Assessment   Pain Scale Used Numeric Scale (0-10)   Numeric Pain Scale   Pain Score 0   POSS Score 1   Education   Person taught Patient   Knowledge basis Substantial   Topics taught Fluid Management;Procedure;K+   Teaching Tools Explain   [name] Understanding   Bedside Nurse Communication   Name of bedside RN - post dialysis Phoung-Thao Alfonse Spruce

## 2020-11-13 NOTE — Plan of Care (Signed)
Pt was alert and oriented x4. VSS. No complain of pain or discomfort. Blood sugar monitored and insulin was given per sliding scale. Dialysis was done today per order. Safety measures were in place. Purposeful rounding done.    Pt discharged from room 2117. The IV and Tele were removed. Pt was being transported by the family.  All pt belongings were with pt and family.  Pt and family was educated on discharge instruction.   All Rx sent to pharmacy and confirmed by family. All questions by family were answered.     Problem: Renal Instability  Goal: Fluid and electrolyte balance are achieved/maintained  Outcome: Completed  Goal: Free from infection  Outcome: Completed     Problem: Patient Receiving Advanced Renal Therapies  Goal: Therapy access site remains intact  Outcome: Completed     Problem: Safety  Goal: Patient will be free from injury during hospitalization  Outcome: Completed  Goal: Patient will be free from infection during hospitalization  Outcome: Completed     Problem: Pain  Goal: Pain at adequate level as identified by patient  Outcome: Completed     Problem: Side Effects from Pain Analgesia  Goal: Patient will experience minimal side effects of analgesic therapy  Outcome: Completed     Problem: Discharge Barriers  Goal: Patient will be discharged home or other facility with appropriate resources  Outcome: Completed     Problem: Psychosocial and Spiritual Needs  Goal: Demonstrates ability to cope with hospitalization/illness  Outcome: Completed     Problem: Moderate/High Fall Risk Score >5  Goal: Patient will remain free of falls  Outcome: Completed

## 2020-11-13 NOTE — Final Progress Note (DC Note for stay less than 48 (Signed)
Date:11/13/2020   Patient Name: [name]  Attending Physician: Colin Broach, MD  Today:   BP 159/76    Pulse 78    Temp 98.2 F (36.8 C)    Resp 20    Ht 1.575 m (5' 2.01")    Wt 100.5 kg (221 lb 9 oz)    LMP  (LMP Unknown) Comment: post menopausal   SpO2 98%    BMI 40.51 kg/m   Ranges for the last 24 hours:  Temp:  [97.5 F (36.4 C)-98.2 F (36.8 C)] 98.2 F (36.8 C)  Heart Rate:  [68-82] 78  Resp Rate:  [15-22] 20  BP: (128-200)/(66-87) 159/76    Date of Admission:   11/12/2020    Date of Discharge:   11/13/2020    Outcome of Hospitalization:   [name] is a 65 y.o. female with a PMHx of ERSD on HD, HTN, type 2 DM, OSA on CPAP who presented to the ED for face swelling and flu like symptoms, admitted for hypertensive urgency and hyperkalemia.      Patient was started on home anti-hypertensives and hyperkalemia was treated with hyperkalemia cocktail in the ED. Nephrology was consulted. Patient taken for dialysis on 10/21 and 10/22 with normalization of hyperkalemia and improvement in BP. She was cleared for discharge from Nephrology stand point with outpatient follow-up.    Patient also endorsed chest pain while in the ED. EKG was non-ischemic and patient's troponin peaked at 0.08. Cardiology was consulted and recommended no further inpatient work-up. Patient will follow-up with her Cardiologist as outpatient.    Patient medically stable on day of discharged.   Lab Results last 48 Hours       Procedure Component Value Units Date/Time    Glucose Whole Blood - POCT [[id number]]  (Abnormal) Collected: 11/13/20 0734     Updated: 11/13/20 0745     Whole Blood Glucose POCT 249 mg/dL     Comprehensive metabolic panel [[id number]]  (Abnormal) Collected: 11/13/20 0303    Specimen: Blood Updated: 11/13/20 0354     Glucose 307 mg/dL      BUN 41.0 mg/dL      Creatinine 6.3 mg/dL      Sodium 138 mEq/L      Potassium 4.4 mEq/L      Chloride 102 mEq/L      CO2 24 mEq/L      Calcium 7.9 mg/dL       Protein, Total 5.3 g/dL      Albumin 2.5 g/dL      AST (SGOT) 11 U/L      ALT 34 U/L      Alkaline Phosphatase 198 U/L      Bilirubin, Total 0.4 mg/dL      Globulin 2.8 g/dL      Albumin/Globulin Ratio 0.9     Anion Gap 12.0    GFR [[id number]] Collected: 11/13/20 0303     Updated: 11/13/20 0354     EGFR 8.0    CBC and differential [[id number]]  (Abnormal) Collected: 11/13/20 0303    Specimen: Blood Updated: 11/13/20 0339     WBC 4.19 x10 3/uL      Hgb 9.6 g/dL      Hematocrit 30.5 %      Platelets 122 x10 3/uL      RBC 3.25 x10 6/uL      MCV 93.8 fL      MCH 29.5 pg      MCHC 31.5 g/dL  RDW 13 %      MPV 12.0 fL      Neutrophils 63.9 %      Lymphocytes Automated 21.7 %      Monocytes 11.0 %      Eosinophils Automated 2.4 %      Basophils Automated 0.5 %      Immature Granulocytes 0.5 %      Nucleated RBC 0.0 /100 WBC      Neutrophils Absolute 2.68 x10 3/uL      Lymphocytes Absolute Automated 0.91 x10 3/uL      Monocytes Absolute Automated 0.46 x10 3/uL      Eosinophils Absolute Automated 0.10 x10 3/uL      Basophils Absolute Automated 0.02 x10 3/uL      Immature Granulocytes Absolute 0.02 x10 3/uL      Absolute NRBC 0.00 x10 3/uL     Glucose Whole Blood - POCT [[id number]]  (Abnormal) Collected: 11/12/20 2316     Updated: 11/12/20 2321     Whole Blood Glucose POCT 302 mg/dL     Glucose Whole Blood - POCT [[id number]]  (Abnormal) Collected: 11/12/20 2109     Updated: 11/12/20 2111     Whole Blood Glucose POCT 310 mg/dL     Glucose Whole Blood - POCT [[id number]]  (Abnormal) Collected: 11/12/20 1556     Updated: 11/12/20 1603     Whole Blood Glucose POCT 330 mg/dL     Glucose Whole Blood - POCT [[id number]]  (Abnormal) Collected: 11/12/20 1347     Updated: 11/12/20 1406     Whole Blood Glucose POCT 225 mg/dL     Basic Metabolic Panel [[id number]]  (Abnormal) Collected: 11/12/20 0914    Specimen: Blood Updated: 11/12/20 1004     Glucose 290 mg/dL      BUN 82.0 mg/dL      Creatinine 10.4 mg/dL      Calcium 8.0 mg/dL       Sodium 138 mEq/L      Potassium 5.2 mEq/L      Chloride 106 mEq/L      CO2 18 mEq/L      Anion Gap 14.0    GFR [[id number]] Collected: 11/12/20 0914     Updated: 11/12/20 1004     EGFR 4.5    Glucose Whole Blood - POCT [[id number]]  (Abnormal) Collected: 11/12/20 0739     Updated: 11/12/20 0742     Whole Blood Glucose POCT 261 mg/dL     Glucose Whole Blood - POCT [[id number]]  (Abnormal) Collected: 11/12/20 0656     Updated: 11/12/20 0658     Whole Blood Glucose POCT 278 mg/dL     COVID-19 (SARS-CoV-2) and Influenza A/B, NAA ([facility] Rapid)- Admission [[id number]] Collected: 11/12/20 0529    Specimen: Culturette from Nasopharyngeal Updated: 11/12/20 0558     Purpose of COVID testing Diagnostic -[facility]     SARS-CoV-2 Specimen Source Nasal Swab     SARS CoV 2 Overall Result Not Detected     Influenza A Not Detected     Influenza B Not Detected    Narrative:      o Collect and clearly label specimen type:  o PREFERRED-Upper respiratory specimen: One Nasal Swab in  Transport Media.  o Hand deliver to laboratory ASAP  Diagnostic -[facility]    Glucose Whole Blood - POCT [[id number]]  (Abnormal) Collected: 11/12/20 0552     Updated: 11/12/20 0555     Whole Blood Glucose POCT 271 mg/dL     Comprehensive  metabolic panel [[id number]]  (Abnormal) Collected: 11/12/20 0440    Specimen: Blood Updated: 11/12/20 0534     Glucose 339 mg/dL      BUN 83.0 mg/dL      Creatinine 10.6 mg/dL      Sodium 136 mEq/L      Potassium 6.4 mEq/L      Chloride 103 mEq/L      CO2 20 mEq/L      Calcium 7.9 mg/dL      Protein, Total 6.0 g/dL      Albumin 2.8 g/dL      AST (SGOT) 13 U/L      ALT 45 U/L      Alkaline Phosphatase 229 U/L      Bilirubin, Total 0.4 mg/dL      Globulin 3.2 g/dL      Albumin/Globulin Ratio 0.9     Anion Gap 13.0    GFR [[id number]] Collected: 11/12/20 0440     Updated: 11/12/20 0534     EGFR 4.4    Troponin I [[id number]]  (Abnormal) Collected: 11/12/20 0440    Specimen: Blood Updated: 11/12/20 0526     Troponin I 0.08 ng/mL     CBC and  differential [[id number]]  (Abnormal) Collected: 11/12/20 0226    Specimen: Blood Updated: 11/12/20 0301     WBC 4.99 x10 3/uL      Hgb 10.5 g/dL      Hematocrit 34.2 %      Platelets 103 x10 3/uL      RBC 3.51 x10 6/uL      MCV 97.4 fL      MCH 29.9 pg      MCHC 30.7 g/dL      RDW 13 %      MPV 12.8 fL      Neutrophils 71.6 %      Lymphocytes Automated 16.2 %      Monocytes 9.4 %      Eosinophils Automated 1.6 %      Basophils Automated 0.6 %      Immature Granulocytes 0.6 %      Nucleated RBC 0.0 /100 WBC      Neutrophils Absolute 3.57 x10 3/uL      Lymphocytes Absolute Automated 0.81 x10 3/uL      Monocytes Absolute Automated 0.47 x10 3/uL      Eosinophils Absolute Automated 0.08 x10 3/uL      Basophils Absolute Automated 0.03 x10 3/uL      Immature Granulocytes Absolute 0.03 x10 3/uL      Absolute NRBC 0.00 x10 3/uL             Procedures performed:   Radiology: all results from this admission  XR Hip left 1 vw with pelvis    Result Date: 10/20/2020  No radiographically apparent displaced fracture is present. Please note that decreased bony mineralization can limit evaluation for nondisplaced fractures. Vernie Shanks, MD  10/20/2020 1:27 PM    XR Chest PA Or AP    Result Date: 10/20/2020  No significant interval change of diffuse interstitial markings, suggestive of pulmonary vascular congestion. Elnita Maxwell, MD  10/20/2020 1:19 PM    Forearm Complete Left    Result Date: 10/20/2020  1. No acute fracture. 2. Diffuse soft tissue swelling, nonspecific. 3. Diffuse osteopenia. 4. Peripheral vascular disease. Lonia Skinner, MD  10/20/2020 1:11 PM    Wrist Left PA Lateral and Oblique    Result Date: 10/20/2020  1. No acute fracture. 2. Diffuse  soft tissue swelling, nonspecific. 3. Diffuse osteopenia. 4. Peripheral vascular disease. Lonia Skinner, MD  10/20/2020 1:11 PM    CT Head WO Contrast    Result Date: 10/21/2020   No currently detectable acute intracranial hemorrhage or mass effect or edema. Mildly suboptimal study for  detection of small amounts of hemorrhage due to multiple regions of beam hardening artifact in the periphery of the frontal lobes.. Note: Note that CT scanning at this site  utilizes multiple dose reduction techniques including automatic exposure control, adjustment of the MAA and/or KVP according to patient's size and use of iterative reconstruction technique Elyn Peers, MD  10/21/2020 8:16 AM    CT Cervical Spine WO Contrast    Result Date: 10/23/2020   1. No evidence of fracture. 2. Endplate and facet joint degenerative changes throughout cervical spine as described above. Raphael Gibney, DO  10/23/2020 7:39 AM    US Abdomen Complete    Result Date: 10/22/2020   1. Hepatomegaly. No evidence of splenomegaly. Earney Navy, MD  10/22/2020 10:02 AM    CT Boney Pelvis    Result Date: 10/22/2020   No CT evidence for fracture or dislocation of the pelvis or either hip. Edison Simon, MD  10/22/2020 7:44 AM    CT Forearm Left WO Contrast    Result Date: 10/22/2020   No gross CT evidence for fracture or dislocation of the left wrist, radius, or ulna within study limitations as described above. However, nondisplaced fractures cannot be excluded. If there is persistent clinical concern for wrist or elbow fracture, further evaluation with MRI is recommended Edison Simon, MD  10/22/2020 7:38 AM    CT Wrist Left WO Contrast    Result Date: 10/22/2020   No gross CT evidence for fracture or dislocation of the left wrist, radius, or ulna within study limitations as described above. However, nondisplaced fractures cannot be excluded. If there is persistent clinical concern for wrist or elbow fracture, further evaluation with MRI is recommended Edison Simon, MD  10/22/2020 7:38 AM    Chest AP Portable    Result Date: 11/12/2020  No acute process. Guy Sandifer, MD  11/12/2020 2:47 AM    US Venous Low Extrem Duplx Dopp Comp Bilat    Result Date: 10/22/2020   NORMAL VENOUS DUPLEX OF THE LOWER EXTREMITIES. NO EVIDENCE OF INTRALUMINAL THROMBUS OR  OBSTRUCTION TO VENOUS FLOW IN RIGHT OR LEFT LOWER EXTREMITY. Nelma Rothman, MD  10/22/2020 12:35 PM     Treatment Team:   Attending Provider: Colin Broach, MD  Consulting Physician: Mellody Dance, MD  Consulting Physician: Laurice Record, MD    Disposition:   Disposition: Home-Health Care Svc    Condition at Discharge:   good     Follow up Recommendations for Receiving Provider     Follow-up with PCP  Follow-up with Cardiology  Follow-up with Nephrology  Unresulted Labs       None            Discharge Instructions:      Follow-up Information       Pcp, None, MD .                           Peripheral IV 11/12/20 22 G Standard Right Hand (Active)   Site Assessment Clean;Dry;Intact 11/12/20 2200   Line Status Saline Locked;Flushed 11/12/20 2200   Dressing Status Dry;Intact 11/12/20 2200   Dressing/Line Intervention Occlusive device 11/12/20 2200  Dressing Change Due 11/18/20 11/12/20 2200   Reason Not Rotated Not due 11/12/20 2200   Number of days: 1       Graft/Fistula Left (Active)   Number of days:         Discharge Medication List        Taking      amLODIPine 10 MG tablet  Dose: 10 mg  Commonly known as: NORVASC  Take 1 tablet (10 mg total) by mouth daily     aspirin 81 MG chewable tablet  Dose: 81 mg  Chew 81 mg by mouth daily     atorvastatin 40 MG tablet  Dose: 40 mg  Commonly known as: LIPITOR  Take 40 mg by mouth daily     carvedilol 25 MG tablet  Dose: 1 tablet  Commonly known as: COREG  Take 1 tablet by mouth 2 (two) times daily with meals     gabapentin 600 MG tablet  Dose: 600 mg  Commonly known as: NEURONTIN  Take 600 mg by mouth 2 (two) times daily     insulin aspart 100 UNIT/ML injection  Dose: 15 Units  Commonly known as: NovoLOG  Inject 15 Units into the skin 3 (three) times daily before meals     sevelamer 800 MG tablet  Dose: 800 mg  Commonly known as: RENVELA  Take 800 mg by mouth 3 (three) times daily with meals     Tresiba FlexTouch 200 UNIT/ML Sopn  Dose: 35 Units  Generic drug:  Insulin Degludec  Inject 35 Units into the skin nightly     vitamin C 500 MG tablet  Dose: 500 mg  Commonly known as: ASCORBIC ACID  Take 1 tablet (500 mg total) by mouth daily              Signed by: Colin Broach, MD

## 2020-11-13 NOTE — Plan of Care (Signed)
NURSING SHIFT NOTE     Patient: [name]  Day: 1      SHIFT EVENTS     Shift Narrative/Significant Events (PRN med administration, fall, RRT, etc.):     Pt alert and oriented x4, resting in bed. BP 166/73, Hydralazine was given.   Pt on sliding scale, BS 302, 2 Units was given last night.     Call light and personal belongings within reach. Bed in low position, bed alarm on. Rounding in place for safety. Will continue to monitor.               ASSESSMENT     Changes in assessment from patient's baseline this shift:    Neuro: No  CV: No  Pulm: No  Peripheral Vascular: No  HEENT: No  GI: No  BM during shift: No, Last BM: Last BM Date: 11/12/20  GU: No   Integ: No  MS: No    Pain: generalized  Pain Interventions: Medications and Rest  Medications Utilized:     Mobility: PMP Activity: Step 5 - Chair of Distance Walked (ft) (Step 6,7): 12 Feet           Lines     Patient Lines/Drains/Airways Status       Active Lines, Drains and Airways       Name Placement date Placement time Site Days    Peripheral IV 11/12/20 22 G Standard Right Hand 11/12/20  0330  Hand  1    External Urinary Catheter 11/12/20  1330  --  less than 1    Graft/Fistula Left --  --  -- --                         VITAL SIGNS     Vitals:    11/13/20 0314   BP: 138/72   Pulse: 75   Resp: 18   Temp: 97.9 F (36.6 C)   SpO2: 97%       Temp  Min: 97.5 F (36.4 C)  Max: 98.1 F (36.7 C)  Pulse  Min: 68  Max: 82  Resp  Min: 15  Max: 22  BP  Min: 128/75  Max: 198/84  SpO2  Min: 93 %  Max: 98 %      Intake/Output Summary (Last 24 hours) at 11/13/2020 0644  Last data filed at 11/13/2020 7517  Gross per 24 hour   Intake 240 ml   Output 3300 ml   Net -3060 ml            CARE PLAN        Problem: Renal Instability  Goal: Fluid and electrolyte balance are achieved/maintained  Outcome: Progressing  Flowsheets (Taken 11/13/2020 0641)  Fluid and electrolyte balance are achieved/maintained:   Monitor intake and output every shift   Monitor/assess lab values  and report abnormal values   Assess for confusion/personality changes   Assess and reassess fluid and electrolyte status   Observe for seizure activity and initiate seizure precautions if indicated   Observe for cardiac arrhythmias   Monitor for muscle weakness  Goal: Free from infection  Outcome: Progressing  Flowsheets (Taken 11/13/2020 0641)  Free from infection: Monitor/assess for signs and symptoms of infection     Problem: Patient Receiving Advanced Renal Therapies  Goal: Therapy access site remains intact  Outcome: Progressing  Flowsheets (Taken 11/13/2020 0641)  Therapy access site remains intact:   Assess therapy access site   Change therapy  access site dressing as needed     Problem: Safety  Goal: Patient will be free from injury during hospitalization  Outcome: Progressing  Flowsheets (Taken 11/13/2020 [phone])  Patient will be free from injury during hospitalization:   Assess patient's risk for falls and implement fall prevention plan of care per policy   Provide and maintain safe environment   Use appropriate transfer methods   Ensure appropriate safety devices are available at the bedside   Include patient/ family/ care giver in decisions related to safety  Goal: Patient will be free from infection during hospitalization  Outcome: Progressing  Flowsheets (Taken 11/13/2020 0641)  Free from Infection during hospitalization:   Assess and monitor for signs and symptoms of infection   Monitor lab/diagnostic results   Monitor all insertion sites (i.e. indwelling lines, tubes, urinary catheters, and drains)     Problem: Pain  Goal: Pain at adequate level as identified by patient  Outcome: Progressing  Flowsheets (Taken 11/13/2020 0641)  Pain at adequate level as identified by patient:   Identify patient comfort function goal   Assess pain on admission, during daily assessment and/or before any "as needed" intervention(s)   Reassess pain within 30-60 minutes of any procedure/intervention, per Pain Assessment,  Intervention, Reassessment (AIR) Cycle   Evaluate if patient comfort function goal is met   Evaluate patient's satisfaction with pain management progress   Offer non-pharmacological pain management interventions     Problem: Psychosocial and Spiritual Needs  Goal: Demonstrates ability to cope with hospitalization/illness  Outcome: Progressing  Flowsheets (Taken 11/13/2020 0641)  Demonstrates ability to cope with hospitalizations/illness:   Encourage verbalization of feelings/concerns/expectations   Provide quiet environment   Assist patient to identify own strengths and abilities   Encourage participation in diversional activity

## 2020-11-13 NOTE — Discharge Instr - AVS First Page (Addendum)
Reason for your Hospital Admission:  Hypertension, Hyperkalemia      Instructions for after your discharge:  Please follow-up with your primary care doctor for hospital follow-up visit    -At this appointment, please have your platelet count re-checked to assess for improvement      Please follow-up with your regular dialysis schedule and nephrologist    Please follow-up  with your Cardiologist

## 2020-11-13 NOTE — Progress Notes (Signed)
Patient alert, oriented x 3. Denies any pain. Vital signs within normal range. Hypertensive. For extra HD today with high volume UF. Will monitor closely on dialysis.     11/13/20 0930   Bedside Nurse Communication   Name of bedside RN - pre dialysis Phoung-Thao Nguyen   Treatment Initiation- With Dialysis Precautions   Time Out/Safety Check Completed Yes   Consent for HD signed for this hospitalization (Date) 10/20/20   Consent for HD signed for this hospitalization (Time) 1545   Preferred language No   Complication waiver N   Blood Consent Verified Yes   Dialysis Precautions All Connections Secured;Saline Line Double Clamped;Venous Parameters Set;Arterial Parameters Set;Air Foam Detecctor Engaged   Dialysis Treatment Type Routine;Acute Room   Special Considerations hypertensive   Is patient diabetic? Yes   RO/Hemodialysis Architectural technologist   Is Total Chlorine less than 0.1 ppm? Yes   Orignial Total Chlorine Testing Time 0915   RO/Hemodialysis Facilities manager Number 4    Machine Serial Number [id number]   RO # 27   RO Serial # [id number]   Water Hardness NA   pH 7.2   Pressure Test Verified Yes   Alarms Verified Passed   Machine Temperature 97.7 F (36.5 C)   Alarms Verified Yes   Na+ mEq (Machine) 138 mEq   Bicarb mEq (Machine) 35 mEq   Hemodialysis Conductivity (Machine) 13.6   Hemodialysis Conductivity (Meter) 13.6   Dialyzer Lot Number [id number]   Tubing Lot Number [id number]   RO Machine Log Completed Yes   Hepatitis Status   HBsAg (Antigen) Result Negative   HBsAg Date Drawn 10/20/20   HBsAg Repeat Draw Due Date 11/16/20   Dialysis Weight   Pre-Treatment Weight (Kg) 100.5   Scale Type ICU Bed Scale   Vitals   Temp 98.2 F (36.8 C)   Heart Rate 80   Resp Rate 20   BP 200/87   SpO2 98 %   O2 Device None (Room air)   Assessment   Mental Status Alert;Oriented;Cooperative   Cardiac (WDL) WDL   Cardiac Regularity Regular   Cardiac Rhythm Normal Sinus Rhythm   Respiratory  WDL   Respiratory Pattern  Regular   Bilateral Breath Sounds Clear   Edema  WDL   Generalized Edema Non Pitting Edema   Facial Non Pitting Edema   Sacral UTA   RLE Edema Non Pitting Edema   LLE Edema Non Pitting Edema   General Skin Color Appropriate for ethnicity   Skin Condition/Temp Warm;Dry   Gastrointestinal (WDL) WDL   Abdomen Inspection Soft;Nondistended   GI Symptoms None   Mobility Bed   Pain Assessment   Charting Type Assessment   Pain Scale Used Numeric Scale (0-10)   Numeric Pain Scale   Pain Score 0   POSS Score 1   Hemodialysis Comments   Pre-Hemodialysis Comments time out done   Hemodialysis History Information   Outpatient Dialysis Unit [facility]   Chronic Dialysis Patient Yes   Outpatient Nephrologist Dr Posey Pronto

## 2020-11-14 LAB — ECG 12-LEAD
Atrial Rate: 81 {beats}/min
IHS MUSE NARRATIVE AND IMPRESSION: NORMAL
P Axis: 50 degrees
P-R Interval: 152 ms
Q-T Interval: 410 ms
QRS Duration: 74 ms
QTC Calculation (Bezet): 476 ms
R Axis: 73 degrees
T Axis: 112 degrees
Ventricular Rate: 81 {beats}/min

## 2021-01-05 ENCOUNTER — Observation Stay
Admission: EM | Admit: 2021-01-05 | Discharge: 2021-01-12 | Disposition: A | Discharge status: Home Health | Payer: 59 | Attending: Internal Medicine | Admitting: Internal Medicine

## 2021-01-05 ENCOUNTER — Inpatient Hospital Stay: Payer: 59

## 2021-01-05 ENCOUNTER — Emergency Department: Payer: 59

## 2021-01-05 DIAGNOSIS — Z20822 Contact with and (suspected) exposure to covid-19: Secondary | ICD-10-CM | POA: Insufficient documentation

## 2021-01-05 DIAGNOSIS — E1100 Type 2 diabetes mellitus with hyperosmolarity without nonketotic hyperglycemic-hyperosmolar coma (NKHHC): Secondary | ICD-10-CM

## 2021-01-05 DIAGNOSIS — R55 Syncope and collapse: Secondary | ICD-10-CM | POA: Insufficient documentation

## 2021-01-05 DIAGNOSIS — R630 Anorexia: Secondary | ICD-10-CM | POA: Insufficient documentation

## 2021-01-05 DIAGNOSIS — R197 Diarrhea, unspecified: Secondary | ICD-10-CM | POA: Insufficient documentation

## 2021-01-05 DIAGNOSIS — R1084 Generalized abdominal pain: Secondary | ICD-10-CM | POA: Insufficient documentation

## 2021-01-05 DIAGNOSIS — R112 Nausea with vomiting, unspecified: Secondary | ICD-10-CM | POA: Diagnosis present

## 2021-01-05 DIAGNOSIS — E1122 Type 2 diabetes mellitus with diabetic chronic kidney disease: Secondary | ICD-10-CM | POA: Insufficient documentation

## 2021-01-05 DIAGNOSIS — I10 Essential (primary) hypertension: Secondary | ICD-10-CM

## 2021-01-05 DIAGNOSIS — N186 End stage renal disease: Secondary | ICD-10-CM | POA: Insufficient documentation

## 2021-01-05 DIAGNOSIS — R9431 Abnormal electrocardiogram [ECG] [EKG]: Secondary | ICD-10-CM | POA: Insufficient documentation

## 2021-01-05 DIAGNOSIS — E785 Hyperlipidemia, unspecified: Secondary | ICD-10-CM | POA: Insufficient documentation

## 2021-01-05 DIAGNOSIS — Z992 Dependence on renal dialysis: Secondary | ICD-10-CM

## 2021-01-05 DIAGNOSIS — I160 Hypertensive urgency: Secondary | ICD-10-CM | POA: Insufficient documentation

## 2021-01-05 DIAGNOSIS — M7989 Other specified soft tissue disorders: Secondary | ICD-10-CM | POA: Insufficient documentation

## 2021-01-05 DIAGNOSIS — D631 Anemia in chronic kidney disease: Secondary | ICD-10-CM | POA: Insufficient documentation

## 2021-01-05 DIAGNOSIS — Z794 Long term (current) use of insulin: Secondary | ICD-10-CM | POA: Insufficient documentation

## 2021-01-05 DIAGNOSIS — Z7982 Long term (current) use of aspirin: Secondary | ICD-10-CM | POA: Insufficient documentation

## 2021-01-05 DIAGNOSIS — E875 Hyperkalemia: Secondary | ICD-10-CM | POA: Insufficient documentation

## 2021-01-05 DIAGNOSIS — E8770 Fluid overload, unspecified: Secondary | ICD-10-CM | POA: Insufficient documentation

## 2021-01-05 DIAGNOSIS — Z79899 Other long term (current) drug therapy: Secondary | ICD-10-CM | POA: Insufficient documentation

## 2021-01-05 DIAGNOSIS — J811 Chronic pulmonary edema: Principal | ICD-10-CM | POA: Insufficient documentation

## 2021-01-05 DIAGNOSIS — D696 Thrombocytopenia, unspecified: Secondary | ICD-10-CM | POA: Insufficient documentation

## 2021-01-05 DIAGNOSIS — R7989 Other specified abnormal findings of blood chemistry: Secondary | ICD-10-CM | POA: Diagnosis present

## 2021-01-05 DIAGNOSIS — Z6830 Body mass index (BMI) 30.0-30.9, adult: Secondary | ICD-10-CM | POA: Insufficient documentation

## 2021-01-05 DIAGNOSIS — I120 Hypertensive chronic kidney disease with stage 5 chronic kidney disease or end stage renal disease: Secondary | ICD-10-CM | POA: Insufficient documentation

## 2021-01-05 DIAGNOSIS — E1165 Type 2 diabetes mellitus with hyperglycemia: Secondary | ICD-10-CM | POA: Insufficient documentation

## 2021-01-05 DIAGNOSIS — R778 Other specified abnormalities of plasma proteins: Secondary | ICD-10-CM | POA: Insufficient documentation

## 2021-01-05 DIAGNOSIS — R739 Hyperglycemia, unspecified: Secondary | ICD-10-CM

## 2021-01-05 DIAGNOSIS — Z8614 Personal history of Methicillin resistant Staphylococcus aureus infection: Secondary | ICD-10-CM | POA: Insufficient documentation

## 2021-01-05 DIAGNOSIS — R079 Chest pain, unspecified: Secondary | ICD-10-CM | POA: Diagnosis present

## 2021-01-05 DIAGNOSIS — R0602 Shortness of breath: Secondary | ICD-10-CM | POA: Diagnosis present

## 2021-01-05 DIAGNOSIS — N644 Mastodynia: Secondary | ICD-10-CM | POA: Insufficient documentation

## 2021-01-05 LAB — COMPREHENSIVE METABOLIC PANEL
ALT: 28 U/L (ref 0–55)
AST (SGOT): 14 U/L (ref 5–41)
Albumin/Globulin Ratio: 0.9 (ref 0.9–2.2)
Albumin: 2.9 g/dL — ABNORMAL LOW (ref 3.5–5.0)
Alkaline Phosphatase: 188 U/L — ABNORMAL HIGH (ref 37–117)
Anion Gap: 14 (ref 5.0–15.0)
BUN: 69 mg/dL — ABNORMAL HIGH (ref 7.0–21.0)
Bilirubin, Total: 0.6 mg/dL (ref 0.2–1.2)
CO2: 22 mEq/L (ref 17–29)
Calcium: 8.4 mg/dL — ABNORMAL LOW (ref 8.5–10.5)
Chloride: 100 mEq/L (ref 99–111)
Creatinine: 9.4 mg/dL — ABNORMAL HIGH (ref 0.4–1.0)
Globulin: 3.4 g/dL (ref 2.0–3.6)
Glucose: 401 mg/dL — ABNORMAL HIGH (ref 70–100)
Potassium: 4.9 mEq/L (ref 3.5–5.3)
Protein, Total: 6.3 g/dL (ref 6.0–8.3)
Sodium: 136 mEq/L (ref 135–145)

## 2021-01-05 LAB — ECG 12-LEAD
Atrial Rate: 85 {beats}/min
P Axis: 33 degrees
P-R Interval: 146 ms
Q-T Interval: 392 ms
QRS Duration: 76 ms
QTC Calculation (Bezet): 466 ms
R Axis: 48 degrees
T Axis: 133 degrees
Ventricular Rate: 85 {beats}/min

## 2021-01-05 LAB — CBC AND DIFFERENTIAL
Absolute NRBC: 0 10*3/uL (ref 0.00–0.00)
Basophils Absolute Automated: 0.05 10*3/uL (ref 0.00–0.08)
Basophils Automated: 1.1 %
Eosinophils Absolute Automated: 0.06 10*3/uL (ref 0.00–0.44)
Eosinophils Automated: 1.3 %
Hematocrit: 37.8 % (ref 34.7–43.7)
Hgb: 11.6 g/dL (ref 11.4–14.8)
Immature Granulocytes Absolute: 0.01 10*3/uL (ref 0.00–0.07)
Immature Granulocytes: 0.2 %
Lymphocytes Absolute Automated: 1.04 10*3/uL (ref 0.42–3.22)
Lymphocytes Automated: 22.6 %
MCH: 29.3 pg (ref 25.1–33.5)
MCHC: 30.7 g/dL — ABNORMAL LOW (ref 31.5–35.8)
MCV: 95.5 fL (ref 78.0–96.0)
MPV: 13 fL — ABNORMAL HIGH (ref 8.9–12.5)
Monocytes Absolute Automated: 0.46 10*3/uL (ref 0.21–0.85)
Monocytes: 10 %
Neutrophils Absolute: 2.99 10*3/uL (ref 1.10–6.33)
Neutrophils: 64.8 %
Nucleated RBC: 0 /100 WBC (ref 0.0–0.0)
Platelets: 105 10*3/uL — ABNORMAL LOW (ref 142–346)
RBC: 3.96 10*6/uL (ref 3.90–5.10)
RDW: 13 % (ref 11–15)
WBC: 4.61 10*3/uL (ref 3.10–9.50)

## 2021-01-05 LAB — COVID-19 (SARS-COV-2) & INFLUENZA  A/B, NAA (ROCHE LIAT)
Influenza A: NOT DETECTED
Influenza B: NOT DETECTED
SARS CoV 2 Overall Result: NOT DETECTED

## 2021-01-05 LAB — HIGH SENSITIVITY TROPONIN-I WITH DELTA
hs Troponin-I Delta: -4.5 ng/L
hs Troponin-I: 93.3 ng/L — AB

## 2021-01-05 LAB — GLUCOSE WHOLE BLOOD - POCT: Whole Blood Glucose POCT: 299 mg/dL — ABNORMAL HIGH (ref 70–100)

## 2021-01-05 LAB — MAGNESIUM: Magnesium: 2.2 mg/dL (ref 1.6–2.6)

## 2021-01-05 LAB — HEPATITIS B SURFACE ANTIGEN W/ REFLEX TO CONFIRMATION: Hepatitis B Surface Antigen: NONREACTIVE

## 2021-01-05 LAB — HIGH SENSITIVITY TROPONIN-I: hs Troponin-I: 97.8 ng/L — AB

## 2021-01-05 LAB — GFR: EGFR: 5.1

## 2021-01-05 LAB — PHOSPHORUS: Phosphorus: 7 mg/dL — ABNORMAL HIGH (ref 2.3–4.7)

## 2021-01-05 LAB — LACTIC ACID, PLASMA: Lactic Acid: 1.7 mmol/L (ref 0.2–2.0)

## 2021-01-05 MED ORDER — AMLODIPINE BESYLATE 5 MG PO TABS
10.0000 mg | ORAL_TABLET | Freq: Every day | ORAL | Status: DC
Start: 2021-01-05 — End: 2021-01-12
  Administered 2021-01-05 – 2021-01-12 (×8): 10 mg via ORAL
  Filled 2021-01-05 (×8): qty 2

## 2021-01-05 MED ORDER — SODIUM CHLORIDE 0.9 % IV BOLUS
250.0000 mL | INTRAVENOUS | Status: AC | PRN
Start: 2021-01-05 — End: 2021-01-05

## 2021-01-05 MED ORDER — DEXTROSE 5% IV BOLUS
250.0000 mL | INTRAVENOUS | Status: DC | PRN
Start: 2021-01-05 — End: 2021-01-12

## 2021-01-05 MED ORDER — DEXTROSE 50 % IV SOLN
25.0000 g | INTRAVENOUS | Status: DC | PRN
Start: 2021-01-05 — End: 2021-01-12

## 2021-01-05 MED ORDER — MELATONIN 3 MG PO TABS
3.0000 mg | ORAL_TABLET | Freq: Every evening | ORAL | Status: DC | PRN
Start: 2021-01-05 — End: 2021-01-12

## 2021-01-05 MED ORDER — GABAPENTIN 300 MG PO CAPS
300.0000 mg | ORAL_CAPSULE | Freq: Two times a day (BID) | ORAL | Status: DC
Start: 2021-01-05 — End: 2021-01-12
  Administered 2021-01-05 – 2021-01-12 (×15): 300 mg via ORAL
  Filled 2021-01-05 (×15): qty 1

## 2021-01-05 MED ORDER — DEXTROSE 10 % IV BOLUS
25.0000 g | INTRAVENOUS | Status: DC | PRN
Start: 2021-01-05 — End: 2021-01-12

## 2021-01-05 MED ORDER — GLUCAGON 1 MG IJ SOLR (WRAP)
1.0000 mg | INTRAMUSCULAR | Status: DC | PRN
Start: 2021-01-05 — End: 2021-01-12

## 2021-01-05 MED ORDER — INSULIN LISPRO 100 UNIT/ML SOLN (WRAP)
10.0000 [IU] | Freq: Once | Status: AC
Start: 2021-01-05 — End: 2021-01-05
  Administered 2021-01-05: 10 [IU] via SUBCUTANEOUS
  Filled 2021-01-05: qty 30

## 2021-01-05 MED ORDER — INSULIN GLARGINE 100 UNIT/ML SC SOLN
35.0000 [IU] | Freq: Every evening | SUBCUTANEOUS | Status: DC
Start: 2021-01-05 — End: 2021-01-08
  Administered 2021-01-05 – 2021-01-07 (×3): 35 [IU] via SUBCUTANEOUS
  Filled 2021-01-05 (×3): qty 35

## 2021-01-05 MED ORDER — ACETAMINOPHEN 325 MG PO TABS
650.0000 mg | ORAL_TABLET | Freq: Four times a day (QID) | ORAL | Status: DC | PRN
Start: 2021-01-05 — End: 2021-01-12
  Administered 2021-01-11 (×2): 650 mg via ORAL
  Filled 2021-01-05 (×2): qty 2

## 2021-01-05 MED ORDER — SODIUM CHLORIDE 0.9 % IV BOLUS
100.0000 mL | INTRAVENOUS | Status: AC | PRN
Start: 2021-01-05 — End: 2021-01-05

## 2021-01-05 MED ORDER — INSULIN LISPRO 100 UNIT/ML SOLN (WRAP)
1.0000 [IU] | Freq: Three times a day (TID) | Status: DC
Start: 2021-01-05 — End: 2021-01-07
  Administered 2021-01-06: 3 [IU] via SUBCUTANEOUS
  Administered 2021-01-06: 7 [IU] via SUBCUTANEOUS
  Filled 2021-01-05: qty 21
  Filled 2021-01-05: qty 9

## 2021-01-05 MED ORDER — ATORVASTATIN CALCIUM 40 MG PO TABS
40.0000 mg | ORAL_TABLET | Freq: Every evening | ORAL | Status: DC
Start: 2021-01-05 — End: 2021-01-12
  Administered 2021-01-05 – 2021-01-11 (×7): 40 mg via ORAL
  Filled 2021-01-05 (×7): qty 1

## 2021-01-05 MED ORDER — LABETALOL HCL 5 MG/ML IV SOLN (WRAP)
20.0000 mg | Freq: Once | INTRAVENOUS | Status: AC
Start: 2021-01-05 — End: 2021-01-05
  Administered 2021-01-05: 20 mg via INTRAVENOUS
  Filled 2021-01-05: qty 4

## 2021-01-05 MED ORDER — CARVEDILOL 25 MG PO TABS
25.0000 mg | ORAL_TABLET | Freq: Two times a day (BID) | ORAL | Status: DC
Start: 2021-01-05 — End: 2021-01-12
  Administered 2021-01-05 – 2021-01-12 (×13): 25 mg via ORAL
  Filled 2021-01-05 (×13): qty 1

## 2021-01-05 MED ORDER — OXYCODONE-ACETAMINOPHEN 5-325 MG PO TABS
1.0000 | ORAL_TABLET | Freq: Once | ORAL | Status: AC
Start: 2021-01-05 — End: 2021-01-05
  Administered 2021-01-05: 1 via ORAL
  Filled 2021-01-05: qty 1

## 2021-01-05 MED ORDER — ONDANSETRON 4 MG PO TBDP
4.0000 mg | ORAL_TABLET | Freq: Four times a day (QID) | ORAL | Status: DC | PRN
Start: 2021-01-05 — End: 2021-01-12

## 2021-01-05 MED ORDER — ASPIRIN 81 MG PO CHEW
81.0000 mg | CHEWABLE_TABLET | Freq: Every day | ORAL | Status: DC
Start: 2021-01-05 — End: 2021-01-12
  Administered 2021-01-05 – 2021-01-12 (×7): 81 mg via ORAL
  Filled 2021-01-05 (×7): qty 1

## 2021-01-05 MED ORDER — ONDANSETRON HCL 4 MG/2ML IJ SOLN
4.0000 mg | Freq: Once | INTRAMUSCULAR | Status: AC
Start: 2021-01-05 — End: 2021-01-05
  Administered 2021-01-05: 4 mg via INTRAVENOUS
  Filled 2021-01-05: qty 2

## 2021-01-05 MED ORDER — NALOXONE HCL 0.4 MG/ML IJ SOLN (WRAP)
0.2000 mg | INTRAMUSCULAR | Status: DC | PRN
Start: 2021-01-05 — End: 2021-01-12

## 2021-01-05 MED ORDER — HEPARIN SODIUM (PORCINE) 5000 UNIT/ML IJ SOLN
5000.0000 [IU] | Freq: Three times a day (TID) | INTRAMUSCULAR | Status: DC
Start: 2021-01-05 — End: 2021-01-12
  Administered 2021-01-05 (×2): 5000 [IU] via SUBCUTANEOUS
  Filled 2021-01-05 (×2): qty 1

## 2021-01-05 MED ORDER — SEVELAMER CARBONATE 800 MG PO TABS
800.0000 mg | ORAL_TABLET | Freq: Three times a day (TID) | ORAL | Status: DC
Start: 2021-01-05 — End: 2021-01-12
  Administered 2021-01-05 – 2021-01-12 (×21): 800 mg via ORAL
  Filled 2021-01-05 (×26): qty 1

## 2021-01-05 MED ORDER — ONDANSETRON HCL 4 MG/2ML IJ SOLN
4.0000 mg | Freq: Four times a day (QID) | INTRAMUSCULAR | Status: DC | PRN
Start: 2021-01-05 — End: 2021-01-12

## 2021-01-05 NOTE — ED Triage Notes (Signed)
BIBA for c/o SOB, diarrhea an decreased appetite x 3 days. Also reports left breast tenderness. 8/10. HD patient, MWF, missed today. Left arm graft. BGL [phone]

## 2021-01-05 NOTE — ED Notes (Signed)
Pt states she had last dialysis LAST Monday, not this previous Monday.

## 2021-01-05 NOTE — Plan of Care (Signed)
Problem: Patient Receiving Advanced Renal Therapies  Goal: Therapy access site remains intact  Flowsheets (Taken 01/05/2021 1657)  Therapy access site remains intact: Assess therapy access site

## 2021-01-05 NOTE — Consults (Addendum)
[facility]  CONSULT  [phone]      Date Time: 01/05/21 3:27 PM  Patient Name: [name]  Requesting Physician: Adrienne Mocha, MD  Consulting Physician: Gaylyn Lambert, MD, MD    Primary Care Physician: Pcp, None, MD    Reason for Consultation: ESRD      Assessment:     Patient Active Problem List   Diagnosis    Iron deficiency anemia secondary to inadequate dietary iron intake    Sideropenic dysphagia    Peritoneal dialysis catheter dysfunction, initial encounter    Peritonitis    Upper GI bleed    ESRD (end stage renal disease) on dialysis    Generalized abdominal pain    Thrombocytopenia    h/o orthostatic hypotension    Type 2 diabetes mellitus, with long-term current use of insulin    GERD (gastroesophageal reflux disease)    Bowel incontinence    Right sided numbness    Uncontrolled type 2 diabetes mellitus with hyperglycemia    Hypertension    Hyperlipidemia    History of COPD    Gastritis with hemorrhage    History of anemia due to chronic kidney disease    Open wound, abdominal wall, lateral, subsequent encounter    Gout    Dizziness    Fall    History of CVA (cerebrovascular accident)    Pneumonia due to COVID-19 virus    Diarrhea    Hypertensive urgency    Shortness of breath    ESRD (end stage renal disease)    Hyperkalemia    Fluid overload          1. ESRD on HD MWF at [facility] under Dr Corky Mull.   - Missed HD x>1 week.   - Last HD session 12/27/20  2. Hyperkalemia - Mild  3. Metabolic acidosis 2/2 missed HD  4. Anemia of CKD  5. HTN emergency 235/112  6. Volume Overload  7. Diarrhea and Vomiting.     Recommendations:     1. Stat HD today with 4L UF   2. Plan for HD again tomorrow with UF  3. Hold EPO, Hgb at goal  4. Resume all home BP meds.   5. Expect BP to improve with UF   6. Daily BMP  7. Consider GI consult as re-admission for GI complains.         Adrienne Mocha, MD, thank you for this consultation.  We will follow the patient with you during this  hospitalization.  Please contact me with any questions or issues.    Signed by: Gaylyn Lambert, MD  [facility]  [phone] (office)      -----------------------------------------------------------------------------------------------------------        History of Presenting Illness:     [name] is a 65 y.o. female with ESRD on HD MWF at [facility], DM type 2, HTN, HLD who presented to the hospital after missing HD x 1 week. She has been having nausea, vomiting and diarrhea for a week and has been unable to go to HD unit. Her last full HD session was on 12/27/20. She did go one day in between but had explosive diarrhea in HD chair and was sent back home. She had 3 BM today which were loose. Her vomiting has subsided but continues to be nauseous. She is on RA and explains her breathing is so so. Significant LE edema is noted and pulmonary edema on exam. BP was high 235/112 on arrival which responded  to IV treatments. Nephro is consulted for ESRD    Past Medical History:     Past Medical History:   Diagnosis Date    Chronic obstructive pulmonary disease     Diabetes mellitus     Hyperlipidemia     Hypertension     Sleep apnea 2018       Available old records reviewed, including:  notes, radiology, labs     Past Surgical History:     Past Surgical History:   Procedure Laterality Date    EGD, BIOPSY N/A 05/14/2019    Procedure: EGD, BIOPSY;  Surgeon: Ronie Spies, MD;  Location: [facility];  Service: Gastroenterology;  Laterality: N/A;    EXPLORATORY LAPAROTOMY N/A 05/27/2019    Procedure: EXPLORATORY LAPAROTOMY;  Surgeon: Herbert Moors., MD;  Location: [facility];  Service: General;  Laterality: N/A;    JOINT REPLACEMENT      shoulder    JOINT REPLACEMENT      knee    OVARY SURGERY Left     Removal    REMOVAL, FOREIGN BODY N/A 05/27/2019    Procedure: REMOVAL, PERITONEAL DIALYSIS CATHETER;  Surgeon: Herbert Moors., MD;  Location: [facility];  Service: General;   Laterality: N/A;    TUNNELED CATH CHECK/CHANGE ([facility]ATH) N/A 07/03/2019    Procedure: TUNNELED CATH CHECK/CHANGE;  Surgeon: Maureen Ralphs, MD;  Location: [facility];  Service: Interventional Radiology;  Laterality: N/A;    TUNNELED CATH PLACEMENT ([facility]ATH) Bilateral 05/23/2019    Procedure: TUNNELED CATH PLACEMENT;  Surgeon: Jacques Earthly, MD;  Location: [facility];  Service: Interventional Radiology;  Laterality: Bilateral;    TUNNELED CATH PLACEMENT ([facility]ATH) Right 07/31/2019    Procedure: TUNNELED CATH PLACEMENT;  Surgeon: Jacques Earthly, MD;  Location: [facility];  Service: Interventional Radiology;  Laterality: Right;       Family History:     Family History   Problem Relation Age of Onset    Diabetes Mother     Hypertension Mother     Diabetes Father     Hypertension Father     Diabetes Sister     Hypertension Sister     Diabetes Brother     Hypertension Brother     Diabetes Paternal Aunt     Diabetes Paternal Uncle        Social History:     Social History     Socioeconomic History    Marital status: Legally Separated     Spouse name: Not on file    Number of children: Not on file    Years of education: Not on file    Highest education level: Not on file   Occupational History    Not on file   Tobacco Use    Smoking status: Never    Smokeless tobacco: Never   Vaping Use    Vaping Use: Never used   Substance and Sexual Activity    Alcohol use: Never    Drug use: Never    Sexual activity: Not Currently   Other Topics Concern    Not on file   Social History Narrative    Not on file     Social Determinants of Health     Financial Resource Strain: Not on file   Food Insecurity: Not on file   Transportation Needs: Not on file   Physical Activity: Not on file   Stress: Not on file   Social Connections: Not on file   Intimate  Partner Violence: Not on file   Housing Stability: Not on file       Allergies:     Allergies   Allergen Reactions    Lisinopril Hives    Metformin Hives       Medications:   (Not in a  hospital admission)       Scheduled Meds: PRN Meds:    amLODIPine, 10 mg, Oral, Daily  aspirin, 81 mg, Oral, Daily  atorvastatin, 40 mg, Oral, QHS  carvedilol, 25 mg, Oral, Q12H SCH  gabapentin, 300 mg, Oral, BID  heparin (porcine), 5,000 Units, Subcutaneous, Q8H SCH  insulin glargine, 35 Units, Subcutaneous, QHS  insulin lispro, 1-8 Units, Subcutaneous, TID AC  sevelamer, 800 mg, Oral, TID MEALS          Continuous Infusions:   acetaminophen, 650 mg, Q6H PRN  glucagon (rDNA), 1 mg, PRN   And  dextrose, 250 mL, PRN   And  dextrose, 25 g, PRN   And  dextrose, 25 g, PRN  glucagon (rDNA), 1 mg, PRN   And  dextrose, 250 mL, PRN   And  dextrose, 25 g, PRN   And  dextrose, 25 g, PRN  melatonin, 3 mg, QHS PRN  naloxone, 0.2 mg, PRN  ondansetron, 4 mg, Q6H PRN   Or  ondansetron, 4 mg, Q6H PRN  sodium chloride, 100 mL, Q1H PRN  sodium chloride, 250 mL, PRN            Review of Systems:   A comprehensive review of systems was per the HPI and below:     General ROS: no f/c, no weight changes  HEENT ROS: no blurry vision, no oral lesions, no epistaxis  Allergy/Immunology ROS: no new allergic reactions  Hematological and Lymphatic ROS: no known bleeding/clotting disorders  Respiratory ROS: negative for cough, shortness of breath, or wheezing  Cardiovascular ROS: negative for chest pain or dyspnea on exertion  Gastrointestinal ROS: positive for abd/flank pain, nausea, vomiting, diarrhea    Genito-Urinary ROS: negative for dysuria, hematuria, difficulty voiding or nocturia  Musculoskeletal ROS:  negative for trauma or falls, arthralgias  Neurological ROS: no focal weakness, no dizziness  Endocrine ROS: no change in libido, no change in hair distribution  Dermatological ROS: no new rashes or lesions      Physical Exam:     Vitals:    01/05/21 1200 01/05/21 1300 01/05/21 1506 01/05/21 1508   BP: 159/68  153/64 153/64   Pulse: 69 72  83   Resp:    20   Temp:    97.4 F (36.3 C)   TempSrc:    Oral   SpO2: 91% 94%  94%   Weight:        Height:           Intake and Output Summary (Last 24 hours) at Date Time  No intake or output data in the 24 hours ending 01/05/21 1527    Recent weights:  Weight Monitoring 10/21/2020 10/22/2020 10/23/2020 10/25/2020 11/12/2020 11/13/2020 01/05/2021   Height - - - - 157.5 cm - 157.5 cm   Height Method - - - - - - Stated   Weight 107 kg 108.2 kg 108.1 kg 80.876 kg 100.5 kg 100.5 kg 74.844 kg   Weight Method Bed Scale Bed Scale Bed Scale Bed Scale - Standing Scale Stated   BMI (calculated) - - - - 40.6 kg/m2 - 30.2 kg/m2       General: awake, alert, oriented x 3, no  acute distress  HEENT: sclera anicteric, oropharynx clear without lesions, mucous membranes moist  Neck: supple, no lymphadenopathy, no thyromegaly, no JVD, no carotid bruits  Cardiovascular: regular rate and rhythm, no murmurs, rubs or gallops  Lungs: clear to auscultation bilaterally, without wheezing, rhonchi, or rales  Abdomen: soft, non-tender, non-distended, normoactive bowel sounds, no palpable masses, no hepatosplenomegaly, no rebound or guarding  Extremities: no clubbing, cyanosis, or edema  Neuro: A+O x 3, no gross motor/sensory deficits  Skin: no rashes or lesions noted    LUE AV Graft in place with good thrill      Labs:     Recent Labs   Lab 01/05/21  1018   WBC 4.61   Hgb 11.6   Hematocrit 37.8   Platelets 105*     Recent Labs   Lab 01/05/21  1018   Sodium 136   Potassium 4.9   Chloride 100   CO2 22   BUN 69.0*   Creatinine 9.4*   Calcium 8.4*   Albumin 2.9*   Glucose 401*   EGFR 5.1             Invalid input(s): LEUKOCYTESUR        Imaging personally reviewed, including: n/a      cc: Adrienne Mocha, MD  Pcp, None, MD

## 2021-01-05 NOTE — ED Provider Notes (Signed)
EMERGENCY DEPARTMENT HISTORY AND PHYSICAL EXAM     None        Date: 01/05/2021  Patient Name: [name]    History of Presenting Illness     Chief Complaint   Patient presents with    Shortness of Breath    Diarrhea    Loss of appetite       History Provided By: Patient      Additional History: [name] is a 65 y.o. female presenting to the ED with multiple complaints.  Patient has a history of end-stage renal disease, typically gets hemodialysis Mondays, Wednesdays, Fridays.  Patient's last dialysis was a week ago Monday.  States that she has not been going to dialysis due to feeling generally unwell.  She has had a cough that is productive of green sputum.  She has been nauseous and has had multiple episodes of vomiting and diarrhea.  Still feels nauseous today, has not vomited today but has not been able to eat or drink anything.  Reports intermittent abdominal cramping.  No sick contacts.  No unusual foods.  She also reports 2 weeks of left-sided breast pain, 8 out of 10, states that the left breast feels hard and bigger than the right breast.  She has not had a mammogram in a couple of years, previously mammograms were normal.  Denies prior history of coronary artery disease.  She does have type 2 diabetes, hypertension, hyperlipidemia.  Some shortness of breath today that is worse when she walks and she feels like her legs are more swollen.  Her nephrologist is Dr. Corky Mull   Patient last admitted in October 2022 with a similar presentation.    PCP: Pcp, None, MD  SPECIALISTS:    Current Facility-Administered Medications   Medication Dose Route Frequency Provider Last Rate Last Admin    ondansetron (ZOFRAN) injection 4 mg  4 mg Intravenous Once Luz Brazen, MD         Current Outpatient Medications   Medication Sig Dispense Refill    amLODIPine (NORVASC) 10 MG tablet Take 1 tablet (10 mg total) by mouth daily 30 tablet 0    aspirin 81 MG chewable tablet Chew 81 mg by mouth daily       atorvastatin (LIPITOR) 40 MG tablet Take 40 mg by mouth daily      carvedilol (COREG) 25 MG tablet Take 1 tablet by mouth 2 (two) times daily with meals      gabapentin (NEURONTIN) 600 MG tablet Take 600 mg by mouth 2 (two) times daily      insulin aspart (NovoLOG) 100 UNIT/ML injection Inject 15 Units into the skin 3 (three) times daily before meals      sevelamer (RENVELA) 800 MG tablet Take 800 mg by mouth 3 (three) times daily with meals      Tresiba FlexTouch 200 UNIT/ML Solution Pen-injector Inject 35 Units into the skin nightly      vitamin C (ASCORBIC ACID) 500 MG tablet Take 1 tablet (500 mg total) by mouth daily 30 tablet 0       Past History     Past Medical History:  Past Medical History:   Diagnosis Date    Chronic obstructive pulmonary disease     Diabetes mellitus     Hyperlipidemia     Hypertension     Sleep apnea 2018       Past Surgical History:  Past Surgical History:   Procedure Laterality Date  EGD, BIOPSY N/A 05/14/2019    Procedure: EGD, BIOPSY;  Surgeon: Ronie Spies, MD;  Location: [facility];  Service: Gastroenterology;  Laterality: N/A;    EXPLORATORY LAPAROTOMY N/A 05/27/2019    Procedure: EXPLORATORY LAPAROTOMY;  Surgeon: Herbert Moors., MD;  Location: [facility];  Service: General;  Laterality: N/A;    JOINT REPLACEMENT      shoulder    JOINT REPLACEMENT      knee    OVARY SURGERY Left     Removal    REMOVAL, FOREIGN BODY N/A 05/27/2019    Procedure: REMOVAL, PERITONEAL DIALYSIS CATHETER;  Surgeon: Herbert Moors., MD;  Location: [facility];  Service: General;  Laterality: N/A;    TUNNELED CATH CHECK/CHANGE ([facility]ATH) N/A 07/03/2019    Procedure: TUNNELED CATH CHECK/CHANGE;  Surgeon: Maureen Ralphs, MD;  Location: [facility];  Service: Interventional Radiology;  Laterality: N/A;    TUNNELED CATH PLACEMENT ([facility]ATH) Bilateral 05/23/2019    Procedure: TUNNELED CATH PLACEMENT;  Surgeon: Jacques Earthly, MD;  Location: [facility];  Service: Interventional  Radiology;  Laterality: Bilateral;    TUNNELED CATH PLACEMENT ([facility]ATH) Right 07/31/2019    Procedure: TUNNELED CATH PLACEMENT;  Surgeon: Jacques Earthly, MD;  Location: [facility];  Service: Interventional Radiology;  Laterality: Right;       Family History:  Family History   Problem Relation Age of Onset    Diabetes Mother     Hypertension Mother     Diabetes Father     Hypertension Father     Diabetes Sister     Hypertension Sister     Diabetes Brother     Hypertension Brother     Diabetes Paternal Aunt     Diabetes Paternal Uncle        Social History:  Social History     Tobacco Use    Smoking status: Never    Smokeless tobacco: Never   Vaping Use    Vaping Use: Never used   Substance Use Topics    Alcohol use: Never    Drug use: Never       Allergies:  Allergies   Allergen Reactions    Lisinopril Hives    Metformin Hives       Review of Systems     ***    Review of Systems   Constitutional:  Positive for activity change, appetite change, chills and fatigue. Negative for fever.   HENT:  Positive for congestion and sore throat.    Eyes:  Negative for visual disturbance.   Respiratory:  Positive for cough, chest tightness and shortness of breath.    Cardiovascular:  Positive for chest pain and leg swelling.   Gastrointestinal:  Positive for abdominal pain, diarrhea, nausea and vomiting.   Genitourinary:  Negative for flank pain.   Musculoskeletal:  Negative for back pain.   Skin:  Negative for color change.   Neurological:  Negative for headaches.   Hematological:  Does not bruise/bleed easily.   Psychiatric/Behavioral:  Negative for confusion.        Physical Exam   BP (!) 166/93   Pulse 75   Temp 97.4 F (36.3 C) (Oral)   Resp (!) 24   Ht 5\' 2"  (1.575 m)   Wt 74.8 kg   LMP  (LMP Unknown) Comment: post menopausal  SpO2 91%   BMI 30.18 kg/m     ***    Physical Exam  Vitals and nursing note reviewed.   Constitutional:  Appearance: She is well-developed. She is obese.   HENT:      Head:  Normocephalic and atraumatic.      Mouth/Throat:      Mouth: Mucous membranes are moist.      Pharynx: Oropharynx is clear.   Eyes:      Pupils: Pupils are equal, round, and reactive to light.   Cardiovascular:      Rate and Rhythm: Normal rate and regular rhythm.      Pulses: Normal pulses.      Heart sounds: Normal heart sounds. No murmur heard.    No friction rub. No gallop.   Pulmonary:      Effort: Pulmonary effort is normal.      Breath sounds: Rhonchi and rales present.   Chest:      Chest wall: Tenderness present.   Breasts:     Left: Skin change and tenderness present.      Comments: Reproducible tenderness on palpation of the left lower breast, left breast feels larger than the right, there is an area of thickening/hardened skin at the 5 o'clock position, no fluctuance, area feels warm but there is no overlying erythema  Musculoskeletal:      Cervical back: Normal range of motion and neck supple.      Right lower leg: Edema present.      Left lower leg: Edema present.      Comments: Pitting edema bilateral lower extremities to the knees, 2+   Skin:     General: Skin is warm and dry.      Capillary Refill: Capillary refill takes less than 2 seconds.   Neurological:      General: No focal deficit present.      Mental Status: She is alert.   Psychiatric:         Mood and Affect: Mood normal.         Behavior: Behavior normal.         Diagnostic Study Results     Labs -     Results       Procedure Component Value Units Date/Time    High Sensitivity Troponin-I at 0 hrs [[id number]] Collected: 01/05/21 1121    Specimen: Blood Updated: 01/05/21 1124    COVID-19 (SARS-CoV-2) and Influenza A/B, NAA ([facility])- Admission [[id number]] Collected: 01/05/21 1040    Specimen: Culturette from Nasopharyngeal Updated: 01/05/21 1049     Purpose of COVID testing Diagnostic -[facility]     SARS-CoV-2 Specimen Source Nasal Swab    Narrative:      o Collect and clearly label specimen type:  o PREFERRED-Upper respiratory specimen: One  Nasal Swab in  Transport Media.  o Hand deliver to laboratory ASAP  Diagnostic -[facility]    Comprehensive metabolic panel [[id number]]  (Abnormal) Collected: 01/05/21 1018    Specimen: Blood Updated: 01/05/21 1049     Glucose 401 mg/dL      BUN 69.0 mg/dL      Creatinine 9.4 mg/dL      Sodium 136 mEq/L      Potassium 4.9 mEq/L      Chloride 100 mEq/L      CO2 22 mEq/L      Calcium 8.4 mg/dL      Protein, Total 6.3 g/dL      Albumin 2.9 g/dL      AST (SGOT) 14 U/L      ALT 28 U/L      Alkaline Phosphatase 188 U/L      Bilirubin, Total  0.6 mg/dL      Globulin 3.4 g/dL      Albumin/Globulin Ratio 0.9     Anion Gap 14.0    GFR [[id number]] Collected: 01/05/21 1018     Updated: 01/05/21 1049     EGFR 5.1    CBC and differential [[id number]]  (Abnormal) Collected: 01/05/21 1018    Specimen: Blood Updated: 01/05/21 1037     WBC 4.61 x10 3/uL      Hgb 11.6 g/dL      Hematocrit 37.8 %      Platelets 105 x10 3/uL      RBC 3.96 x10 6/uL      MCV 95.5 fL      MCH 29.3 pg      MCHC 30.7 g/dL      RDW 13 %      MPV 13.0 fL      Neutrophils 64.8 %      Lymphocytes Automated 22.6 %      Monocytes 10.0 %      Eosinophils Automated 1.3 %      Basophils Automated 1.1 %      Immature Granulocytes 0.2 %      Nucleated RBC 0.0 /100 WBC      Neutrophils Absolute 2.99 x10 3/uL      Lymphocytes Absolute Automated 1.04 x10 3/uL      Monocytes Absolute Automated 0.46 x10 3/uL      Eosinophils Absolute Automated 0.06 x10 3/uL      Basophils Absolute Automated 0.05 x10 3/uL      Immature Granulocytes Absolute 0.01 x10 3/uL      Absolute NRBC 0.00 x10 3/uL     Lactic Acid [[id number]] Collected: 01/05/21 1018    Specimen: Blood Updated: 01/05/21 1036     Lactic Acid 1.7 mmol/L             Radiologic Studies -   Radiology Results (24 Hour)       Procedure Component Value Units Date/Time    XR Chest  AP Portable [[id number]] Collected: 01/05/21 1045    Order Status: Completed Updated: 01/05/21 1048    Narrative:      History: chest pain    Technique: Single  Portable View    Comparison: 11/12/20    Findings:  There is vascular congestion with mixed interstitial and patchy alveolar  opacities, findings indicating moderate pulmonary edema.   There is no pneumothorax.  There is mild cardiomegaly.     The mediastinum is within normal limits.          Impression:       pulmonary edema     Elyn Peers, MD   01/05/2021 10:46 AM        .    Medical Decision Making   I am the first provider for this patient.    I reviewed the vital signs, available nursing notes, past medical history, past surgical history, family history and social history.    Vital Signs-Reviewed the patient's vital signs.   Patient Vitals for the past 12 hrs:   BP Temp Pulse Resp   01/05/21 1120 (!) 166/93 -- 75 --   01/05/21 1050 158/74 -- 76 --   01/05/21 0944 (!) 235/112 97.4 F (36.3 C) 83 (!) 24       Pulse Oximetry Analysis - Normal 93% on RA    Cardiac Monitor:  Rate: 75  Rhythm:  Normal Sinus Rhythm     EKG:  Interpreted by the EP.  Time Interpreted: 9:42   Rate: 85   Rhythm: Normal Sinus Rhythm    Interpretation: They have heart rate 85, sinus rhythm, normal axis, PACs, otherwise normal intervals, Q waves in the anterior leads, PACs are new compared to prior EKG, no other ST change   Comparison:  12 Nov 2020    Old Medical Records: Old medical records.  Previous electrocardiograms.  Nursing notes.  Previous radiology studies.     Procedure: ***    ED Course: ***    Provider Notes: ***    Critical Care Time:    For Hospitalized Patients:    1. Hospitalization Decision Time:  The decision to admit this patient was made by the emergency provider at ***[time] on 01/05/2021     Clinical Decision Support:         2. Aspirin: {GRAFASPIRIN:39611}    3. Core Measures: ***      Diagnosis     Clinical Impression: No diagnosis found.    Treatment Plan:   ED Disposition       None              _______________________________      Dr. Arthor Captain, MD, is the primary provider for this patient.    _______________________________

## 2021-01-05 NOTE — H&P (Addendum)
[facility]      Patient: [name]  Date: 01/05/2021   DOB: Jun 23, 1955  Admission Date: 01/05/2021   MRN: [id number]  Attending: Adrienne Mocha, MD  Please contact me on Epic secure chat        Chief Complaint   Patient presents with    Shortness of Breath    Diarrhea    Loss of appetite       Source of History: History is provided by the patient and chart review.   Discussed with ED physician    HISTORY AND PHYSICAL     [name] is a 65 y.o. female with a PMH of HTN, HLD, DM2, ESRD on HD MWF presenting to the ED for shortness of breath.  Patient states she has not been to dialysis since last Monday because she has not been feeling well due to body aches, abdominal pain nausea vomiting and diarrhea.  Reports chest pain, shortness of breath and leg swelling.  States she did not take [place] 35 units last night.  Denies syncope, fever, hematemesis, hematochezia, melena, dysuria, hematuria, focal weakness.     Past Medical History:  Past Medical History:   Diagnosis Date    Chronic obstructive pulmonary disease     Diabetes mellitus     Hyperlipidemia     Hypertension     Sleep apnea 2018       Past Surgical History:  Past Surgical History:   Procedure Laterality Date    EGD, BIOPSY N/A 05/14/2019    Procedure: EGD, BIOPSY;  Surgeon: Ronie Spies, MD;  Location: [facility];  Service: Gastroenterology;  Laterality: N/A;    EXPLORATORY LAPAROTOMY N/A 05/27/2019    Procedure: EXPLORATORY LAPAROTOMY;  Surgeon: Herbert Moors., MD;  Location: [facility];  Service: General;  Laterality: N/A;    JOINT REPLACEMENT      shoulder    JOINT REPLACEMENT      knee    OVARY SURGERY Left     Removal    REMOVAL, FOREIGN BODY N/A 05/27/2019    Procedure: REMOVAL, PERITONEAL DIALYSIS CATHETER;  Surgeon: Herbert Moors., MD;  Location: [facility];  Service: General;  Laterality: N/A;    TUNNELED CATH CHECK/CHANGE ([facility]ATH) N/A 07/03/2019    Procedure: TUNNELED CATH CHECK/CHANGE;  Surgeon:  Maureen Ralphs, MD;  Location: [facility];  Service: Interventional Radiology;  Laterality: N/A;    TUNNELED CATH PLACEMENT ([facility]ATH) Bilateral 05/23/2019    Procedure: TUNNELED CATH PLACEMENT;  Surgeon: Jacques Earthly, MD;  Location: [facility];  Service: Interventional Radiology;  Laterality: Bilateral;    TUNNELED CATH PLACEMENT ([facility]ATH) Right 07/31/2019    Procedure: TUNNELED CATH PLACEMENT;  Surgeon: Jacques Earthly, MD;  Location: [facility];  Service: Interventional Radiology;  Laterality: Right;       Medications:   Prior to Admission medications    Medication Sig Start Date End Date Taking? Authorizing Provider   albuterol sulfate HFA (PROVENTIL) 108 (90 Base) MCG/ACT inhaler Inhale 2 puffs into the lungs every 6 (six) hours as needed for Wheezing or Shortness of Breath    [provider]   amLODIPine (NORVASC) 10 MG tablet Take 1 tablet (10 mg total) by mouth daily 01/07/20   Winfred, Carolan Clines, MD   aspirin 81 MG chewable tablet Chew 81 mg by mouth daily    [provider]   atorvastatin (LIPITOR) 40 MG tablet Take 40 mg by mouth daily    [provider]   carvedilol (COREG) 25 MG tablet Take 1 tablet by mouth 2 (two) times daily with meals 07/22/20   [provider]   gabapentin (NEURONTIN) 600 MG tablet Take 600 mg by mouth 2 (two) times daily 10/21/19   [provider]   insulin aspart (NovoLOG) 100 UNIT/ML injection Inject 15 Units into the skin 3 (three) times daily before meals    [provider]   sevelamer (RENVELA) 800 MG tablet Take 800 mg by mouth 3 (three) times daily with meals    [provider]   Tyler Aas FlexTouch 200 UNIT/ML Solution Pen-injector Inject 35 Units into the skin nightly 04/01/19   [provider]   vitamin C (ASCORBIC ACID) 500 MG tablet Take 1 tablet (500 mg total) by mouth daily 06/30/19   Dion Body, MD       Allergies:   Allergies   Allergen Reactions    Lisinopril Hives    Metformin Hives        Family History:   Family History   Problem Relation Age of Onset    Diabetes Mother     Hypertension Mother     Diabetes Father     Hypertension Father     Diabetes Sister     Hypertension Sister     Diabetes Brother     Hypertension Brother     Diabetes Paternal Aunt     Diabetes Paternal Uncle        Social History:   Social History     Socioeconomic History    Marital status: Legally Separated   Tobacco Use    Smoking status: Never    Smokeless tobacco: Never   Vaping Use    Vaping Use: Never used   Substance and Sexual Activity    Alcohol use: Never    Drug use: Never    Sexual activity: Not Currently             REVIEW OF SYSTEMS   ROS: Pertinent positives and negatives as noted in the HPI. All other systems were reviewed and are negative.     PHYSICAL EXAM      Vital Signs (most recent): BP 159/68   Pulse 72   Temp 97.4 F (36.3 C) (Oral)   Resp (!) 24   Ht 1.575 m (5\' 2" )   Wt 74.8 kg (165 lb)   LMP  (LMP Unknown) Comment: post menopausal  SpO2 94%   BMI 30.18 kg/m   Constiutional: NAD. Pleasant, alert and cooperative. Nontoxic appearing.  Appears comfortable.  HEENT: NCAT, PERRL, conjunctivae/corneas clear. No scleral icterus.   No nasal discharge. Oral mucosa moist.    Neck: Supple, no meningismus   Cardiovascular: RRR, normal S1 S2, no murmurs   Respiratory:   bibasilar crackles, symmetric air entry.   no wheezes    Gastrointestinal: +BS, soft, non-tender, non-distended. No rebound, rigidity or guarding.   Genitourinary: no suprapubic or costovertebral angle tenderness  Musculoskeletal: ROM and motor strength normal. No joint tenderness, deformity or swelling  Skin: no rashes, jaundice or other lesions. normal coloration and turgor  Extremities: warm. 2+ BLE pitting edema. Pulses 2+. Capillary refill < 3s  Neurologic: AAOx3.  Face symmetric.  No focal deficits noted.   Appropriately answering questions and following commands.   Patient speaks freely in full sentences. Normal speech.    Psychiatric: affect and mood appropriate. The patient is alert, interactive, appropriate.        LABS & IMAGING  Recent Results (from the past 24 hour(s))   Lactic Acid    Collection Time: 01/05/21 10:18 AM   Result Value Ref Range    Lactic Acid 1.7 0.2 - 2.0 mmol/L   Comprehensive metabolic panel    Collection Time: 01/05/21 10:18 AM   Result Value Ref Range    Glucose 401 (H) 70 - 100 mg/dL    BUN 69.0 (H) 7.0 - 21.0 mg/dL    Creatinine 9.4 (H) 0.4 - 1.0 mg/dL    Sodium 136 135 - 145 mEq/L    Potassium 4.9 3.5 - 5.3 mEq/L    Chloride 100 99 - 111 mEq/L    CO2 22 17 - 29 mEq/L    Calcium 8.4 (L) 8.5 - 10.5 mg/dL    Protein, Total 6.3 6.0 - 8.3 g/dL    Albumin 2.9 (L) 3.5 - 5.0 g/dL    AST (SGOT) 14 5 - 41 U/L    ALT 28 0 - 55 U/L    Alkaline Phosphatase 188 (H) 37 - 117 U/L    Bilirubin, Total 0.6 0.2 - 1.2 mg/dL    Globulin 3.4 2.0 - 3.6 g/dL    Albumin/Globulin Ratio 0.9 0.9 - 2.2    Anion Gap 14.0 5.0 - 15.0   CBC and differential    Collection Time: 01/05/21 10:18 AM   Result Value Ref Range    WBC 4.61 3.10 - 9.50 x10 3/uL    Hgb 11.6 11.4 - 14.8 g/dL    Hematocrit 37.8 34.7 - 43.7 %    Platelets 105 (L) 142 - 346 x10 3/uL    RBC 3.96 3.90 - 5.10 x10 6/uL    MCV 95.5 78.0 - 96.0 fL    MCH 29.3 25.1 - 33.5 pg    MCHC 30.7 (L) 31.5 - 35.8 g/dL    RDW 13 11 - 15 %    MPV 13.0 (H) 8.9 - 12.5 fL    Neutrophils 64.8 None %    Lymphocytes Automated 22.6 None %    Monocytes 10.0 None %    Eosinophils Automated 1.3 None %    Basophils Automated 1.1 None %    Immature Granulocytes 0.2 None %    Nucleated RBC 0.0 0.0 - 0.0 /100 WBC    Neutrophils Absolute 2.99 1.10 - 6.33 x10 3/uL    Lymphocytes Absolute Automated 1.04 0.42 - 3.22 x10 3/uL    Monocytes Absolute Automated 0.46 0.21 - 0.85 x10 3/uL    Eosinophils Absolute Automated 0.06 0.00 - 0.44 x10 3/uL    Basophils Absolute Automated 0.05 0.00 - 0.08 x10 3/uL    Immature Granulocytes Absolute 0.01 0.00 - 0.07 x10 3/uL    Absolute NRBC 0.00 0.00 - 0.00 x10 3/uL    GFR    Collection Time: 01/05/21 10:18 AM   Result Value Ref Range    EGFR 5.1    COVID-19 (SARS-CoV-2) and Influenza A/B, NAA ([facility])- Admission    Collection Time: 01/05/21 10:40 AM    Specimen: Nasopharyngeal; Culturette   Result Value Ref Range    Purpose of COVID testing Diagnostic -PUI     SARS-CoV-2 Specimen Source Nasal Swab     SARS CoV 2 Overall Result Not Detected     Influenza A Not Detected     Influenza B Not Detected    High Sensitivity Troponin-I at 0 hrs    Collection Time: 01/05/21 11:21 AM   Result Value Ref Range    hs Troponin-I 97.8 (  A) SEE BELOW ng/L            IMAGING:   CT Abd/ Pelvis without Contrast    Result Date: 01/05/2021  1. No evidence of ureteral stone or hydronephrosis. 2. Anasarca.  Elnita Maxwell, MD  01/05/2021 1:31 PM    XR Chest  AP Portable    Result Date: 01/05/2021   pulmonary edema Elyn Peers, MD  01/05/2021 10:46 AM          EKG Interpretation (upon my review):     NSR HR 85 no ST changes     Markers:  Recent Labs   Lab 01/05/21  1121   hs Troponin-I 97.8*         Orders Placed This Encounter   Procedures    COVID-19 (SARS-CoV-2) and Influenza A/B, NAA ([facility])- Admission    XR Chest  AP Portable    CT Abd/ Pelvis without Contrast    Lactic Acid    Comprehensive metabolic panel    CBC and differential    GFR    High Sensitivity Troponin-I at 0 hrs    High Sensitivity Troponin-I at 2 hrs with calculated Delta    Hepatitis B (HBV) Surface Antigen w/ Reflex to Confirmation    CBC without differential    Renal function panel    Urinalysis Reflex to Microscopic Exam- Reflex to Culture    Diet renal Protein restriction: 80 GM Protein; Additional restrictions: CARDIAC, CONSISTENT CARBOHYDRATE    Notify    Hemodialysis Interventions    Catheter care (fistula and graft)    Vital signs    Pulse Oximetry    Progressive Mobility Protocol    Notify physician    NSG Communication: Glucose POCT order (PRN hypoglycemia)    I/O    Height    Weight    Skin assessment    Nursing  communication: Adult Hypoglycemia Treatment Algorithm    Place sequential compression device    Maintain sequential compression device    Education: Activity    Education: Disease Process & Condition    Education: Pain Management    Education: Falls Risk    Education: Smoking Cessation    Incentive spirometry nursing    Education: Heparin    Notify Physician (Critical Blood Glucose Value)    Notify physician (Communication: Document Abnormal Blood Glucose)    Target Glucose Goals    Nursing communication    NSG Communication: Glucose POCT order (PRN hypoglycemia)    If NPO, give half NPH dose    Nursing communication: Adult Hypoglycemia Treatment Algorithm    NSG Communication: Glucose POCT order    Full Code    ED Unit Sec Comm Order    ECG 12 Lead    Saline lock IV    Hemodialysis inpatient    Admit to Inpatient    Place (admit) for Observation Services    Supervise For Meals Frequency: All meals       ASSESSMENT & PLAN     Patient Active Hospital Problem List:       Fluid overload (01/05/2021)              [name] is a 65 y.o. female with a PMH of HTN, HLD, DM2, ESRD on HD MWF admitted with Fluid overload.      #Pulmonary edema  #ESRD on HD MWF  #Hypertensive urgency  -s/p IV labetalol in the ED with improvement.  Missed dialysis. Nephrology consulted.  Plan for HD today.  Continue home amlodipine and Coreg.  Monitor on telemetry    #Elevated troponin  #Chest pain and shortness of breath  -Likely secondary to pulmonary edema and hypertensive urgency.   -EKG reviewed, mild troponin elevation.   -Cardiology consulted  -Continue aspirin and statin  -Trend troponins  -Monitor on telemetry    #Abdominal pain  #Nausea vomiting diarrhea  -Likely viral etiology.  Abdomen benign on exam. Afebrile, no leukocytosis.  COVID-19 and influenza negative.  CXR with no infiltrate. CT abdomen pelvis shows anasarca otherwise no acute findings.    Followup UA.  As needed pain meds and antiemetics      #DM with  hyperglycemia  -BG 401. s/p 10 units insulin lispro in the ED.  Lantus 35 units qhs sliding scale insulin.    #Left breast pain  -In the ED, patient reported left breast pain.  Per ED physician, left breast appears slightly larger than right but no induration, fluctuance or erythema.  Will need inpatient versus outpatient ultrasound for further evaluation.    #HLD  -Continue Lipitor    #Thrombocytopenia  -Appears chronic per chart review.  No evidence of bleeding.  Monitor.    BMI Assessment:  - Body mass index is 30.18 kg/m.  - Meets criteria for overweight given BMI between 25 and 30 due to excess calories/nutritional status. Weight loss and lifestyle modifications encouraged.          OTHER  Code Status: FULL code   DVT prophylaxis:   heparin sc   Disposition: Admit to the floor as observation status    Patient updated on plan of care.  All questions answered.            Signed,  Adrienne Mocha, MD               This chart was generated using hospital voice-recognition software which does not employ spell-checking or grammar-checking features. It was dictated, all or in part, in a busy and often noisy patient care environment. I have taken all usual measures to dictate carefully and to review all aspects this chart. Nonetheless, given the known and well-documented performance characteristics of VR software in such patient care environments, this dictation still may contain unrecognized and wholly unintended errors or omissions

## 2021-01-05 NOTE — ED Notes (Signed)
Bed: BL17  Expected date: 01/05/21  Expected time: 9:27 AM  Means of arrival: [id number] [place]  Comments:  [id number]

## 2021-01-05 NOTE — Progress Notes (Signed)
Completed 3.5 hours of HD. 4.1 L UF tolerated well. Hypertensive throughout treatment. For HD again tomorrow. Report given to [facility] RN.     01/05/21 2030   Treatment Summary   Time Off Machine 2025   Duration of Treatment (Hours) 3.5   Treatment Type 2:1   Dialyzer Clearance Moderately streaked   Fluid Volume Off (mL) 4500   Prime Volume (mL) 200   Rinseback Volume (mL) 200   Fluid Given: Normal Saline (mL) 0   Fluid Given: PRBC  0 mL   Fluid Given: Albumin (mL) 0   Fluid Given: Other (mL) 0   Total Fluid Given 400   Hemodialysis Net Fluid Removed 4100   Post Treatment Assessment   Post-Treatment Weight (Kg) 70.7   Patient Response to Treatment tolerated well   Vitals   Temp 98.2 F (36.8 C)   Heart Rate 69   Resp Rate 20   BP 186/88   SpO2 98 %   O2 Device None (Room air)   Assessment   Mental Status Alert;Oriented;Cooperative   Cardiac (WDL) WDL   Cardiac Regularity Regular   Cardiac Rhythm Normal Sinus Rhythm   Respiratory  WDL   Respiratory Pattern Regular   Bilateral Breath Sounds Clear;Diminished   Edema  X   Generalized Edema Non Pitting Edema   Facial Non Pitting Edema   Sacral UTA   RLE Edema Non Pitting Edema   LLE Edema Non Pitting Edema   General Skin Color Appropriate for ethnicity   Skin Condition/Temp Warm;Dry   Gastrointestinal (WDL) WDL   Abdomen Inspection Soft;Nondistended   GI Symptoms None   Mobility Bed   Pain Assessment   Charting Type Assessment   Pain Scale Used Numeric Scale (0-10)   Numeric Pain Scale   Pain Score 0   POSS Score 1   Education   Person taught Patient   Knowledge basis Substantial   Topics taught Procedure;Access care   Malcom Understanding   Bedside Nurse Communication   Name of bedside RN - post dialysis Shirleen Schirmer

## 2021-01-05 NOTE — Progress Notes (Signed)
Pt arrived on unit from the ED @ 1600 via stretcher.  Patient A&Ox4 and VSS.  Pt rates pain as 0/10.  Pt. Has no complains of nausea/vomiting.  Pt oriented to room, and call bell system.  White board filled out.  Head to toe performed.  Orders released and implemented.  Admission hx completed. Telemetry applied and dual person skin assessment done. Pt left in room resting peacefully.  Will continue to monitor pt and perform hourly rounding.                             FOUR EYES SKIN ASSESSMENT NOTE    SHERLEY MCKENNEY  16-Sep-1955  [id number]    Braden Scale Score: 20    POC Initiated for Risk for Altered Skin Yes    Patient Assessed for Correct Mattress Surface Yes  *At risk patients with Braden Score less than 12 must be considered for specialty bed    Mepilex or Adhesive Foam Dressing applied to sacrum/heel if any PI risk factors present: No      If Wound/Pressure Injury present:    Wound/PI assessment documented in EHR: No      Admitting physician notified: Yes    Wound consult ordered: No      Nemiah Commander, RN  January 05, 2021  4:16 PM    Second RN/PCT Name:  Ernest Mallick, CT

## 2021-01-05 NOTE — ED Notes (Signed)
[name]  ED NURSING NOTE FOR THE RECEIVING INPATIENT NURSE   ED [place]   [place] [phone]   ED CHARGE RN JIngle   ADMISSION INFORMATION   [name] is a 65 y.o. female admitted with an ED diagnosis of:    1. Fluid overload         Isolation: None   Allergies: Lisinopril and Metformin   Holding Orders confirmed? Yes   Belongings Documented? Yes   Home medications sent to pharmacy confirmed? N/A   NURSING CARE   Patient Comes From:   Mental Status: Home Independent  alert and oriented   ADL: Independent with all ADLs   Ambulation: 1 person assist   Pertinent Information  and Safety Concerns:       Last dialysis was last Monday. Pt c/o SOB, O2 sat 94% on room air. left arm graft, positive for T&B.Pt resting comfortable at this time.      CT / NIH   CT Head ordered on this patient?  No   NIH/Dysphagia assessment done prior to admission? No   VITAL SIGNS (at the time of this note)      Vitals:    01/05/21 1508   BP: 153/64   Pulse: 83   Resp: 20   Temp: 97.4 F (36.3 C)   SpO2: 94%

## 2021-01-05 NOTE — Plan of Care (Signed)
Problem: Renal Instability  Goal: Fluid and electrolyte balance are achieved/maintained  Flowsheets (Taken 01/05/2021 1657)  Fluid and electrolyte balance are achieved/maintained:   Monitor intake and output every shift   Monitor daily weight   Monitor/assess lab values and report abnormal values   Assess for confusion/personality changes   Assess and reassess fluid and electrolyte status   Observe for cardiac arrhythmias   Follow fluid restrictions/IV/PO parameters

## 2021-01-05 NOTE — Progress Notes (Signed)
Arrived via bed. Pt. Alert and oriented x3. Pt. Has skipped dialysis for 1 week. Pt. With generalized edema 2+. Pt. C/o hardness in left breast. Telemetry SR. Good bruit and thrill in LAF. Consent on file. C/o discomfort in chest when deep breathing. No sob noted. Report received from [place]   01/05/21 1647   Dialysis Weight   Pre-Treatment Weight (Kg) 102.9   Scale Type ICU Bed Scale   Vitals   Temp 97.7 F (36.5 C)   Heart Rate 69   Resp Rate (!) 32   SpO2 95 %   Assessment   Mental Status Alert;Oriented   Cardiac (WDL) WDL   Cardiac Regularity Regular   Cardiac Rhythm Normal Sinus Rhythm   Respiratory  WDL   Respiratory Pattern Regular;Easy   Bilateral Breath Sounds Clear;Diminished   Edema  X   Generalized Edema +2   RLE Edema +2   LLE Edema +2   General Skin Color Appropriate for ethnicity   Skin Condition/Temp Warm;Dry   Gastrointestinal (WDL) WDL   Abdomen Inspection Rounded   GI Symptoms None   Mobility Bed   Pain Assessment   Charting Type Assessment   Pain Scale Used Numeric Scale (0-10)   Numeric Pain Scale   Pain Score 0   POSS Score 1   Hemodialysis Comments   Pre-Hemodialysis Comments time out done

## 2021-01-05 NOTE — Consults (Signed)
[facility] REPORT  [facility]        Date Time: 01/05/21 5:01 PM  Patient Name: [name]  Requesting Physician: Adrienne Mocha, MD  Consulting Cardiologist:  Jamelle Rushing, MD  MRN:  [id number]  CSN:   [id number]  Date of Admission:  01/05/2021       Reason for Consultation:   Elevated troponin    History:   [name] is a 65 y.o. female admitted on 01/05/2021.  We have been asked by Adrienne Mocha, MD,  to provide cardiac consultation, regarding troponin elevation.  The patient presented to the hospital with left breast pain/breast firmness. She had been feeling generally unwell over the last week and missed 4 dialysis sessions. She was hypertensive on arrival with systolic blood pressure > 220mmHg. She complains of chronic exertional dyspnea and chest pain upon taking a deep breath. She denies exertional chest pain.  She has history of diabetes, hypertension, insulin-dependent DM and ESRD.    I saw her in dialysis.    Hs trop: 97 --> 93    EKG:  Sinus rhythm  PAC  Normal axis  Mild QT prolongation    CXR:  pulmonary edema     CT ap:  1. No evidence of ureteral stone or hydronephrosis.   2. Anasarca.    Past Medical History:     Past Medical History:   Diagnosis Date    Chronic obstructive pulmonary disease     Diabetes mellitus     Hyperlipidemia     Hypertension     Sleep apnea 2018       Past Surgical History:     Past Surgical History:   Procedure Laterality Date    EGD, BIOPSY N/A 05/14/2019    Procedure: EGD, BIOPSY;  Surgeon: Ronie Spies, MD;  Location: [facility];  Service: Gastroenterology;  Laterality: N/A;    EXPLORATORY LAPAROTOMY N/A 05/27/2019    Procedure: EXPLORATORY LAPAROTOMY;  Surgeon: Herbert Moors., MD;  Location: [facility];  Service: General;  Laterality: N/A;    JOINT REPLACEMENT      shoulder    JOINT REPLACEMENT      knee    OVARY SURGERY Left     Removal    REMOVAL, FOREIGN BODY N/A 05/27/2019    Procedure:  REMOVAL, PERITONEAL DIALYSIS CATHETER;  Surgeon: Herbert Moors., MD;  Location: [facility];  Service: General;  Laterality: N/A;    TUNNELED CATH CHECK/CHANGE (PERMCATH) N/A 07/03/2019    Procedure: TUNNELED CATH CHECK/CHANGE;  Surgeon: Maureen Ralphs, MD;  Location: [facility];  Service: Interventional Radiology;  Laterality: N/A;    TUNNELED CATH PLACEMENT (PERMCATH) Bilateral 05/23/2019    Procedure: TUNNELED CATH PLACEMENT;  Surgeon: Jacques Earthly, MD;  Location: [facility];  Service: Interventional Radiology;  Laterality: Bilateral;    TUNNELED CATH PLACEMENT (PERMCATH) Right 07/31/2019    Procedure: TUNNELED CATH PLACEMENT;  Surgeon: Jacques Earthly, MD;  Location: [facility];  Service: Interventional Radiology;  Laterality: Right;       Family History:     Family History   Problem Relation Age of Onset    Diabetes Mother     Hypertension Mother     Diabetes Father     Hypertension Father     Diabetes Sister     Hypertension Sister     Diabetes Brother     Hypertension Brother     Diabetes Paternal Aunt  Diabetes Paternal Uncle        Social History:     Social History     Socioeconomic History    Marital status: Legally Separated     Spouse name: Not on file    Number of children: Not on file    Years of education: Not on file    Highest education level: Not on file   Occupational History    Not on file   Tobacco Use    Smoking status: Never    Smokeless tobacco: Never   Vaping Use    Vaping Use: Never used   Substance and Sexual Activity    Alcohol use: Never    Drug use: Never    Sexual activity: Not Currently   Other Topics Concern    Not on file   Social History Narrative    Not on file     Social Determinants of Health     Financial Resource Strain: Not on file   Food Insecurity: Not on file   Transportation Needs: Not on file   Physical Activity: Not on file   Stress: Not on file   Social Connections: Not on file   Intimate Partner Violence: Not on file   Housing Stability: Not on  file       Allergies:     Allergies   Allergen Reactions    Lisinopril Hives    Metformin Hives       Medications:     Medications Prior to Admission   Medication Sig    albuterol sulfate HFA (PROVENTIL) 108 (90 Base) MCG/ACT inhaler Inhale 2 puffs into the lungs every 6 (six) hours as needed for Wheezing or Shortness of Breath    amLODIPine (NORVASC) 10 MG tablet Take 1 tablet (10 mg total) by mouth daily    aspirin 81 MG chewable tablet Chew 81 mg by mouth daily    atorvastatin (LIPITOR) 40 MG tablet Take 40 mg by mouth daily    carvedilol (COREG) 25 MG tablet Take 1 tablet by mouth 2 (two) times daily with meals    gabapentin (NEURONTIN) 600 MG tablet Take 600 mg by mouth 2 (two) times daily    insulin aspart (NovoLOG) 100 UNIT/ML injection Inject 15 Units into the skin 3 (three) times daily before meals    sevelamer (RENVELA) 800 MG tablet Take 800 mg by mouth 3 (three) times daily with meals    Tresiba FlexTouch 200 UNIT/ML Solution Pen-injector Inject 35 Units into the skin nightly    vitamin C (ASCORBIC ACID) 500 MG tablet Take 1 tablet (500 mg total) by mouth daily        Current Facility-Administered Medications   Medication Dose Route Frequency    amLODIPine  10 mg Oral Daily    aspirin  81 mg Oral Daily    atorvastatin  40 mg Oral QHS    carvedilol  25 mg Oral Q12H SCH    gabapentin  300 mg Oral BID    heparin (porcine)  5,000 Units Subcutaneous Q8H [place]    insulin glargine  35 Units Subcutaneous QHS    insulin lispro  1-8 Units Subcutaneous TID AC    sevelamer  800 mg Oral TID MEALS         Review of Systems:    Comprehensive review of systems including constitutional, eyes, ears, nose, mouth, throat, cardiovascular, GI, GU, musculoskeletal, integumentary, respiratory, neurologic, psychiatric, and endocrine is negative other than what is mentioned already in the history  of present illness    Physical Exam:     VITAL SIGNS PHYSICAL EXAM   Vitals:    01/05/21 1655   BP: (!) 131/99   Pulse: 67   Resp:     Temp:    SpO2:      Temp (24hrs), Avg:97.5 F (36.4 C), Min:97.4 F (36.3 C), Max:97.7 F (36.5 C)      Intake and Output Summary (Last 24 hours) at Date Time  No intake or output data in the 24 hours ending 01/05/21 1701   Physical Exam  General: awake, alert, breathing comfortably, no acute distress  Head: normocephalic  Eyes: Lids & conjunctiva normal  Cardiovascular: regular rate and rhythm, normal S1, S2, no S3, no S4, no murmurs, rubs or gallops  Neck: JVD difficult to assess - habitus.  Lungs: clear to auscultation bilateraly anteriorly, without wheezing, rhonchi, or rales  Abdomen: soft, non-tender.  Extremities: + edema, warm.  Pulse: 2+ radial pulse.  Neurological: No gross motor defect  Psychiatric: Alert and oriented X3, mood and affect normal  Musculoskeletal: normal strength and tone     Labs Reviewed:     Recent Labs   Lab 01/05/21  1502 01/05/21  1121   hs Troponin-I 93.3* 97.8*   hs Troponin-I Delta -4.5  --              Recent Labs   Lab 01/05/21  1018   Bilirubin, Total 0.6   Protein, Total 6.3   Albumin 2.9*   ALT 28   AST (SGOT) 14     Recent Labs   Lab 01/05/21  1018   Magnesium 2.2         Recent Labs   Lab 01/05/21  1018   WBC 4.61   Hgb 11.6   Hematocrit 37.8   Platelets 105*     Recent Labs   Lab 01/05/21  1018   Sodium 136   Potassium 4.9   Chloride 100   CO2 22   BUN 69.0*   Creatinine 9.4*   EGFR 5.1   Glucose 401*   Calcium 8.4*       DIAGNOSTIC    EKG:   See above    chest X-ray  See above    Echo 07/2019:  * limited study for reevaluation pericardial effusion.    * Small to moderate pericardial effusion without evidence of tamponade(no rv  collapse, no ivc plethora, there is respiratory variation of mitral valve  inflow).    * Pleural effusion present.    * Normal left ventricular function ef 65%.  Assessment:   Elevated troponin: [place]and in setting of hypertensive crisis, flash pulmonary edema and missed dialysis. No ischemic chest pain or ischemic EKG changes.  ESRD  Morbid  obesity  Chronic hypertension  History of insulin dependent diabetes    Recommendations:   Would not treat for acute coronary syndrome.  Likely has underlying stable coronary disease  Continue aspirin, statin for presumed CAD.  Continue antihypertensives and titrate as needed for BP control.  Volume control with dialysis.    Signed by: Jamelle Rushing, MD    [place] [phone] or [phone] (8am-5pm)  Office/Pager: [phone]  NP Spectralink [phone]   After hours, non urgent consult line [phone]  After Hours, urgent consults [phone]

## 2021-01-06 LAB — CBC
Absolute NRBC: 0 10*3/uL (ref 0.00–0.00)
Absolute NRBC: 0 10*3/uL (ref 0.00–0.00)
Hematocrit: 31 % — ABNORMAL LOW (ref 34.7–43.7)
Hematocrit: 31.3 % — ABNORMAL LOW (ref 34.7–43.7)
Hgb: 9.5 g/dL — ABNORMAL LOW (ref 11.4–14.8)
Hgb: 9.9 g/dL — ABNORMAL LOW (ref 11.4–14.8)
MCH: 29.1 pg (ref 25.1–33.5)
MCH: 30 pg (ref 25.1–33.5)
MCHC: 30.6 g/dL — ABNORMAL LOW (ref 31.5–35.8)
MCHC: 31.6 g/dL (ref 31.5–35.8)
MCV: 94.8 fL (ref 78.0–96.0)
MCV: 94.8 fL (ref 78.0–96.0)
MPV: 13.6 fL — ABNORMAL HIGH (ref 8.9–12.5)
MPV: 13 fL — ABNORMAL HIGH (ref 8.9–12.5)
Nucleated RBC: 0 /100 WBC (ref 0.0–0.0)
Nucleated RBC: 0 /100 WBC (ref 0.0–0.0)
Platelets: 77 10*3/uL — ABNORMAL LOW (ref 142–346)
Platelets: 81 10*3/uL — ABNORMAL LOW (ref 142–346)
RBC: 3.27 10*6/uL — ABNORMAL LOW (ref 3.90–5.10)
RBC: 3.3 10*6/uL — ABNORMAL LOW (ref 3.90–5.10)
RDW: 13 % (ref 11–15)
RDW: 14 % (ref 11–15)
WBC: 3.39 10*3/uL (ref 3.10–9.50)
WBC: 3.61 10*3/uL (ref 3.10–9.50)

## 2021-01-06 LAB — URINALYSIS REFLEX TO MICROSCOPIC EXAM - REFLEX TO CULTURE
Bilirubin, UA: NEGATIVE
Blood, UA: NEGATIVE
Glucose, UA: 500 — AB
Ketones UA: NEGATIVE
Leukocyte Esterase, UA: NEGATIVE
Nitrite, UA: NEGATIVE
Protein, UR: >=500 — AB
Specific Gravity UA: 1.014 (ref 1.001–1.035)
Urine pH: 7 (ref 5.0–8.0)
Urobilinogen, UA: NEGATIVE mg/dL (ref 0.2–2.0)

## 2021-01-06 LAB — RENAL FUNCTION PANEL
Albumin: 2.3 g/dL — ABNORMAL LOW (ref 3.5–5.0)
Anion Gap: 10 (ref 5.0–15.0)
BUN: 40 mg/dL — ABNORMAL HIGH (ref 7.0–21.0)
CO2: 25 mEq/L (ref 17–29)
Calcium: 7.7 mg/dL — ABNORMAL LOW (ref 8.5–10.5)
Chloride: 103 mEq/L (ref 99–111)
Creatinine: 6.3 mg/dL — ABNORMAL HIGH (ref 0.4–1.0)
Glucose: 376 mg/dL — ABNORMAL HIGH (ref 70–100)
Phosphorus: 5.1 mg/dL — ABNORMAL HIGH (ref 2.3–4.7)
Potassium: 4.5 mEq/L (ref 3.5–5.3)
Sodium: 138 mEq/L (ref 135–145)

## 2021-01-06 LAB — GLUCOSE WHOLE BLOOD - POCT
Whole Blood Glucose POCT: 312 mg/dL — ABNORMAL HIGH (ref 70–100)
Whole Blood Glucose POCT: 208 mg/dL — ABNORMAL HIGH (ref 70–100)
Whole Blood Glucose POCT: 162 mg/dL — ABNORMAL HIGH (ref 70–100)
Whole Blood Glucose POCT: 276 mg/dL — ABNORMAL HIGH (ref 70–100)

## 2021-01-06 LAB — GFR: EGFR: 8

## 2021-01-06 LAB — PTH, INTACT: PTH Intact: 1004 pg/mL — ABNORMAL HIGH (ref 17.7–84.5)

## 2021-01-06 MED ORDER — SODIUM CHLORIDE 0.9 % IV BOLUS
100.0000 mL | INTRAVENOUS | Status: AC | PRN
Start: 2021-01-06 — End: 2021-01-06

## 2021-01-06 MED ORDER — INSULIN LISPRO 100 UNIT/ML SOLN (WRAP)
15.0000 [IU] | Freq: Three times a day (TID) | Status: DC
Start: 2021-01-07 — End: 2021-01-08
  Administered 2021-01-07 – 2021-01-08 (×3): 15 [IU] via SUBCUTANEOUS
  Filled 2021-01-06 (×3): qty 45

## 2021-01-06 MED ORDER — SODIUM CHLORIDE 0.9 % IV BOLUS
250.0000 mL | INTRAVENOUS | Status: AC | PRN
Start: 2021-01-06 — End: 2021-01-06

## 2021-01-06 NOTE — Progress Notes (Signed)
Uf OF  2.6 liters. Tolerated fair. Pt. Very sleepy, oriented x3. LAF worked well. Hemostasis achieved, no c/o pain. Report called to Lucia Gaskins RN   01/06/21 1812   Treatment Summary   Time Off Machine 1815   Duration of Treatment (Hours) 4   Treatment Type 2:1   Dialyzer Clearance Moderately streaked   Fluid Volume Off (mL) 3000   Prime Volume (mL) 200   Rinseback Volume (mL) 200   Fluid Given: Normal Saline (mL) 0   Fluid Given: PRBC  0 mL   Fluid Given: Albumin (mL) 0   Fluid Given: Other (mL) 0   Total Fluid Given 400   Hemodialysis Net Fluid Removed 2600   Post Treatment Assessment   Post-Treatment Weight (Kg) 95.6   Patient Response to Treatment TOLERATED WELL   Vitals   Temp 98.1 F (36.7 C)   Heart Rate 69   Resp Rate 18   BP 158/74   Assessment   Mental Status Lethargic;Oriented   Cardiac (WDL) WDL   Cardiac Regularity Regular   Cardiac Rhythm Normal Sinus Rhythm   Respiratory  WDL   Respiratory Pattern Regular;Easy   Bilateral Breath Sounds Clear;Diminished   Edema  X   RLE Edema Non Pitting Edema   LLE Edema Non Pitting Edema   General Skin Color Appropriate for ethnicity   Skin Condition/Temp Warm   Gastrointestinal (WDL) WDL   Abdomen Inspection Soft   GI Symptoms None   Mobility Ambulatory with Assistance   Pain Assessment   Charting Type Reassessment   Pain Scale Used Numeric Scale (0-10)   Numeric Pain Scale   Pain Score 0   POSS Score 2   Education   Person taught Patient   Knowledge basis Substantial   Topics taught Procedure   Teaching Tools Explain   Reponse Verbalizes Understanding   Bedside Nurse Communication   Name of bedside RN - post dialysis yolanda carpio rn

## 2021-01-06 NOTE — Plan of Care (Signed)
Problem: Renal Instability  Goal: Fluid and electrolyte balance are achieved/maintained  Flowsheets (Taken 01/06/2021 0650 by Shirleen Schirmer, RN)  Fluid and electrolyte balance are achieved/maintained:   Monitor intake and output every shift   Monitor/assess lab values and report abnormal values   Provide adequate hydration   Monitor daily weight   Assess for confusion/personality changes   Assess and reassess fluid and electrolyte status   Follow fluid restrictions/IV/PO parameters     Problem: Patient Receiving Advanced Renal Therapies  Goal: Therapy access site remains intact  Flowsheets (Taken 01/05/2021 1657)  Therapy access site remains intact: Assess therapy access site

## 2021-01-06 NOTE — Plan of Care (Signed)
Neuro: AO x 4 and follows commands  Resp:  Bilateral breath sounds clear; Encouraged IS use and deep breathing exercises. Tolerating RA.  CV: Regular, denies chest pain.  GI:  Active bowel sounds present. Tolerating renal diet. No emesis on this shift, no nausea present - abd soft, nontender.  GU: anuric - on HD  M/Activity: Active - in all extremities; standby assist. Turns and repositions self. No distress noted  Integ: WDL for ethnicity. Wound care done per wound care instructions   DVT Prophylaxis: Bilateral SCDs on LEs.  Safety: Moderate Falls Risk. Informed to call for assistance prior to getting up. Bed remains in lowest position and 3/4 side rails up.  Psychosocial: Calm and follows commands.   Shift Events: HD done  Plan of Care: US - breast and then [place] plan tomorrow     Pt resting comfortably in bed with no signs of distress or discomfort  Call light & personal belongings within reach, bed alarm on. Floor mat at bedside.   Problem: Safety  Goal: Patient will be free from injury during hospitalization  Outcome: Progressing  Goal: Patient will be free from infection during hospitalization  Outcome: Progressing     Problem: Discharge Barriers  Goal: Patient will be discharged home or other facility with appropriate resources  Outcome: Progressing     Problem: Psychosocial and Spiritual Needs  Goal: Demonstrates ability to cope with hospitalization/illness  Outcome: Progressing     Problem: Renal Instability  Goal: Fluid and electrolyte balance are achieved/maintained  Outcome: Progressing     Problem: Patient Receiving Advanced Renal Therapies  Goal: Therapy access site remains intact  Outcome: Progressing     Problem: Moderate/High Fall Risk Score >5  Goal: Patient will remain free of falls  Outcome: Progressing

## 2021-01-06 NOTE — UM Notes (Signed)
UTILIZATION REVIEW CONTACT: Name:  Milas Gain MSN RN CCM   Utilization Review Case Manager    [facility]  Address:  [address] , [place] ,[place] [address]  NPI:   [id number]  Tax ID:  [id number]  Phone: [phone]  Fax: [phone]  Email: [email or web address]        ER ADMIT DATE AND TIME: 01/05/2021  9:35 AM  OBS admit:  01/05/21 1418  Diagnosis: Fluid Overload   Level of Care: Intermediate Care   Patient Class: Observation         PATIENT NAME: [name],[name]  DOB: December 10, 1955      ADMISSION REVIEW   History of present illness: The pt has a  PMH of HTN, HLD, DM2, ESRD on HD MWF presenting to the ED for shortness of breath.  Patient states she has not been to dialysis since last Monday because she has not been feeling well due to body aches, abdominal pain nausea vomiting and diarrhea.  Reports chest pain, shortness of breath and leg swelling.  States she did not take [place] 35 units last night.      Complaints:    Chief Complaint   Patient presents with    Shortness of Breath    Diarrhea    Loss of appetite         VS:   Vitals:    01/06/21 1050   BP: 153/79   Pulse: 72   Resp: 18   Temp: 98.6 F (37 C)   SpO2: 92%          Recent Labs   Lab 01/06/21  0319 01/05/21  1018   Hgb 9.5* 11.6   Hematocrit 31.0* 37.8   Platelets 77* 105*      Recent Labs   Lab 01/06/21  0319 01/05/21  1018   BUN 40.0* 69.0*   Creatinine 6.3* 9.4*   Calcium 7.7* 8.4*   Albumin 2.3* 2.9*   Phosphorus 5.1* 7.0*   Glucose 376* 401*             Radiologic Studies -   CT Abd/ Pelvis without Contrast    Result Date: 01/05/2021  1. No evidence of ureteral stone or hydronephrosis. 2. Anasarca.  Elnita Maxwell, MD  01/05/2021 1:31 PM             XR Chest  AP Portable   Impression:   pulmonary edema             ED meds:   Date/Time Order Dose Route Action Action by    01/05/2021 1041 EST labetalol (NORMODYNE,TRANDATE) injection 20 mg 20 mg Intravenous Given    01/05/2021 1055 EST oxyCODONE-acetaminophen (PERCOCET) 5-325  MG per tablet 1 tablet 1 tablet Oral Given    01/05/2021 1240 EST ondansetron (ZOFRAN) injection 4 mg 4 mg Intravenous Given    01/05/2021 1240 EST insulin lispro injection 10 Units 10 Units Subcutaneous Given    01/05/2021 1508 EST amLODIPine (NORVASC) tablet 10 mg 10 mg Oral Given    01/05/2021 1509 EST aspirin chewable tablet 81 mg 81 mg Oral Given    01/05/2021 1506 EST sevelamer (RENVELA) tablet 800 mg 800 mg Oral Given    01/05/2021 1506 EST heparin (porcine) injection 5,000 Units 5,000 Units Subcutaneous Given       MD NOTES:  H&P  Patient Active Hospital Problem List:       Fluid overload (01/05/2021)        Leone Payor [place] is a 65 y.o. female  with a PMH of HTN, HLD, DM2, ESRD on HD MWF admitted with Fluid overload.        #Pulmonary edema  #ESRD on HD MWF  #Hypertensive urgency  -s/p IV labetalol in the ED with improvement.  Missed dialysis. Nephrology consulted.  Plan for HD today.    Continue home amlodipine and Coreg.  Monitor on telemetry     #Elevated troponin  #Chest pain and shortness of breath  -Likely secondary to pulmonary edema and hypertensive urgency.   -EKG reviewed, mild troponin elevation.   -Cardiology consulted  -Continue aspirin and statin  -Trend troponins  -Monitor on telemetry     #Abdominal pain  #Nausea vomiting diarrhea  -Likely viral etiology.  Abdomen benign on exam. Afebrile, no leukocytosis.  COVID-19 and influenza negative.  CXR with no infiltrate. CT abdomen pelvis shows anasarca otherwise no acute findings.    Followup UA.  As needed pain meds and antiemetics        #DM with hyperglycemia  -BG 401. s/p 10 units insulin lispro in the ED.  Lantus 35 units qhs sliding scale insulin.     #Left breast pain  -In the ED, patient reported left breast pain.  Per ED physician, left breast appears slightly larger than right but no induration, fluctuance or erythema.  Will need inpatient versus outpatient ultrasound for further evaluation.     #HLD  -Continue Lipitor      #Thrombocytopenia  -Appears chronic per chart review.  No evidence of bleeding.  Monitor.     BMI Assessment:  - Body mass index is 30.18 kg/m.  - Meets criteria for overweight given BMI between 25 and 30 due to excess calories/nutritional status. Weight loss and lifestyle modifications encouraged.     OTHER  Code Status: FULL code   DVT prophylaxis:   heparin sc       NEPHROLOGY NOTES  Assessment:      1. ESRD on HD MWF at [facility] under Dr Corky Mull.   - Missed HD x>1 week.   2. Hyperkalemia : better after dialysis  3. Metabolic acidosis 2/2 missed HD  4. Anemia of CKD  5. HTN emergency : better with volume removal  6. Volume Overload  7. Diarrhea and Vomiting.  Recommendations:    Dialysis again today   Should be stable from renal standpoint for D/C after dialysis today    Current Medications:    Scheduled Meds:  Current Facility-Administered Medications   Medication Dose Route Frequency    amLODIPine  10 mg Oral Daily    aspirin  81 mg Oral Daily    atorvastatin  40 mg Oral QHS    carvedilol  25 mg Oral Q12H SCH    gabapentin  300 mg Oral BID    [Held by provider] heparin (porcine)  5,000 Units Subcutaneous Q8H [place]    insulin glargine  35 Units Subcutaneous QHS    insulin lispro  1-8 Units Subcutaneous TID AC    sevelamer  800 mg Oral TID MEALS

## 2021-01-06 NOTE — Progress Notes (Signed)
Initial Case Management Assessment and Discharge [place]   Patient Name: [name], [name]   Date of Birth January 25, 1955   Attending Physician: Marya Landry, MD   Primary Care Physician: Pcp, None, MD   Length of Stay 1   Reason for Consult / Chief Complaint IDPA        Situation   Admission DX:   1. Fluid overload        A/O Status: X 3    LACE Score: 9    Patient admitted from: ER  Admission Status: observation    [place]: Self  Name: [name]  Phone number: [phone]       Background     Advanced directive:   <no information>    Code Status:   Full Code     Residence: Apartment    PCP: PCP None, MD  Patient Contact:   [phone] (home)     [phone] (mobile)     Emergency contact:   Extended Emergency Contact Information  Primary Emergency Contact: okoronwo,shannon  Mobile Phone: [phone]  Relation: Daughter  Interpreter needed? No  Secondary Emergency Contact: [place]  Mobile Phone: [phone]  Relation: Spouse      ADL/IADL's: Assistive Device  Previous Level of function: 6 Modified Independent     DME: Front wheeled walker   Single point cane    Pharmacy:     [id number] - [place], [place]  [place] [address]  Phone: [phone] Fax: [phone]      Prescription Coverage: Yes    Home Health: The patient is receiving home health services, including a home health aide.    Previous SNF/AR:     COVID Vaccine Status: Not vaccinated    Date First IMM given:   UAI on file?: No  Transport for discharge? Mode of transportation: Taxi  Agreeable to Home with family post-discharge:  Yes     Assessment   IDPA completed with patient at bedside.  Face sheet verified.  Pt reports PCP is Dr. Reesa Chew with [facility]. CM unable to locate in database.  OP HD cener is [facility] [address]  MWF 10am.  She has transportation to and from dialysis and a care aid at home 3-11 daily.  She will need transportation  assistance at time of discharge.   BARRIERS TO DISCHARGE: Medical Stability     Recommendation   D/C Plan A: Home with family    D/C Plan B: Home with home health         Marzetta Merino, RN  RN [facility]  [phone]

## 2021-01-06 NOTE — Plan of Care (Signed)
Pt. Had dialysis 01/05/21. 4 L were removed. Pt. SBP is 168 after dialysis. Pt. schedule for dialysis again today 01/06/21. Pt. Platelet count is 77.  Pt. Denies dizziness, bleeding in stool or urine. No signs of bleeding at IV site. Heparin held and MD notified.     Problem: Renal Instability  Goal: Fluid and electrolyte balance are achieved/maintained  Outcome: Progressing  Flowsheets (Taken 01/06/2021 0650)  Fluid and electrolyte balance are achieved/maintained:   Monitor intake and output every shift   Monitor/assess lab values and report abnormal values   Provide adequate hydration   Monitor daily weight   Assess for confusion/personality changes   Assess and reassess fluid and electrolyte status   Follow fluid restrictions/IV/PO parameters

## 2021-01-06 NOTE — Progress Notes (Signed)
Arrived via bed. Pt. Alert and oriented x3. Lungs clear. No sob noted. Generalized non pitting edema noted. Good bruit and thrill in LAF. Telemetry SR. No c/o pain. Report received from [place]   01/06/21 1404   Dialysis Weight   Pre-Treatment Weight (Kg) 98.2   Scale Type ICU Bed Scale   Vitals   Temp 98.1 F (36.7 C)   Heart Rate 74   Resp Rate 16   BP 140/72   SpO2 92 %   Assessment   Mental Status Alert;Oriented   Cardiac (WDL) WDL   Respiratory  WDL   Respiratory Pattern Regular;Easy   Bilateral Breath Sounds Clear;Diminished   Edema  X   Generalized Edema Non Pitting Edema   RLE Edema Non Pitting Edema   LLE Edema Non Pitting Edema   General Skin Color Appropriate for ethnicity   Skin Condition/Temp Warm   Gastrointestinal (WDL) WDL   Abdomen Inspection Soft;Rounded   GI Symptoms None   Mobility Bed   Pain Assessment   Charting Type Reassessment   Pain Scale Used Numeric Scale (0-10)   Numeric Pain Scale   Pain Score 0   POSS Score 1   Hemodialysis Comments   Pre-Hemodialysis Comments TIME OUT DONE

## 2021-01-06 NOTE — Progress Notes (Signed)
[facility]  PROGRESS NOTE      Patient: [name]  Date: 01/06/2021   LOS: 0 Days  Admission Date: 01/05/2021   MRN: [id number]  Attending: Marya Landry, MD  Please contact me on the following epic secure chat       ASSESSMENT/PLAN     [name] is a 65 y.o. female admitted with Fluid overload    Interval Summary:     Patient Active Hospital Problem List:     #Pulmonary edema  #Hypervolemia  #ESRD on HD MWF  #Hypertensive urgency  -Patient presented with shortness of breath secondary to pulmonary edema/hypervolemia from missing her hemodialysis.  Patient also found to have hypertensive urgency with blood pressure going up to 235/112    S/p IV labetalol in the ED with improvement.  Missed dialysis.    Status post nephrology evaluation and hemodialysis with ultrafiltration    Patient currently in hemodialysis again getting her second dialysis    Continue home amlodipine and Coreg.  Monitor on telemetry     #Elevated troponin  #Chest pain and shortness of breath  -Patient presenting with chest discomfort along with mild troponin elevation  -EKG reviewed, mild troponin elevation.   -Status post cardiology evaluation  -Patient's elevated troponin likely secondary to demand ischemia in the setting of hypertensive crisis, flash pulmonary edema and missed dialysis  -Continue aspirin and statin for presumed CAD  -Volume control with hemodialysis  -Monitor on telemetry     #Abdominal pain  #Nausea vomiting diarrhea  -Likely viral etiology.  Abdomen benign on exam. Afebrile, no leukocytosis.  COVID-19 and influenza negative.  CXR with no infiltrate. CT abdomen pelvis shows anasarca otherwise no acute findings.    Patient reports significant improvement in her symptoms  -Continue to monitor        #DM with hyperglycemia  -BG 401. s/p 10 units insulin lispro in the ED.  -Currently better controlled  -Hemoglobin A1c 11.8 on 10/21/2020  -Recheck A1c    -Continue with Lantus 35 units qhs, lispro 15 units  with meals and sliding scale insulin.     #Left breast pain  -In the ED, patient reported left breast pain.  Per ED physician, left breast appears slightly larger than right but no induration, fluctuance or erythema.  -We will get ultrasound of her right breast to rule out malignancy     #HLD  -Continue Lipitor     #Thrombocytopenia  -Appears chronic per chart review.  No evidence of bleeding.    -Monitor for now     BMI Assessment:  - Body mass index is 30.18 kg/m.  - Meets criteria for overweight given BMI between 25 and 30 due to excess calories/nutritional status. Weight loss and lifestyle modifications encouraged.                  Nutrition: Renal diet    DVT Prophylaxis: Renal Heparin        Patient has BMI=Body mass index is 30.18 kg/m.  Diagnosis: Obesity based on BMI criteria     Code Status: Full    Dispo: Likely discharge home in 1 to 2 days when medically cleared           [name] states that she feels very weak and lethargic currently getting hemodialysis    MEDICATIONS     Current Facility-Administered Medications   Medication Dose Route Frequency    amLODIPine  10 mg Oral Daily  aspirin  81 mg Oral Daily    atorvastatin  40 mg Oral QHS    carvedilol  25 mg Oral Q12H SCH    gabapentin  300 mg Oral BID    [Held by provider] heparin (porcine)  5,000 Units Subcutaneous Q8H [place]    insulin glargine  35 Units Subcutaneous QHS    insulin lispro  1-8 Units Subcutaneous TID AC    sevelamer  800 mg Oral TID MEALS       PHYSICAL EXAM     Vitals:    01/06/21 1850   BP: 148/77   Pulse: 73   Resp: 19   Temp: 98.1 F (36.7 C)   SpO2: 94%       Temperature: Temp  Min: 97.3 F (36.3 C)  Max: 98.6 F (37 C)  Pulse: Pulse  Min: 67  Max: 78  Respiratory: Resp  Min: 13  Max: 41  Non-Invasive BP: BP  Min: 125/65  Max: 198/84  Pulse Oximetry SpO2  Min: 91 %  Max: 100 %    Intake and Output Summary (Last 24 hours) at Date Time    Intake/Output Summary (Last 24 hours) at 01/06/2021 1944  Last  data filed at 01/06/2021 1812  Gross per 24 hour   Intake 100 ml   Output 6700 ml   Net -6600 ml         GEN APPEARANCE: Normal;  A&OX3, lethargic, seen during hemodialysis  HEENT: PERLA; EOMI; Conjunctiva Clear  NECK: Supple; No bruits  CVS: Bradycardic, S1, S2; No M/G/R  LUNGS: CTAB; No Wheezes; No Rhonchi: No rales  ABD: Soft; No TTP; + Normoactive BS  EXT: No edema; Pulses 2+ and intact, AV fistula noted on left arm  Skin exam:  no pallor  NEURO: CN 2-12 intact; No Focal neurological deficits  CAP REFILL:  Normal  MENTAL STATUS:  Normal      LABS     Recent Labs   Lab 01/06/21  1636 01/06/21  0319 01/05/21  1018   WBC 3.61 3.39 4.61   RBC 3.30* 3.27* 3.96   Hgb 9.9* 9.5* 11.6   Hematocrit 31.3* 31.0* 37.8   MCV 94.8 94.8 95.5   Platelets 81* 77* 105*       Recent Labs   Lab 01/06/21  0319 01/05/21  1018   Sodium 138 136   Potassium 4.5 4.9   Chloride 103 100   CO2 25 22   BUN 40.0* 69.0*   Creatinine 6.3* 9.4*   Glucose 376* 401*   Calcium 7.7* 8.4*   Magnesium  --  2.2       Recent Labs   Lab 01/06/21  0319 01/05/21  1018   ALT  --  28   AST (SGOT)  --  14   Bilirubin, Total  --  0.6   Albumin 2.3* 2.9*   Alkaline Phosphatase  --  188*       Recent Labs   Lab 01/05/21  1502 01/05/21  1121   hs Troponin-I 93.3* 97.8*   hs Troponin-I Delta -4.5  --              Microbiology Results (last 15 days)       Procedure Component Value Units Date/Time    COVID-19 (SARS-CoV-2) and Influenza A/B, NAA (Liat Rapid)- Admission [[id number]] Collected: 01/05/21 1040    Order Status: Completed Specimen: Culturette from Nasopharyngeal Updated: 01/05/21 1239     Purpose of COVID testing Diagnostic -[facility]  SARS-CoV-2 Specimen Source Nasal Swab     SARS CoV 2 Overall Result Not Detected     Comment: __________________________________________________  -A result of "Detected" indicates POSITIVE for the    presence of SARS CoV-2 RNA  -A result of "Not Detected" indicates NEGATIVE for the    presence of SARS CoV-2  RNA  __________________________________________________________  Test performed using the Roche cobas Liat SARS-CoV-2 assay. This assay is  only for use under the Food and Drug Administration's Emergency Use  Authorization. This is a real-time RT-PCR assay for the qualitative  detection of SARS-CoV-2 RNA. Viral nucleic acids may persist in vivo,  independent of viability. Detection of viral nucleic acid does not imply the  presence of infectious virus, or that virus nucleic acid is the cause of  clinical symptoms. Negative results do not preclude SARS-CoV-2 infection and  should not be used as the sole basis for diagnosis, treatment or other  patient management decisions. Negative results must be combined with  clinical observations, patient history, and/or epidemiological information.  Invalid results may be due to inhibiting substances in the specimen and  recollection should occur. Please see Fact Sheets for patients and providers  located:  https://www.benson-chung.com/          Influenza A Not Detected     Influenza B Not Detected     Comment: Test performed using the Roche cobas Liat SARS-CoV-2 & Influenza A/B assay.  This assay is only for use under the Food and Drug Administration's  Emergency Use Authorization. This is a multiplex real-time RT-PCR assay  intended for the simultaneous in vitro qualitative detection and  differentiation of SARS-CoV-2, influenza A, and influenza B virus RNA. Viral  nucleic acids may persist in vivo, independent of viability. Detection of  viral nucleic acid does not imply the presence of infectious virus, or that  virus nucleic acid is the cause of clinical symptoms. Negative results do  not preclude SARS-CoV-2, influenza A, and/or influenza B infection and  should not be used as the sole basis for diagnosis, treatment or other  patient management decisions. Negative results must be combined with  clinical observations, patient history, and/or epidemiological  information.  Invalid results may be due to inhibiting substances in the specimen and  recollection should occur. Please see Fact Sheets for patients and providers  located: http://olson-hall.info/.         Narrative:      o Collect and clearly label specimen type:  o PREFERRED-Upper respiratory specimen: One Nasal Swab in  Transport Media.  o Hand deliver to laboratory ASAP  Diagnostic -PUI             RADIOLOGY     CT Abd/ Pelvis without Contrast    Result Date: 01/05/2021  1. No evidence of ureteral stone or hydronephrosis. 2. Anasarca.  Elnita Maxwell, MD  01/05/2021 1:31 PM    XR Chest  AP Portable    Result Date: 01/05/2021   pulmonary edema Elyn Peers, MD  01/05/2021 10:46 AM      Signed,  Marya Landry, MD  7:44 PM 01/06/2021

## 2021-01-06 NOTE — Progress Notes (Signed)
[facility] PROGRESS NOTE  [id number]      Date Time: 01/06/21 5:32 AM  Patient Name: [name]  Attending Physician: Adrienne Mocha, MD    CC: follow-up ESRD, vol overload    Assessment:     1. ESRD on HD MWF at [facility] under Dr Corky Mull.   - Missed HD x>1 week.   2. Hyperkalemia : better after dialysis  3. Metabolic acidosis 2/2 missed HD  4. Anemia of CKD  5. HTN emergency : better with volume removal  6. Volume Overload  7. Diarrhea and Vomiting.    Recommendations:     Dialysis again today   Should be stable from renal standpoint for D/C after dialysis today      Case discussed with: RN      Armando Reichert, MD  [facility]  [phone] (office)      Subjective:     No new issues. Dialyzed  with 4L UF yesterday    Review of Systems:      No CP, abd pain    Physical Exam:     Vitals:    01/05/21 2128 01/05/21 2206 01/05/21 2300 01/06/21 0307   BP: 162/75 178/78 178/74 168/82   Pulse: 75 78 73 73   Resp: 22  21 20    Temp: 97.3 F (36.3 C)  97.4 F (36.3 C) 97.4 F (36.3 C)   TempSrc: Oral  Oral Oral   SpO2: 100%  100% 94%   Weight:    74.8 kg (165 lb)   Height:           Intake and Output Summary (Last 24 hours) at Date Time    Intake/Output Summary (Last 24 hours) at 01/06/2021 0532  Last data filed at 01/05/2021 2030  Gross per 24 hour   Intake --   Output 4100 ml   Net -4100 ml       General: awake, alert, oriented x 3, no acute distress  Cardiovascular: regular rate and rhythm, no murmurs, rubs or gallops  Lungs: clear to auscultation bilaterally, without wheezing, rhonchi, or rales  Abdomen: soft, non-tender, non-distended, normoactive bowel sounds  Extremities: no edema      Meds:      Scheduled Meds: PRN Meds:    amLODIPine, 10 mg, Oral, Daily  aspirin, 81 mg, Oral, Daily  atorvastatin, 40 mg, Oral, QHS  carvedilol, 25 mg, Oral, Q12H SCH  gabapentin, 300 mg, Oral, BID  heparin (porcine), 5,000 Units, Subcutaneous, Q8H SCH  insulin glargine, 35  Units, Subcutaneous, QHS  insulin lispro, 1-8 Units, Subcutaneous, TID AC  sevelamer, 800 mg, Oral, TID MEALS          Continuous Infusions:   acetaminophen, 650 mg, Q6H PRN  glucagon (rDNA), 1 mg, PRN   And  dextrose, 250 mL, PRN   And  dextrose, 25 g, PRN   And  dextrose, 25 g, PRN  glucagon (rDNA), 1 mg, PRN   And  dextrose, 250 mL, PRN   And  dextrose, 25 g, PRN   And  dextrose, 25 g, PRN  melatonin, 3 mg, QHS PRN  naloxone, 0.2 mg, PRN  ondansetron, 4 mg, Q6H PRN   Or  ondansetron, 4 mg, Q6H PRN              Labs:     Recent Labs   Lab 01/06/21  0319 01/05/21  1018   WBC 3.39 4.61   Hgb 9.5* 11.6   Hematocrit 31.0*  37.8   Platelets 77* 105*     Recent Labs   Lab 01/06/21  0319 01/05/21  1018   Sodium 138 136   Potassium 4.5 4.9   Chloride 103 100   CO2 25 22   BUN 40.0* 69.0*   Creatinine 6.3* 9.4*   Calcium 7.7* 8.4*   Albumin 2.3* 2.9*   Phosphorus 5.1* 7.0*   Magnesium  --  2.2   Glucose 376* 401*   EGFR 8.0 5.1       Recent Labs   Lab 01/06/21  0319   Urine Type Urine, Clean Ca   Color, UA Yellow   Clarity, UA Hazy   Specific Gravity UA 1.014   Urine pH 7.0   Nitrite, UA Negative   Ketones UA Negative   Urobilinogen, UA Negative   Bilirubin, UA Negative   Blood, UA Negative   RBC, UA 3 - 5   WBC, UA 0 - 5           Imaging personally reviewed, including: CT Abd/ Pelvis without Contrast    Result Date: 01/05/2021  1. No evidence of ureteral stone or hydronephrosis. 2. Anasarca.  Elnita Maxwell, MD  01/05/2021 1:31 PM    XR Chest  AP Portable    Result Date: 01/05/2021   pulmonary edema Elyn Peers, MD  01/05/2021 10:46 AM         Signed by: Armando Reichert, MD

## 2021-01-07 ENCOUNTER — Observation Stay: Payer: 59

## 2021-01-07 DIAGNOSIS — R778 Other specified abnormalities of plasma proteins: Secondary | ICD-10-CM | POA: Diagnosis present

## 2021-01-07 DIAGNOSIS — J811 Chronic pulmonary edema: Secondary | ICD-10-CM | POA: Diagnosis present

## 2021-01-07 DIAGNOSIS — R197 Diarrhea, unspecified: Secondary | ICD-10-CM | POA: Diagnosis present

## 2021-01-07 DIAGNOSIS — E8770 Fluid overload, unspecified: Secondary | ICD-10-CM | POA: Diagnosis present

## 2021-01-07 DIAGNOSIS — R112 Nausea with vomiting, unspecified: Secondary | ICD-10-CM | POA: Diagnosis present

## 2021-01-07 DIAGNOSIS — R55 Syncope and collapse: Secondary | ICD-10-CM | POA: Diagnosis present

## 2021-01-07 DIAGNOSIS — N644 Mastodynia: Secondary | ICD-10-CM | POA: Diagnosis present

## 2021-01-07 DIAGNOSIS — R079 Chest pain, unspecified: Secondary | ICD-10-CM | POA: Diagnosis present

## 2021-01-07 LAB — GLUCOSE WHOLE BLOOD - POCT
Whole Blood Glucose POCT: 174 mg/dL — ABNORMAL HIGH (ref 70–100)
Whole Blood Glucose POCT: 235 mg/dL — ABNORMAL HIGH (ref 70–100)
Whole Blood Glucose POCT: 50 mg/dL — CL (ref 70–100)
Whole Blood Glucose POCT: 80 mg/dL (ref 70–100)
Whole Blood Glucose POCT: 143 mg/dL — ABNORMAL HIGH (ref 70–100)

## 2021-01-07 LAB — RENAL FUNCTION PANEL
Albumin: 2.4 g/dL — ABNORMAL LOW (ref 3.5–5.0)
Anion Gap: 9 (ref 5.0–15.0)
BUN: 33 mg/dL — ABNORMAL HIGH (ref 7.0–21.0)
CO2: 27 mEq/L (ref 17–29)
Calcium: 8.1 mg/dL — ABNORMAL LOW (ref 8.5–10.5)
Chloride: 104 mEq/L (ref 99–111)
Creatinine: 4.5 mg/dL — ABNORMAL HIGH (ref 0.4–1.0)
Glucose: 325 mg/dL — ABNORMAL HIGH (ref 70–100)
Phosphorus: 3.4 mg/dL (ref 2.3–4.7)
Potassium: 4.3 mEq/L (ref 3.5–5.3)
Sodium: 140 mEq/L (ref 135–145)

## 2021-01-07 LAB — CBC
Absolute NRBC: 0 10*3/uL (ref 0.00–0.00)
Hematocrit: 33 % — ABNORMAL LOW (ref 34.7–43.7)
Hgb: 10.3 g/dL — ABNORMAL LOW (ref 11.4–14.8)
MCH: 30 pg (ref 25.1–33.5)
MCHC: 31.2 g/dL — ABNORMAL LOW (ref 31.5–35.8)
MCV: 96.2 fL — ABNORMAL HIGH (ref 78.0–96.0)
MPV: 12.7 fL — ABNORMAL HIGH (ref 8.9–12.5)
Nucleated RBC: 0 /100 WBC (ref 0.0–0.0)
Platelets: 75 10*3/uL — ABNORMAL LOW (ref 142–346)
RBC: 3.43 10*6/uL — ABNORMAL LOW (ref 3.90–5.10)
RDW: 13 % (ref 11–15)
WBC: 4.22 10*3/uL (ref 3.10–9.50)

## 2021-01-07 LAB — GFR: EGFR: 11.8

## 2021-01-07 LAB — HEMOGLOBIN A1C
Average Estimated Glucose: 289.1 mg/dL
Hemoglobin A1C: 11.7 % — ABNORMAL HIGH (ref 4.6–5.9)

## 2021-01-07 MED ORDER — AMOXICILLIN-POT CLAVULANATE 500-125 MG PO TABS
1.0000 | ORAL_TABLET | Freq: Every day | ORAL | Status: DC
Start: 2021-01-07 — End: 2021-01-12
  Administered 2021-01-07 – 2021-01-12 (×6): 1 via ORAL
  Filled 2021-01-07 (×7): qty 1

## 2021-01-07 MED ORDER — CEPHALEXIN 500 MG PO CAPS
500.0000 mg | ORAL_CAPSULE | Freq: Four times a day (QID) | ORAL | Status: DC
Start: 2021-01-07 — End: 2021-01-07

## 2021-01-07 MED ORDER — CEPHALEXIN 500 MG PO CAPS
500.0000 mg | ORAL_CAPSULE | Freq: Four times a day (QID) | ORAL | 0 refills | Status: DC
Start: 2021-01-07 — End: 2021-01-07

## 2021-01-07 MED ORDER — RISAQUAD PO CAPS
1.0000 | ORAL_CAPSULE | Freq: Two times a day (BID) | ORAL | Status: DC
Start: 2021-01-07 — End: 2021-01-12
  Administered 2021-01-07 – 2021-01-12 (×11): 1 via ORAL
  Filled 2021-01-07 (×11): qty 1

## 2021-01-07 MED ORDER — DOXYCYCLINE MONOHYDRATE 100 MG PO CAPS
100.0000 mg | ORAL_CAPSULE | Freq: Two times a day (BID) | ORAL | 0 refills | Status: AC
Start: 2021-01-07 — End: 2021-01-14

## 2021-01-07 MED ORDER — AMOXICILLIN-POT CLAVULANATE 500-125 MG PO TABS
1.0000 | ORAL_TABLET | Freq: Every day | ORAL | 0 refills | Status: AC
Start: 2021-01-07 — End: 2021-01-14

## 2021-01-07 MED ORDER — ACETAMINOPHEN 325 MG PO TABS
650.0000 mg | ORAL_TABLET | Freq: Four times a day (QID) | ORAL | Status: DC | PRN
Start: 2021-01-07 — End: 2021-02-04

## 2021-01-07 MED ORDER — RISAQUAD PO CAPS
1.0000 | ORAL_CAPSULE | Freq: Two times a day (BID) | ORAL | Status: DC
Start: 2021-01-07 — End: 2021-02-04

## 2021-01-07 MED ORDER — SODIUM CHLORIDE 0.9 % IV BOLUS
250.0000 mL | INTRAVENOUS | Status: AC | PRN
Start: 2021-01-07 — End: 2021-01-07

## 2021-01-07 MED ORDER — GABAPENTIN 600 MG PO TABS
600.0000 mg | ORAL_TABLET | Freq: Two times a day (BID) | ORAL | Status: DC
Start: 2021-01-07 — End: 2021-02-04

## 2021-01-07 MED ORDER — DOXYCYCLINE MONOHYDRATE 100 MG PO CAPS
100.0000 mg | ORAL_CAPSULE | Freq: Two times a day (BID) | ORAL | Status: DC
Start: 2021-01-07 — End: 2021-01-12
  Administered 2021-01-07 – 2021-01-12 (×10): 100 mg via ORAL
  Filled 2021-01-07 (×13): qty 1

## 2021-01-07 MED ORDER — SODIUM CHLORIDE 0.9 % IV BOLUS
100.0000 mL | INTRAVENOUS | Status: AC | PRN
Start: 2021-01-07 — End: 2021-01-07

## 2021-01-07 MED ORDER — ONDANSETRON 4 MG PO TBDP
4.0000 mg | ORAL_TABLET | Freq: Four times a day (QID) | ORAL | Status: DC | PRN
Start: 2021-01-07 — End: 2021-02-04

## 2021-01-07 NOTE — Discharge Summary (Signed)
[facility]      Patient: [name]  Admission Date: 01/05/2021   DOB: 03/03/1955  Discharge Date: 01/07/2021    MRN: [id number]  Discharge Attending:Kytzia Gienger Karmen Stabs, MD     Referring Physician: Merryl Hacker, None, MD  PCP: Pcp, None, MD       DISCHARGE SUMMARY     Discharge Information   Admission Diagnosis:   Pulmonary edema    Discharge Diagnosis:   Active Hospital Problems    Diagnosis    Pulmonary edema    Hypervolemia    Elevated troponin    Chest pain    Nausea vomiting and diarrhea    Breast pain, left    Syncope    Shortness of breath    Hypertensive urgency    Uncontrolled type 2 diabetes mellitus with hyperglycemia    Hyperlipidemia    Thrombocytopenia    Generalized abdominal pain    ESRD (end stage renal disease) on dialysis        Admission Condition: Guarded  Discharge Condition: Improving  Consultants: Nephrology, cardiology  Functional Status: Able to dangle at bedside with assistance  Discharged to: SNF    Discharge Medications:     Medication List        START taking these medications      acetaminophen 325 MG tablet  Commonly known as: TYLENOL  Take 2 tablets (650 mg) by mouth every 6 (six) hours as needed for Pain or Fever     ondansetron 4 MG disintegrating tablet  Commonly known as: ZOFRAN-ODT  Take 1 tablet (4 mg) by mouth every 6 (six) hours as needed for Nausea            CONTINUE taking these medications      albuterol sulfate HFA 108 (90 Base) MCG/ACT inhaler  Commonly known as: PROVENTIL     amLODIPine 10 MG tablet  Commonly known as: NORVASC  Take 1 tablet (10 mg total) by mouth daily     aspirin 81 MG chewable tablet     atorvastatin 40 MG tablet  Commonly known as: LIPITOR     carvedilol 25 MG tablet  Commonly known as: COREG     gabapentin 600 MG tablet  Commonly known as: NEURONTIN  Take 1 tablet (600 mg) by mouth 2 (two) times daily     insulin aspart 100 UNIT/ML injection  Commonly known as: NovoLOG     sevelamer 800 MG tablet  Commonly known as: RENVELA     Tresiba  FlexTouch 200 UNIT/ML Sopn  Generic drug: Insulin Degludec            STOP taking these medications      vitamin C 500 MG tablet  Commonly known as: ASCORBIC ACID               Where to Get Your Medications        Information about where to get these medications is not yet available    Ask your nurse or doctor about these medications  acetaminophen 325 MG tablet  gabapentin 600 MG tablet  ondansetron 4 MG disintegrating tablet             Hospital Course   Presentation History   As per admitting provider, "65 y.o. female with a PMH of HTN, HLD, DM2, ESRD on HD MWF presenting to the ED for shortness of breath.  Patient states she has not been to dialysis since last Monday because she has not been feeling well due  to body aches, abdominal pain nausea vomiting and diarrhea.  Reports chest pain, shortness of breath and leg swelling.  States she did not take [place] 35 units last night.  Denies syncope, fever, hematemesis, hematochezia, melena, dysuria, hematuria, focal weakness"    See HPI for details.    Hospital Course (0 Days)     #Pulmonary edema  #Hypervolemia  #ESRD on HD MWF  #Hypertensive urgency  -Patient presented with shortness of breath secondary to pulmonary edema/hypervolemia from missing her hemodialysis.  Patient also found to have hypertensive urgency with blood pressure going up to 235/112    S/p IV labetalol in the ED with improvement.  Missed dialysis.    Status post nephrology evaluation and hemodialysis with ultrafiltration on 01/05/2021, 01/06/2021 and 01/07/2021    Continue home amlodipine and Coreg.      Patient to continue her hemodialysis on Monday Wednesday Friday     #Elevated troponin  #Chest pain and shortness of breath  -Patient presenting with chest discomfort along with mild troponin elevation  -EKG reviewed, mild troponin elevation.   -Status post cardiology evaluation  -Patient's elevated troponin likely secondary to demand ischemia in the setting of hypertensive crisis, flash  pulmonary edema and missed dialysis  -Continue aspirin and statin for presumed CAD  -Volume control with hemodialysis       #Abdominal pain  #Nausea vomiting diarrhea  -Likely viral etiology.  Abdomen benign on exam. Afebrile, no leukocytosis.  COVID-19 and influenza negative.  CXR with no infiltrate. CT abdomen pelvis shows anasarca otherwise no acute findings.    Patient reported significant improvement in her symptoms after fluid removal  -Continue to monitor as an outpatient        #DM with hyperglycemia  -BG 401. s/p 10 units insulin lispro in the ED.  -Currently better controlled  -Hemoglobin A1c 11.8 on 10/21/2020  -Repeat A1c on 01/07/2021 was 11.7     -Continue with Tresiba 35 units qhs, Humalog 15 units with meals and adjust as tolerated    #Left breast pain  -In the ED, patient reported left breast pain.  Per ED physician, left breast appears slightly larger than right but no induration, fluctuance or erythema.  -Status post ultrasound of left breast showing subcutaneous edema at 3:00 region of left breast.  No sonographic evidence of obvious mass.  Patient did have prominent lymph node in left axilla with cortical thickening measuring 2.0 x 1.0 x 1.6 cm    Patient to follow-up with primary care physician as an outpatient for diagnostic mammogram    Patient's case discussed with infectious disease.  Patient with history of Acinetobacter complex CRO and MRSA wound in the past we will give Augmentin and doxycycline to cover for Acinetobacter and MRSA as per infectious's recommendation    #?  Syncope  -Patient had possible syncopal episode working with occupational therapy   -Continue with PT/OT     #HLD  -Continue Lipitor     #Thrombocytopenia  -Appears chronic per chart review.  No evidence of bleeding.    -Monitor for now     BMI Assessment:  - Body mass index is 30.18 kg/m.  - Meets criteria for overweight given BMI between 25 and 30 due to excess calories/nutritional status. Weight loss and lifestyle  modifications encouraged.       Procedures/Imaging:   CT Abd/ Pelvis without Contrast    Result Date: 01/05/2021  1. No evidence of ureteral stone or hydronephrosis. 2. Anasarca.  Elnita Maxwell, MD  01/05/2021  1:31 PM    XR Chest  AP Portable    Result Date: 01/05/2021   pulmonary edema Elyn Peers, MD  01/05/2021 10:46 AM    US Breast Left Ltd    Result Date: 01/07/2021   1. Subcutaneous edema and 3:00 region of left breast. No sonographic evidence of an obvious mass in the upper outer quadrant of left breast. 2. Prominent lymph node in left axilla with cortical thickening measuring 2.0 x 1.0 x 1.6 cm. 3. Diagnostic mammograms are recommended for a more complete evaluation. Next CATEGORY 0: INCOMPLETE - NEED ADDITIONAL IMAGING EVALUATION. WRITTEN RESULTS PROVIDED TO THE PATIENT PER MQSA REQUIREMENTS.   BIRAD: 89 Isabelle Course, MD  01/07/2021 1:13 PM         Best Practices   Was the patient admitted with either a CHF Exacerbation or Pneumonia? no     Progress Note/Physical Exam at Discharge     Subjective: Today patient reports that she feels okay currently denies any fever chills nausea vomiting diarrhea chest pain shortness of breath she does still complain of her left breast pain    Vitals:    01/07/21 1608 01/07/21 1615 01/07/21 1630 01/07/21 1645   BP: 145/68 148/72 157/71 163/72   Pulse: 62 65 65 65   Resp:       Temp:       TempSrc:       SpO2:       Weight:       Height:           GEN APPEARANCE: Normal;  A&OX3, lethargic, seen during hemodialysis  HEENT: PERLA; EOMI; Conjunctiva Clear  NECK: Supple; No bruits  CVS: Bradycardic, S1, S2; No M/G/R  LUNGS: CTAB; No Wheezes; No Rhonchi: No rales  ABD: Soft; No TTP; + Normoactive BS  EXT: No edema; Pulses 2+ and intact, AV fistula noted on left arm  Skin exam:  no pallor, erythema and mild swelling noted on left breast from 3:00 to 5:00  NEURO: CN 2-12 intact; No Focal neurological deficits  CAP REFILL:  Normal  MENTAL STATUS:  Normal       Diagnostics      Labs/Studies Pending at Discharge: No    Last Labs   Recent Labs   Lab 01/07/21  0317 01/06/21  1636 01/06/21  0319   WBC 4.22 3.61 3.39   RBC 3.43* 3.30* 3.27*   Hgb 10.3* 9.9* 9.5*   Hematocrit 33.0* 31.3* 31.0*   MCV 96.2* 94.8 94.8   Platelets 75* 81* 77*       Recent Labs   Lab 01/07/21  0317 01/06/21  0319 01/05/21  1018   Sodium 140 138 136   Potassium 4.3 4.5 4.9   Chloride 104 103 100   CO2 27 25 22    BUN 33.0* 40.0* 69.0*   Creatinine 4.5* 6.3* 9.4*   Glucose 325* 376* 401*   Calcium 8.1* 7.7* 8.4*   Magnesium  --   --  2.2       Microbiology Results (last 15 days)       Procedure Component Value Units Date/Time    COVID-19 (SARS-CoV-2) and Influenza A/B, NAA ([facility] Rapid)- Admission [[id number]] Collected: 01/05/21 1040    Order Status: Completed Specimen: Culturette from Nasopharyngeal Updated: 01/05/21 1239     Purpose of COVID testing Diagnostic -PUI     SARS-CoV-2 Specimen Source Nasal Swab     SARS CoV 2 Overall Result Not Detected     Comment: __________________________________________________  -  A result of "Detected" indicates POSITIVE for the    presence of SARS CoV-2 RNA  -A result of "Not Detected" indicates NEGATIVE for the    presence of SARS CoV-2 RNA  __________________________________________________________  Test performed using the Roche cobas Liat SARS-CoV-2 assay. This assay is  only for use under the Food and Drug Administration's Emergency Use  Authorization. This is a real-time RT-PCR assay for the qualitative  detection of SARS-CoV-2 RNA. Viral nucleic acids may persist in vivo,  independent of viability. Detection of viral nucleic acid does not imply the  presence of infectious virus, or that virus nucleic acid is the cause of  clinical symptoms. Negative results do not preclude SARS-CoV-2 infection and  should not be used as the sole basis for diagnosis, treatment or other  patient management decisions. Negative results must be combined with  clinical observations, patient  history, and/or epidemiological information.  Invalid results may be due to inhibiting substances in the specimen and  recollection should occur. Please see Fact Sheets for patients and providers  located:  https://www.benson-chung.com/          Influenza A Not Detected     Influenza B Not Detected     Comment: Test performed using the Roche cobas Liat SARS-CoV-2 & Influenza A/B assay.  This assay is only for use under the Food and Drug Administration's  Emergency Use Authorization. This is a multiplex real-time RT-PCR assay  intended for the simultaneous in vitro qualitative detection and  differentiation of SARS-CoV-2, influenza A, and influenza B virus RNA. Viral  nucleic acids may persist in vivo, independent of viability. Detection of  viral nucleic acid does not imply the presence of infectious virus, or that  virus nucleic acid is the cause of clinical symptoms. Negative results do  not preclude SARS-CoV-2, influenza A, and/or influenza B infection and  should not be used as the sole basis for diagnosis, treatment or other  patient management decisions. Negative results must be combined with  clinical observations, patient history, and/or epidemiological information.  Invalid results may be due to inhibiting substances in the specimen and  recollection should occur. Please see Fact Sheets for patients and providers  located: http://olson-hall.info/.         Narrative:      o Collect and clearly label specimen type:  o PREFERRED-Upper respiratory specimen: One Nasal Swab in  Transport Media.  o Hand deliver to laboratory ASAP  Diagnostic -PUI             Patient Instructions   Discharge Diet: Diabetic renal  Discharge Activity: As tolerated    Follow Up Appointment:   Follow-up Information       Pcp, None, MD .                              Time spent examining patient, discussing with patient/family regarding hospital course, chart review, reconciling medications and discharge  planning: > 35 minutes.    Signed,  Marya Landry, MD    5:10 PM 01/07/2021

## 2021-01-07 NOTE — UM Notes (Signed)
[facility] Utilization Review   NPI #[id number], Tax ID [id number]  Please call Milas Gain MSN RN CCM @  [phone]  with any questions or concerns.  Email:  [email or web address]  Fax final authorization and requests for additional information to [phone]    CONCURRENT REVIEW FOR: 01/07/21      PATIENT NAME: [name] / 22-Jan-1956 / AGE: 65 y.o.        V/S:    Vital Sign Min/Max (last 24 hours)    Value Min Max   Temp 97.7 F (36.5 C) 98.6 F (37 C)   Heart Rate 67 77   Resp Rate 12 27 Abnormal    BP: Systolic 528 413   BP: Diastolic 63 83   SpO2 90 % 98 %       Labs -  Lab 01/07/21  0317   Hgb 10.3*   Hematocrit 33.0*   Platelets 75*             Recent Labs   Lab 01/07/21  0317   BUN 33.0*   Creatinine 4.5*   Calcium 8.1*   Albumin 2.4*   Glucose 325*           Radiologic Studies -   US Breast Left Ltd    Result Date: 01/07/2021   1. Subcutaneous edema and 3:00 region of left breast. No sonographic evidence of an obvious mass in the upper outer quadrant of left breast. 2. Prominent lymph node in left axilla with cortical thickening measuring 2.0 x 1.0 x 1.6 cm. 3. Diagnostic mammograms are recommended for a more complete evaluation. Next CATEGORY 0: INCOMPLETE - NEED ADDITIONAL IMAGING EVALUATION. WRITTEN RESULTS PROVIDED TO THE PATIENT PER MQSA REQUIREMENTS.   BIRAD: 24 Isabelle Course, MD  01/07/2021 1:13 PM      MD NOTES:  PER NEPHROLOGY NOTES  Assessment:      1. ESRD on HD MWF at [facility] under Dr Corky Mull.   - Missed HD x>1 week.   2. Hyperkalemia due to missed HD; resolved   3. Metabolic acidosis 2/2 missed HD; resolved   4. Anemia of CKD  5. HTN emergency : resolved   6. Volume Overload: better  7. Diarrhea and Vomiting: resolved   Recommendations:      S/p HD Wednesday and Thursday  Plan for d/c today, we will dialyze her again today to continue OP HD schedule    Current Medications:    Scheduled Meds:  Current Facility-Administered Medications    Medication Dose Route Frequency    amLODIPine  10 mg Oral Daily    aspirin  81 mg Oral Daily    atorvastatin  40 mg Oral QHS    carvedilol  25 mg Oral Q12H SCH    gabapentin  300 mg Oral BID    [Held by provider] heparin (porcine)  5,000 Units Subcutaneous Q8H [place]    insulin glargine  35 Units Subcutaneous QHS    insulin lispro  15 Units Subcutaneous TID AC    sevelamer  800 mg Oral TID MEALS       DCP: SNF

## 2021-01-07 NOTE — PT Eval Note (Signed)
[facility]  Physical Therapy Attempt Note    Patient:  [name] MRN#:  [id number]  Unit:  [place] 28 INTERMEDIATE CARE Room/Bed:  C9507/K2575-Y      Physical Therapy evaluation attempted on 01/07/2021 at 7:15 PM unable to complete secondary to  per NSG just returned from dialysis and per NSG blood sugar low and request hold for  PT to today.    Physical therapy will follow up at a later time/date as able.     RN aware.    [place], [place], MSPT   X [phone]  01/07/2021 7:20 PM

## 2021-01-07 NOTE — Progress Notes (Signed)
UF of 2.4 lites off. Tolerated fair. Bs 50 at 29. Juice and crackers given. BS 80 at 1825.telemetry SR. LAF worked well. Hemostasis achieved. For possible discharge tonite. Report called to Lucia Gaskins RN   01/07/21 1825   Treatment Summary   Time Off Machine 1825   Duration of Treatment (Hours) 3.5   Treatment Type 2:1   Dialyzer Clearance Moderately streaked   Fluid Volume Off (mL) 2800   Prime Volume (mL) 200   Rinseback Volume (mL) 200   Fluid Given: Normal Saline (mL) 0   Fluid Given: PRBC  0 mL   Fluid Given: Albumin (mL) 0   Fluid Given: Other (mL) 0   Total Fluid Given 400   Hemodialysis Net Fluid Removed 2400   Post Treatment Assessment   Post-Treatment Weight (Kg) 94.4   Patient Response to Treatment TOLERATED FAIR   Vitals   Temp 97.9 F (36.6 C)   Heart Rate 60   Resp Rate 20   BP 137/66   O2 Device Nasal cannula   Assessment   Mental Status Lethargic;Oriented   Cardiac (WDL) WDL   Cardiac Regularity Regular   Cardiac Rhythm Normal Sinus Rhythm   Respiratory  WDL   Respiratory Pattern Regular;Easy   Bilateral Breath Sounds Clear;Diminished   Cough Strong   Edema  X   RLE Edema Non Pitting Edema   LLE Edema Non Pitting Edema   General Skin Color Appropriate for ethnicity   Skin Condition/Temp Warm   Gastrointestinal (WDL) WDL   Abdomen Inspection Soft;Rounded   GI Symptoms None   Mobility Ambulatory with Assistance   Pain Assessment   Charting Type Reassessment   Pain Scale Used Numeric Scale (0-10)   Numeric Pain Scale   Pain Score 0   POSS Score 2   Education   Person taught Patient   Knowledge basis Substantial   Topics taught Fluid Management;Procedure   Teaching Tools Explain   Reponse Verbalizes Understanding   Bedside Nurse Communication   Name of bedside RN - post dialysis Lucia Gaskins RN

## 2021-01-07 NOTE — OT Eval Note (Signed)
Occupational Therapy Eval [name]        Post Acute Care Therapy Recommendations   Discharge Recommendations:  SNF  D/C Milestones: Ambulate to bathroom, anticipate achievement in 2-3 sessions    If recommended discharge disposition is not available, patient will require the following assistance: [place] aide, Supervision, Assist with OOB activity, Assist with I/ADLs, and HH therapy    DME needs IF patient is discharging home: No additional equipment/DME recommended at this time    Therapy discharge recommendations may change with patient status.  Please refer to most recent note for up-to-date recommendations.    Is PT evaluation indicated at this time? Yes, an acute care PT evaluation is indicated at this time and should be performed prior to hospital discharge to assist with DME recommendations and/or D/C placement recommendations.    Unit: [place] 28 INTERMEDIATE CARE  Bed: F7902/I0973-Z      ___________________________________________________    Time of Evaluation:  Time Calculation  OT Received On: 01/07/21  Start Time: 1042  Stop Time: 1111  Time Calculation (min): 29 min    Chart Review and Collaboration with Care Team: 5 minutes, not included in above time.    OT Visit Number: 1    Consult received for [name] for OT Evaluation and Treatment.  Patient's medical condition is appropriate for Occupational therapy intervention at this time.      Activity Orders:  OT eval and treat and progressive mobility protocol    Precautions and Contraindications:  Precautions  Weight Bearing Status: no restrictions  Fall Risks: High  Other Precautions: contact    Personal Protective Equipment (PPE)  gloves, procedure gown, and procedure mask    Medical Diagnosis:  Fluid overload [E87.70]    History of Present Illness: CIEARRA RUFO is a 65 y.o. female admitted on 01/05/2021 with for shortness of breath.  Patient states she has not been to dialysis since last Monday because she has not been  feeling well due to body aches, abdominal pain nausea vomiting and diarrhea.    Patient Active Problem List   Diagnosis    Iron deficiency anemia secondary to inadequate dietary iron intake    Sideropenic dysphagia    Peritoneal dialysis catheter dysfunction, initial encounter    Peritonitis    Upper GI bleed    ESRD (end stage renal disease) on dialysis    Generalized abdominal pain    Thrombocytopenia    h/o orthostatic hypotension    Type 2 diabetes mellitus, with long-term current use of insulin    GERD (gastroesophageal reflux disease)    Bowel incontinence    Right sided numbness    Uncontrolled type 2 diabetes mellitus with hyperglycemia    Hypertension    Hyperlipidemia    History of COPD    Gastritis with hemorrhage    History of anemia due to chronic kidney disease    Open wound, abdominal wall, lateral, subsequent encounter    Gout    Dizziness    Fall    History of CVA (cerebrovascular accident)    Pneumonia due to COVID-19 virus    Diarrhea    Hypertensive urgency    Shortness of breath    ESRD (end stage renal disease)    Hyperkalemia    Fluid overload       Past Medical/Surgical History:  Past Medical History:   Diagnosis Date    Chronic obstructive pulmonary disease     Diabetes mellitus     Hyperlipidemia  Hypertension     Sleep apnea 2018      Past Surgical History:   Procedure Laterality Date    EGD, BIOPSY N/A 05/14/2019    Procedure: EGD, BIOPSY;  Surgeon: Ronie Spies, MD;  Location: [facility];  Service: Gastroenterology;  Laterality: N/A;    EXPLORATORY LAPAROTOMY N/A 05/27/2019    Procedure: EXPLORATORY LAPAROTOMY;  Surgeon: Herbert Moors., MD;  Location: [facility];  Service: General;  Laterality: N/A;    JOINT REPLACEMENT      shoulder    JOINT REPLACEMENT      knee    OVARY SURGERY Left     Removal    REMOVAL, FOREIGN BODY N/A 05/27/2019    Procedure: REMOVAL, PERITONEAL DIALYSIS CATHETER;  Surgeon: Herbert Moors., MD;  Location: [facility];  Service: General;   Laterality: N/A;    TUNNELED CATH CHECK/CHANGE (PERMCATH) N/A 07/03/2019    Procedure: TUNNELED CATH CHECK/CHANGE;  Surgeon: Maureen Ralphs, MD;  Location: [facility];  Service: Interventional Radiology;  Laterality: N/A;    TUNNELED CATH PLACEMENT (PERMCATH) Bilateral 05/23/2019    Procedure: TUNNELED CATH PLACEMENT;  Surgeon: Jacques Earthly, MD;  Location: [facility];  Service: Interventional Radiology;  Laterality: Bilateral;    TUNNELED CATH PLACEMENT (PERMCATH) Right 07/31/2019    Procedure: TUNNELED CATH PLACEMENT;  Surgeon: Jacques Earthly, MD;  Location: [facility];  Service: Interventional Radiology;  Laterality: Right;         X-Rays/Tests/Labs:  Lab Results   Component Value Date/Time    HGB 10.3 (L) 01/07/2021 03:17 AM    HCT 33.0 (L) 01/07/2021 03:17 AM    K 4.3 01/07/2021 03:17 AM    NA 140 01/07/2021 03:17 AM    INR 1.1 07/01/2019 10:41 PM    TROPI 93.3 (A) 01/05/2021 03:02 PM    TROPI -4.5 01/05/2021 03:02 PM    TROPI 97.8 (A) 01/05/2021 11:21 AM    TROPI 0.08 (H) 11/12/2020 04:40 AM    TROPI 0.35 ([place]) 10/21/2020 03:53 AM    TROPI 0.42 ([place]) 10/20/2020 11:57 AM    TROPI 0.02 01/21/2020 03:19 AM    TROPI 0.01 03/18/2006 06:10 PM       All imaging reviewed, please see chart for details.    Social History:  Prior Level of Function  Prior level of function: Independent with ADLs, Ambulates with assistive device  Assistive Device: Single point cane  Baseline Activity Level: Household ambulation  Driving: does not drive  Dressing - Upper Body: independent  Dressing - Lower Body: maximal assist  Cooking: light meal prep  Feeding: independent  Bathing: maximal assist  Grooming: independent  Toileting: minimal assist  DME Currently at Home: ADL- Education officer, environmental, ADL- Sock Aid, ADL- Civil engineer, contracting, [place], Single Point, Environmental consultant, [employer] (Location manager)    Home Living Arrangements  Living Arrangements: Children  Type of Home: Apartment  Home Layout: One level, Marketing executive Shower/Tub: Education officer, museum: Raised  Bathroom Equipment: Grab bars in shower, Civil engineer, contracting, Grab bars around toilet, Toilet riser  DME Currently at Home: ADL- Education officer, environmental, ADL- Sock Aid, ADL- Civil engineer, contracting, [place], [place], [place], [employer] (Location manager)  Home Living - Notes / Comments: resides with son; has airds come 3-11pm to assist with some ADLs and IADLs, otherwise son is home with pt most of time    Subjective:   "I feel like i'm going to fall down."   Patient is agreeable to  participation in the therapy session. Nursing clears patient for therapy.     Pain Assessment  Pain Assessment: Numeric Scale (0-10)  Pain Score: 8-severe pain  Pain Intervention(s): Repositioned, Elevated, Rest, Therapeutic presence      Objective:        Observation of Patient/Vital Signs: Stable    Cognitive Status and Neuro Exam:  Cognition/Neuro Status  Arousal/Alertness: Delayed responses to stimuli  Attention Span: Appears intact  Orientation Level: Oriented X4  Memory: Appears intact  Following Commands: independent  Safety Awareness: moderate verbal instruction  Insights: Educated in safety awareness, Decreased awareness of deficits  Problem Solving: Assistance required to generate solutions, Assistance required to implement solutions  Behavior: calm, cooperative, attentive  Motor Planning: intact  Coordination: intact    Musculoskeletal Examination  Gross ROM  Right Upper Extremity ROM: within functional limits  Left Upper Extremity ROM: needs focused assessment  Left Upper Extremity Overall ROM % reduced: reduced by 50%  Gross Strength  Right Upper Extremity Strength: within functional limits  Left Upper Extremity Strength: 3-/5       Activities of Daily Living  Self-care and Home Management  LB Dressing: Maximal Assist, edge of bed, Don/doff L sock, Don/doff R sock    Functional Mobility:  Mobility and Transfers  Scooting to EOB: Supervision  Supine to Sit: Contact Guard Assist  Sit to Supine: Contact Guard Assist  Sit to Stand: Herbalist Sitting Balance: Contact Guard Assist    Participation and Activity Tolerance  Participation and Endurance  Participation Effort: good  Endurance: Tolerates < 10 min exercise, no significant change in vital signs    Educated the Patient to role of occupational therapy, plan of care, goals of therapy and safety with mobility and ADLs with limited indication of understanding.  Additional education required..    Patient left in bed with alarm and all other medical equipment in place and call bell and all personal items/needs within reach.  RN notified of session outcome. Notified CM team and attending of d/c recommendation via secure chat.      Assessment:  AZHIA SIEFKEN is a 65 y.o. female admitted 01/05/2021. OT Assessment: decreased strength;decreased ROM;decreased independence with ADLs;balance deficits;decreased safety awareness;decreased independence with IADLs;decreased endurance/activity tolerance    Limited eval due to pt's episode of syncope during session.  Pt's bed mobility and sit<>stand from EOB requiring CGA.  Upon standing pt stating feels lightheaded and states she is going to fall down, therapist immediately placed pt in supine with feet elevated, all vital signs stable.  RN called and checked blood sugar 235.  Pt had episodes of arousal and would respond to name, however appeared to be out of baseline.  Pt's face appeared red and hot.  RN aware.  Pt would benefit from further evaluation.    Rehabilitation Potential:  Prognosis: Good    Plan:  OT Frequency Recommended: 2-3x/wk   Treatment Interventions: ADL retraining, Functional transfer training, Endurance training, UE strengthening/ROM, Continued evaluation     PMP Activity: Step 4 - Dangle at Bedside  Distance Walked (ft) (Step 6,7): 0 Feet      Risks/benefits/POC discussed    Goals:  Time For Goal Achievement: by time of discharge  ADL Goals  Patient will toilet: Minimal Assist  Other Goal: Pt will  sit EOB to prepare for OOB ADL tasks with SPV  Mobility and Transfer Goals  Pt will perform functional transfers: Contact Guard Assist,  with rolling walker                           Abe People, OTR/L    Physical Medicine and [facility]    01/07/2021  11:25 AM      [facility]  Patient: HAZELYN KALLEN MRN#: [id number]   Unit: [place] 28 INTERMEDIATE CARE Bed: V5331/J4099-Y

## 2021-01-07 NOTE — Progress Notes (Addendum)
Recommendation for SNF discussed with pt at the bedside.  Her preference for facility is close to home address. Referrals sent to facilities with in-house dialysis, [facility] and [facility].  Call from liaison, Glynis Smiles will verify benefits and pt will need insurance auth. Still need pt choice after referral.    Liaison replied that she saw where pt had syncopal episode during OT eval.  Will need updated OT eval and PT eval for auth.     CM will continue following and planning with pt for discharge.    Marzetta Merino, RN  RN Case Manager [facility]  [phone]

## 2021-01-07 NOTE — Plan of Care (Signed)
Neuro: AO x 4 and follows commands  Resp:  Bilateral breath sounds clear; Encouraged IS use and deep breathing exercises. On 2L NC.   CV: Regular, denies chest pain.  GI:  Active bowel sounds present. Tolerating renal/cardio diet. No emesis on this shift, no nausea present - abd soft, nontender.  GU: on HD  M/Activity: Active - in all extremities; standby assist. Turns and repositions self. No distress noted  Integ: WDL for ethnicity.   DVT Prophylaxis: Bilateral SCDs on LEs.  Safety: Moderate Falls Risk. Informed to call for assistance prior to getting up. Bed remains in lowest position and 3/4 side rails up.  Psychosocial: Calm and follows commands.   Shift Events: [place] chest/breast, HD, PT/OT done.  MDR: meds eval - changes to insulin dose. Per HD nurse report, During HD BS dropped to 50 and then 80.  Per OT report/note, During OT eval - Pt's condition changed. Assessment done - Pt sleeping in between conversation and VS monitoring - Pt report she feels sleepy and she mentioned of using CPAP weeks before admission. Dr Mallie Darting need to be notified about the setting in order to place an order for CPAP. Called family and left a message and also asked the Pt to call other member of the family who can provide the info. Report given to the night shift nurse     Pt resting comfortably in bed with no signs of distress or discomfort  Call light & personal belongings within reach, bed alarm on. Floor mat at bedside.     Problem: Safety  Goal: Patient will be free from injury during hospitalization  Outcome: Progressing  Goal: Patient will be free from infection during hospitalization  Outcome: Progressing     Problem: Renal Instability  Goal: Fluid and electrolyte balance are achieved/maintained  Outcome: Progressing     Problem: Patient Receiving Advanced Renal Therapies  Goal: Therapy access site remains intact  Outcome: Progressing     Problem: Moderate/High Fall Risk Score >5  Goal: Patient will remain free of  falls  Outcome: Progressing     Problem: Discharge Barriers  Goal: Patient will be discharged home or other facility with appropriate resources  Outcome: Progressing     Problem: Psychosocial and Spiritual Needs  Goal: Demonstrates ability to cope with hospitalization/illness  Outcome: Progressing

## 2021-01-07 NOTE — PT Eval Note (Signed)
[facility]  Physical Therapy Attempt Note    Patient:  [name] MRN#:  [id number]  Unit:  [place] 28 INTERMEDIATE CARE Room/Bed:  Y6948/N4627-O      Physical Therapy evaluation attempted on 01/07/2021 at 2:51 PM unable to complete secondary to patient off the floor for dialysis.  Physical therapy will follow up at a later time/date as able.     Bobetta Lime PT DPT, CLT  T-F 9:30AM-4:00PM  [phone]  01/07/2021 2:51 PM

## 2021-01-07 NOTE — Plan of Care (Addendum)
Pt. Is awake and oriented. Denies any pain. Desaturated to 83-85 while sleeping. History of sleep apnea. Given pt. 2L NC. O2 sats improved. Weening back down to RA. Will continue to monitor. Pt. Labs showed low platelet count. Heparin held d/t abnormal labs. Plans to discharge pt. today after ultrasound of breast.     Problem: Safety  Goal: Patient will be free from injury during hospitalization  Outcome: Progressing  Flowsheets (Taken 01/07/2021 0607)  Patient will be free from injury during hospitalization:   Assess patient's risk for falls and implement fall prevention plan of care per policy   Provide and maintain safe environment   Use appropriate transfer methods   Ensure appropriate safety devices are available at the bedside   Hourly rounding     Problem: Renal Instability  Goal: Fluid and electrolyte balance are achieved/maintained  Outcome: Progressing  Flowsheets (Taken 01/06/2021 0650)  Fluid and electrolyte balance are achieved/maintained:   Monitor intake and output every shift   Monitor/assess lab values and report abnormal values   Provide adequate hydration   Monitor daily weight   Assess for confusion/personality changes   Assess and reassess fluid and electrolyte status   Follow fluid restrictions/IV/PO parameters     Problem: Patient Receiving Advanced Renal Therapies  Goal: Therapy access site remains intact  Outcome: Progressing  Flowsheets (Taken 01/07/2021 0610)  Therapy access site remains intact:   Assess therapy access site   Change therapy access site dressing as needed

## 2021-01-07 NOTE — Plan of Care (Signed)
Problem: Renal Instability  Goal: Fluid and electrolyte balance are achieved/maintained  Flowsheets (Taken 01/06/2021 0650 by Shirleen Schirmer, RN)  Fluid and electrolyte balance are achieved/maintained:   Monitor intake and output every shift   Monitor/assess lab values and report abnormal values   Provide adequate hydration   Monitor daily weight   Assess for confusion/personality changes   Assess and reassess fluid and electrolyte status   Follow fluid restrictions/IV/PO parameters     Problem: Patient Receiving Advanced Renal Therapies  Goal: Therapy access site remains intact  Flowsheets (Taken 01/07/2021 0610 by Shirleen Schirmer, RN)  Therapy access site remains intact:   Assess therapy access site   Change therapy access site dressing as needed

## 2021-01-07 NOTE — Progress Notes (Signed)
Arrived via bed. Pt. Alert and oriented x3. Lungs clear. No sob noted. O2 at 2 liters nc on. Pt. With generalized non pitting edema. Good bruit and thrill in LAF. No C/O pain. Telemetry SR. No C/o pain at present. Report received from [facility]   01/07/21 1439   Dialysis Weight   Pre-Treatment Weight (Kg) 96.8   Scale Type ICU Bed Scale   Vitals   Temp 97.9 F (36.6 C)   Heart Rate 67   Resp Rate 16   BP 143/67   O2 Device Nasal cannula   Assessment   Mental Status Alert;Oriented   Cardiac (WDL) WDL   Cardiac Regularity Regular   Cardiac Rhythm Normal Sinus Rhythm   Respiratory  WDL   Respiratory Pattern Regular;Easy   Bilateral Breath Sounds Clear;Diminished   Edema  X   Generalized Edema Non Pitting Edema   RLE Edema Non Pitting Edema   LLE Edema Non Pitting Edema   General Skin Color Appropriate for ethnicity   Skin Condition/Temp Warm   Gastrointestinal (WDL) WDL   Abdomen Inspection Soft;Rounded   GI Symptoms None   Mobility Ambulatory with Assistance   Pain Assessment   Charting Type Assessment   Pain Scale Used Numeric Scale (0-10)   Numeric Pain Scale   Pain Score 0   POSS Score 1   Hemodialysis Comments   Pre-Hemodialysis Comments TIME OUT DONE

## 2021-01-07 NOTE — Progress Notes (Signed)
[facility] PROGRESS NOTE  703-KIDNEYS      Date Time: 01/07/21 11:49 AM  Patient Name: [name]  Attending Physician: Marya Landry, MD    CC: follow-up ESRD, vol overload    Assessment:     1. ESRD on HD MWF at [facility] under Dr Corky Mull.   - Missed HD x>1 week.   2. Hyperkalemia due to missed HD; resolved   3. Metabolic acidosis 2/2 missed HD; resolved   4. Anemia of CKD  5. HTN emergency : resolved   6. Volume Overload: better  7. Diarrhea and Vomiting: resolved     Recommendations:     S/p HD Wednesday and Thursday  Plan for d/c today, we will dialyze her again today to continue OP HD schedule      Case discussed with: patient, Dr. Mallie Darting, HD RN      Verl Blalock, MD  [facility]  [id number] (office)      Subjective:     Doing well   Review of Systems:      No CP, abd pain    Physical Exam:     Vitals:    01/07/21 0800 01/07/21 0957 01/07/21 0958 01/07/21 1140   BP:  135/83  132/68   Pulse: 69  70 69   Resp: (!) 27   18   Temp:    97.7 F (36.5 C)   TempSrc:    Oral   SpO2: 92%   93%   Weight:       Height:           Intake and Output Summary (Last 24 hours) at Date Time    Intake/Output Summary (Last 24 hours) at 01/07/2021 1149  Last data filed at 01/07/2021 0800  Gross per 24 hour   Intake 200 ml   Output 2600 ml   Net -2400 ml         General: awake, alert, oriented x 3, no acute distress  Cardiovascular: regular rate and rhythm  Lungs: clear to auscultation bilaterally, bilateral air entry, normal work of breathing  Abdomen: soft  Extremities: no edema, LUE AVF      Meds:      Scheduled Meds: PRN Meds:    amLODIPine, 10 mg, Oral, Daily  aspirin, 81 mg, Oral, Daily  atorvastatin, 40 mg, Oral, QHS  carvedilol, 25 mg, Oral, Q12H SCH  gabapentin, 300 mg, Oral, BID  [Held by provider] heparin (porcine), 5,000 Units, Subcutaneous, Q8H SCH  insulin glargine, 35 Units, Subcutaneous, QHS  insulin lispro, 15 Units, Subcutaneous, TID AC  sevelamer, 800  mg, Oral, TID MEALS        Continuous Infusions:   acetaminophen, 650 mg, Q6H PRN  glucagon (rDNA), 1 mg, PRN   And  dextrose, 250 mL, PRN   And  dextrose, 25 g, PRN   And  dextrose, 25 g, PRN  glucagon (rDNA), 1 mg, PRN   And  dextrose, 250 mL, PRN   And  dextrose, 25 g, PRN   And  dextrose, 25 g, PRN  melatonin, 3 mg, QHS PRN  naloxone, 0.2 mg, PRN  ondansetron, 4 mg, Q6H PRN   Or  ondansetron, 4 mg, Q6H PRN            Labs:     Recent Labs   Lab 01/07/21  0317 01/06/21  1636 01/06/21  0319   WBC 4.22 3.61 3.39   Hgb 10.3* 9.9* 9.5*   Hematocrit  33.0* 31.3* 31.0*   Platelets 75* 81* 77*       Recent Labs   Lab 01/07/21  0317 01/06/21  0319 01/05/21  1018   Sodium 140 138 136   Potassium 4.3 4.5 4.9   Chloride 104 103 100   CO2 27 25 22    BUN 33.0* 40.0* 69.0*   Creatinine 4.5* 6.3* 9.4*   Calcium 8.1* 7.7* 8.4*   Albumin 2.4* 2.3* 2.9*   Phosphorus 3.4 5.1* 7.0*   Magnesium  --   --  2.2   Glucose 325* 376* 401*   EGFR 11.8 8.0 5.1         Recent Labs   Lab 01/06/21  0319   Urine Type Urine, Clean Ca   Color, UA Yellow   Clarity, UA Hazy   Specific Gravity UA 1.014   Urine pH 7.0   Nitrite, UA Negative   Ketones UA Negative   Urobilinogen, UA Negative   Bilirubin, UA Negative   Blood, UA Negative   RBC, UA 3 - 5   WBC, UA 0 - 5           Signed by: Verl Blalock, MD

## 2021-01-08 LAB — CBC
Absolute NRBC: 0 10*3/uL (ref 0.00–0.00)
Hematocrit: 35.9 % (ref 34.7–43.7)
Hgb: 11 g/dL — ABNORMAL LOW (ref 11.4–14.8)
MCH: 30.3 pg (ref 25.1–33.5)
MCHC: 30.6 g/dL — ABNORMAL LOW (ref 31.5–35.8)
MCV: 98.9 fL — ABNORMAL HIGH (ref 78.0–96.0)
MPV: 13 fL — ABNORMAL HIGH (ref 8.9–12.5)
Nucleated RBC: 0 /100 WBC (ref 0.0–0.0)
Platelets: 76 10*3/uL — ABNORMAL LOW (ref 142–346)
RBC: 3.63 10*6/uL — ABNORMAL LOW (ref 3.90–5.10)
RDW: 13 % (ref 11–15)
WBC: 4.62 10*3/uL (ref 3.10–9.50)

## 2021-01-08 LAB — RENAL FUNCTION PANEL
Albumin: 2.4 g/dL — ABNORMAL LOW (ref 3.5–5.0)
Anion Gap: 8 (ref 5.0–15.0)
BUN: 33 mg/dL — ABNORMAL HIGH (ref 7.0–21.0)
CO2: 29 mEq/L (ref 17–29)
Calcium: 8.2 mg/dL — ABNORMAL LOW (ref 8.5–10.5)
Chloride: 101 mEq/L (ref 99–111)
Creatinine: 4 mg/dL — ABNORMAL HIGH (ref 0.4–1.0)
Glucose: 443 mg/dL — ABNORMAL HIGH (ref 70–100)
Phosphorus: 3.1 mg/dL (ref 2.3–4.7)
Potassium: 4.6 mEq/L (ref 3.5–5.3)
Sodium: 138 mEq/L (ref 135–145)

## 2021-01-08 LAB — GLUCOSE WHOLE BLOOD - POCT
Whole Blood Glucose POCT: 303 mg/dL — ABNORMAL HIGH (ref 70–100)
Whole Blood Glucose POCT: 147 mg/dL — ABNORMAL HIGH (ref 70–100)
Whole Blood Glucose POCT: 169 mg/dL — ABNORMAL HIGH (ref 70–100)
Whole Blood Glucose POCT: 195 mg/dL — ABNORMAL HIGH (ref 70–100)

## 2021-01-08 LAB — GFR: EGFR: 13.6

## 2021-01-08 MED ORDER — INSULIN GLARGINE 100 UNIT/ML SC SOLN
40.0000 [IU] | Freq: Every evening | SUBCUTANEOUS | Status: DC
Start: 2021-01-08 — End: 2021-01-09
  Administered 2021-01-08: 40 [IU] via SUBCUTANEOUS
  Filled 2021-01-08: qty 40

## 2021-01-08 MED ORDER — ALBUMIN HUMAN 25 % IV SOLN
100.0000 mL | INTRAVENOUS | Status: AC | PRN
Start: 2021-01-08 — End: 2021-01-08

## 2021-01-08 MED ORDER — INSULIN ASPART 100 UNIT/ML SC SOCT
10.0000 [IU] | Freq: Three times a day (TID) | SUBCUTANEOUS | Status: DC
Start: 2021-01-08 — End: 2021-02-04

## 2021-01-08 MED ORDER — TRESIBA FLEXTOUCH 200 UNIT/ML SC SOPN
40.0000 [IU] | PEN_INJECTOR | Freq: Every evening | SUBCUTANEOUS | Status: DC
Start: 2021-01-08 — End: 2021-01-09

## 2021-01-08 MED ORDER — SODIUM CHLORIDE 0.9 % IV BOLUS
100.0000 mL | INTRAVENOUS | Status: AC | PRN
Start: 2021-01-08 — End: 2021-01-08

## 2021-01-08 MED ORDER — INSULIN LISPRO 100 UNIT/ML SOLN (WRAP)
5.0000 [IU] | Freq: Three times a day (TID) | Status: DC
Start: 2021-01-08 — End: 2021-01-09
  Administered 2021-01-08 – 2021-01-09 (×4): 5 [IU] via SUBCUTANEOUS
  Filled 2021-01-08 (×4): qty 15

## 2021-01-08 MED ORDER — SODIUM CHLORIDE 0.9 % IV BOLUS
250.0000 mL | INTRAVENOUS | Status: AC | PRN
Start: 2021-01-08 — End: 2021-01-08

## 2021-01-08 NOTE — Progress Notes (Signed)
Isol. UF -done tolerated well; Net fluid off 3.2L; Pt. Stable throughout tx. Lt. AVG  worked well @400BFR ; Hemostasis achieved in 54min. VSS. Report given to [facility] RN.   01/08/21 1221   Treatment Summary   Time Off Machine 1215   Duration of Treatment (Hours) 2.5   Treatment Type 2:1   Dialyzer Clearance Lightly streaked   Fluid Volume Off (mL) 3600   Prime Volume (mL) 200   Rinseback Volume (mL) 200   Fluid Given: Normal Saline (mL) 0   Fluid Given: PRBC  0 mL   Fluid Given: Albumin (mL) 0   Fluid Given: Other (mL) 0   Total Fluid Given 400   Hemodialysis Net Fluid Removed 3200   Post Treatment Assessment   Post-Treatment Weight (Kg) 93.1   Patient Response to Treatment tolerated well   Vitals   Temp 97 F (36.1 C)   Heart Rate 66   Resp Rate 18   BP 151/64   SpO2 97 %   O2 Device Nasal cannula   Assessment   Mental Status Alert;Oriented;Cooperative   Cardiac (WDL) WDL   Cardiac Regularity Regular   Cardiac Rhythm Normal Sinus Rhythm   Respiratory  WDL   Respiratory Pattern Regular   Bilateral Breath Sounds Clear;Diminished   R Breath Sounds Clear;Diminished   L Breath Sounds Clear;Diminished   Cough Spontaneous   Edema  WDL   Generalized Edema Non Pitting Edema   Facial None   Sacral UTA   RLE Edema Non Pitting Edema   LLE Edema Non Pitting Edema   General Skin Color Appropriate for ethnicity   Skin Condition/Temp Warm;Dry   Gastrointestinal (WDL) WDL   Abdomen Inspection Soft;Nondistended   GI Symptoms None   Mobility Ambulatory with Assistance   Pain Assessment   Charting Type Reassessment   Pain Scale Used Numeric Scale (0-10)   Numeric Pain Scale   Pain Score 0   POSS Score 1   Education   Person taught Patient   Knowledge basis Substantial   Topics taught Fluid Management   Teaching Tools Explain   Reponse Verbalizes Understanding   Bedside Nurse Communication   Name of bedside RN - post dialysis Woinshet Mamo

## 2021-01-08 NOTE — Progress Notes (Addendum)
Pt alert and oriented. Calm and follows commands. 2L NC at night. Contacted  and left message to family members about CPAP home settings. No information was provided about current settings. No reports of shortness of breath, nausea, diarrhea, or chest pain. Still complaints of mild left breast pain. Resting comfortable. Call bell and belongings within reach. Possible discharge in order.

## 2021-01-08 NOTE — PT Eval Note (Signed)
Physical Therapy Eval and Treatment  [name]      Post Acute Care Therapy Recommendations   Discharge Recommendations:  SNF    If recommended discharge disposition is not available, patient will require the following assistance: [place] aide, Supervision, Assist with OOB activity, Assist with I/ADLs, and HH therapy    DME needs IF patient is discharging home: Patient already has needed equipment    Therapy discharge recommendations may change with patient status.  Please refer to most recent note for up-to-date recommendations.    Is an Occupational Therapy Evaluation Indicated at this time? This patient is already on OT caseload.    Unit: [place] [facility] INTERMEDIATE CARE  Bed: K0254/Y7062-B      ___________________________________________________    Time of Evaluation and Treatment:  Time Calculation  PT Received On: 01/08/21  Start Time: 1522  Stop Time: 1551  Time Calculation (min): 29 min    Evaluation Time: 15 minutes  Treatment Time: 14 minutes    Chart Review and Collaboration with Care Team: 6 minutes, not included in above time    PT Visit Number: 1    Consult received for [name] for PT Evaluation and Treatment.  Patient's medical condition is appropriate for Physical therapy intervention at this time.    Activity Orders:  PT eval and treat and progressive mobility protocol    Precautions and Contraindications:  Precautions  Weight Bearing Status: no restrictions  Fall Risks: High, Impaired balance/gait, Impaired mobility, Muscle weakness  Other Precautions: Contact MRSA    Personal Protective Equipment (PPE)  gloves, procedure gown, procedure mask, and surgical / bouffant cap    Medical Diagnosis:  Fluid overload [E87.70]    History of Present Illness:  [name] is a 65 y.o. female admitted on 01/05/2021 with multiple complaints feeling generally unwell, cough with productive green sputum, nauseous  with episodes of vomiting and diarrhea, as well as body aches. Pt notes  that she missed a few dialysis appointments 2/2 not feeling well. Pt has current reports of feeling a bit better today  just some L LE pain 6/10      Patient Active Problem List   Diagnosis    Iron deficiency anemia secondary to inadequate dietary iron intake    Sideropenic dysphagia    Peritoneal dialysis catheter dysfunction, initial encounter    Peritonitis    Upper GI bleed    ESRD (end stage renal disease) on dialysis    Generalized abdominal pain    Thrombocytopenia    h/o orthostatic hypotension    Type 2 diabetes mellitus, with long-term current use of insulin    GERD (gastroesophageal reflux disease)    Bowel incontinence    Right sided numbness    Uncontrolled type 2 diabetes mellitus with hyperglycemia    Hypertension    Hyperlipidemia    History of COPD    Gastritis with hemorrhage    History of anemia due to chronic kidney disease    Open wound, abdominal wall, lateral, subsequent encounter    Gout    Dizziness    Fall    History of CVA (cerebrovascular accident)    Pneumonia due to COVID-19 virus    Diarrhea    Hypertensive urgency    Shortness of breath    ESRD (end stage renal disease)    Hyperkalemia    Pulmonary edema    Hypervolemia    Elevated troponin    Chest pain    Nausea vomiting and  diarrhea    Breast pain, left    Syncope       Past Medical/Surgical History:  Past Medical History:   Diagnosis Date    Chronic obstructive pulmonary disease     Diabetes mellitus     Hyperlipidemia     Hypertension     Sleep apnea 2018     Past Surgical History:   Procedure Laterality Date    EGD, BIOPSY N/A 05/14/2019    Procedure: EGD, BIOPSY;  Surgeon: Ronie Spies, MD;  Location: [facility];  Service: Gastroenterology;  Laterality: N/A;    EXPLORATORY LAPAROTOMY N/A 05/27/2019    Procedure: EXPLORATORY LAPAROTOMY;  Surgeon: Herbert Moors., MD;  Location: [facility];  Service: General;  Laterality: N/A;    JOINT REPLACEMENT      shoulder    JOINT REPLACEMENT      knee    OVARY SURGERY Left      Removal    REMOVAL, FOREIGN BODY N/A 05/27/2019    Procedure: REMOVAL, PERITONEAL DIALYSIS CATHETER;  Surgeon: Herbert Moors., MD;  Location: [facility];  Service: General;  Laterality: N/A;    TUNNELED CATH CHECK/CHANGE (PERMCATH) N/A 07/03/2019    Procedure: TUNNELED CATH CHECK/CHANGE;  Surgeon: Maureen Ralphs, MD;  Location: [facility];  Service: Interventional Radiology;  Laterality: N/A;    TUNNELED CATH PLACEMENT (PERMCATH) Bilateral 05/23/2019    Procedure: TUNNELED CATH PLACEMENT;  Surgeon: Jacques Earthly, MD;  Location: [facility];  Service: Interventional Radiology;  Laterality: Bilateral;    TUNNELED CATH PLACEMENT (PERMCATH) Right 07/31/2019    Procedure: TUNNELED CATH PLACEMENT;  Surgeon: Jacques Earthly, MD;  Location: [facility];  Service: Interventional Radiology;  Laterality: Right;       X-Rays/Tests/Labs:  Lab Results   Component Value Date/Time    HGB 11.0 (L) 01/08/2021 03:35 AM    HCT 35.9 01/08/2021 03:35 AM    K 4.6 01/08/2021 03:35 AM    NA 138 01/08/2021 03:35 AM    INR 1.1 07/01/2019 10:41 PM    TROPI 93.3 (A) 01/05/2021 03:02 PM    TROPI -4.5 01/05/2021 03:02 PM    TROPI 97.8 (A) 01/05/2021 11:21 AM       All imaging reviewed, please see chart for details.    Social History:  Prior Level of Function  Prior level of function: Independent with ADLs, Ambulates with assistive device  Assistive Device: Single point cane  Baseline Activity Level: Household ambulation  Driving: does not drive  Cooking: light meal prep  Employment: Retired  DME Currently at Home: ADL- Education officer, environmental, ADL- Forensic scientist, ADL- Civil engineer, contracting, [place], [place], Environmental consultant, [place]  Living Arrangements: Children (Son)  Type of Home: Apartment Acupuncturist)  Home Layout: One level, Media planner (0 STE)  Bathroom Shower/Tub: Administrator, Civil Service: Raised  Bathroom Equipment: Grab bars in shower, Civil engineer, contracting, Grab bars around toilet, Print production planner Accessibility:  Accessible  DME Currently at Home: ADL- Education officer, environmental, ADL- [employer], ADL- Civil engineer, contracting, [place], [place], [place], [place] - Notes / Comments: Son lives with her; Pt also had aide come 3-11pm to assist with some ADLs and IADLs      Subjective:  Patient is agreeable to participation in the therapy session. Nursing clears patient for therapy.  Patient Goal: To get stronger and walk better  Pain Assessment  Pain Assessment: Numeric Scale (0-10)  Pain Score:  6-moderate pain  POSS Score: Awake and Alert  Pain Location: Leg  Pain Orientation: Left  Pain Frequency: Intermittent  Pain Intervention(s): Ambulation/increased activity;Emotional support;Therapeutic presence      Objective:  Observation of Patient/Vital Signs:  BP 148/61   Pulse 72  SpO2 93%       Inspection/Posture: Pt found sitting on commode    Cognitive Status and Neuro Exam:  Cognition/Neuro Status  Arousal/Alertness: Appropriate responses to stimuli  Attention Span: Appears intact  Orientation Level: Oriented X4  Memory: Appears intact  Following Commands: Follows all commands and directions without difficulty  Safety Awareness: minimal verbal instruction  Insights: Fully aware of deficits  Problem Solving: Assistance required to identify errors made;minimal assistance  Behavior: calm;cooperative;attentive  Motor Planning: intact  Coordination: intact    Musculoskeletal Examination  Gross ROM  Right Upper Extremity ROM: within functional limits  Left Upper Extremity ROM: needs focused assessment  LUE ROM - Shoulder: reduced by 50% (dialysis port)  Right Lower Extremity ROM: within functional limits  Left Lower Extremity ROM: within functional limits  Gross Strength  Right Upper Extremity Strength: 4/5  Left Upper Extremity Strength: 3/5  Right Lower Extremity Strength: 4/5  Left Lower Extremity Strength: 3+/5       Functional Mobility:  Functional Mobility  Rolling: Stand by Assist;to Left  Scooting to EOB: Contact Guard Assist;Increased  Time;Increased Effort  Sit to Supine: Minimal Assist (Min A at LE)  Sit to Stand: Contact Guard Assist;Increased Time;Increased Effort;bed elevated;with instruction for hand placement to increase safety  Stand to Sit: Supervision     Locomotion  Ambulation: Contact Guard Assist;with front-wheeled walker  Pattern: R foot decreased clearance;L foot decreased clearance (trunk flexion)     Balance  Balance  Balance: needs focused assessment  Sitting - Static: Good  Sitting - Dynamic: Good  Standing - Static: Fair  Standing - Dynamic: Fair    Participation and Activity Tolerance  Participation and Endurance  Participation Effort: excellent  Endurance: Tolerates 10 - 20 min exercise with multiple rests    Educated the Patient to role of physical therapy, plan of care, goals of therapy and HEP, safety with mobility and ADLs, energy conservation techniques with verbalized understanding  and demonstrated understanding.    Patient left in bed with alarm and all other medical equipment in place and call bell and all personal items/needs within reach.  RN notified of session outcome. CM team and attending have been previously notified of d/c recommendation; no updates to d/c recommendation since that time.      Assessment:  [name] is a 65 y.o. female admitted 01/05/2021.  PT Assessment  Assessment: Decreased UE strength;Decreased LE strength;Decreased UE ROM;Decreased safety/judgement during functional mobility;Decreased endurance/activity tolerance;Decreased functional mobility;Decreased balance;Gait impairment  Prognosis: Good;With continued PT status post acute discharge  Progress: Progressing toward goals    Pt would benefit from cont skilled PT intervention to further address functional deficits. PT D/C recommendation is SNF2/2 decreased strength and activity tolerance.     Treatment:  Ankle pumps B LE 10 reps x 2 sets  LAQ 10 reps B LE with 3 sec hold PT facilitation for improved mm recruitment  Additional sit  to stand x 2 with PT instruction for hand placement       Plan:  Treatment/Interventions: Exercise, Gait training, Neuromuscular re-education, Functional transfer training, LE strengthening/ROM, Endurance training, Equipment eval/education, Bed mobility, Compensatory technique education  PT Frequency: 2-3x/wk  Risks/Benefits/POC Discussed with Pt/Family: With patient  PMP Activity: Step 6 - Walks in Room  Distance Walked (ft) (Step 6,7): 15 Feet (2 laps in room)      Goals:  Goals  Goal Formulation: With patient  Time for Goal Acheivement: By time of discharge  Pt Will Roll Left: modified independent, to maximize functional mobility and independence  Pt Will Roll Right: modified independent, to maximize functional mobility and independence  Pt Will Perform Sit To Supine: with supervision, to maximize functional mobility and independence  Pt Will Perform Sit to Stand: with stand by assist, to maximize functional mobility and independence  Pt Will Ambulate: 51-100 feet, with rolling walker, with supervision, to maximize functional mobility and independence    Vickii Penna PT, DPT    01/08/2021 4:56 PM      [facility]  Patient: [name] MRN#: [id number]  Unit: [place] [facility] INTERMEDIATE CARE Bed: A4699/Z8020-C

## 2021-01-08 NOTE — Progress Notes (Signed)
[facility] PROGRESS NOTE  703-KIDNEYS    Date Time: 01/08/21 8:18 AM  Patient Name: [name]  Attending Physician: Marya Landry, MD    CC: follow-up ESRD, vol overload    Assessment:     1. ESRD on HD MWF at [facility] under Dr Corky Mull.   - Missed HD x>1 week.   2. Hyperkalemia due to missed HD; resolved   3. Metabolic acidosis 2/2 missed HD; resolved   4. Anemia of CKD  5. HTN: still elevated  6. Volume Overload: better, but persisten  7. Diarrhea and Vomiting: resolved     Recommendations:     Plan for d/c to SNF, we will plan for UF today    Case discussed with: patient, Dr. Mallie Darting, HD RN    Renard Hamper, MD  [facility]  703-KIDNEYS (office)    Subjective:   Doing well on 2L NC    Review of Systems:      No CP, abd pain    Physical Exam:     Vitals:    01/08/21 0318 01/08/21 0620 01/08/21 0700 01/08/21 0750   BP: 151/65   174/76   Pulse: 69 67 67 71   Resp: 15 18 20  (!) 28   Temp: 97.5 F (36.4 C)   98.5 F (36.9 C)   TempSrc: Oral   Oral   SpO2: 98%   97%   Weight: 103.1 kg (227 lb 3.2 oz)      Height:           Intake and Output Summary (Last 24 hours) at Date Time    Intake/Output Summary (Last 24 hours) at 01/08/2021 0818  Last data filed at 01/07/2021 1825  Gross per 24 hour   Intake --   Output 2400 ml   Net -2400 ml         General: awake, alert, oriented x 3, no acute distress  Cardiovascular: regular rate and rhythm  Lungs: clear to auscultation bilaterally, bilateral air entry, normal work of breathing  Abdomen: soft  Extremities: no edema, LUE AVF      Meds:      Scheduled Meds: PRN Meds:    amLODIPine, 10 mg, Oral, Daily  amoxicillin-clavulanate, 1 tablet, Oral, Daily  aspirin, 81 mg, Oral, Daily  atorvastatin, 40 mg, Oral, QHS  carvedilol, 25 mg, Oral, Q12H SCH  doxycycline, 100 mg, Oral, Q12H SCH  gabapentin, 300 mg, Oral, BID  [Held by provider] heparin (porcine), 5,000 Units, Subcutaneous, Q8H SCH  insulin glargine, 35 Units,  Subcutaneous, QHS  insulin lispro, 15 Units, Subcutaneous, TID AC  lactobacillus/streptococcus, 1 capsule, Oral, BID  sevelamer, 800 mg, Oral, TID MEALS        Continuous Infusions:   acetaminophen, 650 mg, Q6H PRN  albumin human, 100 mL, PRN  glucagon (rDNA), 1 mg, PRN   And  dextrose, 250 mL, PRN   And  dextrose, 25 g, PRN   And  dextrose, 25 g, PRN  glucagon (rDNA), 1 mg, PRN   And  dextrose, 250 mL, PRN   And  dextrose, 25 g, PRN   And  dextrose, 25 g, PRN  melatonin, 3 mg, QHS PRN  naloxone, 0.2 mg, PRN  ondansetron, 4 mg, Q6H PRN   Or  ondansetron, 4 mg, Q6H PRN  sodium chloride, 100 mL, Q1H PRN  sodium chloride, 250 mL, PRN            Labs:  Recent Labs   Lab 01/08/21  0335 01/07/21  0317 01/06/21  1636   WBC 4.62 4.22 3.61   Hgb 11.0* 10.3* 9.9*   Hematocrit 35.9 33.0* 31.3*   Platelets 76* 75* 81*       Recent Labs   Lab 01/08/21  0335 01/07/21  0317 01/06/21  0319 01/05/21  1018   Sodium 138 140 138 136   Potassium 4.6 4.3 4.5 4.9   Chloride 101 104 103 100   CO2 29 27 25 22    BUN 33.0* 33.0* 40.0* 69.0*   Creatinine 4.0* 4.5* 6.3* 9.4*   Calcium 8.2* 8.1* 7.7* 8.4*   Albumin 2.4* 2.4* 2.3* 2.9*   Phosphorus 3.1 3.4 5.1* 7.0*   Magnesium  --   --   --  2.2   Glucose 443* 325* 376* 401*   EGFR 13.6 11.8 8.0 5.1         Recent Labs   Lab 01/06/21  0319   Urine Type Urine, Clean Ca   Color, UA Yellow   Clarity, UA Hazy   Specific Gravity UA 1.014   Urine pH 7.0   Nitrite, UA Negative   Ketones UA Negative   Urobilinogen, UA Negative   Bilirubin, UA Negative   Blood, UA Negative   RBC, UA 3 - 5   WBC, UA 0 - 5           Signed by: Renard Hamper, MD

## 2021-01-08 NOTE — Plan of Care (Addendum)
Pt AAOx4, consumed 100% of daily meal. No c/o pain/ discomfort. . hemodialysis aces site clean and intact, Bruit and thrill present,  no bleeding. Blood sugar monitoring in progress. Safety and comfort measures in place. Floor mat present.   1900 End of shift report given to PM Nurse.   Problem: Safety  Goal: Patient will be free from injury during hospitalization  Outcome: Progressing  Goal: Patient will be free from infection during hospitalization  Outcome: Progressing     Problem: Pain  Goal: Pain at adequate level as identified by patient  Outcome: Progressing     Problem: Renal Instability  Goal: Fluid and electrolyte balance are achieved/maintained  Outcome: Progressing     Problem: Patient Receiving Advanced Renal Therapies  Goal: Therapy access site remains intact  Outcome: Progressing     Problem: Moderate/High Fall Risk Score >5  Goal: Patient will remain free of falls  Outcome: Progressing

## 2021-01-08 NOTE — PT Eval Note (Signed)
[facility]  Physical Therapy Attempt Note    Patient:  [name] MRN#:  [id number]  Unit:  [facility] INTERMEDIATE CARE Room/Bed:  Q2863/O1771-H      Physical Therapy evaluation attempted on 01/08/2021 at 11:21 AM unable to complete secondary to patient off the floor for dialysis treatment.  Physical therapy will follow up later today as able.     RN aware.    Vickii Penna PT, DPT    01/08/2021 11:22 AM

## 2021-01-08 NOTE — Discharge Summary (Signed)
[facility]      Patient: [name]  Admission Date: 01/05/2021   DOB: Sep 03, 1955  Discharge Date: 01/08/2021    MRN: [id number]  Discharge Attending:Naryiah Schley Karmen Stabs, MD     Referring Physician: Merryl Hacker, None, MD  PCP: Pcp, None, MD       DISCHARGE SUMMARY     Discharge Information   Admission Diagnosis:   Pulmonary edema    Discharge Diagnosis:   Active Hospital Problems    Diagnosis    Pulmonary edema    Hypervolemia    Elevated troponin    Chest pain    Nausea vomiting and diarrhea    Breast pain, left    Syncope    Shortness of breath    Hypertensive urgency    Uncontrolled type 2 diabetes mellitus with hyperglycemia    Hyperlipidemia    Thrombocytopenia    Generalized abdominal pain    ESRD (end stage renal disease) on dialysis        Admission Condition: Guarded  Discharge Condition: Improving  Consultants: Nephrology, cardiology  Functional Status: Able to dangle at bedside with assistance  Discharged to: SNF    Discharge Medications:     Medication List        START taking these medications      acetaminophen 325 MG tablet  Commonly known as: TYLENOL  Take 2 tablets (650 mg) by mouth every 6 (six) hours as needed for Pain or Fever     amoxicillin-clavulanate 500-125 MG per tablet  Commonly known as: AUGMENTIN  Take 1 tablet by mouth daily for 7 days     doxycycline 100 MG capsule  Commonly known as: MONODOX  Take 1 capsule (100 mg) by mouth every 12 (twelve) hours for 7 days     lactobacillus/streptococcus Caps  Take 1 capsule by mouth 2 (two) times daily     ondansetron 4 MG disintegrating tablet  Commonly known as: ZOFRAN-ODT  Take 1 tablet (4 mg) by mouth every 6 (six) hours as needed for Nausea            CHANGE how you take these medications      insulin aspart 100 UNIT/ML injection  Commonly known as: NovoLOG  Inject 10 Units into the skin 3 (three) times daily before meals  What changed: how much to take     [place] FlexTouch 200 UNIT/ML Sopn  Generic drug: Insulin Degludec  Inject 40 Units  into the skin nightly  What changed: how much to take            CONTINUE taking these medications      albuterol sulfate HFA 108 (90 Base) MCG/ACT inhaler  Commonly known as: PROVENTIL     amLODIPine 10 MG tablet  Commonly known as: NORVASC  Take 1 tablet (10 mg total) by mouth daily     aspirin 81 MG chewable tablet     atorvastatin 40 MG tablet  Commonly known as: LIPITOR     carvedilol 25 MG tablet  Commonly known as: COREG     gabapentin 600 MG tablet  Commonly known as: NEURONTIN  Take 1 tablet (600 mg) by mouth 2 (two) times daily     sevelamer 800 MG tablet  Commonly known as: RENVELA            STOP taking these medications      vitamin C 500 MG tablet  Commonly known as: ASCORBIC ACID  Where to Get Your Medications        Information about where to get these medications is not yet available    Ask your nurse or doctor about these medications  acetaminophen 325 MG tablet  amoxicillin-clavulanate 500-125 MG per tablet  doxycycline 100 MG capsule  gabapentin 600 MG tablet  insulin aspart 100 UNIT/ML injection  lactobacillus/streptococcus Caps  ondansetron 4 MG disintegrating tablet  Tyler Aas FlexTouch 200 UNIT/ML [facility]   Presentation History   As per admitting provider, "65 y.o. female with a PMH of HTN, HLD, DM2, ESRD on HD MWF presenting to the ED for shortness of breath.  Patient states she has not been to dialysis since last Monday because she has not been feeling well due to body aches, abdominal pain nausea vomiting and diarrhea.  Reports chest pain, shortness of breath and leg swelling.  States she did not take [place] 35 units last night.  Denies syncope, fever, hematemesis, hematochezia, melena, dysuria, hematuria, focal weakness"    See HPI for details.    Hospital Course (0 Days)     #Pulmonary edema  #Hypervolemia  #ESRD on HD MWF  #Hypertensive urgency  -Patient presented with shortness of breath secondary to pulmonary edema/hypervolemia from missing her  hemodialysis.  Patient also found to have hypertensive urgency with blood pressure going up to 235/112    S/p IV labetalol in the ED with improvement.  Missed dialysis.    Status post nephrology evaluation and hemodialysis with ultrafiltration on 01/05/2021, 01/06/2021 and 01/07/2021    Continue home amlodipine and Coreg.      Patient to continue her hemodialysis on Monday Wednesday Friday     #Elevated troponin  #Chest pain and shortness of breath  -Patient presenting with chest discomfort along with mild troponin elevation  -EKG reviewed, mild troponin elevation.   -Status post cardiology evaluation  -Patient's elevated troponin likely secondary to demand ischemia in the setting of hypertensive crisis, flash pulmonary edema and missed dialysis  -Continue aspirin and statin for presumed CAD  -Volume control with hemodialysis       #Abdominal pain  #Nausea vomiting diarrhea  -Likely viral etiology.  Abdomen benign on exam. Afebrile, no leukocytosis.  COVID-19 and influenza negative.  CXR with no infiltrate. CT abdomen pelvis shows anasarca otherwise no acute findings.    Patient reported significant improvement in her symptoms after fluid removal  -Continue to monitor as an outpatient        #DM with hyperglycemia  -BG 401. s/p 10 units insulin lispro in the ED.  -Currently better controlled  -Hemoglobin A1c 11.8 on 10/21/2020  -Repeat A1c on 01/07/2021 was 11.7     -Continue with Tresiba 40 units qhs, Humalog 10 units with meals and adjust as tolerated    #Left breast pain  -In the ED, patient reported left breast pain.  Per ED physician, left breast appears slightly larger than right but no induration, fluctuance or erythema.  -Status post ultrasound of left breast showing subcutaneous edema at 3:00 region of left breast.  No sonographic evidence of obvious mass.  Patient did have prominent lymph node in left axilla with cortical thickening measuring 2.0 x 1.0 x 1.6 cm    Patient to follow-up with primary care  physician as an outpatient for diagnostic mammogram    Patient's case discussed with infectious disease.  Patient with history of Acinetobacter complex CRO and MRSA wound in the  past we will give Augmentin and doxycycline to cover for Acinetobacter and MRSA as per infectious's recommendation    #?  Syncope  -Patient had possible syncopal episode working with occupational therapy   -Continue with PT/OT     #HLD  -Continue Lipitor     #Thrombocytopenia  -Appears chronic per chart review.  No evidence of bleeding.    -Monitor for now     BMI Assessment:  - Body mass index is 30.18 kg/m.  - Meets criteria for overweight given BMI between 25 and 30 due to excess calories/nutritional status. Weight loss and lifestyle modifications encouraged.    ____________________________________________________  Addendum    This patient was discharged on 01/07/2021.  Unfortunate patient's discharge was delayed due to delaying securing SNF facility.    Patient currently deciding between discharge to SNF facility versus home with home PT/OT.  Case management on board    Patient will be discharged in hemodynamically stable condition once safe discharge planning has been set up       Procedures/Imaging:   CT Abd/ Pelvis without Contrast    Result Date: 01/05/2021  1. No evidence of ureteral stone or hydronephrosis. 2. Anasarca.  Elnita Maxwell, MD  01/05/2021 1:31 PM    XR Chest  AP Portable    Result Date: 01/05/2021   pulmonary edema Elyn Peers, MD  01/05/2021 10:46 AM    US Breast Left Ltd    Result Date: 01/07/2021   1. Subcutaneous edema and 3:00 region of left breast. No sonographic evidence of an obvious mass in the upper outer quadrant of left breast. 2. Prominent lymph node in left axilla with cortical thickening measuring 2.0 x 1.0 x 1.6 cm. 3. Diagnostic mammograms are recommended for a more complete evaluation. Next CATEGORY 0: INCOMPLETE - NEED ADDITIONAL IMAGING EVALUATION. WRITTEN RESULTS PROVIDED TO THE PATIENT PER MQSA  REQUIREMENTS.   BIRAD: 24 Isabelle Course, MD  01/07/2021 1:13 PM         Best Practices   Was the patient admitted with either a CHF Exacerbation or Pneumonia? no     Progress Note/Physical Exam at Discharge     Subjective: Today patient reports that she feels okay currently denies any fever chills nausea vomiting diarrhea chest pain shortness of breath reports improving left breast pain    Vitals:    01/08/21 1221 01/08/21 1239 01/08/21 1339 01/08/21 1559   BP: 151/64 131/74 153/72 148/61   Pulse: 66 68  72   Resp: 18 16  17    Temp: 97 F (36.1 C) 98.1 F (36.7 C)  98.1 F (36.7 C)   TempSrc:  Oral  Axillary   SpO2: 97% 95%  93%   Weight:       Height:           GEN APPEARANCE: Normal;  A&OX3, lethargic, seen during hemodialysis  HEENT: PERLA; EOMI; Conjunctiva Clear  NECK: Supple; No bruits  CVS: Bradycardic, S1, S2; No M/G/R  LUNGS: CTAB; No Wheezes; No Rhonchi: No rales  ABD: Soft; No TTP; + Normoactive BS  EXT: No edema; Pulses 2+ and intact, AV fistula noted on left arm  Skin exam:  no pallor, erythema and mild swelling noted on left breast from 3:00 to 5:00  NEURO: CN 2-12 intact; No Focal neurological deficits  CAP REFILL:  Normal  MENTAL STATUS:  Normal       Diagnostics     Labs/Studies Pending at Discharge: No    Last Labs   Recent  Labs   Lab 01/08/21  0335 01/07/21  0317 01/06/21  1636   WBC 4.62 4.22 3.61   RBC 3.63* 3.43* 3.30*   Hgb 11.0* 10.3* 9.9*   Hematocrit 35.9 33.0* 31.3*   MCV 98.9* 96.2* 94.8   Platelets 76* 75* 81*         Recent Labs   Lab 01/08/21  0335 01/07/21  0317 01/06/21  0319 01/05/21  1018   Sodium 138 140 138 136   Potassium 4.6 4.3 4.5 4.9   Chloride 101 104 103 100   CO2 29 27 25 22    BUN 33.0* 33.0* 40.0* 69.0*   Creatinine 4.0* 4.5* 6.3* 9.4*   Glucose 443* 325* 376* 401*   Calcium 8.2* 8.1* 7.7* 8.4*   Magnesium  --   --   --  2.2         Microbiology Results (last 15 days)       Procedure Component Value Units Date/Time    COVID-19 (SARS-CoV-2) and Influenza A/B, NAA (Liat  Rapid)- Admission [[id number]] Collected: 01/05/21 1040    Order Status: Completed Specimen: Culturette from Nasopharyngeal Updated: 01/05/21 1239     Purpose of COVID testing Diagnostic -PUI     SARS-CoV-2 Specimen Source Nasal Swab     SARS CoV 2 Overall Result Not Detected     Comment: __________________________________________________  -A result of "Detected" indicates POSITIVE for the    presence of SARS CoV-2 RNA  -A result of "Not Detected" indicates NEGATIVE for the    presence of SARS CoV-2 RNA  __________________________________________________________  Test performed using the Roche cobas Liat SARS-CoV-2 assay. This assay is  only for use under the Food and Drug Administration's Emergency Use  Authorization. This is a real-time RT-PCR assay for the qualitative  detection of SARS-CoV-2 RNA. Viral nucleic acids may persist in vivo,  independent of viability. Detection of viral nucleic acid does not imply the  presence of infectious virus, or that virus nucleic acid is the cause of  clinical symptoms. Negative results do not preclude SARS-CoV-2 infection and  should not be used as the sole basis for diagnosis, treatment or other  patient management decisions. Negative results must be combined with  clinical observations, patient history, and/or epidemiological information.  Invalid results may be due to inhibiting substances in the specimen and  recollection should occur. Please see Fact Sheets for patients and providers  located:  https://www.benson-chung.com/          Influenza A Not Detected     Influenza B Not Detected     Comment: Test performed using the Roche cobas Liat SARS-CoV-2 & Influenza A/B assay.  This assay is only for use under the Food and Drug Administration's  Emergency Use Authorization. This is a multiplex real-time RT-PCR assay  intended for the simultaneous in vitro qualitative detection and  differentiation of SARS-CoV-2, influenza A, and influenza B virus RNA.  Viral  nucleic acids may persist in vivo, independent of viability. Detection of  viral nucleic acid does not imply the presence of infectious virus, or that  virus nucleic acid is the cause of clinical symptoms. Negative results do  not preclude SARS-CoV-2, influenza A, and/or influenza B infection and  should not be used as the sole basis for diagnosis, treatment or other  patient management decisions. Negative results must be combined with  clinical observations, patient history, and/or epidemiological information.  Invalid results may be due to inhibiting substances in the specimen and  recollection should  occur. Please see Fact Sheets for patients and providers  located: http://olson-hall.info/.         Narrative:      o Collect and clearly label specimen type:  o PREFERRED-Upper respiratory specimen: One Nasal Swab in  Transport Media.  o Hand deliver to laboratory ASAP  Diagnostic -PUI             Patient Instructions   Discharge Diet: Diabetic renal  Discharge Activity: As tolerated    Follow Up Appointment:   Follow-up Information       Pcp, None, MD .                              Time spent examining patient, discussing with patient/family regarding hospital course, chart review, reconciling medications and discharge planning: > 35 minutes.    Signed,  Marya Landry, MD    5:40 PM 01/08/2021

## 2021-01-08 NOTE — Plan of Care (Signed)
Problem: Renal Instability  Goal: Fluid and electrolyte balance are achieved/maintained  Flowsheets (Taken 01/08/2021 1024)  Fluid and electrolyte balance are achieved/maintained:   Monitor intake and output every shift   Provide adequate hydration   Assess and reassess fluid and electrolyte status   Observe for seizure activity and initiate seizure precautions if indicated   Monitor for muscle weakness   Monitor daily weight   Assess for confusion/personality changes   Observe for cardiac arrhythmias   Follow fluid restrictions/IV/PO parameters   Monitor/assess lab values and report abnormal values     Problem: Patient Receiving Advanced Renal Therapies  Goal: Therapy access site remains intact  Flowsheets (Taken 01/08/2021 1024)  Therapy access site remains intact: Assess therapy access site

## 2021-01-08 NOTE — Progress Notes (Signed)
Pre HD report received from Prim. RN; Pt. Arrived to HD a/ox4; on O2 via NC @ 3L/min; Sat 97%.  No discomfort pre tx.; VS-WNL; Pt. For Isol. UF only today. Lt. AVG- no cannulation problem; w/ good flow.    01/08/21 0920   Bedside Nurse Communication   Name of bedside RN - pre dialysis Woinshet Mamo   Treatment Initiation- With Dialysis Precautions   Time Out/Safety Check Completed Yes   Consent for HD signed for this hospitalization (Date) 10/20/20   Consent for HD signed for this hospitalization (Time) 1545   Preferred language Yes   Blood Consent Verified N/A   Dialysis Precautions All Connections Secured;Saline Line Double Clamped   Dialysis Treatment Type Routine   Is patient diabetic? Yes   RO/Hemodialysis Architectural technologist   Is Total Chlorine less than 0.1 ppm? Yes   Orignial Total Chlorine Testing Time 0850   At 4 Hour Total Chlorine Testing Time 1250   RO/Hemodialysis Facilities manager Number 3    Machine Serial Number [id number]   RO # 25   RO Serial # [id number]   Water Hardness NA   pH 7.2   Pressure Test Verified Yes   Alarms Verified Passed   Machine Temperature 97.7 F (36.5 C)   Alarms Verified Yes   Na+ mEq (Machine) 138 mEq   Bicarb mEq (Machine) 35 mEq   Hemodialysis Conductivity (Machine) 13.9   Hemodialysis Conductivity (Meter) 14   Dialyzer Lot Number [id number]   Tubing Lot Number [id number]   RO Machine Log Completed Yes   Hepatitis Status   HBsAg (Antigen) Result Negative   HBsAg Date Drawn 01/05/21   HBsAg Repeat Draw Due Date 02/02/21   Dialysis Weight   Pre-Treatment Weight (Kg) 110   Scale Type ICU Bed Scale   Vitals   Temp 97.9 F (36.6 C)   BP 157/77   O2 Device Nasal cannula   Assessment   Mental Status Alert;Oriented;Cooperative   Cardiac (WDL) WDL   Cardiac Regularity Regular   Cardiac Rhythm Normal Sinus Rhythm   Respiratory  WDL   Respiratory Pattern Regular   Bilateral Breath Sounds Clear;Diminished   R Breath Sounds Clear;Diminished   L Breath Sounds Clear;Diminished    Cough Spontaneous   Edema  X   Generalized Edema +1   Facial None   Sacral UTA   RLE Edema +1   LLE Edema Non Pitting Edema   General Skin Color Appropriate for ethnicity   Skin Condition/Temp Warm;Dry   Gastrointestinal (WDL) WDL   Abdomen Inspection Soft;Nondistended   GI Symptoms None   Mobility Ambulatory with Assistance   Pain Assessment   Charting Type Assessment   Pain Scale Used Numeric Scale (0-10)   Numeric Pain Scale   Pain Score 0   POSS Score 1   Hemodialysis Comments   Pre-Hemodialysis Comments Timeout/safety check- done   Hemodialysis History Information   Chronic Dialysis Patient Yes   Outpatient Nephrologist Dr. Shaune Pascal

## 2021-01-09 LAB — RENAL FUNCTION PANEL
Albumin: 2.6 g/dL — ABNORMAL LOW (ref 3.5–5.0)
Anion Gap: 15 (ref 5.0–15.0)
BUN: 60 mg/dL — ABNORMAL HIGH (ref 7.0–21.0)
CO2: 22 mEq/L (ref 17–29)
Calcium: 8.6 mg/dL (ref 8.5–10.5)
Chloride: 103 mEq/L (ref 99–111)
Creatinine: 5.2 mg/dL — ABNORMAL HIGH (ref 0.4–1.0)
Glucose: 341 mg/dL — ABNORMAL HIGH (ref 70–100)
Phosphorus: 3.8 mg/dL (ref 2.3–4.7)
Potassium: 4.4 mEq/L (ref 3.5–5.3)
Sodium: 140 mEq/L (ref 135–145)

## 2021-01-09 LAB — CBC
Absolute NRBC: 0 10*3/uL (ref 0.00–0.00)
Hematocrit: 34.8 % (ref 34.7–43.7)
Hgb: 10.6 g/dL — ABNORMAL LOW (ref 11.4–14.8)
MCH: 29.8 pg (ref 25.1–33.5)
MCHC: 30.5 g/dL — ABNORMAL LOW (ref 31.5–35.8)
MCV: 97.8 fL — ABNORMAL HIGH (ref 78.0–96.0)
MPV: 12.8 fL — ABNORMAL HIGH (ref 8.9–12.5)
Nucleated RBC: 0 /100 WBC (ref 0.0–0.0)
Platelets: 73 10*3/uL — ABNORMAL LOW (ref 142–346)
RBC: 3.56 10*6/uL — ABNORMAL LOW (ref 3.90–5.10)
RDW: 13 % (ref 11–15)
WBC: 6.54 10*3/uL (ref 3.10–9.50)

## 2021-01-09 LAB — GLUCOSE WHOLE BLOOD - POCT
Whole Blood Glucose POCT: 314 mg/dL — ABNORMAL HIGH (ref 70–100)
Whole Blood Glucose POCT: 221 mg/dL — ABNORMAL HIGH (ref 70–100)
Whole Blood Glucose POCT: 264 mg/dL — ABNORMAL HIGH (ref 70–100)
Whole Blood Glucose POCT: 280 mg/dL — ABNORMAL HIGH (ref 70–100)

## 2021-01-09 LAB — GFR: EGFR: 10

## 2021-01-09 MED ORDER — TRESIBA FLEXTOUCH 200 UNIT/ML SC SOPN
45.0000 [IU] | PEN_INJECTOR | Freq: Every evening | SUBCUTANEOUS | Status: DC
Start: 2021-01-09 — End: 2021-02-04

## 2021-01-09 MED ORDER — INSULIN GLARGINE 100 UNIT/ML SC SOLN
45.0000 [IU] | Freq: Every evening | SUBCUTANEOUS | Status: DC
Start: 2021-01-09 — End: 2021-01-12
  Administered 2021-01-09 – 2021-01-11 (×3): 45 [IU] via SUBCUTANEOUS
  Filled 2021-01-09 (×3): qty 45

## 2021-01-09 MED ORDER — INSULIN LISPRO 100 UNIT/ML SOLN (WRAP)
10.0000 [IU] | Freq: Three times a day (TID) | Status: DC
Start: 2021-01-10 — End: 2021-01-12
  Administered 2021-01-10 – 2021-01-12 (×7): 10 [IU] via SUBCUTANEOUS
  Filled 2021-01-09 (×7): qty 30

## 2021-01-09 NOTE — Progress Notes (Signed)
Pt will need insurance auth for SNF.  PT note updated yesterday, 12/17.  Will need updated OT note to submit for auth 12/19.  [place] willing to start auth. CM will continue following for discharge planning.    Marzetta Merino, RN  RN Case Manager [facility]  [phone]

## 2021-01-09 NOTE — Progress Notes (Signed)
[facility] PROGRESS NOTE  [id number]    Date Time: 01/09/21 5:29 PM  Patient Name: [name]  Attending Physician: Marya Landry, MD    CC: follow-up ESRD, vol overload    Assessment:     1. ESRD on HD MWF at [facility] under Dr Corky Mull.   - Missed HD x>1 week.   2. Hyperkalemia due to missed HD; resolved   3. Metabolic acidosis 2/2 missed HD; resolved   4. Anemia of CKD  5. HTN: still elevated  6. Volume Overload: better, but persisten  7. Diarrhea and Vomiting: resolved     Recommendations:     Plan for d/c to SNF eventually  (pending placement)  Plan for HD on 12/19 per routine    Case discussed with: patient    Nat Lowenthal Oretha Ellis, MD  [facility]  [id number] (office)    Subjective:   Doing well on RA to 2L NC    Review of Systems:      No CP, abd pain    Physical Exam:     Vitals:    01/09/21 0336 01/09/21 0740 01/09/21 1213 01/09/21 1607   BP: 137/62 153/76 122/55 130/69   Pulse: 72 77 68 72   Resp: 18 20 20  (!) 26   Temp: 97.3 F (36.3 C) 97 F (36.1 C) 98.1 F (36.7 C) 98.4 F (36.9 C)   TempSrc: Oral Axillary Axillary Axillary   SpO2: 99% 98% 94% 95%   Weight: 94.7 kg (208 lb 12.4 oz)      Height:           Intake and Output Summary (Last 24 hours) at Date Time  No intake or output data in the 24 hours ending 01/09/21 1729      General: awake, alert, oriented x 3, no acute distress  Cardiovascular: regular rate and rhythm  Lungs: clear to auscultation bilaterally, bilateral air entry, normal work of breathing  Abdomen: soft  Extremities: no edema, LUE AVF      Meds:      Scheduled Meds: PRN Meds:    amLODIPine, 10 mg, Oral, Daily  amoxicillin-clavulanate, 1 tablet, Oral, Daily  aspirin, 81 mg, Oral, Daily  atorvastatin, 40 mg, Oral, QHS  carvedilol, 25 mg, Oral, Q12H SCH  doxycycline, 100 mg, Oral, Q12H SCH  gabapentin, 300 mg, Oral, BID  [Held by provider] heparin (porcine), 5,000 Units, Subcutaneous, Q8H SCH  insulin glargine, 40 Units,  Subcutaneous, QHS  insulin lispro, 5 Units, Subcutaneous, TID AC  lactobacillus/streptococcus, 1 capsule, Oral, BID  sevelamer, 800 mg, Oral, TID MEALS        Continuous Infusions:   acetaminophen, 650 mg, Q6H PRN  glucagon (rDNA), 1 mg, PRN   And  dextrose, 250 mL, PRN   And  dextrose, 25 g, PRN   And  dextrose, 25 g, PRN  glucagon (rDNA), 1 mg, PRN   And  dextrose, 250 mL, PRN   And  dextrose, 25 g, PRN   And  dextrose, 25 g, PRN  melatonin, 3 mg, QHS PRN  naloxone, 0.2 mg, PRN  ondansetron, 4 mg, Q6H PRN   Or  ondansetron, 4 mg, Q6H PRN            Labs:     Recent Labs   Lab 01/09/21  0348 01/08/21  0335 01/07/21  0317   WBC 6.54 4.62 4.22   Hgb 10.6* 11.0* 10.3*   Hematocrit 34.8 35.9 33.0*   Platelets 73*  76* 75*       Recent Labs   Lab 01/09/21  0348 01/08/21  0335 01/07/21  0317 01/06/21  0319 01/05/21  1018   Sodium 140 138 140  More results in Results Review 136   Potassium 4.4 4.6 4.3  More results in Results Review 4.9   Chloride 103 101 104  More results in Results Review 100   CO2 22 29 27   More results in Results Review 22   BUN 60.0* 33.0* 33.0*  More results in Results Review 69.0*   Creatinine 5.2* 4.0* 4.5*  More results in Results Review 9.4*   Calcium 8.6 8.2* 8.1*  More results in Results Review 8.4*   Albumin 2.6* 2.4* 2.4*  More results in Results Review 2.9*   Phosphorus 3.8 3.1 3.4  More results in Results Review 7.0*   Magnesium  --   --   --   --  2.2   Glucose 341* 443* 325*  More results in Results Review 401*   EGFR 10.0 13.6 11.8  More results in Results Review 5.1   More results in Results Review = values in this interval not displayed.         Recent Labs   Lab 01/06/21  0319   Urine Type Urine, Clean Ca   Color, UA Yellow   Clarity, UA Hazy   Specific Gravity UA 1.014   Urine pH 7.0   Nitrite, UA Negative   Ketones UA Negative   Urobilinogen, UA Negative   Bilirubin, UA Negative   Blood, UA Negative   RBC, UA 3 - 5   WBC, UA 0 - 5           Signed by: Renard Hamper, MD

## 2021-01-09 NOTE — Discharge Summary (Signed)
[facility]      Patient: [name]  Admission Date: 01/05/2021   DOB: [birth year]  Discharge Date: 01/09/2021    MRN: [id number]  Discharge Attending:Davetta Olliff Karmen Stabs, MD     Referring Physician: Merryl Hacker, None, MD  PCP: Pcp, None, MD       DISCHARGE SUMMARY     Discharge Information   Admission Diagnosis:   Pulmonary edema    Discharge Diagnosis:   Active Hospital Problems    Diagnosis    Pulmonary edema    Hypervolemia    Elevated troponin    Chest pain    Nausea vomiting and diarrhea    Breast pain, left    Syncope    Shortness of breath    Hypertensive urgency    Uncontrolled type 2 diabetes mellitus with hyperglycemia    Hyperlipidemia    Thrombocytopenia    Generalized abdominal pain    ESRD (end stage renal disease) on dialysis        Admission Condition: Guarded  Discharge Condition: Improving  Consultants: Nephrology, cardiology  Functional Status: Able to dangle at bedside with assistance  Discharged to: SNF    Discharge Medications:     Medication List        START taking these medications      acetaminophen 325 MG tablet  Commonly known as: TYLENOL  Take 2 tablets (650 mg) by mouth every 6 (six) hours as needed for Pain or Fever     amoxicillin-clavulanate 500-125 MG per tablet  Commonly known as: AUGMENTIN  Take 1 tablet by mouth daily for 7 days     doxycycline 100 MG capsule  Commonly known as: MONODOX  Take 1 capsule (100 mg) by mouth every 12 (twelve) hours for 7 days     lactobacillus/streptococcus Caps  Take 1 capsule by mouth 2 (two) times daily     ondansetron 4 MG disintegrating tablet  Commonly known as: ZOFRAN-ODT  Take 1 tablet (4 mg) by mouth every 6 (six) hours as needed for Nausea            CHANGE how you take these medications      insulin aspart 100 UNIT/ML injection  Commonly known as: NovoLOG  Inject 10 Units into the skin 3 (three) times daily before meals  What changed: how much to take     [place] FlexTouch 200 UNIT/ML Sopn  Generic drug: Insulin Degludec  Inject 40 Units  into the skin nightly  What changed: how much to take            CONTINUE taking these medications      albuterol sulfate HFA 108 (90 Base) MCG/ACT inhaler  Commonly known as: PROVENTIL     amLODIPine 10 MG tablet  Commonly known as: NORVASC  Take 1 tablet (10 mg total) by mouth daily     aspirin 81 MG chewable tablet     atorvastatin 40 MG tablet  Commonly known as: LIPITOR     carvedilol 25 MG tablet  Commonly known as: COREG     gabapentin 600 MG tablet  Commonly known as: NEURONTIN  Take 1 tablet (600 mg) by mouth 2 (two) times daily     sevelamer 800 MG tablet  Commonly known as: RENVELA            STOP taking these medications      vitamin C 500 MG tablet  Commonly known as: ASCORBIC ACID  Where to Get Your Medications        Information about where to get these medications is not yet available    Ask your nurse or doctor about these medications  acetaminophen 325 MG tablet  amoxicillin-clavulanate 500-125 MG per tablet  doxycycline 100 MG capsule  gabapentin 600 MG tablet  insulin aspart 100 UNIT/ML injection  lactobacillus/streptococcus Caps  ondansetron 4 MG disintegrating tablet  Tyler Aas FlexTouch 200 UNIT/ML [facility] Course   Presentation History   As per admitting provider, "65 y.o. female with a PMH of HTN, HLD, DM2, ESRD on HD MWF presenting to the ED for shortness of breath.  Patient states she has not been to dialysis since last Monday because she has not been feeling well due to body aches, abdominal pain nausea vomiting and diarrhea.  Reports chest pain, shortness of breath and leg swelling.  States she did not take [place] 35 units last night.  Denies syncope, fever, hematemesis, hematochezia, melena, dysuria, hematuria, focal weakness"    See HPI for details.    Hospital Course (0 Days)     #Pulmonary edema  #Hypervolemia  #ESRD on HD MWF  #Hypertensive urgency  -Patient presented with shortness of breath secondary to pulmonary edema/hypervolemia from missing her  hemodialysis.  Patient also found to have hypertensive urgency with blood pressure going up to 235/112    S/p IV labetalol in the ED with improvement.  Missed dialysis.    Status post nephrology evaluation and hemodialysis with ultrafiltration on 01/05/2021, 01/06/2021 and 01/07/2021    Continue home amlodipine and Coreg.      Patient to continue her hemodialysis on Monday Wednesday Friday     #Elevated troponin  #Chest pain and shortness of breath  -Patient presenting with chest discomfort along with mild troponin elevation  -EKG reviewed, mild troponin elevation.   -Status post cardiology evaluation  -Patient's elevated troponin likely secondary to demand ischemia in the setting of hypertensive crisis, flash pulmonary edema and missed dialysis  -Continue aspirin and statin for presumed CAD  -Volume control with hemodialysis       #Abdominal pain  #Nausea vomiting diarrhea  -Likely viral etiology.  Abdomen benign on exam. Afebrile, no leukocytosis.  COVID-19 and influenza negative.  CXR with no infiltrate. CT abdomen pelvis shows anasarca otherwise no acute findings.    Patient reported significant improvement in her symptoms after fluid removal  -Continue to monitor as an outpatient        #DM with hyperglycemia  -BG 401. s/p 10 units insulin lispro in the ED.  -Currently better controlled  -Hemoglobin A1c 11.8 on 10/21/2020  -Repeat A1c on 01/07/2021 was 11.7     -Continue with Tresiba 40 units qhs, Humalog 10 units with meals and adjust as tolerated    #Left breast pain  -In the ED, patient reported left breast pain.  Per ED physician, left breast appears slightly larger than right but no induration, fluctuance or erythema.  -Status post ultrasound of left breast showing subcutaneous edema at 3:00 region of left breast.  No sonographic evidence of obvious mass.  Patient did have prominent lymph node in left axilla with cortical thickening measuring 2.0 x 1.0 x 1.6 cm    Patient to follow-up with primary care  physician as an outpatient for diagnostic mammogram    Patient's case discussed with infectious disease.  Patient with history of Acinetobacter complex CRO and MRSA wound in the  past we will give Augmentin and doxycycline to cover for Acinetobacter and MRSA as per infectious's recommendation    #?  Syncope  -Patient had possible syncopal episode working with occupational therapy   -Continue with PT/OT     #HLD  -Continue Lipitor     #Thrombocytopenia  -Appears chronic per chart review.  No evidence of bleeding.    -Monitor for now     BMI Assessment:  - Body mass index is 30.18 kg/m.  - Meets criteria for overweight given BMI between 25 and 30 due to excess calories/nutritional status. Weight loss and lifestyle modifications encouraged.    ____________________________________________________  Addendum    This patient was discharged on 01/07/2021.  Unfortunate patient's discharge was delayed due to delaying securing SNF facility.    Patient currently deciding between discharge to SNF facility versus home with home PT/OT.  Case management on board    Awaiting case management to start insurance authorization for skilled nursing facility.  [facility] willing to start authorization.      Patient will be discharged in hemodynamically stable condition once safe discharge planning has been set up       Procedures/Imaging:   CT Abd/ Pelvis without Contrast    Result Date: 01/05/2021  1. No evidence of ureteral stone or hydronephrosis. 2. Anasarca.  Elnita Maxwell, MD  01/05/2021 1:31 PM    XR Chest  AP Portable    Result Date: 01/05/2021   pulmonary edema Elyn Peers, MD  01/05/2021 10:46 AM    US Breast Left Ltd    Result Date: 01/07/2021   1. Subcutaneous edema and 3:00 region of left breast. No sonographic evidence of an obvious mass in the upper outer quadrant of left breast. 2. Prominent lymph node in left axilla with cortical thickening measuring 2.0 x 1.0 x 1.6 cm. 3. Diagnostic mammograms are recommended for a more  complete evaluation. Next CATEGORY 0: INCOMPLETE - NEED ADDITIONAL IMAGING EVALUATION. WRITTEN RESULTS PROVIDED TO THE PATIENT PER MQSA REQUIREMENTS.   BIRAD: 82 Isabelle Course, MD  01/07/2021 1:13 PM         Best Practices   Was the patient admitted with either a CHF Exacerbation or Pneumonia? no     Progress Note/Physical Exam at Discharge     Subjective: Today patient reports that she feels okay currently denies any fever chills nausea vomiting diarrhea chest pain shortness of breath reports improving left breast pain    Vitals:    01/09/21 0740 01/09/21 1213 01/09/21 1607 01/09/21 1912   BP: 153/76 122/55 130/69 149/81   Pulse: 77 68 72 73   Resp: 20 20 (!) 26 12   Temp: 97 F (36.1 C) 98.1 F (36.7 C) 98.4 F (36.9 C) 98.2 F (36.8 C)   TempSrc: Axillary Axillary Axillary Axillary   SpO2: 98% 94% 95% 95%   Weight:       Height:           GEN APPEARANCE: Normal;  A&OX3, lethargic, seen during hemodialysis  HEENT: PERLA; EOMI; Conjunctiva Clear  NECK: Supple; No bruits  CVS: Bradycardic, S1, S2; No M/G/R  LUNGS: CTAB; No Wheezes; No Rhonchi: No rales  ABD: Soft; No TTP; + Normoactive BS  EXT: No edema; Pulses 2+ and intact, AV fistula noted on left arm  Skin exam:  no pallor, erythema and mild swelling noted on left breast from 3:00 to 5:00  NEURO: CN 2-12 intact; No Focal neurological deficits  CAP REFILL:  Normal  MENTAL  STATUS:  Normal       Diagnostics     Labs/Studies Pending at Discharge: No    Last Labs   Recent Labs   Lab 01/09/21  0348 01/08/21  0335 01/07/21  0317   WBC 6.54 4.62 4.22   RBC 3.56* 3.63* 3.43*   Hgb 10.6* 11.0* 10.3*   Hematocrit 34.8 35.9 33.0*   MCV 97.8* 98.9* 96.2*   Platelets 73* 76* 75*         Recent Labs   Lab 01/09/21  0348 01/08/21  0335 01/07/21  0317 01/06/21  0319 01/05/21  1018   Sodium 140 138 140 138 136   Potassium 4.4 4.6 4.3 4.5 4.9   Chloride 103 101 104 103 100   CO2 22 29 27 25 22    BUN 60.0* 33.0* 33.0* 40.0* 69.0*   Creatinine 5.2* 4.0* 4.5* 6.3* 9.4*   Glucose  341* 443* 325* 376* 401*   Calcium 8.6 8.2* 8.1* 7.7* 8.4*   Magnesium  --   --   --   --  2.2         Microbiology Results (last 15 days)       Procedure Component Value Units Date/Time    COVID-19 (SARS-CoV-2) and Influenza A/B, NAA (Liat Rapid)- Admission [[id number]] Collected: 01/05/21 1040    Order Status: Completed Specimen: Culturette from Nasopharyngeal Updated: 01/05/21 1239     Purpose of COVID testing Diagnostic -[facility]I     SARS-CoV-2 Specimen Source Nasal Swab     SARS CoV 2 Overall Result Not Detected     Comment: __________________________________________________  -A result of "Detected" indicates POSITIVE for the    presence of SARS CoV-2 RNA  -A result of "Not Detected" indicates NEGATIVE for the    presence of SARS CoV-2 RNA  __________________________________________________________  Test performed using the Roche cobas Liat SARS-CoV-2 assay. This assay is  only for use under the Food and Drug Administration's Emergency Use  Authorization. This is a real-time RT-PCR assay for the qualitative  detection of SARS-CoV-2 RNA. Viral nucleic acids may persist in vivo,  independent of viability. Detection of viral nucleic acid does not imply the  presence of infectious virus, or that virus nucleic acid is the cause of  clinical symptoms. Negative results do not preclude SARS-CoV-2 infection and  should not be used as the sole basis for diagnosis, treatment or other  patient management decisions. Negative results must be combined with  clinical observations, patient history, and/or epidemiological information.  Invalid results may be due to inhibiting substances in the specimen and  recollection should occur. Please see Fact Sheets for patients and providers  located:  https://www.benson-chung.com/          Influenza A Not Detected     Influenza B Not Detected     Comment: Test performed using the Roche cobas Liat SARS-CoV-2 & Influenza A/B assay.  This assay is only for use under the Food and  Drug Administration's  Emergency Use Authorization. This is a multiplex real-time RT-PCR assay  intended for the simultaneous in vitro qualitative detection and  differentiation of SARS-CoV-2, influenza A, and influenza B virus RNA. Viral  nucleic acids may persist in vivo, independent of viability. Detection of  viral nucleic acid does not imply the presence of infectious virus, or that  virus nucleic acid is the cause of clinical symptoms. Negative results do  not preclude SARS-CoV-2, influenza A, and/or influenza B infection and  should not be used as the  sole basis for diagnosis, treatment or other  patient management decisions. Negative results must be combined with  clinical observations, patient history, and/or epidemiological information.  Invalid results may be due to inhibiting substances in the specimen and  recollection should occur. Please see Fact Sheets for patients and providers  located: http://olson-hall.info/.         Narrative:      o Collect and clearly label specimen type:  o PREFERRED-Upper respiratory specimen: One Nasal Swab in  Transport Media.  o Hand deliver to laboratory ASAP  Diagnostic -PUI             Patient Instructions   Discharge Diet: Diabetic renal  Discharge Activity: As tolerated    Follow Up Appointment:   Follow-up Information       Pcp, None, MD .                              Time spent examining patient, discussing with patient/family regarding hospital course, chart review, reconciling medications and discharge planning: > 35 minutes.    Signed,  Marya Landry, MD    7:39 PM 01/09/2021

## 2021-01-09 NOTE — Plan of Care (Signed)
Patient is a&ox4. No complaints of pain. Vss. Cleaned fistula per order. +for thrill and bruit. Ambulate to bathroom with assistance. Plan for d/c to snf. Will continue to monitor.     Problem: Safety  Goal: Patient will be free from injury during hospitalization  Outcome: Progressing  Goal: Patient will be free from infection during hospitalization  Outcome: Progressing     Problem: Pain  Goal: Pain at adequate level as identified by patient  Outcome: Progressing     Problem: Side Effects from Pain Analgesia  Goal: Patient will experience minimal side effects of analgesic therapy  Outcome: Progressing     Problem: Discharge Barriers  Goal: Patient will be discharged home or other facility with appropriate resources  Outcome: Progressing     Problem: Psychosocial and Spiritual Needs  Goal: Demonstrates ability to cope with hospitalization/illness  Outcome: Progressing     Problem: Renal Instability  Goal: Fluid and electrolyte balance are achieved/maintained  Outcome: Progressing     Problem: Patient Receiving Advanced Renal Therapies  Goal: Therapy access site remains intact  Outcome: Progressing     Problem: Moderate/High Fall Risk Score >5  Goal: Patient will remain free of falls  Outcome: Progressing

## 2021-01-10 LAB — RENAL FUNCTION PANEL
Albumin: 2.5 g/dL — ABNORMAL LOW (ref 3.5–5.0)
Anion Gap: 14 (ref 5.0–15.0)
BUN: 82 mg/dL — ABNORMAL HIGH (ref 7.0–21.0)
CO2: 19 mEq/L (ref 17–29)
Calcium: 8.7 mg/dL (ref 8.5–10.5)
Chloride: 103 mEq/L (ref 99–111)
Creatinine: 6.3 mg/dL — ABNORMAL HIGH (ref 0.4–1.0)
Glucose: 318 mg/dL — ABNORMAL HIGH (ref 70–100)
Phosphorus: 4.4 mg/dL (ref 2.3–4.7)
Potassium: 4.6 mEq/L (ref 3.5–5.3)
Sodium: 136 mEq/L (ref 135–145)

## 2021-01-10 LAB — CBC
Absolute NRBC: 0 10*3/uL (ref 0.00–0.00)
Hematocrit: 31.6 % — ABNORMAL LOW (ref 34.7–43.7)
Hgb: 9.9 g/dL — ABNORMAL LOW (ref 11.4–14.8)
MCH: 30.4 pg (ref 25.1–33.5)
MCHC: 31.3 g/dL — ABNORMAL LOW (ref 31.5–35.8)
MCV: 96.9 fL — ABNORMAL HIGH (ref 78.0–96.0)
MPV: 13.7 fL — ABNORMAL HIGH (ref 8.9–12.5)
Nucleated RBC: 0 /100 WBC (ref 0.0–0.0)
Platelets: 80 10*3/uL — ABNORMAL LOW (ref 142–346)
RBC: 3.26 10*6/uL — ABNORMAL LOW (ref 3.90–5.10)
RDW: 13 % (ref 11–15)
WBC: 6.88 10*3/uL (ref 3.10–9.50)

## 2021-01-10 LAB — GLUCOSE WHOLE BLOOD - POCT
Whole Blood Glucose POCT: 235 mg/dL — ABNORMAL HIGH (ref 70–100)
Whole Blood Glucose POCT: 208 mg/dL — ABNORMAL HIGH (ref 70–100)
Whole Blood Glucose POCT: 183 mg/dL — ABNORMAL HIGH (ref 70–100)
Whole Blood Glucose POCT: 162 mg/dL — ABNORMAL HIGH (ref 70–100)

## 2021-01-10 LAB — GFR: EGFR: 8

## 2021-01-10 MED ORDER — SODIUM CHLORIDE 0.9 % IV BOLUS
250.0000 mL | INTRAVENOUS | Status: AC | PRN
Start: 2021-01-10 — End: 2021-01-10

## 2021-01-10 MED ORDER — ALBUMIN HUMAN 25 % IV SOLN
100.0000 mL | INTRAVENOUS | Status: AC | PRN
Start: 2021-01-10 — End: 2021-01-10

## 2021-01-10 MED ORDER — SODIUM CHLORIDE 0.9 % IV BOLUS
100.0000 mL | INTRAVENOUS | Status: AC | PRN
Start: 2021-01-10 — End: 2021-01-10

## 2021-01-10 NOTE — Plan of Care (Signed)
Problem: Renal Instability  Goal: Fluid and electrolyte balance are achieved/maintained  Flowsheets (Taken 01/10/2021 1010)  Fluid and electrolyte balance are achieved/maintained:   Monitor intake and output every shift   Monitor/assess lab values and report abnormal values   Monitor daily weight   Assess for confusion/personality changes   Observe for cardiac arrhythmias     Problem: Patient Receiving Advanced Renal Therapies  Goal: Therapy access site remains intact  Flowsheets (Taken 01/10/2021 1010)  Therapy access site remains intact: Assess therapy access site

## 2021-01-10 NOTE — Plan of Care (Signed)
Pt alert and oriented x4,vitals wdl except with POCT in 280,pt kept asking for food,pt edu to slow down dt elevated BS,pt verbalizes understood.pt ambulated to bathroom with walker and standby assist.pt slept throughout the night.no acute distress overnight.care continues  Problem: Safety  Goal: Patient will be free from injury during hospitalization  Outcome: Progressing  Flowsheets (Taken 01/07/2021 0607 by Shirleen Schirmer, RN)  Patient will be free from injury during hospitalization:   Assess patient's risk for falls and implement fall prevention plan of care per policy   Provide and maintain safe environment   Use appropriate transfer methods   Ensure appropriate safety devices are available at the bedside   Hourly rounding     Problem: Safety  Goal: Patient will be free from infection during hospitalization  Outcome: Progressing  Flowsheets (Taken 01/10/2021 0659)  Free from Infection during hospitalization:   Assess and monitor for signs and symptoms of infection   Monitor all insertion sites (i.e. indwelling lines, tubes, urinary catheters, and drains)   Monitor lab/diagnostic results     Problem: Renal Instability  Goal: Fluid and electrolyte balance are achieved/maintained  Outcome: Progressing  Flowsheets (Taken 01/08/2021 1024 by Mickel Baas, RN)  Fluid and electrolyte balance are achieved/maintained:   Monitor intake and output every shift   Provide adequate hydration   Assess and reassess fluid and electrolyte status   Observe for seizure activity and initiate seizure precautions if indicated   Monitor for muscle weakness   Monitor daily weight   Assess for confusion/personality changes   Observe for cardiac arrhythmias   Follow fluid restrictions/IV/PO parameters   Monitor/assess lab values and report abnormal values

## 2021-01-10 NOTE — Progress Notes (Signed)
01/10/21 0953   Bedside Nurse Communication   Name of bedside RN - pre dialysis [facility]   Treatment Initiation- With Dialysis Precautions   Time Out/Safety Check Completed Yes   Consent for HD signed for this hospitalization (Date) 10/20/20   Consent for HD signed for this hospitalization (Time) 1545   Preferred language Yes   Complication waiver N   Blood Consent Verified N/A   Dialysis Precautions All Connections Secured   Dialysis Treatment Type Routine   Is patient diabetic? Yes   RO/Hemodialysis Architectural technologist   Is Total Chlorine less than 0.1 ppm? Yes   Orignial Total Chlorine Testing Time 0910   RO/Hemodialysis Facilities manager Number [id number]    Machine Serial Number [id number]   RO # 27   RO Serial # [id number]   pH 7.2   Pressure Test Verified Yes   Alarms Verified Passed   Machine Temperature 98.6 F (37 C)   Alarms Verified Yes   Na+ mEq (Machine) 138 mEq   Bicarb mEq (Machine) 35 mEq   Hemodialysis Conductivity (Machine) 13.5   Hemodialysis Conductivity (Meter) 13.7   Dialyzer Lot Number [id number]   Tubing Lot Number [id number]   RO Machine Log Completed Yes   Hepatitis Status   HBsAg (Antigen) Result Negative   HBsAg Date Drawn 01/05/21   HBsAg Repeat Draw Due Date 02/02/21   Dialysis Weight   Pre-Treatment Weight (Kg) 94.8   Vitals   Temp 98.4 F (36.9 C)   Heart Rate 75   Resp Rate 20   BP 134/72   Assessment   Mental Status Alert;Oriented;Cooperative   Cardiac (WDL) WDL   Cardiac Regularity Regular   Cardiac Rhythm Normal Sinus Rhythm   Respiratory  WDL   Respiratory Pattern Regular   Bilateral Breath Sounds Clear   R Breath Sounds Clear   L Breath Sounds Clear   Cough Spontaneous   Edema  WDL   Generalized Edema Non Pitting Edema   Facial None   Sacral UTA   RLE Edema Non Pitting Edema   LLE Edema Non Pitting Edema   General Skin Color Appropriate for ethnicity   Skin Condition/Temp Warm;Dry   Gastrointestinal (WDL) WDL   Abdomen Inspection Soft;Rounded   GI Symptoms None   Mobility  Bed   Pain Assessment   Charting Type Assessment   Pain Scale Used Numeric Scale (0-10)   Numeric Pain Scale   Pain Score 0   POSS Score 1   Hemodialysis Comments   Pre-Hemodialysis Comments HD started, safety check and timeouts done

## 2021-01-10 NOTE — OT Progress Note (Signed)
Occupational Therapy Treatment  [name]        Post Acute Care Therapy Recommendations   Discharge Recommendations:  Home with supervision, Home with home health OT, Home with home health PT, Other(Comment) (Supervision and A for OOB anctivity and ADLs. Pt states she lives w/ son and has a caregiver 8 hours a day, open to increasing hours upon arrival home. Daughter visits and helps on the weekend.)    If recommended discharge disposition is not available, patient will require the following assistance: SNF    DME needs IF patient is discharging home: No additional equipment/DME recommended at this time    Therapy discharge recommendations may change with patient status.  Please refer to most recent note for up-to-date recommendations.    Is PT evaluation indicated at this time? This patient is already on PT caseload.    Unit: [place] [facility] INTERMEDIATE CARE  Bed: S2876/O1157-W      ___________________________________________________    Time of treatment:  Time Calculation  OT Received On: 01/10/21  Start Time: 6203  Stop Time: 1900  Time Calculation (min): 25 min             Chart Review and Collaboration with Care Team: 5 minutes, not included in above time.    OT Visit Number: 2      Precautions and Contraindications:    Precautions  Weight Bearing Status: no restrictions  Other Precautions: Contact MRSA    Personal Protective Equipment (PPE)  gloves, procedure gown, and procedure mask    Updated Labs:  Lab Results   Component Value Date/Time    HGB 9.9 (L) 01/10/2021 03:32 AM    HCT 31.6 (L) 01/10/2021 03:32 AM    K 4.6 01/10/2021 03:32 AM    NA 136 01/10/2021 03:32 AM    INR 1.1 07/01/2019 10:41 PM    TROPI 93.3 (A) 01/05/2021 03:02 PM    TROPI -4.5 01/05/2021 03:02 PM    TROPI 97.8 (A) 01/05/2021 11:21 AM    TROPI 0.08 (H) 11/12/2020 04:40 AM    TROPI 0.35 ([place]) 10/21/2020 03:53 AM    TROPI 0.42 ([place]) 10/20/2020 11:57 AM    TROPI 0.02 01/21/2020 03:19 AM    TROPI 0.01 03/18/2006 06:10 PM        All imaging reviewed, please see chart for details.    Subjective: "I'd like to spend Christmas with my granddaughter."          Patient's medical condition is appropriate for Occupational Therapy intervention at this time.  Patient is agreeable to participation in the therapy session. Nursing clears patient for therapy.  Pain Assessment  Pain Assessment: Wong-Baker FACES  Pain Score: 5-moderate pain  Pain Location: Knee  Pain Orientation: Left  Pain Intervention(s): Repositioned;Emotional support      Objective:  Observation of Patient/Vital Signs:  Stable      Cognition/Neuro Status  Arousal/Alertness: Appropriate responses to stimuli  Attention Span: Appears intact  Orientation Level: Oriented X4  Memory: Appears intact  Following Commands: Follows all commands and directions without difficulty  Safety Awareness: minimal verbal instruction  Insights: Fully aware of deficits  Problem Solving: Assistance required to identify errors made;minimal assistance  Comments: Pt presents w/ increased fatigue, however is able to complete functional mobility tasks and ADL tasks. Pt reports occ pain in the Left Knee during ambulation.  Behavior: calm;cooperative;attentive    Functional Mobility  Scooting Transfers: Stand by Assist;additional time;grab bars  Supine to Sit Transfers: Stand by Assist;additional time;grab  bars  Sit to Supine Transfers: Supervision;additional time;grab bars  Sit to Stand Transfers: Stand by Assist;additional time (Cues for hand placement)  Stand to Sit Transfers: Stand by Assist;additional time (Cues for hand placement)  Toilet Transfers: Stand by Assist;additional time;grab bars  Functional Mobility/Ambulation: Contact Guard Assist (due to L knee pain.)    Self-care and Home Management  Grooming: Stand by Assist;standing at sink;wash/dry hands  Toileting: Supervision;verbal prompting;supervison/safety;increased time to complete  Functional Transfers: Stand by Assist;verbal  prompting;supervision/safety;increased time to complete;toilet transfer        Educated the Patient to role of occupational therapy, plan of care, goals of therapy and safety with mobility and ADLs, energy conservation techniques with verbalized understanding  and demonstrated understanding.     Patient left in bed with alarm and all other medical equipment in place and call bell and all personal items/needs within reach.  RN notified of session outcome.         Assessment: Pt is progressing towards OT goals. Main limitations are decreased activity endurance, decreased functional mobility, weakness and fatigue limiting participation in functional mobility and ADL tasks. Pt would benefit from continued OT services in order to maximize potential. Therapist continues to recommend [place] to Home with supervision, Home with home health OT, Home with home health PT, Other(Comment) (Supervision and A for OOB anctivity and ADLs. Pt states she lives w/ son and has a caregiver 8 hours a day, open to increasing hours upon arrival home. Daughter visits and helps on the weekend.)        OT frequency is being decreased to 1-2x/week due to change in discharge destination requiring corresponding frequency update.      PMP Activity: Step 6 - Walks in Room  Distance Walked (ft) (Step 6,7): 30 Feet (To and from bathroom.)      Plan:  OT Frequency Recommended: 1-2x/wk  Goal Formulation: Patient      Time For Goal Achievement: by time of discharge  ADL Goals  Patient will toilet: Minimal Assist, Goal met, New goal, Independent  Other Goal: Pt will sit EOB to prepare for OOB ADL tasks with SPV (Goal met)  Mobility and Transfer Goals  Pt will perform functional transfers: Contact Guard Assist, with rolling walker, Goal met, Not met, Independent                       Continue plan of care.      Theresia Lo, COTA/L    [name]  [phone]    01/10/2021  7:36 PM    [facility]  Patient: [name] MRN#: [id number]   Unit: [place] [facility] INTERMEDIATE CARE Bed: O1224/M2500-B

## 2021-01-10 NOTE — Progress Notes (Signed)
01/10/21 1306   CM Review   Acknowledgment of Outpatient/Observation Observation letter given       CMA made an outreach to daughter. CMA notified/informed daughter about patient hospital observation status. CMA informed daughter that the observation letter will be mailed out to the home address.

## 2021-01-10 NOTE — Progress Notes (Signed)
Arrived at Pt room 2807, contact isolation observed, pt alert oriented x4, no signs of SOB, no pain reported as of now, HD started via Left AVF, bruit and thrill present, report was received from primary nurse.       01/10/21 1004   Vitals   Temp 98.4 F (36.9 C)   Heart Rate 75   Resp Rate 22   BP 134/72   SpO2 96 %   Machine Metrics   $Treatment Started/Capturing Charge Yes   Blood Flow Rate (mL/min) 350 mL/min   Arterial Pressure (mmHg) 12 mmHg   Venous Pressure (mmHg) 56   Dialysate Flow Rate (mL/min) 700 mL/min   Transmembrane Pressure (mmHg) 37 mmHg   Ultrafiltration Rate (mL/Hr) 970 mL/hr   Dialysate K (mEq/L) 2 mEq/L   Dialysate CA (mEq/L) 2.5 mEq/L   Hemodialysis Comments   Arteriovenous Lines Secure Yes   Comments HD started, safety check done

## 2021-01-10 NOTE — Discharge Summary (Signed)
[facility]      Patient: [name]  Admission Date: 01/05/2021   DOB: Aug 28, 1955  Discharge Date: 01/10/2021    MRN: [id number]  Discharge Attending:Darice Vicario Karmen Stabs, MD     Referring Physician: Merryl Hacker, None, MD  PCP: Pcp, None, MD       DISCHARGE SUMMARY     Discharge Information   Admission Diagnosis:   Pulmonary edema    Discharge Diagnosis:   Active Hospital Problems    Diagnosis    Pulmonary edema    Hypervolemia    Elevated troponin    Chest pain    Nausea vomiting and diarrhea    Breast pain, left    Syncope    Shortness of breath    Hypertensive urgency    Uncontrolled type 2 diabetes mellitus with hyperglycemia    Hyperlipidemia    Thrombocytopenia    Generalized abdominal pain    ESRD (end stage renal disease) on dialysis        Admission Condition: Guarded  Discharge Condition: Improving  Consultants: Nephrology, cardiology  Functional Status: Able to dangle at bedside with assistance  Discharged to: SNF    Discharge Medications:     Medication List        START taking these medications      acetaminophen 325 MG tablet  Commonly known as: TYLENOL  Take 2 tablets (650 mg) by mouth every 6 (six) hours as needed for Pain or Fever     amoxicillin-clavulanate 500-125 MG per tablet  Commonly known as: AUGMENTIN  Take 1 tablet by mouth daily for 7 days     doxycycline 100 MG capsule  Commonly known as: MONODOX  Take 1 capsule (100 mg) by mouth every 12 (twelve) hours for 7 days     lactobacillus/streptococcus Caps  Take 1 capsule by mouth 2 (two) times daily     ondansetron 4 MG disintegrating tablet  Commonly known as: ZOFRAN-ODT  Take 1 tablet (4 mg) by mouth every 6 (six) hours as needed for Nausea            CHANGE how you take these medications      insulin aspart 100 UNIT/ML injection  Commonly known as: NovoLOG  Inject 10 Units into the skin 3 (three) times daily before meals  What changed: how much to take     [place] FlexTouch 200 UNIT/ML Sopn  Generic drug: Insulin Degludec  Inject 45 Units  into the skin nightly  What changed: how much to take            CONTINUE taking these medications      albuterol sulfate HFA 108 (90 Base) MCG/ACT inhaler  Commonly known as: PROVENTIL     amLODIPine 10 MG tablet  Commonly known as: NORVASC  Take 1 tablet (10 mg total) by mouth daily     aspirin 81 MG chewable tablet     atorvastatin 40 MG tablet  Commonly known as: LIPITOR     carvedilol 25 MG tablet  Commonly known as: COREG     gabapentin 600 MG tablet  Commonly known as: NEURONTIN  Take 1 tablet (600 mg) by mouth 2 (two) times daily     sevelamer 800 MG tablet  Commonly known as: RENVELA            STOP taking these medications      vitamin C 500 MG tablet  Commonly known as: ASCORBIC ACID  Where to Get Your Medications        Information about where to get these medications is not yet available    Ask your nurse or doctor about these medications  acetaminophen 325 MG tablet  amoxicillin-clavulanate 500-125 MG per tablet  doxycycline 100 MG capsule  gabapentin 600 MG tablet  insulin aspart 100 UNIT/ML injection  lactobacillus/streptococcus Caps  ondansetron 4 MG disintegrating tablet  Tyler Aas FlexTouch 200 UNIT/ML [facility] Course   Presentation History   As per admitting provider, "65 y.o. female with a PMH of HTN, HLD, DM2, ESRD on HD MWF presenting to the ED for shortness of breath.  Patient states she has not been to dialysis since last Monday because she has not been feeling well due to body aches, abdominal pain nausea vomiting and diarrhea.  Reports chest pain, shortness of breath and leg swelling.  States she did not take [place] 35 units last night.  Denies syncope, fever, hematemesis, hematochezia, melena, dysuria, hematuria, focal weakness"    See HPI for details.    Hospital Course (0 Days)     #Pulmonary edema  #Hypervolemia  #ESRD on HD MWF  #Hypertensive urgency  -Patient presented with shortness of breath secondary to pulmonary edema/hypervolemia from missing her  hemodialysis.  Patient also found to have hypertensive urgency with blood pressure going up to 235/112    S/p IV labetalol in the ED with improvement.  Missed dialysis.    Status post nephrology evaluation and hemodialysis with ultrafiltration on 01/05/2021, 01/06/2021 and 01/07/2021    Continue home amlodipine and Coreg.      Patient to continue her hemodialysis on Monday Wednesday Friday     #Elevated troponin  #Chest pain and shortness of breath  -Patient presenting with chest discomfort along with mild troponin elevation  -EKG reviewed, mild troponin elevation.   -Status post cardiology evaluation  -Patient's elevated troponin likely secondary to demand ischemia in the setting of hypertensive crisis, flash pulmonary edema and missed dialysis  -Continue aspirin and statin for presumed CAD  -Volume control with hemodialysis       #Abdominal pain  #Nausea vomiting diarrhea  -Likely viral etiology.  Abdomen benign on exam. Afebrile, no leukocytosis.  COVID-19 and influenza negative.  CXR with no infiltrate. CT abdomen pelvis shows anasarca otherwise no acute findings.    Patient reported significant improvement in her symptoms after fluid removal  -Continue to monitor as an outpatient        #DM with hyperglycemia  -BG 401. s/p 10 units insulin lispro in the ED.  -Currently better controlled  -Hemoglobin A1c 11.8 on 10/21/2020  -Repeat A1c on 01/07/2021 was 11.7     -Continue with Tresiba 40 units qhs, Humalog 10 units with meals and adjust as tolerated    #Left breast pain  -In the ED, patient reported left breast pain.  Per ED physician, left breast appears slightly larger than right but no induration, fluctuance or erythema.  -Status post ultrasound of left breast showing subcutaneous edema at 3:00 region of left breast.  No sonographic evidence of obvious mass.  Patient did have prominent lymph node in left axilla with cortical thickening measuring 2.0 x 1.0 x 1.6 cm    Patient to follow-up with primary care  physician as an outpatient for diagnostic mammogram    Patient's case discussed with infectious disease.  Patient with history of Acinetobacter complex CRO and MRSA wound in the  past we will give Augmentin and doxycycline to cover for Acinetobacter and MRSA as per infectious's recommendation    #?  Syncope  -Patient had possible syncopal episode working with occupational therapy   -Continue with PT/OT     #HLD  -Continue Lipitor     #Thrombocytopenia  -Appears chronic per chart review.  No evidence of bleeding.    -Monitor for now     BMI Assessment:  - Body mass index is 30.18 kg/m.  - Meets criteria for overweight given BMI between 25 and 30 due to excess calories/nutritional status. Weight loss and lifestyle modifications encouraged.    ____________________________________________________  Addendum    This patient was discharged on 01/07/2021.  Unfortunate patient's discharge was delayed due to delaying securing SNF facility.    Patient currently deciding between discharge to SNF facility versus home with home PT/OT.  Case management on board    Awaiting case management to start insurance authorization for skilled nursing facility.  [facility] willing to take patient    Patient will be discharged in hemodynamically stable condition once safe discharge planning has been set up       Procedures/Imaging:   CT Abd/ Pelvis without Contrast    Result Date: 01/05/2021  1. No evidence of ureteral stone or hydronephrosis. 2. Anasarca.  Elnita Maxwell, MD  01/05/2021 1:31 PM    XR Chest  AP Portable    Result Date: 01/05/2021   pulmonary edema Elyn Peers, MD  01/05/2021 10:46 AM    US Breast Left Ltd    Result Date: 01/07/2021   1. Subcutaneous edema and 3:00 region of left breast. No sonographic evidence of an obvious mass in the upper outer quadrant of left breast. 2. Prominent lymph node in left axilla with cortical thickening measuring 2.0 x 1.0 x 1.6 cm. 3. Diagnostic mammograms are recommended for a more complete  evaluation. Next CATEGORY 0: INCOMPLETE - NEED ADDITIONAL IMAGING EVALUATION. WRITTEN RESULTS PROVIDED TO THE PATIENT PER MQSA REQUIREMENTS.   BIRAD: 56 Isabelle Course, MD  01/07/2021 1:13 PM         Best Practices   Was the patient admitted with either a CHF Exacerbation or Pneumonia? no     Progress Note/Physical Exam at Discharge     Subjective: Today patient reports that she feels okay currently denies any fever chills nausea vomiting diarrhea chest pain shortness of breath reports improving left breast pain    Vitals:    01/09/21 2100 01/09/21 2329 01/10/21 0329 01/10/21 0737   BP: 123/73 144/76 129/64 113/41   Pulse: 68 69 68 69   Resp:  22 17 (!) 24   Temp:  98 F (36.7 C) 98.5 F (36.9 C) 98.5 F (36.9 C)   TempSrc:  Oral Oral Oral   SpO2:  96% 97% 98%   Weight:   94.8 kg (208 lb 15.9 oz)    Height:           GEN APPEARANCE: Normal;  A&OX3,   HEENT: PERLA; EOMI; Conjunctiva Clear  NECK: Supple; No bruits  CVS: RRR, S1, S2; No M/G/R  LUNGS: CTAB; No Wheezes; No Rhonchi: No rales  ABD: Soft; No TTP; + Normoactive BS  EXT: No edema; Pulses 2+ and intact, AV fistula noted on left arm  Skin exam:  no pallor, erythema and mild swelling noted on left breast from 3:00 to 5:00  NEURO: CN 2-12 intact; No Focal neurological deficits  CAP REFILL:  Normal  MENTAL STATUS:  Normal  Diagnostics     Labs/Studies Pending at Discharge: No    Last Labs   Recent Labs   Lab 01/10/21  0332 01/09/21  0348 01/08/21  0335   WBC 6.88 6.54 4.62   RBC 3.26* 3.56* 3.63*   Hgb 9.9* 10.6* 11.0*   Hematocrit 31.6* 34.8 35.9   MCV 96.9* 97.8* 98.9*   Platelets 80* 73* 76*         Recent Labs   Lab 01/10/21  0332 01/09/21  0348 01/08/21  0335 01/07/21  0317 01/06/21  0319 01/05/21  1018   Sodium 136 140 138 140 138 136   Potassium 4.6 4.4 4.6 4.3 4.5 4.9   Chloride 103 103 101 104 103 100   CO2 19 22 29 27 25 22    BUN 82.0* 60.0* 33.0* 33.0* 40.0* 69.0*   Creatinine 6.3* 5.2* 4.0* 4.5* 6.3* 9.4*   Glucose 318* 341* 443* 325* 376* 401*    Calcium 8.7 8.6 8.2* 8.1* 7.7* 8.4*   Magnesium  --   --   --   --   --  2.2         Microbiology Results (last 15 days)       Procedure Component Value Units Date/Time    COVID-19 (SARS-CoV-2) and Influenza A/B, NAA (Liat Rapid)- Admission [[id number]] Collected: 01/05/21 1040    Order Status: Completed Specimen: Culturette from Nasopharyngeal Updated: 01/05/21 1239     Purpose of COVID testing Diagnostic -PUI     SARS-CoV-2 Specimen Source Nasal Swab     SARS CoV 2 Overall Result Not Detected     Comment: __________________________________________________  -A result of "Detected" indicates POSITIVE for the    presence of SARS CoV-2 RNA  -A result of "Not Detected" indicates NEGATIVE for the    presence of SARS CoV-2 RNA  __________________________________________________________  Test performed using the Roche cobas [place] SARS-CoV-2 assay. This assay is  only for use under the Food and Drug Administrations Emergency Use  Authorization. This is a real-time RT-PCR assay for the qualitative  detection of SARS-CoV-2 RNA. Viral nucleic acids may persist in vivo,  independent of viability. Detection of viral nucleic acid does not imply the  presence of infectious virus, or that virus nucleic acid is the cause of  clinical symptoms. Negative results do not preclude SARS-CoV-2 infection and  should not be used as the sole basis for diagnosis, treatment or other  patient management decisions. Negative results must be combined with  clinical observations, patient history, and/or epidemiological information.  Invalid results may be due to inhibiting substances in the specimen and  recollection should occur. Please see Fact Sheets for patients and providers  located:  https://www.benson-chung.com/          Influenza A Not Detected     Influenza B Not Detected     Comment: Test performed using the Roche cobas Liat SARS-CoV-2 & Influenza A/B assay.  This assay is only for use under the Food and Drug  Administrations  Emergency Use Authorization. This is a multiplex real-time RT-PCR assay  intended for the simultaneous in vitro qualitative detection and  differentiation of SARS-CoV-2, influenza A, and influenza B virus RNA. Viral  nucleic acids may persist in vivo, independent of viability. Detection of  viral nucleic acid does not imply the presence of infectious virus, or that  virus nucleic acid is the cause of clinical symptoms. Negative results do  not preclude SARS-CoV-2, influenza A, and/or influenza B infection and  should  not be used as the sole basis for diagnosis, treatment or other  patient management decisions. Negative results must be combined with  clinical observations, patient history, and/or epidemiological information.  Invalid results may be due to inhibiting substances in the specimen and  recollection should occur. Please see Fact Sheets for patients and providers  located: http://olson-hall.info/.         Narrative:      o Collect and clearly label specimen type:  o PREFERRED-Upper respiratory specimen: One Nasal Swab in  Transport Media.  o Hand deliver to laboratory ASAP  Diagnostic -PUI             Patient Instructions   Discharge Diet: Diabetic renal  Discharge Activity: As tolerated    Follow Up Appointment:   Follow-up Information       Pcp, None, MD .                              Time spent examining patient, discussing with patient/family regarding hospital course, chart review, reconciling medications and discharge planning: > 35 minutes.    Signed,  Marya Landry, MD    8:53 AM 01/10/2021

## 2021-01-10 NOTE — Progress Notes (Addendum)
Covering SW con't to follow up on DCP/ placement needs/ needed insurance authorization.    SW s/w admissions rep at [facility]:  [name]   PH# [phone]  FAX# [phone]    SW faxed updated PT eval and clinicals to the above fax # listed.    Additionally, STAT OT eval was also requested.        DCP: [name] w/ transport need    [name]

## 2021-01-10 NOTE — Progress Notes (Signed)
[facility] PROGRESS NOTE  703-KIDNEYS    Date Time: 01/10/21 3:55 PM  Patient Name: [name]  Attending Physician: Marya Landry, MD    CC: follow-up ESRD, vol overload    Assessment:     1. ESRD on HD MWF at [facility] under Dr Corky Mull.   - Missed HD x>1 week.   3. Metabolic acidosis 2/2 missed HD; resolved   4. Anemia of CKD  5. HTN: still elevated  6. Volume Overload: better, but persisten  7. Diarrhea and Vomiting: resolved     Recommendations:     Plan for d/c to SNF eventually  (pending placement)  S/p HD today with 3l net UF  If patient still in-house, plan for routine HD on Wednesday    Case discussed with: no one    Verl Blalock, MD  [facility]  [id number] (office)    Subjective:   Comfortably resting    Review of Systems:      Deferred today    Physical Exam:     Vitals:    01/10/21 1315 01/10/21 1330 01/10/21 1340 01/10/21 1525   BP: 152/66 147/76 134/68 111/61   Pulse: 68 68 70 75   Resp: 16 20 20 20    Temp:   98.2 F (36.8 C) 98 F (36.7 C)   TempSrc:    Axillary   SpO2: 97% 98% 97% 95%   Weight:       Height:           Intake and Output Summary (Last 24 hours) at Date Time    Intake/Output Summary (Last 24 hours) at 01/10/2021 1555  Last data filed at 01/10/2021 1340  Gross per 24 hour   Intake --   Output 3000 ml   Net -3000 ml         General: resting  Cardiovascular: regular rate and rhythm  Lungs: clear to auscultation bilaterally, bilateral air entry, normal work of breathing  Abdomen: soft  Extremities: no edema, LUE AVF      Meds:      Scheduled Meds: PRN Meds:    amLODIPine, 10 mg, Oral, Daily  amoxicillin-clavulanate, 1 tablet, Oral, Daily  aspirin, 81 mg, Oral, Daily  atorvastatin, 40 mg, Oral, QHS  carvedilol, 25 mg, Oral, Q12H SCH  doxycycline, 100 mg, Oral, Q12H SCH  gabapentin, 300 mg, Oral, BID  [Held by provider] heparin (porcine), 5,000 Units, Subcutaneous, Q8H SCH  insulin glargine, 45 Units, Subcutaneous,  QHS  insulin lispro, 10 Units, Subcutaneous, TID AC  lactobacillus/streptococcus, 1 capsule, Oral, BID  sevelamer, 800 mg, Oral, TID MEALS        Continuous Infusions:   acetaminophen, 650 mg, Q6H PRN  glucagon (rDNA), 1 mg, PRN   And  dextrose, 250 mL, PRN   And  dextrose, 25 g, PRN   And  dextrose, 25 g, PRN  glucagon (rDNA), 1 mg, PRN   And  dextrose, 250 mL, PRN   And  dextrose, 25 g, PRN   And  dextrose, 25 g, PRN  melatonin, 3 mg, QHS PRN  naloxone, 0.2 mg, PRN  ondansetron, 4 mg, Q6H PRN   Or  ondansetron, 4 mg, Q6H PRN            Labs:     Recent Labs   Lab 01/10/21  0332 01/09/21  0348 01/08/21  0335   WBC 6.88 6.54 4.62   Hgb 9.9* 10.6* 11.0*   Hematocrit 31.6* 34.8 35.9  Platelets 80* 73* 76*       Recent Labs   Lab 01/10/21  0332 01/09/21  0348 01/08/21  0335 01/06/21  0319 01/05/21  1018   Sodium 136 140 138  More results in Results Review 136   Potassium 4.6 4.4 4.6  More results in Results Review 4.9   Chloride 103 103 101  More results in Results Review 100   CO2 19 22 29   More results in Results Review 22   BUN 82.0* 60.0* 33.0*  More results in Results Review 69.0*   Creatinine 6.3* 5.2* 4.0*  More results in Results Review 9.4*   Calcium 8.7 8.6 8.2*  More results in Results Review 8.4*   Albumin 2.5* 2.6* 2.4*  More results in Results Review 2.9*   Phosphorus 4.4 3.8 3.1  More results in Results Review 7.0*   Magnesium  --   --   --   --  2.2   Glucose 318* 341* 443*  More results in Results Review 401*   EGFR 8.0 10.0 13.6  More results in Results Review 5.1   More results in Results Review = values in this interval not displayed.         Recent Labs   Lab 01/06/21  0319   Urine Type Urine, Clean Ca   Color, UA Yellow   Clarity, UA Hazy   Specific Gravity UA 1.014   Urine pH 7.0   Nitrite, UA Negative   Ketones UA Negative   Urobilinogen, UA Negative   Bilirubin, UA Negative   Blood, UA Negative   RBC, UA 3 - 5   WBC, UA 0 - 5           Signed by: Verl Blalock, MD

## 2021-01-10 NOTE — Progress Notes (Signed)
HD completed x 3.5hrs, Net fluid off 3L, tolerated Tx well, no complain upon completion.lunch was given .  report was given to primary nurse.       01/10/21 1340   Treatment Summary   Time Off Machine 1340   Duration of Treatment (Hours) 3.5   Treatment Type 1:1   Dialyzer Clearance Clear   Fluid Volume Off (mL) 3600   Prime Volume (mL) 200   Rinseback Volume (mL) 400   Fluid Given: Normal Saline (mL) 0   Fluid Given: PRBC  0 mL   Fluid Given: Albumin (mL) 0   Fluid Given: Other (mL) 0   Total Fluid Given 600   Hemodialysis Net Fluid Removed 3000   Post Treatment Assessment   Post-Treatment Weight (Kg) -3   Patient Response to Treatment tolerated well   Additional Dialyzer Used 0   Vitals   Temp 98.2 F (36.8 C)   Heart Rate 70   Resp Rate 20   BP 134/68   SpO2 97 %   Assessment   Mental Status Alert;Oriented;Cooperative   Cardiac (WDL) WDL   Cardiac Regularity Regular   Cardiac Rhythm Normal Sinus Rhythm   Respiratory  WDL   Respiratory Pattern Regular   Bilateral Breath Sounds Clear   R Breath Sounds Clear   L Breath Sounds Clear   Cough Spontaneous   Edema  WDL   Generalized Edema Non Pitting Edema   Facial None   Sacral UTA   RLE Edema Non Pitting Edema   LLE Edema Non Pitting Edema   General Skin Color Appropriate for ethnicity   Skin Condition/Temp Warm;Dry   Gastrointestinal (WDL) WDL   Abdomen Inspection Soft   GI Symptoms None   Mobility Ambulatory with Assistance   Pain Assessment   Charting Type Reassessment   Pain Scale Used Numeric Scale (0-10)   Numeric Pain Scale   Pain Score 0   POSS Score 1   Education   Person taught Patient   Knowledge basis Substantial   Topics taught Fluid Management   Teaching Tools Explain   Reponse Verbalizes Understanding   Bedside Nurse Communication   Name of bedside RN - post dialysis Charlotte Sanes

## 2021-01-10 NOTE — Plan of Care (Incomplete)
Dialysis completed- 3L removed.     Problem: Safety  Goal: Patient will be free from injury during hospitalization  Outcome: Progressing  Flowsheets (Taken 01/10/2021 1410)  Patient will be free from injury during hospitalization:   Assess patient's risk for falls and implement fall prevention plan of care per policy   Provide and maintain safe environment   Use appropriate transfer methods   Ensure appropriate safety devices are available at the bedside   Include patient/ family/ care giver in decisions related to safety   Hourly rounding   Assess for patients risk for elopement and implement [place] per policy   Provide alternative method of communication if needed (communication boards, writing)     Problem: Renal Instability  Goal: Fluid and electrolyte balance are achieved/maintained  Outcome: Progressing  Flowsheets (Taken 01/10/2021 1410)  Fluid and electrolyte balance are achieved/maintained:   Monitor intake and output every shift   Monitor/assess lab values and report abnormal values   Provide adequate hydration   Monitor daily weight   Assess for confusion/personality changes   Assess and reassess fluid and electrolyte status   Observe for seizure activity and initiate seizure precautions if indicated   Observe for cardiac arrhythmias   Monitor for muscle weakness     Problem: Moderate/High Fall Risk Score >5  Goal: Patient will remain free of falls  Outcome: Progressing  Flowsheets (Taken 01/09/2021 2200 by Raquel James, RN)  High (Greater than 13):   HIGH-Consider use of low bed   HIGH-Initiate use of floor mats as appropriate   HIGH-Pharmacy to initiate evaluation and intervention per protocol

## 2021-01-11 LAB — GLUCOSE WHOLE BLOOD - POCT
Whole Blood Glucose POCT: 157 mg/dL — ABNORMAL HIGH (ref 70–100)
Whole Blood Glucose POCT: 176 mg/dL — ABNORMAL HIGH (ref 70–100)
Whole Blood Glucose POCT: 216 mg/dL — ABNORMAL HIGH (ref 70–100)
Whole Blood Glucose POCT: 311 mg/dL — ABNORMAL HIGH (ref 70–100)

## 2021-01-11 MED ORDER — SODIUM CHLORIDE 0.9 % IV BOLUS
250.0000 mL | INTRAVENOUS | Status: AC | PRN
Start: 2021-01-11 — End: 2021-01-11

## 2021-01-11 MED ORDER — SODIUM CHLORIDE 0.9 % IV BOLUS
100.0000 mL | INTRAVENOUS | Status: AC | PRN
Start: 2021-01-11 — End: 2021-01-11

## 2021-01-11 NOTE — Plan of Care (Signed)
Patient alert and oriented x4. RA. Ambulateed to RR X2 with walker. Slept through night. PRN tylenol given for c/o pain in morning. Safety measures in place per protocol.         Problem: Safety  Goal: Patient will be free from injury during hospitalization  Outcome: Progressing  Flowsheets (Taken 01/11/2021 [phone])  Patient will be free from injury during hospitalization:   Assess patient's risk for falls and implement fall prevention plan of care per policy   Provide and maintain safe environment   Use appropriate transfer methods   Ensure appropriate safety devices are available at the bedside   Include patient/ family/ care giver in decisions related to safety   [place]our[place] rounding   Assess for patients risk for elopement and implement [place] per policy   Provide alternative method of communication if needed (communication boards, writing)     Problem: Renal Instability  Goal: Fluid and electrolyte balance are achieved/maintained  Outcome: Progressing  Flowsheets (Taken 01/11/2021 0651)  Fluid and electrolyte balance are achieved/maintained:   Monitor intake and output every shift   Monitor/assess lab values and report abnormal values   Provide adequate hydration   Monitor daily weight   Assess for confusion/personality changes   Assess and reassess fluid and electrolyte status   Observe for seizure activity and initiate seizure precautions if indicated   Observe for cardiac arrhythmias   Monitor for muscle weakness     Problem: Moderate/High Fall Risk Score >5  Goal: Patient will remain free of falls  Outcome: Progressing  Flowsheets (Taken 01/11/2021 0651)  High (Greater than 13):   HIGH-Visual cue at entrance to patient's room   HIGH-Consider use of low bed   HIGH-Bed alarm on at all times while patient in bed   HIGH-Apply yellow "Fall Risk" arm band   HIGH-Initiate use of floor mats as appropriate

## 2021-01-11 NOTE — Progress Notes (Signed)
[facility] PROGRESS NOTE  703-KIDNEYS    Date Time: 01/11/21 9:45 AM  Patient Name: [name]  Attending Physician: Maricela Bo, MD    CC: follow-up ESRD, vol overload    Assessment:     1. ESRD on HD MWF at [facility] under Dr Corky Mull.   - Missed HD x>1 week.   3. Metabolic acidosis 2/2 missed HD; resolved   4. Anemia of CKD  5. HTN: still elevated  6. Volume Overload: better, but persisten  7. Diarrhea and Vomiting: resolved     Recommendations:     Additional UF today for volume   Plan for d/c to SNF eventually  (pending placement)  Keep on MWF schedule.   Next HD on Wednesday    Case discussed with: pt and HD RN    Gaylyn Lambert, MD  [facility]  [phone] (office)    Subjective:   Offers no new complains  Has significant LE swelling    Review of Systems:      Deferred today    Physical Exam:     Vitals:    01/10/21 2312 01/10/21 2340 01/11/21 0329 01/11/21 0754   BP: 117/61 124/73 122/75 105/46   Pulse: 71 72 66 66   Resp: 20  18 20    Temp: 98.5 F (36.9 C)  98 F (36.7 C) 98 F (36.7 C)   TempSrc: Oral  Oral Oral   SpO2: 95%  97% 94%   Weight:   94.8 kg (208 lb 15.9 oz)    Height:           Intake and Output Summary (Last 24 hours) at Date Time    Intake/Output Summary (Last 24 hours) at 01/11/2021 0945  Last data filed at 01/10/2021 2100  Gross per 24 hour   Intake 100 ml   Output 3000 ml   Net -2900 ml         General: resting  Cardiovascular: regular rate and rhythm  Lungs: clear to auscultation bilaterally, bilateral air entry, normal work of breathing  Abdomen: soft  Extremities: no edema, LUE AVF      Meds:      Scheduled Meds: PRN Meds:    amLODIPine, 10 mg, Oral, Daily  amoxicillin-clavulanate, 1 tablet, Oral, Daily  aspirin, 81 mg, Oral, Daily  atorvastatin, 40 mg, Oral, QHS  carvedilol, 25 mg, Oral, Q12H SCH  doxycycline, 100 mg, Oral, Q12H SCH  gabapentin, 300 mg, Oral, BID  [Held by provider] heparin (porcine), 5,000 Units,  Subcutaneous, Q8H SCH  insulin glargine, 45 Units, Subcutaneous, QHS  insulin lispro, 10 Units, Subcutaneous, TID AC  lactobacillus/streptococcus, 1 capsule, Oral, BID  sevelamer, 800 mg, Oral, TID MEALS        Continuous Infusions:   acetaminophen, 650 mg, Q6H PRN  glucagon (rDNA), 1 mg, PRN   And  dextrose, 250 mL, PRN   And  dextrose, 25 g, PRN   And  dextrose, 25 g, PRN  glucagon (rDNA), 1 mg, PRN   And  dextrose, 250 mL, PRN   And  dextrose, 25 g, PRN   And  dextrose, 25 g, PRN  melatonin, 3 mg, QHS PRN  naloxone, 0.2 mg, PRN  ondansetron, 4 mg, Q6H PRN   Or  ondansetron, 4 mg, Q6H PRN            Labs:     Recent Labs   Lab 01/10/21  0332 01/09/21  0348 01/08/21  0335  WBC 6.88 6.54 4.62   Hgb 9.9* 10.6* 11.0*   Hematocrit 31.6* 34.8 35.9   Platelets 80* 73* 76*       Recent Labs   Lab 01/10/21  0332 01/09/21  0348 01/08/21  0335 01/06/21  0319 01/05/21  1018   Sodium 136 140 138  More results in Results Review 136   Potassium 4.6 4.4 4.6  More results in Results Review 4.9   Chloride 103 103 101  More results in Results Review 100   CO2 19 22 29   More results in Results Review 22   BUN 82.0* 60.0* 33.0*  More results in Results Review 69.0*   Creatinine 6.3* 5.2* 4.0*  More results in Results Review 9.4*   Calcium 8.7 8.6 8.2*  More results in Results Review 8.4*   Albumin 2.5* 2.6* 2.4*  More results in Results Review 2.9*   Phosphorus 4.4 3.8 3.1  More results in Results Review 7.0*   Magnesium  --   --   --   --  2.2   Glucose 318* 341* 443*  More results in Results Review 401*   EGFR 8.0 10.0 13.6  More results in Results Review 5.1   More results in Results Review = values in this interval not displayed.         Recent Labs   Lab 01/06/21  0319   Urine Type Urine, Clean Ca   Color, UA Yellow   Clarity, UA Hazy   Specific Gravity UA 1.014   Urine pH 7.0   Nitrite, UA Negative   Ketones UA Negative   Urobilinogen, UA Negative   Bilirubin, UA Negative   Blood, UA Negative   RBC, UA 3 - 5   WBC, UA 0 - 5            Signed by: Gaylyn Lambert, MD

## 2021-01-11 NOTE — Progress Notes (Signed)
Established HD patient at [facility], MWF.  Copy of H&P faxed,  Write to coordinate [facility] when pt is ready for d/c.     Freida Busman, HD Care Coordinator  [phone]

## 2021-01-11 NOTE — Progress Notes (Signed)
Arrived via bed. Pt. For 2 hr UF for fluid. Alert and oriented x3. Lungs with scattered rhonchi. No acute sob noted. Sat 93% on RA. Generalized non pitting edema. Good bruit and thrill in LAF. No c/o pain. Report received from [place]   01/11/21 1500   Dialysis Weight   Pre-Treatment Weight (Kg) 97.1   Scale Type ICU Bed Scale   Vitals   Temp 97.8 F (36.6 C)   Heart Rate 67   Resp Rate 20   BP (!) 104/26   SpO2 93 %   Assessment   Mental Status Alert;Oriented   Cardiac (WDL) WDL   Cardiac Regularity Regular   Cardiac Rhythm Normal Sinus Rhythm   Respiratory  X   Respiratory Pattern Regular;Easy   Bilateral Breath Sounds Rhonchi   Edema  X   Generalized Edema Non Pitting Edema   RLE Edema Non Pitting Edema   LLE Edema Non Pitting Edema   General Skin Color Appropriate for ethnicity   Skin Condition/Temp Warm   Gastrointestinal (WDL) WDL   Abdomen Inspection Distended   GI Symptoms None   Mobility Ambulatory with Assistance   Pain Assessment   Charting Type Assessment   Pain Scale Used Numeric Scale (0-10)   Numeric Pain Scale   Pain Score 0   POSS Score 1   Hemodialysis Comments   Pre-Hemodialysis Comments time out done

## 2021-01-11 NOTE — Progress Notes (Signed)
Uf of 2.4 liters. Tolerated well. Pt. Alert and oriented x3. Lungs diminshed. No sob noted. No c/o pain. LAF worked well. Hemostasis achieved. For dialysis again tomorrow. Report called to Lindley Magnus RN   01/11/21 1709   Treatment Summary   Time Off Machine 1715   Duration of Treatment (Hours) 2   Treatment Type 2:1   Dialyzer Clearance Moderately streaked   Fluid Volume Off (mL) 2800   Prime Volume (mL) 200   Rinseback Volume (mL) 200   Fluid Given: Normal Saline (mL) 0   Fluid Given: PRBC  0 mL   Fluid Given: Albumin (mL) 0   Fluid Given: Other (mL) 0   Total Fluid Given 400   Hemodialysis Net Fluid Removed 2400   Post Treatment Assessment   Post-Treatment Weight (Kg) 94.7   Patient Response to Treatment TOLERATED WELL   Vitals   Temp 97.8 F (36.6 C)   Heart Rate 76   Resp Rate 18   BP 169/70   Assessment   Mental Status Alert;Oriented;Cooperative   Cardiac (WDL) WDL   Respiratory  WDL   Respiratory Pattern Regular;Easy   Bilateral Breath Sounds Diminished   Edema  X   Generalized Edema Non Pitting Edema   General Skin Color Appropriate for ethnicity   Skin Condition/Temp Warm   Gastrointestinal (WDL) WDL   Abdomen Inspection Soft;Rounded   GI Symptoms None   Mobility Bed   Pain Assessment   Charting Type Reassessment   Pain Scale Used Numeric Scale (0-10)   Numeric Pain Scale   Pain Score 0   POSS Score 1   Education   Person taught Patient   Knowledge basis Substantial   Topics taught Fluid Management   Teaching Tools Explain   Reponse Needs Reinforcement;Verbalizes Understanding   Bedside Nurse Communication   Name of bedside RN - post dialysis aimee sankoh rn

## 2021-01-11 NOTE — Nursing Progress Note (Signed)
PT is stable on RA, no distress/discomfort noted, had dialysis 2.4 liters taken out on 2 hours on left fistula, site intact no sign of inflammation noted.

## 2021-01-11 NOTE — Plan of Care (Signed)
Problem: Renal Instability  Goal: Fluid and electrolyte balance are achieved/maintained  Flowsheets (Taken 01/11/2021 0651 by Gretta Cool, RN)  Fluid and electrolyte balance are achieved/maintained:   Monitor intake and output every shift   Monitor/assess lab values and report abnormal values   Provide adequate hydration   Monitor daily weight   Assess for confusion/personality changes   Assess and reassess fluid and electrolyte status   Observe for seizure activity and initiate seizure precautions if indicated   Observe for cardiac arrhythmias   Monitor for muscle weakness     Problem: Patient Receiving Advanced Renal Therapies  Goal: Therapy access site remains intact  Flowsheets (Taken 01/10/2021 1010 by Westly Pam, RN)  Therapy access site remains intact: Assess therapy access site

## 2021-01-11 NOTE — Discharge Summary (Signed)
Discharge Summary    Date:01/11/2021   Patient Name: [name]  Attending Physician: Maricela Bo, MD    Date of Admission:   01/05/2021    Date of Discharge:   01/11/2021    Admitting Diagnosis:   Pulmonary edema    Discharge Dx:   Pulmonary edema  Hypervolemia  Syncope  Hypertensive urgency  Uncontrolled type 2 diabetes mellitus with hyperglycemia  Hyperlipidemia  Generalized abdominal pain  ESRD (end stage renal disease) on dialysis  Thrombocytopenia    Treatment Team:   Treatment Team:   Attending Provider: Maricela Bo, MD  Consulting Physician: Gaylyn Lambert, MD  Consulting Physician: Corky Mull, MD     Procedures performed:   Radiology: all results from this admission  CT Abd/ Pelvis without Contrast    Result Date: 01/05/2021  1. No evidence of ureteral stone or hydronephrosis. 2. Anasarca.  Elnita Maxwell, MD  01/05/2021 1:31 PM    XR Chest  AP Portable    Result Date: 01/05/2021   pulmonary edema Elyn Peers, MD  01/05/2021 10:46 AM    US Breast Left Ltd    Result Date: 01/07/2021   1. Subcutaneous edema and 3:00 region of left breast. No sonographic evidence of an obvious mass in the upper outer quadrant of left breast. 2. Prominent lymph node in left axilla with cortical thickening measuring 2.0 x 1.0 x 1.6 cm. 3. Diagnostic mammograms are recommended for a more complete evaluation. Next CATEGORY 0: INCOMPLETE - NEED ADDITIONAL IMAGING EVALUATION. WRITTEN RESULTS PROVIDED TO THE PATIENT PER MQSA REQUIREMENTS.   BIRAD: 05 Isabelle Course, MD  01/07/2021 1:13 PM     Reason for Admission:   Pulmonary edema    Hospital Course:     HPI:  [name] is a 65 y.o. female with a PMH of HTN, HLD, DM2, ESRD on HD MWF presenting to the ED for shortness of breath.  Patient states she has not been to dialysis since last Monday because she has not been feeling well due to body aches, abdominal pain nausea vomiting and diarrhea.  Reports chest pain, shortness of breath and leg swelling.  States  she did not take [place] 35 units last night.  Denies syncope, fever, hematemesis, hematochezia, melena, dysuria, hematuria, focal weakness.     Hospital course:    Presentation History   As per admitting provider, "65 y.o. female with a PMH of HTN, HLD, DM2, ESRD on HD MWF presenting to the ED for shortness of breath.  Patient states she has not been to dialysis since last Monday because she has not been feeling well due to body aches, abdominal pain nausea vomiting and diarrhea.  Reports chest pain, shortness of breath and leg swelling.  States she did not take [place] 35 units last night.  Denies syncope, fever, hematemesis, hematochezia, melena, dysuria, hematuria, focal weakness"     See HPI for details.     Hospital Course (0 Days)      #Pulmonary edema  #Hypervolemia  #ESRD on HD MWF  #Hypertensive urgency  -Patient presented with shortness of breath secondary to pulmonary edema/hypervolemia from missing her hemodialysis.  Patient also found to have hypertensive urgency with blood pressure going up to 235/112    S/p IV labetalol in the ED with improvement.  Missed dialysis.    Status post nephrology evaluation and hemodialysis with ultrafiltration on 01/05/2021, 01/06/2021 and 01/07/2021    Continue home amlodipine and Coreg.       Patient to  continue her hemodialysis on Monday Wednesday Friday     #Elevated troponin  #Chest pain and shortness of breath  -Patient presenting with chest discomfort along with mild troponin elevation  -EKG reviewed, mild troponin elevation.   -Status post cardiology evaluation  -Patient's elevated troponin likely secondary to demand ischemia in the setting of hypertensive crisis, flash pulmonary edema and missed dialysis  -Continue aspirin and statin for presumed CAD  -Volume control with hemodialysis        #Abdominal pain  #Nausea vomiting diarrhea  -Likely viral etiology.  Abdomen benign on exam. Afebrile, no leukocytosis.  COVID-19 and influenza negative.  CXR with no  infiltrate. CT abdomen pelvis shows anasarca otherwise no acute findings.    Patient reported significant improvement in her symptoms after fluid removal  -Continue to monitor as an outpatient        #DM with hyperglycemia  -BG 401. s/p 10 units insulin lispro in the ED.  -Currently better controlled  -Hemoglobin A1c 11.8 on 10/21/2020  -Repeat A1c on 01/07/2021 was 11.7     -Continue with Tresiba 40 units qhs, Humalog 10 units with meals and adjust as tolerated    #Left breast pain  -In the ED, patient reported left breast pain.  Per ED physician, left breast appears slightly larger than right but no induration, fluctuance or erythema.  -Status post ultrasound of left breast showing subcutaneous edema at 3:00 region of left breast.  No sonographic evidence of obvious mass.  Patient did have prominent lymph node in left axilla with cortical thickening measuring 2.0 x 1.0 x 1.6 cm    Patient to follow-up with primary care physician as an outpatient for diagnostic mammogram    Patient's case discussed with infectious disease.  Patient with history of Acinetobacter complex CRO and MRSA wound in the past we will give Augmentin and doxycycline to cover for Acinetobacter and MRSA as per infectious's recommendation     #?  Syncope  -Patient had possible syncopal episode working with occupational therapy   -Continue with PT/OT     #HLD  -Continue Lipitor     #Thrombocytopenia  -Appears chronic per chart review.  No evidence of bleeding.    -Monitor for now     BMI Assessment:  - Body mass index is 30.18 kg/m.  - Meets criteria for overweight given BMI between 25 and 30 due to excess calories/nutritional status. Weight loss and lifestyle modifications encouraged.     ____________________________________________________  Addendum     This patient was discharged on 01/07/2021.  Unfortunate patient's discharge was delayed due to delaying securing SNF facility.     Patient currently deciding between discharge to SNF facility  versus home with home PT/OT.  Case management on board    Awaiting case management to start insurance authorization for skilled nursing facility.  [place] [facility] willing to take patient     Patient will be discharged in hemodynamically stable condition once safe discharge planning has been set up      Condition at Discharge:   Stable    Today:     BP 117/73    Pulse 70    Temp 97.8 F (36.6 C) (Oral)    Resp 20    Ht 1.575 m (5\' 2" )    Wt 94.8 kg (208 lb 15.9 oz)    LMP  (LMP Unknown) Comment: post menopausal   SpO2 94%    BMI 38.23 kg/m   Ranges for the last 24 hours:  Temp:  [  97.8 F (36.6 C)-98.5 F (36.9 C)] 97.8 F (36.6 C)  Heart Rate:  [66-75] 70  Resp Rate:  [16-20] 20  BP: (105-124)/(46-75) 117/73    Last set of labs   Recent Labs   Lab 01/10/21  0332   WBC 6.88   Hgb 9.9*   Hematocrit 31.6*   Platelets 80*     Recent Labs   Lab 01/10/21  0332   Sodium 136   Potassium 4.6   Chloride 103   CO2 19   BUN 82.0*   Creatinine 6.3*   EGFR 8.0   Glucose 318*   Calcium 8.7     Recent Labs   Lab 01/10/21  0332 01/06/21  0319 01/05/21  1018   Bilirubin, Total  --   --  0.6   Protein, Total  --   --  6.3   Albumin 2.5*  More results in Results Review 2.9*   ALT  --   --  28   AST (SGOT)  --   --  14   More results in Results Review = values in this interval not displayed.               Invalid input(s): FREET4      Recent Labs   Lab 01/05/21  1502 01/05/21  1121   hs Troponin-I 93.3* 97.8*   hs Troponin-I Delta -4.5  --      Microbiology Results (last 15 days)       Procedure Component Value Units Date/Time    COVID-19 (SARS-CoV-2) and Influenza A/B, NAA (Liat Rapid)- Admission [[id number]] Collected: 01/05/21 1040    Order Status: Completed Specimen: Culturette from Nasopharyngeal Updated: 01/05/21 1239     Purpose of COVID testing Diagnostic -PUI     SARS-CoV-2 Specimen Source Nasal Swab     SARS CoV 2 Overall Result Not Detected     Comment: __________________________________________________  -A result of  "Detected" indicates POSITIVE for the    presence of SARS CoV-2 RNA  -A result of "Not Detected" indicates NEGATIVE for the    presence of SARS CoV-2 RNA  __________________________________________________________  Test performed using the Roche cobas Liat SARS-CoV-2 assay. This assay is  only for use under the Food and Drug Administrations Emergency Use  Authorization. This is a real-time RT-PCR assay for the qualitative  detection of SARS-CoV-2 RNA. Viral nucleic acids may persist in vivo,  independent of viability. Detection of viral nucleic acid does not imply the  presence of infectious virus, or that virus nucleic acid is the cause of  clinical symptoms. Negative results do not preclude SARS-CoV-2 infection and  should not be used as the sole basis for diagnosis, treatment or other  patient management decisions. Negative results must be combined with  clinical observations, patient history, and/or epidemiological information.  Invalid results may be due to inhibiting substances in the specimen and  recollection should occur. Please see Fact Sheets for patients and providers  located:  https://www.benson-chung.com/          Influenza A Not Detected     Influenza B Not Detected     Comment: Test performed using the Roche cobas Liat SARS-CoV-2 & Influenza A/B assay.  This assay is only for use under the Food and Drug Administrations  Emergency Use Authorization. This is a multiplex real-time RT-PCR assay  intended for the simultaneous in vitro qualitative detection and  differentiation of SARS-CoV-2, influenza A, and influenza B virus RNA. Viral  nucleic acids may  persist in vivo, independent of viability. Detection of  viral nucleic acid does not imply the presence of infectious virus, or that  virus nucleic acid is the cause of clinical symptoms. Negative results do  not preclude SARS-CoV-2, influenza A, and/or influenza B infection and  should not be used as the sole basis for diagnosis,  treatment or other  patient management decisions. Negative results must be combined with  clinical observations, patient history, and/or epidemiological information.  Invalid results may be due to inhibiting substances in the specimen and  recollection should occur. Please see Fact Sheets for patients and providers  located: http://olson-hall.info/.         Narrative:      o Collect and clearly label specimen type:  o PREFERRED-Upper respiratory specimen: One Nasal Swab in  [employer].  o Hand deliver to laboratory ASAP  Diagnostic -PUI            Micro / Labs / Path pending:     Unresulted Labs       None            Discharge Instructions For Providers     Follow up with PCP     Discharge Instructions:      Follow-up Information       Pcp, None, MD .                             Discharge Diet: Diabetic renal  Peripheral IV 01/05/21 20 G Right Antecubital (Active)   Site Assessment Clean;Dry;Intact 01/11/21 1000   Line Status Saline Locked;Flushed 01/11/21 1000   Tubing Dated? No 01/11/21 1000   Dressing Status Clean;Dry;Intact 01/11/21 1000   Dressing/Line Intervention Caps changed 01/11/21 1000   Dressing Dated? Yes 01/11/21 1000   Dressing Change Due 01/14/21 01/11/21 1000   Reason Not Rotated Not due 01/11/21 1000   Number of days: 6       Graft/Fistula Left (Active)   Number of days:      External Urinary Catheter (Active)   Number of days: 60       There is no immunization history on file for this patient.  Extended Emergency Contact Information  Primary Emergency Contact: okoronwo,shannon  Mobile Phone: [phone]  Relation: Daughter  Interpreter needed? No  Secondary Emergency Contact: [place]  Mobile Phone: [phone]  Relation: Spouse  Full Code  Activity/Weight bearing status: As tolerated   Discharge References/Attachments    None       Peripheral IV 01/05/21 20 G Right Antecubital (Active)   Site Assessment Clean;Dry;Intact 01/11/21 1000   Line Status Saline  Locked;Flushed 01/11/21 1000   Tubing Dated? No 01/11/21 1000   Dressing Status Clean;Dry;Intact 01/11/21 1000   Dressing/Line Intervention Caps changed 01/11/21 1000   Dressing Dated? Yes 01/11/21 1000   Dressing Change Due 01/14/21 01/11/21 1000   Reason Not Rotated Not due 01/11/21 1000   Number of days: 6       Graft/Fistula Left (Active)   Number of days:      External Urinary Catheter (Active)   Number of days: 60       There is no immunization history on file for this patient.  Extended Emergency Contact Information  Primary Emergency Contact: okoronwo,shannon  Mobile Phone: [phone]  Relation: Daughter  Interpreter needed? No  Secondary Emergency Contact: [place]  Mobile Phone: [phone]  Relation: Spouse  Full Code  Activity/Weight bearing status: As tolerated  Discharge References/Attachments    None       Disposition:  Home or Self Care     Discharge Medication List        Taking      acetaminophen 325 MG tablet  Dose: 650 mg  Commonly known as: TYLENOL  Take 2 tablets (650 mg) by mouth every 6 (six) hours as needed for Pain or Fever     albuterol sulfate HFA 108 (90 Base) MCG/ACT inhaler  Dose: 2 puff  Commonly known as: PROVENTIL  Inhale 2 puffs into the lungs every 6 (six) hours as needed for Wheezing or Shortness of Breath     amLODIPine 10 MG tablet  Dose: 10 mg  Commonly known as: NORVASC  Take 1 tablet (10 mg total) by mouth daily     amoxicillin-clavulanate 500-125 MG per tablet  Dose: 1 tablet  Commonly known as: AUGMENTIN  Take 1 tablet by mouth daily for 7 days     aspirin 81 MG chewable tablet  Dose: 81 mg  Chew 81 mg by mouth daily     atorvastatin 40 MG tablet  Dose: 40 mg  Commonly known as: LIPITOR  Take 40 mg by mouth daily     carvedilol 25 MG tablet  Dose: 1 tablet  Commonly known as: COREG  Take 1 tablet by mouth 2 (two) times daily with meals     doxycycline 100 MG capsule  Dose: 100 mg  Commonly known as: MONODOX  Take 1 capsule (100 mg) by mouth every 12 (twelve)  hours for 7 days     gabapentin 600 MG tablet  Dose: 600 mg  Commonly known as: NEURONTIN  Take 1 tablet (600 mg) by mouth 2 (two) times daily     insulin aspart 100 UNIT/ML injection  Dose: 10 Units  What changed: how much to take  Commonly known as: NovoLOG  Inject 10 Units into the skin 3 (three) times daily before meals     lactobacillus/streptococcus Caps  Dose: 1 capsule  Take 1 capsule by mouth 2 (two) times daily     ondansetron 4 MG disintegrating tablet  Dose: 4 mg  Commonly known as: ZOFRAN-ODT  Take 1 tablet (4 mg) by mouth every 6 (six) hours as needed for Nausea     sevelamer 800 MG tablet  Dose: 800 mg  Commonly known as: RENVELA  Take 800 mg by mouth 3 (three) times daily with meals     Tresiba FlexTouch 200 UNIT/ML Sopn  Dose: 45 Units  What changed: how much to take  Generic drug: Insulin Degludec  Inject 45 Units into the skin nightly            STOP taking these medications      vitamin C 500 MG tablet  Commonly known as: ASCORBIC ACID            Minutes spent coordinating discharge and reviewing discharge plan: 40 minutes      Signed by: Areal Cochrane Ruben Reason, MD

## 2021-01-11 NOTE — Progress Notes (Signed)
SW spoke with admissions rep today at [facility] who confirms insurance auth remains pendingDonny Pique   PH# [phone]    Requested CMA fax OT evaluation as requested by facility    [facility]

## 2021-01-12 LAB — GLUCOSE WHOLE BLOOD - POCT
Whole Blood Glucose POCT: 255 mg/dL — ABNORMAL HIGH (ref 70–100)
Whole Blood Glucose POCT: 194 mg/dL — ABNORMAL HIGH (ref 70–100)

## 2021-01-12 MED ORDER — SODIUM CHLORIDE 0.9 % IV BOLUS
100.0000 mL | INTRAVENOUS | Status: AC | PRN
Start: 2021-01-12 — End: 2021-01-12

## 2021-01-12 MED ORDER — SODIUM CHLORIDE 0.9 % IV BOLUS
250.0000 mL | INTRAVENOUS | Status: AC | PRN
Start: 2021-01-12 — End: 2021-01-12

## 2021-01-12 NOTE — Progress Notes (Signed)
[facility] PROGRESS NOTE  [id number]    Date Time: 01/12/21 10:13 AM  Patient Name: [name]  Attending Physician: Maricela Bo, MD    CC: follow-up ESRD, vol overload    Assessment:     1. ESRD on HD MWF at [facility] under Dr Corky Mull.   - Missed HD x>1 week.   3. Metabolic acidosis 2/2 missed HD; resolved   4. Anemia of CKD  5. HTN: still elevated  6. Volume Overload: better, but persisten  7. Diarrhea and Vomiting: resolved     Recommendations:     HD today   Keep on MWF schedule.   Plan for d/c to SNF eventually  (pending placement)    Case discussed with: pt and HD RN    Gaylyn Lambert, MD  [facility]  [id number] (office)    Subjective:   2.8 L net UF yesterday , Swelling improving, Poor cognition and  judgement, possibly mod cognitive decline +/- dementia   Review of Systems:      Deferred today    Physical Exam:     Vitals:    01/11/21 2158 01/12/21 0000 01/12/21 0327 01/12/21 0753   BP: 143/80 127/65 124/65 123/57   Pulse: 70 67 65 66   Resp:  19 18 (!) 31   Temp:  97.5 F (36.4 C) 97.2 F (36.2 C) 97 F (36.1 C)   TempSrc:  Oral Oral Oral   SpO2:  96% 97% 100%   Weight:   93.8 kg (206 lb 12.7 oz)    Height:           Intake and Output Summary (Last 24 hours) at Date Time    Intake/Output Summary (Last 24 hours) at 01/12/2021 1013  Last data filed at 01/11/2021 2158  Gross per 24 hour   Intake 100 ml   Output 2400 ml   Net -2300 ml         General: resting  Cardiovascular: regular rate and rhythm  Lungs: clear to auscultation bilaterally, bilateral air entry, normal work of breathing  Abdomen: soft  Extremities: no edema, LUE AVF      Meds:      Scheduled Meds: PRN Meds:    amLODIPine, 10 mg, Oral, Daily  amoxicillin-clavulanate, 1 tablet, Oral, Daily  aspirin, 81 mg, Oral, Daily  atorvastatin, 40 mg, Oral, QHS  carvedilol, 25 mg, Oral, Q12H SCH  doxycycline, 100 mg, Oral, Q12H SCH  gabapentin, 300 mg, Oral, BID  [Held by provider] heparin  (porcine), 5,000 Units, Subcutaneous, Q8H SCH  insulin glargine, 45 Units, Subcutaneous, QHS  insulin lispro, 10 Units, Subcutaneous, TID AC  lactobacillus/streptococcus, 1 capsule, Oral, BID  sevelamer, 800 mg, Oral, TID MEALS        Continuous Infusions:   acetaminophen, 650 mg, Q6H PRN  glucagon (rDNA), 1 mg, PRN   And  dextrose, 250 mL, PRN   And  dextrose, 25 g, PRN   And  dextrose, 25 g, PRN  glucagon (rDNA), 1 mg, PRN   And  dextrose, 250 mL, PRN   And  dextrose, 25 g, PRN   And  dextrose, 25 g, PRN  melatonin, 3 mg, QHS PRN  naloxone, 0.2 mg, PRN  ondansetron, 4 mg, Q6H PRN   Or  ondansetron, 4 mg, Q6H PRN            Labs:     Recent Labs   Lab 01/10/21  0332 01/09/21  0348 01/08/21  0335  WBC 6.88 6.54 4.62   Hgb 9.9* 10.6* 11.0*   Hematocrit 31.6* 34.8 35.9   Platelets 80* 73* 76*       Recent Labs   Lab 01/10/21  0332 01/09/21  0348 01/08/21  0335 01/06/21  0319 01/05/21  1018   Sodium 136 140 138  More results in Results Review 136   Potassium 4.6 4.4 4.6  More results in Results Review 4.9   Chloride 103 103 101  More results in Results Review 100   CO2 19 22 29   More results in Results Review 22   BUN 82.0* 60.0* 33.0*  More results in Results Review 69.0*   Creatinine 6.3* 5.2* 4.0*  More results in Results Review 9.4*   Calcium 8.7 8.6 8.2*  More results in Results Review 8.4*   Albumin 2.5* 2.6* 2.4*  More results in Results Review 2.9*   Phosphorus 4.4 3.8 3.1  More results in Results Review 7.0*   Magnesium  --   --   --   --  2.2   Glucose 318* 341* 443*  More results in Results Review 401*   EGFR 8.0 10.0 13.6  More results in Results Review 5.1   More results in Results Review = values in this interval not displayed.         Recent Labs   Lab 01/06/21  0319   Urine Type Urine, Clean Ca   Color, UA Yellow   Clarity, UA Hazy   Specific Gravity UA 1.014   Urine pH 7.0   Nitrite, UA Negative   Ketones UA Negative   Urobilinogen, UA Negative   Bilirubin, UA Negative   Blood, UA Negative   RBC, UA 3  - 5   WBC, UA 0 - 5           Signed by: Gaylyn Lambert, MD

## 2021-01-12 NOTE — Progress Notes (Addendum)
Pt medically cleared for discharge and agreeable to plan for home after HD today with home health. [facility] outpatient dialysis [facility] Friday.  Wheelchair transport with H&M for 7:30PM.   01/12/21 1646   Discharge Disposition   Physical Discharge Disposition Home, Home Health   Mode of Transportation Wheelchair [place]   Patient/Family/POA notified of transfer plan Yes   Patient agreeable to discharge plan/expected d/c date? Yes   Family/POA agreeable to discharge plan/expected d/c date? Yes   Bedside nurse notified of transport plan? Yes   CM Interventions   Referral made for home health RN visit? Yes   Multidisciplinary rounds/family meeting before d/c? Yes   Medicare Checklist   Is this a Medicare patient? Yes   Marzetta Merino, RN  RN Case Manager [facility]  [phone]

## 2021-01-12 NOTE — Progress Notes (Signed)
Notified by hosp CC, Mercelia  pt is planned to d/c home today after dialysis.  Will resume HD at OP clinic, [facility] on Friday.     Freida Busman, HD Care Coordinator  [phone]

## 2021-01-12 NOTE — Plan of Care (Signed)
Problem: Renal Instability  Goal: Fluid and electrolyte balance are achieved/maintained  Flowsheets (Taken 01/11/2021 0651 by Gretta Cool, RN)  Fluid and electrolyte balance are achieved/maintained:   Monitor intake and output every shift   Monitor/assess lab values and report abnormal values   Provide adequate hydration   Monitor daily weight   Assess for confusion/personality changes   Assess and reassess fluid and electrolyte status   Observe for seizure activity and initiate seizure precautions if indicated   Observe for cardiac arrhythmias   Monitor for muscle weakness     Problem: Patient Receiving Advanced Renal Therapies  Goal: Therapy access site remains intact  Flowsheets (Taken 01/10/2021 1010 by Westly Pam, RN)  Therapy access site remains intact: Assess therapy access site

## 2021-01-12 NOTE — Progress Notes (Signed)
The patient is scheduled for pick up at 7:30pm to address on file.

## 2021-01-12 NOTE — Discharge Instr - AVS First Page (Addendum)
Home Health Discharge Information    Your doctor has ordered Skilled Nursing, Physical Therapy, and Occupational Therapy in-home service(s) for you while you recuperate at home, to assist you in the transition from hospital to home.    The agency that you or your representative chose to provide the service:  Name of [place] Placement: [facility], [phone]      The above services were set up by:  Mayme Genta, RN ([place] Liaison) Phone  [phone]      IF YOU HAVE NOT HEARD [place] 24-48 HOURS AFTER DISCHARGE PLEASE CALL YOUR AGENCY TO ARRANGE A TIME FOR YOUR FIRST VISIT. FOR ANY SCHEDULING CONCERNS OR QUESTIONS RELATED TO HOME HEALTH, SUCH AS TIME OR DATE PLEASE CONTACT YOUR HOME HEALTH AGENCY AT THE NUMBER LISTED ABOVE.

## 2021-01-12 NOTE — Discharge Summary (Signed)
Discharge Summary    Date:01/12/2021   Patient Name: [name]  Attending Physician: Maricela Bo, MD    Date of Admission:   01/05/2021    Date of Discharge:   01/12/2021    Admitting Diagnosis:   Pulmonary edema    Discharge Dx:   Pulmonary edema  Hypervolemia  Syncope  Hypertensive urgency  Uncontrolled type 2 diabetes mellitus with hyperglycemia  Hyperlipidemia  Generalized abdominal pain  ESRD (end stage renal disease) on dialysis  Thrombocytopenia    Treatment Team:   Treatment Team:   Attending Provider: Maricela Bo, MD  Consulting Physician: Gaylyn Lambert, MD  Consulting Physician: Corky Mull, MD     Procedures performed:   Radiology: all results from this admission  CT Abd/ Pelvis without Contrast    Result Date: 01/05/2021  1. No evidence of ureteral stone or hydronephrosis. 2. Anasarca.  Diane Maxwell, MD  01/05/2021 1:31 PM    XR Chest  AP Portable    Result Date: 01/05/2021   pulmonary edema Diane Peers, MD  01/05/2021 10:46 AM    US Breast Left Ltd    Result Date: 01/07/2021   1. Subcutaneous edema and 3:00 region of left breast. No sonographic evidence of an obvious mass in the upper outer quadrant of left breast. 2. Prominent lymph node in left axilla with cortical thickening measuring 2.0 x 1.0 x 1.6 cm. 3. Diagnostic mammograms are recommended for a more complete evaluation. Next CATEGORY 0: INCOMPLETE - NEED ADDITIONAL IMAGING EVALUATION. WRITTEN RESULTS PROVIDED TO THE PATIENT PER MQSA REQUIREMENTS.   BIRAD: 52 Diane Course, MD  01/07/2021 1:13 PM     Reason for Admission:   Pulmonary edema  [facility]:     HPI:  [name] is a 65 y.o. female with a PMH of HTN, HLD, DM2, ESRD on HD MWF presenting to the ED for shortness of breath.  Patient states she has not been to dialysis since last Monday because she has not been feeling well due to body aches, abdominal pain nausea vomiting and diarrhea.  Reports chest pain, shortness of breath and leg swelling.  States  she did not take [place] 35 units last night.  Denies syncope, fever, hematemesis, hematochezia, melena, dysuria, hematuria, focal weakness.      [facility]:     Presentation History   As per admitting provider, "65 y.o. female with a PMH of HTN, HLD, DM2, ESRD on HD MWF presenting to the ED for shortness of breath.  Patient states she has not been to dialysis since last Monday because she has not been feeling well due to body aches, abdominal pain nausea vomiting and diarrhea.  Reports chest pain, shortness of breath and leg swelling.  States she did not take [place] 35 units last night.  Denies syncope, fever, hematemesis, hematochezia, melena, dysuria, hematuria, focal weakness"     See HPI for details.     [facility] (0 Days)      #Pulmonary edema  #Hypervolemia  #ESRD on HD MWF  #Hypertensive urgency  -Patient presented with shortness of breath secondary to pulmonary edema/hypervolemia from missing her hemodialysis.  Patient also found to have hypertensive urgency with blood pressure going up to 235/112    S/p IV labetalol in the ED with improvement.  Missed dialysis.    Status post nephrology evaluation and hemodialysis with ultrafiltration on 01/05/2021, 01/06/2021 and 01/07/2021    Continue home amlodipine and Coreg.       Patient to  continue her hemodialysis on Monday Wednesday Friday     #Elevated troponin  #Chest pain and shortness of breath  -Patient presenting with chest discomfort along with mild troponin elevation  -EKG reviewed, mild troponin elevation.   -Status post cardiology evaluation  -Patient's elevated troponin likely secondary to demand ischemia in the setting of hypertensive crisis, flash pulmonary edema and missed dialysis  -Continue aspirin and statin for presumed CAD  -Volume control with hemodialysis        #Abdominal pain  #Nausea vomiting diarrhea  -Likely viral etiology.  Abdomen benign on exam. Afebrile, no leukocytosis.  COVID-19 and influenza negative.  CXR with no  infiltrate. CT abdomen pelvis shows anasarca otherwise no acute findings.    Patient reported significant improvement in her symptoms after fluid removal  -Continue to monitor as an outpatient        #DM with hyperglycemia  -BG 401. s/p 10 units insulin lispro in the ED.  -Currently better controlled  -Hemoglobin A1c 11.8 on 10/21/2020  -Repeat A1c on 01/07/2021 was 11.7     -Continue with Tresiba 40 units qhs, Humalog 10 units with meals and adjust as tolerated    #Left breast pain  -In the ED, patient reported left breast pain.  Per ED physician, left breast appears slightly larger than right but no induration, fluctuance or erythema.  -Status post ultrasound of left breast showing subcutaneous edema at 3:00 region of left breast.  No sonographic evidence of obvious mass.  Patient did have prominent lymph node in left axilla with cortical thickening measuring 2.0 x 1.0 x 1.6 cm    Patient to follow-up with primary care physician as an outpatient for diagnostic mammogram    Patient's case discussed with infectious disease.  Patient with history of Acinetobacter complex CRO and MRSA wound in the past we will give Augmentin and doxycycline to cover for Acinetobacter and MRSA as per infectious's recommendation     #?  Syncope  -Patient had possible syncopal episode working with occupational therapy   -Continue with PT/OT     #HLD  -Continue Lipitor     #Thrombocytopenia  -Appears chronic per chart review.  No evidence of bleeding.    -Monitor for now     BMI Assessment:  - Body mass index is 30.18 kg/m.  - Meets criteria for overweight given BMI between 25 and 30 due to excess calories/nutritional status. Weight loss and lifestyle modifications encouraged.     ____________________________________________________  Addendum     This patient was discharged on 01/07/2021.  Unfortunate patient's discharge was delayed due to delaying securing SNF facility.     Patient currently deciding between discharge to SNF facility  versus home with home PT/OT.  Case management on board    Awaiting case management to start insurance authorization for skilled nursing facility.  [facility] willing to take patient     Patient will be discharged in hemodynamically stable condition once safe discharge planning has been set up    Condition at Discharge:   Stable    Today:     BP 115/67    Pulse 66    Temp 98.5 F (36.9 C) (Oral)    Resp (!) 24    Ht 1.575 m (5\' 2" )    Wt 93.8 kg (206 lb 12.7 oz)    LMP  (LMP Unknown) Comment: post menopausal   SpO2 95%    BMI 37.82 kg/m   Ranges for the last 24 hours:  Temp:  [21  F (36.1 C)-98.5 F (36.9 C)] 98.5 F (36.9 C)  Heart Rate:  [58-76] 66  Resp Rate:  [16-31] 24  BP: (87-169)/(26-93) 115/67    Last set of labs   Recent Labs   Lab 01/10/21  0332   WBC 6.88   Hgb 9.9*   Hematocrit 31.6*   Platelets 80*     Recent Labs   Lab 01/10/21  0332   Sodium 136   Potassium 4.6   Chloride 103   CO2 19   BUN 82.0*   Creatinine 6.3*   EGFR 8.0   Glucose 318*   Calcium 8.7     Recent Labs   Lab 01/10/21  0332   Albumin 2.5*               Invalid input(s): FREET4      Recent Labs   Lab 01/05/21  1502   hs Troponin-I 93.3*   hs Troponin-I Delta -4.5     Microbiology Results (last 15 days)       Procedure Component Value Units Date/Time    COVID-19 (SARS-CoV-2) and Influenza A/B, NAA (Liat Rapid)- Admission [[id number]] Collected: 01/05/21 1040    Order Status: Completed Specimen: Culturette from Nasopharyngeal Updated: 01/05/21 1239     Purpose of COVID testing Diagnostic -PUI     SARS-CoV-2 Specimen Source Nasal Swab     SARS CoV 2 Overall Result Not Detected     Comment: __________________________________________________  -A result of "Detected" indicates POSITIVE for the    presence of SARS CoV-2 RNA  -A result of "Not Detected" indicates NEGATIVE for the    presence of SARS CoV-2 RNA  __________________________________________________________  Test performed using the Roche cobas Liat SARS-CoV-2 assay. This  assay is  only for use under the Food and Drug Administrations Emergency Use  Authorization. This is a real-time RT-PCR assay for the qualitative  detection of SARS-CoV-2 RNA. Viral nucleic acids may persist in vivo,  independent of viability. Detection of viral nucleic acid does not imply the  presence of infectious virus, or that virus nucleic acid is the cause of  clinical symptoms. Negative results do not preclude SARS-CoV-2 infection and  should not be used as the sole basis for diagnosis, treatment or other  patient management decisions. Negative results must be combined with  clinical observations, patient history, and/or epidemiological information.  Invalid results may be due to inhibiting substances in the specimen and  recollection should occur. Please see Fact Sheets for patients and providers  located:  https://www.benson-chung.com/          Influenza A Not Detected     Influenza B Not Detected     Comment: Test performed using the Roche cobas Liat SARS-CoV-2 & Influenza A/B assay.  This assay is only for use under the Food and Drug Administrations  Emergency Use Authorization. This is a multiplex real-time RT-PCR assay  intended for the simultaneous in vitro qualitative detection and  differentiation of SARS-CoV-2, influenza A, and influenza B virus RNA. Viral  nucleic acids may persist in vivo, independent of viability. Detection of  viral nucleic acid does not imply the presence of infectious virus, or that  virus nucleic acid is the cause of clinical symptoms. Negative results do  not preclude SARS-CoV-2, influenza A, and/or influenza B infection and  should not be used as the sole basis for diagnosis, treatment or other  patient management decisions. Negative results must be combined with  clinical observations, patient history, and/or epidemiological information.  Invalid results may be due to inhibiting substances in the specimen and  recollection should occur. Please see Fact  Sheets for patients and providers  located: http://olson-hall.info/.         Narrative:      o Collect and clearly label specimen type:  o PREFERRED-Upper respiratory specimen: One Nasal Swab in  [employer].  o Hand deliver to laboratory ASAP  Diagnostic -PUI            Micro / Labs / Path pending:     Unresulted Labs       None            Discharge Instructions For Providers     Follow up with PCP      Discharge Instructions:      Follow-up Information       Pcp, None, MD .                             Discharge Diet: Renal Consistent Carbohydrates        Peripheral IV 01/05/21 20 G Right Antecubital (Active)   Site Assessment Clean;Dry;Intact 01/12/21 1000   Line Status Saline Locked 01/12/21 1000   Tubing Dated? No 01/12/21 1000   Dressing Status Clean;Dry;Intact 01/12/21 1000   Dressing/Line Intervention Caps changed 01/12/21 1000   Dressing Dated? Yes 01/12/21 1000   Dressing Change Due 01/14/21 01/12/21 1000   Reason Not Rotated Not due 01/12/21 1000   Number of days: 7       Graft/Fistula Left (Active)   Number of days:      External Urinary Catheter (Active)   Number of days: 61       There is no immunization history on file for this patient.  Extended Emergency Contact Information  Primary Emergency Contact: okoronwo,shannon  Mobile Phone: [phone]  Relation: Daughter  Interpreter needed? No  Secondary Emergency Contact: [place]  Mobile Phone: [phone]  Relation: Spouse  Full Code  Activity/Weight bearing status: As tolerated   Discharge References/Attachments    None       Disposition:  Home or Self Care     Discharge Medication List        Taking      acetaminophen 325 MG tablet  Dose: 650 mg  Commonly known as: TYLENOL  Take 2 tablets (650 mg) by mouth every 6 (six) hours as needed for Pain or Fever     albuterol sulfate HFA 108 (90 Base) MCG/ACT inhaler  Dose: 2 puff  Commonly known as: PROVENTIL  Inhale 2 puffs into the lungs every 6 (six) hours as needed for Wheezing  or Shortness of Breath     amLODIPine 10 MG tablet  Dose: 10 mg  Commonly known as: NORVASC  Take 1 tablet (10 mg total) by mouth daily     amoxicillin-clavulanate 500-125 MG per tablet  Dose: 1 tablet  Commonly known as: AUGMENTIN  Take 1 tablet by mouth daily for 7 days     aspirin 81 MG chewable tablet  Dose: 81 mg  Chew 81 mg by mouth daily     atorvastatin 40 MG tablet  Dose: 40 mg  Commonly known as: LIPITOR  Take 40 mg by mouth daily     carvedilol 25 MG tablet  Dose: 1 tablet  Commonly known as: COREG  Take 1 tablet by mouth 2 (two) times daily with meals     doxycycline 100 MG capsule  Dose:  100 mg  Commonly known as: MONODOX  Take 1 capsule (100 mg) by mouth every 12 (twelve) hours for 7 days     gabapentin 600 MG tablet  Dose: 600 mg  Commonly known as: NEURONTIN  Take 1 tablet (600 mg) by mouth 2 (two) times daily     insulin aspart 100 UNIT/ML injection  Dose: 10 Units  What changed: how much to take  Commonly known as: NovoLOG  Inject 10 Units into the skin 3 (three) times daily before meals     lactobacillus/streptococcus Caps  Dose: 1 capsule  Take 1 capsule by mouth 2 (two) times daily     ondansetron 4 MG disintegrating tablet  Dose: 4 mg  Commonly known as: ZOFRAN-ODT  Take 1 tablet (4 mg) by mouth every 6 (six) hours as needed for Nausea     sevelamer 800 MG tablet  Dose: 800 mg  Commonly known as: RENVELA  Take 800 mg by mouth 3 (three) times daily with meals     Tresiba FlexTouch 200 UNIT/ML Sopn  Dose: 45 Units  What changed: how much to take  Generic drug: Insulin Degludec  Inject 45 Units into the skin nightly            STOP taking these medications      vitamin C 500 MG tablet  Commonly known as: ASCORBIC ACID            Minutes spent coordinating discharge and reviewing discharge plan: 40 minutes      Signed by: Heleena Miceli Ruben Reason, MD

## 2021-01-12 NOTE — Nursing Progress Note (Signed)
PT is stable on shift, continues on RA, due med's well tolerated, went for dialysis 2.5 liters taken out in 3.5 hrs, PT is going home when she returns back to unit from dialysis transport in unit waiting, D/C orders in place per MD order.

## 2021-01-12 NOTE — Progress Notes (Signed)
CM met with patient at bedside.  Pt stated, "I want to go home."  She confirmed with son over the phone that she has support at home and she also confirmed that she has a caregiver.  Cm to send referral to [facility] and arrange transportation for a home discharge.    Marzetta Merino, RN  RN Case Manager [facility]  [phone]

## 2021-01-12 NOTE — Plan of Care (Signed)
A+ox4, prn tylenol given for pain. Safety measures in place per protocol. Ambulated to rrx2      Problem: Safety  Goal: Patient will be free from injury during hospitalization  Outcome: Progressing  Flowsheets (Taken 01/11/2021 [phone])  Patient will be free from injury during hospitalization:   Assess patient's risk for falls and implement fall prevention plan of care per policy   Provide and maintain safe environment   Use appropriate transfer methods   Ensure appropriate safety devices are available at the bedside   Include patient/ family/ care giver in decisions related to safety   [place] rounding   Assess for patients risk for elopement and implement Ward per policy   Provide alternative method of communication if needed (communication boards, writing)     Problem: Pain  Goal: Pain at adequate level as identified by patient  Outcome: Progressing  Flowsheets (Taken 01/12/2021 0645)  Pain at adequate level as identified by patient:   Identify patient comfort function goal   Assess for risk of opioid induced respiratory depression, including snoring/sleep apnea. Alert healthcare team of risk factors identified.   Assess pain on admission, during daily assessment and/or before any "as needed" intervention(s)   Reassess pain within 30-60 minutes of any procedure/intervention, per Pain Assessment, Intervention, Reassessment (AIR) Cycle   Evaluate if patient comfort function goal is met   Evaluate patient's satisfaction with pain management progress   Offer non-pharmacological pain management interventions     Problem: Renal Instability  Goal: Fluid and electrolyte balance are achieved/maintained  Outcome: Progressing  Flowsheets (Taken 01/11/2021 0651)  Fluid and electrolyte balance are achieved/maintained:   Monitor intake and output every shift   Monitor/assess lab values and report abnormal values   Provide adequate hydration   Monitor daily weight   Assess for confusion/personality changes   Assess  and reassess fluid and electrolyte status   Observe for seizure activity and initiate seizure precautions if indicated   Observe for cardiac arrhythmias   Monitor for muscle weakness     Problem: Moderate/High Fall Risk Score >5  Goal: Patient will remain free of falls  Outcome: Progressing  Flowsheets (Taken 01/12/2021 0645)  High (Greater than 13):   HIGH-Visual cue at entrance to patient's room   HIGH-Consider use of low bed   HIGH-Bed alarm on at all times while patient in bed   HIGH-Apply yellow "Fall Risk" arm band   HIGH-Initiate use of floor mats as appropriate

## 2021-01-12 NOTE — Progress Notes (Signed)
Post Acute Care Coordinator [facility]) ([place]) received referral from CM for [facility] SN PT OT. Spoke with patient at [facility] to explain services. Patient states that she has waiver caregiver 8h/day, and son is present rest of time. States that PCP is Dr. Reesa Chew in [place]; doesn't know first name or #. Agreeable to [facility] services, no preferred agency.    Sent in [employer] to find agency in network with patient's insurance. Confirmed with MD office that PCP is Dr. Horton Marshall, [phone].

## 2021-01-12 NOTE — PT Progress Note (Signed)
Physical Therapy Note    [facility]  Physical Therapy Attempt Note    Patient:  [name] MRN#:  [id number]  Unit:  [place] [facility] INTERMEDIATE CARE Room/Bed:  B9390/Z0092-Z      Physical Therapy treatment attempted on 01/12/2021 at 2:16 PM unable to complete secondary to patient difficult to arouse. Awakens briefly opening eyes then resumes sleeping. Attempted to awaken pt again w/o successes. Physical therapy will follow up at a later time/date as able.     RN aware.      Adah Salvage, [place] Lic# [id number]    Physical Medicine and [facility]  [phone]    01/12/2021  2:19 PM

## 2021-01-12 NOTE — Progress Notes (Signed)
PT is D/C per MD order, left unit in a stable condition for home at  7:55 PM with H&M transport W/C service, IV taken out per hospital protocol, D/C paper works given to PT on D/C no concerns voiced.

## 2021-01-12 NOTE — Progress Notes (Signed)
Arrived via bed. Pt. Very sleepy, arousable. Lungs diminished. No acute sob noted. Generalized non pitting edema remains. Good bruit and thrill in LAF. Telemetry SR. For possible [place] to SNF tonite. Report received from Zada Girt RN   01/12/21 1515   Dialysis Weight   Pre-Treatment Weight (Kg) 93.9   Scale Type ICU Bed Scale   Vitals   Temp 98.3 F (36.8 C)   Heart Rate 67   Resp Rate 22   BP 131/58   SpO2 95 %   O2 Device None (Room air)   Assessment   Mental Status Lethargic   Cardiac (WDL) WDL   Cardiac Regularity Regular   Cardiac Rhythm Normal Sinus Rhythm   Respiratory  WDL   Respiratory Pattern Regular;Easy   Bilateral Breath Sounds Diminished;Clear   Edema  X   Generalized Edema Non Pitting Edema   General Skin Color Appropriate for ethnicity   Skin Condition/Temp Warm   Gastrointestinal (WDL) WDL   Abdomen Inspection Rounded   GI Symptoms None   Mobility Bed   Pain Assessment   Charting Type Assessment   Pain Scale Used Wong-Baker FACES   Numeric Pain Scale   Pain Score 0   POSS Score 3   Hemodialysis Comments   Pre-Hemodialysis Comments time out done

## 2021-01-12 NOTE — Progress Notes (Signed)
Home Health Referral      Referral from [facility] (Case Manager) for home health care upon discharge.    By [employer], the patient has the right to freely choose a home care provider.    Arrangements have been made with:    A company of the patients choosing. We have supplied the patient with a listing of providers in your area who asked to be included and participate in Medicare.   [facility] [place], a home care agency that provides adult home care services and participates in Medicare   The preferred provider of your insurance company. Choosing a home care provider other than your insurance company's preferred provider may affect your insurance coverage.      Home Health Discharge Information    Your doctor has ordered Skilled Nursing, Physical Therapy, and Occupational Therapy in-home service(s) for you while you recuperate at home, to assist you in the transition from hospital to home.    The agency that you or your representative chose to provide the service:  [facility], [phone]    The above services were set up by:  Mayme Genta, RN [facility] Liaison) Phone  [phone]      IF YOU HAVE NOT HEARD [facility] 24-48 HOURS AFTER DISCHARGE PLEASE CALL YOUR AGENCY TO ARRANGE A TIME FOR YOUR FIRST VISIT. FOR ANY SCHEDULING CONCERNS OR QUESTIONS RELATED TO HOME HEALTH, SUCH AS TIME OR DATE PLEASE CONTACT YOUR HOME HEALTH AGENCY AT THE NUMBER LISTED ABOVE.    HOME HEALTH REFERRAL    PATIENT DEMOGRAPHICS:        Name: [name]    Discharge Address: [place] [address]      Primary Telephone Number:  [phone] cell  Secondary Telephone Number: [phone] cell #2  Emergency Contact and Number: Extended Emergency Contact Information  Primary Emergency Contact: [name]  Mobile Phone: [phone]  Relation: Daughter  Interpreter needed? No  Secondary Emergency Contact:  [name]  Mobile Phone: [phone]  Relation: Spouse      Ordering Physician: Diane Bo, MD     PCP: Diane Marshall, MD, [phone]    Following Physician: Diane Marshall, MD  Willing to follow: yes, spoke to office 12/21    Language/Communication Barrier:           No    Primary Diagnosis and Reason for Services:  pulmonary edema, DM HTN, ESRD on dialysis, weakness      [employer] (Labs, Wounds, Infusions, etc.):  no    Additional Comments:  Goes to HD MWF    COVID-19 Screening:  Component 01/05/21 10:40 AM    Purpose of COVID testing Diagnostic -PUI    SARS-CoV-2 Specimen Source Nasal Swab    SARS CoV 2 Overall Result Not Detected    Comment: __________________________________________________     Discharge Date: 01/12/21  Referral Source (PACC/Hospital/Unit): Mayme Genta, RN  Referral Date: 01/12/21       Home Health face-to-face (FTF) Encounter (Order [id number])  Consult  Date: 01/12/2021 Department: [facility] [address] [facility] Ordering/Authorizing: Diane Bo, MD     Order Information    Order Date/Time Release Date/Time Start Date/Time End Date/Time   01/12/21 03:30 PM None 01/12/21 03:24 PM 01/12/21 03:24 PM     Order Details    Frequency Duration Priority Order Class   Once 1  occurrence Routine Hospital Performed  Standing Order Information    Remaining Occurrences Interval Last Released     0/1 Once 01/12/2021              Provider Information    Ordering User Ordering Provider Authorizing Provider   Mayme Genta, RN Diane Bo, MD Diane Bo, MD   Attending Provider(s) Admitting Provider PCP   Diane Brazen, MD; Diane Mocha, MD; Diane Filter, DO; Diane Landry, MD; Diane Bo, MD Diane Mocha, MD Diane Marshall, MD     Verbal Order Info    Action Created on Order Mode Entered by Responsible Provider Signed by Signed on   Ordering 01/12/21 1530 Telephone with Susanne Borders, RN Diane Bo, MD              Comments    Dr. Horton Young, PCP to follow   [phone]     Discharge Dx:   Pulmonary edema   Hypervolemia   Syncope   Hypertensive urgency   Uncontrolled type 2 diabetes mellitus with hyperglycemia   Hyperlipidemia   Generalized abdominal pain   ESRD (end stage renal disease) on dialysis   Thrombocytopenia       Home nursing required for skilled assessment including cardiopulmonary assessment and dietary education for disease management, and medication instruction. Home PT/OT required for gait and balance training, strengthening, mobility, fall prevention, and ADL training.                Home Health face-to-face (FTF) Encounter: Patient Communication     Not Released  Not seen         Order Questions    Question Answer   Date I saw the patient face-to-face: 01/12/2021   Evidence this patient is homebound because: C.  Decreased endurance, strength, ROM, cadence, safety/judgment during mobility    E.  Requires assistance of another person or assistive device to ambulate > 5 feet    G.  Fall risk due to impaired coordination, gait and decreased balance    L.  Transfer/ambulation requires stand by assist and verbal cues to perform safely    N.  Impaired mobility d/t pain, arthritis, weakness that compromises patient safety   Medical conditions that necessitate Home Health care: B.  Functional impairment due to recent hospitalization/procedure/treatment    C.  Risk for complication/infection/pain requiring follow up and monitoring    D.  Chronic illness & risk for re-hospitalization due to unstable disease status    E.  Exacerbation of disease requiring follow up monitoring    F.  New diagnosis & treatment requiring follow up monitoring and management   Per clinical findings, following services are medically necessary: Skilled Nursing    PT    OT   Clinical findings that support the need for Skilled Nursing. SN will: C. Monitor for signs and symptoms of exacerbation of disease and management    D. Review  medication reconciliation, manage and educate on use and side effects    G. Educate on new diagnosis, treatment & management to prevent re-hospitalization    H. Assess cardiopulmonary status and monitor for signs &symptoms of exacerbation    I.  Educate dietary and or fluid restrictions and weight management   Clinical findings that support the need for Physical Therapy. PT will A.  Evaluate and treat functional impairment and improve mobility    C.  Educate on weight bearing status, stair/gait training, balance & coordination    D.  Provide services to  help restore function, mobility, and releive pain    E.  Educate on functional mobility; bed, chair, sit, stand and transfer activities    F.  Perform home safety assessment & develop safe in home exercise program    G.  Implement activities to improve stance time, cadence & step length    H.  Educate on the safe use of assistive device/ durable medical equipment    I.  Instruct on restorative activities to restore ability to perform ADL   OT will provide assistance with: Home program to improve ability to perform ADLs    Recovery and maintenance skills    Basic motor function and reasoning abilities    Strategies to compensate for loss of function    Restorative program to improve mobility and independence                    Process Instructions    Please select Home Care Services medically necessary.     Based on the above findings, I certify that this patient is confined to the home and needs intermittent skilled nursing care, physical therapry and / or speech therapy or continues to need occupational therapy. The patient is under my care, and I have initiated the establishment of the plan of care. This patient will be followed by a physician who will periodically review the plan of care.      Collection Information            Consult Order Info    ID Description Priority Start Date Start Time   [id number] [place] face-to-face (FTF) Encounter Routine 01/12/2021   3:24 PM   Provider Specialty Referred to   ______________________________________ _____________________________________         Acknowledgement Info    For At Acknowledged By Acknowledged On   Placing Order 01/12/21 [place], [place], RN 01/12/21 1549                     Verbal Order Info    Action Created on Order Mode Entered by Responsible Provider Signed by Signed on   Ordering 01/12/21 [place] Telephone with Susanne Borders, RN Diane Bo, MD             Patient Information    Patient Name   Zoraida, Havrilla Legal Sex   Female DOB   Apr 26, 1955       Reprint Order [place] face-to-face (FTF) Encounter (Order #[id number]) on 01/12/21       Additional Information    Associated Reports External References   Priority and Order Details [employer]

## 2021-02-04 ENCOUNTER — Emergency Department: Payer: 59

## 2021-02-04 ENCOUNTER — Observation Stay: Payer: 59

## 2021-02-04 ENCOUNTER — Observation Stay
Admission: EM | Admit: 2021-02-04 | Discharge: 2021-02-08 | Disposition: A | Discharge status: Home / Self Care | Payer: 59 | Attending: Internal Medicine | Admitting: Internal Medicine

## 2021-02-04 DIAGNOSIS — E785 Hyperlipidemia, unspecified: Secondary | ICD-10-CM | POA: Insufficient documentation

## 2021-02-04 DIAGNOSIS — R079 Chest pain, unspecified: Secondary | ICD-10-CM

## 2021-02-04 DIAGNOSIS — J209 Acute bronchitis, unspecified: Secondary | ICD-10-CM | POA: Insufficient documentation

## 2021-02-04 DIAGNOSIS — N2581 Secondary hyperparathyroidism of renal origin: Secondary | ICD-10-CM | POA: Insufficient documentation

## 2021-02-04 DIAGNOSIS — I509 Heart failure, unspecified: Secondary | ICD-10-CM | POA: Insufficient documentation

## 2021-02-04 DIAGNOSIS — Z794 Long term (current) use of insulin: Secondary | ICD-10-CM | POA: Insufficient documentation

## 2021-02-04 DIAGNOSIS — E1122 Type 2 diabetes mellitus with diabetic chronic kidney disease: Secondary | ICD-10-CM | POA: Insufficient documentation

## 2021-02-04 DIAGNOSIS — I132 Hypertensive heart and chronic kidney disease with heart failure and with stage 5 chronic kidney disease, or end stage renal disease: Secondary | ICD-10-CM | POA: Insufficient documentation

## 2021-02-04 DIAGNOSIS — Z20822 Contact with and (suspected) exposure to covid-19: Secondary | ICD-10-CM | POA: Insufficient documentation

## 2021-02-04 DIAGNOSIS — J9601 Acute respiratory failure with hypoxia: Secondary | ICD-10-CM | POA: Insufficient documentation

## 2021-02-04 DIAGNOSIS — R0602 Shortness of breath: Secondary | ICD-10-CM

## 2021-02-04 DIAGNOSIS — E1165 Type 2 diabetes mellitus with hyperglycemia: Secondary | ICD-10-CM | POA: Insufficient documentation

## 2021-02-04 DIAGNOSIS — Z79899 Other long term (current) drug therapy: Secondary | ICD-10-CM | POA: Insufficient documentation

## 2021-02-04 DIAGNOSIS — N186 End stage renal disease: Secondary | ICD-10-CM

## 2021-02-04 DIAGNOSIS — Z7982 Long term (current) use of aspirin: Secondary | ICD-10-CM | POA: Insufficient documentation

## 2021-02-04 DIAGNOSIS — Z9115 Patient's noncompliance with renal dialysis: Secondary | ICD-10-CM | POA: Insufficient documentation

## 2021-02-04 DIAGNOSIS — I214 Non-ST elevation (NSTEMI) myocardial infarction: Secondary | ICD-10-CM

## 2021-02-04 DIAGNOSIS — G4730 Sleep apnea, unspecified: Secondary | ICD-10-CM | POA: Insufficient documentation

## 2021-02-04 DIAGNOSIS — K219 Gastro-esophageal reflux disease without esophagitis: Secondary | ICD-10-CM | POA: Insufficient documentation

## 2021-02-04 DIAGNOSIS — J440 Chronic obstructive pulmonary disease with acute lower respiratory infection: Secondary | ICD-10-CM | POA: Insufficient documentation

## 2021-02-04 DIAGNOSIS — I071 Rheumatic tricuspid insufficiency: Secondary | ICD-10-CM | POA: Insufficient documentation

## 2021-02-04 DIAGNOSIS — R197 Diarrhea, unspecified: Secondary | ICD-10-CM | POA: Insufficient documentation

## 2021-02-04 DIAGNOSIS — Z992 Dependence on renal dialysis: Secondary | ICD-10-CM | POA: Insufficient documentation

## 2021-02-04 DIAGNOSIS — D631 Anemia in chronic kidney disease: Secondary | ICD-10-CM | POA: Insufficient documentation

## 2021-02-04 DIAGNOSIS — E875 Hyperkalemia: Secondary | ICD-10-CM | POA: Insufficient documentation

## 2021-02-04 DIAGNOSIS — I161 Hypertensive emergency: Principal | ICD-10-CM | POA: Insufficient documentation

## 2021-02-04 DIAGNOSIS — I4510 Unspecified right bundle-branch block: Secondary | ICD-10-CM | POA: Insufficient documentation

## 2021-02-04 DIAGNOSIS — Z6837 Body mass index (BMI) 37.0-37.9, adult: Secondary | ICD-10-CM | POA: Insufficient documentation

## 2021-02-04 DIAGNOSIS — J810 Acute pulmonary edema: Secondary | ICD-10-CM

## 2021-02-04 DIAGNOSIS — E1142 Type 2 diabetes mellitus with diabetic polyneuropathy: Secondary | ICD-10-CM | POA: Insufficient documentation

## 2021-02-04 DIAGNOSIS — D696 Thrombocytopenia, unspecified: Secondary | ICD-10-CM | POA: Insufficient documentation

## 2021-02-04 DIAGNOSIS — Z79891 Long term (current) use of opiate analgesic: Secondary | ICD-10-CM | POA: Insufficient documentation

## 2021-02-04 DIAGNOSIS — E669 Obesity, unspecified: Secondary | ICD-10-CM | POA: Insufficient documentation

## 2021-02-04 DIAGNOSIS — R778 Other specified abnormalities of plasma proteins: Secondary | ICD-10-CM

## 2021-02-04 LAB — GLUCOSE WHOLE BLOOD - POCT
Whole Blood Glucose POCT: 344 mg/dL — ABNORMAL HIGH (ref 70–100)
Whole Blood Glucose POCT: 366 mg/dL — ABNORMAL HIGH (ref 70–100)
Whole Blood Glucose POCT: 402 mg/dL — ABNORMAL HIGH (ref 70–100)
Whole Blood Glucose POCT: 156 mg/dL — ABNORMAL HIGH (ref 70–100)

## 2021-02-04 LAB — COMPREHENSIVE METABOLIC PANEL
ALT: 23 U/L (ref 0–55)
AST (SGOT): 23 U/L (ref 5–41)
Albumin/Globulin Ratio: 0.8 — ABNORMAL LOW (ref 0.9–2.2)
Albumin: 2.7 g/dL — ABNORMAL LOW (ref 3.5–5.0)
Alkaline Phosphatase: 172 U/L — ABNORMAL HIGH (ref 37–117)
Anion Gap: 15 (ref 5.0–15.0)
BUN: 40 mg/dL — ABNORMAL HIGH (ref 7.0–21.0)
Bilirubin, Total: 0.5 mg/dL (ref 0.2–1.2)
CO2: 23 mEq/L (ref 17–29)
Calcium: 8.1 mg/dL — ABNORMAL LOW (ref 8.5–10.5)
Chloride: 98 mEq/L — ABNORMAL LOW (ref 99–111)
Creatinine: 6.9 mg/dL — ABNORMAL HIGH (ref 0.4–1.0)
Globulin: 3.5 g/dL (ref 2.0–3.6)
Glucose: 484 mg/dL — ABNORMAL HIGH (ref 70–100)
Potassium: 5.4 mEq/L — ABNORMAL HIGH (ref 3.5–5.3)
Protein, Total: 6.2 g/dL (ref 6.0–8.3)
Sodium: 136 mEq/L (ref 135–145)

## 2021-02-04 LAB — CBC AND DIFFERENTIAL
Absolute NRBC: 0 10*3/uL (ref 0.00–0.00)
Basophils Absolute Automated: 0.03 10*3/uL (ref 0.00–0.08)
Basophils Automated: 0.6 %
Eosinophils Absolute Automated: 0.09 10*3/uL (ref 0.00–0.44)
Eosinophils Automated: 1.8 %
Hematocrit: 34.4 % — ABNORMAL LOW (ref 34.7–43.7)
Hgb: 10.5 g/dL — ABNORMAL LOW (ref 11.4–14.8)
Immature Granulocytes Absolute: 0.02 10*3/uL (ref 0.00–0.07)
Immature Granulocytes: 0.4 %
Instrument Absolute Neutrophil Count: 3.59 10*3/uL (ref 1.10–6.33)
Lymphocytes Absolute Automated: 0.7 10*3/uL (ref 0.42–3.22)
Lymphocytes Automated: 13.7 %
MCH: 29.6 pg (ref 25.1–33.5)
MCHC: 30.5 g/dL — ABNORMAL LOW (ref 31.5–35.8)
MCV: 96.9 fL — ABNORMAL HIGH (ref 78.0–96.0)
MPV: 12.5 fL (ref 8.9–12.5)
Monocytes Absolute Automated: 0.68 10*3/uL (ref 0.21–0.85)
Monocytes: 13.3 %
Neutrophils Absolute: 3.59 10*3/uL (ref 1.10–6.33)
Neutrophils: 70.2 %
Nucleated RBC: 0 /100 WBC (ref 0.0–0.0)
Platelets: 102 10*3/uL — ABNORMAL LOW (ref 142–346)
RBC: 3.55 10*6/uL — ABNORMAL LOW (ref 3.90–5.10)
RDW: 14 % (ref 11–15)
WBC: 5.11 10*3/uL (ref 3.10–9.50)

## 2021-02-04 LAB — ECG 12-LEAD
Atrial Rate: 91 {beats}/min
P Axis: 74 degrees
P-R Interval: 138 ms
Q-T Interval: 396 ms
QRS Duration: 76 ms
QTC Calculation (Bezet): 487 ms
R Axis: 97 degrees
T Axis: 66 degrees
Ventricular Rate: 91 {beats}/min

## 2021-02-04 LAB — HIGH SENSITIVITY TROPONIN-I WITH DELTA
hs Troponin-I Delta: 5.9 ng/L
hs Troponin-I Delta: 11 ng/L
hs Troponin-I: 98.2 ng/L — CR
hs Troponin-I: 109.2 ng/L — CR

## 2021-02-04 LAB — COVID-19 (SARS-COV-2): SARS CoV 2 Overall Result: NOT DETECTED

## 2021-02-04 LAB — HIGH SENSITIVITY TROPONIN-I: hs Troponin-I: 92.3 ng/L — CR

## 2021-02-04 LAB — HEPATITIS B SURFACE ANTIGEN W/ REFLEX TO CONFIRMATION: Hepatitis B Surface Antigen: NONREACTIVE

## 2021-02-04 LAB — GFR: EGFR: 7.2

## 2021-02-04 MED ORDER — DEXTROSE 50 % IV SOLN
12.5000 g | INTRAVENOUS | Status: DC | PRN
Start: 2021-02-04 — End: 2021-02-08

## 2021-02-04 MED ORDER — SODIUM CHLORIDE 0.9 % IV BOLUS
100.0000 mL | INTRAVENOUS | Status: AC | PRN
Start: 2021-02-04 — End: 2021-02-04

## 2021-02-04 MED ORDER — NITROGLYCERIN IN D5W 200-5 MCG/ML-% IV SOLN
10.0000 ug/min | INTRAVENOUS | Status: DC
Start: 2021-02-04 — End: 2021-02-04

## 2021-02-04 MED ORDER — INSULIN GLARGINE 100 UNIT/ML SC SOLN
15.0000 [IU] | Freq: Every evening | SUBCUTANEOUS | Status: DC
Start: 2021-02-04 — End: 2021-02-07
  Administered 2021-02-04 – 2021-02-06 (×3): 15 [IU] via SUBCUTANEOUS
  Filled 2021-02-04 (×3): qty 15

## 2021-02-04 MED ORDER — METOPROLOL TARTRATE 5 MG/5ML IV SOLN
5.0000 mg | Freq: Once | INTRAVENOUS | Status: AC
Start: 2021-02-04 — End: 2021-02-04
  Administered 2021-02-04: 5 mg via INTRAVENOUS
  Filled 2021-02-04: qty 5

## 2021-02-04 MED ORDER — GLUCOSE 40 % PO GEL (WRAP)
15.0000 g | ORAL | Status: DC | PRN
Start: 2021-02-04 — End: 2021-02-08

## 2021-02-04 MED ORDER — PANTOPRAZOLE SODIUM 40 MG PO TBEC
40.0000 mg | DELAYED_RELEASE_TABLET | Freq: Every morning | ORAL | Status: DC
Start: 2021-02-04 — End: 2021-02-08
  Administered 2021-02-04 – 2021-02-08 (×5): 40 mg via ORAL
  Filled 2021-02-04 (×5): qty 1

## 2021-02-04 MED ORDER — SEVELAMER CARBONATE 800 MG PO TABS
800.0000 mg | ORAL_TABLET | Freq: Two times a day (BID) | ORAL | Status: DC
Start: 2021-02-04 — End: 2021-02-06
  Administered 2021-02-04 – 2021-02-06 (×4): 800 mg via ORAL
  Filled 2021-02-04 (×5): qty 1

## 2021-02-04 MED ORDER — OXYCODONE HCL 5 MG PO TABS
5.0000 mg | ORAL_TABLET | Freq: Four times a day (QID) | ORAL | Status: DC | PRN
Start: 2021-02-04 — End: 2021-02-08

## 2021-02-04 MED ORDER — GABAPENTIN 400 MG PO CAPS
400.0000 mg | ORAL_CAPSULE | Freq: Three times a day (TID) | ORAL | Status: DC
Start: 2021-02-04 — End: 2021-02-08
  Administered 2021-02-04 – 2021-02-08 (×13): 400 mg via ORAL
  Filled 2021-02-04 (×13): qty 1

## 2021-02-04 MED ORDER — INSULIN LISPRO 100 UNIT/ML SOLN (WRAP)
5.0000 [IU] | Freq: Three times a day (TID) | Status: DC
Start: 2021-02-04 — End: 2021-02-07
  Administered 2021-02-05 – 2021-02-06 (×5): 5 [IU] via SUBCUTANEOUS

## 2021-02-04 MED ORDER — ASPIRIN 81 MG PO CHEW
324.0000 mg | CHEWABLE_TABLET | Freq: Once | ORAL | Status: AC
Start: 2021-02-04 — End: 2021-02-04
  Administered 2021-02-04: 324 mg via ORAL
  Filled 2021-02-04: qty 4

## 2021-02-04 MED ORDER — SODIUM CHLORIDE 0.9 % IV BOLUS
250.0000 mL | INTRAVENOUS | Status: AC | PRN
Start: 2021-02-04 — End: 2021-02-04

## 2021-02-04 MED ORDER — HYDRALAZINE HCL 20 MG/ML IJ SOLN
10.0000 mg | Freq: Four times a day (QID) | INTRAMUSCULAR | Status: DC | PRN
Start: 2021-02-04 — End: 2021-02-08
  Administered 2021-02-06 – 2021-02-07 (×2): 10 mg via INTRAVENOUS
  Filled 2021-02-04 (×3): qty 1

## 2021-02-04 MED ORDER — INSULIN LISPRO 100 UNIT/ML SOLN (WRAP)
2.0000 [IU] | Freq: Three times a day (TID) | Status: DC
Start: 2021-02-04 — End: 2021-02-08
  Administered 2021-02-04 (×2): 10 [IU] via SUBCUTANEOUS
  Administered 2021-02-05: 4 [IU] via SUBCUTANEOUS
  Administered 2021-02-05: 6 [IU] via SUBCUTANEOUS
  Administered 2021-02-06: 8 [IU] via SUBCUTANEOUS
  Administered 2021-02-06: 6 [IU] via SUBCUTANEOUS
  Administered 2021-02-06: 8 [IU] via SUBCUTANEOUS
  Administered 2021-02-07: 6 [IU] via SUBCUTANEOUS
  Administered 2021-02-07: 4 [IU] via SUBCUTANEOUS
  Administered 2021-02-07: 8 [IU] via SUBCUTANEOUS
  Administered 2021-02-08: 6 [IU] via SUBCUTANEOUS
  Administered 2021-02-08: 4 [IU] via SUBCUTANEOUS
  Filled 2021-02-04: qty 18
  Filled 2021-02-04 (×2): qty 30
  Filled 2021-02-04 (×2): qty 6
  Filled 2021-02-04: qty 30
  Filled 2021-02-04: qty 15
  Filled 2021-02-04 (×2): qty 30

## 2021-02-04 MED ORDER — ASPIRIN 81 MG PO CHEW
81.0000 mg | CHEWABLE_TABLET | Freq: Every day | ORAL | Status: DC
Start: 2021-02-05 — End: 2021-02-08
  Administered 2021-02-05 – 2021-02-08 (×4): 81 mg via ORAL
  Filled 2021-02-04 (×4): qty 1

## 2021-02-04 MED ORDER — ACETAMINOPHEN 325 MG PO TABS
650.0000 mg | ORAL_TABLET | Freq: Four times a day (QID) | ORAL | Status: AC | PRN
Start: 2021-02-04 — End: 2021-02-04

## 2021-02-04 MED ORDER — ATORVASTATIN CALCIUM 40 MG PO TABS
40.0000 mg | ORAL_TABLET | Freq: Every evening | ORAL | Status: DC
Start: 2021-02-04 — End: 2021-02-08
  Administered 2021-02-04 – 2021-02-07 (×4): 40 mg via ORAL
  Filled 2021-02-04 (×4): qty 1

## 2021-02-04 MED ORDER — AMLODIPINE BESYLATE 5 MG PO TABS
10.0000 mg | ORAL_TABLET | Freq: Every day | ORAL | Status: DC
Start: 2021-02-04 — End: 2021-02-08
  Administered 2021-02-04 – 2021-02-08 (×5): 10 mg via ORAL
  Filled 2021-02-04 (×5): qty 2

## 2021-02-04 MED ORDER — DEXTROSE 10 % IV BOLUS
12.5000 g | INTRAVENOUS | Status: DC | PRN
Start: 2021-02-04 — End: 2021-02-08

## 2021-02-04 MED ORDER — SEVELAMER CARBONATE 800 MG PO TABS
800.0000 mg | ORAL_TABLET | Freq: Three times a day (TID) | ORAL | Status: DC
Start: 2021-02-04 — End: 2021-02-04
  Filled 2021-02-04 (×3): qty 1

## 2021-02-04 MED ORDER — GLUCAGON 1 MG IJ SOLR (WRAP)
1.0000 mg | INTRAMUSCULAR | Status: DC | PRN
Start: 2021-02-04 — End: 2021-02-08

## 2021-02-04 MED ORDER — DEXTROMETHORPHAN POLISTIREX ER 30 MG/5ML PO SUER
30.0000 mg | Freq: Two times a day (BID) | ORAL | Status: DC
Start: 2021-02-04 — End: 2021-02-08
  Administered 2021-02-04 – 2021-02-08 (×8): 30 mg via ORAL
  Filled 2021-02-04 (×16): qty 5

## 2021-02-04 MED ORDER — ALBUTEROL SULFATE (2.5 MG/3ML) 0.083% IN NEBU
5.0000 mg | INHALATION_SOLUTION | Freq: Four times a day (QID) | RESPIRATORY_TRACT | Status: DC | PRN
Start: 2021-02-04 — End: 2021-02-08
  Administered 2021-02-04 – 2021-02-05 (×2): 5 mg via RESPIRATORY_TRACT
  Filled 2021-02-04 (×2): qty 6

## 2021-02-04 MED ORDER — HYDRALAZINE HCL 20 MG/ML IJ SOLN
10.0000 mg | Freq: Once | INTRAMUSCULAR | Status: AC
Start: 2021-02-04 — End: 2021-02-04
  Administered 2021-02-04: 10 mg via INTRAVENOUS
  Filled 2021-02-04: qty 1

## 2021-02-04 MED ORDER — INSULIN LISPRO 100 UNIT/ML SOLN (WRAP)
1.0000 [IU] | Freq: Every evening | Status: DC
Start: 2021-02-04 — End: 2021-02-08
  Administered 2021-02-05: 6 [IU] via SUBCUTANEOUS
  Administered 2021-02-06: 4 [IU] via SUBCUTANEOUS
  Filled 2021-02-04: qty 18
  Filled 2021-02-04: qty 12

## 2021-02-04 MED ORDER — FUROSEMIDE 10 MG/ML IJ SOLN
120.0000 mg | Freq: Once | INTRAMUSCULAR | Status: AC
Start: 2021-02-04 — End: 2021-02-04
  Administered 2021-02-04: 120 mg via INTRAVENOUS
  Filled 2021-02-04: qty 12

## 2021-02-04 MED ORDER — NITROGLYCERIN 0.4 MG SL SUBL
0.4000 mg | SUBLINGUAL_TABLET | SUBLINGUAL | Status: AC | PRN
Start: 2021-02-04 — End: 2021-02-04
  Administered 2021-02-04 (×3): 0.4 mg via SUBLINGUAL
  Filled 2021-02-04: qty 25

## 2021-02-04 NOTE — Progress Notes (Signed)
FOUR EYES SKIN ASSESSMENT NOTE    18-Jun-1955  [id number]         POC Initiated for Risk for Altered Skin Yes    Patient Assessed for Correct Mattress Surface No    *At risk patients with Braden Score less than 12 must be considered for specialty bed    Mepilex or Adhesive Foam Dressing applied to sacrum/heel if any PI risk factors present: No      If Wound/Pressure Injury present:    Wound/PI assessment documented in EHR: Yes    Admitting physician notified: No      Wound consult ordered: No      No ulcers of decubitus noted.     Edwena Felty, RN  February 04, 2021  2:37 PM    Second RN/PCT Name:

## 2021-02-04 NOTE — ED Notes (Signed)
Bed: BL18  Expected date:   Expected time:   Means of arrival:   Comments:  Medic 205

## 2021-02-04 NOTE — ED Provider Notes (Signed)
ED PHYSICIAN NOTE              Patient: [name]   MRN:  [id number]          History of Present Illness            Chief Complaint:   Chief Complaint   Patient presents with    Difficulty breathing    Cough         [name] is a 66 y.o. female with a past medical history of type 2 diabetes, end-stage renal disease on dialysis Monday Wednesday Friday and noncompliant with this presenting to the ED with gradually worsening shortness of breath over the course of the last 2 days.  Patient was last dialyzed 4 days ago, missed her appointment yesterday.  Reports associated cough that is nonproductive.  Patient still makes urine, is not on diuretic.  Reports no associated chest pain, fevers, chills.  Last time she was a short of breath she required hospitalization for dialysis and fluid removal.  Symptoms are constant, worse with exertion, moderate intensity.         Medical Decision Making      I am the first provider for this patient.     I reviewed the vital signs, available nursing notes, past medical history, past surgical history, family history and social history.     Vital Signs: Reviewed the patient's vital signs during ED stay.  I reviewed pt's pulse oxymetry and cardiac monitor values, as relevant to the case.     Pulse Oximetry Analysis: 99% on 3 L nasal cannula  Telemetry: Sinus rhythm rate 70s no ectopy     Record Review: The following, if applicable to the case, were reviewed and noted:    Old medical records.  Nursing notes.  Outside records.     EKG:  Interpreted by the Emergency Physician.  Sinus rhythm rate of 91, right axis deviation, incomplete right bundle branch block, no acute ST-T segment change in comparison to previous dated January 05, 2021      H+P as documented, VS reviewed.   66 y.o. pt presenting with above.     With minimal exertion patient desaturates to mid 80s, subsequently placed on nonrebreather and then de-escalated to 3 L nasal cannula.  Diffuse rhonchorous  breath sounds throughout all lung fields, tachypneic at rest and dyspneic with speech.  Hypertensive to 200s over 100s upon arrival.  Concern for flash pulmonary edema secondary to hypertensive emergency due to noncompliance on dialysis regimen.    Chest x-ray corroborates mild pulmonary edema, screening lab work is significant for glucose to 484, creatinine of 6.9, potassium 5.4, hemoglobin hematocrit appear stable.  EKG is without acute ST-T segment change.      High-sensitivity troponin trended, initial 92.3-90 8-1 09.  These were discussed with Dr. Madelynn Done of [facility] cardiology, patient does have a history of elevated troponin in the setting of her pulmonary edema from hypertensive emergency as documented in chart review from her recent hospitalization.  With reassuring EKG and without concurrent chest pain, less likely to be NSTEMI.  Still, with rise in serial troponin will give 324 chewable aspirin for cardioprotection. Will defer further anticoagulation. Cardiology to consult this morning for further guidance.     With regard to her blood pressure, patient did intermittently respond well to sublingual nitroglycerin, however blood pressures rebounded after each dose.  Nitroglycerin infusion was discussed with ICU, instead recommend trialing antihypertensives.  Patient responded well to hydralazine  and Lopressor while in the ED.    Patient ultimately requires dialysis, given her new oxygen requirement and elevation in troponin, nephrology and I agree that dialysis should take place in an inpatient setting.  Discussed with Dr. Randel Pigg who is patient's nephrologist.  Recommends also giving 120 mg IV Lasix for diuresis in interim as well.     The above-stated information was discussed with pt at bedside.  All questions and concerns were addressed prior to disposition.    SEP-1 documentation    Please select option A (exclude), B (severe sepsis), or C (septic shock) from the drop-down menu below:    A.  ------------Severe Sepsis/Septic Shock Exclusion----------------------------    Pt is excluded from severe sepsis or septic shock consideration at 0551 [enter time of bed request placement] because of one or more reasons below:    All SIRS, abnl vitals, organ dysfunction are NOT due to severe sepsis or septic shock, but due to alternative cause: Other: Flash pulmonary edema, renal failure, hypertensive emergency [list cause].     The timestamp from the preceding paragraph above also applies to every infectious and/or SEP-1-related diagnosis in Clinical Impression or MDM section of this note, as well as to any mention of the words "sepsis", "infection" or any infection/sepsis-related word or phrase in any part of this note.       CRITICAL CARE: The high probability of sudden, clinically significant deterioration in the patient's condition required the highest level of my preparedness to intervene urgently.    The services I provided to this patient were to treat and/or prevent clinically significant deterioration that could result in: Cardiogenic shock, cardio pulmonary arrest, multiorgan failure.  Services included the following: chart data review, reviewing nursing notes and/or old charts, documentation time, consultant collaboration regarding findings and treatment options, medication orders and management, direct patient care, re-evaluations, vital sign assessments and ordering, interpreting and reviewing diagnostic studies/lab tests.    Aggregate critical care time was 80 minutes, which includes only time during which I was engaged in work directly related to the patient's care, as described above, whether at the bedside or elsewhere in the Emergency Department.  It did not include time spent performing other reported procedures or the services of residents, students, nurses or physician assistants.         Physical Exam           Vitals:    02/04/21 0700   BP: 143/68   Pulse: 78   Resp: (!) 28   Temp:    SpO2:  99%         Physical Exam  Constitutional: Chronically ill-appearing female  HENT: conjunctiva normal, moist mucous membranes  Neck: supple, no cervical LAD  Respiratory: Bilateral rhonchorous breath sounds, new oxygen requirement at 3 L nasal cannula, 5-6 word dyspnea with speech  Cardiovascular: Regular rate and rhythm, capillary refill <2 seconds, no bipedal edema  Abdomen: soft, non-tender, non-distended  Neuro: A+Ox3, answers questions appropriately   Skin: Warm and dry, no rashes  Psych: Normal mood and affect          Diagnosis and Disposition          1. Acute pulmonary edema        2. Hypertensive emergency        3. NSTEMI (non-ST elevated myocardial infarction)              I have discussed this case with Dr. Marylen Ponto, who accepts patient for admission and requests Observation  IMCU bed.            Past History        Past medical history:  Past Medical History:   Diagnosis Date    Chronic obstructive pulmonary disease     Diabetes mellitus     Hyperlipidemia     Hypertension     Sleep apnea 2018     Past surgical history:  Past Surgical History:   Procedure Laterality Date    EGD, BIOPSY N/A 05/14/2019    Procedure: EGD, BIOPSY;  Surgeon: Ronie Spies, MD;  Location: [facility];  Service: Gastroenterology;  Laterality: N/A;    EXPLORATORY LAPAROTOMY N/A 05/27/2019    Procedure: EXPLORATORY LAPAROTOMY;  Surgeon: Herbert Moors., MD;  Location: [facility];  Service: General;  Laterality: N/A;    JOINT REPLACEMENT      shoulder    JOINT REPLACEMENT      knee    OVARY SURGERY Left     Removal    REMOVAL, FOREIGN BODY N/A 05/27/2019    Procedure: REMOVAL, PERITONEAL DIALYSIS CATHETER;  Surgeon: Herbert Moors., MD;  Location: [facility];  Service: General;  Laterality: N/A;    TUNNELED CATH CHECK/CHANGE ([facility]MCATH) N/A 07/03/2019    Procedure: TUNNELED CATH CHECK/CHANGE;  Surgeon: Maureen Ralphs, MD;  Location: [facility];  Service: Interventional Radiology;  Laterality: N/A;    TUNNELED  CATH PLACEMENT ([facility]MCATH) Bilateral 05/23/2019    Procedure: TUNNELED CATH PLACEMENT;  Surgeon: Jacques Earthly, MD;  Location: [facility];  Service: Interventional Radiology;  Laterality: Bilateral;    TUNNELED CATH PLACEMENT ([facility]ATH) Right 07/31/2019    Procedure: TUNNELED CATH PLACEMENT;  Surgeon: Jacques Earthly, MD;  Location: [facility];  Service: Interventional Radiology;  Laterality: Right;     Family history:  Family History   Problem Relation Age of Onset    Diabetes Mother     Hypertension Mother     Diabetes Father     Hypertension Father     Diabetes Sister     Hypertension Sister     Diabetes Brother     Hypertension Brother     Diabetes Paternal Aunt     Diabetes Paternal Uncle      Social history:   Social History     Tobacco Use    Smoking status: Never    Smokeless tobacco: Never   Vaping Use    Vaping Use: Never used   Substance Use Topics    Alcohol use: Never    Drug use: Never     Allergies: Reviewed and up-to-date in pt's chart       Allergies and Medications          Allergies   Allergen Reactions    Lisinopril Hives    Metformin Hives         Home Medications       Med List Status: In Progress Set By: Ezekiel Slocumb, Dorathy Kinsman, RN at 02/04/2021  2:14 AM              albuterol sulfate HFA (PROVENTIL) 108 (90 Base) MCG/ACT inhaler     Inhale 2 puffs into the lungs every 6 (six) hours as needed for Wheezing or Shortness of Breath     amLODIPine (NORVASC) 10 MG tablet     Take 1 tablet by mouth every 24 hours     aspirin 81 MG chewable tablet     Chew 81 mg by mouth daily  atorvastatin (LIPITOR) 40 MG tablet     Take 40 mg by mouth daily     carvedilol (COREG) 25 MG tablet     Take 1 tablet by mouth 2 (two) times daily with meals     dexlansoprazole (Dexilant) 60 MG XR capsule          gabapentin (NEURONTIN) 400 MG capsule     Take 1 capsule by mouth every 8 (eight) hours     hydrALAZINE (APRESOLINE) 50 MG tablet     Take 2 tablets by mouth 2 (two) times daily     Insulin Degludec  (TRESIBA SC)     Inject 35 Unit into the skin     Insulin Regular Human (NovoLIN R FlexPen) 100 UNIT/ML Solution Pen-injector     15 Unit     losartan (COZAAR) 100 MG tablet     Take 1 tablet by mouth daily     metoprolol tartrate (LOPRESSOR) 100 MG tablet     Take 1 tablet by mouth every 12 (twelve) hours     midodrine (PROAMATINE) 5 MG tablet     Take by mouth     omeprazole (PriLOSEC) 20 MG capsule          oxyCODONE (ROXICODONE) 10 MG immediate release tablet     Take 1 tablet by mouth     sevelamer (RENVELA) 800 MG tablet     Take 800 mg by mouth 3 (three) times daily with meals                                             Review of Systems        Other than pertinent positives as above in HPI, a complete 10-system ROS was reviewed and negative.       Diagnostic Studies          Labs:  Labs Reviewed   CBC AND DIFFERENTIAL - Abnormal; Notable for the following components:       Result Value    Hgb 10.5 (*)     Hematocrit 34.4 (*)     Platelets 102 (*)     RBC 3.55 (*)     MCV 96.9 (*)     MCHC 30.5 (*)     All other components within normal limits   COMPREHENSIVE METABOLIC PANEL - Abnormal; Notable for the following components:    Glucose 484 (*)     BUN 40.0 (*)     Creatinine 6.9 (*)     Potassium 5.4 (*)     Chloride 98 (*)     Calcium 8.1 (*)     Albumin 2.7 (*)     Alkaline Phosphatase 172 (*)     Albumin/Globulin Ratio 0.8 (*)     All other components within normal limits   HIGH SENSITIVITY TROPONIN-I - Abnormal; Notable for the following components:    hs Troponin-I 92.3 (*)     All other components within normal limits   HIGH SENSITIVITY TROPONIN-I WITH DELTA - Abnormal; Notable for the following components:    hs Troponin-I 98.2 (*)     All other components within normal limits   HIGH SENSITIVITY TROPONIN-I WITH DELTA - Abnormal; Notable for the following components:    hs Troponin-I 109.2 (*)     All other components within normal limits   GLUCOSE WHOLE BLOOD - POCT -  Abnormal; Notable for the following  components:    Whole Blood Glucose POCT 344 (*)     All other components within normal limits   COVID-19 (SARS-COV-2)    Narrative:     o Collect and clearly label specimen type:  o PREFERRED-Upper respiratory specimen: One Nasal Swab in  [employer].  o Hand deliver to laboratory ASAP  Indication for testing->Extended care facility admission to  semi private room  Screening   GFR   HIGH SENSITIVITY TROPONIN-I WITH DELTA   BETA HCG, TUMOR, QUANTITATIVE         Radiological Studies:  Chest AP Portable    Result Date: 02/04/2021  HISTORY:Chest Pain. COMPARISON: 01/05/2021 TECHNIQUE: XR CHEST AP PORTABLE FINDINGS: Lines and tubes: None Lungs and hila: There is mild pulmonary edema. There is no pleural effusion. There is no pneumothorax. Heart and Mediastinum: The cardiac silhouette is enlarged and stable in size. Bones and soft tissues: No acute abnormality. Upper abdomen: Normal     Mild pulmonary edema. Delaney Meigs, MD  02/04/2021 3:04 AM         Rochel Brome, DO  02/04/21 0940

## 2021-02-04 NOTE — Consults (Signed)
[place] REPORT  [facility]        Date Time: 02/04/21 9:16 AM  Patient Name: [name]  Requesting Physician: Canary Brim, DO  Consulting Cardiologist:  Jamelle Rushing, MD  MRN:  [id number]  CSN:   [id number]  Date of Admission:  02/04/2021       Reason for Consultation:   Shortness of breath    History:   [name] is a 66 y.o. female admitted on 02/04/2021.  We have been asked by Canary Brim, DO,  to provide cardiac consultation, regarding shortness of breath.    The patient reports feeling feverish and generally unwell two days ago. She missed dialysis on Wednesday, but continued to take her antihypertensive medications. She has known history of ESRD, hypertension and hyperlipidemia. She was recently admitted with flash pulmonary edema in the setting of missed dialysis in December 2022.    On arrival this morning, he initial blood pressure was 228/100 and was recorded as high as 230/146mmHg.     Her HS-troponin was initial 92 and has increased to 109. During her last admission she had an hs trop in the 90s. Her EKG today is non-ischemic.    Labs today are otherwise significant for blood sugar of 484 and potassium 5.4.    Past Medical History:     Past Medical History:   Diagnosis Date    Chronic obstructive pulmonary disease     Diabetes mellitus     Hyperlipidemia     Hypertension     Sleep apnea 2018       Past Surgical History:     Past Surgical History:   Procedure Laterality Date    EGD, BIOPSY N/A 05/14/2019    Procedure: EGD, BIOPSY;  Surgeon: Ronie Spies, MD;  Location: [facility];  Service: Gastroenterology;  Laterality: N/A;    EXPLORATORY LAPAROTOMY N/A 05/27/2019    Procedure: EXPLORATORY LAPAROTOMY;  Surgeon: Herbert Moors., MD;  Location: [facility];  Service: General;  Laterality: N/A;    JOINT REPLACEMENT      shoulder    JOINT REPLACEMENT      knee    OVARY SURGERY Left     Removal    REMOVAL, FOREIGN BODY N/A  05/27/2019    Procedure: REMOVAL, PERITONEAL DIALYSIS CATHETER;  Surgeon: Herbert Moors., MD;  Location: [facility];  Service: General;  Laterality: N/A;    TUNNELED CATH CHECK/CHANGE (PERMCATH) N/A 07/03/2019    Procedure: TUNNELED CATH CHECK/CHANGE;  Surgeon: Maureen Ralphs, MD;  Location: [facility];  Service: Interventional Radiology;  Laterality: N/A;    TUNNELED CATH PLACEMENT (PERMCATH) Bilateral 05/23/2019    Procedure: TUNNELED CATH PLACEMENT;  Surgeon: Jacques Earthly, MD;  Location: [facility];  Service: Interventional Radiology;  Laterality: Bilateral;    TUNNELED CATH PLACEMENT (PERMCATH) Right 07/31/2019    Procedure: TUNNELED CATH PLACEMENT;  Surgeon: Jacques Earthly, MD;  Location: [facility];  Service: Interventional Radiology;  Laterality: Right;       Family History:     Family History   Problem Relation Age of Onset    Diabetes Mother     Hypertension Mother     Diabetes Father     Hypertension Father     Diabetes Sister     Hypertension Sister     Diabetes Brother     Hypertension Brother     Diabetes Paternal Aunt     Diabetes Paternal Uncle  Social History:     Social History     Socioeconomic History    Marital status: Legally Separated     Spouse name: Not on file    Number of children: Not on file    Years of education: Not on file    Highest education level: Not on file   Occupational History    Not on file   Tobacco Use    Smoking status: Never    Smokeless tobacco: Never   Vaping Use    Vaping Use: Never used   Substance and Sexual Activity    Alcohol use: Never    Drug use: Never    Sexual activity: Not Currently   Other Topics Concern    Not on file   Social History Narrative    Not on file     Social Determinants of Health     Financial Resource Strain: Not on file   Food Insecurity: Not on file   Transportation Needs: Not on file   Physical Activity: Not on file   Stress: Not on file   Social Connections: Not on file   Intimate Partner Violence: Not on file    Housing Stability: Not on file       Allergies:     Allergies   Allergen Reactions    Lisinopril Hives    Metformin Hives       Medications:   (Not in a hospital admission)       Current Facility-Administered Medications   Medication Dose Route Frequency         Review of Systems:    Comprehensive review of systems including constitutional, eyes, ears, nose, mouth, throat, cardiovascular, GI, GU, musculoskeletal, integumentary, respiratory, neurologic, psychiatric, and endocrine is negative other than what is mentioned already in the history of present illness    Physical Exam:     VITAL SIGNS PHYSICAL EXAM   Vitals:    02/04/21 0800   BP: 158/69   Pulse: 80   Resp: (!) 28   Temp:    SpO2: 98%     Temp (24hrs), Avg:98.2 F (36.8 C), Min:97.6 F (36.4 C), Max:98.8 F (37.1 C)      Intake and Output Summary (Last 24 hours) at Date Time  No intake or output data in the 24 hours ending 02/04/21 0916   Physical Exam  General: awake, alert, breathing comfortably, no acute distress  Head: normocephalic  Eyes: Lids & conjunctiva normal  Cardiovascular: regular rate and rhythm, soft S1, S2, no S3, no S4, no murmurs, rubs or gallops  Neck: JVD difficult to assess (habitus)  Lungs: Bilateral crackles.  Abdomen: soft, non-tender.  Extremities: no edema  Pulse: 2+ radial pulses.  Neurological: No gross motor defect  Psychiatric: Alert and oriented X3, mood and affect normal  Musculoskeletal: normal strength and tone     Labs Reviewed:     Recent Labs   Lab 02/04/21  0645 02/04/21  0455   hs Troponin-I 109.2* 98.2*   hs Troponin-I Delta 11.0 5.9             Recent Labs   Lab 02/04/21  0227   Bilirubin, Total 0.5   Protein, Total 6.2   Albumin 2.7*   ALT 23   AST (SGOT) 23             Recent Labs   Lab 02/04/21  0227   WBC 5.11   Hgb 10.5*   Hematocrit 34.4*   Platelets 102*  Recent Labs   Lab 02/04/21  0227   Sodium 136   Potassium 5.4*   Chloride 98*   CO2 23   BUN 40.0*   Creatinine 6.9*   EGFR 7.2   Glucose 484*    Calcium 8.1*       DIAGNOSTIC    EKG:   Sinus rhythm  Normal axis  QTc prolongation  RsR'  Delayed R wave  No acute STT changes  Overall similar to EKG from January 05, 2021    chest X-ray:  Enlarged heart  Mild pulmonary edema    Echo:  June 2021:  Normal LV EF  Small to moderate pericardial effusion    Assessment:   Dyspnea - There is likely some contributing of flash pulmonary edema in the setting of hypertensive crisis and volume overload due to missed dialysis. She has a minimally elevated troponin, which she often has, and likely represents demand ischemia in hypertension and ESRD. The patient likely has chronic coronary artery disease, but I do not find her presentation consistent with acute coronary syndrome.  Hypertensive crisis  Elevated troponin  ESRD  Diabetes Mellitus    Recommendations:   Dialysis for both volume removal and hypertension.  Would keep BP < 381 systolic over the course of the day today and can titrate down further tomorrow.  Re-introduce home amlodipine this morning. Would add coreg/hydralazine over the course of the day as blood pressure tolerates.  Treat with daily aspirin 81mg  and statin for presumed coronary disease.  Repeat an echo.        Signed by: Jamelle Rushing, MD    [place] [phone] or [phone] (8am-5pm)  Office/Pager: [phone]  NP [facility] [phone]   After hours, non urgent consult line [phone]  After Hours, urgent consults [phone]

## 2021-02-04 NOTE — Progress Notes (Signed)
Initial Case Management Assessment and Discharge [place]   Patient Name: [name], [name]   Date of Birth 1955-04-16   Attending Physician: Lisa Roca, MD   Primary Care Physician: Horton Marshall, MD   Length of Stay 0   Reason for Consult / Chief Complaint Difficulty Breathing, CM IDPA         Situation   Admission DX:   1. Acute pulmonary edema    2. Hypertensive emergency    3. NSTEMI (non-ST elevated myocardial infarction)        A/O Status: X 3    LACE Score: 9    Patient admitted from: ER  Admission Status: inpatient    [place]: Spouse  Name: [name]   Phone number: [phone]       Background     Advanced directive:   <no information>    Code Status:   Prior     Residence: Apartment    PCP: Horton Marshall, MD  Patient Contact:   [phone] (home)     [phone] (mobile)     Emergency contact:   Extended Emergency Contact Information  Primary Emergency Contact: okoronwo,shannon  Mobile Phone: [phone]  Relation: Daughter  Interpreter needed? No  Secondary Emergency Contact: Crane  Mobile Phone: [phone]  Relation: Spouse      ADL/IADL's: Assistive Device  Previous Level of function: 5 Supervision     DME: Front wheeled walker   Single point cane    Pharmacy:     [phone] - [address]  [place] [address]  Phone: [phone] Fax: [phone]      Prescription Coverage: Yes    Home Health: The patient is receiving home health services, including a home health aide. The Pt shared she forgot the name of the agency who provides her services. The Pt shared her aide comes 7 days per week for 8 hours each time.     Previous SNF/AR: The pt shared she has been to [facility] Vaccine Status: The Pt shared she is not fully vaccinated for COVID-19    Date First IMM given: N/A   UAI on file?: No  Transport for discharge? Mode of transportation: Sports coach -  Family/Friend to drive patient  Agreeable to Home with family post-discharge:  Yes     Barriers to [place]      BARRIERS TO DISCHARGE: N/A      Recommendation   D/C Plan A: Home with family    D/C Plan B: Home with home health    D/C Plan C: SNF        Casey Burkitt, MSW, Supervisee in Social Work, Tourist information centre manager I

## 2021-02-04 NOTE — Plan of Care (Signed)
Problem: Renal Instability  Goal: Fluid and electrolyte balance are achieved/maintained  Outcome: Progressing  Flowsheets (Taken 02/04/2021 1532)  Fluid and electrolyte balance are achieved/maintained:   Monitor/assess lab values and report abnormal values   Monitor daily weight   Assess for confusion/personality changes   Observe for cardiac arrhythmias  Goal: Free from infection  Outcome: Progressing  Flowsheets (Taken 02/04/2021 1532)  Free from infection: Monitor/assess for signs and symptoms of infection     Problem: Patient Receiving Advanced Renal Therapies  Goal: Therapy access site remains intact  Outcome: Progressing  Flowsheets (Taken 02/04/2021 1532)  Therapy access site remains intact: Assess therapy access site

## 2021-02-04 NOTE — Plan of Care (Signed)
Problem: Safety  Goal: Patient will be free from injury during hospitalization  Outcome: Progressing  Flowsheets (Taken 02/04/2021 2356)  Patient will be free from injury during hospitalization:   Assess patient's risk for falls and implement fall prevention plan of care per policy   Provide and maintain safe environment   Use appropriate transfer methods   Hourly rounding   Include patient/ family/ care giver in decisions related to safety   Ensure appropriate safety devices are available at the bedside     Problem: Pain  Goal: Pain at adequate level as identified by patient  Outcome: Progressing  Flowsheets (Taken 02/04/2021 2356)  Pain at adequate level as identified by patient:   Identify patient comfort function goal   Reassess pain within 30-60 minutes of any procedure/intervention, per Pain Assessment, Intervention, Reassessment (AIR) Cycle   Assess pain on admission, during daily assessment and/or before any "as needed" intervention(s)     Problem: Renal Instability  Goal: Fluid and electrolyte balance are achieved/maintained  Outcome: Progressing  Flowsheets (Taken 02/04/2021 2356)  Fluid and electrolyte balance are achieved/maintained:   Monitor intake and output every shift   Monitor/assess lab values and report abnormal values   Monitor daily weight   Assess and reassess fluid and electrolyte status  Goal: Free from infection  Outcome: Progressing  Flowsheets (Taken 02/04/2021 2356)  Free from infection: Monitor/assess for signs and symptoms of infection

## 2021-02-04 NOTE — Progress Notes (Signed)
Pt is contact isolation, PPE done per protocol.  Pt alert and oriented. Pt denied pain and sob. No other complain given. LAVG  access is optimally working post dialysis. No bleeding on access site post tx.  Hemodialysis ended and pt. to finish 3.5hrs  tx.  Able to removed Net Fluid of 3.5L. Pt s/b Dr. Conley Canal during rounds.  Reports were given to Primary nurse.        02/04/21 1906   Treatment Summary   Time Off Machine 1845   Duration of Treatment (Hours) 3.5   Treatment Type 1:1   Dialyzer Clearance Moderately streaked   Fluid Volume Off (mL) 3900   Prime Volume (mL) 200   Rinseback Volume (mL) 200   Fluid Given: Normal Saline (mL) 0   Fluid Given: PRBC  0 mL   Fluid Given: Albumin (mL) 0   Fluid Given: Other (mL) 0   Total Fluid Given 400   Hemodialysis Net Fluid Removed 3500   Post Treatment Assessment   Post-Treatment Weight (Kg)   (-3.5L.)   Patient Response to Treatment tolerated   Information for Next Treatment tomorrow   Additional Dialyzer Used 0   Graft/Fistula Left   No placement date or time found.   Access Type: Arteriovenous Fistula  Orientation: Left  Graft/Fistula Location: Upper Arm   Fistula/ Graft Assessment Abnormalities WDL   Needle Size 16 Gauge   Cannulation Sites held Arterial (min) 8   Cannulation Sites held Venous (min) 8   Hemostasis Achieved Yes   Vitals   Temp 98.1 F (36.7 C)   Heart Rate 85   Resp Rate 20   BP 164/69   SpO2 100 %   O2 Device Nasal cannula   Assessment   Mental Status Alert;Oriented   Cardiac Regularity Irregular   Cardiac Rhythm Atrial Fib   Respiratory Pattern Regular   Bilateral Breath Sounds Diminished   R Breath Sounds Diminished   L Breath Sounds Diminished   Edema  WDL   General Skin Color Appropriate for ethnicity   Skin Condition/Temp Warm;Dry   Abdomen Inspection Rounded   GI Symptoms None   Mobility Bed   Pain Assessment   Charting Type Assessment   Pain Scale Used Numeric Scale (0-10)   Numeric Pain Scale   Pain Score 0   POSS Score 1   Education    Person taught Patient   Knowledge basis Substantial   Topics taught Fluid Management;K+;Procedure   Teaching Tools Explain   [name] Understanding   Bedside Nurse Communication   Name of bedside RN - post dialysis Edwena Felty, RN.

## 2021-02-04 NOTE — ED Notes (Signed)
.    [place]  ED NURSING NOTE FOR THE RECEIVING INPATIENT NURSE   ED NURSE Cleotis Nipper   [facility] [address]   ED CHARGE RN [phone]   ADMISSION INFORMATION   [name] is a 66 y.o. female admitted with a diagnosis of:    1. Hypertensive emergency         Isolation: Contact MRSA   Allergies: Lisinopril and Metformin   Holding Orders confirmed? Yes   Belongings Documented? Yes   Home medications sent to pharmacy confirmed? 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      COVID Test sent to lab? Yes   VITAL SIGNS   Time BP Temp Pulse Resp SpO2   0605 158/68 98.8 73 21 100   CT / NIH   CT Head ordered on this patient?  No   NIH/Dysphagia assessment done prior to admission? No   PERSONAL PROTECTIVE EQUIPMENT   Gloves   LAB RESULTS   Labs Reviewed   CBC AND DIFFERENTIAL - Abnormal; Notable for the following components:       Result Value    Hgb 10.5 (*)     Hematocrit 34.4 (*)     Platelets 102 (*)     RBC 3.55 (*)     MCV 96.9 (*)     MCHC 30.5 (*)     All other components within normal limits   COMPREHENSIVE METABOLIC PANEL - Abnormal; Notable for the following components:    Glucose 484 (*)     BUN 40.0 (*)     Creatinine 6.9 (*)     Potassium 5.4 (*)     Chloride 98 (*)     Calcium 8.1 (*)     Albumin 2.7 (*)     Alkaline Phosphatase 172 (*)     Albumin/Globulin Ratio 0.8 (*)     All other components within normal limits   HIGH SENSITIVITY TROPONIN-I - Abnormal; Notable for the following components:    hs Troponin-I 92.3 (*)     All other components within normal limits   HIGH SENSITIVITY TROPONIN-I WITH DELTA - Abnormal; Notable for the following components:    hs Troponin-I 98.2 (*)     All other components within normal limits   GLUCOSE WHOLE BLOOD - POCT - Abnormal; Notable for the following  components:    Whole Blood Glucose POCT 344 (*)     All other components within normal limits   COVID-19 (SARS-COV-2)    Narrative:     o Collect and clearly label specimen type:  o PREFERRED-Upper respiratory specimen: One Nasal Swab in  [employer].  o Hand deliver to laboratory ASAP  Indication for testing->Extended care facility admission to  semi private room  Screening   GFR   HIGH SENSITIVITY TROPONIN-I WITH DELTA   BETA HCG, TUMOR, QUANTITATIVE   HIGH SENSITIVITY TROPONIN-I WITH DELTA          Ticket to Ride Printed: Yes

## 2021-02-04 NOTE — Plan of Care (Signed)
Brief ICU Consult Note:    55F with ESRD on HD MWF, poorly controlled DM and hypertension, who presents to ED with worsening shortness of breath. Reports missing recent dialysis session secondary to "not feeling well," and noncompliance with insulin and home BP medications. Hypertensive in the ED with SBP peak in 220s. ICU consulted for admission to ICU for IV anti-hypertensives. On brief chart review and interview with the patient, no evidence of end-organ damage. At time of consult, patient had received 120mg  lasix, no other anti-hypertensive agents. Recommended administration of home BP regimen prior to initiation of cardene/NTG gtts. Patient evaluated in the ED after administration of 10 of IV hydralazine x 1 and 5 of IV metop x 1, with SBPs in 160s-170s. No need for ICU admission at this time. Please do not hesitate to re-consult in the event of clinical change or deterioration. Discussed with attending Intensivist Dr. Carmine Savoy.

## 2021-02-04 NOTE — ED Triage Notes (Signed)
Patient arrived via ems from home- complaining of difficulty breathing. Upon EMS patients O2 sat were in the 80's- pt placed on non-rebreather and O2 sats were in the mid to upper 90's. Patient is dialysis Mon/wed/friday- reports last treatment Monday- stated she was sick on Tuesday so she did not go. Pt also with elevated BGL- reports when she does not eat she does not take her insulin. Last BGL in the 500's

## 2021-02-04 NOTE — H&P (Signed)
[facility]      Patient: [name]  Date: 02/04/2021   DOB: 11-03-1955  Admission Date: 02/04/2021   MRN: [id number]  Attending: Lisa Roca, MD         Chief Complaint   Patient presents with    Difficulty breathing    Cough   Chest pain  History Gathered From: Patient and review of ER records and previous records.    HISTORY AND PHYSICAL     [name] is a 66 y.o. female with a PMHx of pretension diabetes COPD and ESRD currently on dialysis who presented with shortness of breath cough and chest pain.  She was recently admitted in December 2022 with flash pulmonary edema in the setting of missed dialysis.  She also unfortunately missed her dialysis on Wednesday, 11 December.  And came in in the early hours of this morning with a chief complaint of shortness of breath and a cough  Claims she was too short of breath to go to dialysis that day.    Past Medical History:   Diagnosis Date    Chronic obstructive pulmonary disease     Diabetes mellitus     Hyperlipidemia     Hypertension     Sleep apnea 2018       Past Surgical History:   Procedure Laterality Date    EGD, BIOPSY N/A 05/14/2019    Procedure: EGD, BIOPSY;  Surgeon: Ronie Spies, MD;  Location: [facility];  Service: Gastroenterology;  Laterality: N/A;    EXPLORATORY LAPAROTOMY N/A 05/27/2019    Procedure: EXPLORATORY LAPAROTOMY;  Surgeon: Herbert Moors., MD;  Location: [facility];  Service: General;  Laterality: N/A;    JOINT REPLACEMENT      shoulder    JOINT REPLACEMENT      knee    OVARY SURGERY Left     Removal    REMOVAL, FOREIGN BODY N/A 05/27/2019    Procedure: REMOVAL, PERITONEAL DIALYSIS CATHETER;  Surgeon: Herbert Moors., MD;  Location: [facility];  Service: General;  Laterality: N/A;    TUNNELED CATH CHECK/CHANGE ([facility]ATH) N/A 07/03/2019    Procedure: TUNNELED CATH CHECK/CHANGE;  Surgeon: Maureen Ralphs, MD;  Location: [facility];  Service: Interventional Radiology;  Laterality: N/A;    TUNNELED CATH  PLACEMENT ([facility]MCATH) Bilateral 05/23/2019    Procedure: TUNNELED CATH PLACEMENT;  Surgeon: Jacques Earthly, MD;  [facility];  Service: Interventional Radiology;  Laterality: Bilateral;    TUNNELED CATH PLACEMENT ([facility]ATH) Right 07/31/2019    Procedure: TUNNELED CATH PLACEMENT;  Surgeon: Jacques Earthly, MD;  [facility];  Service: Interventional Radiology;  Laterality: Right;       Prior to Admission medications    Medication Sig Start Date End Date Taking? Authorizing Provider   albuterol sulfate HFA (PROVENTIL) 108 (90 Base) MCG/ACT inhaler Inhale 2 puffs into the lungs every 6 (six) hours as needed for Wheezing or Shortness of Breath    [provider]   amLODIPine (NORVASC) 10 MG tablet Take 1 tablet by mouth every 24 hours    [provider]   aspirin 81 MG chewable tablet Chew 81 mg by mouth daily    [provider]   atorvastatin (LIPITOR) 40 MG tablet Take 40 mg by mouth daily    [provider]   carvedilol (COREG) 25 MG tablet Take 1 tablet by mouth 2 (two) times daily with meals 07/22/20   [provider]  dexlansoprazole (Dexilant) 60 MG XR capsule     [provider]   gabapentin (NEURONTIN) 400 MG capsule Take 1 capsule by mouth every 8 (eight) hours    [provider]   hydrALAZINE (APRESOLINE) 50 MG tablet Take 2 tablets by mouth 2 (two) times daily    [provider]   Insulin Degludec (TRESIBA SC) Inject 35 Unit into the skin    [provider]   Insulin Regular Human (NovoLIN R FlexPen) 100 UNIT/ML Solution Pen-injector 15 Unit    [provider]   losartan (COZAAR) 100 MG tablet Take 1 tablet by mouth daily    [provider]   metoprolol tartrate (LOPRESSOR) 100 MG tablet Take 1 tablet by mouth every 12 (twelve) hours    [provider]   midodrine (PROAMATINE) 5 MG tablet Take by mouth    [provider]   omeprazole (PriLOSEC) 20 MG capsule     [provider]   oxyCODONE (ROXICODONE) 10 MG immediate release tablet Take 1 tablet by mouth    [provider]   sevelamer (RENVELA) 800 MG tablet Take 800 mg by mouth 3 (three) times daily with meals    [provider]   acetaminophen (TYLENOL) 325 MG tablet Take 2 tablets (650 mg) by mouth every 6 (six) hours as needed for Pain or Fever 01/07/21 02/04/21  Diane Landry, MD   amLODIPine (NORVASC) 10 MG tablet Take 1 tablet (10 mg total) by mouth daily 01/07/20 02/04/21  Winfred, Carolan Clines, MD   gabapentin (NEURONTIN) 600 MG tablet Take 1 tablet (600 mg) by mouth 2 (two) times daily 01/07/21 02/04/21  Diane Landry, MD   insulin aspart (NovoLOG) 100 UNIT/ML injection Inject 10 Units into the skin 3 (three) times daily before meals 01/08/21 02/04/21  Diane Landry, MD   lactobacillus/streptococcus (RISAQUAD) Cap Take 1 capsule by mouth 2 (two) times daily 01/07/21 02/04/21  Diane Landry, MD   ondansetron (ZOFRAN-ODT) 4 MG disintegrating tablet Take 1 tablet (4 mg) by mouth every 6 (six) hours as needed for Nausea 01/07/21 02/04/21  Diane Landry, MD   Tyler Aas FlexTouch 200 UNIT/ML Solution Pen-injector Inject 45 Units into the skin nightly 01/09/21 02/04/21  Diane Landry, MD       Allergies   Allergen Reactions    Lisinopril Hives    Metformin Hives       CODE STATUS:     PRIMARY CARE MD: Horton Marshall, MD    Family History   Problem Relation Age of Onset    Diabetes Mother     Hypertension Mother     Diabetes Father     Hypertension Father     Diabetes Sister     Hypertension Sister     Diabetes Brother     Hypertension Brother     Diabetes Paternal Aunt     Diabetes Paternal Uncle        Social History     Tobacco Use    Smoking status: Never    Smokeless tobacco: Never   Vaping Use    Vaping Use: Never used   Substance Use Topics    Alcohol use: Never    Drug use: Never       REVIEW OF SYSTEMS   Positive for: Shortness of breath chest pain  Negative for: Fever or chills.  All ROS  completed and otherwise negative.    PHYSICAL EXAM     Vital  Signs (most recent): BP 144/67   Pulse 85   Temp 98.5 F (36.9 C) (Oral)   Resp 18   Ht 1.575 m (5\' 2" )   Wt 93.9 kg (207 lb)   LMP  (LMP Unknown) Comment: post menopausal  SpO2 100%   BMI 37.86 kg/m   Constitutional: Obese lady who is in mild respiratory distress patient speaks freely in full sentences.   HEENT: NC/AT, PERRL, no scleral icterus or conjunctival pallor, no nasal discharge, MMM, oropharynx without erythema or exudate  Neck: trachea midline, supple, no cervical or supraclavicular lymphadenopathy or masses  Cardiovascular: RRR, normal S1 S2, no murmurs, gallops, palpable thrills, no JVD, Non-displaced PMI.  Respiratory: Normal rate. No retractions or increased work of breathing. Clear to auscultation and percussion bilaterally.  Gastrointestinal: +BS, non-distended, soft, non-tender, no rebound or guarding, no hepatosplenomegaly  Genitourinary: no suprapubic or costovertebral angle tenderness  Musculoskeletal: ROM and motor strength grossly normal. No clubbing, edema, or cyanosis. DP and radial pulses 2+ and symmetric.  Skin exam: Warm and dry  Neurologic: EOMI, CN 2-12 grossly intact. no gross motor or sensory deficits  Psychiatric: AAOx3, affect and mood appropriate. The patient is alert, interactive, appropriate.  Capillary refill: Less than 3 seconds capillary refill    Exam done by Lisa Roca, MD on 02/04/21 at 11:09 AM      LABS & IMAGING       IMAGING:  Upon my review: X-ray shows pulmonary vascular congestion.    CARDIAC:  EKG Interpretation (upon my review):      Markers:  Recent Labs   Lab 02/04/21  0645 02/04/21  0455   hs Troponin-I 109.2* 98.2*   hs Troponin-I Delta 11.0 5.9       EMERGENCY DEPARTMENT COURSE:  Orders Placed This Encounter   Procedures    COVID-19 (SARS-CoV-2) only ([facility]) asymptomatic admission    Chest AP Portable    CBC and differential    Comprehensive metabolic panel    High Sensitivity  Troponin-I    High Sensitivity Troponin-I with calculated Delta    GFR    Beta-HCG, Tumor Marker, Quantitative    High Sensitivity Troponin-I at 2 hrs with calculated Delta    High Sensitivity Troponin-I with calculated Delta    Diet Consistent Carbohydrate and Heart Healthy    Vital Signs    Notify    Hemodialysis Interventions    Catheter care (fistula and graft)    Glucose POC    Notify physician    Vital signs with SpO2    Nasal Cannula Low-Flow (5 lpm or Less)    Catheter care (fistula and graft)    Full Code    ED Unit Sec Comm Order    ED Unit Sec Comm Order    Contact isolation status    Glucose Whole Blood - POCT    ECG 12 lead    Echocardiogram Adult Complete W Color Doppler Waveform    Saline lock IV    Hemodialysis inpatient    Admit to Observation       ASSESSMENT & PLAN     SHAYONA HIBBITTS is a 66 y.o. female [facility] with Hypertensive emergency.    Patient Active Hospital Problem List:  Hypertensive emergency (02/04/2021)     Patient seen by both intensivist and cardiology.  No need to admit to ICU at the present time blood pressure seems to have improved.  Reintroduce home antihypertensive visit.  Ultrafiltration /dialysis will  help with fluid overload    Dyspnea/acute respiratory failure  In the setting of pulmonary edema due to noncompliance with dialysis leading to fluid overload  Dialysis and ultrafiltration as outlined above    Elevated troponin  Likely in the setting of CKD, hypertension and demand ischemia      ESRD on dialysis  Patient on dialysis and missed dialysis 2 days prior  I suspect this result was imparts the cause of presented to the hospital with dyspnea and pulmonary edema and hypertensive urgency  Dialysis orders.  Dr. Corky Mull and associates    Diabetes mellitus  Add sliding scale coverage to regimen  Anticipate with after add long-acting insulin in the form of Lantus to regimen.    Peripheral neuropathy  Resume home dose of  Neurontin.    Noncompliance with medical follow-up and dialysis  Importance of prompt compliance discussed.      Hyperlipidemia  Resume statins.    Sleep apnea by history  May need CPAP at bedtime.      Obesity  Diet exercise weight loss and lifestyle modification.    COPD by history  Resume home inhalers.      Anemia  Likely secondary to CKD we will continue to monitor  Use ESA based on guidelines.  Check iron profile.      GERD  Resume PPI               Nutrition  Consistent carbohydrate cardiac diet    DVT/VTE Prophylaxis  Subcu heparin    Anticipated medical stability for discharge: 1 to 2 days.    Service status/Reason for ongoing hospitalization: Dialyze manage fluid overload as well as control blood pressure  Anticipated Discharge Needs: None apparent    Signed,  Lisa Roca, MD    02/04/2021 11:09 AM  Time Elapsed:   45  minutes

## 2021-02-04 NOTE — Progress Notes (Signed)
Arrived in room, pt is alert and oriented.  Pt denied pain and sob.  LAVG access is optimally working. Report received from primary RN pre-dialysis.  Pt signed HD consent. Timeouts and safety checks done. Will closely monitor the pt.        02/04/21 1500   Bedside Nurse Communication   Name of bedside RN - pre dialysis Edwena Felty, RN   Treatment Initiation- With Dialysis Precautions   Time Out/Safety Check Completed Yes   Consent for HD signed for this hospitalization (Date) 02/04/21   Consent for HD signed for this hospitalization (Time) 1450   Blood Consent Verified N/A   Dialysis Precautions All Connections Secured;Saline Line Double Clamped;Venous Parameters Set;Arterial Parameters Set;Air Foam Detecctor Engaged   Dialysis Treatment Type Bedside;Routine   Is patient diabetic? Yes   RO/Hemodialysis Architectural technologist   Is Total Chlorine less than 0.1 ppm? Yes   Orignial Total Chlorine Testing Time 1455   At 4 Hour Total Chlorine Testing Time Barrister's clerk Number 1    Machine Serial Number [id number]   RO # 24   RO Serial # [id number]   pH 7.2   Pressure Test Verified Yes   Alarms Verified Passed   Machine Temperature 98.6 F (37 C)   Alarms Verified Yes   Na+ mEq (Machine) 138 mEq   Bicarb mEq (Machine) 35 mEq   Hemodialysis Conductivity (Machine) 13.8   Hemodialysis Conductivity (Meter) 13.8   Dialyzer Lot Number [id number]   Tubing Lot Number [id number]   RO Machine Log Completed Yes   Hepatitis Status   HBsAg (Antigen) Result Unknown   HBsAg Date Drawn 02/04/21   Dialysis Weight   Pre-Treatment Weight (Kg) 109.8   Scale Type ICU Bed Scale   Vitals   Temp 97.5 F (36.4 C)   Heart Rate 84   Resp Rate 20   BP 158/81   SpO2 100 %   O2 Device Nasal cannula   Assessment   Mental Status Alert;Oriented   Respiratory Pattern Regular   R Breath Sounds Diminished   L Breath Sounds Diminished   Edema  WDL   General Skin Color Appropriate for ethnicity   Skin Condition/Temp  Warm;Dry   Abdomen Inspection Rounded   GI Symptoms None   Mobility Bed   Pain Assessment   Charting Type Assessment   Pain Scale Used Numeric Scale (0-10)   Numeric Pain Scale   Pain Score 0   POSS Score 1   Hemodialysis Comments   Pre-Hemodialysis Comments Timeout and safety checks done

## 2021-02-04 NOTE — Plan of Care (Signed)
Morning Shift  Unit: 28  Room#: 2802    Neuro: Alert, AOX4    Pulm.: Bilateral Lungs diminished    CV: NSR w/ multiple pvc/arrythmias    GI: Round, non-tender    GU: External cath. Output: 238ml    Integum: Skin intact    Rincon.: L/R UE : ACTIVE FULL L/R LE: ACTIVE LIMITED    Pt. BP in the 220. C/O of congestion. Pt. Received hemodialysis Pt. SBP controlled at 150. Pt. BG 400 at 1500 today. Physician made aware.    Problem: Renal Instability  Goal: Fluid and electrolyte balance are achieved/maintained  Outcome: Progressing  Flowsheets (Taken 02/04/2021 1837)  Fluid and electrolyte balance are achieved/maintained:   Monitor daily weight   Observe for cardiac arrhythmias   Assess and reassess fluid and electrolyte status     Problem: Patient Receiving Advanced Renal Therapies  Goal: Therapy access site remains intact  Outcome: Progressing     Problem: Renal Instability  Goal: Fluid and electrolyte balance are achieved/maintained  Outcome: Progressing  Flowsheets (Taken 02/04/2021 1837)  Fluid and electrolyte balance are achieved/maintained:   Monitor daily weight   Observe for cardiac arrhythmias   Assess and reassess fluid and electrolyte status     Problem: Patient Receiving Advanced Renal Therapies  Goal: Therapy access site remains intact  Outcome: Progressing

## 2021-02-04 NOTE — Consults (Signed)
[facility]  CONSULT  Aaron Edelman, x [phone] ([place])      Date Time: 02/04/21 7:29 PM  Patient Name: [name]  Requesting Physician: Lisa Roca, MD  Consulting Physician: Verl Blalock, MD, MD    Primary Care Physician: Horton Marshall, MD    Reason for Consultation: ESRD, provision of hemodialysis    LATE NOTE - PATIENT SEEN DURING HD THIS EVENING       Assessment:     Patient Active Problem List   Diagnosis    Iron deficiency anemia secondary to inadequate dietary iron intake    Sideropenic dysphagia    Peritoneal dialysis catheter dysfunction, initial encounter    Peritonitis    Upper GI bleed    ESRD (end stage renal disease) on dialysis    Generalized abdominal pain    Thrombocytopenia    h/o orthostatic hypotension    Type 2 diabetes mellitus, with long-term current use of insulin    GERD (gastroesophageal reflux disease)    Bowel incontinence    Right sided numbness    Uncontrolled type 2 diabetes mellitus with hyperglycemia    Hypertension    Hyperlipidemia    History of COPD    Gastritis with hemorrhage    History of anemia due to chronic kidney disease    Open wound, abdominal wall, lateral, subsequent encounter    Gout    Dizziness    Fall    History of CVA (cerebrovascular accident)    Pneumonia due to COVID-19 virus    Diarrhea    Hypertensive urgency    Shortness of breath    ESRD (end stage renal disease)    Hyperkalemia    Pulmonary edema    Hypervolemia    Elevated troponin    Chest pain    Nausea vomiting and diarrhea    Breast pain, left    Syncope    Hypertensive emergency       ESRD on HD MWF at [facility] - followed by Dr. Randel Pigg  - missed HD on 1/11 due to diarrhea/not feeling well   Accelerated hypertension due to volume overload in the setting of missed HD  Volume overload  Anemia in CKD   Hyperphosphatemia - controlled  Secondary hyperparathyroidism    Recommendations:     Dialyzed today per routine schedule, 3.5L net UF  UF treatment tomorrow  aiming for additional 3.5-4L removal   Anticipate no needs for HD on Sunday; plan for routine HD on Monday   Continue sevelamer  Check PTH  Resume OP antihypertensive medications  Salt and fluid restriction         Chinery, Lawerance Sabal, MD, thank you for this consultation.  We will follow the patient with you during this hospitalization.  Please contact me with any questions or issues.    Signed by: Verl Blalock, MD, MD  [facility]  [phone] (office)  x [phone] ([place])      -----------------------------------------------------------------------------------------------------------        History of Presenting Illness:   [name] is a 66 y.o. female with ESRD on HD at [facility], HTN, DM type 2 who presented to the hospital with 2 day history of progressive shortness of breath in the setting of missing HD on 1/11 due to diarrhea/overall not feeling well. Nephrology consulted for provision of hemodialysis.    Seen during [facility] this evening. Feeling better, coughing, but reported breathing felt easier towards the end of hemodialysis  session. Able to remove 3.5L, however, remains volume overloaded on exam for which we have planned to do an additional ultrafiltration session tomorrow.      Past Medical History:     Past Medical History:   Diagnosis Date    Chronic obstructive pulmonary disease     Diabetes mellitus     Hyperlipidemia     Hypertension     Sleep apnea 2018       Past Surgical History:     Past Surgical History:   Procedure Laterality Date    EGD, BIOPSY N/A 05/14/2019    Procedure: EGD, BIOPSY;  Surgeon: Ronie Spies, MD;  Location: [facility];  Service: Gastroenterology;  Laterality: N/A;    EXPLORATORY LAPAROTOMY N/A 05/27/2019    Procedure: EXPLORATORY LAPAROTOMY;  Surgeon: Herbert Moors., MD;  [facility];  Service: General;  Laterality: N/A;    JOINT REPLACEMENT      shoulder    JOINT REPLACEMENT      knee    OVARY SURGERY Left      Removal    REMOVAL, FOREIGN BODY N/A 05/27/2019    Procedure: REMOVAL, PERITONEAL DIALYSIS CATHETER;  Surgeon: Herbert Moors., MD;  [facility];  Service: General;  Laterality: N/A;    TUNNELED CATH CHECK/CHANGE ([facility]MCATH) N/A 07/03/2019    Procedure: TUNNELED CATH CHECK/CHANGE;  Surgeon: Maureen Ralphs, MD;  [facility];  Service: Interventional Radiology;  Laterality: N/A;    TUNNELED CATH PLACEMENT ([facility]ATH) Bilateral 05/23/2019    Procedure: TUNNELED CATH PLACEMENT;  Surgeon: Jacques Earthly, MD;  [facility];  Service: Interventional Radiology;  Laterality: Bilateral;    TUNNELED CATH PLACEMENT ([facility]ATH) Right 07/31/2019    Procedure: TUNNELED CATH PLACEMENT;  Surgeon: Jacques Earthly, MD;  [facility];  Service: Interventional Radiology;  Laterality: Right;       Family History:     Family History   Problem Relation Age of Onset    Diabetes Mother     Hypertension Mother     Diabetes Father     Hypertension Father     Diabetes Sister     Hypertension Sister     Diabetes Brother     Hypertension Brother     Diabetes Paternal Aunt     Diabetes Paternal Uncle        Social History:     Social History     Socioeconomic History    Marital status: Legally Separated     Spouse name: Not on file    Number of children: Not on file    Years of education: Not on file    Highest education level: Not on file   Occupational History    Not on file   Tobacco Use    Smoking status: Never    Smokeless tobacco: Never   Vaping Use    Vaping Use: Never used   Substance and Sexual Activity    Alcohol use: Never    Drug use: Never    Sexual activity: Not Currently   Other Topics Concern    Not on file   Social History Narrative    Not on file     Social Determinants of Health     Financial Resource Strain: Not on file   Food Insecurity: Not on file   Transportation Needs: Not on file   Physical Activity: Not on file   Stress: Not on file   Social Connections: Not on  file   Intimate  Partner Violence: Not on file   Housing Stability: Not on file       Allergies:     Allergies   Allergen Reactions    Lisinopril Hives    Metformin Hives       Medications:     Medications Prior to Admission   Medication Sig    amLODIPine (NORVASC) 10 MG tablet Take 1 tablet by mouth daily    aspirin 81 MG chewable tablet Chew 81 mg by mouth daily    atorvastatin (LIPITOR) 40 MG tablet Take 40 mg by mouth daily    carvedilol (COREG) 25 MG tablet Take 1 tablet by mouth 2 (two) times daily with meals    gabapentin (NEURONTIN) 600 MG tablet Take 1 capsule by mouth every 8 (eight) hours    insulin aspart (NovoLOG) 100 UNIT/ML injection Inject 15 Units into the skin 3 (three) times daily before meals    Insulin Degludec (TRESIBA SC) Inject 35 Unit into the skin    LACTOBACILLUS PO Take 1 capsule by mouth    pantoprazole (PROTONIX) 40 MG tablet Take 40 mg by mouth daily    sevelamer (RENVELA) 800 MG tablet Take 800 mg by mouth 2 (two) times daily with meals    acetaminophen (TYLENOL) 500 MG tablet Take 500 mg by mouth every 6 (six) hours as needed for Pain    albuterol sulfate HFA (PROVENTIL) 108 (90 Base) MCG/ACT inhaler Inhale 2 puffs into the lungs every 6 (six) hours as needed for Wheezing or Shortness of Breath        Scheduled Meds: PRN Meds:    amLODIPine, 10 mg, Oral, Daily  [START ON 02/05/2021] aspirin, 81 mg, Oral, Daily  atorvastatin, 40 mg, Oral, QHS  gabapentin, 400 mg, Oral, Q8H  insulin glargine, 15 Units, Subcutaneous, QHS  insulin lispro, 1-6 Units, Subcutaneous, QHS  insulin lispro, 2-10 Units, Subcutaneous, TID AC  insulin lispro, 5 Units, Subcutaneous, TID AC  pantoprazole, 40 mg, Oral, QAM AC  sevelamer, 800 mg, Oral, BID Meals          Continuous Infusions:   albuterol, 5 mg, Q6H PRN  dextrose, 15 g of glucose, PRN   And  dextrose, 12.5 g, PRN   And  dextrose, 12.5 g, PRN   And  glucagon (rDNA), 1 mg, PRN  hydrALAZINE, 10 mg, Q6H PRN  oxyCODONE, 5 mg, Q6H PRN  sodium chloride, 100 mL, Q1H PRN  sodium  chloride, 250 mL, PRN            Review of Systems:   A comprehensive review of systems was per the HPI and below:     General ROS: no f/c  HEENT ROS: no blurry vision\  Respiratory ROS: shortness of breath, cough   Cardiovascular ROS: negative for chest pain or dyspnea on exertion  Gastrointestinal ROS: diarrhea  Musculoskeletal ROS:  negative for trauma   Neurological ROS: no focal weakness\s      Physical Exam:     Vitals:    02/04/21 1825 02/04/21 1830 02/04/21 1845 02/04/21 1906   BP: (!) 174/94 150/72 156/79 164/69   Pulse: 86 92 94 85   Resp: 22 (!) 31 22 20    Temp:    98.1 F (36.7 C)   TempSrc:       SpO2: 100% 100% 100% 100%   Weight:       Height:           Intake and Output Summary (  Last 24 hours) at Date Time    Intake/Output Summary (Last 24 hours) at 02/04/2021 1929  Last data filed at 02/04/2021 1906  Gross per 24 hour   Intake --   Output 3900 ml   Net -3900 ml       Recent weights:  Weight Monitoring 01/09/2021 01/10/2021 01/11/2021 01/12/2021 02/04/2021 02/04/2021 02/04/2021   Height - - - - 157.5 cm 158.5 cm 158.5 cm   Height Method - - - - - - -   Weight 94.7 kg 94.8 kg 94.8 kg 93.8 kg 93.895 kg 108.1 kg 108.1 kg   Weight Method Bed Scale Bed Scale Bed Scale Bed Scale - - -   BMI (calculated) - - - - 37.9 kg/m2 43.1 kg/m2 43.1 kg/m2       General: awake, alert, oriented x 3, no acute distress.  HEENT: sclera anicteric  Neck: supple  Cardiovascular: regular rate and rhythm  Lungs: crackles noted anteriorly, no wheezing, bilateral air entry,  normal work of breathing   Abdomen: soft  Extremities: ++edema, L AVG  Neuro: A+O x 3      Labs:     Recent Labs   Lab 02/04/21  0227   WBC 5.11   Hgb 10.5*   Hematocrit 34.4*   Platelets 102*     Recent Labs   Lab 02/04/21  0227   Sodium 136   Potassium 5.4*   Chloride 98*   CO2 23   BUN 40.0*   Creatinine 6.9*   Calcium 8.1*   Albumin 2.7*   Glucose 484*   EGFR 7.2       cc: Lisa Roca, MD  Horton Marshall, MD

## 2021-02-05 ENCOUNTER — Observation Stay: Payer: 59

## 2021-02-05 DIAGNOSIS — I161 Hypertensive emergency: Secondary | ICD-10-CM

## 2021-02-05 DIAGNOSIS — N186 End stage renal disease: Secondary | ICD-10-CM

## 2021-02-05 DIAGNOSIS — J810 Acute pulmonary edema: Secondary | ICD-10-CM

## 2021-02-05 DIAGNOSIS — R778 Other specified abnormalities of plasma proteins: Secondary | ICD-10-CM

## 2021-02-05 DIAGNOSIS — R0602 Shortness of breath: Secondary | ICD-10-CM

## 2021-02-05 LAB — TSH: TSH: 0.97 u[IU]/mL (ref 0.35–4.94)

## 2021-02-05 LAB — GLUCOSE WHOLE BLOOD - POCT
Whole Blood Glucose POCT: 270 mg/dL — ABNORMAL HIGH (ref 70–100)
Whole Blood Glucose POCT: 246 mg/dL — ABNORMAL HIGH (ref 70–100)
Whole Blood Glucose POCT: 214 mg/dL — ABNORMAL HIGH (ref 70–100)
Whole Blood Glucose POCT: 369 mg/dL — ABNORMAL HIGH (ref 70–100)

## 2021-02-05 LAB — CBC AND DIFFERENTIAL
Absolute NRBC: 0 10*3/uL (ref 0.00–0.00)
Basophils Absolute Automated: 0.01 10*3/uL (ref 0.00–0.08)
Basophils Automated: 0.2 %
Eosinophils Absolute Automated: 0.11 10*3/uL (ref 0.00–0.44)
Eosinophils Automated: 2.4 %
Hematocrit: 32.6 % — ABNORMAL LOW (ref 34.7–43.7)
Hgb: 9.9 g/dL — ABNORMAL LOW (ref 11.4–14.8)
Immature Granulocytes Absolute: 0.01 10*3/uL (ref 0.00–0.07)
Immature Granulocytes: 0.2 %
Instrument Absolute Neutrophil Count: 3.01 10*3/uL (ref 1.10–6.33)
Lymphocytes Absolute Automated: 0.77 10*3/uL (ref 0.42–3.22)
Lymphocytes Automated: 16.8 %
MCH: 29.4 pg (ref 25.1–33.5)
MCHC: 30.4 g/dL — ABNORMAL LOW (ref 31.5–35.8)
MCV: 96.7 fL — ABNORMAL HIGH (ref 78.0–96.0)
MPV: 12.3 fL (ref 8.9–12.5)
Monocytes Absolute Automated: 0.67 10*3/uL (ref 0.21–0.85)
Monocytes: 14.6 %
Neutrophils Absolute: 3.01 10*3/uL (ref 1.10–6.33)
Neutrophils: 65.8 %
Nucleated RBC: 0 /100 WBC (ref 0.0–0.0)
Platelets: 90 10*3/uL — ABNORMAL LOW (ref 142–346)
RBC: 3.37 10*6/uL — ABNORMAL LOW (ref 3.90–5.10)
RDW: 14 % (ref 11–15)
WBC: 4.58 10*3/uL (ref 3.10–9.50)

## 2021-02-05 LAB — ECHOCARDIOGRAM ADULT COMPLETE W CLR/ DOPP WAVEFORM
AV Area (Cont Eq VTI): 1.87
AV Area (Cont Eq VTI): 1.953
AV Mean Gradient: 4
AV Mean Gradient: 4
AV Peak Velocity: 141
AV Peak Velocity: 142
BP Mod LV Ejection Fraction: 55.456
IVS Diastolic Thickness (2D): 1.27
LA Dimension (2D): 4.4
LA Volume Index (BP A-L): 0.032
LVID diastole (2D): 4.19
LVID systole (2D): 3.04
MV Area (PHT): 4.31
MV E/A: 1.66
MV E/A: 1.7
MV E/e' (Average): 23.144
Prox Ascending Aorta Diameter: 3.3
RV Basal Diastolic Dimension: 4.04
RV Systolic Pressure: 35.976
TAPSE: 1.43

## 2021-02-05 LAB — FOLATE: Folate: 6.2 ng/mL

## 2021-02-05 LAB — COMPREHENSIVE METABOLIC PANEL
ALT: 16 U/L (ref 0–55)
AST (SGOT): 12 U/L (ref 5–41)
Albumin/Globulin Ratio: 0.7 — ABNORMAL LOW (ref 0.9–2.2)
Albumin: 2.3 g/dL — ABNORMAL LOW (ref 3.5–5.0)
Alkaline Phosphatase: 155 U/L — ABNORMAL HIGH (ref 37–117)
Anion Gap: 9 (ref 5.0–15.0)
BUN: 28 mg/dL — ABNORMAL HIGH (ref 7.0–21.0)
Bilirubin, Total: 0.4 mg/dL (ref 0.2–1.2)
CO2: 28 mEq/L (ref 17–29)
Calcium: 7.9 mg/dL — ABNORMAL LOW (ref 8.5–10.5)
Chloride: 101 mEq/L (ref 99–111)
Creatinine: 4.9 mg/dL — ABNORMAL HIGH (ref 0.4–1.0)
Globulin: 3.1 g/dL (ref 2.0–3.6)
Glucose: 211 mg/dL — ABNORMAL HIGH (ref 70–100)
Potassium: 4.2 mEq/L (ref 3.5–5.3)
Protein, Total: 5.4 g/dL — ABNORMAL LOW (ref 6.0–8.3)
Sodium: 138 mEq/L (ref 135–145)

## 2021-02-05 LAB — HEMOLYSIS INDEX: Hemolysis Index: 5 Index (ref 0–24)

## 2021-02-05 LAB — IRON PROFILE
Iron Saturation: 18 % (ref 15–50)
Iron: 31 ug/dL — ABNORMAL LOW (ref 40–145)
TIBC: 171 ug/dL — ABNORMAL LOW (ref 265–497)
UIBC: 140 ug/dL (ref 126–382)

## 2021-02-05 LAB — FERRITIN: Ferritin: 613.5 ng/mL — ABNORMAL HIGH (ref 4.60–204.00)

## 2021-02-05 LAB — VITAMIN B12: Vitamin B-12: 937 pg/mL — ABNORMAL HIGH (ref 211–911)

## 2021-02-05 LAB — GFR: EGFR: 10.7

## 2021-02-05 MED ORDER — SODIUM CHLORIDE 0.9 % IV BOLUS
100.0000 mL | INTRAVENOUS | Status: AC | PRN
Start: 2021-02-05 — End: 2021-02-06

## 2021-02-05 MED ORDER — SODIUM CHLORIDE 0.9 % IV BOLUS
250.0000 mL | INTRAVENOUS | Status: AC | PRN
Start: 2021-02-05 — End: 2021-02-05

## 2021-02-05 MED ORDER — SODIUM CHLORIDE 0.9 % IV MBP
1.0000 g | INTRAVENOUS | Status: DC
Start: 2021-02-05 — End: 2021-02-05

## 2021-02-05 MED ORDER — HYDRALAZINE HCL 25 MG PO TABS
25.0000 mg | ORAL_TABLET | Freq: Three times a day (TID) | ORAL | Status: DC
Start: 2021-02-05 — End: 2021-02-08
  Administered 2021-02-05 – 2021-02-08 (×10): 25 mg via ORAL
  Filled 2021-02-05 (×10): qty 1

## 2021-02-05 MED ORDER — ALBUTEROL-IPRATROPIUM 2.5-0.5 (3) MG/3ML IN SOLN
3.0000 mL | Freq: Four times a day (QID) | RESPIRATORY_TRACT | Status: DC
Start: 2021-02-05 — End: 2021-02-07
  Administered 2021-02-05 – 2021-02-06 (×4): 3 mL via RESPIRATORY_TRACT
  Filled 2021-02-05 (×3): qty 3

## 2021-02-05 MED ORDER — SODIUM CHLORIDE 0.9 % IV MBP
2.0000 g | INTRAVENOUS | Status: DC
Start: 2021-02-05 — End: 2021-02-08
  Administered 2021-02-05 – 2021-02-07 (×3): 2 g via INTRAVENOUS
  Filled 2021-02-05 (×4): qty 2000

## 2021-02-05 MED ORDER — SODIUM CHLORIDE 0.9 % IV BOLUS
100.0000 mL | INTRAVENOUS | Status: AC | PRN
Start: 2021-02-05 — End: 2021-02-05

## 2021-02-05 MED ORDER — SODIUM CHLORIDE 0.9 % IV BOLUS
250.0000 mL | INTRAVENOUS | Status: AC | PRN
Start: 2021-02-05 — End: 2021-02-06

## 2021-02-05 NOTE — Progress Notes (Signed)
[name]       Date Time: 02/05/21 3:00 PM  Patient Name: [name], [name]  MRN: [id number]  CSN:  [id number]  Date of Admission:  02/04/2021           Assessment:   Admitted with hypertensive emergency in the setting of missed dialysis. Minimal troponin elevation that I would not treat as acute coronary syndrome. Blood pressure improved, but still on the higher side. Drowsy this morning.  Echo 02/05/2021:  * Borderline right ventricular function.    * Normal left ventricular ejection fraction.    * No wall motion abnormalities seen.    * Mild-Moderate tricuspid regurgitation.    * By Simpson's, LV EF is 55%.    Presumed CAD.      Recommendations:     Add hydralazine 25mg  PO TID (ordered).  Continue amlodipine.  Continue aspirin, statin.      Subjective:   Drowsy, but wakes to voice.    Medications:      Scheduled Meds: PRN Meds:    amLODIPine, 10 mg, Oral, Daily  aspirin, 81 mg, Oral, Daily  atorvastatin, 40 mg, Oral, QHS  dextromethorphan polistirex ER, 30 mg, Oral, Q12H SCH  gabapentin, 400 mg, Oral, Q8H  insulin glargine, 15 Units, Subcutaneous, QHS  insulin lispro, 1-6 Units, Subcutaneous, QHS  insulin lispro, 2-10 Units, Subcutaneous, TID AC  insulin lispro, 5 Units, Subcutaneous, TID AC  pantoprazole, 40 mg, Oral, QAM AC  sevelamer, 800 mg, Oral, BID Meals        Continuous Infusions:   albuterol, 5 mg, Q6H PRN  dextrose, 15 g of glucose, PRN   And  dextrose, 12.5 g, PRN   And  dextrose, 12.5 g, PRN   And  glucagon (rDNA), 1 mg, PRN  hydrALAZINE, 10 mg, Q6H PRN  oxyCODONE, 5 mg, Q6H PRN          Home Meds:   Medications Prior to Admission   Medication Sig Dispense Refill Last Dose    amLODIPine (NORVASC) 10 MG tablet Take 1 tablet by mouth daily   02/03/2021    aspirin 81 MG chewable tablet Chew 81 mg by mouth daily   02/03/2021    atorvastatin (LIPITOR) 40 MG tablet Take 40 mg by mouth daily   02/03/2021    carvedilol (COREG) 25 MG tablet Take 1 tablet by mouth 2  (two) times daily with meals   02/03/2021    gabapentin (NEURONTIN) 600 MG tablet Take 1 capsule by mouth every 8 (eight) hours   02/03/2021    insulin aspart (NovoLOG) 100 UNIT/ML injection Inject 15 Units into the skin 3 (three) times daily before meals   02/03/2021    Insulin Degludec (TRESIBA SC) Inject 35 Unit into the skin   02/03/2021    LACTOBACILLUS PO Take 1 capsule by mouth   02/03/2021    pantoprazole (PROTONIX) 40 MG tablet Take 40 mg by mouth daily   02/03/2021    sevelamer (RENVELA) 800 MG tablet Take 800 mg by mouth 2 (two) times daily with meals   02/03/2021    acetaminophen (TYLENOL) 500 MG tablet Take 500 mg by mouth every 6 (six) hours as needed for Pain       albuterol sulfate HFA (PROVENTIL) 108 (90 Base) MCG/ACT inhaler Inhale 2 puffs into the lungs every 6 (six) hours as needed for Wheezing or Shortness of Breath             Physical Exam:  BP 150/69   Pulse 81   Temp 98.7 F (37.1 C)   Resp 20   Ht 1.585 m (5' 2.4")   Wt 107.8 kg (237 lb 10.5 oz)   LMP  (LMP Unknown) Comment: post menopausal  SpO2 97%   BMI 42.91 kg/m  O2 Flow Rate (L/min): 2 L/min O2 Device: Nasal cannula    Temp (24hrs), Avg:98.4 F (36.9 C), Min:97.5 F (36.4 C), Max:99.2 F (37.3 C)      Telemetry reviewed      Intake and Output Summary (Last 24 hours) at Date Time    Intake/Output Summary (Last 24 hours) at 02/05/2021 1500  Last data filed at 02/05/2021 0308  Gross per 24 hour   Intake --   Output 3850 ml   Net -3850 ml       General: Drowsy.  Head: normocephalic  Eyes: Lids & conjunctiva normal  Cardiovascular: regular rate and rhythm, soft S1, S2, no S3, no S4, no murmurs, rubs or gallops  Neck: JVD difficult to assess (habitus)  Lungs: Bilateral crackles.  Abdomen: soft, non-tender.  Extremities: no edema  Pulse: 2+ radial pulses.  Neurological: No gross motor defect  Musculoskeletal: normal strength and tone    Labs:     Recent Labs   Lab 02/04/21  0645 02/04/21  0455   hs Troponin-I 109.2* 98.2*   hs  Troponin-I Delta 11.0 5.9     Recent Labs   Lab 02/05/21  0321   Bilirubin, Total 0.4   Protein, Total 5.4*   Albumin 2.3*   ALT 16   AST (SGOT) 12             Recent Labs   Lab 02/05/21  0321 02/04/21  0227   WBC 4.58 5.11   Hgb 9.9* 10.5*   Hematocrit 32.6* 34.4*   Platelets 90* 102*     Recent Labs   Lab 02/05/21  0321 02/04/21  0227   Sodium 138 136   Potassium 4.2 5.4*   Chloride 101 98*   CO2 28 23   BUN 28.0* 40.0*   Creatinine 4.9* 6.9*   EGFR 10.7 7.2   Glucose 211* 484*   Calcium 7.9* 8.1*       Lab Results   Component Value Date    BNP 2,878 (H) 10/20/2020       Weight Monitoring 01/10/2021 01/11/2021 01/12/2021 02/04/2021 02/04/2021 02/04/2021 02/05/2021   Height - - - 157.5 cm 158.5 cm 158.5 cm -   Height Method - - - - - - -   Weight 94.8 kg 94.8 kg 93.8 kg 93.895 kg 108.1 kg 108.1 kg 107.8 kg   Weight Method Bed Scale Bed Scale Bed Scale - - - -   BMI (calculated) - - - 37.9 kg/m2 43.1 kg/m2 43.1 kg/m2 -         Imaging:   Radiological Procedure        Signed by: Jamelle Rushing, MD    [place] [phone] (8am-5pm)  NP [facility] [phone] (8am-2pm)   After hours, non urgent consult line [phone]  After Hours, urgent consults [phone]  Office/Pager: [phone]

## 2021-02-05 NOTE — Progress Notes (Signed)
Completed 3 hours of UF. 3.6 L UF as tolerated. Report given to [facility] RN.     02/05/21 1525   Treatment Summary   Time Off Machine 1515   Duration of Treatment (Hours) 3   Treatment Type 1:1   Dialyzer Clearance Moderately streaked   Fluid Volume Off (mL) 4000   Prime Volume (mL) 200   Rinseback Volume (mL) 200   Fluid Given: Normal Saline (mL) 0   Fluid Given: PRBC  0 mL   Fluid Given: Albumin (mL) 0   Fluid Given: Other (mL) 0   Total Fluid Given 400   Hemodialysis Net Fluid Removed 3600   Post Treatment Assessment   Post-Treatment Weight (Kg) 104.2   Patient Response to Treatment tolerated well   Graft/Fistula Left   No placement date or time found.   Access Type: Arteriovenous Fistula  Orientation: Left  Graft/Fistula Location: Upper Arm   Fistula/ Graft Assessment Abnormalities WDL   Needle Size 15 Gauge   Cannulation Sites held Arterial (min) 10   Cannulation Sites held Venous (min) 10   Hemostasis Achieved Yes   Vitals   Temp 97.6 F (36.4 C)   Heart Rate 83   Resp Rate 20   BP 142/65   SpO2 95 %   O2 Device Nasal cannula   Assessment   Mental Status Alert;Oriented;Cooperative   Cardiac (WDL) WDL   Cardiac Regularity Regular   Cardiac Symptoms None   Cardiac Rhythm Normal Sinus Rhythm   Respiratory  WDL   Respiratory Pattern Regular   Bilateral Breath Sounds Clear;Diminished   Edema  WDL   Generalized Edema Non Pitting Edema   Facial Non Pitting Edema   Sacral UTA   RLE Edema Non Pitting Edema   LLE Edema Non Pitting Edema   General Skin Color Appropriate for ethnicity   Skin Condition/Temp Warm;Dry   Gastrointestinal (WDL) WDL   Abdomen Inspection Soft;Nondistended   GI Symptoms None   Mobility Ambulatory   Pain Assessment   Charting Type Assessment   Pain Scale Used Numeric Scale (0-10)   Numeric Pain Scale   Pain Score 0   POSS Score 1   Education   Person taught Patient   Knowledge basis Substantial   Topics taught Access care;Procedure;K+   Teaching Tools Explain   [name] Understanding    Bedside Nurse Communication   Name of bedside RN - post dialysis Edwena Felty

## 2021-02-05 NOTE — Plan of Care (Signed)
Nursing Shift Report     Patient: JOELENE BARRIERE MRN: [id number]  Day: 0   Problem:     Significant Event: Vss. Afebrile. Denies pain. HD completed today. Safety protocols followed. Purposeful rounding completed.   Neuro: A/ox4. Appropriate mood and affect.  Cardiac: NSR on telemetry. Denies chest pain.  Respiratory: 2L NC. Diminished LS. Even and unlabored respirations.  GI: Active bowel sounds present. Tolerates renal diet. No N/V/D. Nontender abd. No BM this shift.  GU: Yellow/straw output without odor.   Skin: Clean and intact.    Care Plan         Problem: Safety  Goal: Patient will be free from injury during hospitalization  Outcome: Progressing  Goal: Patient will be free from infection during hospitalization  Outcome: Progressing     Problem: Pain  Goal: Pain at adequate level as identified by patient  Outcome: Progressing     Problem: Side Effects from Pain Analgesia  Goal: Patient will experience minimal side effects of analgesic therapy  Outcome: Progressing     Problem: Discharge Barriers  Goal: Patient will be discharged home or other facility with appropriate resources  Outcome: Progressing     Problem: Renal Instability  Goal: Fluid and electrolyte balance are achieved/maintained  Outcome: Progressing  Goal: Free from infection  Outcome: Progressing     Problem: Compromised Tissue integrity  Goal: Damaged tissue is healing and protected  Outcome: Progressing  Goal: Nutritional status is improving  Outcome: Progressing

## 2021-02-05 NOTE — Progress Notes (Signed)
[facility]  PROGRESS NOTE      Patient: [name]  Date: 02/05/2021   LOS: 0 Days  Admission Date: 02/04/2021   MRN: [id number]  Attending: Lisa Roca, MD  Please contact me on SecureChat       ASSESSMENT/PLAN     [name] is a 66 y.o. female admitted with Hypertensive emergency    Interval Summary:     Patient Active Hospital Problem List:  Hypertensive emergency (02/04/2021)     Likely secondary to fluid overload and missing dialysis and questionable compliance with medications  Blood pressure is improved with dialysis ultrafiltration and resumption of antihypertensive medications    End-stage renal disease on dialysis  Continue dialysis per nephrology    Flash pulmonary edema/fluid overload  Secondary to hypertensive urgency and missing dialysis  Clinically improved but still dyspneic    Acute respiratory failure with hypoxia  Secondary to fluid overload  Improving but still symptomatic    Acute bronchitis  Patient has developed a cough productive of brownish sputum  Add empiric antibiotics to regimen  Aggressive pulmonary toileting.    Diabetes mellitus with hyperglycemia  Continue sliding scale coverage and Lantus  Will adjust  Doses based on blood sugar readings.    COPD by history  Inhalers      Hyperlipidemia  Continue statins    Obesity  Exercise weight loss and lifestyle modification      Anemia  Due to CKD  ESA per protocol  Monitor CBCs and transfuse as needed    Hyperkalemia  Likely secondary to missing dialysis this is improved with dialysis    Thrombocytopenia  Chronic  Continue to monitor.    Peripheral neuropathy  Continue Neurontin.      GERD  Continue PPI           Discussed plan with patient and bedside RN    Nutrition: Consistent carbohydrate cardiac diet    DVT Prophylaxis: SCDs        Patient has BMI=Body mass index is 39.72 kg/m.  Diagnosis: Obesity based on BMI criteria  Diet exercise weight loss and lifestyle modification    Code Status: Full code    Dispo: Home  when medically stable    Family Contact: Patient's son       SUBJECTIVE     [name] states he feels better but has not developed a cough productive of brownish sputum, still has dyspnea and oxygen dependent    MEDICATIONS     Current Facility-Administered Medications   Medication Dose Route Frequency    amLODIPine  10 mg Oral Daily    aspirin  81 mg Oral Daily    atorvastatin  40 mg Oral QHS    dextromethorphan polistirex ER  30 mg Oral Q12H SCH    gabapentin  400 mg Oral Q8H    hydrALAZINE  25 mg Oral Q8H Niantic    insulin glargine  15 Units Subcutaneous QHS    insulin lispro  1-6 Units Subcutaneous QHS    insulin lispro  2-10 Units Subcutaneous TID AC    insulin lispro  5 Units Subcutaneous TID AC    pantoprazole  40 mg Oral QAM AC    sevelamer  800 mg Oral BID Meals       PHYSICAL EXAM     Vitals:    02/05/21 1638   BP: 149/85   Pulse:    Resp:    Temp:    SpO2:  Temperature: Temp  Min: 97.5 F (36.4 C)  Max: 99.2 F (37.3 C)  Pulse: Pulse  Min: 70  Max: 94  Respiratory: Resp  Min: 16  Max: 47  Non-Invasive BP: BP  Min: 129/92  Max: 183/84  Pulse Oximetry SpO2  Min: 94 %  Max: 100 %    Intake and Output Summary (Last 24 hours) at Date Time    Intake/Output Summary (Last 24 hours) at 02/05/2021 1728  Last data filed at 02/05/2021 1525  Gross per 24 hour   Intake --   Output 7200 ml   Net -7200 ml         GEN APPEARANCE: Obese female in mild respiratory distress A&OX3  HEENT: PERLA; EOMI; Conjunctiva Clear  NECK: Supple; No bruits  CVS: RRR, S1, S2; No M/G/R  LUNGS: Bilateral scattered rhonchi and wheezes  ABD: Soft; No TTP; + Normoactive BS  EXT: No edema; Pulses 2+ and intact  Skin exam: Warm and dry  NEURO: CN 2-12 intact; No Focal neurological deficits  CAP REFILL: Less than 3 seconds capillary refill  MENTAL STATUS: Awake alert and oriented x3    Exam done by Lisa Roca, MD on 02/05/21 at 5:28 PM      LABS     Recent Labs   Lab 02/05/21  0321 02/04/21  0227   WBC 4.58 5.11   RBC 3.37* 3.55*    Hgb 9.9* 10.5*   Hematocrit 32.6* 34.4*   MCV 96.7* 96.9*   Platelets 90* 102*       Recent Labs   Lab 02/05/21  0321 02/04/21  0227   Sodium 138 136   Potassium 4.2 5.4*   Chloride 101 98*   CO2 28 23   BUN 28.0* 40.0*   Creatinine 4.9* 6.9*   Glucose 211* 484*   Calcium 7.9* 8.1*       Recent Labs   Lab 02/05/21  0321 02/04/21  0227   ALT 16 23   AST (SGOT) 12 23   Bilirubin, Total 0.4 0.5   Albumin 2.3* 2.7*   Alkaline Phosphatase 155* 172*       Recent Labs   Lab 02/04/21  0645 02/04/21  0455   hs Troponin-I 109.2* 98.2*   hs Troponin-I Delta 11.0 5.9             Microbiology Results (last 15 days)       Procedure Component Value Units Date/Time    COVID-19 (SARS-CoV-2) only ([facility]) asymptomatic admission [[id number]] Collected: 02/04/21 0227    Order Status: Completed Specimen: Nasopharyngeal Updated: 02/04/21 0259     Purpose of COVID testing Screening     SARS-CoV-2 Specimen Source Nasal Swab     SARS CoV 2 Overall Result Not Detected     Comment: __________________________________________________  -A result of "Detected" indicates POSITIVE for the    presence of SARS CoV-2 RNA  -A result of "Not Detected" indicates NEGATIVE for the    presence of SARS CoV-2 RNA  __________________________________________________________  Test performed using the Roche cobas Liat SARS-CoV-2 assay. This assay is  only for use under the Food and Drug Administration's Emergency Use  Authorization. This is a real-time RT-PCR assay for the qualitative  detection of SARS-CoV-2 RNA. Viral nucleic acids may persist in vivo,  independent of viability. Detection of viral nucleic acid does not imply the  presence of infectious virus, or that virus nucleic acid is the cause of  clinical symptoms. Negative results do not  preclude SARS-CoV-2 infection and  should not be used as the sole basis for diagnosis, treatment or other  patient management decisions. Negative results must be combined with  clinical observations, patient  history, and/or epidemiological information.  Invalid results may be due to inhibiting substances in the specimen and  recollection should occur. Please see Fact Sheets for patients and providers  located:  [email or web address].com/         Narrative:      o Collect and clearly label specimen type:  o PREFERRED-Upper respiratory specimen: One Nasal Swab in  Transport Media.  o Hand deliver to laboratory ASAP  Indication for testing->Extended care facility admission to  semi private room  Screening             RADIOLOGY     Chest AP Portable    Result Date: 02/04/2021  Mild pulmonary edema. Delaney Meigs, MD  02/04/2021 3:04 AM    US Breast Left Ltd    Result Date: 01/07/2021   1. Subcutaneous edema and 3:00 region of left breast. No sonographic evidence of an obvious mass in the upper outer quadrant of left breast. 2. Prominent lymph node in left axilla with cortical thickening measuring 2.0 x 1.0 x 1.6 cm. 3. Diagnostic mammograms are recommended for a more complete evaluation. Next CATEGORY 0: INCOMPLETE - NEED ADDITIONAL IMAGING EVALUATION. WRITTEN RESULTS PROVIDED TO THE PATIENT PER MQ[facility] REQUIREMENTS.   BIRAD: 88 Isabelle Course, MD  01/07/2021 1:13 PM   Echo Results       Procedure Component Value Units Date/Time    Echocardiogram Adult Complete W Color Doppler Waveform [[id number]] Collected: 02/05/21 0902     Updated: 02/05/21 1254     MV Area (PHT) 7.86     RV Systolic Pressure 76.720     LA Volume Index (BP A-L) 0.032     MV E/e' (Average) 94.709     IVS Diastolic Thickness (2D) 6.28     LVID diastole (2D) 4.19     LVID systole (2D) 3.04     BP Mod LV Ejection Fraction 55.456     LA Dimension (2D) 4.4     AV Mean Gradient 4     AV Mean Gradient 4     AV Peak Velocity 142     AV Peak Velocity 141     AV Area (Cont Eq VTI) 1.953     AV Area (Cont Eq VTI) 1.87     Prox Ascending Aorta Diameter 3.3     MV E/A 1.66     MV E/A 1.7     RV Basal Diastolic Dimension 3.66     TAPSE 1.43     Aortic Valve  Findings No aortic stenosis.     Aortic Valve Findings No aortic regurgitation.     Pulmonary Valve Findings Pulmonic valve is not adequately visualized.     Mitral Valve Findings No mitral stenosis.     Mitral Valve Findings Trace mitral regurgitation.     Tricuspid Valve Findings No tricuspid stenosis.     Tricuspid Valve Findings Mild-Moderate tricuspid regurgitation.    Narrative:      Name:     ETHLEEN LORMAND  Age:     89 years  DOB:     04/15/1955  Gender:     Female  MRN:     [id number]  Wt:     238 lb  BSA:     2.24 m2  HR:     83 bpm  Systolic BP:     280 mmHg  Diastolic BP:     72 mmHg  Technical Quality:     Good  Exam Date/Time:     02/05/2021 9:02 AM  Study Site:     [facility]  Exam Room:     Bedside  Exam Type:     ECHOCARDIOGRAM ADULT COMPLETE W CLR/ DOPP WAVEFORM    Study Info  Indications      shortness of breath -  Procedure    Complete two-dimensional, color flow and spectral Doppler transthoracic  echocardiogram is performed.    Staff  Sonographer:     Steele Sizer  Ordering Physician:     Jamelle Rushing    62 in      History/Risk Factors  Hypertension:     Yes    Prior Cardiac Studies  Date of Prior Echo:     08-05-19      Summary    * Borderline right ventricular function.    * Normal left ventricular ejection fraction.    * No wall motion abnormalities seen.    * Mild-Moderate tricuspid regurgitation.    * By Simpson's, LV EF is 55%.      Findings  Left Ventricle    Concentric left ventricular remodeling. Normal left ventricular ejection  fraction. No wall motion abnormalities seen. By Simpson's, LV EF is 55%.  Right Ventricle    Normal right ventricular size. Borderline right ventricular function.      Left Atrium    Normal left atrial size.    Right Atrium    Normal right atrial size.      Aortic Valve    No aortic stenosis. No aortic regurgitation.    Pulmonary Valve    Pulmonic valve is not adequately visualized.    Mitral Valve    No mitral stenosis. Trace mitral regurgitation.    Tricuspid Valve     No tricuspid stenosis. Mild-Moderate tricuspid regurgitation.      Other Findings    Indeterminate diastolic function, but mitral in-flow and tissue Doppler data  are suggestive of elevated left atrial pressure.    Pericardium / Pleural Effusion    A  pleural effusion is present.    Aorta    Normal size aortic root. Normal size proximal ascending aorta.      Measurements  2D Measurements  ----------------------------------------------------------------------  Name                                 Value        Normal  ----------------------------------------------------------------------    Parasternal 2D  ----------------------------------------------------------------------   IVS Diastolic Thickness  (2D)                               1.27 cm     0.60-0.90   LVID Diastole (2D)                 4.19 cm     0.34-9.17    LVIW Diastolic Thickness  (2D)                               1.18 cm     0.60-0.95   LVID Systole (2D)                  3.04 cm  2.20-3.50   LVOT Diameter                      2.10 cm                 LA Dimension (2D)                  4.40 cm     2.70-3.80   Prox Asc Ao Diameter               3.30 cm     2.30-3.10     LV Ejection Fraction 2D  ----------------------------------------------------------------------  LV EF (BP MOD)                        55 %         54-74     Apical 2D Dimensions  ----------------------------------------------------------------------   RV Basal Diastolic  Dimension                          4.04 cm     2.50-4.10   LA Volume Index (BP A-L)        32.22 ml/m2       <=34.00   RA Area (4C)                     15.60 cm2       <=18.00   RV FAC (4C)                           16 %          >=35  M-mode Measurements  ----------------------------------------------------------------------  Name                                 Value         Normal  ----------------------------------------------------------------------    M-Mode  ----------------------------------------------------------------------  TAPSE                              1.43 cm        >=1.60  LVOT/Aortic Valve Doppler Measurements  ----------------------------------------------------------------------  Name                                 Value        Normal  ----------------------------------------------------------------------    LVOT Doppler  ----------------------------------------------------------------------  LVOT Peak Velocity                0.83 m/s                   AoV Doppler  ----------------------------------------------------------------------  AV Peak Velocity                  1.42 m/s                 AV Mean Gradient                    4 mmHg           <20   AV Area (Cont Eq VTI)             1.95 cm2        >=3.00  AV V1/V2 Ratio                        0.59  RVOT/Pulmonic Valve Doppler Measurements  ----------------------------------------------------------------------  Name                                 Value        Normal  ----------------------------------------------------------------------    PV Doppler  ----------------------------------------------------------------------  PV Peak Velocity                  0.81 m/s  Mitral Valve Measurements  ----------------------------------------------------------------------  Name                                 Value        Normal  ----------------------------------------------------------------------    MV Doppler  ----------------------------------------------------------------------  MV E Peak Velocity                1.27 m/s                 MV A Peak Velocity                0.77 m/s                 MV E/A                                1.66                   MV Annular TDI  ----------------------------------------------------------------------  MV Septal e' Velocity             0.06 m/s        >=0.08   MV E/e' (Septal)                      20.89        <=8.00   MV Lateral e' Velocity            0.05 m/s        >=0.10   MV E/e' (Lateral)                    25.40        <=8.00  Tricuspid Valve Measurements  ----------------------------------------------------------------------  Name                                 Value        Normal  ----------------------------------------------------------------------    TV Regurgitation Doppler  ----------------------------------------------------------------------  TR Peak Velocity                  2.29 m/s                 TR Peak Gradient                   21 mmHg                 RA Pressure                        15 mmHg           <=3   RV Systolic  Pressure               36 mmHg           <36  Aorta / Venous Measurements  ----------------------------------------------------------------------  Name                                 Value        Normal  ----------------------------------------------------------------------    IVC/SVC  ----------------------------------------------------------------------  IVC Diameter (Exp 2D)              2.06 cm        <=2.10    IVC Diameter Percent Change  (2D)                                   7 %          >=50          Results for orders placed or performed during the hospital encounter of 10/20/20   CT Head WO Contrast    Narrative    CT HEAD WO CONTRAST:10/21/2020 2:42 AM     CLINICAL HISTORY: .Head trauma, minor, normal mental status ()  patient fell and struck her head:Head trauma, minor, normal mental  status (Age )    TECHNIQUE: Axial images were obtained from the skull base to vertex  without contrast. Study is somewhat suboptimal and there are multiple  regions of beam hardening artifact in the periphery of the frontal lobes  bilaterally which does reduce sensitivity for detection of small regions  of interest parenchymal or subarachnoid blood      Comparison 07/01/19  FINDINGS: The ventricles and sulci are moderately prominent but  consistent with  patient's chronologic age. Decreased density is present  in the deep white matter of each hemisphere consistent with small vessel  disease change. No intracerebral mass or hemorrhage is present, and no  extra-axial collections are present.      Impression     No currently detectable acute intracranial hemorrhage or  mass effect or edema. Mildly suboptimal study for detection of small  amounts of hemorrhage due to multiple regions of beam hardening artifact  in the periphery of the frontal lobes..    Note: Note that CT scanning at this site  utilizes multiple dose  reduction techniques including automatic exposure control, adjustment of  the MAA and/or KVP according to patient's size and use of iterative  reconstruction technique    Elyn Peers, MD   10/21/2020 8:16 AM   Results for orders placed or performed during the hospital encounter of 06/23/19   MRI Brain WO Contrast    Narrative    Clinical History:  right sided weakness and numbness     Examination:  Multiplanar MRI of the brain was performed without intravenous contrast,  using standard departmental protocol.    Comparison:  CT head dated 06/23/2019  MRI brain dated 03/19/2006    Findings:    Diffusion weighted images of the brain demonstrate no evidence of acute  infarction.  There is no evidence of acute intracranial hemorrhage,  extra-axial collection, mass effect, midline shift, herniation or  hydrocephalus.  The ventricles, sulci and cisterns appear age  appropriate.   There are mild periventricular white matter T2 FLAIR  hyperintensities, which most likely represent chronic microangiopathic  changes. There are no signal abnormalities on the susceptibility  weighted sequences.   The major vascular flow voids are present.    The  visualized paranasal sinuses and mastoid air cells are clear.   The  surrounding soft tissues and osseous structures are unremarkable.        Impression    No evidence of acute infarction, intracranial hemorrhage, mass  effect  or hydrocephalus.    Gillermina Phy, MD   06/23/2019 8:04 PM       Signed,  Lisa Roca, MD  5:28 PM 02/05/2021

## 2021-02-05 NOTE — Plan of Care (Signed)
Problem: Renal Instability  Goal: Fluid and electrolyte balance are achieved/maintained  Flowsheets (Taken 02/04/2021 2356 by Oneita Jolly, RN)  Fluid and electrolyte balance are achieved/maintained:   Monitor intake and output every shift   Monitor/assess lab values and report abnormal values   Monitor daily weight   Assess and reassess fluid and electrolyte status  Goal: Free from infection  Flowsheets (Taken 02/04/2021 2356 by Oneita Jolly, RN)  Free from infection: Monitor/assess for signs and symptoms of infection     Problem: Patient Receiving Advanced Renal Therapies  Goal: Therapy access site remains intact  Flowsheets (Taken 02/04/2021 1532 by Lendell Caprice, RN)  Therapy access site remains intact: Assess therapy access site

## 2021-02-05 NOTE — UM Notes (Signed)
Admit to Observation (Order [id number])  Admission  Date: 02/04/2021   ED Diagnoses    ED Diagnosis   Diagnosis Comment Associated Orders   Final diagnoses   Hypertensive emergency -- --   Acute pulmonary edema -- --   NSTEMI (non-ST elevated myocardial infarction)         66 y.o. female with a past medical history of type 2 diabetes, end-stage renal disease on dialysis Monday Wednesday Friday and noncompliant with this presenting to the ED with gradually worsening shortness of breath over the course of the last 2 days.  Patient was last dialyzed 4 days ago, missed her appointment yesterday.  Reports associated cough that is nonproductive.  Patient still makes urine, is not on diuretic.    Past Medical History:   Diagnosis Date    Chronic obstructive pulmonary disease     Diabetes mellitus     Hyperlipidemia     Hypertension     Sleep apnea 2018         02/04/21 1830 -- -- -- 92 100 % -- 31 (Abnormal)   150/72 -- -- -- -- -- -- JC   02/04/21 1825 -- -- -- 86 100 % -- 22 174/94 (Abnormal)              -- -- 93 95 % -- 20 228/100       Hgb 10.5 (*)       Hematocrit 34.4 (*)       Platelets 102 (*)       RBC 3.55 (*)       MCV 96.9 (*)       MCHC 30.5 (*)        Glucose 484 (*)       BUN 40.0 (*)       Creatinine 6.9 (*)       Potassium 5.4 (*)       Chloride 98 (*)       Calcium 8.1 (*)       Albumin 2.7 (*)       Alkaline Phosphatase 172 (*)       Albumin/Globulin Ratio 0.8 (*)        hs Troponin-I 92.3 (*)      hs Troponin-I 98.2 (*)     hs Troponin-I    109.2 Panic      Chest AP Portable (Final result)  Result time 02/04/21 03:04:58  Final result by Dolores Patty, MD (02/04/21 03:04:58)                Impression:      Mild pulmonary edema.           Delaney Meigs, MD    02/04/2021 3:04 AM                Whole Blood Glucose POCT 344 (*)       12 l ekg  Final result                Narrative:    SINUS RHYTHM WITH PREMATURE SUPRAVENTRICULAR COMPLEXES   RIGHT AXIS DEVIATION   LOW VOLTAGE QRS   Cannot rule out ANTERIOR  MYOCARDIAL INFARCTION (CITED ON OR BEFORE 05-Jan-2021)   ABNORMAL ECG   WHEN COMPARED WITH ECG OF 05-Jan-2021 09:42,   NONSPECIFIC T WAVE ABNORMALITY NOW EVIDENT IN ANTERIOR LEADS   Confirmed by CHANG MD, ANTHONY (1) on 02/04/2021 6:27:46 PM  [facility] MEDS   02/04/2021 0432 EST furosemide (LASIX) injection 120 mg 120 mg Intravenous Given Margarette Canada, RN --    02/04/2021 0506 EST nitroglycerin (NITROSTAT) SL tablet 0.4 mg 0.4 mg Sublingual Given Margarette Canada, RN --    02/04/2021 0500 EST nitroglycerin (NITROSTAT) SL tablet 0.4 mg 0.4 mg Sublingual Given Margarette Canada, RN --    02/04/2021 0425 EST nitroglycerin (NITROSTAT) SL tablet 0.4 mg 0.4 mg Sublingual Given Margarette Canada, RN --    02/04/2021 0532 EST hydrALAZINE (APRESOLINE) injection 10 mg 10 mg Intravenous Given Margarette Canada, RN --    02/04/2021 0535 EST metoprolol tartrate (LOPRESSOR) injection 5 mg 5 mg Intravenous Given Margarette Canada, RN --    02/04/2021 7342 EST aspirin chewable tablet 324 mg 324 mg Oral Given Anthony Sar, RN --    02/04/2021 1048 EST amLODIPine (NORVASC) tablet 10 mg 10 mg Oral Given Debarah Crape, RN --    02/04/2021 1149 EST pantoprazole (PROTONIX) EC tablet 40 mg 40 mg Oral Given Debarah Crape, RN --    02/04/2021 1149 EST insulin lispro injection 2-10 Units 10 Units Subcutaneous Given Debarah Crape, RN --       H&P    ASSESSMENT & PLAN      [name] is a 66 y.o. female admitted under [facility] with Hypertensive emergency.     Patient Active Hospital Problem List:  Hypertensive emergency (02/04/2021)     Patient seen by both intensivist and cardiology.  No need to admit to ICU at the present time blood pressure seems to have improved.  Reintroduce home antihypertensive visit.  Ultrafiltration /dialysis will help with fluid overload     Dyspnea/acute respiratory failure  In the setting of pulmonary edema due to noncompliance with  dialysis leading to fluid overload  Dialysis and ultrafiltration as outlined above     Elevated troponin  Likely in the setting of CKD, hypertension and demand ischemia        ESRD on dialysis  Patient on dialysis and missed dialysis 2 days prior  I suspect this result was imparts the cause of presented to the hospital with dyspnea and pulmonary edema and hypertensive urgency  Dialysis orders.  Dr. Corky Mull and associates     Diabetes mellitus  Add sliding scale coverage to regimen  Anticipate with after add long-acting insulin in the form of Lantus to regimen.     Peripheral neuropathy  Resume home dose of Neurontin.     Noncompliance with medical follow-up and dialysis  Importance of prompt compliance discussed.        Hyperlipidemia  Resume statins.    Sleep apnea by history  May need CPAP at bedtime.        Obesity  Diet exercise weight loss and lifestyle modification.     COPD by history  Resume home inhalers.        Anemia  Likely secondary to CKD we will continue to monitor  Use ESA based on guidelines.  Check iron profile.        GERD  Resume PPI    Nutrition  Consistent carbohydrate cardiac diet     DVT/VTE Prophylaxis  Subcu heparin     Anticipated medical stability for discharge: 1 to 2 days.     Service status/Reason for ongoing hospitalization: Dialyze manage fluid overload as well as control blood pressure  Anticipated Discharge Needs: None apparent        1/13 CARD CONSULT  Assessment:   Dyspnea - There is likely some contributing of flash pulmonary edema in the setting of hypertensive crisis and volume overload due to missed dialysis. She has a minimally elevated troponin, which she often has, and likely represents demand ischemia in hypertension and ESRD. The patient likely has chronic coronary artery disease, but I do not find her presentation consistent with acute coronary syndrome.  Hypertensive crisis  Elevated troponin  ESRD  Diabetes Mellitus     Recommendations:   Dialysis for both  volume removal and hypertension.  Would keep BP < 537 systolic over the course of the day today and can titrate down further tomorrow.  Re-introduce home amlodipine this morning. Would add coreg/hydralazine over the course of the day as blood pressure tolerates.  Treat with daily aspirin 81mg  and statin for presumed coronary disease.  Repeat an echo.        NEPH CONSULT    Assessment:          Patient Active Problem List   Diagnosis    Iron deficiency anemia secondary to inadequate dietary iron intake    Sideropenic dysphagia    Peritoneal dialysis catheter dysfunction, initial encounter    Peritonitis    Upper GI bleed    ESRD (end stage renal disease) on dialysis    Generalized abdominal pain    Thrombocytopenia    h/o orthostatic hypotension    Type 2 diabetes mellitus, with long-term current use of insulin    GERD (gastroesophageal reflux disease)    Bowel incontinence    Right sided numbness    Uncontrolled type 2 diabetes mellitus with hyperglycemia    Hypertension    Hyperlipidemia    History of COPD    Gastritis with hemorrhage    History of anemia due to chronic kidney disease    Open wound, abdominal wall, lateral, subsequent encounter    Gout    Dizziness    Fall    History of CVA (cerebrovascular accident)    Pneumonia due to COVID-19 virus    Diarrhea    Hypertensive urgency    Shortness of breath    ESRD (end stage renal disease)    Hyperkalemia    Pulmonary edema    Hypervolemia    Elevated troponin    Chest pain    Nausea vomiting and diarrhea    Breast pain, left    Syncope    Hypertensive emergency         ESRD on HD MWF at [facility] - followed by Dr. Randel Pigg  - missed HD on 1/11 due to diarrhea/not feeling well   Accelerated hypertension due to volume overload in the setting of missed HD  Volume overload  Anemia in CKD   Hyperphosphatemia - controlled  Secondary hyperparathyroidism     Recommendations:      Dialyzed today per routine schedule, 3.5L net UF  UF treatment tomorrow aiming for  additional 3.5-4L removal   Anticipate no needs for HD on Sunday; plan for routine HD on Monday   Continue sevelamer  Check PTH  Resume OP antihypertensive medications  Salt and fluid restriction           1/14 CARD PN    Assessment:   Admitted with hypertensive emergency in the setting of missed dialysis. Minimal troponin elevation that I would not treat as acute coronary syndrome. Blood pressure improved, but still on the higher side. Drowsy this morning.  Echo 02/05/2021:  * Borderline right ventricular function.    *  Normal left ventricular ejection fraction.    * No wall motion abnormalities seen.    * Mild-Moderate tricuspid regurgitation.    * By Simpson's, LV EF is 55%.     Presumed CAD.        Recommendations:      Add hydralazine 25mg  PO TID (ordered).  Continue amlodipine.  Continue aspirin, statin.      Subjective:   Drowsy, but wakes to voice.     Medications:       Scheduled Meds: PRN Meds:    amLODIPine, 10 mg, Oral, Daily  aspirin, 81 mg, Oral, Daily  atorvastatin, 40 mg, Oral, QHS  dextromethorphan polistirex ER, 30 mg, Oral, Q12H SCH  gabapentin, 400 mg, Oral, Q8H  insulin glargine, 15 Units, Subcutaneous, QHS  insulin lispro, 1-6 Units, Subcutaneous, QHS  insulin lispro, 2-10 Units, Subcutaneous, TID AC  insulin lispro, 5 Units, Subcutaneous, TID AC  pantoprazole, 40 mg, Oral, QAM AC  sevelamer, 800 mg, Oral, BID Meals           Continuous Infusions:    albuterol, 5 mg, Q6H PRN  dextrose, 15 g of glucose, PRN   And  dextrose, 12.5 g, PRN   And  dextrose, 12.5 g, PRN   And  glucagon (rDNA), 1 mg, PRN  hydrALAZINE, 10 mg, Q6H PRN  oxyCODONE, 5 mg, Q6H PRN      BP 150/69   Pulse 81   Temp 98.7 F (37.1 C)   Resp 20   Ht 1.585 m (5' 2.4")   Wt 107.8 kg (237 lb 10.5 oz)   LMP  (LMP Unknown) Comment: post menopausal  SpO2 97%   BMI 42.91 kg/m  O2 Flow Rate (L/min): 2 L/min O2 Device: Nasal cannula     02/05/21 1537 97.5 F (36.4 C) -- 83 40 Abnormal  153/85 99 % 2 L/min       02/05/2021 0308       Gross per 24 hour   Intake --   Output 3850 ml   Net -3850 ml      02/05/21  0321   Protein, Total 5.4*   Albumin 2.3*        02/05/21  0321 02/04/21  0227   Hgb 9.9* 10.5*   Hematocrit 32.6* 34.4*   Platelets 90* 102*           Recent Labs   Lab 02/05/21  0321 02/04/21  0227   Potassium 4.2 5.4*   Chloride 101 98*   BUN 28.0* 40.0*   Creatinine 4.9* 6.9*   EGFR 10.7 7.2   Glucose 211* 484*   Calcium 7.9* 8.1*       1/14 HD:  Completed 3 hours of UF. 3.6 L UF as tolerated.    Marciano Sequin RN, BSN, ACM-RN, CCM  Utilization Review RN Case Manager [place]  Utilization Review Department  [address] [place]  [place], [place] [address]  T [phone] (Confidential voice mail) C [phone] (Confidential voice mail)   Jerilynn Mages [phone] Wanda Plump [phone]  [email or web address]

## 2021-02-05 NOTE — Progress Notes (Signed)
[facility] PROGRESS NOTE  Aaron Edelman, x [phone] ([place])      Date Time: 02/05/21 6:21 PM  Patient Name: [name]  Attending Physician: Lisa Roca, MD    CC: follow-up ESRD, volume overload     Assessment:   ESRD on HD MWF at [facility] - followed by Dr. Randel Pigg  - missed HD on 1/11 due to diarrhea/not feeling well   Accelerated hypertension due to volume overload in the setting of missed HD - resolved   Volume overload  Anemia in CKD   Hyperphosphatemia - controlled  Secondary hyperparathyroidism    Recommendations:     S/p HD yesterday with 3.5L net UF  S/p UF today with 3.6L net UF  Plan for additional 2hr UF tomorrow   Routine HD on Monday       Case discussed with: patient, HD RN    Verl Blalock, MD, MD  [facility]  [phone] (office)  X [phone] (FFX [facility]Link)      Subjective: feels better but still complaining of cough    Review of Systems:   Cardio: no chest pain, edema  Pulm: cough  Physical Exam:     Vitals:    02/05/21 1515 02/05/21 1525 02/05/21 1537 02/05/21 1638   BP: 162/73 142/65 153/85 149/85   Pulse: 83 83 83    Resp: 20 20 (!) 40    Temp: 97.6 F (36.4 C) 97.6 F (36.4 C) 97.5 F (36.4 C)    TempSrc:   Oral    SpO2:  95% 99%    Weight:   99.8 kg (220 lb)    Height:           Intake and Output Summary (Last 24 hours) at Date Time    Intake/Output Summary (Last 24 hours) at 02/05/2021 1821  Last data filed at 02/05/2021 1525  Gross per 24 hour   Intake --   Output 7200 ml   Net -7200 ml       General: awake, alert, oriented x 3, no acute distress.  Cardiovascular: regular rate and rhythm  Lungs: few crackles noted, no wheezing, bilateral air entry  Abdomen: soft  Extremities: trace-1+edema    LUE AVF    Meds:      Scheduled Meds: PRN Meds:    albuterol-ipratropium, 3 mL, Nebulization, Q6H SCH  amLODIPine, 10 mg, Oral, Daily  aspirin, 81 mg, Oral, Daily  atorvastatin, 40 mg, Oral, QHS  cefTRIAXone, 2 g, Intravenous,  Q24H  dextromethorphan polistirex ER, 30 mg, Oral, Q12H SCH  gabapentin, 400 mg, Oral, Q8H  hydrALAZINE, 25 mg, Oral, Q8H SCH  insulin glargine, 15 Units, Subcutaneous, QHS  insulin lispro, 1-6 Units, Subcutaneous, QHS  insulin lispro, 2-10 Units, Subcutaneous, TID AC  insulin lispro, 5 Units, Subcutaneous, TID AC  pantoprazole, 40 mg, Oral, QAM AC  sevelamer, 800 mg, Oral, BID Meals          Continuous Infusions:   albuterol, 5 mg, Q6H PRN  dextrose, 15 g of glucose, PRN   And  dextrose, 12.5 g, PRN   And  dextrose, 12.5 g, PRN   And  glucagon (rDNA), 1 mg, PRN  hydrALAZINE, 10 mg, Q6H PRN  oxyCODONE, 5 mg, Q6H PRN              Labs:     Recent Labs   Lab 02/05/21  0321 02/04/21  0227   WBC 4.58 5.11   Hgb 9.9* 10.5*  Hematocrit 32.6* 34.4*   Platelets 90* 102*     Recent Labs   Lab 02/05/21  0321 02/04/21  0227   Sodium 138 136   Potassium 4.2 5.4*   Chloride 101 98*   CO2 28 23   BUN 28.0* 40.0*   Creatinine 4.9* 6.9*   Calcium 7.9* 8.1*   Albumin 2.3* 2.7*   Glucose 211* 484*   EGFR 10.7 7.2       Signed by: Verl Blalock, MD, MD

## 2021-02-05 NOTE — Progress Notes (Signed)
Hd tx started via L AVF, working wellm pt AOX4, denies any discomfort as of, vs stable, pt on 2L O2 via nasal cannula, Report received from Steen.   02/05/21 1200   Bedside Nurse Communication   Name of bedside RN - pre dialysis Edwena Felty RN   Treatment Initiation- With Dialysis Precautions   Time Out/Safety Check Completed Yes   Consent for HD signed for this hospitalization (Date) 02/04/21   Consent for HD signed for this hospitalization (Time) 1450   Preferred language No   Blood Consent Verified N/A   Dialysis Precautions All Connections Secured   Dialysis Treatment Type Routine   Is patient diabetic? Yes   RO/Hemodialysis Architectural technologist   Is Total Chlorine less than 0.1 ppm? Yes   Orignial Total Chlorine Testing Time 1100   At 4 Hour Total Chlorine Testing Time 1500   RO/Hemodialysis Facilities manager Number 2    Machine Serial Number [id number]   RO # 27   RO Serial # [id number]   Water Hardness NA   pH 7.2   Pressure Test Verified Yes   Alarms Verified Passed   Machine Temperature 98.6 F (37 C)   Alarms Verified Yes   Na+ mEq (Machine) 138 mEq   Bicarb mEq (Machine) 353 mEq   Hemodialysis Conductivity (Machine) 13.6   Hemodialysis Conductivity (Meter) 13.8   Dialyzer Lot Number [id number]   Tubing Lot Number [id number]   RO Machine Log Completed Yes   Hepatitis Status   HBsAg (Antigen) Result Negative   HBsAg Date Drawn 02/04/21   HBsAg Repeat Draw Due Date 03/04/21   Dialysis Weight   Pre-Treatment Weight (Kg) 107.8   Scale Type ICU Bed Scale   Vitals   Temp 98.7 F (37.1 C)   Heart Rate 81   Resp Rate (!) 26   BP 146/66   SpO2 96 %   Assessment   Mental Status Alert;Oriented;Cooperative   Cardiac (WDL) X   Cardiac Regularity Regular   Cardiac Symptoms None   Cardiac Rhythm Sinus Arrhythmia   Respiratory  X   Respiratory Pattern Regular   Bilateral Breath Sounds Diminished   R Breath Sounds Diminished   L Breath Sounds Diminished   Cough Spontaneous   Edema  X   Generalized Edema +1    Facial None   Sacral UTA   RLE Edema +1   LLE Edema +1   General Skin Color Appropriate for ethnicity   Skin Condition/Temp Warm;Dry   Gastrointestinal (WDL) X   Abdomen Inspection Rounded   GI Symptoms None   Mobility Bed   Graft/Fistula Left   No placement date or time found.   Access Type: Arteriovenous Fistula  Orientation: Left  Graft/Fistula Location: Upper Arm   Fistula/ Graft Assessment Abnormalities WDL   Needle Size 15 Gauge   Pain Assessment   Charting Type Assessment   Pain Scale Used Numeric Scale (0-10)   Numeric Pain Scale   Pain Score 0   POSS Score 1   Multiple Pain Sites No   Hemodialysis Comments   Pre-Hemodialysis Comments TIMEOUT & SAFETY CHECKS

## 2021-02-06 LAB — CBC AND DIFFERENTIAL
Absolute NRBC: 0 10*3/uL (ref 0.00–0.00)
Basophils Absolute Automated: 0.01 10*3/uL (ref 0.00–0.08)
Basophils Automated: 0.2 %
Eosinophils Absolute Automated: 0.12 10*3/uL (ref 0.00–0.44)
Eosinophils Automated: 2.5 %
Hematocrit: 33.1 % — ABNORMAL LOW (ref 34.7–43.7)
Hgb: 9.8 g/dL — ABNORMAL LOW (ref 11.4–14.8)
Immature Granulocytes Absolute: 0.01 10*3/uL (ref 0.00–0.07)
Immature Granulocytes: 0.2 %
Instrument Absolute Neutrophil Count: 3.43 10*3/uL (ref 1.10–6.33)
Lymphocytes Absolute Automated: 0.76 10*3/uL (ref 0.42–3.22)
Lymphocytes Automated: 15.6 %
MCH: 28.8 pg (ref 25.1–33.5)
MCHC: 29.6 g/dL — ABNORMAL LOW (ref 31.5–35.8)
MCV: 97.4 fL — ABNORMAL HIGH (ref 78.0–96.0)
MPV: 12.9 fL — ABNORMAL HIGH (ref 8.9–12.5)
Monocytes Absolute Automated: 0.54 10*3/uL (ref 0.21–0.85)
Monocytes: 11.1 %
Neutrophils Absolute: 3.43 10*3/uL (ref 1.10–6.33)
Neutrophils: 70.4 %
Nucleated RBC: 0 /100 WBC (ref 0.0–0.0)
Platelets: 94 10*3/uL — ABNORMAL LOW (ref 142–346)
RBC: 3.4 10*6/uL — ABNORMAL LOW (ref 3.90–5.10)
RDW: 14 % (ref 11–15)
WBC: 4.87 10*3/uL (ref 3.10–9.50)

## 2021-02-06 LAB — COMPREHENSIVE METABOLIC PANEL
ALT: 14 U/L (ref 0–55)
AST (SGOT): 10 U/L (ref 5–41)
Albumin/Globulin Ratio: 0.8 — ABNORMAL LOW (ref 0.9–2.2)
Albumin: 2.4 g/dL — ABNORMAL LOW (ref 3.5–5.0)
Alkaline Phosphatase: 185 U/L — ABNORMAL HIGH (ref 37–117)
Anion Gap: 10 (ref 5.0–15.0)
BUN: 50 mg/dL — ABNORMAL HIGH (ref 7.0–21.0)
Bilirubin, Total: 0.3 mg/dL (ref 0.2–1.2)
CO2: 27 mEq/L (ref 17–29)
Calcium: 7.9 mg/dL — ABNORMAL LOW (ref 8.5–10.5)
Chloride: 100 mEq/L (ref 99–111)
Creatinine: 6.2 mg/dL — ABNORMAL HIGH (ref 0.4–1.0)
Globulin: 3.2 g/dL (ref 2.0–3.6)
Glucose: 365 mg/dL — ABNORMAL HIGH (ref 70–100)
Potassium: 4.7 mEq/L (ref 3.5–5.3)
Protein, Total: 5.6 g/dL — ABNORMAL LOW (ref 6.0–8.3)
Sodium: 137 mEq/L (ref 135–145)

## 2021-02-06 LAB — GLUCOSE WHOLE BLOOD - POCT
Whole Blood Glucose POCT: 275 mg/dL — ABNORMAL HIGH (ref 70–100)
Whole Blood Glucose POCT: 318 mg/dL — ABNORMAL HIGH (ref 70–100)
Whole Blood Glucose POCT: 340 mg/dL — ABNORMAL HIGH (ref 70–100)
Whole Blood Glucose POCT: 287 mg/dL — ABNORMAL HIGH (ref 70–100)

## 2021-02-06 LAB — GFR: EGFR: 8.2

## 2021-02-06 MED ORDER — SEVELAMER CARBONATE 800 MG PO TABS
800.0000 mg | ORAL_TABLET | Freq: Three times a day (TID) | ORAL | Status: DC
Start: 2021-02-06 — End: 2021-02-08
  Administered 2021-02-06 – 2021-02-08 (×6): 800 mg via ORAL
  Filled 2021-02-06 (×7): qty 1

## 2021-02-06 MED ORDER — NALOXONE HCL 0.4 MG/ML IJ SOLN (WRAP)
0.4000 mg | INTRAMUSCULAR | Status: DC | PRN
Start: 2021-02-06 — End: 2021-02-08

## 2021-02-06 MED ORDER — GUAIFENESIN 100 MG/5ML PO LIQD
200.0000 mg | ORAL | Status: DC | PRN
Start: 2021-02-06 — End: 2021-02-08
  Administered 2021-02-06 – 2021-02-07 (×5): 200 mg via ORAL
  Filled 2021-02-06 (×5): qty 10

## 2021-02-06 NOTE — Plan of Care (Signed)
Problem: Renal Instability  Goal: Fluid and electrolyte balance are achieved/maintained  Outcome: Progressing  Flowsheets (Taken 02/06/2021 0933)  Fluid and electrolyte balance are achieved/maintained:   Assess and reassess fluid and electrolyte status   Monitor/assess lab values and report abnormal values  Goal: Free from infection  Outcome: Progressing  Flowsheets (Taken 02/06/2021 0933)  Free from infection:   Monitor/assess for signs and symptoms of infection   Assess need for indwelling catheter every shift and discuss with LIP     Problem: Patient Receiving Advanced Renal Therapies  Goal: Therapy access site remains intact  Outcome: Progressing  Flowsheets (Taken 02/06/2021 0933)  Therapy access site remains intact:   Assess therapy access site   Change therapy access site dressing as needed

## 2021-02-06 NOTE — Progress Notes (Signed)
[place]       Date Time: 02/06/21 1:48 PM  Patient Name: [name], [name]  MRN: [id number]  CSN:  [id number]  Date of Admission:  02/04/2021           Assessment:   Admitted with hypertensive emergency in the setting of missed dialysis. Minimal troponin elevation that I would not treat as acute coronary syndrome. Blood pressure improved, but still on the higher side. Drowsy this morning.  Echo 02/05/2021:  * Borderline right ventricular function.    * Normal left ventricular ejection fraction.    * No wall motion abnormalities seen.    * Mild-Moderate tricuspid regurgitation.    * By Simpson's, LV EF is 55%.     Presumed CAD.      Recommendations:     Titrate hydralazine as needed for blood pressure control  Continue amlodipine.  Continue aspirin, statin.    We will sign off. Please call with questions.      Subjective:   Denies chest pain, SOB or palpitations.  Seen in dialysis. Alert.    Medications:      Scheduled Meds: PRN Meds:    albuterol-ipratropium, 3 mL, Nebulization, Q6H SCH  amLODIPine, 10 mg, Oral, Daily  aspirin, 81 mg, Oral, Daily  atorvastatin, 40 mg, Oral, QHS  cefTRIAXone, 2 g, Intravenous, Q24H  dextromethorphan polistirex ER, 30 mg, Oral, Q12H SCH  gabapentin, 400 mg, Oral, Q8H  hydrALAZINE, 25 mg, Oral, Q8H SCH  insulin glargine, 15 Units, Subcutaneous, QHS  insulin lispro, 1-6 Units, Subcutaneous, QHS  insulin lispro, 2-10 Units, Subcutaneous, TID AC  insulin lispro, 5 Units, Subcutaneous, TID AC  pantoprazole, 40 mg, Oral, QAM AC  sevelamer, 800 mg, Oral, BID Meals        Continuous Infusions:   albuterol, 5 mg, Q6H PRN  dextrose, 15 g of glucose, PRN   And  dextrose, 12.5 g, PRN   And  dextrose, 12.5 g, PRN   And  glucagon (rDNA), 1 mg, PRN  guaiFENesin, 200 mg, Q4H PRN  hydrALAZINE, 10 mg, Q6H PRN  naloxone, 0.4 mg, PRN  oxyCODONE, 5 mg, Q6H PRN          Home Meds:   Medications Prior to Admission   Medication Sig Dispense Refill Last Dose     amLODIPine (NORVASC) 10 MG tablet Take 1 tablet by mouth daily   02/03/2021    aspirin 81 MG chewable tablet Chew 81 mg by mouth daily   02/03/2021    atorvastatin (LIPITOR) 40 MG tablet Take 40 mg by mouth daily   02/03/2021    carvedilol (COREG) 25 MG tablet Take 1 tablet by mouth 2 (two) times daily with meals   02/03/2021    gabapentin (NEURONTIN) 600 MG tablet Take 1 capsule by mouth every 8 (eight) hours   02/03/2021    insulin aspart (NovoLOG) 100 UNIT/ML injection Inject 15 Units into the skin 3 (three) times daily before meals   02/03/2021    Insulin Degludec (TRESIBA SC) Inject 35 Unit into the skin   02/03/2021    LACTOBACILLUS PO Take 1 capsule by mouth   02/03/2021    pantoprazole (PROTONIX) 40 MG tablet Take 40 mg by mouth daily   02/03/2021    sevelamer (RENVELA) 800 MG tablet Take 800 mg by mouth 2 (two) times daily with meals   02/03/2021    acetaminophen (TYLENOL) 500 MG tablet Take 500 mg by mouth every 6 (six) hours  as needed for Pain       albuterol sulfate HFA (PROVENTIL) 108 (90 Base) MCG/ACT inhaler Inhale 2 puffs into the lungs every 6 (six) hours as needed for Wheezing or Shortness of Breath             Physical Exam:   BP 149/68   Pulse 90   Temp 98.2 F (36.8 C)   Resp 20   Ht 1.585 m (5' 2.4")   Wt 99.6 kg (219 lb 9.3 oz)   LMP  (LMP Unknown) Comment: post menopausal  SpO2 95%   BMI 39.65 kg/m  O2 Flow Rate (L/min): 2 L/min O2 Device: None (Room air)    Temp (24hrs), Avg:97.9 F (36.6 C), Min:97.4 F (36.3 C), Max:98.2 F (36.8 C)     Intake and Output Summary (Last 24 hours) at Date Time    Intake/Output Summary (Last 24 hours) at 02/06/2021 1348  Last data filed at 02/06/2021 1130  Gross per 24 hour   Intake 1950 ml   Output 6800 ml   Net -4850 ml       General: Alert, not in distress.  Head: normocephalic  Eyes: Lids & conjunctiva normal  Cardiovascular: regular rate and rhythm, soft S1, S2, no S3, no S4, no murmurs, rubs or gallops  Neck: JVD difficult to assess (habitus)  Lungs:  Bilateral crackles.  Abdomen: soft, non-tender.  Extremities: no edema  Pulse: 2+ radial pulses.  Neurological: No gross motor defect  Musculoskeletal: normal strength and tone    Labs:     Recent Labs   Lab 02/04/21  0645 02/04/21  0455   hs Troponin-I 109.2* 98.2*   hs Troponin-I Delta 11.0 5.9     Recent Labs   Lab 02/06/21  0853   Bilirubin, Total 0.3   Protein, Total 5.6*   Albumin 2.4*   ALT 14   AST (SGOT) 10             Recent Labs   Lab 02/06/21  0853 02/05/21  0321 02/04/21  0227   WBC 4.87 4.58 5.11   Hgb 9.8* 9.9* 10.5*   Hematocrit 33.1* 32.6* 34.4*   Platelets 94* 90* 102*     Recent Labs   Lab 02/06/21  0853 02/05/21  0321 02/04/21  0227   Sodium 137 138 136   Potassium 4.7 4.2 5.4*   Chloride 100 101 98*   CO2 27 28 23    BUN 50.0* 28.0* 40.0*   Creatinine 6.2* 4.9* 6.9*   EGFR 8.2 10.7 7.2   Glucose 365* 211* 484*   Calcium 7.9* 7.9* 8.1*       Lab Results   Component Value Date    BNP 2,878 (H) 10/20/2020       Weight Monitoring 01/12/2021 02/04/2021 02/04/2021 02/04/2021 02/05/2021 02/05/2021 02/06/2021   Height - 157.5 cm 158.5 cm 158.5 cm - - -   Height Method - - - - - - -   Weight 93.8 kg 93.895 kg 108.1 kg 108.1 kg 107.8 kg 99.791 kg 99.6 kg   Weight Method Bed Scale - - - - Bed Scale Standing Scale   BMI (calculated) - 37.9 kg/m2 43.1 kg/m2 43.1 kg/m2 - - -         Imaging:   Radiological Procedure        Signed by: Jamelle Rushing, MD    [place] [phone] (8am-5pm)  NP [facility] [phone] (8am-2pm)   After hours, non urgent consult line  [phone]  After Hours, urgent consults [phone]  Office/Pager: [phone]

## 2021-02-06 NOTE — Progress Notes (Signed)
Completed 2 hours of UF, 3.1 L UF tolerated well. Report given to [facility].     02/06/21 1130   Treatment Summary   Time Off Machine 1120   Duration of Treatment (Hours) 2   Treatment Type 1:1   Dialyzer Clearance Moderately streaked   Fluid Volume Off (mL) 3500   Prime Volume (mL) 200   Rinseback Volume (mL) 200   Fluid Given: Normal Saline (mL) 0   Fluid Given: PRBC  0 mL   Fluid Given: Albumin (mL) 0   Fluid Given: Other (mL) 0   Total Fluid Given 400   Hemodialysis Net Fluid Removed 3100   Post Treatment Assessment   Post-Treatment Weight (Kg) 96.5   Patient Response to Treatment tolerated well   Graft/Fistula Left   No placement date or time found.   Access Type: Arteriovenous Fistula  Orientation: Left  Graft/Fistula Location: Upper Arm   Fistula/ Graft Assessment Abnormalities WDL   Needle Size 15 Gauge   Cannulation Sites held Arterial (min) 10   Cannulation Sites held Venous (min) 10   Hemostasis Achieved Yes   Vitals   Temp 98.2 F (36.8 C)   Heart Rate 90   Resp Rate 20   BP 149/68   SpO2 95 %   O2 Device None (Room air)   Assessment   Mental Status Alert;Oriented;Cooperative   Cardiac (WDL) WDL   Cardiac Regularity Regular   Cardiac Symptoms None   Cardiac Rhythm Normal Sinus Rhythm   Respiratory  WDL   Respiratory Pattern Regular   Bilateral Breath Sounds Clear;Diminished   Cough Spontaneous;Dry   Edema  WDL   Generalized Edema None   Facial Non Pitting Edema   Sacral UTA   RLE Edema Non Pitting Edema   LLE Edema Non Pitting Edema   General Skin Color Appropriate for ethnicity   Skin Condition/Temp Warm;Dry   Gastrointestinal (WDL) WDL   Abdomen Inspection Soft;Nondistended   GI Symptoms None   Mobility Ambulatory with Assistance   Pain Assessment   Charting Type Assessment   Pain Scale Used Numeric Scale (0-10)   Numeric Pain Scale   Pain Score 0   POSS Score 1   Education   Person taught Patient   Knowledge basis Substantial   Topics taught Access care;Fluid Management;Procedure   Teaching Tools  Explain   [name] Understanding   Bedside Nurse Communication   Name of bedside RN - post dialysis Edwena Felty

## 2021-02-06 NOTE — Plan of Care (Addendum)
Significant Event: Vss. Afebrile. Denies pain. HD completed today. Ambulates to restroom x1 assistance. Safety protocols followed. Purposeful rounding completed.   Neuro: A/ox4. Appropriate mood and affect.  Cardiac: NSR on telemetry. Denies chest pain.  Respiratory: Weaned off of 2L NC. Diminished LS. Even and unlabored respirations.  GI: Active bowel sounds present. Tolerates renal diet. No N/V/D. Nontender abd. No BM this shift.  GU: Yellow/straw output without odor.   Skin: Clean and intact.    Problem: Safety  Goal: Patient will be free from injury during hospitalization  Outcome: Progressing  Goal: Patient will be free from infection during hospitalization  Outcome: Progressing     Problem: Pain  Goal: Pain at adequate level as identified by patient  Outcome: Progressing     Problem: Side Effects from Pain Analgesia  Goal: Patient will experience minimal side effects of analgesic therapy  Outcome: Progressing     Problem: Discharge Barriers  Goal: Patient will be discharged home or other facility with appropriate resources  Outcome: Progressing     Problem: Renal Instability  Goal: Fluid and electrolyte balance are achieved/maintained  Outcome: Progressing  Goal: Free from infection  Outcome: Progressing     Problem: Patient Receiving Advanced Renal Therapies  Goal: Therapy access site remains intact  Outcome: Progressing     Problem: Compromised Tissue integrity  Goal: Damaged tissue is healing and protected  Outcome: Progressing  Goal: Nutritional status is improving  Outcome: Progressing     Problem: Hemodynamic Status: Cardiac  Goal: Stable vital signs and fluid balance  Outcome: Progressing

## 2021-02-06 NOTE — Progress Notes (Signed)
[facility]  PROGRESS NOTE      Patient: [name]  Date: 02/06/2021   LOS: 0 Days  Admission Date: 02/04/2021   MRN: [id number]  Attending: Lisa Roca, MD  Please contact me on SecureChat       ASSESSMENT/PLAN     [name] is a 66 y.o. female admitted with Hypertensive emergency    Interval Summary:     Patient Active Hospital Problem List:  Hypertensive emergency (02/04/2021)     Likely secondary to fluid overload and missing dialysis and questionable compliance with medications  Blood pressure is improved with dialysis ultrafiltration and resumption of antihypertensive medications    End-stage renal disease on dialysis  Continue dialysis per nephrology    Flash pulmonary edema/fluid overload  Secondary to hypertensive urgency and missing dialysis  Clinically improved--dyspnea has improved with successive dialysis and ultrafiltration  Plan to dialyze again in a.m. and hopefully discharge after that    Acute respiratory failure with hypoxia  Secondary to fluid overload  Improving but still symptomatic    Acute bronchitis  Patient has developed a cough productive of brownish sputum  Continue empiric antibiotics to regimen  Aggressive pulmonary toileting.    Diabetes mellitus with hyperglycemia  Continue sliding scale coverage and Lantus  Will adjust  Doses based on blood sugar readings.    COPD by history  Inhalers      Hyperlipidemia  Continue statins    Obesity  Exercise weight loss and lifestyle modification      Anemia  Due to CKD  ESA per protocol  Monitor CBCs and transfuse as needed    Hyperkalemia  Likely secondary to missing dialysis this is improved with dialysis    Thrombocytopenia  Chronic  Continue to monitor.    Peripheral neuropathy  Continue Neurontin.      GERD  Continue PPI           Discussed plan with patient and bedside RN    Nutrition: Consistent carbohydrate cardiac diet    DVT Prophylaxis: SCDs        Patient has BMI=Body mass index is 39.65 kg/m.  Diagnosis:  Obesity based on BMI criteria  Diet exercise weight loss and lifestyle modification    Code Status: Full code    Dispo: Home when medically stable    Family Contact: Patient's son       SUBJECTIVE     [name] states he feels better.  To previous days minimal dyspnea persists    MEDICATIONS     Current Facility-Administered Medications   Medication Dose Route Frequency    albuterol-ipratropium  3 mL Nebulization Q6H [place]    amLODIPine  10 mg Oral Daily    aspirin  81 mg Oral Daily    atorvastatin  40 mg Oral QHS    cefTRIAXone  2 g Intravenous Q24H    dextromethorphan polistirex ER  30 mg Oral Q12H SCH    gabapentin  400 mg Oral Q8H    hydrALAZINE  25 mg Oral Q8H [place]    insulin glargine  15 Units Subcutaneous QHS    insulin lispro  1-6 Units Subcutaneous QHS    insulin lispro  2-10 Units Subcutaneous TID AC    insulin lispro  5 Units Subcutaneous TID AC    pantoprazole  40 mg Oral QAM AC    sevelamer  800 mg Oral TID MEALS       PHYSICAL EXAM     Vitals:  02/06/21 1546   BP: 159/70   Pulse: 96   Resp: 17   Temp: 97.7 F (36.5 C)   SpO2: 93%       Temperature: Temp  Min: 97.4 F (36.3 C)  Max: 98.2 F (36.8 C)  Pulse: Pulse  Min: 80  Max: 97  Respiratory: Resp  Min: 12  Max: 22  Non-Invasive BP: BP  Min: 121/74  Max: 170/79  Pulse Oximetry SpO2  Min: 92 %  Max: 98 %    Intake and Output Summary (Last 24 hours) at Date Time    Intake/Output Summary (Last 24 hours) at 02/06/2021 1736  Last data filed at 02/06/2021 1130  Gross per 24 hour   Intake 1950 ml   Output 3200 ml   Net -1250 ml           GEN APPEARANCE: Obese female in mild respiratory distress A&OX3  HEENT: PERLA; EOMI; Conjunctiva Clear  NECK: Supple; No bruits  CVS: RRR, S1, S2; No M/G/R  LUNGS: Bilateral scattered rhonchi and wheezes  ABD: Soft; No TTP; + Normoactive BS  EXT: No edema; Pulses 2+ and intact  Skin exam: Warm and dry  NEURO: CN 2-12 intact; No Focal neurological deficits  CAP REFILL: Less than 3 seconds capillary refill  MENTAL  STATUS: Awake alert and oriented x3      LABS     Recent Labs   Lab 02/06/21  0853 02/05/21  0321 02/04/21  0227   WBC 4.87 4.58 5.11   RBC 3.40* 3.37* 3.55*   Hgb 9.8* 9.9* 10.5*   Hematocrit 33.1* 32.6* 34.4*   MCV 97.4* 96.7* 96.9*   Platelets 94* 90* 102*         Recent Labs   Lab 02/06/21  0853 02/05/21  0321 02/04/21  0227   Sodium 137 138 136   Potassium 4.7 4.2 5.4*   Chloride 100 101 98*   CO2 27 28 23    BUN 50.0* 28.0* 40.0*   Creatinine 6.2* 4.9* 6.9*   Glucose 365* 211* 484*   Calcium 7.9* 7.9* 8.1*         Recent Labs   Lab 02/06/21  0853 02/05/21  0321 02/04/21  0227   ALT 14 16 23    AST (SGOT) 10 12 23    Bilirubin, Total 0.3 0.4 0.5   Albumin 2.4* 2.3* 2.7*   Alkaline Phosphatase 185* 155* 172*         Recent Labs   Lab 02/04/21  0645 02/04/21  0455   hs Troponin-I 109.2* 98.2*   hs Troponin-I Delta 11.0 5.9               Microbiology Results (last 15 days)       Procedure Component Value Units Date/Time    COVID-19 (SARS-CoV-2) only ([facility]) asymptomatic admission [[id number]] Collected: 02/04/21 0227    Order Status: Completed Specimen: Nasopharyngeal Updated: 02/04/21 0259     Purpose of COVID testing Screening     SARS-CoV-2 Specimen Source Nasal Swab     SARS CoV 2 Overall Result Not Detected     Comment: __________________________________________________  -A result of "Detected" indicates POSITIVE for the    presence of SARS CoV-2 RNA  -A result of "Not Detected" indicates NEGATIVE for the    presence of SARS CoV-2 RNA  __________________________________________________________  Test performed using the Roche cobas Liat SARS-CoV-2 assay. This assay is  only for use under the Food and Drug Administration's Emergency Use  Authorization.  This is a real-time RT-PCR assay for the qualitative  detection of SARS-CoV-2 RNA. Viral nucleic acids may persist in vivo,  independent of viability. Detection of viral nucleic acid does not imply the  presence of infectious virus, or that virus nucleic acid is  the cause of  clinical symptoms. Negative results do not preclude SARS-CoV-2 infection and  should not be used as the sole basis for diagnosis, treatment or other  patient management decisions. Negative results must be combined with  clinical observations, patient history, and/or epidemiological information.  Invalid results may be due to inhibiting substances in the specimen and  recollection should occur. Please see Fact Sheets for patients and providers  located:  [email or web address].com/         Narrative:      o Collect and clearly label specimen type:  o PREFERRED-Upper respiratory specimen: One Nasal Swab in  Transport Media.  o Hand deliver to laboratory ASAP  Indication for testing->Extended care facility admission to  semi private room  Screening             RADIOLOGY     Chest AP Portable    Result Date: 02/04/2021  Mild pulmonary edema. Delaney Meigs, MD  02/04/2021 3:04 AM   Echo Results       Procedure Component Value Units Date/Time    Echocardiogram Adult Complete W Color Doppler Waveform [[id number]] Collected: 02/05/21 0902     Updated: 02/05/21 1254     MV Area (PHT) 4.58     RV Systolic Pressure 59.292     LA Volume Index (BP A-L) 0.032     MV E/e' (Average) 44.628     IVS Diastolic Thickness (2D) 6.38     LVID diastole (2D) 4.19     LVID systole (2D) 3.04     BP Mod LV Ejection Fraction 55.456     LA Dimension (2D) 4.4     AV Mean Gradient 4     AV Mean Gradient 4     AV Peak Velocity 142     AV Peak Velocity 141     AV Area (Cont Eq VTI) 1.953     AV Area (Cont Eq VTI) 1.87     Prox Ascending Aorta Diameter 3.3     MV E/A 1.66     MV E/A 1.7     RV Basal Diastolic Dimension 1.77     TAPSE 1.43     Aortic Valve Findings No aortic stenosis.     Aortic Valve Findings No aortic regurgitation.     Pulmonary Valve Findings Pulmonic valve is not adequately visualized.     Mitral Valve Findings No mitral stenosis.     Mitral Valve Findings Trace mitral regurgitation.     Tricuspid Valve  Findings No tricuspid stenosis.     Tricuspid Valve Findings Mild-Moderate tricuspid regurgitation.    Narrative:      Name:     JAYLENA HOLLOWAY  Age:     6 years  DOB:     1955-03-04  Gender:     Female  MRN:     [id number]  Wt:     238 lb  BSA:     2.24 m2  HR:     83 bpm  Systolic BP:     833 mmHg  Diastolic BP:     72 mmHg  Technical Quality:     Good  Exam Date/Time:     02/05/2021 9:02 AM  Study Site:  AH  Exam Room:     Bedside  Exam Type:     ECHOCARDIOGRAM ADULT COMPLETE W CLR/ DOPP WAVEFORM    Study Info  Indications      shortness of breath -  Procedure    Complete two-dimensional, color flow and spectral Doppler transthoracic  echocardiogram is performed.    Staff  Sonographer:     Steele Sizer  Ordering Physician:     Jamelle Rushing    62 in      History/Risk Factors  Hypertension:     Yes    Prior Cardiac Studies  Date of Prior Echo:     08-06-19      Summary    * Borderline right ventricular function.    * Normal left ventricular ejection fraction.    * No wall motion abnormalities seen.    * Mild-Moderate tricuspid regurgitation.    * By Simpson's, LV EF is 55%.      Findings  Left Ventricle    Concentric left ventricular remodeling. Normal left ventricular ejection  fraction. No wall motion abnormalities seen. By Simpson's, LV EF is 55%.  Right Ventricle    Normal right ventricular size. Borderline right ventricular function.      Left Atrium    Normal left atrial size.    Right Atrium    Normal right atrial size.      Aortic Valve    No aortic stenosis. No aortic regurgitation.    Pulmonary Valve    Pulmonic valve is not adequately visualized.    Mitral Valve    No mitral stenosis. Trace mitral regurgitation.    Tricuspid Valve    No tricuspid stenosis. Mild-Moderate tricuspid regurgitation.      Other Findings    Indeterminate diastolic function, but mitral in-flow and tissue Doppler data  are suggestive of elevated left atrial pressure.    Pericardium / Pleural Effusion    A  pleural effusion is  present.    Aorta    Normal size aortic root. Normal size proximal ascending aorta.      Measurements  2D Measurements  ----------------------------------------------------------------------  Name                                 Value        Normal  ----------------------------------------------------------------------    Parasternal 2D  ----------------------------------------------------------------------   IVS Diastolic Thickness  (2D)                               1.27 cm     0.60-0.90   LVID Diastole (2D)                 4.19 cm     3.66-4.40    LVIW Diastolic Thickness  (2D)                               1.18 cm     0.60-0.95   LVID Systole (2D)                  3.04 cm     2.20-3.50   LVOT Diameter                      2.10 cm  LA Dimension (2D)                  4.40 cm     2.70-3.80   Prox Asc Ao Diameter               3.30 cm     2.30-3.10     LV Ejection Fraction 2D  ----------------------------------------------------------------------  LV EF (BP MOD)                        55 %         54-74     Apical 2D Dimensions  ----------------------------------------------------------------------   RV Basal Diastolic  Dimension                          4.04 cm     2.50-4.10   LA Volume Index (BP A-L)        32.22 ml/m2       <=34.00   RA Area (4C)                     15.60 cm2       <=18.00   RV FAC (4C)                           16 %          >=35  M-mode Measurements  ----------------------------------------------------------------------  Name                                 Value        Normal  ----------------------------------------------------------------------    M-Mode  ----------------------------------------------------------------------  TAPSE                              1.43 cm        >=1.60  LVOT/Aortic Valve Doppler Measurements  ----------------------------------------------------------------------  Name                                 Value         Normal  ----------------------------------------------------------------------    LVOT Doppler  ----------------------------------------------------------------------  LVOT Peak Velocity                0.83 m/s                   AoV Doppler  ----------------------------------------------------------------------  AV Peak Velocity                  1.42 m/s                 AV Mean Gradient                    4 mmHg           <20   AV Area (Cont Eq VTI)             1.95 cm2        >=3.00   AV V1/V2 Ratio                        0.59  RVOT/Pulmonic Valve Doppler Measurements  ----------------------------------------------------------------------  Name  Value        Normal  ----------------------------------------------------------------------    PV Doppler  ----------------------------------------------------------------------  PV Peak Velocity                  0.81 m/s  Mitral Valve Measurements  ----------------------------------------------------------------------  Name                                 Value        Normal  ----------------------------------------------------------------------    MV Doppler  ----------------------------------------------------------------------  MV E Peak Velocity                1.27 m/s                 MV A Peak Velocity                0.77 m/s                 MV E/A                                1.66                   MV Annular TDI  ----------------------------------------------------------------------  MV Septal e' Velocity             0.06 m/s        >=0.08   MV E/e' (Septal)                     20.89        <=8.00   MV Lateral e' Velocity            0.05 m/s        >=0.10   MV E/e' (Lateral)                    25.40        <=8.00  Tricuspid Valve Measurements  ----------------------------------------------------------------------  Name                                 Value         Normal  ----------------------------------------------------------------------    TV Regurgitation Doppler  ----------------------------------------------------------------------  TR Peak Velocity                  2.29 m/s                 TR Peak Gradient                   21 mmHg                 RA Pressure                        15 mmHg           <=3   RV Systolic Pressure               36 mmHg           <36  Aorta / Venous Measurements  ----------------------------------------------------------------------  Name  Value        Normal  ----------------------------------------------------------------------    IVC/SVC  ----------------------------------------------------------------------  IVC Diameter (Exp 2D)              2.06 cm        <=2.10    IVC Diameter Percent Change  (2D)                                   7 %          >=50          Results for orders placed or performed during the hospital encounter of 10/20/20   CT Head WO Contrast    Narrative    CT HEAD WO CONTRAST:10/21/2020 2:42 AM     CLINICAL HISTORY: .Head trauma, minor, normal mental status ()  patient fell and struck her head:Head trauma, minor, normal mental  status (Age )    TECHNIQUE: Axial images were obtained from the skull base to vertex  without contrast. Study is somewhat suboptimal and there are multiple  regions of beam hardening artifact in the periphery of the frontal lobes  bilaterally which does reduce sensitivity for detection of small regions  of interest parenchymal or subarachnoid blood      Comparison 07/01/19  FINDINGS: The ventricles and sulci are moderately prominent but  consistent with patient's chronologic age. Decreased density is present  in the deep white matter of each hemisphere consistent with small vessel  disease change. No intracerebral mass or hemorrhage is present, and no  extra-axial collections are present.      Impression     No currently detectable acute intracranial hemorrhage  or  mass effect or edema. Mildly suboptimal study for detection of small  amounts of hemorrhage due to multiple regions of beam hardening artifact  in the periphery of the frontal lobes..    Note: Note that CT scanning at this site  utilizes multiple dose  reduction techniques including automatic exposure control, adjustment of  the MAA and/or KVP according to patient's size and use of iterative  reconstruction technique    Elyn Peers, MD   10/21/2020 8:16 AM   Results for orders placed or performed during the hospital encounter of 06/23/19   MRI Brain WO Contrast    Narrative    Clinical History:  right sided weakness and numbness     Examination:  Multiplanar MRI of the brain was performed without intravenous contrast,  using standard departmental protocol.    Comparison:  CT head dated 06/23/2019  MRI brain dated 03/19/2006    Findings:    Diffusion weighted images of the brain demonstrate no evidence of acute  infarction.  There is no evidence of acute intracranial hemorrhage,  extra-axial collection, mass effect, midline shift, herniation or  hydrocephalus.  The ventricles, sulci and cisterns appear age  appropriate.   There are mild periventricular white matter T2 FLAIR  hyperintensities, which most likely represent chronic microangiopathic  changes. There are no signal abnormalities on the susceptibility  weighted sequences.   The major vascular flow voids are present.    The  visualized paranasal sinuses and mastoid air cells are clear.   The  surrounding soft tissues and osseous structures are unremarkable.        Impression    No evidence of acute infarction, intracranial hemorrhage, mass effect  or hydrocephalus.    Gillermina Phy, MD   06/23/2019  8:04 PM       Signed,  Lisa Roca, MD  5:36 PM 02/06/2021

## 2021-02-06 NOTE — Plan of Care (Addendum)
A/O x 4. MAE, able to ambulate with cane/walker, unsteady. With mild left sided weakness. Encouraged to call for assistance as needed. Safety maintained. Bed alarm on.   SR with occasional PVCs. BP elevated. PRN IV Hydralazine 10 mg given x 1. Palpable pulses. Mild BLE edema. Denies chest pain. Afebrile at this time.   Not in respiratory distress. Clear, diminished breath sounds. O2 at 1 lpm/nc. O2 Sat=95%. Mild SOB on exertion.   Elevated BS. Bedtime BS=369 mg/dl. Due scheduled Lantus and SSI given. Will monitor for s/s of hypoglycemia.     BM x 1.  For repeat dialysis in am.  Refused needlestick for lab draw.     Problem: Safety  Goal: Patient will be free from injury during hospitalization  Outcome: Progressing  Flowsheets (Taken 02/06/2021 0159)  Patient will be free from injury during hospitalization:   Assess patient's risk for falls and implement fall prevention plan of care per policy   Use appropriate transfer methods   Include patient/ family/ care giver in decisions related to safety   Assess for patients risk for elopement and implement Elopement Risk Plan per policy   Provide alternative method of communication if needed (communication boards, writing)   Ensure appropriate safety devices are available at the bedside   Hourly rounding   Provide and maintain safe environment  Goal: Patient will be free from infection during hospitalization  Outcome: Progressing     Problem: Pain  Goal: Pain at adequate level as identified by patient  Outcome: Progressing     Problem: Renal Instability  Goal: Fluid and electrolyte balance are achieved/maintained  Outcome: Progressing  Flowsheets (Taken 02/06/2021 0159)  Fluid and electrolyte balance are achieved/maintained:   Monitor intake and output every shift   Monitor daily weight   Observe for seizure activity and initiate seizure precautions if indicated   Monitor for muscle weakness   Monitor/assess lab values and report abnormal values   Assess for  confusion/personality changes   Follow fluid restrictions/IV/PO parameters   Provide adequate hydration   Assess and reassess fluid and electrolyte status   Observe for cardiac arrhythmias     Problem: Patient Receiving Advanced Renal Therapies  Goal: Therapy access site remains intact  Outcome: Progressing  Flowsheets (Taken 02/06/2021 0159)  Therapy access site remains intact: Assess therapy access site     Problem: Compromised Tissue integrity  Goal: Damaged tissue is healing and protected  Outcome: Progressing     Problem: Hemodynamic Status: Cardiac  Goal: Stable vital signs and fluid balance  Outcome: Progressing  Flowsheets (Taken 02/06/2021 0200)  Stable vital signs and fluid balance:   Monitor/assess vital signs and telemetry per unit protocol   Assess signs and symptoms associated with cardiac rhythm changes   Monitor lab values   Monitor for leg swelling/edema and report to LIP if abnormal   Monitor intake/output per unit protocol and/or LIP order   Weigh on admission and record weight daily

## 2021-02-06 NOTE — Progress Notes (Signed)
Patient alert, oriented x 3. Denies any pain. Vital signs within normal range. For extra UF today due to volume overload. Will monitor closely on dialysis.     02/06/21 0915   Bedside Nurse Communication   Name of bedside RN - pre dialysis Edwena Felty   Treatment Initiation- With Dialysis Precautions   Time Out/Safety Check Completed Yes   Consent for HD signed for this hospitalization (Date) 02/04/21   Consent for HD signed for this hospitalization (Time) 1450   Preferred language No   Blood Consent Verified N/A   Dialysis Precautions All Connections Secured;Saline Line Double Clamped;Venous Parameters Set;Arterial Parameters Set;Air Foam Detecctor Engaged   Dialysis Treatment Type Acute Room   Special Considerations Extra UF   Is patient diabetic? Yes   RO/Hemodialysis Architectural technologist   Is Total Chlorine less than 0.1 ppm? Yes   Orignial Total Chlorine Testing Time 0845   RO/Hemodialysis Facilities manager Number 5    Machine Serial Number [id number]   RO # 26   RO Serial # [id number]   Water Hardness NA   pH 7.2   Pressure Test Verified Yes   Alarms Verified Passed   Machine Temperature 97.7 F (36.5 C)   Alarms Verified Yes   Na+ mEq (Machine) 138 mEq   Bicarb mEq (Machine) 35 mEq   Hemodialysis Conductivity (Machine) 13.8   Hemodialysis Conductivity (Meter) 13.8   Dialyzer Lot Number [id number]   Tubing Lot Number [id number]   RO Machine Log Completed Yes   Hepatitis Status   HBsAg (Antigen) Result Negative   HBsAg Date Drawn 02/04/21   HBsAg Repeat Draw Due Date 03/04/21   Dialysis Weight   Pre-Treatment Weight (Kg) 99.6   Scale Type ICU Bed Scale   Vitals   Temp 98 F (36.7 C)   Heart Rate 85   Resp Rate 20   BP 139/68   SpO2 92 %   O2 Device None (Room air)   Assessment   Mental Status Alert;Oriented;Cooperative   Cardiac (WDL) WDL   Cardiac Regularity Regular   Cardiac Symptoms None   Cardiac Rhythm Normal Sinus Rhythm   Respiratory  WDL   Respiratory Pattern Regular   Bilateral Breath Sounds  Clear;Diminished   Edema  X   Generalized Edema Non Pitting Edema   Facial Non Pitting Edema   Sacral UTA   RLE Edema Non Pitting Edema   LLE Edema Non Pitting Edema   General Skin Color Appropriate for ethnicity   Skin Condition/Temp Warm;Dry   Gastrointestinal (WDL) WDL   Abdomen Inspection Soft;Nondistended   GI Symptoms None   Mobility Ambulatory with Assistance   Graft/Fistula Left   No placement date or time found.   Access Type: Arteriovenous Fistula  Orientation: Left  Graft/Fistula Location: Upper Arm   Fistula/ Graft Assessment Abnormalities WDL   Needle Size 15 Gauge   Pain Assessment   Charting Type Assessment   Pain Scale Used Numeric Scale (0-10)   Numeric Pain Scale   Pain Score 0   POSS Score 1   Hemodialysis Comments   Pre-Hemodialysis Comments time out done   Hemodialysis History Information   Outpatient Dialysis Unit [facility]   Chronic Dialysis Patient Yes   Outpatient Nephrologist Dr Conley Canal

## 2021-02-06 NOTE — Progress Notes (Signed)
[facility] PROGRESS NOTE  Aaron Edelman, x [phone] [facility] Spectralink)      Date Time: 02/06/21 3:28 PM  Patient Name: [name]  Attending Physician: Lisa Roca, MD    CC: follow-up ESRD, volume overload     Assessment:   ESRD on HD MWF at [facility] - followed by Dr. Randel Pigg  - missed HD on 1/11 due to diarrhea/not feeling well   Accelerated hypertension due to volume overload in the setting of missed HD - resolved   Volume overload - significantly improved   Anemia in CKD   Hyperphosphatemia - controlled  Secondary hyperparathyroidism    Recommendations:     S/p UF today with 3.1L net UF  Routine HD tomorrow  Stable for discharge from renal perspective tomorrow after HD       Case discussed with: no one     Verl Blalock, MD, MD  [facility]  [phone] (office)  X [phone] ([facility])      Subjective: comfortably resting     Review of Systems:   Deferred today    Physical Exam:     Vitals:    02/06/21 1115 02/06/21 1120 02/06/21 1130 02/06/21 1350   BP: 127/60 146/77 149/68 133/88   Pulse: 87 90 90    Resp: 20 20 20     Temp:  98.2 F (36.8 C) 98.2 F (36.8 C)    TempSrc:       SpO2:   95%    Weight:       Height:           Intake and Output Summary (Last 24 hours) at Date Time    Intake/Output Summary (Last 24 hours) at 02/06/2021 1528  Last data filed at 02/06/2021 1130  Gross per 24 hour   Intake 1950 ml   Output 3200 ml   Net -1250 ml         General: comfortably resting   Cardiovascular: regular rate and rhythm  Lungs: few crackles noted, no wheezing, bilateral air entry  Abdomen: soft  Extremities: trace edema    LUE AVF    Meds:      Scheduled Meds: PRN Meds:    albuterol-ipratropium, 3 mL, Nebulization, Q6H Emanuel  amLODIPine, 10 mg, Oral, Daily  aspirin, 81 mg, Oral, Daily  atorvastatin, 40 mg, Oral, QHS  cefTRIAXone, 2 g, Intravenous, Q24H  dextromethorphan polistirex ER, 30 mg, Oral, Q12H SCH  gabapentin, 400 mg, Oral, Q8H  hydrALAZINE, 25 mg, Oral, Q8H  SCH  insulin glargine, 15 Units, Subcutaneous, QHS  insulin lispro, 1-6 Units, Subcutaneous, QHS  insulin lispro, 2-10 Units, Subcutaneous, TID AC  insulin lispro, 5 Units, Subcutaneous, TID AC  pantoprazole, 40 mg, Oral, QAM AC  sevelamer, 800 mg, Oral, BID Meals        Continuous Infusions:   albuterol, 5 mg, Q6H PRN  dextrose, 15 g of glucose, PRN   And  dextrose, 12.5 g, PRN   And  dextrose, 12.5 g, PRN   And  glucagon (rDNA), 1 mg, PRN  guaiFENesin, 200 mg, Q4H PRN  hydrALAZINE, 10 mg, Q6H PRN  naloxone, 0.4 mg, PRN  oxyCODONE, 5 mg, Q6H PRN            Labs:     Recent Labs   Lab 02/06/21  0853 02/05/21  0321 02/04/21  0227   WBC 4.87 4.58 5.11   Hgb 9.8* 9.9* 10.5*   Hematocrit 33.1* 32.6* 34.4*   Platelets  94* 90* 102*       Recent Labs   Lab 02/06/21  0853 02/05/21  0321 02/04/21  0227   Sodium 137 138 136   Potassium 4.7 4.2 5.4*   Chloride 100 101 98*   CO2 27 28 23    BUN 50.0* 28.0* 40.0*   Creatinine 6.2* 4.9* 6.9*   Calcium 7.9* 7.9* 8.1*   Albumin 2.4* 2.3* 2.7*   Glucose 365* 211* 484*   EGFR 8.2 10.7 7.2         Signed by: Verl Blalock, MD, MD

## 2021-02-07 LAB — CBC AND DIFFERENTIAL
Absolute NRBC: 0 10*3/uL (ref 0.00–0.00)
Basophils Absolute Automated: 0.02 10*3/uL (ref 0.00–0.08)
Basophils Automated: 0.4 %
Eosinophils Absolute Automated: 0.13 10*3/uL (ref 0.00–0.44)
Eosinophils Automated: 2.4 %
Hematocrit: 33.3 % — ABNORMAL LOW (ref 34.7–43.7)
Hgb: 9.9 g/dL — ABNORMAL LOW (ref 11.4–14.8)
Immature Granulocytes Absolute: 0.03 10*3/uL (ref 0.00–0.07)
Immature Granulocytes: 0.6 %
Instrument Absolute Neutrophil Count: 3.46 10*3/uL (ref 1.10–6.33)
Lymphocytes Absolute Automated: 1.18 10*3/uL (ref 0.42–3.22)
Lymphocytes Automated: 21.9 %
MCH: 29 pg (ref 25.1–33.5)
MCHC: 29.7 g/dL — ABNORMAL LOW (ref 31.5–35.8)
MCV: 97.7 fL — ABNORMAL HIGH (ref 78.0–96.0)
MPV: 11.9 fL (ref 8.9–12.5)
Monocytes Absolute Automated: 0.56 10*3/uL (ref 0.21–0.85)
Monocytes: 10.4 %
Neutrophils Absolute: 3.46 10*3/uL (ref 1.10–6.33)
Neutrophils: 64.3 %
Nucleated RBC: 0 /100 WBC (ref 0.0–0.0)
Platelets: 100 10*3/uL — ABNORMAL LOW (ref 142–346)
RBC: 3.41 10*6/uL — ABNORMAL LOW (ref 3.90–5.10)
RDW: 14 % (ref 11–15)
WBC: 5.38 10*3/uL (ref 3.10–9.50)

## 2021-02-07 LAB — COMPREHENSIVE METABOLIC PANEL
ALT: 12 U/L (ref 0–55)
AST (SGOT): 8 U/L (ref 5–41)
Albumin/Globulin Ratio: 0.7 — ABNORMAL LOW (ref 0.9–2.2)
Albumin: 2.4 g/dL — ABNORMAL LOW (ref 3.5–5.0)
Alkaline Phosphatase: 194 U/L — ABNORMAL HIGH (ref 37–117)
Anion Gap: 14 (ref 5.0–15.0)
BUN: 70 mg/dL — ABNORMAL HIGH (ref 7.0–21.0)
Bilirubin, Total: 0.3 mg/dL (ref 0.2–1.2)
CO2: 24 mEq/L (ref 17–29)
Calcium: 8 mg/dL — ABNORMAL LOW (ref 8.5–10.5)
Chloride: 99 mEq/L (ref 99–111)
Creatinine: 6.7 mg/dL — ABNORMAL HIGH (ref 0.4–1.0)
Globulin: 3.3 g/dL (ref 2.0–3.6)
Glucose: 314 mg/dL — ABNORMAL HIGH (ref 70–100)
Potassium: 5.1 mEq/L (ref 3.5–5.3)
Protein, Total: 5.7 g/dL — ABNORMAL LOW (ref 6.0–8.3)
Sodium: 137 mEq/L (ref 135–145)

## 2021-02-07 LAB — GLUCOSE WHOLE BLOOD - POCT
Whole Blood Glucose POCT: 279 mg/dL — ABNORMAL HIGH (ref 70–100)
Whole Blood Glucose POCT: 220 mg/dL — ABNORMAL HIGH (ref 70–100)
Whole Blood Glucose POCT: 337 mg/dL — ABNORMAL HIGH (ref 70–100)
Whole Blood Glucose POCT: 327 mg/dL — ABNORMAL HIGH (ref 70–100)
Whole Blood Glucose POCT: 150 mg/dL — ABNORMAL HIGH (ref 70–100)

## 2021-02-07 LAB — BETA HCG, TUMOR, QUANTITATIVE: TUMOR MARKER BHCG QUANT: 10 IU/L

## 2021-02-07 LAB — GFR: EGFR: 7.5

## 2021-02-07 MED ORDER — INSULIN LISPRO 100 UNIT/ML SOLN (WRAP)
10.0000 [IU] | Freq: Three times a day (TID) | Status: DC
Start: 2021-02-07 — End: 2021-02-08
  Administered 2021-02-07 – 2021-02-08 (×4): 10 [IU] via SUBCUTANEOUS
  Filled 2021-02-07 (×3): qty 30

## 2021-02-07 MED ORDER — SODIUM CHLORIDE 0.9 % IV BOLUS
100.0000 mL | INTRAVENOUS | Status: AC | PRN
Start: 2021-02-07 — End: 2021-02-07

## 2021-02-07 MED ORDER — INSULIN GLARGINE 100 UNIT/ML SC SOLN
25.0000 [IU] | Freq: Every evening | SUBCUTANEOUS | Status: DC
Start: 2021-02-07 — End: 2021-02-08
  Administered 2021-02-07: 25 [IU] via SUBCUTANEOUS
  Filled 2021-02-07: qty 25

## 2021-02-07 MED ORDER — SODIUM CHLORIDE 0.9 % IV BOLUS
250.0000 mL | INTRAVENOUS | Status: AC | PRN
Start: 2021-02-07 — End: 2021-02-07

## 2021-02-07 MED ORDER — ALBUMIN HUMAN 25 % IV SOLN
INTRAVENOUS | Status: AC
Start: 2021-02-07 — End: 2021-02-07
  Administered 2021-02-07: 25 g via INTRAVENOUS
  Filled 2021-02-07: qty 100

## 2021-02-07 MED ORDER — ALBUMIN HUMAN 25 % IV SOLN
100.0000 mL | INTRAVENOUS | Status: AC | PRN
Start: 2021-02-07 — End: 2021-02-07

## 2021-02-07 MED ORDER — ALBUTEROL-IPRATROPIUM 2.5-0.5 (3) MG/3ML IN SOLN
3.0000 mL | Freq: Four times a day (QID) | RESPIRATORY_TRACT | Status: DC
Start: 2021-02-07 — End: 2021-02-08
  Administered 2021-02-07 – 2021-02-08 (×4): 3 mL via RESPIRATORY_TRACT
  Filled 2021-02-07 (×3): qty 3

## 2021-02-07 NOTE — Progress Notes (Signed)
CM refer to HD care coordinator [facility] for HD resume home once discharge.     Assign CM should f/u with HD coordinator and patient DCP. Home     [name] ,[place]  Social Worker Case Management 1  [phone]

## 2021-02-07 NOTE — Progress Notes (Signed)
[facility] PROGRESS NOTE  Aaron Edelman, x [phone] [facility] Spectralink)      Date Time: 02/07/21 4:08 PM  Patient Name: [name]  Attending Physician: Lisa Roca, MD    CC: follow-up ESRD, volume overload     Assessment:   ESRD on HD MWF at [facility] - followed by Dr. Randel Pigg  - missed HD on 1/11 due to diarrhea/not feeling well   Accelerated hypertension due to volume overload in the setting of missed HD - resolved   Volume overload - significantly improved   Anemia in CKD   Hyperphosphatemia - controlled  Secondary hyperparathyroidism    Recommendations:     S/p HD today with 3.1L net UF  Stable for discharge from renal perspective  Next HD on Wednesday     Case discussed with: HD RN     Verl Blalock, MD, MD  [facility]  [phone] (office)  X [phone] ([place])      Subjective: comfortably resting     Review of Systems:   Deferred today    Physical Exam:     Vitals:    02/07/21 1330 02/07/21 1358 02/07/21 1505 02/07/21 1527   BP: 139/65 155/65 153/74 153/74   Pulse: 94 91  (!) 101   Resp: 18 18  18    Temp:  98.1 F (36.7 C)  98.5 F (36.9 C)   TempSrc:    Oral   SpO2: 100% 100%  93%   Weight:       Height:           Intake and Output Summary (Last 24 hours) at Date Time    Intake/Output Summary (Last 24 hours) at 02/07/2021 1608  Last data filed at 02/07/2021 1358  Gross per 24 hour   Intake 500 ml   Output 3100 ml   Net -2600 ml         General: comfortably resting   Cardiovascular: regular rate and rhythm  Lungs: few crackles noted, no wheezing, bilateral air entry  Abdomen: soft  Extremities: trace edema    LUE AVF    Meds:      Scheduled Meds: PRN Meds:    albuterol-ipratropium, 3 mL, Nebulization, Q6H WA  amLODIPine, 10 mg, Oral, Daily  aspirin, 81 mg, Oral, Daily  atorvastatin, 40 mg, Oral, QHS  cefTRIAXone, 2 g, Intravenous, Q24H  dextromethorphan polistirex ER, 30 mg, Oral, Q12H SCH  gabapentin, 400 mg, Oral, Q8H  hydrALAZINE, 25 mg, Oral, Q8H  SCH  insulin glargine, 15 Units, Subcutaneous, QHS  insulin lispro, 1-6 Units, Subcutaneous, QHS  insulin lispro, 10 Units, Subcutaneous, TID AC  insulin lispro, 2-10 Units, Subcutaneous, TID AC  pantoprazole, 40 mg, Oral, QAM AC  sevelamer, 800 mg, Oral, TID MEALS        Continuous Infusions:   albumin human, 100 mL, PRN  albuterol, 5 mg, Q6H PRN  dextrose, 15 g of glucose, PRN   And  dextrose, 12.5 g, PRN   And  dextrose, 12.5 g, PRN   And  glucagon (rDNA), 1 mg, PRN  guaiFENesin, 200 mg, Q4H PRN  hydrALAZINE, 10 mg, Q6H PRN  naloxone, 0.4 mg, PRN  oxyCODONE, 5 mg, Q6H PRN            Labs:     Recent Labs   Lab 02/07/21  0346 02/06/21  0853 02/05/21  0321   WBC 5.38 4.87 4.58   Hgb 9.9* 9.8* 9.9*   Hematocrit 33.3* 33.1* 32.6*  Platelets 100* 94* 90*       Recent Labs   Lab 02/07/21  0346 02/06/21  0853 02/05/21  0321   Sodium 137 137 138   Potassium 5.1 4.7 4.2   Chloride 99 100 101   CO2 24 27 28    BUN 70.0* 50.0* 28.0*   Creatinine 6.7* 6.2* 4.9*   Calcium 8.0* 7.9* 7.9*   Albumin 2.4* 2.4* 2.3*   Glucose 314* 365* 211*   EGFR 7.5 8.2 10.7         Signed by: Verl Blalock, MD, MD

## 2021-02-07 NOTE — Plan of Care (Signed)
Problem: Renal Instability  Goal: Fluid and electrolyte balance are achieved/maintained  Outcome: Progressing  Flowsheets (Taken 02/07/2021 1005)  Fluid and electrolyte balance are achieved/maintained:   Monitor/assess lab values and report abnormal values   Monitor daily weight   Assess for confusion/personality changes   Observe for cardiac arrhythmias     Problem: Patient Receiving Advanced Renal Therapies  Goal: Therapy access site remains intact  Outcome: Progressing  Flowsheets (Taken 02/07/2021 1005)  Therapy access site remains intact: Assess therapy access site

## 2021-02-07 NOTE — Plan of Care (Addendum)
Neuro: [name]&Ox4  Resp: On room air, diminished breath sounds  CV: NSR on tele, cont pulse ox; VSS WDL   GI: No nausea/vomiting and tolerating consistent carb/heart healthy diet  GU: External cath  M: Active in all extremities  S: WDL   IV: 20g right arm, left AV fistula  DVT PP: Pt ambulates in room  Safety: Fall mat in place, bedside table and call bell within reach. Bedside rails x3 up. Diane instructed to call when needing assistance. Pt verbalized understanding. Purposeful rounding by the nurse and tech    Shift events:  Encourage ambulation; encourage use of IS.  PRN Robitussin given once   Dialysis 3.1 L removed  Diane has discharge orders, awaiting case management for home HD            Problem: Safety  Goal: Diane will be free from injury during hospitalization  Outcome: Progressing  Flowsheets (Taken 02/07/2021 1123)  Diane will be free from injury during hospitalization:   Assess Diane's risk for falls and implement fall prevention plan of care per policy   Provide and maintain safe environment   Use appropriate transfer methods   Ensure appropriate safety devices are available at the bedside   Include Diane/ family/ care giver in decisions related to safety   Assess for patients risk for elopement and implement Elopement Risk Plan per policy   Hourly rounding     Problem: Renal Instability  Goal: Fluid and electrolyte balance are achieved/maintained  Outcome: Progressing  Flowsheets (Taken 02/07/2021 1123)  Fluid and electrolyte balance are achieved/maintained:   Monitor intake and output every shift   Monitor/assess lab values and report abnormal values   Provide adequate hydration   Monitor daily weight   Assess and reassess fluid and electrolyte status   Observe for seizure activity and initiate seizure precautions if indicated   Assess for confusion/personality changes   Observe for cardiac arrhythmias   Monitor for muscle weakness  Goal: Free from infection  Outcome: Progressing      Problem: Diane Receiving Advanced Renal Therapies  Goal: Therapy access site remains intact  Outcome: Progressing  Flowsheets (Taken 02/07/2021 1123)  Therapy access site remains intact: Assess therapy access site     Problem: Hemodynamic Status: Cardiac  Goal: Stable vital signs and fluid balance  Outcome: Progressing  Flowsheets (Taken 02/07/2021 1123)  Stable vital signs and fluid balance:   Monitor/assess vital signs and telemetry per unit protocol   Weigh on admission and record weight daily   Monitor intake/output per unit protocol and/or LIP order   Assess signs and symptoms associated with cardiac rhythm changes   Monitor for leg swelling/edema and report to LIP if abnormal   Monitor lab values

## 2021-02-07 NOTE — Respiratory Progress Note (Signed)
Respiratory Therapy                              Patient Re-Assessment Note/Protocol Order Changes    Patient has been assessed and re-evaluated for the follow therapies:       Respiratory Orders   (From admission, onward)                 Start     Ordered    02/06/21 2036  Isolation Patient (RT Use only)  Every 6 hours scheduled (RT)       02/06/21 2035    02/05/21 2000  Nebulizer treatment intermittent  Every 6 hours scheduled (RT)      Comments:   All Adult patients ordered for Respiratory Therapy, i.e., inhaled meds, secretion clearance/lung expansion or Oxygen greater than 5 liters/min will be evaluated by a Respiratory Therapist and assessed per Respiratory Therapy Patient Driven Protocol.  Initial assessment and changes made per protocol can be found in the progress note section of the patient chart.    02/05/21 1839    02/05/21 2000  Resp Re-Assess Adult (RT Use Only)  2 times daily (RT)       02/05/21 1839                  IP Meds - Nasal and Inhaled (From admission, onward)      Start     Stop Status Route Frequency Ordered    02/05/21 2000  albuterol-ipratropium (DUO-NEB) 2.5-0.5(3) mg/3 mL nebulizer 3 mL         -- Dispensed NEBULIZATION RT - Every 6 hours scheduled 02/05/21 1736    02/04/21 1105  albuterol (PROVENTIL) (2.5 MG/3ML) 0.083% nebulizer solution 5 mg         -- Dispensed NEBULIZATION RT - Every 6 hours as needed 02/04/21 1108                 Current Criteria For Therapy  Secretion Clearance: None indicated  Lung Expansion: None indicated  Medications: Hx of COPD, Asthma, Bronchitis or other documented RAD    Expected Outcomes          Meds: Decreased freq of symptoms    Outcomes Met        Meds: No       Reassessment Recommendations  Recommendations: Decrease current treatment plan    Patient's orders have been modified as follows per RT Patient Driven Protocol.    Neb tx plan changed to q6wa    If any questions, please contact the  Respiratory Therapist assigned to this patient    Thank you

## 2021-02-07 NOTE — Progress Notes (Signed)
Patient is active with [facility], [phone], for _SN PT OT_ and will need orders to resume home care prior to hospital discharge.

## 2021-02-07 NOTE — Plan of Care (Signed)
A/O x 4. MAE, able to ambulate with assistance/cane Unsteady gait. Encouraged to call for assistance as needed. Safety maintained. Bed alarm on.   SR. BP mildly elevated. Palpable pulses. Trace generalized edema.Denies chest pain. Afebrile at this time.   Not in respiratory distress. Clear, diminished breath sounds. O2 Sat=92-93%. Mild SOB on exertion. Noted to be desatting to the 80s while asleep, placed on O2 at 1 lpm/nc.   For repeat HD in am.   For possible [place] after HD.     Problem: Safety  Goal: Patient will be free from injury during hospitalization  Outcome: Progressing  Flowsheets (Taken 02/06/2021 0159)  Patient will be free from injury during hospitalization:   Assess patient's risk for falls and implement fall prevention plan of care per policy   Use appropriate transfer methods   Include patient/ family/ care giver in decisions related to safety   Assess for patients risk for elopement and implement Elopement Risk Plan per policy   Provide alternative method of communication if needed (communication boards, writing)   Ensure appropriate safety devices are available at the bedside   Hourly rounding   Provide and maintain safe environment     Problem: Renal Instability  Goal: Fluid and electrolyte balance are achieved/maintained  Outcome: Progressing  Flowsheets (Taken 02/07/2021 0153)  Fluid and electrolyte balance are achieved/maintained:   Monitor intake and output every shift   Monitor daily weight   Observe for seizure activity and initiate seizure precautions if indicated   Monitor for muscle weakness   Follow fluid restrictions/IV/PO parameters   Assess for confusion/personality changes   Monitor/assess lab values and report abnormal values   Assess and reassess fluid and electrolyte status   Observe for cardiac arrhythmias   Provide adequate hydration     Problem: Patient Receiving Advanced Renal Therapies  Goal: Therapy access site remains intact  Outcome: Progressing  Flowsheets (Taken 02/07/2021  0153)  Therapy access site remains intact: Assess therapy access site     Problem: Hemodynamic Status: Cardiac  Goal: Stable vital signs and fluid balance  Outcome: Progressing  Flowsheets (Taken 02/06/2021 0200)  Stable vital signs and fluid balance:   Monitor/assess vital signs and telemetry per unit protocol   Assess signs and symptoms associated with cardiac rhythm changes   Monitor lab values   Monitor for leg swelling/edema and report to LIP if abnormal   Monitor intake/output per unit protocol and/or LIP order   Weigh on admission and record weight daily     Problem: Compromised Tissue integrity  Goal: Damaged tissue is healing and protected  Outcome: Progressing

## 2021-02-07 NOTE — Progress Notes (Signed)
Arrived in HD unit, pt is lethargic. Pt denied pain and sob.  LAVG access is optimally working.Report received from primary RN pre-dialysis. Timeouts and safety checks done. Will closely monitor the pt.       02/07/21 0945   Bedside Nurse Communication   Name of bedside RN - pre dialysis Nemiah Commander, RN   Treatment Initiation- With Dialysis Precautions   Time Out/Safety Check Completed Yes   Consent for HD signed for this hospitalization (Date) 02/04/21   Consent for HD signed for this hospitalization (Time) 1450   Blood Consent Verified N/A   Dialysis Precautions All Connections Secured;Saline Line Double Clamped;Venous Parameters Set;Arterial Parameters Set;Air Foam Detecctor Engaged   Dialysis Treatment Type Acute Room   Is patient diabetic? Yes   RO/Hemodialysis Architectural technologist   Is Total Chlorine less than 0.1 ppm? Yes   Orignial Total Chlorine Testing Time 0835   At 4 Hour Total Chlorine Testing Time 1235   RO/Hemodialysis Facilities manager Number 5    Machine Serial Number [id number]   RO # 26   RO Serial # [id number]   pH 7.2   Pressure Test Verified Yes   Alarms Verified Passed   Machine Temperature 98.6 F (37 C)   Alarms Verified Yes   Na+ mEq (Machine) 138 mEq   Bicarb mEq (Machine) 35 mEq   Hemodialysis Conductivity (Machine) 13.8   Hemodialysis Conductivity (Meter) 13.8   Dialyzer Lot Number [id number]   Tubing Lot Number [id number]   RO Machine Log Completed Yes   Hepatitis Status   HBsAg (Antigen) Result Negative   HBsAg Date Drawn 02/04/21   HBsAg Repeat Draw Due Date 03/04/21   Dialysis Weight   Pre-Treatment Weight (Kg) 103.5   Scale Type ICU Bed Scale   Vitals   Heart Rate 96   Resp Rate 18   BP 141/64   SpO2 92 %   O2 Device None (Room air)   Assessment   Mental Status Lethargic   Cardiac Regularity Regular   Cardiac Rhythm Normal Sinus Rhythm   Respiratory Pattern Regular   Bilateral Breath Sounds Diminished   R Breath Sounds Diminished   L Breath Sounds Diminished   RLE Edema +1    LLE Edema +1   General Skin Color Appropriate for ethnicity   Skin Condition/Temp Warm;Dry   Abdomen Inspection Soft   Mobility Bed   Graft/Fistula Left   No placement date or time found.   Access Type: Arteriovenous Fistula  Orientation: Left  Graft/Fistula Location: Upper Arm   Fistula/ Graft Assessment Abnormalities WDL   Pain Assessment   Charting Type Assessment   Pain Scale Used Numeric Scale (0-10)   Numeric Pain Scale   Pain Score 0   POSS Score 1   Hemodialysis Comments   Pre-Hemodialysis Comments Timeout and safety checks one

## 2021-02-07 NOTE — Progress Notes (Signed)
Pt is alert and oriented. Pt denied pain and sob. No other complain given. LAVG  access is optimally working post dialysis.  No bleeding on access site post tx. Pt's BP soft during tx, albumin 25% given as ordered and UF lowered.  Hemodialysis ended and pt. able to finish 3.5 hrs tx.  Able to removed Net Fluid of  3.1L.  Reports were given to Primary nurse.       02/07/21 1358   Treatment Summary   Time Off Machine 1330   Duration of Treatment (Hours) 3.5   Treatment Type 2:1   Dialyzer Clearance Moderately streaked   Fluid Volume Off (mL) 3600   Prime Volume (mL) 200   Rinseback Volume (mL) 200   Fluid Given: Normal Saline (mL) 0   Fluid Given: PRBC  0 mL   Fluid Given: Albumin (mL) 100   Fluid Given: Other (mL) 0   Total Fluid Given 500   Hemodialysis Net Fluid Removed 3100   Post Treatment Assessment   Post-Treatment Weight (Kg)   (3.1L.)   Patient Response to Treatment fairly tolerated   Additional Dialyzer Used 0   Graft/Fistula Left   No placement date or time found.   Access Type: Arteriovenous Fistula  Orientation: Left  Graft/Fistula Location: Upper Arm   Fistula/ Graft Assessment Abnormalities WDL   Needle Size 16 Gauge   Cannulation Sites held Arterial (min) 8   Cannulation Sites held Venous (min) 8   Hemostasis Achieved Yes   Vitals   Temp 98.1 F (36.7 C)   Heart Rate 91   Resp Rate 18   BP 155/65   SpO2 100 %   O2 Device None (Room air)   Assessment   Mental Status Alert;Oriented   Cardiac Regularity Regular   Cardiac Rhythm Normal Sinus Rhythm   Respiratory Pattern Regular   Bilateral Breath Sounds Diminished   R Breath Sounds Diminished   L Breath Sounds Diminished   RLE Edema Non Pitting Edema   LLE Edema Non Pitting Edema   General Skin Color Appropriate for ethnicity   Skin Condition/Temp Warm;Dry   Abdomen Inspection Soft   GI Symptoms None   Mobility Bed   Pain Assessment   Charting Type Assessment   Pain Scale Used Numeric Scale (0-10)   Numeric Pain Scale   Pain Score 0   POSS Score 1    Education   Person taught Patient   Knowledge basis Substantial   Topics taught Fluid Management   Teaching Tools Explain   Reponse Verbalizes Understanding   Bedside Nurse Communication   Name of bedside RN - post dialysis Nemiah Commander, RN.

## 2021-02-07 NOTE — Progress Notes (Signed)
[facility]  PROGRESS NOTE      Patient: [name]  Date: 02/07/2021   LOS: 0 Days  Admission Date: 02/04/2021   MRN: [id number]  Attending: Lisa Roca, MD  Please contact me on SecureChat       ASSESSMENT/PLAN     [name] is a 66 y.o. female admitted with Hypertensive emergency    Interval Summary:     Patient Active Hospital Problem List:  Hypertensive emergency (02/04/2021)     Likely secondary to fluid overload and missing dialysis and questionable compliance with medications  Blood pressure is improved with dialysis ultrafiltration and resumption of antihypertensive medications    End-stage renal disease on dialysis  Continue dialysis per nephrology    Flash pulmonary edema/fluid overload  Secondary to hypertensive urgency and missing dialysis  Clinically improved--dyspnea has improved with successive dialysis and ultrafiltration  Plan to dialyze again in a.m. and hopefully discharge after that    Acute respiratory failure with hypoxia  Secondary to fluid overload/acute bronchitis  This is resolved patient currently on room air    Acute bronchitis  Patient has developed a cough productive of brownish sputum  Continue empiric antibiotics to regimen  Aggressive pulmonary toileting.    Diabetes mellitus with hyperglycemia  Continue sliding scale coverage and Lantus  Will adjust  Doses based on blood sugar readings.    COPD by history  Inhalers      Hyperlipidemia  Continue statins    Obesity  Exercise weight loss and lifestyle modification      Anemia  Due to CKD  ESA per protocol  Monitor CBCs and transfuse as needed    Hyperkalemia  Likely secondary to missing dialysis this is improved with dialysis    Thrombocytopenia  Chronic  Continue to monitor.    Peripheral neuropathy  Continue Neurontin.      GERD  Continue PPI           Discussed plan with patient and bedside RN    Nutrition: Consistent carbohydrate cardiac diet    DVT Prophylaxis: SCDs        Patient has BMI=Body mass index  is 39.72 kg/m.  Diagnosis: Obesity based on BMI criteria  Diet exercise weight loss and lifestyle modification    Code Status: Full code    Dispo: Patient is stable for discharge however awaiting arrangement of home dialysis.    Family Contact: Patient's son       SUBJECTIVE     [name] states he feels better compared to previous days    MEDICATIONS     Current Facility-Administered Medications   Medication Dose Route Frequency    albuterol-ipratropium  3 mL Nebulization Q6H WA    amLODIPine  10 mg Oral Daily    aspirin  81 mg Oral Daily    atorvastatin  40 mg Oral QHS    cefTRIAXone  2 g Intravenous Q24H    dextromethorphan polistirex ER  30 mg Oral Q12H SCH    gabapentin  400 mg Oral Q8H    hydrALAZINE  25 mg Oral Q8H [place]    insulin glargine  25 Units Subcutaneous QHS    insulin lispro  1-6 Units Subcutaneous QHS    insulin lispro  10 Units Subcutaneous TID AC    insulin lispro  2-10 Units Subcutaneous TID AC    pantoprazole  40 mg Oral QAM AC    sevelamer  800 mg Oral TID MEALS  PHYSICAL EXAM     Vitals:    02/07/21 1527   BP: 153/74   Pulse: (!) 101   Resp: 18   Temp: 98.5 F (36.9 C)   SpO2: 93%       Temperature: Temp  Min: 98.1 F (36.7 C)  Max: 98.9 F (37.2 C)  Pulse: Pulse  Min: 86  Max: 101  Respiratory: Resp  Min: 16  Max: 43  Non-Invasive BP: BP  Min: 95/61  Max: 168/78  Pulse Oximetry SpO2  Min: 90 %  Max: 100 %    Intake and Output Summary (Last 24 hours) at Date Time    Intake/Output Summary (Last 24 hours) at 02/07/2021 1630  Last data filed at 02/07/2021 1358  Gross per 24 hour   Intake 500 ml   Output 3100 ml   Net -2600 ml           GEN APPEARANCE: Obese female in mild respiratory distress A&OX3  HEENT: PERLA; EOMI; Conjunctiva Clear  NECK: Supple; No bruits  CVS: RRR, S1, S2; No M/G/R  LUNGS: Bilateral scattered rhonchi and wheezes  ABD: Soft; No TTP; + Normoactive BS  EXT: No edema; Pulses 2+ and intact  Skin exam: Warm and dry  NEURO: CN 2-12 intact; No Focal neurological  deficits  CAP REFILL: Less than 3 seconds capillary refill  MENTAL STATUS: Awake alert and oriented x3      LABS     Recent Labs   Lab 02/07/21  0346 02/06/21  0853 02/05/21  0321   WBC 5.38 4.87 4.58   RBC 3.41* 3.40* 3.37*   Hgb 9.9* 9.8* 9.9*   Hematocrit 33.3* 33.1* 32.6*   MCV 97.7* 97.4* 96.7*   Platelets 100* 94* 90*         Recent Labs   Lab 02/07/21  0346 02/06/21  0853 02/05/21  0321 02/04/21  0227   Sodium 137 137 138 136   Potassium 5.1 4.7 4.2 5.4*   Chloride 99 100 101 98*   CO2 24 27 28 23    BUN 70.0* 50.0* 28.0* 40.0*   Creatinine 6.7* 6.2* 4.9* 6.9*   Glucose 314* 365* 211* 484*   Calcium 8.0* 7.9* 7.9* 8.1*         Recent Labs   Lab 02/07/21  0346 02/06/21  0853 02/05/21  0321   ALT 12 14 16    AST (SGOT) 8 10 12    Bilirubin, Total 0.3 0.3 0.4   Albumin 2.4* 2.4* 2.3*   Alkaline Phosphatase 194* 185* 155*         Recent Labs   Lab 02/04/21  0645 02/04/21  0455   hs Troponin-I 109.2* 98.2*   hs Troponin-I Delta 11.0 5.9               Microbiology Results (last 15 days)       Procedure Component Value Units Date/Time    COVID-19 (SARS-CoV-2) only ([facility]) asymptomatic admission [[id number]] Collected: 02/04/21 0227    Order Status: Completed Specimen: Nasopharyngeal Updated: 02/04/21 0259     Purpose of COVID testing Screening     SARS-CoV-2 Specimen Source Nasal Swab     SARS CoV 2 Overall Result Not Detected     Comment: __________________________________________________  -A result of "Detected" indicates POSITIVE for the    presence of SARS CoV-2 RNA  -A result of "Not Detected" indicates NEGATIVE for the    presence of SARS CoV-2 RNA  __________________________________________________________  Test performed using the  Roche cobas [facility] SARS-CoV-2 assay. This assay is  only for use under the Food and Drug Administration's Emergency Use  Authorization. This is a real-time RT-PCR assay for the qualitative  detection of SARS-CoV-2 RNA. Viral nucleic acids may persist in vivo,  independent of  viability. Detection of viral nucleic acid does not imply the  presence of infectious virus, or that virus nucleic acid is the cause of  clinical symptoms. Negative results do not preclude SARS-CoV-2 infection and  should not be used as the sole basis for diagnosis, treatment or other  patient management decisions. Negative results must be combined with  clinical observations, patient history, and/or epidemiological information.  Invalid results may be due to inhibiting substances in the specimen and  recollection should occur. Please see Fact Sheets for patients and providers  located:  https://www.[employer].com/         Narrative:      o Collect and clearly label specimen type:  o PREFERRED-Upper respiratory specimen: One Nasal Swab in  Transport Media.  o Hand deliver to laboratory ASAP  Indication for testing->Extended care facility admission to  semi private room  Screening             RADIOLOGY     Chest AP Portable    Result Date: 02/04/2021  Mild pulmonary edema. Delaney Meigs, MD  02/04/2021 3:04 AM   Echo Results       Procedure Component Value Units Date/Time    Echocardiogram Adult Complete W Color Doppler Waveform [[id number]] Collected: 02/05/21 0902     Updated: 02/05/21 1254     MV Area (PHT) 6.94     RV Systolic Pressure 85.462     LA Volume Index (BP A-L) 0.032     MV E/e' (Average) 70.350     IVS Diastolic Thickness (2D) 0.93     LVID diastole (2D) 4.19     LVID systole (2D) 3.04     BP Mod LV Ejection Fraction 55.456     LA Dimension (2D) 4.4     AV Mean Gradient 4     AV Mean Gradient 4     AV Peak Velocity 142     AV Peak Velocity 141     AV Area (Cont Eq VTI) 1.953     AV Area (Cont Eq VTI) 1.87     Prox Ascending Aorta Diameter 3.3     MV E/A 1.66     MV E/A 1.7     RV Basal Diastolic Dimension 8.18     TAPSE 1.43     Aortic Valve Findings No aortic stenosis.     Aortic Valve Findings No aortic regurgitation.     Pulmonary Valve Findings Pulmonic valve is not adequately  visualized.     Mitral Valve Findings No mitral stenosis.     Mitral Valve Findings Trace mitral regurgitation.     Tricuspid Valve Findings No tricuspid stenosis.     Tricuspid Valve Findings Mild-Moderate tricuspid regurgitation.    Narrative:      Name:     AZUSENA ERLANDSON  Age:     19 years  DOB:     1955/09/17  Gender:     Female  MRN:     [id number]  Wt:     238 lb  BSA:     2.24 m2  HR:     83 bpm  Systolic BP:     678 mmHg  Diastolic BP:     72 mmHg  Technical Quality:     Good  Exam Date/Time:     02/05/2021 9:02 AM  Study Site:     [facility]  Exam Room:     Bedside  Exam Type:     ECHOCARDIOGRAM ADULT COMPLETE W CLR/ DOPP WAVEFORM    Study Info  Indications      shortness of breath -  Procedure    Complete two-dimensional, color flow and spectral Doppler transthoracic  echocardiogram is performed.    Staff  Sonographer:     Steele Sizer  Ordering Physician:     Jamelle Rushing    62 in      History/Risk Factors  Hypertension:     Yes    Prior Cardiac Studies  Date of Prior Echo:     08/01/19      Summary    * Borderline right ventricular function.    * Normal left ventricular ejection fraction.    * No wall motion abnormalities seen.    * Mild-Moderate tricuspid regurgitation.    * By Simpson's, LV EF is 55%.      Findings  Left Ventricle    Concentric left ventricular remodeling. Normal left ventricular ejection  fraction. No wall motion abnormalities seen. By Simpson's, LV EF is 55%.  Right Ventricle    Normal right ventricular size. Borderline right ventricular function.      Left Atrium    Normal left atrial size.    Right Atrium    Normal right atrial size.      Aortic Valve    No aortic stenosis. No aortic regurgitation.    Pulmonary Valve    Pulmonic valve is not adequately visualized.    Mitral Valve    No mitral stenosis. Trace mitral regurgitation.    Tricuspid Valve    No tricuspid stenosis. Mild-Moderate tricuspid regurgitation.      Other Findings    Indeterminate diastolic function, but mitral in-flow and  tissue Doppler data  are suggestive of elevated left atrial pressure.    Pericardium / Pleural Effusion    A  pleural effusion is present.    Aorta    Normal size aortic root. Normal size proximal ascending aorta.      Measurements  2D Measurements  ----------------------------------------------------------------------  Name                                 Value        Normal  ----------------------------------------------------------------------    Parasternal 2D  ----------------------------------------------------------------------   IVS Diastolic Thickness  (2D)                               1.27 cm     0.60-0.90   LVID Diastole (2D)                 4.19 cm     1.70-0.17    LVIW Diastolic Thickness  (2D)                               1.18 cm     0.60-0.95   LVID Systole (2D)                  3.04 cm     2.20-3.50   LVOT Diameter  2.10 cm                 LA Dimension (2D)                  4.40 cm     2.70-3.80   Prox Asc Ao Diameter               3.30 cm     2.30-3.10     LV Ejection Fraction 2D  ----------------------------------------------------------------------  LV EF (BP MOD)                        55 %         54-74     Apical 2D Dimensions  ----------------------------------------------------------------------   RV Basal Diastolic  Dimension                          4.04 cm     2.50-4.10   LA Volume Index (BP A-L)        32.22 ml/m2       <=34.00   RA Area (4C)                     15.60 cm2       <=18.00   RV FAC (4C)                           16 %          >=35  M-mode Measurements  ----------------------------------------------------------------------  Name                                 Value        Normal  ----------------------------------------------------------------------    M-Mode  ----------------------------------------------------------------------  TAPSE                              1.43 cm        >=1.60  LVOT/Aortic Valve Doppler  Measurements  ----------------------------------------------------------------------  Name                                 Value        Normal  ----------------------------------------------------------------------    LVOT Doppler  ----------------------------------------------------------------------  LVOT Peak Velocity                0.83 m/s                   AoV Doppler  ----------------------------------------------------------------------  AV Peak Velocity                  1.42 m/s                 AV Mean Gradient                    4 mmHg           <20   AV Area (Cont Eq VTI)             1.95 cm2        >=3.00   AV V1/V2 Ratio  0.61  RVOT/Pulmonic Valve Doppler Measurements  ----------------------------------------------------------------------  Name                                 Value        Normal  ----------------------------------------------------------------------    PV Doppler  ----------------------------------------------------------------------  PV Peak Velocity                  0.81 m/s  Mitral Valve Measurements  ----------------------------------------------------------------------  Name                                 Value        Normal  ----------------------------------------------------------------------    MV Doppler  ----------------------------------------------------------------------  MV E Peak Velocity                1.27 m/s                 MV A Peak Velocity                0.77 m/s                 MV E/A                                1.66                   MV Annular TDI  ----------------------------------------------------------------------  MV Septal e' Velocity             0.06 m/s        >=0.08   MV E/e' (Septal)                     20.89        <=8.00   MV Lateral e' Velocity            0.05 m/s        >=0.10   MV E/e' (Lateral)                    25.40        <=8.00  Tricuspid Valve  Measurements  ----------------------------------------------------------------------  Name                                 Value        Normal  ----------------------------------------------------------------------    TV Regurgitation Doppler  ----------------------------------------------------------------------  TR Peak Velocity                  2.29 m/s                 TR Peak Gradient                   21 mmHg                 RA Pressure                        15 mmHg           <=3   RV Systolic Pressure               36 mmHg           <  74  Aorta / Venous Measurements  ----------------------------------------------------------------------  Name                                 Value        Normal  ----------------------------------------------------------------------    IVC/SVC  ----------------------------------------------------------------------  IVC Diameter (Exp 2D)              2.06 cm        <=2.10    IVC Diameter Percent Change  (2D)                                   7 %          >=50          Results for orders placed or performed during the hospital encounter of 10/20/20   CT Head WO Contrast    Narrative    CT HEAD WO CONTRAST:10/21/2020 2:42 AM     CLINICAL HISTORY: .Head trauma, minor, normal mental status ()  patient fell and struck her head:Head trauma, minor, normal mental  status (Age )    TECHNIQUE: Axial images were obtained from the skull base to vertex  without contrast. Study is somewhat suboptimal and there are multiple  regions of beam hardening artifact in the periphery of the frontal lobes  bilaterally which does reduce sensitivity for detection of small regions  of interest parenchymal or subarachnoid blood      Comparison 07/01/19  FINDINGS: The ventricles and sulci are moderately prominent but  consistent with patient's chronologic age. Decreased density is present  in the deep white matter of each hemisphere consistent with small vessel  disease change. No intracerebral mass or  hemorrhage is present, and no  extra-axial collections are present.      Impression     No currently detectable acute intracranial hemorrhage or  mass effect or edema. Mildly suboptimal study for detection of small  amounts of hemorrhage due to multiple regions of beam hardening artifact  in the periphery of the frontal lobes..    Note: Note that CT scanning at this site  utilizes multiple dose  reduction techniques including automatic exposure control, adjustment of  the MAA and/or KVP according to patient's size and use of iterative  reconstruction technique    Elyn Peers, MD   10/21/2020 8:16 AM   Results for orders placed or performed during the hospital encounter of 06/23/19   MRI Brain WO Contrast    Narrative    Clinical History:  right sided weakness and numbness     Examination:  Multiplanar MRI of the brain was performed without intravenous contrast,  using standard departmental protocol.    Comparison:  CT head dated 06/23/2019  MRI brain dated 03/19/2006    Findings:    Diffusion weighted images of the brain demonstrate no evidence of acute  infarction.  There is no evidence of acute intracranial hemorrhage,  extra-axial collection, mass effect, midline shift, herniation or  hydrocephalus.  The ventricles, sulci and cisterns appear age  appropriate.   There are mild periventricular white matter T2 FLAIR  hyperintensities, which most likely represent chronic microangiopathic  changes. There are no signal abnormalities on the susceptibility  weighted sequences.   The major vascular flow voids are present.    The  visualized paranasal sinuses and mastoid air cells are clear.  The  surrounding soft tissues and osseous structures are unremarkable.        Impression    No evidence of acute infarction, intracranial hemorrhage, mass effect  or hydrocephalus.    Gillermina Phy, MD   06/23/2019 8:04 PM       Signed,  Lisa Roca, MD  4:30 PM 02/07/2021

## 2021-02-08 LAB — CBC AND DIFFERENTIAL
Absolute NRBC: 0 10*3/uL (ref 0.00–0.00)
Basophils Absolute Automated: 0.02 10*3/uL (ref 0.00–0.08)
Basophils Automated: 0.4 %
Eosinophils Absolute Automated: 0.11 10*3/uL (ref 0.00–0.44)
Eosinophils Automated: 2.4 %
Hematocrit: 33.2 % — ABNORMAL LOW (ref 34.7–43.7)
Hgb: 9.9 g/dL — ABNORMAL LOW (ref 11.4–14.8)
Immature Granulocytes Absolute: 0.03 10*3/uL (ref 0.00–0.07)
Immature Granulocytes: 0.7 %
Instrument Absolute Neutrophil Count: 2.8 10*3/uL (ref 1.10–6.33)
Lymphocytes Absolute Automated: 1.1 10*3/uL (ref 0.42–3.22)
Lymphocytes Automated: 24.1 %
MCH: 28.9 pg (ref 25.1–33.5)
MCHC: 29.8 g/dL — ABNORMAL LOW (ref 31.5–35.8)
MCV: 97.1 fL — ABNORMAL HIGH (ref 78.0–96.0)
MPV: 12.5 fL (ref 8.9–12.5)
Monocytes Absolute Automated: 0.5 10*3/uL (ref 0.21–0.85)
Monocytes: 11 %
Neutrophils Absolute: 2.8 10*3/uL (ref 1.10–6.33)
Neutrophils: 61.4 %
Nucleated RBC: 0 /100 WBC (ref 0.0–0.0)
Platelets: 96 10*3/uL — ABNORMAL LOW (ref 142–346)
RBC: 3.42 10*6/uL — ABNORMAL LOW (ref 3.90–5.10)
RDW: 14 % (ref 11–15)
WBC: 4.56 10*3/uL (ref 3.10–9.50)

## 2021-02-08 LAB — COMPREHENSIVE METABOLIC PANEL
ALT: 12 U/L (ref 0–55)
AST (SGOT): 9 U/L (ref 5–41)
Albumin/Globulin Ratio: 0.8 — ABNORMAL LOW (ref 0.9–2.2)
Albumin: 2.7 g/dL — ABNORMAL LOW (ref 3.5–5.0)
Alkaline Phosphatase: 159 U/L — ABNORMAL HIGH (ref 37–117)
Anion Gap: 12 (ref 5.0–15.0)
BUN: 49 mg/dL — ABNORMAL HIGH (ref 7.0–21.0)
Bilirubin, Total: 0.3 mg/dL (ref 0.2–1.2)
CO2: 25 mEq/L (ref 17–29)
Calcium: 8.4 mg/dL — ABNORMAL LOW (ref 8.5–10.5)
Chloride: 102 mEq/L (ref 99–111)
Creatinine: 4.6 mg/dL — ABNORMAL HIGH (ref 0.4–1.0)
Globulin: 3.2 g/dL (ref 2.0–3.6)
Glucose: 333 mg/dL — ABNORMAL HIGH (ref 70–100)
Potassium: 5 mEq/L (ref 3.5–5.3)
Protein, Total: 5.9 g/dL — ABNORMAL LOW (ref 6.0–8.3)
Sodium: 139 mEq/L (ref 135–145)

## 2021-02-08 LAB — GLUCOSE WHOLE BLOOD - POCT
Whole Blood Glucose POCT: 243 mg/dL — ABNORMAL HIGH (ref 70–100)
Whole Blood Glucose POCT: 284 mg/dL — ABNORMAL HIGH (ref 70–100)
Whole Blood Glucose POCT: 226 mg/dL — ABNORMAL HIGH (ref 70–100)

## 2021-02-08 LAB — GFR: EGFR: 11.5

## 2021-02-08 MED ORDER — DEXTROMETHORPHAN-GUAIFENESIN ER 30-600 MG PO TB12
1.0000 | ORAL_TABLET | Freq: Two times a day (BID) | ORAL | 1 refills | Status: DC
Start: 2021-02-08 — End: 2021-06-01

## 2021-02-08 MED ORDER — CEFUROXIME AXETIL 250 MG PO TABS
250.0000 mg | ORAL_TABLET | Freq: Two times a day (BID) | ORAL | 0 refills | Status: AC
Start: 2021-02-08 — End: 2021-02-13

## 2021-02-08 NOTE — Discharge Instr - Activity (Signed)
Activity as tolerated

## 2021-02-08 NOTE — Discharge Instr - AVS First Page (Addendum)
Reason for your Hospital Admission:  You were admitted because you had fluid in your lungs from missing dialysis      Instructions for after your discharge:  Take your medications and follow up as recommended          Home Health Discharge Information    Your doctor has ordered Skilled Nursing, Physical Therapy, and Occupational Therapy in-home service(s) for you while you recuperate at home, to assist you in the transition from hospital to home.    The agency that you or your representative chose to provide the service:  Name of [facility]: [facility]  [phone], will resume services.    The above services were set up by:  Mayme Genta, RN [facility] Liaison) Phone [phone]      IF YOU HAVE NOT HEARD [place] 24-48 HOURS AFTER DISCHARGE PLEASE CALL YOUR AGENCY TO ARRANGE A TIME FOR YOUR FIRST VISIT. FOR ANY SCHEDULING CONCERNS OR QUESTIONS RELATED TO HOME HEALTH, SUCH AS TIME OR DATE PLEASE CONTACT YOUR HOME HEALTH AGENCY AT THE NUMBER LISTED ABOVE.

## 2021-02-08 NOTE — Progress Notes (Signed)
[place]        Patient Name: [name], [name]     MRN: [id number]      CSN: [id number]         [place] #   [id number] Patient Class   Observation Service  Medicine Accommodation Code  Intermediate Care      Admission Information     Admitting Physician:  Attending Physician: Sheppard Coil, MD  Lisa Roca, MD Unit  [address] INTERMED * L&D Status      Admitting Diagnosis: Acute pulmonary edema; NSTEMI (non-ST elevated myocardial infarction); Hyp* Room / Bed  W3888/K8003-K L&D - Last Menstrual Cycle      Chief Complaint: Difficulty breathing; Co*     Admit Type:  Admit Date/Time:  Discharge Date/Time: Emergency  02/04/2021 / 0141   /  Length of Stay: 0 Days    L&D EDD   Estimated Date of Delivery: None noted.      Patient Information              Home Address: [address] [place] [address] Employer:  Employer Address:       ,     Main Phone: [phone] Employer Phone:     SSN: [id number]       DOB: [birth year] (38 yrs)       Sex: Female Primary Care Physician: Diane Marshall, MD   Marital Status: Legally Separated Referring Physician:       No ref. provider found   Race: Black or African American       Ethnicity: Not of Hispanic/Latino/S*       Emergency Contacts  Name Home Phone Work Phone Mobile Phone Relationship [name]     [phone] Daughter No   [name]     [phone] Spouse           Guarantor Information     Guarantor Name: [name], [name] Guarantor ID: [id number]   Guarantor Relationship to Pt: Self Guarantor Type: Personal/Family   Guarantor DOB:    [birth year]       Guarantor Address: 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 Emp Phone:                     [employer]     Insurance Name: Kirt Boys [place] Name: [place] Address:     [address] [place], [place] DOB: 03-28-1955      Subscriber ID: [id number]   Insurance Phone: [phone] Pt Relationship to Sub:   Self   Insurance ID:         Group Name:   Preauthorization #: [id number]   Group #: [id number] Preauthorization Days:        Secondary Insurance     Insurance Name: Lesia Sago Pratt Name: [place] Address:    [address] [place], [place] Subscriber DOB: 01/02/1956      Subscriber ID: [id number]   Insurance Phone: [phone] Pt Relationship to Sub:   Self   Insurance ID:         Group Name:   Preauthorization #:     Group #: [id number] Preauthorization  Days:        [employer] Name: - [place] Name:     Insurance underwriter Address:       ,   Veterinary surgeon DOB:        Subscriber ID:     Veterinary surgeon:   Pt Relationship to Sub:       Insurance ID:         Group Name:   Preauthorization #:     Group #:   Preauthorization Days:           02/08/2021 1:45 PM          Home Health face-to-face (FTF) Encounter (Order [id number])  Consult  Date: 02/08/2021 Department: [facility] [address] Intermediate Care Released By: Mayme Genta, RN (auto-released) Authorizing: Lisa Roca, MD     Order Information    Order Date/Time Release Date/Time Start Date/Time End Date/Time   02/08/21 01:32 PM 02/08/21 01:32 PM 02/08/21 01:29 PM 02/08/21 01:29 PM     Order Details    Frequency Duration Priority Order Class   Once 1  occurrence Routine Hospital Performed     Provider Information    Ordering User Ordering Provider Authorizing Provider   Mayme Genta, RN [place], Lawerance Sabal, MD Lisa Roca, MD   Attending Provider(s) Admitting Provider PCP   Rochel Brome, DO; Sheppard Coil, MD; Canary Brim, DO; Lisa Roca, MD Sheppard Coil, MD Diane Marshall, MD     Cosign Order Info    Action Created on Order Mode Entered by Responsible Provider Signed by Signed on   Ordering 02/08/21 1332 Telephone with  Susanne Borders, RN [place], Lawerance Sabal, MD Lisa Roca, MD 02/08/21 1336     Comments    Oxygen __2__liters/minute via nasal cannula with ambulation or activity with portability of gas for >99 months   NPI: [id number]     Diagnosis:   COPD  J44.0   Pulmonary edema J81.1              Home Health face-to-face (FTF) Encounter: Patient Communication     Not Released  Not seen         Order Questions    Question Answer   Date I saw the patient face-to-face: 02/08/2021   Evidence this patient is homebound because: O.  N/A DME only   Medical conditions that necessitate Home Health care: M.  N/A DME only. No skilled services needed   Per clinical findings, following services are medically necessary: DME   DME Oxygen                    Process Instructions    Please select Home Care Services medically necessary.     Based on the above findings, I certify that this patient is confined to the home and needs intermittent skilled nursing care, physical therapry and / or speech therapy or continues to need occupational therapy. The patient is under my care, and I have initiated the establishment of the plan of care. This patient will be followed by a physician who will periodically review the plan of care.      Collection Information            Consult Order Info    ID Description Priority Start Date Start Time   [id number] [place] face-to-face (FTF) Encounter Routine 02/08/2021  1:29 PM   Provider Specialty Referred to   ______________________________________ _____________________________________  Acknowledgement Info    For At Acknowledged By Acknowledged On   Placing Order 02/08/21 1332 Christena Flake, RN 02/08/21 1340                     Cosign Order Forensic psychologist Created on Order Mode Entered by Responsible Provider Signed by Signed on   Ordering 02/08/21 1332 Telephone with Susanne Borders, RN [place], Lawerance Sabal, MD Lisa Roca, MD 02/08/21 [place]    User Date/Time   Mayme Genta, RN 02/08/21 1332         Patient Information    Patient Name   [name] Legal Sex   Female DOB   Jun 19, 1955       Reprint Order [place] face-to-face (FTF) Encounter (Order #[id number]) on 02/08/21       Additional Information    Associated Reports External References   View Parent Encounter [facility]   Priority and Order Details

## 2021-02-08 NOTE — Discharge Instr - Diet (Signed)
Diet ; Consistent Carbohydrate and Heart Healthy; [place]anal Diet

## 2021-02-08 NOTE — Nursing Progress Note (Signed)
Lab result platelet 96, Ca was 8.4 attending MD Chinery made aware, non new order at this time.

## 2021-02-08 NOTE — Discharge Summary (Signed)
[facility]      Patient: [name]  Admission Date: 02/04/2021   DOB: [birth year]  Discharge Date: 02/08/2021    MRN: [id number]  Discharge Attending:Arvilla Salada Precious Reel, MD     Referring Physician: Horton Marshall, MD  PCP: Horton Marshall, [place] SUMMARY     Discharge Information   Admission Diagnosis:   Hypertensive emergency    Discharge Diagnosis:   Active Hospital Problems    Diagnosis    Hypertensive emergency   Fluid overload/CHF due to missed dialysis  End-stage renal disease on dialysis  Thrombocytopenia chronic  Anemia due to CKD  Acute respiratory failure with hypoxia due to fluid overload/acute bronchitis  Acute bronchitis  Hyperlipidemia  Noncompliance  Obesity  Diabetes mellitus with hyperglycemia  Deconditioning  COPD  Sleep apnea  Flash pulmonary edema      Admission Condition: Guarded  Discharge Condition: Stable and improved  Consultants: Dr. Jamelle Rushing [facility] cardiology Dr. Debroah Baller nephrology  Functional Status: Ambulatory  Discharged to: [facility] Course   Presentation History   Ms. Charlann Noss is a 66 year old African-American lady who is in scheduled disease on dialysis who presented to the emergency room with onset of dyspnea and hypertensive emergency  She had missed her dialysis and developed dyspnea over the preceding 2 days and presented to the emergency room on presentation she was found to have markedly elevated blood pressure readings consistent with hypertensive emergency as well as fluid overload on x-ray she was admitted for further work-up    See HPI for details.    Hospital Course (0 Days)   Patient was admitted to the hospital and was seen in consultation by nephrology and cardiology she underwent dialysis and ultrafiltration for 4 conservative days before she returned close to her dry weight.  Course was also complicated by cough productive of sputum she was treated with IV antibiotics beta agonist and anticholinergics by  nebulizer supplemental oxygen and mucolytic agents  She improved with above measures albeit slowly over the ensuing few days  On the day of discharge she had another session of dialysis and she returned to her baseline  Discharged home in stable medical condition as outlined below for further outpatient management  She was counseled extensively on adherence to her dialysis regimen and also adherence to her medications.  She expressed understanding and made a commitment to follow through on this           Best Practices   Was the patient admitted with either a CHF Exacerbation or Pneumonia?  CHF/fluid overload     Progress Note/Physical Exam at Discharge     Subjective: Patient feels well and anxious to go home    General: NAD, AAOx3  HEENT: perrla, eomi, sclera anicteric, OP: Clear, MMM  Neck: supple, FROM, no LAD  Cardiovascular: RRR, no m/r/g  Lungs: CTAB, no w/r/r  Abdomen: soft, +BS, NT/ND, no masses, no g/r  Extremities: no C/C/E  Skin: no rashes or lesions noted  Neuro: CN 2-12 intact; No Focal neurological deficits       Diagnostics     Labs/Studies Pending at Discharge: None    Last Labs   Recent Labs   Lab 02/08/21  0331 02/07/21  0346 02/06/21  0853   WBC 4.56 5.38 4.87   RBC 3.42* 3.41* 3.40*   Hgb 9.9* 9.9* 9.8*   Hematocrit 33.2* 33.3* 33.1*   MCV 97.1* 97.7* 97.4*  Platelets 96* 100* 94*       Recent Labs   Lab 02/08/21  0331 02/07/21  0346 02/06/21  0853 02/05/21  0321 02/04/21  0227   Sodium 139 137 137 138 136   Potassium 5.0 5.1 4.7 4.2 5.4*   Chloride 102 99 100 101 98*   CO2 25 24 27 28 23    BUN 49.0* 70.0* 50.0* 28.0* 40.0*   Creatinine 4.6* 6.7* 6.2* 4.9* 6.9*   Glucose 333* 314* 365* 211* 484*   Calcium 8.4* 8.0* 7.9* 7.9* 8.1*       Microbiology Results (last 15 days)       Procedure Component Value Units Date/Time    COVID-19 (SARS-CoV-2) only (Liat Rapid) asymptomatic admission [[id number]] Collected: 02/04/21 0227    Order Status: Completed Specimen: Nasopharyngeal Updated: 02/04/21 0259      Purpose of COVID testing Screening     SARS-CoV-2 Specimen Source Nasal Swab     SARS CoV 2 Overall Result Not Detected     Comment: __________________________________________________  -A result of "Detected" indicates POSITIVE for the    presence of SARS CoV-2 RNA  -A result of "Not Detected" indicates NEGATIVE for the    presence of SARS CoV-2 RNA  __________________________________________________________  Test performed using the Roche cobas Liat SARS-CoV-2 assay. This assay is  only for use under the Food and Drug Administration's Emergency Use  Authorization. This is a real-time RT-PCR assay for the qualitative  detection of SARS-CoV-2 RNA. Viral nucleic acids may persist in vivo,  independent of viability. Detection of viral nucleic acid does not imply the  presence of infectious virus, or that virus nucleic acid is the cause of  clinical symptoms. Negative results do not preclude SARS-CoV-2 infection and  should not be used as the sole basis for diagnosis, treatment or other  patient management decisions. Negative results must be combined with  clinical observations, patient history, and/or epidemiological information.  Invalid results may be due to inhibiting substances in the specimen and  recollection should occur. Please see Fact Sheets for patients and providers  located:  https://www.benson-chung.com/         Narrative:      o Collect and clearly label specimen type:  o PREFERRED-Upper respiratory specimen: One Nasal Swab in  Transport [employer].  o Hand deliver to laboratory ASAP  Indication for testing->Extended care facility admission to  semi private room  Screening             Patient Instructions   Discharge Diet: Consistent carbohydrate cardiac diet  Discharge Activity: As tolerated    Follow Up Appointment:   Follow-up Information       Horton Marshall, MD .    Specialty: Internal Medicine  Contact information:  [place] [address]  [place] Follow up.    Specialty: Home Health Services  Contact information:  [address]  [place]             Corky Mull, MD Follow up on 02/09/2021.    Specialties: Nephrology, Internal Medicine  Why: dialysis  Contact information:  [address]  [place] [address]  [phone]                              Discharge Medications:  Medication List        START taking these medications      cefuroxime 250 MG tablet  Commonly known as: CEFTIN  Take 1 tablet (250 mg) by mouth 2 (two) times daily for 5 days     dextromethorphan-guaiFENesin 30-600 MG per 12 hr tablet  Commonly known as: MUCINEX DM  Take 1 tablet by mouth every 12 (twelve) hours            CONTINUE taking these medications      acetaminophen 500 MG tablet  Commonly known as: TYLENOL     albuterol sulfate HFA 108 (90 Base) MCG/ACT inhaler  Commonly known as: PROVENTIL     amLODIPine 10 MG tablet  Commonly known as: NORVASC     aspirin 81 MG chewable tablet     atorvastatin 40 MG tablet  Commonly known as: LIPITOR     carvedilol 25 MG tablet  Commonly known as: COREG     gabapentin 600 MG tablet  Commonly known as: NEURONTIN     insulin aspart 100 UNIT/ML injection  Commonly known as: NovoLOG     pantoprazole 40 MG tablet  Commonly known as: PROTONIX     sevelamer 800 MG tablet  Commonly known as: RENVELA     TRESIBA SC            STOP taking these medications      LACTOBACILLUS PO               Where to Get Your Medications        These medications were sent to [id number] - [address], [place]  [place], [place] [address]      Phone: [phone]   cefuroxime 250 MG tablet  dextromethorphan-guaiFENesin 30-600 MG per 12 hr tablet          Time spent examining patient, discussing with patient/family regarding hospital course, chart review, reconciling medications and discharge planning: > 35  minutes.    Signed,  Lisa Roca,  MD    11:16 PM 02/08/2021

## 2021-02-08 NOTE — Nursing Progress Note (Signed)
Pt alert and oriented x4. Hurry to leave hospital and inpatient. VSS. D/C'd per MD order. Removed IV and tele box. Discharge documents explained to pt, verbalized understanding.  All concerns and questions are addressed. Discharged in stable condition with all her belongings. O2 tank given to pt. facility Transport accompanied pt. Picked up by husband.

## 2021-02-08 NOTE — Progress Notes (Addendum)
01/17 @ 1347  Name of DME company: [facility])  (P): [phone]   (F): [phone]  Note:  Home oxygen referral faxed directly to [place]. DME company will contact pt/family, pending pt's insurance review and approval.    @1520  Rep Sig with [employer], delivered oxygen tank to pt's hospital room.

## 2021-02-08 NOTE — Progress Notes (Signed)
Pt. to [place] home with  [facility]- SN/PT/OT/Home O2 awaiting authorization.  Referral to HD Coordinator Kellen via secure chat to confirm OP HD [facility], family will transport.  Meredeth Ide BSN RN  RN Case Manager  [facility]  [phone]

## 2021-02-08 NOTE — Plan of Care (Signed)
Problem: Safety  Goal: Patient will be free from injury during hospitalization  Outcome: Progressing  Goal: Patient will be free from infection during hospitalization  Outcome: Progressing     Problem: Pain  Goal: Pain at adequate level as identified by patient  Outcome: Progressing     Problem: Side Effects from Pain Analgesia  Goal: Patient will experience minimal side effects of analgesic therapy  Outcome: Progressing     Problem: Renal Instability  Goal: Fluid and electrolyte balance are achieved/maintained  Outcome: Progressing     Problem: Patient Receiving Advanced Renal Therapies  Goal: Therapy access site remains intact  Outcome: Progressing     Problem: Compromised Tissue integrity  Goal: Damaged tissue is healing and protected  Outcome: Progressing  Goal: Nutritional status is improving  Outcome: Progressing     Problem: Hemodynamic Status: Cardiac  Goal: Stable vital signs and fluid balance  Outcome: Progressing

## 2021-02-08 NOTE — Plan of Care (Signed)
Problem: Safety  Goal: Patient will be free from injury during hospitalization  Outcome: Progressing  Goal: Patient will be free from infection during hospitalization  Outcome: Progressing     Problem: Pain  Goal: Pain at adequate level as identified by patient  Outcome: Progressing     Problem: Side Effects from Pain Analgesia  Goal: Patient will experience minimal side effects of analgesic therapy  Outcome: Progressing     Problem: Discharge Barriers  Goal: Patient will be discharged home or other facility with appropriate resources  Outcome: Progressing     Problem: Psychosocial and Spiritual Needs  Goal: Demonstrates ability to cope with hospitalization/illness  Outcome: Progressing     Problem: Renal Instability  Goal: Fluid and electrolyte balance are achieved/maintained  Outcome: Progressing  Goal: Free from infection  Outcome: Progressing     Problem: Patient Receiving Advanced Renal Therapies  Goal: Therapy access site remains intact  Outcome: Progressing     Problem: Compromised Tissue integrity  Goal: Damaged tissue is healing and protected  Outcome: Progressing  Goal: Nutritional status is improving  Outcome: Progressing     Problem: Hemodynamic Status: Cardiac  Goal: Stable vital signs and fluid balance  Outcome: Progressing

## 2021-02-08 NOTE — Progress Notes (Signed)
[facility] PROGRESS NOTE  Aaron Edelman, x [phone] [facility])      Date Time: 02/08/21 10:14 AM  Patient Name: [name]  Attending Physician: Lisa Roca, MD    CC: follow-up ESRD, volume overload     Assessment:   ESRD on HD MWF at [facility] - followed by Dr. Randel Pigg  - missed HD on 1/11 due to diarrhea/not feeling well   Accelerated hypertension due to volume overload in the setting of missed HD - resolved   Volume overload - significantly improved   Anemia in CKD   Hyperphosphatemia - controlled  Secondary hyperparathyroidism    Recommendations:     HD tomorrow if still in house   Stable for discharge from renal perspective      Case discussed with: HD RN     Gaylyn Lambert, MD, MD  [facility]  [phone] (office)  X [phone] ([place])      Subjective: comfortably resting , no complains   Wants to go home    Review of Systems:   No cp  No sob    Physical Exam:     Vitals:    02/08/21 0315 02/08/21 0525 02/08/21 0700 02/08/21 0756   BP: 166/72 143/68  140/67   Pulse: 85  87 84   Resp: 14  (!) 30 (!) 23   Temp: 97.8 F (36.6 C)   97.5 F (36.4 C)   TempSrc: Oral   Oral   SpO2: 98%  100% 97%   Weight: 98.6 kg (217 lb 4.8 oz)      Height:           Intake and Output Summary (Last 24 hours) at Date Time    Intake/Output Summary (Last 24 hours) at 02/08/2021 1014  Last data filed at 02/07/2021 1358  Gross per 24 hour   Intake --   Output 3100 ml   Net -3100 ml         General: comfortably resting   Cardiovascular: regular rate and rhythm  Lungs: few crackles noted, no wheezing, bilateral air entry  Abdomen: soft  Extremities: trace edema    LUE AVF    Meds:      Scheduled Meds: PRN Meds:    albuterol-ipratropium, 3 mL, Nebulization, Q6H WA  amLODIPine, 10 mg, Oral, Daily  aspirin, 81 mg, Oral, Daily  atorvastatin, 40 mg, Oral, QHS  cefTRIAXone, 2 g, Intravenous, Q24H  dextromethorphan polistirex ER, 30 mg, Oral, Q12H SCH  gabapentin, 400 mg, Oral,  Q8H  hydrALAZINE, 25 mg, Oral, Q8H SCH  insulin glargine, 25 Units, Subcutaneous, QHS  insulin lispro, 1-6 Units, Subcutaneous, QHS  insulin lispro, 10 Units, Subcutaneous, TID AC  insulin lispro, 2-10 Units, Subcutaneous, TID AC  pantoprazole, 40 mg, Oral, QAM AC  sevelamer, 800 mg, Oral, TID MEALS        Continuous Infusions:   albuterol, 5 mg, Q6H PRN  dextrose, 15 g of glucose, PRN   And  dextrose, 12.5 g, PRN   And  dextrose, 12.5 g, PRN   And  glucagon (rDNA), 1 mg, PRN  guaiFENesin, 200 mg, Q4H PRN  hydrALAZINE, 10 mg, Q6H PRN  naloxone, 0.4 mg, PRN  oxyCODONE, 5 mg, Q6H PRN            Labs:     Recent Labs   Lab 02/08/21  0331 02/07/21  0346 02/06/21  0853   WBC 4.56 5.38 4.87   Hgb 9.9* 9.9* 9.8*  Hematocrit 33.2* 33.3* 33.1*   Platelets 96* 100* 94*       Recent Labs   Lab 02/08/21  0331 02/07/21  0346 02/06/21  0853   Sodium 139 137 137   Potassium 5.0 5.1 4.7   Chloride 102 99 100   CO2 25 24 27    BUN 49.0* 70.0* 50.0*   Creatinine 4.6* 6.7* 6.2*   Calcium 8.4* 8.0* 7.9*   Albumin 2.7* 2.4* 2.4*   Glucose 333* 314* 365*   EGFR 11.5 7.5 8.2         Signed by: Gaylyn Lambert, MD, MD

## 2021-02-08 NOTE — Nursing Progress Note (Addendum)
PULSE OXIMETRY TESTING:   Do not document ranges.    Document patient's oxygen at rest on room air:     __92__% on room air, at rest   _94____% on oxygen, at rest at_2____LPM via NC      IF 88% OR BELOW on room air, STOP HERE,     IF NOT ambulate patient on room air with exertion and document below:     ___88__% on room air, with exertion (must be 88% or below)     __92___% on oxygen, with exertion, at_2____LPM via NC      All three tests must be during the same session.   Testing to qualify for home oxygen must be no earlier than 48 hours prior to discharge, or it will need to be repeated.

## 2021-02-08 NOTE — Progress Notes (Signed)
PULSE OXIMETRY TESTING:   Document patient's oxygen at rest on room air:   _____% on room air, at rest (No ranges please)   _____% on oxygen, at rest at_____LPM via NC  IF 88% OR BELOW on room air, STOP HERE,   IF NOT ambulate patient on room air with exertion and document below:   _____% on room air, with exertion (must be 88% or below)   _____% on oxygen, with exertion, at_____LPM via NC    All three tests must be during the same session.     Please copy and paste this template,  document saturations in appropriate places, (NO RANGES PLEASE)  in a PROGRESS NOTE.     Testing to qualify for home oxygen must be no earlier than 48 hours prior to discharge, or it will need to be repeated.

## 2021-02-08 NOTE — Progress Notes (Signed)
Home Health Referral      Referral from  (Case Manager) for home health care upon discharge.    By [employer], the patient has the right to freely choose a home care provider.    Arrangements have been made with:    A company of the patients choosing. We have supplied the patient with a listing of providers in your area who asked to be included and participate in Medicare.   [facility], formerly [facility], a home care agency that provides adult home care services and participates in Medicare   The preferred provider of your insurance company. Choosing a home care provider other than your insurance company's preferred provider may affect your insurance coverage.      Home Health Discharge Information    Your doctor has ordered Skilled Nursing, Physical Therapy, and Occupational Therapy in-home service(s) for you while you recuperate at home, to assist you in the transition from hospital to home.    The agency that you or your representative chose to provide the service:  Name of [facility]: [place]  [phone], will resume services.    The above services were set up by:  Mayme Genta, RN [facility] Liaison) Phone [phone]      IF YOU HAVE NOT HEARD [place] 24-48 HOURS AFTER DISCHARGE PLEASE CALL YOUR AGENCY TO ARRANGE A TIME FOR YOUR FIRST VISIT. FOR ANY SCHEDULING CONCERNS OR QUESTIONS RELATED TO HOME HEALTH, SUCH AS TIME OR DATE PLEASE CONTACT YOUR HOME HEALTH AGENCY AT THE NUMBER LISTED ABOVE.    Component 02/04/21  2:27 AM    Purpose of COVID testing Screening    SARS-CoV-2 Specimen Source Nasal Swab    SARS CoV 2 Overall Result Not Detected         Home Health face-to-face (FTF) Encounter (Order [id number])  Consult  Date: 02/08/2021 Department: [facility] [address] [facility] Ordering/Authorizing: Lisa Roca, MD     Order Information    Order Date/Time Release Date/Time Start Date/Time End Date/Time    02/08/21 09:03 AM None 02/08/21 08:54 AM 02/08/21 08:54 AM     Order Details    Frequency Duration Priority Order Class   Once 1  occurrence Routine Hospital Performed     Standing Order Information    Remaining Occurrences Interval Last Released     0/1 Once 02/08/2021              Provider Information    Ordering User Ordering Provider Authorizing Provider   Mayme Genta, RN [place], Lawerance Sabal, MD Lisa Roca, MD   Attending Provider(s) Admitting Provider PCP   Rochel Brome, DO; Sheppard Coil, MD; Canary Brim, DO; Lisa Roca, MD Sheppard Coil, MD Horton Marshall, MD     Verbal Order Info    Action Created on Order Mode Entered by Responsible Provider Signed by Signed on   Ordering 02/08/21 [phone] Telephone with Susanne Borders, RN Lisa Roca, MD             Comments    Dr. Horton Marshall, PCP to follow     Hospital Problem List:   Hypertensive emergency     -Likely secondary to fluid overload and missing dialysis and questionable compliance with medications   End-stage renal disease on dialysis   Flash pulmonary edema/fluid overload   Secondary to hypertensive urgency and missing dialysis   Acute respiratory failure with  hypoxia   Secondary to fluid overload/acute bronchitis   Acute bronchitis   Diabetes mellitus with hyperglycemia   COPD       Home nursing required for skilled assessment including cardiopulmonary assessment and dietary education for disease management, and medication instruction. Home PT/OT required for gait and balance training, strengthening, mobility, fall prevention, and ADL training.                Home Health face-to-face (FTF) Encounter: Patient Communication     Not Released  Not seen         Order Questions    Question Answer   Date I saw the patient face-to-face: 02/08/2021   Evidence this patient is homebound because: C.  Decreased endurance, strength, ROM, cadence, safety/judgment during mobility    E.  Requires assistance of another person or assistive device  to ambulate > 5 feet    F.  Deconditioned due to advance disease process requiring assistance to leave home    G.  Fall risk due to impaired coordination, gait and decreased balance    N.  Impaired mobility d/t pain, arthritis, weakness that compromises patient safety    L.  Transfer/ambulation requires stand by assist and verbal cues to perform safely   Medical conditions that necessitate Home Health care: B.  Functional impairment due to recent hospitalization/procedure/treatment    C.  Risk for complication/infection/pain requiring follow up and monitoring    D.  Chronic illness & risk for re-hospitalization due to unstable disease status    E.  Exacerbation of disease requiring follow up monitoring    F.  New diagnosis & treatment requiring follow up monitoring and management   Per clinical findings, following services are medically necessary: Skilled Nursing    PT    OT   Clinical findings that support the need for Skilled Nursing. SN will: C. Monitor for signs and symptoms of exacerbation of disease and management    D. Review medication reconciliation, manage and educate on use and side effects    G. Educate on new diagnosis, treatment & management to prevent re-hospitalization    H. Assess cardiopulmonary status and monitor for signs &symptoms of exacerbation    I.  Educate dietary and or fluid restrictions and weight management    M. Instruct use of inhalers and/ or nebulizers, incentive spirometer    N.  Instruct on oxygen use and safety   Clinical findings that support the need for Physical Therapy. PT will A.  Evaluate and treat functional impairment and improve mobility    C.  Educate on weight bearing status, stair/gait training, balance & coordination    D.  Provide services to help restore function, mobility, and releive pain    E.  Educate on functional mobility; bed, chair, sit, stand and transfer activities    F.  Perform home safety assessment & develop safe in home exercise program    G.   Implement activities to improve stance time, cadence & step length    H.  Educate on the safe use of assistive device/ durable medical equipment    I.  Instruct on restorative activities to restore ability to perform ADL   OT will provide assistance with: Home program to improve ability to perform ADLs    Recovery and maintenance skills    Basic motor function and reasoning abilities    Strategies to compensate for loss of function    Restorative program to improve mobility and independence  Process Instructions    Please select Home Care Services medically necessary.     Based on the above findings, I certify that this patient is confined to the home and needs intermittent skilled nursing care, physical therapry and / or speech therapy or continues to need occupational therapy. The patient is under my care, and I have initiated the establishment of the plan of care. This patient will be followed by a physician who will periodically review the plan of care.      Collection Information            Consult Order Info    ID Description Priority Start Date Start Time   [id number] [place] face-to-face (FTF) Encounter Routine 02/08/2021  8:54 AM   Provider Specialty Referred to   ______________________________________ _____________________________________                         Verbal Order Info    Action Created on Order Mode Entered by Responsible Provider Signed by Signed on   Ordering 02/08/21 [phone] Telephone with Susanne Borders, RN [place], Lawerance Sabal, MD             Patient Information    Patient Name   [name], [name] Legal Sex   Female DOB   12-26-1955       Reprint Order [place] face-to-face (FTF) Encounter (Order #[id number]) on 02/08/21       Additional Information    Associated Reports External References   Priority and Order Details [facility]

## 2021-03-03 ENCOUNTER — Observation Stay
Admission: EM | Admit: 2021-03-03 | Discharge: 2021-03-06 | Disposition: A | Discharge status: Home / Self Care | Payer: 59 | Attending: Internal Medicine | Admitting: Internal Medicine

## 2021-03-03 DIAGNOSIS — S90422A Blister (nonthermal), left great toe, initial encounter: Secondary | ICD-10-CM | POA: Insufficient documentation

## 2021-03-03 DIAGNOSIS — Z794 Long term (current) use of insulin: Secondary | ICD-10-CM | POA: Insufficient documentation

## 2021-03-03 DIAGNOSIS — Z91199 Patient's noncompliance with other medical treatment and regimen due to unspecified reason: Secondary | ICD-10-CM | POA: Insufficient documentation

## 2021-03-03 DIAGNOSIS — I160 Hypertensive urgency: Secondary | ICD-10-CM | POA: Insufficient documentation

## 2021-03-03 DIAGNOSIS — E785 Hyperlipidemia, unspecified: Secondary | ICD-10-CM | POA: Insufficient documentation

## 2021-03-03 DIAGNOSIS — E1165 Type 2 diabetes mellitus with hyperglycemia: Secondary | ICD-10-CM | POA: Insufficient documentation

## 2021-03-03 DIAGNOSIS — R778 Other specified abnormalities of plasma proteins: Secondary | ICD-10-CM | POA: Insufficient documentation

## 2021-03-03 DIAGNOSIS — E1140 Type 2 diabetes mellitus with diabetic neuropathy, unspecified: Secondary | ICD-10-CM | POA: Insufficient documentation

## 2021-03-03 DIAGNOSIS — E1122 Type 2 diabetes mellitus with diabetic chronic kidney disease: Secondary | ICD-10-CM | POA: Insufficient documentation

## 2021-03-03 DIAGNOSIS — E875 Hyperkalemia: Principal | ICD-10-CM | POA: Insufficient documentation

## 2021-03-03 DIAGNOSIS — Z9115 Patient's noncompliance with renal dialysis: Secondary | ICD-10-CM | POA: Insufficient documentation

## 2021-03-03 DIAGNOSIS — N186 End stage renal disease: Secondary | ICD-10-CM | POA: Insufficient documentation

## 2021-03-03 DIAGNOSIS — S90421A Blister (nonthermal), right great toe, initial encounter: Secondary | ICD-10-CM | POA: Insufficient documentation

## 2021-03-03 DIAGNOSIS — J449 Chronic obstructive pulmonary disease, unspecified: Secondary | ICD-10-CM | POA: Insufficient documentation

## 2021-03-03 DIAGNOSIS — Z992 Dependence on renal dialysis: Secondary | ICD-10-CM | POA: Insufficient documentation

## 2021-03-03 DIAGNOSIS — L089 Local infection of the skin and subcutaneous tissue, unspecified: Secondary | ICD-10-CM | POA: Insufficient documentation

## 2021-03-03 DIAGNOSIS — Z79899 Other long term (current) drug therapy: Secondary | ICD-10-CM | POA: Insufficient documentation

## 2021-03-03 DIAGNOSIS — E8770 Fluid overload, unspecified: Secondary | ICD-10-CM | POA: Insufficient documentation

## 2021-03-03 DIAGNOSIS — Z7982 Long term (current) use of aspirin: Secondary | ICD-10-CM | POA: Insufficient documentation

## 2021-03-03 DIAGNOSIS — L603 Nail dystrophy: Secondary | ICD-10-CM | POA: Insufficient documentation

## 2021-03-03 DIAGNOSIS — D631 Anemia in chronic kidney disease: Secondary | ICD-10-CM | POA: Insufficient documentation

## 2021-03-03 DIAGNOSIS — N2581 Secondary hyperparathyroidism of renal origin: Secondary | ICD-10-CM | POA: Insufficient documentation

## 2021-03-03 DIAGNOSIS — R000 Tachycardia, unspecified: Secondary | ICD-10-CM

## 2021-03-03 DIAGNOSIS — I120 Hypertensive chronic kidney disease with stage 5 chronic kidney disease or end stage renal disease: Secondary | ICD-10-CM | POA: Insufficient documentation

## 2021-03-03 DIAGNOSIS — J9621 Acute and chronic respiratory failure with hypoxia: Secondary | ICD-10-CM | POA: Insufficient documentation

## 2021-03-03 DIAGNOSIS — Z20822 Contact with and (suspected) exposure to covid-19: Secondary | ICD-10-CM | POA: Insufficient documentation

## 2021-03-03 DIAGNOSIS — R0602 Shortness of breath: Secondary | ICD-10-CM

## 2021-03-03 DIAGNOSIS — G4733 Obstructive sleep apnea (adult) (pediatric): Secondary | ICD-10-CM | POA: Insufficient documentation

## 2021-03-03 LAB — GLUCOSE WHOLE BLOOD - POCT: Whole Blood Glucose POCT: 434 mg/dL — ABNORMAL HIGH (ref 70–100)

## 2021-03-03 NOTE — EDIE (Signed)
COLLECTIVE?NOTIFICATION?03/03/2021 22:38?[name], [name]?MRN: [id number]    Criteria Met      5 ED Visits in 12 Months    Security and Safety  No Security Events were found.  ED Care Guidelines  There are currently no ED Care Guidelines for this patient. Please check your facility's medical records system.        Prescription Monitoring Program  270??- Narcotic Use Score  280??- Sedative Use Score  000??- Stimulant Use Score  130??- Overdose Risk Score  - All Scores range from 000-999 with 75% of the population scoring < 200 and on 1% scoring above 650  - The last digit of the narcotic, sedative, and stimulant score indicates the number of active prescriptions of that type  - Higher Use scores correlate with increased prescribers, pharmacies, mg equiv, and overlapping prescriptions  - Higher Overdose Risk Scores correlate with increased risk of unintentional overdose death   Concerning or unexpectedly high scores should prompt a review of the PMP record; this does not constitute checking PMP for prescribing purposes.    E.D. Visit Count (12 mo.)  Facility Visits   [facility] 5   Total 5   Note: Visits indicate total known visits.     Recent Emergency Department Visit Summary  Date Facility [facility] Type Diagnoses or Chief Complaint    Mar 03, 2021  [place].  [place]  [place]  Emergency      SOB x 1 week      Feb 04, 2021  [place].  [place]  [place]  Emergency      SOB      Difficulty breathing      Cough      Hypertensive emergency      Jan 05, 2021  [facility]  [place].  [place]  Emergency      Loss of appetite      Shortness of Breath      Diarrhea      Fluid overload, unspecified      Nov 12, 2020  [place].  [place].  [place]  Emergency      "flu-like"sx,N,V,D x 3 days      Missed Dialysis      Flu like symptoms      End stage renal disease      Oct 20, 2020  [place].  [place].  [place]  Emergency      hyperglycemia      Fall      Wrist Pain      Hip Pain       Shortness of breath        Recent Inpatient Visit Summary  Date [place] Type Diagnoses or Chief Complaint    Jan 05, 2021  [place].  [place]  [place]  Medical Surgical      Fluid overload, unspecified      Nov 12, 2020  [place].  [place]  [place]  Medical Surgical      End stage renal disease      Hyperkalemia      Myalgia, unspecified site      Diarrhea, unspecified      Vomiting, unspecified      Oct 20, 2020  [place].  [place]  [place]  Medical Surgical      Shortness of breath      End stage renal disease  Hypoxemia        Care Team  Provider Specialty Phone Fax Service Dates   Hazeline Junker Case Manager/Care Coordinator [phone]  Current    Theone Murdoch, DNP Nurse Practitioner: Family [phone] Current    PHAM, TRI M, MD Internal Medicine   Current      Collective Portal  This patient has registered at the [facility] Emergency Department   For more information visit: [email or web address]     PLEASE NOTE:     1.   Any care recommendations and other clinical information are provided as guidelines or for historical purposes only, and providers should exercise their own clinical judgment when providing care.    2.   You may only use this information for purposes of treatment, payment or health care operations activities, and subject to the limitations of applicable [employer] Polic[employer].    3.   You should consult directly with the organization that provided a care guideline or other clinical history with any questions about additional information or accuracy or completeness of information provided.    ? 2023 [employer] - [email or web address]/

## 2021-03-04 ENCOUNTER — Emergency Department: Payer: 59

## 2021-03-04 ENCOUNTER — Encounter: Payer: Self-pay | Admitting: Internal Medicine

## 2021-03-04 DIAGNOSIS — E875 Hyperkalemia: Secondary | ICD-10-CM

## 2021-03-04 LAB — BLOOD GAS, ARTERIAL
Arterial Total CO2: 38.6 mEq/L — ABNORMAL HIGH (ref 24.0–30.0)
Base Excess, Arterial: -6.8 mEq/L — ABNORMAL LOW (ref <=2.0)
FIO2: 28 %
HCO3, Arterial: 18.5 mEq/L — ABNORMAL LOW (ref 23.0–29.0)
O2 Flow: 2 L/min
O2 Sat, Arterial: 96.2 % (ref 95.0–100.0)
Temperature: 37
pCO2, Arterial: 38.1 mmhg (ref 35.0–45.0)
pH, Arterial: 7.307 — ABNORMAL LOW (ref 7.350–7.450)
pO2, Arterial: 93.7 mmhg — ABNORMAL HIGH (ref 80.0–90.0)

## 2021-03-04 LAB — BASIC METABOLIC PANEL
Anion Gap: 17 — ABNORMAL HIGH (ref 5.0–15.0)
Anion Gap: 14 (ref 5.0–15.0)
BUN: 99 mg/dL — ABNORMAL HIGH (ref 7.0–21.0)
BUN: 55 mg/dL — ABNORMAL HIGH (ref 7.0–21.0)
CO2: 19 mEq/L (ref 17–29)
CO2: 23 mEq/L (ref 17–29)
Calcium: 7.9 mg/dL — ABNORMAL LOW (ref 8.5–10.5)
Calcium: 7.8 mg/dL — ABNORMAL LOW (ref 8.5–10.5)
Chloride: 100 mEq/L (ref 99–111)
Chloride: 101 mEq/L (ref 99–111)
Creatinine: 11.4 mg/dL — ABNORMAL HIGH (ref 0.4–1.0)
Creatinine: 7.6 mg/dL — ABNORMAL HIGH (ref 0.4–1.0)
Glucose: 517 mg/dL (ref 70–100)
Glucose: 343 mg/dL — ABNORMAL HIGH (ref 70–100)
Potassium: 6.2 mEq/L (ref 3.5–5.3)
Potassium: 4 mEq/L (ref 3.5–5.3)
Sodium: 136 mEq/L (ref 135–145)
Sodium: 138 mEq/L (ref 135–145)

## 2021-03-04 LAB — CBC AND DIFFERENTIAL
Absolute NRBC: 0 10*3/uL (ref 0.00–0.00)
Absolute NRBC: 0 10*3/uL (ref 0.00–0.00)
Basophils Absolute Automated: 0.03 10*3/uL (ref 0.00–0.08)
Basophils Absolute Automated: 0.03 10*3/uL (ref 0.00–0.08)
Basophils Automated: 0.6 %
Basophils Automated: 0.6 %
Eosinophils Absolute Automated: 0.08 10*3/uL (ref 0.00–0.44)
Eosinophils Absolute Automated: 0.07 10*3/uL (ref 0.00–0.44)
Eosinophils Automated: 1.5 %
Eosinophils Automated: 1.3 %
Hematocrit: 38.4 % (ref 34.7–43.7)
Hematocrit: 37.3 % (ref 34.7–43.7)
Hgb: 11.6 g/dL (ref 11.4–14.8)
Hgb: 11.3 g/dL — ABNORMAL LOW (ref 11.4–14.8)
Immature Granulocytes Absolute: 0.02 10*3/uL (ref 0.00–0.07)
Immature Granulocytes Absolute: 0.01 10*3/uL (ref 0.00–0.07)
Immature Granulocytes: 0.4 %
Immature Granulocytes: 0.2 %
Instrument Absolute Neutrophil Count: 3.66 10*3/uL (ref 1.10–6.33)
Instrument Absolute Neutrophil Count: 3.65 10*3/uL (ref 1.10–6.33)
Lymphocytes Absolute Automated: 0.97 10*3/uL (ref 0.42–3.22)
Lymphocytes Absolute Automated: 1.04 10*3/uL (ref 0.42–3.22)
Lymphocytes Automated: 18.1 %
Lymphocytes Automated: 19.2 %
MCH: 28.4 pg (ref 25.1–33.5)
MCH: 28.6 pg (ref 25.1–33.5)
MCHC: 30.2 g/dL — ABNORMAL LOW (ref 31.5–35.8)
MCHC: 30.3 g/dL — ABNORMAL LOW (ref 31.5–35.8)
MCV: 94.1 fL (ref 78.0–96.0)
MCV: 94.4 fL (ref 78.0–96.0)
MPV: 13.2 fL — ABNORMAL HIGH (ref 8.9–12.5)
MPV: 12.5 fL (ref 8.9–12.5)
Monocytes Absolute Automated: 0.6 10*3/uL (ref 0.21–0.85)
Monocytes Absolute Automated: 0.63 10*3/uL (ref 0.21–0.85)
Monocytes: 11.2 %
Monocytes: 11.6 %
Neutrophils Absolute: 3.66 10*3/uL (ref 1.10–6.33)
Neutrophils Absolute: 3.65 10*3/uL (ref 1.10–6.33)
Neutrophils: 68.2 %
Neutrophils: 67.1 %
Nucleated RBC: 0 /100 WBC (ref 0.0–0.0)
Nucleated RBC: 0 /100 WBC (ref 0.0–0.0)
Platelets: 90 10*3/uL — ABNORMAL LOW (ref 142–346)
Platelets: 92 10*3/uL — ABNORMAL LOW (ref 142–346)
RBC: 4.08 10*6/uL (ref 3.90–5.10)
RBC: 3.95 10*6/uL (ref 3.90–5.10)
RDW: 14 % (ref 11–15)
RDW: 14 % (ref 11–15)
WBC: 5.36 10*3/uL (ref 3.10–9.50)
WBC: 5.43 10*3/uL (ref 3.10–9.50)

## 2021-03-04 LAB — GLUCOSE WHOLE BLOOD - POCT
Whole Blood Glucose POCT: 358 mg/dL — ABNORMAL HIGH (ref 70–100)
Whole Blood Glucose POCT: 371 mg/dL — ABNORMAL HIGH (ref 70–100)
Whole Blood Glucose POCT: 263 mg/dL — ABNORMAL HIGH (ref 70–100)
Whole Blood Glucose POCT: 357 mg/dL — ABNORMAL HIGH (ref 70–100)
Whole Blood Glucose POCT: 300 mg/dL — ABNORMAL HIGH (ref 70–100)
Whole Blood Glucose POCT: 206 mg/dL — ABNORMAL HIGH (ref 70–100)

## 2021-03-04 LAB — PT AND APTT
PT INR: 1.2 — ABNORMAL HIGH (ref 0.9–1.1)
PT: 13.6 s — ABNORMAL HIGH (ref 10.1–12.9)
PTT: 30 s (ref 27–39)

## 2021-03-04 LAB — HEPATITIS B SURFACE ANTIGEN W/ REFLEX TO CONFIRMATION: Hepatitis B Surface Antigen: NONREACTIVE

## 2021-03-04 LAB — COMPREHENSIVE METABOLIC PANEL
ALT: 20 U/L (ref 0–55)
AST (SGOT): 14 U/L (ref 5–41)
Albumin/Globulin Ratio: 0.8 — ABNORMAL LOW (ref 0.9–2.2)
Albumin: 2.8 g/dL — ABNORMAL LOW (ref 3.5–5.0)
Alkaline Phosphatase: 203 U/L — ABNORMAL HIGH (ref 37–117)
Anion Gap: 15 (ref 5.0–15.0)
BUN: 99 mg/dL — ABNORMAL HIGH (ref 7.0–21.0)
Bilirubin, Total: 0.4 mg/dL (ref 0.2–1.2)
CO2: 18 mEq/L (ref 17–29)
Calcium: 7.7 mg/dL — ABNORMAL LOW (ref 8.5–10.5)
Chloride: 102 mEq/L (ref 99–111)
Creatinine: 11.4 mg/dL — ABNORMAL HIGH (ref 0.4–1.0)
Globulin: 3.3 g/dL (ref 2.0–3.6)
Glucose: 551 mg/dL (ref 70–100)
Potassium: 6.9 mEq/L (ref 3.5–5.3)
Protein, Total: 6.1 g/dL (ref 6.0–8.3)
Sodium: 135 mEq/L (ref 135–145)

## 2021-03-04 LAB — LACTIC ACID, PLASMA
Lactic Acid: 1.3 mmol/L (ref 0.2–2.0)
Lactic Acid: 1.4 mmol/L (ref 0.2–2.0)

## 2021-03-04 LAB — GFR
EGFR: 4
EGFR: 4
EGFR: 6.5

## 2021-03-04 LAB — HIGH SENSITIVITY TROPONIN-I: hs Troponin-I: 102 ng/L — CR

## 2021-03-04 LAB — CELL MORPHOLOGY
Cell Morphology: NORMAL
Platelet Estimate: DECREASED — AB

## 2021-03-04 LAB — COVID-19 (SARS-COV-2) & INFLUENZA  A/B, NAA (ROCHE LIAT)
Influenza A: NOT DETECTED
Influenza B: NOT DETECTED
SARS CoV 2 Overall Result: NOT DETECTED

## 2021-03-04 MED ORDER — INSULIN LISPRO 100 UNIT/ML SOLN (WRAP)
1.0000 [IU] | Freq: Three times a day (TID) | Status: DC
Start: 2021-03-04 — End: 2021-03-04

## 2021-03-04 MED ORDER — HYDRALAZINE HCL 20 MG/ML IJ SOLN
10.0000 mg | Freq: Four times a day (QID) | INTRAMUSCULAR | Status: DC | PRN
Start: 2021-03-04 — End: 2021-03-06

## 2021-03-04 MED ORDER — ALBUTEROL SULFATE (2.5 MG/3ML) 0.083% IN NEBU
5.0000 mg | INHALATION_SOLUTION | Freq: Once | RESPIRATORY_TRACT | Status: AC
Start: 2021-03-04 — End: 2021-03-04
  Administered 2021-03-04: 5 mg via RESPIRATORY_TRACT
  Filled 2021-03-04: qty 6

## 2021-03-04 MED ORDER — LABETALOL HCL 5 MG/ML IV SOLN (WRAP)
10.0000 mg | Freq: Once | INTRAVENOUS | Status: AC
Start: 2021-03-04 — End: 2021-03-04
  Administered 2021-03-04: 10 mg via INTRAVENOUS
  Filled 2021-03-04: qty 4

## 2021-03-04 MED ORDER — DEXTROSE 10 % IV BOLUS
12.5000 g | INTRAVENOUS | Status: DC | PRN
Start: 2021-03-04 — End: 2021-03-06

## 2021-03-04 MED ORDER — HYDRALAZINE HCL 20 MG/ML IJ SOLN
10.0000 mg | Freq: Once | INTRAMUSCULAR | Status: AC
Start: 2021-03-04 — End: 2021-03-04
  Administered 2021-03-04: 10 mg via INTRAVENOUS
  Filled 2021-03-04: qty 1

## 2021-03-04 MED ORDER — ONDANSETRON HCL 4 MG/2ML IJ SOLN
4.0000 mg | INTRAMUSCULAR | Status: AC | PRN
Start: 2021-03-04 — End: 2021-03-04

## 2021-03-04 MED ORDER — SENNOSIDES-DOCUSATE SODIUM 8.6-50 MG PO TABS
1.0000 | ORAL_TABLET | Freq: Two times a day (BID) | ORAL | Status: DC
Start: 2021-03-04 — End: 2021-03-06
  Administered 2021-03-04 – 2021-03-06 (×3): 1 via ORAL
  Filled 2021-03-04 (×3): qty 1

## 2021-03-04 MED ORDER — DEXTROSE 10 % IV BOLUS
50.0000 g | Freq: Once | INTRAVENOUS | Status: DC
Start: 2021-03-04 — End: 2021-03-04

## 2021-03-04 MED ORDER — DEXTROSE 50 % IV SOLN
12.5000 g | INTRAVENOUS | Status: DC | PRN
Start: 2021-03-04 — End: 2021-03-04

## 2021-03-04 MED ORDER — INSULIN LISPRO 100 UNIT/ML SOLN (WRAP)
1.0000 [IU] | Freq: Every evening | Status: DC
Start: 2021-03-04 — End: 2021-03-05
  Administered 2021-03-04: 2 [IU] via SUBCUTANEOUS
  Filled 2021-03-04: qty 6

## 2021-03-04 MED ORDER — METHYLPREDNISOLONE SODIUM SUCC 125 MG IJ SOLR
125.0000 mg | Freq: Once | INTRAMUSCULAR | Status: AC
Start: 2021-03-04 — End: 2021-03-04
  Administered 2021-03-04: 125 mg via INTRAVENOUS
  Filled 2021-03-04: qty 2

## 2021-03-04 MED ORDER — INSULIN SYRINGES (DISPOSABLE) U-100 1 ML MISC
5.0000 [IU] | Freq: Once | Status: DC
Start: 2021-03-04 — End: 2021-03-05

## 2021-03-04 MED ORDER — ASPIRIN 81 MG PO CHEW
81.0000 mg | CHEWABLE_TABLET | Freq: Every day | ORAL | Status: DC
Start: 2021-03-04 — End: 2021-03-06
  Administered 2021-03-04 – 2021-03-06 (×3): 81 mg via ORAL
  Filled 2021-03-04 (×3): qty 1

## 2021-03-04 MED ORDER — CALCIUM GLUCONATE-NACL 1-0.675 GM/50ML-% IV SOLN
1.0000 g | Freq: Once | INTRAVENOUS | Status: DC
Start: 2021-03-04 — End: 2021-03-06

## 2021-03-04 MED ORDER — PANTOPRAZOLE SODIUM 40 MG PO TBEC
40.0000 mg | DELAYED_RELEASE_TABLET | Freq: Every morning | ORAL | Status: DC
Start: 2021-03-04 — End: 2021-03-06
  Administered 2021-03-04 – 2021-03-06 (×3): 40 mg via ORAL
  Filled 2021-03-04 (×3): qty 1

## 2021-03-04 MED ORDER — ACETAMINOPHEN 325 MG PO TABS
650.0000 mg | ORAL_TABLET | Freq: Four times a day (QID) | ORAL | Status: DC | PRN
Start: 2021-03-04 — End: 2021-03-04

## 2021-03-04 MED ORDER — GLUCOSE 40 % PO GEL (WRAP)
15.0000 g | ORAL | Status: DC | PRN
Start: 2021-03-04 — End: 2021-03-06

## 2021-03-04 MED ORDER — DEXTROSE 50 % IV SOLN
12.5000 g | INTRAVENOUS | Status: DC | PRN
Start: 2021-03-04 — End: 2021-03-06

## 2021-03-04 MED ORDER — CARVEDILOL 12.5 MG PO TABS
12.5000 mg | ORAL_TABLET | Freq: Two times a day (BID) | ORAL | Status: DC
Start: 2021-03-04 — End: 2021-03-06
  Administered 2021-03-04 – 2021-03-06 (×5): 12.5 mg via ORAL
  Filled 2021-03-04 (×5): qty 1

## 2021-03-04 MED ORDER — ATORVASTATIN CALCIUM 40 MG PO TABS
40.0000 mg | ORAL_TABLET | Freq: Every day | ORAL | Status: DC
Start: 2021-03-04 — End: 2021-03-06
  Administered 2021-03-04 – 2021-03-06 (×3): 40 mg via ORAL
  Filled 2021-03-04 (×3): qty 1

## 2021-03-04 MED ORDER — SODIUM POLYSTYRENE SULFONATE 15 GM/60ML PO SUSP
15.0000 g | Freq: Once | ORAL | Status: AC
Start: 2021-03-04 — End: 2021-03-04
  Administered 2021-03-04: 15 g via ORAL
  Filled 2021-03-04: qty 60

## 2021-03-04 MED ORDER — INSULIN GLARGINE 100 UNIT/ML SC SOLN
20.0000 [IU] | Freq: Every day | SUBCUTANEOUS | Status: DC
Start: 2021-03-04 — End: 2021-03-06
  Administered 2021-03-04 – 2021-03-06 (×3): 20 [IU] via SUBCUTANEOUS
  Filled 2021-03-04 (×3): qty 20

## 2021-03-04 MED ORDER — ENOXAPARIN SODIUM 30 MG/0.3ML IJ SOSY
30.0000 mg | PREFILLED_SYRINGE | Freq: Every day | INTRAMUSCULAR | Status: DC
Start: 2021-03-04 — End: 2021-03-04

## 2021-03-04 MED ORDER — OXYCODONE HCL 5 MG PO TABS
5.0000 mg | ORAL_TABLET | ORAL | Status: DC | PRN
Start: 2021-03-04 — End: 2021-03-06
  Administered 2021-03-06: 5 mg via ORAL
  Filled 2021-03-04: qty 1

## 2021-03-04 MED ORDER — INSULIN LISPRO 100 UNIT/ML SOLN (WRAP)
1.0000 [IU] | Status: DC
Start: 2021-03-04 — End: 2021-03-04
  Administered 2021-03-04: 8 [IU] via SUBCUTANEOUS
  Administered 2021-03-04: 5 [IU] via SUBCUTANEOUS
  Filled 2021-03-04: qty 12
  Filled 2021-03-04: qty 24

## 2021-03-04 MED ORDER — GLUCAGON 1 MG IJ SOLR (WRAP)
1.0000 mg | INTRAMUSCULAR | Status: DC | PRN
Start: 2021-03-04 — End: 2021-03-06

## 2021-03-04 MED ORDER — INSULIN LISPRO 100 UNIT/ML SOLN (WRAP)
2.0000 [IU] | Freq: Three times a day (TID) | Status: DC
Start: 2021-03-04 — End: 2021-03-05
  Administered 2021-03-04: 10 [IU] via SUBCUTANEOUS
  Administered 2021-03-04 – 2021-03-05 (×2): 6 [IU] via SUBCUTANEOUS
  Filled 2021-03-04: qty 9
  Filled 2021-03-04: qty 30
  Filled 2021-03-04: qty 18

## 2021-03-04 MED ORDER — INSULIN SYRINGES (DISPOSABLE) U-100 1 ML MISC
5.0000 [IU] | Freq: Once | Status: AC
Start: 2021-03-04 — End: 2021-03-04
  Administered 2021-03-04: 5 [IU] via INTRAVENOUS
  Filled 2021-03-04: qty 15

## 2021-03-04 MED ORDER — GLUCAGON 1 MG IJ SOLR (WRAP)
1.0000 mg | INTRAMUSCULAR | Status: DC | PRN
Start: 2021-03-04 — End: 2021-03-04

## 2021-03-04 MED ORDER — AQUAPHOR EX OINT
TOPICAL_OINTMENT | Freq: Two times a day (BID) | CUTANEOUS | Status: DC
Start: 2021-03-04 — End: 2021-03-06
  Filled 2021-03-04 (×2): qty 50

## 2021-03-04 MED ORDER — SODIUM BICARBONATE 8.4 % IV SOLN
50.0000 meq | Freq: Once | INTRAVENOUS | Status: DC
Start: 2021-03-04 — End: 2021-03-06

## 2021-03-04 MED ORDER — SODIUM CHLORIDE 0.9 % IV BOLUS
100.0000 mL | INTRAVENOUS | Status: AC | PRN
Start: 2021-03-04 — End: 2021-03-04

## 2021-03-04 MED ORDER — SODIUM BICARBONATE 8.4 % IV SOLN
50.0000 meq | Freq: Once | INTRAVENOUS | Status: AC
Start: 2021-03-04 — End: 2021-03-04
  Administered 2021-03-04: 50 meq via INTRAVENOUS
  Filled 2021-03-04: qty 50

## 2021-03-04 MED ORDER — AMLODIPINE BESYLATE 5 MG PO TABS
10.0000 mg | ORAL_TABLET | Freq: Every day | ORAL | Status: DC
Start: 2021-03-04 — End: 2021-03-06
  Administered 2021-03-04 – 2021-03-06 (×3): 10 mg via ORAL
  Filled 2021-03-04 (×3): qty 2

## 2021-03-04 MED ORDER — ACETAMINOPHEN 325 MG PO TABS
650.0000 mg | ORAL_TABLET | Freq: Four times a day (QID) | ORAL | Status: DC | PRN
Start: 2021-03-04 — End: 2021-03-06

## 2021-03-04 MED ORDER — ONDANSETRON 4 MG PO TBDP
4.0000 mg | ORAL_TABLET | ORAL | Status: AC | PRN
Start: 2021-03-04 — End: 2021-03-04

## 2021-03-04 MED ORDER — POTASSIUM CHLORIDE 10 MEQ/100ML IV SOLN (WRAP)
10.0000 meq | INTRAVENOUS | Status: AC
Start: 2021-03-04 — End: 2021-03-04

## 2021-03-04 MED ORDER — SODIUM CHLORIDE 0.9 % IV BOLUS
250.0000 mL | INTRAVENOUS | Status: AC | PRN
Start: 2021-03-04 — End: 2021-03-04

## 2021-03-04 MED ORDER — HYDRALAZINE HCL 20 MG/ML IJ SOLN
10.0000 mg | Freq: Four times a day (QID) | INTRAMUSCULAR | Status: DC | PRN
Start: 2021-03-04 — End: 2021-03-04
  Filled 2021-03-04: qty 1

## 2021-03-04 MED ORDER — CALCIUM GLUCONATE 10 % IV SOLN
1.0000 g | Freq: Once | INTRAVENOUS | Status: AC
Start: 2021-03-04 — End: 2021-03-04
  Administered 2021-03-04: 1 g via INTRAVENOUS
  Filled 2021-03-04: qty 10

## 2021-03-04 MED ORDER — HEPARIN SODIUM (PORCINE) 5000 UNIT/ML IJ SOLN
5000.0000 [IU] | Freq: Three times a day (TID) | INTRAMUSCULAR | Status: DC
Start: 2021-03-04 — End: 2021-03-06
  Administered 2021-03-04 – 2021-03-06 (×7): 5000 [IU] via SUBCUTANEOUS
  Filled 2021-03-04 (×7): qty 1

## 2021-03-04 MED ORDER — GLUCOSE 40 % PO GEL (WRAP)
15.0000 g | ORAL | Status: DC | PRN
Start: 2021-03-04 — End: 2021-03-04

## 2021-03-04 MED ORDER — ALBUMIN HUMAN 25 % IV SOLN
100.0000 mL | INTRAVENOUS | Status: AC | PRN
Start: 2021-03-04 — End: 2021-03-04
  Filled 2021-03-04: qty 100

## 2021-03-04 MED ORDER — DEXTROSE 10 % IV BOLUS
12.5000 g | INTRAVENOUS | Status: DC | PRN
Start: 2021-03-04 — End: 2021-03-04

## 2021-03-04 NOTE — Progress Notes (Signed)
Initial Case Management Assessment and Discharge [place]   Patient Name: [name], [name]   Date of Birth [birth year]   Attending Physician: Carmine Savoy, Edwin Dada, MD   Primary Care Physician: Horton Marshall, MD   Length of Stay 0   Reason for Consult / Chief Complaint Initial assessment        Situation   Admission DX:   1. Hyperkalemia    2. ESRD (end stage renal disease) on dialysis    3. SOB (shortness of breath)    4. Elevated troponin    5. Non compliance with medical treatment    6. Hypertensive urgency        A/O Status: X 3    LACE Score: 13    Patient admitted from: ER  Admission Status: observation    Health Care Agent: Self     Background     Advanced directive:   <no information>    Code Status:   Full Code     Residence: One story home/apartment with son and has elevator access    PCP: Horton Marshall, MD  Patient Contact:   [phone] (home)     [phone] (mobile)     Emergency contact:   Extended Emergency Contact Information  Primary Emergency Contact: okoronwo,shannon  Mobile Phone: [phone]  Relation: Daughter  Interpreter needed? No  Secondary Emergency Contact: [place] Sr  Mobile Phone: [phone]  Relation: Spouse      ADL/IADL's: Assistive Device and Needs Assist  Previous Level of function: 6 Modified Independent     DME: Home oxygen  Per chart uses a cane/walker    Pharmacy:     [id number] - [address], [address]  [place] [address]  Phone: [phone] Fax: [phone]      Prescription Coverage: Yes    Home Health: Patient receiving home health via [facility] agency.  SN, PT/OT.    COVID Vaccine Status: unknown    UAI on file?: Yes  Transport for discharge? Mode of transportation: Sports coach - Family/Friend to drive patient  Agreeable to Home with family and [place] post-discharge:  Yes     Assessment   Assessment completed with patient.  Observed as sleepy but answers  appropriately.  Patient receives HD via [facility] M,W,F and reports reason for missing HD appointments as she "didn't feel like going".  Patient reports she transports to HD via cab.  Home services include an aide present 7 days/week, home O2 and home health PT/OT.  BARRIERS TO DISCHARGE: Medical stability, non-adherence     Recommendation   D/C Plan A: Home with home health

## 2021-03-04 NOTE — ED Notes (Signed)
Sono guided IV attempted to establish second IV access. Iv access unsuccessful. Blood obtained for cultures cbc and pt/aptt.

## 2021-03-04 NOTE — Progress Notes (Signed)
NURSING SHIFT NOTE     Patient: [name]  Day: 0      SHIFT EVENTS     Shift Narrative/Significant Events (PRN med administration, fall, RRT, etc.):   Pt was able to walk with PT 5 steps on the side of the bed with maximum assist. Pt was very sleep through the shift, MD Felipe Drone made aware. No new orders was given, also noted pt restless at times MD Felipe Drone made aware. Seen by OT. Refused to get OOB to chair. Denied any pain.     Safety and fall precautions remain in place. Purposeful rounding completed.          ASSESSMENT     Changes in assessment from patient's baseline this shift:    Neuro: No aox4. Denied any pain. PERRLA. MAE. Was able to stand at the bedside walked 5 steps with maximum assist with PT.   CV: No NSR on the monitor. Palpable pulses all throughout. Afebrile.  Pulm: No ON RA sating 95%. No sob noted  Peripheral Vascular: No  HEENT: No  GI: No  BM during shift: Yes   , Last BM: Last BM Date: 03/04/21  GU: Yes 250 ml clear urine. HD today 3.1 L removed.     Integ: No maroon discoloration on both great toe. Seen by WOCN. Cleansed. Applied aquaphor  MS: No    Pain: None  Pain Interventions: None  Medications Utilized: n/a    Mobility: PMP Activity: Step 5 - Chair of Distance Walked (ft) (Step 6,7): 0 Feet       p    Lines     Patient Lines/Drains/Airways Status       Active Lines, Drains and Airways       Name Placement date Placement time Site Days    Peripheral IV 03/04/21 20 G Anterior;Distal;Right Forearm 03/04/21  --  Forearm  less than 1    External Urinary Catheter 11/12/20  1330  --  112    Graft/Fistula Left --  --  -- --                         VITAL SIGNS     Vitals:    03/04/21 1600   BP:    Pulse:    Resp:    Temp: 98.4 F (36.9 C)   SpO2:        Temp  Min: 97.5 F (36.4 C)  Max: 98.6 F (37 C)  Pulse  Min: 69  Max: 96  Resp  Min: 11  Max: 34  BP  Min: 134/65  Max: 212/124  SpO2  Min: 85 %  Max: 100 %      Intake/Output Summary (Last 24 hours) at 03/04/2021 1823  Last data filed  at 03/04/2021 1821  Gross per 24 hour   Intake 240 ml   Output 3350 ml   Net -3110 ml              CARE PLAN

## 2021-03-04 NOTE — Treatment Plan (Cosign Needed)
Brief summary of why patient was admitted and any issues in the past 24 hours (1 min max). After the brief summary, then proceed to A FASTHUGS BID.   Activity   [x]  Y / []  N  PMP Activity: Step 3 - Bed Mobility (03/04/2021  4:00 AM)      Shift target level:    PT/OT Consult: []  Y / []  N   Mobility level:   PMP Contraindicated  Step 1 - Bedrest, Step 2 - Supine Exercise, Step 3- Bed Mobility, Step 4 - Dangle at Bedside,  Step 5 - Chair, Step 6 - Walks in Room,   Step - 7 Walks out of room   Feeding/Fluids     [x]  Y    /   []  N  Diet renal Protein restriction: 80 GM Protein  Diet NPO effective now  Supervise For Meals Frequency: All meals   No intake or output data in the 24 hours ending 03/04/21 0538    Diet ordered?  NPO?  Tube feedings initiated?  What kind? What rate?  Can they be advanced?  Need free water?  What IV fluids are running?  Is the I&O unbalanced? Mean arterial pressure >65? If patient receiving any vassopresor equivalent dose of less than 12.5 mcg/min  (Malnutrition leads to worse outcomes)       Analgesia  Not use for sedation     []  Y    /    [x]  N    /   []  NA    Pain/CPOT Score:   Pain Score: 0-No pain (Pain)    (CPOT) Is there adequate pain relief ordered?  Is there a pain plan of care? Can opioids be reduced, and/or replaced with non-opioid options?   (Pain affects psychological and physiological recovery)   Sedation/  Paralytic?   []  Y    /    []  N     /   [x]  NA  []  Y    /    []  N     /   [x]  NA    Target RASS Score:  Patient Actual RASS Score:   RASS Score: Alert and Calm    CAM-ICU: Delirium Present? []  Y    /    []  N  IF CAM Positive - use the acronym THINK  Positive or Negative for Delirium: Negative  SAT Performed: []  Y    /    []  N  /   []  NA   Are daily sedation vacations performed?  Is RASS 0 to -2 being maintained ("calm, comfortable, collaborative")?  (Over sedation is correlated with harmful effects)  THINK - Toxic situations and medications, Hypoxemia, Infection/Sepsis,  inflammation, immobilization, Non-pharmacological interventions, K+ or other electrolyte interventions     Thromboprophylaxis   []  Y    /    [x]  N    Last Anticoagulant Admin            Orders not given:    enoxaparin (LOVENOX) syringe 30 mg         Is lovenox or heparin in use to avoid DVT?  Are the coags abnormal?  Is anticoagulation contraindicated?  Why?  (DVT leads to increased mortality and morbidity)         HAPI Prevention     [x]  Y    /   []   N    Braden score:   Braden Scale Score: 18 Braden score? Does the patient need a specialty bed?  Are  prevention methods in place? Are skin checks being done under devices?  Are pictures taken of existing HAPIs?  (Prevention is the goal; HAPIs increase LOS and costs)     Ulcer Prevention [x]  Y    /  []  N  If indicated *ONLY INDICATED IF coagulopathy, mechanical ventilation for >48 hours, traumatic brain injury, history of GI ulceration or bleeding within past year, extensive burns   Glucose Control     [x]  Y    /  []   N  Recent Labs     03/04/21  0430 03/04/21  0300 03/04/21  0255 03/04/21  0007 03/03/21  2343   Glucose  --  517*  --  551*  --    Whole Blood Glucose POCT 371*  --  358*  --  434*      Are blood sugars under control (<180)?  Need insulin? Need long acting insulin if only sliding scale is ordered?  (Controlled glucose leads to shorter ICU stays) Any medications that can be adjusted? i.e. Steroids?    Spontaneous     Breathing Trial []  Y    /   []  N    /   [x]  NA    Did the patient pass the SAT today? Are they ready for CPAP? Is their sedation at a minimum?  Current vent settings? Vital signs and hemodynamics?  (Delayed weaning leads to complications, VAP, increased LOS)     Respiratory Status O2 Device:   O2 Device: Nasal cannula  Current Settings:     If COVID +, patient able to self-prone: []  Y /  [x]  N    Incentive Spirometer Volume:        Bowel Care   [x]  Y    /   []  N Bowel sounds? Passing flatus, BMs?  Distention present?  Need C.Diff culture?   Laxative? Vomiting?   (Diarrhea leads to electrolyte imbalance, dehydration, skin breakdown, delirium;  Constipation leads to discomfort, feeding intolerance, delirium)   Indwelling Catheter    Removal  Foley?  Central Line? []  Y    /  [x]  N     /   []  NA                 Vasopressors?  Can Foley be removed? Can we use Purewick or condom cath?    Can Central Line be removed?  Can Arterial Line be removed?  Do we need a PICC?  (Longer dwell time increase risk of infection, discomfort, and reduces mobility)     De-escalation of      Antibiotics   []  Y    /   [x]  N     /   []  NA What antibiotics are in use?  What are we treating? What culture/sensitivity results are available?   (Unnecessary antibiotic use promotes development of resistance   Miscellaneous Restraints []  Y / []  N  Palliative Care []  Y / []  N  Transfer or Discharge: []  Y / []  N  WRTC Trigger: []  Y / []  N  Family Conference: []  Y / []  N      Core Measures AMI:  [] ASA [] Beta-Blocker  CHF: [] EF% [] ACE or ARB  Stroke: [] PT/OT [] Swallow Screen   PNA: [] Immunization [] Bld culture w/in 24 hrs        Goals for today      Brief summary of patient goals that everyone agreed upon.    Participants Physician: []  Y / []  N  Nursing:[]  Y / []   N  Pharmacy: []  Y / []  N  Nutrition: []  Y / []  N  Case Management: []  Y / []  N  Respiratory: []  Y / []  N  Ethics:[]  Y / []  N  PT/OT: []  Y / []  N

## 2021-03-04 NOTE — OT Eval Note (Signed)
[facility]   Occupational Therapy Attempt Note    Patient:  [name] MRN#:  [id number]  Unit:  MED SURG ICU 1 Room/Bed:  G5498/Y6415-83      Occupational Therapy evaluation attempted on 03/04/2021 at 1:51 PM unable to complete secondary to patient eating.   Occupational therapy will follow up later today as able.    RN aware.     Rolin Barry [place], [place]  [phone]    Physical Medicine and [facility]    03/04/2021  1:51 PM

## 2021-03-04 NOTE — Treatment Plan (Cosign Needed)
FOUR EYES SKIN ASSESSMENT NOTE    ZAKIAH GAUTHREAUX  1955-05-25  [id number]    Braden Scale Score: 55    POC Initiated for Risk for Altered Skin Yes    Patient Assessed for Correct Mattress Surface Yes  *At risk patients with Braden Score less than 12 must be considered for specialty bed    Mepilex or Adhesive Foam Dressing applied to sacrum/heel if any PI risk factors present: Yes      If Wound/Pressure Injury present:  Wound/PI assessment documented in LDA (will be shown below): Yes  Admitting physician notified: No    Wound consult ordered: Yes      Was skin checked with incoming RN/PCT? Yes  ICU ONLY: Was HAPI Intervention list reviewed with incoming RN/PCT? Yes    Percell Boston, RN  March 04, 2021  4:46 PM    Second RN/PCT Name:        Wound 03/04/21 Arterial/Ischemic Toe (Comment  which one) Anterior;Right Discolored, black (Active)   Site Description Black;Maroon 03/04/21 1600   Dressing Other (Comment) 03/04/21 1600   Dressing Status Other (Comment) 03/04/21 1600       Wound 03/04/21 Arterial/Ischemic Toe (Comment  which one) Anterior;Left Discolored (Active)   Site Description Black;Maroon 03/04/21 1600   Peri-wound Description Maroon 03/04/21 1600   Drainage Amount None 03/04/21 1600   Treatments Cleansed;Site care 03/04/21 1200

## 2021-03-04 NOTE — Progress Notes (Signed)
Pt is contact isolation, PPE done per protocol. Pt is alert and oriented. Pt denied pain and sob.  LAVF access is optimally working. No bleeding on access site post tx.  Hemodialysis ended and pt.  able to finish 3hrs tx.  Able to removed Net Fluid of  3.1L.  Reports were given to Primary nurse.         03/04/21 0754   Treatment Summary   Time Off Machine 0730   Duration of Treatment (Hours) 3   Treatment Type 1:1   Dialyzer Clearance Moderately streaked   Fluid Volume Off (mL) 3550   Prime Volume (mL) 200   Rinseback Volume (mL) 250   Fluid Given: Normal Saline (mL) 0   Fluid Given: PRBC  0 mL   Fluid Given: Albumin (mL) 0   Fluid Given: Other (mL) 0   Total Fluid Given 450   Hemodialysis Net Fluid Removed 3100   Post Treatment Assessment   Post-Treatment Weight (Kg)   (-3.1L.)   Patient Response to Treatment tolerated   Additional Dialyzer Used 0   Graft/Fistula Left   No placement date or time found.   Access Type: Arteriovenous Fistula  Orientation: Left  Graft/Fistula Location: Upper Arm   Fistula/ Graft Assessment Abnormalities WDL   Needle Size 16 Gauge   Cannulation Sites held Arterial (min) 8   Cannulation Sites held Venous (min) 8   Hemostasis Achieved Yes   Vitals   Temp 97.5 F (36.4 C)   Heart Rate 80   Resp Rate 15   BP 176/77   SpO2 97 %   O2 Device Nasal cannula   Assessment   Mental Status Alert;Oriented   Cardiac Regularity Regular   Cardiac Rhythm Normal Sinus Rhythm   Respiratory Pattern Regular   Bilateral Breath Sounds Clear;Diminished   RLE Edema +1   LLE Edema +1   General Skin Color Appropriate for ethnicity   Skin Condition/Temp Warm;Dry   Gastrointestinal (WDL) WDL   GI Symptoms None   Mobility Bed   Pain Assessment   Charting Type Reassessment   Pain Scale Used Numeric Scale (0-10)   Numeric Pain Scale   Pain Score 0   POSS Score 1   Education   Person taught Patient   Knowledge basis Substantial   Topics taught Fluid Management;K+   Teaching Tools Explain   Reponse Verbalizes  Understanding   Bedside Nurse Communication   Name of bedside RN - post dialysis Rozanna Boer, RN.

## 2021-03-04 NOTE — UM Notes (Addendum)
Admit to Observation (Order #[id number]) on 03/04/21          PATIENT NAME: [name],[name]   DOB: [birth year]     Reason for Admission  66 y.o. female with multiple medical problem including end-stage renal disease, poorly controlled hypertension, diabetes melitis type II and history of nonadherence to medical treatment who was brought by EMS complaining of progressive dyspnea for 1 week.  She also was found to be hypertensive and hyperglycemic.Patient missed 2 hemodialysis sessions over the last week, reports nonadherence to her medication as well.  At this time no focal weakness or numbness, no chest pain, no hemoptysis no nausea or vomiting.  Patient was found to be hyperkalemic, received medical treatment in form of insulin calcium and Kayexalate.  Nephrology consulted and plan for emergent hemodialysis    Temp:  [97.5 F (36.4 C)-98.6 F (37 C)] 97.5 F (36.4 C)  Heart Rate:  [69-83] 83  Resp Rate:  [11-34] 22  BP: (134-212)/(53-146) 174/73   O2 Sat 85-100% 2-4L  Wt 111.4 kg    MEDS GIVEN:  labetalol (NORMODYNE,TRANDATE) injection 10 mg  Start: 03/04/21 0006     methylPREDNISolone sodium succinate (Solu-MEDROL) injection 125 mg  Start: 03/04/21 0006     calcium GLUConate 10 % injection 1 g  Start: 03/04/21 0107     sodium bicarbonate 8.4 % injection 50 mEq  Start: 03/04/21 0107     insulin regular (HumuLIN R) 5 Units intravenous injection  Start: 03/04/21 0107     hydrALAZINE (APRESOLINE) injection 10 mg  Start: 03/04/21 0137         Scheduled Meds:  Current Facility-Administered Medications   Medication Dose Route Frequency    amLODIPine  10 mg Oral Daily    aspirin  81 mg Oral Daily    atorvastatin  40 mg Oral Daily    calcium GLUConate  1 g Intravenous Once    enoxaparin  30 mg Subcutaneous Daily    insulin glargine  20 Units Subcutaneous Daily    insulin lispro  1-8 Units Subcutaneous Q4H SCH    insulin regular  5 Units Intravenous Once    pantoprazole  40 mg Oral QAM AC    petrolatum   Topical Q12H     senna-docusate  1 tablet Oral Q12H SCH    sodium bicarbonate  50 mEq Intravenous Once     Continuous Infusions:  PRN Meds:.acetaminophen, Nursing communication: Adult Hypoglycemia Treatment Algorithm **AND** dextrose **AND** dextrose **AND** dextrose **AND** glucagon (rDNA), hydrALAZINE, oxyCODONE     Abnormal Labs platelets 92, glucose 517, BUN 99, Creat 11.4K 6.2, anion gap 17, calcium 7.9, alk phos 203, ABG PH 7.37, pO2 93.7, HCO3 18.5, INR 1.2    Imaging   CXR:    Suspect mild recurrent pulmonary edema.  Did not appreciate hyperkalemic changes on EKG    Exam:pt is alert and oriented, Pt denied pain and sob    Plan of Care  CV:   -Poorly controlled hypertension/hypertensive urgency  Resume home antihypertensive medication, use hydralazine as needed to maintain systolic blood pressure below 180, till she is dialyzed I will hold on beta-blocker  -Elevated troponin:  No clear ischemic changes on EKG, likely related to demand supply with her hypertension and the patient with end-stage renal disease  PULM:   -Acute on chronic hypoxic respiratory failure:  Reportedly patient on 2 Young nasal cannula at home, now with increased oxygen requirement likely due to volume overload due to missing hemodialysis  Supplemental oxygen to  maintain saturation above 90%  GI:   GI PPX: Patient on pantoprazole at home [unclear indication]  Nutrition: N.p.o. for now given risk for deterioration and possibly needing intubation  RENAL:   -End-stage renal disease: Scheduled for hemodialysis Monday Wednesday Friday.  Last received hemodialysis on Friday [missed Monday and Wednesday]  -Hyperkalemia: Received medical treatment in form of insulin, calcium and Kayexalate.  Did not appreciate hyperkalemic changes on EKG  History of nonadherence with recent hospitalization for missing hemodialysis  Nephrology contacted and plan for emergent hemodialysis, cruciates are intact  ID:   Monitor off antibiotic  HEME:   SCD's  Pharmacologic ppx: Lovenox    ENDO/RHEUM:   -Poorly controlled diabetes mellitus type 2: Start patient on Lantus and insulin sliding scale  Glucose: goal 100 -180  Code Status: Full Code  Patient is currently able  to make medical decisions.      2/10: Patient seen and examined at bedside.  Patient denies any complaints.  Patient reports being noncompliant with dialysis.  Patient hemodynamics are within normal limits.  Patient currently stable to be transferred to [facility] telemetry.  Restarted patient's home dose Coreg at 12:30 0.5 twice daily which can be gradually increased to 25 twice daily.  Patient glucose is elevated, getting insulin sign scale and Lantus, patient also received steroids anticipate the sugar should get improved as the effect wears off.  Physical therapy ordered. Discussed with hospitalist.       UTILIZATION REVIEW CONTACT: Marica Otter  RN, BSN  Utilization Review   [address] [place]  [place], [place] [address]  Phone: [phone] (Voice Mail Only)  Email: [email or web address]  Tax ID:  [id number]         NOTES TO REVIEWER:    This clinical review is based on/compiled from documentation provided by the treatment team within the patient's medical record.

## 2021-03-04 NOTE — Consults (Signed)
CONSULTATION    Date Time: 03/04/21 10:48 AM  Patient Name: [name]  Requesting Physician: Corliss Parish, MD      Chief Complaint on Admission:     Admitted with progressive dyspnea.  Missed dialysis    Reason for Consultation:   I have been asked by ICU team to consult on this patient for continuation of medical care while patient is getting transition from ICU    Assessment:   Progressive dyspnea due to volume overloading missed dialysis  Hyperkalemia  End-stage renal disease on dialysis  Diabetes mellitus  Bilateral great toe skin breakdown  Hypertension  Hyperlipidemia  COPD  Obstructive sleep apnea  Noncompliance with medical treatment, follow-up  Deconditioning    Plan:     Patient was admitted to ICU had urgent dialysis hyperkalemia was treated respiratory status improved after dialysis.  Nephrology following we will continue dialysis per nephrology.  Low potassium diet.  Sliding-scale coverage , Lantus  Podiatry consultation requested.  Continue Norvasc, aspirin, statin.  Continue Coreg.  Continue other home medication.  Local toe care, podiatry consultation.  Symptomatic treatment  DVT prophylaxis  Discharge planning when stable  Labs and data reviewed.  Discussed with patient, nurse.  Discussed with Dr.Rehman     History:   [name] is a 66 y.o. female with history of end-stage renal disease who had missed dialysis has history of diabetes hypertension hyperlipidemia COPD obstructive sleep apnea deconditioning, noncompliance with medication who presented to emergency room with complaints of shortness of breath for 1 week patient had missed dialysis last dialysis was last Friday patient was found with elevated blood pressure, elevated blood sugar, hyperkalemia, was not admitted to intensive care unit had dialysis was resumed on home medication.  She is afebrile.  Denies chest pain shortness of breath improved after dialysis.  CBC shows normal white cell count H&H 11.3 and 37.3  chemistry panel was consistent with  BUN 55 creatinine 7.6, potassium was elevated ,-improved after dialysis.  Patient is getting transferred from ICU  Past Medical History:     Past Medical History:   Diagnosis Date    Chronic obstructive pulmonary disease     Diabetes mellitus     Hyperlipidemia     Hypertension     Sleep apnea 2018       Past Surgical History:     Past Surgical History:   Procedure Laterality Date    EGD, BIOPSY N/A 05/14/2019    Procedure: EGD, BIOPSY;  Surgeon: Ronie Spies, MD;  Location: [facility];  Service: Gastroenterology;  Laterality: N/A;    EXPLORATORY LAPAROTOMY N/A 05/27/2019    Procedure: EXPLORATORY LAPAROTOMY;  Surgeon: Herbert Moors., MD;  Location: [facility];  Service: General;  Laterality: N/A;    JOINT REPLACEMENT      shoulder    JOINT REPLACEMENT      knee    OVARY SURGERY Left     Removal    REMOVAL, FOREIGN BODY N/A 05/27/2019    Procedure: REMOVAL, PERITONEAL DIALYSIS CATHETER;  Surgeon: Herbert Moors., MD;  Location: [facility];  Service: General;  Laterality: N/A;    TUNNELED CATH CHECK/CHANGE (PERMCATH) N/A 07/03/2019    Procedure: TUNNELED CATH CHECK/CHANGE;  Surgeon: Maureen Ralphs, MD;  Location: [facility];  Service: Interventional Radiology;  Laterality: N/A;    TUNNELED CATH PLACEMENT (PERMCATH) Bilateral 05/23/2019    Procedure: TUNNELED CATH PLACEMENT;  Surgeon: Jacques Earthly, MD;  Location: [facility];  Service: Interventional Radiology;  Laterality: Bilateral;    TUNNELED CATH PLACEMENT (PERMCATH) Right 07/31/2019    Procedure: TUNNELED CATH PLACEMENT;  Surgeon: Jacques Earthly, MD;  Location: [facility];  Service: Interventional Radiology;  Laterality: Right;       Family History:     Family History   Problem Relation Age of Onset    Diabetes Mother     Hypertension Mother     Diabetes Father     Hypertension Father     Diabetes Sister     Hypertension Sister     Diabetes Brother     Hypertension Brother     Diabetes Paternal  Aunt     Diabetes Paternal Uncle        Social History:     Social History     Socioeconomic History    Marital status: Legally Separated     Spouse name: Not on file    Number of children: Not on file    Years of education: Not on file    Highest education level: Not on file   Occupational History    Not on file   Tobacco Use    Smoking status: Never    Smokeless tobacco: Never   Vaping Use    Vaping Use: Never used   Substance and Sexual Activity    Alcohol use: Never    Drug use: Never    Sexual activity: Not Currently   Other Topics Concern    Not on file   Social History Narrative    Not on file     Social Determinants of Health     Financial Resource Strain: Not on file   Food Insecurity: Not on file   Transportation Needs: Not on file   Physical Activity: Not on file   Stress: Not on file   Social Connections: Not on file   Intimate Partner Violence: Not on file   Housing Stability: Not on file       Allergies:     Allergies   Allergen Reactions    Lisinopril Hives    Metformin Hives       Medications:     Current Facility-Administered Medications:     acetaminophen (TYLENOL) tablet 650 mg, 650 mg, Oral, Q6H PRN, [place], Husam T, MD    amLODIPine (NORVASC) tablet 10 mg, 10 mg, Oral, Daily, Al Samarah, Husam T, MD, 10 mg at 03/04/21 0900    aspirin chewable tablet 81 mg, 81 mg, Oral, Daily, Al Samarah, Husam T, MD, 81 mg at 03/04/21 0900    atorvastatin (LIPITOR) tablet 40 mg, 40 mg, Oral, Daily, Al Samarah, Husam T, MD, 40 mg at 03/04/21 0900    calcium gluconate 1 g in 50 mL premix, 1 g, Intravenous, Once, Ranelle Oyster B, PA    carvedilol (COREG) tablet 12.5 mg, 12.5 mg, Oral, Q12H [place], Modesto Charon, MD, 12.5 mg at 03/04/21 1044    Nursing communication: Adult Hypoglycemia Treatment Algorithm, , , Until Discontinued **AND** dextrose (GLUCOSE) 40 % oral gel 15 g of glucose, 15 g of glucose, Oral, PRN **AND** dextrose (D10W) 10% bolus 125 mL, 12.5 g, Intravenous, PRN **AND** dextrose 50 % bolus 12.5 g,  12.5 g, Intravenous, PRN **AND** glucagon (rDNA) (GLUCAGEN) injection 1 mg, 1 mg, Intramuscular, PRN, Modesto Charon, MD    heparin (porcine) 5000 UNIT/ML injection 5,000 Units, 5,000 Units, Subcutaneous, Q8H [place], Modesto Charon, MD    hydrALAZINE (APRESOLINE) injection 10 mg, 10 mg, Intravenous, Q6H PRN,  [place], Husam T, MD    insulin glargine (LANTUS) injection 20 Units, 20 Units, Subcutaneous, Daily, Al Samarah, Husam T, MD, 20 Units at 03/04/21 6144    insulin lispro injection 1-6 Units, 1-6 Units, Subcutaneous, QHS **AND** NSG Communication: Glucose POCT order, , , At bedtime, Modesto Charon, MD    insulin lispro injection 2-10 Units, 2-10 Units, Subcutaneous, TID AC **AND** NSG Communication: Glucose POCT order, , , AC, Modesto Charon, MD    [EXPIRED] NSG Communication: Glucose POCT order, , , Q1H **AND** [DISCONTINUED] dextrose (D10W) 10% bolus 500 mL, 50 g, Intravenous, Once **AND** insulin regular (HumuLIN R) 5 Units intravenous injection, 5 Units, Intravenous, Once, Ranelle Oyster B, PA    oxyCODONE (ROXICODONE) immediate release tablet 5 mg, 5 mg, Oral, Q4H PRN, [place], Husam T, MD    pantoprazole (PROTONIX) EC tablet 40 mg, 40 mg, Oral, QAM AC, [place]ar[place], Husam T, MD, 40 mg at 03/04/21 0900    petrolatum (AQUAPHOR) ointment, , Topical, Q12H, [place], Husam T, MD    senna-docusate (PERICOLACE) 8.6-50 MG per tablet 1 tablet, 1 tablet, Oral, Q12H [place], Al Samarah, Husam T, MD    sodium bicarbonate 8.4 % injection 50 mEq, 50 mEq, Intravenous, Once, Ranelle Oyster B, PA    Review of Systems:   A comprehensive review of systems was: History obtained from the patient  General ROS: negative for - chills, fever or weight gain  Psychological ROS: negative for - anxiety, disorientation, hallucinations or mood swings  ENT ROS: negative for - headaches, nasal discharge or tinnitus  Respiratory: Less shortness of breath after dialysis intermittent cough  Cardiovascular ROS: negative for - chest pain, edema,  irregular heartbeat or murmur  Gastrointestinal ROS: no abdominal pain, change in bowel habits, or black or bloody stools  Musculoskeletal ROS: negative for - joint pain, muscle pain or muscular weakness  Neurological ROS: negative for - confusion, seizures or tremors      Physical Exam:     Vitals:    03/04/21 1044   BP: 188/83   Pulse: 96   Resp:    Temp:    SpO2:        Intake and Output Summary (Last 24 hours) at Date Time    Intake/Output Summary (Last 24 hours) at 03/04/2021 1048  Last data filed at 03/04/2021 0754  Gross per 24 hour   Intake --   Output 3100 ml   Net -3100 ml       General appearance - awake alert has frequent facial movements (possible tardive dyskinesia).  Neck: Supple no JVD  ENT: PERRLA, EOMI  Lungs: Clear to auscultation no crackles no wheezing  Heart: Regular rate and rhythm no gallop  Abdomen: Soft nontender bowel sounds positive  Extremities: Warm moist  Bilateral great toes poor hygiene, eschars  Neuro: Grossly intact  No facial asymmetry, moves upper and lower extremities  Labs Reviewed:     Results       Procedure Component Value Units Date/Time    Glucose Whole Blood - POCT [[id number]]  (Abnormal) Collected: 03/04/21 0749     Updated: 03/04/21 0804     Whole Blood Glucose POCT 263 mg/dL     Hepatitis B (HBV) Surface Antigen w/ Reflex to Confirmation [[id number]] Collected: 03/04/21 0007    Specimen: Blood Updated: 03/04/21 0729     Hepatitis B Surface Antigen Non-Reactive    Culture Blood Aerobic and Anaerobic [[id number]] Collected: 03/04/21 0123    Specimen: Arm from Blood, Venipuncture  Updated: 03/04/21 0452    Narrative:      1 BLUE+1 PURPLE    Culture Blood Aerobic and Anaerobic [[id number]] Collected: 03/04/21 0123    Specimen: Arm from Blood, Venipuncture Updated: 03/04/21 0452    Narrative:      1 BLUE+1 PURPLE    Glucose Whole Blood - POCT [[id number]]  (Abnormal) Collected: 03/04/21 0430     Updated: 03/04/21 0433     Whole Blood Glucose POCT 371 mg/dL     Basic Metabolic  Panel [[id number]]  (Abnormal) Collected: 03/04/21 0300    Specimen: Blood Updated: 03/04/21 0333     Glucose 517 mg/dL      BUN 99.0 mg/dL      Creatinine 11.4 mg/dL      Calcium 7.9 mg/dL      Sodium 136 mEq/L      Potassium 6.2 mEq/L      Chloride 100 mEq/L      CO2 19 mEq/L      Anion Gap 17.0    GFR [[id number]] Collected: 03/04/21 0300     Updated: 03/04/21 0333     EGFR 4.0    CBC and differential [[id number]]  (Abnormal) Collected: 03/04/21 0300    Specimen: Blood Updated: 03/04/21 0328     WBC 5.43 x10 3/uL      Hgb 11.3 g/dL      Hematocrit 37.3 %      Platelets 92 x10 3/uL      RBC 3.95 x10 6/uL      MCV 94.4 fL      MCH 28.6 pg      MCHC 30.3 g/dL      RDW 14 %      MPV 12.5 fL      Instrument Absolute Neutrophil Count 3.65 x10 3/uL      Neutrophils 67.1 %      Lymphocytes Automated 19.2 %      Monocytes 11.6 %      Eosinophils Automated 1.3 %      Basophils Automated 0.6 %      Immature Granulocytes 0.2 %      Nucleated RBC 0.0 /100 WBC      Neutrophils Absolute 3.65 x10 3/uL      Lymphocytes Absolute Automated 1.04 x10 3/uL      Monocytes Absolute Automated 0.63 x10 3/uL      Eosinophils Absolute Automated 0.07 x10 3/uL      Basophils Absolute Automated 0.03 x10 3/uL      Immature Granulocytes Absolute 0.01 x10 3/uL      Absolute NRBC 0.00 x10 3/uL     Glucose Whole Blood - POCT [[id number]]  (Abnormal) Collected: 03/04/21 0255     Updated: 03/04/21 0257     Whole Blood Glucose POCT 358 mg/dL     Cell MorpHology [[id number]]  (Abnormal) Collected: 03/04/21 0123     Updated: 03/04/21 0203     Cell Morphology Normal     Platelet Estimate Decreased    CBC and differential [[id number]]  (Abnormal) Collected: 03/04/21 0123    Specimen: Blood Updated: 03/04/21 0202     WBC 5.36 x10 3/uL      Hgb 11.6 g/dL      Hematocrit 38.4 %      Platelets 90 x10 3/uL      RBC 4.08 x10 6/uL      MCV 94.1 fL      MCH 28.4 pg      MCHC 30.2 g/dL  RDW 14 %      MPV 13.2 fL      Instrument Absolute Neutrophil Count 3.66 x10  3/uL      Neutrophils 68.2 %      Lymphocytes Automated 18.1 %      Monocytes 11.2 %      Eosinophils Automated 1.5 %      Basophils Automated 0.6 %      Immature Granulocytes 0.4 %      Nucleated RBC 0.0 /100 WBC      Neutrophils Absolute 3.66 x10 3/uL      Lymphocytes Absolute Automated 0.97 x10 3/uL      Monocytes Absolute Automated 0.60 x10 3/uL      Eosinophils Absolute Automated 0.08 x10 3/uL      Basophils Absolute Automated 0.03 x10 3/uL      Immature Granulocytes Absolute 0.02 x10 3/uL      Absolute NRBC 0.00 x10 3/uL     PT/APTT [[id number]]  (Abnormal) Collected: 03/04/21 0123     Updated: 03/04/21 0150     PT 13.6 sec      PT INR 1.2     PTT 30 sec     COVID-19 (SARS-CoV-2) and Influenza A/B, NAA ([facility])- Admission [[id number]] Collected: 03/04/21 0056    Specimen: Culturette from Nasopharyngeal Updated: 03/04/21 0141     Purpose of COVID testing Diagnostic -[facility]I     SARS-CoV-2 Specimen Source Nasal Swab     SARS CoV 2 Overall Result Not Detected     Influenza A Not Detected     Influenza B Not Detected    Narrative:      o Collect and clearly label specimen type:  o PREFERRED-Upper respiratory specimen: One Nasal Swab in  Transport Media.  o Hand deliver to laboratory ASAP  Diagnostic -[facility]I    Lactic Acid [[id number]] Collected: 03/04/21 0123    Specimen: Blood Updated: 03/04/21 0135     Lactic Acid 1.4 mmol/L     Narrative:      Cancel second order for specimen if the initial level is less  than 2.35mEq/L    High Sensitivity Troponin-I at 2 hrs with calculated Delta [[id number]] Collected: 03/04/21 0123    Specimen: Blood Updated: 03/04/21 0123    High Sensitivity Troponin-I at 0 hrs [[id number]]  (Abnormal) Collected: 03/04/21 0007    Specimen: Blood Updated: 03/04/21 0050     hs Troponin-I 102.0 ng/L     Arterial Blood Gas (ABG) [[id number]]  (Abnormal) Collected: 03/04/21 0030    Specimen: Blood, Arterial Updated: 03/04/21 0045     pH, Arterial 7.307     pCO2, Arterial 38.1 mmhg      pO2, Arterial 93.7  mmhg      HCO3, Arterial 18.5 mEq/L      Arterial Total CO2 38.6 mEq/L      Base Excess, Arterial -6.8 mEq/L      O2 Sat, Arterial 96.2 %      ABG CollectionSite Right Radl     Allen's Test Yes     Temperature 37.0     FIO2 28 %      Status Oxygen     O2 Delivery Nasal Cannula     O2 Flow 2.0 L/min     Lactic Acid [[id number]] Collected: 03/04/21 0007    Specimen: Blood Updated: 03/04/21 0042     Lactic Acid 1.3 mmol/L     Comprehensive metabolic panel [[id number]]  (Abnormal) Collected: 03/04/21 0007    Specimen: Blood Updated:  03/04/21 0040     Glucose 551 mg/dL      BUN 99.0 mg/dL      Creatinine 11.4 mg/dL      Sodium 135 mEq/L      Potassium 6.9 mEq/L      Chloride 102 mEq/L      CO2 18 mEq/L      Calcium 7.7 mg/dL      Protein, Total 6.1 g/dL      Albumin 2.8 g/dL      AST (SGOT) 14 U/L      ALT 20 U/L      Alkaline Phosphatase 203 U/L      Bilirubin, Total 0.4 mg/dL      Globulin 3.3 g/dL      Albumin/Globulin Ratio 0.8     Anion Gap 15.0    GFR [[id number]] Collected: 03/04/21 0007     Updated: 03/04/21 0040     EGFR 4.0    Glucose Whole Blood - POCT [[id number]]  (Abnormal) Collected: 03/03/21 2343     Updated: 03/03/21 2346     Whole Blood Glucose POCT 434 mg/dL                 Rads:   Radiological Procedure reviewed.   Radiology Results (24 Hour)       Procedure Component Value Units Date/Time    Chest AP Portable [[id number]] Collected: 03/04/21 0134    Order Status: Completed Updated: 03/04/21 0138    Narrative:      CLINICAL HISTORY:  Chest Pain    EXAMINATION:  XR CHEST AP PORTABLE    COMPARISON:  02/04/2021     FINDINGS:     Lungs: mild diffuse interstitial opacities in both lungs       Pleural space: Within normal limits.    Mediastinum: Within normal limits.    Bones: No acute abnormality. Left humeral prosthesis is again noted    Soft tissues: Within normal limits.        Impression:        Suspect mild recurrent pulmonary edema.    Keith Rake, MD   03/04/2021 1:36 AM              Signed by: Barbaraann Faster, MD, MD    *This note was generated by the [employer] EMR system/ Dragon speech recognition and may contain inherent errors or omissions not intended by the user. Grammatical errors, random word insertions, deletions, pronoun errors and incomplete sentences are occasional consequences of this technology due to software limitations. Not all errors are caught or corrected. If there are questions or concerns about the content of this note or information contained within the body of this dictation they should be addressed directly with the author for clarification

## 2021-03-04 NOTE — Plan of Care (Addendum)
Patient seen and examined at bedside.  Patient denies any complaints.  Patient reports being noncompliant with dialysis.  Patient hemodynamics are within normal limits.  Patient currently stable to be transferred to [facility] telemetry.  Restarted patient's home dose Coreg at 12:30 0.5 twice daily which can be gradually increased to 25 twice daily.  Patient glucose is elevated, getting insulin sign scale and Lantus, patient also received steroids anticipate the sugar should get improved as the effect wears off.  Physical therapy ordered. Discussed with hospitalist.

## 2021-03-04 NOTE — Consults (Signed)
Wound Ostomy Continence Consultation    Date Time: 03/04/21 8:58 AM  Patient Name: [name], [name]  Consulting Service: [facility] Day: 2     Reason for Consult   Foot injuries     Assessment & Plan   Assessment:   Patient assessed in her room resting in bed. She is awake and alert, able to follow commands and assist with repositioning.    She has bruising on her bilateral great toes with dark purple discoloration below the bilateral nail beds. A little bit of peeling skin. Her legs and feet have dry flaky skin.  No open or draining wounds noted.    Lab Results   Component Value Date    HGBA1C 11.7 (H) 01/07/2021    HGBA1C 11.8 (H) 10/21/2020    HGBA1C 10.2 (H) 01/01/2020    GLU 517 (HH) 03/04/2021    LDL 83 10/21/2020    CREAT 11.4 (H) 03/04/2021     Recent Labs   Lab 03/04/21  0749 03/04/21  0430 03/04/21  0255 03/03/21  2343   Whole Blood Glucose POCT 263* 371* 358* 434*       Wound Photography:                 Plan/Follow-Up:   Skin Care to arms, legs and feet:  Apply Aquaphor after AM/PM care. DO NOT apply between toes.    Aquaphor Healing Ointment is uniquely formulated to restore smooth, healthy skin. This multi-purpose ointment protects and soothes extremely dry skin, chapped lips, cracked hands and feet, minor cuts and burns, and many other skin irritations.    Initiate/ continue pressure prevention bundle:  Head of the bed 30 degrees or less  Positioning device to the bedside  Eliminate/minimize pressure from the area  Float heels with boots or pillows  Turn patient  Pressure redistribution cushion to the chair  Use lift sheets/low friction surface sheets for positioning  Pad bony prominences  Nutrition consult/Optimize nutrition  Initiate bed algorithm/Specialty bed  Moisture/Incontinence management - Cleanse with incontinence cleansing wipe/water to manage incontinence and to protect skin from exposure to urine and stool. Apply skin barrier protection cream. Apply [place]/external or female  external urine management system to prevent urinary contamination of a wound. Apply rectal pouch/fecal management system per unit policy, to prevent fecal contamination of a wound or to contain diarrhea.      Objective Findings   Braden: Braden Scale Score: 18 (03/04/21 0400)  Braden Subscales:  Sensory Perceptions: No impairment (03/04/21 0400)  Moisture: Occasionally moist (03/04/21 0400)  Activity: Walks occasionally (03/04/21 0400)  Mobility: Slightly limited (03/04/21 0400)  Nutrition: Probably inadequate (03/04/21 0400)  Friction and Shear: No apparent problem (03/04/21 0400)    Ht Readings from Last 1 Encounters:   03/04/21 1.549 m (5\' 1" )     Wt Readings from Last 3 Encounters:   03/04/21 111.4 kg (245 lb 9.5 oz)   02/08/21 98.6 kg (217 lb 4.8 oz)   01/12/21 93.8 kg (206 lb 12.7 oz)     Body mass index is 46.4 kg/m.    Current Diet:   Diet NPO effective now  Supervise For Meals Frequency: All meals     Mobility: bedfast    Continence: external catheter    Current Interventions: pillows for repositioning, sacral and heel foam dressings, tap system    Mattress: ICU Sport Mattress    Patient Education: Discussed with the patient and all questioned fully answered.     History of  Present Illness   This is a 66 y.o. female  has a past medical history of Chronic obstructive pulmonary disease, Diabetes mellitus, Hyperlipidemia, Hypertension, and Sleep apnea (2018)..  Admitted with Hyperkalemia.       From H&P by Dr. Annitta Needs:  [name] is a 66 y.o. female with multiple medical problem including end-stage renal disease, poorly controlled hypertension, diabetes melitis type II and history of nonadherence to medical treatment who was brought by EMS complaining of progressive dyspnea for 1 week.  She also was found to be hypertensive and hyperglycemic.  Patient missed 2 hemodialysis sessions over the last week, reports nonadherence to her medication as well.  At this time no focal weakness or  numbness, no chest pain, no hemoptysis no nausea or vomiting.  Patient was found to be hyperkalemic, received medical treatment in form of insulin calcium and Kayexalate.  Nephrology consulted and plan for emergent hemodialysis.     Lab   Significant Lab Values:    Recent Labs     03/04/21  0300 03/04/21  0123 03/04/21  0007   WBC 5.43   < >  --    RBC 3.95   < >  --    Hgb 11.3*   < >  --    Hematocrit 37.3   < >  --    Sodium 136  --  135   Potassium 6.2*  --  6.9*   Chloride 100  --  102   CO2 19  --  18   BUN 99.0*  --  99.0*   Creatinine 11.4*  --  11.4*   Calcium 7.9*  --  7.7*   Albumin  --   --  2.8*   EGFR 4.0  --  4.0   Glucose 517*  --  551*    < > = values in this interval not displayed.       Hanley Hays, BSN, RN, [employer], [place]  Inpatient Wound, Ostomy, and Continence Nurse Coordinator  [facility]  [phone]

## 2021-03-04 NOTE — OT Eval Note (Cosign Needed Addendum)
Occupational Therapy Eval and Treatment [name]        Post Acute Care Therapy Recommendations   Discharge Recommendations:  Home with home health OT, Home with supervision, Home with home health PT (SPV c OOB activity)    If recommended discharge disposition is not available, patient will require the following assistance: SNF    DME needs IF patient is discharging home: Patient already has needed equipment    Therapy discharge recommendations may change with patient status.  Please refer to most recent note for up-to-date recommendations.    Is PT evaluation indicated at this time? This patient is already on PT caseload.    Unit: MED SURG ICU 1  Bed: A2920/A2920-01      ___________________________________________________    Time of Evaluation and Treatment:  Time Calculation  OT Received On: 03/04/21  Start Time: 1616  Stop Time: 1640  Time Calculation (min): 24 min    Chart Review and Collaboration with Care Team: 5 minutes, not included in above time.    Evaluation: 10 minutes  Treatment:  14 minutes    OT Visit Number: 1    Consult received for [name] for OT Evaluation and Treatment.  Patient's medical condition is appropriate for Occupational therapy intervention at this time.    Activity Orders:  OT eval and treat    Precautions and Contraindications:  Precautions  Weight Bearing Status: no restrictions  Fall Risks: High, History of fall(s), Impaired balance/gait, Impaired mobility, Muscle weakness  Skin breakdown present : Yes (toes L foot, brusing, pt statated "I fell")  Other Precautions: Falls, Contact    Personal Protective Equipment (PPE)  gloves, procedure gown, and procedure mask    Medical Diagnosis:  Hyperkalemia [E87.5]  SOB (shortness of breath) [R06.02]  Elevated troponin [R77.8]  Non compliance with medical treatment [Z91.199]  ESRD (end stage renal disease) on dialysis [N18.6, Z99.2]  Hypertensive urgency [I16.0]    History of Present Illness:  [name] is a 66  y.o. female admitted on 03/03/2021 with multiple medical problem including end-stage renal disease, poorly controlled hypertension, diabetes melitis type II and history of nonadherence to medical treatment who was brought by EMS complaining of progressive dyspnea for 1 week.  She also was found to be hypertensive and hyperglycemic.  Patient missed 2 hemodialysis sessions over the last week, reports nonadherence to her medication as well.  At this time no focal weakness or numbness, no chest pain, no hemoptysis no nausea or vomiting.  In the ED patient was found to be hyperkalemic, received medical treatment in form of insulin calcium and Kayexalate. Nephrology consulted and plan for emergent hemodialysis.    Patient Active Problem List   Diagnosis    Iron deficiency anemia secondary to inadequate dietary iron intake    Sideropenic dysphagia    Peritoneal dialysis catheter dysfunction, initial encounter    Peritonitis    Upper GI bleed    ESRD (end stage renal disease) on dialysis    Generalized abdominal pain    Thrombocytopenia    h/o orthostatic hypotension    Type 2 diabetes mellitus, with long-term current use of insulin    GERD (gastroesophageal reflux disease)    Bowel incontinence    Right sided numbness    Uncontrolled type 2 diabetes mellitus with hyperglycemia    Hypertension    Hyperlipidemia    History of COPD    Gastritis with hemorrhage    History of anemia due to chronic kidney disease  Open wound, abdominal wall, lateral, subsequent encounter    Gout    Dizziness    Fall    History of CVA (cerebrovascular accident)    Pneumonia due to COVID-19 virus    Diarrhea    Hypertensive urgency    Shortness of breath    ESRD (end stage renal disease)    Hyperkalemia    Pulmonary edema    Hypervolemia    Elevated troponin    Chest pain    Nausea vomiting and diarrhea    Breast pain, left    Syncope    Hypertensive emergency        Past Medical/Surgical History:  Past Medical History:   Diagnosis Date    Chronic  obstructive pulmonary disease     Diabetes mellitus     Hyperlipidemia     Hypertension     Sleep apnea 2018     Past Surgical History:   Procedure Laterality Date    EGD, BIOPSY N/A 05/14/2019    Procedure: EGD, BIOPSY;  Surgeon: Ronie Spies, MD;  Location: [facility];  Service: Gastroenterology;  Laterality: N/A;    EXPLORATORY LAPAROTOMY N/A 05/27/2019    Procedure: EXPLORATORY LAPAROTOMY;  Surgeon: Herbert Moors., MD;  Location: [facility];  Service: General;  Laterality: N/A;    JOINT REPLACEMENT      shoulder    JOINT REPLACEMENT      knee    OVARY SURGERY Left     Removal    REMOVAL, FOREIGN BODY N/A 05/27/2019    Procedure: REMOVAL, PERITONEAL DIALYSIS CATHETER;  Surgeon: Herbert Moors., MD;  Location: [facility];  Service: General;  Laterality: N/A;    TUNNELED CATH CHECK/CHANGE (PERMCATH) N/A 07/03/2019    Procedure: TUNNELED CATH CHECK/CHANGE;  Surgeon: Maureen Ralphs, MD;  Location: [facility];  Service: Interventional Radiology;  Laterality: N/A;    TUNNELED CATH PLACEMENT (PERMCATH) Bilateral 05/23/2019    Procedure: TUNNELED CATH PLACEMENT;  Surgeon: Jacques Earthly, MD;  Location: [facility];  Service: Interventional Radiology;  Laterality: Bilateral;    TUNNELED CATH PLACEMENT (PERMCATH) Right 07/31/2019    Procedure: TUNNELED CATH PLACEMENT;  Surgeon: Jacques Earthly, MD;  Location: [facility];  Service: Interventional Radiology;  Laterality: Right;        X-Rays/Tests/Labs  Lab Results   Component Value Date/Time    HGB 11.3 (L) 03/04/2021 03:00 AM    HCT 37.3 03/04/2021 03:00 AM    K 4.0 03/04/2021 02:13 PM    NA 138 03/04/2021 02:13 PM    INR 1.2 (H) 03/04/2021 01:23 AM    TROPI 102.0 (AA) 03/04/2021 12:07 AM    TROPI 109.2 (AA) 02/04/2021 06:45 AM    TROPI 11.0 02/04/2021 06:45 AM    TROPI 98.2 (AA) 02/04/2021 04:55 AM    TROPI 5.9 02/04/2021 04:55 AM    TROPI 92.3 (AA) 02/04/2021 02:27 AM    TROPI 0.01 03/18/2006 06:10 PM       All imaging reviewed. 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 Nursing clears patient for therapy.             Objective:   Inspection/Posture  Inspection/Posture: morbid ovesity, jerky movements while supine in a bed, ataxia, lethrgic, easily to arouse  Observation of Patient/Vital Signs: 142/66 mmHg; 95 bpm      Cognitive Status and Neuro Exam:  Cognition/Neuro Status  Arousal/Alertness: Delayed responses to stimuli  Attention Span: Attends to task with redirection  Orientation Level: Oriented X4    Musculoskeletal Examination  Gross ROM  Right Upper Extremity ROM: within functional limits  Left Upper Extremity ROM: within functional limits  Gross Strength  Right Upper Extremity Strength: 3+/5  Left Upper Extremity Strength: 3+/5       Sensory/Oculomotor Examination  Sensory  Auditory: hearing aid left  Tactile - Light Touch: intact         Activities of Daily Living  Self-care and Home Management  LB Dressing: Dependent, Don/doff R sock, Don/doff L sock  Toileting: Dependent (external catheter)  Functional Transfers: Maximal Assist, supervision/safety, verbal prompting, increased time to complete (lifting A)    Functional Mobility:  Mobility and Transfers  Scooting to HOB: Stand by Assist  Scooting to EOB: Contact Guard  Assist  Supine to Sit: Minimal Assist (bringing BLE over EOB)  Sit to Supine: Minimal Assist (bringing BLE over EOB)  Sit to Stand: Maximal Assist, Increased Time, Increased Effort, with instruction for hand placement to increase safety, bed elevated (lifting A; pt c INC drowsiness)     Balance  Balance  Static Sitting Balance: Supervision  Dynamic Standing Balance: Maximal Assist    Participation and Activity Tolerance  Participation and Endurance  Participation Effort: good  Endurance: Tolerates < 10 min exercise, no significant change in vital signs    Educated the Patient to role of occupational therapy, plan of care, goals of therapy and safety with mobility and ADLs with verbalized understanding .    Patient left in bed with alarm and all other medical equipment in place and call bell and all personal items/needs within reach.  RN notified of session outcome.        Assessment:  [name] is a 66 y.o. female admitted 03/03/2021. OT Assessment: decreased strength;balance deficits;decreased independence with IADLs;decreased attention;decreased safety awareness;decreased endurance/activity tolerance      Treatment:  Pt transitioned supine <> EOB c Min A for bringing BLE over EOB. DEP to donn socks on BLE EOB. Pt sit to stand c Max A for lifting A, VC for hand placement. Pt c INC drowsiness and lethargy during session c difficulty keeping eyes open. Pt transitioned back to bed and bale to scoot up to [facility] c bed in trendelenburg c Min A.    Rehabilitation Potential: Prognosis: Good    Plan:  OT Frequency Recommended: 1-2x/wk   Treatment Interventions: ADL retraining, Functional transfer training, UE strengthening/ROM, Endurance training, Compensatory technique education     PMP Activity: Step 5 - Chair  Distance Walked (ft) (Step 6,7): 0 Feet    Risks/benefits/POC discussed    Goals:  Time For Goal Achievement: by time of discharge  ADL Goals  Patient will toilet: Moderate Assist  Mobility and Transfer  Goals  Pt will transfer bed to [facility]: Moderate Assist, with rolling walker                           Megan Mans, OTR/L [id number]  03/04/2021 4:49 PM    [facility]  Patient: [name] MRN#: [id number]   Unit: MED SURG ICU 1 Bed: A2920/A2920-01

## 2021-03-04 NOTE — Progress Notes (Signed)
Chart reviewed by this coordinator - pt familiar to me from frequent hosp admissions.  Established HD patient at [facility] spoke with Abran Richard, [place] @ [facility] who confirmed pt is MWF 10:45am.  Abran Richard indicates that pt is non-compliant and frequently misses OP HD.  Abran Richard reports that her records show pt last treated at chronic clinic on 1/27, no record of pt having HD at clinic in Feb.  Pt told Abran Richard, that she missed HD last week due to death in family. Writer to follow and coordinate ROC when pt is ready for d/c.     Freida Busman, HD Care Coordinator  [phone]

## 2021-03-04 NOTE — Treatment Plan (Cosign Needed)
FOUR EYES SKIN ASSESSMENT NOTE    CANDISS GALEANA  1955-08-09  [id number]    Braden Scale Score: 27    POC Initiated for Risk for Altered Skin Yes    Patient Assessed for Correct Mattress Surface Yes  *At risk patients with Braden Score less than 12 must be considered for specialty bed    Mepilex or Adhesive Foam Dressing applied to sacrum/heel if any PI risk factors present: Yes      If Wound/Pressure Injury present:  Wound/PI assessment documented in LDA (will be shown below): Yes  Admitting physician notified: Yes  Wound consult ordered: Yes      Was skin checked with incoming RN/PCT? Yes    Venia Minks, RN  March 04, 2021  5:37 AM    Second RN/PCT Name:        Wound 03/04/21 Arterial/Ischemic Toe (Comment  which one) Anterior;Right Discolored, black (Active)   Site Description Black;Maroon 03/04/21 0400       Wound 03/04/21 Arterial/Ischemic Toe (Comment  which one) Anterior;Left Discolored (Active)   Site Description Black;Maroon 03/04/21 0400

## 2021-03-04 NOTE — ED Notes (Signed)
[name] is a 66 y.o. female brought by EMS with c/o SOB for 1 week pt is on oxygen via nasal cannula  at 2LPM at home. Patient is on dialysis 3x a week, last dialysis was done on Friday. EMS blood sugar was recorded HIGH and BP 200/90.

## 2021-03-04 NOTE — Plan of Care (Signed)
NEURO: A/o x 4. Drowsy, falls asleep while talking. Able to MAE, but decrease mvt to left foot, pt voiced baseline. C/o pain to left foot, says not new. Mild edema to BLE. Uses walker at home per pt.  RESP: On 2L NC, uses same at home. Lungs sounds diminished. No cough effort noted.   CV: NSR, HR in 70s. SBP 160-190s. K+ elevated, Not replaced d/t pt receiving HD.   GI: NPO for now. Having smear loose stools from kayexalate. Incontinent x 2. On q4h BS check. Insulin given per sliding scale.   GU: External cath placed for urine. No output yet. HD pt, receiving one now.   SKIN: Has diabetic foot toe, discolored. Wound consult placed. Has scattered bruises. Able to turned and repositioned self.      Problem: Renal Instability  Goal: Fluid and electrolyte balance are achieved/maintained  Outcome: Progressing  Flowsheets (Taken 03/04/2021 0517)  Fluid and electrolyte balance are achieved/maintained:   Monitor intake and output every shift   Monitor/assess lab values and report abnormal values   Provide adequate hydration   Monitor daily weight   Assess for confusion/personality changes   Assess and reassess fluid and electrolyte status   Observe for seizure activity and initiate seizure precautions if indicated   Observe for cardiac arrhythmias   Monitor for muscle weakness   Follow fluid restrictions/IV/PO parameters     Problem: Diabetes: Glucose Imbalance  Goal: Blood glucose stable at established goal  Outcome: Progressing  Flowsheets (Taken 03/04/2021 0517)  Blood glucose stable at established goal:   Monitor lab values   Monitor intake and output.  Notify LIP if urine output is < 30 mL/hour.   Assess for hypoglycemia /hyperglycemia   Monitor/assess vital signs   Ensure appropriate diet and assess tolerance   Ensure appropriate consults are obtained (Nutrition, Diabetes Education, and Case Management)     Problem: Pain interferes with ability to perform ADL  Goal: Pain at adequate level as identified by  patient  Outcome: Progressing  Flowsheets (Taken 03/04/2021 0517)  Pain at adequate level as identified by patient:   Identify patient comfort function goal   Assess for risk of opioid induced respiratory depression, including snoring/sleep apnea. Alert healthcare team of risk factors identified.   Assess pain on admission, during daily assessment and/or before any "as needed" intervention(s)   Reassess pain within 30-60 minutes of any procedure/intervention, per Pain Assessment, Intervention, Reassessment (AIR) Cycle   Evaluate if patient comfort function goal is met   Evaluate patient's satisfaction with pain management progress

## 2021-03-04 NOTE — PT Eval Note (Signed)
Physical Therapy Eval and Treatment  [name]      Post Acute Care Therapy Recommendations   Discharge Recommendations:  Home with supervision, Home with home health PT, Home with home health OT, Other(Comment) (care with mobility and ADL's)    If recommended discharge disposition is not available, patient will require the following assistance: SNF    DME needs IF patient is discharging home: [place], [place] discharge recommendations may change with patient status.  Please refer to most recent note for up-to-date recommendations.    Is an Occupational Therapy Evaluation Indicated at this time? Yes, an acute care OT evaluation is indicated at this time and should be performed prior to hospital discharge to assist with ADL DME recommendations and/or D/C placement recommendations.    Unit: MED SURG ICU 1  Bed: A2920/A2920-01    ___________________________________________________    Time of Evaluation and Treatment:  Time Calculation  PT Received On: 03/04/21  Start Time: 1110  Stop Time: 1138  Time Calculation (min): 28 min    Evaluation Time: 18 minutes  Treatment Time: 10 minutes    Chart Review and Collaboration with Care Team: 10 minutes, not included in above time    PT Visit Number: 1    Consult received for [name] for PT Evaluation and Treatment.  Patient's medical condition is appropriate for Physical therapy intervention at this time.    Activity Orders:  PT eval and treat    Precautions and Contraindications:  Precautions  Weight Bearing Status: no restrictions  Fall Risks: High, History of fall(s), Impaired balance/gait, Impaired mobility, Muscle weakness  Skin breakdown present : Yes (toes L foot, brusing, pt statated "I fell")  Other Precautions: Contact    Personal Protective Equipment (PPE)  gloves, procedure gown, and procedure mask    Medical Diagnosis:  Hyperkalemia [E87.5]  SOB (shortness of breath) [R06.02]  Elevated troponin [R77.8]  Non compliance with  medical treatment [Z91.199]  ESRD (end stage renal disease) on dialysis [N18.6, Z99.2]  Hypertensive urgency [I16.0]    History of Present Illness:  [name] is a 66 y.o. female admitted on 03/03/2021 with multiple medical problem including end-stage renal disease, poorly controlled hypertension, diabetes melitis type II and history of nonadherence to medical treatment who was brought by EMS complaining of progressive dyspnea for 1 week.  She also was found to be hypertensive and hyperglycemic.  Patient missed 2 hemodialysis sessions over the last week, reports nonadherence to her medication as well.  At this time no focal weakness or numbness, no chest pain, no hemoptysis no nausea or vomiting.  In the ED patient was found to be hyperkalemic, received medical treatment in form of insulin calcium and Kayexalate. Nephrology consulted and plan for emergent hemodialysis.    Patient Active Problem List   Diagnosis    Iron deficiency anemia secondary to inadequate dietary iron intake    Sideropenic dysphagia    Peritoneal dialysis catheter dysfunction, initial encounter    Peritonitis    Upper GI bleed    ESRD (end stage renal disease) on dialysis    Generalized abdominal pain    Thrombocytopenia    h/o orthostatic hypotension    Type 2 diabetes mellitus, with long-term current use of insulin    GERD (gastroesophageal reflux disease)    Bowel incontinence    Right sided numbness    Uncontrolled type 2 diabetes mellitus with hyperglycemia    Hypertension    Hyperlipidemia  History of COPD    Gastritis with hemorrhage    History of anemia due to chronic kidney disease    Open wound, abdominal wall, lateral, subsequent encounter    Gout    Dizziness    Fall    History of CVA (cerebrovascular accident)    Pneumonia due to COVID-19 virus    Diarrhea    Hypertensive urgency    Shortness of breath    ESRD (end stage renal disease)    Hyperkalemia    Pulmonary edema    Hypervolemia    Elevated troponin    Chest pain     Nausea vomiting and diarrhea    Breast pain, left    Syncope    Hypertensive emergency       Past Medical/Surgical History:  Past Medical History:   Diagnosis Date    Chronic obstructive pulmonary disease     Diabetes mellitus     Hyperlipidemia     Hypertension     Sleep apnea 2018     Past Surgical History:   Procedure Laterality Date    EGD, BIOPSY N/A 05/14/2019    Procedure: EGD, BIOPSY;  Surgeon: Ronie Spies, MD;  Location: [facility];  Service: Gastroenterology;  Laterality: N/A;    EXPLORATORY LAPAROTOMY N/A 05/27/2019    Procedure: EXPLORATORY LAPAROTOMY;  Surgeon: Herbert Moors., MD;  Location: [facility];  Service: General;  Laterality: N/A;    JOINT REPLACEMENT      shoulder    JOINT REPLACEMENT      knee    OVARY SURGERY Left     Removal    REMOVAL, FOREIGN BODY N/A 05/27/2019    Procedure: REMOVAL, PERITONEAL DIALYSIS CATHETER;  Surgeon: Herbert Moors., MD;  [facility]: [facility];  Service: General;  Laterality: N/A;    TUNNELED CATH CHECK/CHANGE (PERMCATH) N/A 07/03/2019    Procedure: TUNNELED CATH CHECK/CHANGE;  Surgeon: Maureen Ralphs, MD;  Location: [facility];  Service: Interventional Radiology;  Laterality: N/A;    TUNNELED CATH PLACEMENT (PERMCATH) Bilateral 05/23/2019    Procedure: TUNNELED CATH PLACEMENT;  Surgeon: Jacques Earthly, MD;  [facility];  Service: Interventional Radiology;  Laterality: Bilateral;    TUNNELED CATH PLACEMENT (PERMCATH) Right 07/31/2019    Procedure: TUNNELED CATH PLACEMENT;  Surgeon: Jacques Earthly, MD;  [facility];  Service: Interventional Radiology;  Laterality: Right;       X-Rays/Tests/Labs:  Lab Results   Component Value Date/Time    HGB 11.3 (L) 03/04/2021 03:00 AM    HCT 37.3 03/04/2021 03:00 AM    K 6.2 (HH) 03/04/2021 03:00 AM    NA 136 03/04/2021 03:00 AM    INR 1.2 (H) 03/04/2021 01:23 AM    TROPI 102.0 (AA) 03/04/2021 12:07 AM    TROPI 109.2 (AA) 02/04/2021 06:45 AM    TROPI 11.0 02/04/2021 06:45 AM    TROPI  98.2 (AA) 02/04/2021 04:55 AM    TROPI 5.9 02/04/2021 04:55 AM    TROPI 92.3 (AA) 02/04/2021 02:27 AM    TROPI 0.01 03/18/2006 06:10 PM       All imaging reviewed, please see chart for details.    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 Nursing clears patient for therapy.  Patient Goal: to go home  Pain Assessment  Pain Assessment: Wong-Baker FACES  Wong-Baker FACES Pain Rating: Hurts whole lot  POSS Score: Awake and Alert  Pain Location: Toe (Comment which one) (big, little)  Pain Orientation: Left  Pain Descriptors: Patient unable to describe  Pain Frequency: Other (Comment);Increases with movement (on touch)  Effect of Pain on Daily Activities: mild  Pain Intervention(s): Medication (See eMAR);Repositioned;Distraction;Emotional support;Elevated  Multiple Pain Sites: No      Objective:  Observation of Patient/Vital Signs:  Blood pressure 164/81, pulse 83, temperature 97.5 F (36.4 C), resp. rate 22, height 1.549 m (5\' 1" ), weight 111.4 kg (245 lb 9.5 oz), SpO2 96 %.      Inspection/Posture: (P) morbid ovesity, jerky movements while supine in a bed, ataxia, lethrgic, easily to arouse    Cognitive Status and Neuro Exam:  Cognition/Neuro Status  Arousal/Alertness: Appropriate responses to stimuli  Attention Span: Appears intact  Orientation  Level: Oriented X4  Safety Awareness: (P) minimal verbal instruction  Motor Planning: (P) decreased processing speed;decreased initiation;ataxia  Coordination: (P) GMC impaired    Musculoskeletal Examination  Gross ROM  Right Lower Extremity ROM: within functional limits  Left Lower Extremity ROM: within functional limits  Gross Strength  Right Lower Extremity Strength: 3/5 (grosly)  Left Lower Extremity Strength: 3/5 (grossly)       Functional Mobility:  Functional Mobility  Supine to Sit: Minimal Assist;Increased Time;Increased Effort;to Right (trunk)  Scooting to EOB: Maximal Assist;Increased Time;Increased Effort  Sit to Stand: Minimal Assist;Increased Time;Increased Effort;bed elevated;with instruction for hand placement to increase safety  Transfers  Bed to Chair: Unable to assess (Comment) (decreased activity tolerance, pain)  Locomotion  Ambulation: Minimal Assist;with front-wheeled walker (3 ft by bedside)  Pattern: R foot decreased clearance;L foot decreased clearance;decreased cadence (araxic)     Balance  Balance  Sitting - Static: (P) Good  Sitting - Dynamic: (P) Good  Standing - Static: (P) Fair  Standing - Dynamic: (P) Fair    Participation and Activity Tolerance  Participation and Endurance  Participation Effort: (P) good  Endurance: (P) Tolerates 10 - 20 min exercise with multiple rests  [place] Dyspnea Scale: (P) 0 Dyspnea      Assessment:  [name] is a 66 y.o. female admitted 03/03/2021.  PT Assessment  Assessment: Decreased LE strength;Decreased safety/judgement during functional mobility;Decreased endurance/activity tolerance;Impaired coordination;Impaired motor control;Decreased functional mobility;Decreased balance;Gait impairment  Prognosis: Good;With home health nursing/aide;With continued PT status post acute discharge;24 hour supervision recommended  Progress: Improving as expected      Treatment:  Functional Mobility:  Scooting to EOB: Minimal Assist, Increased Time,  Increased Effort, and to Left, Sit to Supine: Minimal Assist, Increased Time, Increased Effort, and to Left, and Stand to Sit: Minimal Assist, Increased Time, Increased Effort, and other (comment) facilitation eccentric trunk control to decrease burden of care    Balance:  Sitting Balance: sitting reaching activities, with instruction, with support, and stand by assist and Standing Balance: patient education, standing weight shifting lateral planes, with instruction, and with support  Locomotion:  Ambulation: Minimal Assist, front-wheeled walker, and Performed additional 2 feet and Pattern: R foot decreased clearance, L foot decreased clearance, decreased cadence, and other (comment) ataxic gait    Therapeutic Exercise:  Straight Leg Raise 5, Hip Flexion 5, Hip Abduction 5, Knee AROM 5, and Ankle Pumps 10 . Initiated AAROM, progressed to AROM.    Education/Instruction/Safety:  Pt required increased time/effort and instruction for improved safety and technique t/o session.   All OOB activities and ambulation performed with use of gait belt for increased pt and therapist safety.    Discussed therapy plan of care while in the hospital and discharge recommendation/DME with pt verbalized agreement.   Encouraged continued functional mobility as tolerated with nursing staff assistance; pt verbalized understanding and agreement.  Educated the Patient to role of physical therapy, plan of care, goals of therapy and HEP, safety with mobility and ADLs, energy conservation techniques, discharge instructions, home safety with verbalized understanding .    Patient left in bed with all medical equipment in place and call bell and all personal items/needs within reach (of note, pt received without alarm in place).  RN presented in the room and notified of session outcome.        Plan:  Treatment/Interventions: Exercise, Gait training, Neuromuscular re-education, Functional transfer training, LE strengthening/ROM, Endurance  training, Equipment eval/education, Bed mobility, Compensatory technique education, Patient/family training  PT Frequency: 1-2x/wk  Risks/Benefits/POC Discussed with Pt/Family: With patient    PMP Activity: Step 4 - Dangle at Bedside  Distance Walked (ft) (Step 6,7): 5 Feet      Goals:  Goals  Goal Formulation: With patient  Time for Goal Acheivement: By time of discharge  Goals: Select goal  Pt Will Go Supine To Sit: with stand by assist, to maximize functional mobility and independence  Pt Will Transfer Bed/Chair: with rolling walker, with contact guard assist, to maximize functional mobility and independence  Pt Will Ambulate: 31-50 feet, with rolling walker, with contact guard assist, to maximize functional mobility and independence  Pt Will Perform Home Exer Program: with supervision, to maximize functional mobility and independence      Jake Seats, MS, MPT  Physical Therapist  [id number]  Monday 13:30 - 21:00  Tuesday, Wednesday 14:00-21:00  Thursday, Friday 10:30 - 21:00

## 2021-03-04 NOTE — Consults (Addendum)
[facility]  CONSULT  [id number]      Date Time: 03/04/21 1:39 PM  Patient Name: [name]  Requesting Physician: Barbaraann Faster, MD  Consulting Physician: Gaylyn Lambert, MD, MD    Primary Care Physician: Horton Marshall, MD    Reason for Consultation: ESRD       Assessment:     Patient Active Problem List   Diagnosis    Iron deficiency anemia secondary to inadequate dietary iron intake    Sideropenic dysphagia    Peritoneal dialysis catheter dysfunction, initial encounter    Peritonitis    Upper GI bleed    ESRD (end stage renal disease) on dialysis    Generalized abdominal pain    Thrombocytopenia    h/o orthostatic hypotension    Type 2 diabetes mellitus, with long-term current use of insulin    GERD (gastroesophageal reflux disease)    Bowel incontinence    Right sided numbness    Uncontrolled type 2 diabetes mellitus with hyperglycemia    Hypertension    Hyperlipidemia    History of COPD    Gastritis with hemorrhage    History of anemia due to chronic kidney disease    Open wound, abdominal wall, lateral, subsequent encounter    Gout    Dizziness    Fall    History of CVA (cerebrovascular accident)    Pneumonia due to COVID-19 virus    Diarrhea    Hypertensive urgency    Shortness of breath    ESRD (end stage renal disease)    Hyperkalemia    Pulmonary edema    Hypervolemia    Elevated troponin    Chest pain    Nausea vomiting and diarrhea    Breast pain, left    Syncope    Hypertensive emergency       ESRD on HD MWF at [facility] - followed by Dr. Randel Pigg  - Missed HD on 2/6, 2/8 due to not feeling well   - Last HD 1 week ago   Accelerated hypertension due to volume overload in the setting of missed HD  Volume overload - UF as needed  Anemia in CKD - Hgb on goal   Hyperphosphatemia - monitor , on sevelamer   Secondary hyperparathyroidism - monitor     Recommendations:     Dialyzed today per routine schedule, 3.5L net UF  UF treatment tomorrow aiming for additional 3.5-4L removal    Anticipate no needs for HD on Sunday; plan for routine HD on Monday   Continue sevelamer  Check PTH, Phos   Resume OP antihypertensive medications  Salt and fluid restriction, renal diet  Daily BMP         Barbaraann Faster, MD, thank you for this consultation.  We will follow the patient with you during this hospitalization.  Please contact me with any questions or issues.    Signed by: Gaylyn Lambert, MD  [facility]  [id number] (office)      -----------------------------------------------------------------------------------------------------------        History of Presenting Illness:     [name] is a 66 y.o. female with ESRD on HD at [facility], HTN, DM type 2 who presented to the hospital via EMS with 1 week history of progressive shortness of breath in the setting of missing HD on 2/6, 2/8 due to not feeling well. Her last HD was about a week ago on Friday. Her blood sugars ~ 500s and blood pressure 212/124 was very  elevated on admission. Nephrology consulted for provision of hemodialysis.     She was noted to be hypoxic and hyperkalemic. She was dialysed stat overnight with 3.5L off. Currently resting peacefully on 2L NC oxygen. BP in 150s. BMP pending    Dialysis Treatment Data:  Blood flow rate: 350 mL/min  Dialysate flow rate: 700 mL/min  Ultrafiltration rate: 300 mL/hr    Dialysate potassium: 1 mEq/L  Dialysate calcium: 2.5 mEq/L    Arterial pressure: -210 mmHg  Venous pressure: 18mmHg        Past Medical History:     Past Medical History:   Diagnosis Date    Chronic obstructive pulmonary disease     Diabetes mellitus     Hyperlipidemia     Hypertension     Sleep apnea 2018       Available old records reviewed, including:  labs, radiology, notes    Past Surgical History:     Past Surgical History:   Procedure Laterality Date    EGD, BIOPSY N/A 05/14/2019    Procedure: EGD, BIOPSY;  Surgeon: Ronie Spies, MD;  Location: [facility];  Service: Gastroenterology;  Laterality:  N/A;    EXPLORATORY LAPAROTOMY N/A 05/27/2019    Procedure: EXPLORATORY LAPAROTOMY;  Surgeon: Herbert Moors., MD;  [facility];  Service: General;  Laterality: N/A;    JOINT REPLACEMENT      shoulder    JOINT REPLACEMENT      knee    OVARY SURGERY Left     Removal    REMOVAL, FOREIGN BODY N/A 05/27/2019    Procedure: REMOVAL, PERITONEAL DIALYSIS CATHETER;  Surgeon: Herbert Moors., MD;  [facility]: [facility];  Service: General;  Laterality: N/A;    TUNNELED CATH CHECK/CHANGE (PERMCATH) N/A 07/03/2019    Procedure: TUNNELED CATH CHECK/CHANGE;  Surgeon: Maureen Ralphs, MD;  [facility];  Service: Interventional Radiology;  Laterality: N/A;    TUNNELED CATH PLACEMENT (PERMCATH) Bilateral 05/23/2019    Procedure: TUNNELED CATH PLACEMENT;  Surgeon: Jacques Earthly, MD;  [facility];  Service: Interventional Radiology;  Laterality: Bilateral;    TUNNELED CATH PLACEMENT (PERMCATH) Right 07/31/2019    Procedure: TUNNELED CATH PLACEMENT;  Surgeon: Jacques Earthly, MD;  [facility];  Service: Interventional Radiology;  Laterality: Right;       Family History:     Family History   Problem Relation Age of Onset    Diabetes Mother     Hypertension Mother     Diabetes Father     Hypertension Father     Diabetes Sister     Hypertension Sister     Diabetes Brother     Hypertension Brother     Diabetes Paternal Aunt     Diabetes Paternal Uncle        Social History:     Social History     Socioeconomic History    Marital status: Legally Separated     Spouse name: Not on file    Number of children: Not on file    Years of education: Not on file    Highest education level: Not on file   Occupational History    Not on file   Tobacco Use    Smoking status: Never    Smokeless tobacco: Never   Vaping Use    Vaping Use: Never used   Substance and Sexual Activity    Alcohol use: Never    Drug use: Never  Sexual activity: Not Currently   Other Topics Concern    Not on file   Social  History Narrative    Not on file     Social Determinants of Health     Financial Resource Strain: Not on file   Food Insecurity: Not on file   Transportation Needs: Not on file   Physical Activity: Not on file   Stress: Not on file   Social Connections: Not on file   Intimate Partner Violence: Not on file   Housing Stability: Not on file       Allergies:     Allergies   Allergen Reactions    Lisinopril Hives    Metformin Hives       Medications:     Medications Prior to Admission   Medication Sig    acetaminophen (TYLENOL) 500 MG tablet Take 500 mg by mouth every 6 (six) hours as needed for Pain    albuterol sulfate HFA (PROVENTIL) 108 (90 Base) MCG/ACT inhaler Inhale 2 puffs into the lungs every 6 (six) hours as needed for Wheezing or Shortness of Breath    amLODIPine (NORVASC) 10 MG tablet Take 1 tablet by mouth daily    aspirin 81 MG chewable tablet Chew 81 mg by mouth daily    atorvastatin (LIPITOR) 40 MG tablet Take 40 mg by mouth daily    carvedilol (COREG) 25 MG tablet Take 1 tablet by mouth 2 (two) times daily with meals    dextromethorphan-guaiFENesin (MUCINEX DM) 30-600 MG per 12 hr tablet Take 1 tablet by mouth every 12 (twelve) hours    gabapentin (NEURONTIN) 600 MG tablet Take 1 capsule by mouth every 8 (eight) hours    insulin aspart (NovoLOG) 100 UNIT/ML injection Inject 15 Units into the skin 3 (three) times daily before meals    Insulin Degludec (TRESIBA SC) Inject 35 Unit into the skin    pantoprazole (PROTONIX) 40 MG tablet Take 40 mg by mouth daily    sevelamer (RENVELA) 800 MG tablet Take 800 mg by mouth 2 (two) times daily with meals        Scheduled Meds: PRN Meds:    amLODIPine, 10 mg, Oral, Daily  aspirin, 81 mg, Oral, Daily  atorvastatin, 40 mg, Oral, Daily  calcium GLUConate, 1 g, Intravenous, Once  carvedilol, 12.5 mg, Oral, Q12H SCH  heparin (porcine), 5,000 Units, Subcutaneous, Q8H SCH  insulin glargine, 20 Units, Subcutaneous, Daily  insulin lispro, 1-6 Units, Subcutaneous,  QHS  insulin lispro, 2-10 Units, Subcutaneous, TID AC  insulin regular, 5 Units, Intravenous, Once  pantoprazole, 40 mg, Oral, QAM AC  petrolatum, , Topical, Q12H  senna-docusate, 1 tablet, Oral, Q12H SCH  sodium bicarbonate, 50 mEq, Intravenous, Once          Continuous Infusions:   acetaminophen, 650 mg, Q6H PRN  dextrose, 15 g of glucose, PRN   And  dextrose, 12.5 g, PRN   And  dextrose, 12.5 g, PRN   And  glucagon (rDNA), 1 mg, PRN  hydrALAZINE, 10 mg, Q6H PRN  oxyCODONE, 5 mg, Q4H PRN            Review of Systems:   A comprehensive review of systems was per the HPI and below:     General ROS: no f/c, no weight changes  HEENT ROS: no blurry vision, no oral lesions, no epistaxis  Allergy/Immunology ROS: no new allergic reactions  Hematological and Lymphatic ROS: no known bleeding/clotting disorders  Respiratory ROS: negative for cough, shortness of  breath, or wheezing  Cardiovascular ROS: negative for chest pain or dyspnea on exertion  Gastrointestinal ROS: negative for abd/flank pain, change in bowel habits  Genito-Urinary ROS: negative for dysuria, hematuria, difficulty voiding or nocturia  Musculoskeletal ROS:  negative for trauma or falls, arthralgias  Neurological ROS: no focal weakness, no dizziness  Endocrine ROS: no change in libido, no change in hair distribution  Dermatological ROS: no new rashes or lesions      Physical Exam:     Vitals:    03/04/21 0730 03/04/21 0754 03/04/21 0900 03/04/21 1044   BP: (!) 164/107 176/77 174/73 188/83   Pulse: 78 80 83 96   Resp: 16 15 22     Temp:  97.5 F (36.4 C)     TempSrc:       SpO2: 100% 97% 96%    Weight:       Height:           Intake and Output Summary (Last 24 hours) at Date Time    Intake/Output Summary (Last 24 hours) at 03/04/2021 1339  Last data filed at 03/04/2021 1470  Gross per 24 hour   Intake --   Output 3100 ml   Net -3100 ml       Recent weights:  Weight Monitoring 02/05/2021 02/05/2021 02/06/2021 02/07/2021 02/08/2021 03/04/2021 03/04/2021   Height - - -  - - - 154.9 cm   Height Method - - - - - - -   Weight 107.8 kg 99.791 kg 99.6 kg 99.791 kg 98.567 kg 112.537 kg 111.4 kg   Weight Method - Bed Scale Standing Scale Standing Scale Standing Scale Bed Scale Bed Scale   BMI (calculated) - - - - - - 46.5 kg/m2       General: awake, alert, oriented x 3, no acute distress  HEENT: sclera anicteric, oropharynx clear without lesions, mucous membranes moist  Neck: supple, no lymphadenopathy, no thyromegaly, no JVD, no carotid bruits  Cardiovascular: regular rate and rhythm, no murmurs, rubs or gallops  Lungs: clear to auscultation bilaterally, without wheezing, rhonchi, or rales  Abdomen: soft, non-tender, non-distended, normoactive bowel sounds, no palpable masses, no hepatosplenomegaly, no rebound or guarding  Extremities: no clubbing, cyanosis, or edema  Neuro: A+O x 3, no gross motor/sensory deficits  Skin: no rashes or lesions noted        Labs:     Recent Labs   Lab 03/04/21  0300 03/04/21  0123   WBC 5.43 5.36   Hgb 11.3* 11.6   Hematocrit 37.3 38.4   Platelets 92* 90*     Recent Labs   Lab 03/04/21  0300 03/04/21  0007   Sodium 136 135   Potassium 6.2* 6.9*   Chloride 100 102   CO2 19 18   BUN 99.0* 99.0*   Creatinine 11.4* 11.4*   Calcium 7.9* 7.7*   Albumin  --  2.8*   Glucose 517* 551*   EGFR 4.0 4.0             Invalid input(s): LEUKOCYTESUR        Imaging personally reviewed, including: n/a      cc: Barbaraann Faster, MD  Horton Marshall, MD

## 2021-03-04 NOTE — Progress Notes (Addendum)
Nutritional Support Services  Nutrition Assessment    [name] 66 y.o. female   MRN: [id number]    Summary of Nutrition Recommendations:     1. Continue Consistent Carbohydrate, Renal diet, no protein restriction    2. Monitor need for ONS pending further intake    -----------------------------------------------------------------------------------------------------------------    D/w RN                                                        Assessment Data:   Referral Source: RN Screen  Reason for Referral: MST - 3 (unsure weight loss, decreased appetite)    Nutrition: Patient seen at bedside, awake. Pt reports good appetite at baseline, usually is a grazer. Patient reporting decreased appetite "for days" possibly 3-7 days PTA. Pt reports not being able to eat or drink anything except orange juice during this time. Pt denies N/V/D/C during this time. Noted pt with some missing teeth. Pt reporting improving appetite since admission. Lunch tray at bedside, salad 100% consumed, pasta 25% consumed d/t pt preference. Noted NPO orders in place, but suspect this may be in error d/t no indications for NPO identified at this time, confirmed with RN. MD provided order to reinitiate diet.    Learning Needs: pt denies need for education at time of visit    Hospital Admission: 66 yo female with PMHx ESRD on HD, HTN, DM, presenting with progressive dyspnea x 1 week.    Medical Hx:  has a past medical history of Chronic obstructive pulmonary disease, Diabetes mellitus, Hyperlipidemia, Hypertension, and Sleep apnea (2018).    PSH: has a past surgical history that includes Ovary surgery (Left); Joint replacement; Joint replacement; EGD, BIOPSY (N/A, 05/14/2019); Tunneled Cath Placement (Permcath) (Bilateral, 05/23/2019); REMOVAL, FOREIGN BODY (N/A, 05/27/2019); EXPLORATORY LAPAROTOMY (N/A, 05/27/2019); Tunneled Cath Check/Change (Permcath) (N/A, 07/03/2019); and Tunneled Cath Placement (Permcath) (Right, 07/31/2019).     Orders  Placed This Encounter   Procedures    Diet NPO effective now       Intake: no documented intake      ANTHROPOMETRIC  Height: 154.9 cm (5\' 1" )  Weight: 111.4 kg (245 lb 9.5 oz)  Weight Change: -1.01  IBW: 47.6 kg  Body mass index is 46.4 kg/m.     Weight Monitoring Weight Weight Method   10/20/2020 100 kg Estimated   10/21/2020 108 kg    10/21/2020 107 kg Bed Scale   10/22/2020 108.2 kg Bed Scale   10/23/2020 108.1 kg Bed Scale   10/25/2020 80.876 kg Bed Scale   11/12/2020 100.5 kg    11/13/2020 100.5 kg Standing Scale   01/05/2021 74.844 kg Stated   01/06/2021 74.844 kg Bed Scale   01/07/2021 74.9 kg Bed Scale   01/08/2021 103.057 kg Standing Scale   01/09/2021 94.7 kg Bed Scale   01/10/2021 94.8 kg Bed Scale   01/11/2021 94.8 kg Bed Scale   01/12/2021 93.8 kg Bed Scale   02/04/2021 93.895 kg    02/04/2021 108.1 kg    02/04/2021 108.1 kg    02/05/2021 107.8 kg    02/05/2021 99.791 kg Bed Scale   02/06/2021 99.6 kg Standing Scale   02/07/2021 99.791 kg Standing Scale   02/08/2021 98.567 kg Standing Scale   03/04/2021 112.537 kg Bed Scale   03/04/2021 111.4 kg Bed Scale  Weight History Summary: Pt reports UBW between 92.5-97.5kg, weight flux r/t fluid, and feels that she has been gaining weight r/t fluid recently. Per chart, pt weighing around UBW in December, has since been gaining weight. Will continue to monitor.     Physical Assessment: 03/04/21  Head: no overt s/s subcutaneous muscle or fat loss  Upper Body: no overt s/s subcutaneous muscle or fat loss  Lower Body: no s/s subcutaneous fat or muscle loss  Edema: RLE +1 per flow sheet and assessment  Skin: toe wounds per flow sheet  GI function: WDL per flow sheet; pt reports BM today s/p laxative    ESTIMATED NEEDS    Total Daily Energy Needs: 4401 to 2127.5 kcal  Method for Calculating Energy Needs: 18 kcal - 23 kcal per kg  at 92.5 kg (Usual body weight)  Rationale: obese, noncritical, HD       Total Daily Protein Needs: 80.92 to 104.72 g  Method for Calculating Protein  Needs: 1.7 g - 2.2 g per kg at 47.6 kg (Ideal body weight)  Rationale: obese, noncritical, HD      Total Daily Fluid Needs: 952 to 1190 ml  Method for Calculating Fluid Needs: 20 ml - 25 ml  per kg at 47.6 kg (Ideal body weight)  Rationale: HD; or per MD      Pertinent Medications:  SSI, bicarb, humulin, protonix, lantus, pericolace, kayexalate, solumedrol      Allergies   Allergen Reactions    Lisinopril Hives    Metformin Hives         Pertinent labs:  Lab 03/04/21  1413 03/04/21  0300 03/04/21  0007   Potassium 4.0 6.2* 6.9*   BUN 55.0* 99.0* 99.0*   Creatinine 7.6* 11.4* 11.4*   Glucose 343* 517* 551*   EGFR 6.5 4.0 4.0                                                                 Nutrition Diagnosis      Inadequate oral intake related to decreased appetite, N/V as evidenced by <50% EER x 3-7 days  (New)                                                               Intervention     Nutrition recommendation - Please refer to top of note                                                               Monitoring/Evaluation     Goals:     1. Patient will consume >/=75% of nutritional needs via meals by next RD follow up - new        Nutrition Risk Level: Moderate (will follow up at least 1 time per week and PRN)      Parke Simmers, RD, CNSC  Clinical  Dietitian  [phone]

## 2021-03-04 NOTE — Plan of Care (Signed)
Problem: Moderate/High Fall Risk Score >5  Goal: Patient will remain free of falls  Outcome: Progressing  Flowsheets (Taken 03/04/2021 1000)  High (Greater than 13):   HIGH-Utilize chair pad alarm for patient while in the chair   HIGH-Pharmacy to initiate evaluation and intervention per protocol   HIGH-Apply yellow "Fall Risk" arm band   HIGH-Initiate use of floor mats as appropriate   HIGH-Consider use of low bed     Problem: Renal Instability  Goal: Fluid and electrolyte balance are achieved/maintained  Outcome: Progressing  Flowsheets (Taken 03/04/2021 0517 by Venia Minks, RN)  Fluid and electrolyte balance are achieved/maintained:   Monitor intake and output every shift   Monitor/assess lab values and report abnormal values   Provide adequate hydration   Monitor daily weight   Assess for confusion/personality changes   Assess and reassess fluid and electrolyte status   Observe for seizure activity and initiate seizure precautions if indicated   Observe for cardiac arrhythmias   Monitor for muscle weakness   Follow fluid restrictions/IV/PO parameters     Problem: Fluid and Electrolyte Imbalance/ Endocrine  Goal: Fluid and electrolyte balance are achieved/maintained  Outcome: Progressing

## 2021-03-04 NOTE — Progress Notes (Signed)
Arrived in ICU, pt is alert and oriented, Pt denied pain and sob. Report given by primary RN pre-dialysis.  LAVF access is optimally working.  Nephrologist called HD RN on-call for urgent HD tx d/t hyperkalemia. Timeouts and safety checks done. Will closely monitor the pt.       03/04/21 0416   Bedside Nurse Communication   Name of bedside RN - pre dialysis Venia Minks, RN.   Treatment Initiation- With Dialysis Precautions   Time Out/Safety Check Completed Yes   Consent for HD signed for this hospitalization (Date) 02/04/21   Consent for HD signed for this hospitalization (Time) 1450   Blood Consent Verified N/A   Dialysis Precautions All Connections Secured;Saline Line Double Clamped;Venous Parameters Set;Arterial Parameters Set;Air Foam Detecctor Engaged   Dialysis Treatment Type Urgent;ICU/CCU   Special Considerations Hyperkalemia, Contact isolation.   Is patient diabetic? Yes   RO/Hemodialysis Architectural technologist   Is Total Chlorine less than 0.1 ppm? Yes   Orignial Total Chlorine Testing Time 0335   At 4 Hour Total Chlorine Testing Time 0735   RO/Hemodialysis Facilities manager Number [id number]    Machine Serial Number [id number]   RO # 26   RO Serial # [id number]   pH 7.2   Pressure Test Verified Yes   Alarms Verified Passed   Machine Temperature 98.6 F (37 C)   Alarms Verified Yes   Na+ mEq (Machine) 138 mEq   Bicarb mEq (Machine) 35 mEq   Hemodialysis Conductivity (Machine) 13.8   Hemodialysis Conductivity (Meter) 13.8   Dialyzer Lot Number [id number]   Tubing Lot Number [id number]   Hepatitis Status   HBsAg (Antigen) Result Negative   HBsAg Date Drawn 02/04/21   HBsAg Repeat Draw Due Date 03/04/21   Dialysis Weight   Pre-Treatment Weight (Kg) 111.4   Scale Type ICU Bed Scale   Vitals   Temp 98.6 F (37 C)   Heart Rate 79   Resp Rate 22   BP (!) 192/95   SpO2 100 %   O2 Device Nasal cannula   Assessment   Mental Status Alert;Oriented   Cardiac Regularity Regular   Cardiac Rhythm Normal Sinus Rhythm    Respiratory Pattern Regular   Bilateral Breath Sounds Clear;Diminished   RLE Edema +1   LLE Edema +1   General Skin Color Appropriate for ethnicity   Skin Condition/Temp Warm;Dry   Gastrointestinal (WDL) WDL   Mobility Bed   Graft/Fistula Left   No placement date or time found.   Access Type: Arteriovenous Fistula  Orientation: Left  Graft/Fistula Location: Upper Arm   Fistula/ Graft Assessment Abnormalities WDL   Pain Assessment   Charting Type Assessment   Pain Scale Used Numeric Scale (0-10)   Numeric Pain Scale   Pain Score 0   POSS Score 1   Hemodialysis Comments   Pre-Hemodialysis Comments Timeout and safety checks done

## 2021-03-04 NOTE — ED Provider Notes (Shared)
EMERGENCY DEPARTMENT HISTORY AND PHYSICAL EXAM     None        MDM/Provider Note:       Medical Decision Making  Problems Addressed:  ESRD (end stage renal disease) on dialysis: chronic illness or injury with severe exacerbation, progression, or side effects of treatment  Hyperkalemia: acute illness or injury that poses a threat to life or bodily functions  Hypertensive urgency: acute illness or injury with systemic symptoms that poses a threat to life or bodily functions  Non compliance with medical treatment: acute illness or injury that poses a threat to life or bodily functions    Amount and/or Complexity of Data Reviewed  External Data Reviewed: notes.  Labs: ordered. Decision-making details documented in ED Course.  Radiology: ordered. Decision-making details documented in ED Course.  ECG/medicine tests: ordered and independent interpretation performed. Decision-making details documented in ED Course.    Risk  OTC drugs.  Prescription drug management.  Decision regarding hospitalization.  Diagnosis or treatment significantly limited by social determinants of health.            ED Course as of 03/05/21 2237   Fri Mar 04, 2021   0142 Dr. Scharlene Corn, nephrologist, will call for emergent HD [AH]   [phone] D/w Dr. Carmine Savoy, icu, accept admission.  [AH]      ED Course User Index  [AH] Dossie Ocanas, Wynell Balloon, MD PhD         CARDIAC STUDIES        EKG Interpretation:    23:49 NSR at 81 bpm, NAD, no LVH, qrs 72, qtc 466 (-) ST-T changes similar to ekg on 02/04/2021 as read by me.     DDX/A/P:    H/o copd, htn, hld, dm brought here by ems b/c of worsening sob. Esrd on hd mwf, last HD over 1 wk ago. Pt missed the recent two session b/c she wasn't feeling well. Exam: appears fatigued    Ddx: htn emergency, fluid overload, electrolyte abn, nstemi, copd exacerbation    Admit for HD, tx htn      HISTORY OF PRESENT ILLNESS     Translator Used: No    Historian: Patient    66 y.o. female H/o copd, htn, hld, dm brought here by ems b/c of  worsening sob. Esrd on hd mwf, last HD over 1 wk ago. Pt missed the recent two session b/c she wasn't feeling well.        MEDICAL HISTORY     Past Medical History:  Past Medical History:   Diagnosis Date    Chronic obstructive pulmonary disease     Diabetes mellitus     Hyperlipidemia     Hypertension     Sleep apnea 2018       Past Surgical History:  Past Surgical History:   Procedure Laterality Date    EGD, BIOPSY N/A 05/14/2019    Procedure: EGD, BIOPSY;  Surgeon: Ronie Spies, MD;  Location: [facility];  Service: Gastroenterology;  Laterality: N/A;    EXPLORATORY LAPAROTOMY N/A 05/27/2019    Procedure: EXPLORATORY LAPAROTOMY;  Surgeon: Herbert Moors., MD;  Location: [facility];  Service: General;  Laterality: N/A;    JOINT REPLACEMENT      shoulder    JOINT REPLACEMENT      knee    OVARY SURGERY Left     Removal    REMOVAL, FOREIGN BODY N/A 05/27/2019    Procedure: REMOVAL, PERITONEAL DIALYSIS CATHETER;  Surgeon: Kalman Jewels  Maury Dus., MD;  [facility];  Service: General;  Laterality: N/A;    TUNNELED CATH CHECK/CHANGE (PERMCATH) N/A 07/03/2019    Procedure: TUNNELED CATH CHECK/CHANGE;  Surgeon: Maureen Ralphs, MD;  Location: [facility];  Service: Interventional Radiology;  Laterality: N/A;    TUNNELED CATH PLACEMENT ([facility]ATH) Bilateral 05/23/2019    Procedure: TUNNELED CATH PLACEMENT;  Surgeon: Jacques Earthly, MD;  Location: [facility];  Service: Interventional Radiology;  Laterality: Bilateral;    TUNNELED CATH PLACEMENT ([facility]ATH) Right 07/31/2019    Procedure: TUNNELED CATH PLACEMENT;  Surgeon: Jacques Earthly, MD;  Location: [facility];  Service: Interventional Radiology;  Laterality: Right;       Social History:  Social History     Socioeconomic History    Marital status: Legally Separated   Tobacco Use    Smoking status: Never    Smokeless tobacco: Never   Vaping Use    Vaping Use: Never used   Substance and Sexual Activity    Alcohol use: Never    Drug use: Never    Sexual  activity: Not Currently       Family History:  Family History   Problem Relation Age of Onset    Diabetes Mother     Hypertension Mother     Diabetes Father     Hypertension Father     Diabetes Sister     Hypertension Sister     Diabetes Brother     Hypertension Brother     Diabetes Paternal Aunt     Diabetes Paternal Uncle        Allergies:  Allergies   Allergen Reactions    Lisinopril Hives    Metformin Hives       Outpatient Medication:  Current Discharge Medication List        CONTINUE these medications which have NOT CHANGED    Details   acetaminophen (TYLENOL) 500 MG tablet Take 500 mg by mouth every 6 (six) hours as needed for Pain      albuterol sulfate HFA (PROVENTIL) 108 (90 Base) MCG/ACT inhaler Inhale 2 puffs into the lungs every 6 (six) hours as needed for Wheezing or Shortness of Breath      amLODIPine (NORVASC) 10 MG tablet Take 1 tablet by mouth daily      aspirin 81 MG chewable tablet Chew 81 mg by mouth daily      atorvastatin (LIPITOR) 40 MG tablet Take 40 mg by mouth daily      carvedilol (COREG) 25 MG tablet Take 1 tablet by mouth 2 (two) times daily with meals      dextromethorphan-guaiFENesin (MUCINEX DM) 30-600 MG per 12 hr tablet Take 1 tablet by mouth every 12 (twelve) hours  Qty: 14 tablet, Refills: 1      gabapentin (NEURONTIN) 600 MG tablet Take 1 capsule by mouth every 8 (eight) hours      insulin aspart (NovoLOG) 100 UNIT/ML injection Inject 15 Units into the skin 3 (three) times daily before meals      Insulin Degludec (TRESIBA SC) Inject 35 Unit into the skin      pantoprazole (PROTONIX) 40 MG tablet Take 40 mg by mouth daily      sevelamer (RENVELA) 800 MG tablet Take 800 mg by mouth 2 (two) times daily with meals               PHYSICAL EXAM     03/03/21 2341 196/98 (Abnormal)   83 11 (Abnormal)   98.2 F (  36.8 C) -- 100 %         Nursing note and vitals reviewed.  Constitutional:  Well developed, well nourished.  Head:  Atraumatic. Normocephalic.    Eyes:  Conjunctivae are not  pale.  ENT:  Mucous membranes are moist and intact. Patent airway.  Neck:  Supple. Full ROM.    Cardiovascular:  Regular rate. Regular rhythm. Left arm av fistula good thrill.   Respiratory:  No evidence of respiratory distress.   GI:  Soft and non-distended. There is no tenderness. No rebound, guarding, or rigidity.  Back:  Full ROM. Nontender.  MSK:  No edema. No cyanosis. No clubbing. Full range of motion in all extremities.  Skin:  Skin is warm and dry.  No diaphoresis. No rash.   Neurological:  Alert, awake, and appropriate. Normal speech. Motor normal.  Psychiatric:  Good eye contact. Normal interaction, affect, and behavior.          MEDICAL DECISION MAKING     Vital Signs: Reviewed the patient's vital signs.   Nursing Notes: Reviewed and utilized available nursing notes.  Medical Records Reviewed: Reviewed available past medical records.      PROCEDURES      IMAGING STUDIES      Chest AP Portable   Final Result      Suspect mild recurrent pulmonary edema.      Keith Rake, MD    03/04/2021 1:36 AM           PULSE OXIMETRY    Oxygen Saturation by Pulse Oximetry: 98%  Interventions: none  Interpretation: normal     EMERGENCY DEPT. MEDICATIONS      ED Medication Orders (From admission, onward)      Start Ordered     Status Ordering Provider    03/04/21 0900 03/04/21 0201    Daily        Route: Subcutaneous  Ordered Dose: 30 mg     Discontinued AL [place], HUSAM T    03/04/21 0900 03/04/21 0205  amLODIPine (NORVASC) tablet 10 mg  Daily        Route: Oral  Ordered Dose: 10 mg     Last MAR action: Given AL [place], HUSAM T    03/04/21 0900 03/04/21 0205  aspirin chewable tablet 81 mg  Daily        Route: Oral  Ordered Dose: 81 mg     Last MAR action: Given AL [place], HUSAM T    03/04/21 0900 03/04/21 0205  atorvastatin (LIPITOR) tablet 40 mg  Daily        Route: Oral  Ordered Dose: 40 mg     Last MAR action: Given AL [place], HUSAM T    03/04/21 0745 03/04/21 0205  pantoprazole (PROTONIX) EC tablet 40 mg  Every  morning before breakfast        Route: Oral  Ordered Dose: 40 mg     Last MAR action: Given AL [place], HUSAM T    03/04/21 0400 03/04/21 0202    Every 4 hours scheduled        Route: Subcutaneous  Ordered Dose: 1-8 Units    See Hyperspace for full Linked Orders Report.    Discontinued AL [place], HUSAM T    03/04/21 0344 03/04/21 0343  potassium chloride 10 mEq in 100 mL IVPB (premix)  Every 1 hour        Route: Intravenous  Ordered Dose: 10 mEq     Last MAR action: Not Given Ranelle Oyster  B    03/04/21 0342 03/04/21 0341  sodium bicarbonate 8.4 % injection 50 mEq  Once        Route: Intravenous  Ordered Dose: 50 mEq     Last MAR action: Not Given Larose Kells    03/04/21 0341 03/04/21 0340    Once        Route: Intravenous  Ordered Dose: 50 g    See Hyperspace for full Linked Orders Report.    Discontinued Ranelle Oyster B    03/04/21 0341 03/04/21 0340    Once        Route: Intravenous  Ordered Dose: 5 Units    See Hyperspace for full Linked Orders Report.    Discontinued Ranelle Oyster B    03/04/21 0341 03/04/21 0340  calcium gluconate 1 g in 50 mL premix  Once        Route: Intravenous  Ordered Dose: 1 g     Last MAR action: Not Given Larose Kells    03/04/21 0304 03/04/21 0304  sodium chloride 0.9 % bolus 100 mL  Every 1 hour PRN        Route: Intravenous  Ordered Dose: 100 mL     Acknowledged REGUNATHAN-SHENK, RENU    03/04/21 0304 03/04/21 0304  sodium chloride 0.9 % bolus 250 mL  As needed        Route: Intravenous  Ordered Dose: 250 mL     Acknowledged REGUNATHAN-SHENK, RENU    03/04/21 0304 03/04/21 0304  albumin human 25 % bag 25 g  As needed        Route: Intravenous  Ordered Dose: 100 mL     Acknowledged REGUNATHAN-SHENK, RENU    03/04/21 0203 03/04/21 0202  insulin glargine (LANTUS) injection 20 Units  Daily        Route: Subcutaneous  Ordered Dose: 20 Units     Last MAR action: Given AL [place], HUSAM T    03/04/21 0202 03/04/21 0201  senna-docusate (PERICOLACE) 8.6-50 MG per tablet 1 tablet  Every  12 hours scheduled        Route: Oral  Ordered Dose: 1 tablet     Last MAR action: Given AL [place], HUSAM T    03/04/21 0201 03/04/21 0202    As needed        Route: Oral  Ordered Dose: 15 g of glucose    See Hyperspace for full Linked Orders Report.    Discontinued AL [place], HUSAM T    03/04/21 0201 03/04/21 0202    As needed        Route: Intravenous  Ordered Dose: 12.5 g    See Hyperspace for full Linked Orders Report.    Discontinued AL [place], HUSAM T    03/04/21 0201 03/04/21 0202    As needed        Route: Intravenous  Ordered Dose: 12.5 g    See Hyperspace for full Linked Orders Report.    Discontinued AL [place], HUSAM T    03/04/21 0201 03/04/21 0202    As needed        Route: Intramuscular  Ordered Dose: 1 mg    See Hyperspace for full Linked Orders Report.    Discontinued AL [place], HUSAM T    03/04/21 0201 03/04/21 0201    Every 6 hours PRN        Route: Intravenous  Ordered Dose: 10 mg     Discontinued AL [place], HUSAM T    03/04/21  0159 03/04/21 0201  oxyCODONE (ROXICODONE) immediate release tablet 5 mg  Every 4 hours PRN        Route: Oral  Ordered Dose: 5 mg     Acknowledged AL [place]AR[place], HUSAM T    03/04/21 0159 03/04/21 0201  acetaminophen (TYLENOL) tablet 650 mg  Every 6 hours PRN        Route: Oral  Ordered Dose: 650 mg     Acknowledged AL SAMARAH, HUSAM T    03/04/21 0137 03/04/21 0136  hydrALAZINE (APRESOLINE) injection 10 mg  Once        Route: Intravenous  Ordered Dose: 10 mg     Last MAR action: Given Reita Shindler S    03/04/21 0107 03/04/21 0106  calcium GLUConate 10 % injection 1 g  Once        Route: Intravenous  Ordered Dose: 1 g     Last MAR action: Given Xia Stohr S    03/04/21 0107 03/04/21 0106  sodium bicarbonate 8.4 % injection 50 mEq  Once        Route: Intravenous  Ordered Dose: 50 mEq     Last MAR action: Given Stevie Ertle S    03/04/21 0107 03/04/21 0106  insulin regular (HumuLIN R) 5 Units intravenous injection  Once        Route: Intravenous  Ordered Dose: 5 Units    See  Hyperspace for full Linked Orders Report.    Last MAR action: Given Betheny Suchecki S    03/04/21 0107 03/04/21 0106  sodium polystyrene (KAYEXALATE) 15 GM/60ML suspension 15 g  Once        Route: Oral  Ordered Dose: 15 g     Last MAR action: Given Janzen Sacks S    03/04/21 0006 03/04/21 0005  labetalol (NORMODYNE,TRANDATE) injection 10 mg  Once        Route: Intravenous  Ordered Dose: 10 mg     Last MAR action: Given Trishia Cuthrell S    03/04/21 0006 03/04/21 0005  albuterol (PROVENTIL) (2.5 MG/3ML) 0.083% nebulizer solution 5 mg  RT - Once        Route: Nebulization  Ordered Dose: 5 mg     Last MAR action: Given Alline Pio S    03/04/21 0006 03/04/21 0005  methylPREDNISolone sodium succinate (Solu-MEDROL) injection 125 mg  Once        Route: Intravenous  Ordered Dose: 125 mg     Last MAR action: Given Carry Ortez S            LABORATORY RESULTS    Ordered and independently interpreted AVAILABLE laboratory tests. Please see results section in chart for full details.  Results for orders placed or performed during the hospital encounter of 03/03/21   COVID-19 (SARS-CoV-2) and Influenza A/B, NAA ([facility])- Admission    Specimen: Nasopharyngeal; Culturette   Result Value Ref Range    Purpose of COVID testing Diagnostic -PUI     SARS-CoV-2 Specimen Source Nasal Swab     SARS CoV 2 Overall Result Not Detected     Influenza A Not Detected     Influenza B Not Detected    Comprehensive metabolic panel   Result Value Ref Range    Glucose 551 (HH) 70 - 100 mg/dL    BUN 99.0 (H) 7.0 - 21.0 mg/dL    Creatinine 11.4 (H) 0.4 - 1.0 mg/dL    Sodium 135 135 - 145 mEq/L    Potassium 6.9 (HH) 3.5 -  5.3 mEq/L    Chloride 102 99 - 111 mEq/L    CO2 18 17 - 29 mEq/L    Calcium 7.7 (L) 8.5 - 10.5 mg/dL    Protein, Total 6.1 6.0 - 8.3 g/dL    Albumin 2.8 (L) 3.5 - 5.0 g/dL    AST (SGOT) 14 5 - 41 U/L    ALT 20 0 - 55 U/L    Alkaline Phosphatase 203 (H) 37 - 117 U/L    Bilirubin, Total 0.4 0.2 - 1.2 mg/dL    Globulin 3.3 2.0 - 3.6 g/dL     Albumin/Globulin Ratio 0.8 (L) 0.9 - 2.2    Anion Gap 15.0 5.0 - 15.0   High Sensitivity Troponin-I at 0 hrs   Result Value Ref Range    hs Troponin-I 102.0 (AA) SEE BELOW ng/L   GFR   Result Value Ref Range    EGFR 4.0    Lactic Acid   Result Value Ref Range    Lactic Acid 1.3 0.2 - 2.0 mmol/L   Lactic Acid   Result Value Ref Range    Lactic Acid 1.4 0.2 - 2.0 mmol/L   Arterial Blood Gas (ABG)   Result Value Ref Range    pH, Arterial 7.307 (L) 7.350 - 7.450    pCO2, Arterial 38.1 35.0 - 45.0 mmhg    pO2, Arterial 93.7 (H) 80.0 - 90.0 mmhg    HCO3, Arterial 18.5 (L) 23.0 - 29.0 mEq/L    Arterial Total CO2 38.6 (H) 24.0 - 30.0 mEq/L    Base Excess, Arterial -6.8 (L) -2.0 - 2.0 mEq/L    O2 Sat, Arterial 96.2 95.0 - 100.0 %    ABG CollectionSite Right Radl     Allen's Test Yes     Temperature 37.0     FIO2 28 %    Status Oxygen     O2 Delivery Nasal Cannula     O2 Flow 2.0 L/min   PT/APTT   Result Value Ref Range    PT 13.6 (H) 10.1 - 12.9 sec    PT INR 1.2 (H) 0.9 - 1.1    PTT 30 27 - 39 sec   CBC and differential   Result Value Ref Range    WBC 5.36 3.10 - 9.50 x10 3/uL    Hgb 11.6 11.4 - 14.8 g/dL    Hematocrit 38.4 34.7 - 43.7 %    Platelets 90 (L) 142 - 346 x10 3/uL    RBC 4.08 3.90 - 5.10 x10 6/uL    MCV 94.1 78.0 - 96.0 fL    MCH 28.4 25.1 - 33.5 pg    MCHC 30.2 (L) 31.5 - 35.8 g/dL    RDW 14 11 - 15 %    MPV 13.2 (H) 8.9 - 12.5 fL    Instrument Absolute Neutrophil Count 3.66 1.10 - 6.33 x10 3/uL    Neutrophils 68.2 None %    Lymphocytes Automated 18.1 None %    Monocytes 11.2 None %    Eosinophils Automated 1.5 None %    Basophils Automated 0.6 None %    Immature Granulocytes 0.4 None %    Nucleated RBC 0.0 0.0 - 0.0 /100 WBC    Neutrophils Absolute 3.66 1.10 - 6.33 x10 3/uL    Lymphocytes Absolute Automated 0.97 0.42 - 3.22 x10 3/uL    Monocytes Absolute Automated 0.60 0.21 - 0.85 x10 3/uL    Eosinophils Absolute Automated 0.08 0.00 - 0.44 x10 3/uL  Basophils Absolute Automated 0.03 0.00 - 0.08 x10 3/uL     Immature Granulocytes Absolute 0.02 0.00 - 0.07 x10 3/uL    Absolute NRBC 0.00 0.00 - 0.00 x10 3/uL   CBC and differential   Result Value Ref Range    WBC 5.43 3.10 - 9.50 x10 3/uL    Hgb 11.3 (L) 11.4 - 14.8 g/dL    Hematocrit 37.3 34.7 - 43.7 %    Platelets 92 (L) 142 - 346 x10 3/uL    RBC 3.95 3.90 - 5.10 x10 6/uL    MCV 94.4 78.0 - 96.0 fL    MCH 28.6 25.1 - 33.5 pg    MCHC 30.3 (L) 31.5 - 35.8 g/dL    RDW 14 11 - 15 %    MPV 12.5 8.9 - 12.5 fL    Instrument Absolute Neutrophil Count 3.65 1.10 - 6.33 x10 3/uL    Neutrophils 67.1 None %    Lymphocytes Automated 19.2 None %    Monocytes 11.6 None %    Eosinophils Automated 1.3 None %    Basophils Automated 0.6 None %    Immature Granulocytes 0.2 None %    Nucleated RBC 0.0 0.0 - 0.0 /100 WBC    Neutrophils Absolute 3.65 1.10 - 6.33 x10 3/uL    Lymphocytes Absolute Automated 1.04 0.42 - 3.22 x10 3/uL    Monocytes Absolute Automated 0.63 0.21 - 0.85 x10 3/uL    Eosinophils Absolute Automated 0.07 0.00 - 0.44 x10 3/uL    Basophils Absolute Automated 0.03 0.00 - 0.08 x10 3/uL    Immature Granulocytes Absolute 0.01 0.00 - 0.07 x10 3/uL    Absolute NRBC 0.00 0.00 - 0.00 x10 3/uL   Basic Metabolic Panel   Result Value Ref Range    Glucose 517 (HH) 70 - 100 mg/dL    BUN 99.0 (H) 7.0 - 21.0 mg/dL    Creatinine 11.4 (H) 0.4 - 1.0 mg/dL    Calcium 7.9 (L) 8.5 - 10.5 mg/dL    Sodium 136 135 - 145 mEq/L    Potassium 6.2 (HH) 3.5 - 5.3 mEq/L    Chloride 100 99 - 111 mEq/L    CO2 19 17 - 29 mEq/L    Anion Gap 17.0 (H) 5.0 - 15.0   GFR   Result Value Ref Range    EGFR 4.0    Cell MorpHology   Result Value Ref Range    Cell Morphology Normal     Platelet Estimate Decreased (A)    Hepatitis B (HBV) Surface Antigen w/ Reflex to Confirmation   Result Value Ref Range    Hepatitis B Surface Antigen Non-Reactive Non Reactive   Basic Metabolic Panel   Result Value Ref Range    Glucose 343 (H) 70 - 100 mg/dL    BUN 55.0 (H) 7.0 - 21.0 mg/dL    Creatinine 7.6 (H) 0.4 - 1.0 mg/dL    Calcium  7.8 (L) 8.5 - 10.5 mg/dL    Sodium 138 135 - 145 mEq/L    Potassium 4.0 3.5 - 5.3 mEq/L    Chloride 101 99 - 111 mEq/L    CO2 23 17 - 29 mEq/L    Anion Gap 14.0 5.0 - 15.0   GFR   Result Value Ref Range    EGFR 6.5    CBC and differential   Result Value Ref Range    WBC 5.05 3.10 - 9.50 x10 3/uL    Hgb 10.9 (L) 11.4 - 14.8 g/dL  Hematocrit 34.9 34.7 - 43.7 %    Platelets 96 (L) 142 - 346 x10 3/uL    RBC 3.74 (L) 3.90 - 5.10 x10 6/uL    MCV 93.3 78.0 - 96.0 fL    MCH 29.1 25.1 - 33.5 pg    MCHC 31.2 (L) 31.5 - 35.8 g/dL    RDW 15 11 - 15 %    MPV 12.5 8.9 - 12.5 fL    Instrument Absolute Neutrophil Count 3.04 1.10 - 6.33 x10 3/uL    Neutrophils 60.1 None %    Lymphocytes Automated 24.6 None %    Monocytes 12.1 None %    Eosinophils Automated 2.2 None %    Basophils Automated 0.6 None %    Immature Granulocytes 0.4 None %    Nucleated RBC 0.0 0.0 - 0.0 /100 WBC    Neutrophils Absolute 3.04 1.10 - 6.33 x10 3/uL    Lymphocytes Absolute Automated 1.24 0.42 - 3.22 x10 3/uL    Monocytes Absolute Automated 0.61 0.21 - 0.85 x10 3/uL    Eosinophils Absolute Automated 0.11 0.00 - 0.44 x10 3/uL    Basophils Absolute Automated 0.03 0.00 - 0.08 x10 3/uL    Immature Granulocytes Absolute 0.02 0.00 - 0.07 x10 3/uL    Absolute NRBC 0.00 0.00 - 0.00 x10 3/uL   Basic Metabolic Panel   Result Value Ref Range    Glucose 318 (H) 70 - 100 mg/dL    BUN 65.0 (H) 7.0 - 21.0 mg/dL    Creatinine 8.4 (H) 0.4 - 1.0 mg/dL    Calcium 7.5 (L) 8.5 - 10.5 mg/dL    Sodium 138 135 - 145 mEq/L    Potassium 4.4 3.5 - 5.3 mEq/L    Chloride 103 99 - 111 mEq/L    CO2 24 17 - 29 mEq/L    Anion Gap 11.0 5.0 - 15.0   GFR   Result Value Ref Range    EGFR 5.8    Glucose Whole Blood - POCT   Result Value Ref Range    Whole Blood Glucose POCT 434 (H) 70 - 100 mg/dL   Glucose Whole Blood - POCT   Result Value Ref Range    Whole Blood Glucose POCT 358 (H) 70 - 100 mg/dL   Glucose Whole Blood - POCT   Result Value Ref Range    Whole Blood Glucose POCT 371 (H) 70 -  100 mg/dL   Glucose Whole Blood - POCT   Result Value Ref Range    Whole Blood Glucose POCT 263 (H) 70 - 100 mg/dL   Glucose Whole Blood - POCT   Result Value Ref Range    Whole Blood Glucose POCT 357 (H) 70 - 100 mg/dL   Glucose Whole Blood - POCT   Result Value Ref Range    Whole Blood Glucose POCT 300 (H) 70 - 100 mg/dL   Glucose Whole Blood - POCT   Result Value Ref Range    Whole Blood Glucose POCT 206 (H) 70 - 100 mg/dL   Glucose Whole Blood - POCT   Result Value Ref Range    Whole Blood Glucose POCT 281 (H) 70 - 100 mg/dL   Glucose Whole Blood - POCT   Result Value Ref Range    Whole Blood Glucose POCT 178 (H) 70 - 100 mg/dL   Glucose Whole Blood - POCT   Result Value Ref Range    Whole Blood Glucose POCT 288 (H) 70 - 100 mg/dL  Glucose Whole Blood - POCT   Result Value Ref Range    Whole Blood Glucose POCT 283 (H) 70 - 100 mg/dL   ECG 12 lead   Result Value Ref Range    Ventricular Rate 81 BPM    Atrial Rate 81 BPM    P-R Interval 140 ms    QRS Duration 72 ms    Q-T Interval 402 ms    QTC Calculation (Bezet) 466 ms    P Axis 56 degrees    R Axis 60 degrees    T Axis 119 degrees    [facility] MUSE NARRATIVE AND IMPRESSION       NORMAL SINUS RHYTHM  LOW VOLTAGE QRS  NONSPECIFIC T WAVE ABNORMALITY  ABNORMAL ECG  WHEN COMPARED WITH ECG OF 04-Feb-2021 02:08,  PREMATURE SUPRAVENTRICULAR COMPLEXES ARE NO LONGER PRESENT  NONSPECIFIC T WAVE ABNORMALITY, WORSE IN INFERIOR LEADS  NONSPECIFIC T WAVE ABNORMALITY NO LONGER EVIDENT IN ANTERIOR LEADS  Confirmed by Fredia Beets, MD, KINDA ([id number]) on 03/05/2021 8:21:31 AM         CLINICAL DECISION SUPPORT         CRITICAL CARE    CRITICAL CARE: The high probability of sudden, clinically significant deterioration in the patient's condition required the highest level of my preparedness to intervene urgently.    The services I provided to this patient were to treat and/or prevent clinically significant deterioration that could result in: death, permanent disability.  Services included  the following: chart data review, reviewing nursing notes and/or old charts, documentation time, consultant collaboration regarding findings and treatment options, medication orders and management, direct patient care, re-evaluations, vital sign assessments and ordering, interpreting and reviewing diagnostic studies/lab tests.    Aggregate critical care time was 11min, which includes only time during which I was engaged in work directly related to the patient's care, as described above, whether at the bedside or elsewhere in the Emergency Department.  It did not include time spent performing other reported procedures or the services of residents, students, nurses or physician assistants.      3. Core Measures:     SEP-1 documentation    Please select option A (exclude), B (severe sepsis), or C (septic shock) from the drop-down menu below:    A. ------------Severe Sepsis/Septic Shock Exclusion----------------------------    Pt is excluded from severe sepsis or septic shock consideration at 1:47 am [enter time of bed request placement] because of one or more reasons below:    All SIRS, abnl vitals, organ dysfunction are NOT due to severe sepsis or septic shock, but due to alternative cause: Other: esrd, htn [list cause].     The timestamp from the preceding paragraph above also applies to every infectious and/or SEP-1-related diagnosis in Clinical Impression or MDM section of this note, as well as to any mention of the words "sepsis", "infection" or any infection/sepsis-related word or phrase in any part of this note.         ATTESTATIONS      Physician Attestation: Suella Broad Hilarie Sinha, MD PhD , have been the primary provider for [name] during this Emergency Dept visit and have reviewed the chart documented for accuracy and agree with its content.       DIAGNOSIS      Diagnosis:  Final diagnoses:   Hyperkalemia   ESRD (end stage renal disease) on dialysis   SOB (shortness of breath)   Elevated troponin   Non  compliance with medical treatment   Hypertensive  urgency       Disposition:  ED Disposition       ED Disposition   Observation    Condition   --    Date/Time   Fri Mar 04, 2021  1:47 AM    Comment   Admitting Physician: AL Forrest Moron [[id number]]   Service:: Cardiology [[id number]]   Estimated Length of Stay: < 2 midnights   Tentative Discharge Plan?: Home or Self Care [1]   Does patient need telemetry?: Yes   Telemetry type (separate Telemetry order is also required):: Cardiac telemetry                 Prescriptions:  Current Discharge Medication List        CONTINUE these medications which have NOT CHANGED    Details   acetaminophen (TYLENOL) 500 MG tablet Take 500 mg by mouth every 6 (six) hours as needed for Pain      albuterol sulfate HFA (PROVENTIL) 108 (90 Base) MCG/ACT inhaler Inhale 2 puffs into the lungs every 6 (six) hours as needed for Wheezing or Shortness of Breath      amLODIPine (NORVASC) 10 MG tablet Take 1 tablet by mouth daily      aspirin 81 MG chewable tablet Chew 81 mg by mouth daily      atorvastatin (LIPITOR) 40 MG tablet Take 40 mg by mouth daily      carvedilol (COREG) 25 MG tablet Take 1 tablet by mouth 2 (two) times daily with meals      dextromethorphan-guaiFENesin (MUCINEX DM) 30-600 MG per 12 hr tablet Take 1 tablet by mouth every 12 (twelve) hours  Qty: 14 tablet, Refills: 1      gabapentin (NEURONTIN) 600 MG tablet Take 1 capsule by mouth every 8 (eight) hours      insulin aspart (NovoLOG) 100 UNIT/ML injection Inject 15 Units into the skin 3 (three) times daily before meals      Insulin Degludec (TRESIBA SC) Inject 35 Unit into the skin      pantoprazole (PROTONIX) 40 MG tablet Take 40 mg by mouth daily      sevelamer (RENVELA) 800 MG tablet Take 800 mg by mouth 2 (two) times daily with meals                    Sylina Henion, Wynell Balloon, MD PhD  03/05/21 2241       Kateland Leisinger, Wynell Balloon, MD PhD  03/05/21 [phone]

## 2021-03-04 NOTE — H&P (Signed)
[facility])      ADMISSION- HISTORY AND PHYSICAL EXAM      Date Time: 03/04/21 2:06 AM  Patient Name: [name]  Attending Physician: Carmine Savoy, Edwin Dada, MD  Primary Care Physician: Horton Marshall, MD  Location/Room: [phone]     Chief Complaint / Primary reason for ICU evaluation:  Progressive dyspnea    History of Presenting Illness:   [name] is a 66 y.o. female with multiple medical problem including end-stage renal disease, poorly controlled hypertension, diabetes melitis type II and history of nonadherence to medical treatment who was brought by EMS complaining of progressive dyspnea for 1 week.  She also was found to be hypertensive and hyperglycemic.  Patient missed 2 hemodialysis sessions over the last week, reports nonadherence to her medication as well.  At this time no focal weakness or numbness, no chest pain, no hemoptysis no nausea or vomiting.  Patient was found to be hyperkalemic, received medical treatment in form of insulin calcium and Kayexalate.  Nephrology consulted and plan for emergent hemodialysis.    Problem List:   Problem List:   Patient Active Problem List   Diagnosis    Iron deficiency anemia secondary to inadequate dietary iron intake    Sideropenic dysphagia    Peritoneal dialysis catheter dysfunction, initial encounter    Peritonitis    Upper GI bleed    ESRD (end stage renal disease) on dialysis    Generalized abdominal pain    Thrombocytopenia    h/o orthostatic hypotension    Type 2 diabetes mellitus, with long-term current use of insulin    GERD (gastroesophageal reflux disease)    Bowel incontinence    Right sided numbness    Uncontrolled type 2 diabetes mellitus with hyperglycemia    Hypertension    Hyperlipidemia    History of COPD    Gastritis with hemorrhage    History of anemia due to chronic kidney disease    Open wound, abdominal wall, lateral, subsequent encounter    Gout    Dizziness    Fall     History of CVA (cerebrovascular accident)    Pneumonia due to COVID-19 virus    Diarrhea    Hypertensive urgency    Shortness of breath    ESRD (end stage renal disease)    Hyperkalemia    Pulmonary edema    Hypervolemia    Elevated troponin    Chest pain    Nausea vomiting and diarrhea    Breast pain, left    Syncope    Hypertensive emergency       Plan:   Dispo: Admit to the intensive care unit, patient with severe hyperkalemia and hypertensive urgency due to missing hemodialysis as well as acute on chronic hypoxic respiratory failure at high risk for deterioration.  Nephrology has been consulted and plan for emergent hemodialysis    Critical Care services by organ system  NEURO:   Pain control  Patient at risk for delirium, delirium preventive strategy    CV:   -Poorly controlled hypertension/hypertensive urgency  Resume home antihypertensive medication, use hydralazine as needed to maintain systolic blood pressure below 180, till she is dialyzed I will hold on beta-blocker  -Elevated troponin:  No clear ischemic changes on EKG, likely related to demand supply with her hypertension and the patient with end-stage renal disease    PULM:   -Acute on chronic hypoxic respiratory failure:  Reportedly patient on 2 L nasal  cannula at home, now with increased oxygen requirement likely due to volume overload due to missing hemodialysis  Supplemental oxygen to maintain saturation above 90%    GI:   GI PPX: Patient on pantoprazole at home [unclear indication]  Nutrition: N.p.o. for now given risk for deterioration and possibly needing intubation    RENAL:   -End-stage renal disease: Scheduled for hemodialysis Monday Wednesday Friday.  Last received hemodialysis on Friday [missed Monday and Wednesday]  -Hyperkalemia: Received medical treatment in form of insulin, calcium and Kayexalate.  Did not appreciate hyperkalemic changes on EKG  History of nonadherence with recent hospitalization for missing hemodialysis  Nephrology  contacted and plan for emergent hemodialysis, cruciates are intact    ID:   Monitor off antibiotic    HEME:   SCD's  Pharmacologic ppx: Lovenox     ENDO/RHEUM:   -Poorly controlled diabetes mellitus type 2: Start patient on Lantus and insulin sliding scale  Glucose: goal 100 -180    Code Status: Full Code  Patient is currently able  to make medical decisions.      Discussed current diagnoses, prognosis and goals of care.     Past Medical History:     Past Medical History:   Diagnosis Date    Chronic obstructive pulmonary disease     Diabetes mellitus     Hyperlipidemia     Hypertension     Sleep apnea 2018       Past Surgical History:     Past Surgical History:   Procedure Laterality Date    EGD, BIOPSY N/A 05/14/2019    Procedure: EGD, BIOPSY;  Surgeon: Ronie Spies, MD;  Location: [facility];  Service: Gastroenterology;  Laterality: N/A;    EXPLORATORY LAPAROTOMY N/A 05/27/2019    Procedure: EXPLORATORY LAPAROTOMY;  Surgeon: Herbert Moors., MD;  Location: [facility];  Service: General;  Laterality: N/A;    JOINT REPLACEMENT      shoulder    JOINT REPLACEMENT      knee    OVARY SURGERY Left     Removal    REMOVAL, FOREIGN BODY N/A 05/27/2019    Procedure: REMOVAL, PERITONEAL DIALYSIS CATHETER;  Surgeon: Herbert Moors., MD;  Location: [facility];  Service: General;  Laterality: N/A;    TUNNELED CATH CHECK/CHANGE (PERMCATH) N/A 07/03/2019    Procedure: TUNNELED CATH CHECK/CHANGE;  Surgeon: Maureen Ralphs, MD;  Location: [facility];  Service: Interventional Radiology;  Laterality: N/A;    TUNNELED CATH PLACEMENT (PERMCATH) Bilateral 05/23/2019    Procedure: TUNNELED CATH PLACEMENT;  Surgeon: Jacques Earthly, MD;  Location: [facility];  Service: Interventional Radiology;  Laterality: Bilateral;    TUNNELED CATH PLACEMENT ([facility]MCATH) Right 07/31/2019    Procedure: TUNNELED CATH PLACEMENT;  Surgeon: Jacques Earthly, MD;  Location: [facility];  Service: Interventional Radiology;  Laterality:  Right;       Family History:     Family History   Problem Relation Age of Onset    Diabetes Mother     Hypertension Mother     Diabetes Father     Hypertension Father     Diabetes Sister     Hypertension Sister     Diabetes Brother     Hypertension Brother     Diabetes Paternal Aunt     Diabetes Paternal Uncle        Social History:     Social History     Socioeconomic History    Marital status: Herbalist  Separated     Spouse name: Not on file    Number of children: Not on file    Years of education: Not on file    Highest education level: Not on file   Occupational History    Not on file   Tobacco Use    Smoking status: Never    Smokeless tobacco: Never   Vaping Use    Vaping Use: Never used   Substance and Sexual Activity    Alcohol use: Never    Drug use: Never    Sexual activity: Not Currently   Other Topics Concern    Not on file   Social History Narrative    Not on file     Social Determinants of Health     Financial Resource Strain: Not on file   Food Insecurity: Not on file   Transportation Needs: Not on file   Physical Activity: Not on file   Stress: Not on file   Social Connections: Not on file   Intimate Partner Violence: Not on file   Housing Stability: Not on file       Allergies:     Allergies   Allergen Reactions    Lisinopril Hives    Metformin Hives       Medications:   Hospital medications:    Current Facility-Administered Medications   Medication Dose Route Frequency    amLODIPine  10 mg Oral Daily    aspirin  81 mg Oral Daily    atorvastatin  40 mg Oral Daily    enoxaparin  30 mg Subcutaneous Daily    insulin glargine  20 Units Subcutaneous Daily    insulin lispro  1-8 Units Subcutaneous Q4H SCH    pantoprazole  40 mg Oral QAM AC    senna-docusate  1 tablet Oral Q12H [place]        Current Facility-Administered Medications   Medication       Review of Systems:   Due to critical status of the patient at this time, ROS was not obtained.    Physical Exam:   Temp:  [98.2 F (36.8 C)] 98.2 F (36.8  C)  Heart Rate:  [69-83] 69  Resp Rate:  [11-26] 24  BP: (192-198)/(91-146) 198/91    Intake and Output Summary (Last 24 hours) at Date Time  No intake or output data in the 24 hours ending 03/04/21 0206         BP (!) 198/91   Pulse 69   Temp 98.2 F (36.8 C) (Oral)   Resp (!) 24   Wt 112.5 kg (248 lb 1.6 oz)   LMP  (LMP Unknown) Comment: post menopausal  SpO2 98%   BMI 44.80 kg/m      General Appearance: Elderly lady in mild distress  Mental status: Conscious alert oriented x3  Neuro: Face symmetrical, pupil equal reactive, speech is clear, moving 4 limbs with equal power  Lungs: Fine crepitation bilaterally  Cardiac: Normal heart sound no added sound  Abdomen: Soft and lax no hepatosplenomegaly  Extremities: Edema present  Skin:   No rash  Other:  Left HD fistula    Labs:     Results       Procedure Component Value Units Date/Time    Cell MorpHology [[id number]]  (Abnormal) Collected: 03/04/21 0123     Updated: 03/04/21 0203     Cell Morphology Normal     Platelet Estimate Decreased    CBC and differential [[id number]]  (Abnormal) Collected: 03/04/21  0123    Specimen: Blood Updated: 03/04/21 0202     WBC 5.36 x10 3/uL      Hgb 11.6 g/dL      Hematocrit 38.4 %      Platelets 90 x10 3/uL      RBC 4.08 x10 6/uL      MCV 94.1 fL      MCH 28.4 pg      MCHC 30.2 g/dL      RDW 14 %      MPV 13.2 fL      Instrument Absolute Neutrophil Count 3.66 x10 3/uL      Neutrophils 68.2 %      Lymphocytes Automated 18.1 %      Monocytes 11.2 %      Eosinophils Automated 1.5 %      Basophils Automated 0.6 %      Immature Granulocytes 0.4 %      Nucleated RBC 0.0 /100 WBC      Neutrophils Absolute 3.66 x10 3/uL      Lymphocytes Absolute Automated 0.97 x10 3/uL      Monocytes Absolute Automated 0.60 x10 3/uL      Eosinophils Absolute Automated 0.08 x10 3/uL      Basophils Absolute Automated 0.03 x10 3/uL      Immature Granulocytes Absolute 0.02 x10 3/uL      Absolute NRBC 0.00 x10 3/uL     PT/APTT [[id number]]  (Abnormal)  Collected: 03/04/21 0123     Updated: 03/04/21 0150     PT 13.6 sec      PT INR 1.2     PTT 30 sec     COVID-19 (SARS-CoV-2) and Influenza A/B, NAA (Liat Rapid)- Admission [[id number]] Collected: 03/04/21 0056    Specimen: Culturette from Nasopharyngeal Updated: 03/04/21 0141     Purpose of COVID testing Diagnostic -PUI     SARS-CoV-2 Specimen Source Nasal Swab     SARS CoV 2 Overall Result Not Detected     Influenza A Not Detected     Influenza B Not Detected    Narrative:      o Collect and clearly label specimen type:  o PREFERRED-Upper respiratory specimen: One Nasal Swab in  [employer].  o Hand deliver to laboratory ASAP  Diagnostic -PUI    Lactic Acid [[id number]] Collected: 03/04/21 0123    Specimen: Blood Updated: 03/04/21 0135     Lactic Acid 1.4 mmol/L     Narrative:      Cancel second order for specimen if the initial level is less  than 2.78mEq/L    Culture Blood Aerobic and Anaerobic [[id number]] Collected: 03/04/21 0123    Specimen: Arm from Blood, Venipuncture Updated: 03/04/21 0123    Narrative:      1 BLUE+1 PURPLE    Culture Blood Aerobic and Anaerobic [[id number]] Collected: 03/04/21 0123    Specimen: Arm from Blood, Venipuncture Updated: 03/04/21 0123    Narrative:      1 BLUE+1 PURPLE    High Sensitivity Troponin-I at 2 hrs with calculated Delta [[id number]] Collected: 03/04/21 0123    Specimen: Blood Updated: 03/04/21 0123    High Sensitivity Troponin-I at 0 hrs [[id number]]  (Abnormal) Collected: 03/04/21 0007    Specimen: Blood Updated: 03/04/21 0050     hs Troponin-I 102.0 ng/L     Arterial Blood Gas (ABG) [[id number]]  (Abnormal) Collected: 03/04/21 0030    Specimen: Blood, Arterial Updated: 03/04/21 0045     pH, Arterial 7.307  pCO2, Arterial 38.1 mmhg      pO2, Arterial 93.7 mmhg      HCO3, Arterial 18.5 mEq/L      Arterial Total CO2 38.6 mEq/L      Base Excess, Arterial -6.8 mEq/L      O2 Sat, Arterial 96.2 %      ABG CollectionSite Right Radl     Allen's Test Yes     Temperature 37.0      FIO2 28 %      Status Oxygen     O2 Delivery Nasal Cannula     O2 Flow 2.0 L/min     Lactic Acid [[id number]] Collected: 03/04/21 0007    Specimen: Blood Updated: 03/04/21 0042     Lactic Acid 1.3 mmol/L     Comprehensive metabolic panel [[id number]]  (Abnormal) Collected: 03/04/21 0007    Specimen: Blood Updated: 03/04/21 0040     Glucose 551 mg/dL      BUN 99.0 mg/dL      Creatinine 11.4 mg/dL      Sodium 135 mEq/L      Potassium 6.9 mEq/L      Chloride 102 mEq/L      CO2 18 mEq/L      Calcium 7.7 mg/dL      Protein, Total 6.1 g/dL      Albumin 2.8 g/dL      AST (SGOT) 14 U/L      ALT 20 U/L      Alkaline Phosphatase 203 U/L      Bilirubin, Total 0.4 mg/dL      Globulin 3.3 g/dL      Albumin/Globulin Ratio 0.8     Anion Gap 15.0    GFR [[id number]] Collected: 03/04/21 0007     Updated: 03/04/21 0040     EGFR 4.0    Glucose Whole Blood - POCT [[id number]]  (Abnormal) Collected: 03/03/21 2343     Updated: 03/03/21 2346     Whole Blood Glucose POCT 434 mg/dL             Microbiology Results (last 15 days)       Procedure Component Value Units Date/Time    Culture Blood Aerobic and Anaerobic [[id number]] Collected: 03/04/21 0123    Order Status: Sent Specimen: Arm from Blood, Venipuncture Updated: 03/04/21 0123    Narrative:      1 BLUE+1 PURPLE    Culture Blood Aerobic and Anaerobic [[id number]] Collected: 03/04/21 0123    Order Status: Sent Specimen: Arm from Blood, Venipuncture Updated: 03/04/21 0123    Narrative:      1 BLUE+1 PURPLE    COVID-19 (SARS-CoV-2) and Influenza A/B, NAA (Liat Rapid)- Admission [[id number]] Collected: 03/04/21 0056    Order Status: Completed Specimen: Culturette from Nasopharyngeal Updated: 03/04/21 0141     Purpose of COVID testing Diagnostic -PUI     SARS-CoV-2 Specimen Source Nasal Swab     SARS CoV 2 Overall Result Not Detected     Comment: __________________________________________________  -A result of "Detected" indicates POSITIVE for the    presence of SARS CoV-2 RNA  -A result of "Not  Detected" indicates NEGATIVE for the    presence of SARS CoV-2 RNA  __________________________________________________________  Test performed using the Roche cobas Liat SARS-CoV-2 assay. This assay is  only for use under the Food and Drug Administration's Emergency Use  Authorization. This is a real-time RT-PCR assay for the qualitative  detection of SARS-CoV-2 RNA. Viral nucleic acids may persist in vivo,  independent of  viability. Detection of viral nucleic acid does not imply the  presence of infectious virus, or that virus nucleic acid is the cause of  clinical symptoms. Negative results do not preclude SARS-CoV-2 infection and  should not be used as the sole basis for diagnosis, treatment or other  patient management decisions. Negative results must be combined with  clinical observations, patient history, and/or epidemiological information.  Invalid results may be due to inhibiting substances in the specimen and  recollection should occur. Please see Fact Sheets for patients and providers  located:  https://www.benson-chung.com/          Influenza A Not Detected     Influenza B Not Detected     Comment: Test performed using the Roche cobas Liat SARS-CoV-2 & Influenza A/B assay.  This assay is only for use under the Food and Drug Administration's  Emergency Use Authorization. This is a multiplex real-time RT-PCR assay  intended for the simultaneous in vitro qualitative detection and  differentiation of SARS-CoV-2, influenza A, and influenza B virus RNA. Viral  nucleic acids may persist in vivo, independent of viability. Detection of  viral nucleic acid does not imply the presence of infectious virus, or that  virus nucleic acid is the cause of clinical symptoms. Negative results do  not preclude SARS-CoV-2, influenza A, and/or influenza B infection and  should not be used as the sole basis for diagnosis, treatment or other  patient management decisions. Negative results must be combined  with  clinical observations, patient history, and/or epidemiological information.  Invalid results may be due to inhibiting substances in the specimen and  recollection should occur. Please see Fact Sheets for patients and providers  located: http://olson-hall.info/.         Narrative:      o Collect and clearly label specimen type:  o PREFERRED-Upper respiratory specimen: One Nasal Swab in  [employer].  o Hand deliver to laboratory ASAP  Diagnostic -PUI            Radiology / Imaging:   I reviewed chest x-ray by myself and agree with report as below  Chest AP Portable   Final Result      Suspect mild recurrent pulmonary edema.      Keith Rake, MD    03/04/2021 1:36 AM          I have personally reviewed the patient's history and 24 hour interval events, along with vitals, labs, radiology images, and ventilator settings and additional findings found in detail within ICU team notes from house staff, NPPs and nursing, with their care plans developed and reviewed by me. So far today, I have spent 50 minutes providing critical care for this patient excluding teaching and billable procedures, not overlapping with over providers.     Signed by: Corliss Parish, MD  03/04/2021 2:06 AM  BH:[facility], Cathlean Marseilles, MD  For admissions 7a-7p: [place] attending: [phone]  [facility] attending: [phone]    This note was generated by the Epic EMR system/ Dragon speech recognition and may contain inherent errors or omissions not intended by the user. Grammatical errors, random word insertions, deletions, pronoun errors and incomplete sentences are occasional consequences of this technology due to software limitations. Not all errors are caught or corrected. If there are questions or concerns about the content of this note or information contained within the body of this dictation they should be addressed directly with the author for clarification

## 2021-03-04 NOTE — Plan of Care (Signed)
Problem: Renal Instability  Goal: Fluid and electrolyte balance are achieved/maintained  Outcome: Progressing  Flowsheets (Taken 03/04/2021 0500)  Fluid and electrolyte balance are achieved/maintained:   Monitor/assess lab values and report abnormal values   Monitor daily weight   Assess for confusion/personality changes   Observe for cardiac arrhythmias     Problem: Patient Receiving Advanced Renal Therapies  Goal: Therapy access site remains intact  Outcome: Progressing  Flowsheets (Taken 03/04/2021 0500)  Therapy access site remains intact: Assess therapy access site

## 2021-03-04 NOTE — Plan of Care (Signed)
Brief Nephrology Note:    66 y/o woman PMH ESRD (MWF), last HD Friday who presented with SOB, HTN, and hyperkalemia to 6.9. Will activate on-call for emergent HD tonight. Full note to follow.

## 2021-03-05 LAB — ECG 12-LEAD
Atrial Rate: 81 {beats}/min
IHS MUSE NARRATIVE AND IMPRESSION: NORMAL
P Axis: 56 degrees
P-R Interval: 140 ms
Q-T Interval: 402 ms
QRS Duration: 72 ms
QTC Calculation (Bezet): 466 ms
R Axis: 60 degrees
T Axis: 119 degrees
Ventricular Rate: 81 {beats}/min

## 2021-03-05 LAB — BASIC METABOLIC PANEL
Anion Gap: 11 (ref 5.0–15.0)
BUN: 65 mg/dL — ABNORMAL HIGH (ref 7.0–21.0)
CO2: 24 mEq/L (ref 17–29)
Calcium: 7.5 mg/dL — ABNORMAL LOW (ref 8.5–10.5)
Chloride: 103 mEq/L (ref 99–111)
Creatinine: 8.4 mg/dL — ABNORMAL HIGH (ref 0.4–1.0)
Glucose: 318 mg/dL — ABNORMAL HIGH (ref 70–100)
Potassium: 4.4 mEq/L (ref 3.5–5.3)
Sodium: 138 mEq/L (ref 135–145)

## 2021-03-05 LAB — CBC AND DIFFERENTIAL
Absolute NRBC: 0 10*3/uL (ref 0.00–0.00)
Basophils Absolute Automated: 0.03 10*3/uL (ref 0.00–0.08)
Basophils Automated: 0.6 %
Eosinophils Absolute Automated: 0.11 10*3/uL (ref 0.00–0.44)
Eosinophils Automated: 2.2 %
Hematocrit: 34.9 % (ref 34.7–43.7)
Hgb: 10.9 g/dL — ABNORMAL LOW (ref 11.4–14.8)
Immature Granulocytes Absolute: 0.02 10*3/uL (ref 0.00–0.07)
Immature Granulocytes: 0.4 %
Instrument Absolute Neutrophil Count: 3.04 10*3/uL (ref 1.10–6.33)
Lymphocytes Absolute Automated: 1.24 10*3/uL (ref 0.42–3.22)
Lymphocytes Automated: 24.6 %
MCH: 29.1 pg (ref 25.1–33.5)
MCHC: 31.2 g/dL — ABNORMAL LOW (ref 31.5–35.8)
MCV: 93.3 fL (ref 78.0–96.0)
MPV: 12.5 fL (ref 8.9–12.5)
Monocytes Absolute Automated: 0.61 10*3/uL (ref 0.21–0.85)
Monocytes: 12.1 %
Neutrophils Absolute: 3.04 10*3/uL (ref 1.10–6.33)
Neutrophils: 60.1 %
Nucleated RBC: 0 /100 WBC (ref 0.0–0.0)
Platelets: 96 10*3/uL — ABNORMAL LOW (ref 142–346)
RBC: 3.74 10*6/uL — ABNORMAL LOW (ref 3.90–5.10)
RDW: 15 % (ref 11–15)
WBC: 5.05 10*3/uL (ref 3.10–9.50)

## 2021-03-05 LAB — GLUCOSE WHOLE BLOOD - POCT
Whole Blood Glucose POCT: 281 mg/dL — ABNORMAL HIGH (ref 70–100)
Whole Blood Glucose POCT: 178 mg/dL — ABNORMAL HIGH (ref 70–100)
Whole Blood Glucose POCT: 288 mg/dL — ABNORMAL HIGH (ref 70–100)
Whole Blood Glucose POCT: 283 mg/dL — ABNORMAL HIGH (ref 70–100)

## 2021-03-05 LAB — GFR: EGFR: 5.8

## 2021-03-05 MED ORDER — GLUCOSE 40 % PO GEL (WRAP)
15.0000 g | ORAL | Status: DC | PRN
Start: 2021-03-05 — End: 2021-03-06

## 2021-03-05 MED ORDER — INSULIN LISPRO 100 UNIT/ML SOLN (WRAP)
1.0000 [IU] | Freq: Every evening | Status: DC
Start: 2021-03-05 — End: 2021-03-06
  Administered 2021-03-05: 3 [IU] via SUBCUTANEOUS
  Filled 2021-03-05: qty 9

## 2021-03-05 MED ORDER — SODIUM CHLORIDE 0.9 % IV BOLUS
250.0000 mL | INTRAVENOUS | Status: AC | PRN
Start: 2021-03-05 — End: 2021-03-05

## 2021-03-05 MED ORDER — GLUCAGON 1 MG IJ SOLR (WRAP)
1.0000 mg | INTRAMUSCULAR | Status: DC | PRN
Start: 2021-03-05 — End: 2021-03-06

## 2021-03-05 MED ORDER — INSULIN LISPRO 100 UNIT/ML SOLN (WRAP)
1.0000 [IU] | Freq: Three times a day (TID) | Status: DC
Start: 2021-03-05 — End: 2021-03-06
  Administered 2021-03-05 – 2021-03-06 (×3): 5 [IU] via SUBCUTANEOUS
  Filled 2021-03-05 (×3): qty 15

## 2021-03-05 MED ORDER — DEXTROSE 10 % IV BOLUS
12.5000 g | INTRAVENOUS | Status: DC | PRN
Start: 2021-03-05 — End: 2021-03-06

## 2021-03-05 MED ORDER — DEXTROSE 50 % IV SOLN
12.5000 g | INTRAVENOUS | Status: DC | PRN
Start: 2021-03-05 — End: 2021-03-06

## 2021-03-05 MED ORDER — GABAPENTIN 100 MG PO CAPS
100.0000 mg | ORAL_CAPSULE | Freq: Three times a day (TID) | ORAL | Status: DC
Start: 2021-03-05 — End: 2021-03-06
  Administered 2021-03-05 – 2021-03-06 (×4): 100 mg via ORAL
  Filled 2021-03-05 (×4): qty 1

## 2021-03-05 MED ORDER — SODIUM CHLORIDE 0.9 % IV BOLUS
100.0000 mL | INTRAVENOUS | Status: AC | PRN
Start: 2021-03-05 — End: 2021-03-05

## 2021-03-05 NOTE — UM Notes (Signed)
CONCURRENT REVIEW March 05, 2021    PATIENT NAME: [name],[name]  DOB: 29-Oct-1955    History of present illness: Pt is a 66 y.o. female arrived to hospital on 03/03/2021 at 2336.    BP 111/66   Pulse 64   Temp 97.9 F (36.6 C) (Oral)   Resp 14   Ht 1.549 m (5\' 1" )   Wt 107.4 kg (236 lb 12.4 oz)   LMP  (LMP Unknown) Comment: post menopausal  SpO2 95%   BMI 44.74 kg/m     Temp:  [97.3 F (36.3 C)-98.4 F (36.9 C)]   Heart Rate:  [64-88]   Resp Rate:  [14-42]   BP: (111-174)/(55-79)   SpO2:  [93 %-98 %]   Weight:  [107.4 kg (236 lb 12.4 oz)]     Last recorded pain score:  Pain Scale Used: Numeric Scale (0-10)  Pain Score: 0-No pain       Abnormal Labs:   Lab Results last 48 Hours       Procedure Component Value Units Date/Time    Glucose Whole Blood - POCT [[id number]]  (Abnormal) Collected: 03/05/21 2114     Updated: 03/05/21 2118     Whole Blood Glucose POCT 283 mg/dL     Glucose Whole Blood - POCT [[id number]]  (Abnormal) Collected: 03/05/21 1555     Updated: 03/05/21 1558     Whole Blood Glucose POCT 288 mg/dL     Glucose Whole Blood - POCT [[id number]]  (Abnormal) Collected: 03/05/21 1200     Updated: 03/05/21 1228     Whole Blood Glucose POCT 178 mg/dL     Basic Metabolic Panel [[id number]]  (Abnormal) Collected: 03/05/21 0803    Specimen: Blood Updated: 03/05/21 0830     Glucose 318 mg/dL      BUN 65.0 mg/dL      Creatinine 8.4 mg/dL      Calcium 7.5 mg/dL     GFR [[id number]] Collected: 03/05/21 0803     Updated: 03/05/21 0830     EGFR 5.8    CBC and differential [[id number]]  (Abnormal) Collected: 03/05/21 0803     Hgb 10.9 g/dL      Platelets 96 x10 3/uL      RBC 3.74 x10 6/uL      MCHC 31.2 g/dL     Glucose Whole Blood - POCT [[id number]]  (Abnormal) Collected: 03/05/21 0735     Updated: 03/05/21 0808     Whole Blood Glucose POCT 281 mg/dL     Glucose Whole Blood - POCT [[id number]]  (Abnormal) Collected: 03/04/21 2200     Updated: 03/04/21 2218     Whole Blood Glucose POCT 206 mg/dL     Glucose Whole  Blood - POCT [[id number]]  (Abnormal) Collected: 03/04/21 1538     Updated: 03/04/21 1551     Whole Blood Glucose POCT 300 mg/dL     Basic Metabolic Panel [[id number]]  (Abnormal) Collected: 03/04/21 1413    Specimen: Blood Updated: 03/04/21 1507     Glucose 343 mg/dL      BUN 55.0 mg/dL      Creatinine 7.6 mg/dL      Calcium 7.8 mg/dL     GFR [[id number]] Collected: 03/04/21 1413     Updated: 03/04/21 1507     EGFR 6.5    Glucose Whole Blood - POCT [[id number]]  (Abnormal) Collected: 03/04/21 1142     Updated: 03/04/21 1152     Whole Blood Glucose POCT 357 mg/dL  Glucose Whole Blood - POCT [[id number]]  (Abnormal) Collected: 03/04/21 0749     Updated: 03/04/21 0804     Whole Blood Glucose POCT 263 mg/dL     Glucose Whole Blood - POCT [[id number]]  (Abnormal) Collected: 03/04/21 0430     Updated: 03/04/21 0433     Whole Blood Glucose POCT 371 mg/dL     Basic Metabolic Panel [[id number]]  (Abnormal) Collected: 03/04/21 0300    Specimen: Blood Updated: 03/04/21 0333     Glucose 517 mg/dL      BUN 99.0 mg/dL      Creatinine 11.4 mg/dL      Calcium 7.9 mg/dL      Sodium 136 mEq/Young      Potassium 6.2 mEq/Young      Chloride 100 mEq/Young      CO2 19 mEq/Young      Anion Gap 17.0    GFR [[id number]] Collected: 03/04/21 0300     Updated: 03/04/21 0333     EGFR 4.0    CBC and differential [[id number]]  (Abnormal) Collected: 03/04/21 0300     Hgb 11.3 g/dL      Platelets 92 x10 3/uL      MCHC 30.3 g/dL     Glucose Whole Blood - POCT [[id number]]  (Abnormal) Collected: 03/04/21 0255     Updated: 03/04/21 0257     Whole Blood Glucose POCT 358 mg/dL     Cell MorpHology [[id number]]  (Abnormal) Collected: 03/04/21 0123     Platelet Estimate Decreased    CBC and differential [[id number]]  (Abnormal) Collected: 03/04/21 0123     Platelets 90 x10 3/uL      MCHC 30.2 g/dL      MPV 13.2 fL     PT/APTT [[id number]]  (Abnormal) Collected: 03/04/21 0123     Updated: 03/04/21 0150     PT 13.6 sec      PT INR 1.2     PTT 30 sec     COVID-19 (SARS-CoV-2) and  Influenza A/B, NAA ([facility])- Admission [[id number]] Collected: 03/04/21 0056    Specimen: Culturette from Nasopharyngeal Updated: 03/04/21 0141     Purpose of COVID testing Diagnostic -PUI     SARS-CoV-2 Specimen Source Nasal Swab     SARS CoV 2 Overall Result Not Detected     Influenza A Not Detected     Influenza B Not Detected    Specimen: Blood Updated: 03/04/21 0123    High Sensitivity Troponin-I at 0 hrs [[id number]]  (Abnormal) Collected: 03/04/21 0007    Specimen: Blood Updated: 03/04/21 0050     hs Troponin-I 102.0 ng/Young     Arterial Blood Gas (ABG) [[id number]]  (Abnormal) Collected: 03/04/21 0030    Specimen: Blood, Arterial Updated: 03/04/21 0045     pH, Arterial 7.307     pCO2, Arterial 38.1 mmhg      pO2, Arterial 93.7 mmhg      HCO3, Arterial 18.5 mEq/Young      Arterial Total CO2 38.6 mEq/Young      Base Excess, Arterial -6.8 mEq/Young      O2 Sat, Arterial 96.2 %      ABG CollectionSite Right Radl     Allen's Test Yes     Temperature 37.0     FIO2 28 %      Status Oxygen     O2 Delivery Nasal Cannula     O2 Flow 2.0 Young/min     Lactic Acid [[id number]] Collected: 03/04/21 0007  Specimen: Blood Updated: 03/04/21 0042     Lactic Acid 1.3 mmol/Young     Comprehensive metabolic panel [[id number]]  (Abnormal) Collected: 03/04/21 0007    Specimen: Blood Updated: 03/04/21 0040     Glucose 551 mg/dL      BUN 99.0 mg/dL      Creatinine 11.4 mg/dL      Sodium 135 mEq/Young      Potassium 6.9 mEq/Young      Chloride 102 mEq/Young      CO2 18 mEq/Young      Calcium 7.7 mg/dL      Albumin 2.8 g/dL      Alkaline Phosphatase 203 U/Young      Albumin/Globulin Ratio 0.8     Diagnostics:  No results found.    MD Notes:  MD 03/05/2021  Assessment:  ? Fluid overloading, dyspnea improving.  ? Hyperkalemia  ? End-stage renal disease-was noncompliant.  ? Diabetes type 2  ? Diabetic neuropathy  ? Bilateral toes with fungal infection.  ? Hypertension  ? Hyperlipidemia  ? COPD  ? Obstructive sleep apnea  ? Noncompliance with medical treatment and  follow-up  ? Deconditioning  Plan:  ? Dialysis in progress, nephrology following.  ? Hyperkalemia corrected  ? Advised to comply with dialysis.  ? Blood sugar elevated continue Lantus, change sliding scale coverage to medium algorithm  ? Start gabapentin teen  ? Podiatry consultation requested.  ? Blood pressure controlled continue amlodipine, monitor blood pressure closely.  ? On Lipitor, aspirin.  ? Continue inhaler  ? Outpatient sleep study  ? Symptomatic treatment  ? DVT prophylaxis  ? Discharge planning when stable    Nephrology 03/05/2021  Assessment:  1. ESRD on HD MWF at [facility] - followed by Dr. Randel Pigg  - Missed HD on 2/6, 2/8 due to not feeling well   - Last HD 1 week ago   2. Accelerated hypertension due to volume overload in the setting of missed HD  3. Volume overload - UF as needed  4. Anemia in CKD - Hgb on goal   5. Hyperphosphatemia - monitor , on sevelamer   6. Secondary hyperparathyroidism - monitor   Recommendations:  - HD today   - Stable from renal stand point.        Medications: Scheduled Meds:  Current Facility-Administered Medications   Medication Dose Route Frequency    amLODIPine  10 mg Oral Daily    aspirin  81 mg Oral Daily    atorvastatin  40 mg Oral Daily    calcium GLUConate  1 g Intravenous Once    carvedilol  12.5 mg Oral Q12H SCH    gabapentin  100 mg Oral Q8H SCH    heparin (porcine)  5,000 Units Subcutaneous Q8H [place]    insulin glargine  20 Units Subcutaneous Daily    insulin lispro  1-4 Units Subcutaneous QHS    insulin lispro  1-8 Units Subcutaneous TID AC    pantoprazole  40 mg Oral QAM AC    petrolatum   Topical Q12H    senna-docusate  1 tablet Oral Q12H SCH    sodium bicarbonate  50 mEq Intravenous Once     Continuous Infusions:  PRN Meds:.acetaminophen, Nursing communication: Adult Hypoglycemia Treatment Algorithm **AND** dextrose **AND** dextrose **AND** dextrose **AND** glucagon (rDNA), Nursing communication: Adult Hypoglycemia Treatment Algorithm **AND** dextrose  **AND** dextrose **AND** dextrose **AND** glucagon (rDNA), hydrALAZINE, oxyCODONE      This clinical review is based on compiled documentation provided by  the treatment team within the patient's medical record.      UTILIZATION REVIEW CONTACT :   Regis Bill, MSN, RN, ACM  Utilization Review Case Manager ll   Phone: [phone] (vm only)  Main Line: [phone]  Fax Line [phone]  Cell [phone] (8a-5p)  Jaiveer [email or web address]   ([place]) [address]   ([place]) [facility]   [facility]) [facility]   [employer]) [facility]  [facility])   [place].  [name], [place] [address] [address]  [place], [place] [address] [address]  [place], [place] [address] [phone] [facility].  [place], [place] [address] [place]  [place], [place] [address]   NPI: [id number] NPI: [id number] NPI: [id number] NPI: [id number] NPI: [id number]

## 2021-03-05 NOTE — Progress Notes (Cosign Needed)
FOUR EYES SKIN ASSESSMENT NOTE    [name]  February 14, 1955  [id number]    Braden Scale Score: 75    POC Initiated for Risk for Altered Skin Yes    Patient Assessed for Correct Mattress Surface Yes  *At risk patients with Braden Score less than 12 must be considered for specialty bed    Mepilex or Adhesive Foam Dressing applied to sacrum/heel if any PI risk factors present: Yes      If Wound/Pressure Injury present:  Wound/PI assessment documented in LDA (will be shown below): Yes  Admitting physician notified: Yes  Wound consult ordered: Yes      Was skin checked with incoming RN/PCT? Yes  ICU ONLY: Was HAPI Intervention list reviewed with incoming RN/PCT? Yes    Emmaline Kluver, RN  March 05, 2021  8:06 AM    Second RN/PCT Name:        Wound 03/04/21 Arterial/Ischemic Toe (Comment  which one) Anterior;Right Discolored, black (Active)   Site Description Black 03/05/21 0400   Peri-wound Description Intact 03/05/21 0400   Dressing Other (Comment) 03/04/21 1600   Dressing Status Other (Comment) 03/04/21 1600       Wound 03/04/21 Arterial/Ischemic Toe (Comment  which one) Anterior;Left Discolored (Active)   Site Description Black 03/05/21 0400   Peri-wound Description Intact 03/05/21 0400   Drainage Amount None 03/04/21 1600   Treatments Cleansed;Site care 03/04/21 1200

## 2021-03-05 NOTE — Progress Notes (Signed)
NURSING SHIFT NOTE     Patient: [name]  Day: 0      SHIFT EVENTS     Shift Narrative/Significant Events (PRN med administration, fall, RRT, etc.):   HD today 3.5 fluid removed. Pt tolerated well. Bp remained stable. Pt complaints of numbness and pain on Both feet MD made aware, started on gabapentin. Transferred to unit 23 RM 2325B with all belongings, accompanied by RN Semir. Stable no distress noted. Report given to [facility], [place].    Safety and fall precautions remain in place. Purposeful rounding completed.          ASSESSMENT     Changes in assessment from patient's baseline this shift:    Neuro: No  CV: No NSR, Palpable pulses all throughout.  Pulm: No  Peripheral Vascular: No  HEENT: No  GI: Yes 2 soft/formed BM this shift. Recent BG 178.   BM during shift: YES  Last BM: Last BM Date: 03/04/21  GU: No  no urine  Integ: No  MS: No    Pain: Improved  Pain Interventions: Rest and Positioning  Medications Utilized: NONE     Mobility: PMP Activity: Step 3 - Bed Mobility of Distance Walked (ft) (Step 6,7): 0 Feet           Lines     Patient Lines/Drains/Airways Status       Active Lines, Drains and Airways       Name Placement date Placement time Site Days    Peripheral IV 03/04/21 20 G Anterior;Distal;Right Forearm 03/04/21  --  Forearm  1    External Urinary Catheter 11/12/20  1330  --  113    Graft/Fistula Left --  --  -- --                         VITAL SIGNS     Vitals:    03/05/21 1248   BP: 154/69   Pulse: 68   Resp: 18   Temp: 97.6 F (36.4 C)   SpO2: 94%       Temp  Min: 97.3 F (36.3 C)  Max: 98.4 F (36.9 C)  Pulse  Min: 66  Max: 88  Resp  Min: 14  Max: 42  BP  Min: 114/55  Max: 174/79  SpO2  Min: 93 %  Max: 96 %      Intake/Output Summary (Last 24 hours) at 03/05/2021 1502  Last data filed at 03/05/2021 1248  Gross per 24 hour   Intake 600 ml   Output 3890 ml   Net -3290 ml            CARE PLAN

## 2021-03-05 NOTE — Plan of Care (Signed)
Problem: Renal Instability  Goal: Fluid and electrolyte balance are achieved/maintained  Outcome: Progressing     Problem: Patient Receiving Advanced Renal Therapies  Goal: Therapy access site remains intact  Outcome: Progressing     Problem: Fluid and Electrolyte Imbalance/ Endocrine  Goal: Fluid and electrolyte balance are achieved/maintained  Outcome: Progressing  Goal: Adequate hydration  Outcome: Progressing     RAG positive for bruit and thrill.No signs of infection noted.

## 2021-03-05 NOTE — Progress Notes (Signed)
Pt tolerated HD treatment well.Net UF 3.5 liters,vital signs stable.No complaint made at this time.Stable.Report given to ICU RN.   03/05/21 1248   Treatment Summary   Time Off Machine 1230   Duration of Treatment (Hours) 3.5   Treatment Type 1:1   Dialyzer Clearance Lightly streaked   Fluid Volume Off (mL) 3900   Prime Volume (mL) 200   Rinseback Volume (mL) 200   Fluid Given: Normal Saline (mL) 0   Fluid Given: PRBC  0 mL   Fluid Given: Albumin (mL) 0   Fluid Given: Other (mL) 0   Total Fluid Given 400   Hemodialysis Net Fluid Removed 3500   Post Treatment Assessment   Post-Treatment Weight (Kg) 103.8   Patient Response to Treatment Tolerated treatment well   Graft/Fistula Left   No placement date or time found.   Access Type: Arteriovenous Fistula  Orientation: Left  Graft/Fistula Location: Upper Arm   Fistula/ Graft Assessment Abnormalities WDL   Needle Size 16 Gauge   Cannulation Sites held Arterial (min) 7   Cannulation Sites held Venous (min) 7   Hemostasis Achieved Yes   Vitals   Temp 97.6 F (36.4 C)   Heart Rate 68   Resp Rate 18   BP 154/69   SpO2 94 %   O2 Device None (Room air)   Assessment   Mental Status Alert;Oriented   Cardiac (WDL) WDL   Cardiac Regularity Regular   Cardiac Rhythm Normal Sinus Rhythm   Respiratory Pattern Regular   Bilateral Breath Sounds Clear;Diminished   Cough Spontaneous   RLE Edema +1   LLE Edema +1   General Skin Color Appropriate for ethnicity   Skin Condition/Temp Warm;Dry   Gastrointestinal (WDL) WDL   Abdomen Inspection Soft;Nondistended   GI Symptoms None   Pain Assessment   Charting Type Assessment   Pain Scale Used Numeric Scale (0-10)   Numeric Pain Scale   Pain Score 0   POSS Score 1   Education   Person taught Patient   Knowledge basis Substantial   Topics taught Access care;S&S of infection;K+;Fluid Management;Tx Options   Teaching Tools Explain   [name] Understanding   Bedside Nurse Communication   Name of bedside RN - post dialysis Percell Boston

## 2021-03-05 NOTE — Plan of Care (Signed)
Problem: Moderate/High Fall Risk Score >5  Goal: Patient will remain free of falls  03/05/2021 0800 by Bjorn Pippin, Lysbeth Penner, RN  Outcome: Progressing  Flowsheets (Taken 03/05/2021 0400)  Moderate Risk (6-13):   MOD-Consider activation of bed alarm if appropriate   MOD-Apply bed exit alarm if patient is confused   MOD-Floor mat at bedside (where available) if appropriate   MOD-Remain with patient during toileting   MOD-Consider a move closer to [place]   MOD-Place bedside commode and assistive devices out of sight when not in use   MOD-Re-orient confused patients  03/05/2021 0526 by Bjorn Pippin, Lysbeth Penner, RN  Outcome: Progressing     Problem: Compromised Tissue integrity  Goal: Damaged tissue is healing and protected  03/05/2021 0800 by Bjorn Pippin, Lysbeth Penner, RN  Outcome: Progressing  Flowsheets (Taken 03/05/2021 0800)  Damaged tissue is healing and protected:   Keep intact skin clean and dry   Avoid shearing injuries   Relieve pressure to bony prominences for patients at moderate and high risk   Increase activity as tolerated/progressive mobility   Reposition patient every 2 hours and as needed unless able to reposition self   Provide wound care per wound care algorithm   Monitor/assess Braden scale every shift   Use bath wipes, not soap and water, for daily bathing   Monitor patient's hygiene practices   Utilize specialty bed  03/05/2021 0526 by Bjorn Pippin, Lysbeth Penner, RN  Outcome: Progressing  Goal: Nutritional status is improving  03/05/2021 0800 by Bjorn Pippin, Lysbeth Penner, RN  Outcome: Progressing  Flowsheets (Taken 03/05/2021 0800)  Nutritional status is improving:   Assist patient with eating   Allow adequate time for meals   Encourage patient to take dietary supplement(s) as ordered   Collaborate with Clinical Nutritionist   Include patient/patient care companion in decisions related to nutrition  03/05/2021 0526 by Bjorn Pippin, Lysbeth Penner, RN  Outcome: Progressing     Problem: Renal  Instability  Goal: Fluid and electrolyte balance are achieved/maintained  03/05/2021 0800 by Bjorn Pippin, Lysbeth Penner, RN  Outcome: Progressing  Flowsheets (Taken 03/04/2021 0517 by Venia Minks, RN)  Fluid and electrolyte balance are achieved/maintained:   Monitor intake and output every shift   Monitor/assess lab values and report abnormal values   Provide adequate hydration   Monitor daily weight   Assess for confusion/personality changes   Assess and reassess fluid and electrolyte status   Observe for seizure activity and initiate seizure precautions if indicated   Observe for cardiac arrhythmias   Monitor for muscle weakness   Follow fluid restrictions/IV/PO parameters  03/05/2021 0526 by Bjorn Pippin, Lysbeth Penner, RN  Outcome: Progressing     Problem: Patient Receiving Advanced Renal Therapies  Goal: Therapy access site remains intact  Outcome: Progressing  Flowsheets (Taken 03/04/2021 0500 by Lendell Caprice, RN)  Therapy access site remains intact: Assess therapy access site     Problem: Fluid and Electrolyte Imbalance/ Endocrine  Goal: Fluid and electrolyte balance are achieved/maintained  Outcome: Progressing  Flowsheets (Taken 03/05/2021 0800)  Fluid and electrolyte balance are achieved/maintained:   Monitor intake and output every shift   Monitor/assess lab values and report abnormal values   Provide adequate hydration   Assess for confusion/personality changes   Monitor daily weight   Assess and reassess fluid and electrolyte status   Observe for seizure activity and initiate seizure precautions if indicated   Observe for cardiac arrhythmias   Oriented and alert. Pupils equal and brisk    In sinus rhythm. Pulses  palpable    On room air, chest clear    On consistent carb and renal diet.  On glucose monitoring. Treated as per sliding scale for reading of 206.    Voided spontaneously. Output 128mls    Blisters to both great toes. Wound care consulted.

## 2021-03-05 NOTE — Nursing Progress Note (Addendum)
Pt received via bed from ICU, oriented to room, call bell and phone within easy reach. Pt aware that she is in isolation, does not remember why or when or where she has had MRSA at.  Tele box placed, ext cath placed., blood sugar done and coverage given as ordered.Marland Kitchen Optimal safety and comfort to be maintained.  Four eyes done with Ronnie Derby  Noted bruises scattered upper ext./scratches lower extremities  And discoloration of bilateral big toes/dry heels.

## 2021-03-05 NOTE — Progress Notes (Signed)
[facility] PROGRESS NOTE  [id number]      Date Time: 03/05/21 7:26 AM  Patient Name: [name]  Attending Physician: Barbaraann Faster, MD    CC: follow-up ESRD     Assessment:     ESRD on HD MWF at [facility] - followed by Dr. Randel Pigg  - Missed HD on 2/6, 2/8 due to not feeling well   - Last HD 1 week ago   Accelerated hypertension due to volume overload in the setting of missed HD  Volume overload - UF as needed  Anemia in CKD - Hgb on goal   Hyperphosphatemia - monitor , on sevelamer   Secondary hyperparathyroidism - monitor     Recommendations:     - HD today   - Stable from renal stand point.       Case discussed with: pt      Gaylyn Lambert, MD  [facility]  [id number] (office)      Subjective:     On RA    Review of Systems:   No cp  No sob      Physical Exam:     Vitals:    03/04/21 2000 03/04/21 2100 03/05/21 0000 03/05/21 0400   BP: 150/64 164/71 135/58 141/59   Pulse: 82 81 86    Resp: (!) 38  14    Temp:   98.2 F (36.8 C) 98.4 F (36.9 C)   TempSrc:   Oral Axillary   SpO2:   95%    Weight:    107.4 kg (236 lb 12.4 oz)   Height:           Intake and Output Summary (Last 24 hours) at Date Time    Intake/Output Summary (Last 24 hours) at 03/05/2021 0726  Last data filed at 03/05/2021 0400  Gross per 24 hour   Intake 840 ml   Output 3490 ml   Net -2650 ml       General: awake, alert, oriented x 3, no acute distress  Cardiovascular: regular rate and rhythm, no murmurs, rubs or gallops  Lungs: clear to auscultation bilaterally, without wheezing, rhonchi, or rales  Abdomen: soft, non-tender, non-distended, normoactive bowel sounds  Extremities: no clubbing, cyanosis, or edema      Meds:      Scheduled Meds: PRN Meds:    amLODIPine, 10 mg, Oral, Daily  aspirin, 81 mg, Oral, Daily  atorvastatin, 40 mg, Oral, Daily  calcium GLUConate, 1 g, Intravenous, Once  carvedilol, 12.5 mg, Oral, Q12H SCH  heparin (porcine), 5,000 Units, Subcutaneous, Q8H SCH  insulin glargine, 20  Units, Subcutaneous, Daily  insulin lispro, 1-6 Units, Subcutaneous, QHS  insulin lispro, 2-10 Units, Subcutaneous, TID AC  insulin regular, 5 Units, Intravenous, Once  pantoprazole, 40 mg, Oral, QAM AC  petrolatum, , Topical, Q12H  senna-docusate, 1 tablet, Oral, Q12H SCH  sodium bicarbonate, 50 mEq, Intravenous, Once          Continuous Infusions:   acetaminophen, 650 mg, Q6H PRN  dextrose, 15 g of glucose, PRN   And  dextrose, 12.5 g, PRN   And  dextrose, 12.5 g, PRN   And  glucagon (rDNA), 1 mg, PRN  hydrALAZINE, 10 mg, Q6H PRN  oxyCODONE, 5 mg, Q4H PRN              Labs:     Recent Labs   Lab 03/04/21  0300 03/04/21  0123   WBC 5.43 5.36   Hgb 11.3* 11.6  Hematocrit 37.3 38.4   Platelets 92* 90*     Recent Labs   Lab 03/04/21  1413 03/04/21  0300 03/04/21  0007   Sodium 138 136 135   Potassium 4.0 6.2* 6.9*   Chloride 101 100 102   CO2 23 19 18    BUN 55.0* 99.0* 99.0*   Creatinine 7.6* 11.4* 11.4*   Calcium 7.8* 7.9* 7.7*   Albumin  --   --  2.8*   Glucose 343* 517* 551*   EGFR 6.5 4.0 4.0             Invalid input(s): LEUKOCYTESUR        Imaging personally reviewed, including: No results found.        Signed by: Gaylyn Lambert, MD

## 2021-03-05 NOTE — Progress Notes (Signed)
Timeout and safety checks done.Report received from ICU RN.Pt alert and oriented X3,not in any distress.LAG + for bruit and thrill.No complaint of pain made at this time.Vital signs stable.Will continue to monitor during HD treatment.   03/05/21 0846   Bedside Nurse Communication   Name of bedside RN - pre dialysis Percell [place]   Treatment Initiation- With Dialysis Precautions   Time Out/Safety Check Completed Yes   Consent for HD signed for this hospitalization (Date) 02/04/21   Consent for HD signed for this hospitalization (Time) 1450   Blood Consent Verified N/A   Dialysis Precautions All Connections Secured;Saline Line Double Clamped;Venous Parameters Set;Arterial Parameters Set;Air Foam Detecctor Engaged   Dialysis Treatment Type Routine;ICU/CCU   Special Considerations On contact isolation for MRSA   Is patient diabetic? Yes   RO/Hemodialysis Architectural technologist   Is Total Chlorine less than 0.1 ppm? Yes   Orignial Total Chlorine Testing Time 0844   At 4 Hour Total Chlorine Testing Time 1244   RO/Hemodialysis Facilities manager Number 3    Machine Serial Number   [id number])   RO # 21   RO Serial # [id number]   Water Hardness NA   pH 7.4   Pressure Test Verified Yes   Alarms Verified Passed   Machine Temperature 98.6 F (37 C)   Alarms Verified Yes   Na+ mEq (Machine) 138 mEq   Bicarb mEq (Machine) 35 mEq   Hemodialysis Conductivity (Machine) 13.7   Hemodialysis Conductivity (Meter) 13.4   Dialyzer Lot Number [id number]   Tubing Lot Number [id number]   RO Machine Log Completed Yes   Hepatitis Status   HBsAg (Antigen) Result Negative   HBsAg Date Drawn 03/04/21   HBsAg Repeat Draw Due Date 04/01/21   Hepatitis C Antibody Non-Reactive  (Drawn 10/22/20)   Dialysis Weight   Pre-Treatment Weight (Kg) 107.4   Scale Type ICU Bed Scale   Vitals   Temp 97.3 F (36.3 C)   Heart Rate 72   Resp Rate (!) 32   BP 174/79   SpO2 93 %   O2 Device None (Room air)   Assessment   Mental Status  Alert;Oriented   Cardiac (WDL) WDL   Cardiac Regularity Regular   Cardiac Rhythm Normal Sinus Rhythm   Respiratory Pattern Regular   Bilateral Breath Sounds Clear;Diminished   Cough Spontaneous   RLE Edema +1   LLE Edema +1   General Skin Color Appropriate for ethnicity   Skin Condition/Temp Warm;Dry   Gastrointestinal (WDL) WDL   Abdomen Inspection Soft;Rounded   GI Symptoms None   Graft/Fistula Left   No placement date or time found.   Access Type: Arteriovenous Fistula  Orientation: Left  Graft/Fistula Location: Upper Arm   Fistula/ Graft Assessment Abnormalities WDL   Pain Assessment   Charting Type Assessment   Pain Scale Used Numeric Scale (0-10)   Numeric Pain Scale   Pain Score 0   POSS Score 1

## 2021-03-05 NOTE — Progress Notes (Signed)
PROGRESS NOTE    Date Time: 03/05/21 8:28 AM  Patient Name: [name], [name]  Patient status: Observation  Hospital Day: 0      Assessment:   Fluid overloading, dyspnea improving.  Hyperkalemia  End-stage renal disease-was noncompliant.  Diabetes type 2  Diabetic neuropathy  Bilateral toes with fungal infection.  Hypertension  Hyperlipidemia  COPD  Obstructive sleep apnea  Noncompliance with medical treatment and follow-up  Deconditioning    Plan:     Dialysis in progress, nephrology following.  Hyperkalemia corrected  Advised to comply with dialysis.  Blood sugar elevated continue Lantus, change sliding scale coverage to medium algorithm  Start gabapentin teen  Podiatry consultation requested.  Blood pressure controlled continue amlodipine, monitor blood pressure closely.  On Lipitor, aspirin.  Continue inhaler  Outpatient sleep study  Symptomatic treatment  DVT prophylaxis  Discharge planning when stable    Continue rest of treatment.  Discussed plan with patient and nurse  Labs and data reviewed.  Discussed with ICU nurse    Subjective:   Patient seen and examined :  Looks comfortable dialysis in progress complain of pain on feet.  Blood sugar elevated.  Patient had missed dialysis states did not feel like going to dialysis center.  Review of Systems:   General ROS: no fever no chills,   CVS ROS: no CP, no palpitation, no orthopnea, no PND  RESP ROS: no SOB, no cough, no sputum  GI ROS: no N/V, no diarrhea, no constipation, no melena, no hematochezia  GU ROS: no dysuria, no frequency, no hematuria, no flank pain  NEURO ROS: no focal weakness, no headache, no paraesthesia  MSK ROS: Gen weakness     Medications:     Current Facility-Administered Medications   Medication Dose Route Frequency    amLODIPine  10 mg Oral Daily    aspirin  81 mg Oral Daily    atorvastatin  40 mg Oral Daily    calcium GLUConate  1 g Intravenous Once    carvedilol  12.5 mg Oral Q12H SCH    heparin (porcine)  5,000 Units Subcutaneous Q8H  [place]    insulin glargine  20 Units Subcutaneous Daily    insulin lispro  1-6 Units Subcutaneous QHS    insulin lispro  2-10 Units Subcutaneous TID AC    insulin regular  5 Units Intravenous Once    pantoprazole  40 mg Oral QAM AC    petrolatum   Topical Q12H    senna-docusate  1 tablet Oral Q12H SCH    sodium bicarbonate  50 mEq Intravenous Once       acetaminophen, Nursing communication: Adult Hypoglycemia Treatment Algorithm **AND** dextrose **AND** dextrose **AND** dextrose **AND** glucagon (rDNA), hydrALAZINE, oxyCODONE, sodium chloride, sodium chloride    Physical Exam:   BP 141/59   Pulse 86   Temp 98.4 F (36.9 C) (Axillary)   Resp 14   Ht 1.549 m (5\' 1" )   Wt 107.4 kg (236 lb 12.4 oz)   LMP  (LMP Unknown) Comment: post menopausal  SpO2 95%   BMI 44.74 kg/m     Intake and Output Summary (Last 24 hours) at Date Time    Intake/Output Summary (Last 24 hours) at 03/05/2021 0828  Last data filed at 03/05/2021 0400  Gross per 24 hour   Intake 840 ml   Output 390 ml   Net 450 ml       General: awake, alert, oriented x 3; no acute distress  Neck: Supple no JVD  ENT: PERRLA, EOMI  Lungs: Basilar crackles no wheezing  Heart: Regular rate and rhythm no gallop  Abdomen: Soft nontender bowel sounds positive  Extremities: Warm moist bilateral great toenails dark color eschar on the toes.  Neuro: Moves upper and lower extremities.  GU: No CVA tenderness, dialysis dependent  Gait: Impaired    Labs:     CBC w/Diff CMP   Recent Labs   Lab 03/05/21  0803 03/04/21  0300 03/04/21  0123   WBC 5.05 5.43 5.36   Hgb 10.9* 11.3* 11.6   Hematocrit 34.9 37.3 38.4   Platelets 96* 92* 90*   MCV 93.3 94.4 94.1   Neutrophils 60.1 67.1 68.2       PT/INR   Recent Labs   Lab 03/04/21  0123   PT INR 1.2*       Recent Labs   Lab 03/04/21  1413 03/04/21  0300 03/04/21  0007   Sodium 138 136 135   Potassium 4.0 6.2* 6.9*   Chloride 101 100 102   CO2 23 19 18    BUN 55.0* 99.0* 99.0*   Creatinine 7.6* 11.4* 11.4*   Glucose 343* 517* 551*    Calcium 7.8* 7.9* 7.7*   Protein, Total  --   --  6.1   Albumin  --   --  2.8*   AST (SGOT)  --   --  14   ALT  --   --  20   Alkaline Phosphatase  --   --  203*   Bilirubin, Total  --   --  0.4      Glucose POCT   Recent Labs   Lab 03/04/21  1413 03/04/21  0300 03/04/21  0007   Glucose 343* 517* 551*        Recent Labs   Lab 03/04/21  0007   hs Troponin-I 102.0*       Rads:   No results found.      Barbaraann Faster, MD  03/05/2021    *This note was generated by the Epic EMR system/ Dragon speech recognition and may contain inherent errors or omissions not intended by the user. Grammatical errors, random word insertions, deletions, pronoun errors and incomplete sentences are occasional consequences of this technology due to software limitations. Not all errors are caught or corrected. If there are questions or concerns about the content of this note or information contained within the body of this dictation they should be addressed directly with the author for clarification

## 2021-03-06 LAB — BASIC METABOLIC PANEL
Anion Gap: 8 (ref 5.0–15.0)
BUN: 36 mg/dL — ABNORMAL HIGH (ref 7.0–21.0)
CO2: 28 mEq/L (ref 17–29)
Calcium: 7.6 mg/dL — ABNORMAL LOW (ref 8.5–10.5)
Chloride: 101 mEq/L (ref 99–111)
Creatinine: 5.4 mg/dL — ABNORMAL HIGH (ref 0.4–1.0)
Glucose: 360 mg/dL — ABNORMAL HIGH (ref 70–100)
Potassium: 4 mEq/L (ref 3.5–5.3)
Sodium: 137 mEq/L (ref 135–145)

## 2021-03-06 LAB — GLUCOSE WHOLE BLOOD - POCT
Whole Blood Glucose POCT: 278 mg/dL — ABNORMAL HIGH (ref 70–100)
Whole Blood Glucose POCT: 260 mg/dL — ABNORMAL HIGH (ref 70–100)

## 2021-03-06 LAB — CBC AND DIFFERENTIAL
Absolute NRBC: 0 10*3/uL (ref 0.00–0.00)
Basophils Absolute Automated: 0.03 10*3/uL (ref 0.00–0.08)
Basophils Automated: 0.7 %
Eosinophils Absolute Automated: 0.09 10*3/uL (ref 0.00–0.44)
Eosinophils Automated: 2.1 %
Hematocrit: 33.6 % — ABNORMAL LOW (ref 34.7–43.7)
Hgb: 10.4 g/dL — ABNORMAL LOW (ref 11.4–14.8)
Immature Granulocytes Absolute: 0.02 10*3/uL (ref 0.00–0.07)
Immature Granulocytes: 0.5 %
Instrument Absolute Neutrophil Count: 2.39 10*3/uL (ref 1.10–6.33)
Lymphocytes Absolute Automated: 1.14 10*3/uL (ref 0.42–3.22)
Lymphocytes Automated: 26.7 %
MCH: 29.5 pg (ref 25.1–33.5)
MCHC: 31 g/dL — ABNORMAL LOW (ref 31.5–35.8)
MCV: 95.2 fL (ref 78.0–96.0)
MPV: 12.2 fL (ref 8.9–12.5)
Monocytes Absolute Automated: 0.6 10*3/uL (ref 0.21–0.85)
Monocytes: 14.1 %
Neutrophils Absolute: 2.39 10*3/uL (ref 1.10–6.33)
Neutrophils: 55.9 %
Nucleated RBC: 0 /100 WBC (ref 0.0–0.0)
Platelets: 86 10*3/uL — ABNORMAL LOW (ref 142–346)
RBC: 3.53 10*6/uL — ABNORMAL LOW (ref 3.90–5.10)
RDW: 15 % (ref 11–15)
WBC: 4.27 10*3/uL (ref 3.10–9.50)

## 2021-03-06 LAB — GFR: EGFR: 9.6

## 2021-03-06 MED ORDER — CARVEDILOL 25 MG PO TABS
25.0000 mg | ORAL_TABLET | Freq: Two times a day (BID) | ORAL | Status: DC
Start: 2021-03-06 — End: 2021-03-06

## 2021-03-06 MED ORDER — INSULIN GLARGINE 100 UNIT/ML SC SOLN
30.0000 [IU] | Freq: Every day | SUBCUTANEOUS | Status: DC
Start: 2021-03-07 — End: 2021-03-06

## 2021-03-06 MED ORDER — GABAPENTIN 100 MG PO CAPS
100.0000 mg | ORAL_CAPSULE | Freq: Three times a day (TID) | ORAL | 0 refills | Status: DC
Start: 2021-03-06 — End: 2021-05-02

## 2021-03-06 MED ORDER — AQUAPHOR EX OINT
TOPICAL_OINTMENT | Freq: Two times a day (BID) | CUTANEOUS | 0 refills | Status: DC
Start: 2021-03-06 — End: 2021-06-01

## 2021-03-06 MED ORDER — OXYCODONE HCL 5 MG PO TABS
5.0000 mg | ORAL_TABLET | ORAL | 0 refills | Status: AC | PRN
Start: 2021-03-06 — End: 2021-03-13

## 2021-03-06 NOTE — Consults (Signed)
Podiatric Surgery Consultation    Reason for Consultation:  03/06/2021 9:20 AM    Assessment: [name] is a 66 y.o. female nail dystrophy     Plan:    -. Pt S & E. Bilateral toe dried blister debrided with #15 blade. Healed skin underneath. For nails will follow up outpatient.   -. WBAT   Thank you for the consult.    HPI: [name] is a 66 y.o. year old female.  P[facility] for dyspnea. H/o ESRD, DMII, COPD  doesn't recall any injury but has little feeling in the toes. Currently Denies F/C/N/V/NS/SOB      Past Medical History:   Past Medical History:   Diagnosis Date    Chronic obstructive pulmonary disease     Diabetes mellitus     Hyperlipidemia     Hypertension     Sleep apnea 2018       Past Surgical History:   Past Surgical History:   Procedure Laterality Date    EGD, BIOPSY N/A 05/14/2019    Procedure: EGD, BIOPSY;  Surgeon: Ronie Spies, MD;  Location: [facility];  Service: Gastroenterology;  Laterality: N/A;    EXPLORATORY LAPAROTOMY N/A 05/27/2019    Procedure: EXPLORATORY LAPAROTOMY;  Surgeon: Herbert Moors., MD;  Location: [facility];  Service: General;  Laterality: N/A;    JOINT REPLACEMENT      shoulder    JOINT REPLACEMENT      knee    OVARY SURGERY Left     Removal    REMOVAL, FOREIGN BODY N/A 05/27/2019    Procedure: REMOVAL, PERITONEAL DIALYSIS CATHETER;  Surgeon: Herbert Moors., MD;  Location: [facility];  Service: General;  Laterality: N/A;    TUNNELED CATH CHECK/CHANGE (PERMCATH) N/A 07/03/2019    Procedure: TUNNELED CATH CHECK/CHANGE;  Surgeon: Maureen Ralphs, MD;  Location: [facility];  Service: Interventional Radiology;  Laterality: N/A;    TUNNELED CATH PLACEMENT (PERMCATH) Bilateral 05/23/2019    Procedure: TUNNELED CATH PLACEMENT;  Surgeon: Jacques Earthly, MD;  Location: [facility];  Service: Interventional Radiology;  Laterality: Bilateral;    TUNNELED CATH PLACEMENT (PERMCATH) Right 07/31/2019    Procedure: TUNNELED CATH PLACEMENT;   Surgeon: Jacques Earthly, MD;  Location: [facility];  Service: Interventional Radiology;  Laterality: Right;       Medications:   Home Meds:   Prior to Admission medications    Medication Sig Start Date End Date Taking? Authorizing Provider   acetaminophen (TYLENOL) 500 MG tablet Take 500 mg by mouth every 6 (six) hours as needed for Pain    [provider]   albuterol sulfate HFA (PROVENTIL) 108 (90 Base) MCG/ACT inhaler Inhale 2 puffs into the lungs every 6 (six) hours as needed for Wheezing or Shortness of Breath    [provider]   amLODIPine (NORVASC) 10 MG tablet Take 1 tablet by mouth daily    [provider]   aspirin 81 MG chewable tablet Chew 81 mg by mouth daily    [provider]   atorvastatin (LIPITOR) 40 MG tablet Take 40 mg by mouth daily    [provider]   carvedilol (COREG) 25 MG tablet Take 1 tablet by mouth 2 (two) times daily with meals 07/22/20   [provider]   dextromethorphan-guaiFENesin (MUCINEX DM) 30-600 MG per 12 hr tablet Take 1 tablet by mouth every 12 (twelve) hours 02/08/21   [place], Lawerance Sabal, MD  gabapentin (NEURONTIN) 600 MG tablet Take 1 capsule by mouth every 8 (eight) hours    [provider]   insulin aspart (NovoLOG) 100 UNIT/ML injection Inject 15 Units into the skin 3 (three) times daily before meals    [provider]   Insulin Degludec (TRESIBA SC) Inject 35 Unit into the skin    [provider]   pantoprazole (PROTONIX) 40 MG tablet Take 40 mg by mouth daily    [provider]   sevelamer (RENVELA) 800 MG tablet Take 800 mg by mouth 2 (two) times daily with meals    [provider]      Current Hospital Meds:   Current Facility-Administered Medications:     acetaminophen (TYLENOL) tablet 650 mg, 650 mg, Oral, Q6H PRN, Al Samarah, Husam T, MD    amLODIPine (NORVASC) tablet 10 mg, 10 mg, Oral, Daily, Al Samarah, Husam T, MD, 10 mg at 03/06/21 0851    aspirin chewable  tablet 81 mg, 81 mg, Oral, Daily, Al Samarah, Husam T, MD, 81 mg at 03/06/21 0850    atorvastatin (LIPITOR) tablet 40 mg, 40 mg, Oral, Daily, Al Samarah, Husam T, MD, 40 mg at 03/06/21 0850    calcium gluconate 1 g in 50 mL premix, 1 g, Intravenous, Once, Ranelle Oyster B, PA    carvedilol (COREG) tablet 12.5 mg, 12.5 mg, Oral, Q12H [place], Modesto Charon, MD, 12.5 mg at 03/06/21 0900    Nursing communication: Adult Hypoglycemia Treatment Algorithm, , , Until Discontinued **AND** dextrose (GLUCOSE) 40 % oral gel 15 g of glucose, 15 g of glucose, Oral, PRN **AND** dextrose (D10W) 10% bolus 125 mL, 12.5 g, Intravenous, PRN **AND** dextrose 50 % bolus 12.5 g, 12.5 g, Intravenous, PRN **AND** glucagon (rDNA) (GLUCAGEN) injection 1 mg, 1 mg, Intramuscular, PRN, [place] Charon, MD    Nursing communication: Adult Hypoglycemia Treatment Algorithm, , , Until Discontinued **AND** dextrose (GLUCOSE) 40 % oral gel 15 g of glucose, 15 g of glucose, Oral, PRN **AND** dextrose (D10W) 10% bolus 125 mL, 12.5 g, Intravenous, PRN **AND** dextrose 50 % bolus 12.5 g, 12.5 g, Intravenous, PRN **AND** glucagon (rDNA) (GLUCAGEN) injection 1 mg, 1 mg, Intramuscular, PRN, Felipe Drone, [place], MD    gabapentin (NEURONTIN) capsule 100 mg, 100 mg, Oral, Q8H [place], Felipe Drone, [place], MD, 100 mg at 03/06/21 0641    heparin (porcine) 5000 UNIT/ML injection 5,000 Units, 5,000 Units, Subcutaneous, Q8H [place], Modesto Charon, MD, 5,000 Units at 03/06/21 0641    hydrALAZINE (APRESOLINE) injection 10 mg, 10 mg, Intravenous, Q6H PRN, Felipe Drone, [place], MD    insulin glargine (LANTUS) injection 20 Units, 20 Units, Subcutaneous, Daily, Al Samarah, Husam T, MD, 20 Units at 03/06/21 0908    insulin lispro injection 1-4 Units, 1-4 Units, Subcutaneous, QHS, 3 Units at 03/05/21 2138 **AND** NSG Communication: Glucose POCT order, , , At bedtime, Felipe Drone, [place], MD    insulin lispro injection 1-8 Units, 1-8 Units, Subcutaneous, TID AC, 5 Units at 03/06/21 0908 **AND** NSG  Communication: Glucose POCT order, , , AC, [place], Mussarat, MD    oxyCODONE (ROXICODONE) immediate release tablet 5 mg, 5 mg, Oral, Q4H PRN, Al Samarah, Husam T, MD, 5 mg at 03/06/21 0645    pantoprazole (PROTONIX) EC tablet 40 mg, 40 mg, Oral, QAM AC, Al Samarah, Husam T, MD, 40 mg at 03/06/21 0850    petrolatum (AQUAPHOR) ointment, , Topical, Q12H, Al Samarah, Husam T, MD, Given at 03/06/21 0901    senna-docusate (PERICOLACE) 8.6-50 MG per tablet 1 tablet,  1 tablet, Oral, Q12H [place], Al Samarah, Husam T, MD, 1 tablet at 03/06/21 0852    sodium bicarbonate 8.4 % injection 50 mEq, 50 mEq, Intravenous, Once, [place]ose [place], [place]      Allergies:   Allergies   Allergen Reactions    Lisinopril Hives    Metformin Hives       Family History:   Family History   Problem Relation Age of Onset    Diabetes Mother     Hypertension Mother     Diabetes Father     Hypertension Father     Diabetes Sister     Hypertension Sister     Diabetes Brother     Hypertension Brother     Diabetes Paternal Aunt     Diabetes Paternal Uncle        Social History:   Social History     Socioeconomic History    Marital status: Legally Separated   Tobacco Use    Smoking status: Never    Smokeless tobacco: Never   Vaping Use    Vaping Use: Never used   Substance and Sexual Activity    Alcohol use: Never    Drug use: Never    Sexual activity: Not Currently       Review of Systems:  Negative for chest pain and shortness of breath    Physical exam:       Patient is a 66 y.o. year old female who is alert, well appearing, and in no distress.  Vitals:    03/06/21 0802   BP: 165/68   Pulse: 83   Resp: 19   Temp: 98.1 F (36.7 C)   SpO2: 96%       107.4 kg (236 lb 12.4 oz)   1.549 m (5\' 1" )  Estimated body mass index is 44.74 kg/m as calculated from the following:    Height as of this encounter: 1.549 m (5\' 1" ).    Weight as of this encounter: 107.4 kg (236 lb 12.4 oz).    Both Lower Extremity:   Vascular: Dorsalis pedis: present, Posterior Tibial:present.  Pedal edema is mild.     Derm:  normal. No ascending cellulitis.    Wound: dried blood blister distal bilateral hallux   Ortho: Gross motor intact.  Tenderness to palpation none   Neuro: Protective sensation diminished.         Labs:    Results       Procedure Component Value Units Date/Time    Glucose Whole Blood - POCT [[id number]]  (Abnormal) Collected: 03/06/21 0745     Updated: 03/06/21 0751     Whole Blood Glucose POCT 278 mg/dL     Culture Blood Aerobic and Anaerobic [[id number]] Collected: 03/04/21 0123    Specimen: Arm from Blood, Venipuncture Updated: 03/06/21 0521    Narrative:      ORDER#: [id number]                                    ORDERED BY: HE, ALBERT  SOURCE: Blood, Venipuncture Arm                      COLLECTED:  03/04/21 01:23  ANTIBIOTICS AT COLL.:                                RECEIVED :  03/04/21 04:52  Culture Blood Aerobic and Anaerobic        PRELIM      03/06/21 05:21  03/05/21   No Growth after 1 day/s of incubation.  03/06/21   No Growth after 2 day/s of incubation.      Culture Blood Aerobic and Anaerobic [[id number]] Collected: 03/04/21 0123    Specimen: Arm from Blood, Venipuncture Updated: 03/06/21 0521    Narrative:      ORDER#: [id number]                                    ORDERED BY: HE, ALBERT  SOURCE: Blood, Venipuncture Arm                      COLLECTED:  03/04/21 01:23  ANTIBIOTICS AT COLL.:                                RECEIVED :  03/04/21 04:52  Culture Blood Aerobic and Anaerobic        PRELIM      03/06/21 05:21  03/05/21   No Growth after 1 day/s of incubation.  03/06/21   No Growth after 2 day/s of incubation.      CBC and differential [[id number]]  (Abnormal) Collected: 03/06/21 0348    Specimen: Blood Updated: 03/06/21 0513     WBC 4.27 x10 3/uL      Hgb 10.4 g/dL      Hematocrit 33.6 %      Platelets 86 x10 3/uL      RBC 3.53 x10 6/uL      MCV 95.2 fL      MCH 29.5 pg      MCHC 31.0 g/dL      RDW 15 %      MPV 12.2 fL      Instrument Absolute Neutrophil Count 2.39 x10  3/uL      Neutrophils 55.9 %      Lymphocytes Automated 26.7 %      Monocytes 14.1 %      Eosinophils Automated 2.1 %      Basophils Automated 0.7 %      Immature Granulocytes 0.5 %      Nucleated RBC 0.0 /100 WBC      Neutrophils Absolute 2.39 x10 3/uL      Lymphocytes Absolute Automated 1.14 x10 3/uL      Monocytes Absolute Automated 0.60 x10 3/uL      Eosinophils Absolute Automated 0.09 x10 3/uL      Basophils Absolute Automated 0.03 x10 3/uL      Immature Granulocytes Absolute 0.02 x10 3/uL      Absolute NRBC 0.00 x10 3/uL     Basic Metabolic Panel [[id number]]  (Abnormal) Collected: 03/06/21 0348    Specimen: Blood Updated: 03/06/21 0447     Glucose 360 mg/dL      BUN 36.0 mg/dL      Creatinine 5.4 mg/dL      Calcium 7.6 mg/dL      Sodium 137 mEq/L      Potassium 4.0 mEq/L      Chloride 101 mEq/L      CO2 28 mEq/L      Anion Gap 8.0    GFR [[id number]] Collected: 03/06/21 0348     Updated: 03/06/21 0447     EGFR 9.6  Glucose Whole Blood - POCT [[id number]]  (Abnormal) Collected: 03/05/21 2114     Updated: 03/05/21 2118     Whole Blood Glucose POCT 283 mg/dL     Glucose Whole Blood - POCT [[id number]]  (Abnormal) Collected: 03/05/21 1555     Updated: 03/05/21 1558     Whole Blood Glucose POCT 288 mg/dL     Glucose Whole Blood - POCT [[id number]]  (Abnormal) Collected: 03/05/21 1200     Updated: 03/05/21 1228     Whole Blood Glucose POCT 178 mg/dL                     Ladona Horns, DPM

## 2021-03-06 NOTE — Plan of Care (Signed)
Patient stable upon discharge. No complaints voiced at this time. Discharge instructions given and verbalized understanding. Patient left accompanied by transport, with all her belongings

## 2021-03-06 NOTE — Discharge Instr - AVS First Page (Addendum)
Date of Admission: 03/03/2021    Date of Discharge: 03/06/2021    Discharge Physician: Barbaraann Faster, MD, MD    Dear [name],     Thank you for choosing [facility] for your emergency care needs. We strive to provide EXCELLENT care to you and your family.     In an effort to explain clearly why you were here in the hospital, I've written a very brief summary. I hope that you find it useful. Other details including formal diagnosis, medication changes, follow up appointment recommendations, and access to MyChart for formal medical records can be found in this packet.       You were admitted for:   Fluid overloading, dyspnea improving.  Hyperkalemia  End-stage renal disease-was noncompliant.  Diabetes type 2  Diabetic neuropathy  Bilateral toes with fungal infection.  Hypertension  Hyperlipidemia  COPD  Obstructive sleep apnea  Noncompliance with medical treatment and follow-up  Deconditioning  Follow With podiatry as outpatient.  Take care of your nails cut them on time to prevent future wounds.  Continue dialysis per schedule    If you were prescribed medications, they were either sent to your pharmacy or provided to you as paper prescriptions.      Please Make sure to follow up with your primary care doctor within 1 week.     Please don't hesitate to call me with any questions. It was a pleasure taking care of you, please do not hesitate to call me on number listed below for any questions      Finally, as your discharging physician, you may be receiving a survey which is regarding my care. I would greatly value and appreciate your feedback as I strive for excellence.     Respectfully yours,    Barbaraann Faster, MD, MD        [facility]   [address]   [id number]   [place] [address]   [phone]

## 2021-03-06 NOTE — Discharge Instr - Diet (Signed)
Renal diet / diabetic diet

## 2021-03-06 NOTE — Discharge Instr - Activity (Signed)
Activity as tolerated with safety precautions, use walker or stand by assist as needed

## 2021-03-06 NOTE — Discharge Summary (Signed)
DISCHARGE SUMMARY    Date Time: 03/06/21 12:39 PM  Patient Name: [name]  Attending Physician: Barbaraann Faster, MD    Date of Admission:   03/03/2021    Date of Discharge:   03/06/2021    Reason for Admission:   Hyperkalemia [E87.5]  SOB (shortness of breath) [R06.02]  Elevated troponin [R77.8]  Non compliance with medical treatment [Z91.199]  ESRD (end stage renal disease) on dialysis [N18.6, Z99.2]  Hypertensive urgency [I16.0]    Discharge Dx:   Present on Admission:   Hyperkalemia  Fluid overloading, dyspnea improving.  Hyperkalemia  End-stage renal disease-was noncompliant.  Diabetes type 2  Diabetic neuropathy  Bilateral toes with fungal infection.  Hypertension  Hyperlipidemia  COPD  Obstructive sleep apnea  Noncompliance with medical treatment and follow-up  Deconditioning    Consultations:   Treatment Team:   Attending Provider: Barbaraann Faster, MD  Consulting Physician: Armando Reichert, MD  Consulting Physician: Barbaraann Faster, MD  Consulting Physician: Ladona Horns, DPM      Procedures performed:   Dialysis per schedule    Discharge Medications:        Discharge Medication List        Taking      acetaminophen 500 MG tablet  Dose: 500 mg  Commonly known as: TYLENOL  Take 500 mg by mouth every 6 (six) hours as needed for Pain     albuterol sulfate HFA 108 (90 Base) MCG/ACT inhaler  Dose: 2 puff  Commonly known as: PROVENTIL  Inhale 2 puffs into the lungs every 6 (six) hours as needed for Wheezing or Shortness of Breath     amLODIPine 10 MG tablet  Dose: 1 tablet  Commonly known as: NORVASC  Take 1 tablet by mouth daily     aspirin 81 MG chewable tablet  Dose: 81 mg  Chew 81 mg by mouth daily     atorvastatin 40 MG tablet  Dose: 40 mg  Commonly known as: LIPITOR  Take 40 mg by mouth daily     carvedilol 25 MG tablet  Dose: 1 tablet  Commonly known as: COREG  Take 1 tablet by mouth 2 (two) times daily with meals     dextromethorphan-guaiFENesin 30-600 MG per 12 hr tablet  Dose: 1  tablet  Commonly known as: MUCINEX DM  Take 1 tablet by mouth every 12 (twelve) hours     gabapentin 100 MG capsule  Dose: 100 mg  Commonly known as: NEURONTIN  Take 1 capsule (100 mg) by mouth every 8 (eight) hours  Replaces: gabapentin 600 MG tablet     insulin aspart 100 UNIT/ML injection  Dose: 15 Units  Commonly known as: NovoLOG  Inject 15 Units into the skin 3 (three) times daily before meals     oxyCODONE 5 MG immediate release tablet  Dose: 5 mg  Commonly known as: ROXICODONE  Take 1 tablet (5 mg) by mouth every 4 (four) hours as needed for Pain     pantoprazole 40 MG tablet  Dose: 40 mg  Commonly known as: PROTONIX  Take 40 mg by mouth daily     petrolatum ointment  Apply topically every 12 (twelve) hours     sevelamer 800 MG tablet  Dose: 800 mg  Commonly known as: RENVELA  Take 800 mg by mouth 2 (two) times daily with meals     TRESIBA SC  Dose: 35 Unit  Inject 35 Unit into the skin  STOP taking these medications      gabapentin 600 MG tablet  Commonly known as: NEURONTIN  Replaced by: gabapentin 100 MG capsule                Hospital Course:   Details of admission and consultations on record.    [name] is a 66 y.o. female with history of end-stage renal disease who had missed dialysis has history of diabetes hypertension hyperlipidemia COPD obstructive sleep apnea deconditioning, noncompliance with medication who presented to emergency room with complaints of shortness of breath for 1 week patient had missed dialysis for almost 1 week patient had hyperkalemia elevated blood pressure blood sugar and was admitted to intensive care unit for urgent dialysis and immediate care in  higher level in hospital.  Patient had dialysis fluid balance was improved oxygenation was improved hyperkalemia was improved she was resumed on home medication and was given as needed medicines for elevated blood pressure.  She was started on sliding scale coverage, Lantus.  She was initially monitored in ICU  subsequently transferred to medical floor patient had been having her dialysis per schedule.  Her fluid overloading improved oxygenation improved currently she is off oxygen electrolytes got corrected.  Blood pressure and blood sugars improved she will be resumed on home dose of long-acting insulin.  She had noncompliance with medication, advised to comply with dialysis as well as with medication to prevent future hospitalization.  She had poor nail hygiene, big toe with black color.  Patient had previously a blister on the toe patient was seen by podiatry, recommended , outpatient follow-up with podiatry, she had bilateral toe dry blister debrided skin was healed underneath.  She is cleared by nephrology for discharge with recommendations to continue dialysis per schedule as an outpatient.  Called dtr msg left , called husband multiple , eventually msg left By Lieutenant Diego ( I was there too ) for DCP /  Further hospital course per daily Notes .    Laboratory Data     CBC  Recent Labs   Lab 03/06/21  0348 03/05/21  0803 03/04/21  0300 03/04/21  0123   WBC 4.27 5.05 5.43 5.36   Hgb 10.4* 10.9* 11.3* 11.6   Hematocrit 33.6* 34.9 37.3 38.4   Platelets 86* 96* 92* 90*   MCV 95.2 93.3 94.4 94.1   Neutrophils 55.9 60.1 67.1 68.2       CMP  Recent Labs   Lab 03/06/21  0348 03/05/21  0803 03/04/21  1413 03/04/21  0300 03/04/21  0007   Sodium 137 138 138 136 135   Potassium 4.0 4.4 4.0 6.2* 6.9*   Chloride 101 103 101 100 102   CO2 28 24 23 19 18    BUN 36.0* 65.0* 55.0* 99.0* 99.0*   Creatinine 5.4* 8.4* 7.6* 11.4* 11.4*   Glucose 360* 318* 343* 517* 551*   Calcium 7.6* 7.5* 7.8* 7.9* 7.7*   Protein, Total  --   --   --   --  6.1   Albumin  --   --   --   --  2.8*   AST (SGOT)  --   --   --   --  14   ALT  --   --   --   --  20   Alkaline Phosphatase  --   --   --   --  203*   Bilirubin, Total  --   --   --   --  0.4       Lipid panel              Lab Results   Component Value Date    TSH 0.97 02/05/2021       Cardiac  enzymes  Recent Labs   Lab 03/04/21  0007   hs Troponin-I 102.0*       Recent Labs   Lab 03/04/21  0123   PT 13.6*   PT INR 1.2*   PTT 30       Culture:   Microbiology Results (last 15 days)       Procedure Component Value Units Date/Time    Culture Blood Aerobic and Anaerobic [[id number]] Collected: 03/04/21 0123    Order Status: Completed Specimen: Arm from Blood, Venipuncture Updated: 03/06/21 0521    Narrative:      ORDER#: [id number]                                    ORDERED BY: HE, ALBERT  SOURCE: Blood, Venipuncture Arm                      COLLECTED:  03/04/21 01:23  ANTIBIOTICS AT COLL.:                                RECEIVED :  03/04/21 04:52  Culture Blood Aerobic and Anaerobic        PRELIM      03/06/21 05:21  03/05/21   No Growth after 1 day/s of incubation.  03/06/21   No Growth after 2 day/s of incubation.      Culture Blood Aerobic and Anaerobic [[id number]] Collected: 03/04/21 0123    Order Status: Completed Specimen: Arm from Blood, Venipuncture Updated: 03/06/21 0521    Narrative:      ORDER#: [id number]                                    ORDERED BY: HE, ALBERT  SOURCE: Blood, Venipuncture Arm                      COLLECTED:  03/04/21 01:23  ANTIBIOTICS AT COLL.:                                RECEIVED :  03/04/21 04:52  Culture Blood Aerobic and Anaerobic        PRELIM      03/06/21 05:21  03/05/21   No Growth after 1 day/s of incubation.  03/06/21   No Growth after 2 day/s of incubation.      COVID-19 (SARS-CoV-2) and Influenza A/B, NAA (Liat Rapid)- Admission [[id number]] Collected: 03/04/21 0056    Order Status: Completed Specimen: Culturette from Nasopharyngeal Updated: 03/04/21 0141     Purpose of COVID testing Diagnostic -PUI     SARS-CoV-2 Specimen Source Nasal Swab     SARS CoV 2 Overall Result Not Detected     Comment: __________________________________________________  -A result of "Detected" indicates POSITIVE for the    presence of SARS CoV-2 RNA  -A result of "Not Detected" indicates  NEGATIVE for the    presence of SARS CoV-2 RNA  __________________________________________________________  Test performed using the  Roche cobas Liat SARS-CoV-2 assay. This assay is  only for use under the Food and Drug Administration's Emergency Use  Authorization. This is a real-time RT-PCR assay for the qualitative  detection of SARS-CoV-2 RNA. Viral nucleic acids may persist in vivo,  independent of viability. Detection of viral nucleic acid does not imply the  presence of infectious virus, or that virus nucleic acid is the cause of  clinical symptoms. Negative results do not preclude SARS-CoV-2 infection and  should not be used as the sole basis for diagnosis, treatment or other  patient management decisions. Negative results must be combined with  clinical observations, patient history, and/or epidemiological information.  Invalid results may be due to inhibiting substances in the specimen and  recollection should occur. Please see Fact Sheets for patients and providers  located:  https://www.benson-chung.com/          Influenza A Not Detected     Influenza B Not Detected     Comment: Test performed using the Roche cobas Liat SARS-CoV-2 & Influenza A/B assay.  This assay is only for use under the Food and Drug Administration's  Emergency Use Authorization. This is a multiplex real-time RT-PCR assay  intended for the simultaneous in vitro qualitative detection and  differentiation of SARS-CoV-2, influenza A, and influenza B virus RNA. Viral  nucleic acids may persist in vivo, independent of viability. Detection of  viral nucleic acid does not imply the presence of infectious virus, or that  virus nucleic acid is the cause of clinical symptoms. Negative results do  not preclude SARS-CoV-2, influenza A, and/or influenza B infection and  should not be used as the sole basis for diagnosis, treatment or other  patient management decisions. Negative results must be combined with  clinical  observations, patient history, and/or epidemiological information.  Invalid results may be due to inhibiting substances in the specimen and  recollection should occur. Please see Fact Sheets for patients and providers  located: http://olson-hall.info/.         Narrative:      o Collect and clearly label specimen type:  o PREFERRED-Upper respiratory specimen: One Nasal Swab in  Transport Media.  o Hand deliver to laboratory ASAP  Diagnostic -PUI            All radiology result for current encounter:  Chest AP Portable    Result Date: 03/04/2021  Suspect mild recurrent pulmonary edema. Keith Rake, MD  03/04/2021 1:36 AM     Echo Results       None              Physical Exam:   BP 108/53   Pulse 67   Temp 97.7 F (36.5 C) (Oral)   Resp 19   Ht 1.549 m (5\' 1" )   Wt 107.4 kg (236 lb 12.4 oz)   LMP  (LMP Unknown) Comment: post menopausal  SpO2 93%   BMI 44.74 kg/m     General appearance - alert, and in no distress  HEENT: Normocephalic,atraumatic, pupil equal  Neck - supple  Chest - clear to auscultation  Heart - normal rate and regular rhythm  Abdomen - bowel sounds normal, soft, non distended  Extremities - no pedal edema ,nails hygiene poor.  Bilateral big toenails status post debridement healthy skin underneath  Nails, poor hygiene, healing toe blister  Neurological - Alert , awake, no distress    Discharge condition:   Stable     Discharge  Diet :   Cardiac , diabetic ,  renal     Discharge Instructions:   Follow up:     Ladona Horns, [place]  [address] [place] [address]  [phone]    Follow up      Horton Marshall, [place] [address]  [place] [address]  [phone]    Schedule an appointment as soon as possible for a visit          Barbaraann Faster, MD  03/06/2021  12:39 PM  Time spent 45  minutes

## 2021-03-06 NOTE — Progress Notes (Signed)
[facility] PROGRESS NOTE  [id number]      Date Time: 03/06/21 12:51 PM  Patient Name: [name]  Attending Physician: Barbaraann Faster, MD    CC: follow-up ESRD     Assessment:     ESRD on HD MWF at [facility] - followed by Dr. Randel Pigg  - Missed HD on 2/6, 2/8 due to not feeling well   - Last HD 1 week ago   Accelerated hypertension due to volume overload in the setting of missed HD  Volume overload - UF as needed  Anemia in CKD - Hgb on goal   Hyperphosphatemia - monitor , on sevelamer   Secondary hyperparathyroidism - monitor     Recommendations:     - HD tomorrow at [address]   - Stable from renal stand point for discharge       Case discussed with: pt      Gaylyn Lambert, MD  [facility]  [phone] (office)      Subjective:     On RA, no distress   Eating lunch    Review of Systems:   No cp  No sob      Physical Exam:     Vitals:    03/06/21 0344 03/06/21 0350 03/06/21 0802 03/06/21 1129   BP: 129/56  165/68 108/53   Pulse: 73 65 83 67   Resp:   19    Temp: 97.9 F (36.6 C)  98.1 F (36.7 C) 97.7 F (36.5 C)   TempSrc: Oral  Oral Oral   SpO2: (!) 79% 93% 96% 93%   Weight:       Height:           Intake and Output Summary (Last 24 hours) at Date Time    Intake/Output Summary (Last 24 hours) at 03/06/2021 1251  Last data filed at 03/06/2021 0500  Gross per 24 hour   Intake 530 ml   Output --   Net 530 ml         General: awake, alert, oriented x 3, no acute distress  Cardiovascular: regular rate and rhythm, no murmurs, rubs or gallops  Lungs: clear to auscultation bilaterally, without wheezing, rhonchi, or rales  Abdomen: soft, non-tender, non-distended, normoactive bowel sounds  Extremities: no clubbing, cyanosis, or edema      Meds:      Scheduled Meds: PRN Meds:    amLODIPine, 10 mg, Oral, Daily  aspirin, 81 mg, Oral, Daily  atorvastatin, 40 mg, Oral, Daily  calcium GLUConate, 1 g, Intravenous, Once  carvedilol, 25 mg, Oral, Q12H SCH  gabapentin, 100 mg, Oral,  Q8H SCH  heparin (porcine), 5,000 Units, Subcutaneous, Q8H [place]  [START ON 03/07/2021] insulin glargine, 30 Units, Subcutaneous, Daily  insulin lispro, 1-4 Units, Subcutaneous, QHS  insulin lispro, 1-8 Units, Subcutaneous, TID AC  pantoprazole, 40 mg, Oral, QAM AC  petrolatum, , Topical, Q12H  senna-docusate, 1 tablet, Oral, Q12H SCH  sodium bicarbonate, 50 mEq, Intravenous, Once        Continuous Infusions:   acetaminophen, 650 mg, Q6H PRN  dextrose, 15 g of glucose, PRN   And  dextrose, 12.5 g, PRN   And  dextrose, 12.5 g, PRN   And  glucagon (rDNA), 1 mg, PRN  dextrose, 15 g of glucose, PRN   And  dextrose, 12.5 g, PRN   And  dextrose, 12.5 g, PRN   And  glucagon (rDNA), 1 mg, PRN  hydrALAZINE, 10 mg, Q6H PRN  oxyCODONE, 5 mg, Q4H PRN            Labs:     Recent Labs   Lab 03/06/21  0348 03/05/21  0803 03/04/21  0300   WBC 4.27 5.05 5.43   Hgb 10.4* 10.9* 11.3*   Hematocrit 33.6* 34.9 37.3   Platelets 86* 96* 92*       Recent Labs   Lab 03/06/21  0348 03/05/21  0803 03/04/21  1413 03/04/21  0300 03/04/21  0007   Sodium 137 138 138  More results in Results Review 135   Potassium 4.0 4.4 4.0  More results in Results Review 6.9*   Chloride 101 103 101  More results in Results Review 102   CO2 28 24 23   More results in Results Review 18   BUN 36.0* 65.0* 55.0*  More results in Results Review 99.0*   Creatinine 5.4* 8.4* 7.6*  More results in Results Review 11.4*   Calcium 7.6* 7.5* 7.8*  More results in Results Review 7.7*   Albumin  --   --   --   --  2.8*   Glucose 360* 318* 343*  More results in Results Review 551*   EGFR 9.6 5.8 6.5  More results in Results Review 4.0   More results in Results Review = values in this interval not displayed.               Invalid input(s): LEUKOCYTESUR        Imaging personally reviewed, including: No results found.        Signed by: Gaylyn Lambert, MD

## 2021-03-06 NOTE — Discharge Instructions (Addendum)
Gabapentin Oral Capsule  Brands: Neurontin  Uses  This medicine is used for the following purposes:  menopausal symptoms  pain  restless leg syndrome  seizures  alcohol dependence  tremors  Instructions  Swallow the medicine without crushing or chewing it.  This medicine may be taken with or without food.  It is very important that you take the medicine at about the same time every day. It will work best if you do this.  Keep the medicine at room temperature. Avoid heat and direct light.  Do not take any antacid or vitamins with magnesium, calcium, aluminum, or iron for 2 hours before and 2 hours after taking this medicine.  It is important that you keep taking each dose of this medicine on time even if you are feeling well.  If you forget to take a dose on time, take it as soon as you remember. If it is almost time for the next dose, do not take the missed dose. Return to your normal dosing schedule. Do not take 2 doses of this medicine at one time.  Please tell your doctor and pharmacist about all the medicines you take. Include both prescription and over-the-counter medicines. Also tell them about any vitamins, herbal medicines, or anything else you take for your health.  Do not suddenly stop taking this medicine. Check with your doctor before stopping.  Cautions  Tell your doctor and pharmacist if you ever had an allergic reaction to a medicine. Symptoms of an allergic reaction can include trouble breathing, skin rash, itching, swelling, or severe dizziness.  Some patients taking this medicine have experienced serious side effects. Please speak with your doctor to understand the risks and benefits associated with this medicine.  Do not use the medication any more than instructed.  If possible, avoid using with marijuana or other medicines that can cause dizziness or drowsiness. These include allergy/cold products, muscle relaxers, sleep aids, and pain relievers.  Your ability to stay alert or to react quickly  may be impaired by this medicine. Do not drive or operate machinery until you know how this medicine will affect you.  Do not drink beverages with alcohol while on this medicine.  Family should check on the patient often. Call the doctor if patient becomes more depressed, has thoughts of suicide, or shows changes in behavior.  Call the doctor if there are any signs of confusion or unusual changes in behavior.  Tell the doctor or pharmacist if you are pregnant, planning to be pregnant, or breastfeeding.  Do not start or stop any other medicines without first speaking to your doctor or pharmacist.  This medicine should be used with caution in patients with breathing difficulties.  Call your doctor right away if you notice slow or shallow breathing.  Do not share this medicine with anyone who has not been prescribed this medicine.  This medicine can cause serious side effects in some patients. Important information from the U.S. Food and Drug Administration (FDA) is available from your pharmacist. Please review it carefully with your pharmacist to understand the risks associated with this medicine.  Side Effects  The following is a list of some common side effects from this medicine. Please speak with your doctor about what you should do if you experience these or other side effects.  dizziness  drowsiness or sedation  lack of energy and tiredness  Call your doctor or get medical help right away if you notice any of these more serious side effects:  shallow,  irregular breathing  fever  swelling in the neck or throat  A few people may have an allergic reaction to this medicine. Symptoms can include difficulty breathing, skin rash, itching, swelling, or severe dizziness. If you notice any of these symptoms, seek medical help quickly.  Extra  Please speak with your doctor, nurse, or pharmacist if you have any questions about this medicine.  https://api.meducation.com/V2.0/fdbpem/[email or web address] NOTE: This document tells you  briefly how to take your medicine, but it does not tell you all there is to know about it. Your doctor or pharmacist may give you other documents about your medicine. Please talk to them if you have any questions. Always follow their advice. There is a more complete description of this medicine available in [place]. Scan this code on your smartphone or tablet or use the web address below. You can also ask your pharmacist for a printout. If you have any questions, please ask your pharmacist. The display and use of this drug information is subject to Terms of Use. Copyright(c) 2022 [employer]    [phone] The [place]. All rights reserved. This information is not intended as a substitute for professional medical care. Always follow your healthcare professional's instructions.        Oxycodone Oral Tablet  Brands: Roxicodone  Uses  For pain.  Instructions  This medicine may be taken with or without food.  Swallow with a full glass (8 oz) of water unless your doctor gives you different instructions.  Store at room temperature away from heat, light, and moisture. Do not keep in the bathroom.  Please ask your doctor, nurse, or pharmacist how to discard unused medicines safely.  To reduce constipation, eat high fiber foods, drink plenty of water and exercise.  Avoid grapefruit juice while on this medicine.  Drug interactions can change how medicines work or increase risk for side effects. Tell your health care providers about all medicines taken. Include prescription and over-the-counter medicines, vitamins, and herbal medicines. Speak with your doctor or pharmacist before starting or stopping any medicine.  If your symptoms do not improve or they worsen while on this medicine, contact your doctor.  Cautions  This medicine contains an opioid. Though it helps many people, this medicine may sometimes cause addiction, especially if it is used for a long time. This risk for addiction may be higher if  you have a substance use disorder - such as overuse of or addiction to drugs or alcohol. Speak with your doctor about the benefits and risks of using this medicine.  Ask your doctor or pharmacist if you should have naloxone on hand to treat opioid overdose. Teach your family or household members about the signs of an opioid overdose and how to treat it.  If you stop this medicine suddenly, after using it regularly for a long time, you may have withdrawal symptoms. Your doctor may ask you to slowly reduce your dose before stopping it. Tell your doctor right away if you notice any symptoms of withdrawal. Withdrawal symptoms can include unusual sweating, watering eyes, runny nose, chills, stomach pain, diarrhea, yawning, muscle aches, irritability, restlessness, anxiety, trouble sleeping, or thoughts of suicide.  Tell your doctor and pharmacist if you ever had an allergic reaction to a medicine. Symptoms of an allergic reaction can include trouble breathing, skin rash, itching, swelling, or severe dizziness.  Do not use the medication any more than instructed.  This medicine may cause dizziness or fainting, especially after exercising or in hot weather.  Be very careful when standing or sitting up quickly.  If possible, avoid using with marijuana or other medicines that can cause dizziness or drowsiness. These include allergy/cold products, muscle relaxers, sleep aids, and pain relievers.  Your ability to stay alert or to react quickly may be impaired by this medicine. Do not drive or operate machinery until you know how this medicine will affect you.  Do not drink beverages with alcohol while on this medicine.  This medicine passes into breast milk. Ask your doctor before breastfeeding.  This medicine can hurt a new baby in the womb. If you become pregnant while on this medicine, tell your doctor immediately. Your doctor may switch you to a different medicine.  This medicine should be used with caution in patients with  breathing difficulties.  Call your doctor right away if you notice slow or shallow breathing.  Do not share this medicine with anyone who has not been prescribed this medicine.  This medicine can cause serious side effects in some patients. Important information from the U.S. Food and Drug Administration (FDA) is available from your pharmacist. Please review it carefully with your pharmacist to understand the risks associated with this medicine.  Side Effects  The following is a list of some common side effects from this medicine. Please speak with your doctor about what you should do if you experience these or other side effects.  decreased appetite  constipation  dizziness  drowsiness or sedation  lightheadedness  nausea  vomiting  If you have any of the following side effects, you may be getting too much medicine. Please contact your doctor to let them know about these side effects.  confusion  fainting  unusual or unexplained tiredness or weakness  difficulty or discomfort urinating  Call your doctor or get medical help right away if you notice any of these more serious side effects:  agitated feeling or trouble sleeping  decreased awareness or responsiveness  breathing interruption during sleep  shallow, irregular breathing  hallucinations (unusual thoughts, seeing or hearing things that are not real)  seizures  severe stomach or bowel pain  weight loss  A few people may have an allergic reaction to this medicine. Symptoms can include difficulty breathing, skin rash, itching, swelling, or severe dizziness. If you notice any of these symptoms, seek medical help quickly.  Extra  Please speak with your doctor, nurse, or pharmacist if you have any questions about this medicine.  https://api.meducation.com/[email or web address]/fdbpem/[email or web address] NOTE: This document tells you briefly how to take your medicine, but it does not tell you all there is to know about it. Your doctor or pharmacist may give you other documents about your  medicine. Please talk to them if you have any questions. Always follow their advice. There is a more complete description of this medicine available in [place]. Scan this code on your smartphone or tablet or use the web address below. You can also ask your pharmacist for a printout. If you have any questions, please ask your pharmacist. The display and use of this drug information is subject to Terms of Use. Copyright(c) 2022 [employer]    [phone] The [place]. All rights reserved. This information is not intended as a substitute for professional medical care. Always follow your healthcare professional's instructions.        Discharge Instructions for Hyperkalemia  You have been diagnosed with hyperkalemia. This means you have a high level of potassium in your blood. Potassium is important to the function of  the nerve and muscle cells. This includes the cells of the heart. But a high level of potassium in the blood causes serious problems. These include abnormal heart rhythms and even heart attack.   Diet changes  Eat less of these potassium-rich foods:   Bananas  Apricots, fresh or dried  Oranges and orange juice  Grapefruit juice  Tomatoes, tomato sauce, and tomato juice  Spinach  Green, leafy vegetables, including salad greens, kale, broccoli, chard, and collards  Melons of all kinds  Peas  Beans  Potatoes  Sweet potatoes  Avocados and guacamole  Vegetable juice (homemade or store-bought) and vegetable juice cocktail  Fruit juices  Nuts, including pistachios, almonds, peanuts, hazelnuts, [place] nuts, cashews, or mixed nuts  "Lite" or reduced sodium salt  Other home care  Tell your healthcare provider about all prescription and over-the-counter medicines. Also tell them about herbal or dietary supplements you are taking. Certain medicines and supplements can increase potassium levels.  Take all medicines exactly as directed.  Have your potassium levels checked regularly.  Keep all  follow-up appointments. Your healthcare provider needs to monitor your condition closely.  Learn to take your own pulse. If your pulse is less than 60 beats per minute, greater than 100 beats per minute, or irregular, call your provider.    Follow-up  Follow up with your healthcare provider, or as advised.   When to call your healthcare provider  Call your healthcare provider right away if you have any of the following:   Slow, irregular heartbeat  Fatigue  Dizziness  Lightheadedness  Confusion  Weakness  Call 911  Call 911 if you have any of these:   Chest pain  Fainting  Shortness of breath  StayWell last reviewed this educational content on 07/23/2020   2000-2022 The [employer]. All rights reserved. This information is not intended as a substitute for professional medical care. Always follow your healthcare professional's instructions.        Hemodialysis  Hemodialysis is a type of treatment for kidney failure (end-stage kidney disease or ESRD). It uses a machine that holds a filter called a dialyzer. As blood flows through the dialyzer, waste is removed and fluid and chemicals are balanced. Hemodialysis treatments are usually done at a special dialysis center. In some cases, treatments may be done at home. As the kidney failure is getting worse, your doctor may advise you to have an access placed by a surgeon into one of your arms ahead of time. This access may take several weeks to mature before it can be used for hemodialysis.   How hemodialysis is done  Two needles are inserted into a blood vessel (called an arteriovenous fistula or AV fistula) or arteriovenous graft (or AV graft), usually in your arm. Each needle is attached to a tube. One tube carries your blood into the dialyzer, where it's cleaned. Clean blood returns to your body through a second tube and needle. If the AV fistula or graft has been placed but it's not ready to use or if options for permanent access aren't appropriate, you may  have a dialysis catheter placed in a large blood vessel. The catheter is usually for temporary access. But if there is no other option, it may be used long term (permanently). This catheter helps carry blood to and from the dialysis machine.   Your experience: Problems to watch for  Hemodialysis usually takes about 3 to 5 hours. It's usually done 3 times a week.  You'll have  a regular schedule for your hemodialysis. Many centers have evening and weekend hours as well as weekday hours to help you continue working. Some centers also offer overnight treatments.  A trained nurse or technician connects you to the dialysis machine. He or she watches for problems and makes sure you are comfortable.  During treatment, only a small amount of blood (about 1 cup) is out of your body at any one time.  During your treatments, you may have a headache, muscle cramps, nausea and vomiting, chest and back pain, itching, and fever and chills. Make sure you tell your nurse or technician if you have any of these symptoms.  Some people are able to learn to use a dialysis at home. Home dialysis lets you schedule treatments when it's most convenient. You may have more frequent treatments, but for shorter periods of time. You may also do overnight treatments.  Get immediate medical help or call your doctor, nurse or dialysis technician if you have any of these symptoms after treatment:   Chest or back pain  Tiredness (fatigue)  Bleeding from the needle site, if your dialysis access is through an AV fistula or graft.  Drainage from the catheter exit site, if your dialysis access is through a catheter  Shortness of breath  Fever or chills  Headache or lightheadedness  Nausea or vomiting  Itching  Muscle cramps  Pain, warmth, or redness at your access site  Inability to feel your blood flow (called a thrill) in your AV fistula or graft, if your dialysis access is through an AV fistula or graft.  StayWell last reviewed this educational content  on 10/24/2019   2000-2022 The [place]. All rights reserved. This information is not intended as a substitute for professional medical care. Always follow your healthcare professional's instructions.

## 2021-03-06 NOTE — UM Notes (Signed)
CONCURRENT REVIEW March 06, 2021    PATIENT NAME: [name],[name]  DOB: 1955-04-13    History of present illness: Pt is a 66 y.o. female arrived to hospital on 03/03/2021 at 2336.    BP 165/68   Pulse 83   Temp 98.1 F (36.7 C) (Oral)   Resp 19   Ht 1.549 m (5\' 1" )   Wt 107.4 kg (236 lb 12.4 oz)   LMP  (LMP Unknown) Comment: post menopausal  SpO2 96%   BMI 44.74 kg/m     Temp:  [97.5 F (36.4 C)-98.1 F (36.7 C)]   Heart Rate:  [64-88]   Resp Rate:  [14-24]   BP: (111-165)/(55-76)   SpO2:  [79 %-98 %]     Last recorded pain score:  Pain Scale Used: Numeric Scale (0-10)  Pain Score: 7-severe pain       Abnormal Labs:   Lab Results last 48 Hours       Procedure Component Value Units Date/Time    Glucose Whole Blood - POCT [[id number]]  (Abnormal) Collected: 03/06/21 0745     Updated: 03/06/21 0751     Whole Blood Glucose POCT 278 mg/dL     CBC and differential [[id number]]  (Abnormal) Collected: 03/06/21 0348     Hgb 10.4 g/dL      Hematocrit 33.6 %      Platelets 86 x10 3/uL      RBC 3.53 x10 6/uL      MCHC 31.0 g/dL     Basic Metabolic Panel [[id number]]  (Abnormal) Collected: 03/06/21 0348    Specimen: Blood Updated: 03/06/21 0447     Glucose 360 mg/dL      BUN 36.0 mg/dL      Creatinine 5.4 mg/dL      Calcium 7.6 mg/dL     GFR [[id number]] Collected: 03/06/21 0348     Updated: 03/06/21 0447     EGFR 9.6    Glucose Whole Blood - POCT [[id number]]  (Abnormal) Collected: 03/05/21 2114     Updated: 03/05/21 2118     Whole Blood Glucose POCT 283 mg/dL     Glucose Whole Blood - POCT [[id number]]  (Abnormal) Collected: 03/05/21 1555     Updated: 03/05/21 1558     Whole Blood Glucose POCT 288 mg/dL     Glucose Whole Blood - POCT [[id number]]  (Abnormal) Collected: 03/05/21 1200     Updated: 03/05/21 1228     Whole Blood Glucose POCT 178 mg/dL     Basic Metabolic Panel [[id number]]  (Abnormal) Collected: 03/05/21 0803    Specimen: Blood Updated: 03/05/21 0830     Glucose 318 mg/dL      BUN 65.0 mg/dL       Creatinine 8.4 mg/dL      Calcium 7.5 mg/dL     GFR [[id number]] Collected: 03/05/21 0803     Updated: 03/05/21 0830     EGFR 5.8    CBC and differential [[id number]]  (Abnormal) Collected: 03/05/21 0803     Hgb 10.9 g/dL      Platelets 96 x10 3/uL      RBC 3.74 x10 6/uL      MCHC 31.2 g/dL     Glucose Whole Blood - POCT [[id number]]  (Abnormal) Collected: 03/05/21 0735     Updated: 03/05/21 0808     Whole Blood Glucose POCT 281 mg/dL     Glucose Whole Blood - POCT [[id number]]  (Abnormal) Collected: 03/04/21 2200     Updated: 03/04/21 2218  Whole Blood Glucose POCT 206 mg/dL     Glucose Whole Blood - POCT [[id number]]  (Abnormal) Collected: 03/04/21 1538     Updated: 03/04/21 1551     Whole Blood Glucose POCT 300 mg/dL     Basic Metabolic Panel [[id number]]  (Abnormal) Collected: 03/04/21 1413    Specimen: Blood Updated: 03/04/21 1507     Glucose 343 mg/dL      BUN 55.0 mg/dL      Creatinine 7.6 mg/dL      Calcium 7.8 mg/dL     GFR [[id number]] Collected: 03/04/21 1413     Updated: 03/04/21 1507     EGFR 6.5    Glucose Whole Blood - POCT [[id number]]  (Abnormal) Collected: 03/04/21 1142     Updated: 03/04/21 1152     Whole Blood Glucose POCT 357 mg/dL        Diagnostics:  No results found.    MD Notes:  Podiatry 03/06/2021  Assessment: [name] is a 66 y.o. female nail dystrophy           Plan:    -. Pt S & E. Bilateral toe dried blister debrided with #15 blade. Healed skin underneath. For nails will follow up outpatient.   -. WBAT    Date of Admission:   03/03/2021  Date of Discharge:   03/06/2021  Hospital Course:   Details of admission and consultations on record.  [name] is a 66 y.o. female with history of end-stage renal disease who had missed dialysis has history of diabetes hypertension hyperlipidemia COPD obstructive sleep apnea deconditioning, noncompliance with medication who presented to emergency room with complaints of shortness of breath for 1 week patient had missed dialysis for almost 1  week patient had hyperkalemia elevated blood pressure blood sugar and was admitted to intensive care unit for urgent dialysis and immediate care in  higher level in hospital.  Patient had dialysis fluid balance was improved oxygenation was improved hyperkalemia was improved she was resumed on home medication and was given as needed medicines for elevated blood pressure.  She was started on sliding scale coverage, Lantus.  She was initially monitored in ICU subsequently transferred to medical floor patient had been having her dialysis per schedule.  Her fluid overloading improved oxygenation improved currently she is off oxygen electrolytes got corrected.  Blood pressure and blood sugars improved she will be resumed on home dose of long-acting insulin.  She had noncompliance with medication, advised to comply with dialysis as well as with medication to prevent future hospitalization.  She had poor nail hygiene, big toe with black color.  Patient had previously a blister on the toe patient was seen by podiatry, recommended , outpatient follow-up with podiatry, she had bilateral toe dry blister debrided skin was healed underneath.  She is cleared by nephrology for discharge with recommendations to continue dialysis per schedule as an outpatient.  Called dtr msg left , called husband multiple , eventually msg left By Lieutenant Diego ( I was there too ) for DCP /  Further hospital course per daily Notes .    Nephrology 03/06/2021  CC: follow-up ESRD   Assessment:  1. ESRD on HD MWF at [facility] - followed by Dr. Randel Pigg  - Missed HD on 2/6, 2/8 due to not feeling well   - Last HD 1 week ago   2. Accelerated hypertension due to volume overload in the setting of missed HD  3. Volume overload - UF as needed  4. Anemia in CKD - Hgb on goal   5. Hyperphosphatemia - monitor , on sevelamer   6. Secondary hyperparathyroidism - monitor   Recommendations:  - HD tomorrow at [facility]   - Stable from renal stand point for  discharge       Medications: Scheduled Meds:  Current Facility-Administered Medications   Medication Dose Route Frequency    amLODIPine  10 mg Oral Daily    aspirin  81 mg Oral Daily    atorvastatin  40 mg Oral Daily    calcium GLUConate  1 g Intravenous Once    carvedilol  12.5 mg Oral Q12H [place]    gabapentin  100 mg Oral Q8H [place]    heparin (porcine)  5,000 Units Subcutaneous Q8H [place]    insulin glargine  20 Units Subcutaneous Daily    insulin lispro  1-4 Units Subcutaneous QHS    insulin lispro  1-8 Units Subcutaneous TID AC    pantoprazole  40 mg Oral QAM AC    petrolatum   Topical Q12H    senna-docusate  1 tablet Oral Q12H [place]    sodium bicarbonate  50 mEq Intravenous Once     Continuous Infusions:  PRN Meds:.acetaminophen, Nursing communication: Adult Hypoglycemia Treatment Algorithm **AND** dextrose **AND** dextrose **AND** dextrose **AND** glucagon (rDNA), Nursing communication: Adult Hypoglycemia Treatment Algorithm **AND** dextrose **AND** dextrose **AND** dextrose **AND** glucagon (rDNA), hydrALAZINE, oxyCODONE      This clinical review is based on compiled documentation provided by the treatment team within the patient's medical record.      UTILIZATION REVIEW CONTACT :   Regis Bill, MSN, RN, ACM  Utilization Review Case Manager ll   Phone: [phone] (vm only)  Main Line: [phone]  Fax Line [phone]  Cell [phone] (8a-5p)  [email or web address] [email or web address]   ([place]) [facility]   ([place]) [facility]   [facility]) [facility]   [facility]) [facility]  [facility])   [place].  [place], [place] [address] [place]  [place], [place] [address] [address]  [place], [place] [address] [phone] [facility].  [place], [place] [address] [address]  [place], [place] [address]   NPI: [id number] NPI: [id number] NPI: [id number] NPI: [id number]: [id number]

## 2021-03-06 NOTE — Plan of Care (Signed)
NURSING SHIFT NOTE     Patient: [name]  Day: 0      SHIFT EVENTS     Shift Narrative/Significant Events (PRN med administration, fall, RRT, etc.):     Pt is A&O X4. RA. Tele in place showing NSR. No c/o pain, sob or distress noted. Fistula  in left arm WNL. Wound care done. VSS. Call bell within reach.    Safety and fall precautions remain in place. Purposeful rounding completed.          ASSESSMENT     Changes in assessment from patient's baseline this shift:    Neuro: No  CV: No  Pulm: No  Peripheral Vascular: No  HEENT: No  GI: No  BM during shift: No, Last BM: Last BM Date: 03/05/21  GU: No   Integ: No  MS: No    Pain: None  Pain Interventions: None  Medications Utilized: n/a    Mobility: PMP Activity: Step 3 - Bed Mobility of Distance Walked (ft) (Step 6,7): 0 Feet           Lines     Patient Lines/Drains/Airways Status       Active Lines, Drains and Airways       Name Placement date Placement time Site Days    Peripheral IV 03/04/21 20 G Anterior;Distal;Right Forearm 03/04/21  --  Forearm  2    External Urinary Catheter 11/12/20  1330  --  113    Graft/Fistula Left --  --  -- --                         VITAL SIGNS     Vitals:    03/06/21 0350   BP:    Pulse: 65   Resp:    Temp:    SpO2: 93%       Temp  Min: 97.3 F (36.3 C)  Max: 98 F (36.7 C)  Pulse  Min: 64  Max: 88  Resp  Min: 14  Max: 42  BP  Min: 111/66  Max: 174/79  SpO2  Min: 79 %  Max: 98 %      Intake/Output Summary (Last 24 hours) at 03/06/2021 0504  Last data filed at 03/05/2021 1700  Gross per 24 hour   Intake 120 ml   Output 3500 ml   Net -3380 ml            CARE PLAN        Problem: Compromised Tissue integrity  Goal: Damaged tissue is healing and protected  Outcome: Progressing  Flowsheets (Taken 03/05/2021 0800 by Bjorn Pippin, Lysbeth Penner, RN)  Damaged tissue is healing and protected:   Keep intact skin clean and dry   Avoid shearing injuries   Relieve pressure to bony prominences for patients at moderate and high risk   Increase  activity as tolerated/progressive mobility   Reposition patient every 2 hours and as needed unless able to reposition self   Provide wound care per wound care algorithm   Monitor/assess Braden scale every shift   Use bath wipes, not soap and water, for daily bathing   Monitor patient's hygiene practices   Utilize specialty bed     Problem: Fluid and Electrolyte Imbalance/ Endocrine  Goal: Fluid and electrolyte balance are achieved/maintained  Outcome: Progressing     Problem: Nutrition  Goal: Nutritional intake is adequate  Outcome: Progressing     Problem: Pain interferes with ability to perform ADL  Goal: Pain at adequate level as identified by patient  Outcome: Progressing

## 2021-04-13 ENCOUNTER — Emergency Department: Payer: 59

## 2021-04-13 ENCOUNTER — Observation Stay
Admission: EM | Admit: 2021-04-13 | Discharge: 2021-04-19 | Disposition: A | Discharge status: Home / Self Care | Payer: 59 | Attending: Internal Medicine | Admitting: Internal Medicine

## 2021-04-13 DIAGNOSIS — J90 Pleural effusion, not elsewhere classified: Secondary | ICD-10-CM | POA: Insufficient documentation

## 2021-04-13 DIAGNOSIS — E875 Hyperkalemia: Secondary | ICD-10-CM | POA: Insufficient documentation

## 2021-04-13 DIAGNOSIS — Z9981 Dependence on supplemental oxygen: Secondary | ICD-10-CM | POA: Insufficient documentation

## 2021-04-13 DIAGNOSIS — N2581 Secondary hyperparathyroidism of renal origin: Secondary | ICD-10-CM | POA: Insufficient documentation

## 2021-04-13 DIAGNOSIS — R188 Other ascites: Secondary | ICD-10-CM | POA: Insufficient documentation

## 2021-04-13 DIAGNOSIS — G4733 Obstructive sleep apnea (adult) (pediatric): Secondary | ICD-10-CM | POA: Insufficient documentation

## 2021-04-13 DIAGNOSIS — Z992 Dependence on renal dialysis: Secondary | ICD-10-CM | POA: Insufficient documentation

## 2021-04-13 DIAGNOSIS — Z7982 Long term (current) use of aspirin: Secondary | ICD-10-CM | POA: Insufficient documentation

## 2021-04-13 DIAGNOSIS — K449 Diaphragmatic hernia without obstruction or gangrene: Secondary | ICD-10-CM | POA: Insufficient documentation

## 2021-04-13 DIAGNOSIS — A4902 Methicillin resistant Staphylococcus aureus infection, unspecified site: Secondary | ICD-10-CM | POA: Insufficient documentation

## 2021-04-13 DIAGNOSIS — K429 Umbilical hernia without obstruction or gangrene: Secondary | ICD-10-CM | POA: Insufficient documentation

## 2021-04-13 DIAGNOSIS — L02211 Cutaneous abscess of abdominal wall: Principal | ICD-10-CM | POA: Insufficient documentation

## 2021-04-13 DIAGNOSIS — N186 End stage renal disease: Secondary | ICD-10-CM | POA: Insufficient documentation

## 2021-04-13 DIAGNOSIS — K8020 Calculus of gallbladder without cholecystitis without obstruction: Secondary | ICD-10-CM | POA: Insufficient documentation

## 2021-04-13 DIAGNOSIS — D696 Thrombocytopenia, unspecified: Secondary | ICD-10-CM | POA: Insufficient documentation

## 2021-04-13 DIAGNOSIS — E8770 Fluid overload, unspecified: Secondary | ICD-10-CM | POA: Insufficient documentation

## 2021-04-13 DIAGNOSIS — E1122 Type 2 diabetes mellitus with diabetic chronic kidney disease: Secondary | ICD-10-CM | POA: Insufficient documentation

## 2021-04-13 DIAGNOSIS — Z20822 Contact with and (suspected) exposure to covid-19: Secondary | ICD-10-CM | POA: Insufficient documentation

## 2021-04-13 DIAGNOSIS — Z794 Long term (current) use of insulin: Secondary | ICD-10-CM | POA: Insufficient documentation

## 2021-04-13 DIAGNOSIS — D259 Leiomyoma of uterus, unspecified: Secondary | ICD-10-CM | POA: Insufficient documentation

## 2021-04-13 DIAGNOSIS — E1165 Type 2 diabetes mellitus with hyperglycemia: Secondary | ICD-10-CM | POA: Insufficient documentation

## 2021-04-13 DIAGNOSIS — E785 Hyperlipidemia, unspecified: Secondary | ICD-10-CM | POA: Insufficient documentation

## 2021-04-13 DIAGNOSIS — I120 Hypertensive chronic kidney disease with stage 5 chronic kidney disease or end stage renal disease: Secondary | ICD-10-CM | POA: Insufficient documentation

## 2021-04-13 DIAGNOSIS — J811 Chronic pulmonary edema: Secondary | ICD-10-CM | POA: Insufficient documentation

## 2021-04-13 DIAGNOSIS — Z79899 Other long term (current) drug therapy: Secondary | ICD-10-CM | POA: Insufficient documentation

## 2021-04-13 DIAGNOSIS — D631 Anemia in chronic kidney disease: Secondary | ICD-10-CM | POA: Insufficient documentation

## 2021-04-13 DIAGNOSIS — I517 Cardiomegaly: Secondary | ICD-10-CM | POA: Insufficient documentation

## 2021-04-13 DIAGNOSIS — J9610 Chronic respiratory failure, unspecified whether with hypoxia or hypercapnia: Secondary | ICD-10-CM | POA: Insufficient documentation

## 2021-04-13 DIAGNOSIS — R109 Unspecified abdominal pain: Secondary | ICD-10-CM

## 2021-04-13 DIAGNOSIS — E46 Unspecified protein-calorie malnutrition: Secondary | ICD-10-CM | POA: Insufficient documentation

## 2021-04-13 DIAGNOSIS — E872 Acidosis, unspecified: Secondary | ICD-10-CM | POA: Insufficient documentation

## 2021-04-13 DIAGNOSIS — E1142 Type 2 diabetes mellitus with diabetic polyneuropathy: Secondary | ICD-10-CM | POA: Insufficient documentation

## 2021-04-13 DIAGNOSIS — Z6841 Body Mass Index (BMI) 40.0 and over, adult: Secondary | ICD-10-CM | POA: Insufficient documentation

## 2021-04-13 DIAGNOSIS — J449 Chronic obstructive pulmonary disease, unspecified: Secondary | ICD-10-CM | POA: Insufficient documentation

## 2021-04-13 LAB — HEPATITIS B SURFACE ANTIBODY: HEPATITIS B SURFACE ANTIBODY: <3.31 m[IU]/mL

## 2021-04-13 LAB — CBC AND DIFFERENTIAL
Absolute NRBC: 0 10*3/uL (ref 0.00–0.00)
Basophils Absolute Automated: 0.03 10*3/uL (ref 0.00–0.08)
Basophils Automated: 0.6 %
Eosinophils Absolute Automated: 0.03 10*3/uL (ref 0.00–0.44)
Eosinophils Automated: 0.6 %
Hematocrit: 38.7 % (ref 34.7–43.7)
Hgb: 12.2 g/dL (ref 11.4–14.8)
Immature Granulocytes Absolute: 0.03 10*3/uL (ref 0.00–0.07)
Immature Granulocytes: 0.6 %
Instrument Absolute Neutrophil Count: 3.68 10*3/uL (ref 1.10–6.33)
Lymphocytes Absolute Automated: 0.74 10*3/uL (ref 0.42–3.22)
Lymphocytes Automated: 14.6 %
MCH: 28.4 pg (ref 25.1–33.5)
MCHC: 31.5 g/dL (ref 31.5–35.8)
MCV: 90 fL (ref 78.0–96.0)
MPV: 13.7 fL — ABNORMAL HIGH (ref 8.9–12.5)
Monocytes Absolute Automated: 0.55 10*3/uL (ref 0.21–0.85)
Monocytes: 10.9 %
Neutrophils Absolute: 3.68 10*3/uL (ref 1.10–6.33)
Neutrophils: 72.7 %
Nucleated RBC: 0 /100 WBC (ref 0.0–0.0)
Platelets: 90 10*3/uL — ABNORMAL LOW (ref 142–346)
RBC: 4.3 10*6/uL (ref 3.90–5.10)
RDW: 14 % (ref 11–15)
WBC: 5.06 10*3/uL (ref 3.10–9.50)

## 2021-04-13 LAB — PHOSPHORUS: Phosphorus: 6.8 mg/dL — ABNORMAL HIGH (ref 2.3–4.7)

## 2021-04-13 LAB — COMPREHENSIVE METABOLIC PANEL
ALT: 23 U/L (ref 0–55)
AST (SGOT): 18 U/L (ref 5–41)
Albumin/Globulin Ratio: 0.7 — ABNORMAL LOW (ref 0.9–2.2)
Albumin: 2.5 g/dL — ABNORMAL LOW (ref 3.5–5.0)
Alkaline Phosphatase: 229 U/L — ABNORMAL HIGH (ref 37–117)
Anion Gap: 16 — ABNORMAL HIGH (ref 5.0–15.0)
BUN: 69 mg/dL — ABNORMAL HIGH (ref 7.0–21.0)
Bilirubin, Total: 0.6 mg/dL (ref 0.2–1.2)
CO2: 19 mEq/L (ref 17–29)
Calcium: 8 mg/dL — ABNORMAL LOW (ref 8.5–10.5)
Chloride: 99 mEq/L (ref 99–111)
Creatinine: 10.2 mg/dL — ABNORMAL HIGH (ref 0.4–1.0)
Globulin: 3.4 g/dL (ref 2.0–3.6)
Glucose: 523 mg/dL (ref 70–100)
Potassium: 4.2 mEq/L (ref 3.5–5.3)
Protein, Total: 5.9 g/dL — ABNORMAL LOW (ref 6.0–8.3)
Sodium: 134 mEq/L — ABNORMAL LOW (ref 135–145)

## 2021-04-13 LAB — BLOOD GAS, VENOUS
Base Excess, Ven: -4.4 mEq/L
HCO3, Ven: 22.4 mEq/L
O2 Sat, Venous: 84.9 %
Temperature: 37
Venous Total CO2: 46.8 mEq/L
pCO2, Venous: 51 mmhg
pH, Ven: 7.266
pO2, Venous: 60.4 mmhg

## 2021-04-13 LAB — LIPASE: Lipase: 43 U/L (ref 8–78)

## 2021-04-13 LAB — COVID-19 (SARS-COV-2) & INFLUENZA  A/B, NAA (ROCHE LIAT)
Influenza A: NOT DETECTED
Influenza B: NOT DETECTED
SARS CoV 2 Overall Result: NOT DETECTED

## 2021-04-13 LAB — HEPATITIS B SURFACE ANTIGEN W/ REFLEX TO CONFIRMATION: Hepatitis B Surface Antigen: NONREACTIVE

## 2021-04-13 LAB — GLUCOSE WHOLE BLOOD - POCT
Whole Blood Glucose POCT: 452 mg/dL — ABNORMAL HIGH (ref 70–100)
Whole Blood Glucose POCT: 295 mg/dL — ABNORMAL HIGH (ref 70–100)
Whole Blood Glucose POCT: 296 mg/dL — ABNORMAL HIGH (ref 70–100)

## 2021-04-13 LAB — HEPATITIS C ANTIBODY: Hepatitis C, AB: NONREACTIVE

## 2021-04-13 LAB — LACTIC ACID, PLASMA: Lactic Acid: 1.4 mmol/L (ref 0.2–2.0)

## 2021-04-13 LAB — GFR: EGFR: 4.6

## 2021-04-13 MED ORDER — DIPHENHYDRAMINE HCL 50 MG/ML IJ SOLN
6.2500 mg | Freq: Four times a day (QID) | INTRAMUSCULAR | Status: DC | PRN
Start: 2021-04-13 — End: 2021-04-19
  Administered 2021-04-13 – 2021-04-18 (×2): 6.25 mg via INTRAVENOUS
  Filled 2021-04-13 (×2): qty 1

## 2021-04-13 MED ORDER — INSULIN LISPRO 100 UNIT/ML SOLN (WRAP)
1.0000 [IU] | Freq: Three times a day (TID) | Status: DC
Start: 2021-04-13 — End: 2021-04-19
  Administered 2021-04-14: 17:00:00 8 [IU] via SUBCUTANEOUS
  Administered 2021-04-15 – 2021-04-16 (×2): 1 [IU] via SUBCUTANEOUS
  Administered 2021-04-16: 3 [IU] via SUBCUTANEOUS
  Administered 2021-04-17 (×2): 5 [IU] via SUBCUTANEOUS
  Administered 2021-04-17: 7 [IU] via SUBCUTANEOUS
  Administered 2021-04-18: 3 [IU] via SUBCUTANEOUS
  Administered 2021-04-18: 1 [IU] via SUBCUTANEOUS
  Administered 2021-04-19: 7 [IU] via SUBCUTANEOUS
  Administered 2021-04-19: 5 [IU] via SUBCUTANEOUS
  Administered 2021-04-19: 8 [IU] via SUBCUTANEOUS
  Filled 2021-04-13: qty 9
  Filled 2021-04-13: qty 15
  Filled 2021-04-13: qty 3
  Filled 2021-04-13: qty 15
  Filled 2021-04-13 (×2): qty 3
  Filled 2021-04-13: qty 24
  Filled 2021-04-13: qty 15
  Filled 2021-04-13: qty 24
  Filled 2021-04-13: qty 3
  Filled 2021-04-13: qty 21
  Filled 2021-04-13: qty 24
  Filled 2021-04-13: qty 9

## 2021-04-13 MED ORDER — ATORVASTATIN CALCIUM 40 MG PO TABS
40.0000 mg | ORAL_TABLET | Freq: Every day | ORAL | Status: DC
Start: 2021-04-14 — End: 2021-04-19
  Administered 2021-04-14 – 2021-04-19 (×6): 40 mg via ORAL
  Filled 2021-04-13 (×6): qty 1

## 2021-04-13 MED ORDER — CEFEPIME HCL 2 G IJ SOLR
2.0000 g | Freq: Once | INTRAMUSCULAR | Status: DC
Start: 2021-04-13 — End: 2021-04-13

## 2021-04-13 MED ORDER — METRONIDAZOLE IN NACL 500 MG/100 ML IV SOLN (WRAP)
500.0000 mg | Freq: Once | INTRAVENOUS | Status: DC
Start: 2021-04-13 — End: 2021-04-13

## 2021-04-13 MED ORDER — INSULIN LISPRO 100 UNIT/ML SOLN (WRAP)
1.0000 [IU] | Freq: Every evening | Status: DC
Start: 2021-04-13 — End: 2021-04-19
  Administered 2021-04-13: 3 [IU] via SUBCUTANEOUS
  Administered 2021-04-14: 22:00:00 2 [IU] via SUBCUTANEOUS
  Administered 2021-04-15 – 2021-04-16 (×2): 4 [IU] via SUBCUTANEOUS
  Administered 2021-04-17 – 2021-04-18 (×2): 3 [IU] via SUBCUTANEOUS
  Filled 2021-04-13: qty 9
  Filled 2021-04-13: qty 12
  Filled 2021-04-13: qty 6
  Filled 2021-04-13: qty 3
  Filled 2021-04-13: qty 12
  Filled 2021-04-13: qty 9

## 2021-04-13 MED ORDER — PIPERACILLIN SOD-TAZOBACTAM SO 2.25 (2-0.25) G IV SOLR
2.2500 g | Freq: Three times a day (TID) | INTRAVENOUS | Status: DC
Start: 2021-04-14 — End: 2021-04-13

## 2021-04-13 MED ORDER — CARVEDILOL 25 MG PO TABS
25.0000 mg | ORAL_TABLET | Freq: Two times a day (BID) | ORAL | Status: DC
Start: 2021-04-13 — End: 2021-04-19
  Administered 2021-04-13 – 2021-04-19 (×10): 25 mg via ORAL
  Filled 2021-04-13 (×10): qty 1

## 2021-04-13 MED ORDER — DEXTROSE 50 % IV SOLN
12.5000 g | INTRAVENOUS | Status: DC | PRN
Start: 2021-04-13 — End: 2021-04-13

## 2021-04-13 MED ORDER — PANTOPRAZOLE SODIUM 40 MG PO TBEC
40.0000 mg | DELAYED_RELEASE_TABLET | Freq: Every morning | ORAL | Status: DC
Start: 2021-04-14 — End: 2021-04-19
  Administered 2021-04-14 – 2021-04-19 (×6): 40 mg via ORAL
  Filled 2021-04-13 (×6): qty 1

## 2021-04-13 MED ORDER — DEXTROMETHORPHAN-GUAIFENESIN ER 30-600 MG PO TB12
1.0000 | ORAL_TABLET | Freq: Two times a day (BID) | ORAL | Status: DC
Start: 2021-04-13 — End: 2021-04-19
  Administered 2021-04-13 – 2021-04-19 (×12): 1 via ORAL
  Filled 2021-04-13 (×12): qty 1

## 2021-04-13 MED ORDER — GLUCAGON 1 MG IJ SOLR (WRAP)
1.0000 mg | INTRAMUSCULAR | Status: DC | PRN
Start: 2021-04-13 — End: 2021-04-19

## 2021-04-13 MED ORDER — POTASSIUM & SODIUM PHOSPHATES 280-160-250 MG PO PACK
2.0000 | PACK | ORAL | Status: DC | PRN
Start: 2021-04-13 — End: 2021-04-13

## 2021-04-13 MED ORDER — GABAPENTIN 100 MG PO CAPS
100.0000 mg | ORAL_CAPSULE | Freq: Three times a day (TID) | ORAL | Status: DC
Start: 2021-04-13 — End: 2021-04-19
  Administered 2021-04-13 – 2021-04-19 (×17): 100 mg via ORAL
  Filled 2021-04-13 (×18): qty 1

## 2021-04-13 MED ORDER — INSULIN GLARGINE 100 UNIT/ML SC SOLN
25.0000 [IU] | Freq: Every evening | SUBCUTANEOUS | Status: DC
Start: 2021-04-13 — End: 2021-04-17
  Administered 2021-04-13 – 2021-04-16 (×4): 25 [IU] via SUBCUTANEOUS
  Filled 2021-04-13 (×4): qty 25

## 2021-04-13 MED ORDER — PIPERACILLIN SOD-TAZOBACTAM SO 2.25 (2-0.25) G IV SOLR
2.2500 g | Freq: Three times a day (TID) | INTRAVENOUS | Status: DC
Start: 2021-04-13 — End: 2021-04-13

## 2021-04-13 MED ORDER — VANCOMYCIN PHARMACY TO DOSE PLACEHOLDER
INTRAVENOUS | Status: DC
Start: 2021-04-13 — End: 2021-04-19

## 2021-04-13 MED ORDER — NALOXONE HCL 0.4 MG/ML IJ SOLN (WRAP)
0.2000 mg | INTRAMUSCULAR | Status: DC | PRN
Start: 2021-04-13 — End: 2021-04-19

## 2021-04-13 MED ORDER — MAGNESIUM SULFATE IN D5W 1-5 GM/100ML-% IV SOLN
1.0000 g | INTRAVENOUS | Status: DC | PRN
Start: 2021-04-13 — End: 2021-04-13

## 2021-04-13 MED ORDER — MELATONIN 3 MG PO TABS
3.0000 mg | ORAL_TABLET | Freq: Every evening | ORAL | Status: DC | PRN
Start: 2021-04-13 — End: 2021-04-19
  Administered 2021-04-18: 3 mg via ORAL
  Filled 2021-04-13: qty 1

## 2021-04-13 MED ORDER — DEXTROSE 50 % IV SOLN
12.5000 g | INTRAVENOUS | Status: DC | PRN
Start: 2021-04-13 — End: 2021-04-19

## 2021-04-13 MED ORDER — AQUAPHOR EX OINT
TOPICAL_OINTMENT | CUTANEOUS | Status: DC | PRN
Start: 2021-04-13 — End: 2021-04-19
  Filled 2021-04-13: qty 50

## 2021-04-13 MED ORDER — VANCOMYCIN HCL 1 G IV SOLR
2000.0000 mg | Freq: Once | INTRAVENOUS | Status: AC
Start: 2021-04-13 — End: 2021-04-13
  Administered 2021-04-13: 2000 mg via INTRAVENOUS
  Filled 2021-04-13: qty 2000

## 2021-04-13 MED ORDER — GLUCOSE 40 % PO GEL (WRAP)
15.0000 g | ORAL | Status: DC | PRN
Start: 2021-04-13 — End: 2021-04-19

## 2021-04-13 MED ORDER — MORPHINE SULFATE 2 MG/ML IJ/IV SOLN (WRAP)
2.0000 mg | Status: AC | PRN
Start: 2021-04-13 — End: 2021-04-15
  Administered 2021-04-13 – 2021-04-15 (×3): 2 mg via INTRAVENOUS
  Filled 2021-04-13 (×3): qty 1

## 2021-04-13 MED ORDER — POTASSIUM CHLORIDE 10 MEQ/100ML IV SOLN
10.0000 meq | INTRAVENOUS | Status: DC | PRN
Start: 2021-04-13 — End: 2021-04-13

## 2021-04-13 MED ORDER — DEXTROSE 10 % IV BOLUS
12.5000 g | INTRAVENOUS | Status: DC | PRN
Start: 2021-04-13 — End: 2021-04-19

## 2021-04-13 MED ORDER — GLUCOSE 40 % PO GEL (WRAP)
15.0000 g | ORAL | Status: DC | PRN
Start: 2021-04-13 — End: 2021-04-13

## 2021-04-13 MED ORDER — ASPIRIN 81 MG PO CHEW
81.0000 mg | CHEWABLE_TABLET | Freq: Every day | ORAL | Status: DC
Start: 2021-04-14 — End: 2021-04-19
  Administered 2021-04-14 – 2021-04-19 (×6): 81 mg via ORAL
  Filled 2021-04-13 (×6): qty 1

## 2021-04-13 MED ORDER — POTASSIUM CHLORIDE CRYS ER 20 MEQ PO TBCR
0.0000 meq | EXTENDED_RELEASE_TABLET | ORAL | Status: DC | PRN
Start: 2021-04-13 — End: 2021-04-13

## 2021-04-13 MED ORDER — ACETAMINOPHEN 500 MG PO TABS
500.0000 mg | ORAL_TABLET | Freq: Four times a day (QID) | ORAL | Status: DC | PRN
Start: 2021-04-13 — End: 2021-04-19

## 2021-04-13 MED ORDER — OXYCODONE-ACETAMINOPHEN 5-325 MG PO TABS
1.0000 | ORAL_TABLET | ORAL | Status: DC | PRN
Start: 2021-04-13 — End: 2021-04-19
  Administered 2021-04-15 – 2021-04-19 (×9): 1 via ORAL
  Filled 2021-04-13 (×9): qty 1

## 2021-04-13 MED ORDER — CEFEPIME HCL 1 G IJ SOLR
1.0000 g | Freq: Two times a day (BID) | INTRAMUSCULAR | Status: DC
Start: 2021-04-13 — End: 2021-04-13

## 2021-04-13 MED ORDER — HEPARIN SODIUM (PORCINE) 5000 UNIT/ML IJ SOLN
5000.0000 [IU] | Freq: Three times a day (TID) | INTRAMUSCULAR | Status: DC
Start: 2021-04-13 — End: 2021-04-13

## 2021-04-13 MED ORDER — GLUCAGON 1 MG IJ SOLR (WRAP)
1.0000 mg | INTRAMUSCULAR | Status: DC | PRN
Start: 2021-04-13 — End: 2021-04-13

## 2021-04-13 MED ORDER — INSULIN LISPRO 100 UNIT/ML SOLN (WRAP)
10.0000 [IU] | Freq: Once | Status: DC
Start: 2021-04-13 — End: 2021-04-19

## 2021-04-13 MED ORDER — SODIUM CHLORIDE 0.9 % IV BOLUS
250.0000 mL | INTRAVENOUS | Status: AC | PRN
Start: 2021-04-13 — End: 2021-04-13

## 2021-04-13 MED ORDER — ALBUTEROL SULFATE HFA 108 (90 BASE) MCG/ACT IN AERS
2.0000 | INHALATION_SPRAY | Freq: Four times a day (QID) | RESPIRATORY_TRACT | Status: DC | PRN
Start: 2021-04-13 — End: 2021-04-19
  Filled 2021-04-13: qty 8

## 2021-04-13 MED ORDER — PIPERACILLIN-TAZOBACTAM 2.25 GM MBP (CNR)
2.2500 g | Freq: Three times a day (TID) | INTRAVENOUS | Status: DC
Start: 2021-04-13 — End: 2021-04-15
  Administered 2021-04-13 – 2021-04-15 (×5): 2250 mg via INTRAVENOUS
  Filled 2021-04-13: qty 1
  Filled 2021-04-13: qty 2250
  Filled 2021-04-13 (×4): qty 1

## 2021-04-13 MED ORDER — SODIUM CHLORIDE 0.9 % IV BOLUS
100.0000 mL | INTRAVENOUS | Status: AC | PRN
Start: 2021-04-13 — End: 2021-04-13

## 2021-04-13 MED ORDER — INSULIN NPH (HUMAN) (ISOPHANE) 100 UNIT/ML SC SUSP
5.0000 [IU] | Freq: Three times a day (TID) | SUBCUTANEOUS | Status: DC
Start: 2021-04-13 — End: 2021-04-13

## 2021-04-13 MED ORDER — AMLODIPINE BESYLATE 5 MG PO TABS
10.0000 mg | ORAL_TABLET | Freq: Every day | ORAL | Status: DC
Start: 2021-04-14 — End: 2021-04-19
  Administered 2021-04-16 – 2021-04-19 (×4): 10 mg via ORAL
  Filled 2021-04-13 (×4): qty 2

## 2021-04-13 MED ORDER — SEVELAMER CARBONATE 800 MG PO TABS
800.0000 mg | ORAL_TABLET | Freq: Two times a day (BID) | ORAL | Status: DC
Start: 2021-04-14 — End: 2021-04-15
  Administered 2021-04-14 – 2021-04-15 (×3): 800 mg via ORAL
  Filled 2021-04-13 (×3): qty 1

## 2021-04-13 MED ORDER — DEXTROSE 10 % IV BOLUS
12.5000 g | INTRAVENOUS | Status: DC | PRN
Start: 2021-04-13 — End: 2021-04-13

## 2021-04-13 NOTE — ED Notes (Signed)
[place]  ED NURSING NOTE FOR THE RECEIVING INPATIENT NURSE   ED NURSE Charlcie Cradle, RN   [place]   ED CHARGE RN [phone]   ADMISSION INFORMATION   [name] is a 66 y.o. female admitted with a diagnosis of:    No diagnosis found.     Isolation: None   Allergies: Lisinopril and Metformin   Holding Orders confirmed? Will follow-up   Belongings Documented? Yes   Home medications sent to pharmacy confirmed? NA   NURSING CARE   Patient Comes From:   Mental Status: Home/Family Care  alert and oriented   ADL: Needs assistance with ADLs   Ambulation: ambulates with: walker   Pertinent Information  and Safety Concerns: Needs antibiotics after dialysis.  Last dialysis, 9 days ago.   Dialysis MWF     COVID Test sent to lab? Yes; pending results   VITAL SIGNS   Time BP Temp Pulse Resp SpO2   1604 166/69 98.2 75 16 99   CT / NIH   CT Head ordered on this patient?  No   NIH/Dysphagia assessment done prior to admission? No   PERSONAL PROTECTIVE EQUIPMENT   Face mask, gloves   LAB RESULTS   Labs Reviewed   CBC AND DIFFERENTIAL - Abnormal; Notable for the following components:       Result Value    Platelets 90 (*)     MPV 13.7 (*)     All other components within normal limits   COVID-19 (SARS-COV-2) & INFLUENZA  A/B, NAA (ROCHE LIAT)    Narrative:     o Collect and clearly label specimen type:  o PREFERRED-Upper respiratory specimen: One Nasal Swab in  Transport Media.  o Hand deliver to laboratory ASAP  Diagnostic -PUI   CULTURE BLOOD AEROBIC AND ANAEROBIC    Narrative:     1 BLUE+1 PURPLE   CULTURE BLOOD AEROBIC AND ANAEROBIC    Narrative:     1 BLUE+1 PURPLE   LACTIC ACID, PLASMA   BLOOD GAS, VENOUS   COMPREHENSIVE METABOLIC PANEL   LIPASE   URINALYSIS REFLEX TO MICROSCOPIC EXAM - REFLEX TO CULTURE   GFR   HEPATITIS B SURFACE ANTIGEN W/ REFLEX TO CONFIRMATION   HEPATITIS B SURFACE ANTIBODY   HEPATITIS C ANTIBODY   LACTIC ACID, PLASMA          Ticket to Ride Printed: yes

## 2021-04-13 NOTE — EDIE (Signed)
COLLECTIVE?NOTIFICATION?04/13/2021 13:22?[name], [name]?MRN: [id number]    Criteria Met      5 ED Visits in 12 Months    Security and Safety  No Security Events were found.  ED Care Guidelines  There are currently no ED Care Guidelines for this patient. Please check your facility's medical records system.        Prescription Monitoring Program  290??- Narcotic Use Score  240??- Sedative Use Score  000??- Stimulant Use Score  160??- Overdose Risk Score  - All Scores range from 000-999 with 75% of the population scoring < 200 and on 1% scoring above 650  - The last digit of the narcotic, sedative, and stimulant score indicates the number of active prescriptions of that type  - Higher Use scores correlate with increased prescribers, pharmacies, mg equiv, and overlapping prescriptions  - Higher Overdose Risk Scores correlate with increased risk of unintentional overdose death   Concerning or unexpectedly high scores should prompt a review of the PMP record; this does not constitute checking PMP for prescribing purposes.    E.D. Visit Count (12 mo.)  Facility Visits   [facility] 6   Total 6   Note: Visits indicate total known visits.     Recent Emergency Department Visit Summary  Date Facility [facility] Type Diagnoses or Chief Complaint    Apr 13, 2021  [place]  [place]  [place]  Emergency      Abdominal Pain      Mar 03, 2021  [place].  [facility]  [place]  Emergency      SOB x 1 week      Shortness of Breath      Hyperglycemia      Hypertension      Patient's noncompliance with other medical treatment and regimen due to unspecified reason      Shortness of breath      Other specified abnormalities of plasma proteins      Hyperkalemia      Dependence on renal dialysis      End stage renal disease      Feb 04, 2021  [place].  [facility]  [place]  Emergency      SOB      Difficulty breathing      Cough      Hypertensive emergency      Jan 05, 2021  [place]  [facility]  [place]   Emergency      Loss of appetite      Shortness of Breath      Diarrhea      Fluid overload, unspecified      Nov 12, 2020  [place].  [facility]  [place]  Emergency      "flu-like"sx,N,V,D x 3 days      Missed Dialysis      Flu like symptoms      End stage renal disease      Oct 20, 2020  [place].  [facility]  [place]  Emergency      hyperglycemia      Fall      Wrist Pain      Hip Pain      Shortness of breath        Recent Inpatient Visit Summary  Date [place] Type Diagnoses or Chief Complaint    Jan 05, 2021  [place].  [facility]  [place]  Medical Surgical  Fluid overload, unspecified      Nov 12, 2020  [place].  [facility]  [place]  Medical Surgical      End stage renal disease      Hyperkalemia      Myalgia, unspecified site      Diarrhea, unspecified      Vomiting, unspecified      Oct 20, 2020  [place].  [place]  [place]  Medical Surgical      Shortness of breath      End stage renal disease      Hypoxemia        Care Team  Provider Specialty Phone Fax Service Dates   Hazeline Junker Case Manager/Care Coordinator [phone]  Current    Theone Murdoch, DNP Nurse Practitioner: Family [phone] Current    PHAM, TRI M, MD Internal Medicine   Current      Collective Portal  This patient has registered at the [facility] Emergency Department   For more information visit: [email or web address]     PLEASE NOTE:     1.   Any care recommendations and other clinical information are provided as guidelines or for historical purposes only, and providers should exercise their own clinical judgment when providing care.    2.   You may only use this information for purposes of treatment, payment or health care operations activities, and subject to the limitations of applicable [employer] Policies.    3.   You should consult directly with the organization that provided a care guideline or other  clinical history with any questions about additional information or accuracy or completeness of information provided.    ? 2023 [employer] - [email or web address]

## 2021-04-13 NOTE — Consults (Addendum)
[facility]  CONSULT  [id number]      Date Time: 04/13/21 5:03 PM  Patient Name: [name]  Requesting Physician: Barbaraann Faster, MD  Consulting Physician: Gaylyn Lambert, MD, MD    Primary Care Physician: Horton Marshall, MD    Reason for Consultation: ESRD      Assessment:     Patient Active Problem List   Diagnosis    Iron deficiency anemia secondary to inadequate dietary iron intake    Sideropenic dysphagia    Peritoneal dialysis catheter dysfunction, initial encounter    Peritonitis    Upper GI bleed    ESRD (end stage renal disease) on dialysis    Generalized abdominal pain    Thrombocytopenia    h/o orthostatic hypotension    Type 2 diabetes mellitus, with long-term current use of insulin    GERD (gastroesophageal reflux disease)    Bowel incontinence    Right sided numbness    Uncontrolled type 2 diabetes mellitus with hyperglycemia    Hypertension    Hyperlipidemia    History of COPD    Gastritis with hemorrhage    History of anemia due to chronic kidney disease    Open wound, abdominal wall, lateral, subsequent encounter    Gout    Dizziness    Fall    History of CVA (cerebrovascular accident)    Pneumonia due to COVID-19 virus    Diarrhea    Hypertensive urgency    Shortness of breath    ESRD (end stage renal disease)    Hyperkalemia    Pulmonary edema    Hypervolemia    Elevated troponin    Chest pain    Nausea vomiting and diarrhea    Breast pain, left    Syncope    Hypertensive emergency    Volume overload       ESRD on HD MWF at [facility] - followed by Dr. 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 F/U blood cx   Dose medications for eGFR  Strict BG control <180        Barbaraann Faster, MD, thank you for this consultation.  We will follow the patient with you during this hospitalization.  Please contact me with any questions or issues.    Signed by: Gaylyn Lambert, MD  [facility]  [id number] (office)      -----------------------------------------------------------------------------------------------------------        History of Presenting Illness:     SADEEL FIDDLER is a 66 y.o. female with ESRD on HD at [facility], HTN, DM type 2 who was BIBA for abdominal wall bleeding. Pt has missed dialysis for more than a week, her last dialysis was 9 days ago  on Monday last week. In ER, she had a CT scan which showed diffuse anasarca, abdominal ascites and pulm edema. As well as abdominal wall cellulitis and formation of abscess at previous PD site. She was given iv vanco, cefepime, flagyl in ER. She was dyspneic and remains on 2L NC home oxygen. Labs showed elevated Cr, BUN and acidosis due to missed dialysis. Her blood sugars ~ 500s and blood pressure 197/91. Nephrology consulted for provision of hemodialysis.    Past Medical History:     Past Medical History:   Diagnosis Date    Chronic obstructive pulmonary disease     Diabetes mellitus     Hyperlipidemia     Hypertension     Sleep apnea 2018       Available old records reviewed, including:  labs, radiology, notes    Past Surgical History:     Past Surgical History:   Procedure Laterality Date    EGD, BIOPSY N/A 05/14/2019    Procedure: EGD, BIOPSY;  Surgeon: Ronie Spies, MD;  Location: [facility];  Service: Gastroenterology;  Laterality:  N/A;    EXPLORATORY LAPAROTOMY N/A 05/27/2019    Procedure: EXPLORATORY LAPAROTOMY;  Surgeon: Herbert Moors., MD;  Location: [facility];  Service: General;  Laterality: N/A;    JOINT REPLACEMENT      shoulder    JOINT REPLACEMENT      knee    OVARY SURGERY Left     Removal    REMOVAL, FOREIGN BODY N/A 05/27/2019    Procedure: REMOVAL, PERITONEAL DIALYSIS CATHETER;  Surgeon: Herbert Moors., MD;  Location: [facility];  Service: General;  Laterality: N/A;    TUNNELED CATH CHECK/CHANGE (PERMCATH) N/A 07/03/2019    Procedure: TUNNELED CATH CHECK/CHANGE;  Surgeon: Maureen Ralphs, MD;  Location: [facility];  Service: Interventional Radiology;  Laterality: N/A;    TUNNELED CATH PLACEMENT (PERMCATH) Bilateral 05/23/2019    Procedure: TUNNELED CATH PLACEMENT;  Surgeon: Jacques Earthly, MD;  Location: [facility];  Service: Interventional Radiology;  Laterality: Bilateral;    TUNNELED CATH PLACEMENT (PERMCATH) Right 07/31/2019    Procedure: TUNNELED CATH PLACEMENT;  Surgeon: Jacques Earthly, MD;  Location: [facility];  Service: Interventional Radiology;  Laterality: Right;       Family History:     Family History   Problem Relation Age of Onset    Diabetes Mother     Hypertension Mother     Diabetes Father     Hypertension Father     Diabetes Sister     Hypertension Sister     Diabetes Brother     Hypertension Brother     Diabetes Paternal Aunt     Diabetes Paternal Uncle        Social History:     Social History     Socioeconomic History    Marital status: Legally Separated     Spouse name: Not on file    Number of children: Not on file    Years of education: Not on file    Highest education level: Not on file   Occupational History    Not on file   Tobacco Use    Smoking status: Never    Smokeless tobacco: Never   Vaping Use    Vaping status: Never Used   Substance and Sexual Activity    Alcohol use: Never    Drug use: Never    Sexual activity: Not Currently   Other Topics Concern  Not on file    Social History Narrative    Not on file     Social Determinants of Health     Financial Resource Strain: Not on file   Food Insecurity: Not on file   Transportation Needs: Not on file   Physical Activity: Not on file   Stress: Not on file   Social Connections: Not on file   Intimate Partner Violence: Not on file   Housing Stability: Not on file       Allergies:     Allergies   Allergen Reactions    Lisinopril Hives    Metformin Hives       Medications:   (Not in a hospital admission)       Scheduled Meds: PRN Meds:    cefepime, 2 g, Intravenous, Once  insulin lispro, 10 Units, Subcutaneous, Once  metroNIDAZOLE, 500 mg, Intravenous, Once  vancomycin, 2,000 mg, Intravenous, Once          Continuous Infusions:   sodium chloride, 100 mL, Q1H PRN  sodium chloride, 250 mL, PRN            Review of Systems:   A comprehensive review of systems was per the HPI and below:     General ROS: no f/c, no weight changes  HEENT ROS: no blurry vision, no oral lesions, no epistaxis  Allergy/Immunology ROS: no new allergic reactions  Hematological and Lymphatic ROS: no known bleeding/clotting disorders  Respiratory ROS: negative for cough, shortness of breath, or wheezing  Cardiovascular ROS: negative for chest pain or dyspnea on exertion  Gastrointestinal ROS: + abd pain, no change in bowel habits  Genito-Urinary ROS: negative for dysuria, hematuria, difficulty voiding or nocturia  Musculoskeletal ROS:  negative for trauma or falls, arthralgias  Neurological ROS: no focal weakness, no dizziness  Endocrine ROS: no change in libido, no change in hair distribution  Dermatological ROS: no new rashes or lesions      Physical Exam:     Vitals:    04/13/21 1358 04/13/21 1604   BP: (!) 197/91 166/69   Pulse: 74 75   Resp: 20 16   Temp: 98.2 F (36.8 C)    TempSrc: Oral    SpO2: 93% 99%   Weight: 110.2 kg (243 lb)    Height: 1.575 m (5\' 2" )        Intake and Output Summary (Last 24 hours) at Date Time  No intake or output data in the 24  hours ending 04/13/21 1703    Recent weights:  Weight Monitoring 02/06/2021 02/07/2021 02/08/2021 03/04/2021 03/04/2021 03/05/2021 04/13/2021   Height - - - - 154.9 cm - 157.5 cm   Height Method - - - - - - Stated   Weight 99.6 kg 99.791 kg 98.567 kg 112.537 kg 111.4 kg 107.4 kg 110.224 kg   Weight Method Standing Scale Standing Scale Standing Scale Bed Scale Bed Scale Bed Scale Actual   BMI (calculated) - - - - 46.5 kg/m2 - 44.5 kg/m2       General: awake, alert, oriented x 3, no acute distress  HEENT: sclera anicteric, oropharynx clear without lesions, mucous membranes moist  Neck: supple, no lymphadenopathy, no thyromegaly, no JVD, no carotid bruits  Cardiovascular: regular rate and rhythm, no murmurs, rubs or gallops  Lungs: clear to auscultation bilaterally, without wheezing, rhonchi, or rales  Abdomen: soft, tender to touch, no guarding or rebound. PD site noted with erythema but no pus/discharge  Extremities: no clubbing, cyanosis, or edema  Neuro: A+O x 3, no gross motor/sensory deficits  Skin: no rashes or lesions noted      Labs:     Recent Labs   Lab 04/13/21  1526   WBC 5.06   Hgb 12.2   Hematocrit 38.7   Platelets 90*     Recent Labs   Lab 04/13/21  1526   Sodium 134*   Potassium 4.2   Chloride 99   CO2 19   BUN 69.0*   Creatinine 10.2*   Calcium 8.0*   Albumin 2.5*   Glucose 523*   EGFR 4.6             Invalid input(s): LEUKOCYTESUR        Imaging personally reviewed, including:       cc: Barbaraann Faster, MD  Horton Marshall, MD

## 2021-04-13 NOTE — Consults (Signed)
Full consult to follow    [name] Diane Ronelle Michie, MD

## 2021-04-13 NOTE — ED Notes (Signed)
Bed: GR6  Expected date:   Expected time:   Means of arrival:   Comments:

## 2021-04-13 NOTE — ED Triage Notes (Signed)
[name] is a 66 y.o. female, BIBA from home for abdominal bleeding noted yesterday. Hx of peritoneal dialysis, 2 years ago. Last dialysis, was 9 days ago. Dialysis MWF. Uses home O2 at 2lpm via [place]. No chest pain. No active bleeding noted at this time. Dexi 473.

## 2021-04-13 NOTE — Progress Notes (Signed)
Patient admitted and oriented to unit.      Four eyes skin assessment performed with [Farai, CT].  Refer to Epic assessment flowsheet for skin assessment.

## 2021-04-13 NOTE — H&P (Signed)
History and Physical Exam      Date Time: 04/13/21 6:15 PM  Patient Name: [name]  Attending Physician: Barbaraann Faster, MD      Assessment:   Abdominal wall abscess at PD catheter site.  End-stage renal disease on dialysis, missed dialysis for 9 days  Fluid overloading, dyspnea and hypoxia  Chronic respiratory failure on home oxygen/COPD  Thrombocytopenia  Diabetes mellitus  Hypertension  Hyperlipidemia  COPD  Obstructive sleep apnea  Full code    Plan:   Admit for inpatient medicine.  Follow cultures.  Received cefepime and vancomycin in Flagyl in ER, will start Zosyn.  Infectious diseases consultation.  Surgery consultation requested from ER.  Resume long-acting and short insulin, sliding scale coverage.  Resume amlodipine, carvedilol, aspirin, Lipitor.  Resume gabapentin.  Phosphorous binder   Symptomatic treatment  DVT prophylaxis, Hold Heparin   Discharge planning when stable  Labs and data reviewed.  Discussed with patient and nurse  Discussed with ER physician.  Nephrology consultation requested from ER, patient seen in dialysis center.  ID consultation requested.    Chief Complaint:   Drainage and bleeding from abdominal wall started early morning  History of Presenting Illness:   [name] is a 66 y.o. female   with history of end-stage renal disease used to be on peritoneal dialysis which was stopped about 2 years ago after patient had been noticed with infections at PD catheter site.  Patient is on hemodialysis she presented to emergency room with bleeding and drainage from the abdominal wall noted early morning.  She has been missing dialysis for 9 days stated that she was getting chickenpox which is healing although patient does not have any skin lesions.  She was complaining of shortness of breath patient is on home oxygen 2 L via nasal cannula.  She denies fever chills chest pain complain of dyspnea no cough no nausea vomiting abdominal pain.  She denies focal neurological  symptoms.  Work-up in emergency room showed elevated phosphorus 6.8 blood sugar was elevated 452, labs showed blood sugar 523 BUN 69 creatinine 10.2 sodium 134 alkaline phosphatase 229.  She had an anion gap 16.  CBC shows normal white cell count H&H is 12.2/38.7, platelet count 90  CT scan of abdomen is consistent with 3.5 and 3.2 into 3.2 cm fluid collection with surrounding fat stranding on abdominal wall at the site of previous peritoneal dialysis, extensive diffuse anasarca.      Past Medical History:     Past Medical History:   Diagnosis Date    Chronic obstructive pulmonary disease     Diabetes mellitus     Hyperlipidemia     Hypertension     Sleep apnea 2018       Past Surgical History:     Past Surgical History:   Procedure Laterality Date    EGD, BIOPSY N/A 05/14/2019    Procedure: EGD, BIOPSY;  Surgeon: Ronie Spies, MD;  Location: [facility];  Service: Gastroenterology;  Laterality: N/A;    EXPLORATORY LAPAROTOMY N/A 05/27/2019    Procedure: EXPLORATORY LAPAROTOMY;  Surgeon: Herbert Moors., MD;  Location: [facility];  Service: General;  Laterality: N/A;    JOINT REPLACEMENT      shoulder    JOINT REPLACEMENT      knee    OVARY SURGERY Left     Removal    REMOVAL, FOREIGN BODY N/A 05/27/2019    Procedure: REMOVAL, PERITONEAL DIALYSIS CATHETER;  Surgeon: Kalman Jewels  Maury Dus., MD;  [facility];  Service: General;  Laterality: N/A;    TUNNELED CATH CHECK/CHANGE (PERMCATH) N/A 07/03/2019    Procedure: TUNNELED CATH CHECK/CHANGE;  Surgeon: Maureen Ralphs, MD;  Location: [facility];  Service: Interventional Radiology;  Laterality: N/A;    TUNNELED CATH PLACEMENT ([facility]MCATH) Bilateral 05/23/2019    Procedure: TUNNELED CATH PLACEMENT;  Surgeon: Jacques Earthly, MD;  Location: [facility];  Service: Interventional Radiology;  Laterality: Bilateral;    TUNNELED CATH PLACEMENT ([facility]MCATH) Right 07/31/2019    Procedure: TUNNELED CATH PLACEMENT;  Surgeon: Jacques Earthly, MD;  Location:  [facility];  Service: Interventional Radiology;  Laterality: Right;       Family History:     Family History   Problem Relation Age of Onset    Diabetes Mother     Hypertension Mother     Diabetes Father     Hypertension Father     Diabetes Sister     Hypertension Sister     Diabetes Brother     Hypertension Brother     Diabetes Paternal Aunt     Diabetes Paternal Uncle        Social History:     Social History     Socioeconomic History    Marital status: Legally Separated     Spouse name: Not on file    Number of children: Not on file    Years of education: Not on file    Highest education level: Not on file   Occupational History    Not on file   Tobacco Use    Smoking status: Never    Smokeless tobacco: Never   Vaping Use    Vaping status: Never Used   Substance and Sexual Activity    Alcohol use: Never    Drug use: Never    Sexual activity: Not Currently   Other Topics Concern    Not on file   Social History Narrative    Not on file     Social Determinants of Health     Financial Resource Strain: Not on file   Food Insecurity: Not on file   Transportation Needs: Not on file   Physical Activity: Not on file   Stress: Not on file   Social Connections: Not on file   Intimate Partner Violence: Not on file   Housing Stability: Not on file       Allergies:     Allergies   Allergen Reactions    Lisinopril Hives    Metformin Hives       Medications:   (Not in a hospital admission)                           Current/Home Medications    ACETAMINOPHEN (TYLENOL) 500 MG TABLET    Take 500 mg by mouth every 6 (six) hours as needed for Pain    ALBUTEROL SULFATE HFA (PROVENTIL) 108 (90 BASE) MCG/ACT INHALER    Inhale 2 puffs into the lungs every 6 (six) hours as needed for Wheezing or Shortness of Breath    AMLODIPINE (NORVASC) 10 MG TABLET    Take 1 tablet by mouth daily    ASPIRIN 81 MG CHEWABLE TABLET    Chew 81 mg by mouth daily    DEXTROMETHORPHAN-GUAIFENESIN (MUCINEX DM) 30-600 MG PER 12 HR TABLET    Take 1 tablet by mouth  every 12 (twelve) hours    GABAPENTIN (NEURONTIN) 100  MG CAPSULE    Take 1 capsule (100 mg) by mouth every 8 (eight) hours    INSULIN ASPART (NOVOLOG) 100 UNIT/ML INJECTION    Inject 15 Units into the skin 3 (three) times daily before meals    INSULIN DEGLUDEC (TRESIBA SC)    Inject 35 Unit into the skin    PANTOPRAZOLE (PROTONIX) 40 MG TABLET    Take 40 mg by mouth daily    PETROLATUM (AQUAPHOR) OINTMENT    Apply topically every 12 (twelve) hours    SEVELAMER (RENVELA) 800 MG TABLET    Take 800 mg by mouth 2 (two) times daily with meals                        Review of Systems:   General: No wt loss , wt gain, distress,  NO Fever , chills, heat col intolerance,  HEENT:No headache, rhinorrhea, lacrimation,diplopia , photophobia. Sinus pain , .  Pulmonary: No cough, sputum, pleuritic chest pain, +ve SOB,Dyspnea.  Cardiac:NO  chest pain, palpitation,+ve DOE,NO  LOC,  Dizziness, orthopnea, PND.  GI: no nausea , vomiting , diarrhea, melena ,BRBPR, Weight loss.  Urogenital: NO  dysuria, burning, Hematuria.  Neurology: No focal weakness, Numbness tingling.    Physical Exam:     Vitals:    04/13/21 1800   BP: (!) 178/98   Pulse: 74   Resp:    Temp:    SpO2:        Temp (24hrs), Avg:97.6 F (36.4 C), Min:97.3 F (36.3 C), Max:98.2 F (36.8 C)      General Appearance: No acute distress.  Head:  Normocephalic  Eyes:  PERLA, EOM's intact  Neck:  Supple, No JVD  Lungs: Basilar crackles, no wheezes, rhonchi or rales.  Heart:  S1, S2 normal, no murmurs.  Abdomen:  Soft, non-tender, non-distended, surgical scar on anterior abdominal wall, skin breakdown with serosanguineous drainage.  Extremities: Positive  edema.  Neurologic:  Alert and oriented x3, mood and affect normal.   Cranial Nerves II-XII grossly intact.  No gross focal deficit    Labs:.      Results       Procedure Component Value Units Date/Time    Phosphorus [[id number]]  (Abnormal) Collected: 04/13/21 1526    Specimen: Blood Updated: 04/13/21 1750     Phosphorus 6.8  mg/dL     Glucose Whole Blood - POCT [[id number]]  (Abnormal) Collected: 04/13/21 1618     Updated: 04/13/21 1623     Whole Blood Glucose POCT 452 mg/dL     Comprehensive metabolic panel [[id number]]  (Abnormal) Collected: 04/13/21 1526    Specimen: Blood Updated: 04/13/21 1621     Glucose 523 mg/dL      BUN 69.0 mg/dL      Creatinine 10.2 mg/dL      Sodium 134 mEq/L      Potassium 4.2 mEq/L      Chloride 99 mEq/L      CO2 19 mEq/L      Calcium 8.0 mg/dL      Protein, Total 5.9 g/dL      Albumin 2.5 g/dL      AST (SGOT) 18 U/L      ALT 23 U/L      Alkaline Phosphatase 229 U/L      Bilirubin, Total 0.6 mg/dL      Globulin 3.4 g/dL      Albumin/Globulin Ratio 0.7     Anion Gap 16.0    Lipase [  [id number]] Collected: 04/13/21 1526    Specimen: Blood Updated: 04/13/21 1621     Lipase 43 U/L     GFR [[id number]] Collected: 04/13/21 1526     Updated: 04/13/21 1621     EGFR 4.6    COVID-19 (SARS-CoV-2) and Influenza A/B, NAA ([facility])- Admission [[id number]] Collected: 04/13/21 1529    Specimen: Culturette from Nasopharyngeal Updated: 04/13/21 1615     Purpose of COVID testing Diagnostic -[facility]     SARS-CoV-2 Specimen Source Nasal Swab     SARS CoV 2 Overall Result Not Detected     Influenza A Not Detected     Influenza B Not Detected    Narrative:      o Collect and clearly label specimen type:  o PREFERRED-Upper respiratory specimen: One Nasal Swab in  Transport Media.  o Hand deliver to laboratory ASAP  Diagnostic -[facility]    CBC and differential [[id number]]  (Abnormal) Collected: 04/13/21 1526    Specimen: Blood Updated: 04/13/21 1606     WBC 5.06 x10 3/uL      Hgb 12.2 g/dL      Hematocrit 38.7 %      Platelets 90 x10 3/uL      RBC 4.30 x10 6/uL      MCV 90.0 fL      MCH 28.4 pg      MCHC 31.5 g/dL      RDW 14 %      MPV 13.7 fL      Instrument Absolute Neutrophil Count 3.68 x10 3/uL      Neutrophils 72.7 %      Lymphocytes Automated 14.6 %      Monocytes 10.9 %      Eosinophils Automated 0.6 %      Basophils Automated 0.6 %       Immature Granulocytes 0.6 %      Nucleated RBC 0.0 /100 WBC      Neutrophils Absolute 3.68 x10 3/uL      Lymphocytes Absolute Automated 0.74 x10 3/uL      Monocytes Absolute Automated 0.55 x10 3/uL      Eosinophils Absolute Automated 0.03 x10 3/uL      Basophils Absolute Automated 0.03 x10 3/uL      Immature Granulocytes Absolute 0.03 x10 3/uL      Absolute NRBC 0.00 x10 3/uL     Venous Blood Gas [[id number]] Collected: 04/13/21 1529    Specimen: Blood Updated: 04/13/21 1603     pH, Ven 7.266     pCO2, Venous 51.0 mmhg      pO2, Venous 60.4 mmhg      HCO3, Ven 22.4 mEq/L      Venous Total CO2 46.8 mEq/L      Base Excess, Ven -4.4 mEq/L      O2 Sat, Venous 84.9 %      Temperature 37.0     VBG CollectionSite rac    Lactic Acid [[id number]] Collected: 04/13/21 1529    Specimen: Blood Updated: 04/13/21 1603     Lactic Acid 1.4 mmol/L     Hepatitis B (HBV) Surface Antigen w/ Reflex to Confirmation [[id number]] Collected: 04/13/21 1529    Specimen: Blood Updated: 04/13/21 1541    Hepatitis B (HBV) Surface Antibody Quant [[id number]] Collected: 04/13/21 1529    Specimen: Blood Updated: 04/13/21 1541    Hepatitis C (HCV) antibody, Total [[id number]] Collected: 04/13/21 1529    Specimen: Blood Updated: 04/13/21 1541    Culture Blood Aerobic and Anaerobic [[id number]] Collected:  04/13/21 1529    Specimen: Arm from Blood, Venipuncture Updated: 04/13/21 1541    Narrative:      1 BLUE+1 PURPLE    Culture Blood Aerobic and Anaerobic [[id number]] Collected: 04/13/21 1529    Specimen: Arm from Blood, Venipuncture Updated: 04/13/21 1541    Narrative:      1 BLUE+1 PURPLE            Rads:     Radiology Results (24 Hour)       Procedure Component Value Units Date/Time    XR Chest  AP Portable [[id number]] Collected: 04/13/21 1633    Order Status: Completed Updated: 04/13/21 1636    Narrative:      HISTORY: Hypoxia    EXAMINATION: Portable frontal view of chest at 1605    COMPARISON: 03/04/2021     FINDINGS: Cardiac silhouette enlarged but  stable. No focal  consolidation or effusions. No acute edema. Pleural surfaces are intact.  Redemonstration of hemiarthroplasty of the visualized left shoulder.      Impression:       Stable cardiac enlargement without radiographic evidence of  acute edema nor pneumonia at this time.    Isabelle Course, MD  04/13/2021 4:34 PM    CT Abdomen Pelvis WO IV/ WO PO Cont [[id number]] Collected: 04/13/21 1555    Order Status: Completed Updated: 04/13/21 1619    Narrative:      EXAM: CT OF THE ABDOMEN AND PELVIS WITHOUT CONTRAST    CLINICAL HISTORY: 66 year old woman, abdominal pain, fever, postop, PD  catheter insertion site wound dehiscence/infection, pain and bleeding  from PD insertion site    TECHNIQUE: Multidetector axial CT images of the abdomen and pelvis were  obtained without intravenous contrast. Oral contrast was not  administered. Sagittal and coronal reconstructions were obtained. The  following dose reduction techniques were utilized: automated exposure  control and/or adjustment of the mA and/or kV according to patient size,  and the use of iterative reconstruction technique.    COMPARISON: CT of the abdomen and pelvis performed 01/05/2021      FINDINGS:    Visualized portions of the lower lung fields: Trace bilateral pleural  effusions. Enlarged heart.    Liver: Normal in size and contour.  Gallbladder: Gallstones are identified.  Pancreas: Normal in size and contour.  Spleen: Normal in size and contour.  Adrenals: Normal in size and contour.    Kidneys: Normal in size and contour. No nephrolithiasis, hydronephrosis,  or perinephric stranding.  Reproductive Organs: Coarse calcifications in the uterus likely  represent fibroids.  Bladder: Not well distended.    Bowel/peritoneum: There is a small hiatal hernia. There is a small  umbilical hernia containing fat and fluid. Approximately 6 cm above the  umbilicus to the left of midline there is a 3.5 x 3.2 x 3.2 cm fluid  collection with surrounding fat stranding and  a focus of gas extending  from the abdominal wall to the skin surface (axial series 2, image 35;  sagittal series 5, image 44). This is likely the site of the prior  peritoneal dialysis catheter with either active inflammation or a  developing abscess. There is mild abdominal ascites. There is no bowel  obstruction or pneumoperitoneum. The appendix is unremarkable. Note, the  bowel is suboptimally evaluated without oral contrast distention. There  is extensive diffuse anasarca, worsened compared to prior study.    Lymph Nodes: Left inguinal lymph nodes measure up to 10 mm in short  axis, similar to prior study.  No mesenteric or retroperitoneal  lymphadenopathy.  Abdominal aorta: Normal in caliber with extensive atherosclerotic  calcification.    Bones: Mild degenerative changes.      Impression:          1.Focal 3.5 x 3.2 x 3.2 cm fluid collection with surrounding fat  stranding and a focus of gas extending from the abdominal wall to the  skin surface, likely the site of the prior peritoneal dialysis catheter.  Findings likely represent active inflammation or a developing abscess.  Clinical correlation is recommended.    2.Extensive diffuse anasarca. Mild abdominal ascites. Trace bilateral  pleural effusions.    3.Nonspecific left inguinal lymphadenopathy, possibly reactive.    4.Cholelithiasis.    5.Small hiatal hernia.    6.Leiomyomatous uterus.    7.Cardiomegaly.    Cynda Familia, MD  04/13/2021 4:17 PM              EKG           Signed by: Barbaraann Faster, MD, MD    *This note was generated by the Epic EMR system/ Dragon speech recognition and may contain inherent errors or omissions not intended by the user. Grammatical errors, random word insertions, deletions, pronoun errors and incomplete sentences are occasional consequences of this technology due to software limitations. Not all errors are caught or corrected. If there are questions or concerns about the content of this note or information contained within  the body of this dictation they should be addressed directly with the author for clarification

## 2021-04-13 NOTE — Progress Notes (Signed)
Arrived via stretcher from ED. Dx. Abdominal discomfort and no dialysis x 9 days. Pt. Alert and oriented x3. Restless. Lungs diminshed. Sob at rest. Sat 90% on 2 liters. Face puffy. Pt. With generalized non pitting edema. Good bruit and thrill in LAF. Telemetry SR with occasional PVC. C/o abdominal discomfort and itching. Dressing to abdomen intact. No bleeding noted. Stated she had " Chicken pox" that is why she did not go to dialysis. Nothing noted regarding that claim. Consent for dialysis on file. Report received from [facility] in ER.    04/13/21 1710   Vitals   Temp 97.3 F (36.3 C)   Heart Rate 77   Resp Rate 20   BP (!) 181/94   SpO2 90 %   O2 Device Nasal cannula   Assessment   Mental Status Alert;Oriented;Cooperative;Restless   Cardiac (WDL) X   Cardiac Regularity Irregular   Cardiac Symptoms None   Cardiac Rhythm Normal Sinus Rhythm   Ectopy PVC   Ectopy Frequency Occasional   Respiratory  X   Respiratory Pattern Dyspnea at rest   Bilateral Breath Sounds Diminished   Edema  X   Generalized Edema Non Pitting Edema   Facial +2   RLE Edema Non Pitting Edema   LLE Edema Non Pitting Edema   General Skin Color Appropriate for ethnicity   Skin Condition/Temp Warm   Gastrointestinal (WDL) X   Abdomen Inspection Soft;Rounded;Other (Comment)  (dressing to abdomen)   GI Symptoms None   Mobility Bed   Pain Assessment   Charting Type Assessment   Pain Scale Used Numeric Scale (0-10)   Numeric Pain Scale   Pain Score 7   POSS Score 1   Pain Location Abdomen   Pain Orientation Mid   Pain Descriptors Aching;Itching   Pain Frequency Continuous   Hemodialysis Comments   Pre-Hemodialysis Comments time out done. consent on file

## 2021-04-13 NOTE — Progress Notes (Signed)
Uf Of 3.1 liters. Tolerated well. Face without edema. Bilateral lower extremity edema 2+. Lungs diminshed, clear. No acute sob noted. O2 sat 100% on 2 liters nc. Telemetry SR with frequent pvc. LAF worked well. Hemostasis achieved. C/o generalized puritis. Dressing to abdomen clean and dry. For dialysis again tomorrow. Report called to [facility] RN   04/13/21 2000   Treatment Summary   Time Off Machine 2010   Duration of Treatment (Hours) 3   Treatment Type 1:1   Dialyzer Clearance Moderately streaked   Fluid Volume Off (mL) 3500   Prime Volume (mL) 200   Rinseback Volume (mL) 200   Fluid Given: Normal Saline (mL) 0   Fluid Given: PRBC  0 mL   Fluid Given: Albumin (mL) 0   Fluid Given: Other (mL) 0   Total Fluid Given 400   Hemodialysis Net Fluid Removed 3100   Post Treatment Assessment   Post-Treatment Weight (Kg) -3.1   Patient Response to Treatment tolerated well   Vitals   Temp 97.3 F (36.3 C)   Heart Rate 75   Resp Rate 16   BP 184/72   SpO2 100 %   O2 Device Nasal cannula   Assessment   Mental Status Oriented;Alert;Cooperative   Cardiac (WDL) X   Cardiac Regularity Irregular   Cardiac Symptoms None   Cardiac Rhythm Normal Sinus Rhythm   Ectopy PVC   Ectopy Frequency Frequent   Respiratory  WDL   Respiratory Pattern Regular;Easy   Bilateral Breath Sounds Clear;Diminished   Edema  X   Facial None   RLE Edema +2   LLE Edema +2   General Skin Color Appropriate for ethnicity   Skin Condition/Temp Warm   Gastrointestinal (WDL) X   Abdomen Inspection Soft;Rounded   GI Symptoms None   Mobility Bed   Pain Assessment   Charting Type Reassessment   Pain Scale Used Numeric Scale (0-10)   Numeric Pain Scale   Pain Score 7   POSS Score 1   Pain Location Abdomen   Pain Orientation Mid   Pain Descriptors Itching   Pain Frequency Continuous   Education   Person taught Patient   Knowledge basis Substantial   Topics taught Fluid Management;S&S of infection;Procedure   Teaching Tools Explain   Reponse Verbalizes Understanding    Bedside Nurse Communication   Name of bedside RN - pre dialysis [email or web address]

## 2021-04-13 NOTE — Plan of Care (Signed)
Problem: Renal Instability  Goal: Fluid and electrolyte balance are achieved/maintained  04/13/2021 1727 by Venetia Constable, RN  Flowsheets (Taken 04/13/2021 1725)  Fluid and electrolyte balance are achieved/maintained:   Monitor/assess lab values and report abnormal values   Monitor daily weight   Assess for confusion/personality changes   Assess and reassess fluid and electrolyte status   Observe for seizure activity and initiate seizure precautions if indicated   Observe for cardiac arrhythmias   Follow fluid restrictions/IV/PO parameters  04/13/2021 1726 by Venetia Constable, RN  Outcome: Progressing  04/13/2021 1725 by Venetia Constable, RN  Flowsheets (Taken 04/13/2021 1725)  Fluid and electrolyte balance are achieved/maintained:   Monitor/assess lab values and report abnormal values   Monitor daily weight   Assess for confusion/personality changes   Assess and reassess fluid and electrolyte status   Observe for seizure activity and initiate seizure precautions if indicated   Observe for cardiac arrhythmias   Follow fluid restrictions/IV/PO parameters     Problem: Patient Receiving Advanced Renal Therapies  Goal: Therapy access site remains intact  04/13/2021 1727 by Venetia Constable, RN  Flowsheets (Taken 04/13/2021 1725)  Therapy access site remains intact: Assess therapy access site  04/13/2021 1726 by Venetia Constable, RN  Outcome: Progressing  04/13/2021 1725 by Venetia Constable, RN  Flowsheets (Taken 04/13/2021 1725)  Therapy access site remains intact: Assess therapy access site

## 2021-04-13 NOTE — Progress Notes (Signed)
Initial Pharmacy Vancomycin Dosing Consult:  Day 1  Vancomycin Indication: Abdominal wall abscess at PD catheter site  Vancomycin Monitoring Goal: Pre-dialysis 15 - 25 mg/L    Pertinent Cultures:     Date Source  Organism & Pertinent Susceptibilities     04/13/2021 Blood x2 Pending                  As appropriate, contact physician to consider change in therapy if cultures grow organism other than MRSA     Serum creatinine: 10.2 mg/dL (H) 04/13/21 1526  Estimated creatinine clearance: 6.3 mL/min (A)    Baseline SCr: ESRD    Nephrotoxic drugs administered in the past 48 hours: Advanced Age (>65Y), Piperacillin/tazobactam    Recommendations:   Initial loading dose (18 mg/kg) = 2000 mg x1     Maintenance regimen: will dose by levels. Received HD today and has one scheduled tomorrow.   Vancomycin level w/ AM: 04/13/21    Thank you,  Ardine Bjork, [place]  Phone: [phone]

## 2021-04-14 LAB — URINALYSIS REFLEX TO MICROSCOPIC EXAM - REFLEX TO CULTURE
Bilirubin, UA: NEGATIVE
Glucose, UA: 500 — AB
Leukocyte Esterase, UA: NEGATIVE
Nitrite, UA: NEGATIVE
Protein, UR: >=500 — AB
Specific Gravity UA: 1.026 (ref 1.001–1.035)
Urine pH: 6 (ref 5.0–8.0)
Urobilinogen, UA: NEGATIVE mg/dL (ref 0.2–2.0)

## 2021-04-14 LAB — CBC
Absolute NRBC: 0 10*3/uL (ref 0.00–0.00)
Hematocrit: 35.1 % (ref 34.7–43.7)
Hgb: 11.3 g/dL — ABNORMAL LOW (ref 11.4–14.8)
MCH: 29 pg (ref 25.1–33.5)
MCHC: 32.2 g/dL (ref 31.5–35.8)
MCV: 90.2 fL (ref 78.0–96.0)
MPV: 13.2 fL — ABNORMAL HIGH (ref 8.9–12.5)
Nucleated RBC: 0 /100 WBC (ref 0.0–0.0)
Platelets: 82 10*3/uL — ABNORMAL LOW (ref 142–346)
RBC: 3.89 10*6/uL — ABNORMAL LOW (ref 3.90–5.10)
RDW: 14 % (ref 11–15)
WBC: 4.05 10*3/uL (ref 3.10–9.50)

## 2021-04-14 LAB — ECG 12-LEAD
Atrial Rate: 81 {beats}/min
P Axis: 63 degrees
P-R Interval: 144 ms
Q-T Interval: 424 ms
QRS Duration: 80 ms
QTC Calculation (Bezet): 492 ms
R Axis: 92 degrees
T Axis: 249 degrees
Ventricular Rate: 81 {beats}/min

## 2021-04-14 LAB — GLUCOSE WHOLE BLOOD - POCT
Whole Blood Glucose POCT: 133 mg/dL — ABNORMAL HIGH (ref 70–100)
Whole Blood Glucose POCT: 355 mg/dL — ABNORMAL HIGH (ref 70–100)
Whole Blood Glucose POCT: 230 mg/dL — ABNORMAL HIGH (ref 70–100)

## 2021-04-14 LAB — BASIC METABOLIC PANEL
Anion Gap: 10 (ref 5.0–15.0)
BUN: 42 mg/dL — ABNORMAL HIGH (ref 7.0–21.0)
CO2: 25 mEq/L (ref 17–29)
Calcium: 7.5 mg/dL — ABNORMAL LOW (ref 8.5–10.5)
Chloride: 104 mEq/L (ref 99–111)
Creatinine: 7.3 mg/dL — ABNORMAL HIGH (ref 0.4–1.0)
Glucose: 178 mg/dL — ABNORMAL HIGH (ref 70–100)
Potassium: 3.5 mEq/L (ref 3.5–5.3)
Sodium: 139 mEq/L (ref 135–145)

## 2021-04-14 LAB — GFR: EGFR: 6.8

## 2021-04-14 LAB — VANCOMYCIN, RANDOM: Vancomycin Random: 22.9 ug/mL

## 2021-04-14 LAB — PTH, INTACT: PTH Intact: 1579.2 pg/mL — ABNORMAL HIGH (ref 17.7–84.5)

## 2021-04-14 MED ORDER — SODIUM CHLORIDE 0.9 % IV BOLUS
250.0000 mL | INTRAVENOUS | Status: AC | PRN
Start: 2021-04-14 — End: 2021-04-14

## 2021-04-14 MED ORDER — VANCOMYCIN 1000 MG IN 250 ML NS IVPB VIAL-MATE (CNR)
1000.0000 mg | Freq: Once | INTRAVENOUS | Status: AC
Start: 2021-04-14 — End: 2021-04-14
  Administered 2021-04-14: 21:00:00 1000 mg via INTRAVENOUS
  Filled 2021-04-14: qty 250
  Filled 2021-04-14: qty 1

## 2021-04-14 MED ORDER — RISAQUAD PO CAPS
1.0000 | ORAL_CAPSULE | Freq: Every day | ORAL | Status: DC
Start: 2021-04-14 — End: 2021-04-19
  Administered 2021-04-14 – 2021-04-19 (×6): 1 via ORAL
  Filled 2021-04-14 (×6): qty 1

## 2021-04-14 MED ORDER — ONDANSETRON HCL 4 MG PO TABS
4.0000 mg | ORAL_TABLET | Freq: Three times a day (TID) | ORAL | Status: DC | PRN
Start: 2021-04-14 — End: 2021-04-19
  Filled 2021-04-14 (×3): qty 1

## 2021-04-14 MED ORDER — SODIUM CHLORIDE 0.9 % IV BOLUS
100.0000 mL | INTRAVENOUS | Status: AC | PRN
Start: 2021-04-14 — End: 2021-04-14

## 2021-04-14 MED ORDER — ONDANSETRON HCL 4 MG/2ML IJ SOLN
4.0000 mg | Freq: Three times a day (TID) | INTRAMUSCULAR | Status: DC | PRN
Start: 2021-04-14 — End: 2021-04-19
  Administered 2021-04-14 – 2021-04-15 (×2): 4 mg via INTRAVENOUS
  Filled 2021-04-14 (×2): qty 2

## 2021-04-14 NOTE — Consults (Addendum)
Infectious Diseases and Tropical Medicine Consult  Diane Footman, MD          Date Time: 04/14/21 11:32 AM  Patient Name: [name]  Referring Physician: Barbaraann Faster, MD      Reason for Consultation:     Abdominal wall abscess    Assessment:     Abdominal wall abscess/wound at the site of previous peritoneal dialysis catheter (removed 2 years ago)  End-stage renal disease on hemodialysis  Diabetes mellitus  COPD  Sleep apnea  No fever or leukocytosis    Recommendations:     Continue vancomycin and [place] monitoring vancomycin levels  Wound culture  Surgery evaluation  Wound cultures during incision and drainage  Probiotics  Monitor clinically      Discussed with nursing staff  Discussed with patient in detail                                                               History of Present Illness:     [name] is a 66 year old female with a history of hypertension, hyperlipidemia, diabetes mellitus, sleep apnea, COPD, end-stage renal disease on hemodialysis is admitted with abdominal wall drainage and bleeding.  The patient was on peritoneal dialysis until 2 years ago and is now converted into hemodialysis.  Peritoneal dialysis catheter have been removed.  No fevers or chills.  No chest pain cough or shortness of breath.  No abdominal pain, vomiting or diarrhea.  In the emergency room patient temperature is 98.2, blood pressure 197/91, pulse is 74 bpm, leukocyte count is 5.0, test for COVID-19 is negative, blood cultures are ordered and are pending and chest x-ray shows stable cardiac enlargement.  CT scan of abdominal pelvis shows focal fluid collection with focus of gas at the previous site of peritoneal dialysis catheter and extensive anasarca.  Patient is started on vancomycin and Zosyn.    Past Medical History:   Diagnosis Date    Chronic obstructive pulmonary disease     Diabetes mellitus     Hyperlipidemia     Hypertension     Sleep apnea 2018        Past Surgical History:    Procedure Laterality Date    EGD, BIOPSY N/A 05/14/2019    Procedure: EGD, BIOPSY;  Surgeon: Ronie Spies, MD;  Location: [facility];  Service: Gastroenterology;  Laterality: N/A;    EXPLORATORY LAPAROTOMY N/A 05/27/2019    Procedure: EXPLORATORY LAPAROTOMY;  Surgeon: Herbert Moors., MD;  Location: [facility];  Service: General;  Laterality: N/A;    JOINT REPLACEMENT      shoulder    JOINT REPLACEMENT      knee    OVARY SURGERY Left     Removal    REMOVAL, FOREIGN BODY N/A 05/27/2019    Procedure: REMOVAL, PERITONEAL DIALYSIS CATHETER;  Surgeon: Herbert Moors., MD;  Location: [facility];  Service: General;  Laterality: N/A;    TUNNELED CATH CHECK/CHANGE ([facility]) N/A 07/03/2019    Procedure: TUNNELED CATH CHECK/CHANGE;  Surgeon: Maureen Ralphs, MD;  Location: [facility];  Service: Interventional Radiology;  Laterality: N/A;    TUNNELED CATH PLACEMENT ([facility]) Bilateral 05/23/2019    Procedure: TUNNELED CATH PLACEMENT;  Surgeon: Jacques Earthly, MD;  Location: [facility];  Service: Interventional Radiology;  Laterality: Bilateral;    TUNNELED CATH PLACEMENT ([facility]) Right 07/31/2019    Procedure: TUNNELED CATH PLACEMENT;  Surgeon: Jacques Earthly, MD;  Location: [facility];  Service: Interventional Radiology;  Laterality: Right;        Family History   Problem Relation Age of Onset    Diabetes Mother     Hypertension Mother     Diabetes Father     Hypertension Father     Diabetes Sister     Hypertension Sister     Diabetes Brother     Hypertension Brother     Diabetes Paternal Aunt     Diabetes Paternal Uncle         Social History     Socioeconomic History    Marital status: Legally Separated     Spouse name: None    Number of children: None    Years of education: None    Highest education level: None   Occupational History    None   Tobacco Use    Smoking status: Never    Smokeless tobacco: Never   Vaping Use    Vaping status: Never Used   Substance and Sexual Activity    Alcohol  use: Never    Drug use: Never    Sexual activity: Not Currently   Other Topics Concern    None   Social History Narrative    None     Social Determinants of Health     Financial Resource Strain: Not on file   Food Insecurity: Not on file   Transportation Needs: Not on file   Physical Activity: Not on file   Stress: Not on file   Social Connections: Not on file   Intimate Partner Violence: Not on file   Housing Stability: Not on file        Allergies:     Allergies   Allergen Reactions    Lisinopril Hives    Metformin Hives        Review of Systems:     General:  Comfortable, no acute distress, no fever or chills  HEENT: no runny nose or sore throat  Respiratory: no cough or shortness of breath, no hematemesis or hemoptysis  Cardiac: no chest pain  Abdomen: no abdominal pain, no nausea vomiting or diarrhea, draining wound  Neurologic: awake and alert  Genitourinary: no hematuria  Extremities: no joint pains or swelling  Dermatologic: no skin rashes, no itching    Physical Exam:     Blood pressure (!) 118/38, pulse 62, temperature 97.7 F (36.5 C), resp. rate 18, height 1.575 m (5\' 2" ), weight 110.9 kg (244 lb 7.8 oz), SpO2 100 %.    General Appearance: Comfortable   HEENT:  Head is normocephalic, atraumatic, pupils are equal and reactive to light  Neck:    Supple,No neck lymphadenopathy, no thyromegaly  Lungs:  Clear to auscultation   Chest Wall: Symmetric chest wall expansion.   Heart : Regular rate and rhythm, no murmur or gallop  Abdomen: Abdomen is soft, nontender, good bowel sounds, no hepatosplenomegaly, upper abdominal wall wound draining thick pus  Neurological: no focal deficit  Extremities: No edema, no clubbing or cyanosis  Skin:  Warm and dry.No rash or ecchymosis.   Psychiatric: Mood and affect is normal    Labs:     Recent Labs     04/14/21  0341 04/13/21  1526   WBC 4.05 5.06   Hgb 11.3* 12.2  Hematocrit 35.1 38.7   Platelets 82* 90*   MCV 90.2 90.0       Recent Labs     04/14/21  0341 04/13/21  1526    Sodium 139 134*   Potassium 3.5 4.2   Chloride 104 99   CO2 25 19   BUN 42.0* 69.0*   Creatinine 7.3* 10.2*   Glucose 178* 523*   Calcium 7.5* 8.0*   Phosphorus  --  6.8*       Recent Labs     04/13/21  1526   AST (SGOT) 18   ALT 23   Alkaline Phosphatase 229*   Protein, Total 5.9*   Albumin 2.5*   Bilirubin, Total 0.6         Imaging studies:           Thanks for the consultation          Signed by: Diane Footman, MD, MD  Date Time: 04/14/21 11:32 AM    Pre-Visit Planning: Review previous office notes, results and consult notes before patient was seen in office. [ 15 min. ] Actual Visit Time: Actual face to face time with patient and/or family [ 30 min. ]. Post-Visit Planning: Total time spent documenting clinical information in the EMR, interpretation of results, care coordination (referrals). [15 min. ] Total time spent for all three categories during date of service: [60 min. ]    *This note was generated by the Epic EMR system/ Dragon speech recognition and may contain inherent errors or omissions not intended by the user. Grammatical errors, random word insertions, deletions, pronoun errors and incomplete sentences are occasional consequences of this technology due to software limitations. Not all errors are caught or corrected. If there are questions or concerns about the content of this note or information contained within the body of this dictation they should be addressed directly with the author for clarification

## 2021-04-14 NOTE — Progress Notes (Signed)
Pharmacy Vancomycin Dosing Consult:  Day 2  Vancomycin Indication: Abdominal wall abscess at PD catheter site  Vancomycin Monitoring Goal: 15-25mg /dL predialysis level for hemodialysis patients      Patient Parameters  Age: 66 y.o.  Wt Readings from Last 1 Encounters:   04/13/21 110.9 kg (244 lb 7.8 oz)         Serum creatinine: 7.3 mg/dL (H) 04/14/21 0341  Estimated creatinine clearance: 8.9 mL/min (A)  Recent Labs   Lab 04/14/21  0341   Vancomycin Random 22.9       Pertinent Cultures:     Date Source  Organism & Pertinent Susceptibilities     04/13/21 Blood x2 Pending                  As appropriate, contact physician to consider change in therapy if cultures grow organism other than MRSA       Assessment   Vancomycin therapy is for empiric treatment of Abd wall abscess  Patients renal function is not applicable as patient requires hemodialysis for ESRD   Vancomycin level is Within goal       Plan:   1. Give one time dose of 1000 mg at 1800  2. Next vancomycin level: Single random level ordered for 3/24 with AM labs  3. Check serum creatine: Daily  4. A pharmacist will continue to monitor vancomycin therapy daily    Thank you,  Milus Height, [facility]

## 2021-04-14 NOTE — ED Provider Notes (Signed)
Medical Decision Making    Provider Notes/Summary:     Chief Complaint   Patient presents with    Abdominal Pain         [name] is a 66 y.o. female presenting to the ED with cc of 2 days of pain and purulent/bloody drainage around PD catheter access site. Pt missed her last 3 dialysis sessions. Denies n/v, chest pain, or dyspnea.    Past Medical History:  Past Medical History:   Diagnosis Date    Chronic obstructive pulmonary disease     Diabetes mellitus     Hyperlipidemia     Hypertension     Sleep apnea 2018       Past Surgical History:  Past Surgical History:   Procedure Laterality Date    EGD, BIOPSY N/A 05/14/2019    Procedure: EGD, BIOPSY;  Surgeon: Ronie Spies, MD;  Location: [facility];  Service: Gastroenterology;  Laterality: N/A;    EXPLORATORY LAPAROTOMY N/A 05/27/2019    Procedure: EXPLORATORY LAPAROTOMY;  Surgeon: Herbert Moors., MD;  Location: [facility];  Service: General;  Laterality: N/A;    JOINT REPLACEMENT      shoulder    JOINT REPLACEMENT      knee    OVARY SURGERY Left     Removal    REMOVAL, FOREIGN BODY N/A 05/27/2019    Procedure: REMOVAL, PERITONEAL DIALYSIS CATHETER;  Surgeon: Herbert Moors., MD;  Location: [facility];  Service: General;  Laterality: N/A;    TUNNELED CATH CHECK/CHANGE ([facility]ATH) N/A 07/03/2019    Procedure: TUNNELED CATH CHECK/CHANGE;  Surgeon: Maureen Ralphs, MD;  Location: [facility];  Service: Interventional Radiology;  Laterality: N/A;    TUNNELED CATH PLACEMENT ([facility]ATH) Bilateral 05/23/2019    Procedure: TUNNELED CATH PLACEMENT;  Surgeon: Jacques Earthly, MD;  Location: [facility];  Service: Interventional Radiology;  Laterality: Bilateral;    TUNNELED CATH PLACEMENT ([facility]ATH) Right 07/31/2019    Procedure: TUNNELED CATH PLACEMENT;  Surgeon: Jacques Earthly, MD;  Location: [facility];  Service: Interventional Radiology;  Laterality: Right;       Family History:  Family History   Problem Relation Age of Onset    Diabetes  Mother     Hypertension Mother     Diabetes Father     Hypertension Father     Diabetes Sister     Hypertension Sister     Diabetes Brother     Hypertension Brother     Diabetes Paternal Aunt     Diabetes Paternal Uncle        Social History:  Social History     Tobacco Use    Smoking status: Never    Smokeless tobacco: Never   Vaping Use    Vaping status: Never Used   Substance Use Topics    Alcohol use: Never    Drug use: Never       Allergies:  Allergies   Allergen Reactions    Lisinopril Hives    Metformin Hives       BP 132/63   Pulse 63   Temp 97.7 F (36.5 C) (Oral)   Resp (!) 2   Ht 5\' 2"  (1.575 m)   Wt 110.9 kg   LMP  (LMP Unknown) Comment: post menopausal  SpO2 96%   BMI 44.72 kg/m       Constitutional: Alert, well appearing and in no distress.   Head: Normocephalic and atraumatic.   Mouth/Throat: Oropharynx is clear  and moist.   Eyes: Conjunctivae normal and EOM are normal. Pupils are equal and round.   Neck: Normal range of motion. Trachea midline.   Cardiovascular: Normal rate, regular rhythm  Pulmonary/Chest: Effort normal and lungs clear to auscultation bilaterally.   Abdominal: Soft. Non distended. Non tender. No rebound or guarding. PD access site with well healed scar. There is tenderness and fluctuance appreciated.  Musculoskeletal: No peripheral edema. No tenderness.    Neurological: Patient is alert and oriented to person, place, and time.  SILT in V1-V3, bilateral upper and lower extremities. Symmetric smile and eyebrow raise. Strength 5/5 in bl upper/lower extremities.   Skin: Skin is warm and dry. No rash  Psychiatric: Affect normal.     Patient Vitals for the past 12 hrs:   BP Temp Pulse Resp   04/14/21 0337 132/63 97.7 F (36.5 C) 63 (!) 2   04/13/21 2337 140/69 97.7 F (36.5 C) 66 18   04/13/21 2058 150/85 97.5 F (36.4 C) 74 17   04/13/21 2000 184/72 97.3 F (36.3 C) 75 16   04/13/21 1949 151/67 -- 77 --   04/13/21 1930 159/80 -- 75 --   04/13/21 1915 141/80 -- 77 --    04/13/21 1900 123/73 -- 78 --   04/13/21 1849 180/72 -- 74 --   04/13/21 1830 177/70 -- 74 --   04/13/21 1814 181/75 -- 74 --   04/13/21 1800 (!) 178/98 -- 74 --   04/13/21 1745 (!) 170/92 -- 82 --   04/13/21 1730 189/77 -- 77 --   04/13/21 1710 (!) 181/94 97.3 F (36.3 C) 77 20   04/13/21 1700 -- 97.3 F (36.3 C) -- --   04/13/21 1604 166/69 -- 75 16       Labs -     Results       Procedure Component Value Units Date/Time    Glucose Whole Blood - POCT [[id number]]  (Abnormal) Collected: 04/13/21 2128     Updated: 04/13/21 2148     Whole Blood Glucose POCT 296 mg/dL     Hepatitis C (HCV) antibody, Total [[id number]] Collected: 04/13/21 1529    Specimen: Blood Updated: 04/13/21 2120     Hepatitis C, AB Non-Reactive    Hepatitis B (HBV) Surface Antigen w/ Reflex to Confirmation [[id number]] Collected: 04/13/21 1529    Specimen: Blood Updated: 04/13/21 2119     Hepatitis B Surface Antigen Non-Reactive    Hepatitis B (HBV) Surface Antibody Quant [[id number]] Collected: 04/13/21 1529    Specimen: Blood Updated: 04/13/21 2119     HEPATITIS B SURFACE ANTIBODY <3.31 mIU/mL     Glucose Whole Blood - POCT [[id number]]  (Abnormal) Collected: 04/13/21 2009     Updated: 04/13/21 2011     Whole Blood Glucose POCT 295 mg/dL     Culture Blood Aerobic and Anaerobic [[id number]] Collected: 04/13/21 1529    Specimen: Arm from Blood, Venipuncture Updated: 04/13/21 2003    Narrative:      1 BLUE+1 PURPLE    Culture Blood Aerobic and Anaerobic [[id number]] Collected: 04/13/21 1529    Specimen: Arm from Blood, Venipuncture Updated: 04/13/21 2003    Narrative:      1 BLUE+1 PURPLE    Phosphorus [[id number]]  (Abnormal) Collected: 04/13/21 1526    Specimen: Blood Updated: 04/13/21 1750     Phosphorus 6.8 mg/dL     Glucose Whole Blood - POCT [[id number]]  (Abnormal) Collected: 04/13/21 1618     Updated: 04/13/21 1623  Whole Blood Glucose POCT 452 mg/dL     Comprehensive metabolic panel [[id number]]  (Abnormal) Collected: 04/13/21 1526     Specimen: Blood Updated: 04/13/21 1621     Glucose 523 mg/dL      BUN 69.0 mg/dL      Creatinine 10.2 mg/dL      Sodium 134 mEq/L      Potassium 4.2 mEq/L      Chloride 99 mEq/L      CO2 19 mEq/L      Calcium 8.0 mg/dL      Protein, Total 5.9 g/dL      Albumin 2.5 g/dL      AST (SGOT) 18 U/L      ALT 23 U/L      Alkaline Phosphatase 229 U/L      Bilirubin, Total 0.6 mg/dL      Globulin 3.4 g/dL      Albumin/Globulin Ratio 0.7     Anion Gap 16.0    Lipase [[id number]] Collected: 04/13/21 1526    Specimen: Blood Updated: 04/13/21 1621     Lipase 43 U/L     GFR [[id number]] Collected: 04/13/21 1526     Updated: 04/13/21 1621     EGFR 4.6    COVID-19 (SARS-CoV-2) and Influenza A/B, NAA ([facility])- Admission [[id number]] Collected: 04/13/21 1529    Specimen: Culturette from Nasopharyngeal Updated: 04/13/21 1615     Purpose of COVID testing Diagnostic -[facility]     SARS-CoV-2 Specimen Source Nasal Swab     SARS CoV 2 Overall Result Not Detected     Influenza A Not Detected     Influenza B Not Detected    Narrative:      o Collect and clearly label specimen type:  o PREFERRED-Upper respiratory specimen: One Nasal Swab in  Transport Media.  o Hand deliver to laboratory ASAP  Diagnostic -[facility]    CBC and differential [[id number]]  (Abnormal) Collected: 04/13/21 1526    Specimen: Blood Updated: 04/13/21 1606     WBC 5.06 x10 3/uL      Hgb 12.2 g/dL      Hematocrit 38.7 %      Platelets 90 x10 3/uL      RBC 4.30 x10 6/uL      MCV 90.0 fL      MCH 28.4 pg      MCHC 31.5 g/dL      RDW 14 %      MPV 13.7 fL      Instrument Absolute Neutrophil Count 3.68 x10 3/uL      Neutrophils 72.7 %      Lymphocytes Automated 14.6 %      Monocytes 10.9 %      Eosinophils Automated 0.6 %      Basophils Automated 0.6 %      Immature Granulocytes 0.6 %      Nucleated RBC 0.0 /100 WBC      Neutrophils Absolute 3.68 x10 3/uL      Lymphocytes Absolute Automated 0.74 x10 3/uL      Monocytes Absolute Automated 0.55 x10 3/uL      Eosinophils Absolute Automated  0.03 x10 3/uL      Basophils Absolute Automated 0.03 x10 3/uL      Immature Granulocytes Absolute 0.03 x10 3/uL      Absolute NRBC 0.00 x10 3/uL     Venous Blood Gas [[id number]] Collected: 04/13/21 1529    Specimen: Blood Updated: 04/13/21 1603     pH, Ven 7.266  pCO2, Venous 51.0 mmhg      pO2, Venous 60.4 mmhg      HCO3, Ven 22.4 mEq/L      Venous Total CO2 46.8 mEq/L      Base Excess, Ven -4.4 mEq/L      O2 Sat, Venous 84.9 %      Temperature 37.0     VBG CollectionSite rac    Lactic Acid [[id number]] Collected: 04/13/21 1529    Specimen: Blood Updated: 04/13/21 1603     Lactic Acid 1.4 mmol/L             Radiologic Studies -   Radiology Results (24 Hour)       Procedure Component Value Units Date/Time    XR Chest  AP Portable [[id number]] Collected: 04/13/21 1633    Order Status: Completed Updated: 04/13/21 1636    Narrative:      HISTORY: Hypoxia    EXAMINATION: Portable frontal view of chest at 1605    COMPARISON: 03/04/2021     FINDINGS: Cardiac silhouette enlarged but stable. No focal  consolidation or effusions. No acute edema. Pleural surfaces are intact.  Redemonstration of hemiarthroplasty of the visualized left shoulder.      Impression:       Stable cardiac enlargement without radiographic evidence of  acute edema nor pneumonia at this time.    Isabelle Course, MD  04/13/2021 4:34 PM    CT Abdomen Pelvis WO IV/ WO PO Cont [[id number]] Collected: 04/13/21 1555    Order Status: Completed Updated: 04/13/21 1619    Narrative:      EXAM: CT OF THE ABDOMEN AND PELVIS WITHOUT CONTRAST    CLINICAL HISTORY: 66 year old woman, abdominal pain, fever, postop, PD  catheter insertion site wound dehiscence/infection, pain and bleeding  from PD insertion site    TECHNIQUE: Multidetector axial CT images of the abdomen and pelvis were  obtained without intravenous contrast. Oral contrast was not  administered. Sagittal and coronal reconstructions were obtained. The  following dose reduction techniques were utilized:  automated exposure  control and/or adjustment of the mA and/or kV according to patient size,  and the use of iterative reconstruction technique.    COMPARISON: CT of the abdomen and pelvis performed 01/05/2021      FINDINGS:    Visualized portions of the lower lung fields: Trace bilateral pleural  effusions. Enlarged heart.    Liver: Normal in size and contour.  Gallbladder: Gallstones are identified.  Pancreas: Normal in size and contour.  Spleen: Normal in size and contour.  Adrenals: Normal in size and contour.    Kidneys: Normal in size and contour. No nephrolithiasis, hydronephrosis,  or perinephric stranding.  Reproductive Organs: Coarse calcifications in the uterus likely  represent fibroids.  Bladder: Not well distended.    Bowel/peritoneum: There is a small hiatal hernia. There is a small  umbilical hernia containing fat and fluid. Approximately 6 cm above the  umbilicus to the left of midline there is a 3.5 x 3.2 x 3.2 cm fluid  collection with surrounding fat stranding and a focus of gas extending  from the abdominal wall to the skin surface (axial series 2, image 35;  sagittal series 5, image 44). This is likely the site of the prior  peritoneal dialysis catheter with either active inflammation or a  developing abscess. There is mild abdominal ascites. There is no bowel  obstruction or pneumoperitoneum. The appendix is unremarkable. Note, the  bowel is suboptimally evaluated without oral contrast distention. There  is extensive diffuse anasarca, worsened compared to prior study.    Lymph Nodes: Left inguinal lymph nodes measure up to 10 mm in short  axis, similar to prior study. No mesenteric or retroperitoneal  lymphadenopathy.  Abdominal aorta: Normal in caliber with extensive atherosclerotic  calcification.    Bones: Mild degenerative changes.      Impression:          1.Focal 3.5 x 3.2 x 3.2 cm fluid collection with surrounding fat  stranding and a focus of gas extending from the abdominal wall to  the  skin surface, likely the site of the prior peritoneal dialysis catheter.  Findings likely represent active inflammation or a developing abscess.  Clinical correlation is recommended.    2.Extensive diffuse anasarca. Mild abdominal ascites. Trace bilateral  pleural effusions.    3.Nonspecific left inguinal lymphadenopathy, possibly reactive.    4.Cholelithiasis.    5.Small hiatal hernia.    6.Leiomyomatous uterus.    7.Cardiomegaly.    Cynda Familia, MD  04/13/2021 4:17 PM        .    ED Course as of 04/14/21 0340   Wed Apr 13, 2021   1631 Patient's case was discussed with Dr. Leslee Home who agrees with plan for admission to inpatient medical floor with telemetry. [OM]   3300 Case discussed with Dr. Posey Pronto of general surgery regarding PD access site abscess -surgical team will evaluate. [OM]      ED Course User Index  [OM] Salimatou Simone, MD       MDM:  Patient's presentation is consistent with PD catheter site infection / volume overload due to ESRD with HD non compliance. CTAP obtained to evaluate for abscess which did reveal a fluid collection concerning for abscess / infection. Patient given broad spectrum abx and general surgery consulted for management recommendations. D/w nephrology Dr. Drema Pry to facilitate HD session as inpatient which has been arranged. Pt with mild metabolic acidosis and acidosis on VBG which I believe is primarily driven by BUN elevation 2/2 to HD non compliance. Will administer 10U insulin sc for hypergylcemia as patient already volume overloaded and likely will not tolerate IVF for glucose clearance. Case d/w Dr. Felipe Drone who accepts pt for admission.    EKG:  Interpreted by myself:    Rate: 81   Rhythm: Normal Sinus Rhythm   Intervals: 80 QRS, 492 QTc   Interpretation: No significant ST elevations, ST depressions, or ischemic ST segment changes.    Comparison: No significant change in comparison to prior ECG dated Mar 03 2021    SEP-1 documentation    Please select option A (exclude),  B (severe sepsis), or C (septic shock) from the drop-down menu below:    A. ------------Severe Sepsis/Septic Shock Exclusion----------------------------    Pt is excluded from severe sepsis or septic shock consideration at 7622 [enter time of bed request placement] because of one or more reasons below:    All SIRS, abnl vitals, organ dysfunction are NOT due to severe sepsis or septic shock, but due to alternative cause: CHF exacerbation and Other: ESRD, chronic thrombocytopenia [list cause].     The timestamp from the preceding paragraph above also applies to every infectious and/or SEP-1-related diagnosis in Clinical Impression or MDM section of this note, as well as to any mention of the words "sepsis", "infection" or any infection/sepsis-related word or phrase in any part of this note.     Sepsis Components Checklist:    The following sepsis components were completed:    -  Blood cultures before antibiotics: Yes  - Broad-spectrum antibiotics: Yes  - Lactic acid: Yes  - Repeat lactic acid (if applicable): N/A  - 30 cc/kg bolus: No. If 30 cc/kg bolus not applicable, was any amount of fluid given? No    - Pressors: N/A    CRITICAL CARE: The high probability of sudden, clinically significant deterioration in the patient's condition required the highest level of my preparedness to intervene urgently.    The services I provided to this patient were to treat and/or prevent clinically significant deterioration that could result in: respiratory arrest, shock;  Services included the following: chart data review, reviewing nursing notes and/or old charts, documentation time, consultant collaboration regarding findings and treatment options, medication orders and management, direct patient care, re-evaluations, vital sign assessments and ordering, interpreting and reviewing diagnostic studies/lab tests.    Aggregate critical care time was 30 minutes, which includes only time during which I was engaged in work directly related  to the patient's care, as described above, whether at the bedside or elsewhere in the Emergency Department.  It did not include time spent performing other reported procedures or the services of residents, students, nurses or physician assistants.    Primary Admitting Service:    I have discussed this case with Dr. Kela Millin at [phone] (time), who accepts patient for admission and requests Inpatient Acute Care (Floor) with Remote Tele bed.                        Milus Fritze, Sandria Manly, MD  04/14/21 [phone]

## 2021-04-14 NOTE — Progress Notes (Signed)
PROGRESS NOTE    Date Time: 04/14/21 6:12 AM  Patient Name: [name], [name]  Patient status: Inpatient  Hospital Day: 1      Assessment:   Abdominal wall abscess at PD catheter site.  ESRD On HD ,Missed HD for 1 Week  Fluid overloading, dyspnea and hypoxia  Chronic respiratory failure on home oxygen/COPD  Thrombocytopenia  Diabetes mellitus  Hypertension  Hyperlipidemia  Diabetic neuropathy  COPD  Obstructive sleep apnea  Severe obesity BMI 44.7  Full code   P  an:     Continue Zosyn, follow culture.  ID consulted.  Surgery consultation requested.  Continue dialysis per schedule.  Hypoxia improved use oxygen as needed patient on home oxygen.  Monitor platelet count no active bleeding no heparin.  Blood sugar improved on Lantus sliding scale coverage.  BP controlled on amlodipine.  On aspirin, statin.  On gabapentin  Respiratory status stable, symptomatic treatment  Supportive care  DVT prophylaxis  Discharge planning when stable  Continue rest of treatment.  Discussed plan with patient and nurse  Labs and data reviewed.  D/w Drs > [place] , [place]   Surgery consultation requested  Heme Consult for thrombocytopenia   Subjective:   Patient seen and examined :  Afebrile hemodynamically stable blood pressure improved.  Blood sugar improved.  No new symptoms  Review of Systems:   General ROS: no fever no chills,   CVS ROS: no CP, no palpitation, no orthopnea, no PND  RESP ROS: Shortness of breath improved no wheezing  GI ROS: no N/V, no diarrhea, no constipation, no melena, no hematochezia  GU ROS: no dysuria, no frequency, no hematuria, no flank pain  NEURO ROS: no focal weakness, no headache, no paraesthesia  MSK ROS: no limitation of movement, no joint pain, no swelling, no leg deformity    Medications:     Current Facility-Administered Medications   Medication Dose Route Frequency    amLODIPine  10 mg Oral Daily    aspirin  81 mg Oral Daily    atorvastatin  40 mg Oral Daily    carvedilol  25 mg Oral Q12H [facility]     dextromethorphan-guaiFENesin  1 tablet Oral Q12H    gabapentin  100 mg Oral Q8H [facility]    insulin glargine  25 Units Subcutaneous QHS    insulin lispro  1-4 Units Subcutaneous QHS    insulin lispro  1-8 Units Subcutaneous TID AC    insulin lispro  10 Units Subcutaneous Once    pantoprazole  40 mg Oral QAM AC    piperacillin-tazobactam  2.25 g Intravenous Q8H    sevelamer  800 mg Oral BID Meals    vancomycin   Intravenous See Admin Instructions       acetaminophen, albuterol sulfate HFA, dextrose **OR** dextrose **OR** dextrose **OR** glucagon (rDNA), diphenhydrAMINE, melatonin, morphine, naloxone, ondansetron **OR** ondansetron, oxyCODONE-acetaminophen, petrolatum    Physical Exam:   BP 132/63   Pulse 63   Temp 97.7 F (36.5 C) (Oral)   Resp 18   Ht 1.575 m (5\' 2" )   Wt 110.9 kg (244 lb 7.8 oz)   LMP  (LMP Unknown) Comment: post menopausal  SpO2 96%   BMI 44.72 kg/m     Intake and Output Summary (Last 24 hours) at Date Time    Intake/Output Summary (Last 24 hours) at 04/14/2021 0612  Last data filed at 04/13/2021 2000  Gross per 24 hour   Intake --   Output 3100 ml   Net -3100  ml       General: awake, alert, oriented x 3; no acute distress.  Severe obesity BMI 44.7.  Neck: Supple no JVD  ENT: PERRLA, EOMI  Lungs: Basilar crackles no wheezing  Heart: Regular rate and rhythm no gallop  Abdomen: Soft , old surgical scar skin breakdown above umbilical area dressing in place no bleeding positive mild serosanguineous drainage.  Neuro: Grossly intact  GU: No CVA tenderness  Extremities: Bilateral pitting edema    Labs:     CBC w/Diff CMP   Recent Labs   Lab 04/14/21  0341 04/13/21  1526   WBC 4.05 5.06   Hgb 11.3* 12.2   Hematocrit 35.1 38.7   Platelets 82* 90*   MCV 90.2 90.0   Neutrophils  --  72.7       PT/INR         Recent Labs   Lab 04/14/21  0341 04/13/21  1526   Sodium 139 134*   Potassium 3.5 4.2   Chloride 104 99   CO2 25 19   BUN 42.0* 69.0*   Creatinine 7.3* 10.2*   Glucose 178* 523*   Calcium 7.5* 8.0*    Phosphorus  --  6.8*   Protein, Total  --  5.9*   Albumin  --  2.5*   AST (SGOT)  --  18   ALT  --  23   Alkaline Phosphatase  --  229*   Bilirubin, Total  --  0.6      Glucose POCT   Recent Labs   Lab 04/14/21  0341 04/13/21  1526   Glucose 178* 523*              Rads:   XR Chest  AP Portable    Result Date: 04/13/2021   Stable cardiac enlargement without radiographic evidence of acute edema nor pneumonia at this time. Isabelle Course, MD 04/13/2021 4:34 PM    CT Abdomen Pelvis WO IV/ WO PO Cont    Result Date: 04/13/2021  1.Focal 3.5 x 3.2 x 3.2 cm fluid collection with surrounding fat stranding and a focus of gas extending from the abdominal wall to the skin surface, likely the site of the prior peritoneal dialysis catheter. Findings likely represent active inflammation or a developing abscess. Clinical correlation is recommended. 2.Extensive diffuse anasarca. Mild abdominal ascites. Trace bilateral pleural effusions. 3.Nonspecific left inguinal lymphadenopathy, possibly reactive. 4.Cholelithiasis. 5.Small hiatal hernia. 6.Leiomyomatous uterus. 7.Cardiomegaly. Cynda Familia, MD 04/13/2021 4:17 PM       Zaelynn Fuchs Felipe Drone, MD  04/14/2021    *This note was generated by the Epic EMR system/ Dragon speech recognition and may contain inherent errors or omissions not intended by the user. Grammatical errors, random word insertions, deletions, pronoun errors and incomplete sentences are occasional consequences of this technology due to software limitations. Not all errors are caught or corrected. If there are questions or concerns about the content of this note or information contained within the body of this dictation they should be addressed directly with the author for clarification

## 2021-04-14 NOTE — Progress Notes (Signed)
Initial Case Management Assessment and Discharge [place]   Patient Name: [name], HARTIN   Date of Birth 1955/09/13   Attending Physician: Barbaraann Faster, MD   Primary Care Physician: Horton Marshall, MD   Length of Stay 0   Reason for Consult / Chief Complaint IDPA        Situation   Admission DX:   1. Volume overload        A/O Status: X 3    LACE Score: 13    Patient admitted from: ER  Admission Status: inpatient    Health Care Agent: Self  Name:   Phone number:        Background     Advanced directive:   <no information>    Code Status:   Full Code     Residence: One story home/apartment    PCP: Horton Marshall, MD  Patient Contact:   [phone] (home)     [phone] (mobile)     Emergency contact:   Extended Emergency Contact Information  Primary Emergency Contact: okoronwo,shannon  Mobile Phone: [phone]  Relation: Daughter  Interpreter needed? No  Secondary Emergency Contact: Turin Sr  Mobile Phone: [phone]  Relation: Spouse      ADL/IADL's: Independent  Previous Level of function: 7 Independent     DME: None    Pharmacy:     [id number] - [address], [place]  [place] [address]  Phone: [phone] Fax: [phone]      Prescription Coverage: Yes    Home Health: The patient is not currently receiving home health services.    Previous SNF/AR:     COVID Vaccine Status: VACCINATED    Date First IMM given:   UAI on file?: No  Transport for discharge? Mode of transportation: Private Vehicle - Patient to drive self  Agreeable to Home with family post-discharge:  Yes     Assessment   CM met with pt at Bedside.  Pt is know to this writer from previous admissions.  Pt is a HD pt and goes to [facility] on [address].  CM provided notice to out HD Liaison [facility] for [facility] when appropriate.    BARRIERS TO DISCHARGE: none     Recommendation   D/C Plan A: Home with family    D/C Plan B: Home with family    D/C Plan C:  Home with family

## 2021-04-14 NOTE — Plan of Care (Signed)
Problem: Renal Instability  Goal: Fluid and electrolyte balance are achieved/maintained  Flowsheets (Taken 04/13/2021 1725)  Fluid and electrolyte balance are achieved/maintained:   Monitor/assess lab values and report abnormal values   Monitor daily weight   Assess for confusion/personality changes   Assess and reassess fluid and electrolyte status   Observe for seizure activity and initiate seizure precautions if indicated   Observe for cardiac arrhythmias   Follow fluid restrictions/IV/PO parameters     Problem: Patient Receiving Advanced Renal Therapies  Goal: Therapy access site remains intact  Flowsheets (Taken 04/13/2021 1725)  Therapy access site remains intact: Assess therapy access site

## 2021-04-14 NOTE — Progress Notes (Signed)
Patient is alert and oriented x4, scheduled meds administered and well tolerated. On 2L oxygen via nasal canula, no distress noted. Blood sugar checked. Patient had dialysis treatment, 3L of UF removed. Wound care done, wound culture done,  sent to the lab. Continues on IV ABX. Will continue with the treatment plan of care.

## 2021-04-14 NOTE — Progress Notes (Signed)
[facility] PROGRESS NOTE  [id number]      Date Time: 04/14/21 5:33 AM  Patient Name: [name]  Attending Physician: Barbaraann Faster, MD    CC: follow-up ESRD    Assessment:     ESRD on HD MWF at [facility] - followed by Dr. 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  Metabolic acidosis 2/2 missed HD  Abdominal cellulitis vs abscess - on iv flagyl, cefepime, vanco   Uncontrolled DM-Type 2 with hyperglycemia - BG~ 500  Pseudohyponatremia 2/2 hyperglycemia     Recommendations:     Dialysis again today  Will resume regular MWF schedule tomorrow      Case discussed with: RN      Armando Reichert, MD  [facility]  [id number] (office)      Subjective:     Feels better     Review of Systems:   No CP or abd pain      Physical Exam:     Vitals:    04/13/21 2000 04/13/21 2058 04/13/21 2337 04/14/21 0337   BP: 184/72 150/85 140/69 132/63   Pulse: 75 74 66 63   Resp: 16 17 18  (!) 2   Temp: 97.3 F (36.3 C) 97.5 F (36.4 C) 97.7 F (36.5 C) 97.7 F (36.5 C)   TempSrc:  Oral Oral Oral   SpO2: 100% 100% 98% 96%   Weight:  110.9 kg (244 lb 7.8 oz)     Height:           Intake and Output Summary (Last 24 hours) at Date Time    Intake/Output Summary (Last 24 hours) at 04/14/2021 0533  Last data filed at 04/13/2021 2000  Gross per 24 hour   Intake --   Output 3100 ml   Net -3100 ml       General: awake, alert, oriented x 3, no acute distress  Cardiovascular: regular rate and rhythm, no murmurs, rubs or gallops  Lungs: clear to auscultation bilaterally, without wheezing, rhonchi, or rales  Abdomen: soft, non-tender, non-distended, normoactive bowel sounds  Extremities: + edema      Meds:       Scheduled Meds: PRN Meds:    amLODIPine, 10 mg, Oral, Daily  aspirin, 81 mg, Oral, Daily  atorvastatin, 40 mg, Oral, Daily  carvedilol, 25 mg, Oral, Q12H [facility]  dextromethorphan-guaiFENesin, 1 tablet, Oral, Q12H  gabapentin, 100 mg, Oral, Q8H [facility]  insulin glargine, 25 Units, Subcutaneous, QHS  insulin lispro, 1-4 Units, Subcutaneous, QHS  insulin lispro, 1-8 Units, Subcutaneous, TID AC  insulin lispro, 10 Units, Subcutaneous, Once  pantoprazole, 40 mg, Oral, QAM AC  piperacillin-tazobactam, 2.25 g, Intravenous, Q8H  sevelamer, 800 mg, Oral, BID Meals  vancomycin, , Intravenous, See Admin Instructions          Continuous Infusions:   acetaminophen, 500 mg, Q6H PRN  albuterol sulfate HFA, 2 puff, Q6H PRN  dextrose, 15 g of glucose, PRN   Or  dextrose, 12.5 g, PRN   Or  dextrose, 12.5 g, PRN   Or  glucagon (rDNA), 1 mg, PRN  diphenhydrAMINE, 6.25 mg, Q6H PRN  melatonin, 3 mg, QHS PRN  morphine, 2 mg, Q4H PRN  naloxone, 0.2 mg, PRN  ondansetron, 4 mg, Q8H PRN   Or  ondansetron, 4 mg, Q8H PRN  oxyCODONE-acetaminophen, 1 tablet, Q4H PRN  petrolatum, , PRN              Labs:     Recent Labs   Lab 04/14/21  0341 04/13/21  1526   WBC 4.05 5.06   Hgb 11.3* 12.2   Hematocrit 35.1 38.7   Platelets 82* 90*     Recent Labs   Lab 04/14/21  0341 04/13/21  1526   Sodium 139 134*   Potassium 3.5 4.2   Chloride 104 99   CO2 25 19   BUN 42.0* 69.0*   Creatinine 7.3* 10.2*   Calcium 7.5* 8.0*   Albumin  --  2.5*   Phosphorus  --  6.8*   Glucose 178* 523*   EGFR 6.8 4.6             Invalid input(s): LEUKOCYTESUR        Imaging personally reviewed, including: XR Chest  AP Portable    Result Date: 04/13/2021   Stable cardiac enlargement without radiographic evidence of acute edema nor pneumonia at this time. Isabelle Course, MD 04/13/2021 4:34 PM    CT Abdomen Pelvis WO IV/ WO PO Cont    Result Date: 04/13/2021  1.Focal 3.5 x 3.2 x 3.2 cm fluid collection with surrounding fat stranding and a focus of gas extending from the abdominal wall to the  skin surface, likely the site of the prior peritoneal dialysis catheter. Findings likely represent active inflammation or a developing abscess. Clinical correlation is recommended. 2.Extensive diffuse anasarca. Mild abdominal ascites. Trace bilateral pleural effusions. 3.Nonspecific left inguinal lymphadenopathy, possibly reactive. 4.Cholelithiasis. 5.Small hiatal hernia. 6.Leiomyomatous uterus. 7.Cardiomegaly. Cynda Familia, MD 04/13/2021 4:17 PM         Signed by: Armando Reichert, MD

## 2021-04-14 NOTE — UM Notes (Signed)
Milius,Nehemiah L  1955-07-07        Pt is a 66 y.o. female who arrived in the ER with C/OAbdominal Pain    04/14/21 1350  Place for Observation Services  Once        Diagnosis: Volume Overload    Level of Care: Acute    Patient Class: Observation       References:    [facility] Bed Placement Criteria    [facility] Bed Placement Criteria    [facility] Bed Placement Criteria    [facility] Bed Placement Criteria    [facility] Bed Placement Criteria   Question Answer Comment   Admitting Physician Barbaraann Faster    Service: Medicine    Estimated Length of Stay < 2 midnights    Tentative Discharge Plan? Home or Self Care    Does patient need telemetry? No            Reason For Hospitalization:     Date Time: 04/13/21 6:15 PM  Patient Name: [name]  Attending Physician: Barbaraann Faster, MD        Assessment:   Abdominal wall abscess at PD catheter site.  End-stage renal disease on dialysis, missed dialysis for 9 days  Fluid overloading, dyspnea and hypoxia  Chronic respiratory failure on home oxygen/COPD  Thrombocytopenia  Diabetes mellitus  Hypertension  Hyperlipidemia  COPD  Obstructive sleep apnea  Full code     Plan:   Admit for inpatient medicine.  Follow cultures.  Received cefepime and vancomycin in Flagyl in ER, will start Zosyn.  Infectious diseases consultation.  Surgery consultation requested from ER.  Resume long-acting and short insulin, sliding scale coverage.  Resume amlodipine, carvedilol, aspirin, Lipitor.  Resume gabapentin.  Phosphorous binder   Symptomatic treatment  DVT prophylaxis, Hold Heparin   Discharge planning when stable  Labs and data reviewed.  Discussed with patient and nurse  Discussed with ER physician.  Nephrology consultation requested from ER, patient seen in dialysis center.  ID consultation requested.     Chief Complaint:   Drainage and bleeding from abdominal wall started early morning    History of Presenting Illness:   [name] is a 66 y.o. female   with history of end-stage renal disease  used to be on peritoneal dialysis which was stopped about 2 years ago after patient had been noticed with infections at PD catheter site.  Patient is on hemodialysis she presented to emergency room with bleeding and drainage from the abdominal wall noted early morning.  She has been missing dialysis for 9 days stated that she was getting chickenpox which is healing although patient does not have any skin lesions.  She was complaining of shortness of breath patient is on home oxygen 2 L via nasal cannula.    PMH:  has a past medical history of Chronic obstructive pulmonary disease, Diabetes mellitus, Hyperlipidemia, Hypertension, and Sleep apnea (2018).    Vitals: T 97.3, HR 77, RR 20, BP 181/94,     Wt Readings from Last 3 Encounters:   04/13/21 110.9 kg (244 lb 7.8 oz)   03/05/21 107.4 kg (236 lb 12.4 oz)   02/08/21 98.6 kg (217 lb 4.8 oz)       EKG:     Abnormal Labs:  Platelets 90, Glucose 523, BUN 69.0, Creatanine 10.2(5.4 one month ago), Na 134, Ca 8.0, Protein total 5.9, Albumin 2.5, Alk Phos 229, Albumin/Globulin ratio 0.7, Anion Gap 16.0, EGFR 4.6,     Urine:  Radiologic Exams:    XR Chest  AP Portable    Result Date: 04/13/2021   Stable cardiac enlargement without radiographic evidence of acute edema nor pneumonia at this time. Isabelle Course, MD 04/13/2021 4:34 PM    CT Abdomen Pelvis WO IV/ WO PO Cont    Result Date: 04/13/2021  1.Focal 3.5 x 3.2 x 3.2 cm fluid collection with surrounding fat stranding and a focus of gas extending from the abdominal wall to the skin surface, likely the site of the prior peritoneal dialysis catheter. Findings likely represent active inflammation or a developing abscess. Clinical correlation is recommended. 2.Extensive diffuse anasarca. Mild abdominal ascites. Trace bilateral pleural effusions. 3.Nonspecific left inguinal lymphadenopathy, possibly reactive. 4.Cholelithiasis. 5.Small hiatal hernia. 6.Leiomyomatous uterus. 7.Cardiomegaly. Cynda Familia, MD 04/13/2021 4:17 PM         ED meds:    Date/Time Order Dose Route Action Action by Comments    04/13/2021 1802 EDT cefepime (MAXIPIME) injection 1 g -- Intravenous Canceled Entry Barbaraann Faster, MD Automatically canceled at discontinue of medication order       Admit to Inpatient on     Treatment Plan:    Scheduled Meds:  Current Facility-Administered Medications   Medication Dose Route Frequency    amLODIPine  10 mg Oral Daily    aspirin  81 mg Oral Daily    atorvastatin  40 mg Oral Daily    carvedilol  25 mg Oral Q12H [facility]    dextromethorphan-guaiFENesin  1 tablet Oral Q12H    gabapentin  100 mg Oral Q8H [facility]    insulin glargine  25 Units Subcutaneous QHS    insulin lispro  1-4 Units Subcutaneous QHS    insulin lispro  1-8 Units Subcutaneous TID AC    insulin lispro  10 Units Subcutaneous Once    pantoprazole  40 mg Oral QAM AC    piperacillin-tazobactam  2.25 g Intravenous Q8H    sevelamer  800 mg Oral BID Meals    vancomycin  1,000 mg Intravenous Once    vancomycin   Intravenous See Admin Instructions       Continuous Infusions:    PRN Meds:.acetaminophen, albuterol sulfate HFA, dextrose **OR** dextrose **OR** dextrose **OR** glucagon (rDNA), diphenhydrAMINE, melatonin, morphine, naloxone, ondansetron **OR** ondansetron, oxyCODONE-acetaminophen, petrolatum, sodium chloride, sodium chloride    =======Day 2=========    Vitals: Temp:  [97.3 F (36.3 C)-98.2 F (36.8 C)] 97.7 F (36.5 C)  Heart Rate:  [60-82] 78  Resp Rate:  [16-20] 18  BP: (72-197)/(38-98) 72/57    Abnormal Labs:  H/H 11.3/35.1, Platelets 82, BUN 42.0, Creatanine 7.3, Glucose 178, Ca 7.5,     Testing/Procedures:      Continue Plan of Care: Date Time: 04/14/21 6:12 AM  Patient Name: [name], [name]  Patient status: Inpatient  Hospital Day: 1        Assessment:   Abdominal wall abscess at PD catheter site.  ESRD On HD ,Missed HD for 1 Week  Fluid overloading, dyspnea and hypoxia  Chronic respiratory failure on home oxygen/COPD  Thrombocytopenia  Diabetes  mellitus  Hypertension  Hyperlipidemia  Diabetic neuropathy  COPD  Obstructive sleep apnea  Severe obesity BMI 44.7  Full code   P  an:      Continue Zosyn, follow culture.  ID consulted.  Surgery consultation requested.  Continue dialysis per schedule.  Hypoxia improved use oxygen as needed patient on home oxygen.  Monitor platelet count no active bleeding no heparin.  Blood sugar improved on Lantus sliding  scale coverage.  BP controlled on amlodipine.  On aspirin, statin.  On gabapentin  Respiratory status stable, symptomatic treatment  Supportive care  DVT prophylaxis  _______________________________________________________________________________________________    This clinical review is based on/compiled from documentation provided by the treatment team within the patient's medical record.  [place] Reviewer PRN  [address]  [place], [place] [address]  Phone [phone]  Fax [phone]  [email or web address] .[place] [employer] ID [id number]  [facility]  NPI [id number]  [place]  NPI [id number]  Suzan Garibaldi  NPI [id number]  [place]  NPI [id number]  [place]. Lynnae Sandhoff  NPI [id number]

## 2021-04-14 NOTE — Progress Notes (Signed)
UF of 3 liters. Tolerated well. No sob noted. Lungs clear. 1+ leg edema remains. No c/o pain or itching. Culture of drainage from old pd catheter site sent. LAF worked well. Hemostasis achieved. Report called to [facility] RN   04/14/21 1500   Treatment Summary   Time Off Machine 1502   Duration of Treatment (Hours) 4   Treatment Type 2:1   Dialyzer Clearance Moderately streaked   Fluid Volume Off (mL) 3400   Prime Volume (mL) 200   Rinseback Volume (mL) 200   Fluid Given: Normal Saline (mL) 0   Fluid Given: PRBC  0 mL   Fluid Given: Albumin (mL) 0   Fluid Given: Other (mL) 0   Total Fluid Given 400   Hemodialysis Net Fluid Removed 3000   Post Treatment Assessment   Post-Treatment Weight (Kg) 106.8   Patient Response to Treatment TOLERATED WELL   Vitals   Temp 97.7 F (36.5 C)   Heart Rate 65   Resp Rate 18   BP (!) 152/36   O2 Device None (Room air)   Assessment   Mental Status Alert;Oriented   Cardiac (WDL) WDL   Respiratory  WDL   Respiratory Pattern Regular;Easy   Bilateral Breath Sounds Clear;Diminished   Edema  WDL   RLE Edema +1   LLE Edema +1   General Skin Color Appropriate for ethnicity   Skin Condition/Temp Warm;Dry   Gastrointestinal (WDL) WDL   Abdomen Inspection Soft;Rounded   GI Symptoms None   Mobility Bed   Pain Assessment   Charting Type Reassessment   Pain Scale Used Numeric Scale (0-10)   Numeric Pain Scale   Pain Score 0   POSS Score 1   Education   Person taught Patient   Knowledge basis Substantial   Topics taught Fluid Management;Procedure   Teaching Tools Explain   Reponse Verbalizes Understanding   Bedside Nurse Communication   Name of bedside RN - post dialysis [facility] RN

## 2021-04-14 NOTE — Consults (Signed)
INITIAL HEMATOLOGY CONSULT     Dr. 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  Abdominal wall abscess at PD catheter site.   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  Chronic respiratory failure on home oxygen  Metabolic acidosis 2/2 missed HD  Uncontrolled DM-Type 2 with hyperglycemia - BG~ 500  Pseudohyponatremia 2/2 hyperglycemia  Hyperlipidemia   Diabetic neuropathy  COPD  Obstructive sleep apnea  Severe obesity  Protein energy malnutrition  Elevated ALK phos    Plan:    Platelets are above threshold of intervention  Monitor H/H and Platelets daily  Continue iron repletion with dialysis and ESA therapy  Continue antibiotics   Continue home meds for comorbid conditions        Cindi Carbon, MD    04/14/2021  2:57 PM       HISTORY OF PRESENT ILLNESS     [name] is 66 y.o. female  with PMHx of hypertension, hyperlipidemia, diabetes, OSA not compliant with CPAP, ESRD-DD.    Remote history  Dec 27, Jan 22, 2020: Patient was admitted in hospital with shortness of breath and wrist pain. Lab demonstrate WBC 4.02, hemoglobin 7.8, hematocrit 26.8, MCV 100.0, platelet 135, Creatinine 5.7, calcium 8.1. She had evidence of fluid overload on imaging studies. She was seen by nephrology group Dr. Elisabeth Cara and associates and underwent hemodialysis and supplemental oxygen. She had missed dialysis a couple of times which resulted in her admission to the hospital.    Sep 28 to Oct 25, 2020: Patient was admitted in hospital with fall and left hip pain. Patient reports that she fell yesterday  while trying to get out of bed. She struck the left side of her body and her head but did not lose consciousness. She was able to get herself up and walk despite experiencing constant sharp left hip pain shoulder pain. She also noticed puffiness of her face and increased shortness of breath since this morning. At the ED her BP was elevated at 200/83. Review of blood work was significant for H/H of 9.8/31.2, platelets 65, troponin 0.42, BNP 2878, fingersticks 476. EKG shows NSR with PAC and mild ST depression in V5 and V6. X-ray of the left hip showed no radiographically apparent displaced fracture is present. Chest x-ray chest showed no significant change of diffuse interstitial markings suggestive of pulmonary vascular congestion. Seen by Dr. Gaylyn Lambert (Nephrology) for ESRD on HD MWF at [facility]- reported last HD on Wednesday. Mild hyperkalemia. Metabolic acidosis due to ESRD. Anemia of CKD. Plan for HD again. Hold off on EPO as BP very high. Oct 21, 2020: Seen by Dr. Lucious Groves (Physician) for pulmonary edema, elevated troponin, severe left wrist pain, thrombocytopenia. Bilateral lower extremity edema. Recommended for CT scan of the left wrist and forearm. Ultrasound Doppler. Add lantus 20 units and lispro 5units.    Sep 29 to Oct 25, 2020: Seen by me for Chronic Microcytic Anemia likely due to CKD vs endocrinopathy with superimposed acute inflammation - improving. Thrombocytopenia (persistently dropping since 07/2019), likely ITP- improving. ESRD  on HD MWF at [facility]. Peripheral neuropathy. Diabetes mellitus- uncontrolled.         Jan 13 to Feb 08, 2021: Patient was admitted in hospital with dyspnea and hypertensive emergency. She had missed her dialysis and developed dyspnea over the preceding 2 days and presented to the emergency room on presentation she was found to have markedly elevated blood pressure readings consistent with hypertensive emergency as well as fluid overload on  x-ray. Patient was seen in consultation by nephrology and cardiology she underwent dialysis and ultrafiltration for 4 conservative days before she returned close to her dry weight. Course was also complicated by cough productive of sputum she was treated with IV antibiotics beta agonist and anticholinergics by nebulizer supplemental oxygen and mucolytic agents.    Feb 09 to Mar 06, 2021: Patient was admitted in hospital with shortness of breath for 1 week. Patient had missed dialysis for almost 1 week. Patient had hyperkalemia elevated blood pressure blood sugar and was admitted to intensive care unit for urgent dialysis and immediate care in higher level in hospital. Patient had dialysis fluid balance was improved oxygenation was improved hyperkalemia was improved she was resumed on home medication and was given as needed medicines for elevated blood pressure. She was started on sliding scale coverage, Lantus. She was initially monitored in ICU subsequently transferred to medical floor patient had been having her dialysis per schedule. Her fluid overloading improved oxygenation improved currently she is off oxygen electrolytes got corrected.      Current history     April 13, 2021: Patient was admitted in hospital with bleeding and drainage from the abdominal wall noted since morning. She has been missing dialysis for 9 days stated that she was getting chickenpox which was healing although patient does not have any skin lesions. She was complaining of shortness of breath patient is on home oxygen 2 L via nasal cannula.    Work-up in emergency room showed elevated phosphorus 6.8 blood sugar was elevated 452, labs showed blood sugar 523 BUN 69 creatinine 10.2 sodium 134 alkaline phosphatase 229. She had an anion gap 16. CBC shows normal white cell count H&H is 12.2/38.7, platelet count 90. Hep C and B were non-reactive.     CT abdomen and pelvis showed focal 3.5 x 3.2 x 3.2 cm fluid collection with surrounding fat  stranding and a focus of gas extending from the abdominal wall to the skin surface, likely the site of the prior peritoneal dialysis catheter. Findings likely represent active inflammation or a developing abscess. Extensive diffuse anasarca. Mild abdominal ascites. Trace bilateral pleural effusions. Nonspecific left inguinal lymphadenopathy, possibly reactive. Cholelithiasis. Small hiatal hernia. Leiomyomatous uterus. Cardiomegaly.    Nephrology consult for ESRD on dialysis, peritoneal dialysis catheter dysfunction, Upper GI bleed. Recommended continue HD. Resume sevelamer 800mg  TIB.     Infectious disease consult for abdominal wall abscess. Continue vancomycin and zosyn.     General surgery consult for PD catheter site abscess.                PAST HISTORY        Primary Care Provider: Horton Marshall, MD        PMH/PSH:    Past Medical History:   Diagnosis Date    Chronic obstructive pulmonary disease     Diabetes mellitus     Hyperlipidemia     Hypertension     Sleep apnea 2018     Past Surgical History:   Procedure Laterality Date  EGD, BIOPSY N/A 05/14/2019    Procedure: EGD, BIOPSY;  Surgeon: Ronie Spies, MD;  Location: [facility];  Service: Gastroenterology;  Laterality: N/A;    EXPLORATORY LAPAROTOMY N/A 05/27/2019    Procedure: EXPLORATORY LAPAROTOMY;  Surgeon: Herbert Moors., MD;  Location: [facility];  Service: General;  Laterality: N/A;    JOINT REPLACEMENT      shoulder    JOINT REPLACEMENT      knee    OVARY SURGERY Left     Removal    REMOVAL, FOREIGN BODY N/A 05/27/2019    Procedure: REMOVAL, PERITONEAL DIALYSIS CATHETER;  Surgeon: Herbert Moors., MD;  Location: [facility];  Service: General;  Laterality: N/A;    TUNNELED CATH CHECK/CHANGE (PERMCATH) N/A 07/03/2019    Procedure: TUNNELED CATH CHECK/CHANGE;  Surgeon: Maureen Ralphs, MD;  Location: [facility];  Service: Interventional Radiology;  Laterality: N/A;    TUNNELED CATH PLACEMENT (PERMCATH) Bilateral 05/23/2019     Procedure: TUNNELED CATH PLACEMENT;  Surgeon: Jacques Earthly, MD;  Location: [facility];  Service: Interventional Radiology;  Laterality: Bilateral;    TUNNELED CATH PLACEMENT (PERMCATH) Right 07/31/2019    Procedure: TUNNELED CATH PLACEMENT;  Surgeon: Jacques Earthly, MD;  Location: [facility];  Service: Interventional Radiology;  Laterality: Right;       Social/Family History:    Family History   Problem Relation Age of Onset    Diabetes Mother     Hypertension Mother     Diabetes Father     Hypertension Father     Diabetes Sister     Hypertension Sister     Diabetes Brother     Hypertension Brother     Diabetes Paternal Aunt     Diabetes Paternal Uncle      Social History     Socioeconomic History    Marital status: Legally Separated   Tobacco Use    Smoking status: Never    Smokeless tobacco: Never   Vaping Use    Vaping status: Never Used   Substance and Sexual Activity    Alcohol use: Never    Drug use: Never    Sexual activity: Not Currently       Allergies: She is allergic to lisinopril and metformin.             OBJECTIVE       Visit Vitals  BP 137/54   Pulse (!) 52   Temp 97.7 F (36.5 C)   Resp 18   Ht 1.575 m (5\' 2" )   Wt 110.9 kg (244 lb 7.8 oz)   LMP  (LMP Unknown)   SpO2 100%   BMI 44.72 kg/m       Body mass index is 44.72 kg/m.      Intake/Output Summary (Last 24 hours) at 04/14/2021 1457  Last data filed at 04/14/2021 0750  Gross per 24 hour   Intake 130 ml   Output 3650 ml   Net -3520 ml        REVIEW OF SYSTEMS     Constitutional: no fever, chills  Eye: no recent visual problems, or pain  ENMT: no ear pain, nasal congestion or sore throat  Respiratory: Positive for shortness of breath, no cough  Cardiovascular: no Chest pain, palpitations or syncope  Gastrointestinal: Positive for Abdominal Pain, no nausea, vomiting or diarrhea  Genitourinary: no frequency, dysuria, pneumaturia or hematuria  Hema/Lymph: no bruising tendency or swollen lymph glands  Endocrine: no excessive thirst or  excessive hunger  Musculoskeletal: no back pain, neck pain, joint pain, or decreased range of motion  Integumentary: no rash, pruritus or abrasions  Neurologic: no headache, confusion, numbness, weakness, seizures, or gait disturbance  Psychiatric: no anxiety, depression, or suicidal thoughts     PHYSICAL EXAM     General: Obesity, no acute distress.  Skin: warm and dry, no rashes or lesions.  Eye: PERRL, EOMI, normal conjunctiva, vision grossly normal  HENT: normocephalic, atraumatic, normal gross hearing, moist oral mucosa, no scleral icterus, no sinus tenderness, no nasal discharge  Neck: supple, non-tender, no JVD, no lymphadenopathy.  Lungs: clear to auscultation, non-labored respiration.  Chest Wall: no deformity  Heart: normal rate, regular rhythm, no murmur.  Abdomen: soft, non-tender, no guarding or rebound, non-distended, normal bowel sounds, no masses. PD site noted with erythema.  Musculoskeletal: normal range of motion and strength, no tenderness and swelling  Lymphatic: no edema, no clubbing   Neurologic: awake, alert, and oriented X3, CN II-XII grossly intact, normal and symmetric strength and sensation, non-focal exam.  Psychiatric: cooperative, appropriate mood and affect.     MEDICATIONS      Scheduled Meds: PRN Meds:    amLODIPine, 10 mg, Oral, Daily  aspirin, 81 mg, Oral, Daily  atorvastatin, 40 mg, Oral, Daily  carvedilol, 25 mg, Oral, Q12H [facility]  dextromethorphan-guaiFENesin, 1 tablet, Oral, Q12H  gabapentin, 100 mg, Oral, Q8H [facility]  insulin glargine, 25 Units, Subcutaneous, QHS  insulin lispro, 1-4 Units, Subcutaneous, QHS  insulin lispro, 1-8 Units, Subcutaneous, TID AC  insulin lispro, 10 Units, Subcutaneous, Once  lactobacillus/streptococcus, 1 capsule, Oral, Daily  pantoprazole, 40 mg, Oral, QAM AC  piperacillin-tazobactam, 2.25 g, Intravenous, Q8H  sevelamer, 800 mg, Oral, BID Meals  vancomycin, 1,000 mg, Intravenous, Once  vancomycin, , Intravenous, See Admin Instructions          acetaminophen, 500 mg, Q6H PRN  albuterol sulfate HFA, 2 puff, Q6H PRN  dextrose, 15 g of glucose, PRN   Or  dextrose, 12.5 g, PRN   Or  dextrose, 12.5 g, PRN   Or  glucagon (rDNA), 1 mg, PRN  diphenhydrAMINE, 6.25 mg, Q6H PRN  melatonin, 3 mg, QHS PRN  morphine, 2 mg, Q4H PRN  naloxone, 0.2 mg, PRN  ondansetron, 4 mg, Q8H PRN   Or  ondansetron, 4 mg, Q8H PRN  oxyCODONE-acetaminophen, 1 tablet, Q4H PRN  petrolatum, , PRN           Home Meds:      Home Medications       Med List Status: Complete Set By: Charolotte Capuchin, RN at 04/13/2021 10:04 PM              acetaminophen (TYLENOL) 500 MG tablet     Take 500 mg by mouth every 6 (six) hours as needed for Pain     albuterol sulfate HFA (PROVENTIL) 108 (90 Base) MCG/ACT inhaler     Inhale 2 puffs into the lungs every 6 (six) hours as needed for Wheezing or Shortness of Breath     amLODIPine (NORVASC) 10 MG tablet     Take 1 tablet by mouth daily     aspirin 81 MG chewable tablet     Chew 81 mg by mouth daily     dextromethorphan-guaiFENesin (MUCINEX DM) 30-600 MG per 12 hr tablet     Take 1 tablet by mouth every 12 (twelve) hours     gabapentin (NEURONTIN) 100 MG capsule     Take 1 capsule (100 mg)  by mouth every 8 (eight) hours     insulin aspart (NovoLOG) 100 UNIT/ML injection     Inject 15 Units into the skin 3 (three) times daily before meals     Insulin Degludec (TRESIBA SC)     Inject 35 Unit into the skin     pantoprazole (PROTONIX) 40 MG tablet     Take 40 mg by mouth daily     petrolatum (AQUAPHOR) ointment     Apply topically every 12 (twelve) hours     sevelamer (RENVELA) 800 MG tablet     Take 800 mg by mouth 2 (two) times daily with meals                         I personally reviewed all of the medications       LABS     Recent Labs   Lab 04/14/21  0341 04/13/21  1526   Glucose 178* 523*   BUN 42.0* 69.0*   Creatinine 7.3* 10.2*   EGFR 6.8 4.6   Calcium 7.5* 8.0*   Sodium 139 134*   Potassium 3.5 4.2   Chloride 104 99   CO2 25 19   Albumin  --  2.5*    Phosphorus  --  6.8*   AST (SGOT)  --  18   ALT  --  23   Bilirubin, Total  --  0.6   Alkaline Phosphatase  --  229*     Recent Labs   Lab 04/14/21  0341 04/13/21  1526   WBC 4.05 5.06   Hgb 11.3* 12.2   Hematocrit 35.1 38.7   MCV 90.2 90.0   MCH 29.0 28.4   MCHC 32.2 31.5   Platelets 82* 90*     Band Neutrophils Absolute   Date Value Ref Range Status   01/05/2020 0.00 0.00 - 1.00 x10 3/uL Final       Iron   Date Value Ref Range Status   02/05/2021 31 (L) 40 - 145 ug/dL Final   10/22/2020 41 40 - 145 ug/dL Final     TIBC   Date Value Ref Range Status   02/05/2021 171 (L) 265 - 497 ug/dL Final   10/22/2020 166 (L) 265 - 497 ug/dL Final     Ferritin   Date Value Ref Range Status   02/05/2021 613.50 (H) 4.60 - 204.00 ng/mL Final   12/31/2019 3,158.40 (H) 4.60 - 204.00 ng/mL Final     Folate   Date Value Ref Range Status   02/05/2021 6.2 See below ng/mL Final     Comment:     Deficient    : <3.5 ng/mL  Intermediate : 3.5 - 5.4 ng/mL  Normal       : Greater Than 5.4 ng/mL     10/22/2020 7.2 See below ng/mL Final     Comment:     Deficient    : <3.5 ng/mL  Intermediate : 3.5 - 5.4 ng/mL  Normal       : Greater Than 5.4 ng/mL       Vitamin B-12   Date Value Ref Range Status   02/05/2021 937 (H) 211 - 911 pg/mL Final   10/22/2020 674 211 - 911 pg/mL Final     Haptoglobin   Date Value Ref Range Status   10/23/2020 225 35 - 250 mg/dL Final     LDH   Date Value Ref Range Status   10/22/2020 114 (L) 120 -  331 U/L Final     Reticulocyte Count Automated   Date Value Ref Range Status   10/23/2020 1.7 0.8 - 2.3 % Final     Reticulocyte Count Absolute   Date Value Ref Range Status   10/23/2020 0.0593 0.0220 - 0.1420 x10 6/uL Final       No results for input(s): PTT, PT, INR in the last 72 hours.        Microbiology:  Microbiology Results (last 15 days)       Procedure Component Value Units Date/Time    Culture + Gram Lenise Arena Wound [[id number]]     Order Status: Sent Specimen: Wound     Culture + Gram Lenise Arena Wound  [[id number]] Collected: 04/14/21 1201    Order Status: Sent Specimen: Wound Updated: 04/14/21 1201    COVID-19 (SARS-CoV-2) and Influenza A/B, NAA ([facility] Rapid)- Admission [[id number]] Collected: 04/13/21 1529    Order Status: Completed Specimen: Culturette from Nasopharyngeal Updated: 04/13/21 1615     Purpose of COVID testing Diagnostic -[facility]     SARS-CoV-2 Specimen Source Nasal Swab     SARS CoV 2 Overall Result Not Detected     Comment: __________________________________________________  -A result of "Detected" indicates POSITIVE for the    presence of SARS CoV-2 RNA  -A result of "Not Detected" indicates NEGATIVE for the    presence of SARS CoV-2 RNA  __________________________________________________________  Test performed using the Roche cobas Liat SARS-CoV-2 assay. This assay is  only for use under the Food and Drug Administration's Emergency Use  Authorization. This is a real-time RT-PCR assay for the qualitative  detection of SARS-CoV-2 RNA. Viral nucleic acids may persist in vivo,  independent of viability. Detection of viral nucleic acid does not imply the  presence of infectious virus, or that virus nucleic acid is the cause of  clinical symptoms. Negative results do not preclude SARS-CoV-2 infection and  should not be used as the sole basis for diagnosis, treatment or other  patient management decisions. Negative results must be combined with  clinical observations, patient history, and/or epidemiological information.  Invalid results may be due to inhibiting substances in the specimen and  recollection should occur. Please see Fact Sheets for patients and providers  located:  https://www.benson-chung.com/          Influenza A Not Detected     Influenza B Not Detected     Comment: Test performed using the Roche cobas Liat SARS-CoV-2 & Influenza A/B assay.  This assay is only for use under the Food and Drug Administration's  Emergency Use Authorization. This is a multiplex real-time RT-PCR  assay  intended for the simultaneous in vitro qualitative detection and  differentiation of SARS-CoV-2, influenza A, and influenza B virus RNA. Viral  nucleic acids may persist in vivo, independent of viability. Detection of  viral nucleic acid does not imply the presence of infectious virus, or that  virus nucleic acid is the cause of clinical symptoms. Negative results do  not preclude SARS-CoV-2, influenza A, and/or influenza B infection and  should not be used as the sole basis for diagnosis, treatment or other  patient management decisions. Negative results must be combined with  clinical observations, patient history, and/or epidemiological information.  Invalid results may be due to inhibiting substances in the specimen and  recollection should occur. Please see Fact Sheets for patients and providers  located: http://olson-hall.info/.         Narrative:      o Collect and clearly label specimen  type:  o PREFERRED-Upper respiratory specimen: One Nasal Swab in  Transport Media.  o Hand deliver to laboratory ASAP  Diagnostic -[facility]    Culture Blood Aerobic and Anaerobic [[id number]] Collected: 04/13/21 1529    Order Status: Sent Specimen: Arm from Blood, Venipuncture Updated: 04/13/21 2003    Narrative:      1 BLUE+1 PURPLE    Culture Blood Aerobic and Anaerobic [[id number]] Collected: 04/13/21 1529    Order Status: Sent Specimen: Arm from Blood, Venipuncture Updated: 04/13/21 2003    Narrative:      1 BLUE+1 PURPLE             IMAGING     Diagnostic Imaging:  XR Chest  AP Portable    Result Date: 04/13/2021   Stable cardiac enlargement without radiographic evidence of acute edema nor pneumonia at this time. Isabelle Course, MD 04/13/2021 4:34 PM    CT Abdomen Pelvis WO IV/ WO PO Cont    Result Date: 04/13/2021  1.Focal 3.5 x 3.2 x 3.2 cm fluid collection with surrounding fat stranding and a focus of gas extending from the abdominal wall to the skin surface, likely the site of the prior peritoneal dialysis  catheter. Findings likely represent active inflammation or a developing abscess. Clinical correlation is recommended. 2.Extensive diffuse anasarca. Mild abdominal ascites. Trace bilateral pleural effusions. 3.Nonspecific left inguinal lymphadenopathy, possibly reactive. 4.Cholelithiasis. 5.Small hiatal hernia. 6.Leiomyomatous uterus. 7.Cardiomegaly. Cynda Familia, MD 04/13/2021 4:17 PM

## 2021-04-14 NOTE — Plan of Care (Signed)
NURSING SHIFT NOTE     Patient: [name]  Day: 1      SHIFT EVENTS     Shift Narrative/Significant Events (PRN med administration, fall, RRT, etc.):     Pt is new admission from Dialysis admitted for volume overload. Pt is alert and oriented x4, Room Air, Lung sounds clear, VSS. Pt took all scheduled medications. Pt had c/o 8/10 abd pain, prn morphine given. Pt had n/v, prn zofran given. Pt c/o of itchiness, prn benadryl given. Pt had no c/o dizziness. BG checks, scheduled insulin given. Pt educated of plan of care. IV abx given. Skin care completed. Abd dressing CDI. Isolation precautions maintained Bed in the lowest position. Fall and Safety Bundle in place. Call bell within reach, Purposeful rounding completed, will ct to monitor.             ASSESSMENT     Changes in assessment from patient's baseline this shift:    Neuro: No  CV: No  Pulm: No  Peripheral Vascular: No  HEENT: No  GI: No  BM during shift: No, Last BM: Last BM Date: 04/12/21  GU: No   Integ: No  MS: No    Pain: Improved  Pain Interventions: Medications  Medications Utilized: morphine intravenous    Mobility: PMP Activity: Step 3 - Bed Mobility of Distance Walked (ft) (Step 6,7): 0 Feet           Lines     Patient Lines/Drains/Airways Status       Active Lines, Drains and Airways       Name Placement date Placement time Site Days    Peripheral IV 04/13/21 20 G Right Antecubital 04/13/21  1500  Antecubital  less than 1    External Urinary Catheter 04/13/21  2045  --  less than 1    Graft/Fistula Left --  --  -- --                         VITAL SIGNS     Vitals:    04/13/21 2337   BP: 140/69   Pulse: 66   Resp: 18   Temp: 97.7 F (36.5 C)   SpO2: 98%       Temp  Min: 97.3 F (36.3 C)  Max: 98.2 F (36.8 C)  Pulse  Min: 66  Max: 82  Resp  Min: 16  Max: 20  BP  Min: 123/73  Max: 197/91  SpO2  Min: 90 %  Max: 100 %      Intake/Output Summary (Last 24 hours) at 04/14/2021 0053  Last data filed at 04/13/2021 2000  Gross per 24 hour   Intake --    Output 3100 ml   Net -3100 ml          CARE PLAN     Problem: Pain interferes with ability to perform ADL  Goal: Pain at adequate level as identified by patient  Outcome: Progressing  Flowsheets (Taken 04/14/2021 0052)  Pain at adequate level as identified by patient:   Assess for risk of opioid induced respiratory depression, including snoring/sleep apnea. Alert healthcare team of risk factors identified.   Identify patient comfort function goal   Assess pain on admission, during daily assessment and/or before any "as needed" intervention(s)   Reassess pain within 30-60 minutes of any procedure/intervention, per Pain Assessment, Intervention, Reassessment (AIR) Cycle   Evaluate if patient comfort function goal is met   Offer non-pharmacological pain  management interventions   Evaluate patient's satisfaction with pain management progress     Problem: Side Effects from Pain Analgesia  Goal: Patient will experience minimal side effects of analgesic therapy  Outcome: Progressing  Flowsheets (Taken 04/14/2021 0052)  Patient will experience minimal side effects of analgesic therapy:   Monitor/assess patient's respiratory status (RR depth, effort, breath sounds)   Assess for changes in cognitive function   Prevent/manage side effects per LIP orders (i.e. nausea, vomiting, pruritus, constipation, urinary retention, etc.)     Problem: Safety  Goal: Patient will be free from injury during hospitalization  Outcome: Progressing  Flowsheets (Taken 04/14/2021 0052)  Patient will be free from injury during hospitalization:   Assess patient's risk for falls and implement fall prevention plan of care per policy   Use appropriate transfer methods   Ensure appropriate safety devices are available at the bedside   Provide and maintain safe environment   Include patient/ family/ care giver in decisions related to safety   Hourly rounding   Assess for patients risk for elopement and implement Elopement Risk Plan per policy  Goal: Patient  will be free from infection during hospitalization  Outcome: Progressing  Flowsheets (Taken 04/14/2021 0052)  Free from Infection during hospitalization:   Assess and monitor for signs and symptoms of infection   Monitor lab/diagnostic results   Monitor all insertion sites (i.e. indwelling lines, tubes, urinary catheters, and drains)

## 2021-04-14 NOTE — Plan of Care (Signed)
NURSING SHIFT NOTE     Patient: [name]  Day: 0      SHIFT EVENTS     Shift Narrative/Significant Events (PRN med administration, fall, RRT, etc.):     Pt is alert and oriented x4, Room Air, Lung sounds clear, VSS. Pt took all scheduled medications. Pt had c/o of abd 8/10 pain, prn morphine given w/ relief. Pt had no c/o dizziness. BG checks, coverage given. Pt educated of plan of care. IV abx given. Skin care completed. Abd dressing CDI. Isolation precautions maintained. Bed in the lowest position. Fall and Safety Bundle in place. Call bell within reach, Purposeful rounding completed, will ct to monitor.          ASSESSMENT     Changes in assessment from patient's baseline this shift:    Neuro: No  CV: No  Pulm: No  Peripheral Vascular: No  HEENT: No  GI: No  BM during shift: Yes   , Last BM: 04/14/2021  GU: No   Integ: No  MS: No    Pain: None  Pain Interventions: None  Medications Utilized: n/a    Mobility: PMP Activity: Step 3 - Bed Mobility of Distance Walked (ft) (Step 6,7): 0 Feet           Lines     Patient Lines/Drains/Airways Status       Active Lines, Drains and Airways       Name Placement date Placement time Site Days    Peripheral IV 04/14/21 22 G Right;Posterior Hand 04/14/21  1914  Hand  less than 1    External Urinary Catheter 04/13/21  2045  --  1    Graft/Fistula Left --  --  -- --                         VITAL SIGNS     Vitals:    04/14/21 2033   BP: 132/67   Pulse: 70   Resp:    Temp:    SpO2:        Temp  Min: 97.7 F (36.5 C)  Max: 98.6 F (37 C)  Pulse  Min: 52  Max: 78  Resp  Min: 18  Max: 18  BP  Min: 72/57  Max: 159/47  SpO2  Min: 93 %  Max: 100 %      Intake/Output Summary (Last 24 hours) at 04/14/2021 2317  Last data filed at 04/14/2021 1500  Gross per 24 hour   Intake 130 ml   Output 3550 ml   Net -3420 ml          CARE PLAN     Problem: Moderate/High Fall Risk Score >5  Goal: Patient will remain free of falls  Outcome: Progressing  Flowsheets (Taken 04/14/2021 1940)  High  (Greater than 13):   HIGH-Consider use of low bed   HIGH-Initiate use of floor mats as appropriate   HIGH-Apply yellow "Fall Risk" arm band   HIGH-Utilize chair pad alarm for patient while in the chair   HIGH-Bed alarm on at all times while patient in bed   HIGH-Visual cue at entrance to patient's room   MOD-Place Fall Risk level on whiteboard in room     Problem: Compromised Tissue integrity  Goal: Damaged tissue is healing and protected  Outcome: Progressing  Flowsheets (Taken 04/14/2021 2316)  Damaged tissue is healing and protected:   Monitor/assess Braden scale every shift   Provide wound care per  wound care algorithm   Reposition patient every 2 hours and as needed unless able to reposition self   Increase activity as tolerated/progressive mobility   Relieve pressure to bony prominences for patients at moderate and high risk   Avoid shearing injuries   Keep intact skin clean and dry     Problem: Safety  Goal: Patient will be free from injury during hospitalization  Outcome: Progressing  Flowsheets (Taken 04/14/2021 2316)  Patient will be free from injury during hospitalization:   Assess patient's risk for falls and implement fall prevention plan of care per policy   Provide and maintain safe environment   Use appropriate transfer methods   Ensure appropriate safety devices are available at the bedside   Include patient/ family/ care giver in decisions related to safety   Hourly rounding

## 2021-04-14 NOTE — Progress Notes (Signed)
Arrived via bed. Drowsy, easily arousable. Oriented x3. Lungs clear and diminished. No sob noted. Bilateral lower leg edema 2+. Continues to c/o itching all over. No abdominal pain. Dressing to abdomen dry and intact. Good bruit and thrill in LAF. Report received from [facility]   04/14/21 1036   Dialysis Weight   Pre-Treatment Weight (Kg) 109.8   Scale Type ICU Bed Scale   Vitals   Temp 97.7 F (36.5 C)   Heart Rate 65   Resp Rate 18   BP 102/50   O2 Device None (Room air)   Assessment   Mental Status Alert;Oriented;Cooperative   Cardiac (WDL) WDL   Respiratory  WDL   Respiratory Pattern Regular;Easy   Bilateral Breath Sounds Clear;Diminished   Edema  X   RLE Edema +2   LLE Edema +2   General Skin Color Appropriate for ethnicity   Skin Condition/Temp Warm;Dry   Gastrointestinal (WDL) X   Abdomen Inspection Soft;Rounded   GI Symptoms None   Mobility Bed   Pain Assessment   Charting Type Assessment   Pain Scale Used Numeric Scale (0-10)   Numeric Pain Scale   Pain Score 0   POSS Score 2   Hemodialysis Comments   Pre-Hemodialysis Comments TIME OUT DONE

## 2021-04-14 NOTE — Consults (Signed)
SURGICAL CONSULTATION  Team General Surgery/[facility], [facility] [phone]    Date Time: 04/14/21 5:04 PM  Patient Name: [name]  Attending Physician: Barbaraann Faster, MD  Consulting Physician:  Dr. Kelle Darting  Reason for consultation: PD catheter site abscess    History of Present Illness:   [name] is a 66 y.o. female with a PMHx of COPD, DM, HTN, HLD, and CKD on dialysis who presents to the hospital with drainage from her abdominal wall near prior PD catheter site. She is now on HD. She denies fevers or chills. CT demonstrates a focal 3.5 x 3.2 x 3.2 cm fluid collection with surrounding fat stranding and a focus of gas extending from the abdominal wall to the skin surface, likely the site of the prior peritoneal dialysis catheter. Findings likely represent active inflammation or a developing abscess. Area is actively draining through small site    Past Medical History:     Past Medical History:   Diagnosis Date    Chronic obstructive pulmonary disease     Diabetes mellitus     Hyperlipidemia     Hypertension     Sleep apnea 2018       Past Surgical History:     Past Surgical History:   Procedure Laterality Date    EGD, BIOPSY N/A 05/14/2019    Procedure: EGD, BIOPSY;  Surgeon: Ronie Spies, MD;  Location: [facility];  Service: Gastroenterology;  Laterality: N/A;    EXPLORATORY LAPAROTOMY N/A 05/27/2019    Procedure: EXPLORATORY LAPAROTOMY;  Surgeon: Herbert Moors., MD;  Location: [facility];  Service: General;  Laterality: N/A;    JOINT REPLACEMENT      shoulder    JOINT REPLACEMENT      knee    OVARY SURGERY Left     Removal    REMOVAL, FOREIGN BODY N/A 05/27/2019    Procedure: REMOVAL, PERITONEAL DIALYSIS CATHETER;  Surgeon: Herbert Moors., MD;  Location: [facility];  Service: General;  Laterality: N/A;    TUNNELED CATH CHECK/CHANGE (PERMCATH) N/A 07/03/2019    Procedure: TUNNELED CATH CHECK/CHANGE;  Surgeon: Maureen Ralphs, MD;  Location: [facility];  Service: Interventional  Radiology;  Laterality: N/A;    TUNNELED CATH PLACEMENT (PERMCATH) Bilateral 05/23/2019    Procedure: TUNNELED CATH PLACEMENT;  Surgeon: Jacques Earthly, MD;  Location: [facility];  Service: Interventional Radiology;  Laterality: Bilateral;    TUNNELED CATH PLACEMENT (PERMCATH) Right 07/31/2019    Procedure: TUNNELED CATH PLACEMENT;  Surgeon: Jacques Earthly, MD;  Location: [facility];  Service: Interventional Radiology;  Laterality: Right;       Family History:     Family History   Problem Relation Age of Onset    Diabetes Mother     Hypertension Mother     Diabetes Father     Hypertension Father     Diabetes Sister     Hypertension Sister     Diabetes Brother     Hypertension Brother     Diabetes Paternal Aunt     Diabetes Paternal Uncle        Social History:     Social History     Socioeconomic History    Marital status: Legally Separated     Spouse name: Not on file    Number of children: Not on file    Years of education: Not on file    Highest education level: Not on file   Occupational History    Not on  file   Tobacco Use    Smoking status: Never    Smokeless tobacco: Never   Vaping Use    Vaping status: Never Used   Substance and Sexual Activity    Alcohol use: Never    Drug use: Never    Sexual activity: Not Currently   Other Topics Concern    Not on file   Social History Narrative    Not on file     Social Determinants of Health     Financial Resource Strain: Not on file   Food Insecurity: Not on file   Transportation Needs: Not on file   Physical Activity: Not on file   Stress: Not on file   Social Connections: Not on file   Intimate Partner Violence: Not on file   Housing Stability: Not on file       Allergies:     Allergies   Allergen Reactions    Lisinopril Hives    Metformin Hives       Medications:     Prior to Admission medications    Medication Sig Start Date End Date Taking? Authorizing Provider   acetaminophen (TYLENOL) 500 MG tablet Take 500 mg by mouth every 6 (six) hours as needed for  Pain    [provider]   albuterol sulfate HFA (PROVENTIL) 108 (90 Base) MCG/ACT inhaler Inhale 2 puffs into the lungs every 6 (six) hours as needed for Wheezing or Shortness of Breath    [provider]   amLODIPine (NORVASC) 10 MG tablet Take 1 tablet by mouth daily    [provider]   aspirin 81 MG chewable tablet Chew 81 mg by mouth daily    [provider]   dextromethorphan-guaiFENesin ([place] DM) 30-600 MG per 12 hr tablet Take 1 tablet by mouth every 12 (twelve) hours 02/08/21   Chinery, Lawerance Sabal, MD   gabapentin (NEURONTIN) 100 MG capsule Take 1 capsule (100 mg) by mouth every 8 (eight) hours 03/06/21   Barbaraann Faster, MD   insulin aspart (NovoLOG) 100 UNIT/ML injection Inject 15 Units into the skin 3 (three) times daily before meals    [provider]   Insulin Degludec (TRESIBA SC) Inject 35 Unit into the skin    [provider]   pantoprazole (PROTONIX) 40 MG tablet Take 40 mg by mouth daily    [provider]   petrolatum (AQUAPHOR) ointment Apply topically every 12 (twelve) hours 03/06/21   Barbaraann Faster, MD   sevelamer (RENVELA) 800 MG tablet Take 800 mg by mouth 2 (two) times daily with meals    [provider]       Review of Systems:   General ROS: negative  ENT ROS: negative  Cardiovascular ROS: negative  Respiratory ROS: negative  Gastrointestinal ROS: negative  Genito-Urinary ROS: negative  Hematological and Lymphatic ROS: negative  Endocrine ROS: negative  Musculoskeletal ROS: negative  Neurological ROS: negative     Physical Exam:     Vitals:    04/14/21 1620   BP: 130/74   Pulse: 75   Resp:    Temp: 97.9 F (36.6 C)   SpO2: 93%       Intake and Output Summary (Last 24 hours) at Date Time    Intake/Output Summary (Last 24 hours) at 04/14/2021 1704  Last data filed at 04/14/2021 1500  Gross per 24 hour   Intake 130 ml   Output 6650 ml   Net -6520 ml     Physical  Exam:  Gen: AAOX3, sitting comfortably in bed, in  NAD  HEENT: EOMI  CV: RRR  Pulm: CTAB  Abd: Soft, nondistended, nontender, no rebound or guarding, keloid scar with small draining sinus in center with purulent fluid  Ext: MAE spontaneously  Neuro: grossly normal, no focal deficits  Skin: warm and dry    Labs:     Results       Procedure Component Value Units Date/Time    Culture + Gram Stain,Aerobic, Wound [[id number]] Collected: 04/14/21 1201    Specimen: Wound Updated: 04/14/21 1655    Narrative:      Oren Binet Culturette  ORDER#: [id number]                                    ORDERED BY: Gustavus Messing, IMTI  SOURCE: Wound abdomen                                COLLECTED:  04/14/21 12:01  ANTIBIOTICS AT COLL.:                                RECEIVED :  04/14/21 15:22  ORDER ENTRY COMMENTS:  Blue Culturette  Stain, Gram                                FINAL       04/14/21 16:55  04/14/21   No Squamous epithelial cells seen             Many WBCs             Moderate Gram positive cocci  Culture and Gram Stain, Aerobic, Wound     PENDING      Glucose Whole Blood - POCT [[id number]]  (Abnormal) Collected: 04/14/21 1644     Updated: 04/14/21 1647     Whole Blood Glucose POCT 355 mg/dL     Culture + Gram Stain,Aerobic, Wound [[id number]] Collected: 04/14/21 1609    Specimen: Wound Updated: 04/14/21 1610    PTH, Intact [[id number]]  (Abnormal) Collected: 04/14/21 0341    Specimen: Blood Updated: 04/14/21 1033     PTH Intact 1,579.2 pg/mL     Urinalysis Reflex to Microscopic Exam- Reflex to Culture [[id number]]  (Abnormal) Collected: 04/14/21 0755     Updated: 04/14/21 0815     Urine Type Urine, Clean Ca     Color, UA Yellow     Clarity, UA Hazy     Specific Gravity UA 1.026     Urine pH 6.0     Leukocyte Esterase, UA Negative     Nitrite, UA Negative     Protein, UR >=500     Glucose, UA 500     Ketones UA Trace     Urobilinogen, UA Negative mg/dL      Bilirubin, UA Negative     Blood, UA Small     RBC, UA 3 - 5 /hpf      WBC, UA 0 - 5 /hpf      Squamous Epithelial Cells, Urine 0 - 5  /hpf     Narrative:      Rescheduled by [id number] at 04/13/2021 15:41 Reason: Patient unavailable    Glucose Whole Blood - POCT [[id number]]  (Abnormal) Collected: 04/14/21 0729  Updated: 04/14/21 0733     Whole Blood Glucose POCT 133 mg/dL     Vancomycin, random [[id number]] Collected: 04/14/21 0341    Specimen: Blood Updated: 04/14/21 0508     Vancomycin Random 22.9 ug/mL      Vancomycin Time of Last Dose unknown     Vancomycin Date of Last Dose 67/01/4101    Basic Metabolic Panel [[id number]]  (Abnormal) Collected: 04/14/21 0341    Specimen: Blood Updated: 04/14/21 0458     Glucose 178 mg/dL      BUN 42.0 mg/dL      Creatinine 7.3 mg/dL      Calcium 7.5 mg/dL      Sodium 139 mEq/L      Potassium 3.5 mEq/L      Chloride 104 mEq/L      CO2 25 mEq/L      Anion Gap 10.0    GFR [[id number]] Collected: 04/14/21 0341     Updated: 04/14/21 0458     EGFR 6.8    CBC without differential [[id number]]  (Abnormal) Collected: 04/14/21 0341    Specimen: Blood Updated: 04/14/21 0453     WBC 4.05 x10 3/uL      Hgb 11.3 g/dL      Hematocrit 35.1 %      Platelets 82 x10 3/uL      RBC 3.89 x10 6/uL      MCV 90.2 fL      MCH 29.0 pg      MCHC 32.2 g/dL      RDW 14 %      MPV 13.2 fL      Nucleated RBC 0.0 /100 WBC      Absolute NRBC 0.00 x10 3/uL     Glucose Whole Blood - POCT [[id number]]  (Abnormal) Collected: 04/13/21 2128     Updated: 04/13/21 2148     Whole Blood Glucose POCT 296 mg/dL     Hepatitis C (HCV) antibody, Total [[id number]] Collected: 04/13/21 1529    Specimen: Blood Updated: 04/13/21 2120     Hepatitis C, AB Non-Reactive    Hepatitis B (HBV) Surface Antigen w/ Reflex to Confirmation [[id number]] Collected: 04/13/21 1529    Specimen: Blood Updated: 04/13/21 2119     Hepatitis B Surface Antigen Non-Reactive    Hepatitis B (HBV) Surface Antibody Quant [[id number]] Collected: 04/13/21 1529    Specimen: Blood Updated: 04/13/21 2119     HEPATITIS B SURFACE ANTIBODY <3.31 mIU/mL     Glucose Whole Blood - POCT [[id number]]  (Abnormal)  Collected: 04/13/21 2009     Updated: 04/13/21 2011     Whole Blood Glucose POCT 295 mg/dL     Culture Blood Aerobic and Anaerobic [[id number]] Collected: 04/13/21 1529    Specimen: Arm from Blood, Venipuncture Updated: 04/13/21 2003    Narrative:      1 BLUE+1 PURPLE    Culture Blood Aerobic and Anaerobic [[id number]] Collected: 04/13/21 1529    Specimen: Arm from Blood, Venipuncture Updated: 04/13/21 2003    Narrative:      1 BLUE+1 PURPLE    Phosphorus [[id number]]  (Abnormal) Collected: 04/13/21 1526    Specimen: Blood Updated: 04/13/21 1750     Phosphorus 6.8 mg/dL             Rads:     Radiology Results (24 Hour)       ** No results found for the last 24 hours. **            Problem List:  Patient Active Problem List   Diagnosis    Iron deficiency anemia secondary to inadequate dietary iron intake    Sideropenic dysphagia    Peritoneal dialysis catheter dysfunction, initial encounter    Peritonitis    Upper GI bleed    ESRD (end stage renal disease) on dialysis    Generalized abdominal pain    Thrombocytopenia    h/o orthostatic hypotension    Type 2 diabetes mellitus, with long-term current use of insulin    GERD (gastroesophageal reflux disease)    Bowel incontinence    Right sided numbness    Uncontrolled type 2 diabetes mellitus with hyperglycemia    Hypertension    Hyperlipidemia    History of COPD    Gastritis with hemorrhage    History of anemia due to chronic kidney disease    Open wound, abdominal wall, lateral, subsequent encounter    Gout    Dizziness    Fall    History of CVA (cerebrovascular accident)    Pneumonia due to COVID-19 virus    Diarrhea    Hypertensive urgency    Shortness of breath    ESRD (end stage renal disease)    Hyperkalemia    Pulmonary edema    Hypervolemia    Elevated troponin    Chest pain    Nausea vomiting and diarrhea    Breast pain, left    Syncope    Hypertensive emergency    Volume overload       Assessment:   67 y.o. female with a subcutaneous abscess along prior PD  catheter site.    Plan:   -- Plan for bedside I&D of abscess.   -- If large, may benefit from wound vac placement.     Seen and discussed with Dr. Ailene Ards.    Signed by: Ginny Forth, MD      This patient was personally seen and examined by me and I agree with the assessment and plan above.  All notes, labs, and xrays were reviewed.  The patient was seen on the date the note was written.  Alert in no distress  Tender along site with pus draining from old scarred area from pd cath  I and d today. Dialysis today  Cultures pending        Iran Sizer, MD , MD [facility]  [facility]  [address] [place], [place] [address]  [phone]

## 2021-04-15 DIAGNOSIS — L02211 Cutaneous abscess of abdominal wall: Secondary | ICD-10-CM

## 2021-04-15 LAB — BASIC METABOLIC PANEL
Anion Gap: 9 (ref 5.0–15.0)
BUN: 29 mg/dL — ABNORMAL HIGH (ref 7.0–21.0)
CO2: 27 mEq/L (ref 17–29)
Calcium: 7.3 mg/dL — ABNORMAL LOW (ref 8.5–10.5)
Chloride: 104 mEq/L (ref 99–111)
Creatinine: 4.6 mg/dL — ABNORMAL HIGH (ref 0.4–1.0)
Glucose: 240 mg/dL — ABNORMAL HIGH (ref 70–100)
Potassium: 3.7 mEq/L (ref 3.5–5.3)
Sodium: 140 mEq/L (ref 135–145)

## 2021-04-15 LAB — CBC
Absolute NRBC: 0 10*3/uL (ref 0.00–0.00)
Hematocrit: 38.3 % (ref 34.7–43.7)
Hgb: 11.8 g/dL (ref 11.4–14.8)
MCH: 29 pg (ref 25.1–33.5)
MCHC: 30.8 g/dL — ABNORMAL LOW (ref 31.5–35.8)
MCV: 94.1 fL (ref 78.0–96.0)
MPV: 13.3 fL — ABNORMAL HIGH (ref 8.9–12.5)
Nucleated RBC: 0 /100 WBC (ref 0.0–0.0)
Platelets: 88 10*3/uL — ABNORMAL LOW (ref 142–346)
RBC: 4.07 10*6/uL (ref 3.90–5.10)
RDW: 14 % (ref 11–15)
WBC: 5.1 10*3/uL (ref 3.10–9.50)

## 2021-04-15 LAB — VANCOMYCIN, RANDOM: Vancomycin Random: 25.1 ug/mL

## 2021-04-15 LAB — GLUCOSE WHOLE BLOOD - POCT
Whole Blood Glucose POCT: 189 mg/dL — ABNORMAL HIGH (ref 70–100)
Whole Blood Glucose POCT: 180 mg/dL — ABNORMAL HIGH (ref 70–100)
Whole Blood Glucose POCT: 319 mg/dL — ABNORMAL HIGH (ref 70–100)

## 2021-04-15 LAB — GFR: EGFR: 11.5

## 2021-04-15 MED ORDER — LIDOCAINE-EPINEPHRINE 2 %-1:100000 IJ SOLN
20.0000 mL | Freq: Once | INTRAMUSCULAR | Status: DC
Start: 2021-04-15 — End: 2021-04-19
  Filled 2021-04-15: qty 20

## 2021-04-15 MED ORDER — PIPERACILLIN-TAZOBACTAM 2.25 GM MBP (CNR)
2.2500 g | Freq: Two times a day (BID) | INTRAVENOUS | Status: DC
Start: 2021-04-15 — End: 2021-04-19
  Administered 2021-04-15 – 2021-04-18 (×7): 2250 mg via INTRAVENOUS
  Filled 2021-04-15 (×5): qty 1
  Filled 2021-04-15: qty 2250
  Filled 2021-04-15: qty 1
  Filled 2021-04-15: qty 2250

## 2021-04-15 MED ORDER — SEVELAMER CARBONATE 800 MG PO TABS
800.0000 mg | ORAL_TABLET | Freq: Three times a day (TID) | ORAL | Status: DC
Start: 2021-04-15 — End: 2021-04-19
  Administered 2021-04-15 – 2021-04-19 (×13): 800 mg via ORAL
  Filled 2021-04-15 (×13): qty 1

## 2021-04-15 MED ORDER — SODIUM CHLORIDE 0.9 % IV BOLUS
250.0000 mL | INTRAVENOUS | Status: AC | PRN
Start: 2021-04-15 — End: 2021-04-15

## 2021-04-15 MED ORDER — VANCOMYCIN HCL IN NACL 750-0.9 MG/250ML-% IV SOLN
750.0000 mg | Freq: Once | INTRAVENOUS | Status: AC
Start: 2021-04-15 — End: 2021-04-15
  Administered 2021-04-15: 18:00:00 750 mg via INTRAVENOUS
  Filled 2021-04-15: qty 250
  Filled 2021-04-15: qty 750

## 2021-04-15 MED ORDER — HYDROMORPHONE HCL 1 MG/ML IJ SOLN
0.4000 mg | Freq: Once | INTRAMUSCULAR | Status: DC | PRN
Start: 2021-04-15 — End: 2021-04-19

## 2021-04-15 MED ORDER — SODIUM CHLORIDE 0.9 % IV BOLUS
100.0000 mL | INTRAVENOUS | Status: AC | PRN
Start: 2021-04-15 — End: 2021-04-15

## 2021-04-15 MED ORDER — LIDOCAINE-EPINEPHRINE 1 %-1:100000 IJ SOLN
20.0000 mL | Freq: Once | INTRAMUSCULAR | Status: DC | PRN
Start: 2021-04-15 — End: 2021-04-15
  Filled 2021-04-15: qty 20

## 2021-04-15 NOTE — Procedures (Signed)
SURGEON: Loran Senters, MD  ASSISTANT: Ginny Forth, MD     POSTOPERATIVE DIAGNOSIS:   Abdominal wall abscess.     POSTOPERATIVE DIAGNOSIS:   Same.     TITLE OF PROCEDURE:   Incision and drainage of abdominal wall abscess.       INDICATIONS:   66 y.o. female with a subcutaneous abscess along prior PD catheter site.    DESCRIPTION OF PROCEDURE:   The patient was placed in supine position and 10 mL of   1% lidocaine with epinephrine were used as a local anesthetic to anesthetize the area. After sterilization, an approximately 2cm oval incision was made in the prior keloid scar over the draining sinus. Purulence was drained and the cavity probed to further assess the fluid collection cavity. There were no further collections found. The area was inspected and packed and a dressing was left. The patient tolerated the procedure well. All sponge and needle counts were correct. Dr. Ailene Ards was present for and participated in the entire procedure.    Ginny Forth, MD  General Surgery Resident    This patient was personally seen and examined by me and I agree with the assessment and plan above.  All notes, labs, and xrays were reviewed.  The patient was seen on the date the note was written.        Iran Sizer, MD , MD [facility]  [facility]  [address] [place], [place] [address]  [phone]

## 2021-04-15 NOTE — UM Notes (Signed)
[name]  Female, 66 y.o., 1955/03/15  MRN: [id number]      3/24 surgery consult    Assessment:   66 y.o. female with a subcutaneous abscess along prior PD catheter site.     Plan:   -- Plan for bedside I&D of abscess.   -- If large, may benefit from wound vac placement.       3/24 neph prog note    CC: follow-up ESRD     Assessment:      ESRD on HD MWF at [facility] - followed by Dr. 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  Metabolic acidosis   Diabetes mellitus  Hypertension  Hyperlipidemia  Diabetic neuropathy  COPD  Obstructive sleep apnea  Severe obesity BMI 44.7  Full code   P  an:      Afebrile, nontoxic on IV Zosyn, vancomycin, ID following.  Appreciate surgery consultation I&D at bedside.  Dialysis per schedule.  Oxygenating normal 93% on room air.  Blood sugar improving, continue current dose of Lantus sliding scale coverage.  Blood pressure controlled on amlodipine  On aspirin, statin.  On gabapentin  Respiratory status stable, symptomatic treatment  Supportive care  DVT  prophylaxis  Discharge planning when cleared by others      Current Facility-Administered Medications   Medication Dose Route Frequency    amLODIPine  10 mg Oral Daily    aspirin  81 mg Oral Daily    atorvastatin  40 mg Oral Daily    carvedilol  25 mg Oral Q12H [facility]    dextromethorphan-guaiFENesin  1 tablet Oral Q12H    gabapentin  100 mg Oral Q8H [place]    insulin glargine  25 Units Subcutaneous QHS    insulin lispro  1-4 Units Subcutaneous QHS    insulin lispro  1-8 Units Subcutaneous TID AC    insulin lispro  10 Units Subcutaneous Once    lactobacillus/streptococcus  1 capsule Oral Daily    pantoprazole  40 mg Oral QAM AC    piperacillin-tazobactam  2.25 g Intravenous Q8H    sevelamer  800 mg Oral BID Meals    vancomycin   Intravenous See Admin Instructions         04/15/2021 0600      Gross per 24 hour   Intake 360 ml   Output 3300 ml   Net -2940 ml  04/15/21  0310 04/14/21  0341 04/13/21  1526   Hgb 11.8 11.3* 12.2   Hematocrit 38.3 35.1 38.7   Platelets 88* 82* 90*      04/15/21  0310 04/14/21  0341 04/13/21  1526   Sodium 140 139 134*   BUN 29.0* 42.0* 69.0*   Creatinine 4.6* 7.3* 10.2*   Glucose 240* 178* 523*   Calcium 7.3* 7.5* 8.0*   Phosphorus  --   --  6.8*   Protein, Total  --   --  5.9*   Albumin  --   --  2.5*   Alkaline Phosphatase  --   --  229*         Ayomide Purdy [place] RN, BSN, ACM-RN, CCM  Utilization Review RN Case Manager [place]  Utilization Review Department  [address]  [place], [place] [address]  T [phone] (Confidential voice mail) C [phone] (Confidential voice mail)   Jerilynn Mages [phone] F [phone]  [email or web address]

## 2021-04-15 NOTE — Respiratory Progress Note (Signed)
Respiratory Therapy Patient Assessment    A2315/A2315.01  04/15/21 2:53 AM  RT: Donivan Scull, RT      Admitting DX: Volume overload [E87.70]    Pulmonary History: COPD, Sleep Apnea    Other Pulm Hx:      Therapy ordered:     IP Meds - Nasal and Inhaled (From admission, onward)      Start     Stop Status Route Frequency Ordered    04/13/21 2053  albuterol sulfate HFA (PROVENTIL) inhaler 2 puff         -- Dispensed IN RT - Every 6 hours as needed 04/13/21 2053               PT able to take deep breath? Yes            Surgical Status: None  Airway: Natural   Mobility: Ambulatory with or without assistance  CXR: cardiomegaly, no active disease    Cough Effort: None            Can clear secretions with cough? N/A  Can clear secretions with suctioning? N/A     Social History     Tobacco Use   Smoking Status Never   Smokeless Tobacco Never   Vaping Use   Vaping Status Never Used        Breath Sounds:  Bilateral Breath Sounds: Clear  R Breath Sounds: Clear  L Breath Sounds: Clear    Heart Rate: 75 Resp Rate: 18  SpO2: 96 % O2 Device: None (Room air)     O2 Flow Rate (L/min): 2 L/min    Home regimen:  Home Treatments: albuterolo mdi Q6prn  Home Oxygen: n   Home CPAP/BiLevel: n    Criteria for therapy:  Secretion Clearance: None indicated  Lung Expansion: None indicated  Medications: None indicated    Recommendations/Interventions:  Recommendations/Interventions: Continue home regimen     Expected Outcomes:  Secretion: Other (Comment) (na)  Lung Expansion: Other (Comment) (na)  Meds: Return to baseline home regimen      Re-Evaluation:  Follow-up Date:   Improving with Therapy: N/A    Plan of Care Recommendations:  Plan of Care: continue home regimen, mdi Q6 as needed

## 2021-04-15 NOTE — Progress Notes (Signed)
Infectious Diseases & Tropical Medicine  Progress Note    04/15/2021   [name] NHA:[id number],ANV:[id number] is a 66 y.o. female,       Assessment:     Abdominal wall abscess/wound at the site of previous peritoneal dialysis catheter (removed 2 years ago)  Wound culture pending  End-stage renal disease on hemodialysis  Diabetes mellitus  COPD  Sleep apnea  No fever or leukocytosis  Clinically stable    Plan:     Continue vancomycin and Zosyn  Pharmacy monitoring vancomycin levels  Awaiting wound culture  General surgery following-awaiting incision and drainage  Continue probiotics  Monitor clinically      Discussed with nursing staff  Discussed with patient in detail      ROS:     General:  no fever, no chills, no rigor, sleepy but arousable, receiving hemodialysis  HEENT: no neck pain, no throat pain  Endocrine:  no fatigue, no night sweats  Respiratory: no cough, shortness of breath, or wheezing   Cardiovascular: no chest pain   Gastrointestinal: Abdominal wall draining wound  Genito-Urinary: no hematuria  Musculoskeletal: no edema  Neurological: c/o generalized weakness   Dermatological: no rash, no ulcer    Physical Examination:     Blood pressure 111/61, pulse 63, temperature 97.9 F (36.6 C), temperature source Oral, resp. rate 17, height 1.575 m (5\' 2" ), weight 110.9 kg (244 lb 7.8 oz), SpO2 93 %.    General Appearance: Comfortable  HEENT: Pupils are equal, round, and reactive to light.   Lungs:  Clear to auscultation  Heart:  Regular rate and rhythm  Chest: Symmetric chest wall expansion.   Abdomen: soft ,non tender,no hepatosplenomegaly,good bowel sounds, abdominal wall wound draining pus on squeeze  Neurological: No focal deficit  Extremities: No edema    Laboratory And Diagnostic Studies:     Recent Labs     04/15/21  0310 04/14/21  0341   WBC 5.10 4.05   Hgb 11.8 11.3*   Hematocrit 38.3 35.1   Platelets 88* 82*     Recent Labs     04/15/21  0310 04/14/21  0341   Sodium 140 139   Potassium 3.7 3.5    Chloride 104 104   CO2 27 25   BUN 29.0* 42.0*   Creatinine 4.6* 7.3*   Glucose 240* 178*   Calcium 7.3* 7.5*     Recent Labs     04/13/21  1526   AST (SGOT) 18   ALT 23   Alkaline Phosphatase 229*   Protein, Total 5.9*   Albumin 2.5*       Current Meds:      Scheduled Meds: PRN Meds:    amLODIPine, 10 mg, Oral, Daily  aspirin, 81 mg, Oral, Daily  atorvastatin, 40 mg, Oral, Daily  carvedilol, 25 mg, Oral, Q12H [facility]  dextromethorphan-guaiFENesin, 1 tablet, Oral, Q12H  gabapentin, 100 mg, Oral, Q8H [facility]  insulin glargine, 25 Units, Subcutaneous, QHS  insulin lispro, 1-4 Units, Subcutaneous, QHS  insulin lispro, 1-8 Units, Subcutaneous, TID AC  insulin lispro, 10 Units, Subcutaneous, Once  lactobacillus/streptococcus, 1 capsule, Oral, Daily  pantoprazole, 40 mg, Oral, QAM AC  piperacillin-tazobactam, 2.25 g, Intravenous, Q8H  sevelamer, 800 mg, Oral, BID Meals  vancomycin, , Intravenous, See Admin Instructions        Continuous Infusions:   acetaminophen, 500 mg, Q6H PRN  albuterol sulfate HFA, 2 puff, Q6H PRN  dextrose, 15 g of glucose, PRN   Or  dextrose, 12.5 g, PRN  Or  dextrose, 12.5 g, PRN   Or  glucagon (rDNA), 1 mg, PRN  diphenhydrAMINE, 6.25 mg, Q6H PRN  melatonin, 3 mg, QHS PRN  morphine, 2 mg, Q4H PRN  naloxone, 0.2 mg, PRN  ondansetron, 4 mg, Q8H PRN   Or  ondansetron, 4 mg, Q8H PRN  oxyCODONE-acetaminophen, 1 tablet, Q4H PRN  petrolatum, , PRN          Nikolis Berent A. Gustavus Messing, M.D.  04/15/2021  8:48 AM

## 2021-04-15 NOTE — Progress Notes (Signed)
[facility] PROGRESS NOTE  [id number]      Date Time: 04/15/21 10:43 AM  Patient Name: [name]  Attending Physician: Barbaraann Faster, MD    CC: follow-up ESRD    Assessment:     ESRD on HD MWF at [facility] - followed by Dr. 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  Metabolic acidosis 2/2 missed HD  Abdominal cellulitis vs abscess - on vanco and zosyn  Uncontrolled DM-Type 2 with hyperglycemia - BG~ 500  Pseudohyponatremia 2/2 hyperglycemia - resolved    Recommendations:     Presently being dialyzed, aiming for 3.5-4L net UF  We will assess need for additional UF session tomorrow     Dialysis Treatment Data:  Blood flow rate: 400 mL/min  Dialysate flow rate: 700 mL/min  Ultrafiltration rate: 1000 mL/hr    Dialysate potassium: 3 mEq/L  Dialysate calcium: 2.5 mEq/L    Arterial pressure: -147 mmHg  Venous pressure: 133mmHg     Case discussed with: HD RN      Verl Blalock, MD  [facility]  [id number] (office)      Subjective:   Seen on hemodialysis, tolerating treatment well, hemodynamically stable   Review of Systems:   Deferred as she is resting at this time    Physical Exam:     Vitals:    04/15/21 0643 04/15/21 1015 04/15/21 1025 04/15/21 1030   BP: 111/61 121/58 129/61 147/68   Pulse: 63 62 63 60   Resp: 17 20 18 18    Temp: 97.9 F (36.6 C) 98 F (36.7 C) 98 F (36.7 C)    TempSrc: Oral      SpO2: 93% 97% 97% 96%   Weight:       Height:           Intake and Output Summary (Last 24 hours) at Date Time    Intake/Output Summary (Last 24 hours) at 04/15/2021 1043  Last data filed at 04/15/2021 0750  Gross per 24 hour   Intake 360 ml   Output 3200 ml   Net -2840 ml          General: asleep  Cardiovascular: regular rate and rhythm  Lungs: clear to auscultation bilaterally, bilateral air entry, normal work of breathing  Abdomen: soft  Extremities: edema in her feet       Meds:      Scheduled Meds: PRN Meds:    amLODIPine, 10 mg, Oral, Daily  aspirin, 81 mg, Oral, Daily  atorvastatin, 40 mg, Oral, Daily  carvedilol, 25 mg, Oral, Q12H [facility]  dextromethorphan-guaiFENesin, 1 tablet, Oral, Q12H  gabapentin, 100 mg, Oral, Q8H [facility]  insulin glargine, 25 Units, Subcutaneous, QHS  insulin lispro, 1-4 Units, Subcutaneous, QHS  insulin lispro, 1-8 Units, Subcutaneous, TID AC  insulin lispro, 10 Units, Subcutaneous, Once  lactobacillus/streptococcus, 1 capsule, Oral, Daily  lidocaine-EPINEPHrine, 20 mL, Other, Once  pantoprazole, 40 mg, Oral, QAM AC  piperacillin-tazobactam, 2.25 g, Intravenous, Q8H  sevelamer, 800 mg, Oral, BID Meals  vancomycin, 750 mg, Intravenous, Once  vancomycin, , Intravenous, See Admin Instructions          Continuous Infusions:  acetaminophen, 500 mg, Q6H PRN  albuterol sulfate HFA, 2 puff, Q6H PRN  dextrose, 15 g of glucose, PRN   Or  dextrose, 12.5 g, PRN   Or  dextrose, 12.5 g, PRN   Or  glucagon (rDNA), 1 mg, PRN  diphenhydrAMINE, 6.25 mg, Q6H PRN  HYDROmorphone, 0.4 mg, Once PRN  melatonin, 3 mg, QHS PRN  morphine, 2 mg, Q4H PRN  naloxone, 0.2 mg, PRN  ondansetron, 4 mg, Q8H PRN   Or  ondansetron, 4 mg, Q8H PRN  oxyCODONE-acetaminophen, 1 tablet, Q4H PRN  petrolatum, , PRN  sodium chloride, 100 mL, Q1H PRN  sodium chloride, 250 mL, PRN              Labs:     Recent Labs   Lab 04/15/21  0310 04/14/21  0341 04/13/21  1526   WBC 5.10 4.05 5.06   Hgb 11.8 11.3* 12.2   Hematocrit 38.3 35.1 38.7   Platelets 88* 82* 90*       Recent Labs   Lab 04/15/21  0310 04/14/21  0341 04/13/21  1526   Sodium 140 139 134*   Potassium 3.7 3.5 4.2   Chloride 104 104 99   CO2 27 25 19    BUN 29.0* 42.0* 69.0*   Creatinine 4.6* 7.3* 10.2*   Calcium 7.3* 7.5* 8.0*   Albumin  --   --  2.5*    Phosphorus  --   --  6.8*   Glucose 240* 178* 523*   EGFR 11.5 6.8 4.6         Recent Labs   Lab 04/14/21  0755   Urine Type Urine, Clean Ca   Color, UA Yellow   Clarity, UA Hazy   Specific Gravity UA 1.026   Urine pH 6.0   Nitrite, UA Negative   Ketones UA Trace*   Urobilinogen, UA Negative   Bilirubin, UA Negative   Blood, UA Small*   RBC, UA 3 - 5   WBC, UA 0 - 5           Signed by: Verl Blalock, MD

## 2021-04-15 NOTE — Progress Notes (Signed)
Completed 3.5 hours of HD. 4.1 L UF tolerated well. Report given to to [facility] RN.     04/15/21 1400   Treatment Summary   Time Off Machine 1355   Duration of Treatment (Hours) 3.5   Treatment Type 2:1   Dialyzer Clearance Moderately streaked   Fluid Volume Off (mL) 4500   Prime Volume (mL) 200   Rinseback Volume (mL) 200   Fluid Given: Normal Saline (mL) 0   Fluid Given: PRBC  0 mL   Fluid Given: Albumin (mL) 0   Fluid Given: Other (mL) 0   Total Fluid Given 400   Hemodialysis Net Fluid Removed 4100   Post Treatment Assessment   Post-Treatment Weight (Kg) 106.8   Patient Response to Treatment tolerated well   Vitals   Temp 98.1 F (36.7 C)   Heart Rate 60   Resp Rate 20   BP 170/84   SpO2 98 %   O2 Device None (Room air)   Assessment   Mental Status Alert;Cooperative;Oriented   Cardiac (WDL) WDL   Cardiac Regularity Regular   Cardiac Symptoms None   Cardiac Rhythm Normal Sinus Rhythm   Respiratory  WDL   Respiratory Pattern Regular   Bilateral Breath Sounds Clear   Cough Spontaneous   Edema  WDL   Generalized Edema Non Pitting Edema   Facial Non Pitting Edema   Sacral UTA   RLE Edema +1   LLE Edema +1   General Skin Color Appropriate for ethnicity   Skin Condition/Temp Warm;Dry   Gastrointestinal (WDL) WDL   Abdomen Inspection Soft;Nondistended   GI Symptoms None   Mobility Bed   Pain Assessment   Charting Type Assessment   Pain Scale Used Numeric Scale (0-10)   Numeric Pain Scale   Pain Score 0   POSS Score 1   Education   Person taught Patient   Knowledge basis Substantial   Topics taught Fluid Management   Teaching Tools Explain   Reponse Verbalizes Understanding   Bedside Nurse Communication   Name of bedside RN - post dialysis Candice Camp

## 2021-04-15 NOTE — Progress Notes (Signed)
Patient alert, oriented x 3. Denies any pain. Vital signs within normal range. Daily HD due to fluid overload and compliance issues.UF as tolerated. Will monitor closely on dialysis.     04/15/21 1015   Bedside Nurse Communication   Name of bedside RN - pre dialysis Kemisola Adeyemo   Treatment Initiation- With Dialysis Precautions   Time Out/Safety Check Completed Yes   Consent for HD signed for this hospitalization (Date) 02/04/21   Consent for HD signed for this hospitalization (Time) 1450   Preferred language Yes   Complication waiver N   Blood Consent Verified N/A   Dialysis Precautions All Connections Secured;Saline Line Double Clamped;Venous Parameters Set;Arterial Parameters Set;Air Foam Detecctor Engaged   Dialysis Treatment Type Routine   Special Considerations daily   Is patient diabetic? Yes   RO/Hemodialysis Architectural technologist   Is Total Chlorine less than 0.1 ppm? Yes   Orignial Total Chlorine Testing Time 1011   RO/Hemodialysis Facilities manager Number 1    Machine Serial Number [id number]   RO # 27   RO Serial # [id number]   Water Hardness NA   pH 7.2   Pressure Test Verified Yes   Alarms Verified Passed   Machine Temperature 97.7 F (36.5 C)   Alarms Verified Yes   Na+ mEq (Machine) 138 mEq   Bicarb mEq (Machine) 35 mEq   Hemodialysis Conductivity (Machine) 13.8   Hemodialysis Conductivity (Meter) 13.8   Dialyzer Lot Number [id number]   Tubing Lot Number [id number]   RO Machine Log Completed Yes   Hepatitis Status   HBsAg (Antigen) Result Negative   HBsAg Date Drawn 04/13/21   HBsAg Repeat Draw Due Date 05/07/21   Dialysis Weight   Pre-Treatment Weight (Kg) 110.9   Scale Type ICU Bed Scale   Vitals   Temp 98 F (36.7 C)   Heart Rate 62   Resp Rate 20   BP 121/58   SpO2 97 %   O2 Device None (Room air)   Assessment   Mental Status Alert;Oriented;Cooperative   Cardiac (WDL) WDL   Cardiac Regularity Regular   Cardiac Symptoms None   Cardiac Rhythm Normal Sinus Rhythm   Respiratory  WDL    Respiratory Pattern Regular   Bilateral Breath Sounds Clear   Cough Spontaneous   Edema  WDL   Generalized Edema Non Pitting Edema   Facial Non Pitting Edema   Sacral UTA   RLE Edema +1   LLE Edema +1   General Skin Color Appropriate for ethnicity   Skin Condition/Temp Warm;Dry   Gastrointestinal (WDL) WDL   Abdomen Inspection Soft;Nondistended   GI Symptoms None   Mobility Bed   Pain Assessment   Charting Type Assessment   Pain Scale Used Numeric Scale (0-10)   Numeric Pain Scale   Pain Score 0   POSS Score 1   Hemodialysis Comments   Pre-Hemodialysis Comments time out done   Hemodialysis History Information   Outpatient Dialysis Unit [facility]   Chronic Dialysis Patient Yes   Outpatient Nephrologist Dr Conley Canal

## 2021-04-15 NOTE — Progress Notes (Signed)
PROGRESS NOTE     Date Time: 04/15/21 8:10 AM  Patient Name: [name],[name]      ASSESSMENT:   Acute on Chronic Thrombocytopenia, likely ITP- post infectious exacerbation- stable  Chronic Normocytic Anemia, due to CKD - improving  ESRD on HD MWF, Missed HD for >1 week.- Cr improving  Abdominal wall abscess at PD catheter site. 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  Chronic respiratory failure on home oxygen  Metabolic acidosis 2/2 missed HD  Uncontrolled DM-Type 2   Pseudohyponatremia 2/2 hyperglycemia- resolved  Hyperlipidemia   Diabetic neuropathy  COPD  Obstructive sleep apnea  Severe obesity  Protein energy malnutrition  Elevated ALK phos    Cindi Carbon, MD    04/15/2021  8:10 AM    Plan:   Clinically stable  Platelets are above threshold of intervention  Monitor H/H and Platelets daily  Continue iron repletion with dialysis and ESA therapy  Continue antibiotics   Surgery consult appreciated - Patient underwent I&D of abdominal wall abscess today  Continue home meds for comorbid conditions    Subjective:   No new complaints  Looks comfortable  Denies sob or cp   No fevers or chills      S/p I&D of abdominal wall abscess on 3/24    Medications:      Scheduled Meds: PRN Meds:    amLODIPine, 10 mg, Oral, Daily  aspirin, 81 mg, Oral, Daily  atorvastatin, 40 mg, Oral, Daily  carvedilol, 25 mg, Oral, Q12H [facility]  dextromethorphan-guaiFENesin, 1 tablet, Oral, Q12H  gabapentin, 100 mg, Oral, Q8H [facility]  insulin glargine, 25 Units, Subcutaneous, QHS  insulin lispro, 1-4 Units, Subcutaneous, QHS  insulin lispro, 1-8 Units, Subcutaneous, TID AC  insulin lispro, 10 Units, Subcutaneous, Once  lactobacillus/streptococcus, 1 capsule, Oral, Daily  pantoprazole, 40 mg, Oral, QAM AC  piperacillin-tazobactam,  2.25 g, Intravenous, Q8H  sevelamer, 800 mg, Oral, BID Meals  vancomycin, , Intravenous, See Admin Instructions         acetaminophen, 500 mg, Q6H PRN  albuterol sulfate HFA, 2 puff, Q6H PRN  dextrose, 15 g of glucose, PRN   Or  dextrose, 12.5 g, PRN   Or  dextrose, 12.5 g, PRN   Or  glucagon (rDNA), 1 mg, PRN  diphenhydrAMINE, 6.25 mg, Q6H PRN  melatonin, 3 mg, QHS PRN  morphine, 2 mg, Q4H PRN  naloxone, 0.2 mg, PRN  ondansetron, 4 mg, Q8H PRN   Or  ondansetron, 4 mg, Q8H PRN  oxyCODONE-acetaminophen, 1 tablet, Q4H PRN  petrolatum, , PRN          I personally reviewed all of the medications    Review of Systems:   Per narrative, 2 system review was done.    Physical Exam:   Visit Vitals  BP 111/61   Pulse 63   Temp 97.9 F (36.6 C) (Oral)   Resp 17   Ht 1.575 m (5\' 2" )   Wt 110.9 kg (244 lb 7.8 oz)   LMP  (LMP Unknown)   SpO2 93%   BMI 44.72 kg/m         Intake/Output Summary (Last 24 hours) at 04/15/2021 0810  Last data filed at 04/15/2021 0600  Gross per 24 hour   Intake 360 ml   Output 3200 ml  Net -2840 ml          Physical Exam:      General: Obesity  Skin: no rashes or lesions.  Eye: PERRL  HENT: no scleral icterus, no lymphadenopathy.  Lungs: non-labored respiration.  Heart: normal rate, regular rhythm, no murmur.  Abdomen: soft, non-tender. PD site noted with erythema.  Musculoskeletal: no tenderness and swelling  Neurologic: awake, alert, and oriented X3, non-focal exam.          Labs:     Recent Labs   Lab 04/15/21  0310 04/14/21  0341 04/13/21  1526   Glucose 240* 178* 523*   BUN 29.0* 42.0* 69.0*   Creatinine 4.6* 7.3* 10.2*   Calcium 7.3* 7.5* 8.0*   Sodium 140 139 134*   Potassium 3.7 3.5 4.2   Chloride 104 104 99   CO2 27 25 19    Albumin  --   --  2.5*   Phosphorus  --   --  6.8*   AST (SGOT)  --   --  18   ALT  --   --  23   Bilirubin, Total  --   --  0.6   Alkaline Phosphatase  --   --  229*     Recent Labs   Lab 04/15/21  0310 04/14/21  0341 04/13/21  1526   WBC 5.10 4.05 5.06   Hgb 11.8 11.3*  12.2   Hematocrit 38.3 35.1 38.7   MCV 94.1 90.2 90.0   MCH 29.0 29.0 28.4   MCHC 30.8* 32.2 31.5   Platelets 88* 82* 90*       No results for input(s): PTT, PT, INR in the last 72 hours.      Microbiology:  Microbiology Results (last 15 days)       Procedure Component Value Units Date/Time    Culture + Gram Stain,Aerobic, Wound [[id number]] Collected: 04/14/21 1609    Order Status: Completed Specimen: Wound Updated: 04/15/21 0003    Narrative:      Neta Mends  ORDER#: [id number]                                    ORDERED BY: Gustavus Messing, IMTI  SOURCE: Wound Abdominal wall wound                   COLLECTED:  04/14/21 16:09  ANTIBIOTICS AT COLL.:                                RECEIVED :  04/14/21 22:37  ORDER ENTRY COMMENTS:  ESwab  Stain, Gram                                FINAL       04/15/21 00:03  04/15/21   No Squamous epithelial cells seen             No WBCs or organisms seen  Culture and Gram Stain, Aerobic, Wound     PENDING      Culture + Gram Stain,Aerobic, Wound [[id number]] Collected: 04/14/21 1201    Order Status: Completed Specimen: Wound Updated: 04/14/21 1655    Narrative:      Sissy Hoff  ORDER#: [id number]  ORDERED BY: CHOUDHARY, IMTI  SOURCE: Wound abdomen                                COLLECTED:  04/14/21 12:01  ANTIBIOTICS AT COLL.:                                RECEIVED :  04/14/21 15:22  ORDER ENTRY COMMENTS:  Blue Culturette  Stain, Gram                                FINAL       04/14/21 16:55  04/14/21   No Squamous epithelial cells seen             Many WBCs             Moderate Gram positive cocci  Culture and Gram Stain, Aerobic, Wound     PENDING      COVID-19 (SARS-CoV-2) and Influenza A/B, NAA (Liat Rapid)- Admission [[id number]] Collected: 04/13/21 1529    Order Status: Completed Specimen: Culturette from Nasopharyngeal Updated: 04/13/21 1615     Purpose of COVID testing Diagnostic -PUI     SARS-CoV-2 Specimen Source Nasal Swab     SARS CoV 2 Overall  Result Not Detected     Comment: __________________________________________________  -A result of "Detected" indicates POSITIVE for the    presence of SARS CoV-2 RNA  -A result of "Not Detected" indicates NEGATIVE for the    presence of SARS CoV-2 RNA  __________________________________________________________  Test performed using the Roche cobas Liat SARS-CoV-2 assay. This assay is  only for use under the Food and Drug Administration's Emergency Use  Authorization. This is a real-time RT-PCR assay for the qualitative  detection of SARS-CoV-2 RNA. Viral nucleic acids may persist in vivo,  independent of viability. Detection of viral nucleic acid does not imply the  presence of infectious virus, or that virus nucleic acid is the cause of  clinical symptoms. Negative results do not preclude SARS-CoV-2 infection and  should not be used as the sole basis for diagnosis, treatment or other  patient management decisions. Negative results must be combined with  clinical observations, patient history, and/or epidemiological information.  Invalid results may be due to inhibiting substances in the specimen and  recollection should occur. Please see Fact Sheets for patients and providers  located:  https://www.benson-chung.com/          Influenza A Not Detected     Influenza B Not Detected     Comment: Test performed using the Roche cobas Liat SARS-CoV-2 & Influenza A/B assay.  This assay is only for use under the Food and Drug Administration's  Emergency Use Authorization. This is a multiplex real-time RT-PCR assay  intended for the simultaneous in vitro qualitative detection and  differentiation of SARS-CoV-2, influenza A, and influenza B virus RNA. Viral  nucleic acids may persist in vivo, independent of viability. Detection of  viral nucleic acid does not imply the presence of infectious virus, or that  virus nucleic acid is the cause of clinical symptoms. Negative results do  not preclude SARS-CoV-2,  influenza A, and/or influenza B infection and  should not be used as the sole basis for diagnosis, treatment or other  patient management decisions. Negative results must be combined with  clinical observations, patient history,  and/or epidemiological information.  Invalid results may be due to inhibiting substances in the specimen and  recollection should occur. Please see Fact Sheets for patients and providers  located: http://olson-hall.info/.         Narrative:      o Collect and clearly label specimen type:  o PREFERRED-Upper respiratory specimen: One Nasal Swab in  Transport Media.  o Hand deliver to laboratory ASAP  Diagnostic -PUI    Culture Blood Aerobic and Anaerobic [[id number]] Collected: 04/13/21 1529    Order Status: Completed Specimen: Arm from Blood, Venipuncture Updated: 04/14/21 2021    Narrative:      ORDER#: [id number]                                    ORDERED BY: MANOOCHEHRI, OM  SOURCE: Blood, Venipuncture arm                      COLLECTED:  04/13/21 15:29  ANTIBIOTICS AT COLL.:                                RECEIVED :  04/13/21 20:03  Culture Blood Aerobic and Anaerobic        PRELIM      04/14/21 20:21  04/14/21   No Growth after 1 day/s of incubation.      Culture Blood Aerobic and Anaerobic [[id number]] Collected: 04/13/21 1529    Order Status: Completed Specimen: Arm from Blood, Venipuncture Updated: 04/14/21 2021    Narrative:      ORDER#: [id number]                                    ORDERED BY: MANOOCHEHRI, OM  SOURCE: Blood, Venipuncture arm                      COLLECTED:  04/13/21 15:29  ANTIBIOTICS AT COLL.:                                RECEIVED :  04/13/21 20:03  Culture Blood Aerobic and Anaerobic        PRELIM      04/14/21 20:21  04/14/21   No Growth after 1 day/s of incubation.                Signed by: Cindi Carbon, MD

## 2021-04-15 NOTE — Progress Notes (Signed)
Pharmacy Vancomycin Dosing Consult:  Day 3  Vancomycin Indication: Abdominal wall abscess at PD catheter site  Vancomycin Monitoring Goal: 15-25mg /dL predialysis level for hemodialysis patients      Patient Parameters  Age: 66 y.o.  Wt Readings from Last 1 Encounters:   04/13/21 110.9 kg (244 lb 7.8 oz)         Serum creatinine: 4.6 mg/dL (H) 04/15/21 0310  Estimated creatinine clearance: 14.1 mL/min (A)  Recent Labs   Lab 04/15/21  0310 04/14/21  0341   Vancomycin Random 25.1 22.9       Pertinent Cultures:     Date Source  Organism & Pertinent Susceptibilities     04/13/21 Blood x2 NG x1 day    04/14/21 Wound Cx   Gram stain (+) Moderate GPCs                   As appropriate, contact physician to consider change in therapy if cultures grow organism other than MRSA       Assessment   Vancomycin therapy is for empiric treatment of gram positive cocci abd wall abscess  Patients renal function is not applicable as patient requires hemodialysis for ESRD   Vancomycin level is Within goal       Plan:   1. Give one time dose of 750 mg at 1800  since level is on the higher end of the goal range, and gram stain showing gram positive cocci, will give a decreased dose x1 post-HD  2. Next vancomycin level: Single random level ordered with AM labs 3/25  3. Check serum creatine: Daily  4. A pharmacist will continue to monitor vancomycin therapy daily    Thank you,  Milus Height, [facility]  Phone: [phone]

## 2021-04-15 NOTE — Progress Notes (Signed)
PROGRESS NOTE    Date Time: 04/15/21 7:15 AM  Patient Name: [name], [name]  Patient status: [facility] Day: 0      Assessment:   Abdominal wall abscess at PD catheter site.  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  Metabolic acidosis   Diabetes mellitus  Hypertension  Hyperlipidemia  Diabetic neuropathy  COPD  Obstructive sleep apnea  Severe obesity BMI 44.7  Full code   P  an:     Afebrile, nontoxic on IV Zosyn, vancomycin, ID following.  Appreciate surgery consultation I&D at bedside.  Dialysis per schedule.  Oxygenating normal 93% on room air.  Blood sugar improving, continue current dose of Lantus sliding scale coverage.  Blood pressure controlled on amlodipine  On aspirin, statin.  On gabapentin  Respiratory status stable, symptomatic treatment  Supportive care  DVT prophylaxis  Discharge planning when cleared by others  Labs and data reviewed.  Discussed with patient and nurse  Discussed with Dr. Vonna Drafts And Asif   Had D/w Dr.Sood   Subjective:   Patient seen and examined :  Looks comfortable, sitting on bed  No distress no fever no chills.    Review of Systems:   General ROS:   Afebrile.  Hemodynamically stable  Denies fever chills.  No shortness of breath, cough, wheezing.  Denies chest pain palpitation  No nausea vomiting, improved abdominal pain.  No focal neurological symptoms    Medications:     Current Facility-Administered Medications   Medication Dose Route Frequency    amLODIPine  10 mg Oral Daily    aspirin  81 mg Oral Daily    atorvastatin  40 mg Oral Daily    carvedilol  25 mg Oral Q12H [facility]    dextromethorphan-guaiFENesin  1 tablet Oral Q12H    gabapentin  100 mg Oral Q8H [facility]    insulin glargine  25 Units Subcutaneous QHS    insulin lispro  1-4 Units Subcutaneous QHS    insulin lispro  1-8 Units Subcutaneous TID AC     insulin lispro  10 Units Subcutaneous Once    lactobacillus/streptococcus  1 capsule Oral Daily    pantoprazole  40 mg Oral QAM AC    piperacillin-tazobactam  2.25 g Intravenous Q8H    sevelamer  800 mg Oral BID Meals    vancomycin   Intravenous See Admin Instructions       acetaminophen, albuterol sulfate HFA, dextrose **OR** dextrose **OR** dextrose **OR** glucagon (rDNA), diphenhydrAMINE, melatonin, morphine, naloxone, ondansetron **OR** ondansetron, oxyCODONE-acetaminophen, petrolatum    Physical Exam:   BP 111/61   Pulse 63   Temp 97.9 F (36.6 C) (Oral)   Resp 17   Ht 1.575 m (5\' 2" )   Wt 110.9 kg (244 lb 7.8 oz)   LMP  (LMP Unknown) Comment: post menopausal  SpO2 93%   BMI 44.72 kg/m     Intake and Output Summary (Last 24 hours) at Date Time    Intake/Output Summary (Last 24 hours) at 04/15/2021 0715  Last data filed at 04/15/2021 0600  Gross per 24 hour   Intake 360 ml   Output 3300 ml   Net -2940 ml         General: Awake alert oriented x3.  Neck: Supple no JVD  ENT: PERRLA, EOMI  Lungs: Clear to auscultation no crackles no wheezing  Heart: Regular rate and rhythm no gallop  Abdomen: Soft nontender, small skin breakdown with serosanguineous drainage.  Extremities: No gross focal deficit  Neuro: Grossly intact  GU: No CVA tenderness, dialysis dependent    Labs:     CBC w/Diff CMP   Recent Labs   Lab 04/15/21  0310 04/14/21  0341 04/13/21  1526   WBC 5.10 4.05 5.06   Hgb 11.8 11.3* 12.2   Hematocrit 38.3 35.1 38.7   Platelets 88* 82* 90*   MCV 94.1 90.2 90.0   Neutrophils  --   --  72.7         PT/INR         Recent Labs   Lab 04/15/21  0310 04/14/21  0341 04/13/21  1526   Sodium 140 139 134*   Potassium 3.7 3.5 4.2   Chloride 104 104 99   CO2 27 25 19    BUN 29.0* 42.0* 69.0*   Creatinine 4.6* 7.3* 10.2*   Glucose 240* 178* 523*   Calcium 7.3* 7.5* 8.0*   Phosphorus  --   --  6.8*   Protein, Total  --   --  5.9*   Albumin  --   --  2.5*   AST (SGOT)  --   --  18   ALT  --   --  23   Alkaline  Phosphatase  --   --  229*   Bilirubin, Total  --   --  0.6        Glucose POCT   Recent Labs   Lab 04/15/21  0310 04/14/21  0341 04/13/21  1526   Glucose 240* 178* 523*                Rads:   No results found.      Barbaraann Faster, MD  04/15/2021    *This note was generated by the Epic EMR system/ Dragon speech recognition and may contain inherent errors or omissions not intended by the user. Grammatical errors, random word insertions, deletions, pronoun errors and incomplete sentences are occasional consequences of this technology due to software limitations. Not all errors are caught or corrected. If there are questions or concerns about the content of this note or information contained within the body of this dictation they should be addressed directly with the author for clarification

## 2021-04-15 NOTE — Plan of Care (Signed)
Problem: Renal Instability  Goal: Fluid and electrolyte balance are achieved/maintained  04/15/2021 1038 by Erin Hearing, RN  Outcome: Progressing  Flowsheets (Taken 04/15/2021 1038)  Fluid and electrolyte balance are achieved/maintained:   Assess and reassess fluid and electrolyte status   Monitor/assess lab values and report abnormal values  04/15/2021 1037 by Erin Hearing, RN  Outcome: Progressing  Flowsheets (Taken 04/15/2021 1037)  Fluid and electrolyte balance are achieved/maintained:   Monitor/assess lab values and report abnormal values   Assess and reassess fluid and electrolyte status     Problem: Patient Receiving Advanced Renal Therapies  Goal: Therapy access site remains intact  04/15/2021 1038 by Erin Hearing, RN  Outcome: Progressing  Flowsheets (Taken 04/15/2021 1038)  Therapy access site remains intact:   Change therapy access site dressing as needed   Assess therapy access site  04/15/2021 1037 by Erin Hearing, RN  Outcome: Progressing     Problem: Patient Receiving Advanced Renal Therapies  Goal: Therapy access site remains intact  04/15/2021 1038 by Erin Hearing, RN  Outcome: Progressing  Flowsheets (Taken 04/15/2021 1038)  Therapy access site remains intact:   Change therapy access site dressing as needed   Assess therapy access site  04/15/2021 1037 by Erin Hearing, RN  Outcome: Progressing

## 2021-04-15 NOTE — Progress Notes (Signed)
Patient is alert and oriented x4, scheduled meds administered and well tolerated. On 2L oxygen via nasal canula, no distress noted. Blood sugar checked, insulin coverage given as per sliding scale. Dialysis treatment done. Bedside  Incision and drainage of abdominal wall abscess done, dressing intact. Continues on IV ABX. Will continue with the treatment plan of care.

## 2021-04-16 DIAGNOSIS — L02211 Cutaneous abscess of abdominal wall: Principal | ICD-10-CM

## 2021-04-16 LAB — BASIC METABOLIC PANEL
Anion Gap: 7 (ref 5.0–15.0)
BUN: 26 mg/dL — ABNORMAL HIGH (ref 7.0–21.0)
CO2: 28 mEq/L (ref 17–29)
Calcium: 7.4 mg/dL — ABNORMAL LOW (ref 8.5–10.5)
Chloride: 105 mEq/L (ref 99–111)
Creatinine: 4.1 mg/dL — ABNORMAL HIGH (ref 0.4–1.0)
Glucose: 220 mg/dL — ABNORMAL HIGH (ref 70–100)
Potassium: 4 mEq/L (ref 3.5–5.3)
Sodium: 140 mEq/L (ref 135–145)

## 2021-04-16 LAB — CBC
Absolute NRBC: 0 10*3/uL (ref 0.00–0.00)
Hematocrit: 40.1 % (ref 34.7–43.7)
Hgb: 11.9 g/dL (ref 11.4–14.8)
MCH: 28.5 pg (ref 25.1–33.5)
MCHC: 29.7 g/dL — ABNORMAL LOW (ref 31.5–35.8)
MCV: 96.2 fL — ABNORMAL HIGH (ref 78.0–96.0)
MPV: 13.3 fL — ABNORMAL HIGH (ref 8.9–12.5)
Nucleated RBC: 0 /100 WBC (ref 0.0–0.0)
Platelets: 93 10*3/uL — ABNORMAL LOW (ref 142–346)
RBC: 4.17 10*6/uL (ref 3.90–5.10)
RDW: 14 % (ref 11–15)
WBC: 4.99 10*3/uL (ref 3.10–9.50)

## 2021-04-16 LAB — VANCOMYCIN, RANDOM: Vancomycin Random: 24.7 ug/mL

## 2021-04-16 LAB — GLUCOSE WHOLE BLOOD - POCT
Whole Blood Glucose POCT: 151 mg/dL — ABNORMAL HIGH (ref 70–100)
Whole Blood Glucose POCT: 189 mg/dL — ABNORMAL HIGH (ref 70–100)
Whole Blood Glucose POCT: 217 mg/dL — ABNORMAL HIGH (ref 70–100)
Whole Blood Glucose POCT: 327 mg/dL — ABNORMAL HIGH (ref 70–100)

## 2021-04-16 LAB — GFR: EGFR: 13.2

## 2021-04-16 MED ORDER — VANCOMYCIN HCL IN NACL 750-0.9 MG/250ML-% IV SOLN
750.0000 mg | Freq: Once | INTRAVENOUS | Status: AC
Start: 2021-04-16 — End: 2021-04-16
  Administered 2021-04-16: 750 mg via INTRAVENOUS
  Filled 2021-04-16: qty 750

## 2021-04-16 MED ORDER — SODIUM CHLORIDE 0.9 % IV BOLUS
250.0000 mL | INTRAVENOUS | Status: AC | PRN
Start: 2021-04-16 — End: 2021-04-16

## 2021-04-16 MED ORDER — SODIUM CHLORIDE 0.9 % IV BOLUS
100.0000 mL | INTRAVENOUS | Status: AC | PRN
Start: 2021-04-16 — End: 2021-04-16

## 2021-04-16 NOTE — Progress Notes (Signed)
Office [id number]    ASSESSMENT:  66 y.o. female with a subcutaneous abscess along prior PD catheter site, now s/p I&D.    PLAN:  -- Dressing changed this AM without any bleeding.  -- Daily dressing changed by nursing.  -- Consider WOCN consult  -- Will sign-off, please call w/ questions    This patient was personally seen and examined by me and I agree with the assessment and plan above.  All notes, labs, and xrays were reviewed.  The patient was seen on the date the note was written.        Iran Sizer, MD , MD [facility]  [facility]  [address]. [place], [place] [address]  [phone]          SUBJECTIVE:  Doing well. Had some issues with bleeding from I&D site which stopped with reinforcement of dressing. No further issues with bleeding today.    OBJECTIVE:  Vitals:    04/15/21 1921 04/16/21 0024 04/16/21 0619 04/16/21 0736   BP: 159/67 121/66 118/63 113/71   Pulse: 74 69 61 60   Resp: 18 18 18 20    Temp: 98.4 F (36.9 C) 98.1 F (36.7 C) 97.5 F (36.4 C) 97.9 F (36.6 C)   TempSrc: Oral Oral Oral Oral   SpO2: 94% 94% 95% 95%   Weight:       Height:         Gen: AAOX3, sitting comfortably in bed, in NAD  HEENT: EOMI  CV: RRR  Pulm: CTAB  Abd: Soft, nondistended, nontender, no rebound or guarding, keloid scar with center incision now clean and dry without any bleeding, no more purulence  Ext: MAE spontaneously  Neuro: grossly normal, no focal deficits  Skin: warm and dry    LABS:  Lab Results   Component Value Date    WBC 4.99 04/16/2021    HGB 11.9 04/16/2021    HCT 40.1 04/16/2021    MCV 96.2 (H) 04/16/2021    PLT 93 (L) 04/16/2021     Recent Labs   Lab 04/16/21  0624   Sodium 140   Potassium 4.0   Chloride 105   CO2 28   BUN 26.0*   Creatinine 4.1*   EGFR 13.2   Glucose 220*   Calcium 7.4*       Lab Results   Component Value Date    ALT 23 04/13/2021    AST 18 04/13/2021    ALKPHOS 229 (H) 04/13/2021    BILITOTAL 0.6 04/13/2021       RADIOLOGY:  XR Chest  AP  Portable    Result Date: 04/13/2021   Stable cardiac enlargement without radiographic evidence of acute edema nor pneumonia at this time. Isabelle Course, MD 04/13/2021 4:34 PM    CT Abdomen Pelvis WO IV/ WO PO Cont    Result Date: 04/13/2021  1.Focal 3.5 x 3.2 x 3.2 cm fluid collection with surrounding fat stranding and a focus of gas extending from the abdominal wall to the skin surface, likely the site of the prior peritoneal dialysis catheter. Findings likely represent active inflammation or a developing abscess. Clinical correlation is recommended. 2.Extensive diffuse anasarca. Mild abdominal ascites. Trace bilateral pleural effusions. 3.Nonspecific left inguinal lymphadenopathy, possibly reactive. 4.Cholelithiasis. 5.Small hiatal hernia. 6.Leiomyomatous uterus. 7.Cardiomegaly. Cynda Familia, MD 04/13/2021 4:17 PM

## 2021-04-16 NOTE — Plan of Care (Addendum)
NURSING SHIFT NOTE     Patient: [name]  Day: 0      SHIFT EVENTS     Shift Narrative/Significant Events (PRN med administration, fall, RRT, etc.):    Pt A&Ox4. VSS. RA. Abscess sit CDI. Percocet was given for pain. She was drowsy during her dialysis. Gabapentin was on hold.   Safety and fall precautions remain in place. Purposeful rounding completed.          ASSESSMENT     Changes in assessment from patient's baseline this shift:    Neuro: No  CV: No  Pulm: No  Peripheral Vascular: No  HEENT: No  GI: No  BM during shift: No, Last BM: Last BM Date: 04/16/21  GU: No   Integ: No  MS: No    Pain: Improved  Pain Interventions: Medications  Medications Utilized: Percocet oral    Mobility: PMP Activity: Step 6 - Walks in Room of [employer] (ft) (Step 6,7): 0 Feet           Lines     Patient Lines/Drains/Airways Status       Active Lines, Drains and Airways       Name Placement date Placement time Site Days    Peripheral IV 04/14/21 22 G Right;Posterior Hand 04/14/21  1914  Hand  1    External Urinary Catheter 04/13/21  2045  --  2    Graft/Fistula Left --  --  -- --                         VITAL SIGNS     Vitals:    04/16/21 1905   BP: 135/86   Pulse: 72   Resp: 18   Temp: 97.9 F (36.6 C)   SpO2: 94%       Temp  Min: 97.3 F (36.3 C)  Max: 98.4 F (36.9 C)  Pulse  Min: 58  Max: 74  Resp  Min: 18  Max: 21  BP  Min: 95/55  Max: 159/67  SpO2  Min: 94 %  Max: 96 %      Intake/Output Summary (Last 24 hours) at 04/16/2021 1907  Last data filed at 04/16/2021 1337  Gross per 24 hour   Intake 400 ml   Output 2406 ml   Net -2006 ml              CARE PLAN        Problem: Renal Instability  Goal: Fluid and electrolyte balance are achieved/maintained  Outcome: Progressing  Flowsheets (Taken 04/16/2021 0936)  Fluid and electrolyte balance are achieved/maintained:   Monitor intake and output every shift   Monitor/assess lab values and report abnormal values   Monitor daily weight   Assess and reassess fluid and  electrolyte status     Problem: Patient Receiving Advanced Renal Therapies  Goal: Therapy access site remains intact  Outcome: Progressing  Flowsheets (Taken 04/16/2021 0936)  Therapy access site remains intact:   Assess therapy access site   Change therapy access site dressing as needed     Problem: Moderate/High Fall Risk Score >5  Goal: Patient will remain free of falls  Outcome: Progressing  Flowsheets (Taken 04/16/2021 0800)  Moderate Risk (6-13):   MOD-Perform dangle, stand, walk (DSW) prior to mobilization   MOD-Remain with patient during toileting   MOD-Floor mat at bedside (where available) if appropriate   MOD-Apply bed exit alarm if patient is confused   LOW-Anticoagulation  education for injury risk   LOW-Fall Interventions Appropriate for Low Fall Risk     Problem: Compromised Tissue integrity  Goal: Damaged tissue is healing and protected  Outcome: Progressing  Flowsheets (Taken 04/16/2021 0936)  Damaged tissue is healing and protected:   Monitor/assess Braden scale every shift   Increase activity as tolerated/progressive mobility   Avoid shearing injuries   Keep intact skin clean and dry   Use bath wipes, not soap and water, for daily bathing   Use incontinence wipes for cleaning urine, stool and caustic drainage. Foley care as needed   Monitor external devices/tubes for correct placement to prevent pressure, friction and shearing   Encourage use of lotion/moisturizer on skin   Monitor patient's hygiene practices  Goal: Nutritional status is improving  Outcome: Progressing  Flowsheets (Taken 04/16/2021 0936)  Nutritional status is improving: Allow adequate time for meals     Problem: Pain interferes with ability to perform ADL  Goal: Pain at adequate level as identified by patient  Outcome: Progressing  Flowsheets (Taken 04/16/2021 0936)  Pain at adequate level as identified by patient:   Identify patient comfort function goal   Assess for risk of opioid induced respiratory depression, including  snoring/sleep apnea. Alert healthcare team of risk factors identified.   Assess pain on admission, during daily assessment and/or before any "as needed" intervention(s)   Reassess pain within 30-60 minutes of any procedure/intervention, per Pain Assessment, Intervention, Reassessment (AIR) Cycle   Evaluate if patient comfort function goal is met   Evaluate patient's satisfaction with pain management progress   Offer non-pharmacological pain management interventions     Problem: Side Effects from Pain Analgesia  Goal: Patient will experience minimal side effects of analgesic therapy  Outcome: Progressing     Problem: Safety  Goal: Patient will be free from injury during hospitalization  Outcome: Progressing  Flowsheets (Taken 04/16/2021 0936)  Patient will be free from injury during hospitalization:   Assess patient's risk for falls and implement fall prevention plan of care per policy   Provide and maintain safe environment   Use appropriate transfer methods   Ensure appropriate safety devices are available at the bedside   Include patient/ family/ care giver in decisions related to safety   Hourly rounding  Goal: Patient will be free from infection during hospitalization  Outcome: Progressing  Flowsheets (Taken 04/16/2021 0936)  Free from Infection during hospitalization:   Assess and monitor for signs and symptoms of infection   Monitor lab/diagnostic results   Monitor all insertion sites (i.e. indwelling lines, tubes, urinary catheters, and drains)   Encourage patient and family to use good hand hygiene technique     Problem: Compromised Hemodynamic Status  Goal: Vital signs and fluid balance maintained/improved  Outcome: Progressing     Problem: Inadequate Gas Exchange  Goal: Adequate oxygenation and improved ventilation  Outcome: Progressing     Problem: Infection  Goal: Free from infection  Outcome: Progressing

## 2021-04-16 NOTE — Progress Notes (Signed)
Pre-report received from primary nurse. Patient is A/O X 4. Patient denies sob.Universal protocol and safety checks done. AVF on left upper arm with + B/T, no signs and symptoms of infection. Teaching done. HD started. Will continue to monitor patient.      04/16/21 0940   Bedside Nurse Communication   Name of bedside RN - pre dialysis Ayele,Selamawit,RN   Treatment Initiation- With Dialysis Precautions   Time Out/Safety Check Completed Yes   Consent for HD signed for this hospitalization (Date) 02/04/21   Consent for HD signed for this hospitalization (Time) 1450   Preferred language Yes   Complication waiver N   Blood Consent Verified N/A   Dialysis Precautions All Connections Secured   Dialysis Treatment Type Routine   Special Considerations daily   Is patient diabetic? Yes   RO/Hemodialysis Architectural technologist   Is Total Chlorine less than 0.1 ppm? Yes   Orignial Total Chlorine Testing Time 0935   At 4 Hour Total Chlorine Testing Time 1335   RO/Hemodialysis Facilities manager Number 4    Machine Serial Number [id number]   RO # 25   RO Serial # [id number]   Water Hardness NA   pH 7.2   Pressure Test Verified Yes   Alarms Verified Passed   Machine Temperature 96.8 F (36 C)   Alarms Verified Yes   Na+ mEq (Machine) 138 mEq   Bicarb mEq (Machine) 35 mEq   Hemodialysis Conductivity (Machine) 13.8   Hemodialysis Conductivity (Meter) 13.8   Dialyzer Lot Number [id number] EXP-02/23/2024   Tubing Lot Number [id number] EXP-01/23/2024   RO Machine Log Completed Yes   Hepatitis Status   HBsAg (Antigen) Result Negative   HBsAg Date Drawn 04/13/21   HBsAg Repeat Draw Due Date 05/07/21   Dialysis Weight   Pre-Treatment Weight (Kg) 110.9   Scale Type ICU Bed Scale   Vitals   Temp 97.3 F (36.3 C)   Heart Rate 61   Resp Rate 18   BP 103/61   O2 Device None (Room air)   Assessment   Mental Status Alert;Oriented;Cooperative   Cardiac (WDL) WDL   Cardiac Regularity Regular   Cardiac Symptoms None   Cardiac Rhythm Normal  Sinus Rhythm   Respiratory  WDL   Respiratory Pattern Regular   Bilateral Breath Sounds Clear;Diminished   R Breath Sounds Clear;Diminished   L Breath Sounds Clear;Diminished   Cough Spontaneous   Edema  WDL   Generalized Edema Non Pitting Edema   Facial Non Pitting Edema   Sacral UTA   RLE Edema Non Pitting Edema   LLE Edema Non Pitting Edema   General Skin Color Appropriate for ethnicity   Skin Condition/Temp Warm;Dry   Gastrointestinal (WDL) X   Abdomen Inspection Soft;Rounded   GI Symptoms None   Mobility Bed   Pain Assessment   Charting Type Assessment   Pain Scale Used Numeric Scale (0-10)   Numeric Pain Scale   Pain Score 6   POSS Score 1   Pain Location Abdomen   Pain Orientation Left   Pain Descriptors Aching   Pain Frequency Intermittent   Effect of Pain on Daily Activities moderate   Patient's Stated Comfort Functional Goal 1   Pain Intervention(s) Medication;Rest   Multiple Pain Sites No   Hemodialysis Comments   Pre-Hemodialysis Comments Timeout and safety checks completed

## 2021-04-16 NOTE — Progress Notes (Signed)
PROGRESS NOTE    Date Time: 04/16/21 9:50 AM  Patient Name: [name], [name]  Patient status: [facility] Day: 0      Assessment:   Abdominal wall abscess at PD catheter site. 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  Metabolic acidosis   Diabetes mellitus  Hypertension  Hyperlipidemia  Diabetic neuropathy  COPD  Obstructive sleep apnea  Severe obesity BMI 44.7  Full code   P  an:     Status post drainage, growing Staph aureus on Zosyn and vancomycin, ID following.  Appreciate surgery consultation.  Dialysis per schedule.  Oxygen saturation normal on room air.  Continue Lantus, sliding scale coverage.  BP controlled on amlodipine  On aspirin, statin.  On gabapentin  Respiratory status stable, symptomatic treatment  Supportive care  DVT prophylaxis  DCP anticipated in 1 to 2 days depending upon culture results.  Labs and data reviewed.  Discussed with patient and nurse  Discussed with dialysis nurse    Subjective:   Patient seen and examined :  Looks comfortable no acute distress.  Status post incision and drainage.  Wound culture growing Staph aureus.  Stated had large amount of drainage after incision and drainage  Review of Systems:   General ROS:   Afebrile.  No fever or chills.  No chest pain palpitation  No shortness of breath, cough, wheezing.  Denies nausea vomiting abdominal pain.  No focal neurological symptoms.    Medications:     Current Facility-Administered Medications   Medication Dose Route Frequency    amLODIPine  10 mg Oral Daily    aspirin  81 mg Oral Daily    atorvastatin  40 mg Oral Daily    carvedilol  25 mg Oral Q12H [facility]    dextromethorphan-guaiFENesin  1 tablet Oral Q12H    gabapentin  100 mg Oral Q8H [place]    insulin glargine  25 Units Subcutaneous QHS    insulin lispro  1-4  Units Subcutaneous QHS    insulin lispro  1-8 Units Subcutaneous TID AC    insulin lispro  10 Units Subcutaneous Once    lactobacillus/streptococcus  1 capsule Oral Daily    lidocaine-EPINEPHrine  20 mL Other Once    pantoprazole  40 mg Oral QAM AC    piperacillin-tazobactam  2.25 g Intravenous Q12H [facility]    sevelamer  800 mg Oral TID MEALS    vancomycin   Intravenous See Admin Instructions       acetaminophen, albuterol sulfate HFA, dextrose **OR** dextrose **OR** dextrose **OR** glucagon (rDNA), diphenhydrAMINE, HYDROmorphone, melatonin, naloxone, ondansetron **OR** ondansetron, oxyCODONE-acetaminophen, petrolatum, sodium chloride, sodium chloride    Physical Exam:   BP 113/71   Pulse 60   Temp 97.9 F (36.6 C) (Oral)   Resp 20   Ht 1.575 m (5\' 2" )   Wt 110.9 kg (244 lb 7.8 oz)   LMP  (LMP Unknown) Comment: post menopausal  SpO2 95%   BMI 44.72 kg/m     Intake and Output Summary (Last 24 hours) at Date Time    Intake/Output Summary (Last 24 hours) at 04/16/2021 0950  Last data filed at 04/16/2021 0600  Gross per 24 hour   Intake 400 ml   Output 4100 ml   Net -3700 ml  General: Awake alert oriented x3.  Neck: Supple no JVD  ENT: PERRLA, EOMI  Lungs: CTA no crackles/wheezing  Heart: RRR no gallop  Abdomen: Soft nontender bowel sounds positive  Dressing In Place   Extremities: Warm moist  Neuro: Grossly intact  GU: No CVA tenderness.    Labs:     CBC w/Diff CMP   Recent Labs   Lab 04/16/21  0624 04/15/21  0310 04/14/21  0341 04/13/21  1526   WBC 4.99 5.10 4.05 5.06   Hgb 11.9 11.8 11.3* 12.2   Hematocrit 40.1 38.3 35.1 38.7   Platelets 93* 88* 82* 90*   MCV 96.2* 94.1 90.2 90.0   Neutrophils  --   --   --  72.7         PT/INR         Recent Labs   Lab 04/16/21  0624 04/15/21  0310 04/14/21  0341 04/13/21  1526   Sodium 140 140 139 134*   Potassium 4.0 3.7 3.5 4.2   Chloride 105 104 104 99   CO2 28 27 25 19    BUN 26.0* 29.0* 42.0* 69.0*   Creatinine 4.1* 4.6* 7.3* 10.2*   Glucose 220* 240* 178* 523*    Calcium 7.4* 7.3* 7.5* 8.0*   Phosphorus  --   --   --  6.8*   Protein, Total  --   --   --  5.9*   Albumin  --   --   --  2.5*   AST (SGOT)  --   --   --  18   ALT  --   --   --  23   Alkaline Phosphatase  --   --   --  229*   Bilirubin, Total  --   --   --  0.6        Glucose POCT   Recent Labs   Lab 04/16/21  0624 04/15/21  0310 04/14/21  0341 04/13/21  1526   Glucose 220* 240* 178* 523*                Rads:   No results found.      Barbaraann Faster, MD  04/16/2021    *This note was generated by the Epic EMR system/ Dragon speech recognition and may contain inherent errors or omissions not intended by the user. Grammatical errors, random word insertions, deletions, pronoun errors and incomplete sentences are occasional consequences of this technology due to software limitations. Not all errors are caught or corrected. If there are questions or concerns about the content of this note or information contained within the body of this dictation they should be addressed directly with the author for clarification

## 2021-04-16 NOTE — Progress Notes (Signed)
Progress Note- Pharmacy Vancomycin Dosing Consult:  Day 4  Vancomycin Indication: Abdominal wall abscess at PD catheter site  Vancomycin Monitoring Goal: Pre-dialysis 15 - 25 mg/L for hemodialysis patients    Pertinent Cultures:     Date Source  Organism & Pertinent Susceptibilities     3/22 Blood x2 NG x2d   3/23 Wound Staphylococcus aureus (suscep pending)             As appropriate, contact physician to consider change in therapy if cultures grow organism other than MRSA      Serum creatinine: 4.1 mg/dL (H) 04/16/21 7847  Estimated creatinine clearance: 15.9 mL/min (A)    Baseline SCr: HD MWF    Nephrotoxic drugs administered in the past 48 hours: Advanced Age (>65Y), Piperacillin/tazobactam    Assessment (Copy summary from [employer] for AUC/MIC dosing)  Measured Vancomycin Level:   Recent Labs   Lab 04/16/21  0624 04/15/21  0310 04/14/21  0341   Vancomycin Random 24.7 25.1 22.9         Recommendations:  HD scheduled today. Normal schedule is MWF.  Vancomycin level of 24.7 is therapeutic  One-time vancomycin dose today: 750 mg at 1800  Vancomycin random level ordered: 3/27 with AM labs      Thank you,  Leeroy Cha, [facility]

## 2021-04-16 NOTE — Progress Notes (Signed)
[facility] PROGRESS NOTE  [id number]      Date Time: 04/16/21 6:56 AM  Patient Name: [name]  Attending Physician: Barbaraann Faster, MD    CC: follow-up ESRD    Assessment:     ESRD on HD MWF at [facility] - followed by Dr. 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 HD again today then Monday  2. IV abx, monitor vanc levels   3. Surgery following    Case discussed with: HD RN, pt      Corky Mull, MD  [facility]  [phone] (office)      Subjective:   On RA  S/p HD yesterday 4.1L removed  Feeling better but having some pain and bleeding from I&D site    Review of Systems:   No sob    Physical Exam:     Vitals:    04/15/21 1458 04/15/21 1921 04/16/21 0024 04/16/21 0619   BP: 117/66 159/67 121/66 118/63   Pulse: 61 74 69 61   Resp: 18 18 18 18    Temp: 97.3 F (36.3 C) 98.4 F (36.9 C) 98.1 F (36.7 C) 97.5 F (36.4 C)   TempSrc: Oral Oral Oral Oral   SpO2: 95% 94% 94% 95%   Weight:       Height:           Intake and Output Summary (Last 24 hours) at Date Time    Intake/Output Summary (Last 24 hours) at 04/16/2021 0656  Last data filed at 04/16/2021 0600  Gross per 24 hour   Intake 400 ml   Output 4100 ml   Net -3700 ml         General:  NAD  Cardiovascular: regular rate and rhythm  Lungs: clear to auscultation bilaterally, bilateral air entry, normal work of breathing  Abdomen: soft  Extremities: chronic LE edema    Meds:      Scheduled Meds: PRN Meds:    amLODIPine, 10  mg, Oral, Daily  aspirin, 81 mg, Oral, Daily  atorvastatin, 40 mg, Oral, Daily  carvedilol, 25 mg, Oral, Q12H [facility]  dextromethorphan-guaiFENesin, 1 tablet, Oral, Q12H  gabapentin, 100 mg, Oral, Q8H [facility]  insulin glargine, 25 Units, Subcutaneous, QHS  insulin lispro, 1-4 Units, Subcutaneous, QHS  insulin lispro, 1-8 Units, Subcutaneous, TID AC  insulin lispro, 10 Units, Subcutaneous, Once  lactobacillus/streptococcus, 1 capsule, Oral, Daily  lidocaine-EPINEPHrine, 20 mL, Other, Once  pantoprazole, 40 mg, Oral, QAM AC  piperacillin-tazobactam, 2.25 g, Intravenous, Q12H [facility]  sevelamer, 800 mg, Oral, TID MEALS  vancomycin, , Intravenous, See Admin Instructions          Continuous Infusions:   acetaminophen, 500 mg, Q6H PRN  albuterol sulfate HFA, 2 puff, Q6H PRN  dextrose, 15 g of glucose, PRN   Or  dextrose, 12.5 g, PRN   Or  dextrose, 12.5 g, PRN   Or  glucagon (rDNA), 1 mg, PRN  diphenhydrAMINE, 6.25 mg, Q6H  PRN  HYDROmorphone, 0.4 mg, Once PRN  melatonin, 3 mg, QHS PRN  naloxone, 0.2 mg, PRN  ondansetron, 4 mg, Q8H PRN   Or  ondansetron, 4 mg, Q8H PRN  oxyCODONE-acetaminophen, 1 tablet, Q4H PRN  petrolatum, , PRN              Labs:     Recent Labs   Lab 04/15/21  0310 04/14/21  0341 04/13/21  1526   WBC 5.10 4.05 5.06   Hgb 11.8 11.3* 12.2   Hematocrit 38.3 35.1 38.7   Platelets 88* 82* 90*       Recent Labs   Lab 04/15/21  0310 04/14/21  0341 04/13/21  1526   Sodium 140 139 134*   Potassium 3.7 3.5 4.2   Chloride 104 104 99   CO2 27 25 19    BUN 29.0* 42.0* 69.0*   Creatinine 4.6* 7.3* 10.2*   Calcium 7.3* 7.5* 8.0*   Albumin  --   --  2.5*   Phosphorus  --   --  6.8*   Glucose 240* 178* 523*   EGFR 11.5 6.8 4.6         Recent Labs   Lab 04/14/21  0755   Urine Type Urine, Clean Ca   Color, UA Yellow   Clarity, UA Hazy   Specific Gravity UA 1.026   Urine pH 6.0   Nitrite, UA Negative   Ketones UA Trace*   Urobilinogen, UA Negative   Bilirubin, UA Negative   Blood, UA Small*   RBC, UA 3 - 5   WBC, UA 0 - 5              Signed by: Corky Mull, MD

## 2021-04-16 NOTE — Progress Notes (Signed)
Patient completed 3.5 hours of treatment.Patient has soft BP during treatment.Goal was decreased. MD aware.Patient denies sob.Total net removed 2.4 L. Blood volume processed 71 .AVF working well with + B/T. Held access for 5 minutes each site. No bleeding noted. Report was given to primary nurse.    04/16/21 1337   Treatment Summary   Time Off Machine 1322   Duration of Treatment (Hours) 3.5   Treatment Type 1:1   Dialyzer Clearance Clear   Fluid Volume Off (mL) 2806   Prime Volume (mL) 200   Rinseback Volume (mL) 200   Fluid Given: Normal Saline (mL) 0   Fluid Given: PRBC  0 mL   Fluid Given: Albumin (mL) 0   Fluid Given: Other (mL) 0   Total Fluid Given 400   Hemodialysis Net Fluid Removed 2406   Post Treatment Assessment   Post-Treatment Weight (Kg) -2.4   Patient Response to Treatment tolerated treatment   Vitals   Temp 97.6 F (36.4 C)   Heart Rate 61   Resp Rate 18   BP 117/56   O2 Device None (Room air)   Assessment   Mental Status Alert;Oriented;Cooperative   Cardiac (WDL) WDL   Cardiac Regularity Regular   Cardiac Symptoms None   Cardiac Rhythm Normal Sinus Rhythm   Respiratory  WDL   Respiratory Pattern Regular   Bilateral Breath Sounds Clear   R Breath Sounds Clear;Diminished   L Breath Sounds Clear;Diminished   Cough Spontaneous   Edema  WDL   Generalized Edema Non Pitting Edema   Facial Non Pitting Edema   Sacral UTA   RLE Edema Non Pitting Edema   LLE Edema Non Pitting Edema   General Skin Color Appropriate for ethnicity   Skin Condition/Temp Warm;Dry   Gastrointestinal (WDL) X   Abdomen Inspection Soft;Rounded   GI Symptoms None   Mobility Bed   Pain Assessment   Charting Type Assessment   Pain Scale Used Numeric Scale (0-10)   Numeric Pain Scale   Pain Score 4   POSS Score 1   Pain Location Abdomen   Pain Orientation Left   Pain Descriptors Aching   Pain Frequency Intermittent   Effect of Pain on Daily Activities moderate   Patient's Stated Comfort Functional Goal 1   Pain Intervention(s)  Medication;Rest   Multiple Pain Sites No   Education   Person taught Patient   Knowledge basis Substantial   Topics taught Fluid Management;Procedure   Teaching Tools Explain   [name] Understanding   Bedside Nurse Communication   Name of bedside RN - post dialysis Ayele,Selamawit,RN

## 2021-04-16 NOTE — Progress Notes (Signed)
04/16/21 0947   Vitals   Temp 97.3 F (36.3 C)   Heart Rate 63   Resp Rate 18   BP 107/56   O2 Device None (Room air)   Machine Metrics   $Treatment Started/Capturing Charge Yes   Blood Flow Rate (mL/min) 350 mL/min   Arterial Pressure (mmHg) -180 mmHg   Venous Pressure (mmHg) 160   Dialysate Flow Rate (mL/min) 700 mL/min   Transmembrane Pressure (mmHg) 40 mmHg   Ultrafiltration Rate (mL/Hr) 700 mL/hr   Fluid Removal (ml) 0 ml   Fluid Bolus (ml) 200 ml   Dialysate K (mEq/L) 3 mEq/L   Dialysate CA (mEq/L) 2.5 mEq/L   Hemodialysis Comments   Arteriovenous Lines Secure Yes   Comments HD started

## 2021-04-16 NOTE — Plan of Care (Signed)
Problem: Renal Instability  Goal: Fluid and electrolyte balance are achieved/maintained  Outcome: Progressing  Flowsheets (Taken 04/16/2021 1006)  Fluid and electrolyte balance are achieved/maintained:   Monitor/assess lab values and report abnormal values   Monitor daily weight   Assess for confusion/personality changes   Assess and reassess fluid and electrolyte status   Observe for seizure activity and initiate seizure precautions if indicated

## 2021-04-16 NOTE — Plan of Care (Addendum)
Pt is S/P I&D on abdominal abscess; site has small bleeding noted;dressing reinforced. She c/o pain prn Oxycodone given with relief as verbalized by the patient & she was able to slept @ long interval. VSS;afebrile;on room air sats 96%;no SOB noted or sign of respiratory distress. Will continue plan of care- IV antibiotic & continue to assess pain level & treat as needed.    Problem: Infection  Goal: Free from infection  Outcome: Progressing  Flowsheets (Taken 04/16/2021 0046)  Free from infection:   Assess for signs/symptoms of infection   Utilize isolation precautions per protocol/policy   Assess immunization status   Consult/collaborate with Infection Preventionist   Utilize sepsis protocol     Problem: Inadequate Gas Exchange  Goal: Adequate oxygenation and improved ventilation  Outcome: Progressing  Flowsheets (Taken 04/16/2021 0045)  Adequate oxygenation and improved ventilation:   Assess lung sounds   Monitor SpO2 and treat as needed   Increase activity as tolerated/progressive mobility     Problem: Compromised Hemodynamic Status  Goal: Vital signs and fluid balance maintained/improved  Outcome: Progressing  Flowsheets (Taken 04/16/2021 0045)  Vital signs and fluid balance are maintained/improved:   Position patient for maximum circulation/cardiac output   Monitor/assess vitals and hemodynamic parameters with position changes   Monitor and compare daily weight   Monitor intake and output. Notify LIP if urine output is less than 30 mL/hour.   Monitor/assess lab values and report abnormal values     Problem: Pain interferes with ability to perform ADL  Goal: Pain at adequate level as identified by patient  Outcome: Progressing  Flowsheets (Taken 04/14/2021 0052 by Charolotte Capuchin, RN)  Pain at adequate level as identified by patient:   Assess for risk of opioid induced respiratory depression, including snoring/sleep apnea. Alert healthcare team of risk factors identified.   Identify patient comfort function goal    Assess pain on admission, during daily assessment and/or before any "as needed" intervention(s)   Reassess pain within 30-60 minutes of any procedure/intervention, per Pain Assessment, Intervention, Reassessment (AIR) Cycle   Evaluate if patient comfort function goal is met   Offer non-pharmacological pain management interventions   Evaluate patient's satisfaction with pain management progress     Problem: Side Effects from Pain Analgesia  Goal: Patient will experience minimal side effects of analgesic therapy  Outcome: Progressing  Flowsheets (Taken 04/14/2021 0052 by Charolotte Capuchin, RN)  Patient will experience minimal side effects of analgesic therapy:   Monitor/assess patient's respiratory status (RR depth, effort, breath sounds)   Assess for changes in cognitive function   Prevent/manage side effects per LIP orders (i.e. nausea, vomiting, pruritus, constipation, urinary retention, etc.)     Problem: Renal Instability  Goal: Fluid and electrolyte balance are achieved/maintained  Outcome: Progressing  Flowsheets (Taken 04/15/2021 1038 by Erin Hearing, RN)  Fluid and electrolyte balance are achieved/maintained:   Assess and reassess fluid and electrolyte status   Monitor/assess lab values and report abnormal values

## 2021-04-17 LAB — GLUCOSE WHOLE BLOOD - POCT
Whole Blood Glucose POCT: 282 mg/dL — ABNORMAL HIGH (ref 70–100)
Whole Blood Glucose POCT: 321 mg/dL — ABNORMAL HIGH (ref 70–100)
Whole Blood Glucose POCT: 292 mg/dL — ABNORMAL HIGH (ref 70–100)
Whole Blood Glucose POCT: 300 mg/dL — ABNORMAL HIGH (ref 70–100)

## 2021-04-17 LAB — PHOSPHORUS: Phosphorus: 2.7 mg/dL (ref 2.3–4.7)

## 2021-04-17 MED ORDER — SODIUM CHLORIDE 0.9 % IV BOLUS
250.0000 mL | INTRAVENOUS | Status: AC | PRN
Start: 2021-04-17 — End: 2021-04-18

## 2021-04-17 MED ORDER — ALBUMIN HUMAN 25 % IV SOLN
100.0000 mL | INTRAVENOUS | Status: AC | PRN
Start: 2021-04-17 — End: 2021-04-18
  Filled 2021-04-17: qty 100

## 2021-04-17 MED ORDER — INSULIN GLARGINE 100 UNIT/ML SC SOLN
28.0000 [IU] | Freq: Every evening | SUBCUTANEOUS | Status: DC
Start: 2021-04-17 — End: 2021-04-19
  Administered 2021-04-17 – 2021-04-18 (×2): 28 [IU] via SUBCUTANEOUS
  Filled 2021-04-17 (×2): qty 28

## 2021-04-17 MED ORDER — SODIUM CHLORIDE 0.9 % IV BOLUS
100.0000 mL | INTRAVENOUS | Status: AC | PRN
Start: 2021-04-17 — End: 2021-04-18

## 2021-04-17 NOTE — Plan of Care (Signed)
Pt c/o abd. Pain for which percocet given once      Problem: Renal Instability  Goal: Fluid and electrolyte balance are achieved/maintained  Flowsheets (Taken 04/16/2021 1006 by Sherlie Ban Pursima, RN)  Fluid and electrolyte balance are achieved/maintained:   Monitor/assess lab values and report abnormal values   Monitor daily weight   Assess for confusion/personality changes   Assess and reassess fluid and electrolyte status   Observe for seizure activity and initiate seizure precautions if indicated     Problem: Moderate/High Fall Risk Score >5  Goal: Patient will remain free of falls  Flowsheets (Taken 04/16/2021 0800 by Jamesetta So, RN)  Moderate Risk (6-13):   MOD-Perform dangle, stand, walk (DSW) prior to mobilization   MOD-Remain with patient during toileting   MOD-Floor mat at bedside (where available) if appropriate   MOD-Apply bed exit alarm if patient is confused   LOW-Anticoagulation education for injury risk   LOW-Fall Interventions Appropriate for Low Fall Risk     Problem: Pain interferes with ability to perform ADL  Goal: Pain at adequate level as identified by patient  Flowsheets (Taken 04/16/2021 0936 by Jamesetta So, RN)  Pain at adequate level as identified by patient:   Identify patient comfort function goal   Assess for risk of opioid induced respiratory depression, including snoring/sleep apnea. Alert healthcare team of risk factors identified.   Assess pain on admission, during daily assessment and/or before any "as needed" intervention(s)   Reassess pain within 30-60 minutes of any procedure/intervention, per Pain Assessment, Intervention, Reassessment (AIR) Cycle   Evaluate if patient comfort function goal is met   Evaluate patient's satisfaction with pain management progress   Offer non-pharmacological pain management interventions     Problem: Safety  Goal: Patient will be free from injury during hospitalization  Flowsheets (Taken 04/16/2021 0936 by Jamesetta So, RN)  Patient  will be free from injury during hospitalization:   Assess patient's risk for falls and implement fall prevention plan of care per policy   Provide and maintain safe environment   Use appropriate transfer methods   Ensure appropriate safety devices are available at the bedside   Include patient/ family/ care giver in decisions related to safety   [place]ourly rounding

## 2021-04-17 NOTE — Progress Notes (Signed)
PROGRESS NOTE     Date Time: 04/17/21 10:47 PM  Patient Name: [name],[name]      ASSESSMENT:   Acute on Chronic Thrombocytopenia, likely ITP- post infectious exacerbation- slowly improving  Chronic Normocytic Anemia, due to CKD - improving  ESRD on HD MWF, Missed HD for >1 week.- Cr improving  Abdominal wall abscess at PD catheter site. 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 Platelets are above threshold of intervention  Monitor H/H and Platelets daily.  Continue iron repletion with dialysis and ESA therapy  Continue antibiotics  Continue Wound Care  Continue home meds for comorbid conditions    Subjective:     No new complaints   Denies shortness of breath or chest pain   No Nausea or vomiting  No signs of bleeding   Afebrile       Medications:      Scheduled Meds: PRN Meds:    amLODIPine, 10 mg, Oral, Daily  aspirin, 81 mg, Oral, Daily  atorvastatin, 40 mg, Oral, Daily  carvedilol, 25 mg, Oral, Q12H [facility]  dextromethorphan-guaiFENesin, 1 tablet, Oral, Q12H  gabapentin, 100 mg, Oral, Q8H [facility]  insulin glargine, 28 Units, Subcutaneous, QHS  insulin lispro, 1-4 Units, Subcutaneous, QHS  insulin lispro, 1-8 Units, Subcutaneous, TID AC  insulin lispro, 10 Units, Subcutaneous, Once  lactobacillus/streptococcus, 1 capsule, Oral, Daily  lidocaine-EPINEPHrine, 20 mL, Other, Once  pantoprazole, 40 mg, Oral, QAM  AC  piperacillin-tazobactam, 2.25 g, Intravenous, Q12H [facility]  sevelamer, 800 mg, Oral, TID MEALS  vancomycin, , Intravenous, See Admin Instructions         acetaminophen, 500 mg, Q6H PRN  albumin human, 100 mL, PRN  albumin human, 100 mL, PRN  albuterol sulfate HFA, 2 puff, Q6H PRN  dextrose, 15 g of glucose, PRN   Or  dextrose, 12.5 g, PRN   Or  dextrose, 12.5 g, PRN   Or  glucagon (rDNA), 1 mg, PRN  diphenhydrAMINE, 6.25 mg, Q6H PRN  HYDROmorphone, 0.4 mg, Once PRN  melatonin, 3 mg, QHS PRN  naloxone, 0.2 mg, PRN  ondansetron, 4 mg, Q8H PRN   Or  ondansetron, 4 mg, Q8H PRN  oxyCODONE-acetaminophen, 1 tablet, Q4H PRN  petrolatum, , PRN  sodium chloride, 100 mL, Q1H PRN  sodium chloride, 250 mL, PRN          I personally reviewed all of the medications    Review of Systems:   Per narrative, 2 system review was done.    Physical Exam:   Visit Vitals  BP 144/78   Pulse 61   Temp 97.5 F (36.4 C) (Oral)   Resp 22   Ht 1.575 m (5\' 2" )   Wt 110.9 kg (244 lb 7.8 oz)   LMP  (LMP Unknown)   SpO2 95%   BMI 44.72 kg/m         Intake/Output Summary (  Last 24 hours) at 04/17/2021 2247  Last data filed at 04/17/2021 6060  Gross per 24 hour   Intake 650 ml   Output --   Net 650 ml            Physical Exam:      General: Obesity  Skin: no rashes or lesions.  Eye: PERRL  HENT: no scleral icterus, no lymphadenopathy.  Lungs: non-labored respiration.  Heart: normal rate, regular rhythm, no murmur.  Abdomen: soft, non-tender. PD site noted with erythema.  Musculoskeletal: no tenderness and swelling  Neurologic: awake, alert, and oriented X3, non-focal exam.          Labs:     Recent Labs   Lab 04/17/21  0407 04/16/21  0624 04/15/21  0310 04/14/21  0341 04/13/21  1526   Glucose  --  220* 240* 178* 523*   BUN  --  26.0* 29.0* 42.0* 69.0*   Creatinine  --  4.1* 4.6* 7.3* 10.2*   Calcium  --  7.4* 7.3* 7.5* 8.0*   Sodium  --  140 140 139 134*   Potassium  --  4.0 3.7 3.5 4.2   Chloride  --  105 104 104 99   CO2  --  28 27 25 19    Albumin  --    --   --   --  2.5*   Phosphorus 2.7  --   --   --  6.8*   AST (SGOT)  --   --   --   --  18   ALT  --   --   --   --  23   Bilirubin, Total  --   --   --   --  0.6   Alkaline Phosphatase  --   --   --   --  229*       Recent Labs   Lab 04/16/21  0624 04/15/21  0310 04/14/21  0341   WBC 4.99 5.10 4.05   Hgb 11.9 11.8 11.3*   Hematocrit 40.1 38.3 35.1   MCV 96.2* 94.1 90.2   MCH 28.5 29.0 29.0   MCHC 29.7* 30.8* 32.2   Platelets 93* 88* 82*         No results for input(s): PTT, PT, INR in the last 72 hours.      Microbiology:  Microbiology Results (last 15 days)       Procedure Component Value Units Date/Time    Culture + Gram Lenise Arena Wound [[id number]] Collected: 04/14/21 1609    Order Status: Completed Specimen: Wound Updated: 04/17/21 2031    Narrative:      [id number] called Micro Results of MRSA. Read back [id number], by [id number] on  04/17/2021 at 20:30  ESwab  ORDER#: [id number]                                    ORDERED BY: CHOUDHARY, IMTI  SOURCE: Wound Abdominal wall wound                   COLLECTED:  04/14/21 16:09  ANTIBIOTICS AT COLL.:                                RECEIVED :  04/14/21 22:37  ORDER ENTRY COMMENTS:  ESwab  [id number] called Micro Results of MRSA. Read back [id number], by [phone] on  04/17/2021 at 20:30  Stain, Gram                                FINAL       04/15/21 00:03  04/15/21   No Squamous epithelial cells seen             No WBCs or organisms seen  Culture and Gram Stain, Aerobic, Wound     FINAL       04/17/21 20:31   +  04/15/21   Heavy growth of Staphylococcus aureus               Refer to susceptibilities on culture #[id number]        Culture + Gram Lenise Arena, Wound [[id number]] Collected: 04/14/21 1201    Order Status: Completed Specimen: Wound Updated: 04/17/21 1648    Narrative:      Oren Binet Culturette  ORDER#: [id number]                                    ORDERED BY: Gustavus Messing, IMTI  SOURCE: Wound abdomen                                COLLECTED:  04/14/21 12:01  ANTIBIOTICS AT COLL.:                                 RECEIVED :  04/14/21 15:22  ORDER ENTRY COMMENTS:  Blue Culturette  Stain, Gram                                FINAL       04/14/21 16:55   +  04/14/21   No Squamous epithelial cells seen             Many WBCs             Moderate Gram positive cocci  Culture and Gram Stain, Aerobic, Wound     FINAL       04/17/21 16:48   +  04/17/21   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      _____________________________________________________________________________                                      MRSA        ANTIBIOTICS                     MIC  INTRP      _____________________________________________________________________________  Clindamycin                    <=0.5   S        Daptomycin                      <=1    S        Erythromycin                    >4     R        Gentamicin                      <=  2    S        Levofloxacin                     4     I        Linezolid                        2     S        Oxacillin                       >2     R  D1    Penicillin                      >1     R        Rifampin                       <=0.5   S        Tetracycline                   <=0.5   S        Trimethoprim/Sulfamethoxazole <=0.5/9  S        Vancomycin                       1     S          -----DRUG COMMENTS----------    D1:  Oxacillin sensitivity predicts sensitivity to cephalosporins.         [place] [employer] 2016  _____________________________________________________________________________            S=SUSCEPTIBLE     I=INTERMEDIATE     R=RESISTANT       N/S=NON-SUSCEPTIBLE     SDD=SUSCEPTIBLE-DOSE DEPENDENT  _____________________________________________________________________________      COVID-19 (SARS-CoV-2) and Influenza A/B, NAA (Liat Rapid)- Admission [[id number]] Collected: 04/13/21 1529    Order Status: Completed Specimen: Culturette from Nasopharyngeal Updated: 04/13/21 1615     Purpose of COVID testing Diagnostic -[facility]     SARS-CoV-2 Specimen  Source Nasal Swab     SARS CoV 2 Overall Result Not Detected     Comment: __________________________________________________  -A result of "Detected" indicates POSITIVE for the    presence of SARS CoV-2 RNA  -A result of "Not Detected" indicates NEGATIVE for the    presence of SARS CoV-2 RNA  __________________________________________________________  Test performed using the Roche cobas Liat SARS-CoV-2 assay. This assay is  only for use under the Food and Drug Administration's Emergency Use  Authorization. This is a real-time RT-PCR assay for the qualitative  detection of SARS-CoV-2 RNA. Viral nucleic acids may persist in vivo,  independent of viability. Detection of viral nucleic acid does not imply the  presence of infectious virus, or that virus nucleic acid is the cause of  clinical symptoms. Negative results do not preclude SARS-CoV-2 infection and  should not be used as the sole basis for diagnosis, treatment or other  patient management decisions. Negative results must be combined with  clinical observations, patient history, and/or epidemiological information.  Invalid results may be due to inhibiting substances in the specimen and  recollection should occur. Please see Fact Sheets for patients and providers  located:  https://www.b[employer]-chung.com/          Influenza A Not Detected     Influenza B  Not Detected     Comment: Test performed using the Roche cobas Liat SARS-CoV-2 & Influenza A/B assay.  This assay is only for use under the Food and Drug Administration's  Emergency Use Authorization. This is a multiplex real-time RT-PCR assay  intended for the simultaneous in vitro qualitative detection and  differentiation of SARS-CoV-2, influenza A, and influenza B virus RNA. Viral  nucleic acids may persist in vivo, independent of viability. Detection of  viral nucleic acid does not imply the presence of infectious virus, or that  virus nucleic acid is the cause of clinical symptoms. Negative  results do  not preclude SARS-CoV-2, influenza A, and/or influenza B infection and  should not be used as the sole basis for diagnosis, treatment or other  patient management decisions. Negative results must be combined with  clinical observations, patient history, and/or epidemiological information.  Invalid results may be due to inhibiting substances in the specimen and  recollection should occur. Please see Fact Sheets for patients and providers  located: http://olson-hall.info/.         Narrative:      o Collect and clearly label specimen type:  o PREFERRED-Upper respiratory specimen: One Nasal Swab in  Transport Media.  o Hand deliver to laboratory ASAP  Diagnostic -[facility]    Culture Blood Aerobic and Anaerobic [[id number]] Collected: 04/13/21 1529    Order Status: Completed Specimen: Arm from Blood, Venipuncture Updated: 04/17/21 2021    Narrative:      ORDER#: [id number]                                    ORDERED BY: MANOOCHEHRI, OM  SOURCE: Blood, Venipuncture arm                      COLLECTED:  04/13/21 15:29  ANTIBIOTICS AT COLL.:                                RECEIVED :  04/13/21 20:03  Culture Blood Aerobic and Anaerobic        PRELIM      04/17/21 20:21  04/14/21   No Growth after 1 day/s of incubation.  04/15/21   No Growth after 2 day/s of incubation.  04/16/21   No Growth after 3 day/s of incubation.  04/17/21   No Growth after 4 day/s of incubation.      Culture Blood Aerobic and Anaerobic [[id number]] Collected: 04/13/21 1529    Order Status: Completed Specimen: Arm from Blood, Venipuncture Updated: 04/17/21 2021    Narrative:      ORDER#: [id number]                                    ORDERED BY: MANOOCHEHRI, OM  SOURCE: Blood, Venipuncture arm                      COLLECTED:  04/13/21 15:29  ANTIBIOTICS AT COLL.:                                RECEIVED :  04/13/21 20:03  Culture Blood Aerobic and Anaerobic        PRELIM      04/17/21 20:21  04/14/21   No Growth after 1 day/s of  incubation.  04/15/21   No Growth after 2 day/s of incubation.  04/16/21   No Growth after 3 day/s of incubation.  04/17/21   No Growth after 4 day/s of incubation.                Signed by: Cindi Carbon, MD

## 2021-04-17 NOTE — Plan of Care (Addendum)
NURSING SHIFT NOTE     Patient: [name]  Day: 0      SHIFT EVENTS     Shift Narrative/Significant Events (PRN med administration, fall, RRT, etc.):     A&O x4, c/o pain r/t abd. Wound, prn pain meds given with effcetive results per patient.  Wound care provided.  POC discussed at bedside.       Safety and fall precautions remain in place. Purposeful rounding completed.          ASSESSMENT     Changes in assessment from patient's baseline this shift:    Neuro: No  CV: No  Pulm: No  Peripheral Vascular: No  HEENT: No  GI: No  BM during shift: No, Last BM: Last BM Date: 04/16/21  GU: No   Integ: No  MS: No    Pain: yes  Pain Interventions: yes  Medications Utilized:percocet    Mobility: PMP Activity: Step 3 - Bed Mobility of Distance Walked (ft) (Step 6,7): 0 Feet           Lines     Patient Lines/Drains/Airways Status       Active Lines, Drains and Airways       Name Placement date Placement time Site Days    Peripheral IV 04/14/21 22 G Right;Posterior Hand 04/14/21  1914  Hand  2    External Urinary Catheter 04/13/21  2045  --  3    Graft/Fistula Left --  --  -- --                         VITAL SIGNS     Vitals:    04/17/21 1535   BP: 127/83   Pulse: 60   Resp: 18   Temp: 97.5 F (36.4 C)   SpO2: 97%       Temp  Min: 97.5 F (36.4 C)  Max: 98.6 F (37 C)  Pulse  Min: 60  Max: 73  Resp  Min: 16  Max: 18  BP  Min: 124/65  Max: 160/83  SpO2  Min: 94 %  Max: 98 %      Intake/Output Summary (Last 24 hours) at 04/17/2021 1631  Last data filed at 04/17/2021 0713  Gross per 24 hour   Intake 1070 ml   Output --   Net 1070 ml                CARE PLAN        Problem: Moderate/High Fall Risk Score >5  Goal: Patient will remain free of falls  Outcome: Progressing  Flowsheets (Taken 04/17/2021 0800)  High (Greater than 13):   LOW-Fall Interventions Appropriate for Low Fall Risk   LOW-Anticoagulation education for injury risk   MOD-Remain with patient during toileting   HIGH-Initiate use of floor mats as appropriate    HIGH-Consider use of low bed     Problem: Pain interferes with ability to perform ADL  Goal: Pain at adequate level as identified by patient  Outcome: Progressing  Flowsheets (Taken 04/17/2021 1630)  Pain at adequate level as identified by patient:   Identify patient comfort function goal   Assess for risk of opioid induced respiratory depression, including snoring/sleep apnea. Alert healthcare team of risk factors identified.   Assess pain on admission, during daily assessment and/or before any "as needed" intervention(s)   Reassess pain within 30-60 minutes of any procedure/intervention, per Pain Assessment, Intervention, Reassessment (AIR) Cycle   Evaluate  if patient comfort function goal is met   Evaluate patient's satisfaction with pain management progress   Offer non-pharmacological pain management interventions     Problem: Infection  Goal: Free from infection  Outcome: Progressing  Flowsheets (Taken 04/17/2021 1630)  Free from infection:   Assess for signs/symptoms of infection   Utilize isolation precautions per protocol/policy   Consult/collaborate with Infection Preventionist

## 2021-04-17 NOTE — Progress Notes (Signed)
PROGRESS NOTE    Date Time: 04/17/21 9:47 AM  Patient Name: [name], [name]  Patient status: [place] [facility] Day: 0      Assessment:   Abdominal wall abscess at PD catheter site. 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  Metabolic acidosis   Diabetes mellitus  Hypertension  Hyperlipidemia  Diabetic neuropathy  COPD  Obstructive sleep apnea  Severe obesity BMI 44.7  Full code   P  an:     Wound cultures growing Staph aureus, sensitivity pending patient on vancomycin.  Continue local wound care.  Oxygenation normal on room air.  Dialysis per schedule.  Blood sugar elevated increase Lantus dose continue sliding-scale coverage.  BP stable with current regimen on amlodipine.  On aspirin, statin.  On gabapentin  Respiratory status stable, symptomatic treatment  Supportive care  DVT prophylaxis  Discharge planning likely tomorrow, waiting for culture sensitivity.  Labs and data reviewed.  Discussed with patient and nurse    Subjective:   Patient seen and examined :  No overnight events.  No new complaints.  Blood sugar on higher side, patient eats well  Review of Systems:   General ROS:   Afebrile no chills.  No chest pain palpitation  No shortness of breath, cough, wheezing.  Denies nausea vomiting  , complain of abdominal pain  No gross focal deficit  No urogenital symptoms    Medications:     Current Facility-Administered Medications   Medication Dose Route Frequency    amLODIPine  10 mg Oral Daily    aspirin  81 mg Oral Daily    atorvastatin  40 mg Oral Daily    carvedilol  25 mg Oral Q12H [facility]    dextromethorphan-guaiFENesin  1 tablet Oral Q12H    gabapentin  100 mg Oral Q8H [place]    insulin glargine  25 Units Subcutaneous QHS    insulin lispro  1-4 Units Subcutaneous QHS    insulin lispro  1-8 Units  Subcutaneous TID AC    insulin lispro  10 Units Subcutaneous Once    lactobacillus/streptococcus  1 capsule Oral Daily    lidocaine-EPINEPHrine  20 mL Other Once    pantoprazole  40 mg Oral QAM AC    piperacillin-tazobactam  2.25 g Intravenous Q12H [facility]    sevelamer  800 mg Oral TID MEALS    vancomycin   Intravenous See Admin Instructions       acetaminophen, albuterol sulfate HFA, dextrose **OR** dextrose **OR** dextrose **OR** glucagon (rDNA), diphenhydrAMINE, HYDROmorphone, melatonin, naloxone, ondansetron **OR** ondansetron, oxyCODONE-acetaminophen, petrolatum    Physical Exam:   BP 145/73   Pulse 69   Temp 97.7 F (36.5 C) (Oral)   Resp 16   Ht 1.575 m (5\' 2" )   Wt 110.9 kg (244 lb 7.8 oz)   LMP  (LMP Unknown) Comment: post menopausal  SpO2 98%   BMI 44.72 kg/m     Intake and Output Summary (Last 24 hours) at Date Time    Intake/Output Summary (Last 24 hours) at 04/17/2021 0947  Last data filed at 04/17/2021 0328  Gross per 24 hour   Intake 1140 ml   Output 2406 ml   Net -1266 ml         General: Awake alert oriented x3.  Neck: Supple no JVD  ENT: PERRLA,  EOMI  Lungs: Clear to auscultation no crackles no wheezing  Heart: Regular rate and rhythm no gallop  Abdomen: Soft nontender bowel sounds positive  Abdominal wound with serosanguineous drainage  Extremities: Warm moist  Neuro: Grossly intact  GU: No CVA tenderness.    Labs:     CBC w/Diff CMP   Recent Labs   Lab 04/16/21  0624 04/15/21  0310 04/14/21  0341 04/13/21  1526   WBC 4.99 5.10 4.05 5.06   Hgb 11.9 11.8 11.3* 12.2   Hematocrit 40.1 38.3 35.1 38.7   Platelets 93* 88* 82* 90*   MCV 96.2* 94.1 90.2 90.0   Neutrophils  --   --   --  72.7         PT/INR         Recent Labs   Lab 04/17/21  0407 04/16/21  0624 04/15/21  0310 04/14/21  0341 04/13/21  1526   Sodium  --  140 140 139 134*   Potassium  --  4.0 3.7 3.5 4.2   Chloride  --  105 104 104 99   CO2  --  28 27 25 19    BUN  --  26.0* 29.0* 42.0* 69.0*   Creatinine  --  4.1* 4.6* 7.3* 10.2*    Glucose  --  220* 240* 178* 523*   Calcium  --  7.4* 7.3* 7.5* 8.0*   Phosphorus 2.7  --   --   --  6.8*   Protein, Total  --   --   --   --  5.9*   Albumin  --   --   --   --  2.5*   AST (SGOT)  --   --   --   --  18   ALT  --   --   --   --  23   Alkaline Phosphatase  --   --   --   --  229*   Bilirubin, Total  --   --   --   --  0.6        Glucose POCT   Recent Labs   Lab 04/16/21  0624 04/15/21  0310 04/14/21  0341 04/13/21  1526   Glucose 220* 240* 178* 523*                Rads:   No results found.      Barbaraann Faster, MD  04/17/2021    *This note was generated by the Epic EMR system/ Dragon speech recognition and may contain inherent errors or omissions not intended by the user. Grammatical errors, random word insertions, deletions, pronoun errors and incomplete sentences are occasional consequences of this technology due to software limitations. Not all errors are caught or corrected. If there are questions or concerns about the content of this note or information contained within the body of this dictation they should be addressed directly with the author for clarification

## 2021-04-18 LAB — VANCOMYCIN, RANDOM: Vancomycin Random: 24.7 ug/mL

## 2021-04-18 LAB — GLUCOSE WHOLE BLOOD - POCT
Whole Blood Glucose POCT: 175 mg/dL — ABNORMAL HIGH (ref 70–100)
Whole Blood Glucose POCT: 216 mg/dL — ABNORMAL HIGH (ref 70–100)
Whole Blood Glucose POCT: 196 mg/dL — ABNORMAL HIGH (ref 70–100)
Whole Blood Glucose POCT: 300 mg/dL — ABNORMAL HIGH (ref 70–100)

## 2021-04-18 MED ORDER — VANCOMYCIN HCL IN NACL 750-0.9 MG/250ML-% IV SOLN
750.0000 mg | Freq: Once | INTRAVENOUS | Status: AC
Start: 2021-04-18 — End: 2021-04-18
  Administered 2021-04-18: 750 mg via INTRAVENOUS
  Filled 2021-04-18: qty 750

## 2021-04-18 MED ORDER — AQUAPHOR EX OINT
TOPICAL_OINTMENT | Freq: Two times a day (BID) | CUTANEOUS | Status: DC
Start: 2021-04-18 — End: 2021-04-19
  Filled 2021-04-18: qty 50

## 2021-04-18 MED ORDER — MUPIROCIN 2 % EX OINT
TOPICAL_OINTMENT | Freq: Every day | CUTANEOUS | Status: DC
Start: 2021-04-18 — End: 2021-04-19
  Filled 2021-04-18: qty 22

## 2021-04-18 NOTE — Plan of Care (Signed)
Problem: Renal Instability  Goal: Fluid and electrolyte balance are achieved/maintained  Outcome: Progressing  Flowsheets (Taken 04/18/2021 1049)  Fluid and electrolyte balance are achieved/maintained:   Monitor daily weight   Monitor/assess lab values and report abnormal values   Assess for confusion/personality changes     Problem: Patient Receiving Advanced Renal Therapies  Goal: Therapy access site remains intact  Outcome: Progressing  Flowsheets (Taken 04/18/2021 1049)  Therapy access site remains intact: Assess therapy access site

## 2021-04-18 NOTE — UM Notes (Signed)
[facility] Utilization Review  NPI: [id number], Tax ID: [id number]  Please Call [phone] with any questions or concerns. Confidential voicemail.  Fax final authorizations and requests for additional information Fax to [phone]    Date Time: 04/18/21 9:53 AM  Patient Name: [name], [name]  66 y.o., 10/25/1955    Legal sex: Female     CSR OBSERVATION 04/18/21  Hospital Day: 37     66 y.o. female with a subcutaneous abscess along prior PD catheter site.    Assessment:   Abdominal wall abscess at PD catheter site. 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  Metabolic acidosis   Diabetes mellitus  Hypertension  Hyperlipidemia  Diabetic neuropathy  COPD  Obstructive sleep apnea  Severe obesity BMI 44.7  Full code   Plan:      Wound culture MRSA ID following , Will cont Vanco At HD   Continue local wound care.  Oxygenation normal on room air.  Dialysis per schedule.  Blood sugar elevated increase Lantus dose continue sliding-scale coverage.  BP stable with current regimen on amlodipine.  On aspirin, statin.  On gabapentin  Respiratory status stable, symptomatic treatment  Supportive care  DVT prophylaxis  DCP discussed with Patient States No Body At home today     Wound Photography:         Plan/Follow-Up:      To abdominal wound:  1. Clean with normal saline, rinse out hole.  2. Apply antibiotic ointment (Mupirocin) to the periwound skin.  3. Pack lightly with a small piece of Maxorb, apply more maxorb outside if needed for drainage.  4. Cover with ABD pad and tape.     Skin Care to dry skin, arms, back, legs and feet:  Apply Aquaphor after AM/PM care. DO NOT apply between toes.

## 2021-04-18 NOTE — Progress Notes (Signed)
Arrived in room, pt is contact isolation.  PPE done per protocol. Pt is alert and oriented. Pt denied pain and sob. LAVF access is optimally working.  Timeouts and safety checks done. Will closely monitor the pt.        04/18/21 1000   Bedside Nurse Communication   Name of bedside RN - pre dialysis Neldon Newport, RN.   Treatment Initiation- With Dialysis Precautions   Time Out/Safety Check Completed Yes   Consent for HD signed for this hospitalization (Date) 02/04/21   Consent for HD signed for this hospitalization (Time) 1450   Blood Consent Verified N/A   Dialysis Precautions All Connections Secured;Saline Line Double Clamped;Venous Parameters Set;Arterial Parameters Set;Air Foam Detecctor Engaged   Dialysis Treatment Type Routine;Bedside   Special Considerations Contact isolation   Is patient diabetic? Yes   RO/Hemodialysis Architectural technologist   Is Total Chlorine less than 0.1 ppm? Yes   Orignial Total Chlorine Testing Time 0905   At 4 Hour Total Chlorine Testing Time 1305   RO/Hemodialysis Facilities manager Number 5    Machine Serial Number [id number]   RO # 21   RO Serial # [id number]   Water Hardness NA   pH 7.2   Pressure Test Verified Yes   Alarms Verified Passed   Machine Temperature 98.6 F (37 C)   Alarms Verified Yes   Na+ mEq (Machine) 138 mEq   Bicarb mEq (Machine) 35 mEq   Hemodialysis Conductivity (Machine) 13.8   Hemodialysis Conductivity (Meter) 13.8   Dialyzer Lot Number [id number]   Tubing Lot Number [id number]   RO Machine Log Completed Yes   Hepatitis Status   HBsAg (Antigen) Result Negative   HBsAg Date Drawn 04/13/21   HBsAg Repeat Draw Due Date 05/12/21   Dialysis Weight   Pre-Treatment Weight (Kg) 110.3   Scale Type ICU Bed Scale   Vitals   Temp 97.8 F (36.6 C)   Heart Rate 64   Resp Rate 18   BP 140/54   SpO2 100 %   O2 Device None (Room air)   Assessment   Mental Status Alert;Oriented   Respiratory Pattern Regular   Bilateral Breath Sounds Diminished   R Breath Sounds  Diminished   L Breath Sounds Diminished   RLE Edema Non Pitting Edema   LLE Edema Non Pitting Edema   General Skin Color Appropriate for ethnicity   Skin Condition/Temp Warm;Dry   Abdomen Inspection Soft   GI Symptoms None   Mobility Bed   Graft/Fistula Left   No placement date or time found.   Access Type: Arteriovenous Fistula  Orientation: Left  Graft/Fistula Location: Upper Arm   Fistula/ Graft Assessment Abnormalities WDL   Pain Assessment   Charting Type Assessment   Pain Scale Used Numeric Scale (0-10)   Numeric Pain Scale   Pain Score 0   POSS Score 1   Hemodialysis Comments   Pre-Hemodialysis Comments Timeout and safety checks done

## 2021-04-18 NOTE — Plan of Care (Addendum)
NURSING SHIFT NOTE     Patient: [name]  Day: 0      SHIFT EVENTS     Shift Narrative/Significant Events (PRN med administration, fall, RRT, etc.):   Pt  is A&O X4,  c/o pain, prn mgt. On room air and no Telemetry. Patient is minimal  assist   with ADL/hygiene.  Pt plan of care includes pain mgt, HD-MWF and IV antibiotics   Safety and fall precautions remain in place. Purposeful rounding completed.       Patient wound culture positive for MRSA         ASSESSMENT     Changes in assessment from patient's baseline this shift:    Neuro: No  CV: No  Pulm: No  Peripheral Vascular: No  HEENT: No  GI: No  BM during shift: No, Last BM: Last BM Date: 04/16/21  GU: No   Integ: No  MS: No    Pain: Improved  Pain Interventions: Medications and Rest  Medications Utilized:     Mobility: PMP Activity: Step 3 - Bed Mobility of Distance Walked (ft) (Step 6,7): 0 Feet           Lines     Patient Lines/Drains/Airways Status       Active Lines, Drains and Airways       Name Placement date Placement time Site Days    Peripheral IV 04/14/21 22 G Right;Posterior Hand 04/14/21  1914  Hand  3    External Urinary Catheter 04/13/21  2045  --  4    Graft/Fistula Left --  --  -- --                         VITAL SIGNS     Vitals:    04/17/21 2311   BP: 133/73   Pulse: 64   Resp: 21   Temp: 97.9 F (36.6 C)   SpO2: 95%       Temp  Min: 97.5 F (36.4 C)  Max: 98.6 F (37 C)  Pulse  Min: 60  Max: 69  Resp  Min: 16  Max: 22  BP  Min: 124/65  Max: 160/83  SpO2  Min: 95 %  Max: 98 %      Intake/Output Summary (Last 24 hours) at 04/18/2021 0141  Last data filed at 04/18/2021 0100  Gross per 24 hour   Intake 560 ml   Output --   Net 560 ml            CARE PLAN         Problem: Renal Instability  Goal: Fluid and electrolyte balance are achieved/maintained  Outcome: Progressing     Problem: Patient Receiving Advanced Renal Therapies  Goal: Therapy access site remains intact  Outcome: Progressing     Problem: Moderate/High Fall Risk Score  >5  Goal: Patient will remain free of falls  Outcome: Progressing     Problem: Compromised Tissue integrity  Goal: Damaged tissue is healing and protected  Outcome: Progressing  Goal: Nutritional status is improving  Outcome: Progressing     Problem: Safety  Goal: Patient will be free from injury during hospitalization  Outcome: Progressing  Goal: Patient will be free from infection during hospitalization  Outcome: Progressing     Problem: Compromised Hemodynamic Status  Goal: Vital signs and fluid balance maintained/improved  Outcome: Progressing     Problem: Inadequate Gas Exchange  Goal: Adequate oxygenation and improved ventilation  Outcome: Progressing

## 2021-04-18 NOTE — Plan of Care (Addendum)
NURSING SHIFT NOTE     Patient: [name]  Day: 0      SHIFT EVENTS     Shift Narrative/Significant Events (PRN med administration, fall, RRT, etc.):     Pt is alert and oriented times four. Pt is on room air. Scheduled medications were given. Dialysis done today, 2.5 litters were removed. Skin care provided. Wound consult done. Wound care done, IV antibiotics were given full report given to night nurse.    Safety and fall precautions remain in place. Purposeful rounding completed.          ASSESSMENT     Changes in assessment from patient's baseline this shift:    Neuro: No  CV: No  Pulm: No  Peripheral Vascular: No  HEENT: No  GI: No  BM during shift: No, Last BM: Last BM Date: 04/16/21  GU: No   Integ: No  MS: No    Pain: None  Pain Interventions: None  Medications Utilized:     Mobility: PMP Activity: Step 3 - Bed Mobility of Distance Walked (ft) (Step 6,7): 0 Feet           Lines     Patient Lines/Drains/Airways Status       Active Lines, Drains and Airways       Name Placement date Placement time Site Days    Peripheral IV 04/14/21 22 G Right;Posterior Hand 04/14/21  1914  Hand  3    External Urinary Catheter 04/13/21  2045  --  4    Graft/Fistula Left --  --  -- --                         VITAL SIGNS     Vitals:    04/18/21 1355   BP: 135/65   Pulse: (!) 56   Resp: 18   Temp: 97.5 F (36.4 C)   SpO2: 98%       Temp  Min: 97.5 F (36.4 C)  Max: 97.9 F (36.6 C)  Pulse  Min: 56  Max: 92  Resp  Min: 18  Max: 22  BP  Min: 83/52  Max: 179/58  SpO2  Min: 95 %  Max: 100 %      Intake/Output Summary (Last 24 hours) at 04/18/2021 1444  Last data filed at 04/18/2021 1355  Gross per 24 hour   Intake 410 ml   Output 2500 ml   Net -2090 ml          CARE PLAN        Problem: Renal Instability  Goal: Fluid and electrolyte balance are achieved/maintained  Flowsheets (Taken 04/18/2021 0749)  Fluid and electrolyte balance are achieved/maintained:   Monitor intake and output every shift   Monitor daily weight    Monitor/assess lab values and report abnormal values   Assess for confusion/personality changes   Assess and reassess fluid and electrolyte status   Provide adequate hydration     Problem: Pain interferes with ability to perform ADL  Goal: Pain at adequate level as identified by patient  Flowsheets (Taken 04/18/2021 0749)  Pain at adequate level as identified by patient:   Identify patient comfort function goal   Assess for risk of opioid induced respiratory depression, including snoring/sleep apnea. Alert healthcare team of risk factors identified.   Assess pain on admission, during daily assessment and/or before any "as needed" intervention(s)     Problem: Safety  Goal: Patient will be free from  injury during hospitalization  Flowsheets (Taken 04/18/2021 [phone])  Patient will be free from injury during hospitalization:   Assess patient's risk for falls and implement fall prevention plan of care per policy   Use appropriate transfer methods   Ensure appropriate safety devices are available at the bedside   Provide and maintain safe environment   Hourly rounding   Include patient/ family/ care giver in decisions related to safety     Problem: Compromised Hemodynamic Status  Goal: Vital signs and fluid balance maintained/improved  Flowsheets (Taken 04/18/2021 0749)  Vital signs and fluid balance are maintained/improved:   Position patient for maximum circulation/cardiac output   Monitor and compare daily weight   Monitor intake and output. Notify LIP if urine output is less than 30 mL/hour.   Monitor/assess vitals and hemodynamic parameters with position changes     Problem: Inadequate Gas Exchange  Goal: Adequate oxygenation and improved ventilation  Flowsheets (Taken 04/18/2021 0749)  Adequate oxygenation and improved ventilation:   Assess lung sounds   Monitor SpO2 and treat as needed   Monitor and treat ETCO2   Provide mechanical and oxygen support to facilitate gas exchange

## 2021-04-18 NOTE — Progress Notes (Signed)
Pt is contact isolation, PPE done per protocol.  Pt alert and oriented. Pt denied pain and sob. No other complain given. LAVF access is optimally working post dialysis. No bleeding on access site post tx. Unable to tolerate prescribed UF d/t soft BP during tx.  UF lowered and NSS given to support BP.    Hemodialysis ended and pt. able to finish 3.5 hrs tx.  Able to removed Net Fluid of 2.5L.  Reports were given to Primary nurse.        04/18/21 1355   Treatment Summary   Time Off Machine 1335   Duration of Treatment (Hours) 3.5   Treatment Type 1:1   Dialyzer Clearance Moderately streaked   Fluid Volume Off (mL) 3000   Prime Volume (mL) 200   Rinseback Volume (mL) 200   Fluid Given: Normal Saline (mL) 100   Fluid Given: PRBC  0 mL   Fluid Given: Albumin (mL) 0   Fluid Given: Other (mL) 0   Total Fluid Given 500   Hemodialysis Net Fluid Removed 2500   Post Treatment Assessment   Post-Treatment Weight (Kg)   (-2.5L.)   Patient Response to Treatment fairly tolerated   Additional Dialyzer Used 0   Graft/Fistula Left   No placement date or time found.   Access Type: Arteriovenous Fistula  Orientation: Left  Graft/Fistula Location: Upper Arm   Fistula/ Graft Assessment Abnormalities WDL   Needle Size 16 Gauge   Cannulation Sites held Arterial (min) 8   Cannulation Sites held Venous (min) 8   Hemostasis Achieved Yes   Vitals   Temp 97.5 F (36.4 C)   Heart Rate (!) 56   Resp Rate 18   BP 135/65   SpO2 98 %   O2 Device None (Room air)   Assessment   Mental Status Alert;Oriented   Respiratory Pattern Regular   Bilateral Breath Sounds Diminished   R Breath Sounds Diminished   L Breath Sounds Diminished   RLE Edema Non Pitting Edema   LLE Edema Non Pitting Edema   General Skin Color Appropriate for ethnicity   Skin Condition/Temp Warm;Dry   Abdomen Inspection Soft   GI Symptoms None   Mobility Bed   Pain Assessment   Charting Type Reassessment   Pain Scale Used Numeric Scale (0-10)   Numeric Pain Scale   Pain Score 0    POSS Score 1   Education   Person taught Patient   Knowledge basis Substantial   Topics taught Procedure   Teaching Tools Explain   Reponse Verbalizes Understanding   Bedside Nurse Communication   Name of bedside RN - post dialysis Neldon Newport, RN.

## 2021-04-18 NOTE — Consults (Signed)
Wound Ostomy Continence Consultation    Date Time: 04/18/21 9:53 AM  Patient Name: [name], [name]  Consulting Service: [facility] Day: 6     Reason for Consult   Abdominal wound     Assessment & Plan   Assessment:   Pt assessed in her room resting in bed. She is awake, alert and oriented.  She is able to move all extremitiies.     She previously was able to do peritoneal dialysis, but she eventually developed an infection and the port was removed by May 2021. The wound had healed, but she said it very recently "opened up". 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 It is about 1x2x1cm with a moist pale pink wound bed. It is draining clear fluid. Moderate tenderness when palpated around the wound and with light packing.    Not assessed, during previous visit, she had dry scaly feet and toes.  She is currently complaining of a rash on her arms.    Wound Photography:         Plan/Follow-Up:     To abdominal wound:  1. Clean with normal saline, rinse out hole.  2. Apply antibiotic ointment (Mupirocin) to the periwound skin.  3. Pack lightly with a small piece of Maxorb, apply more maxorb outside if needed for drainage.  4. Cover with ABD pad and tape.    Skin Care to dry skin, arms, back, legs and feet:  Apply Aquaphor after AM/PM care. DO NOT apply between toes.    Aquaphor Healing Ointment is uniquely formulated to restore smooth, healthy skin. This multi-purpose ointment protects and soothes extremely dry skin, chapped lips, cracked hands and feet, minor cuts and burns, and many other skin irritations.    Initiate/ continue pressure prevention bundle:  Head of the bed 30 degrees or less  Positioning device to the bedside  Eliminate/minimize pressure from the area  Float heels with boots or pillows  Turn patient  Pressure redistribution cushion to the chair  Use lift sheets/low friction surface sheets for positioning  Pad bony prominences  Nutrition  consult/Optimize nutrition  Initiate bed algorithm/Specialty bed  Moisture/Incontinence management - Cleanse with incontinence cleansing wipe/water to manage incontinence and to protect skin from exposure to urine and stool. Apply skin barrier protection cream. Apply [place]/external or female external urine management system to prevent urinary contamination of a wound. Apply rectal pouch/fecal management system per unit policy, to prevent fecal contamination of a wound or to contain diarrhea.      Objective Findings   Braden: Braden Scale Score: 17 (04/18/21 0920)  Braden Subscales:  Sensory Perceptions: Slightly limited (04/18/21 0920)  Moisture: Occasionally moist (04/18/21 0920)  Activity: Chairfast (04/18/21 0920)  Mobility: Slightly limited (04/18/21 0920)  Nutrition: Adequate (04/18/21 0920)  Friction and Shear: No apparent problem (04/18/21 0920)    Ht Readings from Last 1 Encounters:   04/13/21 1.575 m (5\' 2" )     Wt Readings from Last 3 Encounters:   04/18/21 110.6 kg (243 lb 13.4 oz)   03/05/21 107.4 kg (236 lb 12.4 oz)   02/08/21 98.6 kg (217 lb 4.8 oz)     Body mass index is 44.6 kg/m.    Current Diet:   Supervise For Meals Frequency: All meals  Diet heart healthy Fluid restriction: 1000 ML FLUID     Mobility: ambualtory    Continence: Continent, hemodialysis depenant    Current Interventions: Pillows for repositioning  Mattress: Versa care mattress    Patient Education: Discussed with the patient and all questioned fully answered.     History of Present Illness   This is a 66 y.o. female  has a past medical history of Chronic obstructive pulmonary disease, Diabetes mellitus, Hyperlipidemia, Hypertension, and Sleep apnea (2018)..  Admitted with Volume overload.      From H&P by Dr. Barbaraann Faster:  [name] is a 66 y.o. female   with history of end-stage renal disease used to be on peritoneal dialysis which was stopped about 2 years ago after patient had been noticed with infections at PD  catheter site.  Patient is on hemodialysis she presented to emergency room with bleeding and drainage from the abdominal wall noted early morning.  She has been missing dialysis for 9 days stated that she was getting chickenpox which is healing although patient does not have any skin lesions.  She was complaining of shortness of breath patient is on home oxygen 2 L via nasal cannula.  She denies fever chills chest pain complain of dyspnea no cough no nausea vomiting abdominal pain.  She denies focal neurological symptoms.  Work-up in emergency room showed elevated phosphorus 6.8 blood sugar was elevated 452, labs showed blood sugar 523 BUN 69 creatinine 10.2 sodium 134 alkaline phosphatase 229.  She had an anion gap 16.  CBC shows normal white cell count H&H is 12.2/38.7, platelet count 90  CT scan of abdomen is consistent with 3.5 and 3.2 into 3.2 cm fluid collection with surrounding fat stranding on abdominal wall at the site of previous peritoneal dialysis, extensive diffuse anasarca.         Hanley Hays, BSN, RN, [employer], [place]  Inpatient Wound, Ostomy, and Continence Nurse Coordinator  [facility]  [phone]

## 2021-04-18 NOTE — Progress Notes (Addendum)
[facility] PROGRESS NOTE  [id number]      Date Time: 04/18/21 6:31 PM  Patient Name: [name]  Attending Physician: Barbaraann Faster, MD    CC: follow-up ESRD    Assessment:     ESRD on HD MWF at [facility] - followed by Dr. 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 HD today with UF 2.5L , next HD Wednesday   2. IV abx, monitor vanc levels   3. Surgery following     Case discussed with: HD RN, pt      Gaylyn Lambert, MD  [facility]  [id number] (office)      Subjective:     Continues to have pain and drainage at abscess site.  BP soft, UF reduced to 2.5L    Review of Systems:   No sob    Physical Exam:     Vitals:    04/18/21 1315 04/18/21 1330 04/18/21 1335 04/18/21 1355   BP: 130/61 119/48 (!) 126/30 135/65   Pulse: 65 (!) 58 60 (!) 56   Resp: 18 18 18 18    Temp:    97.5 F (36.4 C)   TempSrc:       SpO2: 98% 98% 98% 98%   Weight:       Height:           Intake and Output Summary (Last 24 hours) at Date Time    Intake/Output Summary (Last 24 hours) at 04/18/2021 1831  Last data filed at 04/18/2021 1544  Gross per 24 hour   Intake 810 ml   Output 2500 ml   Net -1690 ml         General:  NAD  Cardiovascular: regular rate and rhythm  Lungs: clear to auscultation bilaterally, bilateral air entry, normal work of breathing  Abdomen: soft  Extremities: chronic LE edema    Meds:      Scheduled Meds: PRN Meds:    amLODIPine, 10 mg, Oral, Daily  aspirin, 81 mg, Oral,  Daily  atorvastatin, 40 mg, Oral, Daily  carvedilol, 25 mg, Oral, Q12H [facility]  dextromethorphan-guaiFENesin, 1 tablet, Oral, Q12H  gabapentin, 100 mg, Oral, Q8H [facility]  insulin glargine, 28 Units, Subcutaneous, QHS  insulin lispro, 1-4 Units, Subcutaneous, QHS  insulin lispro, 1-8 Units, Subcutaneous, TID AC  insulin lispro, 10 Units, Subcutaneous, Once  lactobacillus/streptococcus, 1 capsule, Oral, Daily  lidocaine-EPINEPHrine, 20 mL, Other, Once  mupirocin, , Topical, Daily at 1200  pantoprazole, 40 mg, Oral, QAM AC  petrolatum, , Topical, Q12H  piperacillin-tazobactam, 2.25 g, Intravenous, Q12H SCH  sevelamer, 800 mg, Oral, TID MEALS  vancomycin, , Intravenous, See Admin Instructions          Continuous Infusions:   acetaminophen, 500 mg, Q6H PRN  albuterol sulfate HFA, 2 puff, Q6H PRN  dextrose, 15 g of glucose, PRN   Or  dextrose, 12.5 g, PRN   Or  dextrose, 12.5 g, PRN  Or  glucagon (rDNA), 1 mg, PRN  diphenhydrAMINE, 6.25 mg, Q6H PRN  HYDROmorphone, 0.4 mg, Once PRN  melatonin, 3 mg, QHS PRN  naloxone, 0.2 mg, PRN  ondansetron, 4 mg, Q8H PRN   Or  ondansetron, 4 mg, Q8H PRN  oxyCODONE-acetaminophen, 1 tablet, Q4H PRN  petrolatum, , PRN              Labs:     Recent Labs   Lab 04/16/21  0624 04/15/21  0310 04/14/21  0341   WBC 4.99 5.10 4.05   Hgb 11.9 11.8 11.3*   Hematocrit 40.1 38.3 35.1   Platelets 93* 88* 82*       Recent Labs   Lab 04/17/21  0407 04/16/21  0624 04/15/21  0310 04/14/21  0341 04/13/21  1526   Sodium  --  140 140 139 134*   Potassium  --  4.0 3.7 3.5 4.2   Chloride  --  105 104 104 99   CO2  --  28 27 25 19    BUN  --  26.0* 29.0* 42.0* 69.0*   Creatinine  --  4.1* 4.6* 7.3* 10.2*   Calcium  --  7.4* 7.3* 7.5* 8.0*   Albumin  --   --   --   --  2.5*   Phosphorus 2.7  --   --   --  6.8*   Glucose  --  220* 240* 178* 523*   EGFR  --  13.2 11.5 6.8 4.6         Recent Labs   Lab 04/14/21  0755   Urine Type Urine, Clean Ca   Color, UA Yellow   Clarity, UA Hazy   Specific Gravity UA 1.026   Urine pH  6.0   Nitrite, UA Negative   Ketones UA Trace*   Urobilinogen, UA Negative   Bilirubin, UA Negative   Blood, UA Small*   RBC, UA 3 - 5   WBC, UA 0 - 5             Signed by: Gaylyn Lambert, MD

## 2021-04-18 NOTE — Progress Notes (Signed)
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 Last labs on 3/24.   Platelets are above threshold of intervention  Continue iron repletion with dialysis and ESA therapy  Continue on antibiotics per ID  Continue Local Wound Care  Continue home meds for comorbid conditions    Subjective:     Patient Continues to have pain and drainage at abscess site  Denies sob or cp   No fever  No bleeding    Medications:      Scheduled Meds: PRN Meds:    amLODIPine, 10 mg, Oral, Daily  aspirin, 81 mg, Oral, Daily  atorvastatin, 40 mg, Oral, Daily  carvedilol, 25 mg, Oral, Q12H [facility]  dextromethorphan-guaiFENesin, 1 tablet, Oral, Q12H  gabapentin, 100 mg, Oral, Q8H [facility]  insulin glargine, 28 Units, Subcutaneous, QHS  insulin lispro, 1-4 Units, Subcutaneous, QHS  insulin lispro, 1-8 Units, Subcutaneous, TID AC  insulin lispro, 10 Units, Subcutaneous, Once  lactobacillus/streptococcus, 1 capsule, Oral, Daily  lidocaine-EPINEPHrine, 20 mL, Other, Once  pantoprazole, 40 mg, Oral, QAM  AC  piperacillin-tazobactam, 2.25 g, Intravenous, Q12H SCH  sevelamer, 800 mg, Oral, TID MEALS  vancomycin, , Intravenous, See Admin Instructions         acetaminophen, 500 mg, Q6H PRN  albuterol sulfate HFA, 2 puff, Q6H PRN  dextrose, 15 g of glucose, PRN   Or  dextrose, 12.5 g, PRN   Or  dextrose, 12.5 g, PRN   Or  glucagon (rDNA), 1 mg, PRN  diphenhydrAMINE, 6.25 mg, Q6H PRN  HYDROmorphone, 0.4 mg, Once PRN  melatonin, 3 mg, QHS PRN  naloxone, 0.2 mg, PRN  ondansetron, 4 mg, Q8H PRN   Or  ondansetron, 4 mg, Q8H PRN  oxyCODONE-acetaminophen, 1 tablet, Q4H PRN  petrolatum, , PRN          I personally reviewed all of the medications    Review of Systems:   Per narrative, 2 system review was done.    Physical Exam:   Visit Vitals  BP 170/83   Pulse 61   Temp 97.7 F (36.5 C) (Oral)   Resp 19   Ht 1.575 m (5\' 2" )   Wt 110.6 kg (243 lb 13.4 oz)   LMP  (LMP Unknown)   SpO2 97%   BMI 44.60 kg/m         Intake/Output Summary (Last 24 hours) at 04/18/2021 0831  Last data filed at 04/18/2021 0829  Gross per 24 hour   Intake 410 ml   Output --  Net 410 ml            Physical Exam:      General: Obesity  Skin: no rashes or lesions.  Eye: PERRL  HENT: no scleral icterus, no lymphadenopathy.  Lungs: non-labored respiration.  Heart: normal rate, regular rhythm, no murmur.  Abdomen: soft, non-tender. PD site noted with erythema.  Musculoskeletal: no tenderness and swelling  Neurologic: awake, alert, and oriented X3, non-focal exam.          Labs:     Recent Labs   Lab 04/17/21  0407 04/16/21  0624 04/15/21  0310 04/14/21  0341 04/13/21  1526   Glucose  --  220* 240* 178* 523*   BUN  --  26.0* 29.0* 42.0* 69.0*   Creatinine  --  4.1* 4.6* 7.3* 10.2*   Calcium  --  7.4* 7.3* 7.5* 8.0*   Sodium  --  140 140 139 134*   Potassium  --  4.0 3.7 3.5 4.2   Chloride  --  105 104 104 99   CO2  --  28 27 25 19    Albumin  --   --   --   --  2.5*   Phosphorus 2.7  --   --   --  6.8*   AST (SGOT)  --   --   --   --  18   ALT  --   --   --   --   23   Bilirubin, Total  --   --   --   --  0.6   Alkaline Phosphatase  --   --   --   --  229*       Recent Labs   Lab 04/16/21  0624 04/15/21  0310 04/14/21  0341   WBC 4.99 5.10 4.05   Hgb 11.9 11.8 11.3*   Hematocrit 40.1 38.3 35.1   MCV 96.2* 94.1 90.2   MCH 28.5 29.0 29.0   MCHC 29.7* 30.8* 32.2   Platelets 93* 88* 82*         No results for input(s): PTT, PT, INR in the last 72 hours.      Microbiology:  Microbiology Results (last 15 days)       Procedure Component Value Units Date/Time    Culture + Gram Lenise Arena Wound [[id number]] Collected: 04/14/21 1609    Order Status: Completed Specimen: Wound Updated: 04/17/21 2031    Narrative:      [id number] called Micro Results of MRSA. Read back [id number], by [id number] on  04/17/2021 at 20:30  ESwab  ORDER#: [id number]                                    ORDERED BY: CHOUDHARY, IMTI  SOURCE: Wound Abdominal wall wound                   COLLECTED:  04/14/21 16:09  ANTIBIOTICS AT COLL.:                                RECEIVED :  04/14/21 22:37  ORDER ENTRY COMMENTS:  ESwab  [id number] called Micro Results of MRSA. Read back [id number], by [id number] on 04/17/2021 at 20:30  Stain, Gram  FINAL       04/15/21 00:03  04/15/21   No Squamous epithelial cells seen             No WBCs or organisms seen  Culture and Gram Stain, Aerobic, Wound     FINAL       04/17/21 20:31   +  04/15/21   Heavy growth of Staphylococcus aureus               Refer to susceptibilities on culture #[id number]        Culture + Gram Lenise Arena, Wound [[id number]] Collected: 04/14/21 1201    Order Status: Completed Specimen: Wound Updated: 04/17/21 1648    Narrative:      Oren Binet Culturette  ORDER#: [id number]                                    ORDERED BY: Gustavus Messing, IMTI  SOURCE: Wound abdomen                                COLLECTED:  04/14/21 12:01  ANTIBIOTICS AT COLL.:                                RECEIVED :  04/14/21 15:22  ORDER ENTRY COMMENTS:  Blue Culturette  Stain, Gram                                 FINAL       04/14/21 16:55   +  04/14/21   No Squamous epithelial cells seen             Many WBCs             Moderate Gram positive cocci  Culture and Gram Stain, Aerobic, Wound     FINAL       04/17/21 16:48   +  04/17/21   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      _____________________________________________________________________________                                      MRSA        ANTIBIOTICS                     MIC  INTRP      _____________________________________________________________________________  Clindamycin                    <=0.5   S        Daptomycin                      <=1    S        Erythromycin                    >4     R        Gentamicin                      <=2    S        Levofloxacin  4     I        Linezolid                        2     S        Oxacillin                       >2     R  D1    Penicillin                      >1     R        Rifampin                       <=0.5   S        Tetracycline                   <=0.5   S        Trimethoprim/Sulfamethoxazole <=0.5/9  S        Vancomycin                       1     S          -----DRUG COMMENTS----------    D1:  Oxacillin sensitivity predicts sensitivity to cephalosporins.         [place] [employer] 2016  _____________________________________________________________________________            S=SUSCEPTIBLE     I=INTERMEDIATE     R=RESISTANT       N/S=NON-SUSCEPTIBLE     SDD=SUSCEPTIBLE-DOSE DEPENDENT  _____________________________________________________________________________      COVID-19 (SARS-CoV-2) and Influenza A/B, NAA (Liat Rapid)- Admission [[id number]] Collected: 04/13/21 1529    Order Status: Completed Specimen: Culturette from Nasopharyngeal Updated: 04/13/21 1615     Purpose of COVID testing Diagnostic -[facility]I     SARS-CoV-2 Specimen Source Nasal Swab     SARS CoV 2 Overall Result Not Detected     Comment:  __________________________________________________  -A result of "Detected" indicates POSITIVE for the    presence of SARS CoV-2 RNA  -A result of "Not Detected" indicates NEGATIVE for the    presence of SARS CoV-2 RNA  __________________________________________________________  Test performed using the Roche cobas Liat SARS-CoV-2 assay. This assay is  only for use under the Food and Drug Administration's Emergency Use  Authorization. This is a real-time RT-PCR assay for the qualitative  detection of SARS-CoV-2 RNA. Viral nucleic acids may persist in vivo,  independent of viability. Detection of viral nucleic acid does not imply the  presence of infectious virus, or that virus nucleic acid is the cause of  clinical symptoms. Negative results do not preclude SARS-CoV-2 infection and  should not be used as the sole basis for diagnosis, treatment or other  patient management decisions. Negative results must be combined with  clinical observations, patient history, and/or epidemiological information.  Invalid results may be due to inhibiting substances in the specimen and  recollection should occur. Please see Fact Sheets for patients and providers  located:  https://www.benson-chung.com/          Influenza A Not Detected     Influenza B Not Detected     Comment: Test performed using the Roche cobas Liat SARS-CoV-2 & Influenza A/B assay.  This assay is only for use under the Food and Drug Administration's  Emergency Use Authorization. This is a multiplex real-time RT-PCR assay  intended for the simultaneous in vitro qualitative detection and  differentiation of SARS-CoV-2, influenza A, and influenza B virus RNA. Viral  nucleic acids may persist in vivo, independent of viability. Detection of  viral nucleic acid does not imply the presence of infectious virus, or that  virus nucleic acid is the cause of clinical symptoms. Negative results do  not preclude SARS-CoV-2, influenza A, and/or influenza B  infection and  should not be used as the sole basis for diagnosis, treatment or other  patient management decisions. Negative results must be combined with  clinical observations, patient history, and/or epidemiological information.  Invalid results may be due to inhibiting substances in the specimen and  recollection should occur. Please see Fact Sheets for patients and providers  located: http://olson-hall.info/.         Narrative:      o Collect and clearly label specimen type:  o PREFERRED-Upper respiratory specimen: One Nasal Swab in  Transport Media.  o Hand deliver to laboratory ASAP  Diagnostic -[facility]I    Culture Blood Aerobic and Anaerobic [[id number]] Collected: 04/13/21 1529    Order Status: Completed Specimen: Arm from Blood, Venipuncture Updated: 04/17/21 2021    Narrative:      ORDER#: [id number]                                    ORDERED BY: MANOOCHEHRI, OM  SOURCE: Blood, Venipuncture arm                      COLLECTED:  04/13/21 15:29  ANTIBIOTICS AT COLL.:                                RECEIVED :  04/13/21 20:03  Culture Blood Aerobic and Anaerobic        PRELIM      04/17/21 20:21  04/14/21   No Growth after 1 day/s of incubation.  04/15/21   No Growth after 2 day/s of incubation.  04/16/21   No Growth after 3 day/s of incubation.  04/17/21   No Growth after 4 day/s of incubation.      Culture Blood Aerobic and Anaerobic [[id number]] Collected: 04/13/21 1529    Order Status: Completed Specimen: Arm from Blood, Venipuncture Updated: 04/17/21 2021    Narrative:      ORDER#: [id number]                                    ORDERED BY: MANOOCHEHRI, OM  SOURCE: Blood, Venipuncture arm                      COLLECTED:  04/13/21 15:29  ANTIBIOTICS AT COLL.:                                RECEIVED :  04/13/21 20:03  Culture Blood Aerobic and Anaerobic        PRELIM      04/17/21 20:21  04/14/21   No Growth after 1 day/s of incubation.  04/15/21   No Growth after 2 day/s of incubation.  04/16/21   No  Growth after 3 day/s of incubation.  04/17/21   No Growth after 4 day/s of incubation.                Signed by: Diane Carbon, MD

## 2021-04-18 NOTE — Progress Notes (Signed)
PROGRESS NOTE    Date Time: 04/18/21 7:48 AM  Patient Name: [name], [name]  Patient status: [place] [facility] Day: 0      Assessment:   Abdominal wall abscess at PD catheter site. 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  Metabolic acidosis   Diabetes mellitus  Hypertension  Hyperlipidemia  Diabetic neuropathy  COPD  Obstructive sleep apnea  Severe obesity BMI 44.7  Full code   P  an:     Wound culture MRSA ID following , Will cont Vanco At HD   Continue local wound care.  Oxygenation normal on room air.  Dialysis per schedule.  Blood sugar elevated increase Lantus dose continue sliding-scale coverage.  BP stable with current regimen on amlodipine.  On aspirin, statin.  On gabapentin  Respiratory status stable, symptomatic treatment  Supportive care  DVT prophylaxis  DCP discussed with Patient States No Body At home today   Labs and data reviewed.  Discussed with patient and nurse  Discussed with Dr.Choudhary   Subjective:   Patient seen and examined :  No overnight events.  Overall doing well.  No new complaints  Complain of pain at incisional site  No new complaints review of Systems:   General ROS:   Afebrile.  No chest pain palpitation dizziness  No shortness of breath, cough, wheezing.  Denies nausea vomiting abdominal pain.  No gross focal deficit  No urogenital symptoms.    Medications:     Current Facility-Administered Medications   Medication Dose Route Frequency    amLODIPine  10 mg Oral Daily    aspirin  81 mg Oral Daily    atorvastatin  40 mg Oral Daily    carvedilol  25 mg Oral Q12H [facility]    dextromethorphan-guaiFENesin  1 tablet Oral Q12H    gabapentin  100 mg Oral Q8H [facility]    insulin glargine  28 Units Subcutaneous QHS    insulin lispro  1-4 Units Subcutaneous QHS    insulin lispro  1-8 Units Subcutaneous  TID AC    insulin lispro  10 Units Subcutaneous Once    lactobacillus/streptococcus  1 capsule Oral Daily    lidocaine-EPINEPHrine  20 mL Other Once    pantoprazole  40 mg Oral QAM AC    piperacillin-tazobactam  2.25 g Intravenous Q12H [facility]    sevelamer  800 mg Oral TID MEALS    vancomycin   Intravenous See Admin Instructions       acetaminophen, albuterol sulfate HFA, dextrose **OR** dextrose **OR** dextrose **OR** glucagon (rDNA), diphenhydrAMINE, HYDROmorphone, melatonin, naloxone, ondansetron **OR** ondansetron, oxyCODONE-acetaminophen, petrolatum    Physical Exam:   BP 170/83   Pulse 61   Temp 97.7 F (36.5 C) (Oral)   Resp 19   Ht 1.575 m (5\' 2" )   Wt 110.6 kg (243 lb 13.4 oz)   LMP  (LMP Unknown) Comment: post menopausal  SpO2 97%   BMI 44.60 kg/m     Intake and Output Summary (Last 24 hours) at Date Time    Intake/Output Summary (Last 24 hours) at 04/18/2021 0748  Last data filed at 04/18/2021 0100  Gross per 24 hour   Intake 110 ml   Output --   Net 110 ml         General: Awake alert oriented x3.  Neck: Supple no JVD  ENT: PERRLA, EOMI  Lungs: Clear to auscultation no crackles no wheezing  Heart: Regular rate and rhythm no gallop  Abdomen: Soft nontender bowel sounds positive  Incisional tenderness minimum serosanguineous drainage  Extremities: Warm moist  Neuro: Grossly intact  GU: No CVA tenderness.    Labs:     CBC w/Diff CMP   Recent Labs   Lab 04/16/21  0624 04/15/21  0310 04/14/21  0341 04/13/21  1526   WBC 4.99 5.10 4.05 5.06   Hgb 11.9 11.8 11.3* 12.2   Hematocrit 40.1 38.3 35.1 38.7   Platelets 93* 88* 82* 90*   MCV 96.2* 94.1 90.2 90.0   Neutrophils  --   --   --  72.7         PT/INR         Recent Labs   Lab 04/17/21  0407 04/16/21  0624 04/15/21  0310 04/14/21  0341 04/13/21  1526   Sodium  --  140 140 139 134*   Potassium  --  4.0 3.7 3.5 4.2   Chloride  --  105 104 104 99   CO2  --  28 27 25 19    BUN  --  26.0* 29.0* 42.0* 69.0*   Creatinine  --  4.1* 4.6* 7.3* 10.2*   Glucose  --   220* 240* 178* 523*   Calcium  --  7.4* 7.3* 7.5* 8.0*   Phosphorus 2.7  --   --   --  6.8*   Protein, Total  --   --   --   --  5.9*   Albumin  --   --   --   --  2.5*   AST (SGOT)  --   --   --   --  18   ALT  --   --   --   --  23   Alkaline Phosphatase  --   --   --   --  229*   Bilirubin, Total  --   --   --   --  0.6        Glucose POCT   Recent Labs   Lab 04/16/21  0624 04/15/21  0310 04/14/21  0341 04/13/21  1526   Glucose 220* 240* 178* 523*                Rads:   No results found.      Barbaraann Faster, MD  04/18/2021    *This note was generated by the Epic EMR system/ Dragon speech recognition and may contain inherent errors or omissions not intended by the user. Grammatical errors, random word insertions, deletions, pronoun errors and incomplete sentences are occasional consequences of this technology due to software limitations. Not all errors are caught or corrected. If there are questions or concerns about the content of this note or information contained within the body of this dictation they should be addressed directly with the author for clarification

## 2021-04-18 NOTE — Progress Notes (Addendum)
Infectious Diseases & Tropical Medicine  Progress Note    04/18/2021   [name] [id number] is a 66 y.o. female,       Assessment:     Abdominal wall abscess/wound at the site of previous peritoneal dialysis catheter (removed 2 years ago)  S/p incision and drainage by general surgery (04/15/2021)  Wound cultures-MRSA  End-stage renal disease on hemodialysis  Diabetes mellitus  COPD  Sleep apnea  No fever or leukocytosis  Clinically stable    Plan:     Continue vancomycin for hemodialysis until 05/27/2021  Pharmacy monitoring vancomycin levels  Discontinue Zosyn  Continue local wound care  Continue probiotics  Monitor clinically      Discussed with patient in detail  Discussed with nursing staff  Discussed with Dr.Tahira    ROS:     General:  no fever, no chills, sleepy but arousable, receiving hemodialysis  HEENT: no neck pain, no throat pain  Endocrine:  no fatigue, no night sweats  Respiratory: no cough, shortness of breath, or wheezing   Cardiovascular: no chest pain   Gastrointestinal: Abdominal wall draining wound, complains of diarrhea  Genito-Urinary: no hematuria  Musculoskeletal: no edema  Neurological: c/o generalized weakness   Dermatological: no rash, no ulcer    Physical Examination:     Blood pressure 170/83, pulse 61, temperature 97.7 F (36.5 C), temperature source Oral, resp. rate 19, height 1.575 m (5\' 2" ), weight 110.6 kg (243 lb 13.4 oz), SpO2 97 %.    General Appearance: Sleepy  HEENT: Pupils are equal, round, and reactive to light.   Lungs: Clear to auscultation  Heart:  Regular rate and rhythm  Chest: Symmetric chest wall expansion.   Abdomen: soft ,non tender,no hepatosplenomegaly,good bowel sounds, abdominal wall wound with dressing in place  Neurological: No focal deficit  Extremities: No edema    Laboratory And Diagnostic Studies:     Recent Labs     04/16/21  0624   WBC 4.99   Hgb 11.9   Hematocrit 40.1   Platelets 93*       Recent Labs     04/16/21  0624   Sodium  140   Potassium 4.0   Chloride 105   CO2 28   BUN 26.0*   Creatinine 4.1*   Glucose 220*   Calcium 7.4*       No results for input(s): AST, ALT, ALKPHOS, PROT, ALB in the last 72 hours.      Current Meds:      Scheduled Meds: PRN Meds:    amLODIPine, 10 mg, Oral, Daily  aspirin, 81 mg, Oral, Daily  atorvastatin, 40 mg, Oral, Daily  carvedilol, 25 mg, Oral, Q12H [facility]  dextromethorphan-guaiFENesin, 1 tablet, Oral, Q12H  gabapentin, 100 mg, Oral, Q8H [facility]  insulin glargine, 28 Units, Subcutaneous, QHS  insulin lispro, 1-4 Units, Subcutaneous, QHS  insulin lispro, 1-8 Units, Subcutaneous, TID AC  insulin lispro, 10 Units, Subcutaneous, Once  lactobacillus/streptococcus, 1 capsule, Oral, Daily  lidocaine-EPINEPHrine, 20 mL, Other, Once  pantoprazole, 40 mg, Oral, QAM AC  piperacillin-tazobactam, 2.25 g, Intravenous, Q12H [facility]  sevelamer, 800 mg, Oral, TID MEALS  vancomycin, , Intravenous, See Admin Instructions        Continuous Infusions:   acetaminophen, 500 mg, Q6H PRN  albuterol sulfate HFA, 2 puff, Q6H PRN  dextrose, 15 g of glucose, PRN   Or  dextrose, 12.5 g, PRN   Or  dextrose, 12.5 g, PRN   Or  glucagon (rDNA), 1 mg, PRN  diphenhydrAMINE, 6.25 mg, Q6H PRN  HYDROmorphone, 0.4 mg, Once PRN  melatonin, 3 mg, QHS PRN  naloxone, 0.2 mg, PRN  ondansetron, 4 mg, Q8H PRN   Or  ondansetron, 4 mg, Q8H PRN  oxyCODONE-acetaminophen, 1 tablet, Q4H PRN  petrolatum, , PRN          Deanna Boehlke A. Gustavus Messing, M.D.  04/18/2021  9:37 AM

## 2021-04-19 LAB — GLUCOSE WHOLE BLOOD - POCT
Whole Blood Glucose POCT: 335 mg/dL — ABNORMAL HIGH (ref 70–100)
Whole Blood Glucose POCT: 289 mg/dL — ABNORMAL HIGH (ref 70–100)
Whole Blood Glucose POCT: 384 mg/dL — ABNORMAL HIGH (ref 70–100)

## 2021-04-19 LAB — VANCOMYCIN, RANDOM: Vancomycin Random: 28.1 ug/mL

## 2021-04-19 MED ORDER — ATORVASTATIN CALCIUM 40 MG PO TABS
40.0000 mg | ORAL_TABLET | Freq: Every day | ORAL | 0 refills | Status: DC
Start: 2021-04-19 — End: 2021-06-22

## 2021-04-19 MED ORDER — OXYCODONE-ACETAMINOPHEN 5-325 MG PO TABS
1.0000 | ORAL_TABLET | ORAL | 0 refills | Status: DC | PRN
Start: 2021-04-19 — End: 2021-05-02

## 2021-04-19 MED ORDER — CARVEDILOL 25 MG PO TABS
25.0000 mg | ORAL_TABLET | Freq: Two times a day (BID) | ORAL | 0 refills | Status: DC
Start: 2021-04-19 — End: 2021-06-22

## 2021-04-19 MED ORDER — RISAQUAD PO CAPS
1.0000 | ORAL_CAPSULE | Freq: Every day | ORAL | 0 refills | Status: DC
Start: 2021-04-20 — End: 2021-06-22

## 2021-04-19 MED ORDER — MUPIROCIN 2 % EX OINT
TOPICAL_OINTMENT | Freq: Every day | CUTANEOUS | 0 refills | Status: DC
Start: 2021-04-19 — End: 2021-06-22

## 2021-04-19 NOTE — Progress Notes (Signed)
ROC is confirmed for pt at [facility], 11am on MWF schedule.      Freida Busman, HD Care Coordinator  [phone]

## 2021-04-19 NOTE — Plan of Care (Signed)
NURSING SHIFT NOTE     Patient: [name]  Day: 0      SHIFT EVENTS     Shift Narrative/Significant Events (PRN med administration, fall, RRT, etc.):     Patient is 66 yo, A/O x4. Pt is on RA, no tele. Pt has c/o of abdominal pain. PRN percocet given. Wound care done. IV abx given. Pt was able to go the restroom with walker/ 1x assist. Scheduled meds given. Call bell within reach. Safety and fall precautions remain in place. Purposeful rounding completed. Will continue plan of care.         ASSESSMENT     Changes in assessment from patient's baseline this shift:    Neuro: No  CV: No  Pulm: No  Peripheral Vascular: No  HEENT: No  GI: No  BM during shift: No, Last BM: Last BM Date: 04/18/21  GU: No   Integ: No  MS: No    Pain: Improved  Pain Interventions: Medications  Medications Utilized: Percocet    Mobility: PMP Activity: Step 6 - Walks in Room of [employer] (ft) (Step 6,7): 30 Feet           Lines     Patient Lines/Drains/Airways Status       Active Lines, Drains and Airways       Name Placement date Placement time Site Days    Peripheral IV 04/14/21 22 G Right;Posterior Hand 04/14/21  1914  Hand  4    External Urinary Catheter 04/13/21  2045  --  5    Graft/Fistula Left --  --  -- --                         VITAL SIGNS     Vitals:    04/19/21 0347   BP: 126/59   Pulse: 60   Resp: 18   Temp: 97.3 F (36.3 C)   SpO2: 97%       Temp  Min: 97.3 F (36.3 C)  Max: 98.2 F (36.8 C)  Pulse  Min: 56  Max: 92  Resp  Min: 18  Max: 21  BP  Min: 83/52  Max: 179/58  SpO2  Min: 93 %  Max: 100 %      Intake/Output Summary (Last 24 hours) at 04/19/2021 0400  Last data filed at 04/18/2021 2047  Gross per 24 hour   Intake 1000 ml   Output 2500 ml   Net -1500 ml            CARE PLAN     Problem: Renal Instability  Goal: Fluid and electrolyte balance are achieved/maintained  Outcome: Progressing  Flowsheets (Taken 04/19/2021 0359)  Fluid and electrolyte balance are achieved/maintained:   Monitor intake and output every  shift   Monitor/assess lab values and report abnormal values   Provide adequate hydration   Assess for confusion/personality changes     Problem: Patient Receiving Advanced Renal Therapies  Goal: Therapy access site remains intact  Outcome: Progressing  Flowsheets (Taken 04/19/2021 0359)  Therapy access site remains intact: Assess therapy access site     Problem: Moderate/High Fall Risk Score >5  Goal: Patient will remain free of falls  Outcome: Progressing  Flowsheets (Taken 04/19/2021 0359)  High (Greater than 13):   HIGH-Consider use of low bed   HIGH-Visual cue at entrance to patient's room   HIGH-Initiate use of floor mats as appropriate   HIGH-Apply yellow "Fall Risk" arm band  HIGH-Bed alarm on at all times while patient in bed     Problem: Compromised Tissue integrity  Goal: Damaged tissue is healing and protected  Outcome: Progressing  Flowsheets (Taken 04/19/2021 0359)  Damaged tissue is healing and protected:   Monitor/assess Braden scale every shift   Relieve pressure to bony prominences for patients at moderate and high risk   Provide wound care per wound care algorithm   Increase activity as tolerated/progressive mobility   Avoid shearing injuries   Reposition patient every 2 hours and as needed unless able to reposition self   Keep intact skin clean and dry   Use bath wipes, not soap and water, for daily bathing     Problem: Pain interferes with ability to perform ADL  Goal: Pain at adequate level as identified by patient  Outcome: Progressing  Flowsheets (Taken 04/19/2021 0359)  Pain at adequate level as identified by patient:   Identify patient comfort function goal   Assess for risk of opioid induced respiratory depression, including snoring/sleep apnea. Alert healthcare team of risk factors identified.   Reassess pain within 30-60 minutes of any procedure/intervention, per Pain Assessment, Intervention, Reassessment (AIR) Cycle   Assess pain on admission, during daily assessment and/or before any "as  needed" intervention(s)   Evaluate if patient comfort function goal is met   Evaluate patient's satisfaction with pain management progress     Problem: Safety  Goal: Patient will be free from injury during hospitalization  Outcome: Progressing  Flowsheets (Taken 04/19/2021 0359)  Patient will be free from injury during hospitalization:   Assess patient's risk for falls and implement fall prevention plan of care per policy   Provide and maintain safe environment   Use appropriate transfer methods   Ensure appropriate safety devices are available at the bedside   Include patient/ family/ care giver in decisions related to safety

## 2021-04-19 NOTE — Plan of Care (Addendum)
NURSING SHIFT NOTE     Patient: [name]  Day: 0      SHIFT EVENTS     Shift Narrative/Significant Events (PRN med administration, fall, RRT, etc.):     Pt is alert and oriented times four. Scheduled medications were given. Wound care provided. Incontinent care provided.Pt discharged to home, wound instructions and supplies were given to pt. Medications and instructions also reviewed with pt. IV line removed. Home heath PT/OT, Nurse set for home, and dialysis in out patient basis also set up by case manager. Pt called relative for pick up transport.    Safety and fall precautions remain in place. Purposeful rounding completed.          ASSESSMENT     Changes in assessment from patient's baseline this shift:    Neuro: No  CV: No  Pulm: No  Peripheral Vascular: No  HEENT: No  GI: No  BM during shift: No, Last BM: Last BM Date: 04/18/21  GU: No   Integ: No  MS: No    Pain: None  Pain Interventions: None  Medications Utilized:     Mobility: PMP Activity: Step 6 - Walks in Room of Distance Walked (ft) (Step 6,7): 30 Feet           Lines     Patient Lines/Drains/Airways Status       Active Lines, Drains and Airways       Name Placement date Placement time Site Days    Peripheral IV 04/14/21 22 G Right;Posterior Hand 04/14/21  1914  Hand  4    External Urinary Catheter 04/13/21  2045  --  5    Graft/Fistula Left --  --  -- --                         VITAL SIGNS     Vitals:    04/19/21 1113   BP: 143/80   Pulse: 66   Resp: 18   Temp: 97.7 F (36.5 C)   SpO2: 97%       Temp  Min: 97.3 F (36.3 C)  Max: 98.2 F (36.8 C)  Pulse  Min: 56  Max: 92  Resp  Min: 17  Max: 18  BP  Min: 83/52  Max: 161/82  SpO2  Min: 93 %  Max: 98 %      Intake/Output Summary (Last 24 hours) at 04/19/2021 1223  Last data filed at 04/18/2021 2047  Gross per 24 hour   Intake 700 ml   Output 2500 ml   Net -1800 ml            CARE PLAN        Problem: Patient Receiving Advanced Renal Therapies  Goal: Therapy access site remains  intact  Flowsheets (Taken 04/19/2021 0959)  Therapy access site remains intact:   Change therapy access site dressing as needed   Assess therapy access site     Problem: Moderate/High Fall Risk Score >5  Goal: Patient will remain free of falls  Flowsheets (Taken 04/19/2021 0959)  High (Greater than 13):   HIGH-Consider use of low bed   HIGH-Initiate use of floor mats as appropriate   HIGH-Bed alarm on at all times while patient in bed     Problem: Compromised Tissue integrity  Goal: Damaged tissue is healing and protected  Flowsheets (Taken 04/19/2021 0959)  Damaged tissue is healing and protected:   Monitor/assess Braden scale every shift  Reposition patient every 2 hours and as needed unless able to reposition self   Provide wound care per wound care algorithm   Increase activity as tolerated/progressive mobility   Relieve pressure to bony prominences for patients at moderate and high risk     Problem: Compromised Hemodynamic Status  Goal: Vital signs and fluid balance maintained/improved  Flowsheets (Taken 04/19/2021 0959)  Vital signs and fluid balance are maintained/improved:   Monitor and compare daily weight   Monitor/assess vitals and hemodynamic parameters with position changes   Position patient for maximum circulation/cardiac output   Monitor/assess lab values and report abnormal values     Problem: Inadequate Gas Exchange  Goal: Adequate oxygenation and improved ventilation  Flowsheets (Taken 04/19/2021 0959)  Adequate oxygenation and improved ventilation:   Assess lung sounds   Provide mechanical and oxygen support to facilitate gas exchange   Monitor SpO2 and treat as needed

## 2021-04-19 NOTE — Progress Notes (Signed)
[facility] PROGRESS NOTE  [id number]      Date Time: 04/19/21 9:15 AM  Patient Name: [name]  Attending Physician: Barbaraann Faster, MD    CC: follow-up ESRD    Assessment:     ESRD on HD MWF at [facility] - followed by Dr. 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 HD today with UF 2.5L , next HD Wednesday   2. IV abx, monitor vanc levels   3. Surgery following     Case discussed with: HD RN, pt      Eilene Ghazi, MD  [facility]  [id number] (office)      Subjective:     Continues to have pain and drainage at abscess site.  BP soft, UF reduced to 2.5L    Review of Systems:   No sob    Physical Exam:     Vitals:    04/18/21 2314 04/19/21 0347 04/19/21 0635 04/19/21 0912   BP: 127/71 126/59 161/82 161/82   Pulse: 62 60 61 61   Resp: 18 18 17     Temp: 97.7 F (36.5 C) 97.3 F (36.3 C) 97.7 F (36.5 C)    TempSrc: Oral Oral Oral    SpO2: 93% 97% 97%    Weight:       Height:           Intake and Output Summary (Last 24 hours) at Date Time    Intake/Output Summary (Last 24 hours) at 04/19/2021 0915  Last data filed at 04/18/2021 2047  Gross per 24 hour   Intake 700 ml   Output 2500 ml   Net -1800 ml         General:  NAD  Cardiovascular: regular rate and rhythm  Lungs: clear to auscultation bilaterally, bilateral air entry, normal work of breathing  Abdomen: soft  Extremities: chronic LE edema    Meds:      Scheduled Meds: PRN Meds:    amLODIPine, 10 mg, Oral, Daily  aspirin,  81 mg, Oral, Daily  atorvastatin, 40 mg, Oral, Daily  carvedilol, 25 mg, Oral, Q12H [facility]  dextromethorphan-guaiFENesin, 1 tablet, Oral, Q12H  gabapentin, 100 mg, Oral, Q8H [facility]  insulin glargine, 28 Units, Subcutaneous, QHS  insulin lispro, 1-4 Units, Subcutaneous, QHS  insulin lispro, 1-8 Units, Subcutaneous, TID AC  insulin lispro, 10 Units, Subcutaneous, Once  lactobacillus/streptococcus, 1 capsule, Oral, Daily  lidocaine-EPINEPHrine, 20 mL, Other, Once  mupirocin, , Topical, Daily at 1200  pantoprazole, 40 mg, Oral, QAM AC  petrolatum, , Topical, Q12H  sevelamer, 800 mg, Oral, TID MEALS  vancomycin, , Intravenous, See Admin Instructions          Continuous Infusions:   acetaminophen, 500 mg, Q6H PRN  albuterol sulfate HFA, 2 puff, Q6H PRN  dextrose, 15 g of glucose, PRN   Or  dextrose, 12.5 g, PRN   Or  dextrose, 12.5 g, PRN   Or  glucagon (  rDNA), 1 mg, PRN  diphenhydrAMINE, 6.25 mg, Q6H PRN  HYDROmorphone, 0.4 mg, Once PRN  melatonin, 3 mg, QHS PRN  naloxone, 0.2 mg, PRN  ondansetron, 4 mg, Q8H PRN   Or  ondansetron, 4 mg, Q8H PRN  oxyCODONE-acetaminophen, 1 tablet, Q4H PRN  petrolatum, , PRN              Labs:     Recent Labs   Lab 04/16/21  0624 04/15/21  0310 04/14/21  0341   WBC 4.99 5.10 4.05   Hgb 11.9 11.8 11.3*   Hematocrit 40.1 38.3 35.1   Platelets 93* 88* 82*       Recent Labs   Lab 04/17/21  0407 04/16/21  0624 04/15/21  0310 04/14/21  0341 04/13/21  1526   Sodium  --  140 140 139 134*   Potassium  --  4.0 3.7 3.5 4.2   Chloride  --  105 104 104 99   CO2  --  28 27 25 19    BUN  --  26.0* 29.0* 42.0* 69.0*   Creatinine  --  4.1* 4.6* 7.3* 10.2*   Calcium  --  7.4* 7.3* 7.5* 8.0*   Albumin  --   --   --   --  2.5*   Phosphorus 2.7  --   --   --  6.8*   Glucose  --  220* 240* 178* 523*   EGFR  --  13.2 11.5 6.8 4.6         Recent Labs   Lab 04/14/21  0755   Urine Type Urine, Clean Ca   Color, UA Yellow   Clarity, UA Hazy   Specific Gravity UA 1.026   Urine pH 6.0   Nitrite, UA Negative   Ketones UA  Trace*   Urobilinogen, UA Negative   Bilirubin, UA Negative   Blood, UA Small*   RBC, UA 3 - 5   WBC, UA 0 - 5             Signed by: Eilene Ghazi, MD

## 2021-04-19 NOTE — Discharge Instr - Diet (Signed)
Diet as tolerated

## 2021-04-19 NOTE — Progress Notes (Signed)
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 Last labs on 3/24.   Platelets are above threshold of intervention  Continue iron repletion with dialysis and ESA therapy  Continue vancomycin for hemodialysis until 05/27/2021 per ID and Neph  Continue Local Wound Care  Continue home meds for comorbid conditions    Likely discharge today   To follow in clinic for thrombocytopenia monitoring     Subjective:     C/o pain at the abdominal wall wound  Diarrhea better today  Denies sob or cp   No nausea vomiting    Medications:      Scheduled Meds: PRN Meds:    amLODIPine, 10 mg, Oral, Daily  aspirin, 81 mg, Oral, Daily  atorvastatin, 40 mg, Oral, Daily  carvedilol, 25 mg, Oral, Q12H [facility]  dextromethorphan-guaiFENesin, 1 tablet, Oral, Q12H  gabapentin, 100 mg, Oral, Q8H [facility]  insulin glargine, 28 Units, Subcutaneous, QHS  insulin lispro, 1-4 Units, Subcutaneous, QHS  insulin lispro, 1-8 Units, Subcutaneous, TID AC  insulin lispro, 10 Units, Subcutaneous, Once  lactobacillus/streptococcus, 1 capsule, Oral, Daily  lidocaine-EPINEPHrine, 20 mL,  Other, Once  mupirocin, , Topical, Daily at 1200  pantoprazole, 40 mg, Oral, QAM AC  petrolatum, , Topical, Q12H  piperacillin-tazobactam, 2.25 g, Intravenous, Q12H [facility]  sevelamer, 800 mg, Oral, TID MEALS  vancomycin, , Intravenous, See Admin Instructions         acetaminophen, 500 mg, Q6H PRN  albuterol sulfate HFA, 2 puff, Q6H PRN  dextrose, 15 g of glucose, PRN   Or  dextrose, 12.5 g, PRN   Or  dextrose, 12.5 g, PRN   Or  glucagon (rDNA), 1 mg, PRN  diphenhydrAMINE, 6.25 mg, Q6H PRN  HYDROmorphone, 0.4 mg, Once PRN  melatonin, 3 mg, QHS PRN  naloxone, 0.2 mg, PRN  ondansetron, 4 mg, Q8H PRN   Or  ondansetron, 4 mg, Q8H PRN  oxyCODONE-acetaminophen, 1 tablet, Q4H PRN  petrolatum, , PRN          I personally reviewed all of the medications    Review of Systems:   Per narrative, 2 system review was done.    Physical Exam:   Visit Vitals  BP 161/82   Pulse 61   Temp 97.7 F (36.5 C) (Oral)   Resp 17   Ht 1.575 m (5\' 2" )   Wt 110.6 kg (243 lb 13.4 oz)   LMP  (LMP Unknown)   SpO2 97%   BMI 44.60 kg/m         Intake/Output Summary (Last  24 hours) at 04/19/2021 0756  Last data filed at 04/18/2021 2047  Gross per 24 hour   Intake 1000 ml   Output 2500 ml   Net -1500 ml            Physical Exam:      General: Obesity  Skin: no rashes or lesions.  Eye: PERRL  HENT: no scleral icterus, no lymphadenopathy.  Lungs: non-labored respiration.  Heart: normal rate, regular rhythm, no murmur.  Abdomen: soft, non-tender. PD site noted with erythema.  Musculoskeletal: no tenderness and swelling  Neurologic: awake, alert, and oriented X3, non-focal exam.          Labs:     Recent Labs   Lab 04/17/21  0407 04/16/21  0624 04/15/21  0310 04/14/21  0341 04/13/21  1526   Glucose  --  220* 240* 178* 523*   BUN  --  26.0* 29.0* 42.0* 69.0*   Creatinine  --  4.1* 4.6* 7.3* 10.2*   Calcium  --  7.4* 7.3* 7.5* 8.0*   Sodium  --  140 140 139 134*   Potassium  --  4.0 3.7 3.5 4.2   Chloride  --  105 104 104 99   CO2  --  28 27 25 19    Albumin  --    --   --   --  2.5*   Phosphorus 2.7  --   --   --  6.8*   AST (SGOT)  --   --   --   --  18   ALT  --   --   --   --  23   Bilirubin, Total  --   --   --   --  0.6   Alkaline Phosphatase  --   --   --   --  229*       Recent Labs   Lab 04/16/21  0624 04/15/21  0310 04/14/21  0341   WBC 4.99 5.10 4.05   Hgb 11.9 11.8 11.3*   Hematocrit 40.1 38.3 35.1   MCV 96.2* 94.1 90.2   MCH 28.5 29.0 29.0   MCHC 29.7* 30.8* 32.2   Platelets 93* 88* 82*         No results for input(s): PTT, PT, INR in the last 72 hours.      Microbiology:  Microbiology Results (last 15 days)       Procedure Component Value Units Date/Time    Culture + Gram Lenise Arena Wound [[id number]] Collected: 04/14/21 1609    Order Status: Completed Specimen: Wound Updated: 04/17/21 2031    Narrative:      [id number] called Micro Results of MRSA. Read back [id number], by [id number] on  04/17/2021 at 20:30  ESwab  ORDER#: [id number]                                    ORDERED BY: CHOUDHARY, IMTI  SOURCE: Wound Abdominal wall wound                   COLLECTED:  04/14/21 16:09  ANTIBIOTICS AT COLL.:                                RECEIVED :  04/14/21 22:37  ORDER ENTRY COMMENTS:  ESwab  [id number] called Micro Results of MRSA. Read back [id number], by [phone] on  04/17/2021 at 20:30  Stain, Gram                                FINAL       04/15/21 00:03  04/15/21   No Squamous epithelial cells seen             No WBCs or organisms seen  Culture and Gram Stain, Aerobic, Wound     FINAL       04/17/21 20:31   +  04/15/21   Heavy growth of Staphylococcus aureus               Refer to susceptibilities on culture #[id number]        Culture + Gram Lenise Arena, Wound [[id number]] Collected: 04/14/21 1201    Order Status: Completed Specimen: Wound Updated: 04/17/21 1648    Narrative:      Oren Binet Culturette  ORDER#: [id number]                                    ORDERED BY: Gustavus Messing, IMTI  SOURCE: Wound abdomen                                COLLECTED:  04/14/21 12:01  ANTIBIOTICS AT COLL.:                                 RECEIVED :  04/14/21 15:22  ORDER ENTRY COMMENTS:  Blue Culturette  Stain, Gram                                FINAL       04/14/21 16:55   +  04/14/21   No Squamous epithelial cells seen             Many WBCs             Moderate Gram positive cocci  Culture and Gram Stain, Aerobic, Wound     FINAL       04/17/21 16:48   +  04/17/21   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      _____________________________________________________________________________                                      MRSA        ANTIBIOTICS                     MIC  INTRP      _____________________________________________________________________________  Clindamycin                    <=0.5   S        Daptomycin                      <=1    S        Erythromycin                    >4     R        Gentamicin                      <=  2    S        Levofloxacin                     4     I        Linezolid                        2     S        Oxacillin                       >2     R  D1    Penicillin                      >1     R        Rifampin                       <=0.5   S        Tetracycline                   <=0.5   S        Trimethoprim/Sulfamethoxazole <=0.5/9  S        Vancomycin                       1     S          -----DRUG COMMENTS----------    D1:  Oxacillin sensitivity predicts sensitivity to cephalosporins.         [place] [employer] [id number]  _____________________________________________________________________________            S=SUSCEPTIBLE     I=INTERMEDIATE     R=RESISTANT       N/S=NON-SUSCEPTIBLE     SDD=SUSCEPTIBLE-DOSE DEPENDENT  _____________________________________________________________________________      COVID-19 (SARS-CoV-2) and Influenza A/B, NAA (Liat Rapid)- Admission [[id number]] Collected: 04/13/21 1529    Order Status: Completed Specimen: Culturette from Nasopharyngeal Updated: 04/13/21 1615     Purpose of COVID testing Diagnostic -[facility]     SARS-CoV-2 Specimen  Source Nasal Swab     SARS CoV 2 Overall Result Not Detected     Comment: __________________________________________________  -A result of "Detected" indicates POSITIVE for the    presence of SARS CoV-2 RNA  -A result of "Not Detected" indicates NEGATIVE for the    presence of SARS CoV-2 RNA  __________________________________________________________  Test performed using the Roche cobas Liat SARS-CoV-2 assay. This assay is  only for use under the Food and Drug Administration's Emergency Use  Authorization. This is a real-time RT-PCR assay for the qualitative  detection of SARS-CoV-2 RNA. Viral nucleic acids may persist in vivo,  independent of viability. Detection of viral nucleic acid does not imply the  presence of infectious virus, or that virus nucleic acid is the cause of  clinical symptoms. Negative results do not preclude SARS-CoV-2 infection and  should not be used as the sole basis for diagnosis, treatment or other  patient management decisions. Negative results must be combined with  clinical observations, patient history, and/or epidemiological information.  Invalid results may be due to inhibiting substances in the specimen and  recollection should occur. Please see Fact Sheets for patients and providers  located:  https://www.[employer].com/          Influenza A Not Detected     Influenza B  Not Detected     Comment: Test performed using the Roche cobas Liat SARS-CoV-2 & Influenza A/B assay.  This assay is only for use under the Food and Drug Administration's  Emergency Use Authorization. This is a multiplex real-time RT-PCR assay  intended for the simultaneous in vitro qualitative detection and  differentiation of SARS-CoV-2, influenza A, and influenza B virus RNA. Viral  nucleic acids may persist in vivo, independent of viability. Detection of  viral nucleic acid does not imply the presence of infectious virus, or that  virus nucleic acid is the cause of clinical symptoms. Negative  results do  not preclude SARS-CoV-2, influenza A, and/or influenza B infection and  should not be used as the sole basis for diagnosis, treatment or other  patient management decisions. Negative results must be combined with  clinical observations, patient history, and/or epidemiological information.  Invalid results may be due to inhibiting substances in the specimen and  recollection should occur. Please see Fact Sheets for patients and providers  located: http://[facility]-[employer].info/.         Narrative:      o Collect and clearly label specimen type:  o PREFERRED-Upper respiratory specimen: One Nasal Swab in  Transport Media.  o Hand deliver to laboratory ASAP  Diagnostic -[facility]    Culture Blood Aerobic and Anaerobic [[id number]] Collected: 04/13/21 1529    Order Status: Completed Specimen: Arm from Blood, Venipuncture Updated: 04/18/21 2221    Narrative:      ORDER#: [id number]                                    ORDERED BY: MANOOCHEHRI, OM  SOURCE: Blood, Venipuncture arm                      COLLECTED:  04/13/21 15:29  ANTIBIOTICS AT COLL.:                                RECEIVED :  04/13/21 20:03  Culture Blood Aerobic and Anaerobic        FINAL       04/18/21 22:21  04/18/21   No growth after 5 days of incubation.      Culture Blood Aerobic and Anaerobic [[id number]] Collected: 04/13/21 1529    Order Status: Completed Specimen: Arm from Blood, Venipuncture Updated: 04/18/21 2221    Narrative:      ORDER#: [id number]                                    ORDERED BY: MANOOCHEHRI, OM  SOURCE: Blood, Venipuncture arm                      COLLECTED:  04/13/21 15:29  ANTIBIOTICS AT COLL.:                                RECEIVED :  04/13/21 20:03  Culture Blood Aerobic and Anaerobic        FINAL       04/18/21 22:21  04/18/21   No growth after 5 days of incubation.                Signed by: Cindi Carbon, MD

## 2021-04-19 NOTE — Discharge Summary (Signed)
DISCHARGE SUMMARY    Date Time: 04/19/21 9:35 AM  Patient Name: [name]  Attending Physician: Barbaraann Faster, MD    Date of Admission:   04/13/2021    Date of Discharge:   04/19/2021    Reason for Admission:   Volume overload [E87.70]    Discharge Dx:   Present on Admission:   Volume overload  Abdominal wall abscess at PD catheter site. 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  Metabolic acidosis   Diabetes mellitus  Hypertension  Hyperlipidemia  Diabetic neuropathy  COPD  Obstructive sleep apnea  Severe obesity BMI 44.    Consultations:   Treatment Team:   Attending Provider: Barbaraann Faster, MD  Consulting Physician: Corky Mull, MD  Consulting Physician: Heywood Footman, MD  Consulting Physician: Cindi Carbon, MD  Consulting Physician: Eilene Ghazi, MD      Procedures performed:       Discharge Medications:        Discharge Medication List        Taking      acetaminophen 500 MG tablet  Dose: 500 mg  Commonly known as: TYLENOL  Take 500 mg by mouth every 6 (six) hours as needed for Pain     albuterol sulfate HFA 108 (90 Base) MCG/ACT inhaler  Dose: 2 puff  Commonly known as: PROVENTIL  Inhale 2 puffs into the lungs every 6 (six) hours as needed for Wheezing or Shortness of Breath     amLODIPine 10 MG tablet  Dose: 1 tablet  Commonly known as: NORVASC  Take 1 tablet by mouth daily     aspirin 81 MG chewable tablet  Dose: 81 mg  Chew 81 mg by mouth daily     atorvastatin 40 MG tablet  Dose: 40 mg  Commonly known as: LIPITOR  Take 1 tablet (40 mg) by mouth daily     carvedilol 25 MG tablet  Dose: 25 mg  Commonly known as: COREG  Take 1 tablet (25 mg) by mouth every 12 (twelve) hours     dextromethorphan-guaiFENesin 30-600 MG per 12 hr tablet  Dose: 1 tablet  Commonly known as: MUCINEX DM  Take 1 tablet  by mouth every 12 (twelve) hours     gabapentin 100 MG capsule  Dose: 100 mg  Commonly known as: NEURONTIN  Take 1 capsule (100 mg) by mouth every 8 (eight) hours     insulin aspart 100 UNIT/ML injection  Dose: 15 Units  Commonly known as: NovoLOG  Inject 15 Units into the skin 3 (three) times daily before meals     lactobacillus/streptococcus Caps  Dose: 1 capsule  Start taking on: April 20, 2021  Take 1 capsule by mouth daily     mupirocin 2 % ointment  Commonly known as: BACTROBAN  Apply topically daily USe BID for 2 Weeks     oxyCODONE-acetaminophen 5-325 MG per tablet  Dose: 1 tablet  Commonly known as: PERCOCET  Take 1 tablet by mouth every 4 (four) hours as needed for Pain     pantoprazole 40 MG tablet  Dose: 40 mg  Commonly known as: PROTONIX  Take 40 mg by mouth daily     petrolatum ointment  Apply topically every 12 (twelve) hours     sevelamer 800 MG tablet  Dose: 800  mg  Commonly known as: RENVELA  Take 800 mg by mouth 2 (two) times daily with meals     TRESIBA SC  Dose: 35 Unit  Inject 35 Unit into the skin            Vancomycin hemodialysis until May 27, 2021-discussed with nephrology Dr. Sherwood Gambler Course:   Details of admission and consultations on record.  [name] is a 66 y.o. female   with history of end-stage renal disease used to be on peritoneal dialysis which was stopped about 2 years ago after patient had been noticed with infections at PD catheter site.  Patient is on hemodialysis she presented to emergency room with bleeding and drainage from the abdominal wall on old scar tissue on the anterior abdominal wall where she had PD catheter.  Symptoms started on the morning of admission.  She was at admitted to hospital with abdominal wall abscess at PD catheter site.  Hospital course:  Patient was started on broad-spectrum antibiotic after cultures were obtained.  Surgical consultation was requested for incision and drainage patient had IND at the abscess site.  Drainage culture  was positive for MRSA.  Patient is on vancomycin, Zosyn was discontinued.  Patient is consulted by infectious diseases, recommended continuation of antibiotic until 05/27/2021.  Plan is discussed with nephrology-Dr. Posey Pronto.  Patient is dialysis patient she has been getting her dialysis per schedule upon admission she had missed dialysis for 1 week as she generated that she had shingles and was unable to go to the dialysis center.  In hospital patient has been getting dialysis per schedule.  She will continue her dialysis after discharge per schedule.  She was found with fluid overloading she has chronic respiratory failure was on home oxygen due to COPD, on admission she required oxygen which was weaned off to 0 she is saturating normal on room air.  Patient had thrombocytopenia likely ITP, platelet count improved progressively patient was seen by hematology while in hospital.  She did not have episodes of bleeding.  She was found with metabolic acidosis due to missing dialysis upon admission, improved.  She is a diabetic hypertensive hyperlipidemic has been receiving her medication.  Currently patient is stable she will be discharged home with recommendations to continue dialysis per schedule and continue vancomycin until May 27, 2021.  She Will be discharged with [facility] course per daily Notes .      Laboratory Data     CBC  Recent Labs   Lab 04/16/21  0624 04/15/21  0310 04/14/21  0341 04/13/21  1526   WBC 4.99 5.10 4.05 5.06   Hgb 11.9 11.8 11.3* 12.2   Hematocrit 40.1 38.3 35.1 38.7   Platelets 93* 88* 82* 90*   MCV 96.2* 94.1 90.2 90.0   Neutrophils  --   --   --  72.7       CMP  Recent Labs   Lab 04/17/21  0407 04/16/21  0624 04/15/21  0310 04/14/21  0341 04/13/21  1526   Sodium  --  140 140 139 134*   Potassium  --  4.0 3.7 3.5 4.2   Chloride  --  105 104 104 99   CO2  --  28 27 25 19    BUN  --  26.0* 29.0* 42.0* 69.0*   Creatinine  --  4.1* 4.6* 7.3* 10.2*   Glucose  --  220* 240* 178* 523*    Calcium  --  7.4* 7.3* 7.5* 8.0*   Phosphorus 2.7  --   --   --  6.8*   Protein, Total  --   --   --   --  5.9*   Albumin  --   --   --   --  2.5*   AST (SGOT)  --   --   --   --  18   ALT  --   --   --   --  23   Alkaline Phosphatase  --   --   --   --  229*   Bilirubin, Total  --   --   --   --  0.6       Lipid panel              Lab Results   Component Value Date    TSH 0.97 02/05/2021       Cardiac enzymes              Culture:   Microbiology Results (last 15 days)       Procedure Component Value Units Date/Time    Culture + Gram Stain,Aerobic, Wound [[id number]] Collected: 04/14/21 1609    Order Status: Completed Specimen: Wound Updated: 04/17/21 2031    Narrative:      [id number] called Micro Results of MRSA. Read back [id number], by [id number] on  04/17/2021 at 20:30  ESwab  ORDER#: [id number]                                    ORDERED BY: CHOUDHARY, IMTI  SOURCE: Wound Abdominal wall wound                   COLLECTED:  04/14/21 16:09  ANTIBIOTICS AT COLL.:                                RECEIVED :  04/14/21 22:37  ORDER ENTRY COMMENTS:  ESwab  [id number] called Micro Results of MRSA. Read back [id number], by [id number] on 04/17/2021 at 20:30  Stain, Gram                                FINAL       04/15/21 00:03  04/15/21   No Squamous epithelial cells seen             No WBCs or organisms seen  Culture and Gram Stain, Aerobic, Wound     FINAL       04/17/21 20:31   +  04/15/21   Heavy growth of Staphylococcus aureus               Refer to susceptibilities on culture #[id number]        Culture + Gram Lenise Arena Wound [[id number]] Collected: 04/14/21 1201    Order Status: Completed Specimen: Wound Updated: 04/17/21 1648    Narrative:      Oren Binet Culturette  ORDER#: [id number]                                    ORDERED BY: Sharlene Motts  SOURCE: Wound abdomen  COLLECTED:  04/14/21 12:01  ANTIBIOTICS AT COLL.:                                RECEIVED :  04/14/21 15:22  ORDER ENTRY COMMENTS:  Blue  Culturette  Stain, Gram                                FINAL       04/14/21 16:55   +  04/14/21   No Squamous epithelial cells seen             Many WBCs             Moderate Gram positive cocci  Culture and Gram Stain, Aerobic, Wound     FINAL       04/17/21 16:48   +  04/17/21   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      _____________________________________________________________________________                                      MRSA        ANTIBIOTICS                     MIC  INTRP      _____________________________________________________________________________  Clindamycin                    <=0.5   S        Daptomycin                      <=1    S        Erythromycin                    >4     R        Gentamicin                      <=2    S        Levofloxacin                     4     I        Linezolid                        2     S        Oxacillin                       >2     R  D1    Penicillin                      >1     R        Rifampin                       <=0.5   S        Tetracycline                   <=0.5   S        Trimethoprim/Sulfamethoxazole <=0.5/9  S  Vancomycin                       1     S          -----DRUG COMMENTS----------    D1:  Oxacillin sensitivity predicts sensitivity to cephalosporins.         [place] [employer] 2016  _____________________________________________________________________________            S=SUSCEPTIBLE     I=INTERMEDIATE     R=RESISTANT       N/S=NON-SUSCEPTIBLE     SDD=SUSCEPTIBLE-DOSE DEPENDENT  _____________________________________________________________________________      COVID-19 (SARS-CoV-2) and Influenza A/B, NAA (Liat Rapid)- Admission [[id number]] Collected: 04/13/21 1529    Order Status: Completed Specimen: Culturette from Nasopharyngeal Updated: 04/13/21 1615     Purpose of COVID testing Diagnostic -PUI     SARS-CoV-2 Specimen Source Nasal Swab     SARS CoV 2 Overall Result Not Detected     Comment:  __________________________________________________  -A result of "Detected" indicates POSITIVE for the    presence of SARS CoV-2 RNA  -A result of "Not Detected" indicates NEGATIVE for the    presence of SARS CoV-2 RNA  __________________________________________________________  Test performed using the Roche cobas Liat SARS-CoV-2 assay. This assay is  only for use under the Food and Drug Administration's Emergency Use  Authorization. This is a real-time RT-PCR assay for the qualitative  detection of SARS-CoV-2 RNA. Viral nucleic acids may persist in vivo,  independent of viability. Detection of viral nucleic acid does not imply the  presence of infectious virus, or that virus nucleic acid is the cause of  clinical symptoms. Negative results do not preclude SARS-CoV-2 infection and  should not be used as the sole basis for diagnosis, treatment or other  patient management decisions. Negative results must be combined with  clinical observations, patient history, and/or epidemiological information.  Invalid results may be due to inhibiting substances in the specimen and  recollection should occur. Please see Fact Sheets for patients and providers  located:  https://www.benson-chung.com/          Influenza A Not Detected     Influenza B Not Detected     Comment: Test performed using the Roche cobas Liat SARS-CoV-2 & Influenza A/B assay.  This assay is only for use under the Food and Drug Administration's  Emergency Use Authorization. This is a multiplex real-time RT-PCR assay  intended for the simultaneous in vitro qualitative detection and  differentiation of SARS-CoV-2, influenza A, and influenza B virus RNA. Viral  nucleic acids may persist in vivo, independent of viability. Detection of  viral nucleic acid does not imply the presence of infectious virus, or that  virus nucleic acid is the cause of clinical symptoms. Negative results do  not preclude SARS-CoV-2, influenza A, and/or influenza B  infection and  should not be used as the sole basis for diagnosis, treatment or other  patient management decisions. Negative results must be combined with  clinical observations, patient history, and/or epidemiological information.  Invalid results may be due to inhibiting substances in the specimen and  recollection should occur. Please see Fact Sheets for patients and providers  located: http://olson-hall.info/.         Narrative:      o Collect and clearly label specimen type:  o PREFERRED-Upper respiratory specimen: One Nasal Swab in  Transport Media.  o Hand deliver to laboratory ASAP  Diagnostic -PUI    Culture Blood Aerobic and Anaerobic [  [id number]] Collected: 04/13/21 1529    Order Status: Completed Specimen: Arm from Blood, Venipuncture Updated: 04/18/21 2221    Narrative:      ORDER#: [id number]                                    ORDERED BY: MANOOCHEHRI, OM  SOURCE: Blood, Venipuncture arm                      COLLECTED:  04/13/21 15:29  ANTIBIOTICS AT COLL.:                                RECEIVED :  04/13/21 20:03  Culture Blood Aerobic and Anaerobic        FINAL       04/18/21 22:21  04/18/21   No growth after 5 days of incubation.      Culture Blood Aerobic and Anaerobic [[id number]] Collected: 04/13/21 1529    Order Status: Completed Specimen: Arm from Blood, Venipuncture Updated: 04/18/21 2221    Narrative:      ORDER#: [id number]                                    ORDERED BY: MANOOCHEHRI, OM  SOURCE: Blood, Venipuncture arm                      COLLECTED:  04/13/21 15:29  ANTIBIOTICS AT COLL.:                                RECEIVED :  04/13/21 20:03  Culture Blood Aerobic and Anaerobic        FINAL       04/18/21 22:21  04/18/21   No growth after 5 days of incubation.              All radiology result for current encounter:  XR Chest  AP Portable    Result Date: 04/13/2021   Stable cardiac enlargement without radiographic evidence of acute edema nor pneumonia at this time. Isabelle Course, MD 04/13/2021 4:34 PM    CT Abdomen Pelvis WO IV/ WO PO Cont    Result Date: 04/13/2021  1.Focal 3.5 x 3.2 x 3.2 cm fluid collection with surrounding fat stranding and a focus of gas extending from the abdominal wall to the skin surface, likely the site of the prior peritoneal dialysis catheter. Findings likely represent active inflammation or a developing abscess. Clinical correlation is recommended. 2.Extensive diffuse anasarca. Mild abdominal ascites. Trace bilateral pleural effusions. 3.Nonspecific left inguinal lymphadenopathy, possibly reactive. 4.Cholelithiasis. 5.Small hiatal hernia. 6.Leiomyomatous uterus. 7.Cardiomegaly. Cynda Familia, MD 04/13/2021 4:17 PM     Echo Results       None              Physical Exam:   BP 161/82   Pulse 61   Temp 97.7 F (36.5 C) (Oral)   Resp 17   Ht 1.575 m (5\' 2" )   Wt 110.6 kg (243 lb 13.4 oz)   LMP  (LMP Unknown) Comment: post menopausal  SpO2 97%   BMI 44.60 kg/m     General appearance - alert, and in no distress  HEENT: Normocephalic,atraumatic, pupil equal  Neck - supple  Chest - clear to auscultation  Heart - normal rate and regular rhythm  Abdomen - bowel sounds normal, soft, non distended  Wound on anterior abdominal wall with serous drainage.  Lot Of scar tissue   Extremities - no pedal edema  Neurological - Alert, no gross focal deficit    Discharge condition:   Stable     Discharge  Diet :   Cardiac     Discharge Instructions:   Follow up:     [facility], [address]  [place] [address]  [phone]          [facility], [address]  [place] [address]  [phone]    Schedule an appointment as soon as possible for a visit  Get Seen with in 1-2 [place]  [address]  [place] [address]  [phone]    Schedule an appointment as soon as possible for a visit          Barbaraann Faster, MD  04/19/2021  9:35 AM  Time spent 45  minutes

## 2021-04-19 NOTE — Progress Notes (Signed)
Infectious Diseases & Tropical Medicine  Progress Note    04/19/2021   Jacie Tristan OMV:[id number],EZM:[id number] is a 66 y.o. female,       Assessment:     Abdominal wall abscess/wound at the site of previous peritoneal dialysis catheter (removed 2 years ago)  S/p incision and drainage by general surgery (04/15/2021)  Wound cultures-MRSA  End-stage renal disease on hemodialysis  Diabetes mellitus  COPD  Sleep apnea  No fever or leukocytosis  Clinically stable    Plan:     Continue vancomycin for hemodialysis until 05/27/2021  Pharmacy monitoring vancomycin levels  Continue local wound care  Continue probiotics  Monitor clinically      Discussed with patient in detail  Discussed with nursing staff    ROS:     General:  no fever, no chills, awake and alert, comfortable  HEENT: no neck pain, no throat pain  Endocrine:  no fatigue, no night sweats  Respiratory: no cough, shortness of breath, or wheezing   Cardiovascular: no chest pain   Gastrointestinal: Complains of pain at the abdominal wall wound, diarrhea better today  Genito-Urinary: no hematuria  Musculoskeletal: no edema  Neurological: c/o generalized weakness   Dermatological: no rash, no ulcer    Physical Examination:     Blood pressure 161/82, pulse 61, temperature 97.7 F (36.5 C), temperature source Oral, resp. rate 17, height 1.575 m (5\' 2" ), weight 110.6 kg (243 lb 13.4 oz), SpO2 97 %.    General Appearance: Awake and alert  HEENT: Pupils are equal, round, and reactive to light.   Lungs: Clear to auscultation  Heart:  Regular rate and rhythm  Chest: Symmetric chest wall expansion.   Abdomen: soft ,non tender,no hepatosplenomegaly,good bowel sounds, abdominal wall wound with dressing in place  Neurological: No focal deficit  Extremities: No edema    Laboratory And Diagnostic Studies:     No results for input(s): WBC, HGB, HCT, PLT in the last 72 hours.    No results for input(s): NA, K, CL, CO2, BUN, CREAT, GLU, CA in the last 72 hours.    No results  for input(s): AST, ALT, ALKPHOS, PROT, ALB in the last 72 hours.      Current Meds:      Scheduled Meds: PRN Meds:    amLODIPine, 10 mg, Oral, Daily  aspirin, 81 mg, Oral, Daily  atorvastatin, 40 mg, Oral, Daily  carvedilol, 25 mg, Oral, Q12H [facility]  dextromethorphan-guaiFENesin, 1 tablet, Oral, Q12H  gabapentin, 100 mg, Oral, Q8H [facility]  insulin glargine, 28 Units, Subcutaneous, QHS  insulin lispro, 1-4 Units, Subcutaneous, QHS  insulin lispro, 1-8 Units, Subcutaneous, TID AC  insulin lispro, 10 Units, Subcutaneous, Once  lactobacillus/streptococcus, 1 capsule, Oral, Daily  lidocaine-EPINEPHrine, 20 mL, Other, Once  mupirocin, , Topical, Daily at 1200  pantoprazole, 40 mg, Oral, QAM AC  petrolatum, , Topical, Q12H  piperacillin-tazobactam, 2.25 g, Intravenous, Q12H [facility]  sevelamer, 800 mg, Oral, TID MEALS  vancomycin, , Intravenous, See Admin Instructions        Continuous Infusions:   acetaminophen, 500 mg, Q6H PRN  albuterol sulfate HFA, 2 puff, Q6H PRN  dextrose, 15 g of glucose, PRN   Or  dextrose, 12.5 g, PRN   Or  dextrose, 12.5 g, PRN   Or  glucagon (rDNA), 1 mg, PRN  diphenhydrAMINE, 6.25 mg, Q6H PRN  HYDROmorphone, 0.4 mg, Once PRN  melatonin, 3 mg, QHS PRN  naloxone, 0.2 mg, PRN  ondansetron, 4 mg, Q8H PRN   Or  ondansetron, 4 mg, Q8H PRN  oxyCODONE-acetaminophen, 1 tablet, Q4H PRN  petrolatum, , PRN          Diyana Starrett A. Gustavus Messing, M.D.  04/19/2021  6:50 AM

## 2021-04-19 NOTE — Progress Notes (Addendum)
Pt will d/c home. [facility] on Wed.  Family will provide transport.  CM made referral to [facility] for Home RN, PT. OT.  No further needs identified at this time.   04/19/21 1439   Discharge Disposition   Patient preference/choice provided? Yes   Physical Discharge Disposition Home   Mode of Transportation Car   Patient/Family/POA notified of transfer plan Yes   Patient agreeable to discharge plan/expected d/c date? Yes   Family/POA agreeable to discharge plan/expected d/c date? Yes   Bedside nurse notified of transport plan? Yes   CM Interventions   Multidisciplinary rounds/family meeting before d/c? Yes   Medicare Checklist   Is this a Medicare patient? No

## 2021-04-19 NOTE — Progress Notes (Signed)
D/w dr.Patel Nephrology , Patient will cont Vancomycin till 05/27/2021

## 2021-04-19 NOTE — Discharge Instr - Activity (Signed)
Activity as tolerated

## 2021-04-19 NOTE — Discharge Instr - AVS First Page (Addendum)
Date of Admission: 04/13/2021    Date of Discharge: 04/19/2021    Discharge Physician: Barbaraann Faster, MD, MD    Dear [name],     Thank you for choosing [facility] for your emergency care needs. We strive to provide EXCELLENT care to you and your family.     In an effort to explain clearly why you were here in the hospital, I've written a very brief summary. I hope that you find it useful. Other details including formal diagnosis, medication changes, follow up appointment recommendations, and access to MyChart for formal medical records can be found in this packet.       You were admitted for:     Abdominal wall abscess at PD catheter site. 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  Metabolic acidosis   Diabetes mellitus  Hypertension  Hyperlipidemia  Diabetic neuropathy  COPD  Obstructive sleep apnea  Severe obesity BMI 44.7  Continue vancomycin during dialysis until May 27, 2021  Have discussed with your nephrology/kidney doctor  Follow-up with your other specialist per schedule    If you were prescribed medications, they were either sent to your pharmacy or provided to you as paper prescriptions.      Please Make sure to follow up with your primary care doctor within 1 week.     Please don't hesitate to call me with any questions. It was a pleasure taking care of you, please do not hesitate to call me on number listed below for any questions      Finally, as your discharging physician, you may be receiving a survey which is regarding my care. I would greatly value and appreciate your feedback as I strive for excellence.     Respectfully yours,    Barbaraann Faster, MD, MD        [facility]   [address]   [address]   [place] [address]   [phone]

## 2021-04-19 NOTE — Progress Notes (Signed)
Wound care instructions given to pt. Supplies for wound care also given to pt.    Comments: To abdominal wound:   1. Clean with normal saline, rinse out hole.   2. Apply antibiotic ointment to the periwound skin.   3. Pack lightly with a small piece of Maxorb, apply more maxorb outside if needed for drainage.   4. Cover with ABD pad and tape.

## 2021-04-20 NOTE — Progress Notes (Addendum)
Start [facility] Note  Home Health Referral    Referral from Toney Rakes (Case Manager) for home health care upon discharge.    By [employer], the patient has the right to freely choose a home care provider.    A company of the patients choosing. We have supplied the patient with a listing of providers in your area who asked to be included and participate in Medicare.   [facility], formerly [place], a home care agency that provides adult home care services and participates in Medicare   The preferred provider of your insurance company. Choosing a home care provider other than your insurance company's preferred provider may affect your insurance coverage.      Home Health Discharge Information    Your doctor has ordered Skilled Nursing, Physical Therapy, and Occupational Therapy in-home service(s) for you while you recuperate at home, to assist you in the transition from hospital to home.    The agency that you or your representative chose to provide the service:  Name of [facility]: [facility] [phone]        The above services were set up by:  Sharyn Blitz Treshaun Carrico, RN ([place]) Phone [phone]      IF YOU HAVE NOT HEARD [place] 24-48 HOURS AFTER DISCHARGE PLEASE CALL YOUR AGENCY TO ARRANGE A TIME FOR YOUR FIRST VISIT. FOR ANY SCHEDULING CONCERNS OR QUESTIONS RELATED TO HOME HEALTH, SUCH AS TIME OR DATE PLEASE CONTACT YOUR HOME HEALTH AGENCY AT THE NUMBER LISTED ABOVE.    Additional comments: Spoke with patient at bedside to discuss [facility] services. Patient agreeable to services with no preference of [place] agency.          START PATIENT REGISTRATION INFORMATION     Care Coordination   Primary Care Physician:Shanti Reesa Chew, MD  Primary Care Physician [phone]  Primary Care Physician Address: [address] [place] [place] [address] [place] [address]  PCP NPI: [id number]    Demographics  Patient Last Name: Lipa   Patient  First Name: Anne Ng  Language/Communication Barrier: none   Service Address: [address] [place] [address]   Service Home Phone: [phone] (home)   Other phone numbers:    Telephone Information:   Mobile [phone]     Emergency Contact: Extended Emergency Contact Information  Primary Emergency Contact: okoronwo,shannon  Mobile Phone: [phone]  Relation: Daughter  Interpreter needed? No  Secondary Emergency Contact: [place] Sr  Mobile Phone: [phone]  Relation: Spouse  Primary Phone Number:  [phone]  Secondary Phone Number:  [phone]    Admission Information  Admit Date: 04/13/2021  Patient Status at discharge:  OBS  Admitting Diagnosis: Volume overload [E87.70]     HITECH  Wound care to abdominal wound:   1. Clean with normal saline, rinse out hole.   2. Apply antibiotic ointment to the periwound skin.   3. Pack lightly with a small piece of Maxorb, apply more maxorb outside if needed for drainage.   4. Cover with ABD pad and tape.      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 N/A    Additional Comments: None    End [facility] Summary     Discharge Date:  04/19/2021    Referral Source  Signed by: Sharyn Blitz Kishaun Erekson, RN  Date Time: 04/20/21 11:20 AM      End [facility] Note               Home Health face-to-face (FTF) Encounter (Order [id number])  Consult  Date: 04/20/2021 Department: [address] [place]  Ordering/Authorizing: Barbaraann Faster, MD     Order Information    Order Date/Time Release Date/Time Start Date/Time End Date/Time   04/20/21 10:18 AM None 04/20/21 10:13 AM 04/20/21 10:13 AM     Order Details    Frequency Duration Priority Order Class   Once 1  occurrence Routine Hospital Performed     Standing Order Information    Remaining Occurrences Interval Last Released     0/1 Once 04/20/2021              Provider Information    Ordering User Ordering Provider Authorizing Provider   Deberah Adolf, Sharyn Blitz, RN Barbaraann Faster, MD Barbaraann Faster, MD   Attending Provider(s) Admitting Provider PCP   Manoochehri, Sandria Manly, MD; Barbaraann Faster, MD Barbaraann Faster, MD Horton Marshall, MD     Verbal Order Info    Action Created on Order Mode Entered by Responsible Provider Signed by Signed on   Ordering 04/20/21 1018 Telephone with readback Agamjot Kilgallon, Sharyn Blitz, RN Barbaraann Faster, MD             Comments    Home nursing required for skilled assessment including cardiopulmonary assessment and dietary education for disease management, and medication instruction. Home PT/OT required for gait and balance training, strengthening, mobility, fall prevention, and ADL training.     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 Clean with normal saline, rinse out hole.   2. Apply antibiotic ointment to the periwound skin.   3. Pack lightly with a small piece of Maxorb, apply more maxorb outside if needed for drainage.   4. Cover with ABD pad and tape.                Home Health face-to-face (FTF) Encounter: Patient  Communication     Not Released  Not seen         Order Questions    Question Answer   Date I saw the patient face-to-face: 04/20/2021   Evidence this patient is homebound because: C.  Decreased endurance, strength, ROM, cadence, safety/judgment during mobility    G.  Fall  risk due to impaired coordination, gait and decreased balance    I.  Restricted to home to decrease risk of infection    N.  Impaired mobility d/t pain, arthritis, weakness that compromises patient safety   Medical conditions that necessitate Home Health care: B.  Functional impairment due to recent hospitalization/procedure/treatment    C.  Risk for complication/infection/pain requiring follow up and monitoring    E.  Exacerbation of disease requiring follow up monitoring    F.  New diagnosis & treatment requiring follow up monitoring and management    H.  Multiple new medications requiring management and monitoring    G.  High risk/complex medications requiring instruction and management   Per clinical findings, following services are medically necessary: PT    OT    Skilled Nursing   Clinical findings that support the need for Skilled Nursing. SN will: C. Monitor for signs and symptoms of exacerbation of disease and management    D. Review medication reconciliation, manage and educate on use and side effects    G. Educate on new diagnosis, treatment & management to prevent re-hospitalization    H. Assess cardiopulmonary status and monitor for signs &symptoms of exacerbation    I.  Educate dietary and or fluid restrictions and weight management    K. Educate on complex wound care and management   Clinical findings that support the need for Physical Therapy. PT will A.  Evaluate and treat functional impairment and improve mobility    C.  Educate on weight bearing status, stair/gait training, balance & coordination    D.  Provide services to help restore function, mobility, and releive pain    E.  Educate on functional mobility; bed, chair, sit,  stand and transfer activities    F.  Perform home safety assessment & develop safe in home exercise program    G.  Implement activities to improve stance time, cadence & step length   OT will provide assistance with: Home program to improve ability to perform ADLs    Recovery and maintenance skills    Basic motor function and reasoning abilities   Other (please specify) See comments                    Process Instructions    Please select Home Care Services medically necessary.     Based on the above findings, I certify that this patient is confined to the home and needs intermittent skilled nursing care, physical therapry and / or speech therapy or continues to need occupational therapy. The patient is under my care, and I have initiated the establishment of the plan of care. This patient will be followed by a physician who will periodically review the plan of care.      Collection Information            Consult Order Info    ID Description Priority Start Date Start Time   [id number] [place] face-to-face (FTF) Encounter Routine 04/20/2021 10:13 AM   Provider Specialty Referred to   ______________________________________ _____________________________________                         Verbal Order Info    Action Created on Order Mode Entered by Responsible Provider Signed by Signed on   Ordering 04/20/21 1018 Telephone with readback Cruzito Standre, Sharyn Blitz, RN Barbaraann Faster, MD             Patient Information  Patient Name   Albertia, Carvin Legal Sex   Female DOB   1955/05/25       Reprint Order [place] face-to-face (FTF) Encounter (Order #[id number]) on 04/20/21       Additional Information    Associated Reports External References   Priority and Order Details [employer]

## 2021-04-23 ENCOUNTER — Other Ambulatory Visit: Payer: Self-pay

## 2021-04-23 ENCOUNTER — Emergency Department: Payer: 59

## 2021-04-23 ENCOUNTER — Observation Stay
Admission: EM | Admit: 2021-04-23 | Discharge: 2021-05-06 | Disposition: A | Discharge status: Skilled Nursing Facility | Payer: 59 | Attending: Internal Medicine | Admitting: Internal Medicine

## 2021-04-23 ENCOUNTER — Encounter: Payer: Self-pay | Admitting: Internal Medicine

## 2021-04-23 ENCOUNTER — Observation Stay: Payer: 59

## 2021-04-23 DIAGNOSIS — Z91158 Patient's noncompliance with renal dialysis for other reason: Secondary | ICD-10-CM | POA: Insufficient documentation

## 2021-04-23 DIAGNOSIS — E1151 Type 2 diabetes mellitus with diabetic peripheral angiopathy without gangrene: Secondary | ICD-10-CM | POA: Insufficient documentation

## 2021-04-23 DIAGNOSIS — K449 Diaphragmatic hernia without obstruction or gangrene: Secondary | ICD-10-CM | POA: Insufficient documentation

## 2021-04-23 DIAGNOSIS — D631 Anemia in chronic kidney disease: Secondary | ICD-10-CM | POA: Insufficient documentation

## 2021-04-23 DIAGNOSIS — N2581 Secondary hyperparathyroidism of renal origin: Secondary | ICD-10-CM | POA: Insufficient documentation

## 2021-04-23 DIAGNOSIS — G4733 Obstructive sleep apnea (adult) (pediatric): Secondary | ICD-10-CM | POA: Insufficient documentation

## 2021-04-23 DIAGNOSIS — E1165 Type 2 diabetes mellitus with hyperglycemia: Secondary | ICD-10-CM | POA: Insufficient documentation

## 2021-04-23 DIAGNOSIS — Z7982 Long term (current) use of aspirin: Secondary | ICD-10-CM | POA: Insufficient documentation

## 2021-04-23 DIAGNOSIS — Z794 Long term (current) use of insulin: Secondary | ICD-10-CM | POA: Insufficient documentation

## 2021-04-23 DIAGNOSIS — J811 Chronic pulmonary edema: Secondary | ICD-10-CM | POA: Insufficient documentation

## 2021-04-23 DIAGNOSIS — N6489 Other specified disorders of breast: Secondary | ICD-10-CM | POA: Insufficient documentation

## 2021-04-23 DIAGNOSIS — R600 Localized edema: Secondary | ICD-10-CM | POA: Insufficient documentation

## 2021-04-23 DIAGNOSIS — G9341 Metabolic encephalopathy: Secondary | ICD-10-CM | POA: Insufficient documentation

## 2021-04-23 DIAGNOSIS — Z992 Dependence on renal dialysis: Secondary | ICD-10-CM | POA: Insufficient documentation

## 2021-04-23 DIAGNOSIS — L02211 Cutaneous abscess of abdominal wall: Secondary | ICD-10-CM | POA: Insufficient documentation

## 2021-04-23 DIAGNOSIS — J9611 Chronic respiratory failure with hypoxia: Secondary | ICD-10-CM | POA: Insufficient documentation

## 2021-04-23 DIAGNOSIS — J9811 Atelectasis: Secondary | ICD-10-CM | POA: Insufficient documentation

## 2021-04-23 DIAGNOSIS — I1311 Hypertensive heart and chronic kidney disease without heart failure, with stage 5 chronic kidney disease, or end stage renal disease: Secondary | ICD-10-CM | POA: Insufficient documentation

## 2021-04-23 DIAGNOSIS — K8020 Calculus of gallbladder without cholecystitis without obstruction: Secondary | ICD-10-CM | POA: Insufficient documentation

## 2021-04-23 DIAGNOSIS — Z7952 Long term (current) use of systemic steroids: Secondary | ICD-10-CM | POA: Insufficient documentation

## 2021-04-23 DIAGNOSIS — N6320 Unspecified lump in the left breast, unspecified quadrant: Secondary | ICD-10-CM | POA: Insufficient documentation

## 2021-04-23 DIAGNOSIS — R590 Localized enlarged lymph nodes: Secondary | ICD-10-CM | POA: Insufficient documentation

## 2021-04-23 DIAGNOSIS — E875 Hyperkalemia: Secondary | ICD-10-CM | POA: Insufficient documentation

## 2021-04-23 DIAGNOSIS — R234 Changes in skin texture: Secondary | ICD-10-CM | POA: Insufficient documentation

## 2021-04-23 DIAGNOSIS — R4182 Altered mental status, unspecified: Principal | ICD-10-CM | POA: Insufficient documentation

## 2021-04-23 DIAGNOSIS — Z79899 Other long term (current) drug therapy: Secondary | ICD-10-CM | POA: Insufficient documentation

## 2021-04-23 DIAGNOSIS — E1122 Type 2 diabetes mellitus with diabetic chronic kidney disease: Secondary | ICD-10-CM | POA: Insufficient documentation

## 2021-04-23 DIAGNOSIS — Z6841 Body Mass Index (BMI) 40.0 and over, adult: Secondary | ICD-10-CM | POA: Insufficient documentation

## 2021-04-23 DIAGNOSIS — E785 Hyperlipidemia, unspecified: Secondary | ICD-10-CM | POA: Insufficient documentation

## 2021-04-23 DIAGNOSIS — S20212A Contusion of left front wall of thorax, initial encounter: Secondary | ICD-10-CM | POA: Insufficient documentation

## 2021-04-23 DIAGNOSIS — S90111A Contusion of right great toe without damage to nail, initial encounter: Secondary | ICD-10-CM | POA: Insufficient documentation

## 2021-04-23 DIAGNOSIS — N186 End stage renal disease: Secondary | ICD-10-CM | POA: Insufficient documentation

## 2021-04-23 DIAGNOSIS — R55 Syncope and collapse: Secondary | ICD-10-CM | POA: Insufficient documentation

## 2021-04-23 DIAGNOSIS — Z20822 Contact with and (suspected) exposure to covid-19: Secondary | ICD-10-CM | POA: Insufficient documentation

## 2021-04-23 DIAGNOSIS — W010XXA Fall on same level from slipping, tripping and stumbling without subsequent striking against object, initial encounter: Secondary | ICD-10-CM | POA: Insufficient documentation

## 2021-04-23 DIAGNOSIS — I272 Pulmonary hypertension, unspecified: Secondary | ICD-10-CM | POA: Insufficient documentation

## 2021-04-23 DIAGNOSIS — J449 Chronic obstructive pulmonary disease, unspecified: Secondary | ICD-10-CM | POA: Insufficient documentation

## 2021-04-23 DIAGNOSIS — B9562 Methicillin resistant Staphylococcus aureus infection as the cause of diseases classified elsewhere: Secondary | ICD-10-CM | POA: Insufficient documentation

## 2021-04-23 DIAGNOSIS — S301XXA Contusion of abdominal wall, initial encounter: Secondary | ICD-10-CM | POA: Insufficient documentation

## 2021-04-23 LAB — GLUCOSE WHOLE BLOOD - POCT
Whole Blood Glucose POCT: 275 mg/dL — ABNORMAL HIGH (ref 70–100)
Whole Blood Glucose POCT: 257 mg/dL — ABNORMAL HIGH (ref 70–100)
Whole Blood Glucose POCT: 233 mg/dL — ABNORMAL HIGH (ref 70–100)
Whole Blood Glucose POCT: 293 mg/dL — ABNORMAL HIGH (ref 70–100)

## 2021-04-23 LAB — BLOOD GAS, ARTERIAL
Arterial Total CO2: 46 mEq/L — ABNORMAL HIGH (ref 24.0–30.0)
Arterial Total CO2: 52.8 mEq/L — ABNORMAL HIGH (ref 24.0–30.0)
Base Excess, Arterial: -2.4 mEq/L — ABNORMAL LOW (ref <=2.0)
Base Excess, Arterial: -3 mEq/L — ABNORMAL LOW (ref <=2.0)
HCO3, Arterial: 22.3 mEq/L — ABNORMAL LOW (ref 23.0–29.0)
HCO3, Arterial: 24.7 mEq/L (ref 23.0–29.0)
O2 Flow: 2 L/min
O2 Sat, Arterial: 97.7 % (ref 95.0–100.0)
O2 Sat, Arterial: 45.3 % — ABNORMAL LOW (ref 95.0–100.0)
Temperature: 37
Temperature: 37
pCO2, Arterial: 59.5 mmhg — ABNORMAL HIGH (ref 35.0–45.0)
pH, Arterial: 7.359 (ref 7.350–7.450)
pH, Arterial: 7.242 — ABNORMAL LOW (ref 7.350–7.450)
pO2, Arterial: 113 mmhg — ABNORMAL HIGH (ref 80.0–90.0)
pO2, Arterial: <42.7 mmhg — ABNORMAL LOW (ref 80.0–90.0)

## 2021-04-23 LAB — COMPREHENSIVE METABOLIC PANEL
ALT: 31 U/L (ref 0–55)
AST (SGOT): 20 U/L (ref 5–41)
Albumin/Globulin Ratio: 0.7 — ABNORMAL LOW (ref 0.9–2.2)
Albumin: 2.7 g/dL — ABNORMAL LOW (ref 3.5–5.0)
Alkaline Phosphatase: 279 U/L — ABNORMAL HIGH (ref 37–117)
Anion Gap: 14 (ref 5.0–15.0)
BUN: 57 mg/dL — ABNORMAL HIGH (ref 7.0–21.0)
Bilirubin, Total: 0.6 mg/dL (ref 0.2–1.2)
CO2: 23 mEq/L (ref 17–29)
Calcium: 8.4 mg/dL — ABNORMAL LOW (ref 8.5–10.5)
Chloride: 99 mEq/L (ref 99–111)
Creatinine: 7.6 mg/dL — ABNORMAL HIGH (ref 0.4–1.0)
Globulin: 3.8 g/dL — ABNORMAL HIGH (ref 2.0–3.6)
Glucose: 320 mg/dL — ABNORMAL HIGH (ref 70–100)
Potassium: 5.8 mEq/L — ABNORMAL HIGH (ref 3.5–5.3)
Protein, Total: 6.5 g/dL (ref 6.0–8.3)
Sodium: 136 mEq/L (ref 135–145)

## 2021-04-23 LAB — COVID-19 (SARS-COV-2): SARS CoV 2 Overall Result: NOT DETECTED

## 2021-04-23 LAB — CBC AND DIFFERENTIAL
Absolute NRBC: 0 10*3/uL (ref 0.00–0.00)
Basophils Absolute Automated: 0.08 10*3/uL (ref 0.00–0.08)
Basophils Automated: 1.2 %
Eosinophils Absolute Automated: 0.03 10*3/uL (ref 0.00–0.44)
Eosinophils Automated: 0.5 %
Hematocrit: 36.2 % (ref 34.7–43.7)
Hgb: 11.1 g/dL — ABNORMAL LOW (ref 11.4–14.8)
Immature Granulocytes Absolute: 0.02 10*3/uL (ref 0.00–0.07)
Immature Granulocytes: 0.3 %
Instrument Absolute Neutrophil Count: 4.52 10*3/uL (ref 1.10–6.33)
Lymphocytes Absolute Automated: 1.05 10*3/uL (ref 0.42–3.22)
Lymphocytes Automated: 16.4 %
MCH: 28.8 pg (ref 25.1–33.5)
MCHC: 30.7 g/dL — ABNORMAL LOW (ref 31.5–35.8)
MCV: 94 fL (ref 78.0–96.0)
MPV: 12.2 fL (ref 8.9–12.5)
Monocytes Absolute Automated: 0.71 10*3/uL (ref 0.21–0.85)
Monocytes: 11.1 %
Neutrophils Absolute: 4.52 10*3/uL (ref 1.10–6.33)
Neutrophils: 70.5 %
Nucleated RBC: 0 /100 WBC (ref 0.0–0.0)
Platelets: 103 10*3/uL — ABNORMAL LOW (ref 142–346)
RBC: 3.85 10*6/uL — ABNORMAL LOW (ref 3.90–5.10)
RDW: 14 % (ref 11–15)
WBC: 6.41 10*3/uL (ref 3.10–9.50)

## 2021-04-23 LAB — WHOLE BLOOD GLUCOSE POCT: Whole Blood Glucose POCT: 332 mg/dL — ABNORMAL HIGH (ref 70–100)

## 2021-04-23 LAB — ARTERIAL BLOOD GAS
FIO2: 100 %
O2 Flow: 15 L/min
pCO2, Arterial: 40.5 mmhg (ref 35.0–45.0)

## 2021-04-23 LAB — GFR: EGFR: 6.5

## 2021-04-23 LAB — ETHANOL: Alcohol: NOT DETECTED mg/dL

## 2021-04-23 MED ORDER — LACTULOSE 10 GM/15ML PO SOLN
10.0000 g | Freq: Every day | ORAL | Status: DC | PRN
Start: 2021-04-23 — End: 2021-05-06

## 2021-04-23 MED ORDER — GLUCAGON 1 MG IJ SOLR (WRAP)
1.0000 mg | INTRAMUSCULAR | Status: DC | PRN
Start: 2021-04-23 — End: 2021-04-25

## 2021-04-23 MED ORDER — HYDRALAZINE HCL 20 MG/ML IJ SOLN
20.0000 mg | Freq: Once | INTRAMUSCULAR | Status: DC
Start: 2021-04-23 — End: 2021-04-23

## 2021-04-23 MED ORDER — SENNOSIDES-DOCUSATE SODIUM 8.6-50 MG PO TABS
2.0000 | ORAL_TABLET | Freq: Every evening | ORAL | Status: DC
Start: 2021-04-23 — End: 2021-05-06
  Administered 2021-04-23 – 2021-05-05 (×9): 2 via ORAL
  Filled 2021-04-23 (×13): qty 2

## 2021-04-23 MED ORDER — ALBUTEROL-IPRATROPIUM 2.5-0.5 (3) MG/3ML IN SOLN
3.0000 mL | Freq: Four times a day (QID) | RESPIRATORY_TRACT | Status: DC
Start: 2021-04-24 — End: 2021-04-24
  Administered 2021-04-24: 3 mL via RESPIRATORY_TRACT
  Filled 2021-04-23 (×2): qty 3

## 2021-04-23 MED ORDER — INSULIN LISPRO 100 UNIT/ML SOLN (WRAP)
1.0000 [IU] | Freq: Every evening | Status: DC
Start: 2021-04-23 — End: 2021-04-25
  Administered 2021-04-23: 1 [IU] via SUBCUTANEOUS
  Administered 2021-04-24: 3 [IU] via SUBCUTANEOUS
  Filled 2021-04-23: qty 3
  Filled 2021-04-23: qty 9

## 2021-04-23 MED ORDER — SODIUM CHLORIDE 0.9 % IV MBP
1.0000 g | INTRAVENOUS | Status: DC
Start: 2021-04-23 — End: 2021-04-29
  Administered 2021-04-23 – 2021-04-28 (×6): 1 g via INTRAVENOUS
  Filled 2021-04-23 (×6): qty 1000

## 2021-04-23 MED ORDER — MELATONIN 3 MG PO TABS
3.0000 mg | ORAL_TABLET | Freq: Every evening | ORAL | Status: DC | PRN
Start: 2021-04-23 — End: 2021-05-06

## 2021-04-23 MED ORDER — HEPARIN SODIUM (PORCINE) 5000 UNIT/ML IJ SOLN
5000.0000 [IU] | Freq: Two times a day (BID) | INTRAMUSCULAR | Status: DC
Start: 2021-04-23 — End: 2021-05-06
  Administered 2021-04-23 – 2021-05-06 (×16): 5000 [IU] via SUBCUTANEOUS
  Filled 2021-04-23 (×24): qty 1

## 2021-04-23 MED ORDER — GLUCOSE 40 % PO GEL (WRAP)
15.0000 g | ORAL | Status: DC | PRN
Start: 2021-04-23 — End: 2021-04-25

## 2021-04-23 MED ORDER — CARVEDILOL 25 MG PO TABS
25.0000 mg | ORAL_TABLET | Freq: Two times a day (BID) | ORAL | Status: DC
Start: 2021-04-23 — End: 2021-05-06
  Administered 2021-04-23 – 2021-05-06 (×25): 25 mg via ORAL
  Filled 2021-04-23 (×26): qty 1

## 2021-04-23 MED ORDER — SEVELAMER CARBONATE 800 MG PO TABS
800.0000 mg | ORAL_TABLET | Freq: Two times a day (BID) | ORAL | Status: DC
Start: 2021-04-23 — End: 2021-05-02
  Administered 2021-04-23 – 2021-05-02 (×18): 800 mg via ORAL
  Filled 2021-04-23 (×20): qty 1

## 2021-04-23 MED ORDER — ACETAMINOPHEN 325 MG PO TABS
650.0000 mg | ORAL_TABLET | Freq: Four times a day (QID) | ORAL | Status: AC | PRN
Start: 2021-04-23 — End: 2021-04-23

## 2021-04-23 MED ORDER — AMLODIPINE BESYLATE 5 MG PO TABS
10.0000 mg | ORAL_TABLET | Freq: Every day | ORAL | Status: DC
Start: 2021-04-23 — End: 2021-05-06
  Administered 2021-04-23 – 2021-05-06 (×13): 10 mg via ORAL
  Filled 2021-04-23 (×14): qty 2

## 2021-04-23 MED ORDER — DEXTROSE 10 % IV BOLUS
12.5000 g | INTRAVENOUS | Status: DC | PRN
Start: 2021-04-23 — End: 2021-04-25

## 2021-04-23 MED ORDER — INSULIN SYRINGES (DISPOSABLE) U-100 1 ML MISC
5.0000 [IU] | Freq: Once | Status: AC
Start: 2021-04-23 — End: 2021-04-23
  Administered 2021-04-23: 5 [IU] via INTRAVENOUS
  Filled 2021-04-23: qty 15

## 2021-04-23 MED ORDER — KETOROLAC TROMETHAMINE 15 MG/ML IJ SOLN
15.0000 mg | Freq: Once | INTRAMUSCULAR | Status: DC
Start: 2021-04-23 — End: 2021-04-23

## 2021-04-23 MED ORDER — DEXTROSE 10 % IV BOLUS
50.0000 g | Freq: Once | INTRAVENOUS | Status: AC
Start: 2021-04-23 — End: 2021-04-23
  Administered 2021-04-23: 500 mL via INTRAVENOUS

## 2021-04-23 MED ORDER — NALOXONE HCL 0.4 MG/ML IJ SOLN (WRAP)
0.2000 mg | INTRAMUSCULAR | Status: DC | PRN
Start: 2021-04-23 — End: 2021-05-06

## 2021-04-23 MED ORDER — HYDRALAZINE HCL 20 MG/ML IJ SOLN
10.0000 mg | Freq: Once | INTRAMUSCULAR | Status: AC
Start: 2021-04-23 — End: 2021-04-23
  Administered 2021-04-23: 10 mg via INTRAVENOUS
  Filled 2021-04-23: qty 1

## 2021-04-23 MED ORDER — ONDANSETRON HCL 4 MG/2ML IJ SOLN
4.0000 mg | Freq: Four times a day (QID) | INTRAMUSCULAR | Status: DC | PRN
Start: 2021-04-23 — End: 2021-05-06

## 2021-04-23 MED ORDER — METHYLPREDNISOLONE SODIUM SUCC 40 MG IJ SOLR
40.0000 mg | Freq: Three times a day (TID) | INTRAMUSCULAR | Status: DC
Start: 2021-04-23 — End: 2021-04-25
  Administered 2021-04-23 – 2021-04-25 (×6): 40 mg via INTRAVENOUS
  Filled 2021-04-23 (×6): qty 1

## 2021-04-23 MED ORDER — AZITHROMYCIN 500 MG IN 250 ML NS IVPB VIAL-MATE (CNR)
500.0000 mg | INTRAVENOUS | Status: AC
Start: 2021-04-23 — End: 2021-04-28
  Administered 2021-04-23 – 2021-04-27 (×5): 500 mg via INTRAVENOUS
  Filled 2021-04-23 (×5): qty 500

## 2021-04-23 MED ORDER — MAGNESIUM SULFATE IN D5W 1-5 GM/100ML-% IV SOLN
1.0000 g | INTRAVENOUS | Status: DC | PRN
Start: 2021-04-23 — End: 2021-05-06

## 2021-04-23 MED ORDER — ONDANSETRON 4 MG PO TBDP
4.0000 mg | ORAL_TABLET | Freq: Four times a day (QID) | ORAL | Status: DC | PRN
Start: 2021-04-23 — End: 2021-05-06

## 2021-04-23 MED ORDER — ACETAMINOPHEN 650 MG RE SUPP
650.0000 mg | Freq: Four times a day (QID) | RECTAL | Status: DC | PRN
Start: 2021-04-23 — End: 2021-05-06

## 2021-04-23 MED ORDER — OXYCODONE-ACETAMINOPHEN 5-325 MG PO TABS
1.0000 | ORAL_TABLET | ORAL | Status: DC | PRN
Start: 2021-04-23 — End: 2021-05-06
  Administered 2021-04-23 – 2021-05-06 (×17): 1 via ORAL
  Filled 2021-04-23 (×17): qty 1

## 2021-04-23 MED ORDER — ATORVASTATIN CALCIUM 40 MG PO TABS
40.0000 mg | ORAL_TABLET | Freq: Every day | ORAL | Status: DC
Start: 2021-04-23 — End: 2021-05-06
  Administered 2021-04-23 – 2021-05-06 (×14): 40 mg via ORAL
  Filled 2021-04-23 (×14): qty 1

## 2021-04-23 MED ORDER — POLYETHYLENE GLYCOL 3350 17 G PO PACK
17.0000 g | PACK | Freq: Every day | ORAL | Status: DC | PRN
Start: 2021-04-23 — End: 2021-05-06

## 2021-04-23 MED ORDER — INSULIN LISPRO 100 UNIT/ML SOLN (WRAP)
1.0000 [IU] | Freq: Three times a day (TID) | Status: DC
Start: 2021-04-24 — End: 2021-04-25
  Administered 2021-04-24: 5 [IU] via SUBCUTANEOUS
  Administered 2021-04-24: 3 [IU] via SUBCUTANEOUS
  Administered 2021-04-24: 4 [IU] via SUBCUTANEOUS
  Administered 2021-04-25 (×2): 5 [IU] via SUBCUTANEOUS
  Administered 2021-04-25: 4 [IU] via SUBCUTANEOUS
  Filled 2021-04-23: qty 15
  Filled 2021-04-23: qty 9
  Filled 2021-04-23 (×3): qty 12
  Filled 2021-04-23: qty 27

## 2021-04-23 MED ORDER — ACETAMINOPHEN 325 MG PO TABS
650.0000 mg | ORAL_TABLET | Freq: Four times a day (QID) | ORAL | Status: DC | PRN
Start: 2021-04-23 — End: 2021-05-06
  Administered 2021-05-05: 650 mg via ORAL
  Filled 2021-04-23: qty 2

## 2021-04-23 MED ORDER — CALCIUM GLUCONATE 10 % IV SOLN
1.0000 g | Freq: Once | INTRAVENOUS | Status: AC
Start: 2021-04-23 — End: 2021-04-23
  Administered 2021-04-23: 1 g via INTRAVENOUS
  Filled 2021-04-23: qty 10

## 2021-04-23 MED ORDER — DEXTROSE 50 % IV SOLN
12.5000 g | INTRAVENOUS | Status: DC | PRN
Start: 2021-04-23 — End: 2021-04-25

## 2021-04-23 NOTE — ED Notes (Signed)
PT CLEANED AND PLACED IN NEW BRIEF. PT RESTING COMFORTABLY.

## 2021-04-23 NOTE — ED Notes (Signed)
[facility]  ED NURSING NOTE FOR THE RECEIVING INPATIENT NURSE   ED NURSE Vevelyn Francois [phone]   ED CHARGE RN [phone]   ADMISSION INFORMATION   [name] is a 66 y.o. female admitted with a diagnosis of:    No diagnosis found.     Isolation: Contact MRSA   Allergies: Lisinopril and Metformin   Holding Orders confirmed? Yes   Belongings Documented? Yes   Home medications sent to pharmacy confirmed? No   NURSING CARE   Patient Comes From:   Mental Status: Home/Family Care  oriented and lethargic   ADL: Normally independent , however with lethargy needs assist.    Ambulation: UTA- normally walks fine.    Pertinent Information  and Safety Concerns: [facility] dialysis, last dialysis was yesterday.      COVID Test sent to lab? Yes   VITAL SIGNS   Time BP Temp Pulse Resp SpO2   1714 193/84 98 64 16 93   CT / NIH   CT Head ordered on this patient?  Yes   NIH/Dysphagia assessment done prior to admission? Yes   PERSONAL PROTECTIVE EQUIPMENT   Gloves and Procedure Gown   LAB RESULTS   Labs Reviewed   CBC AND DIFFERENTIAL - Abnormal; Notable for the following components:       Result Value    Hgb 11.1 (*)     Platelets 103 (*)     RBC 3.85 (*)     MCHC 30.7 (*)     All other components within normal limits   COMPREHENSIVE METABOLIC PANEL - Abnormal; Notable for the following components:    Glucose 320 (*)     BUN 57.0 (*)     Creatinine 7.6 (*)     Potassium 5.8 (*)     Calcium 8.4 (*)     Albumin 2.7 (*)     Alkaline Phosphatase 279 (*)     Globulin 3.8 (*)     Albumin/Globulin Ratio 0.7 (*)     All other components within normal limits   GLUCOSE WHOLE BLOOD - POCT - Abnormal; Notable for the following components:    Whole Blood Glucose POCT 275 (*)     All other components within normal limits   GLUCOSE WHOLE BLOOD - POCT - Abnormal; Notable for the following components:    Whole Blood Glucose POCT 257 (*)     All other components within normal limits   GLUCOSE WHOLE BLOOD - POCT - Abnormal; Notable  for the following components:    Whole Blood Glucose POCT 332 (*)     All other components within normal limits   COVID-19 (SARS-COV-2)    Narrative:     o Collect and clearly label specimen type:  o PREFERRED-Upper respiratory specimen: One Nasal Swab in  [employer].  o Hand deliver to laboratory ASAP  Indication for testing->Extended care facility admission to  semi private room  Screening   ETHANOL   GFR   URINALYSIS REFLEX TO MICROSCOPIC EXAM - REFLEX TO CULTURE   BLOOD GAS, ARTERIAL          Ticket to Ride Printed: No

## 2021-04-23 NOTE — H&P (Addendum)
History and Physical Exam    Date Time: 04/23/21 9:33 PM  Patient Name: [name]  Attending Physician: Barbaraann Faster, MD  Primary Care Physician: Horton Marshall, MD    CC: Fall, mental status change    Assessment and Plan:      #.  Altered mental status  -Unclear etiology.  Patient drowsy, however answers appropriately.  Negative CT scan of the head    #.  Recent admission for abdominal wall abscess at the PD catheter site with MRSA  -Patient currently getting vancomycin with dialysis.  Continue the same.    #.  Hyperkalemia  -Received insulin D50 in the ER  Will repeat potassium levels    #.  Status post fall with injury to left chest right great toe  -CT showing left breast contusion  .  Would evaluation    #.  Bilateral pulmonary nodules  -Pulmonary follow-up    #.  ESRD-DD MWF  -Nephrology evaluation in a.m.    #.  Type 2 diabetes mellitus  Start on sliding scale insulin before meals and at bedtime    #.  Hypertension  Continue Coreg    #.  Hyperlipidemia  Continue Lipitor 40 mg daily    #. DVT prophylaxis: Continue heparin      Code Status: Full code  Next of Kin: Husband        History of Presenting Illness:   [name] is a 66 y.o. female history of COPD, type 2 diabetes mellitus, hypertension, hyperlipidemia, COPD, ESRD-DD MWF presented to the emergency room after a fall.  Patient having left chest pain contusion of the belly and right foot.  Work-up in the ER was negative for any fractures.  CT head was negative.  Patient states she was having a shower when she slipped out.  Patient drowsy falls asleep while conversation.  However able to communicate.  Denies any fevers or chills.  Patient brought in for altered mental status by her husband.  Patient is asking for pain medications.  Unclear if this was a hypoglycemic episode at home.  Admitted for further work-up and management.    Past Medical History:     Past Medical History:   Diagnosis Date    Chronic obstructive pulmonary  disease     Diabetes mellitus     Hyperlipidemia     Hypertension     Sleep apnea 2018     Available old records reviewed,    Past Surgical History:     Past Surgical History:   Procedure Laterality Date    EGD, BIOPSY N/A 05/14/2019    Procedure: EGD, BIOPSY;  Surgeon: Ronie Spies, MD;  Location: [facility];  Service: Gastroenterology;  Laterality: N/A;    EXPLORATORY LAPAROTOMY N/A 05/27/2019    Procedure: EXPLORATORY LAPAROTOMY;  Surgeon: Herbert Moors., MD;  Location: [facility];  Service: General;  Laterality: N/A;    JOINT REPLACEMENT      shoulder    JOINT REPLACEMENT      knee    OVARY SURGERY Left     Removal    REMOVAL, FOREIGN BODY N/A 05/27/2019    Procedure: REMOVAL, PERITONEAL DIALYSIS CATHETER;  Surgeon: Herbert Moors., MD;  Location: [facility];  Service: General;  Laterality: N/A;    TUNNELED CATH CHECK/CHANGE ([facility]) N/A 07/03/2019    Procedure: TUNNELED CATH CHECK/CHANGE;  Surgeon: Maureen Ralphs, MD;  Location: [facility];  Service: Interventional Radiology;  Laterality: N/A;  TUNNELED CATH PLACEMENT [facility]) Bilateral 05/23/2019    Procedure: TUNNELED CATH PLACEMENT;  Surgeon: Jacques Earthly, MD;  Location: [facility];  Service: Interventional Radiology;  Laterality: Bilateral;    TUNNELED CATH PLACEMENT ([facility]) Right 07/31/2019    Procedure: TUNNELED CATH PLACEMENT;  Surgeon: Jacques Earthly, MD;  Location: [facility];  Service: Interventional Radiology;  Laterality: Right;       Family History:     Family History   Problem Relation Age of Onset    Diabetes Mother     Hypertension Mother     Diabetes Father     Hypertension Father     Diabetes Sister     Hypertension Sister     Diabetes Brother     Hypertension Brother     Diabetes Paternal Aunt     Diabetes Paternal Uncle      Reviewed    Social History:     Social History     Socioeconomic History    Marital status: Legally Separated     Spouse name: Not on file    Number of children: Not on file    Years  of education: Not on file    Highest education level: Not on file   Occupational History    Not on file   Tobacco Use    Smoking status: Never    Smokeless tobacco: Never   Vaping Use    Vaping status: Never Used   Substance and Sexual Activity    Alcohol use: Never    Drug use: Never    Sexual activity: Not Currently   Other Topics Concern    Not on file   Social History Narrative    Not on file     Social Determinants of Health     Financial Resource Strain: Not on file   Food Insecurity: Not on file   Transportation Needs: Not on file   Physical Activity: Not on file   Stress: Not on file   Social Connections: Not on file   Intimate Partner Violence: Not on file   Housing Stability: Not on file     Reviewed    Allergies:     Allergies   Allergen Reactions    Lisinopril Hives    Metformin Hives       Medications:     Medications Prior to Admission   Medication Sig Dispense Refill Last Dose    acetaminophen (TYLENOL) 500 MG tablet Take 500 mg by mouth every 6 (six) hours as needed for Pain       albuterol sulfate HFA (PROVENTIL) 108 (90 Base) MCG/ACT inhaler Inhale 2 puffs into the lungs every 6 (six) hours as needed for Wheezing or Shortness of Breath       amLODIPine (NORVASC) 10 MG tablet Take 1 tablet by mouth daily       aspirin 81 MG chewable tablet Chew 81 mg by mouth daily       atorvastatin (LIPITOR) 40 MG tablet Take 1 tablet (40 mg) by mouth daily 30 tablet 0     carvedilol (COREG) 25 MG tablet Take 1 tablet (25 mg) by mouth every 12 (twelve) hours 60 tablet 0     dextromethorphan-guaiFENesin (MUCINEX DM) 30-600 MG per 12 hr tablet Take 1 tablet by mouth every 12 (twelve) hours 14 tablet 1     gabapentin (NEURONTIN) 100 MG capsule Take 1 capsule (100 mg) by mouth every 8 (eight) hours 30 capsule 0     insulin  aspart (NovoLOG) 100 UNIT/ML injection Inject 15 Units into the skin 3 (three) times daily before meals       Insulin Degludec (TRESIBA SC) Inject 35 Unit into the skin        lactobacillus/streptococcus (RISAQUAD) Cap Take 1 capsule by mouth daily 30 capsule 0     mupirocin (BACTROBAN) 2 % ointment Apply topically daily USe BID for 2 Weeks 15 g 0     oxyCODONE-acetaminophen (PERCOCET) 5-325 MG per tablet Take 1 tablet by mouth every 4 (four) hours as needed for Pain 15 tablet 0     pantoprazole (PROTONIX) 40 MG tablet Take 40 mg by mouth daily       petrolatum (AQUAPHOR) ointment Apply topically every 12 (twelve) hours 30 g 0     sevelamer (RENVELA) 800 MG tablet Take 800 mg by mouth 2 (two) times daily with meals              Review of Systems:     General: No fevers or chills, no fatigue, no acute distress  HEENT: Negative for headaches, no blurred vision, no throat pain  Respiratory: Denies cough, no phlegm, no shortness of breath  Cardiovascular: Denies chest pain, no angina  Gastrointestinal: No abdominal pain, no nausea, no vomiting  Neuro: No headaches, denies weakness  Genitourinary: No dysuria, no flank pain  Musculoskeletal: Injuries to left chest abdomen and right great toe          Physical Exam:   BP 150/76   Pulse 64   Temp 97.2 F (36.2 C) (Oral)   Resp 14   Ht 1.575 m (5\' 2" )   Wt 110 kg (242 lb 8.1 oz)   LMP  (LMP Unknown) Comment: post menopausal  SpO2 100%   BMI 44.35 kg/m       Intake and Output Summary (Last 24 hours) at Date Time  No intake or output data in the 24 hours ending 04/23/21 2133    General: no acute distress  HEENT: Normocephalic, Pupils round and reactive, EOMI, no pallor, no cyanosis, no icterus  Neck: Supple  Cardiovascular: regular rate and rhythm, normal S1 and S2, no murmurs  Lungs:  Bilateral breath sounds anterior chest  Abdomen: soft, pes, non-tender, no rigidity,  normoactive bowel sounds  Genitourinary: No costovertebral tenderness.   Neuro: Pink, however wakes up answers appropriately and falls asleep while conversing  Extremities: No edema, no restricted movements, no calf tenderness.          Labs:   Labs were personally  reviewed    Results       Procedure Component Value Units Date/Time    Glucose Whole Blood - POCT [[id number]]  (Abnormal) Collected: 04/23/21 2030     Updated: 04/23/21 2033     Whole Blood Glucose POCT 233 mg/dL     Arterial Blood Gas (ABG) [[id number]]  (Abnormal) Collected: 04/23/21 1841    Specimen: Blood, Arterial Updated: 04/23/21 1903     pH, Arterial 7.359     pCO2, Arterial 40.5 mmhg      pO2, Arterial 113.0 mmhg      HCO3, Arterial 22.3 mEq/L      Arterial Total CO2 46.0 mEq/L      Base Excess, Arterial -2.4 mEq/L      O2 Sat, Arterial 97.7 %      ABG CollectionSite Right Radl     Allen's Test Yes     Temperature 37.0     O2 Delivery Nasal Cannula  O2 Flow 2.0 L/min     Glucose Whole Blood - POCT [[id number]]  (Abnormal) Collected: 04/23/21 1547     Updated: 04/23/21 1549     Whole Blood Glucose POCT 332 mg/dL     Glucose Whole Blood - POCT [[id number]]  (Abnormal) Collected: 04/23/21 1437     Updated: 04/23/21 1440     Whole Blood Glucose POCT 257 mg/dL     COVID-19 (SARS-CoV-2) only ([facility]) asymptomatic admission [[id number]] Collected: 04/23/21 1352    Specimen: Nasopharyngeal Updated: 04/23/21 1429     Purpose of COVID testing Screening     SARS-CoV-2 Specimen Source Nasal Swab     SARS CoV 2 Overall Result Not Detected    Narrative:      o Collect and clearly label specimen type:  o PREFERRED-Upper respiratory specimen: One Nasal Swab in  Transport Media.  o Hand deliver to laboratory ASAP  Indication for testing->Extended care facility admission to  semi private room  Screening    Comprehensive metabolic panel [[id number]]  (Abnormal) Collected: 04/23/21 1352    Specimen: Blood Updated: 04/23/21 1416     Glucose 320 mg/dL      BUN 57.0 mg/dL      Creatinine 7.6 mg/dL      Sodium 136 mEq/L      Potassium 5.8 mEq/L      Chloride 99 mEq/L      CO2 23 mEq/L      Calcium 8.4 mg/dL      Protein, Total 6.5 g/dL      Albumin 2.7 g/dL      AST (SGOT) 20 U/L      ALT 31 U/L      Alkaline Phosphatase 279 U/L       Bilirubin, Total 0.6 mg/dL      Globulin 3.8 g/dL      Albumin/Globulin Ratio 0.7     Anion Gap 14.0    Ethanol (Alcohol) Level [[id number]] Collected: 04/23/21 1352    Specimen: Blood Updated: 04/23/21 1416     Alcohol NONE DETECTED mg/dL     GFR [[id number]] Collected: 04/23/21 1352     Updated: 04/23/21 1416     EGFR 6.5    CBC and differential [[id number]]  (Abnormal) Collected: 04/23/21 1352    Specimen: Blood Updated: 04/23/21 1409     WBC 6.41 x10 3/uL      Hgb 11.1 g/dL      Hematocrit 36.2 %      Platelets 103 x10 3/uL      RBC 3.85 x10 6/uL      MCV 94.0 fL      MCH 28.8 pg      MCHC 30.7 g/dL      RDW 14 %      MPV 12.2 fL      Instrument Absolute Neutrophil Count 4.52 x10 3/uL      Neutrophils 70.5 %      Lymphocytes Automated 16.4 %      Monocytes 11.1 %      Eosinophils Automated 0.5 %      Basophils Automated 1.2 %      Immature Granulocytes 0.3 %      Nucleated RBC 0.0 /100 WBC      Neutrophils Absolute 4.52 x10 3/uL      Lymphocytes Absolute Automated 1.05 x10 3/uL      Monocytes Absolute Automated 0.71 x10 3/uL      Eosinophils Absolute Automated 0.03 x10 3/uL  Basophils Absolute Automated 0.08 x10 3/uL      Immature Granulocytes Absolute 0.02 x10 3/uL      Absolute NRBC 0.00 x10 3/uL     Glucose Whole Blood - POCT [[id number]]  (Abnormal) Collected: 04/23/21 1352     Updated: 04/23/21 1354     Whole Blood Glucose POCT 275 mg/dL             Radiology Results (24 Hour)       Procedure Component Value Units Date/Time    CT Hip Right WO Contrast [[id number]] Collected: 04/23/21 1738    Order Status: Completed Updated: 04/23/21 1756    Narrative:      Exam:CT right hip without contrast    INDICATION:Ground-level fall    COMPARISON:None    TECHNIQUE:Axial CT images of the right hip were performed without IV  contrast.  Multiplanar reconstructions obtained.    Note that CT scanning at this site  utilizes multiple dose reduction  techniques including automatic exposure control, adjustment of the  MAA  and/or KVP according to patient's size and use of iterative  reconstruction technique    FINDINGS:Osseous structures: There is mild to moderate osteopenia. The  right SI joint is intact. There are no acute fractures. Right femoral  head is seated within the acetabulum. Pubic symphysis is intact with  chondrocalcinosis suggesting CPPD.    Soft tissues: Calcified uterine fibroid seen. Urinary bladder is  unremarkable. There is anasarca. Severe calcific arteriosclerosis.      Impression:      No acute fractures or subluxation right hip.    CPPD.    Anasarca.    Macon Large, MD  04/23/2021 5:54 PM    CT Abdomen Pelvis WO Contrast [[id number]] Collected: 04/23/21 1723    Order Status: Completed Updated: 04/23/21 1753    Narrative:      Exam:CT abdomen and pelvis without IV contrast    History: Abdominal trauma, blunt       COMPARISON: 04/13/2021    Technique: Thin section CT scan of the abdomen and pelvis without IV  contrast. Multiplanar reconstructions obtained.    Note: Note that CT scanning at this site  utilizes multiple dose  reduction techniques including automatic exposure control, adjustment of  the MAA and/or KVP according to patient's size and use of iterative  reconstruction technique    ABDOMEN FINDINGS:     LUNG BASES: Enlarged left breast with fat stranding. Please see CT chest  report dated 04/23/2021.    LIVER: Unremarkable    GALLBLADDER/Biliary Tree: Small gallstones seen in the neck of the  gallbladder measuring 4 mm. No intra or extrahepatic bile duct  dilatation.    PANCREAS: Unremarkable    SPLEEN: Unremarkable    ADRENALS: Unremarkable    KIDNEYS: Kidneys are atrophic.    VASCULATURE: Severe calcific arteriosclerosis present.    LYMPH NODES: No enlarged lymph nodes.    BOWEL: No bowl wall thickening or dilatation. Colonic diverticulosis.  There is diastases the abdominis rectus with outpouching of small bowel  loops without obstruction.    APPENDIX:No evidence of acute  appendicitis.    PERITONEAL CAVITY:There is a trace of free fluid without free air.    PELVIC FINDINGS:    BLADDER: Unremarkable    REPRODUCTIVE ORGANS: Numerous calcified uterine fibroid seen.    ABDOMINAL WALL: There is anasarca. Small dot of gas is seen in the lower  anterior abdominal wall ventrally ingesting injection site. There is an  area of ventral abdominal wall  deformity and soft tissue thickening in  the left paramedian measuring 2.1 cm suggesting scarring. There is 1 cm  area of low attenuation in this area of soft tissue thickening which may  represent residual fluid. No soft tissue gas seen.    BONES: No acute or aggressive osseous findings.    OTHER:      Impression:       No solid or viscus organ injury. No free free air. Trace of  free fluid.    Anasarca.    Scarring in the left paramedian abdominal wall with a 1 cm area of low  attenuation in the subcutaneous tissues which may represent residual  fluid. A smaller than on prior exam.    Abnormal appearing left breast. Please see CT chest report dated  04/23/2021.    Cholelithiasis. Small hiatal hernia    Diastases abdominis rectus.Macon Large, MD  04/23/2021 5:51 PM    CT Chest without Contrast [[id number]] Collected: 04/23/21 1722    Order Status: Completed Updated: 04/23/21 1740    Narrative:      Examination:  CT of the chest was performed without intravenous contrast. Multiplanar  reconstructions obtained.    Clinical History:Fall yesterday with right-sided pain    Comparison:None    Note that CT scanning at this site  utilizes multiple dose reduction  techniques including automatic exposure control, adjustment of the MAA  and/or KVP according to patient's size and use of iterative  reconstruction technique    Findings:    Nodes: There is an enlarged left hilar lymph node measuring 12 mm. Is an  enlarged precarinal lymph node measuring 12 mm. There are enlarged  axillary and subpectoral lymph nodes on the left with a  subpectoral  lymph nodes measuring 14 mm and left breast lymph nodes measuring up to  15 mm.    pleura: There are no pleural effusion.    Mediastinum and vasculature: There is a small hiatal hernia.    Heart: Cardiac size is normal. There is no pericardial effusion.  There  is severe calcific coronary disease.    Lungs:There is a 4 mm subpleural nodule left lower lobe image 62. There  is a 5 mm nodule in the right lower lobe image 63..There are groundglass  opacities and intralobular septal thickening suggesting mild pulmonary  edema. No pneumothorax.    Airways: Unremarkable.    Chest wall/Soft Tissues: There is enlargement of the left breast. There  are skin thickening. There is extensive stranding of the soft tissues of  the left breast. Differential diagnosis includes left breast and chest  wall contusion from trauma versus left breast neoplasm versus mastitis.  I would recommend workup at a dedicated mammography Center with  mammograms and ultrasound.    Upper abdomen: Small gallstones seen in the neck of the gallbladder  measuring 6 mm. Severe calcific arteriosclerosis present. Visualized  solid organs are otherwise unremarkable.    Bones: No acute or aggressive osseous findings.      Impression:          Groundglass opacities and interlobular septal thickening suggesting mild  to moderate pulmonary edema.    Bilateral pulmonary nodules. Follow-up as per Fleischner Society  protocol.    Enlarged left breast with skin thickening and extensive fat stranding  with subpectoral, left breast and mediastinal adenopathy. Differential  diagnosis includes left breast contusion from trauma versus neoplasm  versus mastitis. I would recommend further workup of these findings at a  dedicated [facility] with mammograms and ultrasound. Findings  were discussed with Dr. Hartford Poli 5:35 PM 05/23/2021.    Enlarged mediastinal and hilar lymph nodes. Follow-up to exclude any  myeloproliferative or neoplastic  process.    Small hiatal hernia. Cholelithiasis.    Fleischner Society pulmonary nodule guidelines (revised 2017):    Multiple solid nodules less than 6 mm:  Low risk for lung cancer: No follow-up.  High risk for lung cancer: Optional chest CT in12 months.            Macon Large, MD  04/23/2021 5:38 PM    XR Pelvis Limited 1 Or 2 Views [[id number]] Collected: 04/23/21 1552    Order Status: Completed Updated: 04/23/21 1556    Narrative:      INDICATION: Trauma. Pain.    TECHNIQUE: One view.    FINDINGS: No definite fracture is demonstrated. The study is however  significantly limited by underpenetration. Additional radiographs or CT  are recommended. There is vascular calcification. There are calcified  fibroids in the central pelvis.      Impression:       No definite fracture is demonstrated. The study is however  significantly limited by underpenetration. Additional radiographs or CT  are recommended.     Steward Drone, MD  04/23/2021 3:54 PM    Ribs Right/PA Chest [[id number]] Collected: 04/23/21 1536    Order Status: Completed Updated: 04/23/21 1539    Narrative:      Indication: Fall, right body pain.    FINDINGS: Right rib series. 3 views. Osteopenia. Difficult to penetrate  the soft tissues. No fracture seen. No pleural effusion or pneumothorax.      Impression:       Limited study due to osteopenia and soft tissue density. No  fracture seen.    Jennell Corner, MD  04/23/2021 3:37 PM    CT Head without Contrast [[id number]] Collected: 04/23/21 1425    Order Status: Completed Updated: 04/23/21 1429    Narrative:      INDICATION: Head trauma.    TECHNIQUE: Multiple transaxial images of the head were obtained. No  intravenous contrast was administered. A combination of automatic  exposure control, adjustment of the mA and or KV according to patient  size, and/or use of iterative reconstruction technique was utilized.    FINDINGS: No intracranial mass lesion, hemorrhage, or territorial  infarction is demonstrated.  Ventricular system and subarachnoid spaces  are within normal limits. No regions of abnormal density are noted  within the brain parenchyma. The visualized portions of the sinuses and  mastoid air cells are clear.      Impression:       Unremarkable noncontrast CT of the brain.    Steward Drone, MD  04/23/2021 2:27 PM             Microbiology Results (last 15 days)       Procedure Component Value Units Date/Time    COVID-19 (SARS-CoV-2) only ([facility]) asymptomatic admission [[id number]] Collected: 04/23/21 1352    Order Status: Completed Specimen: Nasopharyngeal Updated: 04/23/21 1429     Purpose of COVID testing Screening     SARS-CoV-2 Specimen Source Nasal Swab     SARS CoV 2 Overall Result Not Detected     Comment: __________________________________________________  -A result of "Detected" indicates POSITIVE for the    presence of SARS CoV-2 RNA  -A result of "Not Detected" indicates NEGATIVE for the    presence of SARS CoV-2 RNA  __________________________________________________________  Test performed using the Roche cobas [facility] SARS-CoV-2 assay. This assay is  only for use under the Food and Drug Administration's Emergency Use  Authorization. This is a real-time RT-PCR assay for the qualitative  detection of SARS-CoV-2 RNA. Viral nucleic acids may persist in vivo,  independent of viability. Detection of viral nucleic acid does not imply the  presence of infectious virus, or that virus nucleic acid is the cause of  clinical symptoms. Negative results do not preclude SARS-CoV-2 infection and  should not be used as the sole basis for diagnosis, treatment or other  patient management decisions. Negative results must be combined with  clinical observations, patient history, and/or epidemiological information.  Invalid results may be due to inhibiting substances in the specimen and  recollection should occur. Please see Fact Sheets for patients and  providers  located:  https://www.[employer].com/         Narrative:      o Collect and clearly label specimen type:  o PREFERRED-Upper respiratory specimen: One Nasal Swab in  Transport Media.  o Hand deliver to laboratory ASAP  Indication for testing->Extended care facility admission to  semi private room  Screening    Culture + Gram Lenise Arena, Wound [[id number]] Collected: 04/14/21 1609    Order Status: Completed Specimen: Wound Updated: 04/17/21 2031    Narrative:      [id number] called Micro Results of MRSA. Read back [id number], by [id number] on  04/17/2021 at 20:30  ESwab  ORDER#: [id number]                                    ORDERED BY: CHOUDHARY, IMTI  SOURCE: Wound Abdominal wall wound                   COLLECTED:  04/14/21 16:09  ANTIBIOTICS AT COLL.:                                RECEIVED :  04/14/21 22:37  ORDER ENTRY COMMENTS:  ESwab  [id number] called Micro Results of MRSA. Read back [id number], by [phone] on 04/17/2021 at 20:30  Stain, Gram                                FINAL       04/15/21 00:03  04/15/21   No Squamous epithelial cells seen             No WBCs or organisms seen  Culture and Gram Stain, Aerobic, Wound     FINAL       04/17/21 20:31   +  04/15/21   Heavy growth of Staphylococcus aureus               Refer to susceptibilities on culture #[id number]        Culture + Gram Lenise Arena Wound [[id number]] Collected: 04/14/21 1201    Order Status: Completed Specimen: Wound Updated: 04/17/21 1648    Narrative:      Oren Binet Culturette  ORDER#: [id number]                                    ORDERED BY: Sharlene Motts  SOURCE: Wound abdomen  COLLECTED:  04/14/21 12:01  ANTIBIOTICS AT COLL.:                                RECEIVED :  04/14/21 15:22  ORDER ENTRY COMMENTS:  Blue Culturette  Stain, Gram                                FINAL       04/14/21 16:55   +  04/14/21   No Squamous epithelial cells seen             Many WBCs             Moderate Gram positive  cocci  Culture and Gram Stain, Aerobic, Wound     FINAL       04/17/21 16:48   +  04/17/21   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      _____________________________________________________________________________                                      MRSA        ANTIBIOTICS                     MIC  INTRP      _____________________________________________________________________________  Clindamycin                    <=0.5   S        Daptomycin                      <=1    S        Erythromycin                    >4     R        Gentamicin                      <=2    S        Levofloxacin                     4     I        Linezolid                        2     S        Oxacillin                       >2     R  D1    Penicillin                      >1     R        Rifampin                       <=0.5   S        Tetracycline                   <=0.5   S        Trimethoprim/Sulfamethoxazole <=0.5/9  S  Vancomycin                       1     S          -----DRUG COMMENTS----------    D1:  Oxacillin sensitivity predicts sensitivity to cephalosporins.         [place] [facility] 2016  _____________________________________________________________________________            S=SUSCEPTIBLE     I=INTERMEDIATE     R=RESISTANT       N/S=NON-SUSCEPTIBLE     SDD=SUSCEPTIBLE-DOSE DEPENDENT  _____________________________________________________________________________      COVID-19 (SARS-CoV-2) and Influenza A/B, NAA ([facility] Rapid)- Admission [[id number]] Collected: 04/13/21 1529    Order Status: Completed Specimen: Culturette from Nasopharyngeal Updated: 04/13/21 1615     Purpose of COVID testing Diagnostic -[facility]     SARS-CoV-2 Specimen Source Nasal Swab     SARS CoV 2 Overall Result Not Detected     Comment: __________________________________________________  -A result of "Detected" indicates POSITIVE for the    presence of SARS CoV-2 RNA  -A result of "Not Detected" indicates NEGATIVE for the     presence of SARS CoV-2 RNA  __________________________________________________________  Test performed using the Roche cobas Liat SARS-CoV-2 assay. This assay is  only for use under the Food and Drug Administration's Emergency Use  Authorization. This is a real-time RT-PCR assay for the qualitative  detection of SARS-CoV-2 RNA. Viral nucleic acids may persist in vivo,  independent of viability. Detection of viral nucleic acid does not imply the  presence of infectious virus, or that virus nucleic acid is the cause of  clinical symptoms. Negative results do not preclude SARS-CoV-2 infection and  should not be used as the sole basis for diagnosis, treatment or other  patient management decisions. Negative results must be combined with  clinical observations, patient history, and/or epidemiological information.  Invalid results may be due to inhibiting substances in the specimen and  recollection should occur. Please see Fact Sheets for patients and providers  located:  https://www.[facility].com/          Influenza A Not Detected     Influenza B Not Detected     Comment: Test performed using the Roche cobas Liat SARS-CoV-2 & Influenza A/B assay.  This assay is only for use under the Food and Drug Administration's  Emergency Use Authorization. This is a multiplex real-time RT-PCR assay  intended for the simultaneous in vitro qualitative detection and  differentiation of SARS-CoV-2, influenza A, and influenza B virus RNA. Viral  nucleic acids may persist in vivo, independent of viability. Detection of  viral nucleic acid does not imply the presence of infectious virus, or that  virus nucleic acid is the cause of clinical symptoms. Negative results do  not preclude SARS-CoV-2, influenza A, and/or influenza B infection and  should not be used as the sole basis for diagnosis, treatment or other  patient management decisions. Negative results must be combined with  clinical observations, patient history,  and/or epidemiological information.  Invalid results may be due to inhibiting substances in the specimen and  recollection should occur. Please see Fact Sheets for patients and providers  located: http://[facility].info/.         Narrative:      o Collect and clearly label specimen type:  o PREFERRED-Upper respiratory specimen: One Nasal Swab in  Transport Media.  o Hand deliver to laboratory ASAP  Diagnostic -[facility]    Culture Blood Aerobic and Anaerobic [  [id number]] Collected: 04/13/21 1529    Order Status: Completed Specimen: Arm from Blood, Venipuncture Updated: 04/18/21 2221    Narrative:      ORDER#: [id number]                                    ORDERED BY: MANOOCHEHRI, OM  SOURCE: Blood, Venipuncture arm                      COLLECTED:  04/13/21 15:29  ANTIBIOTICS AT COLL.:                                RECEIVED :  04/13/21 20:03  Culture Blood Aerobic and Anaerobic        FINAL       04/18/21 22:21  04/18/21   No growth after 5 days of incubation.      Culture Blood Aerobic and Anaerobic [[id number]] Collected: 04/13/21 1529    Order Status: Completed Specimen: Arm from Blood, Venipuncture Updated: 04/18/21 2221    Narrative:      ORDER#: [id number]                                    ORDERED BY: MANOOCHEHRI, OM  SOURCE: Blood, Venipuncture arm                      COLLECTED:  04/13/21 15:29  ANTIBIOTICS AT COLL.:                                RECEIVED :  04/13/21 20:03  Culture Blood Aerobic and Anaerobic        FINAL       04/18/21 22:21  04/18/21   No growth after 5 days of incubation.              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 dietary iron intake    Sideropenic dysphagia    Peritoneal dialysis catheter dysfunction, initial encounter    Peritonitis    Upper GI bleed    ESRD (end stage renal disease) on dialysis    Generalized abdominal pain    Thrombocytopenia    h/o orthostatic hypotension    Type  2 diabetes mellitus, with long-term current use of insulin    GERD (gastroesophageal reflux disease)    Bowel incontinence    Right sided numbness    Uncontrolled type 2 diabetes mellitus with hyperglycemia    Hypertension    Hyperlipidemia    History of COPD    Gastritis with hemorrhage    History of anemia due to chronic kidney disease    Open wound, abdominal wall, lateral, subsequent encounter    Gout    Dizziness    Fall    History of CVA (cerebrovascular accident)    Pneumonia due to COVID-19 virus    Diarrhea    Hypertensive urgency    Shortness of breath    ESRD (end stage renal disease)    Hyperkalemia    Pulmonary edema    Hypervolemia    Elevated troponin  Chest pain    Nausea vomiting and diarrhea    Breast pain, left    Syncope    Hypertensive emergency    Volume overload    Altered mental status                              Signed by: Debria Garret, MD  [id number], Cathlean Marseilles, MD  Debria Garret, MD      *This note was generated by the [employer] EMR system/ Dragon speech recognition. It  may contain inherent errors or omissions not intended by the user. Grammatical errors, random word insertions, deletions, pronoun errors and incomplete sentences are occasional consequences of this technology due to software limitations. Not all errors are caught or corrected. If there are questions about the content of this note or information contained within the body of this dictation they should be addressed directly with the author for clarification.  *The notes reflects the date of service. Notes were dictated/written at a different time due to covid situation, as the rounding on the patient does not match the time when the patient was actually seen.

## 2021-04-23 NOTE — Significant Event (Signed)
Responded to a rapid response called overhead. Patient had sudden onset of altered mentation and hypoxia -per nursing staff O2 saturation was in the 50's.  She was placed on a non-re breather mask, on verbal stimuli she was alert and oriented to self and place.  Prior to the episode she had taken oral medications concerning for aspiration, no documented history of OSA.  Given hypoxia will repeat ABG, obtain a CXR, order an ammonia level, provide IV steroids, duonebs and give a dose of IV antibiotics.  Will attempt to wean non-re breather to nasal cannula and continue to monitor overnight.  40 minutes of critical care time was spent with this patient in coordination and care.

## 2021-04-23 NOTE — ED Provider Notes (Signed)
EMERGENCY DEPARTMENT HISTORY AND PHYSICAL EXAM     None        Date: 04/23/2021  Patient Name: [name]  Attending Physician: Hartford Poli, MD    History of Presenting Illness     Unable to obtain full history and physical secondary to patient's altered mental status    History Provided By: Patient's Husband and EMS    Chief Complaint:  Chief Complaint   Patient presents with    Altered Mental Status       Additional History: [name] is a 66 y.o. female presenting to the ED with altered mental status. Patient had fall yesterday with head trauma. Was found today by husband altered, confused, off her baseline. She has ESRD history, getting dialysis as normal, no missed sessions. Patient notes right sided chest/hip pain after fall. Unsure about head trauma. Otherwise, patient not answering questions appropriately. She is sleeping, rouses somewhat with stimulation, but is lethargic on waking, confused, poor memory. Unsure what led to fall, head trauma.     PCP: Horton Marshall, MD  SPECIALISTS:    Current Facility-Administered Medications   Medication Dose Route Frequency Provider Last Rate Last Admin    acetaminophen (TYLENOL) tablet 650 mg  650 mg Oral Q6H PRN Addanki, Venkateswer R, MD        Or    acetaminophen (TYLENOL) suppository 650 mg  650 mg Rectal Q6H PRN Addanki, Venkateswer R, MD        albuterol-ipratropium (DUO-NEB) 2.5-0.5(3) mg/3 mL nebulizer 3 mL  3 mL Nebulization Q6H [place] Haynie, Stephani Police, MD   3 mL at 04/24/21 0116    amLODIPine (NORVASC) tablet 10 mg  10 mg Oral Daily Addanki, Venkateswer R, MD   10 mg at 04/23/21 2201    atorvastatin (LIPITOR) tablet 40 mg  40 mg Oral Daily Addanki, Venkateswer R, MD   40 mg at 04/23/21 2201    azithromycin (ZITHROMAX) 500 mg in sodium chloride 0.9 % 250 mL IVPB  500 mg Intravenous Q24H Wilfred Lacy, MD 250 mL/hr at 04/23/21 2353 500 mg at 04/23/21 2353    carvedilol (COREG) tablet 25 mg  25 mg Oral Q12H Wayne Addanki, Venkateswer R,  MD   25 mg at 04/23/21 2201    cefTRIAXone (ROCEPHIN) 1 g in sodium chloride 0.9 % 100 mL IVPB mini-bag plus  1 g Intravenous Q24H Wilfred Lacy, MD 200 mL/hr at 04/23/21 2304 1 g at 04/23/21 2304    dextrose (GLUCOSE) 40 % oral gel 15 g of glucose  15 g of glucose Oral PRN Addanki, Venkateswer R, MD        Or    dextrose (D10W) 10% bolus 125 mL  12.5 g Intravenous PRN Addanki, Venkateswer R, MD        Or    dextrose 50 % bolus 12.5 g  12.5 g Intravenous PRN Addanki, Venkateswer R, MD        Or    glucagon (rDNA) (GLUCAGEN) injection 1 mg  1 mg Intramuscular PRN Addanki, Venkateswer R, MD        dextrose (GLUCOSE) 40 % oral gel 15 g of glucose  15 g of glucose Oral PRN Addanki, Venkateswer R, MD        Or    dextrose (D10W) 10% bolus 125 mL  12.5 g Intravenous PRN Addanki, Venkateswer R, MD        Or    dextrose 50 % bolus 12.5 g  12.5 g  Intravenous PRN Addanki, Venkateswer R, MD        Or    glucagon (rDNA) (GLUCAGEN) injection 1 mg  1 mg Intramuscular PRN Addanki, Venkateswer R, MD        heparin (porcine) injection 5,000 Units  5,000 Units Subcutaneous Q12H [facility] Addanki, Venkateswer R, MD   5,000 Units at 04/23/21 2201    insulin lispro injection 1-3 Units  1-3 Units Subcutaneous QHS Addanki, Venkateswer R, MD   1 Units at 04/23/21 2207    insulin lispro injection 1-5 Units  1-5 Units Subcutaneous TID AC Addanki, Venkateswer R, MD        lactulose (CHRONULAC) 10 GM/15ML solution 10 g  10 g Oral Daily PRN Addanki, Venkateswer R, MD        magnesium sulfate 1g in dextrose 5% 133mL IVPB (premix)  1 g Intravenous PRN Addanki, Venkateswer R, MD        melatonin tablet 3 mg  3 mg Oral QHS PRN Addanki, Venkateswer R, MD        methylPREDNISolone sodium succinate (Solu-MEDROL) injection 40 mg  40 mg Intravenous Q8H Wilfred Lacy, MD   40 mg at 04/23/21 2303    naloxone (NARCAN) injection 0.2 mg  0.2 mg Intravenous PRN Addanki, Venkateswer R, MD        ondansetron (ZOFRAN-ODT) disintegrating tablet 4 mg  4 mg  Oral Q6H PRN Addanki, Venkateswer R, MD        Or    ondansetron (ZOFRAN) injection 4 mg  4 mg Intravenous Q6H PRN Addanki, Venkateswer R, MD        oxyCODONE-acetaminophen (PERCOCET) 5-325 MG per tablet 1 tablet  1 tablet Oral Q4H PRN Barbaraann Faster, MD   1 tablet at 04/23/21 2201    polyethylene glycol (MIRALAX) packet 17 g  17 g Oral Daily PRN Addanki, Venkateswer R, MD        senna-docusate (PERICOLACE) 8.6-50 MG per tablet 2 tablet  2 tablet Oral QHS Addanki, Venkateswer R, MD   2 tablet at 04/23/21 2201    sevelamer (RENVELA) tablet 800 mg  800 mg Oral BID Meals Addanki, Venkateswer R, MD   800 mg at 04/23/21 2209       Past History     Past Medical History:  Past Medical History:   Diagnosis Date    Chronic obstructive pulmonary disease     Diabetes mellitus     Hyperlipidemia     Hypertension     Sleep apnea 2018       Past Surgical History:  Past Surgical History:   Procedure Laterality Date    EGD, BIOPSY N/A 05/14/2019    Procedure: EGD, BIOPSY;  Surgeon: Ronie Spies, MD;  Location: [facility];  Service: Gastroenterology;  Laterality: N/A;    EXPLORATORY LAPAROTOMY N/A 05/27/2019    Procedure: EXPLORATORY LAPAROTOMY;  Surgeon: Herbert Moors., MD;  Location: [facility];  Service: General;  Laterality: N/A;    JOINT REPLACEMENT      shoulder    JOINT REPLACEMENT      knee    OVARY SURGERY Left     Removal    REMOVAL, FOREIGN BODY N/A 05/27/2019    Procedure: REMOVAL, PERITONEAL DIALYSIS CATHETER;  Surgeon: Herbert Moors., MD;  Location: [facility];  Service: General;  Laterality: N/A;    TUNNELED CATH CHECK/CHANGE (PERMCATH) N/A 07/03/2019    Procedure: TUNNELED CATH CHECK/CHANGE;  Surgeon: Maureen Ralphs, MD;  Location: [facility];  Service: Interventional Radiology;  Laterality: N/A;    TUNNELED CATH PLACEMENT (PERMCATH) Bilateral 05/23/2019    Procedure: TUNNELED CATH PLACEMENT;  Surgeon: Jacques Earthly, MD;  Location: [facility];  Service: Interventional Radiology;   Laterality: Bilateral;    TUNNELED CATH PLACEMENT (PERMCATH) Right 07/31/2019    Procedure: TUNNELED CATH PLACEMENT;  Surgeon: Jacques Earthly, MD;  Location: [facility];  Service: Interventional Radiology;  Laterality: Right;       Family History:  Family History   Problem Relation Age of Onset    Diabetes Mother     Hypertension Mother     Diabetes Father     Hypertension Father     Diabetes Sister     Hypertension Sister     Diabetes Brother     Hypertension Brother     Diabetes Paternal Aunt     Diabetes Paternal Uncle        Social History:  Social History     Tobacco Use    Smoking status: Never    Smokeless tobacco: Never   Vaping Use    Vaping status: Never Used   Substance Use Topics    Alcohol use: Never    Drug use: Never       Allergies:  Allergies   Allergen Reactions    Lisinopril Hives    Metformin Hives       Review of Systems     Review of Systems   Unable to perform ROS: Mental status change       Physical Exam     Vitals:    04/23/21 2231 04/23/21 2317 04/24/21 0044 04/24/21 0116   BP: 150/66 147/66     Pulse: 65 62 62    Resp: 22 (!) 8 12    Temp:  97.7 F (36.5 C)     TempSrc:  Oral     SpO2: 100% 96% 98% 98%   Weight:       Height:           Constitutional: Sleeping, when woken up, patient yelling in pain  HENT: Atraumatic, normocephalic, b/l external ears normal  Eyes: EOMI, no scleral icterus b/l  Mouth: Clear, moist, no erythema, exudate  Cardiovascular: Well perfused, no edema, no cyanosis  Respiratory: No increased work of breathing, no evidence of respiratory distress  GI: Abdomen non distended, no rebound, guarding  Skin: Dry, warm, no lesions  MSK: No swelling, deformities. ROM intact  Neuro: Moving all extremities, GCS 12 (E: 2, V: 4, M: 6), confused, poor memory    Diagnostic Study Results     Labs -     Results       Procedure Component Value Units Date/Time    Comprehensive metabolic panel [[id number]]  (Abnormal) Collected: 04/24/21 0053    Specimen: Blood Updated: 04/24/21 0130      Glucose 327 mg/dL      BUN 60.0 mg/dL      Creatinine 8.2 mg/dL      Sodium 135 mEq/L      Potassium 5.8 mEq/L      Chloride 100 mEq/L      CO2 23 mEq/L      Calcium 8.4 mg/dL      Protein, Total 6.4 g/dL      Albumin 2.6 g/dL      AST (SGOT) 24 U/L      ALT 32 U/L      Alkaline Phosphatase 297 U/L      Bilirubin, Total 0.3 mg/dL  Globulin 3.8 g/dL      Albumin/Globulin Ratio 0.7     Anion Gap 12.0    Narrative:      Start now, if not done today    GFR [[id number]] Collected: 04/24/21 0053     Updated: 04/24/21 0130     EGFR 5.9    Narrative:      Start now, if not done today    CBC and differential [[id number]]  (Abnormal) Collected: 04/24/21 0053    Specimen: Blood Updated: 04/24/21 0123     WBC 6.15 x10 3/uL      Hgb 11.3 g/dL      Hematocrit 37.4 %      Platelets 98 x10 3/uL      RBC 3.95 x10 6/uL      MCV 94.7 fL      MCH 28.6 pg      MCHC 30.2 g/dL      RDW 14 %      MPV 12.9 fL      Instrument Absolute Neutrophil Count 4.90 x10 3/uL      Neutrophils 79.6 %      Lymphocytes Automated 9.9 %      Monocytes 8.5 %      Eosinophils Automated 1.0 %      Basophils Automated 0.5 %      Immature Granulocytes 0.5 %      Nucleated RBC 0.0 /100 WBC      Neutrophils Absolute 4.90 x10 3/uL      Lymphocytes Absolute Automated 0.61 x10 3/uL      Monocytes Absolute Automated 0.52 x10 3/uL      Eosinophils Absolute Automated 0.06 x10 3/uL      Basophils Absolute Automated 0.03 x10 3/uL      Immature Granulocytes Absolute 0.03 x10 3/uL      Absolute NRBC 0.00 x10 3/uL     Narrative:      Start now, if not done today    Ammonia [[id number]] Collected: 04/24/21 0053    Specimen: Blood Updated: 04/24/21 0113     Ammonia 47 umol/L     CBC and differential [[id number]]  (Abnormal) Collected: 04/24/21 0002    Specimen: Blood Updated: 04/24/21 0019     WBC 6.44 x10 3/uL      Hgb 10.9 g/dL      Hematocrit 35.6 %      Platelets 94 x10 3/uL      RBC 3.78 x10 6/uL      MCV 94.2 fL      MCH 28.8 pg      MCHC 30.6 g/dL      RDW 14 %       MPV 12.9 fL      Instrument Absolute Neutrophil Count 4.85 x10 3/uL      Neutrophils 75.3 %      Lymphocytes Automated 11.6 %      Monocytes 10.9 %      Eosinophils Automated 1.1 %      Basophils Automated 0.6 %      Immature Granulocytes 0.5 %      Nucleated RBC 0.0 /100 WBC      Neutrophils Absolute 4.85 x10 3/uL      Lymphocytes Absolute Automated 0.75 x10 3/uL      Monocytes Absolute Automated 0.70 x10 3/uL      Eosinophils Absolute Automated 0.07 x10 3/uL      Basophils Absolute Automated 0.04 x10 3/uL      Immature Granulocytes Absolute 0.03  x10 3/uL      Absolute NRBC 0.00 x10 3/uL     Narrative:      Start now, if not done today    Potassium [[id number]] Collected: 04/24/21 0002    Specimen: Blood Updated: 04/24/21 0003    Blood gas, arterial [[id number]]  (Abnormal) Collected: 04/23/21 2221    Specimen: Blood, Arterial Updated: 04/23/21 2240     pH, Arterial 7.242     pCO2, Arterial 59.5 mmhg      pO2, Arterial <42.7 mmhg      HCO3, Arterial 24.7 mEq/L      Arterial Total CO2 52.8 mEq/L      Base Excess, Arterial -3.0 mEq/L      O2 Sat, Arterial 45.3 %      ABG CollectionSite Right Radl     Allen's Test Yes     Temperature 37.0     FIO2 100 %      Status Oxygen     O2 Delivery Non-Rebreather     O2 Flow 15.0 L/min     Glucose Whole Blood - POCT [[id number]]  (Abnormal) Collected: 04/23/21 2226     Updated: 04/23/21 2234     Whole Blood Glucose POCT 293 mg/dL     Glucose Whole Blood - POCT [[id number]]  (Abnormal) Collected: 04/23/21 2030     Updated: 04/23/21 2033     Whole Blood Glucose POCT 233 mg/dL     Arterial Blood Gas (ABG) [[id number]]  (Abnormal) Collected: 04/23/21 1841    Specimen: Blood, Arterial Updated: 04/23/21 1903     pH, Arterial 7.359     pCO2, Arterial 40.5 mmhg      pO2, Arterial 113.0 mmhg      HCO3, Arterial 22.3 mEq/L      Arterial Total CO2 46.0 mEq/L      Base Excess, Arterial -2.4 mEq/L      O2 Sat, Arterial 97.7 %      ABG CollectionSite Right Radl     Allen's Test Yes     Temperature  37.0     O2 Delivery Nasal Cannula     O2 Flow 2.0 L/min     Glucose Whole Blood - POCT [[id number]]  (Abnormal) Collected: 04/23/21 1547     Updated: 04/23/21 1549     Whole Blood Glucose POCT 332 mg/dL     Glucose Whole Blood - POCT [[id number]]  (Abnormal) Collected: 04/23/21 1437     Updated: 04/23/21 1440     Whole Blood Glucose POCT 257 mg/dL     COVID-19 (SARS-CoV-2) only ([facility]) asymptomatic admission [[id number]] Collected: 04/23/21 1352    Specimen: Nasopharyngeal Updated: 04/23/21 1429     Purpose of COVID testing Screening     SARS-CoV-2 Specimen Source Nasal Swab     SARS CoV 2 Overall Result Not Detected    Narrative:      o Collect and clearly label specimen type:  o PREFERRED-Upper respiratory specimen: One Nasal Swab in  [employer].  o Hand deliver to laboratory ASAP  Indication for testing->Extended care facility admission to  semi private room  Screening    Comprehensive metabolic panel [[id number]]  (Abnormal) Collected: 04/23/21 1352    Specimen: Blood Updated: 04/23/21 1416     Glucose 320 mg/dL      BUN 57.0 mg/dL      Creatinine 7.6 mg/dL      Sodium 136 mEq/L      Potassium 5.8 mEq/L      Chloride 99 mEq/L  CO2 23 mEq/L      Calcium 8.4 mg/dL      Protein, Total 6.5 g/dL      Albumin 2.7 g/dL      AST (SGOT) 20 U/L      ALT 31 U/L      Alkaline Phosphatase 279 U/L      Bilirubin, Total 0.6 mg/dL      Globulin 3.8 g/dL      Albumin/Globulin Ratio 0.7     Anion Gap 14.0    Ethanol (Alcohol) Level [[id number]] Collected: 04/23/21 1352    Specimen: Blood Updated: 04/23/21 1416     Alcohol NONE DETECTED mg/dL     GFR [[id number]] Collected: 04/23/21 1352     Updated: 04/23/21 1416     EGFR 6.5    CBC and differential [[id number]]  (Abnormal) Collected: 04/23/21 1352    Specimen: Blood Updated: 04/23/21 1409     WBC 6.41 x10 3/uL      Hgb 11.1 g/dL      Hematocrit 36.2 %      Platelets 103 x10 3/uL      RBC 3.85 x10 6/uL      MCV 94.0 fL      MCH 28.8 pg      MCHC 30.7 g/dL      RDW 14 %       MPV 12.2 fL      Instrument Absolute Neutrophil Count 4.52 x10 3/uL      Neutrophils 70.5 %      Lymphocytes Automated 16.4 %      Monocytes 11.1 %      Eosinophils Automated 0.5 %      Basophils Automated 1.2 %      Immature Granulocytes 0.3 %      Nucleated RBC 0.0 /100 WBC      Neutrophils Absolute 4.52 x10 3/uL      Lymphocytes Absolute Automated 1.05 x10 3/uL      Monocytes Absolute Automated 0.71 x10 3/uL      Eosinophils Absolute Automated 0.03 x10 3/uL      Basophils Absolute Automated 0.08 x10 3/uL      Immature Granulocytes Absolute 0.02 x10 3/uL      Absolute NRBC 0.00 x10 3/uL     Glucose Whole Blood - POCT [[id number]]  (Abnormal) Collected: 04/23/21 1352     Updated: 04/23/21 1354     Whole Blood Glucose POCT 275 mg/dL             Radiologic Studies -   Radiology Results (24 Hour)       Procedure Component Value Units Date/Time    XR Chest AP Portable [[id number]] Collected: 04/23/21 2302    Order Status: Completed Updated: 04/23/21 2305    Narrative:      Indication:? aspiration    Technique: A portable frontal view of the chest was performed.     Comparison:04/23/2021    Findings:  Left shoulder arthroplasty changes. No focal consolidation or  pleural effusion. No pneumothorax. Heart and mediastinal contours are  normal.      Impression:       No acute findings. Evaluation slightly limited due to poor  penetration.    Jeanella Cara, MD  04/23/2021 11:03 PM    CT Hip Right WO Contrast [[id number]] Collected: 04/23/21 1738    Order Status: Completed Updated: 04/23/21 1756    Narrative:      Exam:CT right hip without contrast    INDICATION:Ground-level fall  COMPARISON:None    TECHNIQUE:Axial CT images of the right hip were performed without IV  contrast.  Multiplanar reconstructions obtained.    Note that CT scanning at this site  utilizes multiple dose reduction  techniques including automatic exposure control, adjustment of the MAA  and/or KVP according to patient's size and use of  iterative  reconstruction technique    FINDINGS:Osseous structures: There is mild to moderate osteopenia. The  right SI joint is intact. There are no acute fractures. Right femoral  head is seated within the acetabulum. Pubic symphysis is intact with  chondrocalcinosis suggesting CPPD.    Soft tissues: Calcified uterine fibroid seen. Urinary bladder is  unremarkable. There is anasarca. Severe calcific arteriosclerosis.      Impression:      No acute fractures or subluxation right hip.    CPPD.    Anasarca.    Macon Large, MD  04/23/2021 5:54 PM    CT Abdomen Pelvis WO Contrast [[id number]] Collected: 04/23/21 1723    Order Status: Completed Updated: 04/23/21 1753    Narrative:      Exam:CT abdomen and pelvis without IV contrast    History: Abdominal trauma, blunt       COMPARISON: 04/13/2021    Technique: Thin section CT scan of the abdomen and pelvis without IV  contrast. Multiplanar reconstructions obtained.    Note: Note that CT scanning at this site  utilizes multiple dose  reduction techniques including automatic exposure control, adjustment of  the MAA and/or KVP according to patient's size and use of iterative  reconstruction technique    ABDOMEN FINDINGS:     LUNG BASES: Enlarged left breast with fat stranding. Please see CT chest  report dated 04/23/2021.    LIVER: Unremarkable    GALLBLADDER/Biliary Tree: Small gallstones seen in the neck of the  gallbladder measuring 4 mm. No intra or extrahepatic bile duct  dilatation.    PANCREAS: Unremarkable    SPLEEN: Unremarkable    ADRENALS: Unremarkable    KIDNEYS: Kidneys are atrophic.    VASCULATURE: Severe calcific arteriosclerosis present.    LYMPH NODES: No enlarged lymph nodes.    BOWEL: No bowl wall thickening or dilatation. Colonic diverticulosis.  There is diastases the abdominis rectus with outpouching of small bowel  loops without obstruction.    APPENDIX:No evidence of acute appendicitis.    PERITONEAL CAVITY:There is a trace of free fluid  without free air.    PELVIC FINDINGS:    BLADDER: Unremarkable    REPRODUCTIVE ORGANS: Numerous calcified uterine fibroid seen.    ABDOMINAL WALL: There is anasarca. Small dot of gas is seen in the lower  anterior abdominal wall ventrally ingesting injection site. There is an  area of ventral abdominal wall deformity and soft tissue thickening in  the left paramedian measuring 2.1 cm suggesting scarring. There is 1 cm  area of low attenuation in this area of soft tissue thickening which may  represent residual fluid. No soft tissue gas seen.    BONES: No acute or aggressive osseous findings.    OTHER:      Impression:       No solid or viscus organ injury. No free free air. Trace of  free fluid.    Anasarca.    Scarring in the left paramedian abdominal wall with a 1 cm area of low  attenuation in the subcutaneous tissues which may represent residual  fluid. A smaller than on prior exam.    Abnormal appearing left breast.  Please see CT chest report dated  04/23/2021.    Cholelithiasis. Small hiatal hernia    Diastases abdominis rectus.Macon Large, MD  04/23/2021 5:51 PM    CT Chest without Contrast [[id number]] Collected: 04/23/21 1722    Order Status: Completed Updated: 04/23/21 1740    Narrative:      Examination:  CT of the chest was performed without intravenous contrast. Multiplanar  reconstructions obtained.    Clinical History:Fall yesterday with right-sided pain    Comparison:None    Note that CT scanning at this site  utilizes multiple dose reduction  techniques including automatic exposure control, adjustment of the MAA  and/or KVP according to patient's size and use of iterative  reconstruction technique    Findings:    Nodes: There is an enlarged left hilar lymph node measuring 12 mm. Is an  enlarged precarinal lymph node measuring 12 mm. There are enlarged  axillary and subpectoral lymph nodes on the left with a subpectoral  lymph nodes measuring 14 mm and left breast lymph nodes measuring up  to  15 mm.    pleura: There are no pleural effusion.    Mediastinum and vasculature: There is a small hiatal hernia.    Heart: Cardiac size is normal. There is no pericardial effusion.  There  is severe calcific coronary disease.    Lungs:There is a 4 mm subpleural nodule left lower lobe image 62. There  is a 5 mm nodule in the right lower lobe image 63..There are groundglass  opacities and intralobular septal thickening suggesting mild pulmonary  edema. No pneumothorax.    Airways: Unremarkable.    Chest wall/Soft Tissues: There is enlargement of the left breast. There  are skin thickening. There is extensive stranding of the soft tissues of  the left breast. Differential diagnosis includes left breast and chest  wall contusion from trauma versus left breast neoplasm versus mastitis.  I would recommend workup at a dedicated mammography Center with  mammograms and ultrasound.    Upper abdomen: Small gallstones seen in the neck of the gallbladder  measuring 6 mm. Severe calcific arteriosclerosis present. Visualized  solid organs are otherwise unremarkable.    Bones: No acute or aggressive osseous findings.      Impression:          Groundglass opacities and interlobular septal thickening suggesting mild  to moderate pulmonary edema.    Bilateral pulmonary nodules. Follow-up as per Fleischner Society  protocol.    Enlarged left breast with skin thickening and extensive fat stranding  with subpectoral, left breast and mediastinal adenopathy. Differential  diagnosis includes left breast contusion from trauma versus neoplasm  versus mastitis. I would recommend further workup of these findings at a  dedicated [facility] with mammograms and ultrasound. Findings  were discussed with Dr. Hartford Poli 5:35 PM 05/23/2021.    Enlarged mediastinal and hilar lymph nodes. Follow-up to exclude any  myeloproliferative or neoplastic process.    Small hiatal hernia. Cholelithiasis.    Fleischner Society pulmonary nodule  guidelines (revised 2017):    Multiple solid nodules less than 6 mm:  Low risk for lung cancer: No follow-up.  High risk for lung cancer: Optional chest CT in12 months.            Macon Large, MD  04/23/2021 5:38 PM    XR Pelvis Limited 1 Or 2 Views [[id number]] Collected: 04/23/21 1552    Order Status: Completed Updated: 04/23/21 1556    Narrative:  INDICATION: Trauma. Pain.    TECHNIQUE: One view.    FINDINGS: No definite fracture is demonstrated. The study is however  significantly limited by underpenetration. Additional radiographs or CT  are recommended. There is vascular calcification. There are calcified  fibroids in the central pelvis.      Impression:       No definite fracture is demonstrated. The study is however  significantly limited by underpenetration. Additional radiographs or CT  are recommended.     Steward Drone, MD  04/23/2021 3:54 PM    Ribs Right/PA Chest [[id number]] Collected: 04/23/21 1536    Order Status: Completed Updated: 04/23/21 1539    Narrative:      Indication: Fall, right body pain.    FINDINGS: Right rib series. 3 views. Osteopenia. Difficult to penetrate  the soft tissues. No fracture seen. No pleural effusion or pneumothorax.      Impression:       Limited study due to osteopenia and soft tissue density. No  fracture seen.    Jennell Corner, MD  04/23/2021 3:37 PM    CT Head without Contrast [[id number]] Collected: 04/23/21 1425    Order Status: Completed Updated: 04/23/21 1429    Narrative:      INDICATION: Head trauma.    TECHNIQUE: Multiple transaxial images of the head were obtained. No  intravenous contrast was administered. A combination of automatic  exposure control, adjustment of the mA and or KV according to patient  size, and/or use of iterative reconstruction technique was utilized.    FINDINGS: No intracranial mass lesion, hemorrhage, or territorial  infarction is demonstrated. Ventricular system and subarachnoid spaces  are within normal limits. No regions of  abnormal density are noted  within the brain parenchyma. The visualized portions of the sinuses and  mastoid air cells are clear.      Impression:       Unremarkable noncontrast CT of the brain.    Steward Drone, MD  04/23/2021 2:27 PM        .    Medical Decision Making   I am the first provider for this patient.    I reviewed the vital signs, available nursing notes, past medical history, past surgical history, family history and social history.    Vital Signs-Reviewed the patient's vital signs.     Vitals:    04/23/21 2231 04/23/21 2317 04/24/21 0044 04/24/21 0116   BP: 150/66 147/66     Pulse: 65 62 62    Resp: 22 (!) 8 12    Temp:  97.7 F (36.5 C)     TempSrc:  Oral     SpO2: 100% 96% 98% 98%   Weight:       Height:           Pulse Oximetry Analysis - Satting 86% on RA initially, improved to mid 90s on 2L NC    Procedures:   Critical Care    Performed by: Hartford Poli, MD  Authorized by: Hartford Poli, MD    Critical care provider statement:     Critical care time (minutes):  40    Critical care was necessary to treat or prevent imminent or life-threatening deterioration of the following conditions:  Metabolic crisis and respiratory failure    Critical care was time spent personally by me on the following activities:  Ordering and performing treatments and interventions, ordering and review of laboratory studies, ordering and review of radiographic studies, evaluation of patient's response to treatment, re-evaluation of patient's  condition, examination of patient and obtaining history from patient or surrogate    I assumed direction of critical care for this patient from another provider in my specialty: no      Care discussed with: admitting provider        Medical Decision Making  Presenting complaint: Altered mental status  Clinical status: Lethargic, afebrile, hypertensive  Differential diagnosis: Infection, including UTI, pneumonia, intra abdominal infection, electrolyte abnormality,  intracranial abnormality including ICH, CVA, dehydration, traumatic injury, anemia, hypertensive encephalopathy, hypercarbia  Management: Work up with hyperkalemia, given calcium, insulin/dextrose for initial treatment. Patient desatting on room air, started on 2L NC with improvement to mid 90s. CT head, CT chest, CT abdomen negative for traumatic injury, did show evidence of left breast irregularity, concerning for possible malignancy. During ED stay, patient exhibiting waxing and waning LOC, concerning for delirium, no infection noted on work up. Admitted to [facility] for AMS under Dr Felipe Drone.      Problems Addressed:  Altered mental status: complicated acute illness or injury    Amount and/or Complexity of Data Reviewed  Labs: ordered.  Radiology: ordered.    Risk  OTC drugs.  Prescription drug management.        ED course:         Diagnosis     Clinical Impression:   1. Altered mental status        Treatment Plan:   ED Disposition       ED Disposition   Observation    Condition   --    Date/Time   Sat Apr 23, 2021  7:13 PM    Comment   Admitting Physician: Barbaraann Faster [[id number]]   Service:: Medicine [[id number]]   Estimated Length of Stay: < 2 midnights   Tentative Discharge Plan?: Home or Self Care [1]   Does patient need telemetry?: Yes   Is patient 18 yrs or greater?: Yes   Telemetry type (separate Telemetry order is also required):: Adult telemetry                        Hartford Poli, MD  04/24/21 0234

## 2021-04-23 NOTE — EDIE (Signed)
COLLECTIVE?NOTIFICATION?04/23/2021 13:27?[name], [name]?MRN: [id number]    Criteria Met      5 ED Visits in 12 Months    Security and Safety  No Security Events were found.  ED Care Guidelines  There are currently no ED Care Guidelines for this patient. Please check your facility's medical records system.    Flags      Negative COVID-19 Lab Result - [facility] - A specimen collected from this patient was negative for COVID-19 / Attributed By: [place] [facility] / Attributed On: 04/15/2021       Prescription Monitoring Program  290??- Narcotic Use Score  240??- Sedative Use Score  000??- Stimulant Use Score  150??- Overdose Risk Score  - All Scores range from 000-999 with 75% of the population scoring < 200 and on 1% scoring above 650  - The last digit of the narcotic, sedative, and stimulant score indicates the number of active prescriptions of that type  - Higher Use scores correlate with increased prescribers, pharmacies, mg equiv, and overlapping prescriptions  - Higher Overdose Risk Scores correlate with increased risk of unintentional overdose death   Concerning or unexpectedly high scores should prompt a review of the PMP record; this does not constitute checking PMP for prescribing purposes.    E.D. Visit Count (12 mo.)  Facility Visits   [facility] 7   Total 7   Note: Visits indicate total known visits.     Recent Emergency Department Visit Summary  Date Facility [facility] Type Diagnoses or Chief Complaint    Apr 23, 2021  [place].  [place]  [place]  Emergency      confused      Apr 13, 2021  [place].  [place].  [place]  Emergency      Abdominal Pain      Fluid overload, unspecified      Mar 03, 2021  [place].  [place]  [place]  Emergency      SOB x 1 week      Shortness of Breath      Hyperglycemia      Hypertension      Patient's noncompliance with other medical treatment and regimen due to unspecified reason      Shortness of breath      Other specified  abnormalities of plasma proteins      Hyperkalemia      Dependence on renal dialysis      End stage renal disease      Feb 04, 2021  [place].  [place].  [place]  Emergency      SOB      Difficulty breathing      Cough      Hypertensive emergency      Jan 05, 2021  [place] [facility]  [place].  [place]  Emergency      Loss of appetite      Shortness of Breath      Diarrhea      Fluid overload, unspecified      Nov 12, 2020  [place].  [place].  [place]  Emergency      "flu-like"sx,N,V,D x 3 days      Missed Dialysis      Flu like symptoms      End stage renal disease      Oct 20, 2020  [place].  [place].  [place]  Emergency  hyperglycemia      Fall      Wrist Pain      Hip Pain      Shortness of breath        Recent Inpatient Visit Summary  Date [place] Type Diagnoses or Chief Complaint    Apr 13, 2021  [place].  [place]  [place]  Medical Surgical      Fluid overload, unspecified      Jan 05, 2021  [facility] [facility]  [place]  [place]  Medical Surgical      Fluid overload, unspecified      Nov 12, 2020  [place].  [place].  [place]  Medical Surgical      End stage renal disease      Hyperkalemia      Myalgia, unspecified site      Diarrhea, unspecified      Vomiting, unspecified      Oct 20, 2020  [place].  [place]  [place]  Medical Surgical      Shortness of breath      End stage renal disease      Hypoxemia        Care Team  Provider Specialty Phone Fax Service Dates   Hazeline Junker Case Manager/Care Coordinator [phone]  Current    Theone Murdoch, DNP Nurse Practitioner: Family [phone] Current    PHAM, TRI M, MD Internal Medicine   Current      Collective Portal  This patient has registered at the [facility] Emergency Department   For more information visit: [email or web address]     PLEASE NOTE:     1.   Any care recommendations and other clinical information are  provided as guidelines or for historical purposes only, and providers should exercise their own clinical judgment when providing care.    2.   You may only use this information for purposes of treatment, payment or health care operations activities, and subject to the limitations of applicable [employer] Policies.    3.   You should consult directly with the organization that provided a care guideline or other clinical history with any questions about additional information or accuracy or completeness of information provided.    ? 2023 [employer] - [email or web address]

## 2021-04-23 NOTE — ED Notes (Signed)
Bed: GR14  Expected date:   Expected time:   Means of arrival:   Comments:  Medic 205

## 2021-04-23 NOTE — ED Notes (Signed)
UPDATED TRAIGE: PER FAMILY, PT FELL YESTERDAY WHILE ON COMMODE FORWARD AND HIT FACE. UNKNOWN LOC. PER FAMILY PT HASNT BEEN ACTING APPROPRIATELY TODAY AND WAKING UP ABNORMALLY YELLING. PT CONITNUES TO DO THAT IN ER. PT FAMILY PT IS M-W-F DIALYSIS LAST DIALYSIS WAS Friday.

## 2021-04-24 ENCOUNTER — Observation Stay: Payer: 59

## 2021-04-24 LAB — CBC AND DIFFERENTIAL
Absolute NRBC: 0 10*3/uL (ref 0.00–0.00)
Absolute NRBC: 0 10*3/uL (ref 0.00–0.00)
Basophils Absolute Automated: 0.03 10*3/uL (ref 0.00–0.08)
Basophils Absolute Automated: 0.04 10*3/uL (ref 0.00–0.08)
Basophils Automated: 0.5 %
Basophils Automated: 0.6 %
Eosinophils Absolute Automated: 0.06 10*3/uL (ref 0.00–0.44)
Eosinophils Absolute Automated: 0.07 10*3/uL (ref 0.00–0.44)
Eosinophils Automated: 1 %
Eosinophils Automated: 1.1 %
Hematocrit: 35.6 % (ref 34.7–43.7)
Hematocrit: 37.4 % (ref 34.7–43.7)
Hgb: 10.9 g/dL — ABNORMAL LOW (ref 11.4–14.8)
Hgb: 11.3 g/dL — ABNORMAL LOW (ref 11.4–14.8)
Immature Granulocytes Absolute: 0.03 10*3/uL (ref 0.00–0.07)
Immature Granulocytes Absolute: 0.03 10*3/uL (ref 0.00–0.07)
Immature Granulocytes: 0.5 %
Immature Granulocytes: 0.5 %
Instrument Absolute Neutrophil Count: 4.85 10*3/uL (ref 1.10–6.33)
Instrument Absolute Neutrophil Count: 4.9 10*3/uL (ref 1.10–6.33)
Lymphocytes Absolute Automated: 0.61 10*3/uL (ref 0.42–3.22)
Lymphocytes Absolute Automated: 0.75 10*3/uL (ref 0.42–3.22)
Lymphocytes Automated: 11.6 %
Lymphocytes Automated: 9.9 %
MCH: 28.6 pg (ref 25.1–33.5)
MCH: 28.8 pg (ref 25.1–33.5)
MCHC: 30.2 g/dL — ABNORMAL LOW (ref 31.5–35.8)
MCHC: 30.6 g/dL — ABNORMAL LOW (ref 31.5–35.8)
MCV: 94.2 fL (ref 78.0–96.0)
MCV: 94.7 fL (ref 78.0–96.0)
MPV: 12.9 fL — ABNORMAL HIGH (ref 8.9–12.5)
MPV: 12.9 fL — ABNORMAL HIGH (ref 8.9–12.5)
Monocytes Absolute Automated: 0.52 10*3/uL (ref 0.21–0.85)
Monocytes Absolute Automated: 0.7 10*3/uL (ref 0.21–0.85)
Monocytes: 10.9 %
Monocytes: 8.5 %
Neutrophils Absolute: 4.85 10*3/uL (ref 1.10–6.33)
Neutrophils Absolute: 4.9 10*3/uL (ref 1.10–6.33)
Neutrophils: 75.3 %
Neutrophils: 79.6 %
Nucleated RBC: 0 /100 WBC (ref 0.0–0.0)
Nucleated RBC: 0 /100 WBC (ref 0.0–0.0)
Platelets: 94 10*3/uL — ABNORMAL LOW (ref 142–346)
Platelets: 98 10*3/uL — ABNORMAL LOW (ref 142–346)
RBC: 3.78 10*6/uL — ABNORMAL LOW (ref 3.90–5.10)
RBC: 3.95 10*6/uL (ref 3.90–5.10)
RDW: 14 % (ref 11–15)
RDW: 14 % (ref 11–15)
WBC: 6.15 10*3/uL (ref 3.10–9.50)
WBC: 6.44 10*3/uL (ref 3.10–9.50)

## 2021-04-24 LAB — GLUCOSE WHOLE BLOOD - POCT
Whole Blood Glucose POCT: 311 mg/dL — ABNORMAL HIGH (ref 70–100)
Whole Blood Glucose POCT: 290 mg/dL — ABNORMAL HIGH (ref 70–100)
Whole Blood Glucose POCT: 409 mg/dL — ABNORMAL HIGH (ref 70–100)
Whole Blood Glucose POCT: 474 mg/dL — ABNORMAL HIGH (ref 70–100)

## 2021-04-24 LAB — BASIC METABOLIC PANEL
Anion Gap: 15 (ref 5.0–15.0)
Anion Gap: 13 (ref 5.0–15.0)
Anion Gap: 16 — ABNORMAL HIGH (ref 5.0–15.0)
BUN: 64 mg/dL — ABNORMAL HIGH (ref 7.0–21.0)
BUN: 65 mg/dL — ABNORMAL HIGH (ref 7.0–21.0)
BUN: 46 mg/dL — ABNORMAL HIGH (ref 7.0–21.0)
CO2: 21 mEq/L (ref 17–29)
CO2: 23 mEq/L (ref 17–29)
CO2: 22 mEq/L (ref 17–29)
Calcium: 8.3 mg/dL — ABNORMAL LOW (ref 8.5–10.5)
Calcium: 8.4 mg/dL — ABNORMAL LOW (ref 8.5–10.5)
Calcium: 8.6 mg/dL (ref 8.5–10.5)
Chloride: 99 mEq/L (ref 99–111)
Chloride: 101 mEq/L (ref 99–111)
Chloride: 98 mEq/L — ABNORMAL LOW (ref 99–111)
Creatinine: 8.4 mg/dL — ABNORMAL HIGH (ref 0.4–1.0)
Creatinine: 8.5 mg/dL — ABNORMAL HIGH (ref 0.4–1.0)
Creatinine: 6.5 mg/dL — ABNORMAL HIGH (ref 0.4–1.0)
Glucose: 367 mg/dL — ABNORMAL HIGH (ref 70–100)
Glucose: 381 mg/dL — ABNORMAL HIGH (ref 70–100)
Glucose: 468 mg/dL — ABNORMAL HIGH (ref 70–100)
Potassium: 6.6 mEq/L (ref 3.5–5.3)
Potassium: 6.3 mEq/L (ref 3.5–5.3)
Potassium: 5.4 mEq/L — ABNORMAL HIGH (ref 3.5–5.3)
Sodium: 135 mEq/L (ref 135–145)
Sodium: 137 mEq/L (ref 135–145)
Sodium: 136 mEq/L (ref 135–145)

## 2021-04-24 LAB — WHOLE BLOOD GLUCOSE POCT
Whole Blood Glucose POCT: 364 mg/dL — ABNORMAL HIGH (ref 70–100)
Whole Blood Glucose POCT: 476 mg/dL — ABNORMAL HIGH (ref 70–100)

## 2021-04-24 LAB — COMPREHENSIVE METABOLIC PANEL
ALT: 32 U/L (ref 0–55)
AST (SGOT): 24 U/L (ref 5–41)
Albumin/Globulin Ratio: 0.7 — ABNORMAL LOW (ref 0.9–2.2)
Albumin: 2.6 g/dL — ABNORMAL LOW (ref 3.5–5.0)
Alkaline Phosphatase: 297 U/L — ABNORMAL HIGH (ref 37–117)
Anion Gap: 12 (ref 5.0–15.0)
BUN: 60 mg/dL — ABNORMAL HIGH (ref 7.0–21.0)
Bilirubin, Total: 0.3 mg/dL (ref 0.2–1.2)
CO2: 23 mEq/L (ref 17–29)
Calcium: 8.4 mg/dL — ABNORMAL LOW (ref 8.5–10.5)
Chloride: 100 mEq/L (ref 99–111)
Creatinine: 8.2 mg/dL — ABNORMAL HIGH (ref 0.4–1.0)
Globulin: 3.8 g/dL — ABNORMAL HIGH (ref 2.0–3.6)
Glucose: 327 mg/dL — ABNORMAL HIGH (ref 70–100)
Potassium: 5.8 mEq/L — ABNORMAL HIGH (ref 3.5–5.3)
Protein, Total: 6.4 g/dL (ref 6.0–8.3)
Sodium: 135 mEq/L (ref 135–145)

## 2021-04-24 LAB — VANCOMYCIN, RANDOM: Vancomycin Random: 20.4 ug/mL

## 2021-04-24 LAB — GFR
EGFR: 5.9
EGFR: 5.8
EGFR: 5.7
EGFR: 7.7

## 2021-04-24 LAB — AMMONIA: Ammonia: 47 umol/L (ref 18–72)

## 2021-04-24 LAB — HIGH SENSITIVITY TROPONIN-I: hs Troponin-I: 58.6 ng/L — AB

## 2021-04-24 MED ORDER — VANCOMYCIN PHARMACY TO DOSE PLACEHOLDER
INTRAVENOUS | Status: DC
Start: 2021-04-25 — End: 2021-05-06

## 2021-04-24 MED ORDER — IOHEXOL 350 MG/ML IV SOLN
100.0000 mL | Freq: Once | INTRAVENOUS | Status: AC | PRN
Start: 2021-04-24 — End: 2021-04-24
  Administered 2021-04-24: 100 mL via INTRAVENOUS

## 2021-04-24 MED ORDER — SODIUM CHLORIDE 0.9 % IV BOLUS
250.0000 mL | INTRAVENOUS | Status: AC | PRN
Start: 2021-04-24 — End: 2021-04-24

## 2021-04-24 MED ORDER — SODIUM POLYSTYRENE SULFONATE 15 GM/60ML PO SUSP
30.0000 g | Freq: Once | ORAL | Status: AC
Start: 2021-04-24 — End: 2021-04-24
  Administered 2021-04-24: 30 g via ORAL
  Filled 2021-04-24: qty 120

## 2021-04-24 MED ORDER — DOXYCYCLINE MONOHYDRATE 100 MG PO CAPS
100.0000 mg | ORAL_CAPSULE | Freq: Two times a day (BID) | ORAL | Status: DC
Start: 2021-04-24 — End: 2021-04-25
  Administered 2021-04-24 – 2021-04-25 (×3): 100 mg via ORAL
  Filled 2021-04-24 (×3): qty 1

## 2021-04-24 MED ORDER — LACTULOSE ENCEPHALOPATHY 10 GM/15ML PO SOLN
300.0000 mL | Freq: Once | ORAL | Status: DC
Start: 2021-04-24 — End: 2021-04-24
  Filled 2021-04-24: qty 473

## 2021-04-24 MED ORDER — GABAPENTIN 100 MG PO CAPS
100.0000 mg | ORAL_CAPSULE | Freq: Three times a day (TID) | ORAL | Status: DC
Start: 2021-04-24 — End: 2021-05-06
  Administered 2021-04-24 – 2021-05-06 (×35): 100 mg via ORAL
  Filled 2021-04-24 (×36): qty 1

## 2021-04-24 MED ORDER — CALCIUM GLUCONATE-NACL 1-0.675 GM/50ML-% IV SOLN
1.0000 g | Freq: Once | INTRAVENOUS | Status: AC
Start: 2021-04-24 — End: 2021-04-24
  Administered 2021-04-24: 1 g via INTRAVENOUS
  Filled 2021-04-24: qty 50

## 2021-04-24 MED ORDER — DEXTROSE 10 % IV BOLUS
50.0000 g | Freq: Once | INTRAVENOUS | Status: DC
Start: 2021-04-24 — End: 2021-05-06

## 2021-04-24 MED ORDER — INSULIN GLARGINE 100 UNIT/ML SC SOLN
30.0000 [IU] | Freq: Every evening | SUBCUTANEOUS | Status: DC
Start: 2021-04-24 — End: 2021-04-25
  Administered 2021-04-24: 30 [IU] via SUBCUTANEOUS
  Filled 2021-04-24: qty 30

## 2021-04-24 MED ORDER — ALBUTEROL-IPRATROPIUM 2.5-0.5 (3) MG/3ML IN SOLN
3.0000 mL | Freq: Four times a day (QID) | RESPIRATORY_TRACT | Status: DC | PRN
Start: 2021-04-24 — End: 2021-05-06

## 2021-04-24 MED ORDER — VANCOMYCIN HCL IN NACL 750-0.9 MG/250ML-% IV SOLN
750.0000 mg | Freq: Once | INTRAVENOUS | Status: AC
Start: 2021-04-24 — End: 2021-04-24
  Administered 2021-04-24: 750 mg via INTRAVENOUS
  Filled 2021-04-24: qty 750

## 2021-04-24 MED ORDER — SODIUM CHLORIDE 0.9 % IV BOLUS
100.0000 mL | INTRAVENOUS | Status: AC | PRN
Start: 2021-04-24 — End: 2021-04-24

## 2021-04-24 MED ORDER — INSULIN SYRINGES (DISPOSABLE) U-100 1 ML MISC
5.0000 [IU] | Freq: Once | Status: AC
Start: 2021-04-24 — End: 2021-04-24
  Administered 2021-04-24: 5 [IU] via INTRAVENOUS
  Filled 2021-04-24: qty 15

## 2021-04-24 NOTE — Progress Notes (Signed)
Hd tx completed x2hrs, 2.5L net fluid removed, tolerated well, pt aox4, for dialysis again tomorrow, Report given to [facility] RN.   04/24/21 1645   Vitals   Temp 97.7 F (36.5 C)   Heart Rate 72   Resp Rate (!) 48   BP 171/75   SpO2 98 %   Assessment   Mental Status Alert;Oriented;Cooperative   Cardiac (WDL) WDL   Cardiac Regularity Regular   Cardiac Symptoms None   Cardiac Rhythm Normal Sinus Rhythm   Respiratory  WDL   Respiratory Pattern Regular   Bilateral Breath Sounds Diminished   Cough Spontaneous   Edema  WDL   Generalized Edema Non Pitting Edema   RLE Edema +1   LLE Edema +1   General Skin Color Appropriate for ethnicity   Skin Condition/Temp Warm;Dry   Gastrointestinal (WDL) WDL   Abdomen Inspection Soft   GI Symptoms None   Mobility Bed   Pain Assessment   Charting Type Reassessment   Pain Scale Used Numeric Scale (0-10)   Numeric Pain Scale   Pain Score 4   POSS Score 1   Pain Location Mouth;Rib cage   Pain Orientation Anterior   Pain Descriptors Aching   Pain Frequency Increases with movement;Continuous   Effect of Pain on Daily Activities moderate   Patient's Stated Comfort Functional Goal 1   Pain Intervention(s) Medication   Multiple Pain Sites No   Education   Person taught Patient   Knowledge basis Substantial   Topics taught Procedure;K+   Teaching Tools Explain   Reponse Verbalizes Understanding   Bedside Nurse Communication   Name of bedside RN - post dialysis Worthy Flank RN

## 2021-04-24 NOTE — Consults (Signed)
Pharmacy Vancomycin Dosing Consult    Day 1  Vancomycin Indication: Abdominal Wound Infection  Vancomycin Monitoring Goal: pre HD 15-25 mcg/ml  Current Regimen: dose by level      Pertinent Cultures:   No new, 04/14/21 MRSA in [facility]     Serum creatinine: 8.5 mg/dL (H) 04/24/21 1223  Estimated creatinine clearance: 7.6 mL/min (A)  Baseline Scr ESRD on HD MWF  Nephrotoxic agents/conditions: Advanced Age (>65Y)    Measured Vancomycin Level:   Recent Labs   Lab 04/19/21  0338 04/18/21  0451   Vancomycin Random 28.1 24.7       Assessment/Recommendations:   - Pre HD random 20.4 mcg/ml, therapeutic  - Dose 750 mg IV once post HD    Thank you for this consult. Pharmacy will continue to follow this patient's progress with you until consult and/or vancomycin is discontinued.    Joesph Fillers, [place], Pharm.D.  Phone: [phone]

## 2021-04-24 NOTE — Plan of Care (Signed)
Problem: Moderate/High Fall Risk Score >5  Goal: Patient will remain free of falls  Outcome: Progressing  Flowsheets (Taken 04/24/2021 2043)  Moderate Risk (6-13): MOD-Apply bed exit alarm if patient is confused  VH High Risk (Greater than 13):   ALL REQUIRED LOW INTERVENTIONS   ALL REQUIRED MODERATE INTERVENTIONS   RED "HIGH FALL RISK" SIGNAGE   BED ALARM WILL BE ACTIVATED WHEN THE PATEINT IS IN BED WITH SIGNAGE "RESET BED ALARM"   Use assistive devices   Use of floor mat     Problem: Fluid and Electrolyte Imbalance/ Endocrine  Goal: Fluid and electrolyte balance are achieved/maintained  Outcome: Progressing  Flowsheets (Taken 04/24/2021 0010)  Fluid and electrolyte balance are achieved/maintained:   Monitor intake and output every shift   Monitor/assess lab values and report abnormal values   Provide adequate hydration   Monitor daily weight   Assess for confusion/personality changes   Observe for seizure activity and initiate seizure precautions if indicated   Observe for cardiac arrhythmias   Assess and reassess fluid and electrolyte status   Monitor for muscle weakness     Problem: Nutrition  Goal: Nutritional intake is adequate  Outcome: Progressing  Flowsheets (Taken 04/24/2021 0010)  Nutritional intake is adequate:   Monitor daily weights   Assist patient with meals/food selection   Allow adequate time for meals   Encourage/perform oral hygiene as appropriate   Consult/collaborate with Clinical Nutritionist     Problem: Renal Instability  Goal: Nutritional intake is adequate  Outcome: Progressing  Flowsheets (Taken 04/24/2021 0010)  Nutritional intake is adequate:   Monitor daily weights   Assist patient with meals/food selection   Allow adequate time for meals   Encourage/perform oral hygiene as appropriate   Consult/collaborate with Clinical Nutritionist     Problem: Safety  Goal: Patient will be free from injury during hospitalization  Outcome: Progressing  Flowsheets (Taken 04/24/2021 0010)  Patient will be free  from injury during hospitalization:   Use appropriate transfer methods   Hourly rounding   Include patient/ family/ care giver in decisions related to safety   Provide and maintain safe environment   Ensure appropriate safety devices are available at the bedside   Assess patient's risk for falls and implement fall prevention plan of care per policy   Assess for patients risk for elopement and implement Elopement Risk Plan per policy     Problem: Compromised Tissue integrity  Goal: Damaged tissue is healing and protected  Outcome: Progressing  Flowsheets (Taken 04/24/2021 2043)  Damaged tissue is healing and protected:   Monitor/assess Braden scale every shift   Provide wound care per wound care algorithm   Reposition patient every 2 hours and as needed unless able to reposition self   Increase activity as tolerated/progressive mobility   Relieve pressure to bony prominences for patients at moderate and high risk   Avoid shearing injuries   Keep intact skin clean and dry   Use incontinence wipes for cleaning urine, stool and caustic drainage. Foley care as needed   Use bath wipes, not soap and water, for daily bathing

## 2021-04-24 NOTE — Progress Notes (Signed)
After oral medication provided, pt was fine and talking. Pt requested for a drink, and started coughing and had difficulty breathing. RRT was called due to difficulty to arouse.

## 2021-04-24 NOTE — Plan of Care (Addendum)
Patient resting regular chest ries and fall no sign of acute distress.  Vital signs stable, WNL. See Vitals flow sheets.  IV site WNL, no redness, no edema dressing CDI. Current oxygen flow at 3L via NC and Spo2 > 97%. Non-slip socks on bilateral feet. call lights within reach. patient has been using call light appropriately throughout shift. bed in lowest position. Patient belongings in reach.   K 5.8 gave Kayexalate as order.   Problem: Moderate/High Fall Risk Score >5  Goal: Patient will remain free of falls  Outcome: Progressing  Flowsheets (Taken 04/24/2021 0010)  Moderate Risk (6-13):   LOW-Fall Interventions Appropriate for Low Fall Risk   LOW-Anticoagulation education for injury risk   MOD-Floor mat at bedside (where available) if appropriate   MOD-Consider activation of bed alarm if appropriate   MOD-Apply bed exit alarm if patient is confused  High (Greater than 13):   MOD-Use of chair-pad alarm when appropriate   LOW-Fall Interventions Appropriate for Low Fall Risk   LOW-Anticoagulation education for injury risk  VH Moderate Risk (6-13):   ALL REQUIRED LOW INTERVENTIONS   INITIATE YELLOW "FALL RISK" SIGNAGE  VH High Risk (Greater than 13):   ALL REQUIRED LOW INTERVENTIONS   ALL REQUIRED MODERATE INTERVENTIONS   RED "HIGH FALL RISK" SIGNAGE   BED ALARM WILL BE ACTIVATED WHEN THE PATEINT IS IN BED WITH SIGNAGE "RESET BED ALARM"     Problem: Fluid and Electrolyte Imbalance/ Endocrine  Goal: Fluid and electrolyte balance are achieved/maintained  Outcome: Progressing  Flowsheets (Taken 04/24/2021 0010)  Fluid and electrolyte balance are achieved/maintained:   Monitor intake and output every shift   Monitor/assess lab values and report abnormal values   Provide adequate hydration   Monitor daily weight   Assess for confusion/personality changes   Observe for seizure activity and initiate seizure precautions if indicated   Observe for cardiac arrhythmias   Assess and reassess fluid and electrolyte status   Monitor  for muscle weakness     Problem: Nutrition  Goal: Nutritional intake is adequate  Outcome: Progressing  Flowsheets (Taken 04/24/2021 0010)  Nutritional intake is adequate:   Monitor daily weights   Assist patient with meals/food selection   Allow adequate time for meals   Encourage/perform oral hygiene as appropriate   Consult/collaborate with Clinical Nutritionist     Problem: Potential for Aspiration  Goal: Risk of aspiration will be minimized  Outcome: Progressing  Flowsheets (Taken 04/24/2021 0010)  Risk of aspiration will be minimized:   Dysphagia screen: Keep patient NPO if patient fails screening   Assess and monitor ability to swallow   Place swallow precaution signage above bed   Monitor/assess for signs of aspiration (tachypnea, cough, wheezing, clearing throat, hoarseness after eating, decrease in SaO2)   Instruct patient to take small bites   Instruct patient to take small single sips of liquid   Do not use a straw     Problem: Renal Instability  Goal: Nutritional intake is adequate  Outcome: Progressing  Flowsheets (Taken 04/24/2021 0010)  Nutritional intake is adequate:   Monitor daily weights   Assist patient with meals/food selection   Allow adequate time for meals   Encourage/perform oral hygiene as appropriate   Consult/collaborate with Clinical Nutritionist     Problem: Safety  Goal: Patient will be free from injury during hospitalization  Outcome: Progressing  Flowsheets (Taken 04/24/2021 0010)  Patient will be free from injury during hospitalization:   Use appropriate transfer methods   Hourly rounding  Include patient/ family/ care giver in decisions related to safety   Provide and maintain safe environment   Ensure appropriate safety devices are available at the bedside   Assess patient's risk for falls and implement fall prevention plan of care per policy   Assess for patients risk for elopement and implement Elopement Risk Plan per policy     Problem: Pain  Goal: Pain at adequate level as  identified by patient  Outcome: Progressing  Flowsheets (Taken 04/24/2021 0010)  Pain at adequate level as identified by patient:   Identify patient comfort function goal   Assess for risk of opioid induced respiratory depression, including snoring/sleep apnea. Alert healthcare team of risk factors identified.   Assess pain on admission, during daily assessment and/or before any "as needed" intervention(s)   Reassess pain within 30-60 minutes of any procedure/intervention, per Pain Assessment, Intervention, Reassessment (AIR) Cycle   Evaluate if patient comfort function goal is met   Evaluate patient's satisfaction with pain management progress

## 2021-04-24 NOTE — Plan of Care (Signed)
Problem: Moderate/High Fall Risk Score >5  Goal: Patient will remain free of falls  Outcome: Progressing     Problem: Fluid and Electrolyte Imbalance/ Endocrine  Goal: Fluid and electrolyte balance are achieved/maintained  Outcome: Progressing     Problem: Diabetes: Glucose Imbalance  Goal: Blood glucose stable at established goal  Outcome: Progressing     Problem: Potential for Aspiration  Goal: Risk of aspiration will be minimized  Outcome: Progressing     Problem: Renal Instability  Goal: Fluid and electrolyte balance are achieved/maintained  Outcome: Progressing     Problem: Safety  Goal: Patient will be free from injury during hospitalization  Outcome: Progressing     Problem: Pain  Goal: Pain at adequate level as identified by patient  Outcome: Progressing

## 2021-04-24 NOTE — Consults (Signed)
NEUROLOGY CONSULTATION    Date Time: 04/24/21 3:37 PM  Patient Name: [name]  Attending Physician: Barbaraann Faster, MD      Assessment & Plan:   Episode of syncope short lasting, in the setting of hypoxia with O2 sat in the 50s.-Most likely hypoxia induced--  We will go ahead and repeat CT scan of the head  Continue to follow clinically  Hold off on further testing for now given event seems to be clearly induced by hypoxia especially if CT is negative        History of Present Illness:   Neurology consultation requested by:--> Dr. Felipe Drone     Patient is a 66 year old lady, admitted to the hospital for altered mental status, and a fall, patient states that she tripped and fell, she is complaining currently of chest discomfort, from the area of the fall and also abrasion on her lip.  Chart review also reveals that the patient was altered mentally, when she was brought over to the hospital.  She has a history of end-stage renal disease but she has not missed any dialysis.    Last night, patient became suddenly altered, and had a near syncope-like event, where she was unresponsive for a minute or 2, after some medications were given she was found to be pretty hypoxic in the 50s.  She broke right back up fairly quickly.  No work-up was done yesterday.    She has had a CT scan of the head on admission which was negative no imaging studies were done yesterday    Past Medical History:     Past Medical History:   Diagnosis Date    Chronic obstructive pulmonary disease     Diabetes mellitus     Hyperlipidemia     Hypertension     Sleep apnea 2018       Meds:   Current medications Norvasc Lipitor azithromycin Coreg ceftriaxone insulin doxycycline gabapentin vancomycin      Allergies   Allergen Reactions    Lisinopril Hives    Metformin Hives       Social & Family History:     Social History     Socioeconomic History    Marital status: Legally Separated   Tobacco Use    Smoking status: Never    Smokeless tobacco:  Never   Vaping Use    Vaping status: Never Used   Substance and Sexual Activity    Alcohol use: Never    Drug use: Never    Sexual activity: Not Currently       Family History   Problem Relation Age of Onset    Diabetes Mother     Hypertension Mother     Diabetes Father     Hypertension Father     Diabetes Sister     Hypertension Sister     Diabetes Brother     Hypertension Brother     Diabetes Paternal Aunt     Diabetes Paternal Uncle            CODE STATUS: Full code  I personally reviewed all of the medications.  Medication list generated using all available resources.  Elder abuse (physical)  - negative  Advanced care plan - reviewed from chart or in discussion with pt or family    Review of Systems:   No headache, eye, ear nose, throat problems; no coughing or wheezing or shortness of breath, No chest pain or orthopnea, no abdominal pain, nausea or vomiting, No pain in  the body or extremities, no psychiatric, neurological, endocrine, hematological or cardiac complaints except as noted above.     Physical Exam:   Blood pressure 137/87, pulse 70, temperature 97.8 F (36.6 C), resp. rate 20, height 1.575 m (5\' 2" ), weight 109.8 kg (242 lb), SpO2 100 %.    HEENT: Normocephalic.no carotid bruits except abrasions from lip injury  Lungs:  CTA bil  Abd Soft   Cardiac:  S1,S2, normal rate and rhythm  Neck: supple, no cartoid bruits  Extremities: no edema  Skin: no rashes seen in exposed areas     Neuro:  Level of consciousness: Lying in bed awake and alert, some odd affect, almost hypomanic like, speech is fluent smile is symmetric moving both upper and lower EXTR equally no tremor no asterixis slightly reduced range of motion in the left shoulder probably secondary to pain  Labs:     Recent Labs   Lab 04/24/21  1223 04/24/21  0828 04/24/21  0053 04/23/21  1352   Glucose 381* 367* 327* 320*   BUN 65.0* 64.0* 60.0* 57.0*   Creatinine 8.5* 8.4* 8.2* 7.6*   Calcium 8.4* 8.3* 8.4* 8.4*   Sodium 137 135 135 136   Potassium  6.3* 6.6* 5.8* 5.8*   Chloride 101 99 100 99   CO2 23 21 23 23    Albumin  --   --  2.6* 2.7*   AST (SGOT)  --   --  24 20   ALT  --   --  32 31   Bilirubin, Total  --   --  0.3 0.6   Alkaline Phosphatase  --   --  297* 279*     Recent Labs   Lab 04/24/21  0053 04/24/21  0002 04/23/21  1352   WBC 6.15 6.44 6.41   Hgb 11.3* 10.9* 11.1*   Hematocrit 37.4 35.6 36.2   MCV 94.7 94.2 94.0   MCH 28.6 28.8 28.8   MCHC 30.2* 30.6* 30.7*   Platelets 98* 94* 103*         No results for input(s): PTT, PT, INR in the last 72 hours.       Radiology Results (24 Hour)       Procedure Component Value Units Date/Time    XR Chest AP Portable [[id number]] Collected: 04/23/21 2302    Order Status: Completed Updated: 04/23/21 2305    Narrative:      Indication:? aspiration    Technique: A portable frontal view of the chest was performed.     Comparison:04/23/2021    Findings:  Left shoulder arthroplasty changes. No focal consolidation or  pleural effusion. No pneumothorax. Heart and mediastinal contours are  normal.      Impression:       No acute findings. Evaluation slightly limited due to poor  penetration.    Jeanella Cara, MD  04/23/2021 11:03 PM    CT Hip Right WO Contrast [[id number]] Collected: 04/23/21 1738    Order Status: Completed Updated: 04/23/21 1756    Narrative:      Exam:CT right hip without contrast    INDICATION:Ground-level fall    COMPARISON:None    TECHNIQUE:Axial CT images of the right hip were performed without IV  contrast.  Multiplanar reconstructions obtained.    Note that CT scanning at this site  utilizes multiple dose reduction  techniques including automatic exposure control, adjustment of the MAA  and/or KVP according to patient's size and use of iterative  reconstruction technique  FINDINGS:Osseous structures: There is mild to moderate osteopenia. The  right SI joint is intact. There are no acute fractures. Right femoral  head is seated within the acetabulum. Pubic symphysis is intact  with  chondrocalcinosis suggesting CPPD.    Soft tissues: Calcified uterine fibroid seen. Urinary bladder is  unremarkable. There is anasarca. Severe calcific arteriosclerosis.      Impression:      No acute fractures or subluxation right hip.    CPPD.    Anasarca.    Macon Large, MD  04/23/2021 5:54 PM    CT Abdomen Pelvis WO Contrast [[id number]] Collected: 04/23/21 1723    Order Status: Completed Updated: 04/23/21 1753    Narrative:      Exam:CT abdomen and pelvis without IV contrast    History: Abdominal trauma, blunt       COMPARISON: 04/13/2021    Technique: Thin section CT scan of the abdomen and pelvis without IV  contrast. Multiplanar reconstructions obtained.    Note: Note that CT scanning at this site  utilizes multiple dose  reduction techniques including automatic exposure control, adjustment of  the MAA and/or KVP according to patient's size and use of iterative  reconstruction technique    ABDOMEN FINDINGS:     LUNG BASES: Enlarged left breast with fat stranding. Please see CT chest  report dated 04/23/2021.    LIVER: Unremarkable    GALLBLADDER/Biliary Tree: Small gallstones seen in the neck of the  gallbladder measuring 4 mm. No intra or extrahepatic bile duct  dilatation.    PANCREAS: Unremarkable    SPLEEN: Unremarkable    ADRENALS: Unremarkable    KIDNEYS: Kidneys are atrophic.    VASCULATURE: Severe calcific arteriosclerosis present.    LYMPH NODES: No enlarged lymph nodes.    BOWEL: No bowl wall thickening or dilatation. Colonic diverticulosis.  There is diastases the abdominis rectus with outpouching of small bowel  loops without obstruction.    APPENDIX:No evidence of acute appendicitis.    PERITONEAL CAVITY:There is a trace of free fluid without free air.    PELVIC FINDINGS:    BLADDER: Unremarkable    REPRODUCTIVE ORGANS: Numerous calcified uterine fibroid seen.    ABDOMINAL WALL: There is anasarca. Small dot of gas is seen in the lower  anterior abdominal wall ventrally ingesting  injection site. There is an  area of ventral abdominal wall deformity and soft tissue thickening in  the left paramedian measuring 2.1 cm suggesting scarring. There is 1 cm  area of low attenuation in this area of soft tissue thickening which may  represent residual fluid. No soft tissue gas seen.    BONES: No acute or aggressive osseous findings.    OTHER:      Impression:       No solid or viscus organ injury. No free free air. Trace of  free fluid.    Anasarca.    Scarring in the left paramedian abdominal wall with a 1 cm area of low  attenuation in the subcutaneous tissues which may represent residual  fluid. A smaller than on prior exam.    Abnormal appearing left breast. Please see CT chest report dated  04/23/2021.    Cholelithiasis. Small hiatal hernia    Diastases abdominis rectus.Macon Large, MD  04/23/2021 5:51 PM    CT Chest without Contrast [[id number]] Collected: 04/23/21 1722    Order Status: Completed Updated: 04/23/21 1740    Narrative:      Examination:  CT of the chest was performed without intravenous contrast. Multiplanar  reconstructions obtained.    Clinical History:Fall yesterday with right-sided pain    Comparison:None    Note that CT scanning at this site  utilizes multiple dose reduction  techniques including automatic exposure control, adjustment of the MAA  and/or KVP according to patient's size and use of iterative  reconstruction technique    Findings:    Nodes: There is an enlarged left hilar lymph node measuring 12 mm. Is an  enlarged precarinal lymph node measuring 12 mm. There are enlarged  axillary and subpectoral lymph nodes on the left with a subpectoral  lymph nodes measuring 14 mm and left breast lymph nodes measuring up to  15 mm.    pleura: There are no pleural effusion.    Mediastinum and vasculature: There is a small hiatal hernia.    Heart: Cardiac size is normal. There is no pericardial effusion.  There  is severe calcific coronary disease.    Lungs:There is a  4 mm subpleural nodule left lower lobe image 62. There  is a 5 mm nodule in the right lower lobe image 63..There are groundglass  opacities and intralobular septal thickening suggesting mild pulmonary  edema. No pneumothorax.    Airways: Unremarkable.    Chest wall/Soft Tissues: There is enlargement of the left breast. There  are skin thickening. There is extensive stranding of the soft tissues of  the left breast. Differential diagnosis includes left breast and chest  wall contusion from trauma versus left breast neoplasm versus mastitis.  I would recommend workup at a dedicated mammography Center with  mammograms and ultrasound.    Upper abdomen: Small gallstones seen in the neck of the gallbladder  measuring 6 mm. Severe calcific arteriosclerosis present. Visualized  solid organs are otherwise unremarkable.    Bones: No acute or aggressive osseous findings.      Impression:          Groundglass opacities and interlobular septal thickening suggesting mild  to moderate pulmonary edema.    Bilateral pulmonary nodules. Follow-up as per Fleischner Society  protocol.    Enlarged left breast with skin thickening and extensive fat stranding  with subpectoral, left breast and mediastinal adenopathy. Differential  diagnosis includes left breast contusion from trauma versus neoplasm  versus mastitis. I would recommend further workup of these findings at a  dedicated [facility] with mammograms and ultrasound. Findings  were discussed with Dr. Hartford Poli 5:35 PM 05/23/2021.    Enlarged mediastinal and hilar lymph nodes. Follow-up to exclude any  myeloproliferative or neoplastic process.    Small hiatal hernia. Cholelithiasis.    Fleischner Society pulmonary nodule guidelines (revised 2017):    Multiple solid nodules less than 6 mm:  Low risk for lung cancer: No follow-up.  High risk for lung cancer: Optional chest CT in12 months.            Macon Large, MD  04/23/2021 5:38 PM    XR Pelvis Limited 1 Or 2  Views [[id number]] Collected: 04/23/21 1552    Order Status: Completed Updated: 04/23/21 1556    Narrative:      INDICATION: Trauma. Pain.    TECHNIQUE: One view.    FINDINGS: No definite fracture is demonstrated. The study is however  significantly limited by underpenetration. Additional radiographs or CT  are recommended. There is vascular calcification. There are calcified  fibroids in the central pelvis.      Impression:  No definite fracture is demonstrated. The study is however  significantly limited by underpenetration. Additional radiographs or CT  are recommended.     Steward Drone, MD  04/23/2021 3:54 PM    Ribs Right/PA Chest [[id number]] Collected: 04/23/21 1536    Order Status: Completed Updated: 04/23/21 1539    Narrative:      Indication: Fall, right body pain.    FINDINGS: Right rib series. 3 views. Osteopenia. Difficult to penetrate  the soft tissues. No fracture seen. No pleural effusion or pneumothorax.      Impression:       Limited study due to osteopenia and soft tissue density. No  fracture seen.    Jennell Corner, MD  04/23/2021 3:37 PM             All recent brain and spine imaging (MRI, CT) personally reviewed.    Chart reviewed    Case discussed with: Patient, primary care team patient's RN at the bedside minutes; >50% time spent in counseling or coordination of care        This note was generated by the Epic EMR system/Speech recognition and may contain inherent errors or omissions not intended by the user. Grammatical errors, random word insertions, deletions and pronoun errors  are occasional consequences of this technology due to software limitations.   Not all errors are caught or corrected. If there are questions or concerns about the content of this note or information contained within the body of this dictation they should be addressed directly with the author for clarification.    Signed by: Dorothea Glassman, MD, MD  Spectralink: [phone]      Answering Service: [phone]

## 2021-04-24 NOTE — Progress Notes (Addendum)
PROGRESS NOTE    Date Time: 04/24/21 9:48 AM  Patient Name: [name], [name]  Patient status: [place] Hospital Day: 0      Assessment:   Acute encephalopathy , status post RRT , found with hypoxia, head CT negative.  Status post mechanical fall , slipped in bathroom injury to lower lip, chin.  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  Hyperkalemia.  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  Fall precautions, OT PT.   Troponin , tele   Continue vancomycin at dialysis.  Calcium gluconate glucose with dextrose, SPS, nephrology consulted possible dialysis today.  Podiatry consulted for toenail, local care of chin and lip.  Oxygen as needed, pulmonary consultation requested.  Blood sugar high resume Lantus, continue sliding scale coverage.  On amlodipine Coreg, monitor blood pressure.  On Rocephin add doxycycline, continue vancomycin at dialysis for MRSA of abdominal wall.  Continue statin.  Monitor H&H and platelet count.  States she does not use CPAP at home.  DVT prophylaxis  Discharge planning when stable    Continue rest of treatment.  Discussed plan with patient and nurse  Discussed with Drs. Juliann Mule and Lazear  Discussed with Dr.Sothinathan      Subjective:   Patient seen and examined :  Patient had episode of unresponsiveness-brief RRT was called and patient was hypoxic supplemented with oxygen back to baseline oriented x3  Patient stated she had slipped in bathroom ended up on falling injured lip chin left breast  right great toe    Review of Systems:   General ROS: no fever no chills,  CVS ROS: no CP, no palpitation, no orthopnea, no PND  RESP ROS: no SOB, no cough, no sputum  GI ROS: no N/V, no diarrhea, no constipation, no melena, no hematochezia  GU ROS: no dysuria, no frequency, no hematuria, no flank pain  NEURO ROS: no focal weakness, no headache, no paraesthesia  MSK ROS: no limitation of movement, no joint pain, no swelling, no leg deformity    Medications:     Current Facility-Administered Medications   Medication Dose Route Frequency    amLODIPine  10 mg Oral Daily    atorvastatin  40 mg Oral Daily    azithromycin  500 mg Intravenous Q24H    carvedilol  25 mg Oral Q12H SCH    cefTRIAXone  1 g Intravenous Q24H    heparin (porcine)  5,000 Units Subcutaneous Q12H [place]    insulin lispro  1-3 Units Subcutaneous QHS    insulin lispro  1-5 Units Subcutaneous TID AC    methylPREDNISolone  40 mg Intravenous Q8H    senna-docusate  2 tablet Oral QHS    sevelamer  800 mg Oral BID Meals       acetaminophen **OR** acetaminophen, albuterol-ipratropium, dextrose **OR** dextrose **OR** dextrose **OR** glucagon (rDNA), dextrose **OR** dextrose **OR** dextrose **OR** glucagon (rDNA), lactulose, magnesium sulfate, melatonin, naloxone, ondansetron **OR** ondansetron, oxyCODONE-acetaminophen, polyethylene glycol    Physical Exam:   BP 139/62   Pulse  62   Temp 97.5 F (36.4 C) (Oral)   Resp 16   Ht 1.575 m (5\' 2" )   Wt 109.8 kg (242 lb)   LMP  (LMP Unknown) Comment: post menopausal  SpO2 97%   BMI 44.26 kg/m     Intake and Output Summary (Last 24 hours) at Date Time  No intake or output data in the 24 hours ending 04/24/21 0948    General: awake, alert, oriented x 3; no acute distress.  Obese    Neck : supple, no JVD  ENT: PERRLA, EOMI   Lungs: clear to auscultation bilaterally, without wheezing  Cardiovascular: regular rate and rhythm, no murmurs, rubs or gallops  Abdomen: soft nontender bowel sounds positive.  Breast, left  breast swollen   Extremities: Bilateral lower extremity erythema, right great toenail avulsion, wound  GU: no CVA tenderness   Neuro: CN II-XII intact,   No gross focal deficits     Labs:     CBC w/Diff CMP   Recent Labs   Lab 04/24/21  0053 04/24/21  0002 04/23/21  1352   WBC 6.15 6.44 6.41   Hgb 11.3* 10.9* 11.1*   Hematocrit 37.4 35.6 36.2   Platelets 98* 94* 103*   MCV 94.7 94.2 94.0   Neutrophils 79.6 75.3 70.5       PT/INR         Recent Labs   Lab 04/24/21  0828 04/24/21  0053 04/23/21  1352   Sodium 135 135 136   Potassium 6.6* 5.8* 5.8*   Chloride 99 100 99   CO2 21 23 23    BUN 64.0* 60.0* 57.0*   Creatinine 8.4* 8.2* 7.6*   Glucose 367* 327* 320*   Calcium 8.3* 8.4* 8.4*   Protein, Total  --  6.4 6.5   Albumin  --  2.6* 2.7*   AST (SGOT)  --  24 20   ALT  --  32 31   Alkaline Phosphatase  --  297* 279*   Bilirubin, Total  --  0.3 0.6      Glucose POCT   Recent Labs   Lab 04/24/21  0828 04/24/21  0053 04/23/21  1352   Glucose 367* 327* 320*              Rads:   XR Chest AP Portable    Result Date: 04/23/2021   No acute findings. Evaluation slightly limited due to poor penetration. Jeanella Cara, MD 04/23/2021 11:03 PM    CT Hip Right WO Contrast    Result Date: 04/23/2021  No acute fractures or subluxation right hip. CPPD. Anasarca. Macon Large, MD 04/23/2021 5:54 PM    CT Abdomen Pelvis WO Contrast    Result Date: 04/23/2021   No solid or viscus organ injury. No free free air. Trace of free fluid. Anasarca. Scarring in the left paramedian abdominal wall with a 1 cm area of low attenuation in the subcutaneous tissues which may represent residual fluid. A smaller than on prior exam. Abnormal appearing left breast. Please see CT chest report dated 04/23/2021. Cholelithiasis. Small hiatal hernia Diastases abdominis rectus.Macon Large, MD 04/23/2021 5:51 PM    CT Chest without Contrast    Result Date: 04/23/2021  Groundglass opacities and interlobular septal thickening suggesting mild to moderate  pulmonary edema. Bilateral pulmonary nodules. Follow-up as per Fleischner Society protocol. Enlarged left breast with skin thickening and extensive fat stranding with subpectoral, left breast and mediastinal adenopathy. Differential diagnosis includes left  breast contusion from trauma versus neoplasm versus mastitis. I would recommend further workup of these findings at a dedicated [facility] with mammograms and ultrasound. Findings were discussed with Dr. [place] Poli 5:35 PM 05/23/2021. Enlarged mediastinal and hilar lymph nodes. Follow-up to exclude any myeloproliferative or neoplastic process. Small hiatal hernia. Cholelithiasis. Fleischner Society pulmonary nodule guidelines (revised 2017): Multiple solid nodules less than 6 mm: Low risk for lung cancer: No follow-up. High risk for lung cancer: Optional chest CT in12 months. Macon Large, MD 04/23/2021 5:38 PM    XR Pelvis Limited 1 Or 2 Views    Result Date: 04/23/2021   No definite fracture is demonstrated. The study is however significantly limited by underpenetration. Additional radiographs or CT are recommended. Steward Drone, MD 04/23/2021 3:54 PM    Ribs Right/PA Chest    Result Date: 04/23/2021   Limited study due to osteopenia and soft tissue density. No fracture seen. Jennell Corner, MD 04/23/2021 3:37 PM    CT Head without Contrast    Result Date: 04/23/2021   Unremarkable noncontrast CT of the brain. Steward Drone, MD 04/23/2021 2:27 PM       Maddie Brazier Felipe Drone, MD  04/24/2021    *This note was generated by the Epic EMR system/ Dragon speech recognition and may contain inherent errors or omissions not intended by the user. Grammatical errors, random word insertions, deletions, pronoun errors and incomplete sentences are occasional consequences of this technology due to software limitations. Not all errors are caught or corrected. If there are questions or concerns about the content of this note or information contained within the body of this  dictation they should be addressed directly with the author for clarification

## 2021-04-24 NOTE — Consults (Signed)
PULM / CCM CONSULTATION    [phone]    Date Time: 04/24/21 8:44 PM  Patient Name: [name]  Requesting Physician: Barbaraann Faster, MD    Asked to consult by Barbaraann Faster, MD   Reason for Consultation:     Syncope  Obstructive sleep apnea    Assessment:   Obstructive sleep apnea  Pulmonary atelectasis  Mediastinal hilar and axillary adenopathy  Multiple pulmonary nodules subcentimeter  Leg edema with redness and tenderness of the left calf rule out DVT  Syncope etiology unclear rule out pulmonary embolism  Left breast swelling  End-stage renal disease  Diabetes mellitus  Hypertension  Hyperlipidemia    Plan:     Place patient on CPAP patient has a CPAP at home which she has not had set up yet  Multiple pulmonary nodules are subcentimeter follow-up CT scan in 1 year  Dialysis as per nephrology  Syncope work-up as per cardiology and neurology  CT angiogram of the chest to rule out pulmonary embolism  Venous Doppler lower extremity rule out DVT  Glycemic control as per primary team  History:         [name] is a 66 y.o. female who presents to the hospital on 04/23/2021 with history of COPD diabetes mellitus sleep apnea has a CPAP at home which has not been set up yet brought into the hospital after she had a fall.  She did not have any fractures when she was in the hospital she also had a loss of consciousness at that time was felt to have cardiac arrest but she regained consciousness within less than a minute which was questionable whether she truly had a cardiac arrest currently patient is alert and awake and oriented.  She does have obstructive sleep apnea.  During the exam she was found to have left breast swelling without any masses.  Patient has been worked up for altered mental status and syncope.  She was also found to have lower extremity swelling with tenderness of the left leg.        Past Medical History:     Past Medical History:   Diagnosis Date    Chronic obstructive pulmonary  disease     Diabetes mellitus     Hyperlipidemia     Hypertension     Sleep apnea 2018       Past Surgical History:     Past Surgical History:   Procedure Laterality Date    EGD, BIOPSY N/A 05/14/2019    Procedure: EGD, BIOPSY;  Surgeon: Ronie Spies, MD;  Location: [facility];  Service: Gastroenterology;  Laterality: N/A;    EXPLORATORY LAPAROTOMY N/A 05/27/2019    Procedure: EXPLORATORY LAPAROTOMY;  Surgeon: Herbert Moors., MD;  Location: [facility];  Service: General;  Laterality: N/A;    JOINT REPLACEMENT      shoulder    JOINT REPLACEMENT      knee    OVARY SURGERY Left     Removal    REMOVAL, FOREIGN BODY N/A 05/27/2019    Procedure: REMOVAL, PERITONEAL DIALYSIS CATHETER;  Surgeon: Herbert Moors., MD;  Location: [facility];  Service: General;  Laterality: N/A;    TUNNELED CATH CHECK/CHANGE (PERMCATH) N/A 07/03/2019    Procedure: TUNNELED CATH CHECK/CHANGE;  Surgeon: Maureen Ralphs, MD;  Location: [facility];  Service: Interventional Radiology;  Laterality: N/A;    TUNNELED CATH PLACEMENT (PERMCATH) Bilateral 05/23/2019    Procedure: TUNNELED CATH PLACEMENT;  Surgeon: Papadouris, Dimitrios  C, MD;  Location: [facility];  Service: Interventional Radiology;  Laterality: Bilateral;    TUNNELED CATH PLACEMENT ([facility]MCATH) Right 07/31/2019    Procedure: TUNNELED CATH PLACEMENT;  Surgeon: Jacques Earthly, MD;  Location: [facility];  Service: Interventional Radiology;  Laterality: Right;       Family History:     Family History   Problem Relation Age of Onset    Diabetes Mother     Hypertension Mother     Diabetes Father     Hypertension Father     Diabetes Sister     Hypertension Sister     Diabetes Brother     Hypertension Brother     Diabetes Paternal Aunt     Diabetes Paternal Uncle        Social History:     Social History     Socioeconomic History    Marital status: Legally Separated     Spouse name: Not on file    Number of children: Not on file    Years of education: Not on file    Highest  education level: Not on file   Occupational History    Not on file   Tobacco Use    Smoking status: Never    Smokeless tobacco: Never   Vaping Use    Vaping status: Never Used   Substance and Sexual Activity    Alcohol use: Never    Drug use: Never    Sexual activity: Not Currently   Other Topics Concern    Not on file   Social History Narrative    Not on file     Social Determinants of Health     Financial Resource Strain: Not on file   Food Insecurity: Not on file   Transportation Needs: Not on file   Physical Activity: Not on file   Stress: Not on file   Social Connections: Not on file   Intimate Partner Violence: Not on file   Housing Stability: Not on file       Allergies:     Allergies   Allergen Reactions    Lisinopril Hives    Metformin Hives       Hospital Medications:     Current Facility-Administered Medications   Medication Dose Route Frequency    amLODIPine  10 mg Oral Daily    atorvastatin  40 mg Oral Daily    azithromycin  500 mg Intravenous Q24H    carvedilol  25 mg Oral Q12H SCH    cefTRIAXone  1 g Intravenous Q24H    dextrose  50 g Intravenous Once    doxycycline  100 mg Oral Q12H SCH    gabapentin  100 mg Oral Q8H Abanda    heparin (porcine)  5,000 Units Subcutaneous Q12H [place]    insulin glargine  30 Units Subcutaneous QHS    insulin lispro  1-3 Units Subcutaneous QHS    insulin lispro  1-5 Units Subcutaneous TID AC    methylPREDNISolone  40 mg Intravenous Q8H    senna-docusate  2 tablet Oral QHS    sevelamer  800 mg Oral BID Meals    [START ON 04/25/2021] vancomycin   Intravenous See Admin Instructions       Home Medications:     Medications Prior to Admission   Medication Sig Dispense Refill Last Dose    acetaminophen (TYLENOL) 500 MG tablet Take 500 mg by mouth every 6 (six) hours as needed for Pain  albuterol sulfate HFA (PROVENTIL) 108 (90 Base) MCG/ACT inhaler Inhale 2 puffs into the lungs every 6 (six) hours as needed for Wheezing or Shortness of Breath       amLODIPine (NORVASC) 10 MG  tablet Take 1 tablet by mouth daily       aspirin 81 MG chewable tablet Chew 81 mg by mouth daily       atorvastatin (LIPITOR) 40 MG tablet Take 1 tablet (40 mg) by mouth daily 30 tablet 0     carvedilol (COREG) 25 MG tablet Take 1 tablet (25 mg) by mouth every 12 (twelve) hours 60 tablet 0     dextromethorphan-guaiFENesin (MUCINEX DM) 30-600 MG per 12 hr tablet Take 1 tablet by mouth every 12 (twelve) hours 14 tablet 1     gabapentin (NEURONTIN) 100 MG capsule Take 1 capsule (100 mg) by mouth every 8 (eight) hours 30 capsule 0     insulin aspart (NovoLOG) 100 UNIT/ML injection Inject 15 Units into the skin 3 (three) times daily before meals       Insulin Degludec (TRESIBA SC) Inject 35 Unit into the skin       lactobacillus/streptococcus (RISAQUAD) Cap Take 1 capsule by mouth daily 30 capsule 0     mupirocin (BACTROBAN) 2 % ointment Apply topically daily USe BID for 2 Weeks 15 g 0     oxyCODONE-acetaminophen (PERCOCET) 5-325 MG per tablet Take 1 tablet by mouth every 4 (four) hours as needed for Pain 15 tablet 0     pantoprazole (PROTONIX) 40 MG tablet Take 40 mg by mouth daily       petrolatum (AQUAPHOR) ointment Apply topically every 12 (twelve) hours 30 g 0     sevelamer (RENVELA) 800 MG tablet Take 800 mg by mouth 2 (two) times daily with meals          Code Status:      full code    Review of Systems:       General ROS:  Afebrile no weight loss weight gain  ENT ROS:  No sore throat no nasal discharge  Endocrine ROS:  No fatigue  Respiratory ROS:  No shortness of breath wheezing cough chest congestion   Cardiovascular ROS: Syncope  Gastrointestinal ROS:  No nausea vomiting diarrhea.  No melanotic stool  Genito-Urinary ROS:  No burning in the urine or hematuria  Musculoskeletal ROS: Leg edema with tenderness  Neurological ROS: Sleep apnea  Dermatological ROS:  No skin rash      Physical Exam:     Vitals:    04/24/21 1922   BP: 175/77   Pulse: 80   Resp: (!) 27   Temp: 97.8 F (36.6 C)   SpO2: 97%       No  intake or output data in the 24 hours ending 04/24/21 2044        General appearance - no visible respiratory distress patient does not appear toxic  Mental status - Alert and oriented x3  Eyes - EOMI PERRLA  Nose - no nasal discharge  Mouth - mucous membrane is moist.  No evidence of sore throat  Neck - no JVD lymphadenopathy or thyromegaly  Chest - clear to auscultation  Heart - S1-S2 RRR no S3-S4 no murmur  Abdomen - soft nontender bowel sounds are normal with no hepatosplenomegaly  Neurological - no motor or sensory deficit  Extremities -bilateral lower extremity edema left leg also has redness of the skin with tenderness  Skin - no skin rash  Capillary refill time less than 3 seconds  Labs:       CBC w/Diff CMP   Recent Labs   Lab 04/24/21  0053 04/24/21  0002 04/23/21  1352   WBC 6.15 6.44 6.41   Hgb 11.3* 10.9* 11.1*   Hematocrit 37.4 35.6 36.2   Platelets 98* 94* 103*   MCV 94.7 94.2 94.0   Neutrophils 79.6 75.3 70.5       PT/INR         Recent Labs   Lab 04/24/21  1223 04/24/21  0828 04/24/21  0053 04/23/21  1352   Sodium 137 135 135 136   Potassium 6.3* 6.6* 5.8* 5.8*   Chloride 101 99 100 99   CO2 23 21 23 23    BUN 65.0* 64.0* 60.0* 57.0*   Creatinine 8.5* 8.4* 8.2* 7.6*   Glucose 381* 367* 327* 320*   Calcium 8.4* 8.3* 8.4* 8.4*   Protein, Total  --   --  6.4 6.5   Albumin  --   --  2.6* 2.7*   AST (SGOT)  --   --  24 20   ALT  --   --  32 31   Alkaline Phosphatase  --   --  297* 279*   Bilirubin, Total  --   --  0.3 0.6      Glucose POCT   Recent Labs   Lab 04/24/21  1223 04/24/21  0828 04/24/21  0053 04/23/21  1352   Glucose 381* 367* 327* 320*        Recent Labs   Lab 04/24/21  1834   hs Troponin-I 58.6*             ABGs:    ABG CollectionSite   Date Value Ref Range Status   04/23/2021 Right Radl  Final     Allen's Test   Date Value Ref Range Status   04/23/2021 Yes  Final     pH, Arterial   Date Value Ref Range Status   04/23/2021 7.242 (L) 7.350 - 7.450 Final     pCO2, Arterial   Date Value Ref  Range Status   04/23/2021 59.5 (H) 35.0 - 45.0 mmhg Final     pO2, Arterial   Date Value Ref Range Status   04/23/2021 <42.7 (L) 80.0 - 90.0 mmhg Final     Comment:     Critical pO2 results called to [id number].  Readback confirmed by MM at  04/23/2021  22:37       HCO3, Arterial   Date Value Ref Range Status   04/23/2021 24.7 23.0 - 29.0 mEq/L Final     Base Excess, Arterial   Date Value Ref Range Status   04/23/2021 -3.0 (L) -2.0 - 2.0 mEq/L Final     O2 Sat, Arterial   Date Value Ref Range Status   04/23/2021 45.3 (L) 95.0 - 100.0 % Final       Urinalysis        Invalid input(s): LEUKOCYTESUR      Rads:     Radiology Results (24 Hour)       Procedure Component Value Units Date/Time    CT Angiogram Chest [[id number]] Collected: 04/24/21 2013    Order Status: Completed Updated: 04/24/21 2033    Narrative:      CLINICAL INFORMATION: PE    TECHNIQUE: CT angiogram of the chest. Images were obtained after bolus  administration of intravenous contrast. In addition to routine coronal  and sagittal reformations, post  processed MIP angiographic  reconstructions appropriate for analysis of the pulmonary arteries were  generated.    CONTRAST DOSE: 100 mL Omnipaque 350    COMPARISON: Chest CT on 04/23/2021      CTA FINDINGS:  Pulmonary Arteries  Image quality: Satisfactory assessment of main and lobar pulmonary  arteries. Limited assessment of segmental and  subsegmental pulmonary  arteries.     Negative for acute pulmonary embolism.    RV/LV ratio in the setting of PE (axial): NA      CT FINDINGS:  THYROID GLAND: Normal.    MEDIASTINUM, PULMONARY HILA AND AXILLAE: Limited due to phase of IV  contrast. Redemonstration of mildly enlarged mediastinal and hilar lymph  nodes including Station 5, Station 4R, Station 10 R and 10 mL. Unchanged  enlarged left axillary and subpectoral nodes.    CARDIOVASCULAR: Unchanged mild cardiomegaly and calcifications of  coronary arteries.. No pericardial effusion. The ascending aorta and  main  pulmonary artery are normal in caliber.    TRACHEA AND AIRWAYS: Central airways are patent and clear.    LUNGS AND PLEURA: Worsening in aeration of both lungs with increase in  bilateral dependent atelectasis. Ongoing septal wall thickening  suggestive of volume overload/pulmonary edema. Similar pattern of mosaic  attenuation essentially involving all lobes that can be seen with small  or reactive airways disease. Likely new trace right pleural effusion.  Suboptimal for pulmonary nodule evaluation.    UPPER ABDOMEN: No acute findings within visualized upper abdomen.    SOFT TISSUES: Significant and soft tissue stranding with overlying skin  thickening of the left chest wall/breast have somewhat improved.    BONES: No aggressive osseous lesion. Left shoulder arthroplasty.        Impression:        1.Negative for acute pulmonary embolism.  2.Worsening aeration of both lungs with increase in bilateral dependent  atelectasis. Ongoing septal wall thickening suggestive of volume  overload/pulmonary edema. Likely new trace right pleural effusion.  3.Subtle improvement but significant residual soft tissue stranding and  skin thickening in the left chest wall/breast.  4.Unchanged mediastinal, hilar, and left axillary lymphadenopathy.          Nelya Ebadirad  04/24/2021 8:31 PM    CT Head WO Contrast [[id number]] Collected: 04/24/21 1812    Order Status: Completed Updated: 04/24/21 1818    Narrative:      CLINICAL INFORMATION: syncope    TECHNIQUE: CT of the head without contrast.    All CT scans are performed using one of these three dose reduction  techniques: automated exposure control, adjustment of the mA and/or kV  according to patient size, or use of iterative reconstruction  techniques.    COMPARISON: Head CT 04/23/2021    FINDINGS:     INTRACRANIAL:  HEMORRHAGE: No acute hemorrhage.  VENTRICLES: The ventricles and subarachnoid spaces are proportionately  enlarged reflecting volume loss.         INFARCT: No acute  territorial infarct.  BRAIN: Mild periventricular hypodensity is expected for age. No mass or  mass effect.  BASAL CISTERNS: Normal.  OTHER: Intracranial vascular calcifications.    EXTRACRANIAL:  SKULL: No suspicious osseous lesions.  SINUSES: The imaged paranasal sinuses and mastoids are clear.  OTHER: Left lens replacement.      Impression:         No acute intracranial abnormality.     Nelya Ebadirad  04/24/2021 6:16 PM    XR Chest AP Portable [[id number]] Collected: 04/23/21 2302  Order Status: Completed Updated: 04/23/21 2305    Narrative:      Indication:? aspiration    Technique: A portable frontal view of the chest was performed.     Comparison:04/23/2021    Findings:  Left shoulder arthroplasty changes. No focal consolidation or  pleural effusion. No pneumothorax. Heart and mediastinal contours are  normal.      Impression:       No acute findings. Evaluation slightly limited due to poor  penetration.    Jeanella Cara, MD  04/23/2021 11:03 PM        .    Quillian Quince MD  04/24/2021  8:44 PM

## 2021-04-24 NOTE — Respiratory Progress Note (Signed)
Respiratory Therapy                              Patient Re-Assessment Note/Protocol Order Changes    Patient has been assessed and re-evaluated for the follow therapies:       Respiratory Orders   (From admission, onward)                 Start     Ordered    04/24/21 2100  Home CPAP  QHS (RT)       04/23/21 2246    04/23/21 2255  Nebulizer treatment intermittent  Every 6 hours scheduled (RT)      Comments:   All Adult patients ordered for Respiratory Therapy, i.e., inhaled meds, secretion clearance/lung expansion or Oxygen greater than 5 liters/min will be evaluated by a Respiratory Therapist and assessed per Respiratory Therapy Patient Driven Protocol.  Initial assessment and changes made per protocol can be found in the progress note section of the patient chart.    04/23/21 2254    04/23/21 2255  Resp Re-Assess Adult (RT Use Only)  2 times daily (RT)       04/23/21 2254                  IP Meds - Nasal and Inhaled (From admission, onward)      Start     Stop Status Route Frequency Ordered    04/24/21 0200  albuterol-ipratropium (DUO-NEB) 2.5-0.5(3) mg/3 mL nebulizer 3 mL         -- Dispensed NEBULIZATION RT - Every 6 hours scheduled 04/23/21 2243                 Current Criteria For Therapy  Secretion Clearance: None indicated  Lung Expansion: None indicated  Medications: Hx of COPD, Asthma, Bronchitis or other documented RAD    Expected Outcomes  Secretion: Improved breath sound   Lung Expansion: Improved air movement at the bases   Meds: Return to baseline home regimen    Outcomes Met  Secretion: No  Lung Expansion: No  Meds: Yes       Reassessment Recommendations  Recommendations: Decrease current treatment plan to PRN nebs / home regimen. BS clear.    Patient's orders have been modified as follows per RT Patient Driven Protocol.    If any questions, please contact the Respiratory Therapist assigned to this patient    Thank you

## 2021-04-24 NOTE — Respiratory Progress Note (Signed)
Respiratory Therapy Patient Assessment    [id number]/A2817-A  04/24/21 12:48 AM  RT: First M Last, RT      Admitting DX: Altered mental status [R41.82]    Pulmonary History: COPD, Sleep Apnea    Other Pulm Hx:      Therapy ordered:       Respiratory Orders   (From admission, onward)                 Start     Ordered    04/24/21 2100  Home CPAP  QHS (RT)       04/23/21 2246    04/23/21 2255  Nebulizer treatment intermittent  Every 6 hours scheduled (RT)      Comments:   All Adult patients ordered for Respiratory Therapy, i.e., inhaled meds, secretion clearance/lung expansion or Oxygen greater than 5 liters/min will be evaluated by a Respiratory Therapist and assessed per Respiratory Therapy Patient Driven Protocol.  Initial assessment and changes made per protocol can be found in the progress note section of the patient chart.    04/23/21 2254    04/23/21 2255  Resp Re-Assess Adult (RT Use Only)  2 times daily (RT)       04/23/21 2254                   IP Meds - Nasal and Inhaled (From admission, onward)      Start     Stop Status Route Frequency Ordered    04/24/21 0200  albuterol-ipratropium (DUO-NEB) 2.5-0.5(3) mg/3 mL nebulizer 3 mL         -- Verified NEBULIZATION RT - Every 6 hours scheduled 04/23/21 2243               PT able to take deep breath? Yes            Surgical Status: None  Airway: Natural   Mobility: Ambulatory with or without assistance  CXR: no acute abnormal showed    Cough Effort: Moderate            Can clear secretions with cough? Yes  Can clear secretions with suctioning? No     Social History     Tobacco Use   Smoking Status Never   Smokeless Tobacco Never   Vaping Use   Vaping Status Never Used        Breath Sounds:  Bilateral Breath Sounds: Diminished          Heart Rate: 62 Resp Rate: 12  SpO2: 98 % O2 Device: Nasal cannula     O2 Flow Rate (L/min): 2 L/min    Home regimen:  Home Treatments: albuterol neb q6 prn  Home Oxygen: no   Home CPAP/BiLevel: use to use cpap but not lately    Criteria  for therapy:  Secretion Clearance: None indicated  Lung Expansion: None indicated  Medications: Hx of COPD, Asthma, Bronchitis or other documented RAD    Recommendations/Interventions:  Recommendations/Interventions: Bronchodilators     Expected Outcomes:  Secretion: Improved breath sound  Lung Expansion: Improved air movement at the bases  Meds: Return to baseline home regimen      Re-Evaluation:  Follow-up Date:   Improving with Therapy: N/A    Plan of Care Recommendations:  Plan of Care: continue current tx plan

## 2021-04-24 NOTE — UM Notes (Addendum)
[name]  Female, 66 y.o., 09/08/1955  MRN: [id number]    Admit to Observation (Order [id number])  Admission  Date: 04/23/2021 Department: [facility] Ordering/Authorizing: Hartford Poli, MD       ED Diagnosis    ED Diagnosis   Diagnosis Comment Associated Orders   Final diagnosis   Altered mental status       66 y.o. female presenting to the ED with altered mental status. Patient had fall yesterday with head trauma. Was found today by husband altered, confused, off her baseline. She has ESRD history, getting dialysis as normal, no missed sessions. Patient notes right sided chest/hip pain after fall. Unsure about head trauma. Otherwise, patient not answering questions appropriately. She is sleeping, rouses somewhat with stimulation, but is lethargic on waking, confused, poor memory. Unsure what led to fall, head trauma.   Past Medical History:   Diagnosis Date    Chronic obstructive pulmonary disease     Diabetes mellitus     Hyperlipidemia     Hypertension     Sleep apnea 2018          98 F (36.7 C) Oral 74 86 % (Abnormal)   -- 11 (Abnormal)   210/102         Hgb 11.1 (*)       Platelets 103 (*)       RBC 3.85 (*)       MCHC 30.7 (*)       All other components within normal limits   COMPREHENSIVE METABOLIC PANEL - Abnormal; Notable for the following components:     Glucose 320 (*)       BUN 57.0 (*)       Creatinine 7.6 (*)       Potassium 5.8 (*)       Calcium 8.4 (*)       Albumin 2.7 (*)       Alkaline Phosphatase 279 (*)       Globulin 3.8 (*)       Albumin/Globulin Ratio 0.7 (*)       All other components within normal limits   GLUCOSE WHOLE BLOOD - POCT - Abnormal; Notable for the following components:     Whole Blood Glucose POCT 275 (*)       All other components within normal limits   GLUCOSE WHOLE BLOOD - POCT - Abnormal; Notable for the following components:     Whole Blood Glucose POCT 257 (*)       All other components within normal limits   GLUCOSE WHOLE BLOOD -  POCT - Abnormal; Notable for the following components:     Whole Blood Glucose POCT 332 (*)       EGFR 6.5        CT Chest without Contrast (Final result)  Result time 04/23/21 17:38:36  Final result by Macon Large., MD (04/23/21 17:38:36)                Impression:        Groundglass opacities and interlobular septal thickening suggesting mild   to moderate pulmonary edema.     Bilateral pulmonary nodules. Follow-up as per Fleischner Society   protocol.     Enlarged left breast with skin thickening and extensive fat stranding   with subpectoral, left breast and mediastinal adenopathy. Differential   diagnosis includes left breast contusion from trauma versus neoplasm   versus mastitis. I would recommend further workup of these findings at  a   dedicated [place] with mammograms and ultrasound. Findings   were discussed with Dr. Hartford Poli 5:35 PM 05/23/2021.     Enlarged mediastinal and hilar lymph nodes. Follow-up to exclude any   myeloproliferative or neoplastic process.     Small hiatal hernia. Cholelithiasis.     Fleischner Society pulmonary nodule guidelines (revised 2017):     Multiple solid nodules less than 6 mm:   Low risk for lung cancer: No follow-up.   High risk for lung cancer: Optional chest CT in12 months.             Macon Large, MD   04/23/2021 5:38 PM                  ER MEDS     04/23/2021 1432 EDT calcium GLUConate 10 % injection 1 g 1 g Intravenous Given Christin Fudge, RN --    04/23/2021 1557 EDT dextrose (D10W) 10% bolus 500 mL 0 mL Intravenous Stopped Balog, [place], RN --    04/23/2021 1433 EDT dextrose (D10W) 10% bolus 500 mL 500 mL Intravenous [address] [place], RN --    04/23/2021 1434 EDT insulin regular (HumuLIN R) 5 Units intravenous injection 5 Units Intravenous Given Balog, [place], RN --    04/23/2021 1536 EDT hydrALAZINE (APRESOLINE) injection 10 mg 10 mg Intravenous Given Balog, [place], RN --         H&P  CC: Fall, mental status  change     Assessment and Plan:       #.  Altered mental status  -Unclear etiology.  Patient drowsy, however answers appropriately.  Negative CT scan of the head     #.  Recent admission for abdominal wall abscess at the PD catheter site with MRSA  -Patient currently getting vancomycin with dialysis.  Continue the same.     #.  Hyperkalemia  -Received insulin D50 in the ER  Will repeat potassium levels     #.  Status post fall with injury to left chest right great toe  -CT showing left breast contusion  .  Would evaluation     #.  Bilateral pulmonary nodules  -Pulmonary follow-up     #.  ESRD-DD MWF  -Nephrology evaluation in a.m.     #.  Type 2 diabetes mellitus  Start on sliding scale insulin before meals and at bedtime     #.  Hypertension  Continue Coreg     #.  Hyperlipidemia  Continue Lipitor 40 mg daily     PODIATRY CONSULT -NOTE PENDING      #. DVT prophylaxis: Continue heparin            Culture and Gram Stain, Aerobic, Wound     FINAL       04/17/21 20:31   +  04/15/21   Heavy growth of Staphylococcus aureus         04/24/21 1125 97.7 F (36.5 C) -- 64 12 152/97 Abnormal  95 % -- --   04/24/21 0813 -- -- 62 -- 139/62 -- -- --   04/24/21 0754 -- -- 60 16 -- 97 % 2 L/min        Basic Metabolic Panel [[id number]] (Abnormal) Collected: 04/24/21 1223   Specimen: Blood Updated: 04/24/21 1253    Glucose 381 High  mg/dL     BUN 65.0 High  mg/dL     Creatinine 8.5 High  mg/dL     Calcium 8.4 Low  mg/dL  Potassium 6.3 High Panic   mEq/L        Glucose Whole Blood - POCT [[id number]] (Abnormal) Collected: 04/24/21 1240    Updated: 04/24/21 1241    Whole Blood Glucose POCT 290 High  mg/dL    Glucose Whole Blood - POCT [[id number]] (Abnormal) Collected: 04/24/21 1123    Updated: 04/24/21 1138    Whole Blood Glucose POCT 364 High  mg/dL    Basic Metabolic Panel [[id number]] (Abnormal) Collected: 04/24/21 0828   Specimen: Blood Updated: 04/24/21 0938    Glucose 367 High  mg/dL     BUN 64.0 High  mg/dL     Creatinine 8.4  High  mg/dL     Calcium 8.3 Low  mg/dL     Potassium 6.6 High Panic   mEq/L        Updated: 04/24/21 0720        Whole Blood Glucose POCT 311 High  mg/dL    Comprehensive metabolic panel [[id number]] (Abnormal) Collected: 04/24/21 0053   Specimen: Blood Updated: 04/24/21 0130    Glucose 327 High  mg/dL     BUN 60.0 High  mg/dL     Creatinine 8.2 High  mg/dL     Potassium 5.8 High  mEq/L     Calcium 8.4 Low  mg/dL     Albumin 2.6 Low  g/dL     Alkaline Phosphatase 297 High  U/L     Globulin 3.8 High  g/dL     Albumin/Globulin Ratio 0.7 Low        CBC and differential [[id number]] (Abnormal) Collected: 04/24/21 0053    Hgb 11.3 Low  g/dL     Platelets 98 Low  x10 3/uL     MCHC 30.2 Low  g/dL     MPV 12.9 High  fL      CBC and differential [[id number]] (Abnormal) Collected: 04/24/21 0002    Hgb 10.9 Low  g/dL     Platelets 94 Low  x10 3/uL     RBC 3.78 Low  x10 6/uL     MCHC 30.6 Low  g/dL     MPV 12.9 High  fL    sodium polystyrene (KAYEXALATE) 15 GM/60ML suspension 30 g  Dose: 30 g  Freq: Once Route: PO  Start: 04/24/21 1145 End: 04/24/21 1209    sodium polystyrene (KAYEXALATE) 15 GM/60ML suspension 30 g  Dose: 30 g  Freq: Once Route: PO  Start: 04/24/21 0530 End: 04/24/21 0555    methylPREDNISolone sodium succinate (Solu-MEDROL) injection 40 mg  Dose: 40 mg  Freq: Every 8 hours Route: IV      4/2 neph consult    Assessment:          Patient Active Problem List   Diagnosis    Iron deficiency anemia secondary to inadequate dietary iron intake    Sideropenic dysphagia    Peritoneal dialysis catheter dysfunction, initial encounter    Peritonitis    Upper GI bleed    ESRD (end stage renal disease) on dialysis    Generalized abdominal pain    Thrombocytopenia    h/o orthostatic hypotension    Type 2 diabetes mellitus, with long-term current use of insulin    GERD (gastroesophageal reflux disease)    Bowel incontinence    Right sided numbness    Uncontrolled type 2 diabetes mellitus with hyperglycemia    Hypertension     Hyperlipidemia    History of COPD    Gastritis with hemorrhage    History  of anemia due to chronic kidney disease    Open wound, abdominal wall, lateral, subsequent encounter    Gout    Dizziness    Fall    History of CVA (cerebrovascular accident)    Pneumonia due to COVID-19 virus    Diarrhea    Hypertensive urgency    Shortness of breath    ESRD (end stage renal disease)    Hyperkalemia    Pulmonary edema    Hypervolemia    Elevated troponin    Chest pain    Nausea vomiting and diarrhea    Breast pain, left    Syncope    Hypertensive emergency    Volume overload    Altered mental status         ESRD on HD MWF at [facility] - followed by Dr. Randel Pigg  Hyperkalemia due to missed HD & hyperglycemia- last HD on 3/29  Mild hypocalcemia due to CKD  Anemia in CKD, hgb at goal  HTN - controlled  DM type 2 with hyperglycemia  Hyperphosphatemia: PTH 1579 on 3/23  Secondary hyperparathyroidism  Fall   Abdominal wall abscess MRSA on vancomycin      Recommendations:      HD today, 2hr, due to hyperkalemia  Resume HD tomorrow to continue routine HD schedule  Resume OP meds   Vanc per level     4/2 neuro consult    Assessment & Plan:   Episode of syncope short lasting, in the setting of hypoxia with O2 sat in the 50s.-Most likely hypoxia induced--  We will go ahead and repeat CT scan of the head  Continue to follow clinically  Hold off on further testing for now given event seems to be clearly induced by hypoxia especially if CT is negative     04/24/21 1645 97.7 F (36.5 C) -- 72 48 Abnormal  171/75 98 % -- --   04/24/21 1615 97.7 F (36.5 C) -- 69 60 Abnormal  130/69 100 %           Tyquan Carmickle Azerba[place] RN, BSN, ACM-RN, CCM  Utilization Review RN Case Manager [facility]  Utilization Review Department  [address] [address] [place]  [place], [place] [address]  T [phone] (Confidential voice mail) C [phone] (Confidential voice mail)   Jerilynn Mages [phone] F [phone]  [email or web address]

## 2021-04-24 NOTE — Consults (Signed)
[facility]  CONSULT  Aaron Edelman, x [phone] [place] Spectralink)      Date Time: 04/24/21 4:25 PM  Patient Name: [name]  Requesting Physician: Barbaraann Faster, MD  Consulting Physician: Verl Blalock, MD, MD    Primary Care Physician: Horton Marshall, MD    Reason for Consultation: ESRD, provision of hemodialysis       Assessment:     Patient Active Problem List   Diagnosis    Iron deficiency anemia secondary to inadequate dietary iron intake    Sideropenic dysphagia    Peritoneal dialysis catheter dysfunction, initial encounter    Peritonitis    Upper GI bleed    ESRD (end stage renal disease) on dialysis    Generalized abdominal pain    Thrombocytopenia    h/o orthostatic hypotension    Type 2 diabetes mellitus, with long-term current use of insulin    GERD (gastroesophageal reflux disease)    Bowel incontinence    Right sided numbness    Uncontrolled type 2 diabetes mellitus with hyperglycemia    Hypertension    Hyperlipidemia    History of COPD    Gastritis with hemorrhage    History of anemia due to chronic kidney disease    Open wound, abdominal wall, lateral, subsequent encounter    Gout    Dizziness    Fall    History of CVA (cerebrovascular accident)    Pneumonia due to COVID-19 virus    Diarrhea    Hypertensive urgency    Shortness of breath    ESRD (end stage renal disease)    Hyperkalemia    Pulmonary edema    Hypervolemia    Elevated troponin    Chest pain    Nausea vomiting and diarrhea    Breast pain, left    Syncope    Hypertensive emergency    Volume overload    Altered mental status       ESRD on HD MWF at [facility] - followed by Dr. Randel Pigg  Hyperkalemia due to missed HD & hyperglycemia- last HD on 3/29  Mild hypocalcemia due to CKD  Anemia in CKD, hgb at goal  HTN - controlled  DM type 2 with hyperglycemia  Hyperphosphatemia: PTH 1579 on 3/23  Secondary hyperparathyroidism  Fall   Abdominal wall abscess MRSA on vancomycin     Recommendations:     HD today, 2hr,  due to hyperkalemia  Resume HD tomorrow to continue routine HD schedule  Resume OP meds   Vanc per level         Barbaraann Faster, MD, thank you for this consultation.  We will follow the patient with you during this hospitalization.  Please contact me with any questions or issues.    Signed by: Verl Blalock, MD, MD  [facility]  [phone] (office)  x [phone] ([place])      -----------------------------------------------------------------------------------------------------------        History of Presenting Illness:   [name] is a 66 y.o. female with ESRD on HD MWF, HTN, DM type 2, abdominal wall abscess on vancomucin who presented to the hospital with after a mechanical fall, admitted for further evaluation of AMS. Nephrology consulted for provision of hemodialysis.     Patient reports missing HD on Friday. Found to be hyperkalemic for which we plan for 2hr HD today.     Past Medical History:     Past Medical History:   Diagnosis Date    Chronic  obstructive pulmonary disease     Diabetes mellitus     Hyperlipidemia     Hypertension     Sleep apnea 2018       Past Surgical History:     Past Surgical History:   Procedure Laterality Date    EGD, BIOPSY N/A 05/14/2019    Procedure: EGD, BIOPSY;  Surgeon: Ronie Spies, MD;  Location: [facility];  Service: Gastroenterology;  Laterality: N/A;    EXPLORATORY LAPAROTOMY N/A 05/27/2019    Procedure: EXPLORATORY LAPAROTOMY;  Surgeon: Herbert Moors., MD;  Location: [facility];  Service: General;  Laterality: N/A;    JOINT REPLACEMENT      shoulder    JOINT REPLACEMENT      knee    OVARY SURGERY Left     Removal    REMOVAL, FOREIGN BODY N/A 05/27/2019    Procedure: REMOVAL, PERITONEAL DIALYSIS CATHETER;  Surgeon: Herbert Moors., MD;  Location: [facility];  Service: General;  Laterality: N/A;    TUNNELED CATH CHECK/CHANGE (PERMCATH) N/A 07/03/2019    Procedure: TUNNELED CATH CHECK/CHANGE;  Surgeon: Maureen Ralphs, MD;  Location: [facility];  Service: Interventional Radiology;  Laterality: N/A;    TUNNELED CATH PLACEMENT (PERMCATH) Bilateral 05/23/2019    Procedure: TUNNELED CATH PLACEMENT;  Surgeon: Jacques Earthly, MD;  Location: [facility];  Service: Interventional Radiology;  Laterality: Bilateral;    TUNNELED CATH PLACEMENT (PERMCATH) Right 07/31/2019    Procedure: TUNNELED CATH PLACEMENT;  Surgeon: Jacques Earthly, MD;  Location: [facility];  Service: Interventional Radiology;  Laterality: Right;       Family History:     Family History   Problem Relation Age of Onset    Diabetes Mother     Hypertension Mother     Diabetes Father     Hypertension Father     Diabetes Sister     Hypertension Sister     Diabetes Brother     Hypertension Brother     Diabetes Paternal Aunt     Diabetes Paternal Uncle        Social History:     Social History     Socioeconomic History    Marital status: Legally Separated     Spouse name: Not on file    Number of children: Not on file    Years of education: Not on file    Highest education level: Not on file   Occupational History    Not on file   Tobacco Use    Smoking status: Never    Smokeless tobacco: Never   Vaping Use    Vaping status: Never Used   Substance and Sexual Activity    Alcohol use: Never    Drug use: Never    Sexual activity: Not Currently   Other Topics Concern    Not on file   Social History Narrative    Not on file     Social Determinants of Health     Financial Resource Strain: Not on file   Food Insecurity: Not on file   Transportation Needs: Not on file   Physical Activity: Not on file   Stress: Not on file   Social Connections: Not on file   Intimate Partner Violence: Not on file   Housing Stability: Not on file       Allergies:     Allergies   Allergen Reactions    Lisinopril Hives    Metformin Hives  Medications:     Medications Prior to Admission   Medication Sig    acetaminophen (TYLENOL) 500 MG tablet Take 500 mg by mouth every 6 (six) hours as needed  for Pain    albuterol sulfate HFA (PROVENTIL) 108 (90 Base) MCG/ACT inhaler Inhale 2 puffs into the lungs every 6 (six) hours as needed for Wheezing or Shortness of Breath    amLODIPine (NORVASC) 10 MG tablet Take 1 tablet by mouth daily    aspirin 81 MG chewable tablet Chew 81 mg by mouth daily    atorvastatin (LIPITOR) 40 MG tablet Take 1 tablet (40 mg) by mouth daily    carvedilol (COREG) 25 MG tablet Take 1 tablet (25 mg) by mouth every 12 (twelve) hours    dextromethorphan-guaiFENesin (MUCINEX DM) 30-600 MG per 12 hr tablet Take 1 tablet by mouth every 12 (twelve) hours    gabapentin (NEURONTIN) 100 MG capsule Take 1 capsule (100 mg) by mouth every 8 (eight) hours    insulin aspart (NovoLOG) 100 UNIT/ML injection Inject 15 Units into the skin 3 (three) times daily before meals    Insulin Degludec (TRESIBA SC) Inject 35 Unit into the skin    lactobacillus/streptococcus (RISAQUAD) Cap Take 1 capsule by mouth daily    mupirocin (BACTROBAN) 2 % ointment Apply topically daily USe BID for 2 Weeks    oxyCODONE-acetaminophen (PERCOCET) 5-325 MG per tablet Take 1 tablet by mouth every 4 (four) hours as needed for Pain    pantoprazole (PROTONIX) 40 MG tablet Take 40 mg by mouth daily    petrolatum (AQUAPHOR) ointment Apply topically every 12 (twelve) hours    sevelamer (RENVELA) 800 MG tablet Take 800 mg by mouth 2 (two) times daily with meals        Scheduled Meds: PRN Meds:    amLODIPine, 10 mg, Oral, Daily  atorvastatin, 40 mg, Oral, Daily  azithromycin, 500 mg, Intravenous, Q24H  carvedilol, 25 mg, Oral, Q12H SCH  cefTRIAXone, 1 g, Intravenous, Q24H  dextrose, 50 g, Intravenous, Once  doxycycline, 100 mg, Oral, Q12H SCH  gabapentin, 100 mg, Oral, Q8H SCH  heparin (porcine), 5,000 Units, Subcutaneous, Q12H SCH  insulin glargine, 30 Units, Subcutaneous, QHS  insulin lispro, 1-3 Units, Subcutaneous, QHS  insulin lispro, 1-5 Units, Subcutaneous, TID AC  methylPREDNISolone, 40 mg, Intravenous, Q8H  senna-docusate, 2  tablet, Oral, QHS  sevelamer, 800 mg, Oral, BID Meals  vancomycin, 750 mg, Intravenous, Once  [START ON 04/25/2021] vancomycin, , Intravenous, See Admin Instructions          Continuous Infusions:   acetaminophen, 650 mg, Q6H PRN   Or  acetaminophen, 650 mg, Q6H PRN  albuterol-ipratropium, 3 mL, Q6H PRN  dextrose, 15 g of glucose, PRN   Or  dextrose, 12.5 g, PRN   Or  dextrose, 12.5 g, PRN   Or  glucagon (rDNA), 1 mg, PRN  dextrose, 15 g of glucose, PRN   Or  dextrose, 12.5 g, PRN   Or  dextrose, 12.5 g, PRN   Or  glucagon (rDNA), 1 mg, PRN  lactulose, 10 g, Daily PRN  magnesium sulfate, 1 g, PRN  melatonin, 3 mg, QHS PRN  naloxone, 0.2 mg, PRN  ondansetron, 4 mg, Q6H PRN   Or  ondansetron, 4 mg, Q6H PRN  oxyCODONE-acetaminophen, 1 tablet, Q4H PRN  polyethylene glycol, 17 g, Daily PRN  sodium chloride, 100 mL, Q1H PRN  sodium chloride, 250 mL, PRN            Review  of Systems:   A comprehensive review of systems was per the HPI and below:     General ROS: no f/c  HEENT ROS: no blurry vision  Hematological and Lymphatic ROS: no known bleeding  Respiratory ROS: negative for cough, shortness of breath, or wheezing  Cardiovascular ROS: negative for chest pain or dyspnea on exertion  Gastrointestinal ROS: negative for abd/flank pain, change in bowel habits  Genito-Urinary ROS: negative for dysuria  Musculoskeletal ROS: s/p mechanical fall  Neurological ROS: no focal weakness      Physical Exam:     Vitals:    04/24/21 1537 04/24/21 1545 04/24/21 1600 04/24/21 1615   BP: 135/88 186/84 152/60 130/69   Pulse: 68 69 71 69   Resp: (!) 24 16 (!) 35 (!) 60   Temp:    98 F (36.7 C)   TempSrc:       SpO2: 99% 98% 99% 100%   Weight:       Height:           Intake and Output Summary (Last 24 hours) at Date Time  No intake or output data in the 24 hours ending 04/24/21 1625    Recent weights:      03/05/2021     4:00 AM 04/13/2021     1:58 PM 04/13/2021     8:58 PM 04/18/2021     4:44 AM 04/23/2021     1:37 PM 04/24/2021     4:01 AM  04/24/2021     7:10 AM   Weight Monitoring   Height  157.5 cm   157.5 cm  157.5 cm   Height Method  Stated   Stated     Weight 107.4 kg 110.224 kg 110.9 kg 110.605 kg 110 kg 109.77 kg 109.77 kg   Weight Method Bed Scale Actual Bed Scale Bed Scale Stated Estimated    BMI (calculated)  44.5 kg/m2   44.4 kg/m2  44.4 kg/m2       General: awake, alert, oriented x 3, no acute distress.  HEENT: sclera anicteric  Neck: supple  Cardiovascular: regular rate and rhythm\  Lungs: clear to auscultation bilaterally, bilateral air entry, normal work of breathing   Abdomen: soft  Extremities: no edema, AVF  Neuro: A+O x 3      Labs:     Recent Labs   Lab 04/24/21  0053 04/24/21  0002 04/23/21  1352   WBC 6.15 6.44 6.41   Hgb 11.3* 10.9* 11.1*   Hematocrit 37.4 35.6 36.2   Platelets 98* 94* 103*     Recent Labs   Lab 04/24/21  1223 04/24/21  0828 04/24/21  0053 04/23/21  1352   Sodium 137 135 135 136   Potassium 6.3* 6.6* 5.8* 5.8*   Chloride 101 99 100 99   CO2 23 21 23 23    BUN 65.0* 64.0* 60.0* 57.0*   Creatinine 8.5* 8.4* 8.2* 7.6*   Calcium 8.4* 8.3* 8.4* 8.4*   Albumin  --   --  2.6* 2.7*   Glucose 381* 367* 327* 320*   EGFR 5.7 5.8 5.9 6.5         cc: Barbaraann Faster, MD  Horton Marshall, MD

## 2021-04-24 NOTE — Progress Notes (Signed)
Hd tx started via L AVG,+bruit/thrill noted, hematoma around the mouth, pt co of pain around the chest, ribs and mouth due to fell from yesterday, hypertensive as of now, started on 1.0K-2.5Ca, recent K-6.3, Report received from [facility],   04/24/21 1410   Bedside Nurse Communication   Name of bedside RN - pre dialysis Anselm Lis RN   Treatment Initiation- With Dialysis Precautions   Time Out/Safety Check Completed Yes   Consent for HD signed for this hospitalization (Date) 03/04/21   Consent for HD signed for this hospitalization (Time) 1540   Preferred language No   Blood Consent Verified N/A   Dialysis Precautions All Connections Secured   Dialysis Treatment Type Urgent   Special Considerations HIGH K   Is patient diabetic? Yes   RO/Hemodialysis Architectural technologist   Is Total Chlorine less than 0.1 ppm? Yes   Orignial Total Chlorine Testing Time 1400   At 4 Hour Total Chlorine Testing Time 1800   RO/Hemodialysis Facilities manager Number 4    Machine Serial Number [id number]   RO # 21   RO Serial # [id number]   Water Hardness NA   pH 7.2   Pressure Test Verified Yes   Alarms Verified Passed   Machine Temperature 97.7 F (36.5 C)   Alarms Verified Yes   Na+ mEq (Machine) 138 mEq   Bicarb mEq (Machine) 35 mEq   Hemodialysis Conductivity (Machine) 13.6   Hemodialysis Conductivity (Meter) 13.8   Dialyzer Lot Number [id number]   Tubing Lot Number [id number]   RO Machine Log Completed Yes   Hepatitis Status   HBsAg (Antigen) Result Negative   HBsAg Date Drawn 04/13/21   HBsAg Repeat Draw Due Date 05/11/21   Dialysis Weight   Pre-Treatment Weight (Kg) 109.8   Scale Type ICU Bed Scale   Vitals   Heart Rate 72   Resp Rate (!) 33   BP (!) 191/91   SpO2 98 %   Assessment   Mental Status Alert;Oriented;Cooperative   Cardiac (WDL) WDL   Cardiac Regularity Regular   Cardiac Symptoms None   Cardiac Rhythm Normal Sinus Rhythm   Respiratory  WDL   Respiratory Pattern Regular;Labored   Bilateral Breath Sounds Diminished    Cough Non-productive   Edema  WDL   Generalized Edema Weeping   RLE Edema +1   LLE Edema +1   General Skin Color Appropriate for ethnicity   Skin Condition/Temp Warm;Dry   Gastrointestinal (WDL) WDL   Abdomen Inspection Soft   GI Symptoms None   Mobility Bed   Graft/Fistula Left   No placement date or time found.   Access Type: Arteriovenous Fistula  Orientation: Left  Graft/Fistula Location: Upper Arm   Fistula/ Graft Assessment Abnormalities WDL   Needle Size 15 Gauge   Pain Assessment   Charting Type Assessment   Pain Scale Used Numeric Scale (0-10)   Numeric Pain Scale   Pain Score 4   POSS Score 2   Pain Location Mouth;Chest;Rib cage   Pain Orientation Anterior   Pain Descriptors Aching   Pain Frequency Increases with movement;Continuous   Effect of Pain on Daily Activities moderate   Patient's Stated Comfort Functional Goal 1   Pain Intervention(s) Medication   Multiple Pain Sites No   Hemodialysis Comments   Pre-Hemodialysis Comments TIMEOUT & SAFETY CHECKS DONE

## 2021-04-24 NOTE — Respiratory Progress Note (Signed)
Pt states she used to wear a CPAP machine at home, but does not anymore. Refusing to wear hospital machine as well. Discussed with MD. Order d/c'd.

## 2021-04-24 NOTE — Progress Notes (Signed)
Podiatry consulted

## 2021-04-24 NOTE — Progress Notes (Addendum)
FOUR EYES SKIN ASSESSMENT NOTE    1955-03-13  [id number]    Braden Scale Score: 18    POC Initiated for Risk for Altered Skin Yes    Patient Assessed for Correct Mattress Surface Yes  *At risk patients with Braden Score less than 12 must be considered for specialty bed    Mepilex or Adhesive Foam Dressing applied to sacrum/heel if any PI risk factors present: Yes    If Wound/Pressure Injury present:    Wound/PI assessment documented in EHR: Yes    Admitting physician notified: Yes    Wound consult ordered: Yes    Oneita Jolly, RN  April 24, 2021  2:51 AM    Second RN/PCT Name: RIO/ CT    Pt arrived to room 2817 via stretcher from the the emergency room. Upon arrival from the ED pt right toe nail was hanging off the nail bed. Pt is AAOx4, and follow command,  Four eyes skin assessment completed with clin tech. Pt on room 2L NC no acute respiratory distress. C/o of pain 10/10. Vital signs taken and documented. Pt in bed with call bell and belongings in reach. Bed alarm on. continue plan of care.Marland Kitchen

## 2021-04-24 NOTE — Plan of Care (Signed)
Problem: Renal Instability  Goal: Fluid and electrolyte balance are achieved/maintained  Flowsheets (Taken 04/24/2021 1436)  Fluid and electrolyte balance are achieved/maintained:   Monitor/assess lab values and report abnormal values   Provide adequate hydration   Assess for confusion/personality changes   Monitor daily weight   Assess and reassess fluid and electrolyte status  Goal: Free from infection  Flowsheets (Taken 04/24/2021 1436)  Free from infection: Monitor/assess for signs and symptoms of infection     Problem: Patient Receiving Advanced Renal Therapies  Goal: Therapy access site remains intact  Flowsheets (Taken 04/24/2021 1436)  Therapy access site remains intact:   Assess therapy access site   Change therapy access site dressing as needed

## 2021-04-25 ENCOUNTER — Observation Stay: Payer: 59

## 2021-04-25 DIAGNOSIS — R778 Other specified abnormalities of plasma proteins: Secondary | ICD-10-CM

## 2021-04-25 DIAGNOSIS — N186 End stage renal disease: Secondary | ICD-10-CM

## 2021-04-25 DIAGNOSIS — R079 Chest pain, unspecified: Secondary | ICD-10-CM

## 2021-04-25 LAB — VANCOMYCIN, RANDOM: Vancomycin Random: 26.4 ug/mL

## 2021-04-25 LAB — HIGH SENSITIVITY TROPONIN-I
hs Troponin-I: 49.8 ng/L — AB
hs Troponin-I: 59.2 ng/L — AB
hs Troponin-I: 72.2 ng/L — CR

## 2021-04-25 LAB — GLUCOSE WHOLE BLOOD - POCT
Whole Blood Glucose POCT: 446 mg/dL — ABNORMAL HIGH (ref 70–100)
Whole Blood Glucose POCT: 340 mg/dL — ABNORMAL HIGH (ref 70–100)
Whole Blood Glucose POCT: 362 mg/dL — ABNORMAL HIGH (ref 70–100)

## 2021-04-25 LAB — WHOLE BLOOD GLUCOSE POCT
Whole Blood Glucose POCT: 357 mg/dL — ABNORMAL HIGH (ref 70–100)
Whole Blood Glucose POCT: 525 mg/dL (ref 70–100)

## 2021-04-25 LAB — GLUCOSE, RANDOM: Glucose: 381 mg/dL — ABNORMAL HIGH (ref 70–100)

## 2021-04-25 MED ORDER — DEXTROSE 50 % IV SOLN
12.5000 g | INTRAVENOUS | Status: DC | PRN
Start: 2021-04-25 — End: 2021-05-06

## 2021-04-25 MED ORDER — SODIUM CHLORIDE 0.9 % IV BOLUS
100.0000 mL | INTRAVENOUS | Status: AC | PRN
Start: 2021-04-25 — End: 2021-04-25

## 2021-04-25 MED ORDER — INSULIN LISPRO 100 UNIT/ML SOLN (WRAP)
5.0000 [IU] | Freq: Once | Status: AC
Start: 2021-04-26 — End: 2021-04-25
  Administered 2021-04-25: 5 [IU] via SUBCUTANEOUS
  Filled 2021-04-25: qty 15

## 2021-04-25 MED ORDER — INSULIN LISPRO 100 UNIT/ML SOLN (WRAP)
1.0000 [IU] | Freq: Three times a day (TID) | Status: DC
Start: 2021-04-26 — End: 2021-05-06
  Administered 2021-04-26: 8 [IU] via SUBCUTANEOUS
  Administered 2021-04-26: 5 [IU] via SUBCUTANEOUS
  Administered 2021-04-26 – 2021-04-27 (×3): 3 [IU] via SUBCUTANEOUS
  Administered 2021-04-28: 5 [IU] via SUBCUTANEOUS
  Administered 2021-04-28: 1 [IU] via SUBCUTANEOUS
  Administered 2021-04-28 – 2021-04-29 (×3): 3 [IU] via SUBCUTANEOUS
  Administered 2021-04-30: 1 [IU] via SUBCUTANEOUS
  Administered 2021-05-02 – 2021-05-03 (×2): 5 [IU] via SUBCUTANEOUS
  Administered 2021-05-04: 1 [IU] via SUBCUTANEOUS
  Administered 2021-05-05: 7 [IU] via SUBCUTANEOUS
  Administered 2021-05-06: 5 [IU] via SUBCUTANEOUS
  Filled 2021-04-25: qty 9
  Filled 2021-04-25: qty 15
  Filled 2021-04-25: qty 27
  Filled 2021-04-25 (×2): qty 9
  Filled 2021-04-25 (×2): qty 15
  Filled 2021-04-25: qty 9
  Filled 2021-04-25 (×2): qty 3
  Filled 2021-04-25: qty 24
  Filled 2021-04-25: qty 15

## 2021-04-25 MED ORDER — INSULIN GLARGINE 100 UNIT/ML SC SOLN
35.0000 [IU] | Freq: Every evening | SUBCUTANEOUS | Status: DC
Start: 2021-04-25 — End: 2021-04-26
  Administered 2021-04-25: 35 [IU] via SUBCUTANEOUS
  Filled 2021-04-25: qty 35

## 2021-04-25 MED ORDER — SODIUM CHLORIDE 0.9 % IV BOLUS
250.0000 mL | INTRAVENOUS | Status: AC | PRN
Start: 2021-04-25 — End: 2021-04-25

## 2021-04-25 MED ORDER — DEXTROSE 10 % IV BOLUS
12.5000 g | INTRAVENOUS | Status: DC | PRN
Start: 2021-04-25 — End: 2021-05-06

## 2021-04-25 MED ORDER — GLUCAGON 1 MG IJ SOLR (WRAP)
1.0000 mg | INTRAMUSCULAR | Status: DC | PRN
Start: 2021-04-25 — End: 2021-05-06

## 2021-04-25 MED ORDER — INSULIN LISPRO 100 UNIT/ML SOLN (WRAP)
1.0000 [IU] | Freq: Every evening | Status: DC
Start: 2021-04-26 — End: 2021-05-06
  Administered 2021-04-26: 2 [IU] via SUBCUTANEOUS
  Administered 2021-04-27: 1 [IU] via SUBCUTANEOUS
  Administered 2021-04-28: 3 [IU] via SUBCUTANEOUS
  Administered 2021-04-29: 4 [IU] via SUBCUTANEOUS
  Administered 2021-05-01: 2 [IU] via SUBCUTANEOUS
  Administered 2021-05-02: 3 [IU] via SUBCUTANEOUS
  Administered 2021-05-04: 2 [IU] via SUBCUTANEOUS
  Filled 2021-04-25: qty 3
  Filled 2021-04-25: qty 12
  Filled 2021-04-25: qty 6
  Filled 2021-04-25: qty 9
  Filled 2021-04-25: qty 6
  Filled 2021-04-25: qty 9
  Filled 2021-04-25: qty 6

## 2021-04-25 MED ORDER — AQUAPHOR EX OINT
TOPICAL_OINTMENT | Freq: Two times a day (BID) | CUTANEOUS | Status: DC
Start: 2021-04-25 — End: 2021-05-06
  Filled 2021-04-25: qty 50

## 2021-04-25 MED ORDER — INSULIN LISPRO 100 UNIT/ML SOLN (WRAP)
5.0000 [IU] | Freq: Three times a day (TID) | Status: DC
Start: 2021-04-25 — End: 2021-04-25
  Administered 2021-04-25 (×2): 5 [IU] via SUBCUTANEOUS

## 2021-04-25 MED ORDER — INSULIN LISPRO 100 UNIT/ML SOLN (WRAP)
8.0000 [IU] | Freq: Three times a day (TID) | Status: DC
Start: 2021-04-26 — End: 2021-04-25

## 2021-04-25 MED ORDER — INSULIN LISPRO 100 UNIT/ML SOLN (WRAP)
10.0000 [IU] | Freq: Once | Status: AC
Start: 2021-04-25 — End: 2021-04-25
  Administered 2021-04-25: 10 [IU] via SUBCUTANEOUS
  Filled 2021-04-25: qty 30

## 2021-04-25 MED ORDER — GLUCOSE 40 % PO GEL (WRAP)
15.0000 g | ORAL | Status: DC | PRN
Start: 2021-04-25 — End: 2021-05-06

## 2021-04-25 NOTE — Nursing Progress Note (Signed)
---  Day Shift-Unit 28  Neuro: A/Ox 4, Calm/Follows commands. Neuro checks Q4H done per orders    Cardio: NSR on the monitor, HR between 70-80 throughout the day     Pulm: On 4L NC, SpO2 at 98%    GI: Consistent Carb Heart Healthy Diet,   Soft Abd, No nausea/vomiting reported.      GU: No BM, External Foley Cath present, Peri-care done    Skin: Dry/Warm, Wound care and consult done by [facility]. See separate note     Musk: PMP Activity: Bed mobility  BUE: Full mobility   BLE: Limited mobility    ---Events:    -Pt received dialysis this morning: 3.5 hrs, 3 L removed.    -Cardiology: Venous doppler for bilateral legs done to rule out DVT.   -Podiatrist: Arterial doppler for right leg and toe      Contact isolation / Fall risk precautions in place, bedside table and call light within reach.   Pt resting comfortably in bed at end of shift.

## 2021-04-25 NOTE — Progress Notes (Signed)
Progress Note- Pharmacy Vancomycin Dosing Consult:  Day 2  Vancomycin Indication: MRSA abd infection  Vancomycin Monitoring Goal: Pre-dialysis 15 - 25 mg/L for hemodialysis patients    Imaging:  04/01 CT abd: Scarring in the left paramedian abdominal wall with a 1 cm area of low attenuation in the subcutaneous tissues which may represent residual fluid.     Pertinent Cultures:     Date Source  Organism & Pertinent Susceptibilities     03/22  Bcx (2/2)  NGTD   03/23 Wound MRSA    03/23 Wound 2nd sample  MRSA              Serum creatinine: 6.5 mg/dL (H) 04/24/21 2119  Estimated creatinine clearance: 9.9 mL/min (A)    Baseline SCr: ESRD on HD MWF   Nephrotoxic drugs administered in the past 48 hours: Advanced Age (>65Y), IV Contrast Dye    Assessment   Measured Vancomycin Level: level today supra-therapeutic will hold vancomycin today and f/u with level prior to next dialysis day on 04/05.   Recent Labs   Lab 04/25/21  0356 04/24/21  1401 04/19/21  0338   Vancomycin Random 26.4 20.4 28.1         Recommendations:  Hold Vancomycin dose today due to supra-therapeutic level.   New Vancomycin level: f/u with Random level with am labs prior to next dialysis day on 04/05.      Thank you,  Lucia Bitter, [place]

## 2021-04-25 NOTE — Progress Notes (Signed)
Established HD patient at [facility] on MWF schedule.  Writer to coordinate ROC when pt is ready for d/c.     Freida Busman, HD Care Coordinator  [phone]

## 2021-04-25 NOTE — Progress Notes (Signed)
[facility] PROGRESS NOTE  [id number]      Date Time: 04/25/21 11:34 AM  Patient Name: [name]  Attending Physician: Barbaraann Faster, MD    CC: follow-up ESRD     Assessment:     ESRD on HD MWF at [facility] - followed by Dr. Randel Pigg  Hyperkalemia due to missed HD & hyperglycemia- last HD on 3/29  Mild hypocalcemia due to CKD  Anemia in CKD, hgb at goal  HTN - controlled  DM type 2 with hyperglycemia  Hyperphosphatemia: PTH 1579 on 3/23  Secondary hyperparathyroidism  Fall   Abdominal wall abscess MRSA on vancomycin     Recommendations:     HD 4/2 for hyperkalemia  HD routine today per MWF schedule   UF tomorrow , remains on supp oxygen   Assess daily for extra UF needs  Vanc per level       Case discussed with: RN, pt      Gaylyn Lambert, MD  [facility]  [id number] (office)      Subjective:     Seen on HD, somnolent after getting pain medication  Tolerated HD well    Dialysis Treatment Data:  Blood flow rate: 200 mL/min  Dialysate flow rate: 700 mL/min  Ultrafiltration rate: 1120 mL/hr    Dialysate potassium: 2 mEq/L  Dialysate calcium: 2.5 mEq/L    Arterial pressure: -115 mmHg  Venous pressure: 215mmHg     Review of Systems:     No cp  No sob  R Toe pain      Physical Exam:     Vitals:    04/25/21 1030 04/25/21 1045 04/25/21 1100 04/25/21 1115   BP: 112/61 112/57 108/55 123/64   Pulse: 61 63 63 63   Resp: 12 17 12 17    Temp:    97.8 F (36.6 C)   TempSrc:       SpO2: 100% 100% 100% 100%   Weight:       Height:           Intake and Output Summary (Last 24 hours) at Date Time    Intake/Output Summary (Last 24 hours) at 04/25/2021 1134  Last data filed at 04/25/2021 1115  Gross per 24 hour   Intake 118 ml   Output 3000 ml   Net -2882 ml       General: awake, alert, oriented x 3, no acute distress  Cardiovascular: regular rate and rhythm, no murmurs, rubs or gallops  Lungs: clear to auscultation bilaterally, without wheezing, rhonchi, or rales  Abdomen: soft, non-tender,  non-distended, normoactive bowel sounds  Extremities: no clubbing, cyanosis, or edema      Meds:      Scheduled Meds: PRN Meds:    amLODIPine, 10 mg, Oral, Daily  atorvastatin, 40 mg, Oral, Daily  azithromycin, 500 mg, Intravenous, Q24H  carvedilol, 25 mg, Oral, Q12H SCH  cefTRIAXone, 1 g, Intravenous, Q24H  dextrose, 50 g, Intravenous, Once  doxycycline, 100 mg, Oral, Q12H SCH  gabapentin, 100 mg, Oral, Q8H SCH  heparin (porcine), 5,000 Units, Subcutaneous, Q12H SCH  insulin glargine, 30 Units, Subcutaneous, QHS  insulin lispro, 1-3 Units, Subcutaneous, QHS  insulin lispro, 1-5 Units, Subcutaneous, TID AC  insulin lispro, 5 Units, Subcutaneous, TID AC  methylPREDNISolone, 40 mg, Intravenous, Q8H  senna-docusate, 2 tablet, Oral, QHS  sevelamer, 800 mg, Oral, BID Meals  vancomycin, , Intravenous, See Admin Instructions          Continuous Infusions:   acetaminophen,  650 mg, Q6H PRN   Or  acetaminophen, 650 mg, Q6H PRN  albuterol-ipratropium, 3 mL, Q6H PRN  dextrose, 15 g of glucose, PRN   Or  dextrose, 12.5 g, PRN   Or  dextrose, 12.5 g, PRN   Or  glucagon (rDNA), 1 mg, PRN  dextrose, 15 g of glucose, PRN   Or  dextrose, 12.5 g, PRN   Or  dextrose, 12.5 g, PRN   Or  glucagon (rDNA), 1 mg, PRN  lactulose, 10 g, Daily PRN  magnesium sulfate, 1 g, PRN  melatonin, 3 mg, QHS PRN  naloxone, 0.2 mg, PRN  ondansetron, 4 mg, Q6H PRN   Or  ondansetron, 4 mg, Q6H PRN  oxyCODONE-acetaminophen, 1 tablet, Q4H PRN  polyethylene glycol, 17 g, Daily PRN  sodium chloride, 100 mL, Q1H PRN  sodium chloride, 250 mL, PRN              Labs:     Recent Labs   Lab 04/24/21  0053 04/24/21  0002 04/23/21  1352   WBC 6.15 6.44 6.41   Hgb 11.3* 10.9* 11.1*   Hematocrit 37.4 35.6 36.2   Platelets 98* 94* 103*     Recent Labs   Lab 04/24/21  2119 04/24/21  1223 04/24/21  0828 04/24/21  0053 04/23/21  1352   Sodium 136 137 135 135 136   Potassium 5.4* 6.3* 6.6* 5.8* 5.8*   Chloride 98* 101 99 100 99   CO2 22 23 21 23 23    BUN 46.0* 65.0* 64.0*  60.0* 57.0*   Creatinine 6.5* 8.5* 8.4* 8.2* 7.6*   Calcium 8.6 8.4* 8.3* 8.4* 8.4*   Albumin  --   --   --  2.6* 2.7*   Glucose 468* 381* 367* 327* 320*   EGFR 7.7 5.7 5.8 5.9 6.5             Invalid input(s): LEUKOCYTESUR        Imaging personally reviewed, including: CT Angiogram Chest    Result Date: 04/24/2021  1.Negative for acute pulmonary embolism. 2.Worsening aeration of both lungs with increase in bilateral dependent atelectasis. Ongoing septal wall thickening suggestive of volume overload/pulmonary edema. Likely new trace right pleural effusion. 3.Subtle improvement but significant residual soft tissue stranding and skin thickening in the left chest wall/breast. 4.Unchanged mediastinal, hilar, and left axillary lymphadenopathy. Nelya Ebadirad 04/24/2021 8:31 PM    CT Head WO Contrast    Result Date: 04/24/2021   No acute intracranial abnormality. Nelya Ebadirad 04/24/2021 6:16 PM         Signed by: Gaylyn Lambert, MD

## 2021-04-25 NOTE — Progress Notes (Signed)
Received eport from floor  nurse at bedside. Patient is awake and alert complaining of pain from her rib cage worsens with movement  s/p fall at home. Pain medicine administered by floor nurse prior to starting procedure. VSS, on continuous cardiac and oxygen saturation monitoring. On 2 liters of oxygen  via  nasal cannula. Bilateral upper and lower ext edema were noted, no SOM observed nor reported. Left upper arm AV graft old cannulation site slightly bleeding, gauze applied to site. Treatment in progress and will continue to monitor.    04/25/21 0745   Graft/Fistula Left   No placement date or time found.   Access Type: Arteriovenous Fistula  Orientation: Left  Graft/Fistula Location: Upper Arm   Fistula/ Graft Assessment Abnormalities WDL   Needle Size 15 Gauge   Vitals   Temp 97.8 F (36.6 C)   Heart Rate 67   Resp Rate (!) 29   BP 156/79   SpO2 98 %   O2 Device Nasal cannula   Machine Metrics   $Treatment Started/Capturing Charge Yes   Blood Flow Rate (mL/min) 200 mL/min   Arterial Pressure (mmHg) -120 mmHg   Venous Pressure (mmHg) 100   Dialysate Flow Rate (mL/min) 700 mL/min   Transmembrane Pressure (mmHg) 56 mmHg   Ultrafiltration Rate (mL/Hr) 970 mL/hr   Fluid Removal (ml) 0 ml   Fluid Bolus (ml) 200 ml   Dialysate K (mEq/L) 2 mEq/L   Dialysate CA (mEq/L) 2.5 mEq/L   Hemodialysis Comments   Arteriovenous Lines Secure Yes   Comments Treatment initiated with out any problem cannulating. C/O chest pain, floor RN administered pain medication.

## 2021-04-25 NOTE — Progress Notes (Signed)
PULM. CCM PROGRESS NOTE    Date Time: 04/25/21 2:51 PM  Patient Name: Barren,Liyah L      Assessment:     .  Subcentimeter pulmonary nodules  .  Atelectasis of both lungs  .  End-stage renal disease  .  Hypertension  .  Hyperlipidemia  .  Diabetes mellitus  .  Atypical chest pain  .  Sleep apnea    Plan:     .  Subcentimeter pulmonary nodules, follow-up CT as an outpatient between 6 months to 1 year  .  Atelectasis of both lungs, continue incentive spirometry  .  End-stage renal disease, dialysis dependence  .  Hypertension, on amlodipine, Coreg, continue to monitor blood pressure  .  Hyperlipidemia, on statin  .  Atypical chest pain, status post fall with trauma to the chance, probably musculoskeletal chest pain, monitor  .  Continue nocturnal CPAP  .  Discontinue steroids    Discussed with patient  Discussed with bedside nurse    Subjective:   Patient is a 66 y.o. female who is awake, alert, and comfortable.     Medications:     Current Facility-Administered Medications   Medication Dose Route Frequency    amLODIPine  10 mg Oral Daily    atorvastatin  40 mg Oral Daily    azithromycin  500 mg Intravenous Q24H    carvedilol  25 mg Oral Q12H SCH    cefTRIAXone  1 g Intravenous Q24H    dextrose  50 g Intravenous Once    gabapentin  100 mg Oral Q8H [place]    heparin (porcine)  5,000 Units Subcutaneous Q12H [place]    insulin glargine  30 Units Subcutaneous QHS    insulin lispro  1-3 Units Subcutaneous QHS    insulin lispro  1-5 Units Subcutaneous TID AC    insulin lispro  5 Units Subcutaneous TID AC    methylPREDNISolone  40 mg Intravenous Q8H    petrolatum   Topical Q12H    senna-docusate  2 tablet Oral QHS    sevelamer  800 mg Oral BID Meals    vancomycin   Intravenous See Admin Instructions       Review of Systems:     General ROS: negative for - chills, fever or night sweats  Ophthalmic ROS: negative for - double vision, excessive tearing, itchy eyes or photophobia  ENT ROS: negative for - headaches, nasal congestion,  sore throat or vertigo  Respiratory ROS: negative for - cough, hemoptysis, orthopnea, pleuritic pain, shortness of breath, tachypnea or wheezing  Cardiovascular ROS: Positive for atypical chest pain, negative for - edema, orthopnea, rapid heart rate  Gastrointestinal ROS: negative for - abdominal pain, blood in stools, constipation, diarrhea, heartburn or nausea/vomiting  Musculoskeletal ROS: negative for - muscle pain  Neurological ROS: negative for - confusion, dizziness, headaches, speech problems, visual changes or weakness  Dermatological ROS: negative for dry skin, pruritus and rash    Physical Exam:   BP 123/64   Pulse 63   Temp 97.8 F (36.6 C)   Resp 17   Ht 1.575 m (5\' 2" )   Wt 109.8 kg (242 lb 1 oz)   LMP  (LMP Unknown) Comment: post menopausal  SpO2 100%   BMI 44.27 kg/m     Intake and Output Summary (Last 24 hours) at Date Time    Intake/Output Summary (Last 24 hours) at 04/25/2021 1451  Last data filed at 04/25/2021 1115  Gross per 24 hour   Intake  118 ml   Output 3000 ml   Net -2882 ml       General appearance - alert,  and in no distress  Mental status - alert, oriented to person, place, and time  Eyes - pupils equal and reactive, extraocular eye movements intact  Ears - no external ear lesions seen  Nose - no nasal discharge   Mouth - clear oral mucosa, moist   Neck - supple, no significant adenopathy  Chest - clear to auscultation, no wheezes, rales or rhonchi, symmetric air entry  Heart - normal rate, regular rhythm, normal S1, S2, no rubs, clicks or gallops  Abdomen - soft, nontender, nondistended, no masses or organomegaly  Neurological - alert, oriented, normal speech, no focal findings or movement disorder noted  Musculoskeletal - no joint swelling or tenderness  Extremities -plus pedal edema, no clubbing or cyanosis  Skin - normal coloration, no rashes    Labs:     Recent Labs   Lab 04/24/21  0053 04/24/21  0002 04/23/21  1352   WBC 6.15 6.44 6.41   Hgb 11.3* 10.9* 11.1*   Hematocrit  37.4 35.6 36.2   Platelets 98* 94* 103*   MCV 94.7 94.2 94.0   Neutrophils 79.6 75.3 70.5     Recent Labs   Lab 04/24/21  2119 04/24/21  1223 04/24/21  0828 04/24/21  0053 04/23/21  1352   Sodium 136 137 135 135 136   Potassium 5.4* 6.3* 6.6* 5.8* 5.8*   Chloride 98* 101 99 100 99   CO2 22 23 21 23 23    BUN 46.0* 65.0* 64.0* 60.0* 57.0*   Creatinine 6.5* 8.5* 8.4* 8.2* 7.6*   Glucose 468* 381* 367* 327* 320*   Calcium 8.6 8.4* 8.3* 8.4* 8.4*   Protein, Total  --   --   --  6.4 6.5   Albumin  --   --   --  2.6* 2.7*   AST (SGOT)  --   --   --  24 20   ALT  --   --   --  32 31   Alkaline Phosphatase  --   --   --  297* 279*   Bilirubin, Total  --   --   --  0.3 0.6     Glucose:    Recent Labs   Lab 04/24/21  2119 04/24/21  1223 04/24/21  0828 04/24/21  0053 04/23/21  1352   Glucose 468* 381* 367* 327* 320*           Rads:   Radiological Procedure reviewed.  Radiology Results (24 Hour)       Procedure Component Value Units Date/Time    US Venous Low Extrem Duplex [place] [[id number]] Collected: 04/25/21 1324    Order Status: Completed Updated: 04/25/21 1326    Narrative:      CLINICAL INDICATIONS: 66 year old female with bilateral lower extremity  pain and swelling    TECHNIQUE: Duplex evaluation of the veins of the lower extremities is  performed from the lower pelvis to the upper calves bilaterally with  gray  scale imaging, transverse compression and gated and color Doppler  techniques. Calf vein evaluation is performed as well with gray scale  and augmentation techniques.     INTERPRETATION: Examination of the venous system of the right and left  lower extremities demonstrates no evidence of intraluminal thrombus or  obstruction to venous flow. Normal phasicity is present at the  iliofemoral junctions indicating no central  obstruction. There is normal  coaptation of the femoropopliteal veins throughout their course with  transverse compression. The saphenofemoral junction is also noted to be  widely patent  bilaterally. Augmented flow is present in the calf veins  bilaterally.      Impression:       NORMAL VENOUS DUPLEX OF THE LOWER EXTREMITIES. NO EVIDENCE  OF INTRALUMINAL THROMBUS OR OBSTRUCTION TO VENOUS FLOW IN RIGHT OR LEFT  LOWER EXTREMITY.    Nelma Rothman, MD  04/25/2021 1:24 PM    CT Angiogram Chest [[id number]] Collected: 04/24/21 2013    Order Status: Completed Updated: 04/24/21 2033    Narrative:      CLINICAL INFORMATION: PE    TECHNIQUE: CT angiogram of the chest. Images were obtained after bolus  administration of intravenous contrast. In addition to routine coronal  and sagittal reformations, post processed MIP angiographic  reconstructions appropriate for analysis of the pulmonary arteries were  generated.    CONTRAST DOSE: 100 mL Omnipaque 350    COMPARISON: Chest CT on 04/23/2021      CTA FINDINGS:  Pulmonary Arteries  Image quality: Satisfactory assessment of main and lobar pulmonary  arteries. Limited assessment of segmental and  subsegmental pulmonary  arteries.     Negative for acute pulmonary embolism.    RV/LV ratio in the setting of PE (axial): NA      CT FINDINGS:  THYROID GLAND: Normal.    MEDIASTINUM, PULMONARY HILA AND AXILLAE: Limited due to phase of IV  contrast. Redemonstration of mildly enlarged mediastinal and hilar lymph  nodes including Station 5, Station 4R, Station 10 R and 10 mL. Unchanged  enlarged left axillary and subpectoral nodes.    CARDIOVASCULAR: Unchanged mild cardiomegaly and calcifications of  coronary arteries.. No pericardial effusion. The ascending aorta and  main pulmonary artery are normal in caliber.    TRACHEA AND AIRWAYS: Central airways are patent and clear.    LUNGS AND PLEURA: Worsening in aeration of both lungs with increase in  bilateral dependent atelectasis. Ongoing septal wall thickening  suggestive of volume overload/pulmonary edema. Similar pattern of mosaic  attenuation essentially involving all lobes that can be seen with small  or reactive airways  disease. Likely new trace right pleural effusion.  Suboptimal for pulmonary nodule evaluation.    UPPER ABDOMEN: No acute findings within visualized upper abdomen.    SOFT TISSUES: Significant and soft tissue stranding with overlying skin  thickening of the left chest wall/breast have somewhat improved.    BONES: No aggressive osseous lesion. Left shoulder arthroplasty.        Impression:        1.Negative for acute pulmonary embolism.  2.Worsening aeration of both lungs with increase in bilateral dependent  atelectasis. Ongoing septal wall thickening suggestive of volume  overload/pulmonary edema. Likely new trace right pleural effusion.  3.Subtle improvement but significant residual soft tissue stranding and  skin thickening in the left chest wall/breast.  4.Unchanged mediastinal, hilar, and left axillary lymphadenopathy.          Nelya Ebadirad  04/24/2021 8:31 PM    CT Head WO Contrast [[id number]] Collected: 04/24/21 1812    Order Status: Completed Updated: 04/24/21 1818    Narrative:      CLINICAL INFORMATION: syncope    TECHNIQUE: CT of the head without contrast.    All CT scans are performed using one of these three dose reduction  techniques: automated exposure control, adjustment of the mA and/or kV  according to patient size,  or use of iterative reconstruction  techniques.    COMPARISON: Head CT 04/23/2021    FINDINGS:     INTRACRANIAL:  HEMORRHAGE: No acute hemorrhage.  VENTRICLES: The ventricles and subarachnoid spaces are proportionately  enlarged reflecting volume loss.         INFARCT: No acute territorial infarct.  BRAIN: Mild periventricular hypodensity is expected for age. No mass or  mass effect.  BASAL CISTERNS: Normal.  OTHER: Intracranial vascular calcifications.    EXTRACRANIAL:  SKULL: No suspicious osseous lesions.  SINUSES: The imaged paranasal sinuses and mastoids are clear.  OTHER: Left lens replacement.      Impression:         No acute intracranial abnormality.     Nelya  Ebadirad  04/24/2021 6:16 PM              Inda Castle, MD  Pulmonary and critical care  04/25/21

## 2021-04-25 NOTE — OT Eval Note (Signed)
[facility]   Occupational Therapy Attempt Note    Patient:  [name] MRN#:  [id number]  Unit:  [place] [facility] INTERMEDIATE CARE Room/Bed:  H3125/G8719-B      Occupational Therapy evaluation attempted on 04/25/2021 at 2:35 PM unable to complete secondary to patient eating.   Occupational therapy will follow up later today as able.    RN aware.     Rolin Barry [place], [place]  [phone]  Physical Medicine and [facility]    04/25/2021  2:35 PM

## 2021-04-25 NOTE — Progress Notes (Signed)
Pt wear the CPAP few hours only and removed the mask     04/24/21 2353   BiPAP/CPAP Activities   $ Pt on CPAP? Yes   Home CPAP - BiPAP? No   Status: CPAP used at night only   Skin integrity No redness or breakdown noted   BiPAP/CPAP interface fit check Yes   Adverse Reactions None   Safety Check Done Yes   BiPAP/CPAP Settings   Vent ID Resmed   Vent Mode CPAP   PEEP/EPAP 7 cm H20   FiO2   (bleed 3L 02)   BiPAP/CPAP Measurements   SpO2 99 %   Performing Departments   CPAP performing department formula [id number]

## 2021-04-25 NOTE — OT Eval Note (Signed)
[facility]   Occupational Therapy Attempt Note    Patient:  [name] MRN#:  [id number]  Unit:  [place] 28 INTERMEDIATE CARE Room/Bed:  J6283/T5176-H      Occupational Therapy evaluation attempted on 04/25/2021 at 3:48 PM unable to complete secondary to  Pt sleepy and lethargic. Unable to maintain alertness enough to participate in therapy evaluation.   Occupational therapy will follow up at a later time/date as able.    Dannial Monarch, OTR/L    Physical Medicine and [facility]  [phone]    04/25/2021  3:49 PM

## 2021-04-25 NOTE — Progress Notes (Signed)
Progress Note    Date Time: 04/25/21 10:14 AM  Patient Name: [name]  Attending Physician: Barbaraann Faster, MD      Assessment & Plan:   Syncope - likely in some way related to marked hypoxia which was occurring at the same time.  Head CT x 2 negative.    Pt c/o chest pain - nurse reports this was since a fall - not a recent complaint.    - No further work up at this time.    Subjective:   Pt c/o chest pain     Review of Systems:   Pt too cognitively or communication impaired to participate in ROS.    Physical Exam:   Blood pressure 152/50, pulse 65, temperature 97.8 F (36.6 C), resp. rate 22, height 1.575 m (5\' 2" ), weight 109.8 kg (242 lb 1 oz), SpO2 100 %.    Mildly sob during conversation  Mild confusion about events  Follows simple commands  Undergoing dialysis at this time      Meds:      Scheduled Meds: PRN Meds:    amLODIPine, 10 mg, Oral, Daily  atorvastatin, 40 mg, Oral, Daily  azithromycin, 500 mg, Intravenous, Q24H  carvedilol, 25 mg, Oral, Q12H SCH  cefTRIAXone, 1 g, Intravenous, Q24H  dextrose, 50 g, Intravenous, Once  doxycycline, 100 mg, Oral, Q12H SCH  gabapentin, 100 mg, Oral, Q8H SCH  heparin (porcine), 5,000 Units, Subcutaneous, Q12H SCH  insulin glargine, 30 Units, Subcutaneous, QHS  insulin lispro, 1-3 Units, Subcutaneous, QHS  insulin lispro, 1-5 Units, Subcutaneous, TID AC  insulin lispro, 5 Units, Subcutaneous, TID AC  methylPREDNISolone, 40 mg, Intravenous, Q8H  senna-docusate, 2 tablet, Oral, QHS  sevelamer, 800 mg, Oral, BID Meals  vancomycin, , Intravenous, See Admin Instructions        Continuous Infusions:   acetaminophen, 650 mg, Q6H PRN   Or  acetaminophen, 650 mg, Q6H PRN  albuterol-ipratropium, 3 mL, Q6H PRN  dextrose, 15 g of glucose, PRN   Or  dextrose, 12.5 g, PRN   Or  dextrose, 12.5 g, PRN   Or  glucagon (rDNA), 1 mg, PRN  dextrose, 15 g of glucose, PRN   Or  dextrose, 12.5 g, PRN   Or  dextrose, 12.5 g, PRN   Or  glucagon (rDNA), 1 mg, PRN  lactulose, 10 g,  Daily PRN  magnesium sulfate, 1 g, PRN  melatonin, 3 mg, QHS PRN  naloxone, 0.2 mg, PRN  ondansetron, 4 mg, Q6H PRN   Or  ondansetron, 4 mg, Q6H PRN  oxyCODONE-acetaminophen, 1 tablet, Q4H PRN  polyethylene glycol, 17 g, Daily PRN  sodium chloride, 100 mL, Q1H PRN  sodium chloride, 250 mL, PRN            I personally reviewed all of the medications    Labs:     Recent Labs   Lab 04/24/21  2119 04/24/21  1223 04/24/21  0828 04/24/21  0053 04/23/21  1352   Glucose 468* 381* 367* 327* 320*   BUN 46.0* 65.0* 64.0* 60.0* 57.0*   Creatinine 6.5* 8.5* 8.4* 8.2* 7.6*   Calcium 8.6 8.4* 8.3* 8.4* 8.4*   Sodium 136 137 135 135 136   Potassium 5.4* 6.3* 6.6* 5.8* 5.8*   Chloride 98* 101 99 100 99   CO2 22 23 21 23 23    Albumin  --   --   --  2.6* 2.7*   AST (SGOT)  --   --   --  24 20   ALT  --   --   --  32 31   Bilirubin, Total  --   --   --  0.3 0.6   Alkaline Phosphatase  --   --   --  297* 279*     Recent Labs   Lab 04/24/21  0053 04/24/21  0002 04/23/21  1352   WBC 6.15 6.44 6.41   Hgb 11.3* 10.9* 11.1*   Hematocrit 37.4 35.6 36.2   MCV 94.7 94.2 94.0   MCH 28.6 28.8 28.8   MCHC 30.2* 30.6* 30.7*   Platelets 98* 94* 103*         No results for input(s): PTT, PT, INR in the last 72 hours.       Radiology Results (24 Hour)       Procedure Component Value Units Date/Time    CT Angiogram Chest [[id number]] Collected: 04/24/21 2013    Order Status: Completed Updated: 04/24/21 2033    Narrative:      CLINICAL INFORMATION: PE    TECHNIQUE: CT angiogram of the chest. Images were obtained after bolus  administration of intravenous contrast. In addition to routine coronal  and sagittal reformations, post processed MIP angiographic  reconstructions appropriate for analysis of the pulmonary arteries were  generated.    CONTRAST DOSE: 100 mL Omnipaque 350    COMPARISON: Chest CT on 04/23/2021      CTA FINDINGS:  Pulmonary Arteries  Image quality: Satisfactory assessment of main and lobar pulmonary  arteries. Limited assessment of  segmental and  subsegmental pulmonary  arteries.     Negative for acute pulmonary embolism.    RV/LV ratio in the setting of PE (axial): NA      CT FINDINGS:  THYROID GLAND: Normal.    MEDIASTINUM, PULMONARY HILA AND AXILLAE: Limited due to phase of IV  contrast. Redemonstration of mildly enlarged mediastinal and hilar lymph  nodes including Station 5, Station 4R, Station 10 R and 10 mL. Unchanged  enlarged left axillary and subpectoral nodes.    CARDIOVASCULAR: Unchanged mild cardiomegaly and calcifications of  coronary arteries.. No pericardial effusion. The ascending aorta and  main pulmonary artery are normal in caliber.    TRACHEA AND AIRWAYS: Central airways are patent and clear.    LUNGS AND PLEURA: Worsening in aeration of both lungs with increase in  bilateral dependent atelectasis. Ongoing septal wall thickening  suggestive of volume overload/pulmonary edema. Similar pattern of mosaic  attenuation essentially involving all lobes that can be seen with small  or reactive airways disease. Likely new trace right pleural effusion.  Suboptimal for pulmonary nodule evaluation.    UPPER ABDOMEN: No acute findings within visualized upper abdomen.    SOFT TISSUES: Significant and soft tissue stranding with overlying skin  thickening of the left chest wall/breast have somewhat improved.    BONES: No aggressive osseous lesion. Left shoulder arthroplasty.        Impression:        1.Negative for acute pulmonary embolism.  2.Worsening aeration of both lungs with increase in bilateral dependent  atelectasis. Ongoing septal wall thickening suggestive of volume  overload/pulmonary edema. Likely new trace right pleural effusion.  3.Subtle improvement but significant residual soft tissue stranding and  skin thickening in the left chest wall/breast.  4.Unchanged mediastinal, hilar, and left axillary lymphadenopathy.          Nelya Ebadirad  04/24/2021 8:31 PM    CT Head WO Contrast [[id number]] Collected: 04/24/21 1812  Order  Status: Completed Updated: 04/24/21 1818    Narrative:      CLINICAL INFORMATION: syncope    TECHNIQUE: CT of the head without contrast.    All CT scans are performed using one of these three dose reduction  techniques: automated exposure control, adjustment of the mA and/or kV  according to patient size, or use of iterative reconstruction  techniques.    COMPARISON: Head CT 04/23/2021    FINDINGS:     INTRACRANIAL:  HEMORRHAGE: No acute hemorrhage.  VENTRICLES: The ventricles and subarachnoid spaces are proportionately  enlarged reflecting volume loss.         INFARCT: No acute territorial infarct.  BRAIN: Mild periventricular hypodensity is expected for age. No mass or  mass effect.  BASAL CISTERNS: Normal.  OTHER: Intracranial vascular calcifications.    EXTRACRANIAL:  SKULL: No suspicious osseous lesions.  SINUSES: The imaged paranasal sinuses and mastoids are clear.  OTHER: Left lens replacement.      Impression:         No acute intracranial abnormality.     Nelya Ebadirad  04/24/2021 6:16 PM             All recent brain and spine imaging (MRI, CT) results reviewed.    Code status listed in chart confirmed    The notes from the last 24 hours were reviewed.     Case discussed with: Dr.Khosla and pt and nurse    35 minutes;  involving time spent examining patient, in counseling or coordination of care, reviewing test results, and in documentation.    Signed by: Merceda Elks, MD  [place]: [phone]       Answering Service: [phone]

## 2021-04-25 NOTE — Progress Notes (Signed)
PROGRESS NOTE    Date Time: 04/25/21 5:52 PM  Patient Name: [name], [name]  Patient status: [name] Hospital Day: 0      Assessment:   Chest Pain   Acute encephalopathy , status post RRT , found with hypoxia, head CT negative.  Status post mechanical fall , slipped in bathroom injury to lower lip, chin.  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  Hyperkalemia.  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   Encephalopathy resolved  Fall precautions, OT PT.   Troponin , tele   Continue vancomycin at dialysis.  Hyperkalemia improved.  Seen by podiatry noninvasive study  Seen by pulmonary for subcentimeter pulmonary nodule, outpatient follow-up in 6 months to 1 year suggested  Increase Lantus continue sliding scale coverage  On amlodipine Coreg, monitor blood pressure.  On Rocephin  , [place] doxycycline, Vanco with dialysis .  Continue statin.  Monitor H&H and platelet count.  States she does not use CPAP at home.  DVT prophylaxis  Discharge planning when stable  Labs and data reviewed.  Discussed with patient and nurse  Discussed with Dr. Boneta Lucks   Cardiology consultation requested  Subjective:   Patient seen and examined :  Complain of substernal and left-sided chest pain nonradiating.  Left breast swollen from falling   Review of Systems:   General ROS: No fever or chills.  Complain of chest pain.  No shortness of breath, cough,  wheezing.  Denies nausea vomiting abdominal pain.  Denies gross focal neuro symptoms.  Generalized weakness.    Medications:     Current Facility-Administered Medications   Medication Dose Route Frequency    amLODIPine  10 mg Oral Daily    atorvastatin  40 mg Oral Daily    azithromycin  500 mg Intravenous Q24H    carvedilol  25 mg Oral Q12H SCH    cefTRIAXone  1 g Intravenous Q24H    dextrose  50 g Intravenous Once    gabapentin  100 mg Oral Q8H [place]    heparin (porcine)  5,000 Units Subcutaneous Q12H [place]    insulin glargine  30 Units Subcutaneous QHS    insulin lispro  1-3 Units Subcutaneous QHS    insulin lispro  1-5 Units Subcutaneous TID AC    insulin lispro  5 Units Subcutaneous TID AC    petrolatum   Topical Q12H    senna-docusate  2 tablet Oral QHS    sevelamer  800 mg Oral BID Meals    vancomycin   Intravenous See Admin Instructions       acetaminophen **OR** acetaminophen, albuterol-ipratropium, dextrose **OR** dextrose **OR** dextrose **OR** glucagon (rDNA), dextrose **OR** dextrose **OR** dextrose **OR** glucagon (rDNA), lactulose, magnesium sulfate, melatonin, naloxone, ondansetron **OR** ondansetron, oxyCODONE-acetaminophen, polyethylene glycol    Physical Exam:   BP 123/64   Pulse 63   Temp 97.8 F (36.6 C)   Resp 17  Ht 1.575 m (5\' 2" )   Wt 109.8 kg (242 lb 1 oz)   LMP  (LMP Unknown) Comment: post menopausal  SpO2 100%   BMI 44.27 kg/m     Intake and Output Summary (Last 24 hours) at Date Time    Intake/Output Summary (Last 24 hours) at 04/25/2021 1752  Last data filed at 04/25/2021 1115  Gross per 24 hour   Intake 118 ml   Output 3000 ml   Net -2882 ml       General: Awake alert oriented x3.  Obese, BMI 44.27.  Neck: Supple no JVD  ENT: PERRLA, EOMI  Lungs: Clear to auscultation no crackles no wheezing  Heart: Regular rate and rhythm no gallop  Breast, left breast is tender or swollen.  Abdomen: Soft nontender dressing on abscess site bowel sounds positive  Extremities: Warm moist neuro:  Grossly intact    Labs:     CBC w/Diff CMP   Recent Labs   Lab 04/24/21  0053 04/24/21  0002 04/23/21  1352   WBC 6.15 6.44 6.41   Hgb 11.3* 10.9* 11.1*   Hematocrit 37.4 35.6 36.2   Platelets 98* 94* 103*   MCV 94.7 94.2 94.0   Neutrophils 79.6 75.3 70.5       PT/INR         Recent Labs   Lab 04/24/21  2119 04/24/21  1223 04/24/21  0828 04/24/21  0053 04/23/21  1352   Sodium 136 137 135 135 136   Potassium 5.4* 6.3* 6.6* 5.8* 5.8*   Chloride 98* 101 99 100 99   CO2 22 23 21 23 23    BUN 46.0* 65.0* 64.0* 60.0* 57.0*   Creatinine 6.5* 8.5* 8.4* 8.2* 7.6*   Glucose 468* 381* 367* 327* 320*   Calcium 8.6 8.4* 8.3* 8.4* 8.4*   Protein, Total  --   --   --  6.4 6.5   Albumin  --   --   --  2.6* 2.7*   AST (SGOT)  --   --   --  24 20   ALT  --   --   --  32 31   Alkaline Phosphatase  --   --   --  297* 279*   Bilirubin, Total  --   --   --  0.3 0.6      Glucose POCT   Recent Labs   Lab 04/24/21  2119 04/24/21  1223 04/24/21  0828 04/24/21  0053 04/23/21  1352   Glucose 468* 381* 367* 327* 320*        Recent Labs   Lab 04/25/21  0356 04/24/21  1834   hs Troponin-I 49.8* 58.6*       Rads:   US Venous Low Extrem Duplex Dopp Ltd Bilat    Result Date: 04/25/2021   NORMAL VENOUS DUPLEX OF THE LOWER EXTREMITIES. NO EVIDENCE OF INTRALUMINAL THROMBUS OR OBSTRUCTION TO VENOUS FLOW IN RIGHT OR LEFT LOWER EXTREMITY. Nelma Rothman, MD 04/25/2021 1:24 PM    CT Angiogram Chest    Result Date: 04/24/2021  1.Negative for acute pulmonary embolism. 2.Worsening aeration of both lungs with increase in bilateral dependent atelectasis. Ongoing septal wall thickening suggestive of volume overload/pulmonary edema. Likely new trace right pleural effusion. 3.Subtle improvement but significant residual soft tissue stranding and skin thickening in the left chest wall/breast. 4.Unchanged mediastinal, hilar, and left axillary lymphadenopathy. Nelya Ebadirad 04/24/2021 8:31 PM       Shadawn Hanaway Felipe Drone, MD  04/25/2021    *  This note was generated by the Epic EMR  system/ Dragon speech recognition and may contain inherent errors or omissions not intended by the user. Grammatical errors, random word insertions, deletions, pronoun errors and incomplete sentences are occasional consequences of this technology due to software limitations. Not all errors are caught or corrected. If there are questions or concerns about the content of this note or information contained within the body of this dictation they should be addressed directly with the author for clarification

## 2021-04-25 NOTE — UM Notes (Addendum)
[facility] Utilization Review   NPI #[id number], Tax ID [id number]  Please call Milas Gain MSN RN CCM @  [phone]  with any questions or concerns.  Email:  [email or web address]  Fax final authorization and requests for additional information to [phone]    CONCURRENT REVIEW FOR: 04/25/21        PATIENT NAME: Ilyas,Janayah L / 08/12/1955 / AGE: 66 y.o.      V/S:  Vital Sign Min/Max (last 24 hours)    Value Min Max   Temp 97.7 F (36.5 C) 98.2 F (36.8 C)   Heart Rate 58 Abnormal  80   Resp Rate 7 Abnormal  60 Abnormal    BP: Systolic 656 812 Abnormal    BP: Diastolic 50 91 Abnormal    FiO2 -- --   SpO2 96 % 100 %     Labs -   Latest Reference Range & Units 04/24/21 21:19 04/25/21 03:56   Glucose 70 - 100 mg/dL 468 (H)    BUN 7.0 - 21.0 mg/dL 46.0 (H)    Creatinine 0.4 - 1.0 mg/dL 6.5 (H)    Potassium 3.5 - 5.3 mEq/L 5.4 (H)    Chloride 99 - 111 mEq/L 98 (L)    Anion Gap 5.0 - 15.0  16.0 (H)    EGFR  7.7    hs Troponin-I SEE BELOW ng/L  49.8 !     Radiologic Studies -   CT Angiogram Chest    Result Date: 04/24/2021  1.Negative for acute pulmonary embolism. 2.Worsening aeration of both lungs with increase in bilateral dependent atelectasis. Ongoing septal wall thickening suggestive of volume overload/pulmonary edema. Likely new trace right pleural effusion. 3.Subtle improvement but significant residual soft tissue stranding and skin thickening in the left chest wall/breast. 4.Unchanged mediastinal, hilar, and left axillary lymphadenopathy. Nelya Ebadirad 04/24/2021 8:31 PM    CT Head WO Contrast    Result Date: 04/24/2021   No acute intracranial abnormality. Nelya Ebadirad 04/24/2021 6:16 PM          MD NOTES:  PER NEPHROLOGY  Assessment:      ESRD on HD MWF at [facility] - followed by Dr. Randel Pigg  Hyperkalemia due to missed HD & hyperglycemia- last HD on 3/29  Mild hypocalcemia due to CKD  Anemia in CKD, hgb at goal  HTN - controlled  DM type 2 with hyperglycemia  Hyperphosphatemia: PTH  1579 on 3/23  Secondary hyperparathyroidism  Fall   Abdominal wall abscess MRSA on vancomycin      Recommendations:    HD 4/2 for hyperkalemia  HD routine today per MWF schedule   UF tomorrow , remains on supp oxygen   Assess daily for extra UF needs  Vanc per level        Podiatric Surgery Consultation  Assessment: ZELDA REAMES is a 66 y.o. female with right hallux dry eschar and loose toenail   Plan:    -. Pt S & E. Right hallux toenail avulsed. Will leave dry eschar to air. Will order noninvasives to assess bloodflow.   WBAT      PULM. CCM PROGRESS NOTE  Plan:     .  Subcentimeter pulmonary nodules, follow-up CT as an outpatient between 6 months to 1 year  .  Atelectasis of both lungs, continue incentive spirometry  .  End-stage renal disease, dialysis dependence  .  Hypertension, on amlodipine, Coreg, continue to monitor blood pressure  .  Hyperlipidemia, on statin  .  Atypical chest  pain, status post fall with trauma to the chance, probably musculoskeletal chest pain, monitor  .  Continue nocturnal CPAP  .  Discontinue steroids    Current Medications:    Scheduled Meds:  Current Facility-Administered Medications   Medication Dose Route Frequency    amLODIPine  10 mg Oral Daily    atorvastatin  40 mg Oral Daily    azithromycin  500 mg Intravenous Q24H    carvedilol  25 mg Oral Q12H SCH    cefTRIAXone  1 g Intravenous Q24H    dextrose  50 g Intravenous Once    doxycycline  100 mg Oral Q12H SCH    gabapentin  100 mg Oral Q8H [place]    heparin (porcine)  5,000 Units Subcutaneous Q12H [place]    insulin glargine  30 Units Subcutaneous QHS    insulin lispro  1-3 Units Subcutaneous QHS    insulin lispro  1-5 Units Subcutaneous TID AC    insulin lispro  5 Units Subcutaneous TID AC    methylPREDNISolone  40 mg Intravenous Q8H    petrolatum   Topical Q12H    senna-docusate  2 tablet Oral QHS    sevelamer  800 mg Oral BID Meals    vancomycin   Intravenous See Admin Instructions

## 2021-04-25 NOTE — Consults (Signed)
[place] [address]  [place]: [phone]      Date Time: 04/25/21 11:40 AM  Patient Name: [name]  Requesting Physician: Barbaraann Faster, MD    Reason for Consultation:   Chest pain.    History of Present Illness:   [name] is a 66 y.o. female admitted on 04/23/2021.  We have been asked by Barbaraann Faster, MD to provide cardiac consultation regarding chest pain.   The patient presented to the hospital after falling in the shower. She reports she tripped and fell without loss of consciousness. The patient suffered trauma to her lip and chest.    She had medical history of ESRD, diabetes, COPD, hypertension.    EKG on admission:  Sinus rhythm  Normal axis  Normal R wave progression  Low voltage  No acute STT changes  Similar to prior    HS trop: 58 --> 49  Past Medical/Surgical History:   Patient  has a past medical history of Chronic obstructive pulmonary disease, Diabetes mellitus, Hyperlipidemia, Hypertension, and Sleep apnea (2018).    Patient  has a past surgical history that includes Ovary surgery (Left); Joint replacement; Joint replacement; EGD, BIOPSY (N/A, 05/14/2019); Tunneled Cath Placement (Permcath) (Bilateral, 05/23/2019); REMOVAL, FOREIGN BODY (N/A, 05/27/2019); EXPLORATORY LAPAROTOMY (N/A, 05/27/2019); Tunneled Cath Check/Change (Permcath) (N/A, 07/03/2019); and Tunneled Cath Placement (Permcath) (Right, 07/31/2019).    Family History:   Patient's family history includes Diabetes in her brother, father, mother, paternal aunt, paternal uncle, and sister; Hypertension in her brother, father, mother, and sister.    Social History:   Patient  reports that she has never smoked. She has never used smokeless tobacco. She reports that she does not drink alcohol and does not use drugs.    Allergies:     Allergies   Allergen Reactions    Lisinopril Hives    Metformin  Hives       Medications:     Current Outpatient Medications   Medication Instructions    acetaminophen (TYLENOL) 500 mg, Oral, Every 6 hours PRN    albuterol sulfate HFA (PROVENTIL) 108 (90 Base) MCG/ACT inhaler 2 puffs, Inhalation, Every 6 hours PRN    amLODIPine (NORVASC) 10 MG tablet 1 tablet, Oral, Daily    aspirin 81 mg, Oral, Daily    atorvastatin (LIPITOR) 40 mg, Oral, Daily    carvedilol (COREG) 25 mg, Oral, Every 12 hours    dextromethorphan-guaiFENesin (MUCINEX DM) 30-600 MG per 12 hr tablet 1 tablet, Oral, Every 12 hours    gabapentin (NEURONTIN) 100 mg, Oral, Every 8 hours scheduled    insulin aspart (NOVOLOG) 15 Units, Subcutaneous, 3 times daily before meals    Insulin Degludec (TRESIBA SC) 35 Unit, Subcutaneous    lactobacillus/streptococcus (RISAQUAD) Cap 1 capsule, Oral, Daily    mupirocin (BACTROBAN) 2 % ointment Topical, Daily, USe BID for 2 Weeks    oxyCODONE-acetaminophen (PERCOCET) 5-325 MG per tablet 1 tablet, Oral, Every 4 hours PRN    pantoprazole (PROTONIX) 40 mg, Oral, Daily    petrolatum (AQUAPHOR) ointment Topical, Every 12 hours    sevelamer (RENVELA) 800 mg, Oral, 2 times daily with meals       Inpatient Scheduled Meds: PRN Meds:    amLODIPine, 10 mg, Oral, Daily  atorvastatin, 40 mg, Oral, Daily  azithromycin, 500 mg, Intravenous, Q24H  carvedilol, 25 mg, Oral, Q12H SCH  cefTRIAXone, 1 g, Intravenous,  Q24H  dextrose, 50 g, Intravenous, Once  doxycycline, 100 mg, Oral, Q12H SCH  gabapentin, 100 mg, Oral, Q8H SCH  heparin (porcine), 5,000 Units, Subcutaneous, Q12H SCH  insulin glargine, 30 Units, Subcutaneous, QHS  insulin lispro, 1-3 Units, Subcutaneous, QHS  insulin lispro, 1-5 Units, Subcutaneous, TID AC  insulin lispro, 5 Units, Subcutaneous, TID AC  methylPREDNISolone, 40 mg, Intravenous, Q8H  senna-docusate, 2 tablet, Oral, QHS  sevelamer, 800 mg, Oral, BID Meals  vancomycin, , Intravenous, See Admin Instructions        Continuous Infusions:   acetaminophen, 650 mg, Q6H PRN    Or  acetaminophen, 650 mg, Q6H PRN  albuterol-ipratropium, 3 mL, Q6H PRN  dextrose, 15 g of glucose, PRN   Or  dextrose, 12.5 g, PRN   Or  dextrose, 12.5 g, PRN   Or  glucagon (rDNA), 1 mg, PRN  dextrose, 15 g of glucose, PRN   Or  dextrose, 12.5 g, PRN   Or  dextrose, 12.5 g, PRN   Or  glucagon (rDNA), 1 mg, PRN  lactulose, 10 g, Daily PRN  magnesium sulfate, 1 g, PRN  melatonin, 3 mg, QHS PRN  naloxone, 0.2 mg, PRN  ondansetron, 4 mg, Q6H PRN   Or  ondansetron, 4 mg, Q6H PRN  oxyCODONE-acetaminophen, 1 tablet, Q4H PRN  polyethylene glycol, 17 g, Daily PRN  sodium chloride, 100 mL, Q1H PRN  sodium chloride, 250 mL, PRN          Physical Exam:     Vitals:    04/25/21 1030 04/25/21 1045 04/25/21 1100 04/25/21 1115   BP: 112/61 112/57 108/55 123/64   Pulse: 61 63 63 63   Resp: 12 17 12 17    Temp:    97.8 F (36.6 C)   TempSrc:       SpO2: 100% 100% 100% 100%   Weight:       Height:           Constitutional: Cooperative, alert, no acute distress. Lip laceration.  Neck: No carotid bruits, JVP normal.  Cardiac: Regular rate and rhythm, normal S1 and S2; no S3 or S4, no murmurs, no rubs, no gallops.The chest is exquisitely tender to palpation. The patient winces and withdraws to pain with palpation.  Pulmonary: Clear to auscultation bilaterally, no wheezing, no rhonchi, no rales.  Extremities: no edema.  Vascular: +2 pulses in radial artery bilaterally.    Lab/Test Findings:     ECG reviewed: See above    CT chest:  1.Negative for acute pulmonary embolism.  2.Worsening aeration of both lungs with increase in bilateral dependent  atelectasis. Ongoing septal wall thickening suggestive of volume  overload/pulmonary edema. Likely new trace right pleural effusion.  3.Subtle improvement but significant residual soft tissue stranding and  skin thickening in the left chest wall/breast.  4.Unchanged mediastinal, hilar, and left axillary lymphadenopathy.      Echo 02/05/2021:   * Borderline right ventricular function.    * Normal  left ventricular ejection fraction.    * No wall motion abnormalities seen.    * Mild-Moderate tricuspid regurgitation.    * By Simpson's, LV EF is 55%.    Recent Labs     04/25/21  0356 04/24/21  2119 04/24/21  0828 04/24/21  0053   Sodium  --  136   < > 135   Potassium  --  5.4*   < > 5.8*   Chloride  --  98*   < > 100   CO2  --  22   < > 23   BUN  --  46.0*   < > 60.0*   Creatinine  --  6.5*   < > 8.2*   Calcium  --  8.6   < > 8.4*   WBC  --   --   --  6.15   Hgb  --   --   --  11.3*   Hematocrit  --   --   --  37.4   Platelets  --   --   --  98*   hs Troponin-I 49.8*  --    < >  --    AST (SGOT)  --   --   --  24   ALT  --   --   --  32    < > = values in this interval not displayed.       ASSESSMENT:   Patient is a 66 y.o. female with the following relevant diagnoses:    Non-anginal chest pain - no EKG changes or significant troponin elevation to suggest ischemia. Patient has known chest contusion from fall and chest pain is reproducible with palpation.  ESRD  COPD  Diabetes  Morbid obesity    Admitted with mechanical fall.    RECOMMENDATIONS:     Chest pain is non-anginal. No further chest pain evaluation at this time.   Volume management with dialysis  Continue statin.  Please call with questions.    -------------------------------------------------------------------------------------  Signed by:         Jamelle Rushing, MD         [place] (Group):   [facility]    APP [place]:  [phone]    MD [place] :  [phone]  [phone]    After hours, non urgent consult line:  [phone]    After hours, physician on-call:  [place] (Group):   [facility]    APP [place]:  [phone]    MD [place] :  [phone]      After hours, non urgent consult line:  [phone]    After hours, physician on-call:  [place] (Group):   [facility]    APP  [place]:  [phone]    MD [place] :  [phone]      After hours, non urgent consult line:  [phone]    After hours, physician on-call:  [place] (Group):   [facility]    APP [place]:  [phone]    MD [place] :  [phone]      After hours, non urgent consult line:  [phone]    After hours, physician on-call:  [phone]       This note was generated by the Dragon speech recognition and may contain errors or omissions not intended by the user. Grammatical errors, random word insertions, deletions, pronoun errors, and incomplete sentences are occasional consequences of this technology due to software limitations. Not all errors are caught or corrected. If there are questions or concerns about the content of this note or information contained within the body of this dictation, they should be addressed directly with the author for clarification.

## 2021-04-25 NOTE — OT Eval Note (Signed)
[facility]   Occupational Therapy Attempt Note    Patient:  [name] MRN#:  [id number]  Unit:  [facility] [place] [facility] INTERMEDIATE CARE Room/Bed:  G9978/L4663-F      Occupational Therapy evaluation attempted on 04/25/2021 at 9:54 AM unable to complete secondary to  pt undergoing dialysis.   Physical and/or occupational therapy will follow up later today as able.    RN aware.      Teena Irani, MSOT OTR/L    Physical Medicine and [facility]  T [phone]; ThF [phone]  [phone]    04/25/2021  9:55 AM

## 2021-04-25 NOTE — Consults (Signed)
Wound Ostomy Continence Consultation    Date Time: 04/25/21 10:39 AM  Patient Name: [name], [name]  Consulting Service: [facility] Day: 3     Reason for Consult   Abdomen and toes     Assessment & Plan   Assessment:   Patient assessed in her room resting in bed while getting dialysis. She was wake and alert, but lethargic and seemed to repeat words. She is able to move all extremities, admitted after a fall.    She injured her right great toe during the fall, and the toenail is mostly separated from her toe. There is dried sanguinous fluid over her nail bed. There also appears to be a wound under the lateral plantar part of the toe with black eschar. The toe has some ruddy discoloration, but not bright red. It is tender, but not excruciating. There is no drainage noted. The toenial will likely come off in a few days with routine care. Xeroform can be used before or after the nail is removed.    She has an old surgical wound that recently reopened on her abdomen. A week or two ago, it appeared larger with a small amount of drainage present in the wound bed. At this time, the wound bed is generally dry, with some sticky effluent in/around the hole. The hole can barely be probed at this time. It is nearly closed.    Wound Photography:   Abdominal reopened wound, very little probeable, scant drainage        Right great toe, toenail mostly separated. Toe injury.        Plan/Follow-Up:   Skin Care to legs and feet:   Apply Aquaphor after AM/PM care. DO NOT apply between toes.   Cover right great toe with Xeroform (with or without toenail). Cover with gauze if needed.   May cover abdomen with foam and Maxorb if needed.     Aquaphor Healing Ointment is uniquely formulated to restore smooth, healthy skin. This multi-purpose ointment protects and soothes extremely dry skin, chapped lips, cracked hands and feet, minor cuts and burns, and many other skin irritations.     Initiate/ continue pressure prevention  bundle:  Head of the bed 30 degrees or less  Positioning device to the bedside  Eliminate/minimize pressure from the area  Float heels with boots or pillows  Turn patient  Pressure redistribution cushion to the chair  Use lift sheets/low friction surface sheets for positioning  Pad bony prominences  Nutrition consult/Optimize nutrition  Initiate bed algorithm/Specialty bed  Moisture/Incontinence management - Cleanse with incontinence cleansing wipe/water to manage incontinence and to protect skin from exposure to urine and stool. Apply skin barrier protection cream. Apply [place]/external or female external urine management system to prevent urinary contamination of a wound. Apply rectal pouch/fecal management system per unit policy, to prevent fecal contamination of a wound or to contain diarrhea.    Objective Findings   Braden: Braden Scale Score: 14 (04/25/21 0436)  Braden Subscales:  Sensory Perceptions: Slightly limited (04/25/21 0436)  Moisture: Occasionally moist (04/25/21 0436)  Activity: Bedfast (04/25/21 0436)  Mobility: Very limited (04/25/21 0436)  Nutrition: Adequate (04/25/21 0436)  Friction and Shear: Potential problem (04/25/21 0436)    Ht Readings from Last 1 Encounters:   04/24/21 1.575 m (5\' 2" )     Wt Readings from Last 3 Encounters:   04/25/21 109.8 kg (242 lb 1 oz)   04/18/21 110.6 kg (243 lb 13.4 oz)   03/05/21 107.4 kg (236  lb 12.4 oz)     Body mass index is 44.27 kg/m.    Current Diet:   Supervise For Meals Frequency: All meals  Diet heart healthy Additional restrictions: CONSISTENT CARBOHYDRATE     Mobility: ambulatory at home with assistance, fall risk, admitted for fall    Continence: Dialysis dependant    Current Interventions: pillows, heel foam dressings    Mattress: Centrella mattress    Patient Education: Discussed with the patient and all questioned fully answered.     History of Present Illness   This is a 66 y.o. female  has a past medical history of Chronic obstructive pulmonary  disease, Diabetes mellitus, Hyperlipidemia, Hypertension, and Sleep apnea (2018)..  Admitted with Altered mental status.      From H&P by Dr. Pilar Jarvis Addanki:  [name] is a 66 y.o. female history of COPD, type 2 diabetes mellitus, hypertension, hyperlipidemia, COPD, ESRD-DD MWF presented to the emergency room after a fall.  Patient having left chest pain contusion of the belly and right foot.  Work-up in the ER was negative for any fractures.  CT head was negative.  Patient states she was having a shower when she slipped out.  Patient drowsy falls asleep while conversation.  However able to communicate.  Denies any fevers or chills.  Patient brought in for altered mental status by her husband.  Patient is asking for pain medications.  Unclear if this was a hypoglycemic episode at home.  Admitted for further work-up and management.     Lab   Significant Lab Values:    Recent Labs     04/24/21  2119   Sodium 136   Potassium 5.4*   Chloride 98*   CO2 22   BUN 46.0*   Creatinine 6.5*   Calcium 8.6   EGFR 7.7   Glucose 468*       Hanley Hays, BSN, RN, [employer], [employer]  Inpatient Wound, Ostomy, and Continence Nurse Coordinator  [place]

## 2021-04-25 NOTE — Consults (Signed)
Podiatric Surgery Consultation    Reason for Consultation:  04/25/2021 2:01 PM    Assessment: [name] is a 66 y.o. female with right hallux dry eschar and loose toenail    Plan:    -. Pt S & E. Right hallux toenail avulsed. Will leave dry eschar to air. Will order noninvasives to assess bloodflow.   WBAT   Thank you for the consult.    HPI: [name] is a 66 y.o. year old female presents to [facility] on 04/23/21 for fall. History of DM and ESRD on DD. Currently sleeping and getting Echo at bedside. Denies F/C/N/V/NS/SOB      Past Medical History:   Past Medical History:   Diagnosis Date    Chronic obstructive pulmonary disease     Diabetes mellitus     Hyperlipidemia     Hypertension     Sleep apnea 2018       Past Surgical History:   Past Surgical History:   Procedure Laterality Date    EGD, BIOPSY N/A 05/14/2019    Procedure: EGD, BIOPSY;  Surgeon: Ronie Spies, MD;  Location: [facility];  Service: Gastroenterology;  Laterality: N/A;    EXPLORATORY LAPAROTOMY N/A 05/27/2019    Procedure: EXPLORATORY LAPAROTOMY;  Surgeon: Herbert Moors., MD;  Location: [facility];  Service: General;  Laterality: N/A;    JOINT REPLACEMENT      shoulder    JOINT REPLACEMENT      knee    OVARY SURGERY Left     Removal    REMOVAL, FOREIGN BODY N/A 05/27/2019    Procedure: REMOVAL, PERITONEAL DIALYSIS CATHETER;  Surgeon: Herbert Moors., MD;  Location: [facility];  Service: General;  Laterality: N/A;    TUNNELED CATH CHECK/CHANGE (PERMCATH) N/A 07/03/2019    Procedure: TUNNELED CATH CHECK/CHANGE;  Surgeon: Maureen Ralphs, MD;  Location: [facility];  Service: Interventional Radiology;  Laterality: N/A;    TUNNELED CATH PLACEMENT (PERMCATH) Bilateral 05/23/2019    Procedure: TUNNELED CATH PLACEMENT;  Surgeon: Jacques Earthly, MD;  Location: [facility];  Service: Interventional Radiology;  Laterality: Bilateral;    TUNNELED CATH PLACEMENT (PERMCATH) Right 07/31/2019    Procedure: TUNNELED CATH PLACEMENT;   Surgeon: Jacques Earthly, MD;  Location: [facility];  Service: Interventional Radiology;  Laterality: Right;       Medications:   Home Meds:   Prior to Admission medications    Medication Sig Start Date End Date Taking? Authorizing Provider   acetaminophen (TYLENOL) 500 MG tablet Take 500 mg by mouth every 6 (six) hours as needed for Pain    [provider]   albuterol sulfate HFA (PROVENTIL) 108 (90 Base) MCG/ACT inhaler Inhale 2 puffs into the lungs every 6 (six) hours as needed for Wheezing or Shortness of Breath    [provider]   amLODIPine (NORVASC) 10 MG tablet Take 1 tablet by mouth daily    [provider]   aspirin 81 MG chewable tablet Chew 81 mg by mouth daily    [provider]   atorvastatin (LIPITOR) 40 MG tablet Take 1 tablet (40 mg) by mouth daily 04/19/21   Barbaraann Faster, MD   carvedilol (COREG) 25 MG tablet Take 1 tablet (25 mg) by mouth every 12 (twelve) hours 04/19/21   Barbaraann Faster, MD   dextromethorphan-guaiFENesin (MUCINEX DM) 30-600 MG per 12 hr tablet Take 1 tablet by mouth every 12 (twelve) hours 02/08/21   Chinery, Lawerance Sabal, MD  gabapentin (NEURONTIN) 100 MG capsule Take 1 capsule (100 mg) by mouth every 8 (eight) hours 03/06/21   Barbaraann Faster, MD   insulin aspart (NovoLOG) 100 UNIT/ML injection Inject 15 Units into the skin 3 (three) times daily before meals    [provider]   Insulin Degludec (TRESIBA SC) Inject 35 Unit into the skin    [provider]   lactobacillus/streptococcus (RISAQUAD) Cap Take 1 capsule by mouth daily 04/20/21   Barbaraann Faster, MD   mupirocin (BACTROBAN) 2 % ointment Apply topically daily USe BID for 2 Weeks 04/19/21   Barbaraann Faster, MD   oxyCODONE-acetaminophen (PERCOCET) 5-325 MG per tablet Take 1 tablet by mouth every 4 (four) hours as needed for Pain 04/19/21 04/26/21  Barbaraann Faster, MD   pantoprazole (PROTONIX) 40 MG tablet Take 40 mg by mouth daily    [provider]    petrolatum (AQUAPHOR) ointment Apply topically every 12 (twelve) hours 03/06/21   Barbaraann Faster, MD   sevelamer (RENVELA) 800 MG tablet Take 800 mg by mouth 2 (two) times daily with meals    [provider]      Current Hospital Meds:   Current Facility-Administered Medications:     acetaminophen (TYLENOL) tablet 650 mg, 650 mg, Oral, Q6H PRN **OR** acetaminophen (TYLENOL) suppository 650 mg, 650 mg, Rectal, Q6H PRN, Addanki, Venkateswer R, MD    albuterol-ipratropium (DUO-NEB) 2.5-0.5(3) mg/3 mL nebulizer 3 mL, 3 mL, Nebulization, Q6H PRN, Felipe Drone, Mussarat, MD    amLODIPine (NORVASC) tablet 10 mg, 10 mg, Oral, Daily, Addanki, Venkateswer R, MD, 10 mg at 04/24/21 0813    atorvastatin (LIPITOR) tablet 40 mg, 40 mg, Oral, Daily, Addanki, Venkateswer R, MD, 40 mg at 04/25/21 0852    azithromycin (ZITHROMAX) 500 mg in sodium chloride 0.9 % 250 mL IVPB, 500 mg, Intravenous, Q24H, Wilfred Lacy, MD, Stopped at 04/25/21 0105    carvedilol (COREG) tablet 25 mg, 25 mg, Oral, Q12H [place], Addanki, Venkateswer R, MD, 25 mg at 04/24/21 2133    cefTRIAXone (ROCEPHIN) 1 g in sodium chloride 0.9 % 100 mL IVPB mini-bag plus, 1 g, Intravenous, Q24H, Haynie, Stephani Police, MD, Stopped at 04/24/21 2348    dextrose (GLUCOSE) 40 % oral gel 15 g of glucose, 15 g of glucose, Oral, PRN **OR** dextrose (D10W) 10% bolus 125 mL, 12.5 g, Intravenous, PRN **OR** dextrose 50 % bolus 12.5 g, 12.5 g, Intravenous, PRN **OR** glucagon (rDNA) (GLUCAGEN) injection 1 mg, 1 mg, Intramuscular, PRN, Addanki, Venkateswer R, MD    dextrose (GLUCOSE) 40 % oral gel 15 g of glucose, 15 g of glucose, Oral, PRN **OR** dextrose (D10W) 10% bolus 125 mL, 12.5 g, Intravenous, PRN **OR** dextrose 50 % bolus 12.5 g, 12.5 g, Intravenous, PRN **OR** glucagon (rDNA) (GLUCAGEN) injection 1 mg, 1 mg, Intramuscular, PRN, Addanki, Venkateswer R, MD    [EXPIRED] NSG Communication: Glucose POCT order, , , Q1H **AND** dextrose (D10W) 10% bolus 500 mL, 50 g,  Intravenous, Once **AND** [COMPLETED] insulin regular (HumuLIN R) 5 Units intravenous injection, 5 Units, Intravenous, Once, Barbaraann Faster, MD, 5 Units at 04/24/21 1210    doxycycline (MONODOX) capsule 100 mg, 100 mg, Oral, Q12H [place], Felipe Drone, Mussarat, MD, 100 mg at 04/25/21 0852    gabapentin (NEURONTIN) capsule 100 mg, 100 mg, Oral, Q8H [place], Tahira, Mussarat, MD, 100 mg at 04/25/21 0532    heparin (porcine) injection 5,000 Units, 5,000 Units, Subcutaneous, Q12H [place], Addanki, Venkateswer R, MD, 5,000 Units at 04/25/21 0852    insulin glargine (  LANTUS) injection 30 Units, 30 Units, Subcutaneous, QHS, Barbaraann Faster, MD, 30 Units at 04/24/21 2136    insulin lispro injection 1-3 Units, 1-3 Units, Subcutaneous, QHS, 3 Units at 04/24/21 2136 **AND** NSG Communication: Glucose POCT order, , , At bedtime, Addanki, Venkateswer R, MD    insulin lispro injection 1-5 Units, 1-5 Units, Subcutaneous, TID AC, 4 Units at 04/25/21 1220 **AND** NSG Communication: Glucose POCT order, , , AC, Addanki, Venkateswer R, MD    insulin lispro injection 5 Units, 5 Units, Subcutaneous, TID AC, Barbaraann Faster, MD, 5 Units at 04/25/21 0851    lactulose (CHRONULAC) 10 GM/15ML solution 10 g, 10 g, Oral, Daily PRN, Addanki, Venkateswer R, MD    magnesium sulfate 1g in dextrose 5% 185mL IVPB (premix), 1 g, Intravenous, PRN, Addanki, Venkateswer R, MD    melatonin tablet 3 mg, 3 mg, Oral, QHS PRN, Addanki, Venkateswer R, MD    methylPREDNISolone sodium succinate (Solu-MEDROL) injection 40 mg, 40 mg, Intravenous, Q8H, Haynie, Jennifer L, MD, 40 mg at 04/25/21 0852    naloxone (NARCAN) injection 0.2 mg, 0.2 mg, Intravenous, PRN, Addanki, Venkateswer R, MD    ondansetron (ZOFRAN-ODT) disintegrating tablet 4 mg, 4 mg, Oral, Q6H PRN **OR** ondansetron (ZOFRAN) injection 4 mg, 4 mg, Intravenous, Q6H PRN, Addanki, Venkateswer R, MD    oxyCODONE-acetaminophen (PERCOCET) 5-325 MG per tablet 1 tablet, 1 tablet, Oral, Q4H PRN, Barbaraann Faster, MD, 1  tablet at 04/25/21 0730    petrolatum (AQUAPHOR) ointment, , Topical, Q12H, Tahira, Mussarat, MD    polyethylene glycol (MIRALAX) packet 17 g, 17 g, Oral, Daily PRN, Addanki, Venkateswer R, MD    senna-docusate (PERICOLACE) 8.6-50 MG per tablet 2 tablet, 2 tablet, Oral, QHS, Addanki, Venkateswer R, MD, 2 tablet at 04/24/21 2134    sevelamer (RENVELA) tablet 800 mg, 800 mg, Oral, BID Meals, Addanki, Venkateswer R, MD, 800 mg at 04/25/21 0858    sodium chloride 0.9 % bolus 100 mL, 100 mL, Intravenous, Q1H PRN, Debroah Baller L, MD    sodium chloride 0.9 % bolus 250 mL, 250 mL, Intravenous, PRN, Verl Blalock, MD    vancomycin Zollie Pee) therapy dosed by pharmacy, , Intravenous, See Admin Instructions, Barbaraann Faster, MD      Allergies:   Allergies   Allergen Reactions    Lisinopril Hives    Metformin Hives       Family History:   Family History   Problem Relation Age of Onset    Diabetes Mother     Hypertension Mother     Diabetes Father     Hypertension Father     Diabetes Sister     Hypertension Sister     Diabetes Brother     Hypertension Brother     Diabetes Paternal Aunt     Diabetes Paternal Uncle        Social History:   Social History     Socioeconomic History    Marital status: Legally Separated   Tobacco Use    Smoking status: Never    Smokeless tobacco: Never   Vaping Use    Vaping status: Never Used   Substance and Sexual Activity    Alcohol use: Never    Drug use: Never    Sexual activity: Not Currently       Review of Systems:  Negative for chest pain and shortness of breath    Physical exam:       Patient is a 66 y.o. year old female who is alert, well appearing,  and in no distress.  Vitals:    04/25/21 1115   BP: 123/64   Pulse: 63   Resp: 17   Temp: 97.8 F (36.6 C)   SpO2: 100%       109.8 kg (242 lb 1 oz)   1.575 m (5\' 2" )  Estimated body mass index is 44.27 kg/m as calculated from the following:    Height as of this encounter: 1.575 m (5\' 2" ).    Weight as of this encounter: 109.8  kg (242 lb 1 oz).    Right Lower Extremity:   Vascular: Dorsalis pedis: weak, Posterior Tibial:weak. Pedal edema is moderate.     Derm:  normal. No ascending cellulitis.    Right hallux nail mostly loose with distal eschar dry no purulence or drainage   Ortho: Gross motor intact.  Tenderness to palpation none   Neuro: Protective sensation diminished.     US Venous Low Extrem Duplex Dopp Ltd Bilat    Result Date: 04/25/2021   NORMAL VENOUS DUPLEX OF THE LOWER EXTREMITIES. NO EVIDENCE OF INTRALUMINAL THROMBUS OR OBSTRUCTION TO VENOUS FLOW IN RIGHT OR LEFT LOWER EXTREMITY. Nelma Rothman, MD 04/25/2021 1:24 PM    CT Angiogram Chest    Result Date: 04/24/2021  1.Negative for acute pulmonary embolism. 2.Worsening aeration of both lungs with increase in bilateral dependent atelectasis. Ongoing septal wall thickening suggestive of volume overload/pulmonary edema. Likely new trace right pleural effusion. 3.Subtle improvement but significant residual soft tissue stranding and skin thickening in the left chest wall/breast. 4.Unchanged mediastinal, hilar, and left axillary lymphadenopathy. Nelya Ebadirad 04/24/2021 8:31 PM    CT Head WO Contrast    Result Date: 04/24/2021   No acute intracranial abnormality. Nelya Ebadirad 04/24/2021 6:16 PM    XR Chest AP Portable    Result Date: 04/23/2021   No acute findings. Evaluation slightly limited due to poor penetration. Jeanella Cara, MD 04/23/2021 11:03 PM    CT Hip Right WO Contrast    Result Date: 04/23/2021  No acute fractures or subluxation right hip. CPPD. Anasarca. Macon Large, MD 04/23/2021 5:54 PM    CT Abdomen Pelvis WO Contrast    Result Date: 04/23/2021   No solid or viscus organ injury. No free free air. Trace of free fluid. Anasarca. Scarring in the left paramedian abdominal wall with a 1 cm area of low attenuation in the subcutaneous tissues which may represent residual fluid. A smaller than on prior exam. Abnormal appearing left breast. Please see CT chest report dated  04/23/2021. Cholelithiasis. Small hiatal hernia Diastases abdominis rectus.Macon Large, MD 04/23/2021 5:51 PM    CT Chest without Contrast    Result Date: 04/23/2021  Groundglass opacities and interlobular septal thickening suggesting mild to moderate pulmonary edema. Bilateral pulmonary nodules. Follow-up as per Fleischner Society protocol. Enlarged left breast with skin thickening and extensive fat stranding with subpectoral, left breast and mediastinal adenopathy. Differential diagnosis includes left breast contusion from trauma versus neoplasm versus mastitis. I would recommend further workup of these findings at a dedicated [place] with mammograms and ultrasound. Findings were discussed with Dr. Hartford Poli 5:35 PM 05/23/2021. Enlarged mediastinal and hilar lymph nodes. Follow-up to exclude any myeloproliferative or neoplastic process. Small hiatal hernia. Cholelithiasis. Fleischner Society pulmonary nodule guidelines (revised 2017): Multiple solid nodules less than 6 mm: Low risk for lung cancer: No follow-up. High risk for lung cancer: Optional chest CT in12 months. Macon Large, MD 04/23/2021 5:38 PM    XR Pelvis  Limited 1 Or 2 Views    Result Date: 04/23/2021   No definite fracture is demonstrated. The study is however significantly limited by underpenetration. Additional radiographs or CT are recommended. Steward Drone, MD 04/23/2021 3:54 PM    Ribs Right/PA Chest    Result Date: 04/23/2021   Limited study due to osteopenia and soft tissue density. No fracture seen. Jennell Corner, MD 04/23/2021 3:37 PM    CT Head without Contrast    Result Date: 04/23/2021   Unremarkable noncontrast CT of the brain. Steward Drone, MD 04/23/2021 2:27 PM     Labs:    Results       Procedure Component Value Units Date/Time    Glucose Whole Blood - POCT [[id number]]  (Abnormal) Collected: 04/25/21 1115     Updated: 04/25/21 1124     Whole Blood Glucose POCT 340 mg/dL     Glucose Whole Blood - POCT [[id number]]   (Abnormal) Collected: 04/25/21 0723     Updated: 04/25/21 0739     Whole Blood Glucose POCT 446 mg/dL     Vancomycin, random [[id number]] Collected: 04/25/21 0356    Specimen: Blood Updated: 04/25/21 0459     Vancomycin Random 26.4 ug/mL      Vancomycin Time of Last Dose unknown     Vancomycin Date of Last Dose 04/24/2021    High Sensitivity Troponin-I (Every 6 hours) [[id number]]  (Abnormal) Collected: 04/25/21 0356    Specimen: Blood Updated: 04/25/21 0436     hs Troponin-I 49.8 ng/L     GFR [[id number]] Collected: 04/24/21 2119     Updated: 04/24/21 2139     EGFR 7.7    Basic Metabolic Panel [[id number]]  (Abnormal) Collected: 04/24/21 2119    Specimen: Blood Updated: 04/24/21 2139     Glucose 468 mg/dL      BUN 46.0 mg/dL      Creatinine 6.5 mg/dL      Calcium 8.6 mg/dL      Sodium 136 mEq/L      Potassium 5.4 mEq/L      Chloride 98 mEq/L      CO2 22 mEq/L      Anion Gap 16.0    Glucose Whole Blood - POCT [[id number]]  (Abnormal) Collected: 04/24/21 2118     Updated: 04/24/21 2121     Whole Blood Glucose POCT 474 mg/dL     High Sensitivity Troponin-I (Every 6 hours) [[id number]]  (Abnormal) Collected: 04/24/21 1834    Specimen: Blood Updated: 04/24/21 1907     hs Troponin-I 58.6 ng/L     Glucose Whole Blood - POCT [[id number]]  (Abnormal) Collected: 04/24/21 1537     Updated: 04/24/21 1539     Whole Blood Glucose POCT 409 mg/dL     Vancomycin, random [[id number]] Collected: 04/24/21 1401    Specimen: Blood Updated: 04/24/21 1448     Vancomycin Random 20.4 ug/mL      Vancomycin Time of Last Dose unk     Vancomycin Date of Last Dose 04/22/2021                      Ladona Horns, DPM

## 2021-04-25 NOTE — Progress Notes (Signed)
3.5 hours HD with no HD related complications noted. BP improved post removal of 3 liters as ordered. Still in pain from her rib cage especially when moving. Seen by multiple doctores during the procedure including renal attending., treatment plan was discussed. Report given to floor  nurse. Patient is stable.    04/25/21 1115   Treatment Summary   Time Off Machine 1115   Duration of Treatment (Hours) 3.5   Treatment Type 1:1   Dialyzer Clearance Heavily streaked   Fluid Volume Off (mL) 3400   Prime Volume (mL) 200   Rinseback Volume (mL) 200   Fluid Given: Normal Saline (mL) 0   Fluid Given: PRBC  0 mL   Fluid Given: Albumin (mL) 0   Fluid Given: Other (mL) 0   Total Fluid Given 400   Hemodialysis Net Fluid Removed 3000   Post Treatment Assessment   Post-Treatment Weight (Kg) 105.8   Patient Response to Treatment stable   Additional Dialyzer Used none   Graft/Fistula Left   No placement date or time found.   Access Type: Arteriovenous Fistula  Orientation: Left  Graft/Fistula Location: Upper Arm   Fistula/ Graft Assessment Abnormalities WDL   Needle Size 15 Gauge   Cannulation Sites held Arterial (min) 10   Cannulation Sites held Venous (min) 10   Hemostasis Achieved Yes   Vitals   Temp 97.8 F (36.6 C)   Heart Rate 63   Resp Rate 17   BP 123/64   SpO2 100 %   Assessment   Cardiac (WDL) WDL   Cardiac Regularity Regular   Cardiac Symptoms None   Cardiac Rhythm Normal Sinus Rhythm   Respiratory  WDL   Respiratory Pattern Regular   Bilateral Breath Sounds Clear;Diminished   RLE Edema +1   LLE Edema +1   General Skin Color Appropriate for ethnicity   Skin Condition/Temp Warm;Dry   Abdomen Inspection Soft;Rounded   Pain Assessment   Charting Type Assessment   Pain Scale Used Numeric Scale (0-10)   Numeric Pain Scale   Pain Score 4   POSS Score 1   Pain Location Chest  (rib cage)   Pain Descriptors Aching   Education   Person taught Patient   Knowledge basis Substantial   Topics taught Access care;Fluid Management    Teaching Tools Explain   Reponse Verbalizes Understanding   Bedside Nurse Communication   Name of bedside RN - post dialysis Worthy Flank

## 2021-04-25 NOTE — Plan of Care (Signed)
Problem: Nutrition  Goal: Nutritional intake is adequate  Outcome: Progressing  Flowsheets (Taken 04/25/2021 0939)  Nutritional intake is adequate:   Monitor daily weights   Allow adequate time for meals     Problem: Renal Instability  Goal: Nutritional intake is adequate  Outcome: Progressing  Flowsheets (Taken 04/25/2021 0939)  Nutritional intake is adequate:   Monitor daily weights   Allow adequate time for meals  Goal: Fluid and electrolyte balance are achieved/maintained  Outcome: Progressing  Flowsheets (Taken 04/25/2021 0939)  Fluid and electrolyte balance are achieved/maintained: Monitor daily weight  Goal: Free from infection  Outcome: Progressing  Flowsheets (Taken 04/25/2021 0939)  Free from infection: Monitor/assess for signs and symptoms of infection     Problem: Patient Receiving Advanced Renal Therapies  Goal: Therapy access site remains intact  Outcome: Progressing  Flowsheets (Taken 04/25/2021 0939)  Therapy access site remains intact: Assess therapy access site

## 2021-04-26 LAB — WHOLE BLOOD GLUCOSE POCT
Whole Blood Glucose POCT: 256 mg/dL — ABNORMAL HIGH (ref 70–100)
Whole Blood Glucose POCT: 247 mg/dL — ABNORMAL HIGH (ref 70–100)
Whole Blood Glucose POCT: 447 mg/dL — ABNORMAL HIGH (ref 70–100)

## 2021-04-26 LAB — GLUCOSE WHOLE BLOOD - POCT: Whole Blood Glucose POCT: 246 mg/dL — ABNORMAL HIGH (ref 70–100)

## 2021-04-26 MED ORDER — SODIUM CHLORIDE 0.9 % IV BOLUS
100.0000 mL | INTRAVENOUS | Status: AC | PRN
Start: 2021-04-26 — End: 2021-04-26

## 2021-04-26 MED ORDER — SODIUM CHLORIDE 0.9 % IV BOLUS
250.0000 mL | INTRAVENOUS | Status: AC | PRN
Start: 2021-04-26 — End: 2021-04-26

## 2021-04-26 MED ORDER — HYDRALAZINE HCL 20 MG/ML IJ SOLN
10.0000 mg | Freq: Four times a day (QID) | INTRAMUSCULAR | Status: DC | PRN
Start: 2021-04-26 — End: 2021-05-06
  Administered 2021-04-27 – 2021-04-29 (×2): 10 mg via INTRAVENOUS
  Filled 2021-04-26 (×2): qty 1

## 2021-04-26 MED ORDER — INSULIN GLARGINE 100 UNIT/ML SC SOLN
40.0000 [IU] | Freq: Every evening | SUBCUTANEOUS | Status: DC
Start: 2021-04-26 — End: 2021-05-06
  Administered 2021-04-26 – 2021-05-05 (×10): 40 [IU] via SUBCUTANEOUS
  Filled 2021-04-26 (×10): qty 40

## 2021-04-26 MED ORDER — INSULIN LISPRO 100 UNIT/ML SOLN (WRAP)
8.0000 [IU] | Freq: Three times a day (TID) | Status: DC
Start: 2021-04-26 — End: 2021-05-06
  Administered 2021-04-26 – 2021-05-06 (×26): 8 [IU] via SUBCUTANEOUS
  Filled 2021-04-26 (×20): qty 24

## 2021-04-26 NOTE — Progress Notes (Signed)
UF only x2hrs, 3L net fluid removed, tolerated well, homeostasis achieved, no signs of bleeding, pt lethargic but easily aroused, Report given to [facility] RN.   04/26/21 1136   Treatment Summary   Time Off Machine 1120   Duration of Treatment (Hours) 2   Treatment Type 1:1   Dialyzer Clearance Moderately streaked   Fluid Volume Off (mL) 3400   Prime Volume (mL) 200   Rinseback Volume (mL) 200   Fluid Given: Normal Saline (mL) 0   Fluid Given: PRBC  0 mL   Fluid Given: Albumin (mL) 0   Fluid Given: Other (mL) 0   Total Fluid Given 400   Hemodialysis Net Fluid Removed 3000   Post Treatment Assessment   Post-Treatment Weight (Kg) -3   Patient Response to Treatment TOLERATED WELL   Additional Dialyzer Used 0   Graft/Fistula Left   No placement date or time found.   Access Type: Arteriovenous Fistula  Orientation: Left  Graft/Fistula Location: Upper Arm   Fistula/ Graft Assessment Abnormalities WDL   Needle Size 15 Gauge   Cannulation Sites held Arterial (min) 5   Cannulation Sites held Venous (min) 5   Hemostasis Achieved Yes   Vitals   Temp 98 F (36.7 C)   Heart Rate (!) 56   Resp Rate 12   BP 153/71   Assessment   Mental Status Alert;Oriented;Cooperative;Lethargic   Cardiac (WDL) WDL   Cardiac Regularity Regular   Cardiac Symptoms None   Cardiac Rhythm Normal Sinus Rhythm   Respiratory  WDL   Respiratory Pattern Regular;Labored   Bilateral Breath Sounds Diminished   Cough Spontaneous   Edema  WDL   Generalized Edema Non Pitting Edema   RLE Edema +1   LLE Edema +1   General Skin Color Appropriate for ethnicity   Skin Condition/Temp Warm;Dry   Gastrointestinal (WDL) X   Abdomen Inspection Soft;Rounded   GI Symptoms None   Mobility Bed   Pain Assessment   Charting Type Reassessment   Pain Scale Used Numeric Scale (0-10)   Numeric Pain Scale   Pain Score 6   POSS Score 2   Pain Location Rib cage   Pain Orientation Right;Anterior   Pain Descriptors Aching   Pain Frequency Increases with movement;Continuous   Effect of  Pain on Daily Activities moderate   Patient's Stated Comfort Functional Goal 0   Pain Intervention(s) Medication   Multiple Pain Sites No   Education   Person taught Patient   Knowledge basis Substantial   Topics taught Procedure;Fluid Management   Teaching Tools Explain   Reponse [place] Understanding   Bedside Nurse Communication   Name of bedside RN - post dialysis Woinshet Mamo RN

## 2021-04-26 NOTE — Progress Notes (Signed)
Initial Case Management Assessment and Discharge [facility] [place]   Patient Name: [name], [name]   Date of Birth 16-Mar-1955   Attending Physician: Barbaraann Faster, MD   Primary Care Physician: Horton Marshall, MD   Length of Stay 0   Reason for Consult / Chief Complaint IDPA/ Altered mental status        Situation   Admission DX:   1. Altered mental status        A/O Status: X 3    LACE Score: 14    Patient admitted from: ER  Admission Status: observation    Health Care Agent: Self     Background     Advanced directive:   <no information>    Code Status:   Full Code     Residence: Apartment    PCP: Horton Marshall, MD  Patient Contact:   [phone] (home)     [phone] (mobile)     Emergency contact:   Extended Emergency Contact Information  Primary Emergency Contact: okoronwo,shannon  Mobile Phone: [phone]  Relation: Daughter  Interpreter needed? No  Secondary Emergency Contact: Moore Sr  Mobile Phone: [phone]  Relation: Spouse      ADL/IADL's: Needs Assist  Previous Level of function: To be evaluated    DME: Front wheeled walker   Single point cane    Pharmacy:     [id number] - [address], [place]  [place] [address]  Phone: [phone] Fax: [phone]      Prescription Coverage: Yes    Home Health: The patient is not currently receiving home health services.    Previous SNF/AR: n/a    COVID Vaccine Status: unknown    Date First IMM given: n/a  UAI on file?: No  Transport for discharge? Mode of transportation: Sports coach - Family/Friend to drive patient  Agreeable to Home with family post-discharge:  Yes     Assessment   Face sheet verified. Pt lives with son and spouse in apt with elevators. DCP home with family. Spouse to transport at discharge.  BARRIERS TO DISCHARGE: medical clearance     Recommendation   D/C Plan A: Home with family and Home with home health    D/C Plan B:     D/C Plan C:        [facility]  [phone]

## 2021-04-26 NOTE — Plan of Care (Signed)
Patient remain stable through out shift  Pain management with percocet x1  1 large BM during shift   Fall and safety precautions maintained bed in lowest position call light and bedside table within reach.    Problem: Fluid and Electrolyte Imbalance/ Endocrine  Goal: Fluid and electrolyte balance are achieved/maintained  04/26/2021 2030 by Diane Berthold, RN  Outcome: Progressing  Flowsheets (Taken 04/26/2021 [phone])  Fluid and electrolyte balance are achieved/maintained:   Monitor intake and output every shift   Monitor/assess lab values and report abnormal values   Monitor daily weight   Assess for confusion/personality changes   Observe for seizure activity and initiate seizure precautions if indicated   Observe for cardiac arrhythmias   Assess and reassess fluid and electrolyte status   Provide adequate hydration   Monitor for muscle weakness  04/26/2021 0644 by Diane Berthold, RN  Outcome: Progressing  Flowsheets (Taken 04/26/2021 [phone])  Fluid and electrolyte balance are achieved/maintained:   Monitor intake and output every shift   Monitor/assess lab values and report abnormal values   Monitor daily weight   Assess for confusion/personality changes   Observe for seizure activity and initiate seizure precautions if indicated   Observe for cardiac arrhythmias   Assess and reassess fluid and electrolyte status   Provide adequate hydration   Monitor for muscle weakness     Problem: Diabetes: Glucose Imbalance  Goal: Blood glucose stable at established goal  Outcome: Progressing  Flowsheets (Taken 04/26/2021 0644)  Blood glucose stable at established goal:   Monitor lab values   Monitor intake and output.  Notify LIP if urine output is < 30 mL/hour.   Follow fluid restrictions/IV/PO parameters   Assess for hypoglycemia /hyperglycemia   Include patient/family in decisions related to nutrition/dietary selections   Coordinate medication administration with meals, as indicated   Ensure appropriate diet and assess tolerance    Ensure adequate hydration   Monitor/assess vital signs   Ensure appropriate consults are obtained (Nutrition, Diabetes Education, and Case Management)   Ensure patient/family has adequate teaching materials     Problem: Potential for Aspiration  Goal: Risk of aspiration will be minimized  04/26/2021 2030 by Diane Berthold, RN  Outcome: Progressing  Flowsheets (Taken 04/26/2021 [phone])  Risk of aspiration will be minimized:   Dysphagia screen: Keep patient NPO if patient fails screening   Assess and monitor ability to swallow   Place swallow precaution signage above bed   Monitor/assess for signs of aspiration (tachypnea, cough, wheezing, clearing throat, hoarseness after eating, decrease in SaO2)   Order modified texture diet as recommended by Speech Pathologist   Thicken liquids as recommended by Speech Pathologist   Head of bed up 90 degrees to eat if unable to be out of bed   Place patient up in chair to eat, if possible   Instruct patient to take small single sips of liquid   Instruct patient to take small bites   Consult/collaborate with Speech Pathologist for dysphagia   Do not use a straw   Supervise patient during oral intake  04/26/2021 0644 by Diane Berthold, RN  Outcome: Progressing  Flowsheets (Taken 04/26/2021 [phone])  Risk of aspiration will be minimized:   Dysphagia screen: Keep patient NPO if patient fails screening   Assess and monitor ability to swallow   Place swallow precaution signage above bed   Monitor/assess for signs of aspiration (tachypnea, cough, wheezing, clearing throat, hoarseness after eating, decrease in SaO2)   Order modified texture diet as recommended by  Speech Pathologist   Thicken liquids as recommended by Speech Pathologist   Head of bed up 90 degrees to eat if unable to be out of bed   Place patient up in chair to eat, if possible   Instruct patient to take small single sips of liquid   Instruct patient to take small bites   Consult/collaborate with Speech Pathologist for dysphagia    Do not use a straw   Supervise patient during oral intake     Problem: Renal Instability  Goal: Fluid and electrolyte balance are achieved/maintained  Outcome: Progressing  Flowsheets (Taken 04/26/2021 0644)  Fluid and electrolyte balance are achieved/maintained:   Monitor/assess lab values and report abnormal values   Provide adequate hydration   Monitor daily weight   Observe for seizure activity and initiate seizure precautions if indicated   Assess and reassess fluid and electrolyte status   Assess for confusion/personality changes   Monitor intake and output every shift   Observe for cardiac arrhythmias   Monitor for muscle weakness   Follow fluid restrictions/IV/PO parameters     Problem: Safety  Goal: Patient will be free from injury during hospitalization  04/26/2021 2030 by Diane Berthold, RN  Outcome: Progressing  Flowsheets (Taken 04/26/2021 [phone])  Patient will be free from injury during hospitalization:   Assess patient's risk for falls and implement fall prevention plan of care per policy   Provide and maintain safe environment   Use appropriate transfer methods   Hourly rounding   Include patient/ family/ care giver in decisions related to safety   Ensure appropriate safety devices are available at the bedside  04/26/2021 0644 by Diane Berthold, RN  Outcome: Progressing  Flowsheets (Taken 04/26/2021 [phone])  Patient will be free from injury during hospitalization:   Assess patient's risk for falls and implement fall prevention plan of care per policy   Provide and maintain safe environment   Use appropriate transfer methods   Hourly rounding   Include patient/ family/ care giver in decisions related to safety   Ensure appropriate safety devices are available at the bedside     Problem: Pain  Goal: Pain at adequate level as identified by patient  Outcome: Progressing  Flowsheets (Taken 04/26/2021 2030)  Pain at adequate level as identified by patient:   Identify patient comfort function goal   Assess for risk of  opioid induced respiratory depression, including snoring/sleep apnea. Alert healthcare team of risk factors identified.   Assess pain on admission, during daily assessment and/or before any "as needed" intervention(s)   Reassess pain within 30-60 minutes of any procedure/intervention, per Pain Assessment, Intervention, Reassessment (AIR) Cycle   Evaluate if patient comfort function goal is met   Evaluate patient's satisfaction with pain management progress   Consult/collaborate with Pain Service   Offer non-pharmacological pain management interventions   Consult/collaborate with Physical Therapy, Occupational Therapy, and/or Speech Therapy   Include patient/patient care companion in decisions related to pain management as needed     Problem: Side Effects from Pain Analgesia  Goal: Patient will experience minimal side effects of analgesic therapy  Outcome: Progressing  Flowsheets (Taken 04/26/2021 2030)  Patient will experience minimal side effects of analgesic therapy:   Monitor/assess patient's respiratory status (RR depth, effort, breath sounds)   Assess for changes in cognitive function   Prevent/manage side effects per LIP orders (i.e. nausea, vomiting, pruritus, constipation, urinary retention, etc.)   Evaluate for opioid-induced sedation with appropriate assessment tool (i.e. POSS)

## 2021-04-26 NOTE — UM Notes (Addendum)
[facility] Utilization Review   NPI #[id number], Tax ID [id number]  Please call Milas Gain MSN RN CCM @  [phone]  with any questions or concerns.  Email:  [email or web address]  Fax final authorization and requests for additional information to [phone]    CONCURRENT REVIEW FOR: 04/26/21      PATIENT NAME: [name] [name] / [birth year] / AGE: 66 y.o.      S/I: BS UNCONTROLLED; PT EVAL FOR DISPO PLANNING         V/S:  Vital Sign Min/Max (last 24 hours)    Value Min Max   Temp 97.8 F (36.6 C) 98.5 F (36.9 C)   Heart Rate 54 Abnormal  74   Resp Rate 11 Abnormal  33 Abnormal    BP: Systolic 915 056   BP: Diastolic 56 84   FiO2 -- --   SpO2 97 % 99 %       Labs -   Latest Reference Range & Units 04/26/21 16:21   Whole Blood Glucose POCT 70 - 100 mg/dL 447 (H)       Radiologic Studies -   [place] Noninvas Low Extrem Art Dopp/Press/Wavefrms (Pvr) Comp 3-4 Lvls    Result Date: 04/26/2021   Abnormal physiologic study with evidence of infrainguinal disease bilaterally. There is moderate dampening of the digital waveforms in the right foot. Some pulsatility of the waveforms is present, which suggests the patient may heal her ulceration. However, if she does not heal, she would be a candidate for arteriography and possible revascularization. These findings and plan were discussed with Dr. Maudie Mercury. Clenton Pare, MD 04/26/2021 11:09 AM          MD NOTES:    PER MEDICINE   General ROS:   Generalized weakness  Subjective:   Patient seen and examined :  Seen in hemodialysis.  Continue to complain of chest pain  Plan:      Continue Percocet, rib cage x-ray , seen by cardiology atypical chest pain  Encephalopathy resolved.  Continue vancomycin at dialysis.  Until 05/27/2021  Hyperkalemia resolved.  Followed by podiatry  Podiatry following noninvasive study reviewed  Seen by pulmonary for subcentimeter pulmonary nodule, outpatient follow-up in 6 months to 1 year suggested  Increase Lantus dose start Premeal insulin,  continue sliding scale coverage  On amlodipine Coreg, monitor blood pressure.  On Rocephin  , Off  doxycycline, Vanco with dialysis till 05/27/2021  Continue statin.  Monitor H&H and platelet count.  States she does not use CPAP at home.  Fall precaution, OT PT  DVT prophylaxis  Discharge planning when stable    PER CVIR   Plan:   Results of PVR reviewed with patient  No intervention indicated from CV IR standpoint at this time.  If patient is unable to heal the right hallux wound, right lower extremity angiogram with possible revascularization could be performed.  Continue conservative management with diabetes control, lipid-lowering agents, antihypertensives, locoregional wound care     PULM. CCM PROGRESS NOTE  Plan:      .  Subcentimeter pulmonary nodules, most likely benign in nature, follow-up CT as an outpatient  .  On hemodialysis, continue to monitor electrolytes  .  On amlodipine, follow blood pressure  .  Continue present dose of Lipitor  .  Diabetes mellitus, following blood sugar, Lantus insulin, also on sliding scale  .  Sleep apnea, on nocturnal CPAP  .  Chest pain has improved    Current Medications:    Scheduled  Meds:  Current Facility-Administered Medications   Medication Dose Route Frequency    amLODIPine  10 mg Oral Daily    atorvastatin  40 mg Oral Daily    azithromycin  500 mg Intravenous Q24H    carvedilol  25 mg Oral Q12H SCH    cefTRIAXone  1 g Intravenous Q24H    dextrose  50 g Intravenous Once    gabapentin  100 mg Oral Q8H [place]    heparin (porcine)  5,000 Units Subcutaneous Q12H [place]    insulin glargine  40 Units Subcutaneous QHS    insulin lispro  1-4 Units Subcutaneous QHS    insulin lispro  1-8 Units Subcutaneous TID AC    insulin lispro  8 Units Subcutaneous TID AC    petrolatum   Topical Q12H    senna-docusate  2 tablet Oral QHS    sevelamer  800 mg Oral BID Meals    vancomycin   Intravenous See Admin Instructions        PVV:ZSMOLMB PT EVAL

## 2021-04-26 NOTE — PT Eval Note (Signed)
[facility]  Physical Therapy Attempt Note    Patient:  [name] MRN#:  [id number]  Unit:  [place] [facility] INTERMEDIATE CARE Room/Bed:  H3887/J9597-I      Physical Therapy evaluation attempted on 04/26/2021 at 10:54 AM unable to complete secondary to patient off the floor for HD.  Physical and/or occupational therapy will follow up later today as able.     RN aware.    Lenord Fellers, PT, DPT  Physical Therapist  Physical Medicine & Rehabilitation  [phone]  Mon-Fri 6:30-3pm  04/26/2021 10:54 AM

## 2021-04-26 NOTE — Progress Notes (Signed)
PROGRESS NOTE    Date Time: 04/26/21 11:34 AM  Patient Name: [name], [name]  Patient status: [facility] Hospital Day: 0      Assessment:   Chest pain reproducible  Acute encephalopathy , status post RRT , found with hypoxia, head CT negative.  Status post mechanical fall , slipped in bathroom injury to lower lip, chin.  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  Hyperkalemia.  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  Continue vancomycin at dialysis.  Until 05/27/2021  Hyperkalemia resolved.  Followed by podiatry  Podiatry following noninvasive study reviewed  Seen by pulmonary for subcentimeter pulmonary nodule, outpatient follow-up in 6 months to 1 year suggested  Increase Lantus dose start Premeal insulin, continue sliding scale coverage  On amlodipine Coreg, monitor blood pressure.  On Rocephin  , Off  doxycycline, Vanco with dialysis till 05/27/2021  Continue statin.  Monitor H&H and platelet count.  States she does not use CPAP at home.  Fall precaution, OT PT  DVT prophylaxis  Discharge planning when stable  Labs and data reviewed.  Discussed with patient and nurse  Discussed with dialysis nurse    Subjective:   Patient seen and examined :  Seen in hemodialysis.  Continue to complain of chest pain    Review of Systems:   General ROS:   No fever or chills.  Complain of chest pain, no palpitation  No shortness of breath, cough,  wheezing.  Denies nausea vomiting abdominal pain.  Generalized weakness  Medications:     Current Facility-Administered Medications   Medication Dose Route Frequency    amLODIPine  10 mg Oral Daily    atorvastatin  40 mg Oral Daily    azithromycin  500 mg Intravenous Q24H    carvedilol  25 mg Oral Q12H SCH    cefTRIAXone  1 g Intravenous Q24H    dextrose  50 g Intravenous Once    gabapentin  100 mg Oral Q8H [place]    heparin (porcine)  5,000 Units Subcutaneous Q12H [place]    insulin glargine  40 Units Subcutaneous QHS    insulin lispro  1-4 Units Subcutaneous QHS    insulin lispro  1-8 Units Subcutaneous TID AC    insulin lispro  8 Units Subcutaneous TID AC    petrolatum   Topical Q12H    senna-docusate  2 tablet Oral QHS    sevelamer  800 mg Oral BID Meals    vancomycin   Intravenous See Admin Instructions       acetaminophen **OR** acetaminophen, albuterol-ipratropium, dextrose **OR** dextrose **OR** dextrose **OR** glucagon (rDNA), hydrALAZINE, lactulose, magnesium sulfate, melatonin, naloxone, ondansetron **OR** ondansetron, oxyCODONE-acetaminophen, polyethylene glycol, sodium chloride, sodium chloride    Physical Exam:   BP 143/66   Pulse (!) 57   Temp 97.8 F (36.6 C)   Resp 12   Ht 1.575 m (5\' 2" )   Wt 109.9 kg (242  lb 4.6 oz)   LMP  (LMP Unknown) Comment: post menopausal  SpO2 99%   BMI 44.31 kg/m     Intake and Output Summary (Last 24 hours) at Date Time    Intake/Output Summary (Last 24 hours) at 04/26/2021 1134  Last data filed at 04/26/2021 0924  Gross per 24 hour   Intake 118 ml   Output --   Net 118 ml       General: Sleeping easily awake able in dialysis room.  Obese : BMI 44  Neck: Supple no JVD  ENT: PERRLA , EOMI   Lungs: Clear to auscultation no crackles or wheezing  Heart: Regular rate and rhythm no gallop  Abdomen: Soft nontender bowel sounds positive  Extremities: Right great toenail removed.  Bilateral lower extremity edema  Neuro: Grossly intact    Labs:     CBC w/Diff CMP   Recent Labs   Lab  04/24/21  0053 04/24/21  0002 04/23/21  1352   WBC 6.15 6.44 6.41   Hgb 11.3* 10.9* 11.1*   Hematocrit 37.4 35.6 36.2   Platelets 98* 94* 103*   MCV 94.7 94.2 94.0   Neutrophils 79.6 75.3 70.5       PT/INR         Recent Labs   Lab 04/25/21  2246 04/24/21  2119 04/24/21  1223 04/24/21  0828 04/24/21  0053 04/23/21  1352   Sodium  --  136 137 135 135 136   Potassium  --  5.4* 6.3* 6.6* 5.8* 5.8*   Chloride  --  98* 101 99 100 99   CO2  --  22 23 21 23 23    BUN  --  46.0* 65.0* 64.0* 60.0* 57.0*   Creatinine  --  6.5* 8.5* 8.4* 8.2* 7.6*   Glucose 381* 468* 381* 367* 327* 320*   Calcium  --  8.6 8.4* 8.3* 8.4* 8.4*   Protein, Total  --   --   --   --  6.4 6.5   Albumin  --   --   --   --  2.6* 2.7*   AST (SGOT)  --   --   --   --  24 20   ALT  --   --   --   --  32 31   Alkaline Phosphatase  --   --   --   --  297* 279*   Bilirubin, Total  --   --   --   --  0.3 0.6      Glucose POCT   Recent Labs   Lab 04/25/21  2246 04/24/21  2119 04/24/21  1223 04/24/21  0828 04/24/21  0053 04/23/21  1352   Glucose 381* 468* 381* 367* 327* 320*        Recent Labs   Lab 04/25/21  2246 04/25/21  1820 04/25/21  0356   hs Troponin-I 72.2* 59.2* 49.8*       Rads:   [place] Noninvas Low Extrem Art Dopp/Press/Wavefrms (Pvr) Comp 3-4 Lvls    Result Date: 04/26/2021   Abnormal physiologic study with evidence of infrainguinal disease bilaterally. There is moderate dampening of the digital waveforms in the right foot. Some pulsatility of the waveforms is present, which suggests the patient may heal her ulceration. However, if she does not heal, she would be a candidate for arteriography and possible revascularization. These findings and plan were discussed with Dr. Maudie Mercury. Clenton Pare, MD 04/26/2021 11:09 AM  US Venous Low Extrem Duplex Dopp Ltd Bilat    Result Date: 04/25/2021   NORMAL VENOUS DUPLEX OF THE LOWER EXTREMITIES. NO EVIDENCE OF INTRALUMINAL THROMBUS OR OBSTRUCTION TO VENOUS FLOW IN RIGHT OR LEFT LOWER EXTREMITY. Nelma Rothman, MD 04/25/2021  1:24 PM       Tashay Bozich Felipe Drone, MD  04/26/2021    *This note was generated by the Epic EMR system/ Dragon speech recognition and may contain inherent errors or omissions not intended by the user. Grammatical errors, random word insertions, deletions, pronoun errors and incomplete sentences are occasional consequences of this technology due to software limitations. Not all errors are caught or corrected. If there are questions or concerns about the content of this note or information contained within the body of this dictation they should be addressed directly with the author for clarification

## 2021-04-26 NOTE — Consults (Signed)
[facility]  CV[facility]     Consult Note     Date Time: 04/26/21 1:10 PM  Patient Name: [name]  Attending Physician: Barbaraann Faster, MD  Consulting Physician: Missy Sabins, PA,  Dr. Burt Knack    Assessment:   66 year old female with ESRD, DM admitted s/p fall with PAD and right great toe dry gangrene  No acute limb ischemia on exam. 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    Plan:   Results of PVR reviewed with patient  No intervention indicated from CV IR standpoint at this time.  If patient is unable to heal the right hallux wound, right lower extremity angiogram with possible revascularization could be performed.  Continue conservative management with diabetes control, lipid-lowering agents, antihypertensives, locoregional wound care   CVIR will sign off. Please call PRN    Discussed with patient, nursing staff, Dr Maudie Mercury      HPI/Reason for Consultation:     CVIR Team was asked by Dr.Kim to evaluate [place] for arterial insufficiency in setting of right great hallux wound.She is a 66 y.o. female with a PMH of COPD, ESRD on HD, DM, HTN, HLD, admitted on 04/23/2021 after sustaining a fall.  While hospitalized, patient had syncope and has been evaluated by cardiology and pulmonary.  Podiatry has been consulted for right hallux dry eschar and loose toenail.  Toenail has been avulsed and noninvasive arterial study was ordered to evaluate for peripheral vascular disease.  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    Patient seen on dialysis.  Sleeping.  When awakened, complains of unchanged chest pain and dyspnea since admission.  She states that she has had this right grade 2 wound for months which has unchanged in appearance.  Complains of leg pain with ambulation, left greater than right.   Ambulates short distances with a walker.  She primarily is wheelchair-bound and only leaves her house to go to dialysis.  Denies any lower extremity rest pain.    Past Medical History:     Past Medical History:   Diagnosis Date    Chronic obstructive pulmonary disease     Diabetes mellitus     Hyperlipidemia     Hypertension     Sleep apnea 2018       Past Surgical History:     Past Surgical History:   Procedure Laterality Date    EGD, BIOPSY N/A 05/14/2019    Procedure: EGD, BIOPSY;  Surgeon: Ronie Spies, MD;  Location: [facility];  Service: Gastroenterology;  Laterality: N/A;    EXPLORATORY LAPAROTOMY N/A 05/27/2019    Procedure: EXPLORATORY LAPAROTOMY;  Surgeon: Herbert Moors., MD;  Location: [facility];  Service: General;  Laterality: N/A;    JOINT REPLACEMENT      shoulder    JOINT REPLACEMENT      knee    OVARY SURGERY Left     Removal    REMOVAL, FOREIGN BODY N/A 05/27/2019    Procedure: REMOVAL, PERITONEAL DIALYSIS CATHETER;  Surgeon: Herbert Moors., MD;  Location: [facility];  Service: General;  Laterality: N/A;    TUNNELED CATH CHECK/CHANGE ([facility]ATH) N/A 07/03/2019    Procedure: TUNNELED CATH CHECK/CHANGE;  Surgeon: Maureen Ralphs, MD;  Location: [facility];  Service: Interventional Radiology;  Laterality: N/A;    TUNNELED CATH PLACEMENT ([facility]ATH)  Bilateral 05/23/2019    Procedure: TUNNELED CATH PLACEMENT;  Surgeon: Jacques Earthly, MD;  Location: [facility];  Service: Interventional Radiology;  Laterality: Bilateral;    TUNNELED CATH PLACEMENT ([facility]ATH) Right 07/31/2019    Procedure: TUNNELED CATH PLACEMENT;  Surgeon: Jacques Earthly, MD;  Location: [facility];  Service: Interventional Radiology;  Laterality: Right;       Allergies:     Allergies   Allergen Reactions    Lisinopril Hives    Metformin Hives       Medications:      Scheduled Meds: PRN Meds:    amLODIPine, 10 mg, Oral, Daily  atorvastatin, 40 mg, Oral, Daily  azithromycin, 500 mg, Intravenous, Q24H  carvedilol,  25 mg, Oral, Q12H SCH  cefTRIAXone, 1 g, Intravenous, Q24H  dextrose, 50 g, Intravenous, Once  gabapentin, 100 mg, Oral, Q8H SCH  heparin (porcine), 5,000 Units, Subcutaneous, Q12H SCH  insulin glargine, 40 Units, Subcutaneous, QHS  insulin lispro, 1-4 Units, Subcutaneous, QHS  insulin lispro, 1-8 Units, Subcutaneous, TID AC  insulin lispro, 8 Units, Subcutaneous, TID AC  petrolatum, , Topical, Q12H  senna-docusate, 2 tablet, Oral, QHS  sevelamer, 800 mg, Oral, BID Meals  vancomycin, , Intravenous, See Admin Instructions        Continuous Infusions:   acetaminophen, 650 mg, Q6H PRN   Or  acetaminophen, 650 mg, Q6H PRN  albuterol-ipratropium, 3 mL, Q6H PRN  dextrose, 15 g of glucose, PRN   Or  dextrose, 12.5 g, PRN   Or  dextrose, 12.5 g, PRN   Or  glucagon (rDNA), 1 mg, PRN  hydrALAZINE, 10 mg, Q6H PRN  lactulose, 10 g, Daily PRN  magnesium sulfate, 1 g, PRN  melatonin, 3 mg, QHS PRN  naloxone, 0.2 mg, PRN  ondansetron, 4 mg, Q6H PRN   Or  ondansetron, 4 mg, Q6H PRN  oxyCODONE-acetaminophen, 1 tablet, Q4H PRN  polyethylene glycol, 17 g, Daily PRN  sodium chloride, 100 mL, Q1H PRN  sodium chloride, 250 mL, PRN          Family History:     Family History   Problem Relation Age of Onset    Diabetes Mother     Hypertension Mother     Diabetes Father     Hypertension Father     Diabetes Sister     Hypertension Sister     Diabetes Brother     Hypertension Brother     Diabetes Paternal Aunt     Diabetes Paternal Uncle        Social History:     Social History     Socioeconomic History    Marital status: Legally Separated   Tobacco Use    Smoking status: Never    Smokeless tobacco: Never   Vaping Use    Vaping status: Never Used   Substance and Sexual Activity    Alcohol use: Never    Drug use: Never    Sexual activity: Not Currently       Review of Systems:   History obtained from chart review and the patient    Pertinent items are noted in HPI.    Physical Exam:     Vitals:    04/26/21 1233   BP: 119/56   Pulse: 63    Resp: (!) 33   Temp:    SpO2: 99%     Temp (24hrs), Avg:98 F (36.7 C), Min:97.8 F (36.6 C), Max:98.5 F (36.9 C)      APPEARANCE alert, no  distress  HEART RRR with normal S1 and S2 ,no murmurs, no gallops  LUNG diminished breath sounds diffusely  ABDOMEN obese.  Soft nontender.  EXTREMITIES bilateral lower extremities are warm with +1 pitting edema.  Station and proprioception intact.  Dopplerable bilateral DPs.  Right great hallux with eschar.  Status post right great toe avulsion.  Left great toe with mild erythema.  No wounds  NEURO Awake, alert and oriented x 3  SKIN no rashes, no lesions                  Labs:     Results       Procedure Component Value Units Date/Time    Glucose Whole Blood - POCT [[id number]]  (Abnormal) Collected: 04/26/21 1212     Updated: 04/26/21 1219     Whole Blood Glucose POCT 247 mg/dL     Glucose Whole Blood - POCT [[id number]]  (Abnormal) Collected: 04/26/21 0727     Updated: 04/26/21 0733     Whole Blood Glucose POCT 256 mg/dL     High Sensitivity Troponin-I (Every 6 hours) [[id number]]  (Abnormal) Collected: 04/25/21 2246    Specimen: Blood Updated: 04/25/21 2343     hs Troponin-I 72.2 ng/L     Glucose Whole Blood - POCT [[id number]]  (Abnormal) Collected: 04/25/21 2315     Updated: 04/25/21 2320     Whole Blood Glucose POCT 362 mg/dL     Glucose, random [[id number]]  (Abnormal) Collected: 04/25/21 2246    Specimen: Blood Updated: 04/25/21 2305     Glucose 381 mg/dL     Narrative:      1.5 Hours post Insulin administration    Glucose Whole Blood - POCT [[id number]]  (Abnormal) Collected: 04/25/21 2052     Updated: 04/25/21 2054     Whole Blood Glucose POCT 525 mg/dL     High Sensitivity Troponin-I (Every 6 hours) [[id number]]  (Abnormal) Collected: 04/25/21 1820    Specimen: Blood Updated: 04/25/21 1904     hs Troponin-I 59.2 ng/L     Glucose Whole Blood - POCT [[id number]]  (Abnormal) Collected: 04/25/21 1802     Updated: 04/25/21 1804     Whole Blood Glucose POCT 357 mg/dL              Rads:     Radiology Results (24 Hour)       Procedure Component Value Units Date/Time    [place] Noninvas Low Extrem Art Dopp/Press/Wavefrms (Pvr) Comp 3-4 Lvls [[id number]] Collected: 04/26/21 0919    Order Status: Completed Updated: 04/26/21 1112    Narrative:      BILATERAL LOWER EXTREMITY NONINVASIVE PHYSIOLOGIC ARTERIAL EXAM    HISTORY: Patient is a 66 year old female with end-stage renal disease on  dialysis with diabetes mellitus and new wound of the right hallux.    PROCEDURE: Bilateral pulse wave Doppler examination, plethysmographic  examination, and segmental arterial pressure measurements with ABI and  TBI measurements (when possible and indicated) was performed. Digital  photoplethysmography was performed bilaterally.    FINDINGS:    Right brachial pressure: 181 mm Hg  Left brachial pressure: Not obtained  Right ABI: Noncompressible   Left ABI: 0.87, likely falsely elevated secondary to poor  compressibility     Right side toe pressure: 72 mm Hg  Right TBI: 0.40   Left side toe pressure: 80 mm Hg  Left TBI: 0.44     Right side Doppler  CFA waveform: Triphasic  SFA waveform: Monophasic  Popliteal waveform: Monophasic  PT waveform: Monophasic  DP waveform: Biphasic    Left side Doppler  CFA waveform: Triphasic  SFA waveform: Monophasic  Popliteal waveform: Triphasic  PT waveform: Monophasic  DP waveform: Triphasic    Right side PVR  High thigh: Mild dampening  Low thigh: Mild dampening  Calf: Normal amplitude and morphology  Ankle: Mild dampening  Metatarsal: Normal amplitude and morphology    Left side PVR  High thigh: Mild density  Low thigh: Mild dampening  Calf: Mild dampening  Ankle: Mild dampening  Metatarsal: Normal amplitude and morphology    Right side digital waveforms: Moderate dampening  Left side digital waveforms: Mild dampening      Impression:       Abnormal physiologic study with evidence of infrainguinal  disease bilaterally. There is moderate dampening of the digital  waveforms in the  right foot. Some pulsatility of the waveforms is  present, which suggests the patient may heal her ulceration. However, if  she does not heal, she would be a candidate for arteriography and  possible revascularization. These findings and plan were discussed with  Dr. Maudie Mercury.        Clenton Pare, MD  04/26/2021 11:09 AM    US Venous Low Extrem Duplex Dopp Ltd Bilat [[id number]] Collected: 04/25/21 1324    Order Status: Completed Updated: 04/25/21 1326    Narrative:      CLINICAL INDICATIONS: 66 year old female with bilateral lower extremity  pain and swelling    TECHNIQUE: Duplex evaluation of the veins of the lower extremities is  performed from the lower pelvis to the upper calves bilaterally with  gray  scale imaging, transverse compression and gated and color Doppler  techniques. Calf vein evaluation is performed as well with gray scale  and augmentation techniques.     INTERPRETATION: Examination of the venous system of the right and left  lower extremities demonstrates no evidence of intraluminal thrombus or  obstruction to venous flow. Normal phasicity is present at the  iliofemoral junctions indicating no central obstruction. There is normal  coaptation of the femoropopliteal veins throughout their course with  transverse compression. The saphenofemoral junction is also noted to be  widely patent bilaterally. Augmented flow is present in the calf veins  bilaterally.      Impression:       NORMAL VENOUS DUPLEX OF THE LOWER EXTREMITIES. NO EVIDENCE  OF INTRALUMINAL THROMBUS OR OBSTRUCTION TO VENOUS FLOW IN RIGHT OR LEFT  LOWER EXTREMITY.    Nelma Rothman, MD  04/25/2021 1:24 PM            Signed by: Missy Sabins, PA  CVIR Department  [phone]      I have evaluated the patient and agree with the above documented evaluation and plan.    Signed by: Sherri Rad, [place] [facility]  [phone]

## 2021-04-26 NOTE — Nursing Progress Note (Signed)
Left the uit for dialysis treatment

## 2021-04-26 NOTE — Progress Notes (Signed)
PULM. CCM PROGRESS NOTE    Date Time: 04/26/21 2:30 PM  Patient Name: [name],[name]      Assessment:     .  Subcentimeter pulmonary nodules  .  End-stage renal disease  .  Hypertension  .  Hyperlipidemia  .  Diabetes mellitus  .  Sleep apnea  .  Atypical chest pain    Plan:     .  Subcentimeter pulmonary nodules, most likely benign in nature, follow-up CT as an outpatient  .  On hemodialysis, continue to monitor electrolytes  .  On amlodipine, follow blood pressure  .  Continue present dose of Lipitor  .  Diabetes mellitus, following blood sugar, Lantus insulin, also on sliding scale  .  Sleep apnea, on nocturnal CPAP  .  Chest pain has improved    Discussed with patient    Subjective:   Patient is a 66 y.o. female who is awake, alert, and comfortable.     Medications:     Current Facility-Administered Medications   Medication Dose Route Frequency    amLODIPine  10 mg Oral Daily    atorvastatin  40 mg Oral Daily    azithromycin  500 mg Intravenous Q24H    carvedilol  25 mg Oral Q12H SCH    cefTRIAXone  1 g Intravenous Q24H    dextrose  50 g Intravenous Once    gabapentin  100 mg Oral Q8H [place]    heparin (porcine)  5,000 Units Subcutaneous Q12H [place]    insulin glargine  40 Units Subcutaneous QHS    insulin lispro  1-4 Units Subcutaneous QHS    insulin lispro  1-8 Units Subcutaneous TID AC    insulin lispro  8 Units Subcutaneous TID AC    petrolatum   Topical Q12H    senna-docusate  2 tablet Oral QHS    sevelamer  800 mg Oral BID Meals    vancomycin   Intravenous See Admin Instructions       Review of Systems:     General ROS: negative for - chills, fever or night sweats  Ophthalmic ROS: negative for - double vision, excessive tearing, itchy eyes or photophobia  ENT ROS: negative for - headaches, nasal congestion, sore throat or vertigo  Respiratory ROS: negative for - cough, hemoptysis, orthopnea, pleuritic pain, shortness of breath, tachypnea or wheezing  Cardiovascular ROS: negative for - chest pain, edema,  orthopnea, rapid heart rate  Gastrointestinal ROS: negative for - abdominal pain, blood in stools, constipation, diarrhea, heartburn or nausea/vomiting  Musculoskeletal ROS: negative for - muscle pain  Neurological ROS: negative for - confusion, dizziness, headaches, speech problems, visual changes or weakness  Dermatological ROS: negative for dry skin, pruritus and rash    Physical Exam:   BP 119/56   Pulse 63   Temp 98.5 F (36.9 C) (Oral)   Resp (!) 33   Ht 1.575 m (5\' 2" )   Wt 109.9 kg (242 lb 4.6 oz)   LMP  (LMP Unknown) Comment: post menopausal  SpO2 99%   BMI 44.31 kg/m     Intake and Output Summary (Last 24 hours) at Date Time    Intake/Output Summary (Last 24 hours) at 04/26/2021 1430  Last data filed at 04/26/2021 1136  Gross per 24 hour   Intake 118 ml   Output 3000 ml   Net -2882 ml       General appearance - alert,  and in no distress  Mental status - alert, oriented to person,  place, and time  Eyes - pupils equal and reactive, extraocular eye movements intact  Ears - no external ear lesions seen  Nose - no nasal discharge   Mouth - clear oral mucosa, moist   Neck - supple, no significant adenopathy  Chest - clear to auscultation, no wheezes, rales or rhonchi, symmetric air entry  Heart - normal rate, regular rhythm, normal S1, S2, no rubs, clicks or gallops  Abdomen - soft, nontender, nondistended, no masses or organomegaly  Neurological - alert, oriented, normal speech, no focal findings or movement disorder noted  Musculoskeletal - no joint swelling or tenderness  Extremities -plus  pedal edema, no clubbing or cyanosis  Skin - normal coloration, no rashes    Labs:     Recent Labs   Lab 04/24/21  0053 04/24/21  0002 04/23/21  1352   WBC 6.15 6.44 6.41   Hgb 11.3* 10.9* 11.1*   Hematocrit 37.4 35.6 36.2   Platelets 98* 94* 103*   MCV 94.7 94.2 94.0   Neutrophils 79.6 75.3 70.5     Recent Labs   Lab 04/25/21  2246 04/24/21  2119 04/24/21  1223 04/24/21  0828 04/24/21  0053 04/23/21  1352   Sodium   --  136 137 135 135 136   Potassium  --  5.4* 6.3* 6.6* 5.8* 5.8*   Chloride  --  98* 101 99 100 99   CO2  --  22 23 21 23 23    BUN  --  46.0* 65.0* 64.0* 60.0* 57.0*   Creatinine  --  6.5* 8.5* 8.4* 8.2* 7.6*   Glucose 381* 468* 381* 367* 327* 320*   Calcium  --  8.6 8.4* 8.3* 8.4* 8.4*   Protein, Total  --   --   --   --  6.4 6.5   Albumin  --   --   --   --  2.6* 2.7*   AST (SGOT)  --   --   --   --  24 20   ALT  --   --   --   --  32 31   Alkaline Phosphatase  --   --   --   --  297* 279*   Bilirubin, Total  --   --   --   --  0.3 0.6     Glucose:    Recent Labs   Lab 04/25/21  2246 04/24/21  2119 04/24/21  1223 04/24/21  0828 04/24/21  0053 04/23/21  1352   Glucose 381* 468* 381* 367* 327* 320*           Rads:   Radiological Procedure reviewed.  Radiology Results (24 Hour)       Procedure Component Value Units Date/Time    [place] Noninvas Low Extrem Art Dopp/Press/Wavefrms (Pvr) Comp 3-4 Lvls [[id number]] Collected: 04/26/21 0919    Order Status: Completed Updated: 04/26/21 1112    Narrative:      BILATERAL LOWER EXTREMITY NONINVASIVE PHYSIOLOGIC ARTERIAL EXAM    HISTORY: Patient is a 66 year old female with end-stage renal disease on  dialysis with diabetes mellitus and new wound of the right hallux.    PROCEDURE: Bilateral pulse wave Doppler examination, plethysmographic  examination, and segmental arterial pressure measurements with ABI and  TBI measurements (when possible and indicated) was performed. Digital  photoplethysmography was performed bilaterally.    FINDINGS:    Right brachial pressure: 181 mm Hg  Left brachial pressure: Not obtained  Right ABI: Noncompressible  Left ABI: 0.87, likely falsely elevated secondary to poor  compressibility     Right side toe pressure: 72 mm Hg  Right TBI: 0.40   Left side toe pressure: 80 mm Hg  Left TBI: 0.44     Right side Doppler  CFA waveform: Triphasic  SFA waveform: Monophasic  Popliteal waveform: Monophasic  PT waveform: Monophasic  DP waveform: Biphasic    Left  side Doppler  CFA waveform: Triphasic  SFA waveform: Monophasic  Popliteal waveform: Triphasic  PT waveform: Monophasic  DP waveform: Triphasic    Right side PVR  High thigh: Mild dampening  Low thigh: Mild dampening  Calf: Normal amplitude and morphology  Ankle: Mild dampening  Metatarsal: Normal amplitude and morphology    Left side PVR  High thigh: Mild density  Low thigh: Mild dampening  Calf: Mild dampening  Ankle: Mild dampening  Metatarsal: Normal amplitude and morphology    Right side digital waveforms: Moderate dampening  Left side digital waveforms: Mild dampening      Impression:       Abnormal physiologic study with evidence of infrainguinal  disease bilaterally. There is moderate dampening of the digital  waveforms in the right foot. Some pulsatility of the waveforms is  present, which suggests the patient may heal her ulceration. However, if  she does not heal, she would be a candidate for arteriography and  possible revascularization. These findings and plan were discussed with  Dr. Maudie Mercury.        Clenton Pare, MD  04/26/2021 11:09 AM              Inda Castle, MD  Pulmonary and critical care  04/26/21

## 2021-04-26 NOTE — Plan of Care (Signed)
Problem: Renal Instability  Goal: Fluid and electrolyte balance are achieved/maintained  Flowsheets (Taken 04/26/2021 0644 by Alda Berthold, RN)  Fluid and electrolyte balance are achieved/maintained:   Monitor/assess lab values and report abnormal values   Provide adequate hydration   Monitor daily weight   Observe for seizure activity and initiate seizure precautions if indicated   Assess and reassess fluid and electrolyte status   Assess for confusion/personality changes   Monitor intake and output every shift   Observe for cardiac arrhythmias   Monitor for muscle weakness   Follow fluid restrictions/IV/PO parameters  Goal: Free from infection  Flowsheets (Taken 04/25/2021 0939 by Ferman Hamming, RN)  Free from infection: Monitor/assess for signs and symptoms of infection     Problem: Patient Receiving Advanced Renal Therapies  Goal: Therapy access site remains intact  Flowsheets (Taken 04/25/2021 0939 by Ferman Hamming, RN)  Therapy access site remains intact: Assess therapy access site

## 2021-04-26 NOTE — Plan of Care (Signed)
Pt denies SOB and pain.  Neuro: Pt AAOx 4.   CV: NS  Respiratory: 3L start of shift, Cpap HS.  GI: consistent carb diet, no bm during shift.  GU: External cath in place.   Skin Integrity:  Wound care completed.  Labs/Critical Values: Blood glucose 523, MD notified and aware. See new orders.  Fall and safety precautions maintained bed in lowest position call light and bedside table within reach will continue to monitor.    Problem: Fluid and Electrolyte Imbalance/ Endocrine  Goal: Fluid and electrolyte balance are achieved/maintained  04/26/2021 0644 by Alda Berthold, RN  Outcome: Progressing  Flowsheets (Taken 04/26/2021 [id number])  Fluid and electrolyte balance are achieved/maintained:   Monitor intake and output every shift   Monitor/assess lab values and report abnormal values   Monitor daily weight   Assess for confusion/personality changes   Observe for seizure activity and initiate seizure precautions if indicated   Observe for cardiac arrhythmias   Assess and reassess fluid and electrolyte status   Provide adequate hydration   Monitor for muscle weakness  04/26/2021 0016 by Alda Berthold, RN  Outcome: Progressing     Problem: Diabetes: Glucose Imbalance  Goal: Blood glucose stable at established goal  Outcome: Progressing  Flowsheets (Taken 04/26/2021 0644)  Blood glucose stable at established goal:   Monitor lab values   Monitor intake and output.  Notify LIP if urine output is < 30 mL/hour.   Follow fluid restrictions/IV/PO parameters   Assess for hypoglycemia /hyperglycemia   Include patient/family in decisions related to nutrition/dietary selections   Coordinate medication administration with meals, as indicated   Ensure appropriate diet and assess tolerance   Ensure adequate hydration   Monitor/assess vital signs   Ensure appropriate consults are obtained (Nutrition, Diabetes Education, and Case Management)   Ensure patient/family has adequate teaching materials     Problem: Potential for Aspiration  Goal: Risk  of aspiration will be minimized  Outcome: Progressing  Flowsheets (Taken 04/26/2021 0644)  Risk of aspiration will be minimized:   Dysphagia screen: Keep patient NPO if patient fails screening   Assess and monitor ability to swallow   Place swallow precaution signage above bed   Monitor/assess for signs of aspiration (tachypnea, cough, wheezing, clearing throat, hoarseness after eating, decrease in SaO2)   Order modified texture diet as recommended by Speech Pathologist   Thicken liquids as recommended by Speech Pathologist   Head of bed up 90 degrees to eat if unable to be out of bed   Place patient up in chair to eat, if possible   Instruct patient to take small single sips of liquid   Instruct patient to take small bites   Consult/collaborate with Speech Pathologist for dysphagia   Do not use a straw   Supervise patient during oral intake     Problem: Renal Instability  Goal: Fluid and electrolyte balance are achieved/maintained  Outcome: Progressing  Flowsheets (Taken 04/26/2021 0644)  Fluid and electrolyte balance are achieved/maintained:   Monitor/assess lab values and report abnormal values   Provide adequate hydration   Monitor daily weight   Observe for seizure activity and initiate seizure precautions if indicated   Assess and reassess fluid and electrolyte status   Assess for confusion/personality changes   Monitor intake and output every shift   Observe for cardiac arrhythmias   Monitor for muscle weakness   Follow fluid restrictions/IV/PO parameters     Problem: Safety  Goal: Patient will be free from injury during hospitalization  Outcome: Progressing  Flowsheets (Taken 04/26/2021 [id number])  Patient will be free from injury during hospitalization:   Assess patient's risk for falls and implement fall prevention plan of care per policy   Provide and maintain safe environment   Use appropriate transfer methods   Hourly rounding   Include patient/ family/ care giver in decisions related to safety   Ensure  appropriate safety devices are available at the bedside   Bed in lowest position, floor mats present, 3/4 side rails up, repositioned q2hrs, bedside table and call bell within reach throughout shift. Bedside handoff with night shift RN completed.

## 2021-04-26 NOTE — Nursing Progress Note (Addendum)
B/P 164/72, scheduled B/P meds given, Attending Made aware. Received order for PRN hydralazine. Pain management in progress. Dialysis done during the shift. Safety and comfort maintained  1900 report given to night shift.

## 2021-04-26 NOTE — Progress Notes (Signed)
[facility] PROGRESS NOTE  [id number]      Date Time: 04/26/21 4:45 PM  Patient Name: [name]  Attending Physician: Barbaraann Faster, MD    CC: follow-up ESRD     Assessment:     ESRD on HD MWF at [facility] - followed by Dr. Randel Pigg  Hyperkalemia due to missed HD & hyperglycemia- last HD on 3/29, 4/2  Mild hypocalcemia due to CKD  Anemia in CKD, hgb at goal  HTN - controlled  DM type 2 with hyperglycemia  Hyperphosphatemia: PTH 1579 on 3/23  Secondary hyperparathyroidism  Fall   Abdominal wall abscess MRSA on vancomycin     Recommendations:     HD routine per MWF schedule   Assess daily for extra UF needs  Vanc per level     Case discussed with: RN, pt      Nameer Summer Oretha Ellis, MD  [facility]  [id number] (office)      Subjective:     UF today 3L removed.  C/o of chest pain (severe) TTP  Pt was resting.     Review of Systems:     No cp  No sob  R Toe pain      Physical Exam:     Vitals:    04/26/21 1130 04/26/21 1136 04/26/21 1215 04/26/21 1233   BP: 153/71 153/71 167/72 119/56   Pulse: (!) 56 (!) 56 60 63   Resp: 12 12 22  (!) 33   Temp: 98 F (36.7 C) 98 F (36.7 C) 98.5 F (36.9 C)    TempSrc:   Oral    SpO2:    99%   Weight:       Height:           Intake and Output Summary (Last 24 hours) at Date Time    Intake/Output Summary (Last 24 hours) at 04/26/2021 1645  Last data filed at 04/26/2021 1136  Gross per 24 hour   Intake 118 ml   Output 3000 ml   Net -2882 ml         General: awake, alert, oriented x 3, no acute distress  Cardiovascular: regular rate and rhythm, no murmurs, rubs or gallops  Lungs: clear to auscultation bilaterally, without wheezing, rhonchi, or rales  Abdomen: soft, non-tender, non-distended, normoactive bowel sounds  Extremities: no clubbing, cyanosis, or edema      Meds:      Scheduled Meds: PRN Meds:    amLODIPine, 10 mg, Oral, Daily  atorvastatin, 40 mg, Oral, Daily  azithromycin, 500 mg, Intravenous, Q24H  carvedilol, 25 mg, Oral, Q12H  SCH  cefTRIAXone, 1 g, Intravenous, Q24H  dextrose, 50 g, Intravenous, Once  gabapentin, 100 mg, Oral, Q8H SCH  heparin (porcine), 5,000 Units, Subcutaneous, Q12H SCH  insulin glargine, 40 Units, Subcutaneous, QHS  insulin lispro, 1-4 Units, Subcutaneous, QHS  insulin lispro, 1-8 Units, Subcutaneous, TID AC  insulin lispro, 8 Units, Subcutaneous, TID AC  petrolatum, , Topical, Q12H  senna-docusate, 2 tablet, Oral, QHS  sevelamer, 800 mg, Oral, BID Meals  vancomycin, , Intravenous, See Admin Instructions          Continuous Infusions:   acetaminophen, 650 mg, Q6H PRN   Or  acetaminophen, 650 mg, Q6H PRN  albuterol-ipratropium, 3 mL, Q6H PRN  dextrose, 15 g of glucose, PRN   Or  dextrose, 12.5 g, PRN   Or  dextrose, 12.5 g, PRN   Or  glucagon (rDNA), 1 mg, PRN  hydrALAZINE, 10 mg,  Q6H PRN  lactulose, 10 g, Daily PRN  magnesium sulfate, 1 g, PRN  melatonin, 3 mg, QHS PRN  naloxone, 0.2 mg, PRN  ondansetron, 4 mg, Q6H PRN   Or  ondansetron, 4 mg, Q6H PRN  oxyCODONE-acetaminophen, 1 tablet, Q4H PRN  polyethylene glycol, 17 g, Daily PRN              Labs:     Recent Labs   Lab 04/24/21  0053 04/24/21  0002 04/23/21  1352   WBC 6.15 6.44 6.41   Hgb 11.3* 10.9* 11.1*   Hematocrit 37.4 35.6 36.2   Platelets 98* 94* 103*       Recent Labs   Lab 04/25/21  2246 04/24/21  2119 04/24/21  1223 04/24/21  0828 04/24/21  0053 04/23/21  1352   Sodium  --  136 137 135 135 136   Potassium  --  5.4* 6.3* 6.6* 5.8* 5.8*   Chloride  --  98* 101 99 100 99   CO2  --  22 23 21 23 23    BUN  --  46.0* 65.0* 64.0* 60.0* 57.0*   Creatinine  --  6.5* 8.5* 8.4* 8.2* 7.6*   Calcium  --  8.6 8.4* 8.3* 8.4* 8.4*   Albumin  --   --   --   --  2.6* 2.7*   Glucose 381* 468* 381* 367* 327* 320*   EGFR  --  7.7 5.7 5.8 5.9 6.5               Invalid input(s): LEUKOCYTESUR        Imaging personally reviewed, including: [place] Noninvas Low Extrem Art Dopp/Press/Wavefrms (Pvr) Comp 3-4 Lvls    Result Date: 04/26/2021   Abnormal physiologic study with evidence of  infrainguinal disease bilaterally. There is moderate dampening of the digital waveforms in the right foot. Some pulsatility of the waveforms is present, which suggests the patient may heal her ulceration. However, if she does not heal, she would be a candidate for arteriography and possible revascularization. These findings and plan were discussed with Dr. Maudie Mercury. Clenton Pare, MD 04/26/2021 11:09 AM         Signed by: Renard Hamper, MD

## 2021-04-26 NOTE — OT Eval Note (Signed)
Occupational Therapy Eval and Treatment [name]        Post Acute Care Therapy Recommendations   Discharge Recommendations:  SNF    If recommended discharge disposition is not available, patient will require the following assistance: 24 hour supervision, Assist with OOB activity, Assist with I/ADLs, and HH therapy    DME needs IF patient is discharging home: [facility]    Therapy discharge recommendations may change with patient status.  Please refer to most recent note for up-to-date recommendations.    Is PT evaluation indicated at this time? Yes, an acute care PT evaluation is indicated at this time and should be performed prior to hospital discharge to assist with DME recommendations and/or D/C placement recommendations.    Unit: [place] [facility] [facility] INTERMEDIATE CARE  Bed: H6808/U1103-P      ___________________________________________________    Time of Evaluation and Treatment:  Time Calculation  OT Received On: 04/26/21  Start Time: 1523  Stop Time: 1543  Time Calculation (min): 20 min    Chart Review and Collaboration with Care Team: 5 minutes, not included in above time.    Evaluation: 8 minutes  Treatment:  12 minutes    OT Visit Number: 1    Consult received for [name] for OT Evaluation and Treatment.  Patient's medical condition is appropriate for Occupational therapy intervention at this time.    Activity Orders:  OT eval and treat    Precautions and Contraindications:  Precautions  Weight Bearing Status: no restrictions  Fall Risks: High, Impaired balance/gait, History of fall(s)    Personal Protective Equipment (PPE)  gloves, procedure gown, and procedure mask    Medical Diagnosis:  Altered mental status [R41.82]    History of Present Illness:  [name] is a 66 y.o. female admitted on 04/23/2021 with fall out of the shower. She has a wound on her R heel; per podiatry she is WBAT    Patient Active Problem List   Diagnosis    Iron deficiency anemia secondary to inadequate dietary  iron intake    Sideropenic dysphagia    Peritoneal dialysis catheter dysfunction, initial encounter    Peritonitis    Upper GI bleed    ESRD (end stage renal disease) on dialysis    Generalized abdominal pain    Thrombocytopenia    h/o orthostatic hypotension    Type 2 diabetes mellitus, with long-term current use of insulin    GERD (gastroesophageal reflux disease)    Bowel incontinence    Right sided numbness    Uncontrolled type 2 diabetes mellitus with hyperglycemia    Hypertension    Hyperlipidemia    History of COPD    Gastritis with hemorrhage    History of anemia due to chronic kidney disease    Open wound, abdominal wall, lateral, subsequent encounter    Gout    Dizziness    Fall    History of CVA (cerebrovascular accident)    Pneumonia due to COVID-19 virus    Diarrhea    Hypertensive urgency    Shortness of breath    ESRD (end stage renal disease)    Hyperkalemia    Pulmonary edema    Hypervolemia    Elevated troponin    Chest pain    Nausea vomiting and diarrhea    Breast pain, left    Syncope    Hypertensive emergency    Volume overload    Altered mental status        Past  Medical/Surgical History:  Past Medical History:   Diagnosis Date    Chronic obstructive pulmonary disease     Diabetes mellitus     Hyperlipidemia     Hypertension     Sleep apnea 2018     Past Surgical History:   Procedure Laterality Date    EGD, BIOPSY N/A 05/14/2019    Procedure: EGD, BIOPSY;  Surgeon: Ronie Spies, MD;  Location: [facility];  Service: Gastroenterology;  Laterality: N/A;    EXPLORATORY LAPAROTOMY N/A 05/27/2019    Procedure: EXPLORATORY LAPAROTOMY;  Surgeon: Herbert Moors., MD;  Location: [facility];  Service: General;  Laterality: N/A;    JOINT REPLACEMENT      shoulder    JOINT REPLACEMENT      knee    OVARY SURGERY Left     Removal    REMOVAL, FOREIGN BODY N/A 05/27/2019    Procedure: REMOVAL, PERITONEAL DIALYSIS CATHETER;  Surgeon: Herbert Moors., MD;  Location: [facility];  Service:  General;  Laterality: N/A;    TUNNELED CATH CHECK/CHANGE (PERMCATH) N/A 07/03/2019    Procedure: TUNNELED CATH CHECK/CHANGE;  Surgeon: Maureen Ralphs, MD;  Location: [facility];  Service: Interventional Radiology;  Laterality: N/A;    TUNNELED CATH PLACEMENT (PERMCATH) Bilateral 05/23/2019    Procedure: TUNNELED CATH PLACEMENT;  Surgeon: Jacques Earthly, MD;  Location: [facility];  Service: Interventional Radiology;  Laterality: Bilateral;    TUNNELED CATH PLACEMENT (PERMCATH) Right 07/31/2019    Procedure: TUNNELED CATH PLACEMENT;  Surgeon: Jacques Earthly, MD;  Location: [facility];  Service: Interventional Radiology;  Laterality: Right;        X-Rays/Tests/Labs  Lab Results   Component Value Date/Time    HGB 11.3 (L) 04/24/2021 12:53 AM    HCT 37.4 04/24/2021 12:53 AM    K 5.4 (H) 04/24/2021 09:19 PM    NA 136 04/24/2021 09:19 PM    INR 1.2 (H) 03/04/2021 01:23 AM    TROPI 72.2 (AA) 04/25/2021 10:46 PM    TROPI 59.2 (A) 04/25/2021 06:20 PM    TROPI 49.8 (A) 04/25/2021 03:56 AM    TROPI 58.6 (A) 04/24/2021 06:34 PM    TROPI 0.01 03/18/2006 06:10 PM       All imaging reviewed. Please see chart for details      Social History:  Prior Level of Function  Prior level of function: Ambulates with assistive device, Independent with ADLs  Assistive Device: Front wheel walker  Baseline Activity Level: Household ambulation  Driving: does not drive  Dressing - Upper Body: modified independent  Dressing - Lower Body: modified independent  Cooking: No  Feeding: modified independent  Bathing: modified independent  Grooming: modified independent  Toileting: modified independent  DME Currently at Home: ADL- Civil engineer, contracting, Environmental consultant, [place]  Living Arrangements: Children  Type of Home: House  Home Layout: One level  Bathroom Shower/Tub: Administrator, Civil Service: Standard  DME Currently at Home: Roy, Environmental consultant, [place] - Notes / Comments:  (pt's son assists with IADLs  and ADLs as needed)      Subjective:      Patient is agreeable to participation in the therapy session.      Pain Assessment  Pain Assessment: Numeric Scale (0-10)  Pain Score: 6-moderate pain  Pain Location: Chest  Pain Intervention(s): Repositioned (RN notified)      Objective:      Observation of Patient/Vital  Signs:stable    Cognitive Status and Neuro Exam:  Cognition/Neuro Status  Orientation Level: Oriented X4  Behavior: anxious, cooperative    Musculoskeletal Examination  Gross ROM  Right Upper Extremity ROM: within functional limits  Left Upper Extremity ROM: needs focused assessment  Left Upper Extremity Overall ROM % reduced: reduced by 75%  Gross Strength  Right Upper Extremity Strength: 4-/5  Left Upper Extremity Strength: 4-/5         Activities of Daily Living  Self-care and Home Management  Grooming: Minimal Assist, in bed, wash/dry hands  LB Dressing: Dependent, in bed, Don/doff R sock, Don/doff L sock    Functional Mobility:  Mobility and Transfers  Rolling: Maximal Assist, to Left  Scooting to HOB: Dependent     Balance  Balance  Static Sitting Balance:  (UTA)    Participation and Activity Tolerance  Participation and Endurance  Participation Effort: fair  Endurance: Tolerates 10 - 20 min exercise with multiple rests    Educated the Patient to role of occupational therapy, plan of care, goals of therapy and safety with mobility and ADLs with verbalized understanding .    Patient left in bed with alarm and all other medical equipment in place and call bell and all personal items/needs within reach.  RN notified of session outcome. Notified CM team and attending of d/c recommendation via secure chat.      Assessment:  [name] is a 66 y.o. female admitted 04/23/2021. OT Assessment: decreased ROM;decreased strength;decreased independence with ADLs;decreased safety awareness      Treatment:  Pt sleeping on entry; upon waking, she reports chest and rib cage pain. She reports this is from her  fall; notified RN and clarified this is not acute CP. Pt appeared in distress, yelling out and stating she is in pain and with difficulty breathing. Pt was repositioned up in bed requiring max A, and bed adjusted so she was sitting more upright. In upright position, she reported less discomfort and was able to sip water and answer PLOF questions. She participated in rolling to begin trying to sit EOB, however unable to progress further due to reports of pain and discomfort in chest and rib cage. Pt appears below her baseline for ADLs and functional transfers. Recommend SNF at discharge for maximized independence with ADLs and transfers.    Rehabilitation Potential: Prognosis: Good, With continued OT s/p acute discharge    Plan:  OT Frequency Recommended: 2-3x/wk   Treatment Interventions: ADL retraining, Functional transfer training, UE strengthening/ROM, Endurance training     PMP Activity: Step 3 - Bed Mobility  Distance Walked (ft) (Step 6,7): 0 Feet    Risks/benefits/POC discussed    Goals:  Time For Goal Achievement: by time of discharge  ADL Goals  Patient will groom self: Supervision  Patient will dress upper body: Supervision  Patient will dress lower body: Minimal Assist  Patient will toilet: Minimal Assist  Mobility and Transfer Goals  Pt will transfer bed to toilet: Minimal Assist                           Albertha Ghee, OTR/L  Physical Medicine and [facility]    04/26/2021  5:15 PM    [facility]  Patient: [name] MRN#: [id number]   Unit: [place] [facility] INTERMEDIATE CARE Bed: N9892/J1941-D

## 2021-04-26 NOTE — Progress Notes (Signed)
Hd tx started, UF only today, pt lethargic, still co of pain around the ribcage, pt co of sob due to pain, VS wdl, Report received from [place].   04/26/21 0920   Bedside Nurse Communication   Name of bedside RN - pre dialysis Hortencia Conradi RN   Treatment Initiation- With Dialysis Precautions   Time Out/Safety Check Completed Yes   Consent for HD signed for this hospitalization (Date) 03/04/21   Consent for HD signed for this hospitalization (Time) 1540   Preferred language No   Blood Consent Verified N/A   Dialysis Precautions All Connections Secured   Dialysis Treatment Type Routine   Is patient diabetic? Yes   RO/Hemodialysis Architectural technologist   Is Total Chlorine less than 0.1 ppm? Yes   Orignial Total Chlorine Testing Time 0900   At 4 Hour Total Chlorine Testing Time 1300   RO/Hemodialysis Facilities manager Number 2    Machine Serial Number [id number]   RO # 26   RO Serial # [id number]   Water Hardness NA   pH 7.2   Pressure Test Verified Yes   Alarms Verified Passed   Machine Temperature 97.7 F (36.5 C)   Alarms Verified Yes   Na+ mEq (Machine) 138 mEq   Bicarb mEq (Machine) 35 mEq   Hemodialysis Conductivity (Machine) 13.6   Hemodialysis Conductivity (Meter) 13.7   Dialyzer Lot Number [id number]   Tubing Lot Number [id number]   RO Machine Log Completed Yes   Hepatitis Status   HBsAg (Antigen) Result Negative   HBsAg Date Drawn 04/13/21   HBsAg Repeat Draw Due Date 05/11/21   Dialysis Weight   Pre-Treatment Weight (Kg) 109.9   Scale Type ICU Bed Scale   Vitals   Temp 97.8 F (36.6 C)   Heart Rate 63   Resp Rate 17   BP 165/70   SpO2 99 %   Assessment   Mental Status Alert;Oriented;Cooperative;Lethargic   Cardiac (WDL) WDL   Cardiac Regularity Regular   Cardiac Symptoms None   Cardiac Rhythm Normal Sinus Rhythm   Respiratory  WDL   Respiratory Pattern Regular;Labored   Bilateral Breath Sounds Diminished   Cough Spontaneous   Edema  WDL   Generalized Edema Non Pitting Edema   RLE Edema +1   LLE  Edema +1   General Skin Color Appropriate for ethnicity   Skin Condition/Temp Warm;Dry   Gastrointestinal (WDL) X   Abdomen Inspection Soft;Rounded   GI Symptoms None   Mobility Bed   Graft/Fistula Left   No placement date or time found.   Access Type: Arteriovenous Fistula  Orientation: Left  Graft/Fistula Location: Upper Arm   Fistula/ Graft Assessment Abnormalities WDL   Needle Size 15 Gauge   Pain Assessment   Charting Type Assessment   Pain Scale Used Numeric Scale (0-10)   Numeric Pain Scale   Pain Score 4   POSS Score 2   Pain Location Rib cage   Pain Descriptors Aching   Pain Frequency Continuous;Increases with movement   Effect of Pain on Daily Activities moderate   Patient's Stated Comfort Functional Goal 0   Pain Intervention(s) Medication   Multiple Pain Sites No   Hemodialysis Comments   Pre-Hemodialysis Comments TIMEOUT & SAFETY CHECKS DONE

## 2021-04-27 ENCOUNTER — Observation Stay: Payer: 59

## 2021-04-27 LAB — BASIC METABOLIC PANEL
Anion Gap: 10 (ref 5.0–15.0)
BUN: 54 mg/dL — ABNORMAL HIGH (ref 7.0–21.0)
CO2: 24 mEq/L (ref 17–29)
Calcium: 7.5 mg/dL — ABNORMAL LOW (ref 8.5–10.5)
Chloride: 104 mEq/L (ref 99–111)
Creatinine: 5.7 mg/dL — ABNORMAL HIGH (ref 0.4–1.0)
Glucose: 189 mg/dL — ABNORMAL HIGH (ref 70–100)
Potassium: 4.3 mEq/L (ref 3.5–5.3)
Sodium: 138 mEq/L (ref 135–145)

## 2021-04-27 LAB — GLUCOSE WHOLE BLOOD - POCT
Whole Blood Glucose POCT: 222 mg/dL — ABNORMAL HIGH (ref 70–100)
Whole Blood Glucose POCT: 247 mg/dL — ABNORMAL HIGH (ref 70–100)
Whole Blood Glucose POCT: 194 mg/dL — ABNORMAL HIGH (ref 70–100)

## 2021-04-27 LAB — CBC
Absolute NRBC: 0 10*3/uL (ref 0.00–0.00)
Hematocrit: 35.5 % (ref 34.7–43.7)
Hgb: 10.7 g/dL — ABNORMAL LOW (ref 11.4–14.8)
MCH: 28.9 pg (ref 25.1–33.5)
MCHC: 30.1 g/dL — ABNORMAL LOW (ref 31.5–35.8)
MCV: 95.9 fL (ref 78.0–96.0)
MPV: 12.5 fL (ref 8.9–12.5)
Nucleated RBC: 0 /100 WBC (ref 0.0–0.0)
Platelets: 106 10*3/uL — ABNORMAL LOW (ref 142–346)
RBC: 3.7 10*6/uL — ABNORMAL LOW (ref 3.90–5.10)
RDW: 14 % (ref 11–15)
WBC: 7.37 10*3/uL (ref 3.10–9.50)

## 2021-04-27 LAB — GFR: EGFR: 9

## 2021-04-27 LAB — VANCOMYCIN, RANDOM: Vancomycin Random: 18.1 ug/mL

## 2021-04-27 MED ORDER — VANCOMYCIN HCL IN NACL 750-0.9 MG/250ML-% IV SOLN
750.0000 mg | Freq: Once | INTRAVENOUS | Status: AC
Start: 2021-04-27 — End: 2021-04-27
  Administered 2021-04-27: 750 mg via INTRAVENOUS
  Filled 2021-04-27: qty 750

## 2021-04-27 MED ORDER — SODIUM CHLORIDE 0.9 % IV BOLUS
250.0000 mL | INTRAVENOUS | Status: AC | PRN
Start: 2021-04-27 — End: 2021-04-27

## 2021-04-27 MED ORDER — SODIUM CHLORIDE 0.9 % IV BOLUS
100.0000 mL | INTRAVENOUS | Status: AC | PRN
Start: 2021-04-27 — End: 2021-04-27

## 2021-04-27 MED ORDER — ALBUMIN HUMAN 25 % IV SOLN
100.0000 mL | INTRAVENOUS | Status: AC | PRN
Start: 2021-04-27 — End: 2021-04-27

## 2021-04-27 MED ORDER — MORPHINE SULFATE 2 MG/ML IJ/IV SOLN (WRAP)
2.0000 mg | Status: AC | PRN
Start: 2021-04-27 — End: 2021-04-29
  Administered 2021-04-28: 2 mg via INTRAVENOUS
  Filled 2021-04-27: qty 1

## 2021-04-27 NOTE — Progress Notes (Signed)
PULM. CCM PROGRESS NOTE    Date Time: 04/27/21 12:50 PM  Patient Name: Matsuura,Karthika L      Assessment:     .  Subcentimeter pulmonary nodules  .  Hypertension  .  End-stage renal disease  .  Diabetes mellitus  .  Hyperlipidemia  .  Sleep apnea    Plan:     .  Subcentimeter pulmonary nodule, follow-up CT as an outpatient in 6 months to 1 year  .  Continue amlodipine, Coreg continue to monitor blood pressure  .  Under hemodialysis  .  Diabetes mellitus, continue Lantus insulin, along with sliding scale, continue to monitor blood sugar  .  Has been on atorvastatin at 40 mg daily  .  Continue nocturnal CPAP    Discussed with patient    Subjective:   Patient is a 66 y.o. female who is awake, alert, and comfortable.     Medications:     Current Facility-Administered Medications   Medication Dose Route Frequency    amLODIPine  10 mg Oral Daily    atorvastatin  40 mg Oral Daily    azithromycin  500 mg Intravenous Q24H    carvedilol  25 mg Oral Q12H SCH    cefTRIAXone  1 g Intravenous Q24H    dextrose  50 g Intravenous Once    gabapentin  100 mg Oral Q8H [place]    heparin (porcine)  5,000 Units Subcutaneous Q12H [place]    insulin glargine  40 Units Subcutaneous QHS    insulin lispro  1-4 Units Subcutaneous QHS    insulin lispro  1-8 Units Subcutaneous TID AC    insulin lispro  8 Units Subcutaneous TID AC    petrolatum   Topical Q12H    senna-docusate  2 tablet Oral QHS    sevelamer  800 mg Oral BID Meals    vancomycin  750 mg Intravenous Once    vancomycin   Intravenous See Admin Instructions       Review of Systems:     General ROS: negative for - chills, fever or night sweats  Ophthalmic ROS: negative for - double vision, excessive tearing, itchy eyes or photophobia  ENT ROS: negative for - headaches, nasal congestion, sore throat or vertigo  Respiratory ROS: negative for - cough, hemoptysis, orthopnea, pleuritic pain, shortness of breath, tachypnea or wheezing  Cardiovascular ROS: negative for - chest pain, edema,  orthopnea, rapid heart rate  Gastrointestinal ROS: negative for - abdominal pain, blood in stools, constipation, diarrhea, heartburn or nausea/vomiting  Musculoskeletal ROS: negative for - muscle pain  Neurological ROS: negative for - confusion, dizziness, headaches, speech problems, visual changes or weakness  Dermatological ROS: negative for dry skin, pruritus and rash    Physical Exam:   BP 116/52   Pulse (!) 58   Temp 97.8 F (36.6 C) (Axillary)   Resp 16   Ht 1.575 m (5\' 2" )   Wt 110.2 kg (242 lb 15.2 oz)   LMP  (LMP Unknown) Comment: post menopausal  SpO2 97%   BMI 44.44 kg/m     Intake and Output Summary (Last 24 hours) at Date Time    Intake/Output Summary (Last 24 hours) at 04/27/2021 1250  Last data filed at 04/26/2021 2328  Gross per 24 hour   Intake 1400 ml   Output --   Net 1400 ml       General appearance - alert,  and in no distress  Mental status - alert, oriented to person, place,  and time  Eyes - pupils equal and reactive, extraocular eye movements intact  Ears - no external ear lesions seen  Nose - no nasal discharge   Mouth - clear oral mucosa, moist   Neck - supple, no significant adenopathy  Chest - clear to auscultation, no wheezes, rales or rhonchi, symmetric air entry  Heart - normal rate, regular rhythm, normal S1, S2, no  rubs, clicks or gallops  Abdomen - soft, nontender, nondistended, no masses or organomegaly  Neurological - alert, oriented, normal speech, no focal findings or movement disorder noted  Musculoskeletal - no joint swelling or tenderness  Extremities -  no pedal edema, no clubbing or cyanosis  Skin - normal coloration, no rashes    Labs:     Recent Labs   Lab 04/27/21  0542 04/24/21  0053 04/24/21  0002 04/23/21  1352   WBC 7.37 6.15 6.44 6.41   Hgb 10.7* 11.3* 10.9* 11.1*   Hematocrit 35.5 37.4 35.6 36.2   Platelets 106* 98* 94* 103*   MCV 95.9 94.7 94.2 94.0   Neutrophils  --  79.6 75.3 70.5     Recent Labs   Lab 04/27/21  0542 04/25/21  2246 04/24/21  2119  04/24/21  1223 04/24/21  0828 04/24/21  0053 04/23/21  1352   Sodium 138  --  136 137  More results in Results Review 135 136   Potassium 4.3  --  5.4* 6.3*  More results in Results Review 5.8* 5.8*   Chloride 104  --  98* 101  More results in Results Review 100 99   CO2 24  --  22 23  More results in Results Review 23 23   BUN 54.0*  --  46.0* 65.0*  More results in Results Review 60.0* 57.0*   Creatinine 5.7*  --  6.5* 8.5*  More results in Results Review 8.2* 7.6*   Glucose 189* 381* 468* 381*  More results in Results Review 327* 320*   Calcium 7.5*  --  8.6 8.4*  More results in Results Review 8.4* 8.4*   Protein, Total  --   --   --   --   --  6.4 6.5   Albumin  --   --   --   --   --  2.6* 2.7*   AST (SGOT)  --   --   --   --   --  24 20   ALT  --   --   --   --   --  32 31   Alkaline Phosphatase  --   --   --   --   --  297* 279*   Bilirubin, Total  --   --   --   --   --  0.3 0.6   More results in Results Review = values in this interval not displayed.     Glucose:    Recent Labs   Lab 04/27/21  0542 04/25/21  2246 04/24/21  2119 04/24/21  1223 04/24/21  0828 04/24/21  0053 04/23/21  1352   Glucose 189* 381* 468* 381* 367* 327* 320*           Rads:   Radiological Procedure reviewed.  Radiology Results (24 Hour)       Procedure Component Value Units Date/Time    XR Ribs Left W PA Chest [[id number]] Collected: 04/27/21 7903    Order Status: Completed Updated: 04/27/21 0913    Narrative:  Indication: Left rib pain. Evaluate for fracture.    FINDINGS: 4 views. PA chest +3 views of the left ribs.    Comparison made to studies of 04/23/2021.    The heart size is enlarged. There are new interstitial markings in the  perihilar and lower lung field in this patient with underlying bullous  lung disease. This could represent infection or atypical congestive  heart failure. Clinical correlation.    Rib films demonstrate a right shoulder replacement. No fracture or  dislocation.  Splenic artery calcifications.       Impression:        1. No plain film evidence of fracture or osseous destruction.  2. New interstitial nodular infiltrates in the mid and lower lung field  which could represent infection or atypical congestive heart failure. No  pleural effusion    Jennell Corner, MD  04/27/2021 9:11 AM              Inda Castle, MD  Pulmonary and critical care  04/27/21

## 2021-04-27 NOTE — PT Eval Note (Signed)
[facility]  Physical Therapy Attempt Note    Patient:  [name] MRN#:  [id number]  Unit:  [place] [facility] INTERMEDIATE CARE Room/Bed:  I1791/F9579-G      Physical Therapy evaluation attempted on 04/27/2021 at 1:16 PM unable to complete secondary to RN requesting hold at this time 2/2 patient going off the floor for diaylsis.  Physical therapy will follow up at a later time/date as able.     RN aware.    Curt Jews, PT, DPT    Physical Medicine and [facility]   [phone]    04/27/2021  1:16 PM

## 2021-04-27 NOTE — Progress Notes (Signed)
Completed 4 hours of HD, 4.6 L UF tolerated well. For extra HD again tomorrow. Report given to [facility] RN.     04/27/21 1830   Treatment Summary   Time Off Machine 1820   Duration of Treatment (Hours) 4   Treatment Type 1:1   Dialyzer Clearance Moderately streaked   Fluid Volume Off (mL) 5000   Prime Volume (mL) 200   Rinseback Volume (mL) 200   Fluid Given: Normal Saline (mL) 0   Fluid Given: PRBC  0 mL   Fluid Given: Albumin (mL) 0   Fluid Given: Other (mL) 0   Total Fluid Given 400   Hemodialysis Net Fluid Removed 4600   Post Treatment Assessment   Post-Treatment Weight (Kg) -4.6   Patient Response to Treatment tolerated well   Graft/Fistula Left   No placement date or time found.   Access Type: Arteriovenous Fistula  Orientation: Left  Graft/Fistula Location: Upper Arm   Fistula/ Graft Assessment Abnormalities WDL   Needle Size 15 Gauge   Cannulation Sites held Arterial (min) 10   Cannulation Sites held Venous (min) 10   Hemostasis Achieved Yes   Vitals   Temp 98 F (36.7 C)   Heart Rate 64   Resp Rate 20   BP 161/76   SpO2 99 %   O2 Device Nasal cannula   Assessment   Mental Status Alert;Oriented;Cooperative   Cardiac (WDL) WDL   Cardiac Regularity Regular   Cardiac Symptoms None   Cardiac Rhythm Normal Sinus Rhythm   Bilateral Breath Sounds Clear   Cough Spontaneous   Edema  WDL   Generalized Edema Non Pitting Edema   RLE Edema +1   LLE Edema +1   General Skin Color Appropriate for ethnicity   Skin Condition/Temp Warm;Dry   Gastrointestinal (WDL) WDL   Abdomen Inspection Soft;Nondistended   GI Symptoms None   Mobility Bed   Pain Assessment   Charting Type Assessment   Pain Scale Used Numeric Scale (0-10)   Numeric Pain Scale   Pain Score 0   POSS Score 1   Education   Person taught Patient   Knowledge basis Substantial   Topics taught Access care;Fluid Management;Procedure   Teaching Tools Explain   Reponse Verbalizes Understanding   Bedside Nurse Communication   Name of bedside RN - post dialysis Woinshet  Mamo

## 2021-04-27 NOTE — UM Notes (Addendum)
[facility] Utilization Review   NPI #[id number], Tax ID [id number]  Please call Milas Gain MSN RN CCM @  [phone]  with any questions or concerns.  Email:  [email or web address]  Fax final authorization and requests for additional information to [phone]    CONCURRENT REVIEW FOR: 04/27/21      PATIENT NAME: [name],[name] / 04/29/1955 / AGE: 66 y.o.      S/I: NEW INFILTRATES ON IMAGING     V/S:  Vital Sign Min/Max (last 24 hours)    Value Min Max   Temp 97.7 F (36.5 C) 98 F (36.7 C)   Heart Rate 57 Abnormal  63   Resp Rate 8 Abnormal  21   BP: Systolic 938 182   BP: Diastolic 52 89   SpO2 96 % 100 %       Labs -   Latest Reference Range & Units 04/27/21 05:42   Hemoglobin 11.4 - 14.8 g/dL 10.7 ([name])   Platelet Count 142 - 346 x10 3/uL 106 ([name])   RBC 3.90 - 5.10 x10 6/uL 3.70 ([name])      Latest Reference Range & Units 04/27/21 05:42   Glucose 70 - 100 mg/dL 189 (H)   BUN 7.0 - 21.0 mg/dL 54.0 (H)   Creatinine 0.4 - 1.0 mg/dL 5.7 (H)   Calcium 8.5 - 10.5 mg/dL 7.5 ([name])         Radiologic Studies -   XR Ribs Left W PA Chest    Result Date: 04/27/2021  1. No plain film evidence of fracture or osseous destruction. 2. New interstitial nodular infiltrates in the mid and lower lung field which could represent infection or atypical congestive heart failure. No pleural effusion Diane Corner, MD 04/27/2021 9:11 AM          MD NOTES:  PULM. CCM PROGRESS NOTE  PLAN  Subcentimeter pulmonary nodule, follow-up CT as an outpatient in 6 months to 1 year  .  Continue amlodipine, Coreg continue to monitor blood pressure  .  Under hemodialysis  .  Diabetes mellitus, continue Lantus insulin, along with sliding scale, continue to monitor blood sugar  .  Has been on atorvastatin at 40 mg daily  .  Continue nocturnal CPAP       PER MEDICINE NOTES  Subjective:   Patient seen and examined :  Continues to complain of chest pain started on morphine.  Assessment:   Chest pain reproducible  Acute encephalopathy , status  post RRT( chest compression x 1 ) , found with hypoxia, head CT negative.  Status post mechanical fall , slipped in bathroom injury to lower lip, chin.  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  Hyperkalemia.  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  Rib cage x-ray negative for fracture , cardiology following  Encephalopathy resolved.  Continue vancomycin at dialysis.  Until 05/27/2021  Hyperkalemia resolved.  Followed by podiatry , status post nail removal  subcentimeter  pulmonary nodule, outpatient follow-up in 6 months to 1 year suggested  Blood Sugar Improving , cont   Lantus dose ,  Premeal insulin,  sliding scale coverage  On amlodipine Coreg, monitor blood pressure.  On Rocephin  , Off  doxycycline, Vanco with dialysis till 05/27/2021  Continue statin.  Monitor H&H and platelet count.  Does Not Use CPAP   Fall precaution, OT PT  DVT prophylaxis  DCP When Stable   Given unilateral breast swelling-left concern for inflammatory breast disease.  CT scan with stranding, discussed with Dr. Halford Chessman, consultation requested        Current Medications:    Scheduled Meds:  Current Facility-Administered Medications   Medication Dose Route Frequency    amLODIPine  10 mg Oral Daily    atorvastatin  40 mg Oral Daily    azithromycin  500 mg Intravenous Q24H    carvedilol  25 mg Oral Q12H [place]    cefTRIAXone  1 g Intravenous Q24H    dextrose  50 g Intravenous Once    gabapentin  100 mg Oral Q8H [place]     heparin (porcine)  5,000 Units Subcutaneous Q12H [place]    insulin glargine  40 Units Subcutaneous QHS    insulin lispro  1-4 Units Subcutaneous QHS    insulin lispro  1-8 Units Subcutaneous TID AC    insulin lispro  8 Units Subcutaneous TID AC    petrolatum   Topical Q12H    senna-docusate  2 tablet Oral QHS    sevelamer  800 mg Oral BID Meals    vancomycin  750 mg Intravenous Once    vancomycin   Intravenous See Admin Instructions     PRN   PERCOCET 1 TAB PO--X2  HYDRALAZINE 10 MG IV--X1

## 2021-04-27 NOTE — Progress Notes (Signed)
[facility] PROGRESS NOTE  [id number]      Date Time: 04/27/21 4:35 PM  Patient Name: [name]  Attending Physician: Barbaraann Faster, MD    CC: follow-up ESRD     Assessment:     ESRD on HD MWF at [facility] - followed by Dr. Randel Pigg  Hyperkalemia due to missed HD & hyperglycemia- last HD on 3/29, 4/2  Mild hypocalcemia due to CKD  Anemia in CKD, hgb at goal  HTN - controlled  DM type 2 with hyperglycemia  Hyperphosphatemia: PTH 1579 on 3/23  Secondary hyperparathyroidism  Fall   Abdominal wall abscess MRSA on vancomycin     Recommendations:     HD routine per MWF schedule   Plan for additional UF Thursday  Vanc per level     Case discussed with: RN, pt      Bayley Hurn Oretha Ellis, MD  [facility]  [id number] (office)      Subjective:     I saw pt on HD    Dialysis Treatment Data:  Blood flow rate: 400 mL/min  Dialysate flow rate: 700 mL/min  Ultrafiltration rate: 1250 mL/hr    Dialysate potassium: 2 mEq/L  Dialysate calcium: 2.5 mEq/L    Arterial pressure: -194 mmHg  Venous pressure: 128mmHg      Review of Systems:     No cp  No sob  R Toe pain      Physical Exam:     Vitals:    04/27/21 1515 04/27/21 1530 04/27/21 1545 04/27/21 1600   BP: 140/62 127/61 158/64 138/65   Pulse: 61 61 62 62   Resp: 20 20 20 19    Temp:       TempSrc:       SpO2:       Weight:       Height:           Intake and Output Summary (Last 24 hours) at Date Time    Intake/Output Summary (Last 24 hours) at 04/27/2021 1635  Last data filed at 04/26/2021 2328  Gross per 24 hour   Intake 1400 ml   Output --   Net 1400 ml         General: awake, alert, oriented x 3, no acute distress  Cardiovascular: regular rate and rhythm, no murmurs, rubs or gallops  Lungs: clear to auscultation bilaterally, without wheezing, rhonchi, or rales  Abdomen: soft, non-tender, non-distended, normoactive bowel sounds  Extremities: no clubbing, cyanosis, or edema      Meds:      Scheduled Meds: PRN Meds:    amLODIPine, 10 mg,  Oral, Daily  atorvastatin, 40 mg, Oral, Daily  azithromycin, 500 mg, Intravenous, Q24H  carvedilol, 25 mg, Oral, Q12H SCH  cefTRIAXone, 1 g, Intravenous, Q24H  dextrose, 50 g, Intravenous, Once  gabapentin, 100 mg, Oral, Q8H SCH  heparin (porcine), 5,000 Units, Subcutaneous, Q12H SCH  insulin glargine, 40 Units, Subcutaneous, QHS  insulin lispro, 1-4 Units, Subcutaneous, QHS  insulin lispro, 1-8 Units, Subcutaneous, TID AC  insulin lispro, 8 Units, Subcutaneous, TID AC  petrolatum, , Topical, Q12H  senna-docusate, 2 tablet, Oral, QHS  sevelamer, 800 mg, Oral, BID Meals  vancomycin, 750 mg, Intravenous, Once  vancomycin, , Intravenous, See Admin Instructions          Continuous Infusions:   acetaminophen, 650 mg, Q6H PRN   Or  acetaminophen, 650 mg, Q6H PRN  albumin human, 100 mL, PRN  albuterol-ipratropium, 3 mL, Q6H PRN  dextrose, 15 g of glucose, PRN   Or  dextrose, 12.5 g, PRN   Or  dextrose, 12.5 g, PRN   Or  glucagon (rDNA), 1 mg, PRN  hydrALAZINE, 10 mg, Q6H PRN  lactulose, 10 g, Daily PRN  magnesium sulfate, 1 g, PRN  melatonin, 3 mg, QHS PRN  morphine, 2 mg, Q4H PRN  naloxone, 0.2 mg, PRN  ondansetron, 4 mg, Q6H PRN   Or  ondansetron, 4 mg, Q6H PRN  oxyCODONE-acetaminophen, 1 tablet, Q4H PRN  polyethylene glycol, 17 g, Daily PRN  sodium chloride, 100 mL, Q1H PRN  sodium chloride, 250 mL, PRN              Labs:     Recent Labs   Lab 04/27/21  0542 04/24/21  0053 04/24/21  0002   WBC 7.37 6.15 6.44   Hgb 10.7* 11.3* 10.9*   Hematocrit 35.5 37.4 35.6   Platelets 106* 98* 94*       Recent Labs   Lab 04/27/21  0542 04/25/21  2246 04/24/21  2119 04/24/21  1223 04/24/21  0828 04/24/21  0053 04/23/21  1352   Sodium 138  --  136 137  More results in Results Review 135 136   Potassium 4.3  --  5.4* 6.3*  More results in Results Review 5.8* 5.8*   Chloride 104  --  98* 101  More results in Results Review 100 99   CO2 24  --  22 23  More results in Results Review 23 23   BUN 54.0*  --  46.0* 65.0*  More results in  Results Review 60.0* 57.0*   Creatinine 5.7*  --  6.5* 8.5*  More results in Results Review 8.2* 7.6*   Calcium 7.5*  --  8.6 8.4*  More results in Results Review 8.4* 8.4*   Albumin  --   --   --   --   --  2.6* 2.7*   Glucose 189* 381* 468* 381*  More results in Results Review 327* 320*   EGFR 9.0  --  7.7 5.7  More results in Results Review 5.9 6.5   More results in Results Review = values in this interval not displayed.               Invalid input(s): LEUKOCYTESUR        Imaging personally reviewed, including: XR Ribs Left W PA Chest    Result Date: 04/27/2021  1. No plain film evidence of fracture or osseous destruction. 2. New interstitial nodular infiltrates in the mid and lower lung field which could represent infection or atypical congestive heart failure. No pleural effusion Jennell Corner, MD 04/27/2021 9:11 AM         Signed by: Renard Hamper, MD

## 2021-04-27 NOTE — Plan of Care (Signed)
Problem: Nutrition  Goal: Nutritional intake is adequate  Outcome: Progressing  Flowsheets (Taken 04/27/2021 1438)  Nutritional intake is adequate:   Allow adequate time for meals   Monitor daily weights     Problem: Renal Instability  Goal: Nutritional intake is adequate  Outcome: Progressing  Flowsheets (Taken 04/27/2021 1438)  Nutritional intake is adequate:   Allow adequate time for meals   Monitor daily weights  Goal: Fluid and electrolyte balance are achieved/maintained  Outcome: Progressing  Flowsheets (Taken 04/27/2021 1438)  Fluid and electrolyte balance are achieved/maintained:   Monitor/assess lab values and report abnormal values   Assess and reassess fluid and electrolyte status  Goal: Free from infection  Outcome: Progressing  Flowsheets (Taken 04/27/2021 1438)  Free from infection:   Assess need for indwelling catheter every shift and discuss with LIP   Monitor/assess for signs and symptoms of infection

## 2021-04-27 NOTE — Progress Notes (Signed)
Progress Note- Pharmacy Vancomycin Dosing Consult:  Day 4  Vancomycin Indication: MRSA Abdominal Wall Infection   Vancomycin Monitoring Goal: Pre-HD random level of 15-25 mcg/mL    Imaging:  4/1 CT (A/P): Scarring in the left paramedian abdominal wall with a 1 cm area of low attenuation in the subcutaneous tissues which may represent residual fluid. A smaller than on prior exam.    Pertinent Cultures:    Date Source  Organism & Pertinent Susceptibilities     3/23 Wound cx x2 (Abdominal wall)  MRSA                  As appropriate, contact physician to consider change in therapy if cultures grow organism other than MRSA     Serum creatinine: 5.7 mg/dL (H) 04/27/21 0542  Estimated creatinine clearance: 11.4 mL/min (A)    Baseline SCr: ESRD on HD Mon, Wed, Fri   Nephrotoxic drugs administered in the past 48 hours: Advanced Age (>65Y)    Assessment   Measured Vancomycin Level: Pre-HD random level = 18.1 (therapeutic).   Recent Labs   Lab 04/27/21  0542 04/25/21  0356 04/24/21  1401   Vancomycin Random 18.1 26.4 20.4         Recommendations:  Re-dose vancomycin (~7 mg/kg) = 750 mg IV x once after HD session.   New Vancomycin level: Pre-HD random level ordered on 4/7 with AM labs.     Thank you,  Johann Capers, [facility]

## 2021-04-27 NOTE — Nursing Progress Note (Addendum)
Left for dialysis Tx  in stable condition. O2 running @ 3 LPM via N/C.   1905 returned to the unit. Stable, B/P 155/67. Safety and comfort maintained  Report given to night shift.

## 2021-04-27 NOTE — Progress Notes (Signed)
Patient alert, oriented x 3. Denies any pain. Sleepy, Vital signs within normal range. For routine HD today with high volume UF as toloerated. Will monitor closely on dialysis.     04/27/21 1415   Bedside Nurse Communication   Name of bedside RN - pre dialysis Woinshet Mamo   Treatment Initiation- With Dialysis Precautions   Time Out/Safety Check Completed Yes   Consent for HD signed for this hospitalization (Date) 03/04/21   Consent for HD signed for this hospitalization (Time) 1540   Preferred language No   Blood Consent Verified N/A   Dialysis Precautions All Connections Secured;Saline Line Double Clamped;Venous Parameters Set;Arterial Parameters Set;Air Foam Detecctor Engaged   Dialysis Treatment Type Routine   Special Considerations contact isolation   Is patient diabetic? Yes   RO/Hemodialysis Architectural technologist   Is Total Chlorine less than 0.1 ppm? Yes   Orignial Total Chlorine Testing Time 1330   RO/Hemodialysis Facilities manager Number 2    Machine Serial Number [id number]   RO # 26   RO Serial # [id number]   Water Hardness NA   pH 7.2   Pressure Test Verified Yes   Alarms Verified Passed   Machine Temperature 97.7 F (36.5 C)   Alarms Verified Yes   Na+ mEq (Machine) 138 mEq   Bicarb mEq (Machine) 35 mEq   Hemodialysis Conductivity (Machine) 13.8   Hemodialysis Conductivity (Meter) 13.7   Dialyzer Lot Number [id number]   Tubing Lot Number [id number]   RO Machine Log Completed Yes   Hepatitis Status   HBsAg (Antigen) Result Negative   HBsAg Date Drawn 04/13/21   HBsAg Repeat Draw Due Date 05/11/21   HBsAb (Antibody) Result Non-Reactive   Hepatitis C Antibody Non-Reactive   Dialysis Weight   Pre-Treatment Weight (Kg) 110.2   Scale Type ICU Bed Scale   Vitals   Temp 98 F (36.7 C)   Heart Rate 60   Resp Rate 12   BP 144/65   SpO2 99 %   O2 Device Nasal cannula   Assessment   Mental Status Alert;Oriented;Cooperative   Cardiac (WDL) WDL   Cardiac Regularity Regular   Cardiac Symptoms None   Cardiac  Rhythm Normal Sinus Rhythm   Respiratory  WDL   Respiratory Pattern Regular   Bilateral Breath Sounds Diminished   Cough Spontaneous   Edema  WDL   Generalized Edema Non Pitting Edema   RLE Edema +1   LLE Edema +1   General Skin Color Appropriate for ethnicity   Skin Condition/Temp Warm;Dry   Gastrointestinal (WDL) WDL   Abdomen Inspection Soft;Nondistended   GI Symptoms None   Mobility Bed   Graft/Fistula Left   No placement date or time found.   Access Type: Arteriovenous Fistula  Orientation: Left  Graft/Fistula Location: Upper Arm   Fistula/ Graft Assessment Abnormalities WDL   Needle Size 15 Gauge   Pain Assessment   Charting Type Assessment   Pain Scale Used Numeric Scale (0-10)   Numeric Pain Scale   Pain Score 6   POSS Score 1   Hemodialysis Comments   Pre-Hemodialysis Comments time out done   Hemodialysis History Information   Outpatient Dialysis Unit [facility]   Chronic Dialysis Patient Yes   Outpatient Nephrologist Dr Conley Canal

## 2021-04-27 NOTE — Plan of Care (Addendum)
Neuro: AAOx4. Denies n/v/SOB. On pain management.  Cardiac: NSR on monitor  Resp: SpO2 98% on 3LPM NC.   GI: consistent carb  GU: External cath in place. Diminished urine out put. No BM during shift. See flow sheet for details.  Safety measures in place. CB within reach   Problem: Moderate/High Fall Risk Score >5  Goal: Patient will remain free of falls  Flowsheets (Taken 04/27/2021 1054)  High (Greater than 13):   HIGH-Consider use of low bed   HIGH-Initiate use of floor mats as appropriate   HIGH-Pharmacy to initiate evaluation and intervention per protocol   HIGH-Utilize chair pad alarm for patient while in the chair   HIGH-Bed alarm on at all times while patient in bed   HIGH-Visual cue at entrance to patient's room     Problem: Fluid and Electrolyte Imbalance/ Endocrine  Goal: Fluid and electrolyte balance are achieved/maintained  Flowsheets (Taken 04/27/2021 1054)  Fluid and electrolyte balance are achieved/maintained:   Monitor intake and output every shift   Assess and reassess fluid and electrolyte status   Observe for seizure activity and initiate seizure precautions if indicated   Monitor for muscle weakness   Provide adequate hydration   Monitor/assess lab values and report abnormal values     Problem: Nutrition  Goal: Nutritional intake is adequate  Flowsheets (Taken 04/27/2021 1054)  Nutritional intake is adequate:   Monitor daily weights   Assist patient with meals/food selection   Allow adequate time for meals   Encourage/perform oral hygiene as appropriate   Include patient/patient care companion in decisions related to nutrition   Assess anorexia, appetite, and amount of meal/food tolerated     Problem: Diabetes: Glucose Imbalance  Goal: Blood glucose stable at established goal  Flowsheets (Taken 04/27/2021 1054)  Blood glucose stable at established goal:   Monitor lab values   Monitor intake and output.  Notify LIP if urine output is < 30 mL/hour.   Include patient/family in decisions related to  nutrition/dietary selections   Coordinate medication administration with meals, as indicated   Assess for hypoglycemia /hyperglycemia   Monitor/assess vital signs   Ensure appropriate diet and assess tolerance   Ensure adequate hydration   Ensure appropriate consults are obtained (Nutrition, Diabetes Education, and Case Management)   Ensure patient/family has adequate teaching materials     Problem: Renal Instability  Goal: Nutritional intake is adequate  Flowsheets (Taken 04/27/2021 1054)  Nutritional intake is adequate:   Monitor daily weights   Assist patient with meals/food selection   Allow adequate time for meals   Encourage/perform oral hygiene as appropriate   Include patient/patient care companion in decisions related to nutrition   Assess anorexia, appetite, and amount of meal/food tolerated  Goal: Fluid and electrolyte balance are achieved/maintained  Flowsheets (Taken 04/27/2021 1054)  Fluid and electrolyte balance are achieved/maintained:   Monitor intake and output every shift   Monitor/assess lab values and report abnormal values   Assess for confusion/personality changes   Monitor daily weight   Provide adequate hydration   Assess and reassess fluid and electrolyte status   Observe for seizure activity and initiate seizure precautions if indicated   Observe for cardiac arrhythmias   Monitor for muscle weakness     Problem: Safety  Goal: Patient will be free from injury during hospitalization  Flowsheets (Taken 04/27/2021 1054)  Patient will be free from injury during hospitalization:   Assess patient's risk for falls and implement fall prevention plan of care per policy  Provide and maintain safe environment   Ensure appropriate safety devices are available at the bedside   Include patient/ family/ care giver in decisions related to safety   Assess for patients risk for elopement and implement Elopement Risk Plan per policy   Hourly rounding   Use appropriate transfer methods

## 2021-04-27 NOTE — Progress Notes (Signed)
PROGRESS NOTE    Date Time: 04/27/21 6:13 PM  Patient Name: [name], [name]  Patient status: [facility] Day: 0      Assessment:   Chest pain reproducible  Acute encephalopathy , status post RRT( chest compression x 1 ) , found with hypoxia, head CT negative.  Status post mechanical fall , slipped in bathroom injury to lower lip, chin.  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  Hyperkalemia.  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  Rib cage x-ray negative for fracture , cardiology following  Encephalopathy resolved.  Continue vancomycin at dialysis.  Until 05/27/2021  Hyperkalemia resolved.  Followed by podiatry , status post nail removal  subcentimeter pulmonary nodule, outpatient follow-up in 6 months to 1 year suggested  Blood Sugar Improving , cont   Lantus dose ,  Premeal insulin,  sliding scale coverage  On amlodipine Coreg, monitor blood pressure.  On Rocephin  , Off  doxycycline, Vanco with dialysis till 05/27/2021  Continue statin.  Monitor H&H and platelet count.  Does Not Use CPAP   Fall precaution, OT PT  DVT prophylaxis  DCP When Stable   Labs and data reviewed.  Discussed with patient and nurse  Given unilateral breast swelling-left concern for inflammatory breast disease.  CT scan with stranding, discussed with Dr. Halford Chessman, consultation requested  Patient has scheduled mammogram in near future.  D/w Dr.Jassel   Subjective:   Patient seen and examined  :  Continues to complain of chest pain started on morphine.  Eats well.    Review of Systems:   General ROS:   Afebrile.  Complain of chest pain  No palpitation  No shortness of breath, cough, wheezing.  Denies nausea vomiting abdominal pain.  Generalized weakness.  Medications:     Current Facility-Administered Medications   Medication Dose Route Frequency    amLODIPine  10 mg Oral Daily    atorvastatin  40 mg Oral Daily    azithromycin  500 mg Intravenous Q24H    carvedilol  25 mg Oral Q12H SCH    cefTRIAXone  1 g Intravenous Q24H    dextrose  50 g Intravenous Once    gabapentin  100 mg Oral Q8H [place]    heparin (porcine)  5,000 Units Subcutaneous Q12H [place]    insulin glargine  40 Units Subcutaneous QHS    insulin lispro  1-4 Units Subcutaneous QHS    insulin lispro  1-8 Units Subcutaneous TID AC    insulin lispro  8 Units Subcutaneous TID AC    petrolatum   Topical Q12H    senna-docusate  2 tablet Oral QHS    sevelamer  800 mg Oral BID Meals    vancomycin  750 mg Intravenous Once    vancomycin   Intravenous See Admin Instructions       acetaminophen **OR** acetaminophen, albuterol-ipratropium, dextrose **OR** dextrose **OR** dextrose **OR** glucagon (rDNA), hydrALAZINE, lactulose, magnesium  sulfate, melatonin, morphine, naloxone, ondansetron **OR** ondansetron, oxyCODONE-acetaminophen, polyethylene glycol    Physical Exam:   BP 116/57   Pulse 60   Temp 98 F (36.7 C)   Resp 12   Ht 1.575 m (5\' 2" )   Wt 110.2 kg (242 lb 15.2 oz)   LMP  (LMP Unknown) Comment: post menopausal  SpO2 99%   BMI 44.44 kg/m     Intake and Output Summary (Last 24 hours) at Date Time    Intake/Output Summary (Last 24 hours) at 04/27/2021 1813  Last data filed at 04/26/2021 2328  Gross per 24 hour   Intake 1400 ml   Output --   Net 1400 ml       General: Awake alert  Obese : BMI 44  Neck: Supple no JVD  ENT: PERRLA , EOMI   Lungs: Clear to auscultation no crackles no wheezing  Heart: Regular rate and rhythm no gallop  Left breast swollen  , engorged.  Abdomen: Soft nontender bowel sounds positive  Extremities: Right great toenail removed.  Bilateral lower extremity edema  Neuro: Grossly intact    Labs:     CBC w/Diff CMP   Recent Labs   Lab 04/27/21  0542 04/24/21  0053 04/24/21  0002 04/23/21  1352   WBC 7.37 6.15 6.44 6.41   Hgb 10.7* 11.3* 10.9* 11.1*   Hematocrit 35.5 37.4 35.6 36.2   Platelets 106* 98* 94* 103*   MCV 95.9 94.7 94.2 94.0   Neutrophils  --  79.6 75.3 70.5       PT/INR         Recent Labs   Lab 04/27/21  0542 04/25/21  2246 04/24/21  2119 04/24/21  1223 04/24/21  0828 04/24/21  0053 04/23/21  1352   Sodium 138  --  136 137  More results in Results Review 135 136   Potassium 4.3  --  5.4* 6.3*  More results in Results Review 5.8* 5.8*   Chloride 104  --  98* 101  More results in Results Review 100 99   CO2 24  --  22 23  More results in Results Review 23 23   BUN 54.0*  --  46.0* 65.0*  More results in Results Review 60.0* 57.0*   Creatinine 5.7*  --  6.5* 8.5*  More results in Results Review 8.2* 7.6*   Glucose 189* 381* 468* 381*  More results in Results Review 327* 320*   Calcium 7.5*  --  8.6 8.4*  More results in Results Review 8.4* 8.4*   Protein, Total  --   --   --   --   --  6.4 6.5   Albumin  --   --   --   --   --  2.6* 2.7*   AST (SGOT)  --   --   --   --   --  24 20   ALT  --   --   --   --   --  32 31   Alkaline Phosphatase  --   --   --   --   --  297* 279*   Bilirubin, Total  --   --   --   --   --  0.3 0.6   More results in Results Review = values in this interval not displayed.      Glucose POCT   Recent Labs   Lab 04/27/21  0542 04/25/21  2246 04/24/21  2119 04/24/21  1223 04/24/21  0828 04/24/21  0053 04/23/21  1352   Glucose 189* 381* 468* 381* 367* 327* 320*        Recent Labs   Lab 04/25/21  2246 04/25/21  1820 04/25/21  0356   hs Troponin-I 72.2* 59.2* 49.8*       Rads:   XR Ribs Left W PA Chest    Result Date: 04/27/2021  1. No plain film evidence of fracture or osseous destruction. 2. New interstitial nodular  infiltrates in the mid and lower lung field which could represent infection or atypical congestive heart failure. No pleural effusion Jennell Corner, MD 04/27/2021 9:11 AM       Rande Roylance Felipe Drone, MD  04/27/2021    *This note was generated by the Epic EMR system/ Dragon speech recognition and may contain inherent errors or omissions not intended by the user. Grammatical errors, random word insertions, deletions, pronoun errors and incomplete sentences are occasional consequences of this technology due to software limitations. Not all errors are caught or corrected. If there are questions or concerns about the content of this note or information contained within the body of this dictation they should be addressed directly with the author for clarification

## 2021-04-28 LAB — BASIC METABOLIC PANEL
Anion Gap: 10 (ref 5.0–15.0)
BUN: 37 mg/dL — ABNORMAL HIGH (ref 7.0–21.0)
CO2: 24 mEq/L (ref 17–29)
Calcium: 8 mg/dL — ABNORMAL LOW (ref 8.5–10.5)
Chloride: 103 mEq/L (ref 99–111)
Creatinine: 4.7 mg/dL — ABNORMAL HIGH (ref 0.4–1.0)
Glucose: 234 mg/dL — ABNORMAL HIGH (ref 70–100)
Potassium: 4.4 mEq/L (ref 3.5–5.3)
Sodium: 137 mEq/L (ref 135–145)

## 2021-04-28 LAB — CBC
Absolute NRBC: 0 10*3/uL (ref 0.00–0.00)
Hematocrit: 37.3 % (ref 34.7–43.7)
Hgb: 11 g/dL — ABNORMAL LOW (ref 11.4–14.8)
MCH: 28.6 pg (ref 25.1–33.5)
MCHC: 29.5 g/dL — ABNORMAL LOW (ref 31.5–35.8)
MCV: 96.9 fL — ABNORMAL HIGH (ref 78.0–96.0)
MPV: 12.2 fL (ref 8.9–12.5)
Nucleated RBC: 0 /100 WBC (ref 0.0–0.0)
Platelets: 112 10*3/uL — ABNORMAL LOW (ref 142–346)
RBC: 3.85 10*6/uL — ABNORMAL LOW (ref 3.90–5.10)
RDW: 14 % (ref 11–15)
WBC: 7.28 10*3/uL (ref 3.10–9.50)

## 2021-04-28 LAB — VANCOMYCIN, RANDOM: Vancomycin Random: 21.9 ug/mL

## 2021-04-28 LAB — GFR: EGFR: 11.2

## 2021-04-28 LAB — GLUCOSE WHOLE BLOOD - POCT
Whole Blood Glucose POCT: 206 mg/dL — ABNORMAL HIGH (ref 70–100)
Whole Blood Glucose POCT: 189 mg/dL — ABNORMAL HIGH (ref 70–100)
Whole Blood Glucose POCT: 299 mg/dL — ABNORMAL HIGH (ref 70–100)
Whole Blood Glucose POCT: 261 mg/dL — ABNORMAL HIGH (ref 70–100)

## 2021-04-28 MED ORDER — VANCOMYCIN HCL 500 MG IV SOLR (ROUNDING INGRED)
500.0000 mg | Freq: Once | INTRAVENOUS | Status: AC
Start: 2021-04-28 — End: 2021-04-28
  Administered 2021-04-28: 500 mg via INTRAVENOUS
  Filled 2021-04-28: qty 500

## 2021-04-28 MED ORDER — SODIUM CHLORIDE 0.9 % IV BOLUS
100.0000 mL | INTRAVENOUS | Status: AC | PRN
Start: 2021-04-28 — End: 2021-04-28

## 2021-04-28 MED ORDER — SODIUM CHLORIDE 0.9 % IV BOLUS
250.0000 mL | INTRAVENOUS | Status: AC | PRN
Start: 2021-04-28 — End: 2021-04-28

## 2021-04-28 NOTE — Nursing Progress Note (Signed)
Neuro: AO x 4 and follows commands; drowsy at times  Resp:  Bilateral breath sounds diminished. CPAP for 3 hours. Educated pt. of importance of CPAP. Continued to refused and non compliant for the rest of night.   CV: Regular, SBP 150-165s. PRN hydralazine given x1.   GI:  Active bowel sounds present.  Cardiac Consistent carb diet.   GU: Diminished urine output. Exth cath in place  M/Activity: Active - in all extremities; BLE- Limited movement   Integ: WDL for ethnicity. Scattered bruising. Laceration and discoloration on R great toe. Wound care completed.   Pain: Pain meds administered. PRN oxy x 1 and morphine x 1.  DVT Prophylaxis: Bilateral SCDs on LEs. Refused c/o leg pain  Safety: Moderate Falls Risk.Bed remains in lowest position and 3/4 side rails up.  Shift Events: n/a  Plan of Care: IV Abx, Dialysis 04/28/21, ACCU checks, pain management     Pt resting comfortably in bed with no signs of distress or discomfort  Call light & personal belongings within reach, bed alarm on. Floor mat at bedside.     Problem: Potential for Aspiration  Goal: Risk of aspiration will be minimized  Outcome: Progressing  Flowsheets (Taken 04/28/2021 0041)  Risk of aspiration will be minimized:   Dysphagia screen: Keep patient NPO if patient fails screening   Assess and monitor ability to swallow   Place patient up in chair to eat, if possible   Head of bed up 90 degrees to eat if unable to be out of bed   Supervise patient during oral intake   Instruct patient to take small bites   Instruct patient to take small single sips of liquid     Problem: Renal Instability  Goal: Fluid and electrolyte balance are achieved/maintained  Outcome: Progressing  Flowsheets (Taken 04/28/2021 0041)  Fluid and electrolyte balance are achieved/maintained:   Monitor intake and output every shift   Assess for confusion/personality changes   Assess and reassess fluid and electrolyte status   Provide adequate hydration   Monitor daily weight     Problem:  Safety  Goal: Patient will be free from injury during hospitalization  Outcome: Progressing  Flowsheets (Taken 04/28/2021 0041)  Patient will be free from injury during hospitalization:   Assess patient's risk for falls and implement fall prevention plan of care per policy   Provide and maintain safe environment   Use appropriate transfer methods   Hourly rounding   Ensure appropriate safety devices are available at the bedside     Problem: Side Effects from Pain Analgesia  Goal: Patient will experience minimal side effects of analgesic therapy  Outcome: Progressing  Flowsheets (Taken 04/28/2021 0041)  Patient will experience minimal side effects of analgesic therapy:   Monitor/assess patient's respiratory status (RR depth, effort, breath sounds)   Assess for changes in cognitive function   Prevent/manage side effects per LIP orders (i.e. nausea, vomiting, pruritus, constipation, urinary retention, etc.)

## 2021-04-28 NOTE — Progress Notes (Addendum)
PROGRESS NOTE    Date Time: 04/28/21 6:52 AM  Patient Name: [name], [name]  Patient status: [facility] Day: 0      Assessment:   Chest pain reproducible  Acute encephalopathy , status post RRT( chest compression x 1 ) , found with hypoxia, head CT negative.  Status post mechanical fall , slipped in bathroom injury to lower lip, chin.  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  Hyperkalemia.  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  Rib cage x-ray negative discussed with patient  Encephalopathy resolved.  Continue vancomycin at dialysis.  Until 05/27/2021  Hyperkalemia resolved.  Followed by podiatry , status post nail removal  subcentimeter pulmonary nodule, outpatient follow-up in 6 months to 1 year suggested  Blood Sugar Improving , cont   Lantus dose ,  Premeal insulin,  sliding scale coverage  On amlodipine Coreg, monitor blood pressure.  On Rocephin  , Off  doxycycline, Vanco with dialysis till 05/27/2021  Continue statin.  Monitor H&H and platelet count.  Does Not Use CPAP   Fall precaution, OT PT  DVT prophylaxis  Discharge planning when stable  Labs and data reviewed.  Discussed with patient and nurse  Discussed with Dr. Halford Chessman left breast most likely dependent edema aggravated by trauma  Patient has appointment for mammogram as an outpatient  Subjective:   Patient seen and examined :  Had dialysis.  Continue to complain of chest pain requiring morphine  HD In progress    Review of Systems:   General ROS:   Afebrile.  Complain of chest pain no palpitation.  Complain of shortness of breath, dyspnea.  Denies nausea vomiting abdominal pain.  Generalized weakness.  Medications:     Current Facility-Administered Medications   Medication Dose Route Frequency    amLODIPine  10 mg Oral Daily    atorvastatin  40 mg Oral Daily    carvedilol  25 mg Oral Q12H SCH    cefTRIAXone  1 g Intravenous Q24H    dextrose  50 g Intravenous Once    gabapentin  100 mg Oral Q8H [place]    heparin (porcine)  5,000 Units Subcutaneous Q12H [place]    insulin glargine  40 Units Subcutaneous QHS    insulin lispro  1-4 Units Subcutaneous QHS    insulin lispro  1-8 Units Subcutaneous TID AC    insulin lispro  8 Units Subcutaneous TID AC    petrolatum   Topical Q12H    senna-docusate  2 tablet Oral QHS    sevelamer  800 mg Oral BID Meals    vancomycin   Intravenous See Admin Instructions       acetaminophen **OR** acetaminophen, albuterol-ipratropium, dextrose **OR** dextrose **OR** dextrose **OR** glucagon (rDNA), hydrALAZINE, lactulose, magnesium sulfate, melatonin, morphine, naloxone, ondansetron **OR** ondansetron, oxyCODONE-acetaminophen, polyethylene glycol    Physical Exam:   BP 157/69   Pulse 65   Temp 98.2 F (36.8 C) (Axillary)  Resp 20   Ht 1.575 m (5\' 2" )   Wt 103 kg (227 lb 1.2 oz)   LMP  (LMP Unknown) Comment: post menopausal  SpO2 99%   BMI 41.53 kg/m     Intake and Output Summary (Last 24 hours) at Date Time    Intake/Output Summary (Last 24 hours) at 04/28/2021 9476  Last data filed at 04/27/2021 1830  Gross per 24 hour   Intake --   Output 4600 ml   Net -4600 ml         General: Awake alert oriented x3  Obese : BMI 44  Neck: Supple no JVD  ENT: PERRLA , EOMI   Lungs: Coarse breath sounds no wheezing  Heart: Regular rate and rhythm no gallop  Abdomen: Soft nontender bowel sounds positive  Extremities: Right great toenail removed.  Bilateral lower extremity edema  Neuro: Grossly intact    Labs:      CBC w/Diff CMP   Recent Labs   Lab 04/27/21  0542 04/24/21  0053 04/24/21  0002 04/23/21  1352   WBC 7.37 6.15 6.44 6.41   Hgb 10.7* 11.3* 10.9* 11.1*   Hematocrit 35.5 37.4 35.6 36.2   Platelets 106* 98* 94* 103*   MCV 95.9 94.7 94.2 94.0   Neutrophils  --  79.6 75.3 70.5         PT/INR         Recent Labs   Lab 04/27/21  0542 04/25/21  2246 04/24/21  2119 04/24/21  1223 04/24/21  0828 04/24/21  0053 04/23/21  1352   Sodium 138  --  136 137  More results in Results Review 135 136   Potassium 4.3  --  5.4* 6.3*  More results in Results Review 5.8* 5.8*   Chloride 104  --  98* 101  More results in Results Review 100 99   CO2 24  --  22 23  More results in Results Review 23 23   BUN 54.0*  --  46.0* 65.0*  More results in Results Review 60.0* 57.0*   Creatinine 5.7*  --  6.5* 8.5*  More results in Results Review 8.2* 7.6*   Glucose 189* 381* 468* 381*  More results in Results Review 327* 320*   Calcium 7.5*  --  8.6 8.4*  More results in Results Review 8.4* 8.4*   Protein, Total  --   --   --   --   --  6.4 6.5   Albumin  --   --   --   --   --  2.6* 2.7*   AST (SGOT)  --   --   --   --   --  24 20   ALT  --   --   --   --   --  32 31   Alkaline Phosphatase  --   --   --   --   --  297* 279*   Bilirubin, Total  --   --   --   --   --  0.3 0.6   More results in Results Review = values in this interval not displayed.        Glucose POCT   Recent Labs   Lab 04/27/21  0542 04/25/21  2246 04/24/21  2119 04/24/21  1223 04/24/21  0828 04/24/21  0053 04/23/21  1352   Glucose 189* 381* 468* 381* 367* 327* 320*          Recent Labs  Lab 04/25/21  2246 04/25/21  1820 04/25/21  0356   hs Troponin-I 72.2* 59.2* 49.8*         Rads:   XR Ribs Left W PA Chest    Result Date: 04/27/2021  1. No plain film evidence of fracture or osseous destruction. 2. New interstitial nodular infiltrates in the mid and lower lung field which could represent infection or atypical congestive heart failure. No pleural effusion Jennell Corner, MD  04/27/2021 9:11 AM       Sloan Takagi Felipe Drone, MD  04/28/2021    *This note was generated by the Epic EMR system/ Dragon speech recognition and may contain inherent errors or omissions not intended by the user. Grammatical errors, random word insertions, deletions, pronoun errors and incomplete sentences are occasional consequences of this technology due to software limitations. Not all errors are caught or corrected. If there are questions or concerns about the content of this note or information contained within the body of this dictation they should be addressed directly with the author for clarification

## 2021-04-28 NOTE — Progress Notes (Signed)
PULM. CCM PROGRESS NOTE    Date Time: 04/28/21 12:11 PM  Patient Name: [name],[name]      Assessment:     .  Subcentimeter pulmonary nodules  .  End-stage renal disease  .  Sleep apnea  .  Diabetes mellitus  .  Hyperlipidemia  .  Hypertension    Plan:     .  Subcentimeter pulmonary nodules, follow-up CT as an outpatient  .  On hemodialysis  .  Continue nocturnal CPAP  .  On Lantus insulin 40 units at bedtime, also on sliding scale, follow blood sugar  .  On Lipitor at 40 mg daily  .  Stable blood pressure, continue carvedilol and amlodipine    Subjective:   Patient is a 66 y.o. female who is awake, alert, and comfortable.     Medications:     Current Facility-Administered Medications   Medication Dose Route Frequency    amLODIPine  10 mg Oral Daily    atorvastatin  40 mg Oral Daily    carvedilol  25 mg Oral Q12H SCH    cefTRIAXone  1 g Intravenous Q24H    dextrose  50 g Intravenous Once    gabapentin  100 mg Oral Q8H [place]    heparin (porcine)  5,000 Units Subcutaneous Q12H [place]    insulin glargine  40 Units Subcutaneous QHS    insulin lispro  1-4 Units Subcutaneous QHS    insulin lispro  1-8 Units Subcutaneous TID AC    insulin lispro  8 Units Subcutaneous TID AC    petrolatum   Topical Q12H    senna-docusate  2 tablet Oral QHS    sevelamer  800 mg Oral BID Meals    vancomycin   Intravenous See Admin Instructions       Review of Systems:     General ROS: negative for - chills, fever or night sweats  Ophthalmic ROS: negative for - double vision, excessive tearing, itchy eyes or photophobia  ENT ROS: negative for - headaches, nasal congestion, sore throat or vertigo  Respiratory ROS: negative for - cough, hemoptysis, orthopnea, pleuritic pain, shortness of breath, tachypnea or wheezing  Cardiovascular ROS: negative for - chest pain, edema, orthopnea, rapid heart rate  Gastrointestinal ROS: negative for - abdominal pain, blood in stools, constipation, diarrhea, heartburn or nausea/vomiting  Musculoskeletal ROS:  negative for - muscle pain  Neurological ROS: negative for - confusion, dizziness, headaches, speech problems, visual changes or weakness  Dermatological ROS: negative for dry skin, pruritus and rash    Physical Exam:   BP 143/67   Pulse 65   Temp 98 F (36.7 C) (Axillary)   Resp 12   Ht 1.575 m (5\' 2" )   Wt 103 kg (227 lb 1.2 oz)   LMP  (LMP Unknown) Comment: post menopausal  SpO2 94%   BMI 41.53 kg/m     Intake and Output Summary (Last 24 hours) at Date Time    Intake/Output Summary (Last 24 hours) at 04/28/2021 1211  Last data filed at 04/27/2021 1830  Gross per 24 hour   Intake --   Output 4600 ml   Net -4600 ml       General appearance - alert,  and in no distress  Mental status - alert, oriented to person, place, and time  Eyes - pupils equal and reactive, extraocular eye movements intact  Ears - no external ear lesions seen  Nose - no nasal discharge   Mouth - clear oral mucosa,  moist   Neck - supple, no significant adenopathy  Chest - clear to auscultation, no wheezes, rales or rhonchi, symmetric air entry  Heart - normal rate, regular rhythm, normal S1, S2, no murmurs, rubs, clicks or gallops  Abdomen - soft, nontender, nondistended, no masses or organomegaly  Neurological - alert, oriented, normal speech, no focal findings or movement disorder noted  Musculoskeletal - no joint swelling or tenderness  Extremities -  no pedal edema, no clubbing or cyanosis  Skin - normal coloration, no rashes    Labs:     Recent Labs   Lab 04/28/21  1036 04/27/21  0542 04/24/21  0053 04/24/21  0002 04/23/21  1352   WBC 7.28 7.37 6.15 6.44 6.41   Hgb 11.0* 10.7* 11.3* 10.9* 11.1*   Hematocrit 37.3 35.5 37.4 35.6 36.2   Platelets 112* 106* 98* 94* 103*   MCV 96.9* 95.9 94.7 94.2 94.0   Neutrophils  --   --  79.6 75.3 70.5     Recent Labs   Lab 04/28/21  1036 04/27/21  0542 04/25/21  2246 04/24/21  2119 04/24/21  0828 04/24/21  0053 04/23/21  1352   Sodium 137 138  --  136  More results in Results Review 135 136    Potassium 4.4 4.3  --  5.4*  More results in Results Review 5.8* 5.8*   Chloride 103 104  --  98*  More results in Results Review 100 99   CO2 24 24  --  22  More results in Results Review 23 23   BUN 37.0* 54.0*  --  46.0*  More results in Results Review 60.0* 57.0*   Creatinine 4.7* 5.7*  --  6.5*  More results in Results Review 8.2* 7.6*   Glucose 234* 189* 381* 468*  More results in Results Review 327* 320*   Calcium 8.0* 7.5*  --  8.6  More results in Results Review 8.4* 8.4*   Protein, Total  --   --   --   --   --  6.4 6.5   Albumin  --   --   --   --   --  2.6* 2.7*   AST (SGOT)  --   --   --   --   --  24 20   ALT  --   --   --   --   --  32 31   Alkaline Phosphatase  --   --   --   --   --  297* 279*   Bilirubin, Total  --   --   --   --   --  0.3 0.6   More results in Results Review = values in this interval not displayed.     Glucose:    Recent Labs   Lab 04/28/21  1036 04/27/21  0542 04/25/21  2246 04/24/21  2119 04/24/21  1223 04/24/21  0828 04/24/21  0053   Glucose 234* 189* 381* 468* 381* 367* 327*           Rads:   Radiological Procedure reviewed.  Radiology Results (24 Hour)       ** No results found for the last 24 hours. Inda Castle, MD  Pulmonary and critical care  04/28/21

## 2021-04-28 NOTE — Plan of Care (Addendum)
Alert, pain management in progress. Blood sugar monitoring in progress. Eat 100% of break fast. Lab works from over night shift done by IV team. Safety and comfort maintained. Call light and belongings with in reach.     1400 left the unit for dialysis Tx in stable condition. VSS, see flow sheet    1715 pt returned to unit. Stable, no pain. B/P 138/77. Ultrasound of breast (ordered in the afternoon), pending. Pt stated "tired from the dialysis tx" to get the ultrasound done now. Notified night shift RN of the pending ultrasound. Refused dinner stating "do not like hospital food, same old. My husband will bring dinner". Safety and comfort measures in place.  1900 Report given to night shift.    Problem: Moderate/High Fall Risk Score >5  Goal: Patient will remain free of falls  Flowsheets (Taken 04/28/2021 0831)  VH High Risk (Greater than 13):   ALL REQUIRED LOW INTERVENTIONS   Keep door open for better visibility   A safety companion may be used when deemed appropriate by the Primary RN and Clinical Administrator   Use assistive devices   Use of floor mat   Include family/significant other in multidisciplinary discussion regarding plan of care as appropriate   Use chair-pad alarm device   BED ALARM WILL BE ACTIVATED WHEN THE PATEINT IS IN BED WITH SIGNAGE "RESET BED ALARM"   RED "HIGH FALL RISK" SIGNAGE     Problem: Fluid and Electrolyte Imbalance/ Endocrine  Goal: Fluid and electrolyte balance are achieved/maintained  Flowsheets (Taken 04/28/2021 0831)  Fluid and electrolyte balance are achieved/maintained:   Monitor intake and output every shift   Provide adequate hydration   Assess and reassess fluid and electrolyte status   Observe for seizure activity and initiate seizure precautions if indicated   Monitor for muscle weakness   Assess for confusion/personality changes   Monitor/assess lab values and report abnormal values   Observe for cardiac arrhythmias     Problem: Nutrition  Goal: Nutritional intake is  adequate  Flowsheets (Taken 04/28/2021 0831)  Nutritional intake is adequate:   Monitor daily weights   Encourage/perform oral hygiene as appropriate   Encourage/administer dietary supplements as ordered (i.e. tube feed, TPN, oral, OGT/NGT, supplements)   Consult/collaborate with Clinical Nutritionist   Include patient/patient care companion in decisions related to nutrition   Assess anorexia, appetite, and amount of meal/food tolerated   Consult/collaborate with Speech Therapy (swallow evaluations)   Assist patient with meals/food selection     Problem: Diabetes: Glucose Imbalance  Goal: Blood glucose stable at established goal  Flowsheets (Taken 04/28/2021 0831)  Blood glucose stable at established goal:   Monitor lab values   Follow fluid restrictions/IV/PO parameters   Include patient/family in decisions related to nutrition/dietary selections   Assess for hypoglycemia /hyperglycemia   Coordinate medication administration with meals, as indicated   Ensure appropriate diet and assess tolerance   Monitor intake and output.  Notify LIP if urine output is < 30 mL/hour.   Monitor/assess vital signs   Ensure adequate hydration   Ensure appropriate consults are obtained (Nutrition, Diabetes Education, and Case Management)   Ensure patient/family has adequate teaching materials     Problem: Renal Instability  Goal: Nutritional intake is adequate  Flowsheets (Taken 04/28/2021 0831)  Nutritional intake is adequate:   Monitor daily weights   Encourage/perform oral hygiene as appropriate   Encourage/administer dietary supplements as ordered (i.e. tube feed, TPN, oral, OGT/NGT, supplements)   Consult/collaborate with Clinical Nutritionist   Include  patient/patient care companion in decisions related to nutrition   Assess anorexia, appetite, and amount of meal/food tolerated   Consult/collaborate with Speech Therapy (swallow evaluations)   Assist patient with meals/food selection  Goal: Fluid and electrolyte balance are  achieved/maintained  Flowsheets (Taken 04/28/2021 0831)  Fluid and electrolyte balance are achieved/maintained:   Monitor intake and output every shift   Provide adequate hydration   Assess and reassess fluid and electrolyte status   Observe for seizure activity and initiate seizure precautions if indicated   Monitor for muscle weakness   Follow fluid restrictions/IV/PO parameters   Monitor daily weight   Monitor/assess lab values and report abnormal values     Problem: Safety  Goal: Patient will be free from injury during hospitalization  Flowsheets (Taken 04/28/2021 0831)  Patient will be free from injury during hospitalization:   Assess patient's risk for falls and implement fall prevention plan of care per policy   Provide and maintain safe environment   Ensure appropriate safety devices are available at the bedside   Include patient/ family/ care giver in decisions related to safety   Assess for patients risk for elopement and implement Elopement Risk Plan per policy   Provide alternative method of communication if needed (communication boards, writing)   Use appropriate transfer methods   Hourly rounding     Problem: Compromised Tissue integrity  Goal: Damaged tissue is healing and protected  Flowsheets (Taken 04/28/2021 0831)  Damaged tissue is healing and protected:   Monitor/assess Braden scale every shift   Increase activity as tolerated/progressive mobility   Relieve pressure to bony prominences for patients at moderate and high risk   Keep intact skin clean and dry   Use bath wipes, not soap and water, for daily bathing   Avoid shearing injuries   Encourage use of lotion/moisturizer on skin   Monitor external devices/tubes for correct placement to prevent pressure, friction and shearing   Use incontinence wipes for cleaning urine, stool and caustic drainage. Foley care as needed   Monitor patient's hygiene practices   Consider placing an indwelling catheter if incontinence interferes with healing of stage 3 or  4 pressure injury

## 2021-04-28 NOTE — Progress Notes (Signed)
[facility] PROGRESS NOTE  [id number]      Date Time: 04/28/21 2:21 PM  Patient Name: [name]  Attending Physician: Barbaraann Faster, MD    CC: follow-up ESRD     Assessment:     ESRD on HD MWF at [facility] - followed by Dr. Randel Pigg  Hyperkalemia due to missed HD & hyperglycemia- last HD on 3/29, 4/2  Mild hypocalcemia due to CKD  Anemia in CKD, hgb at goal  HTN - controlled  DM type 2 with hyperglycemia  Hyperphosphatemia: PTH 1579 on 3/23  Secondary hyperparathyroidism  Fall   Abdominal wall abscess MRSA on vancomycin     Recommendations:     UF today   HD tomorrow  Emphasized need for fluid restriction    Case discussed with: RN, pt      Armando Reichert, MD  [facility]  [id number] (office)      Subjective:     C/o chest pain and SOB  Has multiple containers of fluid in front of her    Review of Systems:     No cp  No sob  R Toe pain      Physical Exam:     Vitals:    04/28/21 0716 04/28/21 0952 04/28/21 1142 04/28/21 1415   BP: 151/67 151/70 143/67 120/56   Pulse: 67 67 65 62   Resp: (!) 33  12 (!) 24   Temp: 98.2 F (36.8 C)  98 F (36.7 C) 98 F (36.7 C)   TempSrc: Axillary  Axillary    SpO2: 99%  94% 96%   Weight:       Height:           Intake and Output Summary (Last 24 hours) at Date Time    Intake/Output Summary (Last 24 hours) at 04/28/2021 1421  Last data filed at 04/27/2021 1830  Gross per 24 hour   Intake --   Output 4600 ml   Net -4600 ml         General: awake, alert, oriented x 3, no acute distress  Cardiovascular: regular rate and rhythm, no murmurs, rubs or gallops  Lungs: clear to auscultation bilaterally, without wheezing, rhonchi, or rales  Abdomen: soft, non-tender, non-distended, normoactive bowel sounds  Extremities: no clubbing, cyanosis, or edema      Meds:      Scheduled Meds: PRN Meds:    amLODIPine, 10 mg, Oral, Daily  atorvastatin, 40 mg, Oral, Daily  carvedilol, 25 mg, Oral, Q12H SCH  cefTRIAXone, 1 g, Intravenous, Q24H  dextrose, 50  g, Intravenous, Once  gabapentin, 100 mg, Oral, Q8H SCH  heparin (porcine), 5,000 Units, Subcutaneous, Q12H SCH  insulin glargine, 40 Units, Subcutaneous, QHS  insulin lispro, 1-4 Units, Subcutaneous, QHS  insulin lispro, 1-8 Units, Subcutaneous, TID AC  insulin lispro, 8 Units, Subcutaneous, TID AC  petrolatum, , Topical, Q12H  senna-docusate, 2 tablet, Oral, QHS  sevelamer, 800 mg, Oral, BID Meals  vancomycin, 500 mg, Intravenous, Once  vancomycin, , Intravenous, See Admin Instructions          Continuous Infusions:   acetaminophen, 650 mg, Q6H PRN   Or  acetaminophen, 650 mg, Q6H PRN  albuterol-ipratropium, 3 mL, Q6H PRN  dextrose, 15 g of glucose, PRN   Or  dextrose, 12.5 g, PRN   Or  dextrose, 12.5 g, PRN   Or  glucagon (rDNA), 1 mg, PRN  hydrALAZINE, 10 mg, Q6H PRN  lactulose, 10 g, Daily PRN  magnesium  sulfate, 1 g, PRN  melatonin, 3 mg, QHS PRN  morphine, 2 mg, Q4H PRN  naloxone, 0.2 mg, PRN  ondansetron, 4 mg, Q6H PRN   Or  ondansetron, 4 mg, Q6H PRN  oxyCODONE-acetaminophen, 1 tablet, Q4H PRN  polyethylene glycol, 17 g, Daily PRN              Labs:     Recent Labs   Lab 04/28/21  1036 04/27/21  0542 04/24/21  0053   WBC 7.28 7.37 6.15   Hgb 11.0* 10.7* 11.3*   Hematocrit 37.3 35.5 37.4   Platelets 112* 106* 98*       Recent Labs   Lab 04/28/21  1036 04/27/21  0542 04/25/21  2246 04/24/21  2119 04/24/21  0828 04/24/21  0053 04/23/21  1352   Sodium 137 138  --  136  More results in Results Review 135 136   Potassium 4.4 4.3  --  5.4*  More results in Results Review 5.8* 5.8*   Chloride 103 104  --  98*  More results in Results Review 100 99   CO2 24 24  --  22  More results in Results Review 23 23   BUN 37.0* 54.0*  --  46.0*  More results in Results Review 60.0* 57.0*   Creatinine 4.7* 5.7*  --  6.5*  More results in Results Review 8.2* 7.6*   Calcium 8.0* 7.5*  --  8.6  More results in Results Review 8.4* 8.4*   Albumin  --   --   --   --   --  2.6* 2.7*   Glucose 234* 189* 381* 468*  More results in  Results Review 327* 320*   EGFR 11.2 9.0  --  7.7  More results in Results Review 5.9 6.5   More results in Results Review = values in this interval not displayed.               Invalid input(s): LEUKOCYTESUR        Imaging personally reviewed, including: No results found.        Signed by: Armando Reichert, MD

## 2021-04-28 NOTE — UM Notes (Signed)
[facility] Utilization Review   NPI #[id number], Tax ID [id number]  Please call Milas Gain MSN RN CCM @  [phone]  with any questions or concerns.  Email:  [email or web address]  Fax final authorization and requests for additional information to [phone]    CONCURRENT REVIEW FOR: 04/28/21      PATIENT NAME: [name],[name] / [birth year] / AGE: 66 y.o.      V/S:  Vital Sign Min/Max (last 24 hours)    Value Min Max   Temp 97.8 F (36.6 C) 98.2 F (36.8 C)   Heart Rate 60 70   Resp Rate 12 33 Abnormal    BP: Systolic 358 251   BP: Diastolic 56 80   FiO2 32 % 32 %   SpO2 94 % 100 %       Labs -   Latest Reference Range & Units 04/28/21 10:36   Hemoglobin 11.4 - 14.8 g/dL 11.0 (Young)   Platelet Count 142 - 346 x10 3/uL 112 (Young)   RBC 3.90 - 5.10 x10 6/uL 3.85 (Young)      Latest Reference Range & Units 04/28/21 10:36   Glucose 70 - 100 mg/dL 234 (H)   BUN 7.0 - 21.0 mg/dL 37.0 (H)   Creatinine 0.4 - 1.0 mg/dL 4.7 (H)   Calcium 8.5 - 10.5 mg/dL 8.0 (Young)       MD NOTES:  PLAN  .  Subcentimeter pulmonary nodules, follow-up CT as an outpatient  .  On hemodialysis  .  Continue nocturnal CPAP  .  On Lantus insulin 40 units at bedtime, also on sliding scale, follow blood sugar  .  On Lipitor at 40 mg daily  .  Stable blood pressure, continue carvedilol and amlodipine       Current Medications:    Scheduled Meds:  Current Facility-Administered Medications   Medication Dose Route Frequency    amLODIPine  10 mg Oral Daily    atorvastatin  40 mg Oral Daily    carvedilol  25 mg Oral Q12H SCH    cefTRIAXone  1 g Intravenous Q24H    dextrose  50 g Intravenous Once    gabapentin  100 mg Oral Q8H [place]    heparin (porcine)  5,000 Units Subcutaneous Q12H [place]    insulin glargine  40 Units Subcutaneous QHS    insulin lispro  1-4 Units Subcutaneous QHS    insulin lispro  1-8 Units Subcutaneous TID AC    insulin lispro  8 Units Subcutaneous TID AC    petrolatum   Topical Q12H    senna-docusate  2 tablet Oral QHS     sevelamer  800 mg Oral BID Meals    vancomycin   Intravenous See Admin Instructions     DCP: SNF

## 2021-04-28 NOTE — PT Eval Note (Signed)
Physical Therapy Eval and Treatment  [name]      Post Acute Care Therapy Recommendations   Discharge Recommendations:  SNF  D    If recommended discharge disposition is not available, patient will require the following assistance: [place] aide, Supervision, Assist with OOB activity, Assist with I/ADLs, and HH therapy    DME needs IF patient is discharging home: Other (Comment) (TBD with further OOB assessment)    Therapy discharge recommendations may change with patient status.  Please refer to most recent note for up-to-date recommendations.    Is an Occupational Therapy Evaluation Indicated at this time? This patient is already on OT caseload.    Unit: [place] [facility] INTERMEDIATE CARE  Bed: E0814/G8185-U      ___________________________________________________    Time of Evaluation and Treatment:  Time Calculation  PT Received On: 04/28/21  Start Time: 0959  Stop Time: 1018  Time Calculation (min): 19 min    Evaluation Time: 9 minutes  Treatment Time: 10 minutes    Chart Review and Collaboration with Care Team: 6 minutes, not included in above time    PT Visit Number: 1    Consult received for [name] for PT Evaluation and Treatment.  Patient's medical condition is appropriate for Physical therapy intervention at this time.    Activity Orders:  PT eval and treat    Precautions and Contraindications:  Precautions  Weight Bearing Status: RLE WBAT  Precaution Instructions Given to Patient: Yes  Fall Risks: High, Impaired balance/gait, History of fall(s)  Other Precautions: Contact    Personal Protective Equipment (PPE)  gloves, procedure gown, procedure mask, and shoe covers    Medical Diagnosis:  Altered mental status [R41.82]    History of Present Illness:  MELVINA PANGELINAN is a 66 y.o. female admitted on 04/23/2021 with fall out of the shower. She has a wound on her R heel; per podiatry she is WBAT.       Patient Active Problem List   Diagnosis    Iron deficiency anemia secondary to  inadequate dietary iron intake    Sideropenic dysphagia    Peritoneal dialysis catheter dysfunction, initial encounter    Peritonitis    Upper GI bleed    ESRD (end stage renal disease) on dialysis    Generalized abdominal pain    Thrombocytopenia    h/o orthostatic hypotension    Type 2 diabetes mellitus, with long-term current use of insulin    GERD (gastroesophageal reflux disease)    Bowel incontinence    Right sided numbness    Uncontrolled type 2 diabetes mellitus with hyperglycemia    Hypertension    Hyperlipidemia    History of COPD    Gastritis with hemorrhage    History of anemia due to chronic kidney disease    Open wound, abdominal wall, lateral, subsequent encounter    Gout    Dizziness    Fall    History of CVA (cerebrovascular accident)    Pneumonia due to COVID-19 virus    Diarrhea    Hypertensive urgency    Shortness of breath    ESRD (end stage renal disease)    Hyperkalemia    Pulmonary edema    Hypervolemia    Elevated troponin    Chest pain    Nausea vomiting and diarrhea    Breast pain, left    Syncope    Hypertensive emergency    Volume overload    Altered mental status  Past Medical/Surgical History:  Past Medical History:   Diagnosis Date    Chronic obstructive pulmonary disease     Diabetes mellitus     Hyperlipidemia     Hypertension     Sleep apnea 2018     Past Surgical History:   Procedure Laterality Date    EGD, BIOPSY N/A 05/14/2019    Procedure: EGD, BIOPSY;  Surgeon: Ronie Spies, MD;  Location: [facility];  Service: Gastroenterology;  Laterality: N/A;    EXPLORATORY LAPAROTOMY N/A 05/27/2019    Procedure: EXPLORATORY LAPAROTOMY;  Surgeon: Herbert Moors., MD;  Location: [facility];  Service: General;  Laterality: N/A;    JOINT REPLACEMENT      shoulder    JOINT REPLACEMENT      knee    OVARY SURGERY Left     Removal    REMOVAL, FOREIGN BODY N/A 05/27/2019    Procedure: REMOVAL, PERITONEAL DIALYSIS CATHETER;  Surgeon: Herbert Moors., MD;  Location: [facility];  Service: General;  Laterality: N/A;    TUNNELED CATH CHECK/CHANGE (PERMCATH) N/A 07/03/2019    Procedure: TUNNELED CATH CHECK/CHANGE;  Surgeon: Maureen Ralphs, MD;  Location: [facility];  Service: Interventional Radiology;  Laterality: N/A;    TUNNELED CATH PLACEMENT (PERMCATH) Bilateral 05/23/2019    Procedure: TUNNELED CATH PLACEMENT;  Surgeon: Jacques Earthly, MD;  Location: [facility];  Service: Interventional Radiology;  Laterality: Bilateral;    TUNNELED CATH PLACEMENT (PERMCATH) Right 07/31/2019    Procedure: TUNNELED CATH PLACEMENT;  Surgeon: Jacques Earthly, MD;  Location: [facility];  Service: Interventional Radiology;  Laterality: Right;       X-Rays/Tests/Labs:  Lab Results   Component Value Date/Time    HGB 11.0 (L) 04/28/2021 10:36 AM    HCT 37.3 04/28/2021 10:36 AM    K 4.4 04/28/2021 10:36 AM    NA 137 04/28/2021 10:36 AM    INR 1.2 (H) 03/04/2021 01:23 AM    TROPI 72.2 (AA) 04/25/2021 10:46 PM    TROPI 59.2 (A) 04/25/2021 06:20 PM    TROPI 49.8 (A) 04/25/2021 03:56 AM    TROPI 58.6 (A) 04/24/2021 06:34 PM    TROPI 0.01 03/18/2006 06:10 PM       All imaging reviewed, please see chart for details.    Social History:  Prior Level of Function  Prior level of function: Ambulates with assistive device, Independent with ADLs  Assistive Device: Front wheel walker  Baseline Activity Level: Household ambulation  Driving: does not drive  Cooking: No  DME Currently at Home: ADL- Civil engineer, contracting, Environmental consultant, [employer], ADL- Grab Bars, Home O2 (uses 2 L/min at home)    Home Living Arrangements  Living Arrangements: Children  Type of Home: Apartment  Home Layout: One level  Bathroom Shower/Tub: Administrator, Civil Service: Soil scientist: Grab bars in shower, Public house manager: Accessible, Accessible via walker  DME Currently at Home: ADL- Civil engineer, contracting, Environmental consultant, [employer], ADL- Grab Bars, Home O2 (uses 2 L/min at home)  Home Living - Notes / Comments: pt's son assists  with IADLs and ADLs as needed      Subjective:  Patient is agreeable to participation in the therapy session. Nursing clears patient for therapy.     Pain Assessment  Pain Assessment: Numeric Scale (0-10)  Pain Score: 6-moderate pain  POSS Score: Awake and Alert  Pain Location: Chest  Pain Orientation: Left;Lower  Pain Descriptors: Aching;Discomfort  Pain Frequency: Constant/continuous;Increases  with movement  Effect of Pain on Daily Activities: moderate  Pain Intervention(s): Repositioned;Relaxation technique  Multiple Pain Sites: No      Objective:  Observation of Patient/Vital Signs:    Vitals: supine at rest    04/28/21   BP: 151/70   Pulse: 67   Resp:    Temp:    SpO2: 93% on 4 L/min              Cognitive Status and Neuro Exam:  Cognition/Neuro Status  Arousal/Alertness: Appropriate responses to stimuli  Attention Span: Appears intact  Orientation Level: Oriented X4  Memory: Appears intact  Following Commands: Follows all commands and directions without difficulty  Safety Awareness: minimal verbal instruction  Insights: Fully aware of deficits;Educated in safety awareness  Problem Solving: supervision  Behavior: attentive;cooperative;fearful  Motor Planning: intact  Coordination: intact    Musculoskeletal Examination  Gross ROM  Neck/Trunk ROM: within functional limits  Right Upper Extremity ROM: within functional limits  Left Upper Extremity ROM: within functional limits  Right Lower Extremity ROM: within functional limits  Left Lower Extremity ROM: unable to assess (2/2 pain w/ mvmt from HD fistula)  Gross Strength  Right Upper Extremity Strength: 3+/5  Left Upper Extremity Strength: 3/5  Right Lower Extremity Strength: 4-/5  Left Lower Extremity Strength: 4-/5       Functional Mobility:  Functional Mobility  Supine to Sit: Increased Time;Increased Effort;using bedrail;HOB raised;to Right (for assist w/ trunk and BLEs)  Scooting to HOB: Moderate Assist;Increased Time;Increased Effort (to reposition supine at  [facility])  Scooting to EOB: Minimal Assist;Increased Time;Increased Effort;using bedrail;to Right  Sit to Supine: Moderate Assist (for assist with trunk & BLEs)  Sit to Stand: Unable to assess (Comment) (2/2 pt fatigue, pain and safety)  Transfers  Bed to Chair: Unable to assess (Comment) (2/2 pt fatigue, pain and safety)  Locomotion  Ambulation: Unable to assess (Comment) (2/2 pt fatigue, pain and safety)     Balance  Balance  Sitting - Static: Good  Sitting - Dynamic: Fair  Standing - Static: Not tested  Standing - Dynamic: Not tested    Participation and Activity Tolerance  Participation and Endurance  Participation Effort: fair  Endurance: Tolerates < 10 min exercise, no significant change in vital signs  [place] Dyspnea Scale: 0 Dyspnea    Educated the Patient to role of physical therapy, plan of care, goals of therapy and safety with mobility and ADLs, energy conservation techniques, pursed lip breathing with verbalized understanding  and demonstrated understanding.    Patient left in bed with alarm and all other medical equipment in place and call bell and all personal items/needs within reach.  RN notified of session outcome. CM team and attending have been previously notified of d/c recommendation; no updates to d/c recommendation since that time.      Assessment:  SHAUNTAE REITMAN is a 66 y.o. female admitted 04/23/2021.  PT Assessment  Assessment: Decreased balance;Decreased functional mobility;Decreased LE strength;Decreased UE strength;Decreased safety/judgement during functional mobility;Decreased UE ROM;Decreased endurance/activity tolerance  Prognosis: Fair;With continued PT status post acute discharge;Ongoing PT assessment needed  Progress: Slow progress, decreased activity tolerance      Treatment:  Patient engaged in bed mobility and was able to safely transfer to EOB with facilitation provided for safety/technique. She required increased time with all bed mobility and transfer 2/2 inc pain.  She was only able to tolerate sitting for ~2-3 mins, with fair trunk control, before wanting to return back to bed  2/2 fatigue and pain. Pt was assisted back to bed and educated on proper positioning to help maximize comfort.  SpO2 with activity ranged b/w 91-93% on 4 L/min. Reviewed PT plan of care and D/C recommendation. Reinforced  use of nursing assistance for safety and use of call bell. Pt verbalized understanding and agreement with plan.  Addressed all pt questions and concerns.        Plan:  Treatment/Interventions: Continued evaluation, Bed mobility, Compensatory technique education, Equipment eval/education, Patient/family training, Endurance training, LE strengthening/ROM, Functional transfer training, Neuromuscular re-education, Gait training, Exercise  PT Frequency: 2-3x/wk  Risks/Benefits/POC Discussed with Pt/Family: With patient    PMP Activity: Step 4 - Dangle at Bedside  Distance Walked (ft) (Step 6,7): 0 Feet      Goals:  Goals  Goal Formulation: With patient  Time for Goal Acheivement: By time of discharge  Goals: Select goal  Pt Will Go Supine To Sit: with stand by assist, to maximize functional mobility and independence  Pt Will Perform Sit To Supine: with stand by assist, to maximize functional mobility and independence  Pt Will Perform Sit to Stand: with contact guard assist, to maximize functional mobility and independence (using RW)  Pt Will Transfer Bed/Chair: with rolling walker, with contact guard assist, to maximize functional mobility and independence    Lenord Fellers, [place], DPT  Physical Therapist  Physical Medicine & Rehabilitation  [phone]  Mon-Fri 6:30-3pm  04/28/2021 12:10 PM               [facility]  Patient: JHANA GIARRATANO MRN#: [id number]  Unit: [place] [facility] INTERMEDIATE CARE Bed: Q6761/P5093-O

## 2021-04-28 NOTE — Progress Notes (Signed)
Progress Note- Pharmacy Vancomycin Dosing Consult:  Day 5  Vancomycin Indication: MRSA Abdominal Wall Infection  Vancomycin Monitoring Goal: Pre-HD random level of 15-25 mcg/mL    Imaging:  4/1 CT (A/P): Scarring in the left paramedian abdominal wall with a 1 cm area of low attenuation in the subcutaneous tissues which may represent residual fluid. A smaller than on prior exam.    Pertinent Cultures:    Date Source  Organism & Pertinent Susceptibilities     3/23 Wound cx x2 (Abdominal wall)  MRSA                  As appropriate, contact physician to consider change in therapy if cultures grow organism other than MRSA     Serum creatinine: 4.7 mg/dL (H) 04/28/21 1036  Estimated creatinine clearance: 13.3 mL/min (A)    Baseline SCr: ESRD on HD Mon, Wed, Fri   Nephrotoxic drugs administered in the past 48 hours: Advanced Age (>65Y)    Assessment   Measured Vancomycin Level: Pre-HD random level = 21.9 (therapeutic).   Recent Labs   Lab 04/28/21  1036 04/27/21  0542 04/25/21  0356 04/24/21  1401   Vancomycin Random 21.9 18.1 26.4 20.4     Patient is on dialysis MWF, but is currently having another session today.       Recommendations:  Re-dose vancomycin (~5 mg/kg) = 500 mg IV x once after HD session today.   New Vancomycin level: Pre-HD random level ordered on Friday, 4/7 with AM labs.     Thank you,  Johann Capers, [facility]

## 2021-04-28 NOTE — Progress Notes (Signed)
Hd tx started via L AVF, working well, pt co of pain on her rib cage, VS WDL, awake,alert, drowsy, Report recevied from [place].   04/28/21 1415   Bedside Nurse Communication   Name of bedside RN - pre dialysis Woinshet Mamo RN   Treatment Initiation- With Dialysis Precautions   Time Out/Safety Check Completed Yes   Consent for HD signed for this hospitalization (Date) 03/04/21   Consent for HD signed for this hospitalization (Time) 1540   Preferred language No   Blood Consent Verified N/A   Dialysis Precautions All Connections Secured   Dialysis Treatment Type Routine   Is patient diabetic? Yes   RO/Hemodialysis Architectural technologist   Is Total Chlorine less than 0.1 ppm? Yes   Orignial Total Chlorine Testing Time 1330   At 4 Hour Total Chlorine Testing Time 1730   RO/Hemodialysis Facilities manager Number [id number]    Machine Serial Number [id number]   RO # [id number]   RO Serial # [id number]   Water Hardness NA   pH 7.2   Pressure Test Verified Yes   Alarms Verified Passed   Machine Temperature 97.7 F (36.5 C)   Alarms Verified Yes   Na+ mEq (Machine) 138 mEq   Bicarb mEq (Machine) 35 mEq   Hemodialysis Conductivity (Machine) 13.6   Hemodialysis Conductivity (Meter) 13.7   Dialyzer Lot Number [id number]   Tubing Lot Number [id number]   RO Machine Log Completed Yes   Hepatitis Status   HBsAg (Antigen) Result Negative   HBsAg Date Drawn 04/13/21   HBsAg Repeat Draw Due Date 05/11/21   Dialysis Weight   Pre-Treatment Weight (Kg) 103   Scale Type ICU Bed Scale   Vitals   Temp 98 F (36.7 C)   Heart Rate 62   Resp Rate (!) 24   BP 120/56   SpO2 96 %   Assessment   Mental Status Alert;Oriented;Cooperative   Cardiac (WDL) WDL   Cardiac Regularity Regular   Cardiac Symptoms None   Cardiac Rhythm Normal Sinus Rhythm   Respiratory  WDL   Respiratory Pattern Regular;Dyspnea with exertion   Bilateral Breath Sounds Diminished   Cough Spontaneous   Edema  WDL   Generalized Edema Non Pitting Edema   RLE Edema +1   LLE Edema +1    General Skin Color Appropriate for ethnicity   Skin Condition/Temp Warm;Dry   Gastrointestinal (WDL) WDL   Abdomen Inspection Rounded   GI Symptoms None   Mobility Bed   Graft/Fistula Left   No placement date or time found.   Access Type: Arteriovenous Fistula  Orientation: Left  Graft/Fistula Location: Upper Arm   Fistula/ Graft Assessment Abnormalities WDL   Needle Size 15 Gauge   Pain Assessment   Charting Type Assessment   Pain Scale Used Numeric Scale (0-10)   Numeric Pain Scale   Pain Score 8   POSS Score 1   Pain Location Rib cage   Pain Orientation Right   Pain Descriptors Aching   Pain Frequency Continuous;Increases with movement   Effect of Pain on Daily Activities moderate   Patient's Stated Comfort Functional Goal 0   Pain Intervention(s) Medication   Multiple Pain Sites No   Hemodialysis Comments   Pre-Hemodialysis Comments timeout & safety checks done

## 2021-04-28 NOTE — Progress Notes (Signed)
UF treatment done x2.5hrs, 3.4L net fluid removed, tolerated tx well,  pt is drowsy but easily awaken, homeostasis achieved, Report given to [facility] RN.   04/28/21 1700   Treatment Summary   Time Off Machine 1700   Duration of Treatment (Hours) 2.5   Treatment Type 1:1   Dialyzer Clearance Moderately streaked   Fluid Volume Off (mL) 3800   Prime Volume (mL) 200   Rinseback Volume (mL) 200   Fluid Given: Normal Saline (mL) 0   Fluid Given: PRBC  0 mL   Fluid Given: Albumin (mL) 0   Fluid Given: Other (mL) 0   Total Fluid Given 400   Hemodialysis Net Fluid Removed 3400   Post Treatment Assessment   Post-Treatment Weight (Kg) -3.4   Patient Response to Treatment TOLERATED WELL   Additional Dialyzer Used 0   Graft/Fistula Left   No placement date or time found.   Access Type: Arteriovenous Fistula  Orientation: Left  Graft/Fistula Location: Upper Arm   Fistula/ Graft Assessment Abnormalities WDL   Needle Size 15 Gauge   Cannulation Sites held Arterial (min) 5   Cannulation Sites held Venous (min) 5   Hemostasis Achieved Yes   Vitals   Temp 98 F (36.7 C)   Heart Rate 66   Resp Rate 15   BP 131/59   O2 Device None (Room air)   Assessment   Mental Status Alert;Oriented;Cooperative   Cardiac (WDL) WDL   Cardiac Regularity Regular   Cardiac Symptoms None   Cardiac Rhythm Normal Sinus Rhythm   Respiratory  WDL   Respiratory Pattern Regular   Bilateral Breath Sounds Diminished   Cough Spontaneous   Edema  WDL   Generalized Edema Non Pitting Edema   RLE Edema +1   LLE Edema +1   General Skin Color Appropriate for ethnicity   Skin Condition/Temp Warm;Dry   Gastrointestinal (WDL) WDL   Abdomen Inspection Rounded   GI Symptoms None   Mobility Bed   Pain Assessment   Charting Type Reassessment   Pain Scale Used Numeric Scale (0-10)   Numeric Pain Scale   Pain Score 2   POSS Score 2   Multiple Pain Sites No   Education   Person taught Patient   Knowledge basis Substantial   Topics taught Access care;Fluid Management   Teaching  Tools Explain   Reponse Verbalizes Understanding   Bedside Nurse Communication   Name of bedside RN - post dialysis Woinshet Mamo RN

## 2021-04-28 NOTE — Consults (Signed)
INITIAL HEMATOLOGY CONSULT     Dr. Barbaraann Faster     Visit Date: 04/28/21 7:13 AM    CC: Thrombocytopenia/ Anemia /Breast skin changes     ASSESSMENT & PLAN     Peau'D orange type skin changes of the left breast without overt masses, erythema-  likely dependent edema -will request breast ultrasound to clarify.  Acute on Chronic Thrombocytopenia, likely ITP- post infectious exacerbation  Chronic Normocytic Anemia, due to CKD -   Subcentimeter pulmonary nodule  Acute encephalopathy, status post RRT( chest compression x 1 ) , found with hypoxia, head CT negative.  Status post mechanical fall , slipped in bathroom injury to lower lip, chin.  Injury to left chest wall, edema, X Ray Neg for Fx, right great toe from fall  Recent abdominal wall abscess MRSA at PD catheter site on Vanco at dialysis.  Hyperkalemia.  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  Primary consideration is dependent edema of the skin of the breast.  Low index of suspicion for infiltrating breast cancer or inflammatory breast cancer.  Platelets are above threshold of intervention  Continue iron repletion with dialysis and ESA therapy  Continue Local Wound Care  Continue home meds for comorbid conditions      Cindi Carbon, MD    04/28/2021  7:13 AM       HISTORY OF PRESENT ILLNESS     [name] is 66 y.o. female  with PMHx of hypertension, hyperlipidemia, diabetes, OSA not compliant with CPAP, ESRD-DD.    Remote history  Dec 27, Jan 22, 2020: Patient was admitted in  hospital with shortness of breath and wrist pain. Lab demonstrate WBC 4.02, hemoglobin 7.8, hematocrit 26.8, MCV 100.0, platelet 135, Creatinine 5.7, calcium 8.1. She had evidence of fluid overload on imaging studies. She was seen by nephrology group Dr. Elisabeth Cara and associates and underwent hemodialysis and supplemental oxygen. She had missed dialysis a couple of times which resulted in her admission to the hospital.    Sep 28 to Oct 25, 2020: Patient was admitted in hospital with fall and left hip pain. Patient reports that she fell yesterday while trying to get out of bed. She struck the left side of her body and her head but did not lose consciousness. She was able to get herself up and walk despite experiencing constant sharp left hip pain shoulder pain. She also noticed puffiness of her face and increased shortness of breath since this morning. At the ED her BP was elevated at 200/83. Review of blood work was significant for H/H of 9.8/31.2, platelets 65, troponin 0.42, BNP 2878, fingersticks 476. EKG shows NSR with PAC and mild ST depression in V5 and V6. X-ray of the left hip showed no radiographically apparent displaced fracture is present. Chest x-ray chest showed no significant change of diffuse interstitial markings suggestive of pulmonary vascular congestion. Seen by Dr. Gaylyn Lambert (Nephrology) for ESRD on HD MWF at [facility]- reported last HD on Wednesday. Mild hyperkalemia. Metabolic acidosis due to ESRD. Anemia of CKD. Plan  for HD again. Hold off on EPO as BP very high. Seen by Dr. Lucious Groves (Physician) for pulmonary edema, elevated troponin, severe left wrist pain, thrombocytopenia. Bilateral lower extremity edema. Recommended for CT scan of the left wrist and forearm. Ultrasound Doppler. Add lantus 20 units and lispro 5units.  Sep 29 to Oct 25, 2020: Seen by me for Chronic Microcytic Anemia likely due to CKD vs endocrinopathy with superimposed acute inflammation - improving.  Thrombocytopenia (persistently dropping since 07/2019), likely ITP- improving. ESRD on HD MWF at [facility]. Peripheral neuropathy. Diabetes mellitus- uncontrolled.    Jan 13 to Feb 08, 2021: Patient was admitted in hospital with dyspnea and hypertensive emergency. She had missed her dialysis and developed dyspnea over the preceding 2 days and presented to the emergency room on presentation she was found to have markedly elevated blood pressure readings consistent with hypertensive emergency as well as fluid overload on x-ray. Patient was seen in consultation by nephrology and cardiology she underwent dialysis and ultrafiltration for 4 conservative days before she returned close to her dry weight. Course was also complicated by cough productive of sputum she was treated with IV antibiotics beta agonist and anticholinergics by nebulizer supplemental oxygen and mucolytic agents.    Feb 09 to Mar 06, 2021: Patient was admitted in hospital with shortness of breath for 1 week. Patient had missed dialysis for almost 1 week. Patient had hyperkalemia elevated blood pressure blood sugar and was admitted to intensive care unit for urgent dialysis and immediate care in higher level in hospital. Patient had dialysis fluid balance was improved oxygenation was improved hyperkalemia was improved she was resumed on home medication and was given as needed medicines for elevated blood pressure. She was started on sliding scale coverage, Lantus. She was initially monitored in ICU subsequently transferred to medical floor patient had been having her dialysis per schedule. Her fluid overloading improved oxygenation improved currently she is off oxygen electrolytes got corrected.    March 22 to April 19, 2021: Patient was admitted in hospital with bleeding and drainage from the abdominal wall noted since morning. She has been missing dialysis for 9 days stated that she was getting chickenpox which was healing although patient does  not have any skin lesions. She was complaining of shortness of breath patient is on home oxygen 2 L via nasal cannula. Work-up in emergency room showed elevated phosphorus 6.8 blood sugar was elevated 452, labs showed blood sugar 523 BUN 69 creatinine 10.2 sodium 134 alkaline phosphatase 229. She had an anion gap 16. CBC shows normal white cell count H&H is 12.2/38.7, platelet count 90. Hep C and B were non-reactive. CT abdomen and pelvis showed focal 3.5 x 3.2 x 3.2 cm fluid collection with surrounding fat stranding and a focus of gas extending from the abdominal wall to the skin surface, likely the site of the prior peritoneal dialysis catheter. Findings likely represent active inflammation or a developing abscess. Extensive diffuse anasarca. Mild abdominal ascites. Trace bilateral pleural effusions. Nonspecific left inguinal lymphadenopathy, possibly reactive. Cholelithiasis. Small hiatal hernia. Leiomyomatous uterus. Cardiomegaly. Nephrology consult for ESRD on dialysis, peritoneal dialysis catheter dysfunction, Upper GI bleed. Recommended continue HD. Resume sevelamer 800mg  TIB. Infectious disease consult for abdominal wall abscess. Continue vancomycin and zosyn. General surgery consult for PD catheter site abscess.    March 23 to April 19, 2021: Seen by me for acute on Chronic Thrombocytopenia, likely ITP- post infectious exacerbation. Chronic Normocytic Anemia, due to CKD - improving. ESRD on HD MWF,  Missed HD for >1 week. Cr improving. Abdominal wall abscess at PD catheter site- on Vanco - S/p I&D of abdominal wall abscess on 3/24. Accelerated hypertension due to volume overload in the setting of missed HD. Chronic Volume overload - 2/2 frequently missing HD- improving      Current history    April 24, 2021: Patient was admitted in hospital with altered mental status, and a fall, patient states that she tripped and fell, she is complaining currently of chest discomfort, from the area of the fall and also  abrasion on her lip. Chart review also reveals that the patient was altered mentally, when she was brought over to the hospital. Last night, patient became suddenly altered, and had a near syncope-like event, where she was unresponsive for a minute or 2, after some medications were given she was found to be pretty hypoxic in the 50s. She broke right back up fairly quickly.   Labs demonstrate creatinine 7.6, potassium 5.8, calcium 8.4, albumin 2.7, ALK phos 279. Hemoglobin 11.1, platelet count 103.    CT head showed unremarkable. CT hip right showed no acute fractures or subluxation right hip. CPPD. Anasarca. CT abdomen and pelvis showed no solid or viscus organ injury. No free free air. Trace of free fluid. Anasarca. Scarring in the left paramedian abdominal wall with a 1 cm area of low attenuation in the subcutaneous tissues which may represent residual fluid. CT chest showed groundglass opacities and interlobular septal thickening suggesting mild to moderate pulmonary edema. Bilateral pulmonary nodules. Follow-up as per Fleischner Society protocol.Enlarged left breast with skin thickening and extensive fat stranding with subpectoral, left breast and mediastinal adenopathy. Differential diagnosis includes left breast contusion from trauma versus neoplasm versus mastitis. I would recommend further workup of these findings at a dedicated [facility] with mammograms and ultrasound. Enlarged mediastinal and hilar lymph nodes. Follow-up to exclude any myeloproliferative or neoplastic process. Small hiatal hernia. Cholelithiasis.    Neurology consult for altered mental status, CT head negative. Recommended for repeat CT head.     Nephrology consult for ESRD on HD, hyperkalemia, anemia due to CKD. Recommended for continue hemodialysis.     CT angiogram chest showed negative for acute pulmonary embolism. Worsening aeration of both lungs with increase in bilateral dependent atelectasis. Ongoing septal wall thickening  suggestive of volume overload/pulmonary edema. Likely new trace right pleural effusion. Subtle improvement but significant residual soft tissue stranding and skin thickening in the left chest wall/breast. Unchanged mediastinal, hilar, and left axillary lymphadenopathy.    Pulmonology consult for syncope, obstructive sleep apnea. Recommended for CPAP.     Cardiology consult for chest pain, no EKG changes. Recommended for no further evaluation.     Podiatry consult for right hallux dry eschar and loose toenail. Recommended for noninvasive to assess blood flow.     CVIR consult for PAD and right great toe dry gangrene. 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 No intervention indicated.       Oncology consult was requested due to concerns about skin changes in the left breast and some tenderness.         PAST HISTORY        Primary Care Provider: Horton Marshall, MD        PMH/PSH:    Past Medical History:   Diagnosis Date    Chronic obstructive pulmonary disease     Diabetes mellitus     Hyperlipidemia  Hypertension     Sleep apnea 2018     Past Surgical History:   Procedure Laterality Date    EGD, BIOPSY N/A 05/14/2019    Procedure: EGD, BIOPSY;  Surgeon: Ronie Spies, MD;  Location: [facility];  Service: Gastroenterology;  Laterality: N/A;    EXPLORATORY LAPAROTOMY N/A 05/27/2019    Procedure: EXPLORATORY LAPAROTOMY;  Surgeon: Herbert Moors., MD;  Location: [facility];  Service: General;  Laterality: N/A;    JOINT REPLACEMENT      shoulder    JOINT REPLACEMENT      knee    OVARY SURGERY Left     Removal    REMOVAL, FOREIGN BODY N/A 05/27/2019    Procedure: REMOVAL, PERITONEAL DIALYSIS CATHETER;  Surgeon: Herbert Moors., MD;  Location: [facility];  Service: General;  Laterality: N/A;    TUNNELED CATH CHECK/CHANGE (PERMCATH) N/A 07/03/2019    Procedure: TUNNELED CATH CHECK/CHANGE;  Surgeon: Maureen Ralphs, MD;  Location: [facility];  Service: Interventional Radiology;  Laterality: N/A;    TUNNELED CATH PLACEMENT (PERMCATH) Bilateral 05/23/2019    Procedure: TUNNELED CATH PLACEMENT;  Surgeon: Jacques Earthly, MD;  Location: [facility];  Service: Interventional Radiology;  Laterality: Bilateral;    TUNNELED CATH PLACEMENT (PERMCATH) Right 07/31/2019    Procedure: TUNNELED CATH PLACEMENT;  Surgeon: Jacques Earthly, MD;  Location: [facility];  Service: Interventional Radiology;  Laterality: Right;       Social/Family History:    Family History   Problem Relation Age of Onset    Diabetes Mother     Hypertension Mother     Diabetes Father     Hypertension Father     Diabetes Sister     Hypertension Sister     Diabetes Brother     Hypertension Brother     Diabetes Paternal Aunt     Diabetes Paternal Uncle      Social History     Socioeconomic History    Marital status: Legally Separated   Tobacco Use    Smoking status: Never    Smokeless tobacco: Never   Vaping Use    Vaping status: Never Used   Substance and Sexual Activity    Alcohol use: Never    Drug use: Never    Sexual activity: Not Currently       Allergies: She is allergic to lisinopril and metformin.             OBJECTIVE       Visit Vitals  BP 157/69   Pulse 63   Temp 98.2 F (36.8 C) (Axillary)   Resp 14   Ht 1.575 m (5\' 2" )   Wt 103 kg (227 lb 1.2 oz)   LMP  (LMP Unknown)   SpO2 100%   BMI 41.53 kg/m       Body mass index is 41.53 kg/m.      Intake/Output Summary (Last 24 hours) at 04/28/2021 0713  Last data filed at 04/27/2021 1830  Gross per 24 hour   Intake --   Output 4600 ml   Net -4600 ml        REVIEW OF SYSTEMS     Constitutional: no fever, chills  Eye: no recent visual problems, or pain  ENMT: no ear pain, nasal congestion or sore throat  Respiratory: no shortness of breath or cough  Cardiovascular: Positive for Chest pain, no palpitations or syncope  Gastrointestinal: no Abdominal Pain, nausea, vomiting or diarrhea  Genitourinary:  no frequency,  dysuria, pneumaturia or hematuria  Hema/Lymph: no bruising tendency or swollen lymph glands  Endocrine: no excessive thirst or excessive hunger  Musculoskeletal: no back pain, neck pain, joint pain, or decreased range of motion  Integumentary: no rash, pruritus or abrasions  Neurologic: Positive for weakness, no headache, confusion, numbness, seizures, or gait disturbance  Psychiatric: no anxiety, depression, or suicidal thoughts     PHYSICAL EXAM     General: well nourished, no acute distress.  Skin: warm and dry, no rashes or lesions.  Eye: PERRL, EOMI, normal conjunctiva, vision grossly normal  HENT: normocephalic, atraumatic, normal gross hearing, moist oral mucosa, no scleral icterus, no sinus tenderness, no nasal discharge  Neck: supple, non-tender, no JVD, no lymphadenopathy.  Lungs: Bilateral breath sounds anterior chest, non-labored respiration.  Chest Wall: no deformity .  Bilateral breast examination was done and left breast appears tender to touch with some skin edema and pitting without any overt erythema  or palpable lumps.  Right breast exam was normal.  Heart: normal rate, regular rhythm, no murmur.  Abdomen: soft, non-tender, no guarding or rebound, non-distended, normal bowel sounds, no masses.  Musculoskeletal: Right great toenail removed. no tenderness and swelling  Lymphatic: no edema, no clubbing   Neurologic: alert, and oriented X3, CN normal speech, no focal findings or movement disorder noted.  Psychiatric: cooperative, appropriate mood and affect.     MEDICATIONS      Scheduled Meds: PRN Meds:    amLODIPine, 10 mg, Oral, Daily  atorvastatin, 40 mg, Oral, Daily  carvedilol, 25 mg, Oral, Q12H SCH  cefTRIAXone, 1 g, Intravenous, Q24H  dextrose, 50 g, Intravenous, Once  gabapentin, 100 mg, Oral, Q8H SCH  heparin (porcine), 5,000 Units, Subcutaneous, Q12H SCH  insulin glargine, 40 Units, Subcutaneous, QHS  insulin lispro, 1-4 Units, Subcutaneous, QHS  insulin lispro, 1-8 Units, Subcutaneous, TID  AC  insulin lispro, 8 Units, Subcutaneous, TID AC  petrolatum, , Topical, Q12H  senna-docusate, 2 tablet, Oral, QHS  sevelamer, 800 mg, Oral, BID Meals  vancomycin, , Intravenous, See Admin Instructions         acetaminophen, 650 mg, Q6H PRN   Or  acetaminophen, 650 mg, Q6H PRN  albuterol-ipratropium, 3 mL, Q6H PRN  dextrose, 15 g of glucose, PRN   Or  dextrose, 12.5 g, PRN   Or  dextrose, 12.5 g, PRN   Or  glucagon (rDNA), 1 mg, PRN  hydrALAZINE, 10 mg, Q6H PRN  lactulose, 10 g, Daily PRN  magnesium sulfate, 1 g, PRN  melatonin, 3 mg, QHS PRN  morphine, 2 mg, Q4H PRN  naloxone, 0.2 mg, PRN  ondansetron, 4 mg, Q6H PRN   Or  ondansetron, 4 mg, Q6H PRN  oxyCODONE-acetaminophen, 1 tablet, Q4H PRN  polyethylene glycol, 17 g, Daily PRN           Home Meds:      Home Medications       Med List Status: Complete Set By: Worthy Flank, RN at 04/24/2021  8:50 AM              acetaminophen (TYLENOL) 500 MG tablet     Take 500 mg by mouth every 6 (six) hours as needed for Pain     albuterol sulfate HFA (PROVENTIL) 108 (90 Base) MCG/ACT inhaler     Inhale 2 puffs into the lungs every 6 (six) hours as needed for Wheezing or Shortness of Breath     amLODIPine (NORVASC) 10 MG tablet  Take 1 tablet by mouth daily     aspirin 81 MG chewable tablet     Chew 81 mg by mouth daily     atorvastatin (LIPITOR) 40 MG tablet     Take 1 tablet (40 mg) by mouth daily     carvedilol (COREG) 25 MG tablet     Take 1 tablet (25 mg) by mouth every 12 (twelve) hours     dextromethorphan-guaiFENesin (MUCINEX DM) 30-600 MG per 12 hr tablet     Take 1 tablet by mouth every 12 (twelve) hours     gabapentin (NEURONTIN) 100 MG capsule     Take 1 capsule (100 mg) by mouth every 8 (eight) hours     insulin aspart (NovoLOG) 100 UNIT/ML injection     Inject 15 Units into the skin 3 (three) times daily before meals     Insulin Degludec (TRESIBA SC)     Inject 35 Unit into the skin     lactobacillus/streptococcus (RISAQUAD) Cap     Take 1 capsule by mouth  daily     mupirocin (BACTROBAN) 2 % ointment     Apply topically daily USe BID for 2 Weeks     oxyCODONE-acetaminophen (PERCOCET) 5-325 MG per tablet (Expired)     Take 1 tablet by mouth every 4 (four) hours as needed for Pain     pantoprazole (PROTONIX) 40 MG tablet     Take 40 mg by mouth daily     petrolatum (AQUAPHOR) ointment     Apply topically every 12 (twelve) hours     sevelamer (RENVELA) 800 MG tablet     Take 800 mg by mouth 2 (two) times daily with meals                 I personally reviewed all of the medications       LABS     Recent Labs   Lab 04/27/21  0542 04/25/21  2246 04/24/21  2119 04/24/21  1223 04/24/21  0828 04/24/21  0053 04/23/21  1352   Glucose 189* 381* 468* 381*  More results in Results Review 327* 320*   BUN 54.0*  --  46.0* 65.0*  More results in Results Review 60.0* 57.0*   Creatinine 5.7*  --  6.5* 8.5*  More results in Results Review 8.2* 7.6*   EGFR 9.0  --  7.7 5.7  More results in Results Review 5.9 6.5   Calcium 7.5*  --  8.6 8.4*  More results in Results Review 8.4* 8.4*   Sodium 138  --  136 137  More results in Results Review 135 136   Potassium 4.3  --  5.4* 6.3*  More results in Results Review 5.8* 5.8*   Chloride 104  --  98* 101  More results in Results Review 100 99   CO2 24  --  22 23  More results in Results Review 23 23   Albumin  --   --   --   --   --  2.6* 2.7*   AST (SGOT)  --   --   --   --   --  24 20   ALT  --   --   --   --   --  32 31   Bilirubin, Total  --   --   --   --   --  0.3 0.6   Alkaline Phosphatase  --   --   --   --   --  297* 279*   More results in Results Review = values in this interval not displayed.     Recent Labs   Lab 04/27/21  0542 04/24/21  0053 04/24/21  0002   WBC 7.37 6.15 6.44   Hgb 10.7* 11.3* 10.9*   Hematocrit 35.5 37.4 35.6   MCV 95.9 94.7 94.2   MCH 28.9 28.6 28.8   MCHC 30.1* 30.2* 30.6*   Platelets 106* 98* 94*     Band Neutrophils Absolute   Date Value Ref Range Status   01/05/2020 0.00 0.00 - 1.00 x10 3/uL Final       Iron    Date Value Ref Range Status   02/05/2021 31 (L) 40 - 145 ug/dL Final   10/22/2020 41 40 - 145 ug/dL Final     TIBC   Date Value Ref Range Status   02/05/2021 171 (L) 265 - 497 ug/dL Final   10/22/2020 166 (L) 265 - 497 ug/dL Final     Ferritin   Date Value Ref Range Status   02/05/2021 613.50 (H) 4.60 - 204.00 ng/mL Final   12/31/2019 3,158.40 (H) 4.60 - 204.00 ng/mL Final     Folate   Date Value Ref Range Status   02/05/2021 6.2 See below ng/mL Final     Comment:     Deficient    : <3.5 ng/mL  Intermediate : 3.5 - 5.4 ng/mL  Normal       : Greater Than 5.4 ng/mL     10/22/2020 7.2 See below ng/mL Final     Comment:     Deficient    : <3.5 ng/mL  Intermediate : 3.5 - 5.4 ng/mL  Normal       : Greater Than 5.4 ng/mL       Vitamin B-12   Date Value Ref Range Status   02/05/2021 937 (H) 211 - 911 pg/mL Final   10/22/2020 674 211 - 911 pg/mL Final     Haptoglobin   Date Value Ref Range Status   10/23/2020 225 35 - 250 mg/dL Final     LDH   Date Value Ref Range Status   10/22/2020 114 (L) 120 - 331 U/L Final     Reticulocyte Count Automated   Date Value Ref Range Status   10/23/2020 1.7 0.8 - 2.3 % Final     Reticulocyte Count Absolute   Date Value Ref Range Status   10/23/2020 0.0593 0.0220 - 0.1420 x10 6/uL Final       No results for input(s): PTT, PT, INR in the last 72 hours.        Microbiology:  Microbiology Results (last 15 days)       Procedure Component Value Units Date/Time    COVID-19 (SARS-CoV-2) only ([facility]) asymptomatic admission [[id number]] Collected: 04/23/21 1352    Order Status: Completed Specimen: Nasopharyngeal Updated: 04/23/21 1429     Purpose of COVID testing Screening     SARS-CoV-2 Specimen Source Nasal Swab     SARS CoV 2 Overall Result Not Detected     Comment: __________________________________________________  -A result of "Detected" indicates POSITIVE for the    presence of SARS CoV-2 RNA  -A result of "Not Detected" indicates NEGATIVE for the    presence of SARS CoV-2  RNA  __________________________________________________________  Test performed using the Roche cobas [facility] SARS-CoV-2 assay. This assay is  only for use under the Food and Drug Administration's Emergency Use  Authorization. This is a real-time RT-PCR assay for the  qualitative  detection of SARS-CoV-2 RNA. Viral nucleic acids may persist in vivo,  independent of viability. Detection of viral nucleic acid does not imply the  presence of infectious virus, or that virus nucleic acid is the cause of  clinical symptoms. Negative results do not preclude SARS-CoV-2 infection and  should not be used as the sole basis for diagnosis, treatment or other  patient management decisions. Negative results must be combined with  clinical observations, patient history, and/or epidemiological information.  Invalid results may be due to inhibiting substances in the specimen and  recollection should occur. Please see Fact Sheets for patients and providers  located:  [email or web address]         Narrative:      o Collect and clearly label specimen type:  o PREFERRED-Upper respiratory specimen: One Nasal Swab in  Transport Media.  o Hand deliver to laboratory ASAP  Indication for testing->Extended care facility admission to  semi private room  Screening    Culture + Gram Lenise Arena, Wound [[id number]] Collected: 04/14/21 1609    Order Status: Completed Specimen: Wound Updated: 04/17/21 2031    Narrative:      [id number] called Micro Results of MRSA. Read back [id number], by [id number] on  04/17/2021 at 20:30  ESwab  ORDER#: [id number]                                    ORDERED BY: CHOUDHARY, IMTI  SOURCE: Wound Abdominal wall wound                   COLLECTED:  04/14/21 16:09  ANTIBIOTICS AT COLL.:                                RECEIVED :  04/14/21 22:37  ORDER ENTRY COMMENTS:  ESwab  [id number] called Micro Results of MRSA. Read back [id number], by [phone] on 04/17/2021 at 20:30  Stain, Gram                                FINAL        04/15/21 00:03  04/15/21   No Squamous epithelial cells seen             No WBCs or organisms seen  Culture and Gram Stain, Aerobic, Wound     FINAL       04/17/21 20:31   +  04/15/21   Heavy growth of Staphylococcus aureus               Refer to susceptibilities on culture #[id number]        Culture + Gram Lenise Arena Wound [[id number]] Collected: 04/14/21 1201    Order Status: Completed Specimen: Wound Updated: 04/17/21 1648    Narrative:      Sissy Hoff  ORDER#: [id number]                                    ORDERED BY: Gustavus Messing, IMTI  SOURCE: Wound abdomen                                COLLECTED:  04/14/21 12:01  ANTIBIOTICS AT COLL.:  RECEIVED :  04/14/21 15:22  ORDER ENTRY COMMENTS:  Blue Culturette  Stain, Gram                                FINAL       04/14/21 16:55   +  04/14/21   No Squamous epithelial cells seen             Many WBCs             Moderate Gram positive cocci  Culture and Gram Stain, Aerobic, Wound     FINAL       04/17/21 16:48   +  04/17/21   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      _____________________________________________________________________________                                      MRSA        ANTIBIOTICS                     MIC  INTRP      _____________________________________________________________________________  Clindamycin                    <=0.5   S        Daptomycin                      <=1    S        Erythromycin                    >4     R        Gentamicin                      <=2    S        Levofloxacin                     4     I        Linezolid                        2     S        Oxacillin                       >2     R  D1    Penicillin                      >1     R        Rifampin                       <=0.5   S        Tetracycline                   <=0.5   S        Trimethoprim/Sulfamethoxazole <=0.5/9  S        Vancomycin                       1     S          -----DRUG COMMENTS----------  D1:   Oxacillin sensitivity predicts sensitivity to cephalosporins.         [place] [employer] 2016  _____________________________________________________________________________            S=SUSCEPTIBLE     I=INTERMEDIATE     R=RESISTANT       N/S=NON-SUSCEPTIBLE     SDD=SUSCEPTIBLE-DOSE DEPENDENT  _____________________________________________________________________________      COVID-19 (SARS-CoV-2) and Influenza A/B, NAA (Liat Rapid)- Admission [[id number]] Collected: 04/13/21 1529    Order Status: Completed Specimen: Culturette from Nasopharyngeal Updated: 04/13/21 1615     Purpose of COVID testing Diagnostic -[facility]     SARS-CoV-2 Specimen Source Nasal Swab     SARS CoV 2 Overall Result Not Detected     Comment: __________________________________________________  -A result of "Detected" indicates POSITIVE for the    presence of SARS CoV-2 RNA  -A result of "Not Detected" indicates NEGATIVE for the    presence of SARS CoV-2 RNA  __________________________________________________________  Test performed using the Roche cobas Liat SARS-CoV-2 assay. This assay is  only for use under the Food and Drug Administration's Emergency Use  Authorization. This is a real-time RT-PCR assay for the qualitative  detection of SARS-CoV-2 RNA. Viral nucleic acids may persist in vivo,  independent of viability. Detection of viral nucleic acid does not imply the  presence of infectious virus, or that virus nucleic acid is the cause of  clinical symptoms. Negative results do not preclude SARS-CoV-2 infection and  should not be used as the sole basis for diagnosis, treatment or other  patient management decisions. Negative results must be combined with  clinical observations, patient history, and/or epidemiological information.  Invalid results may be due to inhibiting substances in the specimen and  recollection should occur. Please see Fact Sheets for patients and  providers  located:  https://www.[employer].com/          Influenza A Not Detected     Influenza B Not Detected     Comment: Test performed using the Roche cobas Liat SARS-CoV-2 & Influenza A/B assay.  This assay is only for use under the Food and Drug Administration's  Emergency Use Authorization. This is a multiplex real-time RT-PCR assay  intended for the simultaneous in vitro qualitative detection and  differentiation of SARS-CoV-2, influenza A, and influenza B virus RNA. Viral  nucleic acids may persist in vivo, independent of viability. Detection of  viral nucleic acid does not imply the presence of infectious virus, or that  virus nucleic acid is the cause of clinical symptoms. Negative results do  not preclude SARS-CoV-2, influenza A, and/or influenza B infection and  should not be used as the sole basis for diagnosis, treatment or other  patient management decisions. Negative results must be combined with  clinical observations, patient history, and/or epidemiological information.  Invalid results may be due to inhibiting substances in the specimen and  recollection should occur. Please see Fact Sheets for patients and providers  located: http://[facility].info/.         Narrative:      o Collect and clearly label specimen type:  o PREFERRED-Upper respiratory specimen: One Nasal Swab in  Transport Media.  o Hand deliver to laboratory ASAP  Diagnostic -[facility]I    Culture Blood Aerobic and Anaerobic [[id number]] Collected: 04/13/21 1529    Order Status: Completed Specimen: Arm from Blood, Venipuncture Updated: 04/18/21 2221    Narrative:      ORDER#: [id number]  ORDERED BY: MANOOCHEHRI, OM  SOURCE: Blood, Venipuncture arm                      COLLECTED:  04/13/21 15:29  ANTIBIOTICS AT COLL.:                                RECEIVED :  04/13/21 20:03  Culture Blood Aerobic and Anaerobic        FINAL       04/18/21 22:21  04/18/21   No growth after 5  days of incubation.      Culture Blood Aerobic and Anaerobic [[id number]] Collected: 04/13/21 1529    Order Status: Completed Specimen: Arm from Blood, Venipuncture Updated: 04/18/21 2221    Narrative:      ORDER#: [id number]                                    ORDERED BY: MANOOCHEHRI, OM  SOURCE: Blood, Venipuncture arm                      COLLECTED:  04/13/21 15:29  ANTIBIOTICS AT COLL.:                                RECEIVED :  04/13/21 20:03  Culture Blood Aerobic and Anaerobic        FINAL       04/18/21 22:21  04/18/21   No growth after 5 days of incubation.               IMAGING     Diagnostic Imaging:  XR Ribs Left W PA Chest    Result Date: 04/27/2021  1. No plain film evidence of fracture or osseous destruction. 2. New interstitial nodular infiltrates in the mid and lower lung field which could represent infection or atypical congestive heart failure. No pleural effusion Jennell Corner, MD 04/27/2021 9:11 AM    [place] Noninvas Low Extrem Art Dopp/Press/Wavefrms (Pvr) Comp 3-4 Lvls    Result Date: 04/26/2021   Abnormal physiologic study with evidence of infrainguinal disease bilaterally. There is moderate dampening of the digital waveforms in the right foot. Some pulsatility of the waveforms is present, which suggests the patient may heal her ulceration. However, if she does not heal, she would be a candidate for arteriography and possible revascularization. These findings and plan were discussed with Dr. Maudie Mercury. Clenton Pare, MD 04/26/2021 11:09 AM    US Venous Low Extrem Duplex Dopp Ltd Bilat    Result Date: 04/25/2021   NORMAL VENOUS DUPLEX OF THE LOWER EXTREMITIES. NO EVIDENCE OF INTRALUMINAL THROMBUS OR OBSTRUCTION TO VENOUS FLOW IN RIGHT OR LEFT LOWER EXTREMITY. Nelma Rothman, MD 04/25/2021 1:24 PM    CT Angiogram Chest    Result Date: 04/24/2021  1.Negative for acute pulmonary embolism. 2.Worsening aeration of both lungs with increase in bilateral dependent atelectasis. Ongoing septal wall thickening suggestive of volume  overload/pulmonary edema. Likely new trace right pleural effusion. 3.Subtle improvement but significant residual soft tissue stranding and skin thickening in the left chest wall/breast. 4.Unchanged mediastinal, hilar, and left axillary lymphadenopathy. Nelya Ebadirad 04/24/2021 8:31 PM    CT Head WO Contrast    Result Date: 04/24/2021   No acute intracranial abnormality. Nelya Ebadirad 04/24/2021 6:16 PM  XR Chest AP Portable    Result Date: 04/23/2021   No acute findings. Evaluation slightly limited due to poor penetration. Jeanella Cara, MD 04/23/2021 11:03 PM    CT Hip Right WO Contrast    Result Date: 04/23/2021  No acute fractures or subluxation right hip. CPPD. Anasarca. Macon Large, MD 04/23/2021 5:54 PM    CT Abdomen Pelvis WO Contrast    Result Date: 04/23/2021   No solid or viscus organ injury. No free free air. Trace of free fluid. Anasarca. Scarring in the left paramedian abdominal wall with a 1 cm area of low attenuation in the subcutaneous tissues which may represent residual fluid. A smaller than on prior exam. Abnormal appearing left breast. Please see CT chest report dated 04/23/2021. Cholelithiasis. Small hiatal hernia Diastases abdominis rectus.Macon Large, MD 04/23/2021 5:51 PM    CT Chest without Contrast    Result Date: 04/23/2021  Groundglass opacities and interlobular septal thickening suggesting mild to moderate pulmonary edema. Bilateral pulmonary nodules. Follow-up as per Fleischner Society protocol. Enlarged left breast with skin thickening and extensive fat stranding with subpectoral, left breast and mediastinal adenopathy. Differential diagnosis includes left breast contusion from trauma versus neoplasm versus mastitis. I would recommend further workup of these findings at a dedicated [place] with mammograms and ultrasound. Findings were discussed with Dr. Hartford Poli 5:35 PM 05/23/2021. Enlarged mediastinal and hilar lymph nodes. Follow-up to exclude any  myeloproliferative or neoplastic process. Small hiatal hernia. Cholelithiasis. Fleischner Society pulmonary nodule guidelines (revised 2017): Multiple solid nodules less than 6 mm: Low risk for lung cancer: No follow-up. High risk for lung cancer: Optional chest CT in12 months. Macon Large, MD 04/23/2021 5:38 PM    XR Pelvis Limited 1 Or 2 Views    Result Date: 04/23/2021   No definite fracture is demonstrated. The study is however significantly limited by underpenetration. Additional radiographs or CT are recommended. Steward Drone, MD 04/23/2021 3:54 PM    Ribs Right/PA Chest    Result Date: 04/23/2021   Limited study due to osteopenia and soft tissue density. No fracture seen. Jennell Corner, MD 04/23/2021 3:37 PM    CT Head without Contrast    Result Date: 04/23/2021   Unremarkable noncontrast CT of the brain. Steward Drone, MD 04/23/2021 2:27 PM    XR Chest  AP Portable    Result Date: 04/13/2021   Stable cardiac enlargement without radiographic evidence of acute edema nor pneumonia at this time. Isabelle Course, MD 04/13/2021 4:34 PM    CT Abdomen Pelvis WO IV/ WO PO Cont    Result Date: 04/13/2021  1.Focal 3.5 x 3.2 x 3.2 cm fluid collection with surrounding fat stranding and a focus of gas extending from the abdominal wall to the skin surface, likely the site of the prior peritoneal dialysis catheter. Findings likely represent active inflammation or a developing abscess. Clinical correlation is recommended. 2.Extensive diffuse anasarca. Mild abdominal ascites. Trace bilateral pleural effusions. 3.Nonspecific left inguinal lymphadenopathy, possibly reactive. 4.Cholelithiasis. 5.Small hiatal hernia. 6.Leiomyomatous uterus. 7.Cardiomegaly. Cynda Familia, MD 04/13/2021 4:17 PM

## 2021-04-28 NOTE — Plan of Care (Signed)
Problem: Renal Instability  Goal: Fluid and electrolyte balance are achieved/maintained  Flowsheets (Taken 04/28/2021 0831 by Christena Flake, RN)  Fluid and electrolyte balance are achieved/maintained:   Monitor intake and output every shift   Provide adequate hydration   Assess and reassess fluid and electrolyte status   Observe for seizure activity and initiate seizure precautions if indicated   Monitor for muscle weakness   Follow fluid restrictions/IV/PO parameters   Monitor daily weight   Monitor/assess lab values and report abnormal values  Goal: Free from infection  Flowsheets (Taken 04/27/2021 1438 by Erin Hearing, RN)  Free from infection:   Assess need for indwelling catheter every shift and discuss with LIP   Monitor/assess for signs and symptoms of infection     Problem: Patient Receiving Advanced Renal Therapies  Goal: Therapy access site remains intact  Flowsheets (Taken 04/27/2021 1438 by Erin Hearing, RN)  Therapy access site remains intact:   Change therapy access site dressing as needed   Assess therapy access site

## 2021-04-29 ENCOUNTER — Observation Stay: Payer: 59

## 2021-04-29 LAB — CBC
Absolute NRBC: 0 10*3/uL (ref 0.00–0.00)
Hematocrit: 35.9 % (ref 34.7–43.7)
Hgb: 10.8 g/dL — ABNORMAL LOW (ref 11.4–14.8)
MCH: 28.1 pg (ref 25.1–33.5)
MCHC: 30.1 g/dL — ABNORMAL LOW (ref 31.5–35.8)
MCV: 93.2 fL (ref 78.0–96.0)
MPV: 12.1 fL (ref 8.9–12.5)
Nucleated RBC: 0 /100 WBC (ref 0.0–0.0)
Platelets: 107 10*3/uL — ABNORMAL LOW (ref 142–346)
RBC: 3.85 10*6/uL — ABNORMAL LOW (ref 3.90–5.10)
RDW: 14 % (ref 11–15)
WBC: 8.06 10*3/uL (ref 3.10–9.50)

## 2021-04-29 LAB — BASIC METABOLIC PANEL
Anion Gap: 10 (ref 5.0–15.0)
BUN: 49 mg/dL — ABNORMAL HIGH (ref 7.0–21.0)
CO2: 25 mEq/L (ref 17–29)
Calcium: 8.3 mg/dL — ABNORMAL LOW (ref 8.5–10.5)
Chloride: 102 mEq/L (ref 99–111)
Creatinine: 5.4 mg/dL — ABNORMAL HIGH (ref 0.4–1.0)
Glucose: 152 mg/dL — ABNORMAL HIGH (ref 70–100)
Potassium: 4.5 mEq/L (ref 3.5–5.3)
Sodium: 137 mEq/L (ref 135–145)

## 2021-04-29 LAB — VANCOMYCIN, RANDOM: Vancomycin Random: 25.2 ug/mL

## 2021-04-29 LAB — GLUCOSE WHOLE BLOOD - POCT
Whole Blood Glucose POCT: 225 mg/dL — ABNORMAL HIGH (ref 70–100)
Whole Blood Glucose POCT: 166 mg/dL — ABNORMAL HIGH (ref 70–100)
Whole Blood Glucose POCT: 204 mg/dL — ABNORMAL HIGH (ref 70–100)
Whole Blood Glucose POCT: 318 mg/dL — ABNORMAL HIGH (ref 70–100)

## 2021-04-29 LAB — GFR: EGFR: 9.6

## 2021-04-29 MED ORDER — SODIUM CHLORIDE 0.9 % IV BOLUS
250.0000 mL | INTRAVENOUS | Status: AC | PRN
Start: 2021-04-29 — End: 2021-04-29

## 2021-04-29 MED ORDER — ALBUMIN HUMAN 25 % IV SOLN
100.0000 mL | INTRAVENOUS | Status: AC | PRN
Start: 2021-04-29 — End: 2021-04-29
  Administered 2021-04-29: 25 g via INTRAVENOUS
  Filled 2021-04-29: qty 100

## 2021-04-29 MED ORDER — SODIUM CHLORIDE 0.9 % IV BOLUS
100.0000 mL | INTRAVENOUS | Status: AC | PRN
Start: 2021-04-29 — End: 2021-04-29

## 2021-04-29 NOTE — Progress Notes (Signed)
Pt is alert and oriented. Pt denied pain and sob. No other complain given. Albumin 25% and UF lowered during tx d/t BP soft.  LAVF access is optimally working post dialysis.  No bleeding on access site post tx.   Hemodialysis ended and pt. able to finish 3.5 hrs tx.  Able to removed Net Fluid of 3.8L.  Reports were given to Primary nurse.        04/29/21 1413   Treatment Summary   Time Off Machine 1350   Duration of Treatment (Hours) 3.5   Treatment Type 1:1   Dialyzer Clearance Moderately streaked   Fluid Volume Off (mL) 4300   Prime Volume (mL) 200   Rinseback Volume (mL) 200   Fluid Given: Normal Saline (mL) 0   Fluid Given: PRBC  0 mL   Fluid Given: Albumin (mL) 100   Fluid Given: Other (mL) 0   Total Fluid Given 500   Hemodialysis Net Fluid Removed 3800   Post Treatment Assessment   Post-Treatment Weight (Kg)   (-3.8L.)   Patient Response to Treatment fairly tolerated   Additional Dialyzer Used 0   Graft/Fistula Left   No placement date or time found.   Access Type: Arteriovenous Fistula  Orientation: Left  Graft/Fistula Location: Upper Arm   Fistula/ Graft Assessment Abnormalities WDL   Needle Size 16 Gauge   Vitals   Temp 97.2 F (36.2 C)   Heart Rate 61   Resp Rate 16   BP 119/59   SpO2 100 %   O2 Device Nasal cannula   Assessment   Mental Status Alert;Oriented;Lethargic   Cardiac Regularity Regular   Cardiac Rhythm Normal Sinus Rhythm   Respiratory Pattern Regular   Bilateral Breath Sounds Diminished   R Breath Sounds Diminished   L Breath Sounds Diminished   RLE Edema +1   LLE Edema +1   General Skin Color Appropriate for ethnicity   Skin Condition/Temp Warm;Dry   Abdomen Inspection Rounded   GI Symptoms None   Mobility Bed   Pain Assessment   Charting Type Reassessment   Pain Scale Used Numeric Scale (0-10)   Numeric Pain Scale   Pain Score 0   POSS Score 1   Education   Person taught Patient   Knowledge basis Substantial   Topics taught Procedure   Teaching Tools Explain   Reponse Verbalizes  Understanding   Bedside Nurse Communication   Name of bedside RN - post dialysis Hyerin Cho, RN.

## 2021-04-29 NOTE — Plan of Care (Signed)
Problem: Renal Instability  Goal: Fluid and electrolyte balance are achieved/maintained  Outcome: Progressing  Flowsheets (Taken 04/29/2021 1103)  Fluid and electrolyte balance are achieved/maintained:   Monitor daily weight   Assess for confusion/personality changes   Monitor/assess lab values and report abnormal values   Observe for cardiac arrhythmias  Goal: Free from infection  Outcome: Progressing  Flowsheets (Taken 04/29/2021 1103)  Free from infection: Monitor/assess for signs and symptoms of infection     Problem: Patient Receiving Advanced Renal Therapies  Goal: Therapy access site remains intact  Outcome: Progressing  Flowsheets (Taken 04/29/2021 1103)  Therapy access site remains intact: Assess therapy access site

## 2021-04-29 NOTE — Progress Notes (Signed)
Arrived in pt room, Pt is contact isolation. PPE done per protocol. Pt is alert and oriented.  Pt denied pain and sob. Pt on O2@3L /min. Pt was eating breakfast and wanted to finish it before initiation of tx. LAVF access is optimally working. Timeouts and safety checks done. Will closely monitor the pt.        04/29/21 1015   Bedside Nurse Communication   Name of bedside RN - pre dialysis Hyerim Cho, RN.   Treatment Initiation- With Dialysis Precautions   Time Out/Safety Check Completed Yes   Consent for HD signed for this hospitalization (Date) 02/04/21   Consent for HD signed for this hospitalization (Time) 1450   Blood Consent Verified N/A   Dialysis Precautions All Connections Secured;Saline Line Double Clamped;Venous Parameters Set;Arterial Parameters Set;Air Foam Detecctor Engaged   Dialysis Treatment Type Routine   Special Considerations contact isolation   Is patient diabetic? Yes   RO/Hemodialysis Architectural technologist   Is Total Chlorine less than 0.1 ppm? Yes   Orignial Total Chlorine Testing Time 0920   At 4 Hour Total Chlorine Testing Time 1320   RO/Hemodialysis Facilities manager Number 4    Machine Serial Number [id number]   RO # 21   RO Serial # [id number]   pH 7.2   Pressure Test Verified Yes   Alarms Verified Passed   Machine Temperature 98.6 F (37 C)   Alarms Verified Yes   Na+ mEq (Machine) 138 mEq   Bicarb mEq (Machine) 35 mEq   Hemodialysis Conductivity (Machine) 13.8   Hemodialysis Conductivity (Meter) 13.8   Dialyzer Lot Number [id number]   Tubing Lot Number [id number]   RO Machine Log Completed Yes   Hepatitis Status   HBsAg (Antigen) Result Negative   HBsAg Date Drawn 04/13/21   HBsAg Repeat Draw Due Date 05/11/21   Dialysis Weight   Pre-Treatment Weight (Kg) 100.6   Scale Type ICU Bed Scale   Vitals   Temp 97.3 F (36.3 C)   Heart Rate 73   Resp Rate (!) 24   BP 167/73   SpO2 92 %   O2 Device Nasal cannula   Assessment   Mental Status Alert;Oriented   Cardiac Regularity Regular    Cardiac Rhythm Normal Sinus Rhythm   Respiratory Pattern Regular   Bilateral Breath Sounds Diminished   RLE Edema +1   LLE Edema +1   General Skin Color Appropriate for ethnicity   Skin Condition/Temp Warm;Dry   Abdomen Inspection Rounded   GI Symptoms None   Mobility Bed   Graft/Fistula Left   No placement date or time found.   Access Type: Arteriovenous Fistula  Orientation: Left  Graft/Fistula Location: Upper Arm   Fistula/ Graft Assessment Abnormalities WDL   Pain Assessment   Charting Type Assessment   Pain Scale Used Numeric Scale (0-10)   Numeric Pain Scale   Pain Score 0   POSS Score 1   Hemodialysis Comments   Pre-Hemodialysis Comments Timeout and safety checks done

## 2021-04-29 NOTE — Consults (Signed)
Pharmacy Vancomycin Dosing Consult    Day 6  Vancomycin Indication: MRSA Abdominal Wall Infection  Vancomycin Monitoring Goal: Pre-HD 15-25 mcg/mL  Current Regimen: dose by level      Pertinent Cultures:   Wcx heavy MRSA     Serum creatinine: 5.4 mg/dL (H) 04/29/21 1022  Estimated creatinine clearance: 11.4 mL/min (A)  Baseline Scr ESRD on HD MWF, getting additional sessions PRN  Nephrotoxic agents/conditions: Advanced Age (>65Y)    Measured Vancomycin Level:   Recent Labs   Lab 04/29/21  1022 04/28/21  1036 04/27/21  0542 04/25/21  0356 04/24/21  1401   Vancomycin Random 25.2 21.9 18.1 26.4 20.4       Assessment/Recommendations:   - Pre HD level 25.2 mcg/ml, supraT  - Hold post HD dose today  - next pre HD random dose 05/02/21     Thank you for this consult. Pharmacy will continue to follow this patient's progress with you until consult and/or vancomycin is discontinued.    Joesph Fillers, [place], Pharm.D.  Phone: [phone]

## 2021-04-29 NOTE — Progress Notes (Signed)
PROGRESS NOTE    Date Time: 04/29/21 5:53 PM  Patient Name: [name], [name]  Patient status: [facility]: 0      Assessment:   Reproducible chest pain , left breast swelling status post chest compression x1.  Acute metabolic encephalopathy , s/p  RRT( chest compression x 1 ) , found with hypoxia, head CT negative.  Left breast swelling rib cage x-ray negative, ultrasound with inflammatory changes  Status post mechanical fall , slipped in bathroom injury to lower lip, chin.  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  Hyperkalemia.  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  Patient oriented x3.  Surgery consultation requested for left breast swelling recommended outpatient biopsy with breast surgeon.Dr.Karzai   Vancomycin until 05/27/2021 (MRSA infection)  On Lantus, sliding scale coverage  On amlodipine Coreg, monitor blood pressure.  D/c rocephin nail removed , Vanco with dialysis till 05/27/2021  Dialysis per nephrology  Continue statin.  Monitor H&H and platelet count.  Does Not Use CPAP   Fall precaution, OT PT  DVT prophylaxis  Discharge planning when stable  Labs and data reviewed.  Discussed with patient and nurse  Discussed with Drs. Maury Dus, surgery resident    Subjective:   Patient seen and examined :  Had dialysis today.  Ultrafiltration for tomorrow    Review of Systems:   General ROS:   No fever or chills.  Complain of chest pain no palpitation  dizziness  No shortness of breath, cough, wheezing.  Denies nausea vomiting abdominal pain.  General weakness  Medications:     Current Facility-Administered Medications   Medication Dose Route Frequency    amLODIPine  10 mg Oral Daily    atorvastatin  40 mg Oral Daily    carvedilol  25 mg Oral Q12H SCH    cefTRIAXone  1 g Intravenous Q24H    dextrose  50 g Intravenous Once    gabapentin  100 mg Oral Q8H [place]    heparin (porcine)  5,000 Units Subcutaneous Q12H [place]    insulin glargine  40 Units Subcutaneous QHS    insulin lispro  1-4 Units Subcutaneous QHS    insulin lispro  1-8 Units Subcutaneous TID AC    insulin lispro  8 Units Subcutaneous TID AC    petrolatum   Topical Q12H    senna-docusate  2 tablet Oral QHS    sevelamer  800 mg Oral BID Meals    vancomycin   Intravenous See Admin Instructions       acetaminophen **OR** acetaminophen, albuterol-ipratropium, dextrose **OR** dextrose **OR** dextrose **OR** glucagon (rDNA), hydrALAZINE, lactulose, magnesium sulfate, melatonin, naloxone, ondansetron **OR** ondansetron, oxyCODONE-acetaminophen, polyethylene glycol    Physical Exam:   BP 134/61   Pulse 72   Temp 97.5 F (36.4 C) (Oral)   Resp 18   Ht 1.575 m (5\' 2" )   Wt 101.2 kg (223 lb)   LMP  (LMP  Unknown) Comment: post menopausal  SpO2 98%   BMI 40.79 kg/m     Intake and Output Summary (Last 24 hours) at Date Time    Intake/Output Summary (Last 24 hours) at 04/29/2021 1753  Last data filed at 04/29/2021 1413  Gross per 24 hour   Intake --   Output 3800 ml   Net -3800 ml       General: Awake alert oriented x3.  Neck: Supple no JVD  ENT: PERRLA, EOMI  Lungs: Clear to auscultation no crackles no wheezing  Heart: Regular rate and rhythm no gallop  Abdomen: Soft nontender bowel sounds positive  Extremities: Warm moist  Neuro: Grossly intact  GU: No CVA tenderness  Gait : Stable     Labs:     CBC w/Diff CMP   Recent Labs   Lab 04/29/21  1022 04/28/21  1036 04/27/21  0542 04/24/21  0053 04/24/21  0002  04/23/21  1352   WBC 8.06 7.28 7.37 6.15 6.44 6.41   Hgb 10.8* 11.0* 10.7* 11.3* 10.9* 11.1*   Hematocrit 35.9 37.3 35.5 37.4 35.6 36.2   Platelets 107* 112* 106* 98* 94* 103*   MCV 93.2 96.9* 95.9 94.7 94.2 94.0   Neutrophils  --   --   --  79.6 75.3 70.5       PT/INR         Recent Labs   Lab 04/29/21  1022 04/28/21  1036 04/27/21  0542 04/24/21  0828 04/24/21  0053 04/23/21  1352   Sodium 137 137 138  More results in Results Review 135 136   Potassium 4.5 4.4 4.3  More results in Results Review 5.8* 5.8*   Chloride 102 103 104  More results in Results Review 100 99   CO2 25 24 24   More results in Results Review 23 23   BUN 49.0* 37.0* 54.0*  More results in Results Review 60.0* 57.0*   Creatinine 5.4* 4.7* 5.7*  More results in Results Review 8.2* 7.6*   Glucose 152* 234* 189*  More results in Results Review 327* 320*   Calcium 8.3* 8.0* 7.5*  More results in Results Review 8.4* 8.4*   Protein, Total  --   --   --   --  6.4 6.5   Albumin  --   --   --   --  2.6* 2.7*   AST (SGOT)  --   --   --   --  24 20   ALT  --   --   --   --  32 31   Alkaline Phosphatase  --   --   --   --  297* 279*   Bilirubin, Total  --   --   --   --  0.3 0.6   More results in Results Review = values in this interval not displayed.      Glucose POCT   Recent Labs   Lab 04/29/21  1022 04/28/21  1036 04/27/21  0542 04/25/21  2246 04/24/21  2119 04/24/21  1223 04/24/21  0828   Glucose 152* 234* 189* 381* 468* 381* 367*        Recent Labs   Lab 04/25/21  2246 04/25/21  1820 04/25/21  0356   hs Troponin-I 72.2* 59.2* 49.8*       Rads:   US Breast Left Ltd    Result Date: 04/29/2021   1. Increased skin thickening and edema as well as increased left axillary lymphadenopathy. Given  that these findings predate the patient's fall and are worsening, inflammatory breast cancer must be excluded. Recommend breast surgery consultation and skin punch biopsy. When the patient is an outpatient, diagnostic mammogram and follow-up ultrasound should be  performed. BI-RADS: 4, suspicious Earney Navy, MD 04/29/2021 2:53 PM       Luverne Farone Felipe Drone, MD  04/29/2021    *This note was generated by the Epic EMR system/ Dragon speech recognition and may contain inherent errors or omissions not intended by the user. Grammatical errors, random word insertions, deletions, pronoun errors and incomplete sentences are occasional consequences of this technology due to software limitations. Not all errors are caught or corrected. If there are questions or concerns about the content of this note or information contained within the body of this dictation they should be addressed directly with the author for clarification

## 2021-04-29 NOTE — Plan of Care (Signed)
---  Night Shift-Unit 28  Neuro: A/Ox  4, Calm/Follows commands     Cardio: NSR on the monitor, HR between 60-90 throughout the night     Pulm: On 3L via NC, SpO2 at 100 %    GI: Consistent Carb Diet,   Soft Abd, No nausea/vomiting reported.     GU: No BM, External Cath present, Peri-care done    Skin: Dry/Warm, CHG bed bath and Wound care done    Musk: PMP Activity: Bed bound   BUE: Full mobility    BLE: Limited mobility      ---Events: No significant events during the night.     Meds:   Given PO     Contact isolation / Fall risk precautions in place, bedside table and call light within reach.   Pt resting comfortably in bed at end of shift.       ---Care Plan  Problem: Moderate/High Fall Risk Score >5  Goal: Patient will remain free of falls  Outcome: Progressing     Problem: Fluid and Electrolyte Imbalance/ Endocrine  Goal: Fluid and electrolyte balance are achieved/maintained  Outcome: Progressing  Goal: Adequate hydration  Outcome: Progressing     Problem: Nutrition  Goal: Nutritional intake is adequate  Outcome: Progressing  Goal: Patient maintains weight  Outcome: Progressing  Goal: Food and/or nutrient delivery  Outcome: Progressing     Problem: Diabetes: Glucose Imbalance  Goal: Blood glucose stable at established goal  Outcome: Progressing     Problem: Potential for Aspiration  Goal: Risk of aspiration will be minimized  Outcome: Progressing     Problem: Renal Instability  Goal: Nutritional intake is adequate  Outcome: Progressing  Goal: Fluid and electrolyte balance are achieved/maintained  Outcome: Progressing  Goal: Free from infection  Outcome: Progressing     Problem: Safety  Goal: Patient will be free from injury during hospitalization  Outcome: Progressing  Goal: Patient will be free from infection during hospitalization  Outcome: Progressing     Problem: Pain  Goal: Pain at adequate level as identified by patient  Outcome: Progressing     Problem: Side Effects from Pain Analgesia  Goal: Patient will  experience minimal side effects of analgesic therapy  Outcome: Progressing     Problem: Discharge Barriers  Goal: Patient will be discharged home or other facility with appropriate resources  Outcome: Progressing     Problem: Compromised Tissue integrity  Goal: Damaged tissue is healing and protected  Outcome: Progressing  Goal: Nutritional status is improving  Outcome: Progressing     Problem: Patient Receiving Advanced Renal Therapies  Goal: Therapy access site remains intact  Outcome: Progressing

## 2021-04-29 NOTE — OT Progress Note (Signed)
Occupational Therapy Note    [facility]   Occupational Therapy Attempt Note    Patient:  [name] MRN#:  [id number]  Unit:  [address] INTERMEDIATE CARE Room/Bed:  O8325/Q9826-E      Occupational Therapy evaluation attempted on 04/29/2021 at 3:57 PM unable to complete secondary to patient eating.   Occupational therapy will follow up at a later time/date as able.    RN aware.       Rolin Barry [place], [place]  [phone]  Physical Medicine and [facility]    04/29/2021  3:57 PM

## 2021-04-29 NOTE — UM Notes (Signed)
[facility] Utilization Review   NPI #[id number], Tax ID [id number]  Please call Milas Gain MSN RN CCM @  [phone]  with any questions or concerns.  Email:  [email or web address]  Fax final authorization and requests for additional information to [phone]    CONCURRENT REVIEW FOR: 04/29/21      PATIENT NAME: Icenhower,Arvella L / 10/24/1955 / AGE: 66 y.o.      V/S:  Vital Sign Min/Max (last 24 hours)    Value Min Max   Temp 97.2 F (36.2 C) 99 F (37.2 C)   Heart Rate 59 Abnormal  73   Resp Rate 9 Abnormal  34 Abnormal    BP: Systolic 96 681   BP: Diastolic 52 79   FiO2 -- --   SpO2 92 % 100 %       Labs -   Latest Reference Range & Units 04/29/21 10:22   Hemoglobin 11.4 - 14.8 g/dL 10.8 (L)   Platelet Count 142 - 346 x10 3/uL 107 (L)   RBC 3.90 - 5.10 x10 6/uL 3.85 (L)      Latest Reference Range & Units 04/29/21 10:22   Glucose 70 - 100 mg/dL 152 (H)   BUN 7.0 - 21.0 mg/dL 49.0 (H)   Creatinine 0.4 - 1.0 mg/dL 5.4 (H)      Latest Reference Range & Units 04/29/21 10:22   Calcium 8.5 - 10.5 mg/dL 8.3 (L)       Radiologic Studies -   US Breast Left Ltd    Result Date: 04/29/2021   1. Increased skin thickening and edema as well as increased left axillary lymphadenopathy. Given that these findings predate the patient's fall and are worsening, inflammatory breast cancer must be excluded. Recommend breast surgery consultation and skin punch biopsy. When the patient is an outpatient, diagnostic mammogram and follow-up ultrasound should be performed. BI-RADS: 4, suspicious Earney Navy, MD 04/29/2021 2:53 PM        MD NOTES:  PER NEPHROLOGY   Assessment:      ESRD on HD MWF at [facility] - followed by Dr. 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 Will place on fluid restriction.       Current Medications:    Scheduled Meds:  Current Facility-Administered Medications   Medication Dose Route Frequency    amLODIPine  10 mg Oral Daily    atorvastatin  40 mg Oral Daily    carvedilol  25 mg Oral Q12H SCH    cefTRIAXone  1 g Intravenous Q24H    dextrose  50 g Intravenous Once    gabapentin  100 mg Oral Q8H [place]    heparin (porcine)  5,000 Units Subcutaneous Q12H [place]    insulin glargine  40 Units Subcutaneous QHS    insulin lispro  1-4 Units Subcutaneous QHS    insulin lispro  1-8 Units Subcutaneous TID AC    insulin lispro  8 Units Subcutaneous TID AC    petrolatum   Topical Q12H  senna-docusate  2 tablet Oral QHS    sevelamer  800 mg Oral BID Meals    vancomycin   Intravenous See Admin Instructions

## 2021-04-29 NOTE — Progress Notes (Signed)
PROGRESS NOTE     Date Time: 04/29/21 8:12 AM  Patient Name: [name],[name]      ASSESSMENT:   Peau'D orange type skin changes of the left breast without overt masses, erythema-  likely dependent edema -concern for malignancy on USG- outpatient biopsy recommended  Acute on Chronic Thrombocytopenia, likely ITP- post infectious exacerbation- stable  Chronic Normocytic Anemia, due to CKD - stable  Subcentimeter pulmonary nodule  Acute encephalopathy, status post RRT( chest compression x 1 ) , found with hypoxia, head CT negative.  Status post mechanical fall , slipped in bathroom injury to lower lip, chin.  Injury to left chest wall, edema, X Ray Neg for Fx, right great toe from fall  Recent abdominal wall abscess MRSA at PD catheter site on Vanco at dialysis.  Hyperkalemia.- resolved  End-stage renal disease-HD on MWF  Right great toe total nail avulsion, wound. 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 Increased skin thickening and edema as well as increased left axillary lymphadenopathy. Given that these findings predate the patient's fall and are worsening, inflammatory breast cancer must be excluded.     Medications:      Scheduled Meds: PRN Meds:    amLODIPine, 10 mg, Oral, Daily  atorvastatin, 40 mg,  Oral, Daily  carvedilol, 25 mg, Oral, Q12H SCH  cefTRIAXone, 1 g, Intravenous, Q24H  dextrose, 50 g, Intravenous, Once  gabapentin, 100 mg, Oral, Q8H SCH  heparin (porcine), 5,000 Units, Subcutaneous, Q12H SCH  insulin glargine, 40 Units, Subcutaneous, QHS  insulin lispro, 1-4 Units, Subcutaneous, QHS  insulin lispro, 1-8 Units, Subcutaneous, TID AC  insulin lispro, 8 Units, Subcutaneous, TID AC  petrolatum, , Topical, Q12H  senna-docusate, 2 tablet, Oral, QHS  sevelamer, 800 mg, Oral, BID Meals  vancomycin, , Intravenous, See Admin Instructions         acetaminophen, 650 mg, Q6H PRN   Or  acetaminophen, 650 mg, Q6H PRN  albuterol-ipratropium, 3 mL, Q6H PRN  dextrose, 15 g of glucose, PRN   Or  dextrose, 12.5 g, PRN   Or  dextrose, 12.5 g, PRN   Or  glucagon (rDNA), 1 mg, PRN  hydrALAZINE, 10 mg, Q6H PRN  lactulose, 10 g, Daily PRN  magnesium sulfate, 1 g, PRN  melatonin, 3 mg, QHS PRN  naloxone, 0.2 mg, PRN  ondansetron, 4 mg, Q6H PRN   Or  ondansetron, 4 mg, Q6H PRN  oxyCODONE-acetaminophen, 1 tablet, Q4H PRN  polyethylene glycol, 17 g, Daily PRN          I personally reviewed all of the medications    Review of Systems:   Per narrative, 2 system review was done.    Physical Exam:  Visit Vitals  BP 171/78   Pulse 65   Temp 98.4 F (36.9 C) (Axillary)   Resp 18   Ht 1.575 m (5\' 2" )   Wt 101.2 kg (223 lb 1.7 oz)   LMP  (LMP Unknown)   SpO2 99%   BMI 40.81 kg/m         Intake/Output Summary (Last 24 hours) at 04/29/2021 1505  Last data filed at 04/28/2021 1700  Gross per 24 hour   Intake --   Output 3400 ml   Net -3400 ml          Physical Exam:      General: no acute distress.  Skin: no rashes or lesions.  HENT: no scleral icterus, no lymphadenopathy.  Lungs: Bilateral breath sounds anterior chest,   Bilateral breast examination was done and left breast appears tender to touch with some skin edema and pitting without any overt erythema  or palpable lumps.  Right breast exam was normal.  Heart: normal rate, regular  rhythm, no murmur.  Abdomen: soft, non-tender, no masses.  Musculoskeletal: Right great toenail removed.   Neurologic: alert, and oriented X3, CN normal speech, no focal findings or movement disorder noted.            Labs:     Recent Labs   Lab 04/28/21  1036 04/27/21  0542 04/25/21  2246 04/24/21  2119 04/24/21  0828 04/24/21  0053 04/23/21  1352   Glucose 234* 189* 381* 468*  More results in Results Review 327* 320*   BUN 37.0* 54.0*  --  46.0*  More results in Results Review 60.0* 57.0*   Creatinine 4.7* 5.7*  --  6.5*  More results in Results Review 8.2* 7.6*   Calcium 8.0* 7.5*  --  8.6  More results in Results Review 8.4* 8.4*   Sodium 137 138  --  136  More results in Results Review 135 136   Potassium 4.4 4.3  --  5.4*  More results in Results Review 5.8* 5.8*   Chloride 103 104  --  98*  More results in Results Review 100 99   CO2 24 24  --  22  More results in Results Review 23 23   Albumin  --   --   --   --   --  2.6* 2.7*   AST (SGOT)  --   --   --   --   --  24 20   ALT  --   --   --   --   --  32 31   Bilirubin, Total  --   --   --   --   --  0.3 0.6   Alkaline Phosphatase  --   --   --   --   --  297* 279*   More results in Results Review = values in this interval not displayed.     Recent Labs   Lab 04/28/21  1036 04/27/21  0542 04/24/21  0053   WBC 7.28 7.37 6.15   Hgb 11.0* 10.7* 11.3*   Hematocrit 37.3 35.5 37.4   MCV 96.9* 95.9 94.7   MCH 28.6 28.9 28.6   MCHC 29.5* 30.1* 30.2*   Platelets 112* 106* 98*       No results for input(s): PTT, PT, INR in the last 72 hours.      Microbiology:  Microbiology Results (last 15 days)       Procedure Component Value Units Date/Time  COVID-19 (SARS-CoV-2) only ([facility]) asymptomatic admission [[id number]] Collected: 04/23/21 1352    Order Status: Completed Specimen: Nasopharyngeal Updated: 04/23/21 1429     Purpose of COVID testing Screening     SARS-CoV-2 Specimen Source Nasal Swab     SARS CoV 2 Overall Result Not Detected     Comment:  __________________________________________________  -A result of "Detected" indicates POSITIVE for the    presence of SARS CoV-2 RNA  -A result of "Not Detected" indicates NEGATIVE for the    presence of SARS CoV-2 RNA  __________________________________________________________  Test performed using the Roche cobas Liat SARS-CoV-2 assay. This assay is  only for use under the Food and Drug Administration's Emergency Use  Authorization. This is a real-time RT-PCR assay for the qualitative  detection of SARS-CoV-2 RNA. Viral nucleic acids may persist in vivo,  independent of viability. Detection of viral nucleic acid does not imply the  presence of infectious virus, or that virus nucleic acid is the cause of  clinical symptoms. Negative results do not preclude SARS-CoV-2 infection and  should not be used as the sole basis for diagnosis, treatment or other  patient management decisions. Negative results must be combined with  clinical observations, patient history, and/or epidemiological information.  Invalid results may be due to inhibiting substances in the specimen and  recollection should occur. Please see Fact Sheets for patients and providers  located:  https://www.[employer].com/         Narrative:      o Collect and clearly label specimen type:  o PREFERRED-Upper respiratory specimen: One Nasal Swab in  Transport Media.  o Hand deliver to laboratory ASAP  Indication for testing->Extended care facility admission to  semi private room  Screening    Culture + Gram Lenise Arena, Wound [[id number]] Collected: 04/14/21 1609    Order Status: Completed Specimen: Wound Updated: 04/17/21 2031    Narrative:      [id number] called Micro Results of MRSA. Read back [id number], by [id number] on  04/17/2021 at 20:30  ESwab  ORDER#: [id number]                                    ORDERED BY: CHOUDHARY, IMTI  SOURCE: Wound Abdominal wall wound                   COLLECTED:  04/14/21 16:09  ANTIBIOTICS AT COLL.:                                 RECEIVED :  04/14/21 22:37  ORDER ENTRY COMMENTS:  ESwab  [id number] called Micro Results of MRSA. Read back [id number], by [id number] on 04/17/2021 at 20:30  Stain, Gram                                FINAL       04/15/21 00:03  04/15/21   No Squamous epithelial cells seen             No WBCs or organisms seen  Culture and Gram Stain, Aerobic, Wound     FINAL       04/17/21 20:31   +  04/15/21   Heavy growth of Staphylococcus aureus               Refer to susceptibilities on culture #[id number]  Culture + Gram Lenise Arena, Wound [[id number]] Collected: 04/14/21 1201    Order Status: Completed Specimen: Wound Updated: 04/17/21 1648    Narrative:      Oren Binet Culturette  ORDER#: [id number]                                    ORDERED BY: Gustavus Messing, IMTI  SOURCE: Wound abdomen                                COLLECTED:  04/14/21 12:01  ANTIBIOTICS AT COLL.:                                RECEIVED :  04/14/21 15:22  ORDER ENTRY COMMENTS:  Blue Culturette  Stain, Gram                                FINAL       04/14/21 16:55   +  04/14/21   No Squamous epithelial cells seen             Many WBCs             Moderate Gram positive cocci  Culture and Gram Stain, Aerobic, Wound     FINAL       04/17/21 16:48   +  04/17/21   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      _____________________________________________________________________________                                      MRSA        ANTIBIOTICS                     MIC  INTRP      _____________________________________________________________________________  Clindamycin                    <=0.5   S        Daptomycin                      <=1    S        Erythromycin                    >4     R        Gentamicin                      <=2    S        Levofloxacin                     4     I        Linezolid                        2     S        Oxacillin                       >2     R  D1    Penicillin                      >  1     R        Rifampin                        <=0.5   S        Tetracycline                   <=0.5   S        Trimethoprim/Sulfamethoxazole <=0.5/9  S        Vancomycin                       1     S          -----DRUG COMMENTS----------    D1:  Oxacillin sensitivity predicts sensitivity to cephalosporins.         [place] [employer] 2016  _____________________________________________________________________________            S=SUSCEPTIBLE     I=INTERMEDIATE     R=RESISTANT       N/S=NON-SUSCEPTIBLE     SDD=SUSCEPTIBLE-DOSE DEPENDENT  _____________________________________________________________________________                  Signed by: Cindi Carbon, MD

## 2021-04-29 NOTE — Progress Notes (Signed)
PULMONARY PROGRESS NOTE                                                                                                              [phone]    Date Time: 04/29/21 7:01 PM  Patient Name: [name], [name] 66 y.o. female admitted with Altered mental status  Admit Date: 04/23/2021    Patient status: Observation  Hospital Day: 0           Assessment:     Subcentimeter pulmonary nodules  Obstructive sleep apnea  End-stage renal disease  Swelling of the left breast suspicious for malignancy  Hypertension  Hyperlipidemia  Plan:   Breast biopsy  Dialysis as per nephrology  Nocturnal CPAP  Glycemic control  Follow pulmonary nodule        Subjective:             Patient seen and examined no new complaints noted  Underwent ultrasound of the left breast suggestive of inflammatory malignancy    Medications:     Current Facility-Administered Medications   Medication Dose Route Frequency    amLODIPine  10 mg Oral Daily    atorvastatin  40 mg Oral Daily    carvedilol  25 mg Oral Q12H SCH    dextrose  50 g Intravenous Once    gabapentin  100 mg Oral Q8H [place]    heparin (porcine)  5,000 Units Subcutaneous Q12H [place]    insulin glargine  40 Units Subcutaneous QHS    insulin lispro  1-4 Units Subcutaneous QHS    insulin lispro  1-8 Units Subcutaneous TID AC    insulin lispro  8 Units Subcutaneous TID AC    petrolatum   Topical Q12H    senna-docusate  2 tablet Oral QHS    sevelamer  800 mg Oral BID Meals    vancomycin   Intravenous See Admin Instructions       Review of Systems:        General ROS: negative for - chills, fever or night sweats  Ophthalmic ROS: negative for - double vision, excessive tearing, itchy eyes or photophobia  ENT ROS: negative for - headaches, nasal congestion, sore throat or vertigo  Respiratory ROS: negative for - cough, hemoptysis, orthopnea, pleuritic pain, shortness of breath, tachypnea or wheezing  Cardiovascular ROS: negative for - chest pain, edema, orthopnea, rapid heart rate  Gastrointestinal ROS:  negative for - abdominal pain, blood in stools, constipation, diarrhea, heartburn or nausea/vomiting  Musculoskeletal ROS: negative for - muscle pain  Neurological ROS: negative for - confusion, dizziness, headaches, speech problems, visual changes or weakness  Dermatological ROS: negative for dry skin, pruritus and rash      Physical Exam:     Vitals:    04/29/21 1644   BP: 134/61   Pulse: 72   Resp: 18   Temp: 97.5 F (36.4 C)   SpO2: 98%         Intake/Output Summary (Last 24 hours) at 04/29/2021 1901  Last data filed at 04/29/2021 1800  Gross per  24 hour   Intake --   Output 3850 ml   Net -3850 ml          General appearance - alert,  and in no distress  Mental status - alert, oriented to person, place, and time  Eyes - pupils equal and reactive, extraocular eye movements intact  Ears - no external ear lesions seen  Nose - no nasal discharge   Mouth - clear oral mucosa, moist   Neck - supple, no significant adenopathy  Chest - clear to auscultation, no wheezes, rales or rhonchi, symmetric air entry, left breast swelling  Heart - normal rate, regular rhythm, normal S1, S2, no murmurs, rubs, clicks or gallops  Abdomen - soft, nontender, nondistended, no masses or organomegaly  Neurological - alert, oriented, normal speech, no focal findings or movement disorder noted  Musculoskeletal - no joint swelling or tenderness  Extremities -  no pedal edema, no clubbing or cyanosis  Skin - normal coloration, no rashes      Labs:     CBC w/Diff CMP   Recent Labs   Lab 04/29/21  1022 04/28/21  1036 04/27/21  0542 04/24/21  0053 04/24/21  0002 04/23/21  1352   WBC 8.06 7.28 7.37 6.15 6.44 6.41   Hgb 10.8* 11.0* 10.7* 11.3* 10.9* 11.1*   Hematocrit 35.9 37.3 35.5 37.4 35.6 36.2   Platelets 107* 112* 106* 98* 94* 103*   MCV 93.2 96.9* 95.9 94.7 94.2 94.0   Neutrophils  --   --   --  79.6 75.3 70.5       PT/INR         Recent Labs   Lab 04/29/21  1022 04/28/21  1036 04/27/21  0542 04/24/21  0828 04/24/21  0053 04/23/21  1352    Sodium 137 137 138  More results in Results Review 135 136   Potassium 4.5 4.4 4.3  More results in Results Review 5.8* 5.8*   Chloride 102 103 104  More results in Results Review 100 99   CO2 25 24 24   More results in Results Review 23 23   BUN 49.0* 37.0* 54.0*  More results in Results Review 60.0* 57.0*   Creatinine 5.4* 4.7* 5.7*  More results in Results Review 8.2* 7.6*   Glucose 152* 234* 189*  More results in Results Review 327* 320*   Calcium 8.3* 8.0* 7.5*  More results in Results Review 8.4* 8.4*   Protein, Total  --   --   --   --  6.4 6.5   Albumin  --   --   --   --  2.6* 2.7*   AST (SGOT)  --   --   --   --  24 20   ALT  --   --   --   --  32 31   Alkaline Phosphatase  --   --   --   --  297* 279*   Bilirubin, Total  --   --   --   --  0.3 0.6   More results in Results Review = values in this interval not displayed.      Glucose POCT   Recent Labs   Lab 04/29/21  1022 04/28/21  1036 04/27/21  0542 04/25/21  2246 04/24/21  2119 04/24/21  1223 04/24/21  0828   Glucose 152* 234* 189* 381* 468* 381* 367*        Recent Labs   Lab 04/25/21  2246 04/25/21  1820 04/25/21  0356   hs Troponin-I 72.2* 59.2* 49.8*         ABGs:    ABG CollectionSite   Date Value Ref Range Status   04/23/2021 Right Radl  Final     Allen's Test   Date Value Ref Range Status   04/23/2021 Yes  Final     pH, Arterial   Date Value Ref Range Status   04/23/2021 7.242 (L) 7.350 - 7.450 Final     pCO2, Arterial   Date Value Ref Range Status   04/23/2021 59.5 (H) 35.0 - 45.0 mmhg Final     pO2, Arterial   Date Value Ref Range Status   04/23/2021 <42.7 (L) 80.0 - 90.0 mmhg Final     Comment:     Critical pO2 results called to [id number].  Readback confirmed by MM at  04/23/2021  22:37       HCO3, Arterial   Date Value Ref Range Status   04/23/2021 24.7 23.0 - 29.0 mEq/L Final     Base Excess, Arterial   Date Value Ref Range Status   04/23/2021 -3.0 (L) -2.0 - 2.0 mEq/L Final     O2 Sat, Arterial   Date Value Ref Range Status   04/23/2021 45.3  (L) 95.0 - 100.0 % Final       Urinalysis        Invalid input(s): LEUKOCYTESUR      Rads:   US Breast Left Ltd    Result Date: 04/29/2021   1. Increased skin thickening and edema as well as increased left axillary lymphadenopathy. Given that these findings predate the patient's fall and are worsening, inflammatory breast cancer must be excluded. Recommend breast surgery consultation and skin punch biopsy. When the patient is an outpatient, diagnostic mammogram and follow-up ultrasound should be performed. BI-RADS: 4, suspicious Earney Navy, MD 04/29/2021 2:53 PM       Quillian Quince MD  04/29/2021  7:01 PM

## 2021-04-29 NOTE — Progress Notes (Addendum)
[facility] PROGRESS NOTE  [id number]      Date Time: 04/29/21 2:45 PM  Patient Name: [name]  Attending Physician: Barbaraann Faster, MD    CC: follow-up ESRD     Assessment:     ESRD on HD MWF at [facility] - followed by Dr. Randel Pigg  Hyperkalemia due to missed HD & hyperglycemia- last HD on 3/29, 4/2  Mild hypocalcemia due to CKD  Anemia in CKD, hgb at goal  HTN - controlled  DM type 2 with hyperglycemia  Hyperphosphatemia: PTH 1579 on 3/23  Secondary hyperparathyroidism  Fall   Abdominal wall abscess MRSA on vancomycin     Recommendations:     HD today with 3.8L off  Assess for extra UF tomorrow (wt dropped from 110-> 101kg)   Emphasized need for fluid restriction but continues to drink more than advised. Will place on fluid restriction.     Case discussed with: RN, pt      Gaylyn Lambert, MD  [facility]  [id number] (office)      Subjective:     Drink way above her limit  Remains on supplemental oxygen  Chest pain from fall  No new complains    Review of Systems:     No cp  No sob  R Toe pain      Physical Exam:     Vitals:    04/29/21 1330 04/29/21 1345 04/29/21 1350 04/29/21 1413   BP: 131/65 120/57 126/58 119/59   Pulse: 61 60 64 61   Resp: 13 16 20 16    Temp:    97.2 F (36.2 C)   TempSrc:       SpO2: 99% 100% 99% 100%   Weight:       Height:           Intake and Output Summary (Last 24 hours) at Date Time    Intake/Output Summary (Last 24 hours) at 04/29/2021 1445  Last data filed at 04/29/2021 1413  Gross per 24 hour   Intake --   Output 7200 ml   Net -7200 ml         General: awake, alert, oriented x 3, no acute distress  Cardiovascular: regular rate and rhythm, no murmurs, rubs or gallops  Lungs: clear to auscultation bilaterally, without wheezing, rhonchi, or rales  Abdomen: soft, non-tender, non-distended, normoactive bowel sounds  Extremities: no clubbing, cyanosis, or edema      Meds:      Scheduled Meds: PRN Meds:    amLODIPine, 10 mg, Oral,  Daily  atorvastatin, 40 mg, Oral, Daily  carvedilol, 25 mg, Oral, Q12H SCH  cefTRIAXone, 1 g, Intravenous, Q24H  dextrose, 50 g, Intravenous, Once  gabapentin, 100 mg, Oral, Q8H SCH  heparin (porcine), 5,000 Units, Subcutaneous, Q12H SCH  insulin glargine, 40 Units, Subcutaneous, QHS  insulin lispro, 1-4 Units, Subcutaneous, QHS  insulin lispro, 1-8 Units, Subcutaneous, TID AC  insulin lispro, 8 Units, Subcutaneous, TID AC  petrolatum, , Topical, Q12H  senna-docusate, 2 tablet, Oral, QHS  sevelamer, 800 mg, Oral, BID Meals  vancomycin, , Intravenous, See Admin Instructions          Continuous Infusions:   acetaminophen, 650 mg, Q6H PRN   Or  acetaminophen, 650 mg, Q6H PRN  albumin human, 100 mL, PRN  albuterol-ipratropium, 3 mL, Q6H PRN  dextrose, 15 g of glucose, PRN   Or  dextrose, 12.5 g, PRN   Or  dextrose, 12.5 g, PRN   Or  glucagon (rDNA), 1 mg, PRN  hydrALAZINE, 10 mg, Q6H PRN  lactulose, 10 g, Daily PRN  magnesium sulfate, 1 g, PRN  melatonin, 3 mg, QHS PRN  naloxone, 0.2 mg, PRN  ondansetron, 4 mg, Q6H PRN   Or  ondansetron, 4 mg, Q6H PRN  oxyCODONE-acetaminophen, 1 tablet, Q4H PRN  polyethylene glycol, 17 g, Daily PRN  sodium chloride, 100 mL, Q1H PRN  sodium chloride, 250 mL, PRN              Labs:     Recent Labs   Lab 04/29/21  1022 04/28/21  1036 04/27/21  0542   WBC 8.06 7.28 7.37   Hgb 10.8* 11.0* 10.7*   Hematocrit 35.9 37.3 35.5   Platelets 107* 112* 106*       Recent Labs   Lab 04/29/21  1022 04/28/21  1036 04/27/21  0542 04/24/21  0828 04/24/21  0053 04/23/21  1352   Sodium 137 137 138  More results in Results Review 135 136   Potassium 4.5 4.4 4.3  More results in Results Review 5.8* 5.8*   Chloride 102 103 104  More results in Results Review 100 99   CO2 25 24 24   More results in Results Review 23 23   BUN 49.0* 37.0* 54.0*  More results in Results Review 60.0* 57.0*   Creatinine 5.4* 4.7* 5.7*  More results in Results Review 8.2* 7.6*   Calcium 8.3* 8.0* 7.5*  More results in Results Review  8.4* 8.4*   Albumin  --   --   --   --  2.6* 2.7*   Glucose 152* 234* 189*  More results in Results Review 327* 320*   EGFR 9.6 11.2 9.0  More results in Results Review 5.9 6.5   More results in Results Review = values in this interval not displayed.               Invalid input(s): LEUKOCYTESUR        Imaging personally reviewed, including: US Breast Left Ltd    Result Date: 04/29/2021   1. Increased skin thickening and edema as well as increased left axillary lymphadenopathy. Given that these findings predate the patient's fall and are worsening, inflammatory breast cancer must be excluded. Recommend breast surgery consultation and skin punch biopsy. When the patient is an outpatient, diagnostic mammogram and follow-up ultrasound should be performed. BI-RADS: 4, suspicious         Signed by: Gaylyn Lambert, MD

## 2021-04-30 LAB — CBC
Absolute NRBC: 0 10*3/uL (ref 0.00–0.00)
Hematocrit: 32.9 % — ABNORMAL LOW (ref 34.7–43.7)
Hgb: 9.7 g/dL — ABNORMAL LOW (ref 11.4–14.8)
MCH: 28 pg (ref 25.1–33.5)
MCHC: 29.5 g/dL — ABNORMAL LOW (ref 31.5–35.8)
MCV: 94.8 fL (ref 78.0–96.0)
MPV: 12.2 fL (ref 8.9–12.5)
Nucleated RBC: 0 /100 WBC (ref 0.0–0.0)
Platelets: 90 10*3/uL — ABNORMAL LOW (ref 142–346)
RBC: 3.47 10*6/uL — ABNORMAL LOW (ref 3.90–5.10)
RDW: 14 % (ref 11–15)
WBC: 6.68 10*3/uL (ref 3.10–9.50)

## 2021-04-30 LAB — BASIC METABOLIC PANEL
Anion Gap: 8 (ref 5.0–15.0)
BUN: 40 mg/dL — ABNORMAL HIGH (ref 7.0–21.0)
CO2: 28 mEq/L (ref 17–29)
Calcium: 8.3 mg/dL — ABNORMAL LOW (ref 8.5–10.5)
Chloride: 103 mEq/L (ref 99–111)
Creatinine: 4.3 mg/dL — ABNORMAL HIGH (ref 0.4–1.0)
Glucose: 264 mg/dL — ABNORMAL HIGH (ref 70–100)
Potassium: 4.7 mEq/L (ref 3.5–5.3)
Sodium: 139 mEq/L (ref 135–145)

## 2021-04-30 LAB — GLUCOSE WHOLE BLOOD - POCT
Whole Blood Glucose POCT: 184 mg/dL — ABNORMAL HIGH (ref 70–100)
Whole Blood Glucose POCT: 116 mg/dL — ABNORMAL HIGH (ref 70–100)
Whole Blood Glucose POCT: 177 mg/dL — ABNORMAL HIGH (ref 70–100)
Whole Blood Glucose POCT: 180 mg/dL — ABNORMAL HIGH (ref 70–100)

## 2021-04-30 LAB — GFR: EGFR: 12.5

## 2021-04-30 NOTE — Plan of Care (Addendum)
Neuro: Patient A&Ox4  Resp: Diminished bilateral lung sounds, 3L nasal cannula  CV: NSR on tele, cont pulse ox; VSS WDL   GI: No nausea/vomiting and tolerating heart healthy/ consistent carb diet  GU: External cath  M: Active in all extremities  S: WDL except excoriation in peri area  DVT PP: SCDs are on while pt in bed  Safety: Fall mat in place, bedside table and call bell within reach. Bedside rails x3 up. Patient instructed to call when needing assistance. Pt verbalized understanding. Purposeful rounding by the nurse and tech  Pain control with PRN meds, encourage ambulation; encourage use of IS  1 BM during shift  Family at bedside  Wound care done           Problem: Moderate/High Fall Risk Score >5  Goal: Patient will remain free of falls  Outcome: Progressing  Flowsheets (Taken 04/30/2021 0821)  High (Greater than 13):   HIGH-Bed alarm on at all times while patient in bed   HIGH-Utilize chair pad alarm for patient while in the chair   HIGH-Apply yellow "Fall Risk" arm band   HIGH-Initiate use of floor mats as appropriate   HIGH-Consider use of low bed     Problem: Fluid and Electrolyte Imbalance/ Endocrine  Goal: Fluid and electrolyte balance are achieved/maintained  Outcome: Progressing  Flowsheets (Taken 04/30/2021 1779)  Fluid and electrolyte balance are achieved/maintained:   Monitor intake and output every shift   Monitor/assess lab values and report abnormal values   Assess for confusion/personality changes   Provide adequate hydration   Monitor daily weight   Assess and reassess fluid and electrolyte status   Observe for seizure activity and initiate seizure precautions if indicated   Observe for cardiac arrhythmias   Monitor for muscle weakness  Goal: Adequate hydration  Outcome: Progressing     Problem: Nutrition  Goal: Nutritional intake is adequate  Outcome: Progressing  Flowsheets (Taken 04/30/2021 3903)  Nutritional intake is adequate:   Monitor daily weights   Assist patient with meals/food  selection   Allow adequate time for meals   Encourage/perform oral hygiene as appropriate   Include patient/patient care companion in decisions related to nutrition   Assess anorexia, appetite, and amount of meal/food tolerated     Problem: Diabetes: Glucose Imbalance  Goal: Blood glucose stable at established goal  Outcome: Progressing     Problem: Renal Instability  Goal: Nutritional intake is adequate  Outcome: Progressing  Flowsheets (Taken 04/30/2021 0092)  Nutritional intake is adequate:   Monitor daily weights   Assist patient with meals/food selection   Allow adequate time for meals   Encourage/perform oral hygiene as appropriate   Include patient/patient care companion in decisions related to nutrition   Assess anorexia, appetite, and amount of meal/food tolerated  Goal: Fluid and electrolyte balance are achieved/maintained  Outcome: Progressing  Flowsheets (Taken 04/30/2021 3300)  Fluid and electrolyte balance are achieved/maintained:   Monitor intake and output every shift   Monitor/assess lab values and report abnormal values   Provide adequate hydration   Monitor daily weight   Assess for confusion/personality changes   Assess and reassess fluid and electrolyte status   Observe for seizure activity and initiate seizure precautions if indicated   Observe for cardiac arrhythmias   Monitor for muscle weakness   Follow fluid restrictions/IV/PO parameters     Problem: Patient Receiving Advanced Renal Therapies  Goal: Therapy access site remains intact  Outcome: Progressing  Flowsheets (Taken 04/30/2021 7622)  Therapy access  site remains intact: Assess therapy access site     Problem: Safety  Goal: Patient will be free from injury during hospitalization  Outcome: Progressing  Flowsheets (Taken 04/30/2021 0822)  Patient will be free from injury during hospitalization:   Assess patient's risk for falls and implement fall prevention plan of care per policy   Provide and maintain safe environment   Use appropriate  transfer methods   Ensure appropriate safety devices are available at the bedside   Include patient/ family/ care giver in decisions related to safety   Hourly rounding   Assess for patients risk for elopement and implement Elopement Risk Plan per policy     Problem: Compromised Tissue integrity  Goal: Damaged tissue is healing and protected  Outcome: Progressing  Flowsheets (Taken 04/30/2021 0822)  Damaged tissue is healing and protected:   Monitor/assess Braden scale every shift   Provide wound care per wound care algorithm   Increase activity as tolerated/progressive mobility   Relieve pressure to bony prominences for patients at moderate and high risk   Avoid shearing injuries   Keep intact skin clean and dry   Use bath wipes, not soap and water, for daily bathing   Use incontinence wipes for cleaning urine, stool and caustic drainage. Foley care as needed   Monitor external devices/tubes for correct placement to prevent pressure, friction and shearing   Monitor patient's hygiene practices   Encourage use of lotion/moisturizer on skin

## 2021-04-30 NOTE — Progress Notes (Signed)
PROGRESS NOTE     Date Time: 04/30/21 10:42 PM  Patient Name: [name],[name]      ASSESSMENT:   Peau'D orange type skin changes of the left breast without overt masses, erythema-  likely dependent edema -concern for malignancy on USG- outpatient biopsy recommended  Acute on Chronic Thrombocytopenia, likely ITP- post infectious exacerbation- stable  Chronic Normocytic Anemia, due to CKD - stable  Subcentimeter pulmonary nodule  Acute encephalopathy, status post RRT( chest compression x 1 ) , found with hypoxia, head CT negative.  Status post mechanical fall , slipped in bathroom injury to lower lip, chin.  Injury to left chest wall, edema, X Ray Neg for Fx, right great toe from fall  Recent abdominal wall abscess MRSA at PD catheter site on Vanco at dialysis.  Hyperkalemia.- resolved  End-stage renal disease-HD on MWF  Right great toe total nail avulsion, wound. 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    Subjective:     C/o Chest pain  Denies nausea vomiting  Generalized weakness    No fevers, chills.      Medications:      Scheduled Meds: PRN Meds:    amLODIPine, 10 mg, Oral, Daily  atorvastatin, 40 mg, Oral, Daily  carvedilol, 25 mg, Oral, Q12H SCH  dextrose, 50 g, Intravenous, Once  gabapentin, 100 mg, Oral, Q8H SCH  heparin (porcine), 5,000 Units, Subcutaneous, Q12H SCH  insulin  glargine, 40 Units, Subcutaneous, QHS  insulin lispro, 1-4 Units, Subcutaneous, QHS  insulin lispro, 1-8 Units, Subcutaneous, TID AC  insulin lispro, 8 Units, Subcutaneous, TID AC  petrolatum, , Topical, Q12H  senna-docusate, 2 tablet, Oral, QHS  sevelamer, 800 mg, Oral, BID Meals  vancomycin, , Intravenous, See Admin Instructions         acetaminophen, 650 mg, Q6H PRN   Or  acetaminophen, 650 mg, Q6H PRN  albuterol-ipratropium, 3 mL, Q6H PRN  dextrose, 15 g of glucose, PRN   Or  dextrose, 12.5 g, PRN   Or  dextrose, 12.5 g, PRN   Or  glucagon (rDNA), 1 mg, PRN  hydrALAZINE, 10 mg, Q6H PRN  lactulose, 10 g, Daily PRN  magnesium sulfate, 1 g, PRN  melatonin, 3 mg, QHS PRN  naloxone, 0.2 mg, PRN  ondansetron, 4 mg, Q6H PRN   Or  ondansetron, 4 mg, Q6H PRN  oxyCODONE-acetaminophen, 1 tablet, Q4H PRN  polyethylene glycol, 17 g, Daily PRN          I personally reviewed all of the medications    Review of Systems:   Per narrative, 2 system review was done.    Physical Exam:   Visit Vitals  BP 150/65   Pulse 67   Temp 97.3 F (36.3 C) (Oral)   Resp 20   Ht 1.575 m (5\' 2" )   Wt  101.9 kg (224 lb 10.4 oz)   LMP  (LMP Unknown)   SpO2 97%   BMI 41.09 kg/m         Intake/Output Summary (Last 24 hours) at 04/30/2021 2242  Last data filed at 04/30/2021 0845  Gross per 24 hour   Intake 118 ml   Output --   Net 118 ml            Physical Exam:      General: no acute distress.  Skin: no rashes or lesions.  HENT: no scleral icterus, no lymphadenopathy.  Lungs: Bilateral breath sounds anterior chest,   Bilateral breast examination was done and left breast appears tender to touch with some skin edema and pitting without any overt erythema  or palpable lumps.  Right breast exam was normal.  Heart: normal rate, regular rhythm, no murmur.  Abdomen: soft, non-tender, no masses.  Musculoskeletal: Right great toenail removed.   Neurologic: alert, and oriented X3, CN normal speech, no focal findings or movement disorder noted.            Labs:      Recent Labs   Lab 04/30/21  0412 04/29/21  1022 04/28/21  1036 04/24/21  0828 04/24/21  0053   Glucose 264* 152* 234*  More results in Results Review 327*   BUN 40.0* 49.0* 37.0*  More results in Results Review 60.0*   Creatinine 4.3* 5.4* 4.7*  More results in Results Review 8.2*   Calcium 8.3* 8.3* 8.0*  More results in Results Review 8.4*   Sodium 139 137 137  More results in Results Review 135   Potassium 4.7 4.5 4.4  More results in Results Review 5.8*   Chloride 103 102 103  More results in Results Review 100   CO2 28 25 24   More results in Results Review 23   Albumin  --   --   --   --  2.6*   AST (SGOT)  --   --   --   --  24   ALT  --   --   --   --  32   Bilirubin, Total  --   --   --   --  0.3   Alkaline Phosphatase  --   --   --   --  297*   More results in Results Review = values in this interval not displayed.       Recent Labs   Lab 04/30/21  0412 04/29/21  1022 04/28/21  1036   WBC 6.68 8.06 7.28   Hgb 9.7* 10.8* 11.0*   Hematocrit 32.9* 35.9 37.3   MCV 94.8 93.2 96.9*   MCH 28.0 28.1 28.6   MCHC 29.5* 30.1* 29.5*   Platelets 90* 107* 112*         No results for input(s): PTT, PT, INR in the last 72 hours.      Microbiology:  Microbiology Results (last 15 days)       Procedure Component Value Units Date/Time    COVID-19 (SARS-CoV-2) only ([facility]) asymptomatic admission [[id number]] Collected: 04/23/21 1352    Order Status: Completed Specimen: Nasopharyngeal Updated: 04/23/21 1429     Purpose of COVID testing Screening     SARS-CoV-2 Specimen Source Nasal Swab     SARS CoV 2 Overall Result Not Detected     Comment: __________________________________________________  -A result of "Detected" indicates POSITIVE for the    presence of SARS CoV-2 RNA  -A result of "  Not Detected" indicates NEGATIVE for the    presence of SARS CoV-2 RNA  __________________________________________________________  Test performed using the Roche cobas [facility]iat SARS-CoV-2 assay. This assay is  only for use under the Food and  Drug Administration's Emergency Use  Authorization. This is a real-time RT-PCR assay for the qualitative  detection of SARS-CoV-2 RNA. Viral nucleic acids may persist in vivo,  independent of viability. Detection of viral nucleic acid does not imply the  presence of infectious virus, or that virus nucleic acid is the cause of  clinical symptoms. Negative results do not preclude SARS-CoV-2 infection and  should not be used as the sole basis for diagnosis, treatment or other  patient management decisions. Negative results must be combined with  clinical observations, patient history, and/or epidemiological information.  Invalid results may be due to inhibiting substances in the specimen and  recollection should occur. Please see Fact Sheets for patients and providers  located:  https://www.[employer]-ch[employer].com/         Narrative:      o Collect and clearly label specimen type:  o PREFERRED-Upper respiratory specimen: One Nasal Swab in  Transport Media.  o Hand deliver to laboratory ASAP  Indication for testing->Extended care facility admission to  semi private room  Screening                Signed by: Cindi Carbon, MD

## 2021-04-30 NOTE — Plan of Care (Signed)
---  Night Shift-Unit 28  Neuro: A/Ox  4, Calm/Follows commands      Cardio: NSR on the monitor, HR between 60-90 throughout the night      Pulm: On 3L via NC, SpO2 at 100 %     GI: Consistent Carb Diet,   Soft Abd, No nausea/vomiting reported.      GU: No BM, External Cath present, Peri-care done     Skin: Dry/Warm, CHG bed bath and Wound care done     Musk: PMP Activity: Bed bound   BUE: Full mobility    BLE: Limited mobility       ---Events: No significant events during the night.     Meds:   Given PO      Contact isolation / Fall risk precautions in place, bedside table and call light within reach.   Pt resting comfortably in bed at end of shift.      Problem: Moderate/High Fall Risk Score >5  Goal: Patient will remain free of falls  Outcome: Progressing     Problem: Fluid and Electrolyte Imbalance/ Endocrine  Goal: Fluid and electrolyte balance are achieved/maintained  Outcome: Progressing  Goal: Adequate hydration  Outcome: Progressing     Problem: Nutrition  Goal: Nutritional intake is adequate  Outcome: Progressing  Goal: Patient maintains weight  Outcome: Progressing  Goal: Food and/or nutrient delivery  Outcome: Progressing     Problem: Diabetes: Glucose Imbalance  Goal: Blood glucose stable at established goal  Outcome: Progressing     Problem: Potential for Aspiration  Goal: Risk of aspiration will be minimized  Outcome: Progressing     Problem: Renal Instability  Goal: Nutritional intake is adequate  Outcome: Progressing  Goal: Fluid and electrolyte balance are achieved/maintained  Outcome: Progressing  Goal: Free from infection  Outcome: Progressing     Problem: Safety  Goal: Patient will be free from injury during hospitalization  Outcome: Progressing  Goal: Patient will be free from infection during hospitalization  Outcome: Progressing     Problem: Pain  Goal: Pain at adequate level as identified by patient  Outcome: Progressing     Problem: Side Effects from Pain Analgesia  Goal: Patient will  experience minimal side effects of analgesic therapy  Outcome: Progressing     Problem: Discharge Barriers  Goal: Patient will be discharged home or other facility with appropriate resources  Outcome: Progressing     Problem: Compromised Tissue integrity  Goal: Damaged tissue is healing and protected  Outcome: Progressing  Goal: Nutritional status is improving  Outcome: Progressing     Problem: Patient Receiving Advanced Renal Therapies  Goal: Therapy access site remains intact  Outcome: Progressing

## 2021-04-30 NOTE — Progress Notes (Signed)
PROGRESS NOTE    Date Time: 04/30/21 8:12 AM  Patient Name: [name], [name]  Patient status: [facility] Day: 0      Assessment:   Reproducible chest pain , left breast swelling status post chest compression x1.  Acute metabolic encephalopathy , s/p  RRT( chest compression x 1 ) , found with hypoxia, head CT negative.  Left breast swelling rib cage x-ray negative, ultrasound with inflammatory changes  Status post mechanical fall , slipped in bathroom injury to lower lip, chin.  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  Hyperkalemia.  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  Surgery consultation requested for possible inflammatory breast disease/Cancer  , Dr.Karzai   Vancomycin until 05/27/2021 (MRSA infection)  On Lantus, sliding scale coverage  On amlodipine Coreg, monitor blood pressure.  Off rocephin nail removed , Vanco with dialysis till 05/27/2021  Dialysis per nephrology  Continue statin.  Monitor H&H and platelet count.  Does Not Use CPAP   Fall precaution, OT PT  DVT prophylaxis  Discharge planning to skilled nursing facility when stable  Labs and data reviewed.  Discussed with patient and nurse  Surgery consultation requested.  Discussed with Dr. Janith Lima  D/w Dr.Karzai   Advised to eat slow will watch for choking and aspiration  Subjective:   Patient seen and examined :  For ultrafiltration today.  Complain of SOB , +ve chest Pain   Patient eats fast had  episodes of food stacking in this throat  Review of Systems:   General ROS:   No fever chills , HD   No fever or chills.  Continue to complain of chest pain.  No shortness of breath, cough, wheezing.  Denies nausea vomiting abdominal pain.  General weakness  Medications:     Current Facility-Administered Medications   Medication Dose Route Frequency    amLODIPine  10 mg Oral Daily    atorvastatin  40 mg Oral Daily    carvedilol  25 mg Oral Q12H SCH    dextrose  50 g Intravenous Once    gabapentin  100 mg Oral Q8H [place]    heparin (porcine)  5,000 Units Subcutaneous Q12H [place]    insulin glargine  40 Units Subcutaneous QHS    insulin lispro  1-4 Units Subcutaneous QHS    insulin lispro  1-8 Units Subcutaneous TID AC    insulin lispro  8 Units Subcutaneous TID AC    petrolatum   Topical Q12H    senna-docusate  2 tablet Oral QHS    sevelamer  800 mg Oral BID Meals    vancomycin   Intravenous See Admin Instructions       acetaminophen **OR** acetaminophen, albuterol-ipratropium, dextrose **OR** dextrose **OR** dextrose **OR** glucagon (rDNA), hydrALAZINE, lactulose, magnesium sulfate, melatonin, naloxone, ondansetron **OR** ondansetron, oxyCODONE-acetaminophen, polyethylene glycol    Physical Exam:   BP 147/67   Pulse 68   Temp 98.4 F (36.9 C) (  Axillary)   Resp 17   Ht 1.575 m (5\' 2" )   Wt 99.9 kg (220 lb 3.8 oz)   LMP  (LMP Unknown) Comment: post menopausal  SpO2 100%   BMI 40.28 kg/m     Intake and Output Summary (Last 24 hours) at Date Time    Intake/Output Summary (Last 24 hours) at 04/30/2021 0812  Last data filed at 04/29/2021 1800  Gross per 24 hour   Intake --   Output 3850 ml   Net -3850 ml         General: Awake alert oriented x3.  Neck: Supple no JVD  ENT: PERRLA, EOMI  Lungs: Coarse breath sounds no wheezing  Heart: Regular rate and rhythm no gallop  Abdomen: Soft nontender bowel sounds positive  Extremities: Warm moist  Neuro: Grossly intact  GU: No CVA tenderness  Hemodialysis dependent  Labs:     CBC  w/Diff CMP   Recent Labs   Lab 04/30/21  0412 04/29/21  1022 04/28/21  1036 04/27/21  0542 04/24/21  0053 04/24/21  0002 04/23/21  1352   WBC 6.68 8.06 7.28  More results in Results Review 6.15 6.44 6.41   Hgb 9.7* 10.8* 11.0*  More results in Results Review 11.3* 10.9* 11.1*   Hematocrit 32.9* 35.9 37.3  More results in Results Review 37.4 35.6 36.2   Platelets 90* 107* 112*  More results in Results Review 98* 94* 103*   MCV 94.8 93.2 96.9*  More results in Results Review 94.7 94.2 94.0   Neutrophils  --   --   --   --  79.6 75.3 70.5   More results in Results Review = values in this interval not displayed.         PT/INR         Recent Labs   Lab 04/30/21  0412 04/29/21  1022 04/28/21  1036 04/24/21  0828 04/24/21  0053 04/23/21  1352   Sodium 139 137 137  More results in Results Review 135 136   Potassium 4.7 4.5 4.4  More results in Results Review 5.8* 5.8*   Chloride 103 102 103  More results in Results Review 100 99   CO2 28 25 24   More results in Results Review 23 23   BUN 40.0* 49.0* 37.0*  More results in Results Review 60.0* 57.0*   Creatinine 4.3* 5.4* 4.7*  More results in Results Review 8.2* 7.6*   Glucose 264* 152* 234*  More results in Results Review 327* 320*   Calcium 8.3* 8.3* 8.0*  More results in Results Review 8.4* 8.4*   Protein, Total  --   --   --   --  6.4 6.5   Albumin  --   --   --   --  2.6* 2.7*   AST (SGOT)  --   --   --   --  24 20   ALT  --   --   --   --  32 31   Alkaline Phosphatase  --   --   --   --  297* 279*   Bilirubin, Total  --   --   --   --  0.3 0.6   More results in Results Review = values in this interval not displayed.        Glucose POCT   Recent Labs   Lab 04/30/21  0412 04/29/21  1022 04/28/21  1036 04/27/21  0542 04/25/21  2246 04/24/21  2119  04/24/21  1223   Glucose 264* 152* 234* 189* 381* 468* 381*          Recent Labs   Lab 04/25/21  2246 04/25/21  1820 04/25/21  0356   hs Troponin-I 72.2* 59.2* 49.8*         Rads:   US Breast Left Ltd    Result Date: 04/29/2021    1. Increased skin thickening and edema as well as increased left axillary lymphadenopathy. Given that these findings predate the patient's fall and are worsening, inflammatory breast cancer must be excluded. Recommend breast surgery consultation and skin punch biopsy. When the patient is an outpatient, diagnostic mammogram and follow-up ultrasound should be performed. BI-RADS: 4, suspicious Earney Navy, MD 04/29/2021 2:53 PM       Aloysuis Ribaudo Felipe Drone, MD  04/30/2021    *This note was generated by the Epic EMR system/ Dragon speech recognition and may contain inherent errors or omissions not intended by the user. Grammatical errors, random word insertions, deletions, pronoun errors and incomplete sentences are occasional consequences of this technology due to software limitations. Not all errors are caught or corrected. If there are questions or concerns about the content of this note or information contained within the body of this dictation they should be addressed directly with the author for clarification

## 2021-04-30 NOTE — Progress Notes (Signed)
[facility] PROGRESS NOTE  [id number]    Date Time: 04/30/21 6:48 PM  Patient Name: [name]  Attending Physician: Barbaraann Faster, MD    CC: follow-up ESRD  Assessment:     ESRD on HD MWF at [facility] - followed by Dr. Randel Pigg  Hyperkalemia due to missed HD & hyperglycemia- last HD on 3/29, 4/2  Mild hypocalcemia due to CKD  Anemia in CKD, hgb at goal  HTN - controlled  DM type 2 with hyperglycemia  Hyperphosphatemia: PTH 1579 on 3/23  Secondary hyperparathyroidism  Fall   Abdominal wall abscess MRSA on vancomycin     Recommendations:     No urgent needs for RRT today  Asked to avoid eating too much Pope[place] (R)    Case discussed with: RN, pt    Yelina Sarratt Oretha Ellis, MD  [facility]  [phone] (office)    Subjective:     NC 3L Remains on supplemental oxygen  Chest pain from fall vs. Concern for breast cancer  No new complains    Review of Systems:     No cp  No sob  R Toe pain    Physical Exam:     Vitals:    04/30/21 0852 04/30/21 1144 04/30/21 1537 04/30/21 1617   BP: 146/66 120/58  134/59   Pulse: 64 67 69 65   Resp:  17 (!) 38 20   Temp:  98.7 F (37.1 C)  98.6 F (37 C)   TempSrc:  Axillary  Oral   SpO2:  98% (!) 88% 98%   Weight:    99.8 kg (220 lb)   Height:         Intake and Output Summary (Last 24 hours) at Date Time    Intake/Output Summary (Last 24 hours) at 04/30/2021 1848  Last data filed at 04/30/2021 0845  Gross per 24 hour   Intake 118 ml   Output --   Net 118 ml       General: awake, alert, oriented x 3, no acute distress  Cardiovascular: regular rate and rhythm, no murmurs, rubs or gallops  Lungs: clear to auscultation bilaterally, without wheezing, rhonchi, or rales  Abdomen: soft, non-tender, non-distended, normoactive bowel sounds  Extremities: no clubbing, cyanosis, or edema    Meds:      Scheduled Meds: PRN Meds:    amLODIPine, 10 mg, Oral, Daily  atorvastatin, 40 mg, Oral, Daily  carvedilol, 25 mg, Oral, Q12H SCH  dextrose, 50 g, Intravenous,  Once  gabapentin, 100 mg, Oral, Q8H SCH  heparin (porcine), 5,000 Units, Subcutaneous, Q12H SCH  insulin glargine, 40 Units, Subcutaneous, QHS  insulin lispro, 1-4 Units, Subcutaneous, QHS  insulin lispro, 1-8 Units, Subcutaneous, TID AC  insulin lispro, 8 Units, Subcutaneous, TID AC  petrolatum, , Topical, Q12H  senna-docusate, 2 tablet, Oral, QHS  sevelamer, 800 mg, Oral, BID Meals  vancomycin, , Intravenous, See Admin Instructions          Continuous Infusions:   acetaminophen, 650 mg, Q6H PRN   Or  acetaminophen, 650 mg, Q6H PRN  albuterol-ipratropium, 3 mL, Q6H PRN  dextrose, 15 g of glucose, PRN   Or  dextrose, 12.5 g, PRN   Or  dextrose, 12.5 g, PRN   Or  glucagon (rDNA), 1 mg, PRN  hydrALAZINE, 10 mg, Q6H PRN  lactulose, 10 g, Daily PRN  magnesium sulfate, 1 g, PRN  melatonin, 3 mg, QHS PRN  naloxone, 0.2 mg, PRN  ondansetron, 4 mg, Q6H  PRN   Or  ondansetron, 4 mg, Q6H PRN  oxyCODONE-acetaminophen, 1 tablet, Q4H PRN  polyethylene glycol, 17 g, Daily PRN          Labs:     Recent Labs   Lab 04/30/21  0412 04/29/21  1022 04/28/21  1036   WBC 6.68 8.06 7.28   Hgb 9.7* 10.8* 11.0*   Hematocrit 32.9* 35.9 37.3   Platelets 90* 107* 112*       Recent Labs   Lab 04/30/21  0412 04/29/21  1022 04/28/21  1036 04/24/21  0828 04/24/21  0053   Sodium 139 137 137  More results in Results Review 135   Potassium 4.7 4.5 4.4  More results in Results Review 5.8*   Chloride 103 102 103  More results in Results Review 100   CO2 28 25 24   More results in Results Review 23   BUN 40.0* 49.0* 37.0*  More results in Results Review 60.0*   Creatinine 4.3* 5.4* 4.7*  More results in Results Review 8.2*   Calcium 8.3* 8.3* 8.0*  More results in Results Review 8.4*   Albumin  --   --   --   --  2.6*   Glucose 264* 152* 234*  More results in Results Review 327*   EGFR 12.5 9.6 11.2  More results in Results Review 5.9   More results in Results Review = values in this interval not displayed.             Invalid input(s): LEUKOCYTESUR       Imaging personally reviewed, including: No results found.    Signed by: Renard Hamper, MD

## 2021-04-30 NOTE — Progress Notes (Signed)
Breast Surgery Note    Discussed patient with Dr. Bunnie Pion and chart reviewed remotely. 51F with multiple comobordities including ESRD on HD, secondary HPTH, admitted after a mechanical fall with acute metabolic encephalopathy. Short round of chest compressions at presentation. She was noted to have left breast erythema and edema suggestive of peau de orange, concerning for inflammatory breast cancer. She has already completed a course of ceftriaxone, and is currently on vancomycin. [place] reviewed- skin thickening, edema, left axillary lymphadenopathy.     My office will call the patient to schedule an appointment.     Philipp Ovens, MD  Breast and Endocrine Surgeon   [facility] (breast): [phone]  [facility] (endocrine): [phone]

## 2021-04-30 NOTE — Progress Notes (Signed)
PULMONARY PROGRESS NOTE                                                                                                              [phone]    Date Time: 04/30/21 12:19 PM  Patient Name: [name], [name] 66 y.o. female admitted with Altered mental status  Admit Date: 04/23/2021    Patient status: Observation  Hospital Day: 0           Assessment:     Subcentimeter pulmonary nodules  Obstructive sleep apnea  End-stage renal disease  Swelling of the left breast suspicious for malignancy  Hypertension  Hyperlipidemia  Plan:   Consulted breast surgeon outpatient biopsy recommended  Dialysis as per nephrology  Nocturnal CPAP  Glycemic control  Follow pulmonary nodule outpatient  Off antibiotics  DVT prophylaxis    Subjective:           Patient seen and examined no new complaints noted  Medications:     Current Facility-Administered Medications   Medication Dose Route Frequency    amLODIPine  10 mg Oral Daily    atorvastatin  40 mg Oral Daily    carvedilol  25 mg Oral Q12H [place]    dextrose  50 g Intravenous Once    gabapentin  100 mg Oral Q8H [place]    heparin (porcine)  5,000 Units Subcutaneous Q12H [place]    insulin glargine  40 Units Subcutaneous QHS    insulin lispro  1-4 Units Subcutaneous QHS    insulin lispro  1-8 Units Subcutaneous TID AC    insulin lispro  8 Units Subcutaneous TID AC    petrolatum   Topical Q12H    senna-docusate  2 tablet Oral QHS    sevelamer  800 mg Oral BID Meals    vancomycin   Intravenous See Admin Instructions       Review of Systems:        General ROS: negative for - chills, fever or night sweats  Ophthalmic ROS: negative for - double vision, excessive tearing, itchy eyes or photophobia  ENT ROS: negative for - headaches, nasal congestion, sore throat or vertigo  Respiratory ROS: Pain in the left breast  Cardiovascular ROS: negative for - chest pain, edema, orthopnea, rapid heart rate  Gastrointestinal ROS: negative for - abdominal pain, blood in stools, constipation, diarrhea, heartburn  or nausea/vomiting  Musculoskeletal ROS: negative for - muscle pain  Neurological ROS: negative for - confusion, dizziness, headaches, speech problems, visual changes or weakness  Dermatological ROS: negative for dry skin, pruritus and rash      Physical Exam:     Vitals:    04/30/21 1144   BP: 120/58   Pulse: 67   Resp: 17   Temp: 98.7 F (37.1 C)   SpO2: 98%         Intake/Output Summary (Last 24 hours) at 04/30/2021 1219  Last data filed at 04/30/2021 0845  Gross per 24 hour   Intake 118 ml   Output 3850 ml   Net -3732 ml  General appearance - alert,  and in no distress  Mental status - alert, oriented to person, place, and time  Eyes - pupils equal and reactive, extraocular eye movements intact  Ears - no external ear lesions seen  Nose - no nasal discharge   Mouth - clear oral mucosa, moist   Neck - supple, no significant adenopathy  Chest -clear to auscultation.  Left breast is tender and swollen  Heart - normal rate, regular rhythm, normal S1, S2, no murmurs, rubs, clicks or gallops  Abdomen - soft, nontender, nondistended, no masses or organomegaly  Neurological - alert, oriented, normal speech, no focal findings or movement disorder noted  Musculoskeletal - no joint swelling or tenderness  Extremities -  no pedal edema, no clubbing or cyanosis  Skin - normal coloration, no rashes      Labs:     CBC w/Diff CMP   Recent Labs   Lab 04/30/21  0412 04/29/21  1022 04/28/21  1036 04/27/21  0542 04/24/21  0053 04/24/21  0002 04/23/21  1352   WBC 6.68 8.06 7.28  More results in Results Review 6.15 6.44 6.41   Hgb 9.7* 10.8* 11.0*  More results in Results Review 11.3* 10.9* 11.1*   Hematocrit 32.9* 35.9 37.3  More results in Results Review 37.4 35.6 36.2   Platelets 90* 107* 112*  More results in Results Review 98* 94* 103*   MCV 94.8 93.2 96.9*  More results in Results Review 94.7 94.2 94.0   Neutrophils  --   --   --   --  79.6 75.3 70.5   More results in Results Review = values in this interval not  displayed.         PT/INR         Recent Labs   Lab 04/30/21  0412 04/29/21  1022 04/28/21  1036 04/24/21  0828 04/24/21  0053 04/23/21  1352   Sodium 139 137 137  More results in Results Review 135 136   Potassium 4.7 4.5 4.4  More results in Results Review 5.8* 5.8*   Chloride 103 102 103  More results in Results Review 100 99   CO2 28 25 24   More results in Results Review 23 23   BUN 40.0* 49.0* 37.0*  More results in Results Review 60.0* 57.0*   Creatinine 4.3* 5.4* 4.7*  More results in Results Review 8.2* 7.6*   Glucose 264* 152* 234*  More results in Results Review 327* 320*   Calcium 8.3* 8.3* 8.0*  More results in Results Review 8.4* 8.4*   Protein, Total  --   --   --   --  6.4 6.5   Albumin  --   --   --   --  2.6* 2.7*   AST (SGOT)  --   --   --   --  24 20   ALT  --   --   --   --  32 31   Alkaline Phosphatase  --   --   --   --  297* 279*   Bilirubin, Total  --   --   --   --  0.3 0.6   More results in Results Review = values in this interval not displayed.        Glucose POCT   Recent Labs   Lab 04/30/21  0412 04/29/21  1022 04/28/21  1036 04/27/21  0542 04/25/21  2246 04/24/21  2119 04/24/21  1223   Glucose  264* 152* 234* 189* 381* 468* 381*          Recent Labs   Lab 04/25/21  2246 04/25/21  1820 04/25/21  0356   hs Troponin-I 72.2* 59.2* 49.8*           ABGs:    ABG CollectionSite   Date Value Ref Range Status   04/23/2021 Right Radl  Final     Allen's Test   Date Value Ref Range Status   04/23/2021 Yes  Final     pH, Arterial   Date Value Ref Range Status   04/23/2021 7.242 (L) 7.350 - 7.450 Final     pCO2, Arterial   Date Value Ref Range Status   04/23/2021 59.5 (H) 35.0 - 45.0 mmhg Final     pO2, Arterial   Date Value Ref Range Status   04/23/2021 <42.7 (L) 80.0 - 90.0 mmhg Final     Comment:     Critical pO2 results called to [id number].  Readback confirmed by MM at  04/23/2021  22:37       HCO3, Arterial   Date Value Ref Range Status   04/23/2021 24.7 23.0 - 29.0 mEq/L Final     Base Excess,  Arterial   Date Value Ref Range Status   04/23/2021 -3.0 (L) -2.0 - 2.0 mEq/L Final     O2 Sat, Arterial   Date Value Ref Range Status   04/23/2021 45.3 (L) 95.0 - 100.0 % Final       Urinalysis        Invalid input(s): LEUKOCYTESUR      Rads:   No results found.      Quillian Quince MD  04/30/2021  12:19 PM

## 2021-05-01 LAB — GLUCOSE WHOLE BLOOD - POCT
Whole Blood Glucose POCT: 98 mg/dL (ref 70–100)
Whole Blood Glucose POCT: 140 mg/dL — ABNORMAL HIGH (ref 70–100)
Whole Blood Glucose POCT: 100 mg/dL (ref 70–100)
Whole Blood Glucose POCT: 224 mg/dL — ABNORMAL HIGH (ref 70–100)

## 2021-05-01 NOTE — Progress Notes (Signed)
PULMONARY PROGRESS NOTE                                                                                                              [phone]    Date Time: 05/01/21 8:10 PM  Patient Name: [name], [name] 66 y.o. female admitted with Altered mental status  Admit Date: 04/23/2021    Patient status: Observation  Hospital Day: 0           Assessment:     Subcentimeter pulmonary nodules  Obstructive sleep apnea  End-stage renal disease  Swelling of the left breast suspicious for malignancy  Hypertension  Hyperlipidemia  Plan:   Consulted breast surgeon outpatient biopsy recommended  Dialysis as per nephrology  Nocturnal CPAP  Glycemic control  Follow pulmonary nodule outpatient, repeat CT scan in 3 to 6 months  Off antibiotics  DVT prophylaxis    Subjective:     Patient seen and examined no new complaints noted      Medications:     Current Facility-Administered Medications   Medication Dose Route Frequency    amLODIPine  10 mg Oral Daily    atorvastatin  40 mg Oral Daily    carvedilol  25 mg Oral Q12H SCH    dextrose  50 g Intravenous Once    gabapentin  100 mg Oral Q8H [place]    heparin (porcine)  5,000 Units Subcutaneous Q12H [place]    insulin glargine  40 Units Subcutaneous QHS    insulin lispro  1-4 Units Subcutaneous QHS    insulin lispro  1-8 Units Subcutaneous TID AC    insulin lispro  8 Units Subcutaneous TID AC    petrolatum   Topical Q12H    senna-docusate  2 tablet Oral QHS    sevelamer  800 mg Oral BID Meals    vancomycin   Intravenous See Admin Instructions       Review of Systems:        General ROS: negative for - chills, fever or night sweats  Ophthalmic ROS: negative for - double vision, excessive tearing, itchy eyes or photophobia  ENT ROS: negative for - headaches, nasal congestion, sore throat or vertigo  Respiratory ROS: Pain in the left breast  Cardiovascular ROS: negative for - chest pain, edema, orthopnea, rapid heart rate  Gastrointestinal ROS: negative for - abdominal pain, blood in stools,  constipation, diarrhea, heartburn or nausea/vomiting  Musculoskeletal ROS: negative for - muscle pain  Neurological ROS: negative for - confusion, dizziness, headaches, speech problems, visual changes or weakness  Dermatological ROS: negative for dry skin, pruritus and rash      Physical Exam:     Vitals:    05/01/21 1906   BP: 145/65   Pulse: 65   Resp: 19   Temp: 97.8 F (36.6 C)   SpO2:          Intake/Output Summary (Last 24 hours) at 05/01/2021 2010  Last data filed at 05/01/2021 1800  Gross per 24 hour   Intake 1520 ml   Output --  Net 1520 ml            General appearance - alert,  and in no distress  Mental status - alert, oriented to person, place, and time  Eyes - pupils equal and reactive, extraocular eye movements intact  Ears - no external ear lesions seen  Nose - no nasal discharge   Mouth - clear oral mucosa, moist   Neck - supple, no significant adenopathy  Chest -clear to auscultation.  Left breast is tender and swollen  Heart - normal rate, regular rhythm, normal S1, S2, no murmurs, rubs, clicks or gallops  Abdomen - soft, nontender, nondistended, no masses or organomegaly  Neurological - alert, oriented, normal speech, no focal findings or movement disorder noted  Musculoskeletal - no joint swelling or tenderness  Extremities -  no pedal edema, no clubbing or cyanosis  Skin - normal coloration, no rashes      Labs:     CBC w/Diff CMP   Recent Labs   Lab 04/30/21  0412 04/29/21  1022 04/28/21  1036   WBC 6.68 8.06 7.28   Hgb 9.7* 10.8* 11.0*   Hematocrit 32.9* 35.9 37.3   Platelets 90* 107* 112*   MCV 94.8 93.2 96.9*         PT/INR         Recent Labs   Lab 04/30/21  0412 04/29/21  1022 04/28/21  1036   Sodium 139 137 137   Potassium 4.7 4.5 4.4   Chloride 103 102 103   CO2 28 25 24    BUN 40.0* 49.0* 37.0*   Creatinine 4.3* 5.4* 4.7*   Glucose 264* 152* 234*   Calcium 8.3* 8.3* 8.0*        Glucose POCT   Recent Labs   Lab 04/30/21  0412 04/29/21  1022 04/28/21  1036 04/27/21  0542 04/25/21  2246  04/24/21  2119   Glucose 264* 152* 234* 189* 381* 468*          Recent Labs   Lab 04/25/21  2246 04/25/21  1820 04/25/21  0356   hs Troponin-I 72.2* 59.2* 49.8*           ABGs:    ABG CollectionSite   Date Value Ref Range Status   04/23/2021 Right Radl  Final     Allen's Test   Date Value Ref Range Status   04/23/2021 Yes  Final     pH, Arterial   Date Value Ref Range Status   04/23/2021 7.242 (L) 7.350 - 7.450 Final     pCO2, Arterial   Date Value Ref Range Status   04/23/2021 59.5 (H) 35.0 - 45.0 mmhg Final     pO2, Arterial   Date Value Ref Range Status   04/23/2021 <42.7 (L) 80.0 - 90.0 mmhg Final     Comment:     Critical pO2 results called to [id number].  Readback confirmed by MM at  04/23/2021  22:37       HCO3, Arterial   Date Value Ref Range Status   04/23/2021 24.7 23.0 - 29.0 mEq/L Final     Base Excess, Arterial   Date Value Ref Range Status   04/23/2021 -3.0 (L) -2.0 - 2.0 mEq/L Final     O2 Sat, Arterial   Date Value Ref Range Status   04/23/2021 45.3 (L) 95.0 - 100.0 % Final       Urinalysis        Invalid input(s): LEUKOCYTESUR  Rads:   No results found.      Quillian Quince MD  05/01/2021  8:10 PM

## 2021-05-01 NOTE — UM Notes (Signed)
Continued Stay Review  Class: Observation      Patient Name: [name], [name]   Date of Birth September 23, 1955       Summary:  The patient is a 66 year old female with a history of "COPD, type 2 diabetes mellitus, hypertension, hyperlipidemia, COPD, ESRD-DD MWF presented to the emergency room after a fall.  Patient having left chest pain contusion of the belly and right foot."    Care Day - 04/30/2021      Vitals:      Weight:  101.9 kg        Labs:  Comprehensive Metabolic:  Glucose: 287  BUN:  40.0  Creatinine: 4.3  Calcium: 8.3      CBC and Differential:  Hgb:  9.7  Hct: 32.9  Platelets: 90          Medications:  Current Facility-Administered Medications   Medication Dose Route Frequency    amLODIPine  10 mg Oral Daily    atorvastatin  40 mg Oral Daily    carvedilol  25 mg Oral Q12H SCH    dextrose  50 g Intravenous Once    gabapentin  100 mg Oral Q8H [place]    heparin (porcine)  5,000 Units Subcutaneous Q12H [place]    insulin glargine  40 Units Subcutaneous QHS    insulin lispro  1-4 Units Subcutaneous QHS    insulin lispro  1-8 Units Subcutaneous TID AC    insulin lispro  8 Units Subcutaneous TID AC    petrolatum   Topical Q12H    senna-docusate  2 tablet Oral QHS    sevelamer  800 mg Oral BID Meals    vancomycin   Intravenous See Admin Instructions       PRN Medications:            Plan of Care:   -  HD per Nephrology  -  Nephrology following  -  Pulmonology following  -  Breast Surgery consult  - Telemetry monitoring  -  Pt/OT    ______________________________________________________________________      Care Day - 05/01/2021      Vitals:      Weight: 101.2 kg        Medications:  Current Facility-Administered Medications   Medication Dose Route Frequency    amLODIPine  10 mg Oral Daily    atorvastatin  40 mg Oral Daily    carvedilol  25 mg Oral Q12H SCH    dextrose  50 g Intravenous Once    gabapentin  100 mg Oral Q8H [place]    heparin (porcine)  5,000 Units Subcutaneous Q12H [place]    insulin glargine  40 Units Subcutaneous  QHS    insulin lispro  1-4 Units Subcutaneous QHS    insulin lispro  1-8 Units Subcutaneous TID AC    insulin lispro  8 Units Subcutaneous TID AC    petrolatum   Topical Q12H    senna-docusate  2 tablet Oral QHS    sevelamer  800 mg Oral BID Meals    vancomycin   Intravenous See Admin Instructions         Plan of Care:   -  HD per Nephrology  -  Nephrology following  -  Pulmonology following  -  Breast Surgery consult  - Telemetry monitoring  -  Pt/OT        Melissa Montane RN, BSN  UR Case Manager  [email or web address]  Phone: [phone]  Fax: [phone]  3:40 PM  05/01/2021          UTILIZATION REVIEW CONTACT: Name:  Melissa Montane, RN BSN  Utilization Review  [facility]  Address:  [address] [place], [place] [address]  NPI:   [id number]  Tax ID:  [id number]  Phone: [phone], Option 2  Fax:  [phone]    Please use fax number [phone] to provide authorization for hospital services or to request additional information.        NOTES TO REVIEWER:    This clinical review is based on/compiled from documentation provided by the treatment team within the patient's medical record.

## 2021-05-01 NOTE — Progress Notes (Signed)
[facility] PROGRESS NOTE  [id number]    Date Time: 05/01/21 9:55 PM  Patient Name: [name]  Attending Physician: Barbaraann Faster, MD    CC: follow-up ESRD  Assessment:     ESRD on HD MWF at [facility] - followed by Dr. Randel Pigg  Hyperkalemia due to missed HD & hyperglycemia- last HD on 3/29, 4/2  Mild hypocalcemia due to CKD  Anemia in CKD, hgb at goal  HTN - controlled  DM type 2 with hyperglycemia  Hyperphosphatemia: PTH 1579 on 3/23  Secondary hyperparathyroidism  Fall   Abdominal wall abscess MRSA on vancomycin     Recommendations:     HD tmrw  Plan for d/c mid week    Case discussed with: pt    Senovia Gauer Oretha Ellis, MD  [facility]  [id number] (office)    Subjective:     NC 3L Remains on supplemental oxygen  Chest pain from fall vs. Concern for breast cancer  No new complains    Review of Systems:     No cp  No sob  R Toe pain    Physical Exam:     Vitals:    05/01/21 1500 05/01/21 1648 05/01/21 1906 05/01/21 2105   BP:  148/65 145/65 166/72   Pulse:  62 65 63   Resp: 13 20 19     Temp:  97.9 F (36.6 C) 97.8 F (36.6 C)    TempSrc:  Axillary Oral    SpO2:  99%     Weight:       Height:         Intake and Output Summary (Last 24 hours) at Date Time    Intake/Output Summary (Last 24 hours) at 05/01/2021 2155  Last data filed at 05/01/2021 1800  Gross per 24 hour   Intake 1520 ml   Output --   Net 1520 ml       General: awake, alert, oriented x 3, no acute distress  Cardiovascular: regular rate and rhythm, no murmurs, rubs or gallops  Lungs: clear to auscultation bilaterally, without wheezing, rhonchi, or rales  Abdomen: soft, non-tender, non-distended, normoactive bowel sounds  Extremities: no clubbing, cyanosis, or edema    Meds:      Scheduled Meds: PRN Meds:    amLODIPine, 10 mg, Oral, Daily  atorvastatin, 40 mg, Oral, Daily  carvedilol, 25 mg, Oral, Q12H SCH  dextrose, 50 g, Intravenous, Once  gabapentin, 100 mg, Oral, Q8H SCH  heparin (porcine), 5,000 Units,  Subcutaneous, Q12H SCH  insulin glargine, 40 Units, Subcutaneous, QHS  insulin lispro, 1-4 Units, Subcutaneous, QHS  insulin lispro, 1-8 Units, Subcutaneous, TID AC  insulin lispro, 8 Units, Subcutaneous, TID AC  petrolatum, , Topical, Q12H  senna-docusate, 2 tablet, Oral, QHS  sevelamer, 800 mg, Oral, BID Meals  vancomycin, , Intravenous, See Admin Instructions          Continuous Infusions:   acetaminophen, 650 mg, Q6H PRN   Or  acetaminophen, 650 mg, Q6H PRN  albuterol-ipratropium, 3 mL, Q6H PRN  dextrose, 15 g of glucose, PRN   Or  dextrose, 12.5 g, PRN   Or  dextrose, 12.5 g, PRN   Or  glucagon (rDNA), 1 mg, PRN  hydrALAZINE, 10 mg, Q6H PRN  lactulose, 10 g, Daily PRN  magnesium sulfate, 1 g, PRN  melatonin, 3 mg, QHS PRN  naloxone, 0.2 mg, PRN  ondansetron, 4 mg, Q6H PRN   Or  ondansetron, 4 mg, Q6H PRN  oxyCODONE-acetaminophen, 1  tablet, Q4H PRN  polyethylene glycol, 17 g, Daily PRN          Labs:     Recent Labs   Lab 04/30/21  0412 04/29/21  1022 04/28/21  1036   WBC 6.68 8.06 7.28   Hgb 9.7* 10.8* 11.0*   Hematocrit 32.9* 35.9 37.3   Platelets 90* 107* 112*       Recent Labs   Lab 04/30/21  0412 04/29/21  1022 04/28/21  1036   Sodium 139 137 137   Potassium 4.7 4.5 4.4   Chloride 103 102 103   CO2 28 25 24    BUN 40.0* 49.0* 37.0*   Creatinine 4.3* 5.4* 4.7*   Calcium 8.3* 8.3* 8.0*   Glucose 264* 152* 234*   EGFR 12.5 9.6 11.2             Invalid input(s): LEUKOCYTESUR      Imaging personally reviewed, including: No results found.    Signed by: Renard Hamper, MD

## 2021-05-01 NOTE — Plan of Care (Signed)
---  Night Shift-Unit 28  Neuro: A/Ox 4 , Calm/Follows commands     Cardio: NSR on the monitor, HR between  50-90 throughout the night      Pulm: On 3L/Cpap @HS , SpO2 at  99%    GI: Consistent Carb Heart Healthy Diet,   Soft Abd, No nausea/vomiting reported.     GU: No BM, External Cath present, Peri-care done    Skin: Dry/Warm    Musk: PMP Activity: bed mobility   BUE: Full mobility    BLE: Limited mobility      ---Events: No significant events during the night.      Meds:   Given PO    Fall risk precautions in place, bedside table and call light within reach.   Pt resting comfortably in bed at end of shift.       ---Care Plan  Problem: Moderate/High Fall Risk Score >5  Goal: Patient will remain free of falls  Outcome: Progressing     Problem: Fluid and Electrolyte Imbalance/ Endocrine  Goal: Fluid and electrolyte balance are achieved/maintained  Outcome: Progressing  Goal: Adequate hydration  Outcome: Progressing     Problem: Nutrition  Goal: Nutritional intake is adequate  Outcome: Progressing  Goal: Patient maintains weight  Outcome: Progressing  Goal: Food and/or nutrient delivery  Outcome: Progressing     Problem: Diabetes: Glucose Imbalance  Goal: Blood glucose stable at established goal  Outcome: Progressing     Problem: Potential for Aspiration  Goal: Risk of aspiration will be minimized  Outcome: Progressing     Problem: Renal Instability  Goal: Nutritional intake is adequate  Outcome: Progressing  Goal: Fluid and electrolyte balance are achieved/maintained  Outcome: Progressing  Goal: Free from infection  Outcome: Progressing     Problem: Safety  Goal: Patient will be free from injury during hospitalization  Outcome: Progressing  Goal: Patient will be free from infection during hospitalization  Outcome: Progressing     Problem: Pain  Goal: Pain at adequate level as identified by patient  Outcome: Progressing     Problem: Side Effects from Pain Analgesia  Goal: Patient will experience minimal side effects of  analgesic therapy  Outcome: Progressing     Problem: Discharge Barriers  Goal: Patient will be discharged home or other facility with appropriate resources  Outcome: Progressing     Problem: Compromised Tissue integrity  Goal: Damaged tissue is healing and protected  Outcome: Progressing  Goal: Nutritional status is improving  Outcome: Progressing     Problem: Patient Receiving Advanced Renal Therapies  Goal: Therapy access site remains intact  Outcome: Progressing

## 2021-05-01 NOTE — Plan of Care (Signed)
Problem: Moderate/High Fall Risk Score >5  Goal: Patient will remain free of falls  Outcome: Progressing  Flowsheets (Taken 05/01/2021 0800)  High (Greater than 13):   HIGH-Initiate use of floor mats as appropriate   HIGH-Bed alarm on at all times while patient in bed   HIGH-Apply yellow "Fall Risk" arm band     Problem: Fluid and Electrolyte Imbalance/ Endocrine  Goal: Fluid and electrolyte balance are achieved/maintained  Outcome: Progressing  Flowsheets (Taken 05/01/2021 0215 by Orson Aloe, RN)  Fluid and electrolyte balance are achieved/maintained:   Monitor intake and output every shift   Observe for seizure activity and initiate seizure precautions if indicated   Assess for confusion/personality changes   Monitor/assess lab values and report abnormal values   Monitor daily weight   Assess and reassess fluid and electrolyte status   Observe for cardiac arrhythmias   Monitor for muscle weakness   Provide adequate hydration     Problem: Nutrition  Goal: Nutritional intake is adequate  Outcome: Progressing  Flowsheets (Taken 05/01/2021 1105)  Nutritional intake is adequate:   Monitor daily weights   Allow adequate time for meals   Assist patient with meals/food selection   Encourage/perform oral hygiene as appropriate     Problem: Diabetes: Glucose Imbalance  Goal: Blood glucose stable at established goal  Outcome: Progressing  Flowsheets (Taken 05/01/2021 0215 by Orson Aloe, RN)  Blood glucose stable at established goal:   Monitor lab values   Include patient/family in decisions related to nutrition/dietary selections   Coordinate medication administration with meals, as indicated   Ensure appropriate consults are obtained (Nutrition, Diabetes Education, and Case Management)   Monitor intake and output.  Notify LIP if urine output is < 30 mL/hour.   Ensure appropriate diet and assess tolerance   Assess for hypoglycemia /hyperglycemia   Follow fluid restrictions/IV/PO parameters   Monitor/assess vital  signs   Ensure adequate hydration   Ensure patient/family has adequate teaching materials     Problem: Renal Instability  Goal: Nutritional intake is adequate  Outcome: Progressing  Flowsheets (Taken 05/01/2021 1105)  Nutritional intake is adequate:   Monitor daily weights   Allow adequate time for meals   Assist patient with meals/food selection   Encourage/perform oral hygiene as appropriate  Goal: Fluid and electrolyte balance are achieved/maintained  Outcome: Progressing  Flowsheets (Taken 05/01/2021 1105)  Fluid and electrolyte balance are achieved/maintained:   Monitor intake and output every shift   Monitor/assess lab values and report abnormal values   Provide adequate hydration   Assess for confusion/personality changes   Observe for cardiac arrhythmias   Monitor for muscle weakness     Problem: Safety  Goal: Patient will be free from injury during hospitalization  Outcome: Progressing  Flowsheets (Taken 05/01/2021 1105)  Patient will be free from injury during hospitalization:   Assess patient's risk for falls and implement fall prevention plan of care per policy   Use appropriate transfer methods   Hourly rounding   Ensure appropriate safety devices are available at the bedside   Provide and maintain safe environment   Include patient/ family/ care giver in decisions related to safety  Goal: Patient will be free from infection during hospitalization  Outcome: Progressing  Flowsheets (Taken 05/01/2021 1105)  Free from Infection during hospitalization:   Assess and monitor for signs and symptoms of infection   Monitor lab/diagnostic results   Monitor all insertion sites (i.e. indwelling lines, tubes, urinary catheters, and drains)   Encourage  patient and family to use good hand hygiene technique

## 2021-05-01 NOTE — Progress Notes (Addendum)
D/C order noted.  PT recommending SNF. CM made referrals for SNFs via [employer] and included [facility]. Patient is on HD MWF.     3:57 PM : CM spoke to patient  at bedside who is agreeing to SNF. CM discussed [facility] and [facility] facilities with on site dialysis.Patient stated that she does not have any preference. CM called husband and left voicemail message.  Covering CM to f/u with facilities to confirm bed availability and f/u with dialysis coordinator.         4:07 PM : Received call from husband who confirmed [facility] as first choice. Covering CM to f/u with facility  to confirm bed availability and f/u with dialysis coordinator.CM notified on call PT Kyle of STAT PT/OT needed for  SNF auth.

## 2021-05-01 NOTE — Progress Notes (Signed)
PROGRESS NOTE    Date Time: 05/01/21 9:43 AM  Patient Name: [name], [name]  Patient status: [facility] Day: 0      Assessment:   Reproducible chest pain , left breast swelling status post chest compression x1.  Acute metabolic encephalopathy , s/p  RRT( chest compression x 1 ) , found with hypoxia, head CT negative.  Left breast swelling rib cage x-ray negative, ultrasound with inflammatory changes  Status post mechanical fall , slipped in bathroom injury to lower lip, chin.  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  Hyperkalemia.  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  Appreciate surgery input patient to follow-up with breast surgery after discharge.  Vancomycin until 05/27/2021 (MRSA infection)  On Lantus, sliding scale coverage  On amlodipine Coreg, monitor blood pressure.  Off rocephin nail removed , Vanco with dialysis till 05/27/2021  Dialysis per nephrology  Continue statin.  Monitor H&H and platelet count.  Does Not Use CPAP   Fall precaution, OT PT  DVT prophylaxis  Discharge planning when cleared by others - SNF   Labs and data reviewed.  Discussed with patient and nurse  Discussed with Dr. Janith Lima  Had D/w Dr.Karzai   [id number] , Case manger for DCP   Subjective:   Patient seen and examined :  Looks comfortable   +ve Chest Pain   Review of Systems:   General ROS:   No fever chills , HD   Continue to complain of chest pain.  No shortness of breath, cough,  wheezing.  Denies nausea vomiting abdominal pain.  General weakness  Medications:     Current Facility-Administered Medications   Medication Dose Route Frequency    amLODIPine  10 mg Oral Daily    atorvastatin  40 mg Oral Daily    carvedilol  25 mg Oral Q12H SCH    dextrose  50 g Intravenous Once    gabapentin  100 mg Oral Q8H [place]    heparin (porcine)  5,000 Units Subcutaneous Q12H [place]    insulin glargine  40 Units Subcutaneous QHS    insulin lispro  1-4 Units Subcutaneous QHS    insulin lispro  1-8 Units Subcutaneous TID AC    insulin lispro  8 Units Subcutaneous TID AC    petrolatum   Topical Q12H    senna-docusate  2 tablet Oral QHS    sevelamer  800 mg Oral BID Meals    vancomycin   Intravenous See Admin Instructions       acetaminophen **OR** acetaminophen, albuterol-ipratropium, dextrose **OR** dextrose **OR** dextrose **OR** glucagon (rDNA), hydrALAZINE, lactulose, magnesium sulfate, melatonin, naloxone, ondansetron **OR** ondansetron, oxyCODONE-acetaminophen, polyethylene glycol    Physical Exam:   BP 180/84   Pulse 64   Temp 97.7 F (36.5 C) (Oral)   Resp 22   Ht 1.575 m (5\' 2" )   Wt 101.2 kg (223 lb 1.7 oz)   LMP  (LMP Unknown)  Comment: post menopausal  SpO2 95%   BMI 40.81 kg/m     Intake and Output Summary (Last 24 hours) at Date Time    Intake/Output Summary (Last 24 hours) at 05/01/2021 0943  Last data filed at 04/30/2021 2300  Gross per 24 hour   Intake 300 ml   Output --   Net 300 ml         General: Awake alert oriented x3.  Neck: Supple no JVD  ENT: PERRLA, EOMI  Lungs: Clear to auscultation no crackles no wheezing  Heart: Regular rate and rhythm no gallop .  Abdomen: Soft nontender bowel sounds positive  Extremities: Warm moist  Neuro: Grossly intact  GU: No CVA tenderness  Hemodialysis dependent  Labs:     CBC w/Diff CMP   Recent Labs   Lab 04/30/21  0412 04/29/21  1022 04/28/21  1036   WBC 6.68 8.06 7.28   Hgb 9.7* 10.8* 11.0*   Hematocrit 32.9* 35.9 37.3   Platelets 90* 107* 112*   MCV  94.8 93.2 96.9*         PT/INR         Recent Labs   Lab 04/30/21  0412 04/29/21  1022 04/28/21  1036   Sodium 139 137 137   Potassium 4.7 4.5 4.4   Chloride 103 102 103   CO2 28 25 24    BUN 40.0* 49.0* 37.0*   Creatinine 4.3* 5.4* 4.7*   Glucose 264* 152* 234*   Calcium 8.3* 8.3* 8.0*        Glucose POCT   Recent Labs   Lab 04/30/21  0412 04/29/21  1022 04/28/21  1036 04/27/21  0542 04/25/21  2246 04/24/21  2119 04/24/21  1223   Glucose 264* 152* 234* 189* 381* 468* 381*          Recent Labs   Lab 04/25/21  2246 04/25/21  1820 04/25/21  0356   hs Troponin-I 72.2* 59.2* 49.8*         Rads:   No results found.      Barbaraann Faster, MD  05/01/2021    *This note was generated by the Epic EMR system/ Dragon speech recognition and may contain inherent errors or omissions not intended by the user. Grammatical errors, random word insertions, deletions, pronoun errors and incomplete sentences are occasional consequences of this technology due to software limitations. Not all errors are caught or corrected. If there are questions or concerns about the content of this note or information contained within the body of this dictation they should be addressed directly with the author for clarification

## 2021-05-01 NOTE — Plan of Care (Addendum)
A/O x 4. MAE, able to turn with minimal assist. Gait not observed.  Encouraged to call for assistance as needed. Safety maintained. Bed alarm on.   SB-SR. BP stable. Palpable pulses. With generalized edema. Afebrile at this time.   C/o chest pain with movement. PRN Percocet given, adequate pain relief as verbalized.   Not in respiratory distress. Clear, diminished breath sounds. O2 at 2 lpm/nc. O2 Sat=99%. SOB on exertion.  Used CPAP during hours of sleep.   Educated on fluid restrictions and heart healthy diet.     For HD on Monday.    Problem: Moderate/High Fall Risk Score >5  Goal: Patient will remain free of falls  Outcome: Progressing  Flowsheets (Taken 04/30/2021 2200)  High (Greater than 13):   HIGH-Visual cue at entrance to patient's room   HIGH-Bed alarm on at all times while patient in bed   HIGH-Apply yellow "Fall Risk" arm band   HIGH-Initiate use of floor mats as appropriate     Problem: Fluid and Electrolyte Imbalance/ Endocrine  Goal: Fluid and electrolyte balance are achieved/maintained  Outcome: Progressing  Flowsheets (Taken 05/01/2021 0215)  Fluid and electrolyte balance are achieved/maintained:   Monitor intake and output every shift   Observe for seizure activity and initiate seizure precautions if indicated   Assess for confusion/personality changes   Monitor/assess lab values and report abnormal values   Monitor daily weight   Assess and reassess fluid and electrolyte status   Observe for cardiac arrhythmias   Monitor for muscle weakness   Provide adequate hydration  Goal: Adequate hydration  Outcome: Progressing  Flowsheets (Taken 05/01/2021 0215)  Adequate hydration:   Assess mucus membranes, skin color, turgor, perfusion and presence of edema   Monitor and assess vital signs and perfusion   Assess for peripheral, sacral, periorbital and abdominal edema     Problem: Diabetes: Glucose Imbalance  Goal: Blood glucose stable at established goal  Outcome: Progressing  Flowsheets (Taken 05/01/2021  0215)  Blood glucose stable at established goal:   Monitor lab values   Include patient/family in decisions related to nutrition/dietary selections   Coordinate medication administration with meals, as indicated   Ensure appropriate consults are obtained (Nutrition, Diabetes Education, and Case Management)   Monitor intake and output.  Notify LIP if urine output is < 30 mL/hour.   Ensure appropriate diet and assess tolerance   Assess for hypoglycemia /hyperglycemia   Follow fluid restrictions/IV/PO parameters   Monitor/assess vital signs   Ensure adequate hydration   Ensure patient/family has adequate teaching materials     Problem: Potential for Aspiration  Goal: Risk of aspiration will be minimized  Outcome: Progressing  Flowsheets (Taken 05/01/2021 0215)  Risk of aspiration will be minimized:   Dysphagia screen: Keep patient NPO if patient fails screening   Order modified texture diet as recommended by Speech Pathologist   Place patient up in chair to eat, if possible   Instruct patient to take small bites   Assess and monitor ability to swallow   Place swallow precaution signage above bed   Thicken liquids as recommended by Speech Pathologist   Head of bed up 90 degrees to eat if unable to be out of bed   Supervise patient during oral intake   Instruct patient to take small single sips of liquid     Problem: Renal Instability  Goal: Fluid and electrolyte balance are achieved/maintained  Outcome: Progressing  Flowsheets (Taken 05/01/2021 0215)  Fluid and electrolyte balance are achieved/maintained:  Monitor intake and output every shift   Assess for confusion/personality changes   Observe for cardiac arrhythmias   Monitor/assess lab values and report abnormal values   Assess and reassess fluid and electrolyte status   Monitor for muscle weakness   Follow fluid restrictions/IV/PO parameters   Provide adequate hydration   Observe for seizure activity and initiate seizure precautions if indicated   Monitor daily  weight     Problem: Patient Receiving Advanced Renal Therapies  Goal: Therapy access site remains intact  Outcome: Progressing     Problem: Pain  Goal: Pain at adequate level as identified by patient  Outcome: Progressing  Flowsheets (Taken 05/01/2021 0215)  Pain at adequate level as identified by patient:   Identify patient comfort function goal   Assess for risk of opioid induced respiratory depression, including snoring/sleep apnea. Alert healthcare team of risk factors identified.   Assess pain on admission, during daily assessment and/or before any "as needed" intervention(s)   Reassess pain within 30-60 minutes of any procedure/intervention, per Pain Assessment, Intervention, Reassessment (AIR) Cycle   Evaluate if patient comfort function goal is met   Evaluate patient's satisfaction with pain management progress   Consult/collaborate with Physical Therapy, Occupational Therapy, and/or Speech Therapy   Include patient/patient care companion in decisions related to pain management as needed     Problem: Compromised Tissue integrity  Goal: Damaged tissue is healing and protected  Outcome: Progressing

## 2021-05-02 ENCOUNTER — Telehealth: Payer: Self-pay

## 2021-05-02 LAB — GLUCOSE WHOLE BLOOD - POCT
Whole Blood Glucose POCT: 146 mg/dL — ABNORMAL HIGH (ref 70–100)
Whole Blood Glucose POCT: 152 mg/dL — ABNORMAL HIGH (ref 70–100)
Whole Blood Glucose POCT: 296 mg/dL — ABNORMAL HIGH (ref 70–100)
Whole Blood Glucose POCT: 293 mg/dL — ABNORMAL HIGH (ref 70–100)

## 2021-05-02 LAB — VANCOMYCIN, RANDOM: Vancomycin Random: 17.6 ug/mL

## 2021-05-02 MED ORDER — SENNOSIDES-DOCUSATE SODIUM 8.6-50 MG PO TABS
2.0000 | ORAL_TABLET | Freq: Every evening | ORAL | 0 refills | Status: DC
Start: 2021-05-02 — End: 2021-06-22

## 2021-05-02 MED ORDER — GUAIFENESIN 100 MG/5ML PO LIQD
200.0000 mg | ORAL | Status: DC | PRN
Start: 2021-05-02 — End: 2021-05-06
  Filled 2021-05-02 (×2): qty 10

## 2021-05-02 MED ORDER — SEVELAMER CARBONATE 800 MG PO TABS
800.0000 mg | ORAL_TABLET | Freq: Three times a day (TID) | ORAL | Status: DC
Start: 2021-05-02 — End: 2021-05-06
  Administered 2021-05-02 – 2021-05-06 (×12): 800 mg via ORAL
  Filled 2021-05-02 (×13): qty 1

## 2021-05-02 MED ORDER — SODIUM CHLORIDE 0.9 % IV BOLUS
250.0000 mL | INTRAVENOUS | Status: AC | PRN
Start: 2021-05-02 — End: 2021-05-02

## 2021-05-02 MED ORDER — VANCOMYCIN HCL IN NACL 750-0.9 MG/250ML-% IV SOLN
750.0000 mg | Freq: Once | INTRAVENOUS | Status: AC
Start: 2021-05-02 — End: 2021-05-02
  Administered 2021-05-02: 750 mg via INTRAVENOUS
  Filled 2021-05-02: qty 750

## 2021-05-02 MED ORDER — LIDOCAINE 5 % EX PTCH
1.0000 | MEDICATED_PATCH | CUTANEOUS | Status: DC
Start: 2021-05-02 — End: 2021-05-06
  Administered 2021-05-02 – 2021-05-06 (×5): 1 via TRANSDERMAL
  Filled 2021-05-02 (×5): qty 1

## 2021-05-02 MED ORDER — ALBUTEROL-IPRATROPIUM 2.5-0.5 (3) MG/3ML IN SOLN
3.0000 mL | Freq: Three times a day (TID) | RESPIRATORY_TRACT | 0 refills | Status: DC
Start: 2021-05-02 — End: 2021-06-01

## 2021-05-02 MED ORDER — POLYETHYLENE GLYCOL 3350 17 G PO PACK
17.0000 g | PACK | Freq: Every day | ORAL | 0 refills | Status: DC | PRN
Start: 2021-05-02 — End: 2021-06-22

## 2021-05-02 MED ORDER — OXYCODONE-ACETAMINOPHEN 5-325 MG PO TABS
1.0000 | ORAL_TABLET | ORAL | 0 refills | Status: AC | PRN
Start: 2021-05-02 — End: 2021-05-09

## 2021-05-02 MED ORDER — GABAPENTIN 100 MG PO CAPS
100.0000 mg | ORAL_CAPSULE | Freq: Three times a day (TID) | ORAL | 0 refills | Status: DC
Start: 2021-05-02 — End: 2021-06-22

## 2021-05-02 MED ORDER — ALBUMIN HUMAN 25 % IV SOLN
100.0000 mL | INTRAVENOUS | Status: AC | PRN
Start: 2021-05-02 — End: 2021-05-02

## 2021-05-02 MED ORDER — SODIUM CHLORIDE 0.9 % IV BOLUS
100.0000 mL | INTRAVENOUS | Status: AC | PRN
Start: 2021-05-02 — End: 2021-05-02

## 2021-05-02 MED ORDER — LIDOCAINE 5 % EX PTCH
1.0000 | MEDICATED_PATCH | CUTANEOUS | 0 refills | Status: DC
Start: 2021-05-02 — End: 2021-06-22

## 2021-05-02 MED ORDER — GUAIFENESIN 100 MG/5ML PO LIQD
200.0000 mg | ORAL | 0 refills | Status: DC | PRN
Start: 2021-05-02 — End: 2021-06-22

## 2021-05-02 NOTE — Progress Notes (Signed)
PROGRESS NOTE    Date Time: 05/02/21 6:04 PM  Patient Name: [name], [name]  Patient status: [place] [facility] Day: 0      Assessment:   Reproducible chest pain , left breast swelling status post chest compression x1.  Acute metabolic encephalopathy , s/p  RRT( chest compression x 1 ) , found with hypoxia, head CT negative.  Left breast swelling rib cage x-ray negative, ultrasound with inflammatory changes  Status post mechanical fall , slipped in bathroom injury to lower lip, chin.  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  Hyperkalemia.  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 Add Lidoderm   Out patient .Dr.Karzai   Vancomycin until 05/27/2021 (MRSA infection)  On Lantus, sliding scale coverage  On amlodipine Coreg, monitor blood pressure.  Off rocephin nail removed , Vanco with dialysis till 05/27/2021  Dialysis per nephrology  Continue statin.  Monitor H&H and platelet count.  Does Not Use CPAP   Fall precaution, OT PT  DVT prophylaxis  DCP  - SNF  Per Arrangements   Labs and data reviewed.  Discussed with patient and nurse  Discussed with Dr. Janith Lima.  Discussed with physical therapist      Subjective:   Patient seen and examined :  Chest pain with movement.  Left breast swelling improving.  Afebrile.  98% on 3 L no fever or chills.  Hemodynamically stable.    Review of Systems:   General ROS:   Afebrile.  No chest pain palpitation  Intermittent cough, reproducible chest  pain.  No nausea vomiting abdominal pain.  Generalized weakness  Medications:     Current Facility-Administered Medications   Medication Dose Route Frequency    amLODIPine  10 mg Oral Daily    atorvastatin  40 mg Oral Daily    carvedilol  25 mg Oral Q12H SCH    dextrose  50 g Intravenous Once    gabapentin  100 mg Oral Q8H [place]    heparin (porcine)  5,000 Units Subcutaneous Q12H [place]    insulin glargine  40 Units Subcutaneous QHS    insulin lispro  1-4 Units Subcutaneous QHS    insulin lispro  1-8 Units Subcutaneous TID AC    insulin lispro  8 Units Subcutaneous TID AC    lidocaine  1 patch Transdermal Q24H    petrolatum   Topical Q12H    senna-docusate  2 tablet Oral QHS    sevelamer  800 mg Oral TID MEALS    vancomycin  750 mg Intravenous Once    vancomycin   Intravenous See Admin Instructions       acetaminophen **OR** acetaminophen, albumin human, albuterol-ipratropium, dextrose **OR** dextrose **OR** dextrose **OR** glucagon (rDNA), guaiFENesin, hydrALAZINE, lactulose, magnesium sulfate, melatonin, naloxone, ondansetron **OR** ondansetron, oxyCODONE-acetaminophen, polyethylene glycol, sodium chloride, sodium chloride    Physical Exam:   BP 131/63   Pulse 62   Temp 97.4 F (36.3 C)  Resp 16   Ht 1.575 m (5\' 2" )   Wt 104.5 kg (230 lb 6.1 oz)   LMP  (LMP Unknown) Comment: post menopausal  SpO2 98%   BMI 42.14 kg/m     Intake and Output Summary (Last 24 hours) at Date Time    Intake/Output Summary (Last 24 hours) at 05/02/2021 1804  Last data filed at 05/02/2021 1104  Gross per 24 hour   Intake 236 ml   Output 150 ml   Net 86 ml       General: :  awake alert oriented x3.  Neck: Supple no JVD  ENT: PERRLA, EOMI  Lungs: Coarse breath sounds no wheezing  Heart: Regular rate and rhythm no gallop  Abdomen: Soft nontender bowel sounds positive  Breast, left breast swelling, edema improving.  Extremities: Warm moist  Neuro: Grossly intact  GU: No CVA tenderness  Hemodialysis dependent  Labs:     CBC w/Diff CMP    Recent Labs   Lab 04/30/21  0412 04/29/21  1022 04/28/21  1036   WBC 6.68 8.06 7.28   Hgb 9.7* 10.8* 11.0*   Hematocrit 32.9* 35.9 37.3   Platelets 90* 107* 112*   MCV 94.8 93.2 96.9*       PT/INR         Recent Labs   Lab 04/30/21  0412 04/29/21  1022 04/28/21  1036   Sodium 139 137 137   Potassium 4.7 4.5 4.4   Chloride 103 102 103   CO2 28 25 24    BUN 40.0* 49.0* 37.0*   Creatinine 4.3* 5.4* 4.7*   Glucose 264* 152* 234*   Calcium 8.3* 8.3* 8.0*      Glucose POCT   Recent Labs   Lab 04/30/21  0412 04/29/21  1022 04/28/21  1036 04/27/21  0542 04/25/21  2246   Glucose 264* 152* 234* 189* 381*        Recent Labs   Lab 04/25/21  2246 04/25/21  1820   hs Troponin-I 72.2* 59.2*       Rads:   No results found.      Barbaraann Faster, MD  05/02/2021    *This note was generated by the Epic EMR system/ Dragon speech recognition and may contain inherent errors or omissions not intended by the user. Grammatical errors, random word insertions, deletions, pronoun errors and incomplete sentences are occasional consequences of this technology due to software limitations. Not all errors are caught or corrected. If there are questions or concerns about the content of this note or information contained within the body of this dictation they should be addressed directly with the author for clarification

## 2021-05-02 NOTE — Telephone Encounter (Signed)
Received a message from Dr. Foye Clock in regards to setting up an appointment.     Called and unable to leave a message as the patient's voice mail is full.

## 2021-05-02 NOTE — Progress Notes (Signed)
PULMONARY PROGRESS NOTE                                                                                                              [phone]    Date Time: 05/02/21 1:40 PM  Patient Name: [name], [name] 66 y.o. female admitted with Altered mental status  Admit Date: 04/23/2021    Patient status: Observation  Hospital Day: 0           Assessment:     Subcentimeter pulmonary nodules  Obstructive sleep apnea  End-stage renal disease  Swelling of the left breast suspicious for malignancy  Hypertension  Hyperlipidemia  Plan:   Consulted breast surgeon outpatient biopsy recommended  Dialysis as per nephrology  Nocturnal CPAP  Glycemic control  Follow pulmonary nodule outpatient, repeat CT scan in 3 to 6 months  Off antibiotics  DVT prophylaxis  Follow-up with pulmonary for pulmonary nodules  Subjective:     Patient seen and examined no new complaints except for some musculoskeletal chest pain      Medications:     Current Facility-Administered Medications   Medication Dose Route Frequency    amLODIPine  10 mg Oral Daily    atorvastatin  40 mg Oral Daily    carvedilol  25 mg Oral Q12H SCH    dextrose  50 g Intravenous Once    gabapentin  100 mg Oral Q8H [place]    heparin (porcine)  5,000 Units Subcutaneous Q12H [place]    insulin glargine  40 Units Subcutaneous QHS    insulin lispro  1-4 Units Subcutaneous QHS    insulin lispro  1-8 Units Subcutaneous TID AC    insulin lispro  8 Units Subcutaneous TID AC    lidocaine  1 patch Transdermal Q24H    petrolatum   Topical Q12H    senna-docusate  2 tablet Oral QHS    sevelamer  800 mg Oral BID Meals    vancomycin   Intravenous See Admin Instructions       Review of Systems:        General ROS: negative for - chills, fever or night sweats  Ophthalmic ROS: negative for - double vision, excessive tearing, itchy eyes or photophobia  ENT ROS: negative for - headaches, nasal congestion, sore throat or vertigo  Respiratory ROS: Positive for musculoskeletal chest pain   cardiovascular  ROS: negative for - chest pain, edema, orthopnea, rapid heart rate  Gastrointestinal ROS: negative for - abdominal pain, blood in stools, constipation, diarrhea, heartburn or nausea/vomiting  Musculoskeletal ROS: negative for - muscle pain  Neurological ROS: negative for - confusion, dizziness, headaches, speech problems, visual changes or weakness  Dermatological ROS: negative for dry skin, pruritus and rash      Physical Exam:     Vitals:    05/02/21 1104   BP: 105/52   Pulse: 63   Resp: (!) 30   Temp: 97.9 F (36.6 C)   SpO2: 99%         Intake/Output Summary (Last 24 hours) at 05/02/2021 1340  Last data filed at 05/02/2021 1104  Gross per 24 hour   Intake 1206 ml   Output 150 ml   Net 1056 ml            General appearance - alert,  and in no distress  Mental status - alert, oriented to person, place, and time  Eyes - pupils equal and reactive, extraocular eye movements intact  Ears - no external ear lesions seen  Nose - no nasal discharge   Mouth - clear oral mucosa, moist   Neck - supple, no significant adenopathy  Chest -clear to auscultation.  Left breast swelling appears to be improving  Heart - normal rate, regular rhythm, normal S1, S2, no murmurs, rubs, clicks or gallops  Abdomen - soft, nontender, nondistended, no masses or organomegaly  Neurological - alert, oriented, normal speech, no focal findings or movement disorder noted  Musculoskeletal - no joint swelling or tenderness  Extremities -  no pedal edema, no clubbing or cyanosis  Skin - normal coloration, no rashes      Labs:     CBC w/Diff CMP   Recent Labs   Lab 04/30/21  0412 04/29/21  1022 04/28/21  1036   WBC 6.68 8.06 7.28   Hgb 9.7* 10.8* 11.0*   Hematocrit 32.9* 35.9 37.3   Platelets 90* 107* 112*   MCV 94.8 93.2 96.9*         PT/INR         Recent Labs   Lab 04/30/21  0412 04/29/21  1022 04/28/21  1036   Sodium 139 137 137   Potassium 4.7 4.5 4.4   Chloride 103 102 103   CO2 28 25 24    BUN 40.0* 49.0* 37.0*   Creatinine 4.3* 5.4* 4.7*    Glucose 264* 152* 234*   Calcium 8.3* 8.3* 8.0*        Glucose POCT   Recent Labs   Lab 04/30/21  0412 04/29/21  1022 04/28/21  1036 04/27/21  0542 04/25/21  2246   Glucose 264* 152* 234* 189* 381*          Recent Labs   Lab 04/25/21  2246 04/25/21  1820   hs Troponin-I 72.2* 59.2*           ABGs:    ABG CollectionSite   Date Value Ref Range Status   04/23/2021 Right Radl  Final     Allen's Test   Date Value Ref Range Status   04/23/2021 Yes  Final     pH, Arterial   Date Value Ref Range Status   04/23/2021 7.242 (L) 7.350 - 7.450 Final     pCO2, Arterial   Date Value Ref Range Status   04/23/2021 59.5 (H) 35.0 - 45.0 mmhg Final     pO2, Arterial   Date Value Ref Range Status   04/23/2021 <42.7 (L) 80.0 - 90.0 mmhg Final     Comment:     Critical pO2 results called to [id number].  Readback confirmed by MM at  04/23/2021  22:37       HCO3, Arterial   Date Value Ref Range Status   04/23/2021 24.7 23.0 - 29.0 mEq/L Final     Base Excess, Arterial   Date Value Ref Range Status   04/23/2021 -3.0 (L) -2.0 - 2.0 mEq/L Final     O2 Sat, Arterial   Date Value Ref Range Status   04/23/2021 45.3 (L) 95.0 - 100.0 % Final  Urinalysis        Invalid input(s): LEUKOCYTESUR      Rads:   No results found.      Quillian Quince MD  05/02/2021  1:40 PM

## 2021-05-02 NOTE — Progress Notes (Signed)
Pt is contact isolation, PPE done per protocol. Pt is alert and oriented. Pt denied pain and sob. No other complain given. LAVF  access is optimally working post dialysis.  No bleeding on access site post tx.  Hemostasis achieved.  Hemodialysis ended and pt. able to finish tx.  Able to removed Net Fluid of 3.5L.  Reports were given to Primary nurse.       05/02/21 1900   Treatment Summary   Time Off Machine 1835   Duration of Treatment (Hours) 3   Treatment Type 1:1   Dialyzer Clearance Moderately streaked   Fluid Volume Off (mL) 3900   Prime Volume (mL) 200   Rinseback Volume (mL) 200   Fluid Given: Normal Saline (mL) 0   Fluid Given: PRBC  0 mL   Fluid Given: Albumin (mL) 0   Fluid Given: Other (mL) 0   Total Fluid Given 400   Hemodialysis Net Fluid Removed 3500   Post Treatment Assessment   Post-Treatment Weight (Kg)   (-3.5L.)   Patient Response to Treatment fairly tolerated   Additional Dialyzer Used 0   Graft/Fistula Left   No placement date or time found.   Access Type: Arteriovenous Fistula  Orientation: Left  Graft/Fistula Location: Upper Arm   Fistula/ Graft Assessment Abnormalities WDL   Needle Size 16 Gauge   Cannulation Sites held Arterial (min) 10   Cannulation Sites held Venous (min) 10   Hemostasis Achieved Yes   Vitals   Temp 97.4 F (36.3 C)   Heart Rate 61   Resp Rate 18   BP 155/67   SpO2 97 %   O2 Device Nasal cannula   Assessment   Mental Status Alert;Oriented   Cardiac Regularity Regular   Cardiac Rhythm Normal Sinus Rhythm   Respiratory Pattern Regular   Bilateral Breath Sounds Diminished   RLE Edema +2   LLE Edema +2   General Skin Color Appropriate for ethnicity   Skin Condition/Temp Warm;Dry   Abdomen Inspection Soft;Rounded   GI Symptoms None   Mobility Bed   Pain Assessment   Charting Type Reassessment   Pain Scale Used Numeric Scale (0-10)   Numeric Pain Scale   Pain Score 0   POSS Score 1   Education   Person taught Patient   Knowledge basis Substantial   Topics taught Fluid  Management;Procedure;Access care   [place] Understanding   Bedside Nurse Communication   Name of bedside RN - post dialysis Schuyler Amor, RN

## 2021-05-02 NOTE — PT Progress Note (Signed)
Physical Therapy Treatment  [name]  Post Acute Care Therapy Recommendations   Discharge Recommendations:  SNF    If recommended discharge disposition is not available, patient will require the following assistance: Assist with OOB activity, Assist with I/ADLs, and HH therapy    DME needs IF patient is discharging home: No additional equipment/DME recommended at this time    Therapy discharge recommendations may change with patient status.  Please refer to most recent note for up-to-date recommendations.    Is an Occupational Therapy Evaluation Indicated at this time? This patient is already on OT caseload.      Unit: [place] [address] INTERMEDIATE CARE  Bed: T6546/T0354-S    ___________________________________________________    Time of treatment:  Time Calculation  PT Received On: 05/02/21  Start Time: 0802  Stop Time: 0830  Time Calculation (min): 28 min            Chart Review and Collaboration with Care Team: 3 minutes, not included in above time.    PT Visit Number: 2    ___________________________________________________    Precautions:   Precautions  Weight Bearing Status: RLE WBAT (R heel wound)  Precaution Instructions Given to Patient: Yes  Fall Risks: High, Impaired balance/gait, History of fall(s)  Other Precautions: Contact    Personal Protective Equipment (PPE)  gloves, procedure gown, and procedure mask    Updated X-Rays/Tests/Labs:  Lab Results   Component Value Date/Time    HGB 9.7 (L) 04/30/2021 04:12 AM    HCT 32.9 (L) 04/30/2021 04:12 AM    K 4.7 04/30/2021 04:12 AM    NA 139 04/30/2021 04:12 AM    INR 1.2 (H) 03/04/2021 01:23 AM    TROPI 72.2 (AA) 04/25/2021 10:46 PM    TROPI 59.2 (A) 04/25/2021 06:20 PM    TROPI 49.8 (A) 04/25/2021 03:56 AM    TROPI 58.6 (A) 04/24/2021 06:34 PM    TROPI 0.01 03/18/2006 06:10 PM       All imaging reviewed, please see chart for details.      Subjective:  "I will try."  "It feels like I need to go to the bathroom."    Patient Goal: agreed to work  with PT    Pain Assessment  Pain Assessment: Numeric Scale (0-10)  Pain Score: 10-severe pain  POSS Score: Awake and Alert  Pain Location: Rib cage  Pain Orientation: Anterior  Pain Descriptors: Constant  Pain Frequency: Constant/continuous  Pain Intervention(s): Relaxation technique           Patient's medical condition is appropriate for Physical Therapy intervention at this time.  Patient is agreeable to participation in the therapy session. Nursing clears patient for therapy.      Objective:  Observation of Patient/Vital Signs:  VSS throughout session. On 3L throughout session      Cognition/Neuro Status  Arousal/Alertness: Appropriate responses to stimuli  Attention Span: Appears intact  Orientation Level: Oriented X4  Memory: Appears intact  Following Commands: Follows all commands and directions without difficulty  Safety Awareness: minimal verbal instruction  Insights: Fully aware of deficits;Educated in safety awareness  Problem Solving: supervision  Behavior: calm;cooperative  Motor Planning: intact  Coordination: intact      Functional Mobility  Supine to Sit: Minimal Assist;Increased Time;Increased Effort;HOB raised;using bedrail  Scooting to HOB: Dependent (x2 person in supine)  Scooting to EOB: Stand by Assist  Sit to Stand: Minimal Assist;Increased Time;Increased Effort;with instruction for hand placement to increase safety  Transfers  Bed to Chair:  Contact Guard Assist (to bedside commode)  Chair to Bed: Youth worker Used for Functional Transfer: front-wheeled walker  Locomotion  Ambulation: Minimal Assist;with front-wheeled walker (progressed to CGA when returning to bed)  Pattern: decreased cadence;decreased step length;R foot decreased clearance;L foot decreased clearance;Step to;Wide BOS  Distance Walked (ft) (Step 6,7): 4 Feet    Therapeutic Exercise. Tactile facilitation and verbal instruction provided for proper technique.   Heelslides: x5 L/R  Hip Flexion: x5 L/R  Knee AROM :  x5 L/R  Ankle Pumps: x10 L/R     Neuro Re-Ed  Sitting Balance: without support;without instruction;independent  Standing Balance: without instruction;with support;contact guard assist (with FWW while peri-care performed)           Educated the Patient to role of physical therapy, plan of care, goals of therapy and HEP, safety with mobility and ADLs, energy conservation techniques, pursed lip breathing with verbalized understanding .    Patient left in sitting EOB with OT present. RN notified of session outcome. CM team and attending have been previously notified of d/c recommendation; no updates to d/c recommendation since that time.      Assessment:  Pt tolerated increased OOB activity compared to initial evaluation. Continues to require increased assist for bed mobility and standing transfers with FWW. Amb remains limited 2/2 fatigue. Often required verbal instruction to decrease breathing rate when anxious 2/2 rib cage pain. Will continue to benefit from skilled PT interventions to maximize independence and safety with all functional mobility. SNF remains  rec at this time as pt is not currently at her baseline.                 PMP Activity: Step 5 - Chair  Distance Walked (ft) (Step 6,7): 4 Feet    Plan:  Treatment/Interventions: Bed mobility, Equipment eval/education, Patient/family training, Endurance training, LE strengthening/ROM, Functional transfer training, Neuromuscular re-education, Gait training, Exercise      PT Frequency: 2-3x/wk   Continue plan of care.    Goals:  Goals  Goal Formulation: With patient  Time for Goal Acheivement: By time of discharge  Goals: Select goal  Pt Will Go Supine To Sit: with stand by assist, to maximize functional mobility and independence  Pt Will Perform Sit To Supine: with stand by assist, to maximize functional mobility and independence  Pt Will Perform Sit to Stand: modified independent, to maximize functional mobility and independence, Partly met (met goal of CGA  from bedside commode; Min A/CGA from EOB)  Pt Will Transfer Bed/Chair: with rolling walker, modified independent, to maximize functional mobility and independence, Partly met (met goal of CGA to [facility])  Pt Will Ambulate: 51-100 feet, with rolling walker, with stand by assist, to maximize functional mobility and independence, Partly met  Pt Will Perform Home Exer Program: independent, to maximize functional mobility and independence, Partly met      Fleeta Emmer, [place], [place]  [phone]  05/02/2021 8:59 AM  7:00-3:30pm, M-F        [facility]  Patient: [name] MRN#: [id number]  Unit: [place] [facility] INTERMEDIATE CARE Bed: S1683/F2902-X

## 2021-05-02 NOTE — Plan of Care (Signed)
Problem: Renal Instability  Goal: Fluid and electrolyte balance are achieved/maintained  Outcome: Progressing  Flowsheets (Taken 05/02/2021 1606)  Fluid and electrolyte balance are achieved/maintained:   Monitor/assess lab values and report abnormal values   Monitor daily weight   Assess for confusion/personality changes   Observe for cardiac arrhythmias  Goal: Free from infection  Outcome: Progressing  Flowsheets (Taken 05/02/2021 1606)  Free from infection: Monitor/assess for signs and symptoms of infection     Problem: Patient Receiving Advanced Renal Therapies  Goal: Therapy access site remains intact  Outcome: Progressing  Flowsheets (Taken 05/02/2021 1606)  Therapy access site remains intact: Assess therapy access site

## 2021-05-02 NOTE — Discharge Instr - AVS First Page (Addendum)
Date of Admission: 04/23/2021    Date of Discharge: 05/02/2021    Discharge Physician: Barbaraann Faster, MD, MD    Dear [name],     Thank you for choosing [facility] for your emergency care needs. We strive to provide EXCELLENT care to you and your family.     In an effort to explain clearly why you were here in the hospital, I've written a very brief summary. I hope that you find it useful. Other details including formal diagnosis, medication changes, follow up appointment recommendations, and access to MyChart for formal medical records can be found in this packet.       You were admitted for:   Mechanical fall, left breast injury, swelling, inflammation.  Acute metabolic encephalopathy.  Posttraumatic reproducible chest pain.  End-stage renal disease on dialysis  Right great toenail avulsion.  Peripheral vascular disease  MRSA infection of abdominal wall at PD catheter site needs antibiotic until May 27, 2021  Lung nodule.  Left breast inflammation.  Follow-up with lung doctor for pulmonary nodule, breast doctor for breast , Interventional radiology.  Continue dialysis per schedule    If you were prescribed medications, they were either sent to your pharmacy or provided to you as paper prescriptions.      Please Make sure to follow up with your primary care doctor within 1 week.     Please don't hesitate to call me with any questions. It was a pleasure taking care of you, please do not hesitate to call me on number listed below for any questions.      Finally, as your discharging physician, you may be receiving a survey which is regarding my care. I would greatly value and appreciate your feedback as I strive for excellence.     Respectfully yours,    Barbaraann Faster, MD, MD        [facility]   [place]   [id number]   [place] [address]   [phone]

## 2021-05-02 NOTE — Progress Notes (Addendum)
[facility] PROGRESS NOTE  [id number]    Date Time: 05/02/21 1:58 PM  Patient Name: [name]  Attending Physician: Barbaraann Faster, MD    CC: follow-up ESRD    Assessment:     ESRD on HD MWF at [facility] - followed by Dr. Randel Pigg  - noncompliant with HD  Hyperkalemia - resolved  Mild hypocalcemia due to CKD  Anemia in CKD - lower today  HTN - controlled  DM type 2 - uncontrolled  Hyperphosphatemia: better  Secondary hyperparathyroidism: PTH 1579 on 3/23  Left breast swelling - seen by onc, ddx includes malignancy  Abdominal wall abscess MRSA on vancomycin until 5/5    Recommendations:   1. HD today, will draw labs on dialysis  2. May go to SNF [facility] rehab)  3. HD again tomorrow      Case discussed with: pt    Corky Mull, MD  [facility]  [id number] (office)    Subjective:   On 3L NC  C/o chest discomfort    Review of Systems:     No cp  No sob      Physical Exam:     Vitals:    05/02/21 0428 05/02/21 0450 05/02/21 0807 05/02/21 1104   BP:  160/72 140/63 105/52   Pulse:  61 63 63   Resp:  20 (!) 48 (!) 30   Temp:  98.3 F (36.8 C) 98.3 F (36.8 C) 97.9 F (36.6 C)   TempSrc:  Oral Axillary Axillary   SpO2: 99% 99% 99% 99%   Weight:  104.5 kg (230 lb 6.1 oz)     Height:         Intake and Output Summary (Last 24 hours) at Date Time    Intake/Output Summary (Last 24 hours) at 05/02/2021 1358  Last data filed at 05/02/2021 1104  Gross per 24 hour   Intake 1206 ml   Output 150 ml   Net 1056 ml       General: awake, alert, oriented x 3, no acute distress  Cardiovascular: regular rate and rhythm, no murmurs, rubs or gallops  Lungs: clear to auscultation bilaterally, without wheezing, rhonchi, or rales  Abdomen: soft, non-tender, non-distended, normoactive bowel sounds  Extremities: +LE edema    Meds:      Scheduled Meds: PRN Meds:    amLODIPine, 10 mg, Oral, Daily  atorvastatin, 40 mg, Oral, Daily  carvedilol, 25 mg, Oral, Q12H SCH  dextrose, 50 g, Intravenous,  Once  gabapentin, 100 mg, Oral, Q8H SCH  heparin (porcine), 5,000 Units, Subcutaneous, Q12H SCH  insulin glargine, 40 Units, Subcutaneous, QHS  insulin lispro, 1-4 Units, Subcutaneous, QHS  insulin lispro, 1-8 Units, Subcutaneous, TID AC  insulin lispro, 8 Units, Subcutaneous, TID AC  lidocaine, 1 patch, Transdermal, Q24H  petrolatum, , Topical, Q12H  senna-docusate, 2 tablet, Oral, QHS  sevelamer, 800 mg, Oral, BID Meals  vancomycin, , Intravenous, See Admin Instructions          Continuous Infusions:   acetaminophen, 650 mg, Q6H PRN   Or  acetaminophen, 650 mg, Q6H PRN  albumin human, 100 mL, PRN  albuterol-ipratropium, 3 mL, Q6H PRN  dextrose, 15 g of glucose, PRN   Or  dextrose, 12.5 g, PRN   Or  dextrose, 12.5 g, PRN   Or  glucagon (rDNA), 1 mg, PRN  guaiFENesin, 200 mg, Q4H PRN  hydrALAZINE, 10 mg, Q6H PRN  lactulose, 10 g, Daily PRN  magnesium sulfate, 1 g, PRN  melatonin, 3 mg, QHS PRN  naloxone, 0.2 mg, PRN  ondansetron, 4 mg, Q6H PRN   Or  ondansetron, 4 mg, Q6H PRN  oxyCODONE-acetaminophen, 1 tablet, Q4H PRN  polyethylene glycol, 17 g, Daily PRN  sodium chloride, 100 mL, Q1H PRN  sodium chloride, 250 mL, PRN          Labs:     Recent Labs   Lab 04/30/21  0412 04/29/21  1022 04/28/21  1036   WBC 6.68 8.06 7.28   Hgb 9.7* 10.8* 11.0*   Hematocrit 32.9* 35.9 37.3   Platelets 90* 107* 112*       Recent Labs   Lab 04/30/21  0412 04/29/21  1022 04/28/21  1036   Sodium 139 137 137   Potassium 4.7 4.5 4.4   Chloride 103 102 103   CO2 28 25 24    BUN 40.0* 49.0* 37.0*   Creatinine 4.3* 5.4* 4.7*   Calcium 8.3* 8.3* 8.0*   Glucose 264* 152* 234*   EGFR 12.5 9.6 11.2             Invalid input(s): LEUKOCYTESUR      Imaging personally reviewed, including: No results found.    Signed by: Corky Mull, MD

## 2021-05-02 NOTE — Nursing Progress Note (Signed)
PT is stable on shift, on RA, no sign of distress/discomfort noted noted, med's well tolerated, had dialysis as ordered, 3.5 L taken in 3 hrs well tolerated, site intact dressing done, stable in bed, report passed on to incoming nurse.

## 2021-05-02 NOTE — OT Progress Note (Signed)
Occupational Therapy Note    Occupational Therapy Treatment  [name]        Post Acute Care Therapy Recommendations   Discharge Recommendations:  SNF    If recommended discharge disposition is not available, patient will require the following assistance: Supervision, Assist with OOB activity, Assist with I/ADLs, and HH therapy    DME needs IF patient is discharging home: [facility]    Therapy discharge recommendations may change with patient status.  Please refer to most recent note for up-to-date recommendations.    Is PT evaluation indicated at this time? This patient is already on PT caseload.    Unit: [place] [facility] INTERMEDIATE CARE  Bed: Z2248/G5003-B      ___________________________________________________    Time of treatment:  Time Calculation  OT Received On: 05/02/21  Start Time: 0830  Stop Time: 0488  Time Calculation (min): 27 min             Chart Review and Collaboration with Care Team: 4 minutes, not included in above time.    OT Visit Number: 2      Precautions and Contraindications:    Precautions  Weight Bearing Status: RLE WBAT  Fall Risks: High;History of fall(s)  Other Precautions: Contact    Personal Protective Equipment (PPE)  gloves, procedure gown, and procedure mask    Updated Labs:  Lab Results   Component Value Date/Time    HGB 9.7 (L) 04/30/2021 04:12 AM    HCT 32.9 (L) 04/30/2021 04:12 AM    K 4.7 04/30/2021 04:12 AM    NA 139 04/30/2021 04:12 AM    INR 1.2 (H) 03/04/2021 01:23 AM    TROPI 72.2 (AA) 04/25/2021 10:46 PM    TROPI 59.2 (A) 04/25/2021 06:20 PM    TROPI 49.8 (A) 04/25/2021 03:56 AM    TROPI 58.6 (A) 04/24/2021 06:34 PM    TROPI 0.01 03/18/2006 06:10 PM       All imaging reviewed, please see chart for details.    Subjective:    "I can't breath, it hurts" Pt stating about rib cage pain       Patient's medical condition is appropriate for Occupational Therapy intervention at this time.  Patient is agreeable to participation in the therapy session. Nursing clears  patient for therapy.    Pain Assessment  Pain Assessment: Wong-Baker FACES  Wong-Baker FACES Pain Rating: Hurts whole lot  Pain Location: Rib cage  Pain Intervention(s): Repositioned;Rest;Therapeutic presence      Objective:  Observation of Patient/Vital Signs:  stable    Cognition/Neuro Status  Arousal/Alertness: Appropriate responses to stimuli  Attention Span: Appears intact  Orientation Level: Oriented X4  Memory: Appears intact  Following Commands: Follows all commands and directions without difficulty  Safety Awareness: minimal verbal instruction  Insights: Fully aware of deficits;Educated in safety awareness  Problem Solving: supervision  Behavior: calm;cooperative  Motor Planning: intact  Coordination: intact    Functional Mobility  Scooting Transfers: Maximal Assist (x2 towards HOB)  Sit to Supine Transfers: Moderate Assist (BLE support)  Functional Mobility/Ambulation:  (To note, Pt did amublate short distance to [facility] prior with PT)    Self-care and Home Management  Grooming: Supervision;edge of bed;teeth care  Toileting: Dependent;standing;perineal hygiene  Functional Transfers: other (Comment) (PTw/pt min A for ambulation to [facility])    Therapeutic Exercises  Shoulder AROM: Flexion;Extension    Educated the Patient to role of occupational therapy, plan of care, goals of therapy and safety with mobility and ADLs with verbalized  understanding .     Patient left in bed with alarm and all other medical equipment in place and call bell and all personal items/needs within reach.  RN notified of session outcome.  CM team and attending have been previously notified of d/c recommendation; no updates to d/c recommendation since that time.      Assessment:  Pt with slow progression towards OT goals 2/2 fatigue from PT session, as well as, increased pain. Pt completing various tasks sitting EOB for rest break, however having increased pain, returned to supine.   RN receiving pain meds for pt. Continue to rec inpatient OT  and SNFD upon d/c to address deficits in endurance & strength impacting participation in ADLs.    PMP Activity: Step 4 - Dangle at Bedside  Distance Walked (ft) (Step 6,7): 0 Feet      Plan:  OT Frequency Recommended: 2-3x/wk  Goal Formulation: Patient      Time For Goal Achievement: by time of discharge  ADL Goals  Patient will groom self: Supervision, Goal met  Patient will dress upper body: Supervision, Not met  Patient will dress lower body: Minimal Assist, Not met  Patient will toilet: Minimal Assist, Not met  Mobility and Transfer Goals  Pt will transfer bed to toilet: Minimal Assist, Not met                       Continue plan of care.      [name], [place]  [phone]  Physical Medicine and [facility]    05/02/2021  9:19 AM    [facility]  Patient: [name] MRN#: [id number]   Unit: [place] [facility] INTERMEDIATE CARE Bed: Z0092/Z3007-M

## 2021-05-02 NOTE — Progress Notes (Addendum)
CM spoke with Sandria Senter with [facility] regarding pt and spouse's decision. Requested clinicals faxed to [phone]. CM left VM for spouse. CM will continue to follow.      12:40- Kristen received and forward updated clinicals to [facility] department. CM will continue to follow.      DELAYED . Josem Kaufmann is still pending. CM will follow up with Kristen on 4/11.      [facility]  [phone]

## 2021-05-02 NOTE — Progress Notes (Signed)
PROGRESS NOTE     Date Time: 05/02/21 1:59 PM  Patient Name: [name],[name]      ASSESSMENT:   Peau'D orange type skin changes of the left breast without overt masses, erythema-  likely dependent edema -concern for malignancy on USG- outpatient biopsy recommended- improving post dialysis  Acute on Chronic Thrombocytopenia, likely ITP- post infectious exacerbation- stable  Chronic Normocytic Anemia, due to CKD - stable  Subcentimeter pulmonary nodule- pulmonology following  Acute encephalopathy, status post RRT( chest compression x 1 ) , found with hypoxia, head CT negative.- at baseline  Status post mechanical fall , slipped in bathroom injury to lower lip, chin.  Injury to left chest wall, edema, X Ray Neg for Fx, right great toe from fall  Recent abdominal wall abscess MRSA at PD catheter site on Vanco at dialysis.  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  Continue pain management  DVT ppx  Continue home meds for comorbid conditions    Subjective:     C/o Chest pain with movement  Left breast swelling improving   On 3L NC  Afebrile       Medications:      Scheduled Meds: PRN Meds:    amLODIPine, 10 mg, Oral, Daily  atorvastatin, 40 mg, Oral, Daily  carvedilol, 25 mg, Oral, Q12H SCH  dextrose, 50 g, Intravenous,  Once  gabapentin, 100 mg, Oral, Q8H SCH  heparin (porcine), 5,000 Units, Subcutaneous, Q12H SCH  insulin glargine, 40 Units, Subcutaneous, QHS  insulin lispro, 1-4 Units, Subcutaneous, QHS  insulin lispro, 1-8 Units, Subcutaneous, TID AC  insulin lispro, 8 Units, Subcutaneous, TID AC  lidocaine, 1 patch, Transdermal, Q24H  petrolatum, , Topical, Q12H  senna-docusate, 2 tablet, Oral, QHS  sevelamer, 800 mg, Oral, BID Meals  vancomycin, , Intravenous, See Admin Instructions         acetaminophen, 650 mg, Q6H PRN   Or  acetaminophen, 650 mg, Q6H PRN  albumin human, 100 mL, PRN  albuterol-ipratropium, 3 mL, Q6H PRN  dextrose, 15 g of glucose, PRN   Or  dextrose, 12.5 g, PRN   Or  dextrose, 12.5 g, PRN   Or  glucagon (rDNA), 1 mg, PRN  guaiFENesin, 200 mg, Q4H PRN  hydrALAZINE, 10 mg, Q6H PRN  lactulose, 10 g, Daily PRN  magnesium sulfate, 1 g, PRN  melatonin, 3 mg, QHS PRN  naloxone, 0.2 mg, PRN  ondansetron, 4 mg, Q6H PRN   Or  ondansetron, 4 mg, Q6H PRN  oxyCODONE-acetaminophen, 1 tablet, Q4H PRN  polyethylene glycol, 17 g, Daily PRN  sodium chloride, 100 mL, Q1H PRN  sodium chloride, 250 mL, PRN          I personally reviewed all of the medications    Review  of Systems:   Per narrative, 2 system review was done.    Physical Exam:   Visit Vitals  BP 105/52   Pulse 63   Temp 97.9 F (36.6 C) (Axillary)   Resp (!) 30   Ht 1.575 m (5\' 2" )   Wt 104.5 kg (230 lb 6.1 oz)   LMP  (LMP Unknown)   SpO2 99%   BMI 42.14 kg/m         Intake/Output Summary (Last 24 hours) at 05/02/2021 1359  Last data filed at 05/02/2021 1104  Gross per 24 hour   Intake 1206 ml   Output 150 ml   Net 1056 ml            Physical Exam:      General: no acute distress.  Skin: no rashes or lesions.  HENT: no scleral icterus, no lymphadenopathy.  Lungs: Bilateral breath sounds anterior chest,   Bilateral breast examination was done and left breast appears tender to touch with some skin edema and pitting without any overt erythema  or palpable lumps.   Right breast exam was normal.  Heart: normal rate, regular rhythm, no murmur.  Abdomen: soft, non-tender, no masses.  Musculoskeletal: Right great toenail removed.   Neurologic: alert, and oriented X3, CN normal speech, no focal findings or movement disorder noted.            Labs:     Recent Labs   Lab 04/30/21  0412 04/29/21  1022 04/28/21  1036   Glucose 264* 152* 234*   BUN 40.0* 49.0* 37.0*   Creatinine 4.3* 5.4* 4.7*   Calcium 8.3* 8.3* 8.0*   Sodium 139 137 137   Potassium 4.7 4.5 4.4   Chloride 103 102 103   CO2 28 25 24        Recent Labs   Lab 04/30/21  0412 04/29/21  1022 04/28/21  1036   WBC 6.68 8.06 7.28   Hgb 9.7* 10.8* 11.0*   Hematocrit 32.9* 35.9 37.3   MCV 94.8 93.2 96.9*   MCH 28.0 28.1 28.6   MCHC 29.5* 30.1* 29.5*   Platelets 90* 107* 112*         No results for input(s): PTT, PT, INR in the last 72 hours.      Microbiology:  Microbiology Results (last 15 days)       Procedure Component Value Units Date/Time    COVID-19 (SARS-CoV-2) only ([facility] Rapid) asymptomatic admission [[id number]] Collected: 04/23/21 1352    Order Status: Completed Specimen: Nasopharyngeal Updated: 04/23/21 1429     Purpose of COVID testing Screening     SARS-CoV-2 Specimen Source Nasal Swab     SARS CoV 2 Overall Result Not Detected     Comment: __________________________________________________  -A result of "Detected" indicates POSITIVE for the    presence of SARS CoV-2 RNA  -A result of "Not Detected" indicates NEGATIVE for the    presence of SARS CoV-2 RNA  __________________________________________________________  Test performed using the Roche cobas Liat SARS-CoV-2 assay. This assay is  only for use under the Food and Drug Administration's Emergency Use  Authorization. This is a real-time RT-PCR assay for the qualitative  detection of SARS-CoV-2 RNA. Viral nucleic acids may persist in vivo,  independent of viability. Detection of viral nucleic acid does not imply the  presence of infectious virus, or that virus  nucleic acid is the cause of  clinical symptoms. Negative results do not preclude SARS-CoV-2 infection and  should not be used  as the sole basis for diagnosis, treatment or other  patient management decisions. Negative results must be combined with  clinical observations, patient history, and/or epidemiological information.  Invalid results may be due to inhibiting substances in the specimen and  recollection should occur. Please see Fact Sheets for patients and providers  located:  https://www.[employer].com/         Narrative:      o Collect and clearly label specimen type:  o PREFERRED-Upper respiratory specimen: One Nasal Swab in  Transport Media.  o Hand deliver to laboratory ASAP  Indication for testing->Extended care facility admission to  semi private room  Screening                Signed by: Cindi Carbon, MD

## 2021-05-02 NOTE — Progress Notes (Signed)
Progress Note- Pharmacy Vancomycin Dosing Consult:  Day 9, Vanco with dialysis till 05/27/2021  Vancomycin Indication: MRSA Abdominal Wall Infection  Vancomycin Monitoring Goal: Pre-HD 15-25 mcg/mL, dose by level    Pertinent Cultures:   Wcx heavy MRSA      Serum creatinine: 4.3 mg/dL (H) 04/30/21 0412  Estimated creatinine clearance: 14.6 mL/min (A)    Assessment   Measured Vancomycin Level:   Recent Labs   Lab 05/02/21  1531 04/29/21  1022 04/28/21  1036 04/27/21  0542   Vancomycin Random 17.6 25.2 21.9 18.1         Recommendations:  Will give 750 mg x1 post dialysis   Next pre-HD Random Vancomycin level on 04/11 w/ AM labs, please adjust time according to HD.     Thank you,  Ardine Bjork, [place]

## 2021-05-02 NOTE — Progress Notes (Signed)
Arrived in room, pt is alert and oriented. Pt denied pain and sob. HR on low side noted.  Pt on nasal cannula. LAVF access is optimally working. Report received from primary RN pre-dialysis. Timeouts and safety checks done. Will closely monitor the pt.        05/02/21 1525   Bedside Nurse Communication   Name of bedside RN - pre dialysis Schuyler Amor, RN   Treatment Initiation- With Dialysis Precautions   Time Out/Safety Check Completed Yes   Consent for HD signed for this hospitalization (Date) 02/04/21   Consent for HD signed for this hospitalization (Time) 1450   Blood Consent Verified N/A   Dialysis Precautions All Connections Secured;Saline Line Double Clamped;Venous Parameters Set;Arterial Parameters Set;Air Foam Detecctor Engaged   Dialysis Treatment Type Routine   Special Considerations contact isolation   Is patient diabetic? Yes   RO/Hemodialysis Architectural technologist   Is Total Chlorine less than 0.1 ppm? Yes   Orignial Total Chlorine Testing Time 1520   At 4 Hour Total Chlorine Testing Time 1525   RO/Hemodialysis Facilities manager Number 4    Machine Serial Number [id number]   RO # 21   RO Serial # [id number]   pH 7.2   Pressure Test Verified Yes   Alarms Verified Passed   Machine Temperature 98.6 F (37 C)   Alarms Verified Yes   Na+ mEq (Machine) 138 mEq   Bicarb mEq (Machine) 35 mEq   Hemodialysis Conductivity (Machine) 13.8   Hemodialysis Conductivity (Meter) 13.8   Dialyzer Lot Number [id number]   Tubing Lot Number [id number]   RO Machine Log Completed Yes   Hepatitis Status   HBsAg (Antigen) Result Negative   HBsAg Date Drawn 04/13/21   HBsAg Repeat Draw Due Date 05/11/21   Dialysis Weight   Pre-Treatment Weight (Kg) 106.2   Scale Type ICU Bed Scale   Vitals   Temp 97.4 F (36.3 C)   Heart Rate (!) 59   Resp Rate 17   BP 122/58   SpO2 96 %   O2 Device Nasal cannula   Assessment   Mental Status Alert;Oriented   Cardiac Regularity Regular   Cardiac Rhythm Sinus Brady   Respiratory  WDL    Respiratory Pattern Regular   Bilateral Breath Sounds Diminished   RLE Edema +2   LLE Edema +2   General Skin Color Appropriate for ethnicity   Skin Condition/Temp Warm;Dry   Abdomen Inspection Soft;Rounded   GI Symptoms None   Mobility Bed   Graft/Fistula Left   No placement date or time found.   Access Type: Arteriovenous Fistula  Orientation: Left  Graft/Fistula Location: Upper Arm   Fistula/ Graft Assessment Abnormalities WDL   Pain Assessment   Charting Type Assessment   Pain Scale Used Numeric Scale (0-10)   Numeric Pain Scale   Pain Score 0   POSS Score 1   Hemodialysis Comments   Pre-Hemodialysis Comments Timeout and safety checks done        05/02/21 1525   Bedside Nurse Communication   Name of bedside RN - pre dialysis Schuyler Amor, RN   Treatment Initiation- With Dialysis Precautions   Time Out/Safety Check Completed Yes   Consent for HD signed for this hospitalization (Date) 02/04/21   Consent for HD signed for this hospitalization (Time) 1450   Blood Consent Verified N/A   Dialysis Precautions All Connections Secured;Saline Line Double Clamped;Venous Parameters Set;Arterial Parameters Set;Veterinary surgeon   Dialysis  Treatment Type Routine   Special Considerations contact isolation   Is patient diabetic? Yes   RO/Hemodialysis Architectural technologist   Is Total Chlorine less than 0.1 ppm? Yes   Orignial Total Chlorine Testing Time 1520   At 4 Hour Total Chlorine Testing Time 1525   RO/Hemodialysis Facilities manager Number 4    Machine Serial Number [id number]   RO # 21   RO Serial # [id number]   pH 7.2   Pressure Test Verified Yes   Alarms Verified Passed   Machine Temperature 98.6 F (37 C)   Alarms Verified Yes   Na+ mEq (Machine) 138 mEq   Bicarb mEq (Machine) 35 mEq   Hemodialysis Conductivity (Machine) 13.8   Hemodialysis Conductivity (Meter) 13.8   Dialyzer Lot Number [id number]   Tubing Lot Number [id number]   RO Machine Log Completed Yes   Hepatitis Status   HBsAg (Antigen) Result  Negative   HBsAg Date Drawn 04/13/21   HBsAg Repeat Draw Due Date 05/11/21   Dialysis Weight   Pre-Treatment Weight (Kg) 106.2   Scale Type ICU Bed Scale   Vitals   Temp 97.4 F (36.3 C)   Heart Rate (!) 59   Resp Rate 17   BP 122/58   SpO2 96 %   O2 Device Nasal cannula   Assessment   Mental Status Alert;Oriented   Cardiac Regularity Regular   Cardiac Rhythm Sinus Brady   Respiratory  WDL   Respiratory Pattern Regular   Bilateral Breath Sounds Diminished   RLE Edema +2   LLE Edema +2   General Skin Color Appropriate for ethnicity   Skin Condition/Temp Warm;Dry   Abdomen Inspection Soft;Rounded   GI Symptoms None   Mobility Bed   Graft/Fistula Left   No placement date or time found.   Access Type: Arteriovenous Fistula  Orientation: Left  Graft/Fistula Location: Upper Arm   Fistula/ Graft Assessment Abnormalities WDL   Pain Assessment   Charting Type Assessment   Pain Scale Used Numeric Scale (0-10)   Numeric Pain Scale   Pain Score 0   POSS Score 1   Hemodialysis Comments   Pre-Hemodialysis Comments Timeout and safety checks done

## 2021-05-02 NOTE — Discharge Summary (Signed)
DISCHARGE SUMMARY    Date Time: 05/03/21 10:10 AM  Patient Name: [name]  Attending Physician: Barbaraann Faster, MD    Date of Admission:   04/23/2021    Date of Discharge:   05/03/2021    Reason for Admission:   Altered mental status [R41.82]    Discharge Dx:   Present on Admission:   Altered mental status  Reproducible chest pain , left breast swelling status post chest compression x1.  Acute metabolic encephalopathy , s/p  RRT( chest compression x 1 ) , found with hypoxia, head CT negative.  Left breast swelling rib cage x-ray negative, ultrasound with inflammatory changes  Status post mechanical fall , slipped in bathroom injury to lower lip, chin.  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  Hyperkalemia.  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  Nail removed by podiatry  PAD PVR infrainguinal disease bilateral ,Seen by IR  No Intervention indicated if right helix wound does not heal we will consider angiogram by interventional radiology.  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 Make sure you  understand how and when to take each.  Commonly known as: TYLENOL  Take 2 tablets (650 mg) by mouth every 6 (six) hours as needed for Fever     * acetaminophen 500 MG tablet  Dose: 500 mg  What changed: Another medication with the same name was added. Make sure you understand how and when to take each.  Commonly known as: TYLENOL  Take 500 mg by mouth every 6 (six) hours as needed for Pain     albuterol sulfate HFA 108 (90 Base) MCG/ACT inhaler  Dose: 2 puff  Commonly known as: PROVENTIL  Inhale 2 puffs into the lungs every 6 (six) hours as needed for Wheezing or Shortness of Breath     albuterol-ipratropium 2.5-0.5(3) mg/3 mL nebulizer  Dose: 3 mL  Commonly known as: DUO-NEB  Take 3 mLs by nebulization every 8 (eight) hours     amLODIPine 10 MG tablet  Dose: 1 tablet  Commonly known as: NORVASC  Take 1 tablet by mouth daily     aspirin 81 MG chewable tablet  Dose: 81 mg  Chew 81 mg by mouth daily     atorvastatin 40 MG tablet  Dose: 40 mg  Commonly known as: LIPITOR  Take 1 tablet (40 mg) by mouth daily     carvedilol 25 MG tablet  Dose: 25 mg  Commonly known as: COREG  Take  1 tablet (25 mg) by mouth every 12 (twelve) hours     dextromethorphan-guaiFENesin 30-600 MG per 12 hr tablet  Dose: 1 tablet  Commonly known as: MUCINEX DM  Take 1 tablet by mouth every 12 (twelve) hours     gabapentin 100 MG capsule  Dose: 100 mg  Commonly known as: NEURONTIN  Take 1 capsule (100 mg) by mouth every 8 (eight) hours     guaiFENesin 100 MG/5ML oral liquid  Dose: 200 mg  Commonly known as: ROBITUSSIN  Take 10 mLs (200 mg) by mouth every 4 (four) hours as needed for Cough or Congestion     insulin glargine 100 UNIT/ML injection  Dose: 40 Units  Commonly known as: LANTUS  Inject 40 Units into the skin nightly     * insulin lispro 100 UNIT/ML injection  Dose: 1-8 Units  Inject 1-8 Units into the skin 3 (three) times daily before meals Medium dose correction insulin. Add to prandial insulin dose if ordered. Give even if NPO.  If BG  greater than 180 mg/dL add the following:  For BG 181 - 200 mg/dL give additional 1 unit  For BG 201 - 250 mg/dL give additional 3 units  For BG 251 - 300 mg/dL give additional 5 units  For BG 301 - 350 mg/dL give additional 7 units  For BG greater than 350 mg/dL give additional 8 units     * insulin lispro 100 UNIT/ML injection  Dose: 1-4 Units  Inject 1-4 Units into the skin nightly Medium dose correction insulin. Give even if NPO.  If BG greater than 180 mg/dL add the following:  For BG 181 - 200 mg/dL give 1 unit  For BG 201 - 250 mg/dL give 2 units  For BG 251 - 300 mg/dL give 3 units  For BG 301 - 350 mg/dL give 4 units  For BG greater than 350 mg/dL give 4 units     * insulin lispro 100 UNIT/ML injection  Dose: 8 Units  Inject 8 Units into the skin 3 (three) times daily before meals     lactobacillus/streptococcus Caps  Dose: 1 capsule  Take 1 capsule by mouth daily     lidocaine 5 %  Dose: 1 patch  Commonly known as: LIDODERM  Place 1 patch onto the skin every 24 hours Remove & Discard patch within 12 hours or as directed by MD     mupirocin 2 % ointment  Commonly known as: BACTROBAN  Apply topically daily USe BID for 2 Weeks     oxyCODONE-acetaminophen 5-325 MG per tablet  Dose: 1 tablet  Commonly known as: PERCOCET  Take 1 tablet by mouth every 4 (four) hours as needed for Pain     pantoprazole 40 MG tablet  Dose: 40 mg  Commonly known as: PROTONIX  Take 40 mg by mouth daily     petrolatum ointment  Apply topically every 12 (twelve) hours     polyethylene glycol 17 g packet  Dose: 17 g  Commonly known as: MIRALAX  Take 17 g by mouth daily as needed (Constipation)     senna-docusate 8.6-50 MG per tablet  Dose: 2 tablet  Commonly known as: PERICOLACE  Take 2 tablets by mouth nightly     * sevelamer 800 MG tablet  Dose: 800 mg  What changed: Another medication with the same name was added. Make sure you understand how and when to take each.  Commonly known as: RENVELA  Take 800 mg by  mouth 2 (two) times daily  with meals     * sevelamer 800 MG tablet  Dose: 800 mg  What changed: You were already taking a medication with the same name, and this prescription was added. Make sure you understand how and when to take each.  Commonly known as: RENVELA  Take 1 tablet (800 mg) by mouth 3 (three) times daily with meals           * This list has 7 medication(s) that are the same as other medications prescribed for you. Read the directions carefully, and ask your doctor or other care provider to review them with you.                STOP taking these medications      insulin aspart 100 UNIT/ML injection  Commonly known as: [facility] Course:   Details of admission and consultations on record.  [name] is a 66 y.o. female history of COPD, type 2 diabetes mellitus, hypertension, hyperlipidemia, COPD, ESRD-DD MWF presented to the emergency room after a fall , patient stated that she had lost her balance she was complaining of left chest pain left breast was swollen she also was complaining of right foot pain right great toe denies change in visual acuity evulsed patient was in shower and slipped no cardiac no neurological prodromal symptoms.  She was admitted to hospital for short period patient was not responding she had metabolic encephalopathy RRT was called patient got 1 chest compression and then she woke up she was found with hypoxia head CT was negative.  Patient was admitted to hospital for further management.  Hospital course:  She has been complaining of chest pain on the left side left breast was swollen tender and had injury from fall, patient was seen by cardiology in consultation given chest pain being reproducible medical management was continued.  Reference to her breast patient was treated with antibiotic for MRSA infection already she was started on Rocephin currently she is on vancomycin until May 27, 2021 for MRSA infection and abdominal wall.  Patient was consulted by  infectious diseases, oncology regarding left breast swelling and possibility of inflammatory breast cancer, ultrasound was obtained which shows changes of inflammation however with progressive dialysis and antibiotic patient's breast swelling is much better.  I spoke to Dr. Foye Clock Breast surgeon   patient will be followed by Dr. Foye Clock  as outpatient for further management.  Rib cage x-ray was negative for fracture.  Patient had acute metabolic encephalopathy-resolved she is back to her baseline.  Her fall was related to slipping in the bathroom-mechanical she injured her lip and chin both places are healing.  She was recently diagnosed with abdominal wall abscess with MRSA at PD catheter site patient is on dialysis and is receiving vancomycin per dialysis center May 27, 2021.  She had hyperkalemia in the setting of underlying end-stage renal disease patient had been receiving dialysis per schedule.  Potassium level is stable.  She had right great toenail avulsion and wound podiatry was consulted, nail was removed she arterial Doppler which shows PAD/PVR infrainguinal disease bilaterally, patient was seen by interventional radiology no intervention is suggested at this point if she does not heal on her right great toe she should get angiogram for further evaluation and management with interventional radiology.  Patient has pulmonary nodules bilaterally , advised  to follow-up with pulmonary medicine.  She has history of COPD, chronic respiratory failure uses oxygen as needed at home.  She is diabetic hypertensive has history of thrombocytopenia likely ITP, has been seen by hematology recommend follow-up with hematology after discharge.  Patient has secondary hyperparathyroidism from renal disease.  Also has obstructive sleep apnea needs to use CPAP .  Patient has generalized debility, was seen by occupational and physical therapy, is is recommended to go to skilled nursing facility patient is agreeable Case management  assisting for discharge planning.  Further hospital course per daily notes.  DCP to SNF per arrangements , d/w Pt and Case manager .  Msg Left for husband .  Laboratory Data     CBC  Recent Labs   Lab 04/30/21  0412 04/29/21  1022 04/28/21  1036 04/27/21  0542   WBC 6.68 8.06 7.28 7.37   Hgb 9.7* 10.8* 11.0* 10.7*   Hematocrit 32.9* 35.9 37.3 35.5   Platelets 90* 107* 112* 106*   MCV 94.8 93.2 96.9* 95.9       CMP  Recent Labs   Lab 04/30/21  0412 04/29/21  1022 04/28/21  1036 04/27/21  0542   Sodium 139 137 137 138   Potassium 4.7 4.5 4.4 4.3   Chloride 103 102 103 104   CO2 28 25 24 24    BUN 40.0* 49.0* 37.0* 54.0*   Creatinine 4.3* 5.4* 4.7* 5.7*   Glucose 264* 152* 234* 189*   Calcium 8.3* 8.3* 8.0* 7.5*       Lipid panel              Lab Results   Component Value Date    TSH 0.97 02/05/2021       Cardiac enzymes                Culture:   Microbiology Results (last 15 days)       Procedure Component Value Units Date/Time    COVID-19 (SARS-CoV-2) only ([facility]) asymptomatic admission [[id number]] Collected: 04/23/21 1352    Order Status: Completed Specimen: Nasopharyngeal Updated: 04/23/21 1429     Purpose of COVID testing Screening     SARS-CoV-2 Specimen Source Nasal Swab     SARS CoV 2 Overall Result Not Detected     Comment: __________________________________________________  -A result of "Detected" indicates POSITIVE for the    presence of SARS CoV-2 RNA  -A result of "Not Detected" indicates NEGATIVE for the    presence of SARS CoV-2 RNA  __________________________________________________________  Test performed using the Roche cobas [facility] SARS-CoV-2 assay. This assay is  only for use under the Food and Drug Administration's Emergency Use  Authorization. This is a real-time RT-PCR assay for the qualitative  detection of SARS-CoV-2 RNA. Viral nucleic acids may persist in vivo,  independent of viability. Detection of viral nucleic acid does not imply the  presence of infectious virus, or that virus nucleic  acid is the cause of  clinical symptoms. Negative results do not preclude SARS-CoV-2 infection and  should not be used as the sole basis for diagnosis, treatment or other  patient management decisions. Negative results must be combined with  clinical observations, patient history, and/or epidemiological information.  Invalid results may be due to inhibiting substances in the specimen and  recollection should occur. Please see Fact Sheets for patients and providers  located:  https://www.benson-chung.com/         Narrative:      o Collect and clearly label  specimen type:  o PREFERRED-Upper respiratory specimen: One Nasal Swab in  Transport Media.  o Hand deliver to laboratory ASAP  Indication for testing->Extended care facility admission to  semi private room  Screening            All radiology result for current encounter:  US Breast Left Ltd    Result Date: 04/29/2021   1. Increased skin thickening and edema as well as increased left axillary lymphadenopathy. Given that these findings predate the patient's fall and are worsening, inflammatory breast cancer must be excluded. Recommend breast surgery consultation and skin punch biopsy. When the patient is an outpatient, diagnostic mammogram and follow-up ultrasound should be performed. BI-RADS: 4, suspicious Earney Navy, MD 04/29/2021 2:53 PM    XR Ribs Left W PA Chest    Result Date: 04/27/2021  1. No plain film evidence of fracture or osseous destruction. 2. New interstitial nodular infiltrates in the mid and lower lung field which could represent infection or atypical congestive heart failure. No pleural effusion Jennell Corner, MD 04/27/2021 9:11 AM     Echo Results       None              Physical Exam:   BP 142/63   Pulse 62   Temp 98.2 F (36.8 C) (Oral)   Resp (!) 26   Ht 1.575 m (5\' 2" )   Wt 101.2 kg (223 lb 1.7 oz)   LMP  (LMP Unknown) Comment: post menopausal  SpO2 97%   BMI 40.81 kg/m     General appearance - alert, and in no  distress  Obese BMI 42  HEENT: Normocephalic,atraumatic, pupil equal  Neck - supple  Chest - clear to auscultation  Heart - normal rate and regular rhythm  Abdomen - bowel sounds normal, soft, non distended  Extremities - +ve Left Breast edema , right great toe nail Off   Neurological - Alert , No Gross Focal deficits     Discharge condition:   Stable     Discharge  Diet :   Cardiac     Discharge Instructions:   Follow up:     Philipp Ovens, MD  [address]  [place] [address]  [phone]    Schedule an appointment as soon as possible for a visit      [address], [address]  [place] [address]  [phone]          Cindi Carbon, [address]  [place]  [place] [address]  [phone]    Follow up      Sena Slate, MD  [address]  [place] [address]  [phone]    Follow up  COPD , Lung Nodule    Sherri Rad, [place]  [facility] Interventional Radiology  [place] [address]  [phone]    Follow up  for PAD        Barbaraann Faster, MD  05/03/2021  10:10 AM  Time spent 45  minutes

## 2021-05-03 LAB — VANCOMYCIN, RANDOM: Vancomycin Random: 23.9 ug/mL

## 2021-05-03 LAB — GLUCOSE WHOLE BLOOD - POCT
Whole Blood Glucose POCT: 105 mg/dL — ABNORMAL HIGH (ref 70–100)
Whole Blood Glucose POCT: 131 mg/dL — ABNORMAL HIGH (ref 70–100)

## 2021-05-03 LAB — WHOLE BLOOD GLUCOSE POCT
Whole Blood Glucose POCT: 144 mg/dL — ABNORMAL HIGH (ref 70–100)
Whole Blood Glucose POCT: 276 mg/dL — ABNORMAL HIGH (ref 70–100)

## 2021-05-03 MED ORDER — INSULIN LISPRO 100 UNIT/ML SOLN (WRAP)
1.0000 [IU] | Freq: Three times a day (TID) | Status: DC
Start: 2021-05-03 — End: 2021-06-22

## 2021-05-03 MED ORDER — SODIUM CHLORIDE 0.9 % IV BOLUS
100.0000 mL | INTRAVENOUS | Status: AC | PRN
Start: 2021-05-03 — End: 2021-05-03

## 2021-05-03 MED ORDER — INSULIN LISPRO 100 UNIT/ML SOLN (WRAP)
8.0000 [IU] | Freq: Three times a day (TID) | Status: DC
Start: 2021-05-03 — End: 2021-06-22

## 2021-05-03 MED ORDER — SODIUM CHLORIDE 0.9 % IV BOLUS
250.0000 mL | INTRAVENOUS | Status: AC | PRN
Start: 2021-05-03 — End: 2021-05-03

## 2021-05-03 MED ORDER — ACETAMINOPHEN 325 MG PO TABS
650.0000 mg | ORAL_TABLET | Freq: Four times a day (QID) | ORAL | 0 refills | Status: DC | PRN
Start: 2021-05-03 — End: 2021-06-22

## 2021-05-03 MED ORDER — INSULIN GLARGINE 100 UNIT/ML SC SOLN
40.0000 [IU] | Freq: Every evening | SUBCUTANEOUS | Status: DC
Start: 2021-05-03 — End: 2021-06-22

## 2021-05-03 MED ORDER — VANCOMYCIN HCL IN NACL 750-0.9 MG/250ML-% IV SOLN
750.0000 mg | Freq: Once | INTRAVENOUS | Status: AC
Start: 2021-05-03 — End: 2021-05-03
  Administered 2021-05-03: 750 mg via INTRAVENOUS
  Filled 2021-05-03: qty 750

## 2021-05-03 MED ORDER — INSULIN LISPRO 100 UNIT/ML SOLN (WRAP)
1.0000 [IU] | Freq: Every evening | Status: DC
Start: 2021-05-03 — End: 2021-06-22

## 2021-05-03 MED ORDER — ALBUMIN HUMAN 25 % IV SOLN
100.0000 mL | INTRAVENOUS | Status: AC | PRN
Start: 2021-05-03 — End: 2021-05-03
  Administered 2021-05-03: 25 g via INTRAVENOUS
  Filled 2021-05-03: qty 100

## 2021-05-03 MED ORDER — SEVELAMER CARBONATE 800 MG PO TABS
800.0000 mg | ORAL_TABLET | Freq: Three times a day (TID) | ORAL | 0 refills | Status: DC
Start: 2021-05-03 — End: 2021-06-22

## 2021-05-03 NOTE — Plan of Care (Addendum)
Hemodialysis for 3 hrs , Net fluid 3L.  -- Dialysis RN admin 100 mL of Albumin 25% bag to manage hypotension    GI: Low Na+, Fluid restriction 1200 mL/day  GU: No BM and <200 mL UOP, amber color  IV: IV VANCOCIN  Pain: Pt stated pain on chest, superficial. Not pain of the actual heart organ. Lidocaine Patch on chest    Update provided to daughter, Jonelle Sidle, over phone.    Problem: Fluid and Electrolyte Imbalance/ Endocrine  Goal: Fluid and electrolyte balance are achieved/maintained  Outcome: Progressing  Flowsheets (Taken 05/03/2021 1133)  Fluid and electrolyte balance are achieved/maintained:   Monitor intake and output every shift   Monitor/assess lab values and report abnormal values   Provide adequate hydration   Monitor daily weight   Assess for confusion/personality changes   Assess and reassess fluid and electrolyte status     Problem: Nutrition  Goal: Nutritional intake is adequate  Outcome: Progressing  Flowsheets (Taken 05/01/2021 1105)  Nutritional intake is adequate:   Monitor daily weights   Allow adequate time for meals   Assist patient with meals/food selection   Encourage/perform oral hygiene as appropriate     Problem: Diabetes: Glucose Imbalance  Goal: Blood glucose stable at established goal  Outcome: Progressing  Flowsheets (Taken 05/01/2021 0215 by Orson Aloe, RN)  Blood glucose stable at established goal:   Monitor lab values   Include patient/family in decisions related to nutrition/dietary selections   Coordinate medication administration with meals, as indicated   Ensure appropriate consults are obtained (Nutrition, Diabetes Education, and Case Management)   Monitor intake and output.  Notify LIP if urine output is < 30 mL/hour.   Ensure appropriate diet and assess tolerance   Assess for hypoglycemia /hyperglycemia   Follow fluid restrictions/IV/PO parameters   Monitor/assess vital signs   Ensure adequate hydration   Ensure patient/family has adequate teaching materials     Problem: Renal  Instability  Goal: Nutritional intake is adequate  Outcome: Progressing  Flowsheets (Taken 05/01/2021 1105)  Nutritional intake is adequate:   Monitor daily weights   Allow adequate time for meals   Assist patient with meals/food selection   Encourage/perform oral hygiene as appropriate     Problem: Compromised Tissue integrity  Goal: Damaged tissue is healing and protected  Outcome: Progressing  Flowsheets (Taken 05/03/2021 1133)  Damaged tissue is healing and protected:   Monitor/assess Braden scale every shift   Reposition patient every 2 hours and as needed unless able to reposition self   Relieve pressure to bony prominences for patients at moderate and high risk   Keep intact skin clean and dry   Provide wound care per wound care algorithm   Avoid shearing injuries   Increase activity as tolerated/progressive mobility   Use bath wipes, not soap and water, for daily bathing

## 2021-05-03 NOTE — Plan of Care (Signed)
Problem: Moderate/High Fall Risk Score >5  Goal: Patient will remain free of falls  Outcome: Progressing     Problem: Fluid and Electrolyte Imbalance/ Endocrine  Goal: Fluid and electrolyte balance are achieved/maintained  Outcome: Progressing  Goal: Adequate hydration  Outcome: Progressing     Problem: Nutrition  Goal: Nutritional intake is adequate  Outcome: Progressing  Goal: Patient maintains weight  Outcome: Progressing  Goal: Food and/or nutrient delivery  Outcome: Progressing     Problem: Diabetes: Glucose Imbalance  Goal: Blood glucose stable at established goal  Outcome: Progressing     Problem: Potential for Aspiration  Goal: Risk of aspiration will be minimized  Outcome: Progressing     Problem: Renal Instability  Goal: Nutritional intake is adequate  Outcome: Progressing  Goal: Fluid and electrolyte balance are achieved/maintained  Outcome: Progressing  Goal: Free from infection  Outcome: Progressing     Problem: Safety  Goal: Patient will be free from injury during hospitalization  Outcome: Progressing  Goal: Patient will be free from infection during hospitalization  Outcome: Progressing     Problem: Pain  Goal: Pain at adequate level as identified by patient  Outcome: Progressing     Problem: Side Effects from Pain Analgesia  Goal: Patient will experience minimal side effects of analgesic therapy  Outcome: Progressing     Problem: Discharge Barriers  Goal: Patient will be discharged home or other facility with appropriate resources  Outcome: Progressing     Problem: Compromised Tissue integrity  Goal: Damaged tissue is healing and protected  Outcome: Progressing  Goal: Nutritional status is improving  Outcome: Progressing     Problem: Patient Receiving Advanced Renal Therapies  Goal: Therapy access site remains intact  Outcome: Progressing

## 2021-05-03 NOTE — Progress Notes (Addendum)
[facility] PROGRESS NOTE  [id number]    Date Time: 05/03/21 10:32 AM  Patient Name: [name]  Attending Physician: Barbaraann Faster, MD    CC: follow-up ESRD    Assessment:     ESRD on HD MWF at [facility] - followed by Dr. Randel Pigg  - noncompliant with HD  Hyperkalemia - resolved  Mild hypocalcemia due to CKD  Anemia in CKD - lower today  HTN - controlled  DM type 2 - uncontrolled  Hyperphosphatemia: better  Secondary hyperparathyroidism: PTH 1579 on 3/23  Left breast swelling - seen by onc, ddx includes malignancy  Abdominal wall abscess MRSA on vancomycin until 5/5    Recommendations:     1. HD today  2. HD again tomorrow if still inhouse   3. Fluid restriction 1.5L/day   4. Check Pro BNP , if more elevated then baseline - will plan echo as o/p   5. Stable for discharge to SNF [facility] rehab)      Case discussed with: pt    Gaylyn Lambert, MD  [facility]  [id number] (office)    Subjective:   On 3L NC , (Tolerated 3.9L UF yesterday)  C/o chest discomfort - on pain meds - somnolent     Review of Systems:     No cp  No sob      Physical Exam:     Vitals:    05/03/21 0355 05/03/21 0431 05/03/21 0750 05/03/21 0829   BP:  138/63 142/63    Pulse:   62 62   Resp: (!) 11 18 (!) 26    Temp:  98.2 F (36.8 C) 98.2 F (36.8 C)    TempSrc:  Axillary Oral    SpO2: 100%  97%    Weight:  101.2 kg (223 lb 1.7 oz)     Height:         Intake and Output Summary (Last 24 hours) at Date Time    Intake/Output Summary (Last 24 hours) at 05/03/2021 1032  Last data filed at 05/02/2021 1900  Gross per 24 hour   Intake 236 ml   Output 3500 ml   Net -3264 ml       General: awake, alert, oriented x 3, no acute distress  Cardiovascular: regular rate and rhythm, no murmurs, rubs or gallops  Lungs: clear to auscultation bilaterally, without wheezing, rhonchi, or rales  Abdomen: soft, non-tender, non-distended, normoactive bowel sounds  Extremities: +LE edema    Meds:      Scheduled Meds: PRN Meds:     amLODIPine, 10 mg, Oral, Daily  atorvastatin, 40 mg, Oral, Daily  carvedilol, 25 mg, Oral, Q12H SCH  dextrose, 50 g, Intravenous, Once  gabapentin, 100 mg, Oral, Q8H SCH  heparin (porcine), 5,000 Units, Subcutaneous, Q12H SCH  insulin glargine, 40 Units, Subcutaneous, QHS  insulin lispro, 1-4 Units, Subcutaneous, QHS  insulin lispro, 1-8 Units, Subcutaneous, TID AC  insulin lispro, 8 Units, Subcutaneous, TID AC  lidocaine, 1 patch, Transdermal, Q24H  petrolatum, , Topical, Q12H  senna-docusate, 2 tablet, Oral, QHS  sevelamer, 800 mg, Oral, TID MEALS  vancomycin, , Intravenous, See Admin Instructions          Continuous Infusions:   acetaminophen, 650 mg, Q6H PRN   Or  acetaminophen, 650 mg, Q6H PRN  albuterol-ipratropium, 3 mL, Q6H PRN  dextrose, 15 g of glucose, PRN   Or  dextrose, 12.5 g, PRN   Or  dextrose, 12.5 g, PRN   Or  glucagon (rDNA), 1 mg, PRN  guaiFENesin, 200 mg, Q4H PRN  hydrALAZINE, 10 mg, Q6H PRN  lactulose, 10 g, Daily PRN  magnesium sulfate, 1 g, PRN  melatonin, 3 mg, QHS PRN  naloxone, 0.2 mg, PRN  ondansetron, 4 mg, Q6H PRN   Or  ondansetron, 4 mg, Q6H PRN  oxyCODONE-acetaminophen, 1 tablet, Q4H PRN  polyethylene glycol, 17 g, Daily PRN  sodium chloride, 100 mL, Q1H PRN  sodium chloride, 250 mL, PRN          Labs:     Recent Labs   Lab 04/30/21  0412 04/29/21  1022 04/28/21  1036   WBC 6.68 8.06 7.28   Hgb 9.7* 10.8* 11.0*   Hematocrit 32.9* 35.9 37.3   Platelets 90* 107* 112*       Recent Labs   Lab 04/30/21  0412 04/29/21  1022 04/28/21  1036   Sodium 139 137 137   Potassium 4.7 4.5 4.4   Chloride 103 102 103   CO2 28 25 24    BUN 40.0* 49.0* 37.0*   Creatinine 4.3* 5.4* 4.7*   Calcium 8.3* 8.3* 8.0*   Glucose 264* 152* 234*   EGFR 12.5 9.6 11.2             Invalid input(s): LEUKOCYTESUR      Imaging personally reviewed, including: No results found.    Signed by: Gaylyn Lambert, MD

## 2021-05-03 NOTE — Progress Notes (Addendum)
DELAYED [place]  CM spoke with Sandria Senter from [employer]. Auth still pending. Dr. Felipe Drone aware. CM will continue to follow.  Cm faxed requested EKG and PASRR to [facility]. CM left pt's spouse VM of DCP status. Cm will continue to follow.    [facility]  [phone]

## 2021-05-03 NOTE — Plan of Care (Signed)
Problem: Renal Instability  Goal: Fluid and electrolyte balance are achieved/maintained  Outcome: Progressing  Flowsheets (Taken 05/03/2021 1638)  Fluid and electrolyte balance are achieved/maintained:   Monitor daily weight   Monitor/assess lab values and report abnormal values   Observe for cardiac arrhythmias   Assess for confusion/personality changes  Goal: Free from infection  Outcome: Progressing  Flowsheets (Taken 05/03/2021 1638)  Free from infection: Monitor/assess for signs and symptoms of infection     Problem: Patient Receiving Advanced Renal Therapies  Goal: Therapy access site remains intact  Outcome: Progressing  Flowsheets (Taken 05/03/2021 1638)  Therapy access site remains intact: Assess therapy access site

## 2021-05-03 NOTE — Progress Notes (Signed)
Arrived in room, pt is contact isolation.  Pt is alert and oriented.  Pt denied pain and sob.  LAVF access is optimally working. Health education rendered to pt.  Timeouts and safety checks done. Will closely monitor the pt.        05/03/21 1515   Bedside Nurse Communication   Name of bedside RN - pre dialysis Aaron Mose, RN   Treatment Initiation- With Dialysis Precautions   Time Out/Safety Check Completed Yes   Consent for HD signed for this hospitalization (Date) 02/04/21   Consent for HD signed for this hospitalization (Time) 1450   Blood Consent Verified N/A   Dialysis Precautions All Connections Secured;Saline Line Double Clamped;Venous Parameters Set;Arterial Parameters Set;Air Foam Detecctor Engaged   Dialysis Treatment Type   (extra tx)   Special Considerations contact isolation   Is patient diabetic? Yes   RO/Hemodialysis Architectural technologist   Is Total Chlorine less than 0.1 ppm? Yes   Orignial Total Chlorine Testing Time 1440   At 4 Hour Total Chlorine Testing Time 1840   RO/Hemodialysis Facilities manager Number 1    Machine Serial Number [id number]   RO # 25   RO Serial # [id number]   pH 7.2   Pressure Test Verified Yes   Alarms Verified Passed   Machine Temperature 98.6 F (37 C)   Alarms Verified Yes   Na+ mEq (Machine) 138 mEq   Bicarb mEq (Machine) 35 mEq   Hemodialysis Conductivity (Machine) 13.8   Hemodialysis Conductivity (Meter) 13.8   Dialyzer Lot Number [id number]   Tubing Lot Number [id number]   RO Machine Log Completed Yes   Hepatitis Status   HBsAg (Antigen) Result Negative   HBsAg Date Drawn 04/13/21   HBsAg Repeat Draw Due Date 05/11/21   Dialysis Weight   Pre-Treatment Weight (Kg) 102.9   Scale Type ICU Bed Scale   Vitals   Heart Rate 64   Resp Rate 18   BP 102/51   SpO2 96 %   O2 Device Nasal cannula   Assessment   Mental Status Alert;Oriented   Cardiac Regularity Regular   Cardiac Rhythm Normal Sinus Rhythm   Respiratory Pattern Regular   Bilateral Breath Sounds  Diminished   R Breath Sounds Diminished   L Breath Sounds Diminished   RLE Edema +2   LLE Edema +2   General Skin Color Appropriate for ethnicity   Skin Condition/Temp Warm;Dry   Abdomen Inspection Soft;Rounded   GI Symptoms None   Mobility Bed   Graft/Fistula Left   No placement date or time found.   Access Type: Arteriovenous Fistula  Orientation: Left  Graft/Fistula Location: Upper Arm   Fistula/ Graft Assessment Abnormalities WDL   Pain Assessment   Charting Type Assessment   Pain Scale Used Numeric Scale (0-10)   Numeric Pain Scale   Pain Score 0   POSS Score 1   Hemodialysis Comments   Pre-Hemodialysis Comments Timeout and safety checks done

## 2021-05-03 NOTE — Progress Notes (Signed)
Discharge planning initiated, discharge summary completed.  Discussed with case management, patient waiting for discharge per authorization.

## 2021-05-03 NOTE — Progress Notes (Signed)
Pt is contact isolation, PPE done per protocol. Pt awake and oriented. Pt denied pain and sob. No other complain given. LAVF  access is optimally working post dialysis. No bleeding post tx.  Hemostasis achieved. BP soft during start of HD tx, albumin 25% given as ordered for BP support. Hemodialysis ended and pt. able to finish tx.  Able to removed Net Fluid of 3L.  Health teaching given on fluid management.  Reports were given to Primary nurse.        05/03/21 1844   Treatment Summary   Time Off Machine 1820   Duration of Treatment (Hours) 3   Treatment Type 1:1   Dialyzer Clearance Moderately streaked   Fluid Volume Off (mL) 3500   Prime Volume (mL) 200   Rinseback Volume (mL) 200   Fluid Given: Normal Saline (mL) 0   Fluid Given: PRBC  0 mL   Fluid Given: Albumin (mL) 100   Fluid Given: Other (mL) 0   Total Fluid Given 500   Hemodialysis Net Fluid Removed 3000   Post Treatment Assessment   Post-Treatment Weight (Kg)   (-3L.)   Patient Response to Treatment fairly tolerated   Additional Dialyzer Used 0   Graft/Fistula Left   No placement date or time found.   Access Type: Arteriovenous Fistula  Orientation: Left  Graft/Fistula Location: Upper Arm   Fistula/ Graft Assessment Abnormalities WDL   Needle Size 16 Gauge   Cannulation Sites held Arterial (min) 10   Cannulation Sites held Venous (min) 8   Hemostasis Achieved Yes   Vitals   Temp 97.3 F (36.3 C)   Heart Rate 63   Resp Rate 19   BP 140/65   SpO2 98 %   O2 Device Nasal cannula   Assessment   Mental Status Alert;Oriented   Cardiac Regularity Regular   Cardiac Rhythm Normal Sinus Rhythm   Respiratory Pattern Regular   Bilateral Breath Sounds Diminished   R Breath Sounds Diminished   L Breath Sounds Diminished   RLE Edema +1   LLE Edema +1   General Skin Color Appropriate for ethnicity   Skin Condition/Temp Warm;Dry   Abdomen Inspection Soft;Rounded   GI Symptoms None   Mobility Bed   Pain Assessment   Charting Type Reassessment   Pain Scale Used Numeric  Scale (0-10)   Numeric Pain Scale   Pain Score 0   POSS Score 1   Education   Person taught Patient   Knowledge basis Substantial   Topics taught Fluid Management   Teaching Tools Explain   Reponse Verbalizes Understanding   Bedside Nurse Communication   Name of bedside RN - post dialysis Aaron Mose, RN.

## 2021-05-03 NOTE — Progress Notes (Signed)
PULMONARY PROGRESS NOTE                                                                                                              [phone]    Date Time: 05/03/21 5:02 PM  Patient Name: [name], [name] 66 y.o. female admitted with Altered mental status  Admit Date: 04/23/2021    Patient status: Observation  Hospital Day: 0           Assessment:   Musculoskeletal chest pain  Subcentimeter pulmonary nodules  Obstructive sleep apnea  End-stage renal disease  Swelling of the left breast suspicious for malignancy  Hypertension  Hyperlipidemia  Plan:   Consulted breast surgeon outpatient biopsy recommended  Dialysis as per nephrology  Nocturnal CPAP  Glycemic control  Follow pulmonary nodule outpatient, repeat CT scan in 3 to 6 months  Off antibiotics  DVT prophylaxis  Follow-up with pulmonary for pulmonary nodules  Subjective:     Patient continues to have reproducible musculoskeletal chest pain      Medications:     Current Facility-Administered Medications   Medication Dose Route Frequency    amLODIPine  10 mg Oral Daily    atorvastatin  40 mg Oral Daily    carvedilol  25 mg Oral Q12H SCH    dextrose  50 g Intravenous Once    gabapentin  100 mg Oral Q8H [place]    heparin (porcine)  5,000 Units Subcutaneous Q12H [place]    insulin glargine  40 Units Subcutaneous QHS    insulin lispro  1-4 Units Subcutaneous QHS    insulin lispro  1-8 Units Subcutaneous TID AC    insulin lispro  8 Units Subcutaneous TID AC    lidocaine  1 patch Transdermal Q24H    petrolatum   Topical Q12H    senna-docusate  2 tablet Oral QHS    sevelamer  800 mg Oral TID MEALS    vancomycin  750 mg Intravenous Once    vancomycin   Intravenous See Admin Instructions       Review of Systems:        General ROS: negative for - chills, fever or night sweats  Ophthalmic ROS: negative for - double vision, excessive tearing, itchy eyes or photophobia  ENT ROS: negative for - headaches, nasal congestion, sore throat or vertigo  Respiratory ROS: Positive for  musculoskeletal chest pain   cardiovascular ROS: negative for - chest pain, edema, orthopnea, rapid heart rate  Gastrointestinal ROS: negative for - abdominal pain, blood in stools, constipation, diarrhea, heartburn or nausea/vomiting  Musculoskeletal ROS: negative for - muscle pain  Neurological ROS: negative for - confusion, dizziness, headaches, speech problems, visual changes or weakness  Dermatological ROS: negative for dry skin, pruritus and rash      Physical Exam:     Vitals:    05/03/21 1700   BP:    Pulse: 61   Resp: 20   Temp:    SpO2: 100%         Intake/Output Summary (Last 24 hours) at  05/03/2021 1702  Last data filed at 05/03/2021 0940  Gross per 24 hour   Intake 118 ml   Output 3500 ml   Net -3382 ml            General appearance - alert,  and in no distress  Mental status - alert, oriented to person, place, and time  Eyes - pupils equal and reactive, extraocular eye movements intact  Ears - no external ear lesions seen  Nose - no nasal discharge   Mouth - clear oral mucosa, moist   Neck - supple, no significant adenopathy  Chest -clear to auscultation.  Left breast swelling appears to be improving  Heart - normal rate, regular rhythm, normal S1, S2, no murmurs, rubs, clicks or gallops  Abdomen - soft, nontender, nondistended, no masses or organomegaly  Neurological - alert, oriented, normal speech, no focal findings or movement disorder noted  Musculoskeletal - no joint swelling or tenderness  Extremities -  no pedal edema, no clubbing or cyanosis  Skin - normal coloration, no rashes      Labs:     CBC w/Diff CMP   Recent Labs   Lab 04/30/21  0412 04/29/21  1022 04/28/21  1036   WBC 6.68 8.06 7.28   Hgb 9.7* 10.8* 11.0*   Hematocrit 32.9* 35.9 37.3   Platelets 90* 107* 112*   MCV 94.8 93.2 96.9*         PT/INR         Recent Labs   Lab 04/30/21  0412 04/29/21  1022 04/28/21  1036   Sodium 139 137 137   Potassium 4.7 4.5 4.4   Chloride 103 102 103   CO2 28 25 24    BUN 40.0* 49.0* 37.0*   Creatinine  4.3* 5.4* 4.7*   Glucose 264* 152* 234*   Calcium 8.3* 8.3* 8.0*        Glucose POCT   Recent Labs   Lab 04/30/21  0412 04/29/21  1022 04/28/21  1036 04/27/21  0542   Glucose 264* 152* 234* 189*                    ABGs:    ABG CollectionSite   Date Value Ref Range Status   04/23/2021 Right Radl  Final     Allen's Test   Date Value Ref Range Status   04/23/2021 Yes  Final     pH, Arterial   Date Value Ref Range Status   04/23/2021 7.242 (L) 7.350 - 7.450 Final     pCO2, Arterial   Date Value Ref Range Status   04/23/2021 59.5 (H) 35.0 - 45.0 mmhg Final     pO2, Arterial   Date Value Ref Range Status   04/23/2021 <42.7 (L) 80.0 - 90.0 mmhg Final     Comment:     Critical pO2 results called to [id number].  Readback confirmed by MM at  04/23/2021  22:37       HCO3, Arterial   Date Value Ref Range Status   04/23/2021 24.7 23.0 - 29.0 mEq/L Final     Base Excess, Arterial   Date Value Ref Range Status   04/23/2021 -3.0 (L) -2.0 - 2.0 mEq/L Final     O2 Sat, Arterial   Date Value Ref Range Status   04/23/2021 45.3 (L) 95.0 - 100.0 % Final       Urinalysis        Invalid input(s): LEUKOCYTESUR      Rads:  No results found.      Quillian Quince MD  05/03/2021  5:02 PM

## 2021-05-03 NOTE — Progress Notes (Signed)
Progress Note- Pharmacy Vancomycin Dosing Consult:  Day 10, Vanco with dialysis till 05/27/2021  Vancomycin Indication: MRSA Abdominal Wall Infection  Vancomycin Monitoring Goal: Pre-HD 15-25 mcg/mL, dose by level    Pertinent Cultures:   Wcx heavy MRSA      Serum creatinine: 4.3 mg/dL (H) 04/30/21 0412  Estimated creatinine clearance: 14.3 mL/min (A)    Assessment   Measured Vancomycin Level:   Recent Labs   Lab 05/03/21  0446 05/02/21  1531 04/29/21  1022 04/28/21  1036 04/27/21  0542   Vancomycin Random 23.9 17.6 25.2 21.9 18.1         Recommendations:  Will give 750 mg x1 post dialysis   Next pre-HD Random Vancomycin level on 04/12 w/ AM labs and will dose accordingly.    Thank you,  Lucia Bitter, [place]

## 2021-05-03 NOTE — Progress Notes (Signed)
PROGRESS NOTE     Date Time: 05/03/21 8:29 AM  Patient Name: [name],[name]      ASSESSMENT:   [place] orange type skin changes of the left breast without overt masses, erythema-  likely dependent edema -concern for malignancy on USG- outpatient biopsy recommended- improving post dialysis  Acute on Chronic Thrombocytopenia, likely ITP- post infectious exacerbation- stable  Chronic Normocytic Anemia, due to CKD - stable  Subcentimeter pulmonary nodule- pulmonology following  Acute encephalopathy, status post RRT( chest compression x 1 ) , found with hypoxia, head CT negative.- at baseline  Status post mechanical fall , slipped in bathroom injury to lower lip, chin.  Injury to left chest wall, edema, X Ray Neg for Fx, right great toe from fall  Chest pain, likely Musculoskeletal  Recent abdominal wall abscess MRSA at PD catheter site on Vanco at dialysis.  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 Last labs on 4/7  Continue iron repletion with dialysis and ESA therapy  Continue Local Wound Care  Pulmonary nodules- follow repeat CT scan in 3-6 months  Continue vanco with dialysis  till May 27, 2021.  Continue pain management  DVT ppx  Continue home meds for comorbid conditions    Subjective:     Patient continues to have reproducible musculoskeletal chest pain  On nocturnal CPAP  Denies sob   No nausea vomiting      Medications:      Scheduled Meds: PRN Meds:    amLODIPine, 10 mg, Oral, Daily  atorvastatin, 40 mg, Oral,  Daily  carvedilol, 25 mg, Oral, Q12H SCH  dextrose, 50 g, Intravenous, Once  gabapentin, 100 mg, Oral, Q8H SCH  heparin (porcine), 5,000 Units, Subcutaneous, Q12H SCH  insulin glargine, 40 Units, Subcutaneous, QHS  insulin lispro, 1-4 Units, Subcutaneous, QHS  insulin lispro, 1-8 Units, Subcutaneous, TID AC  insulin lispro, 8 Units, Subcutaneous, TID AC  lidocaine, 1 patch, Transdermal, Q24H  petrolatum, , Topical, Q12H  senna-docusate, 2 tablet, Oral, QHS  sevelamer, 800 mg, Oral, TID MEALS  vancomycin, , Intravenous, See Admin Instructions         acetaminophen, 650 mg, Q6H PRN   Or  acetaminophen, 650 mg, Q6H PRN  albuterol-ipratropium, 3 mL, Q6H PRN  dextrose, 15 g of glucose, PRN   Or  dextrose, 12.5 g, PRN   Or  dextrose, 12.5 g, PRN   Or  glucagon (rDNA), 1 mg, PRN  guaiFENesin, 200 mg, Q4H PRN  hydrALAZINE, 10 mg, Q6H PRN  lactulose, 10 g, Daily PRN  magnesium sulfate, 1 g, PRN  melatonin, 3 mg, QHS PRN  naloxone, 0.2 mg, PRN  ondansetron, 4 mg, Q6H PRN   Or  ondansetron, 4 mg, Q6H PRN  oxyCODONE-acetaminophen, 1 tablet, Q4H PRN  polyethylene glycol, 17 g, Daily PRN          I personally reviewed all of the medications    Review of Systems:   Per narrative, 2 system review was done.  Physical Exam:   Visit Vitals  BP 142/63   Pulse 62   Temp 98.2 F (36.8 C) (Oral)   Resp (!) 26   Ht 1.575 m (5\' 2" )   Wt 101.2 kg (223 lb 1.7 oz)   LMP  (LMP Unknown)   SpO2 97%   BMI 40.81 kg/m         Intake/Output Summary (Last 24 hours) at 05/03/2021 0829  Last data filed at 05/02/2021 1900  Gross per 24 hour   Intake 236 ml   Output 3500 ml   Net -3264 ml            Physical Exam:      General: no acute distress.  Skin: no rashes or lesions.  HENT: no scleral icterus, no lymphadenopathy.  Lungs: Bilateral breath sounds anterior chest,   Bilateral breast examination was done and left breast appears tender to touch with some skin edema and pitting without any overt erythema  or palpable lumps.  Right breast exam was  normal.  Heart: normal rate, regular rhythm, no murmur.  Abdomen: soft, non-tender, no masses.  Musculoskeletal: Right great toenail removed.   Neurologic: alert, and oriented X3, CN normal speech, no focal findings or movement disorder noted.            Labs:     Recent Labs   Lab 04/30/21  0412 04/29/21  1022 04/28/21  1036   Glucose 264* 152* 234*   BUN 40.0* 49.0* 37.0*   Creatinine 4.3* 5.4* 4.7*   Calcium 8.3* 8.3* 8.0*   Sodium 139 137 137   Potassium 4.7 4.5 4.4   Chloride 103 102 103   CO2 28 25 24        Recent Labs   Lab 04/30/21  0412 04/29/21  1022 04/28/21  1036   WBC 6.68 8.06 7.28   Hgb 9.7* 10.8* 11.0*   Hematocrit 32.9* 35.9 37.3   MCV 94.8 93.2 96.9*   MCH 28.0 28.1 28.6   MCHC 29.5* 30.1* 29.5*   Platelets 90* 107* 112*         No results for input(s): PTT, PT, INR in the last 72 hours.      Microbiology:  Microbiology Results (last 15 days)       Procedure Component Value Units Date/Time    COVID-19 (SARS-CoV-2) only ([facility]) asymptomatic admission [[id number]] Collected: 04/23/21 1352    Order Status: Completed Specimen: Nasopharyngeal Updated: 04/23/21 1429     Purpose of COVID testing Screening     SARS-CoV-2 Specimen Source Nasal Swab     SARS CoV 2 Overall Result Not Detected     Comment: __________________________________________________  -A result of "Detected" indicates POSITIVE for the    presence of SARS CoV-2 RNA  -A result of "Not Detected" indicates NEGATIVE for the    presence of SARS CoV-2 RNA  __________________________________________________________  Test performed using the Roche cobas [facility] SARS-CoV-2 assay. This assay is  only for use under the Food and Drug Administration's Emergency Use  Authorization. This is a real-time RT-PCR assay for the qualitative  detection of SARS-CoV-2 RNA. Viral nucleic acids may persist in vivo,  independent of viability. Detection of viral nucleic acid does not imply the  presence of infectious virus, or that virus nucleic acid is the cause  of  clinical symptoms. Negative results do not preclude SARS-CoV-2 infection and  should not be used as the sole basis for diagnosis, treatment or other  patient management decisions. Negative  results must be combined with  clinical observations, patient history, and/or epidemiological information.  Invalid results may be due to inhibiting substances in the specimen and  recollection should occur. Please see Fact Sheets for patients and providers  located:  https://www.[employer].com/         Narrative:      o Collect and clearly label specimen type:  o PREFERRED-Upper respiratory specimen: One Nasal Swab in  Transport Media.  o Hand deliver to laboratory ASAP  Indication for testing->Extended care facility admission to  semi private room  Screening                Signed by: Cindi Carbon, MD

## 2021-05-03 NOTE — Discharge Summary (Signed)
DISCHARGE SUMMARY    Date Time: 05/03/21 10:10 AM  Patient Name: [name]  Attending Physician: Barbaraann Faster, MD    Date of Admission:   04/23/2021    Date of Discharge:   05/03/2021    Reason for Admission:   Altered mental status [R41.82]    Discharge Dx:   Present on Admission:   Altered mental status  Reproducible chest pain , left breast swelling status post chest compression x1.  Acute metabolic encephalopathy , s/p  RRT( chest compression x 1 ) , found with hypoxia, head CT negative.  Left breast swelling rib cage x-ray negative, ultrasound with inflammatory changes  Status post mechanical fall , slipped in bathroom injury to lower lip, chin.  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  Hyperkalemia.  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  Nail removed by podiatry  PAD PVR infrainguinal disease bilateral ,Seen by IR  No Intervention indicated if right helix wound does not heal we will consider angiogram by interventional radiology.  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 Make sure you  understand how and when to take each.  Commonly known as: TYLENOL  Take 2 tablets (650 mg) by mouth every 6 (six) hours as needed for Fever     * acetaminophen 500 MG tablet  Dose: 500 mg  What changed: Another medication with the same name was added. Make sure you understand how and when to take each.  Commonly known as: TYLENOL  Take 500 mg by mouth every 6 (six) hours as needed for Pain     albuterol sulfate HFA 108 (90 Base) MCG/ACT inhaler  Dose: 2 puff  Commonly known as: PROVENTIL  Inhale 2 puffs into the lungs every 6 (six) hours as needed for Wheezing or Shortness of Breath     albuterol-ipratropium 2.5-0.5(3) mg/3 mL nebulizer  Dose: 3 mL  Commonly known as: DUO-NEB  Take 3 mLs by nebulization every 8 (eight) hours     amLODIPine 10 MG tablet  Dose: 1 tablet  Commonly known as: NORVASC  Take 1 tablet by mouth daily     aspirin 81 MG chewable tablet  Dose: 81 mg  Chew 81 mg by mouth daily     atorvastatin 40 MG tablet  Dose: 40 mg  Commonly known as: LIPITOR  Take 1 tablet (40 mg) by mouth daily     carvedilol 25 MG tablet  Dose: 25 mg  Commonly known as: COREG  Take  1 tablet (25 mg) by mouth every 12 (twelve) hours     dextromethorphan-guaiFENesin 30-600 MG per 12 hr tablet  Dose: 1 tablet  Commonly known as: MUCINEX DM  Take 1 tablet by mouth every 12 (twelve) hours     gabapentin 100 MG capsule  Dose: 100 mg  Commonly known as: NEURONTIN  Take 1 capsule (100 mg) by mouth every 8 (eight) hours     guaiFENesin 100 MG/5ML oral liquid  Dose: 200 mg  Commonly known as: ROBITUSSIN  Take 10 mLs (200 mg) by mouth every 4 (four) hours as needed for Cough or Congestion     insulin glargine 100 UNIT/ML injection  Dose: 40 Units  Commonly known as: LANTUS  Inject 40 Units into the skin nightly     * insulin lispro 100 UNIT/ML injection  Dose: 1-8 Units  Inject 1-8 Units into the skin 3 (three) times daily before meals Medium dose correction insulin. Add to prandial insulin dose if ordered. Give even if NPO.  If BG  greater than 180 mg/dL add the following:  For BG 181 - 200 mg/dL give additional 1 unit  For BG 201 - 250 mg/dL give additional 3 units  For BG 251 - 300 mg/dL give additional 5 units  For BG 301 - 350 mg/dL give additional 7 units  For BG greater than 350 mg/dL give additional 8 units     * insulin lispro 100 UNIT/ML injection  Dose: 1-4 Units  Inject 1-4 Units into the skin nightly Medium dose correction insulin. Give even if NPO.  If BG greater than 180 mg/dL add the following:  For BG 181 - 200 mg/dL give 1 unit  For BG 201 - 250 mg/dL give 2 units  For BG 251 - 300 mg/dL give 3 units  For BG 301 - 350 mg/dL give 4 units  For BG greater than 350 mg/dL give 4 units     * insulin lispro 100 UNIT/ML injection  Dose: 8 Units  Inject 8 Units into the skin 3 (three) times daily before meals     lactobacillus/streptococcus Caps  Dose: 1 capsule  Take 1 capsule by mouth daily     lidocaine 5 %  Dose: 1 patch  Commonly known as: LIDODERM  Place 1 patch onto the skin every 24 hours Remove & Discard patch within 12 hours or as directed by MD     mupirocin 2 % ointment  Commonly known as: BACTROBAN  Apply topically daily USe BID for 2 Weeks     oxyCODONE-acetaminophen 5-325 MG per tablet  Dose: 1 tablet  Commonly known as: PERCOCET  Take 1 tablet by mouth every 4 (four) hours as needed for Pain     pantoprazole 40 MG tablet  Dose: 40 mg  Commonly known as: PROTONIX  Take 40 mg by mouth daily     petrolatum ointment  Apply topically every 12 (twelve) hours     polyethylene glycol 17 g packet  Dose: 17 g  Commonly known as: MIRALAX  Take 17 g by mouth daily as needed (Constipation)     senna-docusate 8.6-50 MG per tablet  Dose: 2 tablet  Commonly known as: PERICOLACE  Take 2 tablets by mouth nightly     * sevelamer 800 MG tablet  Dose: 800 mg  What changed: Another medication with the same name was added. Make sure you understand how and when to take each.  Commonly known as: RENVELA  Take 800 mg by  mouth 2 (two) times daily  with meals     * sevelamer 800 MG tablet  Dose: 800 mg  What changed: You were already taking a medication with the same name, and this prescription was added. Make sure you understand how and when to take each.  Commonly known as: RENVELA  Take 1 tablet (800 mg) by mouth 3 (three) times daily with meals           * This list has 7 medication(s) that are the same as other medications prescribed for you. Read the directions carefully, and ask your doctor or other care provider to review them with you.                STOP taking these medications      insulin aspart 100 UNIT/ML injection  Commonly known as: [place] Hospital Course:   Details of admission and consultations on record.  KEARA PAGLIARULO is a 66 y.o. female history of COPD, type 2 diabetes mellitus, hypertension, hyperlipidemia, COPD, ESRD-DD MWF presented to the emergency room after a fall , patient stated that she had lost her balance she was complaining of left chest pain left breast was swollen she also was complaining of right foot pain right great toe denies change in visual acuity evulsed patient was in shower and slipped no cardiac no neurological prodromal symptoms.  She was admitted to hospital for short period patient was not responding she had metabolic encephalopathy RRT was called patient got 1 chest compression and then she woke up she was found with hypoxia head CT was negative.  Patient was admitted to hospital for further management.  Hospital course:  She has been complaining of chest pain on the left side left breast was swollen tender and had injury from fall, patient was seen by cardiology in consultation given chest pain being reproducible medical management was continued.  Reference to her breast patient was treated with antibiotic for MRSA infection already she was started on Rocephin currently she is on vancomycin until May 27, 2021 for MRSA infection and abdominal wall.  Patient was consulted by  infectious diseases, oncology regarding left breast swelling and possibility of inflammatory breast cancer, ultrasound was obtained which shows changes of inflammation however with progressive dialysis and antibiotic patient's breast swelling is much better.  I spoke to Dr. Foye Clock Breast surgeon   patient will be followed by Dr. Foye Clock  as outpatient for further management.  Rib cage x-ray was negative for fracture.  Patient had acute metabolic encephalopathy-resolved she is back to her baseline.  Her fall was related to slipping in the bathroom-mechanical she injured her lip and chin both places are healing.  She was recently diagnosed with abdominal wall abscess with MRSA at PD catheter site patient is on dialysis and is receiving vancomycin per dialysis center May 27, 2021.  She had hyperkalemia in the setting of underlying end-stage renal disease patient had been receiving dialysis per schedule.  Potassium level is stable.  She had right great toenail avulsion and wound podiatry was consulted, nail was removed she arterial Doppler which shows PAD/PVR infrainguinal disease bilaterally, patient was seen by interventional radiology no intervention is suggested at this point if she does not heal on her right great toe she should get angiogram for further evaluation and management with interventional radiology.  Patient has pulmonary nodules bilaterally , advised  to follow-up with pulmonary medicine.  She has history of COPD, chronic respiratory failure uses oxygen as needed at home.  She is diabetic hypertensive has history of thrombocytopenia likely ITP, has been seen by hematology recommend follow-up with hematology after discharge.  Patient has secondary hyperparathyroidism from renal disease.  Also has obstructive sleep apnea needs to use CPAP .  Patient has generalized debility, was seen by occupational and physical therapy, is is recommended to go to skilled nursing facility patient is agreeable Case management  assisting for discharge planning.  Further hospital course per daily notes.  DCP to SNF per arrangements , d/w Pt and Case manager .  Called husband, message left.  Discharge planning per arrangement.    Laboratory Data     CBC  Recent Labs   Lab 04/30/21  0412 04/29/21  1022 04/28/21  1036 04/27/21  0542   WBC 6.68 8.06 7.28 7.37   Hgb 9.7* 10.8* 11.0* 10.7*   Hematocrit 32.9* 35.9 37.3 35.5   Platelets 90* 107* 112* 106*   MCV 94.8 93.2 96.9* 95.9         CMP  Recent Labs   Lab 04/30/21  0412 04/29/21  1022 04/28/21  1036 04/27/21  0542   Sodium 139 137 137 138   Potassium 4.7 4.5 4.4 4.3   Chloride 103 102 103 104   CO2 28 25 24 24    BUN 40.0* 49.0* 37.0* 54.0*   Creatinine 4.3* 5.4* 4.7* 5.7*   Glucose 264* 152* 234* 189*   Calcium 8.3* 8.3* 8.0* 7.5*         Lipid panel              Lab Results   Component Value Date    TSH 0.97 02/05/2021       Cardiac enzymes                Culture:   Microbiology Results (last 15 days)       Procedure Component Value Units Date/Time    COVID-19 (SARS-CoV-2) only ([facility]) asymptomatic admission [[id number]] Collected: 04/23/21 1352    Order Status: Completed Specimen: Nasopharyngeal Updated: 04/23/21 1429     Purpose of COVID testing Screening     SARS-CoV-2 Specimen Source Nasal Swab     SARS CoV 2 Overall Result Not Detected     Comment: __________________________________________________  -A result of "Detected" indicates POSITIVE for the    presence of SARS CoV-2 RNA  -A result of "Not Detected" indicates NEGATIVE for the    presence of SARS CoV-2 RNA  __________________________________________________________  Test performed using the Roche cobas [facility] SARS-CoV-2 assay. This assay is  only for use under the Food and Drug Administration's Emergency Use  Authorization. This is a real-time RT-PCR assay for the qualitative  detection of SARS-CoV-2 RNA. Viral nucleic acids may persist in vivo,  independent of viability. Detection of viral nucleic acid does not imply  the  presence of infectious virus, or that virus nucleic acid is the cause of  clinical symptoms. Negative results do not preclude SARS-CoV-2 infection and  should not be used as the sole basis for diagnosis, treatment or other  patient management decisions. Negative results must be combined with  clinical observations, patient history, and/or epidemiological information.  Invalid results may be due to inhibiting substances in the specimen and  recollection should occur. Please see Fact Sheets for patients and providers  located:  https://www.benson-chung.com/         Narrative:  o Collect and clearly label specimen type:  o PREFERRED-Upper respiratory specimen: One Nasal Swab in  Transport Media.  o Hand deliver to laboratory ASAP  Indication for testing->Extended care facility admission to  semi private room  Screening            All radiology result for current encounter:  US Breast Left Ltd    Result Date: 04/29/2021   1. Increased skin thickening and edema as well as increased left axillary lymphadenopathy. Given that these findings predate the patient's fall and are worsening, inflammatory breast cancer must be excluded. Recommend breast surgery consultation and skin punch biopsy. When the patient is an outpatient, diagnostic mammogram and follow-up ultrasound should be performed. BI-RADS: 4, suspicious Earney Navy, MD 04/29/2021 2:53 PM    XR Ribs Left W PA Chest    Result Date: 04/27/2021  1. No plain film evidence of fracture or osseous destruction. 2. New interstitial nodular infiltrates in the mid and lower lung field which could represent infection or atypical congestive heart failure. No pleural effusion Jennell Corner, MD 04/27/2021 9:11 AM     Echo Results       None              Physical Exam:   BP 142/63   Pulse 62   Temp 98.2 F (36.8 C) (Oral)   Resp (!) 26   Ht 1.575 m (5\' 2" )   Wt 101.2 kg (223 lb 1.7 oz)   LMP  (LMP Unknown) Comment: post menopausal  SpO2 97%   BMI 40.81 kg/m      General appearance - alert, and in no distress  Obese BMI 42  HEENT: Normocephalic,atraumatic, pupil equal  Neck - supple  Chest - clear to auscultation  Heart - normal rate and regular rhythm  Abdomen - bowel sounds normal, soft, non distended  Extremities - +ve Left Breast edema , right great toe nail Off   Neurological - Alert , No Gross Focal deficits     Discharge condition:   Stable     Discharge  Diet :   Cardiac     Discharge Instructions:   Follow up:     Philipp Ovens, MD  [address]  [place] [address]  [phone]    Schedule an appointment as soon as possible for a visit      [address], [address]  [place] [address]  [phone]          Cindi Carbon, [place]  [place]  [place] [address]  [phone]    Follow up      Sena Slate, MD  [address]   [place] [address]  [phone]    Follow up  COPD , Lung Nodule    Sherri Rad, [place]  [facility]  [place] [address]  [phone]    Follow up  for PAD        Barbaraann Faster, MD  05/03/2021  10:10 AM  Time spent 45  minutes

## 2021-05-04 LAB — CBC
Absolute NRBC: 0 x10 3/uL (ref 0.00–0.00)
Hematocrit: 33.1 % — ABNORMAL LOW (ref 34.7–43.7)
Hgb: 9.7 g/dL — ABNORMAL LOW (ref 11.4–14.8)
MCH: 28.2 pg (ref 25.1–33.5)
MCHC: 29.3 g/dL — ABNORMAL LOW (ref 31.5–35.8)
MCV: 96.2 fL — ABNORMAL HIGH (ref 78.0–96.0)
MPV: 12.2 fL (ref 8.9–12.5)
Nucleated RBC: 0 /100{WBCs} (ref 0.0–0.0)
Platelets: 87 x10 3/uL — ABNORMAL LOW (ref 142–346)
RBC: 3.44 x10 6/uL — ABNORMAL LOW (ref 3.90–5.10)
RDW: 15 % (ref 11–15)
WBC: 5.63 x10 3/uL (ref 3.10–9.50)

## 2021-05-04 LAB — BASIC METABOLIC PANEL
Anion Gap: 10 (ref 5.0–15.0)
BUN: 46 mg/dL — ABNORMAL HIGH (ref 7.0–21.0)
CO2: 27 mEq/L (ref 17–29)
Calcium: 8.9 mg/dL (ref 8.5–10.5)
Chloride: 104 mEq/L (ref 99–111)
Creatinine: 4.5 mg/dL — ABNORMAL HIGH (ref 0.4–1.0)
Glucose: 173 mg/dL — ABNORMAL HIGH (ref 70–100)
Potassium: 5.3 mEq/L (ref 3.5–5.3)
Sodium: 141 mEq/L (ref 135–145)

## 2021-05-04 LAB — VANCOMYCIN, RANDOM: Vancomycin Random: 26 ug/mL

## 2021-05-04 LAB — GLUCOSE WHOLE BLOOD - POCT
Whole Blood Glucose POCT: 103 mg/dL — ABNORMAL HIGH (ref 70–100)
Whole Blood Glucose POCT: 190 mg/dL — ABNORMAL HIGH (ref 70–100)
Whole Blood Glucose POCT: 244 mg/dL — ABNORMAL HIGH (ref 70–100)

## 2021-05-04 LAB — GFR: EGFR: 11.8

## 2021-05-04 LAB — PROBNP: NT-proBNP: 18973 pg/mL — ABNORMAL HIGH (ref 0–125)

## 2021-05-04 LAB — WHOLE BLOOD GLUCOSE POCT: Whole Blood Glucose POCT: 140 mg/dL — ABNORMAL HIGH (ref 70–100)

## 2021-05-04 MED ORDER — MICONAZOLE NITRATE 2 % EX CREAM WITH ZINC OXIDE
TOPICAL_CREAM | Freq: Two times a day (BID) | CUTANEOUS | Status: DC
Start: 2021-05-04 — End: 2021-05-06
  Filled 2021-05-04: qty 142

## 2021-05-04 NOTE — Progress Notes (Signed)
PROGRESS NOTE     Date Time: 05/04/21 7:58 AM  Patient Name: [name],[name]      ASSESSMENT:   Peau'D orange type skin changes of the left breast without overt masses, erythema-  likely dependent edema -concern for malignancy on USG- outpatient biopsy recommended- improving post dialysis  Acute on Chronic Thrombocytopenia, likely ITP- post infectious exacerbation- slowly dropping  Chronic Normocytic Anemia, due to CKD - stable  Subcentimeter pulmonary nodule- pulmonology following  Acute encephalopathy, status post RRT( chest compression x 1 ) , found with hypoxia, head CT negative.- at baseline  Status post mechanical fall , slipped in bathroom injury to lower lip, chin.  Injury to left chest wall, edema, X Ray Neg for Fx, right great toe from fall  Chest pain, likely Musculoskeletal  Recent abdominal wall abscess MRSA at PD catheter site on Vanco at dialysis.  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  Continue pain management  DVT ppx  Continue home meds for comorbid conditions    Subjective:     Chest pain is improving with the lidocaine patch  Denies sob  No other new complaints   No fever or chills      Medications:      Scheduled Meds: PRN Meds:    amLODIPine, 10 mg, Oral, Daily  atorvastatin, 40 mg, Oral,  Daily  carvedilol, 25 mg, Oral, Q12H SCH  dextrose, 50 g, Intravenous, Once  gabapentin, 100 mg, Oral, Q8H SCH  heparin (porcine), 5,000 Units, Subcutaneous, Q12H SCH  insulin glargine, 40 Units, Subcutaneous, QHS  insulin lispro, 1-4 Units, Subcutaneous, QHS  insulin lispro, 1-8 Units, Subcutaneous, TID AC  insulin lispro, 8 Units, Subcutaneous, TID AC  lidocaine, 1 patch, Transdermal, Q24H  petrolatum, , Topical, Q12H  senna-docusate, 2 tablet, Oral, QHS  sevelamer, 800 mg, Oral, TID MEALS  vancomycin, , Intravenous, See Admin Instructions         acetaminophen, 650 mg, Q6H PRN   Or  acetaminophen, 650 mg, Q6H PRN  albuterol-ipratropium, 3 mL, Q6H PRN  dextrose, 15 g of glucose, PRN   Or  dextrose, 12.5 g, PRN   Or  dextrose, 12.5 g, PRN   Or  glucagon (rDNA), 1 mg, PRN  guaiFENesin, 200 mg, Q4H PRN  hydrALAZINE, 10 mg, Q6H PRN  lactulose, 10 g, Daily PRN  magnesium sulfate, 1 g, PRN  melatonin, 3 mg, QHS PRN  naloxone, 0.2 mg, PRN  ondansetron, 4 mg, Q6H PRN   Or  ondansetron, 4 mg, Q6H PRN  oxyCODONE-acetaminophen, 1 tablet, Q4H PRN  polyethylene glycol, 17 g, Daily PRN          I personally reviewed all of the medications    Review of Systems:   Per narrative, 2 system review was done.  Physical Exam:   Visit Vitals  BP 142/63   Pulse 65   Temp 98.3 F (36.8 C) (Axillary)   Resp 16   Ht 1.575 m (5\' 2" )   Wt 101.2 kg (223 lb 1.7 oz)   LMP  (LMP Unknown)   SpO2 91%   BMI 40.81 kg/m         Intake/Output Summary (Last 24 hours) at 05/04/2021 0758  Last data filed at 05/03/2021 1844  Gross per 24 hour   Intake 118 ml   Output 3000 ml   Net -2882 ml            Physical Exam:      General: no acute distress.  Skin: no rashes or lesions.  HENT: no scleral icterus, no lymphadenopathy.  Lungs: Bilateral breath sounds anterior chest,   Bilateral breast examination was done and left breast appears tender to touch with some skin edema and pitting without any overt erythema  or palpable lumps.  Right breast exam was  normal.  Heart: normal rate, regular rhythm, no murmur.  Abdomen: soft, non-tender, no masses.  Musculoskeletal: Right great toenail removed.   Neurologic: alert, and oriented X3, CN normal speech, no focal findings or movement disorder noted.            Labs:     Recent Labs   Lab 04/30/21  0412 04/29/21  1022 04/28/21  1036   Glucose 264* 152* 234*   BUN 40.0* 49.0* 37.0*   Creatinine 4.3* 5.4* 4.7*   Calcium 8.3* 8.3* 8.0*   Sodium 139 137 137   Potassium 4.7 4.5 4.4   Chloride 103 102 103   CO2 28 25 24        Recent Labs   Lab 04/30/21  0412 04/29/21  1022 04/28/21  1036   WBC 6.68 8.06 7.28   Hgb 9.7* 10.8* 11.0*   Hematocrit 32.9* 35.9 37.3   MCV 94.8 93.2 96.9*   MCH 28.0 28.1 28.6   MCHC 29.5* 30.1* 29.5*   Platelets 90* 107* 112*         No results for input(s): PTT, PT, INR in the last 72 hours.      Microbiology:  Microbiology Results (last 15 days)       Procedure Component Value Units Date/Time    COVID-19 (SARS-CoV-2) only ([facility]) asymptomatic admission [[id number]] Collected: 04/23/21 1352    Order Status: Completed Specimen: Nasopharyngeal Updated: 04/23/21 1429     Purpose of COVID testing Screening     SARS-CoV-2 Specimen Source Nasal Swab     SARS CoV 2 Overall Result Not Detected     Comment: __________________________________________________  -A result of "Detected" indicates POSITIVE for the    presence of SARS CoV-2 RNA  -A result of "Not Detected" indicates NEGATIVE for the    presence of SARS CoV-2 RNA  __________________________________________________________  Test performed using the Roche cobas [facility]iat SARS-CoV-2 assay. This assay is  only for use under the Food and Drug Administration's Emergency Use  Authorization. This is a real-time RT-PCR assay for the qualitative  detection of SARS-CoV-2 RNA. Viral nucleic acids may persist in vivo,  independent of viability. Detection of viral nucleic acid does not imply the  presence of infectious virus, or that virus nucleic acid is the cause  of  clinical symptoms. Negative results do not preclude SARS-CoV-2 infection and  should not be used as the sole basis for diagnosis, treatment or other  patient management decisions. Negative results  must be combined with  clinical observations, patient history, and/or epidemiological information.  Invalid results may be due to inhibiting substances in the specimen and  recollection should occur. Please see Fact Sheets for patients and providers  located:  https://www.benson-chung.com/         Narrative:      o Collect and clearly label specimen type:  o PREFERRED-Upper respiratory specimen: One Nasal Swab in  Transport Media.  o Hand deliver to laboratory ASAP  Indication for testing->Extended care facility admission to  semi private room  Screening                Signed by: Cindi Carbon, MD

## 2021-05-04 NOTE — Plan of Care (Addendum)
Pt AOX4, on 2L NC with SpO2 in the 90's. Pt had hemodialysis. 1XPRN oxycodone given for pain in chest region (from the fall). 1X soft brown BM. Wound care provided.   Pt sat in the chair for 4 hours.     Problem: Fluid and Electrolyte Imbalance/ Endocrine  Goal: Fluid and electrolyte balance are achieved/maintained  Outcome: Progressing  Flowsheets (Taken 05/04/2021 1031)  Fluid and electrolyte balance are achieved/maintained:   Monitor intake and output every shift   Monitor/assess lab values and report abnormal values   Assess and reassess fluid and electrolyte status   Observe for cardiac arrhythmias   Monitor for muscle weakness     Problem: Diabetes: Glucose Imbalance  Goal: Blood glucose stable at established goal  Outcome: Progressing  Flowsheets (Taken 05/04/2021 1031)  Blood glucose stable at established goal:   Assess for hypoglycemia /hyperglycemia   Monitor/assess vital signs     Problem: Renal Instability  Goal: Fluid and electrolyte balance are achieved/maintained  Outcome: Progressing  Flowsheets (Taken 05/04/2021 1031)  Fluid and electrolyte balance are achieved/maintained:   Monitor/assess lab values and report abnormal values   Assess and reassess fluid and electrolyte status     Problem: Compromised Tissue integrity  Goal: Damaged tissue is healing and protected  Outcome: Progressing  Flowsheets (Taken 05/04/2021 1031)  Damaged tissue is healing and protected:   Increase activity as tolerated/progressive mobility   Relieve pressure to bony prominences for patients at moderate and high risk   Keep intact skin clean and dry   Monitor external devices/tubes for correct placement to prevent pressure, friction and shearing   Encourage use of lotion/moisturizer on skin   Monitor patient's hygiene practices

## 2021-05-04 NOTE — Plan of Care (Signed)
Neuro: A&OX4, follows command   Resp: 2L NC, BiPap at night  CV: NSR on tele  GI: Consistent carb/Heart healthy diet, 1.2L fluid restriction  GU: ext cath  Activity: active all extremities   Shift Events:  No significant event throughout the night, all safety measures in place      Problem: Moderate/High Fall Risk Score >5  Goal: Patient will remain free of falls  Outcome: Progressing  Flowsheets (Taken 05/04/2021 2000)  High (Greater than 13):   HIGH-Initiate use of floor mats as appropriate   HIGH-Consider use of low bed     Problem: Diabetes: Glucose Imbalance  Goal: Blood glucose stable at established goal  Outcome: Progressing  Flowsheets (Taken 05/04/2021 2308)  Blood glucose stable at established goal:   Monitor lab values   Follow fluid restrictions/IV/PO parameters   Monitor/assess vital signs   Assess for hypoglycemia /hyperglycemia   Coordinate medication administration with meals, as indicated   Ensure appropriate diet and assess tolerance     Problem: Potential for Aspiration  Goal: Risk of aspiration will be minimized  Outcome: Progressing  Flowsheets (Taken 05/04/2021 2308)  Risk of aspiration will be minimized:   Dysphagia screen: Keep patient NPO if patient fails screening   Assess and monitor ability to swallow   Monitor/assess for signs of aspiration (tachypnea, cough, wheezing, clearing throat, hoarseness after eating, decrease in SaO2)   Place patient up in chair to eat, if possible     Problem: Safety  Goal: Patient will be free from injury during hospitalization  Outcome: Progressing  Flowsheets (Taken 05/04/2021 2308)  Patient will be free from injury during hospitalization:   Assess patient's risk for falls and implement fall prevention plan of care per policy   Provide and maintain safe environment   Ensure appropriate safety devices are available at the bedside   Include patient/ family/ care giver in decisions related to safety     Problem: Pain  Goal: Pain at adequate level as identified by  patient  Outcome: Progressing  Flowsheets (Taken 05/04/2021 2308)  Pain at adequate level as identified by patient:   Identify patient comfort function goal   Assess for risk of opioid induced respiratory depression, including snoring/sleep apnea. Alert healthcare team of risk factors identified.   Assess pain on admission, during daily assessment and/or before any "as needed" intervention(s)   Evaluate if patient comfort function goal is met

## 2021-05-04 NOTE — Consults (Addendum)
Pharmacy Vancomycin Dosing Consult    Day 11  Vancomycin Indication: MRSA Abdominal Wall Infection  Vancomycin Monitoring Goal: Pre-HD 15-25 mcg/mL  Current Regimen: dose by pre HD levels      Pertinent Cultures:   Wcx (+) heavy MRSA     Serum creatinine: 4.3 mg/dL (H) 04/30/21 0412  Estimated creatinine clearance: 14.3 mL/min (A)  Baseline Scr ESRD on HD MWF, and PRN here  Nephrotoxic agents/conditions: Advanced Age (>65Y)    Measured Vancomycin Level:   Recent Labs   Lab 05/04/21  0401 05/03/21  0446 05/02/21  1531 04/29/21  1022 04/28/21  1036   Vancomycin Random 26.0 23.9 17.6 25.2 21.9       Assessment/Recommendations:   - Pre HD level 26 mcg/ml supraT  - Hold dose today      Thank you for this consult. Pharmacy will continue to follow this patient's progress with you until consult and/or vancomycin is discontinued.    Joesph Fillers, [place], Pharm.D.  Phone: [phone]

## 2021-05-04 NOTE — Progress Notes (Signed)
Patient alert, oriented x 3. Denies any pain. Vital signs within normal range. For routine HD today with UF as tolerated. Treatment delayed, patient needs to be clean up. Will monitor closely on dialysis.     05/04/21 1000   Bedside Nurse Communication   Name of bedside RN - pre dialysis Nyinyi Gwa   Treatment Initiation- With Dialysis Precautions   Time Out/Safety Check Completed Yes   Consent for HD signed for this hospitalization (Date) 02/04/21   Consent for HD signed for this hospitalization (Time) 1450   Preferred language No   Blood Consent Verified N/A   Dialysis Precautions All Connections Secured;Saline Line Double Clamped;Arterial Parameters Set;Venous Parameters Set;Air Foam Detecctor Engaged   Dialysis Treatment Type Bedside;Routine   Special Considerations contact isolation   Is patient diabetic? Yes   RO/Hemodialysis Architectural technologist   Is Total Chlorine less than 0.1 ppm? Yes   Orignial Total Chlorine Testing Time 0900   RO/Hemodialysis Facilities manager Number 1    Machine Serial Number [id number]   RO # 25   RO Serial # [id number]   Water Hardness NA   pH 7   Pressure Test Verified Yes   Alarms Verified Passed   Machine Temperature 97.7 F (36.5 C)   Alarms Verified Yes   Na+ mEq (Machine) 138 mEq   Bicarb mEq (Machine) 35 mEq   Hemodialysis Conductivity (Machine) 13.8   Hemodialysis Conductivity (Meter) 13.8   Dialyzer Lot Number [id number]   Tubing Lot Number [id number]   RO Machine Log Completed Yes   Hepatitis Status   HBsAg (Antigen) Result Negative   HBsAg Date Drawn 04/13/21   HBsAg Repeat Draw Due Date 05/11/21   HBsAb (Antibody) Result Non-Reactive   Hepatitis C Antibody Non-Reactive   Dialysis Weight   Pre-Treatment Weight (Kg) 101.2   Scale Type ICU Bed Scale   Vitals   Temp 98.3 F (36.8 C)   Heart Rate 66   Resp Rate (!) 34   BP (!) 119/91   SpO2 100 %   O2 Device None (Room air)   Assessment   Mental Status Alert;Cooperative   Cardiac (WDL) WDL   Cardiac Regularity Regular    Cardiac Symptoms None   Cardiac Rhythm Normal Sinus Rhythm   Respiratory  WDL   Respiratory Pattern Regular   Bilateral Breath Sounds Clear;Diminished   Cough Spontaneous   Edema  WDL   Generalized Edema Non Pitting Edema   RLE Edema +1   LLE Edema +1   General Skin Color Appropriate for ethnicity   Skin Condition/Temp Warm;Dry   Gastrointestinal (WDL) X   Abdomen Inspection Soft;Nondistended   GI Symptoms None   Mobility Bed;Ambulatory with Assistance   Graft/Fistula Left   No placement date or time found.   Access Type: Arteriovenous Fistula  Orientation: Left  Graft/Fistula Location: Upper Arm   Fistula/ Graft Assessment Abnormalities WDL   Needle Size 15 Gauge   Pain Assessment   Charting Type Assessment   Pain Scale Used Numeric Scale (0-10)   Numeric Pain Scale   Pain Score 0   POSS Score 1   Hemodialysis Comments   Pre-Hemodialysis Comments time out done   Hemodialysis History Information   Outpatient Dialysis Unit [facility]   Chronic Dialysis Patient Yes   Outpatient Nephrologist Dr Drema Pry

## 2021-05-04 NOTE — Progress Notes (Signed)
CM called the admissions liaison Kristen to check on auth status and pre-auth dept has not gotten back with her. CM will continue to follow.    [facility]  [phone]

## 2021-05-04 NOTE — Progress Notes (Signed)
Completed 4 hours of HD. 4.6 L UF tolerated well. Report given to [facility] RN.     05/04/21 1415   Treatment Summary   Time Off Machine 1410   Duration of Treatment (Hours) 4   Treatment Type 1:1   Dialyzer Clearance Moderately streaked   Fluid Volume Off (mL) 5000   Prime Volume (mL) 200   Rinseback Volume (mL) 200   Fluid Given: Normal Saline (mL) 0   Fluid Given: PRBC  0 mL   Fluid Given: Albumin (mL) 0   Fluid Given: Other (mL) 0   Total Fluid Given 400   Hemodialysis Net Fluid Removed 4600   Post Treatment Assessment   Post-Treatment Weight (Kg) -4.6   Patient Response to Treatment tolerated well   Graft/Fistula Left   No placement date or time found.   Access Type: Arteriovenous Fistula  Orientation: Left  Graft/Fistula Location: Upper Arm   Fistula/ Graft Assessment Abnormalities WDL   Needle Size 15 Gauge   Cannulation Sites held Arterial (min) 10   Cannulation Sites held Venous (min) 10   Hemostasis Achieved Yes   Vitals   Temp 98.3 F (36.8 C)   Heart Rate (!) 59   Resp Rate 19   BP 131/60   SpO2 98 %   O2 Device None (Room air)   Assessment   Mental Status Alert;Oriented;Cooperative   Cardiac (WDL) WDL   Cardiac Regularity Regular   Cardiac Symptoms None   Cardiac Rhythm Normal Sinus Rhythm   Respiratory  WDL   Respiratory Pattern Regular   Bilateral Breath Sounds Clear;Diminished   Edema  WDL   Generalized Edema +1   RLE Edema +1   LLE Edema +1   General Skin Color Appropriate for ethnicity   Skin Condition/Temp Warm;Dry   Gastrointestinal (WDL) X   Abdomen Inspection Soft;Nondistended   GI Symptoms None   Mobility Bed   Pain Assessment   Charting Type Assessment   Pain Scale Used Numeric Scale (0-10)   Numeric Pain Scale   Pain Score 0   POSS Score 1   Education   Person taught Patient   Knowledge basis Substantial   Topics taught Access care;Fluid Management;Procedure;K+   Teaching Tools Explain   Reponse Verbalizes Understanding   Bedside Nurse Communication   Name of bedside RN - post dialysis Nyinyi  Gwa

## 2021-05-04 NOTE — Plan of Care (Signed)
Problem: Safety  Goal: Patient will be free from injury during hospitalization  Outcome: Progressing     Problem: Patient Receiving Advanced Renal Therapies  Goal: Therapy access site remains intact  Outcome: Progressing     Problem: Renal Instability  Goal: Fluid and electrolyte balance are achieved/maintained  Outcome: Progressing     Problem: Fluid and Electrolyte Imbalance/ Endocrine  Goal: Fluid and electrolyte balance are achieved/maintained  Outcome: Progressing

## 2021-05-04 NOTE — Plan of Care (Signed)
Problem: Nutrition  Goal: Nutritional intake is adequate  Outcome: Progressing  Flowsheets (Taken 05/04/2021 1040)  Nutritional intake is adequate:   Allow adequate time for meals   Assist patient with meals/food selection   Monitor daily weights     Problem: Renal Instability  Goal: Nutritional intake is adequate  Outcome: Progressing  Flowsheets (Taken 05/04/2021 1040)  Nutritional intake is adequate:   Allow adequate time for meals   Assist patient with meals/food selection   Monitor daily weights  Goal: Fluid and electrolyte balance are achieved/maintained  Outcome: Progressing  Flowsheets (Taken 05/04/2021 1040)  Fluid and electrolyte balance are achieved/maintained:   Assess and reassess fluid and electrolyte status   Monitor/assess lab values and report abnormal values  Goal: Free from infection  Outcome: Progressing  Flowsheets (Taken 05/04/2021 1040)  Free from infection:   Assess need for indwelling catheter every shift and discuss with LIP   Monitor/assess for signs and symptoms of infection     Problem: Patient Receiving Advanced Renal Therapies  Goal: Therapy access site remains intact  Outcome: Progressing  Flowsheets (Taken 05/04/2021 1040)  Therapy access site remains intact:   Assess therapy access site   Change therapy access site dressing as needed

## 2021-05-04 NOTE — Progress Notes (Signed)
[facility] PROGRESS NOTE  [id number]    Date Time: 05/04/21 1:57 PM  Patient Name: [name]  Attending Physician: Barbaraann Faster, MD    CC: follow-up ESRD  HD note     Assessment:     ESRD on HD MWF at [facility] - followed by Dr. 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 HD today  2. Keep on MWF schedule   3. Fluid restriction 1.5L/day   4. BNP elevated, Check 2D Echo   5. Awaiting discharge to SNF [facility] rehab)      Case discussed with: pt, HD RN    Gaylyn Lambert, MD  [facility]  [id number] (office)    Subjective:     UF 3L yesterday, now on RA    Seen on HD  Dialysis Treatment Data:  Blood flow rate: 400 mL/min  Dialysate flow rate: 700 mL/min  Ultrafiltration rate: 1250 mL/hr    Dialysate potassium: 2 mEq/L  Dialysate calcium: 2.5 mEq/L    Arterial pressure: -228 mmHg  Venous pressure: 271mmHg     Review of Systems:     No cp  No sob      Physical Exam:     Vitals:    05/04/21 1300 05/04/21 1315 05/04/21 1330 05/04/21 1345   BP: 118/58 113/55 117/57 115/57   Pulse: (!) 57 (!) 57 (!) 57 (!) 57   Resp: 14 15 19 15    Temp:       TempSrc:       SpO2: 99% 96% 100% 97%   Weight:       Height:         Intake and Output Summary (Last 24 hours) at Date Time    Intake/Output Summary (Last 24 hours) at 05/04/2021 1357  Last data filed at 05/03/2021 1844  Gross per 24 hour   Intake --   Output 3000 ml   Net -3000 ml       General: awake, alert, oriented x 3, no acute distress  Cardiovascular: regular rate and rhythm, no murmurs, rubs or gallops  Lungs: clear to auscultation bilaterally, without wheezing, rhonchi, or  rales  Abdomen: soft, non-tender, non-distended, normoactive bowel sounds  Extremities: +LE edema    Meds:      Scheduled Meds: PRN Meds:    amLODIPine, 10 mg, Oral, Daily  atorvastatin, 40 mg, Oral, Daily  carvedilol, 25 mg, Oral, Q12H SCH  dextrose, 50 g, Intravenous, Once  gabapentin, 100 mg, Oral, Q8H SCH  heparin (porcine), 5,000 Units, Subcutaneous, Q12H SCH  insulin glargine, 40 Units, Subcutaneous, QHS  insulin lispro, 1-4 Units, Subcutaneous, QHS  insulin lispro, 1-8 Units, Subcutaneous, TID AC  insulin lispro, 8 Units, Subcutaneous, TID AC  lidocaine, 1 patch, Transdermal, Q24H  miconazole 2 % with zinc oxide, , Topical, BID  petrolatum, , Topical, Q12H  senna-docusate, 2 tablet, Oral, QHS  sevelamer, 800 mg, Oral, TID MEALS  vancomycin, , Intravenous, See Admin Instructions          Continuous Infusions:   acetaminophen, 650 mg, Q6H PRN   Or  acetaminophen, 650 mg, Q6H PRN  albuterol-ipratropium, 3 mL, Q6H PRN  dextrose, 15 g of glucose, PRN   Or  dextrose, 12.5 g, PRN   Or  dextrose, 12.5 g, PRN   Or  glucagon (rDNA), 1 mg, PRN  guaiFENesin, 200 mg, Q4H PRN  hydrALAZINE, 10 mg, Q6H PRN  lactulose, 10 g, Daily PRN  magnesium sulfate, 1 g, PRN  melatonin, 3 mg, QHS PRN  naloxone, 0.2 mg, PRN  ondansetron, 4 mg, Q6H PRN   Or  ondansetron, 4 mg, Q6H PRN  oxyCODONE-acetaminophen, 1 tablet, Q4H PRN  polyethylene glycol, 17 g, Daily PRN          Labs:     Recent Labs   Lab 05/04/21  1001 04/30/21  0412 04/29/21  1022   WBC 5.63 6.68 8.06   Hgb 9.7* 9.7* 10.8*   Hematocrit 33.1* 32.9* 35.9   Platelets 87* 90* 107*       Recent Labs   Lab 05/04/21  1001 04/30/21  0412 04/29/21  1022   Sodium 141 139 137   Potassium 5.3 4.7 4.5   Chloride 104 103 102   CO2 27 28 25    BUN 46.0* 40.0* 49.0*   Creatinine 4.5* 4.3* 5.4*   Calcium 8.9 8.3* 8.3*   Glucose 173* 264* 152*   EGFR 11.8 12.5 9.6             Invalid input(s): LEUKOCYTESUR      Imaging personally reviewed, including: No results found.    Signed by: Gaylyn Lambert, MD

## 2021-05-04 NOTE — Progress Notes (Signed)
PROGRESS NOTE    Date Time: 05/04/21 6:38 AM  Patient Name: [name], [name]  Patient status: [place] [facility] Day: 0      Assessment:   Reproducible chest pain , left breast swelling status post chest compression x1.  Acute metabolic encephalopathy , s/p  RRT( chest compression x 1 ) , found with hypoxia, head CT negative.  Left breast swelling rib cage x-ray negative, ultrasound with inflammatory changes  Status post mechanical fall , slipped in bathroom injury to lower lip, chin.  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  Hyperkalemia.  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 Better with  Lidoderm   Out patient .Dr.Karzai   Vancomycin until 05/27/2021 (MRSA infection)  On Lantus, sliding scale coverage  On amlodipine Coreg, monitor blood pressure.  Off rocephin nail removed , Vanco with dialysis till 05/27/2021  Dialysis per nephrology / follow echo   Continue statin.  Monitor H&H and platelet count.  Does Not Use CPAP   Fall precaution, OT PT  DVT prophylaxis  DCP  - SNF in progress  Labs and data reviewed.  Discussed with patient and nurse  Discussed with Dr.Ashraf   Subjective:   Patient seen and examined :  Chest pain better no overnight events   Review of Systems:   General ROS:   No fever chills  chest pain better ,no palpitation  Intermittent cough, reproducible chest pain.  No nausea vomiting abdominal pain.  Generalized weakness  Medications:     Current  Facility-Administered Medications   Medication Dose Route Frequency    amLODIPine  10 mg Oral Daily    atorvastatin  40 mg Oral Daily    carvedilol  25 mg Oral Q12H SCH    dextrose  50 g Intravenous Once    gabapentin  100 mg Oral Q8H [place]    heparin (porcine)  5,000 Units Subcutaneous Q12H [place]    insulin glargine  40 Units Subcutaneous QHS    insulin lispro  1-4 Units Subcutaneous QHS    insulin lispro  1-8 Units Subcutaneous TID AC    insulin lispro  8 Units Subcutaneous TID AC    lidocaine  1 patch Transdermal Q24H    petrolatum   Topical Q12H    senna-docusate  2 tablet Oral QHS    sevelamer  800 mg Oral TID MEALS    vancomycin   Intravenous See Admin Instructions       acetaminophen **OR** acetaminophen, albuterol-ipratropium, dextrose **OR** dextrose **OR** dextrose **OR** glucagon (rDNA), guaiFENesin, hydrALAZINE, lactulose, magnesium sulfate, melatonin, naloxone, ondansetron **OR** ondansetron, oxyCODONE-acetaminophen, polyethylene glycol    Physical Exam:   BP 151/69   Pulse 64   Temp 98 F (36.7 C) (Axillary)   Resp 18   Ht 1.575 m (5\' 2" )   Wt 101.2 kg (223 lb 1.7 oz)   LMP  (LMP Unknown) Comment: post menopausal  SpO2 100%   BMI 40.81  kg/m     Intake and Output Summary (Last 24 hours) at Date Time    Intake/Output Summary (Last 24 hours) at 05/04/2021 5597  Last data filed at 05/03/2021 1844  Gross per 24 hour   Intake 118 ml   Output 3000 ml   Net -2882 ml         General: AAO x 3   Neck: Supple no JVD  ENT: PERRLA, EOMI  Lungs : Coarse breath sounds no wheezing no crackles  Heart: Regular rate and rhythm no gallop  Abdomen: Soft nontender bowel sounds positive  Breast, left breast swelling, edema improving.  Extremities: Warm moist  Neuro: Grossly intact  GU: No CVA tenderness  Hemodialysis dependent  Labs:     CBC w/Diff CMP   Recent Labs   Lab 04/30/21  0412 04/29/21  1022 04/28/21  1036   WBC 6.68 8.06 7.28   Hgb 9.7* 10.8* 11.0*   Hematocrit 32.9* 35.9 37.3   Platelets 90* 107* 112*   MCV  94.8 93.2 96.9*         PT/INR         Recent Labs   Lab 04/30/21  0412 04/29/21  1022 04/28/21  1036   Sodium 139 137 137   Potassium 4.7 4.5 4.4   Chloride 103 102 103   CO2 28 25 24    BUN 40.0* 49.0* 37.0*   Creatinine 4.3* 5.4* 4.7*   Glucose 264* 152* 234*   Calcium 8.3* 8.3* 8.0*        Glucose POCT   Recent Labs   Lab 04/30/21  0412 04/29/21  1022 04/28/21  1036   Glucose 264* 152* 234*                  Rads:   No results found.      Barbaraann Faster, MD  05/04/2021    *This note was generated by the Epic EMR system/ Dragon speech recognition and may contain inherent errors or omissions not intended by the user. Grammatical errors, random word insertions, deletions, pronoun errors and incomplete sentences are occasional consequences of this technology due to software limitations. Not all errors are caught or corrected. If there are questions or concerns about the content of this note or information contained within the body of this dictation they should be addressed directly with the author for clarification

## 2021-05-04 NOTE — Progress Notes (Signed)
PULMONARY PROGRESS NOTE                                                                                                              [id number]    Date Time: 05/04/21 6:44 PM  Patient Name: [name], [name] 66 y.o. female admitted with Altered mental status  Admit Date: 04/23/2021    Patient status: Observation  Hospital Day: 0           Assessment:   Musculoskeletal chest pain  Subcentimeter pulmonary nodules  Obstructive sleep apnea  End-stage renal disease  Swelling of the left breast suspicious for malignancy  Hypertension  Hyperlipidemia  Plan:   Consulted breast surgeon outpatient biopsy recommended  Dialysis as per nephrology  Nocturnal CPAP  Glycemic control  Follow pulmonary nodule outpatient, repeat CT scan in 3 to 6 months  Off antibiotics  DVT prophylaxis  Follow-up with pulmonary for pulmonary nodules  Subjective:     Patient seen and examined does not appear to be in any distress.  Chest pain seems to be improving with the lidocaine patch      Medications:     Current Facility-Administered Medications   Medication Dose Route Frequency    amLODIPine  10 mg Oral Daily    atorvastatin  40 mg Oral Daily    carvedilol  25 mg Oral Q12H SCH    dextrose  50 g Intravenous Once    gabapentin  100 mg Oral Q8H [place]    heparin (porcine)  5,000 Units Subcutaneous Q12H [place]    insulin glargine  40 Units Subcutaneous QHS    insulin lispro  1-4 Units Subcutaneous QHS    insulin lispro  1-8 Units Subcutaneous TID AC    insulin lispro  8 Units Subcutaneous TID AC    lidocaine  1 patch Transdermal Q24H    miconazole 2 % with zinc oxide   Topical BID    petrolatum   Topical Q12H    senna-docusate  2 tablet Oral QHS    sevelamer  800 mg Oral TID MEALS    vancomycin   Intravenous See Admin Instructions       Review of Systems:        General ROS: negative for - chills, fever or night sweats  Ophthalmic ROS: negative for - double vision, excessive tearing, itchy eyes or photophobia  ENT ROS: negative for - headaches, nasal  congestion, sore throat or vertigo  Respiratory ROS: Positive for musculoskeletal chest pain improving with lidocaine patch  cardiovascular ROS: negative for - chest pain, edema, orthopnea, rapid heart rate  Gastrointestinal ROS: negative for - abdominal pain, blood in stools, constipation, diarrhea, heartburn or nausea/vomiting  Musculoskeletal ROS: negative for - muscle pain  Neurological ROS: negative for - confusion, dizziness, headaches, speech problems, visual changes or weakness  Dermatological ROS: negative for dry skin, pruritus and rash      Physical Exam:     Vitals:    05/04/21 1548   BP: 118/58   Pulse: 66   Resp: 16  Temp: 97.8 F (36.6 C)   SpO2: 95%         Intake/Output Summary (Last 24 hours) at 05/04/2021 1844  Last data filed at 05/04/2021 1500  Gross per 24 hour   Intake 1080 ml   Output 4600 ml   Net -3520 ml            General appearance - alert,  and in no distress  Mental status - alert, oriented to person, place, and time  Eyes - pupils equal and reactive, extraocular eye movements intact  Ears - no external ear lesions seen  Nose - no nasal discharge   Mouth - clear oral mucosa, moist   Neck - supple, no significant adenopathy  Chest -clear to auscultation.  Left breast swelling appears to be improving  Heart - normal rate, regular rhythm, normal S1, S2, no murmurs, rubs, clicks or gallops  Abdomen - soft, nontender, nondistended, no masses or organomegaly  Neurological - alert, oriented, normal speech, no focal findings or movement disorder noted  Musculoskeletal - no joint swelling or tenderness  Extremities -  no pedal edema, no clubbing or cyanosis  Skin - normal coloration, no rashes      Labs:     CBC w/Diff CMP   Recent Labs   Lab 05/04/21  1001 04/30/21  0412 04/29/21  1022   WBC 5.63 6.68 8.06   Hgb 9.7* 9.7* 10.8*   Hematocrit 33.1* 32.9* 35.9   Platelets 87* 90* 107*   MCV 96.2* 94.8 93.2         PT/INR         Recent Labs   Lab 05/04/21  1001 04/30/21  0412 04/29/21  1022    Sodium 141 139 137   Potassium 5.3 4.7 4.5   Chloride 104 103 102   CO2 27 28 25    BUN 46.0* 40.0* 49.0*   Creatinine 4.5* 4.3* 5.4*   Glucose 173* 264* 152*   Calcium 8.9 8.3* 8.3*        Glucose POCT   Recent Labs   Lab 05/04/21  1001 04/30/21  0412 04/29/21  1022 04/28/21  1036   Glucose 173* 264* 152* 234*                    ABGs:    ABG CollectionSite   Date Value Ref Range Status   04/23/2021 Right Radl  Final     Allen's Test   Date Value Ref Range Status   04/23/2021 Yes  Final     pH, Arterial   Date Value Ref Range Status   04/23/2021 7.242 (L) 7.350 - 7.450 Final     pCO2, Arterial   Date Value Ref Range Status   04/23/2021 59.5 (H) 35.0 - 45.0 mmhg Final     pO2, Arterial   Date Value Ref Range Status   04/23/2021 <42.7 (L) 80.0 - 90.0 mmhg Final     Comment:     Critical pO2 results called to [id number].  Readback confirmed by MM at  04/23/2021  22:37       HCO3, Arterial   Date Value Ref Range Status   04/23/2021 24.7 23.0 - 29.0 mEq/L Final     Base Excess, Arterial   Date Value Ref Range Status   04/23/2021 -3.0 (L) -2.0 - 2.0 mEq/L Final     O2 Sat, Arterial   Date Value Ref Range Status   04/23/2021 45.3 (L) 95.0 - 100.0 % Final  Urinalysis        Invalid input(s): LEUKOCYTESUR      Rads:   No results found.      Quillian Quince MD  05/04/2021  6:44 PM

## 2021-05-05 ENCOUNTER — Observation Stay (HOSPITAL_BASED_OUTPATIENT_CLINIC_OR_DEPARTMENT_OTHER): Payer: 59

## 2021-05-05 DIAGNOSIS — I361 Nonrheumatic tricuspid (valve) insufficiency: Secondary | ICD-10-CM

## 2021-05-05 DIAGNOSIS — I509 Heart failure, unspecified: Secondary | ICD-10-CM

## 2021-05-05 DIAGNOSIS — I340 Nonrheumatic mitral (valve) insufficiency: Secondary | ICD-10-CM

## 2021-05-05 LAB — VANCOMYCIN, RANDOM: Vancomycin Random: 19.5 ug/mL

## 2021-05-05 LAB — ECHOCARDIOGRAM ADULT COMPLETE W CLR/ DOPP WAVEFORM
AV Area (Cont Eq VTI): 1.743
AV Area (Cont Eq VTI): 1.81
AV Mean Gradient: 7
AV Mean Gradient: 7
AV Mean Gradient: 7
AV Mean Gradient: 7
AV Mean Gradient: 8
AV Peak Velocity: 171
AV Peak Velocity: 176
AV Peak Velocity: 179
AV Peak Velocity: 181
AV Peak Velocity: 197
IVS Diastolic Thickness (2D): 1.14
LA Dimension (2D): 4.3
LA Dimension (2D): 4.4
LA Volume Index (BP A-L): 0.037
LVID diastole (2D): 4.07
LVID systole (2D): 2.85
MV Area (PHT): 3.372
MV E/A: 1.697
MV E/A: 1.9
MV E/e' (Average): 25.963
Mitral Valve Findings: NORMAL
Prox Ascending Aorta Diameter: 3.3
Pulmonary Valve Findings: NORMAL
RV Basal Diastolic Dimension: 4.12
RV Function: DECREASED
RV Systolic Pressure: 39
RV Systolic Pressure: 39.24
TAPSE: 1.35
Tricuspid Valve Findings: NORMAL

## 2021-05-05 LAB — GLUCOSE WHOLE BLOOD - POCT
Whole Blood Glucose POCT: 170 mg/dL — ABNORMAL HIGH (ref 70–100)
Whole Blood Glucose POCT: 347 mg/dL — ABNORMAL HIGH (ref 70–100)
Whole Blood Glucose POCT: 148 mg/dL — ABNORMAL HIGH (ref 70–100)
Whole Blood Glucose POCT: 133 mg/dL — ABNORMAL HIGH (ref 70–100)

## 2021-05-05 MED ORDER — VANCOMYCIN 1000 MG IN 250 ML NS IVPB VIAL-MATE (CNR)
1000.0000 mg | Freq: Once | INTRAVENOUS | Status: AC
Start: 2021-05-05 — End: 2021-05-05
  Administered 2021-05-05: 1000 mg via INTRAVENOUS
  Filled 2021-05-05: qty 1

## 2021-05-05 NOTE — Progress Notes (Signed)
PULMONARY PROGRESS NOTE                                                                                                              [id number]    Date Time: 05/05/21 4:13 PM  Patient Name: [name], [name] 66 y.o. female admitted with Altered mental status  Admit Date: 04/23/2021    Patient status: Observation  Hospital Day: 0           Assessment:   Musculoskeletal chest pain slightly better  Subcentimeter pulmonary nodules  Obstructive sleep apnea  End-stage renal disease  Swelling of the left breast suspicious for malignancy  Hypertension  Hyperlipidemia  Plan:   Consulted breast surgeon outpatient biopsy recommended  Dialysis as per nephrology  Nocturnal CPAP  Glycemic control  Follow pulmonary nodule outpatient, repeat CT scan in 3 to 6 months  Off antibiotics  DVT prophylaxis  Follow-up with pulmonary for pulmonary nodules  Subjective:     Patient seen and examined does not appear to be in any distress.  Chest pain seems to be improving with the lidocaine patch      Medications:     Current Facility-Administered Medications   Medication Dose Route Frequency    amLODIPine  10 mg Oral Daily    atorvastatin  40 mg Oral Daily    carvedilol  25 mg Oral Q12H SCH    dextrose  50 g Intravenous Once    gabapentin  100 mg Oral Q8H [place]    heparin (porcine)  5,000 Units Subcutaneous Q12H [place]    insulin glargine  40 Units Subcutaneous QHS    insulin lispro  1-4 Units Subcutaneous QHS    insulin lispro  1-8 Units Subcutaneous TID AC    insulin lispro  8 Units Subcutaneous TID AC    lidocaine  1 patch Transdermal Q24H    miconazole 2 % with zinc oxide   Topical BID    petrolatum   Topical Q12H    senna-docusate  2 tablet Oral QHS    sevelamer  800 mg Oral TID MEALS    vancomycin   Intravenous See Admin Instructions       Review of Systems:        General ROS: negative for - chills, fever or night sweats  Ophthalmic ROS: negative for - double vision, excessive tearing, itchy eyes or photophobia  ENT ROS: negative for -  headaches, nasal congestion, sore throat or vertigo  Respiratory ROS: Positive for musculoskeletal chest pain improving with lidocaine patch  cardiovascular ROS: negative for - chest pain, edema, orthopnea, rapid heart rate  Gastrointestinal ROS: negative for - abdominal pain, blood in stools, constipation, diarrhea, heartburn or nausea/vomiting  Musculoskeletal ROS: negative for - muscle pain  Neurological ROS: negative for - confusion, dizziness, headaches, speech problems, visual changes or weakness  Dermatological ROS: negative for dry skin, pruritus and rash      Physical Exam:     Vitals:    05/05/21 1522   BP: 124/56   Pulse: 64  Resp: 14   Temp: 97.8 F (36.6 C)   SpO2: 92%         Intake/Output Summary (Last 24 hours) at 05/05/2021 1613  Last data filed at 05/05/2021 1029  Gross per 24 hour   Intake 200 ml   Output --   Net 200 ml            General appearance - alert,  and in no distress  Mental status - alert, oriented to person, place, and time  Eyes - pupils equal and reactive, extraocular eye movements intact  Ears - no external ear lesions seen  Nose - no nasal discharge   Mouth - clear oral mucosa, moist   Neck - supple, no significant adenopathy  Chest -chest tender in the sternal area, swelling of the left breast  Heart - normal rate, regular rhythm, normal S1, S2, no murmurs, rubs, clicks or gallops  Abdomen - soft, nontender, nondistended, no masses or organomegaly  Neurological - alert, oriented, normal speech, no focal findings or movement disorder noted  Musculoskeletal - no joint swelling or tenderness  Extremities -  no pedal edema, no clubbing or cyanosis  Skin - normal coloration, no rashes      Labs:     CBC w/Diff CMP   Recent Labs   Lab 05/04/21  1001 04/30/21  0412 04/29/21  1022   WBC 5.63 6.68 8.06   Hgb 9.7* 9.7* 10.8*   Hematocrit 33.1* 32.9* 35.9   Platelets 87* 90* 107*   MCV 96.2* 94.8 93.2         PT/INR         Recent Labs   Lab 05/04/21  1001 04/30/21  0412 04/29/21  1022    Sodium 141 139 137   Potassium 5.3 4.7 4.5   Chloride 104 103 102   CO2 27 28 25    BUN 46.0* 40.0* 49.0*   Creatinine 4.5* 4.3* 5.4*   Glucose 173* 264* 152*   Calcium 8.9 8.3* 8.3*        Glucose POCT   Recent Labs   Lab 05/04/21  1001 04/30/21  0412 04/29/21  1022   Glucose 173* 264* 152*                    ABGs:    ABG CollectionSite   Date Value Ref Range Status   04/23/2021 Right Radl  Final     Allen's Test   Date Value Ref Range Status   04/23/2021 Yes  Final     pH, Arterial   Date Value Ref Range Status   04/23/2021 7.242 (L) 7.350 - 7.450 Final     pCO2, Arterial   Date Value Ref Range Status   04/23/2021 59.5 (H) 35.0 - 45.0 mmhg Final     pO2, Arterial   Date Value Ref Range Status   04/23/2021 <42.7 (L) 80.0 - 90.0 mmhg Final     Comment:     Critical pO2 results called to [id number].  Readback confirmed by MM at  04/23/2021  22:37       HCO3, Arterial   Date Value Ref Range Status   04/23/2021 24.7 23.0 - 29.0 mEq/L Final     Base Excess, Arterial   Date Value Ref Range Status   04/23/2021 -3.0 (L) -2.0 - 2.0 mEq/L Final     O2 Sat, Arterial   Date Value Ref Range Status   04/23/2021 45.3 (L) 95.0 - 100.0 % Final  Urinalysis        Invalid input(s): LEUKOCYTESUR      Rads:   No results found.      Quillian Quince MD  05/05/2021  4:13 PM

## 2021-05-05 NOTE — Plan of Care (Incomplete)
Problem: Moderate/High Fall Risk Score >5  Goal: Patient will remain free of falls  Outcome: Progressing  Flowsheets (Taken 05/05/2021 1928)  High (Greater than 13):   HIGH-Visual cue at entrance to patient's room   HIGH-Utilize chair pad alarm for patient while in the chair   HIGH-Consider use of low bed   HIGH-Initiate use of floor mats as appropriate   HIGH-Apply yellow "Fall Risk" arm band   HIGH-Bed alarm on at all times while patient in bed     Problem: Fluid and Electrolyte Imbalance/ Endocrine  Goal: Adequate hydration  Outcome: Progressing  Flowsheets (Taken 05/05/2021 1928)  Adequate hydration:   Assess mucus membranes, skin color, turgor, perfusion and presence of edema   Assess for peripheral, sacral, periorbital and abdominal edema   Monitor and assess vital signs and perfusion     Problem: Nutrition  Goal: Nutritional intake is adequate  Outcome: Progressing  Flowsheets (Taken 05/05/2021 1928)  Nutritional intake is adequate:   Monitor daily weights   Encourage/perform oral hygiene as appropriate   Encourage/administer dietary supplements as ordered (i.e. tube feed, TPN, oral, OGT/NGT, supplements)   Consult/collaborate with Clinical Nutritionist   Assess anorexia, appetite, and amount of meal/food tolerated   Consult/collaborate with Speech Therapy (swallow evaluations)   Include patient/patient care companion in decisions related to nutrition   Allow adequate time for meals   Assist patient with meals/food selection     Problem: Diabetes: Glucose Imbalance  Goal: Blood glucose stable at established goal  Outcome: Progressing  Flowsheets (Taken 05/05/2021 1928)  Blood glucose stable at established goal:   Monitor lab values   Follow fluid restrictions/IV/PO parameters   Assess for hypoglycemia /hyperglycemia   Coordinate medication administration with meals, as indicated   Ensure adequate hydration   Ensure patient/family has adequate teaching materials   Ensure appropriate consults are obtained  (Nutrition, Diabetes Education, and Case Management)   Ensure appropriate diet and assess tolerance   Monitor/assess vital signs   Include patient/family in decisions related to nutrition/dietary selections   Monitor intake and output.  Notify LIP if urine output is < 30 mL/hour.     Problem: Renal Instability  Goal: Nutritional intake is adequate  Outcome: Progressing  Flowsheets (Taken 05/05/2021 1928)  Nutritional intake is adequate:   Monitor daily weights   Encourage/perform oral hygiene as appropriate   Encourage/administer dietary supplements as ordered (i.e. tube feed, TPN, oral, OGT/NGT, supplements)   Consult/collaborate with Clinical Nutritionist   Assess anorexia, appetite, and amount of meal/food tolerated   Consult/collaborate with Speech Therapy (swallow evaluations)   Include patient/patient care companion in decisions related to nutrition   Allow adequate time for meals   Assist patient with meals/food selection  Goal: Free from infection  Outcome: Progressing  Flowsheets (Taken 05/05/2021 1928)  Free from infection:   Monitor/assess for signs and symptoms of infection   Assess need for indwelling catheter every shift and discuss with LIP     Problem: Safety  Goal: Patient will be free from injury during hospitalization  Outcome: Progressing  Flowsheets (Taken 05/05/2021 1928)  Patient will be free from injury during hospitalization:   Assess patient's risk for falls and implement fall prevention plan of care per policy   Include patient/ family/ care giver in decisions related to safety   Assess for patients risk for elopement and implement [place] per policy   Provide alternative method of communication if needed (communication boards, writing)   Ensure appropriate safety devices are available  at the bedside   Use appropriate transfer methods   Provide and maintain safe environment   Hourly rounding     Problem: Pain  Goal: Pain at adequate level as identified by patient  Outcome:  Progressing  Flowsheets (Taken 05/05/2021 1928)  Pain at adequate level as identified by patient:   Identify patient comfort function goal   Assess for risk of opioid induced respiratory depression, including snoring/sleep apnea. Alert healthcare team of risk factors identified.   Assess pain on admission, during daily assessment and/or before any "as needed" intervention(s)   Reassess pain within 30-60 minutes of any procedure/intervention, per Pain Assessment, Intervention, Reassessment (AIR) Cycle   Evaluate if patient comfort function goal is met   Offer non-pharmacological pain management interventions   Consult/collaborate with Physical Therapy, Occupational Therapy, and/or Speech Therapy   Include patient/patient care companion in decisions related to pain management as needed   Consult/collaborate with Pain Service   Evaluate patient's satisfaction with pain management progress     Problem: Discharge Barriers  Goal: Patient will be discharged home or other facility with appropriate resources  Outcome: Progressing  Flowsheets (Taken 05/05/2021 1928)  Discharge to home or other facility with appropriate resources:   Provide appropriate patient education   Provide information on available health resources   Initiate discharge planning     Problem: Compromised Tissue integrity  Goal: Nutritional status is improving  Outcome: Progressing  Flowsheets (Taken 05/05/2021 1928)  Nutritional status is improving:   Assist patient with eating   Collaborate with Clinical Nutritionist   Include patient/patient care companion in decisions related to nutrition   Allow adequate time for meals   Encourage patient to take dietary supplement(s) as ordered

## 2021-05-05 NOTE — Progress Notes (Addendum)
Auth status still pending for [employer]. CM asked Cyril Mourning with [facility] admissions for contact to auth dept to check. Kristen with admissions says all communication is through email. Cyril Mourning will call CM back with update. CM will continue to follow.    12:09- Kristen called CM to request updated PT/OT notes. Stat PT/OT order emailed to physical therapy. Per Danae Chen OT, patient is off floor and will be evaluated later. CM will continue to follow.    14:14- [facility] requested by [place]. Shnicka CM able to locate and upload active LTSS in [place]. CM will fax to [facility], along with updated PT/OT notes..    16:45- Kristen called Cm with room assignment. Pt going to [id number]. Nurse report number [phone]. CM will arrange transport and make family aware 4/14.    [facility]  [phone]

## 2021-05-05 NOTE — Progress Notes (Signed)
[facility] PROGRESS NOTE  [id number]    Date Time: 05/05/21 6:08 AM  Patient Name: [name]  Attending Physician: Barbaraann Faster, MD    CC: follow-up ESRD  HD note     Assessment:     ESRD on HD MWF at [facility] - followed by Dr. Randel Pigg  - noncompliant with HD  Hyperkalemia - resolved  Mild hypocalcemia due to CKD  Anemia in CKD - lower today  HTN - controlled  DM type 2 - uncontrolled  Hyperphosphatemia: better  Secondary hyperparathyroidism: PTH 1579 on 3/23  Left breast swelling - seen by onc, ddx includes malignancy  Abdominal wall abscess MRSA on vancomycin until 5/5  Elevated BNP 18K     Recommendations:     Next HD tomorrow  Await outpt placement      Case discussed with: pt, HD RN    Armando Reichert, MD  [facility]  [id number] (office)    Subjective:     Comfortable - no complaints      Review of Systems:     No cp  No sob      Physical Exam:     Vitals:    05/04/21 2309 05/05/21 0047 05/05/21 0313 05/05/21 0403   BP: 174/76  127/62    Pulse: 69  67    Resp: 19  22    Temp: 97.6 F (36.4 C)  97.8 F (36.6 C)    TempSrc: Oral  Axillary    SpO2: 96% 95% 100% 97%   Weight:   101.2 kg (223 lb 1.7 oz)    Height:         Intake and Output Summary (Last 24 hours) at Date Time    Intake/Output Summary (Last 24 hours) at 05/05/2021 0608  Last data filed at 05/04/2021 1500  Gross per 24 hour   Intake 1080 ml   Output 4600 ml   Net -3520 ml       General: awake, alert, oriented x 3, no acute distress  Cardiovascular: regular rate and rhythm, no murmurs, rubs or gallops  Lungs: clear to auscultation bilaterally, without wheezing, rhonchi, or rales  Abdomen: soft, non-tender, non-distended, normoactive bowel sounds  Extremities: +LE edema    Meds:      Scheduled Meds: PRN Meds:    amLODIPine, 10 mg, Oral, Daily  atorvastatin, 40 mg, Oral, Daily  carvedilol, 25 mg, Oral, Q12H [place]  dextrose, 50 g, Intravenous, Once  gabapentin, 100 mg, Oral, Q8H SCH  heparin (porcine),  5,000 Units, Subcutaneous, Q12H SCH  insulin glargine, 40 Units, Subcutaneous, QHS  insulin lispro, 1-4 Units, Subcutaneous, QHS  insulin lispro, 1-8 Units, Subcutaneous, TID AC  insulin lispro, 8 Units, Subcutaneous, TID AC  lidocaine, 1 patch, Transdermal, Q24H  miconazole 2 % with zinc oxide, , Topical, BID  petrolatum, , Topical, Q12H  senna-docusate, 2 tablet, Oral, QHS  sevelamer, 800 mg, Oral, TID MEALS  vancomycin, , Intravenous, See Admin Instructions          Continuous Infusions:   acetaminophen, 650 mg, Q6H PRN   Or  acetaminophen, 650 mg, Q6H PRN  albuterol-ipratropium, 3 mL, Q6H PRN  dextrose, 15 g of glucose, PRN   Or  dextrose, 12.5 g, PRN   Or  dextrose, 12.5 g, PRN   Or  glucagon (rDNA), 1 mg, PRN  guaiFENesin, 200 mg, Q4H PRN  hydrALAZINE, 10 mg, Q6H PRN  lactulose, 10 g, Daily PRN  magnesium sulfate, 1 g, PRN  melatonin, 3 mg, QHS PRN  naloxone, 0.2 mg, PRN  ondansetron, 4 mg, Q6H PRN   Or  ondansetron, 4 mg, Q6H PRN  oxyCODONE-acetaminophen, 1 tablet, Q4H PRN  polyethylene glycol, 17 g, Daily PRN          Labs:     Recent Labs   Lab 05/04/21  1001 04/30/21  0412 04/29/21  1022   WBC 5.63 6.68 8.06   Hgb 9.7* 9.7* 10.8*   Hematocrit 33.1* 32.9* 35.9   Platelets 87* 90* 107*       Recent Labs   Lab 05/04/21  1001 04/30/21  0412 04/29/21  1022   Sodium 141 139 137   Potassium 5.3 4.7 4.5   Chloride 104 103 102   CO2 27 28 25    BUN 46.0* 40.0* 49.0*   Creatinine 4.5* 4.3* 5.4*   Calcium 8.9 8.3* 8.3*   Glucose 173* 264* 152*   EGFR 11.8 12.5 9.6             Invalid input(s): LEUKOCYTESUR      Imaging personally reviewed, including: No results found.    Signed by: Armando Reichert, MD

## 2021-05-05 NOTE — Progress Notes (Signed)
Per request of hosp CM, Jacqulyn Cane - HBsAb, HBsAg and HD flow sheets were forwarded to [facility] via [employer].   SNF is also asking for HB Total Core Ab which is not avail.  Secure chat to CM to request that lab be ordered     Freida Busman, HD Care Coordinator  [phone]

## 2021-05-05 NOTE — Progress Notes (Signed)
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                  As appropriate, contact physician to consider change in therapy if cultures grow organism other than MRSA     Serum creatinine: 4.5 mg/dL (H) 05/04/21 1001  Estimated creatinine clearance: 13.1 mL/min (A)    Baseline SCr: on HD    Nephrotoxic drugs administered in the past 48 hours: Advanced Age (>65Y)    Assessment (Copy summary from [employer] for AUC/MIC dosing)  Measured Vancomycin Level:   Recent Labs   Lab 05/05/21  0323 05/04/21  0401 05/03/21  0446 05/02/21  1531 04/29/21  1022   Vancomycin Random 19.5 26.0 23.9 17.6 25.2         Recommendations:  Vancomycin 19.5 mcg/ml within therapeutic goal range (last HD on 4/12)  Will re-dose Vancomycin 1000 mg IV today  Ordered Vancomycin level with am labs on 4/15  Continue to dose by level    Thank you,  Andrey Farmer, PharmD BCCCP

## 2021-05-05 NOTE — Plan of Care (Addendum)
Pt AOX4, on 2L NC with SpO2 in the 90's. 2X BM this shift. Had echocardiogram done today. Sat in the chair for 2 hours.       Problem: Moderate/High Fall Risk Score >5  Goal: Patient will remain free of falls  Outcome: Progressing  Flowsheets (Taken 05/05/2021 0742)  High (Greater than 13):   HIGH-Visual cue at entrance to patient's room   HIGH-Utilize chair pad alarm for patient while in the chair   HIGH-Consider use of low bed   HIGH-Bed alarm on at all times while patient in bed   HIGH-Apply yellow "Fall Risk" arm band   HIGH-Initiate use of floor mats as appropriate     Problem: Nutrition  Goal: Nutritional intake is adequate  Outcome: Progressing  Flowsheets (Taken 05/05/2021 0742)  Nutritional intake is adequate:   Monitor daily weights   Assist patient with meals/food selection   Allow adequate time for meals   Encourage/perform oral hygiene as appropriate     Problem: Renal Instability  Goal: Nutritional intake is adequate  Outcome: Progressing  Flowsheets (Taken 05/05/2021 0742)  Nutritional intake is adequate:   Monitor daily weights   Assist patient with meals/food selection   Allow adequate time for meals   Encourage/perform oral hygiene as appropriate     Problem: Renal Instability  Goal: Fluid and electrolyte balance are achieved/maintained  Outcome: Progressing  Flowsheets (Taken 05/05/2021 0742)  Fluid and electrolyte balance are achieved/maintained:   Monitor intake and output every shift   Assess and reassess fluid and electrolyte status   Observe for cardiac arrhythmias     Problem: Pain  Goal: Pain at adequate level as identified by patient  Outcome: Progressing  Flowsheets (Taken 05/05/2021 0742)  Pain at adequate level as identified by patient:   Identify patient comfort function goal   Assess pain on admission, during daily assessment and/or before any "as needed" intervention(s)   Reassess pain within 30-60 minutes of any procedure/intervention, per Pain Assessment, Intervention, Reassessment  (AIR) Cycle   Evaluate if patient comfort function goal is met   Evaluate patient's satisfaction with pain management progress     Problem: Compromised Tissue integrity  Goal: Damaged tissue is healing and protected  Outcome: Progressing  Flowsheets (Taken 05/05/2021 0742)  Damaged tissue is healing and protected:   Provide wound care per wound care algorithm   Increase activity as tolerated/progressive mobility   Relieve pressure to bony prominences for patients at moderate and high risk   Avoid shearing injuries   Keep intact skin clean and dry

## 2021-05-05 NOTE — Progress Notes (Signed)
PROGRESS NOTE    Date Time: 05/05/21 7:18 AM  Patient Name: [name], [name]  Patient status: [place] [facility] Day: 0      Assessment:   Reproducible chest pain , left breast swelling status post chest compression x1.  Acute metabolic encephalopathy , s/p  RRT( chest compression x 1 ) , found with hypoxia, head CT negative.  Left breast swelling rib cage x-ray negative, ultrasound with inflammatory changes  Status post mechanical fall , slipped in bathroom injury to lower lip, chin.  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  Hyperkalemia.  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 Better with  Lidoderm   Out patient .Dr.Karzai   Vancomycin until 05/27/2021 (MRSA infection)  On Lantus, sliding scale coverage  On amlodipine Coreg, monitor blood pressure.  Off rocephin nail removed , Vanco with dialysis till 05/27/2021  Dialysis per nephrology / follow echo   Continue statin.  Monitor H&H and platelet count.  Does Not Use CPAP   Fall precaution, OT PT  DVT prophylaxis  DCP  - SNF in progress  per arrangements   Labs and data reviewed.  Discussed with patient and nurse  Discussed with Dr.Sothinathan   Subjective:   Patient seen and examined :  Intermittent chest pain with movements   Eating , without any difficulties  Review of Systems:   General ROS:   No fever chills  chest pain better ,no palpitation  Intermittent cough, reproducible chest pain.  No nausea vomiting abdominal  pain.  Generalized weakness  Medications:     Current Facility-Administered Medications   Medication Dose Route Frequency    amLODIPine  10 mg Oral Daily    atorvastatin  40 mg Oral Daily    carvedilol  25 mg Oral Q12H SCH    dextrose  50 g Intravenous Once    gabapentin  100 mg Oral Q8H [place]    heparin (porcine)  5,000 Units Subcutaneous Q12H [place]    insulin glargine  40 Units Subcutaneous QHS    insulin lispro  1-4 Units Subcutaneous QHS    insulin lispro  1-8 Units Subcutaneous TID AC    insulin lispro  8 Units Subcutaneous TID AC    lidocaine  1 patch Transdermal Q24H    miconazole 2 % with zinc oxide   Topical BID    petrolatum   Topical Q12H    senna-docusate  2 tablet Oral QHS    sevelamer  800 mg Oral TID MEALS    vancomycin   Intravenous See Admin Instructions       acetaminophen **OR** acetaminophen, albuterol-ipratropium, dextrose **OR** dextrose **OR** dextrose **OR** glucagon (rDNA), guaiFENesin, hydrALAZINE, lactulose, magnesium sulfate, melatonin, naloxone, ondansetron **OR** ondansetron, oxyCODONE-acetaminophen, polyethylene glycol    Physical Exam:   BP 127/62   Pulse 67   Temp 97.8 F (36.6 C) (Axillary)   Resp 22   Ht 1.575 m (5\' 2" )   Wt  101.2 kg (223 lb 1.7 oz)   LMP  (LMP Unknown) Comment: post menopausal  SpO2 97%   BMI 40.81 kg/m     Intake and Output Summary (Last 24 hours) at Date Time    Intake/Output Summary (Last 24 hours) at 05/05/2021 0718  Last data filed at 05/04/2021 1500  Gross per 24 hour   Intake 1080 ml   Output 4600 ml   Net -3520 ml         General: AAO x 3   Neck: Supple no JVD  ENT: PERRLA, EOMI  Lungs: Clear to auscultation no crackles or wheezing  Heart: Regular rate and rhythm no gallop   Abdomen: Soft nontender bowel sounds positive  Breast, left breast swelling, edema improving.  Extremities: Warm moist  Neuro: Grossly intact  GU: No CVA tenderness  Hemodialysis dependent  Labs:     CBC w/Diff CMP   Recent Labs   Lab 05/04/21  1001 04/30/21  0412 04/29/21  1022    WBC 5.63 6.68 8.06   Hgb 9.7* 9.7* 10.8*   Hematocrit 33.1* 32.9* 35.9   Platelets 87* 90* 107*   MCV 96.2* 94.8 93.2         PT/INR         Recent Labs   Lab 05/04/21  1001 04/30/21  0412 04/29/21  1022   Sodium 141 139 137   Potassium 5.3 4.7 4.5   Chloride 104 103 102   CO2 27 28 25    BUN 46.0* 40.0* 49.0*   Creatinine 4.5* 4.3* 5.4*   Glucose 173* 264* 152*   Calcium 8.9 8.3* 8.3*        Glucose POCT   Recent Labs   Lab 05/04/21  1001 04/30/21  0412 04/29/21  1022 04/28/21  1036   Glucose 173* 264* 152* 234*                  Rads:   No results found.      Barbaraann Faster, MD  05/05/2021    *This note was generated by the Epic EMR system/ Dragon speech recognition and may contain inherent errors or omissions not intended by the user. Grammatical errors, random word insertions, deletions, pronoun errors and incomplete sentences are occasional consequences of this technology due to software limitations. Not all errors are caught or corrected. If there are questions or concerns about the content of this note or information contained within the body of this dictation they should be addressed directly with the author for clarification

## 2021-05-05 NOTE — OT Progress Note (Signed)
Occupational Therapy Treatment  [name]        Post Acute Care Therapy Recommendations   Discharge Recommendations:  SNF    If recommended discharge disposition is not available, patient will require the following assistance: [place] aide, Supervision, Assist with OOB activity, Assist with I/ADLs, and HH therapy    DME needs IF patient is discharging home: [facility]    Therapy discharge recommendations may change with patient status.  Please refer to most recent note for up-to-date recommendations.    Is PT evaluation indicated at this time? This patient is already on PT caseload.    Unit: [place] [facility] INTERMEDIATE CARE  Bed: U3149/F0263-Z      ___________________________________________________    Time of treatment:  Time Calculation  OT Received On: 05/05/21  Start Time: 1428  Stop Time: 1450  Time Calculation (min): 22 min  Total Treatment Time (min):  (11 min)  Co-treat with PT to maximize therapeutic benefit to pt. Billed for above treatment time only.           Chart Review and Collaboration with Care Team: 5 minutes, not included in above time.    OT Visit Number: 3      Precautions and Contraindications:    Precautions  Weight Bearing Status: RLE WBAT  Precaution Instructions Given to Patient: Yes  Fall Risks: High;History of fall(s);Impaired balance/gait;Impaired mobility;Muscle weakness  Other Precautions: contact, falls    Personal Protective Equipment (PPE)  gloves, procedure gown, and procedure mask    Updated Labs:  Lab Results   Component Value Date/Time    HGB 9.7 (L) 05/04/2021 10:01 AM    HCT 33.1 (L) 05/04/2021 10:01 AM    K 5.3 05/04/2021 10:01 AM    NA 141 05/04/2021 10:01 AM    INR 1.2 (H) 03/04/2021 01:23 AM    TROPI 72.2 (AA) 04/25/2021 10:46 PM    TROPI 59.2 (A) 04/25/2021 06:20 PM    TROPI 49.8 (A) 04/25/2021 03:56 AM    TROPI 58.6 (A) 04/24/2021 06:34 PM    TROPI 0.01 03/18/2006 06:10 PM       All imaging reviewed, please see chart for details.    Subjective:    Marland Kitchen  Pt reporting  fatigue following echo       Patient's medical condition is appropriate for Occupational Therapy intervention at this time.  Patient is agreeable to participation in the therapy session. Nursing clears patient for therapy.  Pain Assessment  Pain Assessment: Numeric Scale (0-10)  Pain Score:  (did not quantify pain)  Pain Location: Chest (since admission)  Pain Frequency: Increases with movement  Pain Intervention(s): Medication (See eMAR) (RN present and aware)      Objective:  Observation of Patient/Vital Signs:    BP 135/64   Pulse 69   Temp 98.4 F (36.9 C) (Axillary)   Resp 18   Ht 1.575 m (5\' 2" )   Wt 93.9 kg (207 lb 0.2 oz)   LMP  (LMP Unknown) Comment: post menopausal  SpO2 98%   BMI 37.86 kg/m       Cognition/Neuro Status  Arousal/Alertness: Appropriate responses to stimuli  Attention Span: Appears intact  Orientation Level: Oriented X4  Memory: Appears intact  Following Commands: Follows all commands and directions without difficulty  Safety Awareness: minimal verbal instruction  Insights: Fully aware of deficits;Educated in safety awareness  Problem Solving: supervision  Behavior: calm;cooperative (lethargic initially)  Motor Planning: intact  Coordination: intact    Functional Mobility  Supine to  Sit Transfers: Moderate Assist (x2 person A)  Sit to Supine Transfers: Moderate Assist  Sit to Stand Transfers: Moderate Assist (x1 trial from EOB and from [facility] w/ RW, poor carry over instruction for hand placement)  Stand to Sit Transfers: Minimal Assist  Functional Mobility/Ambulation: Contact Guard Assist (w/ RW)    Self-care and Home Management  Toileting: Dependent;standing;bedside commode (peri care +BM; clothing mngmt)  Edu on option for AE re: reacher and how to obtain. Pt verbalizes understanding.       Engaged in dynamic standing reaching activity BUE anteriorly w/ RW support and CGA.                             Educated the Patient to role of occupational therapy, plan of care, goals of  therapy and safety with mobility and ADLs with verbalized understanding .     Patient left in bed with alarm and all other medical equipment in place and call bell and all personal items/needs within reach.  RN notified of session outcome.  CM team and attending have been previously notified of d/c recommendation; no updates to d/c recommendation since that time.      Assessment:  Patient continues to present with decreased activity tolerance, strength and balance. Partial carry over throughout of instruction for safety and technique during functional transfers. Pt will continue to benefit from skilled OT interventions to address the above deficits and maximize functional performance. Continue to recommend SNF post [place].            PMP Activity: Step 5 - Chair  Distance Walked (ft) (Step 6,7): 5 Feet      Plan:  OT Frequency Recommended: 2-3x/wk  Goal Formulation: Patient      Time For Goal Achievement: by time of discharge  ADL Goals  Patient will groom self: Supervision, Goal met  Patient will dress upper body: Supervision, Not met  Patient will dress lower body: Minimal Assist, Not met  Patient will toilet: Minimal Assist, Not met  Mobility and Transfer Goals  Pt will transfer bed to toilet: Minimal Assist, Not met (Progressing)                       Continue plan of care.      Barnetta Chapel, [place], [place] [phone]  05/05/2021 3:04 PM    [facility]  Patient: MARGURETTE BRENER MRN#: [id number]   Unit: [place] [facility] INTERMEDIATE CARE Bed: E9381/O1751-W

## 2021-05-05 NOTE — PT Progress Note (Signed)
Physical Therapy Note  Physical Therapy Treatment  [name]  Post Acute Care Therapy Recommendations   Discharge Recommendations:  SNF    If recommended discharge disposition is not available, patient will require the following assistance: HH aide, Total assist, 24 hour supervision, and [place] therapy    DME needs IF patient is discharging home: No additional equipment/DME recommended at this time    Therapy discharge recommendations may change with patient status.  Please refer to most recent note for up-to-date recommendations.    Is an Occupational Therapy Evaluation Indicated at this time? This patient is already on OT caseload.      Unit: [place] [facility] INTERMEDIATE CARE  Bed: K4401/U2725-D    ___________________________________________________    Time of treatment:  Time Calculation  PT Received On: 05/05/21  Start Time: 1428  Stop Time: 1450  Time Calculation (min): 22 min  Total Treatment Time (min): 11 (co-tx c OT)            Chart Review and Collaboration with Care Team: 7 minutes, not included in above time.    PT Visit Number: 3    ___________________________________________________    Precautions:   Precautions  Weight Bearing Status: RLE WBAT  Precaution Instructions Given to Patient: Yes  Fall Risks: High, History of fall(s), Impaired balance/gait, Impaired mobility, Muscle weakness  Other Precautions: Contact    Personal Protective Equipment (PPE)  gloves, procedure gown, and procedure mask    Updated X-Rays/Tests/Labs:  Lab Results   Component Value Date/Time    HGB 9.7 (L) 05/04/2021 10:01 AM    HCT 33.1 (L) 05/04/2021 10:01 AM    K 5.3 05/04/2021 10:01 AM    NA 141 05/04/2021 10:01 AM    INR 1.2 (H) 03/04/2021 01:23 AM    TROPI 72.2 (AA) 04/25/2021 10:46 PM    TROPI 59.2 (A) 04/25/2021 06:20 PM    TROPI 49.8 (A) 04/25/2021 03:56 AM    TROPI 58.6 (A) 04/24/2021 06:34 PM    TROPI 0.01 03/18/2006 06:10 PM       All imaging reviewed, please see chart for details.      Subjective:  "Too much  pain in chest when move"    Patient Goal: agreed to work with PT    Pain Assessment  Pain Assessment: Wong-Baker FACES  Wong-Baker FACES Pain Rating: Hurts whole lot (during supine <>sit transition)  POSS Score: Sleep  Pain Location: Rib cage  Pain Orientation: Anterior  Pain Descriptors: Patient unable to describe  Pain Frequency: Intermittent;Increases with movement  Effect of Pain on Daily Activities: moderate  Pain Intervention(s): Medication (See eMAR);Repositioned;Distraction;Elevated;Emotional support  Multiple Pain Sites: No           Patient's medical condition is appropriate for Physical Therapy intervention at this time.  Patient is agreeable to participation in the therapy session. Nursing clears patient for therapy.      Objective:  Observation of Patient/Vital Signs:  Blood pressure 135/64, pulse 69, temperature 98.4 F (36.9 C), temperature source Axillary, resp. rate 18, height 1.575 m (5\' 2" ), weight 93.9 kg (207 lb 0.2 oz), SpO2 98 %.      Cognition/Neuro Status  Arousal/Alertness: Appropriate responses to stimuli  Attention Span: Appears intact  Orientation Level: Oriented X4  Memory: Appears intact  Following Commands: Follows all commands and directions without difficulty  Safety Awareness: minimal verbal instruction  Insights: Fully aware of deficits;Educated in safety awareness  Problem Solving: supervision  Behavior: calm;cooperative  Motor Planning: intact  Coordination:  intact    Musculoskeletal Examination  Gross ROM  Neck/Trunk ROM: within functional limits  Right Upper Extremity ROM: within functional limits  Left Upper Extremity ROM: within functional limits  Right Lower Extremity ROM: within functional limits  Left Lower Extremity ROM: unable to assess (2/2 pain w/ mvmt from HD fistula)               Functional Mobility  Supine to Sit: Moderate Assist;Increased Time;Increased Effort;HOB raised;to Right (BLE)  Scooting to EOB: Increased Time;Minimal Assist;Increased Effort  Sit to  Supine: Moderate Assist;to Left (BLE)  Sit to Stand: Moderate Assist;Increased Time;Increased Effort;with instruction for hand placement to increase safety  Stand to Sit: Minimal Assist (eccentric trunk control)  Transfers  Bed to Chair: Contact Guard Assist;to Right  Chair to Bed: Contact Guard Assist;to Left;to Right  Stand Pivot Transfers: Youth worker Used for Functional Transfer: front-wheeled walker  Locomotion  Ambulation: Contact Guard Assist;with front-wheeled walker  Pattern: shuffle;R foot decreased clearance;L foot decreased clearance;decreased cadence;decreased step length  Distance Walked (ft) (Step 6,7): 5 Feet          Neuro Re-Ed  Sitting Balance: without support;without instruction;independent  Standing Balance: patient education;standing reaching activities;with instruction;with support;contact guard assist (with FWW while peri-care performed)           Educated the Patient to role of physical therapy, plan of care, goals of therapy and safety with mobility and ADLs, energy conservation techniques, discharge instructions with verbalized understanding .    Patient left in bed with alarm and all other medical equipment in place and call bell and all personal items/needs within reach.  RN notified of session outcome. CM team and attending have been previously notified of d/c recommendation; no updates to d/c recommendation since that time.      Assessment:  Pt presents with c/o pain, decreased in strength, balance and activity tolerance. Pt continues to demo functional limitations w/ bed mobility, transfer, and ambulation. Pt would benefit from continued skilled PT to    Address impairments and improve functional limitations to facilitate independence w/mobility and promote safe d/c.      PMP Activity: Step 5 - Chair  Distance Walked (ft) (Step 6,7): 5 Feet    Plan:  Treatment/Interventions: Bed mobility, Equipment eval/education, Patient/family training, Endurance training, LE  strengthening/ROM, Functional transfer training, Neuromuscular re-education, Gait training, Exercise      PT Frequency: 2-3x/wk   Continue plan of care.    Goals:  Goals  Goal Formulation: With patient  Time for Goal Acheivement: By time of discharge  Goals: Select goal  Pt Will Go Supine To Sit: with stand by assist, to maximize functional mobility and independence  Pt Will Perform Sit To Supine: with stand by assist, to maximize functional mobility and independence  Pt Will Perform Sit to Stand: modified independent, to maximize functional mobility and independence, Partly met (met goal of CGA from bedside commode; Min A/CGA from EOB)  Pt Will Transfer Bed/Chair: with rolling walker, modified independent, to maximize functional mobility and independence, Partly met (met goal of CGA to [facility])  Pt Will Ambulate: 51-100 feet, with rolling walker, with stand by assist, to maximize functional mobility and independence, Partly met  Pt Will Perform Home Exer Program: independent, to maximize functional mobility and independence, Partly met      Jake Seats, MS, MPT  Physical Therapist  [id number]  Monday 13:30 - 21:00  Tuesday, Wednesday 14:00-21:00  Thursday, Friday 10:30 - 21:00  [facility]  Patient: [name] MRN#: [id number]  Unit: [place] [facility] INTERMEDIATE CARE Bed: H2992/E2683-M

## 2021-05-05 NOTE — OT Progress Note (Signed)
Occupational Therapy Note    [facility]   Occupational Therapy Attempt Note    Patient:  [name] MRN#:  [id number]  Unit:  [place] [facility] INTERMEDIATE CARE Room/Bed:  U1222/I1146-W      Occupational Therapy treatment attempted on 05/05/2021 at 11:21 AM unable to complete secondary to RN requesting hold at this time 2/2 pt going down for echo.  Occupational therapy will follow up at a later time/date as able.    RN aware.       Rolin Barry [place], [place]  [phone]  Physical Medicine and [facility]    05/05/2021  11:22 AM

## 2021-05-05 NOTE — Progress Notes (Incomplete)
PROGRESS NOTE     Date Time: 05/05/21 8:41 AM  Patient Name: DianeDiane Young      ASSESSMENT:   Peau'D orange type skin changes of the left breast without overt masses, erythema-  likely dependent edema -concern for malignancy on USG- outpatient biopsy recommended- improving post dialysis  Acute on Chronic Thrombocytopenia, likely ITP- post infectious exacerbation- slowly dropping  Chronic Normocytic Anemia, due to CKD - stable  Subcentimeter pulmonary nodule- pulmonology following  Acute encephalopathy, status post RRT( chest compression x 1 ) , found with hypoxia, head CT negative.- at baseline  Status post mechanical fall , slipped in bathroom injury to lower lip, chin.  Injury to left chest wall, edema, X Ray Neg for Fx, right great toe from fall  Chest pain, likely Musculoskeletal  Recent abdominal wall abscess MRSA at PD catheter site on Vanco at dialysis.  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  Continue pain management  DVT ppx  Continue home meds for comorbid conditions    Subjective:     Chest pain is improving with the lidocaine patch  Denies sob  No other new complaints   No fever or chills      Medications:      Scheduled Meds: PRN Meds:    amLODIPine, 10 mg, Oral, Daily  atorvastatin, 40 mg, Oral,  Daily  carvedilol, 25 mg, Oral, Q12H SCH  dextrose, 50 g, Intravenous, Once  gabapentin, 100 mg, Oral, Q8H SCH  heparin (porcine), 5,000 Units, Subcutaneous, Q12H SCH  insulin glargine, 40 Units, Subcutaneous, QHS  insulin lispro, 1-4 Units, Subcutaneous, QHS  insulin lispro, 1-8 Units, Subcutaneous, TID AC  insulin lispro, 8 Units, Subcutaneous, TID AC  lidocaine, 1 patch, Transdermal, Q24H  miconazole 2 % with zinc oxide, , Topical, BID  petrolatum, , Topical, Q12H  senna-docusate, 2 tablet, Oral, QHS  sevelamer, 800 mg, Oral, TID MEALS  vancomycin, , Intravenous, See Admin Instructions         acetaminophen, 650 mg, Q6H PRN   Or  acetaminophen, 650 mg, Q6H PRN  albuterol-ipratropium, 3 mL, Q6H PRN  dextrose, 15 g of glucose, PRN   Or  dextrose, 12.5 g, PRN   Or  dextrose, 12.5 g, PRN   Or  glucagon (rDNA), 1 mg, PRN  guaiFENesin, 200 mg, Q4H PRN  hydrALAZINE, 10 mg, Q6H PRN  lactulose, 10 g, Daily PRN  magnesium sulfate, 1 g, PRN  melatonin, 3 mg, QHS PRN  naloxone, 0.2 mg, PRN  ondansetron, 4 mg, Q6H PRN   Or  ondansetron, 4 mg, Q6H PRN  oxyCODONE-acetaminophen, 1 tablet, Q4H PRN  polyethylene glycol, 17 g, Daily PRN          I personally reviewed all of the medications    Review of Systems:  Per narrative, 2 system review was done.    Physical Exam:   Visit Vitals  BP 157/68   Pulse 67   Temp 98.5 F (36.9 C) (Axillary)   Resp 18   Ht 1.575 m (5\' 2" )   Wt 93.9 kg (207 lb 0.2 oz)   LMP  (LMP Unknown)   SpO2 100%   BMI 37.86 kg/m         Intake/Output Summary (Last 24 hours) at 05/05/2021 0841  Last data filed at 05/04/2021 1500  Gross per 24 hour   Intake 600 ml   Output 4600 ml   Net -4000 ml            Physical Exam:      General: no acute distress.  Skin: no rashes or lesions.  HENT: no scleral icterus, no lymphadenopathy.  Lungs: Bilateral breath sounds anterior chest,   Bilateral breast examination was done and left breast appears tender to touch with some skin edema and pitting without any overt erythema   or palpable lumps.  Right breast exam was normal.  Heart: normal rate, regular rhythm, no murmur.  Abdomen: soft, non-tender, no masses.  Musculoskeletal: Right great toenail removed.   Neurologic: alert, and oriented X3, CN normal speech, no focal findings or movement disorder noted.            Labs:     Recent Labs   Lab 05/04/21  1001 04/30/21  0412 04/29/21  1022   Glucose 173* 264* 152*   BUN 46.0* 40.0* 49.0*   Creatinine 4.5* 4.3* 5.4*   Calcium 8.9 8.3* 8.3*   Sodium 141 139 137   Potassium 5.3 4.7 4.5   Chloride 104 103 102   CO2 27 28 25        Recent Labs   Lab 05/04/21  1001 04/30/21  0412 04/29/21  1022   WBC 5.63 6.68 8.06   Hgb 9.7* 9.7* 10.8*   Hematocrit 33.1* 32.9* 35.9   MCV 96.2* 94.8 93.2   MCH 28.2 28.0 28.1   MCHC 29.3* 29.5* 30.1*   Platelets 87* 90* 107*         No results for input(s): PTT, PT, INR in the last 72 hours.      Microbiology:  Microbiology Results (last 15 days)       Procedure Component Value Units Date/Time    COVID-19 (SARS-CoV-2) only ([facility]) asymptomatic admission [[id number]] Collected: 04/23/21 1352    Order Status: Completed Specimen: Nasopharyngeal Updated: 04/23/21 1429     Purpose of COVID testing Screening     SARS-CoV-2 Specimen Source Nasal Swab     SARS CoV 2 Overall Result Not Detected     Comment: __________________________________________________  -A result of "Detected" indicates POSITIVE for the    presence of SARS CoV-2 RNA  -A result of "Not Detected" indicates NEGATIVE for the    presence of SARS CoV-2 RNA  __________________________________________________________  Test performed using the Roche cobas Liat SARS-CoV-2 assay. This assay is  only for use under the Food and Drug Administration's Emergency Use  Authorization. This is a real-time RT-PCR assay for the qualitative  detection of SARS-CoV-2 RNA. Viral nucleic acids may persist in vivo,  independent of viability. Detection of viral nucleic acid does not imply the  presence of infectious virus,  or that virus nucleic acid is the cause of  clinical symptoms. Negative results do not preclude SARS-CoV-2 infection and  should not be used as the sole basis for  diagnosis, treatment or other  patient management decisions. Negative results must be combined with  clinical observations, patient history, and/or epidemiological information.  Invalid results may be due to inhibiting substances in the specimen and  recollection should occur. Please see Fact Sheets for patients and providers  located:  https://www.[facility]-ch[facility].com/         Narrative:      o Collect and clearly label specimen type:  o PREFERRED-Upper respiratory specimen: One Nasal Swab in  Transport Media.  o Hand deliver to laboratory ASAP  Indication for testing->Extended care facility admission to  semi private room  Screening                Signed by: Cindi Carbon, MD

## 2021-05-06 LAB — GLUCOSE WHOLE BLOOD - POCT
Whole Blood Glucose POCT: 135 mg/dL — ABNORMAL HIGH (ref 70–100)
Whole Blood Glucose POCT: 266 mg/dL — ABNORMAL HIGH (ref 70–100)

## 2021-05-06 LAB — HEPATITIS B CORE ANTIBODY, IGM: Hepatitis B Core IgM: NONREACTIVE

## 2021-05-06 MED ORDER — ALBUMIN HUMAN 25 % IV SOLN
100.0000 mL | INTRAVENOUS | Status: DC | PRN
Start: 2021-05-06 — End: 2021-05-06

## 2021-05-06 MED ORDER — SODIUM CHLORIDE 0.9 % IV BOLUS
250.0000 mL | INTRAVENOUS | Status: AC | PRN
Start: 2021-05-06 — End: 2021-05-06

## 2021-05-06 MED ORDER — SODIUM CHLORIDE 0.9 % IV BOLUS
100.0000 mL | INTRAVENOUS | Status: AC | PRN
Start: 2021-05-06 — End: 2021-05-06

## 2021-05-06 NOTE — Progress Notes (Signed)
PULM. CCM PROGRESS NOTE    Date Time: 05/06/21 2:37 PM  Patient Name: [name],[name]      Assessment:     .  End-stage renal disease  .  Sleep apnea  .  Hypertension  .  Hyperlipidemia  .  Diabetes mellitus  .  Subcentimeter pulmonary nodules    Plan:     .  End-stage renal disease, dialysis dependent, follow electrolytes  .  Nocturnal BiPAP  .  Hypertension, on amlodipine and Coreg, stable blood pressure  .  Continue Lipitor at 40 mg daily  .  Diabetes mellitus, on Lantus insulin 40 units at bedtime, also on sliding scale  .  Subcentimeter pulmonary nodules, follow-up CT as an outpatient    Subjective:   Patient is a 66 y.o. female who is awake, alert, and comfortable.     Medications:     Current Facility-Administered Medications   Medication Dose Route Frequency    amLODIPine  10 mg Oral Daily    atorvastatin  40 mg Oral Daily    carvedilol  25 mg Oral Q12H SCH    dextrose  50 g Intravenous Once    gabapentin  100 mg Oral Q8H [place]    heparin (porcine)  5,000 Units Subcutaneous Q12H [place]    insulin glargine  40 Units Subcutaneous QHS    insulin lispro  1-4 Units Subcutaneous QHS    insulin lispro  1-8 Units Subcutaneous TID AC    insulin lispro  8 Units Subcutaneous TID AC    lidocaine  1 patch Transdermal Q24H    miconazole 2 % with zinc oxide   Topical BID    petrolatum   Topical Q12H    senna-docusate  2 tablet Oral QHS    sevelamer  800 mg Oral TID MEALS    vancomycin   Intravenous See Admin Instructions       Review of Systems:     General ROS: negative for - chills, fever or night sweats  Ophthalmic ROS: negative for - double vision, excessive tearing, itchy eyes or photophobia  ENT ROS: negative for - headaches, nasal congestion, sore throat or vertigo  Respiratory ROS: negative for - cough, hemoptysis, orthopnea, pleuritic pain, shortness of breath, tachypnea or wheezing  Cardiovascular ROS: negative for - chest pain, edema, orthopnea, rapid heart rate  Gastrointestinal ROS: negative for - abdominal  pain, blood in stools, constipation, diarrhea, heartburn or nausea/vomiting  Musculoskeletal ROS: negative for - muscle pain  Neurological ROS: negative for - confusion, dizziness, headaches, speech problems, visual changes or weakness  Dermatological ROS: negative for dry skin, pruritus and rash    Physical Exam:   BP 127/60   Pulse 63   Temp 97.6 F (36.4 C)   Resp 16   Ht 1.575 m (5\' 2" )   Wt 95.4 kg (210 lb 5.1 oz)   LMP  (LMP Unknown) Comment: post menopausal  SpO2 93%   BMI 38.47 kg/m     Intake and Output Summary (Last 24 hours) at Date Time    Intake/Output Summary (Last 24 hours) at 05/06/2021 1437  Last data filed at 05/06/2021 1345  Gross per 24 hour   Intake 240 ml   Output 3600 ml   Net -3360 ml       General appearance - alert,  and in no distress  Mental status - alert, oriented to person, place, and time  Eyes - pupils equal and reactive, extraocular eye movements intact  Ears - no  external ear lesions seen  Nose - no nasal discharge   Mouth - clear oral mucosa, moist   Neck - supple, no significant adenopathy  Chest - clear to auscultation, no wheezes, rales or rhonchi, symmetric air entry  Heart - normal rate, regular rhythm, normal S1, S2, no rubs, clicks or gallops  Abdomen - soft, nontender, nondistended, no masses or organomegaly  Neurological - alert, oriented, normal speech, no focal findings or movement disorder noted  Musculoskeletal - no joint swelling or tenderness  Extremities -  no pedal edema, no clubbing or cyanosis  Skin - normal coloration, no rashes    Labs:     Recent Labs   Lab 05/04/21  1001 04/30/21  0412   WBC 5.63 6.68   Hgb 9.7* 9.7*   Hematocrit 33.1* 32.9*   Platelets 87* 90*   MCV 96.2* 94.8     Recent Labs   Lab 05/04/21  1001 04/30/21  0412   Sodium 141 139   Potassium 5.3 4.7   Chloride 104 103   CO2 27 28   BUN 46.0* 40.0*   Creatinine 4.5* 4.3*   Glucose 173* 264*   Calcium 8.9 8.3*     Glucose:    Recent Labs   Lab 05/04/21  1001 04/30/21  0412   Glucose  173* 264*           Rads:   Radiological Procedure reviewed.  Radiology Results (24 Hour)       ** No results found for the last 24 hours. Inda Castle, MD  Pulmonary and critical care  05/06/21

## 2021-05-06 NOTE — Progress Notes (Signed)
PROGRESS NOTE     Date Time: 05/06/21 9:07 AM  Patient Name: [name],[name]      ASSESSMENT:   Peau'D orange type skin changes of the left breast without overt masses, erythema-  likely dependent edema -concern for malignancy on USG- outpatient biopsy recommended- improving post dialysis  Acute on Chronic Thrombocytopenia, likely ITP- post infectious exacerbation- stable  Chronic Normocytic Anemia, due to CKD - stable  Subcentimeter pulmonary nodule- pulmonology following  Acute encephalopathy, status post RRT( chest compression x 1 ) , found with hypoxia, head CT negative.- at baseline   Status post mechanical fall , slipped in bathroom injury to lower lip, chin.  Injury to left chest wall, edema, X Ray Neg for Fx, right great toe from fall  Chest pain, likely Musculoskeletal- improving  Recent abdominal wall abscess MRSA at PD catheter site on Vanco at dialysis.  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  Continue pain management  DVT ppx  Continue home meds for comorbid conditions    Awaiting discharge to SNF    Subjective:     Pain controlled on meds  Denies shortness of breath or chest pain   No Nausea or vomiting, no abdominal pain      4/13- ECHO- LVEF 55-60%. Grade II diastolic dysfunction with elevated left atrial pressure. RV size  is dilated. Decreased RV systolic function    Medications:      Scheduled Meds: PRN Meds:    amLODIPine, 10 mg, Oral, Daily  atorvastatin, 40 mg, Oral, Daily  carvedilol, 25 mg, Oral, Q12H SCH  dextrose, 50 g, Intravenous, Once  gabapentin, 100 mg, Oral, Q8H SCH  heparin (porcine), 5,000 Units, Subcutaneous, Q12H SCH  insulin glargine, 40 Units, Subcutaneous, QHS  insulin lispro, 1-4 Units, Subcutaneous, QHS  insulin lispro, 1-8 Units, Subcutaneous, TID AC  insulin lispro, 8 Units, Subcutaneous, TID AC  lidocaine, 1 patch, Transdermal, Q24H  miconazole 2 % with zinc oxide, , Topical, BID  petrolatum, , Topical, Q12H  senna-docusate, 2 tablet, Oral, QHS  sevelamer, 800 mg, Oral, TID MEALS  vancomycin, , Intravenous, See Admin Instructions         acetaminophen, 650 mg, Q6H PRN   Or  acetaminophen, 650 mg, Q6H PRN  albuterol-ipratropium, 3 mL, Q6H PRN  dextrose, 15 g of glucose, PRN   Or  dextrose, 12.5 g, PRN   Or  dextrose, 12.5 g, PRN   Or  glucagon (rDNA), 1 mg, PRN  guaiFENesin, 200 mg, Q4H PRN  hydrALAZINE, 10 mg, Q6H PRN  lactulose, 10 g, Daily PRN  magnesium sulfate, 1 g, PRN  melatonin, 3 mg, QHS PRN  naloxone, 0.2 mg, PRN  ondansetron, 4 mg, Q6H PRN   Or  ondansetron, 4 mg, Q6H PRN  oxyCODONE-acetaminophen, 1 tablet, Q4H PRN  polyethylene  glycol, 17 g, Daily PRN  sodium chloride, 100 mL, Q1H PRN  sodium chloride, 250 mL, PRN          I personally reviewed all of the medications    Review of Systems:   Per narrative, 2 system review was done.    Physical Exam:   Visit Vitals  BP 150/66   Pulse 66   Temp 98 F (36.7 C) (Oral)   Resp 22   Ht 1.575 m (5\' 2" )   Wt 95.4 kg (210 lb 5.1 oz)   LMP  (LMP Unknown)   SpO2 96%   BMI 38.47 kg/m         Intake/Output Summary (Last 24 hours) at 05/06/2021 6438  Last data filed at 05/05/2021 1430  Gross per 24 hour   Intake 400 ml   Output --   Net 400 ml            Physical Exam:      General: no acute distress.  Skin: no rashes or lesions.  HENT: no scleral icterus, no  lymphadenopathy.  Lungs: Bilateral breath sounds anterior chest,   Bilateral breast examination was done - improved skin edema  Heart: normal rate, regular rhythm, no murmur.  Abdomen: soft, non-tender, no masses.  Musculoskeletal: Right great toenail removed.   Neurologic: alert, and oriented X3, CN normal speech, no focal findings or movement disorder noted.            Labs:     Recent Labs   Lab 05/04/21  1001 04/30/21  0412 04/29/21  1022   Glucose 173* 264* 152*   BUN 46.0* 40.0* 49.0*   Creatinine 4.5* 4.3* 5.4*   Calcium 8.9 8.3* 8.3*   Sodium 141 139 137   Potassium 5.3 4.7 4.5   Chloride 104 103 102   CO2 27 28 25        Recent Labs   Lab 05/04/21  1001 04/30/21  0412 04/29/21  1022   WBC 5.63 6.68 8.06   Hgb 9.7* 9.7* 10.8*   Hematocrit 33.1* 32.9* 35.9   MCV 96.2* 94.8 93.2   MCH 28.2 28.0 28.1   MCHC 29.3* 29.5* 30.1*   Platelets 87* 90* 107*         No results for input(s): PTT, PT, INR in the last 72 hours.      Microbiology:  Microbiology Results (last 15 days)       Procedure Component Value Units Date/Time    COVID-19 (SARS-CoV-2) only ([facility]) asymptomatic admission [[id number]] Collected: 04/23/21 1352    Order Status: Completed Specimen: Nasopharyngeal Updated: 04/23/21 1429     Purpose of COVID testing Screening     SARS-CoV-2 Specimen Source Nasal Swab     SARS CoV 2 Overall Result Not Detected     Comment: __________________________________________________  -A result of "Detected" indicates POSITIVE for the    presence of SARS CoV-2 RNA  -A result of "Not Detected" indicates NEGATIVE for the    presence of SARS CoV-2 RNA  __________________________________________________________  Test performed using the Roche cobas Liat SARS-CoV-2 assay. This assay is  only for use under the Food and Drug Administration's Emergency Use  Authorization. This is a real-time RT-PCR assay for the qualitative  detection of SARS-CoV-2 RNA. Viral nucleic acids may persist in vivo,  independent of viability.  Detection of viral nucleic acid does not imply the  presence of infectious virus, or that virus nucleic acid is the cause of  clinical symptoms.  Negative results do not preclude SARS-CoV-2 infection and  should not be used as the sole basis for diagnosis, treatment or other  patient management decisions. Negative results must be combined with  clinical observations, patient history, and/or epidemiological information.  Invalid results may be due to inhibiting substances in the specimen and  recollection should occur. Please see Fact Sheets for patients and providers  located:  https://www.benson-chung.com/         Narrative:      o Collect and clearly label specimen type:  o PREFERRED-Upper respiratory specimen: One Nasal Swab in  Transport Media.  o Hand deliver to laboratory ASAP  Indication for testing->Extended care facility admission to  semi private room  Screening                Signed by: Cindi Carbon, MD

## 2021-05-06 NOTE — Plan of Care (Addendum)
[name]. Report given to [place].    1530 Patient discharged to [facility] via stretcher accompanied by transport team with all the belongings pt came with. Discharge instructions and follow up plan explained to pt and she demonstrated understanding, no further questions at this time. IV line removed.    Latest V/S  Blood pressure 127/60, pulse 63, temperature 97.6 F (36.4 C), resp. rate 16, height 1.575 m (5\' 2" ), weight 95.4 kg (210 lb 5.1 oz), SpO2 93 %.         Problem: Moderate/High Fall Risk Score >5  Goal: Patient will remain free of falls  05/06/2021 1232 by Blanca Friend, RN  Outcome: Progressing  Flowsheets (Taken 05/06/2021 1149)  High (Greater than 13):   HIGH-Visual cue at entrance to patient's room   HIGH-Bed alarm on at all times while patient in bed   HIGH-Apply yellow "Fall Risk" arm band   HIGH-Initiate use of floor mats as appropriate   HIGH-Consider use of low bed  05/06/2021 1149 by Irys Nigh, RN  Outcome: Progressing  Flowsheets (Taken 05/06/2021 1149)  High (Greater than 13):   HIGH-Visual cue at entrance to patient's room   HIGH-Bed alarm on at all times while patient in bed   HIGH-Apply yellow "Fall Risk" arm band   HIGH-Initiate use of floor mats as appropriate   HIGH-Consider use of low bed  VH High Risk (Greater than 13): Use of floor mat     Problem: Fluid and Electrolyte Imbalance/ Endocrine  Goal: Fluid and electrolyte balance are achieved/maintained  05/06/2021 1232 by Maryjean Corpening, RN  Outcome: Progressing  Flowsheets (Taken 05/06/2021 1149)  Fluid and electrolyte balance are achieved/maintained:   Monitor intake and output every shift   Monitor/assess lab values and report abnormal values   Provide adequate hydration   Assess for confusion/personality changes   Assess and reassess fluid and electrolyte status   Monitor daily weight   Observe for cardiac arrhythmias   Monitor for muscle weakness  05/06/2021 1149 by Laronn Devonshire, RN  Outcome:  Progressing  Flowsheets (Taken 05/06/2021 1149)  Fluid and electrolyte balance are achieved/maintained:   Monitor intake and output every shift   Monitor/assess lab values and report abnormal values   Provide adequate hydration   Assess for confusion/personality changes   Assess and reassess fluid and electrolyte status   Monitor daily weight   Observe for cardiac arrhythmias   Monitor for muscle weakness  Goal: Adequate hydration  05/06/2021 1232 by Xandrea Clarey, RN  Outcome: Progressing  Flowsheets (Taken 05/06/2021 1149)  Adequate hydration:   Assess mucus membranes, skin color, turgor, perfusion and presence of edema   Monitor and assess vital signs and perfusion   Assess for peripheral, sacral, periorbital and abdominal edema  05/06/2021 1149 by Emerald Shor, RN  Outcome: Progressing  Flowsheets (Taken 05/06/2021 1149)  Adequate hydration:   Assess mucus membranes, skin color, turgor, perfusion and presence of edema   Monitor and assess vital signs and perfusion   Assess for peripheral, sacral, periorbital and abdominal edema     Problem: Nutrition  Goal: Nutritional intake is adequate  05/06/2021 1232 by Clary Boulais, RN  Outcome: Progressing  Flowsheets (Taken 05/06/2021 1232)  Nutritional intake is adequate:   Monitor daily weights   Assist patient with meals/food selection   Allow adequate time for meals   Encourage/perform oral hygiene as appropriate   Assess anorexia, appetite, and amount of meal/food tolerated  05/06/2021 1149 by Johnasia Liese,  RN  Outcome: Progressing  Flowsheets (Taken 05/06/2021 1149)  Nutritional intake is adequate:   Monitor daily weights   Assist patient with meals/food selection   Allow adequate time for meals   Encourage/perform oral hygiene as appropriate   Assess anorexia, appetite, and amount of meal/food tolerated  Goal: Patient maintains weight  05/06/2021 1232 by Lajuan Lines, Jamorion Gomillion, RN  Outcome: Progressing  05/06/2021 1149 by Maebel Marasco, RN  Outcome: Progressing  Goal: Food and/or nutrient  delivery  05/06/2021 1232 by Lajuan Lines, Kerryann Allaire, RN  Outcome: Progressing  05/06/2021 1149 by Yakelin Grenier, RN  Outcome: Progressing     Problem: Diabetes: Glucose Imbalance  Goal: Blood glucose stable at established goal  05/06/2021 1232 by Nidal Rivet, RN  Outcome: Progressing  Flowsheets (Taken 05/06/2021 1149)  Blood glucose stable at established goal:   Monitor lab values   Monitor intake and output.  Notify LIP if urine output is < 30 mL/hour.   Monitor/assess vital signs   Assess for hypoglycemia /hyperglycemia   Coordinate medication administration with meals, as indicated   Ensure adequate hydration   Ensure appropriate diet and assess tolerance   Follow fluid restrictions/IV/PO parameters  05/06/2021 1149 by Rai Severns, RN  Outcome: Progressing  Flowsheets (Taken 05/06/2021 1149)  Blood glucose stable at established goal:   Monitor lab values   Monitor intake and output.  Notify LIP if urine output is < 30 mL/hour.   Monitor/assess vital signs   Assess for hypoglycemia /hyperglycemia   Coordinate medication administration with meals, as indicated   Ensure adequate hydration   Ensure appropriate diet and assess tolerance   Follow fluid restrictions/IV/PO parameters     Problem: Potential for Aspiration  Goal: Risk of aspiration will be minimized  05/06/2021 1232 by Dilan Fullenwider, RN  Outcome: Progressing  Flowsheets (Taken 05/06/2021 1149)  Risk of aspiration will be minimized:   Dysphagia screen: Keep patient NPO if patient fails screening   Assess and monitor ability to swallow   Monitor/assess for signs of aspiration (tachypnea, cough, wheezing, clearing throat, hoarseness after eating, decrease in SaO2)   Place swallow precaution signage above bed   Supervise patient during oral intake  05/06/2021 1149 by Cleona Doubleday, RN  Outcome: Progressing  Flowsheets (Taken 05/06/2021 1149)  Risk of aspiration will be minimized:   Dysphagia screen: Keep patient NPO if patient fails screening   Assess and monitor ability to swallow    Monitor/assess for signs of aspiration (tachypnea, cough, wheezing, clearing throat, hoarseness after eating, decrease in SaO2)   Place swallow precaution signage above bed   Supervise patient during oral intake     Problem: Renal Instability  Goal: Nutritional intake is adequate  05/06/2021 1232 by Ariany Kesselman, RN  Outcome: Progressing  Flowsheets (Taken 05/06/2021 1232)  Nutritional intake is adequate:   Monitor daily weights   Assist patient with meals/food selection   Allow adequate time for meals   Encourage/perform oral hygiene as appropriate   Assess anorexia, appetite, and amount of meal/food tolerated  05/06/2021 1149 by Ketzaly Cardella, RN  Outcome: Progressing  Flowsheets (Taken 05/06/2021 1149)  Nutritional intake is adequate:   Monitor daily weights   Assist patient with meals/food selection   Allow adequate time for meals   Encourage/perform oral hygiene as appropriate   Assess anorexia, appetite, and amount of meal/food tolerated  Goal: Fluid and electrolyte balance are achieved/maintained  05/06/2021 1232 by Lajuan Lines, Eviana Sibilia, RN  Outcome: Progressing  Flowsheets (Taken 05/06/2021 1149)  Fluid and electrolyte balance  are achieved/maintained:   Monitor intake and output every shift   Monitor/assess lab values and report abnormal values   Provide adequate hydration   Monitor daily weight   Assess and reassess fluid and electrolyte status   Monitor for muscle weakness   Follow fluid restrictions/IV/PO parameters   Observe for cardiac arrhythmias   Assess for confusion/personality changes   Observe for seizure activity and initiate seizure precautions if indicated  05/06/2021 1149 by Kania Regnier, RN  Outcome: Progressing  Flowsheets (Taken 05/06/2021 1149)  Fluid and electrolyte balance are achieved/maintained:   Monitor intake and output every shift   Monitor/assess lab values and report abnormal values   Provide adequate hydration   Monitor daily weight   Assess and reassess fluid and electrolyte status   Monitor for  muscle weakness   Follow fluid restrictions/IV/PO parameters   Observe for cardiac arrhythmias   Assess for confusion/personality changes   Observe for seizure activity and initiate seizure precautions if indicated  Goal: Free from infection  Outcome: Progressing  Flowsheets (Taken 05/06/2021 1048 by Lendell Caprice, RN)  Free from infection: Monitor/assess for signs and symptoms of infection     Problem: Safety  Goal: Patient will be free from injury during hospitalization  Outcome: Progressing  Flowsheets (Taken 05/06/2021 1232)  Patient will be free from injury during hospitalization:   Assess patient's risk for falls and implement fall prevention plan of care per policy   Provide and maintain safe environment   Use appropriate transfer methods   Ensure appropriate safety devices are available at the bedside   Hourly rounding   Assess for patients risk for elopement and implement Elopement Risk Plan per policy  Goal: Patient will be free from infection during hospitalization  Outcome: Progressing  Flowsheets (Taken 05/06/2021 1232)  Free from Infection during hospitalization:   Assess and monitor for signs and symptoms of infection   Monitor lab/diagnostic results   Monitor all insertion sites (i.e. indwelling lines, tubes, urinary catheters, and drains)   Encourage patient and family to use good hand hygiene technique     Problem: Pain  Goal: Pain at adequate level as identified by patient  Outcome: Progressing  Flowsheets (Taken 05/06/2021 1232)  Pain at adequate level as identified by patient:   Identify patient comfort function goal   Assess for risk of opioid induced respiratory depression, including snoring/sleep apnea. Alert healthcare team of risk factors identified.   Assess pain on admission, during daily assessment and/or before any "as needed" intervention(s)   Reassess pain within 30-60 minutes of any procedure/intervention, per Pain Assessment, Intervention, Reassessment (AIR) Cycle   Evaluate if patient  comfort function goal is met   Evaluate patient's satisfaction with pain management progress   Include patient/patient care companion in decisions related to pain management as needed     Problem: Side Effects from Pain Analgesia  Goal: Patient will experience minimal side effects of analgesic therapy  Outcome: Progressing  Flowsheets (Taken 05/06/2021 1232)  Patient will experience minimal side effects of analgesic therapy:   Monitor/assess patient's respiratory status (RR depth, effort, breath sounds)   Assess for changes in cognitive function   Prevent/manage side effects per LIP orders (i.e. nausea, vomiting, pruritus, constipation, urinary retention, etc.)     Problem: Discharge Barriers  Goal: Patient will be discharged home or other facility with appropriate resources  Outcome: Progressing  Flowsheets (Taken 05/06/2021 1232)  Discharge to home or other facility with appropriate resources:   Provide appropriate patient education  Provide information on available health resources   Initiate discharge planning     Problem: Compromised Tissue integrity  Goal: Damaged tissue is healing and protected  Outcome: Progressing  Flowsheets (Taken 05/06/2021 1232)  Damaged tissue is healing and protected:   Monitor/assess Braden scale every shift   Reposition patient every 2 hours and as needed unless able to reposition self   Keep intact skin clean and dry   Use bath wipes, not soap and water, for daily bathing   Avoid shearing injuries   Relieve pressure to bony prominences for patients at moderate and high risk   Increase activity as tolerated/progressive mobility   Use incontinence wipes for cleaning urine, stool and caustic drainage. Foley care as needed   Monitor external devices/tubes for correct placement to prevent pressure, friction and shearing   Monitor patient's hygiene practices   Encourage use of lotion/moisturizer on skin  Goal: Nutritional status is improving  Outcome: Progressing  Flowsheets (Taken  05/06/2021 1232)  Nutritional status is improving:   Assist patient with eating   Allow adequate time for meals     Problem: Patient Receiving Advanced Renal Therapies  Goal: Therapy access site remains intact  Outcome: Progressing  Flowsheets (Taken 05/06/2021 1048 by Lendell Caprice, RN)  Therapy access site remains intact: Assess therapy access site

## 2021-05-06 NOTE — Progress Notes (Signed)
Pt used cpap  7 with2 liter 02 for 4 hors tolerate well Dossie Der rt

## 2021-05-06 NOTE — Progress Notes (Addendum)
DCP reviewed with pt and spouse. H&M to transport. Bedside nurse aware to call report. Pt and spouse agreeable with DCP.    UPDATE:  Patient currently in dialysis. Transport time changed to 3pm. Family and [place] aware.       05/06/21 5848   Discharge Disposition   Patient preference/choice provided? Yes   Physical Discharge Disposition SNF   Receiving facility, unit and room number: [facility], 311-A   Nursing report phone number: [phone]   Mode of Transportation Ambulance  (H&M)   Pick up time 10:30   Patient/Family/POA notified of transfer plan Yes   Patient agreeable to discharge plan/expected d/c date? Yes   Family/POA agreeable to discharge plan/expected d/c date? Yes   Bedside nurse notified of transport plan? Yes   CM Interventions   Follow up appointment scheduled? No   Reason no follow up scheduled? Discharge to SNF/AR/LTAC   Notified MD? Yes   Multidisciplinary rounds/family meeting before d/c? Yes   Medicare Checklist   Is this a Medicare patient? Yes     [facility]  [phone]

## 2021-05-06 NOTE — Progress Notes (Signed)
Pt is alert and oriented. Pt denied pain and sob. No other complain given. LAVF access is optimally working post dialysis.  Hemodialysis ended and pt. able to finish tx.  Able to removed Net Fluid of 3.6L. as ordered by Nephrologist.  Dr. Drema Pry seen the pt during her rounds.  Reports were given to Primary nurse.        05/06/21 1345   Treatment Summary   Time Off Machine 1330   Duration of Treatment (Hours) 3.5   Treatment Type 1:1   Dialyzer Clearance Moderately streaked   Fluid Volume Off (mL) 4000   Prime Volume (mL) 200   Rinseback Volume (mL) 200   Fluid Given: Normal Saline (mL) 0   Fluid Given: PRBC  0 mL   Fluid Given: Albumin (mL) 0   Fluid Given: Other (mL) 0   Total Fluid Given 400   Hemodialysis Net Fluid Removed 3600   Post Treatment Assessment   Post-Treatment Weight (Kg)   (-3.6L.)   Patient Response to Treatment fairly tolerated   Additional Dialyzer Used 0   Graft/Fistula Left   No placement date or time found.   Access Type: Arteriovenous Fistula  Orientation: Left  Graft/Fistula Location: Upper Arm   Fistula/ Graft Assessment Abnormalities WDL   Needle Size 16 Gauge   Cannulation Sites held Arterial (min) 8   Cannulation Sites held Venous (min) 8   Hemostasis Achieved Yes   Vitals   Temp 97.6 F (36.4 C)   Heart Rate 63   Resp Rate 16   BP 127/60   SpO2 93 %   O2 Device Nasal cannula   Assessment   Mental Status Alert;Oriented   Cardiac Regularity Regular   Cardiac Rhythm Normal Sinus Rhythm   Bilateral Breath Sounds Clear   R Breath Sounds Clear   L Breath Sounds Clear   Edema  WDL   RLE Edema +1   LLE Edema +1   General Skin Color Appropriate for ethnicity   Skin Condition/Temp Warm;Dry   Abdomen Inspection Soft   GI Symptoms None   Mobility Bed   Pain Assessment   Charting Type Reassessment   Pain Scale Used Numeric Scale (0-10)   Numeric Pain Scale   Pain Score 0   POSS Score 1   Education   Person taught Patient   Knowledge basis Substantial   Topics taught Fluid Management   Teaching  Tools Explain   Reponse Verbalizes Understanding   Bedside Nurse Communication   Name of bedside RN - post dialysis Hyerim Cho, RN.

## 2021-05-06 NOTE — Progress Notes (Signed)
[facility] PROGRESS NOTE  [id number]    Date Time: 05/06/21 12:52 PM  Patient Name: [name]  Attending Physician: Barbaraann Faster, MD    CC: follow-up ESRD  HD note     Assessment:     ESRD on HD MWF at [facility] - followed by Dr. Randel Pigg  - noncompliant with HD  Hyperkalemia - resolved  Mild hypocalcemia due to CKD  Anemia in CKD - lower today  HTN - controlled  DM type 2 - uncontrolled  Hyperphosphatemia: better  Secondary hyperparathyroidism: PTH 1579 on 3/23  Left breast swelling - seen by onc, ddx includes malignancy  Abdominal wall abscess MRSA on vancomycin until 5/5  Elevated BNP 18K     Recommendations:     HD today - euvolemic   Keep on HD - MWF   SNF placement likely discharge today       Case discussed with: pt, HD RN    Gaylyn Lambert, MD  [facility]  [id number] (office)    Subjective:     Seen on HD , appears euvolemic   Dialysis Treatment Data:  Blood flow rate: 300 mL/min  Dialysate flow rate: 700 mL/min  Ultrafiltration rate: 1220 mL/hr    Dialysate potassium: 2 mEq/L  Dialysate calcium: 2.5 mEq/L    Arterial pressure: -256 mmHg  Venous pressure: 182mmHg       Review of Systems:     No cp  No sob      Physical Exam:     Vitals:    05/06/21 1215 05/06/21 1230 05/06/21 1240 05/06/21 1245   BP: 129/61 110/53 129/60 135/63   Pulse: 60 61 62 61   Resp: 16 19 18 16    Temp:       TempSrc:       SpO2: 96% 96% 94% 94%   Weight:       Height:         Intake and Output Summary (Last 24 hours) at Date Time    Intake/Output Summary (Last 24 hours) at 05/06/2021 1252  Last data filed at 05/06/2021 0830  Gross per 24 hour   Intake 440 ml   Output --   Net 440 ml       General: awake, alert, oriented x 3, no acute distress  Cardiovascular: regular rate and rhythm, no murmurs, rubs or gallops  Lungs: clear to auscultation bilaterally, without wheezing, rhonchi, or rales  Abdomen: soft, non-tender, non-distended, normoactive bowel sounds  Extremities: +LE edema    Meds:       Scheduled Meds: PRN Meds:    amLODIPine, 10 mg, Oral, Daily  atorvastatin, 40 mg, Oral, Daily  carvedilol, 25 mg, Oral, Q12H Davis  dextrose, 50 g, Intravenous, Once  gabapentin, 100 mg, Oral, Q8H SCH  heparin (porcine), 5,000 Units, Subcutaneous, Q12H Welsh  insulin glargine, 40 Units, Subcutaneous, QHS  insulin lispro, 1-4 Units, Subcutaneous, QHS  insulin lispro, 1-8 Units, Subcutaneous, TID AC  insulin lispro, 8 Units, Subcutaneous, TID AC  lidocaine, 1 patch, Transdermal, Q24H  miconazole 2 % with zinc oxide, , Topical, BID  petrolatum, , Topical, Q12H  senna-docusate, 2 tablet, Oral, QHS  sevelamer, 800 mg, Oral, TID MEALS  vancomycin, , Intravenous, See Admin Instructions          Continuous Infusions:   acetaminophen, 650 mg, Q6H PRN   Or  acetaminophen, 650 mg, Q6H PRN  albumin human, 100 mL, PRN  albuterol-ipratropium, 3 mL, Q6H PRN  dextrose, 15  g of glucose, PRN   Or  dextrose, 12.5 g, PRN   Or  dextrose, 12.5 g, PRN   Or  glucagon (rDNA), 1 mg, PRN  guaiFENesin, 200 mg, Q4H PRN  hydrALAZINE, 10 mg, Q6H PRN  lactulose, 10 g, Daily PRN  magnesium sulfate, 1 g, PRN  melatonin, 3 mg, QHS PRN  naloxone, 0.2 mg, PRN  ondansetron, 4 mg, Q6H PRN   Or  ondansetron, 4 mg, Q6H PRN  oxyCODONE-acetaminophen, 1 tablet, Q4H PRN  polyethylene glycol, 17 g, Daily PRN  sodium chloride, 100 mL, Q1H PRN  sodium chloride, 250 mL, PRN          Labs:     Recent Labs   Lab 05/04/21  1001 04/30/21  0412   WBC 5.63 6.68   Hgb 9.7* 9.7*   Hematocrit 33.1* 32.9*   Platelets 87* 90*       Recent Labs   Lab 05/04/21  1001 04/30/21  0412   Sodium 141 139   Potassium 5.3 4.7   Chloride 104 103   CO2 27 28   BUN 46.0* 40.0*   Creatinine 4.5* 4.3*   Calcium 8.9 8.3*   Glucose 173* 264*   EGFR 11.8 12.5             Invalid input(s): LEUKOCYTESUR      Imaging personally reviewed, including: No results found.    Signed by: Gaylyn Lambert, MD

## 2021-05-06 NOTE — Progress Notes (Signed)
Arrived in room, Pt is contact isolation.  PPE done per protocol. Pt is alert and oriented. Pt denied pain and sob. Pt's LAVF access is optimally working.  Timeouts and safety checks done. Will closely monitor the pt.        05/06/21 0949   Bedside Nurse Communication   Name of bedside RN - pre dialysis Hyerim Cho, RN.   Treatment Initiation- With Dialysis Precautions   Time Out/Safety Check Completed Yes   Consent for HD signed for this hospitalization (Date) 02/04/21   Consent for HD signed for this hospitalization (Time) 1450   Blood Consent Verified N/A   Dialysis Precautions All Connections Secured;Saline Line Double Clamped;Venous Parameters Set;Arterial Parameters Set;Air Foam Detecctor Engaged   Dialysis Treatment Type Bedside;Routine   Special Considerations contact isolation   Is patient diabetic? Yes   RO/Hemodialysis Architectural technologist   Is Total Chlorine less than 0.1 ppm? Yes   Orignial Total Chlorine Testing Time 0915   At 4 Hour Total Chlorine Testing Time 1315   RO/Hemodialysis Facilities manager Number 1    Machine Serial Number [id number]   RO # 25   RO Serial # [id number]   pH 7.2   Pressure Test Verified Yes   Alarms Verified Passed   Machine Temperature 98.6 F (37 C)   Alarms Verified Yes   Na+ mEq (Machine) 138 mEq   Bicarb mEq (Machine) 35 mEq   Hemodialysis Conductivity (Machine) 13.8   Hemodialysis Conductivity (Meter) 13.8   Dialyzer Lot Number [id number]   Tubing Lot Number [id number]   RO Machine Log Completed Yes   Hepatitis Status   HBsAg (Antigen) Result Negative   HBsAg Date Drawn 04/13/21   HBsAg Repeat Draw Due Date 05/11/21   Dialysis Weight   Pre-Treatment Weight (Kg) 94.7   Scale Type ICU Bed Scale   Vitals   Temp 98 F (36.7 C)   Heart Rate 67   Resp Rate 18   BP 139/63   SpO2 92 %   O2 Device Nasal cannula   Assessment   Cardiac Regularity Regular   Cardiac Rhythm Normal Sinus Rhythm   Respiratory Pattern Regular   Bilateral Breath Sounds Clear;Diminished   RLE  Edema +1   LLE Edema +1   General Skin Color Appropriate for ethnicity   Skin Condition/Temp Warm;Dry   Abdomen Inspection Soft;Rounded   GI Symptoms None   Mobility Bed   Graft/Fistula Left   No placement date or time found.   Access Type: Arteriovenous Fistula  Orientation: Left  Graft/Fistula Location: Upper Arm   Fistula/ Graft Assessment Abnormalities WDL   Pain Assessment   Charting Type Assessment   Pain Scale Used Numeric Scale (0-10)   Numeric Pain Scale   Pain Score 0   POSS Score 1   Hemodialysis Comments   Pre-Hemodialysis Comments Timeout and safety checks done

## 2021-05-06 NOTE — Discharge Summary (Signed)
DISCHARGE SUMMARY    Date Time: 05/06/21 10:49 AM  Patient Name: [name]  Attending Physician: Barbaraann Faster, MD    Date of Admission:   04/23/2021    Date of Discharge:   05/06/2021    Reason for Admission:   Altered mental status [R41.82]    Discharge Dx:   Present on Admission:   Altered mental status  Reproducible chest pain , left breast swelling status post chest compression x1.   Acute metabolic encephalopathy , s/p  RRT( chest compression x 1 ) , found with hypoxia, head CT negative.  Left breast swelling rib cage x-ray negative, ultrasound with inflammatory changes , Breast Swelling Improving   Status post mechanical fall , slipped in bathroom injury to lower lip, chin. Swelling resolved .  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  Hyperkalemia.  End-stage renal disease-HD on MWF , Echo LVEF 55 to 97%, grade 2 diastolic dysfunction  Right great toe total nail avulsion, wound.  Nail removed by podiatry  PAD PVR infrainguinal disease bilateral ,Seen by IR  No Intervention indicated if right helix wound does not heal we will consider angiogram by interventional radiology.  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  Obstructive sleep apnea  Morbid obesity     Consultations:   Treatment Team:   Attending Provider: Barbaraann Faster, MD  Consulting Physician: Armando Reichert, MD  Consulting Physician: Quillian Quince, MD  Consulting Physician: Ladona Horns, DPM  Consulting Physician: Inda Castle, MD  Consulting Physician: Cindi Carbon, MD  Consulting Physician: Myrene Buddy, MD      Procedures performed:   Hemodialysis     Discharge Medications:        Discharge Medication List        Taking      * acetaminophen  325 MG tablet  Dose: 650 mg  What changed: You were already taking a medication with the same name, and this prescription was added. Make sure you understand how and when to take each.  Commonly known as: TYLENOL  Take 2 tablets (650 mg) by mouth every 6 (six) hours as needed for Fever     * acetaminophen 500 MG tablet  Dose: 500 mg  What changed: Another medication with the same name was added. Make sure you understand how and when to take each.  Commonly known as: TYLENOL  Take 500 mg by mouth every 6 (six) hours as needed for Pain     albuterol sulfate HFA 108 (90 Base) MCG/ACT inhaler  Dose: 2 puff  Commonly known as: PROVENTIL  Inhale 2 puffs into the lungs every 6 (six) hours as needed for Wheezing or Shortness of Breath     albuterol-ipratropium 2.5-0.5(3) mg/3 mL nebulizer  Dose: 3 mL  Commonly known as: DUO-NEB  Take 3 mLs by nebulization every 8 (eight) hours     amLODIPine 10 MG tablet  Dose: 1 tablet  Commonly known as: NORVASC  Take 1 tablet by mouth daily     aspirin 81 MG chewable tablet  Dose: 81 mg  Chew 81 mg by mouth daily     atorvastatin 40 MG tablet  Dose: 40 mg  Commonly known as: LIPITOR  Take  1 tablet (40 mg) by mouth daily     carvedilol 25 MG tablet  Dose: 25 mg  Commonly known as: COREG  Take 1 tablet (25 mg) by mouth every 12 (twelve) hours     dextromethorphan-guaiFENesin 30-600 MG per 12 hr tablet  Dose: 1 tablet  Commonly known as: MUCINEX DM  Take 1 tablet by mouth every 12 (twelve) hours     gabapentin 100 MG capsule  Dose: 100 mg  Commonly known as: NEURONTIN  Take 1 capsule (100 mg) by mouth every 8 (eight) hours     guaiFENesin 100 MG/5ML oral liquid  Dose: 200 mg  Commonly known as: ROBITUSSIN  Take 10 mLs (200 mg) by mouth every 4 (four) hours as needed for Cough or Congestion     insulin glargine 100 UNIT/ML injection  Dose: 40 Units  Commonly known as: LANTUS  Inject 40 Units into the skin nightly     * insulin lispro 100 UNIT/ML injection  Dose: 1-8 Units  Inject 1-8 Units  into the skin 3 (three) times daily before meals Medium dose correction insulin. Add to prandial insulin dose if ordered. Give even if NPO.  If BG greater than 180 mg/dL add the following:  For BG 181 - 200 mg/dL give additional 1 unit  For BG 201 - 250 mg/dL give additional 3 units  For BG 251 - 300 mg/dL give additional 5 units  For BG 301 - 350 mg/dL give additional 7 units  For BG greater than 350 mg/dL give additional 8 units     * insulin lispro 100 UNIT/ML injection  Dose: 1-4 Units  Inject 1-4 Units into the skin nightly Medium dose correction insulin. Give even if NPO.  If BG greater than 180 mg/dL add the following:  For BG 181 - 200 mg/dL give 1 unit  For BG 201 - 250 mg/dL give 2 units  For BG 251 - 300 mg/dL give 3 units  For BG 301 - 350 mg/dL give 4 units  For BG greater than 350 mg/dL give 4 units     * insulin lispro 100 UNIT/ML injection  Dose: 8 Units  Inject 8 Units into the skin 3 (three) times daily before meals     lactobacillus/streptococcus Caps  Dose: 1 capsule  Take 1 capsule by mouth daily     lidocaine 5 %  Dose: 1 patch  Commonly known as: LIDODERM  Place 1 patch onto the skin every 24 hours Remove & Discard patch within 12 hours or as directed by MD     mupirocin 2 % ointment  Commonly known as: BACTROBAN  Apply topically daily USe BID for 2 Weeks     oxyCODONE-acetaminophen 5-325 MG per tablet  Dose: 1 tablet  Commonly known as: PERCOCET  Take 1 tablet by mouth every 4 (four) hours as needed for Pain     pantoprazole 40 MG tablet  Dose: 40 mg  Commonly known as: PROTONIX  Take 40 mg by mouth daily     petrolatum ointment  Apply topically every 12 (twelve) hours     polyethylene glycol 17 g packet  Dose: 17 g  Commonly known as: MIRALAX  Take 17 g by mouth daily as needed (Constipation)     senna-docusate 8.6-50 MG per tablet  Dose: 2 tablet  Commonly known as: PERICOLACE  Take 2 tablets by mouth nightly     * sevelamer 800 MG tablet  Dose: 800 mg  What changed: Another medication  with  the same name was added. Make sure you understand how and when to take each.  Commonly known as: RENVELA  Take 800 mg by mouth 2 (two) times daily with meals     * sevelamer 800 MG tablet  Dose: 800 mg  What changed: You were already taking a medication with the same name, and this prescription was added. Make sure you understand how and when to take each.  Commonly known as: RENVELA  Take 1 tablet (800 mg) by mouth 3 (three) times daily with meals           * This list has 7 medication(s) that are the same as other medications prescribed for you. Read the directions carefully, and ask your doctor or other care provider to review them with you.                STOP taking these medications      insulin aspart 100 UNIT/ML injection  Commonly known as: [facility] Course:   Details of admission and consultations on record.  OLGA BOURBEAU is a 66 y.o. female history of COPD, type 2 diabetes mellitus, hypertension, hyperlipidemia, COPD, ESRD-DD MWF presented to the emergency room after a fall , patient stated that she had lost her balance she was complaining of left chest pain left breast was swollen she also was complaining of right foot pain right great toe denies change in visual acuity evulsed patient was in shower and slipped no cardiac no neurological prodromal symptoms.  She was admitted to hospital for short period patient was not responding she had metabolic encephalopathy RRT was called patient got 1 chest compression and then she woke up she was found with hypoxia head CT was negative.  Patient was admitted to hospital for further management.  Hospital course:  She has been complaining of chest pain on the left side left breast was swollen tender and had injury from fall, patient was seen by cardiology in consultation given chest pain being reproducible medical management was continued.  Pain is Better With meds Now , Also breast swelling Better .,  Reference to her  breast patient was treated with antibiotic for MRSA infection already she was started on Rocephin currently she is on vancomycin until May 27, 2021 for MRSA infection and abdominal wall.  Patient was consulted by infectious diseases, oncology regarding left breast swelling and possibility of inflammatory breast cancer, ultrasound was obtained which shows changes of inflammation however with progressive dialysis and antibiotic patient's breast swelling is much better.  I spoke to Dr. Foye Clock Breast surgeon   patient will be followed by Dr. Foye Clock  as outpatient for further management.  Rib cage x-ray was negative for fracture.  Patient had acute metabolic encephalopathy-resolved she is back to her baseline.  Her fall was related to slipping in the bathroom-mechanical she injured her lip and chin both places are healing.  She was recently diagnosed with abdominal wall abscess with MRSA at PD catheter site patient is on dialysis and is receiving vancomycin per dialysis center May 27, 2021.  She had hyperkalemia in the setting of underlying end-stage renal disease patient had been receiving dialysis per schedule.  Potassium level is stable.  She had right great toenail avulsion and wound podiatry was consulted, nail was removed she arterial Doppler which shows PAD/PVR infrainguinal disease bilaterally, patient was seen by  interventional radiology no intervention is suggested at this point if she does not heal on her right great toe she should get angiogram for further evaluation and management with interventional radiology.  Patient has pulmonary nodules bilaterally , advised to follow-up with pulmonary medicine.  She has history of COPD, chronic respiratory failure uses oxygen as needed at home.  She is diabetic hypertensive has history of thrombocytopenia likely ITP, has been seen by hematology recommend follow-up with hematology after discharge.  Patient has secondary hyperparathyroidism from renal disease.  Also has  obstructive sleep apnea needs to use CPAP .  Patient has generalized debility, was seen by occupational and physical therapy, is is recommended to go to skilled nursing facility patient is agreeable Case management assisting for discharge planning.  Further hospital course per daily notes.  DCP to SNF per arrangements , d/w Pt and Case manager .  Called husband, message left.  Discharge planning per arrangement.  Patient had been waiting for discharge planning meanwhile she was treated for above medical conditions.  Her breast pain is much better lidocaine was added into treatment.  Case management is working for discharge planning patient will be discharged per arrangements.  Further hospital course per daily Notes .    Laboratory Data     CBC  Recent Labs   Lab 05/04/21  1001 04/30/21  0412   WBC 5.63 6.68   Hgb 9.7* 9.7*   Hematocrit 33.1* 32.9*   Platelets 87* 90*   MCV 96.2* 94.8       CMP  Recent Labs   Lab 05/04/21  1001 04/30/21  0412   Sodium 141 139   Potassium 5.3 4.7   Chloride 104 103   CO2 27 28   BUN 46.0* 40.0*   Creatinine 4.5* 4.3*   Glucose 173* 264*   Calcium 8.9 8.3*       Lipid panel              Lab Results   Component Value Date    TSH 0.97 02/05/2021       Cardiac enzymes                Culture:   Microbiology Results (last 15 days)       Procedure Component Value Units Date/Time    COVID-19 (SARS-CoV-2) only ([facility]) asymptomatic admission [[id number]] Collected: 04/23/21 1352    Order Status: Completed Specimen: Nasopharyngeal Updated: 04/23/21 1429     Purpose of COVID testing Screening     SARS-CoV-2 Specimen Source Nasal Swab     SARS CoV 2 Overall Result Not Detected     Comment: __________________________________________________  -A result of "Detected" indicates POSITIVE for the    presence of SARS CoV-2 RNA  -A result of "Not Detected" indicates NEGATIVE for the    presence of SARS CoV-2 RNA  __________________________________________________________  Test performed using the  Roche cobas [facility] SARS-CoV-2 assay. This assay is  only for use under the Food and Drug Administration's Emergency Use  Authorization. This is a real-time RT-PCR assay for the qualitative  detection of SARS-CoV-2 RNA. Viral nucleic acids may persist in vivo,  independent of viability. Detection of viral nucleic acid does not imply the  presence of infectious virus, or that virus nucleic acid is the cause of  clinical symptoms. Negative results do not preclude SARS-CoV-2 infection and  should not be used as the sole basis for diagnosis, treatment or other  patient management decisions. Negative results must be combined  with  clinical observations, patient history, and/or epidemiological information.  Invalid results may be due to inhibiting substances in the specimen and  recollection should occur. Please see Fact Sheets for patients and providers  located:  https://www.benson-chung.com/         Narrative:      o Collect and clearly label specimen type:  o PREFERRED-Upper respiratory specimen: One Nasal Swab in  Transport Media.  o Hand deliver to laboratory ASAP  Indication for testing->Extended care facility admission to  semi private room  Screening            All radiology result for current encounter:  No results found.    Echo Results       None              Physical Exam:   BP 143/67   Pulse 65   Temp 98 F (36.7 C)   Resp 14   Ht 1.575 m (5\' 2" )   Wt 95.4 kg (210 lb 5.1 oz)   LMP  (LMP Unknown) Comment: post menopausal  SpO2 95%   BMI 38.47 kg/m     General appearance - alert, and in no distress  Obese BMI 42  HEENT: Normocephalic,atraumatic, pupil equal  Neck - supple  Chest - clear to auscultation  Heart - normal rate and regular rhythm  Abdomen - bowel sounds normal, soft, non distended  Left breast Swelling improved   Extremities - +ve Left Breast edema , right great toe nail Off   Neurological - Alert , No Gross Focal deficits     Discharge condition:   Stable     Discharge   Diet :   Cardiac     Discharge Instructions:   Follow up:     Philipp Ovens, [place]  [place] [address]  [phone]    Schedule an appointment as soon as possible for a visit      Horton Marshall, [address]  [place] [address]  [phone]          Cindi Carbon, [address]  [place]  [place] [address]  [phone]    Follow up      Sena Slate, MD  [address]  [place] [address]  [phone]    Follow up  COPD , Lung Nodule, Need followup chest image in 3-6 Months    Sherri Rad, [address]  Cardi[facility] and Interventional Radiology  [place] [address]  [phone]    Follow up  for PAD    Cindi Carbon, [address]  [place]  [address]  [phone]    Schedule an appointment as soon as possible for a visit          Barbaraann Faster, MD  05/06/2021  10:49 AM  Time spent 45  minutes

## 2021-05-06 NOTE — Plan of Care (Signed)
Problem: Renal Instability  Goal: Fluid and electrolyte balance are achieved/maintained  Outcome: Progressing  Flowsheets (Taken 05/06/2021 1048)  Fluid and electrolyte balance are achieved/maintained:   Monitor/assess lab values and report abnormal values   Assess for confusion/personality changes   Monitor daily weight   Observe for cardiac arrhythmias  Goal: Free from infection  Outcome: Progressing  Flowsheets (Taken 05/06/2021 1048)  Free from infection: Monitor/assess for signs and symptoms of infection     Problem: Patient Receiving Advanced Renal Therapies  Goal: Therapy access site remains intact  Outcome: Progressing  Flowsheets (Taken 05/06/2021 1048)  Therapy access site remains intact: Assess therapy access site

## 2021-05-10 ENCOUNTER — Emergency Department
Admission: EM | Admit: 2021-05-10 | Discharge: 2021-05-11 | Disposition: A | Discharge status: Home / Self Care | Payer: 59 | Attending: Emergency Medicine | Admitting: Emergency Medicine

## 2021-05-10 ENCOUNTER — Emergency Department: Payer: 59

## 2021-05-10 ENCOUNTER — Telehealth: Payer: Self-pay

## 2021-05-10 DIAGNOSIS — Z992 Dependence on renal dialysis: Secondary | ICD-10-CM | POA: Insufficient documentation

## 2021-05-10 DIAGNOSIS — N179 Acute kidney failure, unspecified: Secondary | ICD-10-CM | POA: Insufficient documentation

## 2021-05-10 DIAGNOSIS — R0789 Other chest pain: Secondary | ICD-10-CM | POA: Insufficient documentation

## 2021-05-10 LAB — CBC AND DIFFERENTIAL
Absolute NRBC: 0 10*3/uL (ref 0.00–0.00)
Basophils Absolute Automated: 0.05 10*3/uL (ref 0.00–0.08)
Basophils Automated: 0.8 %
Eosinophils Absolute Automated: 0.21 10*3/uL (ref 0.00–0.44)
Eosinophils Automated: 3.3 %
Hematocrit: 36.2 % (ref 34.7–43.7)
Hgb: 11.2 g/dL — ABNORMAL LOW (ref 11.4–14.8)
Immature Granulocytes Absolute: 0.02 10*3/uL (ref 0.00–0.07)
Immature Granulocytes: 0.3 %
Instrument Absolute Neutrophil Count: 4.45 10*3/uL (ref 1.10–6.33)
Lymphocytes Absolute Automated: 0.88 10*3/uL (ref 0.42–3.22)
Lymphocytes Automated: 14 %
MCH: 28.5 pg (ref 25.1–33.5)
MCHC: 30.9 g/dL — ABNORMAL LOW (ref 31.5–35.8)
MCV: 92.1 fL (ref 78.0–96.0)
MPV: 12.7 fL — ABNORMAL HIGH (ref 8.9–12.5)
Monocytes Absolute Automated: 0.66 10*3/uL (ref 0.21–0.85)
Monocytes: 10.5 %
Neutrophils Absolute: 4.45 10*3/uL (ref 1.10–6.33)
Neutrophils: 71.1 %
Nucleated RBC: 0 /100 WBC (ref 0.0–0.0)
Platelets: 101 10*3/uL — ABNORMAL LOW (ref 142–346)
RBC: 3.93 10*6/uL (ref 3.90–5.10)
RDW: 14 % (ref 11–15)
WBC: 6.27 10*3/uL (ref 3.10–9.50)

## 2021-05-10 LAB — BASIC METABOLIC PANEL
Anion Gap: 11 (ref 5.0–15.0)
BUN: 33 mg/dL — ABNORMAL HIGH (ref 7.0–21.0)
CO2: 26 mEq/L (ref 17–29)
Calcium: 8.9 mg/dL (ref 8.5–10.5)
Chloride: 100 mEq/L (ref 99–111)
Creatinine: 4.4 mg/dL — ABNORMAL HIGH (ref 0.4–1.0)
Glucose: 228 mg/dL — ABNORMAL HIGH (ref 70–100)
Potassium: 5.3 mEq/L (ref 3.5–5.3)
Sodium: 137 mEq/L (ref 135–145)

## 2021-05-10 LAB — HIGH SENSITIVITY TROPONIN-I: hs Troponin-I: 62.1 ng/L — AB

## 2021-05-10 LAB — GFR: EGFR: 12.1

## 2021-05-10 MED ORDER — LABETALOL HCL 5 MG/ML IV SOLN (WRAP)
10.0000 mg | Freq: Once | INTRAVENOUS | Status: DC
Start: 2021-05-10 — End: 2021-05-11

## 2021-05-10 NOTE — Discharge Instructions (Signed)
Dear [name]:    Thank you for choosing the [facility] Emergency Department, the premier emergency department in the [place] area.  I hope your visit today was EXCELLENT. You will receive a survey via text message that will give you the opportunity to provide feedback to your team about your visit. Please do not hesitate to reach out with any questions!    Specific instructions for your visit today:        IF YOU DO NOT CONTINUE TO IMPROVE OR YOUR CONDITION WORSENS, PLEASE CONTACT YOUR DOCTOR OR RETURN IMMEDIATELY TO THE EMERGENCY DEPARTMENT.    Sincerely,  Cristal Deer, MD  Attending Emergency Physician  [facility] Emergency Department      OBTAINING A PRIMARY CARE APPOINTMENT    Primary care physicians (PCPs, also known as primary care doctors) are either internists or family medicine doctors. Both types of PCPs focus on health promotion, disease prevention, patient education and counseling, and treatment of acute and chronic medical conditions.    If you need a primary care doctor, please call the below number and ask who is receiving new patients.     [facility]  Telephone:  [phone]  [email or web address]    DOCTOR REFERRALS  Call [phone] (available 24 hours a day, 7 days a week) if you need any further referrals and we can help you find a primary care doctor or specialist.  Also, available online at:  [email or web address]    YOUR CONTACT INFORMATION  Before leaving please check with registration to make sure we have an up-to-date contact number.  You can call registration at [phone] to update your information.  For questions about your hospital bill, please call [phone].  For questions about your Emergency Dept Physician bill please call [phone].      [place]  If you need help with health or social services, please call 2-1-1 for a free referral to resources in your area.  2-1-1 is a free service connecting people  with information on health insurance, free clinics, pregnancy, mental health, dental care, food assistance, housing, and substance abuse counseling.  Also, available online at:  http://www.[facility].org    ORTHOPEDIC INJURY   Please know that significant injuries can exist even when an initial x-ray is read as normal or negative.  This can occur because some fractures (broken bones) are not initially visible on x-rays.  For this reason, close outpatient follow-up with your primary care doctor or bone specialist (orthopedist) is required.    MEDICATIONS AND FOLLOWUP  Please be aware that some prescription medications can cause drowsiness.  Use caution when driving or operating machinery.    The examination and treatment you have received in our Emergency Department is provided on an emergency basis, and is not intended to be a substitute for your primary care physician.  It is important that your doctor checks you again and that you report any new or remaining problems at that time.      ASSISTANCE WITH INSURANCE    Affordable Care Act  [facility])  Call to start or finish an application, compare plans, enroll or ask a question.  [place]: [phone]  Web:  Healthcare.gov    Help Enrolling in [place]  [phone] (TOLL-FREE)  [phone] (TTY)  Web:  Http://www.coverva.org    Local Help Enrolling in the [place]  ([phone] [phone] (MAIN)  Email:  health-help@nvfs .org  Web:  http://lewis-perez.info/  Address:  [address], [address] [place],  [address]    SEDATING MEDICATIONS  Sedating medications include strong pain medications (e.g. narcotics), muscle relaxers, benzodiazepines (used for anxiety and as muscle relaxers), Benadryl/diphenhydramine and other antihistamines for allergic reactions/itching, and other medications.  If you are unsure if you have received a sedating medication, please ask your physician or nurse.  If you received a sedating  medication: DO NOT drive a car. DO NOT operate machinery. DO NOT perform jobs where you need to be alert.  DO NOT drink alcoholic beverages while taking this medicine.     If you get dizzy, sit or lie down at the first signs. Be careful going up and down stairs.  Be extra careful to prevent falls.     Never give this medicine to others.     Keep this medicine out of reach of children.     Do not take or save old medicines. Throw them away when outdated.     Keep all medicines in a cool, dry place. DO NOT keep them in your bathroom medicine cabinet or in a cabinet above the stove.    MEDICATION REFILLS  Please be aware that we cannot refill any prescriptions through the ER. If you need further treatment from what is provided at your ER visit, please follow up with your primary care doctor or your pain management specialist.    FREESTANDING EMERGENCY DEPARTMENTS OF [facility]  Did you know [facility] has two freestanding ERs located just a few miles away?  [facility] [place] and [facility] [place] have short wait times, easy free parking directly in front of the building and top patient satisfaction scores - and the same Board Certified Emergency Medicine doctors as  [facility].

## 2021-05-10 NOTE — EDIE (Signed)
COLLECTIVE?NOTIFICATION?05/10/2021 14:07?ABUK, SELLECK L?MRN: [id number]    Criteria Met      5 ED Visits in 12 Months    Security and Safety  No Security Events were found.  ED Care Guidelines  There are currently no ED Care Guidelines for this patient. Please check your facility's medical records system.    Flags      Negative COVID-19 Lab Result - [facility] - A specimen collected from this patient was negative for COVID-19 / Attributed By: [place] [facility] / Attributed On: 04/15/2021       Prescription Monitoring Program  280??- Narcotic Use Score  240??- Sedative Use Score  000??- Stimulant Use Score  130??- Overdose Risk Score  - All Scores range from 000-999 with 75% of the population scoring < 200 and on 1% scoring above 650  - The last digit of the narcotic, sedative, and stimulant score indicates the number of active prescriptions of that type  - Higher Use scores correlate with increased prescribers, pharmacies, mg equiv, and overlapping prescriptions  - Higher Overdose Risk Scores correlate with increased risk of unintentional overdose death   Concerning or unexpectedly high scores should prompt a review of the PMP record; this does not constitute checking PMP for prescribing purposes.    E.D. Visit Count (12 mo.)  Facility Visits   [facility] 1   Total 8   Note: Visits indicate total known visits.     Recent Emergency Department Visit Summary  Date Facility [facility] Type Diagnoses or Chief Complaint    May 10, 2021  [place]  [place]  [place]  Emergency      M 408: CP      Apr 23, 2021  [place].  [place]  [place]  Emergency      confused      Altered Mental Status      Apr 13, 2021  [place].  [place]  [place]  Emergency      Abdominal Pain      Fluid overload, unspecified      Mar 03, 2021  [place].  [place]  [place]  Emergency      SOB x 1 week      Shortness of Breath      Hyperglycemia      Hypertension      Patient's  noncompliance with other medical treatment and regimen due to unspecified reason      Shortness of breath      Other specified abnormalities of plasma proteins      Hyperkalemia      Dependence on renal dialysis      End stage renal disease      Feb 04, 2021  [place].  [place]  [place]  Emergency      SOB      Difficulty breathing      Cough      Hypertensive emergency      Jan 05, 2021  [place]  [place]  [place]  Emergency      Loss of appetite      Shortness of Breath      Diarrhea      Fluid overload, unspecified      Nov 12, 2020  [place].  [place]  [place]  Emergency      "flu-like"sx,N,V,D x 3 days  Missed Dialysis      Flu like symptoms      End stage renal disease      Oct 20, 2020  [place].  [place]  [place]  Emergency      hyperglycemia      Fall      Wrist Pain      Hip Pain      Shortness of breath        Recent Inpatient Visit Summary  Date [place] Type Diagnoses or Chief Complaint    Apr 13, 2021  [place]  [place]  [place]  Medical Surgical      Fluid overload, unspecified      Jan 05, 2021  [place]  [place]  [place]  Medical Surgical      Fluid overload, unspecified      Nov 12, 2020  [place].  [place]  [place]  Medical Surgical      End stage renal disease      Hyperkalemia      Myalgia, unspecified site      Diarrhea, unspecified      Vomiting, unspecified      Oct 20, 2020  [place].  [place]  [place]  Medical Surgical      Shortness of breath      End stage renal disease      Hypoxemia        Care Team  Provider Specialty Phone Fax Service Dates   Hazeline Junker Case Manager/Care Coordinator [phone]  Current    Theone Murdoch, DNP Nurse Practitioner: Family [phone] Current    PASTOR, ANTONIO, M.D. Internal Medicine: Infectious Disease [phone]  Current    PHAM, TRI M, MD Internal Medicine   Current    Louanne Belton MD S, MD Internal Medicine: Nephrology [phone]  Current    STORER,  Durwin Nora MD Jeneen Rinks, MD Psychiatry and Neurology: Geriatric Psychiatry [phone]  Current      Collective Portal  This patient has registered at the [facility] Emergency Department   For more information visit: [email or web address]     PLEASE NOTE:     1.   Any care recommendations and other clinical information are provided as guidelines or for historical purposes only, and providers should exercise their own clinical judgment when providing care.    2.   You may only use this information for purposes of treatment, payment or health care operations activities, and subject to the limitations of applicable [employer] Polic[employer].    3.   You should consult directly with the organization that provided a care guideline or other clinical history with any questions about additional information or accuracy or completeness of information provided.    ? 2023 [employer] - [email or web address].com/

## 2021-05-10 NOTE — ED Provider Notes (Signed)
[facility] EMERGENCY DEPARTMENT H&P         CLINICAL SUMMARY          Diagnosis:    .     Final diagnoses:   Musculoskeletal chest pain         MDM Notes:      Medical Decision Making  Patient with extensive past medical history including end-stage renal disease presenting with continued left-sided rib pain.  Patient had previous x-rays that showed no fracture.  X-ray showed no pneumothorax.  Pain improved with pain medicine at bedside.  Patient hemodynamically stable discharged back to her rehab facility.    Amount and/or Complexity of Data Reviewed  Radiology: ordered.        Disposition:      Discharge         Discharge Prescriptions    None                   CLINICAL INFORMATION        HPI:      Chief Complaint: Chest wall pain and Hypertension  .    [name] is a 66 y.o. female with PMHx of anemia, ESRD on dialysis, T2DM, GERD, HTN, HLD, COPD, CVA, upper GI bleed, T2DM, and gastritis, c/o acute onset of moderate constant 9/10 left sided cp since pta. Pt reports that she was at her dialysis center with 50 minutes left for dialysis when she developed cp. She notes that she had a fall 2 weeks ago and fell onto her left ribs, she was not medically evaluated at that time. Denies any neck pain, back pain, and blood thinner use.       History obtained from: Patient        ROS:      Positive and negative ROS elements as per HPI.  All other systems reviewed and negative.        Physical Exam:      Pulse 79  BP (!) 225/93  Resp 18  SpO2 98 %  Temp 98.2 F (36.8 C)      Physical Exam  Constitutional:       General: She is not in acute distress.     Appearance: She is obese. She is not ill-appearing or toxic-appearing.      Comments: Morbidly obese   HENT:      Head: Normocephalic and atraumatic.   Eyes:      Conjunctiva/sclera: Conjunctivae normal.      Pupils: Pupils are equal, round, and reactive to light.   Neck:      Trachea: Trachea normal.   Cardiovascular:      Rate  and Rhythm: Normal rate and regular rhythm.      Pulses: Normal pulses.      Heart sounds: Normal heart sounds.   Pulmonary:      Effort: Pulmonary effort is normal. No respiratory distress.      Breath sounds: Normal breath sounds.   Chest:      Chest wall: No tenderness.   Abdominal:      General: Bowel sounds are normal.      Palpations: Abdomen is soft.      Tenderness: There is no abdominal tenderness. There is no rebound.   Musculoskeletal:         General: Normal range of motion.      Cervical back: Normal range of motion and neck supple.      Comments: AV fistula LUE. Point tenderness left ribs  Skin:     General: Skin is warm and dry.   Neurological:      Mental Status: She is alert and oriented to person, place, and time.      GCS: GCS eye subscore is 4. GCS verbal subscore is 5. GCS motor subscore is 6.   Psychiatric:         Speech: Speech normal.         Judgment: Judgment normal.                     PAST HISTORY        Primary Care Provider: Horton Marshall, MD        PMH/PSH:    .     Past Medical History:   Diagnosis Date    Chronic obstructive pulmonary disease     Diabetes mellitus     Hyperlipidemia     Hypertension     Sleep apnea 2018       She has a past surgical history that includes [place] surgery (Left); Joint replacement; Joint replacement; EGD, BIOPSY (N/A, 05/14/2019); Tunneled Cath Placement (Permcath) (Bilateral, 05/23/2019); REMOVAL, FOREIGN BODY (N/A, 05/27/2019); EXPLORATORY LAPAROTOMY (N/A, 05/27/2019); Tunneled Cath Check/Change (Permcath) (N/A, 07/03/2019); and Tunneled Cath Placement (Permcath) (Right, 07/31/2019).        Social/Family History:      She reports that she has never smoked. She has never used smokeless tobacco. She reports that she does not drink alcohol and does not use drugs.    Family History   Problem Relation Age of Onset    Diabetes Mother     Hypertension Mother     Diabetes Father     Hypertension Father     Diabetes Sister     Hypertension Sister     Diabetes Brother      Hypertension Brother     Diabetes Paternal Aunt     Diabetes Paternal Uncle            Listed Medications on Arrival:    .     Discharge Medication List as of 05/10/2021  4:41 PM        CONTINUE these medications which have NOT CHANGED    Details   !! acetaminophen (TYLENOL) 325 MG tablet Take 2 tablets (650 mg) by mouth every 6 (six) hours as needed for Fever, Starting Tue 05/03/2021, E-Rx      !! acetaminophen (TYLENOL) 500 MG tablet Take 500 mg by mouth every 6 (six) hours as needed for Pain, Historical Med      albuterol sulfate HFA (PROVENTIL) 108 (90 Base) MCG/ACT inhaler Inhale 2 puffs into the lungs every 6 (six) hours as needed for Wheezing or Shortness of Breath, Historical Med      albuterol-ipratropium (DUO-NEB) 2.5-0.5(3) mg/3 mL nebulizer Take 3 mLs by nebulization every 8 (eight) hours, Starting Mon 05/02/2021, E-Rx      amLODIPine (NORVASC) 10 MG tablet Take 1 tablet by mouth daily, Historical Med      aspirin 81 MG chewable tablet Chew 81 mg by mouth daily, Historical Med      atorvastatin (LIPITOR) 40 MG tablet Take 1 tablet (40 mg) by mouth daily, Starting Tue 04/19/2021, E-Rx      carvedilol (COREG) 25 MG tablet Take 1 tablet (25 mg) by mouth every 12 (twelve) hours, Starting Tue 04/19/2021, E-Rx      dextromethorphan-guaiFENesin (MUCINEX DM) 30-600 MG per 12 hr tablet Take 1 tablet by mouth every 12 (twelve) hours, Starting  Tue 02/08/2021, E-Rx      gabapentin (NEURONTIN) 100 MG capsule Take 1 capsule (100 mg) by mouth every 8 (eight) hours, Starting Mon 05/02/2021, Print      guaiFENesin (ROBITUSSIN) 100 MG/5ML oral liquid Take 10 mLs (200 mg) by mouth every 4 (four) hours as needed for Cough or Congestion, Starting Mon 05/02/2021, E-Rx      insulin glargine (LANTUS) 100 UNIT/ML injection Inject 40 Units into the skin nightly, Starting Tue 05/03/2021, No Print      !! insulin lispro 100 UNIT/ML injection Inject 1-8 Units into the skin 3 (three) times daily before meals Medium dose correction  insulin. Add to prandial insulin dose if ordered. Give even if NPO.  If BG greater than 180 mg/dL add the following:  For BG 181 - 200 mg/dL give additional 1 unit   For BG 201 - 250 mg/dL give additional 3 units  For BG 251 - 300 mg/dL give additional 5 units  For BG 301 - 350 mg/dL give additional 7 units  For BG greater than 350 mg/dL give additional 8 units, Starting Tue 05/03/2021, No Print      !! insulin lispro 100 UNIT/ML injection Inject 1-4 Units into the skin nightly Medium dose correction insulin. Give even if NPO.  If BG greater than 180 mg/dL add the following:  For BG 181 - 200 mg/dL give 1 unit  For BG 201 - 250 mg/dL give 2 units  For BG 251 - 300 mg/dL give 3 units  For B G 301 - 350 mg/dL give 4 units  For BG greater than 350 mg/dL give 4 units, Starting Tue 05/03/2021, No Print      !! insulin lispro 100 UNIT/ML injection Inject 8 Units into the skin 3 (three) times daily before meals, Starting Tue 05/03/2021, No Print      lactobacillus/streptococcus (RISAQUAD) Cap Take 1 capsule by mouth daily, Starting Wed 04/20/2021, E-Rx      lidocaine (LIDODERM) 5 % Place 1 patch onto the skin every 24 hours Remove & Discard patch within 12 hours or as directed by MD, Starting Mon 05/02/2021, E-Rx      mupirocin (BACTROBAN) 2 % ointment Apply topically daily USe BID for 2 Weeks, Starting Tue 04/19/2021, E-Rx      pantoprazole (PROTONIX) 40 MG tablet Take 40 mg by mouth daily, Historical Med      petrolatum (AQUAPHOR) ointment Apply topically every 12 (twelve) hours, Starting Sun 03/06/2021, E-Rx      polyethylene glycol (MIRALAX) 17 g packet Take 17 g by mouth daily as needed (Constipation), Starting Mon 05/02/2021, E-Rx      senna-docusate (PERICOLACE) 8.6-50 MG per tablet Take 2 tablets by mouth nightly, Starting Mon 05/02/2021, E-Rx      !! sevelamer (RENVELA) 800 MG tablet Take 800 mg by mouth 2 (two) times daily with meals, Historical Med      !! sevelamer (RENVELA) 800 MG tablet Take 1 tablet (800 mg) by mouth  3 (three) times daily with meals, Starting Tue 05/03/2021, E-Rx       !! - Potential duplicate medications found. Please discuss with provider.         Allergies: She is allergic to lisinopril and metformin.              VISIT INFORMATION        Clinical Course in the ED:               Medications Given in the ED:    .  ED Medication Orders (From admission, onward)      Start Ordered     Status Ordering Provider    05/10/21 1454 05/10/21 1453    Once        Route: Intravenous  Ordered Dose: 10 mg     Discontinued [place]                Procedures:      Procedures        Interpretations:            Differential Diagnosis (not completely inclusive):               RESULTS        Lab Results:      Results       Procedure Component Value Units Date/Time    High Sensitivity Troponin-I [[id number]]  (Abnormal) Collected: 05/10/21 1532    Specimen: Blood Updated: 05/10/21 1604     hs Troponin-I 62.1 ng/L     CBC and differential [[id number]]  (Abnormal) Collected: 05/10/21 1532    Specimen: Blood Updated: 05/10/21 1559     WBC 6.27 x10 3/uL      Hgb 11.2 g/dL      Hematocrit 36.2 %      Platelets 101 x10 3/uL      RBC 3.93 x10 6/uL      MCV 92.1 fL      MCH 28.5 pg      MCHC 30.9 g/dL      RDW 14 %      MPV 12.7 fL      Instrument Absolute Neutrophil Count 4.45 x10 3/uL      Neutrophils 71.1 %      Lymphocytes Automated 14.0 %      Monocytes 10.5 %      Eosinophils Automated 3.3 %      Basophils Automated 0.8 %      Immature Granulocytes 0.3 %      Nucleated RBC 0.0 /100 WBC      Neutrophils Absolute 4.45 x10 3/uL      Lymphocytes Absolute Automated 0.88 x10 3/uL      Monocytes Absolute Automated 0.66 x10 3/uL      Eosinophils Absolute Automated 0.21 x10 3/uL      Basophils Absolute Automated 0.05 x10 3/uL      Immature Granulocytes Absolute 0.02 x10 3/uL      Absolute NRBC 0.00 x10 3/uL     GFR [[id number]] Collected: 05/10/21 1532     Updated: 05/10/21 1558     EGFR 62.0    Basic Metabolic Panel [[id number]]   (Abnormal) Collected: 05/10/21 1532    Specimen: Blood Updated: 05/10/21 1558     Glucose 228 mg/dL      BUN 33.0 mg/dL      Creatinine 4.4 mg/dL      Calcium 8.9 mg/dL      Sodium 137 mEq/L      Potassium 5.3 mEq/L      Chloride 100 mEq/L      CO2 26 mEq/L      Anion Gap 11.0                  Radiology Results:      XR Chest  AP Portable   Final Result         1. Mild pulmonary edema      J. Theresia Majors, MD   05/10/2021 3:56 PM  Scribe Attestation:      I was acting as a Education administrator for Cristal Deer, MD on Tenaglia,Gabbriella L   Treatment Team: Scribe: Andres Shad     I am the first provider for this patient and I personally performed the services documented. Treatment Team: Scribe: Andres Shad is scribing for me on Brull,Estera L .This note and the patient instructions accurately reflect work and decisions made by me.  Cristal Deer, MD                Cristal Deer, MD  05/11/21 2001

## 2021-05-10 NOTE — Telephone Encounter (Signed)
Yrai from [facility] called the office to say that she is trying to schedule the patient for a new patient consultation based on the results of her 04/29/21 left breast ultrasound. Results below.     Patient is currently living in a nursing home. The patient was at her appointment dialysis today, but is being sent to the hospital. Yrai, does not know why or the patients symptoms.     Patient was called by our office on 05/02/21 to schedule a new patient appointment with Dr. Foye Clock but the mailbox was full and we could not leave a message. Silvestre Moment will call the office to make a new patient appointment once the patient returns from the hospital and is in stable condition. Yrai was provided the number to call to reach the new patient coordinator.       IMPRESSION:    1. Increased skin thickening and edema as well as increased left axillary lymphadenopathy. Given that these findings predate the patient's fall and are worsening, inflammatory breast cancer must be  excluded. Recommend breast surgery consultation and skin punch biopsy. When the patient is an outpatient, diagnostic mammogram and follow-up  ultrasound should be performed.

## 2021-05-10 NOTE — ED Triage Notes (Signed)
Pt BIBA for CP. Pt  was at her dialysis appointment, during dialysis she began to experience chest pain- 9/10 pain; pt stated having SOB- pt is currently on 2 L on NC with 97% SPO2 and is not in distress at this time; pt has a fistula on her left arm. Pt states being on 2L of O2 back home; is not currently experiencing dizziness or HA at this time.

## 2021-05-11 ENCOUNTER — Telehealth (HOSPITAL_BASED_OUTPATIENT_CLINIC_OR_DEPARTMENT_OTHER): Payer: Self-pay

## 2021-05-11 NOTE — Telephone Encounter (Signed)
[facility], Urai [phone]    Physician/ Location Preference:  Location Preference: FFX    Physician Preference: Dr. Foye Clock    Referral:  Referring Provider  [facility]    Is Referral required per insurance no    History:  Personal Hx of Breast Cancer: no    Personal History of Ovarian Cancer:no    Personal History of any other cancer: no      Family Hx of Breast Cancer :no    Family History of Ovarian Cancer: no    Mom - throat    Imaging History:  Mammo:no    Breast MRI: no    Breast Ultrasound: 04/28/21  Biopsy History:  no    Breast Surgery:  no     Misc Testing History:  Genetic Testing:no    Other:     Are there patient owned records that will be brought to the first appointment?no    Are you using medications for breast Pain- no

## 2021-05-12 ENCOUNTER — Encounter (INDEPENDENT_AMBULATORY_CARE_PROVIDER_SITE_OTHER): Payer: Self-pay

## 2021-05-12 ENCOUNTER — Other Ambulatory Visit (INDEPENDENT_AMBULATORY_CARE_PROVIDER_SITE_OTHER): Payer: Self-pay | Admitting: Surgery

## 2021-05-12 DIAGNOSIS — N6489 Other specified disorders of breast: Secondary | ICD-10-CM

## 2021-05-12 NOTE — Progress Notes (Addendum)
Patient is scheduled for the following appt:    Bilateral Diagnostic Mammogram and PRN U/S   Wednesday, 4/25 1:00pm (arrival 12:45)  [facility]  [place],  [address]    [phone]    Urai, the manager of the nursing home where the patient lives, is aware and will arrange transportation for the patient.

## 2021-05-17 ENCOUNTER — Ambulatory Visit: Payer: 59 | Attending: Surgery

## 2021-05-17 ENCOUNTER — Ambulatory Visit: Payer: 59

## 2021-05-17 ENCOUNTER — Telehealth: Payer: Self-pay

## 2021-05-17 DIAGNOSIS — R590 Localized enlarged lymph nodes: Secondary | ICD-10-CM | POA: Insufficient documentation

## 2021-05-17 DIAGNOSIS — N6459 Other signs and symptoms in breast: Secondary | ICD-10-CM | POA: Insufficient documentation

## 2021-05-17 DIAGNOSIS — R600 Localized edema: Secondary | ICD-10-CM | POA: Insufficient documentation

## 2021-05-17 DIAGNOSIS — N6489 Other specified disorders of breast: Secondary | ICD-10-CM | POA: Insufficient documentation

## 2021-05-17 NOTE — Telephone Encounter (Signed)
3:30pm - Received call from Dr. Hampton Abbot at the [facility] where pt had Dx Mammo & U/S today 4/25.  Discussed the following results:    IMPRESSION:      1. Skin thickening in left lower outer breast on mammograms with ultrasound demonstrating persistent skin thickening and subcutaneous edema in addition to left axillary adenopathy.  2. Provided history that these sonographic findings predated recent injury. Therefore recommend surgical skin punch biopsy of the left breast for further evaluation. An inflammatory process, including but not limited to inflammatory carcinoma, is of   concern.    Pt is scheduled with Dr. Foye Clock 5/2 at 9:45am to perform skin punch biopsy.    Relayed results to Dr. Foye Clock via secure message.      4:30pm  Called Urai, manager of the SNF where pt currently lives to ensure pt will be able to make 5/2 morning appt since pt normally has dialysis on Tues, Thurs, Sat mornings.  Urai confirmed she would contact dialysis to adjust schedule so pt will be able to make 5/2 appt w/Dr. Foye Clock.    5:00pm Called pt to discuss Mammo results, Dr. Foye Clock appt, and scheduling of Left axilla biopsy.  Pt stated she was busy at the time and to call back in the morning and provided additional cell number.

## 2021-05-18 ENCOUNTER — Other Ambulatory Visit: Payer: Self-pay | Admitting: Surgery

## 2021-05-18 ENCOUNTER — Telehealth: Payer: Self-pay

## 2021-05-18 DIAGNOSIS — R928 Other abnormal and inconclusive findings on diagnostic imaging of breast: Secondary | ICD-10-CM

## 2021-05-18 DIAGNOSIS — R590 Localized enlarged lymph nodes: Secondary | ICD-10-CM

## 2021-05-18 NOTE — Telephone Encounter (Signed)
Left VM and CB number [phone] for Urai, manager of nursing home where pt lives, to arrange transportation for the following appt:    Left axilla biopsy  Tuesday, May 2  8:15am (Arrive 8:00)  [facility] [address] [place], [place] [address]  [phone]

## 2021-05-18 NOTE — Telephone Encounter (Signed)
Left VM and CB number [phone].

## 2021-05-18 NOTE — Telephone Encounter (Signed)
Called patient regarding abnormal mammogram on 4/25.  No mass was found but skin punch biopsy is recommended.  Dr. Foye Clock also ordered a Left axilla biopsy based on mammogram findings.  Patient has the following appts scheduled:    Left axilla FNA biopsy 5/2 8:00am   [facility]  Dr. Foye Clock, breast surgeon 5/2 9:45am   [place], [address]   Skin punch biopsy will be performed.      Urai, Freight forwarder of nursing home, is aware of these appointments and is arranging transportation.  Patient dialysis schedule will also be adjusted for these appointments.  Patient verbalized understanding of all and no questions at this time.

## 2021-05-20 ENCOUNTER — Telehealth: Payer: Self-pay

## 2021-05-20 NOTE — Telephone Encounter (Signed)
Confirmed with Urai, Freight forwarder of nursing facility, that transportation is confirmed for patient's Tuesday morning appts next week.      Left axilla FNA biopsy 5/2 8:00am              [facility]  Dr. Foye Clock, breast surgeon 5/2 9:45am              [place], [address]th Floor

## 2021-05-23 DIAGNOSIS — R234 Changes in skin texture: Secondary | ICD-10-CM | POA: Insufficient documentation

## 2021-05-23 DIAGNOSIS — R590 Localized enlarged lymph nodes: Secondary | ICD-10-CM | POA: Insufficient documentation

## 2021-05-23 NOTE — Progress Notes (Unsigned)
Breast Consultation Note    [name] is a 66 y.o. female with HTN, COPD, DM, ESRD on HD (MWF) , on ASA 38 referred by Dr. Barbaraann Faster presenting with left breast skin thickening.   She reports her left breast started to feel hard and the skin became dark about 2 months ago. She was told to undergo breast imaging which became delayed. A month later she fell and was admitted to [facility] with encephalitis. She reports that the breast changes have been steadily improving since 2 months ago, still a little hardness per the patient.  She underwent BLdxMG and a left targeted US on 05/12/21. BLdxMG showed left lower outer skin thickening. US showed 3:00-7:00 skin thickening and edema, as well as left axillary lymphadenopathy (enlarged lymph node with cortical thickening), stable compared to prior exams, BIRADS 4, skin biopsy recommended. She underwent a left axillary lymph node biopsy on 05/24/21 with prelim cytology showing benign lymphoid tissue, final pathology is pending.   She is currently staying at a SNF.   FHx: No breast or ovarian. Her brother died of prostate cancer.     Genetic Testing: none   Breast surgeries: no   Menarche at age: 36   Menopause at age: 12s  Parity: 69  1st child at age: 70  HRT: no   XRT: none     Breast Imaging  05/12/21 BLdxMG: Hetero dense. Few scattered benign calcification in both breasts. Asymmetric left sided skin thickening of the anterior lower outer left breast.   Left lmt [place]:  BIRADS 4. exam of 3:00-7:00 regions reveals persistent skin thickening and subcutaneous edema without underlying breast mass. Redemonstration of enlarged left axillary lymph node with cortical thickening, no appreciably changed compared to prior US exams. Recommend skin punch biopsy.     01/07/21 Left lmt [place]: BIRADS 0. Subcutaneous edema at 3:00. Left axillary LN measuring 2.0 cm with a fatty hilum but exhibiting cortical thickening. Diagnostic mammogram recommended.     Breast Pathology  05/18/21 Left  axillary FNA: prelim cytology shows benign lymphoid tissue. Pending final pathology report.     Family History   Problem Relation Age of Onset    Diabetes Mother     Hypertension Mother     Diabetes Father     Hypertension Father     Diabetes Sister     Hypertension Sister     Diabetes Brother     Hypertension Brother     Diabetes Paternal Aunt     Diabetes Paternal Uncle        Patient Active Problem List   Diagnosis    Iron deficiency anemia secondary to inadequate dietary iron intake    Sideropenic dysphagia    Peritoneal dialysis catheter dysfunction, initial encounter    Peritonitis    Upper GI bleed    ESRD (end stage renal disease) on dialysis    Generalized abdominal pain    Thrombocytopenia    h/o orthostatic hypotension    Type 2 diabetes mellitus, with long-term current use of insulin    GERD (gastroesophageal reflux disease)    Bowel incontinence    Right sided numbness    Uncontrolled type 2 diabetes mellitus with hyperglycemia    Hypertension    Hyperlipidemia    History of COPD    Gastritis with hemorrhage    History of anemia due to chronic kidney disease    Open wound, abdominal wall, lateral, subsequent encounter    Gout    Dizziness  Fall    History of CVA (cerebrovascular accident)    Pneumonia due to COVID-19 virus    Diarrhea    Hypertensive urgency    Shortness of breath    ESRD (end stage renal disease)    Hyperkalemia    Pulmonary edema    Hypervolemia    Elevated troponin    Chest pain    Nausea vomiting and diarrhea    Breast pain, left    Syncope    Hypertensive emergency    Volume overload    Altered mental status    Breast skin changes    Axillary lymphadenopathy       Past Surgical History:   Procedure Laterality Date    EGD, BIOPSY N/A 05/14/2019    Procedure: EGD, BIOPSY;  Surgeon: Ronie Spies, MD;  Location: [facility];  Service: Gastroenterology;  Laterality: N/A;    EXPLORATORY LAPAROTOMY N/A 05/27/2019    Procedure: EXPLORATORY LAPAROTOMY;  Surgeon: Herbert Moors.,  MD;  Location: [facility];  Service: General;  Laterality: N/A;    JOINT REPLACEMENT      shoulder    JOINT REPLACEMENT      knee    OVARY SURGERY Left     Removal    REMOVAL, FOREIGN BODY N/A 05/27/2019    Procedure: REMOVAL, PERITONEAL DIALYSIS CATHETER;  Surgeon: Herbert Moors., MD;  [facility]: [facility];  Service: General;  Laterality: N/A;    TUNNELED CATH CHECK/CHANGE (PERMCATH) N/A 07/03/2019    Procedure: TUNNELED CATH CHECK/CHANGE;  Surgeon: Maureen Ralphs, MD;  Location: [facility];  Service: Interventional Radiology;  Laterality: N/A;    TUNNELED CATH PLACEMENT (PERMCATH) Bilateral 05/23/2019    Procedure: TUNNELED CATH PLACEMENT;  Surgeon: Jacques Earthly, MD;  [facility];  Service: Interventional Radiology;  Laterality: Bilateral;    TUNNELED CATH PLACEMENT (PERMCATH) Right 07/31/2019    Procedure: TUNNELED CATH PLACEMENT;  Surgeon: Jacques Earthly, MD;  [facility];  Service: Interventional Radiology;  Laterality: Right;       Current Outpatient Medications   Medication Sig Dispense Refill    acetaminophen (TYLENOL) 325 MG tablet Take 2 tablets (650 mg) by mouth every 6 (six) hours as needed for Fever 30 tablet 0    amLODIPine (NORVASC) 10 MG tablet Take 1 tablet by mouth daily      aspirin 81 MG chewable tablet Chew 81 mg by mouth daily      atorvastatin (LIPITOR) 40 MG tablet Take 1 tablet (40 mg) by mouth daily 30 tablet 0    carvedilol (COREG) 25 MG tablet Take 1 tablet (25 mg) by mouth every 12 (twelve) hours 60 tablet 0    gabapentin (NEURONTIN) 100 MG capsule Take 1 capsule (100 mg) by mouth every 8 (eight) hours 30 capsule 0    guaiFENesin (ROBITUSSIN) 100 MG/5ML oral liquid Take 10 mLs (200 mg) by mouth every 4 (four) hours as needed for Cough or Congestion 118 mL 0    insulin glargine (LANTUS) 100 UNIT/ML injection Inject 40 Units into the skin nightly      insulin lispro 100 UNIT/ML injection Inject 1-8 Units into the skin 3 (three) times daily before meals  Medium dose correction insulin. Add to prandial insulin dose if ordered. Give even if NPO.  If BG greater than 180 mg/dL add the following:  For BG 181 - 200 mg/dL give additional 1 unit  For BG 201 - 250 mg/dL give additional 3 units  For  BG 251 - 300 mg/dL give additional 5 units  For BG 301 - 350 mg/dL give additional 7 units  For BG greater than 350 mg/dL give additional 8 units      insulin lispro 100 UNIT/ML injection Inject 1-4 Units into the skin nightly Medium dose correction insulin. Give even if NPO.  If BG greater than 180 mg/dL add the following:  For BG 181 - 200 mg/dL give 1 unit  For BG 201 - 250 mg/dL give 2 units  For BG 251 - 300 mg/dL give 3 units  For BG 301 - 350 mg/dL give 4 units  For BG greater than 350 mg/dL give 4 units      insulin lispro 100 UNIT/ML injection Inject 8 Units into the skin 3 (three) times daily before meals      lidocaine (LIDODERM) 5 % Place 1 patch onto the skin every 24 hours Remove & Discard patch within 12 hours or as directed by MD 30 patch 0    mupirocin (BACTROBAN) 2 % ointment Apply topically daily USe BID for 2 Weeks 15 g 0    OMEPRAZOLE PO Take by mouth      OXYCODONE HCL PO Take by mouth      sevelamer (RENVELA) 800 MG tablet Take 800 mg by mouth 2 (two) times daily with meals      sevelamer (RENVELA) 800 MG tablet Take 1 tablet (800 mg) by mouth 3 (three) times daily with meals 90 tablet 0    acetaminophen (TYLENOL) 500 MG tablet Take 500 mg by mouth every 6 (six) hours as needed for Pain (Patient not taking: Reported on 05/24/2021)      albuterol sulfate HFA (PROVENTIL) 108 (90 Base) MCG/ACT inhaler Inhale 2 puffs into the lungs every 6 (six) hours as needed for Wheezing or Shortness of Breath (Patient not taking: Reported on 05/24/2021)      albuterol-ipratropium (DUO-NEB) 2.5-0.5(3) mg/3 mL nebulizer Take 3 mLs by nebulization every 8 (eight) hours (Patient not taking: Reported on 05/24/2021) 100 mL 0    dextromethorphan-guaiFENesin (MUCINEX DM) 30-600 MG per 12  hr tablet Take 1 tablet by mouth every 12 (twelve) hours (Patient not taking: Reported on 05/24/2021) 14 tablet 1    GABAPENTIN PO Take by mouth      lactobacillus/streptococcus (RISAQUAD) Cap Take 1 capsule by mouth daily (Patient not taking: Reported on 05/24/2021) 30 capsule 0    pantoprazole (PROTONIX) 40 MG tablet Take 40 mg by mouth daily (Patient not taking: Reported on 05/24/2021)      petrolatum (AQUAPHOR) ointment Apply topically every 12 (twelve) hours (Patient not taking: Reported on 05/24/2021) 30 g 0    polyethylene glycol (MIRALAX) 17 g packet Take 17 g by mouth daily as needed (Constipation) (Patient not taking: Reported on 05/24/2021) 10 each 0    senna-docusate (PERICOLACE) 8.6-50 MG per tablet Take 2 tablets by mouth nightly (Patient not taking: Reported on 05/24/2021) 10 tablet 0     No current facility-administered medications for this visit.       Allergies   Allergen Reactions    Lisinopril Hives    Metformin Hives       ROS:   Review of Systems   Constitutional:  Negative for chills, fever and weight loss.   HENT:  Negative for congestion, ear pain, hearing loss, nosebleeds, sinus pain, sore throat and tinnitus.    Eyes:  Negative for double vision, pain, discharge and redness.   Respiratory:  Negative for wheezing.  Cardiovascular:  Negative for chest pain. Negative for palpitations and leg swelling.   Gastrointestinal:  Negative for abdominal pain, blood in stool, diarrhea, heartburn, nausea and vomiting.   Genitourinary:  Negative for dysuria, flank pain and hematuria.   Musculoskeletal:  Negative for back pain, myalgias and neck pain.   Skin:  Negative for itching and rash.   Neurological:  Negative for dizziness, tingling, tremors, seizures, weakness and headaches.   Psychiatric/Behavioral:  The patient is not nervous/anxious.      Vitals:    05/24/21 1012   BP: 150/71   Pulse: 61   Resp: 16   Temp: 98.5 F (36.9 C)   SpO2: 94%     Examination:  Constitutional:       Appearance: Normal  appearance. Normal weight.   Neck:   No thyromegaly, no thyroid nodules. Trachea is midline.   Eyes:      Pupils: Pupils are equal, round, and reactive to light.   Cardiovascular:      Rate and Rhythm: Normal rate and regular rhythm.   Pulmonary:      Effort: Pulmonary effort is normal.      Breath sounds: Normal breath sounds.   Chest:   Breasts:      Right: No inverted nipple, mass, nipple discharge or skin change.      Left: Possibly very subtle NAC skin thickening, but difficult to discern. Varicose veins. UOQ is tender to palpation. No inverted nipple, mass, nipple discharge  Abdominal:      General: Abdomen is flat.      Palpations: Abdomen is soft.      Tenderness: There is no abdominal tenderness.   Musculoskeletal:         General: Normal range of motion.      Cervical back: Normal range of motion and neck supple.   Lymphadenopathy:    Neck: No cervical lymphadenopathy      Right upper body: No supraclavicular or axillary adenopathy.      Left upper body: Enlarged oval, soft, mobile axillary lymph node. No supraclavicular adenopathy.   Skin:     General: Skin is warm and dry.   Neurological:      General: No focal deficit present.      Mental Status: She is alert and oriented to person, place, and time.   Psychiatric:         Mood and Affect: Mood normal.         Behavior: Behavior normal.     Impression: 66 year old women with left breast skin thickening and left axillary lymphadenopathy.       Recommend: History is not consistent with malignancy, as the left breast changes have improved over time and her clinical exam today is near normal. However, given the mammographic findings, I recommend a punch biopsy of the left breast.     - Left breast punch biopsy today  - I will call Tuyet with the pathology  - Follow up pathology of left axillary lymph node biopsy  - If above negative, return to see me in August/Sept     Philipp Ovens, MD  Breast and Endocrine Surgeon   [facility] (breast):  [phone]  [facility] (endocrine): [phone]

## 2021-05-24 ENCOUNTER — Other Ambulatory Visit (FREE_STANDING_LABORATORY_FACILITY): Payer: 59

## 2021-05-24 ENCOUNTER — Ambulatory Visit: Payer: 59 | Attending: Surgery | Admitting: Surgery

## 2021-05-24 ENCOUNTER — Encounter: Payer: Self-pay | Admitting: Surgery

## 2021-05-24 ENCOUNTER — Ambulatory Visit: Payer: Self-pay

## 2021-05-24 VITALS — BP 150/71 | HR 61 | Temp 98.5°F | Resp 16 | Ht 62.0 in

## 2021-05-24 DIAGNOSIS — N6451 Induration of breast: Secondary | ICD-10-CM

## 2021-05-24 DIAGNOSIS — R234 Changes in skin texture: Secondary | ICD-10-CM

## 2021-05-24 DIAGNOSIS — R590 Localized enlarged lymph nodes: Secondary | ICD-10-CM

## 2021-05-24 HISTORY — PX: PR PUNCH BIOPSY SKIN SINGLE LESION: 11104

## 2021-05-24 LAB — SURGICAL PATHOLOGY EXAM

## 2021-05-24 NOTE — Procedures (Signed)
Procedure: Punch biopsy  Location: left breast 3:00 periareolar   Excised Skin: 6 mm punch   After discussing the benefits and risks of a skin biopsy including adverse event infection and bleeding, informed consent was obtained to perform a punch biopsy of the lesion. The area was then prepped in the usual sterile fashion. 1% lidocaine was administered until adequate anesthesia was obtained. Betadine was used to sterilize the atomic site and a 6 mm punch biopsy was used to obtain the specimens. Hemostasis was obtained with a subcuticular interrupted vertical suture using 4-0 monocryl.  The site was dressed with steri-strips. The patient tolerated the procedure well and was provided verbal and written wound care instructions. She has been informed that the steri strips may be removed in 7 days. The specimen was sent for histopathological analysis

## 2021-05-25 ENCOUNTER — Encounter: Payer: Self-pay | Admitting: Surgery

## 2021-05-26 LAB — LAB USE ONLY - HISTORICAL SURGICAL PATHOLOGY

## 2021-05-27 ENCOUNTER — Telehealth: Payer: Self-pay

## 2021-05-27 NOTE — Telephone Encounter (Signed)
After reviewing results with Dr. Foye Clock, called pt to inform her results of Left axilla biopsy and Left breast punch biopsy were both Negative for malignancy.  Please keep F/U appt with Dr. Foye Clock on 9/7 at 2:15pm at the [facility] clinic for a clinical exam.  Pt verbalized understanding of all and no further questions at this time.      Left VM and CB number with Urai, manager of the nursing home where pt resides, that no more F/U appts will be needed at this time until the appt with Dr. Foye Clock on 9/7 at 2:15pm at the [facility] clinic.

## 2021-06-01 ENCOUNTER — Ambulatory Visit
Payer: 59 | Attending: Interventional Radiology and Diagnostic Radiology | Admitting: Interventional Radiology and Diagnostic Radiology

## 2021-06-01 DIAGNOSIS — Z992 Dependence on renal dialysis: Secondary | ICD-10-CM | POA: Insufficient documentation

## 2021-06-01 DIAGNOSIS — Z79899 Other long term (current) drug therapy: Secondary | ICD-10-CM | POA: Insufficient documentation

## 2021-06-01 DIAGNOSIS — E785 Hyperlipidemia, unspecified: Secondary | ICD-10-CM | POA: Insufficient documentation

## 2021-06-01 DIAGNOSIS — Z993 Dependence on wheelchair: Secondary | ICD-10-CM | POA: Insufficient documentation

## 2021-06-01 DIAGNOSIS — E11621 Type 2 diabetes mellitus with foot ulcer: Secondary | ICD-10-CM | POA: Insufficient documentation

## 2021-06-01 DIAGNOSIS — Z7982 Long term (current) use of aspirin: Secondary | ICD-10-CM | POA: Insufficient documentation

## 2021-06-01 DIAGNOSIS — E669 Obesity, unspecified: Secondary | ICD-10-CM | POA: Insufficient documentation

## 2021-06-01 DIAGNOSIS — I120 Hypertensive chronic kidney disease with stage 5 chronic kidney disease or end stage renal disease: Secondary | ICD-10-CM | POA: Insufficient documentation

## 2021-06-01 DIAGNOSIS — E1122 Type 2 diabetes mellitus with diabetic chronic kidney disease: Secondary | ICD-10-CM | POA: Insufficient documentation

## 2021-06-01 DIAGNOSIS — J449 Chronic obstructive pulmonary disease, unspecified: Secondary | ICD-10-CM | POA: Insufficient documentation

## 2021-06-01 DIAGNOSIS — Z794 Long term (current) use of insulin: Secondary | ICD-10-CM | POA: Insufficient documentation

## 2021-06-01 DIAGNOSIS — G4733 Obstructive sleep apnea (adult) (pediatric): Secondary | ICD-10-CM | POA: Insufficient documentation

## 2021-06-01 DIAGNOSIS — Z09 Encounter for follow-up examination after completed treatment for conditions other than malignant neoplasm: Secondary | ICD-10-CM | POA: Insufficient documentation

## 2021-06-01 DIAGNOSIS — E1151 Type 2 diabetes mellitus with diabetic peripheral angiopathy without gangrene: Secondary | ICD-10-CM | POA: Insufficient documentation

## 2021-06-01 DIAGNOSIS — N186 End stage renal disease: Secondary | ICD-10-CM | POA: Insufficient documentation

## 2021-06-02 NOTE — Progress Notes (Signed)
FOLLOW UP NOTE      HPI:   [name] returns for re-evaluation of right great toe non healing ulcer/dry gangrene in the setting of chronic bilateral infrainguinal PAD.    She is a 66 y.o. female with a PHM of ESRD on HD, DM, HTN, HLD, obesity, OSA, COPD and PAD who initially seen in consultation for underlying PAD with right great toe ulcer during her previous admission to [place] on 04/26/2021. Recommendation was made for medical management and aggressive wound care at that time. She was discharged to [facility] and has not has significant healing of her ulcer. Except for a wound care nurse that sees her to clean the site and change the dressing, she has not been seen or evaluated by Podiatry since her admission to [place] last month. The patient reports that the wound care nurse expressed purulent fluid from the toe this morning before her visit to [place].    Although she is in rehab and receives daily physical therapy, she is not fully ambulatory. She spends much of her time wheelchair dependent and cannot attest to claudication in either leg. She denies rest pain in either leg.       Past Medical History:     Past Medical History:   Diagnosis Date    Chronic obstructive pulmonary disease     Diabetes mellitus     Hyperlipidemia     Hypertension     Obesity     PAD (peripheral artery disease)     Sleep apnea 2018       Past Surgical History:     Past Surgical History:   Procedure Laterality Date    EGD, BIOPSY N/A 05/14/2019    Procedure: EGD, BIOPSY;  Surgeon: Ronie Spies, MD;  Location: [facility];  Service: Gastroenterology;  Laterality: N/A;    EXPLORATORY LAPAROTOMY N/A 05/27/2019    Procedure: EXPLORATORY LAPAROTOMY;  Surgeon: Herbert Moors., MD;  [facility];  Service: General;  Laterality: N/A;    JOINT REPLACEMENT      shoulder    JOINT REPLACEMENT      knee    OVARY SURGERY Left     Removal    PR PUNCH BIOPSY SKIN SINGLE LESION  05/24/2021    REMOVAL, FOREIGN BODY N/A  05/27/2019    Procedure: REMOVAL, PERITONEAL DIALYSIS CATHETER;  Surgeon: Herbert Moors., MD;  [facility];  Service: General;  Laterality: N/A;    TUNNELED CATH CHECK/CHANGE (PERMCATH) N/A 07/03/2019    Procedure: TUNNELED CATH CHECK/CHANGE;  Surgeon: Maureen Ralphs, MD;  [facility];  Service: Interventional Radiology;  Laterality: N/A;    TUNNELED CATH PLACEMENT (PERMCATH) Bilateral 05/23/2019    Procedure: TUNNELED CATH PLACEMENT;  Surgeon: Jacques Earthly, MD;  [facility];  Service: Interventional Radiology;  Laterality: Bilateral;    TUNNELED CATH PLACEMENT (PERMCATH) Right 07/31/2019    Procedure: TUNNELED CATH PLACEMENT;  Surgeon: Jacques Earthly, MD;  [facility];  Service: Interventional Radiology;  Laterality: Right;       Allergies:     Allergies   Allergen Reactions    Lisinopril Hives    Metformin Hives       Medications:     Current Outpatient Medications   Medication Sig Dispense Refill    acetaminophen (TYLENOL) 325 MG tablet Take 2 tablets (650 mg) by mouth every 6 (six) hours as needed for Fever 30 tablet 0    albuterol sulfate HFA (  PROVENTIL) 108 (90 Base) MCG/ACT inhaler Inhale 2 puffs into the lungs every 6 (six) hours as needed for Wheezing or Shortness of Breath (Patient not taking: Reported on 05/24/2021)      amLODIPine (NORVASC) 10 MG tablet Take 1 tablet by mouth daily      aspirin 81 MG chewable tablet Chew 81 mg by mouth daily      atorvastatin (LIPITOR) 40 MG tablet Take 1 tablet (40 mg) by mouth daily 30 tablet 0    carvedilol (COREG) 25 MG tablet Take 1 tablet (25 mg) by mouth every 12 (twelve) hours 60 tablet 0    gabapentin (NEURONTIN) 100 MG capsule Take 1 capsule (100 mg) by mouth every 8 (eight) hours 30 capsule 0    GABAPENTIN PO Take by mouth      guaiFENesin (ROBITUSSIN) 100 MG/5ML oral liquid Take 10 mLs (200 mg) by mouth every 4 (four) hours as needed for Cough or Congestion 118 mL 0    insulin glargine (LANTUS) 100 UNIT/ML  injection Inject 40 Units into the skin nightly      insulin lispro 100 UNIT/ML injection Inject 1-8 Units into the skin 3 (three) times daily before meals Medium dose correction insulin. Add to prandial insulin dose if ordered. Give even if NPO.  If BG greater than 180 mg/dL add the following:  For BG 181 - 200 mg/dL give additional 1 unit  For BG 201 - 250 mg/dL give additional 3 units  For BG 251 - 300 mg/dL give additional 5 units  For BG 301 - 350 mg/dL give additional 7 units  For BG greater than 350 mg/dL give additional 8 units      insulin lispro 100 UNIT/ML injection Inject 1-4 Units into the skin nightly Medium dose correction insulin. Give even if NPO.  If BG greater than 180 mg/dL add the following:  For BG 181 - 200 mg/dL give 1 unit  For BG 201 - 250 mg/dL give 2 units  For BG 251 - 300 mg/dL give 3 units  For BG 301 - 350 mg/dL give 4 units  For BG greater than 350 mg/dL give 4 units      insulin lispro 100 UNIT/ML injection Inject 8 Units into the skin 3 (three) times daily before meals      lactobacillus/streptococcus (RISAQUAD) Cap Take 1 capsule by mouth daily (Patient not taking: Reported on 05/24/2021) 30 capsule 0    lidocaine (LIDODERM) 5 % Place 1 patch onto the skin every 24 hours Remove & Discard patch within 12 hours or as directed by MD 30 patch 0    mupirocin (BACTROBAN) 2 % ointment Apply topically daily USe BID for 2 Weeks 15 g 0    OMEPRAZOLE PO Take by mouth      OXYCODONE HCL PO Take by mouth      pantoprazole (PROTONIX) 40 MG tablet Take 40 mg by mouth daily (Patient not taking: Reported on 05/24/2021)      polyethylene glycol (MIRALAX) 17 g packet Take 17 g by mouth daily as needed (Constipation) (Patient not taking: Reported on 05/24/2021) 10 each 0    senna-docusate (PERICOLACE) 8.6-50 MG per tablet Take 2 tablets by mouth nightly (Patient not taking: Reported on 05/24/2021) 10 tablet 0    sevelamer (RENVELA) 800 MG tablet Take 800 mg by mouth 2 (two) times daily with meals       sevelamer (RENVELA) 800 MG tablet Take 1 tablet (800 mg) by mouth 3 (three) times daily  with meals 90 tablet 0     No current facility-administered medications for this visit.       Medication list, dosage, frequency and administration were reviewed.    Family History:     Family History   Problem Relation Age of Onset    Diabetes Mother     Hypertension Mother     Diabetes Father     Hypertension Father     Diabetes Sister     Hypertension Sister     Diabetes Brother     Hypertension Brother     Diabetes Paternal Aunt     Diabetes Paternal Uncle        Social History:     Social History     Socioeconomic History    Marital status: Legally Separated   Tobacco Use    Smoking status: Never    Smokeless tobacco: Never   Vaping Use    Vaping status: Never Used   Substance and Sexual Activity    Alcohol use: Never    Drug use: Never    Sexual activity: Not Currently       ROS:   History obtained from chart review and the patient    Pertinent items are noted in HPI.      Physical Exam:   Visit Vitals  BP 115/67   Pulse 63   Temp 98.5 F (36.9 C)   Resp 14   Ht 1.575 m (5\' 2" )   Wt 102.1 kg (225 lb)   LMP  (LMP Unknown)   SpO2 92%   BMI 41.15 kg/m       APPEARANCE: Obese, chronically ill appearing female in NAD  NEURO: A+Ox3  HEART: RRR, S1S2  LUNGS: CTAB  ABDOMEN: Obese, soft, NT/ND, + bowel sounds  EXTREMITIES: Right great toe ulcer/dry gangrene, toenail absent s/p avulsion, area covered with betadine solution.   Non palpable DP and PT pulses, feet warm bilaterally, no acute limb ischemia    Radiology:     BILATERAL LOWER EXTREMITY NONINVASIVE PHYSIOLOGIC ARTERIAL EXAM     HISTORY: Patient is a 66 year old female with end-stage renal disease on  dialysis with diabetes mellitus and new wound of the right hallux.     PROCEDURE: Bilateral pulse wave Doppler examination, plethysmographic  examination, and segmental arterial pressure measurements with ABI and  TBI measurements (when possible and indicated) was performed.  Digital  photoplethysmography was performed bilaterally.     FINDINGS:     Right brachial pressure: 181 mm Hg  Left brachial pressure: Not obtained  Right ABI: Noncompressible   Left ABI: 0.87, likely falsely elevated secondary to poor  compressibility      Right side toe pressure: 72 mm Hg  Right TBI: 0.40   Left side toe pressure: 80 mm Hg  Left TBI: 0.44      Right side Doppler  CFA waveform: Triphasic  SFA waveform: Monophasic  Popliteal waveform: Monophasic  PT waveform: Monophasic  DP waveform: Biphasic     Left side Doppler  CFA waveform: Triphasic  SFA waveform: Monophasic  Popliteal waveform: Triphasic  PT waveform: Monophasic  DP waveform: Triphasic     Right side PVR  High thigh: Mild dampening  Low thigh: Mild dampening  Calf: Normal amplitude and morphology  Ankle: Mild dampening  Metatarsal: Normal amplitude and morphology     Left side PVR  High thigh: Mild density  Low thigh: Mild dampening  Calf: Mild dampening  Ankle: Mild dampening  Metatarsal: Normal amplitude and  morphology     Right side digital waveforms: Moderate dampening  Left side digital waveforms: Mild dampening     IMPRESSION:    Abnormal physiologic study with evidence of infrainguinal  disease bilaterally. There is moderate dampening of the digital  waveforms in the right foot. Some pulsatility of the waveforms is  present, which suggests the patient may heal her ulceration. However, if  she does not heal, she would be a candidate for arteriography and  possible revascularization. These findings and plan were discussed with  Dr. Maudie Mercury.           Clenton Pare, MD  04/26/2021 11:09 AM     Assessment:   Chronic bilateral infrainguinal PAD  Right great toe non-healing ulcer/dry gangrene    Plan:   Based on physical exam and imaging she is a potential candidate for intervention with angiogram +/- revascularization, however, recommend Podiatry evaluation given worsening toe ulcer and report of purulent drainage.     Definitive treatment  recommendations pending review of Podiatry evaluation.  Patient and daughter verbalize understand of our discussion and agree with the plan.  All their questions were answered.      More than half of this 30 minute visit was spent in discussion and coordination of care.    Huel Coventry, NP  [facility]  [phone]      I have evaluated the patient and agree with the above documented evaluation and plan.    Signed by: Sherri Rad, [place] [facility]  [phone]

## 2021-06-08 ENCOUNTER — Ambulatory Visit: Payer: 59 | Attending: Foot & Ankle Surgery

## 2021-06-08 DIAGNOSIS — Z992 Dependence on renal dialysis: Secondary | ICD-10-CM | POA: Insufficient documentation

## 2021-06-08 DIAGNOSIS — S91101A Unspecified open wound of right great toe without damage to nail, initial encounter: Secondary | ICD-10-CM | POA: Insufficient documentation

## 2021-06-08 DIAGNOSIS — N186 End stage renal disease: Secondary | ICD-10-CM | POA: Insufficient documentation

## 2021-06-08 DIAGNOSIS — E669 Obesity, unspecified: Secondary | ICD-10-CM | POA: Insufficient documentation

## 2021-06-08 DIAGNOSIS — I120 Hypertensive chronic kidney disease with stage 5 chronic kidney disease or end stage renal disease: Secondary | ICD-10-CM | POA: Insufficient documentation

## 2021-06-08 DIAGNOSIS — I70235 Atherosclerosis of native arteries of right leg with ulceration of other part of foot: Secondary | ICD-10-CM | POA: Insufficient documentation

## 2021-06-08 DIAGNOSIS — E1122 Type 2 diabetes mellitus with diabetic chronic kidney disease: Secondary | ICD-10-CM | POA: Insufficient documentation

## 2021-06-08 DIAGNOSIS — Z6841 Body Mass Index (BMI) 40.0 and over, adult: Secondary | ICD-10-CM | POA: Insufficient documentation

## 2021-06-08 DIAGNOSIS — E785 Hyperlipidemia, unspecified: Secondary | ICD-10-CM | POA: Insufficient documentation

## 2021-06-08 DIAGNOSIS — L97519 Non-pressure chronic ulcer of other part of right foot with unspecified severity: Secondary | ICD-10-CM | POA: Insufficient documentation

## 2021-06-08 DIAGNOSIS — J449 Chronic obstructive pulmonary disease, unspecified: Secondary | ICD-10-CM | POA: Insufficient documentation

## 2021-06-21 ENCOUNTER — Telehealth (INDEPENDENT_AMBULATORY_CARE_PROVIDER_SITE_OTHER): Payer: Self-pay | Admitting: Surgery

## 2021-06-21 NOTE — Telephone Encounter (Signed)
Below message was received from Elaina Pattee.  Patient's chart is marked as deceased.    [name]  Female, 66 y.o., [birth year]  MRN: [id number]    Juluis Rainier - this is a Dr. Foye Clock patient who died over the weekend.   We were notified by the police when they asked Dr. Darnell Level to sign the death certificate  She has a follow up appointment scheduled for 09/29/21    She was not a cancer patient.

## 2021-06-23 DEATH — deceased

## 2021-09-29 ENCOUNTER — Ambulatory Visit (INDEPENDENT_AMBULATORY_CARE_PROVIDER_SITE_OTHER): Payer: 59 | Admitting: Surgery
